# Patient Record
Sex: Male | Born: 1945 | Race: White | Hispanic: No | Marital: Single | Smoking: Former smoker
Health system: Southern US, Community
[De-identification: ages and names within clinical notes are randomized; demographics above are authoritative.]

## PROBLEM LIST (undated history)

## (undated) DIAGNOSIS — M1990 Unspecified osteoarthritis, unspecified site: Secondary | ICD-10-CM

## (undated) DIAGNOSIS — M545 Low back pain, unspecified: Secondary | ICD-10-CM

## (undated) DIAGNOSIS — R296 Repeated falls: Secondary | ICD-10-CM

## (undated) DIAGNOSIS — F329 Major depressive disorder, single episode, unspecified: Secondary | ICD-10-CM

## (undated) DIAGNOSIS — I2510 Atherosclerotic heart disease of native coronary artery without angina pectoris: Secondary | ICD-10-CM

## (undated) DIAGNOSIS — E119 Type 2 diabetes mellitus without complications: Secondary | ICD-10-CM

## (undated) DIAGNOSIS — I509 Heart failure, unspecified: Secondary | ICD-10-CM

## (undated) DIAGNOSIS — I639 Cerebral infarction, unspecified: Secondary | ICD-10-CM

## (undated) DIAGNOSIS — J189 Pneumonia, unspecified organism: Secondary | ICD-10-CM

## (undated) DIAGNOSIS — J449 Chronic obstructive pulmonary disease, unspecified: Secondary | ICD-10-CM

## (undated) DIAGNOSIS — I10 Essential (primary) hypertension: Secondary | ICD-10-CM

## (undated) DIAGNOSIS — Z87891 Personal history of nicotine dependence: Secondary | ICD-10-CM

## (undated) DIAGNOSIS — F1010 Alcohol abuse, uncomplicated: Secondary | ICD-10-CM

## (undated) DIAGNOSIS — I6529 Occlusion and stenosis of unspecified carotid artery: Secondary | ICD-10-CM

## (undated) DIAGNOSIS — K219 Gastro-esophageal reflux disease without esophagitis: Secondary | ICD-10-CM

## (undated) DIAGNOSIS — C50911 Malignant neoplasm of unspecified site of right female breast: Secondary | ICD-10-CM

## (undated) DIAGNOSIS — N289 Disorder of kidney and ureter, unspecified: Secondary | ICD-10-CM

## (undated) DIAGNOSIS — E785 Hyperlipidemia, unspecified: Secondary | ICD-10-CM

## (undated) DIAGNOSIS — E039 Hypothyroidism, unspecified: Secondary | ICD-10-CM

## (undated) DIAGNOSIS — Z9289 Personal history of other medical treatment: Secondary | ICD-10-CM

## (undated) DIAGNOSIS — IMO0001 Reserved for inherently not codable concepts without codable children: Secondary | ICD-10-CM

## (undated) DIAGNOSIS — Z9981 Dependence on supplemental oxygen: Secondary | ICD-10-CM

## (undated) DIAGNOSIS — G709 Myoneural disorder, unspecified: Secondary | ICD-10-CM

## (undated) DIAGNOSIS — K632 Fistula of intestine: Secondary | ICD-10-CM

## (undated) DIAGNOSIS — N281 Cyst of kidney, acquired: Secondary | ICD-10-CM

## (undated) DIAGNOSIS — I4819 Other persistent atrial fibrillation: Secondary | ICD-10-CM

## (undated) DIAGNOSIS — K5900 Constipation, unspecified: Secondary | ICD-10-CM

## (undated) DIAGNOSIS — I5032 Chronic diastolic (congestive) heart failure: Secondary | ICD-10-CM

## (undated) DIAGNOSIS — I272 Pulmonary hypertension, unspecified: Secondary | ICD-10-CM

## (undated) DIAGNOSIS — G4733 Obstructive sleep apnea (adult) (pediatric): Secondary | ICD-10-CM

## (undated) DIAGNOSIS — F32A Depression, unspecified: Secondary | ICD-10-CM

## (undated) DIAGNOSIS — M109 Gout, unspecified: Secondary | ICD-10-CM

## (undated) HISTORY — PX: CORONARY ARTERY BYPASS GRAFT: SHX141

## (undated) HISTORY — DX: Essential (primary) hypertension: I10

## (undated) HISTORY — DX: Low back pain: M54.5

## (undated) HISTORY — DX: Atherosclerotic heart disease of native coronary artery without angina pectoris: I25.10

## (undated) HISTORY — DX: Type 2 diabetes mellitus without complications: E11.9

## (undated) HISTORY — DX: Personal history of other medical treatment: Z92.89

## (undated) HISTORY — DX: Pulmonary hypertension, unspecified: I27.20

## (undated) HISTORY — DX: Obstructive sleep apnea (adult) (pediatric): G47.33

## (undated) HISTORY — DX: Hyperlipidemia, unspecified: E78.5

## (undated) HISTORY — DX: Occlusion and stenosis of unspecified carotid artery: I65.29

## (undated) HISTORY — PX: COLONOSCOPY: SHX174

## (undated) HISTORY — PX: EYE SURGERY: SHX253

## (undated) HISTORY — DX: Low back pain, unspecified: M54.50

## (undated) HISTORY — DX: Chronic obstructive pulmonary disease, unspecified: J44.9

## (undated) HISTORY — DX: Hypothyroidism, unspecified: E03.9

## (undated) HISTORY — PX: HERNIA REPAIR: SHX51

---

## 1999-11-14 ENCOUNTER — Inpatient Hospital Stay (HOSPITAL_COMMUNITY): Admission: EM | Admit: 1999-11-14 | Discharge: 1999-11-19 | Payer: Self-pay | Admitting: Internal Medicine

## 2000-08-17 ENCOUNTER — Encounter: Payer: Self-pay | Admitting: Emergency Medicine

## 2000-08-17 ENCOUNTER — Inpatient Hospital Stay (HOSPITAL_COMMUNITY): Admission: EM | Admit: 2000-08-17 | Discharge: 2000-08-25 | Payer: Self-pay | Admitting: Emergency Medicine

## 2000-08-18 ENCOUNTER — Encounter: Payer: Self-pay | Admitting: Surgery

## 2000-08-19 ENCOUNTER — Encounter: Payer: Self-pay | Admitting: Surgery

## 2000-08-20 ENCOUNTER — Encounter: Payer: Self-pay | Admitting: Surgery

## 2000-08-21 ENCOUNTER — Encounter: Payer: Self-pay | Admitting: Thoracic Surgery (Cardiothoracic Vascular Surgery)

## 2000-10-11 HISTORY — PX: CAROTID ENDARTERECTOMY: SUR193

## 2001-08-29 ENCOUNTER — Encounter: Payer: Self-pay | Admitting: Vascular Surgery

## 2001-08-31 ENCOUNTER — Encounter (INDEPENDENT_AMBULATORY_CARE_PROVIDER_SITE_OTHER): Payer: Self-pay | Admitting: *Deleted

## 2001-08-31 ENCOUNTER — Inpatient Hospital Stay (HOSPITAL_COMMUNITY): Admission: RE | Admit: 2001-08-31 | Discharge: 2001-09-01 | Payer: Self-pay | Admitting: Vascular Surgery

## 2003-02-07 ENCOUNTER — Emergency Department (HOSPITAL_COMMUNITY): Admission: EM | Admit: 2003-02-07 | Discharge: 2003-02-07 | Payer: Self-pay | Admitting: Emergency Medicine

## 2003-02-07 ENCOUNTER — Encounter: Payer: Self-pay | Admitting: Emergency Medicine

## 2005-06-10 ENCOUNTER — Ambulatory Visit: Payer: Self-pay | Admitting: Cardiology

## 2007-06-08 ENCOUNTER — Ambulatory Visit: Payer: Self-pay | Admitting: Cardiology

## 2008-06-04 ENCOUNTER — Ambulatory Visit: Payer: Self-pay | Admitting: Cardiology

## 2008-07-05 ENCOUNTER — Ambulatory Visit: Payer: Self-pay

## 2008-07-25 ENCOUNTER — Encounter: Admission: RE | Admit: 2008-07-25 | Discharge: 2008-07-25 | Payer: Self-pay | Admitting: Neurology

## 2008-07-25 ENCOUNTER — Ambulatory Visit: Payer: Self-pay | Admitting: Cardiology

## 2008-07-25 LAB — CONVERTED CEMR LAB
BUN: 38 mg/dL — ABNORMAL HIGH (ref 6–23)
CO2: 33 meq/L — ABNORMAL HIGH (ref 19–32)
Calcium: 9.4 mg/dL (ref 8.4–10.5)
Chloride: 95 meq/L — ABNORMAL LOW (ref 96–112)
Creatinine, Ser: 0.9 mg/dL (ref 0.4–1.5)
GFR calc Af Amer: 109 mL/min
GFR calc non Af Amer: 90 mL/min
Glucose, Bld: 180 mg/dL — ABNORMAL HIGH (ref 70–99)
Potassium: 4 meq/L (ref 3.5–5.1)
Sodium: 139 meq/L (ref 135–145)

## 2008-08-01 ENCOUNTER — Ambulatory Visit: Payer: Self-pay | Admitting: Cardiology

## 2008-11-20 ENCOUNTER — Ambulatory Visit: Payer: Self-pay | Admitting: Cardiovascular Disease

## 2008-11-20 ENCOUNTER — Ambulatory Visit: Payer: Self-pay | Admitting: Infectious Disease

## 2008-11-20 ENCOUNTER — Inpatient Hospital Stay (HOSPITAL_COMMUNITY): Admission: EM | Admit: 2008-11-20 | Discharge: 2008-11-25 | Payer: Self-pay | Admitting: Emergency Medicine

## 2008-11-20 ENCOUNTER — Ambulatory Visit: Payer: Self-pay | Admitting: Vascular Surgery

## 2008-11-21 ENCOUNTER — Encounter: Payer: Self-pay | Admitting: Infectious Disease

## 2009-06-20 DIAGNOSIS — I2510 Atherosclerotic heart disease of native coronary artery without angina pectoris: Secondary | ICD-10-CM

## 2009-06-20 DIAGNOSIS — E785 Hyperlipidemia, unspecified: Secondary | ICD-10-CM

## 2009-06-20 DIAGNOSIS — I6529 Occlusion and stenosis of unspecified carotid artery: Secondary | ICD-10-CM

## 2009-06-20 DIAGNOSIS — E1165 Type 2 diabetes mellitus with hyperglycemia: Secondary | ICD-10-CM

## 2009-06-20 DIAGNOSIS — E119 Type 2 diabetes mellitus without complications: Secondary | ICD-10-CM

## 2009-06-20 DIAGNOSIS — G4733 Obstructive sleep apnea (adult) (pediatric): Secondary | ICD-10-CM

## 2009-06-20 DIAGNOSIS — I10 Essential (primary) hypertension: Secondary | ICD-10-CM

## 2009-06-20 DIAGNOSIS — E1151 Type 2 diabetes mellitus with diabetic peripheral angiopathy without gangrene: Secondary | ICD-10-CM

## 2009-06-20 DIAGNOSIS — J4489 Other specified chronic obstructive pulmonary disease: Secondary | ICD-10-CM | POA: Insufficient documentation

## 2009-06-20 DIAGNOSIS — J449 Chronic obstructive pulmonary disease, unspecified: Secondary | ICD-10-CM

## 2009-06-20 DIAGNOSIS — I119 Hypertensive heart disease without heart failure: Secondary | ICD-10-CM

## 2009-06-20 HISTORY — DX: Type 2 diabetes mellitus without complications: E11.9

## 2009-06-20 HISTORY — DX: Other specified chronic obstructive pulmonary disease: J44.89

## 2009-06-20 HISTORY — DX: Obstructive sleep apnea (adult) (pediatric): G47.33

## 2009-06-20 HISTORY — DX: Chronic obstructive pulmonary disease, unspecified: J44.9

## 2009-06-20 HISTORY — DX: Hyperlipidemia, unspecified: E78.5

## 2009-06-20 HISTORY — DX: Atherosclerotic heart disease of native coronary artery without angina pectoris: I25.10

## 2009-06-20 HISTORY — DX: Occlusion and stenosis of unspecified carotid artery: I65.29

## 2009-06-20 HISTORY — DX: Essential (primary) hypertension: I10

## 2009-06-24 ENCOUNTER — Ambulatory Visit: Payer: Self-pay | Admitting: Cardiology

## 2009-07-01 ENCOUNTER — Telehealth (INDEPENDENT_AMBULATORY_CARE_PROVIDER_SITE_OTHER): Payer: Self-pay | Admitting: *Deleted

## 2009-07-02 ENCOUNTER — Encounter: Payer: Self-pay | Admitting: Cardiology

## 2009-07-02 ENCOUNTER — Ambulatory Visit: Payer: Self-pay

## 2009-07-08 ENCOUNTER — Ambulatory Visit: Payer: Self-pay

## 2009-07-08 ENCOUNTER — Encounter: Payer: Self-pay | Admitting: Cardiology

## 2009-07-08 ENCOUNTER — Ambulatory Visit: Payer: Self-pay | Admitting: Cardiology

## 2009-07-16 ENCOUNTER — Encounter (INDEPENDENT_AMBULATORY_CARE_PROVIDER_SITE_OTHER): Payer: Self-pay | Admitting: *Deleted

## 2009-08-26 ENCOUNTER — Ambulatory Visit: Payer: Self-pay | Admitting: Cardiology

## 2009-08-27 ENCOUNTER — Telehealth: Payer: Self-pay | Admitting: Cardiology

## 2009-09-11 ENCOUNTER — Ambulatory Visit: Payer: Self-pay | Admitting: Cardiology

## 2009-09-11 ENCOUNTER — Encounter: Payer: Self-pay | Admitting: Cardiology

## 2009-09-11 ENCOUNTER — Ambulatory Visit (HOSPITAL_COMMUNITY): Admission: RE | Admit: 2009-09-11 | Discharge: 2009-09-11 | Payer: Self-pay | Admitting: Cardiology

## 2009-09-11 ENCOUNTER — Ambulatory Visit: Payer: Self-pay

## 2009-09-19 ENCOUNTER — Encounter (INDEPENDENT_AMBULATORY_CARE_PROVIDER_SITE_OTHER): Payer: Self-pay | Admitting: *Deleted

## 2009-12-23 ENCOUNTER — Ambulatory Visit: Payer: Self-pay | Admitting: Cardiology

## 2009-12-23 DIAGNOSIS — I5032 Chronic diastolic (congestive) heart failure: Secondary | ICD-10-CM

## 2010-04-05 ENCOUNTER — Ambulatory Visit: Payer: Self-pay | Admitting: Surgery

## 2010-04-05 ENCOUNTER — Inpatient Hospital Stay (HOSPITAL_COMMUNITY): Admission: EM | Admit: 2010-04-05 | Discharge: 2010-04-09 | Payer: Self-pay | Admitting: Emergency Medicine

## 2010-04-06 ENCOUNTER — Encounter (INDEPENDENT_AMBULATORY_CARE_PROVIDER_SITE_OTHER): Payer: Self-pay | Admitting: Internal Medicine

## 2010-04-11 ENCOUNTER — Emergency Department (HOSPITAL_COMMUNITY): Admission: EM | Admit: 2010-04-11 | Discharge: 2010-04-11 | Payer: Self-pay | Admitting: Emergency Medicine

## 2010-05-26 ENCOUNTER — Ambulatory Visit: Payer: Self-pay | Admitting: Cardiology

## 2010-06-25 ENCOUNTER — Inpatient Hospital Stay (HOSPITAL_COMMUNITY): Admission: AD | Admit: 2010-06-25 | Discharge: 2010-06-28 | Payer: Self-pay | Admitting: Internal Medicine

## 2010-06-25 ENCOUNTER — Ambulatory Visit: Payer: Self-pay | Admitting: Internal Medicine

## 2010-07-07 ENCOUNTER — Encounter: Payer: Self-pay | Admitting: Cardiology

## 2010-07-07 ENCOUNTER — Ambulatory Visit: Payer: Self-pay

## 2010-11-08 LAB — CONVERTED CEMR LAB
BUN: 48 mg/dL — ABNORMAL HIGH (ref 6–23)
CO2: 34 meq/L — ABNORMAL HIGH (ref 19–32)
Creatinine, Ser: 1.1 mg/dL (ref 0.4–1.5)
GFR calc non Af Amer: 71.17 mL/min (ref >60)
Glucose, Bld: 224 mg/dL — ABNORMAL HIGH (ref 70–99)

## 2010-11-10 NOTE — Assessment & Plan Note (Signed)
Summary: 4 MONTH   Primary Provider:  dr [name]  CC:  sob.  History of Present Illness: Patient is 65 years old and return for management of CAD and diastolic heart failure. He had bypass surgery in 2001. I saw him last year with new onset atrial fibrillation. We could him with rate control without Coumadin since he had a history of alcohol consumption. He is [place] score 2 with hypertension and diabetes. He may be [place] score 3 with diastolic CHF. He had a negative Myoview in 2010. His normal LV function with an ejection fraction of 55-60%.  He's been doing fair. He's had no chest pain and no palpitations. He has had some increased edema of lower extremities and he says he gives out very easily with walking. He didn't feel he could get up to the table today to be examined.  His wife died a couple of years ago and is now living by himself.  His other medical problems include diabetes, hypertension, hyperlipidemia, and obstructive sleep apnea. He also has obesity and he is status post left carotid endarterectomy.  Current Medications (verified): 1)  Aspirin Ec 325 Mg Tbec (Aspirin) .... Take One Tablet By Mouth Daily 2)  Prilosec 20 Mg Cpdr (Omeprazole) .Marland Kitchen.. 1 Tab Once Daily 3)  Prozac 20 Mg Caps (Fluoxetine Hcl) .Marland Kitchen.. 1 Tab Once Daily 4)  Crestor 20 Mg Tabs (Rosuvastatin Calcium) .... Take One Tablet By Mouth Daily. 5)  Glipizide 10 Mg Tabs (Glipizide) .Marland Kitchen.. 1 Tab By Mouth Two Times A Day 6)  Metoprolol Tartrate 50 Mg Tabs (Metoprolol Tartrate) .Marland Kitchen.. 1 Tab Two Times A Day 7)  Synthroid 150 Mcg Tabs (Levothyroxine Sodium) .Marland Kitchen.. 1 Tab Once Daily 8)  Combivent 103-18 Mcg/act Aero (Ipratropium-Albuterol) .Marland Kitchen.. 1-2 Vpuffs Up To Four Times A Day 9)  Potassium Chloride Crys Cr 20 Meq Cr-Tabs (Potassium Chloride Crys Cr) .... Take One Tablet By Mouth Daily 10)  Zaroxolyn 5 Mg Tabs (Metolazone) .... Two Tabs By Mouth Every Morning 11)  Spironolactone 50 Mg Tabs (Spironolactone) .... One Tab By Mouth  Once Daily 12)  Allopurinol 300 Mg Tabs (Allopurinol) .... One Tab By Mouth Once Daily 13)  Gabapentin 100 Mg Caps (Gabapentin) .Marland Kitchen.. 1 Tab By Mouth Three Times A Day 14)  Magnesium Oxide 400 Mg Tabs (Magnesium Oxide) .... One Tab By Mouth Once Daily 15)  Furosemide 80 Mg Tabs (Furosemide) .... Take Two  Tablet By Mouth Daily. 16)  Aerobid 250 Mcg/act Aers (Flunisolide) .... One Puff Two Times A Day  Allergies: 1)  ! * Mycians  Past History:  Past Medical History: Reviewed history from 06/23/2009 and no changes required.  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 Coronary artery disease status post coronary artery bypass graft     during 2001, stable. 2. Good left ventricular function. 3. Excess weight, but improvement with a loss of 48 pounds. 4. Non-insulin-dependent diabetes. 5. Hypertension. 6. Hyperlipidemia with intolerance to statins. 7. Obstructive sleep apnea. 8. Hypothyroidism, treated. 9. Low back pain with radicular pain and weakness in the left lower     extremity. 10.Status post left carotid endarterectomy, 2002.   Review of Systems       ROS is negative except as outlined in HPI.   Vital Signs:  Patient profile:   65 year old male Height:      70 inches Weight:      305 pounds BMI:  43.92 Pulse rate:   71 / minute Resp:     18 per minute BP sitting:   139 / 84  (left arm)  Vitals Entered By: Kem Parkinson (December 23, 2009 1:49 PM)  Physical Exam  Additional Exam:  Gen. Well-nourished, in no distress   Neck: JVP just above the clavicle, thyroid not enlarged, no carotid bruits Lungs: No tachypnea, clear without rales, rhonchi or wheezes Cardiovascular: Rhythm irregular, PMI not displaced,  heart sounds  normal, no murmurs or gallops, 2-3+ bilateral peripheral edema with stasis changes,  pulses normal in all 4 extremities. Abdomen: BS normal, abdomen soft and non-tender without masses or organomegaly, no hepatosplenomegaly. MS: No deformities, no cyanosis or clubbing   Neuro:  No focal sns   Skin:  no lesions    Impression & Recommendations:  Problem # 1:  CAD (ICD-414.00)  Hhad previous bypass surgery in 2001 and had a negative Myoview scan in 2010 this problem appears stable. His updated medication list for this problem includes:    Aspirin Ec 325 Mg Tbec (Aspirin) .Marland Kitchen... Take one tablet by mouth daily    Metoprolol Tartrate 50 Mg Tabs (Metoprolol tartrate) .Marland Kitchen... 1 tab two times a day  Orders: EKG w/ Interpretation (93000) TLB-BMP (Basic Metabolic Panel-BMET) (80048-METABOL)  Problem # 2:  ATRIAL FIBRILLATION (ICD-427.31)  He has now chronic atrial fibrillation and is treated with rate control only. His rate control appears good. He is not on Coumadin because of concerns about compliance with a history of alcohol consumption. He is [place] score 2-3 His updated medication list for this problem includes:    Aspirin Ec 325 Mg Tbec (Aspirin) .Marland Kitchen... Take one tablet by mouth daily    Metoprolol Tartrate 50 Mg Tabs (Metoprolol tartrate) .Marland Kitchen... 1 tab two times a day  His updated medication list for this problem includes:    Aspirin Ec 325 Mg Tbec (Aspirin) .Marland Kitchen... Take one tablet by mouth daily    Metoprolol Tartrate 50 Mg Tabs (Metoprolol tartrate) .Marland Kitchen... 1 tab two times a day  Orders: EKG w/ Interpretation (93000) TLB-BMP (Basic Metabolic Panel-BMET) (80048-METABOL)  Problem # 3:  HYPERTENSION (ICD-401.9)  This is fairly well controlled on current medications. His updated medication list for this problem includes:    Aspirin Ec 325 Mg Tbec (Aspirin) .Marland Kitchen... Take one tablet by mouth daily    Metoprolol Tartrate 50 Mg Tabs (Metoprolol tartrate) .Marland Kitchen... 1 tab two times a day    Zaroxolyn 5 Mg Tabs (Metolazone) .Marland Kitchen..Marland Kitchen Two tabs by mouth every morning    Spironolactone 50 Mg Tabs  (Spironolactone) ..... One tab by mouth once daily    Furosemide 80 Mg Tabs (Furosemide) .Marland Kitchen... Take two tablets every morning and one tablet every evening.  His updated medication list for this problem includes:    Aspirin Ec 325 Mg Tbec (Aspirin) .Marland Kitchen... Take one tablet by mouth daily    Metoprolol Tartrate 50 Mg Tabs (Metoprolol tartrate) .Marland Kitchen... 1 tab two times a day    Zaroxolyn 5 Mg Tabs (Metolazone) .Marland Kitchen..Marland Kitchen Two tabs by mouth every morning    Spironolactone 50 Mg Tabs (Spironolactone) ..... One tab by mouth once daily    Furosemide 80 Mg Tabs (Furosemide) .Marland Kitchen... Take two tablets every morning and one tablet every evening.  Orders: EKG w/ Interpretation (93000) TLB-BMP (Basic Metabolic Panel-BMET) (80048-METABOL)  Problem # 4:  DIASTOLIC HEART FAILURE, CHRONIC (ICD-428.32)  He has some element of diastolic heart failure with borderline JVD and edema. I think most of his edema is  related to venous insufficiency. We will increase his dose tonight from 80 mg 2 tablets in the morning to 80 mg 2 tablets the morning and one tablet in the afternoon. We'll get a BMP today and a repeat one in a week and salicylate. His updated medication list for this problem includes:    Aspirin Ec 325 Mg Tbec (Aspirin) .Marland Kitchen... Take one tablet by mouth daily    Metoprolol Tartrate 50 Mg Tabs (Metoprolol tartrate) .Marland Kitchen... 1 tab two times a day    Zaroxolyn 5 Mg Tabs (Metolazone) .Marland Kitchen..Marland Kitchen Two tabs by mouth every morning    Spironolactone 50 Mg Tabs (Spironolactone) ..... One tab by mouth once daily    Furosemide 80 Mg Tabs (Furosemide) .Marland Kitchen... Take two tablets every morning and one tablet every evening.  Orders: EKG w/ Interpretation (93000) TLB-BMP (Basic Metabolic Panel-BMET) (80048-METABOL)  Patient Instructions: 1)  Your physician recommends that you have lab work today: bmet (427.31; 414.01;428.33;402.10) 2)  You need to have repeat labwork in one week at your primary care in [facility]- bmet  (427.31;414.01;428.33;402.10) 3)  Your physician has recommended you make the following change in your medication: 1) Increase furosemide to 80mg  two tablets every morning and one tablet every evening. 4)  Your physician wants you to follow-up in: 6 months.  You will receive a reminder letter in the mail two months in advance. If you don't receive a letter, please call our office to schedule the follow-up appointment.

## 2010-11-10 NOTE — Assessment & Plan Note (Signed)
Summary: f37m   Visit Type:  Follow-up Primary Provider:  dr [name]  CC:  The pt was admitted to [facility] about 2 months ago for chills/ headaches/ and cellulitis. He has had decreased edema..  History of Present Illness: The patient is 65 years old and returns for management of CAD. He had bypass surgery in 2001. He had a negative Myoview scan in 2010. In 2010 he developed paroxysmal fibrillation and in persistent atrial fibrillation. He was not felt to be a Coumadin candidate because of alcohol consumption. He did have a [place] score 3.  About 6 weeks ago he was hospitalized with cellulitis and what sounds like sepsis. He also had a lot of edema and shortness of breath which was thought to be related to multiple factors including COPD venous insufficiency and diastolic CHF. He improved with treatment. He was on Zaroxolyn at home but his weight has come down and he is now off Zaroxolyn and maintaining his weight just on furosemide. His creatinine in the hospital was 1.2-1.6. He said his head followup readings at Dr. Bobbye Charleston office.  Current Medications (verified): 1)  Aspirin Ec 325 Mg Tbec (Aspirin) .... Take One Tablet By Mouth Daily 2)  Prilosec 20 Mg Cpdr (Omeprazole) .Marland Kitchen.. 1 Tab Once Daily 3)  Prozac 20 Mg Caps (Fluoxetine Hcl) .Marland Kitchen.. 1 Tab Once Daily 4)  Crestor 20 Mg Tabs (Rosuvastatin Calcium) .... Take One Tablet By Mouth Daily. 5)  Glipizide 10 Mg Tabs (Glipizide) .Marland Kitchen.. 1 Tab By Mouth Two Times A Day 6)  Metoprolol Tartrate 50 Mg Tabs (Metoprolol Tartrate) .Marland Kitchen.. 1 Tab Two Times A Day 7)  Synthroid 150 Mcg Tabs (Levothyroxine Sodium) .Marland Kitchen.. 1 Tab Once Daily 8)  Albuterol Nebs .... Use One Nebulizer Treatment Once Daily 9)  Potassium Chloride Crys Cr 20 Meq Cr-Tabs (Potassium Chloride Crys Cr) .... Take One Tablet By Mouth Daily 10)  Spironolactone 50 Mg Tabs (Spironolactone) .... One Tab By Mouth Once Daily 11)  Allopurinol 300 Mg Tabs (Allopurinol) .... One Tab By Mouth Once Daily 12)   Gabapentin 100 Mg Caps (Gabapentin) .Marland Kitchen.. 1 Tab By Mouth Three Times A Day 13)  Magnesium Oxide 400 Mg Tabs (Magnesium Oxide) .... One Tab By Mouth Once Daily 14)  Furosemide 80 Mg Tabs (Furosemide) .... Take Two Tablets Every Morning 15)  Zaroxolyn 5 Mg Tabs (Metolazone) .... One Tab By Mouth Once Daily As Needed  Allergies (verified): 1)  ! * Mycians  Past History:  Past Medical History: Reviewed history from 06/23/2009 and no changes required.  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 Coronary artery disease status post coronary artery bypass graft     during 2001, stable. 2. Good left ventricular function. 3. Excess weight, but improvement with a loss of 48 pounds. 4. Non-insulin-dependent diabetes. 5. Hypertension. 6. Hyperlipidemia with intolerance to statins. 7. Obstructive sleep apnea. 8. Hypothyroidism, treated. 9. Low back pain with radicular pain and weakness in the left lower     extremity. 10.Status post left carotid endarterectomy, 2002.   Review of Systems       ROS is negative except as outlined in HPI.   Vital Signs:  Patient profile:   65 year old male Height:      70 inches Weight:      298.25 pounds Pulse rate:   66 / minute BP sitting:   101 / 54  (left arm) Cuff size:  large  Vitals Entered By: Burnett Kanaris, CNA (May 26, 2010 2:42 PM)  Physical Exam  Additional Exam:  Gen. Well-nourished, in no distress   Neck: No JVD, thyroid not enlarged, right carotid bruits Lungs: No tachypnea, clear without rales, rhonchi or wheezes Cardiovascular: Rhythm regular, PMI not displaced,  heart sounds  normal, no murmurs or gallops, trace bilateral peripheral edema with stasis changes, pulses normal in all 4 extremities. Abdomen: BS normal, abdomen soft and non-tender without masses or  organomegaly, no hepatosplenomegaly. MS: No deformities, no cyanosis or clubbing   Neuro:  No focal sns   Skin:  no lesions    Impression & Recommendations:  Problem # 1:  DIASTOLIC HEART FAILURE, CHRONIC (ICD-428.32) He appears euvolemic today we will continue his current therapy. His primary care physician has checked his renal function. The following medications were removed from the medication list:    Zaroxolyn 5 Mg Tabs (Metolazone) .Marland Kitchen..Marland Kitchen Two tabs by mouth every morning His updated medication list for this problem includes:    Aspirin Ec 325 Mg Tbec (Aspirin) .Marland Kitchen... Take one tablet by mouth daily    Metoprolol Tartrate 50 Mg Tabs (Metoprolol tartrate) .Marland Kitchen... 1 tab two times a day    Spironolactone 50 Mg Tabs (Spironolactone) ..... One tab by mouth once daily    Furosemide 80 Mg Tabs (Furosemide) .Marland Kitchen... Take two tablets every morning    Zaroxolyn 5 Mg Tabs (Metolazone) ..... One tab by mouth once daily as needed  Problem # 2:  ATRIAL FIBRILLATION (ICD-427.31)  He has chronic atrial fibrillation. He is [place] score 3 with hypertension, diabetes, and CHF. We have not put him on Coumadin because of a history of alcohol. He says he is off alcohol now. We discussed the possibility of treating him with Coumadin but he was unable to arrive at a decision today. If [name] feels he can reliably control his INR is and if he stays off alcohol, I think there would be benefit using Coumadin to reduce his stroke risk. His updated medication list for this problem includes:    Aspirin Ec 325 Mg Tbec (Aspirin) .Marland Kitchen... Take one tablet by mouth daily    Metoprolol Tartrate 50 Mg Tabs (Metoprolol tartrate) .Marland Kitchen... 1 tab two times a day  Orders: EKG w/ Interpretation (93000)  Problem # 3:  CAD (ICD-414.00)  He had remote bypass surgery and had a negative Myoview scan last year. He's had no chest pain this problem appears stable. His updated medication list for this problem includes:    Aspirin Ec 325 Mg  Tbec (Aspirin) .Marland Kitchen... Take one tablet by mouth daily    Metoprolol Tartrate 50 Mg Tabs (Metoprolol tartrate) .Marland Kitchen... 1 tab two times a day  Orders: EKG w/ Interpretation (93000)  Problem # 4:  CAROTID STENOSIS (ICD-433.10) He's had previous left carotid endarterectomy and has a right bruit. He is under surveillance with carotid duplex studies.  his last study in September of 2010 showed 46% stenosis on the right and 0-39 on the left. His updated medication list for this problem includes:    Aspirin Ec 325 Mg Tbec (Aspirin) .Marland Kitchen... Take one tablet by mouth daily  Patient Instructions: 1)  Your physician recommends that you continue on your current medications as directed. Please refer to the Current Medication list given to you today. 2)  Your physician wants you to follow-up in: 6 months with Dr. Clifton James.   You will receive a reminder letter in the mail two months in advance. If you  don't receive a letter, please call our office to schedule the follow-up appointment.

## 2010-11-10 NOTE — Miscellaneous (Signed)
Summary: Orders Update  Clinical Lists Changes  Orders: Added new Test order of Carotid Duplex (Carotid Duplex) - Signed 

## 2010-12-24 LAB — DIFFERENTIAL
Basophils Absolute: 0 10*3/uL (ref 0.0–0.1)
Eosinophils Relative: 1 % (ref 0–5)
Lymphs Abs: 1 10*3/uL (ref 0.7–4.0)
Neutrophils Relative %: 81 % — ABNORMAL HIGH (ref 43–77)

## 2010-12-24 LAB — BASIC METABOLIC PANEL
BUN: 66 mg/dL — ABNORMAL HIGH (ref 6–23)
CO2: 24 mEq/L (ref 19–32)
CO2: 31 mEq/L (ref 19–32)
CO2: 33 mEq/L — ABNORMAL HIGH (ref 19–32)
Calcium: 9.4 mg/dL (ref 8.4–10.5)
Chloride: 91 mEq/L — ABNORMAL LOW (ref 96–112)
Chloride: 92 mEq/L — ABNORMAL LOW (ref 96–112)
Chloride: 92 mEq/L — ABNORMAL LOW (ref 96–112)
Creatinine, Ser: 1.3 mg/dL (ref 0.4–1.5)
Creatinine, Ser: 1.4 mg/dL (ref 0.4–1.5)
GFR calc Af Amer: >60 mL/min (ref >60)
GFR calc Af Amer: >60 mL/min (ref >60)
Glucose, Bld: 195 mg/dL — ABNORMAL HIGH (ref 70–99)
Potassium: 4 mEq/L (ref 3.5–5.1)
Potassium: 4.8 mEq/L (ref 3.5–5.1)
Sodium: 129 mEq/L — ABNORMAL LOW (ref 135–145)
Sodium: 137 mEq/L (ref 135–145)

## 2010-12-24 LAB — GLUCOSE, CAPILLARY
Glucose-Capillary: 168 mg/dL — ABNORMAL HIGH (ref 70–99)
Glucose-Capillary: 180 mg/dL — ABNORMAL HIGH (ref 70–99)
Glucose-Capillary: 194 mg/dL — ABNORMAL HIGH (ref 70–99)
Glucose-Capillary: 202 mg/dL — ABNORMAL HIGH (ref 70–99)
Glucose-Capillary: 222 mg/dL — ABNORMAL HIGH (ref 70–99)
Glucose-Capillary: 235 mg/dL — ABNORMAL HIGH (ref 70–99)
Glucose-Capillary: 298 mg/dL — ABNORMAL HIGH (ref 70–99)

## 2010-12-24 LAB — CULTURE, BLOOD (ROUTINE X 2)
Culture  Setup Time: 201109160136
Culture: NO GROWTH

## 2010-12-24 LAB — CBC
Hemoglobin: 12.4 g/dL — ABNORMAL LOW (ref 13.0–17.0)
MCH: 31 pg (ref 26.0–34.0)
MCHC: 32 g/dL (ref 30.0–36.0)
MCHC: 32.2 g/dL (ref 30.0–36.0)
MCV: 96.8 fL (ref 78.0–100.0)
RBC: 3.75 MIL/uL — ABNORMAL LOW (ref 4.22–5.81)
RBC: 4 MIL/uL — ABNORMAL LOW (ref 4.22–5.81)
RDW: 15.5 % (ref 11.5–15.5)

## 2010-12-24 LAB — COMPREHENSIVE METABOLIC PANEL
ALT: 25 U/L (ref 0–53)
AST: 30 U/L (ref 0–37)
Alkaline Phosphatase: 63 U/L (ref 39–117)
Calcium: 9.2 mg/dL (ref 8.4–10.5)
GFR calc Af Amer: 58 mL/min — ABNORMAL LOW (ref >60)

## 2010-12-24 LAB — LIPID PANEL
HDL: 24 mg/dL — ABNORMAL LOW (ref >39)
Total CHOL/HDL Ratio: 6 RATIO
VLDL: 30 mg/dL (ref 0–40)

## 2010-12-24 LAB — HEMOGLOBIN A1C: Mean Plasma Glucose: 160 mg/dL — ABNORMAL HIGH (ref <117)

## 2010-12-24 LAB — VANCOMYCIN, TROUGH: Vancomycin Tr: 24.5 ug/mL — ABNORMAL HIGH (ref 10.0–20.0)

## 2010-12-27 LAB — GLUCOSE, CAPILLARY: Glucose-Capillary: 127 mg/dL — ABNORMAL HIGH (ref 70–99)

## 2010-12-27 LAB — DIFFERENTIAL
Basophils Absolute: 0 10*3/uL (ref 0.0–0.1)
Basophils Relative: 0 % (ref 0–1)
Neutro Abs: 6.6 10*3/uL (ref 1.7–7.7)
Neutrophils Relative %: 72 % (ref 43–77)

## 2010-12-27 LAB — BASIC METABOLIC PANEL
BUN: 29 mg/dL — ABNORMAL HIGH (ref 6–23)
Calcium: 9.5 mg/dL (ref 8.4–10.5)
GFR calc non Af Amer: 59 mL/min — ABNORMAL LOW (ref >60)
Glucose, Bld: 132 mg/dL — ABNORMAL HIGH (ref 70–99)
Sodium: 137 mEq/L (ref 135–145)

## 2010-12-27 LAB — CBC
Hemoglobin: 12.6 g/dL — ABNORMAL LOW (ref 13.0–17.0)
MCHC: 33.4 g/dL (ref 30.0–36.0)
RDW: 15.6 % — ABNORMAL HIGH (ref 11.5–15.5)
WBC: 9.2 10*3/uL (ref 4.0–10.5)

## 2010-12-28 LAB — COMPREHENSIVE METABOLIC PANEL
AST: 30 U/L (ref 0–37)
AST: 33 U/L (ref 0–37)
Albumin: 2.8 g/dL — ABNORMAL LOW (ref 3.5–5.2)
Albumin: 3.1 g/dL — ABNORMAL LOW (ref 3.5–5.2)
CO2: 27 mEq/L (ref 19–32)
Calcium: 9.1 mg/dL (ref 8.4–10.5)
Calcium: 9.7 mg/dL (ref 8.4–10.5)
Chloride: 93 mEq/L — ABNORMAL LOW (ref 96–112)
Creatinine, Ser: 1.27 mg/dL (ref 0.4–1.5)
Creatinine, Ser: 1.65 mg/dL — ABNORMAL HIGH (ref 0.4–1.5)
GFR calc Af Amer: 51 mL/min — ABNORMAL LOW (ref >60)
GFR calc Af Amer: >60 mL/min (ref >60)
GFR calc non Af Amer: 57 mL/min — ABNORMAL LOW (ref >60)
Sodium: 133 mEq/L — ABNORMAL LOW (ref 135–145)
Total Bilirubin: 0.8 mg/dL (ref 0.3–1.2)

## 2010-12-28 LAB — TROPONIN I: Troponin I: 0.02 ng/mL (ref 0.00–0.06)

## 2010-12-28 LAB — GLUCOSE, CAPILLARY
Glucose-Capillary: 107 mg/dL — ABNORMAL HIGH (ref 70–99)
Glucose-Capillary: 127 mg/dL — ABNORMAL HIGH (ref 70–99)
Glucose-Capillary: 139 mg/dL — ABNORMAL HIGH (ref 70–99)
Glucose-Capillary: 150 mg/dL — ABNORMAL HIGH (ref 70–99)
Glucose-Capillary: 163 mg/dL — ABNORMAL HIGH (ref 70–99)
Glucose-Capillary: 181 mg/dL — ABNORMAL HIGH (ref 70–99)
Glucose-Capillary: 183 mg/dL — ABNORMAL HIGH (ref 70–99)
Glucose-Capillary: 192 mg/dL — ABNORMAL HIGH (ref 70–99)
Glucose-Capillary: 222 mg/dL — ABNORMAL HIGH (ref 70–99)
Glucose-Capillary: 279 mg/dL — ABNORMAL HIGH (ref 70–99)
Glucose-Capillary: 295 mg/dL — ABNORMAL HIGH (ref 70–99)

## 2010-12-28 LAB — CK TOTAL AND CKMB (NOT AT ARMC)
CK, MB: 3 ng/mL (ref 0.3–4.0)
CK, MB: 3.3 ng/mL (ref 0.3–4.0)
Relative Index: 3.3 — ABNORMAL HIGH (ref 0.0–2.5)
Relative Index: INVALID (ref 0.0–2.5)
Total CK: 101 U/L (ref 7–232)
Total CK: 73 U/L (ref 7–232)

## 2010-12-28 LAB — BASIC METABOLIC PANEL
BUN: 29 mg/dL — ABNORMAL HIGH (ref 6–23)
BUN: 47 mg/dL — ABNORMAL HIGH (ref 6–23)
CO2: 29 mEq/L (ref 19–32)
Calcium: 8.8 mg/dL (ref 8.4–10.5)
Chloride: 100 mEq/L (ref 96–112)
Creatinine, Ser: 1.04 mg/dL (ref 0.4–1.5)
GFR calc Af Amer: >60 mL/min (ref >60)
GFR calc non Af Amer: 52 mL/min — ABNORMAL LOW (ref >60)
Glucose, Bld: 149 mg/dL — ABNORMAL HIGH (ref 70–99)
Glucose, Bld: 210 mg/dL — ABNORMAL HIGH (ref 70–99)
Potassium: 3.6 mEq/L (ref 3.5–5.1)
Potassium: 4 mEq/L (ref 3.5–5.1)
Sodium: 138 mEq/L (ref 135–145)

## 2010-12-28 LAB — CBC
HCT: 34 % — ABNORMAL LOW (ref 39.0–52.0)
HCT: 36.9 % — ABNORMAL LOW (ref 39.0–52.0)
HCT: 37.5 % — ABNORMAL LOW (ref 39.0–52.0)
Hemoglobin: 11.4 g/dL — ABNORMAL LOW (ref 13.0–17.0)
Hemoglobin: 11.5 g/dL — ABNORMAL LOW (ref 13.0–17.0)
MCH: 33 pg (ref 26.0–34.0)
MCH: 33.2 pg (ref 26.0–34.0)
MCH: 33.3 pg (ref 26.0–34.0)
MCH: 33.5 pg (ref 26.0–34.0)
MCHC: 32.6 g/dL (ref 30.0–36.0)
MCHC: 33.2 g/dL (ref 30.0–36.0)
MCHC: 33.6 g/dL (ref 30.0–36.0)
MCV: 100 fL (ref 78.0–100.0)
Platelets: 126 10*3/uL — ABNORMAL LOW (ref 150–400)
Platelets: 129 10*3/uL — ABNORMAL LOW (ref 150–400)
Platelets: 134 10*3/uL — ABNORMAL LOW (ref 150–400)
Platelets: 136 10*3/uL — ABNORMAL LOW (ref 150–400)
RBC: 3.4 MIL/uL — ABNORMAL LOW (ref 4.22–5.81)
RBC: 3.43 MIL/uL — ABNORMAL LOW (ref 4.22–5.81)
RBC: 3.5 MIL/uL — ABNORMAL LOW (ref 4.22–5.81)
RBC: 3.73 MIL/uL — ABNORMAL LOW (ref 4.22–5.81)
RBC: 4.06 MIL/uL — ABNORMAL LOW (ref 4.22–5.81)
RDW: 15.2 % (ref 11.5–15.5)
RDW: 15.4 % (ref 11.5–15.5)
RDW: 15.4 % (ref 11.5–15.5)
WBC: 25 10*3/uL — ABNORMAL HIGH (ref 4.0–10.5)
WBC: 27.6 10*3/uL — ABNORMAL HIGH (ref 4.0–10.5)
WBC: 7.2 10*3/uL (ref 4.0–10.5)
WBC: 7.7 10*3/uL (ref 4.0–10.5)

## 2010-12-28 LAB — DIFFERENTIAL
Eosinophils Relative: 0 % (ref 0–5)
Eosinophils Relative: 0 % (ref 0–5)
Lymphs Abs: 0.5 10*3/uL — ABNORMAL LOW (ref 0.7–4.0)
Lymphs Abs: 1.1 10*3/uL (ref 0.7–4.0)
Monocytes Relative: 5 % (ref 3–12)
Monocytes Relative: 6 % (ref 3–12)
Neutro Abs: 25.1 10*3/uL — ABNORMAL HIGH (ref 1.7–7.7)

## 2010-12-28 LAB — CULTURE, BLOOD (ROUTINE X 2): Culture: NO GROWTH

## 2010-12-28 LAB — LIPID PANEL
Cholesterol: 114 mg/dL (ref 0–200)
HDL: 38 mg/dL — ABNORMAL LOW (ref >39)
LDL Cholesterol: 51 mg/dL (ref 0–99)
Total CHOL/HDL Ratio: 3 RATIO

## 2010-12-28 LAB — URINALYSIS, ROUTINE W REFLEX MICROSCOPIC
Leukocytes, UA: NEGATIVE
Nitrite: NEGATIVE
Protein, ur: >300 mg/dL — AB
Specific Gravity, Urine: 1.017 (ref 1.005–1.030)
Urobilinogen, UA: 1 mg/dL (ref 0.0–1.0)

## 2010-12-28 LAB — D-DIMER, QUANTITATIVE: D-Dimer, Quant: 0.42 ug/mL-FEU (ref 0.00–0.48)

## 2010-12-28 LAB — TSH: TSH: 1.472 u[IU]/mL (ref 0.350–4.500)

## 2010-12-28 LAB — APTT: aPTT: 26 seconds (ref 24–37)

## 2010-12-28 LAB — ETHANOL: Alcohol, Ethyl (B): <5 mg/dL (ref 0–10)

## 2010-12-28 LAB — URINE MICROSCOPIC-ADD ON

## 2011-01-13 ENCOUNTER — Encounter (HOSPITAL_BASED_OUTPATIENT_CLINIC_OR_DEPARTMENT_OTHER): Payer: Self-pay

## 2011-01-15 ENCOUNTER — Encounter: Payer: Self-pay | Admitting: Cardiovascular Disease

## 2011-01-18 ENCOUNTER — Ambulatory Visit (HOSPITAL_BASED_OUTPATIENT_CLINIC_OR_DEPARTMENT_OTHER): Payer: No Typology Code available for payment source

## 2011-01-19 ENCOUNTER — Ambulatory Visit: Payer: Self-pay | Admitting: Cardiovascular Disease

## 2011-01-26 LAB — DIFFERENTIAL
Basophils Absolute: 0 10*3/uL (ref 0.0–0.1)
Basophils Absolute: 0 10*3/uL (ref 0.0–0.1)
Basophils Relative: 0 % (ref 0–1)
Basophils Relative: 0 % (ref 0–1)
Lymphocytes Relative: 15 % (ref 12–46)
Neutro Abs: 6.9 10*3/uL (ref 1.7–7.7)
Neutro Abs: 7 10*3/uL (ref 1.7–7.7)
Neutrophils Relative %: 69 % (ref 43–77)
Neutrophils Relative %: 74 % (ref 43–77)

## 2011-01-26 LAB — GLUCOSE, CAPILLARY
Glucose-Capillary: 193 mg/dL — ABNORMAL HIGH (ref 70–99)
Glucose-Capillary: 195 mg/dL — ABNORMAL HIGH (ref 70–99)
Glucose-Capillary: 206 mg/dL — ABNORMAL HIGH (ref 70–99)
Glucose-Capillary: 217 mg/dL — ABNORMAL HIGH (ref 70–99)
Glucose-Capillary: 226 mg/dL — ABNORMAL HIGH (ref 70–99)
Glucose-Capillary: 227 mg/dL — ABNORMAL HIGH (ref 70–99)
Glucose-Capillary: 232 mg/dL — ABNORMAL HIGH (ref 70–99)
Glucose-Capillary: 233 mg/dL — ABNORMAL HIGH (ref 70–99)
Glucose-Capillary: 277 mg/dL — ABNORMAL HIGH (ref 70–99)

## 2011-01-26 LAB — COMPREHENSIVE METABOLIC PANEL
ALT: 47 U/L (ref 0–53)
AST: 35 U/L (ref 0–37)
Albumin: 2.7 g/dL — ABNORMAL LOW (ref 3.5–5.2)
BUN: 44 mg/dL — ABNORMAL HIGH (ref 6–23)
Chloride: 91 mEq/L — ABNORMAL LOW (ref 96–112)
Creatinine, Ser: 1.04 mg/dL (ref 0.4–1.5)
GFR calc Af Amer: >60 mL/min (ref >60)
GFR calc non Af Amer: >60 mL/min (ref >60)
Potassium: 3.9 mEq/L (ref 3.5–5.1)
Total Protein: 7.2 g/dL (ref 6.0–8.3)

## 2011-01-26 LAB — CBC
HCT: 36.8 % — ABNORMAL LOW (ref 39.0–52.0)
HCT: 37.8 % — ABNORMAL LOW (ref 39.0–52.0)
Hemoglobin: 12.4 g/dL — ABNORMAL LOW (ref 13.0–17.0)
Hemoglobin: 12.8 g/dL — ABNORMAL LOW (ref 13.0–17.0)
MCHC: 33.4 g/dL (ref 30.0–36.0)
MCHC: 33.6 g/dL (ref 30.0–36.0)
MCHC: 33.8 g/dL (ref 30.0–36.0)
Platelets: 154 10*3/uL (ref 150–400)
RBC: 3.75 MIL/uL — ABNORMAL LOW (ref 4.22–5.81)
RBC: 3.85 MIL/uL — ABNORMAL LOW (ref 4.22–5.81)
RDW: 15 % (ref 11.5–15.5)
RDW: 15.1 % (ref 11.5–15.5)
RDW: 15.1 % (ref 11.5–15.5)
RDW: 15.2 % (ref 11.5–15.5)
RDW: 15.2 % (ref 11.5–15.5)
WBC: 6.5 10*3/uL (ref 4.0–10.5)

## 2011-01-26 LAB — SEDIMENTATION RATE: Sed Rate: 37 mm/hr — ABNORMAL HIGH (ref 0–16)

## 2011-01-26 LAB — CULTURE, BLOOD (ROUTINE X 2)

## 2011-01-26 LAB — BASIC METABOLIC PANEL
BUN: 20 mg/dL (ref 6–23)
BUN: 43 mg/dL — ABNORMAL HIGH (ref 6–23)
CO2: 28 mEq/L (ref 19–32)
CO2: 28 mEq/L (ref 19–32)
CO2: 29 mEq/L (ref 19–32)
Calcium: 9 mg/dL (ref 8.4–10.5)
Calcium: 9.3 mg/dL (ref 8.4–10.5)
Creatinine, Ser: 1 mg/dL (ref 0.4–1.5)
GFR calc Af Amer: >60 mL/min (ref >60)
GFR calc non Af Amer: >60 mL/min (ref >60)
GFR calc non Af Amer: >60 mL/min (ref >60)
GFR calc non Af Amer: >60 mL/min (ref >60)
Glucose, Bld: 170 mg/dL — ABNORMAL HIGH (ref 70–99)
Glucose, Bld: 177 mg/dL — ABNORMAL HIGH (ref 70–99)
Glucose, Bld: 186 mg/dL — ABNORMAL HIGH (ref 70–99)
Glucose, Bld: 187 mg/dL — ABNORMAL HIGH (ref 70–99)
Potassium: 3.2 mEq/L — ABNORMAL LOW (ref 3.5–5.1)
Potassium: 4.5 mEq/L (ref 3.5–5.1)
Sodium: 134 mEq/L — ABNORMAL LOW (ref 135–145)
Sodium: 135 mEq/L (ref 135–145)

## 2011-01-26 LAB — APTT: aPTT: 36 seconds (ref 24–37)

## 2011-01-26 LAB — D-DIMER, QUANTITATIVE: D-Dimer, Quant: 0.55 ug/mL-FEU — ABNORMAL HIGH (ref 0.00–0.48)

## 2011-01-26 LAB — POCT I-STAT, CHEM 8
Chloride: 98 mEq/L (ref 96–112)
HCT: 51 % (ref 39.0–52.0)
Hemoglobin: 17.3 g/dL — ABNORMAL HIGH (ref 13.0–17.0)
Potassium: 4 mEq/L (ref 3.5–5.1)

## 2011-01-26 LAB — HEMOGLOBIN A1C: Mean Plasma Glucose: 140 mg/dL

## 2011-02-04 ENCOUNTER — Encounter (HOSPITAL_BASED_OUTPATIENT_CLINIC_OR_DEPARTMENT_OTHER): Payer: Self-pay

## 2011-02-23 NOTE — Assessment & Plan Note (Signed)
[facility]                            CARDIOLOGY OFFICE NOTE   [name], [name]                        MRN:          [id number]  DATE:06/08/2007                            DOB:          1946/05/08    PRIMARY CARE PHYSICIAN:  [name], MD, [facility] in [place].   ENDOCRINOLOGIST:  Dr. Artist Pais in [place], [place].   [name] is 65 years old and returns for followup management of his  coronary heart disease.  His significant other, Augustine Radar, who usually  accompanies him, away this past June.   [name] has had bypass surgery in 2001.  He has good left ventricular  function.  He has had no chest pain or palpitations, but he does get  short of breath with minimal exertion.  He is quite heavy and attributes  this to deconditioning.   PAST MEDICAL HISTORY:  1. Noninsulin-dependent diabetes.  2. Hypertension.  3. Obstructive sleep apnea.  4. Hyperlipidemia.   CURRENT MEDICATIONS:  Prilosec, AeroBid inhalers, fluoxetine, Lipitor,  metoprolol, potassium, allopurinol, metolazone, Spironolactone, Zetia,  multivitamins, metformin, Lasix, Synthroid, and Omega-3.   EXAMINATION:  The blood pressure is 148/77, pulse 70 and regular.  There was no venous distention.  The carotid pulses were full without  bruit.  CHEST:  Clear without rales or rhonchi.  Carotid pulses were full.  There is a left carotid endarterectomy scar.  I could hear no bruits.  CARDIAC:  Rhythm was regular.  The heart sounds were normal.  I could  hear no murmurs or gallops.  ABDOMEN:  Protuberant.  There were normal bowel sounds, and the abdomen  was soft.  I could not feel any hepatosplenomegaly.  There was 2+ edema in the lower extremities.  The pedal pulses were  difficult to feel.   An electrocardiogram showed sinus rhythm with nonspecific ST-T changes.   IMPRESSION:  1. Coronary artery disease status post coronary artery bypass graft      surgery in  2001, now stable.  2. Good left ventricular function.  3. Excess weight.  4. Noninsulin-dependent diabetes.  5. Hypertension.  6. Hyperlipidemia.  7. Obstructive sleep apnea.  8. Hypothyroidism, treated.   RECOMMENDATIONS:  I think [name] is doing well from a cardiac  standpoint.  He has lost about 17 pounds and is working on that.  His  blood pressure is borderline, but it has been good on other occasions,  so we will not change his medications today, but I told him my target  would be 130/80.  He recently had lab work by [name], so I will not  repeat that today.  I will plan to see him back in followup in a year.     Bruce Elvera Lennox Juanda Chance, MD, [facility]  Electronically Signed    BRB/MedQ  DD: 06/08/2007  DT: 06/09/2007  Job #: [id number]

## 2011-02-23 NOTE — Assessment & Plan Note (Signed)
[facility]                            CARDIOLOGY OFFICE NOTE   [name], [name]                        MRN:          [id number]  DATE:06/04/2008                            DOB:          08/16/1946    PRIMARY CARE PHYSICIAN:  [name], [facility] in [place].   ENDOCRINOLOGIST:  [name] in [place].   CLINICAL HISTORY:  [name] is 65 years old and returned for followup  management of his coronary heart disease.  He had a bypass surgery in  2001 at [facility].  He has good LV function.  He done well from a  cardiac standpoint with no recent __________.  He has had occasional  left-sided chest pains, which were nonexertional.  He had occasional  palpitations.   PAST MEDICAL HISTORY:  Significant for non-insulin-dependent diabetes,  hypertension, and hyperlipidemia.   He has been intolerant to STATINS and is currently on Zetia.  He also  has excess weight, but has lost about over 40 pounds over the last  couple of years.   CURRENT MEDICATIONS:  1. Prilosec.  2. AeroBid inhaler.  3. Fluoxetine.  4. Lasix 40 mg 4 tablets daily.  5. Spironolactone 50 mg daily.  6. Metolazone 5 mg 2 tablets daily.  7. Zetia 10 mg daily.  8. Metoprolol 50 mg b.i.d.  9. Glipizide.  10.Potassium 20 mEq daily.  11.Allopurinol.  12.Aspirin.  13.Synthroid.  14.Omega 3.   PHYSICAL EXAMINATION:  VITAL SIGNS:  Blood pressure is 140/82 and pulse  77 and regular.  There is no venous distention.  NECK:  The carotid pulses were full without bruits.  CHEST:  Clear.  CARDIAC:  Rhythm was regular.  I see no murmurs or gallops.  ABDOMEN:  Soft with normal bowel sounds.  EXTREMITIES:  There was trace peripheral edema.  Pedal pulses were full.   Electrocardiogram showed sinus rhythm and minor nonspecific ST-T  changes.   IMPRESSION:  1. Coronary artery disease status post coronary artery bypass graft      during 2001, stable.  2.  Good left ventricular function.  3. Excess weight, but improvement with a loss of 48 pounds.  4. Non-insulin-dependent diabetes.  5. Hypertension.  6. Hyperlipidemia with intolerance to statins.  7. Obstructive sleep apnea.  8. Hypothyroidism, treated.  9. Low back pain with radicular pain and weakness in the left lower      extremity.  10.Status post left carotid endarterectomy, 2002.   RECOMMENDATIONS:  [name] appears to be stable from a cardiac  standpoint.  He had laboratory studies by [name] in [facility], and  we will obtain those.   He has a short bruit in the right carotid, and we will plan to repeat  his carotid Dopplers in the next couple of months.  He has not had any  evaluation of his low back pain except for plain x-rays, and he has both  radicular pain and weakness in the left lower extremity, which has  interfered with his ability to ambulate.  I will arrange for him to  be  seen in Neurology here at his request.  I will plan to see him back for  cardiac followup in a year, and we will do a Myoview scan prior to that  visit.  If he should require surgery for his back, we would want to do  the Myoview scan before clearing him from a cardiac standpoint for any  surgery that might be needed.     [name] Elvera Lennox Juanda Chance, MD, [facility]  Electronically Signed    BRB/MedQ  DD: 06/04/2008  DT: 06/05/2008  Job #: [id number]
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
## 2011-02-26 NOTE — Op Note (Signed)
[place]. [facility]  Patient:    [name], [name] Visit Number: [id number] MRN: [id number]          Service Type: Attending:  Di Kindle. Edilia Bo, M.D. Dictated by:   Di Kindle Edilia Bo, M.D. Proc. Date: 08/31/01   CC:         Everardo Beals. Juanda Chance, M.D. [facility]   Operative Report  PREOPERATIVE DIAGNOSIS:  Asymptomatic greater than 80% left carotid artery stenosis.  POSTOPERATIVE DIAGNOSIS:  Asymptomatic greater than 80% left carotid artery stenosis.  PROCEDURES: 1. Left carotid endarterectomy. 2. Resection of redundant left internal carotid artery. 3. Dacron patch angioplasty.  SURGEON:  Di Kindle. Edilia Bo, M.D.  ASSISTANTS:  Caralee Ates, M.D., and Sherrie George, P.A.  ANESTHESIA:  General.  INDICATION:  This is a 65 year old gentleman who underwent coronary revascularization in November 2001 by Dr. Laneta Simmers.  At that time he was found to have a moderate left carotid stenosis.  On a follow-up duplex scan on July 11, 2001, he had progression of the stenosis on the left, which was now greater than 80%.  Left carotid endarterectomy was recommended in order to lower his risk of future stroke.  The risks and potential complications were discussed with the patient in detail preoperatively.  DESCRIPTION OF PROCEDURE:  The patient was taken to the operating room and received a general anesthetic.  The arterial line had been placed by anesthesia.  This was not functioning, and therefore I replaced this right radial arterial line after the old catheter was removed.  This was done without difficulty.  The patient received a general anesthetic.  The left neck and upper chest were prepped and draped in the usual sterile fashion.  An incision was made along the anterior border of the sternocleidomastoid, and the dissection was carried down to the common carotid artery.  Of note, he had a very short, fat neck, and the dissection was quite deep.  The  common carotid artery was controlled proximally and had no significant plaque at this level. The facial vein was divided between 2-0 silk ties and then the internal carotid artery was dissected free.  Of note, the plaque extended very high and had to fully mobilize the hypoglossal nerve to allow exposure above the plaque.  Once this was mobilized, the artery appeared somewhat kinked.  The external carotid artery was controlled with a blue vessel loop.  Next, the internal, external, and common carotid arteries were clamped, and then a longitudinal arteriotomy was made in the common carotid artery.  Of note, the patient had been heparinized with 10,000 units of IV heparin.  Next, I attempted to place a 10 Bard shunt into the internal carotid artery; however, it would not go easily and I decided not to shunt the patient.  The patient had very brisk backbleeding from both the external and internal carotid arteries.  Therefore, the internal carotid artery was clamped again.  The vessel was irrigated with copious amounts of heparin and then the endarterectomy plane was established proximally.  Here the plaque was sharply divided.  Eversion endarterectomy was performed of the external carotid artery.  Distally there was a nice tapering of plaque and no tacking sutures were required.  However, the vessel was clearly very redundant, and it was decided that resection of the redundant internal carotid artery would be necessary to prevent kinking postoperatively.  Therefore, stay sutures were placed and then an approximately 2 cm segment of internal carotid artery was excised and then using two 6-0  Prolene sutures, the back wall was sewn back end-to-end primarily with these two 6-0 Prolene sutures.  Next, the Dacron patch was sewn with continuous 6-0 Prolene suture.  Prior to completing the patch closure, the vessels were backbled and flushed and then the anastomosis completed.  Flow was re-established  first to the external carotid artery and then to the internal carotid artery.  At the completion there was a good Doppler signal distal to the patch and a good pulse.  Hemostasis was obtained in the wound.  We partially reversed his heparin with protamine.  I did place a 15 Blake drain.  The deep layer was closed with a running 3-0 Vicryl.  The platysma was closed with running 3-0 Vicryl.  The skin was closed with a 3-0 subcuticular stitch.  A sterile dressing was applied.  The patient tolerated the procedure well, was transferred to the recovery room in satisfactory condition.  All needle and sponge counts were correct. Dictated by:   Di Kindle Edilia Bo, M.D. Attending:  Di Kindle. Edilia Bo, M.D. DD:  08/31/01 TD:  09/01/01 Job: [id number]

## 2011-02-26 NOTE — Cardiovascular Report (Signed)
[place]. [facility]  Patient:    [name], [name]                  MRN: [id number] Proc. Date: 08/18/00 Adm. Date:  [id number] Attending:  Lenoria Farrier CC:         Cardiopulmonary Laboratory   Cardiac Catheterization  PROCEDURES PERFORMED:  Right and left cardiac catheterization.  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  He has no prior history of known heart disease but he does have multiple risk factors including positive family history, hypertension, hyperlipidemia, and diabetes.  He also has sleep apnea.  He was admitted with chest pain with some typical and some atypical features of ischemia and was seen by [name] and was scheduled for catheterization.  DESCRIPTION OF PROCEDURE:  The procedure was performed via the right femoral artery using an arterial sheath and 6 French preformed coronary catheters.  A front wall arterial puncture was performed and Omnipaque contrast was used. Right heart catheterization was performed percutaneously via the right femoral vein using a Swan-Ganz thermodilution catheter.  A subclavian injection was performed to access the internal mammary artery for suitability for bypass grafting.  A distal aortogram was performed to rule out abdominal aortic aneurysm.  RESULTS:  The left main coronary artery:  The left main coronary artery was free of significant disease.  Left anterior descending:  The left anterior descending artery gave rise to a large septal perforator, a small diagonal branch, a large diagonal branch and two small septal perforators.  There was 70% stenosis in the proximal vessel after the septal perforator before the first small diagonal branch. The vessel then bifurcated into a large diagonal branch and a small caliber LAD.  The LAD had an 80% narrowing just after the diagonal branch which was segmental and the diagonal branch had an 80%  stenosis in its proximal portion. The vessel was diffusely diseased throughout this proximal to midportion.  The distal LAD was a small caliber vessel.  Circumflex artery:  The circumflex artery gave rise to an intermediate branch, an atrial branch, a large and a small posterolateral branch.  There was 70% and 80% stenosis in the proximal and midportion of the intermediate branch.  There was 70% proximal stenosis in the circumflex artery.  There was a 90% stenosis in the proximal portion of a large posterolateral branch with some segmental disease proximally estimated at 50% narrowing.  Right coronary artery:  The right coronary is a moderately large vessel that gave rise to a right ventricular branch, a posterior descending branch and a posterolateral branch.  There was 80% narrowing before the posterior descending branch.  LEFT VENTRICULOGRAPHY:  The left ventriculogram was performed in the RAO projection showed good wall motion with no areas of hypokinesis.  The estimated ejection fraction was 60%.  DISTAL AORTOGRAM:  Distal aortogram was performed which showed 50-70% stenosis in the right renal artery.  The left renal artery was patent and there was no significant aortoiliac obstruction.  HEMODYNAMIC DATA:  The right atrial pressure was 8 mean.  The right ventricular pressure was 40/9.  The pulmonary artery pressure was 40/20 with a mean of 29.  Pulmonary wedge pressure was 20 mean.  The aortic pressure was 125/72 and the left ventricular pressure was 125/23.  Cardiac output by thermodilution was 7.5/3.1 L/min. per sq m and by Fick was 6.2/2.6 L/min. sq m.  CONCLUSIONS:  Severe three-vessel coronary  artery disease with 70% stenosis in the proximal left anterior descending artery, 80% stenosis in the mid left anterior descending artery with 80% stenosis in a diagonal branch, 70% stenosis in the proximal circumflex artery with 90% stenosis in the large posterolateral branch and  80% stenosis in the distal right coronary artery with normal left ventricular function.  RECOMMENDATIONS:  The patient has significant three-vessel disease.  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  I will discuss this with him and will obtain a surgical consultation. DD:  08/18/00 TD:  08/18/00 Job: [id number]

## 2011-02-26 NOTE — Op Note (Signed)
[place]  Patient:    [name], [name]                  MRN: [id number] Proc. Date: 08/19/00 Adm. Date:  [id number] Attending:  Cleatrice Burke CC:         Everardo Beals. Juanda Chance, M.D. [facility]  Cardiac Catheterization Laboratory   Operative Report  PREOPERATIVE DIAGNOSIS:  Severe three-vessel coronary artery disease with unstable angina.  POSTOPERATIVE DIAGNOSIS:  Severe three-vessel coronary artery disease with unstable angina.  PROCEDURE:  Median sternotomy, extracorporeal circulation, coronary artery bypass graft surgery x 4 using a left internal mammary artery graft to the left anterior descending coronary artery, with a left radial artery graft to the obtuse marginal branch of the left circumflex coronary artery, and saphenous vein graft to the diagonal branch of the left anterior descending coronary artery, and the distal right coronary artery.  SURGEON:  Alleen Borne, M.D.  ASSISTANT:  Areta Haber, P.A.-C.  ANESTHESIA:  General endotracheal.  INDICATIONS:  This patient is a 65 year old white male with a history of obesity and previous smoking, as well as hyperlipidemia, and type 2 diabetes mellitus, who recently presented with recurrent episodes of chest pain, and was admitted with unstable angina.  His peak CPK was 79, with an MB fraction of 1.9.  He underwent a cardiac catheterization by Dr. Everardo Beals. Juanda Chance, and this showed severe three-vessel coronary artery disease.  The LAD was a small vessel and had 70% stenosis before the first diagonal branch, and then an 80% stenosis after the second diagonal branch.  The second diagonal branch was a large vessel with 80% stenosis.  The left circumflex had a small intermediate branch with a 70% and 80% stenosis.  The left circumflex itself had 70% proximal stenosis, and then 50%-90% stenosis of a large marginal branch.  The right coronary artery was a large dominant vessel with 80%  stenosis before the bifurcation.  The left ventricular ejection fraction was 60%.  After a review of the angiograms and examination of the patient,  it was felt that a coronary artery bypass graft surgery was the best treatment.  I felt that since the patient was relatively young, he would be best treated with a left mammary and a left radial artery graft.  A preoperative Doppler examination of his left arm showed normal palmar arch with a negative Allens test.  I discussed the operative procedure with the patient and with his wife and daughter, including alternatives, benefits, and risks, including bleeding, possible blood transfusion, infection, stroke, myocardial infarction, and death.  I also discussed the possibility of some numbness and paresthesias in the left arm, secondary to using the radial artery graft.  They understood and agreed to proceed.  DESCRIPTION OF PROCEDURE:  The patient was taken to the operating room and placed on the table in the supine position.  After the induction of general endotracheal anesthesia, the Foley catheter was placed into the bladder, using a sterile technique.  Then the chest, abdomen, and both lower extremities were prepped and draped in the usual sterile manner.  The chest was entered through a median sternotomy incision.  The pericardium was opened in the midline. Examination of the heart showed good ventricular contractility.  The ascending aorta had no palpable plaques in it.  The heart had a large amount of epicardial fat.  Then the left internal mammary artery was harvested from the chest wall as a pedicle graft.  This is  a large caliber vessel with excellent blood flow through it.  At the same time the left radial artery graft was harvested. This was a large vessel.  Prior to ligating the radial artery, it was occluded with an atraumatic vascular clamp, and good Doppler flows documented in the hand.  At the same time a segment of the  greater saphenous vein was harvested from the right lower leg, and this vein was of medium size and of good quality.  Then the patient was heparinized, and when an adequate activated clotting time was achieved, the distal ascending aorta was cannulated using a 24-French aortic cannula for arterial inflow.  The venous outflow was achieved using a large two-stage venous cannula through the right atrial appendage.  An antegrade cardioplegia and vent cannula were inserted in the aortic root.  The patient was placed on cardiopulmonary bypass and distal coronary arteries identified.  The LAD was a small to medium-sized vessel.  The diagonal branch was slightly larger than the LAD.  I had decided to place the left mammary graft to the LAD, since it was a smaller somewhat fragile artery, and I did not think that it would be suitable for grafting in the future, if a vein graft failed there.  The intermediate artery was small and deep under the epicardial fat, and I did not think this vessel was graftable.  The obtuse marginal was a large graftable vessel.  The right coronary artery was a large vessel.  It was heavily diseased in its midportion.  There was an area just at the bifurcation that was free of disease, and suitable for grafting.  Then the aorta was crossclamped and 500 cc of cold blood antegrade cardioplegia was administered in the aortic root, with a quick arrest of the heart.  Systemic hypothermia at 20 degrees centigrade and topical hypothermic iced saline was used.  A temperature probe was placed in the septum and an insulating pad in the pericardium.  The first distal anastomosis was performed to the obtuse marginal branch.  The internal diameter was about 3.0 mm.  The conduit used was a left radial artery graft, and this was anastomosed in an end-to-side manner using continuous #8-0 Prolene suture.  The pedicle was tacked to the epicardium with #6-0 Prolene sutures.  The  second distal anastomosis was performed to the distal right coronary  artery.  The internal diameter was about 3.0 mm.  The conduit used was a segment of greater saphenous vein, and the anastomosis performed in an end-to-side manner using continuous #7-0 Prolene suture.  Flow was measured through the graft and was excellent.  Then a dose of cardioplegia was given down the grafts, and in the aortic root.  The third distal anastomosis was performed to the diagonal branch.  The internal diameter was 1.75 mm.  The conduit used was a second segment of greater saphenous vein, and the anastomosis performed in an end-to-side manner using continuous #7-0 Prolene suture.  Flow was measured through the graft and was excellent.  The fourth distal anastomosis was performed to the midportion of the left anterior descending coronary artery.  The internal diameter was about 1.5 mm. The conduit used was the left internal mammary artery, and this was brought through an opening in the left pericardium, anterior to the phrenic nerve.  It was anastomosed to the LAD in an end-to-side manner using continuous #8-0 Prolene suture.  The pedicle was tacked to the epicardium with #6-0 Prolene sutures.  The patient was rewarmed  to 37 degrees centigrade, and the clamp was removed from the mammary artery pedicle.  There was rapid rewarming of the ventricular septum, and a return of spontaneous ventricular fibrillation.  The crossclamp was removed with a time of 68 minutes, and the patient was defibrillated into a sinus rhythm.  A partial occlusion clamp was placed in the aortic root and the three proximal anastomoses were performed in an end-to-side manner using continuous #6-0 Prolene suture.  The clamps were removed.  The vein grafts were deaired, and the clamps were removed from them.  The proximal and distal anastomoses appeared hemostatic, and the lie of the grafts satisfactory.  The graft markers were placed  around the proximal anastomoses.  Two temporary right ventricular and right atrial pacing wires were placed and brought out through the skin.  When the patient had rewarmed to 37 degrees centigrade, he was weaned from cardiopulmonary bypass, on no inotropic agents.  The total bypass time was 119 minutes.  The cardiac functioning appeared excellent, with a cardiac output of 5 L per minute.  Protamine was given and the venous and aortic cannulas were removed without difficulty.  Hemostasis was achieved.  Four chest tubes were placed, with bilateral pleural tubes of two in the posterior pericardium, and one in the anterior mediastinum.  The pericardium was reapproximated over the heart.  The sternum was closed with #6 stainless steel wires.  The fascia was then closed with a continuous #1 Vicryl suture.  The subcutaneous tissue was closed using continuous #2-0 Vicryl.  The skin was closed with #3-0 Vicryl subcuticular closure.  The lower extremity vein harvest site and the left radial artery harvest site were closed in layers in a similar manner.  The sponge, needle, and instrument counts were correct according to the scrub nurse.  Dry sterile dressings were applied over the incisions, around the chest tubes, which were hooked to Pleur-evac suction.  The patient remained hemodynamically stable and was transported to the surgical intensive care unit in guarded but stable condition. DD:  08/19/00 TD:  08/20/00 Job: [id number]
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
## 2011-02-26 NOTE — Discharge Summary (Signed)
[place]. [facility]  Patient:    [name], [name]                  MRN: [id number] Adm. Date:  [id number] Disc. Date: 08/25/00 Attending:  Cleatrice Burke Dictator:   Carlye Grippe. CCEverardo Beals. Juanda Chance, M.D. [facility]  Charlaine Dalton. Sherene Sires, M.D. [facility]  Adrian Saran., M.D., [place]   Discharge Summary  HISTORY OF PRESENT ILLNESS:  This is a 65 year old white male with no known history of coronary artery disease who had onset of left chest pain at approximately 1 p.m. on the day of admission.  The pain radiated across the chest and through to the back and was cramping in nature, approximately 2 to 3/10, with no shortness of breath, no nausea or vomiting, no diaphoresis.  The pain lasted approximately 30 to 45 minutes.  He took an aspirin only.  The patient had never had a similar episode up until approximately two weeks ago but did have approximately 5 to 6 prior to the date of admission with the date of admissions episode the most severe.  The patient has had no exertional pain; however, he almost never exerts himself.  He denies fatigue, dyspnea on exertion, palpitations, change with position, cough or deep inspiration.  He denies fevers, chills, cough, or other constitutional symptoms.  He was felt to require admission for further evaluation including rule out myocardial infarction as well as possible further diagnostic workup.  PAST MEDICAL HISTORY:  History of respiratory failure secondary to hypoventilation and obesity.  He has a history of obstructive sleep apnea with previous CPAP use, none on admission.  History of COPD.  Cardiac risk factors included non-insulin-dependent diabetes mellitus, hypertension, hyperlipidemia, history of alcohol use, history of obesity, history of congestive heart failure.  PAST SURGICAL HISTORY:  Left leg surgery.  There is no more specific notation in regard to this.  FAMILY  HISTORY, SOCIAL HISTORY, REVIEW OF SYSTEMS, AND PHYSICAL EXAMINATION: Please see History & Physical done on admission.  ALLERGIES:  No known drug allergies.  HOSPITAL COURSE:  The patient was admitted.  EKG showed no ischemic changes. Chest x-ray showed cardiomegaly with no infiltrates but poor inspiratory effort.  CK-MB enzymes were nondiagnostic.  The patient was subsequently started on Lovenox, beta blocker, and nitroglycerin with plans for Aggrastat if the pain recurred.  An echocardiogram as well as cardiac catheterization was scheduled, and these were performed.  On August 18, 2000, the patient underwent cardiac catheterization where he was found to have severe multivessel coronary artery disease including 70% proximal LAD, 80% mid LAD, 80% diagonal, 70% proximal circumflex, 90% posterolateral, 80% distal right coronary artery.  Left ventricular function was normal.  Ejection fraction was 60%.  There was a 50 to 70% right renal artery stenosis noted.  The patient was not felt to be a candidate for percutaneous intervention, and surgical consultation was obtained with Alleen Borne, M.D., who evaluated the patient and his studies and agreed with recommendations for proceeding with coronary revascularization.  Procedure: On August 19, 2000, the patient underwent coronary artery bypass graft x 4.  The following grafts were placed: 1) Left anterior mammary artery to the LAD, 2) left radial artery to the obtuse marginal, 3) saphenous vein graft to the distal right coronary artery, 4) saphenous vein graft to the diagonal.  Intraoperative findings included a very large, fatty heart.  The intermediate  coronary artery was noted to be small, fragile, and deep in fat and, therefore, not graftable.  The patient was taken in stable condition to the surgical intensive care unit.  Postoperative hospital course: The patient was extubated without difficulty. He was initially drowsy but  neurologically intact.  He was stable in regard to oxygen saturations on 4 liters nasal cannula.  Hemodynamically, he remained stable throughout.  He has required a fairly aggressive diuresis but has responded well to this.  Routine line, monitors, and tubes have all been discontinued.  His pulmonary status has remained stable throughout.  His oxygen has been weaned, and he maintains good saturations on room air. laboratory values have remained stable.  His most recent hemoglobin and hematocrit dated August 21, 2000, were 27.3 and 9.7, respectively.  His incisions are all healing well without signs of infection.  His left radial artery harvest site is neurovascularly intact.  His capillary blood glucoses have been monitored aggressively, and he maintains good control on his current regimen.  It is noted the patient had a slightly elevated TSH preoperatively of 5.83, and this may need further addressing as an outpatient.  Currently, the patient is quite stable.  He is tolerating routine cardiac rehabilitation phase 1 modalities fairly well.  The diabetes team has evaluated the patient, and arrangements have been made for outpatient education.  His overall status is felt to be quite stable for tentative discharge on the morning of August 25, 2000, pending morning round reevaluation.  DISCHARGE MEDICATIONS:  1. Avandia 8 mg q.d.  2. Ecotrin 325 mg q.d.  3. Prozac 20 mg daily.  4. Imdur 30 mg q.d. x 1 month for radial artery antispasm agent.  5. Glucotrol 5 mg b.i.d.  6. Lasix 40 mg b.i.d. daily x 1 week and then once daily.  7. Potassium chloride 20 mEq b.i.d. x 1 week, then daily.  8. Multivitamin 1 q.d.  9. Lopressor 25 mg b.i.d. 10. Protonix 40 mg q.d. 11. Lipitor 20 mg q.d. 12. Allopurinol 300 mg daily. 13. Tylox 1 to 2 q.4-6h. p.r.n. as needed.  FOLLOWUP:  Dr. Laneta Simmers in three weeks, Dr. Delia Chimes P.A. in two weeks.  FINAL DIAGNOSES:   1. Severe multivessel coronary artery  disease as described above.  2. Diabetes mellitus.  3. Hypertension.  4. History of sleep apnea.  5. History of chronic obstructive pulmonary disease.  6. History of hyperlipidemia.  7. History of alcohol use.  8. History of respiratory failure secondary to obesity hypoventilation.  9. History of obesity. 10. History of congestive heart failure. 11. Mild postoperative anemia. 12. ? Hypothyroidism with elevated thyroid-stimulating hormone.  DISCHARGE INSTRUCTIONS:  The patient will receive written instructions in regard to medications, activity, diet, wound care, and followup. DD:  08/24/00 TD:  08/24/00 Job: [id number]

## 2011-02-26 NOTE — Discharge Summary (Signed)
[place]. [facility]  Patient:    [name], [name] Visit Number: [id number] MRN: [id number]          Service Type: SUR Location: 3300 3304 [id number] Attending Physician:  Bennye Alm Dictated by:   Maxwell Marion, RNFA Admit Date:  08/31/2001 Discharge Date: 09/01/2001   CC:         Di Kindle. Edilia Bo, M.D. (CVTS OFFICE)  Everardo Beals. Juanda Chance, M.D. [facility]   Discharge Summary  This is a brief discharge summary on [name].  ADMITTING DIAGNOSIS:  Left internal coronary artery stenosis.  PAST MEDICAL HISTORY: 1.  Coronary artery disease status post coronary artery bypass grafting     November 2002. 2.  COPD with history of tobacco use. 3.  Obesity. 4.  Gastroesophageal reflux disease. 5.  History of sleep apnea. 6.  Diabetes mellitus Type II. 7.  Hypertension. 8.  Hypercholesterolemia. 9.  History of alcohol abuse. 10. Depression.  ALLERGIES:  OROMYCIN.  DISCHARGE DIAGNOSIS:  Left internal coronary artery stenosis status post left left carotidendarterectomy.  BRIEF HISTORY:  [name] is a 65 year old Caucasian man who underwent coronary revascularization last year by Dr. Milinda Antis.  At the time of that surgery he was noted to have moderate left carotid stenosis.  He underwent follow up duplex scan in October 2002.  This showed evidence of progression of stenosis of his left carotid artery now 80%.  He was evaluated by Dr. Waverly Ferrari in his office on August 09, 2001.  Dr. Edilia Bo recommended carotidendarterectomy to lower his risk of future stroke.  PROCEDURE:  Risks and benefits discussed with the patient.  [name] agreed to proceed.  HOSPITAL COURSE AND PROCEDURE:  On August 31, 2001, [name] was electively admitted to [facility] under the care of Dr. Edilia Bo and underwent an uncomplicated left carotidendarterectomy with Dacron patch angioplasty and resection of redundant internal carotid artery with primary  anastomosis. At the conclusion of the procedure, he was transferred in stable condition to the SICU.  He was neurologically intact.  His postoperative course has been uneventful.  He is progressing well and recovering from his surgery. Anticipate he will be ready for discharge home this afternoon on September 01, 2001.  CONDITION ON DISCHARGE: Improved.  INSTRUCTIONS ON DISCHARGE:  For medications, activity, diet, wound care and follow up appointments please see discharge instruction sheet for details.  HOME DISCHARGE MEDICATIONS:  Tylox one to two p.o. q.4.h. p.r.n. pain.  He has been instructed to resume his home medications of metoprolol 25 mg p.o. b.i.d., glipizide 5 mg p.o. q.d., Lipitor 20 mg q.h.s., enteric-coated aspirin, spironolactone 50 mg p.o. q.d., Zaroxolyn 5 mg p.o. q.d., Avandia 8 mg p.o. q.d., allopurinol q.d., furosemide 40 mg p.o. q.d., Protonix 40 mg p.o. q.d. and fluoxetine q.d.   FOLLOW UP:  [name] has an appointment to see Dr. Edilia Bo at the CVTS office on Wednesday, December 4 at 12:00 pm. Dictated by:   Maxwell Marion, RNFA Attending Physician:  Bennye Alm DD:  09/01/01 TD:  09/02/01 Job: [id number]

## 2011-03-04 ENCOUNTER — Ambulatory Visit (INDEPENDENT_AMBULATORY_CARE_PROVIDER_SITE_OTHER): Payer: Medicare Other | Admitting: Cardiovascular Disease

## 2011-03-04 ENCOUNTER — Encounter: Payer: Self-pay | Admitting: Cardiovascular Disease

## 2011-03-04 VITALS — BP 143/76 | HR 82 | Resp 16 | Ht 69.0 in | Wt 280.1 lb

## 2011-03-04 DIAGNOSIS — I2510 Atherosclerotic heart disease of native coronary artery without angina pectoris: Secondary | ICD-10-CM

## 2011-03-04 NOTE — Assessment & Plan Note (Signed)
Stable. No changes. He will resume his meds.

## 2011-03-04 NOTE — Patient Instructions (Signed)
Your physician recommends that you schedule a follow-up appointment in: 6 months with Dr. McAlhany.  

## 2011-03-04 NOTE — Progress Notes (Signed)
History of Present Illness:65 yo WM with history of CAD s/p 4V CABG 2001, atrial fibrillation, COPD, venous insufficiency and diastolic CHF here for cardiac follow up. He has been followed by Dr. Juanda Chance.  He had bypass surgery in 2001. He had a negative Myoview scan in 2010. In 2010 he developed paroxysmal fibrillation and in persistent atrial fibrillation. He was not felt to be a Coumadin candidate because of alcohol consumption. He did have a [place] score 3. He was admitted last year with cellulitis and has had issues with CHF in the past.   He is here today to meet me. He recently fell and hurt his left leg. NO chest pain or SOB. No palpitations, near syncope or syncope. He has stopped drinking etoh.   His primary care doctor is [name] in [place]. He did not take his meds yesterday or today.     Past Medical History  Diagnosis Date  . CHRONIC OBSTRUCTIVE PULMONARY DISEASE 06/20/2009  . OBSTRUCTIVE SLEEP APNEA 06/20/2009  . CAROTID STENOSIS 06/20/2009  . DM 06/20/2009  . CAD 06/20/2009  . HYPERLIPIDEMIA 06/20/2009  . HYPERTENSION 06/20/2009  . Overweight   . Hypothyroidism   . Low back pain     Past Surgical History  Procedure Date  . Carotid endarterectomy 2002  . Coronary artery bypass graft     Current Outpatient Prescriptions  Medication Sig Dispense Refill  . albuterol (ACCUNEB) 0.63 MG/3ML nebulizer solution Take 1 ampule by nebulization daily as needed.        Marland Kitchen allopurinol (ZYLOPRIM) 300 MG tablet Take 300 mg by mouth daily.        Marland Kitchen aspirin 325 MG tablet Take 325 mg by mouth daily.        Marland Kitchen FLUoxetine (PROZAC) 20 MG capsule Take 20 mg by mouth daily.        . furosemide (LASIX) 80 MG tablet Take 160 mg by mouth daily.        Marland Kitchen glipiZIDE (GLUCOTROL) 10 MG tablet Take 10 mg by mouth 2 (two) times daily before a meal.        . levothyroxine (SYNTHROID, LEVOTHROID) 150 MCG tablet Take 150 mcg by mouth daily.        . magnesium oxide (MAG-OX) 400 MG tablet Take 400 mg by  mouth daily.        . metoprolol (LOPRESSOR) 50 MG tablet Take 50 mg by mouth 2 (two) times daily.        Marland Kitchen omeprazole (PRILOSEC) 20 MG capsule Take 20 mg by mouth daily.        . potassium chloride SA (K-DUR,KLOR-CON) 20 MEQ tablet Take 20 mEq by mouth daily.        . rosuvastatin (CRESTOR) 20 MG tablet Take 20 mg by mouth daily.        Marland Kitchen spironolactone (ALDACTONE) 50 MG tablet Take 50 mg by mouth daily.        Marland Kitchen DISCONTD: gabapentin (NEURONTIN) 100 MG capsule Take 100 mg by mouth 3 (three) times daily.        Marland Kitchen DISCONTD: metolazone (ZAROXOLYN) 5 MG tablet Take 5 mg by mouth daily as needed.          No Known Allergies  History   Social History  . Marital Status: Widowed    Spouse Name: N/A    Number of Children: N/A  . Years of Education: N/A   Occupational History  . Not on file.   Social History Main Topics  .  Smoking status: Former Smoker    Quit date: 10/11/1968  . Smokeless tobacco: Not on file  . Alcohol Use: Not on file  . Drug Use: Not on file  . Sexually Active: Not on file   Other Topics Concern  . Not on file   Social History Narrative  . No narrative on file    No family history on file.  Review of Systems:  As stated in the HPI and otherwise negative.   BP 143/76  Pulse 82  Resp 16  Ht 5\' 9"  (1.753 m)  Wt 280 lb 1.9 oz (127.062 kg)  BMI 41.37 kg/m2  Physical Examination: General: Well developed, well nourished, NAD HEENT: OP clear, mucus membranes moist SKIN: warm, dry. No rashes. Neuro: No focal deficits Musculoskeletal: Muscle strength 5/5 all ext Psychiatric: Mood and affect normal Neck: No JVD, no carotid bruits, no thyromegaly, no lymphadenopathy. Lungs:Clear bilaterally, no wheezes, rhonci, crackles Cardiovascular: Regular rate and rhythm. No murmurs, gallops or rubs. Abdomen:Soft. Bowel sounds present. Non-tender.  Extremities: Bilateral 1+ edema, chronic venous stasis changes.

## 2011-03-08 ENCOUNTER — Emergency Department (HOSPITAL_COMMUNITY): Payer: Medicare Other

## 2011-03-08 ENCOUNTER — Emergency Department (HOSPITAL_COMMUNITY)
Admission: EM | Admit: 2011-03-08 | Discharge: 2011-03-08 | Disposition: A | Discharge status: Home / Self Care | Payer: Medicare Other | Attending: Emergency Medicine | Admitting: Emergency Medicine

## 2011-03-08 DIAGNOSIS — I4891 Unspecified atrial fibrillation: Secondary | ICD-10-CM | POA: Insufficient documentation

## 2011-03-08 DIAGNOSIS — L989 Disorder of the skin and subcutaneous tissue, unspecified: Secondary | ICD-10-CM | POA: Insufficient documentation

## 2011-03-08 DIAGNOSIS — M545 Low back pain, unspecified: Secondary | ICD-10-CM | POA: Insufficient documentation

## 2011-03-08 DIAGNOSIS — I10 Essential (primary) hypertension: Secondary | ICD-10-CM | POA: Insufficient documentation

## 2011-03-08 DIAGNOSIS — Z862 Personal history of diseases of the blood and blood-forming organs and certain disorders involving the immune mechanism: Secondary | ICD-10-CM | POA: Insufficient documentation

## 2011-03-08 DIAGNOSIS — Z79899 Other long term (current) drug therapy: Secondary | ICD-10-CM | POA: Insufficient documentation

## 2011-03-08 DIAGNOSIS — E119 Type 2 diabetes mellitus without complications: Secondary | ICD-10-CM | POA: Insufficient documentation

## 2011-03-08 DIAGNOSIS — G8929 Other chronic pain: Secondary | ICD-10-CM | POA: Insufficient documentation

## 2011-03-08 DIAGNOSIS — R0602 Shortness of breath: Secondary | ICD-10-CM | POA: Insufficient documentation

## 2011-03-08 DIAGNOSIS — I2510 Atherosclerotic heart disease of native coronary artery without angina pectoris: Secondary | ICD-10-CM | POA: Insufficient documentation

## 2011-03-08 DIAGNOSIS — Z8639 Personal history of other endocrine, nutritional and metabolic disease: Secondary | ICD-10-CM | POA: Insufficient documentation

## 2011-03-08 DIAGNOSIS — E039 Hypothyroidism, unspecified: Secondary | ICD-10-CM | POA: Insufficient documentation

## 2011-03-12 ENCOUNTER — Telehealth: Payer: Self-pay | Admitting: Cardiovascular Disease

## 2011-03-12 NOTE — Telephone Encounter (Signed)
Echo faxed to [facility] @ [phone]  03/12/11/km

## 2011-09-13 ENCOUNTER — Other Ambulatory Visit: Payer: Self-pay | Admitting: Cardiovascular Disease

## 2011-09-13 DIAGNOSIS — I6529 Occlusion and stenosis of unspecified carotid artery: Secondary | ICD-10-CM

## 2011-09-14 ENCOUNTER — Encounter: Payer: Self-pay | Admitting: Cardiovascular Disease

## 2011-09-14 ENCOUNTER — Encounter (INDEPENDENT_AMBULATORY_CARE_PROVIDER_SITE_OTHER): Payer: Medicare Other | Admitting: *Deleted

## 2011-09-14 ENCOUNTER — Ambulatory Visit (INDEPENDENT_AMBULATORY_CARE_PROVIDER_SITE_OTHER): Payer: Medicare Other | Admitting: Cardiovascular Disease

## 2011-09-14 VITALS — BP 124/63 | HR 76 | Ht 69.0 in | Wt 285.0 lb

## 2011-09-14 DIAGNOSIS — I10 Essential (primary) hypertension: Secondary | ICD-10-CM

## 2011-09-14 DIAGNOSIS — R0989 Other specified symptoms and signs involving the circulatory and respiratory systems: Secondary | ICD-10-CM

## 2011-09-14 DIAGNOSIS — G4733 Obstructive sleep apnea (adult) (pediatric): Secondary | ICD-10-CM

## 2011-09-14 DIAGNOSIS — I4891 Unspecified atrial fibrillation: Secondary | ICD-10-CM

## 2011-09-14 DIAGNOSIS — I6529 Occlusion and stenosis of unspecified carotid artery: Secondary | ICD-10-CM

## 2011-09-14 DIAGNOSIS — R0600 Dyspnea, unspecified: Secondary | ICD-10-CM

## 2011-09-14 DIAGNOSIS — I2510 Atherosclerotic heart disease of native coronary artery without angina pectoris: Secondary | ICD-10-CM

## 2011-09-14 NOTE — Assessment & Plan Note (Signed)
Stable moderate bilateral disease. F/U one year.

## 2011-09-14 NOTE — Assessment & Plan Note (Signed)
He is supposed to be using CPAP but has been non-compliant. I have stressed the importance of compliance.

## 2011-09-14 NOTE — Progress Notes (Signed)
History of Present Illness: 65 yo WM with history of CAD s/p 4V CABG 2001, atrial fibrillation, COPD, venous insufficiency and diastolic CHF here for cardiac follow up. He has been followed by Dr. Juanda Chance. I saw him for the first time in May 2012. He had bypass surgery in 2001. He had a negative Myoview scan in 2010. In 2010 he developed paroxysmal fibrillation and has been in persistent atrial fibrillation. He was not felt to be a Coumadin candidate because of alcohol consumption. He did have a [place] score 3. He was admitted in 2011 with cellulitis and has had issues with CHF in the past.   He is here today for f/u. No chest pain. He does note SOB over the last few days. He is known to have COPD,OSA and obesity.  He is not aware of any irregularities of his heart rhythm. No palpitations, near syncope or syncope. He has stopped drinking etoh. Carotid artery dopplers today with stable bilateral disease (40-59% bilateral stenosis, left CEA patch angioplasty is patent).  His primary care doctor is [name] in [facility].  His lipids are followed by [name]. He has OSA but has not been wearing CPAP. He has his OSA followed in [place].    Past Medical History  Diagnosis Date  . CHRONIC OBSTRUCTIVE PULMONARY DISEASE 06/20/2009  . OBSTRUCTIVE SLEEP APNEA 06/20/2009  . CAROTID STENOSIS 06/20/2009  . DM 06/20/2009  . CAD 06/20/2009  . HYPERLIPIDEMIA 06/20/2009  . HYPERTENSION 06/20/2009  . Overweight   . Hypothyroidism   . Low back pain     Past Surgical History  Procedure Date  . Carotid endarterectomy 2002  . Coronary artery bypass graft     Current Outpatient Prescriptions  Medication Sig Dispense Refill  . albuterol (ACCUNEB) 0.63 MG/3ML nebulizer solution Take 1 ampule by nebulization daily as needed.        Marland Kitchen allopurinol (ZYLOPRIM) 300 MG tablet Take 300 mg by mouth daily.        Marland Kitchen aspirin 325 MG tablet Take 325 mg by mouth daily.        Marland Kitchen FLUoxetine (PROZAC) 20 MG capsule Take 20  mg by mouth daily.        . furosemide (LASIX) 80 MG tablet Take 160 mg by mouth daily.        Marland Kitchen glipiZIDE (GLUCOTROL) 10 MG tablet Take 10 mg by mouth 2 (two) times daily before a meal.        . levothyroxine (SYNTHROID, LEVOTHROID) 150 MCG tablet Take 150 mcg by mouth daily.        . magnesium oxide (MAG-OX) 400 MG tablet Take 400 mg by mouth daily.        . metoprolol (LOPRESSOR) 50 MG tablet Take 50 mg by mouth 2 (two) times daily.        Marland Kitchen omeprazole (PRILOSEC) 20 MG capsule Take 20 mg by mouth daily.        . potassium chloride SA (K-DUR,KLOR-CON) 20 MEQ tablet Take 20 mEq by mouth daily.        . rosuvastatin (CRESTOR) 20 MG tablet Take 20 mg by mouth daily.        Marland Kitchen spironolactone (ALDACTONE) 50 MG tablet Take 50 mg by mouth daily.          No Known Allergies  History   Social History  . Marital Status: Widowed    Spouse Name: N/A    Number of Children: N/A  . Years of Education:  N/A   Occupational History  . Not on file.   Social History Main Topics  . Smoking status: Former Smoker    Quit date: 10/11/1968  . Smokeless tobacco: Not on file  . Alcohol Use: Not on file  . Drug Use: Not on file  . Sexually Active: Not on file   Other Topics Concern  . Not on file   Social History Narrative  . No narrative on file    No family history on file.  Review of Systems:  As stated in the HPI and otherwise negative.   BP 124/63  Pulse 76  Ht 5\' 9"  (1.753 m)  Wt 285 lb (129.275 kg)  BMI 42.09 kg/m2  Physical Examination: General: Well developed, well nourished, NAD HEENT: OP clear, mucus membranes moist SKIN: warm, dry. No rashes. Neuro: No focal deficits Musculoskeletal: Muscle strength 5/5 all ext Psychiatric: Mood and affect normal Neck: No JVD, bilateral carotid bruits, no thyromegaly, no lymphadenopathy. Lungs:Clear bilaterally, no wheezes, rhonci, crackles Cardiovascular: Irregular.  No murmurs, gallops or rubs. Abdomen:Soft. Bowel sounds present.  Non-tender.  Extremities: 1-2+ bilateral  lower extremity edema with chronic venous stasis changes. Pulses are hard to palpate given the extent of his edema.   Carotid artery dopplers: 12/0/412:  40-59% bilateral stenosis, left CEA patch angioplasty is patent

## 2011-09-14 NOTE — Assessment & Plan Note (Signed)
Stable No changes 

## 2011-09-14 NOTE — Assessment & Plan Note (Signed)
Likely multifactorial given his obesity, sedentary lifestyle, OSA, COPD, diastolic heart failure and CAD. Will get echo to assess LV size and function and exclude valvular heart disease.

## 2011-09-14 NOTE — Assessment & Plan Note (Signed)
Good rate control. He is not a coumadin candidate because of his alcohol use and frequent falls. I had a long discussion today with the pt and his daughter in regards to CVA risk. He does not wish to stop using alcohol. We will continue with ASA alone.

## 2011-09-14 NOTE — Assessment & Plan Note (Signed)
BP well controlled.

## 2011-09-14 NOTE — Patient Instructions (Signed)

## 2011-09-28 ENCOUNTER — Ambulatory Visit (HOSPITAL_COMMUNITY): Payer: Medicare Other | Attending: Cardiovascular Disease | Admitting: Radiology

## 2011-09-28 DIAGNOSIS — I2510 Atherosclerotic heart disease of native coronary artery without angina pectoris: Secondary | ICD-10-CM

## 2011-09-28 DIAGNOSIS — E119 Type 2 diabetes mellitus without complications: Secondary | ICD-10-CM | POA: Insufficient documentation

## 2011-09-28 DIAGNOSIS — R0989 Other specified symptoms and signs involving the circulatory and respiratory systems: Secondary | ICD-10-CM | POA: Insufficient documentation

## 2011-09-28 DIAGNOSIS — R0609 Other forms of dyspnea: Secondary | ICD-10-CM | POA: Insufficient documentation

## 2011-09-28 DIAGNOSIS — I10 Essential (primary) hypertension: Secondary | ICD-10-CM | POA: Insufficient documentation

## 2011-09-28 DIAGNOSIS — I059 Rheumatic mitral valve disease, unspecified: Secondary | ICD-10-CM | POA: Insufficient documentation

## 2011-09-28 DIAGNOSIS — E785 Hyperlipidemia, unspecified: Secondary | ICD-10-CM | POA: Insufficient documentation

## 2011-09-28 DIAGNOSIS — I379 Nonrheumatic pulmonary valve disorder, unspecified: Secondary | ICD-10-CM | POA: Insufficient documentation

## 2011-09-28 DIAGNOSIS — R0600 Dyspnea, unspecified: Secondary | ICD-10-CM

## 2011-09-28 MED ORDER — PERFLUTREN PROTEIN A MICROSPH IV SUSP
1.5000 mL | Freq: Once | INTRAVENOUS | Status: AC
  Administered 2011-09-28: 1.5 mL via INTRAVENOUS

## 2011-09-28 MED ORDER — PERFLUTREN PROTEIN A MICROSPH IV SUSP: 1.5000 mL | Freq: Once | INTRAVENOUS | Status: DC

## 2011-10-05 ENCOUNTER — Inpatient Hospital Stay (HOSPITAL_COMMUNITY)
Admission: AD | Admit: 2011-10-05 | Discharge: 2011-10-16 | DRG: 177 | Disposition: A | Discharge status: Skilled Nursing Facility | Payer: Medicare Other | Source: Other Acute Inpatient Hospital | Attending: Family Medicine | Admitting: Family Medicine

## 2011-10-05 DIAGNOSIS — R609 Edema, unspecified: Secondary | ICD-10-CM

## 2011-10-05 DIAGNOSIS — D649 Anemia, unspecified: Secondary | ICD-10-CM | POA: Diagnosis not present

## 2011-10-05 DIAGNOSIS — I872 Venous insufficiency (chronic) (peripheral): Secondary | ICD-10-CM | POA: Diagnosis present

## 2011-10-05 DIAGNOSIS — I119 Hypertensive heart disease without heart failure: Secondary | ICD-10-CM | POA: Insufficient documentation

## 2011-10-05 DIAGNOSIS — R0600 Dyspnea, unspecified: Secondary | ICD-10-CM

## 2011-10-05 DIAGNOSIS — E119 Type 2 diabetes mellitus without complications: Secondary | ICD-10-CM | POA: Diagnosis present

## 2011-10-05 DIAGNOSIS — F1020 Alcohol dependence, uncomplicated: Secondary | ICD-10-CM | POA: Diagnosis present

## 2011-10-05 DIAGNOSIS — I6529 Occlusion and stenosis of unspecified carotid artery: Secondary | ICD-10-CM

## 2011-10-05 DIAGNOSIS — F1027 Alcohol dependence with alcohol-induced persisting dementia: Secondary | ICD-10-CM | POA: Diagnosis present

## 2011-10-05 DIAGNOSIS — E875 Hyperkalemia: Secondary | ICD-10-CM | POA: Diagnosis present

## 2011-10-05 DIAGNOSIS — J9620 Acute and chronic respiratory failure, unspecified whether with hypoxia or hypercapnia: Secondary | ICD-10-CM | POA: Diagnosis present

## 2011-10-05 DIAGNOSIS — E1165 Type 2 diabetes mellitus with hyperglycemia: Secondary | ICD-10-CM | POA: Insufficient documentation

## 2011-10-05 DIAGNOSIS — I5032 Chronic diastolic (congestive) heart failure: Secondary | ICD-10-CM

## 2011-10-05 DIAGNOSIS — J441 Chronic obstructive pulmonary disease with (acute) exacerbation: Secondary | ICD-10-CM | POA: Diagnosis present

## 2011-10-05 DIAGNOSIS — Z7982 Long term (current) use of aspirin: Secondary | ICD-10-CM

## 2011-10-05 DIAGNOSIS — K5900 Constipation, unspecified: Secondary | ICD-10-CM | POA: Diagnosis present

## 2011-10-05 DIAGNOSIS — G4733 Obstructive sleep apnea (adult) (pediatric): Secondary | ICD-10-CM | POA: Diagnosis present

## 2011-10-05 DIAGNOSIS — J449 Chronic obstructive pulmonary disease, unspecified: Secondary | ICD-10-CM | POA: Insufficient documentation

## 2011-10-05 DIAGNOSIS — E039 Hypothyroidism, unspecified: Secondary | ICD-10-CM | POA: Diagnosis present

## 2011-10-05 DIAGNOSIS — J9600 Acute respiratory failure, unspecified whether with hypoxia or hypercapnia: Secondary | ICD-10-CM

## 2011-10-05 DIAGNOSIS — Z79899 Other long term (current) drug therapy: Secondary | ICD-10-CM

## 2011-10-05 DIAGNOSIS — I10 Essential (primary) hypertension: Secondary | ICD-10-CM | POA: Diagnosis present

## 2011-10-05 DIAGNOSIS — I480 Paroxysmal atrial fibrillation: Secondary | ICD-10-CM | POA: Insufficient documentation

## 2011-10-05 DIAGNOSIS — I214 Non-ST elevation (NSTEMI) myocardial infarction: Secondary | ICD-10-CM | POA: Diagnosis present

## 2011-10-05 DIAGNOSIS — J158 Pneumonia due to other specified bacteria: Secondary | ICD-10-CM

## 2011-10-05 DIAGNOSIS — J189 Pneumonia, unspecified organism: Secondary | ICD-10-CM | POA: Diagnosis present

## 2011-10-05 DIAGNOSIS — Z951 Presence of aortocoronary bypass graft: Secondary | ICD-10-CM

## 2011-10-05 DIAGNOSIS — F1010 Alcohol abuse, uncomplicated: Secondary | ICD-10-CM | POA: Insufficient documentation

## 2011-10-05 DIAGNOSIS — J4489 Other specified chronic obstructive pulmonary disease: Secondary | ICD-10-CM | POA: Insufficient documentation

## 2011-10-05 DIAGNOSIS — Z87891 Personal history of nicotine dependence: Secondary | ICD-10-CM

## 2011-10-05 DIAGNOSIS — I4891 Unspecified atrial fibrillation: Secondary | ICD-10-CM | POA: Diagnosis present

## 2011-10-05 DIAGNOSIS — I5023 Acute on chronic systolic (congestive) heart failure: Secondary | ICD-10-CM | POA: Diagnosis present

## 2011-10-05 DIAGNOSIS — J690 Pneumonitis due to inhalation of food and vomit: Principal | ICD-10-CM | POA: Diagnosis present

## 2011-10-05 DIAGNOSIS — I509 Heart failure, unspecified: Secondary | ICD-10-CM | POA: Diagnosis present

## 2011-10-05 DIAGNOSIS — I2510 Atherosclerotic heart disease of native coronary artery without angina pectoris: Secondary | ICD-10-CM | POA: Diagnosis present

## 2011-10-05 DIAGNOSIS — N289 Disorder of kidney and ureter, unspecified: Secondary | ICD-10-CM | POA: Diagnosis present

## 2011-10-05 DIAGNOSIS — Z23 Encounter for immunization: Secondary | ICD-10-CM

## 2011-10-05 DIAGNOSIS — E785 Hyperlipidemia, unspecified: Secondary | ICD-10-CM | POA: Diagnosis present

## 2011-10-05 DIAGNOSIS — R5381 Other malaise: Secondary | ICD-10-CM | POA: Diagnosis not present

## 2011-10-05 DIAGNOSIS — E669 Obesity, unspecified: Secondary | ICD-10-CM | POA: Diagnosis present

## 2011-10-05 DIAGNOSIS — I501 Left ventricular failure: Secondary | ICD-10-CM

## 2011-10-05 DIAGNOSIS — G9341 Metabolic encephalopathy: Secondary | ICD-10-CM | POA: Diagnosis present

## 2011-10-05 HISTORY — DX: Pneumonia, unspecified organism: J18.9

## 2011-10-05 HISTORY — DX: Personal history of nicotine dependence: Z87.891

## 2011-10-05 HISTORY — DX: Alcohol abuse, uncomplicated: F10.10

## 2011-10-05 LAB — CBC
HCT: 35.7 % — ABNORMAL LOW (ref 39.0–52.0)
Hemoglobin: 10.4 g/dL — ABNORMAL LOW (ref 13.0–17.0)
MCV: 90.2 fL (ref 78.0–100.0)
RBC: 3.96 MIL/uL — ABNORMAL LOW (ref 4.22–5.81)
RDW: 18.5 % — ABNORMAL HIGH (ref 11.5–15.5)
WBC: 22 10*3/uL — ABNORMAL HIGH (ref 4.0–10.5)

## 2011-10-05 LAB — COMPREHENSIVE METABOLIC PANEL
ALT: 109 U/L — ABNORMAL HIGH (ref 0–53)
AST: 141 U/L — ABNORMAL HIGH (ref 0–37)
Albumin: 3 g/dL — ABNORMAL LOW (ref 3.5–5.2)
Alkaline Phosphatase: 88 U/L (ref 39–117)
BUN: 64 mg/dL — ABNORMAL HIGH (ref 6–23)
Chloride: 96 mEq/L (ref 96–112)
Potassium: 5.4 mEq/L — ABNORMAL HIGH (ref 3.5–5.1)
Sodium: 136 mEq/L (ref 135–145)
Total Protein: 7.4 g/dL (ref 6.0–8.3)

## 2011-10-05 LAB — DIFFERENTIAL
Basophils Absolute: 0 10*3/uL (ref 0.0–0.1)
Basophils Relative: 0 % (ref 0–1)
Eosinophils Absolute: 0 10*3/uL (ref 0.0–0.7)
Lymphocytes Relative: 3 % — ABNORMAL LOW (ref 12–46)
Lymphs Abs: 0.7 10*3/uL (ref 0.7–4.0)
Monocytes Relative: 6 % (ref 3–12)

## 2011-10-05 LAB — URINALYSIS, ROUTINE W REFLEX MICROSCOPIC
Glucose, UA: NEGATIVE mg/dL
Ketones, ur: NEGATIVE mg/dL
pH: 5 (ref 5.0–8.0)

## 2011-10-05 LAB — POCT I-STAT 3, ART BLOOD GAS (G3+)
O2 Saturation: 97 %
TCO2: 32 mmol/L (ref 0–100)
pCO2 arterial: 64.3 mmHg (ref 35.0–45.0)
pO2, Arterial: 106 mmHg — ABNORMAL HIGH (ref 80.0–100.0)

## 2011-10-05 LAB — URINE MICROSCOPIC-ADD ON

## 2011-10-05 MED ORDER — FUROSEMIDE 10 MG/ML IJ SOLN
40.0000 mg | Freq: Two times a day (BID) | INTRAMUSCULAR | Status: AC
  Administered 2011-10-05 – 2011-10-07 (×4): 40 mg via INTRAVENOUS
  Filled 2011-10-05 (×3): qty 4

## 2011-10-05 MED ORDER — FLUTICASONE PROPIONATE HFA 220 MCG/ACT IN AERO
1.0000 | INHALATION_SPRAY | Freq: Two times a day (BID) | RESPIRATORY_TRACT | Status: DC
  Administered 2011-10-05 – 2011-10-16 (×21): 1 via RESPIRATORY_TRACT
  Filled 2011-10-05: qty 12

## 2011-10-05 MED ORDER — FLUOXETINE HCL 20 MG PO CAPS
20.0000 mg | ORAL_CAPSULE | Freq: Every day | ORAL | Status: DC
  Administered 2011-10-05 – 2011-10-16 (×11): 20 mg via ORAL
  Filled 2011-10-05 (×12): qty 1

## 2011-10-05 MED ORDER — ALBUTEROL SULFATE (5 MG/ML) 0.5% IN NEBU
2.5000 mg | INHALATION_SOLUTION | RESPIRATORY_TRACT | Status: DC
  Administered 2011-10-05 – 2011-10-08 (×16): 2.5 mg via RESPIRATORY_TRACT
  Filled 2011-10-05 (×16): qty 0.5

## 2011-10-05 MED ORDER — LEVOTHYROXINE SODIUM 150 MCG PO TABS
150.0000 ug | ORAL_TABLET | Freq: Every day | ORAL | Status: DC
  Administered 2011-10-06 – 2011-10-16 (×11): 150 ug via ORAL
  Filled 2011-10-05 (×13): qty 1

## 2011-10-05 MED ORDER — PANTOPRAZOLE SODIUM 40 MG PO TBEC
40.0000 mg | DELAYED_RELEASE_TABLET | Freq: Every day | ORAL | Status: DC
  Administered 2011-10-05 – 2011-10-15 (×9): 40 mg via ORAL
  Filled 2011-10-05 (×9): qty 1

## 2011-10-05 MED ORDER — ALBUTEROL SULFATE (5 MG/ML) 0.5% IN NEBU: 2.5000 mg | INHALATION_SOLUTION | RESPIRATORY_TRACT | Status: DC | PRN

## 2011-10-05 MED ORDER — SODIUM CHLORIDE 0.9 % IJ SOLN
INTRAMUSCULAR | Status: AC
  Administered 2011-10-05: 3 mL
  Filled 2011-10-05: qty 10

## 2011-10-05 MED ORDER — HEPARIN SOD (PORCINE) IN D5W 100 UNIT/ML IV SOLN
2200.0000 [IU]/h | INTRAVENOUS | Status: DC
  Administered 2011-10-05: 1400 [IU]/h via INTRAVENOUS
  Administered 2011-10-06: 1800 [IU]/h via INTRAVENOUS
  Administered 2011-10-06 (×2): 2200 [IU]/h via INTRAVENOUS
  Administered 2011-10-06: 1800 [IU]/h via INTRAVENOUS
  Administered 2011-10-07 – 2011-10-08 (×2): 2200 [IU]/h via INTRAVENOUS
  Filled 2011-10-05 (×9): qty 250

## 2011-10-05 MED ORDER — MOXIFLOXACIN HCL IN NACL 400 MG/250ML IV SOLN
400.0000 mg | INTRAVENOUS | Status: DC
  Administered 2011-10-05 – 2011-10-07 (×3): 400 mg via INTRAVENOUS
  Filled 2011-10-05 (×4): qty 250

## 2011-10-05 MED ORDER — PREDNISONE 20 MG PO TABS: 40.0000 mg | ORAL_TABLET | Freq: Every day | ORAL | Status: DC

## 2011-10-05 MED ORDER — ASPIRIN 325 MG PO TABS
325.0000 mg | ORAL_TABLET | Freq: Every day | ORAL | Status: DC
  Filled 2011-10-05: qty 1

## 2011-10-05 MED ORDER — THIAMINE HCL 100 MG/ML IJ SOLN
100.0000 mg | Freq: Every day | INTRAMUSCULAR | Status: DC
  Administered 2011-10-05: 100 mg via INTRAVENOUS
  Administered 2011-10-06: 13:00:00 via INTRAVENOUS
  Administered 2011-10-07: 100 mg via INTRAVENOUS
  Filled 2011-10-05 (×4): qty 1

## 2011-10-05 MED ORDER — IPRATROPIUM BROMIDE 0.02 % IN SOLN
0.5000 mg | RESPIRATORY_TRACT | Status: DC
  Administered 2011-10-05 – 2011-10-08 (×16): 0.5 mg via RESPIRATORY_TRACT
  Filled 2011-10-05 (×16): qty 2.5

## 2011-10-05 MED ORDER — PREDNISONE 20 MG PO TABS
40.0000 mg | ORAL_TABLET | Freq: Every day | ORAL | Status: DC
  Administered 2011-10-05 – 2011-10-07 (×2): 40 mg via ORAL
  Filled 2011-10-05 (×3): qty 2

## 2011-10-05 MED ORDER — FOLIC ACID 5 MG/ML IJ SOLN
1.0000 mg | Freq: Every day | INTRAMUSCULAR | Status: DC
  Administered 2011-10-05 – 2011-10-08 (×4): 1 mg via INTRAVENOUS
  Filled 2011-10-05 (×4): qty 0.2

## 2011-10-05 MED ORDER — SODIUM CHLORIDE 0.9 % IV SOLN: INTRAVENOUS | Status: DC

## 2011-10-05 MED ORDER — IPRATROPIUM BROMIDE 0.02 % IN SOLN: 0.5000 mg | RESPIRATORY_TRACT | Status: DC | PRN

## 2011-10-05 MED ORDER — ASPIRIN EC 325 MG PO TBEC
325.0000 mg | DELAYED_RELEASE_TABLET | Freq: Every day | ORAL | Status: DC
  Administered 2011-10-05 – 2011-10-06 (×2): 325 mg via ORAL
  Filled 2011-10-05 (×3): qty 1

## 2011-10-05 MED ORDER — SIMVASTATIN 40 MG PO TABS
40.0000 mg | ORAL_TABLET | Freq: Every day | ORAL | Status: DC
  Administered 2011-10-05 – 2011-10-15 (×10): 40 mg via ORAL
  Filled 2011-10-05 (×12): qty 1

## 2011-10-05 MED ORDER — METOPROLOL TARTRATE 50 MG PO TABS
50.0000 mg | ORAL_TABLET | Freq: Two times a day (BID) | ORAL | Status: DC
  Administered 2011-10-05 – 2011-10-06 (×2): 50 mg via ORAL
  Filled 2011-10-05 (×3): qty 1

## 2011-10-05 MED ORDER — DEXTROSE 5 % IV SOLN
2.0000 g | INTRAVENOUS | Status: DC
  Administered 2011-10-05 – 2011-10-07 (×3): 2 g via INTRAVENOUS
  Filled 2011-10-05 (×4): qty 2

## 2011-10-05 MED ORDER — ALLOPURINOL 300 MG PO TABS
300.0000 mg | ORAL_TABLET | Freq: Every day | ORAL | Status: DC
  Filled 2011-10-05: qty 1

## 2011-10-05 MED ORDER — FUROSEMIDE 10 MG/ML IJ SOLN
INTRAMUSCULAR | Status: AC
  Administered 2011-10-05: 21:00:00
  Filled 2011-10-05: qty 4

## 2011-10-05 NOTE — Progress Notes (Signed)
ANTICOAGULATION CONSULT NOTE - Initial Consult  Pharmacy Consult for heparin Indication: chest pain/ACS  No Known Allergies Questionable erythromycin allergy- RN will verify with family in AM  Patient Measurements:   Total Body WT 131kg Ht 70 in       Medical History: Past Medical History  Diagnosis Date  . CHRONIC OBSTRUCTIVE PULMONARY DISEASE 06/20/2009  . OBSTRUCTIVE SLEEP APNEA 06/20/2009  . CAROTID STENOSIS 06/20/2009  . DM 06/20/2009  . CAD 06/20/2009  . HYPERLIPIDEMIA 06/20/2009  . HYPERTENSION 06/20/2009  . Overweight   . Hypothyroidism   . Low back pain    A-Fib - no coumadin per cards outpt note d/t xs ETOH abuse  Assessment: Pt transferred from [facility] hospital.  Admitted for ACS per increased cardiac enzymes, no CP or ECG changes.  Also volume overloaded - LE/scrotal edema  - and Hx A-Fib will anticoagulate while hospitalized but not Coumadin candidate in past d/t xs ETOH abuse.  Heparin drip started at [facility] but stopped on [facility] admission b/c we have different pumps and heparin bag concentration.  Heparin has been off for 1 hr  Possile aspiration pna s/p lethargic today per family after drinking 4 bottles of wild [place] yesterday.   Emperic ABX   Goal of Therapy:  Heparin level 0.3-0.7 units/ml   Plan:   1) Heparin 1400 uts/hr 2)  Check 6hr HL and CBC and daily 3)  Rocephin 2Gm q24 and Moxifloxin 400mg  q24 for pna  Marcelino Scot 10/05/2011,8:10 PM

## 2011-10-05 NOTE — H&P (Signed)
Name: RACHIT GRIM MRN: [id number] DOB: 07-Sep-1946    LOS: 0  PCCM ADMISSION NOTE  History of Present Illness: 65 yo WM with COPD, OSA and CAD brought to [facility] on 12/25 with altered mental status and respiratory difficulties.  Reports drinking four bottles of hard liquor the night before.  ED workup revealed RLL infiltrate, elevated cardiac enzymes/BNP and hypercarbic respiratory failure.  Heparin gtt was started and patient was placed on BiPAP. Transferred to [facility] ICU for further management.   Lines / Drains: 12/25  Foley  Cultures: 12/25  UC 12/25  BC  Antibiotics: 12/25  Ceftriaxone 12/25  Moxyfloxacin  Tests / Events: 12/25  Presnted to [facility] with AMS/SOB.  Past Medical History  Diagnosis Date  . CHRONIC OBSTRUCTIVE PULMONARY DISEASE 06/20/2009  . OBSTRUCTIVE SLEEP APNEA 06/20/2009  . CAROTID STENOSIS 06/20/2009  . DM 06/20/2009  . CAD 06/20/2009  . HYPERLIPIDEMIA 06/20/2009  . HYPERTENSION 06/20/2009  . Overweight   . Hypothyroidism   . Low back pain    Past Surgical History  Procedure Date  . Carotid endarterectomy 2002  . Coronary artery bypass graft    Prior to Admission medications   Medication Sig Start Date End Date Taking? Authorizing Provider  allopurinol (ZYLOPRIM) 300 MG tablet Take 300 mg by mouth daily.      Historical Provider, MD  aspirin 325 MG tablet Take 325 mg by mouth daily.      Historical Provider, MD  FLUoxetine (PROZAC) 20 MG capsule Take 20 mg by mouth daily.      Historical Provider, MD  fluticasone (FLOVENT HFA) 220 MCG/ACT inhaler Inhale 1 puff into the lungs 2 (two) times daily.      Historical Provider, MD  levothyroxine (SYNTHROID, LEVOTHROID) 150 MCG tablet Take 150 mcg by mouth daily.      Historical Provider, MD  magnesium oxide (MAG-OX) 400 MG tablet Take 400 mg by mouth daily.      Historical Provider, MD  metoprolol (LOPRESSOR) 50 MG tablet Take 50 mg by mouth 2 (two) times daily.      Historical Provider, MD    omeprazole (PRILOSEC) 20 MG capsule Take 20 mg by mouth daily.      Historical Provider, MD  potassium chloride SA (K-DUR,KLOR-CON) 20 MEQ tablet Take 20 mEq by mouth daily.      Historical Provider, MD  simvastatin (ZOCOR) 40 MG tablet Take 40 mg by mouth at bedtime.      Historical Provider, MD  spironolactone (ALDACTONE) 50 MG tablet Take 50 mg by mouth daily.      Historical Provider, MD   Allergies No Known Allergies  Family History No family history on file.  Social History  reports that he quit smoking about 43 years ago. He does not have any smokeless tobacco history on file. His alcohol and drug histories not on file.  Review Of Systems  11 points review of systems is negative with an exception of listed in HPI.  Vital Signs:  T 97.9 P 94 R 19 BP 147/92 SpO2 94 (5L [place])     Physical Examination: General:  Agitated, but not in acute distress Neuro:  Confused, nonfocal   HEENT:  PERRL Neck:  Supple, no JVD   Cardiovascular:  Irregular rhythm, controlled rate, no murmurs Lungs:  Bilateral diminished air entry, basal rales, few exp wheezes Abdomen:  Obese, lage panus, bowel sounds present Musculoskeletal:  Chronic stasis dermatitis / bilateral pitting edema lower extremities Skin:  Multiple abrasions, anasarca  Ventilator settings:   Labs and Imaging:  Reviewed.  Please refer to the Assessment and Plan section for relevant results.  Assessment and Plan:  Metabolic / alcoholic encephalopathy.  History of alcohol abuse.  High risk of withdrawal. -->  Thiamine -->  Folate -->  CIWA q4h -->  Prozac (preadmission)  Acute exacerbation of COPD. -->  Bronchodilators -->  Flovent (preadmission) -->  Prednisone -->  Supplemental oxygen  CAP  versus spiration pneumonia.  RLL infiltrate on outside CXR. -->  Avelox / Ceftriaxone, may widen coverage based on clinical course, labs, imaging -->  Cultures / PCT  Elevated cardiac enzymes (ACS vs. demand ischemia).   ECG>>>atrial fibrillation, controlled rate, no ST-T changes. -->  Trend cardiac enzymes -->  Heparin gtt per pharmacy -->  ASA, Metoprolol, Zocor as preadmission -->  May need coronary imaging before discharge  Acute on chronic systolic congestive heart failure.  Elevated BNP in ED.  Anasarca.  -->  Lasix -->  TTE  Atrial fibrillation with controlled rate.  Not clear if new. -->  Heparin as above  Acute on chronic hypercarbic respiratory failure on the background of OSA. -->  ABG -->  BiPAP at night  Best practices / Disposition -->ICU status under PCCM -->full code -->DVT Px is not indicated as on Heparin gtt for ACS -->GI Px is not indicated as on PPI prior to admission -->NPO -->family is not available for update  Orlean Bradford, M.D. Pulmonary and Critical Care Medicine [facility] Cell: [phone] Pager: [phone]  10/05/2011, 7:40 PM

## 2011-10-06 ENCOUNTER — Inpatient Hospital Stay (HOSPITAL_COMMUNITY): Payer: Medicare Other

## 2011-10-06 ENCOUNTER — Encounter (HOSPITAL_COMMUNITY): Payer: Self-pay | Admitting: *Deleted

## 2011-10-06 DIAGNOSIS — I369 Nonrheumatic tricuspid valve disorder, unspecified: Secondary | ICD-10-CM

## 2011-10-06 DIAGNOSIS — J158 Pneumonia due to other specified bacteria: Secondary | ICD-10-CM

## 2011-10-06 DIAGNOSIS — F1010 Alcohol abuse, uncomplicated: Secondary | ICD-10-CM | POA: Insufficient documentation

## 2011-10-06 DIAGNOSIS — I214 Non-ST elevation (NSTEMI) myocardial infarction: Secondary | ICD-10-CM

## 2011-10-06 DIAGNOSIS — J9600 Acute respiratory failure, unspecified whether with hypoxia or hypercapnia: Secondary | ICD-10-CM

## 2011-10-06 DIAGNOSIS — I501 Left ventricular failure: Secondary | ICD-10-CM

## 2011-10-06 DIAGNOSIS — I480 Paroxysmal atrial fibrillation: Secondary | ICD-10-CM | POA: Insufficient documentation

## 2011-10-06 LAB — POCT I-STAT 3, ART BLOOD GAS (G3+)
Bicarbonate: 29.1 mEq/L — ABNORMAL HIGH (ref 20.0–24.0)
TCO2: 31 mmol/L (ref 0–100)
pCO2 arterial: 48.4 mmHg — ABNORMAL HIGH (ref 35.0–45.0)
pH, Arterial: 7.387 (ref 7.350–7.450)

## 2011-10-06 LAB — CARDIAC PANEL(CRET KIN+CKTOT+MB+TROPI)
CK, MB: 7.6 ng/mL (ref 0.3–4.0)
Relative Index: 3.2 — ABNORMAL HIGH (ref 0.0–2.5)
Relative Index: 3 — ABNORMAL HIGH (ref 0.0–2.5)
Total CK: 281 U/L — ABNORMAL HIGH (ref 7–232)
Total CK: 253 U/L — ABNORMAL HIGH (ref 7–232)

## 2011-10-06 LAB — CBC
MCH: 26.1 pg (ref 26.0–34.0)
MCHC: 29.5 g/dL — ABNORMAL LOW (ref 30.0–36.0)
Platelets: 168 10*3/uL (ref 150–400)

## 2011-10-06 LAB — PHOSPHORUS: Phosphorus: 4.8 mg/dL — ABNORMAL HIGH (ref 2.3–4.6)

## 2011-10-06 LAB — GLUCOSE, CAPILLARY
Glucose-Capillary: 224 mg/dL — ABNORMAL HIGH (ref 70–99)
Glucose-Capillary: 250 mg/dL — ABNORMAL HIGH (ref 70–99)
Glucose-Capillary: 233 mg/dL — ABNORMAL HIGH (ref 70–99)

## 2011-10-06 LAB — BASIC METABOLIC PANEL
Calcium: 9.2 mg/dL (ref 8.4–10.5)
GFR calc non Af Amer: 39 mL/min — ABNORMAL LOW (ref >90)
Sodium: 136 mEq/L (ref 135–145)

## 2011-10-06 LAB — HEPARIN LEVEL (UNFRACTIONATED): Heparin Unfractionated: 0.12 IU/mL — ABNORMAL LOW (ref 0.30–0.70)

## 2011-10-06 LAB — MAGNESIUM: Magnesium: 2.1 mg/dL (ref 1.5–2.5)

## 2011-10-06 MED ORDER — INSULIN ASPART 100 UNIT/ML ~~LOC~~ SOLN
0.0000 [IU] | SUBCUTANEOUS | Status: DC
  Administered 2011-10-06: 5 [IU] via SUBCUTANEOUS
  Administered 2011-10-06: 8 [IU] via SUBCUTANEOUS
  Administered 2011-10-06: 3 [IU] via SUBCUTANEOUS
  Administered 2011-10-06: 2 [IU] via SUBCUTANEOUS
  Administered 2011-10-06: 8 [IU] via SUBCUTANEOUS
  Administered 2011-10-06 – 2011-10-07 (×2): 3 [IU] via SUBCUTANEOUS
  Administered 2011-10-07 (×2): 8 [IU] via SUBCUTANEOUS
  Administered 2011-10-07: 2 [IU] via SUBCUTANEOUS
  Administered 2011-10-08: 3 [IU] via SUBCUTANEOUS
  Administered 2011-10-08: 2 [IU] via SUBCUTANEOUS
  Administered 2011-10-08: 8 [IU] via SUBCUTANEOUS
  Administered 2011-10-08: 2 [IU] via SUBCUTANEOUS
  Administered 2011-10-08 (×2): 5 [IU] via SUBCUTANEOUS
  Administered 2011-10-08 – 2011-10-09 (×2): 3 [IU] via SUBCUTANEOUS
  Administered 2011-10-09: 5 [IU] via SUBCUTANEOUS
  Administered 2011-10-09: 2 [IU] via SUBCUTANEOUS
  Administered 2011-10-09: 5 [IU] via SUBCUTANEOUS
  Administered 2011-10-09 – 2011-10-10 (×2): 2 [IU] via SUBCUTANEOUS
  Administered 2011-10-10: 11 [IU] via SUBCUTANEOUS
  Administered 2011-10-10: 2 [IU] via SUBCUTANEOUS
  Administered 2011-10-10: 8 [IU] via SUBCUTANEOUS
  Administered 2011-10-10: 3 [IU] via SUBCUTANEOUS
  Filled 2011-10-06 (×2): qty 3

## 2011-10-06 MED ORDER — SODIUM CHLORIDE 0.9 % IJ SOLN
INTRAMUSCULAR | Status: AC
  Administered 2011-10-07: 10 mL
  Filled 2011-10-06: qty 20

## 2011-10-06 MED ORDER — SODIUM CHLORIDE 0.9 % IV SOLN
INTRAVENOUS | Status: DC
  Administered 2011-10-06 (×2): via INTRAVENOUS
  Administered 2011-10-10: 20 mL via INTRAVENOUS
  Administered 2011-10-10: 250 mL via INTRAVENOUS
  Administered 2011-10-10 – 2011-10-12 (×2): via INTRAVENOUS

## 2011-10-06 MED ORDER — LORAZEPAM 2 MG/ML IJ SOLN
2.0000 mg | INTRAMUSCULAR | Status: DC | PRN
  Administered 2011-10-06 (×2): 2 mg via INTRAVENOUS
  Filled 2011-10-06: qty 1

## 2011-10-06 MED ORDER — HEPARIN BOLUS VIA INFUSION
3000.0000 [IU] | Freq: Once | INTRAVENOUS | Status: AC
  Administered 2011-10-06: 3000 [IU] via INTRAVENOUS
  Filled 2011-10-06: qty 3000

## 2011-10-06 MED ORDER — LORAZEPAM 2 MG/ML IJ SOLN
INTRAMUSCULAR | Status: AC
  Administered 2011-10-06: 2 mg
  Filled 2011-10-06: qty 1

## 2011-10-06 MED ORDER — PNEUMOCOCCAL 13-VAL CONJ VACC IM SUSP
0.5000 mL | INTRAMUSCULAR | Status: AC
  Administered 2011-10-07: 0.5 mL via INTRAMUSCULAR
  Filled 2011-10-06: qty 0.5

## 2011-10-06 MED ORDER — CHLORHEXIDINE GLUCONATE 0.12 % MT SOLN
15.0000 mL | Freq: Two times a day (BID) | OROMUCOSAL | Status: DC
Start: 2011-10-06 — End: 2011-10-10
  Administered 2011-10-06 – 2011-10-10 (×7): 15 mL via OROMUCOSAL
  Filled 2011-10-06 (×7): qty 15

## 2011-10-06 MED ORDER — METOPROLOL TARTRATE 1 MG/ML IV SOLN
5.0000 mg | Freq: Four times a day (QID) | INTRAVENOUS | Status: DC
  Administered 2011-10-06 – 2011-10-07 (×2): 5 mg via INTRAVENOUS
  Filled 2011-10-06 (×6): qty 5

## 2011-10-06 MED ORDER — BIOTENE DRY MOUTH MT LIQD
15.0000 mL | Freq: Two times a day (BID) | OROMUCOSAL | Status: DC
  Administered 2011-10-06 – 2011-10-10 (×8): 15 mL via OROMUCOSAL

## 2011-10-06 MED ORDER — LORAZEPAM 2 MG/ML IJ SOLN
1.0000 mg | INTRAMUSCULAR | Status: DC | PRN
  Administered 2011-10-06 – 2011-10-07 (×3): 1 mg via INTRAVENOUS
  Filled 2011-10-06 (×3): qty 1

## 2011-10-06 NOTE — Progress Notes (Signed)
Call placed to [facility], spk with Loraine Leriche, RN, per CIWA protocol relayed critical values of B/P142/92 and HR 102.  Message to be relayed to Dr. Darrick Penna.

## 2011-10-06 NOTE — Progress Notes (Signed)
ANTICOAGULATION CONSULT NOTE - Follow Up Consult  Pharmacy Consult for Heparin Indication: chest pain/ACS  Allergies  Allergen Reactions  . Erythromycin     Took as a child for tooth ache and unable to recall reaction   Admit Complaint: Pharmacist System-Based Medication Review: Anticoagulation: NSTEMI, heparin remains < goal 0.3-0.7 on 1800 units/hr, given total body weight of 131 kg, will increase heparin to 2200 units/hr (17 units/kg TBW/hr) Infectious Disease: CAP v asp pneumonia, emipric Ceftriaxone and moxifloxacin D#2 Cardiovascular: NSTEMI, HF, Afib, ASA, hepain, metoprolol, simva, ECHO result pending Fluid/Electrolytes / Nutrition: Hyperkalemia Endocrine: hypothyroidism, levothyroxine 150 mcg daily Neurology: Metabolic encephalopathy, alcohol abuse, CIWA, prozac, thiamine, folate Pulmonary: COPD, prednisone 40 mg daily PTA Medication Issues Allopurinol, spironolactone 20 mg daily Best Practices: IV heparin, protonix, mouth care protocol  Medications:  Prescriptions prior to admission  Medication Sig Dispense Refill  . allopurinol (ZYLOPRIM) 300 MG tablet Take 300 mg by mouth daily.        Marland Kitchen aspirin 325 MG tablet Take 325 mg by mouth daily.        Marland Kitchen FLUoxetine (PROZAC) 20 MG capsule Take 20 mg by mouth daily.        . fluticasone (FLOVENT HFA) 220 MCG/ACT inhaler Inhale 1 puff into the lungs 2 (two) times daily.        Marland Kitchen levothyroxine (SYNTHROID, LEVOTHROID) 150 MCG tablet Take 150 mcg by mouth daily.        . magnesium oxide (MAG-OX) 400 MG tablet Take 400 mg by mouth daily.        . metoprolol (LOPRESSOR) 50 MG tablet Take 50 mg by mouth 2 (two) times daily.        Marland Kitchen omeprazole (PRILOSEC) 20 MG capsule Take 20 mg by mouth daily.        . potassium chloride SA (K-DUR,KLOR-CON) 20 MEQ tablet Take 20 mEq by mouth daily.        . simvastatin (ZOCOR) 40 MG tablet Take 40 mg by mouth at bedtime.        Marland Kitchen spironolactone (ALDACTONE) 50 MG tablet Take 50 mg by mouth daily.          Marland Kitchen DISCONTD: albuterol (ACCUNEB) 0.63 MG/3ML nebulizer solution Take 1 ampule by nebulization daily as needed.        Marland Kitchen DISCONTD: Fish Oil-Cholecalciferol (FISH OIL + D3) 1200-1000 MG-UNIT CAPS Take by mouth. 2 tabs daily       . DISCONTD: furosemide (LASIX) 80 MG tablet 4 tabs in the am      . DISCONTD: glipiZIDE (GLUCOTROL) 10 MG tablet Take 10 mg by mouth 2 (two) times daily before a meal.        . DISCONTD: Ipratropium-Albuterol (COMBIVENT IN) Inhale into the lungs. As directed       . DISCONTD: Multiple Vitamin (MULTIVITAMIN) capsule Take 1 capsule by mouth daily.         Assessment: 65 yo M on heparin for NSTEMI, level reported less than goal of 0.3-0.7, will give 3000 units IV x 1 then increase infusion to 2200 units/hr  Goal of Therapy:  Heparin level 0.3-0.7 units/ml   Plan:  1. Heparin 3000 units IV x 1 then 2. Heparin 2200 units/hr 3. Check a heparin level 6h after rate change   Patient Measurements: Height: 5\' 10"  (177.8 cm) Weight: 289 lb 3.9 oz (131.2 kg) IBW/kg (Calculated) : 73  Heparin dosing Weight: 103 kg  Vital Signs: Temp: 98.1 F (36.7 C) (12/26 1225) Temp src: Oral (12/26  1225) BP: 124/72 mmHg (12/26 1500) Pulse Rate: 76  (12/26 1500)  Labs:  Basename 10/06/11 1349 10/06/11 0530 10/05/11 2013 10/05/11 0001  HGB -- 9.9* 10.4* --  HCT -- 33.6* 35.7* --  PLT -- 168 178 --  APTT -- -- -- --  LABPROT -- -- -- --  INR -- -- -- --  HEPARINUNFRC 0.22* 0.12* -- --  CREATININE -- 1.73* 1.85* --  CKTOTAL 224 253* -- 281*  CKMB 5.1* 7.6* -- 9.1*  TROPONINI 0.53* 1.18* -- 1.32*   Estimated Creatinine Clearance: 56.4 ml/min (by C-G formula based on Cr of 1.73).  [name] Christine Virginia Crews 10/06/2011,3:27 PM

## 2011-10-06 NOTE — Progress Notes (Signed)
RT Note: Placed pt on Auto CPAP with full face mask and 4L 02 bleed in. Pt tolerating well, RT will monitor.

## 2011-10-06 NOTE — Progress Notes (Signed)
Summary:  Pt received from EMS [place] into room 2108 at 1930.  Pt had a 16 Fr foley in place.  Pt c/o sever pain with foley despite irrigation of 20 mL NS.  Consulted with MD.  It was decided to change foley.  16 Fr foley was replaced with 14 Fr foley using sterile technique and 1 RN assist.  Pt continued to c/o foley pain until about 2200 when he went to sleep.  Pt also arrived with 2 PIV.  One in his RUE post wrist, the other RUE just below his ac.  Both IV's flushed without difficulty but when Avelox started to infuse into RAC it infiltrated.  2 attempts were made to start another IV unsuccessfully.  IV team consulted and new IV obtained.  Heparin drip started.

## 2011-10-06 NOTE — Consult Note (Signed)
CARDIOLOGY CONSULT NOTE  Patient ID: [name] MRN: [id number], DOB/AGE: [birth year]   Admit date: 10/05/2011 Date of Consult: 10/06/2011   Primary Physician: [name], MD Primary Cardiologist: Earney Hamburg  Pt. Profile:   65 y/o male w/ h/o CAD admitted 12/25 with AMS and dyspnea found to have RLL infiltrate and elevated CE.  Problem List: Past Medical History  Diagnosis Date  . CHRONIC OBSTRUCTIVE PULMONARY DISEASE 06/20/2009  . OBSTRUCTIVE SLEEP APNEA 06/20/2009  . CAROTID STENOSIS 06/20/2009    A. 08/2001 s/p L CEA;  B.   09/14/11 - Carotid U/S - 40-59% bilateral stenosis, left CEA patch angioplasty is patent  . DM 06/20/2009  . CAD 06/20/2009    A.  08/2000 - s/p CABG x 4 - LIMA-LAD, Left Radial-OM, VG-DIAG, VG-RCA;  B. Neg. MV  2010  . HYPERLIPIDEMIA 06/20/2009  . HYPERTENSION 06/20/2009  . Overweight   . Hypothyroidism   . Low back pain   . Asthma     as child  . Pneumonia   . Atrial fibrillation     Not felt to be coumadin candidate 2/2 ETOH use.  Marland Kitchen Hypothyroidism   . ETOH abuse   . History of tobacco abuse     remote - quit 1970    Past Surgical History  Procedure Date  . Carotid endarterectomy 2002    left  . Coronary artery bypass graft     x 4 - 2001     Allergies:  Allergies  Allergen Reactions  . Erythromycin     Took as a child for tooth ache and unable to recall reaction    HPI:   65 y/o male with the above problem list.  Difficult historian - doesn't really recall what events lead to his admission, other than that he drank too much.  His dtr states that he rarely drinks but on Christmas Eve, drank quite a bit and went to bed drunk (pt says he regularly drinks a pint of vodka).  His dtr got up early on Christmas day and went in to check on him and noted shallow breathing.  She checked his pulse-ox and noted it to initially be in the 80's but it later fell lower (60's).  At that point, she called EMS and pt was taken to [facility].   There, he was found to have a RLL infiltrate, elevated bnp (12,237), PCO2 of 67 by abg, and elevated CE.  He was placed on BiPap and decision was made to tx to [facility] for further eval and mgmt.  Here, he is w/o chest pain or sob.  He is somewhat agitated.   Inpatient Medications:     . ipratropium  0.5 mg Nebulization Q4H   And  . albuterol  2.5 mg Nebulization Q4H  . antiseptic oral rinse  15 mL Mouth Rinse q12n4p  . aspirin EC  325 mg Oral Daily  . cefTRIAXone (ROCEPHIN)  IV  2 g Intravenous Q24H  . chlorhexidine  15 mL Mouth/Throat BID  . FLUoxetine  20 mg Oral Daily  . fluticasone  1 puff Inhalation BID  . folic acid  1 mg Intravenous Daily  . furosemide      . furosemide  40 mg Intravenous BID  . heparin  3,000 Units Intravenous Once  . insulin aspart  0-15 Units Subcutaneous Q4H  . levothyroxine  150 mcg Oral Q breakfast  . metoprolol  50 mg Oral BID  . moxifloxacin  400 mg Intravenous Q24H  .  pantoprazole  40 mg Oral Q1200  . pneumococcal 13-valent conjugate vaccine  0.5 mL Intramuscular Tomorrow-1000  . predniSONE  40 mg Oral Q breakfast  . simvastatin  40 mg Oral QHS  . sodium chloride      . thiamine  100 mg Intravenous Daily  . DISCONTD: allopurinol  300 mg Oral Daily  . DISCONTD: aspirin  325 mg Oral Daily  . DISCONTD: predniSONE  40 mg Oral Q breakfast    Family History  Problem Relation Age of Onset  . Stroke Mother     ?  . Stroke Father     ?     History   Social History  . Marital Status: Widowed    Spouse Name: N/A    Number of Children: N/A  . Years of Education: N/A   Occupational History  . retired    Social History Main Topics  . Smoking status: Former Smoker    Quit date: 10/11/1968  . Smokeless tobacco: Not on file  . Alcohol Use: Yes     wild [place] daily;  also reports regularly drinking a pint of vodka.  . Drug Use: Yes    Special: Marijuana     last 1 year ago  . Sexually Active: Not on file   Other Topics Concern  . Not on  file   Social History Narrative   Lives in [place] with dtr, son-in-law     Review of Systems: General: negative for chills, fever, night sweats or weight changes.  Cardiovascular: negative for chest pain, dyspnea on exertion, edema, orthopnea, palpitations, paroxysmal nocturnal dyspnea. Dermatological: negative for rash Respiratory:+++ cough and wheezing Urologic: negative for hematuria Abdominal: negative for nausea, vomiting, diarrhea, bright red blood per rectum, melena, or hematemesis Neurologic: negative for visual changes, syncope, or dizziness All other systems reviewed and are otherwise negative except as noted above.  Physical Exam: Blood pressure 110/65, pulse 84, temperature 98.1 F (36.7 C), temperature source Oral, resp. rate 22, height 5\' 10"  (1.778 m), weight 289 lb 3.9 oz (131.2 kg), SpO2 97.00%.  General: Well developed, well nourished, in no acute distress. Head: Normocephalic, atraumatic, sclera non-icteric, no xanthomas, nares are without discharge.  Neck: Supple without bruits.  JVP approx 12cm. Lungs:  Resp regular and unlabored, bibasilar crackles. Heart: irreg, irreg, no s3, s4, or murmurs. Abdomen: Soft, non-tender, non-distended, BS + x 4.  Msk:  Strength and tone appears normal for age. Extremities: No clubbing, cyanosis.  1+ bilat edema with chronic venous stasis changes.  DP/PT/Radials 1+ and equal bilaterally. Neuro: Alert and oriented to person and place. Moves all extremities spontaneously. Psych: Normal affect.   Labs:   Results for orders placed during the hospital encounter of 10/05/11 (from the past 72 hour(s))  LACTIC ACID, PLASMA     Status: Normal   Collection Time   10/05/11 12:00 AM      Component Value Range Comment   Lactic Acid, Venous 2.0  0.5 - 2.2 (mmol/L)   CARDIAC PANEL(CRET KIN+CKTOT+MB+TROPI)     Status: Abnormal   Collection Time   10/05/11 12:01 AM      Component Value Range Comment   Total CK 281 (*) 7 - 232 (U/L)      CK, MB 9.1 (*) 0.3 - 4.0 (ng/mL)    Troponin I 1.32 (*) <0.30 (ng/mL)    Relative Index 3.2 (*) 0.0 - 2.5    GLUCOSE, CAPILLARY     Status: Abnormal   Collection Time  10/05/11  7:32 PM      Component Value Range Comment   Glucose-Capillary 250 (*) 70 - 99 (mg/dL)   MRSA PCR SCREENING     Status: Normal   Collection Time   10/05/11  7:36 PM      Component Value Range Comment   MRSA by PCR NEGATIVE  NEGATIVE    CBC     Status: Abnormal   Collection Time   10/05/11  8:13 PM      Component Value Range Comment   WBC 22.0 (*) 4.0 - 10.5 (K/uL)    RBC 3.96 (*) 4.22 - 5.81 (MIL/uL)    Hemoglobin 10.4 (*) 13.0 - 17.0 (g/dL)    HCT 96.0 (*) 45.4 - 52.0 (%)    MCV 90.2  78.0 - 100.0 (fL)    MCH 26.3  26.0 - 34.0 (pg)    MCHC 29.1 (*) 30.0 - 36.0 (g/dL)    RDW 09.8 (*) 11.9 - 15.5 (%)    Platelets 178  150 - 400 (K/uL)   DIFFERENTIAL     Status: Abnormal   Collection Time   10/05/11  8:13 PM      Component Value Range Comment   Neutrophils Relative 91 (*) 43 - 77 (%)    Neutro Abs 20.0 (*) 1.7 - 7.7 (K/uL)    Lymphocytes Relative 3 (*) 12 - 46 (%)    Lymphs Abs 0.7  0.7 - 4.0 (K/uL)    Monocytes Relative 6  3 - 12 (%)    Monocytes Absolute 1.3 (*) 0.1 - 1.0 (K/uL)    Eosinophils Relative 0  0 - 5 (%)    Eosinophils Absolute 0.0  0.0 - 0.7 (K/uL)    Basophils Relative 0  0 - 1 (%)    Basophils Absolute 0.0  0.0 - 0.1 (K/uL)   COMPREHENSIVE METABOLIC PANEL     Status: Abnormal   Collection Time   10/05/11  8:13 PM      Component Value Range Comment   Sodium 136  135 - 145 (mEq/L)    Potassium 5.4 (*) 3.5 - 5.1 (mEq/L)    Chloride 96  96 - 112 (mEq/L)    CO2 27  19 - 32 (mEq/L)    Glucose, Bld 254 (*) 70 - 99 (mg/dL)    BUN 64 (*) 6 - 23 (mg/dL)    Creatinine, Ser 1.47 (*) 0.50 - 1.35 (mg/dL)    Calcium 9.5  8.4 - 10.5 (mg/dL)    Total Protein 7.4  6.0 - 8.3 (g/dL)    Albumin 3.0 (*) 3.5 - 5.2 (g/dL)    AST 829 (*) 0 - 37 (U/L)    ALT 109 (*) 0 - 53 (U/L)    Alkaline  Phosphatase 88  39 - 117 (U/L)    Total Bilirubin 0.2 (*) 0.3 - 1.2 (mg/dL)    GFR calc non Af Amer 36 (*) >90 (mL/min)    GFR calc Af Amer 42 (*) >90 (mL/min)   PROCALCITONIN     Status: Normal   Collection Time   10/05/11  8:13 PM      Component Value Range Comment   Procalcitonin 0.61     MAGNESIUM     Status: Normal   Collection Time   10/05/11  8:13 PM      Component Value Range Comment   Magnesium 2.4  1.5 - 2.5 (mg/dL)   PHOSPHORUS     Status: Abnormal   Collection Time   10/05/11  8:13 PM      Component Value Range Comment   Phosphorus 6.2 (*) 2.3 - 4.6 (mg/dL)   URINALYSIS, ROUTINE W REFLEX MICROSCOPIC     Status: Abnormal   Collection Time   10/05/11  8:44 PM      Component Value Range Comment   Color, Urine YELLOW  YELLOW     APPearance CLOUDY (*) CLEAR     Specific Gravity, Urine 1.016  1.005 - 1.030     pH 5.0  5.0 - 8.0     Glucose, UA NEGATIVE  NEGATIVE (mg/dL)    Hgb urine dipstick LARGE (*) NEGATIVE     Bilirubin Urine NEGATIVE  NEGATIVE     Ketones, ur NEGATIVE  NEGATIVE (mg/dL)    Protein, ur 30 (*) NEGATIVE (mg/dL)    Urobilinogen, UA 1.0  0.0 - 1.0 (mg/dL)    Nitrite NEGATIVE  NEGATIVE     Leukocytes, UA SMALL (*) NEGATIVE    URINE MICROSCOPIC-ADD ON     Status: Abnormal   Collection Time   10/05/11  8:44 PM      Component Value Range Comment   WBC, UA 7-10  <3 (WBC/hpf)    RBC / HPF TOO NUMEROUS TO COUNT  <3 (RBC/hpf)    Bacteria, UA FEW (*) RARE     Casts HYALINE CASTS (*) NEGATIVE    POCT I-STAT 3, BLOOD GAS (G3+)     Status: Abnormal   Collection Time   10/05/11  9:31 PM      Component Value Range Comment   pH, Arterial 7.282 (*) 7.350 - 7.450     pCO2 arterial 64.3 (*) 35.0 - 45.0 (mmHg)    pO2, Arterial 106.0 (*) 80.0 - 100.0 (mmHg)    Bicarbonate 30.3 (*) 20.0 - 24.0 (mEq/L)    TCO2 32  0 - 100 (mmol/L)    O2 Saturation 97.0      Acid-Base Excess 2.0  0.0 - 2.0 (mmol/L)    Collection site RADIAL, ALLEN'S TEST ACCEPTABLE      Drawn by  RT      Sample type ARTERIAL      Comment NOTIFIED PHYSICIAN     GLUCOSE, CAPILLARY     Status: Abnormal   Collection Time   10/06/11 12:12 AM      Component Value Range Comment   Glucose-Capillary 211 (*) 70 - 99 (mg/dL)   GLUCOSE, CAPILLARY     Status: Abnormal   Collection Time   10/06/11  4:00 AM      Component Value Range Comment   Glucose-Capillary 188 (*) 70 - 99 (mg/dL)   CARDIAC PANEL(CRET KIN+CKTOT+MB+TROPI)     Status: Abnormal   Collection Time   10/06/11  5:30 AM      Component Value Range Comment   Total CK 253 (*) 7 - 232 (U/L)    CK, MB 7.6 (*) 0.3 - 4.0 (ng/mL) CRITICAL VALUE NOTED.  VALUE IS CONSISTENT WITH PREVIOUSLY REPORTED AND CALLED VALUE.   Troponin I 1.18 (*) <0.30 (ng/mL)    Relative Index 3.0 (*) 0.0 - 2.5    LACTIC ACID, PLASMA     Status: Normal   Collection Time   10/06/11  5:30 AM      Component Value Range Comment   Lactic Acid, Venous 1.8  0.5 - 2.2 (mmol/L)   CBC     Status: Abnormal   Collection Time   10/06/11  5:30 AM      Component Value Range Comment  WBC 12.7 (*) 4.0 - 10.5 (K/uL)    RBC 3.80 (*) 4.22 - 5.81 (MIL/uL)    Hemoglobin 9.9 (*) 13.0 - 17.0 (g/dL)    HCT 16.1 (*) 09.6 - 52.0 (%)    MCV 88.4  78.0 - 100.0 (fL)    MCH 26.1  26.0 - 34.0 (pg)    MCHC 29.5 (*) 30.0 - 36.0 (g/dL)    RDW 04.5 (*) 40.9 - 15.5 (%)    Platelets 168  150 - 400 (K/uL)   BASIC METABOLIC PANEL     Status: Abnormal   Collection Time   10/06/11  5:30 AM      Component Value Range Comment   Sodium 136  135 - 145 (mEq/L)    Potassium 5.5 (*) 3.5 - 5.1 (mEq/L)    Chloride 97  96 - 112 (mEq/L)    CO2 29  19 - 32 (mEq/L)    Glucose, Bld 207 (*) 70 - 99 (mg/dL)    BUN 68 (*) 6 - 23 (mg/dL)    Creatinine, Ser 8.11 (*) 0.50 - 1.35 (mg/dL)    Calcium 9.2  8.4 - 10.5 (mg/dL)    GFR calc non Af Amer 39 (*) >90 (mL/min)    GFR calc Af Amer 45 (*) >90 (mL/min)   MAGNESIUM     Status: Normal   Collection Time   10/06/11  5:30 AM      Component Value Range  Comment   Magnesium 2.1  1.5 - 2.5 (mg/dL)   PHOSPHORUS     Status: Abnormal   Collection Time   10/06/11  5:30 AM      Component Value Range Comment   Phosphorus 4.8 (*) 2.3 - 4.6 (mg/dL)   HEPARIN LEVEL (UNFRACTIONATED)     Status: Abnormal   Collection Time   10/06/11  5:30 AM      Component Value Range Comment   Heparin Unfractionated 0.12 (*) 0.30 - 0.70 (IU/mL)   POCT I-STAT 3, BLOOD GAS (G3+)     Status: Abnormal   Collection Time   10/06/11  6:13 AM      Component Value Range Comment   pH, Arterial 7.387  7.350 - 7.450     pCO2 arterial 48.4 (*) 35.0 - 45.0 (mmHg)    pO2, Arterial 83.0  80.0 - 100.0 (mmHg)    Bicarbonate 29.1 (*) 20.0 - 24.0 (mEq/L)    TCO2 31  0 - 100 (mmol/L)    O2 Saturation 96.0      Acid-Base Excess 3.0 (*) 0.0 - 2.0 (mmol/L)    Collection site RADIAL, ALLEN'S TEST ACCEPTABLE      Drawn by RT      Sample type ARTERIAL     GLUCOSE, CAPILLARY     Status: Abnormal   Collection Time   10/06/11  7:59 AM      Component Value Range Comment   Glucose-Capillary 224 (*) 70 - 99 (mg/dL)   GLUCOSE, CAPILLARY     Status: Abnormal   Collection Time   10/06/11 11:54 AM      Component Value Range Comment   Glucose-Capillary 250 (*) 70 - 99 (mg/dL)     Radiology/Studies: Dg Chest [employer]  .09/28/2011 - Echo Study Conclusions  - Left ventricle: The cavity size was normal. Wall thickness   was increased in a pattern of mild LVH. Systolic function   was normal. The estimated ejection fraction was 55%. Wall   motion was normal; there were no  regional wall motion   abnormalities. - Aortic valve: There was no stenosis. - Mitral valve: Mildly to moderately calcified annulus. Mild   regurgitation. - Left atrium: The atrium was moderately dilated. - Right ventricle: The cavity size was mildly dilated.   Systolic function was mildly reduced. - Right atrium: The atrium was mildly dilated. - Tricuspid valve: Peak RV-RA gradient:16mm Hg (S). - Pulmonary  arteries: PA systolic pressure 54-58 mmHg. - Systemic veins: IVC measured 2.3 cm with < 50%   respirophasic variation, suggesting RA pressure 11-15   mmHg.   10/06/2011  *RADIOLOGY REPORT*  Clinical Data: Intubated patient.  Short of breath.  PORTABLE CHEST - 1 VIEW  Comparison: 10/05/2011.  Findings: Improved aeration of the right upper lobe with persistent airspace disease respecting the major fissure.  Cardiomegaly. 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 Monitoring leads are projected over the chest.  The left costophrenic angle is excluded from view.  IMPRESSION: Improving aeration with persistent right lung airspace disease, more focal in the right upper lobe.  Original Report Authenticated By: Andreas Newport, M.D.    EKG:  A.fib, no acute st/t changes.  ASSESSMENT AND PLAN:   1.  RLL PNA:  Abx per pulm.  2.  NSTEMI:  Likely type II in setting of above.  Pt denies c/p, sob.  ECG is non-acute.  Troponin is mildly elevated with relatively flat trend.  Pt has had a repeat echo today and we will eval to reassess LV function.  Provided that LV function is stable, and pt remains asymptomatic, would rec cont med rx with asa, bb, statin, and we can arrange for an outpt myoview at a later date (last myoview was in 2010).  3.  Acute Diast CHF:  Pt with evidence of volume overload and elevated bnp.  Agree with IV diuresis.  Re-assess LV function as above.  4.  Hyperkalemia:  Per Pulm.  5.  ETOH Abuse/DT's:  Ciwa protocol per pulm.  6.  Renal Insufficiency:  Follow.   Signed, Nicolasa Ducking, NP 10/06/2011, 2:06 PM   Patient seen and examined with Ward Givens, NP on 10/06/11. We discussed all aspects of the encounter. I agree with the assessment and plan as stated above.  Suspect small NSTEMI related to demand ischemia from PNA and respiratory failure. Tret with ASA. Not candidate for b-blocker. Agree with diuresis for volume overload. Will recheck echo to assure stability  of LV function. Can consider outpatient ischemic evaluation. Stressed need for ETOH cessation.  Dannell Gortney,MD 8:52 AM

## 2011-10-06 NOTE — Significant Event (Signed)
Pt with elevated BP and tachycardic.  He is scheduled for po metoprolol, but unable to take oral medications due to mental status.  Will change metoprolol to IV.  Lenin Kuhnle, MD 10/06/2011, 10:34 PM [phone]

## 2011-10-06 NOTE — Progress Notes (Signed)
Called placed to RT for initiation of BiPAP for the evening.

## 2011-10-06 NOTE — Progress Notes (Signed)
CRITICAL VALUE ALERT  Critical value received:  ckmb 9.1, trop 1.32  Date of notification:  10-06-11  Time of notification: 0110  Critical value read back:yes  Nurse who received alert:  Jamesetta So RN  MD notified (1st page):  Pola Corn

## 2011-10-06 NOTE — Progress Notes (Signed)
Name: [name] MRN: [id number] DOB: 1946/05/27    LOS: 1 PCCM ADMISSION NOTE  History of Present Illness: 65 yo WM with COPD, OSA and CAD brought to [facility] on 12/25 with altered mental status and respiratory difficulties.  Reports drinking four bottles of hard liquor the night before.  ED workup revealed RLL infiltrate, elevated cardiac enzymes/BNP and hypercarbic respiratory failure.  Heparin gtt was started and patient was placed on BiPAP. Transferred to [name] ICU for further management.   Lines / Drains: 12/25  Foley  Cultures: 12/25  UC 12/25  BC  Antibiotics: 12/25  Ceftriaxone 12/25  Moxifloxacin  Tests / Events: 12/25  Presnted to [facility] with AMS/SOB.  Subjective: Off BiPAP, O2 weaned to 3L/min Some mild agitation   Vital Signs:  T 97.9 P 94 R 19 BP 147/92 SpO2 94 (5L ) Temp:  [97.4 F (36.3 C)-98.1 F (36.7 C)] 98.1 F (36.7 C) (12/26 1225) Pulse Rate:  [72-113] 76  (12/26 1200) Resp:  [15-38] 19  (12/26 1200) BP: (113-147)/(55-102) 142/74 mmHg (12/26 1200) SpO2:  [86 %-98 %] 95 % (12/26 1200) Weight:  [131 kg (288 lb 12.8 oz)-131.2 kg (289 lb 3.9 oz)] 289 lb 3.9 oz (131.2 kg) (12/26 0500) I/O last 3 completed shifts: In: 750.8 [I.V.:246.8; Other:200; IV Piggyback:304] Out: 1190 [Urine:1190] Physical Examination: General:  Agitated, but not in acute distress Neuro:  Confused, nonfocal   HEENT:  PERRL Neck:  Supple, no JVD   Cardiovascular:  Irregular rhythm, controlled rate, no murmurs Lungs:  Bilateral diminished air entry, basal rales, few exp wheezes Abdomen:  Obese, lage panus, bowel sounds present Musculoskeletal:  Chronic stasis dermatitis / bilateral pitting edema lower extremities Skin:  Multiple abrasions, anasarca  Ventilator settings:   Labs and Imaging:  Reviewed.  Please refer to the Assessment and Plan section for relevant results.  Assessment and Plan:  Metabolic / alcoholic encephalopathy.  History of alcohol  abuse.  High risk of withdrawal. -->  Thiamine -->  Folate -->  CIWA q4h, add prn ativan 12/26 -->  Prozac (preadmission)  Acute exacerbation of COPD. -->  Bronchodilators -->  Flovent (preadmission) -->  Prednisone -->  Supplemental oxygen  CAP  versus aspiration pneumonia.  RLL infiltrate on outside CXR. -->  Avelox / Ceftriaxone, may widen coverage based on clinical course, labs, imaging -->  Cultures pending  Elevated cardiac enzymes (ACS vs. demand ischemia).  ECG>>>atrial fibrillation, controlled rate, no ST-T changes. Suspect stress NSTEMI due to PNA and resp status -->  Trend cardiac enzymes -->  Heparin gtt per pharmacy -->  ASA, Metoprolol, Zocor as preadmission -->  Will notify Dr Shawn Route that he is admitted  Acute on chronic systolic congestive heart failure.  Elevated BNP in ED.  Anasarca.   Lab 10/06/11 0530 10/05/11 2013  CREATININE 1.73* 1.85*  -->  Lasix as BP and renal fxn will permit -->  TTE performed 12/26, result pending  Atrial fibrillation with controlled rate.   -->  Heparin as above --> ? Start coumadin  Hyperkalemia  Lab 10/06/11 0530 10/05/11 2013  K 5.5* 5.4*  - recheck K this pm and in am  Acute on chronic hypercarbic respiratory failure on the background of OSA. ABG    Component Value Date/Time   PHART 7.387 10/06/2011 0613   PCO2ART 48.4* 10/06/2011 0613   PO2ART 83.0 10/06/2011 0613   HCO3 29.1* 10/06/2011 0613   TCO2 31 10/06/2011 0613   O2SAT 96.0 10/06/2011 0613  -->  ABG -->  BiPAP at night, needs outpt CPAP titration study  Best practices / Disposition -->ICU status under PCCM -->full code -->DVT Px is not indicated as on Heparin gtt for ACS -->GI Px is PPI (prior to admission) -->diet - start heart healthy   Levy Pupa, MD, PhD 10/06/2011, 12:49 PM [facility] [phone] or if no answer [phone]

## 2011-10-06 NOTE — Progress Notes (Signed)
*  PRELIMINARY RESULTS* Echocardiogram 2D Echocardiogram has been performed.  [name] [facility] 10/06/2011, 9:30 AM

## 2011-10-06 NOTE — Progress Notes (Signed)
ANTICOAGULATION CONSULT NOTE - Follow Up Consult  Pharmacy Consult for heparin Indication: chest pain/ACS  Not on File  Patient Measurements: Height: 5\' 10"  (177.8 cm) Weight: 289 lb 3.9 oz (131.2 kg) IBW/kg (Calculated) : 73  Adjusted Body Weight: 103.2  Vital Signs: Temp: 97.4 F (36.3 C) (12/26 0400) Temp src: Oral (12/26 0400) BP: 126/96 mmHg (12/26 0613) Pulse Rate: 85  (12/26 0613)  Labs:  Basename 10/06/11 0530 10/05/11 2013 10/05/11 0001  HGB 9.9* 10.4* --  HCT 33.6* 35.7* --  PLT 168 178 --  APTT -- -- --  LABPROT -- -- --  INR -- -- --  HEPARINUNFRC 0.12* -- --  CREATININE -- 1.85* --  CKTOTAL -- -- 281*  CKMB -- -- 9.1*  TROPONINI -- -- 1.32*   Estimated Creatinine Clearance: 52.8 ml/min (by C-G formula based on Cr of 1.85).   Medications:  Scheduled:    . ipratropium  0.5 mg Nebulization Q4H   And  . albuterol  2.5 mg Nebulization Q4H  . antiseptic oral rinse  15 mL Mouth Rinse q12n4p  . aspirin EC  325 mg Oral Daily  . cefTRIAXone (ROCEPHIN)  IV  2 g Intravenous Q24H  . chlorhexidine  15 mL Mouth/Throat BID  . FLUoxetine  20 mg Oral Daily  . fluticasone  1 puff Inhalation BID  . folic acid  1 mg Intravenous Daily  . furosemide      . furosemide  40 mg Intravenous BID  . insulin aspart  0-15 Units Subcutaneous Q4H  . levothyroxine  150 mcg Oral Q breakfast  . metoprolol  50 mg Oral BID  . moxifloxacin  400 mg Intravenous Q24H  . pantoprazole  40 mg Oral Q1200  . pneumococcal 13-valent conjugate vaccine  0.5 mL Intramuscular Tomorrow-1000  . predniSONE  40 mg Oral Q breakfast  . simvastatin  40 mg Oral QHS  . sodium chloride      . thiamine  100 mg Intravenous Daily  . DISCONTD: allopurinol  300 mg Oral Daily  . DISCONTD: aspirin  325 mg Oral Daily  . DISCONTD: predniSONE  40 mg Oral Q breakfast   Infusions:    . sodium chloride 10 mL/hr at 10/06/11 0001  . heparin 1,400 Units/hr (10/05/11 2145)  . DISCONTD: sodium chloride       Assessment: 65yo male subtherapeutic on heparin with initial dosing for ACS.  Goal of Therapy:  Heparin level 0.3-0.7 units/ml   Plan:  Will give heparin bolus of 3000 units and increase gtt by 4 units/kg/hr to 1800 units/hr, check level in 6hr.  Colleen Can PharmD BCPS 10/06/2011,6:25 AM

## 2011-10-06 NOTE — Progress Notes (Signed)
ANTICOAGULATION CONSULT NOTE - Follow Up Consult  Pharmacy Consult for Heparin Indication: chest pain/ACS  Allergies  Allergen Reactions  . Erythromycin     Took as a child for tooth ache and unable to recall reaction    Patient Measurements: Height: 5\' 10"  (177.8 cm) Weight: 289 lb 3.9 oz (131.2 kg) IBW/kg (Calculated) : 73    Vital Signs: Temp: 97.4 F (36.3 C) (12/26 2000) Temp src: Oral (12/26 2000) BP: 130/111 mmHg (12/26 2200) Pulse Rate: 99  (12/26 2200)  Labs:  Basename 10/06/11 2143 10/06/11 1349 10/06/11 0530 10/05/11 2013 10/05/11 0001  HGB -- -- 9.9* 10.4* --  HCT -- -- 33.6* 35.7* --  PLT -- -- 168 178 --  APTT -- -- -- -- --  LABPROT -- -- -- -- --  INR -- -- -- -- --  HEPARINUNFRC 0.45 0.22* 0.12* -- --  CREATININE -- -- 1.73* 1.85* --  CKTOTAL -- 224 253* -- 281*  CKMB -- 5.1* 7.6* -- 9.1*  TROPONINI -- 0.53* 1.18* -- 1.32*   Estimated Creatinine Clearance: 56.4 ml/min (by C-G formula based on Cr of 1.73).   Medications:  Infusions:    . sodium chloride 10 mL/hr at 10/06/11 1701  . heparin 2,200 Units/hr (10/06/11 1553)    Assessment: 65 yo M on heparin for NSTEMI.  Heparin level 0.45 on 2200 units/hr.  Goal of Therapy:  Heparin level 0.3-0.7 units/ml   Plan:  Continue Heparin at same rate. Follow up daily labs.  Hezikiah Retzloff, Elisha Headland, Pharm.D. 10/06/2011 10:41 PM

## 2011-10-06 NOTE — Progress Notes (Signed)
Inpatient Diabetes Program Recommendations  AACE/ADA: New Consensus Statement on Inpatient Glycemic Control (2009)  Target Ranges:  Prepandial:   less than 140 mg/dL      Peak postprandial:   less than 180 mg/dL (1-2 hours)      Critically ill patients:  140 - 180 mg/dL   CBGs today: 161/ 096/ 224/ 250 mg/dl  Inpatient Diabetes Program Recommendations Insulin - Basal: Please start Lantus 25 units QHS (0.2 units/kg dosing) Correction (SSI): Please change SSI coverage to tid ac + HS (currently ordered as Q4 hour coverage). HgbA1C: Please check A1C to assess pre-hospital glycemic control.  Note: While pt on PO steroids.

## 2011-10-07 ENCOUNTER — Inpatient Hospital Stay (HOSPITAL_COMMUNITY): Payer: Medicare Other

## 2011-10-07 DIAGNOSIS — I5032 Chronic diastolic (congestive) heart failure: Secondary | ICD-10-CM

## 2011-10-07 DIAGNOSIS — F10239 Alcohol dependence with withdrawal, unspecified: Secondary | ICD-10-CM

## 2011-10-07 DIAGNOSIS — I509 Heart failure, unspecified: Secondary | ICD-10-CM

## 2011-10-07 DIAGNOSIS — I4891 Unspecified atrial fibrillation: Secondary | ICD-10-CM

## 2011-10-07 LAB — BASIC METABOLIC PANEL
CO2: 31 mEq/L (ref 19–32)
Calcium: 9.4 mg/dL (ref 8.4–10.5)
Creatinine, Ser: 1.35 mg/dL (ref 0.50–1.35)
GFR calc Af Amer: 61 mL/min — ABNORMAL LOW (ref >90)

## 2011-10-07 LAB — URINE CULTURE
Colony Count: NO GROWTH
Culture  Setup Time: 201212252301

## 2011-10-07 LAB — GLUCOSE, CAPILLARY
Glucose-Capillary: 119 mg/dL — ABNORMAL HIGH (ref 70–99)
Glucose-Capillary: 128 mg/dL — ABNORMAL HIGH (ref 70–99)
Glucose-Capillary: 146 mg/dL — ABNORMAL HIGH (ref 70–99)
Glucose-Capillary: 159 mg/dL — ABNORMAL HIGH (ref 70–99)

## 2011-10-07 LAB — CBC
Hemoglobin: 9.7 g/dL — ABNORMAL LOW (ref 13.0–17.0)
MCH: 26.1 pg (ref 26.0–34.0)
MCHC: 29.5 g/dL — ABNORMAL LOW (ref 30.0–36.0)
MCV: 88.7 fL (ref 78.0–100.0)

## 2011-10-07 LAB — HEPARIN LEVEL (UNFRACTIONATED): Heparin Unfractionated: 0.41 IU/mL (ref 0.30–0.70)

## 2011-10-07 LAB — PRO B NATRIURETIC PEPTIDE: Pro B Natriuretic peptide (BNP): 14242 pg/mL — ABNORMAL HIGH (ref 0–125)

## 2011-10-07 LAB — MAGNESIUM: Magnesium: 2.3 mg/dL (ref 1.5–2.5)

## 2011-10-07 MED ORDER — MORPHINE SULFATE 2 MG/ML IJ SOLN
2.0000 mg | INTRAMUSCULAR | Status: DC | PRN
  Administered 2011-10-07 – 2011-10-10 (×5): 2 mg via INTRAVENOUS
  Filled 2011-10-07 (×5): qty 1

## 2011-10-07 MED ORDER — PREDNISONE 20 MG PO TABS
20.0000 mg | ORAL_TABLET | Freq: Every day | ORAL | Status: DC
  Administered 2011-10-08 – 2011-10-16 (×9): 20 mg via ORAL
  Filled 2011-10-07 (×11): qty 1

## 2011-10-07 MED ORDER — METOPROLOL TARTRATE 50 MG PO TABS
50.0000 mg | ORAL_TABLET | Freq: Two times a day (BID) | ORAL | Status: DC
  Administered 2011-10-07 – 2011-10-09 (×5): 50 mg via ORAL
  Filled 2011-10-07 (×6): qty 1

## 2011-10-07 MED ORDER — LORAZEPAM 2 MG/ML IJ SOLN
0.5000 mg | INTRAMUSCULAR | Status: DC | PRN
  Administered 2011-10-08: 1 mg via INTRAVENOUS
  Administered 2011-10-08: 0.5 mg via INTRAVENOUS
  Administered 2011-10-08 – 2011-10-09 (×2): 1 mg via INTRAVENOUS
  Filled 2011-10-07 (×5): qty 1

## 2011-10-07 MED ORDER — LORAZEPAM 2 MG/ML IJ SOLN: 0.5000 mg | Freq: Four times a day (QID) | INTRAMUSCULAR | Status: DC

## 2011-10-07 MED ORDER — ASPIRIN 325 MG PO TABS
325.0000 mg | ORAL_TABLET | Freq: Every day | ORAL | Status: DC
  Administered 2011-10-08 – 2011-10-16 (×9): 325 mg via ORAL
  Filled 2011-10-07 (×10): qty 1

## 2011-10-07 MED ORDER — SODIUM CHLORIDE 0.9 % IJ SOLN
INTRAMUSCULAR | Status: AC
  Administered 2011-10-07: 10 mL
  Filled 2011-10-07: qty 10

## 2011-10-07 MED ORDER — SPIRONOLACTONE 50 MG PO TABS
50.0000 mg | ORAL_TABLET | Freq: Every day | ORAL | Status: DC
  Administered 2011-10-08 – 2011-10-16 (×9): 50 mg via ORAL
  Filled 2011-10-07 (×10): qty 1

## 2011-10-07 NOTE — Progress Notes (Signed)
Name: [name] MRN: [id number] DOB: [birth year]    LOS: 2 PCCM FOLLOW UP NOTE  History of Present Illness: 65 yo WM with COPD, OSA and CAD brought to [facility] on 12/25 with altered mental status and respiratory difficulties.  Reports drinking four bottles of hard liquor the night before.  ED workup revealed RLL infiltrate, elevated cardiac enzymes/BNP and hypercarbic respiratory failure.  Heparin gtt was started and patient was placed on BiPAP. Transferred to [facility] ICU for further management.   Lines / Drains: 12/25  Foley  Cultures: 12/25  UC 12/25  BC  Antibiotics: 12/25  Ceftriaxone 12/25  Moxifloxacin  Tests / Events: 12/25  Presnted to [facility] with AMS/SOB.  Subjective: Has not required any more BiPAP for resp insufficiency Wore CPAP last night, but he can't remember it More lethargic today, received CIWA based ativan x 3 last 24 hours   Vital Signs:  T 97.9 P 94 R 19 BP 147/92 SpO2 94 (5L [place]) Temp:  [97.4 F (36.3 C)-98.3 F (36.8 C)] 97.8 F (36.6 C) (12/27 0850) Pulse Rate:  [76-108] 100  (12/27 0900) Resp:  [19-30] 22  (12/27 1100) BP: (81-163)/(60-111) 152/98 mmHg (12/27 1100) SpO2:  [92 %-99 %] 93 % (12/27 0900) Weight:  [130.9 kg (288 lb 9.3 oz)] 288 lb 9.3 oz (130.9 kg) (12/27 0500) I/O last 3 completed shifts: In: 1941.8 [P.O.:180; I.V.:949.8; Other:200; IV Piggyback:612] Out: 4565 [Urine:4565] Physical Examination: General:  Agitated, but not in acute distress Neuro:  Confused, nonfocal   HEENT:  PERRL Neck:  Supple, no JVD   Cardiovascular:  Irregular rhythm, controlled rate, no murmurs Lungs:  Bilateral diminished air entry, basal rales, few exp wheezes Abdomen:  Obese, lage panus, bowel sounds present Musculoskeletal:  Chronic stasis dermatitis / bilateral pitting edema lower extremities Skin:  Multiple abrasions, anasarca  Ventilator settings:   Labs and Imaging:  Reviewed.  Please refer to the Assessment and Plan section  for relevant results.  Assessment and Plan:  Metabolic / alcoholic encephalopathy.  History of alcohol abuse.  High risk of withdrawal. -->  Thiamine -->  Folate -->  Change CIWA q6h, decrease prn ativan 12/27 as he is more sedated today -->  Prozac (preadmission)  Acute exacerbation of COPD. -->  Bronchodilators -->  Flovent (preadmission) -->  Prednisone -->  Supplemental oxygen  CAP  versus aspiration pneumonia.  RLL infiltrate on outside CXR. -->  Avelox / Ceftriaxone -->  Cultures pending -->  follow CXR  Elevated cardiac enzymes (ACS vs. demand ischemia).  ECG>>>atrial fibrillation, controlled rate, no ST-T changes. Stress NSTEMI due to PNA and resp status -->  Heparin gtt until back on coumadin -->  ASA, Metoprolol, Zocor as preadmission -->  Appreciate Dr Sinclair Grooms help - possible perfusion stress testing as an outpt  Acute on chronic systolic congestive heart failure.  Elevated BNP in ED.  Anasarca.   Lab 10/07/11 0407 10/06/11 0530 10/05/11 2013  CREATININE 1.35 1.73* 1.85*  -->  Lasix as BP and renal fxn will permit -->  TTE repeated 12/26, stable  Atrial fibrillation with controlled rate.   -->  Heparin as above -->  Start coumadin  Hyperkalemia  Lab 10/07/11 0407 10/06/11 0530 10/05/11 2013  K 4.3 5.5* 5.4*  - recheck K am  Acute on chronic hypercarbic respiratory failure on the background of OSA. ABG    Component Value Date/Time   PHART 7.387 10/06/2011 0613   PCO2ART 48.4* 10/06/2011 0613   PO2ART 83.0 10/06/2011 7829  HCO3 29.1* 10/06/2011 0613   TCO2 31 10/06/2011 0613   O2SAT 96.0 10/06/2011 0613  -->  BiPAP at night, needs outpt CPAP titration study  Best practices / Disposition -->ICU status under PCCM, consider tx to SDU if looks good this pm -->full code -->DVT Px is not indicated as on Heparin gtt -->GI Px is PPI (prior to admission) -->diet - start heart healthy   Levy Pupa, MD, PhD 10/07/2011, 11:22 AM [facility] [phone] or if no answer [phone]

## 2011-10-07 NOTE — Significant Event (Signed)
Pt c/o leg pains at stasis ulcer sites.  Will give prn morphine.  Charlee Whitebread, MD 10/07/2011, 8:35 PM [phone]

## 2011-10-07 NOTE — Progress Notes (Addendum)
ANTICOAGULATION CONSULT NOTE - Follow Up Consult  Pharmacy Consult for Heparin Indication: chest pain/ACS  Allergies  Allergen Reactions  . Erythromycin     Took as a child for tooth ache and unable to recall reaction   Admit Complaint: Pharmacist System-Based Medication Review: Anticoagulation: NSTEMI, Heparin level = 0.41 this AM. Plan to medrx for MI. No complications with anticoag. Infectious Disease: CAP v asp pneumonia, emipric Ceftriaxone and moxifloxacin D#3. All cultures are neg to date. Pt remains afebrile and WBC is back down to normal.  Cardiovascular: NSTEMI, HF, Afib, ASA, hepain, metoprolol, simva, EF 45-50%. MI likely as the result of PNA Fluid/Electrolytes / Nutrition: N/a Nephrology: Scr down to 1.35. BUN still elevated. Dehydration? Endocrine: hypothyroidism, levothyroxine 150 mcg daily Neurology: Metabolic encephalopathy, alcohol abuse, CIWA, prozac, thiamine, folate Pulmonary: COPD, prednisone 40 mg daily PTA Medication Issues Allopurinol, spironolactone 20 mg daily Best Practices: IV heparin, protonix, mouth care protocol  Assessment: 65 yo who was admitted with NSTEMI due to PNA. He was started on IV heparin and has remained on it since admission. Heparin level has been therapeutic. Cards plan to manage medically only.  Plan: 1. Cont IV heparin at 2200 units/hr 2. F/u with level in AM  Medications:  Prescriptions prior to admission  Medication Sig Dispense Refill  . allopurinol (ZYLOPRIM) 300 MG tablet Take 300 mg by mouth daily.        Marland Kitchen aspirin 325 MG tablet Take 325 mg by mouth daily.        Marland Kitchen FLUoxetine (PROZAC) 20 MG capsule Take 20 mg by mouth daily.        . fluticasone (FLOVENT HFA) 220 MCG/ACT inhaler Inhale 1 puff into the lungs 2 (two) times daily.        Marland Kitchen levothyroxine (SYNTHROID, LEVOTHROID) 150 MCG tablet Take 150 mcg by mouth daily.        . magnesium oxide (MAG-OX) 400 MG tablet Take 400 mg by mouth daily.        . metoprolol (LOPRESSOR)  50 MG tablet Take 50 mg by mouth 2 (two) times daily.        Marland Kitchen omeprazole (PRILOSEC) 20 MG capsule Take 20 mg by mouth daily.        . potassium chloride SA (K-DUR,KLOR-CON) 20 MEQ tablet Take 20 mEq by mouth daily.        . simvastatin (ZOCOR) 40 MG tablet Take 40 mg by mouth at bedtime.        Marland Kitchen spironolactone (ALDACTONE) 50 MG tablet Take 50 mg by mouth daily.        Marland Kitchen DISCONTD: albuterol (ACCUNEB) 0.63 MG/3ML nebulizer solution Take 1 ampule by nebulization daily as needed.        Marland Kitchen DISCONTD: Fish Oil-Cholecalciferol (FISH OIL + D3) 1200-1000 MG-UNIT CAPS Take by mouth. 2 tabs daily       . DISCONTD: furosemide (LASIX) 80 MG tablet 4 tabs in the am      . DISCONTD: glipiZIDE (GLUCOTROL) 10 MG tablet Take 10 mg by mouth 2 (two) times daily before a meal.        . DISCONTD: Ipratropium-Albuterol (COMBIVENT IN) Inhale into the lungs. As directed       . DISCONTD: Multiple Vitamin (MULTIVITAMIN) capsule Take 1 capsule by mouth daily.         Assessment: 65 yo M on heparin for NSTEMI, level reported less than goal of 0.3-0.7, will give 3000 units IV x 1 then increase infusion to 2200 units/hr  Goal of Therapy:  Heparin level 0.3-0.7 units/ml   Plan:  1. Heparin 3000 units IV x 1 then 2. Heparin 2200 units/hr 3. Check a heparin level 6h after rate change   Patient Measurements: Height: 5\' 10"  (177.8 cm) Weight: 288 lb 9.3 oz (130.9 kg) IBW/kg (Calculated) : 73  Heparin dosing Weight: 103 kg  Vital Signs: Temp: 97.8 F (36.6 C) (12/27 0850) Temp src: Oral (12/27 0850) BP: 157/75 mmHg (12/27 0900) Pulse Rate: 100  (12/27 0900)  Labs:  Basename 10/07/11 0407 10/06/11 2143 10/06/11 1349 10/06/11 0530 10/05/11 2013 10/05/11 0001  HGB 9.7* -- -- 9.9* -- --  HCT 32.9* -- -- 33.6* 35.7* --  PLT 165 -- -- 168 178 --  APTT -- -- -- -- -- --  LABPROT -- -- -- -- -- --  INR -- -- -- -- -- --  HEPARINUNFRC 0.41 0.45 0.22* -- -- --  CREATININE 1.35 -- -- 1.73* 1.85* --  CKTOTAL -- --  224 253* -- 281*  CKMB -- -- 5.1* 7.6* -- 9.1*  TROPONINI -- -- 0.53* 1.18* -- 1.32*   Estimated Creatinine Clearance: 72.2 ml/min (by C-G formula based on Cr of 1.35).  Ulyses Southward [place] 10/07/2011,9:15 AM   Coumadin ordered but pt is not a coumadin candidate per Cards. D/w Dr. Delton Coombes. Will dc coumadin.

## 2011-10-07 NOTE — Progress Notes (Signed)
SUBJECTIVE: Pt is sleeping. No events overnight.   BP 141/87  Pulse 100  Temp(Src) 97.8 F (36.6 C) (Oral)  Resp 20  Ht 5\' 10"  (1.778 m)  Wt 288 lb 9.3 oz (130.9 kg)  BMI 41.41 kg/m2  SpO2 93%  Intake/Output Summary (Last 24 hours) at 10/07/11 1039 Last data filed at 10/07/11 1000  Gross per 24 hour  Intake   1139 ml  Output   4235 ml  Net  -3096 ml    PHYSICAL EXAM General: Well developed, well nourished, in no acute distress. Pt is sleeping this am.  Neck: Unable to assess JVD  Lungs: Clear anterior lung fields bilaterally with slight rhonci in bilateral bases. No wheezes.  Heart: Irregular  with no murmurs noted. Abdomen: Bowel sounds are present. Soft, non-tender.  Extremities: 2+ bilateral lower ext edema. Chronic venous stasis changes.   LABS: Basic Metabolic Panel:  Basename 10/07/11 0407 10/06/11 0530  NA 139 136  K 4.3 5.5*  CL 99 97  CO2 31 29  GLUCOSE 121* 207*  BUN 71* 68*  CREATININE 1.35 1.73*  CALCIUM 9.4 9.2  MG 2.3 2.1  PHOS 3.9 4.8*   CBC:  Basename 10/07/11 0407 10/06/11 0530 10/05/11 2013  WBC 10.2 12.7* --  NEUTROABS -- -- 20.0*  HGB 9.7* 9.9* --  HCT 32.9* 33.6* --  MCV 88.7 88.4 --  PLT 165 168 --   Cardiac Enzymes:  Basename 10/06/11 1349 10/06/11 0530 10/05/11 0001  CKTOTAL 224 253* 281*  CKMB 5.1* 7.6* 9.1*  CKMBINDEX -- -- --  TROPONINI 0.53* 1.18* 1.32*    Current Meds:    . ipratropium  0.5 mg Nebulization Q4H   And  . albuterol  2.5 mg Nebulization Q4H  . antiseptic oral rinse  15 mL Mouth Rinse q12n4p  . aspirin EC  325 mg Oral Daily  . cefTRIAXone (ROCEPHIN)  IV  2 g Intravenous Q24H  . chlorhexidine  15 mL Mouth/Throat BID  . FLUoxetine  20 mg Oral Daily  . fluticasone  1 puff Inhalation BID  . folic acid  1 mg Intravenous Daily  . furosemide  40 mg Intravenous BID  . heparin  3,000 Units Intravenous Once  . insulin aspart  0-15 Units Subcutaneous Q4H  . levothyroxine  150 mcg Oral Q breakfast  . LORazepam       . metoprolol  5 mg Intravenous Q6H  . moxifloxacin  400 mg Intravenous Q24H  . pantoprazole  40 mg Oral Q1200  . pneumococcal 13-valent conjugate vaccine  0.5 mL Intramuscular Tomorrow-1000  . predniSONE  40 mg Oral Q breakfast  . simvastatin  40 mg Oral QHS  . sodium chloride      . sodium chloride      . sodium chloride      . thiamine  100 mg Intravenous Daily  . DISCONTD: metoprolol  50 mg Oral BID     ASSESSMENT AND PLAN:  1. RLL PNA: Antibiotics per pulmonary.   2. NSTEMI: Likely type II in setting of above. Pt denies chest pain.  ECG without ischemic changes. Troponin is mildly elevated and trending down, likely related to demand ischemia in pt with known CAD with previous bypass surgery. Would recommend continued medical management with ASA,  beta blocker, statin. We will arrange an outpatient stress myoview at a later date (last myoview was in 2010).   3. Acute Diast CHF: Pt with evidence of volume overload and elevated bnp. Agree with continued IV diuresis.  4. ETOH Abuse/DT's: Ciwa protocol per pulm.   5. Renal Insufficiency:  Renal function is improving.   6. Atrial fibrillation: Chronic. Rate is controlled. Titrate beta blocker as needed for rate control. He is not a coumadin candidate given his history of etoh abuse in the past as documented in the outpatient medical record.      [name],[name]  12/27/201210:39 AM

## 2011-10-08 ENCOUNTER — Inpatient Hospital Stay (HOSPITAL_COMMUNITY): Payer: Medicare Other

## 2011-10-08 LAB — BASIC METABOLIC PANEL
BUN: 56 mg/dL — ABNORMAL HIGH (ref 6–23)
Calcium: 9.7 mg/dL (ref 8.4–10.5)
Chloride: 98 mEq/L (ref 96–112)
Creatinine, Ser: 1.01 mg/dL (ref 0.50–1.35)
GFR calc Af Amer: 87 mL/min — ABNORMAL LOW (ref >90)
GFR calc non Af Amer: 75 mL/min — ABNORMAL LOW (ref >90)

## 2011-10-08 LAB — HEPARIN LEVEL (UNFRACTIONATED): Heparin Unfractionated: 0.49 IU/mL (ref 0.30–0.70)

## 2011-10-08 LAB — CBC
MCH: 26 pg (ref 26.0–34.0)
MCHC: 29.8 g/dL — ABNORMAL LOW (ref 30.0–36.0)
MCV: 87.1 fL (ref 78.0–100.0)
Platelets: 178 10*3/uL (ref 150–400)
RBC: 3.89 MIL/uL — ABNORMAL LOW (ref 4.22–5.81)

## 2011-10-08 LAB — GLUCOSE, CAPILLARY
Glucose-Capillary: 138 mg/dL — ABNORMAL HIGH (ref 70–99)
Glucose-Capillary: 203 mg/dL — ABNORMAL HIGH (ref 70–99)

## 2011-10-08 MED ORDER — HEPARIN SODIUM (PORCINE) 5000 UNIT/ML IJ SOLN
5000.0000 [IU] | Freq: Three times a day (TID) | INTRAMUSCULAR | Status: DC
  Administered 2011-10-08 – 2011-10-16 (×23): 5000 [IU] via SUBCUTANEOUS
  Filled 2011-10-08 (×27): qty 1

## 2011-10-08 MED ORDER — ALBUTEROL SULFATE (5 MG/ML) 0.5% IN NEBU
2.5000 mg | INHALATION_SOLUTION | RESPIRATORY_TRACT | Status: DC | PRN
  Filled 2011-10-08: qty 0.5

## 2011-10-08 MED ORDER — ALBUTEROL SULFATE (5 MG/ML) 0.5% IN NEBU
2.5000 mg | INHALATION_SOLUTION | Freq: Four times a day (QID) | RESPIRATORY_TRACT | Status: DC
  Administered 2011-10-08 – 2011-10-16 (×32): 2.5 mg via RESPIRATORY_TRACT
  Filled 2011-10-08 (×33): qty 0.5

## 2011-10-08 MED ORDER — SODIUM CHLORIDE 0.9 % IJ SOLN
INTRAMUSCULAR | Status: AC
  Administered 2011-10-08: 01:00:00
  Filled 2011-10-08: qty 10

## 2011-10-08 MED ORDER — IPRATROPIUM BROMIDE 0.02 % IN SOLN
0.5000 mg | Freq: Four times a day (QID) | RESPIRATORY_TRACT | Status: DC
  Administered 2011-10-08 – 2011-10-16 (×32): 0.5 mg via RESPIRATORY_TRACT
  Filled 2011-10-08 (×34): qty 2.5

## 2011-10-08 MED ORDER — MOXIFLOXACIN HCL 400 MG PO TABS
400.0000 mg | ORAL_TABLET | Freq: Every day | ORAL | Status: AC
  Administered 2011-10-08 – 2011-10-14 (×7): 400 mg via ORAL
  Filled 2011-10-08 (×7): qty 1

## 2011-10-08 MED ORDER — FOLIC ACID 1 MG PO TABS
1.0000 mg | ORAL_TABLET | Freq: Every day | ORAL | Status: DC
  Administered 2011-10-09 – 2011-10-16 (×8): 1 mg via ORAL
  Filled 2011-10-08 (×8): qty 1

## 2011-10-08 MED ORDER — POTASSIUM CHLORIDE CRYS ER 20 MEQ PO TBCR
EXTENDED_RELEASE_TABLET | ORAL | Status: AC
  Filled 2011-10-08: qty 2

## 2011-10-08 NOTE — Progress Notes (Signed)
Patient Name: [name] Date of Encounter: 10/08/2011  Active Problems:  DM  HYPERLIPIDEMIA  HYPERTENSION  CHRONIC OBSTRUCTIVE PULMONARY DISEASE  CAD  ETOH abuse  Atrial fibrillation  Renal insufficiency  NSTEMI (non-ST elevated myocardial infarction)    SUBJECTIVE: Pt is confused and tries to get out of bed, wants to get up, leave, etc. After about 10 min, realized he was in the hospital. Denies CP/SOB/dizziness. Sitter in room.   OBJECTIVE  Filed Vitals:   10/08/11 0355 10/08/11 0400 10/08/11 0500 10/08/11 0600  BP: 143/85 159/95 136/74 117/81  Pulse: 95 97 79 80  Temp:  97.8 F (36.6 C)    TempSrc:  Oral    Resp: 25 22 19 24   Height:      Weight:  284 lb 6.3 oz (129 kg) 284 lb 6.3 oz (129 kg)   SpO2: 99% 98% 95% 98%    Intake/Output Summary (Last 24 hours) at 10/08/11 1610 Last data filed at 10/08/11 0600  Gross per 24 hour  Intake   1032 ml  Output   4220 ml  Net  -3188 ml   Weight change: -4 lb 3 oz (-1.9 kg)  PHYSICAL EXAM  General: Well developed, obese white male, in no acute distress. Head: Normocephalic, atraumatic, nares are without discharge.  Neck: Supple without bruits, approx 8cm JVD. Lungs:  Resp regular and unlabored, rales bilateral bases but good air exchange, no crackles or wheeze noted. Heart: irreg rate and rhythm, no s3, s4, 2/6 murmur Abdomen: Soft, non-tender, non-distended, BS decreased but present.  Msk:  Strength and tone appears normal for age. Extremities: No clubbing, cyanosis, + edema and erythema Neuro: Alert and oriented X 1. Moves all extremities spontaneously, responds to commands.  LABS:  CBC: Basename 10/08/11 0430 10/07/11 0407 10/05/11 2013  WBC 9.9 10.2 --  NEUTROABS -- -- 20.0*  HGB 10.1* 9.7* --  HCT 33.9* 32.9* --  MCV 87.1 88.7 --  PLT 178 165 --   Basic Metabolic Panel: Basename 10/08/11 0430 10/07/11 0407  NA 138 139  K 4.2 4.3  CL 98 99  CO2 30 31  GLUCOSE 128* 121*  BUN 56* 71*    CREATININE 1.01 1.35  CALCIUM 9.7 9.4  MG 2.0 2.3  PHOS 2.6 3.9   Liver Function Tests: [facility] 10/05/11 2013  AST 141*  ALT 109*  ALKPHOS 88  BILITOT 0.2*  PROT 7.4  ALBUMIN 3.0*   Cardiac Enzymes Basename 10/06/11 1349 10/06/11 0530  CKTOTAL 224 253*  CKMB 5.1* 7.6*  CKMBINDEX -- --  TROPONINI 0.53* 1.18*    TELE: atrial fib, rate is controlled, occ. PVCs.  Echo: Study Conclusions  - Left ventricle: The cavity size was normal. Wall thickness was normal. Systolic function was mildly reduced. The estimated ejection fraction was in the range of 45% to 50%. - Left atrium: The atrium was moderately dilated. - Right ventricle: The cavity size was moderately dilated. - Right atrium: The atrium was moderately dilated. - Tricuspid valve: Moderate regurgitation. - Pulmonary arteries: Systolic pressure was moderately to severely increased. PA peak pressure: 66mm Hg (S).     Radiology/Studies:   Dg Chest Port 1 View 10/07/2011  *RADIOLOGY REPORT*  Clinical Data: Endotracheal tube, shortness of breath.  PORTABLE CHEST - 1 VIEW  Comparison: 10/06/2011.  Findings: Trachea is midline.  Heart size stable.  Lungs are somewhat low in volume with diffuse bilateral air space disease. Focality in the right perihilar region has dissipated somewhat.  No definite  pleural fluid.  IMPRESSION: Diffuse bilateral air space disease with slight improvement in the right perihilar consolidation.  Original Report Authenticated By: Reyes Ivan, M.D.    Current Medications:     . ipratropium  0.5 mg Nebulization Q4H   And  . albuterol  2.5 mg Nebulization Q4H  . antiseptic oral rinse  15 mL Mouth Rinse q12n4p  . aspirin  325 mg Oral Daily  . cefTRIAXone (ROCEPHIN)  IV  2 g Intravenous Q24H  . chlorhexidine  15 mL Mouth/Throat BID  . FLUoxetine  20 mg Oral Daily  . fluticasone  1 puff Inhalation BID  . folic acid  1 mg Intravenous Daily  . furosemide  40 mg Intravenous BID  . insulin  aspart  0-15 Units Subcutaneous Q4H  . levothyroxine  150 mcg Oral Q breakfast  . metoprolol  50 mg Oral BID  . moxifloxacin  400 mg Intravenous Q24H  . pantoprazole  40 mg Oral Q1200  . pneumococcal 13-valent conjugate vaccine  0.5 mL Intramuscular Tomorrow-1000  . predniSONE  20 mg Oral Q breakfast  . simvastatin  40 mg Oral QHS  . sodium chloride      . spironolactone  50 mg Oral Daily    ASSESSMENT AND PLAN:  1. Atrial fib: rate controlled on current Rx  2. Acute Diast CHF: continue IV lasix 24-48 hours more, cont to follow BMET  3. ARI : improving  3. Anticoag: Currently on heparin, but ASA 325 only as an outpatient is appropriate as he has Hx ETOH abuse.   4. NSTEMI: Hx CABG, He is on ASA/BB/statin, felt Type 2 secondary to acute illness, Myoview as OP after he recovers  5. LVD : EF 45-50% on echo, he is on diuretics and BB, but no ACE/ARB 2nd ARI at this time.  6.  PNA, DM and other issues, per CCM/pulm.     Signed, Theodore Demark , PA-C  I have personally seen and examined this patient with Theodore Demark, PA-C.  I agree with the assessment and plan as outlined above. His atrial fibrillation is rate controlled. I agree with the plan above for current meds. He is not a good long term coumadin candidate given history of etoh abuse. Continue diuresis for volume overload. Follow creatinine with diuresis. No cath indicated at this time. Elevated troponin likely secondary to demand ischemia.   MCALHANY,CHRISTOPHER 10:49 AM 10/08/2011

## 2011-10-08 NOTE — Progress Notes (Signed)
Inpatient Diabetes Program Recommendations  AACE/ADA: New Consensus Statement on Inpatient Glycemic Control (2009)  Target Ranges:  Prepandial:   less than 140 mg/dL      Peak postprandial:   less than 180 mg/dL (1-2 hours)      Critically ill patients:  140 - 180 mg/dL   CBGs 41/32: 440/ 102/ 128/ 159/ 270/ 283 mg/dl  Inpatient Diabetes Program Recommendations Insulin - Basal: x Correction (SSI): Please change SSI coverage to tid ac + HS (currently ordered as Q4 hour coverage). Insulin - Meal Coverage: Please add Novolog 3 units tid with meals- Postprandial CBGs elevated likely due to PO Prednisone. HgbA1C: Please check A1C to assess pre-hospital glycemic control.  Note:

## 2011-10-08 NOTE — Progress Notes (Signed)
ANTICOAGULATION CONSULT NOTE - Follow Up Consult  Pharmacy Consult for heparin Indication: afib  Allergies  Allergen Reactions  . Erythromycin     Took as a child for tooth ache and unable to recall reaction   Vital Signs: Temp: 98.2 F (36.8 C) (12/28 0843) Temp src: Oral (12/28 0843) BP: 155/83 mmHg (12/28 0900) Pulse Rate: 90  (12/28 0900)  Labs:  Basename 10/08/11 0430 10/07/11 0407 10/06/11 2143 10/06/11 1349 10/06/11 0530  HGB 10.1* 9.7* -- -- --  HCT 33.9* 32.9* -- -- 33.6*  PLT 178 165 -- -- 168  APTT -- -- -- -- --  LABPROT -- -- -- -- --  INR -- -- -- -- --  HEPARINUNFRC 0.49 0.41 0.45 -- --  CREATININE 1.01 1.35 -- -- 1.73*  CKTOTAL -- -- -- 224 253*  CKMB -- -- -- 5.1* 7.6*  TROPONINI -- -- -- 0.53* 1.18*   Estimated Creatinine Clearance: 95.8 ml/min (by C-G formula based on Cr of 1.01).  Assessment: Pharmacist System-Based Medication Review: Anticoagulation: NSTEMI, Heparin level = 0.49 this AM. Plan to medrx for MI. No complications with anticoag. Pt is not a coumadin candidate due to his ETOH abuse and falls. Use ASA only Infectious Disease: CAP v asp pneumonia, emipric Ceftriaxone and moxifloxacin D#4. All cultures are neg to date. Pt remains afebrile and WBC is back down to normal.  Cardiovascular: NSTEMI, HF, Afib, ASA, hepain, metoprolol, simva, EF 45-50%. MI likely as the result of PNA. Plan to cont IV lasix for next 23-48 hr.  Fluid/Electrolytes / Nutrition: N/a Nephrology: Scr down to 1.01. BUN now also trending down  Endocrine: hypothyroidism, levothyroxine 150 mcg daily Neurology: Metabolic encephalopathy, alcohol abuse, CIWA, prozac, thiamine, folate. Confused this AM. Sitter in room Pulmonary: COPD, tapering to prednisone 20 mg daily PTA Medication Issues Allopurinol, Best Practices: IV heparin, protonix, mouth care protocol  Assessment: 65 yo who was admitted with NSTEMI due to PNA. He was started on IV heparin and has remained on it since  admission. Heparin level has been therapeutic. Cards plan to manage medically only.  Plan: 1. Cont IV heparin at 2200 units/hr 2. F/u with level in AM  [name] [place], [name] [place] 10/08/2011,9:56 AM

## 2011-10-08 NOTE — Progress Notes (Signed)
Name: JAKORI BURKETT MRN: [id number] DOB: 10/31/1945    LOS: 3 PCCM FOLLOW UP NOTE  History of Present Illness: 65 yo WM with COPD, OSA and CAD brought to [facility] on 12/25 with altered mental status and respiratory difficulties.  Reports drinking four bottles of hard liquor the night before.  ED workup revealed RLL infiltrate, elevated cardiac enzymes/BNP and hypercarbic respiratory failure.  Heparin gtt was started and patient was placed on BiPAP. Transferred to [facility] ICU for further management.   Lines / Drains: 12/25  Foley  Cultures: 12/25  UC >> negative 12/25  BC  Antibiotics:  12/25  Ceftriaxone >> 12/28 12/25  Moxifloxacin  Tests / Events: 12/25  Presented to [facility] with AMS/SOB.  Subjective: Has not required any more BiPAP for resp insufficiency Wore CPAP part of the night Received ativan x 1 last night and again this am for some agitation and trying to get out of bed.    Vital Signs:  T 97.9 P 94 R 19 BP 147/92 SpO2 94 (5L [place]) Temp:  [97.8 F (36.6 C)-98.5 F (36.9 C)] 98.2 F (36.8 C) (12/28 0843) Pulse Rate:  [58-145] 90  (12/28 1001) Resp:  [19-30] 30  (12/28 1000) BP: (117-173)/(65-102) 137/68 mmHg (12/28 1001) SpO2:  [85 %-100 %] 97 % (12/28 1000) Weight:  [129 kg (284 lb 6.3 oz)] 284 lb 6.3 oz (129 kg) (12/28 0500) I/O last 3 completed shifts: In: 1802 [P.O.:70; I.V.:1132; IV Piggyback:600] Out: 5570 [Urine:5570] Physical Examination: General:  Agitated, but not in acute distress Neuro:  Confused, nonfocal   HEENT:  PERRL Neck:  Supple, no JVD   Cardiovascular:  Irregular rhythm, controlled rate, no murmurs Lungs:  Bilateral diminished air entry, basal rales, few exp wheezes Abdomen:  Obese, lage panus, bowel sounds present Musculoskeletal:  Chronic stasis dermatitis / bilateral pitting edema lower extremities Skin:  Multiple abrasions, anasarca   Labs and Imaging:  Reviewed.  Please refer to the Assessment and Plan section for  relevant results. Dg Chest Port 1 View  10/08/2011  *RADIOLOGY REPORT*  Clinical Data: Pneumonia  PORTABLE CHEST - 1 VIEW  Comparison: Yesterday  Findings: Low volumes.  Diffuse edema.  No pneumothorax.  Mild cardiomegaly.  IMPRESSION: Stable edema.  Original Report Authenticated By: Donavan Burnet, M.D.   Dg Chest Port 1 View  10/07/2011  *RADIOLOGY REPORT*  Clinical Data: Endotracheal tube, shortness of breath.  PORTABLE CHEST - 1 VIEW  Comparison: 10/06/2011.  Findings: Trachea is midline.  Heart size stable.  Lungs are somewhat low in volume with diffuse bilateral air space disease. Focality in the right perihilar region has dissipated somewhat.  No definite pleural fluid.  IMPRESSION: Diffuse bilateral air space disease with slight improvement in the right perihilar consolidation.  Original Report Authenticated By: Reyes Ivan, M.D.     Assessment and Plan:  Metabolic / alcoholic encephalopathy.  History of alcohol abuse.  High risk of withdrawal. -->  Thiamine -->  Folate -->  CIWA q6h, prn ativan  -->  Prozac (preadmission)  Acute exacerbation of COPD. -->  Bronchodilators -->  Flovent (preadmission) -->  Prednisone, start tapering -->  Supplemental oxygen  CAP  versus aspiration pneumonia.  RLL infiltrate initial CXR, now with more edema pattern 12/27 -28 -->  Avelox / Ceftriaxone on admission, narrow to PO avelox alone on 12/28 -->  Cultures pending -->  follow CXR and restart lasix now that renal fxn stable 12/28  Elevated cardiac enzymes (ACS vs. demand ischemia).  ECG>>>atrial fibrillation, controlled rate, no ST-T changes. Stress NSTEMI due to PNA and resp status -->  D/c Heparin gtt -->  ASA, Metoprolol, Zocor as preadmission -->  Appreciate Dr Sinclair Grooms help - possible perfusion stress testing as an outpt  Acute on chronic systolic congestive heart failure.  Elevated BNP in ED.  Anasarca.   Lab 10/08/11 0430 10/07/11 0407 10/06/11 0530 10/05/11 2013    CREATININE 1.01 1.35 1.73* 1.85*  -->  Lasix as BP and renal fxn will permit -->  TTE repeated 12/26, stable  Atrial fibrillation with controlled rate.   -->  D/c Heparin as above -->  Not a candidate for coumadin per cardiology past notes (fall risk)  Hyperkalemia  Lab 10/08/11 0430 10/07/11 0407 10/06/11 0530 10/05/11 2013  K 4.2 4.3 5.5* 5.4*  - recheck K am  Acute on chronic hypercarbic respiratory failure on the background of OSA. ABG    Component Value Date/Time   PHART 7.387 10/06/2011 0613   PCO2ART 48.4* 10/06/2011 0613   PO2ART 83.0 10/06/2011 0613   HCO3 29.1* 10/06/2011 0613   TCO2 31 10/06/2011 0613   O2SAT 96.0 10/06/2011 0613  -->  BiPAP at night, needs outpt CPAP titration study  Best practices / Disposition -->status SDU -->full code -->DVT Px heparin subcut -->GI Px is PPI (prior to admission) -->diet - heart healthy   Levy Pupa, MD, PhD 10/08/2011, 10:57 AM [place] [phone] or if no answer [phone]

## 2011-10-09 DIAGNOSIS — I501 Left ventricular failure: Secondary | ICD-10-CM

## 2011-10-09 DIAGNOSIS — J9600 Acute respiratory failure, unspecified whether with hypoxia or hypercapnia: Secondary | ICD-10-CM

## 2011-10-09 DIAGNOSIS — I214 Non-ST elevation (NSTEMI) myocardial infarction: Secondary | ICD-10-CM

## 2011-10-09 DIAGNOSIS — J158 Pneumonia due to other specified bacteria: Secondary | ICD-10-CM

## 2011-10-09 LAB — GLUCOSE, CAPILLARY
Glucose-Capillary: 138 mg/dL — ABNORMAL HIGH (ref 70–99)
Glucose-Capillary: 139 mg/dL — ABNORMAL HIGH (ref 70–99)
Glucose-Capillary: 178 mg/dL — ABNORMAL HIGH (ref 70–99)

## 2011-10-09 MED ORDER — METOPROLOL TARTRATE 50 MG PO TABS
75.0000 mg | ORAL_TABLET | Freq: Two times a day (BID) | ORAL | Status: DC
  Administered 2011-10-10 – 2011-10-16 (×14): 75 mg via ORAL
  Filled 2011-10-09 (×15): qty 1

## 2011-10-09 NOTE — Progress Notes (Signed)
eLink Physician-Brief Progress Note Patient Name: [name] DOB: 1946/09/20 MRN: [id number]  Date of Service  10/09/2011   HPI/Events of Note   Desatn to 60% during sleep  eICU Interventions  CPAP during sleep    Intervention Category Intermediate Interventions: Other:  [name] Bailon V. 10/09/2011, 8:38 PM

## 2011-10-09 NOTE — Progress Notes (Signed)
Name: [name] MRN: [id number] DOB: February 03, 1946    LOS: 4 PCCM FOLLOW UP NOTE  History of Present Illness: 65 yo WM with COPD, OSA and CAD brought to [facility] on 12/25 with altered mental status and respiratory difficulties.  Reports drinking four bottles of hard liquor the night before.  ED workup revealed RLL infiltrate, elevated cardiac enzymes/BNP and hypercarbic respiratory failure.  Heparin gtt was started and patient was placed on BiPAP. Transferred to [facility] ICU for further management.   Lines / Drains: 12/25  Foley  Cultures: 12/25  UC >> negative 12/25  BC>>>  Antibiotics:  12/25  Ceftriaxone >> 12/28 12/25  Moxifloxacin>>>  Tests / Events: 12/25  Presented to [facility] with AMS/SOB.  Subjective: Still confused.  Vital Sign Temp:  [97.5 F (36.4 C)-98.9 F (37.2 C)] 98.9 F (37.2 C) (12/29 1100) Pulse Rate:  [58-98] 92  (12/29 1000) Resp:  [16-28] 27  (12/29 1000) BP: (119-172)/(69-112) 172/104 mmHg (12/29 1000) SpO2:  [82 %-100 %] 98 % (12/29 1214) Weight:  [119.3 kg (263 lb 0.1 oz)] 263 lb 0.1 oz (119.3 kg) (12/29 0446)      . sodium chloride 10 mL/hr at 10/06/11 1701    Intake/Output Summary (Last 24 hours) at 10/09/11 1420 Last data filed at 10/09/11 1000  Gross per 24 hour  Intake    570 ml  Output   3100 ml  Net  -2530 ml     Physical Examination: General:  confused, but not in acute distress Neuro:  Confused, nonfocal   HEENT:  PERRL Neck:  Supple, no JVD   Cardiovascular:  Irregular rhythm, controlled rate, no murmurs Lungs:  Bilateral diminished air entry, occ rhonchi Abdomen:  Obese, lage panus, bowel sounds present Musculoskeletal:  Chronic stasis dermatitis / bilateral pitting edema lower extremities Skin:  Multiple abrasions, anasarca   Labs and Imaging:  Pcxr: 12/28 bilateral airspace disease.   Lab 10/08/11 0430 10/07/11 0407 10/06/11 0530  NA 138 139 136  K 4.2 4.3 5.5*  CL 98 99 97  CO2 30 31 29   BUN 56*  71* 68*  CREATININE 1.01 1.35 1.73*  GLUCOSE 128* 121* 207*    Lab 10/08/11 0430 10/07/11 0407 10/06/11 0530  HGB 10.1* 9.7* 9.9*  HCT 33.9* 32.9* 33.6*  WBC 9.9 10.2 12.7*  PLT 178 165 168      Assessment and Plan:  Metabolic / alcoholic encephalopathy.  History of alcohol abuse.  High risk of withdrawal. Remains confused.  Plan: -->  Thiamine -->  Folate -->  CIWA q6h, prn ativan  -->  Prozac (preadmission)  Acute exacerbation of COPD. Underlying OHS Plan: -->  Bronchodilators -->  Flovent (preadmission) -->  Prednisone, started tapering -->  Supplemental oxygen -->  eval for CPAP in out-pt setting  CAP  versus aspiration pneumonia.  RLL infiltrate initial CXR, now with more edema pattern 12/27-28 Plan: -->  Avelox / Ceftriaxone on admission, narrow to PO avelox alone on 12/28 -->  follow CXR  -->diurese as CXR tolerates/ on spironolactone   Elevated cardiac enzymes (ACS vs. demand ischemia).  ECG>>>atrial fibrillation, controlled rate, no ST-T changes. Stress NSTEMI due to PNA and resp status -->  ASA, Metoprolol, Zocor as preadmission -->  Appreciate Dr Sinclair Grooms help - possible perfusion stress testing as an outpt  Acute on chronic systolic congestive heart failure.  Elevated BNP in ED.  Anasarca. Good negative fluid balance on spironolactone.   Lab 10/08/11 0430 10/07/11 0407 10/06/11 0530 10/05/11 2013  CREATININE 1.01 1.35 1.73* 1.85*  -->  Lasix as BP and renal fxn will permit -->  TTE repeated 12/26, stable -->  Follow up creatine  Atrial fibrillation with controlled rate.   Plan: -->no change in rx -->  Not a candidate for coumadin per cardiology past notes (fall risk)  Deconditioning Plan: -PT/Ot and CM eval. Looks like he will require SNF  Best practices / Disposition -->status SDU -->full code -->DVT Px heparin subcut -->GI Px is PPI (prior to admission) -->diet - heart healthy  Patient seen and examined, agree with above note.  I  dictated the care and orders written for this patient under my direction.  Koren Bound, M.D. [phone]

## 2011-10-09 NOTE — Progress Notes (Signed)
SUBJECTIVE:  Confused  OBJECTIVE:   Vitals:   Filed Vitals:   10/09/11 0759 10/09/11 0800 10/09/11 0900 10/09/11 1000  BP: 162/85 141/89 119/69 172/104  Pulse: 80 81 78 92  Temp: 98.3 F (36.8 C)     TempSrc: Oral     Resp: 26  22 27   Height:      Weight:      SpO2: 96%  82% 97%   I&O's:   Intake/Output Summary (Last 24 hours) at 10/09/11 1124 Last data filed at 10/09/11 1000  Gross per 24 hour  Intake    840 ml  Output   3600 ml  Net  -2760 ml   TELEMETRY: Reviewed telemetry pt in AFib     PHYSICAL EXAM General: Well developed, well nourished, in no acute distress Head: Eyes PERRLA, No xanthomas.   Normal cephalic and atramatic  Lungs:   Clear bilaterally to auscultation anteriorly Heart:  Irregularly irregular Abdomen:Obese  Extremities:  Purple discoloration of the toes bilaterally with some ulceration on the toes.  2+ DP pulses bilaterally Neuro: Confused    LABS: Basic Metabolic Panel:  Basename 10/08/11 0430 10/07/11 0407  NA 138 139  K 4.2 4.3  CL 98 99  CO2 30 31  GLUCOSE 128* 121*  BUN 56* 71*  CREATININE 1.01 1.35  CALCIUM 9.7 9.4  MG 2.0 2.3  PHOS 2.6 3.9   Liver Function Tests: No results found for this basename: AST:2,ALT:2,ALKPHOS:2,BILITOT:2,PROT:2,ALBUMIN:2 in the last 72 hours No results found for this basename: LIPASE:2,AMYLASE:2 in the last 72 hours CBC:  Basename 10/08/11 0430 10/07/11 0407  WBC 9.9 10.2  NEUTROABS -- --  HGB 10.1* 9.7*  HCT 33.9* 32.9*  MCV 87.1 88.7  PLT 178 165   Cardiac Enzymes:  Basename 10/06/11 1349  CKTOTAL 224  CKMB 5.1*  CKMBINDEX --  TROPONINI 0.53*   BNP: No components found with this basename: POCBNP:3 D-Dimer: No results found for this basename: DDIMER:2 in the last 72 hours Hemoglobin A1C: No results found for this basename: HGBA1C in the last 72 hours Fasting Lipid Panel: No results found for this basename: CHOL,HDL,LDLCALC,TRIG,CHOLHDL,LDLDIRECT in the last 72 hours Thyroid  Function Tests: No results found for this basename: TSH,T4TOTAL,FREET3,T3FREE,THYROIDAB in the last 72 hours Anemia Panel: No results found for this basename: VITAMINB12,FOLATE,FERRITIN,TIBC,IRON,RETICCTPCT in the last 72 hours Coag Panel:   Lab Results  Component Value Date   INR 1.12 04/05/2010   INR 1.1 11/20/2008    RADIOLOGY: Dg Chest Port 1 View  10/08/2011  *RADIOLOGY REPORT*  Clinical Data: Pneumonia  PORTABLE CHEST - 1 VIEW  Comparison: Yesterday  Findings: Low volumes.  Diffuse edema.  No pneumothorax.  Mild cardiomegaly.  IMPRESSION: Stable edema.  Original Report Authenticated By: Donavan Burnet, M.D.   Dg Chest Port 1 View  10/07/2011  *RADIOLOGY REPORT*  Clinical Data: Endotracheal tube, shortness of breath.  PORTABLE CHEST - 1 VIEW  Comparison: 10/06/2011.  Findings: Trachea is midline.  Heart size stable.  Lungs are somewhat low in volume with diffuse bilateral air space disease. Focality in the right perihilar region has dissipated somewhat.  No definite pleural fluid.  IMPRESSION: Diffuse bilateral air space disease with slight improvement in the right perihilar consolidation.  Original Report Authenticated By: Reyes Ivan, M.D.   Dg Chest Port 1 View  10/06/2011  *RADIOLOGY REPORT*  Clinical Data: Intubated patient.  Short of breath.  PORTABLE CHEST - 1 VIEW  Comparison: 10/05/2011.  Findings: Improved aeration of the right upper  lobe with persistent airspace disease respecting the major fissure.  Cardiomegaly. 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 Monitoring leads are projected over the chest.  The left costophrenic angle is excluded from view.  IMPRESSION: Improving aeration with persistent right lung airspace disease, more focal in the right upper lobe.  Original Report Authenticated By: Andreas Newport, M.D.      ASSESSMENT: AFib, HTN, CAD, mild LV dysfunction  PLAN:  1) Metoprolol for rate control.  Will increase to 75 mg PO BID as this may help  with BP as well.  2) Not a long term coumadin candidate due to alcohol abuse.  Could consider IV heparin while in house. 3) Appears to have LE arterial insufficiency by exam but has strong DP pulses bilaterally.   4) May need ACE-I in the future if renal function will tolerate. Corky Crafts., MD  10/09/2011  11:24 AM

## 2011-10-10 ENCOUNTER — Inpatient Hospital Stay (HOSPITAL_COMMUNITY): Payer: Medicare Other

## 2011-10-10 LAB — CBC
MCH: 26 pg (ref 26.0–34.0)
MCHC: 28.9 g/dL — ABNORMAL LOW (ref 30.0–36.0)
MCV: 90.2 fL (ref 78.0–100.0)
Platelets: 163 10*3/uL (ref 150–400)
RBC: 4.3 MIL/uL (ref 4.22–5.81)
RDW: 17.9 % — ABNORMAL HIGH (ref 11.5–15.5)

## 2011-10-10 LAB — GLUCOSE, CAPILLARY
Glucose-Capillary: 144 mg/dL — ABNORMAL HIGH (ref 70–99)
Glucose-Capillary: 190 mg/dL — ABNORMAL HIGH (ref 70–99)
Glucose-Capillary: 301 mg/dL — ABNORMAL HIGH (ref 70–99)

## 2011-10-10 LAB — BASIC METABOLIC PANEL
CO2: 31 mEq/L (ref 19–32)
Calcium: 9.6 mg/dL (ref 8.4–10.5)
Creatinine, Ser: 0.75 mg/dL (ref 0.50–1.35)
GFR calc non Af Amer: >90 mL/min (ref >90)
Sodium: 139 mEq/L (ref 135–145)

## 2011-10-10 MED ORDER — INSULIN ASPART 100 UNIT/ML ~~LOC~~ SOLN
0.0000 [IU] | Freq: Every day | SUBCUTANEOUS | Status: DC
  Administered 2011-10-10: 2 [IU] via SUBCUTANEOUS
  Administered 2011-10-12 – 2011-10-13 (×2): 3 [IU] via SUBCUTANEOUS
  Administered 2011-10-14 – 2011-10-15 (×2): 2 [IU] via SUBCUTANEOUS

## 2011-10-10 MED ORDER — HYDROCODONE-ACETAMINOPHEN 10-325 MG PO TABS
1.0000 | ORAL_TABLET | Freq: Four times a day (QID) | ORAL | Status: DC | PRN
  Administered 2011-10-10: 1 via ORAL
  Filled 2011-10-10: qty 1

## 2011-10-10 MED ORDER — INSULIN GLARGINE 100 UNIT/ML ~~LOC~~ SOLN
5.0000 [IU] | Freq: Every day | SUBCUTANEOUS | Status: DC
  Administered 2011-10-10 – 2011-10-15 (×6): 5 [IU] via SUBCUTANEOUS
  Filled 2011-10-10: qty 3

## 2011-10-10 MED ORDER — INSULIN ASPART 100 UNIT/ML ~~LOC~~ SOLN
0.0000 [IU] | Freq: Three times a day (TID) | SUBCUTANEOUS | Status: DC
  Administered 2011-10-11: 3 [IU] via SUBCUTANEOUS
  Administered 2011-10-11 (×2): 5 [IU] via SUBCUTANEOUS
  Administered 2011-10-12 (×2): 3 [IU] via SUBCUTANEOUS
  Administered 2011-10-12: 5 [IU] via SUBCUTANEOUS
  Administered 2011-10-13: 8 [IU] via SUBCUTANEOUS
  Administered 2011-10-13 – 2011-10-14 (×3): 3 [IU] via SUBCUTANEOUS
  Administered 2011-10-14: 8 [IU] via SUBCUTANEOUS
  Administered 2011-10-14: 2 [IU] via SUBCUTANEOUS
  Administered 2011-10-15: 5 [IU] via SUBCUTANEOUS
  Administered 2011-10-15: 3 [IU] via SUBCUTANEOUS
  Administered 2011-10-15: 2 [IU] via SUBCUTANEOUS
  Filled 2011-10-10 (×2): qty 3

## 2011-10-10 MED ORDER — INSULIN ASPART 100 UNIT/ML ~~LOC~~ SOLN
3.0000 [IU] | Freq: Three times a day (TID) | SUBCUTANEOUS | Status: DC
  Administered 2011-10-11 – 2011-10-15 (×15): 3 [IU] via SUBCUTANEOUS

## 2011-10-10 MED ORDER — ALLOPURINOL 300 MG PO TABS
300.0000 mg | ORAL_TABLET | Freq: Every day | ORAL | Status: DC
  Administered 2011-10-10 – 2011-10-16 (×7): 300 mg via ORAL
  Filled 2011-10-10 (×7): qty 1

## 2011-10-10 NOTE — Progress Notes (Signed)
1700  Pt transferred from 2100, alert, slight confusion with forgetfulness, gauze bandage to bil knees covering abrasions from fall at home prior to admission, also 2-3 very small scabbed areas of bil lower legs, uses walker at home, lower legs have edema and are red in color, c/o of foot pain, 02 3.5lpm with good sats, lungs diminished in bases with few crackles, no cough, foley cath in place with clear light yellow urine, pt used bi-pap on 2100, is to be tested, has home equipment but very old daughter states, bed alarm turned on, pt very thirsty.  Plan to continue treatment of PNA, maintain 02 sats & chronic bk& foot pain, keep pt safe, monitor VS, lab results & CBG.  Daughter wishes for pt to return home with them.  Obtain PT/OT order, involve SW, CM with discharge.  Bonney Leitz RN

## 2011-10-10 NOTE — Progress Notes (Signed)
Name: [name] MRN: [id number] DOB: 01/15/1946    LOS: 5 PCCM FOLLOW UP NOTE  History of Present Illness: 65 yo WM with COPD, OSA and CAD brought to [facility] on 12/25 with altered mental status and respiratory difficulties.  Reports drinking four bottles of hard liquor the night before.  ED workup revealed RLL infiltrate, elevated cardiac enzymes/BNP and hypercarbic respiratory failure.  Heparin gtt was started and patient was placed on BiPAP. Transferred to [facility] ICU for further management.   Lines / Drains: 12/25  Foley  Cultures: 12/25  UC >> negative 12/25  BC>>>neg  Antibiotics:  12/25  Ceftriaxone >> 12/28 12/25  Moxifloxacin>>>scheduled to stop at 10d.  Tests / Events: 12/25  Presented to [facility] with AMS/SOB.  Subjective: Still confused.  Vital Sign Temp:  [97.8 F (36.6 C)-98.5 F (36.9 C)] 98.3 F (36.8 C) (12/30 1143) Pulse Rate:  [66-93] 74  (12/30 1300) Resp:  [18-26] 21  (12/30 1300) BP: (115-170)/(60-100) 134/72 mmHg (12/30 1300) SpO2:  [92 %-100 %] 98 % (12/30 1300) FiO2 (%):  [70 %] 70 % (12/29 2300) Weight:  [124.8 kg (275 lb 2.2 oz)] 275 lb 2.2 oz (124.8 kg) (12/30 0500)      . sodium chloride 250 mL (10/10/11 0600)    Intake/Output Summary (Last 24 hours) at 10/10/11 1410 Last data filed at 10/10/11 1300  Gross per 24 hour  Intake   2450 ml  Output   1960 ml  Net    490 ml     Physical Examination: General:  confused, but not in acute distress Neuro:  Confused, nonfocal, + short term memory HEENT:  PERRL Neck:  Supple, no JVD   Cardiovascular:  Irregular rhythm, controlled rate, no murmurs Lungs:  Bilateral diminished air entry,no accessory muscle use.  Abdomen:  Obese, lage panus, bowel sounds present Musculoskeletal:  Chronic stasis dermatitis / bilateral pitting edema lower extremities Skin:  Multiple abrasions, anasarca  Labs and Imaging:  Pcxr: 12/30 bilateral airspace disease. Improved aeration  Lab  10/10/11 0500 10/08/11 0430 10/07/11 0407  NA 139 138 139  K 4.3 4.2 4.3  CL 102 98 99  CO2 31 30 31   BUN 32* 56* 71*  CREATININE 0.75 1.01 1.35  GLUCOSE 157* 128* 121*    Lab 10/10/11 0500 10/08/11 0430 10/07/11 0407  HGB 11.2* 10.1* 9.7*  HCT 38.8* 33.9* 32.9*  WBC 8.0 9.9 10.2  PLT 163 178 165    Assessment and Plan:  Metabolic / alcoholic encephalopathy.  History of alcohol abuse.  High risk of withdrawal. Remains confused.  Plan: -->  Thiamine and  Folate -->  Prozac (preadmission) -->PRN benzo (very low dose) Acute exacerbation of COPD. Presumed underlying OHS Plan: -->  Bronchodilators -->  Flovent (preadmission) -->  Prednisone, started tapering -->  Supplemental oxygen -->  eval for CPAP in out-pt setting, will place him on auto-titration nasal device for now. Will need cont-pulse ox at HS  CAP  versus aspiration pneumonia.  RLL infiltrate initial CXR, now with more edema pattern 12/27-28 Plan: -->  Avelox / Ceftriaxone on admission, narrow to PO avelox alone on 12/28, complete 10 d total rx -->  follow CXR  -->diurese as CXR tolerates/ on spironolactone   Elevated cardiac enzymes (ACS vs. demand ischemia).  ECG>>>atrial fibrillation, controlled rate, no ST-T changes. Stress NSTEMI due to PNA and resp status -->  ASA, Metoprolol, Zocor as preadmission -->  Appreciate Dr Sinclair Grooms help - possible perfusion stress testing as an  outpt  Acute on chronic systolic congestive heart failure.  Elevated BNP in ED.  Anasarca. Good negative fluid balance on spironolactone.   Lab 10/10/11 0500 10/08/11 0430 10/07/11 0407 10/06/11 0530 10/05/11 2013  CREATININE 0.75 1.01 1.35 1.73* 1.85*  -->  spironolactone as BP and renal fxn will permit -->  Follow up creatine  Atrial fibrillation with controlled rate.   Plan: -->no change in rx, rate control focus -->  Not a candidate for coumadin per cardiology past notes (fall risk)  Deconditioning Plan: -PT/Ot and CM eval.  Looks like he will require SNF  Best practices / Disposition -->status SDU -->full code -->DVT Px heparin subcut -->GI Px is PPI (prior to admission) -->diet - heart healthy  Patient seen and examined, agree with above note.  I dictated the care and orders written for this patient under my direction.  Koren Bound, M.D. [phone]

## 2011-10-10 NOTE — Progress Notes (Signed)
SUBJECTIVE:  Feels better.  Was able to feed himself today and has been more alert per the nurse, compared to yesterday.  OBJECTIVE:   Vitals:   Filed Vitals:   10/10/11 0900 10/10/11 1000 10/10/11 1139 10/10/11 1143  BP: 119/70 135/73    Pulse: 78 79    Temp:    98.3 F (36.8 C)  TempSrc:    Oral  Resp: 21 22    Height:      Weight:      SpO2: 99% 100% 100%    I&O's:    Intake/Output Summary (Last 24 hours) at 10/10/11 1200 Last data filed at 10/10/11 1100  Gross per 24 hour  Intake   2730 ml  Output   2185 ml  Net    545 ml   TELEMETRY: Reviewed telemetry pt in AFib     PHYSICAL EXAM General: Well developed, well nourished, in no acute distress Head: Eyes PERRLA, No xanthomas.   Normal cephalic and atramatic  Lungs:   Clear bilaterally to auscultation anteriorly Heart:  Irregularly irregular Abdomen:Obese  Extremities:  Purple discoloration of the toes bilaterally with some ulceration on the toes.  2+ DP pulses bilaterally Neuro: Confused    LABS: Basic Metabolic Panel:  Basename 10/10/11 0500 10/08/11 0430  NA 139 138  K 4.3 4.2  CL 102 98  CO2 31 30  GLUCOSE 157* 128*  BUN 32* 56*  CREATININE 0.75 1.01  CALCIUM 9.6 9.7  MG -- 2.0  PHOS -- 2.6   Liver Function Tests: No results found for this basename: AST:2,ALT:2,ALKPHOS:2,BILITOT:2,PROT:2,ALBUMIN:2 in the last 72 hours No results found for this basename: LIPASE:2,AMYLASE:2 in the last 72 hours CBC:  Basename 10/10/11 0500 10/08/11 0430  WBC 8.0 9.9  NEUTROABS -- --  HGB 11.2* 10.1*  HCT 38.8* 33.9*  MCV 90.2 87.1  PLT 163 178   Cardiac Enzymes: No results found for this basename: CKTOTAL:3,CKMB:3,CKMBINDEX:3,TROPONINI:3 in the last 72 hours BNP: No components found with this basename: POCBNP:3 D-Dimer: No results found for this basename: DDIMER:2 in the last 72 hours Hemoglobin A1C: No results found for this basename: HGBA1C in the last 72 hours Fasting Lipid Panel: No results found  for this basename: CHOL,HDL,LDLCALC,TRIG,CHOLHDL,LDLDIRECT in the last 72 hours Thyroid Function Tests: No results found for this basename: TSH,T4TOTAL,FREET3,T3FREE,THYROIDAB in the last 72 hours Anemia Panel: No results found for this basename: VITAMINB12,FOLATE,FERRITIN,TIBC,IRON,RETICCTPCT in the last 72 hours Coag Panel:   Lab Results  Component Value Date   INR 1.12 04/05/2010   INR 1.1 11/20/2008    RADIOLOGY: Dg Chest Port 1 View  10/08/2011  *RADIOLOGY REPORT*  Clinical Data: Pneumonia  PORTABLE CHEST - 1 VIEW  Comparison: Yesterday  Findings: Low volumes.  Diffuse edema.  No pneumothorax.  Mild cardiomegaly.  IMPRESSION: Stable edema.  Original Report Authenticated By: Donavan Burnet, M.D.       ASSESSMENT: AFib, HTN, CAD, mild LV dysfunction  PLAN:  1) Continue Metoprolol  75 mg PO BID.  BP is better. 2) Not a long term coumadin candidate due to alcohol abuse.  Could consider IV heparin while in house. 3) Appears to have LE arterial insufficiency by the appearance of his toes, but has strong DP pulses bilaterally.   4) May need ACE-I in the future if renal function will tolerate. Corky Crafts., MD  10/10/2011  12:00 PM

## 2011-10-10 NOTE — Progress Notes (Signed)
Physical Therapy Evaluation Patient Details Name: [name] MRN: [id number] DOB: [birth year] Today's Date: 10/10/2011  Problem List:  Patient Active Problem List  Diagnoses  . DM  . HYPERLIPIDEMIA  . OBSTRUCTIVE SLEEP APNEA  . HYPERTENSION  . DIASTOLIC HEART FAILURE, CHRONIC  . CAROTID STENOSIS  . CHRONIC OBSTRUCTIVE PULMONARY DISEASE  . EDEMA  . Dyspnea  . CAD  . ETOH abuse  . Atrial fibrillation  . Renal insufficiency  . NSTEMI (non-ST elevated myocardial infarction)    Past Medical History:  Past Medical History  Diagnosis Date  . CHRONIC OBSTRUCTIVE PULMONARY DISEASE 06/20/2009  . OBSTRUCTIVE SLEEP APNEA 06/20/2009  . CAROTID STENOSIS 06/20/2009    A. 08/2001 s/p L CEA;  B.   09/14/11 - Carotid U/S - 40-59% bilateral stenosis, left CEA patch angioplasty is patent  . DM 06/20/2009  . CAD 06/20/2009    A.  08/2000 - s/p CABG x 4 - LIMA-LAD, Left Radial-OM, VG-DIAG, VG-RCA;  B. Neg. MV  2010  . HYPERLIPIDEMIA 06/20/2009  . HYPERTENSION 06/20/2009  . Overweight   . Hypothyroidism   . Low back pain   . Asthma     as child  . Pneumonia   . Atrial fibrillation     Not felt to be coumadin candidate 2/2 ETOH use.  Marland Kitchen Hypothyroidism   . ETOH abuse   . History of tobacco abuse     remote - quit 1970   Past Surgical History:  Past Surgical History  Procedure Date  . Carotid endarterectomy 2002    left  . Coronary artery bypass graft     x 4 - 2001    PT Assessment/Plan/Recommendation PT Assessment Clinical Impression Statement: 65 yo male admitted with resp distress, ETOH use presents with decr functional mobility; will benefit from acute PT to maximize independence and safety with mobility/transfers/amb/steps and to facilitate dc planning; if good progress, may be able to dc home with prn family (daughter) assist; if slow progress, must consider SNF for rehab PT Recommendation/Assessment: Patient will need skilled PT in the acute care venue Barriers to Discharge  Comments: Will need more info re: exactly how much assistance is available to pt at home PT Therapy Diagnosis : Difficulty walking;Abnormality of gait;Generalized weakness;Altered mental status PT Plan PT Frequency: Min 3X/week PT Treatment/Interventions: DME instruction;Gait training;Stair training;Functional mobility training;Therapeutic activities;Therapeutic exercise;Balance training;Cognitive remediation;Patient/family education PT Recommendation Recommendations for Other Services: OT consult Follow Up Recommendations: Home health PT;24 hour supervision/assistance;Skilled nursing facility (depends on progress) Equipment Recommended: 3 in 1 bedside comode PT Goals  Acute Rehab PT Goals PT Goal Formulation: Patient unable to participate in goal setting Time For Goal Achievement: 2 weeks Pt will go Supine/Side to Sit: with supervision;with HOB 0 degrees Pt will go Sit to Supine/Side: with supervision;with HOB 0 degrees Pt will go Sit to Stand: with supervision Pt will go Stand to Sit: with supervision Pt will Transfer Bed to Chair/Chair to Bed: with supervision Pt will Ambulate: >150 feet;with supervision;with rolling walker Pt will Go Up / Down Stairs: 3-5 stairs;with rail(s);with supervision  PT Evaluation Precautions/Restrictions  Precautions Precautions: Fall Restrictions Weight Bearing Restrictions: No Prior Functioning  Home Living Lives With: Daughter Receives Help From: Family Type of Home: House Home Layout: One level Home Access: Stairs to enter Entrance Stairs-Rails: Doctor, general practice of Steps: 3 Home Adaptive Equipment: Walker - rolling (need more info re: home equipmqnt and setup) Additional Comments: Pt states he lives in [place] -- we question whether this is  correct Prior Function Level of Independence: Requires assistive device for independence Able to Take Stairs?: Yes Cognition Cognition Arousal/Alertness: Awake/alert Overall  Cognitive Status: Impaired Attention: Impaired Current Attention Level: Sustained (extremely distractable) Orientation Level: Oriented to person Safety/Judgement: Decreased awareness of safety precautions;Decreased safety judgement for tasks assessed Decreased Safety/Judgement: Impulsive;Decreased awareness of need for assistance Safety/Judgement - Other Comments: Pt consistently asking to walk around the room, however difficulty managing pivot steps to recliner Awareness of Deficits: Decreased awareness of deficits Cognition - Other Comments: per RN, pt's cognition is improved compared to yesterday; pt required cues like "there are doctors and nurses in this building" to identify that he's in the hospital Sensation/Coordination Sensation Light Touch: Appears Intact Coordination Gross Motor Movements are Fluid and Coordinated: No Coordination and Movement Description: Innefficient movement with bed mobility and scooting Extremity Assessment RUE Assessment RUE Assessment: Within Functional Limits LUE Assessment LUE Assessment: Within Functional Limits RLE Assessment RLE Assessment: Exceptions to [facility] RLE Strength RLE Overall Strength Comments: grossly decreased strength, requiring physical assist for sit to stand LLE Assessment LLE Assessment: Exceptions to [facility] LLE Strength LLE Overall Strength Comments: grossly decreased strength, requiring physical assist for sit to stand Mobility (including Balance) Bed Mobility Bed Mobility: Yes Supine to Sit: 1: +2 Total assist;Patient percentage (comment);HOB elevated (Comment degrees) (HOB elevated to near-sitting; pt = 65%) Supine to Sit Details (indicate cue type and reason): verbal and tactile cues for technique, and to initiate and attend to task Transfers Transfers: Yes Sit to Stand: 1: +2 Total assist;Patient percentage (comment);From bed;From chair/3-in-1;With upper extremity assist (2 reps; pt = 65-75%) Sit to Stand Details (indicate  cue type and reason): pt performed sit to stand relatively well with UE assist, especially with armrests; cues for proper foot positioning (flat on floor, close to pt) Stand to Sit: 1: +2 Total assist;To chair/3-in-1;With upper extremity assist;With armrests (pt = 70%) Stand to Sit Details: cues for safe hand positioning and to control descent Ambulation/Gait Ambulation/Gait: Yes Ambulation/Gait Assistance: 1: +2 Total assist;Patient percentage (comment) (pt=65%) Ambulation/Gait Assistance Details (indicate cue type and reason): cues to continue steps; pivot steps from bed to chair, approx 6 steps total with RW; short, shuffling sidesteps; cues for optimal positioning in front of chair Ambulation Distance (Feet): 2 Feet Assistive device: Rolling walker Gait Pattern: Shuffle       End of Session PT - End of Session Equipment Utilized During Treatment: Gait belt Activity Tolerance: Patient tolerated treatment well Patient left: in chair;with call bell in reach (reinforced to pt to call for assist to get up) Nurse Communication: Mobility status for transfers General Behavior During Session: [facility] for tasks performed Cognition: Impaired Cognitive Impairment: disoriented to place, time, and situation; Follows commands consisitently, which is an improvement per RN  Van Clines [facility] [place], [place] [phone]  10/10/2011, 11:34 AM

## 2011-10-11 LAB — GLUCOSE, CAPILLARY
Glucose-Capillary: 224 mg/dL — ABNORMAL HIGH (ref 70–99)
Glucose-Capillary: 298 mg/dL — ABNORMAL HIGH (ref 70–99)

## 2011-10-11 LAB — BASIC METABOLIC PANEL
BUN: 29 mg/dL — ABNORMAL HIGH (ref 6–23)
Calcium: 9.2 mg/dL (ref 8.4–10.5)
Creatinine, Ser: 0.83 mg/dL (ref 0.50–1.35)
GFR calc Af Amer: >90 mL/min (ref >90)
GFR calc non Af Amer: 89 mL/min — ABNORMAL LOW (ref >90)

## 2011-10-11 LAB — CBC
HCT: 36.5 % — ABNORMAL LOW (ref 39.0–52.0)
MCHC: 29 g/dL — ABNORMAL LOW (ref 30.0–36.0)
MCV: 89 fL (ref 78.0–100.0)
RDW: 17.8 % — ABNORMAL HIGH (ref 11.5–15.5)

## 2011-10-11 MED ORDER — HYDROCODONE-ACETAMINOPHEN 10-325 MG PO TABS
2.0000 | ORAL_TABLET | Freq: Four times a day (QID) | ORAL | Status: DC | PRN
  Administered 2011-10-11 – 2011-10-13 (×5): 2 via ORAL
  Filled 2011-10-11 (×5): qty 2

## 2011-10-11 NOTE — Progress Notes (Signed)
Inpatient Diabetes Program Recommendations  AACE/ADA: New Consensus Statement on Inpatient Glycemic Control (2009)  Target Ranges:  Prepandial:   less than 140 mg/dL      Peak postprandial:   less than 180 mg/dL (1-2 hours)      Critically ill patients:  140 - 180 mg/dL   Reason for Visit: Hyperglycemia continued  Inpatient Diabetes Program Recommendations Insulin - Basal: Please increase Lantus to 15 units (Pt wt is 129 kg (initial dosing based on 0..2 units/kg =26 units) Correction (SSI): 2 Insulin - Meal Coverage: xxx HgbA1C: Please check A1C to assess pre-hospital glycemic control.  Note: AACE/ADA recommends A1C documentation in the hospital within the past 3 months

## 2011-10-11 NOTE — Progress Notes (Signed)
Occupational Therapy Evaluation Patient Details Name: [name] MRN: [id number] DOB: 17-Jun-1946 Today's Date: 10/11/2011  Problem List:  Patient Active Problem List  Diagnoses  . DM  . HYPERLIPIDEMIA  . OBSTRUCTIVE SLEEP APNEA  . HYPERTENSION  . DIASTOLIC HEART FAILURE, CHRONIC  . CAROTID STENOSIS  . CHRONIC OBSTRUCTIVE PULMONARY DISEASE  . EDEMA  . Dyspnea  . CAD  . ETOH abuse  . Campath-induced atrial fibrillation  . Renal insufficiency  . NSTEMI (non-ST elevated myocardial infarction)    Past Medical History:  Past Medical History  Diagnosis Date  . CHRONIC OBSTRUCTIVE PULMONARY DISEASE 06/20/2009  . OBSTRUCTIVE SLEEP APNEA 06/20/2009  . CAROTID STENOSIS 06/20/2009    A. 08/2001 s/p L CEA;  B.   09/14/11 - Carotid U/S - 40-59% bilateral stenosis, left CEA patch angioplasty is patent  . DM 06/20/2009  . CAD 06/20/2009    A.  08/2000 - s/p CABG x 4 - LIMA-LAD, Left Radial-OM, VG-DIAG, VG-RCA;  B. Neg. MV  2010  . HYPERLIPIDEMIA 06/20/2009  . HYPERTENSION 06/20/2009  . Overweight   . Hypothyroidism   . Low back pain   . Asthma     as child  . Pneumonia   . Campath-induced atrial fibrillation     Not felt to be coumadin candidate 2/2 ETOH use.  Marland Kitchen Hypothyroidism   . ETOH abuse   . History of tobacco abuse     remote - quit 1970   Past Surgical History:  Past Surgical History  Procedure Date  . Carotid endarterectomy 2002    left  . Coronary artery bypass graft     x 4 - 2001    OT Assessment/Plan/Recommendation OT Assessment Clinical Impression Statement: Pt was difficult to assess due to lethargy.  He requires max assist for bathing and dressing and mod for other areas of ADL primarily because of lethargy.  Will follow acutely.  Will need SNF if pt does not progress quickly. OT Recommendation/Assessment: Patient will need skilled OT in the acute care venue OT Problem List: Decreased strength;Decreased activity tolerance;Impaired balance (sitting and/or  standing);Decreased coordination;Decreased cognition;Decreased safety awareness;Decreased knowledge of use of DME or AE;Obesity;Impaired UE functional use OT Therapy Diagnosis : Generalized weakness;Cognitive deficits OT Plan OT Frequency: Min 1X/week OT Treatment/Interventions: Self-care/ADL training;Therapeutic activities;Patient/family education;Cognitive remediation/compensation OT Recommendation Follow Up Recommendations: Skilled nursing facility Equipment Recommended: Defer to next venue Individuals Consulted Consulted and Agree with Results and Recommendations: Patient OT Goals Acute Rehab OT Goals OT Goal Formulation: Patient unable to participate in goal setting Time For Goal Achievement: 2 weeks ADL Goals Pt Will Perform Eating: with set-up;Sitting, chair ADL Goal: Eating - Progress: Not met Pt Will Perform Grooming: with set-up;Sitting, edge of bed ADL Goal: Grooming - Progress: Not met Pt Will Perform Upper Body Bathing: with min assist;Sitting, edge of bed ADL Goal: Upper Body Bathing - Progress: Not met Pt Will Perform Upper Body Dressing: with min assist;Sitting, bed ADL Goal: Upper Body Dressing - Progress: Not met Pt Will Transfer to Toilet: with min assist;3-in-1 ADL Goal: Toilet Transfer - Progress: Not met Miscellaneous OT Goals Miscellaneous OT Goal #1: Pt will remain alert for ADL x 10 minutes for ADL. OT Goal: Miscellaneous Goal #1 - Progress: Not met  OT Evaluation Precautions/Restrictions  Precautions Precautions: Fall Precaution Comments: Significant weakness. Restrictions Weight Bearing Restrictions: No Prior Functioning Home Living Lives With: Daughter Receives Help From: Family Type of Home: House Home Layout: One level Home Access: Stairs to enter Entrance Stairs-Rails: Right;Left Entrance  Stairs-Number of Steps: 3 Bathroom Shower/Tub: Health visitor: Standard Home Adaptive Equipment: Walker - rolling;Straight  cane Additional Comments: Unclear whether pt is an accurate historian. Prior Function Level of Independence: Requires assistive device for independence;Needs assistance with homemaking Able to Take Stairs?: Yes Driving: Yes ADL ADL Eating/Feeding: Simulated;Maximal assistance Eating/Feeding Details (indicate cue type and reason): pt limited by lethargy, repeated fell asleep Where Assessed - Eating/Feeding: Chair Grooming: Performed;Wash/dry face;Moderate assistance Grooming Details (indicate cue type and reason): limited by lethargy Where Assessed - Grooming: Sitting, chair Upper Body Bathing: Simulated;Maximal assistance Where Assessed - Upper Body Bathing: Sitting, chair Lower Body Bathing: Maximal assistance;Simulated Where Assessed - Lower Body Bathing: Sitting, chair;Sit to stand from chair Upper Body Dressing: Performed;Moderate assistance Where Assessed - Upper Body Dressing: Sitting, chair Lower Body Dressing: +1 Total assistance;Performed Where Assessed - Lower Body Dressing: Sitting, chair;Sit to stand from chair Toilet Transfer: Other (comment) (deferred due to LE buckling per PT note in absence of second) Equipment Used: Rolling walker ADL Comments: Pt required nearly constant cues and stimulation to remain alert. Vision/Perception  Vision - History Baseline Vision: No visual deficits Patient Visual Report: No change from baseline Vision - Assessment Additional Comments: Pt states he does not wear glasses. Cognition Cognition Arousal/Alertness: Lethargic Overall Cognitive Status: Impaired Attention: Impaired Current Attention Level: Focused Orientation Level: Oriented to person;Oriented to place Safety/Judgement: Decreased awareness of safety precautions;Decreased safety judgement for tasks assessed Decreased Safety/Judgement: Impulsive;Decreased awareness of need for assistance Awareness of Deficits: Decreased awareness of  deficits Sensation/Coordination Sensation Light Touch: Appears Intact Hot/Cold: Appears Intact Proprioception: Appears Intact Coordination Gross Motor Movements are Fluid and Coordinated: No Fine Motor Movements are Fluid and Coordinated: No Coordination and Movement Description: Decreased direct reach to items on tray table. Extremity Assessment RUE Assessment RUE Assessment:  (Full AROM, unable to assess strength due to lethargy) LUE Assessment LUE Assessment:  (full AROM, unable to assess strength due to lethargy) Mobility  Bed Mobility Bed Mobility: No, pt already up in chair. Transfers Sit to Stand: 3: Mod assist;From chair/3-in-1;With upper extremity assist Sit to Stand Details (indicate cue type and reason): verbal cues for hand placement Stand to Sit: 3: Mod assist;To chair/3-in-1;Other (comment) (poor control of descent) Stand to Sit Details: Verbal/tactile cues to use armrests and LEs to control descent. Pt unable first attempt and had to be lowered by PT as his legs gave way (Max assist), second attempt pt able to lower self much better (min assist).  End of Session OT - End of Session Equipment Utilized During Treatment: Gait belt Activity Tolerance: Patient limited by fatigue Patient left: in chair;with call bell in reach General Behavior During Session: Lethargic Cognition: Impaired Cognitive Impairment: Impaired orientation to situation, follows simple commands.   Evern Bio 10/11/2011, 1:59 PM

## 2011-10-11 NOTE — Progress Notes (Signed)
Patient ID: [name], male   DOB: 05-04-1946, 65 y.o.   MRN: [id number]  Name: [name] MRN: [id number] DOB: [birth year]    LOS: 6  Care transferred to [facility] 10/11/11  History of Present Illness: 65 yo WM with COPD, OSA and CAD brought to [facility] on 12/25 with altered mental status and respiratory difficulties.  Reports drinking four bottles of hard liquor the night before.  ED workup revealed RLL infiltrate, elevated cardiac enzymes/BNP and hypercarbic respiratory failure.  Heparin gtt was started and patient was placed on BiPAP. Transferred to [facility] ICU for further management.   Lines / Drains: 12/25  Foley  Cultures: 12/25  UC >> negative 12/25  BC>>>neg  Antibiotics:  12/25  Ceftriaxone >> 12/28 12/25  Moxifloxacin>>>scheduled to stop at 10d.  Tests / Events: 12/25  Presented to [facility] with AMS/SOB.  Subjective: Still confused.  Vital Sign Temp:  [97.4 F (36.3 C)-98.4 F (36.9 C)] 97.4 F (36.3 C) (12/31 5638) Pulse Rate:  [73-84] 81  (12/31 0613) Resp:  [20-21] 21  (12/31 0613) BP: (146-159)/(78-89) 146/78 mmHg (12/31 0613) SpO2:  [95 %-100 %] 96 % (12/31 1030) Weight:  [128.4 kg (283 lb 1.1 oz)] 283 lb 1.1 oz (128.4 kg) (12/31 7564)      . sodium chloride 20 mL/hr at 10/10/11 2326    Intake/Output Summary (Last 24 hours) at 10/11/11 1602 Last data filed at 10/11/11 0900  Gross per 24 hour  Intake 1120.33 ml  Output   1325 ml  Net -204.67 ml     Physical Examination: General:  Lethargic, sleepy, but answers questions appropriately.  Neuro: nonfocal, + short term memory HEENT:  PERRL Neck:  Supple, no JVD   Cardiovascular:  Irregular rhythm, controlled rate, no murmurs Lungs:  Bilateral diminished air entry,no accessory muscle use.  Abdomen:  Obese, lage panus, bowel sounds present Musculoskeletal:  Chronic stasis dermatitis / bilateral pitting edema lower extremities.  Pretibial areas dark pink bilaterally Skin:   Multiple abrasions, anasarca  Labs and Imaging:  Pcxr: 12/30 bilateral airspace disease. Improved aeration  Lab 10/11/11 0620 10/10/11 0500 10/08/11 0430  NA 135 139 138  K 4.2 4.3 4.2  CL 99 102 98  CO2 30 31 30   BUN 29* 32* 56*  CREATININE 0.83 0.75 1.01  GLUCOSE 246* 157* 128*    Lab 10/11/11 0620 10/10/11 0500 10/08/11 0430  HGB 10.6* 11.2* 10.1*  HCT 36.5* 38.8* 33.9*  WBC 9.9 8.0 9.9  PLT 169 163 178    Assessment and Plan:  Metabolic / alcoholic encephalopathy.  History of alcohol abuse.  Plan: -->  Thiamine and  Folate -->  Prozac (preadmission) -->PRN benzo (very low dose)  12.31 - uncertain why lethargy persists.  Need to discuss with attending.  Acute exacerbation of COPD. Presumed underlying OHS Plan: -->  Bronchodilators -->  Flovent (preadmission) -->  Prednisone, started tapering -->  Supplemental oxygen -->  eval for CPAP in out-pt setting, will place him on auto-titration nasal device for now. Will need cont-pulse ox at HS  Breathing comfortably, continue current plan of care.   CAP  versus aspiration pneumonia.  RLL infiltrate initial CXR, now with more edema pattern 12/27-28 Plan: -->  Avelox / Ceftriaxone on admission, narrow to PO avelox alone on 12/28, complete 10 d total rx -->  follow CXR  -->diurese as CXR tolerates/ on spironolactone.  Chest xray much improved.  Elevated cardiac enzymes (ACS vs. demand ischemia).  ECG>>>atrial fibrillation, controlled  rate, no ST-T changes. Stress NSTEMI due to PNA and resp status -->  ASA, Metoprolol, Zocor as preadmission -->  Appreciate Dr Sinclair Grooms help - possible perfusion stress testing as an outpt  Acute on chronic systolic congestive heart failure.  Elevated BNP in ED.  Anasarca. Good negative fluid balance on spironolactone.   Lab 10/11/11 0620 10/10/11 0500 10/08/11 0430 10/07/11 0407 10/06/11 0530  CREATININE 0.83 0.75 1.01 1.35 1.73*  -->  spironolactone as BP and renal fxn will  permit -->  Follow up creatine.  Atrial fibrillation with controlled rate.   Plan: -->no change in rx, rate control focus -->  Not a candidate for coumadin per cardiology past notes (fall risk)  Deconditioning Plan: -PT/Ot and CM eval. Looks like he will require SNF. SW consult ordered for placement.  Best practices / Disposition -->status SDU -->full code -->DVT Px heparin subcut -->GI Px is PPI (prior to admission) -->diet - heart healthy   York, Tora Kindred, PA -C [facility], [facility]   Patient seen by me. Fully evaluated  and examined. I agree with assessment and plan

## 2011-10-11 NOTE — Progress Notes (Signed)
Physical Therapy Treatment Patient Details Name: [name] MRN: [id number] DOB: 1946/03/23 Today's Date: 10/11/2011  PT Assessment/Plan  PT - Assessment/Plan Comments on Treatment Session: Pt still with significant weakness however making gains. Pt still requires 2 person assist to stand. With sitting pt attempted to reach for armrest and his legs were unable to hold him, PT had to safely lower pt to chair as his legs buckled indicating significant weakness and risk for falls.  PT Plan: Discharge plan remains appropriate PT Frequency: Min 3X/week Follow Up Recommendations: 24 hour supervision/assistance;Skilled nursing facility (depends on progress) Equipment Recommended: 3 in 1 bedside comode PT Goals  Acute Rehab PT Goals PT Goal Formulation: Patient unable to participate in goal setting Time For Goal Achievement: 2 weeks Pt will go Supine/Side to Sit: with supervision;with HOB 0 degrees PT Goal: Supine/Side to Sit - Progress: Progressing toward goal Pt will go Sit to Supine/Side: with supervision;with HOB 0 degrees PT Goal: Sit to Supine/Side - Progress: Progressing toward goal Pt will go Sit to Stand: with supervision PT Goal: Sit to Stand - Progress: Progressing toward goal Pt will go Stand to Sit: with supervision PT Goal: Stand to Sit - Progress: Progressing toward goal Pt will Transfer Bed to Chair/Chair to Bed: with supervision PT Transfer Goal: Bed to Chair/Chair to Bed - Progress: Progressing toward goal Pt will Ambulate: >150 feet;with supervision;with rolling walker Pt will Go Up / Down Stairs: 3-5 stairs;with rail(s);with supervision  PT Treatment Precautions/Restrictions  Precautions Precautions: Fall Precaution Comments: Significant weakness. Restrictions Weight Bearing Restrictions: No Mobility (including Balance) Bed Mobility Bed Mobility: Yes Rolling Left: 5: Supervision;With rail Rolling Left Details (indicate cue type and reason): Repeated verbal  cues for sequencing and efficiency. Visual cues for use of rail.  Left Sidelying to Sit: 4: Min assist Left Sidelying to Sit Details (indicate cue type and reason): Assist for Bil. LEs secondary to confusion. Min assist through trunk to bring to full upright sitting.  Transfers Transfers: Yes Sit to Stand: 1: +2 Total assist;With armrests;From bed;From chair/3-in-1;With upper extremity assist (pt= 80%) Sit to Stand Details (indicate cue type and reason): Verbal cues for UE placement, cues for body positioning for most efficient stand. Pt performed 2x.  Stand to Sit: To chair/3-in-1;2: Max assist Stand to Sit Details: Verbal/tactile cues to use armrests and LEs to control descent. Pt unable first attempt and had to be lowered by PT as his legs gave way (Max assist), second attempt pt able to lower self much better (min assist).  Ambulation/Gait Ambulation/Gait: Yes Ambulation/Gait Assistance: 3: Mod assist Ambulation/Gait Assistance Details (indicate cue type and reason): Moderate assistance secondary to weakness. Pt followed with chair. Verbal cues for safe distance of RW.  Ambulation Distance (Feet): 25 Feet (total with one seated rest break in the middle. ) Assistive device: Rolling walker Gait Pattern: Step-to pattern;Shuffle;Trunk flexed;Decreased step length - right;Decreased step length - left  Posture/Postural Control Posture/Postural Control: No significant limitations Balance Balance Assessed: Yes Static Sitting Balance Static Sitting - Balance Support: No upper extremity supported;Feet supported Static Sitting - Level of Assistance: 5: Stand by assistance Static Standing Balance Static Standing - Balance Support: Bilateral upper extremity supported Static Standing - Level of Assistance: 3: Mod assist Static Standing - Comment/# of Minutes: Min assist progressing to moderate assistance with fatigue. ~3 min.  Exercise  General Exercises - Lower Extremity Ankle Circles/Pumps:  AROM;Both;15 reps;Seated Long Arc Quad: AROM;Both;20 reps;Seated Heel Slides: AROM;Both;20 reps;Seated Hip Flexion/Marching: AROM;10 reps;Both;Seated End of Session PT -  End of Session Equipment Utilized During Treatment: Gait belt Activity Tolerance: Patient tolerated treatment well;Patient limited by fatigue Patient left: in chair;with call bell in reach (reinforced to pt to call for assist to get up) Nurse Communication: Mobility status for transfers;Mobility status for ambulation General Behavior During Session: [facility] for tasks performed Cognition: Impaired Cognitive Impairment: Pt following commands consistently however has limited short term memory. Pt not able to remember why he is in the hospital.   Sherie Don 10/11/2011, 10:53 AM  Sherie Don) Carleene Mains PT, DPT Acute Rehabilitation [phone]

## 2011-10-11 NOTE — Progress Notes (Signed)
Checked with pt about CPAP and he said he has not been wearing one and does not want to wear one while in the hospital. There was not a CPAP machine in the room. Pt will notify RT if he changes his mind.

## 2011-10-12 LAB — BASIC METABOLIC PANEL
CO2: 33 mEq/L — ABNORMAL HIGH (ref 19–32)
Calcium: 9 mg/dL (ref 8.4–10.5)
GFR calc non Af Amer: 85 mL/min — ABNORMAL LOW (ref >90)
Glucose, Bld: 178 mg/dL — ABNORMAL HIGH (ref 70–99)
Potassium: 4.4 mEq/L (ref 3.5–5.1)
Sodium: 137 mEq/L (ref 135–145)

## 2011-10-12 LAB — CBC
Hemoglobin: 9.9 g/dL — ABNORMAL LOW (ref 13.0–17.0)
MCH: 25.7 pg — ABNORMAL LOW (ref 26.0–34.0)
MCHC: 28.9 g/dL — ABNORMAL LOW (ref 30.0–36.0)
Platelets: 163 10*3/uL (ref 150–400)
RBC: 3.85 MIL/uL — ABNORMAL LOW (ref 4.22–5.81)

## 2011-10-12 LAB — GLUCOSE, CAPILLARY
Glucose-Capillary: 236 mg/dL — ABNORMAL HIGH (ref 70–99)
Glucose-Capillary: 254 mg/dL — ABNORMAL HIGH (ref 70–99)

## 2011-10-12 LAB — CULTURE, BLOOD (ROUTINE X 2)
Culture  Setup Time: 201212260043
Culture: NO GROWTH

## 2011-10-12 NOTE — Progress Notes (Signed)
Patient ID: [name], male   DOB: [birth year], 66 y.o.   MRN: [id number] Patient ID: [name], male   DOB: November 26, 1945, 66 y.o.   MRN: [id number]  Name: [name] MRN: [id number] DOB: [birth year]    LOS: 7  Care transferred to [facility] on 10/11/2011  Subjective: Patient seen. Awake. Mildly confused. Complained about not feeling too well. Vital Sign Temp:  [97.8 F (36.6 C)-98.1 F (36.7 C)] 97.8 F (36.6 C) (01/01 0559) Pulse Rate:  [63-79] 64  (01/01 0933) Resp:  [17-20] 18  (01/01 0559) BP: (95-154)/(43-91) 95/43 mmHg (01/01 0933) SpO2:  [94 %-99 %] 94 % (01/01 1204) Weight:  [129.1 kg (284 lb 9.8 oz)] 284 lb 9.8 oz (129.1 kg) (01/01 0234)      . sodium chloride 20 mL/hr at 10/12/11 0657    Intake/Output Summary (Last 24 hours) at 10/12/11 1259 Last data filed at 10/12/11 0951  Gross per 24 hour  Intake 852.67 ml  Output   2100 ml  Net -1247.33 ml     Physical Examination: General:  Lethargic, answers questions appropriately. No acute distress  HEENT:  PERRL Neck:  Supple, no JVD   Cardiovascular:  Irregular rhythm, controlled rate, no murmurs Lungs: Decreased breath sound bibasilar secondary to body habitus Abdomen:  Obese, large non tender, organs difficult to palpate, bowel sounds present Musculoskeletal:  Chronic stasis dermatitis / bilateral pitting edema lower extremities. Skin:  Multiple abrasions, anasarca Neurology-nonfocal  Labs and Imaging:  Pcxr: 12/30 bilateral airspace disease. Improved aeration  Lab 10/12/11 0548 10/11/11 0620 10/10/11 0500  NA 137 135 139  K 4.4 4.2 4.3  CL 99 99 102  CO2 33* 30 31  BUN 32* 29* 32*  CREATININE 0.94 0.83 0.75  GLUCOSE 178* 246* 157*    Lab 10/12/11 0548 10/11/11 0620 10/10/11 0500  HGB 9.9* 10.6* 11.2*  HCT 34.3* 36.5* 38.8*  WBC 11.4* 9.9 8.0  PLT 163 169 163    Assessment and Plan:  #1 Metabolic / alcoholic encephalopathy-continue thiamine and folic acid.  #2 Acute  exacerbation of COPD-we'll continue breathing treatments.   #3 CAP  versus aspiration pneumonia-we'll repeat chest x-ray in a.m. but in the meantime we'll continue IV moxifloxacin.  #4 Non-ST Elevated MI-he continue aspirin, Zocor, and metoprolol.  #5 Acute on chronic systolic congestive heart failure- we'll continue spironolactone   #6 Atrial fibrillation with controlled rate.    #7 Hypothyroidism-continue synthroid   #8 Deconditioning-physical therapy consult    [name]

## 2011-10-12 NOTE — Progress Notes (Signed)
Pt had a 2.25 second pause on telemetry. Pt is asymptomatic.  Dr. Ludger Nutting notified, no orders given.  Will continue to monitor pt.

## 2011-10-13 ENCOUNTER — Inpatient Hospital Stay (HOSPITAL_COMMUNITY): Payer: Medicare Other

## 2011-10-13 LAB — COMPREHENSIVE METABOLIC PANEL
AST: 14 U/L (ref 0–37)
Albumin: 2.9 g/dL — ABNORMAL LOW (ref 3.5–5.2)
BUN: 32 mg/dL — ABNORMAL HIGH (ref 6–23)
Calcium: 9.1 mg/dL (ref 8.4–10.5)
Chloride: 98 mEq/L (ref 96–112)
Creatinine, Ser: 0.88 mg/dL (ref 0.50–1.35)
Total Bilirubin: 0.5 mg/dL (ref 0.3–1.2)
Total Protein: 6.8 g/dL (ref 6.0–8.3)

## 2011-10-13 LAB — CBC
HCT: 36.3 % — ABNORMAL LOW (ref 39.0–52.0)
MCH: 25.9 pg — ABNORMAL LOW (ref 26.0–34.0)
MCHC: 29.2 g/dL — ABNORMAL LOW (ref 30.0–36.0)
MCV: 88.5 fL (ref 78.0–100.0)
Platelets: 155 10*3/uL (ref 150–400)
RDW: 17.9 % — ABNORMAL HIGH (ref 11.5–15.5)
WBC: 9.7 10*3/uL (ref 4.0–10.5)

## 2011-10-13 LAB — GLUCOSE, CAPILLARY
Glucose-Capillary: 168 mg/dL — ABNORMAL HIGH (ref 70–99)
Glucose-Capillary: 177 mg/dL — ABNORMAL HIGH (ref 70–99)
Glucose-Capillary: 257 mg/dL — ABNORMAL HIGH (ref 70–99)

## 2011-10-13 LAB — PRO B NATRIURETIC PEPTIDE: Pro B Natriuretic peptide (BNP): 5170 pg/mL — ABNORMAL HIGH (ref 0–125)

## 2011-10-13 MED ORDER — ACETAMINOPHEN 325 MG PO TABS: 650.0000 mg | ORAL_TABLET | Freq: Four times a day (QID) | ORAL | Status: DC | PRN

## 2011-10-13 MED ORDER — HYDROCODONE-ACETAMINOPHEN 5-325 MG PO TABS
1.0000 | ORAL_TABLET | ORAL | Status: DC | PRN
  Administered 2011-10-13 – 2011-10-16 (×8): 1 via ORAL
  Filled 2011-10-13 (×10): qty 1

## 2011-10-13 NOTE — Progress Notes (Signed)
Physical Therapy Note: chart reviewed but pt currently off floor for xray. Will attempt as time allows or next date. Thanks Toney Sang, PT [phone]

## 2011-10-13 NOTE — Progress Notes (Signed)
Inpatient Diabetes Program Recommendations  AACE/ADA: New Consensus Statement on Inpatient Glycemic Control (2009)  Target Ranges:  Prepandial:   less than 140 mg/dL      Peak postprandial:   less than 180 mg/dL (1-2 hours)      Critically ill patients:  140 - 180 mg/dL   Reason: post-prandial hyperglycemia and fasting CBG=168  Inpatient Diabetes Program Recommendations Insulin - Basal: Please increase Lantus to 15 units (Pt wt is 129 kg (initial dosing based on 0..2 units/kg =26 units) Correction (SSI): . Insulin - Meal Coverage: Increase meal coverage to 4 HgbA1C: Please check A1C to assess pre-hospital glycemic control.

## 2011-10-13 NOTE — Progress Notes (Signed)
Patient ID: [name], male   DOB: [birth year], 66 y.o.   MRN: [id number] Patient ID: [name], male   DOB: 09-Jun-1946, 65 y.o.   MRN: [id number] Patient ID: [name], male   DOB: 10/05/1946, 66 y.o.   MRN: [id number]  Name: [name] MRN: [id number] DOB: 11-Mar-1946    LOS: 8  Care transferred to [facility] on 10/11/2011  Subjective: Patient seen. Awake. Mildly confused. Much more alert.  No complaints.  Is confused about going to rehab.  Sitter ordered for safety this morning as there was concern the patient may get up without assistance and fall.  Vital Sign Temp:  [97.4 F (36.3 C)-97.7 F (36.5 C)] 97.4 F (36.3 C) (01/02 0611) Pulse Rate:  [68] 68  (01/02 0611) Resp:  [21-22] 21  (01/02 0611) BP: (138-154)/(72-90) 154/90 mmHg (01/02 0611) SpO2:  [95 %-99 %] 99 % (01/02 1220) Weight:  [130.3 kg (287 lb 4.2 oz)] 287 lb 4.2 oz (130.3 kg) (01/02 6295)    Intake/Output Summary (Last 24 hours) at 10/13/11 1556 Last data filed at 10/13/11 1030  Gross per 24 hour  Intake    881 ml  Output    626 ml  Net    255 ml     Physical Examination: General:  Alert, no lethargy.  Confused about SNF Rehab (thinks he would go for an hour and go home).  Seems to become confused easily. Cardiovascular:  Irregular rhythm, controlled rate, no murmurs (Tele shows rate controlled afib with no alarms) Lungs: Good breath sounds, no wheeze or crackles Abdomen:  Obese, large non tender, organs difficult to palpate, bowel sounds present Musculoskeletal:  Chronic stasis dermatitis / bilateral pitting edema lower extremities. Skin:  Multiple abrasions, anasarca Neurology-nonfocal  Labs and Imaging:   CXR 10/13/11:  Mild vascular congestion is stable.   Lab 10/13/11 0552 10/12/11 0548 10/11/11 0620  NA 134* 137 135  K 4.6 4.4 4.2  CL 98 99 99  CO2 29 33* 30  BUN 32* 32* 29*  CREATININE 0.88 0.94 0.83  GLUCOSE 170* 178* 246*    Lab 10/13/11 0552 10/12/11  0548 10/11/11 0620  HGB 10.6* 9.9* 10.6*  HCT 36.3* 34.3* 36.5*  WBC 9.7 11.4* 9.9  PLT 155 163 169    Assessment and Plan:  #1 Metabolic / alcoholic encephalopathy-  Now without lethargy, but easily confused.  Question:  Is this his new baseline? continue thiamine and folic acid.  #2 Acute exacerbation of COPD-we'll continue breathing treatments.   #3 CAP  versus aspiration pneumonia- resolving nicely.  On PO Avelox.  #4 Non-ST Elevated MI - continue aspirin, Zocor, and metoprolol.  #5 Acute on chronic systolic congestive heart failure- we'll continue spironolactone  #6 Atrial fibrillation with controlled rate.    #7 Hypothyroidism-continue synthroid  #8 Disposition - Recommended SNF.  Social work involved determining placement with family.  Will discontinue sitter.  Patient confused but ready to go when appropriate bed is available.  Patient seen and evaluated. Fully reexamine. I agree with assessment and plan. [address]     [address] [place] [phone]

## 2011-10-13 NOTE — Progress Notes (Signed)
Physical Therapy Treatment Patient Details Name: [name] MRN: [id number] DOB: [birth year] Today's Date: 10/13/2011  PT Assessment/Plan  PT - Assessment/Plan Comments on Treatment Session: Pt with greatly improved mobility today. Still with impaired cognition and gait but improving. Pt encouraged to continue bil LE exercises and be OOB to chair daily.  PT Plan: Discharge plan remains appropriate;Frequency remains appropriate PT Goals  Acute Rehab PT Goals PT Goal: Sit to Supine/Side - Progress: Progressing toward goal PT Goal: Sit to Stand - Progress: Progressing toward goal PT Goal: Stand to Sit - Progress: Progressing toward goal PT Transfer Goal: Bed to Chair/Chair to Bed - Progress: Progressing toward goal PT Goal: Ambulate - Progress: Progressing toward goal PT Goal: Up/Down Stairs - Progress: Progressing toward goal  PT Treatment Precautions/Restrictions  Precautions Precautions: Fall Precaution Comments: o2 Restrictions Weight Bearing Restrictions: No Mobility (including Balance) Bed Mobility Bed Mobility: No Transfers Sit to Stand: 4: Min assist;From chair/3-in-1 Sit to Stand Details (indicate cue type and reason): Pt stood 2x from recliner and WC with cues for hand placement, anterior weight shift and cues to scoot to edge first Stand to Sit: 4: Min assist Stand to Sit Details: cueing for hand placement and control of descent to chair x2, 2nd trial much more controlled then first Ambulation/Gait Ambulation/Gait: Yes Ambulation/Gait Assistance: 4: Min assist Ambulation/Gait Assistance Details (indicate cue type and reason): cues for posture, position in RW, direction, and safety Ambulation Distance (Feet): 120 Feet, pt also performed side stepping at EOB to reposition furniture  Assistive device: Rolling walker Gait Pattern: Trunk flexed;Decreased stride length Stairs: No  Posture/Postural Control Posture/Postural Control: No significant limitations Static  Standing Balance Static Standing - Balance Support: Bilateral upper extremity supported Static Standing - Level of Assistance: 4: Min assist Exercise  General Exercises - Lower Extremity Long Arc Quad: AROM;15 reps;Both;Seated Hip Flexion/Marching: AROM;10 reps;Both;Seated End of Session PT - End of Session Equipment Utilized During Treatment: Gait belt Activity Tolerance: Patient tolerated treatment well Patient left: in chair;with call bell in reach Psychiatrist present) Nurse Communication: Mobility status for ambulation;Mobility status for transfers General Behavior During Session: [facility] for tasks performed Cognition: Impaired Cognitive Impairment: Pt oriented to self and place, kept saying he was ready to eat- had already eaten and no tray present. Pt with impaired problem solving and requires step by step instruction to complete all tasks  Delorse Lek 10/13/2011, 3:03 PM Toney Sang, PT [phone]

## 2011-10-13 NOTE — Progress Notes (Signed)
Clinical Social Worker completed psychosocial assessment, please see shadow chart for details.    Louie Flenner, MSW, LCSWA [phone] 

## 2011-10-14 MED ORDER — LORAZEPAM 0.5 MG PO TABS
0.5000 mg | ORAL_TABLET | Freq: Three times a day (TID) | ORAL | Status: DC | PRN
  Administered 2011-10-14 – 2011-10-15 (×4): 1 mg via ORAL
  Filled 2011-10-14 (×3): qty 2
  Filled 2011-10-14: qty 1

## 2011-10-14 NOTE — Progress Notes (Signed)
Clinical Social Worker met at [employer] with pt, pt daughter, and pt sister-in-law at bedside. CM present at this time. Clinical Child psychotherapist and CM discussed pt discharge plans and Visual merchandiser provided pt and pt family bed offers for [facility]. Pt and pt daughter interested in pt being evaluated for CIR and agreeable to a SNF search in [facility]. Clinical Social Worker provided emotional support to pt daughter as she discussed the difficulty in discussing discharge planning because she wants to ensure that the pt knows that the rehabilitation is for a short term. Pt and pt daughter prefer pt to go to CIR, but agreeable to discussing SNF option as another option. Clinical Social Worker initiated SNF search in [place] and will follow up with bed offers. Clinical Social Worker will continue to follow to provide support and assist with discharge planning. Clinical Social Worker will follow up after CIR consult and facilitate pt discharge needs when pt medically ready for discharge.   Jacklynn Lewis, MSW, LCSWA  Clinical Social Work [phone]

## 2011-10-14 NOTE — Progress Notes (Addendum)
Subjective: Chart reviewed. Patient denies complaints. He says he feels anxious and wishes to go home. According to nurses patient has been confused and according to his daughter patient has been talking "off the wall" at times. This mental status apparently is new for him during this hospitalization.  Objective: Blood pressure 122/75, pulse 63, temperature 97.7 F (36.5 C), temperature source Oral, resp. rate 22, height 5\' 10"  (1.778 m), weight 130.2 kg (287 lb 0.6 oz), SpO2 97.00%.  Intake/Output Summary (Last 24 hours) at 10/14/11 1808 Last data filed at 10/14/11 1426  Gross per 24 hour  Intake    960 ml  Output    800 ml  Net    160 ml    General Exam: Comfortable. Sitting up in chair and is in no distress. Respiratory System: Clear. No increased work of breathing. Cardiovascular System: First and second heart sounds heard. Regular rate and rhythm. No JVD/murmurs. Gastrointestinal System: Abdomen is non distended, soft and normal bowel sounds heard. Central Nervous System: Alert and oriented self, place but not to time,. No focal neurological deficits.  Lab Results: Basic Metabolic Panel:  Basename 10/13/11 0552 10/12/11 0548  NA 134* 137  K 4.6 4.4  CL 98 99  CO2 29 33*  GLUCOSE 170* 178*  BUN 32* 32*  CREATININE 0.88 0.94  CALCIUM 9.1 9.0  MG -- --  PHOS -- --   Liver Function Tests:  [facility] 10/13/11 0552  AST 14  ALT 19  ALKPHOS 53  BILITOT 0.5  PROT 6.8  ALBUMIN 2.9*   No results found for this basename: LIPASE:2,AMYLASE:2 in the last 72 hours No results found for this basename: AMMONIA:2 in the last 72 hours CBC:  Basename 10/13/11 0552 10/12/11 0548  WBC 9.7 11.4*  NEUTROABS -- --  HGB 10.6* 9.9*  HCT 36.3* 34.3*  MCV 88.5 89.1  PLT 155 163   BNP:  Basename 10/13/11 0552  PROBNP 5170.0*   CBG:  Basename 10/14/11 1145 10/14/11 0145 10/13/11 2149 10/13/11 1732 10/13/11 1218 10/13/11 0738  GLUCAP 175* 239* 277* 257* 177* 168*      Micro  Results: Recent Results (from the past 240 hour(s))  MRSA PCR SCREENING     Status: Normal   Collection Time   10/05/11  7:36 PM      Component Value Range Status Comment   MRSA by PCR NEGATIVE  NEGATIVE  Final   CULTURE, BLOOD (ROUTINE X 2)     Status: Normal   Collection Time   10/05/11  8:14 PM      Component Value Range Status Comment   Specimen Description BLOOD RIGHT HAND   Final    Special Requests BOTTLES DRAWN AEROBIC AND ANAEROBIC 10CC   Final    Setup Time [id number]   Final    Culture NO GROWTH 5 DAYS   Final    Report Status 10/12/2011 FINAL   Final   CULTURE, BLOOD (ROUTINE X 2)     Status: Normal   Collection Time   10/05/11  8:15 PM      Component Value Range Status Comment   Specimen Description BLOOD LEFT HAND   Final    Special Requests BOTTLES DRAWN AEROBIC AND ANAEROBIC 10CC   Final    Setup Time [id number]   Final    Culture NO GROWTH 5 DAYS   Final    Report Status 10/12/2011 FINAL   Final   URINE CULTURE     Status: Normal   Collection Time  10/05/11  8:44 PM      Component Value Range Status Comment   Specimen Description URINE, CATHETERIZED   Final    Special Requests NONE   Final    Setup Time [id number]   Final    Colony Count NO GROWTH   Final    Culture NO GROWTH   Final    Report Status 10/07/2011 FINAL   Final     Studies/Results: Dg Chest 1 View  10/13/2011  *RADIOLOGY REPORT*  Clinical Data: Pneumonia  CHEST - 1 VIEW  Comparison: 10/10/2011  Findings: Small left pleural effusion.  Stable vascular congestion. Low volumes.  Left basilar atelectasis.  Mild cardiomegaly.  IMPRESSION: Mild vascular congestion is stable.  Small left pleural effusion and basilar atelectasis.  Original Report Authenticated By: Donavan Burnet, M.D.   Dg Chest Port 1 View  10/10/2011  *RADIOLOGY REPORT*  Clinical Data: Pulmonary edema.  Shortness of breath.  PORTABLE CHEST - 1 VIEW  Comparison: 10/08/2011  Findings: The pulmonary edema seen on the prior study  has almost completely resolved.  Heart size and vascularity are normal.  No effusions.  IMPRESSION: Almost complete resolution of the bilateral pulmonary edema.  Original Report Authenticated By: Gwynn Burly, M.D.   Dg Chest Port 1 View  10/08/2011  *RADIOLOGY REPORT*  Clinical Data: Pneumonia  PORTABLE CHEST - 1 VIEW  Comparison: Yesterday  Findings: Low volumes.  Diffuse edema.  No pneumothorax.  Mild cardiomegaly.  IMPRESSION: Stable edema.  Original Report Authenticated By: Donavan Burnet, M.D.   Dg Chest Port 1 View  10/07/2011  *RADIOLOGY REPORT*  Clinical Data: Endotracheal tube, shortness of breath.  PORTABLE CHEST - 1 VIEW  Comparison: 10/06/2011.  Findings: Trachea is midline.  Heart size stable.  Lungs are somewhat low in volume with diffuse bilateral air space disease. Focality in the right perihilar region has dissipated somewhat.  No definite pleural fluid.  IMPRESSION: Diffuse bilateral air space disease with slight improvement in the right perihilar consolidation.  Original Report Authenticated By: Reyes Ivan, M.D.   Dg Chest Port 1 View  10/06/2011  *RADIOLOGY REPORT*  Clinical Data: Intubated patient.  Short of breath.  PORTABLE CHEST - 1 VIEW  Comparison: 10/05/2011.  Findings: Improved aeration of the right upper lobe with persistent airspace disease respecting the major fissure.  Cardiomegaly. 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 Monitoring leads are projected over the chest.  The left costophrenic angle is excluded from view.  IMPRESSION: Improving aeration with persistent right lung airspace disease, more focal in the right upper lobe.  Original Report Authenticated By: Andreas Newport, M.D.    Medications: Scheduled Meds:   . ipratropium  0.5 mg Nebulization QID   And  . albuterol  2.5 mg Nebulization QID  . allopurinol  300 mg Oral Daily  . aspirin  325 mg Oral Daily  . FLUoxetine  20 mg Oral Daily  . fluticasone  1 puff Inhalation BID  .  folic acid  1 mg Oral Daily  . heparin subcutaneous  5,000 Units Subcutaneous Q8H  . insulin aspart  0-15 Units Subcutaneous TID WC  . insulin aspart  0-5 Units Subcutaneous QHS  . insulin aspart  3 Units Subcutaneous TID WC  . insulin glargine  5 Units Subcutaneous QHS  . levothyroxine  150 mcg Oral Q breakfast  . metoprolol  75 mg Oral BID  . moxifloxacin  400 mg Oral q1800  . pantoprazole  40 mg Oral Q1200  .  predniSONE  20 mg Oral Q breakfast  . simvastatin  40 mg Oral QHS  . spironolactone  50 mg Oral Daily   Continuous Infusions:  PRN Meds:.acetaminophen, albuterol, HYDROcodone-acetaminophen  Assessment/Plan: 1. Altered mental status, secondary to metabolic/alcoholic encephalopathy: Although mental status may be better compared to that on admission, patient is still confused and does not seem to have the capacity to make a decision regarding discharge environment. Discussed at length with patient's daughter Ms. Merlyn Lot who says that she will try and talk to patient to see if he will continue inpatient care. ? Element of dementia. 2. COPD: Stable need to rule out obstructive sleep apnea by outpatient sleep study. 3. Community acquired pneumonia versus aspiration pneumonia. Continue Avelox 4. Atrial fibrillation: Controlled ventricular rate. Not a long-term Coumadin candidate secondary to history of alcohol abuse 5. Acute on chronic diastolic congestive heart failure: Compensated 6. Deconditioning: Requested CIR input. 7. Anemia: Stable  Discussed patient's care at length with his daughter Ms. Merlyn Lot.   HONGALGI,ANAND 10/14/2011, 6:08 PM

## 2011-10-14 NOTE — Progress Notes (Signed)
Pt is confused and is not oriented to time and place.

## 2011-10-14 NOTE — Consult Note (Signed)
Physical Medicine and Rehabilitation Consult Reason for Consult: Encephalopathy/deconditioning Referring Phsyician: [facility] hospitalist [name] is an 66 y.o. male.   HPI: 66 year old right-handed white male with chronic obstructive pulmonary disease as well as coronary artery disease brought from [facility] December 25 with altered mental status and respiratory difficulties. Reports drinking 4 bottles of hard liquor the night before. Chest x-ray revealed right lower lobe infiltrate and placed on Rocephin. Altered mental status felt to be alcohol-induced placed on thiamine and folic acid. Noted mild elevation in troponin and EKG without ischemic changes. Cardiology was consulted suspect NSTEMI and advised medical management with aspirin and beta blocker. Echocardiogram with ejection fraction of 50% and normal wall thickness. Not felt to be a Coumadin candidate due to history of alcohol abuse. Mental status continued to improve for close monitoring for alcohol withdrawal. Latest physical therapy evaluation January 3 for deconditioning as patient was minimal assist to ambulate 140 feet with a rolling walker with verbal cues and minimal assist for sit to stand as well as stand to sit. Physical medicine and rehabilitation was consulted at request of physical therapy to consider inpatient rehabilitation services  Review of Systems  Constitutional: Positive for malaise/fatigue.  Eyes: Negative for double vision.  Respiratory: Positive for shortness of breath. Negative for cough.   Cardiovascular: Negative for chest pain.  Gastrointestinal: Positive for constipation. Negative for nausea.  Genitourinary: Negative for dysuria.  Musculoskeletal: Positive for myalgias.  Skin: Negative.   Neurological: Negative for dizziness and headaches.  Psychiatric/Behavioral: Negative.    Past Medical History  Diagnosis Date  . CHRONIC OBSTRUCTIVE PULMONARY DISEASE 06/20/2009  . OBSTRUCTIVE SLEEP APNEA  06/20/2009  . CAROTID STENOSIS 06/20/2009    A. 08/2001 s/p L CEA;  B.   09/14/11 - Carotid U/S - 40-59% bilateral stenosis, left CEA patch angioplasty is patent  . DM 06/20/2009  . CAD 06/20/2009    A.  08/2000 - s/p CABG x 4 - LIMA-LAD, Left Radial-OM, VG-DIAG, VG-RCA;  B. Neg. MV  2010  . HYPERLIPIDEMIA 06/20/2009  . HYPERTENSION 06/20/2009  . Overweight   . Hypothyroidism   . Low back pain   . Asthma     as child  . Pneumonia   . Campath-induced atrial fibrillation     Not felt to be coumadin candidate 2/2 ETOH use.  Marland Kitchen Hypothyroidism   . ETOH abuse   . History of tobacco abuse     remote - quit 1970   Past Surgical History  Procedure Date  . Carotid endarterectomy 2002    left  . Coronary artery bypass graft     x 4 - 2001   Family History  Problem Relation Age of Onset  . Stroke Mother     ?  . Stroke Father     ?   Social History:  reports that he quit smoking about 43 years ago. He does not have any smokeless tobacco history on file. He reports that he drinks alcohol. He reports that he uses illicit drugs (Marijuana). Allergies:  Allergies  Allergen Reactions  . Erythromycin     Took as a child for tooth ache and unable to recall reaction   Medications Prior to Admission  Medication Dose Route Frequency Provider Last Rate Last Dose  . acetaminophen (TYLENOL) tablet 650 mg  650 mg Oral Q6H PRN Stephani Police, PA      . ipratropium (ATROVENT) nebulizer solution 0.5 mg  0.5 mg Nebulization QID Leslye Peer, MD  0.5 mg at 10/14/11 1212   And  . albuterol (PROVENTIL) (5 MG/ML) 0.5% nebulizer solution 2.5 mg  2.5 mg Nebulization QID Leslye Peer, MD   2.5 mg at 10/14/11 1212  . albuterol (PROVENTIL) (5 MG/ML) 0.5% nebulizer solution 2.5 mg  2.5 mg Nebulization Q4H PRN Leslye Peer, MD      . allopurinol (ZYLOPRIM) tablet 300 mg  300 mg Oral Daily Anders Simmonds, NP   300 mg at 10/14/11 1025  . aspirin tablet 325 mg  325 mg Oral Daily Leslye Peer, MD   325 mg  at 10/14/11 1026  . FLUoxetine (PROZAC) capsule 20 mg  20 mg Oral Daily Lonia Farber, MD   20 mg at 10/14/11 1026  . fluticasone (FLOVENT HFA) 220 MCG/ACT inhaler 1 puff  1 puff Inhalation BID Lonia Farber, MD   1 puff at 10/14/11 0912  . folic acid (FOLVITE) tablet 1 mg  1 mg Oral Daily Kendra P Hiatt, PHARMD   1 mg at 10/14/11 1026  . furosemide (LASIX) 10 MG/ML injection           . furosemide (LASIX) injection 40 mg  40 mg Intravenous BID Lonia Farber, MD   40 mg at 10/07/11 0749  . heparin bolus via infusion 3,000 Units  3,000 Units Intravenous Once Colleen Can, PHARMD   3,000 Units at 10/06/11 4098  . heparin bolus via infusion 3,000 Units  3,000 Units Intravenous Once [employer], PHARMD   3,000 Units at 10/06/11 1600  . heparin injection 5,000 Units  5,000 Units Subcutaneous Q8H Leslye Peer, MD   5,000 Units at 10/14/11 0551  . HYDROcodone-acetaminophen (NORCO) 5-325 MG per tablet 1 tablet  1 tablet Oral Q4H PRN Stephani Police, PA   1 tablet at 10/14/11 0618  . insulin aspart (novoLOG) injection 0-15 Units  0-15 Units Subcutaneous TID WC Anders Simmonds, NP   3 Units at 10/14/11 1211  . insulin aspart (novoLOG) injection 0-5 Units  0-5 Units Subcutaneous QHS Anders Simmonds, NP   3 Units at 10/13/11 2257  . insulin aspart (novoLOG) injection 3 Units  3 Units Subcutaneous TID WC Anders Simmonds, NP   3 Units at 10/14/11 1212  . insulin glargine (LANTUS) injection 5 Units  5 Units Subcutaneous QHS Anders Simmonds, NP   5 Units at 10/13/11 2256  . levothyroxine (SYNTHROID, LEVOTHROID) tablet 150 mcg  150 mcg Oral Q breakfast Lonia Farber, MD   150 mcg at 10/14/11 0849  . LORazepam (ATIVAN) 2 MG/ML injection        2 mg at 10/06/11 1700  . metoprolol tartrate (LOPRESSOR) tablet 75 mg  75 mg Oral BID Corky Crafts, MD   75 mg at 10/14/11 1025  . moxifloxacin (AVELOX) tablet 400 mg  400 mg Oral q1800 Mickeal Skinner,  PHARMD   400 mg at 10/13/11 1744  . pantoprazole (PROTONIX) EC tablet 40 mg  40 mg Oral Q1200 Lonia Farber, MD   40 mg at 10/14/11 1211  . pneumococcal 13-valent conjugate vaccine (PREVNAR 13) injection 0.5 mL  0.5 mL Intramuscular Tomorrow-1000 Konstantin Zubelevitskiy, MD   0.5 mL at 10/07/11 1006  . predniSONE (DELTASONE) tablet 20 mg  20 mg Oral Q breakfast Leslye Peer, MD   20 mg at 10/14/11 0849  . simvastatin (ZOCOR) tablet 40 mg  40 mg Oral QHS Lonia Farber, MD   40 mg at 10/13/11 2256  . sodium chloride 0.9 % injection  3 mL at 10/05/11 2022  . sodium chloride 0.9 % injection        10 mL at 10/07/11 0050  . sodium chloride 0.9 % injection        10 mL at 10/07/11 0050  . sodium chloride 0.9 % injection        10 mL at 10/07/11 0444  . sodium chloride 0.9 % injection           . spironolactone (ALDACTONE) tablet 50 mg  50 mg Oral Daily Leslye Peer, MD   50 mg at 10/14/11 1026  . DISCONTD: 0.9 %  sodium chloride infusion   Intravenous Continuous Lonia Farber, MD      . DISCONTD: 0.9 %  sodium chloride infusion   Intravenous Continuous Lonia Farber, MD 20 mL/hr at 10/13/11 0600    . DISCONTD: albuterol (PROVENTIL) (5 MG/ML) 0.5% nebulizer solution 2.5 mg  2.5 mg Nebulization Q4H Lonia Farber, MD   2.5 mg at 10/08/11 0745  . DISCONTD: albuterol (PROVENTIL) (5 MG/ML) 0.5% nebulizer solution 2.5 mg  2.5 mg Nebulization Q2H PRN Lonia Farber, MD      . DISCONTD: allopurinol (ZYLOPRIM) tablet 300 mg  300 mg Oral Daily Lonia Farber, MD      . DISCONTD: antiseptic oral rinse (BIOTENE) solution 15 mL  15 mL Mouth Rinse q12n4p Konstantin Zubelevitskiy, MD   15 mL at 10/10/11 1154  . DISCONTD: aspirin EC tablet 325 mg  325 mg Oral Daily Marcelino Scot, PHARMD   325 mg at 10/06/11 0944  . DISCONTD: aspirin tablet 325 mg  325 mg Oral Daily Lonia Farber, MD      . DISCONTD: cefTRIAXone  (ROCEPHIN) 2 g in dextrose 5 % 50 mL IVPB  2 g Intravenous Q24H Lonia Farber, MD   2 g at 10/07/11 2014  . DISCONTD: chlorhexidine (PERIDEX) 0.12 % solution 15 mL  15 mL Mouth/Throat BID Lonia Farber, MD   15 mL at 10/10/11 0754  . DISCONTD: folic acid injection 1 mg  1 mg Intravenous Daily Lonia Farber, MD   1 mg at 10/08/11 1001  . DISCONTD: heparin ADULT infusion 100 units/ml (25000 units/250 ml)  2,200 Units/hr Intravenous Continuous [address], [place] 22 mL/hr at 10/08/11 0220 2,200 Units/hr at 10/08/11 0220  . DISCONTD: HYDROcodone-acetaminophen (NORCO) 10-325 MG per tablet 1 tablet  1 tablet Oral Q6H PRN Anders Simmonds, NP   1 tablet at 10/10/11 2057  . DISCONTD: HYDROcodone-acetaminophen (NORCO) 10-325 MG per tablet 2 tablet  2 tablet Oral Q6H PRN Shan Levans, MD   2 tablet at 10/13/11 0901  . DISCONTD: insulin aspart (novoLOG) injection 0-15 Units  0-15 Units Subcutaneous Q4H Lonia Farber, MD   11 Units at 10/10/11 1602  . DISCONTD: ipratropium (ATROVENT) nebulizer solution 0.5 mg  0.5 mg Nebulization Q4H Lonia Farber, MD   0.5 mg at 10/08/11 0745  . DISCONTD: ipratropium (ATROVENT) nebulizer solution 0.5 mg  0.5 mg Nebulization Q2H PRN Lonia Farber, MD      . DISCONTD: LORazepam (ATIVAN) injection 0.5 mg  0.5 mg Intravenous Q6H Leslye Peer, MD      . DISCONTD: LORazepam (ATIVAN) injection 0.5-1 mg  0.5-1 mg Intravenous Q4H PRN Lonia Farber, MD   1 mg at 10/09/11 0235  . DISCONTD: LORazepam (ATIVAN) injection 1-4 mg  1-4 mg Intravenous Q4H PRN Lonia Farber, MD   1 mg at 10/07/11 0445  . DISCONTD: LORazepam (ATIVAN) injection 2 mg  2 mg Intravenous Q30  min PRN Coralyn Helling, MD   2 mg at 10/06/11 1800  . DISCONTD: metoprolol (LOPRESSOR) injection 5 mg  5 mg Intravenous Q6H Coralyn Helling, MD   5 mg at 10/07/11 0606  . DISCONTD: metoprolol (LOPRESSOR) tablet 50 mg  50 mg Oral BID  Lonia Farber, MD   50 mg at 10/06/11 0944  . DISCONTD: metoprolol (LOPRESSOR) tablet 50 mg  50 mg Oral BID Leslye Peer, MD   50 mg at 10/09/11 1040  . DISCONTD: morphine 2 MG/ML injection 2 mg  2 mg Intravenous Q4H PRN Coralyn Helling, MD   2 mg at 10/10/11 1109  . DISCONTD: moxifloxacin (AVELOX) IVPB 400 mg  400 mg Intravenous Q24H Lonia Farber, MD   400 mg at 10/07/11 2109  . DISCONTD: potassium chloride SA (K-DUR,KLOR-CON) 20 MEQ CR tablet           . DISCONTD: predniSONE (DELTASONE) tablet 40 mg  40 mg Oral Q breakfast Lonia Farber, MD      . DISCONTD: predniSONE (DELTASONE) tablet 40 mg  40 mg Oral Q breakfast Lonia Farber, MD   40 mg at 10/07/11 0746  . DISCONTD: thiamine (B-1) injection 100 mg  100 mg Intravenous Daily Lonia Farber, MD   100 mg at 10/07/11 1006   Medications Prior to Admission  Medication Sig Dispense Refill  . allopurinol (ZYLOPRIM) 300 MG tablet Take 300 mg by mouth daily.        Marland Kitchen aspirin 325 MG tablet Take 325 mg by mouth daily.        Marland Kitchen FLUoxetine (PROZAC) 20 MG capsule Take 20 mg by mouth daily.        . fluticasone (FLOVENT HFA) 220 MCG/ACT inhaler Inhale 1 puff into the lungs 2 (two) times daily.        Marland Kitchen levothyroxine (SYNTHROID, LEVOTHROID) 150 MCG tablet Take 150 mcg by mouth daily.        . magnesium oxide (MAG-OX) 400 MG tablet Take 400 mg by mouth daily.        . metoprolol (LOPRESSOR) 50 MG tablet Take 50 mg by mouth 2 (two) times daily.        Marland Kitchen omeprazole (PRILOSEC) 20 MG capsule Take 20 mg by mouth daily.        . potassium chloride SA (K-DUR,KLOR-CON) 20 MEQ tablet Take 20 mEq by mouth daily.        . simvastatin (ZOCOR) 40 MG tablet Take 40 mg by mouth at bedtime.        Marland Kitchen spironolactone (ALDACTONE) 50 MG tablet Take 50 mg by mouth daily.          Home: Home Living Lives With: Daughter Receives Help From: Family Type of Home: House Home Layout: One level Home Access: Stairs to  enter Entrance Stairs-Rails: Doctor, general practice of Steps: 3 Bathroom Shower/Tub: Health visitor: Standard Home Adaptive Equipment: Environmental consultant - rolling;Straight cane Additional Comments: Unclear whether pt is an accurate historian.  Functional History: Prior Function Level of Independence: Requires assistive device for independence;Needs assistance with homemaking Able to Take Stairs?: Yes Driving: Yes Functional Status:  Mobility: Bed Mobility Bed Mobility: Yes Rolling Left: 5: Supervision Rolling Left Details (indicate cue type and reason): Max verbal and visual cues for sequence secondary to confusion. Pt distracted by trying to find then use cell phone.  Left Sidelying to Sit: 5: Supervision Left Sidelying to Sit Details (indicate cue type and reason): Verbal cues secondary to confusion, no assistance required today.  Supine to Sit: 1: +2 Total assist;Patient percentage (comment);HOB elevated (Comment degrees) (HOB elevated to near-sitting; pt = 65%) Supine to Sit Details (indicate cue type and reason): verbal and tactile cues for technique, and to initiate and attend to task Transfers Transfers: Yes Sit to Stand: 4: Min assist;From bed;With upper extremity assist Sit to Stand Details (indicate cue type and reason): Multiple attempts required. Verbal cues for anterior weight shift.  Stand to Sit: 4: Min assist;To chair/3-in-1;With armrests Stand to Sit Details: Verbal/tactile cues to place UEs on armrests, control of descent. Pt tends to "plop" into chair indicating LE weakness.  Ambulation/Gait Ambulation/Gait: Yes Ambulation/Gait Assistance: 4: Min assist Ambulation/Gait Assistance Details (indicate cue type and reason): Verbal cues for negotiation of obstacles with RW, safety, and posture.  Ambulation Distance (Feet): 140 Feet Assistive device: Rolling walker Gait Pattern: Trunk flexed;Decreased stride length;Decreased dorsiflexion - left;Decreased  dorsiflexion - right Stairs: No    ADL: ADL Eating/Feeding: Performed;Set up Eating/Feeding Details (indicate cue type and reason): pt limited by lethargy, repeated fell asleep Where Assessed - Eating/Feeding: Edge of bed Grooming: Performed;Wash/dry face;Set up (declined oral care) Grooming Details (indicate cue type and reason): limited by lethargy Where Assessed - Grooming: Sitting, bed Upper Body Bathing: Simulated;Maximal assistance Where Assessed - Upper Body Bathing: Sitting, chair Lower Body Bathing: Maximal assistance;Simulated Where Assessed - Lower Body Bathing: Sitting, chair;Sit to stand from chair Upper Body Dressing: Performed;Moderate assistance Where Assessed - Upper Body Dressing: Sitting, chair Lower Body Dressing: +1 Total assistance;Performed Where Assessed - Lower Body Dressing: Sitting, chair;Sit to stand from chair Toilet Transfer: Other (comment) (deferred due to LE buckling per PT note in absence of second) Equipment Used: Rolling walker ADL Comments: Pt experiencing SOB limiting activity.  RT notified.  Cognition: Cognition Arousal/Alertness: Lethargic Orientation Level: Oriented to person;Disoriented to place;Disoriented to time;Disoriented to situation Cognition Arousal/Alertness: Lethargic Overall Cognitive Status: Impaired Attention: Impaired Current Attention Level: Focused Orientation Level: Oriented to person;Disoriented to place;Disoriented to time;Disoriented to situation Safety/Judgement: Decreased awareness of safety precautions;Decreased safety judgement for tasks assessed Decreased Safety/Judgement: Impulsive;Decreased awareness of need for assistance Safety/Judgement - Other Comments: Pt consistently asking to walk around the room, however difficulty managing pivot steps to recliner Awareness of Deficits: Decreased awareness of deficits Cognition - Other Comments: per RN, pt's cognition is improved compared to yesterday; pt required cues  like "there are doctors and nurses in this building" to identify that he's in the hospital  Blood pressure 147/89, pulse 71, temperature 98.2 F (36.8 C), temperature source Oral, resp. rate 22, height 5\' 10"  (1.778 m), weight 130.2 kg (287 lb 0.6 oz), SpO2 100.00%. Physical Exam  Constitutional: He appears well-developed.  Neck: Neck supple. No thyromegaly present.  Cardiovascular:       Irregular regular  Pulmonary/Chest: Breath sounds normal. He has no wheezes.  Abdominal: He exhibits no distension. There is no tenderness.  Musculoskeletal: He exhibits no edema.  Neurological: He is alert. He has normal reflexes.       Limited awareness needs some cues to situation.  Difficult to arouse.  Poor insight. Generalize weakness proximal greater than distal  Skin: Skin is warm and dry.  Psychiatric:       Patient was anxious to make good eye contact with examiner    Results for orders placed during the hospital encounter of 10/05/11 (from the past 24 hour(s))  GLUCOSE, CAPILLARY     Status: Abnormal   Collection Time   10/13/11  5:32 PM  Component Value Range   Glucose-Capillary 257 (*) 70 - 99 (mg/dL)  GLUCOSE, CAPILLARY     Status: Abnormal   Collection Time   10/13/11  9:49 PM      Component Value Range   Glucose-Capillary 277 (*) 70 - 99 (mg/dL)   Comment 1 Notify RN     Comment 2 Documented in Chart    GLUCOSE, CAPILLARY     Status: Abnormal   Collection Time   10/14/11  1:45 AM      Component Value Range   Glucose-Capillary 239 (*) 70 - 99 (mg/dL)  GLUCOSE, CAPILLARY     Status: Abnormal   Collection Time   10/14/11 11:45 AM      Component Value Range   Glucose-Capillary 175 (*) 70 - 99 (mg/dL)   Dg Chest 1 View  10/16/1094  *RADIOLOGY REPORT*  Clinical Data: Pneumonia  CHEST - 1 VIEW  Comparison: 10/10/2011  Findings: Small left pleural effusion.  Stable vascular congestion. Low volumes.  Left basilar atelectasis.  Mild cardiomegaly.  IMPRESSION: Mild vascular congestion is  stable.  Small left pleural effusion and basilar atelectasis.  Original Report Authenticated By: Donavan Burnet, M.D.    Assessment/Plan: Diagnosis: ETOH encephalopathy 1. Does the need for close, 24 hr/day medical supervision in concert with the patient's rehab needs make it unreasonable for this patient to be served in a less intensive setting? No 2. Co-Morbidities requiring supervision/potential complications: DM, HTN, CAD 3. Due to bladder management and bowel management, does the patient require 24 hr/day rehab nursing? No 4. Does the patient require coordinated care of a physician, rehab nurse, PT,OT,SLP to address physical and functional deficits in the context of the above medical diagnosis(es)? No Addressing deficits in the following areas: not applicable 5. Can the patient actively participate in an intensive therapy program of at least 3 hrs of therapy per day at least 5 days per week? Potentially 6. The potential for patient to make measurable gains while on inpatient rehab is fair and poor 7. Anticipated functional outcomes upon discharge from inpatients are not applicable 8. Estimated rehab length of stay to reach the above functional goals is: not applicable 9. Does the patient have adequate social supports to accommodate these discharge functional goals? Potentially 10. Anticipated D/C setting: not applicable 11. Anticipated post D/C treatments: HH therapy 12. Overall Rehab/Functional Prognosis: fair  RECOMMENDATIONS: This patient's condition is appropriate for continued rehabilitative care in the following setting: SNF Patient has agreed to participate in recommended program. Potentially Note that insurance prior authorization may be required for reimbursement for recommended care.  Comment: Given clinical exam, social hx, and functional hx, I cannot justify a CIR admit.  He will not reach an independent level from CIR, and he's already functioning at a minimal assistance  level with mobility as it stands.   ZTS 10/14/2011

## 2011-10-14 NOTE — Progress Notes (Signed)
Physical Therapy Treatment Patient Details Name: [name] MRN: [id number] DOB: Oct 09, 1946 Today's Date: 10/14/2011  PT Assessment/Plan  PT - Assessment/Plan Comments on Treatment Session: Pt continuing to make improvements however cognition is still limiting factor. PT discussed need for SNF again with pt however retention is not expected.  PT Plan: Discharge plan remains appropriate;Frequency remains appropriate PT Frequency: Min 3X/week Follow Up Recommendations: SNF; 24 hour supervision/assistance Equipment Recommended: Defer to next venue PT Goals  Acute Rehab PT Goals Pt will go Supine/Side to Sit: with supervision PT Goal: Supine/Side to Sit - Progress: Met (progress next session) Pt will go Sit to Supine/Side: with supervision PT Goal: Sit to Supine/Side - Progress: Progressing toward goal Pt will go Sit to Stand: with supervision PT Goal: Sit to Stand - Progress: Progressing toward goal Pt will go Stand to Sit: with supervision PT Goal: Stand to Sit - Progress: Progressing toward goal Pt will Transfer Bed to Chair/Chair to Bed: with supervision PT Transfer Goal: Bed to Chair/Chair to Bed - Progress: Progressing toward goal Pt will Ambulate: >150 feet;with supervision;with rolling walker PT Goal: Ambulate - Progress: Progressing toward goal Pt will Go Up / Down Stairs: 3-5 stairs;with supervision PT Goal: Up/Down Stairs - Progress: Progressing toward goal  PT Treatment Precautions/Restrictions  Precautions Precautions: Fall Precaution Comments: Secondary to weakness.  Restrictions Weight Bearing Restrictions: No Mobility (including Balance) Bed Mobility Bed Mobility: Yes Rolling Left: 5: Supervision Rolling Left Details (indicate cue type and reason): Max verbal and visual cues for sequence secondary to confusion. Pt distracted by trying to find then use cell phone.  Left Sidelying to Sit: 5: Supervision Left Sidelying to Sit Details (indicate cue type and  reason): Verbal cues secondary to confusion, no assistance required today.  Transfers Transfers: Yes Sit to Stand: 4: Min assist;From bed;With upper extremity assist Sit to Stand Details (indicate cue type and reason): Multiple attempts required. Verbal cues for anterior weight shift.  Stand to Sit: 4: Min assist;To chair/3-in-1;With armrests Stand to Sit Details: Verbal/tactile cues to place UEs on armrests, control of descent. Pt tends to "plop" into chair indicating LE weakness.  Ambulation/Gait Ambulation/Gait: Yes Ambulation/Gait Assistance: 4: Min assist Ambulation/Gait Assistance Details (indicate cue type and reason): Verbal cues for negotiation of obstacles with RW, safety, and posture.  Ambulation Distance (Feet): 140 Feet Assistive device: Rolling walker Gait Pattern: Trunk flexed;Decreased stride length;Decreased dorsiflexion - left;Decreased dorsiflexion - right    Exercise  General Exercises - Lower Extremity Ankle Circles/Pumps: AROM;Both;15 reps;Seated Long Arc Quad: AROM;Both;15 reps;Seated (manually resisted. ) Heel Slides: AROM;Both;15 reps;Seated (manually resisted) Hip Flexion/Marching: AROM;Both;10 reps;Seated Heel Raises: AROM;Both;10 reps;Seated End of Session General Behavior During Session: Other (comment) (pt appearing anxious, confused) Cognition: Impaired Cognitive Impairment: Pt oriented to self, not place, time, or situation.  No memory of events of past few days.  Sherie Don 10/14/2011, 12:22 PM  Sherie Don) Carleene Mains PT, DPT Acute Rehabilitation [phone]

## 2011-10-14 NOTE — Progress Notes (Signed)
Occupational Therapy Evaluation Patient Details Name: [name] MRN: [id number] DOB: [birth year] Today's Date: 10/14/2011  OT Assessment/Plan OT Assessment/Plan Comments on Treatment Session: Pt is progressing in ADL and mobility.  Remains confused with perceptual issues and decreased problem solving, but much more alert. OT Plan: Discharge plan remains appropriate OT Frequency: Min 1X/week Follow Up Recommendations: Skilled nursing facility Equipment Recommended: Defer to next venue OT Goals Acute Rehab OT Goals OT Goal Formulation: Patient unable to participate in goal setting Time For Goal Achievement: 2 weeks ADL Goals Pt Will Perform Eating: with set-up;Sitting, chair ADL Goal: Eating - Progress: Met Pt Will Perform Grooming: with set-up;Sitting, edge of bed ADL Goal: Grooming - Progress: Progressing toward goals Miscellaneous OT Goals Miscellaneous OT Goal #1: Pt will remain alert for ADL x 10 minutes for ADL. OT Goal: Miscellaneous Goal #1 - Progress: Progressing toward goals  OT Treatment Precautions/Restrictions  Precautions Precautions: Fall Precaution Comments: 4L 02 Restrictions Weight Bearing Restrictions: No   ADL ADL Eating/Feeding: Performed;Set up Where Assessed - Eating/Feeding: Edge of bed Grooming: Performed;Wash/dry face;Set up (declined oral care) Where Assessed - Grooming: Sitting, bed Equipment Used: Rolling walker ADL Comments: Pt experiencing SOB limiting activity.  RT notified. Mobility  Bed Mobility Bed Mobility: Yes (sit to supine with min assist for LEs, scoot up in bed with ) Transfers Transfers: No (stood, took 2 steps to [facility], declined up in chair) Sit to Stand: 4: Min assist;From bed;With upper extremity assist Stand to Sit: 4: Min assist;To bed;Without upper extremity assist Stand to Sit Details: cues for hand placement  End of Session OT - End of Session Equipment Utilized During Treatment: Gait belt Activity Tolerance:  Patient limited by fatigue Patient left: in bed;with call bell in reach;Other (comment) (RT present) General Behavior During Session: Other (comment) (pt appearing anxious, confused) Cognition: Impaired Cognitive Impairment: Pt oriented to self, not place, time, or situation.  No memory of events of past few days.  Evern Bio  10/14/2011, 9:30 AM [phone]

## 2011-10-15 DIAGNOSIS — G92 Toxic encephalopathy: Secondary | ICD-10-CM

## 2011-10-15 DIAGNOSIS — I219 Acute myocardial infarction, unspecified: Secondary | ICD-10-CM

## 2011-10-15 LAB — GLUCOSE, CAPILLARY
Glucose-Capillary: 139 mg/dL — ABNORMAL HIGH (ref 70–99)
Glucose-Capillary: 245 mg/dL — ABNORMAL HIGH (ref 70–99)
Glucose-Capillary: 213 mg/dL — ABNORMAL HIGH (ref 70–99)

## 2011-10-15 NOTE — Progress Notes (Signed)
Clinical Social Worker met with pt, pt daughter and son-in-law at bedside at length to discuss pt discharge plans. Discussed with pt and pt family that inpatient rehabilitation is unable to accept pt. Clinical Social Worker provided emotional support to pt daughter as we continued to discuss pt discharge plan and pt bed offers. Pt daughter made the decision for pt to go to [facility] for short term rehabilitation and pt agreeable as well. Clinical Social Worker spoke with [facility] who stated that pt can admit to facility on Saturday, 10/16/11. Clinical Social Worker notified MD. Weekend Clinical Social Worker to facilitate pt discharge needs to [facility].  Jacklynn Lewis, MSW, LCSWA  Clinical Social Work [phone]

## 2011-10-15 NOTE — Progress Notes (Signed)
Subjective: Patient denies complaints but is pleasantly confused. Not insisting on going home today but asking when he can go to the "rest home"  Objective: Blood pressure 123/70, pulse 66, temperature 97.4 F (36.3 C), temperature source Oral, resp. rate 18, height 5\' 10"  (1.778 m), weight 126.9 kg (279 lb 12.2 oz), SpO2 98.00%.  Intake/Output Summary (Last 24 hours) at 10/15/11 1732 Last data filed at 10/15/11 1300  Gross per 24 hour  Intake    840 ml  Output    670 ml  Net    170 ml    General Exam: Comfortable.  Respiratory System: Clear. No increased work of breathing. Cardiovascular System: First and second heart sounds heard. Regular rate and rhythm. No JVD/murmurs. Gastrointestinal System: Abdomen is non distended, soft and normal bowel sounds heard. Central Nervous System: Alert and oriented self, place but not to time,. No focal neurological deficits.  Lab Results: Basic Metabolic Panel:  Basename 10/13/11 0552  NA 134*  K 4.6  CL 98  CO2 29  GLUCOSE 170*  BUN 32*  CREATININE 0.88  CALCIUM 9.1  MG --  PHOS --   Liver Function Tests:  Basename 10/13/11 0552  AST 14  ALT 19  ALKPHOS 53  BILITOT 0.5  PROT 6.8  ALBUMIN 2.9*   No results found for this basename: LIPASE:2,AMYLASE:2 in the last 72 hours No results found for this basename: AMMONIA:2 in the last 72 hours CBC:  Basename 10/13/11 0552  WBC 9.7  NEUTROABS --  HGB 10.6*  HCT 36.3*  MCV 88.5  PLT 155   BNP:  Basename 10/13/11 0552  PROBNP 5170.0*   CBG:  Basename 10/15/11 1709 10/15/11 0748 10/14/11 2230 10/14/11 1634 10/14/11 1145 10/14/11 0753  GLUCAP 245* 139* 202* 274* 175* 141*      Micro Results: Recent Results (from the past 240 hour(s))  MRSA PCR SCREENING     Status: Normal   Collection Time   10/05/11  7:36 PM      Component Value Range Status Comment   MRSA by PCR NEGATIVE  NEGATIVE  Final   CULTURE, BLOOD (ROUTINE X 2)     Status: Normal   Collection Time   10/05/11  8:14 PM      Component Value Range Status Comment   Specimen Description BLOOD RIGHT HAND   Final    Special Requests BOTTLES DRAWN AEROBIC AND ANAEROBIC 10CC   Final    Setup Time [id number]   Final    Culture NO GROWTH 5 DAYS   Final    Report Status 10/12/2011 FINAL   Final   CULTURE, BLOOD (ROUTINE X 2)     Status: Normal   Collection Time   10/05/11  8:15 PM      Component Value Range Status Comment   Specimen Description BLOOD LEFT HAND   Final    Special Requests BOTTLES DRAWN AEROBIC AND ANAEROBIC 10CC   Final    Setup Time [id number]   Final    Culture NO GROWTH 5 DAYS   Final    Report Status 10/12/2011 FINAL   Final   URINE CULTURE     Status: Normal   Collection Time   10/05/11  8:44 PM      Component Value Range Status Comment   Specimen Description URINE, CATHETERIZED   Final    Special Requests NONE   Final    Setup Time [id number]   Final    Colony Count NO GROWTH   Final  Culture NO GROWTH   Final    Report Status 10/07/2011 FINAL   Final     Studies/Results: Dg Chest 1 View  10/13/2011  *RADIOLOGY REPORT*  Clinical Data: Pneumonia  CHEST - 1 VIEW  Comparison: 10/10/2011  Findings: Small left pleural effusion.  Stable vascular congestion. Low volumes.  Left basilar atelectasis.  Mild cardiomegaly.  IMPRESSION: Mild vascular congestion is stable.  Small left pleural effusion and basilar atelectasis.  Original Report Authenticated By: Donavan Burnet, M.D.   Dg Chest Port 1 View  10/10/2011  *RADIOLOGY REPORT*  Clinical Data: Pulmonary edema.  Shortness of breath.  PORTABLE CHEST - 1 VIEW  Comparison: 10/08/2011  Findings: The pulmonary edema seen on the prior study has almost completely resolved.  Heart size and vascularity are normal.  No effusions.  IMPRESSION: Almost complete resolution of the bilateral pulmonary edema.  Original Report Authenticated By: Gwynn Burly, M.D.   Dg Chest Port 1 View  10/08/2011  *RADIOLOGY REPORT*  Clinical  Data: Pneumonia  PORTABLE CHEST - 1 VIEW  Comparison: Yesterday  Findings: Low volumes.  Diffuse edema.  No pneumothorax.  Mild cardiomegaly.  IMPRESSION: Stable edema.  Original Report Authenticated By: Donavan Burnet, M.D.   Dg Chest Port 1 View  10/07/2011  *RADIOLOGY REPORT*  Clinical Data: Endotracheal tube, shortness of breath.  PORTABLE CHEST - 1 VIEW  Comparison: 10/06/2011.  Findings: Trachea is midline.  Heart size stable.  Lungs are somewhat low in volume with diffuse bilateral air space disease. Focality in the right perihilar region has dissipated somewhat.  No definite pleural fluid.  IMPRESSION: Diffuse bilateral air space disease with slight improvement in the right perihilar consolidation.  Original Report Authenticated By: Reyes Ivan, M.D.   Dg Chest Port 1 View  10/06/2011  *RADIOLOGY REPORT*  Clinical Data: Intubated patient.  Short of breath.  PORTABLE CHEST - 1 VIEW  Comparison: 10/05/2011.  Findings: Improved aeration of the right upper lobe with persistent airspace disease respecting the major fissure.  Cardiomegaly. 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 Monitoring leads are projected over the chest.  The left costophrenic angle is excluded from view.  IMPRESSION: Improving aeration with persistent right lung airspace disease, more focal in the right upper lobe.  Original Report Authenticated By: Andreas Newport, M.D.    Medications: Scheduled Meds:    . ipratropium  0.5 mg Nebulization QID   And  . albuterol  2.5 mg Nebulization QID  . allopurinol  300 mg Oral Daily  . aspirin  325 mg Oral Daily  . FLUoxetine  20 mg Oral Daily  . fluticasone  1 puff Inhalation BID  . folic acid  1 mg Oral Daily  . heparin subcutaneous  5,000 Units Subcutaneous Q8H  . insulin aspart  0-15 Units Subcutaneous TID WC  . insulin aspart  0-5 Units Subcutaneous QHS  . insulin aspart  3 Units Subcutaneous TID WC  . insulin glargine  5 Units Subcutaneous QHS  .  levothyroxine  150 mcg Oral Q breakfast  . metoprolol  75 mg Oral BID  . moxifloxacin  400 mg Oral q1800  . pantoprazole  40 mg Oral Q1200  . predniSONE  20 mg Oral Q breakfast  . simvastatin  40 mg Oral QHS  . spironolactone  50 mg Oral Daily   Continuous Infusions:  PRN Meds:.acetaminophen, albuterol, HYDROcodone-acetaminophen, LORazepam  Assessment/Plan: 1. Altered mental status, secondary to metabolic/alcoholic encephalopathy: Although mental status may be better compared to that  on admission, patient is still confused and does not seem to have the capacity to make a decision regarding discharge environment. Patient and health care power of attorney apparently have agreed on a skilled nursing facility placement. Patient may be discharged to the same over the weekend. ? Element of dementia. 2. COPD: Stable. need to rule out obstructive sleep apnea by outpatient sleep study. 3. Community acquired pneumonia versus aspiration pneumonia. Continue Avelox 4. Atrial fibrillation: Controlled ventricular rate. Not a long-term Coumadin candidate secondary to history of alcohol abuse 5. Acute on chronic diastolic congestive heart failure: Compensated 6. Deconditioning: Requested CIR input. 7. Anemia: Stable  Disposition: Possible discharge to skilled nursing facility tomorrow.   HONGALGI,ANAND 10/15/2011, 5:32 PM

## 2011-10-16 MED ORDER — FUROSEMIDE 20 MG PO TABS: 20.0000 mg | ORAL_TABLET | Freq: Every day | ORAL | Status: DC

## 2011-10-16 MED ORDER — POTASSIUM CHLORIDE ER 10 MEQ PO TBCR: 10.0000 meq | EXTENDED_RELEASE_TABLET | Freq: Every day | ORAL | Status: DC

## 2011-10-16 MED ORDER — PREDNISONE 20 MG PO TABS: 10.0000 mg | ORAL_TABLET | Freq: Every day | ORAL | Status: DC

## 2011-10-16 NOTE — Progress Notes (Signed)
Subjective: Pt mentions that he feels much better today.  He reports no new complaints or any acute issues overnight.  Objective: Filed Vitals:   10/15/11 1523 10/15/11 2105 10/16/11 0527 10/16/11 0837  BP:  160/89 152/75   Pulse:  71 60   Temp:  98.2 F (36.8 C) 98.4 F (36.9 C)   TempSrc:  Oral Oral   Resp:  19 20   Height:      Weight:   127.1 kg (280 lb 3.3 oz)   SpO2: 98% 98% 95% 97%   Weight change: 0.2 kg (7.1 oz)  Intake/Output Summary (Last 24 hours) at 10/16/11 0852 Last data filed at 10/16/11 0835  Gross per 24 hour  Intake    840 ml  Output      7 ml  Net    833 ml    General: Alert, awake, oriented x3, in no acute distress.  HEENT: No bruits, no goiter.  Heart: Regular rate and rhythm, without murmurs, rubs, gallops.  Lungs: CTA BL.  Abdomen: Soft, nontender, nondistended, positive bowel sounds.  Neuro: Grossly intact, nonfocal.   Lab Results: No results found for this basename: NA:2,K:2,CL:2,CO2:2,GLUCOSE:2,BUN:2,CREATININE:2,CALCIUM:2,MG:2,PHOS:2 in the last 72 hours No results found for this basename: AST:2,ALT:2,ALKPHOS:2,BILITOT:2,PROT:2,ALBUMIN:2 in the last 72 hours No results found for this basename: LIPASE:2,AMYLASE:2 in the last 72 hours No results found for this basename: WBC:2,NEUTROABS:2,HGB:2,HCT:2,MCV:2,PLT:2 in the last 72 hours No results found for this basename: CKTOTAL:3,CKMB:3,CKMBINDEX:3,TROPONINI:3 in the last 72 hours No components found with this basename: POCBNP:3 No results found for this basename: DDIMER:2 in the last 72 hours No results found for this basename: HGBA1C:2 in the last 72 hours No results found for this basename: CHOL:2,HDL:2,LDLCALC:2,TRIG:2,CHOLHDL:2,LDLDIRECT:2 in the last 72 hours No results found for this basename: TSH,T4TOTAL,FREET3,T3FREE,THYROIDAB in the last 72 hours No results found for this basename: VITAMINB12:2,FOLATE:2,FERRITIN:2,TIBC:2,IRON:2,RETICCTPCT:2 in the last 72 hours  Micro Results: No  results found for this or any previous visit (from the past 240 hour(s)).  Studies/Results: No results found.  Medications: I have reviewed the patient's current medications.  Assessment/Plan:  1. Altered mental status, secondary to metabolic/alcoholic encephalopathy: Although mental status may be better compared to that on admission, patient is still confused and does not seem to have the capacity to make a decision regarding discharge environment despite the fact that the patient states otherwise. Discussed with patient's daughter Ms. Merlyn Lot and they are agreeable to discharge of patient to SNF today. 2. COPD: Stable need to rule out obstructive sleep apnea by outpatient sleep study. 3. Community acquired pneumonia versus aspiration pneumonia. Resolved patient has completed 7 day course of avelox and is currently asymptomatic.  Currently satting 97 % on RA 4. Atrial fibrillation: Controlled ventricular rate. Not a long-term Coumadin candidate secondary to history of alcohol abuse 5. Acute on chronic diastolic congestive heart failure: Compensated 6. Deconditioning: Requested CIR input. 7. Anemia: Stable  Discussed patient's care at length with his daughter Ms. Merlyn Lot and her husband.       LOS: 11 days   Penny Pia M.D.  [facility] 10/16/2011, 8:52 AM

## 2011-10-16 NOTE — Progress Notes (Signed)
1200  Pt iv d/c rt f/a without problem, pt continues with 2 abrasions, 1 on each knee, dsg on right & left open to air. Otherwise skin with scattered bruises that are resolving.  C/o of sore throat, assessment revealed no redness, otherwise no pain..  C/o he needs to go to bathroom to have bowel movement, keeps having small soft stools.  No clothing provided for pt, large disposable shirt provided, slit in back to fit pt, disposable pants to not fit pt at all, sent with ambulance with sheet covering lower part of body.  Pt is voiding without problem.  Continues confused with short term memory problem/forgetfulness.  Pt daughter is to meet at nsg facility being transferred to with clothing etc.  All necessary papers sent with pt and brief report given to ambulance transporters.  Bonney Leitz RN

## 2011-10-16 NOTE — Discharge Summary (Signed)
Admit date: 10/05/2011 Discharge date: 10/16/2011  Primary Care Physician:  [name], MD   Discharge Diagnoses:   No resolved problems to display.  Active Hospital Problems  Diagnoses Date Noted   . Renal insufficiency 10/06/2011   . NSTEMI (non-ST elevated myocardial infarction) 10/06/2011   . Campath-induced atrial fibrillation    . ETOH abuse    . CAD 06/20/2009   . CHRONIC OBSTRUCTIVE PULMONARY DISEASE 06/20/2009   . HYPERTENSION 06/20/2009   . HYPERLIPIDEMIA 06/20/2009   . DM 06/20/2009     Resolved Hospital Problems  Diagnoses Date Noted Date Resolved     DISCHARGE MEDICATION: Current Discharge Medication List    START taking these medications   Details  potassium chloride (K-DUR) 10 MEQ tablet Take 1 tablet (10 mEq total) by mouth daily. Qty: 15 tablet, Refills: 0      CONTINUE these medications which have CHANGED   Details  furosemide (LASIX) 20 MG tablet Take 1 tablet (20 mg total) by mouth daily. Qty: 30 tablet, Refills: 0      CONTINUE these medications which have NOT CHANGED   Details  allopurinol (ZYLOPRIM) 300 MG tablet Take 300 mg by mouth daily.      aspirin 325 MG tablet Take 325 mg by mouth daily.      FLUoxetine (PROZAC) 20 MG capsule Take 20 mg by mouth daily.      fluticasone (FLOVENT HFA) 220 MCG/ACT inhaler Inhale 1 puff into the lungs 2 (two) times daily.      levothyroxine (SYNTHROID, LEVOTHROID) 150 MCG tablet Take 150 mcg by mouth daily.      magnesium oxide (MAG-OX) 400 MG tablet Take 400 mg by mouth daily.      metoprolol (LOPRESSOR) 50 MG tablet Take 50 mg by mouth 2 (two) times daily.      omeprazole (PRILOSEC) 20 MG capsule Take 20 mg by mouth daily.      simvastatin (ZOCOR) 40 MG tablet Take 40 mg by mouth at bedtime.      spironolactone (ALDACTONE) 50 MG tablet Take 50 mg by mouth daily.        STOP taking these medications     potassium chloride SA (K-DUR,KLOR-CON) 20 MEQ tablet      albuterol (ACCUNEB) 0.63  MG/3ML nebulizer solution      Fish Oil-Cholecalciferol (FISH OIL + D3) 1200-1000 MG-UNIT CAPS      glipiZIDE (GLUCOTROL) 10 MG tablet      Ipratropium-Albuterol (COMBIVENT IN)      Multiple Vitamin (MULTIVITAMIN) capsule            Consults:     SIGNIFICANT DIAGNOSTIC STUDIES:  Dg Chest 1 View  10/13/2011  *RADIOLOGY REPORT*  Clinical Data: Pneumonia  CHEST - 1 VIEW  Comparison: 10/10/2011  Findings: Small left pleural effusion.  Stable vascular congestion. Low volumes.  Left basilar atelectasis.  Mild cardiomegaly.  IMPRESSION: Mild vascular congestion is stable.  Small left pleural effusion and basilar atelectasis.  Original Report Authenticated By: Donavan Burnet, M.D.   Dg Chest Port 1 View  10/10/2011  *RADIOLOGY REPORT*  Clinical Data: Pulmonary edema.  Shortness of breath.  PORTABLE CHEST - 1 VIEW  Comparison: 10/08/2011  Findings: The pulmonary edema seen on the prior study has almost completely resolved.  Heart size and vascularity are normal.  No effusions.  IMPRESSION: Almost complete resolution of the bilateral pulmonary edema.  Original Report Authenticated By: Gwynn Burly, M.D.   Dg Chest Port 1 View  10/08/2011  *RADIOLOGY  REPORT*  Clinical Data: Pneumonia  PORTABLE CHEST - 1 VIEW  Comparison: Yesterday  Findings: Low volumes.  Diffuse edema.  No pneumothorax.  Mild cardiomegaly.  IMPRESSION: Stable edema.  Original Report Authenticated By: Donavan Burnet, M.D.   Dg Chest Port 1 View  10/07/2011  *RADIOLOGY REPORT*  Clinical Data: Endotracheal tube, shortness of breath.  PORTABLE CHEST - 1 VIEW  Comparison: 10/06/2011.  Findings: Trachea is midline.  Heart size stable.  Lungs are somewhat low in volume with diffuse bilateral air space disease. Focality in the right perihilar region has dissipated somewhat.  No definite pleural fluid.  IMPRESSION: Diffuse bilateral air space disease with slight improvement in the right perihilar consolidation.  Original Report  Authenticated By: Reyes Ivan, M.D.   Dg Chest Port 1 View  10/06/2011  *RADIOLOGY REPORT*  Clinical Data: Intubated patient.  Short of breath.  PORTABLE CHEST - 1 VIEW  Comparison: 10/05/2011.  Findings: Improved aeration of the right upper lobe with persistent airspace disease respecting the major fissure.  Cardiomegaly. 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 Monitoring leads are projected over the chest.  The left costophrenic angle is excluded from view.  IMPRESSION: Improving aeration with persistent right lung airspace disease, more focal in the right upper lobe.  Original Report Authenticated By: Andreas Newport, M.D.     ECHO:Full report in chart 10/06/11 EF of 45-50%    CARDIAC CATH & OTHER PROCEDURES: Refer to chart no cath in chart.  No results found for this or any previous visit (from the past 240 hour(s)).  BRIEF ADMITTING H & P: Pt is a 66 y/o with a h/o COPD, OSA, CAD that was brought in secondary to AMS and SOB.  Reportedly had drank heavily prior to admission.  Initially had elevated CE's and was started on heparin drip.  It is documented that patient had NON-ST elevated MI and zocor, aspirin, and metoprolol were initiated and continued while at the hospital.  Presently the patient is not SOB and while admitted there was suspicion that the patient had a CAP.  He completed a 7 day course of avelox.  Currently he is Asymptomatic no wheeze or difficulty breathing and is satting 97% on room air.  Will be d/c'd to snf as discussed with family.     No resolved problems to display.  Active Hospital Problems  Diagnoses Date Noted   . Renal insufficiency 10/06/2011   . NSTEMI (non-ST elevated myocardial infarction) 10/06/2011   . Campath-induced atrial fibrillation    . ETOH abuse    . CAD 06/20/2009   . CHRONIC OBSTRUCTIVE PULMONARY DISEASE 06/20/2009   . HYPERTENSION 06/20/2009   . HYPERLIPIDEMIA 06/20/2009   . DM 06/20/2009     Resolved Hospital  Problems  Diagnoses Date Noted Date Resolved     Disposition and Follow-up: He is to f/u with his PCP and or cardiologist for management of his lasix and close monitoring of his potassium levels.  He will also need to have his PCP work him up for dementia. Discharge Orders    Future Orders Please Complete By Expires   Diet - low sodium heart healthy      Increase activity slowly      Discharge instructions      Comments:   Pt is to f/u with PCP in 1-2 weeks.  Also is to check BMP to f/u with patient's K levels.  Pt has had history of some confusion while hospitalized.  Will need f/u  for dementia workup.        DISCHARGE EXAM:  General: Alert, awake, oriented x3, in no acute distress. HEENT: atraumatic, no goiter. Heart: irregularly irregular rate controlled, without murmurs, rubs, gallops. Lungs: Clear to auscultation bilaterally no wheezes Abdomen: Soft, nontender, nondistended, positive bowel sounds. Extremities: No clubbing cyanosis or edema with positive pedal pulses. Neuro: Grossly intact, nonfocal.    Blood pressure 152/75, pulse 60, temperature 98.4 F (36.9 C), temperature source Oral, resp. rate 20, height 5\' 10"  (1.778 m), weight 127.1 kg (280 lb 3.3 oz), SpO2 97.00%.  No results found for this basename: NA:2,K:2,CL:2,CO2:2,GLUCOSE:2,BUN:2,CREATININE:2,CALCIUM:2,MG:2,PHOS:2 in the last 72 hours No results found for this basename: AST:2,ALT:2,ALKPHOS:2,BILITOT:2,PROT:2,ALBUMIN:2 in the last 72 hours No results found for this basename: LIPASE:2,AMYLASE:2 in the last 72 hours No results found for this basename: WBC:2,NEUTROABS:2,HGB:2,HCT:2,MCV:2,PLT:2 in the last 72 hours  Signed: Penny Pia M.D. 10/16/2011, 9:31 AM

## 2011-10-16 NOTE — Progress Notes (Signed)
Clinical Social Work was notified by MD patient is ready for DC to [facility]. CSW spoke with patient and daughter regarding DC today, pt and family agreeable for transport. CSW contacted SNF and faxed over necessary paperwork. CSW called non-emergency ambulance to safely transport pt to SNF. No other CSW needs. CSW signing off. Kathrin Penner, LCSWA Weekend coverage [phone]

## 2011-10-18 LAB — GLUCOSE, CAPILLARY: Glucose-Capillary: 166 mg/dL — ABNORMAL HIGH (ref 70–99)

## 2011-11-02 ENCOUNTER — Encounter: Payer: Self-pay | Admitting: Cardiovascular Disease

## 2011-11-03 ENCOUNTER — Encounter: Payer: Self-pay | Admitting: Cardiovascular Disease

## 2011-12-02 ENCOUNTER — Ambulatory Visit (INDEPENDENT_AMBULATORY_CARE_PROVIDER_SITE_OTHER): Payer: Medicare Other | Admitting: Cardiovascular Disease

## 2011-12-02 ENCOUNTER — Encounter: Payer: Self-pay | Admitting: Cardiovascular Disease

## 2011-12-02 VITALS — BP 125/80 | HR 66 | Ht 69.0 in | Wt 252.0 lb

## 2011-12-02 DIAGNOSIS — I5032 Chronic diastolic (congestive) heart failure: Secondary | ICD-10-CM

## 2011-12-02 DIAGNOSIS — I2510 Atherosclerotic heart disease of native coronary artery without angina pectoris: Secondary | ICD-10-CM

## 2011-12-02 DIAGNOSIS — I509 Heart failure, unspecified: Secondary | ICD-10-CM

## 2011-12-02 DIAGNOSIS — I4891 Unspecified atrial fibrillation: Secondary | ICD-10-CM

## 2011-12-02 NOTE — Progress Notes (Signed)
History of Present Illness: 66 yo WM with history of CAD s/p 4V CABG 2001, atrial fibrillation, COPD, OSA, obesity, venous insufficiency and diastolic CHF here for cardiac follow up. He has been followed by Dr. Juanda Chance. I saw him for the first time in May 2012. He had bypass surgery in 2001. He had a negative Myoview scan in 2010. In 2010 he developed paroxysmal fibrillation and has been in persistent atrial fibrillation. He was not felt to be a Coumadin candidate because of alcohol consumption. He did have a [place] score 3. He was admitted in 2011 with cellulitis and has had issues with CHF in the past. Carotid artery dopplers December 2012 with stable bilateral disease (40-59% bilateral stenosis, left CEA patch angioplasty is patent). He was admitted to [facility] December 2012 with pneumonia, volume overload, AMS, had mild bump in troponin. This was felt to be demand ischemia. No chest pain or EKG changes. He was discharged to rehab on 10/16/11 and is still in SNF.   He is here today for f/u. No chest pain.  He admits to constant SOB but this is chronic and unchanged.  He is not aware of any irregularities of his heart rhythm. No palpitations, near syncope or syncope. He has stopped drinking etoh. He tells me that he will not restart after he leaves SNF. He has his OSA followed in [place].   Primary Care Physician:  [name] in [facility]  Last Lipid Profile: Followed in primary care.   Past Medical History  Diagnosis Date  . CHRONIC OBSTRUCTIVE PULMONARY DISEASE 06/20/2009  . OBSTRUCTIVE SLEEP APNEA 06/20/2009  . CAROTID STENOSIS 06/20/2009    A. 08/2001 s/p L CEA;  B.   09/14/11 - Carotid U/S - 40-59% bilateral stenosis, left CEA patch angioplasty is patent  . DM 06/20/2009  . CAD 06/20/2009    A.  08/2000 - s/p CABG x 4 - LIMA-LAD, Left Radial-OM, VG-DIAG, VG-RCA;  B. Neg. MV  2010  . HYPERLIPIDEMIA 06/20/2009  . HYPERTENSION 06/20/2009  . Overweight   . Hypothyroidism   . Low  back pain   . Asthma     as child  . Pneumonia   . Atrial fibrillation     Not felt to be coumadin candidate 2/2 ETOH use.  Marland Kitchen Hypothyroidism   . ETOH abuse   . History of tobacco abuse     remote - quit 1970    Past Surgical History  Procedure Date  . Carotid endarterectomy 2002    left  . Coronary artery bypass graft     x 4 - 2001    Current Outpatient Prescriptions  Medication Sig Dispense Refill  . acetaminophen (TYLENOL) 650 MG suppository Place 650 mg rectally every 4 (four) hours as needed.      Marland Kitchen allopurinol (ZYLOPRIM) 300 MG tablet Take 300 mg by mouth daily.        Marland Kitchen aspirin 325 MG tablet Take 325 mg by mouth daily.        . Calcium Carbonate Antacid (TUMS PO) Take by mouth every 6 (six) hours as needed.      . cephALEXin (KEFLEX) 500 MG capsule Take 500 mg by mouth 2 (two) times daily.      Marland Kitchen FLUoxetine (PROZAC) 20 MG capsule Take 20 mg by mouth daily.        . fluticasone (FLOVENT HFA) 220 MCG/ACT inhaler Inhale 1 puff into the lungs 2 (two) times daily.        Marland Kitchen  furosemide (LASIX) 20 MG tablet Take 1 tablet (20 mg total) by mouth daily.  30 tablet  0  . glipiZIDE (GLUCOTROL) 10 MG tablet Take 10 mg by mouth 2 (two) times daily before a meal.      . HYDROmorphone (DILAUDID) 2 MG tablet Take 2 mg by mouth every 8 (eight) hours as needed.      . insulin aspart (NOVOLOG) 100 UNIT/ML injection Inject into the skin 3 (three) times daily before meals.      . insulin glargine (LANTUS) 100 UNIT/ML injection Inject into the skin at bedtime.      Marland Kitchen levothyroxine (SYNTHROID, LEVOTHROID) 150 MCG tablet Take 150 mcg by mouth daily.        . magnesium oxide (MAG-OX) 400 MG tablet Take 400 mg by mouth daily.        . metolazone (ZAROXOLYN) 5 MG tablet Take 5 mg by mouth daily.      . metoprolol (LOPRESSOR) 50 MG tablet Take 50 mg by mouth 2 (two) times daily.        . Multiple Vitamin (MULTIVITAMIN) capsule Take 1 capsule by mouth daily.      Marland Kitchen omeprazole (PRILOSEC) 20 MG capsule  Take 20 mg by mouth daily.        . potassium chloride (K-DUR) 10 MEQ tablet Take 1 tablet (10 mEq total) by mouth daily.  15 tablet  0  . SIMETHICONE PO Take 80 mg by mouth as needed.      . simvastatin (ZOCOR) 40 MG tablet Take 40 mg by mouth at bedtime.        Marland Kitchen spironolactone (ALDACTONE) 50 MG tablet Take 50 mg by mouth daily.        . traZODone (DESYREL) 50 MG tablet Take 50 mg by mouth at bedtime.        Allergies  Allergen Reactions  . Erythromycin     Took as a child for tooth ache and unable to recall reaction    History   Social History  . Marital Status: Widowed    Spouse Name: N/A    Number of Children: N/A  . Years of Education: N/A   Occupational History  . retired    Social History Main Topics  . Smoking status: Former Smoker    Quit date: 10/11/1968  . Smokeless tobacco: Not on file  . Alcohol Use: Yes     wild [place] daily;  also reports regularly drinking a pint of vodka.  . Drug Use: Yes    Special: Marijuana     last 1 year ago  . Sexually Active: Not on file   Other Topics Concern  . Not on file   Social History Narrative   Lives in [place] with dtr, son-in-law    Family History  Problem Relation Age of Onset  . Stroke Mother     ?  . Stroke Father     ?    Review of Systems:  As stated in the HPI and otherwise negative.   BP 125/80  Pulse 66  Ht 5\' 9"  (1.753 m)  Wt 252 lb (114.306 kg)  BMI 37.21 kg/m2  Physical Examination: General: Well developed, well nourished, NAD HEENT: OP clear, mucus membranes moist SKIN: warm, dry. No rashes. Neuro: No focal deficits Musculoskeletal: Muscle strength 5/5 all ext Psychiatric: Mood and affect normal Neck: No JVD, no carotid bruits, no thyromegaly, no lymphadenopathy. Lungs:Clear bilaterally, no wheezes, rhonci, crackles Cardiovascular: Irregular.  No murmurs, gallops or rubs.  Abdomen:Soft. Bowel sounds present. Non-tender.  Extremities: Trace bilateral lower extremity edema with  chronic venous stasis changes.   Echo: 09/28/11;  Left ventricle: The cavity size was normal. Wall thickness was increased in a pattern of mild LVH. Systolic function was normal. The estimated ejection fraction was 55%. Wall motion was normal; there were no regional wall motion abnormalities. - Aortic valve: There was no stenosis. - Mitral valve: Mildly to moderately calcified annulus. Mild regurgitation. - Left atrium: The atrium was moderately dilated. - Right ventricle: The cavity size was mildly dilated. Systolic function was mildly reduced. - Right atrium: The atrium was mildly dilated. - Tricuspid valve: Peak RV-RA gradient:2mm Hg (S). - Pulmonary arteries: PA systolic pressure 54-58 mmHg. - Systemic veins: IVC measured 2.3 cm with < 50% respirophasic variation, suggesting RA pressure 11-15 mmHg. Impressions:  - The patient was in atrial fibrillation. Normal LV size with mild LV hypertrophy. EF 55%. Mildly dilated RV with mildly decreased RV systolic function. Moderate pulmonary hypertension. Biatrial enlargement.

## 2011-12-02 NOTE — Assessment & Plan Note (Signed)
Rate is controlled. He has not been felt to be a coumadin candidate in the past secondary to his etoh abuse.

## 2011-12-02 NOTE — Assessment & Plan Note (Signed)
Volume status is much better. Continue metolazone and Lasix at present doses.

## 2011-12-02 NOTE — Patient Instructions (Signed)
Your physician recommends that you schedule a follow-up appointment in: 3 months. Your physician recommends that you continue on your current medications as directed. Please refer to the Current Medication list given to you today. 

## 2011-12-02 NOTE — Assessment & Plan Note (Signed)
Stable. Mild troponin bump in the setting of pneumonia. Likely demand ischemia. NO changes today.

## 2012-02-09 ENCOUNTER — Ambulatory Visit (HOSPITAL_BASED_OUTPATIENT_CLINIC_OR_DEPARTMENT_OTHER): Payer: Medicare Other | Attending: Family Medicine | Admitting: Radiology

## 2012-02-09 VITALS — Ht 68.0 in | Wt 235.0 lb

## 2012-02-09 DIAGNOSIS — R259 Unspecified abnormal involuntary movements: Secondary | ICD-10-CM | POA: Insufficient documentation

## 2012-02-09 DIAGNOSIS — G4733 Obstructive sleep apnea (adult) (pediatric): Secondary | ICD-10-CM | POA: Insufficient documentation

## 2012-02-09 DIAGNOSIS — Z79899 Other long term (current) drug therapy: Secondary | ICD-10-CM | POA: Insufficient documentation

## 2012-02-10 NOTE — Progress Notes (Deleted)
Subjective:     Patient ID: [name], male   DOB: 1946-07-31, 66 y.o.   MRN: [id number]  HPI   Review of Systems     Objective:   Physical Exam     Assessment:     ***    Plan:     ***

## 2012-02-10 NOTE — Progress Notes (Unsigned)
PT WAS ORDER A NPSG STUDY.  PT WAS ON CPAP FOR 10  TO 12 YRS BEFOR  02/09/12.  PT CPAP  WERE  OLD AND TORE UP. SPOKE WITH DAVIE JENKINS AND HE CALLED DR. YOUNG AND GOT  ODERS OVER THE PHONE TO DO SPLIT NIGHT STUDY.

## 2012-02-12 NOTE — Procedures (Signed)
NAME:  [name], [name]               ACCOUNT NO.:  [id number]  MEDICAL RECORD NO.:  [id number]          PATIENT TYPE:  OUT  LOCATION:  SLEEP CENTER                 FACILITY:  [facility]  PHYSICIAN:  Mako Pelfrey D. Maple Hudson, MD, FCCP, FACPDATE OF BIRTH:  September 28, 1946  DATE OF STUDY:  02/09/2012                           NOCTURNAL POLYSOMNOGRAM  REFERRING PHYSICIAN:  DAVID GIBSON  REFERRING PHYSICIAN:  [name], MD  INDICATION FOR STUDY:  Hypersomnia with sleep apnea.  EPWORTH SLEEPINESS SCORE:  14/24.  BMI 35.7, weight 235 pounds, height 68 inches, neck 17 inches.  MEDICATIONS:  Home medications are charted and reviewed.  SLEEP ARCHITECTURE:  Split study protocol.  During the diagnostic phase, total sleep time 122 minutes with sleep efficiency 69.9%.  Stage I was 10.2%, stage II 89.8%, stages III and REM were absent.  Sleep latency 25.5 minutes, awake after sleep onset 27 minutes, and arousal index 20.7.  Bedtime medications:  Simvastatin, lorazepam, and hydromorphone.  RESPIRATORY DATA:  Split study protocol.  Apnea-hypopnea index (AHI) 37.9 per hour.  A total of 77 events was scored including 6 obstructive apneas and 71 hypopneas.  Events were associated with non-supine sleep. CPAP was then titrated to 15 CWP, AHI 0 per hour.  He wore a large [employer] FX full face mask with heated humidifier.  OXYGEN DATA:  Before CPAP, snoring was moderate with oxygen desaturation to a nadir of 72% on room air.  With CPAP titration, snoring was prevented.  Mean oxygen saturation 88.9% on room air.  Final saturations at final pressure were around 90-91% on room air.  CARDIAC DATA:  Sinus rhythm with occasional PAC and PVC.  MOVEMENT-PARASOMNIA:  Frequent limb jerks.  During the diagnostic phase, a total of 168 limb jerks was recorded of which 13 were associated with arousals or awakenings for periodic limb movement with arousal index of 6.4 per hour.  During the titration phase, a total of 198  limb jerks was counted of which 12 were associated with arousals or awakenings for periodic limb movement with arousal index of 4.2 per hour.  Bathroom x1.  IMPRESSIONS-RECOMMENDATION: 1. Severe obstructive sleep apnea/hypopnea syndrome, apnea-hypopnea     index 37.9 per hour with non-supine events.  Moderate snoring with     oxygen desaturation to a nadir of 72% on room air. 2. Successful continuous positive airway pressure titration to 15 cm     of water pressure, apnea-hypopnea index 0 per hour.  He wore a     large [employer] FX full face mask with heated humidifier.     Snoring was prevented.  Mean oxygen saturation across the titration     phase was 88.9% on room air.  Final saturations at final pressures     had reached 90-91% saturation on room air.  He wore a large [employer] FX full face mask with heated humidifier.     Adjoa Althouse D. Maple Hudson, MD, [facility], FACP Diplomate, American Board of Sleep Medicine    CDY/MEDQ  D:  02/12/2012 12:07:48  T:  02/12/2012 16:14:38  Job:  [id number]

## 2012-02-24 ENCOUNTER — Encounter: Payer: Self-pay | Admitting: Cardiovascular Disease

## 2012-03-07 ENCOUNTER — Encounter: Payer: Self-pay | Admitting: Cardiovascular Disease

## 2012-03-07 ENCOUNTER — Ambulatory Visit (INDEPENDENT_AMBULATORY_CARE_PROVIDER_SITE_OTHER): Payer: Medicare Other | Admitting: Cardiovascular Disease

## 2012-03-07 VITALS — BP 123/64 | HR 60 | Ht 70.0 in | Wt 244.0 lb

## 2012-03-07 DIAGNOSIS — I2510 Atherosclerotic heart disease of native coronary artery without angina pectoris: Secondary | ICD-10-CM

## 2012-03-07 DIAGNOSIS — I4891 Unspecified atrial fibrillation: Secondary | ICD-10-CM

## 2012-03-07 DIAGNOSIS — I509 Heart failure, unspecified: Secondary | ICD-10-CM

## 2012-03-07 DIAGNOSIS — I5032 Chronic diastolic (congestive) heart failure: Secondary | ICD-10-CM

## 2012-03-07 MED ORDER — HYDROMORPHONE HCL 2 MG PO TABS: 2.0000 mg | ORAL_TABLET | Freq: Three times a day (TID) | ORAL | Status: DC | PRN

## 2012-03-07 MED ORDER — FUROSEMIDE 80 MG PO TABS: 80.0000 mg | ORAL_TABLET | Freq: Every day | ORAL | Status: DC

## 2012-03-07 NOTE — Patient Instructions (Signed)
Your physician wants you to follow-up in: 6 months. You will receive a reminder letter in the mail two months in advance. If you don't receive a letter, please call our office to schedule the follow-up appointment.  Your physician has recommended you make the following change in your medication: Change furosemide to 80 mg by mouth daily

## 2012-03-07 NOTE — Progress Notes (Signed)
History of Present Illness: 66 yo WM with history of CAD s/p 4V CABG 2001, atrial fibrillation, COPD, OSA, obesity, venous insufficiency and diastolic CHF here for cardiac follow up. He has been followed by Dr. Juanda [name]. I saw him for the first time in May 2012. He had bypass surgery in 2001. He had a negative Myoview scan in 2010. In 2010 he developed paroxysmal fibrillation and has been in persistent atrial fibrillation. He was not felt to be a Coumadin candidate because of alcohol consumption. He did have a [place] score 3. He was admitted in 2011 with cellulitis and has had issues with CHF in the past. Carotid artery dopplers December 2012 with stable bilateral disease (40-59% bilateral stenosis, left CEA patch angioplasty is patent). He was admitted to [facility] December 2012 with pneumonia, volume overload, AMS, had mild bump in troponin. This was felt to be demand ischemia. No chest pain at that time. I saw him 12/02/11 and he was feeling well except for constant SOB. He was not aware of any irregularities of his heart rhythm. He has his OSA followed in [place].   He is here today for follow up. His metolazone and spironolactone were stopped secondary to hypotension. His Lasix was cut in half. He has been taking Lopressor 50 mg po once daily. He is doing well. Weight is stable at home. No chest pain or SOB. He gets around but slowly  Primary Care Physician: [name]   Past Medical History  Diagnosis Date  . CHRONIC OBSTRUCTIVE PULMONARY DISEASE 06/20/2009  . OBSTRUCTIVE SLEEP APNEA 06/20/2009  . CAROTID STENOSIS 06/20/2009    A. 08/2001 s/p L CEA;  B.   09/14/11 - Carotid U/S - 40-59% bilateral stenosis, left CEA patch angioplasty is patent  . DM 06/20/2009  . CAD 06/20/2009    A.  08/2000 - s/p CABG x 4 - LIMA-LAD, Left Radial-OM, VG-DIAG, VG-RCA;  B. Neg. MV  2010  . HYPERLIPIDEMIA 06/20/2009  . HYPERTENSION 06/20/2009  . Overweight   . Hypothyroidism   . Low back pain   .  Asthma     as child  . Pneumonia   . Atrial fibrillation     Not felt to be coumadin candidate 2/2 ETOH use.  Marland Kitchen Hypothyroidism   . ETOH abuse   . History of tobacco abuse     remote - quit 1970    Past Surgical History  Procedure Date  . Carotid endarterectomy 2002    left  . Coronary artery bypass graft     x 4 - 2001    Current Outpatient Prescriptions  Medication Sig Dispense Refill  . allopurinol (ZYLOPRIM) 300 MG tablet Take 300 mg by mouth daily.        Marland Kitchen aspirin 325 MG tablet Take 325 mg by mouth daily.        Marland Kitchen FLUoxetine (PROZAC) 20 MG capsule Take 20 mg by mouth daily.        . fluticasone (FLOVENT HFA) 220 MCG/ACT inhaler Inhale 1 puff into the lungs 2 (two) times daily.        . furosemide (LASIX) 80 MG tablet Take 1/2 tab twice a ady      . glipiZIDE (GLUCOTROL) 10 MG tablet Take 10 mg by mouth 2 (two) times daily before a meal.      . HYDROmorphone (DILAUDID) 2 MG tablet Take 2 mg by mouth every 8 (eight) hours as needed.      . lactulose (CHRONULAC)  10 GM/15ML solution Take 20 g by mouth 3 (three) times daily. As needed      . levothyroxine (SYNTHROID, LEVOTHROID) 150 MCG tablet Take 150 mcg by mouth daily.        Marland Kitchen LORazepam (ATIVAN) 1 MG tablet Take 1 mg by mouth every 8 (eight) hours.      . magnesium oxide (MAG-OX) 400 MG tablet Take 2 pills daily      . metoprolol (LOPRESSOR) 50 MG tablet As directed  1 tab daily at this present time      . Multiple Vitamin (MULTIVITAMIN) capsule Take 1 capsule by mouth daily.      Marland Kitchen omeprazole (PRILOSEC) 20 MG capsule Take 20 mg by mouth daily.        . potassium chloride (KLOR-CON) 20 MEQ packet 1 tab daily      . senna (SENOKOT) 8.6 MG tablet Take 1 tablet by mouth daily. As needed      . simvastatin (ZOCOR) 40 MG tablet Take 40 mg by mouth at bedtime.        . traZODone (DESYREL) 50 MG tablet Take 50 mg by mouth at bedtime.      Marland Kitchen spironolactone (ALDACTONE) 50 MG tablet 50 mg. ON HOLD 5 /28 /2013        Allergies    Allergen Reactions  . Erythromycin     Took as a child for tooth ache and unable to recall reaction    History   Social History  . Marital Status: Widowed    Spouse Name: N/A    Number of Children: N/A  . Years of Education: N/A   Occupational History  . retired    Social History Main Topics  . Smoking status: Former Smoker    Quit date: 10/11/1968  . Smokeless tobacco: Not on file  . Alcohol Use: Yes     wild [place] daily;  also reports regularly drinking a pint of vodka.  . Drug Use: Yes    Special: Marijuana     last 1 year ago  . Sexually Active: Not on file   Other Topics Concern  . Not on file   Social History Narrative   Lives in [place] with dtr, son-in-law    Family History  Problem Relation Age of Onset  . Stroke Mother     ?  . Stroke Father     ?    Review of Systems:  As stated in the HPI and otherwise negative.   BP 123/64  Pulse 60  Ht 5\' 10"  (1.778 m)  Wt 244 lb (110.678 kg)  BMI 35.01 kg/m2  Physical Examination: General: Well developed, well nourished, NAD HEENT: OP clear, mucus membranes moist SKIN: warm, dry. No rashes. Neuro: No focal deficits Musculoskeletal: Muscle strength 5/5 all ext Psychiatric: Mood and affect normal Neck: No JVD,  no thyromegaly, no lymphadenopathy. Lungs:Clear bilaterally, no wheezes, rhonci, crackles Cardiovascular: Irregular rate and rhythm. Soft systolic murmur. No gallops or rubs. Abdomen:Soft. Bowel sounds present. Non-tender.  Extremities: Trace to 1+ bilateral lower extremity edema.

## 2012-03-07 NOTE — Assessment & Plan Note (Signed)
Stable. No changes today. 

## 2012-03-07 NOTE — Assessment & Plan Note (Signed)
Rate controlled. Not a good coumadin candidate. Continue current therapy.

## 2012-03-07 NOTE — Assessment & Plan Note (Addendum)
Volume status ok today. Weights are stable. Will continue Lasix 80 mg po Qdaily. He will take an extra Lasix if his weight increases by 3 pounds over 2-3 days.

## 2012-08-25 ENCOUNTER — Encounter (INDEPENDENT_AMBULATORY_CARE_PROVIDER_SITE_OTHER): Payer: Self-pay

## 2012-08-28 ENCOUNTER — Encounter (INDEPENDENT_AMBULATORY_CARE_PROVIDER_SITE_OTHER): Payer: Self-pay

## 2012-08-28 ENCOUNTER — Ambulatory Visit (INDEPENDENT_AMBULATORY_CARE_PROVIDER_SITE_OTHER): Payer: Medicare Other | Admitting: General Surgery

## 2012-08-28 ENCOUNTER — Encounter (INDEPENDENT_AMBULATORY_CARE_PROVIDER_SITE_OTHER): Payer: Self-pay | Admitting: General Surgery

## 2012-08-28 VITALS — BP 110/60 | HR 100 | Temp 96.6°F | Resp 16 | Wt 280.0 lb

## 2012-08-28 DIAGNOSIS — T148XXA Other injury of unspecified body region, initial encounter: Secondary | ICD-10-CM

## 2012-08-28 NOTE — Patient Instructions (Signed)
I believe you have multiple hematoma resulting from your falls.

## 2012-08-28 NOTE — Progress Notes (Signed)
Patient ID: [name], male   DOB: 1946-01-23, 66 y.o.   MRN: [id number]  No chief complaint on file.   HPI [name] is a 66 y.o. male.   HPI  He is referred by [name] for further evaluation of a right back mass.  He fell about 2 months ago. [name] was seen him and examined him and felt a mass in his right back. A CT scan of the chest, abdomen, and pelvis was ordered. 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 There is a concern as to what this soft tissue masses as well as a soft tissue mass found in his right chest wall area and right sacral area. Since the CT scan, he has fallen again. He has a large bruise on his left back per his daughter.  Past Medical History  Diagnosis Date  . CHRONIC OBSTRUCTIVE PULMONARY DISEASE 06/20/2009  . OBSTRUCTIVE SLEEP APNEA 06/20/2009  . CAROTID STENOSIS 06/20/2009    A. 08/2001 s/p L CEA;  B.   09/14/11 - Carotid U/S - 40-59% bilateral stenosis, left CEA patch angioplasty is patent  . DM 06/20/2009  . CAD 06/20/2009    A.  08/2000 - s/p CABG x 4 - LIMA-LAD, Left Radial-OM, VG-DIAG, VG-RCA;  B. Neg. MV  2010  . HYPERLIPIDEMIA 06/20/2009  . HYPERTENSION 06/20/2009  . Overweight   . Hypothyroidism   . Low back pain   . Asthma     as child  . Pneumonia   . Atrial fibrillation     Not felt to be coumadin candidate 2/2 ETOH use.  Marland Kitchen Hypothyroidism   . ETOH abuse   . History of tobacco abuse     remote - quit 1970    Past Surgical History  Procedure Date  . Carotid endarterectomy 2002    left  . Coronary artery bypass graft     x 4 - 2001    Family History  Problem Relation Age of Onset  . Stroke Mother     ?  . Stroke Father     ?    Social History History  Substance Use Topics  . Smoking status: Former Smoker    Quit date: 10/11/1968  . Smokeless tobacco: Not on file  . Alcohol Use: Yes     Comment: wild [place] daily;  also reports regularly  drinking a pint of vodka.    Allergies  Allergen Reactions  . Erythromycin     Took as a child for tooth ache and unable to recall reaction    Current Outpatient Prescriptions  Medication Sig Dispense Refill  . allopurinol (ZYLOPRIM) 300 MG tablet Take 300 mg by mouth daily.        Marland Kitchen aspirin 325 MG tablet Take 325 mg by mouth daily.        Marland Kitchen FLUoxetine (PROZAC) 20 MG capsule Take 20 mg by mouth daily.        . fluticasone (FLOVENT HFA) 220 MCG/ACT inhaler Inhale 1 puff into the lungs 2 (two) times daily.        . furosemide (LASIX) 80 MG tablet Take 1 tablet (80 mg total) by mouth daily.  90 tablet  3  . glipiZIDE (GLUCOTROL) 10 MG tablet Take 10 mg by mouth 2 (two) times daily before a meal.      . HYDROmorphone (DILAUDID) 2 MG tablet Take 1 tablet (2 mg  total) by mouth every 8 (eight) hours as needed.  20 tablet  0  . lactulose (CHRONULAC) 10 GM/15ML solution Take 20 g by mouth 3 (three) times daily. As needed      . levothyroxine (SYNTHROID, LEVOTHROID) 150 MCG tablet Take 150 mcg by mouth daily.        Marland Kitchen LORazepam (ATIVAN) 1 MG tablet Take 1 mg by mouth every 8 (eight) hours.      . magnesium oxide (MAG-OX) 400 MG tablet Take 2 pills daily      . metoprolol (LOPRESSOR) 50 MG tablet As directed  1 tab daily at this present time      . Multiple Vitamin (MULTIVITAMIN) capsule Take 1 capsule by mouth daily.      Marland Kitchen omeprazole (PRILOSEC) 20 MG capsule Take 20 mg by mouth daily.        . potassium chloride (KLOR-CON) 20 MEQ packet 1 tab daily      . senna (SENOKOT) 8.6 MG tablet Take 1 tablet by mouth daily. As needed      . simvastatin (ZOCOR) 40 MG tablet Take 40 mg by mouth at bedtime.        Marland Kitchen spironolactone (ALDACTONE) 50 MG tablet 50 mg. ON HOLD 5 /28 /2013      . traZODone (DESYREL) 50 MG tablet Take 50 mg by mouth at bedtime.        Review of Systems Review of Systems  Constitutional: Positive for fatigue. Negative for fever.  Cardiovascular: Positive for leg swelling.    Musculoskeletal: Positive for arthralgias.  Neurological: Positive for weakness.    Blood pressure 110/60, pulse 100, temperature 96.6 F (35.9 C), temperature source Temporal, resp. rate 16, weight 280 lb (127.007 kg).  Physical Exam Physical Exam  Constitutional:       Overweight elderly male who appears to be chronically ill and older than his stated age.  Pulmonary/Chest:       No palpable chest wall masses.  Musculoskeletal:       On the left back is a large hematoma with overlying ecchymosis. In the right back there is a smooth soft tissue mass present in the subcutaneous tissue.  In the right sacral area there is a smooth 3-4 cm soft tissue mass in the deep subcutaneous tissue.    Data Reviewed CT scan films and CT scan report. I've also discussed this with a different radiologist.  Assessment    Multiple subcutaneous hematomas following a fall. I doubt any of these are sarcomas. Even if they were, I do not believe he could tolerate the treatment needed for these.    Plan    He is a definite fall risk and this needs to be discussed. I do not think any further workup is warranted at this time.       Lashica Hannay J 08/28/2012, 12:02 PM

## 2012-08-30 ENCOUNTER — Inpatient Hospital Stay (HOSPITAL_COMMUNITY): Payer: Medicare Other

## 2012-08-30 ENCOUNTER — Inpatient Hospital Stay (HOSPITAL_COMMUNITY)
Admission: AD | Admit: 2012-08-30 | Discharge: 2012-09-08 | DRG: 280 | Disposition: A | Discharge status: Rehabilitation Facility | Payer: Medicare Other | Source: Other Acute Inpatient Hospital | Attending: Internal Medicine | Admitting: Internal Medicine

## 2012-08-30 DIAGNOSIS — F1010 Alcohol abuse, uncomplicated: Secondary | ICD-10-CM | POA: Diagnosis present

## 2012-08-30 DIAGNOSIS — I509 Heart failure, unspecified: Secondary | ICD-10-CM

## 2012-08-30 DIAGNOSIS — IMO0002 Reserved for concepts with insufficient information to code with codable children: Secondary | ICD-10-CM | POA: Diagnosis present

## 2012-08-30 DIAGNOSIS — F10239 Alcohol dependence with withdrawal, unspecified: Secondary | ICD-10-CM | POA: Diagnosis present

## 2012-08-30 DIAGNOSIS — E1165 Type 2 diabetes mellitus with hyperglycemia: Secondary | ICD-10-CM | POA: Diagnosis present

## 2012-08-30 DIAGNOSIS — Z951 Presence of aortocoronary bypass graft: Secondary | ICD-10-CM

## 2012-08-30 DIAGNOSIS — S32019A Unspecified fracture of first lumbar vertebra, initial encounter for closed fracture: Secondary | ICD-10-CM | POA: Diagnosis present

## 2012-08-30 DIAGNOSIS — E039 Hypothyroidism, unspecified: Secondary | ICD-10-CM | POA: Diagnosis present

## 2012-08-30 DIAGNOSIS — L02419 Cutaneous abscess of limb, unspecified: Secondary | ICD-10-CM | POA: Diagnosis present

## 2012-08-30 DIAGNOSIS — J449 Chronic obstructive pulmonary disease, unspecified: Secondary | ICD-10-CM | POA: Diagnosis present

## 2012-08-30 DIAGNOSIS — I5043 Acute on chronic combined systolic (congestive) and diastolic (congestive) heart failure: Secondary | ICD-10-CM

## 2012-08-30 DIAGNOSIS — Z87891 Personal history of nicotine dependence: Secondary | ICD-10-CM

## 2012-08-30 DIAGNOSIS — I5033 Acute on chronic diastolic (congestive) heart failure: Secondary | ICD-10-CM

## 2012-08-30 DIAGNOSIS — F10939 Alcohol use, unspecified with withdrawal, unspecified: Secondary | ICD-10-CM | POA: Diagnosis present

## 2012-08-30 DIAGNOSIS — D696 Thrombocytopenia, unspecified: Secondary | ICD-10-CM | POA: Diagnosis present

## 2012-08-30 DIAGNOSIS — E876 Hypokalemia: Secondary | ICD-10-CM | POA: Diagnosis present

## 2012-08-30 DIAGNOSIS — N179 Acute kidney failure, unspecified: Secondary | ICD-10-CM | POA: Diagnosis present

## 2012-08-30 DIAGNOSIS — E785 Hyperlipidemia, unspecified: Secondary | ICD-10-CM | POA: Diagnosis present

## 2012-08-30 DIAGNOSIS — I369 Nonrheumatic tricuspid valve disorder, unspecified: Secondary | ICD-10-CM

## 2012-08-30 DIAGNOSIS — W19XXXA Unspecified fall, initial encounter: Secondary | ICD-10-CM | POA: Diagnosis present

## 2012-08-30 DIAGNOSIS — J4489 Other specified chronic obstructive pulmonary disease: Secondary | ICD-10-CM | POA: Diagnosis present

## 2012-08-30 DIAGNOSIS — I130 Hypertensive heart and chronic kidney disease with heart failure and stage 1 through stage 4 chronic kidney disease, or unspecified chronic kidney disease: Principal | ICD-10-CM | POA: Diagnosis present

## 2012-08-30 DIAGNOSIS — I2510 Atherosclerotic heart disease of native coronary artery without angina pectoris: Secondary | ICD-10-CM | POA: Diagnosis present

## 2012-08-30 DIAGNOSIS — B965 Pseudomonas (aeruginosa) (mallei) (pseudomallei) as the cause of diseases classified elsewhere: Secondary | ICD-10-CM

## 2012-08-30 DIAGNOSIS — R5381 Other malaise: Secondary | ICD-10-CM | POA: Diagnosis present

## 2012-08-30 DIAGNOSIS — S32009A Unspecified fracture of unspecified lumbar vertebra, initial encounter for closed fracture: Secondary | ICD-10-CM | POA: Diagnosis present

## 2012-08-30 DIAGNOSIS — Z79899 Other long term (current) drug therapy: Secondary | ICD-10-CM

## 2012-08-30 DIAGNOSIS — I119 Hypertensive heart disease without heart failure: Secondary | ICD-10-CM | POA: Diagnosis present

## 2012-08-30 DIAGNOSIS — I4891 Unspecified atrial fibrillation: Secondary | ICD-10-CM

## 2012-08-30 DIAGNOSIS — Z91199 Patient's noncompliance with other medical treatment and regimen due to unspecified reason: Secondary | ICD-10-CM

## 2012-08-30 DIAGNOSIS — R0600 Dyspnea, unspecified: Secondary | ICD-10-CM

## 2012-08-30 DIAGNOSIS — T148XXA Other injury of unspecified body region, initial encounter: Secondary | ICD-10-CM

## 2012-08-30 DIAGNOSIS — K5900 Constipation, unspecified: Secondary | ICD-10-CM | POA: Diagnosis present

## 2012-08-30 DIAGNOSIS — E1151 Type 2 diabetes mellitus with diabetic peripheral angiopathy without gangrene: Secondary | ICD-10-CM | POA: Diagnosis present

## 2012-08-30 DIAGNOSIS — I10 Essential (primary) hypertension: Secondary | ICD-10-CM

## 2012-08-30 DIAGNOSIS — I5032 Chronic diastolic (congestive) heart failure: Secondary | ICD-10-CM

## 2012-08-30 DIAGNOSIS — G4733 Obstructive sleep apnea (adult) (pediatric): Secondary | ICD-10-CM

## 2012-08-30 DIAGNOSIS — N189 Chronic kidney disease, unspecified: Secondary | ICD-10-CM | POA: Diagnosis present

## 2012-08-30 DIAGNOSIS — N289 Disorder of kidney and ureter, unspecified: Secondary | ICD-10-CM

## 2012-08-30 DIAGNOSIS — I480 Paroxysmal atrial fibrillation: Secondary | ICD-10-CM | POA: Diagnosis present

## 2012-08-30 DIAGNOSIS — N390 Urinary tract infection, site not specified: Secondary | ICD-10-CM | POA: Diagnosis present

## 2012-08-30 DIAGNOSIS — R609 Edema, unspecified: Secondary | ICD-10-CM

## 2012-08-30 DIAGNOSIS — D1779 Benign lipomatous neoplasm of other sites: Secondary | ICD-10-CM | POA: Diagnosis present

## 2012-08-30 DIAGNOSIS — J9600 Acute respiratory failure, unspecified whether with hypoxia or hypercapnia: Secondary | ICD-10-CM

## 2012-08-30 DIAGNOSIS — R531 Weakness: Secondary | ICD-10-CM | POA: Diagnosis present

## 2012-08-30 DIAGNOSIS — Z9119 Patient's noncompliance with other medical treatment and regimen: Secondary | ICD-10-CM

## 2012-08-30 DIAGNOSIS — I6529 Occlusion and stenosis of unspecified carotid artery: Secondary | ICD-10-CM

## 2012-08-30 DIAGNOSIS — F1020 Alcohol dependence, uncomplicated: Secondary | ICD-10-CM | POA: Diagnosis present

## 2012-08-30 DIAGNOSIS — S301XXA Contusion of abdominal wall, initial encounter: Secondary | ICD-10-CM | POA: Diagnosis present

## 2012-08-30 DIAGNOSIS — D649 Anemia, unspecified: Secondary | ICD-10-CM | POA: Diagnosis present

## 2012-08-30 DIAGNOSIS — E119 Type 2 diabetes mellitus without complications: Secondary | ICD-10-CM

## 2012-08-30 DIAGNOSIS — J9601 Acute respiratory failure with hypoxia: Secondary | ICD-10-CM | POA: Diagnosis present

## 2012-08-30 DIAGNOSIS — Z7982 Long term (current) use of aspirin: Secondary | ICD-10-CM

## 2012-08-30 DIAGNOSIS — R7989 Other specified abnormal findings of blood chemistry: Secondary | ICD-10-CM | POA: Diagnosis present

## 2012-08-30 DIAGNOSIS — I2789 Other specified pulmonary heart diseases: Secondary | ICD-10-CM | POA: Diagnosis present

## 2012-08-30 DIAGNOSIS — I214 Non-ST elevation (NSTEMI) myocardial infarction: Secondary | ICD-10-CM

## 2012-08-30 HISTORY — DX: Heart failure, unspecified: I50.9

## 2012-08-30 HISTORY — DX: Cyst of kidney, acquired: N28.1

## 2012-08-30 HISTORY — DX: Repeated falls: R29.6

## 2012-08-30 HISTORY — DX: Morbid (severe) obesity due to excess calories: E66.01

## 2012-08-30 LAB — GLUCOSE, CAPILLARY
Glucose-Capillary: 78 mg/dL (ref 70–99)
Glucose-Capillary: 104 mg/dL — ABNORMAL HIGH (ref 70–99)
Glucose-Capillary: 105 mg/dL — ABNORMAL HIGH (ref 70–99)

## 2012-08-30 LAB — CBC WITH DIFFERENTIAL/PLATELET
Basophils Absolute: 0 10*3/uL (ref 0.0–0.1)
Basophils Relative: 0 % (ref 0–1)
Eosinophils Absolute: 0.2 10*3/uL (ref 0.0–0.7)
Eosinophils Relative: 2 % (ref 0–5)
MCH: 25.5 pg — ABNORMAL LOW (ref 26.0–34.0)
MCV: 89.2 fL (ref 78.0–100.0)
Neutrophils Relative %: 65 % (ref 43–77)
Platelets: 207 10*3/uL (ref 150–400)
RDW: 19.1 % — ABNORMAL HIGH (ref 11.5–15.5)
WBC: 8.1 10*3/uL (ref 4.0–10.5)

## 2012-08-30 LAB — COMPREHENSIVE METABOLIC PANEL
Alkaline Phosphatase: 99 U/L (ref 39–117)
BUN: 76 mg/dL — ABNORMAL HIGH (ref 6–23)
Creatinine, Ser: 1.93 mg/dL — ABNORMAL HIGH (ref 0.50–1.35)
GFR calc Af Amer: 39 mL/min — ABNORMAL LOW (ref >90)
Glucose, Bld: 66 mg/dL — ABNORMAL LOW (ref 70–99)
Potassium: 4.1 mEq/L (ref 3.5–5.1)
Total Bilirubin: 0.7 mg/dL (ref 0.3–1.2)
Total Protein: 7.1 g/dL (ref 6.0–8.3)

## 2012-08-30 LAB — PROTIME-INR
INR: 1.42 (ref 0.00–1.49)
Prothrombin Time: 17 seconds — ABNORMAL HIGH (ref 11.6–15.2)

## 2012-08-30 LAB — HEMOGLOBIN A1C
Hgb A1c MFr Bld: 5.8 % — ABNORMAL HIGH (ref <5.7)
Mean Plasma Glucose: 120 mg/dL — ABNORMAL HIGH (ref <117)

## 2012-08-30 LAB — PRO B NATRIURETIC PEPTIDE: Pro B Natriuretic peptide (BNP): 18070 pg/mL — ABNORMAL HIGH (ref 0–125)

## 2012-08-30 LAB — TSH: TSH: 10.089 u[IU]/mL — ABNORMAL HIGH (ref 0.350–4.500)

## 2012-08-30 MED ORDER — VITAMIN B-1 100 MG PO TABS
100.0000 mg | ORAL_TABLET | Freq: Every day | ORAL | Status: DC
  Administered 2012-08-31 – 2012-09-05 (×6): 100 mg via ORAL
  Filled 2012-08-30 (×7): qty 1

## 2012-08-30 MED ORDER — SODIUM CHLORIDE 0.9 % IJ SOLN
3.0000 mL | Freq: Two times a day (BID) | INTRAMUSCULAR | Status: DC
  Administered 2012-08-30 – 2012-09-08 (×9): 3 mL via INTRAVENOUS

## 2012-08-30 MED ORDER — FOLIC ACID 1 MG PO TABS
1.0000 mg | ORAL_TABLET | Freq: Every day | ORAL | Status: DC
  Administered 2012-08-31 – 2012-09-08 (×9): 1 mg via ORAL
  Filled 2012-08-30 (×10): qty 1

## 2012-08-30 MED ORDER — SIMVASTATIN 40 MG PO TABS
40.0000 mg | ORAL_TABLET | Freq: Every day | ORAL | Status: DC
  Administered 2012-08-30 – 2012-09-07 (×9): 40 mg via ORAL
  Filled 2012-08-30 (×11): qty 1

## 2012-08-30 MED ORDER — INSULIN ASPART 100 UNIT/ML ~~LOC~~ SOLN
0.0000 [IU] | Freq: Three times a day (TID) | SUBCUTANEOUS | Status: DC
  Administered 2012-08-31 (×2): 1 [IU] via SUBCUTANEOUS
  Administered 2012-09-01 (×2): 2 [IU] via SUBCUTANEOUS
  Administered 2012-09-01: 1 [IU] via SUBCUTANEOUS
  Administered 2012-09-02 – 2012-09-03 (×4): 2 [IU] via SUBCUTANEOUS
  Administered 2012-09-03: 1 [IU] via SUBCUTANEOUS
  Administered 2012-09-03 – 2012-09-04 (×3): 2 [IU] via SUBCUTANEOUS
  Administered 2012-09-04: 1 [IU] via SUBCUTANEOUS
  Administered 2012-09-05 – 2012-09-06 (×5): 2 [IU] via SUBCUTANEOUS
  Administered 2012-09-06: 1 [IU] via SUBCUTANEOUS
  Administered 2012-09-07: 5 [IU] via SUBCUTANEOUS
  Administered 2012-09-07 (×2): 2 [IU] via SUBCUTANEOUS
  Administered 2012-09-08: 3 [IU] via SUBCUTANEOUS
  Administered 2012-09-08: 2 [IU] via SUBCUTANEOUS

## 2012-08-30 MED ORDER — GABAPENTIN 600 MG PO TABS
300.0000 mg | ORAL_TABLET | Freq: Every day | ORAL | Status: DC
  Filled 2012-08-30: qty 0.5

## 2012-08-30 MED ORDER — HEPARIN SODIUM (PORCINE) 5000 UNIT/ML IJ SOLN
5000.0000 [IU] | Freq: Three times a day (TID) | INTRAMUSCULAR | Status: DC
  Administered 2012-08-30 – 2012-09-03 (×11): 5000 [IU] via SUBCUTANEOUS
  Filled 2012-08-30 (×16): qty 1

## 2012-08-30 MED ORDER — LEVOTHYROXINE SODIUM 150 MCG PO TABS
150.0000 ug | ORAL_TABLET | Freq: Every day | ORAL | Status: DC
  Administered 2012-08-30 – 2012-09-08 (×9): 150 ug via ORAL
  Filled 2012-08-30 (×12): qty 1

## 2012-08-30 MED ORDER — MORPHINE SULFATE 2 MG/ML IJ SOLN: 1.0000 mg | INTRAMUSCULAR | Status: DC | PRN

## 2012-08-30 MED ORDER — GABAPENTIN 300 MG PO CAPS
300.0000 mg | ORAL_CAPSULE | Freq: Every day | ORAL | Status: DC
  Administered 2012-08-30 – 2012-09-08 (×10): 300 mg via ORAL
  Filled 2012-08-30 (×10): qty 1

## 2012-08-30 MED ORDER — LORAZEPAM 1 MG PO TABS
1.0000 mg | ORAL_TABLET | Freq: Four times a day (QID) | ORAL | Status: AC | PRN
  Administered 2012-08-31: 1 mg via ORAL
  Filled 2012-08-30 (×2): qty 1

## 2012-08-30 MED ORDER — OXYCODONE-ACETAMINOPHEN 5-325 MG PO TABS
1.0000 | ORAL_TABLET | Freq: Four times a day (QID) | ORAL | Status: DC | PRN
  Administered 2012-08-30 – 2012-08-31 (×2): 2 via ORAL
  Administered 2012-09-01: 1 via ORAL
  Administered 2012-09-01 – 2012-09-07 (×15): 2 via ORAL
  Administered 2012-09-07: 1 via ORAL
  Administered 2012-09-07: 2 via ORAL
  Administered 2012-09-07: 1 via ORAL
  Administered 2012-09-08: 2 via ORAL
  Filled 2012-08-30 (×3): qty 2
  Filled 2012-08-30: qty 1
  Filled 2012-08-30 (×10): qty 2
  Filled 2012-08-30: qty 1
  Filled 2012-08-30 (×3): qty 2
  Filled 2012-08-30: qty 1
  Filled 2012-08-30 (×5): qty 2

## 2012-08-30 MED ORDER — FLUOXETINE HCL 20 MG PO CAPS
20.0000 mg | ORAL_CAPSULE | Freq: Every day | ORAL | Status: DC
  Administered 2012-08-30 – 2012-09-08 (×10): 20 mg via ORAL
  Filled 2012-08-30 (×10): qty 1

## 2012-08-30 MED ORDER — THIAMINE HCL 100 MG/ML IJ SOLN
100.0000 mg | Freq: Every day | INTRAMUSCULAR | Status: DC
  Filled 2012-08-30: qty 1

## 2012-08-30 MED ORDER — SODIUM CHLORIDE 0.9 % IJ SOLN
3.0000 mL | INTRAMUSCULAR | Status: DC | PRN
  Administered 2012-09-02: 3 mL via INTRAVENOUS

## 2012-08-30 MED ORDER — ADULT MULTIVITAMIN W/MINERALS CH
1.0000 | ORAL_TABLET | Freq: Every day | ORAL | Status: DC
  Administered 2012-08-31 – 2012-09-08 (×9): 1 via ORAL
  Filled 2012-08-30 (×10): qty 1

## 2012-08-30 MED ORDER — FUROSEMIDE 10 MG/ML IJ SOLN
40.0000 mg | Freq: Two times a day (BID) | INTRAMUSCULAR | Status: DC
  Administered 2012-08-30 – 2012-08-31 (×4): 40 mg via INTRAVENOUS
  Filled 2012-08-30 (×5): qty 4

## 2012-08-30 MED ORDER — ONDANSETRON HCL 4 MG/2ML IJ SOLN: 4.0000 mg | Freq: Four times a day (QID) | INTRAMUSCULAR | Status: DC | PRN

## 2012-08-30 MED ORDER — LACTULOSE 10 GM/15ML PO SOLN
20.0000 g | Freq: Three times a day (TID) | ORAL | Status: DC
  Administered 2012-08-30 (×2): 20 g via ORAL
  Filled 2012-08-30 (×6): qty 30

## 2012-08-30 MED ORDER — LORAZEPAM 2 MG/ML IJ SOLN
1.0000 mg | Freq: Four times a day (QID) | INTRAMUSCULAR | Status: AC | PRN
  Administered 2012-08-30: 1 mg via INTRAVENOUS
  Filled 2012-08-30: qty 1

## 2012-08-30 MED ORDER — ASPIRIN 325 MG PO TABS
325.0000 mg | ORAL_TABLET | Freq: Every day | ORAL | Status: DC
  Administered 2012-08-30 – 2012-09-08 (×10): 325 mg via ORAL
  Filled 2012-08-30 (×10): qty 1

## 2012-08-30 MED ORDER — SODIUM CHLORIDE 0.9 % IV SOLN: 250.0000 mL | INTRAVENOUS | Status: DC | PRN

## 2012-08-30 MED ORDER — ACETAMINOPHEN 325 MG PO TABS: 650.0000 mg | ORAL_TABLET | ORAL | Status: DC | PRN

## 2012-08-30 MED ORDER — FUROSEMIDE 10 MG/ML IJ SOLN
40.0000 mg | Freq: Every day | INTRAMUSCULAR | Status: DC
  Filled 2012-08-30: qty 4

## 2012-08-30 NOTE — H&P (Signed)
PCP:   [name], MD   Chief Complaint:  confusion  HPI: 66 yo male presented to [facility] with ams with dtr h/o chf, copd, etoh abuse, ckd, cafib, freq falls with recent large hematoma to flank area rt found to be in chf exac there and worsening renal failure with a trop of 0.78.  ekg afib cont rate noacute changes.  Pt has no complaints but is somnulent.  Hypoxic in there ed with oxygen sats initially 85% on RA.  Pt denies any cp sob.  Has been having le edema.  dtr reported that he has not been taking his meds as he is suppose too.  O/w ros and history is unreliable from pt at this point  Review of Systems:  O/w unobtainable  Past Medical History: Past Medical History  Diagnosis Date  . CHRONIC OBSTRUCTIVE PULMONARY DISEASE 06/20/2009  . OBSTRUCTIVE SLEEP APNEA 06/20/2009  . CAROTID STENOSIS 06/20/2009    A. 08/2001 s/p L CEA;  B.   09/14/11 - Carotid U/S - 40-59% bilateral stenosis, left CEA patch angioplasty is patent  . DM 06/20/2009  . CAD 06/20/2009    A.  08/2000 - s/p CABG x 4 - LIMA-LAD, Left Radial-OM, VG-DIAG, VG-RCA;  B. Neg. MV  2010  . HYPERLIPIDEMIA 06/20/2009  . HYPERTENSION 06/20/2009  . Overweight   . Hypothyroidism   . Low back pain   . Asthma     as child  . Pneumonia   . Atrial fibrillation     Not felt to be coumadin candidate 2/2 ETOH use.  Marland Kitchen Hypothyroidism   . ETOH abuse   . History of tobacco abuse     remote - quit 1970   Past Surgical History  Procedure Date  . Carotid endarterectomy 2002    left  . Coronary artery bypass graft     x 4 - 2001    Medications: Prior to Admission medications   Medication Sig Start Date End Date Taking? Authorizing Provider  allopurinol (ZYLOPRIM) 300 MG tablet Take 300 mg by mouth daily.      Historical Provider, MD  aspirin 325 MG tablet Take 325 mg by mouth daily.      Historical Provider, MD  FLUoxetine (PROZAC) 20 MG capsule Take 20 mg by mouth daily.      Historical Provider, MD  fluticasone (FLOVENT  HFA) 220 MCG/ACT inhaler Inhale 1 puff into the lungs 2 (two) times daily.      Historical Provider, MD  furosemide (LASIX) 80 MG tablet Take 1 tablet (80 mg total) by mouth daily. 03/07/12 03/07/13  Kathleene Hazel, MD  glipiZIDE (GLUCOTROL) 10 MG tablet Take 10 mg by mouth 2 (two) times daily before a meal.    Historical Provider, MD  HYDROmorphone (DILAUDID) 2 MG tablet Take 1 tablet (2 mg total) by mouth every 8 (eight) hours as needed. 03/07/12   Kathleene Hazel, MD  lactulose (CHRONULAC) 10 GM/15ML solution Take 20 g by mouth 3 (three) times daily. As needed    Historical Provider, MD  levothyroxine (SYNTHROID, LEVOTHROID) 150 MCG tablet Take 150 mcg by mouth daily.      Historical Provider, MD  LORazepam (ATIVAN) 1 MG tablet Take 1 mg by mouth every 8 (eight) hours.    Historical Provider, MD  magnesium oxide (MAG-OX) 400 MG tablet Take 2 pills daily    Historical Provider, MD  metoprolol (LOPRESSOR) 50 MG tablet As directed  1 tab daily at this present time  Historical Provider, MD  Multiple Vitamin (MULTIVITAMIN) capsule Take 1 capsule by mouth daily.    Historical Provider, MD  omeprazole (PRILOSEC) 20 MG capsule Take 20 mg by mouth daily.      Historical Provider, MD  potassium chloride (KLOR-CON) 20 MEQ packet 1 tab daily    Historical Provider, MD  senna (SENOKOT) 8.6 MG tablet Take 1 tablet by mouth daily. As needed    Historical Provider, MD  simvastatin (ZOCOR) 40 MG tablet Take 40 mg by mouth at bedtime.      Historical Provider, MD  spironolactone (ALDACTONE) 50 MG tablet 50 mg. ON HOLD 5 /28 /2013    Historical Provider, MD  traZODone (DESYREL) 50 MG tablet Take 50 mg by mouth at bedtime.    Historical Provider, MD    Allergies:   Allergies  Allergen Reactions  . Erythromycin     Took as a child for tooth ache and unable to recall reaction    Social History:  reports that he quit smoking about 43 years ago. He does not have any smokeless tobacco history on  file. He reports that he drinks alcohol. He reports that he uses illicit drugs (Marijuana).  Family History: Family History  Problem Relation Age of Onset  . Stroke Mother     ?  . Stroke Father     ?    Physical Exam: Filed Vitals:   08/30/12 0308  BP: 124/65  Pulse: 71  Temp: 97.4 F (36.3 C)  TempSrc: Oral  Resp: 18  Height: 5\' 9"  (1.753 m)  Weight: 126.1 kg (278 lb)  SpO2: 100%   General appearance: alert, cooperative and no distress disheveled and chronically ill appearing Neck: no JVD and supple, symmetrical, trachea midline Lungs: crackles at bases with good air movement no w/r/r Heart: irregularly irregular rhythm Abdomen: soft, non-tender; bowel sounds normal; no masses,  no organomegaly Extremities: edema 2+ble Pulses: 2+ and symmetric Skin: Skin color, texture, turgor normal. No rashes or lesions Neurologic: Grossly normal  Slowed mentation no focal neuro deficitis    Labs on Admission:  All reviewed from outside facility.  lfts elevated cr from 1.7 to 2.5 bun from 61 to 78 in the last 2 wks.  hgb around 8-9 pbnp 29000 trop 0.78, tsh 12.1  inr 1.59  cth neg acute issues.  No ammonia level cked uds neg.  etoh <3  cxr vascular congestion poor quality.  Wbc normal.  Radiological Exams on Admission: No results found.  Assessment/Plan 66 yo male with confusion likely due to combination of worsening chf with worsening renal failure, uncontrolled hypothyroidism, and unclear nstemi and medical noncompliance with meds  Principal Problem:  *CHF exacerbation Active Problems:  HYPERTENSION  CHRONIC OBSTRUCTIVE PULMONARY DISEASE  CAD  Atrial fibrillation  Hematoma-on back, sacral area, right chest wall post fall  Acute renal failure  Weakness generalized  Hypothyroidism  Acute respiratory failure with hypoxia  Elevated troponin  Elevated LFTs  Serial his enzymes if trop elevates any further place on full dose lovenox.  ??ck ammonia level ?underlying  cirrhosis.  chf pathway 2 d echo in am.  Ck etoh level.  Lasix iv.  Repeat labs in am including lfts.  Place back on synthroid.  Neuro cks.  Would consult cardiology if no improvement.  Monitor renal function closely.  Theodora Lalanne A 08/30/2012, 4:12 AM

## 2012-08-30 NOTE — Consult Note (Signed)
Cardiology Consult Note   Patient ID: [name] MRN: [id number], DOB/AGE: 16-Sep-1946   Admit date: 08/30/2012 Date of Consult: 08/30/2012  Primary Physician: [name], MD Primary Cardiologist: Earney Hamburg, MD  Reason for consult: evaluation/management of CHF  HPI: [name] is a 66yo male with PMX s/f CAD (s/p CABG x 4 in 2001, normal Myoview 2010), chronic diastolic CHF, persistent a-fib, DM, HTN, HL, carotid artery disease, COPD, hypothyroidism, EtOH abuse, chronic venous insufficiency and morbid obesity who was admitted by the medicine service yesterday for acute on chronic diastolic CHF.   He has been followed by Dr. Juanda Chance, then Dr. Clifton James on 02/2011 for CHF, CAD, and a-fib. Not felt to be a Coumadin candidate d/t EtOH abuse and fall risk. Has had intermittent issues with CHF. Hospitalized 09/2011 for CHF, type 2 NSTEMI and AMS in setting of PNA. Ald antagonist and metolazone stopped, Lasix reduced 2/2 hypotension. Stable last visit on 03/07/12.   Echo 09/2011: LVEF 45-50%, mod biatral enlargement, moderate RV dilatation, moderate TR, PASP 66 mmHg.   History limited due to altered mental status. Reports increased bilateral leg swelling. He is limited in activity and uses a walker to ambulate. No chest pain, palpitations, lightheadedness, syncope, PND or orthopnea. Has not been taking daily weights. Consumes "a couple drinks or a pint" of acohol several days/week. Does report medication compliance. Does eat out/consume processed foods on occasion. He reports experiencing a mechanical fall the date of admission for which he presented to [facility].  There, EKG course a-fib without ischemic changes. UDS -. Etoh elevated. BUN/Cr 78/2.5 (baseline Cr 0.9). Trop-I 0.078. He was started on IV Lasix and transferred. pBNP on arrival 18070.0. Trop-I WNL. BUN/Cr 76/1.93. Do not see CXR. Echo pending. I/O - 800 cc. Baseline weight 244 lbs. Currently 278 lbs.   Problem  List: Past Medical History  Diagnosis Date  . CHRONIC OBSTRUCTIVE PULMONARY DISEASE 06/20/2009  . OBSTRUCTIVE SLEEP APNEA 06/20/2009  . CAROTID STENOSIS 06/20/2009    A. 08/2001 s/p L CEA;  B.   09/14/11 - Carotid U/S - 40-59% bilateral stenosis, left CEA patch angioplasty is patent  . DM 06/20/2009  . CAD 06/20/2009    A.  08/2000 - s/p CABG x 4 - LIMA-LAD, Left Radial-OM, VG-DIAG, VG-RCA;  B. Neg. MV  2010  . HYPERLIPIDEMIA 06/20/2009  . HYPERTENSION 06/20/2009  . Overweight   . Hypothyroidism   . Low back pain   . Asthma     as child  . Pneumonia   . Atrial fibrillation     Not felt to be coumadin candidate 2/2 ETOH use.  Marland Kitchen Hypothyroidism   . ETOH abuse   . History of tobacco abuse     remote - quit 1970    Past Surgical History  Procedure Date  . Carotid endarterectomy 2002    left  . Coronary artery bypass graft     x 4 - 2001     Allergies:  Allergies  Allergen Reactions  . Erythromycin Hives    Home Medications: Prior to Admission medications   Medication Sig Start Date End Date Taking? Authorizing Provider  aspirin 325 MG tablet Take 325 mg by mouth daily.     Yes Historical Provider, MD  cephALEXin (KEFLEX) 500 MG capsule Take 500 mg by mouth 3 (three) times daily.  08/22/12  Yes Historical Provider, MD  FLUoxetine (PROZAC) 20 MG capsule Take 20 mg by mouth daily.     Yes Historical Provider, MD  fluticasone (  FLOVENT HFA) 220 MCG/ACT inhaler Inhale 1 puff into the lungs 2 (two) times daily.     Yes Historical Provider, MD  furosemide (LASIX) 80 MG tablet Take 1 tablet (80 mg total) by mouth daily. 03/07/12 03/07/13 Yes Kathleene Hazel, MD  gabapentin (NEURONTIN) 100 MG capsule Take 100-200 mg by mouth 2 (two) times daily. Takes 1 tab in the morning and 2 at bedtime 07/31/12  Yes Historical Provider, MD  glipiZIDE (GLUCOTROL) 10 MG tablet Take 10 mg by mouth 2 (two) times daily before a meal.   Yes Historical Provider, MD  HYDROmorphone (DILAUDID) 2 MG tablet  Take 2-4 mg by mouth every 4 (four) hours as needed. pain   Yes Historical Provider, MD  KLOR-CON M20 20 MEQ tablet 40 mEq daily.  08/17/12  Yes Historical Provider, MD  levothyroxine (SYNTHROID, LEVOTHROID) 150 MCG tablet Take 150 mcg by mouth daily.     Yes Historical Provider, MD  LORazepam (ATIVAN) 1 MG tablet Take 1 mg by mouth every 8 (eight) hours as needed. anxiety   Yes Historical Provider, MD  magnesium oxide (MAG-OX) 400 MG tablet Take 400 mg by mouth 2 (two) times daily.   Yes Historical Provider, MD  metoprolol (LOPRESSOR) 50 MG tablet Take 50 mg by mouth daily.   Yes Historical Provider, MD  Multiple Vitamin (MULTIVITAMIN) capsule Take 1 capsule by mouth daily.   Yes Historical Provider, MD  omeprazole (PRILOSEC) 20 MG capsule Take 20 mg by mouth daily.     Yes Historical Provider, MD  senna (SENOKOT) 8.6 MG tablet Take 1 tablet by mouth daily. As needed   Yes Historical Provider, MD  simvastatin (ZOCOR) 40 MG tablet Take 40 mg by mouth at bedtime.     Yes Historical Provider, MD  traZODone (DESYREL) 50 MG tablet Take 50-100 mg by mouth at bedtime as needed. sleep   Yes Historical Provider, MD    Inpatient Medications:     . aspirin  325 mg Oral Daily  . FLUoxetine  20 mg Oral Daily  . furosemide  40 mg Intravenous BID  . gabapentin  300 mg Oral Daily  . heparin subcutaneous  5,000 Units Subcutaneous Q8H  . insulin aspart  0-9 Units Subcutaneous TID WC  . lactulose  20 g Oral TID  . levothyroxine  150 mcg Oral QAC breakfast  . simvastatin  40 mg Oral QHS  . sodium chloride  3 mL Intravenous Q12H  . [DISCONTINUED] furosemide  40 mg Intravenous Daily  . [DISCONTINUED] gabapentin  300 mg Oral Daily   Prescriptions prior to admission  Medication Sig Dispense Refill  . aspirin 325 MG tablet Take 325 mg by mouth daily.        . cephALEXin (KEFLEX) 500 MG capsule Take 500 mg by mouth 3 (three) times daily.       Marland Kitchen FLUoxetine (PROZAC) 20 MG capsule Take 20 mg by mouth daily.         . fluticasone (FLOVENT HFA) 220 MCG/ACT inhaler Inhale 1 puff into the lungs 2 (two) times daily.        . furosemide (LASIX) 80 MG tablet Take 1 tablet (80 mg total) by mouth daily.  90 tablet  3  . gabapentin (NEURONTIN) 100 MG capsule Take 100-200 mg by mouth 2 (two) times daily. Takes 1 tab in the morning and 2 at bedtime      . glipiZIDE (GLUCOTROL) 10 MG tablet Take 10 mg by mouth 2 (two) times daily before a  meal.      . HYDROmorphone (DILAUDID) 2 MG tablet Take 2-4 mg by mouth every 4 (four) hours as needed. pain      . KLOR-CON M20 20 MEQ tablet 40 mEq daily.       Marland Kitchen levothyroxine (SYNTHROID, LEVOTHROID) 150 MCG tablet Take 150 mcg by mouth daily.        Marland Kitchen LORazepam (ATIVAN) 1 MG tablet Take 1 mg by mouth every 8 (eight) hours as needed. anxiety      . magnesium oxide (MAG-OX) 400 MG tablet Take 400 mg by mouth 2 (two) times daily.      . metoprolol (LOPRESSOR) 50 MG tablet Take 50 mg by mouth daily.      . Multiple Vitamin (MULTIVITAMIN) capsule Take 1 capsule by mouth daily.      Marland Kitchen omeprazole (PRILOSEC) 20 MG capsule Take 20 mg by mouth daily.        Marland Kitchen senna (SENOKOT) 8.6 MG tablet Take 1 tablet by mouth daily. As needed      . simvastatin (ZOCOR) 40 MG tablet Take 40 mg by mouth at bedtime.        . traZODone (DESYREL) 50 MG tablet Take 50-100 mg by mouth at bedtime as needed. sleep        Family History  Problem Relation Age of Onset  . Stroke Mother     ?  . Stroke Father     ?     History   Social History  . Marital Status: Widowed    Spouse Name: N/A    Number of Children: N/A  . Years of Education: N/A   Occupational History  . retired    Social History Main Topics  . Smoking status: Former Smoker    Quit date: 10/11/1968  . Smokeless tobacco: Not on file  . Alcohol Use: Yes     Comment: wild [place] daily;  also reports regularly drinking a pint of vodka.  . Drug Use: Yes    Special: Marijuana     Comment: last 1 year ago  . Sexually Active: Not on  file   Other Topics Concern  . Not on file   Social History Narrative   Lives in [place] with dtr, son-in-law     Review of Systems: General: negative for chills, fever, night sweats or weight changes.  Cardiovascular:  Positive for edemea, negative for chest pain, dyspnea on exertion, orthopnea, palpitations, paroxysmal nocturnal dyspnea or shortness of breath Dermatological: negative for rash Respiratory: negative for cough or wheezing Urologic: negative for hematuria Abdominal:  negative for nausea, vomiting, diarrhea, bright red blood per rectum, melena, or hematemesis Neurologic: negative for visual changes, syncope, or dizziness All other systems reviewed and are otherwise negative except as noted above.  Physical Exam: Blood pressure 124/65, pulse 71, temperature 97.4 F (36.3 C), temperature source Oral, resp. rate 18, height 5\' 9"  (1.753 m), weight 126.1 kg (278 lb), SpO2 100.00%.    General: Obese, somnolent, appears older than stated age, well developed, in no acute distress. Head: Normocephalic, atraumatic, sclera non-icteric, no xanthomas, nares are without discharge.  Neck: Negative for carotid bruits. JVP to earlobes.  Lungs: Anterior lung zones with bibasilar rales. No rhonchi or wheezing. No increased respiratory effort.  Heart: Irregularly irregular, normal rate, with S1 S2. No murmurs, rubs, or gallops appreciated. Abdomen: Soft, non-tender, non-distended with normoactive bowel sounds. No hepatomegaly. No rebound/guarding. No obvious abdominal masses. Msk:  Strength and tone appears normal for age. Extremities:  2+ pitting edema to knee/inferior thighs bilaterally. Induration and healing ulcerations noted.  No clubbing, cyanosis.  Distal pedal pulses are 1+ and equal bilaterally. Neuro: Alert and oriented X 2 to person and place. Moves all extremitie spontaneously. Psych: Delayed response to questions with a flat affect.  Labs: Recent Labs  Basename 08/30/12  0430   WBC 8.1   HGB 9.0*   HCT 31.5*   MCV 89.2   PLT 207    Lab 08/30/12 0430  NA 142  K 4.1  CL 99  CO2 33*  BUN 76*  CREATININE 1.93*  CALCIUM 9.2  PROT 7.1  BILITOT 0.7  ALKPHOS 99  ALT 112*  AST 191*  AMYLASE --  LIPASE --  GLUCOSE 66*   Recent Labs  Basename 08/30/12 0416   CKTOTAL --   CKMB --   CKMBINDEX --   TROPONINI <0.30   Radiology/Studies: US Renal  08/30/2012  *RADIOLOGY REPORT*  Clinical Data:  Acute renal insufficiency.  Diabetes and hypertension.  RENAL/URINARY TRACT ULTRASOUND COMPLETE  Comparison:  07/27/2012  Findings:  Right Kidney:  Measures 13.6 cm.  Normal in size and parenchymal echogenicity.  No evidence of mass or hydronephrosis. Cyst within the upper pole of the right kidney measures 3.2 x 2.7 x 3.6 cm.  Left Kidney:  Measures 14.5 cm.  Normal in size and parenchymal echogenicity.  No evidence of mass or hydronephrosis. There is a 2.6 x 2.0 x 2.2 cm cyst in the upper pole. A 3.3 x 2.75-0.4 cm cyst is noted in the inferior pole.  Bladder:  Not visualize secondary to postvoid state.  IMPRESSION:  1.  No obstructive uropathy. 2.  Bilateral renal cysts.   Original Report Authenticated By: Signa Kell, M.D.    EKG: course atrial fibrillation, 79 bpm, low voltage QRS, no ST/T changes  ASSESSMENT AND PLAN:   1. Acute on chronic combined CHF- likely multifactorial against a background of morbid obesity. He reports ongoing EtOH abuse and dietary indiscretion. No endorsement of chest pain or palpitations. Has been compliant with medications. Takes Lasix daily. Continue IV diuresis- bump up to Lasix 60mg  IV BID. Monitor I/Os, daily weights. Will need to consider increasing Lasix back to 160 mg daily +/- restarting metolazone once euvolemic to maintain this as an outpatient. He will need heart failure education. EtOH cessation is essential. Echo & CXR pending. Will need to determine if this is more R-heart related given underlying obesity, COPD and likely  OHS with resultant pHTN and structural R heart disease on 2012 echo. Continue ASA, BB. Reinitiate aldosterone antagonist once renal function improves and as BP allows.   2. Persistent atrial fibrillation- noted on telemetry and [facility] from [facility]. Rate-controlled. No evidence of RVR for which to attribute CHF exacerbation. Continue to monitor. Agree with adjusting BB should rate-control be needed. CHADS2 = 3 however deemed poor Coumadin candidate 2/2 EtOH abuse and frequent falls.   3. Elevated troponin-I- at [facility]. WNL on transfer. Continue to trend and formally rule out. Mild elevation at OSH may be representative of demand ischemia, or as a result of impaired excretion given ARF. Will continue to monitor.   4. CAD- stable. No endorsement of chest pain. EKG does not indicate ischemia. Troponin-I on transfer, as above, was WNL. Consider repeat Myoview given underlying CAD and to r/o silent ischemia once stable, especially if reduced EF from prior echo and/or WMA is apparent. Continue ASA, BB, statin.   5. EtOH abuse- transaminitis noted on CMET  on transfer- may be result of EtOH abuse and/or hepatic congestion from CHF. EtOH + on labwork at OSH. Cessation will be vital going forward. Place CIWA protocol.  6. Acute renal insufficiency- likely secondary volume overload as it has improved with diuresis. Will continue to monitor as he diureses further. Renal u/s noted bilateral renal cysts, no evidence of obstruction.   7. Normocytic anemia- likely d/t longstanding EtOH abuse.   8. Hypothyroidism- continue Synthroid per primary team.   9. Hematoma- recently diagnosed secondary to falls. Continue to monitor H/H.   10. Altered mental status- per primary team. No focal neurological deficits. Primary team considering underlying alcoholic cirrhosis/hepatic encephalopathy with elevated LFTs and EtOH abuse. Lactulose being administered. Does endorse visual hallucinations. Glucose a little  low this AM as well. Continue to watch. No evidence of fever or leukocytosis. Check CXR (presented similarly last year in setting of PNA) and u/a to rule out underlying infection attributing to this. CIWA protocol placed as above.   Patient states he lives at home with his daughter. With frequent falls and alcohol use, will need to discuss consideration of home health assistance or ALF with family if he is unable to be cared for.    Signed, R. Hurman Horn, PA-C 08/30/2012, 12:54 PM  The patient was seen on 2000 with Hurman Horn PA-C.  The patient himself is a poor historian.  He states that he lives with his daughter and her husband.  He is vague about the type of diet that he consumes.  He does admit to drinking moderate amount of whiskey on a fairly regular basis.  He presents now with evidence of severe volume overload with marked bilater lower extremity peripheral edema.  He is having visual hallucinations and will be started on the CIWA protocol.  His initial troponin at [facility] was of borderline significance and subsequent troponins here have been normal x2.  We'll continue to monitor but we do not anticipate the need for any invasive workup during this admission.  On physical examination he is disheveled and mildly confused.  The heart reveals no S3 gallop.  The rhythm is irregular in atrial fibrillation with a controlled ventricular response.  The abdomen is nontender.   The extremities show marked bilateral 4+ pitting edema with multiple small ulcerations.  Pedal pulses are present. Agree with assessment and plan as noted by Mr. Arguello above.  Chest x-ray not yet available for review.

## 2012-08-30 NOTE — Progress Notes (Signed)
Inpatient Diabetes Program Recommendations  AACE/ADA: New Consensus Statement on Inpatient Glycemic Control (2013)  Target Ranges:  Prepandial:   less than 140 mg/dL      Peak postprandial:   less than 180 mg/dL (1-2 hours)      Critically ill patients:  140 - 180 mg/dL   Reason for Visit: Note history of diabetes.  According to medication reconciliation patient was taking Glucotrol 10 mg bid.  Please check CBG's tid with meals and HS.  Note lab glucose this morning was 66 mg/dL. Also consider adding "Diabetes" to problem list.

## 2012-08-30 NOTE — Progress Notes (Signed)
UR Completed.  [name] [phone] 08/30/2012  

## 2012-08-30 NOTE — Progress Notes (Signed)
  Echocardiogram 2D Echocardiogram has been performed.  [name] 08/30/2012, 3:22 PM

## 2012-08-30 NOTE — Progress Notes (Signed)
[facility] PROGRESS NOTE  [name] OZH:[id number] DOB: [birth year] DOA: 08/30/2012 PCP: [name], MD  Assessment/Plan:  Diastolic heart failure, acute on chronic exacerbation:  Dry weight is around 244 lbs and currently 278lbs.  Initial troponin elevation likely due to heart strain as was the case in 09/2011.   -  F/u ECHO -  Continue diuresis, but increase to 40mg  IV BID -  Frequent electrolytes -  Consider restarting beta blocker - defer to cardiology  CAD s/p 4V CABG 2001, neg myoview 2010.  Mild elevation in initial troponin, now normalized.  Possible NSTEMI.   -  Continue asa, statin,  -  Cardiology consultation  Atrial fibrillation, HR rising gradually this morning and currently around 100bpm on telemetry.  2 pauses of ~1.8sec each -  CHADS2 of 3, but not on warfarin due to alcohol use -  Continue ASA -  If rate increases, consider restarting metoprolol  AKI, baseline creatinine 0.9, currently 1.93 -  Renal US -  FEurea -  Increase diuresis to 40mg  IV BID -  Trend electrolytes  Transaminitis:  AST: ALT almost 2:1.  May be due to alcohol use yesterday or heart failure -  Trend and if not resolving with abstinence from alcohol and diuresis, consider viral hep panel  Anemia, normocytic, chronic, but hemoglobin slightly lower than baseline - patient had recent fall with palpable hematoma on left flank with extensive bruising.   -  Trend hgb -  Tx for hemoglobin <7 or sooner if sx  Hypothyroidism:   -  TSH -  Continue synthroid  Pain, primarily along left flank hematoma -  Percocet as needed -  Morphine for breakthrough pain  Multiple abrasions with small amount of erythema -  Monitor for cellulitis but hold ABx at this time.    DIET:  Healthy heart ACCESS:  PIV IVF:  None PROPH:  heparin   Code Status: Full code Family Communication: spoke with patient Disposition Plan: pending diuresis, kidney and liver function  improving   Consultants:  Cardiology  Procedures:  None  Antibiotics:  None  HPI/Subjective:  Denies chest pain, but has SOB with ambulation [name] distances and increased LEE recently.  Denies abdominal pain, nausea, diarrhea, constipation.  Eating okay.  Pain in left flank and some that radiates along left leg.    Objective: Filed Vitals:   08/30/12 0308  BP: 124/65  Pulse: 71  Temp: 97.4 F (36.3 C)  TempSrc: Oral  Resp: 18  Height: 5\' 9"  (1.753 m)  Weight: 126.1 kg (278 lb)  SpO2: 100%    Intake/Output Summary (Last 24 hours) at 08/30/12 0934 Last data filed at 08/30/12 0606  Gross per 24 hour  Intake      0 ml  Output    800 ml  Net   -800 ml   Filed Weights   08/30/12 0308  Weight: 126.1 kg (278 lb)    Exam:   General:  CM, no acute distress  HEENT:  MMM  Neck:  JVP to preauricular area  Cardiovascular:  Irregularly irregular, HR around 90s.  No m/r/g  Respiratory: Rales bilateral bases without rhonchi or wheeze  Abdomen: NABS, soft, nondistended, nontender  MSK:  2+ pitting edema bilateral lower extremities  SKin:  10x15cm hematoma left flank with extensive bruising.  Multiple cuts and abrasions on lower extremities with mild surrounding erythema and evidence fo venous stasis, but no overt cellulitis.    Data Reviewed: Basic Metabolic Panel:  Lab 08/30/12 6295  NA  142  K 4.1  CL 99  CO2 33*  GLUCOSE 66*  BUN 76*  CREATININE 1.93*  CALCIUM 9.2  MG --  PHOS --   Liver Function Tests:  Lab 08/30/12 0430  AST 191*  ALT 112*  ALKPHOS 99  BILITOT 0.7  PROT 7.1  ALBUMIN 3.1*   No results found for this basename: LIPASE:5,AMYLASE:5 in the last 168 hours  Lab 08/30/12 0430  AMMONIA 27   CBC:  Lab 08/30/12 0430  WBC 8.1  NEUTROABS 5.3  HGB 9.0*  HCT 31.5*  MCV 89.2  PLT 207   Cardiac Enzymes:  Lab 08/30/12 0416  CKTOTAL --  CKMB --  CKMBINDEX --  TROPONINI <0.30   BNP (last 3 results)  Basename 08/30/12 0416  10/13/11 0552 10/07/11 0407  PROBNP 18070.0* 5170.0* 14242.0*   CBG: No results found for this basename: GLUCAP:5 in the last 168 hours  No results found for this or any previous visit (from the past 240 hour(s)).   Studies: No results found.  Scheduled Meds:   . aspirin  325 mg Oral Daily  . FLUoxetine  20 mg Oral Daily  . furosemide  40 mg Intravenous Daily  . lactulose  20 g Oral TID  . levothyroxine  150 mcg Oral QAC breakfast  . simvastatin  40 mg Oral QHS  . sodium chloride  3 mL Intravenous Q12H   Continuous Infusions:   Principal Problem:  *CHF exacerbation Active Problems:  HYPERTENSION  CHRONIC OBSTRUCTIVE PULMONARY DISEASE  CAD  Atrial fibrillation  Hematoma-on back, sacral area, right chest wall post fall  Acute renal failure  Weakness generalized  Hypothyroidism  Acute respiratory failure with hypoxia  Elevated troponin  Elevated LFTs    Time spent: 30    [name], [facility]  [facility] Pager [phone]. If 8PM-8AM, please contact night-coverage at [email or web address], password [facility] 08/30/2012, 9:34 AM  LOS: 0 days

## 2012-08-31 ENCOUNTER — Inpatient Hospital Stay (HOSPITAL_COMMUNITY): Payer: Medicare Other

## 2012-08-31 ENCOUNTER — Encounter (HOSPITAL_COMMUNITY): Payer: Self-pay | Admitting: *Deleted

## 2012-08-31 DIAGNOSIS — I5033 Acute on chronic diastolic (congestive) heart failure: Secondary | ICD-10-CM

## 2012-08-31 DIAGNOSIS — R0989 Other specified symptoms and signs involving the circulatory and respiratory systems: Secondary | ICD-10-CM

## 2012-08-31 DIAGNOSIS — R609 Edema, unspecified: Secondary | ICD-10-CM

## 2012-08-31 LAB — CBC
Hemoglobin: 8.7 g/dL — ABNORMAL LOW (ref 13.0–17.0)
MCHC: 28.2 g/dL — ABNORMAL LOW (ref 30.0–36.0)
Platelets: 177 10*3/uL (ref 150–400)
RBC: 3.51 MIL/uL — ABNORMAL LOW (ref 4.22–5.81)

## 2012-08-31 LAB — COMPREHENSIVE METABOLIC PANEL
ALT: 96 U/L — ABNORMAL HIGH (ref 0–53)
AST: 128 U/L — ABNORMAL HIGH (ref 0–37)
Alkaline Phosphatase: 87 U/L (ref 39–117)
CO2: 33 mEq/L — ABNORMAL HIGH (ref 19–32)
Calcium: 9.3 mg/dL (ref 8.4–10.5)
GFR calc Af Amer: 60 mL/min — ABNORMAL LOW (ref >90)
Glucose, Bld: 113 mg/dL — ABNORMAL HIGH (ref 70–99)
Potassium: 3.2 mEq/L — ABNORMAL LOW (ref 3.5–5.1)
Sodium: 142 mEq/L (ref 135–145)
Total Protein: 6.7 g/dL (ref 6.0–8.3)

## 2012-08-31 LAB — GLUCOSE, CAPILLARY
Glucose-Capillary: 139 mg/dL — ABNORMAL HIGH (ref 70–99)
Glucose-Capillary: 138 mg/dL — ABNORMAL HIGH (ref 70–99)

## 2012-08-31 LAB — CREATININE, URINE, RANDOM: Creatinine, Urine: 27.26 mg/dL

## 2012-08-31 LAB — UREA NITROGEN, URINE: Urea Nitrogen, Ur: 288 mg/dL

## 2012-08-31 MED ORDER — POTASSIUM CHLORIDE CRYS ER 20 MEQ PO TBCR
60.0000 meq | EXTENDED_RELEASE_TABLET | Freq: Once | ORAL | Status: AC
  Administered 2012-08-31: 60 meq via ORAL
  Filled 2012-08-31: qty 3

## 2012-08-31 MED ORDER — POLYETHYLENE GLYCOL 3350 17 G PO PACK
17.0000 g | PACK | Freq: Every day | ORAL | Status: DC | PRN
  Filled 2012-08-31: qty 1

## 2012-08-31 MED ORDER — POTASSIUM CHLORIDE CRYS ER 20 MEQ PO TBCR
20.0000 meq | EXTENDED_RELEASE_TABLET | Freq: Two times a day (BID) | ORAL | Status: DC
  Administered 2012-08-31 – 2012-09-04 (×9): 20 meq via ORAL
  Filled 2012-08-31 (×15): qty 1

## 2012-08-31 MED ORDER — CARVEDILOL 6.25 MG PO TABS
6.2500 mg | ORAL_TABLET | Freq: Two times a day (BID) | ORAL | Status: DC
  Administered 2012-08-31 – 2012-09-08 (×16): 6.25 mg via ORAL
  Filled 2012-08-31 (×19): qty 1

## 2012-08-31 NOTE — Progress Notes (Signed)
[facility] PROGRESS NOTE  [name]:[id number] DOB: 05-09-1946 DOA: 08/30/2012 PCP: [name], MD  Assessment/Plan:  Diastolic heart failure, acute on chronic exacerbation:  Dry weight is around 244 lbs and currently 278lbs.  Initial troponin elevation likely due to heart strain as was the case in 09/2011.   -  -1.3L yesterday, weight apparently stable -  ECHO report pending -  Continue diuresis, increased to 60 mg IV BID -  Frequent electrolytes -  Consider restarting beta blocker - defer to cardiology -  CXR demonstrates persistent fluid overload  Hypokalemia: -  Potassium chloride PO now and start BID while diuresing  CAD s/p 4V CABG 2001, neg myoview 2010.  Mild elevation in initial troponin, now normalized.  Possible NSTEMI.   -  Continue asa, statin,  -  Appreciate cardiology recommendations  Atrial fibrillation, HR rising gradually this morning and currently around 100bpm on telemetry.  2 pauses of ~1.8sec each -  CHADS2 of 3, but not on warfarin due to alcohol use -  Continue ASA -  If rate increases, consider restarting metoprolol  AKI, baseline creatinine 0.9, initially 1.9 and trending down with diuresis.  Likely cardiorenal syndrome.   -  Renal US:  Bilateral renal cysts -  FEurea:  pending -  Increase diuresis to 40mg  IV BID -  Trend electrolytes  Transaminitis:  AST: ALT almost 2:1.  May be due to alcohol use yesterday or heart failure:  Improving with diuresis.   -  Trend and if not resolving with abstinence from alcohol and diuresis, consider viral hep panel  Alcohol withdrawal symptoms -  Continue CIWA and ativan PRN, received 1 dose for CIWA of 8 yesterday  Anemia, normocytic, chronic, but hemoglobin slightly lower than baseline - patient had recent fall with palpable hematoma on left flank with extensive bruising.   -  Trend hgb -  Tx for hemoglobin <7 or sooner if sx  Hypothyroidism:   -  TSH:  10.1 -  Continue synthroid  at current dose - question compliance with medications  Pain, primarily along left flank hematoma -  Percocet as needed -  Morphine for breakthrough pain  Multiple abrasions with small amount of erythema -  Monitor for cellulitis but hold ABx at this time.   -  TED hose  DIET:  Healthy heart ACCESS:  PIV IVF:  None PROPH:  heparin   Code Status: Full code Family Communication: spoke with patient Disposition Plan: pending diuresis, kidney and liver function improving.  PT consult pending.     Consultants:  Cardiology  Procedures:  None  Antibiotics:  None  HPI/Subjective:  States he slept well last night.  Denies pain, difficulty breathing, nausea, vomiting, diarrhea, constipation.      Objective: Filed Vitals:   08/30/12 1400 08/30/12 1403 08/30/12 2115 08/31/12 0532  BP: 126/70 126/70 139/69 130/69  Pulse: 94 94 107 103  Temp:  97.7 F (36.5 C)  97.4 F (36.3 C)  TempSrc:  Oral Oral Oral  Resp:  20 22 20   Height:      Weight:    126.2 kg (278 lb 3.5 oz)  SpO2:  96% 94% 90%    Intake/Output Summary (Last 24 hours) at 08/31/12 0759 Last data filed at 08/31/12 0643  Gross per 24 hour  Intake    720 ml  Output   2025 ml  Net  -1305 ml   Filed Weights   08/30/12 0308 08/31/12 0532  Weight: 126.1 kg (278  lb) 126.2 kg (278 lb 3.5 oz)    Exam:   General:  CM, no acute distress  HEENT:  MMM  Neck:  JVP to preauricular area  Cardiovascular:  Irregularly irregular, HR around 90s.  No m/r/g  Respiratory: Rales bilateral bases to mid back without rhonchi or wheeze  Abdomen: NABS, soft, nondistended, nontender  MSK:  1+ pitting edema bilateral lower extremities  SKin:  10x15cm hematoma left flank with extensive bruising.  Multiple cuts and abrasions on lower extremities with less surrounding erythema and with evidence fo venous stasis, but no overt cellulitis.    Data Reviewed: Basic Metabolic Panel:  Lab 08/31/12 1610 08/30/12 0430  NA 142  142  K 3.2* 4.1  CL 98 99  CO2 33* 33*  GLUCOSE 113* 66*  BUN 72* 76*  CREATININE 1.37* 1.93*  CALCIUM 9.3 9.2  MG -- --  PHOS -- --   Liver Function Tests:  Lab 08/31/12 0515 08/30/12 0430  AST 128* 191*  ALT 96* 112*  ALKPHOS 87 99  BILITOT 0.8 0.7  PROT 6.7 7.1  ALBUMIN 2.9* 3.1*   No results found for this basename: LIPASE:5,AMYLASE:5 in the last 168 hours  Lab 08/30/12 0430  AMMONIA 27   CBC:  Lab 08/31/12 0515 08/30/12 0430  WBC 6.9 8.1  NEUTROABS -- 5.3  HGB 8.7* 9.0*  HCT 30.8* 31.5*  MCV 87.7 89.2  PLT 177 207   Cardiac Enzymes:  Lab 08/30/12 1804 08/30/12 1212 08/30/12 0416  CKTOTAL -- -- --  CKMB -- -- --  CKMBINDEX -- -- --  TROPONINI <0.30 <0.30 <0.30   BNP (last 3 results)  Basename 08/30/12 0416 10/13/11 0552 10/07/11 0407  PROBNP 18070.0* 5170.0* 14242.0*   CBG:  Lab 08/31/12 0625 08/30/12 2122 08/30/12 1557 08/30/12 1205  GLUCAP 119* 105* 104* 78    No results found for this or any previous visit (from the past 240 hour(s)).   Studies: US Renal  08/30/2012  *RADIOLOGY REPORT*  Clinical Data:  Acute renal insufficiency.  Diabetes and hypertension.  RENAL/URINARY TRACT ULTRASOUND COMPLETE  Comparison:  07/27/2012  Findings:  Right Kidney:  Measures 13.6 cm.  Normal in size and parenchymal echogenicity.  No evidence of mass or hydronephrosis. Cyst within the upper pole of the right kidney measures 3.2 x 2.7 x 3.6 cm.  Left Kidney:  Measures 14.5 cm.  Normal in size and parenchymal echogenicity.  No evidence of mass or hydronephrosis. There is a 2.6 x 2.0 x 2.2 cm cyst in the upper pole. A 3.3 x 2.75-0.4 cm cyst is noted in the inferior pole.  Bladder:  Not visualize secondary to postvoid state.  IMPRESSION:  1.  No obstructive uropathy. 2.  Bilateral renal cysts.   Original Report Authenticated By: Signa Kell, M.D.     Scheduled Meds:    . aspirin  325 mg Oral Daily  . FLUoxetine  20 mg Oral Daily  . folic acid  1 mg Oral Daily  .  furosemide  40 mg Intravenous BID  . gabapentin  300 mg Oral Daily  . heparin subcutaneous  5,000 Units Subcutaneous Q8H  . insulin aspart  0-9 Units Subcutaneous TID WC  . lactulose  20 g Oral TID  . levothyroxine  150 mcg Oral QAC breakfast  . multivitamin with minerals  1 tablet Oral Daily  . potassium chloride  60 mEq Oral Once  . simvastatin  40 mg Oral QHS  . sodium chloride  3 mL Intravenous Q12H  .  thiamine  100 mg Oral Daily  . [DISCONTINUED] furosemide  40 mg Intravenous Daily  . [DISCONTINUED] gabapentin  300 mg Oral Daily  . [DISCONTINUED] thiamine  100 mg Intravenous Daily   Continuous Infusions:   Principal Problem:  *Acute on chronic diastolic CHF (congestive heart failure) Active Problems:  DM  HYPERLIPIDEMIA  HYPERTENSION  CHRONIC OBSTRUCTIVE PULMONARY DISEASE  CAD  ETOH abuse  Atrial fibrillation  Hematoma-on back, sacral area, right chest wall post fall  Acute renal failure  Weakness generalized  Hypothyroidism  Acute respiratory failure with hypoxia  Elevated troponin  Elevated LFTs    Time spent: 30    Janellie Tennison, [facility]  [facility] Pager [phone]. If 8PM-8AM, please contact night-coverage at [email or web address], password [facility] 08/31/2012, 7:59 AM  LOS: 1 day

## 2012-08-31 NOTE — Progress Notes (Signed)
SUBJECTIVE: Breathing better. No chest pain. Only c/o back pain from hematoma after fall.   BP 130/69  Pulse 103  Temp 97.4 F (36.3 C) (Oral)  Resp 20  Ht 5\' 9"  (1.753 m)  Wt 278 lb 3.5 oz (126.2 kg)  BMI 41.09 kg/m2  SpO2 90%  Intake/Output Summary (Last 24 hours) at 08/31/12 1325 Last data filed at 08/31/12 1231  Gross per 24 hour  Intake    600 ml  Output   2025 ml  Net  -1425 ml    PHYSICAL EXAM General: Well developed, well nourished, in no acute distress. Alert and oriented x 3.  Psych:  Good affect, responds appropriately Neck: + JVD. No masses noted.  Lungs: Rhonci bilaterally Heart: Irregular, no loud murmrus Abdomen: Bowel sounds are present. Soft, non-tender.  Extremities: 2+ bilateral lower extremity edema.   LABS: Basic Metabolic Panel:  Basename 08/31/12 0515 08/30/12 0430  NA 142 142  K 3.2* 4.1  CL 98 99  CO2 33* 33*  GLUCOSE 113* 66*  BUN 72* 76*  CREATININE 1.37* 1.93*  CALCIUM 9.3 9.2  MG -- --  PHOS -- --   CBC:  Basename 08/31/12 0515 08/30/12 0430  WBC 6.9 8.1  NEUTROABS -- 5.3  HGB 8.7* 9.0*  HCT 30.8* 31.5*  MCV 87.7 89.2  PLT 177 207   Cardiac Enzymes:  Basename 08/30/12 1804 08/30/12 1212 08/30/12 0416  CKTOTAL -- -- --  CKMB -- -- --  CKMBINDEX -- -- --  TROPONINI <0.30 <0.30 <0.30   Current Meds:    . aspirin  325 mg Oral Daily  . FLUoxetine  20 mg Oral Daily  . folic acid  1 mg Oral Daily  . furosemide  40 mg Intravenous BID  . gabapentin  300 mg Oral Daily  . heparin subcutaneous  5,000 Units Subcutaneous Q8H  . insulin aspart  0-9 Units Subcutaneous TID WC  . lactulose  20 g Oral TID  . levothyroxine  150 mcg Oral QAC breakfast  . multivitamin with minerals  1 tablet Oral Daily  . potassium chloride  20 mEq Oral BID  . [COMPLETED] potassium chloride  60 mEq Oral Once  . simvastatin  40 mg Oral QHS  . sodium chloride  3 mL Intravenous Q12H  . thiamine  100 mg Oral Daily  . [DISCONTINUED] thiamine  100  mg Intravenous Daily   Echo 08/30/12:  Left ventricle: The cavity size was normal. Wall thickness was increased in a pattern of mild LVH. Systolic function was normal. The estimated ejection fraction was in the range of 55% to 60%. - Mitral valve: Calcified annulus. - Left atrium: The atrium was moderately dilated. - Right ventricle: The cavity size was moderately dilated. - Right atrium: The atrium was moderately dilated. - Tricuspid valve: Moderate regurgitation. - Pulmonary arteries: Systolic pressure was severely increased. Impressions:  - Subcostal views not obtained as patient became uncooperative.    ASSESSMENT AND PLAN:  1. Acute on chronic diastolic CHF: Agree with continued diuresis with IV Lasix. He also has severe Pulmonary HTN. He will need heart failure education. EtOH cessation is essential. Will ask CHF team to evaluate since he is high risk for readmission. He will likely need a right heart cath before discharge.    2. Persistent atrial fibrillation- Rate-controlled. He is not a coumadin candidate secondary to etoh abuse and non-compliance as well as frequent falls. This is well outlined in clinic notes. He may benefit from being in  sinus rhythm but his atrial fibrillation is long term and persistent.   3. Elevated troponin at outside hospital: No elevation of troponin in our hospital.    4. CAD- stable. No endorsement of chest pain. EKG does not indicate ischemia.  Continue ASA, statin. Restart beta blocker.  5. EtOH abuse- transaminitis noted on CMET on transfer- may be result of EtOH abuse and/or hepatic congestion from CHF. EtOH + on labwork at OSH. Cessation will be vital going forward. On CIWA protocol.   6. Acute renal insufficiency- Improving.  Will continue to monitor as he diureses further. Renal u/s noted bilateral renal cysts, no evidence of obstruction.   7. Normocytic anemia- likely d/t longstanding EtOH abuse.   8. Hypothyroidism- continue  Synthroid per primary team.   9. Hematoma- recently diagnosed secondary to falls. Continue to monitor H/H.   10. Altered mental status- per primary team.     [name]  11/21/20131:25 PM

## 2012-09-01 LAB — GLUCOSE, CAPILLARY
Glucose-Capillary: 151 mg/dL — ABNORMAL HIGH (ref 70–99)
Glucose-Capillary: 141 mg/dL — ABNORMAL HIGH (ref 70–99)

## 2012-09-01 LAB — COMPREHENSIVE METABOLIC PANEL WITH GFR
ALT: 88 U/L — ABNORMAL HIGH (ref 0–53)
AST: 97 U/L — ABNORMAL HIGH (ref 0–37)
Albumin: 2.8 g/dL — ABNORMAL LOW (ref 3.5–5.2)
Alkaline Phosphatase: 82 U/L (ref 39–117)
BUN: 70 mg/dL — ABNORMAL HIGH (ref 6–23)
CO2: 34 meq/L — ABNORMAL HIGH (ref 19–32)
Calcium: 9.2 mg/dL (ref 8.4–10.5)
Chloride: 96 meq/L (ref 96–112)
Creatinine, Ser: 1.31 mg/dL (ref 0.50–1.35)
GFR calc Af Amer: 63 mL/min — ABNORMAL LOW
GFR calc non Af Amer: 54 mL/min — ABNORMAL LOW
Glucose, Bld: 157 mg/dL — ABNORMAL HIGH (ref 70–99)
Potassium: 3.8 meq/L (ref 3.5–5.1)
Sodium: 139 meq/L (ref 135–145)
Total Bilirubin: 0.9 mg/dL (ref 0.3–1.2)
Total Protein: 6.4 g/dL (ref 6.0–8.3)

## 2012-09-01 LAB — URINALYSIS, ROUTINE W REFLEX MICROSCOPIC
Bilirubin Urine: NEGATIVE
Glucose, UA: NEGATIVE mg/dL
Hgb urine dipstick: NEGATIVE
Ketones, ur: NEGATIVE mg/dL
Leukocytes, UA: NEGATIVE
Protein, ur: NEGATIVE mg/dL
pH: 5.5 (ref 5.0–8.0)

## 2012-09-01 LAB — VITAMIN B12: Vitamin B-12: 606 pg/mL (ref 211–911)

## 2012-09-01 LAB — CBC
Hemoglobin: 8.5 g/dL — ABNORMAL LOW (ref 13.0–17.0)
MCH: 24.4 pg — ABNORMAL LOW (ref 26.0–34.0)
MCHC: 27.5 g/dL — ABNORMAL LOW (ref 30.0–36.0)
Platelets: 179 10*3/uL (ref 150–400)
RDW: 19.1 % — ABNORMAL HIGH (ref 11.5–15.5)

## 2012-09-01 MED ORDER — METOLAZONE 2.5 MG PO TABS
2.5000 mg | ORAL_TABLET | Freq: Every day | ORAL | Status: DC
  Administered 2012-09-01 – 2012-09-05 (×5): 2.5 mg via ORAL
  Filled 2012-09-01 (×6): qty 1

## 2012-09-01 MED ORDER — FUROSEMIDE 10 MG/ML IJ SOLN
120.0000 mg | Freq: Once | INTRAVENOUS | Status: AC
  Administered 2012-09-01: 120 mg via INTRAVENOUS
  Filled 2012-09-01 (×2): qty 12

## 2012-09-01 MED ORDER — LACTULOSE 10 GM/15ML PO SOLN
30.0000 g | Freq: Two times a day (BID) | ORAL | Status: DC
  Administered 2012-09-01 – 2012-09-02 (×3): 30 g via ORAL
  Filled 2012-09-01 (×4): qty 45

## 2012-09-01 MED ORDER — METOLAZONE 2.5 MG PO TABS
2.5000 mg | ORAL_TABLET | ORAL | Status: DC
  Filled 2012-09-01: qty 1

## 2012-09-01 MED ORDER — FUROSEMIDE 10 MG/ML IJ SOLN
120.0000 mg | Freq: Two times a day (BID) | INTRAVENOUS | Status: DC
  Administered 2012-09-01 – 2012-09-03 (×4): 120 mg via INTRAVENOUS
  Filled 2012-09-01 (×6): qty 12

## 2012-09-01 MED ORDER — METOLAZONE 2.5 MG PO TABS
2.5000 mg | ORAL_TABLET | ORAL | Status: AC
  Administered 2012-09-01: 2.5 mg via ORAL
  Filled 2012-09-01: qty 1

## 2012-09-01 NOTE — Progress Notes (Signed)
Rehab Admissions Coordinator Note:  Patient was screened by Brock Ra for appropriateness for an Inpatient Acute Rehab Consult.  At this time, we are recommending Inpatient Rehab consult.  Brock Ra 09/01/2012, 2:43 PM  I can be reached at [phone].

## 2012-09-01 NOTE — Evaluation (Signed)
[name],PT Acute Rehabilitation [phone] (pager)  

## 2012-09-01 NOTE — Care Management Note (Addendum)
    Page 1 of 1   09/01/2012     2:49:38 PM   CARE MANAGEMENT NOTE 09/01/2012  Patient:  TAIMUR, FIER   Account Number:  [id number]  Date Initiated:  09/01/2012  Documentation initiated by:  Junius Creamer  Subjective/Objective Assessment:   adm w chf     Action/Plan:   lives w fam, pcp dr [name]   Anticipated DC Date:     Anticipated DC Plan:    In-house referral  Clinical Social Worker      DC Planning Services  CM consult      Choice offered to / List presented to:             Status of service:   Medicare Important Message given?   (If response is "NO", the following Medicare IM given date fields will be blank) Date Medicare IM given:   Date Additional Medicare IM given:    Discharge Disposition:    Per UR Regulation:  Reviewed for med. necessity/level of care/duration of stay  If discussed at Long Length of Stay Meetings, dates discussed:    Comments:  11/22 10:16a debbie Jasn Xia rn,bsn [phone] for phy ther eval. may benefit from hhc for rn and poss phy ther. will cont to follow.phy ther rec snf vs cir dep on home support. sw ref made.

## 2012-09-01 NOTE — Progress Notes (Signed)
Subjective:  [name] is a 66yo male with PMX s/f CAD (s/p CABG x 4 in 2001, normal Myoview 2010), chronic diastolic CHF, persistent a-fib, DM, HTN, HL, carotid artery disease, COPD, hypothyroidism, EtOH abuse, chronic venous insufficiency and morbid obesity.  He is not anticoagulants due to ETOH abuse and fall risk.   Echo 09/2011: LVEF 45-50%, mod biatral enlargement, moderate RV dilatation, moderate TR, PASP 66 mmHg.  ECHO 08/2012 EF 55-60% moderate bilateral RA/LA enlargement  Admitted 08/30/12 for acute on chronic diastolic CHF. He has been diuresed with IV lasix 40 mg bid.   Admit weight 278 but trending up to 282 pounds. After reviewing office note weights from the last 6 months his weight was 240-250 pounds.   Sleepy but does deny dyspnea.       Intake/Output Summary (Last 24 hours) at 09/01/12 1309 Last data filed at 09/01/12 0730  Gross per 24 hour  Intake    600 ml  Output    625 ml  Net    -25 ml    Current meds:    . aspirin  325 mg Oral Daily  . carvedilol  6.25 mg Oral BID WC  . FLUoxetine  20 mg Oral Daily  . folic acid  1 mg Oral Daily  . furosemide  120 mg Intravenous Once  . gabapentin  300 mg Oral Daily  . heparin subcutaneous  5,000 Units Subcutaneous Q8H  . insulin aspart  0-9 Units Subcutaneous TID WC  . lactulose  30 g Oral BID  . levothyroxine  150 mcg Oral QAC breakfast  . multivitamin with minerals  1 tablet Oral Daily  . potassium chloride  20 mEq Oral BID  . simvastatin  40 mg Oral QHS  . sodium chloride  3 mL Intravenous Q12H  . thiamine  100 mg Oral Daily  . [DISCONTINUED] furosemide  40 mg Intravenous BID  . [DISCONTINUED] lactulose  20 g Oral TID   Infusions:     Objective:  Blood pressure 102/66, pulse 79, temperature 97.5 F (36.4 C), temperature source Oral, resp. rate 20, height 5\' 9"  (1.753 m), weight 128.3 kg (282 lb 13.6 oz), SpO2 92.00%. Weight change: 2.1 kg (4 lb 10.1 oz)  Physical Exam: General:  Sleepy Chronically  ill. No resp difficulty HEENT: normal Neck: supple. JVP difficult to assess due to body habitus appears up past jaw . Carotids 2+ bilat; no bruits. No lymphadenopathy or thryomegaly appreciated. Cor: PMI nondisplaced. Regular rate & rhythm. No rubs, gallops or murmurs. Lungs: decreased in the bases Abdomen: obese soft, nontender, nondistended. No hepatosplenomegaly. No bruits or masses. Good bowel sounds. Extremities: no cyanosis, clubbing, rash, R and LLE woody 2-3+ edema Neuro: alert & orientedx3, cranial nerves grossly intact. moves all 4 extremities w/o difficulty. Affect pleasant   Basic Metabolic Panel:  Lab 09/01/12 1610 08/31/12 0515 08/30/12 0430  NA 139 142 142  K 3.8 3.2* --  CL 96 98 99  CO2 34* 33* 33*  GLUCOSE 157* 113* 66*  BUN 70* 72* 76*  CREATININE 1.31 1.37* 1.93*  CALCIUM 9.2 9.3 9.2  MG -- -- --  PHOS -- -- --   Liver Function Tests:  Lab 09/01/12 0515 08/31/12 0515 08/30/12 0430  AST 97* 128* 191*  ALT 88* 96* 112*  ALKPHOS 82 87 99  BILITOT 0.9 0.8 0.7  PROT 6.4 6.7 7.1  ALBUMIN 2.8* 2.9* 3.1*   No results found for this basename: LIPASE:5,AMYLASE:5 in the last 168 hours  Lab  08/30/12 0430  AMMONIA 27   CBC:  Lab 09/01/12 0515 08/31/12 0515 08/30/12 0430  WBC 6.0 6.9 8.1  NEUTROABS -- -- 5.3  HGB 8.5* 8.7* 9.0*  HCT 30.9* 30.8* 31.5*  MCV 88.8 87.7 89.2  PLT 179 177 207   Cardiac Enzymes:  Lab 08/30/12 1804 08/30/12 1212 08/30/12 0416  CKTOTAL -- -- --  CKMB -- -- --  CKMBINDEX -- -- --  TROPONINI <0.30 <0.30 <0.30   BNP: No components found with this basename: POCBNP:5 CBG:  Lab 09/01/12 1143 09/01/12 0618 08/31/12 2057 08/31/12 1620 08/31/12 1126  GLUCAP 165* 151* 184* 138* 139*   Microbiology: Lab Results  Component Value Date   CULT NO GROWTH 10/05/2011   CULT NO GROWTH 5 DAYS 10/05/2011   CULT NO GROWTH 5 DAYS 10/05/2011   CULT NO GROWTH 5 DAYS 06/25/2010   CULT NO GROWTH 5 DAYS 06/25/2010   No results found for this  basename: CULT:2,SDES:2 in the last 168 hours  Imaging: Dg Chest 2 View  08/31/2012  *RADIOLOGY REPORT*  Clinical Data: Shortness of breath, recent fall with bruise over left posterior chest  CHEST - 2 VIEW  Comparison: Portable chest x-ray of 08/29/2012  Findings: There is cardiomegaly present with probable moderate pulmonary vascular congestion noted.  No effusion is seen.  Median sternotomy sutures are noted.  IMPRESSION: Cardiomegaly and probable moderate pulmonary vascular congestion.   Original Report Authenticated By: Dwyane Dee, M.D.      ASSESSMENT:  1. A/C diastolic heart failure EF 55-60% RV moderately dilated/HK 2. Right heart failure 3. A Fib-  4. Hypothyroidism 5. Obesity 6. CAD 7. Hematoma 7. Acute renal failure (baseline creatinine 0.8-1.1)  PLAN/DISCUSSION:  He is markedly volume overloaded with R >> L HF. Suspect he has 30 pounds of fluid on board. Will increase lasix and add metolazone. If not responding can place PICC and monitor CVPs and co-ox   LOS: 2 days    Arvilla Meres, MD 09/01/2012, 1:09 PM

## 2012-09-01 NOTE — Evaluation (Signed)
Physical Therapy Evaluation Patient Details Name: [name] MRN: [id number] DOB: 07/26/1946 Today's Date: 09/01/2012 Time: 4696-2952 PT Time Calculation (min): 30 min  PT Assessment / Plan / Recommendation Clinical Impression  Pt s/p acute exacerbation of CHF with recent fall.  Presents today with LE weakness, decreased mobility, easily fatigues, and confused throughout session.  Pt unable to give clear report of PLOF.  Pt will benefit from skilled physical therapy.  Discussed SNF with pt, who agrees this is a good option; reporting he has been to a SNF for prior incident and wouldn't mind going back.      PT Assessment  Patient needs continued PT services    Follow Up Recommendations  SNF;Supervision/Assistance - 24 hour (?CIR if 24hr care is available)    Does the patient have the potential to tolerate intense rehabilitation      Barriers to Discharge Decreased caregiver support ((unless we can clarify with pt's daughter that she can help))      Equipment Recommendations  None recommended by PT;Other (comment) (Need to confirm with daughter if pt needs additional equip)       Frequency Min 3X/week    Precautions / Restrictions Precautions Precautions: Fall Comments: Pt reports falling several times in past year and remains with large bruise and 'knot' on low back and left flank Restrictions Weight Bearing Restrictions: No   Pertinent Vitals/Pain O2 sats: pt was on RA at start of PT session = 85%; returned pt to 3L O2 via [name], sats returned to 93% and remained in 90s for entire session. HR stable.  Denies pain.      Mobility  Bed Mobility Bed Mobility: Supine to Sit;Sitting - Scoot to Edge of Bed Supine to Sit: 3: Mod assist;With rails Sitting - Scoot to Edge of Bed: 3: Mod assist;With rail Details for Bed Mobility Assistance: maximal effort for pt, increased wob, slow movement Transfers Transfers: Sit to Stand;Stand to Sit Sit to Stand: 1: +2 Total assist;With  upper extremity assist;From bed Sit to Stand: Patient Percentage: 50% Stand to Sit: 1: +1 Total assist;With upper extremity assist;To chair/3-in-1 (pt = 70%) Details for Transfer Assistance: pt req cues for hand placement, unable to control sit to chair Ambulation/Gait Ambulation/Gait Assistance: 2: Max assist Ambulation Distance (Feet): 10 Feet Assistive device: Rolling walker Ambulation/Gait Assistance Details: slow, difficulty picking feet up, pt states his Left leg is weaker than right and feels as though it may give out at any point.  VC for safe use of DME.  Gait Pattern: Step-to pattern;Decreased stride length;Decreased hip/knee flexion - right;Decreased hip/knee flexion - left;Decreased dorsiflexion - right;Decreased dorsiflexion - left;Shuffle;Trunk flexed Gait velocity: decr Stairs: No Wheelchair Mobility Wheelchair Mobility: No           PT Diagnosis: Difficulty walking;Generalized weakness  PT Problem List: Decreased strength;Decreased range of motion;Decreased activity tolerance;Decreased balance;Decreased mobility;Decreased coordination;Decreased cognition;Decreased knowledge of use of DME;Decreased safety awareness;Obesity;Pain;Decreased skin integrity;Impaired sensation;Decreased knowledge of precautions PT Treatment Interventions: DME instruction;Gait training;Functional mobility training;Therapeutic activities;Therapeutic exercise;Balance training;Patient/family education   PT Goals Acute Rehab PT Goals PT Goal Formulation: With patient Time For Goal Achievement: 09/15/12 Potential to Achieve Goals: Good Pt will go Supine/Side to Sit: with min assist PT Goal: Supine/Side to Sit - Progress: Goal set today Pt will go Sit to Stand: with min assist PT Goal: Sit to Stand - Progress: Goal set today Pt will go Stand to Sit: with min assist PT Goal: Stand to Sit - Progress: Goal set today Pt will Transfer  Bed to Chair/Chair to Bed: with mod assist PT Transfer Goal: Bed to  Chair/Chair to Bed - Progress: Goal set today Pt will Ambulate: 51 - 150 feet;with mod assist;with least restrictive assistive device PT Goal: Ambulate - Progress: Goal set today  Visit Information  Last PT Received On: 09/01/12 Assistance Needed: +2    Subjective Data  Subjective: Pt states he is tired Patient Stated Goal: to go home   Prior Functioning  Home Living Lives With: Family;Daughter Available Help at Discharge: Family;Available PRN/intermittently (pt thinks he will have help at home but not certain) Type of Home: House Home Access: Stairs to enter [employer] of Steps: 3 Entrance Stairs-Rails: Can reach both Home Layout: One level Bathroom Shower/Tub: Other (comment) (not established due to lack of pt attention) Home Adaptive Equipment: Walker - rolling;Straight cane Prior Function Level of Independence: Needs assistance Able to Take Stairs?: No Communication Communication:  (Difficult due to lethargy) Dominant Hand: Right    Cognition  Overall Cognitive Status: Impaired Area of Impairment: Attention;Memory;Following commands;Safety/judgement;Awareness of deficits Arousal/Alertness: Lethargic Orientation Level: Disoriented to;Time;Situation Behavior During Session: Lethargic Current Attention Level: Focused Memory:  (Decr recall of history and PLOF) Following Commands: Follows one step commands inconsistently Safety/Judgement: Decreased awareness of safety precautions;Decreased awareness of need for assistance;Decreased safety judgement for tasks assessed Cognition - Other Comments: Confused throughout session; difficulty telling time on face clock, difficulty remember date, inconsistant report of PLOF/history    Extremity/Trunk Assessment Right Upper Extremity Assessment RUE ROM/Strength/Tone: WFL for tasks assessed RUE Sensation: WFL - Light Touch Left Upper Extremity Assessment LUE ROM/Strength/Tone: WFL for tasks assessed LUE Sensation: WFL -  Light Touch Right Lower Extremity Assessment RLE ROM/Strength/Tone: Deficits RLE ROM/Strength/Tone Deficits: Grossly 2+/5  RLE Sensation: Deficits RLE Sensation Deficits: decr sensation to light touch below knee bilat. able to detect sustained pressure LL, some sensory in foot. Left Lower Extremity Assessment LLE ROM/Strength/Tone: Deficits LLE ROM/Strength/Tone Deficits: grossly 2/5 LLE Sensation: Deficits LLE Sensation Deficits: decr sensation to light touch below knee, deep pressure sensory intact in LL, NO Sensation in L FOOT Trunk Assessment Trunk Assessment: Kyphotic  Instructed pt to do ankle pumps while sitting in chair.    Balance Balance Balance Assessed: No  End of Session PT - End of Session Equipment Utilized During Treatment: Gait belt;Oxygen Activity Tolerance: Patient limited by fatigue;Patient limited by pain Patient left: in chair;with call bell/phone within reach Nurse Communication: Mobility status       Sharion Balloon 09/01/2012, 12:39 PM  Sharion Balloon, SPT Acute Rehab Services [phone]

## 2012-09-01 NOTE — Progress Notes (Signed)
[facility] PROGRESS NOTE  [name]:[id number] DOB: 1945-10-28 DOA: 08/30/2012 PCP: [name], MD  Assessment/Plan:  Diastolic heart failure, acute on chronic exacerbation, also severe pulmonary hypertension:  Dry weight is around 244 lbs and currently 278lbs.  Initial troponin elevation likely due to heart strain as was the case in 09/2011.  ECHO 11/20 demonstrated normal systolic function and EF of 55-60% with moderately dilated left atrium.  The PA pressure was severely elevated with moderate TR and moderately dilated RA.   -  - yesterday, weight increased by 2kg today -  Lasix gtt started by cardiology this morning. -  Frequent electrolytes -  Consider restarting beta blocker - defer to cardiology -  Per cards, will likely need right heart cath prior to discharge -  Appreciate cardiology recommendations  CAD s/p 4V CABG 2001, neg myoview 2010.  Mild elevation in initial troponin, now normalized - likely due to acute on chronic CHF.   -  Continue asa, statin.  BB currently held.    Atrial fibrillation, rate controlled -  CHADS2 of 3, but not on warfarin due to alcohol use -  Continue ASA -  If rate increases, consider restarting metoprolol  AKI, baseline creatinine 0.9, initially 1.9 and trending down with diuresis.  Likely cardiorenal syndrome.   -  Renal US:  Bilateral renal cysts -  FEurea:  20% consistent with prerenal versus cardiorenal syndrome but improving with diuresis so likely the latter.    Hypokalemia:  resolving -  Potassium chloride PO BID with additional supplementation prn while diuresing  Transaminitis:  AST: ALT almost 2:1.  May be due to alcohol use yesterday or heart failure:  Improving with diuresis.   -  Trend and if not resolving with abstinence from alcohol and diuresis, consider viral hep panel  Alcohol withdrawal symptoms, scores 2-7 -  Continue CIWA and ativan PRN  AMS:  TSH was mildly elevated, but question patient  compliance and continued previous dose of synthroid.  Patient has asterixis on exam despite normal ammonia level.   -  Urinalysis -  Vitamin B12 -  Vitamin B1 -  RPR -  Continue lactulose to titrate to 2-3 BMs per day   DM:  fingersticks 130-184, near goal -  Continue SSI  Anemia, normocytic, chronic, but hemoglobin slightly lower than baseline - patient had recent fall with palpable hematoma on left flank with extensive bruising.  Likely due to chronic alcohol use -  Trend hgb -  Tx for hemoglobin <7 or sooner if sx  Hypothyroidism:   -  TSH:  10.1 -  Continue synthroid at current dose - question compliance with medications.  Will need outpatient repeat  Pain, primarily along left flank hematoma -  Percocet as needed -  DC Morphine to minimize narcotics  Constipation:  -  Lactulose  Multiple abrasions with small amount of erythema, improving.   -  TED hose  DIET:  Healthy heart ACCESS:  PIV IVF:  None PROPH:  heparin   Code Status: Full code Family Communication: spoke with patient Disposition Plan: pending diuresis, kidney and liver function improving.  PT consult pending.     Consultants:  Cardiology  Procedures:  None  Antibiotics:  None  HPI/Subjective:  Denies pain, difficulty breathing, nausea, vomiting, diarrhea.  Feels constipated.    States his legs are numb, but feel better  Objective: Filed Vitals:   08/31/12 1934 08/31/12 2056 09/01/12 0130 09/01/12 0614  BP:  128/82  102/66  Pulse:  120 114 80 79  Temp:  98.2 F (36.8 C)  97.5 F (36.4 C)  TempSrc:  Oral  Oral  Resp:  20  20  Height:      Weight:    128.3 kg (282 lb 13.6 oz)  SpO2:  98%  100%    Intake/Output Summary (Last 24 hours) at 09/01/12 0807 Last data filed at 09/01/12 0600  Gross per 24 hour  Intake    480 ml  Output   1050 ml  Net   -570 ml   Filed Weights   08/30/12 0308 08/31/12 0532 09/01/12 0614  Weight: 126.1 kg (278 lb) 126.2 kg (278 lb 3.5 oz) 128.3 kg (282  lb 13.6 oz)    Exam:   General:  CM, no acute distress  HEENT:  MMM  Neck:  JVP to preauricular area  Cardiovascular:  Irregularly irregular, HR around 90s.  No m/r/g  Respiratory: Rales bilateral bases to mid back without rhonchi or wheeze  Abdomen: NABS, soft, nondistended, nontender  MSK:  1+ pitting edema bilateral lower extremities  SKin:  no overt lower extremity cellulitis.    Psych:  Patient mildly sleepy, but A&Ox3.  States he is in the hospital because he fell.    Data Reviewed: Basic Metabolic Panel:  Lab 09/01/12 1610 08/31/12 0515 08/30/12 0430  NA 139 142 142  K 3.8 3.2* 4.1  CL 96 98 99  CO2 34* 33* 33*  GLUCOSE 157* 113* 66*  BUN 70* 72* 76*  CREATININE 1.31 1.37* 1.93*  CALCIUM 9.2 9.3 9.2  MG -- -- --  PHOS -- -- --   Liver Function Tests:  Lab 09/01/12 0515 08/31/12 0515 08/30/12 0430  AST 97* 128* 191*  ALT 88* 96* 112*  ALKPHOS 82 87 99  BILITOT 0.9 0.8 0.7  PROT 6.4 6.7 7.1  ALBUMIN 2.8* 2.9* 3.1*   No results found for this basename: LIPASE:5,AMYLASE:5 in the last 168 hours  Lab 08/30/12 0430  AMMONIA 27   CBC:  Lab 09/01/12 0515 08/31/12 0515 08/30/12 0430  WBC 6.0 6.9 8.1  NEUTROABS -- -- 5.3  HGB 8.5* 8.7* 9.0*  HCT 30.9* 30.8* 31.5*  MCV 88.8 87.7 89.2  PLT 179 177 207   Cardiac Enzymes:  Lab 08/30/12 1804 08/30/12 1212 08/30/12 0416  CKTOTAL -- -- --  CKMB -- -- --  CKMBINDEX -- -- --  TROPONINI <0.30 <0.30 <0.30   BNP (last 3 results)  Basename 08/30/12 0416 10/13/11 0552 10/07/11 0407  PROBNP 18070.0* 5170.0* 14242.0*   CBG:  Lab 09/01/12 0618 08/31/12 2057 08/31/12 1620 08/31/12 1126 08/31/12 0625  GLUCAP 151* 184* 138* 139* 119*    No results found for this or any previous visit (from the past 240 hour(s)).   Studies: Dg Chest 2 View  08/31/2012  *RADIOLOGY REPORT*  Clinical Data: Shortness of breath, recent fall with bruise over left posterior chest  CHEST - 2 VIEW  Comparison: Portable chest  x-ray of 08/29/2012  Findings: There is cardiomegaly present with probable moderate pulmonary vascular congestion noted.  No effusion is seen.  Median sternotomy sutures are noted.  IMPRESSION: Cardiomegaly and probable moderate pulmonary vascular congestion.   Original Report Authenticated By: Dwyane Dee, M.D.    US Renal  08/30/2012  *RADIOLOGY REPORT*  Clinical Data:  Acute renal insufficiency.  Diabetes and hypertension.  RENAL/URINARY TRACT ULTRASOUND COMPLETE  Comparison:  07/27/2012  Findings:  Right Kidney:  Measures 13.6 cm.  Normal in size and parenchymal echogenicity.  No evidence of mass or hydronephrosis. Cyst within the upper pole of the right kidney measures 3.2 x 2.7 x 3.6 cm.  Left Kidney:  Measures 14.5 cm.  Normal in size and parenchymal echogenicity.  No evidence of mass or hydronephrosis. There is a 2.6 x 2.0 x 2.2 cm cyst in the upper pole. A 3.3 x 2.75-0.4 cm cyst is noted in the inferior pole.  Bladder:  Not visualize secondary to postvoid state.  IMPRESSION:  1.  No obstructive uropathy. 2.  Bilateral renal cysts.   Original Report Authenticated By: Signa Kell, M.D.     Scheduled Meds:    . aspirin  325 mg Oral Daily  . carvedilol  6.25 mg Oral BID WC  . FLUoxetine  20 mg Oral Daily  . folic acid  1 mg Oral Daily  . furosemide  120 mg Intravenous Once  . gabapentin  300 mg Oral Daily  . heparin subcutaneous  5,000 Units Subcutaneous Q8H  . insulin aspart  0-9 Units Subcutaneous TID WC  . levothyroxine  150 mcg Oral QAC breakfast  . multivitamin with minerals  1 tablet Oral Daily  . potassium chloride  20 mEq Oral BID  . [COMPLETED] potassium chloride  60 mEq Oral Once  . simvastatin  40 mg Oral QHS  . sodium chloride  3 mL Intravenous Q12H  . thiamine  100 mg Oral Daily  . [DISCONTINUED] furosemide  40 mg Intravenous BID  . [DISCONTINUED] lactulose  20 g Oral TID   Continuous Infusions:   Principal Problem:  *Acute on chronic diastolic CHF (congestive heart  failure) Active Problems:  DM  HYPERLIPIDEMIA  HYPERTENSION  CHRONIC OBSTRUCTIVE PULMONARY DISEASE  CAD  ETOH abuse  Atrial fibrillation  Hematoma-on back, sacral area, right chest wall post fall  Acute renal failure  Weakness generalized  Hypothyroidism  Acute respiratory failure with hypoxia  Elevated troponin  Elevated LFTs    Time spent: 30    Garo Heidelberg, [facility]  [facility] Pager [phone]. If 8PM-8AM, please contact night-coverage at [email or web address], password [facility] 09/01/2012, 8:07 AM  LOS: 2 days

## 2012-09-01 NOTE — Progress Notes (Signed)
Pt ambulated to door way and back (approximately 26 ft)  with walker and gait belt for minimal assistance/support.

## 2012-09-02 ENCOUNTER — Encounter (HOSPITAL_COMMUNITY): Payer: Self-pay | Admitting: Internal Medicine

## 2012-09-02 LAB — CBC
MCV: 91.3 fL (ref 78.0–100.0)
Platelets: 148 10*3/uL — ABNORMAL LOW (ref 150–400)
RBC: 3.45 MIL/uL — ABNORMAL LOW (ref 4.22–5.81)
RDW: 19.1 % — ABNORMAL HIGH (ref 11.5–15.5)
WBC: 5.2 10*3/uL (ref 4.0–10.5)

## 2012-09-02 LAB — COMPREHENSIVE METABOLIC PANEL
ALT: 76 U/L — ABNORMAL HIGH (ref 0–53)
AST: 68 U/L — ABNORMAL HIGH (ref 0–37)
Albumin: 2.8 g/dL — ABNORMAL LOW (ref 3.5–5.2)
Calcium: 9.2 mg/dL (ref 8.4–10.5)
Chloride: 97 mEq/L (ref 96–112)
Creatinine, Ser: 1.27 mg/dL (ref 0.50–1.35)
Sodium: 140 mEq/L (ref 135–145)
Total Bilirubin: 0.8 mg/dL (ref 0.3–1.2)

## 2012-09-02 LAB — GLUCOSE, CAPILLARY
Glucose-Capillary: 156 mg/dL — ABNORMAL HIGH (ref 70–99)
Glucose-Capillary: 166 mg/dL — ABNORMAL HIGH (ref 70–99)

## 2012-09-02 MED ORDER — POTASSIUM CHLORIDE CRYS ER 20 MEQ PO TBCR
20.0000 meq | EXTENDED_RELEASE_TABLET | Freq: Once | ORAL | Status: AC
  Administered 2012-09-02: 20 meq via ORAL

## 2012-09-02 MED ORDER — LACTULOSE 10 GM/15ML PO SOLN
30.0000 g | Freq: Three times a day (TID) | ORAL | Status: DC
  Administered 2012-09-03: 30 g via ORAL
  Filled 2012-09-02 (×6): qty 45

## 2012-09-02 MED ORDER — FLEET ENEMA 7-19 GM/118ML RE ENEM
1.0000 | ENEMA | Freq: Every day | RECTAL | Status: DC | PRN
  Filled 2012-09-02: qty 1

## 2012-09-02 MED ORDER — POTASSIUM CHLORIDE 20 MEQ PO PACK
20.0000 meq | PACK | Freq: Once | ORAL | Status: DC
  Filled 2012-09-02: qty 1

## 2012-09-02 MED ORDER — MAGNESIUM CITRATE PO SOLN: 1.0000 | Freq: Once | ORAL | Status: DC

## 2012-09-02 NOTE — Progress Notes (Signed)
Patient ID: [name], male   DOB: 1945-11-12, 66 y.o.   MRN: [id number]    Subjective:  [name] is a 66yo male with PMX s/f CAD (s/p CABG x 4 in 2001, normal Myoview 2010), chronic diastolic CHF, persistent a-fib, DM, HTN, HL, carotid artery disease, COPD, hypothyroidism, EtOH abuse, chronic venous insufficiency and morbid obesity.  He is not anticoagulants due to ETOH abuse and fall risk.   Echo 09/2011: LVEF 45-50%, mod biatral enlargement, moderate RV dilatation, moderate TR, PASP 66 mmHg.  ECHO 08/2012 EF 55-60% moderate bilateral RA/LA enlargement  Admitted 08/30/12 for acute on chronic diastolic CHF. He has been diuresed with IV lasix 40 mg bid.   Admit weight 278 but trending up to 282 pounds. After reviewing office note weights from the last 6 months his weight was 240-250 pounds.   Lasix was increased yesterday and metolazone was added.  He has had increased urine output and weight is down over 5 lbs.  Walked around room with assistance.      Intake/Output Summary (Last 24 hours) at 09/02/12 1243 Last data filed at 09/02/12 1147  Gross per 24 hour  Intake    484 ml  Output   2025 ml  Net  -1541 ml    Current meds:    . aspirin  325 mg Oral Daily  . carvedilol  6.25 mg Oral BID WC  . FLUoxetine  20 mg Oral Daily  . folic acid  1 mg Oral Daily  . [COMPLETED] furosemide  120 mg Intravenous Once  . furosemide  120 mg Intravenous BID  . gabapentin  300 mg Oral Daily  . heparin subcutaneous  5,000 Units Subcutaneous Q8H  . insulin aspart  0-9 Units Subcutaneous TID WC  . lactulose  30 g Oral BID  . levothyroxine  150 mcg Oral QAC breakfast  . [COMPLETED] metolazone  2.5 mg Oral NOW  . metolazone  2.5 mg Oral Daily  . multivitamin with minerals  1 tablet Oral Daily  . potassium chloride  20 mEq Oral BID  . simvastatin  40 mg Oral QHS  . sodium chloride  3 mL Intravenous Q12H  . thiamine  100 mg Oral Daily  . [DISCONTINUED] metolazone  2.5 mg Oral Q24H    Infusions:     Objective:  Blood pressure 131/68, pulse 63, temperature 97.2 F (36.2 C), temperature source Oral, resp. rate 16, height 5\' 9"  (1.753 m), weight 275 lb 9.2 oz (125 kg), SpO2 99.00%. Weight change: -7 lb 4.4 oz (-3.3 kg)  Physical Exam: General:  Sleepy Chronically ill. No resp difficulty HEENT: normal Neck: supple. JVP 14-16 cm. Carotids 2+ bilat; no bruits. No lymphadenopathy or thryomegaly appreciated. Cor: PMI nondisplaced. Regular rate & rhythm. No rubs, gallops. 1/6 SEM RUSB. Lungs: Crackles at bases bilaterally Abdomen: obese soft, nontender, nondistended. No hepatosplenomegaly. No bruits or masses. Good bowel sounds. Extremities: no cyanosis, clubbing, rash, R and LLE woody 2+ edema about 3/4 to knee.  Neuro: alert & orientedx3, cranial nerves grossly intact. moves all 4 extremities w/o difficulty. Affect pleasant   Basic Metabolic Panel:  Lab 09/02/12 4098 09/01/12 0515 08/31/12 0515 08/30/12 0430  NA 140 139 142 142  K 3.8 3.8 -- --  CL 97 96 98 99  CO2 35* 34* 33* 33*  GLUCOSE 153* 157* 113* 66*  BUN 67* 70* 72* 76*  CREATININE 1.27 1.31 1.37* 1.93*  CALCIUM 9.2 9.2 9.3 9.2  MG -- -- -- --  PHOS -- -- -- --  Liver Function Tests:  Lab 09/02/12 0658 09/01/12 0515 08/31/12 0515 08/30/12 0430  AST 68* 97* 128* 191*  ALT 76* 88* 96* 112*  ALKPHOS 79 82 87 99  BILITOT 0.8 0.9 0.8 0.7  PROT 6.7 6.4 6.7 7.1  ALBUMIN 2.8* 2.8* 2.9* 3.1*   No results found for this basename: LIPASE:5,AMYLASE:5 in the last 168 hours  Lab 08/30/12 0430  AMMONIA 27   CBC:  Lab 09/02/12 0658 09/01/12 0515 08/31/12 0515 08/30/12 0430  WBC 5.2 6.0 6.9 8.1  NEUTROABS -- -- -- 5.3  HGB 8.8* 8.5* 8.7* 9.0*  HCT 31.5* 30.9* 30.8* 31.5*  MCV 91.3 88.8 87.7 89.2  PLT 148* 179 177 207   Cardiac Enzymes:  Lab 08/30/12 1804 08/30/12 1212 08/30/12 0416  CKTOTAL -- -- --  CKMB -- -- --  CKMBINDEX -- -- --  TROPONINI <0.30 <0.30 <0.30   BNP: No components found  with this basename: POCBNP:5 CBG:  Lab 09/02/12 1125 09/02/12 0633 09/01/12 2230 09/01/12 1629 09/01/12 1143  GLUCAP 166* 156* 229* 141* 165*   Microbiology: Lab Results  Component Value Date   CULT NO GROWTH 10/05/2011   CULT NO GROWTH 5 DAYS 10/05/2011   CULT NO GROWTH 5 DAYS 10/05/2011   CULT NO GROWTH 5 DAYS 06/25/2010   CULT NO GROWTH 5 DAYS 06/25/2010   No results found for this basename: CULT:2,SDES:2 in the last 168 hours  Imaging: No results found.   ASSESSMENT:  1. A/C diastolic heart failure EF 55-60% RV moderately dilated/HK with RV-RA gradient 56 mmHg.  2. Right heart failure 3. A Fib 4. Hypothyroidism 5. Obesity 6. CAD 7. Hematoma 7. Acute renal failure (baseline creatinine 0.8-1.1): suspect cardiorenal  PLAN/DISCUSSION:  Still volume overloaded but diuresing better on current regimen and weight is down.  Continue current regimen.  Renal function stable to improved.  Possibly will see some improvement with diuresis as renal venous pressure is lowered and RV size is decreased.    LOS: 3 days    [name], MD 09/02/2012, 12:43 PM

## 2012-09-02 NOTE — Progress Notes (Signed)
[facility] PROGRESS NOTE  [name] ZOX:[id number] DOB: [birth year] DOA: 08/30/2012 PCP: [name], MD  Assessment/Plan:  Diastolic heart failure, acute on chronic exacerbation, also severe pulmonary hypertension:  Dry weight is around 244 lbs and currently 278lbs.  Initial troponin elevation likely due to heart strain as was the case in 09/2011.  ECHO 11/20 demonstrated normal systolic function and EF of 55-60% with moderately dilated left atrium.  The PA pressure was severely elevated with moderate TR and moderately dilated RA.   -  Lasix gtt and metolazone started 11/22  - Neg 1.6L yesterday, weight decreased by 3kg today -  Frequent electrolytes -  Per cards, will likely need right heart cath prior to discharge -  Appreciate cardiology recommendations  AMS:  TSH was mildly elevated, but question patient compliance and continued previous dose of synthroid.  Patient has asterixis on exam despite normal ammonia level.   -  Urinalysis neg -  Vitamin B12 606 -  Vitamin B1 pending -  RPR NR -  Continue lactulose to titrate to 2-3 BMs per day   CAD s/p 4V CABG 2001, neg myoview 2010.  Mild elevation in initial troponin, now normalized - likely due to acute on chronic CHF.   -  Continue asa, statin.  BB currently held.    Atrial fibrillation, rate controlled -  CHADS2 of 3, but not on warfarin due to alcohol use -  Continue ASA and Carvedilol  AKI, baseline creatinine 0.9, initially 1.9 and trending down with diuresis.  FEurea 20%.  Likely cardiorenal syndrome.    Hypokalemia:  Resolved. - cont. Potassium chloride PO BID with additional supplementation prn while diuresing  Transaminitis:  AST: ALT almost 2:1.  May be due to alcohol use yesterday or heart failure:  Improving with diuresis.  Will need repeat as outpatient.    Alcohol withdrawal symptoms, scores 1, 2 yesterday.  DC ativan  DM:  fingersticks 141-229, near goal -  Continue SSI  Anemia, normocytic,  chronic, but hemoglobin slightly lower than baseline - patient had recent fall with palpable hematoma on left flank with extensive bruising.  Likely due to chronic alcohol use.  Hgb stable around 8.8.    Thrombocytopenia, mild.  Likely due to acute illness.   -  Trend  Hypothyroidism:   -  TSH:  10.1 -  Continue synthroid at current dose - question compliance with medications.  Will need outpatient repeat  Pain, primarily along left flank hematoma -  Percocet as needed -  DC Morphine to minimize narcotics  Constipation:  -  Lactulose increase to TID -  fleets enemas as needed  Multiple abrasions with small amount of erythema, improving.  Likely PVD with poor distal pulses -  TED hose -  Consider ABIs as outpatient  DIET:  Healthy heart ACCESS:  PIV IVF:  None PROPH:  heparin   Code Status: Full code Family Communication: spoke with patient Disposition Plan: pending diuresis, kidney and liver function improving.  PT consult pending.     Consultants:  Cardiology  Procedures:  None  Antibiotics:  None  HPI/Subjective:  Denies chest pain, nausea, vomiting, diarrhea.  Feels constipated.  States his breathing feels better today.    Objective: Filed Vitals:   09/01/12 1436 09/01/12 1918 09/02/12 0554 09/02/12 1504  BP: 110/69 101/43 131/68 115/70  Pulse: 78 72 63 79  Temp: 97.4 F (36.3 C) 97.6 F (36.4 C) 97.2 F (36.2 C) 98 F (36.7 C)  TempSrc: Oral Oral Oral  Oral  Resp: 20 18 16 18   Height:      Weight:   125 kg (275 lb 9.2 oz)   SpO2: 93% 97% 99% 98%    Intake/Output Summary (Last 24 hours) at 09/02/12 1834 Last data filed at 09/02/12 1833  Gross per 24 hour  Intake    720 ml  Output   2351 ml  Net  -1631 ml   Filed Weights   08/31/12 0532 09/01/12 0614 09/02/12 0554  Weight: 126.2 kg (278 lb 3.5 oz) 128.3 kg (282 lb 13.6 oz) 125 kg (275 lb 9.2 oz)    Exam:   General:  CM, no acute distress  HEENT:  MMM  Neck:  JVP to preauricular  area  Cardiovascular:  Irregularly irregular, HR around 90s.  No m/r/g  Respiratory: Rales bilateral bases without rhonchi or wheeze, improved from yesterday  Abdomen: NABS, soft, nondistended, nontender  MSK:  1+ pitting edema bilateral lower extremities, 1+ DP, cool extremities  SKin:  no overt lower extremity cellulitis.    Psych:  Patient mildly sleepy, but A&Ox3.  States he is in the hospital because he fell.    Data Reviewed: Basic Metabolic Panel:  Lab 09/02/12 2952 09/01/12 0515 08/31/12 0515 08/30/12 0430  NA 140 139 142 142  K 3.8 3.8 3.2* 4.1  CL 97 96 98 99  CO2 35* 34* 33* 33*  GLUCOSE 153* 157* 113* 66*  BUN 67* 70* 72* 76*  CREATININE 1.27 1.31 1.37* 1.93*  CALCIUM 9.2 9.2 9.3 9.2  MG -- -- -- --  PHOS -- -- -- --   Liver Function Tests:  Lab 09/02/12 0658 09/01/12 0515 08/31/12 0515 08/30/12 0430  AST 68* 97* 128* 191*  ALT 76* 88* 96* 112*  ALKPHOS 79 82 87 99  BILITOT 0.8 0.9 0.8 0.7  PROT 6.7 6.4 6.7 7.1  ALBUMIN 2.8* 2.8* 2.9* 3.1*   No results found for this basename: LIPASE:5,AMYLASE:5 in the last 168 hours  Lab 08/30/12 0430  AMMONIA 27   CBC:  Lab 09/02/12 0658 09/01/12 0515 08/31/12 0515 08/30/12 0430  WBC 5.2 6.0 6.9 8.1  NEUTROABS -- -- -- 5.3  HGB 8.8* 8.5* 8.7* 9.0*  HCT 31.5* 30.9* 30.8* 31.5*  MCV 91.3 88.8 87.7 89.2  PLT 148* 179 177 207   Cardiac Enzymes:  Lab 08/30/12 1804 08/30/12 1212 08/30/12 0416  CKTOTAL -- -- --  CKMB -- -- --  CKMBINDEX -- -- --  TROPONINI <0.30 <0.30 <0.30   BNP (last 3 results)  Basename 08/30/12 0416 10/13/11 0552 10/07/11 0407  PROBNP 18070.0* 5170.0* 14242.0*   CBG:  Lab 09/02/12 1619 09/02/12 1125 09/02/12 0633 09/01/12 2230 09/01/12 1629  GLUCAP 184* 166* 156* 229* 141*    No results found for this or any previous visit (from the past 240 hour(s)).   Studies: No results found.  Scheduled Meds:    . aspirin  325 mg Oral Daily  . carvedilol  6.25 mg Oral BID WC  .  FLUoxetine  20 mg Oral Daily  . folic acid  1 mg Oral Daily  . furosemide  120 mg Intravenous BID  . gabapentin  300 mg Oral Daily  . heparin subcutaneous  5,000 Units Subcutaneous Q8H  . insulin aspart  0-9 Units Subcutaneous TID WC  . lactulose  30 g Oral BID  . levothyroxine  150 mcg Oral QAC breakfast  . metolazone  2.5 mg Oral Daily  . multivitamin with minerals  1 tablet Oral  Daily  . potassium chloride  20 mEq Oral BID  . [COMPLETED] potassium chloride  20 mEq Oral Once  . simvastatin  40 mg Oral QHS  . sodium chloride  3 mL Intravenous Q12H  . thiamine  100 mg Oral Daily  . [DISCONTINUED] potassium chloride  20 mEq Oral Once   Continuous Infusions:   Principal Problem:  *Acute on chronic diastolic CHF (congestive heart failure) Active Problems:  DM  HYPERLIPIDEMIA  HYPERTENSION  CHRONIC OBSTRUCTIVE PULMONARY DISEASE  CAD  ETOH abuse  Atrial fibrillation  Hematoma-on back, sacral area, right chest wall post fall  Acute renal failure  Weakness generalized  Hypothyroidism  Acute respiratory failure with hypoxia  Elevated troponin  Elevated LFTs    Time spent: 30    Zamariya Neal, [facility]  [facility] Pager [phone]. If 8PM-8AM, please contact night-coverage at [email or web address], password [facility] 09/02/2012, 6:34 PM  LOS: 3 days

## 2012-09-03 ENCOUNTER — Inpatient Hospital Stay (HOSPITAL_COMMUNITY): Payer: Medicare Other

## 2012-09-03 DIAGNOSIS — I5032 Chronic diastolic (congestive) heart failure: Secondary | ICD-10-CM

## 2012-09-03 LAB — BASIC METABOLIC PANEL
Chloride: 93 mEq/L — ABNORMAL LOW (ref 96–112)
GFR calc Af Amer: 69 mL/min — ABNORMAL LOW (ref >90)
GFR calc non Af Amer: 60 mL/min — ABNORMAL LOW (ref >90)
Potassium: 3.5 mEq/L (ref 3.5–5.1)
Sodium: 138 mEq/L (ref 135–145)

## 2012-09-03 LAB — URINALYSIS, ROUTINE W REFLEX MICROSCOPIC
Nitrite: NEGATIVE
Protein, ur: 100 mg/dL — AB
Specific Gravity, Urine: 1.009 (ref 1.005–1.030)
Urobilinogen, UA: 1 mg/dL (ref 0.0–1.0)

## 2012-09-03 LAB — CBC
Platelets: 120 10*3/uL — ABNORMAL LOW (ref 150–400)
RBC: 3.59 MIL/uL — ABNORMAL LOW (ref 4.22–5.81)
RDW: 19.1 % — ABNORMAL HIGH (ref 11.5–15.5)
WBC: 6.8 10*3/uL (ref 4.0–10.5)

## 2012-09-03 LAB — URINE MICROSCOPIC-ADD ON

## 2012-09-03 LAB — GLUCOSE, CAPILLARY
Glucose-Capillary: 144 mg/dL — ABNORMAL HIGH (ref 70–99)
Glucose-Capillary: 180 mg/dL — ABNORMAL HIGH (ref 70–99)

## 2012-09-03 MED ORDER — SODIUM CHLORIDE 0.9 % IJ SOLN
10.0000 mL | INTRAMUSCULAR | Status: DC | PRN
  Administered 2012-09-04: 20 mL
  Administered 2012-09-05: 10 mL

## 2012-09-03 MED ORDER — FUROSEMIDE 10 MG/ML IJ SOLN
15.0000 mg/h | INTRAVENOUS | Status: DC
  Administered 2012-09-03 – 2012-09-07 (×6): 15 mg/h via INTRAVENOUS
  Filled 2012-09-03 (×15): qty 25

## 2012-09-03 MED ORDER — SODIUM CHLORIDE 0.9 % IJ SOLN
10.0000 mL | Freq: Two times a day (BID) | INTRAMUSCULAR | Status: DC
  Administered 2012-09-03: 10 mL
  Administered 2012-09-05: 13 mL
  Administered 2012-09-06 – 2012-09-07 (×4): 10 mL

## 2012-09-03 MED ORDER — POTASSIUM CHLORIDE CRYS ER 20 MEQ PO TBCR
40.0000 meq | EXTENDED_RELEASE_TABLET | Freq: Once | ORAL | Status: AC
  Administered 2012-09-03: 40 meq via ORAL

## 2012-09-03 MED ORDER — HEPARIN (PORCINE) IN NACL 100-0.45 UNIT/ML-% IJ SOLN
1650.0000 [IU]/h | INTRAMUSCULAR | Status: DC
  Administered 2012-09-03 (×2): 1300 [IU]/h via INTRAVENOUS
  Filled 2012-09-03 (×6): qty 250

## 2012-09-03 MED ORDER — DEXTROSE 5 % IV SOLN
1.0000 g | INTRAVENOUS | Status: DC
  Administered 2012-09-03 – 2012-09-04 (×2): 1 g via INTRAVENOUS
  Filled 2012-09-03 (×4): qty 10

## 2012-09-03 MED ORDER — PHENAZOPYRIDINE HCL 200 MG PO TABS
200.0000 mg | ORAL_TABLET | Freq: Once | ORAL | Status: AC
  Administered 2012-09-03: 200 mg via ORAL
  Filled 2012-09-03: qty 1

## 2012-09-03 NOTE — Progress Notes (Signed)
ANTICOAGULATION CONSULT NOTE - Initial Consult  Pharmacy Consult for heparin Indication: preparation for UF  Allergies  Allergen Reactions  . Erythromycin Hives    Patient Measurements: Height: 5\' 9"  (175.3 cm) Weight: 278 lb (126.1 kg) (standing scale) IBW/kg (Calculated) : 70.7  Heparin Dosing Weight: 89 kg  Vital Signs: Temp: 97.3 F (36.3 C) (11/24 0521) Temp src: Oral (11/24 0521) BP: 133/75 mmHg (11/24 0521) Pulse Rate: 74  (11/24 0521)  Labs:  Basename 09/03/12 0605 09/02/12 0658 09/01/12 0515  HGB 9.2* 8.8* --  HCT 32.2* 31.5* 30.9*  PLT 120* 148* 179  APTT -- -- --  LABPROT -- -- --  INR -- -- --  HEPARINUNFRC -- -- --  CREATININE 1.21 1.27 1.31  CKTOTAL -- -- --  CKMB -- -- --  TROPONINI -- -- --    Estimated Creatinine Clearance: 76.8 ml/min (by C-G formula based on Cr of 1.21).   Medical History: Past Medical History  Diagnosis Date  . CHRONIC OBSTRUCTIVE PULMONARY DISEASE 06/20/2009  . OBSTRUCTIVE SLEEP APNEA 06/20/2009  . CAROTID STENOSIS 06/20/2009    A. 08/2001 s/p L CEA;  B.   09/14/11 - Carotid U/S - 40-59% bilateral stenosis, left CEA patch angioplasty is patent  . DM 06/20/2009  . CAD 06/20/2009    A.  08/2000 - s/p CABG x 4 - LIMA-LAD, Left Radial-OM, VG-DIAG, VG-RCA;  B. Neg. MV  2010  . HYPERLIPIDEMIA 06/20/2009  . HYPERTENSION 06/20/2009  . Overweight   . Hypothyroidism   . Low back pain   . Asthma     as child  . Pneumonia   . Atrial fibrillation     Not felt to be coumadin candidate 2/2 ETOH use.  Marland Kitchen Hypothyroidism   . ETOH abuse   . History of tobacco abuse     remote - quit 1970  . Bilateral renal cysts     Medications:  Prescriptions prior to admission  Medication Sig Dispense Refill  . aspirin 325 MG tablet Take 325 mg by mouth daily.        . cephALEXin (KEFLEX) 500 MG capsule Take 500 mg by mouth 3 (three) times daily.       Marland Kitchen FLUoxetine (PROZAC) 20 MG capsule Take 20 mg by mouth daily.        . fluticasone (FLOVENT  HFA) 220 MCG/ACT inhaler Inhale 1 puff into the lungs 2 (two) times daily.        . furosemide (LASIX) 80 MG tablet Take 1 tablet (80 mg total) by mouth daily.  90 tablet  3  . gabapentin (NEURONTIN) 100 MG capsule Take 100-200 mg by mouth 2 (two) times daily. Takes 1 tab in the morning and 2 at bedtime      . glipiZIDE (GLUCOTROL) 10 MG tablet Take 10 mg by mouth 2 (two) times daily before a meal.      . HYDROmorphone (DILAUDID) 2 MG tablet Take 2-4 mg by mouth every 4 (four) hours as needed. pain      . KLOR-CON M20 20 MEQ tablet 40 mEq daily.       Marland Kitchen levothyroxine (SYNTHROID, LEVOTHROID) 150 MCG tablet Take 150 mcg by mouth daily.        Marland Kitchen LORazepam (ATIVAN) 1 MG tablet Take 1 mg by mouth every 8 (eight) hours as needed. anxiety      . magnesium oxide (MAG-OX) 400 MG tablet Take 400 mg by mouth 2 (two) times daily.      . metoprolol (  LOPRESSOR) 50 MG tablet Take 50 mg by mouth daily.      . Multiple Vitamin (MULTIVITAMIN) capsule Take 1 capsule by mouth daily.      Marland Kitchen omeprazole (PRILOSEC) 20 MG capsule Take 20 mg by mouth daily.        Marland Kitchen senna (SENOKOT) 8.6 MG tablet Take 1 tablet by mouth daily. As needed      . simvastatin (ZOCOR) 40 MG tablet Take 40 mg by mouth at bedtime.        . traZODone (DESYREL) 50 MG tablet Take 50-100 mg by mouth at bedtime as needed. sleep        Assessment: 66 yo man to start heparin for potential UF.  His baseline INR is 1.42. Hg 9.2, PTLC 120 Goal of Therapy:  Heparin level 0.3-0.6 units/ml Monitor platelets by anticoagulation protocol: Yes   Plan:  Start heparin drip at 1300 units/hr. Check heparin level and CBC 8 hours after start.  Vernal Rutan Poteet 09/03/2012,9:39 AM

## 2012-09-03 NOTE — Progress Notes (Addendum)
[facility] PROGRESS NOTE  [name] ZOX:[id number] DOB: 06-16-1946 DOA: 08/30/2012 PCP: [name], MD  Assessment/Plan:  Diastolic heart failure, acute on chronic exacerbation, also severe pulmonary hypertension:  Dry weight is around 244 lbs and currently 278lbs.  Initial troponin elevation likely due to heart strain as was the case in 09/2011.  ECHO 11/20 demonstrated normal systolic function and EF of 55-60% with moderately dilated left atrium.  The PA pressure was severely elevated with moderate TR and moderately dilated RA.   -  Lasix gtt and metolazone started 11/22  - -2L, weight increased by 2kg today? -  Frequent electrolytes -  Per cards, will likely need right heart cath prior to discharge -  Appreciate cardiology recommendations  UTI:   -  F/u culture -  Ceftriaxone -  Exchange foley  -  Pyridium 200mg  x 1.  AMS, resolving:  TSH was mildly elevated, but question patient compliance and continued previous dose of synthroid.  Patient has asterixis on exam despite normal ammonia level.   -  Urinalysis neg -  Vitamin B12 606 -  Vitamin B1 pending -  RPR NR -  Continue lactulose to titrate to 2-3 BMs per day   CAD s/p 4V CABG 2001, neg myoview 2010.  Mild elevation in initial troponin, now normalized - likely due to acute on chronic CHF.   -  Continue asa, statin and BB  Atrial fibrillation, rate controlled -  CHADS2 of 3, but not on warfarin due to alcohol use -  Continue ASA and Carvedilol  AKI, baseline creatinine 0.9, initially 1.9 and trending down with diuresis.  FEurea 20%.  Likely cardiorenal syndrome.    Hypokalemia:  Resolved, but trending down slightly today - cont. Potassium chloride PO BID with additional supplementation prn while diuresing - Will give additional dose of potassium this morning because trending down and diuresing  Transaminitis:  AST: ALT almost 2:1.  May be due to alcohol use yesterday or heart failure:  Improving with  diuresis.  Will need repeat as outpatient.    DM:  fingersticks 141-229, near goal -  Continue SSI  Anemia, normocytic, chronic, but hemoglobin slightly lower than baseline - patient had recent fall with palpable hematoma on left flank with extensive bruising.  Likely due to chronic alcohol use.  Hgb stable around 8.8.    Thrombocytopenia, mild.  Likely due to acute illness.   -  Trend  Hypothyroidism:   -  TSH:  10.1 -  Continue synthroid at current dose - question compliance with medications.  Will need outpatient repeat  Pain, primarily along left flank hematoma -  Percocet as needed -  DC Morphine to minimize narcotics  Constipation:  -  Lactulose increase to TID -  fleets enemas as needed  Multiple abrasions with small amount of erythema, improving.  Likely PVD with poor distal pulses -  TED hose -  Consider ABIs as outpatient  DIET:  Healthy heart ACCESS:  PIV IVF:  None PROPH:  Heparin    Code Status: Full code Family Communication: spoke with patient Disposition Plan: pending diuresis, kidney and liver function improving.  PT consult pending.     Consultants:  Cardiology  Procedures:  None  Antibiotics:  None  HPI/Subjective:  Denies chest pain, nausea, vomiting, diarrhea, and had large loose BM yesterday.  C/o dysuria  Objective: Filed Vitals:   09/02/12 1504 09/02/12 2003 09/03/12 0521 09/03/12 0708  BP: 115/70 127/67 133/75   Pulse: 79 72 74  Temp: 98 F (36.7 C) 97.4 F (36.3 C) 97.3 F (36.3 C)   TempSrc: Oral Oral Oral   Resp: 18 18 20    Height:      Weight:    127.189 kg (280 lb 6.4 oz)  SpO2: 98% 96% 99%     Intake/Output Summary (Last 24 hours) at 09/03/12 0913 Last data filed at 09/03/12 0521  Gross per 24 hour  Intake    720 ml  Output   2801 ml  Net  -2081 ml   Filed Weights   09/01/12 0614 09/02/12 0554 09/03/12 0708  Weight: 128.3 kg (282 lb 13.6 oz) 125 kg (275 lb 9.2 oz) 127.189 kg (280 lb 6.4 oz)     Exam:   General:  CM, no acute distress  Neck:  JVP to preauricular area  Cardiovascular:  Irregularly irregular, HR around 90s.  No m/r/g  Respiratory:  Diminished bilateral bases with rales to the mid-back.    Abdomen: NABS, soft, nondistended, nontender  MSK:  1+ pitting edema bilateral lower extremities, 1+ DP, cool extremities  SKin:  no overt lower extremity cellulitis.    Psych:  A&O to person, place, November, and unsure of year.  Data Reviewed: Basic Metabolic Panel:  Lab 09/03/12 1610 09/02/12 0658 09/01/12 0515 08/31/12 0515 08/30/12 0430  NA 138 140 139 142 142  K 3.5 3.8 3.8 3.2* 4.1  CL 93* 97 96 98 99  CO2 36* 35* 34* 33* 33*  GLUCOSE 146* 153* 157* 113* 66*  BUN 65* 67* 70* 72* 76*  CREATININE 1.21 1.27 1.31 1.37* 1.93*  CALCIUM 9.3 9.2 9.2 9.3 9.2  MG -- -- -- -- --  PHOS -- -- -- -- --   Liver Function Tests:  Lab 09/02/12 0658 09/01/12 0515 08/31/12 0515 08/30/12 0430  AST 68* 97* 128* 191*  ALT 76* 88* 96* 112*  ALKPHOS 79 82 87 99  BILITOT 0.8 0.9 0.8 0.7  PROT 6.7 6.4 6.7 7.1  ALBUMIN 2.8* 2.8* 2.9* 3.1*   No results found for this basename: LIPASE:5,AMYLASE:5 in the last 168 hours  Lab 08/30/12 0430  AMMONIA 27   CBC:  Lab 09/03/12 0605 09/02/12 0658 09/01/12 0515 08/31/12 0515 08/30/12 0430  WBC 6.8 5.2 6.0 6.9 8.1  NEUTROABS -- -- -- -- 5.3  HGB 9.2* 8.8* 8.5* 8.7* 9.0*  HCT 32.2* 31.5* 30.9* 30.8* 31.5*  MCV 89.7 91.3 88.8 87.7 89.2  PLT 120* 148* 179 177 207   Cardiac Enzymes:  Lab 08/30/12 1804 08/30/12 1212 08/30/12 0416  CKTOTAL -- -- --  CKMB -- -- --  CKMBINDEX -- -- --  TROPONINI <0.30 <0.30 <0.30   BNP (last 3 results)  Basename 08/30/12 0416 10/13/11 0552 10/07/11 0407  PROBNP 18070.0* 5170.0* 14242.0*   CBG:  Lab 09/03/12 0612 09/02/12 1619 09/02/12 1125 09/02/12 0633 09/01/12 2230  GLUCAP 144* 184* 166* 156* 229*    No results found for this or any previous visit (from the past 240 hour(s)).    Studies: No results found.  Scheduled Meds:    . aspirin  325 mg Oral Daily  . carvedilol  6.25 mg Oral BID WC  . FLUoxetine  20 mg Oral Daily  . folic acid  1 mg Oral Daily  . furosemide  120 mg Intravenous BID  . gabapentin  300 mg Oral Daily  . heparin subcutaneous  5,000 Units Subcutaneous Q8H  . insulin aspart  0-9 Units Subcutaneous TID WC  . lactulose  30 g  Oral TID  . levothyroxine  150 mcg Oral QAC breakfast  . metolazone  2.5 mg Oral Daily  . multivitamin with minerals  1 tablet Oral Daily  . potassium chloride  20 mEq Oral BID  . [COMPLETED] potassium chloride  20 mEq Oral Once  . simvastatin  40 mg Oral QHS  . sodium chloride  3 mL Intravenous Q12H  . thiamine  100 mg Oral Daily  . [DISCONTINUED] lactulose  30 g Oral BID  . [DISCONTINUED] magnesium citrate  1 Bottle Oral Once  . [DISCONTINUED] potassium chloride  20 mEq Oral Once   Continuous Infusions:   Principal Problem:  *Acute on chronic diastolic CHF (congestive heart failure) Active Problems:  DM  HYPERLIPIDEMIA  HYPERTENSION  CHRONIC OBSTRUCTIVE PULMONARY DISEASE  CAD  ETOH abuse  Atrial fibrillation  Hematoma-on back, sacral area, right chest wall post fall  Acute renal failure  Weakness generalized  Hypothyroidism  Acute respiratory failure with hypoxia  Elevated troponin  Elevated LFTs    Time spent: 30    Hamlet Lasecki, [facility]  [facility] Pager [phone]. If 8PM-8AM, please contact night-coverage at [email or web address], password [facility] 09/03/2012, 9:13 AM  LOS: 4 days

## 2012-09-03 NOTE — Progress Notes (Signed)
Patient ID: MYRAN ARCIA, male   DOB: 1946/02/18, 66 y.o.   MRN: [id number]    Subjective:  Mr. Mcglasson is a 66yo male with PMX s/f CAD (s/p CABG x 4 in 2001, normal Myoview 2010), chronic diastolic CHF, persistent a-fib, DM, HTN, HL, carotid artery disease, COPD, hypothyroidism, EtOH abuse, chronic venous insufficiency and morbid obesity.  He is not anticoagulants due to ETOH abuse and fall risk.   Echo 09/2011: LVEF 45-50%, mod biatral enlargement, moderate RV dilatation, moderate TR, PASP 66 mmHg.  ECHO 08/2012 EF 55-60% moderate bilateral RA/LA enlargement  Admitted 08/30/12 for acute on chronic diastolic CHF. He has been diuresed with IV lasix 40 mg bid.   Admit weight 278 but trending up to 282 pounds. After reviewing office note weights from the last 6 months his weight was 240-250 pounds.   I/Os negative 2L but weight up (done on bed scale). C/o dysuria with Foley.  Denies dyspnea.    Intake/Output Summary (Last 24 hours) at 09/03/12 0918 Last data filed at 09/03/12 0521  Gross per 24 hour  Intake    720 ml  Output   2801 ml  Net  -2081 ml    Current meds:    . aspirin  325 mg Oral Daily  . carvedilol  6.25 mg Oral BID WC  . FLUoxetine  20 mg Oral Daily  . folic acid  1 mg Oral Daily  . furosemide  120 mg Intravenous BID  . gabapentin  300 mg Oral Daily  . heparin subcutaneous  5,000 Units Subcutaneous Q8H  . insulin aspart  0-9 Units Subcutaneous TID WC  . lactulose  30 g Oral TID  . levothyroxine  150 mcg Oral QAC breakfast  . metolazone  2.5 mg Oral Daily  . multivitamin with minerals  1 tablet Oral Daily  . potassium chloride  20 mEq Oral BID  . [COMPLETED] potassium chloride  20 mEq Oral Once  . simvastatin  40 mg Oral QHS  . sodium chloride  3 mL Intravenous Q12H  . thiamine  100 mg Oral Daily  . [DISCONTINUED] lactulose  30 g Oral BID  . [DISCONTINUED] magnesium citrate  1 Bottle Oral Once  . [DISCONTINUED] potassium chloride  20 mEq Oral Once    Infusions:     Objective:  Blood pressure 133/75, pulse 74, temperature 97.3 F (36.3 C), temperature source Oral, resp. rate 20, height 5\' 9"  (1.753 m), weight 127.189 kg (280 lb 6.4 oz), SpO2 99.00%. Weight change:   Physical Exam: General:  Chronically ill. No resp difficulty HEENT: normal Neck: supple. JVP jaw. Carotids 2+ bilat; no bruits. No lymphadenopathy or thryomegaly appreciated. Cor: PMI nondisplaced. Regular rate & rhythm. No rubs, gallops. 1/6 SEM RUSB. Lungs: Crackles at bases bilaterally Abdomen: obese soft, nontender, nondistended. No hepatosplenomegaly. No bruits or masses. Good bowel sounds. Extremities: no cyanosis, clubbing, rash, R and LLE 2-3+ edema   Neuro: alert & orientedx3, cranial nerves grossly intact. moves all 4 extremities w/o difficulty. Affect pleasant   Basic Metabolic Panel:  Lab 09/03/12 0865 09/02/12 0658 09/01/12 0515 08/31/12 0515 08/30/12 0430  NA 138 140 139 142 142  K 3.5 3.8 -- -- --  CL 93* 97 96 98 99  CO2 36* 35* 34* 33* 33*  GLUCOSE 146* 153* 157* 113* 66*  BUN 65* 67* 70* 72* 76*  CREATININE 1.21 1.27 1.31 1.37* 1.93*  CALCIUM 9.3 9.2 9.2 9.3 9.2  MG -- -- -- -- --  PHOS -- -- -- -- --  Liver Function Tests:  Lab 09/02/12 0658 09/01/12 0515 08/31/12 0515 08/30/12 0430  AST 68* 97* 128* 191*  ALT 76* 88* 96* 112*  ALKPHOS 79 82 87 99  BILITOT 0.8 0.9 0.8 0.7  PROT 6.7 6.4 6.7 7.1  ALBUMIN 2.8* 2.8* 2.9* 3.1*   No results found for this basename: LIPASE:5,AMYLASE:5 in the last 168 hours  Lab 08/30/12 0430  AMMONIA 27   CBC:  Lab 09/03/12 0605 09/02/12 0658 09/01/12 0515 08/31/12 0515 08/30/12 0430  WBC 6.8 5.2 6.0 6.9 8.1  NEUTROABS -- -- -- -- 5.3  HGB 9.2* 8.8* 8.5* 8.7* 9.0*  HCT 32.2* 31.5* 30.9* 30.8* 31.5*  MCV 89.7 91.3 88.8 87.7 89.2  PLT 120* 148* 179 177 207   Cardiac Enzymes:  Lab 08/30/12 1804 08/30/12 1212 08/30/12 0416  CKTOTAL -- -- --  CKMB -- -- --  CKMBINDEX -- -- --  TROPONINI <0.30  <0.30 <0.30   BNP: No components found with this basename: POCBNP:5 CBG:  Lab 09/03/12 0612 09/02/12 1619 09/02/12 1125 09/02/12 0633 09/01/12 2230  GLUCAP 144* 184* 166* 156* 229*   Microbiology: Lab Results  Component Value Date   CULT NO GROWTH 10/05/2011   CULT NO GROWTH 5 DAYS 10/05/2011   CULT NO GROWTH 5 DAYS 10/05/2011   CULT NO GROWTH 5 DAYS 06/25/2010   CULT NO GROWTH 5 DAYS 06/25/2010   No results found for this basename: CULT:2,SDES:2 in the last 168 hours  Imaging: No results found.   ASSESSMENT:  1. A/C diastolic heart failure EF 55-60% RV moderately dilated/HK with RV-RA gradient 56 mmHg.  2. Right heart failure 3. A Fib 4. Hypothyroidism 5. Obesity 6. CAD 7. Hematoma 7. Acute renal failure (baseline creatinine 0.8-1.1): suspect cardiorenal  PLAN/DISCUSSION:  Remains markedly volume overloaded. I/Os negative but not as much as I would like. Renal function stable. Will place PICC and switch to IV lasix gtt. Continue metolazone. Also start heparin just in case we need to start UF tomorrow. Check UA and place TED hose. Suspect he will need SNF.  D/W Dr. Malachi Bonds.    LOS: 4 days    Arvilla Meres, MD 09/03/2012, 9:18 AM

## 2012-09-03 NOTE — Progress Notes (Signed)
ANTICOAGULATION CONSULT NOTE - Follow Up Consult  Pharmacy Consult for heparin Indication: preparation for UF  Allergies  Allergen Reactions  . Erythromycin Hives    Patient Measurements: Height: 5\' 9"  (175.3 cm) Weight: 278 lb (126.1 kg) (standing scale) IBW/kg (Calculated) : 70.7  Heparin Dosing Weight: 89 kg  Vital Signs: Temp: 97.3 F (36.3 C) (11/24 2027) Temp src: Oral (11/24 2027) BP: 113/64 mmHg (11/24 2027) Pulse Rate: 83  (11/24 2027)  Labs:  Basename 09/03/12 1938 09/03/12 0605 09/02/12 0658 09/01/12 0515  HGB -- 9.2* 8.8* --  HCT -- 32.2* 31.5* 30.9*  PLT -- 120* 148* 179  APTT -- -- -- --  LABPROT -- -- -- --  INR -- -- -- --  HEPARINUNFRC 0.20* -- -- --  CREATININE -- 1.21 1.27 1.31  CKTOTAL -- -- -- --  CKMB -- -- -- --  TROPONINI -- -- -- --    Estimated Creatinine Clearance: 76.8 ml/min (by C-G formula based on Cr of 1.21).   Medications:  Infusions:    . furosemide (LASIX) infusion 15 mg/hr (09/03/12 1530)  . heparin 1,300 Units/hr (09/03/12 1649)    Assessment: 66 y/o male on heparin in preparation for possible UF tomorrow. Heparin level is subtherapeutic on 1300 units/hr.  Goal of Therapy:  Heparin level 0.3-0.6 units/ml Monitor platelets by anticoagulation protocol: Yes   Plan:  -Increase heparin drip to 1650 units/hr -Heparin level 6 hours after rate change -Heparin level q12h while on UF, CBC daily   [facility], [place].D., BCPS Clinical Pharmacist Pager: [phone] 09/03/2012 10:16 PM

## 2012-09-04 ENCOUNTER — Ambulatory Visit: Payer: Medicare Other | Admitting: Cardiovascular Disease

## 2012-09-04 DIAGNOSIS — I509 Heart failure, unspecified: Secondary | ICD-10-CM

## 2012-09-04 DIAGNOSIS — R5381 Other malaise: Secondary | ICD-10-CM

## 2012-09-04 LAB — BASIC METABOLIC PANEL
BUN: 62 mg/dL — ABNORMAL HIGH (ref 6–23)
Chloride: 91 mEq/L — ABNORMAL LOW (ref 96–112)
GFR calc Af Amer: 71 mL/min — ABNORMAL LOW (ref >90)
GFR calc non Af Amer: 62 mL/min — ABNORMAL LOW (ref >90)
Potassium: 3.1 mEq/L — ABNORMAL LOW (ref 3.5–5.1)
Sodium: 139 mEq/L (ref 135–145)

## 2012-09-04 LAB — CBC
HCT: 30.6 % — ABNORMAL LOW (ref 39.0–52.0)
Hemoglobin: 8.7 g/dL — ABNORMAL LOW (ref 13.0–17.0)
RDW: 19.3 % — ABNORMAL HIGH (ref 11.5–15.5)
WBC: 6.7 10*3/uL (ref 4.0–10.5)

## 2012-09-04 LAB — GLUCOSE, CAPILLARY: Glucose-Capillary: 198 mg/dL — ABNORMAL HIGH (ref 70–99)

## 2012-09-04 MED ORDER — POTASSIUM CHLORIDE CRYS ER 20 MEQ PO TBCR: 20.0000 meq | EXTENDED_RELEASE_TABLET | Freq: Once | ORAL | Status: DC

## 2012-09-04 MED ORDER — SPIRONOLACTONE 12.5 MG HALF TABLET
12.5000 mg | ORAL_TABLET | Freq: Every day | ORAL | Status: DC
  Administered 2012-09-04: 12.5 mg via ORAL
  Filled 2012-09-04 (×2): qty 1

## 2012-09-04 MED ORDER — HEPARIN SODIUM (PORCINE) 5000 UNIT/ML IJ SOLN
5000.0000 [IU] | Freq: Three times a day (TID) | INTRAMUSCULAR | Status: DC
  Administered 2012-09-04 – 2012-09-08 (×13): 5000 [IU] via SUBCUTANEOUS
  Filled 2012-09-04 (×15): qty 1

## 2012-09-04 MED ORDER — LACTULOSE 10 GM/15ML PO SOLN
30.0000 g | Freq: Every day | ORAL | Status: DC
  Administered 2012-09-05 – 2012-09-07 (×3): 30 g via ORAL
  Filled 2012-09-04 (×4): qty 45

## 2012-09-04 MED ORDER — ACETAZOLAMIDE 250 MG PO TABS
250.0000 mg | ORAL_TABLET | Freq: Two times a day (BID) | ORAL | Status: AC
  Administered 2012-09-04 – 2012-09-06 (×6): 250 mg via ORAL
  Filled 2012-09-04 (×7): qty 1

## 2012-09-04 MED ORDER — POTASSIUM CHLORIDE CRYS ER 20 MEQ PO TBCR
40.0000 meq | EXTENDED_RELEASE_TABLET | Freq: Once | ORAL | Status: AC
  Administered 2012-09-04: 40 meq via ORAL

## 2012-09-04 NOTE — Progress Notes (Signed)
Met with patient at bedside and discussed potential CIR admission when pt is medically stable. Pt would benefit from inpatient rehab prior to d/c to home. Patient agrees that he would like to come to CIR. Called pt's daughter and she is in agreement with plan. Pt's daughter lives with him and is available 24/7 as his caregiver. Noted pt continues with medical issues. Will contact Dr Malachi Bonds and request notification when pt is medically ready for inpatient rehab. For questions call [phone].

## 2012-09-04 NOTE — Progress Notes (Signed)
[name],PT Acute Rehabilitation [phone] (pager)  

## 2012-09-04 NOTE — Progress Notes (Signed)
[facility] PROGRESS NOTE  LEVERETT CAMPLIN ZOX:[id number] DOB: 29-Dec-1945 DOA: 08/30/2012 PCP: [name], MD  Assessment/Plan:  Diastolic heart failure, acute on chronic exacerbation, also severe pulmonary hypertension:  Dry weight is around 244 lbs and currently 278lbs.  Initial troponin elevation likely due to heart strain as was the case in 09/2011.  ECHO 11/20 demonstrated normal systolic function and EF of 55-60% with moderately dilated left atrium.  The PA pressure was severely elevated with moderate TR and moderately dilated RA.   -  Lasix gtt and metolazone started 11/22  - -1.77L, weight down 9kg today?  Nursing staff to make sure using standing scale daily -  Frequent electrolytes -  Per cards, will likely need right heart cath prior to discharge -  Appreciate cardiology recommendations  UTI:   -  F/u culture - reflex culture was not sent.  Will send culture today -  Ceftriaxone, day 2 -  Exchange foley catheter.    AMS, resolving:  TSH was mildly elevated, but question patient compliance and continued previous dose of synthroid.  Patient's asterixis is improving.   -  Urinalysis neg -  Vitamin B12 606 -  Vitamin B1 pending -  RPR NR -  Decrease lactulose to once daily due to multiple episodes of diarrhea overnight.     CAD s/p 4V CABG 2001, neg myoview 2010.  Mild elevation in initial troponin, now normalized - likely due to acute on chronic CHF.   -  Continue asa, statin and BB  Atrial fibrillation, rate controlled -  CHADS2 of 3, but not on warfarin due to alcohol use -  Continue ASA and Carvedilol  AKI, baseline creatinine 0.9, initially 1.9 and trending down with diuresis.  FEurea 20%.  Likely cardiorenal syndrome.    Hypokalemia:  Resolved, but trending down slightly today - cont. Potassium chloride PO BID with additional supplementation prn while diuresing - Will give additional dose of potassium this morning because trending down and  diuresing  Transaminitis:  AST: ALT almost 2:1.  May be due to alcohol use yesterday or heart failure:  Improving with diuresis.  Will need repeat as outpatient.    DM:  fingersticks 141-229, near goal -  Continue SSI  Anemia, normocytic, chronic, but hemoglobin slightly lower than baseline - patient had recent fall with palpable hematoma on left flank with extensive bruising.  Likely due to chronic alcohol use.  Hgb stable around 8.8.    Thrombocytopenia, mild.  Likely due to acute illness.   -  Trend  Hypothyroidism:   -  TSH:  10.1 -  Continue synthroid at current dose - question compliance with medications.  Will need outpatient repeat  Pain, primarily along left flank hematoma -  Percocet as needed -  DC Morphine to minimize narcotics  Constipation:  -  Lactulose increase to TID -  fleets enemas as needed  Multiple abrasions with small amount of erythema, improving.  Likely PVD with poor distal pulses -  TED hose -  Consider ABIs as outpatient  DIET:  Healthy heart ACCESS:  PIV IVF:  None PROPH:  Heparin    Code Status: Full code Family Communication: spoke with patient Disposition Plan: pending diuresis, kidney and liver function improving.  CIR pending completion of diuresis     Consultants:  Cardiology  Procedures:  None  Antibiotics:  None  HPI/Subjective:  Denies chest pain, nausea, vomiting, shortness of breath.  Does not remember me today.  States that he had diarrhea overnight.  C/o dysuria that persists.    Objective: Filed Vitals:   09/03/12 1334 09/03/12 2027 09/04/12 0205 09/04/12 0435  BP: 111/62 113/64  117/78  Pulse: 75 83  62  Temp: 97.6 F (36.4 C) 97.3 F (36.3 C)  97.6 F (36.4 C)  TempSrc: Oral Oral  Oral  Resp: 20 16  16   Height:      Weight:   117.346 kg (258 lb 11.2 oz)   SpO2: 90% 97%  99%    Intake/Output Summary (Last 24 hours) at 09/04/12 0941 Last data filed at 09/04/12 0800  Gross per 24 hour  Intake    480 ml   Output   3051 ml  Net  -2571 ml   Filed Weights   09/03/12 0708 09/03/12 0923 09/04/12 0205  Weight: 127.189 kg (280 lb 6.4 oz) 126.1 kg (278 lb) 117.346 kg (258 lb 11.2 oz)    Exam:   General:  CM, no acute distress  Neck:  JVP still to preauricular area  Cardiovascular:  Irregularly irregular, HR around 90s.  No m/r/g  Respiratory:  Diminished bilateral bases with rales at deep bases    Abdomen: NABS, soft, nondistended, nontender  MSK:  TED hose in place. 1+ pitting edema bilateral lower extremities  SKin:  no overt lower extremity cellulitis.    Psych:  A&O to person, place, November, and unsure of year.  Neuro:  No asterixis today  Data Reviewed: Basic Metabolic Panel:  Lab 09/04/12 4098 09/03/12 0605 09/02/12 0658 09/01/12 0515 08/31/12 0515  NA 139 138 140 139 142  K 3.1* 3.5 3.8 3.8 3.2*  CL 91* 93* 97 96 98  CO2 39* 36* 35* 34* 33*  GLUCOSE 162* 146* 153* 157* 113*  BUN 62* 65* 67* 70* 72*  CREATININE 1.18 1.21 1.27 1.31 1.37*  CALCIUM 9.5 9.3 9.2 9.2 9.3  MG -- -- -- -- --  PHOS -- -- -- -- --   Liver Function Tests:  Lab 09/02/12 0658 09/01/12 0515 08/31/12 0515 08/30/12 0430  AST 68* 97* 128* 191*  ALT 76* 88* 96* 112*  ALKPHOS 79 82 87 99  BILITOT 0.8 0.9 0.8 0.7  PROT 6.7 6.4 6.7 7.1  ALBUMIN 2.8* 2.8* 2.9* 3.1*   No results found for this basename: LIPASE:5,AMYLASE:5 in the last 168 hours  Lab 08/30/12 0430  AMMONIA 27   CBC:  Lab 09/04/12 0430 09/03/12 0605 09/02/12 0658 09/01/12 0515 08/31/12 0515 08/30/12 0430  WBC 6.7 6.8 5.2 6.0 6.9 --  NEUTROABS -- -- -- -- -- 5.3  HGB 8.7* 9.2* 8.8* 8.5* 8.7* --  HCT 30.6* 32.2* 31.5* 30.9* 30.8* --  MCV 87.7 89.7 91.3 88.8 87.7 --  PLT 150 120* 148* 179 177 --   Cardiac Enzymes:  Lab 08/30/12 1804 08/30/12 1212 08/30/12 0416  CKTOTAL -- -- --  CKMB -- -- --  CKMBINDEX -- -- --  TROPONINI <0.30 <0.30 <0.30   BNP (last 3 results)  Basename 08/30/12 0416 10/13/11 0552 10/07/11 0407   PROBNP 18070.0* 5170.0* 14242.0*   CBG:  Lab 09/04/12 0552 09/03/12 2226 09/03/12 1627 09/03/12 1123 09/03/12 0612  GLUCAP 177* 180* 173* 187* 144*    No results found for this or any previous visit (from the past 240 hour(s)).   Studies: Dg Chest Port 1 View  09/03/2012  *RADIOLOGY REPORT*  Clinical Data: PICC line placement.  PORTABLE CHEST - 1 VIEW  Comparison: 08/31/2012  Findings: Right upper extremity PICC line tip is in the lower  SVC. Lungs show improved aeration since the prior chest x-ray with suggestion of potentially mild residual interstitial edema.  The heart size is stable.  IMPRESSION: PICC line tip is in the lower SVC.  Improved pulmonary edema.   Original Report Authenticated By: Irish Lack, M.D.     Scheduled Meds:    . acetaZOLAMIDE  250 mg Oral BID  . aspirin  325 mg Oral Daily  . carvedilol  6.25 mg Oral BID WC  . cefTRIAXone (ROCEPHIN)  IV  1 g Intravenous Q24H  . FLUoxetine  20 mg Oral Daily  . folic acid  1 mg Oral Daily  . gabapentin  300 mg Oral Daily  . heparin subcutaneous  5,000 Units Subcutaneous Q8H  . insulin aspart  0-9 Units Subcutaneous TID WC  . lactulose  30 g Oral TID  . levothyroxine  150 mcg Oral QAC breakfast  . metolazone  2.5 mg Oral Daily  . multivitamin with minerals  1 tablet Oral Daily  . [COMPLETED] phenazopyridine  200 mg Oral Once  . potassium chloride  20 mEq Oral BID  . potassium chloride  20 mEq Oral Once  . [COMPLETED] potassium chloride  40 mEq Oral Once  . simvastatin  40 mg Oral QHS  . sodium chloride  10-40 mL Intracatheter Q12H  . sodium chloride  3 mL Intravenous Q12H  . spironolactone  12.5 mg Oral Daily  . thiamine  100 mg Oral Daily   Continuous Infusions:    . furosemide (LASIX) infusion 15 mg/hr (09/04/12 0914)  . [DISCONTINUED] heparin 1,650 Units/hr (09/03/12 2229)    Principal Problem:  *Acute on chronic diastolic CHF (congestive heart failure) Active Problems:  DM  HYPERLIPIDEMIA   HYPERTENSION  CHRONIC OBSTRUCTIVE PULMONARY DISEASE  CAD  ETOH abuse  Atrial fibrillation  Hematoma-on back, sacral area, right chest wall post fall  Acute renal failure  Weakness generalized  Hypothyroidism  Acute respiratory failure with hypoxia  Elevated troponin  Elevated LFTs    Time spent: 30    Esias Mory, [facility]  [facility] Pager [phone]. If 8PM-8AM, please contact night-coverage at [email or web address], password [facility] 09/04/2012, 9:41 AM  LOS: 5 days

## 2012-09-04 NOTE — Consult Note (Signed)
Physical Medicine and Rehabilitation Consult Reason for Consult: Deconditioning/CHF exacerbation Referring Physician: [facility]   HPI: [name] is a 66 y.o. right-handed male with history of COPD, atrial fibrillation but not a candidate for Coumadin secondary to history alcohol use and chronic diastolic CHF. Admitted 08/30/2012 with altered mental status and oxygen saturations 85% on room air. Findings of elevated creatinine 1.93. Cardiac enzymes were unremarkable. Chest x-ray completed showing moderate pulmonary vascular congestion suspect CHF. Echocardiogram with ejection fraction of 60% and moderate RV dilatation, moderate TR. Patient placed on intravenous Lasix with followup cardiology services monitored closely for volume overload. Physical therapy evaluation completed 09/01/2012 and await further ongoing input. Noted patient fatigues easily about some confusion to his initial PT session. Occupational therapy evaluation yet to be ordered. M.D. is requested physical medicine rehabilitation consult to consider inpatient rehabilitation services   Review of Systems  Respiratory: Positive for cough and shortness of breath.   Musculoskeletal: Positive for myalgias and falls.  Neurological: Positive for weakness.  Psychiatric/Behavioral: Positive for depression.  All other systems reviewed and are negative.   Past Medical History  Diagnosis Date  . CHRONIC OBSTRUCTIVE PULMONARY DISEASE 06/20/2009  . OBSTRUCTIVE SLEEP APNEA 06/20/2009  . CAROTID STENOSIS 06/20/2009    A. 08/2001 s/p L CEA;  B.   09/14/11 - Carotid U/S - 40-59% bilateral stenosis, left CEA patch angioplasty is patent  . DM 06/20/2009  . CAD 06/20/2009    A.  08/2000 - s/p CABG x 4 - LIMA-LAD, Left Radial-OM, VG-DIAG, VG-RCA;  B. Neg. MV  2010  . HYPERLIPIDEMIA 06/20/2009  . HYPERTENSION 06/20/2009  . Overweight   . Hypothyroidism   . Low back pain   . Asthma     as child  . Pneumonia   . Atrial fibrillation     Not felt to  be coumadin candidate 2/2 ETOH use.  Marland Kitchen Hypothyroidism   . ETOH abuse   . History of tobacco abuse     remote - quit 1970  . Bilateral renal cysts    Past Surgical History  Procedure Date  . Carotid endarterectomy 2002    left  . Coronary artery bypass graft     x 4 - 2001   Family History  Problem Relation Age of Onset  . Stroke Mother     ?  . Stroke Father     ?   Social History:  reports that he quit smoking about 43 years ago. He does not have any smokeless tobacco history on file. He reports that he drinks alcohol. He reports that he uses illicit drugs (Marijuana). Allergies:  Allergies  Allergen Reactions  . Erythromycin Hives   Medications Prior to Admission  Medication Sig Dispense Refill  . aspirin 325 MG tablet Take 325 mg by mouth daily.        . cephALEXin (KEFLEX) 500 MG capsule Take 500 mg by mouth 3 (three) times daily.       Marland Kitchen FLUoxetine (PROZAC) 20 MG capsule Take 20 mg by mouth daily.        . fluticasone (FLOVENT HFA) 220 MCG/ACT inhaler Inhale 1 puff into the lungs 2 (two) times daily.        . furosemide (LASIX) 80 MG tablet Take 1 tablet (80 mg total) by mouth daily.  90 tablet  3  . gabapentin (NEURONTIN) 100 MG capsule Take 100-200 mg by mouth 2 (two) times daily. Takes 1 tab in the morning and 2 at bedtime      .  glipiZIDE (GLUCOTROL) 10 MG tablet Take 10 mg by mouth 2 (two) times daily before a meal.      . HYDROmorphone (DILAUDID) 2 MG tablet Take 2-4 mg by mouth every 4 (four) hours as needed. pain      . KLOR-CON M20 20 MEQ tablet 40 mEq daily.       Marland Kitchen levothyroxine (SYNTHROID, LEVOTHROID) 150 MCG tablet Take 150 mcg by mouth daily.        Marland Kitchen LORazepam (ATIVAN) 1 MG tablet Take 1 mg by mouth every 8 (eight) hours as needed. anxiety      . magnesium oxide (MAG-OX) 400 MG tablet Take 400 mg by mouth 2 (two) times daily.      . metoprolol (LOPRESSOR) 50 MG tablet Take 50 mg by mouth daily.      . Multiple Vitamin (MULTIVITAMIN) capsule Take 1 capsule  by mouth daily.      Marland Kitchen omeprazole (PRILOSEC) 20 MG capsule Take 20 mg by mouth daily.        Marland Kitchen senna (SENOKOT) 8.6 MG tablet Take 1 tablet by mouth daily. As needed      . simvastatin (ZOCOR) 40 MG tablet Take 40 mg by mouth at bedtime.        . traZODone (DESYREL) 50 MG tablet Take 50-100 mg by mouth at bedtime as needed. sleep        Home: Home Living Lives With: Family;Daughter Available Help at Discharge: Family;Available PRN/intermittently (pt thinks he will have help at home but not certain) Type of Home: House Home Access: Stairs to enter [employer] of Steps: 3 Entrance Stairs-Rails: Can reach both Home Layout: One level Bathroom Shower/Tub: Other (comment) (not established due to lack of pt attention) Home Adaptive Equipment: Walker - rolling;Straight cane  Functional History: Prior Function Able to Take Stairs?: No Functional Status:  Mobility: Bed Mobility Bed Mobility: Supine to Sit;Sitting - Scoot to Edge of Bed Supine to Sit: 3: Mod assist;With rails Sitting - Scoot to Edge of Bed: 3: Mod assist;With rail Transfers Transfers: Sit to Stand;Stand to Sit Sit to Stand: 1: +2 Total assist;With upper extremity assist;From bed Sit to Stand: Patient Percentage: 50% Stand to Sit: 1: +1 Total assist;With upper extremity assist;To chair/3-in-1 (pt = 70%) Ambulation/Gait Ambulation/Gait Assistance: 2: Max assist Ambulation Distance (Feet): 10 Feet Assistive device: Rolling walker Ambulation/Gait Assistance Details: slow, difficulty picking feet up, pt states his Left leg is weaker than right and feels as though it may give out at any point.  VC for safe use of DME.  Gait Pattern: Step-to pattern;Decreased stride length;Decreased hip/knee flexion - right;Decreased hip/knee flexion - left;Decreased dorsiflexion - right;Decreased dorsiflexion - left;Shuffle;Trunk flexed Gait velocity: decr Stairs: No Wheelchair Mobility Wheelchair Mobility: No  ADL:     Cognition: Cognition Arousal/Alertness: Lethargic Orientation Level: Oriented to person;Oriented to place Cognition Overall Cognitive Status: Impaired Area of Impairment: Attention;Memory;Following commands;Safety/judgement;Awareness of deficits Arousal/Alertness: Lethargic Orientation Level: Disoriented to;Time;Situation Behavior During Session: Lethargic Current Attention Level: Focused Memory:  (Decr recall of history and PLOF) Following Commands: Follows one step commands inconsistently Safety/Judgement: Decreased awareness of safety precautions;Decreased awareness of need for assistance;Decreased safety judgement for tasks assessed Cognition - Other Comments: Confused throughout session; difficulty telling time on face clock, difficulty remember date, inconsistant report of PLOF/history  Blood pressure 117/78, pulse 62, temperature 97.6 F (36.4 C), temperature source Oral, resp. rate 16, height 5\' 9"  (1.753 m), weight 117.346 kg (258 lb 11.2 oz), SpO2 99.00%. Physical Exam  Vitals reviewed. Constitutional:  65 year old white male. Patient is a poor historian.  HENT:  Head: Normocephalic.  Eyes:       Pupils round and reactive to light  Neck: Neck supple. No thyromegaly present.  Cardiovascular:       Cardiac rate controlled  Pulmonary/Chest:       Decreased breath sounds at the bases  Abdominal: Soft. Bowel sounds are normal. He exhibits no distension.       Obese  Musculoskeletal:       +1 edema lower extremities  Neurological: He is alert.       Patient was able to give his name and age. He did be subtle cues for date of birth. He followed three-step commands. He was a poor medical historian. Able to lift legs against gravity, generally 3/5 in lower ext. 4/5 in ue's.   Psychiatric:       Flat affect    Results for orders placed during the hospital encounter of 08/30/12 (from the past 24 hour(s))  BASIC METABOLIC PANEL     Status: Abnormal   Collection Time    09/03/12  6:05 AM      Component Value Range   Sodium 138  135 - 145 mEq/L   Potassium 3.5  3.5 - 5.1 mEq/L   Chloride 93 (*) 96 - 112 mEq/L   CO2 36 (*) 19 - 32 mEq/L   Glucose, Bld 146 (*) 70 - 99 mg/dL   BUN 65 (*) 6 - 23 mg/dL   Creatinine, Ser 0.34  0.50 - 1.35 mg/dL   Calcium 9.3  8.4 - 74.2 mg/dL   GFR calc non Af Amer 60 (*) >90 mL/min   GFR calc Af Amer 69 (*) >90 mL/min  CBC     Status: Abnormal   Collection Time   09/03/12  6:05 AM      Component Value Range   WBC 6.8  4.0 - 10.5 K/uL   RBC 3.59 (*) 4.22 - 5.81 MIL/uL   Hemoglobin 9.2 (*) 13.0 - 17.0 g/dL   HCT 59.5 (*) 63.8 - 75.6 %   MCV 89.7  78.0 - 100.0 fL   MCH 25.6 (*) 26.0 - 34.0 pg   MCHC 28.6 (*) 30.0 - 36.0 g/dL   RDW 43.3 (*) 29.5 - 18.8 %   Platelets 120 (*) 150 - 400 K/uL  GLUCOSE, CAPILLARY     Status: Abnormal   Collection Time   09/03/12  6:12 AM      Component Value Range   Glucose-Capillary 144 (*) 70 - 99 mg/dL  URINALYSIS, ROUTINE W REFLEX MICROSCOPIC     Status: Abnormal   Collection Time   09/03/12  9:40 AM      Component Value Range   Color, Urine YELLOW  YELLOW   APPearance CLOUDY (*) CLEAR   Specific Gravity, Urine 1.009  1.005 - 1.030   pH 6.5  5.0 - 8.0   Glucose, UA NEGATIVE  NEGATIVE mg/dL   Hgb urine dipstick LARGE (*) NEGATIVE   Bilirubin Urine NEGATIVE  NEGATIVE   Ketones, ur NEGATIVE  NEGATIVE mg/dL   Protein, ur 416 (*) NEGATIVE mg/dL   Urobilinogen, UA 1.0  0.0 - 1.0 mg/dL   Nitrite NEGATIVE  NEGATIVE   Leukocytes, UA MODERATE (*) NEGATIVE  URINE MICROSCOPIC-ADD ON     Status: Normal   Collection Time   09/03/12  9:40 AM      Component Value Range   WBC, UA 21-50  <3 WBC/hpf   RBC /  HPF 21-50  <3 RBC/hpf   Bacteria, UA RARE  RARE  GLUCOSE, CAPILLARY     Status: Abnormal   Collection Time   09/03/12 11:23 AM      Component Value Range   Glucose-Capillary 187 (*) 70 - 99 mg/dL   Comment 1 Documented in Chart     Comment 2 Notify RN    GLUCOSE, CAPILLARY      Status: Abnormal   Collection Time   09/03/12  4:27 PM      Component Value Range   Glucose-Capillary 173 (*) 70 - 99 mg/dL   Comment 1 Documented in Chart     Comment 2 Notify RN    HEPARIN LEVEL (UNFRACTIONATED)     Status: Abnormal   Collection Time   09/03/12  7:38 PM      Component Value Range   Heparin Unfractionated 0.20 (*) 0.30 - 0.70 IU/mL  GLUCOSE, CAPILLARY     Status: Abnormal   Collection Time   09/03/12 10:26 PM      Component Value Range   Glucose-Capillary 180 (*) 70 - 99 mg/dL   Dg Chest Port 1 View  09/03/2012  *RADIOLOGY REPORT*  Clinical Data: PICC line placement.  PORTABLE CHEST - 1 VIEW  Comparison: 08/31/2012  Findings: Right upper extremity PICC line tip is in the lower SVC. Lungs show improved aeration since the prior chest x-ray with suggestion of potentially mild residual interstitial edema.  The heart size is stable.  IMPRESSION: PICC line tip is in the lower SVC.  Improved pulmonary edema.   Original Report Authenticated By: Irish Lack, M.D.     Assessment/Plan: Diagnosis: deconditioning related to CHF and multiple medical  1. Does the need for close, 24 hr/day medical supervision in concert with the patient's rehab needs make it unreasonable for this patient to be served in a less intensive setting? Yes 2. Co-Morbidities requiring supervision/potential complications: cad, dm, htn, copd, etoh abuse, afib 3. Due to bladder management, bowel management, safety, skin/wound care, disease management, medication administration, pain management and patient education, does the patient require 24 hr/day rehab nursing? Yes and Potentially 4. Does the patient require coordinated care of a physician, rehab nurse, PT (1-2 hrs/day, 5 days/week) and OT (1-2 hrs/day, 5 days/week) to address physical and functional deficits in the context of the above medical diagnosis(es)? Yes Addressing deficits in the following areas: balance, endurance, locomotion, strength,  transferring, bowel/bladder control, bathing, dressing, feeding, grooming, toileting, cognition and psychosocial support 5. Can the patient actively participate in an intensive therapy program of at least 3 hrs of therapy per day at least 5 days per week? Yes 6. The potential for patient to make measurable gains while on inpatient rehab is excellent 7. Anticipated functional outcomes upon discharge from inpatient rehab are mod I to supervision with PT, mod I to min assist with OT, n/a with SLP. 8. Estimated rehab length of stay to reach the above functional goals is: 7-10 days 9. Does the patient have adequate social supports to accommodate these discharge functional goals? Yes 10. Anticipated D/C setting: Home 11. Anticipated post D/C treatments: HH therapy 12. Overall Rehab/Functional Prognosis: excellent  RECOMMENDATIONS: This patient's condition is appropriate for continued rehabilitative care in the following setting: CIR Patient has agreed to participate in recommended program. Potentially--need to make sure he is willing to come. He would like to think it over. Note that insurance prior authorization may be required for reimbursement for recommended care.  Comment: Rehab RN to follow  up for patient decision and medical issues.    Ivory Broad, MD    09/04/2012

## 2012-09-04 NOTE — Progress Notes (Addendum)
Advanced Heart Failure Rounding Note  Subjective:  [name] is a 66yo male with PMX s/f CAD (s/p CABG x 4 in 2001, normal Myoview 2010), chronic diastolic CHF, persistent a-fib, DM, HTN, HL, carotid artery disease, COPD, hypothyroidism, EtOH abuse, chronic venous insufficiency and morbid obesity.  He is not anticoagulants due to ETOH abuse and fall risk.   Echo 09/2011: LVEF 45-50%, mod biatral enlargement, moderate RV dilatation, moderate TR, PASP 66 mmHg.  ECHO 08/2012 EF 55-60% moderate bilateral RA/LA enlargement  Admitted 08/30/12 for acute on chronic diastolic CHF. He has been diuresed with IV lasix 40 mg bid.   Admit weight 278 but trending up to 282 pounds. After reviewing office note weights from the last 6 months his weight was 240-250 pounds.   I/Os negative 1.7L.  Weight down 258 lbs today (standing scale).   Feels pretty good except for burning around foley.  Denies dyspnea, orthopnea, PND.  No chest pain.    Intake/Output Summary (Last 24 hours) at 09/04/12 0852 Last data filed at 09/04/12 8469  Gross per 24 hour  Intake    480 ml  Output   2251 ml  Net  -1771 ml    Current meds:    . aspirin  325 mg Oral Daily  . carvedilol  6.25 mg Oral BID WC  . cefTRIAXone (ROCEPHIN)  IV  1 g Intravenous Q24H  . FLUoxetine  20 mg Oral Daily  . folic acid  1 mg Oral Daily  . gabapentin  300 mg Oral Daily  . insulin aspart  0-9 Units Subcutaneous TID WC  . lactulose  30 g Oral TID  . levothyroxine  150 mcg Oral QAC breakfast  . metolazone  2.5 mg Oral Daily  . multivitamin with minerals  1 tablet Oral Daily  . [COMPLETED] phenazopyridine  200 mg Oral Once  . potassium chloride  20 mEq Oral BID  . [COMPLETED] potassium chloride  40 mEq Oral Once  . simvastatin  40 mg Oral QHS  . sodium chloride  10-40 mL Intracatheter Q12H  . sodium chloride  3 mL Intravenous Q12H  . thiamine  100 mg Oral Daily  . [DISCONTINUED] furosemide  120 mg Intravenous BID  . [DISCONTINUED] heparin  subcutaneous  5,000 Units Subcutaneous Q8H   Infusions:    . furosemide (LASIX) infusion 15 mg/hr (09/03/12 1530)  . heparin 1,650 Units/hr (09/03/12 2229)     Objective:  Blood pressure 117/78, pulse 62, temperature 97.6 F (36.4 C), temperature source Oral, resp. rate 16, height 5\' 9"  (1.753 m), weight 117.346 kg (258 lb 11.2 oz), SpO2 99.00%. Weight change:   Physical Exam: General:  Chronically ill. No resp difficulty HEENT: normal Neck: supple. JVP difficult to see. Carotids 2+ bilat; no bruits. No lymphadenopathy or thryomegaly appreciated. Cor: PMI nondisplaced. Regular rate & rhythm. No rubs, gallops. 1/6 SEM RUSB. Lungs: Crackles at bases bilaterally Abdomen: obese soft, nontender, nondistended. No hepatosplenomegaly. No bruits or masses. Good bowel sounds. Extremities: no cyanosis, clubbing, rash, R and LLE 1-2+ edema   Neuro: alert & orientedx3, cranial nerves grossly intact. moves all 4 extremities w/o difficulty. Affect pleasant   Basic Metabolic Panel:  Lab 09/04/12 6295 09/03/12 0605 09/02/12 0658 09/01/12 0515 08/31/12 0515  NA 139 138 140 139 142  K 3.1* 3.5 -- -- --  CL 91* 93* 97 96 98  CO2 39* 36* 35* 34* 33*  GLUCOSE 162* 146* 153* 157* 113*  BUN 62* 65* 67* 70* 72*  CREATININE 1.18  1.21 1.27 1.31 1.37*  CALCIUM 9.5 9.3 9.2 9.2 9.3  MG -- -- -- -- --  PHOS -- -- -- -- --   Liver Function Tests:  Lab 09/02/12 0658 09/01/12 0515 08/31/12 0515 08/30/12 0430  AST 68* 97* 128* 191*  ALT 76* 88* 96* 112*  ALKPHOS 79 82 87 99  BILITOT 0.8 0.9 0.8 0.7  PROT 6.7 6.4 6.7 7.1  ALBUMIN 2.8* 2.8* 2.9* 3.1*   No results found for this basename: LIPASE:5,AMYLASE:5 in the last 168 hours  Lab 08/30/12 0430  AMMONIA 27   CBC:  Lab 09/04/12 0430 09/03/12 0605 09/02/12 0658 09/01/12 0515 08/31/12 0515 08/30/12 0430  WBC 6.7 6.8 5.2 6.0 6.9 --  NEUTROABS -- -- -- -- -- 5.3  HGB 8.7* 9.2* 8.8* 8.5* 8.7* --  HCT 30.6* 32.2* 31.5* 30.9* 30.8* --  MCV 87.7  89.7 91.3 88.8 87.7 --  PLT 150 120* 148* 179 177 --   Cardiac Enzymes:  Lab 08/30/12 1804 08/30/12 1212 08/30/12 0416  CKTOTAL -- -- --  CKMB -- -- --  CKMBINDEX -- -- --  TROPONINI <0.30 <0.30 <0.30   BNP: No components found with this basename: POCBNP:5 CBG:  Lab 09/04/12 0552 09/03/12 2226 09/03/12 1627 09/03/12 1123 09/03/12 0612  GLUCAP 177* 180* 173* 187* 144*   Microbiology: Lab Results  Component Value Date   CULT NO GROWTH 10/05/2011   CULT NO GROWTH 5 DAYS 10/05/2011   CULT NO GROWTH 5 DAYS 10/05/2011   CULT NO GROWTH 5 DAYS 06/25/2010   CULT NO GROWTH 5 DAYS 06/25/2010   No results found for this basename: CULT:2,SDES:2 in the last 168 hours  Imaging: Dg Chest Port 1 View  09/03/2012  *RADIOLOGY REPORT*  Clinical Data: PICC line placement.  PORTABLE CHEST - 1 VIEW  Comparison: 08/31/2012  Findings: Right upper extremity PICC line tip is in the lower SVC. Lungs show improved aeration since the prior chest x-ray with suggestion of potentially mild residual interstitial edema.  The heart size is stable.  IMPRESSION: PICC line tip is in the lower SVC.  Improved pulmonary edema.   Original Report Authenticated By: Irish Lack, M.D.      ASSESSMENT:  1. A/C diastolic heart failure EF 55-60% RV moderately dilated/HK with RV-RA gradient 56 mmHg.  2. Right heart failure 3. A Fib 4. Hypothyroidism 5. Obesity 6. CAD 7. Hematoma 8. Acute renal failure (baseline creatinine 0.8-1.1): suspect cardiorenal - improving 9. Hypokalemia  PLAN/DISCUSSION:  Volume status improving on IV lasix 15 mg/hr. Renal function improving. Mildly alkalotic. Continue lasix drip as he likely has another 10-15 pounds on board. Add diamox. Supp K+  Will hold off on UF at this time. Agree with CIR.   Consider adding spiro soon.   LOS: 5 days

## 2012-09-04 NOTE — Progress Notes (Signed)
Physical Therapy Treatment Patient Details Name: [name] MRN: [id number] DOB: November 13, 1945 Today's Date: 09/04/2012 Time: 4098-1191 PT Time Calculation (min): 26 min  PT Assessment / Plan / Recommendation Comments on Treatment Session  Pt with recent exacerbation of CHF.  Pt waiting to transfer to CIR with improved cognition and gait today.  Pt eager to learn exercises.  He will continue to benefit from skilled therapy to improve strength, balance, gait, and safety.     Follow Up Recommendations  CIR     Does the patient have the potential to tolerate intense rehabilitation   yes, recommend CIR     Equipment Recommendations  None recommended by PT       Frequency Min 3X/week   Plan Discharge plan needs to be updated;Frequency remains appropriate    Precautions / Restrictions Precautions Precautions: Fall Precaution Comments: Pt states he is very fearful that he will fall Restrictions Weight Bearing Restrictions: No   Pertinent Vitals/Pain HR stable O2 sats: 90% on RA with ambulation, 92% with 2LO2 via [place] Denies pain.    Mobility  Bed Mobility Bed Mobility: Rolling Left;Left Sidelying to Sit;Sitting - Scoot to [place] of Bed Rolling Left: 3: Mod assist;With rail Left Sidelying to Sit: 3: Mod assist;With rails;HOB elevated Supine to Sit: 3: Mod assist;With rails;HOB elevated Sitting - Scoot to Edge of Bed: 3: Mod assist;With rail Details for Bed Mobility Assistance: Improved movement, still slow Transfers Transfers: Sit to Stand;Stand to Sit Sit to Stand: 1: +2 Total assist;With upper extremity assist;From bed Sit to Stand: Patient Percentage: 60% Stand to Sit: 1: +2 Total assist;With upper extremity assist;To chair/3-in-1;With armrests Stand to Sit: Patient Percentage: 50% Details for Transfer Assistance: cues for hand placement, body positioning, and encouragement.  pt with limited control in stand to sit; sits too quickly. Ambulation/Gait Ambulation/Gait  Assistance: 2: Max assist Ambulation Distance (Feet): 25 Feet Assistive device: Rolling walker Ambulation/Gait Assistance Details: slow, shuffled, but with improved posture compared to 09/02/12.  vc for posture, standing close to RW and manipulation of RW/placement Gait Pattern: Step-to pattern;Decreased stride length;Decreased hip/knee flexion - right;Decreased hip/knee flexion - left;Decreased dorsiflexion - right;Decreased dorsiflexion - left;Shuffle;Trunk flexed Gait velocity: decr Stairs: No Wheelchair Mobility Wheelchair Mobility: No    Exercises General Exercises - Lower Extremity Ankle Circles/Pumps: AROM;20 reps;Both;Seated Long Arc Quad: AROM;Seated;Both;10 reps Heel Slides: AROM;Both;10 reps;Seated Hip ABduction/ADduction: AROM;Both;10 reps;Seated Hip Flexion/Marching: AROM;Both;10 reps;Seated Toe Raises: AROM;Both;20 reps;Seated     PT Goals Acute Rehab PT Goals PT Goal: Supine/Side to Sit - Progress: Progressing toward goal PT Goal: Sit to Stand - Progress: Progressing toward goal PT Goal: Stand to Sit - Progress: Progressing toward goal PT Transfer Goal: Bed to Chair/Chair to Bed - Progress: Progressing toward goal PT Goal: Ambulate - Progress: Progressing toward goal  Visit Information  Last PT Received On: 09/04/12 Assistance Needed: +2    Subjective Data  Subjective: "I haven't been up yet" Patient Stated Goal: To go home   Cognition  Overall Cognitive Status: Appears within functional limits for tasks assessed/performed Area of Impairment:  (Much improved from 09/01/12) Arousal/Alertness: Awake/alert Orientation Level: Oriented X4 / Intact Behavior During Session: [facility] for tasks performed    Balance   Static sitting and standing balance improved.   Balance assistance: stand by (sitting), Max (standing)  Comments: pt able to maintain sitting balance without LOB, standing with bilat. UE support during walking, req max assist for few balance checks.   End  of Session PT - End of Session  Equipment Utilized During Treatment: Gait belt Activity Tolerance: Patient limited by fatigue Patient left: in chair;with call bell/phone within reach Nurse Communication: Mobility status       Sharion Balloon 09/04/2012, 4:17 PM  Sharion Balloon, SPT Acute Rehab Services [phone]

## 2012-09-05 DIAGNOSIS — B965 Pseudomonas (aeruginosa) (mallei) (pseudomallei) as the cause of diseases classified elsewhere: Secondary | ICD-10-CM | POA: Diagnosis present

## 2012-09-05 LAB — CBC
HCT: 29.4 % — ABNORMAL LOW (ref 39.0–52.0)
MCV: 88.6 fL (ref 78.0–100.0)
RDW: 19.4 % — ABNORMAL HIGH (ref 11.5–15.5)
WBC: 5.6 10*3/uL (ref 4.0–10.5)

## 2012-09-05 LAB — BASIC METABOLIC PANEL
BUN: 59 mg/dL — ABNORMAL HIGH (ref 6–23)
CO2: 39 mEq/L — ABNORMAL HIGH (ref 19–32)
Chloride: 90 mEq/L — ABNORMAL LOW (ref 96–112)
Creatinine, Ser: 1.13 mg/dL (ref 0.50–1.35)
GFR calc Af Amer: 75 mL/min — ABNORMAL LOW (ref >90)

## 2012-09-05 LAB — GLUCOSE, CAPILLARY
Glucose-Capillary: 189 mg/dL — ABNORMAL HIGH (ref 70–99)
Glucose-Capillary: 200 mg/dL — ABNORMAL HIGH (ref 70–99)
Glucose-Capillary: 183 mg/dL — ABNORMAL HIGH (ref 70–99)
Glucose-Capillary: 173 mg/dL — ABNORMAL HIGH (ref 70–99)

## 2012-09-05 MED ORDER — SPIRONOLACTONE 25 MG PO TABS
25.0000 mg | ORAL_TABLET | Freq: Every day | ORAL | Status: DC
  Administered 2012-09-05 – 2012-09-08 (×4): 25 mg via ORAL
  Filled 2012-09-05 (×4): qty 1

## 2012-09-05 MED ORDER — INSULIN GLARGINE 100 UNIT/ML ~~LOC~~ SOLN
10.0000 [IU] | Freq: Every day | SUBCUTANEOUS | Status: DC
  Administered 2012-09-05 – 2012-09-07 (×3): 10 [IU] via SUBCUTANEOUS

## 2012-09-05 MED ORDER — PIPERACILLIN-TAZOBACTAM 3.375 G IVPB
3.3750 g | Freq: Three times a day (TID) | INTRAVENOUS | Status: DC
  Administered 2012-09-05 – 2012-09-07 (×5): 3.375 g via INTRAVENOUS
  Filled 2012-09-05 (×7): qty 50

## 2012-09-05 MED ORDER — POTASSIUM CHLORIDE CRYS ER 20 MEQ PO TBCR
40.0000 meq | EXTENDED_RELEASE_TABLET | Freq: Two times a day (BID) | ORAL | Status: DC
  Administered 2012-09-05 (×2): 40 meq via ORAL
  Filled 2012-09-05: qty 1
  Filled 2012-09-05: qty 2
  Filled 2012-09-05: qty 1
  Filled 2012-09-05: qty 2

## 2012-09-05 NOTE — Progress Notes (Signed)
Transferred in from 2000 awake and alert. Made comfortable on bed.

## 2012-09-05 NOTE — Progress Notes (Addendum)
[facility] PROGRESS NOTE  [name] ZOX:[id number] DOB: 09/01/1946 DOA: 08/30/2012 PCP: [name], MD  66 yo male with history of chf, copd, etoh abuse, ckd, cafib, freq falls with recent large hematoma to flank area presented to [facility] with acute on chronic heart failure with cardiac hepatitis and cardiorenal syndrome and trop of 0.78.  He was transferred for further management.  He has been diuresed with lasix gtt and metolazone but has been transferred to stepdown for UF by Dr. [name].  His transaminitis and AKI are resolving.  Patient's AMS was likely medication-related in setting of AKI and sedating medications were minimized.  He had asterixis on exam which improved with lactulose.  Mentation markedly improved.  Found to have pseudomonas UTI today.  Hematomas on back and left leg pain are chronic.   Assessment/Plan:  Diastolic heart failure, acute on chronic exacerbation, also severe pulmonary hypertension:  Dry weight is around 244 lbs and currently 278lbs.  Initial troponin elevation likely due to heart strain as was the case in 09/2011.  ECHO 11/20 demonstrated normal systolic function and EF of 55-60% with moderately dilated left atrium.  The PA pressure was severely elevated with moderate TR and moderately dilated RA.   -  Lasix gtt and metolazone started 11/22  - Neg 4L yesterday, weight clearly inaccurate.  -  Frequent electrolytes -  Per cards, will likely need right heart cath prior to discharge -  Starting UF today  Pseudomonas UTI:  Dysuria not improved with ceftriaxone and pyridium and culture growing Pseudomonas.  BP low normal. -  Escalate to zosyn and narrow when sensitivities return -  Foley catheter was exchanged  AMS, resolving:  Likely medication side effect, but patient also had asterixis despite normal ammonia level.  TSH was mildly elevated, but question patient compliance and continued previous dose of synthroid.  Patient's asterixis has  resolved with lactulose.   -  Vitamin B12 606 -  Vitamin B1 24, normal - DC thiamine -  RPR NR -  Continue lactulose  CAD s/p 4V CABG 2001, neg myoview 2010.  Mild elevation in initial troponin, now normalized - likely due to acute on chronic CHF.   -  Continue asa, statin and BB  Atrial fibrillation, rate controlled -  CHADS2 of 3, but not on warfarin due to alcohol use -  Continue ASA and Carvedilol  Cardiorenal syndrome:  baseline creatinine 0.9, initially 1.9 and trending down with diuresis.  FEurea 20%.    Hypokalemia:   - KCL PO BID with additional supplementation prn   Transaminitis:  AST: ALT almost 2:1.  May have been due to alcohol use yesterday or heart failure:  Improved with diuresis.  Will need repeat as outpatient.    DM:  fingersticks mildly elevated -  Start lantus 10 units -  Continue SSI  Anemia, normocytic, chronic, but hemoglobin slightly lower than baseline - patient had recent fall with palpable hematoma on left flank with extensive bruising.  Likely due to chronic alcohol use.  Hgb stable around 8.8.    Thrombocytopenia, mild.  Likely due to acute illness and will likely fall more with UF. -  Trend  Hypothyroidism:   -  TSH:  10.1 -  Continue synthroid at current dose - question compliance with medications.  Will need outpatient repeat  Pain, primarily along left flank hematoma and left leg (chronic):  Percocet as needed  DIET:  Healthy heart ACCESS:  PICC IVF:  None PROPH:  Heparin  Code Status: Full code Family Communication: spoke with patient Disposition Plan: pending diuresis, kidney and liver function improving.  CIR pending completion of diuresis     Consultants:  Cardiology  Procedures:  PICC 11/24  Antibiotics:  Ceftriaxone 11/24 >> 11/26  Zosyn 11/26 >>  HPI/Subjective:  Denies chest pain, nausea, vomiting, shortness of breath, diarrhea, constipation.  C/o dysuria that persists.    Objective: Filed Vitals:    09/05/12 1400 09/05/12 1522 09/05/12 1600 09/05/12 1900  BP: 101/62 119/58 109/57 100/57  Pulse: 84 87 62 62  Temp: 97.1 F (36.2 C) 97 F (36.1 C)    TempSrc: Oral Oral    Resp: 17 17 15 16   Height:      Weight:      SpO2: 95% 98% 99% 100%    Intake/Output Summary (Last 24 hours) at 09/05/12 1916 Last data filed at 09/05/12 1800  Gross per 24 hour  Intake   1030 ml  Output   4725 ml  Net  -3695 ml   Filed Weights   09/05/12 0500 09/05/12 0605 09/05/12 0904  Weight: 123.016 kg (271 lb 3.2 oz) 123.016 kg (271 lb 3.2 oz) 124.1 kg (273 lb 9.5 oz)    Exam:   General:  CM, no acute distress  Neck:  JVP still to preauricular area  Cardiovascular:  Irregularly irregular, HR around 90s.  No m/r/g  Respiratory:  Diminished bilateral bases with rales at bases    Abdomen: NABS, soft, nondistended, nontender  MSK:  TED hose in place. 1+ pitting edema bilateral lower extremities   Psych:  A&O to person, place, November, 13 thousand, and unsure of date.  Neuro:  No asterixis today  Data Reviewed: Basic Metabolic Panel:  Lab 09/05/12 1610 09/04/12 0430 09/03/12 0605 09/02/12 0658 09/01/12 0515  NA 136 139 138 140 139  K 3.2* 3.1* 3.5 3.8 3.8  CL 90* 91* 93* 97 96  CO2 39* 39* 36* 35* 34*  GLUCOSE 182* 162* 146* 153* 157*  BUN 59* 62* 65* 67* 70*  CREATININE 1.13 1.18 1.21 1.27 1.31  CALCIUM 9.4 9.5 9.3 9.2 9.2  MG -- -- -- -- --  PHOS -- -- -- -- --   Liver Function Tests:  Lab 09/02/12 0658 09/01/12 0515 08/31/12 0515 08/30/12 0430  AST 68* 97* 128* 191*  ALT 76* 88* 96* 112*  ALKPHOS 79 82 87 99  BILITOT 0.8 0.9 0.8 0.7  PROT 6.7 6.4 6.7 7.1  ALBUMIN 2.8* 2.8* 2.9* 3.1*   No results found for this basename: LIPASE:5,AMYLASE:5 in the last 168 hours  Lab 08/30/12 0430  AMMONIA 27   CBC:  Lab 09/05/12 0500 09/04/12 0430 09/03/12 0605 09/02/12 0658 09/01/12 0515 08/30/12 0430  WBC 5.6 6.7 6.8 5.2 6.0 --  NEUTROABS -- -- -- -- -- 5.3  HGB 8.4* 8.7* 9.2*  8.8* 8.5* --  HCT 29.4* 30.6* 32.2* 31.5* 30.9* --  MCV 88.6 87.7 89.7 91.3 88.8 --  PLT 132* 150 120* 148* 179 --   Cardiac Enzymes:  Lab 08/30/12 1804 08/30/12 1212 08/30/12 0416  CKTOTAL -- -- --  CKMB -- -- --  CKMBINDEX -- -- --  TROPONINI <0.30 <0.30 <0.30   BNP (last 3 results)  Basename 08/30/12 0416 10/13/11 0552 10/07/11 0407  PROBNP 18070.0* 5170.0* 14242.0*   CBG:  Lab 09/05/12 1620 09/05/12 1117 09/05/12 0602 09/04/12 2049 09/04/12 1639  GLUCAP 183* 200* 189* 198* 141*    Recent Results (from the past 240 hour(s))  URINE CULTURE     Status: Normal (Preliminary result)   Collection Time   09/04/12  1:15 PM      Component Value Range Status Comment   Specimen Description URINE, CLEAN CATCH   Final    Special Requests NONE   Final    Culture  Setup Time 09/04/2012 13:37   Final    Colony Count >=100,000 COLONIES/ML   Final    Culture PSEUDOMONAS AERUGINOSA   Final    Report Status PENDING   Incomplete   MRSA PCR SCREENING     Status: Normal   Collection Time   09/05/12  3:29 PM      Component Value Range Status Comment   MRSA by PCR NEGATIVE  NEGATIVE Final      Studies: No results found.  Scheduled Meds:    . acetaZOLAMIDE  250 mg Oral BID  . aspirin  325 mg Oral Daily  . carvedilol  6.25 mg Oral BID WC  . cefTRIAXone (ROCEPHIN)  IV  1 g Intravenous Q24H  . FLUoxetine  20 mg Oral Daily  . folic acid  1 mg Oral Daily  . gabapentin  300 mg Oral Daily  . heparin subcutaneous  5,000 Units Subcutaneous Q8H  . insulin aspart  0-9 Units Subcutaneous TID WC  . lactulose  30 g Oral Daily  . levothyroxine  150 mcg Oral QAC breakfast  . metolazone  2.5 mg Oral Daily  . multivitamin with minerals  1 tablet Oral Daily  . potassium chloride  40 mEq Oral BID  . simvastatin  40 mg Oral QHS  . sodium chloride  10-40 mL Intracatheter Q12H  . sodium chloride  3 mL Intravenous Q12H  . spironolactone  25 mg Oral Daily  . thiamine  100 mg Oral Daily  .  [DISCONTINUED] potassium chloride  20 mEq Oral BID  . [DISCONTINUED] spironolactone  12.5 mg Oral Daily   Continuous Infusions:    . furosemide (LASIX) infusion 15 mg/hr (09/05/12 0304)    Principal Problem:  *Acute on chronic diastolic CHF (congestive heart failure) Active Problems:  DM  HYPERLIPIDEMIA  HYPERTENSION  CHRONIC OBSTRUCTIVE PULMONARY DISEASE  CAD  ETOH abuse  Atrial fibrillation  Hematoma-on back, sacral area, right chest wall post fall  Acute renal failure  Weakness generalized  Hypothyroidism  Acute respiratory failure with hypoxia  Elevated troponin  Elevated LFTs    Time spent: 30    Columbia Pandey, [facility]  [facility] Pager [phone]. If 8PM-8AM, please contact night-coverage at [email or web address], password [email or web address] 09/05/2012, 7:16 PM  LOS: 6 days

## 2012-09-05 NOTE — Progress Notes (Signed)
Voided thru urinal in standing  position with assistance.

## 2012-09-05 NOTE — Progress Notes (Signed)
Advanced Heart Failure Rounding Note  Subjective:  [name] is a 66yo male with PMX s/f CAD (s/p CABG x 4 in 2001, normal Myoview 2010), chronic diastolic CHF, persistent a-fib, DM, HTN, HL, carotid artery disease, COPD, hypothyroidism, EtOH abuse, chronic venous insufficiency and morbid obesity.  He is not anticoagulants due to ETOH abuse and fall risk.   Echo 09/2011: LVEF 45-50%, mod biatral enlargement, moderate RV dilatation, moderate TR, PASP 66 mmHg.  ECHO 08/2012 EF 55-60% moderate bilateral RA/LA enlargement  Admitted 08/30/12 for acute on chronic diastolic CHF. He has been diuresed with IV lasix 40 mg bid.   Admit weight 278 but trending up to 282 pounds. After reviewing office note weights from the last 6 months his weight was 240-250 pounds.   Unsure I/Os are accurate.  Weights also very variable.  Continues to have burning around catheter .  Denies dyspnea, orthopnea, PND.  No chest pain.  Diamox started yesterday   Intake/Output Summary (Last 24 hours) at 09/05/12 0909 Last data filed at 09/05/12 0606  Gross per 24 hour  Intake   3870 ml  Output   1200 ml  Net   2670 ml    Current meds:    . acetaZOLAMIDE  250 mg Oral BID  . aspirin  325 mg Oral Daily  . carvedilol  6.25 mg Oral BID WC  . cefTRIAXone (ROCEPHIN)  IV  1 g Intravenous Q24H  . FLUoxetine  20 mg Oral Daily  . folic acid  1 mg Oral Daily  . gabapentin  300 mg Oral Daily  . heparin subcutaneous  5,000 Units Subcutaneous Q8H  . insulin aspart  0-9 Units Subcutaneous TID WC  . lactulose  30 g Oral Daily  . levothyroxine  150 mcg Oral QAC breakfast  . metolazone  2.5 mg Oral Daily  . multivitamin with minerals  1 tablet Oral Daily  . [COMPLETED] potassium chloride  40 mEq Oral Once  . potassium chloride  40 mEq Oral BID  . simvastatin  40 mg Oral QHS  . sodium chloride  10-40 mL Intracatheter Q12H  . sodium chloride  3 mL Intravenous Q12H  . spironolactone  25 mg Oral Daily  . thiamine  100 mg Oral  Daily  . [DISCONTINUED] lactulose  30 g Oral TID  . [DISCONTINUED] potassium chloride  20 mEq Oral BID  . [DISCONTINUED] potassium chloride  20 mEq Oral Once  . [DISCONTINUED] spironolactone  12.5 mg Oral Daily   Infusions:    . furosemide (LASIX) infusion 15 mg/hr (09/05/12 0304)     Objective:  Blood pressure 135/72, pulse 85, temperature 97.4 F (36.3 C), temperature source Oral, resp. rate 18, height 5\' 9"  (1.753 m), weight 124.1 kg (273 lb 9.5 oz), SpO2 100.00%. Weight change: -4.173 kg (-9 lb 3.2 oz)  Physical Exam: General:  Chronically ill. No resp difficulty HEENT: normal Neck: supple. JVP difficult to see but appears elevated. Carotids 2+ bilat; no bruits. No lymphadenopathy or thryomegaly appreciated. Cor: PMI nondisplaced. Regular rate & rhythm. No rubs, gallops. 1/6 SEM RUSB. Lungs: Crackles at bases bilaterally Abdomen: obese soft, nontender, nondistended. No hepatosplenomegaly. No bruits or masses. Good bowel sounds. Extremities: no cyanosis, clubbing, rash, R and LLE 1-2+ edema   Neuro: alert & orientedx3, cranial nerves grossly intact. moves all 4 extremities w/o difficulty. Affect pleasant   Basic Metabolic Panel:  Lab 09/05/12 1610 09/04/12 0430 09/03/12 0605 09/02/12 0658 09/01/12 0515  NA 136 139 138 140 139  K 3.2*  3.1* -- -- --  CL 90* 91* 93* 97 96  CO2 39* 39* 36* 35* 34*  GLUCOSE 182* 162* 146* 153* 157*  BUN 59* 62* 65* 67* 70*  CREATININE 1.13 1.18 1.21 1.27 1.31  CALCIUM 9.4 9.5 9.3 9.2 9.2  MG -- -- -- -- --  PHOS -- -- -- -- --   Liver Function Tests:  Lab 09/02/12 0658 09/01/12 0515 08/31/12 0515 08/30/12 0430  AST 68* 97* 128* 191*  ALT 76* 88* 96* 112*  ALKPHOS 79 82 87 99  BILITOT 0.8 0.9 0.8 0.7  PROT 6.7 6.4 6.7 7.1  ALBUMIN 2.8* 2.8* 2.9* 3.1*   No results found for this basename: LIPASE:5,AMYLASE:5 in the last 168 hours  Lab 08/30/12 0430  AMMONIA 27   CBC:  Lab 09/05/12 0500 09/04/12 0430 09/03/12 0605 09/02/12 0658  09/01/12 0515 08/30/12 0430  WBC 5.6 6.7 6.8 5.2 6.0 --  NEUTROABS -- -- -- -- -- 5.3  HGB 8.4* 8.7* 9.2* 8.8* 8.5* --  HCT 29.4* 30.6* 32.2* 31.5* 30.9* --  MCV 88.6 87.7 89.7 91.3 88.8 --  PLT 132* 150 120* 148* 179 --   Cardiac Enzymes:  Lab 08/30/12 1804 08/30/12 1212 08/30/12 0416  CKTOTAL -- -- --  CKMB -- -- --  CKMBINDEX -- -- --  TROPONINI <0.30 <0.30 <0.30   BNP: No components found with this basename: POCBNP:5 CBG:  Lab 09/05/12 0602 09/04/12 2049 09/04/12 1639 09/04/12 1134 09/04/12 0552  GLUCAP 189* 198* 141* 156* 177*   Microbiology: Lab Results  Component Value Date   CULT NO GROWTH 10/05/2011   CULT NO GROWTH 5 DAYS 10/05/2011   CULT NO GROWTH 5 DAYS 10/05/2011   CULT NO GROWTH 5 DAYS 06/25/2010   CULT NO GROWTH 5 DAYS 06/25/2010   No results found for this basename: CULT:2,SDES:2 in the last 168 hours  Imaging: Dg Chest Port 1 View  09/03/2012  *RADIOLOGY REPORT*  Clinical Data: PICC line placement.  PORTABLE CHEST - 1 VIEW  Comparison: 08/31/2012  Findings: Right upper extremity PICC line tip is in the lower SVC. Lungs show improved aeration since the prior chest x-ray with suggestion of potentially mild residual interstitial edema.  The heart size is stable.  IMPRESSION: PICC line tip is in the lower SVC.  Improved pulmonary edema.   Original Report Authenticated By: Irish Lack, M.D.      ASSESSMENT:  1. A/C diastolic heart failure EF 55-60% RV moderately dilated/HK with RV-RA gradient 56 mmHg.  2. Right heart failure 3. A Fib 4. Hypothyroidism 5. Obesity 6. CAD 7. Hematoma 8. Acute renal failure (baseline creatinine 0.8-1.1): suspect cardiorenal - improving 9. Hypokalemia  PLAN/DISCUSSION:  On exam, volume status seems to be improving but weights and I/Os are inaccurate making volume management very difficult. Will move to stepdown to follow CVPs through PICC. Continue current management for now until we get a better understanding of his  I/Os and CVPs. Hopefully will be ready for CIR in a couple days.  Appreciate [facility] care and CIR's input.     LOS: 6 days   Truman Hayward 9:11 AM

## 2012-09-05 NOTE — PMR Pre-admission (Signed)
PMR Admission Coordinator Pre-Admission Assessment  Patient: [name] is an 66 y.o., male MRN: [id number] DOB: 09/04/1946 Height: 5\' 9"  (175.3 cm) Weight: 119.75 kg (264 lb)              Insurance Information PRIMARY: Medicare      Policy#: [id number]      Subscriber: self Employer: Retired Benefits:  Name: Armed forces training and education officer. Date: 11/11/08     Deduct: $1184      Out of Pocket Max: none      Life Max: unlimited CIR: 100%      SNF: 100 Days Outpatient: 80%     Co-Pay: 20% Home Health: 100%      Co-Pay: 0 DME: 80%     Co-Pay: 20% Providers: patient's choice  Emergency Contact Information Contact Information    Name Relation Home Work Mobile   Hardy,[name] Daughter   [phone]     Current Medical History  Patient Admitting Diagnosis: Deconditioning related to CHF and multiple medical   History of Present Illness: 66 y.o. right-handed male with history of COPD, atrial fibrillation but not a candidate for Coumadin secondary to history alcohol use and chronic diastolic CHF. Admitted 08/30/2012 with altered mental status and oxygen saturations 85% on room air. Findings of elevated creatinine 1.93. Cardiac enzymes were unremarkable. Chest x-ray completed showing moderate pulmonary vascular congestion suspect CHF. Echocardiogram with ejection fraction of 60% and moderate RV dilatation, moderate TR. Patient placed on intravenous Lasix with followup cardiology services monitored closely for volume overload.   11/29: Cellulitis bilateral lower extremities:  Patient is started on vancomycin IV per pharmacy today, monitor renal function, continue vancomycin at least for 48-72hrs depending on improvement in patient can be transitioned to oral doxycycline, and continue ciprofloxacin Pseudomonas UTI  Dysuria not improved with ceftriaxone and pyridium initially and was placed on zosyn. Culture growing Pseudomonas which is pansensitive. Transition from zosyn to ciprofloxacin on 09/07/2012. Antibiotics  since 09/03/2012, define 7-10 day course of antibiotics.  Multiple soft tissue masses back bilaterally / primarily along left flank hematoma and left leg (chronic) 11/29; New acute/subacute L1 transverse process fracture of CT of abd and pelvis  Discussed with Dr. Phoebe Perch, neurosurgery, on 09/07/2012 who reviewed the images and recommended conservative management. Patient does not need outpatient followup with neurosurgery.    Past Medical History  Past Medical History  Diagnosis Date  . CHRONIC OBSTRUCTIVE PULMONARY DISEASE 06/20/2009  . OBSTRUCTIVE SLEEP APNEA 06/20/2009  . CAROTID STENOSIS 06/20/2009    A. 08/2001 s/p L CEA;  B.   09/14/11 - Carotid U/S - 40-59% bilateral stenosis, left CEA patch angioplasty is patent  . DM 06/20/2009  . CAD 06/20/2009    A.  08/2000 - s/p CABG x 4 - LIMA-LAD, Left Radial-OM, VG-DIAG, VG-RCA;  B. Neg. MV  2010  . HYPERLIPIDEMIA 06/20/2009  . HYPERTENSION 06/20/2009  . Hypothyroidism   . Low back pain   . Asthma     as child  . Pneumonia   . Atrial fibrillation     Not felt to be coumadin candidate 2/2 ETOH use.  Marland Kitchen Hypothyroidism   . ETOH abuse   . History of tobacco abuse     remote - quit 1970  . Bilateral renal cysts   . Marijuana abuse   . Morbidly obese   . CAD (coronary artery disease)   . CHF (congestive heart failure)   . Falls frequently    Family History  family history includes Stroke in his father and  mother.  Prior Rehab/Hospitalizations: SNF Jan 2013-March 2003   Current Medications  Current facility-administered medications:0.9 %  sodium chloride infusion, 250 mL, Intravenous, PRN, Tarry Kos, MD;  acetaminophen (TYLENOL) tablet 650 mg, 650 mg, Oral, Q4H PRN, Tarry Kos, MD;  aspirin tablet 325 mg, 325 mg, Oral, Daily, Tarry Kos, MD, 325 mg at 09/08/12 0856;  carvedilol (COREG) tablet 6.25 mg, 6.25 mg, Oral, BID WC, Kathleene Hazel, MD, 6.25 mg at 09/08/12 0856 ciprofloxacin (CIPRO) tablet 500 mg, 500 mg, Oral, BID,  Cristal Ford, MD, 500 mg at 09/08/12 0856;  FLUoxetine (PROZAC) capsule 20 mg, 20 mg, Oral, Daily, Tarry Kos, MD, 20 mg at 09/08/12 1610;  folic acid (FOLVITE) tablet 1 mg, 1 mg, Oral, Daily, Roger A Arguello, PA-C, 1 mg at 09/08/12 0857;  gabapentin (NEURONTIN) capsule 300 mg, 300 mg, Oral, Daily, Renae Fickle, MD, 300 mg at 09/08/12 0857 heparin injection 5,000 Units, 5,000 Units, Subcutaneous, Q8H, Dolores Patty, MD, 5,000 Units at 09/08/12 0645;  insulin aspart (novoLOG) injection 0-9 Units, 0-9 Units, Subcutaneous, TID WC, Roger A Arguello, PA-C, 3 Units at 09/08/12 0858;  insulin glargine (LANTUS) injection 10 Units, 10 Units, Subcutaneous, QHS, Renae Fickle, MD, 10 Units at 09/07/12 2211 lactulose (CHRONULAC) 10 GM/15ML solution 30 g, 30 g, Oral, Daily, Renae Fickle, MD, 30 g at 09/08/12 0857;  levothyroxine (SYNTHROID, LEVOTHROID) tablet 150 mcg, 150 mcg, Oral, QAC breakfast, Tarry Kos, MD, 150 mcg at 09/08/12 0645;  multivitamin with minerals tablet 1 tablet, 1 tablet, Oral, Daily, Gery Pray, PA-C, 1 tablet at 09/08/12 0857;  ondansetron (ZOFRAN) injection 4 mg, 4 mg, Intravenous, Q6H PRN, Tarry Kos, MD oxyCODONE-acetaminophen (PERCOCET/ROXICET) 5-325 MG per tablet 1-2 tablet, 1-2 tablet, Oral, Q6H PRN, Renae Fickle, MD, 2 tablet at 09/08/12 0647;  polyethylene glycol (MIRALAX / GLYCOLAX) packet 17 g, 17 g, Oral, Daily PRN, Renae Fickle, MD;  potassium chloride SA (K-DUR,KLOR-CON) CR tablet 40 mEq, 40 mEq, Oral, BID, Hadassah Pais, PA, 40 mEq at 09/08/12 0856 simvastatin (ZOCOR) tablet 40 mg, 40 mg, Oral, QHS, Tarry Kos, MD, 40 mg at 09/07/12 2210;  sodium chloride 0.9 % injection 10-40 mL, 10-40 mL, Intracatheter, Q12H, Renae Fickle, MD, 10 mL at 09/07/12 2212;  sodium chloride 0.9 % injection 10-40 mL, 10-40 mL, Intracatheter, PRN, Renae Fickle, MD, 10 mL at 09/05/12 0523;  sodium chloride 0.9 % injection 3 mL, 3 mL, Intravenous, Q12H, Tarry Kos, MD,  3 mL at 09/07/12 1042 sodium chloride 0.9 % injection 3 mL, 3 mL, Intravenous, PRN, Tarry Kos, MD, 3 mL at 09/02/12 2212;  sodium phosphate (FLEET) 7-19 GM/118ML enema 1 enema, 1 enema, Rectal, Daily PRN, Renae Fickle, MD;  spironolactone (ALDACTONE) tablet 25 mg, 25 mg, Oral, Daily, Hadassah Pais, PA, 25 mg at 09/08/12 0857;  torsemide (DEMADEX) tablet 40 mg, 40 mg, Oral, Daily, Hadassah Pais, PA, 40 mg at 09/08/12 0857 vancomycin (VANCOCIN) 1,500 mg in sodium chloride 0.9 % 500 mL IVPB, 1,500 mg, Intravenous, Q24H, Cristal Ford, MD;  [address] vancomycin (VANCOCIN) 2,500 mg in sodium chloride 0.9 % 500 mL IVPB, 2,500 mg, Intravenous, Once, Cristal Ford, MD, 2,500 mg at 09/08/12 0855;  [DISCONTINUED] DOPamine (INTROPIN) 800 mg in dextrose 5 % 250 mL infusion, 3 mcg/kg/min, Intravenous, Titrated, Hadassah Pais, PA [DISCONTINUED] furosemide (LASIX) 250 mg in dextrose 5 % 250 mL infusion, 15 mg/hr, Intravenous, Continuous, Dolores Patty, MD, Last Rate: 15 mL/hr at 09/07/12 1037, 15 mg/hr at 09/07/12 1037;  [DISCONTINUED] potassium chloride  SA (K-DUR,KLOR-CON) CR tablet 40 mEq, 40 mEq, Oral, TID, Hadassah Pais, PA, 40 mEq at 09/07/12 1042  Patients Current Diet: Cardiac  Precautions / Restrictions Precautions Precautions: Fall Precaution Comments: oxygen Restrictions Weight Bearing Restrictions: No   Prior Activity Level Limited Community (1-2x/wk): 2 times per week Home Assistive Devices / Equipment Home Assistive Devices/Equipment: None Home Adaptive Equipment: Walker - rolling;Straight cane  Prior Functional Level Prior Function Level of Independence: Needs assistance Able to Take Stairs?: No  Current Functional Level Cognition  Arousal/Alertness: Awake/alert Overall Cognitive Status: Appears within functional limits for tasks assessed/performed Current Attention Level: Focused Memory:  (Decr recall of history and PLOF) Orientation Level: Oriented X4 Following  Commands: Follows one step commands inconsistently Safety/Judgement: Decreased awareness of safety precautions;Decreased awareness of need for assistance;Decreased safety judgement for tasks assessed Cognition - Other Comments: Confused throughout session; difficulty telling time on face clock, difficulty remember date, inconsistant report of PLOF/history    Extremity Assessment (includes Sensation/Coordination)  RUE ROM/Strength/Tone: WFL for tasks assessed RUE Sensation: WFL - Light Touch  RLE ROM/Strength/Tone: Deficits RLE ROM/Strength/Tone Deficits: Grossly 2+/5  RLE Sensation: Deficits RLE Sensation Deficits: decr sensation to light touch below knee bilat. able to detect sustained pressure LL, some sensory in foot.    ADLs       Mobility  Bed Mobility: Not assessed Rolling Left: 3: Mod assist;With rail Left Sidelying to Sit: 3: Mod assist;With rails;HOB elevated Supine to Sit: 3: Mod assist;With rails;HOB elevated Sitting - Scoot to Edge of Bed: 3: Mod assist;With rail Sit to Supine: 1: +2 Total assist Sit to Supine: Patient Percentage: 50%    Transfers  Transfers: Sit to Stand;Stand to Sit Sit to Stand: From chair/3-in-1;With armrests;1: +2 Total assist Sit to Stand: Patient Percentage: 60% Stand to Sit: To chair/3-in-1;With armrests;3: Mod assist Stand to Sit: Patient Percentage: 70% Stand Pivot Transfers: 1: +2 Total assist Stand Pivot Transfers: Patient Percentage: 60%    Ambulation / Gait / Stairs / Wheelchair Mobility  Ambulation/Gait Ambulation/Gait Assistance: 4: Min Environmental consultant (Feet): 30 Feet Assistive device: Rolling walker Ambulation/Gait Assistance Details: slow shuffle gait with cueing for sequence, RW use, posture and safety with turning Gait Pattern: Trunk flexed;Shuffle Gait velocity: decreased Stairs: No Wheelchair Mobility Wheelchair Mobility: No    Posture / Holiday representative Standing - Balance Support:  Bilateral upper extremity supported Static Standing - Level of Assistance: 5: Stand by assistance Static Standing - Comment/# of Minutes: 2    Special needs/care consideration BiPAP/CPAP -no CPM -no Hep Lock  for IV Vanc Dialysis -no         Life Vest- no Oxygen- yes Special Bed- no Trach Size- no Wound Vac  -no      Location n/a Skin  Cellulitis bilateral lower extremities, DM ulcer scabs on BLEs, (B)knee abrasions Bowel mgmt:Continent, LBM: Bladder mgmt: Condom cath/Pseudomonas UTI Diabetic mgmt: SSI    Previous Home Environment Living Arrangements: Children Lives With: Family;Daughter Available Help at Discharge: Family Type of Home: House Home Layout: One level Home Access: Stairs to enter Entrance Stairs-Rails: Can reach both Entrance Stairs-Number of Steps: 3 Bathroom Shower/Tub:  (not established due to lack of pt attention) Home Care Services: No  Discharge Living Setting Plans for Discharge Living Setting: Patient's home Type of Home at Discharge: House Discharge Home Layout: One level Discharge Home Access: Stairs to enter Entrance Stairs-Rails: Can reach both Entrance Stairs-Number of Steps: 4 Discharge Bathroom Shower/Tub: Tub/shower unit Discharge Bathroom Toilet: Standard Discharge  Bathroom Accessibility: Yes How Accessible: Accessible via walker Do you have any problems obtaining your medications?: No  Social/Family/Support Systems Patient Roles: Parent Contact Information: [phone] Anticipated Caregiver: Daughter: Merlyn Lot Anticipated Caregiver's Contact Information: [phone] Ability/Limitations of Caregiver: none Caregiver Availability: 24/7 Discharge Plan Discussed with Primary Caregiver: Yes Is Caregiver In Agreement with Plan?: Yes Does Caregiver/Family have Issues with Lodging/Transportation while Pt is in Rehab?: No  Goals/Additional Needs Patient/Family Goal for Rehab: PT: mod I-S, OT: mod I-minA Expected length of stay: 7-10  days Cultural Considerations: none Dietary Needs: Heart Helathy Equipment Needs: TBD Pt/Family Agrees to Admission and willing to participate: Yes Program Orientation Provided & Reviewed with Pt/Caregiver Including Roles  & Responsibilities: Yes   Decrease burden of Care through IP rehab admission: n/a  Possible need for SNF placement upon discharge: Not planned  Patient Condition: Please see physician update to information in consult dated 09/04/12.  Preadmission Screen Completed By:  Meryl Dare, 09/08/2012 11:28 AM ______________________________________________________________________   Discussed status with Dr. Caren Hazy on 09/08/12 at  11:25AM and received telephone approval for admission today.  Admission Coordinator:  Meryl Dare, time 11:25 AM Dorna Bloom 09/08/12

## 2012-09-05 NOTE — Progress Notes (Signed)
Follow-up visit with patient with his niece present. Pt transferred to 2922 today due to CVPs. Assured pt that we will continue to follow him to assess for readiness for CIR. Pt still in agreement with plan for CIR when medically stable. Please call for questions: [phone]

## 2012-09-05 NOTE — Progress Notes (Signed)
Gave report to nurse on 2900Transferred to pt. stable

## 2012-09-05 NOTE — Progress Notes (Signed)
Physical Therapy Treatment Patient Details Name: [name] MRN: [id number] DOB: [birth year] Today's Date: 09/05/2012 Time: 4098-1191 PT Time Calculation (min): 41 min  PT Assessment / Plan / Recommendation Comments on Treatment Session  [name] is making good progress with therapies. Very motivated.     Follow Up Recommendations  CIR     Does the patient have the potential to tolerate intense rehabilitation     Barriers to Discharge        Equipment Recommendations  None recommended by PT    Recommendations for Other Services    Frequency Min 3X/week   Plan Discharge plan remains appropriate;Frequency remains appropriate    Precautions / Restrictions         Mobility  Bed Mobility Bed Mobility: Sit to Supine Sit to Supine: 1: +2 Total assist Sit to Supine: Patient Percentage: 50% Details for Bed Mobility Assistance: assist to elevated legs to bed and slow descent of trunk; cues for breathing Transfers Transfers: Sit to Stand;Stand to Sit;Stand Pivot Transfers Sit to Stand: 1: +2 Total assist;From chair/3-in-1 Sit to Stand: Patient Percentage: 60% Stand to Sit: 1: +2 Total assist;To bed Stand to Sit: Patient Percentage: 70% Stand Pivot Transfers: 1: +2 Total assist Stand Pivot Transfers: Patient Percentage: 60% Details for Transfer Assistance: facilitation bilaterally to bring trunk anteriorly over BOS and power up; cues for breathing (pt tends to hold breath with activity); sequencing cues for SPT Ambulation/Gait Ambulation/Gait Assistance: Not tested (comment)    Exercises General Exercises - Upper Extremity Chair Push Up: AROM;Both;10 reps;Seated General Exercises - Lower Extremity Ankle Circles/Pumps: AROM;Both;Other reps (comment) (30 reps) Long Arc Quad: AROM;Both;Other reps (comment) (30 reps) Hip ABduction/ADduction: AROM;Both;20 reps;Seated Hip Flexion/Marching: AROM;Both;20 reps;Seated Heel Raises: AROM;Both;20 reps;Seated    PT Goals Acute  Rehab PT Goals PT Goal: Sit to Stand - Progress: Progressing toward goal PT Goal: Stand to Sit - Progress: Progressing toward goal PT Transfer Goal: Bed to Chair/Chair to Bed - Progress: Progressing toward goal  Visit Information  Last PT Received On: 09/05/12 Assistance Needed: +2    Subjective Data  Subjective: You won't be able to get me up by yourself.    Cognition  Overall Cognitive Status: Appears within functional limits for tasks assessed/performed Arousal/Alertness: Awake/alert Orientation Level: Appears intact for tasks assessed Behavior During Session: [facility] for tasks performed    Balance     End of Session PT - End of Session Equipment Utilized During Treatment: Gait belt Activity Tolerance: Patient tolerated treatment well Patient left: in bed;with nursing in room Nurse Communication: Mobility status   GP     [facility] HELEN 09/05/2012, 2:48 PM

## 2012-09-05 NOTE — Progress Notes (Signed)
Patient transferred to 2900 SDU in order to follow CVPs.  CVP measured personally at bedside 14-15.  Will continue lasix 15 mg/hr.  Follow CVPs and strict I/Os.    Robbi Garter, [facility] 3:37 PM

## 2012-09-06 DIAGNOSIS — R7989 Other specified abnormal findings of blood chemistry: Secondary | ICD-10-CM

## 2012-09-06 DIAGNOSIS — E876 Hypokalemia: Secondary | ICD-10-CM | POA: Diagnosis present

## 2012-09-06 LAB — URINE CULTURE: Colony Count: >=100000

## 2012-09-06 LAB — CARBOXYHEMOGLOBIN
O2 Saturation: 58.6 %
Total hemoglobin: 8.3 g/dL — ABNORMAL LOW (ref 13.5–18.0)

## 2012-09-06 LAB — BASIC METABOLIC PANEL
Calcium: 9.5 mg/dL (ref 8.4–10.5)
Creatinine, Ser: 1.17 mg/dL (ref 0.50–1.35)
GFR calc Af Amer: 72 mL/min — ABNORMAL LOW (ref >90)

## 2012-09-06 LAB — CBC
MCH: 26 pg (ref 26.0–34.0)
MCV: 89.2 fL (ref 78.0–100.0)
Platelets: 135 10*3/uL — ABNORMAL LOW (ref 150–400)
RDW: 19.5 % — ABNORMAL HIGH (ref 11.5–15.5)

## 2012-09-06 LAB — GLUCOSE, CAPILLARY: Glucose-Capillary: 201 mg/dL — ABNORMAL HIGH (ref 70–99)

## 2012-09-06 MED ORDER — POTASSIUM CHLORIDE CRYS ER 20 MEQ PO TBCR
40.0000 meq | EXTENDED_RELEASE_TABLET | Freq: Once | ORAL | Status: AC
  Administered 2012-09-06: 40 meq via ORAL

## 2012-09-06 MED ORDER — METOLAZONE 5 MG PO TABS
5.0000 mg | ORAL_TABLET | Freq: Two times a day (BID) | ORAL | Status: DC
  Administered 2012-09-06 – 2012-09-07 (×3): 5 mg via ORAL
  Filled 2012-09-06 (×4): qty 1

## 2012-09-06 MED ORDER — POTASSIUM CHLORIDE CRYS ER 20 MEQ PO TBCR
40.0000 meq | EXTENDED_RELEASE_TABLET | Freq: Three times a day (TID) | ORAL | Status: DC
  Administered 2012-09-06 – 2012-09-07 (×4): 40 meq via ORAL
  Filled 2012-09-06 (×5): qty 2

## 2012-09-06 NOTE — Progress Notes (Addendum)
[facility] PROGRESS NOTE  [name] WJX:[id number] DOB: 1945-11-24 DOA: 08/30/2012 PCP: [name], MD  66 yo male with history of chf, copd, etoh abuse, ckd, cafib, freq falls with recent large hematoma to flank area presented to [facility] with acute on chronic heart failure with cardiac hepatitis and cardiorenal syndrome and trop of 0.78.  He was transferred for further management.  He has been diuresed with lasix gtt and metolazone but has been transferred to stepdown for UF by Dr. [name].  His transaminitis and AKI are resolving.  Patient's AMS was likely medication-related in setting of AKI and sedating medications were minimized.  He had asterixis on exam which improved with lactulose.  Mentation markedly improved.  Found to have pseudomonas UTI on 09/05/2012.  Hematomas on back and left leg pain are chronic.   Assessment/Plan: Diastolic heart failure, acute on chronic exacerbation, also severe pulmonary hypertension Initial weight on admission was 126.1 kg (278 lbs), weight today was 121.56 kg (268 lbs). I/O negative -14.2 L. Initial troponin elevation likely due to heart strain as was the case in 09/2011.  ECHO 11/20 demonstrated normal systolic function and EF of 55-60% with moderately dilated left atrium.  The PA pressure was severely elevated with moderate TR and moderately dilated RA.  Continue Lasix gtt and metolazone started 11/22.  Neg 3.2 L yesterday. Continue to monitor. Appreciate cardiology input. Not started on UF yet.  Pseudomonas UTI Dysuria not improved with ceftriaxone and pyridium and culture growing Pseudomonas.  BP low normal. Continue zosyn. Antibiotics since 09/03/2012. Culture sensitives pending, narrow when sensitivities return. Foley catheter discontinued.  AMS/Acute delirium, resolving Likely medication side effect, but patient also had asterixis despite normal ammonia level.  TSH was mildly elevated, check Free T4 tomorrow, but question patient  compliance and continued previous dose of synthroid.  Patient's asterixis has resolved with lactulose.  Vitamin B12 606.  Vitamin B1 24, normal.  RPR NR. Continue lactulose  CAD s/p 4V CABG 2001, neg myoview 2010 Mild elevation in initial troponin, now normalized, likely due to acute on chronic CHF.  Continue asa, statin and BB  Atrial fibrillation, rate controlled CHADS2 of 3, but not on warfarin due to alcohol use. Continue ASA and Carvedilol  Cardiorenal syndrome Baseline creatinine 0.9, initially 1.9 and trending down with diuresis.  FEurea 20%.   Hypokalemia  KCL PO TID.   Transaminitis AST: ALT almost 2:1.  May have been due to alcohol use yesterday or heart failure:  Improved with diuresis.  Will need repeat as outpatient.    DM Start lantus 10 units. Hemoglobin A1C 5.8 on 08/30/2012. Resume glipizide at discharge. Continue SSI  Anemia, normocytic, chronic, but hemoglobin slightly lower than baseline. Patient had recent fall with palpable hematoma on left flank with extensive bruising.  Likely due to chronic alcohol use.  Hgb stable around 8.8.  Send anemia panel in the morning.  Thrombocytopenia, mild. Likely due to acute illness and alcohol use.   Hypothyroidism TSH:  10.1. Check Free T4 tomorrow. Continue synthroid at current dose - question compliance with medications.  Pain, primarily along left flank hematoma and left leg (chronic) Percocet as needed  Likely lipoma on back Defer to PCP for further workup.  DIET:  Healthy heart ACCESS:  PICC IVF:  None Prophylaxis:  SQ Heparin   Code Status: Full code Family Communication: spoke with patient Disposition Plan: Pending diuresis, kidney and liver function improving.  CIR pending. Discussed with Dr. [name], plan on possible discharge on 09/08/2012 (  Friday) to CIR.    Consultants:  Cardiology  Procedures:  PICC 11/24  Antibiotics:  Ceftriaxone 11/24 >> 11/26  Zosyn 11/26  >>  HPI/Subjective: No specific concerns. Complained of chronic knot in the back.    Objective: Filed Vitals:   09/06/12 0327 09/06/12 0328 09/06/12 0600 09/06/12 0800  BP: 123/58   115/54  Pulse:    82  Temp:  98.1 F (36.7 C)  97.9 F (36.6 C)  TempSrc:  Oral  Oral  Resp:    19  Height:      Weight:   121.564 kg (268 lb)   SpO2:  97%  95%    Intake/Output Summary (Last 24 hours) at 09/06/12 0912 Last data filed at 09/06/12 0844  Gross per 24 hour  Intake   1210 ml  Output   4325 ml  Net  -3115 ml   Filed Weights   09/05/12 0605 09/05/12 0904 09/06/12 0600  Weight: 123.016 kg (271 lb 3.2 oz) 124.1 kg (273 lb 9.5 oz) 121.564 kg (268 lb)    Exam: Physical Exam: General: Awake, Oriented, No acute distress. HEENT: EOMI, JVD elevated. Neck: Irregularly irregular. CV: S1 and S2 Lungs: Clear to ascultation bilaterally Abdomen: Soft, Nontender, Nondistended, +bowel sounds. Ext: Good pulses. 1+ LE edema. Back: Fist sized free moving lesion on mid back (likely lipoma).  Data Reviewed: Basic Metabolic Panel:  Lab 09/06/12 1610 09/05/12 0500 09/04/12 0430 09/03/12 0605 09/02/12 0658  NA 136 136 139 138 140  K 3.1* 3.2* 3.1* 3.5 3.8  CL 88* 90* 91* 93* 97  CO2 39* 39* 39* 36* 35*  GLUCOSE 189* 182* 162* 146* 153*  BUN 57* 59* 62* 65* 67*  CREATININE 1.17 1.13 1.18 1.21 1.27  CALCIUM 9.5 9.4 9.5 9.3 9.2  MG -- -- -- -- --  PHOS -- -- -- -- --   Liver Function Tests:  Lab 09/02/12 0658 09/01/12 0515 08/31/12 0515  AST 68* 97* 128*  ALT 76* 88* 96*  ALKPHOS 79 82 87  BILITOT 0.8 0.9 0.8  PROT 6.7 6.4 6.7  ALBUMIN 2.8* 2.8* 2.9*   No results found for this basename: LIPASE:5,AMYLASE:5 in the last 168 hours No results found for this basename: AMMONIA:5 in the last 168 hours CBC:  Lab 09/06/12 0500 09/05/12 0500 09/04/12 0430 09/03/12 0605 09/02/12 0658  WBC 6.4 5.6 6.7 6.8 5.2  NEUTROABS -- -- -- -- --  HGB 8.7* 8.4* 8.7* 9.2* 8.8*  HCT 29.8* 29.4* 30.6*  32.2* 31.5*  MCV 89.2 88.6 87.7 89.7 91.3  PLT 135* 132* 150 120* 148*   Cardiac Enzymes:  Lab 08/30/12 1804 08/30/12 1212  CKTOTAL -- --  CKMB -- --  CKMBINDEX -- --  TROPONINI <0.30 <0.30   BNP (last 3 results)  Basename 08/30/12 0416 10/13/11 0552 10/07/11 0407  PROBNP 18070.0* 5170.0* 14242.0*   CBG:  Lab 09/06/12 0805 09/05/12 2127 09/05/12 1620 09/05/12 1117 09/05/12 0602  GLUCAP 145* 173* 183* 200* 189*    Recent Results (from the past 240 hour(s))  URINE CULTURE     Status: Normal (Preliminary result)   Collection Time   09/04/12  1:15 PM      Component Value Range Status Comment   Specimen Description URINE, CLEAN CATCH   Final    Special Requests NONE   Final    Culture  Setup Time 09/04/2012 13:37   Final    Colony Count >=100,000 COLONIES/ML   Final    Culture PSEUDOMONAS AERUGINOSA  Final    Report Status PENDING   Incomplete   MRSA PCR SCREENING     Status: Normal   Collection Time   09/05/12  3:29 PM      Component Value Range Status Comment   MRSA by PCR NEGATIVE  NEGATIVE Final      Studies: No results found.  Scheduled Meds:    . acetaZOLAMIDE  250 mg Oral BID  . aspirin  325 mg Oral Daily  . carvedilol  6.25 mg Oral BID WC  . FLUoxetine  20 mg Oral Daily  . folic acid  1 mg Oral Daily  . gabapentin  300 mg Oral Daily  . heparin subcutaneous  5,000 Units Subcutaneous Q8H  . insulin aspart  0-9 Units Subcutaneous TID WC  . insulin glargine  10 Units Subcutaneous QHS  . lactulose  30 g Oral Daily  . levothyroxine  150 mcg Oral QAC breakfast  . metolazone  2.5 mg Oral Daily  . multivitamin with minerals  1 tablet Oral Daily  . piperacillin-tazobactam (ZOSYN)  IV  3.375 g Intravenous Q8H  . potassium chloride  40 mEq Oral TID  . [COMPLETED] potassium chloride  40 mEq Oral Once  . simvastatin  40 mg Oral QHS  . sodium chloride  10-40 mL Intracatheter Q12H  . sodium chloride  3 mL Intravenous Q12H  . spironolactone  25 mg Oral Daily  .  [DISCONTINUED] cefTRIAXone (ROCEPHIN)  IV  1 g Intravenous Q24H  . [DISCONTINUED] potassium chloride  40 mEq Oral BID  . [DISCONTINUED] thiamine  100 mg Oral Daily   Continuous Infusions:    . furosemide (LASIX) infusion 15 mg/hr (09/06/12 0700)    Principal Problem:  *Acute on chronic diastolic CHF (congestive heart failure) Active Problems:  DM  HYPERLIPIDEMIA  HYPERTENSION  CHRONIC OBSTRUCTIVE PULMONARY DISEASE  CAD  ETOH abuse  Atrial fibrillation  Hematoma-on back, sacral area, right chest wall post fall  Acute renal failure  Weakness generalized  Hypothyroidism  Acute respiratory failure with hypoxia  Elevated troponin  Elevated LFTs  Pseudomonas urinary tract infection   Kojo Liby A  [facility] Pager [phone]. If 8PM-8AM, please contact night-coverage at [email or web address], password [facility] 09/06/2012, 9:12 AM  LOS: 7 days

## 2012-09-06 NOTE — Progress Notes (Signed)
Advanced Heart Failure Rounding Note  Subjective:  [name] is a 66 yo male with PMX s/f CAD (s/p CABG x 4 in 2001, normal Myoview 2010), chronic diastolic CHF, persistent a-fib, DM, HTN, HL, carotid artery disease, COPD, hypothyroidism, EtOH abuse, chronic venous insufficiency and morbid obesity.  He is not anticoagulants due to ETOH abuse and fall risk.   Echo 09/2011: LVEF 45-50%, mod biatral enlargement, moderate RV dilatation, moderate TR, PASP 66 mmHg.  ECHO 08/2012 EF 55-60% moderate bilateral RA/LA enlargement  Admitted 08/30/12 for acute on chronic diastolic CHF. He has been diuresed with IV lasix 40 mg bid.   Admit weight 278 but trending up to 282 pounds. After reviewing office note weights from the last 6 months his weight was 240-250 pounds.   Transferred to SDU to measure CVPs yesterday.  CVP 13-14.  Foley removed due to burning, no further burning.  Cr. Stable.  Denies dyspnea orthopnea, PND.  No chest pain.  Diamox started 11/25 CO2 39.     Intake/Output Summary (Last 24 hours) at 09/06/12 0916 Last data filed at 09/06/12 0844  Gross per 24 hour  Intake   1210 ml  Output   4325 ml  Net  -3115 ml    Current meds:    . acetaZOLAMIDE  250 mg Oral BID  . aspirin  325 mg Oral Daily  . carvedilol  6.25 mg Oral BID WC  . FLUoxetine  20 mg Oral Daily  . folic acid  1 mg Oral Daily  . gabapentin  300 mg Oral Daily  . heparin subcutaneous  5,000 Units Subcutaneous Q8H  . insulin aspart  0-9 Units Subcutaneous TID WC  . insulin glargine  10 Units Subcutaneous QHS  . lactulose  30 g Oral Daily  . levothyroxine  150 mcg Oral QAC breakfast  . metolazone  2.5 mg Oral Daily  . multivitamin with minerals  1 tablet Oral Daily  . piperacillin-tazobactam (ZOSYN)  IV  3.375 g Intravenous Q8H  . potassium chloride  40 mEq Oral TID  . [COMPLETED] potassium chloride  40 mEq Oral Once  . simvastatin  40 mg Oral QHS  . sodium chloride  10-40 mL Intracatheter Q12H  . sodium chloride   3 mL Intravenous Q12H  . spironolactone  25 mg Oral Daily  . [DISCONTINUED] cefTRIAXone (ROCEPHIN)  IV  1 g Intravenous Q24H  . [DISCONTINUED] potassium chloride  40 mEq Oral BID  . [DISCONTINUED] thiamine  100 mg Oral Daily   Infusions:    . furosemide (LASIX) infusion 15 mg/hr (09/06/12 0700)     Objective:  Blood pressure 115/54, pulse 82, temperature 97.9 F (36.6 C), temperature source Oral, resp. rate 19, height 5\' 9"  (1.753 m), weight 121.564 kg (268 lb), SpO2 95.00%. Weight change: 1.084 kg (2 lb 6.3 oz)  Physical Exam: General:  Chronically ill. No resp difficulty HEENT: normal Neck: supple. JVP difficult to see but appears elevated. Carotids 2+ bilat; no bruits. No lymphadenopathy or thryomegaly appreciated. Cor: PMI nondisplaced. Regular rate & rhythm. No rubs, gallops. 1/6 SEM RUSB. Lungs: Crackles at bases bilaterally Abdomen: obese soft, nontender, nondistended. No hepatosplenomegaly. No bruits or masses. Good bowel sounds. Extremities: no cyanosis, clubbing, rash, R and LLE 1-2+ edema   Neuro: alert & orientedx3, cranial nerves grossly intact. moves all 4 extremities w/o difficulty. Affect pleasant   Basic Metabolic Panel:  Lab 09/06/12 8657 09/05/12 0500 09/04/12 0430 09/03/12 0605 09/02/12 0658  NA 136 136 139 138 140  K  3.1* 3.2* -- -- --  CL 88* 90* 91* 93* 97  CO2 39* 39* 39* 36* 35*  GLUCOSE 189* 182* 162* 146* 153*  BUN 57* 59* 62* 65* 67*  CREATININE 1.17 1.13 1.18 1.21 1.27  CALCIUM 9.5 9.4 9.5 9.3 9.2  MG -- -- -- -- --  PHOS -- -- -- -- --   Liver Function Tests:  Lab 09/02/12 0658 09/01/12 0515 08/31/12 0515  AST 68* 97* 128*  ALT 76* 88* 96*  ALKPHOS 79 82 87  BILITOT 0.8 0.9 0.8  PROT 6.7 6.4 6.7  ALBUMIN 2.8* 2.8* 2.9*   No results found for this basename: LIPASE:5,AMYLASE:5 in the last 168 hours No results found for this basename: AMMONIA:5 in the last 168 hours CBC:  Lab 09/06/12 0500 09/05/12 0500 09/04/12 0430 09/03/12 0605  09/02/12 0658  WBC 6.4 5.6 6.7 6.8 5.2  NEUTROABS -- -- -- -- --  HGB 8.7* 8.4* 8.7* 9.2* 8.8*  HCT 29.8* 29.4* 30.6* 32.2* 31.5*  MCV 89.2 88.6 87.7 89.7 91.3  PLT 135* 132* 150 120* 148*   Cardiac Enzymes:  Lab 08/30/12 1804 08/30/12 1212  CKTOTAL -- --  CKMB -- --  CKMBINDEX -- --  TROPONINI <0.30 <0.30   BNP: No components found with this basename: POCBNP:5 CBG:  Lab 09/06/12 0805 09/05/12 2127 09/05/12 1620 09/05/12 1117 09/05/12 0602  GLUCAP 145* 173* 183* 200* 189*   Microbiology: Lab Results  Component Value Date   CULT PSEUDOMONAS AERUGINOSA 09/04/2012   CULT NO GROWTH 10/05/2011   CULT NO GROWTH 5 DAYS 10/05/2011   CULT NO GROWTH 5 DAYS 10/05/2011   CULT NO GROWTH 5 DAYS 06/25/2010    Lab 09/04/12 1315  CULT PSEUDOMONAS AERUGINOSA  SDES URINE, CLEAN CATCH    Imaging: No results found.   ASSESSMENT:   1. A/C diastolic heart failure EF 55-60% RV moderately dilated/HK with RV-RA gradient 56 mmHg.  2. Right heart failure 3. A Fib 4. Hypothyroidism 5. Obesity 6. CAD 7. Hematoma 8. Acute renal failure (baseline creatinine 0.8-1.1): suspect cardiorenal - improving 9. Hypokalemia  PLAN/DISCUSSION:  Volume status improving with lasix gtt.  Weight remains elevated at 268 pounds and CVP 12-13.  Will continue with current therapies.  Supp K+.  Can increase metolazone 5 mg bid.    Rx of pseudomonas UTI per primary team.  Hopefully we can get to CIR on Friday if ready.   Appreciate [facility] and CIR's input.     LOS: 7 days

## 2012-09-07 ENCOUNTER — Inpatient Hospital Stay (HOSPITAL_COMMUNITY): Payer: Medicare Other

## 2012-09-07 ENCOUNTER — Encounter (HOSPITAL_COMMUNITY): Payer: Self-pay | Admitting: *Deleted

## 2012-09-07 DIAGNOSIS — S32019A Unspecified fracture of first lumbar vertebra, initial encounter for closed fracture: Secondary | ICD-10-CM | POA: Diagnosis present

## 2012-09-07 LAB — BASIC METABOLIC PANEL
BUN: 57 mg/dL — ABNORMAL HIGH (ref 6–23)
CO2: 39 mEq/L — ABNORMAL HIGH (ref 19–32)
Calcium: 9.4 mg/dL (ref 8.4–10.5)
Creatinine, Ser: 1.4 mg/dL — ABNORMAL HIGH (ref 0.50–1.35)
Glucose, Bld: 154 mg/dL — ABNORMAL HIGH (ref 70–99)
Sodium: 137 mEq/L (ref 135–145)

## 2012-09-07 LAB — T4, FREE: Free T4: 0.91 ng/dL (ref 0.80–1.80)

## 2012-09-07 LAB — CBC
HCT: 28 % — ABNORMAL LOW (ref 39.0–52.0)
Hemoglobin: 8.1 g/dL — ABNORMAL LOW (ref 13.0–17.0)
MCH: 25.3 pg — ABNORMAL LOW (ref 26.0–34.0)
MCV: 87.5 fL (ref 78.0–100.0)
RBC: 3.2 MIL/uL — ABNORMAL LOW (ref 4.22–5.81)

## 2012-09-07 LAB — RETICULOCYTES: Retic Ct Pct: 3.7 % — ABNORMAL HIGH (ref 0.4–3.1)

## 2012-09-07 LAB — VITAMIN B12: Vitamin B-12: 548 pg/mL (ref 211–911)

## 2012-09-07 LAB — GLUCOSE, CAPILLARY
Glucose-Capillary: 264 mg/dL — ABNORMAL HIGH (ref 70–99)
Glucose-Capillary: 217 mg/dL — ABNORMAL HIGH (ref 70–99)

## 2012-09-07 LAB — MAGNESIUM: Magnesium: 1.5 mg/dL (ref 1.5–2.5)

## 2012-09-07 LAB — CARBOXYHEMOGLOBIN: O2 Saturation: 79.8 %

## 2012-09-07 LAB — IRON AND TIBC: TIBC: 353 ug/dL (ref 215–435)

## 2012-09-07 MED ORDER — DOPAMINE-DEXTROSE 3.2-5 MG/ML-% IV SOLN: 3.0000 ug/kg/min | INTRAVENOUS | Status: DC

## 2012-09-07 MED ORDER — FOLIC ACID 1 MG PO TABS: 1.0000 mg | ORAL_TABLET | Freq: Every day | ORAL | Status: DC

## 2012-09-07 MED ORDER — LACTULOSE 10 GM/15ML PO SOLN: 30.0000 g | Freq: Every day | ORAL | Status: DC

## 2012-09-07 MED ORDER — POTASSIUM CHLORIDE CRYS ER 20 MEQ PO TBCR: 40.0000 meq | EXTENDED_RELEASE_TABLET | Freq: Two times a day (BID) | ORAL | Status: DC

## 2012-09-07 MED ORDER — POLYETHYLENE GLYCOL 3350 17 G PO PACK: 17.0000 g | PACK | Freq: Every day | ORAL | Status: DC | PRN

## 2012-09-07 MED ORDER — OXYCODONE-ACETAMINOPHEN 5-325 MG PO TABS: 1.0000 | ORAL_TABLET | Freq: Four times a day (QID) | ORAL | Status: DC | PRN

## 2012-09-07 MED ORDER — CIPROFLOXACIN HCL 500 MG PO TABS
500.0000 mg | ORAL_TABLET | Freq: Two times a day (BID) | ORAL | Status: DC
  Administered 2012-09-07 – 2012-09-08 (×3): 500 mg via ORAL
  Filled 2012-09-07 (×5): qty 1

## 2012-09-07 MED ORDER — TORSEMIDE 20 MG PO TABS
40.0000 mg | ORAL_TABLET | Freq: Every day | ORAL | Status: DC
  Administered 2012-09-08: 40 mg via ORAL
  Filled 2012-09-07: qty 2

## 2012-09-07 MED ORDER — POTASSIUM CHLORIDE CRYS ER 20 MEQ PO TBCR
40.0000 meq | EXTENDED_RELEASE_TABLET | Freq: Two times a day (BID) | ORAL | Status: DC
  Administered 2012-09-07 – 2012-09-08 (×2): 40 meq via ORAL
  Filled 2012-09-07 (×2): qty 2

## 2012-09-07 MED ORDER — CIPROFLOXACIN HCL 500 MG PO TABS: 500.0000 mg | ORAL_TABLET | Freq: Two times a day (BID) | ORAL | Status: AC

## 2012-09-07 MED ORDER — SPIRONOLACTONE 25 MG PO TABS: 25.0000 mg | ORAL_TABLET | Freq: Every day | ORAL | Status: DC

## 2012-09-07 MED ORDER — ACETAMINOPHEN 325 MG PO TABS: 650.0000 mg | ORAL_TABLET | ORAL | Status: DC | PRN

## 2012-09-07 MED ORDER — TORSEMIDE 20 MG PO TABS: 40.0000 mg | ORAL_TABLET | Freq: Every day | ORAL | Status: DC

## 2012-09-07 MED ORDER — CARVEDILOL 6.25 MG PO TABS: 6.2500 mg | ORAL_TABLET | Freq: Two times a day (BID) | ORAL | Status: DC

## 2012-09-07 MED ORDER — GABAPENTIN 300 MG PO CAPS: 300.0000 mg | ORAL_CAPSULE | Freq: Every day | ORAL | Status: DC

## 2012-09-07 NOTE — Progress Notes (Signed)
Advanced Heart Failure Rounding Note  Subjective:  [name] is a 66 yo male with PMX s/f CAD (s/p CABG x 4 in 2001, normal Myoview 2010), chronic diastolic CHF, persistent a-fib, DM, HTN, HL, carotid artery disease, COPD, hypothyroidism, EtOH abuse, chronic venous insufficiency and morbid obesity.  He is not anticoagulants due to ETOH abuse and fall risk.   Echo 09/2011: LVEF 45-50%, mod biatral enlargement, moderate RV dilatation, moderate TR, PASP 66 mmHg.  ECHO 08/2012 EF 55-60% moderate bilateral RA/LA enlargement  Admitted 08/30/12 for acute on chronic diastolic CHF. He has been diuresed with IV lasix 40 mg bid.   Admit weight 278 but trending up to 282 pounds. After reviewing office note weights from the last 6 months his weight was 240-250 pounds.   CVP 8. Cr up 1.17->1.4.  Feels pretty good.  Denies dyspnea orthopnea, PND.  No chest pain.       Intake/Output Summary (Last 24 hours) at 09/07/12 1101 Last data filed at 09/07/12 0700  Gross per 24 hour  Intake   1410 ml  Output   2500 ml  Net  -1090 ml    Current meds:    . [COMPLETED] acetaZOLAMIDE  250 mg Oral BID  . aspirin  325 mg Oral Daily  . carvedilol  6.25 mg Oral BID WC  . ciprofloxacin  500 mg Oral BID  . FLUoxetine  20 mg Oral Daily  . folic acid  1 mg Oral Daily  . gabapentin  300 mg Oral Daily  . heparin subcutaneous  5,000 Units Subcutaneous Q8H  . insulin aspart  0-9 Units Subcutaneous TID WC  . insulin glargine  10 Units Subcutaneous QHS  . lactulose  30 g Oral Daily  . levothyroxine  150 mcg Oral QAC breakfast  . metolazone  5 mg Oral BID  . multivitamin with minerals  1 tablet Oral Daily  . potassium chloride  40 mEq Oral TID  . simvastatin  40 mg Oral QHS  . sodium chloride  10-40 mL Intracatheter Q12H  . sodium chloride  3 mL Intravenous Q12H  . spironolactone  25 mg Oral Daily  . [DISCONTINUED] piperacillin-tazobactam (ZOSYN)  IV  3.375 g Intravenous Q8H   Infusions:    . furosemide (LASIX)  infusion 15 mg/hr (09/07/12 1037)     Objective:  Blood pressure 109/59, pulse 75, temperature 97.4 F (36.3 C), temperature source Oral, resp. rate 18, height 5\' 9"  (1.753 m), weight 119.75 kg (264 lb), SpO2 89.00%. Weight change: -4.35 kg (-9 lb 9.5 oz)  Physical Exam: General:  Chronically ill. No resp difficulty HEENT: normal Neck: supple. JVP difficult to see Carotids 2+ bilat; no bruits. No lymphadenopathy or thryomegaly appreciated. Cor: PMI nondisplaced. Regular rate & rhythm. No rubs, gallops. 1/6 SEM RUSB. Lungs: Crackles at bases bilaterally Abdomen: obese soft, nontender, nondistended. No hepatosplenomegaly. No bruits or masses. Good bowel sounds. Large irregular subQ masses on back Extremities: no cyanosis, clubbing, rash, R and LLE tr edema   Neuro: alert & orientedx3, cranial nerves grossly intact. moves all 4 extremities w/o difficulty. Affect pleasant   Basic Metabolic Panel:  Lab 09/07/12 1610 09/06/12 0500 09/05/12 0500 09/04/12 0430 09/03/12 0605  NA 137 136 136 139 138  K 3.5 3.1* -- -- --  CL 91* 88* 90* 91* 93*  CO2 39* 39* 39* 39* 36*  GLUCOSE 154* 189* 182* 162* 146*  BUN 57* 57* 59* 62* 65*  CREATININE 1.40* 1.17 1.13 1.18 1.21  CALCIUM 9.4 9.5 9.4  9.5 9.3  MG 1.5 -- -- -- --  PHOS -- -- -- -- --   Liver Function Tests:  Lab 09/02/12 0658 09/01/12 0515  AST 68* 97*  ALT 76* 88*  ALKPHOS 79 82  BILITOT 0.8 0.9  PROT 6.7 6.4  ALBUMIN 2.8* 2.8*   No results found for this basename: LIPASE:5,AMYLASE:5 in the last 168 hours No results found for this basename: AMMONIA:5 in the last 168 hours CBC:  Lab 09/07/12 0500 09/06/12 0500 09/05/12 0500 09/04/12 0430 09/03/12 0605  WBC 6.3 6.4 5.6 6.7 6.8  NEUTROABS -- -- -- -- --  HGB 8.1* 8.7* 8.4* 8.7* 9.2*  HCT 28.0* 29.8* 29.4* 30.6* 32.2*  MCV 87.5 89.2 88.6 87.7 89.7  PLT 128* 135* 132* 150 120*   Cardiac Enzymes: No results found for this basename: CKTOTAL:5,CKMB:5,CKMBINDEX:5,TROPONINI:5 in  the last 168 hours BNP: No components found with this basename: POCBNP:5 CBG:  Lab 09/07/12 0743 09/06/12 2201 09/06/12 1641 09/06/12 1142 09/06/12 0805  GLUCAP 160* 201* 156* 167* 145*   Microbiology: Lab Results  Component Value Date   CULT PSEUDOMONAS AERUGINOSA 09/04/2012   CULT NO GROWTH 10/05/2011   CULT NO GROWTH 5 DAYS 10/05/2011   CULT NO GROWTH 5 DAYS 10/05/2011   CULT NO GROWTH 5 DAYS 06/25/2010    Lab 09/04/12 1315  CULT PSEUDOMONAS AERUGINOSA  SDES URINE, CLEAN CATCH    Imaging: No results found.   ASSESSMENT:   1. A/C diastolic heart failure EF 55-60% RV moderately dilated/HK with RV-RA gradient 56 mmHg.  2. Right heart failure 3. A Fib 4. Hypothyroidism 5. Obesity 6. CAD 7. Hematoma 8. Acute renal failure (baseline creatinine 0.8-1.1): suspect cardiorenal  9. Hypokalemia 10. SubQ masses on lower back  PLAN/DISCUSSION:  Patient seen and examined with Ulyess Blossom, PA-C. We discussed all aspects of the encounter. I agree with the assessment and plan as stated above.   Volume status looks much better. CVP now 8. Cr slightly worse. Will stop IV lasix and metolazone. Switch to po demadex. Likely can go to CIR tomorrow if he is a candidate.  SubQ masses on back do not appear to be simple lipomas. We will get CT ab and pelvis to further evaluate.   D/w Dr. Betti Cruz of [facility]. Appreciate their care.  Maddux First,MD 11:50 AM

## 2012-09-07 NOTE — Progress Notes (Addendum)
[facility] PROGRESS NOTE  [name]:[id number] DOB: [birth year] DOA: 08/30/2012 PCP: [name], MD  66 yo male with history of chf, copd, etoh abuse, ckd, cafib, freq falls with recent large hematoma to flank area presented to [facility] with acute on chronic heart failure with cardiac hepatitis and cardiorenal syndrome and trop of 0.78.  He was transferred for further management.  He has been diuresed with lasix gtt and metolazone but has been transferred to stepdown for UF by Dr. [name], so far the patient has not required ultrafiltration and has been diuresis to with Lasix drip.  His transaminitis and AKI are resolving.  Patient's AMS was likely medication-related in setting of AKI and sedating medications were minimized.  He had asterixis on exam which improved with lactulose.  Mentation markedly improved.  Found to have pseudomonas UTI on 09/05/2012.  Hematomas on back and left leg pain are chronic.   Assessment/Plan: Diastolic heart failure, acute on chronic exacerbation, also severe pulmonary hypertension Initial weight on admission was 126.1 kg (278 lbs), weight today was 119.75 kg (264 lbs). I/O negative -15.3 L to date. Initial troponin elevation likely due to heart strain as was the case in 09/2011.  ECHO 11/20 demonstrated normal systolic function and EF of 55-60% with moderately dilated left atrium.  The PA pressure was severely elevated with moderate TR and moderately dilated RA.  Given mild bump in creatinine, Lasix gtt and metolazone discontinued (11/22-11/28), has not been started or needed UF yet. Cardiology transitioned the patient to oral torsemide. Current regimen includes torsemide 40 mg daily, spironolactone 25 mg daily, carvedilol 6.25 mg bid.  Pseudomonas UTI Dysuria not improved with ceftriaxone and pyridium initially and was placed on zosyn. Culture growing Pseudomonas which is pansensitive. Transition from zosyn to ciprofloxacin on 09/07/2012.  Antibiotics since 09/03/2012, define 7-10 day course of antibiotics. Discontinue foley catheter tomorrow.  AMS/Acute delirium, resolving Likely medication side effect, but patient also had asterixis despite normal ammonia level.  TSH was mildly elevated, check Free T4 normal at 0.91, some question about patient compliance, continued previous dose of synthroid.  Patient's asterixis has resolved with lactulose.  Vitamin B12 606.  Vitamin B1 24, normal.  RPR normal. Continue lactulose  CAD s/p 4V CABG 2001, neg myoview 2010 Mild elevation in initial troponin, now normalized, likely due to acute on chronic CHF.  Continue asa, statin and BB  Atrial fibrillation, rate controlled CHADS2 of 3, but not on warfarin due to alcohol use. Continue ASA and Carvedilol. Defer to cardiology for consideration of starting anticoagulation as outpatient.  Cardiorenal syndrome Baseline creatinine 0.9, initially 1.9 and trending down with diuresis.  Lasix and metolazone discontinued due to mild bump in creatinine.  Hypokalemia  KCL PO BID.  Resolved with replacement.  Transaminitis AST: ALT almost 2:1.  May have been due to alcohol use yesterday or heart failure, improved with diuresis.  Will need repeat as outpatient.    DM Continue lantus 10 units for now. Hemoglobin A1C 5.8 on 08/30/2012. Resume glipizide at discharge. Continue SSI  Anemia, normocytic, chronic, but hemoglobin slightly lower than baseline. Patient had recent fall with palpable hematoma on left flank with extensive bruising.  Likely due to chronic alcohol use.  Hgb stable around 8.8.  Anemia panel not suggestive of iron deficiency anemia given serum iron is 51 on 09/07/2012.  Thrombocytopenia, mild. Likely due to acute illness and alcohol use.   Hypothyroidism TSH:  10.1.  Free T4 0.91 on 09/07/2012. Continue synthroid at current  dose - question compliance with medications.  Defer to primary care physician for further  management.  Pain, primarily along left flank hematoma and left leg (chronic) Percocet as needed  Multiple soft tissue masses back Noncontrast CT of abdomen and pelvis shows interval decrease in size of previously described right sided presumed subcutaneous hematomas, tablet in of new presumed hyperdense hematoma over the left mid back with subcutaneous tissue associated with acute/subacute L1 was transverse fracture.  Likely will need repeat imaging with primary care physician at discharge.  New acute/subacute L1 transverse process fracture of CT of abd and pelvis Discussed with Dr. Phoebe Perch, neurosurgery, who reviewed the images and recommended conservative management.  Patient does not need outpatient followup with neurosurgery.  DIET:  Healthy heart ACCESS:  PICC IVF:  None Prophylaxis:  SQ Heparin   Code Status: Full code Family Communication: Spoke with patient Disposition Plan:  Discussed with Dr. [name] today, cardiology, plan on having the patient go to CIR tomorrow (09/08/2012) pending.   Consultants:  Cardiology  Procedures:  PICC 11/24  2-D echocardiogram on 08/30/2012 Study Conclusions - Left ventricle: The cavity size was normal. Wall thickness was increased in a pattern of mild LVH. Systolic function was normal. The estimated ejection fraction was in the range of 55% to 60%. - Mitral valve: Calcified annulus. - Left atrium: The atrium was moderately dilated. - Right ventricle: The cavity size was moderately dilated. - Right atrium: The atrium was moderately dilated. - Tricuspid valve: Moderate regurgitation. - Pulmonary arteries: Systolic pressure was severely increased.  Antibiotics:  Ceftriaxone 11/24 >> 11/26  Zosyn 11/26 >>11/28  Cipro 11/28  HPI/Subjective: No specific concerns.  Wondering if he will have to continue to take lactulose.    Objective: Filed Vitals:   09/07/12 0323 09/07/12 0352 09/07/12 0730 09/07/12 0800  BP: 131/64  109/59    Pulse:   75   Temp: 98 F (36.7 C)  97.4 F (36.3 C)   TempSrc: Oral  Oral   Resp:   18   Height:      Weight:  119.75 kg (264 lb)    SpO2:   96% 85%    Intake/Output Summary (Last 24 hours) at 09/07/12 0926 Last data filed at 09/07/12 0700  Gross per 24 hour  Intake   1580 ml  Output   3100 ml  Net  -1520 ml   Filed Weights   09/05/12 0904 09/06/12 0600 09/07/12 0352  Weight: 124.1 kg (273 lb 9.5 oz) 121.564 kg (268 lb) 119.75 kg (264 lb)    Exam: Physical Exam: General: Awake, Oriented, No acute distress. HEENT: EOMI, JVD elevated. Neck: Irregularly irregular. CV: S1 and S2 Lungs: Clear to ascultation bilaterally Abdomen: Soft, Nontender, Nondistended, +bowel sounds. Ext: Good pulses. 1+ LE edema. Back: Fist sized free moving lesion on mid back (likely lipoma).  Data Reviewed: Basic Metabolic Panel:  Lab 09/07/12 1478 09/06/12 0500 09/05/12 0500 09/04/12 0430 09/03/12 0605  NA 137 136 136 139 138  K 3.5 3.1* 3.2* 3.1* 3.5  CL 91* 88* 90* 91* 93*  CO2 39* 39* 39* 39* 36*  GLUCOSE 154* 189* 182* 162* 146*  BUN 57* 57* 59* 62* 65*  CREATININE 1.40* 1.17 1.13 1.18 1.21  CALCIUM 9.4 9.5 9.4 9.5 9.3  MG 1.5 -- -- -- --  PHOS -- -- -- -- --   Liver Function Tests:  Lab 09/02/12 0658 09/01/12 0515  AST 68* 97*  ALT 76* 88*  ALKPHOS 79 82  BILITOT 0.8  0.9  PROT 6.7 6.4  ALBUMIN 2.8* 2.8*   No results found for this basename: LIPASE:5,AMYLASE:5 in the last 168 hours No results found for this basename: AMMONIA:5 in the last 168 hours CBC:  Lab 09/07/12 0500 09/06/12 0500 09/05/12 0500 09/04/12 0430 09/03/12 0605  WBC 6.3 6.4 5.6 6.7 6.8  NEUTROABS -- -- -- -- --  HGB 8.1* 8.7* 8.4* 8.7* 9.2*  HCT 28.0* 29.8* 29.4* 30.6* 32.2*  MCV 87.5 89.2 88.6 87.7 89.7  PLT 128* 135* 132* 150 120*   Cardiac Enzymes: No results found for this basename: CKTOTAL:5,CKMB:5,CKMBINDEX:5,TROPONINI:5 in the last 168 hours BNP (last 3 results)  Basename 08/30/12 0416  10/13/11 0552 10/07/11 0407  PROBNP 18070.0* 5170.0* 14242.0*   CBG:  Lab 09/07/12 0743 09/06/12 2201 09/06/12 1641 09/06/12 1142 09/06/12 0805  GLUCAP 160* 201* 156* 167* 145*    Recent Results (from the past 240 hour(s))  URINE CULTURE     Status: Normal   Collection Time   09/04/12  1:15 PM      Component Value Range Status Comment   Specimen Description URINE, CLEAN CATCH   Final    Special Requests NONE   Final    Culture  Setup Time 09/04/2012 13:37   Final    Colony Count >=100,000 COLONIES/ML   Final    Culture PSEUDOMONAS AERUGINOSA   Final    Report Status 09/06/2012 FINAL   Final    Organism ID, Bacteria PSEUDOMONAS AERUGINOSA   Final   MRSA PCR SCREENING     Status: Normal   Collection Time   09/05/12  3:29 PM      Component Value Range Status Comment   MRSA by PCR NEGATIVE  NEGATIVE Final      Studies: No results found.  Scheduled Meds:    . [COMPLETED] acetaZOLAMIDE  250 mg Oral BID  . aspirin  325 mg Oral Daily  . carvedilol  6.25 mg Oral BID WC  . FLUoxetine  20 mg Oral Daily  . folic acid  1 mg Oral Daily  . gabapentin  300 mg Oral Daily  . heparin subcutaneous  5,000 Units Subcutaneous Q8H  . insulin aspart  0-9 Units Subcutaneous TID WC  . insulin glargine  10 Units Subcutaneous QHS  . lactulose  30 g Oral Daily  . levothyroxine  150 mcg Oral QAC breakfast  . metolazone  5 mg Oral BID  . multivitamin with minerals  1 tablet Oral Daily  . piperacillin-tazobactam (ZOSYN)  IV  3.375 g Intravenous Q8H  . potassium chloride  40 mEq Oral TID  . simvastatin  40 mg Oral QHS  . sodium chloride  10-40 mL Intracatheter Q12H  . sodium chloride  3 mL Intravenous Q12H  . spironolactone  25 mg Oral Daily   Continuous Infusions:    . furosemide (LASIX) infusion 15 mg/hr (09/06/12 1619)    Principal Problem:  *Acute on chronic diastolic CHF (congestive heart failure) Active Problems:  DM  HYPERLIPIDEMIA  HYPERTENSION  CHRONIC OBSTRUCTIVE PULMONARY  DISEASE  CAD  ETOH abuse  Atrial fibrillation  Hematoma-on back, sacral area, right chest wall post fall  Acute renal failure  Weakness generalized  Hypothyroidism  Acute respiratory failure with hypoxia  Elevated troponin  Elevated LFTs  Pseudomonas urinary tract infection  Hypokalemia   Dally Oshel A  [facility] Pager [phone]. If 8PM-8AM, please contact night-coverage at [email or web address], password [facility] 09/07/2012, 9:26 AM  LOS: 8 days

## 2012-09-07 NOTE — Discharge Summary (Signed)
Physician Discharge Summary  [name] XBJ:[id number] DOB: Feb 27, 1946 DOA: 08/30/2012  PCP: [name], MD  Admit date: 08/30/2012 Discharge date: 09/07/2012  Recommendations for Outpatient Follow-up:  Followup with cardiology in 1-2 weeks after discharge. Discuss with cardiology about need for anticoagulation given atrial fibrillation as outpatient, once hematomas on the back are resolved.  Followup with GIBSON,DAVID, MD (PCP) in 1 week after discharge for eletrolytes, liver function test and CBC. Please discuss with your PCP about repeating imaging for soft tissue masses (from hematomas) to make sure the hematomas are resolving.  Discharge Diagnoses:  Principal Problem:  *Acute on chronic diastolic CHF (congestive heart failure) Active Problems:  DM  HYPERLIPIDEMIA  HYPERTENSION  CHRONIC OBSTRUCTIVE PULMONARY DISEASE  CAD  ETOH abuse  Atrial fibrillation  Hematoma-on back, sacral area, right chest wall post fall  Acute renal failure  Weakness generalized  Hypothyroidism  Acute respiratory failure with hypoxia  Elevated troponin  Elevated LFTs  Pseudomonas urinary tract infection  Hypokalemia  L1 vertebral fracture  Discharge Condition: Stable  Diet recommendation: Heart healthy diet with 1.5 L of fluid restriction with 2 gm sodium diet.  Filed Weights   09/05/12 0904 09/06/12 0600 09/07/12 0352  Weight: 124.1 kg (273 lb 9.5 oz) 121.564 kg (268 lb) 119.75 kg (264 lb)    History of present illness:  66 yo male with history of chf, copd, etoh abuse, ckd, cafib, freq falls with recent large hematoma to flank area presented to [facility] with acute on chronic heart failure with cardiac hepatitis and cardiorenal syndrome and trop of 0.78. He was transferred for further management. He has been diuresed with lasix gtt and metolazone but has been transferred to stepdown for UF by Dr. [name], so far the patient has not required ultrafiltration and has been diuresis to  with Lasix drip. His transaminitis and AKI are resolving. Patient's AMS was likely medication-related in setting of AKI and sedating medications were minimized. He had asterixis on exam which improved with lactulose. Mentation markedly improved. Found to have pseudomonas UTI on 09/05/2012. Hematomas on back and left leg pain are chronic.   Hospital Course:  Diastolic heart failure, acute on chronic exacerbation, also severe pulmonary hypertension  Initial weight on admission was 126.1 kg (278 lbs), weight today was 119.75 kg (264 lbs). I/O negative -15.3 L to date. Initial troponin elevation likely due to heart strain as was the case in 09/2011. ECHO 11/20 demonstrated normal systolic function and EF of 55-60% with moderately dilated left atrium. The PA pressure was severely elevated with moderate TR and moderately dilated RA. Given mild bump in creatinine, Lasix gtt and metolazone discontinued (11/22-11/28), has not been started or needed UF yet. Cardiology transitioned the patient to oral torsemide. Current regimen includes torsemide 40 mg daily, spironolactone 25 mg daily, carvedilol 6.25 mg bid.   Pseudomonas UTI  Dysuria not improved with ceftriaxone and pyridium initially and was placed on zosyn. Culture growing Pseudomonas which is pansensitive. Transition from zosyn to ciprofloxacin on 09/07/2012. Antibiotics since 09/03/2012, define 7-10 day course of antibiotics. Discontinue foley catheter tomorrow.   AMS/Acute delirium, resolving  Likely medication side effect, but patient also had asterixis despite normal ammonia level. TSH was mildly elevated, check Free T4 normal at 0.91, some question about patient compliance, continued previous dose of synthroid. Patient's asterixis has resolved with lactulose. Vitamin B12 606. Vitamin B1 24, normal. RPR normal. Continue lactulose.  CAD s/p 4V CABG 2001, neg myoview 2010  Mild elevation in initial troponin, now normalized,  likely due to acute on chronic  CHF. Continue asa, statin and BB.  Atrial fibrillation, rate controlled  CHADS2 of 3, but not on warfarin/other anticoagulation due to alcohol use. Continue ASA and Carvedilol. Defer to cardiology for consideration of starting anticoagulation as outpatient. Also has traumatic hematomas on back, as a result would not be a candidate at this time.  Cardiorenal syndrome  Baseline creatinine 0.9, initially 1.9 and trending down with diuresis. Lasix and metolazone discontinued due to mild bump in creatinine.   Hypokalemia  KCL PO BID. Resolved with replacement. Magnesium normal.  Transaminitis  AST: ALT almost 2:1. May have been due to alcohol use yesterday or heart failure, improved with diuresis. Will need repeat as outpatient.   DM  Lantus 10 units and SSI during the hospital stay, hemoglobin A1C 5.8 on 08/30/2012, resume glipizide at discharge.  Anemia, normocytic, chronic, but hemoglobin slightly lower than baseline.  Patient had recent fall with palpable hematoma on left flank with extensive bruising. Likely due to chronic alcohol use. Hgb stable around 8.8. Anemia panel not suggestive of iron deficiency anemia given serum iron is 51 on 09/07/2012.   Thrombocytopenia, mild.  Likely due to acute illness and alcohol use.   Hypothyroidism  TSH: 10.1. Free T4 0.91 on 09/07/2012. Continue synthroid at current dose - question compliance with medications. Defer to primary care physician for further management.   Multiple soft tissue masses back bilaterally / primarily along left flank hematoma and left leg (chronic) Percocet as needed. Noncontrast CT of abdomen and pelvis shows interval decrease in size of previously described right sided presumed subcutaneous hematomas, development of new presumed hyperdense hematoma over the left mid back with subcutaneous tissue associated with acute/subacute L1 was transverse fracture. Likely will need repeat imaging with primary care physician at  discharge.   New acute/subacute L1 transverse process fracture of CT of abd and pelvis  Discussed with Dr. Phoebe Perch, neurosurgery, on 09/07/2012 who reviewed the images and recommended conservative management. Patient does not need outpatient followup with neurosurgery.   Consultants:  Cardiology  Procedures:  PICC 11/24  2-D echocardiogram on 08/30/2012 Study Conclusions - Left ventricle: The cavity size was normal. Wall thickness was increased in a pattern of mild LVH. Systolic function was normal. The estimated ejection fraction was in the range of 55% to 60%. - Mitral valve: Calcified annulus. - Left atrium: The atrium was moderately dilated. - Right ventricle: The cavity size was moderately dilated. - Right atrium: The atrium was moderately dilated. - Tricuspid valve: Moderate regurgitation. - Pulmonary arteries: Systolic pressure was severely increased.  Antibiotics:  Ceftriaxone 11/24 >> 11/26  Zosyn 11/26 >>11/28  Cipro 11/28 Till 12/3 (to complete 10 day course).  Discharge Exam: Filed Vitals:   09/07/12 0352 09/07/12 0730 09/07/12 0800 09/07/12 0950  BP:  109/59    Pulse:  75    Temp:  97.4 F (36.3 C)    TempSrc:  Oral    Resp:  18    Height:      Weight: 119.75 kg (264 lb)     SpO2:  96% 85% 89%   Discharge Instructions  Discharge Orders    Future Orders Please Complete By Expires   Diet - low sodium heart healthy      Comments:   1.5 L with 2 gm sodium diet.   Increase activity slowly      (HEART FAILURE PATIENTS) Call MD:  Anytime you have any of the following symptoms: 1) 3 pound weight  gain in 24 hours or 5 pounds in 1 week 2) shortness of breath, with or without a dry hacking cough 3) swelling in the hands, feet or stomach 4) if you have to sleep on extra pillows at night in order to breathe.      Heart Failure patients record your daily weight using the same scale at the same time of day      Discharge instructions      Comments:   Followup with  cardiology in 1-2 weeks after discharge. Discuss with cardiology about need for anticoagulation given atrial fibrillation as outpatient.  Followup with GIBSON,DAVID, MD (PCP) in 1 week after discharge for eletrolytes, liver function test and CBC. Please discuss with your PCP about repeating imaging for soft tissue masses (from hematomas) to make sure the hematomas are resolving.   Contraindication to ACEI at discharge      Comments:   EF greater than 40%, does not need ACE/ARB.       Medication List     As of 09/07/2012  5:25 PM    STOP taking these medications         cephALEXin 500 MG capsule   Commonly known as: KEFLEX      furosemide 80 MG tablet   Commonly known as: LASIX      HYDROmorphone 2 MG tablet   Commonly known as: DILAUDID      LORazepam 1 MG tablet   Commonly known as: ATIVAN      metoprolol 50 MG tablet   Commonly known as: LOPRESSOR      TAKE these medications         acetaminophen 325 MG tablet   Commonly known as: TYLENOL   Take 2 tablets (650 mg total) by mouth every 4 (four) hours as needed.      aspirin 325 MG tablet   Take 325 mg by mouth daily.      carvedilol 6.25 MG tablet   Commonly known as: COREG   Take 1 tablet (6.25 mg total) by mouth 2 (two) times daily with a meal.      ciprofloxacin 500 MG tablet   Commonly known as: CIPRO   Take 1 tablet (500 mg total) by mouth 2 (two) times daily. Discontinue after 09/12/2012.      FLUoxetine 20 MG capsule   Commonly known as: PROZAC   Take 20 mg by mouth daily.      fluticasone 220 MCG/ACT inhaler   Commonly known as: FLOVENT HFA   Inhale 1 puff into the lungs 2 (two) times daily.      folic acid 1 MG tablet   Commonly known as: FOLVITE   Take 1 tablet (1 mg total) by mouth daily.      gabapentin 300 MG capsule   Commonly known as: NEURONTIN   Take 1 capsule (300 mg total) by mouth daily.      glipiZIDE 10 MG tablet   Commonly known as: GLUCOTROL   Take 10 mg by mouth 2 (two) times  daily before a meal.      lactulose 10 GM/15ML solution   Commonly known as: CHRONULAC   Take 45 mLs (30 g total) by mouth daily.      levothyroxine 150 MCG tablet   Commonly known as: SYNTHROID, LEVOTHROID   Take 150 mcg by mouth daily.      magnesium oxide 400 MG tablet   Commonly known as: MAG-OX   Take 400 mg by mouth 2 (two) times  daily.      multivitamin capsule   Take 1 capsule by mouth daily.      omeprazole 20 MG capsule   Commonly known as: PRILOSEC   Take 20 mg by mouth daily.      oxyCODONE-acetaminophen 5-325 MG per tablet   Commonly known as: PERCOCET/ROXICET   Take 1-2 tablets by mouth every 6 (six) hours as needed.      polyethylene glycol packet   Commonly known as: MIRALAX / GLYCOLAX   Take 17 g by mouth daily as needed.      potassium chloride SA 20 MEQ tablet   Commonly known as: K-DUR,KLOR-CON   Take 2 tablets (40 mEq total) by mouth 2 (two) times daily.      senna 8.6 MG tablet   Commonly known as: SENOKOT   Take 1 tablet by mouth daily. As needed      simvastatin 40 MG tablet   Commonly known as: ZOCOR   Take 40 mg by mouth at bedtime.      spironolactone 25 MG tablet   Commonly known as: ALDACTONE   Take 1 tablet (25 mg total) by mouth daily.      torsemide 20 MG tablet   Commonly known as: DEMADEX   Take 2 tablets (40 mg total) by mouth daily.      traZODone 50 MG tablet   Commonly known as: DESYREL   Take 50-100 mg by mouth at bedtime as needed. sleep           Follow-up Information    Follow up with GIBSON,DAVID, MD. Schedule an appointment as soon as possible for a visit in 1 week. (For labs and repeat imaging of your back to ensure hematomas are resolving.)    Contact information:   [address] [place] [address] [phone]       Follow up with [facility]). Schedule an appointment as soon as possible for a visit in 2 weeks.   Contact information:   [address] [place] [address] [phone]          The results of significant diagnostics from this hospitalization (including imaging, microbiology, ancillary and laboratory) are listed below for reference.    Significant Diagnostic Studies: Ct Abdomen Pelvis Wo Contrast  09/07/2012  *RADIOLOGY REPORT*  Clinical Data: Subcutaneous masses on the patient's back  CT ABDOMEN AND PELVIS WITHOUT CONTRAST  Technique:  Multidetector CT imaging of the abdomen and pelvis was performed following the standard protocol without intravenous contrast.  Comparison: [facility]2013[facility]  Findings: Again noted are multiple subcutaneous masses in the soft tissues of the back, of varying sizes and shapes. Dominant/ previously measured lesions are as follows:  Right mid posterior back, previously 5.3 cm, now 3.5 cm image 33.  Inferior aspect, right back/pelvis, previously 3.5 cm maximally now 2.5 cm in similar dimension image 69.  New area of inhomogeneous subcutaneous hyperdensity will lobulated appearance is noted over the left mid posterior back within the subcutaneous tissues and is incompletely included in the field of view due to streak artifact.  This measures at least 15.0 x 3.9 cm image 40.  Stranding in the anterior abdominal wall is again noted.  Right lower quadrant fat fluid containing ventral hernia again noted.  Bilateral lower lobe presumed compressive atelectasis.  Mild cardiomegaly again noted.  Extensive coronary arterial calcifications.  Unenhanced liver, gallbladder, spleen, pancreas, and adrenal glands are normal.  Bilateral  hypodense renal cortical lesions, some of which are large enough to characterize as cysts for example right mid kidney 4.1 cm cyst, are unchanged.  No new hydronephrosis or perinephric hematoma.  Extensive atherosclerotic aortic calcification without aneurysm.  No bowel wall thickening or focal segmental dilatation. Scattered colonic diverticuli noted without  evidence for diverticulitis. Lumbar spine degenerative change is noted.  Left L1 transverse process fracture is new since the prior study.  IMPRESSION: Interval decrease in size of previously described right sided presumed subcutaneous hematomas given that previous history of trauma.  In the interval between the prior examination at [facility] 08/24/2012, there has been development of a new presumed hyperdense hematoma over the left mid back within the subcutaneous soft tissues with associated acute / subacute L1 transverse process fracture.   Original Report Authenticated By: Christiana Pellant, M.D.    Dg Chest 2 View  08/31/2012  *RADIOLOGY REPORT*  Clinical Data: Shortness of breath, recent fall with bruise over left posterior chest  CHEST - 2 VIEW  Comparison: Portable chest x-ray of 08/29/2012  Findings: There is cardiomegaly present with probable moderate pulmonary vascular congestion noted.  No effusion is seen.  Median sternotomy sutures are noted.  IMPRESSION: Cardiomegaly and probable moderate pulmonary vascular congestion.   Original Report Authenticated By: Dwyane Dee, M.D.    US Renal  08/30/2012  *RADIOLOGY REPORT*  Clinical Data:  Acute renal insufficiency.  Diabetes and hypertension.  RENAL/URINARY TRACT ULTRASOUND COMPLETE  Comparison:  07/27/2012  Findings:  Right Kidney:  Measures 13.6 cm.  Normal in size and parenchymal echogenicity.  No evidence of mass or hydronephrosis. Cyst within the upper pole of the right kidney measures 3.2 x 2.7 x 3.6 cm.  Left Kidney:  Measures 14.5 cm.  Normal in size and parenchymal echogenicity.  No evidence of mass or hydronephrosis. There is a 2.6 x 2.0 x 2.2 cm cyst in the upper pole. A 3.3 x 2.75-0.4 cm cyst is noted in the inferior pole.  Bladder:  Not visualize secondary to postvoid state.  IMPRESSION:  1.  No obstructive uropathy. 2.  Bilateral renal cysts.   Original Report Authenticated By: Signa Kell, M.D.    Dg Chest Port 1  View  09/03/2012  *RADIOLOGY REPORT*  Clinical Data: PICC line placement.  PORTABLE CHEST - 1 VIEW  Comparison: 08/31/2012  Findings: Right upper extremity PICC line tip is in the lower SVC. Lungs show improved aeration since the prior chest x-ray with suggestion of potentially mild residual interstitial edema.  The heart size is stable.  IMPRESSION: PICC line tip is in the lower SVC.  Improved pulmonary edema.   Original Report Authenticated By: Irish Lack, M.D.     Microbiology: Recent Results (from the past 240 hour(s))  URINE CULTURE     Status: Normal   Collection Time   09/04/12  1:15 PM      Component Value Range Status Comment   Specimen Description URINE, CLEAN CATCH   Final    Special Requests NONE   Final    Culture  Setup Time 09/04/2012 13:37   Final    Colony Count >=100,000 COLONIES/ML   Final    Culture PSEUDOMONAS AERUGINOSA   Final    Report Status 09/06/2012 FINAL   Final    Organism ID, Bacteria PSEUDOMONAS AERUGINOSA   Final   MRSA PCR SCREENING     Status: Normal   Collection Time   09/05/12  3:29 PM      Component Value Range  Status Comment   MRSA by PCR NEGATIVE  NEGATIVE Final      Labs: Basic Metabolic Panel:  Lab 09/07/12 1610 09/06/12 0500 09/05/12 0500 09/04/12 0430 09/03/12 0605  NA 137 136 136 139 138  K 3.5 3.1* 3.2* 3.1* 3.5  CL 91* 88* 90* 91* 93*  CO2 39* 39* 39* 39* 36*  GLUCOSE 154* 189* 182* 162* 146*  BUN 57* 57* 59* 62* 65*  CREATININE 1.40* 1.17 1.13 1.18 1.21  CALCIUM 9.4 9.5 9.4 9.5 9.3  MG 1.5 -- -- -- --  PHOS -- -- -- -- --   Liver Function Tests:  Lab 09/02/12 0658 09/01/12 0515  AST 68* 97*  ALT 76* 88*  ALKPHOS 79 82  BILITOT 0.8 0.9  PROT 6.7 6.4  ALBUMIN 2.8* 2.8*   No results found for this basename: LIPASE:5,AMYLASE:5 in the last 168 hours  Lab 09/07/12 1215  AMMONIA 32   CBC:  Lab 09/07/12 0500 09/06/12 0500 09/05/12 0500 09/04/12 0430 09/03/12 0605  WBC 6.3 6.4 5.6 6.7 6.8  NEUTROABS -- -- -- -- --   HGB 8.1* 8.7* 8.4* 8.7* 9.2*  HCT 28.0* 29.8* 29.4* 30.6* 32.2*  MCV 87.5 89.2 88.6 87.7 89.7  PLT 128* 135* 132* 150 120*   Cardiac Enzymes: No results found for this basename: CKTOTAL:5,CKMB:5,CKMBINDEX:5,TROPONINI:5 in the last 168 hours BNP: BNP (last 3 results)  Basename 08/30/12 0416 10/13/11 0552 10/07/11 0407  PROBNP 18070.0* 5170.0* 14242.0*   CBG:  Lab 09/07/12 1714 09/07/12 1232 09/07/12 0743 09/06/12 2201 09/06/12 1641  GLUCAP 153* 264* 160* 201* 156*    Time spent: 40 minutes   Signed:  Ellieana Dolecki A  [facility] 09/07/2012, 5:25 PM

## 2012-09-08 ENCOUNTER — Inpatient Hospital Stay (HOSPITAL_COMMUNITY)
Admission: RE | Admit: 2012-09-08 | Discharge: 2012-09-21 | DRG: 945 | Disposition: A | Discharge status: Home Health | Payer: Medicare Other | Source: Ambulatory Visit | Attending: Physical Medicine & Rehabilitation | Admitting: Physical Medicine & Rehabilitation

## 2012-09-08 ENCOUNTER — Encounter (HOSPITAL_COMMUNITY): Payer: Self-pay | Admitting: *Deleted

## 2012-09-08 DIAGNOSIS — R5381 Other malaise: Secondary | ICD-10-CM

## 2012-09-08 DIAGNOSIS — G92 Toxic encephalopathy: Secondary | ICD-10-CM | POA: Diagnosis present

## 2012-09-08 DIAGNOSIS — S32009A Unspecified fracture of unspecified lumbar vertebra, initial encounter for closed fracture: Secondary | ICD-10-CM

## 2012-09-08 DIAGNOSIS — J449 Chronic obstructive pulmonary disease, unspecified: Secondary | ICD-10-CM | POA: Diagnosis present

## 2012-09-08 DIAGNOSIS — I4891 Unspecified atrial fibrillation: Secondary | ICD-10-CM

## 2012-09-08 DIAGNOSIS — J9601 Acute respiratory failure with hypoxia: Secondary | ICD-10-CM

## 2012-09-08 DIAGNOSIS — G4733 Obstructive sleep apnea (adult) (pediatric): Secondary | ICD-10-CM | POA: Diagnosis present

## 2012-09-08 DIAGNOSIS — B965 Pseudomonas (aeruginosa) (mallei) (pseudomallei) as the cause of diseases classified elsewhere: Secondary | ICD-10-CM | POA: Diagnosis present

## 2012-09-08 DIAGNOSIS — E119 Type 2 diabetes mellitus without complications: Secondary | ICD-10-CM | POA: Diagnosis present

## 2012-09-08 DIAGNOSIS — I5033 Acute on chronic diastolic (congestive) heart failure: Secondary | ICD-10-CM

## 2012-09-08 DIAGNOSIS — I872 Venous insufficiency (chronic) (peripheral): Secondary | ICD-10-CM | POA: Diagnosis present

## 2012-09-08 DIAGNOSIS — E785 Hyperlipidemia, unspecified: Secondary | ICD-10-CM | POA: Diagnosis present

## 2012-09-08 DIAGNOSIS — F1010 Alcohol abuse, uncomplicated: Secondary | ICD-10-CM | POA: Diagnosis present

## 2012-09-08 DIAGNOSIS — J4489 Other specified chronic obstructive pulmonary disease: Secondary | ICD-10-CM | POA: Diagnosis present

## 2012-09-08 DIAGNOSIS — N390 Urinary tract infection, site not specified: Secondary | ICD-10-CM | POA: Diagnosis present

## 2012-09-08 DIAGNOSIS — Z5189 Encounter for other specified aftercare: Principal | ICD-10-CM

## 2012-09-08 DIAGNOSIS — I69919 Unspecified symptoms and signs involving cognitive functions following unspecified cerebrovascular disease: Secondary | ICD-10-CM

## 2012-09-08 DIAGNOSIS — I509 Heart failure, unspecified: Secondary | ICD-10-CM | POA: Diagnosis present

## 2012-09-08 DIAGNOSIS — E876 Hypokalemia: Secondary | ICD-10-CM | POA: Diagnosis present

## 2012-09-08 DIAGNOSIS — L02419 Cutaneous abscess of limb, unspecified: Secondary | ICD-10-CM | POA: Diagnosis present

## 2012-09-08 DIAGNOSIS — N179 Acute kidney failure, unspecified: Secondary | ICD-10-CM | POA: Diagnosis present

## 2012-09-08 DIAGNOSIS — Z87891 Personal history of nicotine dependence: Secondary | ICD-10-CM

## 2012-09-08 DIAGNOSIS — G929 Unspecified toxic encephalopathy: Secondary | ICD-10-CM | POA: Diagnosis present

## 2012-09-08 DIAGNOSIS — F1210 Cannabis abuse, uncomplicated: Secondary | ICD-10-CM | POA: Diagnosis present

## 2012-09-08 DIAGNOSIS — E039 Hypothyroidism, unspecified: Secondary | ICD-10-CM | POA: Diagnosis present

## 2012-09-08 DIAGNOSIS — I2510 Atherosclerotic heart disease of native coronary artery without angina pectoris: Secondary | ICD-10-CM | POA: Diagnosis present

## 2012-09-08 DIAGNOSIS — N189 Chronic kidney disease, unspecified: Secondary | ICD-10-CM | POA: Diagnosis present

## 2012-09-08 DIAGNOSIS — Z951 Presence of aortocoronary bypass graft: Secondary | ICD-10-CM

## 2012-09-08 DIAGNOSIS — I129 Hypertensive chronic kidney disease with stage 1 through stage 4 chronic kidney disease, or unspecified chronic kidney disease: Secondary | ICD-10-CM | POA: Diagnosis present

## 2012-09-08 LAB — BASIC METABOLIC PANEL
BUN: 59 mg/dL — ABNORMAL HIGH (ref 6–23)
CO2: 39 mEq/L — ABNORMAL HIGH (ref 19–32)
GFR calc non Af Amer: 43 mL/min — ABNORMAL LOW (ref >90)
Glucose, Bld: 151 mg/dL — ABNORMAL HIGH (ref 70–99)
Potassium: 3.8 mEq/L (ref 3.5–5.1)
Sodium: 138 mEq/L (ref 135–145)

## 2012-09-08 LAB — CBC
HCT: 29.6 % — ABNORMAL LOW (ref 39.0–52.0)
Hemoglobin: 8.6 g/dL — ABNORMAL LOW (ref 13.0–17.0)
MCHC: 29.1 g/dL — ABNORMAL LOW (ref 30.0–36.0)
RBC: 3.36 MIL/uL — ABNORMAL LOW (ref 4.22–5.81)

## 2012-09-08 LAB — GLUCOSE, CAPILLARY
Glucose-Capillary: 213 mg/dL — ABNORMAL HIGH (ref 70–99)
Glucose-Capillary: 162 mg/dL — ABNORMAL HIGH (ref 70–99)
Glucose-Capillary: 211 mg/dL — ABNORMAL HIGH (ref 70–99)

## 2012-09-08 MED ORDER — OXYCODONE-ACETAMINOPHEN 5-325 MG PO TABS
1.0000 | ORAL_TABLET | Freq: Four times a day (QID) | ORAL | Status: DC | PRN
  Administered 2012-09-08 – 2012-09-09 (×4): 2 via ORAL
  Administered 2012-09-10: 1 via ORAL
  Administered 2012-09-10 – 2012-09-11 (×2): 2 via ORAL
  Administered 2012-09-11 (×2): 1 via ORAL
  Administered 2012-09-12 – 2012-09-20 (×24): 2 via ORAL
  Administered 2012-09-20: 1 via ORAL
  Administered 2012-09-20 – 2012-09-21 (×3): 2 via ORAL
  Filled 2012-09-08 (×9): qty 2
  Filled 2012-09-08: qty 1
  Filled 2012-09-08 (×5): qty 2
  Filled 2012-09-08: qty 1
  Filled 2012-09-08 (×10): qty 2
  Filled 2012-09-08: qty 1
  Filled 2012-09-08 (×11): qty 2
  Filled 2012-09-08: qty 1
  Filled 2012-09-08: qty 2

## 2012-09-08 MED ORDER — ONDANSETRON HCL 4 MG PO TABS: 4.0000 mg | ORAL_TABLET | Freq: Four times a day (QID) | ORAL | Status: DC | PRN

## 2012-09-08 MED ORDER — ADULT MULTIVITAMIN W/MINERALS CH
1.0000 | ORAL_TABLET | Freq: Every day | ORAL | Status: DC
  Administered 2012-09-09 – 2012-09-21 (×13): 1 via ORAL
  Filled 2012-09-08 (×14): qty 1

## 2012-09-08 MED ORDER — POTASSIUM CHLORIDE CRYS ER 20 MEQ PO TBCR
40.0000 meq | EXTENDED_RELEASE_TABLET | Freq: Two times a day (BID) | ORAL | Status: DC
  Administered 2012-09-08 – 2012-09-21 (×26): 40 meq via ORAL
  Filled 2012-09-08 (×27): qty 2

## 2012-09-08 MED ORDER — VANCOMYCIN HCL 1000 MG IV SOLR
1500.0000 mg | INTRAVENOUS | Status: DC
  Filled 2012-09-08: qty 1500

## 2012-09-08 MED ORDER — POLYETHYLENE GLYCOL 3350 17 G PO PACK
17.0000 g | PACK | Freq: Every day | ORAL | Status: DC | PRN
  Filled 2012-09-08: qty 1

## 2012-09-08 MED ORDER — SIMVASTATIN 40 MG PO TABS
40.0000 mg | ORAL_TABLET | Freq: Every day | ORAL | Status: DC
  Administered 2012-09-08 – 2012-09-20 (×13): 40 mg via ORAL
  Filled 2012-09-08 (×14): qty 1

## 2012-09-08 MED ORDER — FLUOXETINE HCL 20 MG PO CAPS
20.0000 mg | ORAL_CAPSULE | Freq: Every day | ORAL | Status: DC
  Administered 2012-09-09 – 2012-09-21 (×13): 20 mg via ORAL
  Filled 2012-09-08 (×14): qty 1

## 2012-09-08 MED ORDER — INSULIN ASPART 100 UNIT/ML ~~LOC~~ SOLN
0.0000 [IU] | Freq: Three times a day (TID) | SUBCUTANEOUS | Status: DC
  Administered 2012-09-08: 2 [IU] via SUBCUTANEOUS
  Administered 2012-09-09 (×3): 1 [IU] via SUBCUTANEOUS
  Administered 2012-09-10: 2 [IU] via SUBCUTANEOUS
  Administered 2012-09-10 (×2): 1 [IU] via SUBCUTANEOUS
  Administered 2012-09-11: 2 [IU] via SUBCUTANEOUS
  Administered 2012-09-11 – 2012-09-18 (×12): 1 [IU] via SUBCUTANEOUS
  Administered 2012-09-18: 17:00:00 via SUBCUTANEOUS
  Administered 2012-09-19 – 2012-09-21 (×2): 1 [IU] via SUBCUTANEOUS

## 2012-09-08 MED ORDER — GABAPENTIN 300 MG PO CAPS
300.0000 mg | ORAL_CAPSULE | Freq: Every day | ORAL | Status: DC
  Administered 2012-09-09 – 2012-09-21 (×13): 300 mg via ORAL
  Filled 2012-09-08 (×14): qty 1

## 2012-09-08 MED ORDER — TORSEMIDE 20 MG PO TABS
40.0000 mg | ORAL_TABLET | Freq: Every day | ORAL | Status: DC
  Administered 2012-09-09 – 2012-09-10 (×2): 40 mg via ORAL
  Filled 2012-09-08 (×5): qty 2

## 2012-09-08 MED ORDER — INSULIN GLARGINE 100 UNIT/ML ~~LOC~~ SOLN
10.0000 [IU] | Freq: Every day | SUBCUTANEOUS | Status: DC
  Administered 2012-09-08 – 2012-09-20 (×13): 10 [IU] via SUBCUTANEOUS

## 2012-09-08 MED ORDER — ASPIRIN 325 MG PO TABS
325.0000 mg | ORAL_TABLET | Freq: Every day | ORAL | Status: DC
  Administered 2012-09-09 – 2012-09-21 (×13): 325 mg via ORAL
  Filled 2012-09-08 (×15): qty 1

## 2012-09-08 MED ORDER — VANCOMYCIN HCL 1000 MG IV SOLR
1500.0000 mg | INTRAVENOUS | Status: DC
  Administered 2012-09-09 – 2012-09-12 (×4): 1500 mg via INTRAVENOUS
  Filled 2012-09-08 (×4): qty 1500

## 2012-09-08 MED ORDER — LACTULOSE 10 GM/15ML PO SOLN
30.0000 g | Freq: Every day | ORAL | Status: DC
  Administered 2012-09-10 – 2012-09-20 (×10): 30 g via ORAL
  Filled 2012-09-08 (×15): qty 45

## 2012-09-08 MED ORDER — CARVEDILOL 6.25 MG PO TABS
6.2500 mg | ORAL_TABLET | Freq: Two times a day (BID) | ORAL | Status: DC
  Administered 2012-09-08 – 2012-09-21 (×26): 6.25 mg via ORAL
  Filled 2012-09-08 (×28): qty 1

## 2012-09-08 MED ORDER — ACETAMINOPHEN 325 MG PO TABS
325.0000 mg | ORAL_TABLET | ORAL | Status: DC | PRN
  Administered 2012-09-15 – 2012-09-19 (×3): 650 mg via ORAL
  Filled 2012-09-08 (×3): qty 2

## 2012-09-08 MED ORDER — SPIRONOLACTONE 25 MG PO TABS
25.0000 mg | ORAL_TABLET | Freq: Every day | ORAL | Status: DC
  Administered 2012-09-09 – 2012-09-21 (×13): 25 mg via ORAL
  Filled 2012-09-08 (×14): qty 1

## 2012-09-08 MED ORDER — CIPROFLOXACIN HCL 500 MG PO TABS
500.0000 mg | ORAL_TABLET | Freq: Two times a day (BID) | ORAL | Status: AC
  Administered 2012-09-08 – 2012-09-14 (×12): 500 mg via ORAL
  Filled 2012-09-08 (×12): qty 1

## 2012-09-08 MED ORDER — FOLIC ACID 1 MG PO TABS
1.0000 mg | ORAL_TABLET | Freq: Every day | ORAL | Status: DC
  Administered 2012-09-09 – 2012-09-21 (×13): 1 mg via ORAL
  Filled 2012-09-08 (×14): qty 1

## 2012-09-08 MED ORDER — SORBITOL 70 % SOLN: 30.0000 mL | Freq: Every day | Status: DC | PRN

## 2012-09-08 MED ORDER — LEVOTHYROXINE SODIUM 150 MCG PO TABS
150.0000 ug | ORAL_TABLET | Freq: Every day | ORAL | Status: DC
Start: 2012-09-09 — End: 2012-09-21
  Administered 2012-09-09 – 2012-09-21 (×13): 150 ug via ORAL
  Filled 2012-09-08 (×14): qty 1

## 2012-09-08 MED ORDER — HEPARIN SODIUM (PORCINE) 5000 UNIT/ML IJ SOLN
5000.0000 [IU] | Freq: Three times a day (TID) | INTRAMUSCULAR | Status: DC
  Administered 2012-09-08 – 2012-09-21 (×38): 5000 [IU] via SUBCUTANEOUS
  Filled 2012-09-08 (×41): qty 1

## 2012-09-08 MED ORDER — ONDANSETRON HCL 4 MG/2ML IJ SOLN: 4.0000 mg | Freq: Four times a day (QID) | INTRAMUSCULAR | Status: DC | PRN

## 2012-09-08 MED ORDER — VANCOMYCIN HCL 1000 MG IV SOLR
2500.0000 mg | Freq: Once | INTRAVENOUS | Status: AC
  Administered 2012-09-08: 2500 mg via INTRAVENOUS
  Filled 2012-09-08: qty 2500

## 2012-09-08 NOTE — Progress Notes (Addendum)
Advanced Heart Failure Rounding Note  Subjective:  [name] is a 66 yo male with PMX s/f CAD (s/p CABG x 4 in 2001, normal Myoview 2010), chronic diastolic CHF, persistent a-fib, DM, HTN, HL, carotid artery disease, COPD, hypothyroidism, EtOH abuse, chronic venous insufficiency and morbid obesity.  He is not anticoagulants due to ETOH abuse and fall risk.   Echo 09/2011: LVEF 45-50%, mod biatral enlargement, moderate RV dilatation, moderate TR, PASP 66 mmHg.  ECHO 08/2012 EF 55-60% moderate bilateral RA/LA enlargement  Admitted 08/30/12 for acute on chronic diastolic CHF. He has been diuresed with IV lasix 40 mg bid.   Admit weight 278 but trending up to 282 pounds. After reviewing office note weights from the last 6 months his weight was 240-250 pounds.   CVP 7. Cr up slightly again 1.17->1.4->1.5.  Feels pretty good.  Denies dyspnea orthopnea, PND.  No chest pain.   CT reviewed multiple resolving SQ hematomas   Intake/Output Summary (Last 24 hours) at 09/08/12 0742 Last data filed at 09/07/12 2212  Gross per 24 hour  Intake   1125 ml  Output   1200 ml  Net    -75 ml    Current meds:    . aspirin  325 mg Oral Daily  . carvedilol  6.25 mg Oral BID WC  . ciprofloxacin  500 mg Oral BID  . FLUoxetine  20 mg Oral Daily  . folic acid  1 mg Oral Daily  . gabapentin  300 mg Oral Daily  . heparin subcutaneous  5,000 Units Subcutaneous Q8H  . insulin aspart  0-9 Units Subcutaneous TID WC  . insulin glargine  10 Units Subcutaneous QHS  . lactulose  30 g Oral Daily  . levothyroxine  150 mcg Oral QAC breakfast  . multivitamin with minerals  1 tablet Oral Daily  . potassium chloride  40 mEq Oral BID  . simvastatin  40 mg Oral QHS  . sodium chloride  10-40 mL Intracatheter Q12H  . sodium chloride  3 mL Intravenous Q12H  . spironolactone  25 mg Oral Daily  . torsemide  40 mg Oral Daily  . [DISCONTINUED] metolazone  5 mg Oral BID  . [DISCONTINUED] piperacillin-tazobactam (ZOSYN)  IV   3.375 g Intravenous Q8H  . [DISCONTINUED] potassium chloride  40 mEq Oral TID   Infusions:    . [DISCONTINUED] DOPamine    . [DISCONTINUED] furosemide (LASIX) infusion 15 mg/hr (09/07/12 1037)     Objective:  Blood pressure 128/62, pulse 61, temperature 97.9 F (36.6 C), temperature source Oral, resp. rate 18, height 5\' 9"  (1.753 m), weight 119.75 kg (264 lb), SpO2 100.00%. Weight change:   Physical Exam: General:  Chronically ill. No resp difficulty HEENT: normal Neck: supple. JVP difficult to see Carotids 2+ bilat; no bruits. No lymphadenopathy or thryomegaly appreciated. Cor: PMI nondisplaced. Regular rate & rhythm. No rubs, gallops. 1/6 SEM RUSB. Lungs: Crackles at bases bilaterally Abdomen: obese soft, nontender, nondistended. No hepatosplenomegaly. No bruits or masses. Good bowel sounds. Large irregular subQ masses on back Extremities: no cyanosis, clubbing, rash, R and LLE 1+ edema  . Diffuse erythema concerning for cellulitis Neuro: alert & orientedx3, cranial nerves grossly intact. moves all 4 extremities w/o difficulty. Affect pleasant   Basic Metabolic Panel:  Lab 09/08/12 1610 09/07/12 0500 09/06/12 0500 09/05/12 0500 09/04/12 0430  NA 138 137 136 136 139  K 3.8 3.5 -- -- --  CL 91* 91* 88* 90* 91*  CO2 39* 39* 39* 39* 39*  GLUCOSE 151* 154* 189* 182* 162*  BUN 59* 57* 57* 59* 62*  CREATININE 1.58* 1.40* 1.17 1.13 1.18  CALCIUM 9.6 9.4 9.5 9.4 9.5  MG -- 1.5 -- -- --  PHOS -- -- -- -- --   Liver Function Tests:  Lab 09/02/12 0658  AST 68*  ALT 76*  ALKPHOS 79  BILITOT 0.8  PROT 6.7  ALBUMIN 2.8*   No results found for this basename: LIPASE:5,AMYLASE:5 in the last 168 hours  Lab 09/07/12 1215  AMMONIA 32   CBC:  Lab 09/08/12 0430 09/07/12 0500 09/06/12 0500 09/05/12 0500 09/04/12 0430  WBC 6.5 6.3 6.4 5.6 6.7  NEUTROABS -- -- -- -- --  HGB 8.6* 8.1* 8.7* 8.4* 8.7*  HCT 29.6* 28.0* 29.8* 29.4* 30.6*  MCV 88.1 87.5 89.2 88.6 87.7  PLT 145*  128* 135* 132* 150   Cardiac Enzymes: No results found for this basename: CKTOTAL:5,CKMB:5,CKMBINDEX:5,TROPONINI:5 in the last 168 hours BNP: No components found with this basename: POCBNP:5 CBG:  Lab 09/07/12 2201 09/07/12 1714 09/07/12 1232 09/07/12 0743 09/06/12 2201  GLUCAP 217* 153* 264* 160* 201*   Microbiology: Lab Results  Component Value Date   CULT PSEUDOMONAS AERUGINOSA 09/04/2012   CULT NO GROWTH 10/05/2011   CULT NO GROWTH 5 DAYS 10/05/2011   CULT NO GROWTH 5 DAYS 10/05/2011   CULT NO GROWTH 5 DAYS 06/25/2010    Lab 09/04/12 1315  CULT PSEUDOMONAS AERUGINOSA  SDES URINE, CLEAN CATCH    Imaging: Ct Abdomen Pelvis Wo Contrast  09/07/2012  *RADIOLOGY REPORT*  Clinical Data: Subcutaneous masses on the patient's back  CT ABDOMEN AND PELVIS WITHOUT CONTRAST  Technique:  Multidetector CT imaging of the abdomen and pelvis was performed following the standard protocol without intravenous contrast.  Comparison: 11/14/2013[facility]  Findings: Again noted are multiple subcutaneous masses in the soft tissues of the back, of varying sizes and shapes. Dominant/ previously measured lesions are as follows:  Right mid posterior back, previously 5.3 cm, now 3.5 cm image 33.  Inferior aspect, right back/pelvis, previously 3.5 cm maximally now 2.5 cm in similar dimension image 69.  New area of inhomogeneous subcutaneous hyperdensity will lobulated appearance is noted over the left mid posterior back within the subcutaneous tissues and is incompletely included in the field of view due to streak artifact.  This measures at least 15.0 x 3.9 cm image 40.  Stranding in the anterior abdominal wall is again noted.  Right lower quadrant fat fluid containing ventral hernia again noted.  Bilateral lower lobe presumed compressive atelectasis.  Mild cardiomegaly again noted.  Extensive coronary arterial calcifications.  Unenhanced liver, gallbladder, spleen, pancreas, and adrenal glands are  normal.  Bilateral hypodense renal cortical lesions, some of which are large enough to characterize as cysts for example right mid kidney 4.1 cm cyst, are unchanged.  No new hydronephrosis or perinephric hematoma.  Extensive atherosclerotic aortic calcification without aneurysm.  No bowel wall thickening or focal segmental dilatation. Scattered colonic diverticuli noted without evidence for diverticulitis. Lumbar spine degenerative change is noted.  Left L1 transverse process fracture is new since the prior study.  IMPRESSION: Interval decrease in size of previously described right sided presumed subcutaneous hematomas given that previous history of trauma.  In the interval between the prior examination at [facility] 08/24/2012, there has been development of a new presumed hyperdense hematoma over the left mid back within the subcutaneous soft tissues with associated acute / subacute L1 transverse process fracture.   Original Report  Authenticated By: Christiana Pellant, M.D.      ASSESSMENT:   1. A/C diastolic heart failure EF 55-60% RV moderately dilated/HK with RV-RA gradient 56 mmHg.  2. Right heart failure 3. A Fib 4. Hypothyroidism 5. Obesity 6. CAD 7. Hematoma 8. Acute renal failure (baseline creatinine 0.8-1.1): suspect cardiorenal  9. Hypokalemia 10. SubQ masses on lower back = hematomas (resolving) 11. LE cellulitis  PLAN/DISCUSSION:   Volume status looks much better. CVP now 7-8. Switched to oral diuretics yesterday Cr slightly worse. Would hold the course with this. Can go to CIR and we will follow. Not candidate for coumadin with falls, hematomas, and ETOH liver disease.   Appears to have developing cellulitis on LEs. Will start vanc per pharmacy.   Shaun Runyon,MD 7:42 AM

## 2012-09-08 NOTE — Plan of Care (Signed)
Overall Plan of Care [facility]) Patient Details Name: [name] MRN: [id number] DOB: 11/12/1945  Diagnosis:  Rehabilitation for deconditioning  Primary Diagnosis:    Acute exacerbation of CHF Co-morbidities: Diabetes with neuropathy, L hip pain, afib, L1 transverse process fracture  Functional Problem List  Patient demonstrates impairments in the following areas: Balance, Bladder, Bowel, Edema, Endurance, Medication Management, Motor, Pain, Safety, Sensory  and Skin Integrity  Basic ADL's: grooming, bathing, dressing and toileting Advanced ADL's: none  Transfers:  bed mobility, bed to chair, toilet, tub/shower and car Locomotion:  ambulation, wheelchair mobility and stairs  Additional Impairments:  Other  Anticipated Outcomes Item Anticipated Outcome  Eating/Swallowing  Independent  Basic self-care  Minimal assistance for lower body dressing  Tolieting  Minimal asisstance  Bowel/Bladder  Continent of bowel and bladder with minimal assist  Transfers  Supervision toilet; supervision bed; min assist car   Locomotion  Gait x 25' with min assist in home and controlled env W/c x 150'  With supervision controlled env, x 50' home env  Communication    Cognition  Supervision-Mod I  Pain  3 or less  Safety/Judgment  Supervision-Mod I  Other  Pt will remain free of infection and free of any new skin breakdown with minimal assist   Therapy Plan: PT frequency: 1-2x day, 60-90 min, 5 out of 7 days   OT Frequency: 1-2 X/day, 60-90 minutes SLP Frequency: 1-2 X/day, 30-60 minutes   Team Interventions: Item RN PT OT SLP SW TR Other  Self Care/Advanced ADL Retraining   x      Neuromuscular Re-Education         Therapeutic Activities  x x x     UE/LE Strength Training/ROM  x x      UE/LE Coordination Activities  x       Visual/Perceptual Remediation/Compensation         DME/Adaptive Equipment Instruction  x x      Therapeutic Exercise  x x      Balance/Vestibular Training   x x      Patient/Family Education x x x x     Cognitive Remediation/Compensation   x x     Functional Mobility Training  x x      Ambulation/Gait Training  x       Museum/gallery curator  x       Wheelchair Propulsion/Positioning  x       Energy manager         Bladder Management x        Bowel Management x        Disease Management/Prevention x        Pain Management x  x      Medication Management x        Skin Care/Wound Management x  x      Splinting/Orthotics  x       Discharge Planning  x x x     Psychosocial Support    x                        Team Discharge Planning: Destination:  Home Projected Follow-up:  PT, OT and Home Health Projected Equipment Needs:  Bedside Commode, Tub Bench, Walker and Wheelchair Patient/family involved in discharge planning:  Yes  MD ELOS: 2 weeks Medical Rehab Prognosis:  Good Assessment: 66 yo male admitted for CHF exacerbation now requiring 24/7 rehab RN and MD, CIR level, PT and OT

## 2012-09-08 NOTE — Progress Notes (Signed)
Physical Therapy Treatment Patient Details Name: [name] MRN: [id number] DOB: 01-19-1946 Today's Date: 09/08/2012 Time: 6644-0347 PT Time Calculation (min): 28 min  PT Assessment / Plan / Recommendation Comments on Treatment Session  Pt admitted with CHF, AKI and cellulitis progressing well with mobility and highly motivated to ambulate. Pt complains of pain at hematoma sight limiting further ambulation after seated rest. HR 74-100 with activity and sats 94-97% on 2L although periods of dropping but inaccurate waveform. Pt encouraged to continue mobility and will continue to follow.     Follow Up Recommendations        Does the patient have the potential to tolerate intense rehabilitation     Barriers to Discharge        Equipment Recommendations       Recommendations for Other Services    Frequency     Plan Discharge plan remains appropriate;Frequency remains appropriate    Precautions / Restrictions Precautions Precautions: Fall Precaution Comments: oxygen Restrictions Weight Bearing Restrictions: No   Pertinent Vitals/Pain Pt reports soreness at hematoma    Mobility  Bed Mobility Bed Mobility: Not assessed Details for Bed Mobility Assistance: pt in recliner before and after tx Transfers Transfers: Sit to Stand;Stand to Sit Sit to Stand: From chair/3-in-1;With armrests;1: +2 Total assist Sit to Stand: Patient Percentage: 60% Stand to Sit: To chair/3-in-1;With armrests;3: Mod assist Details for Transfer Assistance: pt able to scoot forward and back in chair on his own and sequence transfer but required assist for anterior translation and pushing into standing with cueing and assist to control descent to surface Ambulation/Gait Ambulation/Gait Assistance: 4: Min assist Ambulation Distance (Feet): 30 Feet Assistive device: Rolling walker Ambulation/Gait Assistance Details: slow shuffle gait with cueing for sequence, RW use, posture and safety with turning Gait  Pattern: Trunk flexed;Shuffle Gait velocity: decreased    Exercises General Exercises - Lower Extremity Long Arc Quad: AROM;Both;Seated;20 reps Hip ABduction/ADduction: AROM;Both;20 reps;Seated Hip Flexion/Marching: AROM;Both;20 reps;Seated   PT Diagnosis:    PT Problem List:   PT Treatment Interventions:     PT Goals Acute Rehab PT Goals PT Goal: Sit to Stand - Progress: Progressing toward goal PT Goal: Stand to Sit - Progress: Progressing toward goal PT Goal: Ambulate - Progress: Progressing toward goal  Visit Information  Last PT Received On: 09/08/12 Assistance Needed: +2 (+2 for transfers only)    Subjective Data  Subjective: I'll be able to let it go when I stand up   Cognition  Overall Cognitive Status: Appears within functional limits for tasks assessed/performed Arousal/Alertness: Awake/alert Orientation Level: Appears intact for tasks assessed Behavior During Session: [facility] for tasks performed    Balance  Static Standing Balance Static Standing - Balance Support: Bilateral upper extremity supported Static Standing - Level of Assistance: 5: Stand by assistance Static Standing - Comment/# of Minutes: 2  End of Session PT - End of Session Equipment Utilized During Treatment: Gait belt Activity Tolerance: Patient tolerated treatment well Patient left: in chair;with call bell/phone within reach Nurse Communication: Mobility status   GP     Delorse Lek 09/08/2012, 10:15 AM Delaney Meigs, PT [phone]

## 2012-09-08 NOTE — Progress Notes (Signed)
ANTIBIOTIC CONSULT NOTE - INITIAL  Pharmacy Consult for vancomycin Indication: Lower extremity cellullitis  Allergies  Allergen Reactions  . Erythromycin Hives    Patient Measurements: Height: 5\' 9"  (175.3 cm) Weight: 264 lb (119.75 kg) IBW/kg (Calculated) : 70.7    Vital Signs: Temp: 97.9 F (36.6 C) (11/28 2345) Temp src: Oral (11/28 2345) BP: 128/62 mmHg (11/28 2110) Pulse Rate: 61  (11/28 2200) Intake/Output from previous day: 11/28 0701 - 11/29 0700 In: 1125 [P.O.:960; I.V.:165] Out: 1200 [Urine:1200] Intake/Output from this shift:    Labs:  Basename 09/08/12 0430 09/07/12 0500 09/06/12 0500  WBC 6.5 6.3 6.4  HGB 8.6* 8.1* 8.7*  PLT 145* 128* 135*  LABCREA -- -- --  CREATININE 1.58* 1.40* 1.17   Estimated Creatinine Clearance: 57.2 ml/min (by C-G formula based on Cr of 1.58). No results found for this basename: VANCOTROUGH:2,VANCOPEAK:2,VANCORANDOM:2,GENTTROUGH:2,GENTPEAK:2,GENTRANDOM:2,TOBRATROUGH:2,TOBRAPEAK:2,TOBRARND:2,AMIKACINPEAK:2,AMIKACINTROU:2,AMIKACIN:2, in the last 72 hours   Microbiology: Recent Results (from the past 720 hour(s))  URINE CULTURE     Status: Normal   Collection Time   09/04/12  1:15 PM      Component Value Range Status Comment   Specimen Description URINE, CLEAN CATCH   Final    Special Requests NONE   Final    Culture  Setup Time 09/04/2012 13:37   Final    Colony Count >=100,000 COLONIES/ML   Final    Culture PSEUDOMONAS AERUGINOSA   Final    Report Status 09/06/2012 FINAL   Final    Organism ID, Bacteria PSEUDOMONAS AERUGINOSA   Final   MRSA PCR SCREENING     Status: Normal   Collection Time   09/05/12  3:29 PM      Component Value Range Status Comment   MRSA by PCR NEGATIVE  NEGATIVE Final     Medical History: Past Medical History  Diagnosis Date  . CHRONIC OBSTRUCTIVE PULMONARY DISEASE 06/20/2009  . OBSTRUCTIVE SLEEP APNEA 06/20/2009  . CAROTID STENOSIS 06/20/2009    A. 08/2001 s/p L CEA;  B.   09/14/11 - Carotid U/S  - 40-59% bilateral stenosis, left CEA patch angioplasty is patent  . DM 06/20/2009  . CAD 06/20/2009    A.  08/2000 - s/p CABG x 4 - LIMA-LAD, Left Radial-OM, VG-DIAG, VG-RCA;  B. Neg. MV  2010  . HYPERLIPIDEMIA 06/20/2009  . HYPERTENSION 06/20/2009  . Hypothyroidism   . Low back pain   . Asthma     as child  . Pneumonia   . Atrial fibrillation     Not felt to be coumadin candidate 2/2 ETOH use.  Marland Kitchen Hypothyroidism   . ETOH abuse   . History of tobacco abuse     remote - quit 1970  . Bilateral renal cysts   . Marijuana abuse   . Morbidly obese   . CAD (coronary artery disease)   . CHF (congestive heart failure)   . Falls frequently     Medications:  Prescriptions prior to admission  Medication Sig Dispense Refill  . aspirin 325 MG tablet Take 325 mg by mouth daily.        Marland Kitchen FLUoxetine (PROZAC) 20 MG capsule Take 20 mg by mouth daily.        . fluticasone (FLOVENT HFA) 220 MCG/ACT inhaler Inhale 1 puff into the lungs 2 (two) times daily.        Marland Kitchen glipiZIDE (GLUCOTROL) 10 MG tablet Take 10 mg by mouth 2 (two) times daily before a meal.      . levothyroxine (SYNTHROID,  LEVOTHROID) 150 MCG tablet Take 150 mcg by mouth daily.        . magnesium oxide (MAG-OX) 400 MG tablet Take 400 mg by mouth 2 (two) times daily.      . Multiple Vitamin (MULTIVITAMIN) capsule Take 1 capsule by mouth daily.      Marland Kitchen omeprazole (PRILOSEC) 20 MG capsule Take 20 mg by mouth daily.        Marland Kitchen senna (SENOKOT) 8.6 MG tablet Take 1 tablet by mouth daily. As needed      . simvastatin (ZOCOR) 40 MG tablet Take 40 mg by mouth at bedtime.        . traZODone (DESYREL) 50 MG tablet Take 50-100 mg by mouth at bedtime as needed. sleep      . [DISCONTINUED] cephALEXin (KEFLEX) 500 MG capsule Take 500 mg by mouth 3 (three) times daily.       . [DISCONTINUED] furosemide (LASIX) 80 MG tablet Take 1 tablet (80 mg total) by mouth daily.  90 tablet  3  . [DISCONTINUED] gabapentin (NEURONTIN) 100 MG capsule Take 100-200 mg by  mouth 2 (two) times daily. Takes 1 tab in the morning and 2 at bedtime      . [DISCONTINUED] HYDROmorphone (DILAUDID) 2 MG tablet Take 2-4 mg by mouth every 4 (four) hours as needed. pain      . [DISCONTINUED] KLOR-CON M20 20 MEQ tablet 40 mEq daily.       . [DISCONTINUED] LORazepam (ATIVAN) 1 MG tablet Take 1 mg by mouth every 8 (eight) hours as needed. anxiety      . [DISCONTINUED] metoprolol (LOPRESSOR) 50 MG tablet Take 50 mg by mouth daily.       Assessment: 66 year old man hospitalized since 08/30/12 for CHF exacerbation to start on vancomycin IV for lower extremity cellulitis.  He is also receiving ciprofloxacin for UTI.  Goal of Therapy:  Vancomycin trough level 10-15 mcg/ml  Plan:  Expected duration 7 days with resolution of temperature and/or normalization of WBC Follow up culture results Will start with 2.5g IV x 1 dose, then 1.5g IV q24. Continue to follow renal function.  Mickeal Skinner 09/08/2012,8:18 AM

## 2012-09-08 NOTE — Progress Notes (Signed)
Admitting patient to CIR today. Visited patient and observed him ambulating in room with therapists. Patient is eager to come to inpatient rehab. Dr Isidoro Donning reports pt is medically stable. Called pt's daughter to inform her of plan. Pt's RN is aware of above. For questions call [phone].

## 2012-09-08 NOTE — Discharge Summary (Signed)
Physician Discharge Summary  [name] ZOX:[id number] DOB: December 15, 1945 DOA: 08/30/2012  PCP: [name], MD  Admit date: 08/30/2012 Discharge date: 09/08/2012  Recommendations for Outpatient Follow-up:  Followup with cardiology in 1-2 weeks after discharge. Discuss with cardiology about need for anticoagulation given atrial fibrillation as outpatient, once hematomas on the back are resolved.  Followup with GIBSON,DAVID, MD (PCP) in 1 week after discharge for eletrolytes, liver function test and CBC. Please discuss with your PCP about repeating imaging for soft tissue masses (from hematomas) to make sure the hematomas are resolving.  Discharge Diagnoses:  Principal Problem:  *Acute on chronic diastolic CHF (congestive heart failure) Active Problems: Cellulitis B/L extremities with peripheral edema  DM  HYPERLIPIDEMIA  HYPERTENSION  CHRONIC OBSTRUCTIVE PULMONARY DISEASE  CAD  ETOH abuse  Atrial fibrillation  Hematoma-on back, sacral area, right chest wall post fall  Acute renal failure  Weakness generalized  Hypothyroidism  Acute respiratory failure with hypoxia  Elevated troponin  Elevated LFTs  Pseudomonas urinary tract infection  Hypokalemia- resolved  L1 vertebral fracture  Discharge Condition: Stable  Diet recommendation: Heart healthy diet with 1.5 L of fluid restriction with 2 gm sodium diet.  Filed Weights   09/05/12 0904 09/06/12 0600 09/07/12 0352  Weight: 124.1 kg (273 lb 9.5 oz) 121.564 kg (268 lb) 119.75 kg (264 lb)    History of present illness:  66 yo male with history of chf, copd, etoh abuse, ckd, cafib, freq falls with recent large hematoma to flank area presented to [facility] with acute on chronic heart failure with cardiac hepatitis and cardiorenal syndrome and trop of 0.78. He was transferred for further management. He has been diuresed with lasix gtt and metolazone but has been transferred to stepdown for UF by Dr. [name], so far the patient  has not required ultrafiltration and has been diuresis to with Lasix drip. His transaminitis and AKI are resolving. Patient's AMS was likely medication-related in setting of AKI and sedating medications were minimized. He had asterixis on exam which improved with lactulose. Mentation markedly improved. Found to have pseudomonas UTI on 09/05/2012. Hematomas on back and left leg pain are chronic.   Hospital Course:  Diastolic heart failure, acute on chronic exacerbation, also severe pulmonary hypertension  Initial weight on admission was 126.1 kg (278 lbs), weight today was 119.75 kg (264 lbs). I/O negative -15.3 L to date. Initial troponin elevation likely due to heart strain as was the case in 09/2011. ECHO 11/20 demonstrated normal systolic function and EF of 55-60% with moderately dilated left atrium. The PA pressure was severely elevated with moderate TR and moderately dilated RA. Given mild bump in creatinine, Lasix gtt and metolazone discontinued (11/22-11/28), has not been started or needed UF yet. Cardiology transitioned the patient to oral torsemide. Current regimen includes torsemide 40 mg daily, spironolactone 25 mg daily, carvedilol 6.25 mg bid. Cardiology is following the patient and will continue to follow in CIR.   Cellulitis bilateral lower extremities: Patient is started on vancomycin IV per pharmacy today, monitor renal function, continue vancomycin at least for 48-72hrs depending on improvement in patient can be transitioned to oral doxycycline, and continue ciprofloxacin.    Pseudomonas UTI  Dysuria not improved with ceftriaxone and pyridium initially and was placed on zosyn. Culture growing Pseudomonas which is pansensitive. Transition from zosyn to ciprofloxacin on 09/07/2012. Antibiotics since 09/03/2012, define 7-10 day course of antibiotics.   AMS/Acute delirium, resolving  Likely medication side effect, but patient also had asterixis despite normal ammonia level.  TSH was mildly  elevated, check Free T4 normal at 0.91, some question about patient compliance, continued previous dose of synthroid. Patient's asterixis has resolved with lactulose. Vitamin B12 606. Vitamin B1 24, normal. RPR normal. Continue lactulose.  CAD s/p 4V CABG 2001, neg myoview 2010  Mild elevation in initial troponin, now normalized, likely due to acute on chronic CHF. Continue asa, statin and BB.  Atrial fibrillation, rate controlled  CHADS2 of 3, but not on warfarin/other anticoagulation due to alcohol use. Continue ASA and Carvedilol. Defer to cardiology for consideration of starting anticoagulation as outpatient. Also has traumatic hematomas on back, as a result would not be a candidate at this time.  Cardiorenal syndrome  Baseline creatinine 0.9, initially 1.9 and trending down with diuresis. Lasix and metolazone discontinued due to mild bump in creatinine.   Hypokalemia  KCL PO BID. Resolved with replacement. Magnesium normal.  Transaminitis  AST: ALT almost 2:1. May have been due to alcohol use yesterday or heart failure, improved with diuresis. Will need repeat as outpatient.   DM  Lantus 10 units and SSI during the hospital stay, hemoglobin A1C 5.8 on 08/30/2012, resume glipizide at discharge home.  Anemia, normocytic, chronic, but hemoglobin slightly lower than baseline.  Patient had recent fall with palpable hematoma on left flank with extensive bruising. Likely due to chronic alcohol use. Hgb stable around 8.8. Anemia panel not suggestive of iron deficiency anemia given serum iron is 51 on 09/07/2012.   Thrombocytopenia, mild.  Likely due to acute illness and alcohol use.   Hypothyroidism  TSH: 10.1. Free T4 0.91 on 09/07/2012. Continue synthroid at current dose - question compliance with medications. Defer to primary care physician for further management.   Multiple soft tissue masses back bilaterally / primarily along left flank hematoma and left leg (chronic) Percocet  as needed. Noncontrast CT of abdomen and pelvis shows interval decrease in size of previously described right sided presumed subcutaneous hematomas, development of new presumed hyperdense hematoma over the left mid back with subcutaneous tissue associated with acute/subacute L1 was transverse fracture. Likely will need repeat imaging with primary care physician at discharge.   New acute/subacute L1 transverse process fracture of CT of abd and pelvis  Discussed with Dr. Phoebe Perch, neurosurgery, on 09/07/2012 who reviewed the images and recommended conservative management. Patient does not need outpatient followup with neurosurgery.   Consultants:  Cardiology  Procedures:  PICC 11/24  2-D echocardiogram on 08/30/2012 Study Conclusions - Left ventricle: The cavity size was normal. Wall thickness was increased in a pattern of mild LVH. Systolic function was normal. The estimated ejection fraction was in the range of 55% to 60%. - Mitral valve: Calcified annulus. - Left atrium: The atrium was moderately dilated. - Right ventricle: The cavity size was moderately dilated. - Right atrium: The atrium was moderately dilated. - Tricuspid valve: Moderate regurgitation. - Pulmonary arteries: Systolic pressure was severely increased.  Antibiotics:  Ceftriaxone 11/24 >> 11/26  Zosyn 11/26 >>11/28  Cipro 11/28 Till 12/3 (to complete 10 day course). Vancomycin per pharmacy 11/29 >>  Discharge Exam: Filed Vitals:   09/07/12 2345 09/08/12 0545 09/08/12 0818 09/08/12 1007  BP:  118/58 113/48   Pulse:  67 75 89  Temp: 97.9 F (36.6 C) 98.4 F (36.9 C) 97.6 F (36.4 C)   TempSrc: Oral Oral Oral   Resp:      Height:      Weight:      SpO2:  100% 97% 94%   Discharge Instructions  Discharge Orders    Future Orders Please Complete By Expires   Diet - low sodium heart healthy      Comments:   1.5 L with 2 gm sodium diet.   Increase activity slowly      (HEART FAILURE PATIENTS) Call MD:   Anytime you have any of the following symptoms: 1) 3 pound weight gain in 24 hours or 5 pounds in 1 week 2) shortness of breath, with or without a dry hacking cough 3) swelling in the hands, feet or stomach 4) if you have to sleep on extra pillows at night in order to breathe.      Heart Failure patients record your daily weight using the same scale at the same time of day      Discharge instructions      Comments:   Followup with cardiology in 1-2 weeks after discharge. Discuss with cardiology about need for anticoagulation given atrial fibrillation as outpatient.  Followup with GIBSON,DAVID, MD (PCP) in 1 week after discharge for eletrolytes, liver function test and CBC. Please discuss with your PCP about repeating imaging for soft tissue masses (from hematomas) to make sure the hematomas are resolving.   Contraindication to ACEI at discharge      Comments:   EF greater than 40%, does not need ACE/ARB.       Medication List     As of 09/08/2012 10:51 AM    STOP taking these medications         cephALEXin 500 MG capsule   Commonly known as: KEFLEX      furosemide 80 MG tablet   Commonly known as: LASIX      HYDROmorphone 2 MG tablet   Commonly known as: DILAUDID      LORazepam 1 MG tablet   Commonly known as: ATIVAN      metoprolol 50 MG tablet   Commonly known as: LOPRESSOR      TAKE these medications         acetaminophen 325 MG tablet   Commonly known as: TYLENOL   Take 2 tablets (650 mg total) by mouth every 4 (four) hours as needed.      aspirin 325 MG tablet   Take 325 mg by mouth daily.      carvedilol 6.25 MG tablet   Commonly known as: COREG   Take 1 tablet (6.25 mg total) by mouth 2 (two) times daily with a meal.      ciprofloxacin 500 MG tablet   Commonly known as: CIPRO   Take 1 tablet (500 mg total) by mouth 2 (two) times daily. Discontinue after 09/12/2012.      FLUoxetine 20 MG capsule   Commonly known as: PROZAC   Take 20 mg by mouth daily.       fluticasone 220 MCG/ACT inhaler   Commonly known as: FLOVENT HFA   Inhale 1 puff into the lungs 2 (two) times daily.      folic acid 1 MG tablet   Commonly known as: FOLVITE   Take 1 tablet (1 mg total) by mouth daily.      gabapentin 300 MG capsule   Commonly known as: NEURONTIN   Take 1 capsule (300 mg total) by mouth daily.      glipiZIDE 10 MG tablet   Commonly known as: GLUCOTROL   Take 10 mg by mouth 2 (two) times daily before a meal.      lactulose 10 GM/15ML solution   Commonly known as: CHRONULAC  Take 45 mLs (30 g total) by mouth daily.      levothyroxine 150 MCG tablet   Commonly known as: SYNTHROID, LEVOTHROID   Take 150 mcg by mouth daily.      magnesium oxide 400 MG tablet   Commonly known as: MAG-OX   Take 400 mg by mouth 2 (two) times daily.      multivitamin capsule   Take 1 capsule by mouth daily.      omeprazole 20 MG capsule   Commonly known as: PRILOSEC   Take 20 mg by mouth daily.      oxyCODONE-acetaminophen 5-325 MG per tablet   Commonly known as: PERCOCET/ROXICET   Take 1-2 tablets by mouth every 6 (six) hours as needed.      polyethylene glycol packet   Commonly known as: MIRALAX / GLYCOLAX   Take 17 g by mouth daily as needed.      potassium chloride SA 20 MEQ tablet   Commonly known as: K-DUR,KLOR-CON   Take 2 tablets (40 mEq total) by mouth 2 (two) times daily.      senna 8.6 MG tablet   Commonly known as: SENOKOT   Take 1 tablet by mouth daily. As needed      simvastatin 40 MG tablet   Commonly known as: ZOCOR   Take 40 mg by mouth at bedtime.      spironolactone 25 MG tablet   Commonly known as: ALDACTONE   Take 1 tablet (25 mg total) by mouth daily.      torsemide 20 MG tablet   Commonly known as: DEMADEX   Take 2 tablets (40 mg total) by mouth daily.      traZODone 50 MG tablet   Commonly known as: DESYREL   Take 50-100 mg by mouth at bedtime as needed. sleep         Follow-up Information    Follow up with  GIBSON,DAVID, MD. Schedule an appointment as soon as possible for a visit in 1 week. (For labs and repeat imaging of your back to ensure hematomas are resolving.)    Contact information:   [address] [place] [address] [phone]       Follow up with [facility]). Schedule an appointment as soon as possible for a visit in 2 weeks.   Contact information:   [address], [address] [place] [address] [phone]          The results of significant diagnostics from this hospitalization (including imaging, microbiology, ancillary and laboratory) are listed below for reference.    Significant Diagnostic Studies: Ct Abdomen Pelvis Wo Contrast  09/07/2012  *RADIOLOGY REPORT*  Clinical Data: Subcutaneous masses on the patient's back  CT ABDOMEN AND PELVIS WITHOUT CONTRAST  Technique:  Multidetector CT imaging of the abdomen and pelvis was performed following the standard protocol without intravenous contrast.  Comparison: [facility]/2013[facility]  Findings: Again noted are multiple subcutaneous masses in the soft tissues of the back, of varying sizes and shapes. Dominant/ previously measured lesions are as follows:  Right mid posterior back, previously 5.3 cm, now 3.5 cm image 33.  Inferior aspect, right back/pelvis, previously 3.5 cm maximally now 2.5 cm in similar dimension image 69.  New area of inhomogeneous subcutaneous hyperdensity will lobulated appearance is noted over the left mid posterior back within the subcutaneous tissues and is incompletely included in the field of view due to streak artifact.  This measures at least  15.0 x 3.9 cm image 40.  Stranding in the anterior abdominal wall is again noted.  Right lower quadrant fat fluid containing ventral hernia again noted.  Bilateral lower lobe presumed compressive atelectasis.  Mild cardiomegaly again noted.  Extensive coronary arterial calcifications.  Unenhanced liver,  gallbladder, spleen, pancreas, and adrenal glands are normal.  Bilateral hypodense renal cortical lesions, some of which are large enough to characterize as cysts for example right mid kidney 4.1 cm cyst, are unchanged.  No new hydronephrosis or perinephric hematoma.  Extensive atherosclerotic aortic calcification without aneurysm.  No bowel wall thickening or focal segmental dilatation. Scattered colonic diverticuli noted without evidence for diverticulitis. Lumbar spine degenerative change is noted.  Left L1 transverse process fracture is new since the prior study.  IMPRESSION: Interval decrease in size of previously described right sided presumed subcutaneous hematomas given that previous history of trauma.  In the interval between the prior examination at [facility] 08/24/2012, there has been development of a new presumed hyperdense hematoma over the left mid back within the subcutaneous soft tissues with associated acute / subacute L1 transverse process fracture.   Original Report Authenticated By: Christiana Pellant, M.D.    Dg Chest 2 View  08/31/2012  *RADIOLOGY REPORT*  Clinical Data: Shortness of breath, recent fall with bruise over left posterior chest  CHEST - 2 VIEW  Comparison: Portable chest x-ray of 08/29/2012  Findings: There is cardiomegaly present with probable moderate pulmonary vascular congestion noted.  No effusion is seen.  Median sternotomy sutures are noted.  IMPRESSION: Cardiomegaly and probable moderate pulmonary vascular congestion.   Original Report Authenticated By: Dwyane Dee, M.D.    US Renal  08/30/2012  *RADIOLOGY REPORT*  Clinical Data:  Acute renal insufficiency.  Diabetes and hypertension.  RENAL/URINARY TRACT ULTRASOUND COMPLETE  Comparison:  07/27/2012  Findings:  Right Kidney:  Measures 13.6 cm.  Normal in size and parenchymal echogenicity.  No evidence of mass or hydronephrosis. Cyst within the upper pole of the right kidney measures 3.2 x 2.7 x 3.6 cm.  Left  Kidney:  Measures 14.5 cm.  Normal in size and parenchymal echogenicity.  No evidence of mass or hydronephrosis. There is a 2.6 x 2.0 x 2.2 cm cyst in the upper pole. A 3.3 x 2.75-0.4 cm cyst is noted in the inferior pole.  Bladder:  Not visualize secondary to postvoid state.  IMPRESSION:  1.  No obstructive uropathy. 2.  Bilateral renal cysts.   Original Report Authenticated By: Signa Kell, M.D.    Dg Chest Port 1 View  09/03/2012  *RADIOLOGY REPORT*  Clinical Data: PICC line placement.  PORTABLE CHEST - 1 VIEW  Comparison: 08/31/2012  Findings: Right upper extremity PICC line tip is in the lower SVC. Lungs show improved aeration since the prior chest x-ray with suggestion of potentially mild residual interstitial edema.  The heart size is stable.  IMPRESSION: PICC line tip is in the lower SVC.  Improved pulmonary edema.   Original Report Authenticated By: Irish Lack, M.D.     Microbiology: Recent Results (from the past 240 hour(s))  URINE CULTURE     Status: Normal   Collection Time   09/04/12  1:15 PM      Component Value Range Status Comment   Specimen Description URINE, CLEAN CATCH   Final    Special Requests NONE   Final    Culture  Setup Time 09/04/2012 13:37   Final    Colony Count >=100,000 COLONIES/ML   Final  Culture PSEUDOMONAS AERUGINOSA   Final    Report Status 09/06/2012 FINAL   Final    Organism ID, Bacteria PSEUDOMONAS AERUGINOSA   Final   MRSA PCR SCREENING     Status: Normal   Collection Time   09/05/12  3:29 PM      Component Value Range Status Comment   MRSA by PCR NEGATIVE  NEGATIVE Final      Labs: Basic Metabolic Panel:  Lab 09/08/12 4098 09/07/12 0500 09/06/12 0500 09/05/12 0500 09/04/12 0430  NA 138 137 136 136 139  K 3.8 3.5 3.1* 3.2* 3.1*  CL 91* 91* 88* 90* 91*  CO2 39* 39* 39* 39* 39*  GLUCOSE 151* 154* 189* 182* 162*  BUN 59* 57* 57* 59* 62*  CREATININE 1.58* 1.40* 1.17 1.13 1.18  CALCIUM 9.6 9.4 9.5 9.4 9.5  MG -- 1.5 -- -- --  PHOS --  -- -- -- --   Liver Function Tests:  Lab 09/02/12 0658  AST 68*  ALT 76*  ALKPHOS 79  BILITOT 0.8  PROT 6.7  ALBUMIN 2.8*   No results found for this basename: LIPASE:5,AMYLASE:5 in the last 168 hours  Lab 09/07/12 1215  AMMONIA 32   CBC:  Lab 09/08/12 0430 09/07/12 0500 09/06/12 0500 09/05/12 0500 09/04/12 0430  WBC 6.5 6.3 6.4 5.6 6.7  NEUTROABS -- -- -- -- --  HGB 8.6* 8.1* 8.7* 8.4* 8.7*  HCT 29.6* 28.0* 29.8* 29.4* 30.6*  MCV 88.1 87.5 89.2 88.6 87.7  PLT 145* 128* 135* 132* 150   Cardiac Enzymes: No results found for this basename: CKTOTAL:5,CKMB:5,CKMBINDEX:5,TROPONINI:5 in the last 168 hours BNP: BNP (last 3 results)  Basename 08/30/12 0416 10/13/11 0552 10/07/11 0407  PROBNP 18070.0* 5170.0* 14242.0*   CBG:  Lab 09/08/12 0820 09/07/12 2201 09/07/12 1714 09/07/12 1232 09/07/12 0743  GLUCAP 213* 217* 153* 264* 160*    Time spent:   Signed:  Kipper Buch  [facility] 09/08/2012, 10:51 AM

## 2012-09-08 NOTE — Progress Notes (Signed)
Transferred to 4144 by wheelchair, stable, report given to RN , belongings with pt.

## 2012-09-08 NOTE — Progress Notes (Signed)
Pt arrived to unit at 1520 via wheelchair. Pt given rehab notebook and reviewed process as well as safety plan. Pt initialed safety plan with verbal understanding. Belongings with pt. Answered all pt questions at this time. Call bell within reach and bed alarm on.

## 2012-09-08 NOTE — H&P (Signed)
Physical Medicine and Rehabilitation Admission H&P    No chief complaint on file. : HPI: [name] is a 66 y.o. right-handed male with history of COPD, atrial fibrillation but not a candidate for Coumadin secondary to history alcohol use, CAD with coronary artery bypass grafting 2001, left lower extremity chronic hematoma and chronic diastolic CHF. Admitted 08/30/2012 with altered mental status and oxygen saturations 85% on room air. Findings of elevated creatinine 1.93. Cardiac enzymes were unremarkable. Chest x-ray completed showing moderate pulmonary vascular congestion suspect CHF. Echocardiogram with ejection fraction of 60% and moderate RV dilatation, moderate TR. Patient placed on intravenous Lasix with followup cardiology services monitored closely for volume overload. His volume status greatly improved and Lasix was changed to Demadex. Subcutaneous heparin added for DVT prophylaxis. Hospital course Pseudomonas UTI maintained on Cipro. Patient with multiple soft tissue masses on his back bilaterally primarily along the left flank hematoma and left leg that was chronic in nature. Noncontrast CT of abdomen and pelvis showed interval decrease in size of previously described right sided presumed subcutaneous hematomas. Noted increasing cellulitic changes lower tremor to his questionable cellulitis intravenous vancomycin was initiated 09/08/2012 x7 days. There was incidental finding of new acute/subacute L1 transverse process fracture which was discussed with neurosurgery Dr. Phoebe Perch and advise conservative care and did not need outpatient followup with neurosurgery.  Noted patient fatigues easily with some confusion to his initial PT session. His strength and endurance continue to improve.  M.D. is requested physical medicine rehabilitation consult to consider inpatient rehabilitation services. Patient was felt to be a candidate for inpatient rehabilitation services and was admitted for comprehensive  rehabilitation program  Review of Systems  Respiratory: Positive for cough and shortness of breath.  Musculoskeletal: Positive for myalgias and falls.  Neurological: Positive for weakness.  Psychiatric/Behavioral: Positive for depression.  All other systems reviewed and are negative   Past Medical History  Diagnosis Date  . CHRONIC OBSTRUCTIVE PULMONARY DISEASE 06/20/2009  . OBSTRUCTIVE SLEEP APNEA 06/20/2009  . CAROTID STENOSIS 06/20/2009    A. 08/2001 s/p L CEA;  B.   09/14/11 - Carotid U/S - 40-59% bilateral stenosis, left CEA patch angioplasty is patent  . DM 06/20/2009  . CAD 06/20/2009    A.  08/2000 - s/p CABG x 4 - LIMA-LAD, Left Radial-OM, VG-DIAG, VG-RCA;  B. Neg. MV  2010  . HYPERLIPIDEMIA 06/20/2009  . HYPERTENSION 06/20/2009  . Hypothyroidism   . Low back pain   . Asthma     as child  . Pneumonia   . Atrial fibrillation     Not felt to be coumadin candidate 2/2 ETOH use.  Marland Kitchen Hypothyroidism   . ETOH abuse   . History of tobacco abuse     remote - quit 1970  . Bilateral renal cysts   . Marijuana abuse   . Morbidly obese   . CAD (coronary artery disease)   . CHF (congestive heart failure)   . Falls frequently    Past Surgical History  Procedure Date  . Carotid endarterectomy 2002    left  . Coronary artery bypass graft     x 4 - 2001   Family History  Problem Relation Age of Onset  . Stroke Mother     ?  . Stroke Father     ?   Social History:  reports that he quit smoking about 43 years ago. He does not have any smokeless tobacco history on file. He reports that he drinks alcohol. He reports  that he uses illicit drugs (Marijuana). Allergies:  Allergies  Allergen Reactions  . Erythromycin Hives   Medications Prior to Admission  Medication Sig Dispense Refill  . aspirin 325 MG tablet Take 325 mg by mouth daily.        Marland Kitchen FLUoxetine (PROZAC) 20 MG capsule Take 20 mg by mouth daily.        . fluticasone (FLOVENT HFA) 220 MCG/ACT inhaler Inhale 1 puff into  the lungs 2 (two) times daily.        Marland Kitchen glipiZIDE (GLUCOTROL) 10 MG tablet Take 10 mg by mouth 2 (two) times daily before a meal.      . levothyroxine (SYNTHROID, LEVOTHROID) 150 MCG tablet Take 150 mcg by mouth daily.        . magnesium oxide (MAG-OX) 400 MG tablet Take 400 mg by mouth 2 (two) times daily.      . Multiple Vitamin (MULTIVITAMIN) capsule Take 1 capsule by mouth daily.      Marland Kitchen omeprazole (PRILOSEC) 20 MG capsule Take 20 mg by mouth daily.        Marland Kitchen senna (SENOKOT) 8.6 MG tablet Take 1 tablet by mouth daily. As needed      . simvastatin (ZOCOR) 40 MG tablet Take 40 mg by mouth at bedtime.        . traZODone (DESYREL) 50 MG tablet Take 50-100 mg by mouth at bedtime as needed. sleep      . [DISCONTINUED] cephALEXin (KEFLEX) 500 MG capsule Take 500 mg by mouth 3 (three) times daily.       . [DISCONTINUED] furosemide (LASIX) 80 MG tablet Take 1 tablet (80 mg total) by mouth daily.  90 tablet  3  . [DISCONTINUED] gabapentin (NEURONTIN) 100 MG capsule Take 100-200 mg by mouth 2 (two) times daily. Takes 1 tab in the morning and 2 at bedtime      . [DISCONTINUED] HYDROmorphone (DILAUDID) 2 MG tablet Take 2-4 mg by mouth every 4 (four) hours as needed. pain      . [DISCONTINUED] KLOR-CON M20 20 MEQ tablet 40 mEq daily.       . [DISCONTINUED] LORazepam (ATIVAN) 1 MG tablet Take 1 mg by mouth every 8 (eight) hours as needed. anxiety      . [DISCONTINUED] metoprolol (LOPRESSOR) 50 MG tablet Take 50 mg by mouth daily.        Home: Home Living Lives With: Family;Daughter Available Help at Discharge: Family;Available PRN/intermittently (pt thinks he will have help at home but not certain) Type of Home: House Home Access: Stairs to enter [employer] of Steps: 3 Entrance Stairs-Rails: Can reach both Home Layout: One level Bathroom Shower/Tub: Other (comment) (not established due to lack of pt attention) Home Adaptive Equipment: Walker - rolling;Straight cane   Functional  History: Prior Function Able to Take Stairs?: No  Functional Status:  Mobility: Bed Mobility Bed Mobility: Sit to Supine Rolling Left: 3: Mod assist;With rail Left Sidelying to Sit: 3: Mod assist;With rails;HOB elevated Supine to Sit: 3: Mod assist;With rails;HOB elevated Sitting - Scoot to Edge of Bed: 3: Mod assist;With rail Sit to Supine: 1: +2 Total assist Sit to Supine: Patient Percentage: 50% Transfers Transfers: Sit to Stand;Stand to Sit;Stand Pivot Transfers Sit to Stand: 1: +2 Total assist;From chair/3-in-1 Sit to Stand: Patient Percentage: 60% Stand to Sit: 1: +2 Total assist;To bed Stand to Sit: Patient Percentage: 70% Stand Pivot Transfers: 1: +2 Total assist Stand Pivot Transfers: Patient Percentage: 60% Ambulation/Gait Ambulation/Gait Assistance: Not tested (comment)  Ambulation Distance (Feet): 25 Feet Assistive device: Rolling walker Ambulation/Gait Assistance Details: slow, shuffled, but with improved posture compared to 09/02/12.  vc for posture, standing close to RW and manipulation of RW/placement Gait Pattern: Step-to pattern;Decreased stride length;Decreased hip/knee flexion - right;Decreased hip/knee flexion - left;Decreased dorsiflexion - right;Decreased dorsiflexion - left;Shuffle;Trunk flexed Gait velocity: decr Stairs: No Wheelchair Mobility Wheelchair Mobility: No  ADL:    Cognition: Cognition Arousal/Alertness: Awake/alert Orientation Level: Oriented to person;Oriented to place Cognition Overall Cognitive Status: Appears within functional limits for tasks assessed/performed Area of Impairment:  (Much improved from 09/01/12) Arousal/Alertness: Awake/alert Orientation Level: Appears intact for tasks assessed Behavior During Session: [facility] for tasks performed Current Attention Level: Focused Memory:  (Decr recall of history and PLOF) Following Commands: Follows one step commands inconsistently Safety/Judgement: Decreased awareness of safety  precautions;Decreased awareness of need for assistance;Decreased safety judgement for tasks assessed Cognition - Other Comments: Confused throughout session; difficulty telling time on face clock, difficulty remember date, inconsistant report of PLOF/history   Blood pressure 128/62, pulse 61, temperature 97.9 F (36.6 C), temperature source Oral, resp. rate 18, height 5\' 9"  (1.753 m), weight 119.75 kg (264 lb), SpO2 100.00%. Physical Exam  Vitals reviewed.  Constitutional:  66 year old white male. NAD HENT:  Head: Normocephalic. Poor dentition Eyes:  Pupils round and reactive to light  Neck: Neck supple. No thyromegaly present.  Cardiovascular:  Cardiac rate controlled, irregularly irregular. No murmur.  Pulmonary/Chest:  Decreased breath sounds at the bases. Suboptimal air movement Abdominal: Soft. Bowel sounds are normal. He exhibits no distension.  Obese  Musculoskeletal: Could not visualize back hematoma secondary to patient sitting in chair with limited mobility  +2 edema lower extremities .venous changes lower extremities, limbs slightly sensitive to touch. mulltipe lesions and scales over both legs. Neurological: He is alert.  Patient was able to give his name and age. He did need subtle cues for date of birth. He followed three-step commands. He was a poor medical historian. UE 4/5. RLE is 3 to 3+. LLE is 1+ to 2 at HF and KE, 1 at ADF and APF. Decreased sensation in a stocking glove distribution in both legs to above knees. He also has sensory loss in either hands. Oriented to place and reason here, but quite distracted, difficult to keep on task,. Inconsistent with following directions. CN exam non-focal Psychiatric:  Flat affect    Results for orders placed during the hospital encounter of 08/30/12 (from the past 48 hour(s))  GLUCOSE, CAPILLARY     Status: Abnormal   Collection Time   09/06/12  8:05 AM      Component Value Range Comment   Glucose-Capillary 145 (*) 70 - 99  mg/dL   CARBOXYHEMOGLOBIN     Status: Abnormal   Collection Time   09/06/12  9:20 AM      Component Value Range Comment   Total hemoglobin 8.3 (*) 13.5 - 18.0 g/dL    O2 Saturation 16.1      Carboxyhemoglobin 2.4 (*) 0.5 - 1.5 %    Methemoglobin 0.9  0.0 - 1.5 %   GLUCOSE, CAPILLARY     Status: Abnormal   Collection Time   09/06/12 11:42 AM      Component Value Range Comment   Glucose-Capillary 167 (*) 70 - 99 mg/dL   GLUCOSE, CAPILLARY     Status: Abnormal   Collection Time   09/06/12  4:41 PM      Component Value Range Comment   Glucose-Capillary 156 (*) 70 -  99 mg/dL   GLUCOSE, CAPILLARY     Status: Abnormal   Collection Time   09/06/12 10:01 PM      Component Value Range Comment   Glucose-Capillary 201 (*) 70 - 99 mg/dL   CBC     Status: Abnormal   Collection Time   09/07/12  5:00 AM      Component Value Range Comment   WBC 6.3  4.0 - 10.5 K/uL    RBC 3.20 (*) 4.22 - 5.81 MIL/uL    Hemoglobin 8.1 (*) 13.0 - 17.0 g/dL    HCT 16.1 (*) 09.6 - 52.0 %    MCV 87.5  78.0 - 100.0 fL    MCH 25.3 (*) 26.0 - 34.0 pg    MCHC 28.9 (*) 30.0 - 36.0 g/dL    RDW 04.5 (*) 40.9 - 15.5 %    Platelets 128 (*) 150 - 400 K/uL   BASIC METABOLIC PANEL     Status: Abnormal   Collection Time   09/07/12  5:00 AM      Component Value Range Comment   Sodium 137  135 - 145 mEq/L    Potassium 3.5  3.5 - 5.1 mEq/L    Chloride 91 (*) 96 - 112 mEq/L    CO2 39 (*) 19 - 32 mEq/L    Glucose, Bld 154 (*) 70 - 99 mg/dL    BUN 57 (*) 6 - 23 mg/dL    Creatinine, Ser 8.11 (*) 0.50 - 1.35 mg/dL    Calcium 9.4  8.4 - 91.4 mg/dL    GFR calc non Af Amer 50 (*) >90 mL/min    GFR calc Af Amer 58 (*) >90 mL/min   T4, FREE     Status: Normal   Collection Time   09/07/12  5:00 AM      Component Value Range Comment   Free T4 0.91  0.80 - 1.80 ng/dL   VITAMIN N82     Status: Normal   Collection Time   09/07/12  5:00 AM      Component Value Range Comment   Vitamin B-12 548  211 - 911 pg/mL   IRON AND TIBC      Status: Abnormal   Collection Time   09/07/12  5:00 AM      Component Value Range Comment   Iron 51  42 - 135 ug/dL    TIBC 956  213 - 086 ug/dL    Saturation Ratios 14 (*) 20 - 55 %    UIBC 302  125 - 400 ug/dL   FERRITIN     Status: Normal   Collection Time   09/07/12  5:00 AM      Component Value Range Comment   Ferritin 44  22 - 322 ng/mL   RETICULOCYTES     Status: Abnormal   Collection Time   09/07/12  5:00 AM      Component Value Range Comment   Retic Ct Pct 3.7 (*) 0.4 - 3.1 %    RBC. 3.20 (*) 4.22 - 5.81 MIL/uL    Retic Count, Manual 118.4  19.0 - 186.0 K/uL   MAGNESIUM     Status: Normal   Collection Time   09/07/12  5:00 AM      Component Value Range Comment   Magnesium 1.5  1.5 - 2.5 mg/dL   CARBOXYHEMOGLOBIN     Status: Abnormal   Collection Time   09/07/12  5:29 AM      Component Value  Range Comment   Total hemoglobin 7.9 (*) 13.5 - 18.0 g/dL    O2 Saturation 40.9      Carboxyhemoglobin 2.6 (*) 0.5 - 1.5 %    Methemoglobin 1.0  0.0 - 1.5 %   GLUCOSE, CAPILLARY     Status: Abnormal   Collection Time   09/07/12  7:43 AM      Component Value Range Comment   Glucose-Capillary 160 (*) 70 - 99 mg/dL   AMMONIA     Status: Normal   Collection Time   09/07/12 12:15 PM      Component Value Range Comment   Ammonia 32  11 - 60 umol/L   GLUCOSE, CAPILLARY     Status: Abnormal   Collection Time   09/07/12 12:32 PM      Component Value Range Comment   Glucose-Capillary 264 (*) 70 - 99 mg/dL   GLUCOSE, CAPILLARY     Status: Abnormal   Collection Time   09/07/12  5:14 PM      Component Value Range Comment   Glucose-Capillary 153 (*) 70 - 99 mg/dL   GLUCOSE, CAPILLARY     Status: Abnormal   Collection Time   09/07/12 10:01 PM      Component Value Range Comment   Glucose-Capillary 217 (*) 70 - 99 mg/dL   CBC     Status: Abnormal   Collection Time   09/08/12  4:30 AM      Component Value Range Comment   WBC 6.5  4.0 - 10.5 K/uL    RBC 3.36 (*) 4.22 - 5.81  MIL/uL    Hemoglobin 8.6 (*) 13.0 - 17.0 g/dL    HCT 81.1 (*) 91.4 - 52.0 %    MCV 88.1  78.0 - 100.0 fL    MCH 25.6 (*) 26.0 - 34.0 pg    MCHC 29.1 (*) 30.0 - 36.0 g/dL    RDW 78.2 (*) 95.6 - 15.5 %    Platelets 145 (*) 150 - 400 K/uL   BASIC METABOLIC PANEL     Status: Abnormal   Collection Time   09/08/12  4:30 AM      Component Value Range Comment   Sodium 138  135 - 145 mEq/L    Potassium 3.8  3.5 - 5.1 mEq/L    Chloride 91 (*) 96 - 112 mEq/L    CO2 39 (*) 19 - 32 mEq/L    Glucose, Bld 151 (*) 70 - 99 mg/dL    BUN 59 (*) 6 - 23 mg/dL    Creatinine, Ser 2.13 (*) 0.50 - 1.35 mg/dL    Calcium 9.6  8.4 - 08.6 mg/dL    GFR calc non Af Amer 43 (*) >90 mL/min    GFR calc Af Amer 50 (*) >90 mL/min    Ct Abdomen Pelvis Wo Contrast  09/07/2012  *RADIOLOGY REPORT*  Clinical Data: Subcutaneous masses on the patient's back  CT ABDOMEN AND PELVIS WITHOUT CONTRAST  Technique:  Multidetector CT imaging of the abdomen and pelvis was performed following the standard protocol without intravenous contrast.  Comparison: [facility]  Findings: Again noted are multiple subcutaneous masses in the soft tissues of the back, of varying sizes and shapes. Dominant/ previously measured lesions are as follows:  Right mid posterior back, previously 5.3 cm, now 3.5 cm image 33.  Inferior aspect, right back/pelvis, previously 3.5 cm maximally now 2.5 cm in similar dimension image 69.  New area of inhomogeneous subcutaneous hyperdensity will lobulated appearance  is noted over the left mid posterior back within the subcutaneous tissues and is incompletely included in the field of view due to streak artifact.  This measures at least 15.0 x 3.9 cm image 40.  Stranding in the anterior abdominal wall is again noted.  Right lower quadrant fat fluid containing ventral hernia again noted.  Bilateral lower lobe presumed compressive atelectasis.  Mild cardiomegaly again noted.  Extensive coronary arterial  calcifications.  Unenhanced liver, gallbladder, spleen, pancreas, and adrenal glands are normal.  Bilateral hypodense renal cortical lesions, some of which are large enough to characterize as cysts for example right mid kidney 4.1 cm cyst, are unchanged.  No new hydronephrosis or perinephric hematoma.  Extensive atherosclerotic aortic calcification without aneurysm.  No bowel wall thickening or focal segmental dilatation. Scattered colonic diverticuli noted without evidence for diverticulitis. Lumbar spine degenerative change is noted.  Left L1 transverse process fracture is new since the prior study.  IMPRESSION: Interval decrease in size of previously described right sided presumed subcutaneous hematomas given that previous history of trauma.  In the interval between the prior examination at [facility] 08/24/2012, there has been development of a new presumed hyperdense hematoma over the left mid back within the subcutaneous soft tissues with associated acute / subacute L1 transverse process fracture.   Original Report Authenticated By: Christiana Pellant, M.D.     Post Admission Physician Evaluation: 1. Functional deficits secondary  to Deconditioning related to CHF and multi-medical issues. 2. Patient is admitted to receive collaborative, interdisciplinary care between the physiatrist, rehab nursing staff, and therapy team. 3. Patient's level of medical complexity and substantial therapy needs in context of that medical necessity cannot be provided at a lesser intensity of care such as a SNF. 4. Patient has experienced substantial functional loss from his/her baseline which was documented above under the "Functional History" and "Functional Status" headings.  Judging by the patient's diagnosis, physical exam, and functional history, the patient has potential for functional progress which will result in measurable gains while on inpatient rehab.  These gains will be of substantial and practical use upon  discharge  in facilitating mobility and self-care at the household level. 5. Physiatrist will provide 24 hour management of medical needs as well as oversight of the therapy plan/treatment and provide guidance as appropriate regarding the interaction of the two. 6. 24 hour rehab nursing will assist with bladder management, bowel management, safety, skin/wound care, disease management, medication administration, pain management and patient education  and help integrate therapy concepts, techniques,education, etc. 7. PT will assess and treat for:  Lower extremity strength, range of motion, stamina, balance, functional mobility, safety, adaptive techniques and equipment, education.  Goals are: Mod I to supervision. 8. OT will assess and treat for: ADL's, functional mobility, safety, upper extremity strength, adaptive techniques and equipment, .   Goals are: Mod I to supervision. 9. SLP will assess and treat for: cognition.  Goals are: supervision to mod I. 10. Case Management and Social Worker will assess and treat for psychological issues and discharge planning. 11. Team conference will be held weekly to assess progress toward goals and to determine barriers to discharge. 12. Patient will receive at least 3 hours of therapy per day at least 5 days per week. 13. ELOS: 10-12 days      Prognosis:  excellent   Medical Problem List and Plan: 1. Deconditioning related to CHF and multi-medical, toxic encephalopathy 2. DVT Prophylaxis/Anticoagulation: Subcutaneous heparin. Monitor platelet counts any signs of bleeding 3. Pain  Management: Neurontin 300 mg daily, Percocet as needed. Monitor mental status 4. Mood/delirium/depression. Prozac 20 mg daily. Provide emotional support and positive reinforcement 5. Neuropsych: This patient is capable of making decisions on his/her own behalf. 6. Hypertension/atrial fibrillation. Coreg 6.25 mg twice a day, Aldactone 25 mg daily, Demadex 40 mg daily. Monitor with  increased mobility. No plans for chronic anti-coagulation secondary to fall risk and history of alcohol use 7. Pseudomonas UTI. Continue Cipro x7 days 8. Subcutaneous masses on lower back and lower tremor the felt to be chronic in nature. We'll continue to provide conservative care 9. Question cellulitis lower extremities. Vancomycin initiate 09/08/2012 x7 days. Monitor skin closely 10. Diabetes mellitus. Hemoglobin A1c of 5.8. Lantus insulin 10 units daily. Check blood sugars a.c. and at bedtime 11. Incidental finding of L1 transverse process fracture acute versus subacute. Conservative care advised as per neurosurgery Dr. Phoebe Perch 12. Hypothyroidism. Synthroid 13. Hyperlipidemia. Zocor 14. Chronic renal insufficiency. Creatinine baseline 1.27-1.58. Followup chemistries 15. COPD. Check oxygen saturations every shift  Ivory Broad, MD 09/08/2012

## 2012-09-08 NOTE — Interval H&P Note (Signed)
[name] was admitted today to Inpatient Rehabilitation with the diagnosis of deconditioning after CHF, encephalopathy.  The patient's history has been reviewed, patient examined, and there is no change in status.  Patient continues to be appropriate for intensive inpatient rehabilitation.  I have reviewed the patient's chart and labs.  Questions were answered to the patient's satisfaction.  [name] T 09/08/2012, 5:33 PM

## 2012-09-08 NOTE — H&P (View-Only) (Signed)
Physical Medicine and Rehabilitation Admission H&P    No chief complaint on file. : HPI: [name] is a 66 y.o. right-handed male with history of COPD, atrial fibrillation but not a candidate for Coumadin secondary to history alcohol use, CAD with coronary artery bypass grafting 2001, left lower extremity chronic hematoma and chronic diastolic CHF. Admitted 08/30/2012 with altered mental status and oxygen saturations 85% on room air. Findings of elevated creatinine 1.93. Cardiac enzymes were unremarkable. Chest x-ray completed showing moderate pulmonary vascular congestion suspect CHF. Echocardiogram with ejection fraction of 60% and moderate RV dilatation, moderate TR. Patient placed on intravenous Lasix with followup cardiology services monitored closely for volume overload. His volume status greatly improved and Lasix was changed to Demadex. Subcutaneous heparin added for DVT prophylaxis. Hospital course Pseudomonas UTI maintained on Cipro. Patient with multiple soft tissue masses on his back bilaterally primarily along the left flank hematoma and left leg that was chronic in nature. Noncontrast CT of abdomen and pelvis showed interval decrease in size of previously described right sided presumed subcutaneous hematomas. Noted increasing cellulitic changes lower tremor to his questionable cellulitis intravenous vancomycin was initiated 09/08/2012 x7 days. There was incidental finding of new acute/subacute L1 transverse process fracture which was discussed with neurosurgery Dr. Hirsch and advise conservative care and did not need outpatient followup with neurosurgery.  Noted patient fatigues easily with some confusion to his initial PT session. His strength and endurance continue to improve.  M.D. is requested physical medicine rehabilitation consult to consider inpatient rehabilitation services. Patient was felt to be a candidate for inpatient rehabilitation services and was admitted for comprehensive  rehabilitation program  Review of Systems  Respiratory: Positive for cough and shortness of breath.  Musculoskeletal: Positive for myalgias and falls.  Neurological: Positive for weakness.  Psychiatric/Behavioral: Positive for depression.  All other systems reviewed and are negative   Past Medical History  Diagnosis Date  . CHRONIC OBSTRUCTIVE PULMONARY DISEASE 06/20/2009  . OBSTRUCTIVE SLEEP APNEA 06/20/2009  . CAROTID STENOSIS 06/20/2009    A. 08/2001 s/p L CEA;  B.   09/14/11 - Carotid U/S - 40-59% bilateral stenosis, left CEA patch angioplasty is patent  . DM 06/20/2009  . CAD 06/20/2009    A.  08/2000 - s/p CABG x 4 - LIMA-LAD, Left Radial-OM, VG-DIAG, VG-RCA;  B. Neg. MV  2010  . HYPERLIPIDEMIA 06/20/2009  . HYPERTENSION 06/20/2009  . Hypothyroidism   . Low back pain   . Asthma     as child  . Pneumonia   . Atrial fibrillation     Not felt to be coumadin candidate 2/2 ETOH use.  . Hypothyroidism   . ETOH abuse   . History of tobacco abuse     remote - quit 1970  . Bilateral renal cysts   . Marijuana abuse   . Morbidly obese   . CAD (coronary artery disease)   . CHF (congestive heart failure)   . Falls frequently    Past Surgical History  Procedure Date  . Carotid endarterectomy 2002    left  . Coronary artery bypass graft     x 4 - 2001   Family History  Problem Relation Age of Onset  . Stroke Mother     ?  . Stroke Father     ?   Social History:  reports that he quit smoking about 43 years ago. He does not have any smokeless tobacco history on file. He reports that he drinks alcohol. He reports   that he uses illicit drugs (Marijuana). Allergies:  Allergies  Allergen Reactions  . Erythromycin Hives   Medications Prior to Admission  Medication Sig Dispense Refill  . aspirin 325 MG tablet Take 325 mg by mouth daily.        . FLUoxetine (PROZAC) 20 MG capsule Take 20 mg by mouth daily.        . fluticasone (FLOVENT HFA) 220 MCG/ACT inhaler Inhale 1 puff into  the lungs 2 (two) times daily.        . glipiZIDE (GLUCOTROL) 10 MG tablet Take 10 mg by mouth 2 (two) times daily before a meal.      . levothyroxine (SYNTHROID, LEVOTHROID) 150 MCG tablet Take 150 mcg by mouth daily.        . magnesium oxide (MAG-OX) 400 MG tablet Take 400 mg by mouth 2 (two) times daily.      . Multiple Vitamin (MULTIVITAMIN) capsule Take 1 capsule by mouth daily.      . omeprazole (PRILOSEC) 20 MG capsule Take 20 mg by mouth daily.        . senna (SENOKOT) 8.6 MG tablet Take 1 tablet by mouth daily. As needed      . simvastatin (ZOCOR) 40 MG tablet Take 40 mg by mouth at bedtime.        . traZODone (DESYREL) 50 MG tablet Take 50-100 mg by mouth at bedtime as needed. sleep      . [DISCONTINUED] cephALEXin (KEFLEX) 500 MG capsule Take 500 mg by mouth 3 (three) times daily.       . [DISCONTINUED] furosemide (LASIX) 80 MG tablet Take 1 tablet (80 mg total) by mouth daily.  90 tablet  3  . [DISCONTINUED] gabapentin (NEURONTIN) 100 MG capsule Take 100-200 mg by mouth 2 (two) times daily. Takes 1 tab in the morning and 2 at bedtime      . [DISCONTINUED] HYDROmorphone (DILAUDID) 2 MG tablet Take 2-4 mg by mouth every 4 (four) hours as needed. pain      . [DISCONTINUED] KLOR-CON M20 20 MEQ tablet 40 mEq daily.       . [DISCONTINUED] LORazepam (ATIVAN) 1 MG tablet Take 1 mg by mouth every 8 (eight) hours as needed. anxiety      . [DISCONTINUED] metoprolol (LOPRESSOR) 50 MG tablet Take 50 mg by mouth daily.        Home: Home Living Lives With: Family;Daughter Available Help at Discharge: Family;Available PRN/intermittently (pt thinks he will have help at home but not certain) Type of Home: House Home Access: Stairs to enter Entrance Stairs-Number of Steps: 3 Entrance Stairs-Rails: Can reach both Home Layout: One level Bathroom Shower/Tub: Other (comment) (not established due to lack of pt attention) Home Adaptive Equipment: Walker - rolling;Straight cane   Functional  History: Prior Function Able to Take Stairs?: No  Functional Status:  Mobility: Bed Mobility Bed Mobility: Sit to Supine Rolling Left: 3: Mod assist;With rail Left Sidelying to Sit: 3: Mod assist;With rails;HOB elevated Supine to Sit: 3: Mod assist;With rails;HOB elevated Sitting - Scoot to Edge of Bed: 3: Mod assist;With rail Sit to Supine: 1: +2 Total assist Sit to Supine: Patient Percentage: 50% Transfers Transfers: Sit to Stand;Stand to Sit;Stand Pivot Transfers Sit to Stand: 1: +2 Total assist;From chair/3-in-1 Sit to Stand: Patient Percentage: 60% Stand to Sit: 1: +2 Total assist;To bed Stand to Sit: Patient Percentage: 70% Stand Pivot Transfers: 1: +2 Total assist Stand Pivot Transfers: Patient Percentage: 60% Ambulation/Gait Ambulation/Gait Assistance: Not tested (comment)   Ambulation Distance (Feet): 25 Feet Assistive device: Rolling walker Ambulation/Gait Assistance Details: slow, shuffled, but with improved posture compared to 09/02/12.  vc for posture, standing close to RW and manipulation of RW/placement Gait Pattern: Step-to pattern;Decreased stride length;Decreased hip/knee flexion - right;Decreased hip/knee flexion - left;Decreased dorsiflexion - right;Decreased dorsiflexion - left;Shuffle;Trunk flexed Gait velocity: decr Stairs: No Wheelchair Mobility Wheelchair Mobility: No  ADL:    Cognition: Cognition Arousal/Alertness: Awake/alert Orientation Level: Oriented to person;Oriented to place Cognition Overall Cognitive Status: Appears within functional limits for tasks assessed/performed Area of Impairment:  (Much improved from 09/01/12) Arousal/Alertness: Awake/alert Orientation Level: Appears intact for tasks assessed Behavior During Session: WFL for tasks performed Current Attention Level: Focused Memory:  (Decr recall of history and PLOF) Following Commands: Follows one step commands inconsistently Safety/Judgement: Decreased awareness of safety  precautions;Decreased awareness of need for assistance;Decreased safety judgement for tasks assessed Cognition - Other Comments: Confused throughout session; difficulty telling time on face clock, difficulty remember date, inconsistant report of PLOF/history   Blood pressure 128/62, pulse 61, temperature 97.9 F (36.6 C), temperature source Oral, resp. rate 18, height 5' 9" (1.753 m), weight 119.75 kg (264 lb), SpO2 100.00%. Physical Exam  Vitals reviewed.  Constitutional:  66-year-old white male. NAD HENT:  Head: Normocephalic. Poor dentition Eyes:  Pupils round and reactive to light  Neck: Neck supple. No thyromegaly present.  Cardiovascular:  Cardiac rate controlled, irregularly irregular. No murmur.  Pulmonary/Chest:  Decreased breath sounds at the bases. Suboptimal air movement Abdominal: Soft. Bowel sounds are normal. He exhibits no distension.  Obese  Musculoskeletal: Could not visualize back hematoma secondary to patient sitting in chair with limited mobility  +2 edema lower extremities .venous changes lower extremities, limbs slightly sensitive to touch. mulltipe lesions and scales over both legs. Neurological: He is alert.  Patient was able to give his name and age. He did need subtle cues for date of birth. He followed three-step commands. He was a poor medical historian. UE 4/5. RLE is 3 to 3+. LLE is 1+ to 2 at HF and KE, 1 at ADF and APF. Decreased sensation in a stocking glove distribution in both legs to above knees. He also has sensory loss in either hands. Oriented to place and reason here, but quite distracted, difficult to keep on task,. Inconsistent with following directions. CN exam non-focal Psychiatric:  Flat affect    Results for orders placed during the hospital encounter of 08/30/12 (from the past 48 hour(s))  GLUCOSE, CAPILLARY     Status: Abnormal   Collection Time   09/06/12  8:05 AM      Component Value Range Comment   Glucose-Capillary 145 (*) 70 - 99  mg/dL   CARBOXYHEMOGLOBIN     Status: Abnormal   Collection Time   09/06/12  9:20 AM      Component Value Range Comment   Total hemoglobin 8.3 (*) 13.5 - 18.0 g/dL    O2 Saturation 58.6      Carboxyhemoglobin 2.4 (*) 0.5 - 1.5 %    Methemoglobin 0.9  0.0 - 1.5 %   GLUCOSE, CAPILLARY     Status: Abnormal   Collection Time   09/06/12 11:42 AM      Component Value Range Comment   Glucose-Capillary 167 (*) 70 - 99 mg/dL   GLUCOSE, CAPILLARY     Status: Abnormal   Collection Time   09/06/12  4:41 PM      Component Value Range Comment   Glucose-Capillary 156 (*) 70 -   99 mg/dL   GLUCOSE, CAPILLARY     Status: Abnormal   Collection Time   09/06/12 10:01 PM      Component Value Range Comment   Glucose-Capillary 201 (*) 70 - 99 mg/dL   CBC     Status: Abnormal   Collection Time   09/07/12  5:00 AM      Component Value Range Comment   WBC 6.3  4.0 - 10.5 K/uL    RBC 3.20 (*) 4.22 - 5.81 MIL/uL    Hemoglobin 8.1 (*) 13.0 - 17.0 g/dL    HCT 28.0 (*) 39.0 - 52.0 %    MCV 87.5  78.0 - 100.0 fL    MCH 25.3 (*) 26.0 - 34.0 pg    MCHC 28.9 (*) 30.0 - 36.0 g/dL    RDW 19.6 (*) 11.5 - 15.5 %    Platelets 128 (*) 150 - 400 K/uL   BASIC METABOLIC PANEL     Status: Abnormal   Collection Time   09/07/12  5:00 AM      Component Value Range Comment   Sodium 137  135 - 145 mEq/L    Potassium 3.5  3.5 - 5.1 mEq/L    Chloride 91 (*) 96 - 112 mEq/L    CO2 39 (*) 19 - 32 mEq/L    Glucose, Bld 154 (*) 70 - 99 mg/dL    BUN 57 (*) 6 - 23 mg/dL    Creatinine, Ser 1.40 (*) 0.50 - 1.35 mg/dL    Calcium 9.4  8.4 - 10.5 mg/dL    GFR calc non Af Amer 50 (*) >90 mL/min    GFR calc Af Amer 58 (*) >90 mL/min   T4, FREE     Status: Normal   Collection Time   09/07/12  5:00 AM      Component Value Range Comment   Free T4 0.91  0.80 - 1.80 ng/dL   VITAMIN B12     Status: Normal   Collection Time   09/07/12  5:00 AM      Component Value Range Comment   Vitamin B-12 548  211 - 911 pg/mL   IRON AND TIBC      Status: Abnormal   Collection Time   09/07/12  5:00 AM      Component Value Range Comment   Iron 51  42 - 135 ug/dL    TIBC 353  215 - 435 ug/dL    Saturation Ratios 14 (*) 20 - 55 %    UIBC 302  125 - 400 ug/dL   FERRITIN     Status: Normal   Collection Time   09/07/12  5:00 AM      Component Value Range Comment   Ferritin 44  22 - 322 ng/mL   RETICULOCYTES     Status: Abnormal   Collection Time   09/07/12  5:00 AM      Component Value Range Comment   Retic Ct Pct 3.7 (*) 0.4 - 3.1 %    RBC. 3.20 (*) 4.22 - 5.81 MIL/uL    Retic Count, Manual 118.4  19.0 - 186.0 K/uL   MAGNESIUM     Status: Normal   Collection Time   09/07/12  5:00 AM      Component Value Range Comment   Magnesium 1.5  1.5 - 2.5 mg/dL   CARBOXYHEMOGLOBIN     Status: Abnormal   Collection Time   09/07/12  5:29 AM      Component Value   Range Comment   Total hemoglobin 7.9 (*) 13.5 - 18.0 g/dL    O2 Saturation 79.8      Carboxyhemoglobin 2.6 (*) 0.5 - 1.5 %    Methemoglobin 1.0  0.0 - 1.5 %   GLUCOSE, CAPILLARY     Status: Abnormal   Collection Time   09/07/12  7:43 AM      Component Value Range Comment   Glucose-Capillary 160 (*) 70 - 99 mg/dL   AMMONIA     Status: Normal   Collection Time   09/07/12 12:15 PM      Component Value Range Comment   Ammonia 32  11 - 60 umol/L   GLUCOSE, CAPILLARY     Status: Abnormal   Collection Time   09/07/12 12:32 PM      Component Value Range Comment   Glucose-Capillary 264 (*) 70 - 99 mg/dL   GLUCOSE, CAPILLARY     Status: Abnormal   Collection Time   09/07/12  5:14 PM      Component Value Range Comment   Glucose-Capillary 153 (*) 70 - 99 mg/dL   GLUCOSE, CAPILLARY     Status: Abnormal   Collection Time   09/07/12 10:01 PM      Component Value Range Comment   Glucose-Capillary 217 (*) 70 - 99 mg/dL   CBC     Status: Abnormal   Collection Time   09/08/12  4:30 AM      Component Value Range Comment   WBC 6.5  4.0 - 10.5 K/uL    RBC 3.36 (*) 4.22 - 5.81  MIL/uL    Hemoglobin 8.6 (*) 13.0 - 17.0 g/dL    HCT 29.6 (*) 39.0 - 52.0 %    MCV 88.1  78.0 - 100.0 fL    MCH 25.6 (*) 26.0 - 34.0 pg    MCHC 29.1 (*) 30.0 - 36.0 g/dL    RDW 19.7 (*) 11.5 - 15.5 %    Platelets 145 (*) 150 - 400 K/uL   BASIC METABOLIC PANEL     Status: Abnormal   Collection Time   09/08/12  4:30 AM      Component Value Range Comment   Sodium 138  135 - 145 mEq/L    Potassium 3.8  3.5 - 5.1 mEq/L    Chloride 91 (*) 96 - 112 mEq/L    CO2 39 (*) 19 - 32 mEq/L    Glucose, Bld 151 (*) 70 - 99 mg/dL    BUN 59 (*) 6 - 23 mg/dL    Creatinine, Ser 1.58 (*) 0.50 - 1.35 mg/dL    Calcium 9.6  8.4 - 10.5 mg/dL    GFR calc non Af Amer 43 (*) >90 mL/min    GFR calc Af Amer 50 (*) >90 mL/min    Ct Abdomen Pelvis Wo Contrast  09/07/2012  *RADIOLOGY REPORT*  Clinical Data: Subcutaneous masses on the patient's back  CT ABDOMEN AND PELVIS WITHOUT CONTRAST  Technique:  Multidetector CT imaging of the abdomen and pelvis was performed following the standard protocol without intravenous contrast.  Comparison: 11/14/2013CT at [facility]  Findings: Again noted are multiple subcutaneous masses in the soft tissues of the back, of varying sizes and shapes. Dominant/ previously measured lesions are as follows:  Right mid posterior back, previously 5.3 cm, now 3.5 cm image 33.  Inferior aspect, right back/pelvis, previously 3.5 cm maximally now 2.5 cm in similar dimension image 69.  New area of inhomogeneous subcutaneous hyperdensity will lobulated appearance   is noted over the left mid posterior back within the subcutaneous tissues and is incompletely included in the field of view due to streak artifact.  This measures at least 15.0 x 3.9 cm image 40.  Stranding in the anterior abdominal wall is again noted.  Right lower quadrant fat fluid containing ventral hernia again noted.  Bilateral lower lobe presumed compressive atelectasis.  Mild cardiomegaly again noted.  Extensive coronary arterial  calcifications.  Unenhanced liver, gallbladder, spleen, pancreas, and adrenal glands are normal.  Bilateral hypodense renal cortical lesions, some of which are large enough to characterize as cysts for example right mid kidney 4.1 cm cyst, are unchanged.  No new hydronephrosis or perinephric hematoma.  Extensive atherosclerotic aortic calcification without aneurysm.  No bowel wall thickening or focal segmental dilatation. Scattered colonic diverticuli noted without evidence for diverticulitis. Lumbar spine degenerative change is noted.  Left L1 transverse process fracture is new since the prior study.  IMPRESSION: Interval decrease in size of previously described right sided presumed subcutaneous hematomas given that previous history of trauma.  In the interval between the prior examination at [facility] 08/24/2012, there has been development of a new presumed hyperdense hematoma over the left mid back within the subcutaneous soft tissues with associated acute / subacute L1 transverse process fracture.   Original Report Authenticated By: Gretchen Green, M.D.     Post Admission Physician Evaluation: 1. Functional deficits secondary  to Deconditioning related to CHF and multi-medical issues. 2. Patient is admitted to receive collaborative, interdisciplinary care between the physiatrist, rehab nursing staff, and therapy team. 3. Patient's level of medical complexity and substantial therapy needs in context of that medical necessity cannot be provided at a lesser intensity of care such as a SNF. 4. Patient has experienced substantial functional loss from his/her baseline which was documented above under the "Functional History" and "Functional Status" headings.  Judging by the patient's diagnosis, physical exam, and functional history, the patient has potential for functional progress which will result in measurable gains while on inpatient rehab.  These gains will be of substantial and practical use upon  discharge  in facilitating mobility and self-care at the household level. 5. Physiatrist will provide 24 hour management of medical needs as well as oversight of the therapy plan/treatment and provide guidance as appropriate regarding the interaction of the two. 6. 24 hour rehab nursing will assist with bladder management, bowel management, safety, skin/wound care, disease management, medication administration, pain management and patient education  and help integrate therapy concepts, techniques,education, etc. 7. PT will assess and treat for:  Lower extremity strength, range of motion, stamina, balance, functional mobility, safety, adaptive techniques and equipment, education.  Goals are: Mod I to supervision. 8. OT will assess and treat for: ADL's, functional mobility, safety, upper extremity strength, adaptive techniques and equipment, .   Goals are: Mod I to supervision. 9. SLP will assess and treat for: cognition.  Goals are: supervision to mod I. 10. Case Management and Social Worker will assess and treat for psychological issues and discharge planning. 11. Team conference will be held weekly to assess progress toward goals and to determine barriers to discharge. 12. Patient will receive at least 3 hours of therapy per day at least 5 days per week. 13. ELOS: 10-12 days      Prognosis:  excellent   Medical Problem List and Plan: 1. Deconditioning related to CHF and multi-medical, toxic encephalopathy 2. DVT Prophylaxis/Anticoagulation: Subcutaneous heparin. Monitor platelet counts any signs of bleeding 3. Pain   Management: Neurontin 300 mg daily, Percocet as needed. Monitor mental status 4. Mood/delirium/depression. Prozac 20 mg daily. Provide emotional support and positive reinforcement 5. Neuropsych: This patient is capable of making decisions on his/her own behalf. 6. Hypertension/atrial fibrillation. Coreg 6.25 mg twice a day, Aldactone 25 mg daily, Demadex 40 mg daily. Monitor with  increased mobility. No plans for chronic anti-coagulation secondary to fall risk and history of alcohol use 7. Pseudomonas UTI. Continue Cipro x7 days 8. Subcutaneous masses on lower back and lower tremor the felt to be chronic in nature. We'll continue to provide conservative care 9. Question cellulitis lower extremities. Vancomycin initiate 09/08/2012 x7 days. Monitor skin closely 10. Diabetes mellitus. Hemoglobin A1c of 5.8. Lantus insulin 10 units daily. Check blood sugars a.c. and at bedtime 11. Incidental finding of L1 transverse process fracture acute versus subacute. Conservative care advised as per neurosurgery Dr. Hirsch 12. Hypothyroidism. Synthroid 13. Hyperlipidemia. Zocor 14. Chronic renal insufficiency. Creatinine baseline 1.27-1.58. Followup chemistries 15. COPD. Check oxygen saturations every shift  Zach Woodson Macha, MD 09/08/2012  

## 2012-09-09 ENCOUNTER — Inpatient Hospital Stay (HOSPITAL_COMMUNITY): Payer: Medicare Other | Admitting: *Deleted

## 2012-09-09 ENCOUNTER — Inpatient Hospital Stay (HOSPITAL_COMMUNITY): Payer: Medicare Other

## 2012-09-09 ENCOUNTER — Inpatient Hospital Stay (HOSPITAL_COMMUNITY): Payer: Medicare Other | Admitting: Occupational Therapy

## 2012-09-09 LAB — CBC WITH DIFFERENTIAL/PLATELET
Basophils Absolute: 0 10*3/uL (ref 0.0–0.1)
Basophils Relative: 0 % (ref 0–1)
Eosinophils Absolute: 0.3 10*3/uL (ref 0.0–0.7)
Eosinophils Relative: 5 % (ref 0–5)
MCH: 25.6 pg — ABNORMAL LOW (ref 26.0–34.0)
MCV: 88.5 fL (ref 78.0–100.0)
Platelets: 132 10*3/uL — ABNORMAL LOW (ref 150–400)
RDW: 20.1 % — ABNORMAL HIGH (ref 11.5–15.5)

## 2012-09-09 LAB — COMPREHENSIVE METABOLIC PANEL
ALT: 13 U/L (ref 0–53)
AST: 16 U/L (ref 0–37)
Calcium: 9.3 mg/dL (ref 8.4–10.5)
GFR calc Af Amer: 51 mL/min — ABNORMAL LOW (ref >90)
Sodium: 136 mEq/L (ref 135–145)
Total Protein: 6.8 g/dL (ref 6.0–8.3)

## 2012-09-09 LAB — GLUCOSE, CAPILLARY: Glucose-Capillary: 134 mg/dL — ABNORMAL HIGH (ref 70–99)

## 2012-09-09 MED ORDER — SODIUM CHLORIDE 0.9 % IJ SOLN
10.0000 mL | INTRAMUSCULAR | Status: DC | PRN
  Administered 2012-09-09: 10 mL
  Administered 2012-09-09: 30 mL
  Administered 2012-09-09 (×2): 10 mL
  Administered 2012-09-10: 40 mL
  Administered 2012-09-10 – 2012-09-11 (×2): 30 mL
  Administered 2012-09-11: 10 mL
  Administered 2012-09-12 (×3): 30 mL
  Administered 2012-09-14 (×2): 10 mL
  Administered 2012-09-14 (×2): 20 mL
  Administered 2012-09-15: 30 mL
  Administered 2012-09-15 – 2012-09-18 (×16): 10 mL
  Administered 2012-09-18: 30 mL
  Administered 2012-09-19 – 2012-09-20 (×3): 10 mL
  Administered 2012-09-20 – 2012-09-21 (×2): 30 mL

## 2012-09-09 NOTE — Evaluation (Signed)
Speech Language Pathology Assessment and Plan  Patient Details  Name: [name] MRN: [id number] Date of Birth: Jul 17, 1946  SLP Diagnosis: Cognitive Impairments  Rehab Potential: Good ELOS: 7 days   Today's Date: 09/09/2012 Time: 1230-1300 Time Calculation (min): 30 min  Skilled Therapeutic Intervention: Administered cognitive-linguistic evaluation. Please see below for details.   Problem List:  Patient Active Problem List  Diagnosis  . DM  . HYPERLIPIDEMIA  . OBSTRUCTIVE SLEEP APNEA  . HYPERTENSION  . DIASTOLIC HEART FAILURE, CHRONIC  . CAROTID STENOSIS  . CHRONIC OBSTRUCTIVE PULMONARY DISEASE  . EDEMA  . Dyspnea  . CAD  . ETOH abuse  . Atrial fibrillation  . Renal insufficiency  . NSTEMI (non-ST elevated myocardial infarction)  . Hematoma-on back, sacral area, right chest wall post fall  . Acute renal failure  . Weakness generalized  . Hypothyroidism  . Acute respiratory failure with hypoxia  . Elevated troponin  . Elevated LFTs  . Acute on chronic diastolic CHF (congestive heart failure)  . Pseudomonas urinary tract infection  . Hypokalemia  . L1 vertebral fracture   Past Medical History:  Past Medical History  Diagnosis Date  . CHRONIC OBSTRUCTIVE PULMONARY DISEASE 06/20/2009  . OBSTRUCTIVE SLEEP APNEA 06/20/2009  . CAROTID STENOSIS 06/20/2009    A. 08/2001 s/p L CEA;  B.   09/14/11 - Carotid U/S - 40-59% bilateral stenosis, left CEA patch angioplasty is patent  . DM 06/20/2009  . CAD 06/20/2009    A.  08/2000 - s/p CABG x 4 - LIMA-LAD, Left Radial-OM, VG-DIAG, VG-RCA;  B. Neg. MV  2010  . HYPERLIPIDEMIA 06/20/2009  . HYPERTENSION 06/20/2009  . Hypothyroidism   . Low back pain   . Asthma     as child  . Pneumonia   . Atrial fibrillation     Not felt to be coumadin candidate 2/2 ETOH use.  Marland Kitchen Hypothyroidism   . ETOH abuse   . History of tobacco abuse     remote - quit 1970  . Bilateral renal cysts   . Marijuana abuse   . Morbidly obese   . CAD  (coronary artery disease)   . CHF (congestive heart failure)   . Falls frequently    Past Surgical History:  Past Surgical History  Procedure Date  . Carotid endarterectomy 2002    left  . Coronary artery bypass graft     x 4 - 2001    Assessment / Plan / Recommendation Clinical Impression  Patient is a 66 y.o. year old male with recent admission to the hospital on 08/30/12 with altered mental status and oxygen saturations 85% on room air. Findings of elevated creatinine 1.93. Cardiac enzymes were unremarkable. Chest x-ray completed showing moderate pulmonary vascular congestion suspect CHF. Echocardiogram with ejection fraction of 60% and moderate RV dilatation, moderate TR. Patient placed on intravenous Lasix with followup cardiology services monitored closely for volume overload. Patient transferred to CIR on 09/08/2012 and presents with mild cognitive impairments characterized by decreased mental flexibility and problem solving.  Unable to determine baseline cognition at this time due to no family present. Clinician to f/u X 1 session for continued assessment of cognitive function and to determine if skilled SLP intervention is needed at this time.    SLP Assessment  Patient will need skilled Speech Lanaguage Pathology Services during CIR admission    Recommendations  Follow up Recommendations:  (TBD) Equipment Recommended: None recommended by SLP    SLP Frequency 1-2 X/day, 30-60 minutes  SLP Treatment/Interventions Cognitive remediation/compensation;Cueing hierarchy;Environmental controls;Functional tasks;Internal/external aids;Patient/family education;Therapeutic Activities    Pain Pain Assessment Pain Assessment: 0-10 Pain Score:   3 Pain Type: Chronic pain Pain Location: Leg Pain Orientation: Left Pain Descriptors: Aching;Numbness Pain Onset: On-going Pain Intervention(s): Medication (See eMAR) Prior Functioning Type of Home: House Lives With:  Family;Daughter Available Help at Discharge: Family  Short Term Goals: Week 1: SLP Short Term Goal 1 (Week 1): Pt will participate in continued diagnostic treatment of cognitive function   See FIM for current functional status Refer to Care Plan for Long Term Goals  Recommendations for other services: None  Discharge Criteria: Patient will be discharged from SLP if patient refuses treatment 3 consecutive times without medical reason, if treatment goals not met, if there is a change in medical status, if patient makes no progress towards goals or if patient is discharged from hospital.  The above assessment, treatment plan, treatment alternatives and goals were discussed and mutually agreed upon: by patient  Eland Lamantia 09/09/2012, 3:36 PM

## 2012-09-09 NOTE — Progress Notes (Signed)
Subjective/Complaints: Feels fatigued but no specific pain No specific complaints in ROS  Objective: Vital Signs: Blood pressure 117/69, pulse 82, temperature 99.3 F (37.4 C), temperature source Oral, resp. rate 18, weight 274 lb 14.6 oz (124.7 kg), SpO2 95.00%.  Exam:  NAD Morbidly obese Chest-cta Cv_irreg rate abd- obese, soft, nontender extr- 2+ edema in bilateral LE- hyperpigmentation and thickening of skin (consistent with venous insuff) A/P Medical Problem List and Plan: 1. Deconditioning related to CHF and multi-medical, toxic encephalopathy 2. DVT Prophylaxis/Anticoagulation: Subcutaneous heparin. Monitor platelet counts any signs of bleeding 3. Pain Management: Neurontin 300 mg daily, Percocet as needed. Monitor mental status 4. Mood/delirium/depression. Prozac 20 mg daily. Provide emotional support and positive reinforcement 5. Neuropsych: This patient is capable of making decisions on his/her own behalf. 6. Hypertension/atrial fibrillation. Reviewed meds Not a warfarin candidate 7. Pseudomonas UTI. Continue Cipro for total of 7 days 8. Subcutaneous masses on lower back and lower tremor the felt to be chronic in nature.  9. Question cellulitis lower extremities. Vancomycin initiate 09/08/2012 x7 days. Monitor skin closely (no new break down) 10. Diabetes mellitus. Hemoglobin A1c of 5.8. Lantus insulin 10 units daily. Check blood sugars a.c. and at bedtime 11. Incidental finding of L1 transverse process fracture acute versus subacute. Conservative care advised as per neurosurgery Dr. Phoebe Perch 12. Hypothyroidism. Synthroid 13. Hyperlipidemia. Zocor 14. Chronic renal insufficiency.  Basic Metabolic Panel:    Component Value Date/Time   NA 136 09/09/2012 0620   K 3.6 09/09/2012 0620   CL 91* 09/09/2012 0620   CO2 35* 09/09/2012 0620   BUN 58* 09/09/2012 0620   CREATININE 1.57* 09/09/2012 0620   GLUCOSE 127* 09/09/2012 0620   CALCIUM 9.3 09/09/2012 0620    15. COPD.  -O2 sats are stable SpO2 Readings from Last 3 Encounters:  09/09/12 95%  09/08/12 100%  10/16/11 97%         LOS (Days) 1 A FACE TO FACE EVALUATION WAS PERFORMED  Roger Kettles HENRY 09/09/2012 5:48 AM

## 2012-09-09 NOTE — Evaluation (Signed)
Occupational Therapy Assessment and Plan  Patient Details  Name: [name] MRN: [id number] Date of Birth: Mar 18, 1946  OT Diagnosis: acute pain, cognitive deficits, muscle weakness (generalized) and swelling of limb Rehab Potential: Rehab Potential: Fair ELOS: 7 days   Today's Date: 09/09/2012 Time: 0705-0810 Time Calculation (min): 65 min  Problem List:  Patient Active Problem List  Diagnosis  . DM  . HYPERLIPIDEMIA  . OBSTRUCTIVE SLEEP APNEA  . HYPERTENSION  . DIASTOLIC HEART FAILURE, CHRONIC  . CAROTID STENOSIS  . CHRONIC OBSTRUCTIVE PULMONARY DISEASE  . EDEMA  . Dyspnea  . CAD  . ETOH abuse  . Atrial fibrillation  . Renal insufficiency  . NSTEMI (non-ST elevated myocardial infarction)  . Hematoma-on back, sacral area, right chest wall post fall  . Acute renal failure  . Weakness generalized  . Hypothyroidism  . Acute respiratory failure with hypoxia  . Elevated troponin  . Elevated LFTs  . Acute on chronic diastolic CHF (congestive heart failure)  . Pseudomonas urinary tract infection  . Hypokalemia  . L1 vertebral fracture    Past Medical History:  Past Medical History  Diagnosis Date  . CHRONIC OBSTRUCTIVE PULMONARY DISEASE 06/20/2009  . OBSTRUCTIVE SLEEP APNEA 06/20/2009  . CAROTID STENOSIS 06/20/2009    A. 08/2001 s/p L CEA;  B.   09/14/11 - Carotid U/S - 40-59% bilateral stenosis, left CEA patch angioplasty is patent  . DM 06/20/2009  . CAD 06/20/2009    A.  08/2000 - s/p CABG x 4 - LIMA-LAD, Left Radial-OM, VG-DIAG, VG-RCA;  B. Neg. MV  2010  . HYPERLIPIDEMIA 06/20/2009  . HYPERTENSION 06/20/2009  . Hypothyroidism   . Low back pain   . Asthma     as child  . Pneumonia   . Atrial fibrillation     Not felt to be coumadin candidate 2/2 ETOH use.  Marland Kitchen Hypothyroidism   . ETOH abuse   . History of tobacco abuse     remote - quit 1970  . Bilateral renal cysts   . Marijuana abuse   . Morbidly obese   . CAD (coronary artery disease)   . CHF  (congestive heart failure)   . Falls frequently    Past Surgical History:  Past Surgical History  Procedure Date  . Carotid endarterectomy 2002    left  . Coronary artery bypass graft     x 4 - 2001    Assessment & Plan Clinical Impression: Patient is a 66 y.o. year old male with recent admission to the hospital on 08/30/12 with altered mental status and oxygen saturations 85% on room air. Findings of elevated creatinine 1.93. Cardiac enzymes were unremarkable. Chest x-ray completed showing moderate pulmonary vascular congestion suspect CHF. Echocardiogram with ejection fraction of 60% and moderate RV dilatation, moderate TR. Patient placed on intravenous Lasix with followup cardiology services monitored closely for volume overload.  Patient transferred to CIR on 09/08/2012 .    Patient currently requires max with basic self-care skills secondary to muscle weakness and decreased cardiorespiratoy endurance.  Prior to hospitalization, patient could complete basic self care skills with min.  Patient will benefit from skilled intervention to decrease level of assist with basic self-care skills prior to discharge home with care partner.  Anticipate patient will require minimal physical assistance and follow up home health.  OT - End of Session Activity Tolerance: Tolerates 10 - 20 min activity with multiple rests Endurance Deficit: Yes Endurance Deficit Description: Patient reports feeling fatigued with minimal activity OT  Assessment Rehab Potential: Fair Barriers to Discharge: None OT Plan OT Frequency: 1-2 X/day, 60-90 minutes Estimated Length of Stay: 7 days OT Treatment/Interventions: Balance/vestibular training;Discharge planning;DME/adaptive equipment instruction;Functional mobility training;Pain management;Patient/family education;Self Care/advanced ADL retraining;Therapeutic Activities;UE/LE Strength taining/ROM OT Recommendation Follow Up Recommendations: Home health OT Equipment  Recommended: 3 in 1 bedside comode;Tub/shower seat  OT Evaluation Precautions/Restrictions  Precautions Precautions: Fall Restrictions Weight Bearing Restrictions: No General Chart Reviewed: Yes Vital Signs Therapy Vitals Temp: 94.8 F (34.9 C) Temp src: Axillary Pulse Rate: 64  Resp: 18  BP: 109/69 mmHg Patient Position, if appropriate: Sitting Oxygen Therapy SpO2: 98 % O2 Device: Nasal cannula O2 Flow Rate (L/min): 2 L/min Pain Pain Assessment Pain Assessment: 0-10 Pain Score:   3 Pain Type: Chronic pain Pain Location: Leg Pain Orientation: Left Pain Descriptors: Aching;Numbness Pain Onset: On-going Pain Intervention(s): Medication (See eMAR) Home Living/Prior Functioning Home Living Lives With: Family;Daughter Available Help at Discharge: Family Type of Home: House Home Adaptive Equipment: Walker - rolling IADL History Occupation: Retired Prior Function Level of Independence: Needs assistance with ADLs;Needs assistance with homemaking;Needs assistance with gait;Needs assistance with tranfers ADL   Vision/Perception  Vision - History Baseline Vision: Wears glasses all the time Patient Visual Report: No change from baseline Vision - Assessment Eye Alignment: Within Functional Limits  Cognition Overall Cognitive Status: Impaired at baseline (limited mental flexibility, limited insight) Arousal/Alertness: Lethargic (difficult to arouse initially) Orientation Level: Oriented X4 Attention: Selective Selective Attention: Appears intact Awareness: Appears intact (aware of deficits) Problem Solving: Appears intact (directs others to solve problems,  little attempt on his own) Safety/Judgment: Appears intact Sensation Sensation Light Touch: Impaired by gross assessment Stereognosis: Not tested Additional Comments: Neuropathy.  Difficulty maintaining grasp on items Coordination Gross Motor Movements are Fluid and Coordinated: No Fine Motor Movements are  Fluid and Coordinated: No Motor  Motor Motor - Skilled Clinical Observations: limited functional use of left upper extremity during ADL Mobility     Trunk/Postural Assessment  Cervical Assessment Cervical Assessment: Within Functional Limits Thoracic Assessment Thoracic Assessment: Within Functional Limits Lumbar Assessment Lumbar Assessment: Within Functional Limits (posterior orientation of pelvis) Postural Control Postural Control: Within Functional Limits  Balance Balance Balance Assessed: Yes Static Standing Balance Static Standing - Balance Support: Bilateral upper extremity supported Static Standing - Level of Assistance: 4: Min assist Static Standing - Comment/# of Minutes: max assist to transition to standing.  Anxious without UE support.   Extremity/Trunk Assessment RUE Assessment RUE Assessment: Within Functional Limits LUE Assessment LUE Assessment: Exceptions to [facility] (report of neuropathy, does not spontaneously use functionall)  See FIM for current functional status Refer to Care Plan for Long Term Goals  Recommendations for other services: None  Discharge Criteria: Patient will be discharged from OT if patient refuses treatment 3 consecutive times without medical reason, if treatment goals not met, if there is a change in medical status, if patient makes no progress towards goals or if patient is discharged from hospital.  The above assessment, treatment plan, treatment alternatives and goals were discussed and mutually agreed upon: by patient  Collier Salina 09/09/2012, 3:11 PM

## 2012-09-09 NOTE — Progress Notes (Signed)
Occupational Therapy Note  Patient Details  Name: [name] MRN: [id number] Date of Birth: 1946-05-02 Today's Date: 09/09/2012  Pain:  5/10 left leg.  See MAR 1400-1430  (30 min) Individual therapy On 2 liters of Oxygen.  Ox= 98% at rest.  Addressed walker mobility around room to bathroom, regular chair, out in hall way and recliner.  Pt. Stood with slight posterior bias but provided multiple cues for pt to hold through abdomen and buttocks to maintain balance.  Pt maintained oxygen in nineties throughout session.    Oxygen= 98% at end.  Humberto Seals 09/09/2012, 2:04 PM

## 2012-09-09 NOTE — Evaluation (Signed)
Physical Therapy Assessment and Plan  Patient Details  Name: [name] MRN: [id number] Date of Birth: Dec 24, 1945  PT Diagnosis: Difficulty walking, Edema, Impaired sensation and Muscle weakness Rehab Potential: good   ELOS: 12-14 days     Today's Date: 09/09/2012 Time: 0910-1005 and  1400-1430 Time Calculation (min): 55 min and 30 min  Problem List:  Patient Active Problem List  Diagnosis  . DM  . HYPERLIPIDEMIA  . OBSTRUCTIVE SLEEP APNEA  . HYPERTENSION  . DIASTOLIC HEART FAILURE, CHRONIC  . CAROTID STENOSIS  . CHRONIC OBSTRUCTIVE PULMONARY DISEASE  . EDEMA  . Dyspnea  . CAD  . ETOH abuse  . Atrial fibrillation  . Renal insufficiency  . NSTEMI (non-ST elevated myocardial infarction)  . Hematoma-on back, sacral area, right chest wall post fall  . Acute renal failure  . Weakness generalized  . Hypothyroidism  . Acute respiratory failure with hypoxia  . Elevated troponin  . Elevated LFTs  . Acute on chronic diastolic CHF (congestive heart failure)  . Pseudomonas urinary tract infection  . Hypokalemia  . L1 vertebral fracture    Past Medical History:  Past Medical History  Diagnosis Date  . CHRONIC OBSTRUCTIVE PULMONARY DISEASE 06/20/2009  . OBSTRUCTIVE SLEEP APNEA 06/20/2009  . CAROTID STENOSIS 06/20/2009    A. 08/2001 s/p L CEA;  B.   09/14/11 - Carotid U/S - 40-59% bilateral stenosis, left CEA patch angioplasty is patent  . DM 06/20/2009  . CAD 06/20/2009    A.  08/2000 - s/p CABG x 4 - LIMA-LAD, Left Radial-OM, VG-DIAG, VG-RCA;  B. Neg. MV  2010  . HYPERLIPIDEMIA 06/20/2009  . HYPERTENSION 06/20/2009  . Hypothyroidism   . Low back pain   . Asthma     as child  . Pneumonia   . Atrial fibrillation     Not felt to be coumadin candidate 2/2 ETOH use.  Marland Kitchen Hypothyroidism   . ETOH abuse   . History of tobacco abuse     remote - quit 1970  . Bilateral renal cysts   . Marijuana abuse   . Morbidly obese   . CAD (coronary artery disease)   . CHF (congestive  heart failure)   . Falls frequently    Past Surgical History:  Past Surgical History  Procedure Date  . Carotid endarterectomy 2002    left  . Coronary artery bypass graft     x 4 - 2001    Assessment & Plan Clinical Impression:[name] is a 66 y.o. right-handed male with history of COPD, atrial fibrillation but not a candidate for Coumadin secondary to history alcohol use, CAD with coronary artery bypass grafting 2001, left lower extremity chronic hematoma and chronic diastolic CHF. Admitted 08/30/2012 with altered mental status and oxygen saturations 85% on room air. Findings of elevated creatinine 1.93. Cardiac enzymes were unremarkable. Chest x-ray completed showing moderate pulmonary vascular congestion suspect CHF. Echocardiogram with ejection fraction of 60% and moderate RV dilatation, moderate TR.  Subcutaneous heparin added for DVT prophylaxis. Hospital course Pseudomonas UTI maintained on Cipro. Patient with multiple soft tissue masses on his back bilaterally primarily along the left flank hematoma and left leg that was chronic in nature. Noncontrast CT of abdomen and pelvis showed interval decrease in size of previously described right sided presumed subcutaneous hematomas. Noted increasing cellulitic changes lower tremor to his questionable cellulitis intravenous vancomycin was initiated 09/08/2012 x7 days. There was incidental finding of new acute/subacute L1 transverse process fracture which was discussed with  neurosurgery Dr. Phoebe Perch and advise conservative care and did not need outpatient followup with neurosurgery.  Patient transferred to CIR on 09/08/2012 .   Patient currently requires total assistance of 2 people with mobility secondary to muscle weakness and muscle joint tightness and decreased cardiorespiratoy endurance and decreased oxygen support.  Prior to hospitalization, patient  required help from family for with mobility and lived with Daughter and son-in-law in a  House home.  Home access is 3 STE, 2 rails 3Stairs to enter.  Patient will benefit from skilled PT intervention to maximize safe functional mobility, minimize fall risk and decrease caregiver burden for planned discharge home with 24 hour assist.  Anticipate patient will benefit from follow up [facility] at discharge.  PT - End of Session Endurance Deficit: Yes Endurance Deficit Description: Patient reports feeling fatigued with minimal activity PT Assessment Barriers to Discharge: Decreased caregiver support PT Recommendation Equipment Recommended: 3 in 1 bedside comode;Tub/shower seat  PT Evaluation Precautions/Restrictions Precautions Precautions: Fall Restrictions Weight Bearing Restrictions: No Vital Signs Therapy Vitals  Pulse Rate: 89  Patient Position, if appropriate: Sitting Oxygen Therapy SpO2: 94 % O2 Device: Nasal cannula O2 Flow Rate (L/min): 2 L/min Pain Pain Assessment Pain Assessment: 0-10 Pain Score:   3 Pain Type: Chronic pain Pain Location: Leg Pain Orientation: Left Pain Descriptors: Aching;Numbness Pain Onset: On-going Pain Intervention(s): Medication (See eMAR) Home Living/Prior Functioning Home Living Lives With: Family;Daughter Available Help at Discharge: Family;Available PRN/intermittently (pt thinks he will have help at home but not certain) Type of Home: House Home Access: Stairs to enter [employer] of Steps: 3 Entrance Stairs-Rails: Can reach both Home Layout: One level  Home Adaptive Equipment: Walker - rolling;Straight cane Prior Function Level of Independence: Needs assistance with ADLs;Needs assistance with homemaking;Needs assistance with gait;Needs assistance with tranfers Able to Take Stairs?: No Vision/Perception  Vision - History Baseline Vision: Wears glasses all the time Patient Visual Report: No change from baseline Vision - Assessment Eye Alignment: Within Functional Limits  Cognition Overall Cognitive Status:  Impaired at baseline (limited mental flexibility, limited insight) Arousal/Alertness: Lethargic (difficult to arouse initially) Orientation Level: Oriented X4 Attention: Selective Selective Attention: Appears intact Memory: Appears intact Awareness: Appears intact (aware of deficits) Problem Solving: Appears intact (directs others to solve problems,  little attempt on his own) Safety/Judgment: Appears intact Sensation Sensation- absent lt touch bil LEs in stocking distribution; absent ankle proprioception bil LEs  Motor  Motor Motor: Within Functional Limits Motor - Skilled Clinical Observations: limited functional use of left upper extremity during ADL  Mobility Bed Mobility Supine to Sit: 4: Min assist Sit to Supine: 4: Min assist Locomotion  Ambulation Ambulation: Yes Ambulation/Gait Assistance: 1: +2 Total assist Assistive device: Parallel bars Ambulation/Gait Assistance Details: Verbal cues for technique Gait Gait: Yes Gait Pattern: Decreased hip/knee flexion - left;Decreased dorsiflexion - right;Decreased dorsiflexion - left;Decreased hip/knee flexion - right;Shuffle;Narrow base of support;Step-through pattern Gait velocity: decreased Wheelchair Mobility Wheelchair Mobility: Yes Wheelchair Assistance: 5: Investment banker, operational: Both upper extremities Wheelchair Parts Management: Needs assistance  Trunk/Postural Assessment  Cervical Assessment Cervical Assessment: Within Functional Limits Thoracic Assessment Thoracic Assessment: Within Functional Limits Lumbar Assessment Lumbar Assessment: Within Functional Limits (posterior orientation of pelvis) Postural Control Postural Control: Within Functional Limits  Balance Balance Balance Assessed: Yes Static Standing Balance Static Standing - Balance Support: Bilateral upper extremity supported Static Standing - Level of Assistance: 4: Min assist Static Standing - Comment/# of Minutes: max assist to  transition to standing.  Anxious without UE support.  Extremity Assessment  RUE Assessment RUE Assessment: Within Functional Limits LUE Assessment LUE Assessment: Exceptions to [facility] (report of neuropathy, does not spontaneously use functionall) RLE Assessment RLE Assessment: Exceptions to [facility] RLE Strength RLE Overall Strength Comments: hip flexion 4-/5; knee extension 4+/5; ankle DF 3-/5 LLE Strength LLE Overall Strength Comments: hip flexion 3-/5; knee extension 4+/5; ankle DF 1+/5 Treatment today:  AM- therapeutic activity- scooting forward/backward in w/c for transfer training, sit>< stand in parallel bars, focusing on transferring wt forward, pushing up with bil hands; wt shifting L><R  in standing, focusing on decreasing dependence on hands for support.  Pt performed 10 x 1 bil ankle pumps in sitting; discussed benefits for LE edema.  PAfter therapist returned pt to room, call bell and phone placed within reach, and fall precautions reiterated.    PM- Pt in recliner; transported to gym in recliner.  Reviewed ankle pumps for edema reduction; pt unable to remember them from AM tx.  Sit> stand with pt pulling up on parallel bars.  Standing therapeutic exercise with bil UE support, performed with bil LEs to increase strength for functional mobility: marching in place, mini-squats, sidestepping L and R with neutral hip rotation, attempted calf raises with difficulty.  Therapist pushed pt in recliner back to room; pt able to alternately flex and extend R and L knees in stepping pattern to assist with propulsion. Pt returned to room, SCDs reapplied with pt in recliner with LEs elevated.  Reminded pt that bil ankle pumps are important for him to do as often as possible. See FIM for current functional status Refer to Care Plan for Long Term Goals  Recommendations for other services: None  Discharge Criteria: Patient will be discharged from PT if patient refuses treatment 3 consecutive times  without medical reason, if treatment goals not met, if there is a change in medical status, if patient makes no progress towards goals or if patient is discharged from hospital.  The above assessment, treatment plan, treatment alternatives and goals were discussed and mutually agreed upon: by patient  Amita Atayde 09/09/2012, 4:41 PM

## 2012-09-10 LAB — GLUCOSE, CAPILLARY
Glucose-Capillary: 135 mg/dL — ABNORMAL HIGH (ref 70–99)
Glucose-Capillary: 154 mg/dL — ABNORMAL HIGH (ref 70–99)
Glucose-Capillary: 132 mg/dL — ABNORMAL HIGH (ref 70–99)

## 2012-09-10 NOTE — Progress Notes (Signed)
LBM 09/08/12, needs order for senna s. Stands to void into rectal pouch instead of using condom cath. Condom caths would not stay on. Informed patient that we will remove the pouch and use urinal, BSC or go into bathroom, starting Sunday after breakfast. Patient reluctant but willing to try. Calling for pain med this am, but falling asleep during conversation. Insists on taking pain med, only gave 1 percocet. Complaining of back pain. Alfredo Martinez A

## 2012-09-10 NOTE — Progress Notes (Signed)
Subjective/Complaints: No specific complaints Has a BM when he takes lactulose  Objective: Vital Signs: Blood pressure 108/64, pulse 61, temperature 97.4 F (36.3 C), temperature source Oral, resp. rate 20, weight 269 lb 10 oz (122.3 kg), SpO2 98.00%.  Exam:   Chest-cta Cv_irreg rate abd- obese, soft, nontender extr- 2+ edema in bilateral LE- hyperpigmentation and thickening of skin (consistent with venous insuff) A/P  Medical Problem List and Plan: 1. Deconditioning related to CHF and multi-medical, toxic encephalopathy 2. DVT Prophylaxis/Anticoagulation: Subcutaneous heparin. Monitor platelet counts any signs of bleeding 3. Pain Management: Neurontin 300 mg daily, Percocet as needed. Monitor mental status 4. Mood/delirium/depression. Prozac 20 mg daily. 5. Neuropsych: This patient is capable of making decisions on his/her own behalf. 6. Hypertension/atrial fibrillation. Reviewed meds Not a warfarin candidate 7. Pseudomonas UTI. Continue Cipro for total of 7 days 8. Subcutaneous masses on lower back and lower tremor the felt to be chronic in nature.  9. Question cellulitis lower extremities. Vancomycin initiate 09/08/2012 x7 days. Monitor skin closely (no new break down) 10. Diabetes mellitus. Hemoglobin A1c of 5.8. Lantus insulin 10 units daily. Check blood sugars a.c. and at bedtime 11. Incidental finding of L1 transverse process fracture acute versus subacute. Conservative care advised as per neurosurgery Dr. Phoebe Perch 12. Hypothyroidism. Synthroid 13. Hyperlipidemia. Zocor 14. Chronic renal insufficiency.  Basic Metabolic Panel:    Component Value Date/Time   NA 136 09/09/2012 0620   K 3.6 09/09/2012 0620   CL 91* 09/09/2012 0620   CO2 35* 09/09/2012 0620   BUN 58* 09/09/2012 0620   CREATININE 1.57* 09/09/2012 0620   GLUCOSE 127* 09/09/2012 0620   CALCIUM 9.3 09/09/2012 0620    15. COPD. -O2 sats are stable SpO2 Readings from Last 3 Encounters:  09/10/12 98%    09/08/12 100%  10/16/11 97%         LOS (Days) 2 A FACE TO FACE EVALUATION WAS PERFORMED  SWORDS,BRUCE HENRY 09/10/2012 8:48 AM

## 2012-09-11 ENCOUNTER — Inpatient Hospital Stay (HOSPITAL_COMMUNITY): Payer: Medicare Other

## 2012-09-11 ENCOUNTER — Inpatient Hospital Stay (HOSPITAL_COMMUNITY): Payer: Medicare Other | Admitting: Speech Pathology

## 2012-09-11 ENCOUNTER — Inpatient Hospital Stay (HOSPITAL_COMMUNITY): Payer: Medicare Other | Admitting: Occupational Therapy

## 2012-09-11 DIAGNOSIS — I5033 Acute on chronic diastolic (congestive) heart failure: Secondary | ICD-10-CM

## 2012-09-11 DIAGNOSIS — R5381 Other malaise: Secondary | ICD-10-CM

## 2012-09-11 DIAGNOSIS — I509 Heart failure, unspecified: Secondary | ICD-10-CM

## 2012-09-11 LAB — BASIC METABOLIC PANEL
BUN: 59 mg/dL — ABNORMAL HIGH (ref 6–23)
Chloride: 91 mEq/L — ABNORMAL LOW (ref 96–112)
GFR calc Af Amer: 56 mL/min — ABNORMAL LOW (ref >90)
Glucose, Bld: 148 mg/dL — ABNORMAL HIGH (ref 70–99)
Potassium: 4.1 mEq/L (ref 3.5–5.1)

## 2012-09-11 LAB — GLUCOSE, CAPILLARY
Glucose-Capillary: 118 mg/dL — ABNORMAL HIGH (ref 70–99)
Glucose-Capillary: 152 mg/dL — ABNORMAL HIGH (ref 70–99)

## 2012-09-11 MED ORDER — SALINE SPRAY 0.65 % NA SOLN
1.0000 | NASAL | Status: DC | PRN
  Administered 2012-09-11 – 2012-09-14 (×6): 1 via NASAL
  Filled 2012-09-11: qty 44

## 2012-09-11 MED ORDER — TORSEMIDE 20 MG PO TABS
40.0000 mg | ORAL_TABLET | Freq: Two times a day (BID) | ORAL | Status: DC
  Administered 2012-09-11 – 2012-09-12 (×2): 40 mg via ORAL
  Filled 2012-09-11 (×4): qty 2

## 2012-09-11 MED ORDER — ALTEPLASE 2 MG IJ SOLR
2.0000 mg | Freq: Once | INTRAMUSCULAR | Status: AC
  Administered 2012-09-11: 2 mg
  Filled 2012-09-11: qty 2

## 2012-09-11 NOTE — Care Management Note (Signed)
[facility] Individual Statement of Services  Patient Name:  [name]  Date:  09/11/2012  Welcome to the [facility]patient Rehabilitation [facility].  Our goal is to provide you with an individualized program based on your diagnosis and situation, designed to meet your specific needs.  With this comprehensive rehabilitation program, you will be expected to participate in at least 3 hours of rehabilitation therapies Monday-Friday, with modified therapy programming on the weekends.  Your rehabilitation program will include the following services:  Physical Therapy (PT), Occupational Therapy (OT), Speech Therapy (ST), 24 hour per day rehabilitation nursing, Therapeutic Recreaction (TR), Case Management (RN and Child psychotherapist), Rehabilitation Medicine, Nutrition Services and Pharmacy Services  Weekly team conferences will be held on Wednesday to discuss your progress.  Your RN Case Designer, television/film set will talk with you frequently to get your input and to update you on team discussions.  Team conferences with you and your family in attendance may also be held.  Expected length of stay: 10-12 days Overall anticipated outcome: supervision/min level  Depending on your progress and recovery, your program may change.  Your RN Case Estate agent will coordinate services and will keep you informed of any changes.  Your RN Sports coach and SW names and contact numbers are listed  below.  The following services may also be recommended but are not provided by the Inpatient Rehabilitation Center:   Driving Evaluations  Home Health Rehabiltiation Services  Outpatient Rehabilitatation [facility]  Vocational Rehabilitation   Arrangements will be made to provide these services after discharge if needed.  Arrangements include referral to agencies that provide these services.  Your insurance has been verified to be:  Medicare Your primary doctor is:  Dr [name]  Pertinent  information will be shared with your doctor and your insurance company.    Social Worker:  Dossie Der, [place] [phone]  Information discussed with and copy given to patient by: Lucy Chris, 09/11/2012, 8:50 AM

## 2012-09-11 NOTE — Progress Notes (Signed)
Advanced Heart Failure Rounding Note  Subjective:  [name] is a 66 yo male with PMX s/f CAD (s/p CABG x 4 in 2001, normal Myoview 2010), chronic diastolic CHF, persistent a-fib, DM, HTN, HL, carotid artery disease, COPD, hypothyroidism, EtOH abuse, chronic venous insufficiency and morbid obesity.  He is not anticoagulants due to ETOH abuse and fall risk.   Echo 09/2011: LVEF 45-50%, mod biatral enlargement, moderate RV dilatation, moderate TR, PASP 66 mmHg.  ECHO 08/2012 EF 55-60% moderate bilateral RA/LA enlargement  Admitted 08/30/12 for acute on chronic diastolic CHF. He has been diuresed with IV lasix 40 mg bid.   Admit weight 278 but trending up to 282 pounds. After reviewing office note weights from the last 6 months his weight was 240-250 pounds.   Lasix drip stopped 08/2812. 09/07/12 CT showed multiple resolving SQ hematomas. D/C from acute care 09/08/12 to acute rehab weight at that time was 264 pounds. Switched to Toresemide 40 mg daily.    Cr 1.17->1.4->1.5> 1.57 pending.    Denies SOB/PND/Orthopnea   Intake/Output Summary (Last 24 hours) at 09/11/12 0753 Last data filed at 09/11/12 0600  Gross per 24 hour  Intake   1860 ml  Output   1600 ml  Net    260 ml    Current meds:    . aspirin  325 mg Oral Daily  . carvedilol  6.25 mg Oral BID WC  . ciprofloxacin  500 mg Oral BID  . FLUoxetine  20 mg Oral Daily  . folic acid  1 mg Oral Daily  . gabapentin  300 mg Oral Daily  . heparin subcutaneous  5,000 Units Subcutaneous Q8H  . insulin aspart  0-9 Units Subcutaneous TID WC  . insulin glargine  10 Units Subcutaneous QHS  . lactulose  30 g Oral Daily  . levothyroxine  150 mcg Oral QAC breakfast  . multivitamin with minerals  1 tablet Oral Daily  . potassium chloride  40 mEq Oral BID  . simvastatin  40 mg Oral QHS  . spironolactone  25 mg Oral Daily  . torsemide  40 mg Oral BID  . vancomycin  1,500 mg Intravenous Q24H  . [DISCONTINUED] torsemide  40 mg Oral Daily    Infusions:     Objective:  Blood pressure 126/69, pulse 69, temperature 98.3 F (36.8 C), temperature source Oral, resp. rate 20, weight 268 lb 8.3 oz (121.8 kg), SpO2 97.00%. Weight change: -1 lb 1.6 oz (-0.5 kg)  Physical Exam: General:  Chronically ill. No resp difficulty HEENT: normal Neck: supple. JVP 10-11 see Carotids 2+ bilat; no bruits. No lymphadenopathy or thryomegaly appreciated. Cor: PMI nondisplaced. Regular rate & rhythm. No rubs, gallops. 1/6 SEM RUSB. Lungs: Crackles at bases bilaterally Abdomen: obese soft, nontender, nondistended. No hepatosplenomegaly. No bruits or masses. Good bowel sounds. Large irregular subQ masses on back Extremities: no cyanosis, clubbing, rash, R and LLE 1+ edema  . Diffuse erythema concerning for cellulitis Neuro: alert & orientedx3, cranial nerves grossly intact. moves all 4 extremities w/o difficulty. Affect pleasant   Basic Metabolic Panel:  Lab 09/09/12 4132 09/08/12 0430 09/07/12 0500 09/06/12 0500 09/05/12 0500  NA 136 138 137 136 136  K 3.6 3.8 -- -- --  CL 91* 91* 91* 88* 90*  CO2 35* 39* 39* 39* 39*  GLUCOSE 127* 151* 154* 189* 182*  BUN 58* 59* 57* 57* 59*  CREATININE 1.57* 1.58* 1.40* 1.17 1.13  CALCIUM 9.3 9.6 9.4 9.5 9.4  MG -- -- 1.5 -- --  PHOS -- -- -- -- --   Liver Function Tests:  Lab 09/09/12 0620  AST 16  ALT 13  ALKPHOS 64  BILITOT 0.7  PROT 6.8  ALBUMIN 2.9*   No results found for this basename: LIPASE:5,AMYLASE:5 in the last 168 hours  Lab 09/07/12 1215  AMMONIA 32   CBC:  Lab 09/09/12 0620 09/08/12 0430 09/07/12 0500 09/06/12 0500 09/05/12 0500  WBC 6.1 6.5 6.3 6.4 5.6  NEUTROABS 3.6 -- -- -- --  HGB 8.0* 8.6* 8.1* 8.7* 8.4*  HCT 27.7* 29.6* 28.0* 29.8* 29.4*  MCV 88.5 88.1 87.5 89.2 88.6  PLT 132* 145* 128* 135* 132*   Cardiac Enzymes: No results found for this basename: CKTOTAL:5,CKMB:5,CKMBINDEX:5,TROPONINI:5 in the last 168 hours BNP: No components found with this basename:  POCBNP:5 CBG:  Lab 09/11/12 0727 09/10/12 2019 09/10/12 1611 09/10/12 1133 09/10/12 0723  GLUCAP 118* 209* 132* 154* 135*   Microbiology: Lab Results  Component Value Date   CULT PSEUDOMONAS AERUGINOSA 09/04/2012   CULT NO GROWTH 10/05/2011   CULT NO GROWTH 5 DAYS 10/05/2011   CULT NO GROWTH 5 DAYS 10/05/2011   CULT NO GROWTH 5 DAYS 06/25/2010    Lab 09/04/12 1315  CULT PSEUDOMONAS AERUGINOSA  SDES URINE, CLEAN CATCH    Imaging: No results found.   ASSESSMENT:   1. A/C diastolic heart failure EF 55-60% RV moderately dilated/HK with RV-RA gradient 56 mmHg.  2. Right heart failure 3. A Fib 4. Hypothyroidism 5. Obesity 6. CAD 7. Hematoma 8. Acute renal failure (baseline creatinine 0.8-1.1): suspect cardiorenal  9. Hypokalemia 10. SubQ masses on lower back = hematomas (resolving) 11. LE cellulitis  PLAN/DISCUSSION  He appears volume overloaded on exam.  He is comfortable symptomatically.  For now, will increase Torsemide to 40 mg bid following I/Os and weights. Needs BMET today.  If creatinine continues to trend up, may need to reassess.   Appreciate rehab services.   [name] 09/11/2012 7:55 AM

## 2012-09-11 NOTE — Progress Notes (Signed)
Patient information reviewed and entered into eRehab system by Valeria Boza, RN, CRRN, PPS Coordinator.  Information including medical coding and functional independence measure will be reviewed and updated through discharge.     Per nursing patient was given "Data Collection Information Summary for Patients in Inpatient Rehabilitation Facilities with attached "Privacy Act Statement-Health Care Records" upon admission.  

## 2012-09-11 NOTE — Progress Notes (Signed)
Patient ID: [name], male   DOB: [birth year], 66 y.o.   MRN: [id number]  Subjective/Complaints: male with history of COPD, atrial fibrillation but not a candidate for Coumadin secondary to history alcohol use, CAD with coronary artery bypass grafting 2001, left lower extremity chronic hematoma and chronic diastolic CHF. Admitted 08/30/2012 with altered mental status and oxygen saturations 85% on room air. Findings of elevated creatinine 1.93. Cardiac enzymes were unremarkable. Chest x-ray completed showing moderate pulmonary vascular congestion suspect CHF. Echocardiogram with ejection fraction of 60% and moderate RV dilatation, moderate TR. Patient placed on intravenous Lasix with followup cardiology services monitored closely for volume overload. His volume status greatly improved and Lasix was changed to Demadex. Subcutaneous heparin added for DVT prophylaxis. Hospital course Pseudomonas UTI maintained on Cipro. Patient with multiple soft tissue masses on his back bilaterally primarily along the left flank hematoma and left leg that was chronic in nature. Noncontrast CT of abdomen and pelvis showed interval decrease in size of previously described right sided presumed subcutaneous hematomas. Noted increasing cellulitic changes lower tremor to his questionable cellulitis intravenous vancomycin was initiated 09/08/2012 x7 days. There was incidental finding of new acute/subacute L1 transverse process fracture which was discussed with neurosurgery Dr. Phoebe Perch and advise conservative care and did not need outpatient followup with neurosurgery.    No breathing problems, uses O2 at home  Objective: Vital Signs: Blood pressure 126/69, pulse 69, temperature 98.3 F (36.8 C), temperature source Oral, resp. rate 20, weight 121.8 kg (268 lb 8.3 oz), SpO2 97.00%. No results found. Results for orders placed during the hospital encounter of 09/08/12 (from the past 72 hour(s))  GLUCOSE, CAPILLARY     Status:  Abnormal   Collection Time   09/08/12  4:48 PM      Component Value Range Comment   Glucose-Capillary 180 (*) 70 - 99 mg/dL   GLUCOSE, CAPILLARY     Status: Abnormal   Collection Time   09/08/12  8:22 PM      Component Value Range Comment   Glucose-Capillary 211 (*) 70 - 99 mg/dL    Comment 1 Notify RN     CBC WITH DIFFERENTIAL     Status: Abnormal   Collection Time   09/09/12  6:20 AM      Component Value Range Comment   WBC 6.1  4.0 - 10.5 K/uL    RBC 3.13 (*) 4.22 - 5.81 MIL/uL    Hemoglobin 8.0 (*) 13.0 - 17.0 g/dL    HCT 40.9 (*) 81.1 - 52.0 %    MCV 88.5  78.0 - 100.0 fL    MCH 25.6 (*) 26.0 - 34.0 pg    MCHC 28.9 (*) 30.0 - 36.0 g/dL    RDW 91.4 (*) 78.2 - 15.5 %    Platelets 132 (*) 150 - 400 K/uL    Neutrophils Relative 59  43 - 77 %    Neutro Abs 3.6  1.7 - 7.7 K/uL    Lymphocytes Relative 22  12 - 46 %    Lymphs Abs 1.4  0.7 - 4.0 K/uL    Monocytes Relative 13 (*) 3 - 12 %    Monocytes Absolute 0.8  0.1 - 1.0 K/uL    Eosinophils Relative 5  0 - 5 %    Eosinophils Absolute 0.3  0.0 - 0.7 K/uL    Basophils Relative 0  0 - 1 %    Basophils Absolute 0.0  0.0 - 0.1 K/uL   COMPREHENSIVE METABOLIC PANEL  Status: Abnormal   Collection Time   09/09/12  6:20 AM      Component Value Range Comment   Sodium 136  135 - 145 mEq/L    Potassium 3.6  3.5 - 5.1 mEq/L    Chloride 91 (*) 96 - 112 mEq/L    CO2 35 (*) 19 - 32 mEq/L    Glucose, Bld 127 (*) 70 - 99 mg/dL    BUN 58 (*) 6 - 23 mg/dL    Creatinine, Ser 1.61 (*) 0.50 - 1.35 mg/dL    Calcium 9.3  8.4 - 09.6 mg/dL    Total Protein 6.8  6.0 - 8.3 g/dL    Albumin 2.9 (*) 3.5 - 5.2 g/dL    AST 16  0 - 37 U/L    ALT 13  0 - 53 U/L    Alkaline Phosphatase 64  39 - 117 U/L    Total Bilirubin 0.7  0.3 - 1.2 mg/dL    GFR calc non Af Amer 44 (*) >90 mL/min    GFR calc Af Amer 51 (*) >90 mL/min   GLUCOSE, CAPILLARY     Status: Abnormal   Collection Time   09/09/12  7:24 AM      Component Value Range Comment    Glucose-Capillary 134 (*) 70 - 99 mg/dL    Comment 1 Notify RN     GLUCOSE, CAPILLARY     Status: Abnormal   Collection Time   09/09/12 11:44 AM      Component Value Range Comment   Glucose-Capillary 147 (*) 70 - 99 mg/dL    Comment 1 Notify RN     GLUCOSE, CAPILLARY     Status: Abnormal   Collection Time   09/09/12  4:42 PM      Component Value Range Comment   Glucose-Capillary 126 (*) 70 - 99 mg/dL    Comment 1 Notify RN     GLUCOSE, CAPILLARY     Status: Abnormal   Collection Time   09/09/12  8:22 PM      Component Value Range Comment   Glucose-Capillary 168 (*) 70 - 99 mg/dL    Comment 1 Notify RN     GLUCOSE, CAPILLARY     Status: Abnormal   Collection Time   09/10/12  7:23 AM      Component Value Range Comment   Glucose-Capillary 135 (*) 70 - 99 mg/dL    Comment 1 Notify RN     GLUCOSE, CAPILLARY     Status: Abnormal   Collection Time   09/10/12 11:33 AM      Component Value Range Comment   Glucose-Capillary 154 (*) 70 - 99 mg/dL    Comment 1 Notify RN     GLUCOSE, CAPILLARY     Status: Abnormal   Collection Time   09/10/12  4:11 PM      Component Value Range Comment   Glucose-Capillary 132 (*) 70 - 99 mg/dL    Comment 1 Notify RN     GLUCOSE, CAPILLARY     Status: Abnormal   Collection Time   09/10/12  8:19 PM      Component Value Range Comment   Glucose-Capillary 209 (*) 70 - 99 mg/dL    Comment 1 Notify RN     GLUCOSE, CAPILLARY     Status: Abnormal   Collection Time   09/11/12  7:27 AM      Component Value Range Comment   Glucose-Capillary 118 (*) 70 - 99  mg/dL    Comment 1 Notify RN        HEENT: normal Cardio: irregular Resp: CTA B/L GI: BS positive Extremity:  Edema bilateral pretibial 2+ Skin:   Other stasis dermatitis bilateral pretib Neuro: Alert/Oriented, Abnormal Sensory diminished LT in feet, Abnormal Motor 4/5 in BUEs and RLE, 3-/5 in LLE and Other Hearing impaired Musc/Skel:  LB tender and Other Left lateral hip tenderness, also mild pain  with L hip ROM Gen NAD   Assessment/Plan: 1. Functional deficits secondary to Decondtioning acute on chronic diastolic CHFwhich require 3+ hours per day of interdisciplinary therapy in a comprehensive inpatient rehab setting. Physiatrist is providing close team supervision and 24 hour management of active medical problems listed below. Physiatrist and rehab team continue to assess barriers to discharge/monitor patient progress toward functional and medical goals. FIM: FIM - Bathing Bathing Steps Patient Completed: Chest;Right Arm;Left Arm;Abdomen;Right upper leg;Left upper leg Bathing: 3: Mod-Patient completes 5-7 19f 10 parts or 50-74%  FIM - Upper Body Dressing/Undressing Upper body dressing/undressing steps patient completed: Thread/unthread right sleeve of pullover shirt/dresss;Thread/unthread left sleeve of pullover shirt/dress;Put head through opening of pull over shirt/dress Upper body dressing/undressing: 4: Min-Patient completed 75 plus % of tasks FIM - Lower Body Dressing/Undressing Lower body dressing/undressing: 1: Total-Patient completed less than 25% of tasks  FIM - Toileting Toileting: 1: Total-Patient completed zero steps, helper did all 3  FIM - Toilet Transfers Toilet Transfers: 2-To toilet/BSC: Max A (lift and lower assist);2-From toilet/BSC: Max A (lift and lower assist)  FIM - Bed/Chair Transfer Bed/Chair Transfer: 3: Sit > Supine: Mod A (lifting assist/Pt. 50-74%/lift 2 legs);2: Bed > Chair or W/C: Max A (lift and lower assist)  FIM - Locomotion: Wheelchair Distance: 25 FIM - Locomotion: Ambulation Ambulation/Gait Assistance: 1: +2 Total assist  Comprehension Comprehension Mode: Auditory Comprehension: 5-Follows basic conversation/direction: With no assist  Expression Expression Mode: Verbal Expression: 5-Expresses basic needs/ideas: With extra time/assistive device  Social Interaction Social Interaction: 6-Interacts appropriately with others with  medication or extra time (anti-anxiety, antidepressant).  Problem Solving Problem Solving: 5-Solves basic problems: With no assist  Memory Memory Assistive Devices: Memory book Memory: 5-Recognizes or recalls 90% of the time/requires cueing < 10% of the time  Medical Problem List and Plan:  1. Deconditioning related to CHF and multi-medical, toxic encephalopathy  2. DVT Prophylaxis/Anticoagulation: Subcutaneous heparin. Monitor platelet counts any signs of bleeding  3. Pain Management: Neurontin 300 mg daily, Percocet as needed. Monitor mental status  4. Mood/delirium/depression. Prozac 20 mg daily. Provide emotional support and positive reinforcement  5. Neuropsych: This patient is capable of making decisions on his/her own behalf.  6. Hypertension/atrial fibrillation. Coreg 6.25 mg twice a day, Aldactone 25 mg daily, Demadex 40 mg daily. Monitor with increased mobility. No plans for chronic anti-coagulation secondary to fall risk and history of alcohol use  7. Pseudomonas UTI. Continue Cipro x7 days  8. Subcutaneous masses on lower back and lower tremor the felt to be chronic in nature. We'll continue to provide conservative care  9. Question cellulitis lower extremities. Vancomycin initiate 09/08/2012 x7 days. Monitor skin closely  10. Diabetes mellitus. Hemoglobin A1c of 5.8. Lantus insulin 10 units daily. Check blood sugars a.c. and at bedtime  11. Incidental finding of L1 transverse process fracture acute versus subacute. Conservative care advised as per neurosurgery Dr. Phoebe Perch  12. Hypothyroidism. Synthroid  13. Hyperlipidemia. Zocor  14. Chronic renal insufficiency. Creatinine baseline 1.27-1.58. Followup chemistries  15. COPD. Check oxygen saturations every shift  LOS (Days) 3 A FACE TO FACE EVALUATION WAS PERFORMED  Pearlee Arvizu E 09/11/2012, 8:36 AM

## 2012-09-11 NOTE — Progress Notes (Signed)
SLP Cancellation Note  Patient Details Name: [name] MRN: [id number] DOB: Jun 16, 1946   Cancelled treatment:        SLP attempted to see patient for diagnostic treatment session; however, due to toileting SLP was unable to see patient and therefore spent time during scheduled session talking with patient's daughter via phone.  Daughter stated that at times after her father first wakes up it is difficult to understand him.  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  As a result, SLP will defer further diagnostic treatment until 09/12/12.    Fae Pippin, M.A., CCC-SLP [phone]  Ein Rijo 09/11/2012, 4:09 PM

## 2012-09-11 NOTE — Progress Notes (Signed)
Occupational Therapy Session Note  Patient Details  Name: [name] MRN: [id number] Date of Birth: [birth year]  Today's Date: 09/11/2012 Time: 0903-1000 Time Calculation (min): 57 min   Skilled Therapeutic Interventions/Progress Updates:    Pt seen for B/D at sink level with a focus on sit to stand. Pt was able to stand to sink with mod assist and maintain static hold, but was unable to bathe below his waist. He stated that his daughter has been helping him with this for a long time.  Pt declined to don any clothing.  He wanted to rest in bed and wanted to wear top gown only. Pt was able to stand to walker with mod assist and take a few steps to bed.  Pt needed mod assist to lift legs and get adjusted into bed.  Therapy Documentation Precautions:  Precautions Precautions: Fall Precaution Comments: on nasal canula Restrictions Weight Bearing Restrictions: No   Vital Signs: Therapy Vitals Pulse Rate: 63  Oxygen Therapy SpO2: 80 % (after ambulating 14' in PT;  94%  when O2 replaced) O2 Device: None (Room air) Pain: Pain Assessment Pain Assessment: No/denies pain  ADL:  See FIM for current functional status  Therapy/Group: Individual Therapy  Lenor Provencher 09/11/2012, 11:57 AM

## 2012-09-11 NOTE — Progress Notes (Signed)
Rested quietly last night. O2 at 2l/m via [place]. Calls for assistance with urinal. Staff has to place, hold, and empty urinal. Continent of bowel and bladder. No pain med needed, last percocet given at 2030. Complained of itching, no rash observed. Reports not being on insulin at home. Needs encouragement to do for himself. [name] A

## 2012-09-11 NOTE — Progress Notes (Signed)
Physical Therapy Session Note  Patient Details  Name: [name] MRN: [id number] Date of Birth: April 02, 1946  Today's Date: 09/11/2012 Time: 0805-0905, 1115-1205,  4098-1191 Time Calculation (min): 60 min, 45 min, and and 40  Short Term Goals: Week 1:  PT Short Term Goal 1 (Week 1): Pt will transfer bed>< w/c or recliner with assistance of 1 person. PT Short Term Goal 2 (Week 1): Pt will propel w/c x 50' with supervision, for general activity tolerance. PT Short Term Goal 3 (Week 1): Pt will perform gait x 20' with LRAD and assistance of 1 person. PT Short Term Goal 4 (Week 1): Pt will stand x 5 minutes during functional activity.  Skilled Therapeutic Interventions/Progress Updates:   1st tx:  Pt supine with head elevated; stated he needed to urinate standing up.  Supine > sit with supervision, HOB elevated; scooting forward with min assist.  Sit> stand from elevated bed to RW, close supervision +2.  Used urinal in standing with assistance of second person.  Bed> w/c to L stand step with RW with min assist.  Gait training in gym x 14' with +2 assist for safety, with VCs for technique to keep feet within RW.  Therapeutic exercise performed with LE to increase strength for functional mobility in standing: 10 x 1 mini squats with RW.  In siting, 10 x 1 long arc quad extensions, bil hip adduction and IR (pt sits with hip abducted and ER'd); 10 x 1 ankle pumps with knees extended.  Sit> stand from 24" high mat, step pivot transfer with RW, min assist.  2nd treatment:  Pt supine in bed; rolling R and sitting up with min assist , max VCs  Pt stood from elevated bed with min assist, balance in standing to urinated with assistance of RN, close supervision using RW.  Sit> stand from w/c at 19.5" height after 3 attempts, requiring max assist of 1, max VCs for technique, and mod assist to bring each hip forward in w/c.  Gait training with RW x 5' x 2 with min assist of 1 person.      Therapeutic exercise performed with LE to increase strength for functional mobility in sitting: bil hip adduction to improve alignment, with isometric contraction at end range, 10 x 1 .  3rd tx:  W/c mobility using bil LEs for strengthening, x 30' before c/o increased L hip pain, 8/10.  RN notified, meds dispensed.  Therapeutic exercise performed with bil LEs to increase strength for functional mobility: -Pelvic strengthening for scooting forward/backward on high mat, one side at a time, x 3. -seated bil hip adduction with isometric contraction, 20 x 1 -seated bil alternating knee extension, 10 x 1 -standing, sidestepping L and R for abductor strengthening -standing, mini-squats 10 x 1 for hip and knee extensor strengthening -standing, 10 x 1 bil shoulder adduction to promote back extension   W/c>< mat, +2  assist, to RW.  Different cushion placed in w/c to raise seat height.    Sit to stand from 24" high mat, VCs for scooting forward, wt shifting, with close supervision.  Gait x 20' with RW, min assist, focusing on upright posture, forward gaze.  Pt c/o LLE weakness, but knee did not buckle.  Returned to room; pt requested staying in w/c.   Footstool provided for foot elevation, pillow under legrests at knees.  Cued pt for ankle pumping and bil hip internal rotation when sitting in w/c.    Therapy Documentation Precautions:  Precautions Precautions:  Fall Precaution Comments: on nasal canula Restrictions Weight Bearing Restrictions: No   Vital Signs: Therapy Vitals Pulse Rate: 63  Oxygen Therapy SpO2: 80 % (after ambulating 14' in PT;  94%  when O2 replaced) O2 Device: None (Room air) Pain: Pain Assessment Pain Assessment: No/denies pain Pain Score:   8 Pain Type: Chronic pain Pain Location: Hip Pain Orientation: Left Pain Descriptors: Aching;Discomfort Pain Intervention(s): RN made aware;Medication (See eMAR)   Other Treatments:   See FIM for current functional  status  Therapy/Group: Individual Therapy  Jamil Armwood 09/11/2012, 12:11 PM

## 2012-09-11 NOTE — Progress Notes (Signed)
Social Work Assessment and Plan Social Work Assessment and Plan  Patient Details  Name: [name] MRN: [id number] Date of Birth: 07/23/1946  Today's Date: 09/11/2012  Problem List:  Patient Active Problem List  Diagnosis  . DM  . HYPERLIPIDEMIA  . OBSTRUCTIVE SLEEP APNEA  . HYPERTENSION  . DIASTOLIC HEART FAILURE, CHRONIC  . CAROTID STENOSIS  . CHRONIC OBSTRUCTIVE PULMONARY DISEASE  . EDEMA  . Dyspnea  . CAD  . ETOH abuse  . Atrial fibrillation  . Renal insufficiency  . NSTEMI (non-ST elevated myocardial infarction)  . Hematoma-on back, sacral area, right chest wall post fall  . Acute renal failure  . Weakness generalized  . Hypothyroidism  . Acute respiratory failure with hypoxia  . Elevated troponin  . Elevated LFTs  . Acute on chronic diastolic CHF (congestive heart failure)  . Pseudomonas urinary tract infection  . Hypokalemia  . L1 vertebral fracture  . Physical deconditioning   Past Medical History:  Past Medical History  Diagnosis Date  . CHRONIC OBSTRUCTIVE PULMONARY DISEASE 06/20/2009  . OBSTRUCTIVE SLEEP APNEA 06/20/2009  . CAROTID STENOSIS 06/20/2009    A. 08/2001 s/p L CEA;  B.   09/14/11 - Carotid U/S - 40-59% bilateral stenosis, left CEA patch angioplasty is patent  . DM 06/20/2009  . CAD 06/20/2009    A.  08/2000 - s/p CABG x 4 - LIMA-LAD, Left Radial-OM, VG-DIAG, VG-RCA;  B. Neg. MV  2010  . HYPERLIPIDEMIA 06/20/2009  . HYPERTENSION 06/20/2009  . Hypothyroidism   . Low back pain   . Asthma     as child  . Pneumonia   . Atrial fibrillation     Not felt to be coumadin candidate 2/2 ETOH use.  Marland Kitchen Hypothyroidism   . ETOH abuse   . History of tobacco abuse     remote - quit 1970  . Bilateral renal cysts   . Marijuana abuse   . Morbidly obese   . CAD (coronary artery disease)   . CHF (congestive heart failure)   . Falls frequently    Past Surgical History:  Past Surgical History  Procedure Date  . Carotid endarterectomy 2002    left  .  Coronary artery bypass graft     x 4 - 2001   Social History:  reports that he quit smoking about 43 years ago. He does not have any smokeless tobacco history on file. He reports that he drinks alcohol. He reports that he uses illicit drugs (Marijuana).  Family / Support Systems Marital Status: Widow/Widower Patient Roles: Parent Children: Merlyn [phone] Anticipated Caregiver: daughter and son in-law Ability/Limitations of Caregiver: Daughter is not working and can assist Caregiver Availability: 24/7 Family Dynamics: Close knit small family who are supportive of one another.  Daughter has been assisting pt but he does do what he wants.  He is stubborn and does what he wants.  Social History Preferred language: English Religion: Quaker Cultural Background: No issues Education: [employer] Read: Yes Write: Yes Employment Status: Retired Fish farm manager Issues: No issues Guardian/Conservator: None-according to MD pt is capabel of makinghis own decisions   Abuse/Neglect Physical Abuse: Denies Verbal Abuse: Denies Sexual Abuse: Denies Exploitation of patient/patient's resources: Denies Self-Neglect: Denies  Emotional Status Pt's affect, behavior adn adjustment status: Pt is motivated but wants to go home soon, he has been told if he does well he can.  Pt likes others to do for him instead of him doing it himself.  Will try  to encourgae him to do for himself as much as he can. Recent Psychosocial Issues: Multiple medical issues Pyschiatric History: History of depression takes prozac and feels this helps him.  He is too tired to do depression screen and feels his meds help him.  Will continue to monitor while here. Substance Abuse History: History of tobacco, but stills drinks and is aware of the health risks involved.  Patient / Family Perceptions, Expectations & Goals Pt/Family understanding of illness & functional limitations: Pt is able to expalin his  heart and fluid issues and feels it will get better as soon as the fluid is gone.  He wants to get stronger and go home.  His daughter is available to assist him. Premorbid pt/family roles/activities: Father, grandfather, retiree, Home owner, etc Anticipated changes in roles/activities/participation: Resume Pt/family expectations/goals: Pt states: " I want to go home soon, but I guess I need to get stronger."  He feels he will get there he has in the past when this happens.  Daughter reports; " I hope he can be walk by himself."  [employer]: Other (Comment) (Had in the past) Premorbid Home Care/DME Agencies: Other (Comment) (Had in the past) Transportation available at discharge: Daughter Resource referrals recommended: Support group (specify) (Cardiac Support group)  Discharge Planning Living Arrangements: Children;Other relatives Support Systems: Children;Other relatives;Friends/neighbors Type of Residence: Private residence Insurance Resources: [employer] Financial Resources: Restaurant manager, fast food Screen Referred: No Living Expenses: Lives with family Money Management: Patient;Family Do you have any problems obtaining your medications?: No Home Management: Daughter Patient/Family Preliminary Plans: Return home with daughter and son in-law who can assist him.  She would like for him to be wlaking on his own, but has had several falls at home PTA.  Pt is stubborn and eos what he wants even if it is not safe for him. Social Work Anticipated Follow Up Needs: HH/OP;Support Group  Clinical Impression Pleasant gentleman who is motivated to improve to get back home with his daughter's help.  Daughter is supportive and willing to assist him, she has in the past. Need to get him stronger and fluid controlled.  Will need encouragement to do for himself.  Lucy Chris 09/11/2012, 10:36 AM

## 2012-09-12 ENCOUNTER — Inpatient Hospital Stay (HOSPITAL_COMMUNITY): Payer: Medicare Other | Admitting: Occupational Therapy

## 2012-09-12 ENCOUNTER — Inpatient Hospital Stay (HOSPITAL_COMMUNITY): Payer: Medicare Other | Admitting: Speech Pathology

## 2012-09-12 ENCOUNTER — Inpatient Hospital Stay (HOSPITAL_COMMUNITY): Payer: Medicare Other

## 2012-09-12 ENCOUNTER — Inpatient Hospital Stay (HOSPITAL_COMMUNITY): Payer: Medicare Other | Admitting: *Deleted

## 2012-09-12 DIAGNOSIS — R5381 Other malaise: Secondary | ICD-10-CM

## 2012-09-12 DIAGNOSIS — I509 Heart failure, unspecified: Secondary | ICD-10-CM

## 2012-09-12 LAB — VANCOMYCIN, TROUGH: Vancomycin Tr: 29.7 ug/mL (ref 10.0–20.0)

## 2012-09-12 LAB — BASIC METABOLIC PANEL
Chloride: 95 mEq/L — ABNORMAL LOW (ref 96–112)
Creatinine, Ser: 1.62 mg/dL — ABNORMAL HIGH (ref 0.50–1.35)
GFR calc Af Amer: 49 mL/min — ABNORMAL LOW (ref >90)
GFR calc non Af Amer: 42 mL/min — ABNORMAL LOW (ref >90)
Potassium: 3.9 mEq/L (ref 3.5–5.1)

## 2012-09-12 LAB — GLUCOSE, CAPILLARY
Glucose-Capillary: 118 mg/dL — ABNORMAL HIGH (ref 70–99)
Glucose-Capillary: 183 mg/dL — ABNORMAL HIGH (ref 70–99)

## 2012-09-12 MED ORDER — TORSEMIDE 20 MG PO TABS
40.0000 mg | ORAL_TABLET | Freq: Every day | ORAL | Status: DC
  Filled 2012-09-12 (×2): qty 2

## 2012-09-12 MED ORDER — ALTEPLASE 2 MG IJ SOLR
2.0000 mg | Freq: Once | INTRAMUSCULAR | Status: AC
  Administered 2012-09-12: 2 mg
  Filled 2012-09-12: qty 2

## 2012-09-12 MED ORDER — VANCOMYCIN HCL 1000 MG IV SOLR
750.0000 mg | INTRAVENOUS | Status: AC
  Administered 2012-09-13 – 2012-09-14 (×2): 750 mg via INTRAVENOUS
  Filled 2012-09-12 (×3): qty 750

## 2012-09-12 NOTE — Progress Notes (Signed)
Occupational Therapy Session Note  Patient Details  Name: [name] MRN: [id number] Date of Birth: 08/21/1946  Today's Date: 09/12/2012 Time: 0900-1000 Time Calculation (min): 60 min  Skilled Therapeutic Interventions/Progress Updates:      Pt seen for BADL retraining of toileting with a focus on sit to stand and standing balance with RW. Pt prefers to bathe every other day and only wanted to wear gowns.  Pt did very well coming into stand with cues to lean forward with mod assist and then he was able to stand pivot from wheelchair to [facility] and back.  Pt then requested to work on ambulating in and out of bathroom which he did with min assist.  Getting into small bathroom is difficult at this time with his O2 tubing and IV pole. His O2 levels were 85% on 2L after the short walk, but increased quickly with deep breathing.  Pt completed grooming at the sink.  Therapy Documentation Precautions:  Precautions Precautions: Fall Precaution Comments: on nasal canula Restrictions Weight Bearing Restrictions: No    Pain: denies pain  ADL:  See FIM for current functional status  Therapy/Group: Individual Therapy  Bhargav Barbaro 09/12/2012, 10:09 AM

## 2012-09-12 NOTE — Progress Notes (Signed)
Physical Therapy Note  Patient Details  Name: [name] MRN: [id number] Date of Birth: [birth year] Today's Date: 09/12/2012  8:00 - 9:00 60 minutes Individual session Patient denies pain.  Patient sitting up in bed eating breakfast upon entering room. Patient had removed oxygen to eat. O2 sats taken at 88. O2 reapplied on 2 liters. Sats immediately went up to 95%. Patient performed supine to sit with head of bed raised and bed rails with supervision. Patient sit to stand from raised bed with min assist and transferred to wheelchair with min assist. After 3 attempts, patient required +2 assist for sit to stand from wheelchair. Patient performed LE exercises in sitting x 20 reps including hip flexion, knee extension, and ankle dorsiflexion. Patient with limited active dorsiflexion on left. Patient sit to stand from elevated mat and ambulated 20 feet with min assist primarily for oxygen and IV equipment.   [name] 09/12/2012, 12:07 PM

## 2012-09-12 NOTE — Progress Notes (Signed)
Speech Language Pathology Daily Session Note & Discharge Summary  Patient Details  Name: [name] MRN: [id number] Date of Birth: [birth year]  Today's Date: 09/12/2012 Time: 1105-1130 Time Calculation (min): 25 min  Short Term Goals: Week 1: SLP Short Term Goal 1 (Week 1): Pt will participate in continued diagnostic treatment of cognitive function  SLP Short Term Goal 1 - Progress (Week 1): Met  Skilled Therapeutic Interventions: Skilled treatment session focused on addressing goal for diagnostic treatment of cognitive-linguistic abilities.  SLP facilitated session by reducing environmental distractions and patient was able to follow multi-step directions with self-cuing of repetition.  Patient was also able to verbalize information from doctor regarding diet and lifestyle modifications and was able to verbalize that his daughter was the one caring for him at home and needed to be educated.  Patient confirms that he does have some memory changes but has had them for a while and SLP educated on use of external aids which patient reported was difficult for him due to his limited reading and writing abilities (reports he completed the 6th grade).  As a result daughter will need to be the one keeping track of important daily information and no further skilled SLP services are warranted at this time.  SLP recommends continued integrated functional based cognitive therapy which can be addressed with OT/PT with use of repetition to facilitate carryover of new information.       FIM:  Comprehension Comprehension Mode: Auditory Comprehension: 6-Follows complex conversation/direction: With extra time/assistive device Expression Expression Mode: Verbal Expression: 6-Expresses complex ideas: With extra time/assistive device Social Interaction Social Interaction: 6-Interacts appropriately with others with medication or extra time (anti-anxiety, antidepressant). Problem Solving Problem Solving:  5-Solves basic problems: With no assist Memory Memory: 5-Recognizes or recalls 90% of the time/requires cueing < 10% of the time  Pain Pain Assessment Pain Assessment: No/denies pain  Therapy/Group: Individual Therapy   Speech Language Pathology Discharge Summary  Patient Details  Name: [name] MRN: [id number] Date of Birth: 05/10/1946  Today's Date: 09/12/2012  Patient has met 1 of 1 long term goals.  Patient to discharge at overall Modified Independent;Supervision level.  Reasons goals not met: n/a   Clinical Impression/Discharge Summary: SLP suspects patient to be at baseline function with cognitive-linguistic tasks (see above skilled theaputic intervention above for details) and therefore, no further skilled SLP services are warranted at this time.    Care Partner:  Caregiver Able to Provide Assistance: Yes  Type of Caregiver Assistance: Cognitive;Physical  Recommendation:  None     Equipment: none   Reasons for discharge: Treatment goals met;Discharged from hospital   Patient/Family Agrees with Progress Made and Goals Achieved: Yes   See FIM for current functional status  Charlane Ferretti., CCC-SLP [phone]  [name] 09/12/2012, 4:53 PM

## 2012-09-12 NOTE — Progress Notes (Signed)
Lumen #2 (white port) without blood return and difficult to flush.  Lumen #3 (grey port) has slow blood return and difficult to flush.  TPA ordered per protocol for white port.

## 2012-09-12 NOTE — Progress Notes (Signed)
Patient ID: [name], male   DOB: [birth year], 66 y.o.   MRN: [id number]  Subjective/Complaints: male with history of COPD, atrial fibrillation but not a candidate for Coumadin secondary to history alcohol use, CAD with coronary artery bypass grafting 2001, left lower extremity chronic hematoma and chronic diastolic CHF. Admitted 08/30/2012 with altered mental status and oxygen saturations 85% on room air. Findings of elevated creatinine 1.93. Cardiac enzymes were unremarkable. Chest x-ray completed showing moderate pulmonary vascular congestion suspect CHF. Echocardiogram with ejection fraction of 60% and moderate RV dilatation, moderate TR. Patient placed on intravenous Lasix with followup cardiology services monitored closely for volume overload. His volume status greatly improved and Lasix was changed to Demadex. Subcutaneous heparin added for DVT prophylaxis. Hospital course Pseudomonas UTI maintained on Cipro. Patient with multiple soft tissue masses on his back bilaterally primarily along the left flank hematoma and left leg that was chronic in nature. Noncontrast CT of abdomen and pelvis showed interval decrease in size of previously described right sided presumed subcutaneous hematomas. Noted increasing cellulitic changes lower tremor to his questionable cellulitis intravenous vancomycin was initiated 09/08/2012 x7 days. There was incidental finding of new acute/subacute L1 transverse process fracture which was discussed with neurosurgery Dr. Phoebe Perch and advise conservative care and did not need outpatient followup with neurosurgery.    No breathing problems, uses O2 at home  Objective: Vital Signs: Blood pressure 109/70, pulse 68, temperature 97.7 F (36.5 C), temperature source Oral, resp. rate 18, weight 126.1 kg (278 lb), SpO2 97.00%. No results found. Results for orders placed during the hospital encounter of 09/08/12 (from the past 72 hour(s))  GLUCOSE, CAPILLARY     Status: Abnormal    Collection Time   09/09/12 11:44 AM      Component Value Range Comment   Glucose-Capillary 147 (*) 70 - 99 mg/dL    Comment 1 Notify RN     GLUCOSE, CAPILLARY     Status: Abnormal   Collection Time   09/09/12  4:42 PM      Component Value Range Comment   Glucose-Capillary 126 (*) 70 - 99 mg/dL    Comment 1 Notify RN     GLUCOSE, CAPILLARY     Status: Abnormal   Collection Time   09/09/12  8:22 PM      Component Value Range Comment   Glucose-Capillary 168 (*) 70 - 99 mg/dL    Comment 1 Notify RN     GLUCOSE, CAPILLARY     Status: Abnormal   Collection Time   09/10/12  7:23 AM      Component Value Range Comment   Glucose-Capillary 135 (*) 70 - 99 mg/dL    Comment 1 Notify RN     GLUCOSE, CAPILLARY     Status: Abnormal   Collection Time   09/10/12 11:33 AM      Component Value Range Comment   Glucose-Capillary 154 (*) 70 - 99 mg/dL    Comment 1 Notify RN     GLUCOSE, CAPILLARY     Status: Abnormal   Collection Time   09/10/12  4:11 PM      Component Value Range Comment   Glucose-Capillary 132 (*) 70 - 99 mg/dL    Comment 1 Notify RN     GLUCOSE, CAPILLARY     Status: Abnormal   Collection Time   09/10/12  8:19 PM      Component Value Range Comment   Glucose-Capillary 209 (*) 70 - 99 mg/dL  Comment 1 Notify RN     GLUCOSE, CAPILLARY     Status: Abnormal   Collection Time   09/11/12  7:27 AM      Component Value Range Comment   Glucose-Capillary 118 (*) 70 - 99 mg/dL    Comment 1 Notify RN     BASIC METABOLIC PANEL     Status: Abnormal   Collection Time   09/11/12  8:30 AM      Component Value Range Comment   Sodium 133 (*) 135 - 145 mEq/L    Potassium 4.1  3.5 - 5.1 mEq/L    Chloride 91 (*) 96 - 112 mEq/L    CO2 32  19 - 32 mEq/L    Glucose, Bld 148 (*) 70 - 99 mg/dL    BUN 59 (*) 6 - 23 mg/dL    Creatinine, Ser 2.95 (*) 0.50 - 1.35 mg/dL    Calcium 9.2  8.4 - 62.1 mg/dL    GFR calc non Af Amer 48 (*) >90 mL/min    GFR calc Af Amer 56 (*) >90 mL/min   GLUCOSE,  CAPILLARY     Status: Abnormal   Collection Time   09/11/12 11:21 AM      Component Value Range Comment   Glucose-Capillary 146 (*) 70 - 99 mg/dL    Comment 1 Notify RN     GLUCOSE, CAPILLARY     Status: Abnormal   Collection Time   09/11/12  4:03 PM      Component Value Range Comment   Glucose-Capillary 152 (*) 70 - 99 mg/dL    Comment 1 Notify RN     GLUCOSE, CAPILLARY     Status: Abnormal   Collection Time   09/11/12  9:37 PM      Component Value Range Comment   Glucose-Capillary 160 (*) 70 - 99 mg/dL   BASIC METABOLIC PANEL     Status: Abnormal   Collection Time   09/12/12  5:00 AM      Component Value Range Comment   Sodium 134 (*) 135 - 145 mEq/L    Potassium 3.9  3.5 - 5.1 mEq/L    Chloride 95 (*) 96 - 112 mEq/L    CO2 32  19 - 32 mEq/L    Glucose, Bld 119 (*) 70 - 99 mg/dL    BUN 63 (*) 6 - 23 mg/dL    Creatinine, Ser 3.08 (*) 0.50 - 1.35 mg/dL    Calcium 9.3  8.4 - 65.7 mg/dL    GFR calc non Af Amer 42 (*) >90 mL/min    GFR calc Af Amer 49 (*) >90 mL/min   VANCOMYCIN, TROUGH     Status: Abnormal   Collection Time   09/12/12  5:30 AM      Component Value Range Comment   Vancomycin Tr 29.7 (*) 10.0 - 20.0 ug/mL   GLUCOSE, CAPILLARY     Status: Abnormal   Collection Time   09/12/12  7:11 AM      Component Value Range Comment   Glucose-Capillary 118 (*) 70 - 99 mg/dL    Comment 1 Notify RN        HEENT: normal Cardio: irregular Resp: CTA B/L GI: BS positive Extremity:  Edema bilateral pretibial 2+ Skin:   Other stasis dermatitis bilateral pretib Neuro: Alert/Oriented, Abnormal Sensory diminished LT in feet, Abnormal Motor 4/5 in BUEs and RLE, 3-/5 in LLE and Other Hearing impaired Musc/Skel:  LB tender and Other Left lateral hip tenderness,  also mild pain with L hip ROM Gen NAD   Assessment/Plan: 1. Functional deficits secondary to Decondtioning acute on chronic diastolic CHF which require 3+ hours per day of interdisciplinary therapy in a comprehensive  inpatient rehab setting. Physiatrist is providing close team supervision and 24 hour management of active medical problems listed below. Physiatrist and rehab team continue to assess barriers to discharge/monitor patient progress toward functional and medical goals. FIM: FIM - Bathing Bathing Steps Patient Completed: Chest;Right Arm;Left Arm;Abdomen;Right upper leg;Left upper leg Bathing: 3: Mod-Patient completes 5-7 21f 10 parts or 50-74%  FIM - Upper Body Dressing/Undressing Upper body dressing/undressing steps patient completed: Thread/unthread right sleeve of pullover shirt/dresss;Thread/unthread left sleeve of pullover shirt/dress;Put head through opening of pull over shirt/dress Upper body dressing/undressing: 0: Wears gown/pajamas-no public clothing FIM - Lower Body Dressing/Undressing Lower body dressing/undressing: 0: Wears Oceanographer  FIM - Toileting Toileting: 1: Total-Patient completed zero steps, helper did all 3  FIM - Archivist Transfers: 2-To toilet/BSC: Max A (lift and lower assist);2-From toilet/BSC: Max A (lift and lower assist)  FIM - Bed/Chair Transfer Bed/Chair Transfer: 1: Two helpers  FIM - Locomotion: Wheelchair Distance: 25 Locomotion: Wheelchair: 1: Travels less than 50 ft with supervision, cueing or coaxing FIM - Locomotion: Ambulation Locomotion: Ambulation Assistive Devices: Designer, industrial/product Ambulation/Gait Assistance: 4: Min assist Locomotion: Ambulation: 1: Travels less than 50 ft with minimal assistance (Pt.>75%)  Comprehension Comprehension Mode: Auditory Comprehension: 5-Understands complex 90% of the time/Cues < 10% of the time  Expression Expression Mode: Verbal Expression: 5-Expresses complex 90% of the time/cues < 10% of the time  Social Interaction Social Interaction: 6-Interacts appropriately with others with medication or extra time (anti-anxiety, antidepressant).  Problem Solving Problem Solving:  5-Solves basic 90% of the time/requires cueing < 10% of the time  Memory Memory Assistive Devices: Memory book Memory: 5-Recognizes or recalls 90% of the time/requires cueing < 10% of the time  Medical Problem List and Plan:  1. Deconditioning related to CHF and multi-medical, toxic encephalopathy  2. DVT Prophylaxis/Anticoagulation: Subcutaneous heparin. Monitor platelet counts any signs of bleeding  3. Pain Management: Neurontin 300 mg daily, Percocet as needed. Monitor mental status  4. Mood/delirium/depression. Prozac 20 mg daily. Provide emotional support and positive reinforcement  5. Neuropsych: This patient is capable of making decisions on his/her own behalf.  6. Hypertension/atrial fibrillation. Coreg 6.25 mg twice a day, Aldactone 25 mg daily, Demadex 40 mg daily. Monitor with increased mobility. No plans for chronic anti-coagulation secondary to fall risk and history of alcohol use  7. Pseudomonas UTI. Continue Cipro x7 days  8. Subcutaneous masses on lower back and lower tremor the felt to be chronic in nature. We'll continue to provide conservative care  9. Question cellulitis lower extremities. Vancomycin initiate 09/08/2012 x7 days. Monitor skin closely  10. Diabetes mellitus. Hemoglobin A1c of 5.8. Lantus insulin 10 units daily. Check blood sugars a.c. and at bedtime  11. Incidental finding of L1 transverse process fracture acute versus subacute. Conservative care advised as per neurosurgery Dr. Phoebe Perch  12. Hypothyroidism. Synthroid  13. Hyperlipidemia. Zocor  14. Chronic renal insufficiency. Creatinine baseline 1.27-1.58. Followup chemistries  15. COPD. Check oxygen saturations every shift    LOS (Days) 4 A FACE TO FACE EVALUATION WAS PERFORMED  Elwyn Klosinski E 09/12/2012, 8:31 AM

## 2012-09-12 NOTE — Progress Notes (Addendum)
Physical Therapy Session Note  Patient Details  Name: [name] MRN: [id number] Date of Birth: 02/12/1946  Today's Date: 09/12/2012 Time:10:15-11:00 ( )  Short Term Goals: Week 1:  PT Short Term Goal 1 (Week 1): Pt will transfer bed>< w/c or recliner with assistance of 1 person. PT Short Term Goal 2 (Week 1): Pt will propel w/c x 50' with supervision, for general activity tolerance. PT Short Term Goal 3 (Week 1): Pt will perform gait x 20' with LRAD and assistance of 1 person. PT Short Term Goal 4 (Week 1): Pt will stand x 5 minutes during functional activity.  Skilled Therapeutic Interventions/Progress Updates:    Tx focused on increased activity tolerance, LE strengthening, and gait training with RW. VSS throughout tx on 2L 02.   Pt with difficulty getting grip on WC rim, requesting therapist to push, but provided encouragement to reach target, needing Min A for pushing to travel 20'.   Min A for lifting to stand from [facility] and to step up and down on scale.   Gait training 1x40' and 1x35' with Min A for steadying and encouragement to continue. Cues needed for posture, pt able to achieve upright posture with visual feedback in mirror, but unable to maintain.   Seated therex for LE strengthening, bil x20 for each of following:  Ankle pumps: decreased ROM on L LAQ: 5 sec holds Marching:  Cues for decreased ER, but difficult due to soft tissue approximation  Serial sit<>stand x5 from 21" and x5 from 20" with close S, increased effort requied and cues for technique. Rest break needed and pt using back of LEs to assist lift.   Static standing x33min  For increased activity tolerance with UE assist of RW with close S. Pt attempted no UE assist, but had posterior LOB, needing Min A to recover.    Therapy Documentation Precautions:  Precautions Precautions: Fall Precaution Comments: on nasal canula Restrictions Weight Bearing Restrictions: No   Pain: None    See FIM for  current functional status  Therapy/Group: Individual Therapy   Clydene Laming, PT, DPT  Clydene Laming M 09/12/2012, 10:53 AM

## 2012-09-12 NOTE — Significant Event (Signed)
CRITICAL VALUE ALERT  Critical value received:  Vanc/Troph 29.7  Date of notification:  09/12/12  Time of notification:  0700  Critical value read back:yes  Nurse who received alert:  De Blanch  MD notified (1st page):  Gerhard Perches  Time of first page:  0702  MD notified (2nd page):  Time of second page:  Responding MD:  Gerhard Perches  Time MD responded:  [phone]

## 2012-09-12 NOTE — Progress Notes (Signed)
Advanced Heart Failure Rounding Note  Subjective:  [name] is a 66 yo male with PMX s/f CAD (s/p CABG x 4 in 2001, normal Myoview 2010), chronic diastolic CHF, persistent a-fib, DM, HTN, HL, carotid artery disease, COPD, hypothyroidism, EtOH abuse, chronic venous insufficiency and morbid obesity.  He is not anticoagulants due to ETOH abuse and fall risk.   Echo 09/2011: LVEF 45-50%, mod biatral enlargement, moderate RV dilatation, moderate TR, PASP 66 mmHg.  ECHO 08/2012 EF 55-60% moderate bilateral RA/LA enlargement  Admitted 08/30/12 for acute on chronic diastolic CHF. He has been diuresed with IV lasix 40 mg bid.   Admit weight 278 but trending up to 282 pounds. After reviewing office note weights from the last 6 months his weight was 240-250 pounds.   Lasix drip stopped 08/2812. 09/07/12 CT showed multiple resolving SQ hematomas. D/C from acute care 09/08/12 to acute rehab weight at that time was 264 pounds. Switched to Toresemide 40 mg daily.    Cr 1.17->1.4->1.5> 1.57>1.44>1.62    Denies SOB/PND/Orthopnea   Intake/Output Summary (Last 24 hours) at 09/12/12 0752 Last data filed at 09/12/12 0400  Gross per 24 hour  Intake   1080 ml  Output   2225 ml  Net  -1145 ml    Current meds:    . [COMPLETED] alteplase  2 mg Intracatheter Once  . aspirin  325 mg Oral Daily  . carvedilol  6.25 mg Oral BID WC  . ciprofloxacin  500 mg Oral BID  . FLUoxetine  20 mg Oral Daily  . folic acid  1 mg Oral Daily  . gabapentin  300 mg Oral Daily  . heparin subcutaneous  5,000 Units Subcutaneous Q8H  . insulin aspart  0-9 Units Subcutaneous TID WC  . insulin glargine  10 Units Subcutaneous QHS  . lactulose  30 g Oral Daily  . levothyroxine  150 mcg Oral QAC breakfast  . multivitamin with minerals  1 tablet Oral Daily  . potassium chloride  40 mEq Oral BID  . simvastatin  40 mg Oral QHS  . spironolactone  25 mg Oral Daily  . torsemide  40 mg Oral Daily  . vancomycin  750 mg Intravenous Q24H   . [DISCONTINUED] torsemide  40 mg Oral Daily  . [DISCONTINUED] torsemide  40 mg Oral BID  . [DISCONTINUED] vancomycin  1,500 mg Intravenous Q24H   Infusions:     Objective:  Blood pressure 109/70, pulse 68, temperature 97.7 F (36.5 C), temperature source Oral, resp. rate 18, weight 278 lb (126.1 kg), SpO2 97.00%. Weight change: 9 lb 7.7 oz (4.3 kg)  Physical Exam: General:  Chronically ill. No resp difficulty HEENT: normal Neck: supple. JVP 10-11 Carotids 2+ bilat; no bruits. No lymphadenopathy or thryomegaly appreciated. Cor: PMI nondisplaced. Regular rate & rhythm. No rubs, gallops. 1/6 SEM RUSB. Lungs: Crackles at bases bilaterally Abdomen: obese soft, nontender, nondistended. No hepatosplenomegaly. No bruits or masses. Good bowel sounds. Large irregular subQ masses on back Extremities: no cyanosis, clubbing, rash, R and LLE 1+ edema  . Diffuse erythema concerning for cellulitis Neuro: alert & orientedx3, cranial nerves grossly intact. moves all 4 extremities w/o difficulty. Affect pleasant   Basic Metabolic Panel:  Lab 09/12/12 1610 09/11/12 0830 09/09/12 0620 09/08/12 0430 09/07/12 0500  NA 134* 133* 136 138 137  K 3.9 4.1 -- -- --  CL 95* 91* 91* 91* 91*  CO2 32 32 35* 39* 39*  GLUCOSE 119* 148* 127* 151* 154*  BUN 63* 59* 58* 59*  57*  CREATININE 1.62* 1.44* 1.57* 1.58* 1.40*  CALCIUM 9.3 9.2 9.3 9.6 9.4  MG -- -- -- -- 1.5  PHOS -- -- -- -- --   Liver Function Tests:  Lab 09/09/12 0620  AST 16  ALT 13  ALKPHOS 64  BILITOT 0.7  PROT 6.8  ALBUMIN 2.9*   No results found for this basename: LIPASE:5,AMYLASE:5 in the last 168 hours  Lab 09/07/12 1215  AMMONIA 32   CBC:  Lab 09/09/12 0620 09/08/12 0430 09/07/12 0500 09/06/12 0500  WBC 6.1 6.5 6.3 6.4  NEUTROABS 3.6 -- -- --  HGB 8.0* 8.6* 8.1* 8.7*  HCT 27.7* 29.6* 28.0* 29.8*  MCV 88.5 88.1 87.5 89.2  PLT 132* 145* 128* 135*   Cardiac Enzymes: No results found for this basename:  CKTOTAL:5,CKMB:5,CKMBINDEX:5,TROPONINI:5 in the last 168 hours BNP: No components found with this basename: POCBNP:5 CBG:  Lab 09/12/12 0711 09/11/12 2137 09/11/12 1603 09/11/12 1121 09/11/12 0727  GLUCAP 118* 160* 152* 146* 118*   Microbiology: Lab Results  Component Value Date   CULT PSEUDOMONAS AERUGINOSA 09/04/2012   CULT NO GROWTH 10/05/2011   CULT NO GROWTH 5 DAYS 10/05/2011   CULT NO GROWTH 5 DAYS 10/05/2011   CULT NO GROWTH 5 DAYS 06/25/2010   No results found for this basename: CULT:2,SDES:2 in the last 168 hours  Imaging: No results found.   ASSESSMENT:   1. A/C diastolic heart failure EF 55-60% RV moderately dilated/HK with RV-RA gradient 56 mmHg.  2. Right heart failure 3. A Fib 4. Hypothyroidism 5. Obesity 6. CAD 7. Hematoma 8. Acute renal failure (baseline creatinine 0.8-1.1): suspect cardiorenal  9. Hypokalemia 10. SubQ masses on lower back = hematomas (resolving) 11. LE cellulitis  PLAN/DISCUSSION  Still volume overloaded on exam.  Got only 1 dose of torsemide yesterday but net negative > 1000 cc.  Weight was done in bed today (others standing) so doubt accurate. Reweigh standing.  Creatinine/BUN rising incrementally.  Continue torsemide 40 mg bid for now with aggressive PT.   Appreciate rehab services.   [name] 09/12/2012 7:52 AM

## 2012-09-12 NOTE — Progress Notes (Signed)
ANTIBIOTIC CONSULT NOTE - FOLLOW UP  Pharmacy Consult for Vancomycin Indication: Lower extremity cellulitis  Allergies  Allergen Reactions  . Erythromycin Hives    Patient Measurements: Weight: 278 lb (126.1 kg)  Vital Signs: Temp: 97.7 F (36.5 C) (12/03 0525) Temp src: Oral (12/03 0525) BP: 109/70 mmHg (12/03 0525) Pulse Rate: 68  (12/03 0525) Intake/Output from previous day: 12/02 0701 - 12/03 0700 In: 1080 [P.O.:1080] Out: 2225 [Urine:2225] Intake/Output from this shift:    Labs:  Basename 09/12/12 0500 09/11/12 0830  WBC -- --  HGB -- --  PLT -- --  LABCREA -- --  CREATININE 1.62* 1.44*   The CrCl is unknown because both a height and weight (above a minimum accepted value) are required for this calculation.  Basename 09/12/12 0530  VANCOTROUGH 29.7*  VANCOPEAK --  Drue Dun --  GENTTROUGH --  GENTPEAK --  GENTRANDOM --  TOBRATROUGH --  TOBRAPEAK --  TOBRARND --  AMIKACINPEAK --  AMIKACINTROU --  AMIKACIN --     Microbiology: Recent Results (from the past 720 hour(s))  URINE CULTURE     Status: Normal   Collection Time   09/04/12  1:15 PM      Component Value Range Status Comment   Specimen Description URINE, CLEAN CATCH   Final    Special Requests NONE   Final    Culture  Setup Time 09/04/2012 13:37   Final    Colony Count >=100,000 COLONIES/ML   Final    Culture PSEUDOMONAS AERUGINOSA   Final    Report Status 09/06/2012 FINAL   Final    Organism ID, Bacteria PSEUDOMONAS AERUGINOSA   Final   MRSA PCR SCREENING     Status: Normal   Collection Time   09/05/12  3:29 PM      Component Value Range Status Comment   MRSA by PCR NEGATIVE  NEGATIVE Final     Anti-infectives     Start     Dose/Rate Route Frequency Ordered Stop   09/09/12 0600   vancomycin (VANCOCIN) 1,500 mg in sodium chloride 0.9 % 500 mL IVPB        1,500 mg 250 mL/hr over 120 Minutes Intravenous Every 24 hours 09/08/12 1558     09/08/12 2000   ciprofloxacin (CIPRO) tablet  500 mg        500 mg Oral 2 times daily 09/08/12 1558            Assessment: Mr. Mcgaha continues on Vancomycin for LLE cellulitis, which is chronic in nature. Noted plans are to continue Vancomycin through 12/5. Vancomycin trough is supratherapeutic and AM dose was given prior to level being reported back. SCr slowly rising, noted hx of CKD with baseline Scr 1.3-1.6 Also noted pseudomonas UTI, for which he has been receiving Cipro since 11/29. Noted he remains afebrile and WBC was nml on last check, 11/30.  11/25 urine cx>> >100k pseudomonas (Sens to Cipro) 11/26 MRSA PCR negative  Goal of Therapy:  Vancomycin trough level 10-15 mcg/ml  Plan:  - D/c current Vancomycin dosing, and start Vancomycin 750mg  IV q 24h tomorrow evening at 1800. - Please consider d/c Vancomycin after dose on 12/5 - Will monitor cx/spec/sens, renal fn and clinical status daily.  Thanks, Inez Rosato K. Allena Katz, PharmD, BCPS.  Clinical Pharmacist Pager [phone]. 09/12/2012 7:20 AM

## 2012-09-13 ENCOUNTER — Inpatient Hospital Stay (HOSPITAL_COMMUNITY): Payer: Medicare Other

## 2012-09-13 ENCOUNTER — Inpatient Hospital Stay (HOSPITAL_COMMUNITY): Payer: Medicare Other | Admitting: Occupational Therapy

## 2012-09-13 ENCOUNTER — Inpatient Hospital Stay (HOSPITAL_COMMUNITY): Payer: Medicare Other | Admitting: Physical Therapy

## 2012-09-13 DIAGNOSIS — I4891 Unspecified atrial fibrillation: Secondary | ICD-10-CM

## 2012-09-13 DIAGNOSIS — I5033 Acute on chronic diastolic (congestive) heart failure: Secondary | ICD-10-CM

## 2012-09-13 LAB — BASIC METABOLIC PANEL
CO2: 31 mEq/L (ref 19–32)
Chloride: 93 mEq/L — ABNORMAL LOW (ref 96–112)
Creatinine, Ser: 1.63 mg/dL — ABNORMAL HIGH (ref 0.50–1.35)
Potassium: 4.3 mEq/L (ref 3.5–5.1)

## 2012-09-13 LAB — GLUCOSE, CAPILLARY: Glucose-Capillary: 130 mg/dL — ABNORMAL HIGH (ref 70–99)

## 2012-09-13 MED ORDER — TORSEMIDE 20 MG PO TABS
40.0000 mg | ORAL_TABLET | Freq: Two times a day (BID) | ORAL | Status: DC
  Administered 2012-09-13 – 2012-09-17 (×10): 40 mg via ORAL
  Filled 2012-09-13 (×13): qty 2

## 2012-09-13 NOTE — Progress Notes (Signed)
Occupational Therapy Session Note  Patient Details  Name: [name] MRN: [id number] Date of Birth: 03-18-1946  Today's Date: 09/13/2012 Time: 4098-1191 Time Calculation (min): 44 min   Skilled Therapeutic Interventions/Progress Updates:    Worked on UE strengthening and endurance during session using UE ergonometer.  Performed 4 intervals of 3 mins alternating forward and backward during session.  Initial resistance set at level 8 with pt maintaining RPMs at 24-27 per minute.  Oxygen sats and HR checked after each set and maintained around 97-99% on 2 Ls nasal cannula.  Performed 2 sets at level 8 resistance and 2 sets at level 10 resistance.  Needed 2-3 minute rest breaks between each set.  Noted pt with clawing of the left hand, possibly related to ulnar nerve injury.  Pt with grip strength at 25 lbs on the left and 45 on the right. Returned back to room at end of the session.  Therapy Documentation Precautions:  Precautions Precautions: Fall Precaution Comments: on nasal canula Restrictions Weight Bearing Restrictions: No  Pain: Pain Assessment Pain Assessment: No/denies pain Pain Score:   8 Pain Type: Acute pain Pain Location: Leg Pain Orientation: Right;Left Pain Descriptors: Aching Pain Onset: Gradual Patients Stated Pain Goal: 3 Pain Intervention(s): Medication (See eMAR) ADL: See FIM for current functional status  Therapy/Group: Individual Therapy  Lamone Ferrelli OTR/L 09/13/2012, 3:48 PM

## 2012-09-13 NOTE — Progress Notes (Signed)
Patient ID: [name], male   DOB: 1946-02-12, 66 y.o.   MRN: [id number]  Subjective/Complaints: male with history of COPD, atrial fibrillation but not a candidate for Coumadin secondary to history alcohol use, CAD with coronary artery bypass grafting 2001, left lower extremity chronic hematoma and chronic diastolic CHF. Admitted 08/30/2012 with altered mental status and oxygen saturations 85% on room air. Findings of elevated creatinine 1.93. Cardiac enzymes were unremarkable. Chest x-ray completed showing moderate pulmonary vascular congestion suspect CHF. Echocardiogram with ejection fraction of 60% and moderate RV dilatation, moderate TR. Patient placed on intravenous Lasix with followup cardiology services monitored closely for volume overload. His volume status greatly improved and Lasix was changed to Demadex. Subcutaneous heparin added for DVT prophylaxis. Hospital course Pseudomonas UTI maintained on Cipro. Patient with multiple soft tissue masses on his back bilaterally primarily along the left flank hematoma and left leg that was chronic in nature. Noncontrast CT of abdomen and pelvis showed interval decrease in size of previously described right sided presumed subcutaneous hematomas. Noted increasing cellulitic changes lower tremor to his questionable cellulitis intravenous vancomycin was initiated 09/08/2012 x7 days. There was incidental finding of new acute/subacute L1 transverse process fracture which was discussed with neurosurgery Dr. Phoebe Perch and advise conservative care and did not need outpatient followup with neurosurgery.    No breathing problems, uses O2 at home  Objective: Vital Signs: Blood pressure 126/68, pulse 68, temperature 97.5 F (36.4 C), temperature source Oral, resp. rate 18, weight 124.7 kg (274 lb 14.6 oz), SpO2 98.00%. No results found. Results for orders placed during the hospital encounter of 09/08/12 (from the past 72 hour(s))  GLUCOSE, CAPILLARY     Status:  Abnormal   Collection Time   09/10/12 11:33 AM      Component Value Range Comment   Glucose-Capillary 154 (*) 70 - 99 mg/dL    Comment 1 Notify RN     GLUCOSE, CAPILLARY     Status: Abnormal   Collection Time   09/10/12  4:11 PM      Component Value Range Comment   Glucose-Capillary 132 (*) 70 - 99 mg/dL    Comment 1 Notify RN     GLUCOSE, CAPILLARY     Status: Abnormal   Collection Time   09/10/12  8:19 PM      Component Value Range Comment   Glucose-Capillary 209 (*) 70 - 99 mg/dL    Comment 1 Notify RN     GLUCOSE, CAPILLARY     Status: Abnormal   Collection Time   09/11/12  7:27 AM      Component Value Range Comment   Glucose-Capillary 118 (*) 70 - 99 mg/dL    Comment 1 Notify RN     BASIC METABOLIC PANEL     Status: Abnormal   Collection Time   09/11/12  8:30 AM      Component Value Range Comment   Sodium 133 (*) 135 - 145 mEq/L    Potassium 4.1  3.5 - 5.1 mEq/L    Chloride 91 (*) 96 - 112 mEq/L    CO2 32  19 - 32 mEq/L    Glucose, Bld 148 (*) 70 - 99 mg/dL    BUN 59 (*) 6 - 23 mg/dL    Creatinine, Ser 4.09 (*) 0.50 - 1.35 mg/dL    Calcium 9.2  8.4 - 81.1 mg/dL    GFR calc non Af Amer 48 (*) >90 mL/min    GFR calc Af Amer 56 (*) >  90 mL/min   GLUCOSE, CAPILLARY     Status: Abnormal   Collection Time   09/11/12 11:21 AM      Component Value Range Comment   Glucose-Capillary 146 (*) 70 - 99 mg/dL    Comment 1 Notify RN     GLUCOSE, CAPILLARY     Status: Abnormal   Collection Time   09/11/12  4:03 PM      Component Value Range Comment   Glucose-Capillary 152 (*) 70 - 99 mg/dL    Comment 1 Notify RN     GLUCOSE, CAPILLARY     Status: Abnormal   Collection Time   09/11/12  9:37 PM      Component Value Range Comment   Glucose-Capillary 160 (*) 70 - 99 mg/dL   BASIC METABOLIC PANEL     Status: Abnormal   Collection Time   09/12/12  5:00 AM      Component Value Range Comment   Sodium 134 (*) 135 - 145 mEq/L    Potassium 3.9  3.5 - 5.1 mEq/L    Chloride 95 (*) 96 - 112  mEq/L    CO2 32  19 - 32 mEq/L    Glucose, Bld 119 (*) 70 - 99 mg/dL    BUN 63 (*) 6 - 23 mg/dL    Creatinine, Ser 9.56 (*) 0.50 - 1.35 mg/dL    Calcium 9.3  8.4 - 21.3 mg/dL    GFR calc non Af Amer 42 (*) >90 mL/min    GFR calc Af Amer 49 (*) >90 mL/min   VANCOMYCIN, TROUGH     Status: Abnormal   Collection Time   09/12/12  5:30 AM      Component Value Range Comment   Vancomycin Tr 29.7 (*) 10.0 - 20.0 ug/mL   GLUCOSE, CAPILLARY     Status: Abnormal   Collection Time   09/12/12  7:11 AM      Component Value Range Comment   Glucose-Capillary 118 (*) 70 - 99 mg/dL    Comment 1 Notify RN     GLUCOSE, CAPILLARY     Status: Abnormal   Collection Time   09/12/12 11:29 AM      Component Value Range Comment   Glucose-Capillary 131 (*) 70 - 99 mg/dL    Comment 1 Notify RN     GLUCOSE, CAPILLARY     Status: Abnormal   Collection Time   09/12/12  4:40 PM      Component Value Range Comment   Glucose-Capillary 115 (*) 70 - 99 mg/dL    Comment 1 Notify RN     GLUCOSE, CAPILLARY     Status: Abnormal   Collection Time   09/12/12  9:07 PM      Component Value Range Comment   Glucose-Capillary 183 (*) 70 - 99 mg/dL   BASIC METABOLIC PANEL     Status: Abnormal   Collection Time   09/13/12  6:05 AM      Component Value Range Comment   Sodium 132 (*) 135 - 145 mEq/L    Potassium 4.3  3.5 - 5.1 mEq/L    Chloride 93 (*) 96 - 112 mEq/L    CO2 31  19 - 32 mEq/L    Glucose, Bld 119 (*) 70 - 99 mg/dL    BUN 66 (*) 6 - 23 mg/dL    Creatinine, Ser 0.86 (*) 0.50 - 1.35 mg/dL    Calcium 9.4  8.4 - 57.8 mg/dL    GFR  calc non Af Amer 42 (*) >90 mL/min    GFR calc Af Amer 48 (*) >90 mL/min   GLUCOSE, CAPILLARY     Status: Abnormal   Collection Time   09/13/12  7:07 AM      Component Value Range Comment   Glucose-Capillary 119 (*) 70 - 99 mg/dL    Comment 1 Notify RN        HEENT: normal Cardio: irregular Resp: CTA B/L GI: BS positive Extremity:  Edema bilateral pretibial 2+ Skin:   Other  stasis dermatitis bilateral pretib, no erythema Neuro: Alert/Oriented, Abnormal Sensory diminished LT in feet, Abnormal Motor 4/5 in BUEs and RLE, 3-/5 in LLE and Other Hearing impaired Musc/Skel:  LB tender and Other Left lateral hip tenderness, also mild pain with L hip ROM Gen NAD   Assessment/Plan: 1. Functional deficits secondary to Decondtioning acute on chronic diastolic CHF which require 3+ hours per day of interdisciplinary therapy in a comprehensive inpatient rehab setting. Physiatrist is providing close team supervision and 24 hour management of active medical problems listed below. Physiatrist and rehab team continue to assess barriers to discharge/monitor patient progress toward functional and medical goals. FIM: FIM - Bathing Bathing Steps Patient Completed: Chest;Right Arm;Left Arm;Abdomen;Right upper leg;Left upper leg;Right lower leg (including foot);Left lower leg (including foot) (with long sponge) Bathing: 4: Min-Patient completes 8-9 68f 10 parts or 75+ percent  FIM - Upper Body Dressing/Undressing Upper body dressing/undressing steps patient completed: Thread/unthread right sleeve of pullover shirt/dresss;Thread/unthread left sleeve of pullover shirt/dress;Put head through opening of pull over shirt/dress Upper body dressing/undressing: 0: Wears gown/pajamas-no public clothing FIM - Lower Body Dressing/Undressing Lower body dressing/undressing: 0: Wears Oceanographer  FIM - Toileting Toileting: 1: Total-Patient completed zero steps, helper did all 3  FIM - Diplomatic Services operational officer Devices: Building control surveyor Transfers: 3-To toilet/BSC: Mod A (lift or lower assist);4-From toilet/BSC: Min A (steadying Pt. > 75%)  FIM - Bed/Chair Transfer Bed/Chair Transfer Assistive Devices: Therapist, occupational: 4: Supine > Sit: Min A (steadying Pt. > 75%/lift 1 leg);4: Bed > Chair or W/C: Min A (steadying Pt. > 75%)  FIM -  Locomotion: Wheelchair Distance: 25 Locomotion: Wheelchair: 1: Total Assistance/staff pushes wheelchair (Pt<25%) FIM - Locomotion: Ambulation Locomotion: Ambulation Assistive Devices: Designer, industrial/product Ambulation/Gait Assistance: 4: Min assist Locomotion: Ambulation: 1: Travels less than 50 ft with minimal assistance (Pt.>75%)  Comprehension Comprehension Mode: Auditory Comprehension: 6-Follows complex conversation/direction: With extra time/assistive device  Expression Expression Mode: Verbal Expression: 5-Expresses basic needs/ideas: With extra time/assistive device  Social Interaction Social Interaction: 5-Interacts appropriately 90% of the time - Needs monitoring or encouragement for participation or interaction.  Problem Solving Problem Solving: 5-Solves basic 90% of the time/requires cueing < 10% of the time  Memory Memory Assistive Devices: Memory book Memory: 4-Recognizes or recalls 75 - 89% of the time/requires cueing 10 - 24% of the time  Medical Problem List and Plan:  1. Deconditioning related to CHF weight reduce from 127kg- 124kg and multi-medical, toxic encephalopathy  2. DVT Prophylaxis/Anticoagulation: Subcutaneous heparin. Monitor platelet counts any signs of bleeding  3. Pain Management: Neurontin 300 mg daily, Percocet as needed. Monitor mental status  4. Mood/delirium/depression. Prozac 20 mg daily. Provide emotional support and positive reinforcement  5. Neuropsych: This patient is capable of making decisions on his/her own behalf.  6. Hypertension/atrial fibrillation. Coreg 6.25 mg twice a day, Aldactone 25 mg daily, Demadex 40 mg daily. Monitor with increased mobility. No plans for chronic anti-coagulation secondary  to fall risk and history of alcohol use  7. Pseudomonas UTI. Continue Cipro x7 days  8. Subcutaneous masses on lower back and lower tremor the felt to be chronic in nature. We'll continue to provide conservative care  9. Question cellulitis lower  extremities. Vancomycin initiate 09/08/2012 x7 days. Monitor skin closely  10. Diabetes mellitus. Hemoglobin A1c of 5.8. Lantus insulin 10 units daily. Check blood sugars a.c. and at bedtime  11. Incidental finding of L1 transverse process fracture acute versus subacute. Conservative care advised as per neurosurgery Dr. Phoebe Perch  12. Hypothyroidism. Synthroid  13. Hyperlipidemia. Zocor  14. Chronic renal insufficiency. Creatinine baseline 1.27-1.58. Followup chemistries  15. COPD. Check oxygen saturations every shift    LOS (Days) 5 A FACE TO FACE EVALUATION WAS PERFORMED  KIRSTEINS,ANDREW E 09/13/2012, 9:35 AM

## 2012-09-13 NOTE — Progress Notes (Signed)
Advanced Heart Failure Rounding Note  Subjective:  [name] is a 66 yo male with PMX s/f CAD (s/p CABG x 4 in 2001, normal Myoview 2010), chronic diastolic CHF, persistent a-fib, DM, HTN, HL, carotid artery disease, COPD, hypothyroidism, EtOH abuse, chronic venous insufficiency and morbid obesity.  He is not anticoagulants due to ETOH abuse and fall risk.   Echo 09/2011: LVEF 45-50%, mod biatral enlargement, moderate RV dilatation, moderate TR, PASP 66 mmHg.  ECHO 08/2012 EF 55-60% moderate bilateral RA/LA enlargement  Admitted 08/30/12 for acute on chronic diastolic CHF. He has been diuresed with IV lasix 40 mg bid.   Admit weight 278 but trending up to 282 pounds. After reviewing office note weights from the last 6 months his weight was 240-250 pounds.   Lasix drip stopped 08/2812. 09/07/12 CT showed multiple resolving SQ hematomas. D/C from acute care 09/08/12 to acute rehab weight at that time was 264 pounds. Switched to Toresemide 40 mg daily.    Cr 1.17->1.4->1.5> 1.57>1.44>1.62 >1.63  Denies SOB/PND/Orthopnea   Intake/Output Summary (Last 24 hours) at 09/13/12 0832 Last data filed at 09/13/12 0602  Gross per 24 hour  Intake    870 ml  Output   1550 ml  Net   -680 ml    Current meds:    . [COMPLETED] alteplase  2 mg Intracatheter Once  . aspirin  325 mg Oral Daily  . carvedilol  6.25 mg Oral BID WC  . ciprofloxacin  500 mg Oral BID  . FLUoxetine  20 mg Oral Daily  . folic acid  1 mg Oral Daily  . gabapentin  300 mg Oral Daily  . heparin subcutaneous  5,000 Units Subcutaneous Q8H  . insulin aspart  0-9 Units Subcutaneous TID WC  . insulin glargine  10 Units Subcutaneous QHS  . lactulose  30 g Oral Daily  . levothyroxine  150 mcg Oral QAC breakfast  . multivitamin with minerals  1 tablet Oral Daily  . potassium chloride  40 mEq Oral BID  . simvastatin  40 mg Oral QHS  . spironolactone  25 mg Oral Daily  . torsemide  40 mg Oral BID  . vancomycin  750 mg Intravenous  Q24H  . [DISCONTINUED] torsemide  40 mg Oral Daily   Infusions:     Objective:  Blood pressure 121/54, pulse 68, temperature 97.5 F (36.4 C), temperature source Oral, resp. rate 18, weight 278 lb (126.1 kg), SpO2 98.00%. Weight change: -30 lb 6.4 oz (-13.789 kg)  Physical Exam: General:  Chronically ill. No resp difficulty HEENT: normal Neck: supple. JVP 10-11 Carotids 2+ bilat; no bruits. No lymphadenopathy or thryomegaly appreciated. Cor: PMI nondisplaced. Regular rate & rhythm. No rubs, gallops. 1/6 SEM RUSB. Lungs: Crackles at bases bilaterally Abdomen: obese soft, nontender, nondistended. No hepatosplenomegaly. No bruits or masses. Good bowel sounds. Large irregular subQ masses on back Extremities: no cyanosis, clubbing, rash, R and LLE  pale pink 1+ edema  . Diffuse erythema concerning for cellulitis Neuro: alert & orientedx3, cranial nerves grossly intact. moves all 4 extremities w/o difficulty. Affect pleasant   Basic Metabolic Panel:  Lab 09/13/12 1610 09/12/12 0500 09/11/12 0830 09/09/12 0620 09/08/12 0430 09/07/12 0500  NA 132* 134* 133* 136 138 --  K 4.3 3.9 -- -- -- --  CL 93* 95* 91* 91* 91* --  CO2 31 32 32 35* 39* --  GLUCOSE 119* 119* 148* 127* 151* --  BUN 66* 63* 59* 58* 59* --  CREATININE 1.63* 1.62* 1.44*  1.57* 1.58* --  CALCIUM 9.4 9.3 9.2 9.3 9.6 --  MG -- -- -- -- -- 1.5  PHOS -- -- -- -- -- --   Liver Function Tests:  Lab 09/09/12 0620  AST 16  ALT 13  ALKPHOS 64  BILITOT 0.7  PROT 6.8  ALBUMIN 2.9*   No results found for this basename: LIPASE:5,AMYLASE:5 in the last 168 hours  Lab 09/07/12 1215  AMMONIA 32   CBC:  Lab 09/09/12 0620 09/08/12 0430 09/07/12 0500  WBC 6.1 6.5 6.3  NEUTROABS 3.6 -- --  HGB 8.0* 8.6* 8.1*  HCT 27.7* 29.6* 28.0*  MCV 88.5 88.1 87.5  PLT 132* 145* 128*   Cardiac Enzymes: No results found for this basename: CKTOTAL:5,CKMB:5,CKMBINDEX:5,TROPONINI:5 in the last 168 hours BNP: No components found with  this basename: POCBNP:5 CBG:  Lab 09/13/12 0707 09/12/12 2107 09/12/12 1640 09/12/12 1129 09/12/12 0711  GLUCAP 119* 183* 115* 131* 118*   Microbiology: Lab Results  Component Value Date   CULT PSEUDOMONAS AERUGINOSA 09/04/2012   CULT NO GROWTH 10/05/2011   CULT NO GROWTH 5 DAYS 10/05/2011   CULT NO GROWTH 5 DAYS 10/05/2011   CULT NO GROWTH 5 DAYS 06/25/2010   No results found for this basename: CULT:2,SDES:2 in the last 168 hours  Imaging: No results found.   ASSESSMENT:   1. A/C diastolic heart failure EF 55-60% RV moderately dilated/HK with RV-RA gradient 56 mmHg.  2. Right heart failure 3. A Fib 4. Hypothyroidism 5. Obesity 6. CAD 7. Hematoma 8. Acute renal failure (baseline creatinine 0.8-1.1): suspect cardiorenal  9. Hypokalemia 10. SubQ masses on lower back = hematomas (resolving) 11. LE cellulitis  PLAN/DISCUSSION  Volume status elevated. Did not get torsemide twice yesterday.  Will give torsemide to 40 mg bid today and reassess volume in am.  I/Os not accurate. Weight not accurate.   Renal Creatinine/BUN stable.     Appreciate rehab services.   [name] 09/13/2012 8:32 AM

## 2012-09-13 NOTE — Patient Care Conference (Signed)
Inpatient RehabilitationTeam Conference Note Date: 09/13/2012   Time: 11:15 AM    Patient Name: [name]      Medical Record Number: [id number]  Date of Birth: 1945-11-08 Sex: Male         Room/Bed: 4144/4144-01 Payor Info: Payor: MEDICARE  Plan: MEDICARE PART A AND B  Product Type: *No Product type*     Admitting Diagnosis: CHF,DECOND  Admit Date/Time:  09/08/2012  3:43 PM Admission Comments: No comment available   Primary Diagnosis:  Physical deconditioning Principal Problem: Physical deconditioning  Patient Active Problem List   Diagnosis Date Noted  . Physical deconditioning 09/11/2012  . L1 vertebral fracture 09/07/2012  . Hypokalemia 09/06/2012  . Pseudomonas urinary tract infection 09/05/2012  . Acute renal failure 08/30/2012  . Weakness generalized 08/30/2012  . Hypothyroidism 08/30/2012  . Acute respiratory failure with hypoxia 08/30/2012  . Elevated troponin 08/30/2012  . Elevated LFTs 08/30/2012  . Acute on chronic diastolic CHF (congestive heart failure) 08/30/2012  . Hematoma-on back, sacral area, right chest wall post fall 08/28/2012  . Renal insufficiency 10/06/2011  . NSTEMI (non-ST elevated myocardial infarction) 10/06/2011  . ETOH abuse   . Atrial fibrillation   . Dyspnea 09/14/2011  . DIASTOLIC HEART FAILURE, CHRONIC 12/23/2009  . EDEMA 06/24/2009  . DM 06/20/2009  . HYPERLIPIDEMIA 06/20/2009  . OBSTRUCTIVE SLEEP APNEA 06/20/2009  . HYPERTENSION 06/20/2009  . CAROTID STENOSIS 06/20/2009  . CHRONIC OBSTRUCTIVE PULMONARY DISEASE 06/20/2009  . CAD 06/20/2009    Expected Discharge Date: Expected Discharge Date: 09/22/12  Team Members Present: Physician: Dr. Claudette Laws Social Worker Present: Dossie Der, LCSW Nurse Present: Other (comment) Milly Jakob Rcom-RN) PT Present: Wanda Plump, PT;Other (comment) Clarisse Gouge Ripa-PT) OT Present: Leonette Monarch, OT SLP Present: Fae Pippin, SLP Other (Discipline and Name): Charolette Child  Coordinator     Current Status/Progress Goal Weekly Team Focus  Medical   poor endurance glut weakness  improve hip extensor strength and endurance  see above   Bowel/Bladder   Continent of bowel and bladder. Staff assist holding the urinals each time the patient void.LBM 12-1.  Remain continent of bowel and bladder and to be independent of holding urinals each time he void.To have a regular BM on a daily basis.  Time toileting q 2 hrs.   Swallow/Nutrition/ Hydration             ADL's   total assist with toileting, LB dressing; mod assist with bathing; mod assist with transfers with RW to BSC  min assist with bathing, LB dressing, toileting, tub bench transfers; supervision with toilet transfers  functional transfers, standing balance, AE training, pt/ family education   Mobility   +2 assist for sit to stand; minimal assistance short distances once on feet  supervision to minimal assistance with mobility  sit to stand; hip strengthening   Communication             Safety/Cognition/ Behavioral Observations            Pain   Pain to lower back and bilateral lower extremities relieved by 2 tablets of Percocet 5/325 mg  Pain level less than 3 or less on a scale of 0-10.  Assess effectiveness of intervention and modify as needed.   Skin   Scabs noted to bilateral knees and lower extremities with allevyn on the right knee. Marland Kitchen BLE discolored with celulitis and edema present  .Hematoma at the lower back. Small cut to left ear,  patient states " I scratch  it"  bandaid applied.   .  No new skin breakdown or infection.  Assess and monitor any skin breakdown  q shift.      *See Interdisciplinary Assessment and Plan and progress notes for long and short-term goals  Barriers to Discharge: ? wife can assist at home    Possible Resolutions to Barriers:  Establish care giver abilities    Discharge Planning/Teaching Needs:  Home with daughter and son in-law, someone is always there      Team  Discussion:  Making progress, measuring doorways at home for PT.  Sit to stand the most difficult for him.  Deconditioned and poor endurance.  Revisions to Treatment Plan:  NOne   Continued Need for Acute Rehabilitation Level of Care: The patient requires daily medical management by a physician with specialized training in physical medicine and rehabilitation for the following conditions: Daily direction of a multidisciplinary physical rehabilitation program to ensure safe treatment while eliciting the highest outcome that is of practical value to the patient.: Yes Daily medical management of patient stability for increased activity during participation in an intensive rehabilitation regime.: Yes Daily analysis of laboratory values and/or radiology reports with any subsequent need for medication adjustment of medical intervention for : Other  Lucy Chris 09/13/2012, 2:26 PM

## 2012-09-13 NOTE — Progress Notes (Addendum)
Occupational Therapy Session Note  Patient Details  Name: [name] MRN: [id number] Date of Birth: [birth year]  Today's Date: 09/13/2012 Time: 0805-0905 and 1100-1130 Time Calculation (min): 60 min and 30 min  Short Term Goals: No short term goals set  Skilled Therapeutic Interventions/Progress Updates:    Visit 1: Pt seen for bathing and functional mobility skills. He continues to decline to don a shirt and pants and wants to wear the hospital gowns due to urinary frequency.  Therapy focused on standing balance and tolerance. Pt did extremely well moving from sit to stand numerous times with only min assist.  He walked with close supervision to the sink and stood for several minutes for bathing.  Pt used long sponge and was able to reach all areas except perineal and buttock area.  Improving activity tolerance.   Visit 2: Pt seen for endurance training activities with UE AROM, trunk strengthening activities, LE AROM, balance, mini-squats, ambulation to restroom. Pt completed toilet transfers with min assist.  Assist for clean up.  Therapy Documentation Precautions:  Precautions Precautions: Fall Precaution Comments: on nasal canula Restrictions Weight Bearing Restrictions: No    Vital Signs: Therapy Vitals Pulse Rate: 68  BP: 126/68 mmHg Pain: Pain Assessment Pain Assessment: No/denies pain ADL:   See FIM for current functional status  Therapy/Group: Individual Therapy  Cydni Reddoch 09/13/2012, 9:23 AM

## 2012-09-13 NOTE — Progress Notes (Signed)
Social Work Patient ID: [name], male   DOB: 02/04/1946, 66 y.o.   MRN: [id number] Met with pt and spoke with daughter via telephone to inform of team conference goals-supervision/min level and discharge 12/13. Daughter reports she was helping him PTA and will continue.  Scheduled family education for Friday 12/6 at 11:00.  Pt reports he has seen some progress But needs to make more.  He is very blunt and straight forward, daughter reports he has always been like this.

## 2012-09-13 NOTE — Progress Notes (Signed)
Physical Therapy Note  Patient Details  Name: MASSIMILIANO ROHLEDER MRN: [id number] Date of Birth: 04/25/1946 Today's Date: 09/13/2012  1000-1055 (55 minutes) individual Pain: 5/10 Left LE/premedicated  Oxygen sats 96% 2 L [place] pulse 60 Focus of treatment: therapeutic exercises for bilateral LE strengthening/activity tolerance; gait training Treatment: wc mobility- 50 feet mod assist (pt has difficulty steering); transfers min assist for sit to stand to RW; stand to sit min assist with vcs to use arms for assist; Nustep Level 3   X 3 minutes (Oxygen sats 86-88 % pulse 74 with vcs for deep breaths ); X 2 additional minutes; Gait 60 feet X 1 RW min assist; sit to stand 2 X 5 from raised mat min assist (oxygen sats 88% recovering to > 92% with deep breathing); standing - pt requires assist to prevent loss of balance posteriorly in static standing with wide BOS.     Ramanda Paules,JIM 09/13/2012, 7:37 AM

## 2012-09-13 NOTE — Progress Notes (Signed)
Physical Therapy Session Note  Patient Details  Name: [name] MRN: [id number] Date of Birth: May 05, 1946  Today's Date: 09/13/2012 Time: : 1135-1205 Time Calculation (min): 30 min  Short Term Goals: Week 1:  PT Short Term Goal 1 (Week 1): Pt will transfer bed>< w/c or recliner with assistance of 1 person. PT Short Term Goal 2 (Week 1): Pt will propel w/c x 50' with supervision, for general activity tolerance. PT Short Term Goal 3 (Week 1): Pt will perform gait x 20' with LRAD and assistance of 1 person. PT Short Term Goal 4 (Week 1): Pt will stand x 5 minutes during functional activity.     Skilled Therapeutic Interventions/Progress Updates:     Sit> stand from w/c at 20.5" height, mod assist to RW, x 2.  Therapeutic exercise performed in standing with RW, with bil LEs to increase strength for functional mobility:  10 x 1 each- - bil shoulder adduction to promote back extension -calf raises (,minimal excursion) -mini-squats with R or L foot ahead in modified tandem position   Gait training with RW x 80' including 2 turns, min assist, VCs for upright posture.  Pt c/o L hip lateral pain after ambulating.  Pt called son-in-law to measure doorways at home, because he is concerned about w/c accessibility. Therapy Documentation Precautions:  Precautions Precautions: Fall Precaution Comments: on nasal canula Restrictions Weight Bearing Restrictions: No   Vital Signs: HR 67 before and after ambulating; O2 sats 96% before , 85% after ambulating, in sitting, on 2 L O2 via [place] Pain: Pain Assessment Pain Assessment:5/10 bil hips, L>R; premedicated      See FIM for current functional status  Therapy/Group: Individual Therapy  Obediah Welles 09/13/2012, 12:18 PM

## 2012-09-14 ENCOUNTER — Inpatient Hospital Stay (HOSPITAL_COMMUNITY): Payer: Medicare Other | Admitting: Physical Therapy

## 2012-09-14 ENCOUNTER — Inpatient Hospital Stay (HOSPITAL_COMMUNITY): Payer: Medicare Other | Admitting: Occupational Therapy

## 2012-09-14 ENCOUNTER — Inpatient Hospital Stay (HOSPITAL_COMMUNITY): Payer: Medicare Other

## 2012-09-14 LAB — BASIC METABOLIC PANEL
BUN: 66 mg/dL — ABNORMAL HIGH (ref 6–23)
BUN: 63 mg/dL — ABNORMAL HIGH (ref 6–23)
CO2: 31 mEq/L (ref 19–32)
CO2: 32 mEq/L (ref 19–32)
Calcium: 9.5 mg/dL (ref 8.4–10.5)
Chloride: 95 mEq/L — ABNORMAL LOW (ref 96–112)
Creatinine, Ser: 1.44 mg/dL — ABNORMAL HIGH (ref 0.50–1.35)
GFR calc non Af Amer: 48 mL/min — ABNORMAL LOW (ref >90)
Glucose, Bld: 117 mg/dL — ABNORMAL HIGH (ref 70–99)
Glucose, Bld: 119 mg/dL — ABNORMAL HIGH (ref 70–99)
Potassium: 4.1 mEq/L (ref 3.5–5.1)
Sodium: 136 mEq/L (ref 135–145)
Sodium: 137 mEq/L (ref 135–145)

## 2012-09-14 LAB — GLUCOSE, CAPILLARY: Glucose-Capillary: 132 mg/dL — ABNORMAL HIGH (ref 70–99)

## 2012-09-14 NOTE — Progress Notes (Signed)
Patient ID: [name], male   DOB: 05-25-1946, 66 y.o.   MRN: [id number]  Subjective/Complaints: male with history of COPD, atrial fibrillation but not a candidate for Coumadin secondary to history alcohol use, CAD with coronary artery bypass grafting 2001, left lower extremity chronic hematoma and chronic diastolic CHF. Admitted 08/30/2012 with altered mental status and oxygen saturations 85% on room air. Findings of elevated creatinine 1.93. Cardiac enzymes were unremarkable. Chest x-ray completed showing moderate pulmonary vascular congestion suspect CHF. Echocardiogram with ejection fraction of 60% and moderate RV dilatation, moderate TR. Patient placed on intravenous Lasix with followup cardiology services monitored closely for volume overload. His volume status greatly improved and Lasix was changed to Demadex. Subcutaneous heparin added for DVT prophylaxis. Hospital course Pseudomonas UTI maintained on Cipro. Patient with multiple soft tissue masses on his back bilaterally primarily along the left flank hematoma and left leg that was chronic in nature. Noncontrast CT of abdomen and pelvis showed interval decrease in size of previously described right sided presumed subcutaneous hematomas. Noted increasing cellulitic changes lower tremor to his questionable cellulitis intravenous vancomycin was initiated 09/08/2012 x7 days. There was incidental finding of new acute/subacute L1 transverse process fracture which was discussed with neurosurgery Dr. Phoebe Perch and advise conservative care and did not need outpatient followup with neurosurgery.    "put out a lot of fluid yesterday" L hip pain  Objective: Vital Signs: Blood pressure 115/67, pulse 69, temperature 97.5 F (36.4 C), temperature source Oral, resp. rate 19, weight 123 kg (271 lb 2.7 oz), SpO2 95.00%. No results found. Results for orders placed during the hospital encounter of 09/08/12 (from the past 72 hour(s))  GLUCOSE, CAPILLARY      Status: Abnormal   Collection Time   09/11/12 11:21 AM      Component Value Range Comment   Glucose-Capillary 146 (*) 70 - 99 mg/dL    Comment 1 Notify RN     GLUCOSE, CAPILLARY     Status: Abnormal   Collection Time   09/11/12  4:03 PM      Component Value Range Comment   Glucose-Capillary 152 (*) 70 - 99 mg/dL    Comment 1 Notify RN     GLUCOSE, CAPILLARY     Status: Abnormal   Collection Time   09/11/12  9:37 PM      Component Value Range Comment   Glucose-Capillary 160 (*) 70 - 99 mg/dL   BASIC METABOLIC PANEL     Status: Abnormal   Collection Time   09/12/12  5:00 AM      Component Value Range Comment   Sodium 134 (*) 135 - 145 mEq/L    Potassium 3.9  3.5 - 5.1 mEq/L    Chloride 95 (*) 96 - 112 mEq/L    CO2 32  19 - 32 mEq/L    Glucose, Bld 119 (*) 70 - 99 mg/dL    BUN 63 (*) 6 - 23 mg/dL    Creatinine, Ser 4.09 (*) 0.50 - 1.35 mg/dL    Calcium 9.3  8.4 - 81.1 mg/dL    GFR calc non Af Amer 42 (*) >90 mL/min    GFR calc Af Amer 49 (*) >90 mL/min   VANCOMYCIN, TROUGH     Status: Abnormal   Collection Time   09/12/12  5:30 AM      Component Value Range Comment   Vancomycin Tr 29.7 (*) 10.0 - 20.0 ug/mL   GLUCOSE, CAPILLARY     Status: Abnormal  Collection Time   09/12/12  7:11 AM      Component Value Range Comment   Glucose-Capillary 118 (*) 70 - 99 mg/dL    Comment 1 Notify RN     GLUCOSE, CAPILLARY     Status: Abnormal   Collection Time   09/12/12 11:29 AM      Component Value Range Comment   Glucose-Capillary 131 (*) 70 - 99 mg/dL    Comment 1 Notify RN     GLUCOSE, CAPILLARY     Status: Abnormal   Collection Time   09/12/12  4:40 PM      Component Value Range Comment   Glucose-Capillary 115 (*) 70 - 99 mg/dL    Comment 1 Notify RN     GLUCOSE, CAPILLARY     Status: Abnormal   Collection Time   09/12/12  9:07 PM      Component Value Range Comment   Glucose-Capillary 183 (*) 70 - 99 mg/dL   BASIC METABOLIC PANEL     Status: Abnormal   Collection Time   09/13/12   6:05 AM      Component Value Range Comment   Sodium 132 (*) 135 - 145 mEq/L    Potassium 4.3  3.5 - 5.1 mEq/L    Chloride 93 (*) 96 - 112 mEq/L    CO2 31  19 - 32 mEq/L    Glucose, Bld 119 (*) 70 - 99 mg/dL    BUN 66 (*) 6 - 23 mg/dL    Creatinine, Ser 1.61 (*) 0.50 - 1.35 mg/dL    Calcium 9.4  8.4 - 09.6 mg/dL    GFR calc non Af Amer 42 (*) >90 mL/min    GFR calc Af Amer 48 (*) >90 mL/min   GLUCOSE, CAPILLARY     Status: Abnormal   Collection Time   09/13/12  7:07 AM      Component Value Range Comment   Glucose-Capillary 119 (*) 70 - 99 mg/dL    Comment 1 Notify RN     GLUCOSE, CAPILLARY     Status: Abnormal   Collection Time   09/13/12 11:35 AM      Component Value Range Comment   Glucose-Capillary 130 (*) 70 - 99 mg/dL    Comment 1 Notify RN     GLUCOSE, CAPILLARY     Status: Abnormal   Collection Time   09/13/12  4:31 PM      Component Value Range Comment   Glucose-Capillary 124 (*) 70 - 99 mg/dL    Comment 1 Notify RN     GLUCOSE, CAPILLARY     Status: Abnormal   Collection Time   09/13/12  8:40 PM      Component Value Range Comment   Glucose-Capillary 191 (*) 70 - 99 mg/dL    Comment 1 Notify RN     BASIC METABOLIC PANEL     Status: Abnormal   Collection Time   09/14/12  5:17 AM      Component Value Range Comment   Sodium 136  135 - 145 mEq/L    Potassium 4.3  3.5 - 5.1 mEq/L    Chloride 95 (*) 96 - 112 mEq/L    CO2 31  19 - 32 mEq/L    Glucose, Bld 117 (*) 70 - 99 mg/dL    BUN 66 (*) 6 - 23 mg/dL    Creatinine, Ser 0.45 (*) 0.50 - 1.35 mg/dL    Calcium 9.5  8.4 - 40.9 mg/dL  GFR calc non Af Amer 48 (*) >90 mL/min    GFR calc Af Amer 56 (*) >90 mL/min   GLUCOSE, CAPILLARY     Status: Abnormal   Collection Time   09/14/12  7:13 AM      Component Value Range Comment   Glucose-Capillary 114 (*) 70 - 99 mg/dL    Comment 1 Notify RN        HEENT: normal Cardio: irregular Resp: CTA B/L GI: BS positive Extremity:  Edema bilateral pretibial 2+ Skin:   Other  stasis dermatitis bilateral pretib, no erythema Neuro: Alert/Oriented, Abnormal Sensory diminished LT in feet, Abnormal Motor 4/5 in BUEs and RLE, 3-/5 in LLE and Other Hearing impaired Musc/Skel:  LB tender and Other Left lateral hip tenderness, also mild pain with L hip ROM Gen NAD   Assessment/Plan: 1. Functional deficits secondary to Decondtioning acute on chronic diastolic CHF which require 3+ hours per day of interdisciplinary therapy in a comprehensive inpatient rehab setting. Physiatrist is providing close team supervision and 24 hour management of active medical problems listed below. Physiatrist and rehab team continue to assess barriers to discharge/monitor patient progress toward functional and medical goals. FIM: FIM - Bathing Bathing Steps Patient Completed: Chest;Right Arm;Left Arm;Abdomen;Right upper leg;Left upper leg;Right lower leg (including foot);Left lower leg (including foot) (with long sponge) Bathing: 4: Min-Patient completes 8-9 19f 10 parts or 75+ percent  FIM - Upper Body Dressing/Undressing Upper body dressing/undressing steps patient completed: Thread/unthread right sleeve of pullover shirt/dresss;Thread/unthread left sleeve of pullover shirt/dress;Put head through opening of pull over shirt/dress Upper body dressing/undressing: 0: Wears gown/pajamas-no public clothing FIM - Lower Body Dressing/Undressing Lower body dressing/undressing: 0: Wears Oceanographer  FIM - Toileting Toileting: 1: Total-Patient completed zero steps, helper did all 3  FIM - Diplomatic Services operational officer Devices: Building control surveyor Transfers: 3-To toilet/BSC: Mod A (lift or lower assist);4-From toilet/BSC: Min A (steadying Pt. > 75%)  FIM - Bed/Chair Transfer Bed/Chair Transfer Assistive Devices: Therapist, occupational: 0: Activity did not occur  FIM - Locomotion: Wheelchair Distance: 25 Locomotion: Wheelchair: 1: Total  Assistance/staff pushes wheelchair (Pt<25%) FIM - Locomotion: Ambulation Locomotion: Ambulation Assistive Devices: Designer, industrial/product Ambulation/Gait Assistance: 3: Mod assist;4: Min assist Locomotion: Ambulation:  (mod assist sit> stand; min assist amb)  Comprehension Comprehension Mode: Auditory Comprehension: 6-Follows complex conversation/direction: With extra time/assistive device  Expression Expression Mode: Verbal Expression: 5-Expresses basic needs/ideas: With extra time/assistive device  Social Interaction Social Interaction: 5-Interacts appropriately 90% of the time - Needs monitoring or encouragement for participation or interaction.  Problem Solving Problem Solving: 5-Solves complex 90% of the time/cues < 10% of the time  Memory Memory Assistive Devices: Memory book Memory: 4-Recognizes or recalls 75 - 89% of the time/requires cueing 10 - 24% of the time  Medical Problem List and Plan:  1. Deconditioning related to CHF weight reduce from 127kg- 124kg and multi-medical, toxic encephalopathy improving  2. DVT Prophylaxis/Anticoagulation: Subcutaneous heparin. Monitor platelet counts any signs of bleeding  3. Pain Management: Neurontin 300 mg daily, Percocet as needed. Monitor mental status  4. Mood/delirium/depression. Prozac 20 mg daily. Provide emotional support and positive reinforcement  5. Neuropsych: This patient is capable of making decisions on his/her own behalf.  6. Hypertension/atrial fibrillation. Coreg 6.25 mg twice a day, Aldactone 25 mg daily, Demadex 40 mg daily. Monitor with increased mobility. No plans for chronic anti-coagulation secondary to fall risk and history of alcohol use  7. Pseudomonas UTI. Completed Cipro 8. Subcutaneous masses  on lower back and lower tremor the felt to be chronic in nature. We'll continue to provide conservative care  9. Question cellulitis lower extremities. Vancomycin initiate 09/08/2012 x7 days. Monitor skin closely  10.  Diabetes mellitus. Hemoglobin A1c of 5.8. Lantus insulin 10 units daily. Check blood sugars a.c. and at bedtime  11. Incidental finding of L1 transverse process fracture acute versus subacute. Conservative care advised as per neurosurgery Dr. Phoebe Perch  12. Hypothyroidism. Synthroid  13. Hyperlipidemia. Zocor  14. Chronic renal insufficiency. Creatinine baseline 1.27-1.58. Followup chemistries  15. COPD. Check oxygen saturations every shift    LOS (Days) 6 A FACE TO FACE EVALUATION WAS PERFORMED  KIRSTEINS,ANDREW E 09/14/2012, 9:09 AM

## 2012-09-14 NOTE — Progress Notes (Signed)
Occupational Therapy Session Note  Patient Details  Name: [name] MRN: [id number] Date of Birth: 1946/03/30  Today's Date: 09/14/2012 Time: 0805-0900 Time Calculation (min): 55 min  Short Term Goals: Week 1:  OT Short Term Goal 1 (Week 1): Pt will be able to ambulate to the toilet with supervision. OT Short Term Goal 2 (Week 1): Pt will be able to sit to stand to pull up pants with supervision. OT Short Term Goal 3 (Week 1): Pt will be able to use a long sponge to bathe with min assist. OT Short Term Goal 4 (Week 1): Pt will be able to don pants with mod assist.  Skilled Therapeutic Interventions/Progress Updates:    Pt seen for ADL retraining of dressing LB.  Pt declined a bath and declined donning clothing today, but agreed to practicing putting on underwear and pants.  He used a Sports administrator with mod assist to don underwear over feet and then was able to pull pants up.  From EOB he could don regular dress pants over feet without AE, but needed assist to remove them from w/c level. Pt stood numerous times with supervision and ambulated from bed to sink with RW.  He became too fatigued to stand and brush teeth, so he opted to sit in wheelchair.  He has some difficulty with holding objects in left hand.  Therapy Documentation Precautions:  Precautions Precautions: Fall Precaution Comments: on nasal canula Restrictions Weight Bearing Restrictions: No    Pain: Pain Assessment Pain Assessment: 0-10 Pain Score:   5 Pain Type: Acute pain Pain Location: Back Pain Descriptors: Aching Pain Intervention(s): Rest;Repositioned  ADL: See FIM for current functional status  Therapy/Group: Individual Therapy  Reene Harlacher 09/14/2012, 10:17 AM

## 2012-09-14 NOTE — Progress Notes (Signed)
Physical Therapy Session Note  Patient Details  Name: [name] MRN: [id number] Date of Birth: [birth year]  Short Term Goals: Week 1:  PT Short Term Goal 1 (Week 1): Pt will transfer bed>< w/c or recliner with assistance of 1 person. PT Short Term Goal 1 - Progress (Week 1): Met PT Short Term Goal 2 (Week 1): Pt will propel w/c x 50' with supervision, for general activity tolerance. PT Short Term Goal 2 - Progress (Week 1): Partly met PT Short Term Goal 3 (Week 1): Pt will perform gait x 20' with LRAD and assistance of 1 person. PT Short Term Goal 3 - Progress (Week 1): Met PT Short Term Goal 4 (Week 1): Pt will stand x 5 minutes during functional activity. PT Short Term Goal 4 - Progress (Week 1): Not met  Today's Date: 09/14/2012   Session 1 Time: 1030-1100 Time Calculation (min): 30 min  Skilled Therapeutic Interventions/Progress Updates:   Sit/stand transfer from WC/RW x3 with min A and extended time and effort.  Patient ambulated in controlled environment with RW and contact assist 50' 3 with one standing rest break during first bout, no rest breaks during subsequent bouts.  Patient performed mini-squats at [facility] in front of WC x5 and sit/stand transfers x3 for LE strengthening.  Extended rest breaks required due to c/o fatigue.     Pain: Pain Assessment Pain Assessment: 0-10 Pain Score:   6  Pain Type: Acute pain Pain Location: Hip Pain Orientation: Left Pain Descriptors: Aching Pain Onset: On-going Pain Intervention(s): RN made aware Multiple Pain Sites: No    Session 2 Time: 1330-1416 Time Calculation (min): 46 min Amount of Missed PT Time (min): 10 Minutes Missed Time Reason: Other (comment) (patient trying to have BM)  Pain: Pain Assessment Pain Assessment: 0-10 Pain Score:   5 Pain Type: Acute pain Pain Location: Hip Pain Orientation: Left Pain Descriptors: Aching Pain Onset: On-going Pain Intervention(s): RN made aware Multiple Pain Sites:  No  Skilled Therapeutic Interventions/Progress Updates:   Patient performed sit/stand transfer from [facility] to commode using grab bar with min A.  Patient performed standing activity at sink with min A for steadying while washing hands for 1 minute.  Wheelchair propulsion in controlled environment with mod A 20' to increase endurance before patient reported fatigue.  Remainder of trip to gym patient transported in [facility] by staff.  Standing activity at activity table with one UE support for one bout of 5 minutes.  Standing activity at activity table without UE support for two bouts of 2 minutes each.  All standing activities separated by seated rest breaks.  Patient performed total of 5 sit/stand transfers from [facility] with mod A and extended time for increased bilateral LE strengthening.  See FIM for current functional status  Therapy/Group: Individual Therapy  Rexene Agent 09/14/2012, 2:41 PM

## 2012-09-14 NOTE — Progress Notes (Addendum)
Physical Therapy Weekly Progress Note  Patient Details  Name: [name] MRN: [id number] Date of Birth: [birth year]  Today's Date: 09/14/2012 Time: 1105-1200 Time Calculation (min): 55 min  Short Term Goals: Week 1:  PT Short Term Goal 1 (Week 1): Pt will transfer bed>< w/c or recliner with assistance of 1 person. PT Short Term Goal 1 - Progress (Week 1): Met PT Short Term Goal 2 (Week 1): Pt will propel w/c x 50' with supervision, for general activity tolerance. PT Short Term Goal 2 - Progress (Week 1): Partly met PT Short Term Goal 3 (Week 1): Pt will perform gait x 20' with LRAD and assistance of 1 person. PT Short Term Goal 3 - Progress (Week 1): Met PT Short Term Goal 4 (Week 1): Pt will stand x 5 minutes during functional activity. PT Short Term Goal 4 - Progress (Week 1): Not met     Pt met 2/4 goals; frequent B and B activity during therapies has limited ability to attempt standing during functional activity.  L hand weakness limits w/c mobility.  Skilled Therapeutic Interventions/Progress Updates:   W/c mobility using bil UEs, tactile cues for L hand, x 50'.  Pt appears to have intrinsic muscle weakness, and mild deformity of L hand, plus reduced sensation, making propulsion difficult.  Seated Therapeutic exercise performed with LE to increase strength for functional mobility: 10 x 1 each- bil hip adduction, alernating knee extension with ankle DF at end range, marching in place  Sit> stand with mod assist, to bariatric RW.  Gait training with min assist of 1x 87', with assistance of another person for w/c following (pt afraid he would be incontinent of bowel) and O2 tank.  VCs and tactile cues for upright trunk, forward gaze.  Pt reported he needed to use toilet.  W/c>< commode chair over toilet, stand pivot using wall bars, min assist.  Pt urinated, but did not have BM.  Standing therapeutic exercise performed with LE to increase strength for functional mobility:  using bil hands on hall railing: Mini squats with R and L modified tandem position Calf raises- pt with little active PF to lift heels bil shoulder adduction, without UE support Sidestepping L and R, aligned to promote hip abduction rather than ER.  Pt's doorways at home = 29.5" wide.  Bariatric RW = 28" wide.  Pt is more functional with sit>< stand using bariatric RW, but clearance may be a problem at home if there are tight turns at doorways.      Therapy Documentation Precautions:  Precautions Precautions: Fall Precaution Comments: 2L O2 Restrictions Weight Bearing Restrictions: No   Pain: Pain Assessment Pain Assessment: 0-10 Pain Score:   6 Pain Type: Acute pain Pain Location: Hip Pain Orientation: Left Pain Descriptors: Aching;Discomfort Pain Onset: On-going Patients Stated Pain Goal: 3 Pain Intervention(s): Medication (See eMAR) Multiple Pain Sites: No   Locomotion : Ambulation Ambulation/Gait Assistance: 1: +2 Total assist      See FIM for current functional status  Therapy/Group: Individual Therapy  [name] 09/14/2012, 12:31 PM

## 2012-09-15 ENCOUNTER — Inpatient Hospital Stay (HOSPITAL_COMMUNITY): Payer: Medicare Other

## 2012-09-15 ENCOUNTER — Inpatient Hospital Stay (HOSPITAL_COMMUNITY): Payer: Medicare Other | Admitting: Physical Therapy

## 2012-09-15 ENCOUNTER — Inpatient Hospital Stay (HOSPITAL_COMMUNITY): Payer: Medicare Other | Admitting: Occupational Therapy

## 2012-09-15 LAB — GLUCOSE, CAPILLARY
Glucose-Capillary: 100 mg/dL — ABNORMAL HIGH (ref 70–99)
Glucose-Capillary: 121 mg/dL — ABNORMAL HIGH (ref 70–99)
Glucose-Capillary: 175 mg/dL — ABNORMAL HIGH (ref 70–99)

## 2012-09-15 LAB — BASIC METABOLIC PANEL
BUN: 65 mg/dL — ABNORMAL HIGH (ref 6–23)
Calcium: 9.7 mg/dL (ref 8.4–10.5)
GFR calc Af Amer: 56 mL/min — ABNORMAL LOW (ref >90)
GFR calc non Af Amer: 48 mL/min — ABNORMAL LOW (ref >90)
Glucose, Bld: 116 mg/dL — ABNORMAL HIGH (ref 70–99)
Potassium: 4.2 mEq/L (ref 3.5–5.1)
Sodium: 136 mEq/L (ref 135–145)

## 2012-09-15 MED ORDER — WHITE PETROLATUM GEL
Status: AC
  Administered 2012-09-15: 0.2
  Filled 2012-09-15: qty 5

## 2012-09-15 NOTE — Progress Notes (Signed)
Physical Therapy Session Note  Patient Details  Name: [name] MRN: [id number] Date of Birth: 1946/01/26  Today's Date: 09/15/2012 Time: 4098-1191 Time Calculation (min): 45 min  Short Term Goals: Week 1:  PT Short Term Goal 1 (Week 1): Pt will transfer bed>< w/c or recliner with assistance of 1 person. PT Short Term Goal 1 - Progress (Week 1): Met PT Short Term Goal 2 (Week 1): Pt will propel w/c x 50' with supervision, for general activity tolerance. PT Short Term Goal 2 - Progress (Week 1): Partly met PT Short Term Goal 3 (Week 1): Pt will perform gait x 20' with LRAD and assistance of 1 person. PT Short Term Goal 3 - Progress (Week 1): Met PT Short Term Goal 4 (Week 1): Pt will stand x 5 minutes during functional activity. PT Short Term Goal 4 - Progress (Week 1): Not met  Skilled Therapeutic Interventions/Progress Updates:    This session focused on WC mobility 60' for upper extremity strength and endurance training (supervision and lots of encouragement to keep going).  Pt with difficulty gripping with left hand on smooth WC rim. Gait with bari RW, 2L O2 C and WC to follow: 150' x 1, 175' x 1 min guard assist as pt fatigued for safety due to self reports of left leg feeling weak.  O2 sats dropped down to 85% on 2L O2 [name].  Practiced pursed lip breathing during seated rest breaks (x2).  Pt's ultimate goal is to walk without the RW, but is aware that this may not happen before he goes home.      Therapy Documentation Precautions:  Precautions Precautions: Fall Precaution Comments: 2L O2 Restrictions Weight Bearing Restrictions: No General:   Vital Signs: Therapy Vitals Temp: 97.6 F (36.4 C) Temp src: Oral Pulse Rate: 72  Resp: 18  BP: 130/73 mmHg Patient Position, if appropriate: Sitting Oxygen Therapy SpO2: 100 % O2 Device: None (Room air) O2 Flow Rate (L/min): 2 L/min Pain: Pain Assessment Pain Assessment: No/denies pain Pain Score:   5 Pain Type: Acute  pain Pain Location: Leg Pain Orientation: Left Pain Descriptors: Aching Pain Intervention(s): Medication (See eMAR) Mobility:   Locomotion : Ambulation Ambulation/Gait Assistance: 4: Min guard Wheelchair Mobility Distance: 60   See FIM for current functional status  Therapy/Group: Individual Therapy  Lurena Joiner B. Davionte Lusby, PT, DPT [phone]   09/15/2012, 4:18 PM

## 2012-09-15 NOTE — Progress Notes (Signed)
Patient ID: [name], male   DOB: January 14, 1946, 66 y.o.   MRN: [id number]  Subjective/Complaints: male with history of COPD, atrial fibrillation but not a candidate for Coumadin secondary to history alcohol use, CAD with coronary artery bypass grafting 2001, left lower extremity chronic hematoma and chronic diastolic CHF. Admitted 08/30/2012 with altered mental status and oxygen saturations 85% on room air. Findings of elevated creatinine 1.93. Cardiac enzymes were unremarkable. Chest x-ray completed showing moderate pulmonary vascular congestion suspect CHF. Echocardiogram with ejection fraction of 60% and moderate RV dilatation, moderate TR. Patient placed on intravenous Lasix with followup cardiology services monitored closely for volume overload. His volume status greatly improved and Lasix was changed to Demadex. Subcutaneous heparin added for DVT prophylaxis. Hospital course Pseudomonas UTI maintained on Cipro. Patient with multiple soft tissue masses on his back bilaterally primarily along the left flank hematoma and left leg that was chronic in nature. Noncontrast CT of abdomen and pelvis showed interval decrease in size of previously described right sided presumed subcutaneous hematomas. Noted increasing cellulitic changes lower tremor to his questionable cellulitis intravenous vancomycin was initiated 09/08/2012 x7 days. There was incidental finding of new acute/subacute L1 transverse process fracture which was discussed with neurosurgery Dr. Phoebe Perch and advise conservative care and did not need outpatient followup with neurosurgery.    Showering No c/o today  Objective: Vital Signs: Blood pressure 118/70, pulse 70, temperature 97.2 F (36.2 C), temperature source Oral, resp. rate 22, weight 125.788 kg (277 lb 5 oz), SpO2 99.00%. No results found. Results for orders placed during the hospital encounter of 09/08/12 (from the past 72 hour(s))  GLUCOSE, CAPILLARY     Status: Abnormal   Collection Time   09/12/12 11:29 AM      Component Value Range Comment   Glucose-Capillary 131 (*) 70 - 99 mg/dL    Comment 1 Notify RN     GLUCOSE, CAPILLARY     Status: Abnormal   Collection Time   09/12/12  4:40 PM      Component Value Range Comment   Glucose-Capillary 115 (*) 70 - 99 mg/dL    Comment 1 Notify RN     GLUCOSE, CAPILLARY     Status: Abnormal   Collection Time   09/12/12  9:07 PM      Component Value Range Comment   Glucose-Capillary 183 (*) 70 - 99 mg/dL   BASIC METABOLIC PANEL     Status: Abnormal   Collection Time   09/13/12  6:05 AM      Component Value Range Comment   Sodium 132 (*) 135 - 145 mEq/L    Potassium 4.3  3.5 - 5.1 mEq/L    Chloride 93 (*) 96 - 112 mEq/L    CO2 31  19 - 32 mEq/L    Glucose, Bld 119 (*) 70 - 99 mg/dL    BUN 66 (*) 6 - 23 mg/dL    Creatinine, Ser 1.19 (*) 0.50 - 1.35 mg/dL    Calcium 9.4  8.4 - 14.7 mg/dL    GFR calc non Af Amer 42 (*) >90 mL/min    GFR calc Af Amer 48 (*) >90 mL/min   GLUCOSE, CAPILLARY     Status: Abnormal   Collection Time   09/13/12  7:07 AM      Component Value Range Comment   Glucose-Capillary 119 (*) 70 - 99 mg/dL    Comment 1 Notify RN     GLUCOSE, CAPILLARY     Status: Abnormal  Collection Time   09/13/12 11:35 AM      Component Value Range Comment   Glucose-Capillary 130 (*) 70 - 99 mg/dL    Comment 1 Notify RN     GLUCOSE, CAPILLARY     Status: Abnormal   Collection Time   09/13/12  4:31 PM      Component Value Range Comment   Glucose-Capillary 124 (*) 70 - 99 mg/dL    Comment 1 Notify RN     GLUCOSE, CAPILLARY     Status: Abnormal   Collection Time   09/13/12  8:40 PM      Component Value Range Comment   Glucose-Capillary 191 (*) 70 - 99 mg/dL    Comment 1 Notify RN     BASIC METABOLIC PANEL     Status: Abnormal   Collection Time   09/14/12  5:17 AM      Component Value Range Comment   Sodium 136  135 - 145 mEq/L    Potassium 4.3  3.5 - 5.1 mEq/L    Chloride 95 (*) 96 - 112 mEq/L    CO2  31  19 - 32 mEq/L    Glucose, Bld 117 (*) 70 - 99 mg/dL    BUN 66 (*) 6 - 23 mg/dL    Creatinine, Ser 1.61 (*) 0.50 - 1.35 mg/dL    Calcium 9.5  8.4 - 09.6 mg/dL    GFR calc non Af Amer 48 (*) >90 mL/min    GFR calc Af Amer 56 (*) >90 mL/min   GLUCOSE, CAPILLARY     Status: Abnormal   Collection Time   09/14/12  7:13 AM      Component Value Range Comment   Glucose-Capillary 114 (*) 70 - 99 mg/dL    Comment 1 Notify RN     BASIC METABOLIC PANEL     Status: Abnormal   Collection Time   09/14/12  9:30 AM      Component Value Range Comment   Sodium 137  135 - 145 mEq/L    Potassium 4.1  3.5 - 5.1 mEq/L    Chloride 95 (*) 96 - 112 mEq/L    CO2 32  19 - 32 mEq/L    Glucose, Bld 119 (*) 70 - 99 mg/dL    BUN 63 (*) 6 - 23 mg/dL    Creatinine, Ser 0.45 (*) 0.50 - 1.35 mg/dL    Calcium 9.6  8.4 - 40.9 mg/dL    GFR calc non Af Amer 50 (*) >90 mL/min    GFR calc Af Amer 58 (*) >90 mL/min   GLUCOSE, CAPILLARY     Status: Abnormal   Collection Time   09/14/12 11:49 AM      Component Value Range Comment   Glucose-Capillary 130 (*) 70 - 99 mg/dL    Comment 1 Notify RN     GLUCOSE, CAPILLARY     Status: Abnormal   Collection Time   09/14/12  4:34 PM      Component Value Range Comment   Glucose-Capillary 132 (*) 70 - 99 mg/dL    Comment 1 Notify RN     GLUCOSE, CAPILLARY     Status: Abnormal   Collection Time   09/14/12  9:07 PM      Component Value Range Comment   Glucose-Capillary 140 (*) 70 - 99 mg/dL   BASIC METABOLIC PANEL     Status: Abnormal   Collection Time   09/15/12  5:37 AM  Component Value Range Comment   Sodium 136  135 - 145 mEq/L    Potassium 4.2  3.5 - 5.1 mEq/L    Chloride 96  96 - 112 mEq/L    CO2 32  19 - 32 mEq/L    Glucose, Bld 116 (*) 70 - 99 mg/dL    BUN 65 (*) 6 - 23 mg/dL    Creatinine, Ser 1.61 (*) 0.50 - 1.35 mg/dL    Calcium 9.7  8.4 - 09.6 mg/dL    GFR calc non Af Amer 48 (*) >90 mL/min    GFR calc Af Amer 56 (*) >90 mL/min   GLUCOSE, CAPILLARY      Status: Abnormal   Collection Time   09/15/12  7:13 AM      Component Value Range Comment   Glucose-Capillary 100 (*) 70 - 99 mg/dL    Comment 1 Notify RN        HEENT: normal Cardio: irregular Resp: CTA B/L GI: BS positive Extremity:  Edema bilateral pretibial 2+ Skin:   Other stasis dermatitis bilateral pretib, no erythema Neuro: Alert/Oriented, Abnormal Sensory diminished LT in feet, Abnormal Motor 4/5 in BUEs and RLE, 3-/5 in LLE and Other Hearing impaired Musc/Skel:  No pain with AROM of all 4 ext Gen NAD   Assessment/Plan: 1. Functional deficits secondary to Decondtioning acute on chronic diastolic CHF which require 3+ hours per day of interdisciplinary therapy in a comprehensive inpatient rehab setting. Physiatrist is providing close team supervision and 24 hour management of active medical problems listed below. Physiatrist and rehab team continue to assess barriers to discharge/monitor patient progress toward functional and medical goals. FIM: FIM - Bathing Bathing Steps Patient Completed: Chest;Right Arm;Left Arm;Abdomen;Right upper leg;Left upper leg;Right lower leg (including foot);Left lower leg (including foot) (with long sponge) Bathing: 4: Min-Patient completes 8-9 47f 10 parts or 75+ percent  FIM - Upper Body Dressing/Undressing Upper body dressing/undressing steps patient completed: Thread/unthread right sleeve of pullover shirt/dresss;Thread/unthread left sleeve of pullover shirt/dress;Put head through opening of pull over shirt/dress Upper body dressing/undressing: 0: Wears gown/pajamas-no public clothing FIM - Lower Body Dressing/Undressing Lower body dressing/undressing steps patient completed: Pull underwear up/down;Thread/unthread right pants leg;Thread/unthread left pants leg;Pull pants up/down Lower body dressing/undressing: 2: Max-Patient completed 25-49% of tasks  FIM - Hotel manager Devices: Grab bar or rail for support Toileting: 1:  Total-Patient completed zero steps, helper did all 3  FIM - Diplomatic Services operational officer Devices: Grab bars Toilet Transfers: 4-To toilet/BSC: Min A (steadying Pt. > 75%);4-From toilet/BSC: Min A (steadying Pt. > 75%)  FIM - Bed/Chair Transfer Bed/Chair Transfer Assistive Devices: Therapist, occupational: 0: Activity did not occur  FIM - Locomotion: Wheelchair Distance: 25 Locomotion: Wheelchair: 0: Activity did not occur FIM - Locomotion: Ambulation Locomotion: Ambulation Assistive Devices: Designer, industrial/product Ambulation/Gait Assistance: 3: Mod assist Locomotion: Ambulation: 1: Travels less than 50 ft with moderate assistance (Pt: 50 - 74%)  Comprehension Comprehension Mode: Auditory Comprehension: 5-Understands complex 90% of the time/Cues < 10% of the time  Expression Expression Mode: Verbal Expression: 5-Expresses complex 90% of the time/cues < 10% of the time  Social Interaction Social Interaction: 5-Interacts appropriately 90% of the time - Needs monitoring or encouragement for participation or interaction.  Problem Solving Problem Solving: 5-Solves complex 90% of the time/cues < 10% of the time  Memory Memory Assistive Devices: Memory book Memory: 4-Recognizes or recalls 75 - 89% of the time/requires cueing 10 - 24% of the time  Medical Problem  List and Plan:  1. Deconditioning related to CHF weight reduce from 127kg- 125.8kg and multi-medical, toxic encephalopathy improving  2. DVT Prophylaxis/Anticoagulation: Subcutaneous heparin. Monitor platelet counts any signs of bleeding  3. Pain Management: Neurontin 300 mg daily, Percocet as needed. Monitor mental status  4. Mood/delirium/depression. Prozac 20 mg daily. Provide emotional support and positive reinforcement  5. Neuropsych: This patient is capable of making decisions on his/her own behalf.  6. Hypertension/atrial fibrillation. Coreg 6.25 mg twice a day, Aldactone 25 mg daily, Demadex 40 mg daily.  Monitor with increased mobility. No plans for chronic anti-coagulation secondary to fall risk and history of alcohol use  7. Pseudomonas UTI. Completed Cipro 8. Subcutaneous masses on lower back and lower tremor the felt to be chronic in nature. We'll continue to provide conservative care  9. Question cellulitis lower extremities.Off  Vancomycin 12/6 Monitor skin closely  10. Diabetes mellitus. Hemoglobin A1c of 5.8. Lantus insulin 10 units daily. Check blood sugars a.c. and at bedtime  11. Incidental finding of L1 transverse process fracture acute versus subacute. Conservative care advised as per neurosurgery Dr. Phoebe Perch  12. Hypothyroidism. Synthroid  13. Hyperlipidemia. Zocor  14. Chronic renal insufficiency. Creatinine baseline 1.27-1.58. Followup chemistries  15. COPD. Check oxygen saturations every shift    LOS (Days) 7 A FACE TO FACE EVALUATION WAS PERFORMED  Yordin Rhoda E 09/15/2012, 9:59 AM

## 2012-09-15 NOTE — Progress Notes (Signed)
Occupational Therapy Weekly Progress Note  Patient Details  Name: RYLEN SWINDLER MRN: [id number] Date of Birth: 1946/06/29  Today's Date: 09/15/2012 Time: 0805-0900 and 1110-1140 Time Calculation (min): 55 min and 30 min  Visit 1:  No complaints of pain. Pt seen for BADL retraining of toileting, bathing, and dressing with a focus on sit to stand and standing balance. Pt continues to decline to wear clothing, but was eager to work on bathing. He is able to use long handled sponge well, but needs assist for perineal area and buttocks.  Visit 2: No complaints of pain.  Pt seen for toilet transfer training and toileting with RW.  Pt continues to be supervision with sit to stand and ambulation to bathroom.  He needs a great deal of encouragement at times as he often feels that he will not be able to do the tasks. Pt worked on [employer] of UE arom exercises. Provided with a handout.  Patient has met 4 of 4 short term goals.  Pt is progressing well and is improving well with his endurance and functional mobility skills.  Patient continues to demonstrate the following deficits: min assist with standing balance and need to learn how to use AE in self care and therefore will continue to benefit from skilled OT intervention to enhance overall performance with BADL.  Patient progressing toward long term goals.  Continue plan of care.  OT Short Term Goals Week 1:  OT Short Term Goal 1 (Week 1): Pt will be able to ambulate to the toilet with supervision. OT Short Term Goal 1 - Progress (Week 1): Met OT Short Term Goal 2 (Week 1): Pt will be able to sit to stand to pull up pants with supervision. OT Short Term Goal 2 - Progress (Week 1): Met OT Short Term Goal 3 (Week 1): Pt will be able to use a long sponge to bathe with min assist. OT Short Term Goal 3 - Progress (Week 1): Met OT Short Term Goal 4 (Week 1): Pt will be able to don pants with mod assist. OT Short Term Goal 4 - Progress (Week 1): Met Week 2:   OT Short Term Goal 1 (Week 2): Pt will complete a tub bench or a shower stall transfer with min assist. OT Short Term Goal 1 - Progress (Week 2): Progressing toward goal OT Short Term Goal 2 (Week 2): Pt will don pants with min assist. OT Short Term Goal 2 - Progress (Week 2): Progressing toward goal OT Short Term Goal 3 (Week 2): Pt will toilet with min assist (needing assist for cleanup only).  Skilled Therapeutic Interventions/Progress Updates: Continue OT plan of care 5-7 days a week, 60 -90 min with Balance/vestibular training;Discharge planning;DME/adaptive equipment instruction;Patient/family education;Functional mobility training;Self Care/advanced ADL retraining;UE/LE Strength taining/ROM;Therapeutic Exercise;Therapeutic Activities to maximize his level of independence with self care.  Therapy Documentation Precautions:  Precautions Precautions: Fall Precaution Comments: 2L O2 Restrictions Weight Bearing Restrictions: No  ADL:  See FIM for current functional status  Therapy/Group: Individual Therapy  SAGUIER,JULIA 09/15/2012, 10:41 AM

## 2012-09-15 NOTE — Progress Notes (Signed)
Physical Therapy Session Note  Patient Details  Name: [name] MRN: [id number] Date of Birth: December 28, 1945  Today's Date: 09/15/2012 Time: 1335-1430 Time Calculation (min): 55 min  Short Term Goals: Week 1:  PT Short Term Goal 1 (Week 1): Pt will transfer bed>< w/c or recliner with assistance of 1 person. PT Short Term Goal 1 - Progress (Week 1): Met PT Short Term Goal 2 (Week 1): Pt will propel w/c x 50' with supervision, for general activity tolerance. PT Short Term Goal 2 - Progress (Week 1): Partly met PT Short Term Goal 3 (Week 1): Pt will perform gait x 20' with LRAD and assistance of 1 person. PT Short Term Goal 3 - Progress (Week 1): Met PT Short Term Goal 4 (Week 1): Pt will stand x 5 minutes during functional activity. PT Short Term Goal 4 - Progress (Week 1): Not met  Skilled Therapeutic Interventions/Progress Updates:  Treatment focused on gait and LE strengthening exercises. Patient sit to stand with min assist and ambulated with rolling walker 110 feet x 1 and 140 feet x 1. Patient supine to sit on mat with minimal assist with left LE. Patient performed LE strengthening exercises x 10 reps including heel slides, hip adduction, hip abduction pillow squeezes, glut sets, and ankle dorsiflexion. Patient transferred to toilet with min assist. Nursing notified that patient was left on toilet.  Therapy Documentation Precautions:  Precautions Precautions: Fall Precaution Comments: 2L O2 Restrictions Weight Bearing Restrictions: No  Pain: Patient denies pain  Locomotion : Ambulation Ambulation/Gait Assistance: 4: Min assist   See FIM for current functional status  Therapy/Group: Individual Therapy  Arelia Longest M 09/15/2012, 3:28 PM

## 2012-09-15 NOTE — Progress Notes (Signed)
Advanced Heart Failure Rounding Note  Subjective:  [name] is a 66 yo male with PMX s/f CAD (s/p CABG x 4 in 2001, normal Myoview 2010), chronic diastolic CHF, persistent a-fib, DM, HTN, HL, carotid artery disease, COPD, hypothyroidism, EtOH abuse, chronic venous insufficiency and morbid obesity.  He is not anticoagulants due to ETOH abuse and fall risk.   Echo 09/2011: LVEF 45-50%, mod biatral enlargement, moderate RV dilatation, moderate TR, PASP 66 mmHg.  ECHO 08/2012 EF 55-60% moderate bilateral RA/LA enlargement  Admitted 08/30/12 for acute on chronic diastolic CHF. He has been diuresed with IV lasix 40 mg bid.   Admit weight 278 but trending up to 282 pounds. After reviewing office note weights from the last 6 months his weight was 240-250 pounds.   Lasix drip stopped 08/2812. 09/07/12 CT showed multiple resolving SQ hematomas. D/C from acute care 09/08/12 to acute rehab weight at that time was 264 pounds. Switched to Toresemide 40 mg daily then titrated to 40 bid.  Diuresing well. Weight inaccurate.  Cr stable at 1.4  Denies SOB/PND/Orthopnea. Getting bathed   Intake/Output Summary (Last 24 hours) at 09/15/12 0841 Last data filed at 09/15/12 0401  Gross per 24 hour  Intake   1350 ml  Output   1900 ml  Net   -550 ml    Current meds:    . aspirin  325 mg Oral Daily  . carvedilol  6.25 mg Oral BID WC  . FLUoxetine  20 mg Oral Daily  . folic acid  1 mg Oral Daily  . gabapentin  300 mg Oral Daily  . heparin subcutaneous  5,000 Units Subcutaneous Q8H  . insulin aspart  0-9 Units Subcutaneous TID WC  . insulin glargine  10 Units Subcutaneous QHS  . lactulose  30 g Oral Daily  . levothyroxine  150 mcg Oral QAC breakfast  . multivitamin with minerals  1 tablet Oral Daily  . potassium chloride  40 mEq Oral BID  . simvastatin  40 mg Oral QHS  . spironolactone  25 mg Oral Daily  . torsemide  40 mg Oral BID  . [COMPLETED] vancomycin  750 mg Intravenous Q24H   Infusions:      Objective:  Blood pressure 118/70, pulse 70, temperature 97.2 F (36.2 C), temperature source Oral, resp. rate 22, weight 125.788 kg (277 lb 5 oz), SpO2 99.00%. Weight change: 1.088 kg (2 lb 6.4 oz)  Physical Exam: General:  Chronically ill. No resp difficulty HEENT: normal Neck: supple. JVP  Hard to see Carotids 2+ bilat; no bruits. No lymphadenopathy or thryomegaly appreciated. Cor: PMI nondisplaced. Regular rate & rhythm. No rubs, gallops. 1/6 SEM RUSB. Lungs: Crackles at bases bilaterally on 2 liters [place] Abdomen: obese soft, nontender, nondistended. No hepatosplenomegaly. No bruits or masses. Good bowel sounds. Large irregular subQ masses on back Extremities: no cyanosis, clubbing, rash, R and LLE  pale pink trace edema . Neuro: alert & orientedx3, cranial nerves grossly intact. moves all 4 extremities w/o difficulty. Affect pleasant   Basic Metabolic Panel:  Lab 09/15/12 4098 09/14/12 0930 09/14/12 0517 09/13/12 0605 09/12/12 0500  NA 136 137 136 132* 134*  K 4.2 4.1 -- -- --  CL 96 95* 95* 93* 95*  CO2 32 32 31 31 32  GLUCOSE 116* 119* 117* 119* 119*  BUN 65* 63* 66* 66* 63*  CREATININE 1.44* 1.40* 1.44* 1.63* 1.62*  CALCIUM 9.7 9.6 9.5 9.4 9.3  MG -- -- -- -- --  PHOS -- -- -- -- --  Liver Function Tests:  Lab 09/09/12 0620  AST 16  ALT 13  ALKPHOS 64  BILITOT 0.7  PROT 6.8  ALBUMIN 2.9*   No results found for this basename: LIPASE:5,AMYLASE:5 in the last 168 hours No results found for this basename: AMMONIA:5 in the last 168 hours CBC:  Lab 09/09/12 0620  WBC 6.1  NEUTROABS 3.6  HGB 8.0*  HCT 27.7*  MCV 88.5  PLT 132*   Cardiac Enzymes: No results found for this basename: CKTOTAL:5,CKMB:5,CKMBINDEX:5,TROPONINI:5 in the last 168 hours BNP: No components found with this basename: POCBNP:5 CBG:  Lab 09/15/12 0713 09/14/12 2107 09/14/12 1634 09/14/12 1149 09/14/12 0713  GLUCAP 100* 140* 132* 130* 114*   Microbiology: Lab Results  Component  Value Date   CULT PSEUDOMONAS AERUGINOSA 09/04/2012   CULT NO GROWTH 10/05/2011   CULT NO GROWTH 5 DAYS 10/05/2011   CULT NO GROWTH 5 DAYS 10/05/2011   CULT NO GROWTH 5 DAYS 06/25/2010   No results found for this basename: CULT:2,SDES:2 in the last 168 hours  Imaging: No results found.   ASSESSMENT:   1. A/C diastolic heart failure EF 55-60% RV moderately dilated/HK with RV-RA gradient 56 mmHg.  2. Right heart failure 3. A Fib 4. Hypothyroidism 5. Obesity 6. CAD 7. Hematoma 8. Acute renal failure (baseline creatinine 0.8-1.1): suspect cardiorenal  9. Hypokalemia 10. SubQ masses on lower back = hematomas (resolving) 11. LE cellulitis  PLAN/DISCUSSION  Volume status continues to improve. Renal function stable would continue current regimen.  Cellulitis improving. Off abx now.  Discharge from rehab planned for 09/22/12 Once discharged he will be followed in HF clinic.  Appreciate rehab services.   Truman Hayward

## 2012-09-15 NOTE — Progress Notes (Signed)
Advanced Heart Failure Rounding Note  Subjective:  [name] is a 66 yo male with PMX s/f CAD (s/p CABG x 4 in 2001, normal Myoview 2010), chronic diastolic CHF, persistent a-fib, DM, HTN, HL, carotid artery disease, COPD, hypothyroidism, EtOH abuse, chronic venous insufficiency and morbid obesity.  He is not anticoagulants due to ETOH abuse and fall risk.   Echo 09/2011: LVEF 45-50%, mod biatral enlargement, moderate RV dilatation, moderate TR, PASP 66 mmHg.  ECHO 08/2012 EF 55-60% moderate bilateral RA/LA enlargement  Admitted 08/30/12 for acute on chronic diastolic CHF. He has been diuresed with IV lasix 40 mg bid.   Admit weight 278 but trending up to 282 pounds. After reviewing office note weights from the last 6 months his weight was 240-250 pounds.   Lasix drip stopped 08/2812. 09/07/12 CT showed multiple resolving SQ hematomas. D/C from acute care 09/08/12 to acute rehab weight at that time was 264 pounds. Switched to Toresemide 40 mg daily. Yesterday Torsemide increased to 40 mg bid. I/O -2.3 liters.  Weight down 3 pounds over night.    Cr 1.17->1.4->1.5> 1.57>1.44>1.62 >1.63>1.44  Denies SOB/PND/Orthopnea   Intake/Output Summary (Last 24 hours) at 09/15/12 0832 Last data filed at 09/15/12 0401  Gross per 24 hour  Intake   1350 ml  Output   1900 ml  Net   -550 ml    Current meds:    . aspirin  325 mg Oral Daily  . carvedilol  6.25 mg Oral BID WC  . FLUoxetine  20 mg Oral Daily  . folic acid  1 mg Oral Daily  . gabapentin  300 mg Oral Daily  . heparin subcutaneous  5,000 Units Subcutaneous Q8H  . insulin aspart  0-9 Units Subcutaneous TID WC  . insulin glargine  10 Units Subcutaneous QHS  . lactulose  30 g Oral Daily  . levothyroxine  150 mcg Oral QAC breakfast  . multivitamin with minerals  1 tablet Oral Daily  . potassium chloride  40 mEq Oral BID  . simvastatin  40 mg Oral QHS  . spironolactone  25 mg Oral Daily  . torsemide  40 mg Oral BID  . [COMPLETED]  vancomycin  750 mg Intravenous Q24H   Infusions:     Objective:  Blood pressure 118/70, pulse 70, temperature 97.2 F (36.2 C), temperature source Oral, resp. rate 22, weight 125.788 kg (277 lb 5 oz), SpO2 99.00%. Weight change: 1.088 kg (2 lb 6.4 oz)  Physical Exam: General:  Chronically ill. No resp difficulty HEENT: normal Neck: supple. JVP 10-11 Carotids 2+ bilat; no bruits. No lymphadenopathy or thryomegaly appreciated. Cor: PMI nondisplaced. Regular rate & rhythm. No rubs, gallops. 1/6 SEM RUSB. Lungs: Crackles at bases bilaterally on 2 liters [place] Abdomen: obese soft, nontender, nondistended. No hepatosplenomegaly. No bruits or masses. Good bowel sounds. Large irregular subQ masses on back Extremities: no cyanosis, clubbing, rash, R and LLE  pale pink trace edema . Bilateral ted hose in place.  Neuro: alert & orientedx3, cranial nerves grossly intact. moves all 4 extremities w/o difficulty. Affect pleasant   Basic Metabolic Panel:  Lab 09/15/12 1610 09/14/12 0930 09/14/12 0517 09/13/12 0605 09/12/12 0500  NA 136 137 136 132* 134*  K 4.2 4.1 -- -- --  CL 96 95* 95* 93* 95*  CO2 32 32 31 31 32  GLUCOSE 116* 119* 117* 119* 119*  BUN 65* 63* 66* 66* 63*  CREATININE 1.44* 1.40* 1.44* 1.63* 1.62*  CALCIUM 9.7 9.6 9.5 9.4 9.3  MG -- -- -- -- --  PHOS -- -- -- -- --   Liver Function Tests:  Lab 09/09/12 0620  AST 16  ALT 13  ALKPHOS 64  BILITOT 0.7  PROT 6.8  ALBUMIN 2.9*   No results found for this basename: LIPASE:5,AMYLASE:5 in the last 168 hours No results found for this basename: AMMONIA:5 in the last 168 hours CBC:  Lab 09/09/12 0620  WBC 6.1  NEUTROABS 3.6  HGB 8.0*  HCT 27.7*  MCV 88.5  PLT 132*   Cardiac Enzymes: No results found for this basename: CKTOTAL:5,CKMB:5,CKMBINDEX:5,TROPONINI:5 in the last 168 hours BNP: No components found with this basename: POCBNP:5 CBG:  Lab 09/15/12 0713 09/14/12 2107 09/14/12 1634 09/14/12 1149 09/14/12 0713   GLUCAP 100* 140* 132* 130* 114*   Microbiology: Lab Results  Component Value Date   CULT PSEUDOMONAS AERUGINOSA 09/04/2012   CULT NO GROWTH 10/05/2011   CULT NO GROWTH 5 DAYS 10/05/2011   CULT NO GROWTH 5 DAYS 10/05/2011   CULT NO GROWTH 5 DAYS 06/25/2010   No results found for this basename: CULT:2,SDES:2 in the last 168 hours  Imaging: No results found.   ASSESSMENT:   1. A/C diastolic heart failure EF 55-60% RV moderately dilated/HK with RV-RA gradient 56 mmHg.  2. Right heart failure 3. A Fib 4. Hypothyroidism 5. Obesity 6. CAD 7. Hematoma 8. Acute renal failure (baseline creatinine 0.8-1.1): suspect cardiorenal  9. Hypokalemia 10. SubQ masses on lower back = hematomas (resolving) 11. LE cellulitis  PLAN/DISCUSSION  Volume status improving but still elevated. Weight down 3 pounds over night. Continue to diurese with torsemide 40 mg bid. Renal function stable.     Cellulitis improving. Off abx now.  Discharge from rehab planned for 09/22/12 Once discharged he will be followed in HF clinic.   Appreciate rehab services.   Truman Hayward

## 2012-09-15 NOTE — Progress Notes (Signed)
Social Work Patient ID: [name], male   DOB: [birth year], 66 y.o.   MRN: [id number] Re-scheduled family education for Monday at 10:30-1:45.  Daughter reports this will work better for her. Pt making good progress in therapies.

## 2012-09-15 NOTE — Progress Notes (Signed)
Brief Nutrition Note  RD reviewed chart.  BMI=41.4.   Pt meets criteria for extreme obesity based on current BMI.  Current diet order is hearth healthy, patient is consuming approximately 80-100% of meals at this time. Pt denies wt loss or concerns with appetite at this time. Labs and medications reviewed. No skin breakdown noted.   (Renal labs stable but elevated.)  No nutrition interventions warranted at this time.  If additional nutrition issues arise, please consult RD.   Oran Rein, RD, LDN Clinical Inpatient Dietitian Pager:  [phone] Weekend and after hours pager:  [phone]

## 2012-09-16 ENCOUNTER — Inpatient Hospital Stay (HOSPITAL_COMMUNITY): Payer: Medicare Other | Admitting: Physical Therapy

## 2012-09-16 LAB — BASIC METABOLIC PANEL
CO2: 29 mEq/L (ref 19–32)
Chloride: 95 mEq/L — ABNORMAL LOW (ref 96–112)
Glucose, Bld: 128 mg/dL — ABNORMAL HIGH (ref 70–99)
Sodium: 135 mEq/L (ref 135–145)

## 2012-09-16 LAB — GLUCOSE, CAPILLARY: Glucose-Capillary: 135 mg/dL — ABNORMAL HIGH (ref 70–99)

## 2012-09-16 MED ORDER — ALTEPLASE 2 MG IJ SOLR
2.0000 mg | Freq: Once | INTRAMUSCULAR | Status: AC
  Administered 2012-09-16: 2 mg
  Filled 2012-09-16: qty 2

## 2012-09-16 MED ORDER — ALTEPLASE 2 MG IJ SOLR
2.0000 mg | Freq: Once | INTRAMUSCULAR | Status: DC
  Administered 2012-09-16: 2 mg

## 2012-09-16 MED ORDER — ALTEPLASE 2 MG IJ SOLR
4.0000 mg | Freq: Once | INTRAMUSCULAR | Status: AC
  Administered 2012-09-16: 4 mg
  Filled 2012-09-16: qty 4

## 2012-09-16 NOTE — Progress Notes (Signed)
Patient ID: [name], male   DOB: 06-Jul-1946, 66 y.o.   MRN: [id number]  Subjective/Complaints: male with history of COPD, atrial fibrillation but not a candidate for Coumadin secondary to history alcohol use, CAD with coronary artery bypass grafting 2001, left lower extremity chronic hematoma and chronic diastolic CHF. Admitted 08/30/2012 with altered mental status and oxygen saturations 85% on room air. Findings of elevated creatinine 1.93. Cardiac enzymes were unremarkable. Chest x-ray completed showing moderate pulmonary vascular congestion suspect CHF. Echocardiogram with ejection fraction of 60% and moderate RV dilatation, moderate TR. Patient placed on intravenous Lasix with followup cardiology services monitored closely for volume overload. His volume status greatly improved and Lasix was changed to Demadex. Subcutaneous heparin added for DVT prophylaxis. Hospital course Pseudomonas UTI maintained on Cipro. Patient with multiple soft tissue masses on his back bilaterally primarily along the left flank hematoma and left leg that was chronic in nature. Noncontrast CT of abdomen and pelvis showed interval decrease in size of previously described right sided presumed subcutaneous hematomas. Noted increasing cellulitic changes lower tremor to his questionable cellulitis intravenous vancomycin was initiated 09/08/2012 x7 days. There was incidental finding of new acute/subacute L1 transverse process fracture which was discussed with neurosurgery [name] and advise conservative care and did not need outpatient followup with neurosurgery.    Showering No c/o today  Objective: Vital Signs: Blood pressure 141/78, pulse 84, temperature 97.6 F (36.4 C), temperature source Oral, resp. rate 18, weight 277 lb 9 oz (125.9 kg), SpO2 98.00%.   well-developed well-nourished male in no acute distress. HEENT exam atraumatic, normocephalic, neck supple without jugular venous distention. Chest without wheeze  but has prolonged expiratory time cardiac exam S1-S2 are regular. Abdominal exam overweight with bowel sounds, soft and nontender.   Assessment/Plan: 1. Functional deficits secondary to Decondtioning acute on chronic diastolic CHF  Medical Problem List and Plan:  1. Deconditioning related to CHF weight reduce from 127kg- 125.8kg and multi-medical, toxic encephalopathy improving  2. DVT Prophylaxis/Anticoagulation: Subcutaneous heparin. Monitor platelet counts any signs of bleeding  3. Pain Management: Neurontin 300 mg daily, Percocet as needed. Monitor mental status  4. Mood/delirium/depression. Prozac 20 mg daily. Provide emotional support and positive reinforcement  5. Neuropsych: This patient is capable of making decisions on his/her own behalf.  6. Hypertension/atrial fibrillation. Coreg 6.25 mg twice a day, Aldactone 25 mg daily, Demadex 40 mg daily. Monitor with increased mobility. No plans for chronic anti-coagulation secondary to fall risk and history of alcohol use  7. Pseudomonas UTI. Completed Cipro 8. Subcutaneous masses on lower back and lower tremor the felt to be chronic in nature. We'll continue to provide conservative care  9. Question cellulitis lower extremities.Off  Vancomycin 12/6 Monitor skin closely  10. Diabetes mellitus. Hemoglobin A1c of 5.8. Lantus insulin 10 units daily. Check blood sugars a.c. and at bedtime  11. Incidental finding of L1 transverse process fracture acute versus subacute. Conservative care advised as per neurosurgery [name]  12. Hypothyroidism. Synthroid  13. Hyperlipidemia. Zocor  14. Chronic renal insufficiency. Creatinine baseline 1.27-1.58. Followup chemistries  15. COPD. Check oxygen saturations every shift    LOS (Days) 8 A FACE TO FACE EVALUATION WAS PERFORMED  [name] 09/16/2012, 9:37 AM

## 2012-09-16 NOTE — Progress Notes (Signed)
Physical Therapy Session Note  Patient Details  Name: [name] MRN: [id number] Date of Birth: 1945/11/11  Today's Date: 09/16/2012 Time: 8295-6213 Time Calculation (min): 46 min  Short Term Goals: Week 1:  PT Short Term Goal 1 (Week 1): Pt will transfer bed>< w/c or recliner with assistance of 1 person. PT Short Term Goal 1 - Progress (Week 1): Met PT Short Term Goal 2 (Week 1): Pt will propel w/c x 50' with supervision, for general activity tolerance. PT Short Term Goal 2 - Progress (Week 1): Partly met PT Short Term Goal 3 (Week 1): Pt will perform gait x 20' with LRAD and assistance of 1 person. PT Short Term Goal 3 - Progress (Week 1): Met PT Short Term Goal 4 (Week 1): Pt will stand x 5 minutes during functional activity. PT Short Term Goal 4 - Progress (Week 1): Not met  Skilled Therapeutic Interventions/Progress Updates:    This session focused on short distance gait in room to bathroom with RW min guard assist 15' x2.  Dynamic balance during functional activity (washing hands at sink with one hand on sink and then no hands) min assist to prevent posterior LOB.  Gait with RW 150' down hallway min guard assist as he fatigued left leg started to shake and buckle.  Seated rest break to check O2 sats and verbal cues for pursed lip breathing.  VSS DOE 2/4.  Gait another 17' with RW min guard assist due to left leg fatigue.  Up and down stairs with bil rails reciprocally up min assist, and step to down min assist.  I tried to cue pt for step to leading with right leg up, but he was already up the stairs reciprocally with no increase in assistance stepping up with his left leg.  Returned to room via [facility] total assist-staff pushing.    Therapy Documentation Precautions:  Precautions Precautions: Fall Precaution Comments: 2L O2 Restrictions Weight Bearing Restrictions: No General:   Vital Signs: Therapy Vitals Temp: 97.3 F (36.3 C) Temp src: Oral Pulse Rate: 61  Resp: 18  BP:  114/68 mmHg Patient Position, if appropriate: Sitting Oxygen Therapy SpO2: 93 % O2 Device: Nasal cannula O2 Flow Rate (L/min): 2 L/min Pain: Pain Assessment Pain Assessment: 0-10 Pain Score: 0-No pain Pain Type: Chronic pain Pain Location: Leg Pain Orientation: Left Pain Intervention(s): Medication (See eMAR) Mobility:   Locomotion : Ambulation Ambulation/Gait Assistance: 4: Min guard Wheelchair Mobility Distance: 150   See FIM for current functional status  Therapy/Group: Individual Therapy  Lurena Joiner B. Gola Bribiesca, PT, DPT [phone]   09/16/2012, 5:24 PM

## 2012-09-17 ENCOUNTER — Inpatient Hospital Stay (HOSPITAL_COMMUNITY): Payer: Medicare Other | Admitting: Physical Therapy

## 2012-09-17 DIAGNOSIS — I4891 Unspecified atrial fibrillation: Secondary | ICD-10-CM

## 2012-09-17 DIAGNOSIS — R5381 Other malaise: Secondary | ICD-10-CM

## 2012-09-17 DIAGNOSIS — J9600 Acute respiratory failure, unspecified whether with hypoxia or hypercapnia: Secondary | ICD-10-CM

## 2012-09-17 LAB — GLUCOSE, CAPILLARY
Glucose-Capillary: 123 mg/dL — ABNORMAL HIGH (ref 70–99)
Glucose-Capillary: 136 mg/dL — ABNORMAL HIGH (ref 70–99)
Glucose-Capillary: 157 mg/dL — ABNORMAL HIGH (ref 70–99)

## 2012-09-17 LAB — BASIC METABOLIC PANEL
BUN: 61 mg/dL — ABNORMAL HIGH (ref 6–23)
CO2: 33 mEq/L — ABNORMAL HIGH (ref 19–32)
Calcium: 9.7 mg/dL (ref 8.4–10.5)
Creatinine, Ser: 1.36 mg/dL — ABNORMAL HIGH (ref 0.50–1.35)
GFR calc non Af Amer: 52 mL/min — ABNORMAL LOW (ref >90)
Glucose, Bld: 113 mg/dL — ABNORMAL HIGH (ref 70–99)

## 2012-09-17 NOTE — Progress Notes (Signed)
Physical Therapy Note  Patient Details  Name: [name] MRN: [id number] Date of Birth: [birth year] Today's Date: 09/17/2012  Time: 1100-1200 60 minutes  No c/o pain.  Treatment session focused on increasing activity tolerance through gait, exercise and functional activities.  Gait 120', 130' with close supervision with RW, no LOB.  Standing balance task with 1 UE support x 2 minutes for functional task, standing without UE support 3 x 30 seconds for functional tasks with supervision.  Standing therex 2 x 10 B LEs.  Sit to stand training initially supervision, becomes min A when more fatigued.  Pt O2 sats 89% after prolonged exercise on 2 L O2, returned to >90% quickly with seated rest.  Individual therapy   Reegan Bouffard 09/17/2012, 12:46 PM

## 2012-09-17 NOTE — Progress Notes (Signed)
Advanced Heart Failure Rounding Note  Subjective:  [name] is a 67 yo male with PMX s/f CAD (s/p CABG x 4 in 2001, normal Myoview 2010), chronic diastolic CHF, persistent a-fib, DM, HTN, HL, carotid artery disease, COPD, hypothyroidism, EtOH abuse, chronic venous insufficiency and morbid obesity.  He is not anticoagulants due to ETOH abuse and fall risk.   Echo 09/2011: LVEF 45-50%, mod biatral enlargement, moderate RV dilatation, moderate TR, PASP 66 mmHg.  ECHO 08/2012 EF 55-60% moderate bilateral RA/LA enlargement  Admitted 08/30/12 for acute on chronic diastolic CHF. He has been diuresed with IV lasix 40 mg bid.   Admit weight 278 but trending up to 282 pounds. After reviewing office note weights from the last 6 months his weight was 240-250 pounds.   Lasix drip stopped 08/2812. 09/07/12 CT showed multiple resolving SQ hematomas. D/C from acute care 09/08/12 to acute rehab weight at that time was 264 pounds. Switched to Toresemide 40 mg daily then titrated to 40 bid.  Diuresing well. Weight inaccurate.  Cr stable at 1.36  Denies SOB/PND/Orthopnea. Slow progress with Rehab.    Intake/Output Summary (Last 24 hours) at 09/17/12 1024 Last data filed at 09/17/12 0857  Gross per 24 hour  Intake   1080 ml  Output   1600 ml  Net   -520 ml    Current meds:    . aspirin  325 mg Oral Daily  . carvedilol  6.25 mg Oral BID WC  . FLUoxetine  20 mg Oral Daily  . folic acid  1 mg Oral Daily  . gabapentin  300 mg Oral Daily  . heparin subcutaneous  5,000 Units Subcutaneous Q8H  . insulin aspart  0-9 Units Subcutaneous TID WC  . insulin glargine  10 Units Subcutaneous QHS  . lactulose  30 g Oral Daily  . levothyroxine  150 mcg Oral QAC breakfast  . multivitamin with minerals  1 tablet Oral Daily  . potassium chloride  40 mEq Oral BID  . simvastatin  40 mg Oral QHS  . spironolactone  25 mg Oral Daily  . torsemide  40 mg Oral BID   Infusions:     Objective:  Blood pressure 127/75,  pulse 65, temperature 97.5 F (36.4 C), temperature source Oral, resp. rate 18, weight 125 kg (275 lb 9.2 oz), SpO2 94.00%. Weight change: -4.654 kg (-10 lb 4.2 oz)  Physical Exam: General:  Chronically ill. No resp difficulty HEENT: normal Neck: supple. JVP difficult to assess. Carotids 2+ bilat; no bruits. No lymphadenopathy or thryomegaly appreciated. Cor: PMI nondisplaced. Regular rate & rhythm. No rubs, gallops. 1/6 SEM RUSB. Lungs: Crackles at bases bilaterally on 2 liters [place] Abdomen: obese soft, nontender, nondistended. No hepatosplenomegaly. No bruits or masses. Good bowel sounds. Large irregular subQ masses on back Extremities: no cyanosis, clubbing, rash,  trace edema . Neuro: alert & orientedx3, cranial nerves grossly intact. moves all 4 extremities w/o difficulty. Affect pleasant   Basic Metabolic Panel:  Lab 09/17/12 1610 09/16/12 0500 09/15/12 0537 09/14/12 0930 09/14/12 0517  NA 137 135 136 137 136  K 4.7 4.6 -- -- --  CL 97 95* 96 95* 95*  CO2 33* 29 32 32 31  GLUCOSE 113* 128* 116* 119* 117*  BUN 61* 61* 65* 63* 66*  CREATININE 1.36* 1.19 1.44* 1.40* 1.44*  CALCIUM 9.7 9.4 9.7 9.6 9.5  MG -- -- -- -- --  PHOS -- -- -- -- --   Liver Function Tests: No results found for this  basename: AST:5,ALT:5,ALKPHOS:5,BILITOT:5,PROT:5,ALBUMIN:5 in the last 168 hours No results found for this basename: LIPASE:5,AMYLASE:5 in the last 168 hours No results found for this basename: AMMONIA:5 in the last 168 hours CBC: No results found for this basename: WBC:5,NEUTROABS:5,HGB:5,HCT:5,MCV:5,PLT:5 in the last 168 hours Cardiac Enzymes: No results found for this basename: CKTOTAL:5,CKMB:5,CKMBINDEX:5,TROPONINI:5 in the last 168 hours BNP: No components found with this basename: POCBNP:5 CBG:  Lab 09/17/12 0733 09/16/12 2038 09/16/12 1649 09/16/12 1159 09/16/12 0752  GLUCAP 112* 161* 106* 135* 111*   Microbiology: Lab Results  Component Value Date   CULT PSEUDOMONAS  AERUGINOSA 09/04/2012   CULT NO GROWTH 10/05/2011   CULT NO GROWTH 5 DAYS 10/05/2011   CULT NO GROWTH 5 DAYS 10/05/2011   CULT NO GROWTH 5 DAYS 06/25/2010   No results found for this basename: CULT:2,SDES:2 in the last 168 hours  Imaging: No results found.   ASSESSMENT:   1. A/C diastolic heart failure EF 55-60% RV moderately dilated/HK with RV-RA gradient 56 mmHg.  2. Right heart failure 3. A Fib 4. Hypothyroidism 5. Obesity 6. CAD 7. Hematoma 8. Acute renal failure (baseline creatinine 0.8-1.1): suspect cardiorenal  9. Hypokalemia 10. SubQ masses on lower back = hematomas (resolving) 11. LE cellulitis  PLAN/DISCUSSION  Volume status stable if not improved. Weights up and down. Renal function stable would continue current regimen.  Cellulitis improving. Off abx now.  Discharge from rehab planned for 09/22/12 Once discharged he will be followed in HF clinic.  Appreciate rehab services.   Theron Arista [facility] 09/17/2012 10:27 AM

## 2012-09-17 NOTE — Progress Notes (Signed)
Patient ID: [name], male   DOB: December 12, 1945, 66 y.o.   MRN: [id number]  Subjective/Complaints: male with history of COPD, atrial fibrillation but not a candidate for Coumadin secondary to history alcohol use, CAD with coronary artery bypass grafting 2001, left lower extremity chronic hematoma and chronic diastolic CHF. Admitted 08/30/2012 with altered mental status and oxygen saturations 85% on room air. Findings of elevated creatinine 1.93. Cardiac enzymes were unremarkable. Chest x-ray completed showing moderate pulmonary vascular congestion suspect CHF. Echocardiogram with ejection fraction of 60% and moderate RV dilatation, moderate TR. Patient placed on intravenous Lasix with followup cardiology services monitored closely for volume overload. His volume status greatly improved and Lasix was changed to Demadex. Subcutaneous heparin added for DVT prophylaxis. Hospital course Pseudomonas UTI maintained on Cipro. Patient with multiple soft tissue masses on his back bilaterally primarily along the left flank hematoma and left leg that was chronic in nature. Noncontrast CT of abdomen and pelvis showed interval decrease in size of previously described right sided presumed subcutaneous hematomas. Noted increasing cellulitic changes lower tremor to his questionable cellulitis intravenous vancomycin was initiated 09/08/2012 x7 days. There was incidental finding of new acute/subacute L1 transverse process fracture which was discussed with neurosurgery Dr. Phoebe Perch and advise conservative care and did not need outpatient followup with neurosurgery.    No complaints- feels well  Objective: Vital Signs: Blood pressure 127/75, pulse 65, temperature 97.5 F (36.4 C), temperature source Oral, resp. rate 18, weight 275 lb 9.2 oz (125 kg), SpO2 94.00%.   well-developed well-nourished male in no acute distress. HEENT exam atraumatic, normocephalic, neck supple without jugular venous distention. Chest without  wheeze but has prolonged expiratory time cardiac exam S1-S2 are regular. Abdominal exam overweight with bowel sounds, soft and nontender.   Assessment/Plan: 1. Functional deficits secondary to Decondtioning acute on chronic diastolic CHF  Medical Problem List and Plan:  1. Deconditioning related to CHF weight reduce from 127kg- 125.8kg and multi-medical, toxic encephalopathy improving  He is encouraged by progress 2. DVT Prophylaxis/Anticoagulation: Subcutaneous heparin. Monitor platelet counts any signs of bleeding  3. Pain Management: Neurontin 300 mg daily, Percocet as needed. Monitor mental status  4. Mood/delirium/depression. Prozac 20 mg daily.  5. Neuropsych: This patient is capable of making decisions on his/her own behalf.  6. Hypertension/atrial fibrillation. Coreg 6.25 mg twice a day, Aldactone 25 mg daily, Demadex 40 mg daily. Monitor with increased mobility. No plans for chronic anti-coagulation secondary to fall risk and history of alcohol use  7. Pseudomonas UTI. Completed Cipro 8. Subcutaneous masses on lower back and lower tremor the felt to be chronic in nature. We'll continue to provide conservative care  9. Question cellulitis lower extremities.Off  Vancomycin 12/6 Monitor skin closely - no concern for infection. 09/17/2012  10. Diabetes mellitus. Hemoglobin A1c of 5.8. Lantus insulin 10 units daily. Check blood sugars a.c. and at bedtime  11. Incidental finding of L1 transverse process fracture acute versus subacute. Conservative care advised as per neurosurgery Dr. Phoebe Perch  12. Hypothyroidism. Synthroid  13. Hyperlipidemia. Zocor  14. Chronic renal insufficiency. Creatinine baseline 1.27-1.58. Followup chemistries  15. COPD. Check oxygen saturations every shift    LOS (Days) 9 A FACE TO FACE EVALUATION WAS PERFORMED  SWORDS,BRUCE HENRY 09/17/2012, 9:08 AM

## 2012-09-18 ENCOUNTER — Inpatient Hospital Stay (HOSPITAL_COMMUNITY): Payer: Medicare Other

## 2012-09-18 ENCOUNTER — Inpatient Hospital Stay (HOSPITAL_COMMUNITY): Payer: Medicare Other | Admitting: Physical Therapy

## 2012-09-18 ENCOUNTER — Inpatient Hospital Stay (HOSPITAL_COMMUNITY): Payer: Medicare Other | Admitting: Occupational Therapy

## 2012-09-18 DIAGNOSIS — I509 Heart failure, unspecified: Secondary | ICD-10-CM

## 2012-09-18 DIAGNOSIS — R5381 Other malaise: Secondary | ICD-10-CM

## 2012-09-18 LAB — BASIC METABOLIC PANEL
Chloride: 94 mEq/L — ABNORMAL LOW (ref 96–112)
Creatinine, Ser: 1.53 mg/dL — ABNORMAL HIGH (ref 0.50–1.35)
GFR calc Af Amer: 52 mL/min — ABNORMAL LOW (ref >90)
Potassium: 4.8 mEq/L (ref 3.5–5.1)
Sodium: 134 mEq/L — ABNORMAL LOW (ref 135–145)

## 2012-09-18 LAB — GLUCOSE, CAPILLARY: Glucose-Capillary: 125 mg/dL — ABNORMAL HIGH (ref 70–99)

## 2012-09-18 MED ORDER — TORSEMIDE 20 MG PO TABS
80.0000 mg | ORAL_TABLET | Freq: Every day | ORAL | Status: DC
  Administered 2012-09-18 – 2012-09-21 (×4): 80 mg via ORAL
  Filled 2012-09-18 (×5): qty 4

## 2012-09-18 MED ORDER — BACITRACIN-NEOMYCIN-POLYMYXIN OINTMENT TUBE
TOPICAL_OINTMENT | Freq: Two times a day (BID) | CUTANEOUS | Status: DC
  Administered 2012-09-18 – 2012-09-20 (×6): via TOPICAL
  Filled 2012-09-18: qty 15

## 2012-09-18 NOTE — Progress Notes (Signed)
Complaining of raw area to right nare. Humidified O2 at 2L/M via [place]. Taking 2 percocet for complaint of pain in LLE. BLE's with 2 plus pitting edema. Spoke with patient about importance of weighing daily and recording. Patient agreeable. Requires assist with urinal. [name]

## 2012-09-18 NOTE — Progress Notes (Signed)
Physical Therapy Session Note  Patient Details  Name: [name] MRN: [id number] Date of Birth: [birth year]  Today's Date: 09/18/2012 Time: 1140-1210 and 1305-1400 Time Calculation (min): 30 min and 55 min  Short Term Goals: Week 2:= LTGs      Skilled Therapeutic Interventions/Progress Updates:  AM tx- Pain:- 8/10 L hip during ambulation; premedicated  Supine> sit in flat bed, without rail, supervision.  Sit> stand from raised bed, with supervision, stand step transfer with bariatric RW to w/c, supervision.  Simulated car transfer at sedan ht seat, with VCs, min assist to bring LLE onto high floor board, otherwise supervision.    Sit> stand from 20.5" high w/c, required 2 tries, VCs to scoot forward and transfer wt forward.  Gait x 30' with supervision, x 160' with supervision.  Pt c/o L hip pain by end of distance.  Pm tx-   Daughter did not come for family ed as planned.  SW stated daughter plans to come tomorrow.  Pain L hip 6/10; premedicated  Sit> stand from w/c this session: VCs, supervision 2/3 instances, min assist 1 instance as pt fatigued  In ADL apt, gait on tile and carpet in home situation, RW, supervision.  Standing endurance x 5 minutes during bil hand activity, folding clothes and placing clothes on hangers, with intermittent body support on kitchen counter, close supervision; seated rest. Gait x 25' on carpet to transport clothes to hang on hangers, supervision.  Upon sitting on EOB, pt c/o swimmy head, and requested to lie down. O2 sats 73%. Rising to 91% after 1-2 minutes, with cueing for deep breathing. RN informed  Bed mobility- sit> supine with min assist for LLE.  Rolling modified independent, and supine > sit with supervision for safety on high bed.  Therapeutic exercise performed with LE to increase strength for functional mobility: bil L and R sidestepping, cueing for neutral hip alignment.Marland Kitchen  Up/down 5 steps with 2 rails, min assist to fully  transfer wt and boost up onto RLE.  VCs for technique/sequencing.    Therapy Documentation Precautions:  Precautions Precautions: Fall Precaution Comments: attempting to wean off 2L O2 Restrictions Weight Bearing Restrictions: No   Vital Signs: Therapy Vitals Pulse Rate: 80  Patient Position, if appropriate: Sitting Oxygen Therapy SpO2: 88 % (after gait training in PT, AM) O2 Device: None (Room air) (O2 sats quickly rose to 91% after pt cued to breathe deeply)     See FIM for current functional status  Therapy/Group: Individual Therapy  Bentley Haralson 09/18/2012, 12:23 PM

## 2012-09-18 NOTE — Progress Notes (Signed)
Occupational Therapy Session Note  Patient Details  Name: [name] MRN: [id number] Date of Birth: 1946-09-20  Today's Date: 09/18/2012 Time: 1040-1140 Time Calculation (min): 60 min  Short Term Goals: Week 1:  OT Short Term Goal 1 (Week 1): Pt will be able to ambulate to the toilet with supervision. OT Short Term Goal 1 - Progress (Week 1): Met OT Short Term Goal 2 (Week 1): Pt will be able to sit to stand to pull up pants with supervision. OT Short Term Goal 2 - Progress (Week 1): Met OT Short Term Goal 3 (Week 1): Pt will be able to use a long sponge to bathe with min assist. OT Short Term Goal 3 - Progress (Week 1): Met OT Short Term Goal 4 (Week 1): Pt will be able to don pants with mod assist. OT Short Term Goal 4 - Progress (Week 1): Met Week 2:  OT Short Term Goal 1 (Week 2): Pt will complete a tub bench or a shower stall transfer with min assist. OT Short Term Goal 1 - Progress (Week 2): Progressing toward goal OT Short Term Goal 2 (Week 2): Pt will don pants with min assist. OT Short Term Goal 2 - Progress (Week 2): Progressing toward goal OT Short Term Goal 3 (Week 2): Pt will toilet with min assist (needing assist for cleanup only).  Skilled Therapeutic Interventions/Progress Updates:    Pt seen for bathing skills using AE while sitting on BSC in bathroom to work on his activity tolerance.  Pt was off of the oxygen during the session and maintained sats at 94%.  Supervision with all mobility, except for min assist to lift left leg onto bed.  Bathing min assist for front perineal area and buttocks.  Therapy Documentation Precautions:  Precautions Precautions: Fall Precaution Comments: 2L O2 Restrictions Weight Bearing Restrictions: No Oxygen Therapy SpO2: 97 % O2 Device: None (Room air)  Pain: No complaints of pain   ADL:  See FIM for current functional status  Therapy/Group: Individual Therapy  Brogan England 09/18/2012, 11:55 AM

## 2012-09-18 NOTE — Progress Notes (Signed)
Advanced Heart Failure Rounding Note  Subjective:  [name] is a 66 yo male with PMX s/f CAD (s/p CABG x 4 in 2001, normal Myoview 2010), chronic diastolic CHF, persistent a-fib, DM, HTN, HL, carotid artery disease, COPD, hypothyroidism, EtOH abuse, chronic venous insufficiency and morbid obesity.  He is not anticoagulants due to ETOH abuse and fall risk.   Echo 09/2011: LVEF 45-50%, mod biatral enlargement, moderate RV dilatation, moderate TR, PASP 66 mmHg.  ECHO 08/2012 EF 55-60% moderate bilateral RA/LA enlargement  Admitted 08/30/12 for acute on chronic diastolic CHF. He has been diuresed with IV lasix 40 mg bid.   Admit weight 278 but trending up to 282 pounds. After reviewing office note weights from the last 6 months his weight was 240-250 pounds.   Lasix drip stopped 08/2812. 09/07/12 CT showed multiple resolving SQ hematomas. D/C from acute care 09/08/12 to acute rehab weight at that time was 264 pounds. Switched to Toresemide 40 mg daily then titrated to 40 bid.  Weight stable at 276-277 (different scale from admit)  Denies SOB/PND/Orthopnea. Progressing slowly with CIR.   Intake/Output Summary (Last 24 hours) at 09/18/12 0659 Last data filed at 09/18/12 0535  Gross per 24 hour  Intake    960 ml  Output    850 ml  Net    110 ml    Current meds:    . aspirin  325 mg Oral Daily  . carvedilol  6.25 mg Oral BID WC  . FLUoxetine  20 mg Oral Daily  . folic acid  1 mg Oral Daily  . gabapentin  300 mg Oral Daily  . heparin subcutaneous  5,000 Units Subcutaneous Q8H  . insulin aspart  0-9 Units Subcutaneous TID WC  . insulin glargine  10 Units Subcutaneous QHS  . lactulose  30 g Oral Daily  . levothyroxine  150 mcg Oral QAC breakfast  . multivitamin with minerals  1 tablet Oral Daily  . potassium chloride  40 mEq Oral BID  . simvastatin  40 mg Oral QHS  . spironolactone  25 mg Oral Daily  . torsemide  40 mg Oral BID   Infusions:     Objective:  Blood pressure  107/55, pulse 67, temperature 97.7 F (36.5 C), temperature source Oral, resp. rate 17, weight 125.9 kg (277 lb 9 oz), SpO2 97.00%. Weight change: 4.654 kg (10 lb 4.2 oz)  Physical Exam: General:  Chronically ill. No resp difficulty HEENT: normal Neck: supple. JVP difficult to assess. Carotids 2+ bilat; no bruits. No lymphadenopathy or thryomegaly appreciated. Cor: PMI nondisplaced. Regular rate & rhythm. No rubs, gallops. 1/6 SEM RUSB. Lungs: Crackles at bases bilaterally on 2 liters [place] Abdomen: obese soft, nontender, nondistended. No hepatosplenomegaly. No bruits or masses. Good bowel sounds. Large irregular subQ masses on back Extremities: no cyanosis, clubbing, rash,  trace edema . Neuro: alert & orientedx3, cranial nerves grossly intact. moves all 4 extremities w/o difficulty. Affect pleasant   Basic Metabolic Panel:  Lab 09/17/12 7829 09/16/12 0500 09/15/12 0537 09/14/12 0930 09/14/12 0517  NA 137 135 136 137 136  K 4.7 4.6 -- -- --  CL 97 95* 96 95* 95*  CO2 33* 29 32 32 31  GLUCOSE 113* 128* 116* 119* 117*  BUN 61* 61* 65* 63* 66*  CREATININE 1.36* 1.19 1.44* 1.40* 1.44*  CALCIUM 9.7 9.4 9.7 9.6 9.5  MG -- -- -- -- --  PHOS -- -- -- -- --   Liver Function Tests: No results found for  this basename: AST:5,ALT:5,ALKPHOS:5,BILITOT:5,PROT:5,ALBUMIN:5 in the last 168 hours No results found for this basename: LIPASE:5,AMYLASE:5 in the last 168 hours No results found for this basename: AMMONIA:5 in the last 168 hours CBC: No results found for this basename: WBC:5,NEUTROABS:5,HGB:5,HCT:5,MCV:5,PLT:5 in the last 168 hours Cardiac Enzymes: No results found for this basename: CKTOTAL:5,CKMB:5,CKMBINDEX:5,TROPONINI:5 in the last 168 hours BNP: No components found with this basename: POCBNP:5 CBG:  Lab 09/17/12 2013 09/17/12 1643 09/17/12 1202 09/17/12 0733 09/16/12 2038  GLUCAP 157* 136* 123* 112* 161*   Microbiology: Lab Results  Component Value Date   CULT PSEUDOMONAS  AERUGINOSA 09/04/2012   CULT NO GROWTH 10/05/2011   CULT NO GROWTH 5 DAYS 10/05/2011   CULT NO GROWTH 5 DAYS 10/05/2011   CULT NO GROWTH 5 DAYS 06/25/2010   No results found for this basename: CULT:2,SDES:2 in the last 168 hours  Imaging: No results found.   ASSESSMENT:   1. A/C diastolic heart failure EF 55-60% RV moderately dilated/HK with RV-RA gradient 56 mmHg.  2. Right heart failure 3. A Fib 4. Hypothyroidism 5. Obesity 6. CAD 7. Hematoma 8. Acute renal failure (baseline creatinine 0.8-1.1): suspect cardiorenal  9. Hypokalemia 10. SubQ masses on lower back = hematomas (resolving) 11. LE cellulitis  PLAN/DISCUSSION  Weight back to about baseline but edema looks much better. Will switch demadex to once daily and see if that keeps weight stable/imporved.  Cellulitis improving. Off abx now.  Discharge from rehab planned for 09/22/12 Once discharged he will be followed in HF clinic.  Appreciate rehab services.   Truman Hayward, 09/18/2012 6:59 AM

## 2012-09-18 NOTE — Plan of Care (Signed)
Problem: RH BLADDER ELIMINATION Goal: RH STG MANAGE BLADDER WITH ASSISTANCE STG Manage Bladder With minimal Assistance  Outcome: Completed/Met Date Met:  09/18/12 Pt is mod I with bladder elimination using toilet  Problem: RH PAIN MANAGEMENT Goal: RH STG PAIN MANAGED AT OR BELOW PT'S PAIN GOAL 3 or less  Outcome: Not Progressing Pain scores are 7-10; goal updated to reflect pt's goal

## 2012-09-18 NOTE — Progress Notes (Signed)
Patient ID: [name], male   DOB: 08-24-1946, 66 y.o.   MRN: [id number]  Subjective/Complaints: male with history of COPD, atrial fibrillation but not a candidate for Coumadin secondary to history alcohol use, CAD with coronary artery bypass grafting 2001, left lower extremity chronic hematoma and chronic diastolic CHF. Admitted 08/30/2012 with altered mental status and oxygen saturations 85% on room air. Findings of elevated creatinine 1.93. Cardiac enzymes were unremarkable. Chest x-ray completed showing moderate pulmonary vascular congestion suspect CHF. Echocardiogram with ejection fraction of 60% and moderate RV dilatation, moderate TR. Patient placed on intravenous Lasix with followup cardiology services monitored closely for volume overload. His volume status greatly improved and Lasix was changed to Demadex. Subcutaneous heparin added for DVT prophylaxis. Hospital course Pseudomonas UTI maintained on Cipro. Patient with multiple soft tissue masses on his back bilaterally primarily along the left flank hematoma and left leg that was chronic in nature. Noncontrast CT of abdomen and pelvis showed interval decrease in size of previously described right sided presumed subcutaneous hematomas. Noted increasing cellulitic changes lower tremor to his questionable cellulitis intravenous vancomycin was initiated 09/08/2012 x7 days. There was incidental finding of new acute/subacute L1 transverse process fracture which was discussed with neurosurgery Dr. Phoebe Perch and advise conservative care and did not need outpatient followup with neurosurgery.    I discussed diuretics with cardiology Soreness R nares from O2 wears only at noc when at home  Objective: Vital Signs: Blood pressure 107/55, pulse 67, temperature 97.7 F (36.5 C), temperature source Oral, resp. rate 17, weight 125.9 kg (277 lb 9 oz), SpO2 97.00%. No results found. Results for orders placed during the hospital encounter of 09/08/12 (from  the past 72 hour(s))  GLUCOSE, CAPILLARY     Status: Abnormal   Collection Time   09/15/12 11:34 AM      Component Value Range Comment   Glucose-Capillary 125 (*) 70 - 99 mg/dL    Comment 1 Notify RN     GLUCOSE, CAPILLARY     Status: Abnormal   Collection Time   09/15/12  4:27 PM      Component Value Range Comment   Glucose-Capillary 121 (*) 70 - 99 mg/dL   GLUCOSE, CAPILLARY     Status: Abnormal   Collection Time   09/15/12  9:14 PM      Component Value Range Comment   Glucose-Capillary 175 (*) 70 - 99 mg/dL    Comment 1 Notify RN     BASIC METABOLIC PANEL     Status: Abnormal   Collection Time   09/16/12  5:00 AM      Component Value Range Comment   Sodium 135  135 - 145 mEq/L    Potassium 4.6  3.5 - 5.1 mEq/L    Chloride 95 (*) 96 - 112 mEq/L    CO2 29  19 - 32 mEq/L    Glucose, Bld 128 (*) 70 - 99 mg/dL    BUN 61 (*) 6 - 23 mg/dL    Creatinine, Ser 4.09  0.50 - 1.35 mg/dL    Calcium 9.4  8.4 - 81.1 mg/dL    GFR calc non Af Amer 61 (*) >90 mL/min    GFR calc Af Amer 71 (*) >90 mL/min   GLUCOSE, CAPILLARY     Status: Abnormal   Collection Time   09/16/12  7:52 AM      Component Value Range Comment   Glucose-Capillary 111 (*) 70 - 99 mg/dL    Comment 1 Notify  RN     GLUCOSE, CAPILLARY     Status: Abnormal   Collection Time   09/16/12 11:59 AM      Component Value Range Comment   Glucose-Capillary 135 (*) 70 - 99 mg/dL    Comment 1 Notify RN     GLUCOSE, CAPILLARY     Status: Abnormal   Collection Time   09/16/12  4:49 PM      Component Value Range Comment   Glucose-Capillary 106 (*) 70 - 99 mg/dL    Comment 1 Notify RN     GLUCOSE, CAPILLARY     Status: Abnormal   Collection Time   09/16/12  8:38 PM      Component Value Range Comment   Glucose-Capillary 161 (*) 70 - 99 mg/dL    Comment 1 Notify RN     BASIC METABOLIC PANEL     Status: Abnormal   Collection Time   09/17/12  5:05 AM      Component Value Range Comment   Sodium 137  135 - 145 mEq/L    Potassium 4.7   3.5 - 5.1 mEq/L    Chloride 97  96 - 112 mEq/L    CO2 33 (*) 19 - 32 mEq/L    Glucose, Bld 113 (*) 70 - 99 mg/dL    BUN 61 (*) 6 - 23 mg/dL    Creatinine, Ser 1.61 (*) 0.50 - 1.35 mg/dL    Calcium 9.7  8.4 - 09.6 mg/dL    GFR calc non Af Amer 52 (*) >90 mL/min    GFR calc Af Amer 60 (*) >90 mL/min   GLUCOSE, CAPILLARY     Status: Abnormal   Collection Time   09/17/12  7:33 AM      Component Value Range Comment   Glucose-Capillary 112 (*) 70 - 99 mg/dL    Comment 1 Notify RN     GLUCOSE, CAPILLARY     Status: Abnormal   Collection Time   09/17/12 12:02 PM      Component Value Range Comment   Glucose-Capillary 123 (*) 70 - 99 mg/dL    Comment 1 Notify RN     GLUCOSE, CAPILLARY     Status: Abnormal   Collection Time   09/17/12  4:43 PM      Component Value Range Comment   Glucose-Capillary 136 (*) 70 - 99 mg/dL    Comment 1 Notify RN     GLUCOSE, CAPILLARY     Status: Abnormal   Collection Time   09/17/12  8:13 PM      Component Value Range Comment   Glucose-Capillary 157 (*) 70 - 99 mg/dL    Comment 1 Notify RN     BASIC METABOLIC PANEL     Status: Abnormal   Collection Time   09/18/12  5:35 AM      Component Value Range Comment   Sodium 134 (*) 135 - 145 mEq/L    Potassium 4.8  3.5 - 5.1 mEq/L    Chloride 94 (*) 96 - 112 mEq/L    CO2 31  19 - 32 mEq/L    Glucose, Bld 96  70 - 99 mg/dL    BUN 67 (*) 6 - 23 mg/dL    Creatinine, Ser 0.45 (*) 0.50 - 1.35 mg/dL    Calcium 9.5  8.4 - 40.9 mg/dL    GFR calc non Af Amer 45 (*) >90 mL/min    GFR calc Af Amer 52 (*) >90 mL/min  GLUCOSE, CAPILLARY     Status: Abnormal   Collection Time   09/18/12  7:27 AM      Component Value Range Comment   Glucose-Capillary 105 (*) 70 - 99 mg/dL    Comment 1 Notify RN        HEENT: normal, nares without breakdown Cardio: irregular Resp: CTA B/L GI: BS positive Extremity:  Edema bilateral pretibial 2+ Skin:   Other stasis dermatitis bilateral pretib, no erythema Neuro: Alert/Oriented,  Abnormal Sensory diminished LT in feet, Abnormal Motor 4/5 in BUEs and RLE, 3-/5 in LLE and Other Hearing impaired Musc/Skel:  No pain with AROM of all 4 ext Gen NAD   Assessment/Plan: 1. Functional deficits secondary to Decondtioning acute on chronic diastolic CHF which require 3+ hours per day of interdisciplinary therapy in a comprehensive inpatient rehab setting. Physiatrist is providing close team supervision and 24 hour management of active medical problems listed below. Physiatrist and rehab team continue to assess barriers to discharge/monitor patient progress toward functional and medical goals. FIM: FIM - Bathing Bathing Steps Patient Completed: Chest;Right Arm;Left Arm;Abdomen;Right upper leg;Left upper leg;Right lower leg (including foot);Left lower leg (including foot) (with long sponge) Bathing:  (refused bath)  FIM - Upper Body Dressing/Undressing Upper body dressing/undressing steps patient completed: Thread/unthread right sleeve of pullover shirt/dresss;Thread/unthread left sleeve of pullover shirt/dress;Put head through opening of pull over shirt/dress Upper body dressing/undressing: 0: Wears gown/pajamas-no public clothing FIM - Lower Body Dressing/Undressing Lower body dressing/undressing steps patient completed: Pull underwear up/down;Thread/unthread right pants leg;Thread/unthread left pants leg;Pull pants up/down Lower body dressing/undressing: 0: Wears Oceanographer  FIM - Hotel manager Devices: Grab bar or rail for support Toileting: 1: Total-Patient completed zero steps, helper did all 3  FIM - Diplomatic Services operational officer Devices: Best boy Transfers: 3-To toilet/BSC: Mod A (lift or lower assist);4-From toilet/BSC: Min A (steadying Pt. > 75%)  FIM - Bed/Chair Transfer Bed/Chair Transfer Assistive Devices: Manufacturing systems engineer Transfer: 3: Supine > Sit: Mod A (lifting assist/Pt. 50-74%/lift 2  legs  FIM - Locomotion: Wheelchair Distance: 150 Locomotion: Wheelchair: 1: Total Assistance/staff pushes wheelchair (Pt<25%) FIM - Locomotion: Ambulation Locomotion: Ambulation Assistive Devices: Designer, industrial/product Ambulation/Gait Assistance: 4: Min guard Locomotion: Ambulation: 4: Travels 150 ft or more with minimal assistance (Pt.>75%)  Comprehension Comprehension Mode: Auditory Comprehension: 5-Understands complex 90% of the time/Cues < 10% of the time  Expression Expression Mode: Verbal Expression: 5-Expresses basic needs/ideas: With extra time/assistive device  Social Interaction Social Interaction Mode: Asleep Social Interaction: 5-Interacts appropriately 90% of the time - Needs monitoring or encouragement for participation or interaction.  Problem Solving Problem Solving Mode: Asleep Problem Solving: 5-Solves complex 90% of the time/cues < 10% of the time  Memory Memory Mode: Asleep Memory Assistive Devices: Memory book Memory: 4-Recognizes or recalls 75 - 89% of the time/requires cueing 10 - 24% of the time  Medical Problem List and Plan:  1. Deconditioning related to CHF weight reduce from 127kg- 125.8kg and multi-medical, toxic encephalopathy improving  2. DVT Prophylaxis/Anticoagulation: Subcutaneous heparin. Monitor platelet counts any signs of bleeding  3. Pain Management: Neurontin 300 mg daily, Percocet as needed. Monitor mental status  4. Mood/delirium/depression. Prozac 20 mg daily. Provide emotional support and positive reinforcement  5. Neuropsych: This patient is capable of making decisions on his/her own behalf.  6. Hypertension/atrial fibrillation. Coreg 6.25 mg twice a day, Aldactone 25 mg daily, Demadex 40 mg daily. Monitor with increased mobility. No plans for chronic anti-coagulation secondary to fall risk  and history of alcohol use  7. Pseudomonas UTI. Completed Cipro 8. Subcutaneous masses on lower back and lower tremor the felt to be chronic in  nature. We'll continue to provide conservative care  9. Question cellulitis lower extremities.Off  Vancomycin 12/6 Monitor skin closely  10. Diabetes mellitus. Hemoglobin A1c of 5.8. Lantus insulin 10 units daily. Check blood sugars a.c. and at bedtime  11. Incidental finding of L1 transverse process fracture acute versus subacute. Conservative care advised as per neurosurgery Dr. Phoebe Perch  12. Hypothyroidism. Synthroid  13. Hyperlipidemia. Zocor  14. Chronic renal insufficiency. Creatinine baseline 1.27-1.58. Followup chemistries  15. COPD. Check oxygen saturations every shift    LOS (Days) 10 A FACE TO FACE EVALUATION WAS PERFORMED  KIRSTEINS,ANDREW E 09/18/2012, 8:53 AM

## 2012-09-18 NOTE — Progress Notes (Signed)
Physical Therapy Note  Patient Details  Name: [name] MRN: [id number] Date of Birth: 02/27/1946 Today's Date: 09/18/2012  1445- 1525 (40 minutes) individual Pain: no complaint of pain Focus of treatment: sit to stand from varying heights (quad strengthening); standing balance Treatment: sit to stand from 22 inches X 5 with tactile cues for forward lean min assist; standing with minimal reciprocal challenges (pt easily displaced posteriorly); standing from 20 inches X 3 mod assist (Oxygen sats > 92% during session on RA).   Daysi Boggan,JIM 09/18/2012, 7:24 AM

## 2012-09-19 ENCOUNTER — Inpatient Hospital Stay (HOSPITAL_COMMUNITY): Payer: Medicare Other | Admitting: Occupational Therapy

## 2012-09-19 ENCOUNTER — Inpatient Hospital Stay (HOSPITAL_COMMUNITY): Payer: Medicare Other

## 2012-09-19 LAB — BASIC METABOLIC PANEL
BUN: 73 mg/dL — ABNORMAL HIGH (ref 6–23)
CO2: 29 mEq/L (ref 19–32)
Chloride: 94 mEq/L — ABNORMAL LOW (ref 96–112)
GFR calc non Af Amer: 42 mL/min — ABNORMAL LOW (ref >90)
Glucose, Bld: 120 mg/dL — ABNORMAL HIGH (ref 70–99)
Potassium: 4.9 mEq/L (ref 3.5–5.1)
Sodium: 132 mEq/L — ABNORMAL LOW (ref 135–145)

## 2012-09-19 LAB — URINALYSIS, ROUTINE W REFLEX MICROSCOPIC
Bilirubin Urine: NEGATIVE
Hgb urine dipstick: NEGATIVE
Ketones, ur: NEGATIVE mg/dL
Nitrite: NEGATIVE
Protein, ur: NEGATIVE mg/dL
Specific Gravity, Urine: 1.009 (ref 1.005–1.030)
Urobilinogen, UA: 1 mg/dL (ref 0.0–1.0)

## 2012-09-19 LAB — GLUCOSE, CAPILLARY
Glucose-Capillary: 121 mg/dL — ABNORMAL HIGH (ref 70–99)
Glucose-Capillary: 119 mg/dL — ABNORMAL HIGH (ref 70–99)
Glucose-Capillary: 114 mg/dL — ABNORMAL HIGH (ref 70–99)

## 2012-09-19 MED ORDER — DIPHENHYDRAMINE-ZINC ACETATE 2-0.1 % EX CREA
TOPICAL_CREAM | Freq: Two times a day (BID) | CUTANEOUS | Status: DC | PRN
  Administered 2012-09-20 (×3): via TOPICAL
  Filled 2012-09-19 (×2): qty 28

## 2012-09-19 NOTE — Progress Notes (Signed)
Patient sat 93% on RA

## 2012-09-19 NOTE — Progress Notes (Signed)
Occupational Therapy Session Note  Patient Details  Name: [name] MRN: [id number] Date of Birth: 07-15-1946  Today's Date: 09/19/2012 Time: 0347-4259 Time Calculation (min): 60 min  Short Term Goals: Week 1:  OT Short Term Goal 1 (Week 1): Pt will be able to ambulate to the toilet with supervision. OT Short Term Goal 1 - Progress (Week 1): Met OT Short Term Goal 2 (Week 1): Pt will be able to sit to stand to pull up pants with supervision. OT Short Term Goal 2 - Progress (Week 1): Met OT Short Term Goal 3 (Week 1): Pt will be able to use a long sponge to bathe with min assist. OT Short Term Goal 3 - Progress (Week 1): Met OT Short Term Goal 4 (Week 1): Pt will be able to don pants with mod assist. OT Short Term Goal 4 - Progress (Week 1): Met Week 2:  OT Short Term Goal 1 (Week 2): Pt will complete a tub bench or a shower stall transfer with min assist. OT Short Term Goal 1 - Progress (Week 2): Progressing toward goal OT Short Term Goal 2 (Week 2): Pt will don pants with min assist. OT Short Term Goal 2 - Progress (Week 2): Progressing toward goal OT Short Term Goal 3 (Week 2): Pt will toilet with min assist (needing assist for cleanup only).  Skilled Therapeutic Interventions/Progress Updates:      Pt seen for BADL retraining of toileting, bathing, and dressing with a focus on standing balance and tolerance with family education with patients daughter.  Pt was agreeable to donning clothing today and was able to don underwear and pants without assist. Assist with shoes. Discussed and demonstrated what patient is capable of and how the daughter should assist him at home.  She was very pleased with his progress. They do not need any OT equipment.  Therapy Documentation Precautions:  Precautions Precautions: Fall Precaution Comments: 2L O2 Restrictions Weight Bearing Restrictions: No   Pain: No c/o pain  ADL:  See FIM for current functional status  Therapy/Group:  Individual Therapy  SAGUIER,JULIA 09/19/2012, 11:51 AM

## 2012-09-19 NOTE — Progress Notes (Signed)
Patient ID: [name], male   DOB: 1946/01/01, 66 y.o.   MRN: [id number]  Subjective/Complaints: male with history of COPD, atrial fibrillation but not a candidate for Coumadin secondary to history alcohol use, CAD with coronary artery bypass grafting 2001, left lower extremity chronic hematoma and chronic diastolic CHF. Admitted 08/30/2012 with altered mental status and oxygen saturations 85% on room air. Findings of elevated creatinine 1.93. Cardiac enzymes were unremarkable. Chest x-ray completed showing moderate pulmonary vascular congestion suspect CHF. Echocardiogram with ejection fraction of 60% and moderate RV dilatation, moderate TR. Patient placed on intravenous Lasix with followup cardiology services monitored closely for volume overload. His volume status greatly improved and Lasix was changed to Demadex. Subcutaneous heparin added for DVT prophylaxis. Hospital course Pseudomonas UTI maintained on Cipro. Patient with multiple soft tissue masses on his back bilaterally primarily along the left flank hematoma and left leg that was chronic in nature. Noncontrast CT of abdomen and pelvis showed interval decrease in size of previously described right sided presumed subcutaneous hematomas. Noted increasing cellulitic changes lower tremor to his questionable cellulitis intravenous vancomycin was initiated 09/08/2012 x7 days. There was incidental finding of new acute/subacute L1 transverse process fracture which was discussed with neurosurgery Dr. Phoebe Perch and advise conservative care and did not need outpatient followup with neurosurgery.    No longer wearing O2. Oxygen saturations are good. Occasionally desaturates with activity but this response to deep breathing  Objective: Vital Signs: Blood pressure 107/64, pulse 61, temperature 97.7 F (36.5 C), temperature source Oral, resp. rate 18, weight 126.3 kg (278 lb 7.1 oz), SpO2 95.00%. No results found. Results for orders placed during the  hospital encounter of 09/08/12 (from the past 72 hour(s))  GLUCOSE, CAPILLARY     Status: Abnormal   Collection Time   09/16/12  4:49 PM      Component Value Range Comment   Glucose-Capillary 106 (*) 70 - 99 mg/dL    Comment 1 Notify RN     GLUCOSE, CAPILLARY     Status: Abnormal   Collection Time   09/16/12  8:38 PM      Component Value Range Comment   Glucose-Capillary 161 (*) 70 - 99 mg/dL    Comment 1 Notify RN     BASIC METABOLIC PANEL     Status: Abnormal   Collection Time   09/17/12  5:05 AM      Component Value Range Comment   Sodium 137  135 - 145 mEq/L    Potassium 4.7  3.5 - 5.1 mEq/L    Chloride 97  96 - 112 mEq/L    CO2 33 (*) 19 - 32 mEq/L    Glucose, Bld 113 (*) 70 - 99 mg/dL    BUN 61 (*) 6 - 23 mg/dL    Creatinine, Ser 1.19 (*) 0.50 - 1.35 mg/dL    Calcium 9.7  8.4 - 14.7 mg/dL    GFR calc non Af Amer 52 (*) >90 mL/min    GFR calc Af Amer 60 (*) >90 mL/min   GLUCOSE, CAPILLARY     Status: Abnormal   Collection Time   09/17/12  7:33 AM      Component Value Range Comment   Glucose-Capillary 112 (*) 70 - 99 mg/dL    Comment 1 Notify RN     GLUCOSE, CAPILLARY     Status: Abnormal   Collection Time   09/17/12 12:02 PM      Component Value Range Comment   Glucose-Capillary 123 (*)  70 - 99 mg/dL    Comment 1 Notify RN     GLUCOSE, CAPILLARY     Status: Abnormal   Collection Time   09/17/12  4:43 PM      Component Value Range Comment   Glucose-Capillary 136 (*) 70 - 99 mg/dL    Comment 1 Notify RN     GLUCOSE, CAPILLARY     Status: Abnormal   Collection Time   09/17/12  8:13 PM      Component Value Range Comment   Glucose-Capillary 157 (*) 70 - 99 mg/dL    Comment 1 Notify RN     BASIC METABOLIC PANEL     Status: Abnormal   Collection Time   09/18/12  5:35 AM      Component Value Range Comment   Sodium 134 (*) 135 - 145 mEq/L    Potassium 4.8  3.5 - 5.1 mEq/L    Chloride 94 (*) 96 - 112 mEq/L    CO2 31  19 - 32 mEq/L    Glucose, Bld 96  70 - 99 mg/dL     BUN 67 (*) 6 - 23 mg/dL    Creatinine, Ser 1.61 (*) 0.50 - 1.35 mg/dL    Calcium 9.5  8.4 - 09.6 mg/dL    GFR calc non Af Amer 45 (*) >90 mL/min    GFR calc Af Amer 52 (*) >90 mL/min   GLUCOSE, CAPILLARY     Status: Abnormal   Collection Time   09/18/12  7:27 AM      Component Value Range Comment   Glucose-Capillary 105 (*) 70 - 99 mg/dL    Comment 1 Notify RN     GLUCOSE, CAPILLARY     Status: Abnormal   Collection Time   09/18/12 11:29 AM      Component Value Range Comment   Glucose-Capillary 125 (*) 70 - 99 mg/dL    Comment 1 Notify RN     GLUCOSE, CAPILLARY     Status: Abnormal   Collection Time   09/18/12  4:03 PM      Component Value Range Comment   Glucose-Capillary 126 (*) 70 - 99 mg/dL   GLUCOSE, CAPILLARY     Status: Abnormal   Collection Time   09/18/12  8:23 PM      Component Value Range Comment   Glucose-Capillary 154 (*) 70 - 99 mg/dL    Comment 1 Notify RN     BASIC METABOLIC PANEL     Status: Abnormal   Collection Time   09/19/12  5:00 AM      Component Value Range Comment   Sodium 132 (*) 135 - 145 mEq/L    Potassium 4.9  3.5 - 5.1 mEq/L    Chloride 94 (*) 96 - 112 mEq/L    CO2 29  19 - 32 mEq/L    Glucose, Bld 120 (*) 70 - 99 mg/dL    BUN 73 (*) 6 - 23 mg/dL    Creatinine, Ser 0.45 (*) 0.50 - 1.35 mg/dL    Calcium 9.5  8.4 - 40.9 mg/dL    GFR calc non Af Amer 42 (*) >90 mL/min    GFR calc Af Amer 49 (*) >90 mL/min   GLUCOSE, CAPILLARY     Status: Abnormal   Collection Time   09/19/12  7:37 AM      Component Value Range Comment   Glucose-Capillary 121 (*) 70 - 99 mg/dL    Comment 1 Notify RN  GLUCOSE, CAPILLARY     Status: Abnormal   Collection Time   09/19/12 11:22 AM      Component Value Range Comment   Glucose-Capillary 119 (*) 70 - 99 mg/dL    Comment 1 Notify RN     GLUCOSE, CAPILLARY     Status: Abnormal   Collection Time   09/19/12  4:25 PM      Component Value Range Comment   Glucose-Capillary 114 (*) 70 - 99 mg/dL      HEENT:  normal, nares without breakdown Cardio: irregular Resp: CTA B/L GI: BS positive Extremity:  Edema bilateral pretibial 2+ Skin:   Other stasis dermatitis bilateral pretib, no erythema Neuro: Alert/Oriented, Abnormal Sensory diminished LT in feet, Abnormal Motor 4/5 in BUEs and RLE, 3-/5 in LLE and Other Hearing impaired Musc/Skel:  No pain with AROM of all 4 ext Gen NAD   Assessment/Plan: 1. Functional deficits secondary to Decondtioning acute on chronic diastolic CHF which require 3+ hours per day of interdisciplinary therapy in a comprehensive inpatient rehab setting. Physiatrist is providing close team supervision and 24 hour management of active medical problems listed below. Physiatrist and rehab team continue to assess barriers to discharge/monitor patient progress toward functional and medical goals. FIM: FIM - Bathing Bathing Steps Patient Completed: Chest;Right Arm;Left Arm;Abdomen;Right upper leg;Left upper leg;Right lower leg (including foot);Left lower leg (including foot) (with long sponge) Bathing:  (refused bath)  FIM - Upper Body Dressing/Undressing Upper body dressing/undressing steps patient completed: Thread/unthread right sleeve of pullover shirt/dresss;Thread/unthread left sleeve of pullover shirt/dress;Put head through opening of pull over shirt/dress;Pull shirt over trunk Upper body dressing/undressing: 5: Set-up assist to: Obtain clothing/put away FIM - Lower Body Dressing/Undressing Lower body dressing/undressing steps patient completed: Thread/unthread right underwear leg;Thread/unthread left underwear leg;Pull underwear up/down;Thread/unthread right pants leg;Thread/unthread left pants leg;Pull pants up/down Lower body dressing/undressing: 4: Min-Patient completed 75 plus % of tasks  FIM - Toileting Toileting steps completed by patient: Adjust clothing prior to toileting;Adjust clothing after toileting Toileting Assistive Devices: Grab bar or rail for  support Toileting: 3: Mod-Patient completed 2 of 3 steps  FIM - Diplomatic Services operational officer Devices: Elevated toilet seat;Grab bars;Walker Toilet Transfers: 5-To toilet/BSC: Supervision (verbal cues/safety issues);5-From toilet/BSC: Supervision (verbal cues/safety issues)  FIM - Banker Devices: Therapist, occupational: 3: Chair or W/C > Bed: Mod A (lift or lower assist) (requires staff to lift lft leg into bed)  FIM - Locomotion: Wheelchair Distance: 150 Locomotion: Wheelchair: 1: Total Assistance/staff pushes wheelchair (Pt<25%) FIM - Locomotion: Ambulation Locomotion: Ambulation Assistive Devices: Designer, industrial/product Ambulation/Gait Assistance: 5: Supervision Locomotion: Ambulation: 5: Travels 150 ft or more with supervision/safety issues  Comprehension Comprehension Mode: Auditory Comprehension: 5-Follows basic conversation/direction: With no assist  Expression Expression Mode: Verbal Expression: 5-Expresses basic needs/ideas: With extra time/assistive device  Social Interaction Social Interaction Mode: Asleep Social Interaction: 6-Interacts appropriately with others with medication or extra time (anti-anxiety, antidepressant).  Problem Solving Problem Solving Mode: Asleep Problem Solving: 5-Solves basic problems: With no assist  Memory Memory Mode: Asleep Memory Assistive Devices: Memory book Memory: 4-Recognizes or recalls 75 - 89% of the time/requires cueing 10 - 24% of the time  Medical Problem List and Plan:  1. Deconditioning related to CHF weight reduce from 127kg- 125.8kg and multi-medical, toxic encephalopathy improving  2. DVT Prophylaxis/Anticoagulation: Subcutaneous heparin. Monitor platelet counts any signs of bleeding  3. Pain Management: Neurontin 300 mg daily, Percocet as needed. Monitor mental status  4. Mood/delirium/depression. Prozac 20  mg daily. Provide emotional support and positive  reinforcement  5. Neuropsych: This patient is capable of making decisions on his/her own behalf.  6. Hypertension/atrial fibrillation. Coreg 6.25 mg twice a day, Aldactone 25 mg daily, Demadex 40 mg daily. Monitor with increased mobility. No plans for chronic anti-coagulation secondary to fall risk and history of alcohol use  7. Pseudomonas UTI. Completed Cipro 8. Subcutaneous masses on lower back and lower tremor the felt to be chronic in nature. We'll continue to provide conservative care  9. Question cellulitis lower extremities.Off  Vancomycin 12/6 Monitor skin closely  10. Diabetes mellitus. Hemoglobin A1c of 5.8. Lantus insulin 10 units daily. Check blood sugars a.c. and at bedtime  11. Incidental finding of L1 transverse process fracture acute versus subacute. Conservative care advised as per neurosurgery Dr. Phoebe Perch  12. Hypothyroidism. Synthroid  13. Hyperlipidemia. Zocor  14. Chronic renal insufficiency. Creatinine baseline 1.27-1.58. Followup chemistries ,BUN and creatinine are trending up likely due to diuretics. Defer to cardiology on this 15. COPD. Check oxygen saturations every shift    LOS (Days) 11 A FACE TO FACE EVALUATION WAS PERFORMED  Dabney Dever E 09/19/2012, 4:45 PM

## 2012-09-19 NOTE — Progress Notes (Signed)
Physical Therapy Note  Patient Details  Name: [name] MRN: [id number] Date of Birth: Dec 14, 1945 Today's Date: 09/19/2012 8:00 - 8:55 55 minutes Individual session Patient reports pain in left leg/hip area at an 8. Nursing notified and medication given.  Treatment focused on gaining independence with ambulation without oxygen. Patient ambulated 95 feet, 100 feet, and 75 feet with rolling walker on level tile with close supervision. Patient requires minimal assistance for sit to stand. Patient worked on Careers information officer in hallway 15 feet to left and right x 2 with minimal contact assist. Oxygen sat rates dropped as low as 88 but came back to the 90's with a couple of deep breaths. Most of the time, the O2 sat was in the low 90's following ambulation.   Arelia Longest M 09/19/2012, 10:05 AM

## 2012-09-19 NOTE — Progress Notes (Signed)
Physical Therapy Note  Patient Details  Name: [name] MRN: [id number] Date of Birth: [birth year] Today's Date: 09/19/2012  Treatment focused on family education with daughter, [name]. Patient transferred into and out of simulated car with minimal assistance for left LE. Patient ambulated up and down 5 steps using 1 rail with minimal assistance. Patient ambulated 180 feet with rolling walker and supervision. Patient was able to move from sitting to standing from wheelchair x 3 with supervision during the session.  Daughter said her father was doing much better than when he came into the hospital and she felt comfortable being able to assist him at discharge. She had no further questions.   Arelia Longest M 09/19/2012, 12:10 PM

## 2012-09-19 NOTE — Progress Notes (Signed)
Occupational Therapy Session Note  Patient Details  Name: [name] MRN: [id number] Date of Birth: [birth year]  Today's Date: 09/19/2012 Time: 4098-1191 Time Calculation (min): 42 min  Short Term Goals: Week 2:  OT Short Term Goal 1 (Week 2): Pt will complete a tub bench or a shower stall transfer with min assist. OT Short Term Goal 1 - Progress (Week 2): Progressing toward goal OT Short Term Goal 2 (Week 2): Pt will don pants with min assist. OT Short Term Goal 2 - Progress (Week 2): Progressing toward goal OT Short Term Goal 3 (Week 2): Pt will toilet with min assist (needing assist for cleanup only).  Skilled Therapeutic Interventions/Progress Updates:  Patient sleeping in bed upon arrival.  Engaged in bed mobility, UE exercises EOB and sitting edge of the recliner, ambulate with RW to bathroom, toilet transfers, ambulate to recliner.  Requesting bed rail, raise HOB and raise bed higher off floor then patient agreed to attempt OOB with only the use of the rail and was supervision.  Once patient arrived into the bathroom, he placed RW aside and used the door facing, walls and grab bars for stability with supervision to simulate the context of his home bathroom because the walker does not fit in the bathroom.  Required assistance for cleanup.  Focus session on UB and core strengthening, activity tolerance and safe transfers.  Therapy Documentation Precautions:  Precautions Precautions: Fall Pain: Pain Assessment Pain Assessment: No/denies pain ADL: See FIM for current functional status  Therapy/Group: Individual Therapy  Michaeljohn Biss 09/19/2012, 4:02 PM

## 2012-09-19 NOTE — Progress Notes (Signed)
Social Work Patient ID: [name], male   DOB: 06-25-1946, 66 y.o.   MRN: [id number] Met with pt and daughter who was here for family education.  Both report it went well and pt is much better than on admit. He is ready to go home Thursday.  Discussed DME and follow up therapies.  Pt wants to use home health agency he had before, he thinks it was [facility]. Will contact and see if followed in March.  Continue to work on discharge for Thursday.

## 2012-09-20 ENCOUNTER — Inpatient Hospital Stay (HOSPITAL_COMMUNITY): Payer: Medicare Other | Admitting: Occupational Therapy

## 2012-09-20 ENCOUNTER — Inpatient Hospital Stay (HOSPITAL_COMMUNITY): Payer: Medicare Other

## 2012-09-20 LAB — GLUCOSE, CAPILLARY
Glucose-Capillary: 106 mg/dL — ABNORMAL HIGH (ref 70–99)
Glucose-Capillary: 116 mg/dL — ABNORMAL HIGH (ref 70–99)

## 2012-09-20 LAB — BASIC METABOLIC PANEL
BUN: 72 mg/dL — ABNORMAL HIGH (ref 6–23)
Creatinine, Ser: 1.72 mg/dL — ABNORMAL HIGH (ref 0.50–1.35)
GFR calc Af Amer: 45 mL/min — ABNORMAL LOW (ref >90)
GFR calc non Af Amer: 39 mL/min — ABNORMAL LOW (ref >90)
Glucose, Bld: 112 mg/dL — ABNORMAL HIGH (ref 70–99)
Potassium: 4.8 mEq/L (ref 3.5–5.1)

## 2012-09-20 MED ORDER — FUROSEMIDE 10 MG/ML IJ SOLN
80.0000 mg | Freq: Once | INTRAMUSCULAR | Status: AC
  Administered 2012-09-20: 80 mg via INTRAVENOUS
  Filled 2012-09-20 (×2): qty 8

## 2012-09-20 NOTE — Patient Care Conference (Signed)
Inpatient RehabilitationTeam Conference Note Date: 09/20/2012   Time: 11:25 AM    Patient Name: [name]      Medical Record Number: [id number]  Date of Birth: 1946/08/05 Sex: Male         Room/Bed: 4144/4144-01 Payor Info: Payor: MEDICARE  Plan: MEDICARE PART A AND B  Product Type: *No Product type*     Admitting Diagnosis: CHF,DECOND  Admit Date/Time:  09/08/2012  3:43 PM Admission Comments: No comment available   Primary Diagnosis:  Physical deconditioning Principal Problem: Physical deconditioning  Patient Active Problem List   Diagnosis Date Noted  . Physical deconditioning 09/11/2012  . L1 vertebral fracture 09/07/2012  . Hypokalemia 09/06/2012  . Pseudomonas urinary tract infection 09/05/2012  . Acute renal failure 08/30/2012  . Weakness generalized 08/30/2012  . Hypothyroidism 08/30/2012  . Acute respiratory failure with hypoxia 08/30/2012  . Elevated troponin 08/30/2012  . Elevated LFTs 08/30/2012  . Acute on chronic diastolic CHF (congestive heart failure) 08/30/2012  . Hematoma-on back, sacral area, right chest wall post fall 08/28/2012  . Renal insufficiency 10/06/2011  . NSTEMI (non-ST elevated myocardial infarction) 10/06/2011  . ETOH abuse   . Atrial fibrillation   . Dyspnea 09/14/2011  . DIASTOLIC HEART FAILURE, CHRONIC 12/23/2009  . EDEMA 06/24/2009  . DM 06/20/2009  . HYPERLIPIDEMIA 06/20/2009  . OBSTRUCTIVE SLEEP APNEA 06/20/2009  . HYPERTENSION 06/20/2009  . CAROTID STENOSIS 06/20/2009  . CHRONIC OBSTRUCTIVE PULMONARY DISEASE 06/20/2009  . CAD 06/20/2009    Expected Discharge Date: Expected Discharge Date: 09/21/12  Team Members Present: Physician: Dr. Claudette Laws Social Worker Present: Dossie Der, LCSW Nurse Present: Chana Bode, RN PT Present: Edman Circle, PT;Other (comment);Wanda Plump, PT (Bridget Ripa-PT) OT Present: Bretta Bang, Verlene Mayer, OT SLP Present: Fae Pippin, SLP Other (Discipline and Name): Charolette Child Coordinator     Current Status/Progress Goal Weekly Team Focus  Medical   poor endurance, oxygenation improves         Bowel/Bladder   pt continent of bowel and bladder needs assistance to place and hold urinal.  LBM-09/19/12  will remain continent of bowel and bladder. Will require min assist with urinal staff will just need to empty.   will continue to be continent of bowel and bladder. time toileting q 2hrs.   Swallow/Nutrition/ Hydration             ADL's   mod assist toileting, min assist LB dressing and bathing, supervision with toilet transfers with RW. Excellent progress this week. Family education completed with daughter.  Mod assist toileting, min assist bathing, LB dressing, shower stall transfers, supervision toilet  OT LTGs have just been met, but will focus on ADL retraining to ensure consistency, pt education with HEP   Mobility   min assist sit to stand; supervision ambulation up to 100 to 150 feet  supervision to min assist  complete education for discharge this week   Communication             Safety/Cognition/ Behavioral Observations            Pain   pt c/o back pain. Has prn percocet one to two tabs of 5-325mg  po q 6hrs and tylenol 625mg  po q 4 hours prn both given on night shift. scheduled neurontin 300mg  po qd  pain will remain at a 3 or less   Continue to assess and monitor pain medicate as needed.   Skin   Scabbed areas noted on Bilateral knees and lower extremities. Allevyn to  right knee CDI no drainage noted. BLE discolored with +2 edema and cellulitis present. Hematoma to right lower back.   pt will have no new skin breakdown or infection noted.  will continue to assess and monitor pt skin for breakdown or s/sx of infection q shift.       *See Interdisciplinary Assessment and Plan and progress notes for long and short-term goals  Barriers to Discharge:    None  Possible Resolutions to Barriers:    NOne   Discharge Planning/Teaching Needs:   Family education completed yesterday and daughter can provide 24 hr supervision at discharge      Team Discussion:  Reached goals and family education completed ready for discharge.  Off O2 now.  [place] every 2hr for cont  Revisions to Treatment Plan:  Discharge date changed to 12/12      DupreeLemar Livings 09/21/2012, 8:38 AM

## 2012-09-20 NOTE — Progress Notes (Signed)
Physical Therapy Discharge Summary  Patient Details  Name: [name] MRN: [id number] Date of Birth: 09/09/1946  Today's Date: 09/21/2012 Time: 1030-1100 Time Calculation (min): 30 min  Patient has met 7 of 8 long term goals due to improved activity tolerance, improved balance, increased strength, ability to compensate for deficits and functional use of  left lower extremity.  Patient to discharge at an ambulatory level Supervision; min assist on stairs for safety.   Patient's care partner is independent to provide the necessary physical assistance at discharge.  Reasons goals not met: morbid obesity and generalized weakness limited progress with bed mobility  Recommendation:  Patient will benefit from ongoing skilled PT services in home health setting to continue to advance safe functional mobility, address ongoing impairments in strength, activity tolerance, balance, and minimize fall risk.  Equipment: wide rolling walker; basic w/c with cushion  Reasons for discharge: treatment goals met and discharge from hospital  Patient/family agrees with progress made and goals achieved: Yes  PT Discharge   Pain Pain Assessment Pain Score:   5     Cognition-A and O x 4   Sensation Sensation Light Touch Impaired Details: Absent RLE;Absent LLE Stereognosis: Impaired by gross assessment Hot/Cold: Appears Intact Proprioception Impaired Details: Absent RLE;Absent LLE Coordination Gross Motor Movements are Fluid and Coordinated: No Fine Motor Movements are Fluid and Coordinated: No Motor  Motor Motor: Within Functional Limits Motor - Discharge Observations: generalized weakness and limited use of LUE due to hand contracture bil hip extensors weak due to sciatica? Mobility Bed Mobility Supine to Sit: 4: Min assist (depending upon ht of surface) Sit to Supine: 4: Min assist Transfers Sit to Stand: 4: Min assist Sit to Stand Details: Manual facilitation for weight  shifting Stand to Sit: 5: Supervision Stand to Sit Details (indicate cue type and reason): Verbal cues for technique Stand Pivot Transfers: 5: Supervision Stand Pivot Transfer Details (indicate cue type and reason): RW Locomotion  Ambulation Ambulation: Yes Ambulation/Gait Assistance: 5: Supervision Ambulation Distance (Feet): 70 Feet Assistive device: Rolling walker (wide RW) Ambulation/Gait Assistance Details: Verbal cues for technique Gait Gait: Yes Gait Pattern: Impaired Gait Pattern: Decreased hip/knee flexion - right;Decreased hip/knee flexion - left;Decreased dorsiflexion - left;Narrow base of support;Trunk flexed;Left steppage;Step-through pattern (bil hips ER) Gait velocity: decreased High Level Ambulation High Level Ambulation: Side stepping Stairs / Additional Locomotion Stairs: Yes Stairs Assistance: 4: Min assist Stairs Assistance Details: Verbal cues for technique Stair Management Technique: Sideways Number of Stairs: 5  Height of Stairs: 7  Wheelchair Mobility Wheelchair Mobility: Yes Wheelchair Assistance: 5: Financial planner Details: Verbal cues for Diplomatic Services operational officer: Both upper extremities Wheelchair Parts Management: Needs assistance Distance: 155  Trunk/Postural Assessment  Cervical Assessment Cervical Assessment: Within Functional Limits Thoracic Assessment Thoracic Assessment: Within Functional Limits Lumbar Assessment Lumbar Assessment: Within Functional Limits Postural Control Postural Control: Within Functional Limits  Balance Static Standing Balance Static Standing - Balance Support: Bilateral upper extremity supported Static Standing - Level of Assistance: 5: Stand by assistance Extremity Assessment   RLE grossly 4+/5 hip flexion and knee extension, 3+/5 ankle DF LLE grossly 3-/5 hip flexion, 4+/5 knee extension, 1/5 ankle DF         See FIM for current functional status  Treatment today:  Daughter not  present for education about [employer]; therapist discussed them with her by phone, and issued a hand-out to pt. Daughter previously attended PT for mobility training, which was completed.  Pt stated he needed to use toilet  for BM.  Supine > sit using rail, HOB elevated, supervision.  Sit> stand to RW with supervision from elevated bed.  Gait in home setting x 30' with supervision.  Toilet transfer with supervision, using rails.  Pt urinated, but no BM.  Fitted rental w/c to pt.  Pt propelled 50' in home setting, around furniture, then 155' using bil UEs.  Pt stated this w/c is easier to propel.    Instructed pt in more efficient stroke/glide technique for propulsion.    Raliegh Scobie 09/21/2012, 3:20 PM

## 2012-09-20 NOTE — Progress Notes (Signed)
Occupational Therapy Session Note  Patient Details  Name: [name] MRN: [id number] Date of Birth: 1946-03-26  Today's Date: 09/20/2012 Time: 0900-1000 and 1030-1110 Time Calculation (min): 60 min and 40 min  Short Term Goals: Week 1:  OT Short Term Goal 1 (Week 1): Pt will be able to ambulate to the toilet with supervision. OT Short Term Goal 1 - Progress (Week 1): Met OT Short Term Goal 2 (Week 1): Pt will be able to sit to stand to pull up pants with supervision. OT Short Term Goal 2 - Progress (Week 1): Met OT Short Term Goal 3 (Week 1): Pt will be able to use a long sponge to bathe with min assist. OT Short Term Goal 3 - Progress (Week 1): Met OT Short Term Goal 4 (Week 1): Pt will be able to don pants with mod assist. OT Short Term Goal 4 - Progress (Week 1): Met Week 2:  OT Short Term Goal 1 (Week 2): Pt will complete a tub bench or a shower stall transfer with min assist. OT Short Term Goal 1 - Progress (Week 2): Progressing toward goal OT Short Term Goal 2 (Week 2): Pt will don pants with min assist. OT Short Term Goal 2 - Progress (Week 2): Progressing toward goal OT Short Term Goal 3 (Week 2): Pt will toilet with min assist (needing assist for cleanup only).  Skilled Therapeutic Interventions/Progress Updates:    Visit 1:   Pt seen for BADL retraining of toileting, bathing, and dressing with a focus on activity tolerance and standing balance. Pt used walk in shower and long handled sponge.  Pt was able to bathe with min assist, but needs to complete perineal bathing from supine. Pt declined donning clothing. Used hospital gowns.  Visit 2:  Pt worked on toileting skills and tub bench transfers into tub. He was able to get in and out of tub with min assist and pt would like to use one at home.  At patients home he has a small walk in shower in which a shower chair will not fit and a tub with glass doors. Pt previously stood in shower.  HHOT recommended to assess the most  appropriate bathing situation.  Therapy Documentation Precautions:  Precautions Precautions: Fall Precaution Comments: weaning off O2 Restrictions Weight Bearing Restrictions: No  Pain: No complaints of pain in either session.  ADL:  See FIM for current functional status  Therapy/Group: Individual Therapy  SAGUIER,JULIA 09/20/2012, 11:38 AM

## 2012-09-20 NOTE — Progress Notes (Signed)
09/20/12- Nursing note- pt c/o itching all over medicated with benadryl topical cream will reassess.

## 2012-09-20 NOTE — Progress Notes (Signed)
Advanced Heart Failure Rounding Note  Subjective:  Mr. Kinser is a 66 yo male with PMX s/f CAD (s/p CABG x 4 in 2001, normal Myoview 2010), chronic diastolic CHF, persistent a-fib, DM, HTN, HL, carotid artery disease, COPD, hypothyroidism, EtOH abuse, chronic venous insufficiency and morbid obesity.  He is not anticoagulants due to ETOH abuse and fall risk.   Echo 09/2011: LVEF 45-50%, mod biatral enlargement, moderate RV dilatation, moderate TR, PASP 66 mmHg.  ECHO 08/2012 EF 55-60% moderate bilateral RA/LA enlargement  Admitted 08/30/12 for acute on chronic diastolic CHF. He has been diuresed with IV lasix 40 mg bid.   Admit weight 278 but trending up to 282 pounds. After reviewing office note weights from the last 6 months his weight was 240-250 pounds.   Lasix drip stopped 08/2812. 09/07/12 CT showed multiple resolving SQ hematomas. D/C from acute care 09/08/12 to acute rehab weight at that time was 264 pounds. Switched to Toresemide 40 mg daily then titrated to 40 bid.  Weight down 267 pounds.  Denies SOB/PND/Orthopnea.  Walking with walker.   Intake/Output Summary (Last 24 hours) at 09/20/12 1722 Last data filed at 09/20/12 1400  Gross per 24 hour  Intake    960 ml  Output   1000 ml  Net    -40 ml    Current meds:    . aspirin  325 mg Oral Daily  . carvedilol  6.25 mg Oral BID WC  . FLUoxetine  20 mg Oral Daily  . folic acid  1 mg Oral Daily  . gabapentin  300 mg Oral Daily  . heparin subcutaneous  5,000 Units Subcutaneous Q8H  . insulin aspart  0-9 Units Subcutaneous TID WC  . insulin glargine  10 Units Subcutaneous QHS  . lactulose  30 g Oral Daily  . levothyroxine  150 mcg Oral QAC breakfast  . multivitamin with minerals  1 tablet Oral Daily  . neomycin-bacitracin-polymyxin   Topical BID  . potassium chloride  40 mEq Oral BID  . simvastatin  40 mg Oral QHS  . spironolactone  25 mg Oral Daily  . torsemide  80 mg Oral Daily   Infusions:     Objective:  Blood  pressure 123/78, pulse 55, temperature 97.6 F (36.4 C), temperature source Oral, resp. rate 18, weight 121.5 kg (267 lb 13.7 oz), SpO2 100.00%. Weight change: -4.8 kg (-10 lb 9.3 oz)  Physical Exam: General:  Chronically ill. No resp difficulty HEENT: normal Neck: supple. JVP difficult to assess. Carotids 2+ bilat; no bruits. No lymphadenopathy or thryomegaly appreciated. Cor: PMI nondisplaced. Regular rate & rhythm. No rubs, gallops. 1/6 SEM RUSB. Lungs: Crackles at bases bilaterally on 2 liters [place] Abdomen: obese soft, nontender, nondistended. No hepatosplenomegaly. No bruits or masses. Good bowel sounds. Large irregular subQ masses on back Extremities: no cyanosis, clubbing, rash,  1+ edema . Neuro: alert & orientedx3, cranial nerves grossly intact. moves all 4 extremities w/o difficulty. Affect pleasant   Basic Metabolic Panel:  Lab 09/20/12 1610 09/19/12 0500 09/18/12 0535 09/17/12 0505 09/16/12 0500  NA 134* 132* 134* 137 135  K 4.8 4.9 -- -- --  CL 95* 94* 94* 97 95*  CO2 28 29 31  33* 29  GLUCOSE 112* 120* 96 113* 128*  BUN 72* 73* 67* 61* 61*  CREATININE 1.72* 1.61* 1.53* 1.36* 1.19  CALCIUM 9.8 9.5 9.5 9.7 9.4  MG -- -- -- -- --  PHOS -- -- -- -- --   Liver Function Tests: No results  found for this basename: AST:5,ALT:5,ALKPHOS:5,BILITOT:5,PROT:5,ALBUMIN:5 in the last 168 hours No results found for this basename: LIPASE:5,AMYLASE:5 in the last 168 hours No results found for this basename: AMMONIA:5 in the last 168 hours CBC: No results found for this basename: WBC:5,NEUTROABS:5,HGB:5,HCT:5,MCV:5,PLT:5 in the last 168 hours Cardiac Enzymes: No results found for this basename: CKTOTAL:5,CKMB:5,CKMBINDEX:5,TROPONINI:5 in the last 168 hours BNP: No components found with this basename: POCBNP:5 CBG:  Lab 09/20/12 1644 09/20/12 1132 09/20/12 0713 09/19/12 2043 09/19/12 1625  GLUCAP 116* 120* 106* 169* 114*   Microbiology: Lab Results  Component Value Date   CULT  PSEUDOMONAS AERUGINOSA 09/04/2012   CULT NO GROWTH 10/05/2011   CULT NO GROWTH 5 DAYS 10/05/2011   CULT NO GROWTH 5 DAYS 10/05/2011   CULT NO GROWTH 5 DAYS 06/25/2010   No results found for this basename: CULT:2,SDES:2 in the last 168 hours  Imaging: No results found.   ASSESSMENT:   1. A/C diastolic heart failure EF 55-60% RV moderately dilated/HK with RV-RA gradient 56 mmHg.  2. Right heart failure 3. A Fib 4. Hypothyroidism 5. Obesity 6. CAD 7. Hematoma 8. Acute renal failure (baseline creatinine 0.8-1.1): suspect cardiorenal  9. Hypokalemia 10. SubQ masses on lower back = hematomas (resolving) 11. LE cellulitis  PLAN/DISCUSSION  Weight appears down today. Hard to assess volume on exam. Seems ok but may still have some fluid on board. Will continue po demadex for now. Give one dose of IV lasix now. Check BNP on discharge. Will follow closely in HF clinic  Discharge from rehab planned for 09/21/12.  F/u HF clinic 12/19 at 845.  Keven Soucy,MD 5:25 PM

## 2012-09-20 NOTE — Progress Notes (Signed)
Social Work Patient ID: [name], male   DOB: 09-18-1946, 66 y.o.   MRN: [id number] Met with pt and spoke with daughter-[name] via telephone to inform team conference progression toward goals and change discharge date to tomorrow. He is happy and daughter reports she can be here at 73 tomorrow.  Family education completed and will get DME delivered for tomorrow.  Inform team.

## 2012-09-20 NOTE — Progress Notes (Signed)
Patient ID: [name], male   DOB: 11/15/1945, 66 y.o.   MRN: [id number]  Subjective/Complaints: male with history of COPD, atrial fibrillation but not a candidate for Coumadin secondary to history alcohol use, CAD with coronary artery bypass grafting 2001, left lower extremity chronic hematoma and chronic diastolic CHF. Admitted 08/30/2012 with altered mental status and oxygen saturations 85% on room air. Findings of elevated creatinine 1.93. Cardiac enzymes were unremarkable. Chest x-ray completed showing moderate pulmonary vascular congestion suspect CHF. Echocardiogram with ejection fraction of 60% and moderate RV dilatation, moderate TR. Patient placed on intravenous Lasix with followup cardiology services monitored closely for volume overload. His volume status greatly improved and Lasix was changed to Demadex. Subcutaneous heparin added for DVT prophylaxis. Hospital course Pseudomonas UTI maintained on Cipro. Patient with multiple soft tissue masses on his back bilaterally primarily along the left flank hematoma and left leg that was chronic in nature. Noncontrast CT of abdomen and pelvis showed interval decrease in size of previously described right sided presumed subcutaneous hematomas. Noted increasing cellulitic changes lower tremor to his questionable cellulitis intravenous vancomycin was initiated 09/08/2012 x7 days. There was incidental finding of new acute/subacute L1 transverse process fracture which was discussed with neurosurgery Dr. Phoebe Perch and advise conservative care and did not need outpatient followup with neurosurgery.    No longer wearing O2. Oxygen saturations are good. Occasionally desaturates with activity but this response to deep breathing  Objective: Vital Signs: Blood pressure 122/73, pulse 67, temperature 97.7 F (36.5 C), temperature source Oral, resp. rate 17, weight 121.5 kg (267 lb 13.7 oz), SpO2 96.00%. No results found. Results for orders placed during the  hospital encounter of 09/08/12 (from the past 72 hour(s))  GLUCOSE, CAPILLARY     Status: Abnormal   Collection Time   09/17/12 12:02 PM      Component Value Range Comment   Glucose-Capillary 123 (*) 70 - 99 mg/dL    Comment 1 Notify RN     GLUCOSE, CAPILLARY     Status: Abnormal   Collection Time   09/17/12  4:43 PM      Component Value Range Comment   Glucose-Capillary 136 (*) 70 - 99 mg/dL    Comment 1 Notify RN     GLUCOSE, CAPILLARY     Status: Abnormal   Collection Time   09/17/12  8:13 PM      Component Value Range Comment   Glucose-Capillary 157 (*) 70 - 99 mg/dL    Comment 1 Notify RN     BASIC METABOLIC PANEL     Status: Abnormal   Collection Time   09/18/12  5:35 AM      Component Value Range Comment   Sodium 134 (*) 135 - 145 mEq/L    Potassium 4.8  3.5 - 5.1 mEq/L    Chloride 94 (*) 96 - 112 mEq/L    CO2 31  19 - 32 mEq/L    Glucose, Bld 96  70 - 99 mg/dL    BUN 67 (*) 6 - 23 mg/dL    Creatinine, Ser 4.09 (*) 0.50 - 1.35 mg/dL    Calcium 9.5  8.4 - 81.1 mg/dL    GFR calc non Af Amer 45 (*) >90 mL/min    GFR calc Af Amer 52 (*) >90 mL/min   GLUCOSE, CAPILLARY     Status: Abnormal   Collection Time   09/18/12  7:27 AM      Component Value Range Comment   Glucose-Capillary 105 (*)  70 - 99 mg/dL    Comment 1 Notify RN     GLUCOSE, CAPILLARY     Status: Abnormal   Collection Time   09/18/12 11:29 AM      Component Value Range Comment   Glucose-Capillary 125 (*) 70 - 99 mg/dL    Comment 1 Notify RN     GLUCOSE, CAPILLARY     Status: Abnormal   Collection Time   09/18/12  4:03 PM      Component Value Range Comment   Glucose-Capillary 126 (*) 70 - 99 mg/dL   GLUCOSE, CAPILLARY     Status: Abnormal   Collection Time   09/18/12  8:23 PM      Component Value Range Comment   Glucose-Capillary 154 (*) 70 - 99 mg/dL    Comment 1 Notify RN     BASIC METABOLIC PANEL     Status: Abnormal   Collection Time   09/19/12  5:00 AM      Component Value Range Comment   Sodium  132 (*) 135 - 145 mEq/L    Potassium 4.9  3.5 - 5.1 mEq/L    Chloride 94 (*) 96 - 112 mEq/L    CO2 29  19 - 32 mEq/L    Glucose, Bld 120 (*) 70 - 99 mg/dL    BUN 73 (*) 6 - 23 mg/dL    Creatinine, Ser 1.61 (*) 0.50 - 1.35 mg/dL    Calcium 9.5  8.4 - 09.6 mg/dL    GFR calc non Af Amer 42 (*) >90 mL/min    GFR calc Af Amer 49 (*) >90 mL/min   GLUCOSE, CAPILLARY     Status: Abnormal   Collection Time   09/19/12  7:37 AM      Component Value Range Comment   Glucose-Capillary 121 (*) 70 - 99 mg/dL    Comment 1 Notify RN     GLUCOSE, CAPILLARY     Status: Abnormal   Collection Time   09/19/12 11:22 AM      Component Value Range Comment   Glucose-Capillary 119 (*) 70 - 99 mg/dL    Comment 1 Notify RN     GLUCOSE, CAPILLARY     Status: Abnormal   Collection Time   09/19/12  4:25 PM      Component Value Range Comment   Glucose-Capillary 114 (*) 70 - 99 mg/dL   URINALYSIS, ROUTINE W REFLEX MICROSCOPIC     Status: Normal   Collection Time   09/19/12  6:45 PM      Component Value Range Comment   Color, Urine YELLOW  YELLOW    APPearance CLEAR  CLEAR    Specific Gravity, Urine 1.009  1.005 - 1.030    pH 7.5  5.0 - 8.0    Glucose, UA NEGATIVE  NEGATIVE mg/dL    Hgb urine dipstick NEGATIVE  NEGATIVE    Bilirubin Urine NEGATIVE  NEGATIVE    Ketones, ur NEGATIVE  NEGATIVE mg/dL    Protein, ur NEGATIVE  NEGATIVE mg/dL    Urobilinogen, UA 1.0  0.0 - 1.0 mg/dL    Nitrite NEGATIVE  NEGATIVE    Leukocytes, UA NEGATIVE  NEGATIVE MICROSCOPIC NOT DONE ON URINES WITH NEGATIVE PROTEIN, BLOOD, LEUKOCYTES, NITRITE, OR GLUCOSE <1000 mg/dL.  GLUCOSE, CAPILLARY     Status: Abnormal   Collection Time   09/19/12  8:43 PM      Component Value Range Comment   Glucose-Capillary 169 (*) 70 - 99 mg/dL  Comment 1 Notify RN     BASIC METABOLIC PANEL     Status: Abnormal   Collection Time   09/20/12  5:00 AM      Component Value Range Comment   Sodium 134 (*) 135 - 145 mEq/L    Potassium 4.8  3.5 - 5.1  mEq/L    Chloride 95 (*) 96 - 112 mEq/L    CO2 28  19 - 32 mEq/L    Glucose, Bld 112 (*) 70 - 99 mg/dL    BUN 72 (*) 6 - 23 mg/dL    Creatinine, Ser 9.60 (*) 0.50 - 1.35 mg/dL    Calcium 9.8  8.4 - 45.4 mg/dL    GFR calc non Af Amer 39 (*) >90 mL/min    GFR calc Af Amer 45 (*) >90 mL/min   GLUCOSE, CAPILLARY     Status: Abnormal   Collection Time   09/20/12  7:13 AM      Component Value Range Comment   Glucose-Capillary 106 (*) 70 - 99 mg/dL    Comment 1 Notify RN        HEENT: normal, nares without breakdown Cardio: irregular Resp: CTA B/L GI: BS positive Extremity:  Edema bilateral pretibial 2+ Skin:   Other stasis dermatitis bilateral pretib, no erythema Neuro: Alert/Oriented, Abnormal Sensory diminished LT in feet, Abnormal Motor 4/5 in BUEs and RLE, 3-/5 in LLE and Other Hearing impaired Musc/Skel:  No pain with AROM of all 4 ext Gen NAD   Assessment/Plan: 1. Functional deficits secondary to Decondtioning acute on chronic diastolic CHF which require 3+ hours per day of interdisciplinary therapy in a comprehensive inpatient rehab setting. Physiatrist is providing close team supervision and 24 hour management of active medical problems listed below. Physiatrist and rehab team continue to assess barriers to discharge/monitor patient progress toward functional and medical goals. FIM: FIM - Bathing Bathing Steps Patient Completed: Chest;Right Arm;Left Arm;Abdomen;Right upper leg;Left upper leg;Right lower leg (including foot);Left lower leg (including foot) (with long sponge) Bathing:  (refused bath)  FIM - Upper Body Dressing/Undressing Upper body dressing/undressing steps patient completed: Thread/unthread right sleeve of pullover shirt/dresss;Thread/unthread left sleeve of pullover shirt/dress;Put head through opening of pull over shirt/dress;Pull shirt over trunk Upper body dressing/undressing: 5: Set-up assist to: Obtain clothing/put away FIM - Lower Body  Dressing/Undressing Lower body dressing/undressing steps patient completed: Thread/unthread right underwear leg;Thread/unthread left underwear leg;Pull underwear up/down;Thread/unthread right pants leg;Thread/unthread left pants leg;Pull pants up/down Lower body dressing/undressing: 4: Min-Patient completed 75 plus % of tasks  FIM - Toileting Toileting steps completed by patient: Adjust clothing prior to toileting;Adjust clothing after toileting Toileting Assistive Devices: Grab bar or rail for support Toileting: 3: Mod-Patient completed 2 of 3 steps  FIM - Diplomatic Services operational officer Devices: Elevated toilet seat;Grab bars;Walker Toilet Transfers: 5-To toilet/BSC: Supervision (verbal cues/safety issues);5-From toilet/BSC: Supervision (verbal cues/safety issues)  FIM - Banker Devices: Therapist, occupational: 3: Chair or W/C > Bed: Mod A (lift or lower assist) (requires staff to lift lft leg into bed)  FIM - Locomotion: Wheelchair Distance: 150 Locomotion: Wheelchair: 1: Total Assistance/staff pushes wheelchair (Pt<25%) FIM - Locomotion: Ambulation Locomotion: Ambulation Assistive Devices: Designer, industrial/product Ambulation/Gait Assistance: 5: Supervision Locomotion: Ambulation: 5: Travels 150 ft or more with supervision/safety issues  Comprehension Comprehension Mode: Auditory Comprehension: 5-Follows basic conversation/direction: With no assist  Expression Expression Mode: Verbal Expression: 5-Expresses basic needs/ideas: With extra time/assistive device  Social Interaction Social Interaction Mode: Asleep Social Interaction: 6-Interacts appropriately  with others with medication or extra time (anti-anxiety, antidepressant).  Problem Solving Problem Solving Mode: Asleep Problem Solving: 5-Solves basic problems: With no assist  Memory Memory Mode: Asleep Memory Assistive Devices: Memory book Memory: 4-Recognizes or recalls  75 - 89% of the time/requires cueing 10 - 24% of the time  Medical Problem List and Plan:  1. Deconditioning related to CHF weight reduce from 127kg- 125.8kg and multi-medical, toxic encephalopathy improving  2. DVT Prophylaxis/Anticoagulation: Subcutaneous heparin. Monitor platelet counts any signs of bleeding  3. Pain Management: Neurontin 300 mg daily, Percocet as needed. Monitor mental status  4. Mood/delirium/depression. Prozac 20 mg daily. Provide emotional support and positive reinforcement  5. Neuropsych: This patient is capable of making decisions on his/her own behalf.  6. Hypertension/atrial fibrillation. Coreg 6.25 mg twice a day, Aldactone 25 mg daily, Demadex 40 mg daily. Monitor with increased mobility. No plans for chronic anti-coagulation secondary to fall risk and history of alcohol use  7. Pseudomonas UTI. Completed Cipro 8. Subcutaneous masses on lower back and lower tremor the felt to be chronic in nature. We'll continue to provide conservative care  9. Question cellulitis lower extremities.Off  Vancomycin 12/6 Monitor skin closely  10. Diabetes mellitus. Hemoglobin A1c of 5.8. Lantus insulin 10 units daily. Check blood sugars a.c. and at bedtime  11. Incidental finding of L1 transverse process fracture acute versus subacute. Conservative care advised as per neurosurgery Dr. Phoebe Perch  12. Hypothyroidism. Synthroid  13. Hyperlipidemia. Zocor  14. Chronic renal insufficiency. Creatinine baseline 1.27-1.58. Followup chemistries ,BUN and creatinine are trending up likely due to diuretics. Defer to cardiology on this 15. COPD. Check oxygen saturations every shift, staying in 90s off O2    LOS (Days) 12 A FACE TO FACE EVALUATION WAS PERFORMED  Jashawn Floyd E 09/20/2012, 9:35 AM

## 2012-09-20 NOTE — Progress Notes (Signed)
Physical Therapy Session Note  Patient Details  Name: [name] MRN: [id number] Date of Birth: [birth year]  Today's Date: 09/20/2012 Time: 0804-0900 and 1230-1415 Time Calculation (min): 56 min and 45 min  Short Term Goals: Week 2: LTGs     Skilled Therapeutic Interventions/Progress Updates:  AM-  Pt stated he needed to use toilet to have BM. Toilet transfer with supervision, wall bars. ; no results.   Gait in home setting with RW x 15'.  Gait x 98' with RW, supervision.  Pt c/o abdominal pain; consulted with RN; he had laxatives this AM.  Therapeutic exercise performed with LE to increase strength for functional mobility: in standing with bil UE support on wall rail- sidestepping L<> R focusing on neutral hip ; mini squats. Kinetron in sitting in w/c x 5 minutes, 20 cm/sec resistance.  Abdominal pain limited pt's endurance today.  PM-  Pt reported abdominal pain resolved after BM.  L hip pain 8/10, premedicated.  Light weight, wide RW had been delivered; it was adjusted to correct height.    Recliner > stand with RW with supervision, VCs to shift wt well forward.  Gait with RW x 60' with supervision.  Pt c/o weakness LLE requiring seated rest.    Sit> stand from w/c at 20.5" height required min assist 3/4 tries.  Pt concerned that he will not be able to stand easily from w/c at home.  Therapist advised him not to use w/c at home, only in community when a helper would push him.  An extra cushion could be added PRN.    Pt performed [place] with 2# on each ankle: 10 x 1 each in standing: mini-squats, L and R open chain hip abduction, hamstring curls, calf raises; 10 x 1 each in sitting: L and R knee extension with isometric contraction at end range.  Gait up/down 5 steps with 2 rails, supervision> min assist, VCs for technique.  Pt noted to have blood seeping from recent abdominal injection site; RN notified.    Therapy Documentation Precautions:   Precautions Precautions: Fall Precaution Comments: weaning off O2 Restrictions Weight Bearing Restrictions: No   Vital Signs: Therapy Vitals Pulse Rate: 67  Oxygen Therapy SpO2: 96 % (after 3 min exercise) O2 Device: None (Room air) Pain: Pain Assessment Pain Score:   8 Pain Location: Hip Pain Orientation: Left Pain Intervention(s): Medication (See eMAR)     See FIM for current functional status  Therapy/Group: Individual Therapy  Sonita Michiels 09/20/2012, 10:42 AM

## 2012-09-20 NOTE — Progress Notes (Signed)
Social Work Patient ID: [name], male   DOB: 01/26/1946, 66 y.o.   MRN: [id number] Pt requires a lightweight wheelchair to assist him with his self care and ability to self propel.  He is unable to self propel a standard weight wheelchair. He has CHF,COPD and A-Fib which impedes his ability to propel himself.

## 2012-09-21 ENCOUNTER — Ambulatory Visit (HOSPITAL_COMMUNITY): Payer: Medicare Other

## 2012-09-21 DIAGNOSIS — R5381 Other malaise: Secondary | ICD-10-CM

## 2012-09-21 DIAGNOSIS — I509 Heart failure, unspecified: Secondary | ICD-10-CM

## 2012-09-21 LAB — BASIC METABOLIC PANEL
BUN: 75 mg/dL — ABNORMAL HIGH (ref 6–23)
Chloride: 96 mEq/L (ref 96–112)
Creatinine, Ser: 1.65 mg/dL — ABNORMAL HIGH (ref 0.50–1.35)
Glucose, Bld: 115 mg/dL — ABNORMAL HIGH (ref 70–99)
Potassium: 4.6 mEq/L (ref 3.5–5.1)

## 2012-09-21 LAB — PRO B NATRIURETIC PEPTIDE: Pro B Natriuretic peptide (BNP): 4036 pg/mL — ABNORMAL HIGH (ref 0–125)

## 2012-09-21 LAB — GLUCOSE, CAPILLARY: Glucose-Capillary: 111 mg/dL — ABNORMAL HIGH (ref 70–99)

## 2012-09-21 MED ORDER — LACTULOSE 10 GM/15ML PO SOLN: 30.0000 g | Freq: Every day | ORAL | Status: DC

## 2012-09-21 MED ORDER — SALINE SPRAY 0.65 % NA SOLN: 1.0000 | NASAL | Status: DC | PRN

## 2012-09-21 MED ORDER — INSULIN GLARGINE 100 UNIT/ML ~~LOC~~ SOLN: 10.0000 [IU] | Freq: Every day | SUBCUTANEOUS | Status: DC

## 2012-09-21 MED ORDER — TORSEMIDE 20 MG PO TABS: 80.0000 mg | ORAL_TABLET | Freq: Every day | ORAL | Status: DC

## 2012-09-21 NOTE — Progress Notes (Signed)
PICC Line removed per order. Line intact at 40 cm. Manual pressure held x 10 minutes. No active bleeding noted. Vaseline gauze pressure dressing applied and secured. Pt. Teaching- Keep dressing dry and intact x 24 hours. Pt. Verbalized understanding. Pt. Instructed to apply pressure and call RN if bleeding starts.

## 2012-09-21 NOTE — Progress Notes (Signed)
Social Work Discharge Note Discharge Note  The overall goal for the admission was met for:   Discharge location: Yes-HOME WITH DAUGHTER AND SON [place]  Length of Stay: Yes-13 DAYS  Discharge activity level: Yes-SUPERVISION/MIN LEVEL  Home/community participation: Yes  Services provided included: MD, RD, PT, OT, RN, TR, Pharmacy and SW  Financial Services: Medicare  Follow-up services arranged: Home Health: [facility]-PT,OT,RN, DME: [facility]-WHEELCHAIR,WIDE ROLLING WALKER and Patient/Family request agency HH: [facility] BEFORE, DME: [facility]  Comments (or additional information):FAMILY EDUCATION COMPLETED, BOTH COMFORTABLE WITH CARE. PT DECLINES SUBSTANCE ABUSE SERVICES, FEELS NOT AN ISSUE.  Patient/Family verbalized understanding of follow-up arrangements: Yes  Individual responsible for coordination of the follow-up plan: [name]-DAUGHTER  Confirmed correct DME delivered: Lucy Chris 09/21/2012    Lucy Chris

## 2012-09-21 NOTE — Progress Notes (Signed)
Occupational Therapy Discharge Summary  Patient Details  Name: [name] MRN: [id number] Date of Birth: [birth year]  Today's Date: 09/21/2012  Patient has met 9 of 9 long term goals due to improved activity tolerance and improved balance.  Patient to discharge at overall Mod Assist level with LB adls, supervision with ADL transfers.  Patient's care partner is independent to provide the necessary physical assistance at discharge.    Reasons goals not met: n/a  Recommendation:  Patient will benefit from ongoing skilled OT services in home health setting to continue to advance functional skills in the area of BADL.  Equipment: No equipment provided. Pt may need a shower chair or tub bench at home. He can physically use both with min assist but need HHOT to evaluate lay out of house.  Reasons for discharge: treatment goals met  Patient/family agrees with progress made and goals achieved: Yes  OT Discharge ADL -  Refer to FIM   Vision/Perception  Vision - History Baseline Vision: Wears glasses all the time Patient Visual Report: No change from baseline Vision - Assessment Eye Alignment: Within Functional Limits Perception Perception: Within Functional Limits Praxis Praxis: Intact  Cognition Overall Cognitive Status: Impaired at baseline Orientation Level: Oriented X4 (flucuates ) Sensation Sensation Light Touch Impaired Details: Absent RLE;Absent LLE Stereognosis: Impaired by gross assessment Hot/Cold: Appears Intact Proprioception Impaired Details: Absent RLE;Absent LLE Coordination Gross Motor Movements are Fluid and Coordinated: No Fine Motor Movements are Fluid and Coordinated: No Motor  Motor Motor - Skilled Clinical Observations: generalized weakness and limited use of LUE due to hand contracture Mobility  Transfers Stand to Sit: 5: Supervision  Trunk/Postural Assessment  Cervical Assessment Cervical Assessment: Within Functional Limits Thoracic  Assessment Thoracic Assessment: Within Functional Limits Lumbar Assessment Lumbar Assessment: Within Functional Limits Postural Control Postural Control: Within Functional Limits  Balance Static Standing Balance Static Standing - Level of Assistance: 5: Stand by assistance Extremity/Trunk Assessment RUE Assessment RUE Assessment: Within Functional Limits LUE Assessment LUE Assessment: Exceptions to [facility] (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

## 2012-09-21 NOTE — Progress Notes (Signed)
Pt discharged at 1400 To home with daughter. Pt and daughter were given discharge instructions by Marissa Nestle, PA with verbal understanding. Educated and reviewed with pt and daughter insulin(lantus), diet and medication regimen. Belongings with pt and no further questions at this times.

## 2012-09-25 NOTE — Discharge Summary (Signed)
  Discharge summary job # [id number]

## 2012-09-25 NOTE — Discharge Summary (Signed)
[name], DUELL NO.:  [id number]  MEDICAL RECORD NO.:  [id number]  LOCATION:  4144                         FACILITY:  [facility]  PHYSICIAN:  Erick Colace, M.D.DATE OF BIRTH:  02-22-1946  DATE OF ADMISSION:  09/08/2012 DATE OF DISCHARGE:  09/21/2012                              DISCHARGE SUMMARY   DISCHARGE DIAGNOSES:  Deconditioning related to congestive heart failure with toxic encephalopathy, subcutaneous heparin for deep venous thrombosis prophylaxis, pain management, hypertension with atrial fibrillation, pseudomonas urinary tract infection resolved, questionable cellulitis lower extremities, vancomycin initiated September 08, 2012 x7 days, diabetes mellitus, incidental findings of lumbar L1 transverse process fracture, acute versus subacute hypothyroidism, hyperlipidemia, chronic renal insufficiency, chronic obstructive pulmonary disease.  A 66 year old right handed male with COPD, atrial fibrillation, not a candidate secondary to alcohol use as well as chronic diastolic congestive heart failure who is admitted August 30, 2012 with altered mental status, oxygen saturations 85% on room air, findings of elevated creatinine 1.93, cardiac enzymes unremarkable.  Chest x-ray completed showing moderate pulmonary vascular congestion with suspect congestive heart failure.  Echocardiogram with ejection fraction of 60% and moderate RV dilatation.  The patient placed on intravenous Lasix, followed per Cardiology Services for volume overload.  His Lasix was adjusted to Demadex, subcutaneous heparin for DVT prophylaxis.  HOSPITAL COURSE:  Urinary tract infection, maintained on Cipro for pseudomonas UTI.  The patient with multiple soft tissue masses on his back bilaterally, primarily along the left flank hematoma and left leg that was chronic in nature.  Noncontrast CT of abdomen and pelvis showed interval decrease in size of previously described right-sided  presumed subcutaneous hematoma.  Noted increased cellulitic changes, lower extremity, questionable cellulitis, placed on vancomycin x7 days. Incidental findings of acute, subacute L1 transverse process fracture which was discussed with Neurosurgery, advised conservative care.  The patient was admitted for a comprehensive rehab program.  PAST MEDICAL HISTORY:  See discharge diagnoses.  SOCIAL HISTORY:  Lives with daughter, 1 level home.  Functional history prior to admission was sedentary.  He did not navigate stairs. Functional status upon admission to rehab services was ambulating 25 feet with a rolling walker, +2 total assist for stand pivot transfers.  PHYSICAL EXAMINATION:  VITAL SIGNS:  Blood pressure 128/62, pulse 61, temperature 97.9, respirations 18. GENERAL:  This was an alert male in no acute distress. HEENT:  Poor dentition.  Pupils round and reactive to light. CARDIAC:  Rate controlled. LUNGS:  Decreased breath sounds, clear to auscultation. ABDOMEN:  Obese, soft, nontender. EXTREMITIES:  Edema 2+, lower extremities. NEUROLOGIC:  He was able to name his age, needed some subtle cues for date of birth, followed 3-step commands.  He was a poor medical historian.  REHABILITATION HOSPITAL COURSE:  The patient was admitted to inpatient rehab services with therapies initiated on a 3-hour daily basis consisting of physical therapy, occupational therapy, speech therapy and rehabilitation nursing.  The following issues were addressed during the patient's rehabilitation stay.  Pertaining to Mr. Gayheart's deconditioning related congestive heart failure, followed per Cardiology Services, oxygen saturations greater than 90% on room air.  His mental status continued to improve.  He was maintained on subcutaneous heparin for DVT  prophylaxis.  Pain management with the use of Neurontin 300 mg daily as well as Percocet as needed.  He remained on Prozac for mood, depression with emotional  support provided.  Blood pressures monitored with adjustments as per Cardiology Services.  He completed a course of Cipro for pseudomonas urinary tract infections denying any hematuria.  He completed 7-day course of vancomycin for question of cellulitis, lower extremities.  He did have a history of diabetes mellitus, a hemoglobin A1c of 5.8.  He remained on low dose insulin therapy.  Chronic renal insufficiency 1.58, monitored closely while on diuretics per Cardiology Services.  The patient received weekly collaborative interdisciplinary team conferences to discuss estimated length of stay, family teaching, and any barriers to discharge.  He was continent of bowel and bladder needing some assistance to use the urinal.  Moderate assist for toileting, minimal assist lower body dressing and bathing.  Supervision with toilet transfers with a rolling walker.  Excellent overall progress with endurance.  Minimal assist for sit to stand, ambulating 100-150 feet with assistive device.  Full family teaching was completed with his daughter and plan was to be discharged to home.  DISCHARGE MEDICATIONS: 1. Aspirin 325 mg daily. 2. Coreg 6.25 mg twice daily. 3. Prozac 20 mg daily. 4. Flovent inhaler twice daily. 5. Folic acid 1 mg daily. 6. Neurontin 300 mg daily. 7. Lantus insulin 10 units at bedtime. 8. Synthroid 150 mcg daily. 9. Multivitamin daily. 10.Prilosec 20 mg daily. 11.Percocet 1-2 tablets every 6 hours as needed for pain. 12.Potassium chloride 40 mEq daily. 13.Zocor 40 mg at bedtime. 14.Aldactone 25 mg daily. 15.Demadex 80 mg daily.  DIET:  Diabetic diet.  SPECIAL INSTRUCTIONS:  The patient would follow up with Dr. [name], Cardiology Services, call for appointment, [name] September 29, 2012, Dr. Claudette Laws at the outpatient services as needed.     Mariam Dollar, P.A.   ______________________________ Erick Colace, M.D.    DA/MEDQ  D:   09/25/2012  T:  09/25/2012  Job:  [id number]  cc:   Erick Colace, M.D. [name], MD Bevelyn Buckles. Bensimhon, MD

## 2012-09-27 ENCOUNTER — Telehealth: Payer: Self-pay

## 2012-09-27 NOTE — Telephone Encounter (Signed)
Antonio a physical therapist called from [facility] to get verbal orders for physical therapy.

## 2012-09-27 NOTE — Telephone Encounter (Signed)
Called and gave verbal orders to [facility] home care that it was ok to extend PT orders.

## 2012-09-28 ENCOUNTER — Ambulatory Visit (HOSPITAL_COMMUNITY)
Admit: 2012-09-28 | Discharge: 2012-09-28 | Disposition: A | Discharge status: Home / Self Care | Payer: Medicare Other | Attending: Internal Medicine | Admitting: Internal Medicine

## 2012-09-28 ENCOUNTER — Telehealth: Payer: Self-pay | Admitting: *Deleted

## 2012-09-28 ENCOUNTER — Encounter (HOSPITAL_COMMUNITY): Payer: Self-pay

## 2012-09-28 VITALS — BP 108/60 | HR 64 | Wt 268.4 lb

## 2012-09-28 DIAGNOSIS — I5032 Chronic diastolic (congestive) heart failure: Secondary | ICD-10-CM

## 2012-09-28 DIAGNOSIS — G4733 Obstructive sleep apnea (adult) (pediatric): Secondary | ICD-10-CM

## 2012-09-28 LAB — BASIC METABOLIC PANEL
CO2: 29 mEq/L (ref 19–32)
Calcium: 10.3 mg/dL (ref 8.4–10.5)
Glucose, Bld: 154 mg/dL — ABNORMAL HIGH (ref 70–99)
Potassium: 5.6 mEq/L — ABNORMAL HIGH (ref 3.5–5.1)
Sodium: 135 mEq/L (ref 135–145)

## 2012-09-28 NOTE — Telephone Encounter (Signed)
Left message on identifiable voicemail that patient does not have any upcoming appointments in our office.

## 2012-09-28 NOTE — Patient Instructions (Addendum)
May take extra torsemide for increased weight.    Will have legs wrapped through home care.  Labs today.    Follow up 2 weeks.

## 2012-09-28 NOTE — Progress Notes (Signed)
[name] - PCP  Weight Range   270 pounds  Baseline proBNP   4000 09/21/12    HPI: [name] is a 66 yo male with PMX s/f CAD (s/p CABG x 4 in 2001, normal Myoview 2010), chronic diastolic CHF, persistent a-fib, DM, HTN, HL, carotid artery disease, COPD, hypothyroidism, EtOH abuse, chronic venous insufficiency and morbid obesity.  He is not anticoagulants due to ETOH abuse and fall risk.   Echo 09/2011: LVEF 45-50%, mod biatral enlargement, moderate RV dilatation, moderate TR, PASP 66 mmHg.  ECHO 08/2012 EF 55-60% moderate bilateral RA/LA enlargement  Admitted 08/30/12 for acute on chronic diastolic CHF. He has been diuresed with IV lasix 40 mg bid.   Diuresed with discharge weight of 276 pounds.  He went to CIR and diuretics changed to torsemide 40 mg daily.  This was titrated to 80 mg daily in rehab.    He returns for post hospital follow up today.  He feels well.  He is living at home with his daughter and friend.  His weight was 268 pounds on day of discharge and fell to 261 pounds until today where it went back up to 263 pounds.  He states his breathing is good.  He uses walker around the house.  His daughter is helping with food.  He has a CPAP but can not use it because he can't use the snaps on his mask.      [facility] ->Susan [phone]   ROS: All systems negative except as listed in HPI, PMH and Problem List.  Past Medical History  Diagnosis Date  . CHRONIC OBSTRUCTIVE PULMONARY DISEASE 06/20/2009  . OBSTRUCTIVE SLEEP APNEA 06/20/2009  . CAROTID STENOSIS 06/20/2009    A. 08/2001 s/p L CEA;  B.   09/14/11 - Carotid U/S - 40-59% bilateral stenosis, left CEA patch angioplasty is patent  . DM 06/20/2009  . CAD 06/20/2009    A.  08/2000 - s/p CABG x 4 - LIMA-LAD, Left Radial-OM, VG-DIAG, VG-RCA;  B. Neg. MV  2010  . HYPERLIPIDEMIA 06/20/2009  . HYPERTENSION 06/20/2009  . Hypothyroidism   . Low back pain   . Asthma     as child  . Pneumonia   . Atrial fibrillation     Not felt  to be coumadin candidate 2/2 ETOH use.  Marland Kitchen Hypothyroidism   . ETOH abuse   . History of tobacco abuse     remote - quit 1970  . Bilateral renal cysts   . Marijuana abuse   . Morbidly obese   . CAD (coronary artery disease)   . CHF (congestive heart failure)   . Falls frequently     Current Outpatient Prescriptions  Medication Sig Dispense Refill  . aspirin 325 MG tablet Take 325 mg by mouth daily.        . carvedilol (COREG) 6.25 MG tablet Take 1 tablet (6.25 mg total) by mouth 2 (two) times daily with a meal.      . FLUoxetine (PROZAC) 20 MG capsule Take 20 mg by mouth daily.        . fluticasone (FLOVENT HFA) 220 MCG/ACT inhaler Inhale 1 puff into the lungs 2 (two) times daily.        . folic acid (FOLVITE) 1 MG tablet Take 1 tablet (1 mg total) by mouth daily.      Marland Kitchen gabapentin (NEURONTIN) 300 MG capsule Take 1 capsule (300 mg total) by mouth daily.      . insulin glargine (  LANTUS) 100 UNIT/ML injection Inject 10 Units into the skin at bedtime.  10 mL  1  . lactulose (CHRONULAC) 10 GM/15ML solution Take 45 mLs (30 g total) by mouth daily.  240 mL    . levothyroxine (SYNTHROID, LEVOTHROID) 150 MCG tablet Take 150 mcg by mouth daily.        Marland Kitchen omeprazole (PRILOSEC) 20 MG capsule Take 20 mg by mouth daily.        Marland Kitchen oxyCODONE-acetaminophen (PERCOCET/ROXICET) 5-325 MG per tablet Take 1-2 tablets by mouth every 6 (six) hours as needed.  30 tablet    . potassium chloride SA (K-DUR,KLOR-CON) 20 MEQ tablet Take 2 tablets (40 mEq total) by mouth 2 (two) times daily.      Marland Kitchen senna (SENOKOT) 8.6 MG tablet Take 1 tablet by mouth daily. As needed      . simvastatin (ZOCOR) 40 MG tablet Take 40 mg by mouth at bedtime.        . sodium chloride (OCEAN) 0.65 % SOLN nasal spray Place 1 spray into the nose as needed for congestion.      Marland Kitchen spironolactone (ALDACTONE) 25 MG tablet Take 1 tablet (25 mg total) by mouth daily.      Marland Kitchen torsemide (DEMADEX) 20 MG tablet Take 4 tablets (80 mg total) by mouth daily.   120 tablet  1  . Multiple Vitamin (MULTIVITAMIN) capsule Take 1 capsule by mouth daily.      . polyethylene glycol (MIRALAX / GLYCOLAX) packet Take 17 g by mouth daily as needed.  14 each       PHYSICAL EXAM: Filed Vitals:   09/28/12 0857  BP: 108/60  Pulse: 64  Weight: 268 lb 6.4 oz (121.745 kg)  SpO2: 91%    General:  Chronically ill appearing. No resp difficulty HEENT: normal Neck: supple. JVP difficult to assess due to body habitus.  Carotids 2+ bilaterally; no bruits. No lymphadenopathy or thryomegaly appreciated. Cor: PMI normal. Regular rate & rhythm. No rubs, gallops. 1/6 SEM RUSB Lungs: distant but clear Abdomen: obese, soft, nontender, nondistended. No hepatosplenomegaly. No bruits or masses. Good bowel sounds. Extremities: no cyanosis, clubbing, rash, 2+ tight edema with erythema no open wounds Neuro: alert & orientedx3, cranial nerves grossly intact. Moves all 4 extremities w/o difficulty. Affect pleasant.    ASSESSMENT & PLAN:

## 2012-09-28 NOTE — Telephone Encounter (Signed)
Lease call regarding labs orders. Patient has appointment tomorrow at 8:30. They are not allowed to go out and see patient same day as appointments?

## 2012-09-30 NOTE — Assessment & Plan Note (Signed)
Have asked patient to discuss with [name] if there are other options for CPAP mask as this is important to wear nightly.  Continue O2 at night until that time.

## 2012-09-30 NOTE — Assessment & Plan Note (Addendum)
Volume status stable since discharge but lower extremity edema persist.  Will have [facility] wrap legs weekly with Profor.  Continue current therapy.  Have had an extensive discussion with the patient and daughter about low sodium diet and fluid restrictions, reviewed the living with HF booklet.  Have encouraged them to take advantage of the nutrition class.  Will continue to follow closely in the heart failure clinic.

## 2012-10-03 ENCOUNTER — Telehealth: Payer: Self-pay | Admitting: Physician Assistant

## 2012-10-03 NOTE — Telephone Encounter (Signed)
[name] with [facility] called in to the answering service (xmas eve) regarding orders for [name]. She received orders today faxed in to wrap legs with Profor and do BMET on Thursday. However, she already had these orders (wrap legs, BMET) sent in last week to perform for today. D/w Dr. [name] to clarify.  Will disregard duplicate order faxed in today and continue on as originally ordered. Darl Pikes made aware who verbalized understanding. Ronie Spies

## 2012-10-03 NOTE — Telephone Encounter (Signed)
Received call from [facility] regarding BUN (67) today. Cr 1.20. This is in line with prior lab work, including at last assessment when no significant med changes were made (except discontinuation of potassium). Continue current course. Will fwrd to ordering provider for further review. Dayna Dunn PA-C

## 2012-10-05 ENCOUNTER — Telehealth: Payer: Self-pay

## 2012-10-05 NOTE — Telephone Encounter (Signed)
Patient called earlier today saying his legs hurt from wearing the hose.  His occupational therapist also called to inform us that there has been a scheduling issue and she will not be visiting the patient today.

## 2012-10-05 NOTE — Telephone Encounter (Signed)
Spoke with patients daughter.  Advised her she could loosen bandages as needed.  He will be evaluated by his home health nurse and they will call if any other problems.

## 2012-10-13 ENCOUNTER — Encounter (HOSPITAL_COMMUNITY): Payer: Self-pay

## 2012-10-13 ENCOUNTER — Ambulatory Visit (HOSPITAL_COMMUNITY)
Admission: RE | Admit: 2012-10-13 | Discharge: 2012-10-13 | Disposition: A | Discharge status: Home / Self Care | Payer: Medicare Other | Source: Ambulatory Visit | Attending: Internal Medicine | Admitting: Internal Medicine

## 2012-10-13 VITALS — BP 132/74 | HR 70 | Wt 266.8 lb

## 2012-10-13 DIAGNOSIS — I5032 Chronic diastolic (congestive) heart failure: Secondary | ICD-10-CM

## 2012-10-13 DIAGNOSIS — I10 Essential (primary) hypertension: Secondary | ICD-10-CM

## 2012-10-13 DIAGNOSIS — G4733 Obstructive sleep apnea (adult) (pediatric): Secondary | ICD-10-CM

## 2012-10-13 MED ORDER — CARVEDILOL 3.125 MG PO TABS: 3.1250 mg | ORAL_TABLET | Freq: Two times a day (BID) | ORAL | Status: DC

## 2012-10-13 NOTE — Patient Instructions (Addendum)
Add carvedilol 3.125 mg to 6.25 mg twice daily  Follow up 1 month.

## 2012-10-13 NOTE — Progress Notes (Signed)
[name] - PCP  Weight Range   270 pounds  Baseline proBNP   4000 09/21/12    HPI: Mr. Febles is a 67 yo male with PMX s/f CAD (s/p CABG x 4 in 2001, normal Myoview 2010), chronic diastolic CHF, persistent a-fib, DM, HTN, HL, carotid artery disease, COPD, hypothyroidism, EtOH abuse, chronic venous insufficiency and morbid obesity.  He is not anticoagulants due to ETOH abuse and fall risk.   Echo 09/2011: LVEF 45-50%, mod biatral enlargement, moderate RV dilatation, moderate TR, PASP 66 mmHg.  ECHO 08/2012 EF 55-60% moderate bilateral RA/LA enlargement  Admitted 08/30/12 for acute on chronic diastolic CHF. He has been diuresed with IV lasix 40 mg bid.   Diuresed with discharge weight of 276 pounds.  He went to CIR and diuretics changed to torsemide 40 mg daily.  This was titrated to 80 mg daily in rehab.    He returns for post hospital follow up today.  He feels well.  He is living at home with his daughter and friend.  His weight was 268 pounds on day of discharge and fell to 261 pounds until today where it went back up to 263 pounds.  He states his breathing is good.  He uses walker around the house.  His daughter is helping with food.  He has a CPAP but can not use it because he can't use the snaps on his mask.     He returns for follow up today with his daughter and family friend.  He feels well.  He was having his legs wrapped by [facility] but wraps fell down and he cut them off today.  He is working on low sodium diet.  May still be drinking of 2 liters a day.  Labs drawn by [facility]: 09/30/12 Cr 1.3, BUN 64, K 4.8.   He denies orthopnea/PND.  Denies dyspnea.     ROS: All systems negative except as listed in HPI, PMH and Problem List.  Past Medical History  Diagnosis Date  . CHRONIC OBSTRUCTIVE PULMONARY DISEASE 06/20/2009  . OBSTRUCTIVE SLEEP APNEA 06/20/2009  . CAROTID STENOSIS 06/20/2009    A. 08/2001 s/p L CEA;  B.   09/14/11 - Carotid U/S - 40-59% bilateral stenosis, left CEA patch  angioplasty is patent  . DM 06/20/2009  . CAD 06/20/2009    A.  08/2000 - s/p CABG x 4 - LIMA-LAD, Left Radial-OM, VG-DIAG, VG-RCA;  B. Neg. MV  2010  . HYPERLIPIDEMIA 06/20/2009  . HYPERTENSION 06/20/2009  . Hypothyroidism   . Low back pain   . Asthma     as child  . Pneumonia   . Atrial fibrillation     Not felt to be coumadin candidate 2/2 ETOH use.  Marland Kitchen Hypothyroidism   . ETOH abuse   . History of tobacco abuse     remote - quit 1970  . Bilateral renal cysts   . Marijuana abuse   . Morbidly obese   . CAD (coronary artery disease)   . CHF (congestive heart failure)   . Falls frequently     Current Outpatient Prescriptions  Medication Sig Dispense Refill  . aspirin 325 MG tablet Take 325 mg by mouth daily.        . carvedilol (COREG) 6.25 MG tablet Take 1 tablet (6.25 mg total) by mouth 2 (two) times daily with a meal.      . FLUoxetine (PROZAC) 20 MG capsule Take 20 mg by mouth daily.        Marland Kitchen  fluticasone (FLOVENT HFA) 220 MCG/ACT inhaler Inhale 1 puff into the lungs 2 (two) times daily.        . folic acid (FOLVITE) 1 MG tablet Take 1 tablet (1 mg total) by mouth daily.      Marland Kitchen gabapentin (NEURONTIN) 300 MG capsule Take 1 capsule (300 mg total) by mouth daily.      . insulin glargine (LANTUS) 100 UNIT/ML injection Inject 10 Units into the skin at bedtime.  10 mL  1  . lactulose (CHRONULAC) 10 GM/15ML solution Take 45 mLs (30 g total) by mouth daily.  240 mL    . levothyroxine (SYNTHROID, LEVOTHROID) 150 MCG tablet Take 150 mcg by mouth daily.        . Multiple Vitamin (MULTIVITAMIN) capsule Take 1 capsule by mouth daily.      Marland Kitchen omeprazole (PRILOSEC) 20 MG capsule Take 20 mg by mouth daily.        Marland Kitchen oxyCODONE-acetaminophen (PERCOCET/ROXICET) 5-325 MG per tablet Take 1-2 tablets by mouth every 6 (six) hours as needed.  30 tablet    . polyethylene glycol (MIRALAX / GLYCOLAX) packet Take 17 g by mouth daily as needed.  14 each    . senna (SENOKOT) 8.6 MG tablet Take 1 tablet by mouth  daily. As needed      . simvastatin (ZOCOR) 40 MG tablet Take 40 mg by mouth at bedtime.        . sodium chloride (OCEAN) 0.65 % SOLN nasal spray Place 1 spray into the nose as needed for congestion.      Marland Kitchen spironolactone (ALDACTONE) 25 MG tablet Take 1 tablet (25 mg total) by mouth daily.      Marland Kitchen torsemide (DEMADEX) 20 MG tablet Take 100 mg by mouth daily.         PHYSICAL EXAM: Filed Vitals:   10/13/12 1100  BP: 132/74  Pulse: 70  Weight: 266 lb 12.8 oz (121.02 kg)  SpO2: 91%    General:  Chronically ill appearing. No resp difficulty HEENT: normal Neck: supple. JVP difficult to assess due to body habitus.  Carotids 2+ bilaterally; no bruits. No lymphadenopathy or thryomegaly appreciated. Cor: PMI normal. Regular rate & rhythm. No rubs, gallops. 1/6 SEM RUSB Lungs: distant but clear Abdomen: obese, soft, nontender, nondistended. No hepatosplenomegaly. No bruits or masses. Good bowel sounds. Extremities: no cyanosis, clubbing, rash, 2+ tight edema with erythema and dimpling no open wounds Neuro: alert & orientedx3, cranial nerves grossly intact. Moves all 4 extremities w/o difficulty. Affect pleasant.    ASSESSMENT & PLAN:

## 2012-10-15 NOTE — Assessment & Plan Note (Signed)
Volume status stable but still with lower extremity edema.  Mildly improved after Profor wraps.  Will have HH continue to place weekly.  Continue current diuretics and sliding scale.  Will titrate carvedilol 9.375 mg BID.

## 2012-10-15 NOTE — Assessment & Plan Note (Signed)
Will follow up with [name] for other options to CPAP mask.

## 2012-10-15 NOTE — Assessment & Plan Note (Signed)
As above, increase carvedilol 9.375 mg BID.

## 2012-10-31 ENCOUNTER — Telehealth: Payer: Self-pay | Admitting: *Deleted

## 2012-10-31 NOTE — Telephone Encounter (Signed)
Verbal given to continue care.

## 2012-10-31 NOTE — Telephone Encounter (Signed)
Request extension of visit 2xwk1 and 1xwk1. [facility] will fax order over.

## 2012-11-08 ENCOUNTER — Telehealth: Payer: Self-pay

## 2012-11-08 NOTE — Telephone Encounter (Signed)
[name] with [facility] would like to recert patient.  He is doing well with the leg wraps when he actually wears them.  He is taking them off before the nurse is able to come back out to re-wrap.  Verbal orders given to continue care.

## 2012-11-09 ENCOUNTER — Other Ambulatory Visit: Payer: Self-pay | Admitting: *Deleted

## 2012-11-10 ENCOUNTER — Other Ambulatory Visit: Payer: Self-pay | Admitting: *Deleted

## 2012-11-13 ENCOUNTER — Encounter (HOSPITAL_COMMUNITY): Payer: Self-pay

## 2012-11-13 ENCOUNTER — Ambulatory Visit (HOSPITAL_COMMUNITY)
Admission: RE | Admit: 2012-11-13 | Discharge: 2012-11-13 | Disposition: A | Discharge status: Home / Self Care | Payer: Medicare Other | Source: Ambulatory Visit | Attending: Internal Medicine | Admitting: Internal Medicine

## 2012-11-13 VITALS — BP 108/64 | HR 57 | Wt 255.8 lb

## 2012-11-13 DIAGNOSIS — I5032 Chronic diastolic (congestive) heart failure: Secondary | ICD-10-CM | POA: Insufficient documentation

## 2012-11-13 LAB — BASIC METABOLIC PANEL
BUN: 64 mg/dL — ABNORMAL HIGH (ref 6–23)
Chloride: 97 mEq/L (ref 96–112)
GFR calc Af Amer: 64 mL/min — ABNORMAL LOW (ref >90)
GFR calc non Af Amer: 55 mL/min — ABNORMAL LOW (ref >90)
Glucose, Bld: 162 mg/dL — ABNORMAL HIGH (ref 70–99)
Potassium: 4 mEq/L (ref 3.5–5.1)
Sodium: 137 mEq/L (ref 135–145)

## 2012-11-13 MED ORDER — TORSEMIDE 20 MG PO TABS: 100.0000 mg | ORAL_TABLET | Freq: Every day | ORAL | Status: DC

## 2012-11-13 NOTE — Patient Instructions (Addendum)
Follow up in 1 month   Do the following things EVERYDAY: 1) Weigh yourself in the morning before breakfast. Write it down and keep it in a log. 2) Take your medicines as prescribed 3) Eat low salt foods-Limit salt (sodium) to 2000 mg per day.  4) Stay as active as you can everyday 5) Limit all fluids for the day to less than 2 liters 

## 2012-11-13 NOTE — Progress Notes (Signed)
Patient ID: [name], male   DOB: 1945/11/30, 67 y.o.   MRN: [id number] [name] - PCP  Weight Range   270 pounds  Baseline proBNP   4000 09/21/12    HPI: [name] is a 67 yo male with PMX s/f CAD (s/p CABG x 4 in 2001, normal Myoview 2010), chronic diastolic CHF, persistent a-fib, DM, HTN, HL, carotid artery disease, COPD, hypothyroidism, EtOH abuse, chronic venous insufficiency and morbid obesity.  He is not anticoagulants due to ETOH abuse and fall risk.   Echo 09/2011: LVEF 45-50%, mod biatral enlargement, moderate RV dilatation, moderate TR, PASP 66 mmHg.  ECHO 08/2012 EF 55-60% moderate bilateral RA/LA enlargement  Admitted 08/30/12 for acute on chronic diastolic CHF. He has been diuresed with IV lasix 40 mg bid.   Diuresed with discharge weight of 276 pounds.    He returns for follow up today with his daughter. Last visit carvedilol increased to 9.375 mg twice a day. Denies SOB/PND/Orthopnea. Weight at home 249-250 pounds. He increased Torsemide to 100 mg daily with a better response.  [facility] follows at home for HHRN/HHPT/HHOT. Does not use CPAP. Uses oxygen at night. He has not had alcohol in in 2-3 months.  Follows low salt diet. Limiting fluid intake to < 2 liters per day. Ambulates with walker.       ROS: All systems negative except as listed in HPI, PMH and Problem List.  Past Medical History  Diagnosis Date  . CHRONIC OBSTRUCTIVE PULMONARY DISEASE 06/20/2009  . OBSTRUCTIVE SLEEP APNEA 06/20/2009  . CAROTID STENOSIS 06/20/2009    A. 08/2001 s/p L CEA;  B.   09/14/11 - Carotid U/S - 40-59% bilateral stenosis, left CEA patch angioplasty is patent  . DM 06/20/2009  . CAD 06/20/2009    A.  08/2000 - s/p CABG x 4 - LIMA-LAD, Left Radial-OM, VG-DIAG, VG-RCA;  B. Neg. MV  2010  . HYPERLIPIDEMIA 06/20/2009  . HYPERTENSION 06/20/2009  . Hypothyroidism   . Low back pain   . Asthma     as child  . Pneumonia   . Atrial fibrillation     Not felt to be coumadin candidate 2/2  ETOH use.  Marland Kitchen Hypothyroidism   . ETOH abuse   . History of tobacco abuse     remote - quit 1970  . Bilateral renal cysts   . Marijuana abuse   . Morbidly obese   . CAD (coronary artery disease)   . CHF (congestive heart failure)   . Falls frequently     Current Outpatient Prescriptions  Medication Sig Dispense Refill  . aspirin 325 MG tablet Take 325 mg by mouth daily.        . carvedilol (COREG) 3.125 MG tablet Take 1 tablet (3.125 mg total) by mouth 2 (two) times daily with a meal. Add to carvedilol 6.25 mg to equal 9.375 mg total  60 tablet  2  . carvedilol (COREG) 6.25 MG tablet Take 1 tablet (6.25 mg total) by mouth 2 (two) times daily with a meal.      . FLUoxetine (PROZAC) 20 MG capsule Take 20 mg by mouth daily.        . fluticasone (FLOVENT HFA) 220 MCG/ACT inhaler Inhale 1 puff into the lungs 2 (two) times daily.        . folic acid (FOLVITE) 1 MG tablet Take 1 tablet (1 mg total) by mouth daily.      Marland Kitchen gabapentin (NEURONTIN) 300 MG capsule Take 1  capsule (300 mg total) by mouth daily.      . insulin glargine (LANTUS) 100 UNIT/ML injection Inject 10 Units into the skin at bedtime.  10 mL  1  . lactulose (CHRONULAC) 10 GM/15ML solution Take 45 mLs (30 g total) by mouth daily.  240 mL    . levothyroxine (SYNTHROID, LEVOTHROID) 150 MCG tablet Take 150 mcg by mouth daily.        . Multiple Vitamin (MULTIVITAMIN) capsule Take 1 capsule by mouth daily.      Marland Kitchen omeprazole (PRILOSEC) 20 MG capsule Take 20 mg by mouth daily.        Marland Kitchen oxyCODONE-acetaminophen (PERCOCET/ROXICET) 5-325 MG per tablet Take 1-2 tablets by mouth every 6 (six) hours as needed.  30 tablet    . polyethylene glycol (MIRALAX / GLYCOLAX) packet Take 17 g by mouth daily as needed.  14 each    . senna (SENOKOT) 8.6 MG tablet Take 1 tablet by mouth daily. As needed      . simvastatin (ZOCOR) 40 MG tablet Take 40 mg by mouth at bedtime.        . sodium chloride (OCEAN) 0.65 % SOLN nasal spray Place 1 spray into the nose  as needed for congestion.      Marland Kitchen spironolactone (ALDACTONE) 25 MG tablet Take 1 tablet (25 mg total) by mouth daily.      Marland Kitchen torsemide (DEMADEX) 20 MG tablet Take 100 mg by mouth daily.         PHYSICAL EXAM: Filed Vitals:   11/13/12 1149  BP: 108/64  Pulse: 57  Weight: 255 lb 12.8 oz (116.03 kg)  SpO2: 94%    General:  Chronically ill appearing. No resp difficulty daughter present HEENT: normal Neck: supple. JVP 7-8  Carotids 2+ bilaterally; no bruits. No lymphadenopathy or thryomegaly appreciated. Cor: PMI normal. Regular rate & rhythm. No rubs, gallops. 1/6 SEM RUSB Lungs: RLL LLL crackles.  Abdomen: obese, soft, nontender, nondistended. No hepatosplenomegaly. No bruits or masses. Good bowel sounds. Extremities: no cyanosis, clubbing, rash, LLE and RLE 1+ edema with erythema and dimpling no open wounds Neuro: alert & orientedx3, cranial nerves grossly intact. Moves all 4 extremities w/o difficulty. Affect pleasant.    ASSESSMENT & PLAN:

## 2012-11-13 NOTE — Assessment & Plan Note (Signed)
Volume status stable. Continue current diuretic regimen. Check BMET Today. Follow up in 1 month.

## 2012-11-22 ENCOUNTER — Other Ambulatory Visit: Payer: Self-pay

## 2012-11-22 ENCOUNTER — Other Ambulatory Visit (HOSPITAL_COMMUNITY): Payer: Self-pay | Admitting: *Deleted

## 2012-11-22 MED ORDER — SPIRONOLACTONE 25 MG PO TABS: 25.0000 mg | ORAL_TABLET | Freq: Every day | ORAL | Status: DC

## 2012-11-22 MED ORDER — CARVEDILOL 6.25 MG PO TABS: 6.2500 mg | ORAL_TABLET | Freq: Two times a day (BID) | ORAL | Status: DC

## 2012-12-06 ENCOUNTER — Telehealth: Payer: Self-pay

## 2012-12-06 NOTE — Telephone Encounter (Signed)
[name] with [facility] called to follow up on leg dressings.  Patients edema has gone down and they are requesting ted hose.  Please advise

## 2012-12-06 NOTE — Telephone Encounter (Signed)
I no longer follow pt , please get HH orders from Tonye Becket NP at [facility] cardiology or PCP

## 2012-12-07 ENCOUNTER — Other Ambulatory Visit (HOSPITAL_COMMUNITY): Payer: Self-pay | Admitting: Physician Assistant

## 2012-12-08 NOTE — Telephone Encounter (Signed)
Left message advising patient is not longer being followed by our office and they would need to contact pcp or cardiologist for further orders.

## 2012-12-11 ENCOUNTER — Ambulatory Visit (HOSPITAL_COMMUNITY): Payer: Medicare Other

## 2012-12-13 ENCOUNTER — Telehealth: Payer: Self-pay

## 2012-12-13 NOTE — Telephone Encounter (Signed)
Darl Pikes with [facility] called to get orders for ted hose for patient.  Left message again for Darl Pikes informing her to contact patients PCP or cardiologist for orders because patient is no longer under our care.

## 2012-12-19 ENCOUNTER — Ambulatory Visit (HOSPITAL_COMMUNITY)
Admission: RE | Admit: 2012-12-19 | Discharge: 2012-12-19 | Disposition: A | Discharge status: Home / Self Care | Payer: Medicare Other | Source: Ambulatory Visit | Attending: Internal Medicine | Admitting: Internal Medicine

## 2012-12-19 VITALS — BP 108/54 | HR 60 | Wt 223.5 lb

## 2012-12-19 DIAGNOSIS — R5381 Other malaise: Secondary | ICD-10-CM

## 2012-12-19 DIAGNOSIS — J309 Allergic rhinitis, unspecified: Secondary | ICD-10-CM

## 2012-12-19 DIAGNOSIS — J302 Other seasonal allergic rhinitis: Secondary | ICD-10-CM

## 2012-12-19 DIAGNOSIS — I5032 Chronic diastolic (congestive) heart failure: Secondary | ICD-10-CM | POA: Insufficient documentation

## 2012-12-19 LAB — BASIC METABOLIC PANEL
BUN: 65 mg/dL — ABNORMAL HIGH (ref 6–23)
CO2: 33 mEq/L — ABNORMAL HIGH (ref 19–32)
Chloride: 96 mEq/L (ref 96–112)
Creatinine, Ser: 0.97 mg/dL (ref 0.50–1.35)
GFR calc Af Amer: >90 mL/min (ref >90)
Potassium: 4.1 mEq/L (ref 3.5–5.1)

## 2012-12-19 NOTE — Progress Notes (Signed)
[name] - PCP at [facility]  Weight Range   270 pounds  Baseline proBNP   4000 09/21/12    HPI: Mr. Currier is a 67 yo male with PMX s/f CAD (s/p CABG x 4 in 2001, normal Myoview 2010), chronic diastolic CHF, persistent a-fib, DM, HTN, HL, carotid artery disease, COPD, hypothyroidism, EtOH abuse, chronic venous insufficiency and morbid obesity.  He is not anticoagulants due to ETOH abuse and fall risk.   Echo 09/2011: LVEF 45-50%, mod biatral enlargement, moderate RV dilatation, moderate TR, PASP 66 mmHg.  ECHO 08/2012 EF 55-60% moderate bilateral RA/LA enlargement  Admitted 08/30/12 for acute on chronic diastolic CHF. He has been diuresed with IV lasix 40 mg bid.   Diuresed with discharge weight of 276 pounds.    He returns for follow up with his daughter today.  He has been doing very well!!  His diet has changed dramatically and tries to only eat fruits and vegetables.  He has lost 30 pounds in the last 1.5 months.  He is walking with his walker and has be discharged from PT/OT.  He continues to have [facility] through [facility] following.  He denies orthopnea, PND or edema.  Some dizziness but appears related to head cold with post nasal drainage.        ROS: All systems negative except as listed in HPI, PMH and Problem List.  Past Medical History  Diagnosis Date  . CHRONIC OBSTRUCTIVE PULMONARY DISEASE 06/20/2009  . OBSTRUCTIVE SLEEP APNEA 06/20/2009  . CAROTID STENOSIS 06/20/2009    A. 08/2001 s/p L CEA;  B.   09/14/11 - Carotid U/S - 40-59% bilateral stenosis, left CEA patch angioplasty is patent  . DM 06/20/2009  . CAD 06/20/2009    A.  08/2000 - s/p CABG x 4 - LIMA-LAD, Left Radial-OM, VG-DIAG, VG-RCA;  B. Neg. MV  2010  . HYPERLIPIDEMIA 06/20/2009  . HYPERTENSION 06/20/2009  . Hypothyroidism   . Low back pain   . Asthma     as child  . Pneumonia   . Atrial fibrillation     Not felt to be coumadin candidate 2/2 ETOH use.  Marland Kitchen Hypothyroidism   . ETOH abuse   . History of  tobacco abuse     remote - quit 1970  . Bilateral renal cysts   . Marijuana abuse   . Morbidly obese   . CAD (coronary artery disease)   . CHF (congestive heart failure)   . Falls frequently     Current Outpatient Prescriptions  Medication Sig Dispense Refill  . aspirin 325 MG tablet Take 325 mg by mouth daily.        . carvedilol (COREG) 3.125 MG tablet TAKE 1 TABLET TWICE A DAY WITH MEAL *ADD TO CARVEDILOL 6.25 TO EQUAL DOSE OF 9.375  60 tablet  1  . carvedilol (COREG) 6.25 MG tablet Take 1 tablet (6.25 mg total) by mouth 2 (two) times daily with a meal.  60 tablet  6  . FLUoxetine (PROZAC) 20 MG capsule Take 20 mg by mouth daily.        . fluticasone (FLOVENT HFA) 220 MCG/ACT inhaler Inhale 1 puff into the lungs 2 (two) times daily.        . folic acid (FOLVITE) 1 MG tablet Take 1 tablet (1 mg total) by mouth daily.      Marland Kitchen gabapentin (NEURONTIN) 300 MG capsule Take 1 capsule (300 mg total) by mouth daily.      Marland Kitchen  insulin glargine (LANTUS) 100 UNIT/ML injection Inject 10 Units into the skin at bedtime.  10 mL  1  . lactulose (CHRONULAC) 10 GM/15ML solution Take 45 mLs (30 g total) by mouth daily.  240 mL    . levothyroxine (SYNTHROID, LEVOTHROID) 150 MCG tablet Take 150 mcg by mouth daily.        . Multiple Vitamin (MULTIVITAMIN) capsule Take 1 capsule by mouth daily.      Marland Kitchen omeprazole (PRILOSEC) 20 MG capsule Take 20 mg by mouth daily.        Marland Kitchen oxyCODONE-acetaminophen (PERCOCET/ROXICET) 5-325 MG per tablet Take 1-2 tablets by mouth every 6 (six) hours as needed.  30 tablet    . polyethylene glycol (MIRALAX / GLYCOLAX) packet Take 17 g by mouth daily as needed.  14 each    . senna (SENOKOT) 8.6 MG tablet Take 1 tablet by mouth daily. As needed      . simvastatin (ZOCOR) 40 MG tablet Take 40 mg by mouth at bedtime.        . sodium chloride (OCEAN) 0.65 % SOLN nasal spray Place 1 spray into the nose as needed for congestion.      Marland Kitchen spironolactone (ALDACTONE) 25 MG tablet Take 1 tablet (25  mg total) by mouth daily.  30 tablet  6  . torsemide (DEMADEX) 20 MG tablet Take 5 tablets (100 mg total) by mouth daily.  150 tablet  3   No current facility-administered medications for this encounter.     PHYSICAL EXAM: Filed Vitals:   12/19/12 1348  BP: 108/54  Pulse: 60  Weight: 223 lb 8 oz (101.379 kg)  SpO2: 92%    General:  Chronically ill appearing. No resp difficulty HEENT: normal Neck: supple. JVP appears flat  Carotids 2+ bilaterally; no bruits. No lymphadenopathy or thryomegaly appreciated. Cor: PMI normal. Regular rate & rhythm. No rubs, gallops. 1/6 SEM RUSB Lungs: clear  Abdomen: obese, soft, nontender, nondistended. No hepatosplenomegaly. No bruits or masses. Good bowel sounds. Extremities: no cyanosis, clubbing, rash, tr edema with erythema no open wounds (not wrapped today) Neuro: alert & orientedx3, cranial nerves grossly intact. Moves all 4 extremities w/o difficulty. Affect pleasant.    ASSESSMENT & PLAN:

## 2012-12-19 NOTE — Patient Instructions (Addendum)
Saline nasal spray and allergy medicine over the counter.  Labs today  Follow up 2 months.

## 2012-12-20 ENCOUNTER — Telehealth (HOSPITAL_COMMUNITY): Payer: Self-pay | Admitting: Cardiology

## 2012-12-20 ENCOUNTER — Telehealth (HOSPITAL_COMMUNITY): Payer: Self-pay | Admitting: *Deleted

## 2012-12-20 DIAGNOSIS — J302 Other seasonal allergic rhinitis: Secondary | ICD-10-CM | POA: Insufficient documentation

## 2012-12-20 MED ORDER — TORSEMIDE 20 MG PO TABS: 80.0000 mg | ORAL_TABLET | Freq: Every day | ORAL | Status: DC

## 2012-12-20 NOTE — Assessment & Plan Note (Signed)
Volume status well controlled with new diet and weight loss.  Will check labs today.  Will plan to cut back torsemide 80 mg daily.  Have encouraged him to keep up the good work.

## 2012-12-20 NOTE — Telephone Encounter (Signed)
Pt will need Rx for ted hose sent to [facility](ph # [phone] fax # [phone]) as they can order them for the pt. Please be sure to include the correct size and length(knee,thigh,full leg). Darl Pikes will size the pt if someone can call her with instructions on how this should be done.

## 2012-12-20 NOTE — Telephone Encounter (Signed)
Message copied by Noralee Space on Wed Dec 20, 2012  5:07 PM ------      Message from: Hadassah Pais      Created: Tue Dec 19, 2012  4:11 PM       Cr looks great.  BUN elevated will need to cut back torsemide to 4 tabs daily (80 mg). ------

## 2012-12-20 NOTE — Telephone Encounter (Signed)
Compression hose order form faxed to pharmacy

## 2012-12-20 NOTE — Assessment & Plan Note (Signed)
He has recently been discharged from PT/OT and is getting around well with his walker.  He is going to continue to complete exercises at home and start walking daily.

## 2012-12-20 NOTE — Assessment & Plan Note (Signed)
Head congestion and post nasal drip most likely related to seasonal allergies.  He will pick up OTC allergy medication and saline nasal spray to see if this helps his symptoms. If he still has problems he will contact his PCP.

## 2012-12-25 ENCOUNTER — Inpatient Hospital Stay (HOSPITAL_COMMUNITY)
Admission: EM | Admit: 2012-12-25 | Discharge: 2013-01-01 | DRG: 389 | Disposition: A | Discharge status: Home / Self Care | Payer: Medicare Other | Attending: General Surgery | Admitting: General Surgery

## 2012-12-25 ENCOUNTER — Encounter (HOSPITAL_COMMUNITY): Payer: Self-pay | Admitting: Emergency Medicine

## 2012-12-25 ENCOUNTER — Emergency Department (HOSPITAL_COMMUNITY): Payer: Medicare Other

## 2012-12-25 DIAGNOSIS — K56609 Unspecified intestinal obstruction, unspecified as to partial versus complete obstruction: Principal | ICD-10-CM | POA: Diagnosis present

## 2012-12-25 DIAGNOSIS — Y92009 Unspecified place in unspecified non-institutional (private) residence as the place of occurrence of the external cause: Secondary | ICD-10-CM | POA: Diagnosis present

## 2012-12-25 DIAGNOSIS — G8929 Other chronic pain: Secondary | ICD-10-CM | POA: Diagnosis present

## 2012-12-25 DIAGNOSIS — Z6833 Body mass index (BMI) 33.0-33.9, adult: Secondary | ICD-10-CM

## 2012-12-25 DIAGNOSIS — I10 Essential (primary) hypertension: Secondary | ICD-10-CM | POA: Diagnosis present

## 2012-12-25 DIAGNOSIS — I509 Heart failure, unspecified: Secondary | ICD-10-CM | POA: Diagnosis present

## 2012-12-25 DIAGNOSIS — E039 Hypothyroidism, unspecified: Secondary | ICD-10-CM | POA: Diagnosis present

## 2012-12-25 DIAGNOSIS — B965 Pseudomonas (aeruginosa) (mallei) (pseudomallei) as the cause of diseases classified elsewhere: Secondary | ICD-10-CM | POA: Diagnosis present

## 2012-12-25 DIAGNOSIS — J9610 Chronic respiratory failure, unspecified whether with hypoxia or hypercapnia: Secondary | ICD-10-CM | POA: Diagnosis present

## 2012-12-25 DIAGNOSIS — S300XXA Contusion of lower back and pelvis, initial encounter: Secondary | ICD-10-CM | POA: Diagnosis present

## 2012-12-25 DIAGNOSIS — K5909 Other constipation: Secondary | ICD-10-CM | POA: Diagnosis present

## 2012-12-25 DIAGNOSIS — G4733 Obstructive sleep apnea (adult) (pediatric): Secondary | ICD-10-CM | POA: Diagnosis present

## 2012-12-25 DIAGNOSIS — R634 Abnormal weight loss: Secondary | ICD-10-CM | POA: Diagnosis present

## 2012-12-25 DIAGNOSIS — F1210 Cannabis abuse, uncomplicated: Secondary | ICD-10-CM | POA: Diagnosis present

## 2012-12-25 DIAGNOSIS — E785 Hyperlipidemia, unspecified: Secondary | ICD-10-CM | POA: Diagnosis present

## 2012-12-25 DIAGNOSIS — Z794 Long term (current) use of insulin: Secondary | ICD-10-CM

## 2012-12-25 DIAGNOSIS — I5033 Acute on chronic diastolic (congestive) heart failure: Secondary | ICD-10-CM

## 2012-12-25 DIAGNOSIS — Z9981 Dependence on supplemental oxygen: Secondary | ICD-10-CM

## 2012-12-25 DIAGNOSIS — Z9181 History of falling: Secondary | ICD-10-CM

## 2012-12-25 DIAGNOSIS — J9601 Acute respiratory failure with hypoxia: Secondary | ICD-10-CM

## 2012-12-25 DIAGNOSIS — J449 Chronic obstructive pulmonary disease, unspecified: Secondary | ICD-10-CM

## 2012-12-25 DIAGNOSIS — J4489 Other specified chronic obstructive pulmonary disease: Secondary | ICD-10-CM | POA: Diagnosis present

## 2012-12-25 DIAGNOSIS — R5381 Other malaise: Secondary | ICD-10-CM | POA: Diagnosis present

## 2012-12-25 DIAGNOSIS — Z7982 Long term (current) use of aspirin: Secondary | ICD-10-CM

## 2012-12-25 DIAGNOSIS — I5032 Chronic diastolic (congestive) heart failure: Secondary | ICD-10-CM | POA: Diagnosis present

## 2012-12-25 DIAGNOSIS — W010XXA Fall on same level from slipping, tripping and stumbling without subsequent striking against object, initial encounter: Secondary | ICD-10-CM | POA: Diagnosis present

## 2012-12-25 DIAGNOSIS — F1010 Alcohol abuse, uncomplicated: Secondary | ICD-10-CM | POA: Diagnosis present

## 2012-12-25 DIAGNOSIS — N390 Urinary tract infection, site not specified: Secondary | ICD-10-CM | POA: Diagnosis present

## 2012-12-25 DIAGNOSIS — N179 Acute kidney failure, unspecified: Secondary | ICD-10-CM | POA: Diagnosis present

## 2012-12-25 DIAGNOSIS — Z87891 Personal history of nicotine dependence: Secondary | ICD-10-CM

## 2012-12-25 DIAGNOSIS — I4891 Unspecified atrial fibrillation: Secondary | ICD-10-CM

## 2012-12-25 DIAGNOSIS — Z823 Family history of stroke: Secondary | ICD-10-CM

## 2012-12-25 DIAGNOSIS — I2510 Atherosclerotic heart disease of native coronary artery without angina pectoris: Secondary | ICD-10-CM | POA: Diagnosis present

## 2012-12-25 DIAGNOSIS — Z951 Presence of aortocoronary bypass graft: Secondary | ICD-10-CM

## 2012-12-25 DIAGNOSIS — T4275XA Adverse effect of unspecified antiepileptic and sedative-hypnotic drugs, initial encounter: Secondary | ICD-10-CM | POA: Diagnosis present

## 2012-12-25 DIAGNOSIS — E876 Hypokalemia: Secondary | ICD-10-CM | POA: Diagnosis present

## 2012-12-25 DIAGNOSIS — E119 Type 2 diabetes mellitus without complications: Secondary | ICD-10-CM | POA: Diagnosis present

## 2012-12-25 LAB — CBC WITH DIFFERENTIAL/PLATELET
Eosinophils Relative: 0 % (ref 0–5)
HCT: 40 % (ref 39.0–52.0)
Hemoglobin: 12.6 g/dL — ABNORMAL LOW (ref 13.0–17.0)
Lymphocytes Relative: 10 % — ABNORMAL LOW (ref 12–46)
MCV: 81.8 fL (ref 78.0–100.0)
Monocytes Absolute: 0.6 10*3/uL (ref 0.1–1.0)
Monocytes Relative: 6 % (ref 3–12)
Neutro Abs: 7.1 10*3/uL (ref 1.7–7.7)
WBC: 8.5 10*3/uL (ref 4.0–10.5)

## 2012-12-25 LAB — URINALYSIS, ROUTINE W REFLEX MICROSCOPIC
Nitrite: NEGATIVE
Protein, ur: >300 mg/dL — AB
Urobilinogen, UA: 0.2 mg/dL (ref 0.0–1.0)

## 2012-12-25 LAB — COMPREHENSIVE METABOLIC PANEL
BUN: 57 mg/dL — ABNORMAL HIGH (ref 6–23)
CO2: 32 mEq/L (ref 19–32)
Calcium: 10.5 mg/dL (ref 8.4–10.5)
Chloride: 96 mEq/L (ref 96–112)
Creatinine, Ser: 1 mg/dL (ref 0.50–1.35)
GFR calc Af Amer: 87 mL/min — ABNORMAL LOW (ref >90)
GFR calc non Af Amer: 75 mL/min — ABNORMAL LOW (ref >90)
Glucose, Bld: 204 mg/dL — ABNORMAL HIGH (ref 70–99)
Total Bilirubin: 1 mg/dL (ref 0.3–1.2)

## 2012-12-25 LAB — URINE MICROSCOPIC-ADD ON

## 2012-12-25 LAB — LIPASE, BLOOD: Lipase: 41 U/L (ref 11–59)

## 2012-12-25 MED ORDER — MORPHINE SULFATE 4 MG/ML IJ SOLN
4.0000 mg | Freq: Once | INTRAMUSCULAR | Status: AC
  Administered 2012-12-25: 4 mg via INTRAVENOUS
  Filled 2012-12-25: qty 1

## 2012-12-25 MED ORDER — SODIUM CHLORIDE 0.9 % IV SOLN
INTRAVENOUS | Status: DC
  Administered 2012-12-25 (×2): via INTRAVENOUS

## 2012-12-25 MED ORDER — ONDANSETRON HCL 4 MG/2ML IJ SOLN
4.0000 mg | Freq: Once | INTRAMUSCULAR | Status: AC
  Administered 2012-12-25: 4 mg via INTRAVENOUS
  Filled 2012-12-25: qty 2

## 2012-12-25 MED ORDER — IOHEXOL 300 MG/ML  SOLN
25.0000 mL | INTRAMUSCULAR | Status: AC
  Administered 2012-12-25 (×2): 25 mL via ORAL

## 2012-12-25 MED ORDER — IOHEXOL 300 MG/ML  SOLN
100.0000 mL | Freq: Once | INTRAMUSCULAR | Status: AC | PRN
  Administered 2012-12-25: 80 mL via INTRAVENOUS

## 2012-12-25 NOTE — ED Provider Notes (Signed)
Medical screening examination/treatment/procedure(s) were performed by non-physician practitioner and as supervising physician I was immediately available for consultation/collaboration.   Loren Racer, MD 12/25/12 2303

## 2012-12-25 NOTE — ED Notes (Signed)
Pt c/o lower abd pain x 2 days with vomiting x 1; pt sts hx of constipation and unsure if could be from that

## 2012-12-25 NOTE — ED Notes (Signed)
Daughter is Leaving to go home. Daughter wold like to be updated on status. Pt requested.

## 2012-12-25 NOTE — ED Provider Notes (Signed)
History     CSN: [id number]  Arrival date & time 12/25/12  1201   First MD Initiated Contact with Patient 12/25/12 1402      Chief Complaint  Patient presents with  . Abdominal Pain    (Consider location/radiation/quality/duration/timing/severity/associated sxs/prior treatment) HPI Comments: Pt with a PMH significant for HTN, HLD, CAD, CHF, DM presents to the ED with cramping periumbilical abdominal pain x 2 days.  Daughter states he has been complaining of this pain recently but seems to have increased dramatically over the past few days.  Now has new onset vomiting.  Denies any chest pain, SOB, nausea, fever, chills, or dysuria.  Hx of constipation due to opiates.  Uses daily lactulose but has recently been switching to Pepto Bismol.  Has not taken any medications today.  Small BM upon arrival to hospital.  Patient is a 67 y.o. male presenting with abdominal pain. The history is provided by the patient and a relative.  Abdominal Pain Associated symptoms: vomiting     Past Medical History  Diagnosis Date  . CHRONIC OBSTRUCTIVE PULMONARY DISEASE 06/20/2009  . OBSTRUCTIVE SLEEP APNEA 06/20/2009  . CAROTID STENOSIS 06/20/2009    A. 08/2001 s/p L CEA;  B.   09/14/11 - Carotid U/S - 40-59% bilateral stenosis, left CEA patch angioplasty is patent  . DM 06/20/2009  . CAD 06/20/2009    A.  08/2000 - s/p CABG x 4 - LIMA-LAD, Left Radial-OM, VG-DIAG, VG-RCA;  B. Neg. MV  2010  . HYPERLIPIDEMIA 06/20/2009  . HYPERTENSION 06/20/2009  . Hypothyroidism   . Low back pain   . Asthma     as child  . Pneumonia   . Atrial fibrillation     Not felt to be coumadin candidate 2/2 ETOH use.  Marland Kitchen Hypothyroidism   . ETOH abuse   . History of tobacco abuse     remote - quit 1970  . Bilateral renal cysts   . Marijuana abuse   . Morbidly obese   . CAD (coronary artery disease)   . CHF (congestive heart failure)   . Falls frequently     Past Surgical History  Procedure Laterality Date  . Carotid  endarterectomy  2002    left  . Coronary artery bypass graft      x 4 - 2001    Family History  Problem Relation Age of Onset  . Stroke Mother     ?  . Stroke Father     ?    History  Substance Use Topics  . Smoking status: Former Smoker    Quit date: 10/11/1968  . Smokeless tobacco: Not on file  . Alcohol Use: Yes     Comment: wild [place] daily;  also reports regularly drinking a pint of vodka.      Review of Systems  Gastrointestinal: Positive for vomiting and abdominal pain.  All other systems reviewed and are negative.    Allergies  Erythromycin  Home Medications   Current Outpatient Rx  Name  Route  Sig  Dispense  Refill  . aspirin 325 MG tablet   Oral   Take 325 mg by mouth daily.           . carvedilol (COREG) 3.125 MG tablet      TAKE 1 TABLET TWICE A DAY WITH MEAL *ADD TO CARVEDILOL 6.25 TO EQUAL DOSE OF 9.375   60 tablet   1   . carvedilol (COREG) 6.25 MG tablet   Oral   Take  1 tablet (6.25 mg total) by mouth 2 (two) times daily with a meal.   60 tablet   6   . FLUoxetine (PROZAC) 20 MG capsule   Oral   Take 20 mg by mouth daily.           . fluticasone (FLOVENT HFA) 220 MCG/ACT inhaler   Inhalation   Inhale 1 puff into the lungs 2 (two) times daily.           . folic acid (FOLVITE) 1 MG tablet   Oral   Take 1 tablet (1 mg total) by mouth daily.         Marland Kitchen gabapentin (NEURONTIN) 300 MG capsule   Oral   Take 1 capsule (300 mg total) by mouth daily.         . insulin glargine (LANTUS) 100 UNIT/ML injection   Subcutaneous   Inject 10 Units into the skin at bedtime.   10 mL   1   . lactulose (CHRONULAC) 10 GM/15ML solution   Oral   Take 45 mLs (30 g total) by mouth daily.   240 mL      . levothyroxine (SYNTHROID, LEVOTHROID) 150 MCG tablet   Oral   Take 150 mcg by mouth daily.           . Multiple Vitamin (MULTIVITAMIN) capsule   Oral   Take 1 capsule by mouth daily.         Marland Kitchen omeprazole (PRILOSEC) 20 MG  capsule   Oral   Take 20 mg by mouth daily.           Marland Kitchen oxyCODONE-acetaminophen (PERCOCET/ROXICET) 5-325 MG per tablet   Oral   Take 1-2 tablets by mouth every 6 (six) hours as needed.   30 tablet      . polyethylene glycol (MIRALAX / GLYCOLAX) packet   Oral   Take 17 g by mouth daily as needed.   14 each      . senna (SENOKOT) 8.6 MG tablet   Oral   Take 1 tablet by mouth daily. As needed         . simvastatin (ZOCOR) 40 MG tablet   Oral   Take 40 mg by mouth at bedtime.           . sodium chloride (OCEAN) 0.65 % SOLN nasal spray   Nasal   Place 1 spray into the nose as needed for congestion.         Marland Kitchen spironolactone (ALDACTONE) 25 MG tablet   Oral   Take 1 tablet (25 mg total) by mouth daily.   30 tablet   6   . torsemide (DEMADEX) 20 MG tablet   Oral   Take 4 tablets (80 mg total) by mouth daily.   150 tablet   3     BP 169/87  Pulse 82  Temp(Src) 97.8 F (36.6 C) (Oral)  Resp 20  SpO2 96%  Physical Exam  Nursing note and vitals reviewed. Constitutional: He is oriented to person, place, and time. He appears well-developed and well-nourished.  HENT:  Head: Normocephalic and atraumatic.  Mouth/Throat: Oropharynx is clear and moist.  Eyes: Conjunctivae and EOM are normal. Pupils are equal, round, and reactive to light.  Neck: Normal range of motion.  Cardiovascular: Normal rate, regular rhythm and normal heart sounds.   Pulmonary/Chest: Effort normal and breath sounds normal.  Abdominal: Soft. He exhibits distension. Bowel sounds are decreased. There is tenderness in the periumbilical area. There is rigidity.  There is no CVA tenderness.  Musculoskeletal: Normal range of motion. He exhibits edema (2+ BLE).  Neurological: He is alert and oriented to person, place, and time. He has normal strength.  CN grossly intact, normal sensation in BLE  Skin: Skin is warm and dry.  Psychiatric: He has a normal mood and affect. His speech is normal.    ED  Course  Procedures (including critical care time)  Labs Reviewed  CBC WITH DIFFERENTIAL - Abnormal; Notable for the following:    Hemoglobin 12.6 (*)    MCH 25.8 (*)    RDW 19.5 (*)    Platelets 125 (*)    Neutrophils Relative 83 (*)    Lymphocytes Relative 10 (*)    All other components within normal limits  COMPREHENSIVE METABOLIC PANEL - Abnormal; Notable for the following:    Glucose, Bld 204 (*)    BUN 57 (*)    Total Protein 9.1 (*)    GFR calc non Af Amer 75 (*)    GFR calc Af Amer 87 (*)    All other components within normal limits  URINALYSIS, ROUTINE W REFLEX MICROSCOPIC - Abnormal; Notable for the following:    Hgb urine dipstick TRACE (*)    Protein, ur >300 (*)    All other components within normal limits  LIPASE, BLOOD  TROPONIN I  LACTIC ACID, PLASMA  URINE MICROSCOPIC-ADD ON   Ct Abdomen Pelvis W Contrast  12/25/2012  *RADIOLOGY REPORT*  Clinical Data: Lower abdominal pain for 2 days with vomiting. Hypertension with hyperlipidemia.  CT ABDOMEN AND PELVIS WITH CONTRAST  Technique:  Multidetector CT imaging of the abdomen and pelvis was performed following the standard protocol during bolus administration of intravenous contrast.  Contrast: 80mL OMNIPAQUE IOHEXOL 300 MG/ML  SOLN  Comparison: 09/07/2012.  Findings: Fluid filled dilated loops of small bowel to the level of the proximal ileum.  At this level there is a loop of small bowel which has contents which appear fecal like (can be seen with malabsorption syndrome) at which point there is an abrupt change caliber of small bowel.  Cause is indeterminate as a discrete hernia is not identified.  It is possible this represents small bowel obstruction from adhesions.  Fluid in the abdomen partially surrounds portions of bowel limiting evaluation for possibility of colitis and / or diverticulitis. There are scattered diverticula throughout the left colon and sigmoid colon.  My suspicion is that the fluid is related to the  small bowel abnormality or cirrhosis.  Bilateral renal lesions most of which appear to be cysts.  Some smaller lesions are too small to adequately characterize.  No hydronephrosis.  No worrisome focal splenic, adrenal or pancreatic lesion.  Kyphosis throughout the lumbar junction with degenerative changes without worrisome bony destructive lesion.  Subcutaneous lesions have improved or remain the same and may be related to patient's prior trauma as per prior CT report.  Noncontrast filled views of the urinary bladder unremarkable.  IMPRESSION: Findings raise possibility of small bowel obstruction.  Cause is indeterminate.  Colonic diverticula.  Given the presence of ascites, it is difficult to exclude or evaluate for the possibility of diverticulitis/colitis however, my suspicion is that fluid is related to small bowel abnormality or cirrhosis rather than primary colonic abnormality.  Please see above.   Original Report Authenticated By: Lacy Duverney, M.D.    Dg Abd Acute W/chest  12/25/2012  *RADIOLOGY REPORT*  Clinical Data: Abdominal pain.  Nausea and vomiting.  ACUTE ABDOMEN SERIES (ABDOMEN  2 VIEW & CHEST 1 VIEW)  Comparison: Chest radiograph on 09/03/2012  Findings: A few mildly dilated small bowel loops are seen in the central abdomen which have air fluid levels.  Some gas and stool is seen throughout majority the colon.  A partial small bowel obstruction cannot be excluded.  There is no evidence of free air. Vascular calcification is noted, but no radiopaque calculi identified.  Mild cardiomegaly is stable.  Low lung volumes again noted.  No evidence of acute infiltrate or edema.  No evidence of pleural effusion.  Prior CABG again noted.  IMPRESSION:  1.  Mildly dilated small bowel loops.  Partial small bowel obstruction cannot be excluded. 2.  Cardiomegaly low lung volumes.  No active lung disease.   Original Report Authenticated By: Myles Rosenthal, M.D.      No diagnosis found.    MDM   Pt with a  PMH significant for HTN, HLD, CAD, CHF, DM presents to the ED with cramping periumbilical abdominal pain x 2 days.  Hx of constipation secondary to chronic narcotic pain medication.  Takes daily lactulose but recently has been taking pepto bismol instead.  Acute abd series could not r/o SBO.  CT abd/pelvis pending at this time.  Possible surgical admit.  Signed out to Marlon Pel PA-C for disposition.         Garlon Hatchet, PA-C 12/25/12 2120

## 2012-12-25 NOTE — ED Notes (Signed)
Pt did not answer x 1 

## 2012-12-25 NOTE — ED Provider Notes (Signed)
Pt hand off to me by Dr. Manus Gunning and Sharilyn Sites, PA-C. Awaiting abdominal CT scan to ro SBO.  Results for orders placed during the hospital encounter of 12/25/12  CBC WITH DIFFERENTIAL      Result Value Range   WBC 8.5  4.0 - 10.5 K/uL   RBC 4.89  4.22 - 5.81 MIL/uL   Hemoglobin 12.6 (*) 13.0 - 17.0 g/dL   HCT 16.1  09.6 - 04.5 %   MCV 81.8  78.0 - 100.0 fL   MCH 25.8 (*) 26.0 - 34.0 pg   MCHC 31.5  30.0 - 36.0 g/dL   RDW 40.9 (*) 81.1 - 91.4 %   Platelets 125 (*) 150 - 400 K/uL   Neutrophils Relative 83 (*) 43 - 77 %   Neutro Abs 7.1  1.7 - 7.7 K/uL   Lymphocytes Relative 10 (*) 12 - 46 %   Lymphs Abs 0.8  0.7 - 4.0 K/uL   Monocytes Relative 6  3 - 12 %   Monocytes Absolute 0.6  0.1 - 1.0 K/uL   Eosinophils Relative 0  0 - 5 %   Eosinophils Absolute 0.0  0.0 - 0.7 K/uL   Basophils Relative 0  0 - 1 %   Basophils Absolute 0.0  0.0 - 0.1 K/uL  COMPREHENSIVE METABOLIC PANEL      Result Value Range   Sodium 139  135 - 145 mEq/L   Potassium 4.1  3.5 - 5.1 mEq/L   Chloride 96  96 - 112 mEq/L   CO2 32  19 - 32 mEq/L   Glucose, Bld 204 (*) 70 - 99 mg/dL   BUN 57 (*) 6 - 23 mg/dL   Creatinine, Ser 7.82  0.50 - 1.35 mg/dL   Calcium 95.6  8.4 - 21.3 mg/dL   Total Protein 9.1 (*) 6.0 - 8.3 g/dL   Albumin 4.2  3.5 - 5.2 g/dL   AST 20  0 - 37 U/L   ALT 11  0 - 53 U/L   Alkaline Phosphatase 88  39 - 117 U/L   Total Bilirubin 1.0  0.3 - 1.2 mg/dL   GFR calc non Af Amer 75 (*) >90 mL/min   GFR calc Af Amer 87 (*) >90 mL/min  URINALYSIS, ROUTINE W REFLEX MICROSCOPIC      Result Value Range   Color, Urine YELLOW  YELLOW   APPearance CLEAR  CLEAR   Specific Gravity, Urine 1.012  1.005 - 1.030   pH 7.0  5.0 - 8.0   Glucose, UA NEGATIVE  NEGATIVE mg/dL   Hgb urine dipstick TRACE (*) NEGATIVE   Bilirubin Urine NEGATIVE  NEGATIVE   Ketones, ur NEGATIVE  NEGATIVE mg/dL   Protein, ur >086 (*) NEGATIVE mg/dL   Urobilinogen, UA 0.2  0.0 - 1.0 mg/dL   Nitrite NEGATIVE  NEGATIVE    Leukocytes, UA NEGATIVE  NEGATIVE  LIPASE, BLOOD      Result Value Range   Lipase 41  11 - 59 U/L  TROPONIN I      Result Value Range   Troponin I <0.30  <0.30 ng/mL  LACTIC ACID, PLASMA      Result Value Range   Lactic Acid, Venous 0.9  0.5 - 2.2 mmol/L  URINE MICROSCOPIC-ADD ON      Result Value Range   Squamous Epithelial / LPF RARE  RARE   WBC, UA 0-2  <3 WBC/hpf   RBC / HPF 3-6  <3 RBC/hpf   Bacteria, UA  RARE  RARE   Ct Abdomen Pelvis W Contrast  12/25/2012  *RADIOLOGY REPORT*  Clinical Data: Lower abdominal pain for 2 days with vomiting. Hypertension with hyperlipidemia.  CT ABDOMEN AND PELVIS WITH CONTRAST  Technique:  Multidetector CT imaging of the abdomen and pelvis was performed following the standard protocol during bolus administration of intravenous contrast.  Contrast: 80mL OMNIPAQUE IOHEXOL 300 MG/ML  SOLN  Comparison: 09/07/2012.  Findings: Fluid filled dilated loops of small bowel to the level of the proximal ileum.  At this level there is a loop of small bowel which has contents which appear fecal like (can be seen with malabsorption syndrome) at which point there is an abrupt change caliber of small bowel.  Cause is indeterminate as a discrete hernia is not identified.  It is possible this represents small bowel obstruction from adhesions.  Fluid in the abdomen partially surrounds portions of bowel limiting evaluation for possibility of colitis and / or diverticulitis. There are scattered diverticula throughout the left colon and sigmoid colon.  My suspicion is that the fluid is related to the small bowel abnormality or cirrhosis.  Bilateral renal lesions most of which appear to be cysts.  Some smaller lesions are too small to adequately characterize.  No hydronephrosis.  No worrisome focal splenic, adrenal or pancreatic lesion.  Kyphosis throughout the lumbar junction with degenerative changes without worrisome bony destructive lesion.  Subcutaneous lesions have improved or  remain the same and may be related to patient's prior trauma as per prior CT report.  Noncontrast filled views of the urinary bladder unremarkable.  IMPRESSION: Findings raise possibility of small bowel obstruction.  Cause is indeterminate.  Colonic diverticula.  Given the presence of ascites, it is difficult to exclude or evaluate for the possibility of diverticulitis/colitis however, my suspicion is that fluid is related to small bowel abnormality or cirrhosis rather than primary colonic abnormality.  Please see above.   Original Report Authenticated By: Lacy Duverney, M.D.    Dg Abd Acute W/chest  12/25/2012  *RADIOLOGY REPORT*  Clinical Data: Abdominal pain.  Nausea and vomiting.  ACUTE ABDOMEN SERIES (ABDOMEN 2 VIEW & CHEST 1 VIEW)  Comparison: Chest radiograph on 09/03/2012  Findings: A few mildly dilated small bowel loops are seen in the central abdomen which have air fluid levels.  Some gas and stool is seen throughout majority the colon.  A partial small bowel obstruction cannot be excluded.  There is no evidence of free air. Vascular calcification is noted, but no radiopaque calculi identified.  Mild cardiomegaly is stable.  Low lung volumes again noted.  No evidence of acute infiltrate or edema.  No evidence of pleural effusion.  Prior CABG again noted.  IMPRESSION:  1.  Mildly dilated small bowel loops.  Partial small bowel obstruction cannot be excluded. 2.  Cardiomegaly low lung volumes.  No active lung disease.   Original Report Authenticated By: Myles Rosenthal, M.D.    CT scan is inconclusive of SBO. Pt continues to have pain. Admits to passing a small amount of stool. Unsure of whether he has had any "pootin" or not. His pain is periumbilical he  Has risk factors  Of narcotic usage for which he takes lactulose.  Dr. Ranae Palms has discussed case with GenSurg who is coming to see patient for further evaluation.  I have discussed findings and plan with family who voice their  understanding.   Dorthula Matas, PA-C 12/25/12 1904

## 2012-12-26 ENCOUNTER — Encounter (HOSPITAL_COMMUNITY): Payer: Self-pay | Admitting: *Deleted

## 2012-12-26 ENCOUNTER — Inpatient Hospital Stay (HOSPITAL_COMMUNITY): Payer: Medicare Other

## 2012-12-26 DIAGNOSIS — R112 Nausea with vomiting, unspecified: Secondary | ICD-10-CM

## 2012-12-26 DIAGNOSIS — R109 Unspecified abdominal pain: Secondary | ICD-10-CM

## 2012-12-26 LAB — GLUCOSE, CAPILLARY
Glucose-Capillary: 143 mg/dL — ABNORMAL HIGH (ref 70–99)
Glucose-Capillary: 166 mg/dL — ABNORMAL HIGH (ref 70–99)
Glucose-Capillary: 158 mg/dL — ABNORMAL HIGH (ref 70–99)

## 2012-12-26 LAB — BASIC METABOLIC PANEL
Calcium: 10.3 mg/dL (ref 8.4–10.5)
Creatinine, Ser: 1.01 mg/dL (ref 0.50–1.35)
GFR calc non Af Amer: 74 mL/min — ABNORMAL LOW (ref >90)
Sodium: 142 mEq/L (ref 135–145)

## 2012-12-26 MED ORDER — POTASSIUM CHLORIDE IN NACL 20-0.9 MEQ/L-% IV SOLN
INTRAVENOUS | Status: DC
  Administered 2012-12-26: 04:00:00 via INTRAVENOUS
  Administered 2012-12-26: 1000 mL via INTRAVENOUS
  Administered 2012-12-27: 01:00:00 via INTRAVENOUS
  Administered 2012-12-28 (×2): 100 mL/h via INTRAVENOUS
  Administered 2012-12-28: 09:00:00 via INTRAVENOUS
  Filled 2012-12-26 (×13): qty 1000

## 2012-12-26 MED ORDER — FLUTICASONE PROPIONATE HFA 220 MCG/ACT IN AERO
1.0000 | INHALATION_SPRAY | Freq: Two times a day (BID) | RESPIRATORY_TRACT | Status: DC | PRN
Start: 2012-12-26 — End: 2013-01-01
  Filled 2012-12-26: qty 12

## 2012-12-26 MED ORDER — LEVOTHYROXINE SODIUM 100 MCG IV SOLR
75.0000 ug | Freq: Every day | INTRAVENOUS | Status: DC
  Administered 2012-12-26 – 2012-12-29 (×4): 75 ug via INTRAVENOUS
  Filled 2012-12-26 (×5): qty 5

## 2012-12-26 MED ORDER — MORPHINE SULFATE 4 MG/ML IJ SOLN
4.0000 mg | Freq: Once | INTRAMUSCULAR | Status: AC
  Administered 2012-12-26: 4 mg via INTRAVENOUS
  Filled 2012-12-26: qty 1

## 2012-12-26 MED ORDER — BIOTENE DRY MOUTH MT LIQD
15.0000 mL | Freq: Two times a day (BID) | OROMUCOSAL | Status: DC
  Administered 2012-12-27 – 2012-12-31 (×9): 15 mL via OROMUCOSAL

## 2012-12-26 MED ORDER — MORPHINE SULFATE 2 MG/ML IJ SOLN
2.0000 mg | INTRAMUSCULAR | Status: DC | PRN
  Administered 2012-12-26 (×3): 4 mg via INTRAVENOUS
  Administered 2012-12-26: 6 mg via INTRAVENOUS
  Administered 2012-12-26 (×4): 4 mg via INTRAVENOUS
  Administered 2012-12-26: 6 mg via INTRAVENOUS
  Administered 2012-12-26 – 2012-12-27 (×3): 4 mg via INTRAVENOUS
  Administered 2012-12-27: 2 mg via INTRAVENOUS
  Administered 2012-12-27 – 2012-12-30 (×5): 4 mg via INTRAVENOUS
  Administered 2012-12-30 – 2013-01-01 (×6): 2 mg via INTRAVENOUS
  Filled 2012-12-26: qty 2
  Filled 2012-12-26 (×2): qty 1
  Filled 2012-12-26: qty 3
  Filled 2012-12-26 (×2): qty 2
  Filled 2012-12-26 (×2): qty 1
  Filled 2012-12-26: qty 2
  Filled 2012-12-26: qty 1
  Filled 2012-12-26 (×2): qty 2
  Filled 2012-12-26 (×4): qty 1
  Filled 2012-12-26 (×2): qty 2
  Filled 2012-12-26 (×2): qty 1
  Filled 2012-12-26 (×3): qty 2
  Filled 2012-12-26: qty 1
  Filled 2012-12-26: qty 2
  Filled 2012-12-26: qty 1
  Filled 2012-12-26: qty 2
  Filled 2012-12-26: qty 1
  Filled 2012-12-26: qty 2

## 2012-12-26 MED ORDER — INSULIN ASPART 100 UNIT/ML ~~LOC~~ SOLN
0.0000 [IU] | SUBCUTANEOUS | Status: DC
  Administered 2012-12-26 (×2): 3 [IU] via SUBCUTANEOUS
  Administered 2012-12-27: 2 [IU] via SUBCUTANEOUS
  Administered 2012-12-29 – 2012-12-30 (×3): 3 [IU] via SUBCUTANEOUS
  Administered 2012-12-31: 20:00:00 via SUBCUTANEOUS
  Administered 2013-01-01 (×2): 2 [IU] via SUBCUTANEOUS

## 2012-12-26 MED ORDER — PHENOL 1.4 % MT LIQD
1.0000 | OROMUCOSAL | Status: DC | PRN
  Filled 2012-12-26: qty 177

## 2012-12-26 MED ORDER — PANTOPRAZOLE SODIUM 40 MG IV SOLR
40.0000 mg | Freq: Every day | INTRAVENOUS | Status: DC
  Administered 2012-12-26 – 2012-12-30 (×6): 40 mg via INTRAVENOUS
  Filled 2012-12-26 (×8): qty 40

## 2012-12-26 MED ORDER — METOPROLOL TARTRATE 1 MG/ML IV SOLN
5.0000 mg | Freq: Four times a day (QID) | INTRAVENOUS | Status: DC
  Administered 2012-12-26 – 2013-01-01 (×21): 5 mg via INTRAVENOUS
  Filled 2012-12-26 (×30): qty 5

## 2012-12-26 MED ORDER — HEPARIN SODIUM (PORCINE) 5000 UNIT/ML IJ SOLN
5000.0000 [IU] | Freq: Three times a day (TID) | INTRAMUSCULAR | Status: DC
  Administered 2012-12-26 – 2013-01-01 (×18): 5000 [IU] via SUBCUTANEOUS
  Filled 2012-12-26 (×22): qty 1

## 2012-12-26 MED ORDER — ONDANSETRON HCL 4 MG/2ML IJ SOLN
4.0000 mg | Freq: Four times a day (QID) | INTRAMUSCULAR | Status: DC | PRN
  Administered 2012-12-26 (×3): 4 mg via INTRAVENOUS
  Filled 2012-12-26 (×3): qty 2

## 2012-12-26 MED ORDER — CHLORHEXIDINE GLUCONATE 0.12 % MT SOLN
15.0000 mL | Freq: Two times a day (BID) | OROMUCOSAL | Status: DC
  Administered 2012-12-26 – 2012-12-31 (×8): 15 mL via OROMUCOSAL
  Filled 2012-12-26 (×6): qty 15

## 2012-12-26 NOTE — ED Notes (Signed)
Talked with Daughter and gave update on patient status

## 2012-12-26 NOTE — Progress Notes (Signed)
Subjective: Still having nausea and abdominal pain.  Vomited while I was with him  Objective: Vital signs in last 24 hours: Temp:  [97.3 F (36.3 C)-97.8 F (36.6 C)] 97.4 F (36.3 C) (03/18 0546) Pulse Rate:  [74-88] 77 (03/18 0546) Resp:  [18-20] 20 (03/18 0546) BP: (115-169)/(69-90) 141/82 mmHg (03/18 0546) SpO2:  [91 %-100 %] 95 % (03/18 0546) FiO2 (%):  [28 %] 28 % (03/18 0249) Weight:  [232 lb (105.235 kg)] 232 lb (105.235 kg) (03/18 0245) Last BM Date: 12/22/12  Intake/Output from previous day: 03/17 0701 - 03/18 0700 In: 175 [I.V.:175] Out: -  Intake/Output this shift:    General appearance: alert, cooperative and no distress Resp: nonlabored Cardio: normal rate GI: soft, moderate tenderness in mid abdomen, some distension and tympany as well, no peritoneal signs  Lab Results:   Recent Labs  12/25/12 1403  WBC 8.5  HGB 12.6*  HCT 40.0  PLT 125*   BMET  Recent Labs  12/25/12 1403 12/26/12 0445  NA 139 142  K 4.1 4.9  CL 96 99  CO2 32 29  GLUCOSE 204* 171*  BUN 57* 51*  CREATININE 1.00 1.01  CALCIUM 10.5 10.3   PT/INR No results found for this basename: LABPROT, INR,  in the last 72 hours ABG No results found for this basename: PHART, PCO2, PO2, HCO3,  in the last 72 hours  Studies/Results: Ct Abdomen Pelvis W Contrast  12/25/2012  *RADIOLOGY REPORT*  Clinical Data: Lower abdominal pain for 2 days with vomiting. Hypertension with hyperlipidemia.  CT ABDOMEN AND PELVIS WITH CONTRAST  Technique:  Multidetector CT imaging of the abdomen and pelvis was performed following the standard protocol during bolus administration of intravenous contrast.  Contrast: 80mL OMNIPAQUE IOHEXOL 300 MG/ML  SOLN  Comparison: 09/07/2012.  Findings: Fluid filled dilated loops of small bowel to the level of the proximal ileum.  At this level there is a loop of small bowel which has contents which appear fecal like (can be seen with malabsorption syndrome) at which point  there is an abrupt change caliber of small bowel.  Cause is indeterminate as a discrete hernia is not identified.  It is possible this represents small bowel obstruction from adhesions.  Fluid in the abdomen partially surrounds portions of bowel limiting evaluation for possibility of colitis and / or diverticulitis. There are scattered diverticula throughout the left colon and sigmoid colon.  My suspicion is that the fluid is related to the small bowel abnormality or cirrhosis.  Bilateral renal lesions most of which appear to be cysts.  Some smaller lesions are too small to adequately characterize.  No hydronephrosis.  No worrisome focal splenic, adrenal or pancreatic lesion.  Kyphosis throughout the lumbar junction with degenerative changes without worrisome bony destructive lesion.  Subcutaneous lesions have improved or remain the same and may be related to patient's prior trauma as per prior CT report.  Noncontrast filled views of the urinary bladder unremarkable.  IMPRESSION: Findings raise possibility of small bowel obstruction.  Cause is indeterminate.  Colonic diverticula.  Given the presence of ascites, it is difficult to exclude or evaluate for the possibility of diverticulitis/colitis however, my suspicion is that fluid is related to small bowel abnormality or cirrhosis rather than primary colonic abnormality.  Please see above.   Original Report Authenticated By: Lacy Duverney, M.D.    Dg Abd 2 Views  12/26/2012  *RADIOLOGY REPORT*  Clinical Data: SBO  ABDOMEN - 2 VIEW  Comparison: Yesterday  Findings: Dilated  small bowel loops with air-fluid levels and increased in caliber.  Prominent stool burden in the proximal colon.  There is no free intraperitoneal gas on the left decubitus image.  IMPRESSION: Worsening partial small bowel obstruction pattern.  No free intraperitoneal gas.   Original Report Authenticated By: Jolaine Click, M.D.    Dg Abd Acute W/chest  12/25/2012  *RADIOLOGY REPORT*  Clinical  Data: Abdominal pain.  Nausea and vomiting.  ACUTE ABDOMEN SERIES (ABDOMEN 2 VIEW & CHEST 1 VIEW)  Comparison: Chest radiograph on 09/03/2012  Findings: A few mildly dilated small bowel loops are seen in the central abdomen which have air fluid levels.  Some gas and stool is seen throughout majority the colon.  A partial small bowel obstruction cannot be excluded.  There is no evidence of free air. Vascular calcification is noted, but no radiopaque calculi identified.  Mild cardiomegaly is stable.  Low lung volumes again noted.  No evidence of acute infiltrate or edema.  No evidence of pleural effusion.  Prior CABG again noted.  IMPRESSION:  1.  Mildly dilated small bowel loops.  Partial small bowel obstruction cannot be excluded. 2.  Cardiomegaly low lung volumes.  No active lung disease.   Original Report Authenticated By: Myles Rosenthal, M.D.     Anti-infectives: Anti-infectives   None      Assessment/Plan: s/p * No surgery found * NG tube and IV fluids.   I am concerned that he may need surgery for relief of this suspected bowel obstruction especially since he denies any prior abdominal surgery.  I think that at the minimum we trial NG tube decompression and if no improvement, then we will plan for surgical exploration. right now he has no peritonitis and lactate normal.  No evidence of need for emergent exploration.  LOS: 1 day    Lodema Pilot DAVID 12/26/2012

## 2012-12-26 NOTE — ED Provider Notes (Signed)
Medical screening examination/treatment/procedure(s) were conducted as a shared visit with non-physician practitioner(s) and myself.  I personally evaluated the patient during the encounter  crampy periumbilical abdominal pain x 2 days. Some vomiting. Diffuse tenderness to palpation, distended. No peritoneal signs.  Contrary to PA note, no rigidity.  Glynn Octave, MD 12/26/12 1430

## 2012-12-26 NOTE — ED Notes (Signed)
MD at bedside. Consult to general surgery

## 2012-12-26 NOTE — H&P (Signed)
[name] is an 67 y.o. male.   Chief Complaint: abdominal pain, nausea and vomiting HPI: patient is a 67 year old male who presents to the emergency room due to abdominal pain. He states the pain started gradually about 48 hours ago. It came on after he was straining at bowel movement.  The patient has chronic constipation likely secondary to chronic narcotic use for pain and takes MiraLAX daily. After having to strain after a bowel movement he developed the gradual onset of pain in his mid abdomen. He states the pain has gotten steadily worse over the last 2 days. He describes an aching or cramping pain that is constant but there are exacerbations when he gets more severe. He has been nauseated and had one episode of vomiting just before getting to the emergency room today. He describes that as bilious. No melena or hematochezia or hematemesis. The pain is in his midabdomen and does not radiate. He denies any history of any similar symptoms or any chronic or recurring abdominal pain. No fever or chills. No exacerbating or alleviating factors.  Past Medical History  Diagnosis Date  . CHRONIC OBSTRUCTIVE PULMONARY DISEASE 06/20/2009  . OBSTRUCTIVE SLEEP APNEA 06/20/2009  . CAROTID STENOSIS 06/20/2009    A. 08/2001 s/p L CEA;  B.   09/14/11 - Carotid U/S - 40-59% bilateral stenosis, left CEA patch angioplasty is patent  . DM 06/20/2009  . CAD 06/20/2009    A.  08/2000 - s/p CABG x 4 - LIMA-LAD, Left Radial-OM, VG-DIAG, VG-RCA;  B. Neg. MV  2010  . HYPERLIPIDEMIA 06/20/2009  . HYPERTENSION 06/20/2009  . Hypothyroidism   . Low back pain   . Asthma     as child  . Pneumonia   . Atrial fibrillation     Not felt to be coumadin candidate 2/2 ETOH use.  Marland Kitchen Hypothyroidism   . ETOH abuse   . History of tobacco abuse     remote - quit 1970  . Bilateral renal cysts   . Marijuana abuse   . Morbidly obese   . CAD (coronary artery disease)   . CHF (congestive heart failure)   . Falls frequently      Past Surgical History  Procedure Laterality Date  . Carotid endarterectomy  2002    left  . Coronary artery bypass graft      x 4 - 2001   Current Facility-Administered Medications  Medication Dose Route Frequency Provider Last Rate Last Dose  . 0.9 %  sodium chloride infusion   Intravenous Continuous Garlon Hatchet, PA-C       Current Outpatient Prescriptions  Medication Sig Dispense Refill  . aspirin EC 325 MG tablet Take 325 mg by mouth daily.      . carvedilol (COREG) 3.125 MG tablet Take 3.125 mg by mouth 2 (two) times daily with a meal. Take with carvedilol 6.25mg  for a total of 9.375mg       . carvedilol (COREG) 6.25 MG tablet Take 6.25 mg by mouth 2 (two) times daily with a meal. Take with 3.125mg  for a total of 9.375mg       . FLUoxetine (PROZAC) 20 MG capsule Take 20 mg by mouth daily.        . fluticasone (FLOVENT HFA) 220 MCG/ACT inhaler Inhale 1 puff into the lungs 2 (two) times daily as needed (for shortness of breath).       . folic acid (FOLVITE) 1 MG tablet Take 1 tablet (1 mg total) by mouth daily.      Marland Kitchen  gabapentin (NEURONTIN) 300 MG capsule Take 1 capsule (300 mg total) by mouth daily.      . insulin glargine (LANTUS) 100 UNIT/ML injection Inject 10 Units into the skin at bedtime.  10 mL  1  . lactulose (CHRONULAC) 10 GM/15ML solution Take 45 mLs (30 g total) by mouth daily.  240 mL    . levothyroxine (SYNTHROID, LEVOTHROID) 150 MCG tablet Take 150 mcg by mouth daily.       . Multiple Vitamin (MULTIVITAMIN) capsule Take 1 capsule by mouth daily.      Marland Kitchen omeprazole (PRILOSEC) 20 MG capsule Take 20 mg by mouth daily.        Marland Kitchen oxyCODONE (OXY IR/ROXICODONE) 5 MG immediate release tablet Take 5 mg by mouth every 6 (six) hours as needed for pain.      Marland Kitchen senna (SENOKOT) 8.6 MG tablet Take 2 tablets by mouth daily as needed for constipation.       . simvastatin (ZOCOR) 40 MG tablet Take 40 mg by mouth at bedtime.        . sodium chloride (OCEAN) 0.65 % SOLN nasal spray  Place 1 spray into the nose as needed for congestion.      Marland Kitchen spironolactone (ALDACTONE) 25 MG tablet Take 1 tablet (25 mg total) by mouth daily.  30 tablet  6  . torsemide (DEMADEX) 20 MG tablet Take 4 tablets (80 mg total) by mouth daily.  150 tablet  3     Family History  Problem Relation Age of Onset  . Stroke Mother     ?  . Stroke Father     ?   Social History:  reports that he quit smoking about 44 years ago. He does not have any smokeless tobacco history on file. He reports that  drinks alcohol. He reports that he uses illicit drugs (Marijuana).  Allergies:  Allergies  Allergen Reactions  . Erythromycin Hives     (Not in a hospital admission)  Results for orders placed during the hospital encounter of 12/25/12 (from the past 48 hour(s))  CBC WITH DIFFERENTIAL     Status: Abnormal   Collection Time    12/25/12  2:03 PM      Result Value Range   WBC 8.5  4.0 - 10.5 K/uL   RBC 4.89  4.22 - 5.81 MIL/uL   Hemoglobin 12.6 (*) 13.0 - 17.0 g/dL   HCT 30.8  65.7 - 84.6 %   MCV 81.8  78.0 - 100.0 fL   MCH 25.8 (*) 26.0 - 34.0 pg   MCHC 31.5  30.0 - 36.0 g/dL   RDW 96.2 (*) 95.2 - 84.1 %   Platelets 125 (*) 150 - 400 K/uL   Neutrophils Relative 83 (*) 43 - 77 %   Neutro Abs 7.1  1.7 - 7.7 K/uL   Lymphocytes Relative 10 (*) 12 - 46 %   Lymphs Abs 0.8  0.7 - 4.0 K/uL   Monocytes Relative 6  3 - 12 %   Monocytes Absolute 0.6  0.1 - 1.0 K/uL   Eosinophils Relative 0  0 - 5 %   Eosinophils Absolute 0.0  0.0 - 0.7 K/uL   Basophils Relative 0  0 - 1 %   Basophils Absolute 0.0  0.0 - 0.1 K/uL  COMPREHENSIVE METABOLIC PANEL     Status: Abnormal   Collection Time    12/25/12  2:03 PM      Result Value Range   Sodium 139  135 -  145 mEq/L   Potassium 4.1  3.5 - 5.1 mEq/L   Chloride 96  96 - 112 mEq/L   CO2 32  19 - 32 mEq/L   Glucose, Bld 204 (*) 70 - 99 mg/dL   BUN 57 (*) 6 - 23 mg/dL   Creatinine, Ser 2.95  0.50 - 1.35 mg/dL   Calcium 62.1  8.4 - 30.8 mg/dL   Total  Protein 9.1 (*) 6.0 - 8.3 g/dL   Albumin 4.2  3.5 - 5.2 g/dL   AST 20  0 - 37 U/L   ALT 11  0 - 53 U/L   Alkaline Phosphatase 88  39 - 117 U/L   Total Bilirubin 1.0  0.3 - 1.2 mg/dL   GFR calc non Af Amer 75 (*) >90 mL/min   GFR calc Af Amer 87 (*) >90 mL/min   Comment:            The eGFR has been calculated     using the CKD EPI equation.     This calculation has not been     validated in all clinical     situations.     eGFR's persistently     <90 mL/min signify     possible Chronic Kidney Disease.  TROPONIN I     Status: None   Collection Time    12/25/12  2:22 PM      Result Value Range   Troponin I <0.30  <0.30 ng/mL   Comment:            Due to the release kinetics of cTnI,     a negative result within the first hours     of the onset of symptoms does not rule out     myocardial infarction with certainty.     If myocardial infarction is still suspected,     repeat the test at appropriate intervals.  LIPASE, BLOOD     Status: None   Collection Time    12/25/12  2:52 PM      Result Value Range   Lipase 41  11 - 59 U/L  LACTIC ACID, PLASMA     Status: None   Collection Time    12/25/12  2:53 PM      Result Value Range   Lactic Acid, Venous 0.9  0.5 - 2.2 mmol/L  URINALYSIS, ROUTINE W REFLEX MICROSCOPIC     Status: Abnormal   Collection Time    12/25/12  2:54 PM      Result Value Range   Color, Urine YELLOW  YELLOW   APPearance CLEAR  CLEAR   Specific Gravity, Urine 1.012  1.005 - 1.030   pH 7.0  5.0 - 8.0   Glucose, UA NEGATIVE  NEGATIVE mg/dL   Hgb urine dipstick TRACE (*) NEGATIVE   Bilirubin Urine NEGATIVE  NEGATIVE   Ketones, ur NEGATIVE  NEGATIVE mg/dL   Protein, ur >657 (*) NEGATIVE mg/dL   Urobilinogen, UA 0.2  0.0 - 1.0 mg/dL   Nitrite NEGATIVE  NEGATIVE   Leukocytes, UA NEGATIVE  NEGATIVE  URINE MICROSCOPIC-ADD ON     Status: None   Collection Time    12/25/12  2:54 PM      Result Value Range   Squamous Epithelial / LPF RARE  RARE   WBC, UA 0-2   <3 WBC/hpf   RBC / HPF 3-6  <3 RBC/hpf   Bacteria, UA RARE  RARE   Ct Abdomen Pelvis W Contrast  12/25/2012  *  RADIOLOGY REPORT*  Clinical Data: Lower abdominal pain for 2 days with vomiting. Hypertension with hyperlipidemia.  CT ABDOMEN AND PELVIS WITH CONTRAST  Technique:  Multidetector CT imaging of the abdomen and pelvis was performed following the standard protocol during bolus administration of intravenous contrast.  Contrast: 80mL OMNIPAQUE IOHEXOL 300 MG/ML  SOLN  Comparison: 09/07/2012.  Findings: Fluid filled dilated loops of small bowel to the level of the proximal ileum.  At this level there is a loop of small bowel which has contents which appear fecal like (can be seen with malabsorption syndrome) at which point there is an abrupt change caliber of small bowel.  Cause is indeterminate as a discrete hernia is not identified.  It is possible this represents small bowel obstruction from adhesions.  Fluid in the abdomen partially surrounds portions of bowel limiting evaluation for possibility of colitis and / or diverticulitis. There are scattered diverticula throughout the left colon and sigmoid colon.  My suspicion is that the fluid is related to the small bowel abnormality or cirrhosis.  Bilateral renal lesions most of which appear to be cysts.  Some smaller lesions are too small to adequately characterize.  No hydronephrosis.  No worrisome focal splenic, adrenal or pancreatic lesion.  Kyphosis throughout the lumbar junction with degenerative changes without worrisome bony destructive lesion.  Subcutaneous lesions have improved or remain the same and may be related to patient's prior trauma as per prior CT report.  Noncontrast filled views of the urinary bladder unremarkable.  IMPRESSION: Findings raise possibility of small bowel obstruction.  Cause is indeterminate.  Colonic diverticula.  Given the presence of ascites, it is difficult to exclude or evaluate for the possibility of  diverticulitis/colitis however, my suspicion is that fluid is related to small bowel abnormality or cirrhosis rather than primary colonic abnormality.  Please see above.   Original Report Authenticated By: Lacy Duverney, M.D.    Dg Abd Acute W/chest  12/25/2012  *RADIOLOGY REPORT*  Clinical Data: Abdominal pain.  Nausea and vomiting.  ACUTE ABDOMEN SERIES (ABDOMEN 2 VIEW & CHEST 1 VIEW)  Comparison: Chest radiograph on 09/03/2012  Findings: A few mildly dilated small bowel loops are seen in the central abdomen which have air fluid levels.  Some gas and stool is seen throughout majority the colon.  A partial small bowel obstruction cannot be excluded.  There is no evidence of free air. Vascular calcification is noted, but no radiopaque calculi identified.  Mild cardiomegaly is stable.  Low lung volumes again noted.  No evidence of acute infiltrate or edema.  No evidence of pleural effusion.  Prior CABG again noted.  IMPRESSION:  1.  Mildly dilated small bowel loops.  Partial small bowel obstruction cannot be excluded. 2.  Cardiomegaly low lung volumes.  No active lung disease.   Original Report Authenticated By: Myles Rosenthal, M.D.     Review of Systems  Constitutional: Positive for weight loss. Negative for fever and chills.  Respiratory: Negative for shortness of breath and wheezing.   Cardiovascular: Positive for leg swelling. Negative for chest pain and palpitations.  Gastrointestinal: Positive for nausea, vomiting, abdominal pain and constipation. Negative for heartburn, diarrhea and blood in stool.  Musculoskeletal: Positive for back pain and joint pain.  Neurological: Positive for sensory change. Negative for focal weakness and loss of consciousness.    Blood pressure 115/74, pulse 82, temperature 97.8 F (36.6 C), temperature source Oral, resp. rate 18, SpO2 95.00%. Physical Exam  Gen.: Somewhat chronically ill-appearing Caucasian male who does  not appear in any severe distress Skin: No rash or  infection.somewhat poor turgor. HEENT: Healed incision left neck. Mucous membranes are very dry. Sclera nonicteric. Pupils equal and reactive. No masses or thyromegaly. Lymph nodes: No cervical, supraclavicular or inguinal nodes palpable Lungs: Clear equal breath sounds anteriorly without increased work of breathing or wheezing Cardiac: Regular rate and rhythm. One to 2+ lower extremity edema. No JVD. No palpable pulses in the feet which are adequately perfused but somewhat cool Abdomen: Appears mildly distended. Tympanitic in the upper abdomen. Large abdominal pannus secondary to weight loss. Bowel sounds are present. There is a small nontender reducible umbilical hernia. There is mild-to-moderate tenderness in the midabdomen above the umbilicus. Mild diastases. Minimal guarding and no peritoneal signs. No discernible masses or organomegaly. Extremities: 1-2+ edema of lower extremities with some chronic venous stasis changes. Neurologic: He is alert and fully oriented. Decreased sensation in a stocking distribution of the lower extremities. No gross motor deficits. Normal affect.  Assessment/Plan Acute abdominal pain and nausea and vomiting. A CT scan shows evidence of a partial small bowel obstruction with a transition point and fecalization of the small intestine at this point. He has no previous history of abdominal surgery. He has chronic pain and takes chronic narcotics. This may represent a partial obstruction secondary to slow transit and fecalization of the small bowel. Cannot rule out inflammatory adhesion or even neoplasm at this point. He does not appear to have an acute surgical abdomen. The patient will be admitted and treated with IV hydration, pain and nausea medication. Will observe closely and repeat abdominal x-rays in the a.m.  Ayub Kirsh T 12/26/2012, 1:00 AM

## 2012-12-27 ENCOUNTER — Inpatient Hospital Stay (HOSPITAL_COMMUNITY): Payer: Medicare Other

## 2012-12-27 LAB — GLUCOSE, CAPILLARY
Glucose-Capillary: 100 mg/dL — ABNORMAL HIGH (ref 70–99)
Glucose-Capillary: 108 mg/dL — ABNORMAL HIGH (ref 70–99)
Glucose-Capillary: 123 mg/dL — ABNORMAL HIGH (ref 70–99)
Glucose-Capillary: 96 mg/dL (ref 70–99)

## 2012-12-27 MED ORDER — MENTHOL 3 MG MT LOZG
1.0000 | LOZENGE | OROMUCOSAL | Status: DC | PRN
  Administered 2012-12-31: 3 mg via ORAL
  Filled 2012-12-27: qty 9

## 2012-12-27 NOTE — Progress Notes (Signed)
Patient ID: [name], male   DOB: 01-15-1946, 67 y.o.   MRN: [id number]    Subjective: Pt feels ok today.  No abdominal pain.  No flatus or BM.  Objective: Vital signs in last 24 hours: Temp:  [97.5 F (36.4 C)-97.9 F (36.6 C)] 97.9 F (36.6 C) (03/19 0433) Pulse Rate:  [63-76] 68 (03/19 0433) Resp:  [18-20] 20 (03/19 0433) BP: (86-131)/(47-88) 99/53 mmHg (03/19 0433) SpO2:  [90 %-94 %] 94 % (03/19 0433) Last BM Date: 12/22/12  Intake/Output from previous day: 03/18 0701 - 03/19 0700 In: 1742 [P.O.:80; I.V.:1662] Out: 1700 [Emesis/NG output:1700] Intake/Output this shift:    PE: Abd: soft, NT, ND, few BS, NGT with watered down bilious output  Lab Results:   Recent Labs  12/25/12 1403  WBC 8.5  HGB 12.6*  HCT 40.0  PLT 125*   BMET  Recent Labs  12/25/12 1403 12/26/12 0445  NA 139 142  K 4.1 4.9  CL 96 99  CO2 32 29  GLUCOSE 204* 171*  BUN 57* 51*  CREATININE 1.00 1.01  CALCIUM 10.5 10.3   PT/INR No results found for this basename: LABPROT, INR,  in the last 72 hours CMP     Component Value Date/Time   NA 142 12/26/2012 0445   K 4.9 12/26/2012 0445   CL 99 12/26/2012 0445   CO2 29 12/26/2012 0445   GLUCOSE 171* 12/26/2012 0445   BUN 51* 12/26/2012 0445   CREATININE 1.01 12/26/2012 0445   CALCIUM 10.3 12/26/2012 0445   PROT 9.1* 12/25/2012 1403   ALBUMIN 4.2 12/25/2012 1403   AST 20 12/25/2012 1403   ALT 11 12/25/2012 1403   ALKPHOS 88 12/25/2012 1403   BILITOT 1.0 12/25/2012 1403   GFRNONAA 74* 12/26/2012 0445   GFRAA 86* 12/26/2012 0445   Lipase     Component Value Date/Time   LIPASE 41 12/25/2012 1452       Studies/Results: Ct Abdomen Pelvis W Contrast  12/25/2012  *RADIOLOGY REPORT*  Clinical Data: Lower abdominal pain for 2 days with vomiting. Hypertension with hyperlipidemia.  CT ABDOMEN AND PELVIS WITH CONTRAST  Technique:  Multidetector CT imaging of the abdomen and pelvis was performed following the standard protocol during bolus  administration of intravenous contrast.  Contrast: 80mL OMNIPAQUE IOHEXOL 300 MG/ML  SOLN  Comparison: 09/07/2012.  Findings: Fluid filled dilated loops of small bowel to the level of the proximal ileum.  At this level there is a loop of small bowel which has contents which appear fecal like (can be seen with malabsorption syndrome) at which point there is an abrupt change caliber of small bowel.  Cause is indeterminate as a discrete hernia is not identified.  It is possible this represents small bowel obstruction from adhesions.  Fluid in the abdomen partially surrounds portions of bowel limiting evaluation for possibility of colitis and / or diverticulitis. There are scattered diverticula throughout the left colon and sigmoid colon.  My suspicion is that the fluid is related to the small bowel abnormality or cirrhosis.  Bilateral renal lesions most of which appear to be cysts.  Some smaller lesions are too small to adequately characterize.  No hydronephrosis.  No worrisome focal splenic, adrenal or pancreatic lesion.  Kyphosis throughout the lumbar junction with degenerative changes without worrisome bony destructive lesion.  Subcutaneous lesions have improved or remain the same and may be related to patient's prior trauma as per prior CT report.  Noncontrast filled views of the urinary bladder unremarkable.  IMPRESSION: Findings raise possibility of small bowel obstruction.  Cause is indeterminate.  Colonic diverticula.  Given the presence of ascites, it is difficult to exclude or evaluate for the possibility of diverticulitis/colitis however, my suspicion is that fluid is related to small bowel abnormality or cirrhosis rather than primary colonic abnormality.  Please see above.   Original Report Authenticated By: Lacy Duverney, M.D.    Dg Abd 2 Views  12/26/2012  *RADIOLOGY REPORT*  Clinical Data: SBO  ABDOMEN - 2 VIEW  Comparison: Yesterday  Findings: Dilated small bowel loops with air-fluid levels and  increased in caliber.  Prominent stool burden in the proximal colon.  There is no free intraperitoneal gas on the left decubitus image.  IMPRESSION: Worsening partial small bowel obstruction pattern.  No free intraperitoneal gas.   Original Report Authenticated By: Jolaine Click, M.D.    Dg Abd Acute W/chest  12/25/2012  *RADIOLOGY REPORT*  Clinical Data: Abdominal pain.  Nausea and vomiting.  ACUTE ABDOMEN SERIES (ABDOMEN 2 VIEW & CHEST 1 VIEW)  Comparison: Chest radiograph on 09/03/2012  Findings: A few mildly dilated small bowel loops are seen in the central abdomen which have air fluid levels.  Some gas and stool is seen throughout majority the colon.  A partial small bowel obstruction cannot be excluded.  There is no evidence of free air. Vascular calcification is noted, but no radiopaque calculi identified.  Mild cardiomegaly is stable.  Low lung volumes again noted.  No evidence of acute infiltrate or edema.  No evidence of pleural effusion.  Prior CABG again noted.  IMPRESSION:  1.  Mildly dilated small bowel loops.  Partial small bowel obstruction cannot be excluded. 2.  Cardiomegaly low lung volumes.  No active lung disease.   Original Report Authenticated By: Myles Rosenthal, M.D.     Anti-infectives: Anti-infectives   None       Assessment/Plan  1. PSBO, no h/o abdominal surgery  Patient Active Problem List  Diagnosis  . DM  . HYPERLIPIDEMIA  . OBSTRUCTIVE SLEEP APNEA  . HYPERTENSION  . DIASTOLIC HEART FAILURE, CHRONIC  . CAROTID STENOSIS  . CHRONIC OBSTRUCTIVE PULMONARY DISEASE  . EDEMA  . Dyspnea  . CAD  . ETOH abuse  . Atrial fibrillation  . Renal insufficiency  . NSTEMI (non-ST elevated myocardial infarction)  . Hematoma-on back, sacral area, right chest wall post fall  . Acute renal failure  . Weakness generalized  . Hypothyroidism  . Acute respiratory failure with hypoxia  . Elevated troponin  . Elevated LFTs  . Acute on chronic diastolic CHF (congestive heart  failure)  . Pseudomonas urinary tract infection  . Hypokalemia  . L1 vertebral fracture  . Physical deconditioning  . Seasonal allergies   Plan: 1. Repeat abdominal films today.  The patient has never had abdominal surgery, so unclear source of PSBO.  There was no evidence for mass, hernia, or internal cause on CT scan.  2. Cont NPO and NGT for now.  LOS: 2 days    OSBORNE,KELLY E 12/27/2012, 11:20 AM Pager: [phone]  Feels better.  NG tube clearing.  Abdomen benign. NG tube for another day and will consider clamping and removal tomorrow.

## 2012-12-28 LAB — GLUCOSE, CAPILLARY
Glucose-Capillary: 89 mg/dL (ref 70–99)
Glucose-Capillary: 92 mg/dL (ref 70–99)
Glucose-Capillary: 95 mg/dL (ref 70–99)
Glucose-Capillary: 97 mg/dL (ref 70–99)

## 2012-12-28 NOTE — Progress Notes (Signed)
Patient ID: [name], male   DOB: 02/10/1946, 67 y.o.   MRN: [id number]    Subjective: Has been having flatus, denies BM, denies n/v, wants to get NGT out..  Objective: Vital signs in last 24 hours: Temp:  [97.5 F (36.4 C)-97.8 F (36.6 C)] 97.5 F (36.4 C) (03/20 0620) Pulse Rate:  [79-89] 89 (03/20 0620) Resp:  [18] 18 (03/20 0620) BP: (116-150)/(49-84) 145/84 mmHg (03/20 0620) SpO2:  [96 %-99 %] 99 % (03/20 0620) Last BM Date: 12/22/12  Intake/Output from previous day: 12/30/2022 0701 - 03/20 0700 In: 2700 [P.O.:300; I.V.:2400] Out: 1800 [Emesis/NG output:1800] Intake/Output this shift:    PE: Abd: soft, NT, +BS, NGT with watered down bilious output Heart: rrr Lungs: CTA bil General: awake, alert, NAD  Lab Results:   Recent Labs  12/25/12 1403  WBC 8.5  HGB 12.6*  HCT 40.0  PLT 125*   BMET  Recent Labs  12/25/12 1403 12/26/12 0445  NA 139 142  K 4.1 4.9  CL 96 99  CO2 32 29  GLUCOSE 204* 171*  BUN 57* 51*  CREATININE 1.00 1.01  CALCIUM 10.5 10.3   PT/INR No results found for this basename: LABPROT, INR,  in the last 72 hours CMP     Component Value Date/Time   NA 142 12/26/2012 0445   K 4.9 12/26/2012 0445   CL 99 12/26/2012 0445   CO2 29 12/26/2012 0445   GLUCOSE 171* 12/26/2012 0445   BUN 51* 12/26/2012 0445   CREATININE 1.01 12/26/2012 0445   CALCIUM 10.3 12/26/2012 0445   PROT 9.1* 12/25/2012 1403   ALBUMIN 4.2 12/25/2012 1403   AST 20 12/25/2012 1403   ALT 11 12/25/2012 1403   ALKPHOS 88 12/25/2012 1403   BILITOT 1.0 12/25/2012 1403   GFRNONAA 74* 12/26/2012 0445   GFRAA 86* 12/26/2012 0445   Lipase     Component Value Date/Time   LIPASE 41 12/25/2012 1452       Studies/Results: Dg Abd 2 Views  Dec 29, 2012  *RADIOLOGY REPORT*  Clinical Data: Abdominal pain.  Partial small bowel obstruction.  ABDOMEN - 2 VIEW  Comparison: 03/18 and 12/25/2012  Findings: There is persistent dilatation of multiple small bowel loops in the left side of the  abdomen.  Some of the CT contrast has passed into the nondistended ascending colon.  The findings are consistent with persistent partial small bowel obstruction.  The stomach is not distended.  Contrast is present in the normal appearing bladder.  IMPRESSION: Persistent partial small bowel obstruction.   Original Report Authenticated By: Francene Boyers, M.D.     Anti-infectives: Anti-infectives   None       Assessment/Plan  1. PSBO, no h/o abdominal surgery:  Having flatus now  --clamp NGT, if doing well after 4-5 hours can d/c  --keep on ice chips only for now  --repeat films if symptoms worsen Patient Active Problem List  Diagnosis  . DM  . HYPERLIPIDEMIA  . OBSTRUCTIVE SLEEP APNEA  . HYPERTENSION  . DIASTOLIC HEART FAILURE, CHRONIC  . CAROTID STENOSIS  . CHRONIC OBSTRUCTIVE PULMONARY DISEASE  . EDEMA  . Dyspnea  . CAD  . ETOH abuse  . Atrial fibrillation  . Renal insufficiency  . NSTEMI (non-ST elevated myocardial infarction)  . Hematoma-on back, sacral area, right chest wall post fall  . Acute renal failure  . Weakness generalized  . Hypothyroidism  . Acute respiratory failure with hypoxia  . Elevated troponin  . Elevated LFTs  .  Acute on chronic diastolic CHF (congestive heart failure)  . Pseudomonas urinary tract infection  . Hypokalemia  . L1 vertebral fracture  . Physical deconditioning  . Seasonal allergies     LOS: 3 days    WHITE, [name] 12/28/2012, 10:01 AM   . His NG is out and he is pain free.  Says that he is passing flatus.  Trial clears tomorrow.

## 2012-12-29 ENCOUNTER — Inpatient Hospital Stay (HOSPITAL_COMMUNITY): Payer: Medicare Other

## 2012-12-29 DIAGNOSIS — N179 Acute kidney failure, unspecified: Secondary | ICD-10-CM

## 2012-12-29 DIAGNOSIS — I5032 Chronic diastolic (congestive) heart failure: Secondary | ICD-10-CM

## 2012-12-29 DIAGNOSIS — I4891 Unspecified atrial fibrillation: Secondary | ICD-10-CM

## 2012-12-29 DIAGNOSIS — J449 Chronic obstructive pulmonary disease, unspecified: Secondary | ICD-10-CM

## 2012-12-29 LAB — GLUCOSE, CAPILLARY
Glucose-Capillary: 90 mg/dL (ref 70–99)
Glucose-Capillary: 87 mg/dL (ref 70–99)
Glucose-Capillary: 66 mg/dL — ABNORMAL LOW (ref 70–99)
Glucose-Capillary: 127 mg/dL — ABNORMAL HIGH (ref 70–99)
Glucose-Capillary: 181 mg/dL — ABNORMAL HIGH (ref 70–99)

## 2012-12-29 LAB — BASIC METABOLIC PANEL
Chloride: 101 mEq/L (ref 96–112)
GFR calc Af Amer: 40 mL/min — ABNORMAL LOW (ref >90)
GFR calc non Af Amer: 34 mL/min — ABNORMAL LOW (ref >90)
Potassium: 4.2 mEq/L (ref 3.5–5.1)
Sodium: 138 mEq/L (ref 135–145)

## 2012-12-29 MED ORDER — DEXTROSE 50 % IV SOLN
25.0000 mL | Freq: Once | INTRAVENOUS | Status: AC | PRN
  Administered 2012-12-29: 25 mL via INTRAVENOUS

## 2012-12-29 MED ORDER — DEXTROSE 50 % IV SOLN
INTRAVENOUS | Status: AC
  Filled 2012-12-29: qty 50

## 2012-12-29 MED ORDER — FUROSEMIDE 10 MG/ML IJ SOLN
40.0000 mg | Freq: Two times a day (BID) | INTRAMUSCULAR | Status: AC
  Administered 2012-12-29 – 2012-12-31 (×4): 40 mg via INTRAVENOUS
  Filled 2012-12-29 (×5): qty 4

## 2012-12-29 MED ORDER — POTASSIUM CHLORIDE 2 MEQ/ML IV SOLN
INTRAVENOUS | Status: DC
  Administered 2012-12-29 – 2012-12-31 (×3): via INTRAVENOUS
  Filled 2012-12-29 (×3): qty 1000

## 2012-12-29 NOTE — Progress Notes (Signed)
With elevated creatinine and lower extremity, I have asked the hospitalist to come by and help manage some of his medical issues.

## 2012-12-29 NOTE — Progress Notes (Signed)
Hypoglycemic Event  CBG: 66  Treatment: 25 ml D50  Symptoms: None  Follow-up CBG: Time:1255 CBG Result:127  Possible Reasons for Event: NPO  Comments/MD notified:Kelly Earl Gala PA    Laurita Quint, Allisa Einspahr L  Remember to initiate Hypoglycemia Order Set & complete

## 2012-12-29 NOTE — Progress Notes (Signed)
  Subjective: No nausea or vomiting.  Still passing flatus  Objective: Vital signs in last 24 hours: Temp:  [97.5 F (36.4 C)-98.1 F (36.7 C)] 97.5 F (36.4 C) (03/21 0610) Pulse Rate:  [75-88] 88 (03/21 0610) Resp:  [18-20] 18 (03/21 0610) BP: (129-147)/(75-87) 131/75 mmHg (03/21 0610) SpO2:  [96 %-100 %] 96 % (03/21 0610) FiO2 (%):  [2 %] 2 % (03/20 2100) Last BM Date: 12/22/12  Intake/Output from previous day: 03/20 0701 - 03/21 0700 In: 2380 [I.V.:2380] Out: 300 [Urine:300] Intake/Output this shift:    General appearance: alert, cooperative and no distress Resp: nonlabored Cardio: normal rate, regular GI: soft, NT, seems slightly more distended this am, no peritoneal signs He does have some bilat LE pitting edema as well  Lab Results:  No results found for this basename: WBC, HGB, HCT, PLT,  in the last 72 hours BMET No results found for this basename: NA, K, CL, CO2, GLUCOSE, BUN, CREATININE, CALCIUM,  in the last 72 hours PT/INR No results found for this basename: LABPROT, INR,  in the last 72 hours ABG No results found for this basename: PHART, PCO2, PO2, HCO3,  in the last 72 hours  Studies/Results: Dg Abd 2 Views  Jan 02, 2013  *RADIOLOGY REPORT*  Clinical Data: Abdominal pain.  Partial small bowel obstruction.  ABDOMEN - 2 VIEW  Comparison: 03/18 and 12/25/2012  Findings: There is persistent dilatation of multiple small bowel loops in the left side of the abdomen.  Some of the CT contrast has passed into the nondistended ascending colon.  The findings are consistent with persistent partial small bowel obstruction.  The stomach is not distended.  Contrast is present in the normal appearing bladder.  IMPRESSION: Persistent partial small bowel obstruction.   Original Report Authenticated By: Francene Boyers, M.D.     Anti-infectives: Anti-infectives   None      Assessment/Plan: s/p * No surgery found * he is passing flatus but he seems slightly more distended  today on exam, will check abdominal xrays prior to advancing diet.  if they look okay then will start some clears  LOS: 4 days    Lodema Pilot DAVID 12/29/2012

## 2012-12-29 NOTE — Consult Note (Signed)
[facility] Medical Consultation  [name] GLO:[id number] DOB: 08-10-1946 DOA: 12/25/2012 PCP: [name] Alvine, MD   Requesting physician: Dr. Hulan [name] Date of consultation: 12/29/2012 Reason for consultation: acute renal failure and management of medical issues  Impression/Recommendations Active Problems:   * No active hospital problems. *  1. Acute renal failure. Unclear etiology, but possibly prerenal due to decreased cardiac output in the setting of excess volume. Patient does have evidence of pedal edema. He is receiving IV fluids since admission. He normally takes Demadex and spironolactone as an outpatient. Other etiologies include contrast-induced nephropathy, since patient did receive IV contrast for CT scan on admission. He likely has some element of chronic kidney disease. Baseline creatinines over the past year have ranged from 1.3-1.7. We'll check urinalysis, urine sodium, renal ultrasound. We'll hold IV fluids for now and start Lasix. Monitor urine output. Avoid nephrotoxic agents. 2. Chronic diastolic congestive heart failure. Patient had an echocardiogram done with normal ejection fraction within the past 6 months. This does not need to be repeated. Restart Lasix and continue beta blocker. 3. COPD. Patient wears oxygen at home during the night. He does not have any evidence of wheezing or significant shortness of breath at this time. We'll continue outpatient regimen. 4. Diabetes. Agree with sliding scale insulin. He did have an episode of hypoglycemia, but this was likely secondary to decreased by mouth intake. He does appear to be tolerating clear liquids at this time. Once his by mouth intake is more consistent, would restart Lantus. 5. Hypothyroidism. Continue levothyroxine. Would change medications to by mouth form once he is reliably taking by mouth. 6. Hypertension. Appears to be controlled on current regimen. 7. History of atrial fibrillation. Heart rate is  currently controlled. He's not felt to be Coumadin candidate due to history of alcohol abuse. 8. History of alcohol abuse. Patient reports he's not had any alcohol for the past few months. No signs of withdrawal. 9. Partial small bowel obstruction. Per surgery. Clinically he appears to be improving. He is having bowel movements and tolerating clear liquids. NG tube has been discontinued.  10.  history of coronary artery disease status post CABG. No chest pains or signs of ischemia. Restart aspirin when patient can take by mouth.  I will followup again tomorrow. Please contact me if I can be of assistance in the meanwhile. Thank you for this consultation.  Chief Complaint: abdominal pain   HPI:   this is a 67 year old gentleman who presents as an emergency room with abdominal pain. He takes narcotics for chronic pain and has chronic constipation using MiraLAX daily. He was found to have a partial small bowel obstruction on imaging and was admitted to the surgical service for further management. Since his admission, clinically he appears to be improving. His creatinine on admission was normal at 0.9. Since then, it has elevated to 1.9. We have been requested to see the patient regarding his multiple medical issues as well as possible acute renal failure  Review of Systems:   positive for abdominal pain, nausea, vomiting. Remainder of systems are found to be negative.   Past Medical History  Diagnosis Date  . CHRONIC OBSTRUCTIVE PULMONARY DISEASE 06/20/2009  . OBSTRUCTIVE SLEEP APNEA 06/20/2009  . CAROTID STENOSIS 06/20/2009    A. 08/2001 s/p L CEA;  B.   09/14/11 - Carotid U/S - 40-59% bilateral stenosis, left CEA patch angioplasty is patent  . DM 06/20/2009  . CAD 06/20/2009    A.  08/2000 - s/p CABG  x 4 - LIMA-LAD, Left Radial-OM, VG-DIAG, VG-RCA;  B. Neg. MV  2010  . HYPERLIPIDEMIA 06/20/2009  . HYPERTENSION 06/20/2009  . Hypothyroidism   . Low back pain   . Asthma     as child  . Pneumonia    . Atrial fibrillation     Not felt to be coumadin candidate 2/2 ETOH use.  Marland Kitchen Hypothyroidism   . ETOH abuse   . History of tobacco abuse     remote - quit 1970  . Bilateral renal cysts   . Marijuana abuse   . Morbidly obese   . CAD (coronary artery disease)   . CHF (congestive heart failure)   . Falls frequently    Past Surgical History  Procedure Laterality Date  . Carotid endarterectomy  2002    left  . Coronary artery bypass graft      x 4 - 2001   Social History:  reports that he quit smoking about 44 years ago. He does not have any smokeless tobacco history on file. He reports that  drinks alcohol. He reports that he uses illicit drugs (Marijuana).  Allergies  Allergen Reactions  . Erythromycin Hives   Family History  Problem Relation Age of Onset  . Stroke Mother     ?  . Stroke Father     ?    Prior to Admission medications   Medication Sig Start Date End Date Taking? Authorizing Provider  aspirin EC 325 MG tablet Take 325 mg by mouth daily.   Yes Historical Provider, MD  carvedilol (COREG) 3.125 MG tablet Take 3.125 mg by mouth 2 (two) times daily with a meal. Take with carvedilol 6.25mg  for a total of 9.375mg    Yes Historical Provider, MD  carvedilol (COREG) 6.25 MG tablet Take 6.25 mg by mouth 2 (two) times daily with a meal. Take with 3.125mg  for a total of 9.375mg    Yes Historical Provider, MD  FLUoxetine (PROZAC) 20 MG capsule Take 20 mg by mouth daily.     Yes Historical Provider, MD  fluticasone (FLOVENT HFA) 220 MCG/ACT inhaler Inhale 1 puff into the lungs 2 (two) times daily as needed (for shortness of breath).    Yes Historical Provider, MD  folic acid (FOLVITE) 1 MG tablet Take 1 tablet (1 mg total) by mouth daily. 09/07/12  Yes [name] Cherlynn Kaiser, MD  gabapentin (NEURONTIN) 300 MG capsule Take 1 capsule (300 mg total) by mouth daily. 09/07/12  Yes [name] Cherlynn Kaiser, MD  insulin glargine (LANTUS) 100 UNIT/ML injection Inject 10 Units into the skin at bedtime.  09/21/12  Yes [name] Kanner Love, PA-C  lactulose (CHRONULAC) 10 GM/15ML solution Take 45 mLs (30 g total) by mouth daily. 09/21/12  Yes [name] Kanner Love, PA-C  levothyroxine (SYNTHROID, LEVOTHROID) 150 MCG tablet Take 150 mcg by mouth daily.    Yes Historical Provider, MD  Multiple Vitamin (MULTIVITAMIN) capsule Take 1 capsule by mouth daily.   Yes Historical Provider, MD  omeprazole (PRILOSEC) 20 MG capsule Take 20 mg by mouth daily.     Yes Historical Provider, MD  oxyCODONE (OXY IR/ROXICODONE) 5 MG immediate release tablet Take 5 mg by mouth every 6 (six) hours as needed for pain.   Yes Historical Provider, MD  senna (SENOKOT) 8.6 MG tablet Take 2 tablets by mouth daily as needed for constipation.    Yes Historical Provider, MD  simvastatin (ZOCOR) 40 MG tablet Take 40 mg by mouth at bedtime.     Yes Historical Provider, MD  sodium chloride (OCEAN) 0.65 % SOLN nasal spray Place 1 spray into the nose as needed for congestion. 09/21/12  Yes [name] Kanner Love, PA-C  spironolactone (ALDACTONE) 25 MG tablet Take 1 tablet (25 mg total) by mouth daily. 11/22/12  Yes Dolores Patty, MD  torsemide (DEMADEX) 20 MG tablet Take 4 tablets (80 mg total) by mouth daily. 12/20/12  Yes Hadassah Pais, PA-C   Physical Exam: Blood pressure 144/76, pulse 78, temperature 98.2 F (36.8 C), temperature source Oral, resp. rate 18, height 5\' 10"  (1.778 m), weight 105.235 kg (232 lb), SpO2 100.00%. Filed Vitals:   12/29/12 0610 12/29/12 1136 12/29/12 1300 12/29/12 1640  BP: 131/75 150/80 124/65 144/76  Pulse: 88 85 76 78  Temp: 97.5 F (36.4 C)  97.6 F (36.4 C) 98.2 F (36.8 C)  TempSrc: Oral  Oral   Resp: 18  18   Height:      Weight:      SpO2: 96%  93% 100%     General:  lying in bed, no acute distress   Eyes:  Pupils are equal round react to light  ENT:  mucous membranes are moist   Neck: Supple   Cardiovascular:  S1, S2, regular rate and rhythm   Respiratory:  clear to auscultation bilaterally with  diminished breath sounds at the bases   Abdomen:  soft, nontender, bowel sounds are active   Skin: No rashes   Musculoskeletal: 1+ pitting edema bilaterally in lower extremities   Psychiatric: normal affect, cooperative with exam   Neurologic:  grossly intact, nonfocal   Labs on Admission:  Basic Metabolic Panel:  Recent Labs Lab 12/25/12 1403 12/26/12 0445 12/29/12 0905  NA 139 142 138  K 4.1 4.9 4.2  CL 96 99 101  CO2 32 29 25  GLUCOSE 204* 171* 101*  BUN 57* 51* 61*  CREATININE 1.00 1.01 1.91*  CALCIUM 10.5 10.3 9.4   Liver Function Tests:  Recent Labs Lab 12/25/12 1403  AST 20  ALT 11  ALKPHOS 88  BILITOT 1.0  PROT 9.1*  ALBUMIN 4.2    Recent Labs Lab 12/25/12 1452  LIPASE 41   No results found for this basename: AMMONIA,  in the last 168 hours CBC:  Recent Labs Lab 12/25/12 1403  WBC 8.5  NEUTROABS 7.1  HGB 12.6*  HCT 40.0  MCV 81.8  PLT 125*   Cardiac Enzymes:  Recent Labs Lab 12/25/12 1422  TROPONINI <0.30   BNP: No components found with this basename: POCBNP,  CBG:  Recent Labs Lab 12/29/12 0410 12/29/12 0755 12/29/12 1201 12/29/12 1253 12/29/12 1616  GLUCAP 90 87 66* 127* 181*    Radiological Exams on Admission: Dg Abd 2 Views  12/29/2012  *RADIOLOGY REPORT*  Clinical Data: 67 year old male with recent bowel obstruction. Abdominal pain.  ABDOMEN - 2 VIEW  Comparison: 12/27/2012 and earlier.  Findings: Oral contrast in the transverse and left colon.  Sigmoid diverticulosis.  Decreased size of gas filled small bowel loops. Overall nonobstructed bowel gas pattern.  No pneumoperitoneum. Stable visualized osseous structures.  Multilevel disc and endplate degeneration in the lumbar spine.  IMPRESSION: Improved, now nonobstructed bowel gas pattern.  No free air. Sigmoid diverticulosis.   Original Report Authenticated By: Erskine Speed, M.D.    Time spent:  Adin Laker [facility] Pager [phone]  If 7PM-7AM,  please contact night-coverage [email or web address] Password [facility] 12/29/2012, 5:34 PM

## 2012-12-30 ENCOUNTER — Inpatient Hospital Stay (HOSPITAL_COMMUNITY): Payer: Medicare Other

## 2012-12-30 DIAGNOSIS — J9600 Acute respiratory failure, unspecified whether with hypoxia or hypercapnia: Secondary | ICD-10-CM

## 2012-12-30 LAB — CBC WITH DIFFERENTIAL/PLATELET
Basophils Absolute: 0 10*3/uL (ref 0.0–0.1)
Basophils Relative: 0 % (ref 0–1)
Eosinophils Absolute: 0.3 10*3/uL (ref 0.0–0.7)
Eosinophils Relative: 6 % — ABNORMAL HIGH (ref 0–5)
HCT: 34.2 % — ABNORMAL LOW (ref 39.0–52.0)
Lymphocytes Relative: 25 % (ref 12–46)
MCHC: 32.7 g/dL (ref 30.0–36.0)
MCV: 81.4 fL (ref 78.0–100.0)
Monocytes Absolute: 0.8 10*3/uL (ref 0.1–1.0)
Platelets: 125 10*3/uL — ABNORMAL LOW (ref 150–400)
RDW: 19.5 % — ABNORMAL HIGH (ref 11.5–15.5)
WBC: 5.4 10*3/uL (ref 4.0–10.5)

## 2012-12-30 LAB — URINALYSIS, ROUTINE W REFLEX MICROSCOPIC
Bilirubin Urine: NEGATIVE
Ketones, ur: NEGATIVE mg/dL
Nitrite: NEGATIVE
Protein, ur: NEGATIVE mg/dL
Specific Gravity, Urine: 1.01 (ref 1.005–1.030)
Urobilinogen, UA: 1 mg/dL (ref 0.0–1.0)

## 2012-12-30 LAB — BASIC METABOLIC PANEL
BUN: 48 mg/dL — ABNORMAL HIGH (ref 6–23)
Calcium: 9.2 mg/dL (ref 8.4–10.5)
GFR calc Af Amer: 57 mL/min — ABNORMAL LOW (ref >90)
GFR calc non Af Amer: 49 mL/min — ABNORMAL LOW (ref >90)
Glucose, Bld: 98 mg/dL (ref 70–99)
Potassium: 4 mEq/L (ref 3.5–5.1)
Sodium: 139 mEq/L (ref 135–145)

## 2012-12-30 LAB — URINE MICROSCOPIC-ADD ON

## 2012-12-30 LAB — GLUCOSE, CAPILLARY
Glucose-Capillary: 103 mg/dL — ABNORMAL HIGH (ref 70–99)
Glucose-Capillary: 159 mg/dL — ABNORMAL HIGH (ref 70–99)

## 2012-12-30 LAB — CREATININE, URINE, RANDOM: Creatinine, Urine: 16.01 mg/dL

## 2012-12-30 MED ORDER — LEVOTHYROXINE SODIUM 150 MCG PO TABS
150.0000 ug | ORAL_TABLET | Freq: Every day | ORAL | Status: DC
  Administered 2012-12-30 – 2013-01-01 (×3): 150 ug via ORAL
  Filled 2012-12-30 (×5): qty 1

## 2012-12-30 MED ORDER — ASPIRIN EC 325 MG PO TBEC
325.0000 mg | DELAYED_RELEASE_TABLET | Freq: Every day | ORAL | Status: DC
  Administered 2012-12-30 – 2013-01-01 (×3): 325 mg via ORAL
  Filled 2012-12-30 (×4): qty 1

## 2012-12-30 NOTE — Progress Notes (Signed)
[facility]                                                                                Patient Demographics  [name], is a 67 y.o. male, DOB - 24-May-1946, ZOX:[id number], WJX:[id number]  Admit date - 12/25/2012  Admitting Physician Mariella Saa, MD  Outpatient Primary MD for the patient is [name], MD  LOS - 5   Chief Complaint  Patient presents with  . Abdominal Pain        Assessment & Plan    1. Acute renal failure. Unclear etiology, but possibly prerenal due to decreased cardiac output in the setting of excess volume. Patient does have evidence of 2+ pedal edema. He was receiving IV fluids since admission. He normally takes Demadex and spironolactone as an outpatient. Other etiologies include contrast-induced nephropathy, since patient did receive IV contrast for CT scan on admission. He likely has some element of chronic kidney disease. Baseline creatinines over the past year have ranged from 1.3-1.7. We'll check urinalysis, urine sodium, stable renal ultrasound. After starting IV Lasix and hold IVF creat much improved, continue IV Lasix till here.   2. Chronic diastolic congestive heart failure. Patient had an echocardiogram done with normal ejection fraction within the past 6 months. This does not need to be repeated. Continue IV Lasix and continue beta blocker.   3. COPD. Patient wears oxygen at home during the night. He does not have any evidence of wheezing or significant shortness of breath at this time. We'll continue outpatient regimen.   4. Diabetes. Agree with sliding scale insulin. He did have an episode of hypoglycemia, but this was likely secondary to decreased by mouth intake. He does appear to be tolerating clear liquids at this time. Once his by mouth intake is more consistent, would restart Lantus.  CBG (last 3)   Recent Labs  12/30/12 0013 12/30/12 0411 12/30/12 0758  GLUCAP 86 100* 105*        5. Hypothyroidism. Continue levothyroxine. Changed to PO.   6. Hypertension. Appears to be controlled on current regimen.   7. History of atrial fibrillation. Heart rate is currently controlled. He's not felt to be Coumadin candidate due to history of alcohol abuse.   8. History of alcohol abuse. Patient reports he's not had any alcohol for the past few months. No signs of withdrawal.   9. Partial small bowel obstruction. Per surgery. Clinically he appears to be improving. He is having bowel movements and tolerating clear liquids. NG tube has been discontinued.    10. History of CAD status post CABG. No chest pains or signs of ischemia. Restart aspirin.    DVT Prophylaxis    Heparin   Lab Results  Component Value Date   PLT 125* 12/25/2012    Medications  Scheduled Meds: . antiseptic oral rinse  15 mL Mouth Rinse q12n4p  . chlorhexidine  15 mL Mouth Rinse BID  . furosemide  40 mg Intravenous BID  . heparin  5,000 Units Subcutaneous Q8H  . insulin aspart  0-15 Units Subcutaneous Q4H  . levothyroxine  75 mcg Intravenous Daily  . metoprolol  5 mg Intravenous Q6H  . pantoprazole (PROTONIX)  IV  40 mg Intravenous QHS   Continuous Infusions: . dextrose 5 % and 0.45% NaCl 1,000 mL with potassium chloride 20 mEq infusion 20 mL/hr at 12/29/12 2053   PRN Meds:.fluticasone, menthol-cetylpyridinium, morphine injection, ondansetron, phenol  Antibiotics     Anti-infectives   None       Time Spent in minutes   35   Susa Raring K M.D on 12/30/2012 at 9:11 AM  Between 7am to 7pm - Pager - [phone]  After 7pm go to [email or web address] - password [email or web address]  And look for the night coverage person covering for me after hours  [facility] Office  [phone]    Subjective:   Deliah Boston today has, No headache, No chest pain, mild generalized abdominal pain - No Nausea, No new weakness tingling or numbness, No Cough - SOB.    Objective:   Filed  Vitals:   12/29/12 1640 12/29/12 2115 12/30/12 0029 12/30/12 0543  BP: 144/76 146/74 147/87 145/74  Pulse: 78  75 76  Temp: 98.2 F (36.8 C) 98.6 F (37 C)  98.9 F (37.2 C)  TempSrc:  Oral  Oral  Resp:  18  18  Height:      Weight:      SpO2: 100% 100%  100%    Wt Readings from Last 3 Encounters:  12/26/12 105.235 kg (232 lb)  12/19/12 101.379 kg (223 lb 8 oz)  11/13/12 116.03 kg (255 lb 12.8 oz)     Intake/Output Summary (Last 24 hours) at 12/30/12 0911 Last data filed at 12/30/12 6578  Gross per 24 hour  Intake 1964.51 ml  Output   1550 ml  Net 414.51 ml    Exam Awake Alert, Oriented X 3, No new F.N deficits, Normal affect [place].AT,PERRAL Supple Neck,No JVD, No cervical lymphadenopathy appriciated.  Symmetrical Chest wall movement, Good air movement bilaterally, few rales RRR,No Gallops,Rubs or new Murmurs, No Parasternal Heave +ve B.Sounds, Abd Soft, Non tender, No organomegaly appriciated, No rebound - guarding or rigidity. No Cyanosis, Clubbing 2+ edema, No new Rash or bruise    Data Review   Micro Results No results found for this or any previous visit (from the past 240 hour(s)).  Radiology Reports   US Renal  12/29/2012  *RADIOLOGY REPORT*  Clinical Data: Acute renal failure  RENAL/URINARY TRACT ULTRASOUND COMPLETE  Comparison: CT abdomen 12/25/2012  Findings:  Right Kidney = 13.4 cm.  There is an anechoic 4.3 cm cyst in the upper pole.  No hydronephrosis.  Left kidney = 13.2 of cm.  There two anechoic cyst measuring 2 and 3 cm.  No hydronephrosis appear  Bladder:  Bladder partially distended.  No irregularity identified.  IMPRESSION:  1.  No evidence of hydronephrosis or renal obstruction.  2.  Bilateral simple appearing renal cysts are unchanged from comparison CT.   Original Report Authenticated By: Genevive Bi, M.D.    Dg Abd 2 Views  12/29/2012  *RADIOLOGY REPORT*  Clinical Data: 67 year old male with recent bowel obstruction. Abdominal pain.   ABDOMEN - 2 VIEW  Comparison: 12/27/2012 and earlier.  Findings: Oral contrast in the transverse and left colon.  Sigmoid diverticulosis.  Decreased size of gas filled small bowel loops. Overall nonobstructed bowel gas pattern.  No pneumoperitoneum. Stable visualized osseous structures.  Multilevel disc and endplate degeneration in the lumbar spine.  IMPRESSION: Improved, now nonobstructed bowel gas pattern.  No free air. Sigmoid diverticulosis.   Original Report Authenticated By: Erskine Speed, M.D.  CBC  Recent Labs Lab 12/25/12 1403  WBC 8.5  HGB 12.6*  HCT 40.0  PLT 125*  MCV 81.8  MCH 25.8*  MCHC 31.5  RDW 19.5*  LYMPHSABS 0.8  MONOABS 0.6  EOSABS 0.0  BASOSABS 0.0    Chemistries   Recent Labs Lab 12/25/12 1403 12/26/12 0445 12/29/12 0905 12/30/12 0700  NA 139 142 138 139  K 4.1 4.9 4.2 4.0  CL 96 99 101 102  CO2 32 29 25 26   GLUCOSE 204* 171* 101* 98  BUN 57* 51* 61* 48*  CREATININE 1.00 1.01 1.91* 1.42*  CALCIUM 10.5 10.3 9.4 9.2  AST 20  --   --   --   ALT 11  --   --   --   ALKPHOS 88  --   --   --   BILITOT 1.0  --   --   --    ------------------------------------------------------------------------------------------------------------------ estimated creatinine clearance is 59.7 ml/min (by C-G formula based on Cr of 1.42). ------------------------------------------------------------------------------------------------------------------ No results found for this basename: HGBA1C,  in the last 72 hours ------------------------------------------------------------------------------------------------------------------ No results found for this basename: CHOL, HDL, LDLCALC, TRIG, CHOLHDL, LDLDIRECT,  in the last 72 hours ------------------------------------------------------------------------------------------------------------------ No results found for this basename: TSH, T4TOTAL, FREET3, T3FREE, THYROIDAB,  in the last 72  hours ------------------------------------------------------------------------------------------------------------------ No results found for this basename: VITAMINB12, FOLATE, FERRITIN, TIBC, IRON, RETICCTPCT,  in the last 72 hours  Coagulation profile No results found for this basename: INR, PROTIME,  in the last 168 hours  No results found for this basename: DDIMER,  in the last 72 hours  Cardiac Enzymes  Recent Labs Lab 12/25/12 1422  TROPONINI <0.30   ------------------------------------------------------------------------------------------------------------------ No components found with this basename: POCBNP,

## 2012-12-30 NOTE — Progress Notes (Signed)
  Subjective: Has been feeling better until this am when he had increased abdominal pain.  Still passing flatus and has been moving his bowels  Objective: Vital signs in last 24 hours: Temp:  [97.6 F (36.4 C)-98.9 F (37.2 C)] 98.9 F (37.2 C) (03/22 0543) Pulse Rate:  [75-85] 76 (03/22 0543) Resp:  [18] 18 (03/22 0543) BP: (124-150)/(65-87) 145/74 mmHg (03/22 0543) SpO2:  [93 %-100 %] 100 % (03/22 0543) Last BM Date: 12/29/12  Intake/Output from previous day: 03/21 0701 - 03/22 0700 In: 1964.5 [P.O.:480; I.V.:1484.5] Out: 1550 [Urine:1550] Intake/Output this shift:    General appearance: alert, cooperative and no distress Resp: clear to auscultation bilaterally Cardio: normal rate, regular GI: soft, not much tenderness now, also not much tenderness, exam is pretty benign  Lab Results:  No results found for this basename: WBC, HGB, HCT, PLT,  in the last 72 hours BMET  Recent Labs  12/29/12 0905 12/30/12 0700  NA 138 139  K 4.2 4.0  CL 101 102  CO2 25 26  GLUCOSE 101* 98  BUN 61* 48*  CREATININE 1.91* 1.42*  CALCIUM 9.4 9.2   PT/INR No results found for this basename: LABPROT, INR,  in the last 72 hours ABG No results found for this basename: PHART, PCO2, PO2, HCO3,  in the last 72 hours  Studies/Results: US Renal  12/29/2012  *RADIOLOGY REPORT*  Clinical Data: Acute renal failure  RENAL/URINARY TRACT ULTRASOUND COMPLETE  Comparison: CT abdomen 12/25/2012  Findings:  Right Kidney = 13.4 cm.  There is an anechoic 4.3 cm cyst in the upper pole.  No hydronephrosis.  Left kidney = 13.2 of cm.  There two anechoic cyst measuring 2 and 3 cm.  No hydronephrosis appear  Bladder:  Bladder partially distended.  No irregularity identified.  IMPRESSION:  1.  No evidence of hydronephrosis or renal obstruction.  2.  Bilateral simple appearing renal cysts are unchanged from comparison CT.   Original Report Authenticated By: Genevive Bi, M.D.    Dg Abd 2 Views  12/29/2012   *RADIOLOGY REPORT*  Clinical Data: 67 year old male with recent bowel obstruction. Abdominal pain.  ABDOMEN - 2 VIEW  Comparison: 12/27/2012 and earlier.  Findings: Oral contrast in the transverse and left colon.  Sigmoid diverticulosis.  Decreased size of gas filled small bowel loops. Overall nonobstructed bowel gas pattern.  No pneumoperitoneum. Stable visualized osseous structures.  Multilevel disc and endplate degeneration in the lumbar spine.  IMPRESSION: Improved, now nonobstructed bowel gas pattern.  No free air. Sigmoid diverticulosis.   Original Report Authenticated By: Erskine Speed, M.D.     Anti-infectives: Anti-infectives   None      Assessment/Plan: s/p * No surgery found * his creatinine is improved and his abdomen was improving as well until this morning.  he had onset of pain again but without nausea or vomiting.  I am not sure what is causing his pain.  he seemed to be improving but has had relapse of his pain.  will check xray and if he has the obstructive pattern again, we will need to consider dx laparoscopy, especially in the setting of obstruction without prior surgery  LOS: 5 days    Lodema Pilot DAVID 12/30/2012

## 2012-12-31 DIAGNOSIS — I509 Heart failure, unspecified: Secondary | ICD-10-CM

## 2012-12-31 DIAGNOSIS — I5033 Acute on chronic diastolic (congestive) heart failure: Secondary | ICD-10-CM

## 2012-12-31 LAB — GLUCOSE, CAPILLARY
Glucose-Capillary: 102 mg/dL — ABNORMAL HIGH (ref 70–99)
Glucose-Capillary: 116 mg/dL — ABNORMAL HIGH (ref 70–99)
Glucose-Capillary: 125 mg/dL — ABNORMAL HIGH (ref 70–99)
Glucose-Capillary: 141 mg/dL — ABNORMAL HIGH (ref 70–99)

## 2012-12-31 LAB — BASIC METABOLIC PANEL
BUN: 36 mg/dL — ABNORMAL HIGH (ref 6–23)
Calcium: 9 mg/dL (ref 8.4–10.5)
Creatinine, Ser: 1.19 mg/dL (ref 0.50–1.35)
GFR calc Af Amer: 70 mL/min — ABNORMAL LOW (ref >90)
GFR calc non Af Amer: 61 mL/min — ABNORMAL LOW (ref >90)
Glucose, Bld: 102 mg/dL — ABNORMAL HIGH (ref 70–99)

## 2012-12-31 MED ORDER — POTASSIUM CHLORIDE CRYS ER 20 MEQ PO TBCR
40.0000 meq | EXTENDED_RELEASE_TABLET | Freq: Every day | ORAL | Status: DC
  Administered 2012-12-31 – 2013-01-01 (×2): 40 meq via ORAL
  Filled 2012-12-31 (×3): qty 2

## 2012-12-31 MED ORDER — PANTOPRAZOLE SODIUM 40 MG PO TBEC
40.0000 mg | DELAYED_RELEASE_TABLET | Freq: Every day | ORAL | Status: DC
  Administered 2012-12-31 – 2013-01-01 (×2): 40 mg via ORAL
  Filled 2012-12-31 (×2): qty 1

## 2012-12-31 MED ORDER — TORSEMIDE 20 MG PO TABS
80.0000 mg | ORAL_TABLET | Freq: Every day | ORAL | Status: DC
  Administered 2013-01-01: 80 mg via ORAL
  Filled 2012-12-31: qty 4

## 2012-12-31 MED ORDER — SPIRONOLACTONE 25 MG PO TABS
25.0000 mg | ORAL_TABLET | Freq: Every day | ORAL | Status: DC
  Administered 2013-01-01: 25 mg via ORAL
  Filled 2012-12-31: qty 1

## 2012-12-31 NOTE — Progress Notes (Signed)
[facility]                                                                                Patient Demographics  [name], is a 67 y.o. male, DOB - 16-Jan-1946, WUJ:[id number], [id number]:[id number]  Admit date - 12/25/2012  Admitting Physician Mariella Saa, MD  Outpatient Primary MD for the patient is [name], MD  LOS - 6   Chief Complaint  Patient presents with  . Abdominal Pain        Assessment & Plan   Hospitalist was consulted for acute renal failure which has resolved. At this time hospitalist team will sign off. Discussed with Dr. Biagio Quint     1. Acute renal failure. Unclear etiology, but possibly prerenal due to decreased cardiac output in the setting of excess volume. Patient does have evidence of 2+ pedal edema. He was receiving IV fluids since admission. He normally takes Demadex and spironolactone as an outpatient. Other etiologies include contrast-induced nephropathy, since patient did receive IV contrast for CT scan on admission. He likely has some element of chronic kidney disease. Baseline creatinines over the past year have ranged from 1.3-1.7. We'll check urinalysis, urine sodium, stable renal ultrasound. After starting IV Lasix and hold IVF creat much improved, continue IV Lasix today thereafter we'll switch him on his oral home regimen from 01/01/2013. Check BMP every 48 hours.   2. Chronic diastolic congestive heart failure. Patient had an echocardiogram done with normal ejection fraction within the past 6 months. This does not need to be repeated. Continue diuresis and continue beta blocker.   3. COPD. Patient wears oxygen at home during the night. He does not have any evidence of wheezing or significant shortness of breath at this time. We'll continue outpatient regimen.   4. Diabetes. Agree with sliding scale insulin. He did have an episode of hypoglycemia, but this was likely secondary to decreased by mouth intake. He does appear to  be tolerating clear liquids at this time. Once his by mouth intake is more consistent, would restart Lantus.  CBG (last 3)   Recent Labs  12/31/12 0016 12/31/12 0413 12/31/12 0744  GLUCAP 116* 102* 114*       5. Hypothyroidism. Continue levothyroxine. Changed to PO.   6. Hypertension. Appears to be controlled on current regimen.   7. History of atrial fibrillation. Heart rate is currently controlled. He's not felt to be Coumadin candidate due to history of alcohol abuse.   8. History of alcohol abuse. Patient reports he's not had any alcohol for the past few months. No signs of withdrawal.   9. Partial small bowel obstruction. Per surgery. Clinically he appears to be improving. He is having bowel movements and tolerating clear liquids. NG tube has been discontinued.    10. History of CAD status post CABG. No chest pains or signs of ischemia. Restart aspirin.    DVT Prophylaxis    Heparin   Lab Results  Component Value Date   PLT 125* 12/30/2012    Medications  Scheduled Meds: . antiseptic oral rinse  15 mL Mouth Rinse q12n4p  . aspirin EC  325 mg Oral Daily  . chlorhexidine  15 mL  Mouth Rinse BID  . furosemide  40 mg Intravenous BID  . heparin  5,000 Units Subcutaneous Q8H  . insulin aspart  0-15 Units Subcutaneous Q4H  . levothyroxine  150 mcg Oral QAC breakfast  . metoprolol  5 mg Intravenous Q6H  . pantoprazole (PROTONIX) IV  40 mg Intravenous QHS  . potassium chloride  40 mEq Oral Daily   Continuous Infusions: . dextrose 5 % and 0.45% NaCl 1,000 mL with potassium chloride 20 mEq infusion 20 mL/hr at 12/31/12 0026   PRN Meds:.fluticasone, menthol-cetylpyridinium, morphine injection, ondansetron, phenol  Antibiotics     Anti-infectives   None       Time Spent in minutes   35   Susa Raring K M.D on 12/31/2012 at 8:28 AM  Between 7am to 7pm - Pager - [phone]  After 7pm go to [email or web address] - password [email or web address]  And look for the night  coverage person covering for me after hours  [facility] Office  [phone]    Subjective:   [name] today has, No headache, No chest pain, mild generalized abdominal pain - No Nausea, No new weakness tingling or numbness, No Cough - SOB.    Objective:   Filed Vitals:   12/30/12 0543 12/30/12 1423 12/30/12 2100 12/31/12 0530  BP: 145/74 139/72 105/52 136/81  Pulse: 76 78 62 71  Temp: 98.9 F (37.2 C) 98.7 F (37.1 C) 98.2 F (36.8 C) 97.7 F (36.5 C)  TempSrc: Oral Oral Oral   Resp: 18 18 18 18   Height:      Weight:      SpO2: 100% 98% 98% 94%    Wt Readings from Last 3 Encounters:  12/26/12 105.235 kg (232 lb)  12/19/12 101.379 kg (223 lb 8 oz)  11/13/12 116.03 kg (255 lb 12.8 oz)    No intake or output data in the 24 hours ending 12/31/12 0828  Exam Awake Alert, Oriented X 3, No new F.N deficits, Normal affect [place].AT,PERRAL Supple Neck,No JVD, No cervical lymphadenopathy appriciated.  Symmetrical Chest wall movement, Good air movement bilaterally, few rales RRR,No Gallops,Rubs or new Murmurs, No Parasternal Heave +ve B.Sounds, Abd Soft, Non tender, No organomegaly appriciated, No rebound - guarding or rigidity. No Cyanosis, Clubbing 2+ edema, No new Rash or bruise    Data Review   Micro Results No results found for this or any previous visit (from the past 240 hour(s)).  Radiology Reports   US Renal  12/29/2012  *RADIOLOGY REPORT*  Clinical Data: Acute renal failure  RENAL/URINARY TRACT ULTRASOUND COMPLETE  Comparison: CT abdomen 12/25/2012  Findings:  Right Kidney = 13.4 cm.  There is an anechoic 4.3 cm cyst in the upper pole.  No hydronephrosis.  Left kidney = 13.2 of cm.  There two anechoic cyst measuring 2 and 3 cm.  No hydronephrosis appear  Bladder:  Bladder partially distended.  No irregularity identified.  IMPRESSION:  1.  No evidence of hydronephrosis or renal obstruction.  2.  Bilateral simple appearing renal cysts are unchanged  from comparison CT.   Original Report Authenticated By: Genevive Bi, M.D.    Dg Abd 2 Views  12/29/2012  *RADIOLOGY REPORT*  Clinical Data: 67 year old male with recent bowel obstruction. Abdominal pain.  ABDOMEN - 2 VIEW  Comparison: 12/27/2012 and earlier.  Findings: Oral contrast in the transverse and left colon.  Sigmoid diverticulosis.  Decreased size of gas filled small bowel loops. Overall nonobstructed bowel gas pattern.  No pneumoperitoneum. Stable visualized osseous structures.  Multilevel disc  and endplate degeneration in the lumbar spine.  IMPRESSION: Improved, now nonobstructed bowel gas pattern.  No free air. Sigmoid diverticulosis.   Original Report Authenticated By: Erskine Speed, M.D.       CBC  Recent Labs Lab 12/25/12 1403 12/30/12 1202  WBC 8.5 5.4  HGB 12.6* 11.2*  HCT 40.0 34.2*  PLT 125* 125*  MCV 81.8 81.4  MCH 25.8* 26.7  MCHC 31.5 32.7  RDW 19.5* 19.5*  LYMPHSABS 0.8 1.3  MONOABS 0.6 0.8  EOSABS 0.0 0.3  BASOSABS 0.0 0.0    Chemistries   Recent Labs Lab 12/25/12 1403 12/26/12 0445 12/29/12 0905 12/30/12 0700 12/31/12 0605  NA 139 142 138 139 142  K 4.1 4.9 4.2 4.0 3.6  CL 96 99 101 102 104  CO2 32 29 25 26 26   GLUCOSE 204* 171* 101* 98 102*  BUN 57* 51* 61* 48* 36*  CREATININE 1.00 1.01 1.91* 1.42* 1.19  CALCIUM 10.5 10.3 9.4 9.2 9.0  AST 20  --   --   --   --   ALT 11  --   --   --   --   ALKPHOS 88  --   --   --   --   BILITOT 1.0  --   --   --   --    ------------------------------------------------------------------------------------------------------------------ estimated creatinine clearance is 71.2 ml/min (by C-G formula based on Cr of 1.19). ------------------------------------------------------------------------------------------------------------------ No results found for this basename: HGBA1C,  in the last 72  hours ------------------------------------------------------------------------------------------------------------------ No results found for this basename: CHOL, HDL, LDLCALC, TRIG, CHOLHDL, LDLDIRECT,  in the last 72 hours ------------------------------------------------------------------------------------------------------------------ No results found for this basename: TSH, T4TOTAL, FREET3, T3FREE, THYROIDAB,  in the last 72 hours ------------------------------------------------------------------------------------------------------------------ No results found for this basename: VITAMINB12, FOLATE, FERRITIN, TIBC, IRON, RETICCTPCT,  in the last 72 hours  Coagulation profile No results found for this basename: INR, PROTIME,  in the last 168 hours  No results found for this basename: DDIMER,  in the last 72 hours  Cardiac Enzymes  Recent Labs Lab 12/25/12 1422  TROPONINI <0.30   ------------------------------------------------------------------------------------------------------------------ No components found with this basename: POCBNP,

## 2012-12-31 NOTE — Progress Notes (Addendum)
Subjective: He continues to feel better.  Tolerating clear liquids  Objective: Vital signs in last 24 hours: Temp:  [97.7 F (36.5 C)-98.7 F (37.1 C)] 97.7 F (36.5 C) (03/23 0530) Pulse Rate:  [62-78] 71 (03/23 0530) Resp:  [18] 18 (03/23 0530) BP: (105-139)/(52-81) 136/81 mmHg (03/23 0530) SpO2:  [94 %-98 %] 94 % (03/23 0530) Last BM Date: 12/29/12  Intake/Output from previous day:   Intake/Output this shift:    General appearance: alert, cooperative and no distress Resp: nonlabored Cardio: normal rate GI: soft, nontender to my exam, ND, no peritoneal signs  Lab Results:   Recent Labs  12/30/12 1202  WBC 5.4  HGB 11.2*  HCT 34.2*  PLT 125*   BMET  Recent Labs  12/30/12 0700 12/31/12 0605  NA 139 142  K 4.0 3.6  CL 102 104  CO2 26 26  GLUCOSE 98 102*  BUN 48* 36*  CREATININE 1.42* 1.19  CALCIUM 9.2 9.0   PT/INR No results found for this basename: LABPROT, INR,  in the last 72 hours ABG No results found for this basename: PHART, PCO2, PO2, HCO3,  in the last 72 hours  Studies/Results: US Renal  12/29/2012  *RADIOLOGY REPORT*  Clinical Data: Acute renal failure  RENAL/URINARY TRACT ULTRASOUND COMPLETE  Comparison: CT abdomen 12/25/2012  Findings:  Right Kidney = 13.4 cm.  There is an anechoic 4.3 cm cyst in the upper pole.  No hydronephrosis.  Left kidney = 13.2 of cm.  There two anechoic cyst measuring 2 and 3 cm.  No hydronephrosis appear  Bladder:  Bladder partially distended.  No irregularity identified.  IMPRESSION:  1.  No evidence of hydronephrosis or renal obstruction.  2.  Bilateral simple appearing renal cysts are unchanged from comparison CT.   Original Report Authenticated By: Genevive Bi, M.D.    Dg Abd 2 Views  12/30/2012  *RADIOLOGY REPORT*  Clinical Data: Abdominal pain  ABDOMEN - 2 VIEW  Comparison: 12/17/2019 14  Findings: Mildly distended small bowel loops continue to improve. There are air-fluid levels on the decubitus view of  the small bowel.  No free air.  Lumbar scoliosis and spondylosis.  IMPRESSION: Improving small bowel dilatation.  No free air.   Original Report Authenticated By: Janeece Riggers, M.D.     Anti-infectives: Anti-infectives   None      Assessment/Plan: s/p * No surgery found * He continues to improve. xrays yesterday continue to show improvement and he is clinically better as well.  I explained that if he continues to do well and is discharged soon, then he will need further workup and evaluation likey by his primary doctor for possible cause of his bowel obstructions including possible endoscopy and follow up imaging.  He expressed understanding of this and said that he his regular doctor is [name]. We will try to advance diet and see how he does.   LOS: 6 days    [name] DAVID 12/31/2012  I just spoke with his daughter [name] at the patient request.  I explained that he seems to be improving but even so, I do not know for sure what has caused his symptoms.  I explained that if this truly was an obstruction, that malignancy could be a cause, especially in the absence of prior surgery or hernias.  This could have been other cause such as motility, enteritis, or other as well but I have recommended to the patient and the daughter that he have follow up with Dr.  Hollice Espy for further workup of possible cause.  I suggested possible endoscopy and short term repeat CT to evaluate.  She expressed understanding of this.

## 2013-01-01 DIAGNOSIS — I2510 Atherosclerotic heart disease of native coronary artery without angina pectoris: Secondary | ICD-10-CM

## 2013-01-01 LAB — GLUCOSE, CAPILLARY
Glucose-Capillary: 89 mg/dL (ref 70–99)
Glucose-Capillary: 128 mg/dL — ABNORMAL HIGH (ref 70–99)

## 2013-01-01 LAB — BASIC METABOLIC PANEL
BUN: 25 mg/dL — ABNORMAL HIGH (ref 6–23)
CO2: 25 mEq/L (ref 19–32)
Chloride: 104 mEq/L (ref 96–112)
GFR calc Af Amer: 84 mL/min — ABNORMAL LOW (ref >90)
Potassium: 4.3 mEq/L (ref 3.5–5.1)

## 2013-01-01 NOTE — Progress Notes (Signed)
Patient ID: [name]  male  ZOX:[id number]    DOB: 1945/11/07    DOA: 12/25/2012  PCP: [name], MD  Assessment/Plan: Acute renal failure: Resolved, no evidence of any hydronephrosis or obstruction. Continue Demadex and spironolactone as outpatient suction  Chronic diastolic CHF: Compensated, 2-D echo 11/13: EF 55-60%, he has pulmonary hypertension.  COPD with chronic respiratory failure: Continue O2 qhs and outpatient regimen, currently stable.  Diabetes: Continue sliding scale insulin, DC on home regimen  PSBO: Resolved  DVT Prophylaxis:  Code Status:  Disposition: ok to DC home per primary service    Subjective: No complaints, awaiting DC home  Objective: Weight change:   Intake/Output Summary (Last 24 hours) at 01/01/13 1340 Last data filed at 01/01/13 0926  Gross per 24 hour  Intake    300 ml  Output      0 ml  Net    300 ml   Blood pressure 127/55, pulse 76, temperature 97.8 F (36.6 C), temperature source Oral, resp. rate 17, height 5\' 10"  (1.778 m), weight 105.235 kg (232 lb), SpO2 99.00%.  Physical Exam: General: Alert and awake, oriented x3, not in any acute distress. CVS: S1-S2 clear, no murmur rubs or gallops Chest: clear to auscultation bilaterally, no wheezing, rales or rhonchi Abdomen: soft nontender, nondistended, normal bowel sounds Extremities: no cyanosis, clubbing or edema noted bilaterally  Lab Results: Basic Metabolic Panel:  Recent Labs Lab 12/31/12 0605 01/01/13 0516  NA 142 139  K 3.6 4.3  CL 104 104  CO2 26 25  GLUCOSE 102* 113*  BUN 36* 25*  CREATININE 1.19 1.03  CALCIUM 9.0 9.1   Liver Function Tests:  Recent Labs Lab 12/25/12 1403  AST 20  ALT 11  ALKPHOS 88  BILITOT 1.0  PROT 9.1*  ALBUMIN 4.2    Recent Labs Lab 12/25/12 1452  LIPASE 41   No results found for this basename: AMMONIA,  in the last 168 hours CBC:  Recent Labs Lab 12/25/12 1403 12/30/12 1202  WBC 8.5 5.4  NEUTROABS 7.1 3.0  HGB  12.6* 11.2*  HCT 40.0 34.2*  MCV 81.8 81.4  PLT 125* 125*   Cardiac Enzymes:  Recent Labs Lab 12/25/12 1422  TROPONINI <0.30   BNP: No components found with this basename: POCBNP,  CBG:  Recent Labs Lab 12/31/12 2015 01/01/13 0012 01/01/13 0428 01/01/13 0749 01/01/13 1200  GLUCAP 141* 132* 128* 89 128*     Micro Results: No results found for this or any previous visit (from the past 240 hour(s)).  Studies/Results: Ct Abdomen Pelvis W Contrast  12/25/2012  *RADIOLOGY REPORT*  Clinical Data: Lower abdominal pain for 2 days with vomiting. Hypertension with hyperlipidemia.  CT ABDOMEN AND PELVIS WITH CONTRAST  Technique:  Multidetector CT imaging of the abdomen and pelvis was performed following the standard protocol during bolus administration of intravenous contrast.  Contrast: 80mL OMNIPAQUE IOHEXOL 300 MG/ML  SOLN  Comparison: 09/07/2012.  Findings: Fluid filled dilated loops of small bowel to the level of the proximal ileum.  At this level there is a loop of small bowel which has contents which appear fecal like (can be seen with malabsorption syndrome) at which point there is an abrupt change caliber of small bowel.  Cause is indeterminate as a discrete hernia is not identified.  It is possible this represents small bowel obstruction from adhesions.  Fluid in the abdomen partially surrounds portions of bowel limiting evaluation for possibility of colitis and / or diverticulitis. There are scattered diverticula  throughout the left colon and sigmoid colon.  My suspicion is that the fluid is related to the small bowel abnormality or cirrhosis.  Bilateral renal lesions most of which appear to be cysts.  Some smaller lesions are too small to adequately characterize.  No hydronephrosis.  No worrisome focal splenic, adrenal or pancreatic lesion.  Kyphosis throughout the lumbar junction with degenerative changes without worrisome bony destructive lesion.  Subcutaneous lesions have improved  or remain the same and may be related to patient's prior trauma as per prior CT report.  Noncontrast filled views of the urinary bladder unremarkable.  IMPRESSION: Findings raise possibility of small bowel obstruction.  Cause is indeterminate.  Colonic diverticula.  Given the presence of ascites, it is difficult to exclude or evaluate for the possibility of diverticulitis/colitis however, my suspicion is that fluid is related to small bowel abnormality or cirrhosis rather than primary colonic abnormality.  Please see above.   Original Report Authenticated By: Lacy Duverney, M.D.    US Renal  12/29/2012  *RADIOLOGY REPORT*  Clinical Data: Acute renal failure  RENAL/URINARY TRACT ULTRASOUND COMPLETE  Comparison: CT abdomen 12/25/2012  Findings:  Right Kidney = 13.4 cm.  There is an anechoic 4.3 cm cyst in the upper pole.  No hydronephrosis.  Left kidney = 13.2 of cm.  There two anechoic cyst measuring 2 and 3 cm.  No hydronephrosis appear  Bladder:  Bladder partially distended.  No irregularity identified.  IMPRESSION:  1.  No evidence of hydronephrosis or renal obstruction.  2.  Bilateral simple appearing renal cysts are unchanged from comparison CT.   Original Report Authenticated By: Genevive Bi, M.D.    Dg Abd 2 Views  12/30/2012  *RADIOLOGY REPORT*  Clinical Data: Abdominal pain  ABDOMEN - 2 VIEW  Comparison: 12/17/2019 14  Findings: Mildly distended small bowel loops continue to improve. There are air-fluid levels on the decubitus view of the small bowel.  No free air.  Lumbar scoliosis and spondylosis.  IMPRESSION: Improving small bowel dilatation.  No free air.   Original Report Authenticated By: Janeece Riggers, M.D.    Dg Abd 2 Views  12/29/2012  *RADIOLOGY REPORT*  Clinical Data: 67 year old male with recent bowel obstruction. Abdominal pain.  ABDOMEN - 2 VIEW  Comparison: 12/27/2012 and earlier.  Findings: Oral contrast in the transverse and left colon.  Sigmoid diverticulosis.  Decreased size of  gas filled small bowel loops. Overall nonobstructed bowel gas pattern.  No pneumoperitoneum. Stable visualized osseous structures.  Multilevel disc and endplate degeneration in the lumbar spine.  IMPRESSION: Improved, now nonobstructed bowel gas pattern.  No free air. Sigmoid diverticulosis.   Original Report Authenticated By: Erskine Speed, M.D.    Dg Abd 2 Views  12/27/2012  *RADIOLOGY REPORT*  Clinical Data: Abdominal pain.  Partial small bowel obstruction.  ABDOMEN - 2 VIEW  Comparison: 03/18 and 12/25/2012  Findings: There is persistent dilatation of multiple small bowel loops in the left side of the abdomen.  Some of the CT contrast has passed into the nondistended ascending colon.  The findings are consistent with persistent partial small bowel obstruction.  The stomach is not distended.  Contrast is present in the normal appearing bladder.  IMPRESSION: Persistent partial small bowel obstruction.   Original Report Authenticated By: Francene Boyers, M.D.    Dg Abd 2 Views  12/26/2012  *RADIOLOGY REPORT*  Clinical Data: SBO  ABDOMEN - 2 VIEW  Comparison: Yesterday  Findings: Dilated small bowel loops with air-fluid levels and increased  in caliber.  Prominent stool burden in the proximal colon.  There is no free intraperitoneal gas on the left decubitus image.  IMPRESSION: Worsening partial small bowel obstruction pattern.  No free intraperitoneal gas.   Original Report Authenticated By: Jolaine Click, M.D.    Dg Abd Acute W/chest  12/25/2012  *RADIOLOGY REPORT*  Clinical Data: Abdominal pain.  Nausea and vomiting.  ACUTE ABDOMEN SERIES (ABDOMEN 2 VIEW & CHEST 1 VIEW)  Comparison: Chest radiograph on 09/03/2012  Findings: A few mildly dilated small bowel loops are seen in the central abdomen which have air fluid levels.  Some gas and stool is seen throughout majority the colon.  A partial small bowel obstruction cannot be excluded.  There is no evidence of free air. Vascular calcification is noted, but no  radiopaque calculi identified.  Mild cardiomegaly is stable.  Low lung volumes again noted.  No evidence of acute infiltrate or edema.  No evidence of pleural effusion.  Prior CABG again noted.  IMPRESSION:  1.  Mildly dilated small bowel loops.  Partial small bowel obstruction cannot be excluded. 2.  Cardiomegaly low lung volumes.  No active lung disease.   Original Report Authenticated By: Myles Rosenthal, M.D.     Medications: Scheduled Meds: . antiseptic oral rinse  15 mL Mouth Rinse q12n4p  . aspirin EC  325 mg Oral Daily  . chlorhexidine  15 mL Mouth Rinse BID  . heparin  5,000 Units Subcutaneous Q8H  . insulin aspart  0-15 Units Subcutaneous Q4H  . levothyroxine  150 mcg Oral QAC breakfast  . metoprolol  5 mg Intravenous Q6H  . pantoprazole  40 mg Oral Daily  . potassium chloride  40 mEq Oral Daily  . spironolactone  25 mg Oral Daily  . torsemide  80 mg Oral Daily      LOS: 7 days   Ory Elting M.D. [facility] 01/01/2013, 1:40 PM Pager: [phone]  If 7PM-7AM, please contact night-coverage [email or web address] Password [email or web address]

## 2013-01-01 NOTE — Discharge Summary (Signed)
Patient ID: TAHJI Asher MRN: [id number] DOB/AGE: 69-Jun-1947 69 y.o.  Admit date: 12/25/2012 Discharge date: 01/01/2013  Procedures: none  Consults: internal medicine  Reason for Admission: patient is a 67 year old male who presents to the emergency room due to abdominal pain. He states the pain started gradually about 48 hours ago. It came on after he was straining at bowel movement. The patient has chronic constipation likely secondary to chronic narcotic use for pain and takes MiraLAX daily. After having to strain after a bowel movement he developed the gradual onset of pain in his mid abdomen. He states the pain has gotten steadily worse over the last 2 days. He describes an aching or cramping pain that is constant but there are exacerbations when he gets more severe. He has been nauseated and had one episode of vomiting just before getting to the emergency room today. He describes that as bilious. No melena or hematochezia or hematemesis. The pain is in his midabdomen and does not radiate. He denies any history of any similar symptoms or any chronic or recurring abdominal pain. No fever or chills. No exacerbating or alleviating factors.  Admission Diagnoses:  partial small bowel obstruction  Patient Active Problem List  Diagnosis  . DM  . HYPERLIPIDEMIA  . OBSTRUCTIVE SLEEP APNEA  . HYPERTENSION  . DIASTOLIC HEART FAILURE, CHRONIC  . CAROTID STENOSIS  . CHRONIC OBSTRUCTIVE PULMONARY DISEASE  . EDEMA  . Dyspnea  . CAD  . ETOH abuse  . Atrial fibrillation  . Renal insufficiency  . NSTEMI (non-ST elevated myocardial infarction)  . Hematoma-on back, sacral area, right chest wall post fall  . Acute renal failure  . Weakness generalized  . Hypothyroidism  . Acute respiratory failure with hypoxia  . Elevated troponin  . Elevated LFTs  . Acute on chronic diastolic CHF (congestive heart failure)  . Pseudomonas urinary tract infection  . Hypokalemia  . L1 vertebral fracture  .  Physical deconditioning  . Seasonal allergies   Hospital Course: The patient was admitted and placed on bowel rest.  The following day he had some emesis and an NGT was placed.  This was kept in for 3 days.  He began passing flatus and it was able to be clamped and then removed.  His diet was able to be advanced as tolerated.    He did have some acute renal failure.  The hospitalist were consulted and assisted with his medical issues.  The ARF resolved by time of discharge.  PE: Abd: soft, NT, ND, obese, +BS  Discharge Diagnoses:  PSBO, resolved ARF, resolved Patient Active Problem List  Diagnosis  . DM  . HYPERLIPIDEMIA  . OBSTRUCTIVE SLEEP APNEA  . HYPERTENSION  . DIASTOLIC HEART FAILURE, CHRONIC  . CAROTID STENOSIS  . CHRONIC OBSTRUCTIVE PULMONARY DISEASE  . EDEMA  . Dyspnea  . CAD  . ETOH abuse  . Atrial fibrillation  . Renal insufficiency  . NSTEMI (non-ST elevated myocardial infarction)  . Hematoma-on back, sacral area, right chest wall post fall  . Acute renal failure  . Weakness generalized  . Hypothyroidism  . Acute respiratory failure with hypoxia  . Elevated troponin  . Elevated LFTs  . Acute on chronic diastolic CHF (congestive heart failure)  . Pseudomonas urinary tract infection  . Hypokalemia  . L1 vertebral fracture  . Physical deconditioning  . Seasonal allergies     Discharge Medications:   Medication List    TAKE these medications       aspirin  EC 325 MG tablet  Take 325 mg by mouth daily.     carvedilol 3.125 MG tablet  Commonly known as:  COREG  Take 3.125 mg by mouth 2 (two) times daily with a meal. Take with carvedilol 6.25mg  for a total of 9.375mg      carvedilol 6.25 MG tablet  Commonly known as:  COREG  Take 6.25 mg by mouth 2 (two) times daily with a meal. Take with 3.125mg  for a total of 9.375mg      FLUoxetine 20 MG capsule  Commonly known as:  PROZAC  Take 20 mg by mouth daily.     fluticasone 220 MCG/ACT inhaler  Commonly  known as:  FLOVENT HFA  Inhale 1 puff into the lungs 2 (two) times daily as needed (for shortness of breath).     folic acid 1 MG tablet  Commonly known as:  FOLVITE  Take 1 tablet (1 mg total) by mouth daily.     gabapentin 300 MG capsule  Commonly known as:  NEURONTIN  Take 1 capsule (300 mg total) by mouth daily.     insulin glargine 100 UNIT/ML injection  Commonly known as:  LANTUS  Inject 10 Units into the skin at bedtime.     lactulose 10 GM/15ML solution  Commonly known as:  CHRONULAC  Take 45 mLs (30 g total) by mouth daily.     levothyroxine 150 MCG tablet  Commonly known as:  SYNTHROID, LEVOTHROID  Take 150 mcg by mouth daily.     multivitamin capsule  Take 1 capsule by mouth daily.     omeprazole 20 MG capsule  Commonly known as:  PRILOSEC  Take 20 mg by mouth daily.     oxyCODONE 5 MG immediate release tablet  Commonly known as:  Oxy IR/ROXICODONE  Take 5 mg by mouth every 6 (six) hours as needed for pain.     senna 8.6 MG tablet  Commonly known as:  SENOKOT  Take 2 tablets by mouth daily as needed for constipation.     simvastatin 40 MG tablet  Commonly known as:  ZOCOR  Take 40 mg by mouth at bedtime.     sodium chloride 0.65 % Soln nasal spray  Commonly known as:  OCEAN  Place 1 spray into the nose as needed for congestion.     spironolactone 25 MG tablet  Commonly known as:  ALDACTONE  Take 1 tablet (25 mg total) by mouth daily.     torsemide 20 MG tablet  Commonly known as:  DEMADEX  Take 4 tablets (80 mg total) by mouth daily.        Discharge Instructions:     Follow-up Information   Follow up with [name], MD. (As needed)    Contact information:   [address] [place] [address] [phone]       Signed: Letha Cape 01/01/2013, 9:48 AM

## 2013-01-02 ENCOUNTER — Telehealth (HOSPITAL_COMMUNITY): Payer: Self-pay | Admitting: Cardiology

## 2013-01-02 NOTE — Telephone Encounter (Signed)
Spoke w/Brittany gave ok to resume care

## 2013-01-02 NOTE — Discharge Summary (Signed)
Vander Kueker, MD, MPH, FACS Pager: [phone]  

## 2013-01-02 NOTE — Telephone Encounter (Signed)
Please call with verbal to restart services for this pt. He was receiving nursing, PT, and OT. Services were stopped because pt was in the hospital from 3/17-3/24.   thanks

## 2013-01-10 ENCOUNTER — Encounter (INDEPENDENT_AMBULATORY_CARE_PROVIDER_SITE_OTHER): Payer: Self-pay

## 2013-02-01 ENCOUNTER — Telehealth: Payer: Self-pay

## 2013-02-01 NOTE — Telephone Encounter (Signed)
Victorino Dike an occupational therapist with home health called to get orders for home health evaluation and treatment.  Advised her that was fine this time but non other since patient has never been seen.  She will let family the no.

## 2013-02-13 ENCOUNTER — Other Ambulatory Visit (HOSPITAL_COMMUNITY): Payer: Self-pay | Admitting: Cardiovascular Disease

## 2013-02-13 ENCOUNTER — Other Ambulatory Visit: Payer: Self-pay

## 2013-02-13 MED ORDER — CARVEDILOL 3.125 MG PO TABS: 3.1250 mg | ORAL_TABLET | Freq: Two times a day (BID) | ORAL | Status: DC

## 2013-02-13 NOTE — Telephone Encounter (Signed)
Pt's daughter called, she states pt picked up his med (carvedilol 3.125 mg) yesterday and has lost it, she isn't sure if it just fell out of his car or what happened but they have searched all over and can not find it, she is asking for a refill be sent in and they will pay for it out of pocket, rx sent to [facility] in [facility]

## 2013-02-19 ENCOUNTER — Ambulatory Visit (HOSPITAL_COMMUNITY)
Admission: RE | Admit: 2013-02-19 | Discharge: 2013-02-19 | Disposition: A | Discharge status: Home / Self Care | Payer: Medicare Other | Source: Ambulatory Visit | Attending: Internal Medicine | Admitting: Internal Medicine

## 2013-02-19 ENCOUNTER — Encounter (HOSPITAL_COMMUNITY): Payer: Self-pay

## 2013-02-19 VITALS — BP 128/70 | HR 63 | Wt 249.8 lb

## 2013-02-19 DIAGNOSIS — I5032 Chronic diastolic (congestive) heart failure: Secondary | ICD-10-CM | POA: Insufficient documentation

## 2013-02-19 DIAGNOSIS — I509 Heart failure, unspecified: Secondary | ICD-10-CM | POA: Insufficient documentation

## 2013-02-19 DIAGNOSIS — G4733 Obstructive sleep apnea (adult) (pediatric): Secondary | ICD-10-CM | POA: Insufficient documentation

## 2013-02-19 DIAGNOSIS — I6529 Occlusion and stenosis of unspecified carotid artery: Secondary | ICD-10-CM | POA: Insufficient documentation

## 2013-02-19 DIAGNOSIS — E785 Hyperlipidemia, unspecified: Secondary | ICD-10-CM | POA: Insufficient documentation

## 2013-02-19 DIAGNOSIS — Z951 Presence of aortocoronary bypass graft: Secondary | ICD-10-CM | POA: Insufficient documentation

## 2013-02-19 DIAGNOSIS — J449 Chronic obstructive pulmonary disease, unspecified: Secondary | ICD-10-CM | POA: Insufficient documentation

## 2013-02-19 DIAGNOSIS — I2510 Atherosclerotic heart disease of native coronary artery without angina pectoris: Secondary | ICD-10-CM | POA: Insufficient documentation

## 2013-02-19 DIAGNOSIS — J4489 Other specified chronic obstructive pulmonary disease: Secondary | ICD-10-CM | POA: Insufficient documentation

## 2013-02-19 DIAGNOSIS — Z7982 Long term (current) use of aspirin: Secondary | ICD-10-CM | POA: Insufficient documentation

## 2013-02-19 DIAGNOSIS — I4891 Unspecified atrial fibrillation: Secondary | ICD-10-CM | POA: Insufficient documentation

## 2013-02-19 DIAGNOSIS — Z79899 Other long term (current) drug therapy: Secondary | ICD-10-CM | POA: Insufficient documentation

## 2013-02-19 DIAGNOSIS — F1010 Alcohol abuse, uncomplicated: Secondary | ICD-10-CM | POA: Insufficient documentation

## 2013-02-19 DIAGNOSIS — Z9181 History of falling: Secondary | ICD-10-CM | POA: Insufficient documentation

## 2013-02-19 DIAGNOSIS — E039 Hypothyroidism, unspecified: Secondary | ICD-10-CM | POA: Insufficient documentation

## 2013-02-19 DIAGNOSIS — E119 Type 2 diabetes mellitus without complications: Secondary | ICD-10-CM | POA: Insufficient documentation

## 2013-02-19 DIAGNOSIS — I10 Essential (primary) hypertension: Secondary | ICD-10-CM | POA: Insufficient documentation

## 2013-02-19 NOTE — Patient Instructions (Addendum)
Take metolazone 2.5 mg for two days along with torsemide.  Follow up in 2 months.  Call if weight starts going up [phone].

## 2013-02-19 NOTE — Assessment & Plan Note (Signed)
He has mild to moderate fluid overload not responding very well to extra torsemide. Will give metolazone 2.5mg  x 2 days. Reinforced for dietary restriction and avoid ETOH. Reinforced need for daily weights and reviewed use of sliding scale diuretics. Will check blood work through [employer]. Will see if we can extend Home health for another month due to fluid overload.

## 2013-02-19 NOTE — Progress Notes (Signed)
Patient ID: [name], male   DOB: [birth year], 67 y.o.   MRN: [id number] [name] - PCP at [facility]  Weight Range   270 pounds  Baseline proBNP   4000 09/21/12    HPI: Mr. Sweetman is a 67 yo male with PMX s/f CAD (s/p CABG x 4 in 2001, normal Myoview 2010), chronic diastolic CHF, persistent a-fib, DM, HTN, HL, carotid artery disease, COPD, hypothyroidism, EtOH abuse, chronic venous insufficiency and morbid obesity.  He is not anticoagulants due to ETOH abuse and fall risk.   Echo 09/2011: LVEF 45-50%, mod biatral enlargement, moderate RV dilatation, moderate TR, PASP 66 mmHg.  ECHO 08/2012 EF 55-60% moderate bilateral RA/LA enlargement  Admitted 08/30/12 for acute on chronic diastolic CHF.  Diuresed with discharge weight of 276 pounds.    Follow up: Daughter present. Brother died last week and has started increased drinking 3 beers a day with salt in it. Since last visit was admitted for bowel obstruction and had NGT placed. Abdominal pain gone. Weight at home has been gradually increasing, 244-246 lbs. Not eating as well as he was at previous visit. Trying to stick to low salt diet. Walking with walker at home. Denies SOB, PND, or CP. Wears O2 at night. Occasional dizziness with turning his head. Wearing ACE wraps bilateral legs. Has taken extra demadex.      ROS: All systems negative except as listed in HPI, PMH and Problem List.  Past Medical History  Diagnosis Date  . CHRONIC OBSTRUCTIVE PULMONARY DISEASE 06/20/2009  . OBSTRUCTIVE SLEEP APNEA 06/20/2009  . CAROTID STENOSIS 06/20/2009    A. 08/2001 s/p L CEA;  B.   09/14/11 - Carotid U/S - 40-59% bilateral stenosis, left CEA patch angioplasty is patent  . DM 06/20/2009  . CAD 06/20/2009    A.  08/2000 - s/p CABG x 4 - LIMA-LAD, Left Radial-OM, VG-DIAG, VG-RCA;  B. Neg. MV  2010  . HYPERLIPIDEMIA 06/20/2009  . HYPERTENSION 06/20/2009  . Hypothyroidism   . Low back pain   . Asthma     as child  . Pneumonia   . Atrial  fibrillation     Not felt to be coumadin candidate 2/2 ETOH use.  Marland Kitchen Hypothyroidism   . ETOH abuse   . History of tobacco abuse     remote - quit 1970  . Bilateral renal cysts   . Marijuana abuse   . Morbidly obese   . CAD (coronary artery disease)   . CHF (congestive heart failure)   . Falls frequently     Current Outpatient Prescriptions  Medication Sig Dispense Refill  . aspirin EC 325 MG tablet Take 325 mg by mouth daily.      . carvedilol (COREG) 3.125 MG tablet Take 1 tablet (3.125 mg total) by mouth 2 (two) times daily with a meal. Take with carvedilol 6.25mg  for a total of 9.375mg   60 tablet  1  . FLUoxetine (PROZAC) 20 MG capsule Take 20 mg by mouth daily.        . fluticasone (FLOVENT HFA) 220 MCG/ACT inhaler Inhale 1 puff into the lungs 2 (two) times daily as needed (for shortness of breath).       . folic acid (FOLVITE) 1 MG tablet Take 1 tablet (1 mg total) by mouth daily.      Marland Kitchen gabapentin (NEURONTIN) 300 MG capsule Take 1 capsule (300 mg total) by mouth daily.      . insulin glargine (LANTUS) 100 UNIT/ML injection  Inject 10 Units into the skin at bedtime.  10 mL  1  . lactulose (CHRONULAC) 10 GM/15ML solution Take 45 mLs (30 g total) by mouth daily.  240 mL    . levothyroxine (SYNTHROID, LEVOTHROID) 150 MCG tablet Take 150 mcg by mouth daily.       . Multiple Vitamin (MULTIVITAMIN) capsule Take 1 capsule by mouth daily.      Marland Kitchen omeprazole (PRILOSEC) 20 MG capsule Take 20 mg by mouth daily.        Marland Kitchen oxyCODONE (OXY IR/ROXICODONE) 5 MG immediate release tablet Take 5 mg by mouth every 6 (six) hours as needed for pain.      Marland Kitchen senna (SENOKOT) 8.6 MG tablet Take 2 tablets by mouth daily as needed for constipation.       . simvastatin (ZOCOR) 40 MG tablet Take 40 mg by mouth at bedtime.        . sodium chloride (OCEAN) 0.65 % SOLN nasal spray Place 1 spray into the nose as needed for congestion.      Marland Kitchen spironolactone (ALDACTONE) 25 MG tablet Take 1 tablet (25 mg total) by mouth  daily.  30 tablet  6  . torsemide (DEMADEX) 20 MG tablet Take 4 tablets (80 mg total) by mouth daily.  150 tablet  3   No current facility-administered medications for this encounter.     PHYSICAL EXAM: Filed Vitals:   02/19/13 1359  BP: 128/70  Pulse: 63  Weight: 249 lb 12 oz (113.286 kg)  SpO2: 93%    General:  Chronically ill appearing. No resp difficulty HEENT: normal Neck: supple. JVP 7-8 appears flat  Carotids 2+ bilaterally; no bruits. No lymphadenopathy or thryomegaly appreciated. Cor: PMI normal. Regular rate & rhythm. No rubs, gallops. 1/6 SEM RUSB Lungs: CTA  Abdomen: obese, soft, nontender, nondistended. No hepatosplenomegaly. No bruits or masses. Good bowel sounds. Extremities: no cyanosis, clubbing, rash, tr edema with erythema no open wounds (not wrapped today) Neuro: alert & orientedx3, cranial nerves grossly intact. Moves all 4 extremities w/o difficulty. Affect pleasant.    ASSESSMENT & PLAN:

## 2013-02-19 NOTE — Addendum Note (Signed)
Encounter addended by: Aundria Rud, NP on: 02/19/2013  3:02 PM<BR>     Documentation filed: Patient Instructions Section

## 2013-02-19 NOTE — Assessment & Plan Note (Signed)
No evidence of ischemia. Follows with Dr. Clifton James. If renal function stable may benefit from initiating an ACE-I.

## 2013-02-26 ENCOUNTER — Telehealth (HOSPITAL_COMMUNITY): Payer: Self-pay | Admitting: *Deleted

## 2013-02-26 NOTE — Telephone Encounter (Signed)
Received labs bun 69 cr 1.3, per Ulyess Blossom, PA if wt is stable hold Torsemide for 2 days, spoke w/pt's daughter she states pt's wt is stable she will hold Torsemide on 5/20 if wt stable on 5/21 will hold then if wt increasing will restart

## 2013-03-03 ENCOUNTER — Other Ambulatory Visit (HOSPITAL_COMMUNITY): Payer: Self-pay | Admitting: Adult Health

## 2013-03-08 ENCOUNTER — Encounter: Payer: Self-pay | Admitting: Internal Medicine

## 2013-03-24 ENCOUNTER — Other Ambulatory Visit (HOSPITAL_COMMUNITY): Payer: Self-pay | Admitting: Cardiovascular Disease

## 2013-04-11 ENCOUNTER — Telehealth: Payer: Self-pay

## 2013-04-11 NOTE — Telephone Encounter (Signed)
[name] with care center called to get orders to have home health do bed baths for patient.  Informed [name] we have not see patient since November and she would need to get orders from PCP Dr Hollice Espy.  She understood.

## 2013-04-14 ENCOUNTER — Inpatient Hospital Stay (HOSPITAL_COMMUNITY)
Admission: EM | Admit: 2013-04-14 | Discharge: 2013-04-27 | DRG: 292 | Disposition: A | Discharge status: Skilled Nursing Facility | Payer: Medicare Other | Attending: Internal Medicine | Admitting: Internal Medicine

## 2013-04-14 ENCOUNTER — Emergency Department (HOSPITAL_COMMUNITY): Payer: Medicare Other

## 2013-04-14 ENCOUNTER — Encounter (HOSPITAL_COMMUNITY): Payer: Self-pay | Admitting: Emergency Medicine

## 2013-04-14 DIAGNOSIS — E785 Hyperlipidemia, unspecified: Secondary | ICD-10-CM | POA: Diagnosis present

## 2013-04-14 DIAGNOSIS — Z91199 Patient's noncompliance with other medical treatment and regimen due to unspecified reason: Secondary | ICD-10-CM

## 2013-04-14 DIAGNOSIS — Z9119 Patient's noncompliance with other medical treatment and regimen: Secondary | ICD-10-CM

## 2013-04-14 DIAGNOSIS — F1010 Alcohol abuse, uncomplicated: Secondary | ICD-10-CM

## 2013-04-14 DIAGNOSIS — R601 Generalized edema: Secondary | ICD-10-CM

## 2013-04-14 DIAGNOSIS — J449 Chronic obstructive pulmonary disease, unspecified: Secondary | ICD-10-CM | POA: Diagnosis present

## 2013-04-14 DIAGNOSIS — Z9181 History of falling: Secondary | ICD-10-CM

## 2013-04-14 DIAGNOSIS — E870 Hyperosmolality and hypernatremia: Secondary | ICD-10-CM | POA: Diagnosis present

## 2013-04-14 DIAGNOSIS — I872 Venous insufficiency (chronic) (peripheral): Secondary | ICD-10-CM | POA: Diagnosis present

## 2013-04-14 DIAGNOSIS — E119 Type 2 diabetes mellitus without complications: Secondary | ICD-10-CM | POA: Diagnosis present

## 2013-04-14 DIAGNOSIS — E871 Hypo-osmolality and hyponatremia: Secondary | ICD-10-CM | POA: Diagnosis present

## 2013-04-14 DIAGNOSIS — J4489 Other specified chronic obstructive pulmonary disease: Secondary | ICD-10-CM | POA: Diagnosis present

## 2013-04-14 DIAGNOSIS — I5033 Acute on chronic diastolic (congestive) heart failure: Principal | ICD-10-CM

## 2013-04-14 DIAGNOSIS — E875 Hyperkalemia: Secondary | ICD-10-CM | POA: Diagnosis present

## 2013-04-14 DIAGNOSIS — I480 Paroxysmal atrial fibrillation: Secondary | ICD-10-CM | POA: Diagnosis present

## 2013-04-14 DIAGNOSIS — E039 Hypothyroidism, unspecified: Secondary | ICD-10-CM | POA: Diagnosis present

## 2013-04-14 DIAGNOSIS — M793 Panniculitis, unspecified: Secondary | ICD-10-CM

## 2013-04-14 DIAGNOSIS — G4733 Obstructive sleep apnea (adult) (pediatric): Secondary | ICD-10-CM | POA: Diagnosis present

## 2013-04-14 DIAGNOSIS — N179 Acute kidney failure, unspecified: Secondary | ICD-10-CM

## 2013-04-14 DIAGNOSIS — Z87891 Personal history of nicotine dependence: Secondary | ICD-10-CM

## 2013-04-14 DIAGNOSIS — I4891 Unspecified atrial fibrillation: Secondary | ICD-10-CM

## 2013-04-14 DIAGNOSIS — I509 Heart failure, unspecified: Secondary | ICD-10-CM | POA: Diagnosis present

## 2013-04-14 DIAGNOSIS — I2510 Atherosclerotic heart disease of native coronary artery without angina pectoris: Secondary | ICD-10-CM | POA: Diagnosis present

## 2013-04-14 DIAGNOSIS — Z951 Presence of aortocoronary bypass graft: Secondary | ICD-10-CM

## 2013-04-14 DIAGNOSIS — L259 Unspecified contact dermatitis, unspecified cause: Secondary | ICD-10-CM | POA: Diagnosis present

## 2013-04-14 DIAGNOSIS — Z794 Long term (current) use of insulin: Secondary | ICD-10-CM

## 2013-04-14 DIAGNOSIS — Z6841 Body Mass Index (BMI) 40.0 and over, adult: Secondary | ICD-10-CM

## 2013-04-14 DIAGNOSIS — E876 Hypokalemia: Secondary | ICD-10-CM | POA: Diagnosis present

## 2013-04-14 LAB — MRSA PCR SCREENING: MRSA by PCR: NEGATIVE

## 2013-04-14 LAB — COMPREHENSIVE METABOLIC PANEL
ALT: 16 U/L (ref 0–53)
Alkaline Phosphatase: 92 U/L (ref 39–117)
BUN: 76 mg/dL — ABNORMAL HIGH (ref 6–23)
Chloride: 92 mEq/L — ABNORMAL LOW (ref 96–112)
GFR calc Af Amer: 54 mL/min — ABNORMAL LOW (ref >90)
Glucose, Bld: 121 mg/dL — ABNORMAL HIGH (ref 70–99)
Potassium: 5.8 mEq/L — ABNORMAL HIGH (ref 3.5–5.1)
Sodium: 131 mEq/L — ABNORMAL LOW (ref 135–145)
Total Bilirubin: 0.7 mg/dL (ref 0.3–1.2)
Total Protein: 7.4 g/dL (ref 6.0–8.3)

## 2013-04-14 LAB — CBC
MCH: 26.6 pg (ref 26.0–34.0)
MCHC: 31.1 g/dL (ref 30.0–36.0)
RDW: 15.7 % — ABNORMAL HIGH (ref 11.5–15.5)

## 2013-04-14 LAB — PROTIME-INR
INR: 1.28 (ref 0.00–1.49)
Prothrombin Time: 15.7 seconds — ABNORMAL HIGH (ref 11.6–15.2)

## 2013-04-14 LAB — CBC WITH DIFFERENTIAL/PLATELET
Eosinophils Absolute: 0.2 10*3/uL (ref 0.0–0.7)
Hemoglobin: 11.3 g/dL — ABNORMAL LOW (ref 13.0–17.0)
Lymphocytes Relative: 21 % (ref 12–46)
Lymphs Abs: 1.5 10*3/uL (ref 0.7–4.0)
MCH: 26.8 pg (ref 26.0–34.0)
Monocytes Relative: 13 % — ABNORMAL HIGH (ref 3–12)
Neutro Abs: 4.4 10*3/uL (ref 1.7–7.7)
Neutrophils Relative %: 63 % (ref 43–77)
Platelets: 126 10*3/uL — ABNORMAL LOW (ref 150–400)
RBC: 4.21 MIL/uL — ABNORMAL LOW (ref 4.22–5.81)
WBC: 7 10*3/uL (ref 4.0–10.5)

## 2013-04-14 LAB — PRO B NATRIURETIC PEPTIDE: Pro B Natriuretic peptide (BNP): 6847 pg/mL — ABNORMAL HIGH (ref 0–125)

## 2013-04-14 LAB — CREATININE, SERUM
Creatinine, Ser: 1.35 mg/dL (ref 0.50–1.35)
GFR calc non Af Amer: 52 mL/min — ABNORMAL LOW (ref >90)

## 2013-04-14 LAB — APTT: aPTT: 33 seconds (ref 24–37)

## 2013-04-14 MED ORDER — LEVOTHYROXINE SODIUM 150 MCG PO TABS
150.0000 ug | ORAL_TABLET | Freq: Every day | ORAL | Status: DC
  Administered 2013-04-15 – 2013-04-20 (×6): 150 ug via ORAL
  Administered 2013-04-21: 10:00:00 via ORAL
  Administered 2013-04-22 – 2013-04-27 (×6): 150 ug via ORAL
  Filled 2013-04-14 (×14): qty 1

## 2013-04-14 MED ORDER — INSULIN GLARGINE 100 UNIT/ML ~~LOC~~ SOLN
10.0000 [IU] | Freq: Every day | SUBCUTANEOUS | Status: DC
  Administered 2013-04-14 – 2013-04-26 (×13): 10 [IU] via SUBCUTANEOUS
  Filled 2013-04-14 (×14): qty 0.1

## 2013-04-14 MED ORDER — ENOXAPARIN SODIUM 40 MG/0.4ML ~~LOC~~ SOLN: 40.0000 mg | SUBCUTANEOUS | Status: DC

## 2013-04-14 MED ORDER — MULTIVITAMINS PO CAPS: 1.0000 | ORAL_CAPSULE | Freq: Every day | ORAL | Status: DC

## 2013-04-14 MED ORDER — ADULT MULTIVITAMIN W/MINERALS CH
1.0000 | ORAL_TABLET | Freq: Every day | ORAL | Status: DC
  Administered 2013-04-15 – 2013-04-27 (×14): 1 via ORAL
  Filled 2013-04-14 (×14): qty 1

## 2013-04-14 MED ORDER — FUROSEMIDE 10 MG/ML IJ SOLN
30.0000 mg/h | INTRAMUSCULAR | Status: DC
  Administered 2013-04-14: 15 mg/h via INTRAVENOUS
  Administered 2013-04-15: 20 mg/h via INTRAVENOUS
  Administered 2013-04-15: 15 mg/h via INTRAVENOUS
  Administered 2013-04-16 – 2013-04-18 (×5): 20 mg/h via INTRAVENOUS
  Administered 2013-04-19 – 2013-04-25 (×13): 30 mg/h via INTRAVENOUS
  Filled 2013-04-14 (×50): qty 25

## 2013-04-14 MED ORDER — SENNOSIDES 8.6 MG PO TABS: 2.0000 | ORAL_TABLET | Freq: Every day | ORAL | Status: DC | PRN

## 2013-04-14 MED ORDER — PANTOPRAZOLE SODIUM 40 MG PO TBEC
40.0000 mg | DELAYED_RELEASE_TABLET | Freq: Every day | ORAL | Status: DC
  Administered 2013-04-15 – 2013-04-27 (×13): 40 mg via ORAL
  Filled 2013-04-14 (×13): qty 1

## 2013-04-14 MED ORDER — ACETAMINOPHEN 325 MG PO TABS
650.0000 mg | ORAL_TABLET | ORAL | Status: DC | PRN
  Administered 2013-04-23 – 2013-04-27 (×5): 650 mg via ORAL
  Filled 2013-04-14 (×5): qty 2

## 2013-04-14 MED ORDER — SIMVASTATIN 40 MG PO TABS
40.0000 mg | ORAL_TABLET | Freq: Every day | ORAL | Status: DC
  Administered 2013-04-14 – 2013-04-26 (×13): 40 mg via ORAL
  Filled 2013-04-14 (×14): qty 1

## 2013-04-14 MED ORDER — GABAPENTIN 300 MG PO CAPS
300.0000 mg | ORAL_CAPSULE | Freq: Every day | ORAL | Status: DC
  Administered 2013-04-15 – 2013-04-27 (×13): 300 mg via ORAL
  Filled 2013-04-14 (×13): qty 1

## 2013-04-14 MED ORDER — CARVEDILOL 6.25 MG PO TABS
9.3750 mg | ORAL_TABLET | Freq: Two times a day (BID) | ORAL | Status: DC
  Administered 2013-04-15 – 2013-04-27 (×24): 9.375 mg via ORAL
  Filled 2013-04-14 (×30): qty 1

## 2013-04-14 MED ORDER — SODIUM CHLORIDE 0.9 % IJ SOLN: 3.0000 mL | INTRAMUSCULAR | Status: DC | PRN

## 2013-04-14 MED ORDER — LEVOTHYROXINE SODIUM 150 MCG PO TABS: 150.0000 ug | ORAL_TABLET | Freq: Every day | ORAL | Status: DC

## 2013-04-14 MED ORDER — SODIUM CHLORIDE 0.9 % IJ SOLN
3.0000 mL | Freq: Two times a day (BID) | INTRAMUSCULAR | Status: DC
  Administered 2013-04-15 – 2013-04-21 (×4): 3 mL via INTRAVENOUS

## 2013-04-14 MED ORDER — ONDANSETRON HCL 4 MG/2ML IJ SOLN
4.0000 mg | Freq: Four times a day (QID) | INTRAMUSCULAR | Status: DC | PRN
  Administered 2013-04-19: 4 mg via INTRAVENOUS
  Filled 2013-04-14: qty 2

## 2013-04-14 MED ORDER — FUROSEMIDE 10 MG/ML IJ SOLN
80.0000 mg | Freq: Once | INTRAMUSCULAR | Status: AC
  Administered 2013-04-14: 80 mg via INTRAVENOUS
  Filled 2013-04-14: qty 8

## 2013-04-14 MED ORDER — SENNA 8.6 MG PO TABS
2.0000 | ORAL_TABLET | Freq: Every day | ORAL | Status: DC
  Administered 2013-04-15 – 2013-04-27 (×13): 17.2 mg via ORAL
  Filled 2013-04-14 (×13): qty 2

## 2013-04-14 MED ORDER — OXYCODONE HCL 5 MG PO TABS
5.0000 mg | ORAL_TABLET | Freq: Four times a day (QID) | ORAL | Status: DC | PRN
  Administered 2013-04-14: 5 mg via ORAL
  Filled 2013-04-14: qty 1

## 2013-04-14 MED ORDER — FLUOXETINE HCL 20 MG PO CAPS
20.0000 mg | ORAL_CAPSULE | Freq: Every day | ORAL | Status: DC
  Administered 2013-04-15 – 2013-04-27 (×13): 20 mg via ORAL
  Filled 2013-04-14 (×13): qty 1

## 2013-04-14 MED ORDER — LACTULOSE 10 GM/15ML PO SOLN
30.0000 g | Freq: Every day | ORAL | Status: DC
  Administered 2013-04-15 – 2013-04-27 (×13): 30 g via ORAL
  Filled 2013-04-14 (×13): qty 45

## 2013-04-14 MED ORDER — FLUCONAZOLE 200 MG PO TABS
200.0000 mg | ORAL_TABLET | Freq: Every day | ORAL | Status: AC
  Administered 2013-04-15 – 2013-04-19 (×5): 200 mg via ORAL
  Filled 2013-04-14 (×6): qty 1

## 2013-04-14 MED ORDER — ENOXAPARIN SODIUM 60 MG/0.6ML ~~LOC~~ SOLN
50.0000 mg | SUBCUTANEOUS | Status: DC
  Administered 2013-04-14 – 2013-04-17 (×4): 50 mg via SUBCUTANEOUS
  Administered 2013-04-18: 22:00:00 via SUBCUTANEOUS
  Administered 2013-04-19 – 2013-04-26 (×8): 50 mg via SUBCUTANEOUS
  Filled 2013-04-14 (×14): qty 0.6

## 2013-04-14 MED ORDER — FLUTICASONE PROPIONATE HFA 220 MCG/ACT IN AERO
1.0000 | INHALATION_SPRAY | Freq: Two times a day (BID) | RESPIRATORY_TRACT | Status: DC | PRN
  Administered 2013-04-19: 1 via RESPIRATORY_TRACT
  Filled 2013-04-14: qty 12

## 2013-04-14 MED ORDER — ASPIRIN EC 325 MG PO TBEC
325.0000 mg | DELAYED_RELEASE_TABLET | Freq: Every day | ORAL | Status: DC
  Administered 2013-04-15 – 2013-04-27 (×13): 325 mg via ORAL
  Filled 2013-04-14 (×16): qty 1

## 2013-04-14 MED ORDER — CARVEDILOL 3.125 MG PO TABS: 3.1250 mg | ORAL_TABLET | Freq: Two times a day (BID) | ORAL | Status: DC

## 2013-04-14 MED ORDER — NYSTATIN 100000 UNIT/GM EX POWD
Freq: Three times a day (TID) | CUTANEOUS | Status: DC
  Administered 2013-04-14 – 2013-04-27 (×35): via TOPICAL
  Filled 2013-04-14 (×2): qty 15

## 2013-04-14 MED ORDER — SODIUM CHLORIDE 0.9 % IV SOLN
250.0000 mL | INTRAVENOUS | Status: DC | PRN
  Administered 2013-04-17: 250 mL via INTRAVENOUS
  Administered 2013-04-23: 10 mL via INTRAVENOUS
  Administered 2013-04-25: 250 mL via INTRAVENOUS

## 2013-04-14 MED ORDER — CARVEDILOL 6.25 MG PO TABS
6.2500 mg | ORAL_TABLET | Freq: Two times a day (BID) | ORAL | Status: DC
  Filled 2013-04-14: qty 1

## 2013-04-14 MED ORDER — FOLIC ACID 1 MG PO TABS
1.0000 mg | ORAL_TABLET | Freq: Every day | ORAL | Status: DC
  Administered 2013-04-15 – 2013-04-27 (×13): 1 mg via ORAL
  Filled 2013-04-14 (×13): qty 1

## 2013-04-14 MED ORDER — OXYCODONE HCL 5 MG PO TABS
10.0000 mg | ORAL_TABLET | Freq: Four times a day (QID) | ORAL | Status: DC | PRN
  Administered 2013-04-14 – 2013-04-27 (×35): 10 mg via ORAL
  Filled 2013-04-14 (×28): qty 2
  Filled 2013-04-14: qty 1
  Filled 2013-04-14 (×9): qty 2

## 2013-04-14 MED ORDER — CEFAZOLIN SODIUM-DEXTROSE 2-3 GM-% IV SOLR
2.0000 g | Freq: Three times a day (TID) | INTRAVENOUS | Status: AC
  Administered 2013-04-14 – 2013-04-20 (×19): 2 g via INTRAVENOUS
  Filled 2013-04-14 (×20): qty 50

## 2013-04-14 NOTE — ED Notes (Signed)
Admitting at bedside 

## 2013-04-14 NOTE — ED Notes (Signed)
Patient transported to X-ray 

## 2013-04-14 NOTE — H&P (Signed)
Advanced Heart Failure Team History and Physical Note   Primary Physician: Primary Cardiologist:   Dr. Cristal Deer McAlhany/Dr. Arvilla Meres   Reason for Admission: A/C diastolic HF with massive volume overload   HPI:    [name] is a 67 yo male with PMX s/f CAD (s/p CABG x 4 in 2001, normal Myoview 2010), chronic diastolic CHF/right HF, persistent a-fib, DM, HTN, HL, carotid artery disease, COPD, hypothyroidism, EtOH abuse, chronic venous insufficiency, morbid obesity and medical non-compliance. He is not on anticoagulants due to ETOH abuse and fall risk   ECHO 08/2012 EF 55-60% moderate bilateral RA/LA enlargement. RV moderately dilated. RVSP 65-18mmHG  Admitted 08/30/12 for acute on chronic diastolic CHF. Diuresed with discharge weight of 276 pounds. He subsequently stabilized at home with weight ~245. Admitted in 3/14 with SBO treated medically. Weight at d/c 225-235.   Last seen in HF Clinic in 2023/03/12. Brother had died and he started increased drinking 3 beers a day with salt in it. Weight had gradually increased to, 244-246 lbs. Treated with metolazone for several days.   Returns to the ED today with progressive volume overload over the past few weeks.  He is here with his daughter who is his primary caregiver.  He has been weak and lethargic over the past several days.  He is difficult to arouse at times today.  He notes increased dyspnea, LE edema and abdominal girth as well as orthopnea.  No chest pain or syncope.  He has not weighed himself for more than a week.  He thinks he last weighed at 265-270 lbs.  Last weight in clinic was 249 lbs.      Review of Systems: [y] = yes, [ ]  = no   General: Weight gain Cove.Etienne ]; Weight loss [ ] ; Anorexia [ ] ; Fatigue Cove.Etienne ]; Fever [ ] ; Chills [ ] ; Weakness Cove.Etienne ]  Cardiac: Chest pain/pressure [ ] ; Resting SOB [ y]; Exertional SOB Cove.Etienne ]; Orthopnea [ y]; Pedal Edema [ y]; Palpitations [ ] ; Syncope [ ] ; Presyncope [ ] ; Paroxysmal nocturnal dyspnea[ ]    Pulmonary: Cough [ ] ; Wheezing[ ] ; Hemoptysis[ ] ; Sputum [ ] ; Snoring [ y]  GI: Vomiting[ ] ; Dysphagia[ ] ; Melena[ ] ; Hematochezia [ ] ; Heartburn[ ] ; Abdominal pain [ ] ; Constipation [ ] ; Diarrhea [ ] ; BRBPR [ ]   GU: Hematuria[ ] ; Dysuria [ ] ; Nocturia[ ]   Vascular: Pain in legs with walking [ ] ; Pain in feet with lying flat [ ] ; Non-healing sores [ ] ; Stroke [ ] ; TIA [ ] ; Slurred speech [ ] ;  Neuro: Headaches[ ] ; Vertigo[ ] ; Seizures[ ] ; Paresthesias[ ] ;Blurred vision [ ] ; Diplopia [ ] ; Vision changes [ ]   Ortho/Skin: Arthritis [ ] ; Joint pain Cove.Etienne ]; Muscle pain [ ] ; Joint swelling [ ] ; Back Pain [ ] ; Rash Cove.Etienne ]  Psych: Depression[ ] ; Anxiety[ ]   Heme: Bleeding problems [ ] ; Clotting disorders [ ] ; Anemia [ ]   Endocrine: Diabetes Cove.Etienne ]; Thyroid dysfunction[ ]   Home Medications Prior to Admission medications   Medication Sig Start Date End Date Taking? Authorizing Provider  aspirin EC 325 MG tablet Take 325 mg by mouth daily.    Historical Provider, MD  carvedilol (COREG) 3.125 MG tablet Take 1 tablet (3.125 mg total) by mouth 2 (two) times daily with a meal. Take with carvedilol 6.25mg  for a total of 9.375mg  02/13/13   Dolores Patty, MD  carvedilol (COREG) 3.125 MG tablet TAKE 1 TABLET BY MOUTH TWICE DAILY WITH MEAL. (ADD CARVEDILOL 6.25MG  TO  EQUAL DOSE OF 9.375MG ) 03/24/13   Kathleene Hazel, MD  FLUoxetine (PROZAC) 20 MG capsule Take 20 mg by mouth daily.      Historical Provider, MD  fluticasone (FLOVENT HFA) 220 MCG/ACT inhaler Inhale 1 puff into the lungs 2 (two) times daily as needed (for shortness of breath).     Historical Provider, MD  folic acid (FOLVITE) 1 MG tablet Take 1 tablet (1 mg total) by mouth daily. 09/07/12   Srikar Cherlynn Kaiser, MD  gabapentin (NEURONTIN) 300 MG capsule Take 1 capsule (300 mg total) by mouth daily. 09/07/12   Srikar Cherlynn Kaiser, MD  insulin glargine (LANTUS) 100 UNIT/ML injection Inject 10 Units into the skin at bedtime. 09/21/12   Jacquelynn Cree, PA-C   lactulose (CHRONULAC) 10 GM/15ML solution Take 45 mLs (30 g total) by mouth daily. 09/21/12   Jacquelynn Cree, PA-C  levothyroxine (SYNTHROID, LEVOTHROID) 150 MCG tablet Take 150 mcg by mouth daily.     Historical Provider, MD  Multiple Vitamin (MULTIVITAMIN) capsule Take 1 capsule by mouth daily.    Historical Provider, MD  omeprazole (PRILOSEC) 20 MG capsule Take 20 mg by mouth daily.      Historical Provider, MD  oxyCODONE (OXY IR/ROXICODONE) 5 MG immediate release tablet Take 5 mg by mouth every 6 (six) hours as needed for pain.    Historical Provider, MD  senna (SENOKOT) 8.6 MG tablet Take 2 tablets by mouth daily as needed for constipation.     Historical Provider, MD  simvastatin (ZOCOR) 40 MG tablet Take 40 mg by mouth at bedtime.      Historical Provider, MD  sodium chloride (OCEAN) 0.65 % SOLN nasal spray Place 1 spray into the nose as needed for congestion. 09/21/12   Jacquelynn Cree, PA-C  spironolactone (ALDACTONE) 25 MG tablet Take 1 tablet (25 mg total) by mouth daily. 11/22/12   Dolores Patty, MD  torsemide (DEMADEX) 20 MG tablet TAKE 5 TABLETS BY MOUTH EVERY DAY 03/03/13   Sherald Hess, NP    Past Medical History: Past Medical History  Diagnosis Date  . CHRONIC OBSTRUCTIVE PULMONARY DISEASE 06/20/2009  . OBSTRUCTIVE SLEEP APNEA 06/20/2009  . CAROTID STENOSIS 06/20/2009    A. 08/2001 s/p L CEA;  B.   09/14/11 - Carotid U/S - 40-59% bilateral stenosis, left CEA patch angioplasty is patent  . DM 06/20/2009  . CAD 06/20/2009    A.  08/2000 - s/p CABG x 4 - LIMA-LAD, Left Radial-OM, VG-DIAG, VG-RCA;  B. Neg. MV  2010  . HYPERLIPIDEMIA 06/20/2009  . HYPERTENSION 06/20/2009  . Hypothyroidism   . Low back pain   . Asthma     as child  . Pneumonia   . Atrial fibrillation     Not felt to be coumadin candidate 2/2 ETOH use.  Marland Kitchen Hypothyroidism   . ETOH abuse   . History of tobacco abuse     remote - quit 1970  . Bilateral renal cysts   . Marijuana abuse   . Morbidly obese   . CAD  (coronary artery disease)   . CHF (congestive heart failure)   . Falls frequently     Past Surgical History: Past Surgical History  Procedure Laterality Date  . Carotid endarterectomy  2002    left  . Coronary artery bypass graft      x 4 - 2001    Family History: Family History  Problem Relation Age of Onset  . Stroke Mother     ?  Marland Kitchen  Stroke Father     ?    Social History: History   Social History  . Marital Status: Widowed    Spouse Name: N/A    Number of Children: N/A  . Years of Education: N/A   Occupational History  . retired    Social History Main Topics  . Smoking status: Former Smoker    Quit date: 10/11/1968  . Smokeless tobacco: None  . Alcohol Use: Yes     Comment: wild [place] daily;  also reports regularly drinking a pint of vodka.  . Drug Use: Yes    Special: Marijuana     Comment: last 1 year ago  . Sexually Active: None   Other Topics Concern  . None   Social History Narrative   Lives in [place] with dtr, son-in-law    Allergies:  Allergies  Allergen Reactions  . Erythromycin Hives    Objective:    Vital Signs:   Temp:  [97.8 F (36.6 C)] 97.8 F (36.6 C) (07/05 1300) Pulse Rate:  [67-68] 68 (07/05 1315) Resp:  [18-19] 19 (07/05 1315) BP: (118-123)/(63) 123/63 mmHg (07/05 1315) SpO2:  [95 %-99 %] 99 % (07/05 1315)   There were no vitals filed for this visit. General: Chronically ill appearing. No resp difficulty HEENT: normal  Neck: supple. JVP to ear Carotids 2+ bilaterally; no bruits. No lymphadenopathy or thryomegaly appreciated.  Cor: PMI normal. Regular rate & rhythm. No rubs, gallops. 2/6 SEM RUSB  Lungs: decreased wt bases Abdomen: morbidly obese, soft, nontender, ++ distended. No bruits or masses. Good bowel sounds. Diffuse macular rash with desquamation around pannus  Extremities: no cyanosis, clubbing, rash, LLE and RLE 4+ edema up through thighs and into abdomen Neuro: alert & orientedx3, cranial nerves grossly  intact. Moves all 4 extremities w/o difficulty. Affect pleasant.    Labs: Basic Metabolic Panel:  Recent Labs Lab 04/14/13 1320  NA 131*  K 5.8*  CL 92*  CO2 29  GLUCOSE 121*  BUN 76*  CREATININE 1.47*  CALCIUM 9.1    Liver Function Tests:  Recent Labs Lab 04/14/13 1320  AST 42*  ALT 16  ALKPHOS 92  BILITOT 0.7  PROT 7.4  ALBUMIN 3.4*   No results found for this basename: LIPASE, AMYLASE,  in the last 168 hours No results found for this basename: AMMONIA,  in the last 168 hours  CBC:  Recent Labs Lab 04/14/13 1320  WBC 7.0  NEUTROABS 4.4  HGB 11.3*  HCT 36.1*  MCV 85.7  PLT 126*    Cardiac Enzymes:  Recent Labs Lab 04/14/13 1301  TROPONINI <0.30    BNP: BNP (last 3 results)  Recent Labs  08/30/12 0416 09/21/12 0555 04/14/13 1301  PROBNP 18070.0* 4036.0* 6847.0*    CBG: No results found for this basename: GLUCAP,  in the last 168 hours  Coagulation Studies: No results found for this basename: LABPROT, INR,  in the last 72 hours  Other results: EKG: AF 89 with R-axis and mild RVH. No ST-T wave abnormalities.   Imaging: Dg Chest 2 View  04/14/2013   *RADIOLOGY REPORT*  Clinical Data: Shortness of breath  CHEST - 2 VIEW  Comparison: 12/25/2012  Findings: Previous median sternotomy and CABG procedure. Heart size is enlarged.  The lung volumes appear lobe.  There is no pleural effusion.  Pulmonary vascular congestion noted.  No airspace consolidation.  Spondylosis noted within the thoracic spine.  IMPRESSION:  1. Cardiac enlargement and pulmonary vascular congestion.  Original Report Authenticated By: Signa Kell, M.D.        Assessment:   1. A/C diastolic heart failure with anasarca      - EF 55-60% RV moderately dilated/HK with RV-RA gradient 56 mmHg  2. Right heart failure  3. A Fib, chronic - no anticoagulation due to ETOH and falls 4. Hypothyroidism  5. Morbid obesity  6. CAD s/p CABG 7. ETOH abuse 8. DM2 9. Medical  noncompliance 10. Panniculitis 11. Acute Kidney Injury - probably cardiorenal syndrome 12. Hypernatremia/Hypokalemia  Tereso Newcomer, PA-C   04/14/2013 3:39 PM   Plan/Discussion:     Attending: Patient seen and examined with Tereso Newcomer PA-C. We discussed all aspects of the encounter. I agree with the assessment and plan as stated above.   [name] is markedly volume overloaded in setting of non-compliance and ongoing ETOH abuse. Will admit for IV diuresis with lasix gtt. Hold spiro due to hyperkalemia. Watch for DTs.   If somnolence gets worse will need ABG to look for hypercarbia.  Start vanc and fluconazole for panniculitis. Wound care consult.    Length of Stay: 0 Advanced Heart Failure Team Pager [phone] (M-F; 7a - 4p)  Please contact [facility] for night-coverage after hours (4p -7a ) and weekends on [email or web address]

## 2013-04-14 NOTE — ED Notes (Signed)
Pt resting quietly at the time. Assisted with use of bedpan. Vital signs stable. Pt remains alert and oriented x4. Admitting at bedside.

## 2013-04-14 NOTE — ED Notes (Signed)
Pt here from home with c/o sob that has been ongoing for about a week , pt is not c/o chest pain , pt  States that the swelling to the legs and feet are worse than usual

## 2013-04-14 NOTE — Progress Notes (Signed)
ANTIBIOTIC CONSULT NOTE - INITIAL  Pharmacy Consult for Cefazolin and Fluconazole Indication: Panniculitis  Allergies  Allergen Reactions  . Erythromycin Hives    Patient Measurements:  106 kg  Vital Signs: Temp: 97.8 F (36.6 C) (07/05 1300) Temp src: Oral (07/05 1300) BP: 129/71 mmHg (07/05 1603) Pulse Rate: 73 (07/05 1603) Intake/Output from previous day:   Intake/Output from this shift: Total I/O In: -  Out: 375 [Urine:375]  Labs:  Recent Labs  04/14/13 1320  WBC 7.0  HGB 11.3*  PLT 126*  CREATININE 1.47*   The CrCl is unknown because both a height and weight (above a minimum accepted value) are required for this calculation. No results found for this basename: VANCOTROUGH, VANCOPEAK, VANCORANDOM, GENTTROUGH, GENTPEAK, GENTRANDOM, TOBRATROUGH, TOBRAPEAK, TOBRARND, AMIKACINPEAK, AMIKACINTROU, AMIKACIN,  in the last 72 hours   Microbiology: No results found for this or any previous visit (from the past 720 hour(s)).  Medical History: Past Medical History  Diagnosis Date  . CHRONIC OBSTRUCTIVE PULMONARY DISEASE 06/20/2009  . OBSTRUCTIVE SLEEP APNEA 06/20/2009  . CAROTID STENOSIS 06/20/2009    A. 08/2001 s/p L CEA;  B.   09/14/11 - Carotid U/S - 40-59% bilateral stenosis, left CEA patch angioplasty is patent  . DM 06/20/2009  . CAD 06/20/2009    A.  08/2000 - s/p CABG x 4 - LIMA-LAD, Left Radial-OM, VG-DIAG, VG-RCA;  B. Neg. MV  2010  . HYPERLIPIDEMIA 06/20/2009  . HYPERTENSION 06/20/2009  . Hypothyroidism   . Low back pain   . Asthma     as child  . Pneumonia   . Atrial fibrillation     Not felt to be coumadin candidate 2/2 ETOH use.  Marland Kitchen Hypothyroidism   . ETOH abuse   . History of tobacco abuse     remote - quit 1970  . Bilateral renal cysts   . Marijuana abuse   . Morbidly obese   . CAD (coronary artery disease)   . CHF (congestive heart failure)   . Falls frequently     Medications:   (Not in a hospital admission) Assessment: 67 yo M admitted  04/14/2013  With progressive volume overload and panniculitis.  Pharmacy consulted to dose cefazolin, and fluconazole  Goal of Therapy:  Renal adjustment of antibiotics.  Plan:  1. Cefazolin 2g IV q8h, plan for 7 day course, evaluate transition to PO 2. Fluconazole 200 mg PO daily x 5 daily 3. Follow up SCr, UOP, cultures, clinical course and adjust as clinically indicated.   Thank you for allowing pharmacy to be a part of this patients care team.  Lovenia Kim Pharm.D., BCPS Clinical Pharmacist 04/14/2013 4:13 PM Pager: [phone] Phone: [phone]

## 2013-04-14 NOTE — ED Notes (Signed)
Pt assisted up to use bedpan. Vital signs stable. Pt resting quietly at the time.

## 2013-04-14 NOTE — ED Provider Notes (Signed)
History    CSN: [id number] Arrival date & time 04/14/13  1235  First MD Initiated Contact with Patient 04/14/13 1254     Chief Complaint  Patient presents with  . Shortness of Breath   HPI  Pt here from home with c/o sob that has been ongoing for about a week , pt is not c/o chest pain , pt States that the swelling to the legs and feet are worse than usual .  Patient has appointment with heart failure clinic on Monday.  Patient's had rapid worsening of edema and weight gain over last few days. Past Medical History  Diagnosis Date  . CHRONIC OBSTRUCTIVE PULMONARY DISEASE 06/20/2009  . OBSTRUCTIVE SLEEP APNEA 06/20/2009  . CAROTID STENOSIS 06/20/2009    A. 08/2001 s/p L CEA;  B.   09/14/11 - Carotid U/S - 40-59% bilateral stenosis, left CEA patch angioplasty is patent  . DM 06/20/2009  . CAD 06/20/2009    A.  08/2000 - s/p CABG x 4 - LIMA-LAD, Left Radial-OM, VG-DIAG, VG-RCA;  B. Neg. MV  2010  . HYPERLIPIDEMIA 06/20/2009  . HYPERTENSION 06/20/2009  . Hypothyroidism   . Low back pain   . Asthma     as child  . Pneumonia   . Atrial fibrillation     Not felt to be coumadin candidate 2/2 ETOH use.  Marland Kitchen Hypothyroidism   . ETOH abuse   . History of tobacco abuse     remote - quit 1970  . Bilateral renal cysts   . Marijuana abuse   . Morbidly obese   . CAD (coronary artery disease)   . CHF (congestive heart failure)   . Falls frequently    Past Surgical History  Procedure Laterality Date  . Carotid endarterectomy  2002    left  . Coronary artery bypass graft      x 4 - 2001   Family History  Problem Relation Age of Onset  . Stroke Mother     ?  . Stroke Father     ?   History  Substance Use Topics  . Smoking status: Former Smoker    Quit date: 10/11/1968  . Smokeless tobacco: Not on file  . Alcohol Use: Yes     Comment: wild [place] daily;  also reports regularly drinking a pint of vodka.    Review of Systems All other systems reviewed and are negative Allergies   Erythromycin  Home Medications   No current outpatient prescriptions on file. BP 150/39  Pulse 73  Temp(Src) 97.5 F (36.4 C) (Oral)  Resp 19  Ht 5\' 9"  (1.753 m)  Wt 279 lb 1.6 oz (126.6 kg)  BMI 41.2 kg/m2  SpO2 99% Physical Exam  Nursing note and vitals reviewed. Constitutional: He is oriented to person, place, and time. He appears well-developed and well-nourished. No distress.  HENT:  Head: Normocephalic and atraumatic.  Eyes: Pupils are equal, round, and reactive to light.  Neck: Normal range of motion.  Cardiovascular: Normal rate and intact distal pulses.   Pulmonary/Chest: No respiratory distress.  Abdominal: Normal appearance. He exhibits no distension.  Abdominal wall enlarged with fluid retention.  Musculoskeletal: Normal range of motion. He exhibits edema.  Marked edema and anasarca noted on physical exam both extremities.  Neurological: He is alert and oriented to person, place, and time. No cranial nerve deficit.  Skin: Skin is warm and dry. No rash noted.  Psychiatric: He has a normal mood and affect. His behavior is normal.  ED Course  Procedures (including critical care time) Labs Reviewed  CBC WITH DIFFERENTIAL - Abnormal; Notable for the following:    RBC 4.21 (*)    Hemoglobin 11.3 (*)    HCT 36.1 (*)    RDW 15.7 (*)    Platelets 126 (*)    Monocytes Relative 13 (*)    All other components within normal limits  COMPREHENSIVE METABOLIC PANEL - Abnormal; Notable for the following:    Sodium 131 (*)    Potassium 5.8 (*)    Chloride 92 (*)    Glucose, Bld 121 (*)    BUN 76 (*)    Creatinine, Ser 1.47 (*)    Albumin 3.4 (*)    AST 42 (*)    GFR calc non Af Amer 47 (*)    GFR calc Af Amer 54 (*)    All other components within normal limits  PRO B NATRIURETIC PEPTIDE - Abnormal; Notable for the following:    Pro B Natriuretic peptide (BNP) 6847.0 (*)    All other components within normal limits  PROTIME-INR - Abnormal; Notable for the  following:    Prothrombin Time 15.7 (*)    All other components within normal limits  CBC - Abnormal; Notable for the following:    RBC 4.21 (*)    Hemoglobin 11.2 (*)    HCT 36.0 (*)    RDW 15.7 (*)    Platelets 124 (*)    All other components within normal limits  CREATININE, SERUM - Abnormal; Notable for the following:    GFR calc non Af Amer 52 (*)    GFR calc Af Amer 60 (*)    All other components within normal limits  MRSA PCR SCREENING  TROPONIN I  TROPONIN I  APTT  TROPONIN I  BASIC METABOLIC PANEL   Date: 04/14/2013  Rate: 72  Rhythm: Atrial fibrillation  QRS Axis: Right axis deviation  Intervals: normal  ST/T Wave abnormalities: Nonspecific T wave changes  Conduction Disutrbances: none  Narrative Interpretation: Abnormal EKG     Dg Chest 2 View  04/14/2013   *RADIOLOGY REPORT*  Clinical Data: Shortness of breath  CHEST - 2 VIEW  Comparison: 12/25/2012  Findings: Previous median sternotomy and CABG procedure. Heart size is enlarged.  The lung volumes appear lobe.  There is no pleural effusion.  Pulmonary vascular congestion noted.  No airspace consolidation.  Spondylosis noted within the thoracic spine.  IMPRESSION:  1. Cardiac enlargement and pulmonary vascular congestion.   Original Report Authenticated By: Signa Kell, M.D.   1. Panniculitis   2. Acute on chronic diastolic CHF (congestive heart failure)   3. Acute renal failure   4. Atrial fibrillation   5. ETOH abuse     MDM  Case discussed with Dr. Garth Schlatter who will come and evaluate the patient.  Nelia Shi, MD 04/14/13 2100

## 2013-04-15 DIAGNOSIS — R609 Edema, unspecified: Secondary | ICD-10-CM

## 2013-04-15 LAB — BASIC METABOLIC PANEL
Calcium: 9.3 mg/dL (ref 8.4–10.5)
GFR calc Af Amer: 64 mL/min — ABNORMAL LOW (ref >90)
GFR calc non Af Amer: 55 mL/min — ABNORMAL LOW (ref >90)
Glucose, Bld: 170 mg/dL — ABNORMAL HIGH (ref 70–99)
Potassium: 4.2 mEq/L (ref 3.5–5.1)
Sodium: 135 mEq/L (ref 135–145)

## 2013-04-15 LAB — GLUCOSE, CAPILLARY: Glucose-Capillary: 156 mg/dL — ABNORMAL HIGH (ref 70–99)

## 2013-04-15 MED ORDER — METOLAZONE 5 MG PO TABS
5.0000 mg | ORAL_TABLET | Freq: Two times a day (BID) | ORAL | Status: DC
  Administered 2013-04-15 – 2013-04-24 (×20): 5 mg via ORAL
  Filled 2013-04-15 (×24): qty 1

## 2013-04-15 NOTE — Progress Notes (Addendum)
Advanced Heart Failure Rounding Note   Subjective:    Diuresed well on initial IV lasix bolus but now trailing off. Foley in place due to immobility and wounds. Weight down 6 pounds.   Large amount of scrotal bleeding last night.   Breathing better.     Objective:   Weight Range:  Vital Signs:   Temp:  [97.5 F (36.4 C)-98.4 F (36.9 C)] 97.9 F (36.6 C) (07/06 0755) Pulse Rate:  [67-73] 73 (07/05 1603) Resp:  [16-19] 16 (07/06 0804) BP: (103-153)/(39-84) 103/77 mmHg (07/06 0755) SpO2:  [91 %-99 %] 99 % (07/06 0804) Weight:  [124 kg (273 lb 5.9 oz)-126.6 kg (279 lb 1.6 oz)] 124 kg (273 lb 5.9 oz) (07/06 0431) Last BM Date: 04/13/13  Weight change: Filed Weights   04/14/13 1938 04/15/13 0311 04/15/13 0431  Weight: 126.6 kg (279 lb 1.6 oz) 124 kg (273 lb 5.9 oz) 124 kg (273 lb 5.9 oz)    Intake/Output:   Intake/Output Summary (Last 24 hours) at 04/15/13 1036 Last data filed at 04/15/13 0900  Gross per 24 hour  Intake    548 ml  Output   4176 ml  Net  -3628 ml     Physical Exam: General: Chronically ill appearing. No resp difficulty  HEENT: normal  Neck: supple. JVP to ear Carotids 2+ bilaterally; no bruits. No lymphadenopathy or thryomegaly appreciated.  Cor: PMI normal. Irregular rate & rhythm. No rubs, gallops. 2/6 SEM RUSB  Lungs: decreased wt bases  Abdomen: morbidly obese, soft, nontender, ++ distended. No bruits or masses. Good bowel sounds. Diffuse macular rash with desquamation around pannus  Extremities: no cyanosis, clubbing, rash, LLE and RLE 4+ edema up through thighs and into abdomen  Neuro: alert & orientedx3, cranial nerves grossly intact. Moves all 4 extremities w/o difficulty. Affect pleasant.    Telemetry: AF 60s  Labs: Basic Metabolic Panel:  Recent Labs Lab 04/14/13 1320 04/14/13 1854 04/15/13 0105  NA 131*  --  135  K 5.8*  --  4.2  CL 92*  --  95*  CO2 29  --  33*  GLUCOSE 121*  --  170*  BUN 76*  --  77*  CREATININE 1.47*  1.35 1.29  CALCIUM 9.1  --  9.3    Liver Function Tests:  Recent Labs Lab 04/14/13 1320  AST 42*  ALT 16  ALKPHOS 92  BILITOT 0.7  PROT 7.4  ALBUMIN 3.4*   No results found for this basename: LIPASE, AMYLASE,  in the last 168 hours No results found for this basename: AMMONIA,  in the last 168 hours  CBC:  Recent Labs Lab 04/14/13 1320 04/14/13 1854  WBC 7.0 7.2  NEUTROABS 4.4  --   HGB 11.3* 11.2*  HCT 36.1* 36.0*  MCV 85.7 85.5  PLT 126* 124*    Cardiac Enzymes:  Recent Labs Lab 04/14/13 1301 04/14/13 1854 04/15/13 0105  TROPONINI <0.30 <0.30 <0.30    BNP: BNP (last 3 results)  Recent Labs  08/30/12 0416 09/21/12 0555 04/14/13 1301  PROBNP 18070.0* 4036.0* 6847.0*     Other results:  Imaging: Dg Chest 2 View  04/14/2013   *RADIOLOGY REPORT*  Clinical Data: Shortness of breath  CHEST - 2 VIEW  Comparison: 12/25/2012  Findings: Previous median sternotomy and CABG procedure. Heart size is enlarged.  The lung volumes appear lobe.  There is no pleural effusion.  Pulmonary vascular congestion noted.  No airspace consolidation.  Spondylosis noted within the thoracic spine.  IMPRESSION:  1. Cardiac enlargement and pulmonary vascular congestion.   Original Report Authenticated By: Signa Kell, M.D.      Medications:     Scheduled Medications: . aspirin EC  325 mg Oral Daily  . carvedilol  9.375 mg Oral BID WC  .  ceFAZolin (ANCEF) IV  2 g Intravenous Q8H  . enoxaparin (LOVENOX) injection  50 mg Subcutaneous Q24H  . fluconazole  200 mg Oral Daily  . FLUoxetine  20 mg Oral Daily  . folic acid  1 mg Oral Daily  . gabapentin  300 mg Oral Daily  . insulin glargine  10 Units Subcutaneous QHS  . lactulose  30 g Oral Daily  . levothyroxine  150 mcg Oral QAC breakfast  . multivitamin with minerals  1 tablet Oral Daily  . nystatin   Topical TID  . pantoprazole  40 mg Oral Daily  . senna  2 tablet Oral Daily  . simvastatin  40 mg Oral QHS  . sodium  chloride  3 mL Intravenous Q12H     Infusions: . furosemide (LASIX) infusion 15 mg/hr (04/15/13 0600)     PRN Medications:  sodium chloride, acetaminophen, fluticasone, ondansetron (ZOFRAN) IV, oxyCODONE, sodium chloride   Assessment:   1. A/C diastolic heart failure with anasarca  - EF 55-60% RV moderately dilated/HK with RV-RA gradient 56 mmHg  2. Right heart failure  3. A Fib, chronic - no anticoagulation due to ETOH and falls  4. Hypothyroidism  5. Morbid obesity  6. CAD s/p CABG  7. ETOH abuse  8. DM2  9. Medical noncompliance  10. Panniculitis  11. Acute Kidney Injury - probably cardiorenal syndrome  12. Hypernatremia/Hypokalemia   Plan/Discussion:     Diuresing well. Weight down 6 pounds. Renal function improved. Still with 30+ pounds of fluid to go. Given that u/o is slowing down will increase lasix gtt to 20 and add metolazone. Continue Ancef and fluconazole for panniculitis. Wound consult pending.  Be careful with narcotics as i suspect he is CO2 retainer. No evidence ETOH withdrawal at this point.   I suspect he will would benefit from SNF on d/c.  Length of Stay: 1 Arvilla Meres 04/15/2013, 10:37 AM  Advanced Heart Failure Team Pager [phone] (M-F; 7a - 4p)  Please contact [facility] for night-coverage after hours (4p -7a ) and weekends on [email or web address]

## 2013-04-15 NOTE — Consult Note (Signed)
WOC consult Note Reason for Consult: Moisture associated skin damage (MASD), specifically ITD (intertriginous dermatitis) at pannus and bilateral groin areas Wound type:MASD and associated bacteria; poor hygiene Pressure Ulcer POA: No Measurement:  0.2cm x 30cm x .2cm at pannus; groin areas are 0.2cm x 20cm x .2cm bilaterally. Wound RUE:AVWU, moist, friable tissue Drainage (amount, consistency, odor) small amount serous Periwound:erythematous with satellite lesions in groin Dressing procedure/placement/frequency: I will implement treatment for the intertriginous dermatitis with an antimicrobial textile product, [employer] [place] # [phone]). Instructions for nursing staff are included in the orders; noted that an antifungal (difulcan) has been started as a system intervention. I expect that with the hygiene provided by the staff in conjunction with the antimicrobial textile ([employer] while in house, these areas will improve considerable.  Of course, concern remains for patient's ability to care for himself at home after discharge. WOC Nursing team will not follow, but will remain available to this patient, his nursing and medical teams.  Please re-consult if needed. Thanks, Ladona Mow, MSN, RN, [facility], CWOCN [phone])

## 2013-04-15 NOTE — Care Management Note (Signed)
Cm spoke with patient concerning discharge planning. With pt's permission Cm contacted adult daughter [name] on the phone concerning discharge planning at [phone]. Per daughter pt is currently active with [facility] services. Pt would like to resume HHPT and add services of [facility] upon discharge. Pt has home C-pap machine provided by [facility]. No other dme requested. [facility] notified of pt's admission. Awaiting MD orders for RN/PT/Aide   Leonie Green [phone]

## 2013-04-16 ENCOUNTER — Ambulatory Visit (HOSPITAL_COMMUNITY): Payer: Medicare Other

## 2013-04-16 LAB — CBC
Hemoglobin: 9.8 g/dL — ABNORMAL LOW (ref 13.0–17.0)
Hemoglobin: 10.8 g/dL — ABNORMAL LOW (ref 13.0–17.0)
MCH: 25.7 pg — ABNORMAL LOW (ref 26.0–34.0)
MCH: 26.4 pg (ref 26.0–34.0)
MCHC: 29.4 g/dL — ABNORMAL LOW (ref 30.0–36.0)
MCHC: 30.3 g/dL (ref 30.0–36.0)
MCV: 87.4 fL (ref 78.0–100.0)
MCV: 87 fL (ref 78.0–100.0)
Platelets: 129 10*3/uL — ABNORMAL LOW (ref 150–400)
RBC: 3.81 MIL/uL — ABNORMAL LOW (ref 4.22–5.81)
RBC: 4.09 MIL/uL — ABNORMAL LOW (ref 4.22–5.81)

## 2013-04-16 LAB — BASIC METABOLIC PANEL
BUN: 73 mg/dL — ABNORMAL HIGH (ref 6–23)
CO2: 34 mEq/L — ABNORMAL HIGH (ref 19–32)
Calcium: 8.8 mg/dL (ref 8.4–10.5)
GFR calc non Af Amer: 48 mL/min — ABNORMAL LOW (ref >90)
Glucose, Bld: 127 mg/dL — ABNORMAL HIGH (ref 70–99)

## 2013-04-16 LAB — GLUCOSE, CAPILLARY
Glucose-Capillary: 185 mg/dL — ABNORMAL HIGH (ref 70–99)
Glucose-Capillary: 197 mg/dL — ABNORMAL HIGH (ref 70–99)

## 2013-04-16 MED ORDER — HYDRALAZINE HCL 25 MG PO TABS
25.0000 mg | ORAL_TABLET | Freq: Three times a day (TID) | ORAL | Status: DC
  Administered 2013-04-16 – 2013-04-27 (×35): 25 mg via ORAL
  Filled 2013-04-16 (×37): qty 1

## 2013-04-16 NOTE — Progress Notes (Signed)
CARDIAC REHAB PHASE I   PRE:  Rate/Rhythm: 66 afib  BP:  Supine:   Sitting:   Standing: 123/63   SaO2: 95%2.5L  MODE:  Ambulation: 150 ft   POST:  Rate/Rhythm: 74afib  BP:  Supine:   Sitting:   Standing: 122/49   SaO2: 94%3L 1410-1510 Pt walked 180 ft on 3L with rolling walker and asst x 3.  One asst to follow with recliner. Pt had to sit to rest once. Pt c/o feeling scalded under folds of abdomen and c/o burning when he voids. Encouraged pt to stay close to rolling walker. Pt would benefit from PT consult to help with discharge plans. To bed in lying position after walk. Put back on 2.5 L of oxygen.[name], RN BSN  04/16/2013 3:04 PM

## 2013-04-16 NOTE — Plan of Care (Signed)
Problem: Food- and Nutrition-Related Knowledge Deficit (NB-1.1) Goal: Nutrition education Formal process to instruct or train a patient/client in a skill or to impart knowledge to help patients/clients voluntarily manage or modify food choices and eating behavior to maintain or improve health. Outcome: Completed/Met Date Met:  04/16/13 Nutrition Education Note  RD consulted for nutrition education regarding CHF.  Pt reports he has been educated in the past and is able to recall general principles of a Heart Healthy diet, however states "I've forgotten most of it."  Pt requests handouts.  RD provided "Low Sodium Nutrition Therapy" handout from the Academy of [employer]. Discussed home diet. Provided examples on ways to decrease sodium intake in diet. Discouraged intake of processed foods and use of salt shaker. Encouraged fresh fruits and vegetables as well as whole grain sources of carbohydrates to maximize fiber intake.   RD discussed why it is important for patient to adhere to diet recommendations, and emphasized the role of fluids, foods to avoid, and importance of weighing self daily. Teach back method used.  Expect good compliance.  Body mass index is 40.12 kg/(m^2). Pt meets criteria for morbid obesity based on current BMI.  Current diet order is Heart Healthy, patient is consuming approximately 50% of meals at this time. Labs and medications reviewed. No further nutrition interventions warranted at this time. RD contact information provided. If additional nutrition issues arise, please re-consult RD.   Loyce Dys, MS RD LDN Clinical Inpatient Dietitian Pager: [phone] Weekend/After hours pager: [phone]

## 2013-04-16 NOTE — Progress Notes (Signed)
Advanced Heart Failure Rounding Note   Subjective:   [name] is a 67 yo male with PMX s/f CAD (s/p CABG x 4 in 2001, normal Myoview 2010), chronic diastolic CHF/right HF, persistent a-fib, DM, HTN, HL, carotid artery disease, COPD, hypothyroidism, EtOH abuse, chronic venous insufficiency, morbid obesity and medical non-compliance. He is not on anticoagulants due to ETOH abuse and fall risk   EF 55-60% moderate RV dysfunction  Weight down 2 lbs, 24hr I/O -1.9L on 20 mg lasix gtt.  Cr increase from 1.29 to 1.45. SBP 115-130s. Wound consult yesterday and initiated diflucan.  Denies SOB or CP.   Objective:   Weight Range:  Vital Signs:   Temp:  [97.4 F (36.3 C)-97.9 F (36.6 C)] 97.6 F (36.4 C) (07/07 1203) Pulse Rate:  [62-73] 63 (07/07 1203) BP: (81-135)/(43-66) 123/54 mmHg (07/07 1203) SpO2:  [92 %-100 %] 98 % (07/07 1203) Weight:  [123.3 kg (271 lb 13.2 oz)] 123.3 kg (271 lb 13.2 oz) (07/07 0800) Last BM Date: 04/15/13  Weight change: Filed Weights   04/15/13 0431 04/16/13 0147 04/16/13 0800  Weight: 124 kg (273 lb 5.9 oz) 123.3 kg (271 lb 13.2 oz) 123.3 kg (271 lb 13.2 oz)    Intake/Output:   Intake/Output Summary (Last 24 hours) at 04/16/13 1319 Last data filed at 04/16/13 1200  Gross per 24 hour  Intake 1323.92 ml  Output   3875 ml  Net -2551.08 ml     Physical Exam: General: Chronically ill appearing. No resp difficulty; sitting on side of bed HEENT: normal  Neck: supple. JVP 10-11 Carotids 2+ bilaterally; no bruits. No lymphadenopathy or thryomegaly appreciated.  Cor: PMI normal. Irregular rate & rhythm. No rubs, gallops. 2/6 SEM RUSB  Lungs: decreased at bases  Abdomen: morbidly obese, soft, nontender, + distended. No bruits or masses. Good bowel sounds. Diffuse macular rash with desquamation around pannus  Extremities: no cyanosis, clubbing, rash, LLE and RLE 3+ edema up through thighs and into abdomen; legs wrapped Neuro: alert & orientedx3, cranial  nerves grossly intact. Moves all 4 extremities w/o difficulty. Affect pleasant.    Telemetry: AF 60s  Labs: Basic Metabolic Panel:  Recent Labs Lab 04/14/13 1320 04/14/13 1854 04/15/13 0105 04/16/13 0345  NA 131*  --  135 135  K 5.8*  --  4.2 4.0  CL 92*  --  95* 94*  CO2 29  --  33* 34*  GLUCOSE 121*  --  170* 127*  BUN 76*  --  77* 73*  CREATININE 1.47* 1.35 1.29 1.45*  CALCIUM 9.1  --  9.3 8.8    Liver Function Tests:  Recent Labs Lab 04/14/13 1320  AST 42*  ALT 16  ALKPHOS 92  BILITOT 0.7  PROT 7.4  ALBUMIN 3.4*   No results found for this basename: LIPASE, AMYLASE,  in the last 168 hours No results found for this basename: AMMONIA,  in the last 168 hours  CBC:  Recent Labs Lab 04/14/13 1320 04/14/13 1854 04/16/13 0345 04/16/13 0930  WBC 7.0 7.2 6.8 6.4  NEUTROABS 4.4  --   --   --   HGB 11.3* 11.2* 9.8* 10.8*  HCT 36.1* 36.0* 33.3* 35.6*  MCV 85.7 85.5 87.4 87.0  PLT 126* 124* 129* 134*    Cardiac Enzymes:  Recent Labs Lab 04/14/13 1301 04/14/13 1854 04/15/13 0105  TROPONINI <0.30 <0.30 <0.30    BNP: BNP (last 3 results)  Recent Labs  08/30/12 0416 09/21/12 0555 04/14/13 1301  PROBNP 18070.0*  4036.0* 6847.0*     Other results:  Imaging: Dg Chest 2 View  04/14/2013   *RADIOLOGY REPORT*  Clinical Data: Shortness of breath  CHEST - 2 VIEW  Comparison: 12/25/2012  Findings: Previous median sternotomy and CABG procedure. Heart size is enlarged.  The lung volumes appear lobe.  There is no pleural effusion.  Pulmonary vascular congestion noted.  No airspace consolidation.  Spondylosis noted within the thoracic spine.  IMPRESSION:  1. Cardiac enlargement and pulmonary vascular congestion.   Original Report Authenticated By: Signa Kell, M.D.     Medications:     Scheduled Medications: . aspirin EC  325 mg Oral Daily  . carvedilol  9.375 mg Oral BID WC  .  ceFAZolin (ANCEF) IV  2 g Intravenous Q8H  . enoxaparin (LOVENOX)  injection  50 mg Subcutaneous Q24H  . fluconazole  200 mg Oral Daily  . FLUoxetine  20 mg Oral Daily  . folic acid  1 mg Oral Daily  . gabapentin  300 mg Oral Daily  . hydrALAZINE  25 mg Oral TID  . insulin glargine  10 Units Subcutaneous QHS  . lactulose  30 g Oral Daily  . levothyroxine  150 mcg Oral QAC breakfast  . metolazone  5 mg Oral BID  . multivitamin with minerals  1 tablet Oral Daily  . nystatin   Topical TID  . pantoprazole  40 mg Oral Daily  . senna  2 tablet Oral Daily  . simvastatin  40 mg Oral QHS  . sodium chloride  3 mL Intravenous Q12H    Infusions: . furosemide (LASIX) infusion 20 mg/hr (04/16/13 0600)    PRN Medications: sodium chloride, acetaminophen, fluticasone, ondansetron (ZOFRAN) IV, oxyCODONE, sodium chloride   Assessment:   1. A/C diastolic heart failure with anasarca  - EF 55-60% RV moderately dilated/HK with RV-RA gradient 56 mmHg  2. Right heart failure  3. A Fib, chronic - no anticoagulation due to ETOH and falls  4. Hypothyroidism  5. Morbid obesity  6. CAD s/p CABG  7. ETOH abuse  8. DM2  9. Medical noncompliance  10. Panniculitis  11. Acute Kidney Injury - probably cardiorenal syndrome  12. Hypernatremia/Hypokalemia   Plan/Discussion:    Continues to diurese. Weight down another 2 lbs, however rate of diuresis is slower than i would expect given degree of volume overloaded. Slight bump in Cr to 1.45 - but not far from baseline. Will continue IV lasix and metolazone. Follow renal function closely. If diuresis not picking up or renal function getting worse would start milrinone.   SBP 115-130s and will start hydralazine for afterload reduction.   Will consult CR and dietician.   Patient is followed by [facility] and wants to go home with home health. Will need to discuss with daughter whether this is the best option for patient, I believe the best option is SNF.   Length of Stay: 2 Arvilla Meres 04/16/2013, 1:19 PM  Advanced  Heart Failure Team Pager [phone] (M-F; 7a - 4p)  Please contact [facility] for night-coverage after hours (4p -7a ) and weekends on [email or web address]

## 2013-04-17 LAB — BASIC METABOLIC PANEL
BUN: 75 mg/dL — ABNORMAL HIGH (ref 6–23)
CO2: 35 mEq/L — ABNORMAL HIGH (ref 19–32)
Calcium: 9.1 mg/dL (ref 8.4–10.5)
Chloride: 90 mEq/L — ABNORMAL LOW (ref 96–112)
Creatinine, Ser: 1.34 mg/dL (ref 0.50–1.35)

## 2013-04-17 LAB — GLUCOSE, CAPILLARY
Glucose-Capillary: 139 mg/dL — ABNORMAL HIGH (ref 70–99)
Glucose-Capillary: 146 mg/dL — ABNORMAL HIGH (ref 70–99)

## 2013-04-17 MED ORDER — MILRINONE IN DEXTROSE 20 MG/100ML IV SOLN
0.2500 ug/kg/min | INTRAVENOUS | Status: DC
  Administered 2013-04-17 – 2013-04-18 (×4): 0.25 ug/kg/min via INTRAVENOUS
  Filled 2013-04-17 (×4): qty 100

## 2013-04-17 NOTE — Progress Notes (Signed)
Advanced Heart Failure Rounding Note   Subjective:   Mr. Arizmendi is a 67 yo male with PMX s/f CAD (s/p CABG x 4 in 2001, normal Myoview 2010), chronic diastolic CHF/right HF, persistent a-fib, DM, HTN, HL, carotid artery disease, COPD, hypothyroidism, EtOH abuse, chronic venous insufficiency, morbid obesity and medical non-compliance. He is not on anticoagulants due to ETOH abuse and fall risk   EF 55-60% moderate RV dysfunction  Weight down 3 lbs (total 11 lbs) 24hr I/O -3.0L on 20 mg lasix gtt and metolazone 5 mg BID. Lowest dry weight on record was in 3/14 and was 232 lbs. Cr improving 1.34. SBP 90-115s, hydralazine started yesterday. Denies SOB or CP.   Objective:   Weight Range:  Vital Signs:   Temp:  [97.4 F (36.3 C)-98.4 F (36.9 C)] 98.4 F (36.9 C) (07/08 1249) Pulse Rate:  [65] 65 (07/07 1600) Resp:  [16-19] 19 (07/07 2011) BP: (97-161)/(34-67) 113/46 mmHg (07/08 1249) SpO2:  [56 %-100 %] 100 % (07/08 1249) Weight:  [121.9 kg (268 lb 11.9 oz)] 121.9 kg (268 lb 11.9 oz) (07/08 0500) Last BM Date: 04/16/13  Weight change: Filed Weights   04/16/13 0147 04/16/13 0800 04/17/13 0500  Weight: 123.3 kg (271 lb 13.2 oz) 123.3 kg (271 lb 13.2 oz) 121.9 kg (268 lb 11.9 oz)    Intake/Output:   Intake/Output Summary (Last 24 hours) at 04/17/13 1440 Last data filed at 04/17/13 1200  Gross per 24 hour  Intake   1610 ml  Output   4750 ml  Net  -3140 ml     Physical Exam: General: Chronically ill appearing. No resp difficulty; eating breakfast HEENT: normal  Neck: supple. JVP 10-11 Carotids 2+ bilaterally; no bruits. No lymphadenopathy or thryomegaly appreciated.  Cor: PMI normal. Irregular rate & rhythm. No rubs, gallops. 2/6 SEM RUSB  Lungs: decreased at bases  Abdomen: morbidly obese, soft, nontender, + distended. No bruits or masses. Good bowel sounds. Diffuse macular rash with desquamation around pannus  Extremities: no cyanosis, clubbing, rash, LLE and RLE 3+ edema up  through thighs and into abdomen; legs wrapped Neuro: alert & orientedx3, cranial nerves grossly intact. Moves all 4 extremities w/o difficulty. Affect pleasant.    Telemetry: AF 60s  Labs: Basic Metabolic Panel:  Recent Labs Lab 04/14/13 1320 04/14/13 1854 04/15/13 0105 04/16/13 0345 04/17/13 0458  NA 131*  --  135 135 133*  K 5.8*  --  4.2 4.0 5.2*  CL 92*  --  95* 94* 90*  CO2 29  --  33* 34* 35*  GLUCOSE 121*  --  170* 127* 127*  BUN 76*  --  77* 73* 75*  CREATININE 1.47* 1.35 1.29 1.45* 1.34  CALCIUM 9.1  --  9.3 8.8 9.1    Liver Function Tests:  Recent Labs Lab 04/14/13 1320  AST 42*  ALT 16  ALKPHOS 92  BILITOT 0.7  PROT 7.4  ALBUMIN 3.4*   No results found for this basename: LIPASE, AMYLASE,  in the last 168 hours No results found for this basename: AMMONIA,  in the last 168 hours  CBC:  Recent Labs Lab 04/14/13 1320 04/14/13 1854 04/16/13 0345 04/16/13 0930  WBC 7.0 7.2 6.8 6.4  NEUTROABS 4.4  --   --   --   HGB 11.3* 11.2* 9.8* 10.8*  HCT 36.1* 36.0* 33.3* 35.6*  MCV 85.7 85.5 87.4 87.0  PLT 126* 124* 129* 134*    Cardiac Enzymes:  Recent Labs Lab 04/14/13 1301 04/14/13 1854  04/15/13 0105  TROPONINI <0.30 <0.30 <0.30    BNP: BNP (last 3 results)  Recent Labs  08/30/12 0416 09/21/12 0555 04/14/13 1301  PROBNP 18070.0* 4036.0* 6847.0*     Other results:  Imaging: No results found.   Medications:     Scheduled Medications: . aspirin EC  325 mg Oral Daily  . carvedilol  9.375 mg Oral BID WC  .  ceFAZolin (ANCEF) IV  2 g Intravenous Q8H  . enoxaparin (LOVENOX) injection  50 mg Subcutaneous Q24H  . fluconazole  200 mg Oral Daily  . FLUoxetine  20 mg Oral Daily  . folic acid  1 mg Oral Daily  . gabapentin  300 mg Oral Daily  . hydrALAZINE  25 mg Oral TID  . insulin glargine  10 Units Subcutaneous QHS  . lactulose  30 g Oral Daily  . levothyroxine  150 mcg Oral QAC breakfast  . metolazone  5 mg Oral BID  .  multivitamin with minerals  1 tablet Oral Daily  . nystatin   Topical TID  . pantoprazole  40 mg Oral Daily  . senna  2 tablet Oral Daily  . simvastatin  40 mg Oral QHS  . sodium chloride  3 mL Intravenous Q12H    Infusions: . furosemide (LASIX) infusion 20 mg/hr (04/17/13 0454)  . milrinone      PRN Medications: sodium chloride, acetaminophen, fluticasone, ondansetron (ZOFRAN) IV, oxyCODONE, sodium chloride   Assessment:   1. A/C diastolic heart failure with anasarca  - EF 55-60% RV moderately dilated/HK with RV-RA gradient 56 mmHg  2. Right heart failure  3. A Fib, chronic - no anticoagulation due to ETOH and falls  4. Hypothyroidism  5. Morbid obesity  6. CAD s/p CABG  7. ETOH abuse  8. DM2  9. Medical noncompliance  10. Panniculitis  11. Acute Kidney Injury - probably cardiorenal syndrome  12. Hypernatremia/Hypokalemia   Plan/Discussion:    Weight down another 3 lbs, however remains still about 35 lbs above baseline. Given RV failure and the fact that diuresis is not as brisk as expected will add trial of milrinone to see if it will facilitate diuresis. Follow Cr and SBP closely. Could consider ultrafiltration as well. Will await response to milrinone.   Continue wound care.  Will consult PT  Patient is followed by [facility] and wants to go home with home health. Will need to discuss with daughter whether this is the best option for patient, I believe the best option is SNF.   Length of Stay: 3 Arvilla Meres MD 04/17/2013, 2:40 PM  Advanced Heart Failure Team Pager [phone] (M-F; 7a - 4p)  Please contact [facility] for night-coverage after hours (4p -7a ) and weekends on [email or web address]

## 2013-04-17 NOTE — Evaluation (Addendum)
Physical Therapy Evaluation Patient Details Name: [name] MRN: [id number] DOB: [birth year] Today's Date: 04/17/2013 Time: 4098-1191 PT Time Calculation (min): 28 min  PT Assessment / Plan / Recommendation History of Present Illness  Pt admitted with fluid overload and AC diastolic HF.  Clinical Impression  Pt with decreased activity tolerance/endurance and most difficulty with sit to stand however is ambulating well at min guard level despite bilat LE edema. Pt requires 24/7 assist for safe d/c home. If 24/7 assist is not available pt to require ST-SNF to achieve safe mod I function for safe d/c home.    PT Assessment  Patient needs continued PT services    Follow Up Recommendations  Home health PT;Supervision/Assistance - 24 hour    Does the patient have the potential to tolerate intense rehabilitation      Barriers to Discharge        Equipment Recommendations  Rolling walker with 5" wheels    Recommendations for Other Services     Frequency Min 3X/week    Precautions / Restrictions Precautions Precautions: Fall Restrictions Weight Bearing Restrictions: No   Pertinent Vitals/Pain Pt preports soreness in groin area.      Mobility  Bed Mobility Bed Mobility: Not assessed Transfers Transfers: Sit to Stand;Stand to Sit Sit to Stand: 2: Max assist;With upper extremity assist;With armrests;From chair/3-in-1 Stand to Sit: 4: Min assist;With upper extremity assist;To chair/3-in-1 Details for Transfer Assistance: requires use of rocking/momentum, max verbal cues for safe hand placement Ambulation/Gait Ambulation/Gait Assistance: 4: Min guard Ambulation Distance (Feet): 150 Feet Assistive device: Rolling walker Ambulation/Gait Assistance Details: v/c's to stay in walker, SPO2 > 92% on 2 Lo2 via [place], no rest breaks, no episodes of LOB Gait Pattern: Step-to pattern;Trunk flexed;Wide base of support Gait velocity: slow Stairs: No    Exercises     PT Diagnosis:  Difficulty walking;Generalized weakness  PT Problem List: Decreased strength;Decreased activity tolerance;Decreased balance;Decreased mobility;Cardiopulmonary status limiting activity PT Treatment Interventions: Gait training;Stair training;Functional mobility training;Therapeutic activities;Therapeutic exercise     PT Goals(Current goals can be found in the care plan section) Acute Rehab PT Goals Patient Stated Goal: home PT Goal Formulation: With patient Time For Goal Achievement: 05/01/13 Potential to Achieve Goals: Good  Visit Information  Last PT Received On: 04/17/13 Assistance Needed: +1 History of Present Illness: Pt admitted with fluid overload and AC diastolic HF.       Prior Functioning  Home Living Family/patient expects to be discharged to:: Private residence Living Arrangements: Children Available Help at Discharge: Family;Available PRN/intermittently Type of Home: House Home Access: Stairs to enter [employer] of Steps: 3 Entrance Stairs-Rails: Can reach both Home Layout: One level Home Equipment: Walker - 2 wheels;Cane - single point;Shower seat;Bedside commode Prior Function Level of Independence: Needs assistance Gait / Transfers Assistance Needed: pt amb with RW ADL's / Homemaking Assistance Needed: daughter assist with dressing bathing and cooking Communication Communication: No difficulties Dominant Hand: Right    Cognition  Cognition Arousal/Alertness: Awake/alert Behavior During Therapy: WFL for tasks assessed/performed Overall Cognitive Status: Within Functional Limits for tasks assessed    Extremity/Trunk Assessment Upper Extremity Assessment Upper Extremity Assessment: Generalized weakness (bilat finger neuropathy) Lower Extremity Assessment Lower Extremity Assessment: Generalized weakness (bilat LE cellulitis L >R, L edeam > R) Cervical / Trunk Assessment Cervical / Trunk Assessment: Kyphotic   Balance    End of Session PT - End  of Session Equipment Utilized During Treatment: Gait belt;Oxygen Activity Tolerance: Patient limited by fatigue Patient left: in chair;with  call bell/phone within reach;with family/visitor present Nurse Communication: Mobility status  GP     Marcene Brawn 04/17/2013, 2:29 PM  Lewis Shock, PT, DPT Pager #: [phone] Office #: [phone]

## 2013-04-17 NOTE — Progress Notes (Signed)
CARDIAC REHAB PHASE I   PRE:  Rate/Rhythm: 66 afib    BP: sitting 113/64    SaO2: 96 RA  MODE:  Ambulation: 250 ft   POST:  Rate/Rhythm: 90 afib    BP: sitting 132/48     SaO2: 100 2L  Pt stronger today. Used RW, 2L, assist x1 with another assist to follow with recliner. Did not have to sit. C/o back and leg fatigue with distance. Encouraged pt to elevate legs so therefore to recliner after walk with legs elevated. Pt comfortable but sts he might need to put them down after a while due to sciatic pain. Will continue to follow. [id number]   Harriet Masson CES, ACSM 04/17/2013 9:28 AM

## 2013-04-18 ENCOUNTER — Inpatient Hospital Stay (HOSPITAL_COMMUNITY): Payer: Medicare Other

## 2013-04-18 LAB — CARBOXYHEMOGLOBIN
Carboxyhemoglobin: 2.2 % — ABNORMAL HIGH (ref 0.5–1.5)
Methemoglobin: 1.2 % (ref 0.0–1.5)
O2 Saturation: 57.9 %
Total hemoglobin: 9.9 g/dL — ABNORMAL LOW (ref 13.5–18.0)

## 2013-04-18 LAB — BASIC METABOLIC PANEL
BUN: 76 mg/dL — ABNORMAL HIGH (ref 6–23)
Chloride: 88 mEq/L — ABNORMAL LOW (ref 96–112)
Creatinine, Ser: 1.46 mg/dL — ABNORMAL HIGH (ref 0.50–1.35)
GFR calc Af Amer: 55 mL/min — ABNORMAL LOW (ref >90)
Glucose, Bld: 157 mg/dL — ABNORMAL HIGH (ref 70–99)
Potassium: 3.8 mEq/L (ref 3.5–5.1)

## 2013-04-18 LAB — GLUCOSE, CAPILLARY: Glucose-Capillary: 148 mg/dL — ABNORMAL HIGH (ref 70–99)

## 2013-04-18 MED ORDER — INSULIN ASPART 100 UNIT/ML ~~LOC~~ SOLN
0.0000 [IU] | Freq: Three times a day (TID) | SUBCUTANEOUS | Status: DC
  Administered 2013-04-18: 7 [IU] via SUBCUTANEOUS
  Administered 2013-04-19 (×3): 4 [IU] via SUBCUTANEOUS
  Administered 2013-04-20: 7 [IU] via SUBCUTANEOUS
  Administered 2013-04-20: 4 [IU] via SUBCUTANEOUS
  Administered 2013-04-20: 3 [IU] via SUBCUTANEOUS
  Administered 2013-04-21 (×2): 4 [IU] via SUBCUTANEOUS
  Administered 2013-04-21: 2 [IU] via SUBCUTANEOUS
  Administered 2013-04-22: 3 [IU] via SUBCUTANEOUS
  Administered 2013-04-22 – 2013-04-23 (×2): 4 [IU] via SUBCUTANEOUS
  Administered 2013-04-24: 3 [IU] via SUBCUTANEOUS
  Administered 2013-04-24: 4 [IU] via SUBCUTANEOUS
  Administered 2013-04-24: 3 [IU] via SUBCUTANEOUS
  Administered 2013-04-25 – 2013-04-27 (×7): 4 [IU] via SUBCUTANEOUS

## 2013-04-18 MED ORDER — SODIUM CHLORIDE 0.9 % IJ SOLN
10.0000 mL | Freq: Two times a day (BID) | INTRAMUSCULAR | Status: DC
  Administered 2013-04-18 – 2013-04-26 (×7): 10 mL

## 2013-04-18 MED ORDER — FENTANYL CITRATE 0.05 MG/ML IJ SOLN
INTRAMUSCULAR | Status: AC
  Filled 2013-04-18: qty 2

## 2013-04-18 MED ORDER — FENTANYL CITRATE 0.05 MG/ML IJ SOLN
50.0000 ug | Freq: Once | INTRAMUSCULAR | Status: AC
  Administered 2013-04-18: 50 ug via INTRAVENOUS

## 2013-04-18 MED ORDER — SODIUM CHLORIDE 0.9 % IJ SOLN
10.0000 mL | INTRAMUSCULAR | Status: DC | PRN
  Administered 2013-04-27: 20 mL

## 2013-04-18 MED ORDER — DOPAMINE-DEXTROSE 3.2-5 MG/ML-% IV SOLN
3.0000 ug/kg/min | INTRAVENOUS | Status: DC
  Administered 2013-04-19 – 2013-04-25 (×5): 3 ug/kg/min via INTRAVENOUS
  Filled 2013-04-18 (×5): qty 250

## 2013-04-18 MED ORDER — PHENAZOPYRIDINE HCL 200 MG PO TABS
200.0000 mg | ORAL_TABLET | Freq: Every day | ORAL | Status: AC
  Administered 2013-04-18 – 2013-04-19 (×2): 200 mg via ORAL
  Filled 2013-04-18 (×2): qty 1

## 2013-04-18 NOTE — Progress Notes (Addendum)
Advanced Heart Failure Rounding Note   Subjective:   [name] is a 67 yo male with PMX s/f CAD (s/p CABG x 4 in 2001, normal Myoview 2010), chronic diastolic CHF/right HF, persistent a-fib, DM, HTN, HL, carotid artery disease, COPD, hypothyroidism, EtOH abuse, chronic venous insufficiency, morbid obesity and medical non-compliance. He is not on anticoagulants due to ETOH abuse and fall risk   EF 55-60% moderate RV dysfunction  Weight down 1 pound overnight (still up 30-35 lbs from baseline) despite 24hr I/O -3.3L on 20 mg lasix gtt, metolazone 5 mg BID and milrinone 0.25. Lowest dry weight on record was in 3/14 and was 232 lbs. Pending BMET. Ambulated 250 ft yesterday in the hall. Denies SOB, orthopnea or CP.   Objective:   Weight Range:  Vital Signs:   Temp:  [97.5 F (36.4 C)-98.5 F (36.9 C)] 97.7 F (36.5 C) (07/09 0732) Resp:  [20] 20 (07/09 0732) BP: (96-132)/(43-64) 96/43 mmHg (07/09 0330) SpO2:  [56 %-100 %] 95 % (07/09 0330) Weight:  [268 lb 1.3 oz (121.6 kg)] 268 lb 1.3 oz (121.6 kg) (07/09 0447) Last BM Date: 04/16/13  Weight change: Filed Weights   04/16/13 0800 04/17/13 0500 04/18/13 0447  Weight: 271 lb 13.2 oz (123.3 kg) 268 lb 11.9 oz (121.9 kg) 268 lb 1.3 oz (121.6 kg)    Intake/Output:   Intake/Output Summary (Last 24 hours) at 04/18/13 1610 Last data filed at 04/18/13 0600  Gross per 24 hour  Intake 1327.38 ml  Output   4805 ml  Net -3477.62 ml     Physical Exam: General: Chronically ill appearing. No resp difficulty; sitting on the side of bed eating breakfast HEENT: normal  Neck: supple. JVP difficult to assess d/t body habitus; carotids 2+ bilaterally; no bruits. No lymphadenopathy or thryomegaly appreciated.  Cor: PMI normal. Irregular rate & rhythm. No rubs, gallops. 2/6 SEM RUSB  Lungs: decreased at bases  Abdomen: morbidly obese, soft, nontender, + distended. No bruits or masses. Good bowel sounds. Diffuse macular rash with desquamation around  pannus  Extremities: no cyanosis, clubbing, rash, LLE and RLE 3+ edema;  Neuro: alert & orientedx3, cranial nerves grossly intact. Moves all 4 extremities w/o difficulty. Affect pleasant.    Telemetry: AF 80s  Labs: Basic Metabolic Panel:  Recent Labs Lab 04/14/13 1320 04/14/13 1854 04/15/13 0105 04/16/13 0345 04/17/13 0458  NA 131*  --  135 135 133*  K 5.8*  --  4.2 4.0 5.2*  CL 92*  --  95* 94* 90*  CO2 29  --  33* 34* 35*  GLUCOSE 121*  --  170* 127* 127*  BUN 76*  --  77* 73* 75*  CREATININE 1.47* 1.35 1.29 1.45* 1.34  CALCIUM 9.1  --  9.3 8.8 9.1    Liver Function Tests:  Recent Labs Lab 04/14/13 1320  AST 42*  ALT 16  ALKPHOS 92  BILITOT 0.7  PROT 7.4  ALBUMIN 3.4*   No results found for this basename: LIPASE, AMYLASE,  in the last 168 hours No results found for this basename: AMMONIA,  in the last 168 hours  CBC:  Recent Labs Lab 04/14/13 1320 04/14/13 1854 04/16/13 0345 04/16/13 0930  WBC 7.0 7.2 6.8 6.4  NEUTROABS 4.4  --   --   --   HGB 11.3* 11.2* 9.8* 10.8*  HCT 36.1* 36.0* 33.3* 35.6*  MCV 85.7 85.5 87.4 87.0  PLT 126* 124* 129* 134*    Cardiac Enzymes:  Recent Labs Lab 04/14/13  1301 04/14/13 1854 04/15/13 0105  TROPONINI <0.30 <0.30 <0.30    BNP: BNP (last 3 results)  Recent Labs  08/30/12 0416 09/21/12 0555 04/14/13 1301  PROBNP 18070.0* 4036.0* 6847.0*     Other results:  Imaging: No results found.   Medications:     Scheduled Medications: . aspirin EC  325 mg Oral Daily  . carvedilol  9.375 mg Oral BID WC  .  ceFAZolin (ANCEF) IV  2 g Intravenous Q8H  . enoxaparin (LOVENOX) injection  50 mg Subcutaneous Q24H  . fluconazole  200 mg Oral Daily  . FLUoxetine  20 mg Oral Daily  . folic acid  1 mg Oral Daily  . gabapentin  300 mg Oral Daily  . hydrALAZINE  25 mg Oral TID  . insulin glargine  10 Units Subcutaneous QHS  . lactulose  30 g Oral Daily  . levothyroxine  150 mcg Oral QAC breakfast  . metolazone   5 mg Oral BID  . multivitamin with minerals  1 tablet Oral Daily  . nystatin   Topical TID  . pantoprazole  40 mg Oral Daily  . senna  2 tablet Oral Daily  . simvastatin  40 mg Oral QHS  . sodium chloride  3 mL Intravenous Q12H    Infusions: . furosemide (LASIX) infusion 20 mg/hr (04/17/13 1839)  . milrinone 0.25 mcg/kg/min (04/18/13 0249)    PRN Medications: sodium chloride, acetaminophen, fluticasone, ondansetron (ZOFRAN) IV, oxyCODONE, sodium chloride   Assessment:   1. A/C diastolic heart failure with anasarca  - EF 55-60% RV moderately dilated/HK with RV-RA gradient 56 mmHg  2. Right heart failure  3. A Fib, chronic - no anticoagulation due to ETOH and falls  4. Hypothyroidism  5. Morbid obesity  6. CAD s/p CABG  7. ETOH abuse  8. DM2  9. Medical noncompliance  10. Panniculitis  11. Acute Kidney Injury - probably cardiorenal syndrome  12. Hypernatremia/Hypokalemia   Plan/Discussion:    Weight loss sluggish despite aggressive regimen and massive volume overload. BMET pending. Will watch intake closely. Will place PICC and check co-ox and CVPs. May benefit from switching milrinone to low-dose dopamine. UF also an option. Need to get volume down to baseline prior to d/c or he will be right back.   Continue to work with PT and CR. Continue wound care to pannus.   Patient is followed by [facility] and wants to go home with home health. Will need to discuss with daughter whether this is the best option for patient, I believe the best option is SNF.   Length of Stay: 4 Samarah Hogle,MD 8:54 AM    Advanced Heart Failure Team Pager [phone] (M-F; 7a - 4p)  Please contact [facility] for night-coverage after hours (4p -7a ) and weekends on [email or web address]

## 2013-04-18 NOTE — Progress Notes (Signed)
Peripherally Inserted Central Catheter/Midline Placement  The IV Nurse has discussed with the patient and/or persons authorized to consent for the patient, the purpose of this procedure and the potential benefits and risks involved with this procedure.  The benefits include less needle sticks, lab draws from the catheter and patient may be discharged home with the catheter.  Risks include, but not limited to, infection, bleeding, blood clot (thrombus formation), and puncture of an artery; nerve damage and irregular heat beat.  Alternatives to this procedure were also discussed.  PICC/Midline Placement Documentation        [name], Lajean Manes 04/18/2013, 5:10 PM

## 2013-04-18 NOTE — Progress Notes (Signed)
Inpatient Diabetes Program Recommendations  AACE/ADA: New Consensus Statement on Inpatient Glycemic Control  Target Ranges:  Prepandial:   less than 140 mg/dL      Peak postprandial:   less than 180 mg/dL (1-2 hours)      Critically ill patients:  140 - 180 mg/dL  Pager:  [phone] Hours:  8 am-10pm   Reason for Visit: History of Diabetes  Inpatient Diabetes Program Recommendations Correction (SSI): Add Novolog correction ACHS HgbA1C: Check HgbA1C  Alfredia Client PhD, RN, BC-ADM Diabetes Coordinator  Office:  [phone] Team Pager:  [phone]

## 2013-04-18 NOTE — Progress Notes (Signed)
CARDIAC REHAB PHASE I   PRE:  Rate/Rhythm: 72 Afib  BP:  Supine: 103/47  Sitting:   Standing:    SaO2: 100 4L  MODE:  Ambulation: 250 ft   POST:  Rate/Rhythm: 83 Afib  BP:  Supine:   Sitting: To BSC  Standing:    SaO2:  0942-1020 Assisted X 3 to ambulate used walker and O2 2L. Gait steady with walking pt c/o of left leg weakness. When pt got in hallway right foot last toe was bleeding. Pt states that his toenail is loose and is coming off. Sat pt in chair in hall and cleaned foot and applied clean dressing. Pt was able to walk 250 feet. Placed pt on [facility] after walk with call light in reach to call for assistance when finished.  Melina Copa RN 04/18/2013 10:18 AM

## 2013-04-18 NOTE — Clinical Social Work Psychosocial (Signed)
     Clinical Social Work Department BRIEF PSYCHOSOCIAL ASSESSMENT 04/18/2013  Patient:  [name], [name]     Account Number:  [id number]     Admit date:  04/14/2013  Clinical Social Worker:  Hulan Fray  Date/Time:  04/18/2013 03:31 PM  Referred by:  Care Management  Date Referred:  04/18/2013 Referred for  SNF Placement   Other Referral:   Interview type:  Patient Other interview type:    PSYCHOSOCIAL DATA Living Status:  FAMILY Admitted from facility:   Level of care:   Primary support name:  Merlyn Lot Primary support relationship to patient:  CHILD, ADULT Degree of support available:   supportive    CURRENT CONCERNS Current Concerns  Post-Acute Placement   Other Concerns:    SOCIAL WORK ASSESSMENT / PLAN Clinical Social Worker received referral from [facility] regarding patient potentially needing SNF placement at discharge. CSW introduced self and explained reason for visit. CSW provided SNF packet and explained SNF process. Patient reported that he has previously been to [facility] for about "3 months." Patient declined CSW initiated SNF process, as he wants to return home with home health services. Patient reported that he lives with his daughter and her husband. CSW will notify CM of request for returning home with home health. CSW will sign off, as social work intervention is no longer needed.   Assessment/plan status:  No Further Intervention Required Other assessment/ plan:   Information/referral to community resources:   SNF packet was given    PATIENTS/FAMILYS RESPONSE TO PLAN OF CARE: Patient declined SNF as disposition for discharge and wants to return home with home health. Patient was appreciative of CSW's visit.

## 2013-04-19 LAB — CARBOXYHEMOGLOBIN
Carboxyhemoglobin: 2.4 % — ABNORMAL HIGH (ref 0.5–1.5)
Methemoglobin: 1.3 % (ref 0.0–1.5)
O2 Saturation: 70.5 %
Total hemoglobin: 9.6 g/dL — ABNORMAL LOW (ref 13.5–18.0)

## 2013-04-19 LAB — HEMOGLOBIN A1C
Hgb A1c MFr Bld: 6.7 % — ABNORMAL HIGH (ref <5.7)
Mean Plasma Glucose: 146 mg/dL — ABNORMAL HIGH (ref <117)

## 2013-04-19 LAB — BASIC METABOLIC PANEL
CO2: 36 mEq/L — ABNORMAL HIGH (ref 19–32)
Calcium: 9.3 mg/dL (ref 8.4–10.5)
Calcium: 9.6 mg/dL (ref 8.4–10.5)
GFR calc Af Amer: >90 mL/min (ref >90)
GFR calc Af Amer: 61 mL/min — ABNORMAL LOW (ref >90)
GFR calc non Af Amer: >90 mL/min (ref >90)
GFR calc non Af Amer: 52 mL/min — ABNORMAL LOW (ref >90)
Glucose, Bld: 209 mg/dL — ABNORMAL HIGH (ref 70–99)
Potassium: 3.3 mEq/L — ABNORMAL LOW (ref 3.5–5.1)
Sodium: 132 mEq/L — ABNORMAL LOW (ref 135–145)
Sodium: 131 mEq/L — ABNORMAL LOW (ref 135–145)

## 2013-04-19 LAB — GLUCOSE, CAPILLARY
Glucose-Capillary: 178 mg/dL — ABNORMAL HIGH (ref 70–99)
Glucose-Capillary: 170 mg/dL — ABNORMAL HIGH (ref 70–99)

## 2013-04-19 MED ORDER — POTASSIUM CHLORIDE CRYS ER 20 MEQ PO TBCR
40.0000 meq | EXTENDED_RELEASE_TABLET | Freq: Two times a day (BID) | ORAL | Status: DC
  Administered 2013-04-19 – 2013-04-23 (×10): 40 meq via ORAL
  Filled 2013-04-19 (×12): qty 2

## 2013-04-19 NOTE — Progress Notes (Signed)
Physical Therapy Treatment Patient Details Name: [name] MRN: [id number] DOB: 1946-08-12 Today's Date: 04/19/2013 Time: 3086-5784 PT Time Calculation (min): 27 min  PT Assessment / Plan / Recommendation  PT Comments   Pt able to increase ambulation distance but needed 2 standing rest breaks during ambulation.    Follow Up Recommendations  Home health PT;Supervision/Assistance - 24 hour     Equipment Recommendations  Rolling walker with 5" wheels    Frequency Min 3X/week   Progress towards PT Goals Progress towards PT goals: Progressing toward goals  Plan Current plan remains appropriate    Precautions / Restrictions Precautions Precautions: Fall Restrictions Weight Bearing Restrictions: No   Pertinent Vitals/Pain No c/o pain; vitals maintained on 2L O2     Mobility  Bed Mobility Bed Mobility: Supine to Sit;Sit to Supine Supine to Sit: 4: Min guard;With rails Sit to Supine: 4: Min guard;With rail Details for Bed Mobility Assistance: Minguard for safety with cues for hand placement Transfers Transfers: Sit to Stand;Stand to Sit Sit to Stand: 4: Min assist;From elevated surface;With armrests;From bed Stand to Sit: 4: Min assist;To bed Details for Transfer Assistance: (A) to initaite transfer with cues for hand placement Ambulation/Gait Ambulation/Gait Assistance: 4: Min assist Ambulation Distance (Feet): 200 Feet Assistive device: Rolling walker Ambulation/Gait Assistance Details: (A) to manage RW with cues for RW placement and body position within RW. Pt needed 2 standing rest breaks. Gait Pattern: Step-to pattern;Trunk flexed;Wide base of support Gait velocity: slow    Exercises     PT Diagnosis:    PT Problem List:   PT Treatment Interventions:     PT Goals (current goals can now be found in the care plan section) Acute Rehab PT Goals Patient Stated Goal: home PT Goal Formulation: With patient Time For Goal Achievement: 05/01/13 Potential to Achieve  Goals: Good  Visit Information  Last PT Received On: 04/19/13 Assistance Needed: +1 (+2 helpful with line) History of Present Illness: Pt admitted with fluid overload and AC diastolic HF.    Subjective Data  Subjective: I guess I'll get up again.  I really would like to sleep. Patient Stated Goal: home   Cognition  Cognition Arousal/Alertness: Awake/alert Behavior During Therapy: WFL for tasks assessed/performed Overall Cognitive Status: Within Functional Limits for tasks assessed    Balance     End of Session PT - End of Session Equipment Utilized During Treatment: Gait belt;Oxygen Activity Tolerance: Patient limited by fatigue Patient left: in bed;with call bell/phone within reach Nurse Communication: Mobility status   GP     Rhandi Despain 04/19/2013, 4:27 PM  Jake Shark, PT DPT [phone]

## 2013-04-19 NOTE — Progress Notes (Addendum)
CARDIAC REHAB PHASE I   PRE:  Rate/Rhythm: 78 afib    BP: sitting 131/61    SaO2: 89 2L, 90 4L  MODE:  Ambulation: 186 ft   POST:  Rate/Rhythm: 90 afib    BP: sitting 157/90     SaO2: 94 6L  Pt needing increased O2 this am. 88-89 2L on EOB. Began with 4L for walking and pulse ox showed 80s after 80 ft so increased to 6L. However in retrospect question if pulse ox was accurate. Pt maintained mid 90s on 6L walking. Pt c/o increased fatigue today with weak legs. To recliner. SaO2 89-93 on 4L in recliner. BP up after walk. Will f/u, PT to see today. [id number]   Harriet Masson CES, ACSM 04/19/2013 9:24 AM

## 2013-04-19 NOTE — Progress Notes (Signed)
Orthopedic Tech Progress Note Patient Details:  [name] [birth year] [id number] Bilateral unna boots applied to LE's Ortho Devices Type of Ortho Device: Radio broadcast assistant Ortho Device/Splint Location: Bilateral Ortho Device/Splint Interventions: Application   [name] 04/19/2013, 10:23 AM

## 2013-04-19 NOTE — Progress Notes (Signed)
Advanced Heart Failure Rounding Note   Subjective:   [name] is a 67 yo male with PMX s/f CAD (s/p CABG x 4 in 2001, normal Myoview 2010), chronic diastolic CHF/right HF, persistent a-fib, DM, HTN, HL, carotid artery disease, COPD, hypothyroidism, EtOH abuse, chronic venous insufficiency, morbid obesity and medical non-compliance. He is not on anticoagulants due to ETOH abuse and fall risk  EF 55-60% moderate RV dysfunction  PICC placed last night and milrinone switched to dopamine. Weight down half a pound. Co-ox 71%. CVP 16 (checked personally)  Objective:   Weight Range:  Vital Signs:   Temp:  [97.4 F (36.3 C)-98 F (36.7 C)] 98 F (36.7 C) (07/10 0030) Resp:  [18] 18 (07/09 1522) BP: (103-137)/(39-64) 137/57 mmHg (07/10 0435) SpO2:  [89 %-99 %] 93 % (07/10 0435) Weight:  [121.5 kg (267 lb 13.7 oz)] 121.5 kg (267 lb 13.7 oz) (07/10 0500) Last BM Date: 04/16/13  Weight change: Filed Weights   04/17/13 0500 04/18/13 0447 04/19/13 0500  Weight: 121.9 kg (268 lb 11.9 oz) 121.6 kg (268 lb 1.3 oz) 121.5 kg (267 lb 13.7 oz)    Intake/Output:   Intake/Output Summary (Last 24 hours) at 04/19/13 0756 Last data filed at 04/19/13 0700  Gross per 24 hour  Intake 1941.75 ml  Output   3200 ml  Net -1258.25 ml     Physical Exam: General: Chronically ill appearing. No resp difficulty; sleeping soundly HEENT: normal  Neck: supple. JVP difficult to assess d/t body habitus; carotids 2+ bilaterally; no bruits. No lymphadenopathy or thryomegaly appreciated.  Cor: PMI normal. Irregular rate & rhythm. No rubs, gallops. 2/6 SEM RUSB  Lungs: decreased at bases  Abdomen: morbidly obese, soft, nontender, + distended. No bruits or masses. Good bowel sounds. Diffuse macular rash with desquamation around pannus  Extremities: no cyanosis, clubbing, rash, LLE and RLE 3+ edema;  Neuro: alert & orientedx3, cranial nerves grossly intact. Moves all 4 extremities w/o difficulty. Affect pleasant.     Telemetry: AF 80s  Labs: Basic Metabolic Panel:  Recent Labs Lab 04/15/13 0105 04/16/13 0345 04/17/13 0458 04/18/13 1030 04/19/13 0440  NA 135 135 133* 133* 132*  K 4.2 4.0 5.2* 3.8 3.3*  CL 95* 94* 90* 88* 86*  CO2 33* 34* 35* 36* 34*  GLUCOSE 170* 127* 127* 157* 209*  BUN 77* 73* 75* 76* 73*  CREATININE 1.29 1.45* 1.34 1.46* 0.36*  CALCIUM 9.3 8.8 9.1 9.9 9.3    Liver Function Tests:  Recent Labs Lab 04/14/13 1320  AST 42*  ALT 16  ALKPHOS 92  BILITOT 0.7  PROT 7.4  ALBUMIN 3.4*   No results found for this basename: LIPASE, AMYLASE,  in the last 168 hours No results found for this basename: AMMONIA,  in the last 168 hours  CBC:  Recent Labs Lab 04/14/13 1320 04/14/13 1854 04/16/13 0345 04/16/13 0930  WBC 7.0 7.2 6.8 6.4  NEUTROABS 4.4  --   --   --   HGB 11.3* 11.2* 9.8* 10.8*  HCT 36.1* 36.0* 33.3* 35.6*  MCV 85.7 85.5 87.4 87.0  PLT 126* 124* 129* 134*    Cardiac Enzymes:  Recent Labs Lab 04/14/13 1301 04/14/13 1854 04/15/13 0105  TROPONINI <0.30 <0.30 <0.30    BNP: BNP (last 3 results)  Recent Labs  08/30/12 0416 09/21/12 0555 04/14/13 1301  PROBNP 18070.0* 4036.0* 6847.0*     Other results:  Imaging: Dg Chest Port 1 View  04/18/2013   *RADIOLOGY REPORT*  Clinical Data:  PICC line placement.  PORTABLE CHEST - 1 VIEW  Comparison: 04/14/2013  Findings: Left PICC line is in place with the tip in the lower SVC. Prior CABG.  Diffuse interstitial prominence of the lungs, likely interstitial edema.  Mild cardiomegaly.  Small effusions.  IMPRESSION: Left PICC line tip in the lower SVC.  Mild CHF.   Original Report Authenticated By: Charlett Nose, M.D.     Medications:     Scheduled Medications: . aspirin EC  325 mg Oral Daily  . carvedilol  9.375 mg Oral BID WC  .  ceFAZolin (ANCEF) IV  2 g Intravenous Q8H  . enoxaparin (LOVENOX) injection  50 mg Subcutaneous Q24H  . fluconazole  200 mg Oral Daily  . FLUoxetine  20 mg Oral  Daily  . folic acid  1 mg Oral Daily  . gabapentin  300 mg Oral Daily  . hydrALAZINE  25 mg Oral TID  . insulin aspart  0-20 Units Subcutaneous TID WC  . insulin glargine  10 Units Subcutaneous QHS  . lactulose  30 g Oral Daily  . levothyroxine  150 mcg Oral QAC breakfast  . metolazone  5 mg Oral BID  . multivitamin with minerals  1 tablet Oral Daily  . nystatin   Topical TID  . pantoprazole  40 mg Oral Daily  . phenazopyridine  200 mg Oral Daily  . senna  2 tablet Oral Daily  . simvastatin  40 mg Oral QHS  . sodium chloride  10-40 mL Intracatheter Q12H  . sodium chloride  3 mL Intravenous Q12H    Infusions: . DOPamine 2.982 mcg/kg/min (04/19/13 0700)  . furosemide (LASIX) infusion 20 mg/hr (04/19/13 0700)    PRN Medications: sodium chloride, acetaminophen, fluticasone, ondansetron (ZOFRAN) IV, oxyCODONE, sodium chloride, sodium chloride   Assessment:   1. A/C diastolic and right heart failure with anasarca  - EF 55-60% RV moderately dilated/HK with RV-RA gradient 56 mmHg  2. Right heart failure  3. A Fib, chronic - no anticoagulation due to ETOH and falls  4. Hypothyroidism  5. Morbid obesity  6. CAD s/p CABG  7. ETOH abuse  8. DM2  9. Medical noncompliance  10. Panniculitis  11. Acute Kidney Injury - probably cardiorenal syndrome  12. Hypernatremia/Hypokalemia   Plan/Discussion:    He has severe diastolic and right heart failure. Weight loss sluggish despite aggressive regimen and massive volume overload. He refuses UF.INcrease lasix to 30/hr and place UNNA boots.  Continue to work with PT and CR. Continue wound care to pannus.   Patient is followed by [facility] and wants to go home with home health. Will need to discuss with daughter whether this is the best option for patient, I still believe the best option is SNF but he refuses.   Length of Stay: 5 Perel Hauschild,MD 7:56 AM Advanced Heart Failure Team Pager [phone] (M-F; 7a - 4p)  Please contact  [facility] for night-coverage after hours (4p -7a ) and weekends on [email or web address]

## 2013-04-20 LAB — BASIC METABOLIC PANEL
BUN: 73 mg/dL — ABNORMAL HIGH (ref 6–23)
Calcium: 9.4 mg/dL (ref 8.4–10.5)
Creatinine, Ser: 0.39 mg/dL — ABNORMAL LOW (ref 0.50–1.35)
GFR calc Af Amer: >90 mL/min (ref >90)

## 2013-04-20 LAB — GLUCOSE, CAPILLARY
Glucose-Capillary: 135 mg/dL — ABNORMAL HIGH (ref 70–99)
Glucose-Capillary: 174 mg/dL — ABNORMAL HIGH (ref 70–99)
Glucose-Capillary: 201 mg/dL — ABNORMAL HIGH (ref 70–99)

## 2013-04-20 LAB — CARBOXYHEMOGLOBIN: Total hemoglobin: 10.3 g/dL — ABNORMAL LOW (ref 13.5–18.0)

## 2013-04-20 MED ORDER — SPIRONOLACTONE 25 MG PO TABS
25.0000 mg | ORAL_TABLET | Freq: Every day | ORAL | Status: DC
  Administered 2013-04-20 – 2013-04-27 (×8): 25 mg via ORAL
  Filled 2013-04-20 (×8): qty 1

## 2013-04-20 NOTE — Clinical Social Work Placement (Addendum)
     Clinical Social Work Department CLINICAL SOCIAL WORK PLACEMENT NOTE 04/27/2013  Patient:  [name], [name]  Account Number:  [id number] Admit date:  04/14/2013  Clinical Social Worker:  Rozetta Nunnery, Theresia Majors  Date/time:  04/20/2013 02:40 PM  Clinical Social Work is seeking post-discharge placement for this patient at the following level of care:   SKILLED NURSING   (*CSW will update this form in Epic as items are completed)   04/20/2013  Patient/family provided with [facility] Department of Clinical Social Works list of facilities offering this level of care within the geographic area requested by the patient (or if unable, by the patients family).  04/20/2013  Patient/family informed of their freedom to choose among providers that offer the needed level of care, that participate in Medicare, Medicaid or managed care program needed by the patient, have an available bed and are willing to accept the patient.  04/20/2013  Patient/family informed of [facility] ownership interest in [facility], as well as of the fact that they are under no obligation to receive care at this facility.  PASARR submitted to EDS on 04/08/2010 PASARR number received from EDS on 04/08/2010  FL2 transmitted to all facilities in geographic area requested by pt/family on  04/20/2013 FL2 transmitted to all facilities within larger geographic area on   Patient informed that his/her managed care company has contracts with or will negotiate with  certain facilities, including the following:     Patient/family informed of bed offers received:  04/27/2013 Patient chooses bed at [facility] [place] Physician recommends and patient chooses bed at    Patient to be transferred to [facility] on  04/27/2013 Patient to be transferred to facility by Ambulance Sharin Mons)  The following physician request were entered in Epic:   Additional Comments: 04/27/13  OK per MD for  d/c today to SNF. Ok per facility, patinet, family and nursing who will call report.  No futher CSW needs identified. CSW signing off.  Lorri Frederick. Yusuf Yu, LCSWA  [phone]

## 2013-04-20 NOTE — Progress Notes (Signed)
Assumed care for this 7a-7p shift. Pt resting in bed, easily awakes to stimulus. No s/s of distress noted. Vitals and assessment as documented.

## 2013-04-20 NOTE — Progress Notes (Signed)
CARDIAC REHAB PHASE I   PRE:  Rate/Rhythm: 73afib  BP:  Supine:   Sitting: 137/75,  132/53  Standing:    SaO2: 98%4L  MODE:  Ambulation: 186 ft   POST:  Rate/Rhythm: 74afib  BP:  Supine:   Sitting: 139/78  Standing:    SaO2: 92%4L 0914-1000 Pt walked 186 ft on 4L with rolling walker and asst x 2. Stopped several times to rest. C/o right hip pain. To recliner after walk. Family in room. Encouraged pt to work with PT later.   [name], RN BSN  04/20/2013 9:53 AM

## 2013-04-20 NOTE — Clinical Social Work Note (Signed)
Clinical Social Worker was reconsulted for need for patient to received short term SNF placement at discharge. Per chart review patient is now agreeable for SNF placement with preference for [facility] if available. CSW spoke with daughter and she confirmed preference for [facility] SN[facility]. Per daughter, patient was previously at [facility] from Jan. 5, 2013- December 24, 2011. CSW will initiate referral to facility and update patient and family when response is made.   Rozetta Nunnery MSW, [employer] [phone]

## 2013-04-20 NOTE — Progress Notes (Addendum)
Advanced Heart Failure Rounding Note   Subjective:   Mr. Pilley is a 67 yo male with PMX s/f CAD (s/p CABG x 4 in 2001, normal Myoview 2010), chronic diastolic CHF/right HF, persistent a-fib, DM, HTN, HL, carotid artery disease, COPD, hypothyroidism, EtOH abuse, chronic venous insufficiency, morbid obesity and medical non-compliance. He is not on anticoagulants due to ETOH abuse and fall risk  EF 55-60% moderate RV dysfunction. Admitted with marked fluid overload.   Weight down 1 lb, 24 hr I/O -2.3 liters. Patient refuses UF. Lasix gtt increased to 30 mg/hr and remains on dopamine at 3 mcg.   Co-68% CVP 15  Objective:   Weight Range:  Vital Signs:   Temp:  [97.8 F (36.6 C)-98.1 F (36.7 C)] 97.8 F (36.6 C) (07/11 0800) Pulse Rate:  [69-81] 81 (07/11 0800) Resp:  [18] 18 (07/10 1259) BP: (108-147)/(53-86) 137/75 mmHg (07/11 0800) SpO2:  [90 %-99 %] 91 % (07/11 0800) Weight:  [121.1 kg (266 lb 15.6 oz)] 121.1 kg (266 lb 15.6 oz) (07/11 0308) Last BM Date: 04/16/13  Weight change: Filed Weights   04/18/13 0447 04/19/13 0500 04/20/13 0308  Weight: 121.6 kg (268 lb 1.3 oz) 121.5 kg (267 lb 13.7 oz) 121.1 kg (266 lb 15.6 oz)    Intake/Output:   Intake/Output Summary (Last 24 hours) at 04/20/13 0905 Last data filed at 04/20/13 0745  Gross per 24 hour  Intake 1159.6 ml  Output   4100 ml  Net -2940.4 ml     Physical Exam: General: Chronically ill appearing. No resp difficulty; eating breakfast on the side of bed HEENT: normal  Neck: supple. JVP difficult to assess d/t body habitus; carotids 2+ bilaterally; no bruits. No lymphadenopathy or thryomegaly appreciated.  Cor: PMI normal. Irregular rate & rhythm. No rubs, gallops. 2/6 SEM RUSB  Lungs: CTA Abdomen: morbidly obese, soft, nontender, + distended. No bruits or masses. Good bowel sounds. Diffuse macular rash with desquamation around pannus  Extremities: no cyanosis, clubbing, rash, LLE and RLE 2+ edema; UNNA boots  intact Neuro: alert & orientedx3, cranial nerves grossly intact. Moves all 4 extremities w/o difficulty. Affect pleasant.    Telemetry: AF 70-80s  Labs: Basic Metabolic Panel:  Recent Labs Lab 04/17/13 0458 04/18/13 1030 04/19/13 0440 04/19/13 0807 04/20/13 0435  NA 133* 133* 132* 131* 130*  K 5.2* 3.8 3.3* 3.8 3.5  CL 90* 88* 86* 85* 85*  CO2 35* 36* 34* 36* 37*  GLUCOSE 127* 157* 209* 206* 169*  BUN 75* 76* 73* 75* 73*  CREATININE 1.34 1.46* 0.36* 1.34 0.39*  CALCIUM 9.1 9.9 9.3 9.6 9.4    Liver Function Tests:  Recent Labs Lab 04/14/13 1320  AST 42*  ALT 16  ALKPHOS 92  BILITOT 0.7  PROT 7.4  ALBUMIN 3.4*   No results found for this basename: LIPASE, AMYLASE,  in the last 168 hours No results found for this basename: AMMONIA,  in the last 168 hours  CBC:  Recent Labs Lab 04/14/13 1320 04/14/13 1854 04/16/13 0345 04/16/13 0930  WBC 7.0 7.2 6.8 6.4  NEUTROABS 4.4  --   --   --   HGB 11.3* 11.2* 9.8* 10.8*  HCT 36.1* 36.0* 33.3* 35.6*  MCV 85.7 85.5 87.4 87.0  PLT 126* 124* 129* 134*    Cardiac Enzymes:  Recent Labs Lab 04/14/13 1301 04/14/13 1854 04/15/13 0105  TROPONINI <0.30 <0.30 <0.30    BNP: BNP (last 3 results)  Recent Labs  08/30/12 0416 09/21/12 0555 04/14/13 1301  PROBNP 18070.0* 4036.0* 6847.0*     Other results:  Imaging: Dg Chest Port 1 View  04/18/2013   *RADIOLOGY REPORT*  Clinical Data: PICC line placement.  PORTABLE CHEST - 1 VIEW  Comparison: 04/14/2013  Findings: Left PICC line is in place with the tip in the lower SVC. Prior CABG.  Diffuse interstitial prominence of the lungs, likely interstitial edema.  Mild cardiomegaly.  Small effusions.  IMPRESSION: Left PICC line tip in the lower SVC.  Mild CHF.   Original Report Authenticated By: Charlett Nose, M.D.     Medications:     Scheduled Medications: . aspirin EC  325 mg Oral Daily  . carvedilol  9.375 mg Oral BID WC  .  ceFAZolin (ANCEF) IV  2 g Intravenous  Q8H  . enoxaparin (LOVENOX) injection  50 mg Subcutaneous Q24H  . FLUoxetine  20 mg Oral Daily  . folic acid  1 mg Oral Daily  . gabapentin  300 mg Oral Daily  . hydrALAZINE  25 mg Oral TID  . insulin aspart  0-20 Units Subcutaneous TID WC  . insulin glargine  10 Units Subcutaneous QHS  . lactulose  30 g Oral Daily  . levothyroxine  150 mcg Oral QAC breakfast  . metolazone  5 mg Oral BID  . multivitamin with minerals  1 tablet Oral Daily  . nystatin   Topical TID  . pantoprazole  40 mg Oral Daily  . potassium chloride  40 mEq Oral BID  . senna  2 tablet Oral Daily  . simvastatin  40 mg Oral QHS  . sodium chloride  10-40 mL Intracatheter Q12H  . sodium chloride  3 mL Intravenous Q12H    Infusions: . DOPamine 2.982 mcg/kg/min (04/20/13 0600)  . furosemide (LASIX) infusion 30 mg/hr (04/20/13 0600)    PRN Medications: sodium chloride, acetaminophen, fluticasone, ondansetron (ZOFRAN) IV, oxyCODONE, sodium chloride, sodium chloride   Assessment:   1. A/C diastolic and right heart failure with anasarca  - EF 55-60% RV moderately dilated/HK with RV-RA gradient 56 mmHg  2. Right heart failure  3. A Fib, chronic - no anticoagulation due to ETOH and falls  4. Hypothyroidism  5. Morbid obesity  6. CAD s/p CABG  7. ETOH abuse  8. DM2  9. Medical noncompliance  10. Panniculitis  11. Acute Kidney Injury - probably cardiorenal syndrome  12. Hypernatremia/Hypokalemia   Plan/Discussion:    Continues to diurese very sluggishly. Down 1 lb. Will continue lasix gtt at 30 mg/hr along with dopamine 3 mcg. Continue UNNA boots.   Refuses SNF. Continue ambulation.    Length of Stay: 6 Morocco Gipe,MD 9:05 AM Advanced Heart Failure Team Pager [phone] (M-F; 7a - 4p)  Please contact [facility] for night-coverage after hours (4p -7a ) and weekends on [email or web address]  Patient seen and examined with Ulla Potash, NP. We discussed all aspects of the encounter. I agree with the  assessment and plan as stated above. '  I am not sure why he is diuresing so sluggishly. He is now on high dose lasix. Renal function appears stable. CVP is up but this is confounded by his R heart failure and may not adequately represent his left sided filling pressures.   Finishing abx for panniculitis.   Over weekend would push diuresis as much as possible and not stop lasix until renal function worsens. May need diamox 500 bid if CO2 > 40.   I feel strongly he needs SNF. Long talk about it today and they have  now agreed to consider. Would not send until fluid is off. Would send on high dose demadex with close f/u using SNF HF order sets.  Shunte Senseney,MD 9:10 AM

## 2013-04-20 NOTE — Progress Notes (Signed)
Physical Therapy Treatment Patient Details Name: [name] MRN: [id number] DOB: Feb 26, 1946 Today's Date: 04/20/2013 Time: 4098-1191 PT Time Calculation (min): 24 min  PT Assessment / Plan / Recommendation  PT Comments   Pt able to increase ambulation distance however need two standing rest breaks due to overall fatigue.  Pt with very slow cadence and easily distracted during ambulation and needed cues to increase gait speed.  Pt will benefit from continue acute PT and recommend wide RW next session.    Follow Up Recommendations  Home health PT;Supervision/Assistance - 24 hour     Barriers to Discharge        Equipment Recommendations  Rolling walker with 5" wheels (wide)   Recommendations for Other Services    Frequency Min 3X/week   Progress towards PT Goals Progress towards PT goals: Progressing toward goals  Plan Current plan remains appropriate    Precautions / Restrictions Precautions Precautions: Fall   Pertinent Vitals/Pain C/o stiffness but does not rate; unable to get appropriate Sa02 read with pulse oximeter; will continue to assess     Mobility  Bed Mobility Bed Mobility: Supine to Sit;Sit to Supine Supine to Sit: 4: Min guard;With rails Details for Bed Mobility Assistance: Minguard for safety with cues for hand placement. Pt in deep sleep when entering had to give pt a few minutes to wake up prior to [employer] Transfers Transfers: Sit to Stand;Stand to Sit Sit to Stand: 4: Min assist;From elevated surface;With armrests;From bed Stand to Sit: 4: Min assist;To bed Details for Transfer Assistance: (A) to initaite transfer with cues for hand placement Ambulation/Gait Ambulation/Gait Assistance: 4: Min assist Ambulation Distance (Feet): 250 Feet Assistive device: Rolling walker Ambulation/Gait Assistance Details: (A) to manage RW.  Pt may benefit form wide RW next session due to wide BOS.  Pt with two standing rest breaks during ambulation.  Pt with very slow  cadence and cues to remain on task. Gait Pattern: Step-to pattern;Trunk flexed;Wide base of support Gait velocity: slow Stairs: No    Exercises     PT Diagnosis:    PT Problem List:   PT Treatment Interventions:     PT Goals (current goals can now be found in the care plan section) Acute Rehab PT Goals Patient Stated Goal: home PT Goal Formulation: With patient Time For Goal Achievement: 05/01/13 Potential to Achieve Goals: Good  Visit Information  Last PT Received On: 04/20/13 Assistance Needed: +1 History of Present Illness: Pt admitted with fluid overload and AC diastolic HF.    Subjective Data  Subjective: "I'm not doing so hot this afternoon. Patient Stated Goal: home   Cognition  Cognition Arousal/Alertness: Awake/alert Behavior During Therapy: WFL for tasks assessed/performed Overall Cognitive Status: Within Functional Limits for tasks assessed    Balance     End of Session PT - End of Session Equipment Utilized During Treatment: Gait belt;Oxygen Activity Tolerance: Patient limited by fatigue Patient left: in bed;with call bell/phone within reach (sitting EOB) Nurse Communication: Mobility status   GP     Vena Bassinger 04/20/2013, 4:15 PM Jake Shark, PT DPT [phone]

## 2013-04-20 NOTE — Care Management Note (Signed)
    Page 1 of 2   04/20/2013     10:53:28 AM   CARE MANAGEMENT NOTE 04/20/2013  Patient:  [name], [name]   Account Number:  [id number]  Date Initiated:  04/15/2013  Documentation initiated by:  DAVIS,TYMEEKA  Subjective/Objective Assessment:   67 yo male admitted with CHF. PTA pt from home with [facility] services. Pt resides with adult daughter and son-n-law.     Action/Plan:   Home when stable   Anticipated DC Date:     Anticipated DC Plan:  HOME W HOME HEALTH SERVICES  In-house referral  Clinical Social Worker      DC Planning Services  CM consult      [facility] Choice  Resumption Of Svcs/PTA Provider   Choice offered to / List presented to:  C-1 Patient   DME arranged  NA      DME agency  NA     HH arranged  HH-1 RN  HH-2 PT  HH-4 NURSE'S AIDE      HH agency  [facility]   Status of service:  Completed, signed off Medicare Important Message given?   (If response is "NO", the following Medicare IM given date fields will be blank) Date Medicare IM given:   Date Additional Medicare IM given:    Discharge Disposition:  SKILLED NURSING FACILITY  Per UR Regulation:    If discussed at Long Length of Stay Meetings, dates discussed:    Comments:  7/11  1051 debbie Birdie Beveridge rn,bsn spoke w pt and da dana hardy. they would like snf and [facility]. sw given dana's phone # and bed search started. may be ready first of week per family.  Sharmon Leyden, RN Registered Nurse Signed CASE MANAGEMENT Care Management Note Service date: 04/15/2013 1:44 PM Cm spoke with patient concerning discharge planning. With pt's permission Cm contacted adult daughter [name] on the phone concerning discharge planning at [phone]. Per daughter pt is currently active with [facility]. Pt would like to resume HHPT and add services of [facility] upon discharge. Pt has home C-pap machine provided by [facility]. No other dme requested. [facility] notified  of pt's admission. Awaiting MD orders for RN/PT/Aide     Leonie Green [phone]

## 2013-04-21 LAB — GLUCOSE, CAPILLARY
Glucose-Capillary: 156 mg/dL — ABNORMAL HIGH (ref 70–99)
Glucose-Capillary: 148 mg/dL — ABNORMAL HIGH (ref 70–99)

## 2013-04-21 LAB — CARBOXYHEMOGLOBIN
Methemoglobin: 0.7 % (ref 0.0–1.5)
O2 Saturation: 79 %
Total hemoglobin: 10.9 g/dL — ABNORMAL LOW (ref 13.5–18.0)

## 2013-04-21 LAB — BASIC METABOLIC PANEL
CO2: 39 mEq/L — ABNORMAL HIGH (ref 19–32)
Calcium: 9.1 mg/dL (ref 8.4–10.5)
Chloride: 82 mEq/L — ABNORMAL LOW (ref 96–112)
GFR calc Af Amer: >90 mL/min (ref >90)
Sodium: 126 mEq/L — ABNORMAL LOW (ref 135–145)

## 2013-04-21 MED ORDER — HYDROCORTISONE 1 % EX CREA
TOPICAL_CREAM | Freq: Three times a day (TID) | CUTANEOUS | Status: DC | PRN
  Filled 2013-04-21: qty 28

## 2013-04-21 MED ORDER — ACETAZOLAMIDE ER 500 MG PO CP12
500.0000 mg | ORAL_CAPSULE | Freq: Two times a day (BID) | ORAL | Status: AC
  Administered 2013-04-21 – 2013-04-24 (×8): 500 mg via ORAL
  Filled 2013-04-21 (×9): qty 1

## 2013-04-21 MED ORDER — TRAMADOL HCL 50 MG PO TABS
100.0000 mg | ORAL_TABLET | Freq: Three times a day (TID) | ORAL | Status: DC | PRN
  Administered 2013-04-21 – 2013-04-25 (×7): 100 mg via ORAL
  Filled 2013-04-21 (×9): qty 2

## 2013-04-21 NOTE — Progress Notes (Signed)
Report from Night RN. Chart reviewed together. Handoff complete.Introductions complete. Will continue to monitor and advise attending as needed.   

## 2013-04-21 NOTE — Progress Notes (Addendum)
Advanced Heart Failure Rounding Note   Subjective:   [name] is a 67 yo male with PMX s/f CAD (s/p CABG x 4 in 2001, normal Myoview 2010), chronic diastolic CHF/right HF, persistent a-fib, DM, HTN, HL, carotid artery disease, COPD, hypothyroidism, EtOH abuse, chronic venous insufficiency, morbid obesity and medical non-compliance. He is not on anticoagulants due to ETOH abuse and fall risk  EF 55-60% moderate RV dysfunction. Admitted with marked fluid overload. Baseline weight about 249  Weight down 2 lb, 24 hr I/O -2.1 liters. Now on lasix 30 mg/hr and remains on dopamine at 3 mcg. Sodium falling. Cr stable.   Co-ox 79% CVP 15  Objective:   Weight Range:  Vital Signs:   Temp:  [97.5 F (36.4 C)-99.2 F (37.3 C)] 99.2 F (37.3 C) (07/12 0403) Pulse Rate:  [65-81] 65 (07/11 1700) BP: (119-140)/(49-75) 120/70 mmHg (07/12 0403) SpO2:  [91 %-98 %] 97 % (07/12 0403) Weight:  [120.1 kg (264 lb 12.4 oz)] 120.1 kg (264 lb 12.4 oz) (07/12 0403) Last BM Date: 04/16/13  Weight change: Filed Weights   04/19/13 0500 04/20/13 0308 04/21/13 0403  Weight: 121.5 kg (267 lb 13.7 oz) 121.1 kg (266 lb 15.6 oz) 120.1 kg (264 lb 12.4 oz)    Intake/Output:   Intake/Output Summary (Last 24 hours) at 04/21/13 0636 Last data filed at 04/21/13 0600  Gross per 24 hour  Intake 1613.2 ml  Output   3725 ml  Net -2111.8 ml    CVP 17 Physical Exam: General: Chronically ill appearing. No resp difficulty; eating breakfast on the side of bed HEENT: normal  Neck: supple. JVP difficult to assess d/t body habitus; carotids 2+ bilaterally; no bruits. No lymphadenopathy or thryomegaly appreciated.  Cor: PMI normal. Irregular rate & rhythm. No rubs, gallops. 2/6 SEM RUSB  Lungs: CTA Abdomen: morbidly obese, soft, nontender, + distended. No bruits or masses. Good bowel sounds. Diffuse macular rash with desquamation around pannus  Extremities: no cyanosis, clubbing, rash, LLE and RLE + edema; UNNA boots  intact Neuro: alert & orientedx3, cranial nerves grossly intact. Moves all 4 extremities w/o difficulty. Affect pleasant.    Telemetry: AF 70-80s  Labs: Basic Metabolic Panel:  Recent Labs Lab 04/18/13 1030 04/19/13 0440 04/19/13 0807 04/20/13 0435 04/21/13 0400  NA 133* 132* 131* 130* 126*  K 3.8 3.3* 3.8 3.5 4.1  CL 88* 86* 85* 85* 82*  CO2 36* 34* 36* 37* 39*  GLUCOSE 157* 209* 206* 169* 273*  BUN 76* 73* 75* 73* 74*  CREATININE 1.46* 0.36* 1.34 0.39* 0.45*  CALCIUM 9.9 9.3 9.6 9.4 9.1    Liver Function Tests:  Recent Labs Lab 04/14/13 1320  AST 42*  ALT 16  ALKPHOS 92  BILITOT 0.7  PROT 7.4  ALBUMIN 3.4*   No results found for this basename: LIPASE, AMYLASE,  in the last 168 hours No results found for this basename: AMMONIA,  in the last 168 hours  CBC:  Recent Labs Lab 04/14/13 1320 04/14/13 1854 04/16/13 0345 04/16/13 0930  WBC 7.0 7.2 6.8 6.4  NEUTROABS 4.4  --   --   --   HGB 11.3* 11.2* 9.8* 10.8*  HCT 36.1* 36.0* 33.3* 35.6*  MCV 85.7 85.5 87.4 87.0  PLT 126* 124* 129* 134*    Cardiac Enzymes:  Recent Labs Lab 04/14/13 1301 04/14/13 1854 04/15/13 0105  TROPONINI <0.30 <0.30 <0.30    BNP: BNP (last 3 results)  Recent Labs  08/30/12 0416 09/21/12 0555 04/14/13 1301  PROBNP 18070.0* 4036.0* 6847.0*     Other results:  Imaging: No results found.   Medications:     Scheduled Medications: . aspirin EC  325 mg Oral Daily  . carvedilol  9.375 mg Oral BID WC  . enoxaparin (LOVENOX) injection  50 mg Subcutaneous Q24H  . FLUoxetine  20 mg Oral Daily  . folic acid  1 mg Oral Daily  . gabapentin  300 mg Oral Daily  . hydrALAZINE  25 mg Oral TID  . insulin aspart  0-20 Units Subcutaneous TID WC  . insulin glargine  10 Units Subcutaneous QHS  . lactulose  30 g Oral Daily  . levothyroxine  150 mcg Oral QAC breakfast  . metolazone  5 mg Oral BID  . multivitamin with minerals  1 tablet Oral Daily  . nystatin   Topical TID   . pantoprazole  40 mg Oral Daily  . potassium chloride  40 mEq Oral BID  . senna  2 tablet Oral Daily  . simvastatin  40 mg Oral QHS  . sodium chloride  10-40 mL Intracatheter Q12H  . sodium chloride  3 mL Intravenous Q12H  . spironolactone  25 mg Oral Daily    Infusions: . DOPamine 2.982 mcg/kg/min (04/21/13 0600)  . furosemide (LASIX) infusion 30 mg/hr (04/21/13 0600)    PRN Medications: sodium chloride, acetaminophen, fluticasone, ondansetron (ZOFRAN) IV, oxyCODONE, sodium chloride, sodium chloride   Assessment:   1. A/C diastolic and right heart failure with anasarca  - EF 55-60% RV moderately dilated/HK with RV-RA gradient 56 mmHg  2. Right heart failure  3. A Fib, chronic - no anticoagulation due to ETOH and falls  4. Hypothyroidism  5. Morbid obesity  6. CAD s/p CABG  7. ETOH abuse  8. DM2  9. Medical noncompliance  10. Panniculitis  11. Acute Kidney Injury - probably cardiorenal syndrome  12. Hyponatremia/Hypokalemia   Plan/Discussion:    Continues to diurese sluggishly. He is now on high dose lasix. Renal function appears stable. CVP is up but this is confounded by his R heart failure and may not adequately represent his left sided filling pressures. Refuses UF.  Finished abx for panniculitis.   Over weekend would push diuresis as much as possible and not stop lasix until renal function worsens. Will start diamox 500 bid.  He continue to request pain meds. I told him i would not give him narcotics (has ongoing ETOH abuse). Will start ultram prn.   He agrees to SNF. I have filled out FL-2. We have discussed [facility] so we can monitor more closely in HF program.  [name] 6:36 AM

## 2013-04-21 NOTE — Progress Notes (Signed)
CARDIAC REHAB PHASE I   PRE:  Rate/Rhythm: Atrial Fib 65  BP:  Supine:   Sitting: 120/56  Standing:   SaO2: 95 4L/min  MODE:  Ambulation: 186 ft   POST:  Rate/Rhythem: Atrial Fib 74  BP:  Supine:   Sitting: 135/68  Standing:    SaO2: 93 4L/min  [id number]  Patient ambulated with assistance times 2 using rolling walker.  Patient stopped times 2 to rest. Patient refused to sit in the chair.  Assisted back to bed with call light within reach.  Harlon Flor, Arta Bruce

## 2013-04-22 LAB — BASIC METABOLIC PANEL
Calcium: 9.8 mg/dL (ref 8.4–10.5)
GFR calc Af Amer: 57 mL/min — ABNORMAL LOW (ref >90)
GFR calc non Af Amer: 49 mL/min — ABNORMAL LOW (ref >90)
Glucose, Bld: 147 mg/dL — ABNORMAL HIGH (ref 70–99)
Potassium: 4.3 mEq/L (ref 3.5–5.1)
Sodium: 130 mEq/L — ABNORMAL LOW (ref 135–145)

## 2013-04-22 LAB — GLUCOSE, CAPILLARY
Glucose-Capillary: 130 mg/dL — ABNORMAL HIGH (ref 70–99)
Glucose-Capillary: 173 mg/dL — ABNORMAL HIGH (ref 70–99)
Glucose-Capillary: 142 mg/dL — ABNORMAL HIGH (ref 70–99)

## 2013-04-22 LAB — CARBOXYHEMOGLOBIN
Carboxyhemoglobin: 2.3 % — ABNORMAL HIGH (ref 0.5–1.5)
O2 Saturation: 69.6 %
Total hemoglobin: 10 g/dL — ABNORMAL LOW (ref 13.5–18.0)

## 2013-04-22 MED ORDER — WHITE PETROLATUM GEL
Status: DC | PRN
  Filled 2013-04-22: qty 5

## 2013-04-22 MED ORDER — FLUTICASONE PROPIONATE HFA 220 MCG/ACT IN AERO
1.0000 | INHALATION_SPRAY | Freq: Two times a day (BID) | RESPIRATORY_TRACT | Status: DC
  Administered 2013-04-23 – 2013-04-27 (×9): 1 via RESPIRATORY_TRACT
  Filled 2013-04-22: qty 12

## 2013-04-22 MED ORDER — TRYPSIN-CASTOR OIL-PERU BALSAM EX OINT: TOPICAL_OINTMENT | Freq: Three times a day (TID) | CUTANEOUS | Status: DC | PRN

## 2013-04-22 NOTE — Progress Notes (Signed)
SUBJECTIVE:  He has no acute chest pain or SOB.     PHYSICAL EXAM Filed Vitals:   04/22/13 0400 04/22/13 0405 04/22/13 0800 04/22/13 0932  BP:  116/54 126/59   Pulse:  72  64  Temp:  97.6 F (36.4 C) 98 F (36.7 C)   TempSrc:  Oral Oral   Resp: 20 18    Height:      Weight:  259 lb 14.8 oz (117.9 kg)    SpO2: 97% 98% 100%    General:  No distress Lungs:  Clear Heart:  RRR Abdomen:  Positive bowel sounds, no rebound no guarding Extremities:  Moderately severe edema.   LABS:  Results for orders placed during the hospital encounter of 04/14/13 (from the past 24 hour(s))  GLUCOSE, CAPILLARY     Status: Abnormal   Collection Time    04/21/13 12:05 PM      Result Value Range   Glucose-Capillary 148 (*) 70 - 99 mg/dL   Comment 1 Notify RN     Comment 2 Documented in Chart    GLUCOSE, CAPILLARY     Status: Abnormal   Collection Time    04/21/13  9:04 PM      Result Value Range   Glucose-Capillary 168 (*) 70 - 99 mg/dL  GLUCOSE, CAPILLARY     Status: Abnormal   Collection Time    04/21/13 11:11 PM      Result Value Range   Glucose-Capillary 173 (*) 70 - 99 mg/dL  CARBOXYHEMOGLOBIN     Status: Abnormal   Collection Time    04/22/13  4:05 AM      Result Value Range   Total hemoglobin 10.0 (*) 13.5 - 18.0 g/dL   O2 Saturation 28.4     Carboxyhemoglobin 2.3 (*) 0.5 - 1.5 %   Methemoglobin 1.1  0.0 - 1.5 %  GLUCOSE, CAPILLARY     Status: Abnormal   Collection Time    04/22/13  4:15 AM      Result Value Range   Glucose-Capillary 135 (*) 70 - 99 mg/dL  BASIC METABOLIC PANEL     Status: Abnormal   Collection Time    04/22/13  4:20 AM      Result Value Range   Sodium 130 (*) 135 - 145 mEq/L   Potassium 4.3  3.5 - 5.1 mEq/L   Chloride 85 (*) 96 - 112 mEq/L   CO2 40 (*) 19 - 32 mEq/L   Glucose, Bld 147 (*) 70 - 99 mg/dL   BUN 80 (*) 6 - 23 mg/dL   Creatinine, Ser 1.32 (*) 0.50 - 1.35 mg/dL   Calcium 9.8  8.4 - 44.0 mg/dL   GFR calc non Af Amer 49 (*) >90 mL/min   GFR calc Af Amer 57 (*) >90 mL/min    Intake/Output Summary (Last 24 hours) at 04/22/13 1037 Last data filed at 04/22/13 0800  Gross per 24 hour  Intake 1329.6 ml  Output   5350 ml  Net -4020.4 ml    ASSESSMENT AND PLAN:  ANASARCA:  Good urine output.  Weight down 5 lbs.   Continue current level of diuretic.    DIASTOLIC HF:  See above.   CKD:  Creat is confusing as it has been up and down.  I think the current reading is baseline.  Continue current therapy.   HYPONATREMIA/HPOKALEMIA:  Na improved.  Continue current therapy.   PANNICULITIS:  Completed antibiotics.   DISPOSITION:  SNF   Fayrene Fearing  Monia Timmers 04/22/2013 10:37 AM

## 2013-04-22 NOTE — Progress Notes (Signed)
Lab called critical Co2 40.  Md already aware of Co2 39 yesterday.  Vs stable. Will continue to monitor. [name]

## 2013-04-23 LAB — CARBOXYHEMOGLOBIN
Carboxyhemoglobin: 2.4 % — ABNORMAL HIGH (ref 0.5–1.5)
Methemoglobin: 1.3 % (ref 0.0–1.5)

## 2013-04-23 LAB — BASIC METABOLIC PANEL
BUN: 80 mg/dL — ABNORMAL HIGH (ref 6–23)
Creatinine, Ser: 1.36 mg/dL — ABNORMAL HIGH (ref 0.50–1.35)
GFR calc non Af Amer: 52 mL/min — ABNORMAL LOW (ref >90)
Glucose, Bld: 114 mg/dL — ABNORMAL HIGH (ref 70–99)
Potassium: 3.7 mEq/L (ref 3.5–5.1)

## 2013-04-23 LAB — GLUCOSE, CAPILLARY: Glucose-Capillary: 109 mg/dL — ABNORMAL HIGH (ref 70–99)

## 2013-04-23 NOTE — Progress Notes (Signed)
Advanced Heart Failure Rounding Note   Subjective:   [name] is a 67 yo male with PMX s/f CAD (s/p CABG x 4 in 2001, normal Myoview 2010), chronic diastolic CHF/right HF, persistent a-fib, DM, HTN, HL, carotid artery disease, COPD, hypothyroidism, EtOH abuse, chronic venous insufficiency, morbid obesity and medical non-compliance. He is not on anticoagulants due to ETOH abuse and fall risk  EF 55-60% moderate RV dysfunction. Admitted with marked fluid overload. Baseline weight about 249  Diamox started over the weekend for CO2, continues to increase 43. Weight down another 10 lbs, 24hr I/O -5.4 liters. Remains on lasix 30 mg/hr and dopamine at 3 mcg. Denies SOB, orthopnea or CP.   Co-ox 73% CVP 13  Objective:   Weight Range:  Vital Signs:   Temp:  [97.5 F (36.4 C)-98.6 F (37 C)] 97.5 F (36.4 C) (07/14 0741) Pulse Rate:  [62-66] 66 (07/14 0424) Resp:  [16-18] 18 (07/14 0741) BP: (116-158)/(46-83) 131/73 mmHg (07/14 0319) SpO2:  [96 %-100 %] 99 % (07/14 0440) Weight:  [249 lb 9 oz (113.2 kg)] 249 lb 9 oz (113.2 kg) (07/14 0424) Last BM Date: 04/20/13  Weight change: Filed Weights   04/21/13 0403 04/22/13 0405 04/23/13 0424  Weight: 264 lb 12.4 oz (120.1 kg) 259 lb 14.8 oz (117.9 kg) 249 lb 9 oz (113.2 kg)    Intake/Output:   Intake/Output Summary (Last 24 hours) at 04/23/13 0811 Last data filed at 04/23/13 0700  Gross per 24 hour  Intake 1315.12 ml  Output   6275 ml  Net -4959.88 ml    CVP 17 Physical Exam: General: Chronically ill appearing. No resp difficulty; lying in bed flat HEENT: normal  Neck: supple. JVP 10 cm; carotids 2+ bilaterally; no bruits. No lymphadenopathy or thryomegaly appreciated.  Cor: PMI normal. Irregular rate & rhythm. No rubs, gallops. 2/6 SEM RUSB  Lungs: CTA Abdomen: morbidly obese, soft, nontender, mild distention. No bruits or masses. Good bowel sounds. Diffuse macular rash with desquamation around pannus  Extremities: no cyanosis,  clubbing, rash, LLE and RLE + edema; UNNA boots intact Neuro: alert & orientedx3, cranial nerves grossly intact. Moves all 4 extremities w/o difficulty. Affect pleasant.    Telemetry: AF 70-80s  Labs: Basic Metabolic Panel:  Recent Labs Lab 04/19/13 0807 04/20/13 0435 04/21/13 0400 04/22/13 0420 04/23/13 0350  NA 131* 130* 126* 130* 133*  K 3.8 3.5 4.1 4.3 3.7  CL 85* 85* 82* 85* 83*  CO2 36* 37* 39* 40* 43*  GLUCOSE 206* 169* 273* 147* 114*  BUN 75* 73* 74* 80* 80*  CREATININE 1.34 0.39* 0.45* 1.41* 1.36*  CALCIUM 9.6 9.4 9.1 9.8 9.7    Liver Function Tests: No results found for this basename: AST, ALT, ALKPHOS, BILITOT, PROT, ALBUMIN,  in the last 168 hours No results found for this basename: LIPASE, AMYLASE,  in the last 168 hours No results found for this basename: AMMONIA,  in the last 168 hours  CBC:  Recent Labs Lab 04/16/13 0930  WBC 6.4  HGB 10.8*  HCT 35.6*  MCV 87.0  PLT 134*    Cardiac Enzymes: No results found for this basename: CKTOTAL, CKMB, CKMBINDEX, TROPONINI,  in the last 168 hours  BNP: BNP (last 3 results)  Recent Labs  08/30/12 0416 09/21/12 0555 04/14/13 1301  PROBNP 18070.0* 4036.0* 6847.0*     Other results:  Imaging: No results found.   Medications:     Scheduled Medications: . acetaZOLAMIDE  500 mg Oral BID  .  aspirin EC  325 mg Oral Daily  . carvedilol  9.375 mg Oral BID WC  . enoxaparin (LOVENOX) injection  50 mg Subcutaneous Q24H  . FLUoxetine  20 mg Oral Daily  . fluticasone  1 puff Inhalation BID  . folic acid  1 mg Oral Daily  . gabapentin  300 mg Oral Daily  . hydrALAZINE  25 mg Oral TID  . insulin aspart  0-20 Units Subcutaneous TID WC  . insulin glargine  10 Units Subcutaneous QHS  . lactulose  30 g Oral Daily  . levothyroxine  150 mcg Oral QAC breakfast  . metolazone  5 mg Oral BID  . multivitamin with minerals  1 tablet Oral Daily  . nystatin   Topical TID  . pantoprazole  40 mg Oral Daily  .  potassium chloride  40 mEq Oral BID  . senna  2 tablet Oral Daily  . simvastatin  40 mg Oral QHS  . sodium chloride  10-40 mL Intracatheter Q12H  . sodium chloride  3 mL Intravenous Q12H  . spironolactone  25 mg Oral Daily    Infusions: . DOPamine 3 mcg/kg/min (04/22/13 0357)  . furosemide (LASIX) infusion 30 mg/hr (04/23/13 0319)    PRN Medications: sodium chloride, acetaminophen, hydrocortisone cream, ondansetron (ZOFRAN) IV, oxyCODONE, sodium chloride, sodium chloride, traMADol, white petrolatum   Assessment:   1. A/C diastolic and right heart failure with anasarca  - EF 55-60% RV moderately dilated/HK with RV-RA gradient 56 mmHg  2. Right heart failure  3. A Fib, chronic - no anticoagulation due to ETOH and falls  4. Hypothyroidism  5. Morbid obesity  6. CAD s/p CABG  7. ETOH abuse  8. DM2  9. Medical noncompliance  10. Panniculitis  11. Acute Kidney Injury - probably cardiorenal syndrome  12. Hyponatremia/Hypokalemia   Plan/Discussion:    Diuresis much improved with diamox, will continue today as volume is still up (CVP 13) and continue to monitor CO2. Cr stable, will continue lasix 30 mg/hr and dopamine 3 mcg/kg/min. Will try to transition to PO demadex in the next day or two.  He was on Demadex 80 daily at home, will likely increase to 80 qam/40 qpm  He agrees to SNF. I have filled out FL-2. We have discussed [facility] so we can monitor more closely in HF program.  [name] 8:11 AM

## 2013-04-23 NOTE — Progress Notes (Signed)
Lab called critical Co2 43.  Md already aware of yesterday Co2 40.  Pt sitting up on side of bed. VS stable. Will continue to monitor. [name]

## 2013-04-23 NOTE — Progress Notes (Signed)
CARDIAC REHAB PHASE I   PRE:  Rate/Rhythm: 70 afib  BP:  Supine: 152/65  Sitting:   Standing:    SaO2: 95%3L  MODE:  Ambulation: 170 ft   POST:  Rate/Rhythm: 82 afib  BP:  Supine:   Sitting: 100/41, 102/52  Standing:    SaO2: 97%3L 1015-1100 Pt walked 170 ft on 3L with gait belt and rolling walker and asst x 1. Stopped frequently to rest. C/o right hip pain prior to walk and had received pain med. During walk he c/o left hip pain to foot. Staggering gait at times. To bed sitting after walk. BP lower after walk. He does not know if he was dizzy during walk or not. PT to see this pm.   [name] Nutting, RN BSN  04/23/2013 10:53 AM

## 2013-04-23 NOTE — Clinical Social Work Note (Signed)
Clinical Social Worker updated patient and left a voice message with daughter regarding [facility] making a bed offer. CSW will continue to follow.   Rozetta Nunnery MSW, [employer] [phone]

## 2013-04-23 NOTE — Progress Notes (Signed)
Physical Therapy Treatment Patient Details Name: [name] MRN: [id number] DOB: December 24, 1945 Today's Date: 04/23/2013 Time: 1191-4782 PT Time Calculation (min): 38 min  PT Assessment / Plan / Recommendation  PT Comments   Patient with decreased distance with ambulation due to c/o LE weakness and initially "woozy headed."  Feel pt can benefit from continued PT in SNF level care which was recommended by MD for medical issues.  PT will follow acutely.  Follow Up Recommendations  Supervision/Assistance - 24 hour;SNF     Does the patient have the potential to tolerate intense rehabilitation   N/A  Barriers to Discharge  None      Equipment Recommendations  Rolling walker with 5" wheels    Recommendations for Other Services  None  Frequency Min 3X/week   Progress towards PT Goals Progress towards PT goals: Progressing toward goals  Plan Discharge plan needs to be updated    Precautions / Restrictions Precautions Precautions: Fall   Pertinent Vitals/Pain 8/10 back, hips and knees with mobility (pt feels due to stiffness from bedrest)    Mobility  Bed Mobility Sit to Supine: 4: Min assist;HOB flat;With rail Details for Bed Mobility Assistance: pt pulled on PT and rail to lift trunk upright Transfers Sit to Stand: From bed;With upper extremity assist;4: Min assist Stand to Sit: 4: Min assist;To bed Details for Transfer Assistance: assist for lifting from bed (c/o LE weakness) Ambulation/Gait Ambulation/Gait Assistance: 4: Min assist;4: Min guard Ambulation Distance (Feet): 150 Feet Assistive device: Rolling walker Ambulation/Gait Assistance Details: increased distance from walker with flexed posture.  Standing rests x 2 Gait Pattern: Trunk flexed;Wide base of support;Shuffle;Decreased stride length      PT Goals (current goals can now be found in the care plan section)    Visit Information  Last PT Received On: 04/23/13    Subjective Data   I think I need to move to  help with this soreness   Cognition  Cognition Arousal/Alertness: Awake/alert Behavior During Therapy: WFL for tasks assessed/performed Overall Cognitive Status: Within Functional Limits for tasks assessed       End of Session PT - End of Session Equipment Utilized During Treatment: Gait belt;Oxygen Activity Tolerance: Patient limited by fatigue Patient left: in bed;with call bell/phone within reach Nurse Communication: Patient requests pain meds   GP     [facility] 04/23/2013, 4:58 PM Sheran Lawless, PT [phone] 04/23/2013

## 2013-04-24 LAB — CBC
HCT: 32.2 % — ABNORMAL LOW (ref 39.0–52.0)
Hemoglobin: 10 g/dL — ABNORMAL LOW (ref 13.0–17.0)
MCH: 26.6 pg (ref 26.0–34.0)
MCV: 85.6 fL (ref 78.0–100.0)
RBC: 3.76 MIL/uL — ABNORMAL LOW (ref 4.22–5.81)

## 2013-04-24 LAB — GLUCOSE, CAPILLARY
Glucose-Capillary: 135 mg/dL — ABNORMAL HIGH (ref 70–99)
Glucose-Capillary: 152 mg/dL — ABNORMAL HIGH (ref 70–99)

## 2013-04-24 LAB — BASIC METABOLIC PANEL
BUN: 87 mg/dL — ABNORMAL HIGH (ref 6–23)
Calcium: 9.9 mg/dL (ref 8.4–10.5)
GFR calc Af Amer: 54 mL/min — ABNORMAL LOW (ref >90)
GFR calc non Af Amer: 47 mL/min — ABNORMAL LOW (ref >90)
Glucose, Bld: 168 mg/dL — ABNORMAL HIGH (ref 70–99)

## 2013-04-24 LAB — MAGNESIUM: Magnesium: 1.8 mg/dL (ref 1.5–2.5)

## 2013-04-24 MED ORDER — POTASSIUM CHLORIDE CRYS ER 20 MEQ PO TBCR
40.0000 meq | EXTENDED_RELEASE_TABLET | Freq: Two times a day (BID) | ORAL | Status: DC
  Administered 2013-04-25 – 2013-04-27 (×5): 40 meq via ORAL
  Filled 2013-04-24 (×6): qty 2

## 2013-04-24 MED ORDER — POTASSIUM CHLORIDE CRYS ER 20 MEQ PO TBCR: 60.0000 meq | EXTENDED_RELEASE_TABLET | Freq: Two times a day (BID) | ORAL | Status: DC

## 2013-04-24 MED ORDER — POTASSIUM CHLORIDE CRYS ER 20 MEQ PO TBCR
40.0000 meq | EXTENDED_RELEASE_TABLET | Freq: Three times a day (TID) | ORAL | Status: AC
  Administered 2013-04-24 (×4): 40 meq via ORAL
  Filled 2013-04-24: qty 1
  Filled 2013-04-24: qty 2

## 2013-04-24 NOTE — Progress Notes (Signed)
Advanced Heart Failure Rounding Note   Subjective:   [name] is a 67 yo male with PMX s/f CAD (s/p CABG x 4 in 2001, normal Myoview 2010), chronic diastolic CHF/right HF, persistent a-fib, DM, HTN, HL, carotid artery disease, COPD, hypothyroidism, EtOH abuse, chronic venous insufficiency, morbid obesity and medical non-compliance. He is not on anticoagulants due to ETOH abuse and fall risk  EF 55-60% moderate RV dysfunction. Admitted with marked fluid overload. Baseline weight about 249  Weight down another 4 lbs (totaal 24 lbs), 24 hr I/O -3 liters. CO2 improved with Diamox now 39. Still on dopamine 3 mcg and lasix at 30 mg/hr. Bed offered at [facility] for SNF/rehab. Had some dizziness with ambulation yesterday. Denies SOB, orthopnea or CP. K+ 3.4  Co-ox 77% CVP 10  Objective:   Weight Range:  Vital Signs:   Temp:  [97.4 F (36.3 C)-97.9 F (36.6 C)] 97.6 F (36.4 C) (07/15 0747) Pulse Rate:  [59-67] 67 (07/15 0747) Resp:  [17-18] 17 (07/15 0401) BP: (86-119)/(45-72) 119/72 mmHg (07/15 0747) SpO2:  [91 %-100 %] 97 % (07/15 0747) Weight:  [111.449 kg (245 lb 11.2 oz)] 111.449 kg (245 lb 11.2 oz) (07/15 0500) Last BM Date: 04/22/13  Weight change: Filed Weights   04/22/13 0405 04/23/13 0424 04/24/13 0500  Weight: 117.9 kg (259 lb 14.8 oz) 113.2 kg (249 lb 9 oz) 111.449 kg (245 lb 11.2 oz)    Intake/Output:   Intake/Output Summary (Last 24 hours) at 04/24/13 0837 Last data filed at 04/24/13 0600  Gross per 24 hour  Intake 1869.6 ml  Output   4900 ml  Net -3030.4 ml    CVP 17 Physical Exam: General: Chronically ill appearing. No resp difficulty; lying in bed flat HEENT: normal  Neck: supple. JVP 10 cm; carotids 2+ bilaterally; no bruits. No lymphadenopathy or thryomegaly appreciated.  Cor: PMI normal. Irregular rate & rhythm. No rubs, gallops. 2/6 SEM RUSB  Lungs: CTA Abdomen: morbidly obese, soft, nontender, mild distention. No bruits or masses. Good bowel  sounds. Extremities: no cyanosis, clubbing, rash, LLE and RLE + edema; UNNA boots intact Neuro: alert & orientedx3, cranial nerves grossly intact. Moves all 4 extremities w/o difficulty. Affect pleasant.    Telemetry: AF 60-70s  Labs: Basic Metabolic Panel:  Recent Labs Lab 04/20/13 0435 04/21/13 0400 04/22/13 0420 04/23/13 0350 04/24/13 0400  NA 130* 126* 130* 133* 132*  K 3.5 4.1 4.3 3.7 3.4*  CL 85* 82* 85* 83* 82*  CO2 37* 39* 40* 43* 39*  GLUCOSE 169* 273* 147* 114* 168*  BUN 73* 74* 80* 80* 87*  CREATININE 0.39* 0.45* 1.41* 1.36* 1.48*  CALCIUM 9.4 9.1 9.8 9.7 9.9    Liver Function Tests: No results found for this basename: AST, ALT, ALKPHOS, BILITOT, PROT, ALBUMIN,  in the last 168 hours No results found for this basename: LIPASE, AMYLASE,  in the last 168 hours No results found for this basename: AMMONIA,  in the last 168 hours  CBC:  Recent Labs Lab 04/24/13 0400  WBC 6.6  HGB 10.0*  HCT 32.2*  MCV 85.6  PLT 151    Cardiac Enzymes: No results found for this basename: CKTOTAL, CKMB, CKMBINDEX, TROPONINI,  in the last 168 hours  BNP: BNP (last 3 results)  Recent Labs  08/30/12 0416 09/21/12 0555 04/14/13 1301  PROBNP 18070.0* 4036.0* 6847.0*     Other results:  Imaging: No results found.   Medications:     Scheduled Medications: . acetaZOLAMIDE  500  mg Oral BID  . aspirin EC  325 mg Oral Daily  . carvedilol  9.375 mg Oral BID WC  . enoxaparin (LOVENOX) injection  50 mg Subcutaneous Q24H  . FLUoxetine  20 mg Oral Daily  . fluticasone  1 puff Inhalation BID  . folic acid  1 mg Oral Daily  . gabapentin  300 mg Oral Daily  . hydrALAZINE  25 mg Oral TID  . insulin aspart  0-20 Units Subcutaneous TID WC  . insulin glargine  10 Units Subcutaneous QHS  . lactulose  30 g Oral Daily  . levothyroxine  150 mcg Oral QAC breakfast  . metolazone  5 mg Oral BID  . multivitamin with minerals  1 tablet Oral Daily  . nystatin   Topical TID  .  pantoprazole  40 mg Oral Daily  . potassium chloride  40 mEq Oral TID PC & HS  . [START ON 04/25/2013] potassium chloride  40 mEq Oral BID  . senna  2 tablet Oral Daily  . simvastatin  40 mg Oral QHS  . sodium chloride  10-40 mL Intracatheter Q12H  . sodium chloride  3 mL Intravenous Q12H  . spironolactone  25 mg Oral Daily    Infusions: . DOPamine 3 mcg/kg/min (04/23/13 2027)  . furosemide (LASIX) infusion 30 mg/hr (04/24/13 0428)    PRN Medications: sodium chloride, acetaminophen, hydrocortisone cream, ondansetron (ZOFRAN) IV, oxyCODONE, sodium chloride, sodium chloride, traMADol, white petrolatum   Assessment:   1. A/C diastolic and right heart failure with anasarca  - EF 55-60% RV moderately dilated/HK with RV-RA gradient 56 mmHg  2. Right heart failure  3. A Fib, chronic - no anticoagulation due to ETOH and falls  4. Hypothyroidism  5. Morbid obesity  6. CAD s/p CABG  7. ETOH abuse  8. DM2  9. Medical noncompliance  10. Panniculitis  11. Acute Kidney Injury - probably cardiorenal syndrome  12. Hyponatremia/Hypokalemia   Plan/Discussion:    Diuresis continues to improve with diamox, will continue for one more day as volume is still up (CVP 10) and urine still clear. Cr stable, will continue lasix 30 mg/hr and dopamine 3 mcg/kg/min. Will likely transition to PO demadex tomorrow. He was on Demadex 80 daily at home, will increase to 80 qam/40 qpm  K+ 3.4 will supplement.   He agrees to SNF. Bed offered at [facility] and FL-2 filled out.   [name] 8:37 AM

## 2013-04-24 NOTE — Progress Notes (Signed)
CARDIAC REHAB PHASE I   PRE:  Rate/Rhythm: 64 afib  BP:  Supine: 121/58  Sitting:   Standing:    SaO2: 93% 1.5L  MODE:  Ambulation: 170 ft   POST:  Rate/Rhythm: 75 afib  BP:  Supine: 142/60  Sitting:   Standing:    SaO2: 94%2L 1120-1145 Pt walked 170 ft on 2L with rolling walker and asst x2 with slow pace. Could not get pt to increase distance. C/o hip hurting during walk but felt better after returning to bed. Could not get pt to go to recliner. Did well on 2L.   [name], RN BSN  04/24/2013 11:43 AM

## 2013-04-24 NOTE — Progress Notes (Signed)
Chart review complete.  Patient is not eligible for [facility] Care Management services because his/her PCP is not a [facility] primary care provider or is not [facility] affiliated.  For any additional questions or new referrals please contact Tim Henderson BSN RN [facility] Liaison at [phone] °

## 2013-04-25 LAB — CARBOXYHEMOGLOBIN
Carboxyhemoglobin: 2.5 % — ABNORMAL HIGH (ref 0.5–1.5)
O2 Saturation: 68.4 %

## 2013-04-25 LAB — BASIC METABOLIC PANEL
Calcium: 9.9 mg/dL (ref 8.4–10.5)
GFR calc Af Amer: >90 mL/min (ref >90)
GFR calc non Af Amer: >90 mL/min (ref >90)
Potassium: 3.6 mEq/L (ref 3.5–5.1)
Sodium: 131 mEq/L — ABNORMAL LOW (ref 135–145)

## 2013-04-25 LAB — GLUCOSE, CAPILLARY: Glucose-Capillary: 167 mg/dL — ABNORMAL HIGH (ref 70–99)

## 2013-04-25 MED ORDER — TORSEMIDE 20 MG PO TABS
80.0000 mg | ORAL_TABLET | Freq: Every day | ORAL | Status: DC
  Administered 2013-04-25: 80 mg via ORAL
  Filled 2013-04-25 (×2): qty 4

## 2013-04-25 MED ORDER — TORSEMIDE 20 MG PO TABS
40.0000 mg | ORAL_TABLET | Freq: Every day | ORAL | Status: DC
  Administered 2013-04-25: 40 mg via ORAL
  Filled 2013-04-25 (×2): qty 2

## 2013-04-25 NOTE — Progress Notes (Signed)
Physical Therapy Treatment Patient Details Name: [name] MRN: [id number] DOB: May 25, 1946 Today's Date: 04/25/2013 Time:  -     PT Assessment / Plan / Recommendation  PT Comments   Pt with increased lethargy this date. Pt able to amb 100' however required increased effort this date. Due to medical condition and required attn in addition to increased assist for mobility pt remains appropriate for SNF upon d/c   Follow Up Recommendations  SNF     Does the patient have the potential to tolerate intense rehabilitation     Barriers to Discharge        Equipment Recommendations  Rolling walker with 5" wheels    Recommendations for Other Services    Frequency Min 3X/week   Progress towards PT Goals Progress towards PT goals: Progressing toward goals  Plan Discharge plan needs to be updated    Precautions / Restrictions Precautions Precautions: Fall Restrictions Weight Bearing Restrictions: No   Pertinent Vitals/Pain Denies pain    Mobility  Bed Mobility Bed Mobility: Not assessed Transfers Transfers: Sit to Stand;Stand to Sit Sit to Stand: 2: Max assist;From elevated surface;With armrests Stand to Sit: 3: Mod assist;With upper extremity assist;To chair/3-in-1 Details for Transfer Assistance: pt unable to control descent when sitting in chair. Pt required maxA and max directional v/c's to achieve full upright position Ambulation/Gait Ambulation/Gait Assistance: 4: Min assist;4: Min guard Ambulation Distance (Feet): 100 Feet Assistive device: Rolling walker Ambulation/Gait Assistance Details: freq standing rest breaks, increased effort compared to last week. short shuffled steps, + SOB Gait Pattern: Trunk flexed;Wide base of support;Shuffle;Decreased stride length Gait velocity: slow Stairs: No    Exercises     PT Diagnosis:    PT Problem List:   PT Treatment Interventions:     PT Goals (current goals can now be found in the care plan section)    Visit  Information  Last PT Received On: 04/25/13 Assistance Needed: +1 History of Present Illness: Pt admitted with fluid overload and AC diastolic HF.    Subjective Data      Cognition  Cognition Arousal/Alertness: Lethargic (had difficulty waking pt up upon arrival) Behavior During Therapy: WFL for tasks assessed/performed Overall Cognitive Status: Within Functional Limits for tasks assessed    Balance     End of Session PT - End of Session Equipment Utilized During Treatment: Gait belt;Oxygen Activity Tolerance: Patient limited by fatigue Patient left: in chair;with call bell/phone within reach Nurse Communication: Mobility status   GP     Marcene Brawn 04/25/2013, 5:00 PM  Lewis Shock, PT, DPT Pager #: [phone] Office #: [phone]

## 2013-04-25 NOTE — Progress Notes (Signed)
Advanced Heart Failure Rounding Note   Subjective:   [name] is a 67 yo male with PMX s/f CAD (s/p CABG x 4 in 2001, normal Myoview 2010), chronic diastolic CHF/right HF, persistent a-fib, DM, HTN, HL, carotid artery disease, COPD, hypothyroidism, EtOH abuse, chronic venous insufficiency, morbid obesity and medical non-compliance. He is not on anticoagulants due to ETOH abuse and fall risk  EF 55-60% moderate RV dysfunction. Admitted with marked fluid overload. Baseline weight about 249  Weight down 9 lbs to 245. 24 hour I/O -4.5 liters. Do not believe Cr is accurate. Working with PT and bed offered at [facility].  CVP 4 today.   Denies SOB, orthopnea or CP.    Co-ox 68%  Objective:   Weight Range:  Vital Signs:   Temp:  [97.5 F (36.4 C)-98 F (36.7 C)] 97.5 F (36.4 C) (07/15 2300) Pulse Rate:  [65] 65 (07/15 2015) Resp:  [17] 17 (07/15 2015) BP: (117-142)/(55-60) 127/57 mmHg (07/15 2015) SpO2:  [91 %-100 %] 91 % (07/16 0700) Last BM Date: 04/24/13  Weight change: Filed Weights   04/22/13 0405 04/23/13 0424 04/24/13 0500  Weight: 117.9 kg (259 lb 14.8 oz) 113.2 kg (249 lb 9 oz) 111.449 kg (245 lb 11.2 oz)    Intake/Output:   Intake/Output Summary (Last 24 hours) at 04/25/13 0804 Last data filed at 04/25/13 0700  Gross per 24 hour  Intake   1170 ml  Output   5750 ml  Net  -4580 ml     Physical Exam: General: Chronically ill appearing. No resp difficulty; sitting on side of bed HEENT: normal  Neck: supple. JVP not elevated; carotids 2+ bilaterally; no bruits. No lymphadenopathy or thryomegaly appreciated.  Cor: PMI normal. Irregular rate & rhythm. No rubs, gallops. 2/6 SEM RUSB  Lungs: CTA Abdomen: morbidly obese, soft, nontender, mild distention. No bruits or masses. Good bowel sounds. Extremities: no cyanosis, clubbing, rash, LLE and RLE + edema; UNNA boots intact Neuro: alert & orientedx3, cranial nerves grossly intact. Moves all 4 extremities w/o  difficulty. Affect pleasant.    Telemetry: AF 60-70s  Labs: Basic Metabolic Panel:  Recent Labs Lab 04/21/13 0400 04/22/13 0420 04/23/13 0350 04/24/13 0400 04/25/13 0500  NA 126* 130* 133* 132* 131*  K 4.1 4.3 3.7 3.4* 3.6  CL 82* 85* 83* 82* 83*  CO2 39* 40* 43* 39* 39*  GLUCOSE 273* 147* 114* 168* 193*  BUN 74* 80* 80* 87* 91*  CREATININE 0.45* 1.41* 1.36* 1.48* 0.61  CALCIUM 9.1 9.8 9.7 9.9 9.9  MG  --   --   --  1.8  --     Liver Function Tests: No results found for this basename: AST, ALT, ALKPHOS, BILITOT, PROT, ALBUMIN,  in the last 168 hours No results found for this basename: LIPASE, AMYLASE,  in the last 168 hours No results found for this basename: AMMONIA,  in the last 168 hours  CBC:  Recent Labs Lab 04/24/13 0400  WBC 6.6  HGB 10.0*  HCT 32.2*  MCV 85.6  PLT 151    Cardiac Enzymes: No results found for this basename: CKTOTAL, CKMB, CKMBINDEX, TROPONINI,  in the last 168 hours  BNP: BNP (last 3 results)  Recent Labs  08/30/12 0416 09/21/12 0555 04/14/13 1301  PROBNP 18070.0* 4036.0* 6847.0*     Other results:  Imaging: No results found.   Medications:     Scheduled Medications: . aspirin EC  325 mg Oral Daily  . carvedilol  9.375 mg  Oral BID WC  . enoxaparin (LOVENOX) injection  50 mg Subcutaneous Q24H  . FLUoxetine  20 mg Oral Daily  . fluticasone  1 puff Inhalation BID  . folic acid  1 mg Oral Daily  . gabapentin  300 mg Oral Daily  . hydrALAZINE  25 mg Oral TID  . insulin aspart  0-20 Units Subcutaneous TID WC  . insulin glargine  10 Units Subcutaneous QHS  . lactulose  30 g Oral Daily  . levothyroxine  150 mcg Oral QAC breakfast  . multivitamin with minerals  1 tablet Oral Daily  . nystatin   Topical TID  . pantoprazole  40 mg Oral Daily  . potassium chloride  40 mEq Oral BID  . senna  2 tablet Oral Daily  . simvastatin  40 mg Oral QHS  . sodium chloride  10-40 mL Intracatheter Q12H  . sodium chloride  3 mL  Intravenous Q12H  . spironolactone  25 mg Oral Daily  . torsemide  40 mg Oral q1800  . torsemide  80 mg Oral Daily    Infusions: . DOPamine 2.982 mcg/kg/min (04/25/13 0700)  . furosemide (LASIX) infusion 30 mg/hr (04/25/13 0700)    PRN Medications: sodium chloride, acetaminophen, hydrocortisone cream, ondansetron (ZOFRAN) IV, oxyCODONE, sodium chloride, sodium chloride, traMADol, white petrolatum   Assessment:   1. A/C diastolic and right heart failure with anasarca  - EF 55-60% RV moderately dilated/HK with RV-RA gradient 56 mmHg  2. Right heart failure  3. A Fib, chronic - no anticoagulation due to ETOH and falls  4. Hypothyroidism  5. Morbid obesity  6. CAD s/p CABG  7. ETOH abuse  8. DM2  9. Medical noncompliance  10. Panniculitis  11. Acute Kidney Injury - probably cardiorenal syndrome  12. Hyponatremia/Hypokalemia   Plan/Discussion:    Weight down 9 lbs (total of 45 kbs). CVP 4.  BUN rising.  Will stop diamox, lasix gtt, and metolazone. Will continue dopamine at 3 mcg today and wean off tomorrow. Will start oral demadex 80/40 today.   He agrees to SNF. Bed offered at [facility] and FL-2 filled out.   [name] 8:04 AM

## 2013-04-26 ENCOUNTER — Encounter (HOSPITAL_COMMUNITY): Payer: Self-pay | Admitting: *Deleted

## 2013-04-26 LAB — BASIC METABOLIC PANEL
BUN: 99 mg/dL — ABNORMAL HIGH (ref 6–23)
Chloride: 85 mEq/L — ABNORMAL LOW (ref 96–112)
GFR calc Af Amer: >90 mL/min (ref >90)
Glucose, Bld: 175 mg/dL — ABNORMAL HIGH (ref 70–99)
Potassium: 3.6 mEq/L (ref 3.5–5.1)
Sodium: 132 mEq/L — ABNORMAL LOW (ref 135–145)

## 2013-04-26 LAB — GLUCOSE, CAPILLARY
Glucose-Capillary: 156 mg/dL — ABNORMAL HIGH (ref 70–99)
Glucose-Capillary: 163 mg/dL — ABNORMAL HIGH (ref 70–99)
Glucose-Capillary: 147 mg/dL — ABNORMAL HIGH (ref 70–99)

## 2013-04-26 LAB — CARBOXYHEMOGLOBIN
Carboxyhemoglobin: 2.3 % — ABNORMAL HIGH (ref 0.5–1.5)
Methemoglobin: 1.5 % (ref 0.0–1.5)
O2 Saturation: 63.9 %
Total hemoglobin: 10.6 g/dL — ABNORMAL LOW (ref 13.5–18.0)

## 2013-04-26 NOTE — Clinical Social Work Note (Signed)
Clinical Social Worker is continuing to follow to facilitate discharge to [facility] when medically stable.   Rozetta Nunnery MSW, [employer] [phone]

## 2013-04-26 NOTE — Progress Notes (Signed)
Pt arrived from 2900 A&x3.  Denies pain/discomfort.  Introduced self and environment.  Call bell at reach.  Verbalized understanding.  Will continue to monitor  Amanda Pea, RN.

## 2013-04-26 NOTE — Progress Notes (Signed)
Advanced Heart Failure Rounding Note   Subjective:   [name] is a 67 yo male with PMX s/f CAD (s/p CABG x 4 in 2001, normal Myoview 2010), chronic diastolic CHF/right HF, persistent a-fib, DM, HTN, HL, carotid artery disease, COPD, hypothyroidism, EtOH abuse, chronic venous insufficiency, morbid obesity and medical non-compliance. He is not on anticoagulants due to ETOH abuse and fall risk  EF 55-60% moderate RV dysfunction. Admitted with marked fluid overload. Baseline weight about 249  Off lasix, diamox and metolazone yesterday. Still on dopamine 3 mcg. Weight down 1 lbs (Total 46 lbs) 24 hour I/O -3 liters. Cr stable, but BUN increasing today 99. Working with PT and CR.   Denies SOB, orthopnea or CP.    Co-ox 68 < 63  Objective:   Weight Range:  Vital Signs:   Temp:  [97.7 F (36.5 C)-98.4 F (36.9 C)] 98 F (36.7 C) (07/17 0727) Pulse Rate:  [58] 58 (07/17 0727) Resp:  [18-20] 20 (07/17 0727) BP: (106-124)/(55-76) 106/76 mmHg (07/17 0727) SpO2:  [93 %-99 %] 97 % (07/17 0749) Weight:  [105.8 kg (233 lb 4 oz)] 105.8 kg (233 lb 4 oz) (07/17 0500) Last BM Date: 04/24/13  Weight change: Filed Weights   04/24/13 0500 04/25/13 0800 04/26/13 0500  Weight: 111.449 kg (245 lb 11.2 oz) 106.3 kg (234 lb 5.6 oz) 105.8 kg (233 lb 4 oz)    Intake/Output:   Intake/Output Summary (Last 24 hours) at 04/26/13 0807 Last data filed at 04/26/13 0700  Gross per 24 hour  Intake  353.2 ml  Output   3325 ml  Net -2971.8 ml     Physical Exam: General: Chronically ill appearing. No resp difficulty; lying in bed HEENT: normal  Neck: supple. JVP not elevated; carotids 2+ bilaterally; no bruits. No lymphadenopathy or thryomegaly appreciated.  Cor: PMI normal. Irregular rate & rhythm. No rubs, gallops. 2/6 SEM RUSB  Lungs: CTA Abdomen: morbidly obese, soft, nontender, mild distention. No bruits or masses. Good bowel sounds. Extremities: no cyanosis, clubbing, rash, LLE and RLE 1+ edema;  UNNA boots intact Neuro: alert & orientedx3, cranial nerves grossly intact. Moves all 4 extremities w/o difficulty. Affect pleasant.    Telemetry: AF 60-70s  Labs: Basic Metabolic Panel:  Recent Labs Lab 04/22/13 0420 04/23/13 0350 04/24/13 0400 04/25/13 0500 04/26/13 0500  NA 130* 133* 132* 131* 132*  K 4.3 3.7 3.4* 3.6 3.6  CL 85* 83* 82* 83* 85*  CO2 40* 43* 39* 39* 39*  GLUCOSE 147* 114* 168* 193* 175*  BUN 80* 80* 87* 91* 99*  CREATININE 1.41* 1.36* 1.48* 0.61 0.85  CALCIUM 9.8 9.7 9.9 9.9 9.8  MG  --   --  1.8  --   --     Liver Function Tests: No results found for this basename: AST, ALT, ALKPHOS, BILITOT, PROT, ALBUMIN,  in the last 168 hours No results found for this basename: LIPASE, AMYLASE,  in the last 168 hours No results found for this basename: AMMONIA,  in the last 168 hours  CBC:  Recent Labs Lab 04/24/13 0400  WBC 6.6  HGB 10.0*  HCT 32.2*  MCV 85.6  PLT 151    Cardiac Enzymes: No results found for this basename: CKTOTAL, CKMB, CKMBINDEX, TROPONINI,  in the last 168 hours  BNP: BNP (last 3 results)  Recent Labs  08/30/12 0416 09/21/12 0555 04/14/13 1301  PROBNP 18070.0* 4036.0* 6847.0*     Other results:  Imaging: No results found.   Medications:  Scheduled Medications: . aspirin EC  325 mg Oral Daily  . carvedilol  9.375 mg Oral BID WC  . enoxaparin (LOVENOX) injection  50 mg Subcutaneous Q24H  . FLUoxetine  20 mg Oral Daily  . fluticasone  1 puff Inhalation BID  . folic acid  1 mg Oral Daily  . gabapentin  300 mg Oral Daily  . hydrALAZINE  25 mg Oral TID  . insulin aspart  0-20 Units Subcutaneous TID WC  . insulin glargine  10 Units Subcutaneous QHS  . lactulose  30 g Oral Daily  . levothyroxine  150 mcg Oral QAC breakfast  . multivitamin with minerals  1 tablet Oral Daily  . nystatin   Topical TID  . pantoprazole  40 mg Oral Daily  . potassium chloride  40 mEq Oral BID  . senna  2 tablet Oral Daily  .  simvastatin  40 mg Oral QHS  . sodium chloride  10-40 mL Intracatheter Q12H  . sodium chloride  3 mL Intravenous Q12H  . spironolactone  25 mg Oral Daily  . torsemide  40 mg Oral q1800  . torsemide  80 mg Oral Daily    Infusions: . DOPamine 3 mcg/kg/min (04/25/13 2000)  . furosemide (LASIX) infusion Stopped (04/25/13 0900)    PRN Medications: sodium chloride, acetaminophen, hydrocortisone cream, ondansetron (ZOFRAN) IV, oxyCODONE, sodium chloride, sodium chloride, traMADol, white petrolatum   Assessment:   1. A/C diastolic and right heart failure with anasarca  - EF 55-60% RV moderately dilated/HK with RV-RA gradient 56 mmHg  2. Right heart failure  3. A Fib, chronic - no anticoagulation due to ETOH and falls  4. Hypothyroidism  5. Morbid obesity  6. CAD s/p CABG  7. ETOH abuse  8. DM2  9. Medical noncompliance  10. Panniculitis  11. Acute Kidney Injury - probably cardiorenal syndrome  12. Hyponatremia/Hypokalemia   Plan/Discussion:    Weight down 1 lb (total of 46 lbs) and appears to be euvolemic. Will stop dopamine today. BUN up to 99, however creatinine not elevated.  BUN has been high throughout stay. Will hold diuretics today and restart demadex 80/40 tomorrow.   He agrees to SNF. Bed offered at [facility] and FL-2 filled out.   [name] 8:07 AM

## 2013-04-27 LAB — BASIC METABOLIC PANEL
BUN: 99 mg/dL — ABNORMAL HIGH (ref 6–23)
Calcium: 9.8 mg/dL (ref 8.4–10.5)
Creatinine, Ser: 1.71 mg/dL — ABNORMAL HIGH (ref 0.50–1.35)
GFR calc non Af Amer: 39 mL/min — ABNORMAL LOW (ref >90)
Glucose, Bld: 105 mg/dL — ABNORMAL HIGH (ref 70–99)
Potassium: 3.6 mEq/L (ref 3.5–5.1)

## 2013-04-27 MED ORDER — TORSEMIDE 20 MG PO TABS: ORAL_TABLET | ORAL | Status: DC

## 2013-04-27 MED ORDER — OXYCODONE HCL 5 MG PO TABS: 5.0000 mg | ORAL_TABLET | Freq: Four times a day (QID) | ORAL | Status: DC | PRN

## 2013-04-27 MED ORDER — POTASSIUM CHLORIDE CRYS ER 20 MEQ PO TBCR: 40.0000 meq | EXTENDED_RELEASE_TABLET | Freq: Two times a day (BID) | ORAL | Status: DC

## 2013-04-27 MED ORDER — TRAMADOL HCL 50 MG PO TABS: 100.0000 mg | ORAL_TABLET | Freq: Three times a day (TID) | ORAL | Status: DC | PRN

## 2013-04-27 MED ORDER — ACETAMINOPHEN 325 MG PO TABS: 650.0000 mg | ORAL_TABLET | ORAL | Status: DC | PRN

## 2013-04-27 MED ORDER — HYDRALAZINE HCL 25 MG PO TABS: 25.0000 mg | ORAL_TABLET | Freq: Three times a day (TID) | ORAL | Status: DC

## 2013-04-27 MED ORDER — OXYCODONE HCL 10 MG PO TABS: 10.0000 mg | ORAL_TABLET | Freq: Four times a day (QID) | ORAL | Status: DC | PRN

## 2013-04-27 NOTE — Progress Notes (Signed)
Foley d/c as instructed as pt for [facility] facility today.  Tolerated well.  Will continue to monitor.  Amanda Pea, Charity fundraiser.

## 2013-04-27 NOTE — Progress Notes (Signed)
[id number] Cardiac Rehab Completed CHF education with pt. Discussed signs and symptoms of CHF, daily weights, CHF zones, when to call MD and 911. Pt voices understanding. Melina Copa RN

## 2013-04-27 NOTE — Discharge Summary (Signed)
Advanced Heart Failure Team  Discharge Summary   Patient ID: [name] MRN: [id number], DOB/AGE: 11-30-1945 67 y.o. Admit date: 04/14/2013 D/C date:     04/27/2013   Primary Discharge Diagnoses: 1.  A/C diastolic and right heart failure with anasarca       - EF 55-60% RV moderately dilated/HK with RV-RA gradient 56 mmHg  2. Right Heart failure  Secondary Discharge Diagnoses:  1. A Fib, chronic - no anticoagulation due to ETOH and falls  2. Hypothyroidism  3. Morbid obesity  4. CAD s/p CABG  5. ETOH abuse  6. DM2  7. Medical noncompliance  8. Panniculitis  9. Acute Kidney Injury - probably cardiorenal syndrome  10. Hyponatremia/Hypokalemia   Hospital Course:  [name] is a 67 yo male with PMX s/f CAD (s/p CABG x 4 in 2001, normal Myoview 2010), chronic diastolic CHF/right HF, persistent a-fib, DM, HTN, HL, carotid artery disease, COPD, hypothyroidism, EtOH abuse, chronic venous insufficiency, morbid obesity and medical non-compliance. He is not on anticoagulants due to ETOH abuse and fall risk.  ECHO 08/2012 EF 55-60% moderate bilateral RA/LA enlargement. RV moderately dilated. RVSP 65-59mmHG. His dry weight last on record was 225 lbs.  He is followed closely in the HF clinic and presented to the ED on 7/5 with progressive volume overload over the past few weeks along with SOB, orthopnea, CP and syncope. His weight on admission was 279 lbs along with pertinent labs K+5.8, BUN/Cr 76, 1.47, and pro-BNP 6847. He was admitted for diuresis.  He was initially started on IV lasix at 20 mg/hr, along with metolazone 5 mg BID, however his UOP remained sluggish. His weight was decreasing slowly so milrinone was added at 0.25 to help with diuresis. His weight continued to trend down slowly so a PICC was placed to help guide therapy with co-oxs and CVP monitoring and he was switched from milrinone to dopamine at 3 mcg/hr. Hydralazine 25 mg TID was also added for afterload reduction.   UF was  discussed with the patient however he was not interested in this therapy and his diuresis remained sluggish with 30 mg/hr lasix, metolazone 5 mg BID, and dopamine at 3 mcg. His CO2 was trending up so a trial of Diamox was added. His UOP picked up and his weight began to trend down. His Cr remained stable, however his BUN began to rise so his diuretics were held with continued diuresis. Today his weight is 230 lbs down a total of 49 lbs. His Cr jumped to 1.7 and BUN 99 so we will restart torsemide 80/40 on Sunday.  While he was here he also had a wound consult for panniculitis and was treated with Ancef and fluconazole. He worked with PT and CR and it was felt that he could benefit from SNF with rehab and the patient and family agreed to [facility]. He will continue to be followed closely in the HF clinic.   Today he deneis SOB, orthopnea or CP.   Discharge Weight Range: 230-235 Discharge Vitals: Blood pressure 104/56, pulse 62, temperature 97.2 F (36.2 C), temperature source Oral, resp. rate 18, height 5' 9.5" (1.765 m), weight 230 lb (104.327 kg), SpO2 97.00%.  Labs: Lab Results  Component Value Date   WBC 6.6 04/24/2013   HGB 10.0* 04/24/2013   HCT 32.2* 04/24/2013   MCV 85.6 04/24/2013   PLT 151 04/24/2013     Recent Labs Lab 04/27/13 0505  NA 134*  K 3.6  CL 88*  CO2  39*  BUN 99*  CREATININE 1.71*  CALCIUM 9.8  GLUCOSE 105*   Lab Results  Component Value Date   CHOL  Value: 144        ATP III CLASSIFICATION:  <200     mg/dL   Desirable  981-191  mg/dL   Borderline High  >=478    mg/dL   High        2/95/6213   HDL 24* 06/26/2010   LDLCALC  Value: 90        Total Cholesterol/HDL:CHD Risk Coronary Heart Disease Risk Table                     Men   Women  1/2 Average Risk   3.4   3.3  Average Risk       5.0   4.4  2 X Average Risk   9.6   7.1  3 X Average Risk  23.4   11.0        Use the calculated Patient Ratio above and the CHD Risk Table to determine the patient's CHD Risk.         ATP III CLASSIFICATION (LDL):  <100     mg/dL   Optimal  086-578  mg/dL   Near or Above                    Optimal  130-159  mg/dL   Borderline  469-629  mg/dL   High  >528     mg/dL   Very High 01/22/2439   TRIG 150* 06/26/2010   BNP (last 3 results)  Recent Labs  08/30/12 0416 09/21/12 0555 04/14/13 1301  PROBNP 18070.0* 4036.0* 6847.0*    Diagnostic Studies/Procedures   No results found.  Discharge Medications     Medication List         acetaminophen 325 MG tablet  Commonly known as:  TYLENOL  Take 2 tablets (650 mg total) by mouth every 4 (four) hours as needed.     aspirin EC 325 MG tablet  Take 325 mg by mouth daily.     carvedilol 3.125 MG tablet  Commonly known as:  COREG  Take 1 tablet (3.125 mg total) by mouth 2 (two) times daily with a meal. Take with carvedilol 6.25mg  for a total of 9.375mg      carvedilol 6.25 MG tablet  Commonly known as:  COREG  Take 6.25 mg by mouth 2 (two) times daily with a meal.     FLUoxetine 20 MG capsule  Commonly known as:  PROZAC  Take 20 mg by mouth daily.     fluticasone 220 MCG/ACT inhaler  Commonly known as:  FLOVENT HFA  Inhale 1 puff into the lungs 2 (two) times daily as needed (for shortness of breath).     folic acid 1 MG tablet  Commonly known as:  FOLVITE  Take 1 tablet (1 mg total) by mouth daily.     gabapentin 300 MG capsule  Commonly known as:  NEURONTIN  Take 1 capsule (300 mg total) by mouth daily.     hydrALAZINE 25 MG tablet  Commonly known as:  APRESOLINE  Take 1 tablet (25 mg total) by mouth 3 (three) times daily.     insulin glargine 100 UNIT/ML injection  Commonly known as:  LANTUS  Inject 10 Units into the skin at bedtime.     lactulose 10 GM/15ML solution  Commonly known as:  CHRONULAC  Take 45 mLs (30 g  total) by mouth daily.     levothyroxine 150 MCG tablet  Commonly known as:  SYNTHROID, LEVOTHROID  Take 150 mcg by mouth daily.     multivitamin capsule  Take 1 capsule by mouth  daily.     omeprazole 20 MG capsule  Commonly known as:  PRILOSEC  Take 20 mg by mouth daily.     Oxycodone HCl 10 MG Tabs  Take 1 tablet (10 mg total) by mouth every 6 (six) hours as needed.     potassium chloride SA 20 MEQ tablet  Commonly known as:  K-DUR,KLOR-CON  Take 2 tablets (40 mEq total) by mouth 2 (two) times daily.     senna 8.6 MG tablet  Commonly known as:  SENOKOT  Take 2 tablets by mouth daily as needed for constipation.     simvastatin 40 MG tablet  Commonly known as:  ZOCOR  Take 40 mg by mouth at bedtime.     spironolactone 25 MG tablet  Commonly known as:  ALDACTONE  Take 1 tablet (25 mg total) by mouth daily.     torsemide 20 MG tablet  Commonly known as:  DEMADEX  Take 80 mg (4 tablets in the am) and 40 mg (2 tablets in the pm). Please start on Sunday 7/20.     traMADol 50 MG tablet  Commonly known as:  ULTRAM  Take 2 tablets (100 mg total) by mouth every 8 (eight) hours as needed.        Disposition   The patient will be discharged in stable condition to home. Discharge Orders   Future Appointments Provider Department Dept Phone   05/02/2013 2:00 PM [facility] [phone]   Future Orders Complete By Expires     ACE Inhibitor / ARB already ordered  As directed     Diet - low sodium heart healthy  As directed     Heart Failure patients record your daily weight using the same scale at the same time of day  As directed     Heart failure skilled nursing facility orders  As directed     Comments:      Heart Failure Follow-up Care: Verify follow-up appointments per Patient Discharge Instructions. Arrange transportation. Reconcile home medications with discharge medication list.  Assessments:  Vital signs and oxygen saturation daily and may decrease frequency as patient condition stabilizes. If being transitioned from SNF facility to home, assess home environment for safety concerns,  caregiver support and availability of low-sodium foods. Consult Estate manager/land agent based on assessments. Assess for increased dyspnea, orthopnea, chest discomfort or chest pain, presence of rales/crackles, perripheral edema, abdominal distention, and/or weight gain of 3 pounds in one day or 5 pounds in one week. Daily Weights and Symptom Monitoring: Weigh daily before breakfast and after voiding using same scale and record weights on HF Zone Tool/Weight Chart to track weights with symptoms. Standing scale preferred. Call Physician/AHF Clinic for weight gain of 3 pounds or more in 24 hours or 5 pounds in one week, OR worsening symptoms as noted above. Activity:  Develop individualized activity plan with patient/caregiver. Patient Education: Teach patient/caregiver to weigh daily and record on weight chart. Reinforce the 5 Key Messages in the "Living with Heart Failure" book. Reinforce use of HF Zone Tool to support early symptom recognition and appropriate action. Reinforce low salt diet. Reinforce fluid restriction as ordered.  Please call the clinic if weights going up. Need PT to work  with on ambulation.    Questions:      Heart Failure Follow-up Care:  Advanced Heart Failure (AHF) Clinic at [phone]    Obtain the following labs:  Basic Metabolic Panel    Lab frequency:  Weekly    Fax lab results to:  AHF Clinic at [phone]    Daily Weights and Symptom Monitoring:  Fax weight chart weekly X 2 to AHF Clinic at [phone]    Diet:  No Added Salt    PT/OT Consult:  Yes    Activity:  As tolerated    Fluid restrictions:  2000 mL Fluid    Increase activity slowly  As directed     STOP any activity that causes chest pain, shortness of breath, dizziness, sweating, or exessive weakness  As directed       Follow-up Information   Follow up with Arvilla Meres, MD On 05/02/2013. (@ 2:00 pm; Gate code 1000)    Contact information:   [address] [place]  [address] [phone]         Duration of Discharge Encounter: Greater than 35 minutes   Signed, Aundria Rud  04/27/2013, 10:44 AM

## 2013-04-27 NOTE — Progress Notes (Signed)
Noted pt wrap BLE with una boots,  Dunn, RN wound nurse notified that pt has order to be seen.  Instructed she will be in to see pt.  Pt made aware.  Amanda Pea, Charity fundraiser.

## 2013-04-27 NOTE — Progress Notes (Signed)
Lupita Leash SW notified that pt's picc line need to be d/c'd before he is d/c to [facility].  Amanda Pea, Charity fundraiser.

## 2013-04-27 NOTE — Progress Notes (Signed)
Pt with picc line and to be d/c'd today.  Kennon Rounds, RN working at [name] informed &  instructed she will put in the order.  Amanda Pea, Charity fundraiser.

## 2013-04-27 NOTE — Consult Note (Signed)
WOC consult Note Reason for Consult: Requested to remove Una boots and assess BLE.  Pt states he has been wearing Una boots for a week. No open wounds or drainage when wraps removed.  Previous edema appears to have improved.  Ortho tech paged to apply BLE Una boots and coban and change Q Friday. Please re-consult if further assistance is needed.  Thank-you,  Cammie Mcgee MSN, RN, CWOCN, [place], CNS [phone]

## 2013-04-27 NOTE — Progress Notes (Signed)
Attempted to call report to [facility] and spoke with Alona Bene, pt's nurse instructed  had gotten information from family,  Amanda Pea, Charity fundraiser.

## 2013-04-27 NOTE — Progress Notes (Signed)
Orthopedic Tech Progress Note Patient Details:  [name] 04/10/1946 [id number] Bilateral unna boots applied to LE's. Application tolerated well.  Ortho Devices Type of Ortho Device: Radio broadcast assistant Ortho Device/Splint Location: Bilateral LE Ortho Device/Splint Interventions: Application   [name] 04/27/2013, 1:11 PM

## 2013-04-27 NOTE — Progress Notes (Signed)
Advanced Heart Failure Rounding Note   Subjective:   [name] is a 67 yo male with PMX s/f CAD (s/p CABG x 4 in 2001, normal Myoview 2010), chronic diastolic CHF/right HF, persistent a-fib, DM, HTN, HL, carotid artery disease, COPD, hypothyroidism, EtOH abuse, chronic venous insufficiency, morbid obesity and medical non-compliance. He is not on anticoagulants due to ETOH abuse and fall risk  EF 55-60% moderate RV dysfunction. Admitted with marked fluid overload. Baseline weight about 249  Off dopamine yesterday and transferred to telemetry. Diuretics held yesterday. Weight down another 3 lbs 24 hr I/O -2.3 liters (total 49 lbs). Cr up from .85 to 1.71. Denies SOB, orthopnea or CP.      Objective:   Weight Range:  Vital Signs:   Temp:  [97.1 F (36.2 C)-98 F (36.7 C)] 97.2 F (36.2 C) (07/18 0530) Pulse Rate:  [50-63] 62 (07/18 0859) Resp:  [18-20] 18 (07/18 0530) BP: (92-129)/(45-64) 104/56 mmHg (07/18 0859) SpO2:  [94 %-100 %] 97 % (07/18 0756) Weight:  [104.327 kg (230 lb)-106 kg (233 lb 11 oz)] 104.327 kg (230 lb) (07/18 0530) Last BM Date: 04/26/13  Weight change: Filed Weights   04/26/13 0500 04/26/13 1118 04/27/13 0530  Weight: 105.8 kg (233 lb 4 oz) 106 kg (233 lb 11 oz) 104.327 kg (230 lb)    Intake/Output:   Intake/Output Summary (Last 24 hours) at 04/27/13 1015 Last data filed at 04/27/13 0844  Gross per 24 hour  Intake    460 ml  Output   3075 ml  Net  -2615 ml     Physical Exam: General: Chronically ill appearing. No resp difficulty; sitting on side of bed HEENT: normal  Neck: supple. JVP not elevated; carotids 2+ bilaterally; no bruits. No lymphadenopathy or thryomegaly appreciated.  Cor: PMI normal. Irregular rate & rhythm. No rubs, gallops. 2/6 SEM RUSB  Lungs: CTA Abdomen: morbidly obese, soft, nontender, mild distention. No bruits or masses. Good bowel sounds. Extremities: no cyanosis, clubbing, rash, no edema; UNNA boots intact Neuro: alert &  orientedx3, cranial nerves grossly intact. Moves all 4 extremities w/o difficulty. Affect pleasant.    Telemetry: AF 60-70s  Labs: Basic Metabolic Panel:  Recent Labs Lab 04/23/13 0350 04/24/13 0400 04/25/13 0500 04/26/13 0500 04/27/13 0505  NA 133* 132* 131* 132* 134*  K 3.7 3.4* 3.6 3.6 3.6  CL 83* 82* 83* 85* 88*  CO2 43* 39* 39* 39* 39*  GLUCOSE 114* 168* 193* 175* 105*  BUN 80* 87* 91* 99* 99*  CREATININE 1.36* 1.48* 0.61 0.85 1.71*  CALCIUM 9.7 9.9 9.9 9.8 9.8  MG  --  1.8  --   --   --     Liver Function Tests: No results found for this basename: AST, ALT, ALKPHOS, BILITOT, PROT, ALBUMIN,  in the last 168 hours No results found for this basename: LIPASE, AMYLASE,  in the last 168 hours No results found for this basename: AMMONIA,  in the last 168 hours  CBC:  Recent Labs Lab 04/24/13 0400  WBC 6.6  HGB 10.0*  HCT 32.2*  MCV 85.6  PLT 151    Cardiac Enzymes: No results found for this basename: CKTOTAL, CKMB, CKMBINDEX, TROPONINI,  in the last 168 hours  BNP: BNP (last 3 results)  Recent Labs  08/30/12 0416 09/21/12 0555 04/14/13 1301  PROBNP 18070.0* 4036.0* 6847.0*     Other results:  Imaging: No results found.   Medications:     Scheduled Medications: . aspirin EC  325  mg Oral Daily  . carvedilol  9.375 mg Oral BID WC  . enoxaparin (LOVENOX) injection  50 mg Subcutaneous Q24H  . FLUoxetine  20 mg Oral Daily  . fluticasone  1 puff Inhalation BID  . folic acid  1 mg Oral Daily  . gabapentin  300 mg Oral Daily  . hydrALAZINE  25 mg Oral TID  . insulin aspart  0-20 Units Subcutaneous TID WC  . insulin glargine  10 Units Subcutaneous QHS  . lactulose  30 g Oral Daily  . levothyroxine  150 mcg Oral QAC breakfast  . multivitamin with minerals  1 tablet Oral Daily  . nystatin   Topical TID  . pantoprazole  40 mg Oral Daily  . potassium chloride  40 mEq Oral BID  . senna  2 tablet Oral Daily  . simvastatin  40 mg Oral QHS  . sodium  chloride  10-40 mL Intracatheter Q12H  . sodium chloride  3 mL Intravenous Q12H  . spironolactone  25 mg Oral Daily    Infusions:    PRN Medications: sodium chloride, acetaminophen, hydrocortisone cream, ondansetron (ZOFRAN) IV, oxyCODONE, sodium chloride, sodium chloride, traMADol, white petrolatum   Assessment:   1. A/C diastolic and right heart failure with anasarca  - EF 55-60% RV moderately dilated/HK with RV-RA gradient 56 mmHg  2. Right heart failure  3. A Fib, chronic - no anticoagulation due to ETOH and falls  4. Hypothyroidism  5. Morbid obesity  6. CAD s/p CABG  7. ETOH abuse  8. DM2  9. Medical noncompliance  10. Panniculitis  11. Acute Kidney Injury - probably cardiorenal syndrome  12. Hyponatremia/Hypokalemia   Plan/Discussion:    Weight down 3 lb (total of 49 lbs) on no diuretics. Cr up to 1.7 from 0.8, will hold diuretics again today. Restart torsemide Sunday 80/40.  Stable to go to [facility] will work on discharge summary and orders.  He agrees to SNF. Bed offered at [facility] and FL-2 filled out.   Whalen Trompeter 10:15 AM

## 2013-04-27 NOTE — Progress Notes (Signed)
PT Cancellation Note  Patient Details Name: [name] MRN: [id number] DOB: Mar 08, 1946   Cancelled Treatment:    Reason Eval/Treat Not Completed: Other (comment) (Pt sound a sleep and ambulance called to transport pt to SNF).  Pt ready to d/c and will follow up with SNF for PT.    Ji Fairburn 04/27/2013, 4:21 PM  Jake Shark, PT DPT [phone]

## 2013-04-30 LAB — GLUCOSE, CAPILLARY: Glucose-Capillary: 204 mg/dL — ABNORMAL HIGH (ref 70–99)

## 2013-05-01 ENCOUNTER — Other Ambulatory Visit: Payer: Self-pay | Admitting: Geriatric Medicine

## 2013-05-01 MED ORDER — TRAMADOL HCL 50 MG PO TABS: 100.0000 mg | ORAL_TABLET | Freq: Three times a day (TID) | ORAL | Status: DC | PRN

## 2013-05-02 ENCOUNTER — Ambulatory Visit (HOSPITAL_COMMUNITY)
Admission: RE | Admit: 2013-05-02 | Discharge: 2013-05-02 | Disposition: A | Discharge status: Home / Self Care | Payer: Medicare Other | Source: Ambulatory Visit | Attending: Cardiology | Admitting: Cardiology

## 2013-05-02 ENCOUNTER — Encounter (HOSPITAL_COMMUNITY): Payer: Self-pay

## 2013-05-02 ENCOUNTER — Non-Acute Institutional Stay (SKILLED_NURSING_FACILITY): Payer: Medicare Other | Admitting: Internal Medicine

## 2013-05-02 VITALS — BP 110/60 | HR 58 | Wt 229.4 lb

## 2013-05-02 DIAGNOSIS — IMO0002 Reserved for concepts with insufficient information to code with codable children: Secondary | ICD-10-CM

## 2013-05-02 DIAGNOSIS — R5381 Other malaise: Secondary | ICD-10-CM

## 2013-05-02 DIAGNOSIS — I798 Other disorders of arteries, arterioles and capillaries in diseases classified elsewhere: Secondary | ICD-10-CM

## 2013-05-02 DIAGNOSIS — E1159 Type 2 diabetes mellitus with other circulatory complications: Secondary | ICD-10-CM

## 2013-05-02 DIAGNOSIS — I4891 Unspecified atrial fibrillation: Secondary | ICD-10-CM

## 2013-05-02 DIAGNOSIS — E039 Hypothyroidism, unspecified: Secondary | ICD-10-CM

## 2013-05-02 DIAGNOSIS — E119 Type 2 diabetes mellitus without complications: Secondary | ICD-10-CM | POA: Insufficient documentation

## 2013-05-02 DIAGNOSIS — M793 Panniculitis, unspecified: Secondary | ICD-10-CM

## 2013-05-02 DIAGNOSIS — R5383 Other fatigue: Secondary | ICD-10-CM

## 2013-05-02 DIAGNOSIS — I10 Essential (primary) hypertension: Secondary | ICD-10-CM | POA: Insufficient documentation

## 2013-05-02 DIAGNOSIS — I5033 Acute on chronic diastolic (congestive) heart failure: Secondary | ICD-10-CM

## 2013-05-02 DIAGNOSIS — I6529 Occlusion and stenosis of unspecified carotid artery: Secondary | ICD-10-CM | POA: Insufficient documentation

## 2013-05-02 DIAGNOSIS — N179 Acute kidney failure, unspecified: Secondary | ICD-10-CM

## 2013-05-02 DIAGNOSIS — E1165 Type 2 diabetes mellitus with hyperglycemia: Secondary | ICD-10-CM

## 2013-05-02 DIAGNOSIS — I5032 Chronic diastolic (congestive) heart failure: Secondary | ICD-10-CM | POA: Insufficient documentation

## 2013-05-02 DIAGNOSIS — R609 Edema, unspecified: Secondary | ICD-10-CM

## 2013-05-02 DIAGNOSIS — E1151 Type 2 diabetes mellitus with diabetic peripheral angiopathy without gangrene: Secondary | ICD-10-CM

## 2013-05-02 DIAGNOSIS — F1010 Alcohol abuse, uncomplicated: Secondary | ICD-10-CM | POA: Insufficient documentation

## 2013-05-02 DIAGNOSIS — R531 Weakness: Secondary | ICD-10-CM

## 2013-05-02 DIAGNOSIS — I2510 Atherosclerotic heart disease of native coronary artery without angina pectoris: Secondary | ICD-10-CM

## 2013-05-02 DIAGNOSIS — R601 Generalized edema: Secondary | ICD-10-CM

## 2013-05-02 NOTE — Progress Notes (Signed)
Patient ID: [name], male   DOB: October 21, 1945, 67 y.o.   MRN: [id number] Provider:  Gwenith Spitz. Renato Gails, D.O., C.M.D. Location:  [facility]  PCP: [name], MD  Code Status: full code   Allergies  Allergen Reactions  . Erythromycin Hives    Chief Complaint: new admission for short term rehab s/p hospitalization for CHF, panniculitis HPI: 67 y.o. male with h/o COPD, aspiration pneumonia, alcohol abuse, CAD s/p CABG, DMII, was admitted here for short term rehab s/p hospitalization with acute on chronic CHF and panniculitis.  He is completing abx with ancef and fluconazole.  His dry weight is 225#, was up to 279# at hospital admission.  Now 233.4 here at admission.  He required lasix drip, milrinone, and dopamine.  His urine output there was diminished and diamox was added.  D/c weight at hospital was 230#.  He was restarted on torsemide 80mg  in am and 40mg  in pm.  His glucose levels here have been 191, 255, 247, 246.     Review of Systems:  Review of Systems  Constitutional: Positive for malaise/fatigue. Negative for fever.  Respiratory: Positive for shortness of breath.   Cardiovascular: Positive for leg swelling. Negative for chest pain and palpitations.  Gastrointestinal: Negative for constipation.  Genitourinary: Positive for frequency. Negative for dysuria.  Musculoskeletal: Negative for myalgias.  Skin: Positive for rash.  Neurological: Positive for sensory change. Negative for headaches.  Endo/Heme/Allergies: Bruises/bleeds easily.  Psychiatric/Behavioral: Negative for memory loss.     Past Medical History  Diagnosis Date  . CHRONIC OBSTRUCTIVE PULMONARY DISEASE 06/20/2009  . OBSTRUCTIVE SLEEP APNEA 06/20/2009  . CAROTID STENOSIS 06/20/2009    A. 08/2001 s/p L CEA;  B.   09/14/11 - Carotid U/S - 40-59% bilateral stenosis, left CEA patch angioplasty is patent  . DM 06/20/2009  . CAD 06/20/2009    A.  08/2000 - s/p CABG x 4 - LIMA-LAD, Left Radial-OM, VG-DIAG,  VG-RCA;  B. Neg. MV  2010  . HYPERLIPIDEMIA 06/20/2009  . HYPERTENSION 06/20/2009  . Hypothyroidism   . Low back pain   . Asthma     as child  . Pneumonia   . Atrial fibrillation     Not felt to be coumadin candidate 2/2 ETOH use.  Marland Kitchen ETOH abuse   . History of tobacco abuse     remote - quit 1970  . Bilateral renal cysts   . Marijuana abuse   . Morbidly obese   . CHF (congestive heart failure)   . Falls frequently    Past Surgical History  Procedure Laterality Date  . Carotid endarterectomy  2002    left  . Coronary artery bypass graft      x 4 - 2001   Social History:   reports that he quit smoking about 45 years ago. He does not have any smokeless tobacco history on file. He reports that he drinks alcohol. He reports that he uses illicit drugs (Marijuana).  Family History  Problem Relation Age of Onset  . Stroke Mother     ?  . Stroke Father     ?    Medications: Patient's Medications  New Prescriptions   METOLAZONE (ZAROXOLYN) 2.5 MG TABLET    Take 1 tablet (2.5 mg total) by mouth once a week.  Previous Medications   ASPIRIN EC 325 MG TABLET    Take 325 mg by mouth daily.   CARVEDILOL (COREG) 6.25 MG TABLET    Take 6.25 mg by mouth 2 (  two) times daily with a meal. Take along with 3.125 mg tabs to equal 9.375 mg   COLCHICINE 0.6 MG TABLET    Take 0.6 mg by mouth daily.   FLUOXETINE (PROZAC) 20 MG CAPSULE    Take 20 mg by mouth daily.     FLUTICASONE (FLOVENT HFA) 220 MCG/ACT INHALER    Inhale 1 puff into the lungs 2 (two) times daily as needed (for shortness of breath).    FOLIC ACID (FOLVITE) 1 MG TABLET    Take 1 tablet (1 mg total) by mouth daily.   GABAPENTIN (NEURONTIN) 300 MG CAPSULE    Take 1 capsule (300 mg total) by mouth daily.   HYDRALAZINE (APRESOLINE) 25 MG TABLET    Take 1 tablet (25 mg total) by mouth 3 (three) times daily.   INSULIN GLARGINE (LANTUS) 100 UNIT/ML INJECTION    Inject 10 Units into the skin at bedtime.   LACTULOSE (CHRONULAC) 10 GM/15ML  SOLUTION    Take 45 mLs (30 g total) by mouth daily.   LEVOTHYROXINE (SYNTHROID, LEVOTHROID) 150 MCG TABLET    Take 150 mcg by mouth daily.    MULTIPLE VITAMIN (MULTIVITAMIN) CAPSULE    Take 1 capsule by mouth daily.   OMEPRAZOLE (PRILOSEC) 20 MG CAPSULE    Take 20 mg by mouth daily.     SENNA (SENOKOT) 8.6 MG TABLET    Take 2 tablets by mouth daily as needed for constipation.    SIMVASTATIN (ZOCOR) 40 MG TABLET    Take 40 mg by mouth at bedtime.    Modified Medications   Modified Medication Previous Medication   CARVEDILOL (COREG) 3.125 MG TABLET carvedilol (COREG) 3.125 MG tablet      Take 1 tablet (3.125 mg total) by mouth 2 (two) times daily with a meal. To be taken with 6.25mg  bid TO EQUAL 9.375MG     TAKE 1 TABLET BY MOUTH TWICE DAILY WITH MEAL. TAKE WITH 6.25MG  TO TOTAL 9.375MG .   OXYCODONE HCL 10 MG TABS oxyCODONE 10 MG TABS      Take 1 tablet (10 mg total) by mouth every 6 (six) hours as needed.    Take 1 tablet (10 mg total) by mouth every 6 (six) hours as needed.   SPIRONOLACTONE (ALDACTONE) 25 MG TABLET spironolactone (ALDACTONE) 25 MG tablet      Take 1 tablet (25 mg total) by mouth daily.    Take 1 tablet (25 mg total) by mouth daily.   TORSEMIDE (DEMADEX) 20 MG TABLET torsemide (DEMADEX) 20 MG tablet      Take 80 mg (4 tablets) in the am and 80 mg (4 tabs) in PM    Take 80 mg (4 tablets) in the am and 40 mg (2 tabs) in PM  Discontinued Medications   ACETAMINOPHEN (TYLENOL) 325 MG TABLET    Take 2 tablets (650 mg total) by mouth every 4 (four) hours as needed.   CARVEDILOL (COREG) 3.125 MG TABLET    Take 1 tablet (3.125 mg total) by mouth 2 (two) times daily with a meal. Take with carvedilol 6.25mg  for a total of 9.375mg    CARVEDILOL (COREG) 6.25 MG TABLET    Take 6.25 mg by mouth 2 (two) times daily with a meal.   POTASSIUM CHLORIDE SA (K-DUR,KLOR-CON) 20 MEQ TABLET    Take 2 tablets (40 mEq total) by mouth 2 (two) times daily.   TORSEMIDE (DEMADEX) 20 MG TABLET    Take 80 mg (4  tablets in the am) and 40 mg (  2 tablets in the pm). Please start on Sunday 7/20.   TRAMADOL (ULTRAM) 50 MG TABLET    Take 2 tablets (100 mg total) by mouth every 8 (eight) hours as needed.     Physical Exam: Filed Vitals:   05/02/13 1803  BP: 140/68  Pulse: 80  Temp: 98.2 F (36.8 C)  Resp: 19  Weight: 227 lb (102.967 kg)  SpO2: 95%   Physical Exam  Constitutional: He is oriented to person, place, and time. He appears well-developed and well-nourished. No distress.  Obese male  HENT:  Head: Normocephalic and atraumatic.  Right Ear: External ear normal.  Left Ear: External ear normal.  Nose: Nose normal.  Mouth/Throat: Oropharynx is clear and moist.  Eyes: Conjunctivae and EOM are normal. Pupils are equal, round, and reactive to light.  Neck: Normal range of motion. No JVD present.  Cardiovascular: Normal heart sounds and intact distal pulses.   irreg irreg  Pulmonary/Chest: Effort normal and breath sounds normal. No respiratory distress. He has no rales.  Abdominal: Soft. Bowel sounds are normal. He exhibits no distension and no mass. There is no tenderness.  Pannus area with decreased erythema  Musculoskeletal: Normal range of motion.  Lymphadenopathy:    He has no cervical adenopathy.  Neurological: He is alert and oriented to person, place, and time.  Skin:  See abdomen  Psychiatric: He has a normal mood and affect.    Labs reviewed: Basic Metabolic Panel:  Recent Labs  16/10/96 0400  05/28/13 1352 08/16/13 1351 09/25/13 1215  NA 132*  < > 137 134* 136  K 3.4*  < > 4.3 3.9 4.3  CL 82*  < > 97 94* 91*  CO2 39*  < > 31 26 35*  GLUCOSE 168*  < > 168* 96 112*  BUN 87*  < > 57* 64* 83*  CREATININE 1.48*  < > 1.19 1.40* 1.23  CALCIUM 9.9  < > 9.9 9.2 10.0  MG 1.8  --   --   --   --   < > = values in this interval not displayed. Liver Function Tests:  Recent Labs  12/25/12 1403 04/14/13 1320  AST 20 42*  ALT 11 16  ALKPHOS 88 92  BILITOT 1.0 0.7  PROT  9.1* 7.4  ALBUMIN 4.2 3.4*    Recent Labs  12/25/12 1452  LIPASE 41   No results found for this basename: AMMONIA,  in the last 8760 hours CBC:  Recent Labs  12/25/12 1403 12/30/12 1202 04/14/13 1320  04/16/13 0345 04/16/13 0930 04/24/13 0400  WBC 8.5 5.4 7.0  < > 6.8 6.4 6.6  NEUTROABS 7.1 3.0 4.4  --   --   --   --   HGB 12.6* 11.2* 11.3*  < > 9.8* 10.8* 10.0*  HCT 40.0 34.2* 36.1*  < > 33.3* 35.6* 32.2*  MCV 81.8 81.4 85.7  < > 87.4 87.0 85.6  PLT 125* 125* 126*  < > 129* 134* 151  < > = values in this interval not displayed. Cardiac Enzymes:  Recent Labs  04/14/13 1301 04/14/13 1854 04/15/13 0105  TROPONINI <0.30 <0.30 <0.30  CBG:  Recent Labs  04/27/13 0653 04/27/13 1124 04/27/13 1647  GLUCAP 99 168* 204*   Lab Results  Component Value Date   HGBA1C 6.7* 04/19/2013    Imaging and Procedures: None during this past admission  Assessment/Plan 1. Acute on chronic diastolic heart failure -check daily weights, dry weight is 225# -cont fluid and sodium  restriction and current torsemide -labs were drawn and pending--to be sent along to appt at [facility] clinic tomorrow  2. Atrial fibrillation -rate controlled with current therapy, cont asa 325mg   3. CAD -cont secondary prevention with bp, lipid, glucose control  4. DM (diabetes mellitus) type II uncontrolled, periph vascular disorder -last hba1c fairly good, cont to monitor cbgs and cont lantus and meal coverage if needed  5. Hypothyroidism -cont current synthroid  6. Panniculitis -complete abx with ancef and fluconazole, treatment nurse to monitor progress  7. Acute renal failure -resolved, has f/u bmp pending  8. Anasarca -due to #1, may also have a role of cirrhosis with alcohol abuse history?  9. Weakness generalized -here for short term rehab with pt ot with goal to return home  Labs/tests ordered:  Cbc, Bmp already drawn, daily weights, keep appt with Dr. Teressa Lower tomorrow and [facility]

## 2013-05-02 NOTE — Assessment & Plan Note (Signed)
He returns for post hospital follow up. Reviewed discharge summary. Reviewed BMET from Monday, renal function ok. Volume status stable. Continue current diuretic regimen. Would like to keep his weight 229-232 pounds. Continue rehab at SNF. If discharged from SNF will need HHRN. Have provided the recommendation to [facility].

## 2013-05-02 NOTE — Progress Notes (Signed)
Patient ID: [name], male   DOB: 24-Sep-1946, 67 y.o.   MRN: [id number] [name] - PCP at [facility]  Weight Range   270 pounds  Baseline proBNP   4000 09/21/12    HPI: [name] is a 67 yo male with PMX s/f CAD (s/p CABG x 4 in 2001, normal Myoview 2010), chronic diastolic CHF, persistent a-fib, DM, HTN, HL, carotid artery disease, COPD, hypothyroidism, EtOH abuse, chronic venous insufficiency and morbid obesity.  He is not anticoagulants due to ETOH abuse and fall risk.   Echo 09/2011: LVEF 45-50%, mod biatral enlargement, moderate RV dilatation, moderate TR, PASP 66 mmHg.  ECHO 08/2012 EF 55-60% moderate bilateral RA/LA enlargement  Admitted to [facility] 7/5 through 04/27/13 with massive volume overload. Sluggish diuresis with intermittent lasix and he was transitioned to Lasix drip and dopamine drip at 3 mcg per hour. Later switched to torsemide 80 mg in am and 40 mg in pm.  Diuresed over 40 pounds. Discharge weight was 230 pounds. Discharged to [facility]   04/30/13 Creatinine 1.46 Potassium 4.0  He returns for post hospital follow up. Overall his breathing is much better. Denies SOB/PND/Orthopnea. On chronic 2 liters [place]. Weight at [facility] 229-233 pounds. He continues to work with PT/OT. He says he is abe to ambulate with PT in the SNF. Following low salt diet and limiting his fluid intake to < 2 liters per day.           ROS: All systems negative except as listed in HPI, PMH and Problem List.  Past Medical History  Diagnosis Date  . CHRONIC OBSTRUCTIVE PULMONARY DISEASE 06/20/2009  . OBSTRUCTIVE SLEEP APNEA 06/20/2009  . CAROTID STENOSIS 06/20/2009    A. 08/2001 s/p L CEA;  B.   09/14/11 - Carotid U/S - 40-59% bilateral stenosis, left CEA patch angioplasty is patent  . DM 06/20/2009  . CAD 06/20/2009    A.  08/2000 - s/p CABG x 4 - LIMA-LAD, Left Radial-OM, VG-DIAG, VG-RCA;  B. Neg. MV  2010  . HYPERLIPIDEMIA 06/20/2009  . HYPERTENSION 06/20/2009  .  Hypothyroidism   . Low back pain   . Asthma     as child  . Pneumonia   . Atrial fibrillation     Not felt to be coumadin candidate 2/2 ETOH use.  Marland Kitchen ETOH abuse   . History of tobacco abuse     remote - quit 1970  . Bilateral renal cysts   . Marijuana abuse   . Morbidly obese   . CHF (congestive heart failure)   . Falls frequently     Current Outpatient Prescriptions  Medication Sig Dispense Refill  . acetaminophen (TYLENOL) 325 MG tablet Take 2 tablets (650 mg total) by mouth every 4 (four) hours as needed.  60 tablet  3  . aspirin EC 325 MG tablet Take 325 mg by mouth daily.      . carvedilol (COREG) 3.125 MG tablet Take 1 tablet (3.125 mg total) by mouth 2 (two) times daily with a meal. Take with carvedilol 6.25mg  for a total of 9.375mg   60 tablet  1  . carvedilol (COREG) 6.25 MG tablet Take 6.25 mg by mouth 2 (two) times daily with a meal.      . FLUoxetine (PROZAC) 20 MG capsule Take 20 mg by mouth daily.        . fluticasone (FLOVENT HFA) 220 MCG/ACT inhaler Inhale 1 puff into the lungs 2 (two) times daily as  needed (for shortness of breath).       . folic acid (FOLVITE) 1 MG tablet Take 1 tablet (1 mg total) by mouth daily.      Marland Kitchen gabapentin (NEURONTIN) 300 MG capsule Take 1 capsule (300 mg total) by mouth daily.      . hydrALAZINE (APRESOLINE) 25 MG tablet Take 1 tablet (25 mg total) by mouth 3 (three) times daily.  90 tablet  3  . insulin glargine (LANTUS) 100 UNIT/ML injection Inject 10 Units into the skin at bedtime.  10 mL  1  . lactulose (CHRONULAC) 10 GM/15ML solution Take 45 mLs (30 g total) by mouth daily.  240 mL    . levothyroxine (SYNTHROID, LEVOTHROID) 150 MCG tablet Take 150 mcg by mouth daily.       . Multiple Vitamin (MULTIVITAMIN) capsule Take 1 capsule by mouth daily.      Marland Kitchen omeprazole (PRILOSEC) 20 MG capsule Take 20 mg by mouth daily.        Marland Kitchen oxyCODONE 10 MG TABS Take 1 tablet (10 mg total) by mouth every 6 (six) hours as needed.  30 tablet  0  . potassium  chloride SA (K-DUR,KLOR-CON) 20 MEQ tablet Take 2 tablets (40 mEq total) by mouth 2 (two) times daily.  65 tablet  3  . senna (SENOKOT) 8.6 MG tablet Take 2 tablets by mouth daily as needed for constipation.       . simvastatin (ZOCOR) 40 MG tablet Take 40 mg by mouth at bedtime.        Marland Kitchen spironolactone (ALDACTONE) 25 MG tablet Take 1 tablet (25 mg total) by mouth daily.  30 tablet  6  . torsemide (DEMADEX) 20 MG tablet Take 80 mg (4 tablets in the am) and 40 mg (2 tablets in the pm). Please start on Sunday 7/20.  180 tablet  3  . traMADol (ULTRAM) 50 MG tablet Take 2 tablets (100 mg total) by mouth every 8 (eight) hours as needed.  180 tablet  5   No current facility-administered medications for this encounter.     PHYSICAL EXAM: Filed Vitals:   05/02/13 1407  BP: 110/60  Pulse: 58  Weight: 229 lb 6.4 oz (104.055 kg)  SpO2: 99%    General:  Chronically ill appearing. No resp difficulty in wheelchair HEENT: normal Neck: supple. JVP appears flat  Carotids 2+ bilaterally; no bruits. No lymphadenopathy or thryomegaly appreciated. Cor: PMI normal. Regular rate & rhythm. No rubs, gallops. 2/6 SEM RUSB  Lungs: clear on 2 liters [place].  Abdomen: obese, soft, nontender, nondistended. No hepatosplenomegaly. No bruits or masses. Good bowel sounds. Extremities: no cyanosis, clubbing, rash, R and LLE unna boots. Neuro: alert & orientedx3, cranial nerves grossly intact. Moves all 4 extremities w/o difficulty. Affect pleasant.    ASSESSMENT & PLAN:

## 2013-05-10 ENCOUNTER — Other Ambulatory Visit: Payer: Self-pay | Admitting: Geriatric Medicine

## 2013-05-10 MED ORDER — OXYCODONE HCL 10 MG PO TABS: 10.0000 mg | ORAL_TABLET | Freq: Four times a day (QID) | ORAL | Status: DC | PRN

## 2013-05-11 ENCOUNTER — Non-Acute Institutional Stay (SKILLED_NURSING_FACILITY): Payer: Medicare Other | Admitting: Internal Medicine

## 2013-05-11 DIAGNOSIS — E039 Hypothyroidism, unspecified: Secondary | ICD-10-CM

## 2013-05-11 DIAGNOSIS — I5032 Chronic diastolic (congestive) heart failure: Secondary | ICD-10-CM

## 2013-05-11 DIAGNOSIS — E119 Type 2 diabetes mellitus without complications: Secondary | ICD-10-CM

## 2013-05-11 NOTE — Progress Notes (Signed)
Patient ID: [name], male   DOB: 01-Oct-1946, 67 y.o.   MRN: [id number]  [name] living [employer]  Chief Complaint  Patient presents with  . Discharge Note   HPI- 67 y/o male patient is here for STR post hospital admission with chf exacerbation. He has undergone therapt with PT/OT team and made improvement and is stable to be discharged home with home health services. Weight has remained stable in facility.  He was seen in his room today. He is stable and denies any complaints  Current Outpatient Prescriptions on File Prior to Visit  Medication Sig Dispense Refill  . acetaminophen (TYLENOL) 325 MG tablet Take 2 tablets (650 mg total) by mouth every 4 (four) hours as needed.  60 tablet  3  . aspirin EC 325 MG tablet Take 325 mg by mouth daily.      . carvedilol (COREG) 3.125 MG tablet Take 1 tablet (3.125 mg total) by mouth 2 (two) times daily with a meal. Take with carvedilol 6.25mg  for a total of 9.375mg   60 tablet  1  . FLUoxetine (PROZAC) 20 MG capsule Take 20 mg by mouth daily.        . fluticasone (FLOVENT HFA) 220 MCG/ACT inhaler Inhale 1 puff into the lungs 2 (two) times daily as needed (for shortness of breath).       . folic acid (FOLVITE) 1 MG tablet Take 1 tablet (1 mg total) by mouth daily.      Marland Kitchen gabapentin (NEURONTIN) 300 MG capsule Take 1 capsule (300 mg total) by mouth daily.      . hydrALAZINE (APRESOLINE) 25 MG tablet Take 1 tablet (25 mg total) by mouth 3 (three) times daily.  90 tablet  3  . insulin glargine (LANTUS) 100 UNIT/ML injection Inject 10 Units into the skin at bedtime.  10 mL  1  . lactulose (CHRONULAC) 10 GM/15ML solution Take 45 mLs (30 g total) by mouth daily.  240 mL    . levothyroxine (SYNTHROID, LEVOTHROID) 150 MCG tablet Take 150 mcg by mouth daily.       . Multiple Vitamin (MULTIVITAMIN) capsule Take 1 capsule by mouth daily.      Marland Kitchen omeprazole (PRILOSEC) 20 MG capsule Take 20 mg by mouth daily.        . Oxycodone HCl 10 MG TABS Take 1 tablet (10  mg total) by mouth every 6 (six) hours as needed.  120 tablet  0  . potassium chloride SA (K-DUR,KLOR-CON) 20 MEQ tablet Take 2 tablets (40 mEq total) by mouth 2 (two) times daily.  65 tablet  3  . senna (SENOKOT) 8.6 MG tablet Take 2 tablets by mouth daily as needed for constipation.       . simvastatin (ZOCOR) 40 MG tablet Take 40 mg by mouth at bedtime.        Marland Kitchen spironolactone (ALDACTONE) 25 MG tablet Take 1 tablet (25 mg total) by mouth daily.  30 tablet  6  . torsemide (DEMADEX) 20 MG tablet Take 80 mg (4 tablets in the am) and 40 mg (2 tablets in the pm). Please start on Sunday 7/20.  180 tablet  3  . traMADol (ULTRAM) 50 MG tablet Take 2 tablets (100 mg total) by mouth every 8 (eight) hours as needed.  180 tablet  5   No current facility-administered medications on file prior to visit.   Exam- Vss, afebrile  General: Chronically ill appearing but in no acute distress HEENT: PERRLA, MMM, no JVD, No lymphadenopathy  or thryomegaly appreciated.  CVS: Irregular rate and rhythm. No rubs or gallops and has systolic murmur Lungs: CTAB, no wheeze or crackles Abdomen: morbidly obese, soft, nontender, mild distention Extremities: no cyanosis, trace edema bilaterally, able to move all 4 Neuro: alert & orientedx3, cranial nerves grossly intact  Assessment/plan  Patient will be discharged home with home services for PT/OT. DME script for tub bench provided. Scripts for his medications provided. Patient to check blood sugar and monitor weight. To abstain from alcohol  chf- continue b blocker, torsemide, spironolactone, hydralazine, asa and statin  Dm- continue lantus and check blood sugar  Hypothyroidism- continue his thyroid meds  PT/OT and tub bench

## 2013-05-28 ENCOUNTER — Ambulatory Visit (HOSPITAL_COMMUNITY)
Admission: RE | Admit: 2013-05-28 | Discharge: 2013-05-28 | Disposition: A | Discharge status: Home / Self Care | Payer: Medicare Other | Source: Ambulatory Visit | Attending: Internal Medicine | Admitting: Internal Medicine

## 2013-05-28 VITALS — BP 122/58 | HR 63 | Wt 227.8 lb

## 2013-05-28 DIAGNOSIS — Z9181 History of falling: Secondary | ICD-10-CM | POA: Insufficient documentation

## 2013-05-28 DIAGNOSIS — I509 Heart failure, unspecified: Secondary | ICD-10-CM | POA: Insufficient documentation

## 2013-05-28 DIAGNOSIS — I5032 Chronic diastolic (congestive) heart failure: Secondary | ICD-10-CM | POA: Insufficient documentation

## 2013-05-28 DIAGNOSIS — E039 Hypothyroidism, unspecified: Secondary | ICD-10-CM | POA: Insufficient documentation

## 2013-05-28 DIAGNOSIS — I2510 Atherosclerotic heart disease of native coronary artery without angina pectoris: Secondary | ICD-10-CM | POA: Insufficient documentation

## 2013-05-28 DIAGNOSIS — J449 Chronic obstructive pulmonary disease, unspecified: Secondary | ICD-10-CM | POA: Insufficient documentation

## 2013-05-28 DIAGNOSIS — I6529 Occlusion and stenosis of unspecified carotid artery: Secondary | ICD-10-CM | POA: Insufficient documentation

## 2013-05-28 DIAGNOSIS — J4489 Other specified chronic obstructive pulmonary disease: Secondary | ICD-10-CM | POA: Insufficient documentation

## 2013-05-28 DIAGNOSIS — E785 Hyperlipidemia, unspecified: Secondary | ICD-10-CM | POA: Insufficient documentation

## 2013-05-28 DIAGNOSIS — I4891 Unspecified atrial fibrillation: Secondary | ICD-10-CM | POA: Insufficient documentation

## 2013-05-28 DIAGNOSIS — I10 Essential (primary) hypertension: Secondary | ICD-10-CM | POA: Insufficient documentation

## 2013-05-28 LAB — BASIC METABOLIC PANEL
BUN: 57 mg/dL — ABNORMAL HIGH (ref 6–23)
CO2: 31 mEq/L (ref 19–32)
Chloride: 97 mEq/L (ref 96–112)
GFR calc Af Amer: 70 mL/min — ABNORMAL LOW (ref >90)
Potassium: 4.3 mEq/L (ref 3.5–5.1)

## 2013-05-28 NOTE — Progress Notes (Signed)
Patient ID: [name], male   DOB: 01-Oct-1946, 67 y.o.   MRN: [id number] [name] - PCP at [facility]  Weight Range   270 pounds  Baseline proBNP   4000 09/21/12    HPI: [name] is a 67 yo male with PMX s/f CAD (s/p CABG x 4 in 2001, normal Myoview 2010), chronic diastolic CHF, persistent a-fib, DM, HTN, HL, carotid artery disease, COPD, hypothyroidism, EtOH abuse, chronic venous insufficiency and morbid obesity.  He is not anticoagulants due to ETOH abuse and fall risk.   Echo 09/2011: LVEF 45-50%, mod biatral enlargement, moderate RV dilatation, moderate TR, PASP 66 mmHg.  ECHO 08/2012 EF 55-60% moderate bilateral RA/LA enlargement  Admitted to [facility] 7/5 through 04/27/13 with massive volume overload. Sluggish diuresis with intermittent lasix and he was transitioned to Lasix drip and dopamine drip at 3 mcg per hour. Diuresis was sluggish until diamox added with marked improvement. Later switched to torsemide 80 mg in am and 40 mg in pm.  Diuresed over 40 pounds. Discharge weight was 230 pounds. Discharged to [facility]   04/30/13 Creatinine 1.46 Potassium 4.0  Follow up: Since last visit he has been discharged from [facility] 8/1 and is followed by [facility]. Doing well. Denies SOB, orthopnea, edema, or PND. Wearing 2 liters O2 at night. Weight at home 221-225 lbs. Working with PT with HH. Taking medications as prescribed. Not drinking any alcohol. Following low salt diet and limiting fluid to less than 2 liters day.   ROS: All systems negative except as listed in HPI, PMH and Problem List.  Past Medical History  Diagnosis Date  . CHRONIC OBSTRUCTIVE PULMONARY DISEASE 06/20/2009  . OBSTRUCTIVE SLEEP APNEA 06/20/2009  . CAROTID STENOSIS 06/20/2009    A. 08/2001 s/p L CEA;  B.   09/14/11 - Carotid U/S - 40-59% bilateral stenosis, left CEA patch angioplasty is patent  . DM 06/20/2009  . CAD 06/20/2009    A.  08/2000 - s/p CABG x 4 - LIMA-LAD, Left Radial-OM, VG-DIAG,  VG-RCA;  B. Neg. MV  2010  . HYPERLIPIDEMIA 06/20/2009  . HYPERTENSION 06/20/2009  . Hypothyroidism   . Low back pain   . Asthma     as child  . Pneumonia   . Atrial fibrillation     Not felt to be coumadin candidate 2/2 ETOH use.  Marland Kitchen ETOH abuse   . History of tobacco abuse     remote - quit 1970  . Bilateral renal cysts   . Marijuana abuse   . Morbidly obese   . CHF (congestive heart failure)   . Falls frequently     Current Outpatient Prescriptions  Medication Sig Dispense Refill  . acetaminophen (TYLENOL) 325 MG tablet Take 2 tablets (650 mg total) by mouth every 4 (four) hours as needed.  60 tablet  3  . aspirin EC 325 MG tablet Take 325 mg by mouth daily.      . carvedilol (COREG) 3.125 MG tablet Take 1 tablet (3.125 mg total) by mouth 2 (two) times daily with a meal. Take with carvedilol 6.25mg  for a total of 9.375mg   60 tablet  1  . carvedilol (COREG) 6.25 MG tablet Take 6.25 mg by mouth 2 (two) times daily with a meal. Take along with 3.125 mg tabs to equal 9.375 mg      . colchicine 0.6 MG tablet Take 0.6 mg by mouth daily.      Marland Kitchen FLUoxetine (PROZAC) 20 MG capsule Take  20 mg by mouth daily.        . fluticasone (FLOVENT HFA) 220 MCG/ACT inhaler Inhale 1 puff into the lungs 2 (two) times daily as needed (for shortness of breath).       . folic acid (FOLVITE) 1 MG tablet Take 1 tablet (1 mg total) by mouth daily.      Marland Kitchen gabapentin (NEURONTIN) 300 MG capsule Take 1 capsule (300 mg total) by mouth daily.      . hydrALAZINE (APRESOLINE) 25 MG tablet Take 1 tablet (25 mg total) by mouth 3 (three) times daily.  90 tablet  3  . insulin glargine (LANTUS) 100 UNIT/ML injection Inject 10 Units into the skin at bedtime.  10 mL  1  . lactulose (CHRONULAC) 10 GM/15ML solution Take 45 mLs (30 g total) by mouth daily.  240 mL    . levothyroxine (SYNTHROID, LEVOTHROID) 150 MCG tablet Take 150 mcg by mouth daily.       . Multiple Vitamin (MULTIVITAMIN) capsule Take 1 capsule by mouth daily.       Marland Kitchen omeprazole (PRILOSEC) 20 MG capsule Take 20 mg by mouth daily.        . Oxycodone HCl 10 MG TABS Take 1 tablet (10 mg total) by mouth every 6 (six) hours as needed.  120 tablet  0  . potassium chloride SA (K-DUR,KLOR-CON) 20 MEQ tablet Take 40 mEq by mouth daily.      Marland Kitchen senna (SENOKOT) 8.6 MG tablet Take 2 tablets by mouth daily as needed for constipation.       . simvastatin (ZOCOR) 40 MG tablet Take 40 mg by mouth at bedtime.        Marland Kitchen spironolactone (ALDACTONE) 25 MG tablet Take 1 tablet (25 mg total) by mouth daily.  30 tablet  6  . torsemide (DEMADEX) 20 MG tablet Take 80 mg (4 tablets in the am) and 40 mg (2 tablets in the pm). Please start on Sunday 7/20.  180 tablet  3  . traMADol (ULTRAM) 50 MG tablet Take 2 tablets (100 mg total) by mouth every 8 (eight) hours as needed.  180 tablet  5   No current facility-administered medications for this encounter.   PHYSICAL EXAM: Filed Vitals:   05/28/13 1324  BP: 122/58  Pulse: 63  Weight: 227 lb 12 oz (103.307 kg)  SpO2: 94%  Last weight: 229 lbs  General:  Chronically ill appearing. No resp difficulty in wheelchair; daughter present HEENT: normal Neck: supple. JVP appears flat  Carotids 2+ bilaterally; no bruits. No lymphadenopathy or thryomegaly appreciated. Cor: PMI normal. Regular rate & rhythm. No rubs, gallops. 2/6 SEM RUSB  Lungs: CTA Abdomen: obese, soft, nontender, nondistended. No hepatosplenomegaly. No bruits or masses. Good bowel sounds. Extremities: no cyanosis, clubbing, rash, R/LLE trace edema Neuro: alert & orientedx3, cranial nerves grossly intact. Moves all 4 extremities w/o difficulty. Affect pleasant.  ASSESSMENT & PLAN:  1) Chronic diastolic HF, EF 55-60% - Doing great since being home. Weight is stable 221-225 lbs. Denies any SOB,orthopnea or edema.  - Volume status good, will continue current diuretics.Reinforced need for daily weights and reviewed use of sliding scale diuretics. - Check BMET today.  2)  A-Fib - Stable, rate controlled. continue ASA  - Abstaining from ETOH can consider trying NOAC next visit, but concerned with possible varices  3) HTN -SBP 120's, controlled - Continue hydralazine, coreg, and spiro  4) CAD - no s/s of ischemia will continue ASA, BB and statin  Reuel Boom  Beth Spackman,MD 8:04 PM

## 2013-05-28 NOTE — Patient Instructions (Addendum)
Doing great, keep up the good work.  Call if you notice your weight starts going up.  Follow up 4-6 weeks.

## 2013-06-14 ENCOUNTER — Encounter: Payer: Self-pay | Admitting: Cardiovascular Disease

## 2013-06-14 ENCOUNTER — Ambulatory Visit (INDEPENDENT_AMBULATORY_CARE_PROVIDER_SITE_OTHER): Payer: Medicare Other | Admitting: Cardiovascular Disease

## 2013-06-14 VITALS — BP 127/67 | HR 65 | Ht 69.0 in | Wt 228.0 lb

## 2013-06-14 DIAGNOSIS — I2510 Atherosclerotic heart disease of native coronary artery without angina pectoris: Secondary | ICD-10-CM

## 2013-06-14 DIAGNOSIS — I4891 Unspecified atrial fibrillation: Secondary | ICD-10-CM

## 2013-06-14 DIAGNOSIS — I5032 Chronic diastolic (congestive) heart failure: Secondary | ICD-10-CM

## 2013-06-14 DIAGNOSIS — I509 Heart failure, unspecified: Secondary | ICD-10-CM

## 2013-06-14 NOTE — Progress Notes (Signed)
History of Present Illness: 67 yo WM with history of CAD s/p 4V CABG 2001, atrial fibrillation, COPD, OSA, obesity, venous insufficiency and diastolic CHF here for cardiac follow up. He has been followed by Dr. Juanda Chance. I saw him for the first time in May 2012. He had bypass surgery in 2001. He had a negative Myoview scan in 2010. In 2010 he developed paroxysmal fibrillation and has been in persistent atrial fibrillation. He was not felt to be a Coumadin candidate because of alcohol consumption. He did have a [place] score 3. Carotid artery dopplers December 2012 with stable bilateral disease (40-59% bilateral stenosis, left CEA patch angioplasty is patent). He was admitted to [facility] December 2012 with pneumonia, volume overload, AMS, had mild bump in troponin. This was felt to be demand ischemia.  He has been followed in the CHF clinic. Last ECHO 08/2012 EF 55-60% moderate bilateral RA/LA enlargement. Admitted to [facility] 04/14/13 through 04/27/13 with massive volume overload. Sluggish diuresis with intermittent lasix and he was transitioned to Lasix drip and dopamine drip at 3 mcg per hour. Diuresis was sluggish until diamox added with marked improvement. Later switched to torsemide 80 mg in am and 40 mg in pm. Diuresed over 40 pounds. Discharge weight was 230 pounds. He was seen in the CHF clinic 05/28/13. He wears O2 at night.   He is here today for follow up. He is doing well. Weight is stable. He describes pain in the center of his chest with rest and with exertion. No change in breathing. He gets around but slowly. He is drinking alcohol again. Admits to several drinks this week.   Primary Care Physician: [name]  Past Medical History  Diagnosis Date  . CHRONIC OBSTRUCTIVE PULMONARY DISEASE 06/20/2009  . OBSTRUCTIVE SLEEP APNEA 06/20/2009  . CAROTID STENOSIS 06/20/2009    A. 08/2001 s/p L CEA;  B.   09/14/11 - Carotid U/S - 40-59% bilateral stenosis, left CEA patch angioplasty is patent  .  DM 06/20/2009  . CAD 06/20/2009    A.  08/2000 - s/p CABG x 4 - LIMA-LAD, Left Radial-OM, VG-DIAG, VG-RCA;  B. Neg. MV  2010  . HYPERLIPIDEMIA 06/20/2009  . HYPERTENSION 06/20/2009  . Hypothyroidism   . Low back pain   . Asthma     as child  . Pneumonia   . Atrial fibrillation     Not felt to be coumadin candidate 2/2 ETOH use.  Marland Kitchen ETOH abuse   . History of tobacco abuse     remote - quit 1970  . Bilateral renal cysts   . Marijuana abuse   . Morbidly obese   . CHF (congestive heart failure)   . Falls frequently     Past Surgical History  Procedure Laterality Date  . Carotid endarterectomy  2002    left  . Coronary artery bypass graft      x 4 - 2001    Current Outpatient Prescriptions  Medication Sig Dispense Refill  . aspirin EC 325 MG tablet Take 325 mg by mouth daily.      . carvedilol (COREG) 3.125 MG tablet Take 1 tablet (3.125 mg total) by mouth 2 (two) times daily with a meal. Take with carvedilol 6.25mg  for a total of 9.375mg   60 tablet  1  . carvedilol (COREG) 6.25 MG tablet Take 6.25 mg by mouth 2 (two) times daily with a meal. Take along with 3.125 mg tabs to equal 9.375 mg      .  colchicine 0.6 MG tablet Take 0.6 mg by mouth daily.      Marland Kitchen FLUoxetine (PROZAC) 20 MG capsule Take 20 mg by mouth daily.        . fluticasone (FLOVENT HFA) 220 MCG/ACT inhaler Inhale 1 puff into the lungs 2 (two) times daily as needed (for shortness of breath).       . folic acid (FOLVITE) 1 MG tablet Take 1 tablet (1 mg total) by mouth daily.      Marland Kitchen gabapentin (NEURONTIN) 300 MG capsule Take 1 capsule (300 mg total) by mouth daily.      . hydrALAZINE (APRESOLINE) 25 MG tablet Take 1 tablet (25 mg total) by mouth 3 (three) times daily.  90 tablet  3  . insulin glargine (LANTUS) 100 UNIT/ML injection Inject 10 Units into the skin at bedtime.  10 mL  1  . lactulose (CHRONULAC) 10 GM/15ML solution Take 45 mLs (30 g total) by mouth daily.  240 mL    . levothyroxine (SYNTHROID, LEVOTHROID) 150  MCG tablet Take 150 mcg by mouth daily.       . Multiple Vitamin (MULTIVITAMIN) capsule Take 1 capsule by mouth daily.      Marland Kitchen omeprazole (PRILOSEC) 20 MG capsule Take 20 mg by mouth daily.        . Oxycodone HCl 10 MG TABS Take 1 tablet (10 mg total) by mouth every 6 (six) hours as needed.  120 tablet  0  . potassium chloride SA (K-DUR,KLOR-CON) 20 MEQ tablet Take 40 mEq by mouth daily.      Marland Kitchen senna (SENOKOT) 8.6 MG tablet Take 2 tablets by mouth daily as needed for constipation.       . simvastatin (ZOCOR) 40 MG tablet Take 40 mg by mouth at bedtime.        Marland Kitchen spironolactone (ALDACTONE) 25 MG tablet Take 1 tablet (25 mg total) by mouth daily.  30 tablet  6  . torsemide (DEMADEX) 20 MG tablet Take 80 mg (4 tablets in the am) and 40 mg (2 tablets in the pm). Please start on Sunday 7/20.  180 tablet  3   No current facility-administered medications for this visit.    Allergies  Allergen Reactions  . Erythromycin Hives    History   Social History  . Marital Status: Widowed    Spouse Name: N/A    Number of Children: N/A  . Years of Education: N/A   Occupational History  . retired    Social History Main Topics  . Smoking status: Former Smoker    Quit date: 10/11/1968  . Smokeless tobacco: Not on file  . Alcohol Use: Yes     Comment: wild [place] daily;  also reports regularly drinking a pint of vodka.  . Drug Use: Yes    Special: Marijuana     Comment: last 1 year ago  . Sexual Activity: Not on file   Other Topics Concern  . Not on file   Social History Narrative   Lives in [place] with dtr, son-in-law    Family History  Problem Relation Age of Onset  . Stroke Mother     ?  . Stroke Father     ?    Review of Systems:  As stated in the HPI and otherwise negative.   BP 127/67  Pulse 65  Ht 5\' 9"  (1.753 m)  Wt 228 lb (103.42 kg)  BMI 33.65 kg/m2  Physical Examination: General: Well developed, well nourished, NAD HEENT: OP  clear, mucus membranes moist SKIN:  warm, dry. No rashes. Neuro: No focal deficits Musculoskeletal: Muscle strength 5/5 all ext Psychiatric: Mood and affect normal Neck: No JVD, no carotid bruits, no thyromegaly, no lymphadenopathy. Lungs:Clear bilaterally, no wheezes, rhonci, crackles Cardiovascular: Regular rate and rhythm. No murmurs, gallops or rubs. Abdomen:Soft. Bowel sounds present. Non-tender.  Extremities: No lower extremity edema. Pulses are 2 + in the bilateral DP/PT.  EKG: Atrial fib, rate 65 bpm.   Assessment and Plan:   1. CAD: Recent chest pain concerning for angina. Will arrange Lexiscan stress myoview to exclude ischemia. Continue current meds.   2. Atrial fibrillation: Rate controlled. Not a good coumadin candidate. We have discussed the newer agents. He is drinking again. Would hold off fo rnow. Continue current therapy.  3. DIASTOLIC HEART FAILURE:  Volume status ok today. Weights are stable. Will continue Lasix 80 mg Demadex 80 mg po QAM, 40 mg QPM. Follow up as planned in CHF clinic.

## 2013-06-14 NOTE — Patient Instructions (Addendum)
Your physician wants you to follow-up in: 6 months.   You will receive a reminder letter in the mail two months in advance. If you don't receive a letter, please call our office to schedule the follow-up appointment.  Your physician has requested that you have a lexiscan myoview. For further information please visit [email or web address]. Please follow instruction sheet, as given.   

## 2013-06-18 ENCOUNTER — Other Ambulatory Visit: Payer: Self-pay | Admitting: Internal Medicine

## 2013-06-27 ENCOUNTER — Ambulatory Visit (HOSPITAL_COMMUNITY): Payer: Medicare Other | Attending: Cardiology | Admitting: Radiology

## 2013-06-27 VITALS — BP 107/67 | HR 61 | Ht 69.0 in | Wt 228.0 lb

## 2013-06-27 DIAGNOSIS — I739 Peripheral vascular disease, unspecified: Secondary | ICD-10-CM | POA: Insufficient documentation

## 2013-06-27 DIAGNOSIS — E669 Obesity, unspecified: Secondary | ICD-10-CM | POA: Insufficient documentation

## 2013-06-27 DIAGNOSIS — R0602 Shortness of breath: Secondary | ICD-10-CM | POA: Insufficient documentation

## 2013-06-27 DIAGNOSIS — I10 Essential (primary) hypertension: Secondary | ICD-10-CM | POA: Insufficient documentation

## 2013-06-27 DIAGNOSIS — R42 Dizziness and giddiness: Secondary | ICD-10-CM | POA: Insufficient documentation

## 2013-06-27 DIAGNOSIS — I4891 Unspecified atrial fibrillation: Secondary | ICD-10-CM | POA: Insufficient documentation

## 2013-06-27 DIAGNOSIS — I6529 Occlusion and stenosis of unspecified carotid artery: Secondary | ICD-10-CM | POA: Insufficient documentation

## 2013-06-27 DIAGNOSIS — Z87891 Personal history of nicotine dependence: Secondary | ICD-10-CM | POA: Insufficient documentation

## 2013-06-27 DIAGNOSIS — E119 Type 2 diabetes mellitus without complications: Secondary | ICD-10-CM | POA: Insufficient documentation

## 2013-06-27 DIAGNOSIS — R079 Chest pain, unspecified: Secondary | ICD-10-CM | POA: Insufficient documentation

## 2013-06-27 DIAGNOSIS — I2510 Atherosclerotic heart disease of native coronary artery without angina pectoris: Secondary | ICD-10-CM

## 2013-06-27 DIAGNOSIS — Z951 Presence of aortocoronary bypass graft: Secondary | ICD-10-CM | POA: Insufficient documentation

## 2013-06-27 MED ORDER — REGADENOSON 0.4 MG/5ML IV SOLN
0.4000 mg | Freq: Once | INTRAVENOUS | Status: AC
  Administered 2013-06-27: 0.4 mg via INTRAVENOUS

## 2013-06-27 MED ORDER — TECHNETIUM TC 99M SESTAMIBI GENERIC - CARDIOLITE
30.0000 | Freq: Once | INTRAVENOUS | Status: AC | PRN
  Administered 2013-06-27: 30 via INTRAVENOUS

## 2013-06-27 MED ORDER — TECHNETIUM TC 99M SESTAMIBI GENERIC - CARDIOLITE
10.0000 | Freq: Once | INTRAVENOUS | Status: AC | PRN
  Administered 2013-06-27: 10 via INTRAVENOUS

## 2013-06-27 NOTE — Progress Notes (Signed)
[facility] SITE 3 NUCLEAR MED [address] [place], [place] [address] [phone]    Cardiology Nuclear Med Study  [name] is a 67 y.o. male     MRN : [id number]     DOB: 1946/02/05  Procedure Date: 06/27/2013  Nuclear Med Background Indication for Stress Test:  Evaluation for Ischemia and Graft Patency History:  '01 CABG, EF=60%; '10 WNU:[id number] mild anterior ischemia; '13 Echo:EF=60%; h/o atrial fib Cardiac Risk Factors: Carotid Disease, History of Smoking, Hypertension, IDDM Type 2, Lipids, Obesity and PVD  Symptoms:  Chest Pain with and without Exertion (last episode of chest discomfort was about 3-4 weeks ago), Dizziness and SOB   Nuclear Pre-Procedure Caffeine/Decaff Intake:  None > 12 hrs NPO After: 7:00pm   Lungs:  Clear. O2 Sat: 92% on room air. IV 0.9% NS with Angio Cath:  20g  IV Site: R Antecubital x 1, tolerated well IV Started by:  Irean Hong, RN  Chest Size (in):  46 Cup Size: n/a  Height: 5\' 9"  (1.753 m)  Weight:  228 lb (103.42 kg)  BMI:  Body mass index is 33.65 kg/(m^2). Tech Comments:  Took Coreg this am. 1/2 dose Lantus Insulin last night; no insulin today. Fasting CBG was 146 at 0830. Irean Hong, RN.    Nuclear Med Study 1 or 2 day study: 1 day  Stress Test Type:  Lexiscan  Reading MD: Willa Rough, MD  Order Authorizing Provider:  Verne Carrow, MD  Resting Radionuclide: Technetium 36m Sestamibi  Resting Radionuclide Dose: 11.0 mCi   Stress Radionuclide:  Technetium 105m Sestamibi  Stress Radionuclide Dose: 33.0 mCi           Stress Protocol Rest HR: 61 Stress HR: 68  Rest BP: 107/67 Stress BP: 103/62  Exercise Time (min): n/a METS: n/a   Predicted Max HR: 151 bpm % Max HR: 45.03 bpm Rate Pressure Product: 7004   Dose of Adenosine (mg):  n/a Dose of Lexiscan: 0.4 mg  Dose of Atropine (mg): n/a Dose of Dobutamine: n/a mcg/kg/min (at max HR)  Stress Test Technologist: Smiley Houseman, CMA-N  Nuclear Technologist:   Domenic Polite, CNMT     Rest Procedure:  Myocardial perfusion imaging was performed at rest 45 minutes following the intravenous administration of Technetium 35m Sestamibi.  Rest ECG: Atrial fibrillation  Stress Procedure:  The patient received IV Lexiscan 0.4 mg over 15-seconds.  Technetium 3m Sestamibi injected at 30-seconds.  He c/o feeling lightheaded with Lexiscan.  Quantitative spect images were obtained after a 45 minute delay.  Stress ECG: No significant change from baseline ECG  QPS Raw Data Images:  Normal; no motion artifact; normal heart/lung ratio. Stress Images:  Small area of moderate decreased activity at the base/mid inferolateral wall. This appears fixed Rest Images:  Small area of moderate decreased activity at the base/mid inferolateral wall Subtraction (SDS):  No definite ischemia Transient Ischemic Dilatation (Normal <1.22):  n/a Lung/Heart Ratio (Normal <0.45):  0.39  Quantitative Gated Spect Images QGS EDV:  n/a ml QGS ESV:  n/a ml  Impression Exercise Capacity:  Lexiscan with no exercise. BP Response:  Normal blood pressure response. Clinical Symptoms:  Head feels funny ECG Impression:  No significant ST segment change suggestive of ischemia. Comparison with Prior Nuclear Study: No images to compare  Overall Impression:  Small area of mild decreased uptake in the inferolateral wall it is fixed. This is consistent with a small area of scar. The study is not gated. Therefore wall  motion data is not available. This is most probably a low risk scan, but this cannot be stated definitively without knowing wall motion.  LV Ejection Fraction: Study not gated.  LV Wall Motion:     Wall motion data not available as the study was not gated because of atrial fibrillation   Willa Rough, MD

## 2013-07-02 ENCOUNTER — Encounter (HOSPITAL_COMMUNITY): Payer: Medicare Other

## 2013-07-03 ENCOUNTER — Other Ambulatory Visit (HOSPITAL_COMMUNITY): Payer: Self-pay | Admitting: Cardiovascular Disease

## 2013-07-03 ENCOUNTER — Other Ambulatory Visit: Payer: Self-pay

## 2013-07-10 ENCOUNTER — Other Ambulatory Visit: Payer: Self-pay | Admitting: Internal Medicine

## 2013-07-11 ENCOUNTER — Ambulatory Visit (HOSPITAL_COMMUNITY)
Admission: RE | Admit: 2013-07-11 | Discharge: 2013-07-11 | Disposition: A | Discharge status: Home / Self Care | Payer: Medicare Other | Source: Ambulatory Visit | Attending: Internal Medicine | Admitting: Internal Medicine

## 2013-07-11 VITALS — BP 112/50 | HR 69 | Wt 236.1 lb

## 2013-07-11 DIAGNOSIS — I5032 Chronic diastolic (congestive) heart failure: Secondary | ICD-10-CM

## 2013-07-11 DIAGNOSIS — I4891 Unspecified atrial fibrillation: Secondary | ICD-10-CM

## 2013-07-11 MED ORDER — TORSEMIDE 20 MG PO TABS: ORAL_TABLET | ORAL | Status: DC

## 2013-07-11 NOTE — Progress Notes (Signed)
Patient ID: DELMORE SEAR, male   DOB: 1945-11-17, 67 y.o.   MRN: [id number] Patient ID: QUASHAUN LAZALDE, male   DOB: 1945-10-24, 67 y.o.   MRN: [id number] [name] - PCP at [facility]  Weight Range   270 pounds  Baseline proBNP   4000 09/21/12    HPI: Mr. Blough is a 67 yo male with PMX s/f CAD (s/p CABG x 4 in 2001, normal Myoview 2010), chronic diastolic CHF, persistent a-fib, DM, HTN, HL, carotid artery disease, COPD, hypothyroidism, EtOH abuse, chronic venous insufficiency and morbid obesity.  He is not anticoagulants due to ETOH abuse and fall risk.   Echo 09/2011: LVEF 45-50%, mod biatral enlargement, moderate RV dilatation, moderate TR, PASP 66 mmHg.  ECHO 08/2012 EF 55-60% moderate bilateral RA/LA enlargement  Admitted to [facility] 7/5 through 04/27/13 with massive volume overload. Sluggish diuresis with intermittent lasix and he was transitioned to Lasix drip and dopamine drip at 3 mcg per hour. Diuresis was sluggish until diamox added with marked improvement. Later switched to torsemide 80 mg in am and 40 mg in pm.  Diuresed over 40 pounds. Discharge weight was 230 pounds. Discharged to [facility]   04/30/13 Creatinine 1.46 Potassium 4.0 05/28/13 Creatinine 1.19 Potassium 4.3  He returns for follow up. He stopped taking afternoon torsemide and his weight has been trending up.  Weight at home trending up 222-231 pounds. Ambulates with a walker. Denies SOB/PND/Orthopnea. Followed by [facility]. Had fried squash and fired chicken last night.    ROS: All systems negative except as listed in HPI, PMH and Problem List.  Past Medical History  Diagnosis Date  . CHRONIC OBSTRUCTIVE PULMONARY DISEASE 06/20/2009  . OBSTRUCTIVE SLEEP APNEA 06/20/2009  . CAROTID STENOSIS 06/20/2009    A. 08/2001 s/p L CEA;  B.   09/14/11 - Carotid U/S - 40-59% bilateral stenosis, left CEA patch angioplasty is patent  . DM 06/20/2009  . CAD 06/20/2009    A.  08/2000 - s/p CABG x 4 - LIMA-LAD, Left  Radial-OM, VG-DIAG, VG-RCA;  B. Neg. MV  2010  . HYPERLIPIDEMIA 06/20/2009  . HYPERTENSION 06/20/2009  . Hypothyroidism   . Low back pain   . Asthma     as child  . Pneumonia   . Atrial fibrillation     Not felt to be coumadin candidate 2/2 ETOH use.  Marland Kitchen ETOH abuse   . History of tobacco abuse     remote - quit 1970  . Bilateral renal cysts   . Marijuana abuse   . Morbidly obese   . CHF (congestive heart failure)   . Falls frequently     Current Outpatient Prescriptions  Medication Sig Dispense Refill  . aspirin EC 325 MG tablet Take 325 mg by mouth daily.      . carvedilol (COREG) 3.125 MG tablet TAKE 1 TABLET BY MOUTH TWICE DAILY WITH MEAL. TAKE WITH 6.25MG  TO TOTAL 9.375MG .  60 tablet  1  . carvedilol (COREG) 6.25 MG tablet Take 6.25 mg by mouth 2 (two) times daily with a meal. Take along with 3.125 mg tabs to equal 9.375 mg      . colchicine 0.6 MG tablet Take 0.6 mg by mouth daily.      Marland Kitchen FLUoxetine (PROZAC) 20 MG capsule Take 20 mg by mouth daily.        . fluticasone (FLOVENT HFA) 220 MCG/ACT inhaler Inhale 1 puff into the lungs 2 (two) times daily as needed (for shortness  of breath).       . folic acid (FOLVITE) 1 MG tablet Take 1 tablet (1 mg total) by mouth daily.      Marland Kitchen gabapentin (NEURONTIN) 300 MG capsule Take 1 capsule (300 mg total) by mouth daily.      . hydrALAZINE (APRESOLINE) 25 MG tablet Take 1 tablet (25 mg total) by mouth 3 (three) times daily.  90 tablet  3  . insulin glargine (LANTUS) 100 UNIT/ML injection Inject 10 Units into the skin at bedtime.  10 mL  1  . lactulose (CHRONULAC) 10 GM/15ML solution Take 45 mLs (30 g total) by mouth daily.  240 mL    . levothyroxine (SYNTHROID, LEVOTHROID) 150 MCG tablet Take 150 mcg by mouth daily.       . Multiple Vitamin (MULTIVITAMIN) capsule Take 1 capsule by mouth daily.      Marland Kitchen omeprazole (PRILOSEC) 20 MG capsule Take 20 mg by mouth daily.        . Oxycodone HCl 10 MG TABS Take 1 tablet (10 mg total) by mouth every 6  (six) hours as needed.  120 tablet  0  . potassium chloride SA (K-DUR,KLOR-CON) 20 MEQ tablet Take 40 mEq by mouth daily.      Marland Kitchen senna (SENOKOT) 8.6 MG tablet Take 2 tablets by mouth daily as needed for constipation.       . simvastatin (ZOCOR) 40 MG tablet Take 40 mg by mouth at bedtime.        Marland Kitchen spironolactone (ALDACTONE) 25 MG tablet Take 1 tablet (25 mg total) by mouth daily.  30 tablet  6  . torsemide (DEMADEX) 20 MG tablet Take 80 mg (4 tablets in the am)       No current facility-administered medications for this encounter.   PHYSICAL EXAM: Filed Vitals:   07/11/13 1151  BP: 112/50  Pulse: 69  Weight: 236 lb 1.9 oz (107.103 kg)  SpO2: 89%  Last weight: 229> 236  lbs  General:  Chronically ill appearing. No resp difficulty in wheelchair; daughter present HEENT: normal Neck: supple. JVP appears ~10  Carotids 2+ bilaterally; no bruits. No lymphadenopathy or thryomegaly appreciated. Cor: PMI normal. Regular rate & rhythm. No rubs, gallops. 2/6 SEM RUSB  Lungs: CTA Abdomen: obese, soft, nontender, nondistended. No hepatosplenomegaly. No bruits or masses. Good bowel sounds. Extremities: no cyanosis, clubbing, rash, R/LLE 1+ edema Neuro: alert & orientedx3, cranial nerves grossly intact. Moves all 4 extremities w/o difficulty. Affect pleasant.  ASSESSMENT & PLAN:  1) Chronic diastolic HF, EF 55-60% - - Volume status elevated but this is likely due to medication noncompliance because he stopped afternoon torsemide. Would like his weight less than 225 pounds. -  Instructed to take 80 mg torsemide in am and restart 40 mg every pm.  -Reinforced need for daily weights and reviewed use of sliding scale diuretics, low salt food choices, and limiting fluid intake < 2 liters per day. [facility] to check BMET next week and continue services   2) A-Fib - Stable, rate controlled. continue ASA  - Abstaining from ETOH can consider trying NOAC next visit, but concerned with possible  varices  3) HTN -SBP 120's, controlled - Continue hydralazine, coreg, and spiro  4) CAD - no s/s of ischemia will continue ASA, BB and statin   Follow up in 4-6 weeks to reassess volume status.  Fern Canova 12:10 PM

## 2013-07-11 NOTE — Patient Instructions (Addendum)
Follow up in 4-6 weeks  Take torsemide 80 mg in the morning and 40 mg in the afternoon  Do the following things EVERYDAY: 1) Weigh yourself in the morning before breakfast. Write it down and keep it in a log. 2) Take your medicines as prescribed 3) Eat low salt foods-Limit salt (sodium) to 2000 mg per day.  4) Stay as active as you can everyday 5) Limit all fluids for the day to less than 2 liters

## 2013-07-17 ENCOUNTER — Telehealth: Payer: Self-pay | Admitting: Cardiovascular Disease

## 2013-07-17 ENCOUNTER — Encounter: Payer: Self-pay | Admitting: Internal Medicine

## 2013-07-17 NOTE — Telephone Encounter (Signed)
I placed call to [facility] and left message to call back tomorrow. Previous BUN 57. Pt is seen in [facility]

## 2013-07-17 NOTE — Telephone Encounter (Signed)
New problem:  [place] from [employer] is calling to report a BUN level...  BUN: 46

## 2013-07-19 NOTE — Telephone Encounter (Signed)
Labs reviewed by Ulla Potash, NP bun 46 cr 1.20 which is stable for pt

## 2013-07-31 ENCOUNTER — Telehealth (HOSPITAL_COMMUNITY): Payer: Self-pay | Admitting: *Deleted

## 2013-07-31 NOTE — Telephone Encounter (Signed)
Received labs from [facility], K 5.5, bun 51 cr 1.4 per Ulla Potash, NP stop KCL recheck at OV, pt's daughter [name] is aware and agreeable, she states pt is doing well, will stop KCL and recheck at OV 11/6

## 2013-08-02 ENCOUNTER — Telehealth (HOSPITAL_COMMUNITY): Payer: Self-pay | Admitting: Cardiology

## 2013-08-02 MED ORDER — ACETAZOLAMIDE 250 MG PO TABS: 250.0000 mg | ORAL_TABLET | Freq: Two times a day (BID) | ORAL | Status: DC

## 2013-08-02 NOTE — Telephone Encounter (Signed)
DAUGHTER CALLED WHILE NURSE WAS DOING HOME VISIT TODAY FAMILY/ NURSE IS CONCERNED ABOUT WEIGHT GAIN +15LBS  NORMALLY PT WEIGHS 225 RANGE TODAY HE WEIGHS 240 ** PT DOES NOT WEIGH EVERYDAY** C/O EDEMA AND SOB DENIES COUGH AND LACK OF ENERGY   PLEASE ADVISE

## 2013-08-02 NOTE — Telephone Encounter (Signed)
Per Dr [name] give Diamox 250 mg bid for 3 days, called [name] and Left message to call back

## 2013-08-03 NOTE — Telephone Encounter (Signed)
Spoke w/[name], she states she received the VM last night and her husband is gone now to pick up the medication, if pt not doing better by Monday she will let us know

## 2013-08-06 ENCOUNTER — Other Ambulatory Visit (HOSPITAL_COMMUNITY): Payer: Self-pay | Admitting: *Deleted

## 2013-08-06 MED ORDER — SPIRONOLACTONE 25 MG PO TABS: 25.0000 mg | ORAL_TABLET | Freq: Every day | ORAL | Status: DC

## 2013-08-16 ENCOUNTER — Ambulatory Visit (HOSPITAL_COMMUNITY)
Admission: RE | Admit: 2013-08-16 | Discharge: 2013-08-16 | Disposition: A | Discharge status: Home / Self Care | Payer: Medicare Other | Source: Ambulatory Visit | Attending: Internal Medicine | Admitting: Internal Medicine

## 2013-08-16 VITALS — BP 122/64 | HR 72 | Wt 246.0 lb

## 2013-08-16 DIAGNOSIS — I5033 Acute on chronic diastolic (congestive) heart failure: Secondary | ICD-10-CM

## 2013-08-16 DIAGNOSIS — I2510 Atherosclerotic heart disease of native coronary artery without angina pectoris: Secondary | ICD-10-CM

## 2013-08-16 DIAGNOSIS — I10 Essential (primary) hypertension: Secondary | ICD-10-CM | POA: Insufficient documentation

## 2013-08-16 DIAGNOSIS — I5032 Chronic diastolic (congestive) heart failure: Secondary | ICD-10-CM | POA: Insufficient documentation

## 2013-08-16 DIAGNOSIS — I4891 Unspecified atrial fibrillation: Secondary | ICD-10-CM | POA: Insufficient documentation

## 2013-08-16 LAB — PRO B NATRIURETIC PEPTIDE: Pro B Natriuretic peptide (BNP): 6560 pg/mL — ABNORMAL HIGH (ref 0–125)

## 2013-08-16 LAB — BASIC METABOLIC PANEL
CO2: 26 mEq/L (ref 19–32)
Chloride: 94 mEq/L — ABNORMAL LOW (ref 96–112)
Creatinine, Ser: 1.4 mg/dL — ABNORMAL HIGH (ref 0.50–1.35)
Glucose, Bld: 96 mg/dL (ref 70–99)

## 2013-08-16 MED ORDER — PREDNISONE 20 MG PO TABS: 40.0000 mg | ORAL_TABLET | Freq: Every day | ORAL | Status: DC

## 2013-08-16 MED ORDER — TORSEMIDE 20 MG PO TABS: ORAL_TABLET | ORAL | Status: DC

## 2013-08-16 NOTE — Patient Instructions (Signed)
Will increase torsemide to 80 mg twice a day.  Take a 2.5 mg metolazone today.   [facility] next [facility].  Take prednisone 40 mg for 3 days.  F/U 3 weeks.  Do the following things EVERYDAY: 1) Weigh yourself in the morning before breakfast. Write it down and keep it in a log. 2) Take your medicines as prescribed 3) Eat low salt foods-Limit salt (sodium) to 2000 mg per day.  4) Stay as active as you can everyday 5) Limit all fluids for the day to less than 2 liters

## 2013-08-16 NOTE — Progress Notes (Signed)
Patient ID: [name], male   DOB: 09/17/1946, 67 y.o.   MRN: [id number] [name] - PCP at [facility]  Weight Range   270 pounds  Baseline proBNP   4000 09/21/12    HPI: [name] is a 67 yo male with PMX s/f CAD (s/p CABG x 4 in 2001, normal Myoview 2010), chronic diastolic CHF, persistent a-fib, DM, HTN, HL, carotid artery disease, COPD, hypothyroidism, EtOH abuse, chronic venous insufficiency and morbid obesity.  He is not anticoagulants due to ETOH abuse and fall risk.   Echo 09/2011: LVEF 45-50%, mod biatral enlargement, moderate RV dilatation, moderate TR, PASP 66 mmHg.  ECHO 08/2012 EF 55-60% moderate bilateral RA/LA enlargement  Admitted to [facility] 7/5 through 04/27/13 with massive volume overload. Sluggish diuresis with intermittent lasix and he was transitioned to Lasix drip and dopamine drip at 3 mcg per hour. Diuresis was sluggish until diamox added with marked improvement. Later switched to torsemide 80 mg in am and 40 mg in pm.  Diuresed over 40 pounds. Discharge weight was 230 pounds. Discharged to [facility]   04/30/13 Creatinine 1.46 Potassium 4.0 05/28/13 Creatinine 1.19 Potassium 4.3 07/17/13 Creatinine 1.2, K+ 4.7  Follow up: Last visit patient restarted back his 40 mg qpm of torsemide. Took 3 days of diamox 250 mg 10/23 and he reports his weight came down, but now going back up. Getting over a cold and coughing up brown sputum. Denies SOB, PND, orthopnea or CP. Weight at home 237-239 lbs. No longer drinking alcohol. Eating fast food with lots of salt. Drinking less than 2L a day. Still followed by [facility].   ROS: All systems negative except as listed in HPI, PMH and Problem List.  Past Medical History  Diagnosis Date  . CHRONIC OBSTRUCTIVE PULMONARY DISEASE 06/20/2009  . OBSTRUCTIVE SLEEP APNEA 06/20/2009  . CAROTID STENOSIS 06/20/2009    A. 08/2001 s/p L CEA;  B.   09/14/11 - Carotid U/S - 40-59% bilateral stenosis, left CEA patch angioplasty is patent   . DM 06/20/2009  . CAD 06/20/2009    A.  08/2000 - s/p CABG x 4 - LIMA-LAD, Left Radial-OM, VG-DIAG, VG-RCA;  B. Neg. MV  2010  . HYPERLIPIDEMIA 06/20/2009  . HYPERTENSION 06/20/2009  . Hypothyroidism   . Low back pain   . Asthma     as child  . Pneumonia   . Atrial fibrillation     Not felt to be coumadin candidate 2/2 ETOH use.  Marland Kitchen ETOH abuse   . History of tobacco abuse     remote - quit 1970  . Bilateral renal cysts   . Marijuana abuse   . Morbidly obese   . CHF (congestive heart failure)   . Falls frequently     Current Outpatient Prescriptions  Medication Sig Dispense Refill  . aspirin EC 325 MG tablet Take 325 mg by mouth daily.      . carvedilol (COREG) 3.125 MG tablet TAKE 1 TABLET BY MOUTH TWICE DAILY WITH MEAL. TAKE WITH 6.25MG  TO TOTAL 9.375MG .  60 tablet  1  . carvedilol (COREG) 6.25 MG tablet Take 6.25 mg by mouth 2 (two) times daily with a meal. Take along with 3.125 mg tabs to equal 9.375 mg      . colchicine 0.6 MG tablet Take 0.6 mg by mouth daily.      Marland Kitchen FLUoxetine (PROZAC) 20 MG capsule Take 20 mg by mouth daily.        . fluticasone (  FLOVENT HFA) 220 MCG/ACT inhaler Inhale 1 puff into the lungs 2 (two) times daily as needed (for shortness of breath).       . folic acid (FOLVITE) 1 MG tablet Take 1 tablet (1 mg total) by mouth daily.      Marland Kitchen gabapentin (NEURONTIN) 300 MG capsule Take 1 capsule (300 mg total) by mouth daily.      . hydrALAZINE (APRESOLINE) 25 MG tablet Take 1 tablet (25 mg total) by mouth 3 (three) times daily.  90 tablet  3  . insulin glargine (LANTUS) 100 UNIT/ML injection Inject 10 Units into the skin at bedtime.  10 mL  1  . lactulose (CHRONULAC) 10 GM/15ML solution Take 45 mLs (30 g total) by mouth daily.  240 mL    . levothyroxine (SYNTHROID, LEVOTHROID) 150 MCG tablet Take 150 mcg by mouth daily.       . Multiple Vitamin (MULTIVITAMIN) capsule Take 1 capsule by mouth daily.      Marland Kitchen omeprazole (PRILOSEC) 20 MG capsule Take 20 mg by mouth  daily.        . Oxycodone HCl 10 MG TABS Take 1 tablet (10 mg total) by mouth every 6 (six) hours as needed.  120 tablet  0  . senna (SENOKOT) 8.6 MG tablet Take 2 tablets by mouth daily as needed for constipation.       . simvastatin (ZOCOR) 40 MG tablet Take 40 mg by mouth at bedtime.        Marland Kitchen spironolactone (ALDACTONE) 25 MG tablet Take 1 tablet (25 mg total) by mouth daily.  30 tablet  6  . torsemide (DEMADEX) 20 MG tablet Take 80 mg (4 tablets) in the am and 40 mg (2 tabs) in PM       No current facility-administered medications for this encounter.   Filed Vitals:   08/16/13 1318  BP: 122/64  Pulse: 72  Weight: 246 lb (111.585 kg)  SpO2: 91%    PHYSICAL EXAM: General:  Chronically ill appearing. No resp difficulty in wheelchair; daughter present HEENT: normal Neck: supple. JVP appears ~10  Carotids 2+ bilaterally; no bruits. No lymphadenopathy or thryomegaly appreciated. Cor: PMI normal. Regular rate & rhythm. No rubs, gallops. 2/6 SEM RUSB  Lungs: CTA Abdomen: obese, soft, nontender, nondistended. No hepatosplenomegaly. No bruits or masses. Good bowel sounds. Extremities: no cyanosis, clubbing, rash, R/LLE 2-3+ woody edema Neuro: alert & orientedx3, cranial nerves grossly intact. Moves all 4 extremities w/o difficulty. Affect pleasant.  ASSESSMENT & PLAN:  1) Chronic diastolic HF, EF 55-60% (08/2012) - NYHA II-III symptoms and volume status remains elevated. He is about 15 pounds above his baseline weight of 230 lbs. JVP 10 with bilateral woody edema. Have asked the patient to increase his torsemide to 80 mg BID along with a dose of 2.5 mg of metolazone today. Instructed to call if his weight is not coming down or he starts gaining more.  - Will check BMET today to assess whether he will need K+ supplementation and then check again next week with [facility].  -Reinforced need for daily weights and reviewed use of sliding scale diuretics, low salt food choices, and limiting fluid  intake < 2 liters per day. [facility] to check BMET next week and continue services  2) A-Fib - Stable, rate controlled. continue ASA and BB - Abstaining from ETOH can consider trying NOAC next visit, but concerned with possible varices 3) HTN -SBP 120's, controlled - Continue hydralazine, coreg, and spiro 4) CAD -  no s/s of ischemia will continue ASA, BB and statin 5) Gout -Pt is complaining of multiple joints with pain. Describes pain as the same as past gout flare. Will give 40 mg x3days of prednisone.   Follow up 3 weeks to reassess volume status.  Ulla Potash B NP-C 1:26 PM

## 2013-08-23 ENCOUNTER — Encounter: Payer: Self-pay | Admitting: Internal Medicine

## 2013-09-05 ENCOUNTER — Telehealth (HOSPITAL_COMMUNITY): Payer: Self-pay | Admitting: Cardiology

## 2013-09-05 ENCOUNTER — Other Ambulatory Visit (HOSPITAL_COMMUNITY): Payer: Self-pay | Admitting: Cardiology

## 2013-09-05 DIAGNOSIS — I5032 Chronic diastolic (congestive) heart failure: Secondary | ICD-10-CM

## 2013-09-05 MED ORDER — ACETAZOLAMIDE 125 MG PO TABS: 250.0000 mg | ORAL_TABLET | Freq: Two times a day (BID) | ORAL | Status: DC

## 2013-09-05 NOTE — Telephone Encounter (Signed)
PTS DAUGHTER CALLED WITH CONCERNS OF INCREASED WEIGHT (UP ABOUT 7 LBS) PT HAS INCREASED SOB LAYING ON TWO PILLOWS, INCREASED FATIGUE SEVERE LE EDEMA ?CELLULITIS   PER HEATHER SCHUB, RN PT CAN TAKE DIAMOX 250 MG BID X 3 DAYS AND IF PT DOES NOT FEEL BETTER OR HE FEELS THE FLUID IS NOT MOVING HE CAN REPORT TO THE ER FOR FURTHER EVALUATION

## 2013-09-12 ENCOUNTER — Encounter (HOSPITAL_COMMUNITY): Payer: Self-pay

## 2013-09-12 ENCOUNTER — Ambulatory Visit (HOSPITAL_COMMUNITY)
Admission: RE | Admit: 2013-09-12 | Discharge: 2013-09-12 | Disposition: A | Discharge status: Home / Self Care | Payer: Medicare Other | Source: Ambulatory Visit | Attending: Internal Medicine | Admitting: Internal Medicine

## 2013-09-12 VITALS — BP 126/74 | HR 66 | Wt 250.0 lb

## 2013-09-12 DIAGNOSIS — I4891 Unspecified atrial fibrillation: Secondary | ICD-10-CM | POA: Insufficient documentation

## 2013-09-12 DIAGNOSIS — R609 Edema, unspecified: Secondary | ICD-10-CM

## 2013-09-12 DIAGNOSIS — I2510 Atherosclerotic heart disease of native coronary artery without angina pectoris: Secondary | ICD-10-CM | POA: Insufficient documentation

## 2013-09-12 DIAGNOSIS — I5032 Chronic diastolic (congestive) heart failure: Secondary | ICD-10-CM

## 2013-09-12 DIAGNOSIS — J449 Chronic obstructive pulmonary disease, unspecified: Secondary | ICD-10-CM | POA: Insufficient documentation

## 2013-09-12 DIAGNOSIS — I10 Essential (primary) hypertension: Secondary | ICD-10-CM | POA: Insufficient documentation

## 2013-09-12 DIAGNOSIS — J4489 Other specified chronic obstructive pulmonary disease: Secondary | ICD-10-CM | POA: Insufficient documentation

## 2013-09-12 DIAGNOSIS — I6529 Occlusion and stenosis of unspecified carotid artery: Secondary | ICD-10-CM | POA: Insufficient documentation

## 2013-09-12 DIAGNOSIS — I5033 Acute on chronic diastolic (congestive) heart failure: Secondary | ICD-10-CM

## 2013-09-12 DIAGNOSIS — E039 Hypothyroidism, unspecified: Secondary | ICD-10-CM | POA: Insufficient documentation

## 2013-09-12 DIAGNOSIS — F1010 Alcohol abuse, uncomplicated: Secondary | ICD-10-CM | POA: Insufficient documentation

## 2013-09-12 DIAGNOSIS — G4733 Obstructive sleep apnea (adult) (pediatric): Secondary | ICD-10-CM

## 2013-09-12 DIAGNOSIS — E119 Type 2 diabetes mellitus without complications: Secondary | ICD-10-CM | POA: Insufficient documentation

## 2013-09-12 NOTE — Progress Notes (Signed)
Patient ID: [name], male   DOB: 1946/04/02, 67 y.o.   MRN: [id number] [name] - PCP at [facility]  Weight Range   270 pounds  Baseline proBNP   4000 09/21/12    HPI: [name] is a 67 yo male with PMX s/f CAD (s/p CABG x 4 in 2001, normal Myoview 2010), chronic diastolic CHF, persistent a-fib, DM, HTN, HL, carotid artery disease, COPD, hypothyroidism, EtOH abuse, chronic venous insufficiency and morbid obesity.  He is not anticoagulants due to ETOH abuse and fall risk.   Echo 09/2011: LVEF 45-50%, mod biatral enlargement, moderate RV dilatation, moderate TR, PASP 66 mmHg.  ECHO 08/2012 EF 55-60% moderate bilateral RA/LA enlargement  Admitted to [facility] 7/5 through 04/27/13 with massive volume overload. Sluggish diuresis with intermittent lasix and he was transitioned to Lasix drip and dopamine drip at 3 mcg per hour. Diuresis was sluggish until diamox added with marked improvement. Later switched to torsemide 80 mg in am and 40 mg in pm.  Diuresed over 40 pounds. Discharge weight was 230 pounds. Discharged to [facility]   04/30/13 Creatinine 1.46 Potassium 4.0 05/28/13 Creatinine 1.19 Potassium 4.3 07/17/13 Creatinine 1.2, K+ 4.7  Follow up: Last visit increased torsemide to 80 mg BID and received a dose of 2.5 mg metolazone. Called last week with 7lb weight gain and prescription for diamox 250 mg BID x 3 days. Weight is now down 3 lbs, however still about 15-20 lbs from discharge. Weight at home 245-248 lbs. Feeling ok. Denies SOB or CP. + orthopnea. Can walk from garage to clinic with no SOB. + LLE edema. Hasn't drank ETOH in 2 weeks. Eating lots of food with salt. Drinking less than 2L a day. Still followed by [facility].   ROS: All systems negative except as listed in HPI, PMH and Problem List.  Past Medical History  Diagnosis Date  . CHRONIC OBSTRUCTIVE PULMONARY DISEASE 06/20/2009  . OBSTRUCTIVE SLEEP APNEA 06/20/2009  . CAROTID STENOSIS 06/20/2009    A. 08/2001 s/p L  CEA;  B.   09/14/11 - Carotid U/S - 40-59% bilateral stenosis, left CEA patch angioplasty is patent  . DM 06/20/2009  . CAD 06/20/2009    A.  08/2000 - s/p CABG x 4 - LIMA-LAD, Left Radial-OM, VG-DIAG, VG-RCA;  B. Neg. MV  2010  . HYPERLIPIDEMIA 06/20/2009  . HYPERTENSION 06/20/2009  . Hypothyroidism   . Low back pain   . Asthma     as child  . Pneumonia   . Atrial fibrillation     Not felt to be coumadin candidate 2/2 ETOH use.  Marland Kitchen ETOH abuse   . History of tobacco abuse     remote - quit 1970  . Bilateral renal cysts   . Marijuana abuse   . Morbidly obese   . CHF (congestive heart failure)   . Falls frequently     Current Outpatient Prescriptions  Medication Sig Dispense Refill  . aspirin EC 325 MG tablet Take 325 mg by mouth daily.      . carvedilol (COREG) 3.125 MG tablet TAKE 1 TABLET BY MOUTH TWICE DAILY WITH MEAL. TAKE WITH 6.25MG  TO TOTAL 9.375MG .  60 tablet  1  . carvedilol (COREG) 6.25 MG tablet Take 6.25 mg by mouth 2 (two) times daily with a meal. Take along with 3.125 mg tabs to equal 9.375 mg      . colchicine 0.6 MG tablet Take 0.6 mg by mouth daily.      Marland Kitchen  FLUoxetine (PROZAC) 20 MG capsule Take 20 mg by mouth daily.        . fluticasone (FLOVENT HFA) 220 MCG/ACT inhaler Inhale 1 puff into the lungs 2 (two) times daily as needed (for shortness of breath).       . folic acid (FOLVITE) 1 MG tablet Take 1 tablet (1 mg total) by mouth daily.      Marland Kitchen gabapentin (NEURONTIN) 300 MG capsule Take 1 capsule (300 mg total) by mouth daily.      . hydrALAZINE (APRESOLINE) 25 MG tablet Take 1 tablet (25 mg total) by mouth 3 (three) times daily.  90 tablet  3  . insulin glargine (LANTUS) 100 UNIT/ML injection Inject 10 Units into the skin at bedtime.  10 mL  1  . lactulose (CHRONULAC) 10 GM/15ML solution Take 45 mLs (30 g total) by mouth daily.  240 mL    . levothyroxine (SYNTHROID, LEVOTHROID) 150 MCG tablet Take 150 mcg by mouth daily.       . Multiple Vitamin (MULTIVITAMIN) capsule  Take 1 capsule by mouth daily.      Marland Kitchen omeprazole (PRILOSEC) 20 MG capsule Take 20 mg by mouth daily.        . Oxycodone HCl 10 MG TABS Take 1 tablet (10 mg total) by mouth every 6 (six) hours as needed.  120 tablet  0  . senna (SENOKOT) 8.6 MG tablet Take 2 tablets by mouth daily as needed for constipation.       . simvastatin (ZOCOR) 40 MG tablet Take 40 mg by mouth at bedtime.        Marland Kitchen spironolactone (ALDACTONE) 25 MG tablet Take 1 tablet (25 mg total) by mouth daily.  30 tablet  6  . torsemide (DEMADEX) 20 MG tablet Take 80 mg (4 tablets) in the am and 80 mg (4 tabs) in PM  240 tablet  3   No current facility-administered medications for this encounter.   Filed Vitals:   09/12/13 1408  BP: 126/74  Pulse: 66  Weight: 250 lb (113.399 kg)  SpO2: 99%    PHYSICAL EXAM: General:  Chronically ill appearing. No resp difficulty in wheelchair; daughter present HEENT: normal Neck: supple. JVP appears ~10-12  Carotids 2+ bilaterally; no bruits. No lymphadenopathy or thryomegaly appreciated. Cor: PMI normal. Regular rate & irregular rhythm. No rubs, gallops. 2/6 SEM RUSB  Lungs: CTA Abdomen: obese, soft, nontender, mildly distended. No hepatosplenomegaly. No bruits or masses. Good bowel sounds. Extremities: no cyanosis, clubbing, rash, R/LLE 2-3+ woody edema to knees Neuro: alert & orientedx3, cranial nerves grossly intact. Moves all 4 extremities w/o difficulty. Affect pleasant.  ASSESSMENT & PLAN:  1) Chronic diastolic HF, EF 55-60% (08/2012) - NYHA II-III symptoms and volume status remains elevated. He had 3 days of diamox last week which did not seem to help much. His weight is about 15-20 lbs above baseline. Will give metolazone 5 mg x 3 days. Check BMET today. Lengthy discussion again with patient about abstaining from ETOH, fluid intake and salt intake. - Patient no longer has HH d/t not being home bound, will see it can be reordered.   2) A-Fib - Stable, rate controlled. continue ASA  and BB - Abstaining from ETOH can consider trying NOAC next visit, but concerned with possible varices 3) HTN -SBP 120's, controlled - Continue hydralazine, coreg, and spiro 4) CAD - no s/s of ischemia will continue ASA, BB and statin   Follow up 3 weeks to reassess volume status.  Elsie Ra,  Karie Mainland B NP-C 2:15 PM

## 2013-09-12 NOTE — Patient Instructions (Addendum)
Will see if you qualify for Home Health Services.  Take metolazone 5 mg (1 tablet) for 3 days, check labs next week.  Follow up 3 weeks.  Do the following things EVERYDAY: 1) Weigh yourself in the morning before breakfast. Write it down and keep it in a log. 2) Take your medicines as prescribed 3) Eat low salt foods-Limit salt (sodium) to 2000 mg per day.  4) Stay as active as you can everyday 5) Limit all fluids for the day to less than 2 liters 6)

## 2013-09-14 ENCOUNTER — Other Ambulatory Visit (HOSPITAL_COMMUNITY): Payer: Self-pay | Admitting: Cardiovascular Disease

## 2013-09-14 ENCOUNTER — Other Ambulatory Visit (HOSPITAL_COMMUNITY): Payer: Self-pay | Admitting: Internal Medicine

## 2013-09-14 DIAGNOSIS — I5022 Chronic systolic (congestive) heart failure: Secondary | ICD-10-CM

## 2013-09-14 MED ORDER — CARVEDILOL 3.125 MG PO TABS: 3.1250 mg | ORAL_TABLET | Freq: Two times a day (BID) | ORAL | Status: DC

## 2013-09-14 NOTE — Telephone Encounter (Signed)
Pt daughter called to request refill on meds

## 2013-09-25 ENCOUNTER — Ambulatory Visit (HOSPITAL_COMMUNITY)
Admission: RE | Admit: 2013-09-25 | Discharge: 2013-09-25 | Disposition: A | Discharge status: Home / Self Care | Payer: Medicare Other | Source: Ambulatory Visit | Attending: Internal Medicine | Admitting: Internal Medicine

## 2013-09-25 VITALS — BP 136/80 | HR 69 | Wt 242.0 lb

## 2013-09-25 DIAGNOSIS — I4891 Unspecified atrial fibrillation: Secondary | ICD-10-CM

## 2013-09-25 DIAGNOSIS — F1010 Alcohol abuse, uncomplicated: Secondary | ICD-10-CM | POA: Insufficient documentation

## 2013-09-25 DIAGNOSIS — I5032 Chronic diastolic (congestive) heart failure: Secondary | ICD-10-CM | POA: Insufficient documentation

## 2013-09-25 LAB — BASIC METABOLIC PANEL
CO2: 35 mEq/L — ABNORMAL HIGH (ref 19–32)
Chloride: 91 mEq/L — ABNORMAL LOW (ref 96–112)
Creatinine, Ser: 1.23 mg/dL (ref 0.50–1.35)
Sodium: 136 mEq/L (ref 135–145)

## 2013-09-25 MED ORDER — METOLAZONE 2.5 MG PO TABS: 2.5000 mg | ORAL_TABLET | ORAL | Status: DC

## 2013-09-25 NOTE — Patient Instructions (Signed)
Follow up in 2 months  Take Metolazone 2.5 mg every Monday  Do the following things EVERYDAY: 1) Weigh yourself in the morning before breakfast. Write it down and keep it in a log. 2) Take your medicines as prescribed 3) Eat low salt foods-Limit salt (sodium) to 2000 mg per day.  4) Stay as active as you can everyday 5) Limit all fluids for the day to less than 2 liters

## 2013-09-25 NOTE — Progress Notes (Signed)
Patient ID: [name], male   DOB: 01/05/1946, 67 y.o.   MRN: [id number] [name] - PCP at [facility]  Weight Range   270 pounds  Baseline proBNP   4000 09/21/12    HPI: [name] is a 67 yo male with PMX s/f CAD (s/p CABG x 4 in 2001, normal Myoview 2010), chronic diastolic CHF, persistent a-fib, DM, HTN, HL, carotid artery disease, COPD, hypothyroidism, EtOH abuse, chronic venous insufficiency and morbid obesity.  He is not anticoagulants due to ETOH abuse and fall risk.   Echo 09/2011: LVEF 45-50%, mod biatral enlargement, moderate RV dilatation, moderate TR, PASP 66 mmHg.  ECHO 08/2012 EF 55-60% moderate bilateral RA/LA enlargement  Admitted to [facility] 7/5 through 04/27/13 with massive volume overload. Sluggish diuresis with intermittent lasix and he was transitioned to Lasix drip and dopamine drip at 3 mcg per hour. Diuresis was sluggish until diamox added with marked improvement. Later switched to torsemide 80 mg in am and 40 mg in pm.  Diuresed over 40 pounds. Discharge weight was 230 pounds. Discharged to [name] .  04/14/13 Pro BNP 6847 04/30/13 Creatinine 1.46 Potassium 4.0 05/28/13 Creatinine 1.19 Potassium 4.3 07/17/13 Creatinine 1.2, K+ 4.7 08/16/13 Creatinine 1.4 K 3.9 Pro BNP 6560   He returns for follow up. Last visit he was given Metolazone for 3 days and continued on torsemide 80 mg twice a day. Mild dyspnea with exertion. Weight at home trending down from 248 to 238 pounds. Taking all medications. Wears oxygen at night only. . Not drinking alcohol.   ROS: All systems negative except as listed in HPI, PMH and Problem List.  Past Medical History  Diagnosis Date  . CHRONIC OBSTRUCTIVE PULMONARY DISEASE 06/20/2009  . OBSTRUCTIVE SLEEP APNEA 06/20/2009  . CAROTID STENOSIS 06/20/2009    A. 08/2001 s/p L CEA;  B.   09/14/11 - Carotid U/S - 40-59% bilateral stenosis, left CEA patch angioplasty is patent  . DM 06/20/2009  . CAD 06/20/2009    A.  08/2000 - s/p CABG x  4 - LIMA-LAD, Left Radial-OM, VG-DIAG, VG-RCA;  B. Neg. MV  2010  . HYPERLIPIDEMIA 06/20/2009  . HYPERTENSION 06/20/2009  . Hypothyroidism   . Low back pain   . Asthma     as child  . Pneumonia   . Atrial fibrillation     Not felt to be coumadin candidate 2/2 ETOH use.  Marland Kitchen ETOH abuse   . History of tobacco abuse     remote - quit 1970  . Bilateral renal cysts   . Marijuana abuse   . Morbidly obese   . CHF (congestive heart failure)   . Falls frequently     Current Outpatient Prescriptions  Medication Sig Dispense Refill  . aspirin EC 325 MG tablet Take 325 mg by mouth daily.      . carvedilol (COREG) 3.125 MG tablet Take 1 tablet (3.125 mg total) by mouth 2 (two) times daily with a meal. To be taken with 6.25mg  bid TO EQUAL 9.375MG   60 tablet  6  . carvedilol (COREG) 6.25 MG tablet Take 6.25 mg by mouth 2 (two) times daily with a meal. Take along with 3.125 mg tabs to equal 9.375 mg      . colchicine 0.6 MG tablet Take 0.6 mg by mouth daily.      Marland Kitchen FLUoxetine (PROZAC) 20 MG capsule Take 20 mg by mouth daily.        . fluticasone (FLOVENT HFA) 220  MCG/ACT inhaler Inhale 1 puff into the lungs 2 (two) times daily as needed (for shortness of breath).       . folic acid (FOLVITE) 1 MG tablet Take 1 tablet (1 mg total) by mouth daily.      Marland Kitchen gabapentin (NEURONTIN) 300 MG capsule Take 1 capsule (300 mg total) by mouth daily.      . hydrALAZINE (APRESOLINE) 25 MG tablet Take 1 tablet (25 mg total) by mouth 3 (three) times daily.  90 tablet  3  . insulin glargine (LANTUS) 100 UNIT/ML injection Inject 10 Units into the skin at bedtime.  10 mL  1  . lactulose (CHRONULAC) 10 GM/15ML solution Take 45 mLs (30 g total) by mouth daily.  240 mL    . levothyroxine (SYNTHROID, LEVOTHROID) 150 MCG tablet Take 150 mcg by mouth daily.       . Multiple Vitamin (MULTIVITAMIN) capsule Take 1 capsule by mouth daily.      Marland Kitchen omeprazole (PRILOSEC) 20 MG capsule Take 20 mg by mouth daily.        . Oxycodone HCl  10 MG TABS Take 1 tablet (10 mg total) by mouth every 6 (six) hours as needed.  120 tablet  0  . senna (SENOKOT) 8.6 MG tablet Take 2 tablets by mouth daily as needed for constipation.       . simvastatin (ZOCOR) 40 MG tablet Take 40 mg by mouth at bedtime.        Marland Kitchen spironolactone (ALDACTONE) 25 MG tablet Take 1 tablet (25 mg total) by mouth daily.  30 tablet  6  . torsemide (DEMADEX) 20 MG tablet Take 80 mg (4 tablets) in the am and 80 mg (4 tabs) in PM  240 tablet  3   No current facility-administered medications for this encounter.   Filed Vitals:   09/25/13 1203  BP: 136/80  Pulse: 69  Weight: 242 lb (109.77 kg)  SpO2: 89%    PHYSICAL EXAM: General:  Chronically ill appearing. No resp difficulty in wheelchair; daughter present HEENT: normal Neck: supple. JVP appears ~8-9  Carotids 2+ bilaterally; no bruits. No lymphadenopathy or thryomegaly appreciated. Cor: PMI normal. Irregular rate & irregular rhythm. No rubs, gallops. 2/6 SEM RUSB  Lungs: CTA Abdomen: obese, soft, nontender, nondistended. No hepatosplenomegaly. No bruits or masses. Good bowel sounds. Extremities: no cyanosis, clubbing, rash, R/LLE 1+ edema Neuro: alert & orientedx3, cranial nerves grossly intact. Moves all 4 extremities w/o difficulty. Affect pleasant.  ASSESSMENT & PLAN:  1) Chronic diastolic HF, EF 55-60% (08/2012) - NYHA II-III symptoms. Volume status mildly elevated but much better. Weight at home down to 239 pounds. Continue torsemide 80 mg twice a day and add metolazone once a week.  Check BMET today and in 2 weeks. Will get his PCP to check BMET in 2 weeks and fax results.  Reinforced daily weights, low salt food choices,and limiting fluid intake to < 2 liters per day.  -2) A-Fib - Stable, rate controlled. continue ASA and BB - Abstaining from ETOH can consider trying NOAC next visit, but concerned with possible varices 3) HTNControlled-- Continue hydralazine, coreg, and spiro 4) CAD- no s/s of  ischemia will continue ASA, BB and statin 5) ETOH abuse- Congratulated him on abstaining from alcohol for the last few weeks.    Follow up in 2 months   CLEGG,AMY NP-C 12:07 PM   Patient seen and examined with Tonye Becket, NP. We discussed all aspects of the encounter. I agree with the assessment  and plan as stated above.   Overall, volume status much improved with metolazone and him abstaining from ETOH. Have encouraged him to stay off ETOH. Will have him take metolazone 1x/week. Check labs today and in 2 weeks. RTC in 2 months.   Daniel Bensimhon,MD 1:21 PM

## 2013-10-26 ENCOUNTER — Encounter: Payer: Self-pay | Admitting: Internal Medicine

## 2013-11-29 ENCOUNTER — Encounter (HOSPITAL_COMMUNITY): Payer: Medicare Other

## 2013-12-25 ENCOUNTER — Encounter (HOSPITAL_COMMUNITY): Payer: Medicare Other

## 2014-01-02 ENCOUNTER — Encounter (HOSPITAL_COMMUNITY): Payer: Medicare Other

## 2014-01-17 ENCOUNTER — Other Ambulatory Visit: Payer: Self-pay | Admitting: *Deleted

## 2014-01-17 ENCOUNTER — Ambulatory Visit (HOSPITAL_COMMUNITY)
Admission: RE | Admit: 2014-01-17 | Discharge: 2014-01-17 | Disposition: A | Discharge status: Home / Self Care | Payer: Medicare Other | Source: Ambulatory Visit | Attending: Internal Medicine | Admitting: Internal Medicine

## 2014-01-17 VITALS — BP 112/58 | HR 73 | Wt 239.5 lb

## 2014-01-17 DIAGNOSIS — I509 Heart failure, unspecified: Secondary | ICD-10-CM | POA: Insufficient documentation

## 2014-01-17 DIAGNOSIS — I129 Hypertensive chronic kidney disease with stage 1 through stage 4 chronic kidney disease, or unspecified chronic kidney disease: Secondary | ICD-10-CM | POA: Insufficient documentation

## 2014-01-17 DIAGNOSIS — I6529 Occlusion and stenosis of unspecified carotid artery: Secondary | ICD-10-CM

## 2014-01-17 DIAGNOSIS — I5033 Acute on chronic diastolic (congestive) heart failure: Secondary | ICD-10-CM

## 2014-01-17 DIAGNOSIS — Z951 Presence of aortocoronary bypass graft: Secondary | ICD-10-CM | POA: Insufficient documentation

## 2014-01-17 DIAGNOSIS — E785 Hyperlipidemia, unspecified: Secondary | ICD-10-CM

## 2014-01-17 DIAGNOSIS — I10 Essential (primary) hypertension: Secondary | ICD-10-CM

## 2014-01-17 DIAGNOSIS — I2510 Atherosclerotic heart disease of native coronary artery without angina pectoris: Secondary | ICD-10-CM

## 2014-01-17 DIAGNOSIS — I4891 Unspecified atrial fibrillation: Secondary | ICD-10-CM

## 2014-01-17 DIAGNOSIS — N189 Chronic kidney disease, unspecified: Secondary | ICD-10-CM | POA: Insufficient documentation

## 2014-01-17 DIAGNOSIS — I5032 Chronic diastolic (congestive) heart failure: Secondary | ICD-10-CM

## 2014-01-17 LAB — CBC
HCT: 35.9 % — ABNORMAL LOW (ref 39.0–52.0)
Hemoglobin: 11.7 g/dL — ABNORMAL LOW (ref 13.0–17.0)
MCH: 29 pg (ref 26.0–34.0)
MCHC: 32.6 g/dL (ref 30.0–36.0)
MCV: 88.9 fL (ref 78.0–100.0)
PLATELETS: 159 10*3/uL (ref 150–400)
RBC: 4.04 MIL/uL — ABNORMAL LOW (ref 4.22–5.81)
RDW: 17 % — AB (ref 11.5–15.5)
WBC: 8 10*3/uL (ref 4.0–10.5)

## 2014-01-17 LAB — BASIC METABOLIC PANEL
BUN: 78 mg/dL — AB (ref 6–23)
CALCIUM: 9.8 mg/dL (ref 8.4–10.5)
CHLORIDE: 88 meq/L — AB (ref 96–112)
CO2: 31 mEq/L (ref 19–32)
CREATININE: 1.2 mg/dL (ref 0.50–1.35)
GFR, EST AFRICAN AMERICAN: 69 mL/min — AB (ref >90)
GFR, EST NON AFRICAN AMERICAN: 60 mL/min — AB (ref >90)
Glucose, Bld: 198 mg/dL — ABNORMAL HIGH (ref 70–99)
Potassium: 3.7 mEq/L (ref 3.7–5.3)
Sodium: 137 mEq/L (ref 137–147)

## 2014-01-17 LAB — PRO B NATRIURETIC PEPTIDE: PRO B NATRI PEPTIDE: 4873 pg/mL — AB (ref 0–125)

## 2014-01-17 NOTE — Patient Instructions (Signed)
Take Metolazone tomorrow, then continue every Monday  PLEASE TAKE YOUR EVENING DOSE OF TORSEMIDE DAILY  Labs today  Your physician recommends that you schedule a follow-up appointment in: 3 weeks  You are scheduled to see Dr Angelena Form on

## 2014-01-19 NOTE — Progress Notes (Signed)
Patient ID: [name], male   DOB: 03/09/1946, 68 y.o.   MRN: [id number] Dr. Noberto Retort - PCP at [facility]  Weight Range   270 pounds  Baseline proBNP   4000 09/21/12    HPI: [name] is a 68 yo male with PMX s/f CAD (s/p CABG x 4 in 2001, normal Myoview 2010), chronic diastolic CHF, chronic a-fib, DM, HTN, HL, carotid artery disease, COPD, hypothyroidism, EtOH abuse, chronic venous insufficiency and morbid obesity.  He has not been on anticoagulants due to ETOH abuse and fall risk.   Echo 09/2011: LVEF 45-50%, mod biatral enlargement, moderate RV dilatation, moderate TR, PASP 66 mmHg.  ECHO 08/2012 EF 55-60% moderate bilateral RA/LA enlargement  Admitted to [facility] 7/5 through 04/27/13 with massive volume overload. Sluggish diuresis with intermittent lasix, and he was transitioned to Lasix drip and dopamine drip at 3 mcg/kg/hr. Diuresis was sluggish until diamox added with marked improvement. Later switched to torsemide 80 mg in am and 40 mg in pm.  Diuresed over 40 pounds. Discharge weight was 230 pounds. Discharged to [facility]   04/30/13 Creatinine 1.46 Potassium 4.0 05/28/13 Creatinine 1.19 Potassium 4.3 07/17/13 Creatinine 1.2, K+ 4.7 1/15 creatinine 1.6, K 3.5, LFTs normal  Follow up: Patient walks with a walker.  He does ok on flat ground but is short of breath going up steps.  He has been developing more lower extremity edema over the last couple of weeks.  He has been forgetting his afternoon torsemide on most days.  He takes metolazone once a week.  He drinks 1 alcoholic drink/day.  He has not had any recent falls.  He has had orthopnea for about 2 wks.  No chest pain.    ROS: All systems negative except as listed in HPI, PMH and Problem List.  Past Medical History  Diagnosis Date  . CHRONIC OBSTRUCTIVE PULMONARY DISEASE 06/20/2009  . OBSTRUCTIVE SLEEP APNEA 06/20/2009  . CAROTID STENOSIS 06/20/2009    A. 08/2001 s/p L CEA;  B.   09/14/11 - Carotid U/S - 40-59%  bilateral stenosis, left CEA patch angioplasty is patent  . DM 06/20/2009  . CAD 06/20/2009    A.  08/2000 - s/p CABG x 4 - LIMA-LAD, Left Radial-OM, VG-DIAG, VG-RCA;  B. Neg. MV  2010  . HYPERLIPIDEMIA 06/20/2009  . HYPERTENSION 06/20/2009  . Hypothyroidism   . Low back pain   . Asthma     as child  . Pneumonia   . Atrial fibrillation     Not felt to be coumadin candidate 2/2 ETOH use.  Marland Kitchen ETOH abuse   . History of tobacco abuse     remote - quit 1970  . Bilateral renal cysts   . Marijuana abuse   . Morbidly obese   . CHF (congestive heart failure)   . Falls frequently     Current Outpatient Prescriptions  Medication Sig Dispense Refill  . aspirin EC 325 MG tablet Take 325 mg by mouth daily.      . carvedilol (COREG) 3.125 MG tablet Take 1 tablet (3.125 mg total) by mouth 2 (two) times daily with a meal. To be taken with 6.25mg  bid TO EQUAL 9.375MG   60 tablet  6  . carvedilol (COREG) 6.25 MG tablet Take 6.25 mg by mouth 2 (two) times daily with a meal. Take along with 3.125 mg tabs to equal 9.375 mg      . colchicine 0.6 MG tablet Take 0.6 mg by mouth daily.      Marland Kitchen  FLUoxetine (PROZAC) 20 MG capsule Take 20 mg by mouth daily.        . fluticasone (FLOVENT HFA) 220 MCG/ACT inhaler Inhale 1 puff into the lungs 2 (two) times daily as needed (for shortness of breath).       . folic acid (FOLVITE) 1 MG tablet Take 1 tablet (1 mg total) by mouth daily.      Marland Kitchen gabapentin (NEURONTIN) 300 MG capsule Take 1 capsule (300 mg total) by mouth daily.      . hydrALAZINE (APRESOLINE) 25 MG tablet Take 1 tablet (25 mg total) by mouth 3 (three) times daily.  90 tablet  3  . insulin glargine (LANTUS) 100 UNIT/ML injection Inject 10 Units into the skin at bedtime.  10 mL  1  . lactulose (CHRONULAC) 10 GM/15ML solution Take 45 mLs (30 g total) by mouth daily.  240 mL    . levothyroxine (SYNTHROID, LEVOTHROID) 150 MCG tablet Take 150 mcg by mouth daily.       . metolazone (ZAROXOLYN) 2.5 MG tablet Take 2.5  mg by mouth once a week. Every Monday      . Multiple Vitamin (MULTIVITAMIN) capsule Take 1 capsule by mouth daily.      Marland Kitchen omeprazole (PRILOSEC) 20 MG capsule Take 20 mg by mouth daily.        . Oxycodone HCl 10 MG TABS Take 1 tablet (10 mg total) by mouth every 6 (six) hours as needed.  120 tablet  0  . senna (SENOKOT) 8.6 MG tablet Take 2 tablets by mouth daily as needed for constipation.       . simvastatin (ZOCOR) 40 MG tablet Take 40 mg by mouth at bedtime.        Marland Kitchen spironolactone (ALDACTONE) 25 MG tablet Take 1 tablet (25 mg total) by mouth daily.  30 tablet  6  . torsemide (DEMADEX) 20 MG tablet Take 80 mg (4 tablets) in the am and 80 mg (4 tabs) in PM  240 tablet  3   No current facility-administered medications for this encounter.   Filed Vitals:   01/17/14 1514  BP: 112/58  Pulse: 73  Weight: 239 lb 8 oz (108.636 kg)  SpO2: 95%    PHYSICAL EXAM: General:  Chronically ill appearing. No resp difficulty in wheelchair; daughter present HEENT: normal Neck: supple. JVP appears ~ 9-10 cm  Carotids 2+ bilaterally; no bruits. No lymphadenopathy or thryomegaly appreciated. Cor: PMI normal. Regular rate & irregular rhythm. No rubs, gallops. 2/6 SEM RUSB  Lungs: CTA Abdomen: obese, soft, nontender, mildly distended. No hepatosplenomegaly. No bruits or masses. Good bowel sounds. Extremities: no cyanosis, clubbing, rash, 1+ edema 1/2 to knees bilaterally (L>R) Neuro: alert & orientedx3, cranial nerves grossly intact. Moves all 4 extremities w/o difficulty. Affect pleasant.  ASSESSMENT & PLAN:  1) Chronic diastolic HF: EF 03-50% (06/3817).  NYHA II-III symptoms with volume overload.  He has not been taking his pm torsemide dose regularly.   - Remember to take pm torsemide dose daily => he is going to put it in a pillbox.  - BMET/BNP today.  - He will take a dose of metolazone tomorrow.  2) A-Fib: Stable, rate controlled on Coreg. He has only 1 alcoholic drink/night and has had no recent  falls. I will check CBC and BMET today.  If these are stable, will plan to start Xarelto 15 mg daily at followup in 3 wks.  3) HTN: BP controlled.  4) CAD: No ischemic symptoms.  Continue ASA  and statin.  Will stop ASA if Xarelto started at next visit.  5) Carotid stenosis: Patient is due to carotid dopplers, will arrange.  6) CKD: BMET today.   Follow up 3 weeks to reassess volume status.  Larey Dresser 01/19/2014

## 2014-01-22 ENCOUNTER — Encounter: Payer: Self-pay | Admitting: Cardiovascular Disease

## 2014-01-28 ENCOUNTER — Encounter (HOSPITAL_COMMUNITY)
Admission: EM | Disposition: A | Discharge status: Skilled Nursing Facility | Payer: Self-pay | Source: Other Acute Inpatient Hospital | Attending: Internal Medicine

## 2014-01-28 ENCOUNTER — Encounter (HOSPITAL_COMMUNITY): Payer: Medicare Other | Admitting: Anesthesiology

## 2014-01-28 ENCOUNTER — Encounter (HOSPITAL_COMMUNITY): Payer: Self-pay | Admitting: Anesthesiology

## 2014-01-28 ENCOUNTER — Inpatient Hospital Stay (HOSPITAL_COMMUNITY)
Admission: EM | Admit: 2014-01-28 | Discharge: 2014-02-05 | DRG: 329 | Disposition: A | Discharge status: Skilled Nursing Facility | Payer: Medicare Other | Source: Other Acute Inpatient Hospital | Attending: Internal Medicine | Admitting: Internal Medicine

## 2014-01-28 ENCOUNTER — Inpatient Hospital Stay (HOSPITAL_COMMUNITY): Payer: Medicare Other | Admitting: Anesthesiology

## 2014-01-28 DIAGNOSIS — Z87891 Personal history of nicotine dependence: Secondary | ICD-10-CM

## 2014-01-28 DIAGNOSIS — E1165 Type 2 diabetes mellitus with hyperglycemia: Secondary | ICD-10-CM

## 2014-01-28 DIAGNOSIS — I4891 Unspecified atrial fibrillation: Secondary | ICD-10-CM | POA: Diagnosis present

## 2014-01-28 DIAGNOSIS — E039 Hypothyroidism, unspecified: Secondary | ICD-10-CM

## 2014-01-28 DIAGNOSIS — R531 Weakness: Secondary | ICD-10-CM

## 2014-01-28 DIAGNOSIS — R112 Nausea with vomiting, unspecified: Secondary | ICD-10-CM

## 2014-01-28 DIAGNOSIS — E1151 Type 2 diabetes mellitus with diabetic peripheral angiopathy without gangrene: Secondary | ICD-10-CM

## 2014-01-28 DIAGNOSIS — Z794 Long term (current) use of insulin: Secondary | ICD-10-CM

## 2014-01-28 DIAGNOSIS — E119 Type 2 diabetes mellitus without complications: Secondary | ICD-10-CM | POA: Diagnosis present

## 2014-01-28 DIAGNOSIS — F329 Major depressive disorder, single episode, unspecified: Secondary | ICD-10-CM | POA: Diagnosis present

## 2014-01-28 DIAGNOSIS — IMO0002 Reserved for concepts with insufficient information to code with codable children: Secondary | ICD-10-CM

## 2014-01-28 DIAGNOSIS — K420 Umbilical hernia with obstruction, without gangrene: Secondary | ICD-10-CM

## 2014-01-28 DIAGNOSIS — R5381 Other malaise: Secondary | ICD-10-CM

## 2014-01-28 DIAGNOSIS — I5032 Chronic diastolic (congestive) heart failure: Secondary | ICD-10-CM | POA: Diagnosis present

## 2014-01-28 DIAGNOSIS — I119 Hypertensive heart disease without heart failure: Secondary | ICD-10-CM | POA: Diagnosis present

## 2014-01-28 DIAGNOSIS — I10 Essential (primary) hypertension: Secondary | ICD-10-CM | POA: Diagnosis present

## 2014-01-28 DIAGNOSIS — I480 Paroxysmal atrial fibrillation: Secondary | ICD-10-CM | POA: Diagnosis present

## 2014-01-28 DIAGNOSIS — I509 Heart failure, unspecified: Secondary | ICD-10-CM | POA: Diagnosis present

## 2014-01-28 DIAGNOSIS — K56609 Unspecified intestinal obstruction, unspecified as to partial versus complete obstruction: Secondary | ICD-10-CM | POA: Diagnosis present

## 2014-01-28 DIAGNOSIS — R109 Unspecified abdominal pain: Secondary | ICD-10-CM

## 2014-01-28 DIAGNOSIS — J4489 Other specified chronic obstructive pulmonary disease: Secondary | ICD-10-CM | POA: Diagnosis present

## 2014-01-28 DIAGNOSIS — I6529 Occlusion and stenosis of unspecified carotid artery: Secondary | ICD-10-CM | POA: Diagnosis present

## 2014-01-28 DIAGNOSIS — Z951 Presence of aortocoronary bypass graft: Secondary | ICD-10-CM

## 2014-01-28 DIAGNOSIS — Z823 Family history of stroke: Secondary | ICD-10-CM

## 2014-01-28 DIAGNOSIS — N179 Acute kidney failure, unspecified: Secondary | ICD-10-CM

## 2014-01-28 DIAGNOSIS — K55059 Acute (reversible) ischemia of intestine, part and extent unspecified: Secondary | ICD-10-CM | POA: Diagnosis present

## 2014-01-28 DIAGNOSIS — I2510 Atherosclerotic heart disease of native coronary artery without angina pectoris: Secondary | ICD-10-CM | POA: Diagnosis present

## 2014-01-28 DIAGNOSIS — Z7982 Long term (current) use of aspirin: Secondary | ICD-10-CM

## 2014-01-28 DIAGNOSIS — E876 Hypokalemia: Secondary | ICD-10-CM | POA: Diagnosis present

## 2014-01-28 DIAGNOSIS — E1159 Type 2 diabetes mellitus with other circulatory complications: Secondary | ICD-10-CM

## 2014-01-28 DIAGNOSIS — J449 Chronic obstructive pulmonary disease, unspecified: Secondary | ICD-10-CM | POA: Diagnosis present

## 2014-01-28 DIAGNOSIS — Z6833 Body mass index (BMI) 33.0-33.9, adult: Secondary | ICD-10-CM

## 2014-01-28 DIAGNOSIS — K5669 Other intestinal obstruction: Principal | ICD-10-CM | POA: Diagnosis present

## 2014-01-28 DIAGNOSIS — F3289 Other specified depressive episodes: Secondary | ICD-10-CM | POA: Diagnosis present

## 2014-01-28 DIAGNOSIS — F1210 Cannabis abuse, uncomplicated: Secondary | ICD-10-CM | POA: Diagnosis present

## 2014-01-28 HISTORY — PX: LAPAROTOMY: SHX154

## 2014-01-28 HISTORY — PX: UMBILICAL HERNIA REPAIR: SHX196

## 2014-01-28 HISTORY — PX: BOWEL RESECTION: SHX1257

## 2014-01-28 LAB — COMPREHENSIVE METABOLIC PANEL
ALBUMIN: 3.4 g/dL — AB (ref 3.5–5.2)
ALK PHOS: 81 U/L (ref 39–117)
ALT: 10 U/L (ref 0–53)
AST: 17 U/L (ref 0–37)
BUN: 90 mg/dL — AB (ref 6–23)
CHLORIDE: 92 meq/L — AB (ref 96–112)
CO2: 33 mEq/L — ABNORMAL HIGH (ref 19–32)
Calcium: 10.3 mg/dL (ref 8.4–10.5)
Creatinine, Ser: 1.06 mg/dL (ref 0.50–1.35)
GFR calc Af Amer: 80 mL/min — ABNORMAL LOW (ref >90)
GFR calc non Af Amer: 69 mL/min — ABNORMAL LOW (ref >90)
Glucose, Bld: 211 mg/dL — ABNORMAL HIGH (ref 70–99)
Potassium: 3.5 mEq/L — ABNORMAL LOW (ref 3.7–5.3)
Sodium: 141 mEq/L (ref 137–147)
TOTAL PROTEIN: 8 g/dL (ref 6.0–8.3)
Total Bilirubin: 0.5 mg/dL (ref 0.3–1.2)

## 2014-01-28 LAB — MAGNESIUM: MAGNESIUM: 1.7 mg/dL (ref 1.5–2.5)

## 2014-01-28 LAB — SURGICAL PCR SCREEN
MRSA, PCR: NEGATIVE
Staphylococcus aureus: NEGATIVE

## 2014-01-28 LAB — CBC WITH DIFFERENTIAL/PLATELET
Basophils Absolute: 0 10*3/uL (ref 0.0–0.1)
Basophils Relative: 0 % (ref 0–1)
EOS PCT: 1 % (ref 0–5)
Eosinophils Absolute: 0.1 10*3/uL (ref 0.0–0.7)
HEMATOCRIT: 41 % (ref 39.0–52.0)
Hemoglobin: 13.2 g/dL (ref 13.0–17.0)
LYMPHS ABS: 0.8 10*3/uL (ref 0.7–4.0)
LYMPHS PCT: 9 % — AB (ref 12–46)
MCH: 28.6 pg (ref 26.0–34.0)
MCHC: 32.2 g/dL (ref 30.0–36.0)
MCV: 88.9 fL (ref 78.0–100.0)
Monocytes Absolute: 1 10*3/uL (ref 0.1–1.0)
Monocytes Relative: 11 % (ref 3–12)
NEUTROS ABS: 7.4 10*3/uL (ref 1.7–7.7)
Neutrophils Relative %: 79 % — ABNORMAL HIGH (ref 43–77)
PLATELETS: 164 10*3/uL (ref 150–400)
RBC: 4.61 MIL/uL (ref 4.22–5.81)
RDW: 16.5 % — ABNORMAL HIGH (ref 11.5–15.5)
WBC: 9.3 10*3/uL (ref 4.0–10.5)

## 2014-01-28 LAB — GLUCOSE, CAPILLARY
GLUCOSE-CAPILLARY: 199 mg/dL — AB (ref 70–99)
GLUCOSE-CAPILLARY: 177 mg/dL — AB (ref 70–99)
Glucose-Capillary: 182 mg/dL — ABNORMAL HIGH (ref 70–99)
Glucose-Capillary: 153 mg/dL — ABNORMAL HIGH (ref 70–99)

## 2014-01-28 LAB — PROTIME-INR
INR: 1.07 (ref 0.00–1.49)
Prothrombin Time: 13.7 seconds (ref 11.6–15.2)

## 2014-01-28 SURGERY — REPAIR, HERNIA, UMBILICAL, ADULT
Anesthesia: General | Site: Abdomen

## 2014-01-28 MED ORDER — PROPOFOL 10 MG/ML IV BOLUS
INTRAVENOUS | Status: DC | PRN
  Administered 2014-01-28: 100 mg via INTRAVENOUS

## 2014-01-28 MED ORDER — ROCURONIUM BROMIDE 100 MG/10ML IV SOLN
INTRAVENOUS | Status: DC | PRN
  Administered 2014-01-28: 25 mg via INTRAVENOUS

## 2014-01-28 MED ORDER — NEOSTIGMINE METHYLSULFATE 1 MG/ML IJ SOLN
INTRAMUSCULAR | Status: DC | PRN
Start: 2014-01-28 — End: 2014-01-28
  Administered 2014-01-28: 3 mg via INTRAVENOUS

## 2014-01-28 MED ORDER — FLUTICASONE PROPIONATE HFA 220 MCG/ACT IN AERO
1.0000 | INHALATION_SPRAY | Freq: Two times a day (BID) | RESPIRATORY_TRACT | Status: DC | PRN
  Filled 2014-01-28: qty 12

## 2014-01-28 MED ORDER — HYDROMORPHONE HCL PF 1 MG/ML IJ SOLN
0.5000 mg | INTRAMUSCULAR | Status: DC | PRN
  Administered 2014-01-29 – 2014-01-31 (×7): 0.5 mg via INTRAVENOUS
  Filled 2014-01-28 (×10): qty 1

## 2014-01-28 MED ORDER — FENTANYL CITRATE 0.05 MG/ML IJ SOLN
INTRAMUSCULAR | Status: DC | PRN
  Administered 2014-01-28: 100 ug via INTRAVENOUS
  Administered 2014-01-28: 50 ug via INTRAVENOUS
  Administered 2014-01-28: 100 ug via INTRAVENOUS

## 2014-01-28 MED ORDER — SUCCINYLCHOLINE CHLORIDE 20 MG/ML IJ SOLN
INTRAMUSCULAR | Status: DC | PRN
  Administered 2014-01-28: 80 mg via INTRAVENOUS

## 2014-01-28 MED ORDER — ONDANSETRON 8 MG/NS 50 ML IVPB
8.0000 mg | Freq: Four times a day (QID) | INTRAVENOUS | Status: DC | PRN
  Administered 2014-01-29: 8 mg via INTRAVENOUS
  Filled 2014-01-28: qty 8

## 2014-01-28 MED ORDER — HEPARIN SODIUM (PORCINE) 5000 UNIT/ML IJ SOLN
5000.0000 [IU] | Freq: Three times a day (TID) | INTRAMUSCULAR | Status: DC
  Administered 2014-01-28 – 2014-02-05 (×23): 5000 [IU] via SUBCUTANEOUS
  Filled 2014-01-28 (×29): qty 1

## 2014-01-28 MED ORDER — FLUTICASONE PROPIONATE HFA 220 MCG/ACT IN AERO
1.0000 | INHALATION_SPRAY | Freq: Two times a day (BID) | RESPIRATORY_TRACT | Status: DC
  Administered 2014-01-28 – 2014-02-05 (×14): 1 via RESPIRATORY_TRACT
  Filled 2014-01-28: qty 12

## 2014-01-28 MED ORDER — LACTATED RINGERS IV SOLN
INTRAVENOUS | Status: DC
  Administered 2014-01-28: 09:00:00 via INTRAVENOUS

## 2014-01-28 MED ORDER — LIDOCAINE HCL (CARDIAC) 20 MG/ML IV SOLN
INTRAVENOUS | Status: DC | PRN
  Administered 2014-01-28: 60 mg via INTRAVENOUS

## 2014-01-28 MED ORDER — HYDROMORPHONE HCL PF 1 MG/ML IJ SOLN
INTRAMUSCULAR | Status: AC
  Filled 2014-01-28: qty 1

## 2014-01-28 MED ORDER — HYDRALAZINE HCL 20 MG/ML IJ SOLN: 5.0000 mg | Freq: Four times a day (QID) | INTRAMUSCULAR | Status: DC | PRN

## 2014-01-28 MED ORDER — BUPIVACAINE HCL (PF) 0.25 % IJ SOLN
INTRAMUSCULAR | Status: DC | PRN
  Administered 2014-01-28: 30 mL

## 2014-01-28 MED ORDER — FENTANYL CITRATE 0.05 MG/ML IJ SOLN
INTRAMUSCULAR | Status: AC
  Filled 2014-01-28: qty 5

## 2014-01-28 MED ORDER — SODIUM CHLORIDE 0.9 % IJ SOLN
3.0000 mL | Freq: Two times a day (BID) | INTRAMUSCULAR | Status: DC
  Administered 2014-01-28 – 2014-02-05 (×13): 3 mL via INTRAVENOUS

## 2014-01-28 MED ORDER — LEVOTHYROXINE SODIUM 150 MCG PO TABS
150.0000 ug | ORAL_TABLET | Freq: Every day | ORAL | Status: DC
  Administered 2014-01-28 – 2014-02-05 (×8): 150 ug via NASOGASTRIC
  Filled 2014-01-28 (×10): qty 1

## 2014-01-28 MED ORDER — INSULIN ASPART 100 UNIT/ML ~~LOC~~ SOLN
0.0000 [IU] | Freq: Four times a day (QID) | SUBCUTANEOUS | Status: DC
  Administered 2014-01-28 – 2014-01-29 (×5): 2 [IU] via SUBCUTANEOUS

## 2014-01-28 MED ORDER — ONDANSETRON HCL 4 MG/2ML IJ SOLN: 4.0000 mg | Freq: Once | INTRAMUSCULAR | Status: DC | PRN

## 2014-01-28 MED ORDER — CARVEDILOL 3.125 MG PO TABS
3.1250 mg | ORAL_TABLET | Freq: Two times a day (BID) | ORAL | Status: DC
  Filled 2014-01-28 (×5): qty 1

## 2014-01-28 MED ORDER — PHENYLEPHRINE HCL 10 MG/ML IJ SOLN
INTRAMUSCULAR | Status: DC | PRN
  Administered 2014-01-28: 80 ug via INTRAVENOUS

## 2014-01-28 MED ORDER — BUPIVACAINE HCL (PF) 0.25 % IJ SOLN
INTRAMUSCULAR | Status: AC
  Filled 2014-01-28: qty 30

## 2014-01-28 MED ORDER — 0.9 % SODIUM CHLORIDE (POUR BTL) OPTIME
TOPICAL | Status: DC | PRN
  Administered 2014-01-28: 1000 mL

## 2014-01-28 MED ORDER — CEFAZOLIN SODIUM-DEXTROSE 2-3 GM-% IV SOLR
INTRAVENOUS | Status: DC | PRN
  Administered 2014-01-28: 2 g via INTRAVENOUS

## 2014-01-28 MED ORDER — ASPIRIN 81 MG PO CHEW
324.0000 mg | CHEWABLE_TABLET | Freq: Every day | ORAL | Status: DC
  Administered 2014-01-29: 324 mg via JEJUNOSTOMY
  Filled 2014-01-28: qty 4

## 2014-01-28 MED ORDER — INSULIN ASPART 100 UNIT/ML ~~LOC~~ SOLN: 0.0000 [IU] | Freq: Every day | SUBCUTANEOUS | Status: DC

## 2014-01-28 MED ORDER — MORPHINE SULFATE 2 MG/ML IJ SOLN
1.0000 mg | INTRAMUSCULAR | Status: DC | PRN
  Administered 2014-01-28 (×2): 4 mg via INTRAVENOUS
  Administered 2014-01-28: 2 mg via INTRAVENOUS
  Filled 2014-01-28: qty 2
  Filled 2014-01-28: qty 1
  Filled 2014-01-28: qty 2

## 2014-01-28 MED ORDER — HYDROMORPHONE HCL PF 1 MG/ML IJ SOLN
0.2500 mg | INTRAMUSCULAR | Status: DC | PRN
  Administered 2014-01-28 (×4): 0.5 mg via INTRAVENOUS

## 2014-01-28 MED ORDER — GLYCOPYRROLATE 0.2 MG/ML IJ SOLN
INTRAMUSCULAR | Status: DC | PRN
  Administered 2014-01-28: 0.4 mg via INTRAVENOUS

## 2014-01-28 MED ORDER — ONDANSETRON HCL 4 MG PO TABS
4.0000 mg | ORAL_TABLET | Freq: Four times a day (QID) | ORAL | Status: DC | PRN
  Administered 2014-01-28 – 2014-02-04 (×2): 4 mg via ORAL
  Filled 2014-01-28 (×2): qty 1

## 2014-01-28 MED ORDER — FLUOXETINE HCL 20 MG PO CAPS
20.0000 mg | ORAL_CAPSULE | Freq: Every day | ORAL | Status: DC
  Administered 2014-01-29 – 2014-02-04 (×7): 20 mg
  Filled 2014-01-28 (×9): qty 1

## 2014-01-28 MED ORDER — LACTATED RINGERS IV SOLN
INTRAVENOUS | Status: DC
  Administered 2014-01-28: 75 mL/h via INTRAVENOUS
  Administered 2014-01-29: 08:00:00 via INTRAVENOUS

## 2014-01-28 MED ORDER — CEFAZOLIN SODIUM-DEXTROSE 2-3 GM-% IV SOLR
2.0000 g | Freq: Three times a day (TID) | INTRAVENOUS | Status: AC
  Administered 2014-01-28 – 2014-01-29 (×3): 2 g via INTRAVENOUS
  Filled 2014-01-28 (×4): qty 50

## 2014-01-28 MED ORDER — MORPHINE SULFATE 2 MG/ML IJ SOLN
1.0000 mg | INTRAMUSCULAR | Status: DC | PRN
  Administered 2014-01-28 (×2): 2 mg via INTRAVENOUS
  Filled 2014-01-28 (×2): qty 1

## 2014-01-28 MED ORDER — ONDANSETRON HCL 4 MG/2ML IJ SOLN
INTRAMUSCULAR | Status: DC | PRN
  Administered 2014-01-28: 4 mg via INTRAVENOUS

## 2014-01-28 MED ORDER — PROPOFOL 10 MG/ML IV BOLUS
INTRAVENOUS | Status: AC
  Filled 2014-01-28: qty 20

## 2014-01-28 SURGICAL SUPPLY — 54 items
BENZOIN TINCTURE PRP APPL 2/3 (GAUZE/BANDAGES/DRESSINGS) ×3 IMPLANT
BLADE SURG ROTATE 9660 (MISCELLANEOUS) ×3 IMPLANT
CANISTER SUCTION 2500CC (MISCELLANEOUS) IMPLANT
CHLORAPREP W/TINT 26ML (MISCELLANEOUS) ×3 IMPLANT
CLOSURE WOUND 1/2 X4 (GAUZE/BANDAGES/DRESSINGS) ×1
COVER SURGICAL LIGHT HANDLE (MISCELLANEOUS) ×3 IMPLANT
DECANTER SPIKE VIAL GLASS SM (MISCELLANEOUS) ×3 IMPLANT
DRAPE PED LAPAROTOMY (DRAPES) ×3 IMPLANT
DRAPE UTILITY 15X26 W/TAPE STR (DRAPE) ×6 IMPLANT
DRSG COVADERM 4X8 (GAUZE/BANDAGES/DRESSINGS) ×3 IMPLANT
DRSG TEGADERM 4X4.75 (GAUZE/BANDAGES/DRESSINGS) ×6 IMPLANT
ELECT CAUTERY BLADE 6.4 (BLADE) ×3 IMPLANT
ELECT REM PT RETURN 9FT ADLT (ELECTROSURGICAL) ×3
ELECTRODE REM PT RTRN 9FT ADLT (ELECTROSURGICAL) ×1 IMPLANT
GAUZE SPONGE 2X2 8PLY STRL LF (GAUZE/BANDAGES/DRESSINGS) ×1 IMPLANT
GLOVE BIOGEL PI IND STRL 6 (GLOVE) ×1 IMPLANT
GLOVE BIOGEL PI IND STRL 7.0 (GLOVE) ×1 IMPLANT
GLOVE BIOGEL PI INDICATOR 6 (GLOVE) ×2
GLOVE BIOGEL PI INDICATOR 7.0 (GLOVE) ×2
GLOVE SURG SIGNA 7.5 PF LTX (GLOVE) ×6 IMPLANT
GLOVE SURG SS PI 7.0 STRL IVOR (GLOVE) ×3 IMPLANT
GOWN STRL REUS W/ TWL LRG LVL3 (GOWN DISPOSABLE) ×1 IMPLANT
GOWN STRL REUS W/ TWL XL LVL3 (GOWN DISPOSABLE) ×1 IMPLANT
GOWN STRL REUS W/TWL LRG LVL3 (GOWN DISPOSABLE) ×2
GOWN STRL REUS W/TWL XL LVL3 (GOWN DISPOSABLE) ×3
KIT BASIN OR (CUSTOM PROCEDURE TRAY) ×3 IMPLANT
KIT ROOM TURNOVER OR (KITS) ×3 IMPLANT
LIGASURE IMPACT 36 18CM CVD LR (INSTRUMENTS) ×3 IMPLANT
NEEDLE HYPO 25GX1X1/2 BEV (NEEDLE) ×3 IMPLANT
NS IRRIG 1000ML POUR BTL (IV SOLUTION) ×3 IMPLANT
PACK SURGICAL SETUP 50X90 (CUSTOM PROCEDURE TRAY) ×3 IMPLANT
PAD ARMBOARD 7.5X6 YLW CONV (MISCELLANEOUS) ×3 IMPLANT
PENCIL BUTTON HOLSTER BLD 10FT (ELECTRODE) ×3 IMPLANT
RELOAD PROXIMATE 75MM BLUE (ENDOMECHANICALS) ×6 IMPLANT
SPONGE GAUZE 2X2 STER 10/PKG (GAUZE/BANDAGES/DRESSINGS) ×2
SPONGE LAP 18X18 X RAY DECT (DISPOSABLE) ×3 IMPLANT
STAPLER GUN LINEAR PROX 60 (STAPLE) ×3 IMPLANT
STAPLER PROXIMATE 75MM BLUE (STAPLE) ×3 IMPLANT
STAPLER VISISTAT 35W (STAPLE) ×3 IMPLANT
STRIP CLOSURE SKIN 1/2X4 (GAUZE/BANDAGES/DRESSINGS) ×2 IMPLANT
SUCTION POOLE TIP (SUCTIONS) ×3 IMPLANT
SUT MNCRL AB 4-0 PS2 18 (SUTURE) ×3 IMPLANT
SUT NOVA NAB DX-16 0-1 5-0 T12 (SUTURE) ×3 IMPLANT
SUT SILK 2 0 (SUTURE) ×3
SUT SILK 2-0 18XBRD TIE 12 (SUTURE) ×1 IMPLANT
SUT SILK 3 0 SH CR/8 (SUTURE) ×6 IMPLANT
SUT VIC AB 3-0 SH 27 (SUTURE) ×2
SUT VIC AB 3-0 SH 27X BRD (SUTURE) ×1 IMPLANT
SYR CONTROL 10ML LL (SYRINGE) ×3 IMPLANT
TOWEL OR 17X24 6PK STRL BLUE (TOWEL DISPOSABLE) ×3 IMPLANT
TOWEL OR 17X26 10 PK STRL BLUE (TOWEL DISPOSABLE) ×3 IMPLANT
TUBE CONNECTING 12'X1/4 (SUCTIONS)
TUBE CONNECTING 12X1/4 (SUCTIONS) IMPLANT
YANKAUER SUCT BULB TIP NO VENT (SUCTIONS) ×3 IMPLANT

## 2014-01-28 NOTE — Care Management Note (Signed)
    Page 1 of 1   02/01/2014     4:17:28 PM CARE MANAGEMENT NOTE 02/01/2014  Patient:  [name], [name]   Account Number:  [id number]  Date Initiated:  01/28/2014  Documentation initiated by:  Mairead Schwarzkopf  Subjective/Objective Assessment:   Pt adm on 4/20 with SBO due to umbilical hernia.  To OR today for emergent surgery.     Action/Plan:   Case management to follow post-op for discharge needs as pt progresses.   Anticipated DC Date:  02/02/2014   Anticipated DC Plan:  SKILLED NURSING FACILITY  In-house referral  Clinical Social Worker      DC Planning Services  CM consult      Choice offered to / List presented to:             Status of service:  Completed, signed off Medicare Important Message given?   (If response is "NO", the following Medicare IM given date fields will be blank) Date Medicare IM given:   Date Additional Medicare IM given:    Discharge Disposition:  [facility] [place]  Per UR Regulation:  Reviewed for med. necessity/level of care/duration of stay  If discussed at [place] of Stay Meetings, dates discussed:    Comments:  02/01/14 Ashley Murrain, BSN [phone] Plan dc to SNF on 4/25, if remains medically stable.  01/31/14 Ashley Murrain, BSN [phone] P.T. recommending ST-SNF at dc, and pt agreeable.  CSW following to facilitate this when medically stable for dc.

## 2014-01-28 NOTE — Progress Notes (Addendum)
Pt admitted to unit. Oriented to room. Stating he had a large bag of belongings with cell phone, not brought by PACU. RN called PACU- not located there. Attempted 2W x2- no answer. Will continue to try to locate belongings.   Pt states he drinks 2-3 wild turkeys per day. Last drink was Sunday 4/19, had one whiskey drink.   1735: Belongings brought to patient. Phone given to patient. Belongings placed in closet.

## 2014-01-28 NOTE — H&P (Signed)
[facility] History and Physical  Patient: [name]  CWC:[id number]  DOB: [birth year]  DOS: the patient was seen and examined on 01/28/2014 PCP: Jene Every, MD  Chief Complaint: Abdominal pain nausea and vomiting  HPI: MAXXWELL EDGETT is a 68 y.o. male with Past medical history of COPD, sleep apnea, peripheral vascular disease, coronary artery disease, diabetes mellitus, hypertension, hypothyroidism, atrial fibrillation, chronic diastolic heart failure. The patient presented with complaints of abdominal pain that has been ongoing since last Friday. This is associated with persistent nausea and multiple episodes of vomiting without any blood. She also complains of persistent heartburn and acid reflux. The patient mentions that today his pain was significantly worse and this was located in the lower abdomen area. He described his pain as burning. Patient denies any fever, chills, nausea at present, diarrhea, burning urination, CVA pain. He denies any recent change in his medication. He has a history of SBO in the past but at that time he did not have any pain. Patient has not been on Xarelto yet.  The patient is coming from home. And at her baseline Independent for most of his  ADL.  Review of Systems: as mentioned in the history of present illness.  A Comprehensive review of the other systems is negative.  Past Medical History  Diagnosis Date  . CHRONIC OBSTRUCTIVE PULMONARY DISEASE 06/20/2009  . OBSTRUCTIVE SLEEP APNEA 06/20/2009  . CAROTID STENOSIS 06/20/2009    A. 08/2001 s/p L CEA;  B.   09/14/11 - Carotid U/S - 40-59% bilateral stenosis, left CEA patch angioplasty is patent  . DM 06/20/2009  . CAD 06/20/2009    A.  08/2000 - s/p CABG x 4 - LIMA-LAD, Left Radial-OM, VG-DIAG, VG-RCA;  B. Neg. MV  2010  . HYPERLIPIDEMIA 06/20/2009  . HYPERTENSION 06/20/2009  . Hypothyroidism   . Low back pain   . Asthma     as child  . Pneumonia   . Atrial fibrillation     Not felt to be  coumadin candidate 2/2 ETOH use.  Marland Kitchen ETOH abuse   . History of tobacco abuse     remote - quit 1970  . Bilateral renal cysts   . Marijuana abuse   . Morbidly obese   . CHF (congestive heart failure)   . Falls frequently    Past Surgical History  Procedure Laterality Date  . Carotid endarterectomy  2002    left  . Coronary artery bypass graft      x 4 - 2001   Social History:  reports that he quit smoking about 45 years ago. He does not have any smokeless tobacco history on file. He reports that he drinks alcohol. He reports that he uses illicit drugs (Marijuana).  Allergies  Allergen Reactions  . Erythromycin Hives    Family History  Problem Relation Age of Onset  . Stroke Mother     ?  . Stroke Father     ?    Prior to Admission medications   Medication Sig Start Date End Date Taking? Authorizing Provider  aspirin EC 325 MG tablet Take 325 mg by mouth daily.    Historical Provider, MD  carvedilol (COREG) 3.125 MG tablet Take 1 tablet (3.125 mg total) by mouth 2 (two) times daily with a meal. To be taken with 6.25mg  bid TO EQUAL 9.375MG  09/14/13   Jolaine Artist, MD  carvedilol (COREG) 6.25 MG tablet Take 6.25 mg by mouth 2 (two) times daily with a meal. Take  along with 3.125 mg tabs to equal 9.375 mg    Historical Provider, MD  colchicine 0.6 MG tablet Take 0.6 mg by mouth daily.    Historical Provider, MD  FLUoxetine (PROZAC) 20 MG capsule Take 20 mg by mouth daily.      Historical Provider, MD  fluticasone (FLOVENT HFA) 220 MCG/ACT inhaler Inhale 1 puff into the lungs 2 (two) times daily as needed (for shortness of breath).     Historical Provider, MD  folic acid (FOLVITE) 1 MG tablet Take 1 tablet (1 mg total) by mouth daily. 09/07/12   Srikar Janna Arch, MD  gabapentin (NEURONTIN) 300 MG capsule Take 1 capsule (300 mg total) by mouth daily. 09/07/12   Srikar Janna Arch, MD  hydrALAZINE (APRESOLINE) 25 MG tablet Take 1 tablet (25 mg total) by mouth 3 (three) times daily.  04/27/13   Rande Brunt, NP  insulin glargine (LANTUS) 100 UNIT/ML injection Inject 10 Units into the skin at bedtime. 09/21/12   Bary Leriche, PA-C  lactulose (CHRONULAC) 10 GM/15ML solution Take 45 mLs (30 g total) by mouth daily. 09/21/12   Bary Leriche, PA-C  levothyroxine (SYNTHROID, LEVOTHROID) 150 MCG tablet Take 150 mcg by mouth daily.     Historical Provider, MD  metolazone (ZAROXOLYN) 2.5 MG tablet Take 2.5 mg by mouth once a week. Every Monday 09/25/13   Amy D Ninfa Meeker, NP  Multiple Vitamin (MULTIVITAMIN) capsule Take 1 capsule by mouth daily.    Historical Provider, MD  omeprazole (PRILOSEC) 20 MG capsule Take 20 mg by mouth daily.      Historical Provider, MD  Oxycodone HCl 10 MG TABS Take 1 tablet (10 mg total) by mouth every 6 (six) hours as needed. 05/10/13   Pricilla Larsson, NP  senna (SENOKOT) 8.6 MG tablet Take 2 tablets by mouth daily as needed for constipation.     Historical Provider, MD  simvastatin (ZOCOR) 40 MG tablet Take 40 mg by mouth at bedtime.      Historical Provider, MD  spironolactone (ALDACTONE) 25 MG tablet Take 1 tablet (25 mg total) by mouth daily. 08/06/13   Jolaine Artist, MD  torsemide (DEMADEX) 20 MG tablet Take 80 mg (4 tablets) in the am and 80 mg (4 tabs) in PM 08/16/13   Rande Brunt, NP    Physical Exam: Filed Vitals:   01/28/14 0346  BP: 164/88  Pulse: 88  Temp: 97.5 F (36.4 C)  TempSrc: Oral  Resp: 20  Height: 5' 9.5" (1.765 m)  Weight: 105.733 kg (233 lb 1.6 oz)  SpO2: 97%    General: Alert, Awake and Oriented to Time, Place and Person. Appear in moderate distress Eyes: PERRL ENT: Oral Mucosa clear moist. Neck: No JVD Cardiovascular: S1 and S2 Present, aortic systolic Murmur, Peripheral Pulses Present Respiratory: Bilateral Air entry equal and Decreased, Clear to Auscultation,  No Crackles, no wheezes Abdomen: Bowel Sound Present, Soft and Non tender Skin: No Rash Extremities: Trace Pedal edema, no calf  tenderness Neurologic: Grossly Unremarkable.  Labs on Admission:   CBC: No results found for this basename: WBC, NEUTROABS, HGB, HCT, MCV, PLT,  in the last 168 hours  CMP     Component Value Date/Time   NA 137 01/17/2014 1541   K 3.7 01/17/2014 1541   CL 88* 01/17/2014 1541   CO2 31 01/17/2014 1541   GLUCOSE 198* 01/17/2014 1541   BUN 78* 01/17/2014 1541   CREATININE 1.20 01/17/2014 1541   CALCIUM 9.8 01/17/2014  1541   PROT 7.4 04/14/2013 1320   ALBUMIN 3.4* 04/14/2013 1320   AST 42* 04/14/2013 1320   ALT 16 04/14/2013 1320   ALKPHOS 92 04/14/2013 1320   BILITOT 0.7 04/14/2013 1320   GFRNONAA 60* 01/17/2014 1541   GFRAA 69* 01/17/2014 1541    No results found for this basename: LIPASE, AMYLASE,  in the last 168 hours No results found for this basename: AMMONIA,  in the last 168 hours  No results found for this basename: CKTOTAL, CKMB, CKMBINDEX, TROPONINI,  in the last 168 hours BNP (last 3 results)  Recent Labs  04/14/13 1301 08/16/13 1351 01/17/14 1541  PROBNP 6847.0* 6560.0* 4873.0*    Radiological Exams on Admission: No results found.   Assessment/Plan Principal Problem:   SBO (small bowel obstruction) Active Problems:   HYPERTENSION   DIASTOLIC HEART FAILURE, CHRONIC   CAROTID STENOSIS   CHRONIC OBSTRUCTIVE PULMONARY DISEASE   Atrial fibrillation   Hypothyroidism   Diabetes mellitus   1. SBO (small bowel obstruction) Patient is presenting with complaints of abdominal pain nausea and vomiting. He mentions his last bowel movement was last Saturday. He has not been passing gas today. He is abdominal pain is progressively worsening and located in the lower abdomen. A CT scan done at other hospital is positive for small bowel obstruction due to umbilical hernia. At present other than mild hypokalemia his electrolyte and vitals are stable patient will be admitted to the hospital under telemetry, I would continue him on IV Zofran and IV Protonix as scheduled.  I would hold off on fluid  as patient has history of significant chronic diastolic dysfunction, but I would hold his diuretics. General surgery has been consulted and will follow the patient. NG tube inserted and will be placed on low intermittent suction. IV Zofran and IV Protonix  2. diabetes mellitus Placing the patient on sliding scale hold Lantus  3. A. Fib Diastolic dysfunction Continue antihypertensive medications Holding on anticoagulation Continue aspirin  Consults: General surgery   DVT Prophylaxis: subcutaneous Heparin Nutrition: N.p.o.   Code Status: Full   Disposition: Admitted to inpatient in telemetry unit.  Author: Berle Mull, MD [facility] Pager: [phone] 01/28/2014, 4:41 AM    If 7PM-7AM, please contact night-coverage [email or web address] Password [email or web address]

## 2014-01-28 NOTE — Anesthesia Preprocedure Evaluation (Addendum)
Anesthesia Evaluation  Patient identified by MRN, date of birth, ID band Patient awake    Reviewed: Allergy & Precautions, H&P , NPO status , Patient's Chart, lab work & pertinent test results  Airway       Dental   Pulmonary asthma , sleep apnea , COPDformer smoker,          Cardiovascular hypertension, + CAD, + Past MI, + CABG, + Peripheral Vascular Disease and +CHF     Neuro/Psych    GI/Hepatic   Endo/Other  diabetes, Type 2Hypothyroidism   Renal/GU Renal InsufficiencyRenal disease     Musculoskeletal   Abdominal   Peds  Hematology   Anesthesia Other Findings   Reproductive/Obstetrics                          Anesthesia Physical Anesthesia Plan  ASA: IV  Anesthesia Plan: General   Post-op Pain Management:    Induction: Intravenous  Airway Management Planned: Oral ETT  Additional Equipment:   Intra-op Plan:   Post-operative Plan: Extubation in OR  Informed Consent: I have reviewed the patients History and Physical, chart, labs and discussed the procedure including the risks, benefits and alternatives for the proposed anesthesia with the patient or authorized representative who has indicated his/her understanding and acceptance.     Plan Discussed with:   Anesthesia Plan Comments:         Anesthesia Quick Evaluation

## 2014-01-28 NOTE — Plan of Care (Signed)
Problem: Phase I Progression Outcomes Goal: Voiding-avoid urinary catheter unless indicated Outcome: Not Met (add Reason) Foley in place post-op

## 2014-01-28 NOTE — Op Note (Signed)
HERNIA REPAIR UMBILICAL ADULT/INC, EXPLORATORY LAPAROTOMY, SMALL BOWEL RESECTION  Procedure Note  HUNT ZAJICEK 01/28/2014   Pre-op Diagnosis: INCARCERATED UMBILICAL HERNIA     Post-op Diagnosis: STRANGULATED UMBILICAL HERNIA  Procedure(s): HERNIA REPAIR UMBILICAL ADULT/INC EXPLORATORY LAPAROTOMY SMALL BOWEL RESECTION  Surgeon(s): Harl Bowie, MD  Anesthesia: General  Staff:  Circulator: Montel Culver, RN; Dineen Kid, RN Scrub Person: Leslie Andrea, CST  Estimated Blood Loss: Minimal               Specimens: sent to path.  approx 2 inch area of infarcted bowel          Harl Bowie   Date: 01/28/2014  Time: 9:52 AM

## 2014-01-28 NOTE — Transfer of Care (Signed)
Immediate Anesthesia Transfer of Care Note  Patient: [name]  Procedure(s) Performed: Procedure(s): HERNIA REPAIR UMBILICAL ADULT/INC (N/A) EXPLORATORY LAPAROTOMY (N/A) SMALL BOWEL RESECTION (N/A)  Patient Location: PACU  Anesthesia Type:General  Level of Consciousness: awake, alert  and confused  Airway & Oxygen Therapy: Patient Spontanous Breathing and Patient connected to nasal cannula oxygen  Post-op Assessment: Report given to PACU RN and Post -op Vital signs reviewed and stable  Post vital signs: Reviewed and stable  Complications: No apparent anesthesia complications

## 2014-01-28 NOTE — OR Nursing (Signed)
Foreskin retracted for F/C insertion, revealed thick yellowish residue which was cleaned and foreskin put back in place.

## 2014-01-28 NOTE — Anesthesia Postprocedure Evaluation (Signed)
  Anesthesia Post-op Note  Patient: [name]  Procedure(s) Performed: Procedure(s): HERNIA REPAIR UMBILICAL ADULT/INC (N/A) EXPLORATORY LAPAROTOMY (N/A) SMALL BOWEL RESECTION (N/A)  Patient Location: PACU  Anesthesia Type:General  Level of Consciousness: awake and alert   Airway and Oxygen Therapy: Patient Spontanous Breathing  Post-op Pain: mild  Post-op Assessment: Post-op Vital signs reviewed, Patient's Cardiovascular Status Stable, Respiratory Function Stable, Patent Airway and No signs of Nausea or vomiting  Post-op Vital Signs: Reviewed and stable  Last Vitals:  Filed Vitals:   01/28/14 1448  BP: 138/78  Pulse: 90  Temp:   Resp: 17    Complications: No apparent anesthesia complications

## 2014-01-28 NOTE — Progress Notes (Addendum)
INITIAL NUTRITION ASSESSMENT  DOCUMENTATION CODES Per approved criteria  -Obesity Unspecified   INTERVENTION: Advance diet vs initiate nutrition support as medically appropriate  RD to follow for nutrition care plan  NUTRITION DIAGNOSIS: Inadequate oral intake related to inability to eat as evidenced by NPO status  Goal: Pt to meet >/= 90% of their estimated nutrition needs   Monitor:  Nutrition support initiation vs PO diet advancement, weight, labs, I/O's  Reason for Assessment: Malnutrition Screening Tool Report  68 y.o. male  Admitting Dx: SBO (small bowel obstruction)  ASSESSMENT: 68 y.o. male with PMH of COPD, sleep apnea, peripheral vascular disease, coronary artery disease, diabetes mellitus, hypertension, hypothyroidism, atrial fibrillation, chronic diastolic heart failure.   Presented with complaints of abdominal pain that has been ongoing since last Friday. This is associated with persistent nausea and multiple episodes of vomiting without any blood.   Patient s/p procedures 4/20: HERNIA REPAIR UMBILICAL ADULT/INC  EXPLORATORY LAPAROTOMY  SMALL BOWEL RESECTION  Pt currently in PACU.  RD unable to obtain nutrition hx or Nutrition Focused Physical Exam at this time.  Height: Ht Readings from Last 1 Encounters:  01/28/14 5' 9.5" (1.765 m)    Weight: Wt Readings from Last 1 Encounters:  01/28/14 233 lb 1.6 oz (105.733 kg)    Ideal Body Weight: 160 lb  % Ideal Body Weight: 145%  Wt Readings from Last 10 Encounters:  01/28/14 233 lb 1.6 oz (105.733 kg)  01/28/14 233 lb 1.6 oz (105.733 kg)  01/17/14 239 lb 8 oz (108.636 kg)  09/25/13 242 lb (109.77 kg)  09/12/13 250 lb (113.399 kg)  08/16/13 246 lb (111.585 kg)  07/11/13 236 lb 1.9 oz (107.103 kg)  06/27/13 228 lb (103.42 kg)  06/14/13 228 lb (103.42 kg)  05/28/13 227 lb 12 oz (103.307 kg)    Usual Body Weight: 239 lb  % Usual Body Weight: 97%  BMI:  Body mass index is 33.94  kg/(m^2).  Estimated Nutritional Needs: Kcal: 2100-2300 Protein: 110-120 gm Fluid: 2.1-2.3 L  Skin: Intact  Diet Order: NPO  EDUCATION NEEDS: -No education needs identified at this time   Intake/Output Summary (Last 24 hours) at 01/28/14 1236 Last data filed at 01/28/14 0950  Gross per 24 hour  Intake    900 ml  Output    450 ml  Net    450 ml    Labs:   Recent Labs Lab 01/28/14 0535  NA 141  K 3.5*  CL 92*  CO2 33*  BUN 90*  CREATININE 1.06  CALCIUM 10.3  MG 1.7  GLUCOSE 211*    CBG (last 3)   Recent Labs  01/28/14 0355 01/28/14 0626 01/28/14 1008  GLUCAP 182* 199* 177*    Scheduled Meds: . [facility] HOLD] aspirin  324 mg Per J Tube Daily  . [facility] HOLD] carvedilol  3.125 mg Per NG tube BID WC  . [facility] HOLD] FLUoxetine  20 mg Per Tube Daily  . [[facility] HOLD] fluticasone  1 puff Inhalation BID  . [[facility] HOLD] heparin  5,000 Units Subcutaneous 3 times per day  . HYDROmorphone      . HYDROmorphone      . [[facility] HOLD] insulin aspart  0-5 Units Subcutaneous QHS  . [[facility] HOLD] insulin aspart  0-9 Units Subcutaneous Q6H  . [facility] HOLD] levothyroxine  150 mcg Per NG tube QAC breakfast  . [[facility] HOLD] sodium chloride  3 mL Intravenous Q12H    Continuous Infusions: . lactated ringers 75 mL/hr (01/28/14 1140)  Past Medical History  Diagnosis Date  . CHRONIC OBSTRUCTIVE PULMONARY DISEASE 06/20/2009  . OBSTRUCTIVE SLEEP APNEA 06/20/2009  . CAROTID STENOSIS 06/20/2009    A. 08/2001 s/p L CEA;  B.   09/14/11 - Carotid U/S - 40-59% bilateral stenosis, left CEA patch angioplasty is patent  . DM 06/20/2009  . CAD 06/20/2009    A.  08/2000 - s/p CABG x 4 - LIMA-LAD, Left Radial-OM, VG-DIAG, VG-RCA;  B. Neg. MV  2010  . HYPERLIPIDEMIA 06/20/2009  . HYPERTENSION 06/20/2009  . Hypothyroidism   . Low back pain   . Asthma     as child  . Pneumonia   . Atrial fibrillation     Not felt to be coumadin candidate 2/2 ETOH use.  Marland Kitchen ETOH abuse   . History of tobacco abuse     remote -  quit 1970  . Bilateral renal cysts   . Marijuana abuse   . Morbidly obese   . CHF (congestive heart failure)   . Falls frequently     Past Surgical History  Procedure Laterality Date  . Carotid endarterectomy  2002    left  . Coronary artery bypass graft      x 4 - 2001    Arthur Holms, RD, LDN Pager #: [phone] After-Hours Pager #: [phone]

## 2014-01-28 NOTE — Progress Notes (Signed)
Dr. Ree Kida paged regarding pts PO coreg. Returned call and new orders received to hold coreg and add hydralazine 5 mg IV Q6 PRN SBP>160 or DBP>110.

## 2014-01-28 NOTE — Progress Notes (Signed)
Utilization review completed.  

## 2014-01-28 NOTE — Consult Note (Signed)
Reason for Consult:Incarcerated umbilical hernia Referring Physician: Dr. Berle Mull  [name] is an 68 y.o. male.  HPI: This gentleman presents in transfer from [facility]. He presented there with sudden abdominal pain which started Saturday. He had nausea but no vomiting. He presented yesterday to the emergency department and underwent a CAT scan of the abdomen and pelvis showing an incarcerated umbilical hernia crating a small bowel obstruction. Because of his cardiac history, he was transferred here this morning. He now has a nasogastric tube in place. After receiving pain medication, he reports that his abdominal pain is better. His last bowel movement was Saturday. He is morbidly obese and did not know he had a hernia at the umbilicus.  Past Medical History  Diagnosis Date  . CHRONIC OBSTRUCTIVE PULMONARY DISEASE 06/20/2009  . OBSTRUCTIVE SLEEP APNEA 06/20/2009  . CAROTID STENOSIS 06/20/2009    A. 08/2001 s/p L CEA;  B.   09/14/11 - Carotid U/S - 40-59% bilateral stenosis, left CEA patch angioplasty is patent  . DM 06/20/2009  . CAD 06/20/2009    A.  08/2000 - s/p CABG x 4 - LIMA-LAD, Left Radial-OM, VG-DIAG, VG-RCA;  B. Neg. MV  2010  . HYPERLIPIDEMIA 06/20/2009  . HYPERTENSION 06/20/2009  . Hypothyroidism   . Low back pain   . Asthma     as child  . Pneumonia   . Atrial fibrillation     Not felt to be coumadin candidate 2/2 ETOH use.  Marland Kitchen ETOH abuse   . History of tobacco abuse     remote - quit 1970  . Bilateral renal cysts   . Marijuana abuse   . Morbidly obese   . CHF (congestive heart failure)   . Falls frequently     Past Surgical History  Procedure Laterality Date  . Carotid endarterectomy  2002    left  . Coronary artery bypass graft      x 4 - 2001    Family History  Problem Relation Age of Onset  . Stroke Mother     ?  . Stroke Father     ?    Social History:  reports that he quit smoking about 45 years ago. He does not have any smokeless tobacco  history on file. He reports that he drinks alcohol. He reports that he uses illicit drugs (Marijuana).  Allergies:  Allergies  Allergen Reactions  . Erythromycin Hives    Medications: I have reviewed the patient's current medications.  Results for orders placed during the hospital encounter of 01/28/14 (from the past 48 hour(s))  GLUCOSE, CAPILLARY     Status: Abnormal   Collection Time    01/28/14  3:55 AM      Result Value Ref Range   Glucose-Capillary 182 (*) 70 - 99 mg/dL   Comment 1 Notify RN     Comment 2 Documented in Chart    CBC WITH DIFFERENTIAL     Status: Abnormal   Collection Time    01/28/14  5:35 AM      Result Value Ref Range   WBC 9.3  4.0 - 10.5 K/uL   RBC 4.61  4.22 - 5.81 MIL/uL   Hemoglobin 13.2  13.0 - 17.0 g/dL   HCT 41.0  39.0 - 52.0 %   MCV 88.9  78.0 - 100.0 fL   MCH 28.6  26.0 - 34.0 pg   MCHC 32.2  30.0 - 36.0 g/dL   RDW 16.5 (*) 11.5 - 15.5 %  Platelets 164  150 - 400 K/uL   Neutrophils Relative % 79 (*) 43 - 77 %   Neutro Abs 7.4  1.7 - 7.7 K/uL   Lymphocytes Relative 9 (*) 12 - 46 %   Lymphs Abs 0.8  0.7 - 4.0 K/uL   Monocytes Relative 11  3 - 12 %   Monocytes Absolute 1.0  0.1 - 1.0 K/uL   Eosinophils Relative 1  0 - 5 %   Eosinophils Absolute 0.1  0.0 - 0.7 K/uL   Basophils Relative 0  0 - 1 %   Basophils Absolute 0.0  0.0 - 0.1 K/uL  PROTIME-INR     Status: None   Collection Time    01/28/14  5:35 AM      Result Value Ref Range   Prothrombin Time 13.7  11.6 - 15.2 seconds   INR 1.07  0.00 - 1.49  COMPREHENSIVE METABOLIC PANEL     Status: Abnormal   Collection Time    01/28/14  5:35 AM      Result Value Ref Range   Sodium 141  137 - 147 mEq/L   Potassium 3.5 (*) 3.7 - 5.3 mEq/L   Chloride 92 (*) 96 - 112 mEq/L   CO2 33 (*) 19 - 32 mEq/L   Glucose, Bld 211 (*) 70 - 99 mg/dL   BUN 90 (*) 6 - 23 mg/dL   Creatinine, Ser 1.06  0.50 - 1.35 mg/dL   Calcium 10.3  8.4 - 10.5 mg/dL   Total Protein 8.0  6.0 - 8.3 g/dL   Albumin 3.4  (*) 3.5 - 5.2 g/dL   AST 17  0 - 37 U/L   ALT 10  0 - 53 U/L   Alkaline Phosphatase 81  39 - 117 U/L   Total Bilirubin 0.5  0.3 - 1.2 mg/dL   GFR calc non Af Amer 69 (*) >90 mL/min   GFR calc Af Amer 80 (*) >90 mL/min   Comment: (NOTE)     The eGFR has been calculated using the CKD EPI equation.     This calculation has not been validated in all clinical situations.     eGFR's persistently <90 mL/min signify possible Chronic Kidney     Disease.  MAGNESIUM     Status: None   Collection Time    01/28/14  5:35 AM      Result Value Ref Range   Magnesium 1.7  1.5 - 2.5 mg/dL  GLUCOSE, CAPILLARY     Status: Abnormal   Collection Time    01/28/14  6:26 AM      Result Value Ref Range   Glucose-Capillary 199 (*) 70 - 99 mg/dL   Comment 1 Documented in Chart     Comment 2 Notify RN      No results found.  Review of Systems  Constitutional: Negative for fever and chills.  HENT: Positive for hearing loss.   Respiratory: Negative for shortness of breath and wheezing.   Cardiovascular: Positive for leg swelling. Negative for chest pain.  Gastrointestinal: Positive for nausea and abdominal pain. Negative for vomiting.  Neurological: Positive for weakness.  All other systems reviewed and are negative.  Blood pressure 164/88, pulse 88, temperature 97.5 F (36.4 C), temperature source Oral, resp. rate 20, height 5' 9.5" (1.765 m), weight 233 lb 1.6 oz (105.733 kg), SpO2 97.00%. Physical Exam  Constitutional: He is oriented to person, place, and time. No distress.  Morbidly obese  HENT:  Head: Normocephalic and  atraumatic.  Right Ear: External ear normal.  Left Ear: External ear normal.  Nose: Nose normal.  Mouth/Throat: Oropharynx is clear and moist. No oropharyngeal exudate.  Eyes: Conjunctivae are normal. Pupils are equal, round, and reactive to light. Right eye exhibits no discharge. Left eye exhibits no discharge. No scleral icterus.  Neck: Normal range of motion. Neck supple. No  tracheal deviation present.  Cardiovascular: Normal rate, regular rhythm and normal heart sounds.   No murmur heard. Respiratory: Effort normal and breath sounds normal. No respiratory distress. He has no wheezes. He has no rales.  GI: Soft. He exhibits no distension. There is tenderness. There is guarding.  There is an incarcerated hernia at the umbilicus with mild erythema. There is tenderness of the umbilicus but no other abdominal tenderness  Musculoskeletal: Normal range of motion. He exhibits edema and tenderness.  Lymphadenopathy:    He has no cervical adenopathy.  Neurological: He is alert and oriented to person, place, and time.  Skin: Skin is warm and dry. No rash noted. He is not diaphoretic. There is erythema.  Psychiatric: His behavior is normal.    Assessment/Plan: Incarcerated umbilical hernia with small bowel obstruction  I discussed the diagnosis with the patient. He needs emergent repair of his hernia today to evaluate for bowel ischemia and to relieve the hernia and obstruction. I discussed this with him in detail. I discussed the risks of surgery which include but not limited to bleeding, infection, need for about resection, use of mesh, cardiopulmonary issues given his significant cardiac history, etc. He understands and wishes to proceed with emergency surgery which is just been scheduled  Harl Bowie 01/28/2014, 7:48 AM

## 2014-01-28 NOTE — Anesthesia Procedure Notes (Signed)
Procedure Name: Intubation Date/Time: 01/28/2014 9:00 AM Performed by: Manuela Schwartz B Pre-anesthesia Checklist: Patient identified, Emergency Drugs available, Suction available, Patient being monitored and Timeout performed Patient Re-evaluated:Patient Re-evaluated prior to inductionOxygen Delivery Method: Circle system utilized Preoxygenation: Pre-oxygenation with 100% oxygen Intubation Type: IV induction and Rapid sequence Laryngoscope Size: Mac and 3 Grade View: Grade I Tube type: Oral Tube size: 7.5 mm Number of attempts: 1 Airway Equipment and Method: Stylet Placement Confirmation: ETT inserted through vocal cords under direct vision,  positive ETCO2 and breath sounds checked- equal and bilateral Secured at: 22 cm Tube secured with: Tape Dental Injury: Teeth and Oropharynx as per pre-operative assessment

## 2014-01-28 NOTE — Progress Notes (Signed)
   [facility]                                                                              Patient Demographics  [name], is a 68 y.o. male, DOB - 08/06/1946, VOZ:[id number]  Admit date - 01/28/2014   Admitting Physician Rise Patience, MD  Outpatient Primary MD for the patient is GIBSON,DAVID, MD  LOS - 0   No chief complaint on file.     HPI: [name] is a 68 y.o. male with Past medical history of COPD, sleep apnea, peripheral vascular disease, coronary artery disease, diabetes mellitus, hypertension, hypothyroidism, atrial fibrillation, chronic diastolic heart failure. The patient presented with complaints of abdominal pain that has been ongoing since last Friday. This is associated with persistent nausea and multiple episodes of vomiting without any blood. He also complains of persistent heartburn and acid reflux. The patient mentions that today his pain was significantly worse and this was located in the lower abdomen area. He described his pain as burning. Patient denies any fever, chills, nausea at present, diarrhea, burning urination, CVA pain.  He denied any recent change in his medication. He has a history of SBO in the past but at that time he did not have any pain. Patient has not been on Xarelto yet.  Patient was transferred from [facility] to [place]  Patient admitted this morning. Agree with current assessment and plan done by Dr. Posey Pronto.  Abdominal pain with nausea and vomiting secondary to small bowel obstruction -CT of the abdomen shows a small bowel obstruction due to umbilical hernia (done in [place]) -General surgery consulted, patient will have surgery today -Continue IV Zofran and Protonix -Currently has NG tube in place -Continue n.p.o. and pain control  Diabetes mellitus -Continue insulin sliding scale with CBG monitoring -Lantus currently held  Atrial fibrillation -Currently rate controlled, continue Coreg -Patient has  not yet started Xarelto -Anticoagulation held due to surgery  Chronic diastolic heart failure -Appears to be compensated -Lasix currently held, as patient is not receiving any fluids for his small bowel obstruction -Will monitor strict intake and output and daily weights  Depression -Continue fluoxetine  Hypothyroidism -Continue Synthroid  Code Status: Full  Family Communication: Family at bedside  Disposition Plan: Admitted  Time Spent in minutes   30 minutes  Procedures none  Consults  General surgery  DVT Prophylaxis  Heparin  Cherylynn Liszewski D.O. on 01/28/2014 at 8:29 AM  Between 7am to 7pm - Pager - [phone]  After 7pm go to [email or web address] - password [email or web address]  And look for the night coverage person covering for me after hours  [facility] Office  [phone]

## 2014-01-29 DIAGNOSIS — N179 Acute kidney failure, unspecified: Secondary | ICD-10-CM

## 2014-01-29 LAB — CBC
HCT: 38.9 % — ABNORMAL LOW (ref 39.0–52.0)
Hemoglobin: 12.2 g/dL — ABNORMAL LOW (ref 13.0–17.0)
MCH: 28.8 pg (ref 26.0–34.0)
MCHC: 31.4 g/dL (ref 30.0–36.0)
MCV: 91.7 fL (ref 78.0–100.0)
Platelets: 152 10*3/uL (ref 150–400)
RBC: 4.24 MIL/uL (ref 4.22–5.81)
RDW: 16.9 % — AB (ref 11.5–15.5)
WBC: 8.5 10*3/uL (ref 4.0–10.5)

## 2014-01-29 LAB — BASIC METABOLIC PANEL
BUN: 87 mg/dL — ABNORMAL HIGH (ref 6–23)
CHLORIDE: 96 meq/L (ref 96–112)
CO2: 33 meq/L — AB (ref 19–32)
Calcium: 10.1 mg/dL (ref 8.4–10.5)
Creatinine, Ser: 1.36 mg/dL — ABNORMAL HIGH (ref 0.50–1.35)
GFR calc Af Amer: 59 mL/min — ABNORMAL LOW (ref >90)
GFR calc non Af Amer: 51 mL/min — ABNORMAL LOW (ref >90)
GLUCOSE: 161 mg/dL — AB (ref 70–99)
POTASSIUM: 3.8 meq/L (ref 3.7–5.3)
Sodium: 144 mEq/L (ref 137–147)

## 2014-01-29 LAB — GLUCOSE, CAPILLARY
GLUCOSE-CAPILLARY: 110 mg/dL — AB (ref 70–99)
GLUCOSE-CAPILLARY: 124 mg/dL — AB (ref 70–99)
Glucose-Capillary: 151 mg/dL — ABNORMAL HIGH (ref 70–99)
Glucose-Capillary: 176 mg/dL — ABNORMAL HIGH (ref 70–99)
Glucose-Capillary: 168 mg/dL — ABNORMAL HIGH (ref 70–99)

## 2014-01-29 MED ORDER — ASPIRIN 81 MG PO CHEW
81.0000 mg | CHEWABLE_TABLET | Freq: Every day | ORAL | Status: DC
  Administered 2014-01-30 – 2014-02-04 (×6): 81 mg via JEJUNOSTOMY
  Filled 2014-01-29 (×6): qty 1

## 2014-01-29 MED ORDER — LACTATED RINGERS IV SOLN
INTRAVENOUS | Status: AC
  Administered 2014-01-29: 21:00:00 via INTRAVENOUS

## 2014-01-29 MED ORDER — INSULIN ASPART 100 UNIT/ML ~~LOC~~ SOLN
0.0000 [IU] | SUBCUTANEOUS | Status: DC
  Administered 2014-01-29 – 2014-01-30 (×3): 1 [IU] via SUBCUTANEOUS

## 2014-01-29 MED ORDER — CARVEDILOL 3.125 MG PO TABS
3.1250 mg | ORAL_TABLET | Freq: Two times a day (BID) | ORAL | Status: DC
  Administered 2014-01-29 – 2014-02-04 (×13): 3.125 mg
  Filled 2014-01-29 (×15): qty 1

## 2014-01-29 NOTE — Progress Notes (Addendum)
PROGRESS NOTE    [name]:[id number] DOB: July 26, 1946 DOA: 01/28/2014 PCP: Jene Every, MD  HPI/Brief narrative 68 y.o. male with Past medical history of COPD, sleep apnea, peripheral vascular disease, coronary artery disease, diabetes mellitus, hypertension, hypothyroidism, atrial fibrillation, chronic diastolic heart failure presented with abdominal pain, nausea and vomiting. He was diagnosed with strangulated umbilical hernia and underwent surgery for same on 01/28/14.   Assessment/Plan:  1. Strangulated umbilical hernia: Status post hernia repair by surgery on 4/20. Management per surgery. As per surgery, continue NG tube for 24-48 hours and continue Foley catheter for today. Currently n.p.o. except meds through the NG tube and ice chips. 2. Mild AKI: Likely prerenal. Continue gentle IV fluid hydration and follow BMP 3. Type II DM: Reasonable inpatient control. Continue SSI. 4. Atrial fibrillation: Controlled ventricular rate. Patient had not been getting carvedilol-start 3.125 twice a day via NG tube (low dose secondary to soft blood pressures). Not on anticoagulation PTA. Currently on aspirin. 5. Chronic diastolic CHF: Compensated. Monitor closely while on IV fluids. 6. Hypothyroidism: Continue Synthroid   Code Status: Full Family Communication: None at bedside Disposition Plan: Transfer to telemetry   Consultants:  General surgery  Procedures:  Exploratory laparotomy, small bowel resection and umbilical hernia repair  NG tube  Foley catheter  Antibiotics:  Ancef  Subjective: Denies complaints. Mild abdominal expected soreness. Denies dyspnea.  Objective: Filed Vitals:   01/29/14 0841 01/29/14 1124 01/29/14 1138 01/29/14 1500  BP:  110/61    Pulse:  78    Temp:   97.5 F (36.4 C) 97.8 F (36.6 C)  TempSrc:   Oral Oral  Resp:  18    Height:      Weight:      SpO2: 93% 93%      Intake/Output Summary (Last 24 hours) at 01/29/14 1731 Last data  filed at 01/29/14 1500  Gross per 24 hour  Intake    720 ml  Output   2125 ml  Net  -1405 ml   Filed Weights   01/28/14 0346 01/28/14 1448  Weight: 105.733 kg (233 lb 1.6 oz) 108.4 kg (238 lb 15.7 oz)     Exam:  General exam: Pleasant elderly male lying comfortably in bed. Respiratory system: Clear. No increased work of breathing. Cardiovascular system: S1 & S2 heard, irregularly irregular. No JVD, murmurs, gallops, clicks or pedal edema. Telemetry: Atrial fibrillation with controlled ventricular rate. Gastrointestinal system: Abdomen is nondistended, soft and nontender. Laparotomy site clean and dry. Normal bowel sounds heard. Central nervous system: Alert and oriented. No focal neurological deficits. Extremities: Symmetric 5 x 5 power.   Data Reviewed: Basic Metabolic Panel:  Recent Labs Lab 01/28/14 0535 01/29/14 0240  NA 141 144  K 3.5* 3.8  CL 92* 96  CO2 33* 33*  GLUCOSE 211* 161*  BUN 90* 87*  CREATININE 1.06 1.36*  CALCIUM 10.3 10.1  MG 1.7  --    Liver Function Tests:  Recent Labs Lab 01/28/14 0535  AST 17  ALT 10  ALKPHOS 81  BILITOT 0.5  PROT 8.0  ALBUMIN 3.4*   No results found for this basename: LIPASE, AMYLASE,  in the last 168 hours No results found for this basename: AMMONIA,  in the last 168 hours CBC:  Recent Labs Lab 01/28/14 0535 01/29/14 0240  WBC 9.3 8.5  NEUTROABS 7.4  --   HGB 13.2 12.2*  HCT 41.0 38.9*  MCV 88.9 91.7  PLT 164 152   Cardiac Enzymes: No results  found for this basename: CKTOTAL, CKMB, CKMBINDEX, TROPONINI,  in the last 168 hours BNP (last 3 results)  Recent Labs  04/14/13 1301 08/16/13 1351 01/17/14 1541  PROBNP 6847.0* 6560.0* 4873.0*   CBG:  Recent Labs Lab 01/28/14 1008 01/28/14 1613 01/29/14 0014 01/29/14 0534 01/29/14 1144  GLUCAP 177* 153* 151* 176* 168*    Recent Results (from the past 240 hour(s))  SURGICAL PCR SCREEN     Status: None   Collection Time    01/28/14  8:13 AM       Result Value Ref Range Status   MRSA, PCR NEGATIVE  NEGATIVE Final   Staphylococcus aureus NEGATIVE  NEGATIVE Final   Comment:            The Xpert SA Assay (FDA     approved for NASAL specimens     in patients over 66 years of age),     is one component of     a comprehensive surveillance     program.  Test performance has     been validated by [employer] for patients greater     than or equal to 36 year old.     It is not intended     to diagnose infection nor to     guide or monitor treatment.       Studies: No results found.      Scheduled Meds: . aspirin  324 mg Per J Tube Daily  . carvedilol  3.125 mg Per NG tube BID WC  . FLUoxetine  20 mg Per Tube Daily  . fluticasone  1 puff Inhalation BID  . heparin  5,000 Units Subcutaneous 3 times per day  . insulin aspart  0-5 Units Subcutaneous QHS  . insulin aspart  0-9 Units Subcutaneous Q6H  . levothyroxine  150 mcg Per NG tube QAC breakfast  . sodium chloride  3 mL Intravenous Q12H   Continuous Infusions: . lactated ringers 75 mL/hr at 01/29/14 0751    Principal Problem:   SBO (small bowel obstruction) Active Problems:   HYPERTENSION   DIASTOLIC HEART FAILURE, CHRONIC   CAROTID STENOSIS   CHRONIC OBSTRUCTIVE PULMONARY DISEASE   Atrial fibrillation   Hypothyroidism   Diabetes mellitus    Time spent: 40 minutes.    Modena Jansky, MD, FACP, [facility]. [facility] Pager [phone]  If 7PM-7AM, please contact night-coverage [email or web address] Password [email or web address] 01/29/2014, 5:31 PM    LOS: 1 day

## 2014-01-29 NOTE — Op Note (Signed)
NAMEESHAN, TRUPIANO NO.:  [id number]  MEDICAL RECORD NO.:  [id number]  LOCATION:  3S11C                        FACILITY:  [facility]  PHYSICIAN:  Coralie Keens, M.D. DATE OF BIRTH:  1946/07/19  DATE OF PROCEDURE:  01/28/2014 DATE OF DISCHARGE:                              OPERATIVE REPORT   PREOPERATIVE DIAGNOSIS:  Incarcerated umbilical hernia.  POSTOPERATIVE DIAGNOSIS:  Strangulated umbilical hernia.  PROCEDURES: 1. Exploratory laparotomy. 2. Small bowel resection. 3. Primary repair of strangulated umbilical hernia.  SURGEON:  Coralie Keens, M.D.  ANESTHESIA:  General and 0.25% Marcaine.  ESTIMATED BLOOD LOSS:  Minimal.  INDICATIONS:  This is a 68 year old gentleman who presents in transfer from [facility] with findings on CAT scan consistent with an incarcerated umbilical hernia, draining a small bowel obstruction. Decision was made to proceed emergently to the operating room.  FINDINGS:  The patient was found to have approximately 2 inches of infarcted small bowel contained in the incarcerated hernia.  The small bowel had to be resected.  PROCEDURE IN DETAIL:  The patient was brought to the operating room, identified as [name].  He was placed supine on the operating table and general anesthesia was induced.  His abdomen was then prepped and draped in usual sterile fashion.  I created a small midline incision around the umbilicus with a scalpel.  I took this down to the hernia sac, which was then opened and found to contain fluid and incarcerated small bowel.  Approximately 2 inches of small bowel was completely infarcted and ischemic.  At this point, I had to increase the size of the fascial defect with the cautery in order eviscerate enough small bowel for further resection.  Once this was done, I identified small bowel proximal-distal area of infarction and transected the GIA 75 stapler.  I took down the mesentery with the  LigaSure cautery device.  I then reapproximated the small bowel in a side-to-side fashion with interrupted silk sutures.  I created 2 enterotomies and then performed a side-to-side anastomosis with a single firing of the GIA 75 stapler. The open end was then closed with a TA 60 stapler.  I then inverted the staple line at the TA 60 staple line with interrupted 3-0 silk sutures. Another 3-0 silk suture was placed at the cross stitch.  The mesenteric defect was then closed with interrupted 3-0 silk sutures as well.  I then placed the bowel back into the abdominal cavity and thoroughly irrigated the abdomen with a L of saline.  I then closed the patient's midline fascia with figure-of-eight #1 Novafil sutures.  I irrigated the skin after excising the hernia sac with saline.  A small defect at the umbilical skin was repaired with Monocryl sutures.  I then anesthetized the skin and fascia with Marcaine and closed the skin with skin staples.  The patient tolerated the procedure well.  All the counts were correct at the end of procedure.  The patient was then extubated in operating room and taken in stable condition to recovery room.     Coralie Keens, M.D.     DB/MEDQ  D:  01/28/2014  T:  01/29/2014  Job:  [id number]

## 2014-01-29 NOTE — Progress Notes (Signed)
1 Day Post-Op  Subjective: comfortable  Objective: Vital signs in last 24 hours: Temp:  [97.4 F (36.3 C)-98.6 F (37 C)] 97.4 F (36.3 C) (04/21 0753) Pulse Rate:  [70-99] 88 (04/21 0530) Resp:  [11-27] 27 (04/21 0530) BP: (122-173)/(45-86) 132/77 mmHg (04/21 0530) SpO2:  [89 %-99 %] 94 % (04/21 0530) Weight:  [238 lb 15.7 oz (108.4 kg)] 238 lb 15.7 oz (108.4 kg) (04/20 1448) Last BM Date: 01/26/14  Intake/Output from previous day: 04/20 0701 - 04/21 0700 In: 1200 [I.V.:1200] Out: 1615 [Urine:1450; Emesis/NG output:165] Intake/Output this shift: Total I/O In: -  Out: 75 [Urine:75]  Lungs clear Abdomen soft, dressing dry  Lab Results:   Recent Labs  01/28/14 0535 01/29/14 0240  WBC 9.3 8.5  HGB 13.2 12.2*  HCT 41.0 38.9*  PLT 164 152   BMET  Recent Labs  01/28/14 0535 01/29/14 0240  NA 141 144  K 3.5* 3.8  CL 92* 96  CO2 33* 33*  GLUCOSE 211* 161*  BUN 90* 87*  CREATININE 1.06 1.36*  CALCIUM 10.3 10.1   PT/INR  Recent Labs  01/28/14 0535  LABPROT 13.7  INR 1.07   ABG No results found for this basename: PHART, PCO2, PO2, HCO3,  in the last 72 hours  Studies/Results: No results found.  Anti-infectives: Anti-infectives   Start     Dose/Rate Route Frequency Ordered Stop   01/28/14 1700  ceFAZolin (ANCEF) IVPB 2 g/50 mL premix     2 g 100 mL/hr over 30 Minutes Intravenous 3 times per day 01/28/14 1455 01/29/14 1359      Assessment/Plan: s/p Procedure(s): HERNIA REPAIR UMBILICAL ADULT/INC (N/A) EXPLORATORY LAPAROTOMY (N/A) SMALL BOWEL RESECTION (N/A)  His Cr is up a little.  Will keep fluids at current rate given his history of heart failure Will continue his NG for at least 24 to 48 more hours May get out of bed OK from surgery standpoint to go to floor  LOS: 1 day    Harl Bowie 01/29/2014

## 2014-01-30 DIAGNOSIS — E119 Type 2 diabetes mellitus without complications: Secondary | ICD-10-CM

## 2014-01-30 LAB — GLUCOSE, CAPILLARY
GLUCOSE-CAPILLARY: 117 mg/dL — AB (ref 70–99)
GLUCOSE-CAPILLARY: 117 mg/dL — AB (ref 70–99)
GLUCOSE-CAPILLARY: 125 mg/dL — AB (ref 70–99)
GLUCOSE-CAPILLARY: 141 mg/dL — AB (ref 70–99)
Glucose-Capillary: 116 mg/dL — ABNORMAL HIGH (ref 70–99)
Glucose-Capillary: 120 mg/dL — ABNORMAL HIGH (ref 70–99)
Glucose-Capillary: 117 mg/dL — ABNORMAL HIGH (ref 70–99)
Glucose-Capillary: 112 mg/dL — ABNORMAL HIGH (ref 70–99)

## 2014-01-30 LAB — BASIC METABOLIC PANEL
BUN: 96 mg/dL — ABNORMAL HIGH (ref 6–23)
CALCIUM: 9.8 mg/dL (ref 8.4–10.5)
CHLORIDE: 99 meq/L (ref 96–112)
CO2: 32 mEq/L (ref 19–32)
CREATININE: 1.92 mg/dL — AB (ref 0.50–1.35)
GFR calc Af Amer: 39 mL/min — ABNORMAL LOW (ref >90)
GFR calc non Af Amer: 34 mL/min — ABNORMAL LOW (ref >90)
Glucose, Bld: 120 mg/dL — ABNORMAL HIGH (ref 70–99)
Potassium: 3.8 mEq/L (ref 3.7–5.3)
Sodium: 147 mEq/L (ref 137–147)

## 2014-01-30 LAB — CBC
HCT: 36.4 % — ABNORMAL LOW (ref 39.0–52.0)
Hemoglobin: 11.1 g/dL — ABNORMAL LOW (ref 13.0–17.0)
MCH: 28.5 pg (ref 26.0–34.0)
MCHC: 30.5 g/dL (ref 30.0–36.0)
MCV: 93.6 fL (ref 78.0–100.0)
PLATELETS: 148 10*3/uL — AB (ref 150–400)
RBC: 3.89 MIL/uL — AB (ref 4.22–5.81)
RDW: 17 % — AB (ref 11.5–15.5)
WBC: 7.4 10*3/uL (ref 4.0–10.5)

## 2014-01-30 MED ORDER — OXYCODONE HCL 5 MG PO TABS
5.0000 mg | ORAL_TABLET | ORAL | Status: DC | PRN
  Administered 2014-01-30 – 2014-02-04 (×14): 5 mg via ORAL
  Filled 2014-01-30 (×15): qty 1

## 2014-01-30 MED ORDER — SODIUM CHLORIDE 0.9 % IV BOLUS (SEPSIS)
500.0000 mL | Freq: Once | INTRAVENOUS | Status: AC
  Administered 2014-01-30: 500 mL via INTRAVENOUS

## 2014-01-30 MED ORDER — SODIUM CHLORIDE 0.9 % IV SOLN
INTRAVENOUS | Status: AC
  Administered 2014-01-30: 500 mL via INTRAVENOUS

## 2014-01-30 NOTE — Progress Notes (Signed)
2 Days Post-Op  Subjective: No complaints No flatus yet  Objective: Vital signs in last 24 hours: Temp:  [97.4 F (36.3 C)-98.2 F (36.8 C)] 98.2 F (36.8 C) (04/22 0418) Pulse Rate:  [72-98] 91 (04/22 0418) Resp:  [12-18] 18 (04/22 0418) BP: (103-152)/(59-95) 114/59 mmHg (04/22 0418) SpO2:  [91 %-96 %] 96 % (04/22 0418) Weight:  [236 lb 12.4 oz (107.4 kg)] 236 lb 12.4 oz (107.4 kg) (04/22 0418) Last BM Date: 01/26/14  Intake/Output from previous day: 04/21 0701 - 04/22 0700 In: 1248 [P.O.:75; I.V.:1053; NG/GT:120] Out: 1550 [Urine:925; Emesis/NG output:625] Intake/Output this shift:    Lungs clear with no crackles Abdomen soft, incision stable, +BS  Lab Results:   Recent Labs  01/29/14 0240 01/30/14 0500  WBC 8.5 7.4  HGB 12.2* 11.1*  HCT 38.9* 36.4*  PLT 152 148*   BMET  Recent Labs  01/29/14 0240 01/30/14 0500  NA 144 147  K 3.8 3.8  CL 96 99  CO2 33* 32  GLUCOSE 161* 120*  BUN 87* 96*  CREATININE 1.36* 1.92*  CALCIUM 10.1 9.8   PT/INR  Recent Labs  01/28/14 0535  LABPROT 13.7  INR 1.07   ABG No results found for this basename: PHART, PCO2, PO2, HCO3,  in the last 72 hours  Studies/Results: No results found.  Anti-infectives: Anti-infectives   Start     Dose/Rate Route Frequency Ordered Stop   01/28/14 1700  ceFAZolin (ANCEF) IVPB 2 g/50 mL premix     2 g 100 mL/hr over 30 Minutes Intravenous 3 times per day 01/28/14 1455 01/29/14 0820      Assessment/Plan: s/p Procedure(s): HERNIA REPAIR UMBILICAL ADULT/INC (N/A) EXPLORATORY LAPAROTOMY (N/A) SMALL BOWEL RESECTION (N/A)  Will D/C NG but leave on ice chips Cr. Continues to increase.  I think he is still dry.  Will bolus IVF gently and leave foley for 24 more hours to monitor UOP  LOS: 2 days    Harl Bowie 01/30/2014

## 2014-01-30 NOTE — Evaluation (Signed)
Physical Therapy Evaluation Patient Details Name: [name] MRN: [id number] DOB: 1946/07/31 Today's Date: 01/30/2014   History of Present Illness  pt s/p  Exploratory laparotomy, repair of umbilical hernia  Clinical Impression  Pt tolerated ambulation well this date however remains deconditioned and requires assist for safe transfers. Recommend ST-SNF to achieve safe mod I function for safe transition home with family.     Follow Up Recommendations SNF    Equipment Recommendations  None recommended by PT    Recommendations for Other Services       Precautions / Restrictions Precautions Precautions: Fall Restrictions Weight Bearing Restrictions: No      Mobility  Bed Mobility Overal bed mobility: Needs Assistance Bed Mobility: Supine to Sit     Supine to sit: Max assist;HOB elevated     General bed mobility comments: used log roll technique, definite use of bed rail, assist for trunk elevation and to bring hips to EOB  Transfers Overall transfer level: Needs assistance Equipment used: Rolling walker (2 wheeled) Transfers: Sit to/from Stand Sit to Stand: Min assist         General transfer comment: v/c's for safe hand placement  Ambulation/Gait Ambulation/Gait assistance: Min assist Ambulation Distance (Feet): 75 Feet Assistive device: Rolling walker (2 wheeled) Gait Pattern/deviations: Decreased stride length;Step-through pattern;Wide base of support Gait velocity: slow but improved over time   General Gait Details: pt unsteady initially but then improved by end. Pt dependent on RW. distance limited by onset of fatigue  Stairs            Wheelchair Mobility    Modified Rankin (Stroke Patients Only)       Balance Overall balance assessment: Needs assistance Sitting-balance support: Feet supported Sitting balance-Leahy Scale: Fair     Standing balance support: Bilateral upper extremity supported Standing balance-Leahy Scale:  Poor Standing balance comment: requires use of RW                             Pertinent Vitals/Pain Reports abdominal discomfort but didn't rate.    Home Living Family/patient expects to be discharged to:: Private residence Living Arrangements: Children Available Help at Discharge: Family;Available PRN/intermittently Type of Home: House Home Access: Stairs to enter Entrance Stairs-Rails: Can reach both Entrance Stairs-Number of Steps: 3 Home Layout: One level Home Equipment: Walker - 2 wheels;Grab bars - toilet (built up commode)      Prior Function Level of Independence: Needs assistance   Gait / Transfers Assistance Needed: pt amb with RW  ADL's / Homemaking Assistance Needed: daughter assist with dressing bathing and cooking        Hand Dominance   Dominant Hand: Right    Extremity/Trunk Assessment   Upper Extremity Assessment: Generalized weakness           Lower Extremity Assessment: Generalized weakness         Communication   Communication: No difficulties  Cognition Arousal/Alertness: Awake/alert Behavior During Therapy: WFL for tasks assessed/performed Overall Cognitive Status: Impaired/Different from baseline Area of Impairment: Following commands;Safety/judgement;Problem solving       Following Commands: Follows multi-step commands with increased time Safety/Judgement: Decreased awareness of safety   Problem Solving: Slow processing;Requires verbal cues;Requires tactile cues      General Comments      Exercises        Assessment/Plan    PT Assessment Patient needs continued PT services  PT Diagnosis Difficulty walking;Generalized weakness   PT Problem  List Decreased strength;Decreased activity tolerance;Decreased balance  PT Treatment Interventions DME instruction;Gait training;Stair training;Functional mobility training;Therapeutic activities;Therapeutic exercise   PT Goals (Current goals can be found in the Care Plan  section) Acute Rehab PT Goals Patient Stated Goal: rehab PT Goal Formulation: With patient Time For Goal Achievement: 02/06/14 Potential to Achieve Goals: Good    Frequency Min 3X/week   Barriers to discharge        Co-evaluation               End of Session Equipment Utilized During Treatment: Gait belt Activity Tolerance: Patient tolerated treatment well Patient left: in chair;with call bell/phone within reach Nurse Communication: Mobility status         Time: 2633-3545 PT Time Calculation (min): 21 min   Charges:   PT Evaluation $Initial PT Evaluation Tier I: 1 Procedure PT Treatments $Gait Training: 8-22 mins   PT G CodesBerline Lopes 01/30/2014, 3:47 PM  [name], PT, DPT Pager #: [phone] Office #: [phone]

## 2014-01-30 NOTE — Progress Notes (Signed)
PROGRESS NOTE    [name] IRC:[id number] DOB: 1945/10/20 DOA: 01/28/2014 PCP: Jene Every, MD  HPI/Brief narrative 68 y.o. male with Past medical history of COPD, sleep apnea, peripheral vascular disease, coronary artery disease, diabetes mellitus, hypertension, hypothyroidism, atrial fibrillation, chronic diastolic heart failure presented with abdominal pain, nausea and vomiting. He was diagnosed with strangulated umbilical hernia and underwent surgery for same on 01/28/14.   Assessment/Plan:  1. Strangulated umbilical hernia:  -Status post hernia repair by surgery on 4/20. -NG tube discontinued on 01/30/14 -Remains NPO except for ice chips.   2. Acute kidney injury -Likely secondary to prerenal etiology, as he has been n.p.o. for small bowel obstruction. -Creatinine increasing to 1.92 from 1.36 on 01/29/2014. -He was given a bolus of 1 L of normal saline, maintenance fluids restarted at 75 mL per hour -Repeat BMP in a.m.  3. Type II DM:  -Continue Accu-Cheks q. a.c. and each bedtime with sliding scale coverage -Blood sugars controlled  4. Atrial fibrillation:  -Remains rate controlled, heart rates in the 70s to 90s - Continue Coreg  5. Chronic diastolic CHF -Does not have clinical evidence of acute CHF, we'll continue close monitoring as he is receiving IV fluids  6. Hypothyroidism: Continue Synthroid   Code Status: Full Family Communication: None at bedside Disposition Plan: Transfer to telemetry   Consultants:  General surgery  Procedures:  Exploratory laparotomy, small bowel resection and umbilical hernia repair  NG tube  Foley catheter  Antibiotics:    Subjective: Patient states he'll do better today, NG tube was discontinued this morning. Reports mild generalized abdominal pain  Objective: Filed Vitals:   01/29/14 2229 01/30/14 0418 01/30/14 0900 01/30/14 1342  BP: 119/62 114/59 162/94 118/89  Pulse: 81 91 98 83  Temp: 97.8 F (36.6 C)  98.2 F (36.8 C) 97.4 F (36.3 C) 97.8 F (36.6 C)  TempSrc: Oral Oral Oral Oral  Resp: 18 18 18 18   Height:      Weight:  107.4 kg (236 lb 12.4 oz)    SpO2: 96% 96% 96% 98%    Intake/Output Summary (Last 24 hours) at 01/30/14 1559 Last data filed at 01/30/14 1300  Gross per 24 hour  Intake    528 ml  Output   1550 ml  Net  -1022 ml   Filed Weights   01/28/14 0346 01/28/14 1448 01/30/14 0418  Weight: 105.733 kg (233 lb 1.6 oz) 108.4 kg (238 lb 15.7 oz) 107.4 kg (236 lb 12.4 oz)     Exam:  General exam: Pleasant elderly male lying comfortably in bed. Respiratory system: Clear. No increased work of breathing. Cardiovascular system: S1 & S2 heard, irregularly irregular. No JVD, murmurs, gallops, clicks or pedal edema. Telemetry: Atrial fibrillation with controlled ventricular rate. Gastrointestinal system: Abdomen is nondistended, soft and nontender. Laparotomy site clean and dry. Normal bowel sounds heard. Central nervous system: Alert and oriented. No focal neurological deficits. Extremities: Symmetric 5 x 5 power.   Data Reviewed: Basic Metabolic Panel:  Recent Labs Lab 01/28/14 0535 01/29/14 0240 01/30/14 0500  NA 141 144 147  K 3.5* 3.8 3.8  CL 92* 96 99  CO2 33* 33* 32  GLUCOSE 211* 161* 120*  BUN 90* 87* 96*  CREATININE 1.06 1.36* 1.92*  CALCIUM 10.3 10.1 9.8  MG 1.7  --   --    Liver Function Tests:  Recent Labs Lab 01/28/14 0535  AST 17  ALT 10  ALKPHOS 81  BILITOT 0.5  PROT 8.0  ALBUMIN  3.4*   No results found for this basename: LIPASE, AMYLASE,  in the last 168 hours No results found for this basename: AMMONIA,  in the last 168 hours CBC:  Recent Labs Lab 01/28/14 0535 01/29/14 0240 01/30/14 0500  WBC 9.3 8.5 7.4  NEUTROABS 7.4  --   --   HGB 13.2 12.2* 11.1*  HCT 41.0 38.9* 36.4*  MCV 88.9 91.7 93.6  PLT 164 152 148*   Cardiac Enzymes: No results found for this basename: CKTOTAL, CKMB, CKMBINDEX, TROPONINI,  in the last 168  hours BNP (last 3 results)  Recent Labs  04/14/13 1301 08/16/13 1351 01/17/14 1541  PROBNP 6847.0* 6560.0* 4873.0*   CBG:  Recent Labs Lab 01/30/14 0012 01/30/14 0413 01/30/14 0727 01/30/14 0823 01/30/14 1123  GLUCAP 125* 116* 120* 117* 117*    Recent Results (from the past 240 hour(s))  SURGICAL PCR SCREEN     Status: None   Collection Time    01/28/14  8:13 AM      Result Value Ref Range Status   MRSA, PCR NEGATIVE  NEGATIVE Final   Staphylococcus aureus NEGATIVE  NEGATIVE Final   Comment:            The Xpert SA Assay (FDA     approved for NASAL specimens     in patients over 12 years of age),     is one component of     a comprehensive surveillance     program.  Test performance has     been validated by [employer] for patients greater     than or equal to 35 year old.     It is not intended     to diagnose infection nor to     guide or monitor treatment.       Studies: No results found.      Scheduled Meds: . aspirin  81 mg Per J Tube Daily  . carvedilol  3.125 mg Per Tube BID  . FLUoxetine  20 mg Per Tube Daily  . fluticasone  1 puff Inhalation BID  . heparin  5,000 Units Subcutaneous 3 times per day  . insulin aspart  0-9 Units Subcutaneous 6 times per day  . levothyroxine  150 mcg Per NG tube QAC breakfast  . sodium chloride  3 mL Intravenous Q12H   Continuous Infusions: . sodium chloride 500 mL (01/30/14 0950)    Principal Problem:   SBO (small bowel obstruction) Active Problems:   HYPERTENSION   DIASTOLIC HEART FAILURE, CHRONIC   CAROTID STENOSIS   CHRONIC OBSTRUCTIVE PULMONARY DISEASE   Atrial fibrillation   Hypothyroidism   Diabetes mellitus   Acute kidney injury    Time spent: 40 minutes.    Kelvin Cellar, MD, FACP, [facility]. [facility] Pager [phone]  If 7PM-7AM, please contact night-coverage [email or web address] Password [email or web address] 01/30/2014, 3:59 PM    LOS: 2 days

## 2014-01-31 ENCOUNTER — Encounter (HOSPITAL_COMMUNITY): Payer: Self-pay | Admitting: Surgery

## 2014-01-31 DIAGNOSIS — R5383 Other fatigue: Secondary | ICD-10-CM

## 2014-01-31 DIAGNOSIS — R5381 Other malaise: Secondary | ICD-10-CM

## 2014-01-31 LAB — CBC
HCT: 37.5 % — ABNORMAL LOW (ref 39.0–52.0)
Hemoglobin: 11.5 g/dL — ABNORMAL LOW (ref 13.0–17.0)
MCH: 28.5 pg (ref 26.0–34.0)
MCHC: 30.7 g/dL (ref 30.0–36.0)
MCV: 93.1 fL (ref 78.0–100.0)
Platelets: 152 10*3/uL (ref 150–400)
RBC: 4.03 MIL/uL — ABNORMAL LOW (ref 4.22–5.81)
RDW: 16.7 % — ABNORMAL HIGH (ref 11.5–15.5)
WBC: 6.9 10*3/uL (ref 4.0–10.5)

## 2014-01-31 LAB — GLUCOSE, CAPILLARY
GLUCOSE-CAPILLARY: 98 mg/dL (ref 70–99)
GLUCOSE-CAPILLARY: 182 mg/dL — AB (ref 70–99)
GLUCOSE-CAPILLARY: 164 mg/dL — AB (ref 70–99)
Glucose-Capillary: 94 mg/dL (ref 70–99)
Glucose-Capillary: 149 mg/dL — ABNORMAL HIGH (ref 70–99)

## 2014-01-31 LAB — BASIC METABOLIC PANEL
BUN: 88 mg/dL — ABNORMAL HIGH (ref 6–23)
CO2: 32 mEq/L (ref 19–32)
Calcium: 9.9 mg/dL (ref 8.4–10.5)
Chloride: 98 mEq/L (ref 96–112)
Creatinine, Ser: 1.6 mg/dL — ABNORMAL HIGH (ref 0.50–1.35)
GFR calc Af Amer: 49 mL/min — ABNORMAL LOW (ref >90)
GFR, EST NON AFRICAN AMERICAN: 42 mL/min — AB (ref >90)
Glucose, Bld: 100 mg/dL — ABNORMAL HIGH (ref 70–99)
Potassium: 4.4 mEq/L (ref 3.7–5.3)
SODIUM: 146 meq/L (ref 137–147)

## 2014-01-31 MED ORDER — GABAPENTIN 300 MG PO CAPS
300.0000 mg | ORAL_CAPSULE | Freq: Two times a day (BID) | ORAL | Status: DC
  Administered 2014-01-31 – 2014-02-05 (×10): 300 mg via ORAL
  Filled 2014-01-31 (×12): qty 1

## 2014-01-31 MED ORDER — INSULIN ASPART 100 UNIT/ML ~~LOC~~ SOLN
0.0000 [IU] | Freq: Three times a day (TID) | SUBCUTANEOUS | Status: DC
  Administered 2014-01-31: 2 [IU] via SUBCUTANEOUS
  Administered 2014-01-31: 1 [IU] via SUBCUTANEOUS
  Administered 2014-02-01 (×2): 2 [IU] via SUBCUTANEOUS
  Administered 2014-02-02: 1 [IU] via SUBCUTANEOUS
  Administered 2014-02-02 – 2014-02-04 (×3): 2 [IU] via SUBCUTANEOUS
  Administered 2014-02-04: 1 [IU] via SUBCUTANEOUS
  Administered 2014-02-04: 2 [IU] via SUBCUTANEOUS
  Administered 2014-02-05 (×2): 1 [IU] via SUBCUTANEOUS

## 2014-01-31 MED ORDER — INSULIN ASPART 100 UNIT/ML ~~LOC~~ SOLN
0.0000 [IU] | Freq: Every day | SUBCUTANEOUS | Status: DC
  Administered 2014-02-04: 2 [IU] via SUBCUTANEOUS

## 2014-01-31 NOTE — Progress Notes (Signed)
PROGRESS NOTE    [name] TFT:[id number] DOB: 1946/06/29 DOA: 01/28/2014 PCP: Jene Every, MD  HPI/Brief narrative 68 y.o. male with Past medical history of COPD, sleep apnea, peripheral vascular disease, coronary artery disease, diabetes mellitus, hypertension, hypothyroidism, atrial fibrillation, chronic diastolic heart failure presented with abdominal pain, nausea and vomiting. He was diagnosed with strangulated umbilical hernia and underwent surgery for same on 01/28/14.   Assessment/Plan:  1. Strangulated umbilical hernia:  -Status post hernia repair by surgery on 4/20. -NG tube discontinued on 01/30/14 -Appears better today, diet advanced, tolerating PO  2. Acute kidney injury -Likely secondary to prerenal etiology, as he has been n.p.o. for small bowel obstruction. -Creatinine increasing to 1.92 from 1.36 on 01/29/2014. -He was given a bolus of 1 L of normal saline, maintenance fluids restarted at 75 mL per hour -Repeat BMP on 01/31/14 showing improvement with downward trend in Cr to 1.6  3. Type II DM:  -Continue Accu-Cheks q. a.c. and each bedtime with sliding scale coverage -Blood sugars controlled  4. Atrial fibrillation:  -Remains rate controlled, heart rates in the 70s to 90s - Continue Coreg  5. Chronic diastolic CHF -Does not have clinical evidence of acute CHF  6. Hypothyroidism: Continue Synthroid   Code Status: Full Family Communication: None at bedside Disposition Plan: Transfer to telemetry   Consultants:  General surgery  Procedures:  Exploratory laparotomy, small bowel resection and umbilical hernia repair  NG tube  Foley catheter  Antibiotics:    Subjective: Patient seems to be doing better today, tolerating clears, denies N/V, abdominal pain improved.   Objective: Filed Vitals:   01/31/14 0500 01/31/14 0504 01/31/14 0916 01/31/14 1407  BP:  148/75  135/77  Pulse:  68  80  Temp:  97.7 F (36.5 C)  98.3 F (36.8 C)    TempSrc:  Oral  Oral  Resp:  17  18  Height:      Weight: 105.9 kg (233 lb 7.5 oz)     SpO2:  97% 97% 96%    Intake/Output Summary (Last 24 hours) at 01/31/14 1411 Last data filed at 01/31/14 1407  Gross per 24 hour  Intake      0 ml  Output   2625 ml  Net  -2625 ml   Filed Weights   01/28/14 1448 01/30/14 0418 01/31/14 0500  Weight: 108.4 kg (238 lb 15.7 oz) 107.4 kg (236 lb 12.4 oz) 105.9 kg (233 lb 7.5 oz)     Exam:  General exam: No acute distress Respiratory system: Clear. No increased work of breathing. Cardiovascular system: S1 & S2 heard, irregularly irregular. No JVD, murmurs, gallops, clicks or pedal edema. Telemetry: Atrial fibrillation with controlled ventricular rate. Gastrointestinal system: Abdomen is nondistended, soft and nontender. Laparotomy site clean and dry. Normal bowel sounds heard. Central nervous system: Alert and oriented. No focal neurological deficits. Extremities: Symmetric 5 x 5 power.   Data Reviewed: Basic Metabolic Panel:  Recent Labs Lab 01/28/14 0535 01/29/14 0240 01/30/14 0500 01/31/14 0410  NA 141 144 147 146  K 3.5* 3.8 3.8 4.4  CL 92* 96 99 98  CO2 33* 33* 32 32  GLUCOSE 211* 161* 120* 100*  BUN 90* 87* 96* 88*  CREATININE 1.06 1.36* 1.92* 1.60*  CALCIUM 10.3 10.1 9.8 9.9  MG 1.7  --   --   --    Liver Function Tests:  Recent Labs Lab 01/28/14 0535  AST 17  ALT 10  ALKPHOS 81  BILITOT 0.5  PROT  8.0  ALBUMIN 3.4*   No results found for this basename: LIPASE, AMYLASE,  in the last 168 hours No results found for this basename: AMMONIA,  in the last 168 hours CBC:  Recent Labs Lab 01/28/14 0535 01/29/14 0240 01/30/14 0500 01/31/14 0410  WBC 9.3 8.5 7.4 6.9  NEUTROABS 7.4  --   --   --   HGB 13.2 12.2* 11.1* 11.5*  HCT 41.0 38.9* 36.4* 37.5*  MCV 88.9 91.7 93.6 93.1  PLT 164 152 148* 152   Cardiac Enzymes: No results found for this basename: CKTOTAL, CKMB, CKMBINDEX, TROPONINI,  in the last 168  hours BNP (last 3 results)  Recent Labs  04/14/13 1301 08/16/13 1351 01/17/14 1541  PROBNP 6847.0* 6560.0* 4873.0*   CBG:  Recent Labs Lab 01/30/14 1551 01/30/14 1951 01/30/14 2320 01/31/14 0505 01/31/14 0812  GLUCAP 117* 141* 112* 98 94    Recent Results (from the past 240 hour(s))  SURGICAL PCR SCREEN     Status: None   Collection Time    01/28/14  8:13 AM      Result Value Ref Range Status   MRSA, PCR NEGATIVE  NEGATIVE Final   Staphylococcus aureus NEGATIVE  NEGATIVE Final   Comment:            The Xpert SA Assay (FDA     approved for NASAL specimens     in patients over 35 years of age),     is one component of     a comprehensive surveillance     program.  Test performance has     been validated by [employer] for patients greater     than or equal to 42 year old.     It is not intended     to diagnose infection nor to     guide or monitor treatment.       Studies: No results found.      Scheduled Meds: . aspirin  81 mg Per J Tube Daily  . carvedilol  3.125 mg Per Tube BID  . FLUoxetine  20 mg Per Tube Daily  . fluticasone  1 puff Inhalation BID  . gabapentin  300 mg Oral BID  . heparin  5,000 Units Subcutaneous 3 times per day  . insulin aspart  0-5 Units Subcutaneous QHS  . insulin aspart  0-9 Units Subcutaneous TID WC  . levothyroxine  150 mcg Per NG tube QAC breakfast  . sodium chloride  3 mL Intravenous Q12H   Continuous Infusions:    Principal Problem:   SBO (small bowel obstruction) Active Problems:   HYPERTENSION   DIASTOLIC HEART FAILURE, CHRONIC   CAROTID STENOSIS   CHRONIC OBSTRUCTIVE PULMONARY DISEASE   Atrial fibrillation   Hypothyroidism   Diabetes mellitus   Acute kidney injury    Time spent: 30 minutes.    Kelvin Cellar, MD, FACP, [facility]. [facility] Pager [phone]  If 7PM-7AM, please contact night-coverage [email or web address] Password [email or web address] 01/31/2014, 2:11 PM    LOS: 3 days

## 2014-01-31 NOTE — Progress Notes (Signed)
CSW informed patients daughter over the phone about PT's recommendation. Patient's daughter explained that patient has been to [facility] before and that would be her preference if he had to go to one. CSW will follow up with patient's daughter tomorrow morning.  Jeanette Caprice, MSW, [place]

## 2014-01-31 NOTE — Progress Notes (Signed)
Clinical Social Work Department BRIEF PSYCHOSOCIAL ASSESSMENT 01/31/2014  Patient:  [name], [name]     Account Number:  [id number]     Admit date:  01/28/2014  Clinical Social Worker:  Megan Salon  Date/Time:  01/31/2014 10:55 AM  Referred by:  Care Management  Date Referred:  01/31/2014 Referred for  SNF Placement   Other Referral:   Interview type:  Patient Other interview type:    PSYCHOSOCIAL DATA Living Status:  ALONE Admitted from facility:   Level of care:   Primary support name:  Darrold Junker Primary support relationship to patient:  CHILD, ADULT Degree of support available:   Good    CURRENT CONCERNS Current Concerns  Post-Acute Placement   Other Concerns:    SOCIAL WORK ASSESSMENT / PLAN Clinical Social Worker received referral for SNF placement at d/c. CSW introduced self and explained reason for visit. CSW explained SNF process and provided SNF packet to patient.  Patient reported he is agreeable for SNF placement and has been to [facility] before. Patient gave permission to call patient's daughter. Patient was in good spirits during visit and states he feels like he will only need a little time at rehab, but is still considering going home because he doesn't know if SNF will "be too much for his hernia". . CSW will complete FL2 for MD's signature and will update patient and family when bed offers are received.   Assessment/plan status:  Psychosocial Support/Ongoing Assessment of Needs Other assessment/ plan:   Information/referral to community resources:   SNF list    PATIENT'S/FAMILY'S RESPONSE TO PLAN OF CARE: Patient was agreeable to be faxed out to SNF and would like social worker to follow up with patient's daughter.        Jeanette Caprice, MSW, [place]

## 2014-01-31 NOTE — Progress Notes (Signed)
3 Days Post-Op  Subjective: Pt reports passing a lot of flatus  Objective: Vital signs in last 24 hours: Temp:  [97.4 F (36.3 C)-97.8 F (36.6 C)] 97.7 F (36.5 C) (04/23 0504) Pulse Rate:  [68-98] 68 (04/23 0504) Resp:  [17-18] 17 (04/23 0504) BP: (118-162)/(60-94) 148/75 mmHg (04/23 0504) SpO2:  [96 %-98 %] 97 % (04/23 0504) Weight:  [233 lb 7.5 oz (105.9 kg)] 233 lb 7.5 oz (105.9 kg) (04/23 0500) Last BM Date: 01/26/14  Intake/Output from previous day: 04/22 0701 - 04/23 0700 In: 0  Out: 2400 [Urine:2325; Emesis/NG output:75] Intake/Output this shift:    Lungs still clear Abdomen soft, incision clean, good BS  Lab Results:   Recent Labs  01/30/14 0500 01/31/14 0410  WBC 7.4 6.9  HGB 11.1* 11.5*  HCT 36.4* 37.5*  PLT 148* 152   BMET  Recent Labs  01/30/14 0500 01/31/14 0410  NA 147 146  K 3.8 4.4  CL 99 98  CO2 32 32  GLUCOSE 120* 100*  BUN 96* 88*  CREATININE 1.92* 1.60*  CALCIUM 9.8 9.9   PT/INR No results found for this basename: LABPROT, INR,  in the last 72 hours ABG No results found for this basename: PHART, PCO2, PO2, HCO3,  in the last 72 hours  Studies/Results: No results found.  Anti-infectives: Anti-infectives   Start     Dose/Rate Route Frequency Ordered Stop   01/28/14 1700  ceFAZolin (ANCEF) IVPB 2 g/50 mL premix     2 g 100 mL/hr over 30 Minutes Intravenous 3 times per day 01/28/14 1455 01/29/14 0820      Assessment/Plan: s/p Procedure(s): HERNIA REPAIR UMBILICAL ADULT/INC (N/A) EXPLORATORY LAPAROTOMY (N/A) SMALL BOWEL RESECTION (N/A)  Start clear liquids D/c foley  LOS: 3 days    Harl Bowie 01/31/2014

## 2014-01-31 NOTE — Progress Notes (Signed)
Clinical Social Work Department CLINICAL SOCIAL WORK PLACEMENT NOTE 01/31/2014  Patient:  [name], [name]  Account Number:  [id number] Admit date:  01/28/2014  Clinical Social Worker:  Megan Salon  Date/time:  01/31/2014 10:56 AM  Clinical Social Work is seeking post-discharge placement for this patient at the following level of care:   [place]   (*CSW will update this form in Epic as items are completed)   01/31/2014  Patient/family provided with [place] Department of Clinical Social Work's list of facilities offering this level of care within the geographic area requested by the patient (or if unable, by the patient's family).  01/31/2014  Patient/family informed of their freedom to choose among providers that offer the needed level of care, that participate in Medicare, Medicaid or managed care program needed by the patient, have an available bed and are willing to accept the patient.  01/31/2014  Patient/family informed of [facility]' ownership interest in [facility], as well as of the fact that they are under no obligation to receive care at this facility.  PASARR submitted to EDS on  PASARR number received from [place] on   FL2 transmitted to all facilities in geographic area requested by pt/family on  01/31/2014 FL2 transmitted to all facilities within larger geographic area on   Patient informed that his/her managed care company has contracts with or will negotiate with  certain facilities, including the following:     Patient/family informed of bed offers received:   Patient chooses bed at  Physician recommends and patient chooses bed at    Patient to be transferred to  on   Patient to be transferred to facility by   The following physician request were entered in Epic:   Additional Comments: Patient has previous [place], MSW, [place]

## 2014-02-01 DIAGNOSIS — I4891 Unspecified atrial fibrillation: Secondary | ICD-10-CM

## 2014-02-01 DIAGNOSIS — E119 Type 2 diabetes mellitus without complications: Secondary | ICD-10-CM

## 2014-02-01 DIAGNOSIS — K56609 Unspecified intestinal obstruction, unspecified as to partial versus complete obstruction: Secondary | ICD-10-CM

## 2014-02-01 DIAGNOSIS — N179 Acute kidney failure, unspecified: Secondary | ICD-10-CM

## 2014-02-01 LAB — BASIC METABOLIC PANEL
BUN: 63 mg/dL — ABNORMAL HIGH (ref 6–23)
CO2: 34 meq/L — AB (ref 19–32)
Calcium: 9.6 mg/dL (ref 8.4–10.5)
Chloride: 96 mEq/L (ref 96–112)
Creatinine, Ser: 1.14 mg/dL (ref 0.50–1.35)
GFR calc Af Amer: 73 mL/min — ABNORMAL LOW (ref >90)
GFR, EST NON AFRICAN AMERICAN: 63 mL/min — AB (ref >90)
Glucose, Bld: 128 mg/dL — ABNORMAL HIGH (ref 70–99)
POTASSIUM: 3.9 meq/L (ref 3.7–5.3)
SODIUM: 142 meq/L (ref 137–147)

## 2014-02-01 LAB — GLUCOSE, CAPILLARY
GLUCOSE-CAPILLARY: 119 mg/dL — AB (ref 70–99)
Glucose-Capillary: 165 mg/dL — ABNORMAL HIGH (ref 70–99)
Glucose-Capillary: 159 mg/dL — ABNORMAL HIGH (ref 70–99)
Glucose-Capillary: 194 mg/dL — ABNORMAL HIGH (ref 70–99)

## 2014-02-01 LAB — CBC
HCT: 36.2 % — ABNORMAL LOW (ref 39.0–52.0)
HEMOGLOBIN: 11.4 g/dL — AB (ref 13.0–17.0)
MCH: 28.7 pg (ref 26.0–34.0)
MCHC: 31.5 g/dL (ref 30.0–36.0)
MCV: 91.2 fL (ref 78.0–100.0)
Platelets: 143 10*3/uL — ABNORMAL LOW (ref 150–400)
RBC: 3.97 MIL/uL — AB (ref 4.22–5.81)
RDW: 16.3 % — ABNORMAL HIGH (ref 11.5–15.5)
WBC: 6 10*3/uL (ref 4.0–10.5)

## 2014-02-01 MED ORDER — ENSURE COMPLETE PO LIQD
237.0000 mL | Freq: Two times a day (BID) | ORAL | Status: DC
  Administered 2014-02-01 – 2014-02-05 (×6): 237 mL via ORAL

## 2014-02-01 MED ORDER — BISACODYL 5 MG PO TBEC
5.0000 mg | DELAYED_RELEASE_TABLET | Freq: Every day | ORAL | Status: DC | PRN
  Administered 2014-02-02: 5 mg via ORAL
  Filled 2014-02-01: qty 1

## 2014-02-01 NOTE — Progress Notes (Signed)
4 Days Post-Op  Subjective: Tolerated clear liquids Passing flatus  Objective: Vital signs in last 24 hours: Temp:  [97.8 F (36.6 C)-98.9 F (37.2 C)] 97.8 F (36.6 C) (04/24 0446) Pulse Rate:  [72-81] 81 (04/24 0446) Resp:  [18] 18 (04/24 0446) BP: (135-150)/(77-84) 150/84 mmHg (04/24 0446) SpO2:  [94 %-97 %] 95 % (04/24 0446) Weight:  [235 lb 3.7 oz (106.7 kg)] 235 lb 3.7 oz (106.7 kg) (04/24 0446) Last BM Date: 01/26/14  Intake/Output from previous day: 04/23 0701 - 04/24 0700 In: 240 [P.O.:240] Out: 2000 [Urine:2000] Intake/Output this shift:    Abdomen soft, incision clean  Lab Results:   Recent Labs  01/31/14 0410 02/01/14 0432  WBC 6.9 6.0  HGB 11.5* 11.4*  HCT 37.5* 36.2*  PLT 152 143*   BMET  Recent Labs  01/31/14 0410 02/01/14 0432  NA 146 142  K 4.4 3.9  CL 98 96  CO2 32 34*  GLUCOSE 100* 128*  BUN 88* 63*  CREATININE 1.60* 1.14  CALCIUM 9.9 9.6   PT/INR No results found for this basename: LABPROT, INR,  in the last 72 hours ABG No results found for this basename: PHART, PCO2, PO2, HCO3,  in the last 72 hours  Studies/Results: No results found.  Anti-infectives: Anti-infectives   Start     Dose/Rate Route Frequency Ordered Stop   01/28/14 1700  ceFAZolin (ANCEF) IVPB 2 g/50 mL premix     2 g 100 mL/hr over 30 Minutes Intravenous 3 times per day 01/28/14 1455 01/29/14 0820      Assessment/Plan: s/p Procedure(s): HERNIA REPAIR UMBILICAL ADULT/INC (N/A) EXPLORATORY LAPAROTOMY (N/A) SMALL BOWEL RESECTION (N/A)  Will advance to full liquid diet  LOS: 4 days    Harl Bowie 02/01/2014

## 2014-02-01 NOTE — Progress Notes (Signed)
Physical Therapy Treatment Patient Details Name: [name] MRN: [id number] DOB: Jun 08, 1946 Today's Date: Feb 25, 2014    History of Present Illness pt s/p  Exploratory laparotomy, repair of umbilical hernia    PT Comments    Pt making steady progress.  Follow Up Recommendations  SNF     Equipment Recommendations  None recommended by PT    Recommendations for Other Services       Precautions / Restrictions Precautions Precautions: Fall    Mobility  Bed Mobility Overal bed mobility: Needs Assistance Bed Mobility: Sit to Supine       Sit to supine: +2 for physical assistance;Mod assist   General bed mobility comments: Assist to lower trunk and to bring legs up.  Transfers Overall transfer level: Needs assistance Equipment used: Rolling walker (2 wheeled) Transfers: Sit to/from Stand Sit to Stand: Supervision         General transfer comment: for safety  Ambulation/Gait Ambulation/Gait assistance: Min guard Ambulation Distance (Feet): 150 Feet Assistive device: Rolling walker (2 wheeled) Gait Pattern/deviations: Step-through pattern;Decreased step length - right;Decreased step length - left;Trunk flexed Gait velocity: decr Gait velocity interpretation: Below normal speed for age/gender General Gait Details: Verbal cues to stand more erect.   Stairs            Wheelchair Mobility    Modified Rankin (Stroke Patients Only)       Balance   Sitting-balance support: No upper extremity supported;Feet supported Sitting balance-Leahy Scale: Fair     Standing balance support: Single extremity supported Standing balance-Leahy Scale: Poor Standing balance comment: at sink to wash hands required 1 arm support                    Cognition Arousal/Alertness: Awake/alert Behavior During Therapy: WFL for tasks assessed/performed Overall Cognitive Status: Within Functional Limits for tasks assessed                      Exercises       General Comments        Pertinent Vitals/Pain Incisional soreness    Home Living                      Prior Function            PT Goals (current goals can now be found in the care plan section) Progress towards PT goals: Progressing toward goals    Frequency  Min 3X/week    PT Plan Current plan remains appropriate    Co-evaluation             End of Session   Activity Tolerance: Patient tolerated treatment well Patient left: in bed;with call bell/phone within reach;with bed alarm set     Time: 1345-1400 PT Time Calculation (min): 15 min  Charges:                       G CodesShary Decamp Oletta Buehring 25-Feb-2014, 2:47 PM  [employer] PT [phone]

## 2014-02-01 NOTE — Progress Notes (Signed)
NUTRITION FOLLOW UP  Intervention: Ensure Complete po BID, each supplement provides 350 kcal and 13 grams of protein RD to follow for nutrition care plan  New Nutrition Dx: Increased nutrient needs related to post-op healing as evidenced by estimated nutrition needs, ongoing  Goal: Pt to meet >/= 90% of their estimated nutrition needs, progressing  Monitor:  PO & supplemental intake, weight, labs, I/O's  ASSESSMENT: 68 y.o. male with PMH of COPD, sleep apnea, peripheral vascular disease, coronary artery disease, diabetes mellitus, hypertension, hypothyroidism, atrial fibrillation, chronic diastolic heart failure.   Presented with complaints of abdominal pain that has been ongoing since last Friday. This is associated with persistent nausea and multiple episodes of vomiting without any blood.   Patient s/p procedures 4/20: HERNIA REPAIR UMBILICAL ADULT/INC  EXPLORATORY LAPAROTOMY  SMALL BOWEL RESECTION  Patient advanced to Clear Liquids 4/23, Full Liquids this AM.   States his appetite is pretty good.  NGT discontinued 4/22.  Passing flatus.  PO intake 100% per flowsheet records.  Would benefit from addition of nutrition supplements.  RD to order.  Height: Ht Readings from Last 1 Encounters:  01/28/14 5\' 10"  (1.778 m)    Weight: Wt Readings from Last 1 Encounters:  02/01/14 235 lb 3.7 oz (106.7 kg)    BMI:  Body mass index is 33.75 kg/(m^2).  Estimated Nutritional Needs: Kcal: 2100-2300 Protein: 110-120 gm Fluid: 2.1-2.3 L  Skin: Intact  Diet Order: Full Liquid   Intake/Output Summary (Last 24 hours) at 02/01/14 1301 Last data filed at 02/01/14 0834  Gross per 24 hour  Intake    600 ml  Output   1400 ml  Net   -800 ml    Labs:   Recent Labs Lab 01/28/14 0535  01/30/14 0500 01/31/14 0410 02/01/14 0432  NA 141  < > 147 146 142  K 3.5*  < > 3.8 4.4 3.9  CL 92*  < > 99 98 96  CO2 33*  < > 32 32 34*  BUN 90*  < > 96* 88* 63*  CREATININE 1.06  < > 1.92*  1.60* 1.14  CALCIUM 10.3  < > 9.8 9.9 9.6  MG 1.7  --   --   --   --   GLUCOSE 211*  < > 120* 100* 128*  < > = values in this interval not displayed.  CBG (last 3)   Recent Labs  01/31/14 2013 02/01/14 0531 02/01/14 1131  GLUCAP 164* 119* 165*    Scheduled Meds: . aspirin  81 mg Per J Tube Daily  . carvedilol  3.125 mg Per Tube BID  . FLUoxetine  20 mg Per Tube Daily  . fluticasone  1 puff Inhalation BID  . gabapentin  300 mg Oral BID  . heparin  5,000 Units Subcutaneous 3 times per day  . insulin aspart  0-5 Units Subcutaneous QHS  . insulin aspart  0-9 Units Subcutaneous TID WC  . levothyroxine  150 mcg Per NG tube QAC breakfast  . sodium chloride  3 mL Intravenous Q12H    Continuous Infusions:    Past Medical History  Diagnosis Date  . CHRONIC OBSTRUCTIVE PULMONARY DISEASE 06/20/2009  . OBSTRUCTIVE SLEEP APNEA 06/20/2009  . CAROTID STENOSIS 06/20/2009    A. 08/2001 s/p L CEA;  B.   09/14/11 - Carotid U/S - 40-59% bilateral stenosis, left CEA patch angioplasty is patent  . DM 06/20/2009  . CAD 06/20/2009    A.  08/2000 - s/p CABG x 4 -  LIMA-LAD, Left Radial-OM, VG-DIAG, VG-RCA;  B. Neg. MV  2010  . HYPERLIPIDEMIA 06/20/2009  . HYPERTENSION 06/20/2009  . Hypothyroidism   . Low back pain   . Asthma     as child  . Pneumonia   . Atrial fibrillation     Not felt to be coumadin candidate 2/2 ETOH use.  Marland Kitchen ETOH abuse   . History of tobacco abuse     remote - quit 1970  . Bilateral renal cysts   . Marijuana abuse   . Morbidly obese   . CHF (congestive heart failure)   . Falls frequently     Past Surgical History  Procedure Laterality Date  . Carotid endarterectomy  2002    left  . Coronary artery bypass graft      x 4 - 2001  . Umbilical hernia repair N/A 01/28/2014    Procedure: HERNIA REPAIR UMBILICAL ADULT/INC;  Surgeon: Harl Bowie, MD;  Location: [place];  Service: General;  Laterality: N/A;  . Laparotomy N/A 01/28/2014    Procedure: EXPLORATORY  LAPAROTOMY;  Surgeon: Harl Bowie, MD;  Location: [place];  Service: General;  Laterality: N/A;  . Bowel resection N/A 01/28/2014    Procedure: SMALL BOWEL RESECTION;  Surgeon: Harl Bowie, MD;  Location: [place];  Service: General;  Laterality: N/A;    Arthur Holms, RD, LDN Pager #: [phone] After-Hours Pager #: [phone]

## 2014-02-01 NOTE — Progress Notes (Signed)
PROGRESS NOTE    [name] IRC:[id number] DOB: May 02, 1946 DOA: 01/28/2014 PCP: Jene Every, MD  HPI/Brief narrative 68 y.o. male with Past medical history of COPD, sleep apnea, peripheral vascular disease, coronary artery disease, diabetes mellitus, hypertension, hypothyroidism, atrial fibrillation, chronic diastolic heart failure presented with abdominal pain, nausea and vomiting. He was diagnosed with strangulated umbilical hernia and underwent surgery for same on 01/28/14.   Assessment/Plan:  1. Strangulated umbilical hernia:  -Status post hernia repair by surgery on 4/20. -NG tube discontinued on 01/30/14 -He reports passing flatus -Diet advanced to Full liquids  2. Acute kidney injury -Likely secondary to prerenal etiology, as he has been n.p.o. for small bowel obstruction. -Creatinine increasing to 1.92 from 1.36 on 01/29/2014. -He was given a bolus of 1 L of normal saline, maintenance fluids restarted at 75 mL per hour -Repeat BMP on 02/01/14 showing improvement to kidney function, with Creatinine coming down to 1.14  3. Type II DM:  -Continue Accu-Cheks q. a.c. and each bedtime with sliding scale coverage -Blood sugars controlled  4. Atrial fibrillation:  -Remains rate controlled, heart rates in the 70s to 90s - Continue Coreg  5. Chronic diastolic CHF -Does not have clinical evidence of acute CHF  6. Hypothyroidism: Continue Synthroid   Code Status: Full Family Communication: Spoke with patient Disposition Plan: Transfer to telemetry   Consultants:  General surgery  Procedures:  Exploratory laparotomy, small bowel resection and umbilical hernia repair  NG tube  Foley catheter  Antibiotics:    Subjective: Patient states passing flatus, tolerating clears, advanced to full liquid diet.   Objective: Filed Vitals:   01/31/14 2019 02/01/14 0446 02/01/14 0937 02/01/14 1402  BP: 142/80 150/84  135/72  Pulse: 72 81  80  Temp: 98.9 F (37.2 C)  97.8 F (36.6 C)  97.3 F (36.3 C)  TempSrc: Oral Oral  Oral  Resp: 18 18  18   Height:      Weight:  106.7 kg (235 lb 3.7 oz)    SpO2: 94% 95% 91% 93%    Intake/Output Summary (Last 24 hours) at 02/01/14 1620 Last data filed at 02/01/14 1300  Gross per 24 hour  Intake    840 ml  Output   1000 ml  Net   -160 ml   Filed Weights   01/30/14 0418 01/31/14 0500 02/01/14 0446  Weight: 107.4 kg (236 lb 12.4 oz) 105.9 kg (233 lb 7.5 oz) 106.7 kg (235 lb 3.7 oz)     Exam:  General exam: No acute distress Respiratory system: Clear. No increased work of breathing. Cardiovascular system: S1 & S2 heard, irregularly irregular. No JVD, murmurs, gallops, clicks or pedal edema. Telemetry: Atrial fibrillation with controlled ventricular rate. Gastrointestinal system: Abdomen is nondistended, soft and nontender. Laparotomy site clean and dry. Normal bowel sounds heard. Central nervous system: Alert and oriented. No focal neurological deficits. Extremities: Symmetric 5 x 5 power.   Data Reviewed: Basic Metabolic Panel:  Recent Labs Lab 01/28/14 0535 01/29/14 0240 01/30/14 0500 01/31/14 0410 02/01/14 0432  NA 141 144 147 146 142  K 3.5* 3.8 3.8 4.4 3.9  CL 92* 96 99 98 96  CO2 33* 33* 32 32 34*  GLUCOSE 211* 161* 120* 100* 128*  BUN 90* 87* 96* 88* 63*  CREATININE 1.06 1.36* 1.92* 1.60* 1.14  CALCIUM 10.3 10.1 9.8 9.9 9.6  MG 1.7  --   --   --   --    Liver Function Tests:  Recent Labs Lab 01/28/14  0535  AST 17  ALT 10  ALKPHOS 81  BILITOT 0.5  PROT 8.0  ALBUMIN 3.4*   No results found for this basename: LIPASE, AMYLASE,  in the last 168 hours No results found for this basename: AMMONIA,  in the last 168 hours CBC:  Recent Labs Lab 01/28/14 0535 01/29/14 0240 01/30/14 0500 01/31/14 0410 02/01/14 0432  WBC 9.3 8.5 7.4 6.9 6.0  NEUTROABS 7.4  --   --   --   --   HGB 13.2 12.2* 11.1* 11.5* 11.4*  HCT 41.0 38.9* 36.4* 37.5* 36.2*  MCV 88.9 91.7 93.6 93.1 91.2    PLT 164 152 148* 152 143*   Cardiac Enzymes: No results found for this basename: CKTOTAL, CKMB, CKMBINDEX, TROPONINI,  in the last 168 hours BNP (last 3 results)  Recent Labs  04/14/13 1301 08/16/13 1351 01/17/14 1541  PROBNP 6847.0* 6560.0* 4873.0*   CBG:  Recent Labs Lab 01/31/14 1116 01/31/14 1617 01/31/14 2013 02/01/14 0531 02/01/14 1131  GLUCAP 149* 182* 164* 119* 165*    Recent Results (from the past 240 hour(s))  SURGICAL PCR SCREEN     Status: None   Collection Time    01/28/14  8:13 AM      Result Value Ref Range Status   MRSA, PCR NEGATIVE  NEGATIVE Final   Staphylococcus aureus NEGATIVE  NEGATIVE Final   Comment:            The Xpert SA Assay (FDA     approved for NASAL specimens     in patients over 65 years of age),     is one component of     a comprehensive surveillance     program.  Test performance has     been validated by [employer] for patients greater     than or equal to 72 year old.     It is not intended     to diagnose infection nor to     guide or monitor treatment.       Studies: No results found.      Scheduled Meds: . aspirin  81 mg Per J Tube Daily  . carvedilol  3.125 mg Per Tube BID  . feeding supplement (ENSURE COMPLETE)  237 mL Oral BID BM  . FLUoxetine  20 mg Per Tube Daily  . fluticasone  1 puff Inhalation BID  . gabapentin  300 mg Oral BID  . heparin  5,000 Units Subcutaneous 3 times per day  . insulin aspart  0-5 Units Subcutaneous QHS  . insulin aspart  0-9 Units Subcutaneous TID WC  . levothyroxine  150 mcg Per NG tube QAC breakfast  . sodium chloride  3 mL Intravenous Q12H   Continuous Infusions:    Principal Problem:   SBO (small bowel obstruction) Active Problems:   HYPERTENSION   DIASTOLIC HEART FAILURE, CHRONIC   CAROTID STENOSIS   CHRONIC OBSTRUCTIVE PULMONARY DISEASE   Atrial fibrillation   Hypothyroidism   Diabetes mellitus   Acute kidney injury    Time spent: 30  minutes.    Kelvin Cellar, MD, FACP, [facility]. [facility] Pager [phone]  If 7PM-7AM, please contact night-coverage [email or web address] Password [email or web address] 02/01/2014, 4:20 PM    LOS: 4 days

## 2014-02-01 NOTE — Progress Notes (Signed)
CSW spoke to patient about going to [facility] (patient's preference). Patient states he wants to go there for about a week to get stronger. MD made social worker aware of possible dc over the weekend. CSW notified Roderic Ovens at [facility] who states they can take patient over the weekend.  Jeanette Caprice, MSW, [place]

## 2014-02-02 DIAGNOSIS — R5381 Other malaise: Secondary | ICD-10-CM

## 2014-02-02 LAB — CBC
HCT: 36.8 % — ABNORMAL LOW (ref 39.0–52.0)
HEMOGLOBIN: 11.3 g/dL — AB (ref 13.0–17.0)
MCH: 28.3 pg (ref 26.0–34.0)
MCHC: 30.7 g/dL (ref 30.0–36.0)
MCV: 92.2 fL (ref 78.0–100.0)
Platelets: 148 10*3/uL — ABNORMAL LOW (ref 150–400)
RBC: 3.99 MIL/uL — ABNORMAL LOW (ref 4.22–5.81)
RDW: 16.3 % — AB (ref 11.5–15.5)
WBC: 6.8 10*3/uL (ref 4.0–10.5)

## 2014-02-02 LAB — BASIC METABOLIC PANEL
BUN: 47 mg/dL — AB (ref 6–23)
CHLORIDE: 99 meq/L (ref 96–112)
CO2: 28 mEq/L (ref 19–32)
CREATININE: 0.87 mg/dL (ref 0.50–1.35)
Calcium: 9.4 mg/dL (ref 8.4–10.5)
GFR calc Af Amer: >90 mL/min (ref >90)
GFR calc non Af Amer: 85 mL/min — ABNORMAL LOW (ref >90)
GLUCOSE: 118 mg/dL — AB (ref 70–99)
Potassium: 4.2 mEq/L (ref 3.7–5.3)
Sodium: 141 mEq/L (ref 137–147)

## 2014-02-02 LAB — GLUCOSE, CAPILLARY
Glucose-Capillary: 120 mg/dL — ABNORMAL HIGH (ref 70–99)
Glucose-Capillary: 182 mg/dL — ABNORMAL HIGH (ref 70–99)
Glucose-Capillary: 169 mg/dL — ABNORMAL HIGH (ref 70–99)
Glucose-Capillary: 133 mg/dL — ABNORMAL HIGH (ref 70–99)

## 2014-02-02 MED ORDER — LACTULOSE 10 GM/15ML PO SOLN
15.0000 g | Freq: Every day | ORAL | Status: DC | PRN
  Administered 2014-02-02: 15 g via ORAL
  Filled 2014-02-02: qty 30

## 2014-02-02 MED ORDER — BISACODYL 10 MG RE SUPP
10.0000 mg | Freq: Once | RECTAL | Status: DC
  Filled 2014-02-02: qty 1

## 2014-02-02 NOTE — Progress Notes (Signed)
PROGRESS NOTE    [name] HQI:[id number] DOB: [birth year] DOA: 01/28/2014 PCP: Jene Every, MD  HPI/Brief narrative 68 y.o. male with Past medical history of COPD, sleep apnea, peripheral vascular disease, coronary artery disease, diabetes mellitus, hypertension, hypothyroidism, atrial fibrillation, chronic diastolic heart failure presented with abdominal pain, nausea and vomiting. He was diagnosed with strangulated umbilical hernia and underwent surgery for same on 01/28/14.   Assessment/Plan:  1. Strangulated umbilical hernia:  -Status post hernia repair by surgery on 4/20. -NG tube discontinued on 01/30/14 -He reports passing flatus -Diet advanced to Full liquids -Still no bowel movement  2. Acute kidney injury -Likely secondary to prerenal etiology, as he has been n.p.o. for small bowel obstruction. -Creatinine increasing to 1.92 from 1.36 on 01/29/2014. -He was given a bolus of 1 L of normal saline, maintenance fluids restarted at 75 mL per hour -Repeat BMP on 02/01/14 showing improvement to kidney function, with Creatinine coming down to 1.14  3. Type II DM:  -Continue Accu-Cheks q. a.c. and each bedtime with sliding scale coverage -Blood sugars controlled  4. Atrial fibrillation:  -Remains rate controlled, heart rates in the 70s to 90s - Continue Coreg  5. Chronic diastolic CHF -Does not have clinical evidence of acute CHF  6. Hypothyroidism: Continue Synthroid   Code Status: Full Family Communication: Spoke with patient Disposition Plan: Transfer to telemetry   Consultants:  General surgery  Procedures:  Exploratory laparotomy, small bowel resection and umbilical hernia repair  NG tube  Foley catheter  Antibiotics:    Subjective: Patient states passing flatus, tolerating full liquids but still no bowel movements. Surgery following  Objective: Filed Vitals:   02/01/14 1402 02/01/14 1934 02/01/14 2005 02/02/14 0443  BP: 135/72 132/80   117/71  Pulse: 80 71  73  Temp: 97.3 F (36.3 C) 98.1 F (36.7 C)  98.1 F (36.7 C)  TempSrc: Oral Oral  Oral  Resp: 18 19  18   Height:      Weight:    108.2 kg (238 lb 8.6 oz)  SpO2: 93% 96% 95% 92%    Intake/Output Summary (Last 24 hours) at 02/02/14 1244 Last data filed at 02/01/14 2300  Gross per 24 hour  Intake    600 ml  Output    450 ml  Net    150 ml   Filed Weights   01/31/14 0500 02/01/14 0446 02/02/14 0443  Weight: 105.9 kg (233 lb 7.5 oz) 106.7 kg (235 lb 3.7 oz) 108.2 kg (238 lb 8.6 oz)     Exam:  General exam: No acute distress Respiratory system: Clear. No increased work of breathing. Cardiovascular system: S1 & S2 heard, irregularly irregular. No JVD, murmurs, gallops, clicks or pedal edema. Telemetry: Atrial fibrillation with controlled ventricular rate. Gastrointestinal system: Abdomen is nondistended, soft and nontender. Laparotomy site clean and dry. Normal bowel sounds heard. Central nervous system: Alert and oriented. No focal neurological deficits. Extremities: Symmetric 5 x 5 power.   Data Reviewed: Basic Metabolic Panel:  Recent Labs Lab 01/28/14 0535 01/29/14 0240 01/30/14 0500 01/31/14 0410 02/01/14 0432 02/02/14 0520  NA 141 144 147 146 142 141  K 3.5* 3.8 3.8 4.4 3.9 4.2  CL 92* 96 99 98 96 99  CO2 33* 33* 32 32 34* 28  GLUCOSE 211* 161* 120* 100* 128* 118*  BUN 90* 87* 96* 88* 63* 47*  CREATININE 1.06 1.36* 1.92* 1.60* 1.14 0.87  CALCIUM 10.3 10.1 9.8 9.9 9.6 9.4  MG 1.7  --   --   --   --   --  Liver Function Tests:  Recent Labs Lab 01/28/14 0535  AST 17  ALT 10  ALKPHOS 81  BILITOT 0.5  PROT 8.0  ALBUMIN 3.4*   No results found for this basename: LIPASE, AMYLASE,  in the last 168 hours No results found for this basename: AMMONIA,  in the last 168 hours CBC:  Recent Labs Lab 01/28/14 0535 01/29/14 0240 01/30/14 0500 01/31/14 0410 02/01/14 0432 02/02/14 0520  WBC 9.3 8.5 7.4 6.9 6.0 6.8  NEUTROABS 7.4   --   --   --   --   --   HGB 13.2 12.2* 11.1* 11.5* 11.4* 11.3*  HCT 41.0 38.9* 36.4* 37.5* 36.2* 36.8*  MCV 88.9 91.7 93.6 93.1 91.2 92.2  PLT 164 152 148* 152 143* 148*   Cardiac Enzymes: No results found for this basename: CKTOTAL, CKMB, CKMBINDEX, TROPONINI,  in the last 168 hours BNP (last 3 results)  Recent Labs  04/14/13 1301 08/16/13 1351 01/17/14 1541  PROBNP 6847.0* 6560.0* 4873.0*   CBG:  Recent Labs Lab 02/01/14 1131 02/01/14 1613 02/01/14 2140 02/02/14 0641 02/02/14 1118  GLUCAP 165* 159* 194* 120* 182*    Recent Results (from the past 240 hour(s))  SURGICAL PCR SCREEN     Status: None   Collection Time    01/28/14  8:13 AM      Result Value Ref Range Status   MRSA, PCR NEGATIVE  NEGATIVE Final   Staphylococcus aureus NEGATIVE  NEGATIVE Final   Comment:            The Xpert SA Assay (FDA     approved for NASAL specimens     in patients over 71 years of age),     is one component of     a comprehensive surveillance     program.  Test performance has     been validated by [employer] for patients greater     than or equal to 54 year old.     It is not intended     to diagnose infection nor to     guide or monitor treatment.       Studies: No results found.      Scheduled Meds: . aspirin  81 mg Per J Tube Daily  . bisacodyl  10 mg Rectal Once  . carvedilol  3.125 mg Per Tube BID  . feeding supplement (ENSURE COMPLETE)  237 mL Oral BID BM  . FLUoxetine  20 mg Per Tube Daily  . fluticasone  1 puff Inhalation BID  . gabapentin  300 mg Oral BID  . heparin  5,000 Units Subcutaneous 3 times per day  . insulin aspart  0-5 Units Subcutaneous QHS  . insulin aspart  0-9 Units Subcutaneous TID WC  . levothyroxine  150 mcg Per NG tube QAC breakfast  . sodium chloride  3 mL Intravenous Q12H   Continuous Infusions:    Principal Problem:   SBO (small bowel obstruction) Active Problems:   HYPERTENSION   DIASTOLIC HEART FAILURE, CHRONIC    CAROTID STENOSIS   CHRONIC OBSTRUCTIVE PULMONARY DISEASE   Atrial fibrillation   Hypothyroidism   Diabetes mellitus   Acute kidney injury    Time spent: 25 minutes.    Kelvin Cellar, MD, FACP, [facility]. [facility] Pager [phone]  If 7PM-7AM, please contact night-coverage [email or web address] Password [facility] 02/02/2014, 12:44 PM    LOS: 5 days

## 2014-02-02 NOTE — Progress Notes (Signed)
5 Days Post-Op  Subjective: On full liquids Passing flatus but no BM   Objective: Vital signs in last 24 hours: Temp:  [97.3 F (36.3 C)-98.1 F (36.7 C)] 98.1 F (36.7 C) (04/25 0443) Pulse Rate:  [71-80] 73 (04/25 0443) Resp:  [18-19] 18 (04/25 0443) BP: (117-135)/(71-80) 117/71 mmHg (04/25 0443) SpO2:  [91 %-96 %] 92 % (04/25 0443) Weight:  [238 lb 8.6 oz (108.2 kg)] 238 lb 8.6 oz (108.2 kg) (04/25 0443) Last BM Date: 01/26/14  Intake/Output from previous day: 04/24 0701 - 04/25 0700 In: 960 [P.O.:960] Out: 450 [Urine:450] Intake/Output this shift:    Abdomen soft with slight bloating Incision clean  Lab Results:   Recent Labs  02/01/14 0432 02/02/14 0520  WBC 6.0 6.8  HGB 11.4* 11.3*  HCT 36.2* 36.8*  PLT 143* 148*   BMET  Recent Labs  02/01/14 0432 02/02/14 0520  NA 142 141  K 3.9 4.2  CL 96 99  CO2 34* 28  GLUCOSE 128* 118*  BUN 63* 47*  CREATININE 1.14 0.87  CALCIUM 9.6 9.4   PT/INR No results found for this basename: LABPROT, INR,  in the last 72 hours ABG No results found for this basename: PHART, PCO2, PO2, HCO3,  in the last 72 hours  Studies/Results: No results found.  Anti-infectives: Anti-infectives   Start     Dose/Rate Route Frequency Ordered Stop   01/28/14 1700  ceFAZolin (ANCEF) IVPB 2 g/50 mL premix     2 g 100 mL/hr over 30 Minutes Intravenous 3 times per day 01/28/14 1455 01/29/14 0820      Assessment/Plan: s/p Procedure(s): HERNIA REPAIR UMBILICAL ADULT/INC (N/A) EXPLORATORY LAPAROTOMY (N/A) SMALL BOWEL RESECTION (N/A)  Keep on full liquids as he is still bloated and has not had a bm  LOS: 5 days    Harl Bowie 02/02/2014

## 2014-02-03 LAB — GLUCOSE, CAPILLARY
GLUCOSE-CAPILLARY: 181 mg/dL — AB (ref 70–99)
GLUCOSE-CAPILLARY: 152 mg/dL — AB (ref 70–99)
Glucose-Capillary: 118 mg/dL — ABNORMAL HIGH (ref 70–99)
Glucose-Capillary: 153 mg/dL — ABNORMAL HIGH (ref 70–99)

## 2014-02-03 MED ORDER — HYDROCORTISONE 2.5 % RE CREA
TOPICAL_CREAM | Freq: Three times a day (TID) | RECTAL | Status: DC | PRN
  Administered 2014-02-03: 1 via RECTAL
  Administered 2014-02-04: 12:00:00 via RECTAL
  Administered 2014-02-04: 1 via RECTAL
  Filled 2014-02-03: qty 28.35

## 2014-02-03 NOTE — Progress Notes (Signed)
PROGRESS NOTE    [name]:[id number] DOB: 14-Nov-1945 DOA: 01/28/2014 PCP: Jene Every, MD  HPI/Brief narrative 68 y.o. male with Past medical history of COPD, sleep apnea, peripheral vascular disease, coronary artery disease, diabetes mellitus, hypertension, hypothyroidism, atrial fibrillation, chronic diastolic heart failure presented with abdominal pain, nausea and vomiting. He was diagnosed with strangulated umbilical hernia and underwent surgery for same on 01/28/14.   Assessment/Plan:  1. Strangulated umbilical hernia:  -Status post hernia repair by surgery on 4/20. -NG tube discontinued on 01/30/14 -Patient having several small bowel movements -His diet advanced today which he seems to be tolerating  2. Acute kidney injury -Likely secondary to prerenal etiology, as he has been n.p.o. for small bowel obstruction. -Creatinine increasing to 1.92 from 1.36 on 01/29/2014. -He was given a bolus of 1 L of normal saline, maintenance fluids restarted at 75 mL per hour -Repeat BMP on 02/01/14 showing improvement to kidney function, with Creatinine coming down to 1.14  3. Type II DM:  -Continue Accu-Cheks q. a.c. and each bedtime with sliding scale coverage -Blood sugars controlled  4. Atrial fibrillation:  -Remains rate controlled, heart rates in the 70s to 90s - Continue Coreg  5. Chronic diastolic CHF -Does not have clinical evidence of acute CHF  6. Hypothyroidism: Continue Synthroid   Code Status: Full Family Communication: Spoke with patient Disposition Plan: Anticipate discharge to CIR on Monday if he continues to improve.    Consultants:  General surgery  Procedures:  Exploratory laparotomy, small bowel resection and umbilical hernia repair  NG tube  Foley catheter  Antibiotics:    Subjective: Patient having several small bowel movements, is tolerating PO intake. Remains afebrile  Objective: Filed Vitals:   02/03/14 0251 02/03/14 0300  02/03/14 0950 02/03/14 1000  BP: 121/56     Pulse: 73     Temp: 97.8 F (36.6 C)   97.7 F (36.5 C)  TempSrc: Oral   Axillary  Resp: 18     Height:      Weight:  109.3 kg (240 lb 15.4 oz)    SpO2: 94%  95%     Intake/Output Summary (Last 24 hours) at 02/03/14 1432 Last data filed at 02/03/14 0856  Gross per 24 hour  Intake    820 ml  Output    300 ml  Net    520 ml   Filed Weights   02/01/14 0446 02/02/14 0443 02/03/14 0300  Weight: 106.7 kg (235 lb 3.7 oz) 108.2 kg (238 lb 8.6 oz) 109.3 kg (240 lb 15.4 oz)     Exam:  General exam: No acute distress Respiratory system: Clear. No increased work of breathing. Cardiovascular system: S1 & S2 heard, irregularly irregular. No JVD, murmurs, gallops, clicks or pedal edema. Telemetry: Atrial fibrillation with controlled ventricular rate. Gastrointestinal system: Abdomen is nondistended, soft and nontender. Laparotomy site clean and dry. Normal bowel sounds heard. Central nervous system: Alert and oriented. No focal neurological deficits. Extremities: Symmetric 5 x 5 power.   Data Reviewed: Basic Metabolic Panel:  Recent Labs Lab 01/28/14 0535 01/29/14 0240 01/30/14 0500 01/31/14 0410 02/01/14 0432 02/02/14 0520  NA 141 144 147 146 142 141  K 3.5* 3.8 3.8 4.4 3.9 4.2  CL 92* 96 99 98 96 99  CO2 33* 33* 32 32 34* 28  GLUCOSE 211* 161* 120* 100* 128* 118*  BUN 90* 87* 96* 88* 63* 47*  CREATININE 1.06 1.36* 1.92* 1.60* 1.14 0.87  CALCIUM 10.3 10.1 9.8 9.9 9.6 9.4  MG 1.7  --   --   --   --   --    Liver Function Tests:  Recent Labs Lab 01/28/14 0535  AST 17  ALT 10  ALKPHOS 81  BILITOT 0.5  PROT 8.0  ALBUMIN 3.4*   No results found for this basename: LIPASE, AMYLASE,  in the last 168 hours No results found for this basename: AMMONIA,  in the last 168 hours CBC:  Recent Labs Lab 01/28/14 0535 01/29/14 0240 01/30/14 0500 01/31/14 0410 02/01/14 0432 02/02/14 0520  WBC 9.3 8.5 7.4 6.9 6.0 6.8    NEUTROABS 7.4  --   --   --   --   --   HGB 13.2 12.2* 11.1* 11.5* 11.4* 11.3*  HCT 41.0 38.9* 36.4* 37.5* 36.2* 36.8*  MCV 88.9 91.7 93.6 93.1 91.2 92.2  PLT 164 152 148* 152 143* 148*   Cardiac Enzymes: No results found for this basename: CKTOTAL, CKMB, CKMBINDEX, TROPONINI,  in the last 168 hours BNP (last 3 results)  Recent Labs  04/14/13 1301 08/16/13 1351 01/17/14 1541  PROBNP 6847.0* 6560.0* 4873.0*   CBG:  Recent Labs Lab 02/02/14 1118 02/02/14 1626 02/02/14 2144 02/03/14 0641 02/03/14 1128  GLUCAP 182* 169* 133* 118* 153*    Recent Results (from the past 240 hour(s))  SURGICAL PCR SCREEN     Status: None   Collection Time    01/28/14  8:13 AM      Result Value Ref Range Status   MRSA, PCR NEGATIVE  NEGATIVE Final   Staphylococcus aureus NEGATIVE  NEGATIVE Final   Comment:            The Xpert SA Assay (FDA     approved for NASAL specimens     in patients over 67 years of age),     is one component of     a comprehensive surveillance     program.  Test performance has     been validated by [employer] for patients greater     than or equal to 40 year old.     It is not intended     to diagnose infection nor to     guide or monitor treatment.       Studies: No results found.      Scheduled Meds: . aspirin  81 mg Per J Tube Daily  . bisacodyl  10 mg Rectal Once  . carvedilol  3.125 mg Per Tube BID  . feeding supplement (ENSURE COMPLETE)  237 mL Oral BID BM  . FLUoxetine  20 mg Per Tube Daily  . fluticasone  1 puff Inhalation BID  . gabapentin  300 mg Oral BID  . heparin  5,000 Units Subcutaneous 3 times per day  . insulin aspart  0-5 Units Subcutaneous QHS  . insulin aspart  0-9 Units Subcutaneous TID WC  . levothyroxine  150 mcg Per NG tube QAC breakfast  . sodium chloride  3 mL Intravenous Q12H   Continuous Infusions:    Principal Problem:   SBO (small bowel obstruction) Active Problems:   HYPERTENSION   DIASTOLIC HEART  FAILURE, CHRONIC   CAROTID STENOSIS   CHRONIC OBSTRUCTIVE PULMONARY DISEASE   Atrial fibrillation   Hypothyroidism   Diabetes mellitus   Acute kidney injury    Time spent: 25 minutes.    Kelvin Cellar, MD, FACP, [facility]. [facility] Pager [phone]  If 7PM-7AM, please contact night-coverage [email or web address] Password [facility] 02/03/2014, 2:32 PM  LOS: 6 days

## 2014-02-03 NOTE — Progress Notes (Addendum)
6 Days Post-Op  Subjective: Liquid BM this AM, has been walking  Objective: Vital signs in last 24 hours: Temp:  [97.8 F (36.6 C)-98.3 F (36.8 C)] 97.8 F (36.6 C) (04/26 0251) Pulse Rate:  [73-83] 73 (04/26 0251) Resp:  [18-20] 18 (04/26 0251) BP: (115-148)/(56-84) 121/56 mmHg (04/26 0251) SpO2:  [92 %-94 %] 94 % (04/26 0251) Weight:  [240 lb 15.4 oz (109.3 kg)] 240 lb 15.4 oz (109.3 kg) (04/26 0300) Last BM Date: 01/26/14 (given Lactulose per order see MAR)  Intake/Output from previous day: 04/25 0701 - 04/26 0700 In: 62 [P.O.:820] Out: 300 [Urine:300] Intake/Output this shift:    General appearance: alert and cooperative Resp: clear to auscultation bilaterally Cardio: regular rate and rhythm GI: soft, incision CDI, +BS, NT  Lab Results:   Recent Labs  02/01/14 0432 02/02/14 0520  WBC 6.0 6.8  HGB 11.4* 11.3*  HCT 36.2* 36.8*  PLT 143* 148*   BMET  Recent Labs  02/01/14 0432 02/02/14 0520  NA 142 141  K 3.9 4.2  CL 96 99  CO2 34* 28  GLUCOSE 128* 118*  BUN 63* 47*  CREATININE 1.14 0.87  CALCIUM 9.6 9.4   PT/INR No results found for this basename: LABPROT, INR,  in the last 72 hours ABG No results found for this basename: PHART, PCO2, PO2, HCO3,  in the last 72 hours  Studies/Results: No results found.  Anti-infectives: Anti-infectives   Start     Dose/Rate Route Frequency Ordered Stop   01/28/14 1700  ceFAZolin (ANCEF) IVPB 2 g/50 mL premix     2 g 100 mL/hr over 30 Minutes Intravenous 3 times per day 01/28/14 1455 01/29/14 0820      Assessment/Plan: s/p Procedure(s): HERNIA REPAIR UMBILICAL ADULT/INC (N/A) EXPLORATORY LAPAROTOMY (N/A) SMALL BOWEL RESECTION (N/A) Advance diet Likely ready for CIR tomorrow I spoke with Dr. Coralyn Pear  LOS: 6 days    Zenovia Jarred 02/03/2014

## 2014-02-04 ENCOUNTER — Inpatient Hospital Stay (HOSPITAL_COMMUNITY): Payer: Medicare Other

## 2014-02-04 DIAGNOSIS — I10 Essential (primary) hypertension: Secondary | ICD-10-CM

## 2014-02-04 LAB — GLUCOSE, CAPILLARY
GLUCOSE-CAPILLARY: 175 mg/dL — AB (ref 70–99)
Glucose-Capillary: 121 mg/dL — ABNORMAL HIGH (ref 70–99)
Glucose-Capillary: 186 mg/dL — ABNORMAL HIGH (ref 70–99)
Glucose-Capillary: 178 mg/dL — ABNORMAL HIGH (ref 70–99)

## 2014-02-04 MED ORDER — OXYCODONE HCL 5 MG PO TABS
10.0000 mg | ORAL_TABLET | ORAL | Status: DC | PRN
Start: 2014-02-04 — End: 2014-02-05
  Administered 2014-02-04: 5 mg via ORAL
  Administered 2014-02-05: 10 mg via ORAL
  Filled 2014-02-04 (×3): qty 2

## 2014-02-04 MED ORDER — MORPHINE SULFATE 2 MG/ML IJ SOLN
2.0000 mg | INTRAMUSCULAR | Status: DC | PRN
  Filled 2014-02-04: qty 1

## 2014-02-04 NOTE — Progress Notes (Signed)
02/04/2014 1330 Nursing note After administration of PRN pain and nausea medication, pt. Noted to be significantly confused. Pt. Alert to self but disoriented to location and time. Pt. Stated he "didn't feel right" and "swimmy headed". Vital signs collected BP 132/64, HR 88  Oxygen saturation 92% on 2L o2 [place]. CBG 186. Bladder scan performed showing 23 cc of residual urine. Neruo check performed with no acute change from am assessment noted at this time.  Pt. Assisted back to bed and bed alarm utilized. Pt. Instructed not to ambulated to bathroom while feeling dizzy. Dr. Coralyn Pear on floor and made aware of patient confusion. No new orders at this time. Will continue to monitor patient.  [place]

## 2014-02-04 NOTE — Progress Notes (Signed)
02/04/2014 8:55 AM Nursing note Pt. Ambulated 80 ft in hallway with rolling walker and with RN. Pt. C/o weakness during ambulation and assisted back to room. Pt. Oxygen saturations checked on room air upon return to room noted at 86%. 2L o2 applied and oxygen saturations raised to 97%. Encouraged further ambulation today and incentive spirometry use. Will continue to monitor patient.  [place]

## 2014-02-04 NOTE — Progress Notes (Signed)
PROGRESS NOTE    [name] XHB:[id number] DOB: 01-19-1946 DOA: 01/28/2014 PCP: Jene Every, MD  HPI/Brief narrative 68 y.o. male with Past medical history of COPD, sleep apnea, peripheral vascular disease, coronary artery disease, diabetes mellitus, hypertension, hypothyroidism, atrial fibrillation, chronic diastolic heart failure presented with abdominal pain, nausea and vomiting. He was diagnosed with strangulated umbilical hernia and underwent surgery for same on 01/28/14.   Assessment/Plan:  1. Strangulated umbilical hernia:  -Status post hernia repair by surgery on 4/20. -NG tube discontinued on 01/30/14 -Patient having several small bowel movements -He complains of worsening abd pain, had several episodes of dry heaves. Will check a KUB, provide antiemetic therapy.   2. Acute kidney injury -Likely secondary to prerenal etiology, as he has been n.p.o. for small bowel obstruction. -Creatinine increasing to 1.92 from 1.36 on 01/29/2014. -He was given a bolus of 1 L of normal saline, maintenance fluids restarted at 75 mL per hour -Repeat BMP on 02/01/14 showing improvement to kidney function, with Creatinine coming down to 1.14  3. Type II DM:  -Continue Accu-Cheks q. a.c. and each bedtime with sliding scale coverage -Blood sugars controlled  4. Atrial fibrillation:  -Remains rate controlled, heart rates in the 70s to 90s - Continue Coreg  5. Chronic diastolic CHF -Does not have clinical evidence of acute CHF  6. Hypothyroidism: Continue Synthroid   Code Status: Full Family Communication: Spoke with patient Disposition Plan: Patient feeling well today, reporting nausea and worsening abd pain. Checking a KUB, hold d/c to SNF for now.    Consultants:  General surgery  Procedures:  Exploratory laparotomy, small bowel resection and umbilical hernia repair  NG tube  Foley catheter  Antibiotics:    Subjective: Patient states not feeling well. He reports  severe lower abdominal pain associated with nausea no emesis. Had small bowel movement this am.   Objective: Filed Vitals:   02/03/14 1510 02/03/14 2016 02/04/14 0400 02/04/14 0857  BP: 139/64 144/69 125/64 151/74  Pulse: 74 63 71 88  Temp: 98.3 F (36.8 C) 98 F (36.7 C) 98.2 F (36.8 C)   TempSrc: Oral Oral Oral   Resp: 17 18 18    Height:      Weight:   109.3 kg (240 lb 15.4 oz)   SpO2: 96% 95% 96%     Intake/Output Summary (Last 24 hours) at 02/04/14 1331 Last data filed at 02/04/14 1140  Gross per 24 hour  Intake    680 ml  Output    951 ml  Net   -271 ml   Filed Weights   02/02/14 0443 02/03/14 0300 02/04/14 0400  Weight: 108.2 kg (238 lb 8.6 oz) 109.3 kg (240 lb 15.4 oz) 109.3 kg (240 lb 15.4 oz)     Exam:  General exam: Appears uncomfortable, no acute distress Respiratory system: Clear. No increased work of breathing. Cardiovascular system: S1 & S2 heard, irregularly irregular. No JVD, murmurs, gallops, clicks or pedal edema. Telemetry: Atrial fibrillation with controlled ventricular rate. Gastrointestinal system: Abdomen is nondistended, soft and nontender. Laparotomy site clean and dry. Normal bowel sounds heard. Central nervous system: Alert and oriented. No focal neurological deficits. Extremities: Symmetric 5 x 5 power.   Data Reviewed: Basic Metabolic Panel:  Recent Labs Lab 01/29/14 0240 01/30/14 0500 01/31/14 0410 02/01/14 0432 02/02/14 0520  NA 144 147 146 142 141  K 3.8 3.8 4.4 3.9 4.2  CL 96 99 98 96 99  CO2 33* 32 32 34* 28  GLUCOSE 161* 120* 100*  128* 118*  BUN 87* 96* 88* 63* 47*  CREATININE 1.36* 1.92* 1.60* 1.14 0.87  CALCIUM 10.1 9.8 9.9 9.6 9.4   Liver Function Tests: No results found for this basename: AST, ALT, ALKPHOS, BILITOT, PROT, ALBUMIN,  in the last 168 hours No results found for this basename: LIPASE, AMYLASE,  in the last 168 hours No results found for this basename: AMMONIA,  in the last 168 hours CBC:  Recent  Labs Lab 01/29/14 0240 01/30/14 0500 01/31/14 0410 02/01/14 0432 02/02/14 0520  WBC 8.5 7.4 6.9 6.0 6.8  HGB 12.2* 11.1* 11.5* 11.4* 11.3*  HCT 38.9* 36.4* 37.5* 36.2* 36.8*  MCV 91.7 93.6 93.1 91.2 92.2  PLT 152 148* 152 143* 148*   Cardiac Enzymes: No results found for this basename: CKTOTAL, CKMB, CKMBINDEX, TROPONINI,  in the last 168 hours BNP (last 3 results)  Recent Labs  04/14/13 1301 08/16/13 1351 01/17/14 1541  PROBNP 6847.0* 6560.0* 4873.0*   CBG:  Recent Labs Lab 02/03/14 1128 02/03/14 1634 02/03/14 2145 02/04/14 0603 02/04/14 1122  GLUCAP 153* 181* 152* 121* 175*    Recent Results (from the past 240 hour(s))  SURGICAL PCR SCREEN     Status: None   Collection Time    01/28/14  8:13 AM      Result Value Ref Range Status   MRSA, PCR NEGATIVE  NEGATIVE Final   Staphylococcus aureus NEGATIVE  NEGATIVE Final   Comment:            The Xpert SA Assay (FDA     approved for NASAL specimens     in patients over 30 years of age),     is one component of     a comprehensive surveillance     program.  Test performance has     been validated by [employer] for patients greater     than or equal to 51 year old.     It is not intended     to diagnose infection nor to     guide or monitor treatment.       Studies: No results found.      Scheduled Meds: . aspirin  81 mg Per J Tube Daily  . bisacodyl  10 mg Rectal Once  . carvedilol  3.125 mg Per Tube BID  . feeding supplement (ENSURE COMPLETE)  237 mL Oral BID BM  . FLUoxetine  20 mg Per Tube Daily  . fluticasone  1 puff Inhalation BID  . gabapentin  300 mg Oral BID  . heparin  5,000 Units Subcutaneous 3 times per day  . insulin aspart  0-5 Units Subcutaneous QHS  . insulin aspart  0-9 Units Subcutaneous TID WC  . levothyroxine  150 mcg Per NG tube QAC breakfast  . sodium chloride  3 mL Intravenous Q12H   Continuous Infusions:    Principal Problem:   SBO (small bowel  obstruction) Active Problems:   HYPERTENSION   DIASTOLIC HEART FAILURE, CHRONIC   CAROTID STENOSIS   CHRONIC OBSTRUCTIVE PULMONARY DISEASE   Atrial fibrillation   Hypothyroidism   Diabetes mellitus   Acute kidney injury    Time spent: 25 minutes.    Kelvin Cellar, MD, FACP, [facility]. [facility] Pager [phone]  If 7PM-7AM, please contact night-coverage [email or web address] Password [email or web address] 02/04/2014, 1:31 PM    LOS: 7 days

## 2014-02-04 NOTE — Progress Notes (Signed)
7 Days Post-Op  Subjective: PT tol PO well +BM  Objective: Vital signs in last 24 hours: Temp:  [97.7 F (36.5 C)-98.3 F (36.8 C)] 98.2 F (36.8 C) (04/27 0400) Pulse Rate:  [63-74] 71 (04/27 0400) Resp:  [17-18] 18 (04/27 0400) BP: (125-144)/(64-69) 125/64 mmHg (04/27 0400) SpO2:  [95 %-96 %] 96 % (04/27 0400) Weight:  [240 lb 15.4 oz (109.3 kg)] 240 lb 15.4 oz (109.3 kg) (04/27 0400) Last BM Date: 02/03/14  Intake/Output from previous day: 04/26 0701 - 04/27 0700 In: 920 [P.O.:920] Out: 350 [Urine:350] Intake/Output this shift:    General appearance: alert and cooperative GI: soft, approp ttp, ND, active BS, wound c/d/i  Lab Results:   Recent Labs  02/02/14 0520  WBC 6.8  HGB 11.3*  HCT 36.8*  PLT 148*   BMET  Recent Labs  02/02/14 0520  NA 141  K 4.2  CL 99  CO2 28  GLUCOSE 118*  BUN 47*  CREATININE 0.87  CALCIUM 9.4   PT/INR No results found for this basename: LABPROT, INR,  in the last 72 hours ABG No results found for this basename: PHART, PCO2, PO2, HCO3,  in the last 72 hours  Studies/Results: No results found.  Anti-infectives: Anti-infectives   Start     Dose/Rate Route Frequency Ordered Stop   01/28/14 1700  ceFAZolin (ANCEF) IVPB 2 g/50 mL premix     2 g 100 mL/hr over 30 Minutes Intravenous 3 times per day 01/28/14 1455 01/29/14 0820      Assessment/Plan: s/p Procedure(s): HERNIA REPAIR UMBILICAL ADULT/INC (N/A) EXPLORATORY LAPAROTOMY (N/A) SMALL BOWEL RESECTION (N/A) Cont' Po Encourage amb OK for DC from surgical standpoint F/u x 2 weeks   LOS: 7 days    Ralene Ok 02/04/2014

## 2014-02-04 NOTE — Progress Notes (Signed)
CSW updated [facility] and continues to follow patient for dc to SNF when ready.  Jeanette Caprice, MSW, [place]

## 2014-02-04 NOTE — Discharge Instructions (Signed)
[facility]      [facility] [phone]  OPEN ABDOMINAL SURGERY: POST OP INSTRUCTIONS  Always review your discharge instruction sheet given to you by the facility where your surgery was performed.  IF YOU HAVE DISABILITY OR FAMILY LEAVE FORMS, YOU MUST BRING THEM TO THE OFFICE FOR PROCESSING.  PLEASE DO NOT GIVE THEM TO YOUR DOCTOR.  1. A prescription for pain medication may be given to you upon discharge.  Take your pain medication as prescribed, if needed.  If narcotic pain medicine is not needed, then you may take acetaminophen (Tylenol) or ibuprofen (Advil) as needed. 2. Take your usually prescribed medications unless otherwise directed. 3. If you need a refill on your pain medication, please contact your pharmacy. They will contact our office to request authorization.  Prescriptions will not be filled after 5pm or on week-ends. 4. You should follow a light diet the first few days after arrival home, such as soup and crackers, pudding, etc.unless your doctor has advised otherwise. A high-fiber, low fat diet can be resumed as tolerated.   Be sure to include lots of fluids daily. Most patients will experience some swelling and bruising on the chest and neck area.  Ice packs will help.  Swelling and bruising can take several days to resolve 5. Most patients will experience some swelling and bruising in the area of the incision. Ice pack will help. Swelling and bruising can take several days to resolve..  6. It is common to experience some constipation if taking pain medication after surgery.  Increasing fluid intake and taking a stool softener will usually help or prevent this problem from occurring.  A mild laxative (Milk of Magnesia or Miralax) should be taken according to package directions if there are no bowel movements after 48 hours. 7.  You may have steri-strips (small skin tapes) in place directly over the incision.  These strips should be left on the skin for 7-10 days.  If your  surgeon used skin glue on the incision, you may shower in 24 hours.  The glue will flake off over the next 2-3 weeks.  Any sutures or staples will be removed at the office during your follow-up visit. You may find that a light gauze bandage over your incision may keep your staples from being rubbed or pulled. You may shower and replace the bandage daily. 8. ACTIVITIES:  You may resume regular (light) daily activities beginning the next day--such as daily self-care, walking, climbing stairs--gradually increasing activities as tolerated.  You may have sexual intercourse when it is comfortable.  Refrain from any heavy lifting or straining until approved by your doctor. a. You may drive when you no longer are taking prescription pain medication, you can comfortably wear a seatbelt, and you can safely maneuver your car and apply brakes b. Return to Work: ___________________________________ 9. You should see your doctor in the office for a follow-up appointment approximately two weeks after your surgery.  Make sure that you call for this appointment within a day or two after you arrive home to insure a convenient appointment time. OTHER INSTRUCTIONS:  _____________________________________________________________ _____________________________________________________________  WHEN TO CALL YOUR DOCTOR: 1. Fever over 101.0 2. Inability to urinate 3. Nausea and/or vomiting 4. Extreme swelling or bruising 5. Continued bleeding from incision. 6. Increased pain, redness, or drainage from the incision. 7. Difficulty swallowing or breathing 8. Muscle cramping or spasms. 9. Numbness or tingling in hands or feet or around lips.  The clinic staff is available to   answer your questions during regular business hours.  Please don't hesitate to call and ask to speak to one of the nurses if you have concerns.  For further questions, please visit [email or web address]   

## 2014-02-05 LAB — GLUCOSE, CAPILLARY
GLUCOSE-CAPILLARY: 125 mg/dL — AB (ref 70–99)
Glucose-Capillary: 215 mg/dL — ABNORMAL HIGH (ref 70–99)
Glucose-Capillary: 137 mg/dL — ABNORMAL HIGH (ref 70–99)

## 2014-02-05 MED ORDER — CARVEDILOL 3.125 MG PO TABS
3.1250 mg | ORAL_TABLET | Freq: Two times a day (BID) | ORAL | Status: DC
  Filled 2014-02-05 (×2): qty 1

## 2014-02-05 MED ORDER — LACTULOSE 10 GM/15ML PO SOLN: 15.0000 g | Freq: Every day | ORAL | Status: DC | PRN

## 2014-02-05 MED ORDER — OXYCODONE HCL 5 MG PO TABS: 5.0000 mg | ORAL_TABLET | ORAL | Status: DC | PRN

## 2014-02-05 MED ORDER — BISACODYL 5 MG PO TBEC: 5.0000 mg | DELAYED_RELEASE_TABLET | Freq: Every day | ORAL | Status: DC | PRN

## 2014-02-05 MED ORDER — HYDROCORTISONE 2.5 % RE CREA: TOPICAL_CREAM | Freq: Three times a day (TID) | RECTAL | Status: DC | PRN

## 2014-02-05 MED ORDER — ASPIRIN 81 MG PO CHEW: 81.0000 mg | CHEWABLE_TABLET | Freq: Every day | ORAL | Status: DC

## 2014-02-05 MED ORDER — LEVOTHYROXINE SODIUM 150 MCG PO TABS
150.0000 ug | ORAL_TABLET | Freq: Every day | ORAL | Status: DC
  Filled 2014-02-05: qty 1

## 2014-02-05 MED ORDER — ASPIRIN 81 MG PO CHEW
81.0000 mg | CHEWABLE_TABLET | Freq: Every day | ORAL | Status: DC
  Administered 2014-02-05: 81 mg via ORAL
  Filled 2014-02-05: qty 1

## 2014-02-05 MED ORDER — SIMETHICONE 80 MG PO CHEW
80.0000 mg | CHEWABLE_TABLET | Freq: Four times a day (QID) | ORAL | Status: DC | PRN
  Administered 2014-02-05: 80 mg via ORAL
  Filled 2014-02-05: qty 1

## 2014-02-05 MED ORDER — OXYCODONE HCL 5 MG PO TABS
5.0000 mg | ORAL_TABLET | ORAL | Status: DC | PRN
  Administered 2014-02-05: 5 mg via ORAL
  Filled 2014-02-05: qty 1

## 2014-02-05 MED ORDER — ENSURE COMPLETE PO LIQD: 237.0000 mL | Freq: Two times a day (BID) | ORAL | Status: DC

## 2014-02-05 MED ORDER — FLUOXETINE HCL 20 MG PO CAPS
20.0000 mg | ORAL_CAPSULE | Freq: Every day | ORAL | Status: DC
  Administered 2014-02-05: 20 mg via ORAL
  Filled 2014-02-05: qty 1

## 2014-02-05 NOTE — Discharge Summary (Signed)
Physician Discharge Summary  [name] [id number] DOB: Jun 04, 1946 DOA: 01/28/2014  PCP: Jene Every, MD  Admit date: 01/28/2014 Discharge date: 02/05/2014  Time spent: 35 minutes  Recommendations for Outpatient Follow-up:  1. Please follow up on BMP and CBC in 1 week 2. Follow up on volume status, has h/o CHF, diuretics held during this hospitalization due to dehydration and renal failure. Was restarted on Spironolactone on discharge. Also takes Demadex which may need to be restarted in the outpatient setting.  3. Will need to follow up with surgery in 2 weeks 4. Patient to be discharged to [facility]  Discharge Diagnoses:  Principal Problem:   SBO (small bowel obstruction) Active Problems:   HYPERTENSION   DIASTOLIC HEART FAILURE, CHRONIC   CAROTID STENOSIS   CHRONIC OBSTRUCTIVE PULMONARY DISEASE   Atrial fibrillation   Hypothyroidism   Diabetes mellitus   Acute kidney injury   Discharge Condition: Stable  Diet recommendation: Heart Healthy/Carb Consistent  Filed Weights   02/03/14 0300 02/04/14 0400 02/05/14 0438  Weight: 109.3 kg (240 lb 15.4 oz) 109.3 kg (240 lb 15.4 oz) 109.1 kg (240 lb 8.4 oz)    History of present illness:  [name] is a 68 y.o. male with Past medical history of COPD, sleep apnea, peripheral vascular disease, coronary artery disease, diabetes mellitus, hypertension, hypothyroidism, atrial fibrillation, chronic diastolic heart failure.  The patient presented with complaints of abdominal pain that has been ongoing since last Friday. This is associated with persistent nausea and multiple episodes of vomiting without any blood. She also complains of persistent heartburn and acid reflux. The patient mentions that today his pain was significantly worse and this was located in the lower abdomen area. He described his pain as burning. Patient denies any fever, chills, nausea at present, diarrhea, burning urination, CVA pain.  He denies any  recent change in his medication. He has a history of SBO in the past but at that time he did not have any pain.  Patient has not been on Xarelto yet.  The patient is coming from home. And at her baseline Independent for most of his ADL.   Hospital Course:  Patient is a pleasant 68 year old gentleman with a past medical history of chronic obstructive pulmonary disease, type 2 diabetes mellitus, hypertension, chronic diastolic congestive heart failure who was admitted to the medicine service on 01/28/2014, presenting with abdominal pain and vomiting. CT scan of abdomen and pelvis performed at outside hospital revealed small bowel obstruction likely secondary to umbilical hernia. Patient was made n.p.o., NG tube placed, admitted to telemetry, with surgery consultation. He was taken to the OR on 01/28/2014 undergoing exploratory laparotomy with small bowel resection and repair of hernia. He was found to have approximately 2 inches of infarcted bowel. Hospitalization was complicated by the development of acute renal failure likely secondary to prerenal azotemia. On 01/30/2014 his creatinine trended up to 1.92 with BUN of 96 from creatinine 1.36 and BUN of 87 on 01/29/2014. That morning he was bolused with normal saline as maintenance fluids were continued overnight. His kidney function improved over the following days and by 02/02/2014 had a creatinine of 0.87 with BUN of 47. His diuretic therapy have been held during this time. Patient complaining of abdominal pain for which a KUB was done on 02/04/2014. Study revealed nonobstructive gas bowel pattern, no pneumoperitoneum. He was seen and evaluated by general surgery on 01/28/2014, felt stable for discharge. Patient was discharged to skilled nursing facility on this date. Patient had  been having small bowel movements over the past several days.   Procedures: 1. Exploratory laparotomy.Small bowel resection. Primary repair of strangulated umbilical  hernia.   Consultations:  General surgery  Discharge Exam: Filed Vitals:   02/05/14 1052  BP: 112/56  Pulse:   Temp:   Resp:     General: On day of discharge he appears to be more comfortable, in no acute distress, awake and alert Cardiovascular: Regular rate rhythm normal S1-S2 Respiratory: Clear to auscultation bilaterally Abdomen: Patient having mild generalized pain to palpation Extremities: Patient has 1+ bilateral extremity pitting edema  Discharge Instructions You were cared for by a hospitalist during your hospital stay. If you have any questions about your discharge medications or the care you received while you were in the hospital after you are discharged, you can call the unit and asked to speak with the hospitalist on call if the hospitalist that took care of you is not available. Once you are discharged, your primary care physician will handle any further medical issues. Please note that NO REFILLS for any discharge medications will be authorized once you are discharged, as it is imperative that you return to your primary care physician (or establish a relationship with a primary care physician if you do not have one) for your aftercare needs so that they can reassess your need for medications and monitor your lab values.  Discharge Orders   Future Appointments Provider Department Dept Phone   02/18/2014 9:40 AM Harl Bowie, [facility] [place]   02/19/2014 2:30 PM [facility] [address] [phone]   02/19/2014 3:30 PM Burnell Blanks, MD [place] Office [phone]   Future Orders Complete By Expires   (HEART FAILURE PATIENTS) Call MD:  Anytime you have any of the following symptoms: 1) 3 pound weight gain in 24 hours or 5 pounds in 1 week 2) shortness of breath, with or without a dry hacking cough 3) swelling in the hands, feet or stomach 4) if you have to sleep on extra pillows  at night in order to breathe.  As directed    Call MD for:  difficulty breathing, headache or visual disturbances  As directed    Call MD for:  extreme fatigue  As directed    Call MD for:  persistant dizziness or light-headedness  As directed    Call MD for:  persistant nausea and vomiting  As directed    Call MD for:  severe uncontrolled pain  As directed    Call MD for:  temperature >100.4  As directed    Diet - low sodium heart healthy  As directed    Increase activity slowly  As directed        Medication List    STOP taking these medications       aspirin EC 81 MG tablet  Replaced by:  aspirin 81 MG chewable tablet     colchicine 0.6 MG tablet     folic acid 1 MG tablet  Commonly known as:  FOLVITE     hydrALAZINE 25 MG tablet  Commonly known as:  APRESOLINE     metolazone 2.5 MG tablet  Commonly known as:  ZAROXOLYN     multivitamin with minerals Tabs tablet     senna 8.6 MG tablet  Commonly known as:  SENOKOT     torsemide 20 MG tablet  Commonly known as:  DEMADEX  TAKE these medications       aspirin 81 MG chewable tablet  Chew 1 tablet (81 mg total) by mouth daily.     bisacodyl 5 MG EC tablet  Commonly known as:  DULCOLAX  Take 1 tablet (5 mg total) by mouth daily as needed for moderate constipation.     carvedilol 3.125 MG tablet  Commonly known as:  COREG  Take 3.125 mg by mouth 2 (two) times daily with a meal. Take with the carvedilol 6.25mg  twice daily     feeding supplement (ENSURE COMPLETE) Liqd  Take 237 mLs by mouth 2 (two) times daily between meals.     FLUoxetine 20 MG capsule  Commonly known as:  PROZAC  Take 20 mg by mouth daily.     fluticasone 110 MCG/ACT inhaler  Commonly known as:  FLOVENT HFA  Inhale 1 puff into the lungs 2 (two) times daily.     gabapentin 300 MG capsule  Commonly known as:  NEURONTIN  Take 1 capsule (300 mg total) by mouth daily.     hydrocortisone 2.5 % rectal cream  Commonly known as:  ANUSOL-HC   Place rectally 3 (three) times daily as needed for hemorrhoids or itching.     lactulose 10 GM/15ML solution  Commonly known as:  CHRONULAC  Take 22.5 mLs (15 g total) by mouth daily as needed for mild constipation.     LANTUS SOLOSTAR 100 UNIT/ML Solostar Pen  Generic drug:  Insulin Glargine  Inject 14 Units into the skin at bedtime.     levothyroxine 150 MCG tablet  Commonly known as:  SYNTHROID, LEVOTHROID  Take 150 mcg by mouth at bedtime.     omeprazole 20 MG capsule  Commonly known as:  PRILOSEC  Take 20 mg by mouth daily.     oxyCODONE 5 MG immediate release tablet  Commonly known as:  Oxy IR/ROXICODONE  Take 1 tablet (5 mg total) by mouth every 4 (four) hours as needed for severe pain.     simvastatin 40 MG tablet  Commonly known as:  ZOCOR  Take 40 mg by mouth at bedtime.     spironolactone 25 MG tablet  Commonly known as:  ALDACTONE  Take 1 tablet (25 mg total) by mouth daily.       Allergies  Allergen Reactions  . Erythromycin Hives       Follow-up Information   Follow up with Harl Bowie, MD On 02/18/2014. (9:30am, arrive at 9:15am for paperwork)    Specialty:  General Surgery   Contact information:   [place] [address] [phone]        The results of significant diagnostics from this hospitalization (including imaging, microbiology, ancillary and laboratory) are listed below for reference.    Significant Diagnostic Studies: Dg Abd 1 View  02/04/2014   CLINICAL DATA:  Abdominal pain.  EXAM: ABDOMEN - 1 VIEW  COMPARISON:  Abdominal radiograph 01/17/2014.  FINDINGS: Gas and stool are noted throughout the colon. No pathologic distention of small bowel. There are a few nondilated loops of gas-filled small bowel noted throughout the abdomen. No definite pneumoperitoneum on this single supine view of the abdomen. Several surgical clips project over the right hip.  IMPRESSION: 1. Nonspecific, nonobstructive bowel gas  pattern, as above. 2. No pneumoperitoneum.   Electronically Signed   By: Vinnie Langton M.D.   On: 02/04/2014 16:18    Microbiology: Recent Results (from the past 240 hour(s))  SURGICAL PCR SCREEN  Status: None   Collection Time    01/28/14  8:13 AM      Result Value Ref Range Status   MRSA, PCR NEGATIVE  NEGATIVE Final   Staphylococcus aureus NEGATIVE  NEGATIVE Final   Comment:            The Xpert SA Assay (FDA     approved for NASAL specimens     in patients over 37 years of age),     is one component of     a comprehensive surveillance     program.  Test performance has     been validated by [employer] for patients greater     than or equal to 30 year old.     It is not intended     to diagnose infection nor to     guide or monitor treatment.     Labs: Basic Metabolic Panel:  Recent Labs Lab 01/30/14 0500 01/31/14 0410 02/01/14 0432 02/02/14 0520  NA 147 146 142 141  K 3.8 4.4 3.9 4.2  CL 99 98 96 99  CO2 32 32 34* 28  GLUCOSE 120* 100* 128* 118*  BUN 96* 88* 63* 47*  CREATININE 1.92* 1.60* 1.14 0.87  CALCIUM 9.8 9.9 9.6 9.4   Liver Function Tests: No results found for this basename: AST, ALT, ALKPHOS, BILITOT, PROT, ALBUMIN,  in the last 168 hours No results found for this basename: LIPASE, AMYLASE,  in the last 168 hours No results found for this basename: AMMONIA,  in the last 168 hours CBC:  Recent Labs Lab 01/30/14 0500 01/31/14 0410 02/01/14 0432 02/02/14 0520  WBC 7.4 6.9 6.0 6.8  HGB 11.1* 11.5* 11.4* 11.3*  HCT 36.4* 37.5* 36.2* 36.8*  MCV 93.6 93.1 91.2 92.2  PLT 148* 152 143* 148*   Cardiac Enzymes: No results found for this basename: CKTOTAL, CKMB, CKMBINDEX, TROPONINI,  in the last 168 hours BNP: BNP (last 3 results)  Recent Labs  04/14/13 1301 08/16/13 1351 01/17/14 1541  PROBNP 6847.0* 6560.0* 4873.0*   CBG:  Recent Labs Lab 02/04/14 1122 02/04/14 1344 02/04/14 1636 02/04/14 2202 02/05/14 0636  GLUCAP 175*  186* 178* 215* 125*       Signed:  [facility] 02/05/2014, 11:24 AM

## 2014-02-05 NOTE — Progress Notes (Signed)
Clinical Social Work Department CLINICAL SOCIAL WORK PLACEMENT NOTE 02/05/2014  Patient:  [name], [name]  Account Number:  [id number] Admit date:  01/28/2014  Clinical Social Worker:  Megan Salon  Date/time:  01/31/2014 10:56 AM  Clinical Social Work is seeking post-discharge placement for this patient at the following level of care:   [place]   (*CSW will update this form in Epic as items are completed)   01/31/2014  Patient/family provided with [place] Department of Clinical Social Work's list of facilities offering this level of care within the geographic area requested by the patient (or if unable, by the patient's family).  01/31/2014  Patient/family informed of their freedom to choose among providers that offer the needed level of care, that participate in Medicare, Medicaid or managed care program needed by the patient, have an available bed and are willing to accept the patient.  01/31/2014  Patient/family informed of [facility]' ownership interest in [facility], as well as of the fact that they are under no obligation to receive care at this facility.  PASARR submitted to EDS on  PASARR number received from [place] on   FL2 transmitted to all facilities in geographic area requested by pt/family on  01/31/2014 FL2 transmitted to all facilities within larger geographic area on   Patient informed that his/her managed care company has contracts with or will negotiate with  certain facilities, including the following:     Patient/family informed of bed offers received:  02/05/2014 Patient chooses bed at [facility] [place] Physician recommends and patient chooses bed at    Patient to be transferred to [place] on  02/05/2014 Patient to be transferred to facility by EMS  The following physician request were entered in Epic:   Additional Comments: Patient has previous [place], MSW,  [place]

## 2014-02-05 NOTE — Progress Notes (Signed)
Physical Therapy Treatment Patient Details Name: [name] MRN: [id number] DOB: 1945/11/27 Today's Date: 23-Feb-2014    History of Present Illness pt s/p  Exploratory laparotomy, repair of umbilical hernia    PT Comments    Pt making steady progress.  Follow Up Recommendations  SNF     Equipment Recommendations  None recommended by PT    Recommendations for Other Services       Precautions / Restrictions Precautions Precautions: Fall    Mobility  Bed Mobility                  Transfers Overall transfer level: Needs assistance Equipment used: Rolling walker (2 wheeled) Transfers: Sit to/from Stand Sit to Stand: Min assist         General transfer comment: Assist to bring hips up from low surface.  Ambulation/Gait Ambulation/Gait assistance: Min guard Ambulation Distance (Feet): 160 Feet Assistive device: Rolling walker (2 wheeled) Gait Pattern/deviations: Step-through pattern;Decreased step length - right;Decreased step length - left;Wide base of support;Trunk flexed Gait velocity: decr Gait velocity interpretation: Below normal speed for age/gender General Gait Details: Verbal cues to stand more erect.   Stairs            Wheelchair Mobility    Modified Rankin (Stroke Patients Only)       Balance           Standing balance support: Single extremity supported;During functional activity Standing balance-Leahy Scale: Poor                      Cognition Arousal/Alertness: Awake/alert Behavior During Therapy: WFL for tasks assessed/performed Overall Cognitive Status: Within Functional Limits for tasks assessed                      Exercises      General Comments        Pertinent Vitals/Pain See flow sheet.    Home Living                      Prior Function            PT Goals (current goals can now be found in the care plan section) Progress towards PT goals: Progressing toward goals     Frequency  Min 3X/week    PT Plan Current plan remains appropriate    Co-evaluation             End of Session Equipment Utilized During Treatment: Gait belt Activity Tolerance: Patient tolerated treatment well Patient left: in chair;with call bell/phone within reach     Time: 3299-2426 PT Time Calculation (min): 35 min  Charges:  $Gait Training: 23-37 mins                    G Codes:      [place] 02-23-2014, 1:37 PM  [place]

## 2014-02-05 NOTE — Progress Notes (Signed)
Clinical Social Worker facilitated patient discharge by contacting the patient, family and facility, [facility]. Patient agreeable to this plan and arranging transport via EMS . CSW will sign off, as social work intervention is no longer needed.  Jeanette Caprice, MSW, [place]

## 2014-02-05 NOTE — Progress Notes (Signed)
Patient ID: [name], male   DOB: [birth year], 68 y.o.   MRN: [id number] 8 Days Post-Op  Subjective: Patient seems quite forgetful this morning.  He is hungry.  He is passing flatus. C/o some pain  Objective: Vital signs in last 24 hours: Temp:  [97.4 F (36.3 C)-99.3 F (37.4 C)] 97.4 F (36.3 C) (04/28 0438) Pulse Rate:  [84-98] 87 (04/28 0438) Resp:  [20] 20 (04/28 0438) BP: (114-151)/(52-74) 121/71 mmHg (04/28 0438) SpO2:  [92 %-97 %] 97 % (04/28 0438) Weight:  [240 lb 8.4 oz (109.1 kg)] 240 lb 8.4 oz (109.1 kg) (04/28 0438) Last BM Date: 02/04/14  Intake/Output from previous day: 04/27 0701 - 04/28 0700 In: 480 [P.O.:480] Out: 1751 [Urine:1750; Stool:1] Intake/Output this shift:    PE: Abd: soft, minimally tender, +BS, ND, incision c/d/i with staples  Lab Results:  No results found for this basename: WBC, HGB, HCT, PLT,  in the last 72 hours BMET No results found for this basename: NA, K, CL, CO2, GLUCOSE, BUN, CREATININE, CALCIUM,  in the last 72 hours PT/INR No results found for this basename: LABPROT, INR,  in the last 72 hours CMP     Component Value Date/Time   NA 141 02/02/2014 0520   K 4.2 02/02/2014 0520   CL 99 02/02/2014 0520   CO2 28 02/02/2014 0520   GLUCOSE 118* 02/02/2014 0520   BUN 47* 02/02/2014 0520   CREATININE 0.87 02/02/2014 0520   CALCIUM 9.4 02/02/2014 0520   PROT 8.0 01/28/2014 0535   ALBUMIN 3.4* 01/28/2014 0535   AST 17 01/28/2014 0535   ALT 10 01/28/2014 0535   ALKPHOS 81 01/28/2014 0535   BILITOT 0.5 01/28/2014 0535   GFRNONAA 85* 02/02/2014 0520   GFRAA >90 02/02/2014 0520   Lipase     Component Value Date/Time   LIPASE 41 12/25/2012 1452       Studies/Results: Dg Abd 1 View  02/04/2014   CLINICAL DATA:  Abdominal pain.  EXAM: ABDOMEN - 1 VIEW  COMPARISON:  Abdominal radiograph 01/17/2014.  FINDINGS: Gas and stool are noted throughout the colon. No pathologic distention of small bowel. There are a few nondilated loops of gas-filled  small bowel noted throughout the abdomen. No definite pneumoperitoneum on this single supine view of the abdomen. Several surgical clips project over the right hip.  IMPRESSION: 1. Nonspecific, nonobstructive bowel gas pattern, as above. 2. No pneumoperitoneum.   Electronically Signed   By: Vinnie Langton M.D.   On: 02/04/2014 16:18    Anti-infectives: Anti-infectives   Start     Dose/Rate Route Frequency Ordered Stop   01/28/14 1700  ceFAZolin (ANCEF) IVPB 2 g/50 mL premix     2 g 100 mL/hr over 30 Minutes Intravenous 3 times per day 01/28/14 1455 01/29/14 0820       Assessment/Plan  1.  POD 8, HERNIA REPAIR UMBILICAL ADULT/INC (N/A)  EXPLORATORY LAPAROTOMY (N/A)  SMALL BOWEL RESECTION (N/A)  Plan: 1. Patient's film from yesterday is normal.  He c/o pain, but this is to be expected for several weeks due to the type of operation he had. His pain is well controlled.  He is tolerating a diet and passing flatus and stool from below.   2. DC staples today 3. [place] for dc to SNF today from our standpoint  LOS: 8 days    Henreitta Cea 02/05/2014, 8:00 AM Pager: [phone]

## 2014-02-05 NOTE — Progress Notes (Signed)
I have seen and examined the pt and agree with PA-Osborne's progress note.  

## 2014-02-05 NOTE — Progress Notes (Signed)
Pt called requesting pain medication.  RN to get medication from pyxis, when returning to administer pt is sleeping.  Will hold medication at time.  Will continue to monitor pt closely.  Call bell within reach.

## 2014-02-06 ENCOUNTER — Encounter (HOSPITAL_COMMUNITY): Payer: Medicare Other

## 2014-02-08 ENCOUNTER — Encounter: Payer: Self-pay | Admitting: Internal Medicine

## 2014-02-08 ENCOUNTER — Non-Acute Institutional Stay (SKILLED_NURSING_FACILITY): Payer: Medicare Other | Admitting: Internal Medicine

## 2014-02-08 ENCOUNTER — Other Ambulatory Visit: Payer: Self-pay | Admitting: *Deleted

## 2014-02-08 DIAGNOSIS — E1149 Type 2 diabetes mellitus with other diabetic neurological complication: Secondary | ICD-10-CM

## 2014-02-08 DIAGNOSIS — K5900 Constipation, unspecified: Secondary | ICD-10-CM

## 2014-02-08 DIAGNOSIS — I5032 Chronic diastolic (congestive) heart failure: Secondary | ICD-10-CM

## 2014-02-08 DIAGNOSIS — E1142 Type 2 diabetes mellitus with diabetic polyneuropathy: Secondary | ICD-10-CM

## 2014-02-08 DIAGNOSIS — R5383 Other fatigue: Secondary | ICD-10-CM

## 2014-02-08 DIAGNOSIS — K56609 Unspecified intestinal obstruction, unspecified as to partial versus complete obstruction: Secondary | ICD-10-CM

## 2014-02-08 DIAGNOSIS — F3289 Other specified depressive episodes: Secondary | ICD-10-CM

## 2014-02-08 DIAGNOSIS — E039 Hypothyroidism, unspecified: Secondary | ICD-10-CM

## 2014-02-08 DIAGNOSIS — R531 Weakness: Secondary | ICD-10-CM

## 2014-02-08 DIAGNOSIS — F329 Major depressive disorder, single episode, unspecified: Secondary | ICD-10-CM

## 2014-02-08 DIAGNOSIS — F32A Depression, unspecified: Secondary | ICD-10-CM

## 2014-02-08 DIAGNOSIS — R5381 Other malaise: Secondary | ICD-10-CM

## 2014-02-08 MED ORDER — OXYCODONE HCL 5 MG PO TABS: 5.0000 mg | ORAL_TABLET | ORAL | Status: DC | PRN

## 2014-02-08 NOTE — Progress Notes (Signed)
Patient ID: [name], male   DOB: 1946-03-11, 68 y.o.   MRN: [id number]     [name] living [name]   PCP: Jene Every, MD  Code Status: full code  Allergies  Allergen Reactions  . Erythromycin Hives    Chief Complaint: new admit  HPI:  68 y/o male patient is here for STR after hospital admission from 01/28/14- 02/05/14 with small bowel obstruction and umbilical hernia strangulation. He underwent exploratory laprotomy with small bowel resection and repair of the hernia. He is seen in his room. He complaints of generalized weakness. He has been working with therapy and is able to go from restroom to his bed with the help of the walker. He has been passing gas but has not had bowel movement for 2 days. Denies abdominal pain, nausea or vomiting. He has hx of copd, type 2 dm, chf  Review of Systems:  Constitutional: Negative for fever, chills, weight loss, diaphoresis.  HENT: Negative for congestion, hearing loss and sore throat.   Eyes: Negative for eye pain, blurred vision, double vision and discharge.  Respiratory: Negative for cough, sputum production, shortness of breath and wheezing.   Cardiovascular: Negative for chest pain, palpitations, orthopnea   Gastrointestinal: Negative for heartburn, nausea, vomiting, abdominal pain, diarrhea  Genitourinary: Negative for dysuria, urgency, frequency, hematuria and flank pain.  Musculoskeletal: Negative for back pain, falls, joint pain and myalgias.  Skin: Negative for itching and rash.  Neurological: Negative for tingling, focal weakness and headaches. has some dizziness with sudden change of position and generalized weakness Psychiatric/Behavioral: Negative for depression and memory loss. The patient is not nervous/anxious.     Past Medical History  Diagnosis Date  . CHRONIC OBSTRUCTIVE PULMONARY DISEASE 06/20/2009  . OBSTRUCTIVE SLEEP APNEA 06/20/2009  . CAROTID STENOSIS 06/20/2009    A. 08/2001 s/p L CEA;  B.   09/14/11 -  Carotid U/S - 40-59% bilateral stenosis, left CEA patch angioplasty is patent  . DM 06/20/2009  . CAD 06/20/2009    A.  08/2000 - s/p CABG x 4 - LIMA-LAD, Left Radial-OM, VG-DIAG, VG-RCA;  B. Neg. MV  2010  . HYPERLIPIDEMIA 06/20/2009  . HYPERTENSION 06/20/2009  . Hypothyroidism   . Low back pain   . Asthma     as child  . Pneumonia   . Atrial fibrillation     Not felt to be coumadin candidate 2/2 ETOH use.  Marland Kitchen ETOH abuse   . History of tobacco abuse     remote - quit 1970  . Bilateral renal cysts   . Marijuana abuse   . Morbidly obese   . CHF (congestive heart failure)   . Falls frequently    Past Surgical History  Procedure Laterality Date  . Carotid endarterectomy  2002    left  . Coronary artery bypass graft      x 4 - 2001  . Umbilical hernia repair N/A 01/28/2014    Procedure: HERNIA REPAIR UMBILICAL ADULT/INC;  Surgeon: Harl Bowie, MD;  Location: [place];  Service: General;  Laterality: N/A;  . Laparotomy N/A 01/28/2014    Procedure: EXPLORATORY LAPAROTOMY;  Surgeon: Harl Bowie, MD;  Location: [place];  Service: General;  Laterality: N/A;  . Bowel resection N/A 01/28/2014    Procedure: SMALL BOWEL RESECTION;  Surgeon: Harl Bowie, MD;  Location: [place];  Service: General;  Laterality: N/A;   Social History:   reports that he quit smoking about 45 years ago. He does not have any  smokeless tobacco history on file. He reports that he drinks alcohol. He reports that he uses illicit drugs (Marijuana).  Family History  Problem Relation Age of Onset  . Stroke Mother     ?  . Stroke Father     ?    Medications: Patient's Medications  New Prescriptions   No medications on file  Previous Medications   ASPIRIN 81 MG CHEWABLE TABLET    Chew 1 tablet (81 mg total) by mouth daily.   BISACODYL (DULCOLAX) 5 MG EC TABLET    Take 1 tablet (5 mg total) by mouth daily as needed for moderate constipation.   CARVEDILOL (COREG) 3.125 MG TABLET    Take 3.125 mg by mouth  2 (two) times daily with a meal. Take with the carvedilol 6.25mg  twice daily   FEEDING SUPPLEMENT, ENSURE COMPLETE, (ENSURE COMPLETE) LIQD    Take 237 mLs by mouth 2 (two) times daily between meals.   FLUOXETINE (PROZAC) 20 MG CAPSULE    Take 20 mg by mouth daily.     FLUTICASONE (FLOVENT HFA) 110 MCG/ACT INHALER    Inhale 1 puff into the lungs 2 (two) times daily.   GABAPENTIN (NEURONTIN) 300 MG CAPSULE    Take 1 capsule (300 mg total) by mouth daily.   HYDROCORTISONE (ANUSOL-HC) 2.5 % RECTAL CREAM    Place rectally 3 (three) times daily as needed for hemorrhoids or itching.   INSULIN GLARGINE (LANTUS SOLOSTAR) 100 UNIT/ML SOLOSTAR PEN    Inject 14 Units into the skin at bedtime.   LACTULOSE (CHRONULAC) 10 GM/15ML SOLUTION    Take 22.5 mLs (15 g total) by mouth daily as needed for mild constipation.   LEVOTHYROXINE (SYNTHROID, LEVOTHROID) 150 MCG TABLET    Take 150 mcg by mouth at bedtime.    OMEPRAZOLE (PRILOSEC) 20 MG CAPSULE    Take 20 mg by mouth daily.     SIMVASTATIN (ZOCOR) 40 MG TABLET    Take 40 mg by mouth at bedtime.     SPIRONOLACTONE (ALDACTONE) 25 MG TABLET    Take 1 tablet (25 mg total) by mouth daily.  Modified Medications   Modified Medication Previous Medication   OXYCODONE (OXY IR/ROXICODONE) 5 MG IMMEDIATE RELEASE TABLET oxyCODONE (OXY IR/ROXICODONE) 5 MG immediate release tablet      Take 1 tablet (5 mg total) by mouth every 4 (four) hours as needed for severe pain.    Take 1 tablet (5 mg total) by mouth every 4 (four) hours as needed for severe pain.  Discontinued Medications   No medications on file     Physical Exam: Filed Vitals:   02/08/14 1212  BP: 140/80  Pulse: 89  Temp: 98.4 F (36.9 C)  Resp: 18  Height: 5\' 10"  (1.778 m)  Weight: 241 lb (109.317 kg)  SpO2: 91%    General- elderly male in no acute distress, obese Head- atraumatic, normocephalic Eyes- PERRLA, EOMI, no pallor, no icterus, no discharge Neck- no lymphadenopathy, no  thyromegaly Cardiovascular- normal s1,s2, no murmurs/ rubs/ gallops Respiratory- bilateral clear to auscultation, no wheeze, no rhonchi, no crackles, no use of accessory muscles, on o2 2l/min Abdomen- bowel sounds present, soft, non tender, steri strips in place on abdomen, no guarding or rigidity, no CVA tenderness Musculoskeletal- able to move all 4 extremities, using walker, trace leg edema with chronic statsis changes of skin on legs  Neurological- no focal deficit Skin- warm and dry Psychiatry- alert and oriented to person, place and time, normal mood and affect  Labs reviewed: Basic Metabolic Panel:  Recent Labs  04/24/13 0400  01/28/14 0535  01/31/14 0410 02/01/14 0432 02/02/14 0520  NA 132*  < > 141  < > 146 142 141  K 3.4*  < > 3.5*  < > 4.4 3.9 4.2  CL 82*  < > 92*  < > 98 96 99  CO2 39*  < > 33*  < > 32 34* 28  GLUCOSE 168*  < > 211*  < > 100* 128* 118*  BUN 87*  < > 90*  < > 88* 63* 47*  CREATININE 1.48*  < > 1.06  < > 1.60* 1.14 0.87  CALCIUM 9.9  < > 10.3  < > 9.9 9.6 9.4  MG 1.8  --  1.7  --   --   --   --   < > = values in this interval not displayed. Liver Function Tests:  Recent Labs  04/14/13 1320 01/28/14 0535  AST 42* 17  ALT 16 10  ALKPHOS 92 81  BILITOT 0.7 0.5  PROT 7.4 8.0  ALBUMIN 3.4* 3.4*   No results found for this basename: LIPASE, AMYLASE,  in the last 8760 hours No results found for this basename: AMMONIA,  in the last 8760 hours CBC:  Recent Labs  04/14/13 1320  01/28/14 0535  01/31/14 0410 02/01/14 0432 02/02/14 0520  WBC 7.0  < > 9.3  < > 6.9 6.0 6.8  NEUTROABS 4.4  --  7.4  --   --   --   --   HGB 11.3*  < > 13.2  < > 11.5* 11.4* 11.3*  HCT 36.1*  < > 41.0  < > 37.5* 36.2* 36.8*  MCV 85.7  < > 88.9  < > 93.1 91.2 92.2  PLT 126*  < > 164  < > 152 143* 148*  < > = values in this interval not displayed. Cardiac Enzymes:  Recent Labs  04/14/13 1301 04/14/13 1854 04/15/13 0105  TROPONINI <0.30 <0.30 <0.30    BNP: No components found with this basename: POCBNP,  CBG:  Recent Labs  02/04/14 2202 02/05/14 0636 02/05/14 1144  GLUCAP 215* 125* 137*   Lab Results  Component Value Date   HGBA1C 6.7* 04/19/2013    Radiological Exams: Dg Abd 1 View  02/04/2014   CLINICAL DATA:  Abdominal pain.  EXAM: ABDOMEN - 1 VIEW  COMPARISON:  Abdominal radiograph 01/17/2014.  FINDINGS: Gas and stool are noted throughout the colon. No pathologic distention of small bowel. There are a few nondilated loops of gas-filled small bowel noted throughout the abdomen. No definite pneumoperitoneum on this single supine view of the abdomen. Several surgical clips project over the right hip.  IMPRESSION: 1. Nonspecific, nonobstructive bowel gas pattern, as above. 2. No pneumoperitoneum.   Electronically Signed   By: Vinnie Langton M.D.   On: 02/04/2014 16:18    Assessment/Plan  Generalized weakness In setting of recent surgery causing the deconditioning. Will have him work with PT and OT to strengthen muscle groups and steady the gait. Continue feeding supplement. Continue o2 by nasal canula  Small bowel obstruction S/p ex lap and hernia repair. Monitor bowel movement. Will avoid straining. Adjustment made to bowel regimen, see below. Continue prn oxycodone for severe pain only and continue skin care. Has follow up with surgery  Constipation Continue dulcolax but will change it to daily basis from prn. Continue anusol and lactulose  chf euvolemic at present. Monitor daily weight and to  have fluid restriction of 2l/min. Continue aldactone with carvedilol and aspirin. Has upcoming f/u with cardiology  Depression Stable. Continue rpozac for now  Hypothyroidism No chnages made to home regimen of levothyroxine for now  gerd Continue omeprazole for now  Dm type 2 with neuropathy Continue lantu 14 u sq daily, monitor cbg. Will have him take his gabapentin for pain   Family/ staff Communication: reviewed care  plan with patient and nursing supervisor   Goals of care: short term rehabilitation   Labs/tests ordered: cbc, bmp    Blanchie Serve, MD  [facility] [phone] (Monday-Friday 8 am - 5 pm) [phone] (afterhours)

## 2014-02-08 NOTE — Telephone Encounter (Signed)
Medication refill to [facility].

## 2014-02-10 DIAGNOSIS — R531 Weakness: Secondary | ICD-10-CM | POA: Insufficient documentation

## 2014-02-14 ENCOUNTER — Non-Acute Institutional Stay (SKILLED_NURSING_FACILITY): Payer: Medicare Other | Admitting: Internal Medicine

## 2014-02-14 ENCOUNTER — Encounter: Payer: Self-pay | Admitting: Internal Medicine

## 2014-02-14 DIAGNOSIS — I5032 Chronic diastolic (congestive) heart failure: Secondary | ICD-10-CM

## 2014-02-14 DIAGNOSIS — E1165 Type 2 diabetes mellitus with hyperglycemia: Secondary | ICD-10-CM

## 2014-02-14 DIAGNOSIS — E1159 Type 2 diabetes mellitus with other circulatory complications: Secondary | ICD-10-CM

## 2014-02-14 DIAGNOSIS — D649 Anemia, unspecified: Secondary | ICD-10-CM

## 2014-02-14 DIAGNOSIS — F32A Depression, unspecified: Secondary | ICD-10-CM

## 2014-02-14 DIAGNOSIS — E039 Hypothyroidism, unspecified: Secondary | ICD-10-CM

## 2014-02-14 DIAGNOSIS — F3289 Other specified depressive episodes: Secondary | ICD-10-CM

## 2014-02-14 DIAGNOSIS — R5383 Other fatigue: Secondary | ICD-10-CM

## 2014-02-14 DIAGNOSIS — R5381 Other malaise: Secondary | ICD-10-CM

## 2014-02-14 DIAGNOSIS — IMO0002 Reserved for concepts with insufficient information to code with codable children: Secondary | ICD-10-CM

## 2014-02-14 DIAGNOSIS — K56609 Unspecified intestinal obstruction, unspecified as to partial versus complete obstruction: Secondary | ICD-10-CM

## 2014-02-14 DIAGNOSIS — R531 Weakness: Secondary | ICD-10-CM

## 2014-02-14 DIAGNOSIS — F329 Major depressive disorder, single episode, unspecified: Secondary | ICD-10-CM

## 2014-02-14 DIAGNOSIS — E1151 Type 2 diabetes mellitus with diabetic peripheral angiopathy without gangrene: Secondary | ICD-10-CM

## 2014-02-14 DIAGNOSIS — K5900 Constipation, unspecified: Secondary | ICD-10-CM

## 2014-02-14 NOTE — Progress Notes (Signed)
Patient ID: [name], male   DOB: May 12, 1946, 68 y.o.   MRN: [id number]    [facility] living [facility]  Chief Complaint  Patient presents with  . Discharge Note    discharge visit   Allergies  Allergen Reactions  . Erythromycin Hives   HPI:   68 y/o male patient is here for STR after hospital admission with small bowel obstruction and umbilical hernia strangulation. He is s/p exploratory laprotomy with small bowel resection and repair of the hernia. He has been working with therapy team here. The swelling in his leg has increased. His breathing has been stable. He denies any complaints today. Evidently patient was not receiving his coreg that he was on at discharge in the facility for unclear reason. He has regular bowel movement  Review of Systems:  Constitutional: Negative for fever, chills, weight loss, diaphoresis. Weight remains stable. E was 245 on arrival to the facility and 246 lb today HENT: Negative for congestion, hearing loss and sore throat.   Eyes: Negative for eye pain, blurred vision, double vision and discharge.  Respiratory: Negative for cough, sputum production, shortness of breath and wheezing. On prn 02 by nasal canula Cardiovascular: Negative for chest pain, palpitations, orthopnea   Gastrointestinal: Negative for heartburn, nausea, vomiting, abdominal pain, diarrhea   Genitourinary: Negative for dysuria, urgency, frequency, hematuria and flank pain.  Musculoskeletal: Negative for back pain, falls, joint pain and myalgias.  Skin: Negative for itching and rash.  Neurological: Negative for tingling, focal weakness and headaches. has some dizziness with sudden change of position and generalized weakness Psychiatric/Behavioral: Negative for depression and memory loss. The patient is not nervous/anxious.    Past Medical History  Diagnosis Date  . CHRONIC OBSTRUCTIVE PULMONARY DISEASE 06/20/2009  . OBSTRUCTIVE SLEEP APNEA 06/20/2009  . CAROTID STENOSIS 06/20/2009   A. 08/2001 s/p L CEA;  B.   09/14/11 - Carotid U/S - 40-59% bilateral stenosis, left CEA patch angioplasty is patent  . DM 06/20/2009  . CAD 06/20/2009    A.  08/2000 - s/p CABG x 4 - LIMA-LAD, Left Radial-OM, VG-DIAG, VG-RCA;  B. Neg. MV  2010  . HYPERLIPIDEMIA 06/20/2009  . HYPERTENSION 06/20/2009  . Hypothyroidism   . Low back pain   . Asthma     as child  . Pneumonia   . Atrial fibrillation     Not felt to be coumadin candidate 2/2 ETOH use.  Marland Kitchen ETOH abuse   . History of tobacco abuse     remote - quit 1970  . Bilateral renal cysts   . Marijuana abuse   . Morbidly obese   . CHF (congestive heart failure)   . Falls frequently    Past Surgical History  Procedure Laterality Date  . Carotid endarterectomy  2002    left  . Coronary artery bypass graft      x 4 - 2001  . Umbilical hernia repair N/A 01/28/2014    Procedure: HERNIA REPAIR UMBILICAL ADULT/INC;  Surgeon: Harl Bowie, MD;  Location: [place];  Service: General;  Laterality: N/A;  . Laparotomy N/A 01/28/2014    Procedure: EXPLORATORY LAPAROTOMY;  Surgeon: Harl Bowie, MD;  Location: [place];  Service: General;  Laterality: N/A;  . Bowel resection N/A 01/28/2014    Procedure: SMALL BOWEL RESECTION;  Surgeon: Harl Bowie, MD;  Location: [place];  Service: General;  Laterality: N/A;   Outpatient Encounter Prescriptions as of 02/14/2014  Medication Sig  . aspirin 81 MG chewable tablet  Chew 1 tablet (81 mg total) by mouth daily.  Marland Kitchen atorvastatin (LIPITOR) 20 MG tablet Take 20 mg by mouth daily.  . bisacodyl (DULCOLAX) 5 MG EC tablet Take 1 tablet (5 mg total) by mouth daily as needed for moderate constipation.  . carvedilol (COREG) 3.125 MG tablet Take 3.125 mg by mouth 2 (two) times daily with a meal. With 6.5 mg bid  . carvedilol (COREG) 6.25 MG tablet Take 6.25 mg by mouth 2 (two) times daily with a meal.  . FLUoxetine (PROZAC) 20 MG capsule Take 20 mg by mouth daily.    . fluticasone (FLOVENT HFA) 110 MCG/ACT  inhaler Inhale 1 puff into the lungs 2 (two) times daily.  Marland Kitchen gabapentin (NEURONTIN) 300 MG capsule Take 1 capsule (300 mg total) by mouth daily.  . hydrocortisone (ANUSOL-HC) 2.5 % rectal cream Place rectally 3 (three) times daily as needed for hemorrhoids or itching.  . Insulin Glargine (LANTUS SOLOSTAR) 100 UNIT/ML Solostar Pen Inject 14 Units into the skin at bedtime.  Marland Kitchen lactulose (CHRONULAC) 10 GM/15ML solution Take 22.5 mLs (15 g total) by mouth daily as needed for mild constipation.  Marland Kitchen levothyroxine (SYNTHROID, LEVOTHROID) 150 MCG tablet Take 150 mcg by mouth at bedtime.   Marland Kitchen omeprazole (PRILOSEC) 20 MG capsule Take 20 mg by mouth daily.    Marland Kitchen oxyCODONE (OXY IR/ROXICODONE) 5 MG immediate release tablet Take 1 tablet (5 mg total) by mouth every 4 (four) hours as needed for severe pain.  Marland Kitchen spironolactone (ALDACTONE) 25 MG tablet Take 1 tablet (25 mg total) by mouth daily.  Marland Kitchen torsemide (DEMADEX) 20 MG tablet Take 20 mg by mouth daily.  . [DISCONTINUED] carvedilol (COREG) 3.125 MG tablet Take 3.125 mg by mouth 2 (two) times daily with a meal. Take with the carvedilol 6.25mg  twice daily  . [DISCONTINUED] feeding supplement, ENSURE COMPLETE, (ENSURE COMPLETE) LIQD Take 237 mLs by mouth 2 (two) times daily between meals.  . [DISCONTINUED] simvastatin (ZOCOR) 40 MG tablet Take 40 mg by mouth at bedtime.     Physical exam BP 140/80  Pulse 74  Temp(Src) 97.5 F (36.4 C)  Resp 18  Ht 5\' 10"  (1.778 m)  Wt 246 lb (111.585 kg)  BMI 35.30 kg/m2  SpO2 96%  General- elderly male in no acute distress, obese Head- atraumatic, normocephalic Eyes- PERRLA, EOMI, no pallor, no icterus, no discharge Neck- no lymphadenopathy, no thyromegaly Cardiovascular- normal s1,s2, no murmurs/ rubs/ gallops Respiratory- bilateral clear to auscultation, no wheeze, no rhonchi, no crackles, no use of accessory muscles, on o2 2l/min Abdomen- bowel sounds present, soft, non tender, steri strips in place on abdomen, no  guarding or rigidity, no CVA tenderness Musculoskeletal- able to move all 4 extremities, using walker, 1+ leg edema with chronic statsis changes of skin on legs   Neurological- no focal deficit Skin- warm and dry, incision site healing well Psychiatry- alert and oriented to person, place and time, normal mood and affect  Labs-  Lab Results  Component Value Date   CREATININE 0.87 02/02/2014   Lab Results  Component Value Date   WBC 6.8 02/02/2014   HGB 11.3* 02/02/2014   HCT 36.8* 02/02/2014   MCV 92.2 02/02/2014   PLT 148* 02/02/2014   02/11/14 Wbc 8.2, hb 9.5, hct 29.9, plt 219, na 138, k 4.2, bun 17, cr 0.85, glu 93, ca 8.0  Assessment/Plan  Generalized weakness In setting of recent surgery causing the deconditioning. Has made improvement with therapy team. Continue PT and OT services at home.  Will provide script on his medications. Fluid restriction and weight monitoring. Continue o2 by nasal canula  chf Increased leg edema noted. Weight is stable. Will restart his home demadex at 20 mg daily. Script provided. Also continue aldactone with coreg and aspirin. Monitor daily weight and to have fluid restriction of 2l/min. Has home RN services  Small bowel obstruction S/p ex lap and hernia repair. Monitor bowel movement. Will avoid straining. Continue prn oxycodone for severe pain only and continue skin care.   Constipation Continue dulcolax with anusol and lactulose  Dm type 2 with neuropathy Continue lantus 14 u sq daily, monitor cbg. Will have him take his gabapentin for pain  Depression Stable. Continue proozac for now  Hypothyroidism Continue levothyroxine for now  gerd Continue omeprazole for now  Anemia Monitor h/h, likely post surgical procedure

## 2014-02-18 ENCOUNTER — Encounter (INDEPENDENT_AMBULATORY_CARE_PROVIDER_SITE_OTHER): Payer: Self-pay | Admitting: Surgery

## 2014-02-18 ENCOUNTER — Ambulatory Visit (INDEPENDENT_AMBULATORY_CARE_PROVIDER_SITE_OTHER): Payer: Medicare Other | Admitting: Surgery

## 2014-02-18 VITALS — BP 133/89 | HR 76 | Temp 98.6°F | Resp 18 | Ht 69.0 in | Wt 242.2 lb

## 2014-02-18 DIAGNOSIS — Z5189 Encounter for other specified aftercare: Secondary | ICD-10-CM

## 2014-02-18 DIAGNOSIS — R269 Unspecified abnormalities of gait and mobility: Secondary | ICD-10-CM

## 2014-02-18 DIAGNOSIS — I5032 Chronic diastolic (congestive) heart failure: Secondary | ICD-10-CM

## 2014-02-18 DIAGNOSIS — Z09 Encounter for follow-up examination after completed treatment for conditions other than malignant neoplasm: Secondary | ICD-10-CM

## 2014-02-18 DIAGNOSIS — J4489 Other specified chronic obstructive pulmonary disease: Secondary | ICD-10-CM

## 2014-02-18 DIAGNOSIS — J449 Chronic obstructive pulmonary disease, unspecified: Secondary | ICD-10-CM

## 2014-02-18 DIAGNOSIS — I10 Essential (primary) hypertension: Secondary | ICD-10-CM

## 2014-02-18 DIAGNOSIS — E119 Type 2 diabetes mellitus without complications: Secondary | ICD-10-CM

## 2014-02-18 NOTE — Progress Notes (Signed)
Subjective:     Patient ID: [name], male   DOB: 05/18/1946, 68 y.o.   MRN: [id number]  HPI He is here for his first postoperative visit status post emergent repair of an incarcerated and strangulated umbilical hernia necessitating a bowel resection. He is now doing well. He is eating well moving his bowels well. He is home from rehabilitation  Review of Systems     Objective:   Physical Exam On exam, he is in a wheelchair. He is awake and alert. His abdomen is soft and his incision is well healed    Assessment:     Patient stable postop     Plan:     He will refrain from any vigorous activity or lifting for 4 more weeks. I will see him back as needed

## 2014-02-19 ENCOUNTER — Ambulatory Visit (HOSPITAL_COMMUNITY): Payer: Medicare Other | Attending: Cardiovascular Disease | Admitting: Cardiology

## 2014-02-19 ENCOUNTER — Encounter: Payer: Self-pay | Admitting: Cardiovascular Disease

## 2014-02-19 ENCOUNTER — Ambulatory Visit (INDEPENDENT_AMBULATORY_CARE_PROVIDER_SITE_OTHER): Payer: Medicare Other | Admitting: Cardiovascular Disease

## 2014-02-19 VITALS — BP 98/50 | HR 60 | Ht 69.0 in | Wt 241.0 lb

## 2014-02-19 DIAGNOSIS — I5032 Chronic diastolic (congestive) heart failure: Secondary | ICD-10-CM

## 2014-02-19 DIAGNOSIS — I4891 Unspecified atrial fibrillation: Secondary | ICD-10-CM

## 2014-02-19 DIAGNOSIS — I6529 Occlusion and stenosis of unspecified carotid artery: Secondary | ICD-10-CM

## 2014-02-19 DIAGNOSIS — I2510 Atherosclerotic heart disease of native coronary artery without angina pectoris: Secondary | ICD-10-CM

## 2014-02-19 LAB — BASIC METABOLIC PANEL
BUN: 32 mg/dL — ABNORMAL HIGH (ref 6–23)
CHLORIDE: 99 meq/L (ref 96–112)
CO2: 30 mEq/L (ref 19–32)
Calcium: 8.8 mg/dL (ref 8.4–10.5)
Creatinine, Ser: 1.4 mg/dL (ref 0.4–1.5)
GFR: 53.22 mL/min — ABNORMAL LOW (ref >60.00)
Glucose, Bld: 146 mg/dL — ABNORMAL HIGH (ref 70–99)
POTASSIUM: 5 meq/L (ref 3.5–5.1)
Sodium: 139 mEq/L (ref 135–145)

## 2014-02-19 MED ORDER — TORSEMIDE 20 MG PO TABS: 80.0000 mg | ORAL_TABLET | Freq: Two times a day (BID) | ORAL | Status: DC

## 2014-02-19 MED ORDER — METOLAZONE 2.5 MG PO TABS: ORAL_TABLET | ORAL | Status: DC

## 2014-02-19 NOTE — Progress Notes (Signed)
History of Present Illness: 68 yo WM with history of CAD s/p 4V CABG 2001, atrial fibrillation, COPD, OSA, obesity, venous insufficiency and diastolic CHF here for cardiac follow up. He has been followed by Dr. Olevia Perches. I saw him for the first time in May 2012. He had bypass surgery in 2001. He had a negative Myoview scan in 2010. In 2010 he developed paroxysmal fibrillation and has been in persistent atrial fibrillation. He was not felt to be a Coumadin candidate because of alcohol consumption. He did have a [place] score 3. Carotid artery dopplers December 2012 with stable bilateral disease (40-59% bilateral stenosis, left CEA patch angioplasty is patent). He was admitted to [facility] December 2012 with pneumonia, volume overload, AMS, had mild bump in troponin. This was felt to be demand ischemia.  He has been followed in the CHF clinic. Last ECHO 08/2012 EF 55-60% moderate bilateral RA/LA enlargement. Admitted to [facility] 04/14/13 through 04/27/13 with massive volume overload. Sluggish diuresis with intermittent lasix and he was transitioned to Lasix drip and dopamine drip at 3 mcg per hour. Diuresis was sluggish until diamox added with marked improvement. Later switched to torsemide 80 mg in am and 40 mg in pm. Diuresed over 40 pounds. Discharge weight was 230 pounds. Stress myoview 06/27/13 without ischemia. He was seen in the CHF clinic 01/17/14. Weight was up and diuretics were adjusted. Admitted to [facility] April 2015 with small bowel obstruction. His diuretics were held in [facility] by the surgery team due to renal failure and he has only been taking 20 mg of torsemide per day in the nursing home. He came home Friday of last week. He is now taking torsemide 40 mg per day. He thinks his abdomen is swollen. Also notes LE edema. Moderate bilateral ICA stenosis, 60-79% by dopplers today  He is here today for follow up.  Increased LE edema and increased abdominal girth. No change in breathing. No chest pain.     Primary Care Physician: Jene Every  Past Medical History  Diagnosis Date  . CHRONIC OBSTRUCTIVE PULMONARY DISEASE 06/20/2009  . OBSTRUCTIVE SLEEP APNEA 06/20/2009  . CAROTID STENOSIS 06/20/2009    A. 08/2001 s/p L CEA;  B.   09/14/11 - Carotid U/S - 40-59% bilateral stenosis, left CEA patch angioplasty is patent  . DM 06/20/2009  . CAD 06/20/2009    A.  08/2000 - s/p CABG x 4 - LIMA-LAD, Left Radial-OM, VG-DIAG, VG-RCA;  B. Neg. MV  2010  . HYPERLIPIDEMIA 06/20/2009  . HYPERTENSION 06/20/2009  . Hypothyroidism   . Low back pain   . Asthma     as child  . Pneumonia   . Atrial fibrillation     Not felt to be coumadin candidate 2/2 ETOH use.  Marland Kitchen ETOH abuse   . History of tobacco abuse     remote - quit 1970  . Bilateral renal cysts   . Marijuana abuse   . Morbidly obese   . CHF (congestive heart failure)   . Falls frequently     Past Surgical History  Procedure Laterality Date  . Carotid endarterectomy  2002    left  . Coronary artery bypass graft      x 4 - 2001  . Umbilical hernia repair N/A 01/28/2014    Procedure: HERNIA REPAIR UMBILICAL ADULT/INC;  Surgeon: Harl Bowie, MD;  Location: [place];  Service: General;  Laterality: N/A;  . Laparotomy N/A 01/28/2014    Procedure: EXPLORATORY LAPAROTOMY;  Surgeon:  Harl Bowie, MD;  Location: [place];  Service: General;  Laterality: N/A;  . Bowel resection N/A 01/28/2014    Procedure: SMALL BOWEL RESECTION;  Surgeon: Harl Bowie, MD;  Location: [place];  Service: General;  Laterality: N/A;  . Hernia repair      Current Outpatient Prescriptions  Medication Sig Dispense Refill  . aspirin 81 MG chewable tablet Chew 1 tablet (81 mg total) by mouth daily.  30 tablet  1  . atorvastatin (LIPITOR) 20 MG tablet Take 20 mg by mouth daily.      . bisacodyl (DULCOLAX) 5 MG EC tablet Take 1 tablet (5 mg total) by mouth daily as needed for moderate constipation.  30 tablet  0  . carvedilol (COREG) 3.125 MG tablet Take 3.125 mg  by mouth 2 (two) times daily with a meal. With 6.5 mg bid      . carvedilol (COREG) 6.25 MG tablet Take 6.25 mg by mouth 2 (two) times daily with a meal.      . FLUoxetine (PROZAC) 20 MG capsule Take 20 mg by mouth daily.        . fluticasone (FLOVENT HFA) 110 MCG/ACT inhaler Inhale 1 puff into the lungs 2 (two) times daily.      Marland Kitchen gabapentin (NEURONTIN) 300 MG capsule Take 1 capsule (300 mg total) by mouth daily.      Marland Kitchen glucose blood (BAYER CONTOUR TEST) test strip USE TO TEST BLOOD SUGAR TWICE DAILY      . hydrocortisone (ANUSOL-HC) 2.5 % rectal cream Place rectally 3 (three) times daily as needed for hemorrhoids or itching.  30 g  0  . hydrocortisone 2.5 % cream       . Insulin Glargine (LANTUS SOLOSTAR) 100 UNIT/ML Solostar Pen Inject 14 Units into the skin at bedtime.      Marland Kitchen lactulose (CHRONULAC) 10 GM/15ML solution Take 22.5 mLs (15 g total) by mouth daily as needed for mild constipation.  240 mL  0  . levothyroxine (SYNTHROID, LEVOTHROID) 150 MCG tablet Take 150 mcg by mouth at bedtime.       Marland Kitchen omeprazole (PRILOSEC) 20 MG capsule Take 20 mg by mouth daily.        Marland Kitchen oxyCODONE (OXY IR/ROXICODONE) 5 MG immediate release tablet Take 1 tablet (5 mg total) by mouth every 4 (four) hours as needed for severe pain.  20 tablet  0  . spironolactone (ALDACTONE) 25 MG tablet Take 1 tablet (25 mg total) by mouth daily.  30 tablet  6  . torsemide (DEMADEX) 20 MG tablet Take 20 mg by mouth daily.       No current facility-administered medications for this visit.    Allergies  Allergen Reactions  . Erythromycin Hives  . Erythromycin Base     Other reaction(s): HIVES    History   Social History  . Marital Status: Single    Spouse Name: N/A    Number of Children: N/A  . Years of Education: N/A   Occupational History  . retired    Social History Main Topics  . Smoking status: Former Smoker    Quit date: 10/11/1968  . Smokeless tobacco: Not on file  . Alcohol Use: Yes     Comment: wild  [place] daily;  also reports regularly drinking a pint of vodka.  . Drug Use: Yes    Special: Marijuana     Comment: last 1 year ago  . Sexual Activity: Not on file   Other Topics Concern  .  Not on file   Social History Narrative   Lives in [place] with dtr, son-in-law    Family History  Problem Relation Age of Onset  . Stroke Mother     ?  . Stroke Father     ?    Review of Systems:  As stated in the HPI and otherwise negative.   BP 98/50  Pulse 60  Ht 5\' 9"  (1.753 m)  Wt 241 lb (109.317 kg)  BMI 35.57 kg/m2  Physical Examination: General: Well developed, well nourished, NAD HEENT: OP clear, mucus membranes moist SKIN: warm, dry. No rashes. Neuro: No focal deficits Musculoskeletal: Muscle strength 5/5 all ext Psychiatric: Mood and affect normal Neck: No JVD, no carotid bruits, no thyromegaly, no lymphadenopathy. Lungs:Clear bilaterally, no wheezes, rhonci, crackles Cardiovascular: Regular rate and rhythm. No murmurs, gallops or rubs. Abdomen:Soft. Bowel sounds present. Non-tender.  Extremities: No lower extremity edema. Pulses are 2 + in the bilateral DP/PT.  EKG: Atrial fib, rate 65 bpm.   Assessment and Plan:   1. CAD: Stable. Stress myoview 06/27/13 without ischemia. Continue current meds.   2. Atrial fibrillation: Rate controlled. Not a good coumadin candidate. We have discussed the newer agents but felt to be poor candidate with history of alcohol abuse. Continue current therapy.  3. Chronic DIASTOLIC HEART FAILURE:  Clinically volume overloaded today with increased LE edema. Will resume his prior diuretic regimen which is torsemide 80 mg po BID and metolazone 2.5 mg po once weekly. Check BMET today. Keep f/u as planned in CHF clinic next week.   4. Carotid artery disease: Moderate bilateral ICA stenosis, 60-79% by dopplers today. Repeat in 6 months.

## 2014-02-19 NOTE — Patient Instructions (Signed)
Your physician wants you to follow-up in:  6 months.  You will receive a reminder letter in the mail two months in advance. If you don't receive a letter, please call our office to schedule the follow-up appointment.  Your physician has recommended you make the following change in your medication:   Resume Torsemide 80 mg by mouth twice daily. Resume metolazone 2.5 mg by mouth once a week (on Mondays)

## 2014-02-20 NOTE — Progress Notes (Signed)
Carotid duplex

## 2014-02-22 ENCOUNTER — Other Ambulatory Visit: Payer: Self-pay | Admitting: *Deleted

## 2014-02-22 DIAGNOSIS — I6529 Occlusion and stenosis of unspecified carotid artery: Secondary | ICD-10-CM

## 2014-02-25 ENCOUNTER — Encounter (HOSPITAL_COMMUNITY): Payer: Medicare Other

## 2014-04-20 ENCOUNTER — Emergency Department (HOSPITAL_COMMUNITY): Payer: Medicare Other

## 2014-04-20 ENCOUNTER — Inpatient Hospital Stay (HOSPITAL_COMMUNITY)
Admission: EM | Admit: 2014-04-20 | Discharge: 2014-04-29 | DRG: 292 | Disposition: A | Discharge status: Home Health | Payer: Medicare Other | Attending: Cardiovascular Disease | Admitting: Cardiovascular Disease

## 2014-04-20 ENCOUNTER — Encounter (HOSPITAL_COMMUNITY): Payer: Self-pay | Admitting: Emergency Medicine

## 2014-04-20 DIAGNOSIS — J449 Chronic obstructive pulmonary disease, unspecified: Secondary | ICD-10-CM | POA: Diagnosis present

## 2014-04-20 DIAGNOSIS — I6529 Occlusion and stenosis of unspecified carotid artery: Secondary | ICD-10-CM | POA: Diagnosis present

## 2014-04-20 DIAGNOSIS — R042 Hemoptysis: Secondary | ICD-10-CM | POA: Diagnosis present

## 2014-04-20 DIAGNOSIS — Z9181 History of falling: Secondary | ICD-10-CM

## 2014-04-20 DIAGNOSIS — I872 Venous insufficiency (chronic) (peripheral): Secondary | ICD-10-CM | POA: Diagnosis present

## 2014-04-20 DIAGNOSIS — Z7982 Long term (current) use of aspirin: Secondary | ICD-10-CM | POA: Diagnosis not present

## 2014-04-20 DIAGNOSIS — I2510 Atherosclerotic heart disease of native coronary artery without angina pectoris: Secondary | ICD-10-CM | POA: Diagnosis present

## 2014-04-20 DIAGNOSIS — I509 Heart failure, unspecified: Secondary | ICD-10-CM

## 2014-04-20 DIAGNOSIS — Z888 Allergy status to other drugs, medicaments and biological substances status: Secondary | ICD-10-CM | POA: Diagnosis not present

## 2014-04-20 DIAGNOSIS — Z6835 Body mass index (BMI) 35.0-35.9, adult: Secondary | ICD-10-CM

## 2014-04-20 DIAGNOSIS — I4891 Unspecified atrial fibrillation: Secondary | ICD-10-CM | POA: Diagnosis present

## 2014-04-20 DIAGNOSIS — D649 Anemia, unspecified: Secondary | ICD-10-CM | POA: Diagnosis present

## 2014-04-20 DIAGNOSIS — E785 Hyperlipidemia, unspecified: Secondary | ICD-10-CM | POA: Diagnosis present

## 2014-04-20 DIAGNOSIS — Z6833 Body mass index (BMI) 33.0-33.9, adult: Secondary | ICD-10-CM

## 2014-04-20 DIAGNOSIS — G609 Hereditary and idiopathic neuropathy, unspecified: Secondary | ICD-10-CM | POA: Diagnosis present

## 2014-04-20 DIAGNOSIS — E039 Hypothyroidism, unspecified: Secondary | ICD-10-CM | POA: Diagnosis present

## 2014-04-20 DIAGNOSIS — I959 Hypotension, unspecified: Secondary | ICD-10-CM | POA: Diagnosis not present

## 2014-04-20 DIAGNOSIS — R609 Edema, unspecified: Secondary | ICD-10-CM

## 2014-04-20 DIAGNOSIS — N19 Unspecified kidney failure: Secondary | ICD-10-CM

## 2014-04-20 DIAGNOSIS — Z79899 Other long term (current) drug therapy: Secondary | ICD-10-CM | POA: Diagnosis not present

## 2014-04-20 DIAGNOSIS — L989 Disorder of the skin and subcutaneous tissue, unspecified: Secondary | ICD-10-CM | POA: Diagnosis present

## 2014-04-20 DIAGNOSIS — E1022 Type 1 diabetes mellitus with diabetic chronic kidney disease: Secondary | ICD-10-CM

## 2014-04-20 DIAGNOSIS — N185 Chronic kidney disease, stage 5: Secondary | ICD-10-CM | POA: Diagnosis present

## 2014-04-20 DIAGNOSIS — I5043 Acute on chronic combined systolic (congestive) and diastolic (congestive) heart failure: Secondary | ICD-10-CM | POA: Diagnosis present

## 2014-04-20 DIAGNOSIS — R0600 Dyspnea, unspecified: Secondary | ICD-10-CM

## 2014-04-20 DIAGNOSIS — N189 Chronic kidney disease, unspecified: Secondary | ICD-10-CM

## 2014-04-20 DIAGNOSIS — IMO0001 Reserved for inherently not codable concepts without codable children: Secondary | ICD-10-CM | POA: Diagnosis present

## 2014-04-20 DIAGNOSIS — R17 Unspecified jaundice: Secondary | ICD-10-CM | POA: Diagnosis present

## 2014-04-20 DIAGNOSIS — K5900 Constipation, unspecified: Secondary | ICD-10-CM | POA: Diagnosis present

## 2014-04-20 DIAGNOSIS — Z87891 Personal history of nicotine dependence: Secondary | ICD-10-CM

## 2014-04-20 DIAGNOSIS — IMO0002 Reserved for concepts with insufficient information to code with codable children: Secondary | ICD-10-CM

## 2014-04-20 DIAGNOSIS — Z951 Presence of aortocoronary bypass graft: Secondary | ICD-10-CM | POA: Diagnosis not present

## 2014-04-20 DIAGNOSIS — Z794 Long term (current) use of insulin: Secondary | ICD-10-CM | POA: Diagnosis not present

## 2014-04-20 DIAGNOSIS — J4489 Other specified chronic obstructive pulmonary disease: Secondary | ICD-10-CM | POA: Diagnosis present

## 2014-04-20 DIAGNOSIS — E119 Type 2 diabetes mellitus without complications: Secondary | ICD-10-CM | POA: Diagnosis present

## 2014-04-20 DIAGNOSIS — E1165 Type 2 diabetes mellitus with hyperglycemia: Secondary | ICD-10-CM

## 2014-04-20 DIAGNOSIS — I5033 Acute on chronic diastolic (congestive) heart failure: Secondary | ICD-10-CM

## 2014-04-20 DIAGNOSIS — G4733 Obstructive sleep apnea (adult) (pediatric): Secondary | ICD-10-CM | POA: Diagnosis present

## 2014-04-20 DIAGNOSIS — I5032 Chronic diastolic (congestive) heart failure: Secondary | ICD-10-CM

## 2014-04-20 DIAGNOSIS — I110 Hypertensive heart disease with heart failure: Secondary | ICD-10-CM

## 2014-04-20 DIAGNOSIS — E871 Hypo-osmolality and hyponatremia: Secondary | ICD-10-CM | POA: Diagnosis present

## 2014-04-20 DIAGNOSIS — N179 Acute kidney failure, unspecified: Secondary | ICD-10-CM | POA: Diagnosis present

## 2014-04-20 DIAGNOSIS — F1010 Alcohol abuse, uncomplicated: Secondary | ICD-10-CM | POA: Diagnosis present

## 2014-04-20 DIAGNOSIS — I482 Chronic atrial fibrillation, unspecified: Secondary | ICD-10-CM

## 2014-04-20 DIAGNOSIS — R34 Anuria and oliguria: Secondary | ICD-10-CM | POA: Diagnosis present

## 2014-04-20 DIAGNOSIS — E1151 Type 2 diabetes mellitus with diabetic peripheral angiopathy without gangrene: Secondary | ICD-10-CM

## 2014-04-20 LAB — HEPATIC FUNCTION PANEL
ALT: <5 U/L (ref 0–53)
Albumin: 0.2 g/dL — ABNORMAL LOW (ref 3.5–5.2)
Alkaline Phosphatase: 5 U/L — ABNORMAL LOW (ref 39–117)
Total Protein: 0.2 g/dL — ABNORMAL LOW (ref 6.0–8.3)

## 2014-04-20 LAB — URINALYSIS, ROUTINE W REFLEX MICROSCOPIC
BILIRUBIN URINE: NEGATIVE
Glucose, UA: NEGATIVE mg/dL
Ketones, ur: 15 mg/dL — AB
Leukocytes, UA: NEGATIVE
NITRITE: NEGATIVE
Protein, ur: 30 mg/dL — AB
SPECIFIC GRAVITY, URINE: 1.02 (ref 1.005–1.030)
Urobilinogen, UA: 1 mg/dL (ref 0.0–1.0)
pH: 5 (ref 5.0–8.0)

## 2014-04-20 LAB — BASIC METABOLIC PANEL
Anion gap: 20 — ABNORMAL HIGH (ref 5–15)
BUN: 105 mg/dL — AB (ref 6–23)
CO2: 28 mEq/L (ref 19–32)
CREATININE: 3.74 mg/dL — AB (ref 0.50–1.35)
Calcium: 8.8 mg/dL (ref 8.4–10.5)
Chloride: 83 mEq/L — ABNORMAL LOW (ref 96–112)
GFR, EST AFRICAN AMERICAN: 17 mL/min — AB (ref >90)
GFR, EST NON AFRICAN AMERICAN: 15 mL/min — AB (ref >90)
Glucose, Bld: 144 mg/dL — ABNORMAL HIGH (ref 70–99)
Potassium: 3.9 mEq/L (ref 3.7–5.3)
Sodium: 131 mEq/L — ABNORMAL LOW (ref 137–147)

## 2014-04-20 LAB — CBC
HEMATOCRIT: 34.6 % — AB (ref 39.0–52.0)
Hemoglobin: 10.8 g/dL — ABNORMAL LOW (ref 13.0–17.0)
MCH: 27.1 pg (ref 26.0–34.0)
MCHC: 31.2 g/dL (ref 30.0–36.0)
MCV: 86.9 fL (ref 78.0–100.0)
Platelets: 132 10*3/uL — ABNORMAL LOW (ref 150–400)
RBC: 3.98 MIL/uL — ABNORMAL LOW (ref 4.22–5.81)
RDW: 16 % — AB (ref 11.5–15.5)
WBC: 5.9 10*3/uL (ref 4.0–10.5)

## 2014-04-20 LAB — PRO B NATRIURETIC PEPTIDE: Pro B Natriuretic peptide (BNP): 17870 pg/mL — ABNORMAL HIGH (ref 0–125)

## 2014-04-20 LAB — URINE MICROSCOPIC-ADD ON

## 2014-04-20 LAB — CREATININE, URINE, RANDOM: Creatinine, Urine: 70.68 mg/dL

## 2014-04-20 LAB — SODIUM, URINE, RANDOM: Sodium, Ur: 22 mEq/L

## 2014-04-20 LAB — I-STAT TROPONIN, ED: Troponin i, poc: 0.02 ng/mL (ref 0.00–0.08)

## 2014-04-20 MED ORDER — FUROSEMIDE 10 MG/ML IJ SOLN
120.0000 mg | Freq: Two times a day (BID) | INTRAVENOUS | Status: DC
  Filled 2014-04-20: qty 12

## 2014-04-20 MED ORDER — FUROSEMIDE 10 MG/ML IJ SOLN
40.0000 mg | Freq: Once | INTRAMUSCULAR | Status: AC
  Administered 2014-04-20: 40 mg via INTRAVENOUS
  Filled 2014-04-20: qty 4

## 2014-04-20 MED ORDER — FUROSEMIDE 10 MG/ML IJ SOLN
80.0000 mg | Freq: Once | INTRAMUSCULAR | Status: AC
  Administered 2014-04-20: 80 mg via INTRAVENOUS
  Filled 2014-04-20: qty 8

## 2014-04-20 NOTE — ED Notes (Signed)
Reports having sob and swelling to legs for several days, reports his fluid pills havent been working and he hasnt been urinating. Has ace bandages applied to bilateral legs pta by home health. Has discoloration to bilateral toes.

## 2014-04-20 NOTE — ED Provider Notes (Signed)
CSN: [id number]     Arrival date & time 04/20/14  1604 History   First MD Initiated Contact with Patient 04/20/14 1712     Chief Complaint  Patient presents with  . Shortness of Breath  . Leg Swelling     (Consider location/radiation/quality/duration/timing/severity/associated sxs/prior Treatment) The history is provided by the patient and medical records.    [name] this 68 year old male with a past medical history of COPD, OSA,, diabetes, hypertension peripheral arterial disease, coronary artery disease, atrial fibrillation, alcohol abuse, and chronic diastolic heart failure. He is seen by Dr. Amanda Pea for cardiology. The patient complains of worsening leg swelling over the past week. He states that he takes 40 mg of Lasix twice a day and was also recently started on 2.5 mg of metolazone which she takes once a week on Mondays. The patient has had a 7 pound weight gain in the past 4 days. He complains of shortness of breath at rest. He also has noticed decreased urinary output for the past 4 days. He states this morning he made only a small amount of urine despite taking his Lasix as prescribed. The patient has also noticed cough productive of white frothy sputum as well as intermittent hemoptysis. He states he was seen in [place] for his hemoptysis but does not know the outcome. Patient denies fevers, chills, soaking night sweats, or weight loss.  Past Medical History  Diagnosis Date  . CHRONIC OBSTRUCTIVE PULMONARY DISEASE 06/20/2009  . OBSTRUCTIVE SLEEP APNEA 06/20/2009  . CAROTID STENOSIS 06/20/2009    A. 08/2001 s/p L CEA;  B.   09/14/11 - Carotid U/S - 40-59% bilateral stenosis, left CEA patch angioplasty is patent  . DM 06/20/2009  . CAD 06/20/2009    A.  08/2000 - s/p CABG x 4 - LIMA-LAD, Left Radial-OM, VG-DIAG, VG-RCA;  B. Neg. MV  2010  . HYPERLIPIDEMIA 06/20/2009  . HYPERTENSION 06/20/2009  . Hypothyroidism   . Low back pain   . Asthma     as child  . Pneumonia   .  Atrial fibrillation     Not felt to be coumadin candidate 2/2 ETOH use.  Marland Kitchen ETOH abuse   . History of tobacco abuse     remote - quit 1970  . Bilateral renal cysts   . Marijuana abuse   . Morbidly obese   . CHF (congestive heart failure)   . Falls frequently    Past Surgical History  Procedure Laterality Date  . Carotid endarterectomy  2002    left  . Coronary artery bypass graft      x 4 - 2001  . Umbilical hernia repair N/A 01/28/2014    Procedure: HERNIA REPAIR UMBILICAL ADULT/INC;  Surgeon: Harl Bowie, MD;  Location: [place];  Service: General;  Laterality: N/A;  . Laparotomy N/A 01/28/2014    Procedure: EXPLORATORY LAPAROTOMY;  Surgeon: Harl Bowie, MD;  Location: [place];  Service: General;  Laterality: N/A;  . Bowel resection N/A 01/28/2014    Procedure: SMALL BOWEL RESECTION;  Surgeon: Harl Bowie, MD;  Location: [place];  Service: General;  Laterality: N/A;  . Hernia repair     Family History  Problem Relation Age of Onset  . Stroke Mother     ?  . Stroke Father     ?   History  Substance Use Topics  . Smoking status: Former Smoker    Quit date: 10/11/1968  . Smokeless tobacco: Not on file  .  Alcohol Use: Yes     Comment: wild [place] daily;  also reports regularly drinking a pint of vodka.    Review of Systems  Ten systems reviewed and are negative for acute change, except as noted in the HPI.    Allergies  Erythromycin  Home Medications   Prior to Admission medications   Medication Sig Start Date End Date Taking? Authorizing Provider  aspirin 81 MG chewable tablet Chew 1 tablet (81 mg total) by mouth daily. 02/05/14  Yes Kelvin Cellar, MD  atorvastatin (LIPITOR) 20 MG tablet Take 20 mg by mouth daily.   Yes Historical Provider, MD  carvedilol (COREG) 3.125 MG tablet Take 3.125 mg by mouth 2 (two) times daily with a meal. With 6.5 mg bid   Yes Historical Provider, MD  carvedilol (COREG) 6.25 MG tablet Take 6.25 mg by mouth 2 (two) times  daily with a meal.   Yes Historical Provider, MD  colchicine 0.6 MG tablet Take 0.6 mg by mouth daily as needed (for gout).   Yes Historical Provider, MD  FLUoxetine (PROZAC) 20 MG capsule Take 20 mg by mouth daily.     Yes Historical Provider, MD  fluticasone (FLOVENT HFA) 110 MCG/ACT inhaler Inhale 1 puff into the lungs 2 (two) times daily.   Yes Historical Provider, MD  gabapentin (NEURONTIN) 300 MG capsule Take 1 capsule (300 mg total) by mouth daily. 09/07/12  Yes Srikar Janna Arch, MD  glucose blood (BAYER CONTOUR TEST) test strip USE TO TEST BLOOD SUGAR TWICE DAILY 01/23/14  Yes Historical Provider, MD  hydrocortisone (ANUSOL-HC) 2.5 % rectal cream Place rectally 3 (three) times daily as needed for hemorrhoids or itching. 02/05/14  Yes Kelvin Cellar, MD  Insulin Glargine (LANTUS SOLOSTAR) 100 UNIT/ML Solostar Pen Inject 16 Units into the skin at bedtime.    Yes Historical Provider, MD  lactulose (CHRONULAC) 10 GM/15ML solution Take 20 g by mouth 2 (two) times daily.   Yes Historical Provider, MD  levothyroxine (SYNTHROID, LEVOTHROID) 150 MCG tablet Take 150 mcg by mouth at bedtime.    Yes Historical Provider, MD  metolazone (ZAROXOLYN) 2.5 MG tablet Take one tablet by mouth once a week (on Mondays) 02/19/14  Yes Burnell Blanks, MD  Multiple Vitamin (MULTIVITAMIN WITH MINERALS) TABS tablet Take 1 tablet by mouth daily.   Yes Historical Provider, MD  omeprazole (PRILOSEC) 20 MG capsule Take 20 mg by mouth daily.     Yes Historical Provider, MD  Oxycodone HCl 10 MG TABS Take 10 mg by mouth every 8 (eight) hours as needed (for pain).   Yes Historical Provider, MD  senna (SENOKOT) 8.6 MG TABS tablet Take 1 tablet by mouth 2 (two) times daily as needed for mild constipation.   Yes Historical Provider, MD  spironolactone (ALDACTONE) 25 MG tablet Take 1 tablet (25 mg total) by mouth daily. 08/06/13  Yes Jolaine Artist, MD  torsemide (DEMADEX) 20 MG tablet Take 4 tablets (80 mg total) by mouth  2 (two) times daily. 02/19/14  Yes Burnell Blanks, MD   BP 133/80  Pulse 61  Temp(Src) 97.4 F (36.3 C) (Oral)  Resp 23  SpO2 93% Physical Exam  Nursing note and vitals reviewed. Constitutional: No distress.  appears chronically ill. Sallow skin.  HENT:  Head: Normocephalic and atraumatic.  Eyes: EOM are normal. Pupils are equal, round, and reactive to light. Scleral icterus is present.  Neck: Normal range of motion. JVD present.  Cardiovascular:  3+ pitting edema. Skin tear to R  2nd toe. Blood blister R great toe.  afib   Pulmonary/Chest: Effort normal. Rales: BL course crackles.  Bilateral coarse and fine crackles heard from the middle section of the lungs down. Patient found out that by the end of his sentences.  Abdominal: Soft. He exhibits no distension. There is no tenderness.  Skin: He is not diaphoretic.  Refill in bilateral lower extremities greater than 2 seconds. Bandage was removed and this is documented with Dopplers     ED Course  Procedures (including critical care time) Labs Review Labs Reviewed  CBC - Abnormal; Notable for the following:    RBC 3.98 (*)    Hemoglobin 10.8 (*)    HCT 34.6 (*)    RDW 16.0 (*)    Platelets 132 (*)    All other components within normal limits  BASIC METABOLIC PANEL - Abnormal; Notable for the following:    Sodium 131 (*)    Chloride 83 (*)    Glucose, Bld 144 (*)    Creatinine, Ser 3.74 (*)    GFR calc non Af Amer 15 (*)    GFR calc Af Amer 17 (*)    Anion gap 20 (*)    All other components within normal limits  PRO B NATRIURETIC PEPTIDE - Abnormal; Notable for the following:    Pro B Natriuretic peptide (BNP) 17870.0 (*)    All other components within normal limits  I-STAT TROPOININ, ED    Imaging Review Dg Chest 2 View  04/20/2014   CLINICAL DATA:  Bilateral lower extremity swelling for 4 days.  EXAM: CHEST  2 VIEW  COMPARISON:  CT chest 04/18/2014.  PA and lateral chest 04/15/2014.  FINDINGS: There is  cardiomegaly without edema. No pneumothorax or pleural fluid. The patient is status post CABG.  IMPRESSION: Cardiomegaly without acute disease.   Electronically Signed   By: Inge Rise M.D.   On: 04/20/2014 17:24     EKG Interpretation   Date/Time:  Saturday April 20 2014 16:28:25 EDT Ventricular Rate:  61 PR Interval:    QRS Duration: 92 QT Interval:  448 QTC Calculation: 450 R Axis:   147 Text Interpretation:  Atrial fibrillation Incomplete right bundle branch  block Right ventricular hypertrophy Nonspecific ST and T wave abnormality  , probably digitalis effect Abnormal ECG No significant change was found  Confirmed by Wyvonnia Dusky  MD, STEPHEN [phone]) on 04/20/2014 5:39:35 PM      MDM   Final diagnoses:  CHF exacerbation  Kidney failure  Acute uremia   6:43 PM BP 136/56  Pulse 51  Temp(Src) 97.4 F (36.3 C) (Oral)  Resp 17  SpO2 97% Patient here with acute CHF exacerbation. He has an iodine Metabolic acidosis which is likely secondary to his acute uremia. Patient appears to be in acute kidney failure. GFR is markedly decreased. Potassium appears within normal range. His pro BNP is nearly 18,000. Although his chest x-ray shows no pulmonary edema patient has bilateral crackles in lungs and this appears to have pulmonary edema. Patient is in atrial fibrillation which is not new. No interventions have been done here in the emergency department as of yet. I placed a call to cardiology as well as nephrology to consult on the patient's condition. EKG is unchanged from previous.  Marland Kitchen6:50 PM BP 136/56  Pulse 51  Temp(Src) 97.4 F (36.3 C) (Oral)  Resp 17  SpO2 97% I spoke with Dr. Posey Pronto of nephrology who asks that we begin lasix 80 mg IV. He will  consult on the patient.  7:14 PM BP 136/56  Pulse 51  Temp(Src) 97.4 F (36.3 C) (Oral)  Resp 17  SpO2 97% I have with the cardiology fellow Dr. Tommi Rumps. He will come to the hospital and consult on the page. He has not confirmed  that he will admit the patient yet as he may ask for medicine at night. Asked to add on a hepatic function panel panel as the patient does appear to have some scleral icterus. Patient remained stable in the ED.  10:32 PM BP 154/81  Pulse 77  Temp(Src) 97.4 F (36.3 C) (Oral)  Resp 18  SpO2 94% Patient still awaiting admission as the cardiology fellow wanted to make sure that this was not an intrarenal issue like ATN. FENa 0.9% and appears to be a prerenal cause. Still awaiting UA.  Patient's UA returned without casts or other signs of intrarenal abnormality. The patient will be admitted by cardiology fellow for his CHF exacerbation which is likely the cause of his AKI. The patient appears reasonably stabilized for admission considering the current resources, flow, and capabilities available in the ED at this time, and I doubt any other [facility] requiring further screening and/or treatment in the ED prior to admission.   I personally reviewed the imaging tests through PACS system. I have reviewed and interpreted Lab values. I reviewed available ER/hospitalization records through the EMR   The patient appears reasonably stabilized for admission considering the current resources, flow, and capabilities available in the ED at this time, and I doubt any other [facility] requiring further screening and/or treatment in the ED prior to admission.   Margarita Mail, PA-C 04/23/14 1702

## 2014-04-20 NOTE — ED Notes (Signed)
Patient transported to X-ray 

## 2014-04-20 NOTE — Consult Note (Addendum)
Reason for Consult: Acute on chronic renal failure in a patient with decompensated heart failure/volume overload Referring Physician: Dr. Wyvonnia Dusky- ED physician  HPI:  68 year old Caucasian man with past medical history significant for chronic kidney disease stage III at baseline (creatinine 1.4-1.6), coronary artery disease status post quadruple vessel CABG, atrial fibrillation (not candidate for anticoagulation), COPD, OSA, PAD and diastolic heart failure. Reports that a few weeks ago, after a brief hospitalization during which diuretic therapy was interrupted-he was asked to restart torsemide 80 mg twice a day, spironolactone 25 mg daily and metolazone 2.5 mg once a week. He was doing well up until about a week or so ago when he started experiencing worsening pedal edema and exertional dyspnea. He also had cough with pink frothy sputum for which he was evaluated by his PCP in [place]. He denies any fever or chills and does not have any night sweats. He reports a weight gain of 7-8 pounds over the last 4-5 days. He does report occasional indiscretion with sodium but denies any nonsteroidal anti-inflammatory drugs. Has been monitoring his fluid intake and stays less than 2 L per day.   He reports decreased urinary output. Denies any nausea, vomiting or diarrhea and does not have any dysgeusia or changes to his mental status. Denies any itchiness or morning sickness type symptoms. He denies any chest pain and has shortness of breath when he lays flat on his back. Labs scanned to his chart from 04/01/14 show sodium 137, potassium 3.6, bicarbonate 38, BUN 76 and a creatinine of 1.4  Past Medical History  Diagnosis Date  . CHRONIC OBSTRUCTIVE PULMONARY DISEASE 06/20/2009  . OBSTRUCTIVE SLEEP APNEA 06/20/2009  . CAROTID STENOSIS 06/20/2009    A. 08/2001 s/p L CEA;  B.   09/14/11 - Carotid U/S - 40-59% bilateral stenosis, left CEA patch angioplasty is patent  . DM 06/20/2009  . CAD 06/20/2009    A.  08/2000 -  s/p CABG x 4 - LIMA-LAD, Left Radial-OM, VG-DIAG, VG-RCA;  B. Neg. MV  2010  . HYPERLIPIDEMIA 06/20/2009  . HYPERTENSION 06/20/2009  . Hypothyroidism   . Low back pain   . Asthma     as child  . Pneumonia   . Atrial fibrillation     Not felt to be coumadin candidate 2/2 ETOH use.  Marland Kitchen ETOH abuse   . History of tobacco abuse     remote - quit 1970  . Bilateral renal cysts   . Marijuana abuse   . Morbidly obese   . CHF (congestive heart failure)   . Falls frequently     Past Surgical History  Procedure Laterality Date  . Carotid endarterectomy  2002    left  . Coronary artery bypass graft      x 4 - 2001  . Umbilical hernia repair N/A 01/28/2014    Procedure: HERNIA REPAIR UMBILICAL ADULT/INC;  Surgeon: Harl Bowie, MD;  Location: [place];  Service: General;  Laterality: N/A;  . Laparotomy N/A 01/28/2014    Procedure: EXPLORATORY LAPAROTOMY;  Surgeon: Harl Bowie, MD;  Location: [place];  Service: General;  Laterality: N/A;  . Bowel resection N/A 01/28/2014    Procedure: SMALL BOWEL RESECTION;  Surgeon: Harl Bowie, MD;  Location: [place];  Service: General;  Laterality: N/A;  . Hernia repair      Family History  Problem Relation Age of Onset  . Stroke Mother     ?  . Stroke Father     ?  Social History:  reports that he quit smoking about 45 years ago. He does not have any smokeless tobacco history on file. He reports that he drinks alcohol. He reports that he uses illicit drugs (Marijuana).  Allergies:  Allergies  Allergen Reactions  . Erythromycin Hives    Medications: Scheduled:   Results for orders placed during the hospital encounter of 04/20/14 (from the past 48 hour(s))  CBC     Status: Abnormal   Collection Time    04/20/14  4:55 PM      Result Value Ref Range   WBC 5.9  4.0 - 10.5 K/uL   RBC 3.98 (*) 4.22 - 5.81 MIL/uL   Hemoglobin 10.8 (*) 13.0 - 17.0 g/dL   HCT 34.6 (*) 39.0 - 52.0 %   MCV 86.9  78.0 - 100.0 fL   MCH 27.1  26.0 - 34.0  pg   MCHC 31.2  30.0 - 36.0 g/dL   RDW 16.0 (*) 11.5 - 15.5 %   Platelets 132 (*) 150 - 400 K/uL  BASIC METABOLIC PANEL     Status: Abnormal   Collection Time    04/20/14  4:55 PM      Result Value Ref Range   Sodium 131 (*) 137 - 147 mEq/L   Potassium 3.9  3.7 - 5.3 mEq/L   Chloride 83 (*) 96 - 112 mEq/L   CO2 28  19 - 32 mEq/L   Glucose, Bld 144 (*) 70 - 99 mg/dL   BUN 105 (*) 6 - 23 mg/dL   Creatinine, Ser 3.74 (*) 0.50 - 1.35 mg/dL   Calcium 8.8  8.4 - 10.5 mg/dL   GFR calc non Af Amer 15 (*) >90 mL/min   GFR calc Af Amer 17 (*) >90 mL/min   Comment: (NOTE)     The eGFR has been calculated using the CKD EPI equation.     This calculation has not been validated in all clinical situations.     eGFR's persistently <90 mL/min signify possible Chronic Kidney     Disease.   Anion gap 20 (*) 5 - 15  PRO B NATRIURETIC PEPTIDE     Status: Abnormal   Collection Time    04/20/14  4:55 PM      Result Value Ref Range   Pro B Natriuretic peptide (BNP) 17870.0 (*) 0 - 125 pg/mL  HEPATIC FUNCTION PANEL     Status: Abnormal   Collection Time    04/20/14  4:55 PM      Result Value Ref Range   Total Protein 0.2 (*) 6.0 - 8.3 g/dL   Albumin 0.2 (*) 3.5 - 5.2 g/dL   AST <5  0 - 37 U/L   ALT <5  0 - 53 U/L   Alkaline Phosphatase 5 (*) 39 - 117 U/L   Total Bilirubin <0.2 (*) 0.3 - 1.2 mg/dL   Bilirubin, Direct <0.2  0.0 - 0.3 mg/dL   Indirect Bilirubin NOT CALCULATED  0.3 - 0.9 mg/dL  Randolm Idol, ED     Status: None   Collection Time    04/20/14  5:00 PM      Result Value Ref Range   Troponin i, poc 0.02  0.00 - 0.08 ng/mL   Comment 3            Comment: Due to the release kinetics of cTnI,     a negative result within the first hours     of the onset of symptoms does not  rule out     myocardial infarction with certainty.     If myocardial infarction is still suspected,     repeat the test at appropriate intervals.    Dg Chest 2 View  04/20/2014   CLINICAL DATA:  Bilateral  lower extremity swelling for 4 days.  EXAM: CHEST  2 VIEW  COMPARISON:  CT chest 04/18/2014.  PA and lateral chest 04/15/2014.  FINDINGS: There is cardiomegaly without edema. No pneumothorax or pleural fluid. The patient is status post CABG.  IMPRESSION: Cardiomegaly without acute disease.   Electronically Signed   By: Inge Rise M.D.   On: 04/20/2014 17:24    Review of Systems  Constitutional: Positive for malaise/fatigue. Negative for fever and chills.  HENT: Negative.   Eyes: Negative.   Respiratory: Positive for cough, hemoptysis and sputum production. Negative for shortness of breath and wheezing.        Pink frothy sputum  Cardiovascular: Positive for orthopnea and leg swelling. Negative for chest pain, palpitations and claudication.  Gastrointestinal: Negative.   Genitourinary: Negative.   Musculoskeletal: Negative.   Skin: Negative.   Neurological: Positive for weakness.       Chronic imbalance- uses rolling walker   Blood pressure 161/82, pulse 62, temperature 97.4 F (36.3 C), temperature source Oral, resp. rate 16, SpO2 97.00%. Physical Exam  Nursing note and vitals reviewed. Constitutional: He is oriented to person, place, and time. He appears well-developed and well-nourished. No distress.  HENT:  Head: Normocephalic and atraumatic.  Mouth/Throat: No oropharyngeal exudate.  Eyes: Conjunctivae and EOM are normal. Pupils are equal, round, and reactive to light.  Neck: Normal range of motion. Neck supple. JVD present. No thyromegaly present.  JVD to mastoid  Cardiovascular: Normal rate, regular rhythm and normal heart sounds.   No murmur heard. Respiratory: Effort normal. He has rales.  Bilateral lower 1/2 of lung fields  GI: Soft. Bowel sounds are normal. He exhibits no distension. There is no tenderness. There is no rebound.  Musculoskeletal: Normal range of motion. He exhibits edema.  2+ LE edema- left slightly >right  Neurological: He is alert and oriented to  person, place, and time. No cranial nerve deficit.  No asterixis  Skin: Skin is warm and dry. No rash noted. No erythema.  Psychiatric: He has a normal mood and affect.    Assessment/Plan: 1. Acute renal failure on chronic kidney disease stage III: Appears to be from decompensated congestive heart failure and the difficult situation of interstitial volume overload with decreased intravascular volume in patients with diastolic heart failure. I will check a urinalysis, quantify urine electrolytes and order renal ultrasound. Attempt management with furosemide 120 mg IV twice a day which is about 1-1/2 times his outpatient oral diuretic therapy dose. Avoid metolazone at this time due to hyponatremia and will also not use spironolactone at this point. He is at high risk for needing hemodialysis for volume management if diuretics ineffective/uremic symptoms should they arise and I have discussed this with him. Will defer the use of milrinone (to help improve diastolic relaxation) to cardiology should they perceive the utility of this medication. 2. Acute exacerbation of congestive heart failure: Attempt diuretic therapy, resume beta blockers due to prior evidence of diastolic heart failure. He does not appear to be in RVR as a cause of his heart failure. It is possible that minor sodium indiscretions may have contributed to this situation but likely diuretic resistance. Will possibly need repeat echo. 3. Hyponatremia: Appears to be multifactorial from  CHF exacerbation, acute renal failure and metolazone. Anticipate to improve with diuresis/CHF control and renal recovery. Poor prognostic indicator. Unclear if Tolvaptan would be efficacious at this GFR. 4. Anemia: Mild and likely associated with his multiple chronic diseases, check iron studies and consider ESA. No role for transfusion at this point.   Alamin Mccuiston K. 04/20/2014, 8:42 PM

## 2014-04-21 DIAGNOSIS — I509 Heart failure, unspecified: Secondary | ICD-10-CM

## 2014-04-21 DIAGNOSIS — R0609 Other forms of dyspnea: Secondary | ICD-10-CM

## 2014-04-21 DIAGNOSIS — N179 Acute kidney failure, unspecified: Secondary | ICD-10-CM

## 2014-04-21 DIAGNOSIS — I379 Nonrheumatic pulmonary valve disorder, unspecified: Secondary | ICD-10-CM

## 2014-04-21 DIAGNOSIS — N185 Chronic kidney disease, stage 5: Secondary | ICD-10-CM

## 2014-04-21 DIAGNOSIS — R0989 Other specified symptoms and signs involving the circulatory and respiratory systems: Secondary | ICD-10-CM

## 2014-04-21 DIAGNOSIS — E1022 Type 1 diabetes mellitus with diabetic chronic kidney disease: Secondary | ICD-10-CM | POA: Diagnosis present

## 2014-04-21 DIAGNOSIS — I5043 Acute on chronic combined systolic (congestive) and diastolic (congestive) heart failure: Principal | ICD-10-CM

## 2014-04-21 DIAGNOSIS — I5033 Acute on chronic diastolic (congestive) heart failure: Secondary | ICD-10-CM

## 2014-04-21 DIAGNOSIS — R609 Edema, unspecified: Secondary | ICD-10-CM

## 2014-04-21 DIAGNOSIS — I5032 Chronic diastolic (congestive) heart failure: Secondary | ICD-10-CM

## 2014-04-21 LAB — BASIC METABOLIC PANEL
Anion gap: 21 — ABNORMAL HIGH (ref 5–15)
BUN: 110 mg/dL — ABNORMAL HIGH (ref 6–23)
CALCIUM: 8.4 mg/dL (ref 8.4–10.5)
CHLORIDE: 84 meq/L — AB (ref 96–112)
CO2: 27 meq/L (ref 19–32)
Creatinine, Ser: 4.32 mg/dL — ABNORMAL HIGH (ref 0.50–1.35)
GFR calc Af Amer: 15 mL/min — ABNORMAL LOW (ref >90)
GFR calc non Af Amer: 13 mL/min — ABNORMAL LOW (ref >90)
GLUCOSE: 128 mg/dL — AB (ref 70–99)
Potassium: 3.8 mEq/L (ref 3.7–5.3)
SODIUM: 132 meq/L — AB (ref 137–147)

## 2014-04-21 LAB — GLUCOSE, CAPILLARY
GLUCOSE-CAPILLARY: 116 mg/dL — AB (ref 70–99)
GLUCOSE-CAPILLARY: 143 mg/dL — AB (ref 70–99)
GLUCOSE-CAPILLARY: 169 mg/dL — AB (ref 70–99)
Glucose-Capillary: 172 mg/dL — ABNORMAL HIGH (ref 70–99)
Glucose-Capillary: 142 mg/dL — ABNORMAL HIGH (ref 70–99)

## 2014-04-21 LAB — MAGNESIUM: MAGNESIUM: 1.7 mg/dL (ref 1.5–2.5)

## 2014-04-21 LAB — HEPATITIS B SURFACE ANTIGEN: Hepatitis B Surface Ag: NEGATIVE

## 2014-04-21 LAB — IRON AND TIBC
Iron: 29 ug/dL — ABNORMAL LOW (ref 42–135)
Saturation Ratios: 8 % — ABNORMAL LOW (ref 20–55)
TIBC: 352 ug/dL (ref 215–435)
UIBC: 323 ug/dL (ref 125–400)

## 2014-04-21 LAB — UREA NITROGEN, URINE: Urea Nitrogen, Ur: 242 mg/dL

## 2014-04-21 LAB — HEPATITIS B SURFACE ANTIBODY,QUALITATIVE: HEP B S AB: NEGATIVE

## 2014-04-21 LAB — FERRITIN: FERRITIN: 17 ng/mL — AB (ref 22–322)

## 2014-04-21 MED ORDER — OXYCODONE HCL 5 MG PO TABS
10.0000 mg | ORAL_TABLET | Freq: Three times a day (TID) | ORAL | Status: DC | PRN
  Administered 2014-04-21 – 2014-04-29 (×16): 10 mg via ORAL
  Filled 2014-04-21 (×17): qty 2

## 2014-04-21 MED ORDER — ACETAZOLAMIDE SODIUM 500 MG IJ SOLR
250.0000 mg | Freq: Once | INTRAMUSCULAR | Status: AC
  Administered 2014-04-21: 250 mg via INTRAVENOUS
  Filled 2014-04-21: qty 500

## 2014-04-21 MED ORDER — FLUTICASONE PROPIONATE HFA 110 MCG/ACT IN AERO
1.0000 | INHALATION_SPRAY | Freq: Two times a day (BID) | RESPIRATORY_TRACT | Status: DC
  Administered 2014-04-21 – 2014-04-29 (×14): 1 via RESPIRATORY_TRACT
  Filled 2014-04-21 (×2): qty 12

## 2014-04-21 MED ORDER — FLUOXETINE HCL 20 MG PO CAPS
20.0000 mg | ORAL_CAPSULE | Freq: Every day | ORAL | Status: DC
  Administered 2014-04-21 – 2014-04-29 (×9): 20 mg via ORAL
  Filled 2014-04-21 (×9): qty 1

## 2014-04-21 MED ORDER — INSULIN ASPART 100 UNIT/ML ~~LOC~~ SOLN
0.0000 [IU] | Freq: Three times a day (TID) | SUBCUTANEOUS | Status: DC
  Administered 2014-04-21: 3 [IU] via SUBCUTANEOUS
  Administered 2014-04-22: 2 [IU] via SUBCUTANEOUS
  Administered 2014-04-22: 0 [IU] via SUBCUTANEOUS
  Administered 2014-04-23 – 2014-04-26 (×4): 2 [IU] via SUBCUTANEOUS
  Administered 2014-04-26: 3 [IU] via SUBCUTANEOUS
  Administered 2014-04-27 – 2014-04-29 (×7): 2 [IU] via SUBCUTANEOUS

## 2014-04-21 MED ORDER — DEXTROSE 5 % IV SOLN
120.0000 mg | Freq: Two times a day (BID) | INTRAVENOUS | Status: DC
  Administered 2014-04-21: 120 mg via INTRAVENOUS
  Filled 2014-04-21 (×2): qty 12

## 2014-04-21 MED ORDER — SODIUM CHLORIDE 0.9 % IJ SOLN
3.0000 mL | Freq: Two times a day (BID) | INTRAMUSCULAR | Status: DC
  Administered 2014-04-21 – 2014-04-29 (×17): 3 mL via INTRAVENOUS

## 2014-04-21 MED ORDER — SODIUM CHLORIDE 0.9 % IV SOLN
1020.0000 mg | Freq: Once | INTRAVENOUS | Status: AC
  Administered 2014-04-21: 1020 mg via INTRAVENOUS
  Filled 2014-04-21: qty 34

## 2014-04-21 MED ORDER — SODIUM CHLORIDE 0.9 % IJ SOLN: 3.0000 mL | INTRAMUSCULAR | Status: DC | PRN

## 2014-04-21 MED ORDER — CARVEDILOL 6.25 MG PO TABS
6.2500 mg | ORAL_TABLET | Freq: Two times a day (BID) | ORAL | Status: DC
  Administered 2014-04-21 – 2014-04-29 (×14): 6.25 mg via ORAL
  Filled 2014-04-21 (×19): qty 1

## 2014-04-21 MED ORDER — ASPIRIN 81 MG PO CHEW
81.0000 mg | CHEWABLE_TABLET | Freq: Every day | ORAL | Status: DC
  Administered 2014-04-21 – 2014-04-29 (×9): 81 mg via ORAL
  Filled 2014-04-21 (×9): qty 1

## 2014-04-21 MED ORDER — HEPARIN SODIUM (PORCINE) 5000 UNIT/ML IJ SOLN
5000.0000 [IU] | Freq: Three times a day (TID) | INTRAMUSCULAR | Status: DC
  Administered 2014-04-21: 5000 [IU] via SUBCUTANEOUS
  Filled 2014-04-21 (×3): qty 1

## 2014-04-21 MED ORDER — ATORVASTATIN CALCIUM 20 MG PO TABS
20.0000 mg | ORAL_TABLET | Freq: Every day | ORAL | Status: DC
  Administered 2014-04-21 – 2014-04-29 (×9): 20 mg via ORAL
  Filled 2014-04-21 (×9): qty 1

## 2014-04-21 MED ORDER — ACETAMINOPHEN 325 MG PO TABS
650.0000 mg | ORAL_TABLET | ORAL | Status: DC | PRN
  Administered 2014-04-23 – 2014-04-28 (×9): 650 mg via ORAL
  Filled 2014-04-21 (×7): qty 2

## 2014-04-21 MED ORDER — LEVOTHYROXINE SODIUM 150 MCG PO TABS
150.0000 ug | ORAL_TABLET | Freq: Every day | ORAL | Status: DC
  Administered 2014-04-21 – 2014-04-28 (×9): 150 ug via ORAL
  Filled 2014-04-21 (×10): qty 1

## 2014-04-21 MED ORDER — LACTULOSE 10 GM/15ML PO SOLN
20.0000 g | Freq: Two times a day (BID) | ORAL | Status: DC
  Administered 2014-04-21 – 2014-04-29 (×10): 20 g via ORAL
  Filled 2014-04-21 (×19): qty 30

## 2014-04-21 MED ORDER — ONDANSETRON HCL 4 MG/2ML IJ SOLN
4.0000 mg | Freq: Four times a day (QID) | INTRAMUSCULAR | Status: DC | PRN
  Administered 2014-04-21 – 2014-04-22 (×3): 4 mg via INTRAVENOUS
  Filled 2014-04-21 (×3): qty 2

## 2014-04-21 MED ORDER — INSULIN GLARGINE 100 UNIT/ML ~~LOC~~ SOLN
16.0000 [IU] | Freq: Every day | SUBCUTANEOUS | Status: DC
  Administered 2014-04-22 – 2014-04-28 (×7): 16 [IU] via SUBCUTANEOUS
  Filled 2014-04-21 (×10): qty 0.16

## 2014-04-21 MED ORDER — PANTOPRAZOLE SODIUM 40 MG PO TBEC
80.0000 mg | DELAYED_RELEASE_TABLET | Freq: Every day | ORAL | Status: DC
  Administered 2014-04-21 – 2014-04-29 (×9): 80 mg via ORAL
  Filled 2014-04-21 (×14): qty 2

## 2014-04-21 MED ORDER — FUROSEMIDE 10 MG/ML IJ SOLN
120.0000 mg | Freq: Three times a day (TID) | INTRAVENOUS | Status: DC
  Administered 2014-04-21 – 2014-04-23 (×6): 120 mg via INTRAVENOUS
  Filled 2014-04-21 (×8): qty 12

## 2014-04-21 MED ORDER — SODIUM CHLORIDE 0.9 % IV SOLN: 250.0000 mL | INTRAVENOUS | Status: DC | PRN

## 2014-04-21 MED ORDER — ADULT MULTIVITAMIN W/MINERALS CH
1.0000 | ORAL_TABLET | Freq: Every day | ORAL | Status: DC
  Administered 2014-04-21 – 2014-04-29 (×9): 1 via ORAL
  Filled 2014-04-21 (×9): qty 1

## 2014-04-21 MED ORDER — HYDROCORTISONE 2.5 % RE CREA: TOPICAL_CREAM | Freq: Three times a day (TID) | RECTAL | Status: DC | PRN

## 2014-04-21 MED ORDER — SENNA 8.6 MG PO TABS
1.0000 | ORAL_TABLET | Freq: Two times a day (BID) | ORAL | Status: DC | PRN
  Filled 2014-04-21: qty 1

## 2014-04-21 NOTE — Progress Notes (Signed)
Subjective:  Still SOB.  Minimal urine output  Objective:  Vital Signs in the last 24 hours: BP 130/79  Pulse 74  Temp(Src) 97.6 F (36.4 C) (Oral)  Resp 18  Ht 5\' 10"  (1.778 m)  Wt 112.038 kg (247 lb)  BMI 35.44 kg/m2  SpO2 98%  Physical Exam: Obese WM in NAD Lungs:  Clear  Cardiac:  Regular rhythm, normal S1 and S2, no S3 Abdomen:  Soft, nontender, no masses Extremities:  2+ edema  Intake/Output from previous day: 07/11 0701 - 07/12 0700 In: 120 [P.O.:120] Out: 230 [Urine:230] Weight Filed Weights   04/21/14 0055  Weight: 112.038 kg (247 lb)    Lab Results: Basic Metabolic Panel:  Recent Labs  04/20/14 1655 04/21/14 0725  NA 131* 132*  K 3.9 3.8  CL 83* 84*  CO2 28 27  GLUCOSE 144* 128*  BUN 105* 110*  CREATININE 3.74* 4.32*    CBC:  Recent Labs  04/20/14 1655  WBC 5.9  HGB 10.8*  HCT 34.6*  MCV 86.9  PLT 132*    BNP    Component Value Date/Time   PROBNP 17870.0* 04/20/2014 1655   Telemetry: Sinus rhythm  Assessment/Plan:  1. Acute on chronic diastolic heart failure 2. Stage V chronic kidney disease may be contributing to fluid retention 3. Chronic atrial fibrillation 4. Coronary artery disease with previous bypass grafting  Recommendations:  He is looking like he might need to have dialysis. He has very poor urinary output  present time.   Kerry Hough  MD [facility]  04/21/2014, 12:01 PM

## 2014-04-21 NOTE — Progress Notes (Signed)
Patient ID: [name], male   DOB: 25-Oct-1945, 68 y.o.   MRN: [id number]  [facility] Progress Note    Assessment/ Plan:   1. Acute renal failure on chronic kidney disease stage III: Hemodynamically mediated from CHF decompensation-cardiorenal syndrome. Currently with worsening renal function/diuretic resistance. Discussed with the patient regarding initiation of hemodialysis starting tomorrow after placement of a dialysis catheter-hopefully this will be a temporary measure rather than chronic hemodialysis. Given limited management options, will try acetazolamide and redose furosemide-I doubt there will be any improvement of urine output.  2. Acute exacerbation of congestive heart failure: Appears to be failing diuretic therapy, plan to do hemodialysis tomorrow for ultrafiltration/volume management as well as correction of multiple metabolic abnormalities.  3. Hyponatremia: Appears to be multifactorial from CHF exacerbation, acute renal failure and metolazone. Anticipate to improve with hemodialysis 4. Anemia: Low iron saturation and ferritin, we'll give intravenous iron today.   Subjective:   Reports continued shortness of breath especially when lays flat and having some discomfort from Foley catheter.    Objective:   BP 130/79  Pulse 74  Temp(Src) 97.6 F (36.4 C) (Oral)  Resp 18  Ht 5\' 10"  (1.778 m)  Wt 112.038 kg (247 lb)  BMI 35.44 kg/m2  SpO2 98%  Intake/Output Summary (Last 24 hours) at 04/21/14 1326 Last data filed at 04/21/14 1029  Gross per 24 hour  Intake    360 ml  Output    230 ml  Net    130 ml   Weight change:   Physical Exam: Gen: Appears to be comfortable sitting on the side of his bed, appears fatigued CVS: Pulse regular in rate and rhythm, S1 and S2 normal, no gallop Resp: Fine rales bibasally, no rhonchi/wheeze Abd: Soft, obese, nontender, bowel sounds normal Ext: 1-2+ edema bipedally  Imaging: Dg Chest 2 View  04/20/2014   CLINICAL  DATA:  Bilateral lower extremity swelling for 4 days.  EXAM: CHEST  2 VIEW  COMPARISON:  CT chest 04/18/2014.  PA and lateral chest 04/15/2014.  FINDINGS: There is cardiomegaly without edema. No pneumothorax or pleural fluid. The patient is status post CABG.  IMPRESSION: Cardiomegaly without acute disease.   Electronically Signed   By: Inge Rise M.D.   On: 04/20/2014 17:24   US Renal  04/20/2014   CLINICAL DATA:  Acute renal insufficiency. CKD 3. History of renal cysts.  EXAM: RENAL/URINARY TRACT ULTRASOUND COMPLETE  COMPARISON:  CT abdomen and pelvis 01/27/2014  FINDINGS: Right Kidney:  Length: 12.8 cm. Normal parenchymal echotexture and thickness. No hydronephrosis. Multiple cysts are demonstrated in the midpole of the right kidney, largest measuring about 4.2 cm maximal diameter. Appearance is similar to previous CT scan.  Left Kidney:  Length: 14.2 cm. Normal parenchymal echotexture and thickness. No hydronephrosis. Cysts are demonstrated in the upper and midpole of the left kidney, largest measuring about 3.3 cm maximal diameter. Appearance is similar to previous CT scan.  Bladder:  Bladder is decompressed and cannot be evaluated.  IMPRESSION: Bilateral renal cysts. No hydronephrosis. Bladder is not visualized due to decompression.   Electronically Signed   By: Lucienne Capers M.D.   On: 04/20/2014 22:30    Labs: BMET  Recent Labs Lab 04/20/14 1655 04/21/14 0725  NA 131* 132*  K 3.9 3.8  CL 83* 84*  CO2 28 27  GLUCOSE 144* 128*  BUN 105* 110*  CREATININE 3.74* 4.32*  CALCIUM 8.8 8.4   CBC  Recent Labs Lab 04/20/14 1655  WBC 5.9  HGB 10.8*  HCT 34.6*  MCV 86.9  PLT 132*   Medications:    . aspirin  81 mg Oral Daily  . atorvastatin  20 mg Oral Daily  . carvedilol  6.25 mg Oral BID WC  . FLUoxetine  20 mg Oral Daily  . fluticasone  1 puff Inhalation BID  . furosemide  120 mg Intravenous BID  . heparin  5,000 Units Subcutaneous 3 times per day  . insulin glargine   16 Units Subcutaneous QHS  . lactulose  20 g Oral BID  . levothyroxine  150 mcg Oral QHS  . multivitamin with minerals  1 tablet Oral Daily  . pantoprazole  80 mg Oral Daily  . sodium chloride  3 mL Intravenous Q12H   Elmarie Shiley, MD 04/21/2014, 1:26 PM

## 2014-04-21 NOTE — Progress Notes (Signed)
Report given to receiving RN. Patient in bed sleeping. No signs or symptoms of distress or discomfort.

## 2014-04-21 NOTE — Care Management Note (Addendum)
    Page 1 of 2   04/29/2014     11:28:54 AM CARE MANAGEMENT NOTE 04/29/2014  Patient:  [name]   Account Number:  [id number]  Date Initiated:  04/21/2014  Documentation initiated by:  [facility]  Subjective/Objective Assessment:   adm:  progressive SOB     Action/Plan:   discharge planning   Anticipated DC Date:  04/23/2014   Anticipated DC Plan:  [place]  CM consult      [facility] Choice  HOME HEALTH   Choice offered to / List presented to:  NA        HH arranged  HH-1 RN  [place] OT      [facility] agency  [place] [facility]   Status of service:  In process, will continue to follow Medicare Important Message given?  YES (If response is "NO", the following Medicare IM given date fields will be blank) Date Medicare IM given:  04/23/2014 Medicare IM given by:  HUTCHINSON,CRYSTAL Date Additional Medicare IM given:  04/29/2014 Additional Medicare IM given by:  Drystan Reader  Discharge Disposition:  [place]  Per UR Regulation:  Reviewed for med. necessity/level of care/duration of stay  If discussed at Long Length of Stay Meetings, dates discussed:    Comments:  Crystal Hutchinson RN, BSN, MSHL, CCM  Nurse - Case Manager,  (Unit [place])  [phone] 04/26/2014 CHF and daily HD initiated 7/13 to diurese.  Completed HD qd x 4.  Plan is to start IV Lasix today and through weekend and evaluate for possible need for chronic HD on Monday. Dispostion Plan:  Resume HHS; RN, PT, OT [place] Go / aware of admission)     Crystal Hutchinson RN, BSN, MSHL, CCM  Nurse - Case Manager,  (Unit [place])  [phone] 04/25/2014 PT RECS:  [facility] PT Dispostion Plan:  Resume HHS; RN, PT, OT [place] Go / aware of admission)   04/23/2014  Crystal Hutchinson RN, BSN, MSHL, CCM  Nurse - Case Manager,  (Unit [place])  [phone] PT RECS:  Pending to review LOC needs Dispostion Plan:  Resume HHS; RN, PT, OT  [place] Go / aware of admission)    04/21/14 12:55 CM met with pt and daughter, [name] in room.  RN, Lonzo Cloud,  has done an excellent job screening pt for home health needs: pt and daughter confirm [name]'s findings pt is active with [place] for HHPT/OT/RN and would like to continue with [place] after this hospitalization. CM has requested [place] and has called [facility] to notify of pt admission. Will continue to follow for disposition needs.  [name], BSN, CM [phone].

## 2014-04-21 NOTE — Progress Notes (Signed)
Echo Lab  2D Echocardiogram completed.  Will, [place] 04/21/2014 2:09 PM

## 2014-04-21 NOTE — H&P (Signed)
Patient ID: JOAKIM HUESMAN MRN: [id number], DOB/AGE: [birth year]   Admit date: 04/20/2014   Primary Physician: Jene Every, MD Primary Cardiologist: Dr. Angelena Form  Pt. Profile:  68 yo WM with history of CAD s/p 4V CABG 2001, chronic atrial fibrillation, COPD, OSA, obesity, venous insufficiency and diastolic CHF who presents with volume overload and oliguric renal failure.  Problem List  Past Medical History  Diagnosis Date  . CHRONIC OBSTRUCTIVE PULMONARY DISEASE 06/20/2009  . OBSTRUCTIVE SLEEP APNEA 06/20/2009  . CAROTID STENOSIS 06/20/2009    A. 08/2001 s/p L CEA;  B.   09/14/11 - Carotid U/S - 40-59% bilateral stenosis, left CEA patch angioplasty is patent  . DM 06/20/2009  . CAD 06/20/2009    A.  08/2000 - s/p CABG x 4 - LIMA-LAD, Left Radial-OM, VG-DIAG, VG-RCA;  B. Neg. MV  2010  . HYPERLIPIDEMIA 06/20/2009  . HYPERTENSION 06/20/2009  . Hypothyroidism   . Low back pain   . Asthma     as child  . Pneumonia   . Atrial fibrillation     Not felt to be coumadin candidate 2/2 ETOH use.  Marland Kitchen ETOH abuse   . History of tobacco abuse     remote - quit 1970  . Bilateral renal cysts   . Marijuana abuse   . Morbidly obese   . CHF (congestive heart failure)   . Falls frequently     Past Surgical History  Procedure Laterality Date  . Carotid endarterectomy  2002    left  . Coronary artery bypass graft      x 4 - 2001  . Umbilical hernia repair N/A 01/28/2014    Procedure: HERNIA REPAIR UMBILICAL ADULT/INC;  Surgeon: Harl Bowie, MD;  Location: [place];  Service: General;  Laterality: N/A;  . Laparotomy N/A 01/28/2014    Procedure: EXPLORATORY LAPAROTOMY;  Surgeon: Harl Bowie, MD;  Location: [place];  Service: General;  Laterality: N/A;  . Bowel resection N/A 01/28/2014    Procedure: SMALL BOWEL RESECTION;  Surgeon: Harl Bowie, MD;  Location: [place];  Service: General;  Laterality: N/A;  . Hernia repair       Allergies  Allergies  Allergen Reactions  .  Erythromycin Hives    HPI 68 yo WM with history of CAD s/p 4V CABG 2001, chronic atrial fibrillation, COPD, OSA, obesity, venous insufficiency and diastolic CHF who presents with volume overload and oliguria  Mr. Crist states that "the fluid pills are letting me down." He states that about 4 days ago he noticed decreased UOP (once daily) and fluid accumulation (7lbs since then). He developed progressive SOB that eventually was present even at rest. Denies chest pain, chest pressure, palpitations. Denies dietary indiscretion or missing pills. He also does not that he has had a cough productive of whitish sputum with occasional blood over the past 2 weeks. He was prescribed doxycycline for this. Denies NSAID use.   In the ED, labs were notable for Cr 3.74.BUN 105. K 3.9. BNP 17870 up from 4873 on 01/17/14. CXR clear. Renal was consulted for renal failure. Cardiology was consulted for admission. Foley placed.  Of note, he has previously required intotrope assisted diuresis. He has been considered a poor candidate for systemic anticoagulation due to a history of alcohol abuse.   Home Medications  Prior to Admission medications   Medication Sig Start Date End Date Taking? Authorizing Provider  aspirin 81 MG chewable tablet Chew 1 tablet (81 mg total) by mouth daily.  02/05/14  Yes Kelvin Cellar, MD  atorvastatin (LIPITOR) 20 MG tablet Take 20 mg by mouth daily.   Yes Historical Provider, MD  carvedilol (COREG) 3.125 MG tablet Take 3.125 mg by mouth 2 (two) times daily with a meal. With 6.5 mg bid   Yes Historical Provider, MD  carvedilol (COREG) 6.25 MG tablet Take 6.25 mg by mouth 2 (two) times daily with a meal.   Yes Historical Provider, MD  colchicine 0.6 MG tablet Take 0.6 mg by mouth daily as needed (for gout).   Yes Historical Provider, MD  FLUoxetine (PROZAC) 20 MG capsule Take 20 mg by mouth daily.     Yes Historical Provider, MD  fluticasone (FLOVENT HFA) 110 MCG/ACT inhaler Inhale 1 puff  into the lungs 2 (two) times daily.   Yes Historical Provider, MD  gabapentin (NEURONTIN) 300 MG capsule Take 1 capsule (300 mg total) by mouth daily. 09/07/12  Yes Srikar Janna Arch, MD  glucose blood (BAYER CONTOUR TEST) test strip USE TO TEST BLOOD SUGAR TWICE DAILY 01/23/14  Yes Historical Provider, MD  hydrocortisone (ANUSOL-HC) 2.5 % rectal cream Place rectally 3 (three) times daily as needed for hemorrhoids or itching. 02/05/14  Yes Kelvin Cellar, MD  Insulin Glargine (LANTUS SOLOSTAR) 100 UNIT/ML Solostar Pen Inject 16 Units into the skin at bedtime.    Yes Historical Provider, MD  lactulose (CHRONULAC) 10 GM/15ML solution Take 20 g by mouth 2 (two) times daily.   Yes Historical Provider, MD  levothyroxine (SYNTHROID, LEVOTHROID) 150 MCG tablet Take 150 mcg by mouth at bedtime.    Yes Historical Provider, MD  metolazone (ZAROXOLYN) 2.5 MG tablet Take one tablet by mouth once a week (on Mondays) 02/19/14  Yes Burnell Blanks, MD  Multiple Vitamin (MULTIVITAMIN WITH MINERALS) TABS tablet Take 1 tablet by mouth daily.   Yes Historical Provider, MD  omeprazole (PRILOSEC) 20 MG capsule Take 20 mg by mouth daily.     Yes Historical Provider, MD  Oxycodone HCl 10 MG TABS Take 10 mg by mouth every 8 (eight) hours as needed (for pain).   Yes Historical Provider, MD  senna (SENOKOT) 8.6 MG TABS tablet Take 1 tablet by mouth 2 (two) times daily as needed for mild constipation.   Yes Historical Provider, MD  spironolactone (ALDACTONE) 25 MG tablet Take 1 tablet (25 mg total) by mouth daily. 08/06/13  Yes Jolaine Artist, MD  torsemide (DEMADEX) 20 MG tablet Take 4 tablets (80 mg total) by mouth 2 (two) times daily. 02/19/14  Yes Burnell Blanks, MD    Family History  Family History  Problem Relation Age of Onset  . Stroke Mother     ?  . Stroke Father     ?    Social History  History   Social History  . Marital Status: Single    Spouse Name: N/A    Number of Children: N/A  .  Years of Education: N/A   Occupational History  . retired    Social History Main Topics  . Smoking status: Former Smoker    Quit date: 10/11/1968  . Smokeless tobacco: Not on file  . Alcohol Use: Yes     Comment: wild [place] daily;  also reports regularly drinking a pint of vodka.  . Drug Use: Yes    Special: Marijuana     Comment: last 1 year ago  . Sexual Activity: Not on file   Other Topics Concern  . Not on file   Social History  Narrative   Lives in [place] with dtr, son-in-law     Review of Systems General:  No chills, fever, night sweats or weight changes.  Cardiovascular:  No chest pain,  orthopnea, palpitations, paroxysmal nocturnal dyspnea. Dermatological: No rash, lesions/masses Respiratory: + cough, + dyspnea Urologic: No hematuria, dysuria Abdominal:   No nausea, vomiting, diarrhea, bright red blood per rectum, melena, or hematemesis. + constipation. Neurologic:  No visual changes, wkns, changes in mental status. All other systems reviewed and are otherwise negative except as noted above.  Physical Exam  Blood pressure 138/66, pulse 73, temperature 97.6 F (36.4 C), temperature source Oral, resp. rate 18, height 5\' 10"  (1.778 m), weight 112.038 kg (247 lb), SpO2 98.00%.  General: Pleasant, NAD Psych: Normal affect. Neuro: Alert and oriented X 3. Moves all extremities spontaneously. HEENT: Normal  Neck: Supple without bruits. JVP to ear lobe. Lungs:  Resp regular and unlabored, CTA. Heart: RRR no s3, s4, or murmurs. Abdomen: Soft, non-tender, non-distended, BS + x 4.  Extremities: No clubbing, cyanosis. 1-2+ LE edema. DP/PT/Radials 2+ and equal bilaterally. LLE with some anterior erythema and warmth  Labs  Troponin (Point of Care Test)  Recent Labs  04/20/14 1700  TROPIPOC 0.02   No results found for this basename: CKTOTAL, CKMB, TROPONINI,  in the last 72 hours Lab Results  Component Value Date   WBC 5.9 04/20/2014   HGB 10.8* 04/20/2014   HCT  34.6* 04/20/2014   MCV 86.9 04/20/2014   PLT 132* 04/20/2014    Recent Labs Lab 04/20/14 1655  NA 131*  K 3.9  CL 83*  CO2 28  BUN 105*  CREATININE 3.74*  CALCIUM 8.8  PROT 0.2*  BILITOT <0.2*  ALKPHOS 5*  ALT <5  AST <5  GLUCOSE 144*   Lab Results  Component Value Date   CHOL  Value: 144        ATP III CLASSIFICATION:  <200     mg/dL   Desirable  200-239  mg/dL   Borderline High  >=240    mg/dL   High        06/26/2010   HDL 24* 06/26/2010   LDLCALC  Value: 90        Total Cholesterol/HDL:CHD Risk Coronary Heart Disease Risk Table                     Men   Women  1/2 Average Risk   3.4   3.3  Average Risk       5.0   4.4  2 X Average Risk   9.6   7.1  3 X Average Risk  23.4   11.0        Use the calculated Patient Ratio above and the CHD Risk Table to determine the patient's CHD Risk.        ATP III CLASSIFICATION (LDL):  <100     mg/dL   Optimal  100-129  mg/dL   Near or Above                    Optimal  130-159  mg/dL   Borderline  160-189  mg/dL   High  >190     mg/dL   Very High 06/26/2010   TRIG 150* 06/26/2010   Lab Results  Component Value Date   DDIMER  Value: 0.42        AT THE INHOUSE ESTABLISHED CUTOFF VALUE OF 0.48 ug/mL FEU, THIS ASSAY HAS BEEN DOCUMENTED  IN THE LITERATURE TO HAVE A SENSITIVITY AND NEGATIVE PREDICTIVE VALUE OF AT LEAST 98 TO 99%.  THE TEST RESULT SHOULD BE CORRELATED WITH AN ASSESSMENT OF THE CLINICAL PROBABILITY OF DVT / VTE. 04/05/2010     Radiology/Studies  Dg Chest 2 View  04/20/2014   CLINICAL DATA:  Bilateral lower extremity swelling for 4 days.  EXAM: CHEST  2 VIEW  COMPARISON:  CT chest 04/18/2014.  PA and lateral chest 04/15/2014.  FINDINGS: There is cardiomegaly without edema. No pneumothorax or pleural fluid. The patient is status post CABG.  IMPRESSION: Cardiomegaly without acute disease.   Electronically Signed   By: Inge Rise M.D.   On: 04/20/2014 17:24   US Renal  04/20/2014   CLINICAL DATA:  Acute renal insufficiency. CKD 3. History  of renal cysts.  EXAM: RENAL/URINARY TRACT ULTRASOUND COMPLETE  COMPARISON:  CT abdomen and pelvis 01/27/2014  FINDINGS: Right Kidney:  Length: 12.8 cm. Normal parenchymal echotexture and thickness. No hydronephrosis. Multiple cysts are demonstrated in the midpole of the right kidney, largest measuring about 4.2 cm maximal diameter. Appearance is similar to previous CT scan.  Left Kidney:  Length: 14.2 cm. Normal parenchymal echotexture and thickness. No hydronephrosis. Cysts are demonstrated in the upper and midpole of the left kidney, largest measuring about 3.3 cm maximal diameter. Appearance is similar to previous CT scan.  Bladder:  Bladder is decompressed and cannot be evaluated.  IMPRESSION: Bilateral renal cysts. No hydronephrosis. Bladder is not visualized due to decompression.   Electronically Signed   By: Lucienne Capers M.D.   On: 04/20/2014 22:30    Renal US: Bilateral renal cysts. No hydronephrosis. Bladder is not visualized due to decompression.   ECG AR, iRBBB, NSSTTWC  ASSESSMENT AND PLAN  69 yo WM with history of CAD s/p 4V CABG 2001, chronic atrial fibrillation, COPD, OSA, obesity, venous insufficiency and diastolic CHF who presents with volume overload and oliguric renal failure.  On exam and labs Mr. Yawn appears volume overloaded making a cardiorenal syndrome the most likely cause of his renal failure. However, the story with abrupt onset of oliguria followed by weight gain is more suggestive of an intrinsic renal issue or obstruction, however sediment is bland and renal US is without hydronephrosis. A pulmonary renal syndrome is an interesting consideration recent hemoptysis but would expect a more active urine sediment in that situation.  - 120mg  IV lasix BID - TTE (has been since 2013) - consider renal c/s for hemoptysis - keep an eye on LLE reddness - suspect venous stasis - hold spiro - check iron studies - appreciate renal recs  Signed, Lamar Sprinkles, MD    04/21/2014, 5:26 AM

## 2014-04-21 NOTE — Progress Notes (Signed)
After 0700 patient only had 75cc of urine output. Receiving RN made aware. Cardiology PA paged and made aware. Renal MD paged.

## 2014-04-22 ENCOUNTER — Inpatient Hospital Stay (HOSPITAL_COMMUNITY): Payer: Medicare Other

## 2014-04-22 LAB — BASIC METABOLIC PANEL
ANION GAP: 19 — AB (ref 5–15)
BUN: 115 mg/dL — ABNORMAL HIGH (ref 6–23)
CHLORIDE: 83 meq/L — AB (ref 96–112)
CO2: 28 mEq/L (ref 19–32)
CREATININE: 5.1 mg/dL — AB (ref 0.50–1.35)
Calcium: 8.3 mg/dL — ABNORMAL LOW (ref 8.4–10.5)
GFR calc Af Amer: 12 mL/min — ABNORMAL LOW (ref >90)
GFR calc non Af Amer: 10 mL/min — ABNORMAL LOW (ref >90)
Glucose, Bld: 132 mg/dL — ABNORMAL HIGH (ref 70–99)
POTASSIUM: 3.9 meq/L (ref 3.7–5.3)
Sodium: 130 mEq/L — ABNORMAL LOW (ref 137–147)

## 2014-04-22 LAB — GLUCOSE, CAPILLARY
GLUCOSE-CAPILLARY: 120 mg/dL — AB (ref 70–99)
GLUCOSE-CAPILLARY: 133 mg/dL — AB (ref 70–99)
Glucose-Capillary: 111 mg/dL — ABNORMAL HIGH (ref 70–99)
Glucose-Capillary: 157 mg/dL — ABNORMAL HIGH (ref 70–99)

## 2014-04-22 LAB — HEPATITIS B SURFACE ANTIGEN: Hepatitis B Surface Ag: NEGATIVE

## 2014-04-22 LAB — PROTIME-INR
INR: 1.32 (ref 0.00–1.49)
Prothrombin Time: 16.4 seconds — ABNORMAL HIGH (ref 11.6–15.2)

## 2014-04-22 LAB — HEPATITIS B SURFACE ANTIBODY,QUALITATIVE: Hep B S Ab: NEGATIVE

## 2014-04-22 LAB — HEPATITIS B CORE ANTIBODY, TOTAL: Hep B Core Total Ab: NONREACTIVE

## 2014-04-22 MED ORDER — SODIUM CHLORIDE 0.9 % IV SOLN: 100.0000 mL | INTRAVENOUS | Status: DC | PRN

## 2014-04-22 MED ORDER — HEPARIN SODIUM (PORCINE) 1000 UNIT/ML DIALYSIS: 40.0000 [IU]/kg | INTRAMUSCULAR | Status: DC | PRN

## 2014-04-22 MED ORDER — HEPARIN SODIUM (PORCINE) 1000 UNIT/ML DIALYSIS: 1000.0000 [IU] | INTRAMUSCULAR | Status: DC | PRN

## 2014-04-22 MED ORDER — PENTAFLUOROPROP-TETRAFLUOROETH EX AERO: 1.0000 "application " | INHALATION_SPRAY | CUTANEOUS | Status: DC | PRN

## 2014-04-22 MED ORDER — HEPARIN SODIUM (PORCINE) 1000 UNIT/ML IJ SOLN
INTRAMUSCULAR | Status: AC
  Filled 2014-04-22: qty 1

## 2014-04-22 MED ORDER — LIDOCAINE-PRILOCAINE 2.5-2.5 % EX CREA: 1.0000 "application " | TOPICAL_CREAM | CUTANEOUS | Status: DC | PRN

## 2014-04-22 MED ORDER — NEPRO/CARBSTEADY PO LIQD: 237.0000 mL | ORAL | Status: DC | PRN

## 2014-04-22 MED ORDER — LIDOCAINE HCL (PF) 1 % IJ SOLN: 5.0000 mL | INTRAMUSCULAR | Status: DC | PRN

## 2014-04-22 MED ORDER — ALTEPLASE 2 MG IJ SOLR: 2.0000 mg | Freq: Once | INTRAMUSCULAR | Status: DC | PRN

## 2014-04-22 NOTE — Progress Notes (Signed)
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  Having shortness of breath on 2L O2, abdominal discomfort "from the fluid".  Denies chest pain.  CURRENT MEDS . aspirin  81 mg Oral Daily  . atorvastatin  20 mg Oral Daily  . carvedilol  6.25 mg Oral BID WC  . FLUoxetine  20 mg Oral Daily  . fluticasone  1 puff Inhalation BID  . furosemide  120 mg Intravenous TID  . heparin      . insulin aspart  0-15 Units Subcutaneous TID WC  . insulin glargine  16 Units Subcutaneous QHS  . lactulose  20 g Oral BID  . levothyroxine  150 mcg Oral QHS  . multivitamin with minerals  1 tablet Oral Daily  . pantoprazole  80 mg Oral Daily  . sodium chloride  3 mL Intravenous Q12H    OBJECTIVE  Filed Vitals:   04/21/14 1515 04/21/14 2054 04/22/14 0008 04/22/14 0500  BP: 134/65 105/55 115/55 126/67  Pulse: 62 67 60 67  Temp: 97.4 F (36.3 C) 97.3 F (36.3 C) 97.7 F (36.5 C) 97.6 F (36.4 C)  TempSrc: Oral Oral Oral Oral  Resp: 18 18 16 18   Height:      Weight:    250 lb 12.8 oz (113.762 kg)  SpO2: 97% 95% 98% 99%    Intake/Output Summary (Last 24 hours) at 04/22/14 1027 Last data filed at 04/22/14 0700  Gross per 24 hour  Intake    786 ml  Output    300 ml  Net    486 ml   Filed Weights   04/21/14 0055 04/22/14 0500  Weight: 247 lb (112.038 kg) 250 lb 12.8 oz (113.762 kg)    PHYSICAL EXAM  General: Pt is lethargic, falling asleep during exam.  Obese. Neuro: Alert and oriented X 3. Moves upper extremities spontaneously. Psych: depressed affect HEENT:  Normal  Neck: Supple without bruits. JVD elevated to the earlobe.  Lungs:  Resp regular but with difficulty, no acute respiratory distress. crackles auscultated in the bases. Heart: RRR no s3, s4, or  murmurs. Abdomen: BS + x 4, tympanic, distended and tender diffusely.  Extremities: No clubbing, cyanosis. DP/PT/Radials 1+ and equal bilaterally. 2+ edema in lower extremities bilaterally.  Calves are warm and erythematous w/out a defined boarder.  Accessory Clinical Findings  CBC  Recent Labs  04/20/14 1655  WBC 5.9  HGB 10.8*  HCT 34.6*  MCV 86.9  PLT 850*   Basic Metabolic Panel  Recent Labs  04/21/14 0725 04/22/14 0423  NA 132* 130*  K 3.8 3.9  CL 84* 83*  CO2 27 28  GLUCOSE 128* 132*  BUN 110* 115*  CREATININE 4.32* 5.10*  CALCIUM 8.4 8.3*  MG 1.7  --    Liver Function Tests  Recent Labs  04/20/14 1655  AST <5  ALT <5  ALKPHOS 5*  BILITOT <0.2*  PROT 0.2*  ALBUMIN 0.2*    TELE  Chronic a-fib with CVR  Radiology/Studies  Dg Chest 2 View  04/20/2014   CLINICAL DATA:  Bilateral lower extremity swelling for 4 days.  EXAM: CHEST  2 VIEW  COMPARISON:  CT chest 04/18/2014.  PA and lateral chest 04/15/2014.  FINDINGS: There is cardiomegaly  without edema. No pneumothorax or pleural fluid. The patient is status post CABG.  IMPRESSION: Cardiomegaly without acute disease.   Electronically Signed   By: Inge Rise M.D.   On: 04/20/2014 17:24   US Renal  04/20/2014   CLINICAL DATA:  Acute renal insufficiency. CKD 3. History of renal cysts.  EXAM: RENAL/URINARY TRACT ULTRASOUND COMPLETE  COMPARISON:  CT abdomen and pelvis 01/27/2014  FINDINGS: Right Kidney:  Length: 12.8 cm. Normal parenchymal echotexture and thickness. No hydronephrosis. Multiple cysts are demonstrated in the midpole of the right kidney, largest measuring about 4.2 cm maximal diameter. Appearance is similar to previous CT scan.  Left Kidney:  Length: 14.2 cm. Normal parenchymal echotexture and thickness. No hydronephrosis. Cysts are demonstrated in the upper and midpole of the left kidney, largest measuring about 3.3 cm maximal diameter. Appearance is similar to previous CT scan.  Bladder:   Bladder is decompressed and cannot be evaluated.  IMPRESSION: Bilateral renal cysts. No hydronephrosis. Bladder is not visualized due to decompression.   Electronically Signed   By: Lucienne Capers M.D.   On: 04/20/2014 22:30    ASSESSMENT AND PLAN 68 yo WM with history of CAD s/p 4V CABG 2001, chronic atrial fibrillation, COPD, OSA, obesity, venous insufficiency and diastolic CHF who presents with volume overload and oliguric renal failure.   1. Acute exacerbation of CHF:  failured diuretic therapy.  Hemodialysis today for ultrafiltration/volume management.  Will also help with metabolic abnormalities. BNP 17870.0 (7/111/15).  2. Acute renal failure on chronic kidney disease stage III: nephrology recommending hemodialysis as a temporary measure.  Furosemide 120mg  in dextrose 5% 50 mL TID. Nephrology doubts improvement of urine function with lasix. Cr continues to increase 4.32 yesterday --> 5.10 today.   3. Possible cellulitis:  Patients lower extremities are very edematous with and ill-defined erythema and warmth.  . Afebrile and no increase in WBC, repeat CBC. --Consider treatment with vancomycin or clindamycin  3. Hyponatremia:  130 today. Should improve with hemodialysis.  4. Anemia: Consider IV iron.   Signed, Anson Crofts PA-S  Signed, Perry Mount PA-C  Pager [phone]    Patient seen and examined. Agree with assessment and plan. No chest pain, but shortness of breath.  For hemodialysis/ultrafiltration today.   Troy Sine, MD, [facility] 04/22/2014 12:32 PM

## 2014-04-22 NOTE — Plan of Care (Signed)
Problem: Phase I Progression Outcomes Goal: EF % per last Echo/documented,Core Reminder form on chart Outcome: Completed/Met Date Met:  04/22/14 EF 50-55%(04-21-14)

## 2014-04-22 NOTE — Progress Notes (Signed)
Patient ID: [name], male   DOB: 06-19-1946, 68 y.o.   MRN: [id number]  [facility] Progress Note    Assessment/ Plan:   1. AoCKD3: Hemodynamically mediated from CHF decompensation-cardiorenal syndrome.  Pt s/p nontunneled HD Cath this AM, then for HD today.  Clear vol excess.  Failed diuretics.  Hopefully will improve GFR after ultrafiltration.  2. Acute exacerbation of congestive heart failure: Failed diuresis. For HD as above. 3. Hyponatremia: Ominous in CHF.   4. Anemia: s/p IV Fe.    Subjective:   Ongoing orthopnea and PND.  S/p HD cath this AM.    Objective:   BP 145/74  Pulse 77  Temp(Src) 97.6 F (36.4 C) (Oral)  Resp 18  Ht 5\' 10"  (1.778 m)  Wt 113.762 kg (250 lb 12.8 oz)  BMI 35.99 kg/m2  SpO2 99%  Intake/Output Summary (Last 24 hours) at 04/22/14 1357 Last data filed at 04/22/14 0700  Gross per 24 hour  Intake    546 ml  Output    300 ml  Net    246 ml   Weight change: 1.724 kg (3 lb 12.8 oz)  Physical Exam: Gen: Appears to be comfortable sitting on the side of his bed, appears fatigued CVS: Pulse regular in rate and rhythm, S1 and S2 normal, no gallop Resp: Crackles to the mid lung field. Nl WOB Abd: Soft, obese, nontender, bowel sounds normal Ext: 1-2+ edema bipedally  Imaging: Dg Chest 2 View  04/20/2014   CLINICAL DATA:  Bilateral lower extremity swelling for 4 days.  EXAM: CHEST  2 VIEW  COMPARISON:  CT chest 04/18/2014.  PA and lateral chest 04/15/2014.  FINDINGS: There is cardiomegaly without edema. No pneumothorax or pleural fluid. The patient is status post CABG.  IMPRESSION: Cardiomegaly without acute disease.   Electronically Signed   By: Inge Rise M.D.   On: 04/20/2014 17:24   US Renal  04/20/2014   CLINICAL DATA:  Acute renal insufficiency. CKD 3. History of renal cysts.  EXAM: RENAL/URINARY TRACT ULTRASOUND COMPLETE  COMPARISON:  CT abdomen and pelvis 01/27/2014  FINDINGS: Right Kidney:  Length: 12.8 cm. Normal  parenchymal echotexture and thickness. No hydronephrosis. Multiple cysts are demonstrated in the midpole of the right kidney, largest measuring about 4.2 cm maximal diameter. Appearance is similar to previous CT scan.  Left Kidney:  Length: 14.2 cm. Normal parenchymal echotexture and thickness. No hydronephrosis. Cysts are demonstrated in the upper and midpole of the left kidney, largest measuring about 3.3 cm maximal diameter. Appearance is similar to previous CT scan.  Bladder:  Bladder is decompressed and cannot be evaluated.  IMPRESSION: Bilateral renal cysts. No hydronephrosis. Bladder is not visualized due to decompression.   Electronically Signed   By: Lucienne Capers M.D.   On: 04/20/2014 22:30    Labs: BMET  Recent Labs Lab 04/20/14 1655 04/21/14 0725 04/22/14 0423  NA 131* 132* 130*  K 3.9 3.8 3.9  CL 83* 84* 83*  CO2 28 27 28   GLUCOSE 144* 128* 132*  BUN 105* 110* 115*  CREATININE 3.74* 4.32* 5.10*  CALCIUM 8.8 8.4 8.3*   CBC  Recent Labs Lab 04/20/14 1655  WBC 5.9  HGB 10.8*  HCT 34.6*  MCV 86.9  PLT 132*   Medications:    . aspirin  81 mg Oral Daily  . atorvastatin  20 mg Oral Daily  . carvedilol  6.25 mg Oral BID WC  . FLUoxetine  20 mg Oral Daily  .  fluticasone  1 puff Inhalation BID  . furosemide  120 mg Intravenous TID  . heparin      . insulin aspart  0-15 Units Subcutaneous TID WC  . insulin glargine  16 Units Subcutaneous QHS  . lactulose  20 g Oral BID  . levothyroxine  150 mcg Oral QHS  . multivitamin with minerals  1 tablet Oral Daily  . pantoprazole  80 mg Oral Daily  . sodium chloride  3 mL Intravenous Q12H   Pearson Grippe, MD 04/22/2014, 1:57 PM

## 2014-04-22 NOTE — Progress Notes (Signed)
Report given to receiving RN. Patient in bed resting. No verbal complaints and no signs or symptoms of distress or discomfort noted.  

## 2014-04-22 NOTE — Progress Notes (Signed)
Pt has been having pink tinged urine, notified MD, MD d/c Heparin, pt has SCD for VTE, will continue to monitor, Thanks Arvella Nigh RN

## 2014-04-22 NOTE — Procedures (Signed)
R IJ Trialysis HD cath  placement with [place] and fluoroscopy No complication No blood loss. See complete dictation in [facility]

## 2014-04-22 NOTE — H&P (Signed)
[name] is an 68 y.o. male.   Chief Complaint: Pt with known CHF Cardiorenal syndrome- diuretic resistant Worsening renal function DM Request made for temporary dialysis catheter placement per [facility] for recovery  HPI: COPD; OSA; CAD; DM; HLD; HTN; CHF; Afib; etoh abuse; obese  Past Medical History  Diagnosis Date  . CHRONIC OBSTRUCTIVE PULMONARY DISEASE 06/20/2009  . OBSTRUCTIVE SLEEP APNEA 06/20/2009  . CAROTID STENOSIS 06/20/2009    A. 08/2001 s/p L CEA;  B.   09/14/11 - Carotid U/S - 40-59% bilateral stenosis, left CEA patch angioplasty is patent  . DM 06/20/2009  . CAD 06/20/2009    A.  08/2000 - s/p CABG x 4 - LIMA-LAD, Left Radial-OM, VG-DIAG, VG-RCA;  B. Neg. MV  2010  . HYPERLIPIDEMIA 06/20/2009  . HYPERTENSION 06/20/2009  . Hypothyroidism   . Low back pain   . Asthma     as child  . Pneumonia   . Atrial fibrillation     Not felt to be coumadin candidate 2/2 ETOH use.  Marland Kitchen ETOH abuse   . History of tobacco abuse     remote - quit 1970  . Bilateral renal cysts   . Marijuana abuse   . Morbidly obese   . CHF (congestive heart failure)   . Falls frequently     Past Surgical History  Procedure Laterality Date  . Carotid endarterectomy  2002    left  . Coronary artery bypass graft      x 4 - 2001  . Umbilical hernia repair N/A 01/28/2014    Procedure: HERNIA REPAIR UMBILICAL ADULT/INC;  Surgeon: Harl Bowie, MD;  Location: [place];  Service: General;  Laterality: N/A;  . Laparotomy N/A 01/28/2014    Procedure: EXPLORATORY LAPAROTOMY;  Surgeon: Harl Bowie, MD;  Location: [place];  Service: General;  Laterality: N/A;  . Bowel resection N/A 01/28/2014    Procedure: SMALL BOWEL RESECTION;  Surgeon: Harl Bowie, MD;  Location: [place];  Service: General;  Laterality: N/A;  . Hernia repair      Family History  Problem Relation Age of Onset  . Stroke Mother     ?  . Stroke Father     ?   Social History:  reports that he quit smoking about 45 years  ago. He does not have any smokeless tobacco history on file. He reports that he drinks alcohol. He reports that he uses illicit drugs (Marijuana).  Allergies:  Allergies  Allergen Reactions  . Erythromycin Hives    Medications Prior to Admission  Medication Sig Dispense Refill  . aspirin 81 MG chewable tablet Chew 1 tablet (81 mg total) by mouth daily.  30 tablet  1  . atorvastatin (LIPITOR) 20 MG tablet Take 20 mg by mouth daily.      . carvedilol (COREG) 3.125 MG tablet Take 3.125 mg by mouth 2 (two) times daily with a meal. With 6.5 mg bid      . carvedilol (COREG) 6.25 MG tablet Take 6.25 mg by mouth 2 (two) times daily with a meal.      . colchicine 0.6 MG tablet Take 0.6 mg by mouth daily as needed (for gout).      Marland Kitchen FLUoxetine (PROZAC) 20 MG capsule Take 20 mg by mouth daily.        . fluticasone (FLOVENT HFA) 110 MCG/ACT inhaler Inhale 1 puff into the lungs 2 (two) times daily.      Marland Kitchen gabapentin (NEURONTIN) 300 MG  capsule Take 1 capsule (300 mg total) by mouth daily.      . glucose blood (BAYER CONTOUR TEST) test strip USE TO TEST BLOOD SUGAR TWICE DAILY      . hydrocortisone (ANUSOL-HC) 2.5 % rectal cream Place rectally 3 (three) times daily as needed for hemorrhoids or itching.  30 g  0  . Insulin Glargine (LANTUS SOLOSTAR) 100 UNIT/ML Solostar Pen Inject 16 Units into the skin at bedtime.       . lactulose (CHRONULAC) 10 GM/15ML solution Take 20 g by mouth 2 (two) times daily.      . levothyroxine (SYNTHROID, LEVOTHROID) 150 MCG tablet Take 150 mcg by mouth at bedtime.       . metolazone (ZAROXOLYN) 2.5 MG tablet Take one tablet by mouth once a week (on Mondays)  4 tablet  3  . Multiple Vitamin (MULTIVITAMIN WITH MINERALS) TABS tablet Take 1 tablet by mouth daily.      . omeprazole (PRILOSEC) 20 MG capsule Take 20 mg by mouth daily.        . Oxycodone HCl 10 MG TABS Take 10 mg by mouth every 8 (eight) hours as needed (for pain).      . senna (SENOKOT) 8.6 MG TABS tablet Take 1  tablet by mouth 2 (two) times daily as needed for mild constipation.      . spironolactone (ALDACTONE) 25 MG tablet Take 1 tablet (25 mg total) by mouth daily.  30 tablet  6  . torsemide (DEMADEX) 20 MG tablet Take 4 tablets (80 mg total) by mouth 2 (two) times daily.  240 tablet  3    Results for orders placed during the hospital encounter of 04/20/14 (from the past 48 hour(s))  CBC     Status: Abnormal   Collection Time    04/20/14  4:55 PM      Result Value Ref Range   WBC 5.9  4.0 - 10.5 K/uL   RBC 3.98 (*) 4.22 - 5.81 MIL/uL   Hemoglobin 10.8 (*) 13.0 - 17.0 g/dL   HCT 34.6 (*) 39.0 - 52.0 %   MCV 86.9  78.0 - 100.0 fL   MCH 27.1  26.0 - 34.0 pg   MCHC 31.2  30.0 - 36.0 g/dL   RDW 16.0 (*) 11.5 - 15.5 %   Platelets 132 (*) 150 - 400 K/uL  BASIC METABOLIC PANEL     Status: Abnormal   Collection Time    04/20/14  4:55 PM      Result Value Ref Range   Sodium 131 (*) 137 - 147 mEq/L   Potassium 3.9  3.7 - 5.3 mEq/L   Chloride 83 (*) 96 - 112 mEq/L   CO2 28  19 - 32 mEq/L   Glucose, Bld 144 (*) 70 - 99 mg/dL   BUN 105 (*) 6 - 23 mg/dL   Creatinine, Ser 3.74 (*) 0.50 - 1.35 mg/dL   Calcium 8.8  8.4 - 10.5 mg/dL   GFR calc non Af Amer 15 (*) >90 mL/min   GFR calc Af Amer 17 (*) >90 mL/min   Comment: (NOTE)     The eGFR has been calculated using the CKD EPI equation.     This calculation has not been validated in all clinical situations.     eGFR's persistently <90 mL/min signify possible Chronic Kidney     Disease.   Anion gap 20 (*) 5 - 15  PRO B NATRIURETIC PEPTIDE     Status: Abnormal     Collection Time    04/20/14  4:55 PM      Result Value Ref Range   Pro B Natriuretic peptide (BNP) 17870.0 (*) 0 - 125 pg/mL  HEPATIC FUNCTION PANEL     Status: Abnormal   Collection Time    04/20/14  4:55 PM      Result Value Ref Range   Total Protein 0.2 (*) 6.0 - 8.3 g/dL   Albumin 0.2 (*) 3.5 - 5.2 g/dL   AST <5  0 - 37 U/L   ALT <5  0 - 53 U/L   Alkaline Phosphatase 5 (*) 39 -  117 U/L   Total Bilirubin <0.2 (*) 0.3 - 1.2 mg/dL   Bilirubin, Direct <0.2  0.0 - 0.3 mg/dL   Indirect Bilirubin NOT CALCULATED  0.3 - 0.9 mg/dL  I-STAT TROPOININ, ED     Status: None   Collection Time    04/20/14  5:00 PM      Result Value Ref Range   Troponin i, poc 0.02  0.00 - 0.08 ng/mL   Comment 3            Comment: Due to the release kinetics of cTnI,     a negative result within the first hours     of the onset of symptoms does not rule out     myocardial infarction with certainty.     If myocardial infarction is still suspected,     repeat the test at appropriate intervals.  FERRITIN     Status: Abnormal   Collection Time    04/20/14  9:37 PM      Result Value Ref Range   Ferritin 17 (*) 22 - 322 ng/mL   Comment: Performed at [facility]  IRON AND TIBC     Status: Abnormal   Collection Time    04/20/14  9:37 PM      Result Value Ref Range   Iron 29 (*) 42 - 135 ug/dL   TIBC 352  215 - 435 ug/dL   Saturation Ratios 8 (*) 20 - 55 %   UIBC 323  125 - 400 ug/dL   Comment: Performed at [facility]  CREATININE, URINE, RANDOM     Status: None   Collection Time    04/20/14  9:40 PM      Result Value Ref Range   Creatinine, Urine 70.68    UREA NITROGEN, URINE     Status: None   Collection Time    04/20/14  9:40 PM      Result Value Ref Range   Urea Nitrogen, Ur 242     Comment: (NOTE)       No established reference range for random urine.     Performed at [facility]  SODIUM, URINE, RANDOM     Status: None   Collection Time    04/20/14  9:40 PM      Result Value Ref Range   Sodium, Ur 22    URINALYSIS, ROUTINE W REFLEX MICROSCOPIC     Status: Abnormal   Collection Time    04/20/14  9:40 PM      Result Value Ref Range   Color, Urine AMBER (*) YELLOW   Comment: BIOCHEMICALS MAY BE AFFECTED BY COLOR   APPearance CLOUDY (*) CLEAR   Specific Gravity, Urine 1.020  1.005 - 1.030   pH 5.0  5.0 - 8.0   Glucose, UA NEGATIVE  NEGATIVE mg/dL    Hgb urine dipstick   MODERATE (*) NEGATIVE   Bilirubin Urine NEGATIVE  NEGATIVE   Ketones, ur 15 (*) NEGATIVE mg/dL   Protein, ur 30 (*) NEGATIVE mg/dL   Urobilinogen, UA 1.0  0.0 - 1.0 mg/dL   Nitrite NEGATIVE  NEGATIVE   Leukocytes, UA NEGATIVE  NEGATIVE  URINE MICROSCOPIC-ADD ON     Status: Abnormal   Collection Time    04/20/14  9:40 PM      Result Value Ref Range   Squamous Epithelial / LPF RARE  RARE   RBC / HPF 3-6  <3 RBC/hpf   Bacteria, UA FEW (*) RARE   Urine-Other AMORPHOUS URATES/PHOSPHATES     Comment: MUCOUS PRESENT  GLUCOSE, CAPILLARY     Status: Abnormal   Collection Time    04/21/14  1:07 AM      Result Value Ref Range   Glucose-Capillary 116 (*) 70 - 99 mg/dL  GLUCOSE, CAPILLARY     Status: Abnormal   Collection Time    04/21/14  6:17 AM      Result Value Ref Range   Glucose-Capillary 143 (*) 70 - 99 mg/dL   Comment 1 Notify RN     Comment 2 Documented in Chart    BASIC METABOLIC PANEL     Status: Abnormal   Collection Time    04/21/14  7:25 AM      Result Value Ref Range   Sodium 132 (*) 137 - 147 mEq/L   Potassium 3.8  3.7 - 5.3 mEq/L   Chloride 84 (*) 96 - 112 mEq/L   CO2 27  19 - 32 mEq/L   Glucose, Bld 128 (*) 70 - 99 mg/dL   BUN 110 (*) 6 - 23 mg/dL   Creatinine, Ser 4.32 (*) 0.50 - 1.35 mg/dL   Calcium 8.4  8.4 - 10.5 mg/dL   GFR calc non Af Amer 13 (*) >90 mL/min   GFR calc Af Amer 15 (*) >90 mL/min   Comment: (NOTE)     The eGFR has been calculated using the CKD EPI equation.     This calculation has not been validated in all clinical situations.     eGFR's persistently <90 mL/min signify possible Chronic Kidney     Disease.   Anion gap 21 (*) 5 - 15  MAGNESIUM     Status: None   Collection Time    04/21/14  7:25 AM      Result Value Ref Range   Magnesium 1.7  1.5 - 2.5 mg/dL  GLUCOSE, CAPILLARY     Status: Abnormal   Collection Time    04/21/14 11:10 AM      Result Value Ref Range   Glucose-Capillary 172 (*) 70 - 99 mg/dL    Comment 1 Notify RN    HEPATITIS B SURFACE ANTIBODY     Status: None   Collection Time    04/21/14  2:43 PM      Result Value Ref Range   Hep B S Ab NEGATIVE  NEGATIVE   Comment: Performed at [facility]  HEPATITIS B SURFACE ANTIGEN     Status: None   Collection Time    04/21/14  2:43 PM      Result Value Ref Range   Hepatitis B Surface Ag NEGATIVE  NEGATIVE   Comment: Performed at [facility]  GLUCOSE, CAPILLARY     Status: Abnormal   Collection Time    04/21/14  4:11 PM      Result Value Ref Range     Glucose-Capillary 169 (*) 70 - 99 mg/dL   Comment 1 Notify RN    GLUCOSE, CAPILLARY     Status: Abnormal   Collection Time    04/21/14  9:02 PM      Result Value Ref Range   Glucose-Capillary 142 (*) 70 - 99 mg/dL  BASIC METABOLIC PANEL     Status: Abnormal   Collection Time    04/22/14  4:23 AM      Result Value Ref Range   Sodium 130 (*) 137 - 147 mEq/L   Potassium 3.9  3.7 - 5.3 mEq/L   Chloride 83 (*) 96 - 112 mEq/L   CO2 28  19 - 32 mEq/L   Glucose, Bld 132 (*) 70 - 99 mg/dL   BUN 115 (*) 6 - 23 mg/dL   Creatinine, Ser 5.10 (*) 0.50 - 1.35 mg/dL   Calcium 8.3 (*) 8.4 - 10.5 mg/dL   GFR calc non Af Amer 10 (*) >90 mL/min   GFR calc Af Amer 12 (*) >90 mL/min   Comment: (NOTE)     The eGFR has been calculated using the CKD EPI equation.     This calculation has not been validated in all clinical situations.     eGFR's persistently <90 mL/min signify possible Chronic Kidney     Disease.   Anion gap 19 (*) 5 - 15  GLUCOSE, CAPILLARY     Status: Abnormal   Collection Time    04/22/14  5:52 AM      Result Value Ref Range   Glucose-Capillary 120 (*) 70 - 99 mg/dL   Comment 1 Notify RN     Comment 2 Documented in Chart     Dg Chest 2 View  04/20/2014   CLINICAL DATA:  Bilateral lower extremity swelling for 4 days.  EXAM: CHEST  2 VIEW  COMPARISON:  CT chest 04/18/2014.  PA and lateral chest 04/15/2014.  FINDINGS: There is cardiomegaly without edema. No  pneumothorax or pleural fluid. The patient is status post CABG.  IMPRESSION: Cardiomegaly without acute disease.   Electronically Signed   By: Thomas  Dalessio M.D.   On: 04/20/2014 17:24   Us Renal  04/20/2014   CLINICAL DATA:  Acute renal insufficiency. CKD 3. History of renal cysts.  EXAM: RENAL/URINARY TRACT ULTRASOUND COMPLETE  COMPARISON:  CT abdomen and pelvis 01/27/2014  FINDINGS: Right Kidney:  Length: 12.8 cm. Normal parenchymal echotexture and thickness. No hydronephrosis. Multiple cysts are demonstrated in the midpole of the right kidney, largest measuring about 4.2 cm maximal diameter. Appearance is similar to previous CT scan.  Left Kidney:  Length: 14.2 cm. Normal parenchymal echotexture and thickness. No hydronephrosis. Cysts are demonstrated in the upper and midpole of the left kidney, largest measuring about 3.3 cm maximal diameter. Appearance is similar to previous CT scan.  Bladder:  Bladder is decompressed and cannot be evaluated.  IMPRESSION: Bilateral renal cysts. No hydronephrosis. Bladder is not visualized due to decompression.   Electronically Signed   By: William  Stevens M.D.   On: 04/20/2014 22:30    Review of Systems  Constitutional: Negative for fever and weight loss.  Respiratory: Negative for shortness of breath.   Cardiovascular: Negative for chest pain.  Gastrointestinal: Negative for nausea, vomiting and abdominal pain.  Neurological: Positive for weakness.    Blood pressure 126/67, pulse 67, temperature 97.6 F (36.4 C), temperature source Oral, resp. rate 18, height 5' 10" (1.778 m), weight 113.762 kg (250 lb 12.8 oz), SpO2 99.00%. Physical   Exam  Constitutional: He is oriented to person, place, and time. He appears well-developed.  Cardiovascular: Normal rate.   No murmur heard. Respiratory: Effort normal. He has wheezes.  GI: Soft. Bowel sounds are normal. There is no tenderness.  Musculoskeletal: Normal range of motion.  Neurological: He is alert and  oriented to person, place, and time.  Skin: Skin is warm.  Psychiatric: He has a normal mood and affect. His behavior is normal. Judgment and thought content normal.     Assessment/Plan CHF Worsening renal fxn Cardiorenal syndrome Need for temp cath - to start dialysis asap Pt aware of procedure beenfits and risks and agreeable to proceed Consent signed and in chart   , A 04/22/2014, 7:48 AM    

## 2014-04-23 LAB — BASIC METABOLIC PANEL
Anion gap: 17 — ABNORMAL HIGH (ref 5–15)
BUN: 84 mg/dL — ABNORMAL HIGH (ref 6–23)
CO2: 27 meq/L (ref 19–32)
Calcium: 8.5 mg/dL (ref 8.4–10.5)
Chloride: 88 mEq/L — ABNORMAL LOW (ref 96–112)
Creatinine, Ser: 4.56 mg/dL — ABNORMAL HIGH (ref 0.50–1.35)
GFR calc Af Amer: 14 mL/min — ABNORMAL LOW (ref >90)
GFR, EST NON AFRICAN AMERICAN: 12 mL/min — AB (ref >90)
Glucose, Bld: 132 mg/dL — ABNORMAL HIGH (ref 70–99)
Potassium: 3.5 mEq/L — ABNORMAL LOW (ref 3.7–5.3)
SODIUM: 132 meq/L — AB (ref 137–147)

## 2014-04-23 LAB — URINE MICROSCOPIC-ADD ON

## 2014-04-23 LAB — CBC
HEMATOCRIT: 32.4 % — AB (ref 39.0–52.0)
Hemoglobin: 10.3 g/dL — ABNORMAL LOW (ref 13.0–17.0)
MCH: 27.7 pg (ref 26.0–34.0)
MCHC: 31.8 g/dL (ref 30.0–36.0)
MCV: 87.1 fL (ref 78.0–100.0)
Platelets: 116 10*3/uL — ABNORMAL LOW (ref 150–400)
RBC: 3.72 MIL/uL — ABNORMAL LOW (ref 4.22–5.81)
RDW: 15.7 % — ABNORMAL HIGH (ref 11.5–15.5)
WBC: 5.6 10*3/uL (ref 4.0–10.5)

## 2014-04-23 LAB — URINALYSIS, ROUTINE W REFLEX MICROSCOPIC
BILIRUBIN URINE: NEGATIVE
Glucose, UA: NEGATIVE mg/dL
Ketones, ur: NEGATIVE mg/dL
NITRITE: NEGATIVE
PH: 5 (ref 5.0–8.0)
Protein, ur: 100 mg/dL — AB
Specific Gravity, Urine: 1.015 (ref 1.005–1.030)
Urobilinogen, UA: 1 mg/dL (ref 0.0–1.0)

## 2014-04-23 LAB — GLUCOSE, CAPILLARY
GLUCOSE-CAPILLARY: 116 mg/dL — AB (ref 70–99)
GLUCOSE-CAPILLARY: 122 mg/dL — AB (ref 70–99)
GLUCOSE-CAPILLARY: 127 mg/dL — AB (ref 70–99)
GLUCOSE-CAPILLARY: 131 mg/dL — AB (ref 70–99)

## 2014-04-23 MED ORDER — HEPARIN SODIUM (PORCINE) 1000 UNIT/ML DIALYSIS
20.0000 [IU]/kg | INTRAMUSCULAR | Status: DC | PRN
Start: 2014-04-23 — End: 2014-04-24
  Administered 2014-04-24: 2300 [IU] via INTRAVENOUS_CENTRAL
  Filled 2014-04-23: qty 3

## 2014-04-23 MED ORDER — ACETAMINOPHEN 325 MG PO TABS
ORAL_TABLET | ORAL | Status: AC
  Administered 2014-04-23: 650 mg via ORAL
  Filled 2014-04-23: qty 2

## 2014-04-23 NOTE — ED Provider Notes (Signed)
Medical screening examination/treatment/procedure(s) were conducted as a shared visit with non-physician practitioner(s) and myself.  I personally evaluated the patient during the encounter.  Worsening SOB, leg swelling, weight gain x 1 week.  NO chest pain. Bibasilar crackles with JVD to ears and +3 pitting edema. CHF exacerbation with apparent cardiorenal syndrome, ARF, poor urine output. Consult renal and cards.   EKG Interpretation   Date/Time:  Saturday April 20 2014 16:28:25 EDT Ventricular Rate:  61 PR Interval:    QRS Duration: 92 QT Interval:  448 QTC Calculation: 450 R Axis:   147 Text Interpretation:  Atrial fibrillation Incomplete right bundle branch  block Right ventricular hypertrophy Nonspecific ST and T wave abnormality  , probably digitalis effect Abnormal ECG No significant change was found  Confirmed by Wyvonnia Dusky  MD, Jaimie Redditt [phone]) on 04/20/2014 5:39:35 PM       Ezequiel Essex, MD 04/23/14 1755

## 2014-04-23 NOTE — Progress Notes (Signed)
Report given to receiving RN. Patient not in room-at dialysis.

## 2014-04-23 NOTE — Progress Notes (Signed)
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  He is sitting up in bed and asking to walk.  His shortness of breath is much improved although he is still requiring oxygen and has chronic orthopnea.  He feels that the swelling in his abdomen and extremities is much better.  Pt is complaining of a right leg cramp in his buttocks.  He states that this started yesterday during dialysis and that is feels better with walking.  He also notes burning when he urinates.  Denies chest pain, abdominal pain, headache, lightheadedness, dizziness.  CURRENT MEDS . aspirin  81 mg Oral Daily  . atorvastatin  20 mg Oral Daily  . carvedilol  6.25 mg Oral BID WC  . FLUoxetine  20 mg Oral Daily  . fluticasone  1 puff Inhalation BID  . furosemide  120 mg Intravenous TID  . insulin aspart  0-15 Units Subcutaneous TID WC  . insulin glargine  16 Units Subcutaneous QHS  . lactulose  20 g Oral BID  . levothyroxine  150 mcg Oral QHS  . multivitamin with minerals  1 tablet Oral Daily  . pantoprazole  80 mg Oral Daily  . sodium chloride  3 mL Intravenous Q12H    OBJECTIVE  Filed Vitals:   04/22/14 1917 04/22/14 1938 04/22/14 2107 04/23/14 0603  BP: 115/53 112/56  116/52  Pulse: 76 76  68  Temp: 97.5 F (36.4 C) 97.8 F (36.6 C)  97.3 F (36.3 C)  TempSrc: Oral Oral  Oral  Resp: 20 18  18   Height:      Weight: 253 lb 15.5 oz (115.2 kg)   250 lb 14.1 oz (113.8 kg)  SpO2: 97% 97% 98% 98%    Intake/Output Summary (Last 24 hours) at 04/23/14 0808 Last data filed at 04/23/14 0500  Gross per 24 hour  Intake    240 ml  Output   2675 ml  Net  -2435 ml   Filed Weights   04/22/14 1643 04/22/14 1917 04/23/14 0603  Weight: 258 lb 9.6 oz (117.3 kg) 253 lb 15.5 oz (115.2 kg) 250 lb 14.1 oz  (113.8 kg)    PHYSICAL EXAM  General: Much improved from yesterday.  Sitting at the edge of the bed, talkative and very pleasant. Neuro: Alert and oriented X 3. Moves all extremities spontaneously. Psych: Normal affect. HEENT:  Normal  Neck: Supple without bruits or JVD elevated Lungs:  Resp regular and unlabored, Quiet breat sounds with diffused crackles. Heart: RRR no s3, s4, or murmurs. Abdomen: normal active bowel sounds, tymapnic, distended, mild diffuse tenderness Extremities: No clubbing, cyanosis.  2+ edema bilaterally in LE's. Radials 2+ and equal bilaterally. Unable to palpated pedal pulses due to edema.  Pts right hallux has a 0.5cm blackened lesion, the bottom of the toe is also speckled with black lesions.  Left foot with no lesions but hallux and pad of foot are purple with 5 second capillary refill.  Feet are warm.  Erythema is decreased in calves from yesterday.   Accessory Clinical Findings  CBC  Recent Labs  04/20/14 1655  WBC 5.9  HGB 10.8*  HCT 34.6*  MCV 86.9  PLT 536*   Basic Metabolic Panel  Recent Labs  04/21/14 0725 04/22/14  0423 04/23/14 0422  NA 132* 130* 132*  K 3.8 3.9 3.5*  CL 84* 83* 88*  CO2 27 28 27   GLUCOSE 128* 132* 132*  BUN 110* 115* 84*  CREATININE 4.32* 5.10* 4.56*  CALCIUM 8.4 8.3* 8.5  MG 1.7  --   --    Liver Function Tests  Recent Labs  04/20/14 1655  AST <5  ALT <5  ALKPHOS 5*  BILITOT <0.2*  PROT 0.2*  ALBUMIN 0.2*    TELE  Chronic a-fib with CVR yesterday--- tele not working when saw patient   Radiology/Studies  Dg Chest 2 View  04/20/2014   CLINICAL DATA:  Bilateral lower extremity swelling for 4 days.  EXAM: CHEST  2 VIEW  COMPARISON:  CT chest 04/18/2014.  PA and lateral chest 04/15/2014.  FINDINGS: There is cardiomegaly without edema. No pneumothorax or pleural fluid. The patient is status post CABG.  IMPRESSION: Cardiomegaly without acute disease.   Electronically Signed   By: Inge Rise M.D.    On: 04/20/2014 17:24   US Renal  04/20/2014   CLINICAL DATA:  Acute renal insufficiency. CKD 3. History of renal cysts.  EXAM: RENAL/URINARY TRACT ULTRASOUND COMPLETE  COMPARISON:  CT abdomen and pelvis 01/27/2014  FINDINGS: Right Kidney:  Length: 12.8 cm. Normal parenchymal echotexture and thickness. No hydronephrosis. Multiple cysts are demonstrated in the midpole of the right kidney, largest measuring about 4.2 cm maximal diameter. Appearance is similar to previous CT scan.  Left Kidney:  Length: 14.2 cm. Normal parenchymal echotexture and thickness. No hydronephrosis. Cysts are demonstrated in the upper and midpole of the left kidney, largest measuring about 3.3 cm maximal diameter. Appearance is similar to previous CT scan.  Bladder:  Bladder is decompressed and cannot be evaluated.  IMPRESSION: Bilateral renal cysts. No hydronephrosis. Bladder is not visualized due to decompression.   Electronically Signed   By: Lucienne Capers M.D.   On: 04/20/2014 22:30   Ir Fluoro Guide Cv Line Right  04/22/2014   CLINICAL DATA:  Renal insufficiency, needs access for hemodialysis  TECHNIQUE: RIGHT IJ CATHETER PLACEMENT UNDER ULTRASOUND AND FLUOROSCOPIC GUIDANCE  The procedure, risks (including but not limited to bleeding, infection, organ damage, pneumothorax), benefits, and alternatives were explained to the patient. Questions regarding the procedure were encouraged and answered. The patient understands and consents to the procedure. Patency of the right IJ vein was confirmed with ultrasound with image documentation. An appropriate skin site was determined. Skin site was marked. Region was prepped using maximum barrier technique including cap and mask, sterile gown, sterile gloves, large sterile sheet, and Chlorhexidine as cutaneous antisepsis. The region was infiltrated locally with 1% lidocaine. Under real-time ultrasound guidance, the right IJ vein was accessed with a 19 gauge needle; the needle tip within the  vein was confirmed with ultrasound image documentation. The needle exchanged over a guidewire for vascular dilator which allowed advancement of a 20 cm Trialysis catheter. This was positioned with the tip at the cavoatrial junction. Spot chest radiograph shows good positioning and no pneumothorax. Catheter was flushed and sutured externally with 0-Prolene sutures. Patient tolerated the procedure well, with no immediate complication.  IMPRESSION: 1. Technically successful right IJ Trialysis catheter placement.   Electronically Signed   By: Arne Cleveland M.D.   On: 04/22/2014 14:08    ASSESSMENT AND PLAN  68 yo WM with history of CAD s/p 4V CABG 2001, chronic atrial fibrillation, COPD, OSA, obesity, venous insufficiency and diastolic CHF who presents with volume overload and  oliguric renal failure.   Pt looks significantly improved from yesterday and is making clear, yellow urine.  Dysuria: pt has foley catheter. Ordered UA for possible UTI.   Hypotension: pt was hypotensive at 90/45 last night.  BPs remain soft but stable  Acute exacerbation of CHF: failured diuretic therapy. Hemodialysis yesterday for ultrafiltration/volume management.  Metabolic abnormalities slowly improving. Potassium was low at 3.5. BUN/Cr improving (115/5.10 --> 84/4.56).  BNP 17870.0 (04/20/14). -- Given one dose of 120mg  IV Lasix this AM then discontinued per nephrology. Patient states he is getting HD again today. I do not see note from nephrology.   Acute renal failure on chronic kidney disease stage III: hemodialysis yesterday per nephrology. Urine is now yellow and clear in the foley bag.  Net output -2449mL. Cr improved from yesterday 5.10 --> 4.56.   Constipation: pt has not had a bowel movement in 2 days. Lactulose 20g BID ordered.  Pt has been refusing this medication.    Hyponatremia: 132 today. Should improve with continued hemodialysis.   Anemia: s/p IV iron  Right toe wounds (1st and 2nd MT)- He stubbed  his toes prior to admission. Looks like a scabbed over hematoma with poor healing due to DM. Does not look infected to me. Patient cannot feel due to neuropathy. One open sore with no drainage. Does not appear infected to me. Covered in bandage. Continue to monitor.   Signed, Anson Crofts PA-S  Signed, Perry Mount PA-C  Pager [phone]    Patient seen and examined. Agree with assessment and plan. Breathing better today. For additional dialysis later today. No chest pain.Cr improved from 5.1 yesterday to 4.56 today; 3.5 kg weight loss.    Troy Sine, MD, [facility] 04/23/2014 11:19 AM

## 2014-04-23 NOTE — Progress Notes (Signed)
PT Cancellation Note  Patient Details Name: [name] MRN: [id number] DOB: 1945/11/28   Cancelled Treatment:    Reason Eval/Treat Not Completed: Patient at procedure--Hemodialysis. 04/23/2014  Donnella Sham, [place] [phone]  (pager)   [name], Tessie Fass 04/23/2014, 5:19 PM

## 2014-04-23 NOTE — Plan of Care (Signed)
Problem: Phase I Progression Outcomes Goal: EF % per last Echo/documented,Core Reminder form on chart Outcome: Completed/Met Date Met:  04/23/14 EF=50-55%

## 2014-04-23 NOTE — Progress Notes (Signed)
Admit: 04/20/2014 LOS: 3  36M admith A/C CHF, failed diuresis, AoCKD (SCr BL 1.4-1.6) now req RRT (first 7/13 via nontunneled HD Cath)  Subjective:  HD yesterday, 2L removed.     07/13 0701 - 07/14 0700 In: 240 [P.O.:240] Out: 2675 [Urine:675]  Filed Weights   04/22/14 1643 04/22/14 1917 04/23/14 0603  Weight: 117.3 kg (258 lb 9.6 oz) 115.2 kg (253 lb 15.5 oz) 113.8 kg (250 lb 14.1 oz)    Current meds: reviewed  Current Labs: reviewed   Physical Exam:  Blood pressure 106/49, pulse 69, temperature 97.3 F (36.3 C), temperature source Oral, resp. rate 18, height 5\' 10"  (1.778 m), weight 113.8 kg (250 lb 14.1 oz), SpO2 96.00%. Chronically ill appearing, obese RRR Crackles to mid lung fields 3-4+ LEE b/l, pitting, chronic venous stasis changes EOMI Nonfocal  Assessment 1. AoCKD (BL SCr 1.4-1.6): diuretic refractory. HD #1 7/13, for #2 today with 3L UF goal.  Perhaps once decompressed he can be liberated from RRT.  2. A/C CHF w/ Vol O/L: as per #1 and Cardiology.  Hold lasix today and will follow for daily HD needs for vol removal.  3. Hyponatremia: ominous, should correct with further HD; limit free water intake 4. Anemia: Hb >10, s/p IV Feraheme 1020 on 7/12 5. CAD s/p CABG, PAD 6. OSA 7. Hx/o AFib 8. HTN  Pearson Grippe MD 04/23/2014, 1:20 PM   Recent Labs Lab 04/21/14 0725 04/22/14 0423 04/23/14 0422  NA 132* 130* 132*  K 3.8 3.9 3.5*  CL 84* 83* 88*  CO2 27 28 27   GLUCOSE 128* 132* 132*  BUN 110* 115* 84*  CREATININE 4.32* 5.10* 4.56*  CALCIUM 8.4 8.3* 8.5    Recent Labs Lab 04/20/14 1655 04/23/14 1058  WBC 5.9 5.6  HGB 10.8* 10.3*  HCT 34.6* 32.4*  MCV 86.9 87.1  PLT 132* 116*

## 2014-04-24 DIAGNOSIS — E871 Hypo-osmolality and hyponatremia: Secondary | ICD-10-CM

## 2014-04-24 LAB — GLUCOSE, CAPILLARY
GLUCOSE-CAPILLARY: 115 mg/dL — AB (ref 70–99)
GLUCOSE-CAPILLARY: 85 mg/dL (ref 70–99)
Glucose-Capillary: 129 mg/dL — ABNORMAL HIGH (ref 70–99)

## 2014-04-24 LAB — CBC
HCT: 31.7 % — ABNORMAL LOW (ref 39.0–52.0)
Hemoglobin: 9.7 g/dL — ABNORMAL LOW (ref 13.0–17.0)
MCH: 26.9 pg (ref 26.0–34.0)
MCHC: 30.6 g/dL (ref 30.0–36.0)
MCV: 87.8 fL (ref 78.0–100.0)
PLATELETS: 121 10*3/uL — AB (ref 150–400)
RBC: 3.61 MIL/uL — AB (ref 4.22–5.81)
RDW: 15.8 % — ABNORMAL HIGH (ref 11.5–15.5)
WBC: 6.4 10*3/uL (ref 4.0–10.5)

## 2014-04-24 NOTE — Progress Notes (Signed)
Patient Profile: 68 yo WM with history of CAD s/p 4V CABG 2001, chronic atrial fibrillation, COPD, OSA, obesity, venous insufficiency and diastolic CHF who presented with acute CHF and oliguric renal failure.    Subjective: Breathing has improved. Denies chest pain.   Objective: Vital signs in last 24 hours: Temp:  [97.1 F (36.2 C)-97.8 F (36.6 C)] 97.8 F (36.6 C) (07/15 0539) Pulse Rate:  [53-78] 62 (07/15 0539) Resp:  [13-19] 18 (07/15 0539) BP: (98-156)/(49-82) 100/58 mmHg (07/15 0539) SpO2:  [96 %-100 %] 100 % (07/15 0539) Weight:  [248 lb 12.3 oz (112.84 kg)-255 lb 15.3 oz (116.1 kg)] 248 lb 12.3 oz (112.84 kg) (07/15 0539) Last BM Date: 04/21/14  Intake/Output from previous day: 07/14 0701 - 07/15 0700 In: 760 [P.O.:760] Out: 3761 [Urine:805; Stool:1] Intake/Output this shift:    Medications Current Facility-Administered Medications  Medication Dose Route Frequency Provider Last Rate Last Dose  . 0.9 %  sodium chloride infusion  250 mL Intravenous PRN Lamar Sprinkles, MD      . acetaminophen (TYLENOL) tablet 650 mg  650 mg Oral Q4H PRN Lamar Sprinkles, MD   650 mg at 04/24/14 0452  . aspirin chewable tablet 81 mg  81 mg Oral Daily Lamar Sprinkles, MD   81 mg at 04/23/14 0488  . atorvastatin (LIPITOR) tablet 20 mg  20 mg Oral Daily Lamar Sprinkles, MD   20 mg at 04/23/14 0936  . carvedilol (COREG) tablet 6.25 mg  6.25 mg Oral BID WC Lamar Sprinkles, MD   6.25 mg at 04/23/14 2010  . FLUoxetine (PROZAC) capsule 20 mg  20 mg Oral Daily Lamar Sprinkles, MD   20 mg at 04/23/14 0936  . fluticasone (FLOVENT HFA) 110 MCG/ACT inhaler 1 puff  1 puff Inhalation BID Lamar Sprinkles, MD   1 puff at 04/23/14 0810  . heparin injection 2,300 Units  20 Units/kg Dialysis PRN Rexene Agent, MD      . hydrocortisone (ANUSOL-HC) 2.5 % rectal cream   Rectal TID PRN Lamar Sprinkles, MD      . insulin aspart (novoLOG) injection 0-15 Units  0-15 Units Subcutaneous TID [facility] Jacolyn Reedy, MD    2 Units at 04/24/14 [id number]  . insulin glargine (LANTUS) injection 16 Units  16 Units Subcutaneous QHS Lamar Sprinkles, MD   16 Units at 04/23/14 2153  . lactulose (CHRONULAC) 10 GM/15ML solution 20 g  20 g Oral BID Lamar Sprinkles, MD   20 g at 04/23/14 0939  . levothyroxine (SYNTHROID, LEVOTHROID) tablet 150 mcg  150 mcg Oral QHS Lamar Sprinkles, MD   150 mcg at 04/23/14 2153  . multivitamin with minerals tablet 1 tablet  1 tablet Oral Daily Lamar Sprinkles, MD   1 tablet at 04/23/14 0936  . ondansetron (ZOFRAN) injection 4 mg  4 mg Intravenous Q6H PRN Lamar Sprinkles, MD   4 mg at 04/22/14 2323  . oxyCODONE (Oxy IR/ROXICODONE) immediate release tablet 10 mg  10 mg Oral Q8H PRN Lamar Sprinkles, MD   10 mg at 04/22/14 2236  . pantoprazole (PROTONIX) EC tablet 80 mg  80 mg Oral Daily Lamar Sprinkles, MD   80 mg at 04/23/14 9450  . senna (SENOKOT) tablet 8.6 mg  1 tablet Oral BID PRN Lamar Sprinkles, MD      . sodium chloride 0.9 % injection 3 mL  3 mL Intravenous Q12H Lamar Sprinkles, MD   3 mL at 04/23/14 2153  . sodium chloride 0.9 % injection 3 mL  3 mL  Intravenous PRN Lamar Sprinkles, MD        PE: General appearance: alert, cooperative and no distress Neck: no JVD Lungs: faint bibasilar crackles Heart: regular rate and rhythm Extremities: 1+ tense bilateral LEE Pulses: 2+ and symmetric Skin: warm and dry Neurologic: Grossly normal  Lab Results:   Recent Labs  04/23/14 1058 04/24/14 0533  WBC 5.6 6.4  HGB 10.3* 9.7*  HCT 32.4* 31.7*  PLT 116* 121*   BMET  Recent Labs  04/22/14 0423 04/23/14 0422  NA 130* 132*  K 3.9 3.5*  CL 83* 88*  CO2 28 27  GLUCOSE 132* 132*  BUN 115* 84*  CREATININE 5.10* 4.56*  CALCIUM 8.3* 8.5   PT/INR  Recent Labs  04/22/14 0731  LABPROT 16.4*  INR 1.32    Assessment/Plan  Active Problems:   Acute on chronic systolic and diastolic heart failure, NYHA class 4   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  Acute on Chronic CHF: breathing has improved with fluid removal from HD. - 3L in past 24 hrs and -5L total since admission.  Continue with volume removal per Nephrology. Continue BB. No ACE/ARB given CKD.  2. A/C CKD: Nephrology following. Continue to hold lasix and will continue with daily HD, per nephrology, for vol removal.   3. Anemia: slight Hgb decrease from 10.3-->9.7. Continue to monitor. ? IV Fe per nephrology.     LOS: 4 days    Brittainy M. Ladoris Gene 04/24/2014 8:54 AM   Patient seen and examined. Agree with assessment and plan. Breathing better. For repeat dialysis treatment 3 today with goal for 4 L UF per renal. Will free water restrict with continued hyponatremia.   Troy Sine, MD, [facility] 04/24/2014 10:41 AM

## 2014-04-24 NOTE — Progress Notes (Signed)
Report given to receiving RN. Patient sitting on the side of the bed. No complaints and no signs or symptoms of distress or discomfort noted.

## 2014-04-24 NOTE — Procedures (Signed)
I was present at this dialysis session. I have reviewed the session itself and made appropriate changes.   Goal UF 4L. Tolerating so far.   Pearson Grippe  MD 04/24/2014, 3:02 PM

## 2014-04-24 NOTE — Progress Notes (Signed)
Admit: 04/20/2014 LOS: 4  72M admith A/C CHF, failed diuresis, AoCKD (SCr BL 1.4-1.6) now req RRT (first 7/13 via nontunneled HD Cath)  Subjective:  HD yesterday, 3L removed Pt w/ good PO, states breathing is better but can't give more info Still with profound LEE Foley removed overnight Weight trending down  07/14 0701 - 07/15 0700 In: 760 [P.O.:760] Out: 3761 [Urine:805; Stool:1]  Filed Weights   04/23/14 1645 04/23/14 1950 04/24/14 0539  Weight: 116.1 kg (255 lb 15.3 oz) 113.2 kg (249 lb 9 oz) 112.84 kg (248 lb 12.3 oz)    Current meds: reviewed  Current Labs: reviewed   Physical Exam:  Blood pressure 100/58, pulse 62, temperature 97.8 F (36.6 C), temperature source Oral, resp. rate 18, height 5\' 10"  (1.778 m), weight 112.84 kg (248 lb 12.3 oz), SpO2 100.00%. Chronically ill appearing, obese RRR Crackles to basilar lung fields; lessened 3-4+ LEE b/l, pitting, chronic venous stasis changes EOMI Nonfocal  Assessment 1. AoCKD (BL SCr 1.4-1.6): diuretic refractory. HD started 04/22/14.  For Tx #3 today with goal 4L UF.  Will cont daily HD for vol removal. Follow UOP for inc and if need Foley for accurate I/O will replace.   2. A/C CHF w/ Vol O/L: as per #1 and Cardiology.  Hold lasix and will follow for daily HD needs for vol removal. Will need to reintroduce lasix in future to see if can maintain volume status and GFR.  3. Hyponatremia: ominous, should correct with further HD; limit free water intake 4. Anemia: Stable, s/p IV Feraheme 1020 on 7/12 5. CAD s/p CABG, PAD 6. OSA 7. Hx/o AFib 8. HTN  Pearson Grippe MD 04/24/2014, 9:30 AM   Recent Labs Lab 04/21/14 0725 04/22/14 0423 04/23/14 0422  NA 132* 130* 132*  K 3.8 3.9 3.5*  CL 84* 83* 88*  CO2 27 28 27   GLUCOSE 128* 132* 132*  BUN 110* 115* 84*  CREATININE 4.32* 5.10* 4.56*  CALCIUM 8.4 8.3* 8.5    Recent Labs Lab 04/20/14 1655 04/23/14 1058 04/24/14 0533  WBC 5.9 5.6 6.4  HGB 10.8* 10.3* 9.7*  HCT  34.6* 32.4* 31.7*  MCV 86.9 87.1 87.8  PLT 132* 116* 121*

## 2014-04-24 NOTE — Progress Notes (Addendum)
Patient arrived back from dialysis last night.  Ate half of is supper tray and complained of a headache.  Patient had trouble sleeping at first due to stated "neuropathy pain in legs" and feeling of burning with urine.  Will remove urinary catheter as patient is not being aggressively diuresed.  Patient is stable and currently sleeping.  Per protocol of removing urinary catheters, I removed the catheter as [name] is no longer being aggressively diuresed.  Patient had also been complaining of the catheter burning as well as needing to stand up to pee.  Patients UA was developing more ominously.  Patient is able to stand up and use the walker to walk, so patient should be able to stand up and urinate as needed.  Nurse will continue to monitor.

## 2014-04-24 NOTE — Evaluation (Addendum)
Physical Therapy Evaluation Patient Details Name: [name] MRN: [id number] DOB: 1945/12/26 Today's Date: 04/24/2014   History of Present Illness  Pt is a 68 y/o male admitted s/p acute on chronic systolic and diastolic heart failure, NYHA class 4.   Clinical Impression  Pt admitted with the above. Pt currently with functional limitations due to the deficits listed below (see PT Problem List). At the time of PT eval pt demonstrated the ability to perform transfers and ambulation with supervision and occasional min guard. Feel he is safe to return home at d/c however unclear how much assist is available for ADL's. May benefit from an OT evaluation. Pt will benefit from skilled PT to increase their independence and safety with mobility to allow discharge to the venue listed below.       Follow Up Recommendations Home health PT;Supervision for mobility/OOB    Equipment Recommendations  None recommended by PT    Recommendations for Other Services       Precautions / Restrictions Precautions Precautions: Fall Restrictions Weight Bearing Restrictions: No      Mobility  Bed Mobility Overal bed mobility: Needs Assistance Bed Mobility: Supine to Sit     Supine to sit: Supervision;HOB elevated     General bed mobility comments: Pt used bed rails and required increased time to achieve full sitting position on EOB. Supervision for safety.   Transfers Overall transfer level: Needs assistance Equipment used: Rolling walker (2 wheeled) Transfers: Sit to/from Stand Sit to Stand: Supervision         General transfer comment: VC's for hand placement on seated surface for safety, as pt trying to pull up on walker. No physical assist required.   Ambulation/Gait Ambulation/Gait assistance: Supervision;Min guard Ambulation Distance (Feet): 25 Feet Assistive device: Rolling walker (2 wheeled) Gait Pattern/deviations: Decreased stride length;Shuffle;Trunk flexed Gait velocity:  Decreased Gait velocity interpretation: Below normal speed for age/gender General Gait Details: Pt wanting to ambulate in room only. Supervision required, however min guard provided during turns for safety. Pt states "I'm not going to fall, don't worry."  Stairs            Wheelchair Mobility    Modified Rankin (Stroke Patients Only)       Balance Overall balance assessment: Needs assistance Sitting-balance support: Feet supported;No upper extremity supported Sitting balance-Leahy Scale: Good     Standing balance support: Single extremity supported Standing balance-Leahy Scale: Fair                               Pertinent Vitals/Pain Vitals stable throughout session.     Home Living Family/patient expects to be discharged to:: Private residence Living Arrangements: Children Available Help at Discharge: Family Type of Home: House Home Access: Stairs to enter Entrance Stairs-Rails: Can reach both Entrance Stairs-Number of Steps: 3 Home Layout: One level Home Equipment: Walker - 2 wheels;Grab bars - toilet;Grab bars - tub/shower      Prior Function Level of Independence: Independent with assistive device(s)         Comments: Used the walker all the time. States he was bathing and dressing independently. Daughter was assisting to get in/out of the shower until he got grab bars for the bathroom.      Hand Dominance   Dominant Hand: Right    Extremity/Trunk Assessment   Upper Extremity Assessment: Defer to OT evaluation           Lower Extremity Assessment:  Generalized weakness      Cervical / Trunk Assessment: Kyphotic  Communication   Communication: No difficulties  Cognition Arousal/Alertness: Lethargic Behavior During Therapy: WFL for tasks assessed/performed Overall Cognitive Status: Within Functional Limits for tasks assessed                      General Comments General comments (skin integrity, edema, etc.): Pt  unable to hold urinal while standing. Asking for assist for placement due to abdominal girth.     Exercises        Assessment/Plan    PT Assessment Patient needs continued PT services  PT Diagnosis Difficulty walking;Generalized weakness   PT Problem List Decreased strength;Decreased range of motion;Decreased activity tolerance;Decreased balance;Decreased mobility;Decreased knowledge of use of DME;Decreased safety awareness;Decreased knowledge of precautions;Obesity  PT Treatment Interventions DME instruction;Gait training;Stair training;Functional mobility training;Therapeutic activities;Therapeutic exercise;Neuromuscular re-education;Patient/family education   PT Goals (Current goals can be found in the Care Plan section) Acute Rehab PT Goals Patient Stated Goal: None stated PT Goal Formulation: With patient Time For Goal Achievement: 05/01/14 Potential to Achieve Goals: Good    Frequency Min 3X/week   Barriers to discharge Decreased caregiver support Unclear how much assist pt will have at home for ADL's. Pt had difficulty using the urinal during session due to size.     Co-evaluation               End of Session Equipment Utilized During Treatment: Gait belt;Oxygen Activity Tolerance: Patient tolerated treatment well Patient left: in chair;with chair alarm set;with call bell/phone within reach Nurse Communication: Mobility status         Time: 0141-0301 PT Time Calculation (min): 31 min   Charges:   PT Evaluation $Initial PT Evaluation Tier I: 1 Procedure PT Treatments $Therapeutic Activity: 23-37 mins   PT G Codes:          Jolyn Lent 04/24/2014, 11:07 AM  Jolyn Lent, PT, DPT Acute Rehabilitation Services Pager: [phone]

## 2014-04-25 DIAGNOSIS — E1029 Type 1 diabetes mellitus with other diabetic kidney complication: Secondary | ICD-10-CM

## 2014-04-25 DIAGNOSIS — I798 Other disorders of arteries, arterioles and capillaries in diseases classified elsewhere: Secondary | ICD-10-CM

## 2014-04-25 DIAGNOSIS — E1159 Type 2 diabetes mellitus with other circulatory complications: Secondary | ICD-10-CM

## 2014-04-25 DIAGNOSIS — N185 Chronic kidney disease, stage 5: Secondary | ICD-10-CM

## 2014-04-25 LAB — GLUCOSE, CAPILLARY
GLUCOSE-CAPILLARY: 112 mg/dL — AB (ref 70–99)
Glucose-Capillary: 182 mg/dL — ABNORMAL HIGH (ref 70–99)
Glucose-Capillary: 122 mg/dL — ABNORMAL HIGH (ref 70–99)
Glucose-Capillary: 103 mg/dL — ABNORMAL HIGH (ref 70–99)
Glucose-Capillary: 130 mg/dL — ABNORMAL HIGH (ref 70–99)

## 2014-04-25 LAB — CBC
HCT: 35.1 % — ABNORMAL LOW (ref 39.0–52.0)
Hemoglobin: 10.7 g/dL — ABNORMAL LOW (ref 13.0–17.0)
MCH: 27.4 pg (ref 26.0–34.0)
MCHC: 30.5 g/dL (ref 30.0–36.0)
MCV: 89.8 fL (ref 78.0–100.0)
Platelets: 119 10*3/uL — ABNORMAL LOW (ref 150–400)
RBC: 3.91 MIL/uL — ABNORMAL LOW (ref 4.22–5.81)
RDW: 15.8 % — ABNORMAL HIGH (ref 11.5–15.5)
WBC: 5.8 10*3/uL (ref 4.0–10.5)

## 2014-04-25 LAB — BASIC METABOLIC PANEL
Anion gap: 12 (ref 5–15)
BUN: 31 mg/dL — AB (ref 6–23)
CHLORIDE: 97 meq/L (ref 96–112)
CO2: 30 mEq/L (ref 19–32)
Calcium: 9 mg/dL (ref 8.4–10.5)
Creatinine, Ser: 2.42 mg/dL — ABNORMAL HIGH (ref 0.50–1.35)
GFR calc non Af Amer: 25 mL/min — ABNORMAL LOW (ref >90)
GFR, EST AFRICAN AMERICAN: 30 mL/min — AB (ref >90)
Glucose, Bld: 128 mg/dL — ABNORMAL HIGH (ref 70–99)
POTASSIUM: 4.3 meq/L (ref 3.7–5.3)
Sodium: 139 mEq/L (ref 137–147)

## 2014-04-25 MED ORDER — HEPARIN SODIUM (PORCINE) 1000 UNIT/ML DIALYSIS
20.0000 [IU]/kg | INTRAMUSCULAR | Status: DC | PRN
  Administered 2014-04-25: 2200 [IU] via INTRAVENOUS_CENTRAL
  Filled 2014-04-25: qty 3

## 2014-04-25 MED ORDER — ACETAMINOPHEN 325 MG PO TABS
ORAL_TABLET | ORAL | Status: AC
  Administered 2014-04-25: 650 mg via ORAL
  Filled 2014-04-25: qty 2

## 2014-04-25 MED ORDER — OXYCODONE HCL 5 MG PO TABS
ORAL_TABLET | ORAL | Status: AC
  Filled 2014-04-25: qty 2

## 2014-04-25 MED ORDER — HEPARIN SODIUM (PORCINE) 1000 UNIT/ML DIALYSIS
20.0000 [IU]/kg | INTRAMUSCULAR | Status: DC | PRN
  Filled 2014-04-25: qty 3

## 2014-04-25 NOTE — Procedures (Signed)
I was present at this dialysis session. I have reviewed the session itself and made appropriate changes.   Goal UF 4L, tolerating.  After today will reintroduce diuretics tomorrow into weekend to see if can stabilize or needs long term treatment.  Pearson Grippe  MD 04/25/2014, 1:05 PM

## 2014-04-25 NOTE — Progress Notes (Signed)
UR completed Reshard Guillet K. Kynzli Rease, RN, BSN, [place], CCM  04/25/2014 3:22 PM

## 2014-04-25 NOTE — Progress Notes (Addendum)
Patient Profile: 68 yo WM with history of CAD s/p 4V CABG 2001, chronic atrial fibrillation, COPD, OSA, obesity, venous insufficiency and diastolic CHF who presented with acute CHF and oliguric renal failure.    Subjective: Breathing has improved. Denies chest pain. Dysuria better. He has burning in LEs from peripheral neuropathy. He requests that he be able to walk with nurses as this helps him the most.   Objective: Vital signs in last 24 hours: Temp:  [97.6 F (36.4 C)-98.2 F (36.8 C)] 97.8 F (36.6 C) (07/16 0537) Pulse Rate:  [57-80] 65 (07/16 0537) Resp:  [16-20] 17 (07/16 0537) BP: (100-154)/(46-82) 121/59 mmHg (07/16 0537) SpO2:  [97 %-100 %] 97 % (07/16 0537) Weight:  [236 lb 15.9 oz (107.5 kg)-255 lb 11.7 oz (116 kg)] 236 lb 15.9 oz (107.5 kg) (07/16 0537) Last BM Date: 04/24/14  Intake/Output from previous day: 07/15 0701 - 07/16 0700 In: 480 [P.O.:480] Out: 3942 [Urine:575] Intake/Output this shift:    Medications Current Facility-Administered Medications  Medication Dose Route Frequency Provider Last Rate Last Dose  . 0.9 %  sodium chloride infusion  250 mL Intravenous PRN Lamar Sprinkles, MD      . acetaminophen (TYLENOL) tablet 650 mg  650 mg Oral Q4H PRN Lamar Sprinkles, MD   650 mg at 04/24/14 1752  . aspirin chewable tablet 81 mg  81 mg Oral Daily Lamar Sprinkles, MD   81 mg at 04/24/14 1048  . atorvastatin (LIPITOR) tablet 20 mg  20 mg Oral Daily Lamar Sprinkles, MD   20 mg at 04/24/14 1048  . carvedilol (COREG) tablet 6.25 mg  6.25 mg Oral BID WC Lamar Sprinkles, MD   6.25 mg at 04/25/14 7096  . FLUoxetine (PROZAC) capsule 20 mg  20 mg Oral Daily Lamar Sprinkles, MD   20 mg at 04/24/14 1048  . fluticasone (FLOVENT HFA) 110 MCG/ACT inhaler 1 puff  1 puff Inhalation BID Lamar Sprinkles, MD   1 puff at 04/24/14 2029  . hydrocortisone (ANUSOL-HC) 2.5 % rectal cream   Rectal TID PRN Lamar Sprinkles, MD      . insulin aspart (novoLOG) injection 0-15 Units  0-15  Units Subcutaneous TID WC Jacolyn Reedy, MD   2 Units at 04/24/14 [phone]  . insulin glargine (LANTUS) injection 16 Units  16 Units Subcutaneous QHS Lamar Sprinkles, MD   16 Units at 04/24/14 2156  . lactulose (CHRONULAC) 10 GM/15ML solution 20 g  20 g Oral BID Lamar Sprinkles, MD   20 g at 04/23/14 0939  . levothyroxine (SYNTHROID, LEVOTHROID) tablet 150 mcg  150 mcg Oral QHS Lamar Sprinkles, MD   150 mcg at 04/24/14 2154  . multivitamin with minerals tablet 1 tablet  1 tablet Oral Daily Lamar Sprinkles, MD   1 tablet at 04/24/14 1048  . ondansetron (ZOFRAN) injection 4 mg  4 mg Intravenous Q6H PRN Lamar Sprinkles, MD   4 mg at 04/22/14 2323  . oxyCODONE (Oxy IR/ROXICODONE) immediate release tablet 10 mg  10 mg Oral Q8H PRN Lamar Sprinkles, MD   10 mg at 04/25/14 6294  . pantoprazole (PROTONIX) EC tablet 80 mg  80 mg Oral Daily Lamar Sprinkles, MD   80 mg at 04/24/14 1048  . senna (SENOKOT) tablet 8.6 mg  1 tablet Oral BID PRN Lamar Sprinkles, MD      . sodium chloride 0.9 % injection 3 mL  3 mL Intravenous Q12H Lamar Sprinkles, MD   3 mL at 04/24/14 2200  . sodium chloride 0.9 %  injection 3 mL  3 mL Intravenous PRN Lamar Sprinkles, MD        PE: General appearance: alert, cooperative and no distress Neck: no JVD Lungs: faint bibasilar crackles Heart: regular rate and rhythm Extremities: 1+ tense bilateral LEE. 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    Acute on Chronic CHF: breathing has improved with fluid removal from HD. -8.2L total since admission.  -- Continue with volume removal per Nephrology. Continue BB. No ACE/ARB given CKD.  A/C CKD: (BL SCr 1.4-1.6): diuretic refractory.  -- HD started 04/22/14. For Tx #4 today. Peak creat 5.1--> 2.42 today.  -- Follow UOP for inc and if need Foley for accurate I/O will replace.  -- Nephrology following. Continue to hold lasix and will continue with daily HD.  Hyponatremia- resolved with free water restriction. Continue to monitor   Anemia: Hgb 10.7. Continue to monitor.  Peripheral neuropathy- patient complains of LE pain. States that walking is most helpful. I have asked his nurse to walk with him in the halls   LOS: 5 days   Perry Mount , PA-C 04/25/2014 8:31 AM   Patient seen and examined. Agree with assessment and plan. Breathing better. I/O -[id number]. For dialysis #4 today. Cr improved today to 2.42. Improving LE edema but still present. Hyponatremia improving; Na today 139.   Troy Sine, MD, [facility] 04/25/2014 9:57 AM

## 2014-04-26 LAB — BASIC METABOLIC PANEL
Anion gap: 13 (ref 5–15)
BUN: 21 mg/dL (ref 6–23)
CO2: 29 meq/L (ref 19–32)
CREATININE: 1.97 mg/dL — AB (ref 0.50–1.35)
Calcium: 9.6 mg/dL (ref 8.4–10.5)
Chloride: 93 mEq/L — ABNORMAL LOW (ref 96–112)
GFR calc Af Amer: 38 mL/min — ABNORMAL LOW (ref >90)
GFR calc non Af Amer: 33 mL/min — ABNORMAL LOW (ref >90)
GLUCOSE: 93 mg/dL (ref 70–99)
Potassium: 3.6 mEq/L — ABNORMAL LOW (ref 3.7–5.3)
Sodium: 135 mEq/L — ABNORMAL LOW (ref 137–147)

## 2014-04-26 LAB — CBC
HEMATOCRIT: 38.8 % — AB (ref 39.0–52.0)
HEMOGLOBIN: 11.8 g/dL — AB (ref 13.0–17.0)
MCH: 27.3 pg (ref 26.0–34.0)
MCHC: 30.4 g/dL (ref 30.0–36.0)
MCV: 89.8 fL (ref 78.0–100.0)
Platelets: 111 10*3/uL — ABNORMAL LOW (ref 150–400)
RBC: 4.32 MIL/uL (ref 4.22–5.81)
RDW: 15.8 % — ABNORMAL HIGH (ref 11.5–15.5)
WBC: 6.3 10*3/uL (ref 4.0–10.5)

## 2014-04-26 LAB — GLUCOSE, CAPILLARY
GLUCOSE-CAPILLARY: 200 mg/dL — AB (ref 70–99)
GLUCOSE-CAPILLARY: 128 mg/dL — AB (ref 70–99)
Glucose-Capillary: 92 mg/dL (ref 70–99)
Glucose-Capillary: 142 mg/dL — ABNORMAL HIGH (ref 70–99)

## 2014-04-26 MED ORDER — FUROSEMIDE 10 MG/ML IJ SOLN
160.0000 mg | Freq: Three times a day (TID) | INTRAMUSCULAR | Status: DC
  Administered 2014-04-26 – 2014-04-28 (×7): 160 mg via INTRAVENOUS
  Filled 2014-04-26 (×10): qty 16

## 2014-04-26 NOTE — Progress Notes (Signed)
Physical Therapy Treatment Patient Details Name: [name] MRN: [id number] DOB: 05/04/1946 Today's Date: 04/26/2014    History of Present Illness Pt is a 68 y/o male admitted s/p acute on chronic systolic and diastolic heart failure, NYHA class 4.     PT Comments    Pt progressing towards physical therapy goals. Was able to improve gait distance, however very distracted when family member was present at end of session. Unable to focus on therapeutic exercise and session was ended. Will continue to follow and progress as able per POC.   Follow Up Recommendations  Home health PT;Supervision for mobility/OOB     Equipment Recommendations  None recommended by PT    Recommendations for Other Services       Precautions / Restrictions Precautions Precautions: Fall Restrictions Weight Bearing Restrictions: No    Mobility  Bed Mobility               General bed mobility comments: Pt received standing EOB, stating he was going for a walk.  Transfers Overall transfer level: Needs assistance Equipment used: Rolling walker (2 wheeled) Transfers: Sit to/from Stand Sit to Stand: Supervision         General transfer comment: VC's for hand placement on seated surface for safety, as pt trying to pull up on walker. No physical assist required.   Ambulation/Gait Ambulation/Gait assistance: Supervision Ambulation Distance (Feet): 175 Feet Assistive device: Rolling walker (2 wheeled) Gait Pattern/deviations: Step-through pattern;Decreased stride length;Trunk flexed Gait velocity: Decreased Gait velocity interpretation: Below normal speed for age/gender General Gait Details: Pt motivated for distance this session. Was able to ambulate without assist, however close supervision required and VC's for improved posture with walker.    Stairs            Wheelchair Mobility    Modified Rankin (Stroke Patients Only)       Balance Overall balance assessment: Needs  assistance Sitting-balance support: Feet supported;No upper extremity supported Sitting balance-Leahy Scale: Good     Standing balance support: Bilateral upper extremity supported Standing balance-Leahy Scale: Fair                      Cognition Arousal/Alertness: Awake/alert Behavior During Therapy: WFL for tasks assessed/performed Overall Cognitive Status: Impaired/Different from baseline Area of Impairment: Safety/judgement         Safety/Judgement: Decreased awareness of safety;Decreased awareness of deficits     General Comments: Multiple reminders throughout session to have a staff member assist him for mobility, as he was planning to walk alone later this afternoon.     Exercises      General Comments        Pertinent Vitals/Pain Vitals stable throughout session.     Home Living                      Prior Function            PT Goals (current goals can now be found in the care plan section) Acute Rehab PT Goals Patient Stated Goal: None stated PT Goal Formulation: With patient Time For Goal Achievement: 05/01/14 Potential to Achieve Goals: Good Progress towards PT goals: Progressing toward goals    Frequency  Min 3X/week    PT Plan Current plan remains appropriate    Co-evaluation             End of Session Equipment Utilized During Treatment: Gait belt Activity Tolerance: Patient tolerated treatment well Patient left: in  chair;with chair alarm set;with call bell/phone within reach;with family/visitor present     Time: 7106-2694 PT Time Calculation (min): 20 min  Charges:  $Gait Training: 8-22 mins                    G Codes:      Jolyn Lent 2014-04-28, 12:30 PM  Jolyn Lent, PT, DPT Acute Rehabilitation Services Pager: [phone]

## 2014-04-26 NOTE — Progress Notes (Signed)
Patient slept for about 4 hours.  Complained of leg pain throughout the whole night.  Gave PRN tylenol and oxycodone.  Patient also ambulated up and down the hallway about 100 feet.  His oxygen saturation was 96% during ambulation without oxygen.  Still likes to have the 2L at night "just in case" he says.

## 2014-04-26 NOTE — Progress Notes (Signed)
Patient Profile: 68 yo WM with history of CAD s/p 4V CABG 2001, chronic atrial fibrillation, COPD, OSA, obesity, venous insufficiency and diastolic CHF who presented with acute CHF and oliguric renal failure.    Subjective:  Breathing has improved. Denies chest pain. He is agitated he has to stay over the weekend. Wants to go home.    Objective: Vital signs in last 24 hours: Temp:  [97.4 F (36.3 C)-97.7 F (36.5 C)] 97.7 F (36.5 C) (07/17 0517) Pulse Rate:  [50-80] 64 (07/17 0517) Resp:  [13-19] 18 (07/17 0517) BP: (94-135)/(53-79) 111/54 mmHg (07/17 0517) SpO2:  [95 %-98 %] 95 % (07/17 0517) Weight:  [229 lb 3.2 oz (103.964 kg)-246 lb 4.1 oz (111.7 kg)] 229 lb 3.2 oz (103.964 kg) (07/17 0517) Last BM Date: 04/23/14  Intake/Output from previous day: 07/16 0701 - 07/17 0700 In: 480 [P.O.:480] Out: 4021 [Urine:175] Intake/Output this shift:    Medications Current Facility-Administered Medications  Medication Dose Route Frequency Provider Last Rate Last Dose  . 0.9 %  sodium chloride infusion  250 mL Intravenous PRN Lamar Sprinkles, MD      . acetaminophen (TYLENOL) tablet 650 mg  650 mg Oral Q4H PRN Lamar Sprinkles, MD   650 mg at 04/26/14 0458  . aspirin chewable tablet 81 mg  81 mg Oral Daily Lamar Sprinkles, MD   81 mg at 04/25/14 1609  . atorvastatin (LIPITOR) tablet 20 mg  20 mg Oral Daily Lamar Sprinkles, MD   20 mg at 04/25/14 1609  . carvedilol (COREG) tablet 6.25 mg  6.25 mg Oral BID WC Lamar Sprinkles, MD   6.25 mg at 04/25/14 1611  . FLUoxetine (PROZAC) capsule 20 mg  20 mg Oral Daily Lamar Sprinkles, MD   20 mg at 04/25/14 1609  . fluticasone (FLOVENT HFA) 110 MCG/ACT inhaler 1 puff  1 puff Inhalation BID Lamar Sprinkles, MD   1 puff at 04/25/14 2010  . furosemide (LASIX) 160 mg in dextrose 5 % 50 mL IVPB  160 mg Intravenous 3 times per day Rexene Agent, MD      . heparin injection 2,200 Units  20 Units/kg Dialysis PRN Rexene Agent, MD   2,200 Units at  04/25/14 1200  . heparin injection 2,300 Units  20 Units/kg Dialysis PRN Rexene Agent, MD      . hydrocortisone (ANUSOL-HC) 2.5 % rectal cream   Rectal TID PRN Lamar Sprinkles, MD      . insulin aspart (novoLOG) injection 0-15 Units  0-15 Units Subcutaneous TID WC Jacolyn Reedy, MD   2 Units at 04/24/14 [phone]  . insulin glargine (LANTUS) injection 16 Units  16 Units Subcutaneous QHS Lamar Sprinkles, MD   16 Units at 04/25/14 2210  . lactulose (CHRONULAC) 10 GM/15ML solution 20 g  20 g Oral BID Lamar Sprinkles, MD   20 g at 04/25/14 1609  . levothyroxine (SYNTHROID, LEVOTHROID) tablet 150 mcg  150 mcg Oral QHS Lamar Sprinkles, MD   150 mcg at 04/25/14 2210  . multivitamin with minerals tablet 1 tablet  1 tablet Oral Daily Lamar Sprinkles, MD   1 tablet at 04/25/14 1609  . ondansetron (ZOFRAN) injection 4 mg  4 mg Intravenous Q6H PRN Lamar Sprinkles, MD   4 mg at 04/22/14 2323  . oxyCODONE (Oxy IR/ROXICODONE) immediate release tablet 10 mg  10 mg Oral Q8H PRN Lamar Sprinkles, MD   10 mg at 04/25/14 2359  . pantoprazole (PROTONIX) EC tablet 80 mg  80 mg  Oral Daily Lamar Sprinkles, MD   80 mg at 04/25/14 1609  . senna (SENOKOT) tablet 8.6 mg  1 tablet Oral BID PRN Lamar Sprinkles, MD      . sodium chloride 0.9 % injection 3 mL  3 mL Intravenous Q12H Lamar Sprinkles, MD   3 mL at 04/25/14 2210  . sodium chloride 0.9 % injection 3 mL  3 mL Intravenous PRN Lamar Sprinkles, MD        PE: General appearance: alert, cooperative and no distress Neck: no JVD Lungs: CTAB Heart: regular rate and rhythm Extremities: Trace bilateral LEE. L>R which is chronic. 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   Acute on Chronic CHF: breathing has improved with fluid removal from HD. -12.6 total since admission.  -- Continue with volume removal per Nephrology. Continue BB. No ACE/ARB given CKD.  A/C CKD: (BL SCr 1.4-1.6): diuretic refractory.  -- HD started 04/22/14. Underwent 4 runs. Feeling much better and volume status significantly improved.  Peak creat 5.1--> 1.97 today. Plan per nephrology is to trial IV Lasix over the weekend and see how he does.  -- Nephrology following.   Hyponatremia- continue free water restriction. This had resolved yesterday and now slightly low at 135. Continue to monitor.   Anemia: Hgb 11.8. Continue to monitor.     LOS: 6 days   Perry Mount , PA-C 04/26/2014 8:30 AM   Patient seen and examined. Agree with assessment and plan. I/O -[id number]. No dialysis toduy. Lasix 160 mg given to see if he will diuresis without dialysis. Cr now 1.97 down from > 5. Much more comfortable. Leg edema is markedly improved. Continue free water restriction for hyponatremia.   Troy Sine, MD, [facility] 04/26/2014 9:41 AM

## 2014-04-26 NOTE — Progress Notes (Signed)
Admit: 04/20/2014 LOS: 6  50M admith A/C CHF, failed diuresis, AoCKD (SCr BL 1.4-1.6) now req RRT (first 7/13 via nontunneled HD Cath)  Subjective:  HD yesterday, 4L UF Has had 4 Tx together Weight down to 103.9, about 25lb down Feels well, eating, no SOB, no crackles, edema improved Started lasix 160 TID this AM  07/16 0701 - 07/17 0700 In: 480 [P.O.:480] Out: 4021 [Urine:175]  Filed Weights   04/25/14 1139 04/25/14 1551 04/26/14 0517  Weight: 111.7 kg (246 lb 4.1 oz) 107.9 kg (237 lb 14 oz) 103.964 kg (229 lb 3.2 oz)    Current meds: reviewed  Current Labs: reviewed   Physical Exam:  Blood pressure 94/60, pulse 70, temperature 98 F (36.7 C), temperature source Oral, resp. rate 18, height 5\' 10"  (1.778 m), weight 103.964 kg (229 lb 3.2 oz), SpO2 95.00%. Chronically ill appearing, obese RRR Crackles to basilar lung fields; lessened 3-4+ LEE b/l, pitting, chronic venous stasis changes EOMI Nonfocal  Assessment 1. AoCKD (BL SCr 1.4-1.6): diuretic refractory. HD started 04/22/14.  Now on pause after significant to see if w/ diuretics and maintain volume status + stabilize GFR  2. A/C CHF w/ Vol O/L: as per #1 and Cardiology.  Strict I/Os over weekend  Cardiology following.   3. Hyponatremia at admission: ominous, resolved with HD 4. Anemia: Stable, s/p IV Feraheme 1020 on 7/12; not on ESA 5. CAD s/p CABG, PAD 6. OSA 7. Hx/o AFib 8. HTN  Pearson Grippe MD 04/26/2014, 2:55 PM   Recent Labs Lab 04/23/14 0422 04/25/14 0450 04/26/14 0500  NA 132* 139 135*  K 3.5* 4.3 3.6*  CL 88* 97 93*  CO2 27 30 29   GLUCOSE 132* 128* 93  BUN 84* 31* 21  CREATININE 4.56* 2.42* 1.97*  CALCIUM 8.5 9.0 9.6    Recent Labs Lab 04/24/14 0533 04/25/14 0450 04/26/14 0500  WBC 6.4 5.8 6.3  HGB 9.7* 10.7* 11.8*  HCT 31.7* 35.1* 38.8*  MCV 87.8 89.8 89.8  PLT 121* 119* 111*

## 2014-04-27 LAB — BASIC METABOLIC PANEL
Anion gap: 14 (ref 5–15)
BUN: 34 mg/dL — ABNORMAL HIGH (ref 6–23)
CO2: 27 meq/L (ref 19–32)
Calcium: 9.6 mg/dL (ref 8.4–10.5)
Chloride: 92 mEq/L — ABNORMAL LOW (ref 96–112)
Creatinine, Ser: 1.98 mg/dL — ABNORMAL HIGH (ref 0.50–1.35)
GFR calc Af Amer: 38 mL/min — ABNORMAL LOW (ref >90)
GFR, EST NON AFRICAN AMERICAN: 32 mL/min — AB (ref >90)
Glucose, Bld: 154 mg/dL — ABNORMAL HIGH (ref 70–99)
POTASSIUM: 3.5 meq/L — AB (ref 3.7–5.3)
SODIUM: 133 meq/L — AB (ref 137–147)

## 2014-04-27 LAB — GLUCOSE, CAPILLARY
GLUCOSE-CAPILLARY: 130 mg/dL — AB (ref 70–99)
GLUCOSE-CAPILLARY: 132 mg/dL — AB (ref 70–99)
Glucose-Capillary: 134 mg/dL — ABNORMAL HIGH (ref 70–99)
Glucose-Capillary: 166 mg/dL — ABNORMAL HIGH (ref 70–99)

## 2014-04-27 LAB — CBC
HEMATOCRIT: 38 % — AB (ref 39.0–52.0)
Hemoglobin: 11.8 g/dL — ABNORMAL LOW (ref 13.0–17.0)
MCH: 26.9 pg (ref 26.0–34.0)
MCHC: 31.1 g/dL (ref 30.0–36.0)
MCV: 86.8 fL (ref 78.0–100.0)
Platelets: 122 10*3/uL — ABNORMAL LOW (ref 150–400)
RBC: 4.38 MIL/uL (ref 4.22–5.81)
RDW: 16 % — ABNORMAL HIGH (ref 11.5–15.5)
WBC: 6.8 10*3/uL (ref 4.0–10.5)

## 2014-04-27 MED ORDER — HYDROCORTISONE 1 % EX CREA
TOPICAL_CREAM | Freq: Two times a day (BID) | CUTANEOUS | Status: DC
  Administered 2014-04-27 – 2014-04-29 (×5): via TOPICAL
  Filled 2014-04-27: qty 28

## 2014-04-27 NOTE — Plan of Care (Signed)
Problem: Limited Adherence to Nutrition-Related Recommendations (NB-1.6) Goal: Nutrition education Formal process to instruct or train a patient/client in a skill or to impart knowledge to help patients/clients voluntarily manage or modify food choices and eating behavior to maintain or improve health. Outcome: Completed/Met Date Met:  04/27/14  RD consulted for nutrition education regarding low sodium diet.  RD provided "Low Sodium Nutrition Therapy" handout from the [employer]. Reviewed patient's dietary recall. Provided examples on ways to decrease sodium intake in diet. Discouraged intake of processed foods and use of salt shaker. Encouraged fresh fruits and vegetables (he is currently consuming canned) as well as whole grain sources of carbohydrates to maximize fiber intake.   RD discussed why it is important for patient to adhere to diet recommendations, and emphasized the role of fluids, foods to avoid, and importance of weighing self daily. Teach back method used.  Expect fair compliance.  Body mass index is 33.22 kg/(m^2). Pt meets criteria for Obesity Class I based on current BMI.  Current diet order is Renal/Carbohydrate Modified, patient is consuming approximately 65-100% of meals at this time. Labs and medications reviewed. No further nutrition interventions warranted at this time. If additional nutrition issues arise, please re-consult RD.   Arthur Holms, RD, LDN Pager #: [phone] After-Hours Pager #: [phone]

## 2014-04-27 NOTE — Progress Notes (Signed)
Patient Profile: 68 yo WM with history of CAD s/p 4V CABG 2001, chronic atrial fibrillation, COPD, OSA, obesity, venous insufficiency and diastolic CHF who presented with acute CHF and oliguric renal failure.    Subjective:  Breathing has improved. Denies chest pain. He is agitated he has to stay over the weekend. Wants to go home.    Objective: Vital signs in last 24 hours: Temp:  [97.3 F (36.3 C)-98 F (36.7 C)] 97.3 F (36.3 C) (07/18 0436) Pulse Rate:  [57-70] 61 (07/18 0436) Resp:  [18] 18 (07/18 0436) BP: (94-125)/(60-70) 125/70 mmHg (07/18 0436) SpO2:  [95 %-99 %] 98 % (07/18 0804) Weight:  [231 lb 8 oz (105.008 kg)] 231 lb 8 oz (105.008 kg) (07/18 0436) Last BM Date: 04/27/14  Intake/Output from previous day: 07/17 0701 - 07/18 0700 In: 864 [P.O.:600; IV Piggyback:264] Out: 700 [Urine:700] Intake/Output this shift: Total I/O In: 480 [P.O.:480] Out: 125 [Urine:125]  Medications Current Facility-Administered Medications  Medication Dose Route Frequency Provider Last Rate Last Dose  . 0.9 %  sodium chloride infusion  250 mL Intravenous PRN Lamar Sprinkles, MD      . acetaminophen (TYLENOL) tablet 650 mg  650 mg Oral Q4H PRN Lamar Sprinkles, MD   650 mg at 04/27/14 0237  . aspirin chewable tablet 81 mg  81 mg Oral Daily Lamar Sprinkles, MD   81 mg at 04/26/14 0932  . atorvastatin (LIPITOR) tablet 20 mg  20 mg Oral Daily Lamar Sprinkles, MD   20 mg at 04/26/14 0932  . carvedilol (COREG) tablet 6.25 mg  6.25 mg Oral BID WC Lamar Sprinkles, MD   6.25 mg at 04/27/14 0820  . FLUoxetine (PROZAC) capsule 20 mg  20 mg Oral Daily Lamar Sprinkles, MD   20 mg at 04/26/14 0932  . fluticasone (FLOVENT HFA) 110 MCG/ACT inhaler 1 puff  1 puff Inhalation BID Lamar Sprinkles, MD   1 puff at 04/27/14 0803  . furosemide (LASIX) 160 mg in dextrose 5 % 50 mL IVPB  160 mg Intravenous 3 times per day Rexene Agent, MD   160 mg at 04/27/14 0503  . heparin injection 2,200 Units  20  Units/kg Dialysis PRN Rexene Agent, MD   2,200 Units at 04/25/14 1200  . heparin injection 2,300 Units  20 Units/kg Dialysis PRN Rexene Agent, MD      . hydrocortisone (ANUSOL-HC) 2.5 % rectal cream   Rectal TID PRN Lamar Sprinkles, MD      . hydrocortisone cream 1 %   Topical BID Rexene Agent, MD      . insulin aspart (novoLOG) injection 0-15 Units  0-15 Units Subcutaneous TID WC Jacolyn Reedy, MD   2 Units at 04/27/14 [phone]  . insulin glargine (LANTUS) injection 16 Units  16 Units Subcutaneous QHS Lamar Sprinkles, MD   16 Units at 04/26/14 2124  . lactulose (CHRONULAC) 10 GM/15ML solution 20 g  20 g Oral BID Lamar Sprinkles, MD   20 g at 04/26/14 2124  . levothyroxine (SYNTHROID, LEVOTHROID) tablet 150 mcg  150 mcg Oral QHS Lamar Sprinkles, MD   150 mcg at 04/26/14 2124  . multivitamin with minerals tablet 1 tablet  1 tablet Oral Daily Lamar Sprinkles, MD   1 tablet at 04/26/14 0932  . ondansetron (ZOFRAN) injection 4 mg  4 mg Intravenous Q6H PRN Lamar Sprinkles, MD   4 mg at 04/22/14 2323  . oxyCODONE (Oxy IR/ROXICODONE) immediate release tablet 10 mg  10 mg  Oral Q8H PRN Lamar Sprinkles, MD   10 mg at 04/27/14 0820  . pantoprazole (PROTONIX) EC tablet 80 mg  80 mg Oral Daily Lamar Sprinkles, MD   80 mg at 04/26/14 0933  . senna (SENOKOT) tablet 8.6 mg  1 tablet Oral BID PRN Lamar Sprinkles, MD      . sodium chloride 0.9 % injection 3 mL  3 mL Intravenous Q12H Lamar Sprinkles, MD   3 mL at 04/26/14 2125  . sodium chloride 0.9 % injection 3 mL  3 mL Intravenous PRN Lamar Sprinkles, MD        PE: General appearance: alert, cooperative and no distress Neck: no JVD Lungs: CTAB Heart: regular rate and rhythm Extremities: Trace bilateral LEE. L>R which is chronic. Much improved Pulses: 2+ and symmetric Skin: he has several necrotic lesion on his toes.   Will get wound consult.  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   Acute on Chronic CHF: breathing has improved with fluid removal from HD. -12.6 total since admission.  -- Continue with volume removal per Nephrology. Continue BB. No ACE/ARB given CKD.  Continue trial of lasix.     A/C CKD: (BL SCr 1.4-1.6): diuretic refractory.  -- HD started 04/22/14. Underwent 4 runs. Feeling much better and volume status significantly improved.  Peak creat 5.1--> 1.97 today. Plan per nephrology is to trial IV Lasix over the weekend and see how he does.  -- Nephrology following.   Hyponatremia- continue free water restriction. This had resolved yesterday and now slightly low at 135. Continue to monitor.   Anemia: Hgb 11.8. Continue to monitor.    Thayer Headings, Brooke Bonito., MD, [facility] 04/27/2014, 10:25 AM [address],  [place] Pager [phone]

## 2014-04-27 NOTE — Progress Notes (Signed)
Patient alert and oriented x4, vital signs stable.  First two toes on right foot noted to have black areas on tips.  MD at bedside and aware, WOC consult ordered.  Dietician at bedside for diet education.  Patient denies any questions or concerns at this time.  Will continue to monitor.

## 2014-04-27 NOTE — Progress Notes (Signed)
Admit: 04/20/2014 LOS: 7  84M admith A/C CHF, failed diuresis, AoCKD (SCr BL 1.4-1.6) now req RRT (first 7/13 via nontunneled HD Cath)  Subjective:  0.7L UOP yesterday SCr didn't increase very much Weight up two pounds Has itchy spot on his chest  07/17 0701 - 07/18 0700 In: 732 [P.O.:600; IV Piggyback:132] Out: 700 [Urine:700]  Filed Weights   04/25/14 1551 04/26/14 0517 04/27/14 0436  Weight: 107.9 kg (237 lb 14 oz) 103.964 kg (229 lb 3.2 oz) 105.008 kg (231 lb 8 oz)    Current meds: reviewed inc lasix 160 IV TID Current Labs: reviewed   Physical Exam:  Blood pressure 125/70, pulse 61, temperature 97.3 F (36.3 C), temperature source Oral, resp. rate 18, height 5\' 10"  (1.778 m), weight 105.008 kg (231 lb 8 oz), SpO2 98.00%. Chronically ill appearing, obese RRR Crackles to basilar lung fields; lessened 3-4+ LEE b/l, pitting, chronic venous stasis changes EOMI Nonfocal  Assessment 1. AoCKD (BL SCr 1.4-1.6): diuretic refractory. HD started 04/22/14 via Temp HD Cath.  Now on pause after significant diuresis to see if w/ diuretics and maintain volume status + stabilize GFR.  Will have dietary education on sodium.  2. A/C CHF w/ Vol O/L: as per #1 and Cardiology.  Strict I/Os over weekend  Cardiology following.   3. Hyponatremia at admission: ominous, resolved with HD 4. Anemia: Stable, s/p IV Feraheme 1020 on 7/12; not on ESA 5. CAD s/p CABG, PAD 6. OSA 7. Hx/o AFib 8. HTN 9. Itch: hydrocortisone 1% cream  Pearson Grippe MD 04/27/2014, 8:13 AM   Recent Labs Lab 04/25/14 0450 04/26/14 0500 04/27/14 0414  NA 139 135* 133*  K 4.3 3.6* 3.5*  CL 97 93* 92*  CO2 30 29 27   GLUCOSE 128* 93 154*  BUN 31* 21 34*  CREATININE 2.42* 1.97* 1.98*  CALCIUM 9.0 9.6 9.6    Recent Labs Lab 04/25/14 0450 04/26/14 0500 04/27/14 0414  WBC 5.8 6.3 6.8  HGB 10.7* 11.8* 11.8*  HCT 35.1* 38.8* 38.0*  MCV 89.8 89.8 86.8  PLT 119* 111* 122*

## 2014-04-28 LAB — BASIC METABOLIC PANEL
Anion gap: 16 — ABNORMAL HIGH (ref 5–15)
BUN: 41 mg/dL — ABNORMAL HIGH (ref 6–23)
CALCIUM: 9.4 mg/dL (ref 8.4–10.5)
CO2: 27 mEq/L (ref 19–32)
Chloride: 92 mEq/L — ABNORMAL LOW (ref 96–112)
Creatinine, Ser: 1.67 mg/dL — ABNORMAL HIGH (ref 0.50–1.35)
GFR, EST AFRICAN AMERICAN: 46 mL/min — AB (ref >90)
GFR, EST NON AFRICAN AMERICAN: 40 mL/min — AB (ref >90)
GLUCOSE: 107 mg/dL — AB (ref 70–99)
Potassium: 3.4 mEq/L — ABNORMAL LOW (ref 3.7–5.3)
SODIUM: 135 meq/L — AB (ref 137–147)

## 2014-04-28 LAB — GLUCOSE, CAPILLARY
GLUCOSE-CAPILLARY: 125 mg/dL — AB (ref 70–99)
Glucose-Capillary: 102 mg/dL — ABNORMAL HIGH (ref 70–99)
Glucose-Capillary: 122 mg/dL — ABNORMAL HIGH (ref 70–99)
Glucose-Capillary: 143 mg/dL — ABNORMAL HIGH (ref 70–99)

## 2014-04-28 LAB — CBC
HCT: 37.2 % — ABNORMAL LOW (ref 39.0–52.0)
HEMOGLOBIN: 11.5 g/dL — AB (ref 13.0–17.0)
MCH: 27.1 pg (ref 26.0–34.0)
MCHC: 30.9 g/dL (ref 30.0–36.0)
MCV: 87.7 fL (ref 78.0–100.0)
Platelets: 142 10*3/uL — ABNORMAL LOW (ref 150–400)
RBC: 4.24 MIL/uL (ref 4.22–5.81)
RDW: 16.2 % — AB (ref 11.5–15.5)
WBC: 7.9 10*3/uL (ref 4.0–10.5)

## 2014-04-28 MED ORDER — FUROSEMIDE 80 MG PO TABS
160.0000 mg | ORAL_TABLET | Freq: Two times a day (BID) | ORAL | Status: DC
  Administered 2014-04-28 – 2014-04-29 (×2): 160 mg via ORAL
  Filled 2014-04-28 (×4): qty 2

## 2014-04-28 NOTE — Consult Note (Signed)
WOC wound consult note Reason for Consult:Complex medical patient with LE edema and necrotic areas on the distal tips of two toes on the right foot. Patient states that he "scraped them on the concrete" at his home. When counseled regarding wearing protective shoe wear, he states that the wraps that the [facility] had applied were so thick he could no longer wear his shoes. Wound type: Likely arterial insufficiency or mixed (arterial and venous) insufficiency PLUS trauma Pressure Ulcer POA: No Measurement: Right great toe: 0.5cm x 1.5cm.  Right foot, second digit:  0.4cm round Wound FBP:ZWCHEN to visualize wound bed due to the presence of stable, dry, soft eschar Drainage (amount, consistency, odor) None Periwound: intact, clear, dry Dressing procedure/placement/frequency: Patient taught about the importance of wearing protective shoe wear.  Patient does not want LEs wrapped at this time.  I will care for the two areas of eschar by drying them out and facilitating an infection free environment via the application of a betadine swabstick and allowing this to air dry prior to dressing with a dry sterile dressing and securing with tape. I have additionally provided bilateral pressure redistribution boots to both protect from traumatic injuries while in bed and prevent from pressure ulcerations.Finally, it is noted that the patient is sitting on bed pillows because his "tailbone hurts".  No erythema or open areas noted at this time.  A pressure redistribution chair pad is provided for his use while here in the hospital and should be taken home with him at time of discharge. [place] nursing team will not follow, but will remain available to this patient, the nursing and medical team.  Please re-consult if needed. Thanks, Maudie Flakes, MSN, RN, [place], [place], [place] [phone])

## 2014-04-28 NOTE — Progress Notes (Signed)
Patient Profile: 68 yo WM with history of CAD s/p 4V CABG 2001, chronic atrial fibrillation, COPD, OSA, obesity, venous insufficiency and diastolic CHF who presented with acute CHF and oliguric renal failure.    Subjective:  Breathing has improved. Denies chest pain. He is agitated he has to stay over the weekend. Wants to go home.    Objective: Vital signs in last 24 hours: Temp:  [97.3 F (36.3 C)-97.4 F (36.3 C)] 97.3 F (36.3 C) (07/19 0600) Pulse Rate:  [51-77] 69 (07/19 0600) Resp:  [18-20] 18 (07/19 0816) BP: (103-118)/(46-60) 108/57 mmHg (07/19 0816) SpO2:  [93 %-100 %] 94 % (07/19 0816) Weight:  [230 lb 6.4 oz (104.509 kg)] 230 lb 6.4 oz (104.509 kg) (07/19 0600) Last BM Date: 04/27/14  Intake/Output from previous day: 07/18 0701 - 07/19 0700 In: 1029 [P.O.:960; I.V.:3; IV Piggyback:66] Out: 2125 [Urine:2125] Intake/Output this shift: Total I/O In: 240 [P.O.:240] Out: 120 [Urine:120]  Medications Current Facility-Administered Medications  Medication Dose Route Frequency Provider Last Rate Last Dose  . 0.9 %  sodium chloride infusion  250 mL Intravenous PRN Lamar Sprinkles, MD      . acetaminophen (TYLENOL) tablet 650 mg  650 mg Oral Q4H PRN Lamar Sprinkles, MD   650 mg at 04/28/14 1443  . aspirin chewable tablet 81 mg  81 mg Oral Daily Lamar Sprinkles, MD   81 mg at 04/27/14 1030  . atorvastatin (LIPITOR) tablet 20 mg  20 mg Oral Daily Lamar Sprinkles, MD   20 mg at 04/27/14 1029  . carvedilol (COREG) tablet 6.25 mg  6.25 mg Oral BID WC Lamar Sprinkles, MD   6.25 mg at 04/28/14 1540  . FLUoxetine (PROZAC) capsule 20 mg  20 mg Oral Daily Lamar Sprinkles, MD   20 mg at 04/27/14 1029  . fluticasone (FLOVENT HFA) 110 MCG/ACT inhaler 1 puff  1 puff Inhalation BID Lamar Sprinkles, MD   1 puff at 04/27/14 2028  . furosemide (LASIX) tablet 160 mg  160 mg Oral BID Rexene Agent, MD      . heparin injection 2,200 Units  20 Units/kg Dialysis PRN Rexene Agent, MD    2,200 Units at 04/25/14 1200  . heparin injection 2,300 Units  20 Units/kg Dialysis PRN Rexene Agent, MD      . hydrocortisone (ANUSOL-HC) 2.5 % rectal cream   Rectal TID PRN Lamar Sprinkles, MD      . hydrocortisone cream 1 %   Topical BID Rexene Agent, MD      . insulin aspart (novoLOG) injection 0-15 Units  0-15 Units Subcutaneous TID WC Jacolyn Reedy, MD   2 Units at 04/27/14 1651  . insulin glargine (LANTUS) injection 16 Units  16 Units Subcutaneous QHS Lamar Sprinkles, MD   16 Units at 04/27/14 2131  . lactulose (CHRONULAC) 10 GM/15ML solution 20 g  20 g Oral BID Lamar Sprinkles, MD   20 g at 04/27/14 2113  . levothyroxine (SYNTHROID, LEVOTHROID) tablet 150 mcg  150 mcg Oral QHS Lamar Sprinkles, MD   150 mcg at 04/27/14 2113  . multivitamin with minerals tablet 1 tablet  1 tablet Oral Daily Lamar Sprinkles, MD   1 tablet at 04/27/14 1029  . ondansetron (ZOFRAN) injection 4 mg  4 mg Intravenous Q6H PRN Lamar Sprinkles, MD   4 mg at 04/22/14 2323  . oxyCODONE (Oxy IR/ROXICODONE) immediate release tablet 10 mg  10 mg Oral Q8H PRN Lamar Sprinkles, MD   10 mg at  04/28/14 0106  . pantoprazole (PROTONIX) EC tablet 80 mg  80 mg Oral Daily Lamar Sprinkles, MD   80 mg at 04/27/14 1030  . senna (SENOKOT) tablet 8.6 mg  1 tablet Oral BID PRN Lamar Sprinkles, MD      . sodium chloride 0.9 % injection 3 mL  3 mL Intravenous Q12H Lamar Sprinkles, MD   3 mL at 04/27/14 2110  . sodium chloride 0.9 % injection 3 mL  3 mL Intravenous PRN Lamar Sprinkles, MD        PE: General appearance: alert, cooperative and no distress Neck: no JVD Lungs: CTAB Heart: regular rate and rhythm Extremities: Trace bilateral LEE. L>R which is chronic. Much improved Pulses: 2+ and symmetric Skin: he has several necrotic lesion on his toes.   Will get wound consult.  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   Acute on Chronic CHF: breathing has improved with fluid removal from HD. -12.8 total since admission.  -- Continue with volume removal per Nephrology. Continue BB. No ACE/ARB given CKD.  He has done well on PO Lasix. Anticipate DC tomorrow if he continues to do well.    A/C CKD: (BL SCr 1.4-1.6): diuretic refractory.  -- HD started 04/22/14. Underwent 4 runs. Feeling much better and volume status significantly improved.  Peak creat 5.1--> 1.67 today. -- Nephrology following.   Hyponatremia- continue free water restriction. This had resolved yesterday and now slightly low at 135. Continue to monitor.   Anemia: Hgb 11.5. Continue to monitor.   Thayer Headings, Brooke Bonito., MD, [facility] 04/28/2014, 8:30 AM [address],  [place] Pager [phone]

## 2014-04-28 NOTE — Progress Notes (Signed)
Admit: 04/20/2014 LOS: 8  53M admith A/C CHF, failed diuresis, AoCKD (SCr BL 1.4-1.6) now req RRT (first 7/13 via nontunneled HD Cath)  Subjective:  2.1L UOP yesterday on 160 IV TID lasix  Weight Stable SCr further improved, lytes stable Eating well Saw dietician about Na restriction  07/18 0701 - 07/19 0700 In: 1029 [P.O.:960; I.V.:3; IV Piggyback:66] Out: 2125 [Urine:2125]  Filed Weights   04/26/14 0517 04/27/14 0436 04/28/14 0600  Weight: 103.964 kg (229 lb 3.2 oz) 105.008 kg (231 lb 8 oz) 104.509 kg (230 lb 6.4 oz)    Current meds: reviewed inc lasix 160 IV TID Current Labs: reviewed   Physical Exam:  Blood pressure 118/55, pulse 69, temperature 97.3 F (36.3 C), temperature source Oral, resp. rate 20, height 5\' 10"  (1.778 m), weight 104.509 kg (230 lb 6.4 oz), SpO2 93.00%. Chronically ill appearing, obese RRR Crackles to basilar lung fields; lessened 3-4+ LEE b/l, pitting, chronic venous stasis changes EOMI Nonfocal  Assessment 1. AoCKD (BL SCr 1.4-1.6): diuretic refractory. HD started 04/22/14 via Temp HD Cath.  Now on pause after significant diuresis and is responsive to diuretics with stable weight, good UOP, and improving GFR.  Does not need further HD.  Pull nontunneld HD cath today. Change to 160 PO BID lasix. If stable over next 24h w/ good UOP anticipate discharge.  2. A/C CHF w/ Vol O/L: as per #1 and Cardiology.  Strict I/Os over weekend  Cardiology following.   3. Hyponatremia at admission: ominous, resolved with HD 4. Anemia: Stable, s/p IV Feraheme 1020 on 7/12; not on ESA 5. CAD s/p CABG, PAD 6. OSA 7. Hx/o AFib 8. HTN 9. Itch: hydrocortisone 1% cream  Pearson Grippe MD 04/28/2014, 8:10 AM   Recent Labs Lab 04/26/14 0500 04/27/14 0414 04/28/14 0318  NA 135* 133* 135*  K 3.6* 3.5* 3.4*  CL 93* 92* 92*  CO2 29 27 27   GLUCOSE 93 154* 107*  BUN 21 34* 41*  CREATININE 1.97* 1.98* 1.67*  CALCIUM 9.6 9.6 9.4    Recent Labs Lab 04/26/14 0500  04/27/14 0414 04/28/14 0318  WBC 6.3 6.8 7.9  HGB 11.8* 11.8* 11.5*  HCT 38.8* 38.0* 37.2*  MCV 89.8 86.8 87.7  PLT 111* 122* 142*

## 2014-04-29 DIAGNOSIS — I110 Hypertensive heart disease with heart failure: Secondary | ICD-10-CM

## 2014-04-29 DIAGNOSIS — N179 Acute kidney failure, unspecified: Secondary | ICD-10-CM

## 2014-04-29 DIAGNOSIS — I509 Heart failure, unspecified: Secondary | ICD-10-CM

## 2014-04-29 DIAGNOSIS — N189 Chronic kidney disease, unspecified: Secondary | ICD-10-CM

## 2014-04-29 DIAGNOSIS — I4891 Unspecified atrial fibrillation: Secondary | ICD-10-CM

## 2014-04-29 LAB — BASIC METABOLIC PANEL
Anion gap: 17 — ABNORMAL HIGH (ref 5–15)
BUN: 44 mg/dL — AB (ref 6–23)
CHLORIDE: 93 meq/L — AB (ref 96–112)
CO2: 24 meq/L (ref 19–32)
Calcium: 9.8 mg/dL (ref 8.4–10.5)
Creatinine, Ser: 1.62 mg/dL — ABNORMAL HIGH (ref 0.50–1.35)
GFR calc Af Amer: 48 mL/min — ABNORMAL LOW (ref >90)
GFR calc non Af Amer: 41 mL/min — ABNORMAL LOW (ref >90)
GLUCOSE: 107 mg/dL — AB (ref 70–99)
POTASSIUM: 3.7 meq/L (ref 3.7–5.3)
Sodium: 134 mEq/L — ABNORMAL LOW (ref 137–147)

## 2014-04-29 LAB — CBC
HEMATOCRIT: 38.3 % — AB (ref 39.0–52.0)
HEMOGLOBIN: 12.2 g/dL — AB (ref 13.0–17.0)
MCH: 27.6 pg (ref 26.0–34.0)
MCHC: 31.9 g/dL (ref 30.0–36.0)
MCV: 86.7 fL (ref 78.0–100.0)
Platelets: 140 10*3/uL — ABNORMAL LOW (ref 150–400)
RBC: 4.42 MIL/uL (ref 4.22–5.81)
RDW: 16.6 % — ABNORMAL HIGH (ref 11.5–15.5)
WBC: 7.1 10*3/uL (ref 4.0–10.5)

## 2014-04-29 LAB — GLUCOSE, CAPILLARY
Glucose-Capillary: 125 mg/dL — ABNORMAL HIGH (ref 70–99)
Glucose-Capillary: 144 mg/dL — ABNORMAL HIGH (ref 70–99)

## 2014-04-29 MED ORDER — CARVEDILOL 3.125 MG PO TABS: 3.1250 mg | ORAL_TABLET | Freq: Two times a day (BID) | ORAL | Status: DC

## 2014-04-29 MED ORDER — FUROSEMIDE 80 MG PO TABS: 160.0000 mg | ORAL_TABLET | Freq: Two times a day (BID) | ORAL | Status: DC

## 2014-04-29 MED ORDER — POTASSIUM CHLORIDE ER 20 MEQ PO TBCR: 40.0000 meq | EXTENDED_RELEASE_TABLET | Freq: Every day | ORAL | Status: DC

## 2014-04-29 MED ORDER — CARVEDILOL 3.125 MG PO TABS
3.1250 mg | ORAL_TABLET | Freq: Two times a day (BID) | ORAL | Status: DC
Start: 2014-04-29 — End: 2014-04-29
  Filled 2014-04-29 (×2): qty 1

## 2014-04-29 NOTE — Progress Notes (Signed)
Co-signed for Irfat Habib RN/BSN for medication administration, assessments, Is&Os, Vital Signs, Progress Notes, Care Plans, and Patient education. Aman Batley M RN/BSN 

## 2014-04-29 NOTE — Discharge Summary (Signed)
Patient seen and examined and history reviewed. Agree with above findings and plan. See earlier rounding note.  [name], [place] 04/29/2014 1:16 PM

## 2014-04-29 NOTE — Progress Notes (Signed)
Patient Profile: 68 yo WM with history of CAD s/p 4V CABG 2001, chronic atrial fibrillation, COPD, OSA, obesity, venous insufficiency and diastolic CHF who presented with acute CHF and oliguric renal failure.    Subjective: Denies SOB. No complaints. Ready to go home.   Objective: Vital signs in last 24 hours: Temp:  [97.8 F (36.6 C)] 97.8 F (36.6 C) (07/19 2046) Pulse Rate:  [54-60] 54 (07/19 2046) Resp:  [18-20] 18 (07/19 2046) BP: (92-108)/(49-57) 104/49 mmHg (07/19 2046) SpO2:  [94 %-98 %] 96 % (07/19 2046) Last BM Date: 04/27/14  Intake/Output from previous day: 07/19 0701 - 07/20 0700 In: 943 [P.O.:940; I.V.:3] Out: 545 [Urine:545] Intake/Output this shift: Total I/O In: -  Out: 225 [Urine:225]  Medications Current Facility-Administered Medications  Medication Dose Route Frequency Provider Last Rate Last Dose  . 0.9 %  sodium chloride infusion  250 mL Intravenous PRN Lamar Sprinkles, MD      . acetaminophen (TYLENOL) tablet 650 mg  650 mg Oral Q4H PRN Lamar Sprinkles, MD   650 mg at 04/28/14 1017  . aspirin chewable tablet 81 mg  81 mg Oral Daily Lamar Sprinkles, MD   81 mg at 04/28/14 1036  . atorvastatin (LIPITOR) tablet 20 mg  20 mg Oral Daily Lamar Sprinkles, MD   20 mg at 04/28/14 1036  . carvedilol (COREG) tablet 6.25 mg  6.25 mg Oral BID WC Lamar Sprinkles, MD   6.25 mg at 04/28/14 1724  . FLUoxetine (PROZAC) capsule 20 mg  20 mg Oral Daily Lamar Sprinkles, MD   20 mg at 04/28/14 1036  . fluticasone (FLOVENT HFA) 110 MCG/ACT inhaler 1 puff  1 puff Inhalation BID Lamar Sprinkles, MD   1 puff at 04/28/14 2018  . furosemide (LASIX) tablet 160 mg  160 mg Oral BID Rexene Agent, MD   160 mg at 04/28/14 1724  . heparin injection 2,200 Units  20 Units/kg Dialysis PRN Rexene Agent, MD   2,200 Units at 04/25/14 1200  . heparin injection 2,300 Units  20 Units/kg Dialysis PRN Rexene Agent, MD      . hydrocortisone (ANUSOL-HC) 2.5 % rectal cream   Rectal TID PRN  Lamar Sprinkles, MD      . hydrocortisone cream 1 %   Topical BID Rexene Agent, MD      . insulin aspart (novoLOG) injection 0-15 Units  0-15 Units Subcutaneous TID WC Jacolyn Reedy, MD   2 Units at 04/29/14 [id number]  . insulin glargine (LANTUS) injection 16 Units  16 Units Subcutaneous QHS Lamar Sprinkles, MD   16 Units at 04/28/14 2234  . lactulose (CHRONULAC) 10 GM/15ML solution 20 g  20 g Oral BID Lamar Sprinkles, MD   20 g at 04/28/14 2102  . levothyroxine (SYNTHROID, LEVOTHROID) tablet 150 mcg  150 mcg Oral QHS Lamar Sprinkles, MD   150 mcg at 04/28/14 2103  . multivitamin with minerals tablet 1 tablet  1 tablet Oral Daily Lamar Sprinkles, MD   1 tablet at 04/28/14 1036  . ondansetron (ZOFRAN) injection 4 mg  4 mg Intravenous Q6H PRN Lamar Sprinkles, MD   4 mg at 04/22/14 2323  . oxyCODONE (Oxy IR/ROXICODONE) immediate release tablet 10 mg  10 mg Oral Q8H PRN Lamar Sprinkles, MD   10 mg at 04/29/14 0252  . pantoprazole (PROTONIX) EC tablet 80 mg  80 mg Oral Daily Lamar Sprinkles, MD   80 mg at 04/28/14 1038  . senna (SENOKOT) tablet 8.6 mg  1  tablet Oral BID PRN Lamar Sprinkles, MD      . sodium chloride 0.9 % injection 3 mL  3 mL Intravenous Q12H Lamar Sprinkles, MD   3 mL at 04/28/14 2106  . sodium chloride 0.9 % injection 3 mL  3 mL Intravenous PRN Lamar Sprinkles, MD        PE: General appearance: alert, cooperative and no distress Lungs: clear to auscultation bilaterally Heart: regular rate and rhythm Extremities: trace bilateral LEE Pulses: 2+ and symmetric Skin: warm and dry Neurologic: Grossly normal  Lab Results:   Recent Labs  04/27/14 0414 04/28/14 0318 04/29/14 0400  WBC 6.8 7.9 7.1  HGB 11.8* 11.5* 12.2*  HCT 38.0* 37.2* 38.3*  PLT 122* 142* 140*   BMET  Recent Labs  04/27/14 0414 04/28/14 0318 04/29/14 0400  NA 133* 135* 134*  K 3.5* 3.4* 3.7  CL 92* 92* 93*  CO2 27 27 24   GLUCOSE 154* 107* 107*  BUN 34* 41* 44*  CREATININE 1.98* 1.67* 1.62*    CALCIUM 9.6 9.4 9.8     Assessment/Plan  Active Problems:   Acute on chronic systolic and diastolic heart failure, NYHA class 4   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 Acute on Chronic CHF: Stable. Improved after volume removal with temporary HD. Now back on diuretic thearpy. Continue 160 mg  Lasix, PO BID.   2. A/C CKD: Scr continues to improve at 1.62 today down from 1.67 yesterday. Peak was 5.1. HD on hold per nephrology. He is now back on PO Lasix at 160 mg BID. UOP -795 mL in past 24 hours.   3. Hyponatremia: Na today is 134. Continue free water restriction.  4. Anemia: Hgb improved to 12.2 (11.5 yesterday)    LOS: 9 days    Brittainy M. Ladoris Gene 04/29/2014 6:19 AM  Patient seen and examined and history reviewed. Agree with above findings and plan. Patient is doing well today. Appears to be at dry weight and is back on oral lasix with negative fluid balance. He is in Afib with slow ventricular response. Will reduce Coreg to 3.125 mg bid. He is stable for DC today. Needs follow up with cardiology and Renal.   Laurin Paulo [place], [place] 04/29/2014 10:17 AM

## 2014-04-29 NOTE — Progress Notes (Signed)
Co-signed for Irfat Habib RN/BSN for medication administration, assessments, Is&Os, Vital Signs, Progress Notes, Care Plans, and Patient education. Dedria Endres M RN/BSN 

## 2014-04-29 NOTE — Discharge Summary (Signed)
Physician Discharge Summary  Patient ID: [name] MRN: [id number] DOB/AGE: [birth year] 70 y.o.  Admit date: 04/20/2014 Discharge date: 04/29/2014  Primary Cardiologist: Dr. 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 He presented to [facility] on 04/20/14 with complaints of weight gain, LEE and dyspnea, despite medication compliance and adherence to a low sodium diet. He also noted decreased urine output. He denied chest pain. In the ER, his CXR was unremarkable. EKG demonstrated Afib, however rate was controlled. BMP was elevated at 1770. He was also noted to have acute olgiuric renal failure with a SCr of 3.74 and BUN of 105. His condition was felt due to cardiorenal syndrome. He was admitted. Details regarding treatment are outlined below.   1. Acute Renal Failure: Nephrology was consulted. On arrival, review of his home meds revealed that he was on Torsemide, Spirolactone and once weakly Metolazone.  All three agents were discontinued. Initially, he was placed on high dose IV Lasix, 120 mg BID. His renal function worsened, there was no increase in urine output and he became diuretic resistant. Scr. Peaked at 5.1. Subsequently, he underwent insertion of a temporary dialysis catheter. He underwent HD for volume removal. Renal function improved. Acute HF improved. He  was placed back on diuretics and was noted to have good UOP and his Scr stablized and returned back to baseline ~1.6. Nephrology felt that no further HD was needed and felt that he was stable from a renal standpoint for discharge. It was recommended that he be discharged home on 160 mg PO Lasix, BID. Spironolactone and Metolazone were note resumed at time of discharge.   2. Acute on Chronic HF: 2D echo revealed low normal systolic function, with an EF of 50-55%. His symptoms improved with volume removal through HD. A total of 12.762L was removed during hospitalization. Euvolemic state was achieved. He was discharged home on 160 mg of PO Lasix BID, as well as 3.125 mg of Coreg BID.  3. Chronic Atrial Fibrillation: Ventricular rate remained stable, but was noted to have slight bradycardia on telemetry with HR in the 50s. His Coreg was reduced from 6.25 to 3.125 mg BID. He is not a coumadin candidate.   4. CAD: H/o CABG x 4. H/o of nonischemic NST 06/29/13. He denied presence of chest pain. Cardiac enzymes were negative. 2 Decho with normal systolic function and no evidence of WMA. No ischemic w/u was indicated.  On hospital day 9, he was last seen and examined by Dr. Martinique, who determined he was stable for discharge home. Cardiology f/u has been arranged with Richardson Dopp, PA-C. He will continue to be followed, long term, by Dr. Angelena Form. F/u with Nephrology was arranged with Dr. Joelyn Oms.   Hospital Course:   Consults: nephrology  Significant Diagnostic Studies:   2D echo 04/21/14 Study Conclusions  - Left ventricle: The cavity size was normal. Wall thickness was normal. Systolic function was normal.  The estimated ejection fraction was in the range of 50% to 55%. Doppler parameters are consistent with both elevated ventricular end-diastolic filling pressure and elevated left atrial filling pressure. - Mitral valve: Calcified annulus. Mildly thickened leaflets . - Left atrium: Moderately to  severely dilated. - Right ventricle: The cavity size was moderately dilated. - Right atrium: The atrium was moderately dilated. - Atrial septum: No defect or patent foramen ovale was identified.    Treatments: See Hospital Course  Discharge Exam: Blood pressure 130/70, pulse 74, temperature 98 F (36.7 C), temperature source Oral, resp. rate 19, height 5\' 10"  (1.778 m), weight 230 lb 6.1 oz (104.5 kg), SpO2 96.00%.   Disposition: 03-Skilled Nursing Facility      Discharge Instructions   Diet - low sodium heart healthy    Complete by:  As directed      Increase activity slowly    Complete by:  As directed             Medication List    STOP taking these medications       metolazone 2.5 MG tablet  Commonly known as:  ZAROXOLYN     spironolactone 25 MG tablet  Commonly known as:  ALDACTONE     torsemide 20 MG tablet  Commonly known as:  DEMADEX      TAKE these medications       aspirin 81 MG chewable tablet  Chew 1 tablet (81 mg total) by mouth daily.     atorvastatin 20 MG tablet  Commonly known as:  LIPITOR  Take 20 mg by mouth daily.     BAYER CONTOUR TEST test strip  Generic drug:  glucose blood  USE TO TEST BLOOD SUGAR TWICE DAILY     carvedilol 3.125 MG tablet  Commonly known as:  COREG  Take 1 tablet (3.125 mg total) by mouth 2 (two) times daily with a meal.     colchicine 0.6 MG tablet  Take 0.6 mg by mouth daily as needed (for gout).     FLUoxetine 20 MG capsule  Commonly known as:  PROZAC  Take 20 mg by mouth daily.     fluticasone 110 MCG/ACT inhaler  Commonly known as:  FLOVENT HFA  Inhale 1 puff into the lungs 2 (two) times daily.     furosemide 80 MG tablet  Commonly known as:  LASIX  Take 2 tablets (160 mg total) by mouth 2 (two) times daily.     gabapentin 300 MG capsule  Commonly known as:  NEURONTIN  Take 1 capsule (300 mg total) by mouth daily.     hydrocortisone 2.5 % rectal cream  Commonly known as:  ANUSOL-HC  Place  rectally 3 (three) times daily as needed for hemorrhoids or itching.     lactulose 10 GM/15ML solution  Commonly known as:  CHRONULAC  Take 20 g by mouth 2 (two) times daily.     LANTUS SOLOSTAR 100 UNIT/ML Solostar Pen  Generic drug:  Insulin Glargine  Inject 16 Units into the skin at bedtime.     levothyroxine 150 MCG tablet  Commonly known as:  SYNTHROID, LEVOTHROID  Take 150 mcg by mouth at bedtime.     multivitamin with minerals Tabs tablet  Take 1 tablet by mouth daily.     omeprazole 20 MG capsule  Commonly known as:  PRILOSEC  Take 20 mg by mouth daily.     Oxycodone HCl 10 MG Tabs  Take 10 mg by mouth every 8 (eight)  hours as needed (for pain).     Potassium Chloride ER 20 MEQ Tbcr  Take 40 mEq by mouth daily.     senna 8.6 MG Tabs tablet  Commonly known as:  SENOKOT  Take 1 tablet by mouth 2 (two) times daily as needed for mild constipation.       Follow-up Information   Follow up with [facility]. (Center; Registered Nurse, Physical Therapy, Occupational Therapy Services to start within 24-48 hours of discharge)    Contact information:   [address] [place] [address] [phone]       Follow up with Rexene Agent, MD On 05/16/2014. (10:30 AM  Nephroogy follow-up (kidney specialist))    Specialty:  Nephrology   Contact information:   [place] [place] [id number] [phone]       Follow up with Richardson Dopp, PA-C On 05/14/2014. (2:20 pm (cardiology follow-up with Dr. Camillia Herter PA))    Specialty:  Physician Assistant   Contact information:   [address] [address] [phone]      TIME SPENT ON DISCHARGE, INCLUDING PHYSICIAN TIME: >30 MINUTES  Signed: Lyda Jester 04/29/2014, 12:29 PM

## 2014-04-29 NOTE — Discharge Instructions (Signed)
Heart Failure  Heart failure is a condition in which the heart has trouble pumping blood. This means your heart does not pump blood efficiently for your body to work well. In some cases of heart failure, fluid may back up into your lungs or you may have swelling (edema) in your lower legs. Heart failure is usually a long-term (chronic) condition. It is important for you to take good care of yourself and follow your caregiver's treatment plan.  CAUSES   Some health conditions can cause heart failure. Those health conditions include:  · High blood pressure (hypertension) causes the heart muscle to work harder than normal. When pressure in the blood vessels is high, the heart needs to pump (contract) with more force in order to circulate blood throughout the body. High blood pressure eventually causes the heart to become stiff and weak.  · Coronary artery disease (CAD) is the buildup of cholesterol and fat (plaque) in the arteries of the heart. The blockage in the arteries deprives the heart muscle of oxygen and blood. This can cause chest pain and may lead to a heart attack. High blood pressure can also contribute to CAD.  · Heart attack (myocardial infarction) occurs when 1 or more arteries in the heart become blocked. The loss of oxygen damages the muscle tissue of the heart. When this happens, part of the heart muscle dies. The injured tissue does not contract as well and weakens the heart's ability to pump blood.  · Abnormal heart valves can cause heart failure when the heart valves do not open and close properly. This makes the heart muscle pump harder to keep the blood flowing.  · Heart muscle disease (cardiomyopathy or myocarditis) is damage to the heart muscle from a variety of causes. These can include drug or alcohol abuse, infections, or unknown reasons. These can increase the risk of heart failure.  · Lung disease makes the heart work harder because the lungs do not work properly. This can cause a strain  on the heart, leading it to fail.  · Diabetes increases the risk of heart failure. High blood sugar contributes to high fat (lipid) levels in the blood. Diabetes can also cause slow damage to tiny blood vessels that carry important nutrients to the heart muscle. When the heart does not get enough oxygen and food, it can cause the heart to become weak and stiff. This leads to a heart that does not contract efficiently.  · Other conditions can contribute to heart failure. These include abnormal heart rhythms, thyroid problems, and low blood counts (anemia).  Certain unhealthy behaviors can increase the risk of heart failure. Those unhealthy behaviors include:  · Being overweight.  · Smoking or chewing tobacco.  · Eating foods high in fat and cholesterol.  · Abusing illicit drugs or alcohol.  · Lacking physical activity.  SYMPTOMS   Heart failure symptoms may vary and can be hard to detect. Symptoms may include:  · Shortness of breath with activity, such as climbing stairs.  · Persistent cough.  · Swelling of the feet, ankles, legs, or abdomen.  · Unexplained weight gain.  · Difficulty breathing when lying flat (orthopnea).  · Waking from sleep because of the need to sit up and get more air.  · Rapid heartbeat.  · Fatigue and loss of energy.  · Feeling lightheaded, dizzy, or close to fainting.  · Loss of appetite.  · Nausea.  · Increased urination during the night (nocturia).  DIAGNOSIS   A diagnosis   of heart failure is based on your history, symptoms, physical examination, and diagnostic tests.  Diagnostic tests for heart failure may include:  · Echocardiography.  · Electrocardiography.  · Chest X-ray.  · Blood tests.  · Exercise stress test.  · Cardiac angiography.  · Radionuclide scans.  TREATMENT   Treatment is aimed at managing the symptoms of heart failure. Medicines, behavioral changes, or surgical intervention may be necessary to treat heart failure.  · Medicines to help treat heart failure may  include:  ¨ Angiotensin-converting enzyme (ACE) inhibitors. This type of medicine blocks the effects of a blood protein called angiotensin-converting enzyme. ACE inhibitors relax (dilate) the blood vessels and help lower blood pressure.  ¨ Angiotensin receptor blockers. This type of medicine blocks the actions of a blood protein called angiotensin. Angiotensin receptor blockers dilate the blood vessels and help lower blood pressure.  ¨ Water pills (diuretics). Diuretics cause the kidneys to remove salt and water from the blood. The extra fluid is removed through urination. This loss of extra fluid lowers the volume of blood the heart pumps.  ¨ Beta blockers. These prevent the heart from beating too fast and improve heart muscle strength.  ¨ Digitalis. This increases the force of the heartbeat.  · Healthy behavior changes include:  ¨ Obtaining and maintaining a healthy weight.  ¨ Stopping smoking or chewing tobacco.  ¨ Eating heart healthy foods.  ¨ Limiting or avoiding alcohol.  ¨ Stopping illicit drug use.  ¨ Physical activity as directed by your caregiver.  · Surgical treatment for heart failure may include:  ¨ A procedure to open blocked arteries, repair damaged heart valves, or remove damaged heart muscle tissue.  ¨ A pacemaker to improve heart muscle function and control certain abnormal heart rhythms.  ¨ An internal cardioverter defibrillator to treat certain serious abnormal heart rhythms.  ¨ A left ventricular assist device to assist the pumping ability of the heart.  HOME CARE INSTRUCTIONS   · Take your medicine as directed by your caregiver. Medicines are important in reducing the workload of your heart, slowing the progression of heart failure, and improving your symptoms.  ¨ Do not stop taking your medicine unless directed by your caregiver.  ¨ Do not skip any dose of medicine.  ¨ Refill your prescriptions before you run out of medicine. Your medicines are needed every day.  ¨ Take over-the-counter  medicine only as directed by your caregiver or pharmacist.  · Engage in moderate physical activity if directed by your caregiver. Moderate physical activity can benefit some people. The elderly and people with severe heart failure should consult with a caregiver for physical activity recommendations.  · Eat heart healthy foods. Food choices should be free of trans fat and low in saturated fat, cholesterol, and salt (sodium). Healthy choices include fresh or frozen fruits and vegetables, fish, lean meats, legumes, fat-free or low-fat dairy products, and whole grain or high fiber foods. Talk to a dietitian to learn more about heart healthy foods.  · Limit sodium if directed by your caregiver. Sodium restriction may reduce symptoms of heart failure in some people. Talk to a dietitian to learn more about heart healthy seasonings.  · Use healthy cooking methods. Healthy cooking methods include roasting, grilling, broiling, baking, poaching, steaming, or stir-frying. Talk to a dietitian to learn more about healthy cooking methods.  · Limit fluids if directed by your caregiver. Fluid restriction may reduce symptoms of heart failure in some people.  · Weigh yourself every day.   Daily weights are important in the early recognition of excess fluid. You should weigh yourself every morning after you urinate and before you eat breakfast. Wear the same amount of clothing each time you weigh yourself. Record your daily weight. Provide your caregiver with your weight record.  · Monitor and record your blood pressure if directed by your caregiver.  · Check your pulse if directed by your caregiver.  · Lose weight if directed by your caregiver. Weight loss may reduce symptoms of heart failure in some people.  · Stop smoking or chewing tobacco. Nicotine makes your heart work harder by causing your blood vessels to constrict. Do not use nicotine gum or patches before talking to your caregiver.  · Schedule and attend follow-up visits as  directed by your caregiver. It is important to keep all your appointments.  · Limit alcohol intake to no more than 1 drink per day for nonpregnant women and 2 drinks per day for men. Drinking more than that is harmful to your heart. Tell your caregiver if you drink alcohol several times a week. Talk with your caregiver about whether alcohol is safe for you. If your heart has already been damaged by alcohol or you have severe heart failure, drinking alcohol should be stopped completely.  · Stop illicit drug use.  · Stay up-to-date with immunizations. It is especially important to prevent respiratory infections through current pneumococcal and influenza immunizations.  · Manage other health conditions such as hypertension, diabetes, thyroid disease, or abnormal heart rhythms as directed by your caregiver.  · Learn to manage stress.  · Plan rest periods when fatigued.  · Learn strategies to manage high temperatures. If the weather is extremely hot:  ¨ Avoid vigorous physical activity.  ¨ Use air conditioning or fans or seek a cooler location.  ¨ Avoid caffeine and alcohol.  ¨ Wear loose-fitting, lightweight, and light-colored clothing.  · Learn strategies to manage cold temperatures. If the weather is extremely cold:  ¨ Avoid vigorous physical activity.  ¨ Layer clothes.  ¨ Wear mittens or gloves, a hat, and a scarf when going outside.  ¨ Avoid alcohol.  · Obtain ongoing education and support as needed.  · Participate or seek rehabilitation as needed to maintain or improve independence and quality of life.  SEEK MEDICAL CARE IF:   · Your weight increases by 03 lb/1.4 kg in 1 day or 05 lb/2.3 kg in a week.  · You have increasing shortness of breath that is unusual for you.  · You are unable to participate in your usual physical activities.  · You tire easily.  · You cough more than normal, especially with physical activity.  · You have any or more swelling in areas such as your hands, feet, ankles, or abdomen.  · You  are unable to sleep because it is hard to breathe.  · You feel like your heart is beating fast (palpitations).  · You become dizzy or lightheaded upon standing up.  SEEK IMMEDIATE MEDICAL CARE IF:   · You have difficulty breathing.  · There is a change in mental status such as decreased alertness or difficulty with concentration.  · You have a pain or discomfort in your chest.  · You have an episode of fainting (syncope).  MAKE SURE YOU:   · Understand these instructions.  · Will watch your condition.  · Will get help right away if you are not doing well or get worse.  Document Released: 09/27/2005 Document Revised: 01/22/2013   Document Reviewed: 10/19/2012  Exit[employer]® Patient Information ©2015 [employer]. This information is not intended to replace advice given to you by your health care provider. Make sure you discuss any questions you have with your health care provider.

## 2014-04-29 NOTE — Progress Notes (Signed)
Physical Therapy Treatment Patient Details Name: [name] MRN: [id number] DOB: 1946/06/21 Today's Date: 04/29/2014    History of Present Illness Pt is a 68 y/o male admitted s/p acute on chronic systolic and diastolic heart failure, NYHA class 4.     PT Comments    Pt progressing towards physical therapy goals. Was able to negotiate 5 steps with bilateral railings to simulate home environment. Pt anticipates home today, and also discussed general safety awareness with mobility into and around the home.   Follow Up Recommendations  Home health PT;Supervision for mobility/OOB     Equipment Recommendations  None recommended by PT    Recommendations for Other Services       Precautions / Restrictions Precautions Precautions: Fall Restrictions Weight Bearing Restrictions: No    Mobility  Bed Mobility               General bed mobility comments: Pt received sitting EOB conversing with family member present in room.   Transfers Overall transfer level: Needs assistance Equipment used: Rolling walker (2 wheeled) Transfers: Sit to/from Stand Sit to Stand: Supervision         General transfer comment: Pt demonstrated proper hand placement and safety awareness.   Ambulation/Gait Ambulation/Gait assistance: Min guard Ambulation Distance (Feet): 150 Feet Assistive device: Rolling walker (2 wheeled) Gait Pattern/deviations: Step-through pattern;Decreased stride length;Trunk flexed Gait velocity: Decreased Gait velocity interpretation: Below normal speed for age/gender General Gait Details: VC's for directing walker and avoiding obstacles in the hallway. Pt distracted by family member who was walking with [place].    Stairs Stairs: Yes Stairs assistance: Min guard Stair Management: Two rails;Step to pattern;Forwards Number of Stairs: 5 General stair comments: VC's for sequencing and safety awareness. Therapist with close guard and pt stating "turn me loose, I'm not  going to fall" - feel pt has a slightly decreased awareness of deficits and safety, as he required hands-on guarding and VC's for safety.   Wheelchair Mobility    Modified Rankin (Stroke Patients Only)       Balance Overall balance assessment: Needs assistance Sitting-balance support: Feet supported;No upper extremity supported Sitting balance-Leahy Scale: Fair     Standing balance support: Bilateral upper extremity supported;During functional activity Standing balance-Leahy Scale: Fair                      Cognition Arousal/Alertness: Awake/alert Behavior During Therapy: WFL for tasks assessed/performed Overall Cognitive Status: Within Functional Limits for tasks assessed                      Exercises      General Comments        Pertinent Vitals/Pain Vitals stable throughout session.     Home Living                      Prior Function            PT Goals (current goals can now be found in the care plan section) Acute Rehab PT Goals Patient Stated Goal: Home today PT Goal Formulation: With patient Time For Goal Achievement: 05/01/14 Potential to Achieve Goals: Good Progress towards PT goals: Progressing toward goals    Frequency  Min 3X/week    PT Plan Current plan remains appropriate    Co-evaluation             End of Session Equipment Utilized During Treatment: Gait belt Activity Tolerance: Patient limited by fatigue  Patient left: in bed;with family/visitor present;with call bell/phone within reach (Sitting EOB)     Time: 7841-2820 PT Time Calculation (min): 14 min  Charges:  $Gait Training: 8-22 mins                    G Codes:      Jolyn Lent May 14, 2014, 4:15 PM  Jolyn Lent, PT, DPT Acute Rehabilitation Services Pager: [phone]

## 2014-04-29 NOTE — Progress Notes (Signed)
DC IV, DC Tele, DC Home. Discharge instructions and home medications discussed with patient and patient's daughter. Patient and patient's daughter denied and questions or concerns at this time. Patient leaving unit via wheelchair and appears in no acute distress.

## 2014-05-08 ENCOUNTER — Other Ambulatory Visit: Payer: Self-pay | Admitting: Family Medicine

## 2014-05-08 DIAGNOSIS — N6310 Unspecified lump in the right breast, unspecified quadrant: Secondary | ICD-10-CM

## 2014-05-11 DIAGNOSIS — Z9289 Personal history of other medical treatment: Secondary | ICD-10-CM

## 2014-05-11 HISTORY — DX: Personal history of other medical treatment: Z92.89

## 2014-05-14 ENCOUNTER — Encounter: Payer: Self-pay | Admitting: Physician Assistant

## 2014-05-14 ENCOUNTER — Ambulatory Visit (INDEPENDENT_AMBULATORY_CARE_PROVIDER_SITE_OTHER): Payer: Medicare Other | Admitting: Physician Assistant

## 2014-05-14 VITALS — BP 110/60 | HR 67 | Ht 70.0 in | Wt 243.0 lb

## 2014-05-14 DIAGNOSIS — I6529 Occlusion and stenosis of unspecified carotid artery: Secondary | ICD-10-CM

## 2014-05-14 DIAGNOSIS — N183 Chronic kidney disease, stage 3 unspecified: Secondary | ICD-10-CM

## 2014-05-14 DIAGNOSIS — E785 Hyperlipidemia, unspecified: Secondary | ICD-10-CM

## 2014-05-14 DIAGNOSIS — I482 Chronic atrial fibrillation, unspecified: Secondary | ICD-10-CM

## 2014-05-14 DIAGNOSIS — I2510 Atherosclerotic heart disease of native coronary artery without angina pectoris: Secondary | ICD-10-CM

## 2014-05-14 DIAGNOSIS — I5033 Acute on chronic diastolic (congestive) heart failure: Secondary | ICD-10-CM

## 2014-05-14 DIAGNOSIS — I4891 Unspecified atrial fibrillation: Secondary | ICD-10-CM

## 2014-05-14 MED ORDER — POTASSIUM CHLORIDE ER 20 MEQ PO TBCR: 20.0000 meq | EXTENDED_RELEASE_TABLET | Freq: Every day | ORAL | Status: DC

## 2014-05-14 MED ORDER — METOLAZONE 5 MG PO TABS: ORAL_TABLET | ORAL | Status: DC

## 2014-05-14 NOTE — Patient Instructions (Signed)
START METOLAZONE 5 MG TAKE 30 MINUTES BEFORE YOUR MORNING DOSE OF LASIX ON WED, AND Saturday'S; YOU WILL ALSO ON THESE 2 DAYS YOU WILL NEED TO TAKE EXTRA POTASSIUM  LAB WORK TODAY; BMET  REPEAT BMET IN 1 WEEK  YOU NEED A FOLLOW UP WITH THE CHF CLINIC IN THE NEXT 3-4 WEEKS  Your physician recommends that you schedule a follow-up appointment in: [place], PAC SAME DAY DR. Angelena Form IS IN THE OFFICE

## 2014-05-14 NOTE — Progress Notes (Signed)
Cardiology Office Note    Date:  05/14/2014   ID:  [name], DOB 18-Aug-1946, MRN [id number]  PCP:  Jene Every, MD  Cardiologist:  Dr. Lauree Chandler   CHF:  Dr. Loralie Champagne    History of Present Illness: [name] is a 68 y.o. male with a history of CAD, status post CABG in 2001, atrial fibrillation, COPD, sleep apnea, CKD stage III, diastolic CHF, carotid stenosis s/p L CEA. Patient is not a Coumadin candidate secondary to history of alcohol abuse.  He was admitted 7/11-7/20 with acute on chronic diastolic CHF in the setting of acute on chronic oliguric renal failure. The patient was placed on IV Lasix with worsening creatinine and no increase in urine output. He was felt to have cardiorenal syndrome. He was seen by nephrology. Temporary dialysis catheter was placed. He underwent hemodialysis. Creatinine improved back to baseline. He diuresed a total of 12.762 L. He was placed back on oral Lasix dialysis catheter was removed.  He returns for follow up.  He sees nephrology for the first time later this week.  He does note increased weight since discharge. He continues to note normal urine output. LE edema has worsened. He has chronic dyspnea. There has been no significant change. He is NYHA 2b-3.  He denies chest pain. He denies syncope. He does note fatigue. Unfortunately, he is drinking alcohol again. He had 4 airplane bottles of liquor last night.   Studies:  - LHC (11/01):  3 vessel CAD  >>> CABG  - Echo (7/15):  EF 50-55%, MAC, moderate to severe LAE, moderate RVH, moderate RAE  - Nuclear (9/14):  Inferolateral, no ischemia, not gated, probable low risk scan >>> med rx  - Carotid US (5/15):  Bilateral ICA 60-79%, >50% RECA- f/u 14-months   Recent Labs/Images: 04/20/2014: ALT <5; Pro B Natriuretic peptide (BNP) 17870.0*  04/29/2014: Creatinine 1.62*; Hemoglobin 12.2*; Potassium 3.7   Dg Chest 2 View  04/20/2014     IMPRESSION: Cardiomegaly without acute disease.    Electronically Signed   By: Inge Rise M.D.   On: 04/20/2014 17:24     Wt Readings from Last 3 Encounters:  05/14/14 243 lb (110.224 kg)  04/29/14 230 lb 6.1 oz (104.5 kg)  02/19/14 241 lb (109.317 kg)     Past Medical History  Diagnosis Date  . CHRONIC OBSTRUCTIVE PULMONARY DISEASE 06/20/2009  . OBSTRUCTIVE SLEEP APNEA 06/20/2009  . CAROTID STENOSIS 06/20/2009    A. 08/2001 s/p L CEA;  B.   09/14/11 - Carotid U/S - 40-59% bilateral stenosis, left CEA patch angioplasty is patent  . DM 06/20/2009  . CAD 06/20/2009    A.  08/2000 - s/p CABG x 4 - LIMA-LAD, Left Radial-OM, VG-DIAG, VG-RCA;  B. Neg. MV  2010  . HYPERLIPIDEMIA 06/20/2009  . HYPERTENSION 06/20/2009  . Hypothyroidism   . Low back pain   . Asthma     as child  . Pneumonia   . Atrial fibrillation     Not felt to be coumadin candidate 2/2 ETOH use.  Marland Kitchen ETOH abuse   . History of tobacco abuse     remote - quit 1970  . Bilateral renal cysts   . Marijuana abuse   . Morbidly obese   . CHF (congestive heart failure)   . Falls frequently     Current Outpatient Prescriptions  Medication Sig Dispense Refill  . aspirin 81 MG chewable tablet Chew 1 tablet (81 mg total) by mouth daily.  30 tablet  1  . atorvastatin (LIPITOR) 20 MG tablet Take 20 mg by mouth daily.      . carvedilol (COREG) 3.125 MG tablet Take 1 tablet (3.125 mg total) by mouth 2 (two) times daily with a meal.  60 tablet  5  . colchicine 0.6 MG tablet Take 0.6 mg by mouth daily as needed (for gout).      Marland Kitchen FLUoxetine (PROZAC) 20 MG capsule Take 20 mg by mouth daily.        . fluticasone (FLOVENT HFA) 110 MCG/ACT inhaler Inhale 1 puff into the lungs 2 (two) times daily.      . furosemide (LASIX) 80 MG tablet Take 2 tablets (160 mg total) by mouth 2 (two) times daily.  120 tablet  5  . gabapentin (NEURONTIN) 300 MG capsule Take 1 capsule (300 mg total) by mouth daily.      Marland Kitchen glucose blood (BAYER CONTOUR TEST) test strip USE TO TEST BLOOD SUGAR TWICE DAILY        . hydrocortisone (ANUSOL-HC) 2.5 % rectal cream Place rectally 3 (three) times daily as needed for hemorrhoids or itching.  30 g  0  . Insulin Glargine (LANTUS SOLOSTAR) 100 UNIT/ML Solostar Pen Inject 16 Units into the skin at bedtime.       Marland Kitchen lactulose (CHRONULAC) 10 GM/15ML solution Take 20 g by mouth 2 (two) times daily.      Marland Kitchen levothyroxine (SYNTHROID, LEVOTHROID) 150 MCG tablet Take 150 mcg by mouth at bedtime.       . Multiple Vitamin (MULTIVITAMIN WITH MINERALS) TABS tablet Take 1 tablet by mouth daily.      Marland Kitchen omeprazole (PRILOSEC) 20 MG capsule Take 20 mg by mouth daily.        . Oxycodone HCl 10 MG TABS Take 10 mg by mouth every 8 (eight) hours as needed (for pain).      . potassium chloride 20 MEQ TBCR Take 40 mEq by mouth daily.  30 tablet  5  . senna (SENOKOT) 8.6 MG TABS tablet Take 1 tablet by mouth 2 (two) times daily as needed for mild constipation.       No current facility-administered medications for this visit.     Allergies:   Erythromycin   Social History:  The patient  reports that he quit smoking about 45 years ago. He does not have any smokeless tobacco history on file. He reports that he drinks alcohol. He reports that he uses illicit drugs (Marijuana).   Family History:  The patient's family history includes Stroke in his father and mother.   ROS:  Please see the history of present illness.   + cough with white sputum - no hemoptysis   All other systems reviewed and negative.   PHYSICAL EXAM: VS:  BP 110/60  Pulse 67  Ht 5\' 10"  (1.778 m)  Wt 243 lb (110.224 kg)  BMI 34.87 kg/m2 Well nourished, well developed, in no acute distress HEENT: normal Neck: no JVD90 degrees Cardiac:  normal S1, S2; irreg irreg rhythm; no murmur Lungs:  Decreased breath sounds bilaterally, no wheezing, rhonchi or rales Abd: soft, nontender, no hepatomegaly Ext: tight brawny 1-2+ bilateral LE edemaL>R Skin: warm and dry Neuro:  CNs 2-12 intact, no focal abnormalities  noted  EKG:  AFib, HR 67, RAD, NSSTTW changes     ASSESSMENT AND PLAN:  Acute on chronic diastolic heart failure:  He is developing volume excess again. Continue current dose of Lasix. Add metolazone 5 mg twice  a week. Check a basic metabolic panel today and again in 1 week. He will take an extra potassium on the days that he takes his metolazone. I will arrange followup in the congestive heart failure clinic.  CKD (chronic kidney disease), stage 3 (moderate):  Follow up with nephrology later this week as noted.  CAD:  No angina. Continue aspirin, statin, beta blocker.  Chronic atrial fibrillation:  Rate is controlled. He is not a candidate for anticoagulation.  CAROTID STENOSIS:  Follow up carotid US 08/2014.  Hypderlipidemia:  Continue statin.   Disposition:  Follow up with me or Dr. Lauree Chandler 1 week.   Signed, Versie Starks, MHS 05/14/2014 2:45 PM    [facility] [address], [place], [place]  [address] Phone: [phone]; Fax: [phone]

## 2014-05-15 LAB — BASIC METABOLIC PANEL
BUN: 40 mg/dL — ABNORMAL HIGH (ref 6–23)
CALCIUM: 9.1 mg/dL (ref 8.4–10.5)
CHLORIDE: 96 meq/L (ref 96–112)
CO2: 33 meq/L — AB (ref 19–32)
CREATININE: 1.1 mg/dL (ref 0.4–1.5)
GFR: 70.25 mL/min (ref >60.00)
Glucose, Bld: 102 mg/dL — ABNORMAL HIGH (ref 70–99)
Potassium: 4.1 mEq/L (ref 3.5–5.1)
SODIUM: 137 meq/L (ref 135–145)

## 2014-05-16 ENCOUNTER — Other Ambulatory Visit: Payer: Self-pay | Admitting: Family Medicine

## 2014-05-16 ENCOUNTER — Ambulatory Visit
Admission: RE | Admit: 2014-05-16 | Discharge: 2014-05-16 | Disposition: A | Discharge status: Home / Self Care | Payer: Medicare Other | Source: Ambulatory Visit | Attending: Family Medicine | Admitting: Family Medicine

## 2014-05-16 DIAGNOSIS — N6310 Unspecified lump in the right breast, unspecified quadrant: Secondary | ICD-10-CM

## 2014-05-22 ENCOUNTER — Other Ambulatory Visit: Payer: Medicare Other

## 2014-05-22 ENCOUNTER — Telehealth: Payer: Self-pay | Admitting: *Deleted

## 2014-05-22 ENCOUNTER — Ambulatory Visit (INDEPENDENT_AMBULATORY_CARE_PROVIDER_SITE_OTHER): Payer: Medicare Other | Admitting: Physician Assistant

## 2014-05-22 ENCOUNTER — Encounter: Payer: Self-pay | Admitting: Physician Assistant

## 2014-05-22 VITALS — BP 106/68 | HR 63 | Ht 70.0 in | Wt 234.0 lb

## 2014-05-22 DIAGNOSIS — I5032 Chronic diastolic (congestive) heart failure: Secondary | ICD-10-CM

## 2014-05-22 DIAGNOSIS — I5033 Acute on chronic diastolic (congestive) heart failure: Secondary | ICD-10-CM

## 2014-05-22 DIAGNOSIS — E785 Hyperlipidemia, unspecified: Secondary | ICD-10-CM

## 2014-05-22 DIAGNOSIS — I482 Chronic atrial fibrillation, unspecified: Secondary | ICD-10-CM

## 2014-05-22 DIAGNOSIS — N183 Chronic kidney disease, stage 3 unspecified: Secondary | ICD-10-CM

## 2014-05-22 DIAGNOSIS — I4891 Unspecified atrial fibrillation: Secondary | ICD-10-CM

## 2014-05-22 DIAGNOSIS — I2510 Atherosclerotic heart disease of native coronary artery without angina pectoris: Secondary | ICD-10-CM

## 2014-05-22 DIAGNOSIS — Z01810 Encounter for preprocedural cardiovascular examination: Secondary | ICD-10-CM

## 2014-05-22 LAB — BASIC METABOLIC PANEL
BUN: 63 mg/dL — AB (ref 6–23)
CO2: 34 mEq/L — ABNORMAL HIGH (ref 19–32)
Calcium: 9.4 mg/dL (ref 8.4–10.5)
Chloride: 90 mEq/L — ABNORMAL LOW (ref 96–112)
Creatinine, Ser: 1.5 mg/dL (ref 0.4–1.5)
GFR: 48.74 mL/min — AB (ref >60.00)
Glucose, Bld: 115 mg/dL — ABNORMAL HIGH (ref 70–99)
Potassium: 3.5 mEq/L (ref 3.5–5.1)
SODIUM: 135 meq/L (ref 135–145)

## 2014-05-22 NOTE — Telephone Encounter (Signed)
s/w pt's daughter [name] who has been made aware of results and instructions to have pt hold 2 doses of metolazone then take 1 x weekly, bmet 10 days. [name] asked if lab work could be same day pt see's surgeon 8/19. said ok will let SW. know of lab date.

## 2014-05-22 NOTE — Telephone Encounter (Signed)
Message copied by Michae Kava on Wed May 22, 2014  5:23 PM ------      Message from: [place], [place] T      Created: Wed May 22, 2014  5:21 PM       BUN and creatinine increasing       Hold metolazone x 2 doses      Then resume at 1 x per week      Repeat BMET in 10 days      Richardson Dopp, PA-C        05/22/2014 5:21 PM ------

## 2014-05-22 NOTE — Patient Instructions (Signed)
Your physician recommends that you schedule a follow-up appointment ON 08/29/14 2:15 WITH DR. Angelena Form  NO CHANGES WERE MADE TODAY

## 2014-05-22 NOTE — Progress Notes (Signed)
Cardiology Office Note    Date:  05/22/2014   ID:  [name], DOB 24-Apr-1946, MRN [id number]  PCP:  Jene Every, MD  Cardiologist:  Dr. Lauree Chandler   CHF:  Dr. Loralie Champagne    History of Present Illness: [name] is a 67 y.o. male with a history of CAD, status post CABG in 2001, atrial fibrillation, COPD, sleep apnea, CKD stage III, diastolic CHF, carotid stenosis s/p L CEA. Patient is not a Coumadin candidate secondary to history of alcohol abuse.  He was admitted 7/11-7/20 with acute on chronic diastolic CHF in the setting of acute on chronic oliguric renal failure. The patient was placed on IV Lasix with worsening creatinine and no increase in urine output. He was felt to have cardiorenal syndrome. He was seen by nephrology. Temporary dialysis catheter was placed. He underwent hemodialysis. Creatinine improved back to baseline. He diuresed a total of 12.762 L. He was placed back on oral Lasix dialysis catheter was removed.  I saw him 8/4. He was volume overloaded again. I added metolazone 5 mg twice a week. He returns for followup. He is feeling much better. His breathing is improved. His weight is down. He is NYHA 2b.  He denies orthopnea or PND. LE edema is improved. He denies syncope. He ambulates with a walker. Unfortunately, he has a breast mass on the right that is positive for malignancy based on recent biopsy results. He also has a lung nodule. He sees general surgery next week.  Studies:  - LHC (11/01):  3 vessel CAD  >>> CABG  - Echo (7/15):  EF 50-55%, MAC, moderate to severe LAE, moderate RVH, moderate RAE  - Nuclear (9/14):  Inferolateral, no ischemia, not gated, probable low risk scan >>> med rx  - Carotid US (5/15):  Bilateral ICA 60-79%, >50% RECA- f/u 72-months   Recent Labs/Images: 04/20/2014: ALT <5; Pro B Natriuretic peptide (BNP) 17870.0*  04/29/2014: Hemoglobin 12.2*  05/14/2014: Creatinine 1.1; Potassium 4.1    Wt Readings from Last 3  Encounters:  05/22/14 234 lb (106.142 kg)  05/14/14 243 lb (110.224 kg)  04/29/14 230 lb 6.1 oz (104.5 kg)     Past Medical History  Diagnosis Date  . CHRONIC OBSTRUCTIVE PULMONARY DISEASE 06/20/2009  . OBSTRUCTIVE SLEEP APNEA 06/20/2009  . CAROTID STENOSIS 06/20/2009    A. 08/2001 s/p L CEA;  B.   09/14/11 - Carotid U/S - 40-59% bilateral stenosis, left CEA patch angioplasty is patent  . DM 06/20/2009  . CAD 06/20/2009    A.  08/2000 - s/p CABG x 4 - LIMA-LAD, Left Radial-OM, VG-DIAG, VG-RCA;  B. Neg. MV  2010  . HYPERLIPIDEMIA 06/20/2009  . HYPERTENSION 06/20/2009  . Hypothyroidism   . Low back pain   . Asthma     as child  . Pneumonia   . Atrial fibrillation     Not felt to be coumadin candidate 2/2 ETOH use.  Marland Kitchen ETOH abuse   . History of tobacco abuse     remote - quit 1970  . Bilateral renal cysts   . Marijuana abuse   . Morbidly obese   . CHF (congestive heart failure)   . Falls frequently     Current Outpatient Prescriptions  Medication Sig Dispense Refill  . aspirin 81 MG chewable tablet Chew 1 tablet (81 mg total) by mouth daily.  30 tablet  1  . atorvastatin (LIPITOR) 20 MG tablet Take 20 mg by mouth daily.      Marland Kitchen  carvedilol (COREG) 3.125 MG tablet Take 1 tablet (3.125 mg total) by mouth 2 (two) times daily with a meal.  60 tablet  5  . colchicine 0.6 MG tablet Take 0.6 mg by mouth daily as needed (for gout).      Marland Kitchen FLUoxetine (PROZAC) 20 MG capsule Take 20 mg by mouth daily.        . fluticasone (FLOVENT HFA) 110 MCG/ACT inhaler Inhale 1 puff into the lungs 2 (two) times daily.      . furosemide (LASIX) 80 MG tablet Take 2 tablets (160 mg total) by mouth 2 (two) times daily.  120 tablet  5  . gabapentin (NEURONTIN) 300 MG capsule Take 1 capsule (300 mg total) by mouth daily.      Marland Kitchen glucose blood (BAYER CONTOUR TEST) test strip 1 each by Other route as needed. USE TO TEST BLOOD SUGAR TWICE DAILY      . hydrocortisone (ANUSOL-HC) 2.5 % rectal cream Place rectally 3  (three) times daily as needed for hemorrhoids or itching.  30 g  0  . Insulin Glargine (LANTUS SOLOSTAR) 100 UNIT/ML Solostar Pen Inject 16 Units into the skin at bedtime.       Marland Kitchen lactulose (CHRONULAC) 10 GM/15ML solution Take 20 g by mouth 2 (two) times daily.      Marland Kitchen levothyroxine (SYNTHROID, LEVOTHROID) 150 MCG tablet Take 150 mcg by mouth at bedtime.       . metolazone (ZAROXOLYN) 5 MG tablet TAKE 5 MG 30 MINUTES BEFORE MORNING LASIX ON WED AND Saturday'S ; MAKE SURE TO TAKE EXTRA POTASSIUM ON THOSE 2 DAYS  30 tablet  3  . Multiple Vitamin (MULTIVITAMIN WITH MINERALS) TABS tablet Take 1 tablet by mouth daily.      Marland Kitchen omeprazole (PRILOSEC) 20 MG capsule Take 20 mg by mouth daily.        . Oxycodone HCl 10 MG TABS Take 10 mg by mouth every 8 (eight) hours as needed (for pain).      . Potassium Chloride ER 20 MEQ TBCR Take 20 mEq by mouth daily. 1 TABLET DAILY, EXCEPT ON WED AND Saturday's TAKE EXTRA TAB ON METOLAZONE DAYS  45 tablet  11  . senna (SENOKOT) 8.6 MG TABS tablet Take 1 tablet by mouth 2 (two) times daily as needed for mild constipation.       No current facility-administered medications for this visit.     Allergies:   Erythromycin   Social History:  The patient  reports that he quit smoking about 45 years ago. He does not have any smokeless tobacco history on file. He reports that he drinks alcohol. He reports that he uses illicit drugs (Marijuana).   Family History:  The patient's family history includes Stroke in his father and mother. There is no history of Heart attack.   ROS:  Please see the history of present illness.  All other systems reviewed and negative.   PHYSICAL EXAM: VS:  BP 106/68  Pulse 63  Ht 5\' 10"  (1.778 m)  Wt 234 lb (106.142 kg)  BMI 33.58 kg/m2 Well nourished, well developed, in no acute distress HEENT: normal Neck: no JVD90 degrees Cardiac:  normal S1, S2; irreg irreg rhythm; no murmur Lungs:  Decreased breath sounds bilaterally, no wheezing,  rhonchi or rales Abd: soft, nontender, no hepatomegaly Ext: 1+ L and trace R LE edema Skin: warm and dry Neuro:  CNs 2-12 intact, no focal abnormalities noted  EKG:  AFib, HR 63, RAD, NSSTTW changes  ASSESSMENT AND PLAN:  Chronic diastolic heart failure:  Volume is improved. Check followup basic metabolic panel today. Adjust diuretics further if renal function deteriorating. Followup with CHF clinic as planned.  CKD (chronic kidney disease), stage 3 (moderate):  Follow up with nephrology As directed.  Recent creatinine was normal.  CAD:  No angina. Continue aspirin, statin, beta blocker.  If his breast mass requires surgery utilizing conscious sedation and local anesthetics, he will not require any further cardiac workup. If he requires general anesthesia or chest surgery for his lung nodule, he would need a nuclear stress test prior to surgery for risk stratification.  Chronic atrial fibrillation:  Rate is controlled. He is not a candidate for anticoagulation.  CAROTID STENOSIS:  Follow up carotid US 08/2014.  Hypderlipidemia:  Continue statin.   Disposition:  Follow up with Dr. Lauree Chandler  3 mos.   Signed, Versie Starks, MHS 05/22/2014 11:22 AM    [facility] [address], [place], [place]  [address] Phone: [phone]; Fax: [phone]

## 2014-05-29 ENCOUNTER — Other Ambulatory Visit: Payer: Self-pay | Admitting: *Deleted

## 2014-05-29 ENCOUNTER — Ambulatory Visit (INDEPENDENT_AMBULATORY_CARE_PROVIDER_SITE_OTHER): Payer: Medicare Other | Admitting: General Surgery

## 2014-05-29 ENCOUNTER — Other Ambulatory Visit (INDEPENDENT_AMBULATORY_CARE_PROVIDER_SITE_OTHER): Payer: Medicare Other

## 2014-05-29 ENCOUNTER — Encounter (INDEPENDENT_AMBULATORY_CARE_PROVIDER_SITE_OTHER): Payer: Self-pay

## 2014-05-29 VITALS — BP 128/72 | HR 76 | Temp 97.5°F | Ht 67.0 in | Wt 239.0 lb

## 2014-05-29 DIAGNOSIS — C50929 Malignant neoplasm of unspecified site of unspecified male breast: Secondary | ICD-10-CM

## 2014-05-29 DIAGNOSIS — I5033 Acute on chronic diastolic (congestive) heart failure: Secondary | ICD-10-CM

## 2014-05-29 DIAGNOSIS — Z01810 Encounter for preprocedural cardiovascular examination: Secondary | ICD-10-CM

## 2014-05-29 DIAGNOSIS — C50921 Malignant neoplasm of unspecified site of right male breast: Secondary | ICD-10-CM | POA: Insufficient documentation

## 2014-05-29 LAB — BASIC METABOLIC PANEL
BUN: 73 mg/dL — ABNORMAL HIGH (ref 6–23)
CHLORIDE: 85 meq/L — AB (ref 96–112)
CO2: 37 meq/L — AB (ref 19–32)
Calcium: 9.7 mg/dL (ref 8.4–10.5)
Creatinine, Ser: 1.4 mg/dL (ref 0.4–1.5)
GFR: 55.46 mL/min — ABNORMAL LOW (ref >60.00)
GLUCOSE: 159 mg/dL — AB (ref 70–99)
Potassium: 3.6 mEq/L (ref 3.5–5.1)
SODIUM: 135 meq/L (ref 135–145)

## 2014-05-29 NOTE — Patient Instructions (Signed)
We have recommended that you have a right mastectomy with axillary lymph node biopsy.  We will get clearance from your cardiologist before scheduling surgery.

## 2014-05-29 NOTE — Progress Notes (Signed)
Subjective:   New diagnosis cancer right breast  Patient ID: [name], male   DOB: 1946/03/08, 68 y.o.   MRN: [id number]  HPI Patient is a 68 year old male Multiple chronic medical problems Referred by Dr. Lovey Newcomer at the breast center for a new diagnosis of right breast cancer. The patient underwent a CT scan of the chest on July 9 due to an episode of hemoptysis and shortness of breath. As far as his lungs this showed an indeterminate small 8 x 5 mm nodular opacity in the posterior right lower lobe. CT followup in approximately 6 months was recommended for this indeterminate lesion. However also seen was a nodular density in the right breast and further workup with mammogram and ultrasound was recommended. This was performed at the breast center. Mammogram and ultrasound revealed a 1.2 cm irregular mass in the right breast. Ultrasound-guided core biopsy was performed. 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  He now presents with his daughter today to discuss further treatment. He has not noted any changes in his breast.  Past Medical History  Diagnosis Date  . CHRONIC OBSTRUCTIVE PULMONARY DISEASE 06/20/2009  . OBSTRUCTIVE SLEEP APNEA 06/20/2009  . CAROTID STENOSIS 06/20/2009    A. 08/2001 s/p L CEA;  B.   09/14/11 - Carotid U/S - 40-59% bilateral stenosis, left CEA patch angioplasty is patent  . DM 06/20/2009  . CAD 06/20/2009    A.  08/2000 - s/p CABG x 4 - LIMA-LAD, Left Radial-OM, VG-DIAG, VG-RCA;  B. Neg. MV  2010  . HYPERLIPIDEMIA 06/20/2009  . HYPERTENSION 06/20/2009  . Hypothyroidism   . Low back pain   . Asthma     as child  . Pneumonia   . Atrial fibrillation     Not felt to be coumadin candidate 2/2 ETOH use.  Marland Kitchen ETOH abuse   . History of tobacco abuse     remote - quit 1970  . Bilateral renal cysts   . Marijuana abuse   . Morbidly obese   . CHF (congestive heart failure)   . Falls frequently    Past Surgical History   Procedure Laterality Date  . Carotid endarterectomy  2002    left  . Coronary artery bypass graft      x 4 - 2001  . Umbilical hernia repair N/A 01/28/2014    Procedure: HERNIA REPAIR UMBILICAL ADULT/INC;  Surgeon: Harl Bowie, MD;  Location: [facility];  Service: General;  Laterality: N/A;  . Laparotomy N/A 01/28/2014    Procedure: EXPLORATORY LAPAROTOMY;  Surgeon: Harl Bowie, MD;  Location: [facility];  Service: General;  Laterality: N/A;  . Bowel resection N/A 01/28/2014    Procedure: SMALL BOWEL RESECTION;  Surgeon: Harl Bowie, MD;  Location: [facility];  Service: General;  Laterality: N/A;  . Hernia repair     Current Outpatient Prescriptions  Medication Sig Dispense Refill  . aspirin 81 MG chewable tablet Chew 1 tablet (81 mg total) by mouth daily.  30 tablet  1  . atorvastatin (LIPITOR) 20 MG tablet Take 20 mg by mouth daily.      . carvedilol (COREG) 3.125 MG tablet Take 1 tablet (3.125 mg total) by mouth 2 (two) times daily with a meal.  60 tablet  5  . colchicine 0.6 MG tablet Take 0.6 mg by mouth daily as needed (for gout).      Marland Kitchen FLUoxetine (PROZAC) 20 MG capsule Take 20 mg  by mouth daily.        . fluticasone (FLOVENT HFA) 110 MCG/ACT inhaler Inhale 1 puff into the lungs 2 (two) times daily.      . furosemide (LASIX) 80 MG tablet Take 2 tablets (160 mg total) by mouth 2 (two) times daily.  120 tablet  5  . gabapentin (NEURONTIN) 300 MG capsule Take 1 capsule (300 mg total) by mouth daily.      Marland Kitchen glucose blood (BAYER CONTOUR TEST) test strip 1 each by Other route as needed. USE TO TEST BLOOD SUGAR TWICE DAILY      . hydrocortisone (ANUSOL-HC) 2.5 % rectal cream Place rectally 3 (three) times daily as needed for hemorrhoids or itching.  30 g  0  . Insulin Glargine (LANTUS SOLOSTAR) 100 UNIT/ML Solostar Pen Inject 16 Units into the skin at bedtime.       Marland Kitchen lactulose (CHRONULAC) 10 GM/15ML solution Take 20 g by mouth 2 (two) times daily.      Marland Kitchen levothyroxine (SYNTHROID,  LEVOTHROID) 150 MCG tablet Take 150 mcg by mouth at bedtime.       . metolazone (ZAROXOLYN) 5 MG tablet TAKE 5 MG 30 MINUTES BEFORE MORNING LASIX ON WED AND Saturday'S ; MAKE SURE TO TAKE EXTRA POTASSIUM ON THOSE 2 DAYS  30 tablet  3  . Multiple Vitamin (MULTIVITAMIN WITH MINERALS) TABS tablet Take 1 tablet by mouth daily.      Marland Kitchen omeprazole (PRILOSEC) 20 MG capsule Take 20 mg by mouth daily.        . Oxycodone HCl 10 MG TABS Take 10 mg by mouth every 8 (eight) hours as needed (for pain).      . Potassium Chloride ER 20 MEQ TBCR Take 20 mEq by mouth daily. 1 TABLET DAILY, EXCEPT ON WED AND Saturday's TAKE EXTRA TAB ON METOLAZONE DAYS  45 tablet  11  . senna (SENOKOT) 8.6 MG TABS tablet Take 1 tablet by mouth 2 (two) times daily as needed for mild constipation.       No current facility-administered medications for this visit.   Allergies  Allergen Reactions  . Erythromycin Hives     Review of Systems General: Positive for some fatigue Respiratory: Denies current shortness of breath or cough or hemoptysis Cardiac: Denies current chest pain or palpitations. He has chronic lower extremity edema Abdomen: Occasional constipation related to pain medication Extremities: Chronic joint pain and arthritis and difficulty with mobility. Chronic lower extremity swelling. Neurologic: He has dizziness and history of falls and uses a walker.  Numbness of hands and feet secondary to neuropathy    Objective:   Physical Exam BP 128/72  Pulse 76  Temp(Src) 97.5 F (36.4 C)  Ht 5' 7"  (1.702 m)  Wt 239 lb (108.41 kg)  BMI 37.42 kg/m2 General: Alert, Chronically ill-appearing, obese elderly Caucasian male, in no distress Skin: Warm and dry without rash or infection. HEENT: No palpable masses or thyromegaly. Sclera nonicteric. Pupils equal round and reactive. Oropharynx clear. Lymph nodes: No cervical, supraclavicular, or inguinal nodes palpable. Breasts: There is an approximately 2 cm mass in the  central right breast. Breasts are ptotic. No palpable axillary adenopathy. Lungs: Breath sounds clear, Decreased at bases, and equal without increased work of breathing Cardiovascular: Healed sternotomy. Regular rate and rhythm without murmur. There is a 2+ lower extremity edema bilaterally with some chronic venous stasis changes. Abdomen: Nondistended. Obese colon Soft and nontender. No masses palpable. No organomegaly. No palpable hernias. Extremities: 2-3+ lower extremity edema and  chronic venous stasis changes. Neurologic: Alert and fully oriented. 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 We reviewed the diagnosis and treatment options today with the patient and his daughter. He has a small indeterminant right pulmonary nodule and a CT scan that is highly likely to be unrelated to his breast cancer and I think would be reasonable to followup in 6 months with repeat CT is recommended by guidelines. Standard treatment would be right total mastectomy with sentinel lymph node biopsy and likely followup hormonal therapy. He would be at increased risk for surgery due to her multiple medical problems but hopefully this would be acceptable. I recommend genetic testing due to his implications for his daughter. We will make this referral to the cancer center.We will need to obtain cardiac clearance and his daughter stated that his cardiologist had mentioned a stress test if surgery General anesthesia was planned. We discussed the nature of the surgery and recovery and risks of cardiorespiratory complications, bleeding, infection and small risk of lymphedema. There were given literature regarding breast cancer treatment and surgery. All her questions were answered..     Plan:     Genetic testing. Preoperative cardiac clearance. Likely then proceed with right total mastectomy with sentinel lymph node biopsy under general  anesthesia as an outpatient.

## 2014-05-29 NOTE — Addendum Note (Signed)
Addended by: Ivor Costa on: 05/29/2014 11:07 AM   Modules accepted: Orders

## 2014-05-30 ENCOUNTER — Ambulatory Visit: Payer: Medicare Other | Admitting: Cardiovascular Disease

## 2014-05-30 ENCOUNTER — Ambulatory Visit (HOSPITAL_COMMUNITY): Payer: Medicare Other | Attending: Cardiovascular Disease | Admitting: Radiology

## 2014-05-30 VITALS — BP 143/69 | HR 67 | Ht 70.0 in | Wt 240.0 lb

## 2014-05-30 DIAGNOSIS — R079 Chest pain, unspecified: Secondary | ICD-10-CM

## 2014-05-30 DIAGNOSIS — I4891 Unspecified atrial fibrillation: Secondary | ICD-10-CM | POA: Insufficient documentation

## 2014-05-30 DIAGNOSIS — J449 Chronic obstructive pulmonary disease, unspecified: Secondary | ICD-10-CM | POA: Insufficient documentation

## 2014-05-30 DIAGNOSIS — I2510 Atherosclerotic heart disease of native coronary artery without angina pectoris: Secondary | ICD-10-CM

## 2014-05-30 DIAGNOSIS — I10 Essential (primary) hypertension: Secondary | ICD-10-CM | POA: Diagnosis not present

## 2014-05-30 DIAGNOSIS — E119 Type 2 diabetes mellitus without complications: Secondary | ICD-10-CM | POA: Diagnosis not present

## 2014-05-30 DIAGNOSIS — J4489 Other specified chronic obstructive pulmonary disease: Secondary | ICD-10-CM | POA: Insufficient documentation

## 2014-05-30 DIAGNOSIS — Z01810 Encounter for preprocedural cardiovascular examination: Secondary | ICD-10-CM

## 2014-05-30 DIAGNOSIS — I509 Heart failure, unspecified: Secondary | ICD-10-CM | POA: Insufficient documentation

## 2014-05-30 DIAGNOSIS — R0602 Shortness of breath: Secondary | ICD-10-CM | POA: Insufficient documentation

## 2014-05-30 MED ORDER — REGADENOSON 0.4 MG/5ML IV SOLN
0.4000 mg | Freq: Once | INTRAVENOUS | Status: AC
  Administered 2014-05-30: 0.4 mg via INTRAVENOUS

## 2014-05-30 MED ORDER — TECHNETIUM TC 99M SESTAMIBI GENERIC - CARDIOLITE
11.0000 | Freq: Once | INTRAVENOUS | Status: AC | PRN
  Administered 2014-05-30: 11 via INTRAVENOUS

## 2014-05-30 MED ORDER — TECHNETIUM TC 99M SESTAMIBI GENERIC - CARDIOLITE
33.0000 | Freq: Once | INTRAVENOUS | Status: AC | PRN
  Administered 2014-05-30: 33 via INTRAVENOUS

## 2014-05-30 NOTE — Progress Notes (Signed)
[place] SITE 3 NUCLEAR MED [place], [place] [address] [phone]    Cardiology Nuclear Med Study  RAMIN ZOLL is a 68 y.o. male     MRN : [id number]     DOB: [birth year]  Procedure Date: 05/30/2014  Nuclear Med Background Indication for Stress Test:  Evaluation for Ischemia, Surgical Clearance-(R Breast Cancer-Dr. Excell Seltzer, MD) and Graft Patency History:  CAD, Afib, CHF, MPI 2014 (scar) not gated, Asthma, COPD Cardiac Risk Factors: Carotid Disease, Hypertension and IDDM Type 2  Symptoms:  SOB   Nuclear Pre-Procedure Caffeine/Decaff Intake:  None NPO After: 7:00pm   Lungs:  clear O2 Sat: 86% on room air. IV 0.9% NS with Angio Cath:  24g  IV Site: R Hand  IV Started by:  Crissie Figures, RN  Chest Size (in):  50 Cup Size: n/a  Height: 5\' 10"  (1.778 m)  Weight:  240 lb (108.863 kg)  BMI:  Body mass index is 34.44 kg/(m^2). Tech Comments:  N/A    Nuclear Med Study 1 or 2 day study: 1 day  Stress Test Type:  Lexiscan  Reading MD: N/A  Order Authorizing Provider:  Lauree Chandler, MD  Resting Radionuclide: Technetium 42m Sestamibi  Resting Radionuclide Dose: 11.0 mCi   Stress Radionuclide:  Technetium 39m Sestamibi  Stress Radionuclide Dose: 33.0 mCi           Stress Protocol Rest HR: 67 Stress HR: 72  Rest BP: 143/69 Stress BP: 120/50  Exercise Time (min): n/a METS: n/a           Dose of Adenosine (mg):  n/a Dose of Lexiscan: 0.4 mg  Dose of Atropine (mg): n/a Dose of Dobutamine: n/a mcg/kg/min (at max HR)  Stress Test Technologist: Glade Lloyd, BS-ES  Nuclear Technologist:  Vedia Pereyra, CNMT     Rest Procedure:  Myocardial perfusion imaging was performed at rest 45 minutes following the intravenous administration of Technetium 50m Sestamibi. Rest ECG: NSR - Normal EKG  Stress Procedure:  The patient received IV Lexiscan 0.4 mg over 15-seconds.  Technetium 53m Sestamibi injected at 30-seconds.  Quantitative spect  images were obtained after a 45 minute delay. Stress ECG: No significant change from baseline ECG  QPS Raw Data Images:  Soft tissue (diaphragm) underlies heart.   Stress Images:  Normal homogeneous uptake in all areas of the myocardium. Rest Images:  Normal homogeneous uptake in all areas of the myocardium. Subtraction (SDS):  No evidence of ischemia. Transient Ischemic Dilatation (Normal <1.22):  1.34 Lung/Heart Ratio (Normal <0.45):  0.35  Quantitative Gated Spect Images QGS EDV:  107 ml QGS ESV:  40 ml  Impression Exercise Capacity:  Lexiscan with no exercise. BP Response:  Normal blood pressure response. Clinical Symptoms:  No chest pain. ECG Impression:  No significant ST segment change suggestive of ischemia. Comparison with Prior Nuclear Study: No change from report of Sept 2014.   Overall Impression:  Normal stress nuclear study.  LV Ejection Fraction: 63%.  LV Wall Motion:  NL LV Function; NL Wall Motion  [name]

## 2014-05-31 ENCOUNTER — Telehealth: Payer: Self-pay | Admitting: Cardiovascular Disease

## 2014-05-31 ENCOUNTER — Telehealth: Payer: Self-pay | Admitting: *Deleted

## 2014-05-31 MED ORDER — METOLAZONE 5 MG PO TABS: ORAL_TABLET | ORAL | Status: DC

## 2014-05-31 NOTE — Telephone Encounter (Signed)
Called pt and he put his daughter [name] on the phone and I confirmed 06/07/14 genetic appt w/ her.  Mailed welcoming packet.  Emailed [employer] [place] at [employer] to make them aware.

## 2014-05-31 NOTE — Telephone Encounter (Signed)
New message      Pt has gained 9 lbs in 4 days.  Pt has no symptoms.  Pt has been taken off of his metolazone 2 days ago.  Feet/ankle swelling has not changed.

## 2014-05-31 NOTE — Telephone Encounter (Signed)
Reviewed with Dr. Aundra Dubin and pt should take Metolazone 5 mg tomorrow and Monday and then every Monday. Lab work to be checked at CHF appt on June 11, 2014. I spoke with pt's daughter and gave her these instructions.

## 2014-05-31 NOTE — Telephone Encounter (Signed)
Pt in cardiopulmonary program where he monitors weight and vital signs at home and followed by home health nurse. First home health visit today. Nurse reports pt's weight today is 241 lbs. Last recorded weight was 232 lbs on 8/17.  Per pt's daughter no increase in swelling. No shortness of breath. No increased sodium intake. Metolazone stopped recently due to renal function. Taking furosemide as listed on medication list. Will review with Dr. Aundra Dubin

## 2014-06-02 ENCOUNTER — Encounter: Payer: Self-pay | Admitting: Physician Assistant

## 2014-06-07 ENCOUNTER — Other Ambulatory Visit: Payer: Medicare Other

## 2014-06-07 ENCOUNTER — Telehealth: Payer: Self-pay | Admitting: *Deleted

## 2014-06-07 NOTE — Telephone Encounter (Signed)
Pt called stating that he has been up all night and is not feeling well at all.  Rescheduled and confirmed genetic appt for 06/12/14 w/ him.

## 2014-06-11 ENCOUNTER — Ambulatory Visit (HOSPITAL_COMMUNITY)
Admission: RE | Admit: 2014-06-11 | Discharge: 2014-06-11 | Disposition: A | Discharge status: Home / Self Care | Payer: Medicare Other | Source: Ambulatory Visit | Attending: Cardiology | Admitting: Cardiology

## 2014-06-11 ENCOUNTER — Encounter (HOSPITAL_COMMUNITY): Payer: Self-pay

## 2014-06-11 VITALS — BP 122/68 | HR 73 | Wt 239.5 lb

## 2014-06-11 DIAGNOSIS — C50921 Malignant neoplasm of unspecified site of right male breast: Secondary | ICD-10-CM

## 2014-06-11 DIAGNOSIS — F1210 Cannabis abuse, uncomplicated: Secondary | ICD-10-CM | POA: Insufficient documentation

## 2014-06-11 DIAGNOSIS — N189 Chronic kidney disease, unspecified: Secondary | ICD-10-CM | POA: Insufficient documentation

## 2014-06-11 DIAGNOSIS — Z794 Long term (current) use of insulin: Secondary | ICD-10-CM | POA: Insufficient documentation

## 2014-06-11 DIAGNOSIS — N179 Acute kidney failure, unspecified: Secondary | ICD-10-CM | POA: Insufficient documentation

## 2014-06-11 DIAGNOSIS — I129 Hypertensive chronic kidney disease with stage 1 through stage 4 chronic kidney disease, or unspecified chronic kidney disease: Secondary | ICD-10-CM | POA: Diagnosis present

## 2014-06-11 DIAGNOSIS — G4733 Obstructive sleep apnea (adult) (pediatric): Secondary | ICD-10-CM | POA: Insufficient documentation

## 2014-06-11 DIAGNOSIS — C50929 Malignant neoplasm of unspecified site of unspecified male breast: Secondary | ICD-10-CM | POA: Diagnosis not present

## 2014-06-11 DIAGNOSIS — I509 Heart failure, unspecified: Secondary | ICD-10-CM | POA: Diagnosis not present

## 2014-06-11 DIAGNOSIS — Q619 Cystic kidney disease, unspecified: Secondary | ICD-10-CM | POA: Insufficient documentation

## 2014-06-11 DIAGNOSIS — M545 Low back pain, unspecified: Secondary | ICD-10-CM | POA: Diagnosis not present

## 2014-06-11 DIAGNOSIS — J449 Chronic obstructive pulmonary disease, unspecified: Secondary | ICD-10-CM | POA: Insufficient documentation

## 2014-06-11 DIAGNOSIS — I2510 Atherosclerotic heart disease of native coronary artery without angina pectoris: Secondary | ICD-10-CM | POA: Insufficient documentation

## 2014-06-11 DIAGNOSIS — Z87891 Personal history of nicotine dependence: Secondary | ICD-10-CM | POA: Diagnosis not present

## 2014-06-11 DIAGNOSIS — Z7982 Long term (current) use of aspirin: Secondary | ICD-10-CM | POA: Insufficient documentation

## 2014-06-11 DIAGNOSIS — E039 Hypothyroidism, unspecified: Secondary | ICD-10-CM | POA: Insufficient documentation

## 2014-06-11 DIAGNOSIS — I6529 Occlusion and stenosis of unspecified carotid artery: Secondary | ICD-10-CM | POA: Diagnosis not present

## 2014-06-11 DIAGNOSIS — Z79899 Other long term (current) drug therapy: Secondary | ICD-10-CM | POA: Insufficient documentation

## 2014-06-11 DIAGNOSIS — J4489 Other specified chronic obstructive pulmonary disease: Secondary | ICD-10-CM | POA: Insufficient documentation

## 2014-06-11 DIAGNOSIS — E785 Hyperlipidemia, unspecified: Secondary | ICD-10-CM | POA: Insufficient documentation

## 2014-06-11 DIAGNOSIS — E119 Type 2 diabetes mellitus without complications: Secondary | ICD-10-CM | POA: Insufficient documentation

## 2014-06-11 DIAGNOSIS — I5032 Chronic diastolic (congestive) heart failure: Secondary | ICD-10-CM | POA: Diagnosis not present

## 2014-06-11 DIAGNOSIS — Z951 Presence of aortocoronary bypass graft: Secondary | ICD-10-CM | POA: Insufficient documentation

## 2014-06-11 DIAGNOSIS — I4891 Unspecified atrial fibrillation: Secondary | ICD-10-CM | POA: Insufficient documentation

## 2014-06-11 DIAGNOSIS — I872 Venous insufficiency (chronic) (peripheral): Secondary | ICD-10-CM | POA: Insufficient documentation

## 2014-06-11 DIAGNOSIS — F1010 Alcohol abuse, uncomplicated: Secondary | ICD-10-CM | POA: Insufficient documentation

## 2014-06-11 DIAGNOSIS — I480 Paroxysmal atrial fibrillation: Secondary | ICD-10-CM

## 2014-06-11 HISTORY — DX: Malignant neoplasm of unspecified site of right female breast: C50.911

## 2014-06-11 LAB — BASIC METABOLIC PANEL
Anion gap: 14 (ref 5–15)
BUN: 57 mg/dL — AB (ref 6–23)
CALCIUM: 9.9 mg/dL (ref 8.4–10.5)
CHLORIDE: 88 meq/L — AB (ref 96–112)
CO2: 36 mEq/L — ABNORMAL HIGH (ref 19–32)
CREATININE: 1.1 mg/dL (ref 0.50–1.35)
GFR calc Af Amer: 77 mL/min — ABNORMAL LOW (ref >90)
GFR calc non Af Amer: 66 mL/min — ABNORMAL LOW (ref >90)
GLUCOSE: 142 mg/dL — AB (ref 70–99)
Potassium: 4.7 mEq/L (ref 3.7–5.3)
Sodium: 138 mEq/L (ref 137–147)

## 2014-06-11 LAB — PRO B NATRIURETIC PEPTIDE: Pro B Natriuretic peptide (BNP): 4440 pg/mL — ABNORMAL HIGH (ref 0–125)

## 2014-06-11 NOTE — Patient Instructions (Signed)
Continue current medications.  Labs today  Your physician recommends that you schedule a follow-up appointment in: 1 month with Dr.McLean  Do the following things EVERYDAY: 1) Weigh yourself in the morning before breakfast. Write it down and keep it in a log. 2) Take your medicines as prescribed 3) Eat low salt foods-Limit salt (sodium) to 2000 mg per day.  4) Stay as active as you can everyday 5) Limit all fluids for the day to less than 2 liters 6)

## 2014-06-11 NOTE — Progress Notes (Signed)
Patient ID: [name], male   DOB: 05/16/1946, 68 y.o.   MRN: [id number] Dr. Noberto Retort - PCP at [facility]  Weight Range   270 pounds  Baseline proBNP   4000 09/21/12    HPI: [name] is a 68 yo male with PMX s/f CAD (s/p CABG x 4 in 2001, normal Myoview 2010), chronic diastolic CHF, chronic a-fib, DM, HTN, HL, carotid artery disease, COPD, hypothyroidism, EtOH abuse, chronic venous insufficiency, CKD, and morbid obesity.  He has not been on anticoagulants due to ETOH abuse and fall risk.   ECHO 09/2011: LVEF 45-50%, mod biatral enlargement, moderate RV dilatation, moderate TR, PASP 66 mmHg.  ECHO 08/2012 EF 55-60% moderate bilateral RA/LA enlargement ECHO 7/15 EF 50-55%, moderately dilated RV.   Admitted to [facility] 7/5 through 04/27/13 with massive volume overload. Sluggish diuresis with intermittent lasix, and he was transitioned to Lasix drip and dopamine drip at 3 mcg/kg/hr. Diuresis was sluggish until diamox added with marked improvement. Later switched to torsemide 80 mg in am and 40 mg in pm.  Diuresed over 40 pounds. Discharge weight was 230 pounds.   He was admitted in 7/15 with acute on chronic diastolic CHF and responded poorly to IV Lasix.  He required hemodialysis transiently but was able to stop it.  He was sent home on Lasix 160 mg bid. He is taking metolazone once a week.  Recently, he was diagnosed with right breast cancer and will need right total mastectomy.  He was set up with a pre-operative Lexiscan Cardiolite that showed EF 63%, no ischemia or infarction.  Weight is stable compared to prior appt in this office.  He chronic atrial fibrillation but is not anticoagulated due to ETOH abuse.  He currently is drinking 2-3 glasses of whiskey/day.  Symptomatically, he is stable.  No dyspnea with slow walking.  He sleeps on his right side.  No chest pain or PND.    Labs:  04/30/13 Creatinine 1.46 Potassium 4.0 05/28/13 Creatinine 1.19 Potassium 4.3 07/17/13 Creatinine 1.2, K+  4.7 1/15 creatinine 1.6, K 3.5, LFTs normal 8/15 creatinine 1.4, K 3.6  ROS: All systems negative except as listed in HPI, PMH and Problem List.  Past Medical History  Diagnosis Date  . CHRONIC OBSTRUCTIVE PULMONARY DISEASE 06/20/2009  . OBSTRUCTIVE SLEEP APNEA 06/20/2009  . CAROTID STENOSIS 06/20/2009    A. 08/2001 s/p L CEA;  B.   09/14/11 - Carotid U/S - 40-59% bilateral stenosis, left CEA patch angioplasty is patent  . DM 06/20/2009  . CAD 06/20/2009    A.  08/2000 - s/p CABG x 4 - LIMA-LAD, Left Radial-OM, VG-DIAG, VG-RCA;  B. Neg. MV  2010  . HYPERLIPIDEMIA 06/20/2009  . HYPERTENSION 06/20/2009  . Hypothyroidism   . Low back pain   . Asthma     as child  . Pneumonia   . Atrial fibrillation     Not felt to be coumadin candidate 2/2 ETOH use.  [name] ETOH abuse   . History of tobacco abuse     remote - quit 1970  . Bilateral renal cysts   . Marijuana abuse   . Morbidly obese   . CHF (congestive heart failure)   . Falls frequently   . Hx of cardiovascular stress test 05/2014    Lexiscan Myoview (8/15):  No ischemia; EF 63% - Normal Study  . Breast cancer, right breast     Current Outpatient Prescriptions  Medication Sig Dispense Refill  . aspirin 81 MG chewable  tablet Chew 1 tablet (81 mg total) by mouth daily.  30 tablet  1  . atorvastatin (LIPITOR) 20 MG tablet Take 20 mg by mouth daily.      . carvedilol (COREG) 3.125 MG tablet Take 1 tablet (3.125 mg total) by mouth 2 (two) times daily with a meal.  60 tablet  5  . colchicine 0.6 MG tablet Take 0.6 mg by mouth daily as needed (for gout).      [name] FLUoxetine (PROZAC) 20 MG capsule Take 20 mg by mouth daily.        . fluticasone (FLOVENT HFA) 110 MCG/ACT inhaler Inhale 1 puff into the lungs 2 (two) times daily.      . furosemide (LASIX) 80 MG tablet Take 2 tablets (160 mg total) by mouth 2 (two) times daily.  120 tablet  5  . gabapentin (NEURONTIN) 300 MG capsule Take 1 capsule (300 mg total) by mouth daily.      [name] glucose blood  (BAYER CONTOUR TEST) test strip 1 each by Other route as needed. USE TO TEST BLOOD SUGAR TWICE DAILY      . hydrocortisone (ANUSOL-HC) 2.5 % rectal cream Place rectally 3 (three) times daily as needed for hemorrhoids or itching.  30 g  0  . Insulin Glargine (LANTUS SOLOSTAR) 100 UNIT/ML Solostar Pen Inject 16 Units into the skin at bedtime.       [name] lactulose (CHRONULAC) 10 GM/15ML solution Take 20 g by mouth 2 (two) times daily.      [name] levothyroxine (SYNTHROID, LEVOTHROID) 150 MCG tablet Take 150 mcg by mouth at bedtime.       . metolazone (ZAROXOLYN) 5 MG tablet Take one tablet by mouth 30 minutes before AM Lasix on Monday. Take extra potassium tab on metolazone day.  15 tablet  3  . Multiple Vitamin (MULTIVITAMIN WITH MINERALS) TABS tablet Take 1 tablet by mouth daily.      [name] omeprazole (PRILOSEC) 20 MG capsule Take 20 mg by mouth daily.        . Oxycodone HCl 10 MG TABS Take 10 mg by mouth every 8 (eight) hours as needed (for pain).      . Potassium Chloride ER 20 MEQ TBCR Take 20 mEq by mouth daily. 1 TABLET DAILY, EXCEPT ON MONDAYS TAKE 2      . senna (SENOKOT) 8.6 MG TABS tablet Take 1 tablet by mouth 2 (two) times daily as needed for mild constipation.       No current facility-administered medications for this encounter.   Filed Vitals:   06/11/14 1545  BP: 122/68  Pulse: 73  Weight: 239 lb 8 oz (108.636 kg)  SpO2: 93%    PHYSICAL EXAM: General:  Chronically ill appearing. No resp difficulty in wheelchair; daughter present HEENT: normal Neck: supple. JVP appears ~ 8 cm  Carotids 2+ bilaterally; no bruits. No lymphadenopathy or thryomegaly appreciated. Cor: PMI normal. Regular rate & irregular rhythm. No rubs, gallops. 1/6 SEM RUSB  Lungs: CTA Abdomen: obese, soft, nontender, mildly distended. No hepatosplenomegaly. No bruits or masses. Good bowel sounds. Extremities: no cyanosis, clubbing, rash, 1+ edema 1/3 to knees bilaterally (L>R, chronic) Neuro: alert & orientedx3, cranial  nerves grossly intact. Moves all 4 extremities w/o difficulty. Affect pleasant.  ASSESSMENT & PLAN:  1) Chronic diastolic HF: EF 01-09% with moderately dilated RV on 7/15 echo.  NYHA II-III symptoms with mild volume overload.   - Continue Lasix 160 mg bid with metolazone every Monday 30 minutes prior to  Lasix in am. - BMET/BNP today.  2) A-Fib: Stable, rate controlled on Coreg. He continues to drink, probably heavily at times.  Will hold off on anticoagulation for now.  3) HTN: BP controlled.  4) CAD: No ischemic symptoms.  Normal Cardiolite in 8/15. Continue ASA and statin.   5) Carotid stenosis: 60-79% bilateral ICA stenosis, repeat dopplers in 11/15.  6) CKD: BMET today.  7) Breast cancer: Needs right mastectomy.  Lexiscan Cardiolite was normal.  He will be higher risk than average given his h/o CAD and diastolic CHF, but there is no contraindication to surgery.   Follow up 1 month to reassess volume status.  Loralie Champagne 06/11/2014

## 2014-06-12 ENCOUNTER — Other Ambulatory Visit: Payer: Medicare Other

## 2014-06-12 ENCOUNTER — Ambulatory Visit (HOSPITAL_BASED_OUTPATIENT_CLINIC_OR_DEPARTMENT_OTHER): Payer: Medicare Other | Admitting: Genetic Counselor

## 2014-06-12 DIAGNOSIS — C50929 Malignant neoplasm of unspecified site of unspecified male breast: Secondary | ICD-10-CM

## 2014-06-12 DIAGNOSIS — C50921 Malignant neoplasm of unspecified site of right male breast: Secondary | ICD-10-CM

## 2014-06-12 DIAGNOSIS — Z800 Family history of malignant neoplasm of digestive organs: Secondary | ICD-10-CM

## 2014-06-12 NOTE — Progress Notes (Signed)
HISTORY OF PRESENT ILLNESS: Dr. Noberto Retort requested a cancer genetics consultation for [name], a 68 y.o. male, due to a personal and family history of cancer.  [name] presents to clinic today to discuss the possibility of a hereditary predisposition to cancer, genetic testing, and to further clarify his future cancer risks, as well as potential cancer risk for family members. [name] was diagnosed with right invasive ductal breast cancer at the age of 78, which is ER/PR+ and Her2NEU-. He is planning to undergo surgery in the near future. He was accompanied to clinic by his daughter, [name].   Past Medical History  Diagnosis Date   CHRONIC OBSTRUCTIVE PULMONARY DISEASE 06/20/2009   OBSTRUCTIVE SLEEP APNEA 06/20/2009   CAROTID STENOSIS 06/20/2009    A. 08/2001 s/p L CEA;  B.   09/14/11 - Carotid U/S - 40-59% bilateral stenosis, left CEA patch angioplasty is patent   DM 06/20/2009   CAD 06/20/2009    A.  08/2000 - s/p CABG x 4 - LIMA-LAD, Left Radial-OM, VG-DIAG, VG-RCA;  B. Neg. MV  2010   HYPERLIPIDEMIA 06/20/2009   HYPERTENSION 06/20/2009   Hypothyroidism    Low back pain    Asthma     as child   Pneumonia    Atrial fibrillation     Not felt to be coumadin candidate 2/2 ETOH use.   ETOH abuse    History of tobacco abuse     remote - quit 1970   Bilateral renal cysts    Marijuana abuse    Morbidly obese    CHF (congestive heart failure)    Falls frequently    Hx of cardiovascular stress test 05/2014    Lexiscan Myoview (8/15):  No ischemia; EF 63% - Normal Study   Breast cancer, right breast     Past Surgical History  Procedure Laterality Date   Carotid endarterectomy  2002    left   Coronary artery bypass graft      x 4 - 3220   Umbilical hernia repair N/A 01/28/2014    Procedure: HERNIA REPAIR UMBILICAL ADULT/INC;  Surgeon: Harl Bowie, MD;  Location: [facility];  Service: General;  Laterality: N/A;   Laparotomy N/A 01/28/2014    Procedure:  EXPLORATORY LAPAROTOMY;  Surgeon: Harl Bowie, MD;  Location: [place];  Service: General;  Laterality: N/A;   Bowel resection N/A 01/28/2014    Procedure: SMALL BOWEL RESECTION;  Surgeon: Harl Bowie, MD;  Location: [facility];  Service: General;  Laterality: N/A;   Hernia repair     History   Social History   Marital Status: Single    Spouse Name: N/A    Number of Children: N/A   Years of Education: N/A   Occupational History   retired    Social History Main Topics   Smoking status: Former Smoker    Quit date: 10/11/1968   Smokeless tobacco: Not on file   Alcohol Use: Yes     Comment: wild [place] daily;  also reports regularly drinking a pint of vodka.   Drug Use: Yes    Special: Marijuana     Comment: last 1 year ago   Sexual Activity: Not on file   Other Topics Concern   Not on file   Social History Narrative   Lives in [place] with dtr, son-in-law     FAMILY HISTORY:  During the visit, a 4-generation pedigree was obtained. Significant diagnoses include the following:  Family History  Problem  Relation Age of Onset   Stroke Mother     ?   Cancer Mother     unknown type of cancer   Stroke Father     ?   Heart attack Neg Hx    Cancer Brother 6    stomach cancer    [name] ancestry is of Caucasian descent. There is no known Jewish ancestry or consanguinity.  GENETIC COUNSELING ASSESSMENT: [name] is a 68 y.o. male with a personal and family history of cancer suggestive of a hereditary predisposition to cancer. We, therefore, discussed and recommended the following at today's visit.   DISCUSSION: We reviewed the characteristics, features and inheritance patterns of hereditary cancer syndromes. We also discussed genetic testing, including the appropriate family members to test, the process of testing, insurance coverage and turn-around-time for results. We discussed the implications of a negative, positive and/or variant of uncertain  significant result. We recommended [name] pursue genetic testing for the [employer] gene panel.   PLAN: Based on our above recommendation, [name] wished to pursue genetic testing and the blood sample was drawn and will be sent to [facility] for analysis. Results should be available within approximately 5 weeks time, at which point they will be disclosed by telephone to [name], as will any additional recommendations warranted by these results. We also encouraged [name] to remain in contact with cancer genetics annually so that we can continuously update the family history and inform him of any changes in cancer genetics and testing that may be of benefit for this family. Mr.  [name] questions were answered to his satisfaction today. Our contact information was provided should additional questions or concerns arise.   Thank you for the referral and allowing [place] to share in the care of your patient.   The patient was seen for a total of 40 minutes in face-to-face genetic counseling.  This patient was discussed with Dr. Jana Hakim who agrees with the above.    _______________________________________________________________________ For Office Staff:  Number of people involved in session: 3 Was an Intern/ student involved with case: not applicable

## 2014-07-09 ENCOUNTER — Telehealth: Payer: Self-pay | Admitting: Cardiovascular Disease

## 2014-07-09 NOTE — Telephone Encounter (Signed)
Spoke with pt's daughter and told her note clearing pt for surgery had been sent to Dr. Lear Ng office on June 03, 2014 and that I had given stress test results with clearance note to medical records to fax again today.

## 2014-07-09 NOTE — Telephone Encounter (Signed)
New message    Daughter calling waiting on surgical clearance to send to [facility] Dr. Excell Seltzer . Phone # [phone]. fax  [phone]. Would like a call back today

## 2014-07-09 NOTE — Telephone Encounter (Signed)
Stress Test faxed to Dr.Hoxworth/[facility] Attn:Tina @ [phone]   9.29.15/km

## 2014-07-15 ENCOUNTER — Encounter (HOSPITAL_COMMUNITY): Payer: Self-pay

## 2014-07-15 ENCOUNTER — Ambulatory Visit (HOSPITAL_COMMUNITY)
Admission: RE | Admit: 2014-07-15 | Discharge: 2014-07-15 | Disposition: A | Discharge status: Home / Self Care | Payer: Medicare Other | Source: Ambulatory Visit | Attending: Internal Medicine | Admitting: Internal Medicine

## 2014-07-15 VITALS — BP 120/62 | HR 74 | Wt 242.0 lb

## 2014-07-15 DIAGNOSIS — Z9181 History of falling: Secondary | ICD-10-CM | POA: Insufficient documentation

## 2014-07-15 DIAGNOSIS — Z794 Long term (current) use of insulin: Secondary | ICD-10-CM | POA: Diagnosis not present

## 2014-07-15 DIAGNOSIS — N281 Cyst of kidney, acquired: Secondary | ICD-10-CM | POA: Insufficient documentation

## 2014-07-15 DIAGNOSIS — I4891 Unspecified atrial fibrillation: Secondary | ICD-10-CM | POA: Diagnosis not present

## 2014-07-15 DIAGNOSIS — Z7982 Long term (current) use of aspirin: Secondary | ICD-10-CM | POA: Diagnosis not present

## 2014-07-15 DIAGNOSIS — I5032 Chronic diastolic (congestive) heart failure: Secondary | ICD-10-CM | POA: Diagnosis present

## 2014-07-15 DIAGNOSIS — I2510 Atherosclerotic heart disease of native coronary artery without angina pectoris: Secondary | ICD-10-CM | POA: Insufficient documentation

## 2014-07-15 DIAGNOSIS — Z79899 Other long term (current) drug therapy: Secondary | ICD-10-CM | POA: Insufficient documentation

## 2014-07-15 DIAGNOSIS — F1010 Alcohol abuse, uncomplicated: Secondary | ICD-10-CM | POA: Diagnosis not present

## 2014-07-15 DIAGNOSIS — J449 Chronic obstructive pulmonary disease, unspecified: Secondary | ICD-10-CM | POA: Insufficient documentation

## 2014-07-15 DIAGNOSIS — C50921 Malignant neoplasm of unspecified site of right male breast: Secondary | ICD-10-CM | POA: Insufficient documentation

## 2014-07-15 DIAGNOSIS — I6523 Occlusion and stenosis of bilateral carotid arteries: Secondary | ICD-10-CM | POA: Insufficient documentation

## 2014-07-15 DIAGNOSIS — F1210 Cannabis abuse, uncomplicated: Secondary | ICD-10-CM | POA: Diagnosis not present

## 2014-07-15 DIAGNOSIS — Z01810 Encounter for preprocedural cardiovascular examination: Secondary | ICD-10-CM

## 2014-07-15 DIAGNOSIS — E119 Type 2 diabetes mellitus without complications: Secondary | ICD-10-CM | POA: Diagnosis not present

## 2014-07-15 DIAGNOSIS — G4733 Obstructive sleep apnea (adult) (pediatric): Secondary | ICD-10-CM | POA: Insufficient documentation

## 2014-07-15 DIAGNOSIS — I482 Chronic atrial fibrillation, unspecified: Secondary | ICD-10-CM

## 2014-07-15 DIAGNOSIS — M545 Low back pain: Secondary | ICD-10-CM | POA: Diagnosis not present

## 2014-07-15 DIAGNOSIS — I129 Hypertensive chronic kidney disease with stage 1 through stage 4 chronic kidney disease, or unspecified chronic kidney disease: Secondary | ICD-10-CM | POA: Diagnosis not present

## 2014-07-15 DIAGNOSIS — E785 Hyperlipidemia, unspecified: Secondary | ICD-10-CM | POA: Diagnosis not present

## 2014-07-15 DIAGNOSIS — E039 Hypothyroidism, unspecified: Secondary | ICD-10-CM | POA: Diagnosis not present

## 2014-07-15 DIAGNOSIS — N189 Chronic kidney disease, unspecified: Secondary | ICD-10-CM | POA: Diagnosis not present

## 2014-07-15 NOTE — Progress Notes (Signed)
Patient ID: [name], male   DOB: [birth year], 68 y.o.   MRN: [id number]  Dr. Noberto Retort - PCP at [facility] Surgeon: Dr Excell Seltzer Oncologist: Pending.   Weight Range   270 pounds  Baseline proBNP   4000 09/21/12    HPI: [name] is a 68 yo male with PMX s/f CAD (s/p CABG x 4 in 2001, normal Myoview 2010), chronic diastolic CHF, chronic a-fib, DM, HTN, HL, carotid artery disease, COPD, hypothyroidism, EtOH abuse, chronic venous insufficiency, CKD, and morbid obesity.  [name] has not been on anticoagulants due to ETOH abuse and fall risk.   Admitted to [facility] 7/5 through 04/27/13 with massive volume overload. Sluggish diuresis with intermittent lasix, and [name] was transitioned to Lasix drip and dopamine drip at 3 mcg/kg/hr. Diuresis was sluggish until diamox added with marked improvement. Later switched to torsemide 80 mg in am and 40 mg in pm.  Diuresed over 40 pounds. Discharge weight was 230 pounds.   [name] was admitted in 7/15 with acute on chronic diastolic CHF and responded poorly to IV Lasix.  [name] required hemodialysis transiently but was able to stop it.  [name] was sent home on Lasix 160 mg bid.   [name] returns for follow up. Last [name] was cleared for R mastectomy. Overall feeling ok. + Orthopnea. Denies dyspnea. Ambulates with a walker. Weight at home 239-240 pounds. [name] chronic atrial fibrillation but is not anticoagulated due to ETOH abuse.  [name] is drinking 2-3 shots of whiskey 2-3 days a week. Daughter lives with him.    ECHO 09/2011: LVEF 45-50%, mod biatral enlargement, moderate RV dilatation, moderate TR, PASP 66 mmHg.  ECHO 08/2012 EF 55-60% moderate bilateral RA/LA enlargement ECHO 7/15 EF 50-55%, moderately dilated RV.   05/2014- Pre-operative Lexiscan Cardiolite that showed EF 63%, no ischemia or infarction.   Labs:  04/30/13 Creatinine 1.46 Potassium 4.0 05/28/13 Creatinine 1.19 Potassium 4.3 07/17/13 Creatinine 1.2, K+ 4.7 1/15 creatinine 1.6, K 3.5, LFTs normal 8/15 creatinine  1.4, K 3.6 06/11/14 Creatinine 1.1 K 4.7   ROS: All systems negative except as listed in HPI, PMH and Problem List.  Past Medical History  Diagnosis Date  . CHRONIC OBSTRUCTIVE PULMONARY DISEASE 06/20/2009  . OBSTRUCTIVE SLEEP APNEA 06/20/2009  . CAROTID STENOSIS 06/20/2009    A. 08/2001 s/p L CEA;  B.   09/14/11 - Carotid U/S - 40-59% bilateral stenosis, left CEA patch angioplasty is patent  . DM 06/20/2009  . CAD 06/20/2009    A.  08/2000 - s/p CABG x 4 - LIMA-LAD, Left Radial-OM, VG-DIAG, VG-RCA;  B. Neg. MV  2010  . HYPERLIPIDEMIA 06/20/2009  . HYPERTENSION 06/20/2009  . Hypothyroidism   . Low back pain   . Asthma     as child  . Pneumonia   . Atrial fibrillation     Not felt to be coumadin candidate 2/2 ETOH use.  Marland Kitchen ETOH abuse   . History of tobacco abuse     remote - quit 1970  . Bilateral renal cysts   . Marijuana abuse   . Morbidly obese   . CHF (congestive heart failure)   . Falls frequently   . Hx of cardiovascular stress test 05/2014    Lexiscan Myoview (8/15):  No ischemia; EF 63% - Normal Study  . Breast cancer, right breast     Current Outpatient Prescriptions  Medication Sig Dispense Refill  . aspirin 81 MG chewable tablet Chew 1 tablet (81 mg total) by mouth daily.  30 tablet  1  . atorvastatin (LIPITOR) 20 MG tablet Take 20 mg by mouth daily.      . carvedilol (COREG) 3.125 MG tablet Take 1 tablet (3.125 mg total) by mouth 2 (two) times daily with a meal.  60 tablet  5  . colchicine 0.6 MG tablet Take 0.6 mg by mouth daily as needed (for gout).      Marland Kitchen FLUoxetine (PROZAC) 20 MG capsule Take 20 mg by mouth daily.        . fluticasone (FLOVENT HFA) 110 MCG/ACT inhaler Inhale 1 puff into the lungs 2 (two) times daily.      . furosemide (LASIX) 80 MG tablet Take 2 tablets (160 mg total) by mouth 2 (two) times daily.  120 tablet  5  . gabapentin (NEURONTIN) 300 MG capsule Take 1 capsule (300 mg total) by mouth daily.      Marland Kitchen glucose blood (BAYER CONTOUR TEST) test strip 1  each by Other route as needed. USE TO TEST BLOOD SUGAR TWICE DAILY      . hydrocortisone (ANUSOL-HC) 2.5 % rectal cream Place rectally 3 (three) times daily as needed for hemorrhoids or itching.  30 g  0  . Insulin Glargine (LANTUS SOLOSTAR) 100 UNIT/ML Solostar Pen Inject 16 Units into the skin at bedtime.       Marland Kitchen lactulose (CHRONULAC) 10 GM/15ML solution Take 20 g by mouth 2 (two) times daily.      Marland Kitchen levothyroxine (SYNTHROID, LEVOTHROID) 150 MCG tablet Take 150 mcg by mouth at bedtime.       . metolazone (ZAROXOLYN) 5 MG tablet Take one tablet by mouth 30 minutes before AM Lasix on Monday. Take extra potassium tab on metolazone day.  15 tablet  3  . Multiple Vitamin (MULTIVITAMIN WITH MINERALS) TABS tablet Take 1 tablet by mouth daily.      Marland Kitchen omeprazole (PRILOSEC) 20 MG capsule Take 20 mg by mouth daily.        . Oxycodone HCl 10 MG TABS Take 10 mg by mouth every 8 (eight) hours as needed (for pain).      . Potassium Chloride ER 20 MEQ TBCR Take 20 mEq by mouth daily. 1 TABLET DAILY, EXCEPT ON MONDAYS TAKE 2      . senna (SENOKOT) 8.6 MG TABS tablet Take 1 tablet by mouth 2 (two) times daily as needed for mild constipation.       No current facility-administered medications for this encounter.   Filed Vitals:   07/15/14 1420  BP: 120/62  Pulse: 74  Weight: 242 lb (109.77 kg)  SpO2: 89%    PHYSICAL EXAM: General:  Chronically ill appearing. No resp difficulty. Ambulated in the clinic with a rolling walker. daughter present HEENT: normal Neck: supple. JVP appears ~7-8 cm  Carotids 2+ bilaterally; no bruits. No lymphadenopathy or thryomegaly appreciated. Cor: PMI normal. Irregular rate & irregular rhythm. No rubs, gallops. 1/6 SEM RUSB  Lungs: CTA Abdomen: obese, soft, nontender, mildly distended. No hepatosplenomegaly. No bruits or masses. Good bowel sounds. Extremities: no cyanosis, clubbing, rash, trace edema  (L>R, chronic) Neuro: alert & orientedx3, cranial nerves grossly intact.  Moves all 4 extremities w/o difficulty. Affect pleasant.  ASSESSMENT & PLAN:  1) Chronic diastolic HF: EF 29-51% with moderately dilated RV on 7/15 echo.  NYHA II-III symptoms. Volume status stable.   - Continue Lasix 160 mg bid with metolazone every Monday 30 minutes prior to Lasix in am. 2) A-Fib: Stable, rate controlled on Coreg. [name] continues  to drink alcohol.  Will hold off on anticoagulation due to ongoing alcohol intake.   3) HTN: BP controlled.  4) CAD: No ischemic symptoms.  Normal Cardiolite in 8/15. Continue ASA and statin.   5) Carotid stenosis: 60-79% bilateral ICA stenosis,  Most recent doppler 11/14. Repeat carotid u/s 6) CKD: Reviewed lab work from 9/1. Renal function stable.   7) Breast cancer: Needs right mastectomy.  Lexiscan Cardiolite was normal.  Given [name] h/o CAD and diastolic CHF [name] is still at moderate, but acceptable, risk for peri-op CV complications. Can proceed without further cardiac testing. Ideally should be done at [facility] (versus [facility]), if possible so we can follow as needed. Will forward the note from today to Dr Excell Seltzer.   Follow up 2-3 months.   CLEGG,AMY NP-C  07/15/2014  Patient seen and examined with Darrick Grinder, NP. We discussed all aspects of the encounter. I agree with the assessment and plan as stated above. Volume status looks good. Reinforced need for daily weights and reviewed use of sliding scale diuretics. I have reviewed stress test and feel [name] can proceed with surgery without further testing (moderate risk). Due for repeat carotid u/s.  Samreen Seltzer,MD 4:04 PM

## 2014-07-15 NOTE — Patient Instructions (Signed)
Follow up in 2-3 months  Check carotid dopplers in November   Do the following things EVERYDAY: 1) Weigh yourself in the morning before breakfast. Write it down and keep it in a log. 2) Take your medicines as prescribed 3) Eat low salt foods-Limit salt (sodium) to 2000 mg per day.  4) Stay as active as you can everyday 5) Limit all fluids for the day to less than 2 liters

## 2014-07-16 ENCOUNTER — Encounter: Payer: Self-pay | Admitting: Genetic Counselor

## 2014-07-16 DIAGNOSIS — Z800 Family history of malignant neoplasm of digestive organs: Secondary | ICD-10-CM

## 2014-07-16 DIAGNOSIS — C50921 Malignant neoplasm of unspecified site of right male breast: Secondary | ICD-10-CM

## 2014-07-16 NOTE — Progress Notes (Signed)
HPI:  [name] was previously seen in the [place] clinic due to a personal and family history of cancer and concerns regarding a hereditary predisposition to cancer. Please refer to our prior cancer genetics clinic note for more information regarding [name] medical, social and family histories, and our assessment and recommendations, at the time. [name] recent genetic test results were disclosed to his daughter, [name], as were recommendations warranted by these results. These results and recommendations are discussed in more detail below.  GENETIC TEST RESULTS: At the time of [name]'s visit, we recommended he pursue genetic testing of the [employer] gene panel. This test, which included sequencing and deletion/duplication analysis of the genes, was performed at [facility]. Genetic testing was normal, and did not reveal a deleterious mutation in these genes. A complete list of all genes tested is located on the test report scanned into [employer].    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 At this time, it is unknown if this variant is associated with an increased risk for cancer or if this is a normal finding. With time, we suspect the lab will reclassify this variant and when they do, we will try to re-contact [name] to discuss the reclassification further.   We discussed with [name] that since the current genetic testing is not perfect, it is possible there may be a gene mutation in one of these genes that current testing cannot detect, but that chance is small.  We also discussed, that it is possible that another gene that has not yet been discovered, or that we have not yet tested, is responsible for the cancer diagnoses in the family, and it is, therefore, important to remain in touch with cancer genetics in the future so that we can continue to offer [name] the most up to date genetic testing.     CANCER SCREENING RECOMMENDATIONS: This result is reassuring and suggests that [name] cancer was most likely not due to an inherited predisposition associated with one of these genes.  Most cancers happen by chance and this negative test suggests that his cancer falls into this category.  We, therefore, recommended he continue to follow the cancer management and screening guidelines provided by his oncology and primary providers.   RECOMMENDATIONS FOR FAMILY MEMBERS:  Individuals in this family might be at some increased risk of developing cancer, over the general population risk, simply due to the family history of cancer.  We recommended women in this family have a yearly mammogram beginning at age 70, an an annual clinical breast exam, and perform monthly breast self-exams. Women in this family should also have a gynecological exam as recommended by their primary provider. All family members should have a colonoscopy by age 70.  FOLLOW-UP: Lastly, we discussed with [name], [name], that cancer genetics is a rapidly advancing field and it is possible that new genetic tests will be appropriate for him and/or her family members in the future. We encouraged him to remain in contact with cancer genetics on an annual basis so we can update his personal and family histories and let him know of advances in cancer genetics that may benefit this family.   Our contact number was provided. [name] questions were answered to his satisfaction, and he knows he is welcome to call us at anytime with additional questions or concerns.   Catherine A. Fine, MS, CGC Certified Genetic Counseor [email or web address]

## 2014-07-18 ENCOUNTER — Other Ambulatory Visit (INDEPENDENT_AMBULATORY_CARE_PROVIDER_SITE_OTHER): Payer: Self-pay | Admitting: General Surgery

## 2014-07-18 DIAGNOSIS — C50911 Malignant neoplasm of unspecified site of right female breast: Secondary | ICD-10-CM

## 2014-07-19 ENCOUNTER — Other Ambulatory Visit (INDEPENDENT_AMBULATORY_CARE_PROVIDER_SITE_OTHER): Payer: Self-pay | Admitting: General Surgery

## 2014-07-19 DIAGNOSIS — C50921 Malignant neoplasm of unspecified site of right male breast: Secondary | ICD-10-CM

## 2014-07-21 ENCOUNTER — Inpatient Hospital Stay (HOSPITAL_COMMUNITY)
Admission: EM | Admit: 2014-07-21 | Discharge: 2014-07-24 | DRG: 292 | Payer: Medicare Other | Attending: Family Medicine | Admitting: Family Medicine

## 2014-07-21 ENCOUNTER — Emergency Department (HOSPITAL_COMMUNITY): Payer: Medicare Other

## 2014-07-21 ENCOUNTER — Encounter (HOSPITAL_COMMUNITY): Payer: Self-pay | Admitting: Emergency Medicine

## 2014-07-21 DIAGNOSIS — I6529 Occlusion and stenosis of unspecified carotid artery: Secondary | ICD-10-CM | POA: Diagnosis present

## 2014-07-21 DIAGNOSIS — Z9119 Patient's noncompliance with other medical treatment and regimen: Secondary | ICD-10-CM | POA: Diagnosis present

## 2014-07-21 DIAGNOSIS — M109 Gout, unspecified: Secondary | ICD-10-CM | POA: Diagnosis present

## 2014-07-21 DIAGNOSIS — E1151 Type 2 diabetes mellitus with diabetic peripheral angiopathy without gangrene: Secondary | ICD-10-CM

## 2014-07-21 DIAGNOSIS — M10021 Idiopathic gout, right elbow: Secondary | ICD-10-CM

## 2014-07-21 DIAGNOSIS — Z853 Personal history of malignant neoplasm of breast: Secondary | ICD-10-CM | POA: Diagnosis not present

## 2014-07-21 DIAGNOSIS — J449 Chronic obstructive pulmonary disease, unspecified: Secondary | ICD-10-CM | POA: Diagnosis present

## 2014-07-21 DIAGNOSIS — C50929 Malignant neoplasm of unspecified site of unspecified male breast: Secondary | ICD-10-CM | POA: Diagnosis present

## 2014-07-21 DIAGNOSIS — G4733 Obstructive sleep apnea (adult) (pediatric): Secondary | ICD-10-CM | POA: Diagnosis present

## 2014-07-21 DIAGNOSIS — F1010 Alcohol abuse, uncomplicated: Secondary | ICD-10-CM | POA: Diagnosis present

## 2014-07-21 DIAGNOSIS — I5032 Chronic diastolic (congestive) heart failure: Secondary | ICD-10-CM

## 2014-07-21 DIAGNOSIS — E1022 Type 1 diabetes mellitus with diabetic chronic kidney disease: Secondary | ICD-10-CM | POA: Diagnosis present

## 2014-07-21 DIAGNOSIS — C50921 Malignant neoplasm of unspecified site of right male breast: Secondary | ICD-10-CM

## 2014-07-21 DIAGNOSIS — Z951 Presence of aortocoronary bypass graft: Secondary | ICD-10-CM | POA: Diagnosis not present

## 2014-07-21 DIAGNOSIS — M1990 Unspecified osteoarthritis, unspecified site: Secondary | ICD-10-CM | POA: Diagnosis present

## 2014-07-21 DIAGNOSIS — E039 Hypothyroidism, unspecified: Secondary | ICD-10-CM | POA: Diagnosis present

## 2014-07-21 DIAGNOSIS — Z794 Long term (current) use of insulin: Secondary | ICD-10-CM | POA: Diagnosis not present

## 2014-07-21 DIAGNOSIS — Z87891 Personal history of nicotine dependence: Secondary | ICD-10-CM

## 2014-07-21 DIAGNOSIS — Z7982 Long term (current) use of aspirin: Secondary | ICD-10-CM | POA: Diagnosis not present

## 2014-07-21 DIAGNOSIS — G8929 Other chronic pain: Secondary | ICD-10-CM | POA: Diagnosis present

## 2014-07-21 DIAGNOSIS — Z6837 Body mass index (BMI) 37.0-37.9, adult: Secondary | ICD-10-CM

## 2014-07-21 DIAGNOSIS — R609 Edema, unspecified: Secondary | ICD-10-CM

## 2014-07-21 DIAGNOSIS — N189 Chronic kidney disease, unspecified: Secondary | ICD-10-CM

## 2014-07-21 DIAGNOSIS — N185 Chronic kidney disease, stage 5: Secondary | ICD-10-CM | POA: Diagnosis present

## 2014-07-21 DIAGNOSIS — E785 Hyperlipidemia, unspecified: Secondary | ICD-10-CM | POA: Diagnosis present

## 2014-07-21 DIAGNOSIS — M419 Scoliosis, unspecified: Secondary | ICD-10-CM | POA: Diagnosis present

## 2014-07-21 DIAGNOSIS — E119 Type 2 diabetes mellitus without complications: Secondary | ICD-10-CM | POA: Diagnosis present

## 2014-07-21 DIAGNOSIS — R2681 Unsteadiness on feet: Secondary | ICD-10-CM | POA: Diagnosis not present

## 2014-07-21 DIAGNOSIS — N179 Acute kidney failure, unspecified: Secondary | ICD-10-CM

## 2014-07-21 DIAGNOSIS — J45909 Unspecified asthma, uncomplicated: Secondary | ICD-10-CM | POA: Diagnosis present

## 2014-07-21 DIAGNOSIS — E1165 Type 2 diabetes mellitus with hyperglycemia: Secondary | ICD-10-CM

## 2014-07-21 DIAGNOSIS — I872 Venous insufficiency (chronic) (peripheral): Secondary | ICD-10-CM | POA: Diagnosis present

## 2014-07-21 DIAGNOSIS — IMO0002 Reserved for concepts with insufficient information to code with codable children: Secondary | ICD-10-CM

## 2014-07-21 DIAGNOSIS — M479 Spondylosis, unspecified: Secondary | ICD-10-CM | POA: Diagnosis present

## 2014-07-21 DIAGNOSIS — M1000 Idiopathic gout, unspecified site: Secondary | ICD-10-CM

## 2014-07-21 DIAGNOSIS — I4891 Unspecified atrial fibrillation: Secondary | ICD-10-CM | POA: Diagnosis present

## 2014-07-21 DIAGNOSIS — I482 Chronic atrial fibrillation, unspecified: Secondary | ICD-10-CM

## 2014-07-21 DIAGNOSIS — I509 Heart failure, unspecified: Secondary | ICD-10-CM | POA: Diagnosis not present

## 2014-07-21 DIAGNOSIS — I2510 Atherosclerotic heart disease of native coronary artery without angina pectoris: Secondary | ICD-10-CM | POA: Diagnosis present

## 2014-07-21 DIAGNOSIS — M1029 Drug-induced gout, multiple sites: Secondary | ICD-10-CM

## 2014-07-21 DIAGNOSIS — I5021 Acute systolic (congestive) heart failure: Secondary | ICD-10-CM

## 2014-07-21 DIAGNOSIS — I5033 Acute on chronic diastolic (congestive) heart failure: Secondary | ICD-10-CM

## 2014-07-21 HISTORY — DX: Gout, unspecified: M10.9

## 2014-07-21 LAB — CBC
HCT: 38.4 % — ABNORMAL LOW (ref 39.0–52.0)
Hemoglobin: 12.8 g/dL — ABNORMAL LOW (ref 13.0–17.0)
MCH: 31.4 pg (ref 26.0–34.0)
MCHC: 33.3 g/dL (ref 30.0–36.0)
MCV: 94.1 fL (ref 78.0–100.0)
Platelets: 129 K/uL — ABNORMAL LOW (ref 150–400)
RBC: 4.08 MIL/uL — ABNORMAL LOW (ref 4.22–5.81)
RDW: 16.4 % — ABNORMAL HIGH (ref 11.5–15.5)
WBC: 5.4 K/uL (ref 4.0–10.5)

## 2014-07-21 LAB — URINALYSIS, ROUTINE W REFLEX MICROSCOPIC
Bilirubin Urine: NEGATIVE
Glucose, UA: NEGATIVE mg/dL
Hgb urine dipstick: NEGATIVE
Ketones, ur: NEGATIVE mg/dL
Leukocytes, UA: NEGATIVE
Nitrite: NEGATIVE
Protein, ur: 30 mg/dL — AB
Specific Gravity, Urine: 1.01 (ref 1.005–1.030)
Urobilinogen, UA: 0.2 mg/dL (ref 0.0–1.0)
pH: 7 (ref 5.0–8.0)

## 2014-07-21 LAB — COMPREHENSIVE METABOLIC PANEL WITH GFR
ALT: 10 U/L (ref 0–53)
AST: 17 U/L (ref 0–37)
Albumin: 3.1 g/dL — ABNORMAL LOW (ref 3.5–5.2)
Alkaline Phosphatase: 78 U/L (ref 39–117)
Anion gap: 11 (ref 5–15)
BUN: 74 mg/dL — ABNORMAL HIGH (ref 6–23)
CO2: 34 meq/L — ABNORMAL HIGH (ref 19–32)
Calcium: 9.5 mg/dL (ref 8.4–10.5)
Chloride: 94 meq/L — ABNORMAL LOW (ref 96–112)
Creatinine, Ser: 1.09 mg/dL (ref 0.50–1.35)
GFR calc Af Amer: 77 mL/min — ABNORMAL LOW
GFR calc non Af Amer: 67 mL/min — ABNORMAL LOW
Glucose, Bld: 210 mg/dL — ABNORMAL HIGH (ref 70–99)
Potassium: 3.7 meq/L (ref 3.7–5.3)
Sodium: 139 meq/L (ref 137–147)
Total Bilirubin: 0.4 mg/dL (ref 0.3–1.2)
Total Protein: 7.5 g/dL (ref 6.0–8.3)

## 2014-07-21 LAB — RAPID URINE DRUG SCREEN, HOSP PERFORMED
Amphetamines: NOT DETECTED
BARBITURATES: NOT DETECTED
BENZODIAZEPINES: NOT DETECTED
COCAINE: NOT DETECTED
Opiates: POSITIVE — AB
TETRAHYDROCANNABINOL: NOT DETECTED

## 2014-07-21 LAB — I-STAT TROPONIN, ED: Troponin i, poc: 0 ng/mL (ref 0.00–0.08)

## 2014-07-21 LAB — URINE MICROSCOPIC-ADD ON

## 2014-07-21 LAB — GLUCOSE, CAPILLARY: Glucose-Capillary: 150 mg/dL — ABNORMAL HIGH (ref 70–99)

## 2014-07-21 LAB — PRO B NATRIURETIC PEPTIDE: Pro B Natriuretic peptide (BNP): 3542 pg/mL — ABNORMAL HIGH (ref 0–125)

## 2014-07-21 LAB — ETHANOL: Alcohol, Ethyl (B): <11 mg/dL (ref 0–11)

## 2014-07-21 MED ORDER — LEVOTHYROXINE SODIUM 150 MCG PO TABS
150.0000 ug | ORAL_TABLET | Freq: Every day | ORAL | Status: DC
  Filled 2014-07-21 (×2): qty 1

## 2014-07-21 MED ORDER — THIAMINE HCL 100 MG/ML IJ SOLN
100.0000 mg | Freq: Every day | INTRAMUSCULAR | Status: DC
  Filled 2014-07-21 (×4): qty 1

## 2014-07-21 MED ORDER — LORAZEPAM 1 MG PO TABS
0.0000 mg | ORAL_TABLET | Freq: Four times a day (QID) | ORAL | Status: AC
  Administered 2014-07-23: 0.5 mg via ORAL
  Filled 2014-07-21: qty 1

## 2014-07-21 MED ORDER — ADULT MULTIVITAMIN W/MINERALS CH
1.0000 | ORAL_TABLET | Freq: Every day | ORAL | Status: DC
  Administered 2014-07-22 – 2014-07-24 (×3): 1 via ORAL
  Filled 2014-07-21 (×4): qty 1

## 2014-07-21 MED ORDER — CARVEDILOL 3.125 MG PO TABS
3.1250 mg | ORAL_TABLET | Freq: Two times a day (BID) | ORAL | Status: DC
  Administered 2014-07-21 – 2014-07-24 (×7): 3.125 mg via ORAL
  Filled 2014-07-21 (×8): qty 1

## 2014-07-21 MED ORDER — INSULIN ASPART 100 UNIT/ML ~~LOC~~ SOLN
0.0000 [IU] | Freq: Three times a day (TID) | SUBCUTANEOUS | Status: DC
  Administered 2014-07-22: 2 [IU] via SUBCUTANEOUS
  Administered 2014-07-22: 3 [IU] via SUBCUTANEOUS
  Administered 2014-07-22: 1 [IU] via SUBCUTANEOUS
  Administered 2014-07-23 (×3): 2 [IU] via SUBCUTANEOUS
  Administered 2014-07-24 (×2): 1 [IU] via SUBCUTANEOUS
  Administered 2014-07-24: 3 [IU] via SUBCUTANEOUS

## 2014-07-21 MED ORDER — SODIUM CHLORIDE 0.9 % IJ SOLN
3.0000 mL | Freq: Two times a day (BID) | INTRAMUSCULAR | Status: DC
  Administered 2014-07-23 – 2014-07-24 (×4): 3 mL via INTRAVENOUS

## 2014-07-21 MED ORDER — LORAZEPAM 1 MG PO TABS
1.0000 mg | ORAL_TABLET | Freq: Four times a day (QID) | ORAL | Status: DC | PRN
  Administered 2014-07-22: 1 mg via ORAL
  Filled 2014-07-21: qty 1

## 2014-07-21 MED ORDER — FLUTICASONE PROPIONATE HFA 110 MCG/ACT IN AERO
1.0000 | INHALATION_SPRAY | Freq: Two times a day (BID) | RESPIRATORY_TRACT | Status: DC
  Administered 2014-07-21 – 2014-07-24 (×6): 1 via RESPIRATORY_TRACT
  Filled 2014-07-21: qty 12

## 2014-07-21 MED ORDER — ATORVASTATIN CALCIUM 20 MG PO TABS
20.0000 mg | ORAL_TABLET | Freq: Every day | ORAL | Status: DC
  Administered 2014-07-22 – 2014-07-24 (×3): 20 mg via ORAL
  Filled 2014-07-21 (×3): qty 1

## 2014-07-21 MED ORDER — FLUTICASONE FUROATE 200 MCG/ACT IN AEPB: 1.0000 | INHALATION_SPRAY | Freq: Two times a day (BID) | RESPIRATORY_TRACT | Status: DC

## 2014-07-21 MED ORDER — SENNA 8.6 MG PO TABS
1.0000 | ORAL_TABLET | Freq: Two times a day (BID) | ORAL | Status: DC | PRN
  Administered 2014-07-23: 8.6 mg via ORAL
  Filled 2014-07-21: qty 1

## 2014-07-21 MED ORDER — LORAZEPAM 2 MG/ML IJ SOLN: 1.0000 mg | Freq: Four times a day (QID) | INTRAMUSCULAR | Status: DC | PRN

## 2014-07-21 MED ORDER — GABAPENTIN 300 MG PO CAPS
300.0000 mg | ORAL_CAPSULE | Freq: Every day | ORAL | Status: DC
  Administered 2014-07-22 – 2014-07-24 (×3): 300 mg via ORAL
  Filled 2014-07-21 (×3): qty 1

## 2014-07-21 MED ORDER — ACETAMINOPHEN 650 MG RE SUPP: 650.0000 mg | Freq: Four times a day (QID) | RECTAL | Status: DC | PRN

## 2014-07-21 MED ORDER — SODIUM CHLORIDE 0.9 % IJ SOLN
3.0000 mL | Freq: Two times a day (BID) | INTRAMUSCULAR | Status: DC
  Administered 2014-07-24: 3 mL via INTRAVENOUS

## 2014-07-21 MED ORDER — OXYCODONE HCL 5 MG PO TABS
10.0000 mg | ORAL_TABLET | Freq: Three times a day (TID) | ORAL | Status: DC | PRN
  Administered 2014-07-21 – 2014-07-23 (×7): 10 mg via ORAL
  Filled 2014-07-21 (×8): qty 2

## 2014-07-21 MED ORDER — SODIUM CHLORIDE 0.9 % IJ SOLN
3.0000 mL | INTRAMUSCULAR | Status: DC | PRN
  Administered 2014-07-22: 3 mL via INTRAVENOUS

## 2014-07-21 MED ORDER — SODIUM CHLORIDE 0.9 % IV SOLN: 250.0000 mL | INTRAVENOUS | Status: DC | PRN

## 2014-07-21 MED ORDER — LORAZEPAM 1 MG PO TABS: 0.0000 mg | ORAL_TABLET | Freq: Two times a day (BID) | ORAL | Status: DC

## 2014-07-21 MED ORDER — ASPIRIN 81 MG PO CHEW
81.0000 mg | CHEWABLE_TABLET | Freq: Every day | ORAL | Status: DC
  Administered 2014-07-22 – 2014-07-24 (×3): 81 mg via ORAL
  Filled 2014-07-21 (×3): qty 1

## 2014-07-21 MED ORDER — POTASSIUM CHLORIDE CRYS ER 20 MEQ PO TBCR
20.0000 meq | EXTENDED_RELEASE_TABLET | Freq: Every day | ORAL | Status: DC
  Administered 2014-07-22 – 2014-07-24 (×3): 20 meq via ORAL
  Filled 2014-07-21 (×3): qty 1

## 2014-07-21 MED ORDER — MORPHINE SULFATE 4 MG/ML IJ SOLN
4.0000 mg | Freq: Once | INTRAMUSCULAR | Status: AC
  Administered 2014-07-21: 4 mg via INTRAVENOUS
  Filled 2014-07-21: qty 1

## 2014-07-21 MED ORDER — FUROSEMIDE 10 MG/ML IJ SOLN
40.0000 mg | Freq: Once | INTRAMUSCULAR | Status: AC
  Administered 2014-07-21: 40 mg via INTRAVENOUS
  Filled 2014-07-21: qty 4

## 2014-07-21 MED ORDER — FOLIC ACID 1 MG PO TABS
1.0000 mg | ORAL_TABLET | Freq: Every day | ORAL | Status: DC
  Administered 2014-07-21 – 2014-07-24 (×4): 1 mg via ORAL
  Filled 2014-07-21 (×4): qty 1

## 2014-07-21 MED ORDER — VITAMIN B-1 100 MG PO TABS
100.0000 mg | ORAL_TABLET | Freq: Every day | ORAL | Status: DC
  Administered 2014-07-21 – 2014-07-24 (×4): 100 mg via ORAL
  Filled 2014-07-21 (×4): qty 1

## 2014-07-21 MED ORDER — OXYCODONE HCL 10 MG PO TABS: 10.0000 mg | ORAL_TABLET | Freq: Three times a day (TID) | ORAL | Status: DC | PRN

## 2014-07-21 MED ORDER — HEPARIN SODIUM (PORCINE) 5000 UNIT/ML IJ SOLN
5000.0000 [IU] | Freq: Three times a day (TID) | INTRAMUSCULAR | Status: DC
  Administered 2014-07-21 – 2014-07-24 (×8): 5000 [IU] via SUBCUTANEOUS
  Filled 2014-07-21 (×9): qty 1

## 2014-07-21 MED ORDER — INSULIN GLARGINE 100 UNIT/ML ~~LOC~~ SOLN
8.0000 [IU] | Freq: Every day | SUBCUTANEOUS | Status: DC
  Administered 2014-07-21 – 2014-07-23 (×3): 8 [IU] via SUBCUTANEOUS
  Filled 2014-07-21 (×4): qty 0.08

## 2014-07-21 MED ORDER — PANTOPRAZOLE SODIUM 40 MG PO TBEC
40.0000 mg | DELAYED_RELEASE_TABLET | Freq: Every day | ORAL | Status: DC
  Administered 2014-07-22 – 2014-07-24 (×3): 40 mg via ORAL
  Filled 2014-07-21 (×3): qty 1

## 2014-07-21 MED ORDER — ACETAMINOPHEN 325 MG PO TABS
650.0000 mg | ORAL_TABLET | Freq: Four times a day (QID) | ORAL | Status: DC | PRN
  Administered 2014-07-22 – 2014-07-23 (×4): 650 mg via ORAL
  Filled 2014-07-21 (×4): qty 2

## 2014-07-21 MED ORDER — FLUOXETINE HCL 20 MG PO CAPS
20.0000 mg | ORAL_CAPSULE | Freq: Every day | ORAL | Status: DC
  Administered 2014-07-22 – 2014-07-24 (×3): 20 mg via ORAL
  Filled 2014-07-21 (×3): qty 1

## 2014-07-21 NOTE — ED Notes (Signed)
EKG completed given to EDP.  

## 2014-07-21 NOTE — H&P (Signed)
[facility] Admission History and Physical Service Pager: [phone]  Patient name: [name] Medical record number: [id number] Date of birth: 10/20/1945 Age: 68 y.o. Gender: male  Primary Care Provider: Jene Every, MD Consultants: Cardiology Code Status: Full code  Chief Complaint: Dyspnea  Assessment and Plan: AUTREY HUMAN is a 68 y.o. male presenting with dyspnea . PMH is significant for heart failure with preserved EF, alcohol abuse, DM2, atrial fibrillation, CAD s/p CABG x4 and left CEA  # Dyspnea: Acute exacerbation. Differential includes CHF exacerbation, MI, pneumothorax, PE, COPD, metastasis of breast carcinoma. PE Wells score of 0, EKG unremarkable with initial negative troponin. Crackles on exam with clear chest x-ray. BNP elevated but patient seems to be consistently elevated (baseline appears to be 4000 per recent cardiology note). No wheezing on exam and per notes, no evidence of metastatic cancer. Patient fluid overloaded from exam and alcohol use probably a significant cause of exacerbation. Patient is s/p last 40mg  IV and is on 3L O2. Unsure of dry weight but patient is 2lbs less from when he was last seen 6 days ago (and asymptomatic). Weight on admission is 240lbs. - Admit to telemetry, admitting physician, Dr. Erin Hearing - Cardiology consulted in ED, will follow recommendations regarding diuresis. Appreciate input - daily weights - strict in/out - continue oxygen therapy to keep O2 sats >92%, wean to room air as needed during the day - continue home 2L of oxygen qhs - repeat Bmet in AM  # Unsteady gait: episode today only. Appears to not have lasted long. Patient someone unsteady when stood up in ED. No significant strength deficit on exam. Patient has a left foot drop on exam which appears to be chronic and patient has been walking with this disability prior. No bladder/bowel incontinence. - PT/OT consult - consider MRI of thoracic  spine if not improving  # HFpEF: Last EF 63% on Lexiscan stress test in August 2015 with normal study. Patient takes Lasix 160mg  BID and metolazone 5mg  once weekly on Mondays. Per Cardiology note on 9/1, patient has a dry weight of 270lbs (however patient has not been this weight since 2013) and baseline proBNP of 4000. - continue home Coreg 3.125mg  BID  # Alcohol abuse: heavy drinker. Drinks at least 4-8oz of liquor most days out of the week. Knows he needs to quit drinking. Drinking seems to have worsened since receiving diagnosis of breast cancer. - CIWA protocol - EtOH level - UDS - Social work consult  # Diabetes mellitus, type 2: last A1C on file of 6.7 on 04/2013. On Lantus 8 units qhs - Lantus 8u QHS - SSI sensitive - Hemoglobin A1c  # Atrial fibrillation: currently rate controlled. Per cardiology notes, not a candidate for anticoagulation because of alcohol abuse and high fall risk - continue coreg 3.125mg  - telemetery  # Coronary artery disease: history of CABG x4 in 2011, L CEA in 2011 - continue coreg 3.125mg  BID - continue aspirin 81mg  daily - continue atorvastatin 20mg  daily  # Chronic back pain - continue home oxycodone 10mg  q8hrs PRN  FEN/GI: SLIV, cardiac/Carb modified diet Prophylaxis: heparin subq  Disposition: Admit to telemetry  History of Present Illness: [name] is a 68 y.o. male presenting with increased dyspnea at rest. Symptoms started 2 days ago. Worsened last night. This morning he was not able to walk without feeling "wobbly" and off balance. He did not feel like falling in any particular direction. He has had sweats at night for the  past few days with no fever, nausea or vomiting. He has used flovent for his dyspnea and recently increased from 2L O2 at night 3L O2 at night 3 days ago. He has no runny nose or sneezing. He coughs only in the morning, producing white sputum. He has 2 pillow orthopnea and chronic leg swelling (left greater than right)  that has slightly worsened. He uses compressions stockings which have helped. He states he has not been as compliant with his diet and has had increased sodium intake. He has also been drinking heavier since his recent diagnosis of breast cancer.  In the ED, chest x-ray remarkable for mild cardiomegaly. CT head significant for progressive atrophy. Lumbar x-ray significant for degenerative changes. Cardiology consulted in ED and per discussion with ED physician, want to diurese and will follow while inpatient. He received morphine for back pain.  Review Of Systems: Per HPI with the following additions: Otherwise 12 point review of systems was performed and was unremarkable.  Patient Active Problem List   Diagnosis Date Noted  . Pre-operative cardiovascular examination 07/15/2014  . Family history of malignant neoplasm of gastrointestinal tract 06/12/2014  . Male breast cancer 06/12/2014  . Cancer of male breast, right 05/29/2014  . Acute on chronic renal failure 04/29/2014  . 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 Acute on chronic systolic and diastolic heart failure, NYHA class 4 04/20/2014  . Diabetes mellitus 01/28/2014  . Panniculitis 04/14/2013  . Acute on chronic diastolic heart failure 24/06/7352  . Seasonal allergies 12/20/2012  . L1 vertebral fracture 09/07/2012  . Acute renal failure 08/30/2012  . Hypothyroidism 08/30/2012  . ETOH abuse   . Atrial fibrillation   . Chronic diastolic heart failure   . DM (diabetes mellitus) type II uncontrolled, periph vascular disorder 06/20/2009  . Obstructive sleep apnea 06/20/2009  . CAROTID STENOSIS 06/20/2009  . CHRONIC OBSTRUCTIVE PULMONARY DISEASE 06/20/2009  . CAD 06/20/2009  . Hypderlipidemia   . Hypertensive heart disease    Past Medical History: Past Medical History  Diagnosis Date  . CHRONIC OBSTRUCTIVE PULMONARY DISEASE 06/20/2009  . OBSTRUCTIVE SLEEP APNEA 06/20/2009  . CAROTID STENOSIS  06/20/2009    A. 08/2001 s/p L CEA;  B.   09/14/11 - Carotid U/S - 40-59% bilateral stenosis, left CEA patch angioplasty is patent  . DM 06/20/2009  . CAD 06/20/2009    A.  08/2000 - s/p CABG x 4 - LIMA-LAD, Left Radial-OM, VG-DIAG, VG-RCA;  B. Neg. MV  2010  . HYPERLIPIDEMIA 06/20/2009  . HYPERTENSION 06/20/2009  . Hypothyroidism   . Low back pain   . Asthma     as child  . Pneumonia   . Atrial fibrillation     Not felt to be coumadin candidate 2/2 ETOH use.  Marland Kitchen ETOH abuse   . History of tobacco abuse     remote - quit 1970  . Bilateral renal cysts   . Marijuana abuse   . Morbidly obese   . CHF (congestive heart failure)   . Falls frequently   . Hx of cardiovascular stress test 05/2014    Lexiscan Myoview (8/15):  No ischemia; EF 63% - Normal Study  . Breast cancer, right breast    Past Surgical History: Past Surgical History  Procedure Laterality Date  . Carotid endarterectomy  2002    left  . Coronary artery bypass graft      x 4 - 2001  . Umbilical hernia repair N/A  01/28/2014    Procedure: HERNIA REPAIR UMBILICAL ADULT/INC;  Surgeon: Harl Bowie, MD;  Location: [place];  Service: General;  Laterality: N/A;  . Laparotomy N/A 01/28/2014    Procedure: EXPLORATORY LAPAROTOMY;  Surgeon: Harl Bowie, MD;  Location: [place];  Service: General;  Laterality: N/A;  . Bowel resection N/A 01/28/2014    Procedure: SMALL BOWEL RESECTION;  Surgeon: Harl Bowie, MD;  Location: [place];  Service: General;  Laterality: N/A;  . Hernia repair     Social History: History  Substance Use Topics  . Smoking status: Former Smoker    Quit date: 10/11/1968  . Smokeless tobacco: Not on file  . Alcohol Use: Yes     Comment: wild [place] daily;  also reports regularly drinking a pint of vodka.   Additional social history: Drinks Curator. Last drink yesterday Please also refer to relevant sections of EMR.  Family History: Family History  Problem Relation Age of Onset  . Stroke Mother      ?  Marland Kitchen Cancer Mother     unknown type of cancer  . Stroke Father     ?  Marland Kitchen Heart attack Neg Hx   . Cancer Brother 72    stomach cancer   Allergies and Medications: Allergies  Allergen Reactions  . Erythromycin Hives   No current facility-administered medications on file prior to encounter.   Current Outpatient Prescriptions on File Prior to Encounter  Medication Sig Dispense Refill  . aspirin 81 MG chewable tablet Chew 1 tablet (81 mg total) by mouth daily.  30 tablet  1  . atorvastatin (LIPITOR) 20 MG tablet Take 20 mg by mouth daily.      . carvedilol (COREG) 3.125 MG tablet Take 1 tablet (3.125 mg total) by mouth 2 (two) times daily with a meal.  60 tablet  5  . colchicine 0.6 MG tablet Take 0.6 mg by mouth daily as needed (for gout).      Marland Kitchen FLUoxetine (PROZAC) 20 MG capsule Take 20 mg by mouth daily.        . furosemide (LASIX) 80 MG tablet Take 2 tablets (160 mg total) by mouth 2 (two) times daily.  120 tablet  5  . gabapentin (NEURONTIN) 300 MG capsule Take 1 capsule (300 mg total) by mouth daily.      . Insulin Glargine (LANTUS SOLOSTAR) 100 UNIT/ML Solostar Pen Inject 16 Units into the skin at bedtime.       Marland Kitchen lactulose (CHRONULAC) 10 GM/15ML solution Take 20 g by mouth 2 (two) times daily.      Marland Kitchen levothyroxine (SYNTHROID, LEVOTHROID) 150 MCG tablet Take 150 mcg by mouth at bedtime.       . metolazone (ZAROXOLYN) 5 MG tablet Take one tablet by mouth 30 minutes before AM Lasix on Monday. Take extra potassium tab on metolazone day.  15 tablet  3  . Multiple Vitamin (MULTIVITAMIN WITH MINERALS) TABS tablet Take 1 tablet by mouth daily.      Marland Kitchen omeprazole (PRILOSEC) 20 MG capsule Take 20 mg by mouth daily.        . Oxycodone HCl 10 MG TABS Take 10 mg by mouth every 8 (eight) hours as needed (for pain).      . Potassium Chloride ER 20 MEQ TBCR Take 20-40 mEq by mouth daily. 1 TABLET DAILY, EXCEPT ON MONDAYS TAKE 2      . senna (SENOKOT) 8.6 MG TABS tablet Take 1 tablet by mouth 2  (two) times  daily as needed for mild constipation.      Marland Kitchen glucose blood (BAYER CONTOUR TEST) test strip 1 each by Other route as needed. USE TO TEST BLOOD SUGAR TWICE DAILY        Objective: BP 174/89  Pulse 72  Temp(Src) 97.2 F (36.2 C) (Oral)  Resp 20  Ht 5\' 7"  (1.702 m)  Wt 240 lb (108.863 kg)  BMI 37.58 kg/m2  SpO2 100%  Exam: General: Well appearing, in no distress HEENT: PERRLA, EOMI, no jaundice, dry mucous membranes, no cervical adenopathy Cardiovascular: Irregular rhythm, regular rate, no murmurs Respiratory: bibasilar crackles on exam with decreased breath sounds at bases. No wheezing Abdomen: Soft, non-tender, non-distended, obese Extremities: Significant edema of lower extremities with left greater than right. Legs are erythematous and Skin: Erythema of bilateral legs, warm and well perfused Neuro: Alert, oriented x3, CN intact, 4/5 grip and upper extremity strength, 4/5 bilateral lower extremity strength. Normal plantar flexion with good strength. Absent dorsiflexion of left foot. Patient stood up, supporting his weight on his walker with adequate stability and did not not attempt to walk.  Labs and Imaging: CBC BMET   Recent Labs Lab 07/21/14 1300  WBC 5.4  HGB 12.8*  HCT 38.4*  PLT 129*    Recent Labs Lab 07/21/14 1300  NA 139  K 3.7  CL 94*  CO2 34*  BUN 74*  CREATININE 1.09  GLUCOSE 210*  CALCIUM 9.5     Dg Chest 2 View (if Patient Has Fever And/or Copd)  07/21/2014   CLINICAL DATA:  Two day history of shortness of breath  EXAM: CHEST  2 VIEW  COMPARISON:  April 20, 2014  FINDINGS: There is no appreciable edema or consolidation. Heart is mildly enlarged with pulmonary vascularity within normal limits. Patient is status post coronary artery bypass grafting. No adenopathy. No bone lesions.  IMPRESSION: Mild cardiac enlargement.  No edema or consolidation.   Electronically Signed   By: Lowella Grip M.D.   On: 07/21/2014 14:10   Dg Lumbar Spine  Complete  07/21/2014   CLINICAL DATA:  Pain and generalized weakness  EXAM: LUMBAR SPINE - COMPLETE 4+ VIEW  COMPARISON:  None.  FINDINGS: Frontal, lateral, spot lumbosacral lateral, and bilateral oblique views were obtained. There are 5 non-rib-bearing lumbar type vertebral bodies. There is mild lower thoracic and lumbar levoscoliosis. There is no fracture. There is slight retrolisthesis of L4 on L5, felt to be due to underlying spondylosis. No other spondylolisthesis is present. There is marked disc space narrowing at L4-5 and L5-S1. Vacuum phenomenon is noted at L3-4, L4-5, and L5-S1. There is moderate disc space narrowing at L1-2 and L2-3 is well. There is mild narrowing at L3-4. There are bridging anterior osteophytes consistent with diffuse idiopathic skeletal hyperostosis. There is facet osteoarthritic change at all levels bilaterally.  IMPRESSION: Extensive spondylosis and osteoarthritic change. 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 Slight spondylolisthesis at L4-5 is felt to be due to underlying spondylosis. No acute fracture. Mild scoliosis.   Electronically Signed   By: Lowella Grip M.D.   On: 07/21/2014 14:13   Ct Head Wo Contrast  07/21/2014   CLINICAL DATA:  Dizziness. Recent frequent falls. Generalized weakness. Bilateral lower extremity edema. Current history of diabetes and coronary artery disease. Prior carotid endarterectomy in 2002.  EXAM: CT HEAD WITHOUT CONTRAST  TECHNIQUE: Contiguous axial images were obtained from the base of the skull through the vertex without intravenous contrast.  COMPARISON:  Multiple prior head CTs dating  back to 04/05/2010, most recently 03/05/2014.  FINDINGS: Moderate cortical and deep atrophy, progressive since 2011. Mild changes of small vessel disease of the white matter, unchanged. Physiologic calcifications in the basal ganglia, unchanged. No mass lesion. No midline shift. No acute hemorrhage or hematoma. No extra-axial fluid collections.  No evidence of acute infarction.  No skull fracture or other focal osseous abnormality involving the skull. Small mucous retention cyst or polyp in the right maxillary sinus. Remaining visualized paranasal sinuses, bilateral mastoid air cells and bilateral middle ear cavities well-aerated. Extensive bilateral carotid siphon and left vertebral artery atherosclerosis.  IMPRESSION: 1. No acute intracranial abnormality. 2. Moderate generalized atrophy, progressive since 2011. Stable mild chronic microvascular ischemic changes of the white matter.   Electronically Signed   By: Evangeline Dakin M.D.   On: 07/21/2014 14:23    Cordelia Poche, MD 07/21/2014, 3:18 PM PGY-2, [place] [facility] pager: [phone], text pages welcome

## 2014-07-21 NOTE — ED Notes (Signed)
Admit Doctor at bedside.  

## 2014-07-21 NOTE — ED Provider Notes (Signed)
CSN: [id number]     Arrival date & time 07/21/14  1231 History   First MD Initiated Contact with Patient 07/21/14 1244     Chief Complaint  Patient presents with  . Shortness of Breath  . Leg Swelling  . Fatigue     (Consider location/radiation/quality/duration/timing/severity/associated sxs/prior Treatment) HPI Comments: Patient from home with 2 day history of shortness of breath, leg swelling and generalized weakness. Reports acute and chronic back pain that has worsened over the past 2 days to the point where he can't sleep.  Slept in recliner last night. Out of his pain medications and took a friend's medicines last night. States he's been noncompliant in the past 2 days. History of CHF, COPD, sleep apnea, atrial fibrillation. He is not anticoagulated. He endorses dry cough and dyspnea with any movement. No chest pain.  The history is provided by the patient and the EMS personnel.    Past Medical History  Diagnosis Date  . CHRONIC OBSTRUCTIVE PULMONARY DISEASE 06/20/2009  . OBSTRUCTIVE SLEEP APNEA 06/20/2009  . CAROTID STENOSIS 06/20/2009    A. 08/2001 s/p L CEA;  B.   09/14/11 - Carotid U/S - 40-59% bilateral stenosis, left CEA patch angioplasty is patent  . DM 06/20/2009  . CAD 06/20/2009    A.  08/2000 - s/p CABG x 4 - LIMA-LAD, Left Radial-OM, VG-DIAG, VG-RCA;  B. Neg. MV  2010  . HYPERLIPIDEMIA 06/20/2009  . HYPERTENSION 06/20/2009  . Hypothyroidism   . Low back pain   . Asthma     as child  . Pneumonia   . Atrial fibrillation     Not felt to be coumadin candidate 2/2 ETOH use.  Marland Kitchen ETOH abuse   . History of tobacco abuse     remote - quit 1970  . Bilateral renal cysts   . Marijuana abuse   . Morbidly obese   . CHF (congestive heart failure)   . Falls frequently   . Hx of cardiovascular stress test 05/2014    Lexiscan Myoview (8/15):  No ischemia; EF 63% - Normal Study  . Breast cancer, right breast    Past Surgical History  Procedure Laterality Date  . Carotid  endarterectomy  2002    left  . Coronary artery bypass graft      x 4 - 2001  . Umbilical hernia repair N/A 01/28/2014    Procedure: HERNIA REPAIR UMBILICAL ADULT/INC;  Surgeon: Harl Bowie, MD;  Location: [place];  Service: General;  Laterality: N/A;  . Laparotomy N/A 01/28/2014    Procedure: EXPLORATORY LAPAROTOMY;  Surgeon: Harl Bowie, MD;  Location: [place];  Service: General;  Laterality: N/A;  . Bowel resection N/A 01/28/2014    Procedure: SMALL BOWEL RESECTION;  Surgeon: Harl Bowie, MD;  Location: [place];  Service: General;  Laterality: N/A;  . Hernia repair     Family History  Problem Relation Age of Onset  . Stroke Mother     ?  Marland Kitchen Cancer Mother     unknown type of cancer  . Stroke Father     ?  Marland Kitchen Heart attack Neg Hx   . Cancer Brother 50    stomach cancer   History  Substance Use Topics  . Smoking status: Former Smoker    Quit date: 10/11/1968  . Smokeless tobacco: Not on file  . Alcohol Use: Yes     Comment: wild [place] daily;  also reports regularly drinking a pint of vodka.  Review of Systems  Constitutional: Positive for fever, activity change, appetite change and fatigue.  HENT: Negative for congestion and rhinorrhea.   Eyes: Negative for visual disturbance.  Respiratory: Positive for shortness of breath. Negative for chest tightness.   Cardiovascular: Negative for chest pain.  Gastrointestinal: Negative for nausea, vomiting and abdominal pain.  Genitourinary: Negative for dysuria, hematuria and testicular pain.  Musculoskeletal: Positive for arthralgias, back pain, joint swelling and myalgias.  Skin: Negative for rash.  Neurological: Positive for weakness. Negative for dizziness and headaches.   A complete 10 system review of systems was obtained and all systems are negative except as noted in the HPI and PMH.     Allergies  Erythromycin  Home Medications   Prior to Admission medications   Medication Sig Start Date End Date  Taking? Authorizing Provider  aspirin 81 MG chewable tablet Chew 1 tablet (81 mg total) by mouth daily. 02/05/14  Yes Kelvin Cellar, MD  atorvastatin (LIPITOR) 20 MG tablet Take 20 mg by mouth daily.   Yes Historical Provider, MD  carvedilol (COREG) 3.125 MG tablet Take 1 tablet (3.125 mg total) by mouth 2 (two) times daily with a meal. 04/29/14  Yes Brittainy Erie Noe, PA-C  colchicine 0.6 MG tablet Take 0.6 mg by mouth daily as needed (for gout).   Yes Historical Provider, MD  Dimethicone (MOISTURE BARRIER EX) Apply 1 application topically daily as needed (Gout under stomach). Apply after showers   Yes Historical Provider, MD  FLUoxetine (PROZAC) 20 MG capsule Take 20 mg by mouth daily.     Yes Historical Provider, MD  Fluticasone Furoate 200 MCG/ACT AEPB Inhale 1 puff into the lungs 2 (two) times daily.   Yes Historical Provider, MD  furosemide (LASIX) 80 MG tablet Take 2 tablets (160 mg total) by mouth 2 (two) times daily. 04/29/14  Yes Brittainy Erie Noe, PA-C  gabapentin (NEURONTIN) 300 MG capsule Take 1 capsule (300 mg total) by mouth daily. 09/07/12  Yes Srikar Janna Arch, MD  Insulin Glargine (LANTUS SOLOSTAR) 100 UNIT/ML Solostar Pen Inject 16 Units into the skin at bedtime.    Yes Historical Provider, MD  lactulose (CHRONULAC) 10 GM/15ML solution Take 20 g by mouth 2 (two) times daily.   Yes Historical Provider, MD  levothyroxine (SYNTHROID, LEVOTHROID) 150 MCG tablet Take 150 mcg by mouth at bedtime.    Yes Historical Provider, MD  metolazone (ZAROXOLYN) 5 MG tablet Take one tablet by mouth 30 minutes before AM Lasix on Monday. Take extra potassium tab on metolazone day. 05/31/14  Yes Larey Dresser, MD  Multiple Vitamin (MULTIVITAMIN WITH MINERALS) TABS tablet Take 1 tablet by mouth daily.   Yes Historical Provider, MD  omeprazole (PRILOSEC) 20 MG capsule Take 20 mg by mouth daily.     Yes Historical Provider, MD  Oxycodone HCl 10 MG TABS Take 10 mg by mouth every 8 (eight) hours as needed  (for pain).   Yes Historical Provider, MD  Potassium Chloride ER 20 MEQ TBCR Take 20-40 mEq by mouth daily. 1 TABLET DAILY, EXCEPT ON MONDAYS TAKE 2 05/14/14  Yes Scott T Weaver, PA-C  senna (SENOKOT) 8.6 MG TABS tablet Take 1 tablet by mouth 2 (two) times daily as needed for mild constipation.   Yes Historical Provider, MD  glucose blood (BAYER CONTOUR TEST) test strip 1 each by Other route as needed. USE TO TEST BLOOD SUGAR TWICE DAILY 01/23/14   Historical Provider, MD   BP 135/69  Pulse 68  Temp(Src) 97.8  F (36.6 C) (Oral)  Resp 20  Ht 5\' 7"  (1.702 m)  Wt 239 lb 14.4 oz (108.818 kg)  BMI 37.56 kg/m2  SpO2 97% Physical Exam  Nursing note and vitals reviewed. Constitutional: He is oriented to person, place, and time. He appears well-developed and well-nourished. No distress.  HENT:  Head: Normocephalic and atraumatic.  Mouth/Throat: Oropharynx is clear and moist. No oropharyngeal exudate.  Eyes: Conjunctivae and EOM are normal. Pupils are equal, round, and reactive to light.  Neck: Normal range of motion. Neck supple. JVD present.  No meningismus.  Cardiovascular: Normal rate, regular rhythm, normal heart sounds and intact distal pulses.   No murmur heard. Pulmonary/Chest: Effort normal and breath sounds normal. No respiratory distress.  Rales at bases  Abdominal: Soft. There is no tenderness. There is no rebound and no guarding.  Musculoskeletal: Normal range of motion. He exhibits edema and tenderness.  Pitting edema to thighs bilaterally TTP lumbar spine  Neurological: He is alert and oriented to person, place, and time. No cranial nerve deficit. He exhibits normal muscle tone. Coordination normal.  No ataxia on finger to nose bilaterally. No pronator drift. 5/5 strength throughout except for stable L foot drop. CN 2-12 intact. Equal grip strength. Sensation intact.  Skin: Skin is warm.  Psychiatric: He has a normal mood and affect. His behavior is normal.    ED Course   Procedures (including critical care time) Labs Review Labs Reviewed  CBC - Abnormal; Notable for the following:    RBC 4.08 (*)    Hemoglobin 12.8 (*)    HCT 38.4 (*)    RDW 16.4 (*)    Platelets 129 (*)    All other components within normal limits  PRO B NATRIURETIC PEPTIDE - Abnormal; Notable for the following:    Pro B Natriuretic peptide (BNP) 3542.0 (*)    All other components within normal limits  COMPREHENSIVE METABOLIC PANEL - Abnormal; Notable for the following:    Chloride 94 (*)    CO2 34 (*)    Glucose, Bld 210 (*)    BUN 74 (*)    Albumin 3.1 (*)    GFR calc non Af Amer 67 (*)    GFR calc Af Amer 77 (*)    All other components within normal limits  URINALYSIS, ROUTINE W REFLEX MICROSCOPIC - Abnormal; Notable for the following:    Protein, ur 30 (*)    All other components within normal limits  URINE MICROSCOPIC-ADD ON  I-STAT TROPOININ, ED    Imaging Review Dg Chest 2 View (if Patient Has Fever And/or Copd)  07/21/2014   CLINICAL DATA:  Two day history of shortness of breath  EXAM: CHEST  2 VIEW  COMPARISON:  April 20, 2014  FINDINGS: There is no appreciable edema or consolidation. Heart is mildly enlarged with pulmonary vascularity within normal limits. Patient is status post coronary artery bypass grafting. No adenopathy. No bone lesions.  IMPRESSION: Mild cardiac enlargement.  No edema or consolidation.   Electronically Signed   By: Lowella Grip M.D.   On: 07/21/2014 14:10   Dg Lumbar Spine Complete  07/21/2014   CLINICAL DATA:  Pain and generalized weakness  EXAM: LUMBAR SPINE - COMPLETE 4+ VIEW  COMPARISON:  None.  FINDINGS: Frontal, lateral, spot lumbosacral lateral, and bilateral oblique views were obtained. There are 5 non-rib-bearing lumbar type vertebral bodies. There is mild lower thoracic and lumbar levoscoliosis. There is no fracture. There is slight retrolisthesis of L4 on L5, felt  to be due to underlying spondylosis. No other spondylolisthesis is  present. There is marked disc space narrowing at L4-5 and L5-S1. Vacuum phenomenon is noted at L3-4, L4-5, and L5-S1. There is moderate disc space narrowing at L1-2 and L2-3 is well. There is mild narrowing at L3-4. There are bridging anterior osteophytes consistent with diffuse idiopathic skeletal hyperostosis. There is facet osteoarthritic change at all levels bilaterally.  IMPRESSION: Extensive spondylosis and osteoarthritic change. 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 Slight spondylolisthesis at L4-5 is felt to be due to underlying spondylosis. No acute fracture. Mild scoliosis.   Electronically Signed   By: Lowella Grip M.D.   On: 07/21/2014 14:13   Ct Head Wo Contrast  07/21/2014   CLINICAL DATA:  Dizziness. Recent frequent falls. Generalized weakness. Bilateral lower extremity edema. Current history of diabetes and coronary artery disease. Prior carotid endarterectomy in 2002.  EXAM: CT HEAD WITHOUT CONTRAST  TECHNIQUE: Contiguous axial images were obtained from the base of the skull through the vertex without intravenous contrast.  COMPARISON:  Multiple prior head CTs dating back to 04/05/2010, most recently 03/05/2014.  FINDINGS: Moderate cortical and deep atrophy, progressive since 2011. Mild changes of small vessel disease of the white matter, unchanged. Physiologic calcifications in the basal ganglia, unchanged. No mass lesion. No midline shift. No acute hemorrhage or hematoma. No extra-axial fluid collections. No evidence of acute infarction.  No skull fracture or other focal osseous abnormality involving the skull. Small mucous retention cyst or polyp in the right maxillary sinus. Remaining visualized paranasal sinuses, bilateral mastoid air cells and bilateral middle ear cavities well-aerated. Extensive bilateral carotid siphon and left vertebral artery atherosclerosis.  IMPRESSION: 1. No acute intracranial abnormality. 2. Moderate generalized atrophy, progressive since 2011.  Stable mild chronic microvascular ischemic changes of the white matter.   Electronically Signed   By: Evangeline Dakin M.D.   On: 07/21/2014 14:23     EKG Interpretation   Date/Time:  Sunday July 21 2014 12:37:26 EDT Ventricular Rate:  71 PR Interval:    QRS Duration: 97 QT Interval:  458 QTC Calculation: 498 R Axis:   90 Text Interpretation:  Atrial fibrillation Ventricular tachycardia,  unsustained Borderline right axis deviation Borderline prolonged QT  interval Artifact Confirmed by Wyvonnia Dusky  MD, Zeph Riebel [phone]) on 07/21/2014  12:58:53 PM      MDM   Final diagnoses:  Peripheral edema  Acute systolic congestive heart failure   SOB with worsening peripheral edema, unable to ambulate.  Admits noncompliance and alcohol use.  No Chest pain.  Atrial fib on EKG.    Some rales at bases, no frank edema on xray.  BNP 3500. Creatinine stable.  Dose IV lasix. CT head negative. Neuro exam nonfocal.   Dyspnea likely from CHF exacerbation. PE considered given recent breast CA diagnosis but no tachycardia or hypoxia.  D/w Dr. Harrington Challenger.  She will consult but requests medical admission given ongoing issues. D/w dr. Lonny Prude.     Ezequiel Essex, MD 07/21/14 1945

## 2014-07-21 NOTE — ED Notes (Signed)
Assisted patient with another nurse and walker to standing patient. Attempted to use urinal in bed could not and stated the need to stand.  During patient stated forgot to mention past two days intermittent "swimmy headed"  Patient stood with assistance sat down briefly short of breath along with swimmy headed. Lasting one minute.  EDP notified.

## 2014-07-21 NOTE — ED Notes (Signed)
Onset two days ago shortness of breath intermittently and today increased general weakness and increased bilateral lower extremity edema. Ran out of pain medication beginning of this week and took another persons oxycodone one day ago for chronic back and bilateral leg pain.

## 2014-07-21 NOTE — ED Notes (Signed)
Pt returned from Radiology, in NAD.

## 2014-07-22 DIAGNOSIS — I5032 Chronic diastolic (congestive) heart failure: Secondary | ICD-10-CM

## 2014-07-22 DIAGNOSIS — R2681 Unsteadiness on feet: Secondary | ICD-10-CM

## 2014-07-22 DIAGNOSIS — M109 Gout, unspecified: Secondary | ICD-10-CM

## 2014-07-22 LAB — GLUCOSE, CAPILLARY
GLUCOSE-CAPILLARY: 159 mg/dL — AB (ref 70–99)
Glucose-Capillary: 186 mg/dL — ABNORMAL HIGH (ref 70–99)
Glucose-Capillary: 141 mg/dL — ABNORMAL HIGH (ref 70–99)
Glucose-Capillary: 213 mg/dL — ABNORMAL HIGH (ref 70–99)
Glucose-Capillary: 203 mg/dL — ABNORMAL HIGH (ref 70–99)

## 2014-07-22 LAB — SEDIMENTATION RATE: Sed Rate: 48 mm/hr — ABNORMAL HIGH (ref 0–16)

## 2014-07-22 LAB — BASIC METABOLIC PANEL
Anion gap: 12 (ref 5–15)
BUN: 64 mg/dL — AB (ref 6–23)
CO2: 33 mEq/L — ABNORMAL HIGH (ref 19–32)
Calcium: 9.3 mg/dL (ref 8.4–10.5)
Chloride: 99 mEq/L (ref 96–112)
Creatinine, Ser: 1 mg/dL (ref 0.50–1.35)
GFR calc Af Amer: 86 mL/min — ABNORMAL LOW (ref >90)
GFR, EST NON AFRICAN AMERICAN: 74 mL/min — AB (ref >90)
Glucose, Bld: 174 mg/dL — ABNORMAL HIGH (ref 70–99)
Potassium: 3.7 mEq/L (ref 3.7–5.3)
SODIUM: 144 meq/L (ref 137–147)

## 2014-07-22 LAB — URIC ACID: URIC ACID, SERUM: 11.1 mg/dL — AB (ref 4.0–7.8)

## 2014-07-22 LAB — HEMOGLOBIN A1C
Hgb A1c MFr Bld: 6.8 % — ABNORMAL HIGH (ref <5.7)
Mean Plasma Glucose: 148 mg/dL — ABNORMAL HIGH (ref <117)

## 2014-07-22 LAB — CBC
HCT: 37.7 % — ABNORMAL LOW (ref 39.0–52.0)
Hemoglobin: 11.9 g/dL — ABNORMAL LOW (ref 13.0–17.0)
MCH: 30.3 pg (ref 26.0–34.0)
MCHC: 31.6 g/dL (ref 30.0–36.0)
MCV: 95.9 fL (ref 78.0–100.0)
PLATELETS: 145 10*3/uL — AB (ref 150–400)
RBC: 3.93 MIL/uL — ABNORMAL LOW (ref 4.22–5.81)
RDW: 16.4 % — AB (ref 11.5–15.5)
WBC: 7.3 10*3/uL (ref 4.0–10.5)

## 2014-07-22 LAB — TSH: TSH: 5.74 u[IU]/mL — ABNORMAL HIGH (ref 0.350–4.500)

## 2014-07-22 LAB — RHEUMATOID FACTOR

## 2014-07-22 MED ORDER — COLCHICINE 0.6 MG PO TABS
0.6000 mg | ORAL_TABLET | Freq: Two times a day (BID) | ORAL | Status: DC
  Administered 2014-07-22 – 2014-07-24 (×5): 0.6 mg via ORAL
  Filled 2014-07-22 (×6): qty 1

## 2014-07-22 MED ORDER — PREDNISONE 20 MG PO TABS
40.0000 mg | ORAL_TABLET | Freq: Every day | ORAL | Status: AC
  Administered 2014-07-22 – 2014-07-24 (×3): 40 mg via ORAL
  Filled 2014-07-22 (×3): qty 2

## 2014-07-22 MED ORDER — FUROSEMIDE 10 MG/ML IJ SOLN
80.0000 mg | Freq: Two times a day (BID) | INTRAMUSCULAR | Status: DC
  Administered 2014-07-22: 80 mg via INTRAVENOUS
  Filled 2014-07-22: qty 8

## 2014-07-22 MED ORDER — FUROSEMIDE 80 MG PO TABS
160.0000 mg | ORAL_TABLET | Freq: Two times a day (BID) | ORAL | Status: DC
  Administered 2014-07-22 – 2014-07-24 (×5): 160 mg via ORAL
  Filled 2014-07-22 (×6): qty 2

## 2014-07-22 MED ORDER — LEVOTHYROXINE SODIUM 150 MCG PO TABS
150.0000 ug | ORAL_TABLET | Freq: Every day | ORAL | Status: DC
  Administered 2014-07-22 – 2014-07-23 (×2): 150 ug via ORAL
  Filled 2014-07-22 (×3): qty 1

## 2014-07-22 NOTE — Evaluation (Signed)
Physical Therapy Evaluation Patient Details Name: [name] MRN: [id number] DOB: 1945-10-20 Today's Date: 07/22/2014   History of Present Illness  Pt is a 68 y/o male admitted s/p acute on chronic systolic and diastolic heart failure, NYHA class 4.   Clinical Impression  Pt admitted with/for CHF.  Pt currently limited functionally due to the problems listed. ( See problems list.)   Pt will benefit from PT to maximize function and safety in order to get ready for next venue listed below.     Follow Up Recommendations CIR    Equipment Recommendations  None recommended by PT;Other (comment) (TBA)    Recommendations for Other Services Rehab consult     Precautions / Restrictions Precautions Precautions: None Restrictions Weight Bearing Restrictions: No      Mobility  Bed Mobility               General bed mobility comments: did not do bed mobility, pt stayed in the chair  Transfers Overall transfer level: Needs assistance Equipment used: Rolling walker (2 wheeled) Transfers: Sit to/from Stand Sit to Stand: Mod assist         General transfer comment: cues for hand placement and to get pt positioned for symmetrical stand.  Ambulation/Gait             General Gait Details: see pre-gait comments  Stairs            Wheelchair Mobility    Modified Rankin (Stroke Patients Only)       Balance Overall balance assessment: Needs assistance Sitting-balance support: No upper extremity supported;Single extremity supported Sitting balance-Leahy Scale: Fair     Standing balance support: Bilateral upper extremity supported Standing balance-Leahy Scale: Poor Standing balance comment: stood edge of chair x2 for ~4 minutes each trial working on finding and holding midline (pt's sense of midline squewed to the Left about 10*),  working on w/shift and stepping forward and back each foot.  Noted ataxia L >R and as stated before heavy list to the left.                              Pertinent Vitals/Pain Pain Assessment: No/denies pain Pain Score: 10-Worst pain ever Pain Location: back    Home Living Family/patient expects to be discharged to:: Private residence Living Arrangements: Children (daughter and son-in-law) Available Help at Discharge: Family Type of Home: House Home Access: Stairs to enter Entrance Stairs-Rails: Can reach both Entrance Stairs-Number of Steps: 3 Home Layout: One level Home Equipment: Walker - 2 wheels;Grab bars - toilet;Grab bars - tub/shower      Prior Function Level of Independence: Independent with assistive device(s)               Hand Dominance   Dominant Hand: Right    Extremity/Trunk Assessment   Upper Extremity Assessment: Defer to OT evaluation           Lower Extremity Assessment: RLE deficits/detail;LLE deficits/detail RLE Deficits / Details: grossly >4/5 except trace df LLE Deficits / Details: grossly >4/5  Cervical / Trunk Assessment: Normal  Communication   Communication: No difficulties  Cognition Arousal/Alertness: Awake/alert Behavior During Therapy: Anxious Overall Cognitive Status: Within Functional Limits for tasks assessed       Memory: Decreased short-term memory              General Comments General comments (skin integrity, edema, etc.): Had TEDs on and quite edematous underneath.  Exercises        Assessment/Plan    PT Assessment Patient needs continued PT services  PT Diagnosis Difficulty walking   PT Problem List Decreased strength;Decreased activity tolerance;Decreased balance;Decreased mobility;Decreased coordination;Cardiopulmonary status limiting activity;Pain  PT Treatment Interventions DME instruction;Gait training;Functional mobility training;Therapeutic activities;Balance training;Patient/family education;Neuromuscular re-education   PT Goals (Current goals can be found in the Care Plan section) Acute Rehab PT  Goals Patient Stated Goal: to go home PT Goal Formulation: With patient Time For Goal Achievement: 08/05/14 Potential to Achieve Goals: Good    Frequency Min 4X/week   Barriers to discharge        Co-evaluation               End of Session Equipment Utilized During Treatment: Gait belt;Oxygen Activity Tolerance: Patient tolerated treatment well;Other (comment) (fearful of falling) Patient left: in chair;with call bell/phone within reach Nurse Communication: Mobility status         Time: 5868-2574 PT Time Calculation (min): 23 min   Charges:   PT Evaluation $Initial PT Evaluation Tier I: 1 Procedure PT Treatments $Therapeutic Activity: 8-22 mins   PT G Codes:          Glenisha Gundry, Tessie Fass 07/22/2014, 5:26 PM  07/22/2014  Donnella Sham, [place] [phone]  (pager)

## 2014-07-22 NOTE — Progress Notes (Signed)
Rehab Admissions Coordinator Note:  Patient was screened by Ltanya Bayley L for appropriateness for an Inpatient Acute Rehab Consult.  At this time, we are recommending Inpatient Rehab consult.  Mung Rinker L 07/22/2014, 2:55 PM  I can be reached at [phone].

## 2014-07-22 NOTE — Progress Notes (Signed)
FMTS Attending Daily Note:  [name] Sabal MD  [phone] pager  Family Practice pager:  [phone] I have discussed this patient with the resident Dr. Gerarda Fraction and attending physician Dr. Erin Hearing.  Sounds like combination of both heart failure (dyspnea) and Right leg pain/weakness.  HF Team ordered gout/rheum labs for pain.  Near his baseline weight as far as heart failure.  Concern is for hypothyroidism, has not had TSH in >1 year.  Has not been taking meds at home, unclear for how long.  Would not want to restart Synthroid at 150 mcg if he's been off this for >3 months.  Need to clarify.  Agree with TED/leg elevation.

## 2014-07-22 NOTE — Progress Notes (Signed)
UR Completed Tayten Heber Graves-Bigelow, RN,BSN [phone]  

## 2014-07-22 NOTE — H&P (Signed)
Family Medicine Teaching Service Attending Note  I interviewed and examined patient [name] and reviewed their tests and x-rays.  I discussed with Dr. Teryl Lucy and reviewed their note for today.  I agree with their assessment and plan.     Additionally  Alert no acute distress Oriented x 3 He feels his breathing is about baseline.  Legs are a little more swollen than usual.  Main concern is trouble moving with his left leg for last 3 days or so  "Feels weak" Lungs - crackles mid way up Left leg - foot drop he relates is old.  I did not try to walk Admits he should stop drinking   Recommend Cardiology to give suggestions about fluid Physical Therapy evalution for gait Offer alcohol rehab  Watch for DTs

## 2014-07-22 NOTE — Care Management Note (Signed)
    Page 1 of 1   07/24/2014     11:21:31 AM CARE MANAGEMENT NOTE 07/24/2014  Patient:  KEMAURI, MUSA   Account Number:  [id number]  Date Initiated:  07/22/2014  Documentation initiated by:  GRAVES-BIGELOW,Michiel Sivley  Subjective/Objective Assessment:   Pt admitted for CHF exacerbation.     Action/Plan:   CM to continue to monitor for disposition needs.   Anticipated DC Date:  07/24/2014   Anticipated DC Plan:  [place]  CM consult      Choice offered to / List presented to:             Status of service:  Completed, signed off Medicare Important Message given?  YES (If response is "NO", the following Medicare IM given date fields will be blank) Date Medicare IM given:  07/24/2014 Medicare IM given by:  GRAVES-BIGELOW,Tjay Velazquez Date Additional Medicare IM given:   Additional Medicare IM given by:    Discharge Disposition:    Per UR Regulation:  Reviewed for med. necessity/level of care/duration of stay  If discussed at [place] of Stay Meetings, dates discussed:    Comments:  Plan for admittsion to CIR today. No further needs from CM at this time. Jacqlyn Krauss, RN,BSN [phone]

## 2014-07-22 NOTE — Evaluation (Addendum)
Occupational Therapy Evaluation Patient Details Name: [name] MRN: [id number] DOB: 1946/02/25 Today's Date: 07/22/2014    History of Present Illness Pt is a 68 y/o male admitted s/p acute on chronic systolic and diastolic heart failure, NYHA class 4.    Clinical Impression   Patient admitted with above. Patient modified independent PTA. Patient currently functioning at an overall mod assist > total +2 for functional mobility and self-care tasks. Please see OT problem list below. Feel patient will benefit from acute OT to increase overall independence in the areas of ADLs & functional mobility and in order to safely discharge to venue listed below.     Follow Up Recommendations  CIR;Supervision/Assistance - 24 hour    Equipment Recommendations   (defer to next venue of care)    Recommendations for Other Services Rehab consult     Precautions / Restrictions Precautions Precautions: None Restrictions Weight Bearing Restrictions: No      Mobility Bed Mobility Overal bed mobility: Needs Assistance Bed Mobility: Rolling;Supine to Sit Rolling: Min assist   Supine to sit: Min assist     General bed mobility comments: Patient with increased anxiety during transitional movements "you better catch me"  Transfers Overall transfer level: Needs assistance Equipment used: Rolling walker (2 wheeled) Transfers: Sit to/from [facility] Sit to Stand: Mod assist Stand pivot transfers: Mod assist    Balance  Defer to PT evaluation    ADL Overall ADL's : Needs assistance/impaired     Grooming: Minimal assistance;Sitting   Upper Body Bathing: Moderate assistance;Sitting   Lower Body Bathing: Total assistance;+2 for safety/equipment;Sit to/from stand   Upper Body Dressing : Minimal assistance;Sitting   Lower Body Dressing: Total assistance;+2 for safety/equipment;Sit to/from stand   Toilet Transfer: Moderate assistance;Stand-pivot;RW   Toileting-  Clothing Manipulation and Hygiene: Total assistance;Sit to/from stand;+2 for safety/equipment         General ADL Comments: Patient limited by poor pulmonary/cardiovascular support and increased anxiety during session, Patient anxious with therapist assisting with bed mobility and for stand pivot transfer EOB>recliner. Patient would say " you can't do this by yourself" and "make sure you put that belt around me". Patient's daughter present half-way thru eval & treat. Daughter with multiple questions that this therapist deferred to patient's RN. Patient performed stand pivot transfer with mod assist and max verbal cues for safety; patient with an increased posterior lean during transfer. Patient also with complaints of being dizzy and nauseous; RN aware of this. Patient's 02 sats ranged from 89% (addendum, entered wrong number accidentally)-95% while on 2 liters via [place], this therapist encouraged rest breaks and pursed lip breathing prn.      Vision  patient wears glasses all the time. During OT eval, patient with complaints of something in his eye.          Pertinent Vitals/Pain Pain Assessment: 0-10 Pain Score: 10-Worst pain ever Pain Location: "back, shoulders, fingers, elbows"     Hand Dominance Right   Extremity/Trunk Assessment Upper Extremity Assessment Upper Extremity Assessment: Overall WFL for tasks assessed   Lower Extremity Assessment Lower Extremity Assessment: Defer to PT evaluation   Cervical / Trunk Assessment Cervical / Trunk Assessment: Normal   Communication Communication Communication: No difficulties   Cognition Arousal/Alertness: Awake/alert Behavior During Therapy: Anxious Overall Cognitive Status: Within Functional Limits for tasks assessed       Memory: Decreased short-term memory  Home Living Family/patient expects to be discharged to:: Private residence Living Arrangements: Children (daughter and son-in-law) Available  Help at Discharge: Family Type of Home: House Home Access: Stairs to enter Technical brewer of Steps: 3 Entrance Stairs-Rails: Can reach both Home Layout: One level     Bathroom Shower/Tub: Door;Walk-in Psychologist, prison and probation services: Handicapped height Bathroom Accessibility: Yes How Accessible: Accessible via walker Home Equipment: [place] - 2 wheels;Grab bars - toilet;Grab bars - tub/shower          Prior Functioning/Environment Level of Independence: Independent with assistive device(s)             OT Diagnosis: Generalized weakness;Acute pain;Cognitive deficits   OT Problem List: Decreased strength;Decreased activity tolerance;Impaired balance (sitting and/or standing);Decreased coordination;Decreased cognition;Decreased safety awareness;Decreased knowledge of use of DME or AE;Pain;Increased edema   OT Treatment/Interventions: Self-care/ADL training;Therapeutic exercise;Energy conservation;DME and/or AE instruction;Therapeutic activities;Cognitive remediation/compensation;Balance training;Patient/family education    OT Goals(Current goals can be found in the care plan section) Acute Rehab OT Goals Patient Stated Goal: to go home OT Goal Formulation: With patient/family Time For Goal Achievement: 08/05/14 Potential to Achieve Goals: Good ADL Goals Pt Will Perform Grooming: with supervision;standing Pt Will Perform Upper Body Bathing: with supervision;sitting (sitting for energy conservation) Pt Will Perform Lower Body Bathing: with supervision;sit to/from stand;with adaptive equipment Pt Will Transfer to Toilet: with supervision;ambulating Pt Will Perform Toileting - Clothing Manipulation and hygiene: with supervision;sit to/from stand Additional ADL Goal #1: Patient will be educated on energy conservation techniques to increase overall independence with self-care tasks  OT Frequency: Min 2X/week   Barriers to D/C:  (none known at this time)             End of  Session Equipment Utilized During Treatment: Gait belt;Rolling walker;Oxygen Nurse Communication: Mobility status (OT recommendation and patient's increased anxiety)  Activity Tolerance: Patient limited by fatigue (and anxiety) Patient left: with family/visitor present;in chair;with call bell/phone within reach   Time: 1310-1345 OT Time Calculation (min): 35 min Charges:  OT General Charges $OT Visit: 1 Procedure OT Evaluation $Initial OT Evaluation Tier I: 1 Procedure OT Treatments $Self Care/Home Management : 8-22 mins G-Codes:    Lindwood Mogel , MS, OTR/L, CLT 07/22/2014, 2:01 PM

## 2014-07-22 NOTE — Progress Notes (Signed)
Patient ID: [name], male   DOB: 14-Sep-1946, 68 y.o.   MRN: [id number] Family Medicine Teaching Service Daily Progress Note Intern Pager: [phone]  Patient name: [name] Medical record number: [id number] Date of birth: 03/12/1946 Age: 68 y.o. Gender: male  Primary Care Provider: Jene Every, MD Consultants: Cardiology Code Status: Full  Pt Overview and Major Events to Date:  10/11: Admitted for dyspnea.  Assessment and Plan: [name] is a 68 y.o. male presenting with dyspnea . PMH is significant for heart failure with preserved EF, alcohol abuse, DM2, atrial fibrillation, CAD s/p CABG x4 and left CEA   # Dyspnea: Differential includes CHF exacerbation, MI, pneumothorax, PE, COPD, metastasis of breast carcinoma. PE Wells score of 0, EKG unremarkable with initial negative troponin. BNP elevated but patient seems to be consistently elevated (baseline appears to be 4000 per recent cardiology note). No wheezing on exam and per notes, no evidence of metastatic cancer. Patient fluid overloaded from exam with edema and alcohol use probably a significant cause of exacerbation. Patient is s/p last 51m IV and is on 3L O2. Unsure of dry weight but patient is 2lbs less from when he was last seen 6 days ago (and asymptomatic). Weight on admission is 240lbs. Patient states he has not been taking his HF medications consistently.  - Monitor on telemetry  - Cardiology consulted; Appreciate input  -Currently on Lasix 845mIV BID -> equivalent to home dose. May consider increasing but patient diuresing well.  -TED hose and elevation ordered. - daily weights  - strict in/out  - continue oxygen therapy to keep O2 sats >92%, wean to room air as needed during the day  - continue home 2L of oxygen qhs   # Unsteady gait: new onset. Patient does walk with walker at home but able to ambulate fine. No significant strength deficit on exam. Patient has a left foot drop on exam which appears to be  chronic and patient has been walking with this disability prior. No bladder/bowel incontinence.  - PT/OT consult --> may need rehab - consider MRI of thoracic spine if not improving   # HFpEF: Last EF 63% on Lexiscan stress test in August 2015 with normal study. Patient takes Lasix 16051mID and metolazone 5mg44mce weekly on Mondays. Per Cardiology note on 9/1, patient has a dry weight of 270lbs (however patient has not been this weight since 2013) and baseline proBNP of 4000.  - continue home Coreg 3.125mg2m   # Alcohol abuse: heavy drinker. Drinks at least 4-8oz of liquor most days out of the week. Knows he needs to quit drinking. Drinking seems to have worsened since receiving diagnosis of breast cancer.  - CIWA protocol  - EtOH level  - UDS +opiates  - Social work consult   #Arthralgias - Patient has history of gout. Allopurinol contraindicated with diuretics. May be an acute polyarticular gout flare caused by alcohol use.  - ESR, ANA, uric acid, RF ordered -systemic steroids started. -will add colchicine and NSIADs  # Diabetes mellitus, type 2: last A1C on file of 6.7 on 04/2013. On Lantus 8 units qhs  - Lantus 8u QHS  - SSI sensitive  - Hemoglobin A1c pending  # Atrial fibrillation: currently rate controlled. Per cardiology notes, not a candidate for anticoagulation because of alcohol abuse and high fall risk. - continue coreg 3.125mg 25melemetery   #Hypothyroidim - Last TSH checked in 2013 and was 10. -recheck TSH -holding synthroid until TSH.  May need to readjust medications and titrate up slowly.   # Coronary artery disease: history of CABG x4 in 2011, L CEA in 2011  - continue coreg 3.162m BID  - continue aspirin 815mdaily  - continue atorvastatin 2059maily   # Chronic back pain  - continue home oxycodone 67m69mhrs PRN   FEN/GI: SLIV, cardiac/Carb modified diet  Prophylaxis: heparin subq  Disposition: Continue current management as above; pending further  evaluation.  Subjective:  Patient says that he is still short of breath. He even states that when he turns his head he gets dizzy and winded. Patient also having some weakness and joint pain.   Objective: Temp:  [97.2 F (36.2 C)-98.2 F (36.8 C)] 97.8 F (36.6 C) (10/12 0519) Pulse Rate:  [50-86] 80 (10/12 0822) Resp:  [14-26] 18 (10/12 0519) BP: (109-174)/(63-95) 129/72 mmHg (10/12 0519) SpO2:  [94 %-100 %] 97 % (10/12 0519) Weight:  [239 lb 14.4 oz (108.818 kg)-240 lb (108.863 kg)] 239 lb 14.4 oz (108.818 kg) (10/11 1854)  Intake/Output Summary (Last 24 hours) at 07/22/14 0859 Last data filed at 07/22/14 0711  Gross per 24 hour  Intake      0 ml  Output   1000 ml  Net  -1000 ml   Physical Exam: General: NAD, elderly chronically ill appearing male, sitting up in bed HEENT: EOMI, no jaundice, MMM Cardiovascular: Irregular rhythm, regular rate, no murmurs  Respiratory: CTAB, no wheezes or crackles Abdomen: Soft, non-tender, non-distended, +BS Extremities: Edema of lower extremities with left greater than right. Legs are erythematous. No cyanosis or clubbing. No rash.   MSK: Limited ROM in extremities. Mild joint tenderness. Skin: Erythema of bilateral legs, warm and well perfused.  Neuro: Alert, oriented x3, CN grossly intact. Strength 4/5. Sensation grossly intact.  Laboratory:  Recent Labs Lab 07/21/14 1300 07/22/14 0410  WBC 5.4 7.3  HGB 12.8* 11.9*  HCT 38.4* 37.7*  PLT 129* 145*    Recent Labs Lab 07/21/14 1300 07/22/14 0410  NA 139 144  K 3.7 3.7  CL 94* 99  CO2 34* 33*  BUN 74* 64*  CREATININE 1.09 1.00  CALCIUM 9.5 9.3  PROT 7.5  --   BILITOT 0.4  --   ALKPHOS 78  --   ALT 10  --   AST 17  --   GLUCOSE 210* 174*   UDS +Opiates BNP 3542  Imaging/Diagnostic Tests: Dg Chest 2 View (if Patient Has Fever And/or Copd) 07/21/2014  IMPRESSION: Mild cardiac enlargement.  No edema or consolidation.     Dg Lumbar Spine Complete 07/21/2014   IMPRESSION: Extensive spondylosis and osteoarthritic change. 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 Slight spondylolisthesis at L4-5 is felt to be due to underlying spondylosis. No acute fracture. Mild scoliosis.     Ct Head Wo Contrast 07/21/2014  IMPRESSION: 1. No acute intracranial abnormality. 2. Moderate generalized atrophy, progressive since 2011. Stable mild chronic microvascular ischemic changes of the white matter.     JazmKatheren Shams 07/22/2014, 8:54 AM PGY-1, [facility] pager: [phone] pages welcome

## 2014-07-22 NOTE — Consult Note (Addendum)
Advanced Heart Failure Team Consult Note  Reason for Consul: HF   HPI:    Mr. Currier is a 68 yo male with PMX s/f CAD (s/p CABG x 4 in 2001, normal Myoview 2010), chronic diastolic CHF, chronic a-fib (not AC due to ETOH abuse), DM, HTN, HL, carotid artery disease, COPD, hypothyroidism, EtOH abuse, chronic venous insufficiency, CKD, and morbid obesity. He has not been on anticoagulants due to ETOH abuse and fall risk.   Admitted to [facility] 7/5 through 04/27/13 with massive volume overload. Sluggish diuresis with intermittent lasix, and he was transitioned to Lasix drip and dopamine drip at 3 mcg/kg/hr. Diuresis was sluggish until diamox added with marked improvement. Later switched to torsemide 80 mg in am and 40 mg in pm. Diuresed over 40 pounds. Discharge weight was 230 pounds.   He was admitted in 7/15 with acute on chronic diastolic CHF and responded poorly to IV Lasix. He required hemodialysis transiently but was able to stop it. He was sent home on Lasix 160 mg bid.   Seen in HF clinic last week. Very stable. Weight at home 239-240 pounds. Says he began to develop dyspnea on Friday night. Felt achy all over. Denies edema, orthopnea or PND. + chills/nausea. No fevers or diarrhea. Drank 1/2 pint of liquor on Thursday. Has pain in R elbow and both shoulders as well as knee. Can't lift his arms.   In ER. WBC 7.3. Cr 1.0. proBNP down from baseline 4440->3542. Weight 239 (down from baseline). CXR clear.  ECHO 09/2011: LVEF 45-50%, mod biatral enlargement, moderate RV dilatation, moderate TR, PASP 66 mmHg.  ECHO 08/2012 EF 55-60% moderate bilateral RA/LA enlargement  ECHO 7/15 EF 50-55%, moderately dilated RV.  05/2014- Pre-operative Lexiscan Cardiolite that showed EF 63%, no ischemia or infarction.    Review of Systems: [y] = yes, [ ]  = no   General: Weight gain [ ] ; Weight loss [ ] ; Anorexia Blue.Reese ]; Fatigue Blue.Reese ]; Fever [ ] ; Chills [ y]; Weakness Blue.Reese ]  Cardiac: Chest pain/pressure [ ] ; Resting SOB [  ]; Exertional SOB [ ] ; Orthopnea [ ] ; Pedal Edema [ ] ; Palpitations [ ] ; Syncope [ ] ; Presyncope [ ] ; Paroxysmal nocturnal dyspnea[ ]   Pulmonary: Cough [ ] ; Wheezing[ ] ; Hemoptysis[ ] ; Sputum [ ] ; Snoring [ ]   GI: Vomiting[ ] ; Dysphagia[ ] ; Melena[ ] ; Hematochezia [ ] ; Heartburn[ ] ; Abdominal pain [ ] ; Constipation [ ] ; Diarrhea [ ] ; BRBPR [ ]   GU: Hematuria[ ] ; Dysuria [ ] ; Nocturia[ ]   Vascular: Pain in legs with walking [ ] ; Pain in feet with lying flat [ ] ; Non-healing sores [ ] ; Stroke [ ] ; TIA [ ] ; Slurred speech [ ] ;  Neuro: Headaches[ ] ; Vertigo[ ] ; Seizures[ ] ; Paresthesias[ ] ;Blurred vision [ ] ; Diplopia [ ] ; Vision changes [ ]   Ortho/Skin: Arthritis Blue.Reese ]; Joint pain Blue.Reese ]; Muscle pain Blue.Reese ]; Joint swelling [ ] ; Back Pain [ ] ; Rash [ ]   Psych: Depression[y ]; Anxiety[ ]   Heme: Bleeding problems [ ] ; Clotting disorders [ ] ; Anemia [ ]   Endocrine: Diabetes [ ] ; Thyroid dysfunction[ ]   Home Medications Prior to Admission medications   Medication Sig Start Date End Date Taking? Authorizing Provider  aspirin 81 MG chewable tablet Chew 1 tablet (81 mg total) by mouth daily. 02/05/14  Yes Kelvin Cellar, MD  atorvastatin (LIPITOR) 20 MG tablet Take 20 mg by mouth daily.   Yes Historical Provider, MD  carvedilol (COREG) 3.125 MG tablet Take 1 tablet (3.125 mg total)  by mouth 2 (two) times daily with a meal. 04/29/14  Yes Brittainy Erie Noe, PA-C  colchicine 0.6 MG tablet Take 0.6 mg by mouth daily as needed (for gout).   Yes Historical Provider, MD  Dimethicone (MOISTURE BARRIER EX) Apply 1 application topically daily as needed (Gout under stomach). Apply after showers   Yes Historical Provider, MD  FLUoxetine (PROZAC) 20 MG capsule Take 20 mg by mouth daily.     Yes Historical Provider, MD  Fluticasone Furoate 200 MCG/ACT AEPB Inhale 1 puff into the lungs 2 (two) times daily.   Yes Historical Provider, MD  furosemide (LASIX) 80 MG tablet Take 2 tablets (160 mg total) by mouth 2 (two) times  daily. 04/29/14  Yes Brittainy Erie Noe, PA-C  gabapentin (NEURONTIN) 300 MG capsule Take 1 capsule (300 mg total) by mouth daily. 09/07/12  Yes Srikar Janna Arch, MD  Insulin Glargine (LANTUS SOLOSTAR) 100 UNIT/ML Solostar Pen Inject 16 Units into the skin at bedtime.    Yes Historical Provider, MD  lactulose (CHRONULAC) 10 GM/15ML solution Take 20 g by mouth 2 (two) times daily.   Yes Historical Provider, MD  levothyroxine (SYNTHROID, LEVOTHROID) 150 MCG tablet Take 150 mcg by mouth at bedtime.    Yes Historical Provider, MD  metolazone (ZAROXOLYN) 5 MG tablet Take one tablet by mouth 30 minutes before AM Lasix on Monday. Take extra potassium tab on metolazone day. 05/31/14  Yes Larey Dresser, MD  Multiple Vitamin (MULTIVITAMIN WITH MINERALS) TABS tablet Take 1 tablet by mouth daily.   Yes Historical Provider, MD  omeprazole (PRILOSEC) 20 MG capsule Take 20 mg by mouth daily.     Yes Historical Provider, MD  Oxycodone HCl 10 MG TABS Take 10 mg by mouth every 8 (eight) hours as needed (for pain).   Yes Historical Provider, MD  Potassium Chloride ER 20 MEQ TBCR Take 20-40 mEq by mouth daily. 1 TABLET DAILY, EXCEPT ON MONDAYS TAKE 2 05/14/14  Yes Scott T Weaver, PA-C  senna (SENOKOT) 8.6 MG TABS tablet Take 1 tablet by mouth 2 (two) times daily as needed for mild constipation.   Yes Historical Provider, MD  glucose blood (BAYER CONTOUR TEST) test strip 1 each by Other route as needed. USE TO TEST BLOOD SUGAR TWICE DAILY 01/23/14   Historical Provider, MD    Past Medical History: Past Medical History  Diagnosis Date  . CHRONIC OBSTRUCTIVE PULMONARY DISEASE 06/20/2009  . OBSTRUCTIVE SLEEP APNEA 06/20/2009  . CAROTID STENOSIS 06/20/2009    A. 08/2001 s/p L CEA;  B.   09/14/11 - Carotid U/S - 40-59% bilateral stenosis, left CEA patch angioplasty is patent  . DM 06/20/2009  . CAD 06/20/2009    A.  08/2000 - s/p CABG x 4 - LIMA-LAD, Left Radial-OM, VG-DIAG, VG-RCA;  B. Neg. MV  2010  . HYPERLIPIDEMIA 06/20/2009   . HYPERTENSION 06/20/2009  . Hypothyroidism   . Low back pain   . Asthma     as child  . Pneumonia   . Atrial fibrillation     Not felt to be coumadin candidate 2/2 ETOH use.  Marland Kitchen ETOH abuse   . History of tobacco abuse     remote - quit 1970  . Bilateral renal cysts   . Marijuana abuse   . Morbidly obese   . CHF (congestive heart failure)   . Falls frequently   . Hx of cardiovascular stress test 05/2014    Lexiscan Myoview (8/15):  No ischemia; EF 63% -  Normal Study  . Breast cancer, right breast     Past Surgical History: Past Surgical History  Procedure Laterality Date  . Carotid endarterectomy  2002    left  . Coronary artery bypass graft      x 4 - 2001  . Umbilical hernia repair N/A 01/28/2014    Procedure: HERNIA REPAIR UMBILICAL ADULT/INC;  Surgeon: Harl Bowie, MD;  Location: [place];  Service: General;  Laterality: N/A;  . Laparotomy N/A 01/28/2014    Procedure: EXPLORATORY LAPAROTOMY;  Surgeon: Harl Bowie, MD;  Location: [place];  Service: General;  Laterality: N/A;  . Bowel resection N/A 01/28/2014    Procedure: SMALL BOWEL RESECTION;  Surgeon: Harl Bowie, MD;  Location: [place];  Service: General;  Laterality: N/A;  . Hernia repair      Family History: Family History  Problem Relation Age of Onset  . Stroke Mother     ?  Marland Kitchen Cancer Mother     unknown type of cancer  . Stroke Father     ?  Marland Kitchen Heart attack Neg Hx   . Cancer Brother 46    stomach cancer    Social History: History   Social History  . Marital Status: Single    Spouse Name: N/A    Number of Children: N/A  . Years of Education: N/A   Occupational History  . retired    Social History Main Topics  . Smoking status: Former Smoker    Quit date: 10/11/1968  . Smokeless tobacco: None  . Alcohol Use: Yes     Comment: wild [place] daily;  also reports regularly drinking a pint of vodka.  . Drug Use: Yes    Special: Marijuana     Comment: last 1 year ago  . Sexual Activity:  None   Other Topics Concern  . None   Social History Narrative   Lives in [place] with dtr, son-in-law    Allergies:  Allergies  Allergen Reactions  . Erythromycin Hives    Objective:    Vital Signs:   Temp:  [97.2 F (36.2 C)-98.2 F (36.8 C)] 97.8 F (36.6 C) (10/12 0519) Pulse Rate:  [50-86] 80 (10/12 0822) Resp:  [14-26] 18 (10/12 0519) BP: (109-174)/(63-95) 129/72 mmHg (10/12 0519) SpO2:  [94 %-100 %] 97 % (10/12 0519) Weight:  [108.818 kg (239 lb 14.4 oz)-108.863 kg (240 lb)] 108.818 kg (239 lb 14.4 oz) (10/11 1854)   Filed Weights   07/21/14 1239 07/21/14 1854  Weight: 108.863 kg (240 lb) 108.818 kg (239 lb 14.4 oz)    Physical Exam: General: Chronically ill appearing. No resp difficulty.  HEENT: normal  Neck: supple. JVP appears 7-8 cm Carotids 2+ bilaterally; no bruits. No lymphadenopathy or thryomegaly appreciated.  Cor: PMI normal. Irregular rate & irregular rhythm. No rubs, gallops. 1/6 SEM RUSB  Lungs: CTA  Abdomen: obese, soft, nontender, mildly distended. No hepatosplenomegaly. No bruits or masses. Good bowel sounds.  Extremities: no cyanosis, clubbing, rash, trace edema (L>R, chronic)  Unable to bend R elbow fully due to pain. Unable to raise arms due to shoulder pain Neuro: alert & orientedx3, cranial nerves grossly intact. Moves all 4 extremities w/o difficulty. Affect pleasant.   Telemetry: AF  Labs: Basic Metabolic Panel:  Recent Labs Lab 07/21/14 1300 07/22/14 0410  NA 139 144  K 3.7 3.7  CL 94* 99  CO2 34* 33*  GLUCOSE 210* 174*  BUN 74* 64*  CREATININE 1.09 1.00  CALCIUM 9.5  9.3    Liver Function Tests:  Recent Labs Lab 07/21/14 1300  AST 17  ALT 10  ALKPHOS 78  BILITOT 0.4  PROT 7.5  ALBUMIN 3.1*   No results found for this basename: LIPASE, AMYLASE,  in the last 168 hours No results found for this basename: AMMONIA,  in the last 168 hours  CBC:  Recent Labs Lab 07/21/14 1300 07/22/14 0410  WBC 5.4 7.3   HGB 12.8* 11.9*  HCT 38.4* 37.7*  MCV 94.1 95.9  PLT 129* 145*    Cardiac Enzymes: No results found for this basename: CKTOTAL, CKMB, CKMBINDEX, TROPONINI,  in the last 168 hours  BNP: BNP (last 3 results)  Recent Labs  04/20/14 1655 06/11/14 1624 07/21/14 1300  PROBNP 17870.0* 4440.0* 3542.0*    CBG:  Recent Labs Lab 07/21/14 2025 07/22/14 0421 07/22/14 0804  GLUCAP 150* 186* 141*    Coagulation Studies: No results found for this basename: LABPROT, INR,  in the last 72 hours  Other results:  Imaging: Dg Chest 2 View (if Patient Has Fever And/or Copd)  07/21/2014   CLINICAL DATA:  Two day history of shortness of breath  EXAM: CHEST  2 VIEW  COMPARISON:  April 20, 2014  FINDINGS: There is no appreciable edema or consolidation. Heart is mildly enlarged with pulmonary vascularity within normal limits. Patient is status post coronary artery bypass grafting. No adenopathy. No bone lesions.  IMPRESSION: Mild cardiac enlargement.  No edema or consolidation.   Electronically Signed   By: Lowella Grip M.D.   On: 07/21/2014 14:10   Dg Lumbar Spine Complete  07/21/2014   CLINICAL DATA:  Pain and generalized weakness  EXAM: LUMBAR SPINE - COMPLETE 4+ VIEW  COMPARISON:  None.  FINDINGS: Frontal, lateral, spot lumbosacral lateral, and bilateral oblique views were obtained. There are 5 non-rib-bearing lumbar type vertebral bodies. There is mild lower thoracic and lumbar levoscoliosis. There is no fracture. There is slight retrolisthesis of L4 on L5, felt to be due to underlying spondylosis. No other spondylolisthesis is present. There is marked disc space narrowing at L4-5 and L5-S1. Vacuum phenomenon is noted at L3-4, L4-5, and L5-S1. There is moderate disc space narrowing at L1-2 and L2-3 is well. There is mild narrowing at L3-4. There are bridging anterior osteophytes consistent with diffuse idiopathic skeletal hyperostosis. There is facet osteoarthritic change at all levels  bilaterally.  IMPRESSION: Extensive spondylosis and osteoarthritic change. 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 Slight spondylolisthesis at L4-5 is felt to be due to underlying spondylosis. No acute fracture. Mild scoliosis.   Electronically Signed   By: Lowella Grip M.D.   On: 07/21/2014 14:13   Ct Head Wo Contrast  07/21/2014   CLINICAL DATA:  Dizziness. Recent frequent falls. Generalized weakness. Bilateral lower extremity edema. Current history of diabetes and coronary artery disease. Prior carotid endarterectomy in 2002.  EXAM: CT HEAD WITHOUT CONTRAST  TECHNIQUE: Contiguous axial images were obtained from the base of the skull through the vertex without intravenous contrast.  COMPARISON:  Multiple prior head CTs dating back to 04/05/2010, most recently 03/05/2014.  FINDINGS: Moderate cortical and deep atrophy, progressive since 2011. Mild changes of small vessel disease of the white matter, unchanged. Physiologic calcifications in the basal ganglia, unchanged. No mass lesion. No midline shift. No acute hemorrhage or hematoma. No extra-axial fluid collections. No evidence of acute infarction.  No skull fracture or other focal osseous abnormality involving the skull. Small mucous retention cyst or polyp in the right  maxillary sinus. Remaining visualized paranasal sinuses, bilateral mastoid air cells and bilateral middle ear cavities well-aerated. Extensive bilateral carotid siphon and left vertebral artery atherosclerosis.  IMPRESSION: 1. No acute intracranial abnormality. 2. Moderate generalized atrophy, progressive since 2011. Stable mild chronic microvascular ischemic changes of the white matter.   Electronically Signed   By: Evangeline Dakin M.D.   On: 07/21/2014 14:23         Assessment:   1. 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 Chronic diastolic HF 3. ETOH abuse 4. CKD, Stage III (baseline ~1.4) 5. Chronic AF 6. Breast CA pending  mastectomy   Plan/Discussion:    I suspect he has acute polyarticular gout versus another rheumatologic process in setting of ongoing ETOH abuse. Less likely metastatic breast CA. HF and volume status looks stable - no evidence of acute decompensation. Will check ESR, uric acid, rheumatoid factor and ANA. Start systemic steroids. Watch volume status with steroids and colchicine.  Length of Stay: 1 Glori Bickers  MD  07/22/2014, 11:01 AM  Advanced Heart Failure Team Pager [phone] (M-F; [place])  Please contact [facility] for night-coverage after hours (4p -7a ) and weekends on [email or web address]

## 2014-07-23 ENCOUNTER — Encounter (HOSPITAL_COMMUNITY): Payer: Self-pay | Admitting: General Practice

## 2014-07-23 DIAGNOSIS — E1159 Type 2 diabetes mellitus with other circulatory complications: Secondary | ICD-10-CM

## 2014-07-23 DIAGNOSIS — I5033 Acute on chronic diastolic (congestive) heart failure: Secondary | ICD-10-CM

## 2014-07-23 DIAGNOSIS — M1000 Idiopathic gout, unspecified site: Secondary | ICD-10-CM

## 2014-07-23 DIAGNOSIS — C50921 Malignant neoplasm of unspecified site of right male breast: Secondary | ICD-10-CM

## 2014-07-23 DIAGNOSIS — R609 Edema, unspecified: Secondary | ICD-10-CM

## 2014-07-23 LAB — GLUCOSE, CAPILLARY
GLUCOSE-CAPILLARY: 159 mg/dL — AB (ref 70–99)
GLUCOSE-CAPILLARY: 167 mg/dL — AB (ref 70–99)
Glucose-Capillary: 179 mg/dL — ABNORMAL HIGH (ref 70–99)
Glucose-Capillary: 210 mg/dL — ABNORMAL HIGH (ref 70–99)

## 2014-07-23 LAB — ANA: Anti Nuclear Antibody(ANA): NEGATIVE

## 2014-07-23 MED ORDER — LIVING BETTER WITH HEART FAILURE BOOK
Freq: Once | Status: AC
  Administered 2014-07-23: 08:00:00

## 2014-07-23 MED ORDER — ALLOPURINOL 300 MG PO TABS
300.0000 mg | ORAL_TABLET | Freq: Every day | ORAL | Status: DC
  Administered 2014-07-23 – 2014-07-24 (×2): 300 mg via ORAL
  Filled 2014-07-23 (×2): qty 1

## 2014-07-23 MED ORDER — INSULIN ASPART 100 UNIT/ML ~~LOC~~ SOLN
3.0000 [IU] | Freq: Three times a day (TID) | SUBCUTANEOUS | Status: DC
  Administered 2014-07-23 – 2014-07-24 (×4): 3 [IU] via SUBCUTANEOUS

## 2014-07-23 MED ORDER — LEVOTHYROXINE SODIUM 50 MCG PO TABS
187.0000 ug | ORAL_TABLET | Freq: Every day | ORAL | Status: DC
  Administered 2014-07-24: 187 ug via ORAL
  Filled 2014-07-23 (×2): qty 1

## 2014-07-23 NOTE — Progress Notes (Signed)
Inpatient Diabetes Program Recommendations  AACE/ADA: New Consensus Statement on Inpatient Glycemic Control (2013)  Target Ranges:  Prepandial:   less than 140 mg/dL      Peak postprandial:   less than 180 mg/dL (1-2 hours)      Critically ill patients:  140 - 180 mg/dL     Results for MOROCCO, GIPE (MRN [id number]) as of 07/23/2014 09:31  Ref. Range 07/22/2014 08:04 07/22/2014 11:44 07/22/2014 17:06 07/22/2014 21:00  Glucose-Capillary Latest Range: 70-99 mg/dL 141 (H) 159 (H) 213 (H) 203 (H)     Admitted with Dyspnea.  History of DM, ETOH abuse, CAD, CABG, CHF.  Home DM Meds:  Lantus 16 units QHS  Current Insulin Orders: Lantus 8 units QHS       Novolog Sensitive SSI    **Patient currently getting Prednisone 40 mg daily.  Having some slight glucose elevations in the afternoon.  **Eating 100% of meals    MD- Please consider adding low dose Novolog Meal Coverage to patient's current insulin regimen- Novolog 3 units tid with meals     Will follow Wyn Quaker RN, MSN, CDE Diabetes Coordinator Inpatient Diabetes Program Team Pager: [phone] (8a-10p)

## 2014-07-23 NOTE — Discharge Summary (Signed)
[facility] Discharge Summary  Patient name: [name] Medical record number: [id number] Date of birth: January 06, 1946 Age: 68 y.o. Gender: male Date of Admission: 07/21/2014  Date of Discharge: 07/24/2014 Admitting Physician: Lind Covert, MD  Primary Care Provider: Jene Every, MD Consultants: Cardiology  Indication for Hospitalization: Dyspnea  Discharge Diagnoses/Problem List:  Patient Active Problem List   Diagnosis Date Noted  . Acute gout 07/22/2014  . CHF exacerbation 07/21/2014  . Pre-operative cardiovascular examination 07/15/2014  . Family history of malignant neoplasm of gastrointestinal tract 06/12/2014  . Male breast cancer 06/12/2014  . Cancer of male breast, right 05/29/2014  . Acute on chronic renal failure 04/29/2014  . 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 Acute on chronic systolic and diastolic heart failure, NYHA class 4 04/20/2014  . Diabetes mellitus 01/28/2014  . Panniculitis 04/14/2013  . Acute on chronic diastolic heart failure 82/42/3536  . Seasonal allergies 12/20/2012  . L1 vertebral fracture 09/07/2012  . Acute renal failure 08/30/2012  . Hypothyroidism 08/30/2012  . ETOH abuse   . Atrial fibrillation   . Chronic diastolic heart failure   . DM (diabetes mellitus) type II uncontrolled, periph vascular disorder 06/20/2009  . Obstructive sleep apnea 06/20/2009  . CAROTID STENOSIS 06/20/2009  . CHRONIC OBSTRUCTIVE PULMONARY DISEASE 06/20/2009  . CAD 06/20/2009  . Hypderlipidemia   . Hypertensive heart disease    Disposition: Discharge to CIR  Discharge Condition: Stable  Discharge Exam:  General: NAD, elderly male, sitting up in bed eating breakfast, cooperative and pleasant Cardiovascular: Irregular rhythm, regular rate, no murmurs  Respiratory: CTAB, no wheezes or crackles  Abdomen: Soft, non-tender, non-distended, +BS  Extremities: Trace edema of lower  extremities with left greater than right. Legs are erythematous. No cyanosis or clubbing. No rash. Warm. Unable to palpate pulses.  Brief Hospital Course:  [name] is a 68 y.o. male who presented with dyspnea . PMH is significant for heart failure with preserved EF, alcohol abuse, DM2, atrial fibrillation, CAD s/p CABG x4 and left CEA.  His hospital course by problem is below:  Dyspnea: Differential included CHF exacerbation, MI, pneumothorax, PE, COPD, metastasis of breast carcinoma. PE Wells score of 0, EKG unremarkable with initial negative troponin. BNP elevated but patient seems to be consistently elevated (baseline appears to be 4000 per recent cardiology note). No wheezing on exam and per notes, no evidence of metastatic cancer. Patient fluid overloaded from exam with edema. CXR showed no edema or consolidation. Patient received 32m IV Lasix and then was switched to his home regimen. Patient is at baseline dry weight and no worsening of edema or dyspnea per HF team. Patient stated he had not been taking his HF medications consistently. His dyspnea improved and he was diuresing well. TED hose and elevation were also ordered and helped improve edema. We continued his home oxygen of 2L qhs.   Unsteady gait: Patient has known chronic left leg drop. Patient does walk with walker at home but able to ambulate fine. No significant strength deficit on exam.  PT/OT recommended CIR. The worsening of gait may be due to a vestibulocochlear abnormalities or cerebellum. Will need further outpatient workup.   HFpEF: Last EF 63% on Lexiscan stress test in August 2015 with normal study. Patient takes Lasix 1687mBID and metolazone 34m29mnce weekly on Mondays. Baseline proBNP of 4000. Continued home Coreg.  Alcohol abuse: patient has history of heavy drinking. Drinks at least [place]  of liquor most days out of the week. Knows he needs to quit drinking. Drinking seems to have worsened since receiving diagnosis  of breast cancer. CIWA protocol. Patient did not have any significant withdrawal symptoms.  Arthralgias - Patient has history of gout. May be an acute polyarticular gout flare caused by alcohol use. Improved with steroids, colchicine, and allopurinol.   Patient had elevated uric acid 11 and ESR 48. ANA and RF negative.  Diabetes mellitus, type 2:  A1C 6.8. Lantus 8 units qhs and sensitive SSI. Atrial fibrillation: Currently rate controlled. Per cardiology notes, not a candidate for anticoagulation because of alcohol abuse and high fall risk. Continued coreg. Hypothyroidim - TSH recheck was 5.74. Increased home synthroid to 187 from 158mg.  Recheck in 6-8wks. Elevated BPs - highest being 174/94.  Consider adding low dose ACE for better BP control and for renal protection.  Coronary artery disease: history of CABG x4 in 2011, L CEA in 2011. Continued his Coreg, ASA, and atorvastatin.  Chronic back pain - continued home oxycodone 157mq8hrs PRN  Issues for Follow Up:  1. Recheck TSH in 6-8wks and adjust dose appropriately. 2. Check ears. Complaining of dizziness and imbalance could be cochlear.  3. May need further rheumatology workup for arthralgias.  4. Monitor sugars given recent prednisone use.   Significant Procedures: None  Significant Labs and Imaging:   Recent Labs Lab 07/21/14 1300 07/22/14 0410  WBC 5.4 7.3  HGB 12.8* 11.9*  HCT 38.4* 37.7*  PLT 129* 145*    Recent Labs Lab 07/21/14 1300 07/22/14 0410  NA 139 144  K 3.7 3.7  CL 94* 99  CO2 34* 33*  GLUCOSE 210* 174*  BUN 74* 64*  CREATININE 1.09 1.00  CALCIUM 9.5 9.3  ALKPHOS 78  --   AST 17  --   ALT 10  --   ALBUMIN 3.1*  --    BNP    Component Value Date/Time   PROBNP 3542.0* 07/21/2014 1300   TSH 5.74 ANA and RF neg ESR 48 Uric acid 11.1  Imaging/Diagnostic Tests:  Dg Chest 2 View (if Patient Has Fever And/or Copd)  07/21/2014 IMPRESSION: Mild cardiac enlargement. No edema or consolidation.    Dg Lumbar Spine Complete  07/21/2014 IMPRESSION: Extensive spondylosis and osteoarthritic change. 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 Slight spondylolisthesis at L4-5 is felt to be due to underlying spondylosis. No acute fracture. Mild scoliosis.   Ct Head Wo Contrast  07/21/2014 IMPRESSION: 1. No acute intracranial abnormality. 2. Moderate generalized atrophy, progressive since 2011. Stable mild chronic microvascular ischemic changes of the white matter.   Results/Tests Pending at Time of Discharge: None  Discharge Medications:    Medication List         allopurinol 300 MG tablet  Commonly known as:  ZYLOPRIM  Take 1 tablet (300 mg total) by mouth daily.     aspirin 81 MG chewable tablet  Chew 1 tablet (81 mg total) by mouth daily.     atorvastatin 20 MG tablet  Commonly known as:  LIPITOR  Take 20 mg by mouth daily.     BAYER CONTOUR TEST test strip  Generic drug:  glucose blood  1 each by Other route as needed. USE TO TEST BLOOD SUGAR TWICE DAILY     carvedilol 3.125 MG tablet  Commonly known as:  COREG  Take 1 tablet (3.125 mg total) by mouth 2 (two) times daily with a meal.     colchicine 0.6 MG tablet  Take 1 tablet (0.6 mg total) by mouth daily.     FLUoxetine 20 MG capsule  Commonly known as:  PROZAC  Take 20 mg by mouth daily.     Fluticasone Furoate 200 MCG/ACT Aepb  Inhale 1 puff into the lungs 2 (two) times daily.     furosemide 80 MG tablet  Commonly known as:  LASIX  Take 2 tablets (160 mg total) by mouth 2 (two) times daily.     gabapentin 300 MG capsule  Commonly known as:  NEURONTIN  Take 1 capsule (300 mg total) by mouth daily.     lactulose 10 GM/15ML solution  Commonly known as:  CHRONULAC  Take 20 g by mouth 2 (two) times daily.     LANTUS SOLOSTAR 100 UNIT/ML Solostar Pen  Generic drug:  Insulin Glargine  Inject 16 Units into the skin at bedtime.     levothyroxine 125 MCG tablet  Commonly known as:  SYNTHROID,  LEVOTHROID  Take 1.5 tablets (187 mcg total) by mouth daily before breakfast.     metolazone 5 MG tablet  Commonly known as:  ZAROXOLYN  Take one tablet by mouth 30 minutes before AM Lasix on Monday. Take extra potassium tab on metolazone day.     MOISTURE BARRIER EX  Apply 1 application topically daily as needed (Gout under stomach). Apply after showers     multivitamin with minerals Tabs tablet  Take 1 tablet by mouth daily.     omeprazole 20 MG capsule  Commonly known as:  PRILOSEC  Take 20 mg by mouth daily.     Oxycodone HCl 10 MG Tabs  Take 10 mg by mouth every 8 (eight) hours as needed (for pain).     Potassium Chloride ER 20 MEQ Tbcr  Take 20-40 mEq by mouth daily. 1 TABLET DAILY, EXCEPT ON MONDAYS TAKE 2     senna 8.6 MG Tabs tablet  Commonly known as:  SENOKOT  Take 1 tablet by mouth 2 (two) times daily as needed for mild constipation.        Discharge Instructions: Please refer to Patient Instructions section of EMR for full details.  Patient was counseled important signs and symptoms that should prompt return to medical care, changes in medications, dietary instructions, activity restrictions, and follow up appointments.   Follow-Up Appointments: -Will need PCP follow-up  Katheren Shams, DO 07/24/2014, 11:37 AM PGY-1, [facility]

## 2014-07-23 NOTE — Progress Notes (Addendum)
FMTS Attending Daily Note:  Jeff Walden MD  [phone] pager  Family Practice pager:  [phone] I have seen and examined this patient and have reviewed their chart. I have discussed this patient with the resident. I agree with the resident's findings, assessment and care plan.  Additionally:  - Main issue on admit seemed to be dyspnea -- however at dry weight on home dose diuretics.  No worsening edema.  Some subjective SOB. - Seen by HF Team.  Dyspnea and heart failure at baseline.   - Main issue actually athralgias throughout with some upper extremity weakness, secondary to pain.  Started on colchicine/prednisone for presumed gout. - Much better today from pain standpoint.  Uric acid elevated, ESR not elevated for his age.  Likely rheumatologic issue but doesn't look like clear cut gout without hot, swollen joints and symmetric pain.   - Also with Left leg drop -- he states this is present for at least 6 years s/p fall at that time.  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   - Placement now the main issue.   Jeffrey H Walden, MD 07/23/2014 4:48 PM    

## 2014-07-23 NOTE — Progress Notes (Signed)
Physical Therapy Treatment Patient Details Name: [name] MRN: [id number] DOB: 06/21/1946 Today's Date: 07/23/2014    History of Present Illness Pt is a 68 y/o male admitted s/p acute on chronic systolic and diastolic heart failure, NYHA class 4.     PT Comments    Standing balance and gait significantly limited by scewed sense of midline ( easily off by 20*)  Emphasized pre-gait to work on midline orientation.  Follow Up Recommendations  CIR     Equipment Recommendations  None recommended by PT;Other (comment)    Recommendations for Other Services Rehab consult     Precautions / Restrictions Precautions Precautions: None Restrictions Weight Bearing Restrictions: No    Mobility  Bed Mobility                  Transfers Overall transfer level: Needs assistance Equipment used: Rolling walker (2 wheeled) Transfers: Sit to/from Stand Sit to Stand: Mod assist;+2 safety/equipment         General transfer comment: cues for hand placement and to get pt positioned for symmetrical stand.  Ambulation/Gait Ambulation/Gait assistance: Max assist Ambulation Distance (Feet): 7 Feet (then additional 7 feet with RW and extra assist) Assistive device: Rolling walker (2 wheeled) Gait Pattern/deviations: Step-through pattern;Ataxic;Narrow base of support (with heavy lean L)     General Gait Details: With significant w/shift assist, pt able to amb about 7 feet before getting unsafe for pt and therapist.  pt needs supporting in order to advance L foot then has trouble placing appropriately   Stairs            Wheelchair Mobility    Modified Rankin (Stroke Patients Only)       Balance Overall balance assessment: Needs assistance Sitting-balance support: No upper extremity supported Sitting balance-Leahy Scale: Fair Sitting balance - Comments: effortful scooting, but sits without need for UE assist Postural control: Left lateral lean Standing balance  support: Bilateral upper extremity supported Standing balance-Leahy Scale: Poor Standing balance comment: Stood before each gait trial and worked on balance and midline orientation.  Even with full truncal stability, pt having trouble with ataxia and placing L LE appropriately.                    Cognition Arousal/Alertness: Awake/alert Behavior During Therapy: Anxious Overall Cognitive Status: Within Functional Limits for tasks assessed                      Exercises General Exercises - Lower Extremity Long Arc Quad: AROM;Strengthening;Both;10 reps;Supine Hip Flexion/Marching: AROM;Strengthening;Both;10 reps;Supine    General Comments        Pertinent Vitals/Pain Pain Assessment: 0-10 Pain Score:  (not rated) Pain Location: back Pain Intervention(s): Patient requesting pain meds-RN notified    Home Living                      Prior Function            PT Goals (current goals can now be found in the care plan section) Acute Rehab PT Goals Patient Stated Goal: to go home PT Goal Formulation: With patient Time For Goal Achievement: 08/05/14 Potential to Achieve Goals: Good Progress towards PT goals: Progressing toward goals    Frequency  Min 4X/week    PT Plan Current plan remains appropriate    Co-evaluation             End of Session Equipment Utilized During Treatment: Gait belt;Oxygen Activity Tolerance:  Patient limited by fatigue;Patient tolerated treatment well Patient left: in chair;with call bell/phone within reach     Time: 3007-6226 PT Time Calculation (min): 31 min  Charges:  $Gait Training: 8-22 mins $Therapeutic Activity: 8-22 mins                    G Codes:      Belal Scallon, Tessie Fass 07/23/2014, 11:33 AM 07/23/2014  Donnella Sham, PT [phone]  (pager)

## 2014-07-23 NOTE — Progress Notes (Signed)
Pt A/O x4 and vital signs stable, pt complained of new onset of double vision that went away. Neuro exam was normal. CIWA was zero. Notified MD and told to page again if double vision occurs again. MD will follow up otherwise in AM.

## 2014-07-23 NOTE — Progress Notes (Addendum)
Advanced Heart Failure Rounding Note   Subjective:     Started on prednisone for presumed polyarticular gout yesterday. Feels much better. Able to lift arms over head. Denies dyspnea. Uric acid 11.1 ESR 48. RF < 10. Weight below baseline.    Objective:   Weight Range:  Vital Signs:   Temp:  [97.8 F (36.6 C)-98.4 F (36.9 C)] 97.8 F (36.6 C) (10/13 0508) Pulse Rate:  [70-78] 70 (10/13 0508) Resp:  [18-19] 18 (10/13 0508) BP: (147-164)/(86-91) 147/88 mmHg (10/13 0508) SpO2:  [96 %-98 %] 98 % (10/13 0508) Weight:  [105.87 kg (233 lb 6.4 oz)] 105.87 kg (233 lb 6.4 oz) (10/13 0508)    Weight change: Filed Weights   07/21/14 1239 07/21/14 1854 07/23/14 0508  Weight: 108.863 kg (240 lb) 108.818 kg (239 lb 14.4 oz) 105.87 kg (233 lb 6.4 oz)    Intake/Output:   Intake/Output Summary (Last 24 hours) at 07/23/14 0956 Last data filed at 07/23/14 0730  Gross per 24 hour  Intake    520 ml  Output   1850 ml  Net  -1330 ml     Physical Exam:  General: Chronically ill appearing. No resp difficulty.  HEENT: normal  Neck: supple. JVP appears 7-8 cm Carotids 2+ bilaterally; no bruits. No lymphadenopathy or thryomegaly appreciated.  Cor: PMI normal. Irregular rate & irregular rhythm. No rubs, gallops. 1/6 SEM RUSB  Lungs: CTA  Abdomen: obese, soft, nontender, mildly distended. No hepatosplenomegaly. No bruits or masses. Good bowel sounds.  Extremities: no cyanosis, clubbing, rash, trace edema (L>R, chronic) Shoulder and elbow ROM much improved. Neuro: alert & orientedx3, cranial nerves grossly intact. Moves all 4 extremities w/o difficulty. Affect pleasant.   Telemetry: AF   Labs: Basic Metabolic Panel:  Recent Labs Lab 07/21/14 1300 07/22/14 0410  NA 139 144  K 3.7 3.7  CL 94* 99  CO2 34* 33*  GLUCOSE 210* 174*  BUN 74* 64*  CREATININE 1.09 1.00  CALCIUM 9.5 9.3    Liver Function Tests:  Recent Labs Lab 07/21/14 1300  AST 17  ALT 10  ALKPHOS 78  BILITOT 0.4   PROT 7.5  ALBUMIN 3.1*   No results found for this basename: LIPASE, AMYLASE,  in the last 168 hours No results found for this basename: AMMONIA,  in the last 168 hours  CBC:  Recent Labs Lab 07/21/14 1300 07/22/14 0410  WBC 5.4 7.3  HGB 12.8* 11.9*  HCT 38.4* 37.7*  MCV 94.1 95.9  PLT 129* 145*    Cardiac Enzymes: No results found for this basename: CKTOTAL, CKMB, CKMBINDEX, TROPONINI,  in the last 168 hours  BNP: BNP (last 3 results)  Recent Labs  04/20/14 1655 06/11/14 1624 07/21/14 1300  PROBNP 17870.0* 4440.0* 3542.0*     Other results:  EKG:   Imaging: Dg Chest 2 View (if Patient Has Fever And/or Copd)  07/21/2014   CLINICAL DATA:  Two day history of shortness of breath  EXAM: CHEST  2 VIEW  COMPARISON:  April 20, 2014  FINDINGS: There is no appreciable edema or consolidation. Heart is mildly enlarged with pulmonary vascularity within normal limits. Patient is status post coronary artery bypass grafting. No adenopathy. No bone lesions.  IMPRESSION: Mild cardiac enlargement.  No edema or consolidation.   Electronically Signed   By: Lowella Grip M.D.   On: 07/21/2014 14:10   Dg Lumbar Spine Complete  07/21/2014   CLINICAL DATA:  Pain and generalized weakness  EXAM: LUMBAR SPINE - COMPLETE  4+ VIEW  COMPARISON:  None.  FINDINGS: Frontal, lateral, spot lumbosacral lateral, and bilateral oblique views were obtained. There are 5 non-rib-bearing lumbar type vertebral bodies. There is mild lower thoracic and lumbar levoscoliosis. There is no fracture. There is slight retrolisthesis of L4 on L5, felt to be due to underlying spondylosis. No other spondylolisthesis is present. There is marked disc space narrowing at L4-5 and L5-S1. Vacuum phenomenon is noted at L3-4, L4-5, and L5-S1. There is moderate disc space narrowing at L1-2 and L2-3 is well. There is mild narrowing at L3-4. There are bridging anterior osteophytes consistent with diffuse idiopathic skeletal  hyperostosis. There is facet osteoarthritic change at all levels bilaterally.  IMPRESSION: Extensive spondylosis and osteoarthritic change. 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 Slight spondylolisthesis at L4-5 is felt to be due to underlying spondylosis. No acute fracture. Mild scoliosis.   Electronically Signed   By: Lowella Grip M.D.   On: 07/21/2014 14:13   Ct Head Wo Contrast  07/21/2014   CLINICAL DATA:  Dizziness. Recent frequent falls. Generalized weakness. Bilateral lower extremity edema. Current history of diabetes and coronary artery disease. Prior carotid endarterectomy in 2002.  EXAM: CT HEAD WITHOUT CONTRAST  TECHNIQUE: Contiguous axial images were obtained from the base of the skull through the vertex without intravenous contrast.  COMPARISON:  Multiple prior head CTs dating back to 04/05/2010, most recently 03/05/2014.  FINDINGS: Moderate cortical and deep atrophy, progressive since 2011. Mild changes of small vessel disease of the white matter, unchanged. Physiologic calcifications in the basal ganglia, unchanged. No mass lesion. No midline shift. No acute hemorrhage or hematoma. No extra-axial fluid collections. No evidence of acute infarction.  No skull fracture or other focal osseous abnormality involving the skull. Small mucous retention cyst or polyp in the right maxillary sinus. Remaining visualized paranasal sinuses, bilateral mastoid air cells and bilateral middle ear cavities well-aerated. Extensive bilateral carotid siphon and left vertebral artery atherosclerosis.  IMPRESSION: 1. No acute intracranial abnormality. 2. Moderate generalized atrophy, progressive since 2011. Stable mild chronic microvascular ischemic changes of the white matter.   Electronically Signed   By: Evangeline Dakin M.D.   On: 07/21/2014 14:23      Medications:     Scheduled Medications: . aspirin  81 mg Oral Daily  . atorvastatin  20 mg Oral Daily  . carvedilol  3.125 mg Oral BID WC   . colchicine  0.6 mg Oral BID  . FLUoxetine  20 mg Oral Daily  . fluticasone  1 puff Inhalation BID  . folic acid  1 mg Oral Daily  . furosemide  160 mg Oral BID  . gabapentin  300 mg Oral Daily  . heparin  5,000 Units Subcutaneous 3 times per day  . insulin aspart  0-9 Units Subcutaneous TID WC  . insulin glargine  8 Units Subcutaneous QHS  . [START ON 07/24/2014] levothyroxine  187 mcg Oral QAC breakfast  . Living Better with Heart Failure Book   Does not apply Once  . LORazepam  0-4 mg Oral Q6H   Followed by  . LORazepam  0-4 mg Oral Q12H  . multivitamin with minerals  1 tablet Oral Daily  . pantoprazole  40 mg Oral Daily  . potassium chloride  20 mEq Oral Daily  . predniSONE  40 mg Oral Q breakfast  . sodium chloride  3 mL Intravenous Q12H  . sodium chloride  3 mL Intravenous Q12H  . thiamine  100 mg Oral Daily   Or  .  thiamine  100 mg Intravenous Daily     Infusions:     PRN Medications:  sodium chloride, acetaminophen, acetaminophen, LORazepam, LORazepam, oxyCODONE, senna, sodium chloride   Assessment:   1. Acute polyarticular gout  2. Chronic diastolic HF  3. ETOH abuse  4. CKD, Stage III (baseline ~1.4)  5. Chronic AF  6. Breast CA pending mastectomy 7. Debilitation   Plan/Discussion:     He has responded very well to prednisone and colchicine. Looks much better. Volume status looks ok. Continue po diuretics. Will start allopurinol.   Agree with CIR. Stable from our perspective for transfer there. No evidence DTs.   Not coumadin candidate.    Length of Stay: 2   Glori Bickers MD  07/23/2014, 9:56 AM  Advanced Heart Failure Team Pager [phone] (M-F; [place])  Please contact [facility] for night-coverage after hours (4p -7a ) and weekends on [email or web address]

## 2014-07-23 NOTE — Consult Note (Signed)
Physical Medicine and Rehabilitation Consult  Reason for Consult: Gout flare with weakness and pain Referring Physician: Dr. Erin Hearing   HPI: [name] is a 68 y.o. male with history of OSA, HTN, CAF, ongoing alcohol abuse, chronic pain; who was admitted on 07/21/14 with fatigue, increased weakness and BLE edema. He was treated with IV lasix due to complaints of SOB and Dr. Haroldine Laws consulted for input.  He felt that volume status was stable but patient with polyarticular gout flare in setting of ETOH abuse and added steroids for treatment. Uric acid 11.1 with TSH 5.74. X rays lumbar spine with extensive spondylosis and osteoarthritic change with diffuse idiopathic skeletal hyperostosis. PT/OT evaluations done yesterday and CIR recommended by rehab team and MD.   Review of Systems  HENT: Negative for tinnitus.   Eyes: Negative for blurred vision and double vision.  Respiratory: Negative for cough and shortness of breath.        Breathing "not right yet"  Cardiovascular: Positive for leg swelling. Negative for chest pain and palpitations.  Musculoskeletal: Positive for back pain, joint pain, myalgias and neck pain.  Neurological: Positive for dizziness and weakness. Negative for headaches.    Past Medical History  Diagnosis Date  . CHRONIC OBSTRUCTIVE PULMONARY DISEASE 06/20/2009  . OBSTRUCTIVE SLEEP APNEA 06/20/2009  . CAROTID STENOSIS 06/20/2009    A. 08/2001 s/p L CEA;  B.   09/14/11 - Carotid U/S - 40-59% bilateral stenosis, left CEA patch angioplasty is patent  . DM 06/20/2009  . CAD 06/20/2009    A.  08/2000 - s/p CABG x 4 - LIMA-LAD, Left Radial-OM, VG-DIAG, VG-RCA;  B. Neg. MV  2010  . HYPERLIPIDEMIA 06/20/2009  . HYPERTENSION 06/20/2009  . Hypothyroidism   . Low back pain   . Asthma     as child  . Pneumonia   . Atrial fibrillation     Not felt to be coumadin candidate 2/2 ETOH use.  Marland Kitchen ETOH abuse   . History of tobacco abuse     remote - quit 1970  . Bilateral  renal cysts   . Marijuana abuse   . Morbidly obese   . CHF (congestive heart failure)   . Falls frequently   . Hx of cardiovascular stress test 05/2014    Lexiscan Myoview (8/15):  No ischemia; EF 63% - Normal Study  . Breast cancer, right breast     Past Surgical History  Procedure Laterality Date  . Carotid endarterectomy  2002    left  . Coronary artery bypass graft      x 4 - 2001  . Umbilical hernia repair N/A 01/28/2014    Procedure: HERNIA REPAIR UMBILICAL ADULT/INC;  Surgeon: Harl Bowie, MD;  Location: [facility];  Service: General;  Laterality: N/A;  . Laparotomy N/A 01/28/2014    Procedure: EXPLORATORY LAPAROTOMY;  Surgeon: Harl Bowie, MD;  Location: [facility];  Service: General;  Laterality: N/A;  . Bowel resection N/A 01/28/2014    Procedure: SMALL BOWEL RESECTION;  Surgeon: Harl Bowie, MD;  Location: [facility];  Service: General;  Laterality: N/A;  . Hernia repair      Family History  Problem Relation Age of Onset  . Stroke Mother     ?  Marland Kitchen Cancer Mother     unknown type of cancer  . Stroke Father     ?  Marland Kitchen Heart attack Neg Hx   . Cancer Brother 68    stomach cancer  Social History:  Lives with family--daughter at home.  Independent with walker PTA. He reports that he quit smoking about 45 years ago. He does not have any smokeless tobacco history on file. He reports that he drinks a 1/5 of liquor daily. He reports that he uses illicit drugs (Marijuana).   Allergies  Allergen Reactions  . Erythromycin Hives    Medications Prior to Admission  Medication Sig Dispense Refill  . aspirin 81 MG chewable tablet Chew 1 tablet (81 mg total) by mouth daily.  30 tablet  1  . atorvastatin (LIPITOR) 20 MG tablet Take 20 mg by mouth daily.      . carvedilol (COREG) 3.125 MG tablet Take 1 tablet (3.125 mg total) by mouth 2 (two) times daily with a meal.  60 tablet  5  . colchicine 0.6 MG tablet Take 0.6 mg by mouth daily as needed (for gout).      . Dimethicone  (MOISTURE BARRIER EX) Apply 1 application topically daily as needed (Gout under stomach). Apply after showers      . FLUoxetine (PROZAC) 20 MG capsule Take 20 mg by mouth daily.        . Fluticasone Furoate 200 MCG/ACT AEPB Inhale 1 puff into the lungs 2 (two) times daily.      . furosemide (LASIX) 80 MG tablet Take 2 tablets (160 mg total) by mouth 2 (two) times daily.  120 tablet  5  . gabapentin (NEURONTIN) 300 MG capsule Take 1 capsule (300 mg total) by mouth daily.      . Insulin Glargine (LANTUS SOLOSTAR) 100 UNIT/ML Solostar Pen Inject 16 Units into the skin at bedtime.       Marland Kitchen lactulose (CHRONULAC) 10 GM/15ML solution Take 20 g by mouth 2 (two) times daily.      Marland Kitchen levothyroxine (SYNTHROID, LEVOTHROID) 150 MCG tablet Take 150 mcg by mouth at bedtime.       . metolazone (ZAROXOLYN) 5 MG tablet Take one tablet by mouth 30 minutes before AM Lasix on Monday. Take extra potassium tab on metolazone day.  15 tablet  3  . Multiple Vitamin (MULTIVITAMIN WITH MINERALS) TABS tablet Take 1 tablet by mouth daily.      Marland Kitchen omeprazole (PRILOSEC) 20 MG capsule Take 20 mg by mouth daily.        . Oxycodone HCl 10 MG TABS Take 10 mg by mouth every 8 (eight) hours as needed (for pain).      . Potassium Chloride ER 20 MEQ TBCR Take 20-40 mEq by mouth daily. 1 TABLET DAILY, EXCEPT ON MONDAYS TAKE 2      . senna (SENOKOT) 8.6 MG TABS tablet Take 1 tablet by mouth 2 (two) times daily as needed for mild constipation.      Marland Kitchen glucose blood (BAYER CONTOUR TEST) test strip 1 each by Other route as needed. USE TO TEST BLOOD SUGAR TWICE DAILY        Home: Home Living Family/patient expects to be discharged to:: Private residence Living Arrangements: Children (daughter and son-in-law) Available Help at Discharge: Family Type of Home: House Home Access: Stairs to enter Technical brewer of Steps: 3 Entrance Stairs-Rails: Can reach both Home Layout: One level [place]: Walker - 2 wheels;Grab bars -  toilet;Grab bars - tub/shower  Functional History: Prior Function Level of Independence: Independent with assistive device(s) Functional Status:  Mobility: Bed Mobility Overal bed mobility: Needs Assistance Bed Mobility: Rolling;Supine to Sit Rolling: Min assist Supine to sit: Min assist General bed  mobility comments: did not do bed mobility, pt stayed in the chair Transfers Overall transfer level: Needs assistance Equipment used: Rolling walker (2 wheeled) Transfers: Sit to/from Stand Sit to Stand: Mod assist Stand pivot transfers: Mod assist General transfer comment: cues for hand placement and to get pt positioned for symmetrical stand. Ambulation/Gait General Gait Details: see pre-gait comments    ADL: ADL Overall ADL's : Needs assistance/impaired Grooming: Minimal assistance;Sitting Upper Body Bathing: Moderate assistance;Sitting Lower Body Bathing: Total assistance;+2 for safety/equipment;Sit to/from stand Upper Body Dressing : Minimal assistance;Sitting Lower Body Dressing: Total assistance;+2 for safety/equipment;Sit to/from stand Toilet Transfer: Moderate assistance;Stand-pivot;RW Toileting- Clothing Manipulation and Hygiene: Total assistance;Sit to/from stand;+2 for safety/equipment General ADL Comments: Patient limited by poor pulmonary/cardiovascular support and increased anxiety during session, Patient anxious with therapist assisting with bed mobility and for stand pivot transfer EOB>recliner. Patient would say " you can't do this by yourself" and "make sure you put that belt around me". Patient's daughter present half-way thru eval & treat. Daughter with multiple questions that this therapist deferred to patient's RN. Patient performed stand pivot transfer with mod assist and max verbal cues for safety; patient with an increased posterior lean during transfer. Patient also with complaints of being dizzy and nauseous; RN aware of this.   Cognition: Cognition Overall  Cognitive Status: Within Functional Limits for tasks assessed Orientation Level: Oriented X4 Cognition Arousal/Alertness: Awake/alert Behavior During Therapy: Anxious Overall Cognitive Status: Within Functional Limits for tasks assessed Memory: Decreased short-term memory  Blood pressure 147/88, pulse 70, temperature 97.8 F (36.6 C), temperature source Oral, resp. rate 18, height 5\' 7"  (1.702 m), weight 105.87 kg (233 lb 6.4 oz), SpO2 98.00%. Physical Exam  Nursing note and vitals reviewed. Constitutional: He is oriented to person, place, and time. He appears well-developed and well-nourished.  Morbidly obese  HENT:  Head: Normocephalic and atraumatic.  Eyes: Conjunctivae are normal. Pupils are equal, round, and reactive to light.  Neck: Normal range of motion. Neck supple.  Cardiovascular: Normal rate.  An irregularly irregular rhythm present.  Respiratory: He has wheezes in the right middle field, the right lower field, the left middle field and the left lower field.  GI: Soft. Bowel sounds are normal. He exhibits no distension.  Musculoskeletal: He exhibits edema and tenderness.  2+ edema RLE>LLE. No pain with ROM bilateral elbows. No edema or erythema bilateral elbows.  Right shoulder with RTC signs and substantial pain in ROM. Grip weak on right due to pain and deformities.    Neurological: He is alert and oriented to person, place, and time.  Skin: Skin is warm and dry.  Psychiatric: He has a normal mood and affect. His behavior is normal. Judgment and thought content normal.    Results for orders placed during the hospital encounter of 07/21/14 (from the past 24 hour(s))  TSH     Status: Abnormal   Collection Time    07/22/14 11:16 AM      Result Value Ref Range   TSH 5.740 (*) 0.350 - 4.500 uIU/mL  SEDIMENTATION RATE     Status: Abnormal   Collection Time    07/22/14 11:30 AM      Result Value Ref Range   Sed Rate 48 (*) 0 - 16 mm/hr  URIC ACID     Status: Abnormal    Collection Time    07/22/14 11:30 AM      Result Value Ref Range   Uric Acid, Serum 11.1 (*) 4.0 - 7.8 mg/dL  RHEUMATOID  FACTOR     Status: None   Collection Time    07/22/14 11:30 AM      Result Value Ref Range   Rheumatoid Factor <10  <=14 IU/mL  GLUCOSE, CAPILLARY     Status: Abnormal   Collection Time    07/22/14 11:44 AM      Result Value Ref Range   Glucose-Capillary 159 (*) 70 - 99 mg/dL   Comment 1 Documented in Chart     Comment 2 Notify RN    GLUCOSE, CAPILLARY     Status: Abnormal   Collection Time    07/22/14  5:06 PM      Result Value Ref Range   Glucose-Capillary 213 (*) 70 - 99 mg/dL   Comment 1 Documented in Chart     Comment 2 Notify RN    GLUCOSE, CAPILLARY     Status: Abnormal   Collection Time    07/22/14  9:00 PM      Result Value Ref Range   Glucose-Capillary 203 (*) 70 - 99 mg/dL   Comment 1 Notify RN    GLUCOSE, CAPILLARY     Status: Abnormal   Collection Time    07/23/14  7:52 AM      Result Value Ref Range   Glucose-Capillary 159 (*) 70 - 99 mg/dL   Comment 1 Documented in Chart     Comment 2 Notify RN     Dg Chest 2 View (if Patient Has Fever And/or Copd)  07/21/2014   CLINICAL DATA:  Two day history of shortness of breath  EXAM: CHEST  2 VIEW  COMPARISON:  April 20, 2014  FINDINGS: There is no appreciable edema or consolidation. Heart is mildly enlarged with pulmonary vascularity within normal limits. Patient is status post coronary artery bypass grafting. No adenopathy. No bone lesions.  IMPRESSION: Mild cardiac enlargement.  No edema or consolidation.   Electronically Signed   By: Lowella Grip M.D.   On: 07/21/2014 14:10   Dg Lumbar Spine Complete  07/21/2014   CLINICAL DATA:  Pain and generalized weakness  EXAM: LUMBAR SPINE - COMPLETE 4+ VIEW  COMPARISON:  None.  FINDINGS: Frontal, lateral, spot lumbosacral lateral, and bilateral oblique views were obtained. There are 5 non-rib-bearing lumbar type vertebral bodies. There is mild lower  thoracic and lumbar levoscoliosis. There is no fracture. There is slight retrolisthesis of L4 on L5, felt to be due to underlying spondylosis. No other spondylolisthesis is present. There is marked disc space narrowing at L4-5 and L5-S1. Vacuum phenomenon is noted at L3-4, L4-5, and L5-S1. There is moderate disc space narrowing at L1-2 and L2-3 is well. There is mild narrowing at L3-4. There are bridging anterior osteophytes consistent with diffuse idiopathic skeletal hyperostosis. There is facet osteoarthritic change at all levels bilaterally.  IMPRESSION: Extensive spondylosis and osteoarthritic change. 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 Slight spondylolisthesis at L4-5 is felt to be due to underlying spondylosis. No acute fracture. Mild scoliosis.   Electronically Signed   By: Lowella Grip M.D.   On: 07/21/2014 14:13   Ct Head Wo Contrast  07/21/2014   CLINICAL DATA:  Dizziness. Recent frequent falls. Generalized weakness. Bilateral lower extremity edema. Current history of diabetes and coronary artery disease. Prior carotid endarterectomy in 2002.  EXAM: CT HEAD WITHOUT CONTRAST  TECHNIQUE: Contiguous axial images were obtained from the base of the skull through the vertex without intravenous contrast.  COMPARISON:  Multiple prior head CTs dating back to 04/05/2010, most recently  03/05/2014.  FINDINGS: Moderate cortical and deep atrophy, progressive since 2011. Mild changes of small vessel disease of the white matter, unchanged. Physiologic calcifications in the basal ganglia, unchanged. No mass lesion. No midline shift. No acute hemorrhage or hematoma. No extra-axial fluid collections. No evidence of acute infarction.  No skull fracture or other focal osseous abnormality involving the skull. Small mucous retention cyst or polyp in the right maxillary sinus. Remaining visualized paranasal sinuses, bilateral mastoid air cells and bilateral middle ear cavities well-aerated. Extensive  bilateral carotid siphon and left vertebral artery atherosclerosis.  IMPRESSION: 1. No acute intracranial abnormality. 2. Moderate generalized atrophy, progressive since 2011. Stable mild chronic microvascular ischemic changes of the white matter.   Electronically Signed   By: Evangeline Dakin M.D.   On: 07/21/2014 14:23    Assessment/Plan: Diagnosis: functional deficits due to polyarticular gout flare, in the setting of etoh abuse, poor health hygiene 1. Does the need for close, 24 hr/day medical supervision in concert with the patient's rehab needs make it unreasonable for this patient to be served in a less intensive setting? Yes 2. Co-Morbidities requiring supervision/potential complications: dm, chf, cad, morbid obesity 3. Due to bladder management, bowel management, safety, skin/wound care, disease management, medication administration, pain management and patient education, does the patient require 24 hr/day rehab nursing? Yes 4. Does the patient require coordinated care of a physician, rehab nurse, PT (1-2 hrs/day, 5 days/week) and OT (1-2 hrs/day, 5 days/week) to address physical and functional deficits in the context of the above medical diagnosis(es)? Yes Addressing deficits in the following areas: balance, endurance, locomotion, strength, transferring, bowel/bladder control, bathing, dressing, feeding, grooming and toileting 5. Can the patient actively participate in an intensive therapy program of at least 3 hrs of therapy per day at least 5 days per week? Potentially 6. The potential for patient to make measurable gains while on inpatient rehab is good and fair 7. Anticipated functional outcomes upon discharge from inpatient rehab are supervision  with PT, supervision with OT, n/a with SLP. 8. Estimated rehab length of stay to reach the above functional goals is: 8-10 days 9. Does the patient have adequate social supports to accommodate these discharge functional goals?  Potentially 10. Anticipated D/C setting: Home 11. Anticipated post D/C treatments: [place] therapy 12. Overall Rehab/Functional Prognosis: good and fair  RECOMMENDATIONS: This patient's condition is appropriate for continued rehabilitative care in the following setting: ?CIR Patient has agreed to participate in recommended program. Yes Note that insurance prior authorization may be required for reimbursement for recommended care.  Comment: Pt is unsure if he can participate in the intensity of our program. He was very sedentary PTA. I am open to bringing him to inpatient rehab and encouraged him that I thought he could participate in our program. He will speak with family. Rehab Admissions Coordinator to follow up.  Thanks,  [name], MD, [facility]     07/23/2014

## 2014-07-23 NOTE — Progress Notes (Signed)
Patient ID: [name], male   DOB: [birth year], 68 y.o.   MRN: [id number] Family Medicine Teaching Service Daily Progress Note Intern Pager: [phone]  Patient name: [name] Medical record number: [id number] Date of birth: [birth year] Age: 68 y.o. Gender: male  Primary Care Provider: Jene Every, MD Consultants: Cardiology Code Status: Full  Pt Overview and Major Events to Date:  10/11: Admitted for dyspnea.  Assessment and Plan: [name] is a 68 y.o. male presenting with dyspnea . PMH is significant for heart failure with preserved EF, alcohol abuse, DM2, atrial fibrillation, CAD s/p CABG x4 and left CEA   # Dyspnea: Differential includes CHF exacerbation, MI, pneumothorax, PE, COPD, metastasis of breast carcinoma. PE Wells score of 0, EKG unremarkable with initial negative troponin. BNP elevated but patient seems to be consistently elevated (baseline appears to be 4000 per recent cardiology note). No wheezing on exam and per notes, no evidence of metastatic cancer. Patient fluid overloaded from exam with edema and alcohol use probably a significant cause of exacerbation. Patient is s/p last 43m IV. Patient is at baseline weight. Patient states he has not been taking his HF medications consistently. Improved. - Monitor on telemetry  - Cardiology consulted; Appreciate input  -Currently on Lasix 1645mBID(home dose). Diuresing well.  -TED hose and elevation ordered. - daily weights  - strict in/out  - continue oxygen therapy to keep O2 sats >92%, wean to room air as needed during the day  - continue home 2L of oxygen qhs   # Unsteady gait: new onset. Patient does walk with walker at home but able to ambulate fine. No significant strength deficit on exam. Patient has a left foot drop on exam which appears to be chronic and patient has been walking with this disability prior. No bladder/bowel incontinence.  - PT/OT recommends CIR; consult placed -may be due to some  vestibulocochlear abnormalities. Will try and perform a Dix-Hallpike.  # HFpEF: Last EF 63% on Lexiscan stress test in August 2015 with normal study. Patient takes Lasix 1601mID and metolazone 5mg80mce weekly on Mondays. Per Cardiology note on 9/1, patient has a dry weight of 270lbs (however patient has not been this weight since 2013) and baseline proBNP of 4000.  - continue home Coreg 3.125mg56m   # Alcohol abuse: heavy drinker. Drinks at least 4-8oz of liquor most days out of the week. Knows he needs to quit drinking. Drinking seems to have worsened since receiving diagnosis of breast cancer.  - CIWA protocol  - EtOH level  - UDS +opiates  - Social work consult   #Arthralgias - Patient has history of gout. May be an acute polyarticular gout flare caused by alcohol use. Improved. -elevated uric acid 11 and ESR 48 -ANA and RF negative -systemic steroid, colchicine and allopurinol started -will continue to monitor  # Diabetes mellitus, type 2: last A1C on file of 6.7 on 04/2013. On Lantus 8 units qhs  - Lantus 8u QHS  -will add Novolog 3 units tid with meals for better coverage since patient on steroids as well. -> per diabetes coordinator recs - SSI sensitive  - Hemoglobin A1c 6.8 (07/21/14)  # Atrial fibrillation: currently rate controlled. Per cardiology notes, not a candidate for anticoagulation because of alcohol abuse and high fall risk. - continue coreg 3.125mg 73melemetery   #Hypothyroidim - Last TSH checked in 2013 and was 10. -recheck TSH was 5.74 -increased home synthroid to 187 from 150  -will need  a recheck in 6-8wks  #Elevated BPs - highest in the last 24hrs 164/86 -Consider adding low dose ACE for better BP control and for renal protection.   # Coronary artery disease: history of CABG x4 in 2011, L CEA in 2011  - continue coreg 3.165m BID  - continue aspirin 855mdaily  - continue atorvastatin 2063maily   # Chronic back pain  - continue home oxycodone 58m43m8hrs PRN   FEN/GI: SLIV, cardiac/Carb modified diet  Prophylaxis: heparin subq  Disposition: Continue current management as above; pending placement.  Subjective:  Patient still with multiple complaints this morning. Still having some dizziness and weakness. However says his joints feel much better.  He also endorses that his breathing is improved. He had no overnight events.  Objective: Temp:  [97.8 F (36.6 C)-98.4 F (36.9 C)] 97.8 F (36.6 C) (10/13 0508) Pulse Rate:  [70-78] 70 (10/13 0508) Resp:  [18-19] 18 (10/13 0508) BP: (147-164)/(86-91) 147/88 mmHg (10/13 0508) SpO2:  [96 %-98 %] 98 % (10/13 0508) Weight:  [233 lb 6.4 oz (105.87 kg)] 233 lb 6.4 oz (105.87 kg) (10/13 0508)  Intake/Output Summary (Last 24 hours) at 07/23/14 0915 Last data filed at 07/23/14 0730  Gross per 24 hour  Intake    520 ml  Output   1850 ml  Net  -1330 ml   Physical Exam: General: NAD, elderly chronically ill appearing male, sitting up in bed eating breakfast Cardiovascular: Irregular rhythm, regular rate, no murmurs  Respiratory: CTAB, no wheezes or crackles Abdomen: Soft, non-tender, non-distended, +BS Extremities: Trace edema of lower extremities with left greater than right. Legs are erythematous. No cyanosis or clubbing. No rash.  Warm. Unable to palpate pulses.  Laboratory:  Recent Labs Lab 07/21/14 1300 07/22/14 0410  WBC 5.4 7.3  HGB 12.8* 11.9*  HCT 38.4* 37.7*  PLT 129* 145*    Recent Labs Lab 07/21/14 1300 07/22/14 0410  NA 139 144  K 3.7 3.7  CL 94* 99  CO2 34* 33*  BUN 74* 64*  CREATININE 1.09 1.00  CALCIUM 9.5 9.3  PROT 7.5  --   BILITOT 0.4  --   ALKPHOS 78  --   ALT 10  --   AST 17  --   GLUCOSE 210* 174*   UDS +Opiates BNP 3542  Imaging/Diagnostic Tests: Dg Chest 2 View (if Patient Has Fever And/or Copd) 07/21/2014  IMPRESSION: Mild cardiac enlargement.  No edema or consolidation.     Dg Lumbar Spine Complete 07/21/2014  IMPRESSION: Extensive  spondylosis and osteoarthritic change. 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 Slight spondylolisthesis at L4-5 is felt to be due to underlying spondylosis. No acute fracture. Mild scoliosis.     Ct Head Wo Contrast 07/21/2014  IMPRESSION: 1. No acute intracranial abnormality. 2. Moderate generalized atrophy, progressive since 2011. Stable mild chronic microvascular ischemic changes of the white matter.     JazmKatheren Shams 07/23/2014, 9:15 AM PGY-1, [facility] pager: [phone] pages welcome

## 2014-07-24 ENCOUNTER — Inpatient Hospital Stay (HOSPITAL_COMMUNITY)
Admission: RE | Admit: 2014-07-24 | Discharge: 2014-08-02 | DRG: 093 | Disposition: A | Discharge status: Home Health | Payer: Medicare Other | Source: Intra-hospital | Attending: Physical Medicine & Rehabilitation | Admitting: Physical Medicine & Rehabilitation

## 2014-07-24 DIAGNOSIS — C50929 Malignant neoplasm of unspecified site of unspecified male breast: Secondary | ICD-10-CM | POA: Diagnosis not present

## 2014-07-24 DIAGNOSIS — I482 Chronic atrial fibrillation, unspecified: Secondary | ICD-10-CM

## 2014-07-24 DIAGNOSIS — E1142 Type 2 diabetes mellitus with diabetic polyneuropathy: Secondary | ICD-10-CM | POA: Diagnosis not present

## 2014-07-24 DIAGNOSIS — M1000 Idiopathic gout, unspecified site: Secondary | ICD-10-CM

## 2014-07-24 DIAGNOSIS — Z23 Encounter for immunization: Secondary | ICD-10-CM

## 2014-07-24 DIAGNOSIS — R42 Dizziness and giddiness: Secondary | ICD-10-CM

## 2014-07-24 DIAGNOSIS — Z87891 Personal history of nicotine dependence: Secondary | ICD-10-CM | POA: Diagnosis not present

## 2014-07-24 DIAGNOSIS — M21372 Foot drop, left foot: Secondary | ICD-10-CM | POA: Diagnosis present

## 2014-07-24 DIAGNOSIS — I5033 Acute on chronic diastolic (congestive) heart failure: Secondary | ICD-10-CM

## 2014-07-24 DIAGNOSIS — I610 Nontraumatic intracerebral hemorrhage in hemisphere, subcortical: Secondary | ICD-10-CM

## 2014-07-24 DIAGNOSIS — IMO0002 Reserved for concepts with insufficient information to code with codable children: Secondary | ICD-10-CM

## 2014-07-24 DIAGNOSIS — M1029 Drug-induced gout, multiple sites: Secondary | ICD-10-CM

## 2014-07-24 DIAGNOSIS — R270 Ataxia, unspecified: Principal | ICD-10-CM | POA: Diagnosis present

## 2014-07-24 DIAGNOSIS — N179 Acute kidney failure, unspecified: Secondary | ICD-10-CM

## 2014-07-24 DIAGNOSIS — N185 Chronic kidney disease, stage 5: Secondary | ICD-10-CM | POA: Diagnosis not present

## 2014-07-24 DIAGNOSIS — J302 Other seasonal allergic rhinitis: Secondary | ICD-10-CM

## 2014-07-24 DIAGNOSIS — M479 Spondylosis, unspecified: Secondary | ICD-10-CM | POA: Diagnosis not present

## 2014-07-24 DIAGNOSIS — E1165 Type 2 diabetes mellitus with hyperglycemia: Secondary | ICD-10-CM

## 2014-07-24 DIAGNOSIS — E1022 Type 1 diabetes mellitus with diabetic chronic kidney disease: Secondary | ICD-10-CM | POA: Diagnosis not present

## 2014-07-24 DIAGNOSIS — M10021 Idiopathic gout, right elbow: Secondary | ICD-10-CM

## 2014-07-24 DIAGNOSIS — Z7982 Long term (current) use of aspirin: Secondary | ICD-10-CM

## 2014-07-24 DIAGNOSIS — I614 Nontraumatic intracerebral hemorrhage in cerebellum: Secondary | ICD-10-CM

## 2014-07-24 DIAGNOSIS — M21371 Foot drop, right foot: Secondary | ICD-10-CM | POA: Diagnosis not present

## 2014-07-24 DIAGNOSIS — E1140 Type 2 diabetes mellitus with diabetic neuropathy, unspecified: Secondary | ICD-10-CM | POA: Diagnosis present

## 2014-07-24 DIAGNOSIS — G4733 Obstructive sleep apnea (adult) (pediatric): Secondary | ICD-10-CM | POA: Diagnosis present

## 2014-07-24 DIAGNOSIS — M19042 Primary osteoarthritis, left hand: Secondary | ICD-10-CM

## 2014-07-24 DIAGNOSIS — E785 Hyperlipidemia, unspecified: Secondary | ICD-10-CM | POA: Diagnosis present

## 2014-07-24 DIAGNOSIS — I5032 Chronic diastolic (congestive) heart failure: Secondary | ICD-10-CM | POA: Diagnosis present

## 2014-07-24 DIAGNOSIS — F1010 Alcohol abuse, uncomplicated: Secondary | ICD-10-CM | POA: Diagnosis present

## 2014-07-24 DIAGNOSIS — I129 Hypertensive chronic kidney disease with stage 1 through stage 4 chronic kidney disease, or unspecified chronic kidney disease: Secondary | ICD-10-CM | POA: Diagnosis not present

## 2014-07-24 DIAGNOSIS — J449 Chronic obstructive pulmonary disease, unspecified: Secondary | ICD-10-CM | POA: Diagnosis not present

## 2014-07-24 DIAGNOSIS — Z79891 Long term (current) use of opiate analgesic: Secondary | ICD-10-CM | POA: Diagnosis not present

## 2014-07-24 DIAGNOSIS — Z951 Presence of aortocoronary bypass graft: Secondary | ICD-10-CM | POA: Diagnosis not present

## 2014-07-24 DIAGNOSIS — I119 Hypertensive heart disease without heart failure: Secondary | ICD-10-CM

## 2014-07-24 DIAGNOSIS — Z794 Long term (current) use of insulin: Secondary | ICD-10-CM

## 2014-07-24 DIAGNOSIS — M1990 Unspecified osteoarthritis, unspecified site: Secondary | ICD-10-CM | POA: Diagnosis present

## 2014-07-24 DIAGNOSIS — E039 Hypothyroidism, unspecified: Secondary | ICD-10-CM | POA: Diagnosis present

## 2014-07-24 DIAGNOSIS — M109 Gout, unspecified: Secondary | ICD-10-CM | POA: Diagnosis not present

## 2014-07-24 DIAGNOSIS — E1151 Type 2 diabetes mellitus with diabetic peripheral angiopathy without gangrene: Secondary | ICD-10-CM

## 2014-07-24 DIAGNOSIS — Z79899 Other long term (current) drug therapy: Secondary | ICD-10-CM | POA: Diagnosis not present

## 2014-07-24 DIAGNOSIS — R5381 Other malaise: Secondary | ICD-10-CM | POA: Diagnosis present

## 2014-07-24 DIAGNOSIS — I639 Cerebral infarction, unspecified: Secondary | ICD-10-CM

## 2014-07-24 DIAGNOSIS — B370 Candidal stomatitis: Secondary | ICD-10-CM | POA: Diagnosis not present

## 2014-07-24 DIAGNOSIS — I619 Nontraumatic intracerebral hemorrhage, unspecified: Secondary | ICD-10-CM | POA: Clinically undetermined

## 2014-07-24 DIAGNOSIS — N189 Chronic kidney disease, unspecified: Secondary | ICD-10-CM

## 2014-07-24 DIAGNOSIS — M19041 Primary osteoarthritis, right hand: Secondary | ICD-10-CM | POA: Diagnosis present

## 2014-07-24 LAB — GLUCOSE, CAPILLARY
GLUCOSE-CAPILLARY: 141 mg/dL — AB (ref 70–99)
GLUCOSE-CAPILLARY: 226 mg/dL — AB (ref 70–99)
Glucose-Capillary: 127 mg/dL — ABNORMAL HIGH (ref 70–99)
Glucose-Capillary: 198 mg/dL — ABNORMAL HIGH (ref 70–99)

## 2014-07-24 MED ORDER — INSULIN GLARGINE 100 UNIT/ML ~~LOC~~ SOLN
8.0000 [IU] | Freq: Every day | SUBCUTANEOUS | Status: DC
Start: 2014-07-24 — End: 2014-08-02
  Administered 2014-07-24 – 2014-08-01 (×9): 8 [IU] via SUBCUTANEOUS
  Filled 2014-07-24 (×10): qty 0.08

## 2014-07-24 MED ORDER — OXYCODONE HCL 5 MG PO TABS
10.0000 mg | ORAL_TABLET | Freq: Four times a day (QID) | ORAL | Status: DC | PRN
  Administered 2014-07-24 – 2014-08-02 (×26): 10 mg via ORAL
  Filled 2014-07-24 (×27): qty 2

## 2014-07-24 MED ORDER — LEVOTHYROXINE SODIUM 125 MCG PO TABS: 187.0000 ug | ORAL_TABLET | Freq: Every day | ORAL | Status: DC

## 2014-07-24 MED ORDER — PREDNISONE 20 MG PO TABS
30.0000 mg | ORAL_TABLET | Freq: Every day | ORAL | Status: AC
  Administered 2014-07-25: 30 mg via ORAL
  Filled 2014-07-24: qty 1

## 2014-07-24 MED ORDER — ATORVASTATIN CALCIUM 20 MG PO TABS
20.0000 mg | ORAL_TABLET | Freq: Every day | ORAL | Status: DC
  Administered 2014-07-25 – 2014-08-02 (×9): 20 mg via ORAL
  Filled 2014-07-24 (×10): qty 1

## 2014-07-24 MED ORDER — COLCHICINE 0.6 MG PO TABS: 0.6000 mg | ORAL_TABLET | Freq: Every day | ORAL | Status: DC

## 2014-07-24 MED ORDER — ASPIRIN 81 MG PO CHEW
81.0000 mg | CHEWABLE_TABLET | Freq: Every day | ORAL | Status: DC
  Administered 2014-07-25 – 2014-07-31 (×7): 81 mg via ORAL
  Filled 2014-07-24 (×7): qty 1

## 2014-07-24 MED ORDER — LORAZEPAM 1 MG PO TABS: 0.0000 mg | ORAL_TABLET | Freq: Two times a day (BID) | ORAL | Status: AC

## 2014-07-24 MED ORDER — PROCHLORPERAZINE EDISYLATE 5 MG/ML IJ SOLN
5.0000 mg | Freq: Four times a day (QID) | INTRAMUSCULAR | Status: DC | PRN
  Filled 2014-07-24: qty 2

## 2014-07-24 MED ORDER — ACETAMINOPHEN 325 MG PO TABS
325.0000 mg | ORAL_TABLET | ORAL | Status: DC | PRN
  Administered 2014-07-25 – 2014-07-30 (×3): 650 mg via ORAL
  Administered 2014-07-30: 325 mg via ORAL
  Filled 2014-07-24 (×5): qty 2

## 2014-07-24 MED ORDER — INSULIN ASPART 100 UNIT/ML ~~LOC~~ SOLN
3.0000 [IU] | Freq: Three times a day (TID) | SUBCUTANEOUS | Status: DC
  Administered 2014-07-25 – 2014-08-02 (×24): 3 [IU] via SUBCUTANEOUS

## 2014-07-24 MED ORDER — TRAZODONE HCL 50 MG PO TABS
25.0000 mg | ORAL_TABLET | Freq: Every evening | ORAL | Status: DC | PRN
  Administered 2014-07-27: 50 mg via ORAL
  Filled 2014-07-24: qty 1

## 2014-07-24 MED ORDER — PREDNISONE 10 MG PO TABS
10.0000 mg | ORAL_TABLET | Freq: Every day | ORAL | Status: AC
  Administered 2014-07-27: 10 mg via ORAL
  Filled 2014-07-24: qty 1

## 2014-07-24 MED ORDER — PANTOPRAZOLE SODIUM 40 MG PO TBEC
40.0000 mg | DELAYED_RELEASE_TABLET | Freq: Every day | ORAL | Status: DC
  Administered 2014-07-25 – 2014-08-02 (×9): 40 mg via ORAL
  Filled 2014-07-24 (×9): qty 1

## 2014-07-24 MED ORDER — CARVEDILOL 3.125 MG PO TABS
3.1250 mg | ORAL_TABLET | Freq: Two times a day (BID) | ORAL | Status: DC
  Administered 2014-07-25 – 2014-08-02 (×17): 3.125 mg via ORAL
  Filled 2014-07-24 (×19): qty 1

## 2014-07-24 MED ORDER — PREDNISONE 20 MG PO TABS
20.0000 mg | ORAL_TABLET | Freq: Every day | ORAL | Status: AC
  Administered 2014-07-26: 20 mg via ORAL
  Filled 2014-07-24: qty 1

## 2014-07-24 MED ORDER — FUROSEMIDE 80 MG PO TABS
160.0000 mg | ORAL_TABLET | Freq: Two times a day (BID) | ORAL | Status: DC
  Administered 2014-07-25 – 2014-07-27 (×5): 160 mg via ORAL
  Filled 2014-07-24 (×7): qty 2

## 2014-07-24 MED ORDER — ALLOPURINOL 300 MG PO TABS: 300.0000 mg | ORAL_TABLET | Freq: Every day | ORAL | Status: DC

## 2014-07-24 MED ORDER — GABAPENTIN 300 MG PO CAPS
300.0000 mg | ORAL_CAPSULE | Freq: Every day | ORAL | Status: DC
  Administered 2014-07-25: 300 mg via ORAL
  Filled 2014-07-24 (×2): qty 1

## 2014-07-24 MED ORDER — ALUM & MAG HYDROXIDE-SIMETH 200-200-20 MG/5ML PO SUSP: 30.0000 mL | ORAL | Status: DC | PRN

## 2014-07-24 MED ORDER — PROCHLORPERAZINE 25 MG RE SUPP
12.5000 mg | Freq: Four times a day (QID) | RECTAL | Status: DC | PRN
  Filled 2014-07-24: qty 1

## 2014-07-24 MED ORDER — COLCHICINE 0.6 MG PO TABS
0.6000 mg | ORAL_TABLET | Freq: Two times a day (BID) | ORAL | Status: DC
  Administered 2014-07-24 – 2014-08-02 (×18): 0.6 mg via ORAL
  Filled 2014-07-24 (×20): qty 1

## 2014-07-24 MED ORDER — FLUOXETINE HCL 20 MG PO CAPS
20.0000 mg | ORAL_CAPSULE | Freq: Every day | ORAL | Status: DC
  Administered 2014-07-25 – 2014-08-02 (×9): 20 mg via ORAL
  Filled 2014-07-24 (×10): qty 1

## 2014-07-24 MED ORDER — PROCHLORPERAZINE MALEATE 5 MG PO TABS
5.0000 mg | ORAL_TABLET | Freq: Four times a day (QID) | ORAL | Status: DC | PRN
  Filled 2014-07-24: qty 2

## 2014-07-24 MED ORDER — ADULT MULTIVITAMIN W/MINERALS CH
1.0000 | ORAL_TABLET | Freq: Every day | ORAL | Status: DC
  Administered 2014-07-25 – 2014-08-02 (×9): 1 via ORAL
  Filled 2014-07-24 (×10): qty 1

## 2014-07-24 MED ORDER — FLEET ENEMA 7-19 GM/118ML RE ENEM: 1.0000 | ENEMA | Freq: Once | RECTAL | Status: AC | PRN

## 2014-07-24 MED ORDER — INSULIN ASPART 100 UNIT/ML ~~LOC~~ SOLN
0.0000 [IU] | Freq: Three times a day (TID) | SUBCUTANEOUS | Status: DC
  Administered 2014-07-25 – 2014-07-26 (×2): 2 [IU] via SUBCUTANEOUS
  Administered 2014-07-26: 1 [IU] via SUBCUTANEOUS
  Administered 2014-07-26 – 2014-07-27 (×2): 2 [IU] via SUBCUTANEOUS
  Administered 2014-07-27: 1 [IU] via SUBCUTANEOUS
  Administered 2014-07-27: 3 [IU] via SUBCUTANEOUS
  Administered 2014-07-28 – 2014-07-29 (×5): 1 [IU] via SUBCUTANEOUS
  Administered 2014-07-30 (×2): 2 [IU] via SUBCUTANEOUS
  Administered 2014-07-31: 1 [IU] via SUBCUTANEOUS
  Administered 2014-07-31: 2 [IU] via SUBCUTANEOUS
  Administered 2014-07-31 – 2014-08-01 (×2): 1 [IU] via SUBCUTANEOUS
  Administered 2014-08-01: 3 [IU] via SUBCUTANEOUS
  Administered 2014-08-01 (×2): 1 [IU] via SUBCUTANEOUS
  Administered 2014-08-02: 2 [IU] via SUBCUTANEOUS

## 2014-07-24 MED ORDER — ALLOPURINOL 300 MG PO TABS
300.0000 mg | ORAL_TABLET | Freq: Every day | ORAL | Status: DC
  Administered 2014-07-25 – 2014-08-02 (×9): 300 mg via ORAL
  Filled 2014-07-24 (×10): qty 1

## 2014-07-24 MED ORDER — LACTULOSE 10 GM/15ML PO SOLN
20.0000 g | Freq: Two times a day (BID) | ORAL | Status: DC
  Administered 2014-07-24 – 2014-08-01 (×13): 20 g via ORAL
  Filled 2014-07-24 (×20): qty 30

## 2014-07-24 MED ORDER — FLUTICASONE PROPIONATE HFA 110 MCG/ACT IN AERO
1.0000 | INHALATION_SPRAY | Freq: Two times a day (BID) | RESPIRATORY_TRACT | Status: DC
  Administered 2014-07-24 – 2014-08-02 (×18): 1 via RESPIRATORY_TRACT
  Filled 2014-07-24: qty 12

## 2014-07-24 MED ORDER — POTASSIUM CHLORIDE CRYS ER 20 MEQ PO TBCR
20.0000 meq | EXTENDED_RELEASE_TABLET | Freq: Every day | ORAL | Status: DC
  Administered 2014-07-25 – 2014-08-02 (×9): 20 meq via ORAL
  Filled 2014-07-24 (×10): qty 1

## 2014-07-24 MED ORDER — BISACODYL 10 MG RE SUPP: 10.0000 mg | Freq: Every day | RECTAL | Status: DC | PRN

## 2014-07-24 MED ORDER — LEVOTHYROXINE SODIUM 50 MCG PO TABS
187.0000 ug | ORAL_TABLET | Freq: Every day | ORAL | Status: DC
  Administered 2014-07-25 – 2014-08-02 (×9): 187 ug via ORAL
  Filled 2014-07-24 (×10): qty 1

## 2014-07-24 MED ORDER — OXYCODONE HCL 5 MG PO TABS
15.0000 mg | ORAL_TABLET | Freq: Three times a day (TID) | ORAL | Status: DC | PRN
  Administered 2014-07-24: 15 mg via ORAL
  Filled 2014-07-24: qty 3

## 2014-07-24 MED ORDER — PREDNISONE 5 MG PO TABS
5.0000 mg | ORAL_TABLET | Freq: Every day | ORAL | Status: AC
  Administered 2014-07-28: 5 mg via ORAL
  Filled 2014-07-24: qty 1

## 2014-07-24 MED ORDER — GUAIFENESIN-DM 100-10 MG/5ML PO SYRP: 5.0000 mL | ORAL_SOLUTION | Freq: Four times a day (QID) | ORAL | Status: DC | PRN

## 2014-07-24 MED ORDER — ENOXAPARIN SODIUM 40 MG/0.4ML ~~LOC~~ SOLN
40.0000 mg | SUBCUTANEOUS | Status: DC
  Administered 2014-07-24 – 2014-07-30 (×7): 40 mg via SUBCUTANEOUS
  Filled 2014-07-24 (×8): qty 0.4

## 2014-07-24 MED ORDER — INFLUENZA VAC SPLIT QUAD 0.5 ML IM SUSY
0.5000 mL | PREFILLED_SYRINGE | INTRAMUSCULAR | Status: AC
  Administered 2014-07-25: 0.5 mL via INTRAMUSCULAR
  Filled 2014-07-24: qty 0.5

## 2014-07-24 MED ORDER — FOLIC ACID 1 MG PO TABS
1.0000 mg | ORAL_TABLET | Freq: Every day | ORAL | Status: DC
  Administered 2014-07-25 – 2014-08-02 (×9): 1 mg via ORAL
  Filled 2014-07-24 (×10): qty 1

## 2014-07-24 NOTE — Progress Notes (Signed)
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 Allopurinol added yesterday. Feeling better. Denies dyspnea. Weight below baseline on po diuretics. Approved for CIR.   Had transient diplopia yesterday. No amaurosis or associated sx. Balance is poor.   Objective:   Weight Range:  Vital Signs:   Temp:  [97.8 F (36.6 C)-98.3 F (36.8 C)] 98.3 F (36.8 C) (10/13 2108) Pulse Rate:  [71-77] 74 (10/13 2108) BP: (145-157)/(74-89) 151/79 mmHg (10/13 2108) SpO2:  [97 %-98 %] 97 % (10/13 2108)    Weight change: Filed Weights   07/21/14 1239 07/21/14 1854 07/23/14 0508  Weight: 108.863 kg (240 lb) 108.818 kg (239 lb 14.4 oz) 105.87 kg (233 lb 6.4 oz)    Intake/Output:   Intake/Output Summary (Last 24 hours) at 07/24/14 0802 Last data filed at 07/24/14 0337  Gross per 24 hour  Intake    240 ml  Output   1200 ml  Net   -960 ml     Physical Exam:  General: Chronically ill appearing. No resp difficulty.  HEENT: normal  Neck: supple. JVP appears 6 cm Carotids 2+ bilaterally; no bruits. No lymphadenopathy or thryomegaly appreciated.  Cor: PMI normal. Irregular rate & irregular rhythm. No rubs, gallops. 1/6 SEM RUSB  Lungs: CTA  Abdomen: obese, soft, nontender, mildly distended. No hepatosplenomegaly. No bruits or masses. Good bowel sounds.  Extremities: no cyanosis, clubbing, rash, trace edema (L>R, chronic) Shoulder and elbow ROM much improved. Neuro: alert & orientedx3, cranial nerves grossly intact. Moves all 4 extremities w/o difficulty. Affect pleasant.   Telemetry: AF 60s   Labs: Basic Metabolic Panel:  Recent Labs Lab 07/21/14 1300 07/22/14 0410  NA 139 144  K 3.7 3.7  CL 94* 99  CO2 34* 33*  GLUCOSE 210* 174*  BUN 74* 64*  CREATININE 1.09 1.00  CALCIUM 9.5 9.3    Liver Function Tests:  Recent Labs Lab 07/21/14 1300  AST 17  ALT 10  ALKPHOS 78  BILITOT 0.4  PROT 7.5  ALBUMIN 3.1*   No  results found for this basename: LIPASE, AMYLASE,  in the last 168 hours No results found for this basename: AMMONIA,  in the last 168 hours  CBC:  Recent Labs Lab 07/21/14 1300 07/22/14 0410  WBC 5.4 7.3  HGB 12.8* 11.9*  HCT 38.4* 37.7*  MCV 94.1 95.9  PLT 129* 145*    Cardiac Enzymes: No results found for this basename: CKTOTAL, CKMB, CKMBINDEX, TROPONINI,  in the last 168 hours  BNP: BNP (last 3 results)  Recent Labs  04/20/14 1655 06/11/14 1624 07/21/14 1300  PROBNP 17870.0* 4440.0* 3542.0*     Other results:    Imaging: No results found.   Medications:     Scheduled Medications: . allopurinol  300 mg Oral Daily  . aspirin  81 mg Oral Daily  . atorvastatin  20 mg Oral Daily  . carvedilol  3.125 mg Oral BID WC  . colchicine  0.6 mg Oral BID  . FLUoxetine  20 mg Oral Daily  . fluticasone  1 puff Inhalation BID  . folic acid  1 mg Oral Daily  . furosemide  160 mg Oral BID  . gabapentin  300 mg Oral Daily  . heparin  5,000 Units Subcutaneous 3 times per day  . insulin aspart  0-9 Units Subcutaneous TID WC  . insulin aspart  3 Units Subcutaneous TID WC  . insulin glargine  8 Units Subcutaneous  QHS  . levothyroxine  187 mcg Oral QAC breakfast  . LORazepam  0-4 mg Oral Q12H  . multivitamin with minerals  1 tablet Oral Daily  . pantoprazole  40 mg Oral Daily  . potassium chloride  20 mEq Oral Daily  . predniSONE  40 mg Oral Q breakfast  . sodium chloride  3 mL Intravenous Q12H  . sodium chloride  3 mL Intravenous Q12H  . thiamine  100 mg Oral Daily   Or  . thiamine  100 mg Intravenous Daily    Infusions:    PRN Medications: sodium chloride, acetaminophen, acetaminophen, LORazepam, LORazepam, oxyCODONE, senna, sodium chloride   Assessment:   1. Acute polyarticular gout  2. Chronic diastolic HF  3. ETOH abuse  4. CKD, Stage III (baseline ~1.4)  5. Chronic AF  6. Breast CA pending mastectomy 7. Debilitation/imbalance   Plan/Discussion:      He has responded very well to prednisone and colchicine. Looks much better. Volume status looks ok. Continue po diuretics. Continue allopurinol. Will need fast prednisone taper (we often do 40mg  x 3 days and stop). Will leave to [facility]PS discretion  Agree with CIR. Stable from our perspective for transfer there. No evidence DTs.   Not coumadin candidate.    Length of Stay: 3   Glori Bickers MD  07/24/2014, 8:02 AM  Advanced Heart Failure Team Pager [phone] (M-F; [place])  Please contact [facility] for night-coverage after hours (4p -7a ) and weekends on [email or web address]

## 2014-07-24 NOTE — Progress Notes (Signed)
Rehab admissions - Evaluated for possible admission.  I met with patient.  His daughter wants him to come to inpatient rehab.  He is now agreeable.  Bed available and will admit to acute inpatient rehab today.  Call me for questions.  #[phone]

## 2014-07-24 NOTE — Progress Notes (Signed)
Report called to [facility]Inpatient rehab. Erline Levine, RN received report. All belongings with pt. VSS. Lonia Skinner, Student-RN

## 2014-07-24 NOTE — PMR Pre-admission (Signed)
PMR Admission Coordinator Pre-Admission Assessment  Patient: [name] is an 68 y.o., male MRN: [id number] DOB: [birth year] Height: 5\' 7"  (170.2 cm) Weight: 105.87 kg (233 lb 6.4 oz)              Insurance Information HMO: No    PPO:       PCP:       IPA:       80/20:       OTHER:   PRIMARY: Medicare A/B      Policy#:  [id number]      Subscriber: [name] CM Name:        Phone#:       Fax#:   Pre-Cert#:        Employer: Retired Benefits:  Phone #:       Name: Checked in [place]. Date: 11/11/08     Deduct:  $1260      Out of Pocket Max: none      Life Max: unlimited CIR: 100%      SNF: 100 days Outpatient: 80%     Co-Pay: 20% Home Health: 100%      Co-Pay: none DME: 80%     Co-Pay: 20% Providers: patient's choice  SECONDARY: [employer]      Policy#: [id number]      Subscriber: Plan F CM Name:        Phone#:       Fax#:   Pre-Cert#:        Employer: Retired Benefits:  Phone #: [phone]     Name:   Eff. Date:       Deduct:        Out of Pocket Max:        Life Max:   CIR:        SNF:   Outpatient:       Co-Pay:   Home Health:        Co-Pay:   DME:       Co-Pay:     Emergency Contact Information Contact Information   Name Relation Home Work Mobile   Hardy,[name] Daughter   [phone]     Current Medical History  Patient Admitting Diagnosis:  Polyarticular gout, ETOH abuse  History of Present Illness: A 68 y.o. male with history of OSA, HTN, CAF, ongoing alcohol abuse, chronic pain; who was admitted on 07/21/14 with fatigue, increased weakness and BLE edema. He was treated with IV lasix due to complaints of SOB and Dr. Haroldine Laws consulted for input. CT head with moderate generalized atrophy with progression since 2011. He felt that volume status was stable but patient with polyarticular gout flare in setting of ETOH abuse and added steroids for treatment. Uric acid 11.1 with TSH 5.74. X rays lumbar spine with extensive spondylosis and osteoarthritic change with  diffuse idiopathic skeletal hyperostosis. Therapy evaluation done and patient with significant balance deficits with ataxia as well as difficulty with mobility due to gout flare. CIR recommended by MD and therapy team.    Past Medical History  Past Medical History  Diagnosis Date  . CHRONIC OBSTRUCTIVE PULMONARY DISEASE 06/20/2009  . OBSTRUCTIVE SLEEP APNEA 06/20/2009  . CAROTID STENOSIS 06/20/2009    A. 08/2001 s/p L CEA;  B.   09/14/11 - Carotid U/S - 40-59% bilateral stenosis, left CEA patch angioplasty is patent  . DM 06/20/2009  . CAD 06/20/2009    A.  08/2000 - s/p CABG x 4 - LIMA-LAD, Left Radial-OM, VG-DIAG, VG-RCA;  B. Neg.  MV  2010  . HYPERLIPIDEMIA 06/20/2009  . HYPERTENSION 06/20/2009  . Hypothyroidism   . Low back pain   . Asthma     as child  . Pneumonia   . Atrial fibrillation     Not felt to be coumadin candidate 2/2 ETOH use.  Marland Kitchen ETOH abuse   . History of tobacco abuse     remote - quit 1970  . Bilateral renal cysts   . Marijuana abuse   . Morbidly obese   . CHF (congestive heart failure)   . Falls frequently   . Hx of cardiovascular stress test 05/2014    Lexiscan Myoview (8/15):  No ischemia; EF 63% - Normal Study  . Breast cancer, right breast   . Gout     Family History  family history includes Cancer in his mother; Cancer (age of onset: 70) in his brother; Stroke in his father and mother. There is no history of Heart attack.  Prior Rehab/Hospitalizations:  Has been to [facility] X [facility] previously and was there recently.  Had [facility] therapies after discharge from SNF.   Current Medications  Current facility-administered medications:0.9 %  sodium chloride infusion, 250 mL, Intravenous, PRN, Cordelia Poche, MD;  acetaminophen (TYLENOL) suppository 650 mg, 650 mg, Rectal, Q6H PRN, Cordelia Poche, MD;  acetaminophen (TYLENOL) tablet 650 mg, 650 mg, Oral, Q6H PRN, Cordelia Poche, MD, 650 mg at 07/23/14 1718;  allopurinol (ZYLOPRIM) tablet 300 mg, 300 mg, Oral, Daily, Jolaine Artist, MD, 300 mg at 07/24/14 1109 aspirin chewable tablet 81 mg, 81 mg, Oral, Daily, Cordelia Poche, MD, 81 mg at 07/24/14 1109;  atorvastatin (LIPITOR) tablet 20 mg, 20 mg, Oral, Daily, Cordelia Poche, MD, 20 mg at 07/24/14 1108;  carvedilol (COREG) tablet 3.125 mg, 3.125 mg, Oral, BID WC, Cordelia Poche, MD, 3.125 mg at 07/24/14 0830;  colchicine tablet 0.6 mg, 0.6 mg, Oral, BID, Jolaine Artist, MD, 0.6 mg at 07/24/14 1109 FLUoxetine (PROZAC) capsule 20 mg, 20 mg, Oral, Daily, Cordelia Poche, MD, 20 mg at 07/24/14 1109;  fluticasone (FLOVENT HFA) 110 MCG/ACT inhaler 1 puff, 1 puff, Inhalation, BID, Lind Covert, MD, 1 puff at 03/00/92 3300;  folic acid (FOLVITE) tablet 1 mg, 1 mg, Oral, Daily, Cordelia Poche, MD, 1 mg at 07/24/14 1109;  furosemide (LASIX) tablet 160 mg, 160 mg, Oral, BID, Rande Brunt, NP, 160 mg at 07/24/14 0830 gabapentin (NEURONTIN) capsule 300 mg, 300 mg, Oral, Daily, Cordelia Poche, MD, 300 mg at 07/24/14 1109;  heparin injection 5,000 Units, 5,000 Units, Subcutaneous, 3 times per day, Cordelia Poche, MD, 5,000 Units at 07/24/14 1351;  insulin aspart (novoLOG) injection 0-9 Units, 0-9 Units, Subcutaneous, TID WC, Cordelia Poche, MD, 1 Units at 07/24/14 1241 insulin aspart (novoLOG) injection 3 Units, 3 Units, Subcutaneous, TID WC, Katheren Shams, DO, 3 Units at 07/24/14 1241;  insulin glargine (LANTUS) injection 8 Units, 8 Units, Subcutaneous, QHS, Cordelia Poche, MD, 8 Units at 07/23/14 2144;  levothyroxine (SYNTHROID, LEVOTHROID) tablet 187 mcg, 187 mcg, Oral, QAC breakfast, Katheren Shams, DO, 187 mcg at 07/24/14 1108;  LORazepam (ATIVAN) injection 1 mg, 1 mg, Intravenous, Q6H PRN, Cordelia Poche, MD LORazepam (ATIVAN) tablet 0-4 mg, 0-4 mg, Oral, Q12H, Cordelia Poche, MD;  LORazepam (ATIVAN) tablet 1 mg, 1 mg, Oral, Q6H PRN, Cordelia Poche, MD, 1 mg at 07/22/14 2314;  multivitamin with minerals tablet 1 tablet, 1 tablet, Oral, Daily, Cordelia Poche, MD, 1 tablet at 07/24/14 1109;  oxyCODONE  (Oxy IR/ROXICODONE) immediate release tablet  15 mg, 15 mg, Oral, Q8H PRN, Alveda Reasons, MD, 15 mg at 07/24/14 1240 pantoprazole (PROTONIX) EC tablet 40 mg, 40 mg, Oral, Daily, Cordelia Poche, MD, 40 mg at 07/24/14 1109;  potassium chloride SA (K-DUR,KLOR-CON) CR tablet 20 mEq, 20 mEq, Oral, Daily, Cordelia Poche, MD, 20 mEq at 07/24/14 1109;  senna (SENOKOT) tablet 8.6 mg, 1 tablet, Oral, BID PRN, Cordelia Poche, MD, 8.6 mg at 07/23/14 1014;  sodium chloride 0.9 % injection 3 mL, 3 mL, Intravenous, Q12H, Cordelia Poche, MD, 3 mL at 07/24/14 1108 sodium chloride 0.9 % injection 3 mL, 3 mL, Intravenous, Q12H, Cordelia Poche, MD, 3 mL at 07/24/14 0831;  sodium chloride 0.9 % injection 3 mL, 3 mL, Intravenous, PRN, Cordelia Poche, MD, 3 mL at 07/22/14 1100;  thiamine (VITAMIN B-1) tablet 100 mg, 100 mg, Oral, Daily, Cordelia Poche, MD, 100 mg at 07/24/14 1109  Patients Current Diet: Heart Healthy, carb mod, thin liquids  Precautions / Restrictions Precautions Precautions: None Restrictions Weight Bearing Restrictions: No   Prior Activity Level Household: Homebound for the past 3 weeks.  Went out 3 X a week up until 3 weeks ago and was driving himself.  Home Assistive Devices / Equipment Home Assistive Devices/Equipment: Environmental consultant (specify type) Home Equipment: Walker - 2 wheels;Grab bars - toilet;Grab bars - tub/shower  Prior Functional Level Prior Function Level of Independence: Independent with assistive device(s)  Current Functional Level Cognition  Overall Cognitive Status: Within Functional Limits for tasks assessed Orientation Level: Oriented X4    Extremity Assessment (includes Sensation/Coordination)  Upper Extremity Assessment: Defer to OT evaluation  Lower Extremity Assessment: RLE deficits/detail;LLE deficits/detail  RLE Deficits / Details: grossly >4/5 except trace df  LLE Deficits / Details: grossly >4/5  Cervical / Trunk Assessment: Normal    ADLs  Overall ADL's : Needs  assistance/impaired Grooming: Minimal assistance;Sitting Upper Body Bathing: Moderate assistance;Sitting Lower Body Bathing: Total assistance;+2 for safety/equipment;Sit to/from stand Upper Body Dressing : Minimal assistance;Sitting Lower Body Dressing: Total assistance;+2 for safety/equipment;Sit to/from stand Toilet Transfer: Moderate assistance;Stand-pivot;RW Toileting- Clothing Manipulation and Hygiene: Total assistance;Sit to/from stand;+2 for safety/equipment General ADL Comments: Patient limited by poor pulmonary/cardiovascular support and increased anxiety during session, Patient anxious with therapist assisting with bed mobility and for stand pivot transfer EOB>recliner. Patient would say " you can't do this by yourself" and "make sure you put that belt around me". Patient's daughter present half-way thru eval & treat. Daughter with multiple questions that this therapist deferred to patient's RN. Patient performed stand pivot transfer with mod assist and max verbal cues for safety; patient with an increased posterior lean during transfer. Patient also with complaints of being dizzy and nauseous; RN aware of this.     Mobility  Overal bed mobility: Needs Assistance Bed Mobility: Supine to Sit Rolling: Min assist Supine to sit: Min assist General bed mobility comments: assist due to back pain.    Transfers  Overall transfer level: Needs assistance Equipment used: Rolling walker (2 wheeled) Transfers: Sit to/from Stand Sit to Stand: Mod assist;+2 safety/equipment Stand pivot transfers: Mod assist General transfer comment: pt scooted to EOB and positioned legs appropriately without assist today.  Still mod assist to stand due to need to come forward, but generally much less assist.    Ambulation / Gait / Stairs / Wheelchair Mobility  Ambulation/Gait Ambulation/Gait assistance: Mod assist;Min assist;+2 safety/equipment Ambulation Distance (Feet): 20 Feet (excluding turns ) Assistive  device: Rolling walker (2 wheeled) Gait Pattern/deviations: Step-to pattern;Decreased step length - right;Decreased step  length - left;Decreased stride length;Ataxic Gait velocity: very slow and deliberate. General Gait Details: Pt able to follow v/t cues to laboriously execute w/shifts to unweight his feet.  Though uncoordinated, pt able to keep feet advancing forward into an appropriate placement within BOS.  Pt continued to list moderately heavily Left unless cued to correct to midline Right.  Worked on coordination of w/shifts and forward or backward stepping during turns.    Posture / Balance Dynamic Sitting Balance Sitting balance - Comments: improved scooting and balancing within BOS at EOB    Special needs/care consideration BiPAP/CPAP No CPM No Continuous Drip IV No Dialysis No       Life VestbNo Oxygen Yes, uses O2 at home 2L [place] Special Bed No Trach Size No Wound Vac (area) No     Skin Has bilateral edema and swelling both lower extremities. Also has an abrasion on his left knee.                             Bowel mgmt: No BM since admission, takes lactulose at home Bladder mgmt: Difficulty starting stream Diabetic mgmt Yes, on insulin at home    Previous Home Environment Living Arrangements: Children;Other relatives Available Help at Discharge: Family Type of Home: House Home Layout: One level Home Access: Stairs to enter Entrance Stairs-Rails: Can reach both Entrance Stairs-Number of Steps: 3 Bathroom Shower/Tub: Door;Walk-in Radio producer: Handicapped height Bathroom Accessibility: Yes How Accessible: Accessible via walker [place]: No  Discharge Living Setting Plans for Discharge Living Setting: Patient's home;House;Lives with (comment) (Dtr and son-in-law live with patient.) Type of Home at Discharge: House Discharge Home Layout: One level Discharge Home Access: Stairs to enter Entrance Stairs-Number of Steps: 3 Does the patient have any  problems obtaining your medications?: No  Social/Family/Support Systems Patient Roles: Parent (Has a daughter and son-in-law.) Contact Information: Darrold Junker - daughter Anticipated Caregiver: daughter and son-in-law Anticipated Caregiver's Contact Information: [name] [phone] Ability/Limitations of Caregiver: Dtr provided assistance with some ADLs PTA Caregiver Availability: Intermittent Discharge Plan Discussed with Primary Caregiver: Yes Is Caregiver In Agreement with Plan?: Yes Does Caregiver/Family have Issues with Lodging/Transportation while Pt is in Rehab?: No  Goals/Additional Needs Patient/Family Goal for Rehab: PT/OT supervision goals Expected length of stay: 8-10 days Cultural Considerations: None Dietary Needs: Heart healthy, carb mod, thin liquids Equipment Needs: TBD Pt/Family Agrees to Admission and willing to participate: Yes Program Orientation Provided & Reviewed with Pt/Caregiver Including Roles  & Responsibilities: Yes  Decrease burden of Care through IP rehab admission: N/A  Possible need for SNF placement upon discharge: Not planned  Patient Condition: This patient's condition remains as documented in the consult dated 07/23/14, in which the Rehabilitation Physician determined and documented that the patient's condition is appropriate for intensive rehabilitative care in an inpatient rehabilitation facility. Will admit to inpatient rehab today.  Preadmission Screen Completed By:  Retta Diones, 07/24/2014 2:30 PM ______________________________________________________________________   Discussed status with Dr. Letta Pate on 07/24/14 at 1430 and received telephone approval for admission today.  Admission Coordinator:  Retta Diones, time1430/Date10/14/13

## 2014-07-24 NOTE — Progress Notes (Signed)
CSW (Clinical Education officer, museum) aware of consult for SNF. Per notes, plan is for pt to dc to CIR today. Pt has no hospital social work needs.  [place], [place]

## 2014-07-24 NOTE — H&P (Signed)
Physical Medicine and Rehabilitation Admission H&P  Chief Complaint   Patient presents with   .  Gout flare   HPI: [name] is a 68 y.o. male with history of OSA, HTN, CAF, ongoing alcohol abuse, chronic pain; who was admitted on 07/21/14 with fatigue, increased weakness and BLE edema. He was treated with IV lasix due to complaints of SOB and Dr. Bensimhon consulted for input. CT head with moderate generalized atrophy with progression since 2011. He felt that volume status was stable but patient with polyarticular gout flare in setting of ETOH abuse and added steroids for treatment. Uric acid 11.1 with TSH 5.74. X rays lumbar spine with extensive spondylosis and osteoarthritic change with diffuse idiopathic skeletal hyperostosis. Therapy evaluation done and patient with significant balance deficits with ataxia as well as difficulty with mobility due to gout flare. CIR recommended by MD and therapy team.  Shoulders feel better, R elbow still tender  Review of Systems  Eyes: Positive for double vision (on and off. ).  Musculoskeletal: Positive for back pain, falls and myalgias.  Neurological: Positive for dizziness (with activity. ).   Past Medical History   Diagnosis  Date   .  CHRONIC OBSTRUCTIVE PULMONARY DISEASE  06/20/2009   .  OBSTRUCTIVE SLEEP APNEA  06/20/2009   .  CAROTID STENOSIS  06/20/2009     A. 08/2001 s/p L CEA; B. 09/14/11 - Carotid U/S - 40-59% bilateral stenosis, left CEA patch angioplasty is patent   .  DM  06/20/2009   .  CAD  06/20/2009     A. 08/2000 - s/p CABG x 4 - LIMA-LAD, Left Radial-OM, VG-DIAG, VG-RCA; B. Neg. MV 2010   .  HYPERLIPIDEMIA  06/20/2009   .  HYPERTENSION  06/20/2009   .  Hypothyroidism    .  Low back pain    .  Asthma      as child   .  Pneumonia    .  Atrial fibrillation      Not felt to be coumadin candidate 2/2 ETOH use.   .  ETOH abuse    .  History of tobacco abuse      remote - quit 1970   .  Bilateral renal cysts    .  Marijuana abuse     .  Morbidly obese    .  CHF (congestive heart failure)    .  Falls frequently    .  Hx of cardiovascular stress test  05/2014     Lexiscan Myoview (8/15): No ischemia; EF 63% - Normal Study   .  Breast cancer, right breast    .  Gout     Past Surgical History   Procedure  Laterality  Date   .  Carotid endarterectomy   2002     left   .  Coronary artery bypass graft       x 4 - 2001   .  Umbilical hernia repair  N/A  01/28/2014     Procedure: HERNIA REPAIR UMBILICAL ADULT/INC; Surgeon: Douglas A Blackman, MD; Location: [facility] OR; Service: General; Laterality: N/A;   .  Laparotomy  N/A  01/28/2014     Procedure: EXPLORATORY LAPAROTOMY; Surgeon: Douglas A Blackman, MD; Location: [facility] OR; Service: General; Laterality: N/A;   .  Bowel resection  N/A  01/28/2014     Procedure: SMALL BOWEL RESECTION; Surgeon: Douglas A Blackman, MD; Location: [facility] OR; Service: General; Laterality: N/A;   .  Hernia repair        Family History   Problem  Relation  Age of Onset   .  Stroke  Mother      ?   .  Cancer  Mother      unknown type of cancer   .  Stroke  Father      ?   .  Heart attack  Neg Hx    .  Cancer  Brother  72     stomach cancer    Social History: Lives with family--daughter at home. Independent with walker PTA. He reports that he quit smoking about 45 years ago. He does not have any smokeless tobacco history on file. He reports that he drinks a 1/5 of liquor daily. He reports that he uses illicit drugs (Marijuana).    Allergies   Allergen  Reactions   .  Erythromycin  Hives    Medications Prior to Admission   Medication  Sig  Dispense  Refill   .  aspirin 81 MG chewable tablet  Chew 1 tablet (81 mg total) by mouth daily.  30 tablet  1   .  atorvastatin (LIPITOR) 20 MG tablet  Take 20 mg by mouth daily.     .  carvedilol (COREG) 3.125 MG tablet  Take 1 tablet (3.125 mg total) by mouth 2 (two) times daily with a meal.  60 tablet  5   .  colchicine 0.6 MG tablet  Take 0.6 mg by mouth daily  as needed (for gout).     .  Dimethicone (MOISTURE BARRIER EX)  Apply 1 application topically daily as needed (Gout under stomach). Apply after showers     .  FLUoxetine (PROZAC) 20 MG capsule  Take 20 mg by mouth daily.     .  Fluticasone Furoate 200 MCG/ACT AEPB  Inhale 1 puff into the lungs 2 (two) times daily.     .  furosemide (LASIX) 80 MG tablet  Take 2 tablets (160 mg total) by mouth 2 (two) times daily.  120 tablet  5   .  gabapentin (NEURONTIN) 300 MG capsule  Take 1 capsule (300 mg total) by mouth daily.     .  Insulin Glargine (LANTUS SOLOSTAR) 100 UNIT/ML Solostar Pen  Inject 16 Units into the skin at bedtime.     .  lactulose (CHRONULAC) 10 GM/15ML solution  Take 20 g by mouth 2 (two) times daily.     .  levothyroxine (SYNTHROID, LEVOTHROID) 150 MCG tablet  Take 150 mcg by mouth at bedtime.     .  metolazone (ZAROXOLYN) 5 MG tablet  Take one tablet by mouth 30 minutes before AM Lasix on Monday. Take extra potassium tab on metolazone day.  15 tablet  3   .  Multiple Vitamin (MULTIVITAMIN WITH MINERALS) TABS tablet  Take 1 tablet by mouth daily.     .  omeprazole (PRILOSEC) 20 MG capsule  Take 20 mg by mouth daily.     .  Oxycodone HCl 10 MG TABS  Take 10 mg by mouth every 8 (eight) hours as needed (for pain).     .  Potassium Chloride ER 20 MEQ TBCR  Take 20-40 mEq by mouth daily. 1 TABLET DAILY, EXCEPT ON MONDAYS TAKE 2     .  senna (SENOKOT) 8.6 MG TABS tablet  Take 1 tablet by mouth 2 (two) times daily as needed for mild constipation.     .  glucose blood (BAYER CONTOUR TEST) test strip  1 each by Other route   as needed. USE TO TEST BLOOD SUGAR TWICE DAILY      Home:  Home Living  Family/patient expects to be discharged to:: Private residence  Living Arrangements: Children;Other relatives  Available Help at Discharge: Family  Type of Home: House  Home Access: Stairs to enter  Entrance Stairs-Number of Steps: 3  Entrance Stairs-Rails: Can reach both  Home Layout: One level   Home Equipment: Walker - 2 wheels;Grab bars - toilet;Grab bars - tub/shower  Functional History:  Prior Function  Level of Independence: Independent with assistive device(s)  Functional Status:  Mobility:  Bed Mobility  Overal bed mobility: Needs Assistance  Bed Mobility: Rolling;Supine to Sit  Rolling: Min assist  Supine to sit: Min assist  General bed mobility comments: did not do bed mobility, pt stayed in the chair  Transfers  Overall transfer level: Needs assistance  Equipment used: Rolling walker (2 wheeled)  Transfers: Sit to/from Stand  Sit to Stand: Mod assist;+2 safety/equipment  Stand pivot transfers: Mod assist  General transfer comment: cues for hand placement and to get pt positioned for symmetrical stand.  Ambulation/Gait  Ambulation/Gait assistance: Max assist  Ambulation Distance (Feet): 7 Feet (then additional 7 feet with RW and extra assist)  Assistive device: Rolling walker (2 wheeled)  Gait Pattern/deviations: Step-through pattern;Ataxic;Narrow base of support (with heavy lean L)  General Gait Details: With significant w/shift assist, pt able to amb about 7 feet before getting unsafe for pt and therapist. pt needs supporting in order to advance L foot then has trouble placing appropriately   ADL:  ADL  Overall ADL's : Needs assistance/impaired  Grooming: Minimal assistance;Sitting  Upper Body Bathing: Moderate assistance;Sitting  Lower Body Bathing: Total assistance;+2 for safety/equipment;Sit to/from stand  Upper Body Dressing : Minimal assistance;Sitting  Lower Body Dressing: Total assistance;+2 for safety/equipment;Sit to/from stand  Toilet Transfer: Moderate assistance;Stand-pivot;RW  Toileting- Clothing Manipulation and Hygiene: Total assistance;Sit to/from stand;+2 for safety/equipment  General ADL Comments: Patient limited by poor pulmonary/cardiovascular support and increased anxiety during session, Patient anxious with therapist assisting with bed  mobility and for stand pivot transfer EOB>recliner. Patient would say " you can't do this by yourself" and "make sure you put that belt around me". Patient's daughter present half-way thru eval & treat. Daughter with multiple questions that this therapist deferred to patient's RN. Patient performed stand pivot transfer with mod assist and max verbal cues for safety; patient with an increased posterior lean during transfer. Patient also with complaints of being dizzy and nauseous; RN aware of this.  Cognition:  Cognition  Overall Cognitive Status: Within Functional Limits for tasks assessed  Orientation Level: Oriented X4  Cognition  Arousal/Alertness: Awake/alert  Behavior During Therapy: Anxious  Overall Cognitive Status: Within Functional Limits for tasks assessed  Memory: Decreased short-term memory  Physical Exam:  Blood pressure 145/76, pulse 68, temperature 97.3 F (36.3 C), temperature source Oral, resp. rate 20, height 5' 7" (1.702 m), weight 105.87 kg (233 lb 6.4 oz), SpO2 95.00%.  Physical Exam  Nursing note and vitals reviewed.  Constitutional: He is oriented to person, place, and time. He appears well-developed and well-nourished.  Morbidly obese  HENT:  Head: Normocephalic and atraumatic.  Eyes: Conjunctivae are normal. Pupils are equal, round, and reactive to light.  Neck: Normal range of motion. Neck supple.  Cardiovascular: Normal rate. An irregularly irregular rhythm present.  Respiratory: He has wheezes in the right middle field, the right lower field, the left middle field and the left lower field.  GI:   Soft. Bowel sounds are normal. He exhibits no distension.  Musculoskeletal: He exhibits edema and tenderness. 2+ pedal edema bilaterally. 2+ edema RLE>LLE. No pain with ROM bilateral elbows. No edema or erythema bilateral elbows. Right shoulder with RTC signs and substantial pain in ROM. Grip weak on right due to pain and deformities. Pain with R elbow AROM Neurological:  He is alert and oriented to person, place, and time.  Skin: Skin is warm and dry.  Psychiatric: He has a normal mood and affect. His behavior is anxious especially with mobility. Judgment and thought content normal.  Results for orders placed during the hospital encounter of 07/21/14 (from the past 48 hour(s))   TSH Status: Abnormal    Collection Time    07/22/14 11:16 AM   Result  Value  Ref Range    TSH  5.740 (*)  0.350 - 4.500 uIU/mL   SEDIMENTATION RATE Status: Abnormal    Collection Time    07/22/14 11:30 AM   Result  Value  Ref Range    Sed Rate  48 (*)  0 - 16 mm/hr   URIC ACID Status: Abnormal    Collection Time    07/22/14 11:30 AM   Result  Value  Ref Range    Uric Acid, Serum  11.1 (*)  4.0 - 7.8 mg/dL   RHEUMATOID FACTOR Status: None    Collection Time    07/22/14 11:30 AM   Result  Value  Ref Range    Rheumatoid Factor  <10  <=14 IU/mL    Comment:  (NOTE)     Interpretive Table     Low Positive: 15 - 41 IU/mL     High Positive: >= 42 IU/mL     In addition to the RF result, and clinical symptoms including joint     involvement, the 2010 ACR Classification Criteria for     scoring/diagnosing Rheumatoid Arthritis include the results of the     following tests: CRP (23860), ESR (15010), and CCP (APCA) (82255).     www.rheumatology.org/practice/clinical/classification/ra/ra_2010.asp     Performed at [facility]   ANA Status: None    Collection Time    07/22/14 11:30 AM   Result  Value  Ref Range    ANA  NEGATIVE  NEGATIVE    Comment:  Performed at [facility]   GLUCOSE, CAPILLARY Status: Abnormal    Collection Time    07/22/14 11:44 AM   Result  Value  Ref Range    Glucose-Capillary  159 (*)  70 - 99 mg/dL    Comment 1  Documented in Chart     Comment 2  Notify RN    GLUCOSE, CAPILLARY Status: Abnormal    Collection Time    07/22/14 5:06 PM   Result  Value  Ref Range    Glucose-Capillary  213 (*)  70 - 99 mg/dL    Comment 1  Documented in  Chart     Comment 2  Notify RN    GLUCOSE, CAPILLARY Status: Abnormal    Collection Time    07/22/14 9:00 PM   Result  Value  Ref Range    Glucose-Capillary  203 (*)  70 - 99 mg/dL    Comment 1  Notify RN    GLUCOSE, CAPILLARY Status: Abnormal    Collection Time    07/23/14 7:52 AM   Result  Value  Ref Range    Glucose-Capillary  159 (*)  70 - 99 mg/dL      Comment 1  Documented in Chart     Comment 2  Notify RN    GLUCOSE, CAPILLARY Status: Abnormal    Collection Time    07/23/14 11:55 AM   Result  Value  Ref Range    Glucose-Capillary  167 (*)  70 - 99 mg/dL    Comment 1  Documented in Chart     Comment 2  Notify RN    GLUCOSE, CAPILLARY Status: Abnormal    Collection Time    07/23/14 4:54 PM   Result  Value  Ref Range    Glucose-Capillary  179 (*)  70 - 99 mg/dL    Comment 1  Documented in Chart     Comment 2  Notify RN    GLUCOSE, CAPILLARY Status: Abnormal    Collection Time    07/23/14 9:06 PM   Result  Value  Ref Range    Glucose-Capillary  210 (*)  70 - 99 mg/dL   GLUCOSE, CAPILLARY Status: Abnormal    Collection Time    07/24/14 8:06 AM   Result  Value  Ref Range    Glucose-Capillary  127 (*)  70 - 99 mg/dL    Comment 1  Documented in Chart     Comment 2  Notify RN     No results found.  Medical Problem List and Plan:  1. Functional deficits secondary to Gout flare and severe diabetic peripheral neuropathy.  2. DVT Prophylaxis/Anticoagulation: Pharmaceutical: Lovenox--monitor platelets with history of thrombocytopenia.  3. Pain Management: continue colchicine and prednisone.  4. Mood: NO signs of ETOH withdrawal noted. CIWA protocol. Continue prozac. LCSW to follow for evaluation and support.  5. Neuropsych: This patient is capable of making decisions on his own behalf.  6. Skin/Wound Care: Routine pressure relief measures.  7. Fluids/Electrolytes/Nutrition: Monitor strict I/O. Offer nutritional supplements if po intake poor.  8. Chronic diastolic HF: Check  daily weights. Low salt diet. Continue lasix, coreg and lipitor. Oxygen at bedtime.  9. DM type 2 with polyneuropathy: Monitor BS with ac/hs checks. Continue lantus insulin with SS coverage as anticipate BS variation due to prednisone.  10. Alcohol abuse with gait disorder: Resume lactulose.  11. Metastatic breast cancer: Awaiting surgery.  12. Gout flare: Will taper prednisone as symptoms improving. On allopurinol in addition to colchicine.  13. Dizziness with diplopia: Has history of inner ear problems in the past. Will order vestibular rehab. Reports diplopia with near vision--question accomodation issues v/s neurologic changes. Question repeat CT head as also showing left lean with ambulation.  Post Admission Physician Evaluation:  1. Functional deficits secondary to Gout flare and severe diabetic peripheral neuropathy 2. Patient is admitted to receive collaborative, interdisciplinary care between the physiatrist, rehab nursing staff, and therapy team. 3. Patient's level of medical complexity and substantial therapy needs in context of that medical necessity cannot be provided at a lesser intensity of care such as a SNF. 4. Patient has experienced substantial functional loss from his/her baseline which was documented above under the "Functional History" and "Functional Status" headings. Judging by the patient's diagnosis, physical exam, and functional history, the patient has potential for functional progress which will result in measurable gains while on inpatient rehab. These gains will be of substantial and practical use upon discharge in facilitating mobility and self-care at the household level. 5. Physiatrist will provide 24 hour management of medical needs as well as oversight of the therapy plan/treatment and provide guidance as appropriate regarding the interaction of the two. 6.   24 hour rehab nursing will assist with bladder management, bowel management, safety, skin/wound care, disease  management, medication administration, pain management and patient education and help integrate therapy concepts, techniques,education, etc. 7. PT will assess and treat for/with: pre gait, gait training, endurance , safety, equipment, neuromuscular re education. Goals are: Sup/Mod I. 8. OT will assess and treat for/with: ADLs, Cognitive perceptual skills, Neuromuscular re education, safety, endurance, equipment. Goals are: Sup/Mod I. Therapy may proceed with showering this patient. 9. SLP will assess and treat for/with: NA. Goals are: NA. 10. Case Management and Social Worker will assess and treat for psychological issues and discharge planning. 11. Team conference will be held weekly to assess progress toward goals and to determine barriers to discharge. 12. Patient will receive at least 3 hours of therapy per day at least 5 days per week. 13. ELOS: 12-18days  14. Prognosis: good Alexianna Nachreiner E. Kristin Lamagna M.D. [facility] FAAPM&R (Sports Med, Neuromuscular Med) Diplomate Am Board of Electrodiagnostic Med   07/24/2014  

## 2014-07-24 NOTE — Progress Notes (Signed)
Physical Therapy Treatment Patient Details Name: [name] MRN: [id number] DOB: 1946-09-11 Today's Date: 07/24/2014    History of Present Illness Pt is a 68 y/o male admitted s/p acute on chronic systolic and diastolic heart failure, NYHA class 4.     PT Comments    Improved ability to w/shift and attain midline for improvements in gait stability. Pt continued to need consistent cuing for midline orientation and w/shift assist in order to advance with a more symmetrical BOS.  Follow Up Recommendations  CIR     Equipment Recommendations  None recommended by PT;Other (comment)    Recommendations for Other Services Rehab consult     Precautions / Restrictions      Mobility  Bed Mobility Overal bed mobility: Needs Assistance Bed Mobility: Supine to Sit     Supine to sit: Min assist     General bed mobility comments: assist due to back pain.  Transfers Overall transfer level: Needs assistance Equipment used: Rolling walker (2 wheeled) Transfers: Sit to/from Stand Sit to Stand: Mod assist;+2 safety/equipment         General transfer comment: pt scooted to EOB and positioned legs appropriately without assist today.  Still mod assist to stand due to need to come forward, but generally much less assist.  Ambulation/Gait Ambulation/Gait assistance: Mod assist;Min assist;+2 safety/equipment Ambulation Distance (Feet): 20 Feet (excluding turns ) Assistive device: Rolling walker (2 wheeled) Gait Pattern/deviations: Step-to pattern;Decreased step length - right;Decreased step length - left;Decreased stride length;Ataxic Gait velocity: very slow and deliberate.   General Gait Details: Pt able to follow v/t cues to laboriously execute w/shifts to unweight his feet.  Though uncoordinated, pt able to keep feet advancing forward into an appropriate placement within BOS.  Pt continued to list moderately heavily Left unless cued to correct to midline Right.  Worked on  coordination of w/shifts and forward or backward stepping during turns.   Stairs            Wheelchair Mobility    Modified Rankin (Stroke Patients Only)       Balance Overall balance assessment: Needs assistance Sitting-balance support: No upper extremity supported Sitting balance-Leahy Scale: Fair Sitting balance - Comments: improved scooting and balancing within BOS at EOB   Standing balance support: Bilateral upper extremity supported Standing balance-Leahy Scale: Poor Standing balance comment: Improved ability to find midline with cues and hold for short periods of time.  Pt reports still feels like midline is off.                    Cognition Arousal/Alertness: Awake/alert Behavior During Therapy: WFL for tasks assessed/performed Overall Cognitive Status: Within Functional Limits for tasks assessed                      Exercises      General Comments        Pertinent Vitals/Pain Pain Assessment: Faces Faces Pain Scale: Hurts even more Pain Location: back Pain Descriptors / Indicators: Aching;Constant Pain Intervention(s): Repositioned;Limited activity within patient's tolerance    Home Living                      Prior Function            PT Goals (current goals can now be found in the care plan section) Acute Rehab PT Goals Patient Stated Goal: to go home PT Goal Formulation: With patient Time For Goal Achievement: 08/05/14 Potential to Achieve Goals:  Good Progress towards PT goals: Progressing toward goals    Frequency  Min 4X/week    PT Plan Current plan remains appropriate    Co-evaluation             End of Session Equipment Utilized During Treatment: Oxygen Activity Tolerance: Patient limited by fatigue;Patient tolerated treatment well Patient left: in chair;with call bell/phone within reach     Time: 1236-1259 PT Time Calculation (min): 23 min  Charges:  $Gait Training: 8-22 mins $Therapeutic  Activity: 8-22 mins                    G Codes:      Aaliyah Cancro, Tessie Fass 07/24/2014, 1:18 PM 07/24/2014  Donnella Sham, PT [phone]  (pager)

## 2014-07-24 NOTE — Discharge Instructions (Signed)
Discharge to CIR. ° ° °

## 2014-07-24 NOTE — Progress Notes (Signed)
Patient arrived from [facility] with RN and patient belongings. Patient oriented to patient room, rehab safety plan, fall prevention plan, rehab process, rehab schedule, and call bell system. Patient resting comfortably in recliner with no complaints of pain at this time.

## 2014-07-24 NOTE — Discharge Summary (Signed)
Family Medicine Teaching Service  Discharge Note : Attending Jeff Nydia Ytuarte MD Pager [phone] Inpatient Team Pager:  [phone]  I have reviewed this patient and the patient's chart and have discussed discharge planning with the resident at the time of discharge. I agree with the discharge plan as above.    

## 2014-07-25 ENCOUNTER — Inpatient Hospital Stay (HOSPITAL_COMMUNITY): Payer: Medicare Other | Admitting: Physical Therapy

## 2014-07-25 ENCOUNTER — Inpatient Hospital Stay (HOSPITAL_COMMUNITY): Payer: Medicare Other | Admitting: Occupational Therapy

## 2014-07-25 DIAGNOSIS — R270 Ataxia, unspecified: Secondary | ICD-10-CM | POA: Diagnosis not present

## 2014-07-25 DIAGNOSIS — E1022 Type 1 diabetes mellitus with diabetic chronic kidney disease: Secondary | ICD-10-CM

## 2014-07-25 DIAGNOSIS — R5381 Other malaise: Secondary | ICD-10-CM

## 2014-07-25 DIAGNOSIS — N185 Chronic kidney disease, stage 5: Secondary | ICD-10-CM

## 2014-07-25 DIAGNOSIS — E1142 Type 2 diabetes mellitus with diabetic polyneuropathy: Secondary | ICD-10-CM | POA: Diagnosis present

## 2014-07-25 LAB — COMPREHENSIVE METABOLIC PANEL
ALBUMIN: 3.1 g/dL — AB (ref 3.5–5.2)
ALT: 10 U/L (ref 0–53)
ANION GAP: 14 (ref 5–15)
AST: 17 U/L (ref 0–37)
Alkaline Phosphatase: 59 U/L (ref 39–117)
BILIRUBIN TOTAL: 0.6 mg/dL (ref 0.3–1.2)
BUN: 50 mg/dL — ABNORMAL HIGH (ref 6–23)
CALCIUM: 9.2 mg/dL (ref 8.4–10.5)
CO2: 32 meq/L (ref 19–32)
CREATININE: 0.94 mg/dL (ref 0.50–1.35)
Chloride: 96 mEq/L (ref 96–112)
GFR calc Af Amer: >90 mL/min (ref >90)
GFR calc non Af Amer: 83 mL/min — ABNORMAL LOW (ref >90)
Glucose, Bld: 111 mg/dL — ABNORMAL HIGH (ref 70–99)
Potassium: 3.6 mEq/L — ABNORMAL LOW (ref 3.7–5.3)
Sodium: 142 mEq/L (ref 137–147)
Total Protein: 7.3 g/dL (ref 6.0–8.3)

## 2014-07-25 LAB — CBC WITH DIFFERENTIAL/PLATELET
BASOS PCT: 0 % (ref 0–1)
Basophils Absolute: 0 10*3/uL (ref 0.0–0.1)
EOS PCT: 1 % (ref 0–5)
Eosinophils Absolute: 0.1 10*3/uL (ref 0.0–0.7)
HEMATOCRIT: 39.7 % (ref 39.0–52.0)
HEMOGLOBIN: 12.9 g/dL — AB (ref 13.0–17.0)
Lymphocytes Relative: 30 % (ref 12–46)
Lymphs Abs: 2.1 10*3/uL (ref 0.7–4.0)
MCH: 31 pg (ref 26.0–34.0)
MCHC: 32.5 g/dL (ref 30.0–36.0)
MCV: 95.4 fL (ref 78.0–100.0)
MONO ABS: 0.7 10*3/uL (ref 0.1–1.0)
Monocytes Relative: 10 % (ref 3–12)
Neutro Abs: 4.1 10*3/uL (ref 1.7–7.7)
Neutrophils Relative %: 59 % (ref 43–77)
Platelets: 148 10*3/uL — ABNORMAL LOW (ref 150–400)
RBC: 4.16 MIL/uL — ABNORMAL LOW (ref 4.22–5.81)
RDW: 15.7 % — AB (ref 11.5–15.5)
WBC: 7 10*3/uL (ref 4.0–10.5)

## 2014-07-25 LAB — GLUCOSE, CAPILLARY
GLUCOSE-CAPILLARY: 108 mg/dL — AB (ref 70–99)
GLUCOSE-CAPILLARY: 200 mg/dL — AB (ref 70–99)
GLUCOSE-CAPILLARY: 254 mg/dL — AB (ref 70–99)
Glucose-Capillary: 99 mg/dL (ref 70–99)

## 2014-07-25 LAB — AMMONIA: AMMONIA: 48 umol/L (ref 11–60)

## 2014-07-25 MED ORDER — GABAPENTIN 300 MG PO CAPS
300.0000 mg | ORAL_CAPSULE | Freq: Two times a day (BID) | ORAL | Status: DC
  Administered 2014-07-25 – 2014-08-02 (×16): 300 mg via ORAL
  Filled 2014-07-25 (×18): qty 1

## 2014-07-25 NOTE — Progress Notes (Signed)
PMR Admission Coordinator Pre-Admission Assessment  Patient: [name] is an 68 y.o., male  MRN: [id number]  DOB: 08/20/1946  Height: 5\' 7"  (170.2 cm)  Weight: 105.87 kg (233 lb 6.4 oz)  Insurance Information  HMO: No PPO: PCP: IPA: 80/20: OTHER:  PRIMARY: Medicare A/B Policy#: [id number] Subscriber: [name]  CM Name: Phone#: Fax#:  Pre-Cert#: Employer: Retired  Benefits: Phone #: Name: Checked in [place]. Date: 11/11/08 Deduct: $1260 Out of Pocket Max: none Life Max: unlimited  CIR: 100% SNF: 100 days  Outpatient: 80% Co-Pay: 20%  Home Health: 100% Co-Pay: none  DME: 80% Co-Pay: 20%  Providers: patient's choice   SECONDARY: [employer] Policy#: [id number] Subscriber: Plan F  CM Name: Phone#: Fax#:  Pre-Cert#: Employer: Retired  Benefits: Phone #: [phone] Name:  Eff. Date: Deduct: Out of Pocket Max: Life Max:  CIR: SNF:  Outpatient: Co-Pay:  Home Health: Co-Pay:  DME: Co-Pay:  Emergency Contact Information  Contact Information    Name  Relation  Home  Work  Mobile    Hardy,[name]  Daughter    [phone]      Current Medical History  Patient Admitting Diagnosis: Polyarticular gout, ETOH abuse  History of Present Illness: A 68 y.o. male with history of OSA, HTN, CAF, ongoing alcohol abuse, chronic pain; who was admitted on 07/21/14 with fatigue, increased weakness and BLE edema. He was treated with IV lasix due to complaints of SOB and Dr. Haroldine Laws consulted for input. CT head with moderate generalized atrophy with progression since 2011. He felt that volume status was stable but patient with polyarticular gout flare in setting of ETOH abuse and added steroids for treatment. Uric acid 11.1 with TSH 5.74. X rays lumbar spine with extensive spondylosis and osteoarthritic change with diffuse idiopathic skeletal hyperostosis. Therapy evaluation done and patient with significant balance deficits with ataxia as well as difficulty with mobility due to gout flare.  CIR recommended by MD and therapy team.  Past Medical History  Past Medical History   Diagnosis  Date   .  CHRONIC OBSTRUCTIVE PULMONARY DISEASE  06/20/2009   .  OBSTRUCTIVE SLEEP APNEA  06/20/2009   .  CAROTID STENOSIS  06/20/2009     A. 08/2001 s/p L CEA; B. 09/14/11 - Carotid U/S - 40-59% bilateral stenosis, left CEA patch angioplasty is patent   .  DM  06/20/2009   .  CAD  06/20/2009     A. 08/2000 - s/p CABG x 4 - LIMA-LAD, Left Radial-OM, VG-DIAG, VG-RCA; B. Neg. MV 2010   .  HYPERLIPIDEMIA  06/20/2009   .  HYPERTENSION  06/20/2009   .  Hypothyroidism    .  Low back pain    .  Asthma      as child   .  Pneumonia    .  Atrial fibrillation      Not felt to be coumadin candidate 2/2 ETOH use.   Marland Kitchen  ETOH abuse    .  History of tobacco abuse      remote - quit 1970   .  Bilateral renal cysts    .  Marijuana abuse    .  Morbidly obese    .  CHF (congestive heart failure)    .  Falls frequently    .  Hx of cardiovascular stress test  05/2014     Lexiscan Myoview (8/15): No ischemia; EF 63% - Normal Study   .  Breast cancer, right breast    .  Gout     Family History  family history includes Cancer in his mother; Cancer (age of onset: 33) in his brother; Stroke in his father and mother. There is no history of Heart attack.  Prior Rehab/Hospitalizations: Has been to [facility] X [facility] previously and was there recently. Had [facility] therapies after discharge from SNF.  Current Medications  Current facility-administered medications:0.9 % sodium chloride infusion, 250 mL, Intravenous, PRN, Cordelia Poche, MD; acetaminophen (TYLENOL) suppository 650 mg, 650 mg, Rectal, Q6H PRN, Cordelia Poche, MD; acetaminophen (TYLENOL) tablet 650 mg, 650 mg, Oral, Q6H PRN, Cordelia Poche, MD, 650 mg at 07/23/14 1718; allopurinol (ZYLOPRIM) tablet 300 mg, 300 mg, Oral, Daily, Jolaine Artist, MD, 300 mg at 07/24/14 1109  aspirin chewable tablet 81 mg, 81 mg, Oral, Daily, Cordelia Poche, MD, 81 mg at 07/24/14 1109;  atorvastatin (LIPITOR) tablet 20 mg, 20 mg, Oral, Daily, Cordelia Poche, MD, 20 mg at 07/24/14 1108; carvedilol (COREG) tablet 3.125 mg, 3.125 mg, Oral, BID WC, Cordelia Poche, MD, 3.125 mg at 07/24/14 0830; colchicine tablet 0.6 mg, 0.6 mg, Oral, BID, Jolaine Artist, MD, 0.6 mg at 07/24/14 1109  FLUoxetine (PROZAC) capsule 20 mg, 20 mg, Oral, Daily, Cordelia Poche, MD, 20 mg at 07/24/14 1109; fluticasone (FLOVENT HFA) 110 MCG/ACT inhaler 1 puff, 1 puff, Inhalation, BID, Lind Covert, MD, 1 puff at 78/93/81 0175; folic acid (FOLVITE) tablet 1 mg, 1 mg, Oral, Daily, Cordelia Poche, MD, 1 mg at 07/24/14 1109; furosemide (LASIX) tablet 160 mg, 160 mg, Oral, BID, Rande Brunt, NP, 160 mg at 07/24/14 0830  gabapentin (NEURONTIN) capsule 300 mg, 300 mg, Oral, Daily, Cordelia Poche, MD, 300 mg at 07/24/14 1109; heparin injection 5,000 Units, 5,000 Units, Subcutaneous, 3 times per day, Cordelia Poche, MD, 5,000 Units at 07/24/14 1351; insulin aspart (novoLOG) injection 0-9 Units, 0-9 Units, Subcutaneous, TID WC, Cordelia Poche, MD, 1 Units at 07/24/14 1241  insulin aspart (novoLOG) injection 3 Units, 3 Units, Subcutaneous, TID WC, Katheren Shams, DO, 3 Units at 07/24/14 1241; insulin glargine (LANTUS) injection 8 Units, 8 Units, Subcutaneous, QHS, Cordelia Poche, MD, 8 Units at 07/23/14 2144; levothyroxine (SYNTHROID, LEVOTHROID) tablet 187 mcg, 187 mcg, Oral, QAC breakfast, Katheren Shams, DO, 187 mcg at 07/24/14 1108; LORazepam (ATIVAN) injection 1 mg, 1 mg, Intravenous, Q6H PRN, Cordelia Poche, MD  LORazepam (ATIVAN) tablet 0-4 mg, 0-4 mg, Oral, Q12H, Cordelia Poche, MD; LORazepam (ATIVAN) tablet 1 mg, 1 mg, Oral, Q6H PRN, Cordelia Poche, MD, 1 mg at 07/22/14 2314; multivitamin with minerals tablet 1 tablet, 1 tablet, Oral, Daily, Cordelia Poche, MD, 1 tablet at 07/24/14 1109; oxyCODONE (Oxy IR/ROXICODONE) immediate release tablet 15 mg, 15 mg, Oral, Q8H PRN, Alveda Reasons, MD, 15 mg at 07/24/14 1240  pantoprazole  (PROTONIX) EC tablet 40 mg, 40 mg, Oral, Daily, Cordelia Poche, MD, 40 mg at 07/24/14 1109; potassium chloride SA (K-DUR,KLOR-CON) CR tablet 20 mEq, 20 mEq, Oral, Daily, Cordelia Poche, MD, 20 mEq at 07/24/14 1109; senna (SENOKOT) tablet 8.6 mg, 1 tablet, Oral, BID PRN, Cordelia Poche, MD, 8.6 mg at 07/23/14 1014; sodium chloride 0.9 % injection 3 mL, 3 mL, Intravenous, Q12H, Cordelia Poche, MD, 3 mL at 07/24/14 1108  sodium chloride 0.9 % injection 3 mL, 3 mL, Intravenous, Q12H, Cordelia Poche, MD, 3 mL at 07/24/14 0831; sodium chloride 0.9 % injection 3 mL, 3 mL, Intravenous, PRN, Cordelia Poche, MD, 3 mL at 07/22/14 1100; thiamine (VITAMIN B-1) tablet 100 mg, 100 mg, Oral, Daily, Cordelia Poche, MD, 100  mg at 07/24/14 1109  Patients Current Diet: Heart Healthy, carb mod, thin liquids  Precautions / Restrictions  Precautions  Precautions: None  Restrictions  Weight Bearing Restrictions: No  Prior Activity Level  Household: Homebound for the past 3 weeks. Went out 3 X a week up until 3 weeks ago and was driving himself.  Home Assistive Devices / Equipment  Home Assistive Devices/Equipment: Environmental consultant (specify type)  Home Equipment: Walker - 2 wheels;Grab bars - toilet;Grab bars - tub/shower  Prior Functional Level  Prior Function  Level of Independence: Independent with assistive device(s)  Current Functional Level  Cognition  Overall Cognitive Status: Within Functional Limits for tasks assessed  Orientation Level: Oriented X4   Extremity Assessment  (includes Sensation/Coordination)  Upper Extremity Assessment: Defer to OT evaluation  Lower Extremity Assessment: RLE deficits/detail;LLE deficits/detail  RLE Deficits / Details: grossly >4/5 except trace df  LLE Deficits / Details: grossly >4/5  Cervical / Trunk Assessment: Normal   ADLs  Overall ADL's : Needs assistance/impaired  Grooming: Minimal assistance;Sitting  Upper Body Bathing: Moderate assistance;Sitting  Lower Body Bathing: Total assistance;+2  for safety/equipment;Sit to/from stand  Upper Body Dressing : Minimal assistance;Sitting  Lower Body Dressing: Total assistance;+2 for safety/equipment;Sit to/from stand  Toilet Transfer: Moderate assistance;Stand-pivot;RW  Toileting- Clothing Manipulation and Hygiene: Total assistance;Sit to/from stand;+2 for safety/equipment  General ADL Comments: Patient limited by poor pulmonary/cardiovascular support and increased anxiety during session, Patient anxious with therapist assisting with bed mobility and for stand pivot transfer EOB>recliner. Patient would say " you can't do this by yourself" and "make sure you put that belt around me". Patient's daughter present half-way thru eval & treat. Daughter with multiple questions that this therapist deferred to patient's RN. Patient performed stand pivot transfer with mod assist and max verbal cues for safety; patient with an increased posterior lean during transfer. Patient also with complaints of being dizzy and nauseous; RN aware of this.   Mobility  Overal bed mobility: Needs Assistance  Bed Mobility: Supine to Sit  Rolling: Min assist  Supine to sit: Min assist  General bed mobility comments: assist due to back pain.   Transfers  Overall transfer level: Needs assistance  Equipment used: Rolling walker (2 wheeled)  Transfers: Sit to/from Stand  Sit to Stand: Mod assist;+2 safety/equipment  Stand pivot transfers: Mod assist  General transfer comment: pt scooted to EOB and positioned legs appropriately without assist today. Still mod assist to stand due to need to come forward, but generally much less assist.   Ambulation / Gait / Stairs / Wheelchair Mobility  Ambulation/Gait  Ambulation/Gait assistance: Mod assist;Min assist;+2 safety/equipment  Ambulation Distance (Feet): 20 Feet (excluding turns )  Assistive device: Rolling walker (2 wheeled)  Gait Pattern/deviations: Step-to pattern;Decreased step length - right;Decreased step length -  left;Decreased stride length;Ataxic  Gait velocity: very slow and deliberate.  General Gait Details: Pt able to follow v/t cues to laboriously execute w/shifts to unweight his feet. Though uncoordinated, pt able to keep feet advancing forward into an appropriate placement within BOS. Pt continued to list moderately heavily Left unless cued to correct to midline Right. Worked on coordination of w/shifts and forward or backward stepping during turns.   Posture / Balance  Dynamic Sitting Balance  Sitting balance - Comments: improved scooting and balancing within BOS at EOB   Special needs/care consideration  BiPAP/CPAP No  CPM No  Continuous Drip IV No  Dialysis No  Life VestbNo  Oxygen Yes, uses O2 at home 2L [place]  Special Bed No  Trach Size No  Wound Vac (area) No  Skin Has bilateral edema and swelling both lower extremities. Also has an abrasion on his left knee.  Bowel mgmt: No BM since admission, takes lactulose at home  Bladder mgmt: Difficulty starting stream  Diabetic mgmt Yes, on insulin at home   Previous Home Environment  Living Arrangements: Children;Other relatives  Available Help at Discharge: Family  Type of Home: House  Home Layout: One level  Home Access: Stairs to enter  Entrance Stairs-Rails: Can reach both  Entrance Stairs-Number of Steps: 3  Bathroom Shower/Tub: Door;Walk-in Designer, television/film set: Handicapped height  Bathroom Accessibility: Yes  How Accessible: Accessible via walker  [place]: No  Discharge Living Setting  Plans for Discharge Living Setting: Patient's home;House;Lives with (comment) (Dtr and son-in-law live with patient.)  Type of Home at Discharge: House  Discharge Home Layout: One level  Discharge Home Access: Stairs to enter  Entrance Stairs-Number of Steps: 3  Does the patient have any problems obtaining your medications?: No  Social/Family/Support Systems  Patient Roles: Parent (Has a daughter and son-in-law.)  Contact  Information: Darrold Junker - daughter  Anticipated Caregiver: daughter and son-in-law  Anticipated Caregiver's Contact Information: [name] [phone]  Ability/Limitations of Caregiver: Dtr provided assistance with some ADLs PTA  Caregiver Availability: Intermittent  Discharge Plan Discussed with Primary Caregiver: Yes  Is Caregiver In Agreement with Plan?: Yes  Does Caregiver/Family have Issues with Lodging/Transportation while Pt is in Rehab?: No  Goals/Additional Needs  Patient/Family Goal for Rehab: PT/OT supervision goals  Expected length of stay: 8-10 days  Cultural Considerations: None  Dietary Needs: Heart healthy, carb mod, thin liquids  Equipment Needs: TBD  Pt/Family Agrees to Admission and willing to participate: Yes  Program Orientation Provided & Reviewed with Pt/Caregiver Including Roles & Responsibilities: Yes  Decrease burden of Care through IP rehab admission: N/A  Possible need for SNF placement upon discharge: Not planned  Patient Condition: This patient's condition remains as documented in the consult dated 07/23/14, in which the Rehabilitation Physician determined and documented that the patient's condition is appropriate for intensive rehabilitative care in an inpatient rehabilitation facility. Will admit to inpatient rehab today.  Preadmission Screen Completed By: Retta Diones, 07/24/2014 2:30 PM  ______________________________________________________________________  Discussed status with Dr. Letta Pate on 07/24/14 at 1430 and received telephone approval for admission today.  Admission Coordinator: Retta Diones, time1430/Date10/14/13  Cosigned by: Charlett Blake, MD [07/24/2014 2:36 PM]   Revision History.Marland KitchenMarland Kitchen

## 2014-07-25 NOTE — Progress Notes (Signed)
Physical Medicine and Rehabilitation Consult  Reason for Consult: Gout flare with weakness and pain  Referring Physician: Dr. Erin Hearing  HPI: [name] is a 68 y.o. male with history of OSA, HTN, CAF, ongoing alcohol abuse, chronic pain; who was admitted on 07/21/14 with fatigue, increased weakness and BLE edema. He was treated with IV lasix due to complaints of SOB and Dr. Haroldine Laws consulted for input. He felt that volume status was stable but patient with polyarticular gout flare in setting of ETOH abuse and added steroids for treatment. Uric acid 11.1 with TSH 5.74. X rays lumbar spine with extensive spondylosis and osteoarthritic change with diffuse idiopathic skeletal hyperostosis. PT/OT evaluations done yesterday and CIR recommended by rehab team and MD.  Review of Systems  HENT: Negative for tinnitus.  Eyes: Negative for blurred vision and double vision.  Respiratory: Negative for cough and shortness of breath.  Breathing "not right yet"  Cardiovascular: Positive for leg swelling. Negative for chest pain and palpitations.  Musculoskeletal: Positive for back pain, joint pain, myalgias and neck pain.  Neurological: Positive for dizziness and weakness. Negative for headaches.   Past Medical History   Diagnosis  Date   .  CHRONIC OBSTRUCTIVE PULMONARY DISEASE  06/20/2009   .  OBSTRUCTIVE SLEEP APNEA  06/20/2009   .  CAROTID STENOSIS  06/20/2009     A. 08/2001 s/p L CEA; B. 09/14/11 - Carotid U/S - 40-59% bilateral stenosis, left CEA patch angioplasty is patent   .  DM  06/20/2009   .  CAD  06/20/2009     A. 08/2000 - s/p CABG x 4 - LIMA-LAD, Left Radial-OM, VG-DIAG, VG-RCA; B. Neg. MV 2010   .  HYPERLIPIDEMIA  06/20/2009   .  HYPERTENSION  06/20/2009   .  Hypothyroidism    .  Low back pain    .  Asthma      as child   .  Pneumonia    .  Atrial fibrillation      Not felt to be coumadin candidate 2/2 ETOH use.   Marland Kitchen  ETOH abuse    .  History of tobacco abuse      remote - quit 1970    .  Bilateral renal cysts    .  Marijuana abuse    .  Morbidly obese    .  CHF (congestive heart failure)    .  Falls frequently    .  Hx of cardiovascular stress test  05/2014     Lexiscan Myoview (8/15): No ischemia; EF 63% - Normal Study   .  Breast cancer, right breast     Past Surgical History   Procedure  Laterality  Date   .  Carotid endarterectomy   2002     left   .  Coronary artery bypass graft       x 4 - 2001   .  Umbilical hernia repair  N/A  01/28/2014     Procedure: HERNIA REPAIR UMBILICAL ADULT/INC; Surgeon: Harl Bowie, MD; Location: [place]; Service: General; Laterality: N/A;   .  Laparotomy  N/A  01/28/2014     Procedure: EXPLORATORY LAPAROTOMY; Surgeon: Harl Bowie, MD; Location: [place]; Service: General; Laterality: N/A;   .  Bowel resection  N/A  01/28/2014     Procedure: SMALL BOWEL RESECTION; Surgeon: Harl Bowie, MD; Location: [place]; Service: General; Laterality: N/A;   .  Hernia repair      Family History  Problem  Relation  Age of Onset   .  Stroke  Mother      ?   Marland Kitchen  Cancer  Mother      unknown type of cancer   .  Stroke  Father      ?   Marland Kitchen  Heart attack  Neg Hx    .  Cancer  Brother  42     stomach cancer    Social History: Lives with family--daughter at home. Independent with walker PTA. He reports that he quit smoking about 45 years ago. He does not have any smokeless tobacco history on file. He reports that he drinks a 1/5 of liquor daily. He reports that he uses illicit drugs (Marijuana).  Allergies   Allergen  Reactions   .  Erythromycin  Hives    Medications Prior to Admission   Medication  Sig  Dispense  Refill   .  aspirin 81 MG chewable tablet  Chew 1 tablet (81 mg total) by mouth daily.  30 tablet  1   .  atorvastatin (LIPITOR) 20 MG tablet  Take 20 mg by mouth daily.     .  carvedilol (COREG) 3.125 MG tablet  Take 1 tablet (3.125 mg total) by mouth 2 (two) times daily with a meal.  60 tablet  5   .  colchicine 0.6 MG  tablet  Take 0.6 mg by mouth daily as needed (for gout).     .  Dimethicone (MOISTURE BARRIER EX)  Apply 1 application topically daily as needed (Gout under stomach). Apply after showers     .  FLUoxetine (PROZAC) 20 MG capsule  Take 20 mg by mouth daily.     .  Fluticasone Furoate 200 MCG/ACT AEPB  Inhale 1 puff into the lungs 2 (two) times daily.     .  furosemide (LASIX) 80 MG tablet  Take 2 tablets (160 mg total) by mouth 2 (two) times daily.  120 tablet  5   .  gabapentin (NEURONTIN) 300 MG capsule  Take 1 capsule (300 mg total) by mouth daily.     .  Insulin Glargine (LANTUS SOLOSTAR) 100 UNIT/ML Solostar Pen  Inject 16 Units into the skin at bedtime.     Marland Kitchen  lactulose (CHRONULAC) 10 GM/15ML solution  Take 20 g by mouth 2 (two) times daily.     Marland Kitchen  levothyroxine (SYNTHROID, LEVOTHROID) 150 MCG tablet  Take 150 mcg by mouth at bedtime.     .  metolazone (ZAROXOLYN) 5 MG tablet  Take one tablet by mouth 30 minutes before AM Lasix on Monday. Take extra potassium tab on metolazone day.  15 tablet  3   .  Multiple Vitamin (MULTIVITAMIN WITH MINERALS) TABS tablet  Take 1 tablet by mouth daily.     Marland Kitchen  omeprazole (PRILOSEC) 20 MG capsule  Take 20 mg by mouth daily.     .  Oxycodone HCl 10 MG TABS  Take 10 mg by mouth every 8 (eight) hours as needed (for pain).     .  Potassium Chloride ER 20 MEQ TBCR  Take 20-40 mEq by mouth daily. 1 TABLET DAILY, EXCEPT ON MONDAYS TAKE 2     .  senna (SENOKOT) 8.6 MG TABS tablet  Take 1 tablet by mouth 2 (two) times daily as needed for mild constipation.     Marland Kitchen  glucose blood (BAYER CONTOUR TEST) test strip  1 each by Other route as needed. USE TO TEST BLOOD  SUGAR TWICE DAILY      Home:  Home Living  Family/patient expects to be discharged to:: Private residence  Living Arrangements: Children (daughter and son-in-law)  Available Help at Discharge: Family  Type of Home: House  Home Access: Stairs to enter  Technical brewer of Steps: 3  Entrance  Stairs-Rails: Can reach both  Home Layout: One level  [place]: Walker - 2 wheels;Grab bars - toilet;Grab bars - tub/shower  Functional History:  Prior Function  Level of Independence: Independent with assistive device(s)  Functional Status:  Mobility:  Bed Mobility  Overal bed mobility: Needs Assistance  Bed Mobility: Rolling;Supine to Sit  Rolling: Min assist  Supine to sit: Min assist  General bed mobility comments: did not do bed mobility, pt stayed in the chair  Transfers  Overall transfer level: Needs assistance  Equipment used: Rolling walker (2 wheeled)  Transfers: Sit to/from Stand  Sit to Stand: Mod assist  Stand pivot transfers: Mod assist  General transfer comment: cues for hand placement and to get pt positioned for symmetrical stand.  Ambulation/Gait  General Gait Details: see pre-gait comments   ADL:  ADL  Overall ADL's : Needs assistance/impaired  Grooming: Minimal assistance;Sitting  Upper Body Bathing: Moderate assistance;Sitting  Lower Body Bathing: Total assistance;+2 for safety/equipment;Sit to/from stand  Upper Body Dressing : Minimal assistance;Sitting  Lower Body Dressing: Total assistance;+2 for safety/equipment;Sit to/from stand  Toilet Transfer: Moderate assistance;Stand-pivot;RW  Toileting- Clothing Manipulation and Hygiene: Total assistance;Sit to/from stand;+2 for safety/equipment  General ADL Comments: Patient limited by poor pulmonary/cardiovascular support and increased anxiety during session, Patient anxious with therapist assisting with bed mobility and for stand pivot transfer EOB>recliner. Patient would say " you can't do this by yourself" and "make sure you put that belt around me". Patient's daughter present half-way thru eval & treat. Daughter with multiple questions that this therapist deferred to patient's RN. Patient performed stand pivot transfer with mod assist and max verbal cues for safety; patient with an increased posterior lean  during transfer. Patient also with complaints of being dizzy and nauseous; RN aware of this.  Cognition:  Cognition  Overall Cognitive Status: Within Functional Limits for tasks assessed  Orientation Level: Oriented X4  Cognition  Arousal/Alertness: Awake/alert  Behavior During Therapy: Anxious  Overall Cognitive Status: Within Functional Limits for tasks assessed  Memory: Decreased short-term memory  Blood pressure 147/88, pulse 70, temperature 97.8 F (36.6 C), temperature source Oral, resp. rate 18, height 5\' 7"  (1.702 m), weight 105.87 kg (233 lb 6.4 oz), SpO2 98.00%.  Physical Exam  Nursing note and vitals reviewed.  Constitutional: He is oriented to person, place, and time. He appears well-developed and well-nourished.  Morbidly obese  HENT:  Head: Normocephalic and atraumatic.  Eyes: Conjunctivae are normal. Pupils are equal, round, and reactive to light.  Neck: Normal range of motion. Neck supple.  Cardiovascular: Normal rate. An irregularly irregular rhythm present.  Respiratory: He has wheezes in the right middle field, the right lower field, the left middle field and the left lower field.  GI: Soft. Bowel sounds are normal. He exhibits no distension.  Musculoskeletal: He exhibits edema and tenderness.  2+ edema RLE>LLE. No pain with ROM bilateral elbows. No edema or erythema bilateral elbows. Right shoulder with RTC signs and substantial pain in ROM. Grip weak on right due to pain and deformities.  Neurological: He is alert and oriented to person, place, and time.  Skin: Skin is warm and dry.  Psychiatric: He has a normal  mood and affect. His behavior is normal. Judgment and thought content normal.   Results for orders placed during the hospital encounter of 07/21/14 (from the past 24 hour(s))   TSH Status: Abnormal    Collection Time    07/22/14 11:16 AM   Result  Value  Ref Range    TSH  5.740 (*)  0.350 - 4.500 uIU/mL   SEDIMENTATION RATE Status: Abnormal     Collection Time    07/22/14 11:30 AM   Result  Value  Ref Range    Sed Rate  48 (*)  0 - 16 mm/hr   URIC ACID Status: Abnormal    Collection Time    07/22/14 11:30 AM   Result  Value  Ref Range    Uric Acid, Serum  11.1 (*)  4.0 - 7.8 mg/dL   RHEUMATOID FACTOR Status: None    Collection Time    07/22/14 11:30 AM   Result  Value  Ref Range    Rheumatoid Factor  <10  <=14 IU/mL   GLUCOSE, CAPILLARY Status: Abnormal    Collection Time    07/22/14 11:44 AM   Result  Value  Ref Range    Glucose-Capillary  159 (*)  70 - 99 mg/dL    Comment 1  Documented in Chart     Comment 2  Notify RN    GLUCOSE, CAPILLARY Status: Abnormal    Collection Time    07/22/14 5:06 PM   Result  Value  Ref Range    Glucose-Capillary  213 (*)  70 - 99 mg/dL    Comment 1  Documented in Chart     Comment 2  Notify RN    GLUCOSE, CAPILLARY Status: Abnormal    Collection Time    07/22/14 9:00 PM   Result  Value  Ref Range    Glucose-Capillary  203 (*)  70 - 99 mg/dL    Comment 1  Notify RN    GLUCOSE, CAPILLARY Status: Abnormal    Collection Time    07/23/14 7:52 AM   Result  Value  Ref Range    Glucose-Capillary  159 (*)  70 - 99 mg/dL    Comment 1  Documented in Chart     Comment 2  Notify RN     Dg Chest 2 View (if Patient Has Fever And/or Copd)  07/21/2014 CLINICAL DATA: Two day history of shortness of breath EXAM: CHEST 2 VIEW COMPARISON: April 20, 2014 FINDINGS: There is no appreciable edema or consolidation. Heart is mildly enlarged with pulmonary vascularity within normal limits. Patient is status post coronary artery bypass grafting. No adenopathy. No bone lesions. IMPRESSION: Mild cardiac enlargement. No edema or consolidation. Electronically Signed By: Lowella Grip M.D. On: 07/21/2014 14:10  Dg Lumbar Spine Complete  07/21/2014 CLINICAL DATA: Pain and generalized weakness EXAM: LUMBAR SPINE - COMPLETE 4+ VIEW COMPARISON: None. FINDINGS: Frontal, lateral, spot lumbosacral lateral, and  bilateral oblique views were obtained. There are 5 non-rib-bearing lumbar type vertebral bodies. There is mild lower thoracic and lumbar levoscoliosis. There is no fracture. There is slight retrolisthesis of L4 on L5, felt to be due to underlying spondylosis. No other spondylolisthesis is present. There is marked disc space narrowing at L4-5 and L5-S1. Vacuum phenomenon is noted at L3-4, L4-5, and L5-S1. There is moderate disc space narrowing at L1-2 and L2-3 is well. There is mild narrowing at L3-4. There are bridging anterior osteophytes consistent with diffuse idiopathic skeletal hyperostosis. There is facet osteoarthritic change  at all levels bilaterally. IMPRESSION: Extensive spondylosis and osteoarthritic change. 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 Slight spondylolisthesis at L4-5 is felt to be due to underlying spondylosis. No acute fracture. Mild scoliosis. Electronically Signed By: Lowella Grip M.D. On: 07/21/2014 14:13  Ct Head Wo Contrast  07/21/2014 CLINICAL DATA: Dizziness. Recent frequent falls. Generalized weakness. Bilateral lower extremity edema. Current history of diabetes and coronary artery disease. Prior carotid endarterectomy in 2002. EXAM: CT HEAD WITHOUT CONTRAST TECHNIQUE: Contiguous axial images were obtained from the base of the skull through the vertex without intravenous contrast. COMPARISON: Multiple prior head CTs dating back to 04/05/2010, most recently 03/05/2014. FINDINGS: Moderate cortical and deep atrophy, progressive since 2011. Mild changes of small vessel disease of the white matter, unchanged. Physiologic calcifications in the basal ganglia, unchanged. No mass lesion. No midline shift. No acute hemorrhage or hematoma. No extra-axial fluid collections. No evidence of acute infarction. No skull fracture or other focal osseous abnormality involving the skull. Small mucous retention cyst or polyp in the right maxillary sinus. Remaining visualized paranasal  sinuses, bilateral mastoid air cells and bilateral middle ear cavities well-aerated. Extensive bilateral carotid siphon and left vertebral artery atherosclerosis. IMPRESSION: 1. No acute intracranial abnormality. 2. Moderate generalized atrophy, progressive since 2011. Stable mild chronic microvascular ischemic changes of the white matter. Electronically Signed By: Evangeline Dakin M.D. On: 07/21/2014 14:23   Assessment/Plan:  Diagnosis: functional deficits due to polyarticular gout flare, in the setting of etoh abuse, poor health hygiene  1. Does the need for close, 24 hr/day medical supervision in concert with the patient's rehab needs make it unreasonable for this patient to be served in a less intensive setting? Yes 2. Co-Morbidities requiring supervision/potential complications: dm, chf, cad, morbid obesity 3. Due to bladder management, bowel management, safety, skin/wound care, disease management, medication administration, pain management and patient education, does the patient require 24 hr/day rehab nursing? Yes 4. Does the patient require coordinated care of a physician, rehab nurse, PT (1-2 hrs/day, 5 days/week) and OT (1-2 hrs/day, 5 days/week) to address physical and functional deficits in the context of the above medical diagnosis(es)? Yes Addressing deficits in the following areas: balance, endurance, locomotion, strength, transferring, bowel/bladder control, bathing, dressing, feeding, grooming and toileting 5. Can the patient actively participate in an intensive therapy program of at least 3 hrs of therapy per day at least 5 days per week? Potentially 6. The potential for patient to make measurable gains while on inpatient rehab is good and fair 7. Anticipated functional outcomes upon discharge from inpatient rehab are supervision with PT, supervision with OT, n/a with SLP. 8. Estimated rehab length of stay to reach the above functional goals is: 8-10 days 9. Does the patient have  adequate social supports to accommodate these discharge functional goals? Potentially 10. Anticipated D/C setting: Home 11. Anticipated post D/C treatments: [place] therapy 12. Overall Rehab/Functional Prognosis: good and fair RECOMMENDATIONS:  This patient's condition is appropriate for continued rehabilitative care in the following setting: ?CIR  Patient has agreed to participate in recommended program. Yes  Note that insurance prior authorization may be required for reimbursement for recommended care.  Comment: Pt is unsure if he can participate in the intensity of our program. He was very sedentary PTA. I am open to bringing him to inpatient rehab and encouraged him that I thought he could participate in our program. He will speak with family. Rehab Admissions Coordinator to follow up.  Thanks,  [name], MD, Mellody Drown  07/23/2014  Revision  History...      Date/Time User Action    07/23/2014 9:27 AM [name], MD Sign    07/23/2014 8:54 AM Bary Leriche, PA-C Share   View Details Report    Routing History.Marland KitchenMarland Kitchen

## 2014-07-25 NOTE — Progress Notes (Signed)
Social Work Assessment and Plan Social Work Assessment and Plan  Patient Details  Name: [name] MRN: [id number] Date of Birth: November 11, 1945  Today's Date: 07/25/2014  Problem List:  Patient Active Problem List   Diagnosis Date Noted  . Debility 07/25/2014  . Diabetic polyneuropathy associated with type 2 diabetes mellitus 07/25/2014  . Gout flare 07/24/2014  . Acute gout 07/22/2014  . CHF exacerbation 07/21/2014  . Pre-operative cardiovascular examination 07/15/2014  . Family history of malignant neoplasm of gastrointestinal tract 06/12/2014  . Male breast cancer 06/12/2014  . Cancer of male breast, right 05/29/2014  . Acute on chronic renal failure 04/29/2014  . 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 Acute on chronic systolic and diastolic heart failure, NYHA class 4 04/20/2014  . Diabetes mellitus 01/28/2014  . Panniculitis 04/14/2013  . Acute on chronic diastolic heart failure 32/95/1884  . Seasonal allergies 12/20/2012  . L1 vertebral fracture 09/07/2012  . Acute renal failure 08/30/2012  . Hypothyroidism 08/30/2012  . ETOH abuse   . Atrial fibrillation   . Chronic diastolic heart failure   . DM (diabetes mellitus) type II uncontrolled, periph vascular disorder 06/20/2009  . Obstructive sleep apnea 06/20/2009  . CAROTID STENOSIS 06/20/2009  . CHRONIC OBSTRUCTIVE PULMONARY DISEASE 06/20/2009  . CAD 06/20/2009  . Hypderlipidemia   . Hypertensive heart disease    Past Medical History:  Past Medical History  Diagnosis Date  . CHRONIC OBSTRUCTIVE PULMONARY DISEASE 06/20/2009  . OBSTRUCTIVE SLEEP APNEA 06/20/2009  . CAROTID STENOSIS 06/20/2009    A. 08/2001 s/p L CEA;  B.   09/14/11 - Carotid U/S - 40-59% bilateral stenosis, left CEA patch angioplasty is patent  . DM 06/20/2009  . CAD 06/20/2009    A.  08/2000 - s/p CABG x 4 - LIMA-LAD, Left Radial-OM, VG-DIAG, VG-RCA;  B. Neg. MV  2010  . HYPERLIPIDEMIA 06/20/2009  . HYPERTENSION  06/20/2009  . Hypothyroidism   . Low back pain   . Asthma     as child  . Pneumonia   . Atrial fibrillation     Not felt to be coumadin candidate 2/2 ETOH use.  Marland Kitchen ETOH abuse   . History of tobacco abuse     remote - quit 1970  . Bilateral renal cysts   . Marijuana abuse   . Morbidly obese   . CHF (congestive heart failure)   . Falls frequently   . Hx of cardiovascular stress test 05/2014    Lexiscan Myoview (8/15):  No ischemia; EF 63% - Normal Study  . Breast cancer, right breast   . Gout    Past Surgical History:  Past Surgical History  Procedure Laterality Date  . Carotid endarterectomy  2002    left  . Coronary artery bypass graft      x 4 - 2001  . Umbilical hernia repair N/A 01/28/2014    Procedure: HERNIA REPAIR UMBILICAL ADULT/INC;  Surgeon: Harl Bowie, MD;  Location: [place];  Service: General;  Laterality: N/A;  . Laparotomy N/A 01/28/2014    Procedure: EXPLORATORY LAPAROTOMY;  Surgeon: Harl Bowie, MD;  Location: [place];  Service: General;  Laterality: N/A;  . Bowel resection N/A 01/28/2014    Procedure: SMALL BOWEL RESECTION;  Surgeon: Harl Bowie, MD;  Location: [place];  Service: General;  Laterality: N/A;  . Hernia repair     Social History:  reports that he quit smoking about 45 years ago. He  has never used smokeless tobacco. He reports that he drinks alcohol. He reports that he uses illicit drugs (Marijuana).  Family / Support Systems Marital Status: Widow/Widower Patient Roles: Parent Children: Racheal Patches  [phone] Other Supports: Son in-law Anticipated Caregiver: Daughter, pt plans to take care of himself Ability/Limitations of Caregiver: Daughter is not employed and can Veterinary surgeon Availability: 24/7 Family Dynamics: Close knit family, only has one daughter but she is involved and supportive.  He has many friends/buddies.  He feels for his age he has good supports.  Social History Preferred language:  English Religion: Quaker Cultural Background: No issues Education: [employer] Educated Read: Yes Write: Yes Employment Status: Retired Freight forwarder Issues: No issues Guardian/Conservator: None-according to MD pt is capable of mkaing his own decisions while here.   Abuse/Neglect Physical Abuse: Denies Verbal Abuse: Denies Sexual Abuse: Denies Exploitation of patient/patient's resources: Denies Self-Neglect: Denies  Emotional Status Pt's affect, behavior adn adjustment status: Pt is motivated and feels better since he first got here with his gout.  He reports he is learning a lot about gout and the causes of it.  He had no idea his drinking could cause this.  He plans to limit his intake when he goes home.  He has always been independent and plans to be when he leaves here. Recent Psychosocial Issues: Other health issues-surgery for breast cancer scheduled 11/2. Pyschiatric History: History of depression takes medicines for and feels they help.  He doesn't feel the need for depression screening since he already takes medicines for.  He will let me know if he needs someone to talk too. Substance Abuse History: ETOH and Marijuana he realizes this needs to stop.  He plans to limit his ETOH intake.  He is not interested in community resources  Patient / Family Perceptions, Expectations & Goals Pt/Family understanding of illness & functional limitations: Pt can explain his condition and is well aware of his need to quit certain habits.  He will do waht he feels he needs to do.  He is one to always take car e of himself and will again once he leaves here.  He doesn't feel he will be here long once he is moving better, he will be reayd to go home. Premorbid pt/family roles/activities: Father, Retiree, Home owner. etc Anticipated changes in roles/activities/participation: resume Pt/family expectations/goals: Pt states: " I will be independent when I leave here I'm not worried about  that."  Daughter states; " If he says he going to do it he will, he is just that stubborn."  [employer]: Other (Comment) (Been to NH in the past) Premorbid Home Care/DME Agencies: Other (Comment) (had HH in the past) Transportation available at discharge: Family Resource referrals recommended: Support group (specify) (Substance Abuse Resources)  Discharge Planning Living Arrangements: Children Support Systems: Children;Friends/neighbors Type of Residence: Private residence Insurance Resources: Education officer, museum (specify) ([employer]) Museum/gallery curator Resources: Social Security Financial Screen Referred: No Living Expenses: Own Money Management: Patient Does the patient have any problems obtaining your medications?: No Home Management: daughter Patient/Family Preliminary Plans: Return home with daughter and son in-law whom live with him.  Daughter is not working and is there if needed.  She will be in later and will come in if needed.  Pt used O2 at night at home, here he is keeping it on all of the time. Social Work Anticipated Follow Up Needs: HH/OP;Support Group  Clinical Impression Pleasant hard headed gentleman who is used to doing what  he wants to do.  He is feeling better and feels he will not be here long, once he is moving better.  His daughter is available to assist and Is willing.  Pt aware of his lifestyle habits needing to change and he is willing to do this or adjust them some.  He wants to get stronger for his surgery coming up.  Will provide support and work on a safe discharge plan.  Elease Hashimoto 07/25/2014, 1:57 PM

## 2014-07-25 NOTE — Progress Notes (Signed)
Occupational Therapy Assessment and Plan  Patient Details  Name: [name] MRN: [id number] Date of Birth: 1945/10/15  OT Diagnosis: abnormal posture and muscle weakness (generalized) Rehab Potential: Rehab Potential: Fair (for stated goals) ELOS: 14- 17 days   Today's Date: 07/25/2014 OT Individual Time:  1000- 1100       Problem List:  Patient Active Problem List   Diagnosis Date Noted  . Debility 07/25/2014  . Diabetic polyneuropathy associated with type 2 diabetes mellitus 07/25/2014  . Gout flare 07/24/2014  . Acute gout 07/22/2014  . CHF exacerbation 07/21/2014  . Pre-operative cardiovascular examination 07/15/2014  . Family history of malignant neoplasm of gastrointestinal tract 06/12/2014  . Male breast cancer 06/12/2014  . Cancer of male breast, right 05/29/2014  . Acute on chronic renal failure 04/29/2014  . 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 Acute on chronic systolic and diastolic heart failure, NYHA class 4 04/20/2014  . Diabetes mellitus 01/28/2014  . Panniculitis 04/14/2013  . Acute on chronic diastolic heart failure 54/49/2010  . Seasonal allergies 12/20/2012  . L1 vertebral fracture 09/07/2012  . Acute renal failure 08/30/2012  . Hypothyroidism 08/30/2012  . ETOH abuse   . Atrial fibrillation   . Chronic diastolic heart failure   . DM (diabetes mellitus) type II uncontrolled, periph vascular disorder 06/20/2009  . Obstructive sleep apnea 06/20/2009  . CAROTID STENOSIS 06/20/2009  . CHRONIC OBSTRUCTIVE PULMONARY DISEASE 06/20/2009  . CAD 06/20/2009  . Hypderlipidemia   . Hypertensive heart disease     Past Medical History:  Past Medical History  Diagnosis Date  . CHRONIC OBSTRUCTIVE PULMONARY DISEASE 06/20/2009  . OBSTRUCTIVE SLEEP APNEA 06/20/2009  . CAROTID STENOSIS 06/20/2009    A. 08/2001 s/p L CEA;  B.   09/14/11 - Carotid U/S - 40-59% bilateral stenosis, left CEA patch angioplasty is patent  . DM  06/20/2009  . CAD 06/20/2009    A.  08/2000 - s/p CABG x 4 - LIMA-LAD, Left Radial-OM, VG-DIAG, VG-RCA;  B. Neg. MV  2010  . HYPERLIPIDEMIA 06/20/2009  . HYPERTENSION 06/20/2009  . Hypothyroidism   . Low back pain   . Asthma     as child  . Pneumonia   . Atrial fibrillation     Not felt to be coumadin candidate 2/2 ETOH use.  Marland Kitchen ETOH abuse   . History of tobacco abuse     remote - quit 1970  . Bilateral renal cysts   . Marijuana abuse   . Morbidly obese   . CHF (congestive heart failure)   . Falls frequently   . Hx of cardiovascular stress test 05/2014    Lexiscan Myoview (8/15):  No ischemia; EF 63% - Normal Study  . Breast cancer, right breast   . Gout    Past Surgical History:  Past Surgical History  Procedure Laterality Date  . Carotid endarterectomy  2002    left  . Coronary artery bypass graft      x 4 - 2001  . Umbilical hernia repair N/A 01/28/2014    Procedure: HERNIA REPAIR UMBILICAL ADULT/INC;  Surgeon: Harl Bowie, MD;  Location: [facility];  Service: General;  Laterality: N/A;  . Laparotomy N/A 01/28/2014    Procedure: EXPLORATORY LAPAROTOMY;  Surgeon: Harl Bowie, MD;  Location: [facility];  Service: General;  Laterality: N/A;  . Bowel resection N/A 01/28/2014    Procedure: SMALL BOWEL RESECTION;  Surgeon: Harl Bowie, MD;  Location:  [facility];  Service: General;  Laterality: N/A;  . Hernia repair      Assessment & Plan Clinical Impression: Patient is a 68 y.o. year old male with history of OSA, HTN, CAF, ongoing alcohol abuse, chronic pain; who was admitted on 07/21/14 with fatigue, increased weakness and BLE edema. He was treated with IV lasix due to complaints of SOB and Dr. Haroldine Laws consulted for input. CT head with moderate generalized atrophy with progression since 2011. He felt that volume status was stable but patient with polyarticular gout flare in setting of ETOH abuse and added steroids for treatment. Uric acid 11.1 with TSH 5.74. X rays lumbar spine  with extensive spondylosis and osteoarthritic change with diffuse idiopathic skeletal hyperostosis. Therapy evaluation done and patient with significant balance deficits with ataxia as well as difficulty with mobility due to gout flare.  Patient transferred to CIR on 07/24/2014 .    Patient currently requires max with basic self-care skills secondary to muscle weakness, decreased cardiorespiratoy endurance and decreased oxygen support and decreased sitting balance, decreased standing balance, decreased postural control and decreased balance strategies.  Prior to hospitalization, patient could complete ADLs with modified independent .  Patient will benefit from skilled intervention to increase independence with basic self-care skills prior to discharge home with care partner.  Anticipate patient will require 24 hour supervision and minimal physical assistance and follow up TBD - HHOT vs outpatient.      Skilled Therapeutic Intervention OT evaluation initiated and completed this session. Pt with no c/o pain this session but very fearful and anxious with movement. Pt educated on OT purpose, POC, and goals. Pt requiring coaxing and maximal encouragement for participation in session. OT evaluation completed and treatment started. Pt does not have clothing for this session. Pt  Supine in bed upon entering the room. Stand pivot transfer to [facility] with Mod A for safety. Therapist assisted pt to bathroom with Mod - Max A transfer WC <> toilet with pt relying heavily on grab bar. Toileting assistance to hygiene and clothing management. Pt performed bathing tasks at sink side and refused STS at sink side. Pt transferred back into bed in above stated manner with assist to was B legs, peri area, and buttock. Session time coming to a close. Pt supine in bed resting with call bell and bed alarm on upon exiting the room.   OT Evaluation Precautions/Restrictions  Precautions Precautions: None Precaution Comments:  Dizziness Restrictions Weight Bearing Restrictions: No Vital Signs Therapy Vitals Pulse Rate: 69 BP: 143/74 mmHg Patient Position (if appropriate): Standing (posterior trunk lean; pt reports "head feeling not right") Oxygen Therapy SpO2: 99 % O2 Device: Nasal cannula O2 Flow Rate (L/min): 2 L/min Home Living/Prior Functioning Home Living Available Help at Discharge: Family Type of Home: House Home Access: Stairs to enter Technical brewer of Steps: 3 Entrance Stairs-Rails: Right;Left;Can reach both Home Layout: One level Additional Comments: Pt owns personal rolling walker and w/c  Lives With: Family;Daughter;Other (Comment) (and son-in-law) Prior Function Level of Independence: Requires assistive device for independence  Able to Take Stairs?: Yes Comments: Used the rollling walker for household and community mobility. States he was bathing and dressing independently. Daughter was assisting to get in/out of the shower until he got grab bars for the bathroom.  Vision/Perception  Vision- History Baseline Vision/History: Wears glasses Wears Glasses: At all times Patient Visual Report: Diplopia;Nausea/blurring vision with head movement;Other (comment) (no reported nausea) Vision- Assessment Vision Assessment?: Yes Eye Alignment: Within Functional Limits Ocular Range of  Motion: Within Functional Limits Tracking/Visual Pursuits: Able to track stimulus in all quads without difficulty Convergence: Within functional limits Visual Fields: No apparent deficits  Cognition Overall Cognitive Status: Within Functional Limits for tasks assessed Arousal/Alertness: Awake/alert Orientation Level: Oriented X4 Attention: Focused;Sustained Focused Attention: Appears intact Sustained Attention: Impaired Sustained Attention Impairment: Functional basic Memory: Appears intact Awareness: Impaired Awareness Impairment: Anticipatory impairment;Emergent impairment Problem Solving:  Impaired Problem Solving Impairment: Functional basic Executive Function: Self Correcting Self Correcting: Impaired Behaviors: Perseveration Safety/Judgment: Appears intact Comments: anxious, fearful of falling Sensation Sensation Light Touch: Impaired Detail Light Touch Impaired Details: Impaired RLE;Impaired LLE Stereognosis: Not tested Hot/Cold: Appears Intact Proprioception: Impaired Detail Proprioception Impaired Details: Impaired LLE;Impaired RLE Additional Comments: Light touch impaired in bilat LE's (stocking distribution) due to longstanding peripheral neuropathy, per pt. Coordination Gross Motor Movements are Fluid and Coordinated: No Fine Motor Movements are Fluid and Coordinated: No Motor  Motor Motor: Abnormal postural alignment and control Motor - Skilled Clinical Observations: generalized weakness Mobility  Bed Mobility Bed Mobility: Supine to Sit Supine to Sit: HOB flat;4: Min assist Supine to Sit Details (indicate cue type and reason): Min A to lift trunk Transfers Sit to Stand: 4: Min guard;4: Min assist;5: Supervision;From bed Sit to Stand Details: Manual facilitation for weight shifting;Verbal cues for precautions/safety;Tactile cues for posture Sit to Stand Details (indicate cue type and reason): manual facilitation of full anterior weight shift Stand to Sit: 4: Min assist Stand to Sit Details (indicate cue type and reason): Manual facilitation for weight shifting;Verbal cues for precautions/safety Stand to Sit Details: manual facilitation of full anterior weight shift  Trunk/Postural Assessment  Cervical Assessment Cervical Assessment: Exceptions to [facility] (forward head) Thoracic Assessment Thoracic Assessment: Exceptions to [facility] (kyphosis) Lumbar Assessment Lumbar Assessment: Exceptions to [facility] (posterior pelvic tilt) Postural Control Postural Control: Deficits on evaluation Trunk Control: L lateral lean in standing, Mod A for balance during dynamic  standing activities  Balance Balance Balance Assessed: Yes Static Sitting Balance Static Sitting - Balance Support: Feet supported;No upper extremity supported Static Sitting - Level of Assistance: 5: Stand by assistance Static Standing Balance Static Standing - Balance Support: Right upper extremity supported Static Standing - Level of Assistance: 4: Min assist Dynamic Standing Balance Dynamic Standing - Balance Support: Right upper extremity supported Dynamic Standing - Level of Assistance: 2: Max assist Dynamic Standing - Comments: LB bathing and dressing Extremity/Trunk Assessment RUE Assessment RUE Assessment: Within Functional Limits LUE Assessment LUE Assessment: Within Functional Limits  FIM:  FIM - Grooming Grooming Steps: Wash, rinse, dry face;Wash, rinse, dry hands;Oral care, brush teeth, clean dentures;Brush, comb hair Grooming: 4: Patient completes 3 of 4 or 4 of 5 steps FIM - Bathing Bathing Steps Patient Completed: Chest;Right Arm;Left Arm;Abdomen Bathing: 2: Max-Patient completes 3-4 [id number] parts or 25-49% FIM - Upper Body Dressing/Undressing Upper body dressing/undressing: 0: Activity did not occur FIM - Lower Body Dressing/Undressing Lower body dressing/undressing: 0: Wears gown/pajamas-no public clothing FIM - TMusicianDevices: Grab bar or rail for support FIM - BControl and instrumentation engineerDevices: Arm rests Bed/Chair Transfer: 3: Bed > Chair or W/C: Mod A (lift or lower assist);3: Chair or W/C > Bed: Mod A (lift or lower assist) FIM - TRadio producerDevices: Elevated toilet seat;Grab bars Toilet Transfers: 3-To toilet/BSC: Mod A (lift or lower assist);3-From toilet/BSC: Mod A (lift or lower assist) FIM - Tub/Shower Transfers Tub/shower Transfers: 0-Activity did not occur or was simulated   Refer to Care Plan for  Long Term Goals  Recommendations for other services:  Neuropsych  Discharge Criteria: Patient will be discharged from OT if patient refuses treatment 3 consecutive times without medical reason, if treatment goals not met, if there is a change in medical status, if patient makes no progress towards goals or if patient is discharged from hospital.  The above assessment, treatment plan, treatment alternatives and goals were discussed and mutually agreed upon: by patient  Phineas Semen 07/25/2014, 12:48 PM

## 2014-07-25 NOTE — Care Management Note (Signed)
[facility] Individual Statement of Services  Patient Name:  [name]  Date:  07/25/2014  Welcome to the [facility].  Our goal is to provide you with an individualized program based on your diagnosis and situation, designed to meet your specific needs.  With this comprehensive rehabilitation program, you will be expected to participate in at least 3 hours of rehabilitation therapies Monday-Friday, with modified therapy programming on the weekends.  Your rehabilitation program will include the following services:  Physical Therapy (PT), Occupational Therapy (OT), 24 hour per day rehabilitation nursing, Case Management (Social Worker), Rehabilitation Medicine, Nutrition Services and Pharmacy Services  Weekly team conferences will be held on Wednesday to discuss your progress.  Your Social Worker will talk with you frequently to get your input and to update you on team discussions.  Team conferences with you and your family in attendance may also be held.  Expected length of stay: 14-17 days  Overall anticipated outcome: supervision/min level  Depending on your progress and recovery, your program may change. Your Social Worker will coordinate services and will keep you informed of any changes. Your Social Worker's name and contact numbers are listed  below.  The following services may also be recommended but are not provided by the [facility] will be made to provide these services after discharge if needed.  Arrangements include referral to agencies that provide these services.  Your insurance has been verified to be:  [place] primary doctor is:  Dr Jene Every  Pertinent information will be shared with your doctor and your insurance company.  Social Worker:  Ovidio Kin, [place] or ([phone]  Information discussed with and copy given to patient by: Elease Hashimoto, 07/25/2014, 2:23 PM

## 2014-07-25 NOTE — Evaluation (Addendum)
Physical Therapy Assessment and Plan  Patient Details  Name: [name] MRN: [id number] Date of Birth: 1945-12-25  PT Diagnosis: Abnormal posture, Abnormality of gait, Ataxia, Coordination disorder, Dizziness and giddiness, Edema, Impaired sensation, Muscle weakness and Pain in joint Rehab Potential: Fair ELOS: 14-17 days   Today's Date: 07/25/2014 PT Individual Time: 1110-1210 and 1330-1439 PT Individual Time Calculation (min):60 min and 69 min    Problem List:  Patient Active Problem List   Diagnosis Date Noted  . Debility 07/25/2014  . Diabetic polyneuropathy associated with type 2 diabetes mellitus 07/25/2014  . Gout flare 07/24/2014  . Acute gout 07/22/2014  . CHF exacerbation 07/21/2014  . Pre-operative cardiovascular examination 07/15/2014  . Family history of malignant neoplasm of gastrointestinal tract 06/12/2014  . Male breast cancer 06/12/2014  . Cancer of male breast, right 05/29/2014  . Acute on chronic renal failure 04/29/2014  . 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 Acute on chronic systolic and diastolic heart failure, NYHA class 4 04/20/2014  . Diabetes mellitus 01/28/2014  . Panniculitis 04/14/2013  . Acute on chronic diastolic heart failure 70/35/0093  . Seasonal allergies 12/20/2012  . L1 vertebral fracture 09/07/2012  . Acute renal failure 08/30/2012  . Hypothyroidism 08/30/2012  . ETOH abuse   . Atrial fibrillation   . Chronic diastolic heart failure   . DM (diabetes mellitus) type II uncontrolled, periph vascular disorder 06/20/2009  . Obstructive sleep apnea 06/20/2009  . CAROTID STENOSIS 06/20/2009  . CHRONIC OBSTRUCTIVE PULMONARY DISEASE 06/20/2009  . CAD 06/20/2009  . Hypderlipidemia   . Hypertensive heart disease    Past Medical History:  Past Medical History  Diagnosis Date  . CHRONIC OBSTRUCTIVE PULMONARY DISEASE 06/20/2009  . OBSTRUCTIVE SLEEP APNEA 06/20/2009  . CAROTID STENOSIS 06/20/2009     A. 08/2001 s/p L CEA;  B.   09/14/11 - Carotid U/S - 40-59% bilateral stenosis, left CEA patch angioplasty is patent  . DM 06/20/2009  . CAD 06/20/2009    A.  08/2000 - s/p CABG x 4 - LIMA-LAD, Left Radial-OM, VG-DIAG, VG-RCA;  B. Neg. MV  2010  . HYPERLIPIDEMIA 06/20/2009  . HYPERTENSION 06/20/2009  . Hypothyroidism   . Low back pain   . Asthma     as child  . Pneumonia   . Atrial fibrillation     Not felt to be coumadin candidate 2/2 ETOH use.  Marland Kitchen ETOH abuse   . History of tobacco abuse     remote - quit 1970  . Bilateral renal cysts   . Marijuana abuse   . Morbidly obese   . CHF (congestive heart failure)   . Falls frequently   . Hx of cardiovascular stress test 05/2014    Lexiscan Myoview (8/15):  No ischemia; EF 63% - Normal Study  . Breast cancer, right breast   . Gout    Past Surgical History:  Past Surgical History  Procedure Laterality Date  . Carotid endarterectomy  2002    left  . Coronary artery bypass graft      x 4 - 2001  . Umbilical hernia repair N/A 01/28/2014    Procedure: HERNIA REPAIR UMBILICAL ADULT/INC;  Surgeon: Harl Bowie, MD;  Location: [place];  Service: General;  Laterality: N/A;  . Laparotomy N/A 01/28/2014    Procedure: EXPLORATORY LAPAROTOMY;  Surgeon: Harl Bowie, MD;  Location: [facility];  Service: General;  Laterality: N/A;  . Bowel resection N/A 01/28/2014  Procedure: SMALL BOWEL RESECTION;  Surgeon: Harl Bowie, MD;  Location: [place];  Service: General;  Laterality: N/A;  . Hernia repair     Assessment & Plan Clinical Impression: ANTWAIN CALIENDO is a 68 y.o. male with history of OSA, HTN, CAF, ongoing alcohol abuse, chronic pain; who was admitted on 07/21/14 with fatigue, increased weakness and BLE edema. He was treated with IV lasix due to complaints of SOB and Dr. Haroldine Laws consulted for input. CT head with moderate generalized atrophy with progression since 2011. He felt that volume status was stable but patient with polyarticular  gout flare in setting of ETOH abuse and added steroids for treatment. Uric acid 11.1 with TSH 5.74. X rays lumbar spine with extensive spondylosis and osteoarthritic change with diffuse idiopathic skeletal hyperostosis. Therapy evaluation done and patient with significant balance deficits with ataxia as well as difficulty with mobility due to gout flare.Patient transferred to CIR on 07/24/2014 .   Patient currently requires mod to +2Total Assist with mobility secondary to muscle weakness, decreased cardiorespiratoy endurance, decreased midline orientation, decreased awareness, disequilibrium and decreased standing balance, decreased postural control and decreased balance strategies.  Prior to hospitalization, patient was modified independent  with mobility and lived with Family;Daughter;Other (Comment) (and son-in-law) in a House home.  Home access is 3Stairs to enter.  Patient will benefit from skilled PT intervention to maximize safe functional mobility and minimize fall risk for planned discharge home with 24 hour supervision.  Anticipate patient will benefit from follow up [place] at discharge.  PT - End of Session Activity Tolerance: Tolerates 30+ min activity with multiple rests Endurance Deficit: Yes PT Assessment Rehab Potential: Fair Barriers to Discharge: Other (comment) (None at this time) PT Patient demonstrates impairments in the following area(s): Balance;Edema;Behavior;Endurance;Motor;Pain;Perception;Safety;Sensory PT Transfers Functional Problem(s): Bed Mobility;Bed to Chair;Car;Furniture PT Locomotion Functional Problem(s): Ambulation;Wheelchair Mobility;Stairs PT Plan PT Intensity: Minimum of 1-2 x/day ,45 to 90 minutes PT Frequency: 5 out of 7 days PT Duration Estimated Length of Stay: 14-17 days PT Treatment/Interventions: Ambulation/gait training;Balance/vestibular training;Cognitive remediation/compensation;Discharge planning;Functional mobility training;DME/adaptive equipment  instruction;Pain management;Patient/family education;Neuromuscular re-education;Splinting/orthotics;Therapeutic Exercise;Visual/perceptual remediation/compensation;UE/LE Coordination activities;Therapeutic Activities;Stair training;UE/LE Strength taining/ROM;Wheelchair propulsion/positioning PT Transfers Anticipated Outcome(s): Supervision PT Locomotion Anticipated Outcome(s): Supervision to [employer] A PT Recommendation Recommendations for Other Services: Neuropsych consult;Other (comment) (Pt participation limited by anxiety) Follow Up Recommendations: 24 hour supervision/assistance;Home health PT Patient destination: Home Equipment Recommended: To be determined  Skilled Therapeutic Intervention Treatment Session 1: PT eval performed; see below for detailed findings. Treatment initiated. Session focused on addressing symptoms of lightheadedness, disequilibrium to increase safety with functional mobility. Vital signs taken in seated then standing (symptomatic; pt reporting "head doesn't feel right," per pt) with 9 mm Hg change in diastolic BP with positional change. In standing, pt demonstrated significant posterior trunk lean. See vital signs for detailed readings. Pt perseverated on idea that lightheadedness was due to an "inner ear problem," per pt.  Donned Ted hose. Performed sit>stand with no symptoms. In standing, pt performed mini squats x5 reps, sidestepping x2 steps to L and x5 steps to R; forward/retro steps x2 with mod A, multimodal cueing for lateral weight shift to R side to enable LLE advancement. Pt on 2 L/min supplemental O2 via Max during/after session with SpO2 >95% throughout. See below for further detail regarding assist/cueing required wiht bed mobility, functional transfers, w/c mobility, and gait without assistive device. Departed with pt seated in w/c with all needs within reach on 2 L/min of supplemental O2 via [place] with SpO2  99%.  Treatment Session 2: Pt received seated in w/c;  agreeable to therapy. Per pt c/o pain in bilat LE's (RN aware), retrieved elevating leg rests to address LE edema. Session focused on gait training and stair negotiation. See below for detailed description of assist/cueing required. Initiated vestibular evaluation; see separate evaluation for detailed findings. Educated pt and daughter on findings, goals, and plan of care. Oriented pt and daughger to rehab unit, fall precautions. Pt/daughter verbalized understanding of all education andwere n full agreement with plan of care. Departed with pt seated in w/c, daughter present, and all needs within reach. Pt on 2 L/min supplemental O2, [place] with vital signs stable.  PT Evaluation Precautions/Restrictions Precautions Precautions: None Precaution Comments: Dizziness Restrictions Weight Bearing Restrictions: No General   Vital SignsTherapy Vitals Pulse Rate: 69 BP: 143/74 mmHg Patient Position (if appropriate): Standing (posterior trunk lean; pt reports "head feeling not right") Oxygen Therapy SpO2: 99 % O2 Device: Nasal cannula O2 Flow Rate (L/min): 2 L/min Pain Pain Assessment Pain Assessment: No/denies pain Pain Score: 4  Home Living/Prior Functioning Home Living Available Help at Discharge: Family Type of Home: House Home Access: Stairs to enter Technical brewer of Steps: 3 Entrance Stairs-Rails: Right;Left;Can reach both Home Layout: One level Additional Comments: Pt owns personal rolling walker and w/c  Lives With: Family;Daughter;Other (Comment) (and son-in-law) Prior Function Level of Independence: Requires assistive device for independence  Able to Take Stairs?: Yes Comments: Used the rollling walker for household and community mobility. States he was bathing and dressing independently. Daughter was assisting to get in/out of the shower until he got grab bars for the bathroom.  Vision/Perception  Vision - Assessment Saccades: Undershoots;Other (comment) (minimal  overshooting with vertical saccades) Diplopia Assessment: Only with right gaze;Other (comment);Present in near gaze (Reports diplopia with vision in R superior field, near gaze)  Cognition Overall Cognitive Status: Within Functional Limits for tasks assessed Arousal/Alertness: Awake/alert Orientation Level: Oriented X4 Attention: Focused;Sustained Focused Attention: Appears intact Sustained Attention: Impaired Sustained Attention Impairment: Functional basic Memory: Appears intact Awareness: Impaired Awareness Impairment: Anticipatory impairment;Emergent impairment Problem Solving: Impaired Problem Solving Impairment: Functional basic Executive Function: Self Correcting Self Correcting: Impaired Behaviors: Perseveration Safety/Judgment: Appears intact Sensation Sensation Light Touch: Impaired Detail Light Touch Impaired Details: Impaired RLE;Impaired LLE Proprioception: Impaired Detail Proprioception Impaired Details: Impaired LLE;Impaired RLE Additional Comments: Light touch impaired in bilat LE's (stocking distribution) due to longstanding peripheral neuropathy, per pt. Coordination Gross Motor Movements are Fluid and Coordinated: No Fine Motor Movements are Fluid and Coordinated: No Motor  Motor Motor: Abnormal postural alignment and control Motor - Skilled Clinical Observations: generalized weakness  Mobility Bed Mobility Bed Mobility: Supine to Sit Supine to Sit: HOB flat;4: Min assist Supine to Sit Details (indicate cue type and reason): Min A to lift trunk Transfers Transfers: Yes Sit to Stand: 4: Min guard;4: Min assist;5: Supervision;From bed Sit to Stand Details: Manual facilitation for weight shifting;Verbal cues for precautions/safety;Tactile cues for posture Sit to Stand Details (indicate cue type and reason): manual facilitation of full anterior weight shift Stand to Sit: 4: Min assist Stand to Sit Details (indicate cue type and reason): Manual facilitation  for weight shifting;Verbal cues for precautions/safety Stand to Sit Details: manual facilitation of full anterior weight shift Stand Pivot Transfers: 3: Mod assist;Other (comment) (L HHA) Stand Pivot Transfer Details: Manual facilitation for weight shifting Stand Pivot Transfer Details (indicate cue type and reason): manual facilitation and verbal cueing for lateral weight shift to R side Locomotion  Ambulation Ambulation:  Yes Ambulation/Gait Assistance: 1: +2 Total assist;3: Mod assist (+2a for w/c follow, safety) Ambulation Distance (Feet): 2 Feet (2' with L HHA; 25' with rolling walker) Assistive device: Rolling walker;1 person hand held assist Ambulation/Gait Assistance Details: Tactile cues for weight shifting;Tactile cues for posture Ambulation/Gait Assistance Details: Pt ambulated forward x2 steps with mod A, L HHA; later performed ambulation x25' in controlled environment with rolling walker and +2A for w/c follow, safety, and to recover from significant LOB posterior and to L side. Gait Gait: Yes Gait Pattern: Impaired Gait Pattern: Step-through pattern;Decreased hip/knee flexion - left;Ataxic;Decreased trunk rotation;Lateral trunk lean to left;Trunk flexed;Wide base of support;Decreased weight shift to right;Left steppage;Decreased dorsiflexion - left High Level Ambulation High Level Ambulation: Side stepping;Backwards walking Side Stepping: x2 steps to L, 5 steps to R with mod A, L HHA Backwards Walking: x2 steps with mod A, L HHA Stairs / Additional Locomotion Stairs: Yes Stairs Assistance: 1: +2 Total assist Stairs Assistance Details (indicate cue type and reason): Pt negotiated 3 stairs with step-to pattern. Ascended forward-facing with mod A and cueing for technique, sequencing. Descended laterally with bilat UE support at R rail but required +2A to descend final step due to onset of pt anxiety due to pt stating, "I'm going to fall" due to feeling "woozy" and lightheaded on  final step. Stair Management Technique: Two rails;One rail Right;Forwards;Sideways;Step to pattern Number of Stairs: 3 Wheelchair Mobility Wheelchair Mobility: Yes Wheelchair Assistance: 3: Building surveyor Details: Verbal cues for technique;Verbal cues for safe use of DME/AE Wheelchair Propulsion: Both upper extremities Wheelchair Parts Management: Needs assistance Distance: 35  Trunk/Postural Assessment  Cervical Assessment Cervical Assessment: Exceptions to [facility] (lower cervical flexion, upper cervical extension) Thoracic Assessment Thoracic Assessment: Exceptions to [facility] (thoracic kyphosis) Lumbar Assessment Lumbar Assessment: Exceptions to [facility] (posterior pelvic tilt) Postural Control Postural Control: Deficits on evaluation Trunk Control: Lateral trunk lean to L side in standing with L HHA; unable to correct with verbal cueing Righting Reactions: No hip, ankle, or stepping strategy  Balance Balance Balance Assessed: Yes Static Sitting Balance Static Sitting - Balance Support: Feet supported;No upper extremity supported Static Sitting - Level of Assistance: 5: Stand by assistance Static Standing Balance Static Standing - Balance Support: Left upper extremity supported Static Standing - Level of Assistance: 4: Min assist Dynamic Standing Balance Dynamic Standing - Balance Support: Bilateral upper extremity supported (at rolling walker) Dynamic Standing - Level of Assistance: 2: Max assist;1: +2 Total assist Extremity Assessment  RLE Assessment RLE Assessment: Exceptions to [facility] RLE Strength RLE Overall Strength: Deficits RLE Overall Strength Comments: Grossly 3+/5 to 4-/5 overall. LLE Assessment LLE Assessment: Exceptions to WFL LLE AROM (degrees) Overall AROM Left Lower Extremity: Deficits;Due to pain LLE Overall AROM Comments: L ankle dorsiflexion limited to neutral due to pain LLE PROM (degrees) Overall PROM Left Lower Extremity: Deficits;Due to  pain LLE Overall PROM Comments: L ankle dorsiflexion PROM not assessed due to pain with AROM LLE Strength LLE Overall Strength: Deficits;Due to pain LLE Overall Strength Comments: Grossly 3+/5 to 4-/5 in L hip/knee and plantarflexors. 2+/5 and painful L ankle dorsiflexion  FIM:  FIM - Bed/Chair Transfer Bed/Chair Transfer Assistive Devices: Arm rests Bed/Chair Transfer: 4: Supine > Sit: Min A (steadying Pt. > 75%/lift 1 leg);5: Sit > Supine: Supervision (verbal cues/safety issues);3: Bed > Chair or W/C: Mod A (lift or lower assist) FIM - Locomotion: Wheelchair Distance: 35 Locomotion: Wheelchair: 1: Travels less than 50 ft with moderate assistance (Pt: 50 - 74%) FIM -  Locomotion: Ambulation Locomotion: Ambulation Assistive Devices: Other (comment) (L HHA) Ambulation/Gait Assistance: 3: Mod assist Locomotion: Ambulation: 1: Travels less than 50 ft with moderate assistance (Pt: 50 - 74%)   Refer to Care Plan for Long Term Goals  Recommendations for other services: Neuropsych  Discharge Criteria: Patient will be discharged from PT if patient refuses treatment 3 consecutive times without medical reason, if treatment goals not met, if there is a change in medical status, if patient makes no progress towards goals or if patient is discharged from hospital.  The above assessment, treatment plan, treatment alternatives and goals were discussed and mutually agreed upon: by patient and by family  Stefano Gaul 07/25/2014, 8:08 PM

## 2014-07-25 NOTE — Progress Notes (Signed)
Patient information reviewed and entered into eRehab system by Zaina Jenkin, RN, CRRN, PPS Coordinator.  Information including medical coding and functional independence measure will be reviewed and updated through discharge.     Per nursing patient was given "Data Collection Information Summary for Patients in Inpatient Rehabilitation Facilities with attached "Privacy Act Statement-Health Care Records" upon admission.  

## 2014-07-26 ENCOUNTER — Inpatient Hospital Stay (HOSPITAL_COMMUNITY): Payer: Medicare Other | Admitting: Occupational Therapy

## 2014-07-26 ENCOUNTER — Ambulatory Visit (HOSPITAL_COMMUNITY): Payer: Medicare Other | Admitting: *Deleted

## 2014-07-26 ENCOUNTER — Inpatient Hospital Stay (HOSPITAL_COMMUNITY): Payer: Medicare Other | Admitting: Physical Therapy

## 2014-07-26 DIAGNOSIS — R5381 Other malaise: Secondary | ICD-10-CM

## 2014-07-26 DIAGNOSIS — E1022 Type 1 diabetes mellitus with diabetic chronic kidney disease: Secondary | ICD-10-CM

## 2014-07-26 DIAGNOSIS — I5032 Chronic diastolic (congestive) heart failure: Secondary | ICD-10-CM

## 2014-07-26 DIAGNOSIS — N185 Chronic kidney disease, stage 5: Secondary | ICD-10-CM

## 2014-07-26 LAB — BASIC METABOLIC PANEL
ANION GAP: 10 (ref 5–15)
BUN: 49 mg/dL — ABNORMAL HIGH (ref 6–23)
CO2: 33 mEq/L — ABNORMAL HIGH (ref 19–32)
Calcium: 9.4 mg/dL (ref 8.4–10.5)
Chloride: 93 mEq/L — ABNORMAL LOW (ref 96–112)
Creatinine, Ser: 0.92 mg/dL (ref 0.50–1.35)
GFR, EST NON AFRICAN AMERICAN: 83 mL/min — AB (ref >90)
Glucose, Bld: 160 mg/dL — ABNORMAL HIGH (ref 70–99)
POTASSIUM: 3.8 meq/L (ref 3.7–5.3)
Sodium: 136 mEq/L — ABNORMAL LOW (ref 137–147)

## 2014-07-26 LAB — GLUCOSE, CAPILLARY
GLUCOSE-CAPILLARY: 151 mg/dL — AB (ref 70–99)
GLUCOSE-CAPILLARY: 197 mg/dL — AB (ref 70–99)
Glucose-Capillary: 122 mg/dL — ABNORMAL HIGH (ref 70–99)
Glucose-Capillary: 217 mg/dL — ABNORMAL HIGH (ref 70–99)

## 2014-07-26 NOTE — IPOC Note (Signed)
Overall Plan of Care [facility]) Patient Details Name: [name] MRN: [id number] DOB: May 21, 1946  Admitting Diagnosis: Polyarticular gout  Hospital Problems: Active Problems:   Gout flare   Debility   Diabetic polyneuropathy associated with type 2 diabetes mellitus     Functional Problem List: Nursing Behavior;Bladder;Bowel;Edema;Endurance;Medication Management;Motor;Nutrition;Pain;Perception;Safety;Sensory;Skin Integrity  PT Balance;Edema;Behavior;Endurance;Motor;Pain;Perception;Safety;Sensory  OT Balance;Edema;Endurance;Motor;Safety;Sensory  SLP    TR Edema;Endurance;Motor;Safety;Pain       Basic ADL's: OT Grooming;Bathing;Dressing;Toileting     Advanced  ADL's: OT Other (comment) (does not perform these tasks - family performs)     Transfers: PT Bed Mobility;Bed to Chair;Car;Furniture  OT Toilet;Tub/Shower (pt reports walk in shower)     Locomotion: PT Ambulation;Wheelchair Mobility;Stairs     Additional Impairments: OT    SLP        TR      Anticipated Outcomes Item Anticipated Outcome  Self Feeding n/a  Swallowing      Basic self-care  supervision  Toileting  supervision   Bathroom Transfers supervision  Bowel/Bladder  Mod assist  Transfers  Supervision  Locomotion  Supervision to Min A  Communication     Cognition     Pain  <3 on a 0-10 scale  Safety/Judgment  mod assist   Therapy Plan: PT Intensity: Minimum of 1-2 x/day ,45 to 90 minutes PT Frequency: 5 out of 7 days PT Duration Estimated Length of Stay: 14-17 days OT Intensity: Minimum of 1-2 x/day, 45 to 90 minutes OT Frequency: 5 out of 7 days OT Duration/Estimated Length of Stay: 14- 17 days         Team Interventions: Nursing Interventions Patient/Family Education;Bladder Management;Bowel Management;Disease Management/Prevention;Pain Management;Medication Management;Skin Care/Wound Management;Dysphagia/Aspiration Precaution Training;Cognitive Remediation/Compensation;Discharge  Planning;Psychosocial Support  PT interventions Ambulation/gait training;Balance/vestibular training;Cognitive remediation/compensation;Discharge planning;Functional mobility training;DME/adaptive equipment instruction;Pain management;Patient/family education;Neuromuscular re-education;Splinting/orthotics;Therapeutic Exercise;Visual/perceptual remediation/compensation;UE/LE Coordination activities;Therapeutic Activities;Stair training;UE/LE Strength taining/ROM;Wheelchair propulsion/positioning  OT Interventions Balance/vestibular training;Community reintegration;Patient/family education;Self Care/advanced ADL retraining;Therapeutic Exercise;UE/LE Coordination activities;Wheelchair propulsion/positioning;UE/LE Strength taining/ROM;Therapeutic Activities;Psychosocial support;Functional mobility training;DME/adaptive equipment instruction;Discharge planning  SLP Interventions    TR Interventions Adaptive equipment instruction;1:1 session;Balance/vestibular training;Functional mobility training;Community reintegration;Patient/family education;Therapeutic activities;Recreation/leisure participation;Therapeutic exercise;UE/LE Coordination activities;Wheelchair propulsion/positioning  SW/CM Interventions Discharge Planning;Psychosocial Support;Patient/Family Education    Team Discharge Planning: Destination: PT-Home ,OT- Home , SLP-  Projected Follow-up: PT-24 hour supervision/assistance;Home health PT, OT-  Other (comment) (HHOT vs outpatient OT services), SLP-  Projected Equipment Needs: PT-To be determined, OT- To be determined, SLP-  Equipment Details: PT- , OT-Pt reports owning RW, WC, and shower chair Patient/family involved in discharge planning: PT- Patient;Family member/caregiver,  OT-Patient, SLP-   MD ELOS: [id number]    Medical Rehab Prognosis:  Good Assessment: 68 y.o. male with history of OSA, HTN, CAF, ongoing alcohol abuse, chronic pain; who was admitted on 07/21/14 with fatigue, increased  weakness and BLE edema. He was treated with IV lasix due to complaints of SOB and Dr. Haroldine Laws consulted for input. CT head with moderate generalized atrophy with progression since 2011. He felt that volume status was stable but patient with polyarticular gout flare in setting of ETOH abuse and added steroids for treatment. Uric acid 11.1 with TSH 5.74. X rays lumbar spine with extensive spondylosis and osteoarthritic change with diffuse idiopathic skeletal hyperostosis.   Now requiring 24/7 Rehab RN,MD, as well as CIR level PT, OT and SLP.  Treatment team will focus on ADLs and mobility with goals set at Sup   See Team Conference Notes for weekly updates to the plan of care

## 2014-07-26 NOTE — Progress Notes (Signed)
Occupational Therapy Session Note  Patient Details  Name: [name] MRN: [id number] Date of Birth: 06-17-1946  Today's Date: 07/26/2014 OT Individual Time: 8101-7510 OT Individual Time Calculation (min): 60 min    Short Term Goals: Week 1:  OT Short Term Goal 1 (Week 1): Pt will engage in 10 minutes of functional task without requiring rest break in order to increase endurance for functional tasks.  OT Short Term Goal 2 (Week 1): Pt will perform LB dressing with Mod A in order to increase I in self care. OT Short Term Goal 3 (Week 1): Pt will perform toileting with Mod A in order to increase I in self care. OT Short Term Goal 4 (Week 1): Pt will perform shower transfer with Min A in order to increase I in functional transfer.  OT Short Term Goal 5 (Week 1): Pt will perform toilet transfer with Min A in order to increase I in functional transfers.  Skilled Therapeutic Interventions/Progress Updates:    Engaged in ADL retraining with focus on functional transfers, sit <> stand, standing balance, and increased independence with self-care tasks of bathing and dressing.  Pt reports having a better day today and willing to participate in bathing at shower level.  Performed stand pivot transfer w/c to tub bench with heavy use of grab bars.  Pt required max assist with bathing secondary to body habitus and decreased sensation in BUE. Pt tolerated bathing at sit > stand level in shower without rest break and remained on room air for entire session.  Pt donned shirt seated at sink with assist to pull over trunk.  Stand pivot w/c > bed for RN to apply microguard powder to skin folds.  Due to session coming to a close, pt left at bed level with RN to assist with LB dressing after meeting medical needs.  Therapy Documentation Precautions:  Precautions Precautions: None Precaution Comments: Dizziness Restrictions Weight Bearing Restrictions: No General:   Vital Signs: Oxygen Therapy SpO2: 98  % O2 Device: Nasal cannula O2 Flow Rate (L/min): 2 L/min Pain:  pt with c/o pain in Lt knee, RN premedicated  See FIM for current functional status  Therapy/Group: Individual Therapy  Simonne Come 07/26/2014, 10:51 AM

## 2014-07-26 NOTE — Interval H&P Note (Deleted)
[name] was admitted today to Inpatient Rehabilitation with the diagnosis of gout flare and severe diabetic peripheral neuropathy.  The patient's history has been reviewed, patient examined, and there is no change in status.  Patient continues to be appropriate for intensive inpatient rehabilitation.  I have reviewed the patient's chart and labs.  Questions were answered to the patient's satisfaction.  Alysia Penna E 07/26/2014, 1:24 PM

## 2014-07-26 NOTE — Plan of Care (Signed)
Problem: RH PAIN MANAGEMENT Goal: RH STG PAIN MANAGED AT OR BELOW PT'S PAIN GOAL At or below 7  Outcome: Not Progressing Pt educated on need to call for PRN meds.

## 2014-07-26 NOTE — H&P (View-Only) (Signed)
Physical Medicine and Rehabilitation Admission H&P  Chief Complaint   Patient presents with   .  Gout flare   HPI: [name] is a 68 y.o. male with history of OSA, HTN, CAF, ongoing alcohol abuse, chronic pain; who was admitted on 07/21/14 with fatigue, increased weakness and BLE edema. He was treated with IV lasix due to complaints of SOB and Dr. Haroldine Laws consulted for input. CT head with moderate generalized atrophy with progression since 2011. He felt that volume status was stable but patient with polyarticular gout flare in setting of ETOH abuse and added steroids for treatment. Uric acid 11.1 with TSH 5.74. X rays lumbar spine with extensive spondylosis and osteoarthritic change with diffuse idiopathic skeletal hyperostosis. Therapy evaluation done and patient with significant balance deficits with ataxia as well as difficulty with mobility due to gout flare. CIR recommended by MD and therapy team.  Shoulders feel better, R elbow still tender  Review of Systems  Eyes: Positive for double vision (on and off. ).  Musculoskeletal: Positive for back pain, falls and myalgias.  Neurological: Positive for dizziness (with activity. ).   Past Medical History   Diagnosis  Date   .  CHRONIC OBSTRUCTIVE PULMONARY DISEASE  06/20/2009   .  OBSTRUCTIVE SLEEP APNEA  06/20/2009   .  CAROTID STENOSIS  06/20/2009     A. 08/2001 s/p L CEA; B. 09/14/11 - Carotid U/S - 40-59% bilateral stenosis, left CEA patch angioplasty is patent   .  DM  06/20/2009   .  CAD  06/20/2009     A. 08/2000 - s/p CABG x 4 - LIMA-LAD, Left Radial-OM, VG-DIAG, VG-RCA; B. Neg. MV 2010   .  HYPERLIPIDEMIA  06/20/2009   .  HYPERTENSION  06/20/2009   .  Hypothyroidism    .  Low back pain    .  Asthma      as child   .  Pneumonia    .  Atrial fibrillation      Not felt to be coumadin candidate 2/2 ETOH use.   Marland Kitchen  ETOH abuse    .  History of tobacco abuse      remote - quit 1970   .  Bilateral renal cysts    .  Marijuana abuse     .  Morbidly obese    .  CHF (congestive heart failure)    .  Falls frequently    .  Hx of cardiovascular stress test  05/2014     Lexiscan Myoview (8/15): No ischemia; EF 63% - Normal Study   .  Breast cancer, right breast    .  Gout     Past Surgical History   Procedure  Laterality  Date   .  Carotid endarterectomy   2002     left   .  Coronary artery bypass graft       x 4 - 2001   .  Umbilical hernia repair  N/A  01/28/2014     Procedure: HERNIA REPAIR UMBILICAL ADULT/INC; Surgeon: Harl Bowie, MD; Location: [facility]; Service: General; Laterality: N/A;   .  Laparotomy  N/A  01/28/2014     Procedure: EXPLORATORY LAPAROTOMY; Surgeon: Harl Bowie, MD; Location: [facility]; Service: General; Laterality: N/A;   .  Bowel resection  N/A  01/28/2014     Procedure: SMALL BOWEL RESECTION; Surgeon: Harl Bowie, MD; Location: [facility]; Service: General; Laterality: N/A;   .  Hernia repair  Family History   Problem  Relation  Age of Onset   .  Stroke  Mother      ?   Marland Kitchen  Cancer  Mother      unknown type of cancer   .  Stroke  Father      ?   Marland Kitchen  Heart attack  Neg Hx    .  Cancer  Brother  2     stomach cancer    Social History: Lives with family--daughter at home. Independent with walker PTA. He reports that he quit smoking about 45 years ago. He does not have any smokeless tobacco history on file. He reports that he drinks a 1/5 of liquor daily. He reports that he uses illicit drugs (Marijuana).    Allergies   Allergen  Reactions   .  Erythromycin  Hives    Medications Prior to Admission   Medication  Sig  Dispense  Refill   .  aspirin 81 MG chewable tablet  Chew 1 tablet (81 mg total) by mouth daily.  30 tablet  1   .  atorvastatin (LIPITOR) 20 MG tablet  Take 20 mg by mouth daily.     .  carvedilol (COREG) 3.125 MG tablet  Take 1 tablet (3.125 mg total) by mouth 2 (two) times daily with a meal.  60 tablet  5   .  colchicine 0.6 MG tablet  Take 0.6 mg by mouth daily  as needed (for gout).     .  Dimethicone (MOISTURE BARRIER EX)  Apply 1 application topically daily as needed (Gout under stomach). Apply after showers     .  FLUoxetine (PROZAC) 20 MG capsule  Take 20 mg by mouth daily.     .  Fluticasone Furoate 200 MCG/ACT AEPB  Inhale 1 puff into the lungs 2 (two) times daily.     .  furosemide (LASIX) 80 MG tablet  Take 2 tablets (160 mg total) by mouth 2 (two) times daily.  120 tablet  5   .  gabapentin (NEURONTIN) 300 MG capsule  Take 1 capsule (300 mg total) by mouth daily.     .  Insulin Glargine (LANTUS SOLOSTAR) 100 UNIT/ML Solostar Pen  Inject 16 Units into the skin at bedtime.     Marland Kitchen  lactulose (CHRONULAC) 10 GM/15ML solution  Take 20 g by mouth 2 (two) times daily.     Marland Kitchen  levothyroxine (SYNTHROID, LEVOTHROID) 150 MCG tablet  Take 150 mcg by mouth at bedtime.     .  metolazone (ZAROXOLYN) 5 MG tablet  Take one tablet by mouth 30 minutes before AM Lasix on Monday. Take extra potassium tab on metolazone day.  15 tablet  3   .  Multiple Vitamin (MULTIVITAMIN WITH MINERALS) TABS tablet  Take 1 tablet by mouth daily.     Marland Kitchen  omeprazole (PRILOSEC) 20 MG capsule  Take 20 mg by mouth daily.     .  Oxycodone HCl 10 MG TABS  Take 10 mg by mouth every 8 (eight) hours as needed (for pain).     .  Potassium Chloride ER 20 MEQ TBCR  Take 20-40 mEq by mouth daily. 1 TABLET DAILY, EXCEPT ON MONDAYS TAKE 2     .  senna (SENOKOT) 8.6 MG TABS tablet  Take 1 tablet by mouth 2 (two) times daily as needed for mild constipation.     Marland Kitchen  glucose blood (BAYER CONTOUR TEST) test strip  1 each by Other route  as needed. USE TO TEST BLOOD SUGAR TWICE DAILY      Home:  Home Living  Family/patient expects to be discharged to:: Private residence  Living Arrangements: Children;Other relatives  Available Help at Discharge: Family  Type of Home: House  Home Access: Stairs to enter  Technical brewer of Steps: 3  Entrance Stairs-Rails: Can reach both  Home Layout: One level   Home Equipment: Walker - 2 wheels;Grab bars - toilet;Grab bars - tub/shower  Functional History:  Prior Function  Level of Independence: Independent with assistive device(s)  Functional Status:  Mobility:  Bed Mobility  Overal bed mobility: Needs Assistance  Bed Mobility: Rolling;Supine to Sit  Rolling: Min assist  Supine to sit: Min assist  General bed mobility comments: did not do bed mobility, pt stayed in the chair  Transfers  Overall transfer level: Needs assistance  Equipment used: Rolling walker (2 wheeled)  Transfers: Sit to/from Stand  Sit to Stand: Mod assist;+2 safety/equipment  Stand pivot transfers: Mod assist  General transfer comment: cues for hand placement and to get pt positioned for symmetrical stand.  Ambulation/Gait  Ambulation/Gait assistance: Max assist  Ambulation Distance (Feet): 7 Feet (then additional 7 feet with RW and extra assist)  Assistive device: Rolling walker (2 wheeled)  Gait Pattern/deviations: Step-through pattern;Ataxic;Narrow base of support (with heavy lean L)  General Gait Details: With significant w/shift assist, pt able to amb about 7 feet before getting unsafe for pt and therapist. pt needs supporting in order to advance L foot then has trouble placing appropriately   ADL:  ADL  Overall ADL's : Needs assistance/impaired  Grooming: Minimal assistance;Sitting  Upper Body Bathing: Moderate assistance;Sitting  Lower Body Bathing: Total assistance;+2 for safety/equipment;Sit to/from stand  Upper Body Dressing : Minimal assistance;Sitting  Lower Body Dressing: Total assistance;+2 for safety/equipment;Sit to/from stand  Toilet Transfer: Moderate assistance;Stand-pivot;RW  Toileting- Clothing Manipulation and Hygiene: Total assistance;Sit to/from stand;+2 for safety/equipment  General ADL Comments: Patient limited by poor pulmonary/cardiovascular support and increased anxiety during session, Patient anxious with therapist assisting with bed  mobility and for stand pivot transfer EOB>recliner. Patient would say " you can't do this by yourself" and "make sure you put that belt around me". Patient's daughter present half-way thru eval & treat. Daughter with multiple questions that this therapist deferred to patient's RN. Patient performed stand pivot transfer with mod assist and max verbal cues for safety; patient with an increased posterior lean during transfer. Patient also with complaints of being dizzy and nauseous; RN aware of this.  Cognition:  Cognition  Overall Cognitive Status: Within Functional Limits for tasks assessed  Orientation Level: Oriented X4  Cognition  Arousal/Alertness: Awake/alert  Behavior During Therapy: Anxious  Overall Cognitive Status: Within Functional Limits for tasks assessed  Memory: Decreased short-term memory  Physical Exam:  Blood pressure 145/76, pulse 68, temperature 97.3 F (36.3 C), temperature source Oral, resp. rate 20, height _0  (1.702 m), weight 105.87 kg (233 lb 6.4 oz), SpO2 95.00%.  Physical Exam  Nursing note and vitals reviewed.  Constitutional: He is oriented to person, place, and time. He appears well-developed and well-nourished.  Morbidly obese  HENT:  Head: Normocephalic and atraumatic.  Eyes: Conjunctivae are normal. Pupils are equal, round, and reactive to light.  Neck: Normal range of motion. Neck supple.  Cardiovascular: Normal rate. An irregularly irregular rhythm present.  Respiratory: He has wheezes in the right middle field, the right lower field, the left middle field and the left lower field.  GI:  Soft. Bowel sounds are normal. He exhibits no distension.  Musculoskeletal: He exhibits edema and tenderness. 2+ pedal edema bilaterally. 2+ edema RLE>LLE. No pain with ROM bilateral elbows. No edema or erythema bilateral elbows. Right shoulder with RTC signs and substantial pain in ROM. Grip weak on right due to pain and deformities. Pain with R elbow AROM Neurological:  He is alert and oriented to person, place, and time.  Skin: Skin is warm and dry.  Psychiatric: He has a normal mood and affect. His behavior is anxious especially with mobility. Judgment and thought content normal.  Results for orders placed during the hospital encounter of 07/21/14 (from the past 48 hour(s))   TSH Status: Abnormal    Collection Time    07/22/14 11:16 AM   Result  Value  Ref Range    TSH  5.740 (*)  0.350 - 4.500 uIU/mL   SEDIMENTATION RATE Status: Abnormal    Collection Time    07/22/14 11:30 AM   Result  Value  Ref Range    Sed Rate  48 (*)  0 - 16 mm/hr   URIC ACID Status: Abnormal    Collection Time    07/22/14 11:30 AM   Result  Value  Ref Range    Uric Acid, Serum  11.1 (*)  4.0 - 7.8 mg/dL   RHEUMATOID FACTOR Status: None    Collection Time    07/22/14 11:30 AM   Result  Value  Ref Range    Rheumatoid Factor  <10  <=14 IU/mL    Comment:  (NOTE)     Interpretive Table     Low Positive: 15 - 41 IU/mL     High Positive: >= 42 IU/mL     In addition to the RF result, and clinical symptoms including joint     involvement, the 2010 ACR Classification Criteria for     scoring/diagnosing Rheumatoid Arthritis include the results of the     following tests: CRP (96438), ESR (15010), and CCP (APCA) (38184).     www.rheumatology.org/practice/clinical/classification/ra/ra_2010.asp     Performed at [facility]   ANA Status: None    Collection Time    07/22/14 11:30 AM   Result  Value  Ref Range    ANA  NEGATIVE  NEGATIVE    Comment:  Performed at [place], CAPILLARY Status: Abnormal    Collection Time    07/22/14 11:44 AM   Result  Value  Ref Range    Glucose-Capillary  159 (*)  70 - 99 mg/dL    Comment 1  Documented in Chart     Comment 2  Notify RN    GLUCOSE, CAPILLARY Status: Abnormal    Collection Time    07/22/14 5:06 PM   Result  Value  Ref Range    Glucose-Capillary  213 (*)  70 - 99 mg/dL    Comment 1  Documented in  Chart     Comment 2  Notify RN    GLUCOSE, CAPILLARY Status: Abnormal    Collection Time    07/22/14 9:00 PM   Result  Value  Ref Range    Glucose-Capillary  203 (*)  70 - 99 mg/dL    Comment 1  Notify RN    GLUCOSE, CAPILLARY Status: Abnormal    Collection Time    07/23/14 7:52 AM   Result  Value  Ref Range    Glucose-Capillary  159 (*)  70 - 99 mg/dL  Comment 1  Documented in Chart     Comment 2  Notify RN    GLUCOSE, CAPILLARY Status: Abnormal    Collection Time    07/23/14 11:55 AM   Result  Value  Ref Range    Glucose-Capillary  167 (*)  70 - 99 mg/dL    Comment 1  Documented in Chart     Comment 2  Notify RN    GLUCOSE, CAPILLARY Status: Abnormal    Collection Time    07/23/14 4:54 PM   Result  Value  Ref Range    Glucose-Capillary  179 (*)  70 - 99 mg/dL    Comment 1  Documented in Chart     Comment 2  Notify RN    GLUCOSE, CAPILLARY Status: Abnormal    Collection Time    07/23/14 9:06 PM   Result  Value  Ref Range    Glucose-Capillary  210 (*)  70 - 99 mg/dL   GLUCOSE, CAPILLARY Status: Abnormal    Collection Time    07/24/14 8:06 AM   Result  Value  Ref Range    Glucose-Capillary  127 (*)  70 - 99 mg/dL    Comment 1  Documented in Chart     Comment 2  Notify RN     No results found.  Medical Problem List and Plan:  1. Functional deficits secondary to Gout flare and severe diabetic peripheral neuropathy.  2. DVT Prophylaxis/Anticoagulation: Pharmaceutical: Lovenox--monitor platelets with history of thrombocytopenia.  3. Pain Management: continue colchicine and prednisone.  4. Mood: NO signs of ETOH withdrawal noted. CIWA protocol. Continue prozac. LCSW to follow for evaluation and support.  5. Neuropsych: This patient is capable of making decisions on his own behalf.  6. Skin/Wound Care: Routine pressure relief measures.  7. Fluids/Electrolytes/Nutrition: Monitor strict I/O. Offer nutritional supplements if po intake poor.  8. Chronic diastolic HF: Check  daily weights. Low salt diet. Continue lasix, coreg and lipitor. Oxygen at bedtime.  9. DM type 2 with polyneuropathy: Monitor BS with ac/hs checks. Continue lantus insulin with SS coverage as anticipate BS variation due to prednisone.  10. Alcohol abuse with gait disorder: Resume lactulose.  11. Metastatic breast cancer: Awaiting surgery.  12. Gout flare: Will taper prednisone as symptoms improving. On allopurinol in addition to colchicine.  13. Dizziness with diplopia: Has history of inner ear problems in the past. Will order vestibular rehab. Reports diplopia with near vision--question accomodation issues v/s neurologic changes. Question repeat CT head as also showing left lean with ambulation.  Post Admission Physician Evaluation:  1. Functional deficits secondary to Gout flare and severe diabetic peripheral neuropathy 2. Patient is admitted to receive collaborative, interdisciplinary care between the physiatrist, rehab nursing staff, and therapy team. 3. Patient's level of medical complexity and substantial therapy needs in context of that medical necessity cannot be provided at a lesser intensity of care such as a SNF. 4. Patient has experienced substantial functional loss from his/her baseline which was documented above under the "Functional History" and "Functional Status" headings. Judging by the patient's diagnosis, physical exam, and functional history, the patient has potential for functional progress which will result in measurable gains while on inpatient rehab. These gains will be of substantial and practical use upon discharge in facilitating mobility and self-care at the household level. 5. Physiatrist will provide 24 hour management of medical needs as well as oversight of the therapy plan/treatment and provide guidance as appropriate regarding the interaction of the two. 6.  24 hour rehab nursing will assist with bladder management, bowel management, safety, skin/wound care, disease  management, medication administration, pain management and patient education and help integrate therapy concepts, techniques,education, etc. 7. PT will assess and treat for/with: pre gait, gait training, endurance , safety, equipment, neuromuscular re education. Goals are: Sup/Mod I. 8. OT will assess and treat for/with: ADLs, Cognitive perceptual skills, Neuromuscular re education, safety, endurance, equipment. Goals are: Sup/Mod I. Therapy may proceed with showering this patient. 9. SLP will assess and treat for/with: NA. Goals are: NA. 10. Case Management and Social Worker will assess and treat for psychological issues and discharge planning. 11. Team conference will be held weekly to assess progress toward goals and to determine barriers to discharge. 12. Patient will receive at least 3 hours of therapy per day at least 5 days per week. 13. ELOS: 12-18days  14. Prognosis: good Charlett Blake M.D. [facility] FAAPM&R (Sports Med, Neuromuscular Med) Diplomate Am Board of Electrodiagnostic Med   07/24/2014

## 2014-07-26 NOTE — Progress Notes (Signed)
Recreational Therapy Session Note  Patient Details  Name: [name] MRN: [id number] Date of Birth: October 02, 1946 Today's Date: 07/26/2014  TR order received & chart reviewed.  Met with pt to discuss TR services & pt agreeable to participate.  Full eval to follow early next week. [place] 07/26/2014, 12:18 PM

## 2014-07-26 NOTE — Progress Notes (Signed)
Physical Therapy Session Note  Patient Details  Name: [name] MRN: [id number] Date of Birth: May 17, 1946  Today's Date: 07/26/2014 PT Individual Time: [id number] (Co-tx with rec therapist for initial 30 minutes) and 1445-1545 PT Individual Time Calculation (min): 60 min and 60 min  Short Term Goals: Week 1:  PT Short Term Goal 1 (Week 1): Pt will perform supine>sit with supervision with HOB flat without rail. PT Short Term Goal 2 (Week 1): Pt will transfer from bed<>chair with min A with 25% cueing for sequencing and technique. PT Short Term Goal 3 (Week 1): Pt will perform static standing x1 minute with supervision and no LOB. PT Short Term Goal 4 (Week 1): Pt will ambulate x100' in controlled environment with LRAD and Mod A of single therapist. PT Short Term Goal 5 (Week 1): Pt will perform w/c mobility x100' consecutively without rest breaks with supervision.  Skilled Therapeutic Interventions/Progress Updates:    Treatment Session 1: Co-treatment with rec therapist focusing on dynamic standing balance and functional transfers, ambulation, and stair negotiation. Pt received semi reclined in bed, asleep and slow to awaken. Donned Teds. Notified RN that SpO2 remained at 99% during all physical activity yesterday. Per pt request, removed supplemental O2 after being on 1 L/min via [place] for initial 50% of session. SpO2 remained >89% throughout.  See below for detailed description of NMR interventions performed with rec therapist. Transitioned to negotiation of 3 stairs with 2 rails with step-to pattern and mod A, min verbal cueing for sequencing, technique. Performed w/c mobility x75' in controlled environment with bilat LE's. Noted L foot drop during stair negotiation. Therefore, donned L AFO (Reaction) to address change in gait stability/independence. Pt performed gait x150' in controlled environment with bariatric rolling walker and min-mod A (to recover from posterior/left LOB x3). Session  ended in pt room, where pt was left seated in recliner with LE's elevated and all needs within reach.  Treatment Session 2: Pt received seated in recliner; agreeable to therapy. Session focused on increasing activity tolerance and providing pt education. Pt insistent on this therapist donning shoes, stating, "[name] (daughter) does this for me when I'm not doing well." Provided education on importance of attempting self care activities to maximize independence. Pt verbalized understanding; required mod A to don L shoe due to AFO.  Performed gait 2x175' in controlled environment with bariatric rolling walker, L AFO (Reaction) and min guard; single LOB to L side during turning with effective self-recovery. Pt demonstrates the following gait abnormalities: limited bilat dorsiflexion (moreso on L), L steppage, L Trendelenburg gait pattern, excessive lateral trunk displacement to L side during RLE advancement. Provided the following education in reference to skin breakdown: risk of prolonged wearing of AFO due to LE edema and impaired sensation, poor circulation/impaired healing with DM, signs/symptoms of pressure/skin breakdown. Recommended that pt remove AFO immediately after PT and inspect skin for signs of pressure or skin breakdown. Provided pt with skin inspection mirror. Pt verbalized understanding and gave effective return demonstration of skin breakdown. Departed with pt seated in recliner with bilat LE's elevated and all needs within reach.  Therapy Documentation Precautions:  Precautions Precautions: None Precaution Comments: Make sure Teds are on prior to standing/gait Restrictions Weight Bearing Restrictions: No Vital Signs: Therapy Vitals Temp: 98.3 F (36.8 C) Temp Source: Oral Pulse Rate: 58 Resp: 18 BP: 115/60 mmHg Patient Position (if appropriate): Sitting Oxygen Therapy SpO2: 93 % O2 Device: None (Room air) Pain: Pain Assessment Pain Assessment: 0-10 Pain Score: 0-No  pain Pain Location: Leg Pain Orientation: Left;Right Pain Descriptors / Indicators: Aching;Burning Patients Stated Pain Goal: 6 Pain Intervention(s): Medication (See eMAR) Locomotion : Ambulation Ambulation/Gait Assistance: 4: Min guard;4: Min assist  NMR: Neuromuscular Facilitation: Activity to increase motor control;Activity to increase sustained activation;Activity to increase lateral weight shifting;Activity to increase anterior-posterior weight shifting Pt performed multidirectional reaching for horseshoes with RUE followed by tossing horseshoes for postural control with dynamic standing, functional weight shifting; LUE support at rolling walker, min guard-mod A for stability.  See FIM for current functional status  Therapy/Group: Individual Therapy and Co-Treatment  Hobble, Blair A 07/26/2014, 5:08 PM

## 2014-07-26 NOTE — Progress Notes (Signed)
Subjective/Complaints: 68 y.o. male with history of OSA, HTN, CAF, ongoing alcohol abuse, chronic pain; who was admitted on 07/21/14 with fatigue, increased weakness and BLE edema. He was treated with IV lasix due to complaints of SOB and Dr. Haroldine Laws consulted for input. CT head with moderate generalized atrophy with progression since 2011. He felt that volume status was stable but patient with polyarticular gout flare in setting of ETOH abuse and added steroids for treatment. Uric acid 11.1 with TSH 5.74. X rays lumbar spine with extensive spondylosis and osteoarthritic change with diffuse idiopathic skeletal hyperostosis. Therapy evaluation done and patient with significant balance deficits with ataxia as well as difficulty with mobility due to gout flare  Some dizziness noted. Per PT, no evidence of vestibular dysfunction Patient complains of some nasal congestion and nosebleed which has resolved. Patient feels like his left ear is plugged  Review of systems is negative except as above. He does have bilateral foot drop noted by PT, worse on the left side Objective: Vital Signs: Blood pressure 115/60, pulse 58, temperature 98.3 F (36.8 C), temperature source Oral, resp. rate 18, height 5' 7.5" (1.715 m), weight 49.941 kg (110 lb 1.6 oz), SpO2 93.00%. No results found. Results for orders placed during the hospital encounter of 07/24/14 (from the past 72 hour(s))  GLUCOSE, CAPILLARY     Status: Abnormal   Collection Time    07/24/14  8:38 PM      Result Value Ref Range   Glucose-Capillary 198 (*) 70 - 99 mg/dL  CBC WITH DIFFERENTIAL     Status: Abnormal   Collection Time    07/25/14  5:00 AM      Result Value Ref Range   WBC 7.0  4.0 - 10.5 K/uL   RBC 4.16 (*) 4.22 - 5.81 MIL/uL   Hemoglobin 12.9 (*) 13.0 - 17.0 g/dL   HCT 39.7  39.0 - 52.0 %   MCV 95.4  78.0 - 100.0 fL   MCH 31.0  26.0 - 34.0 pg   MCHC 32.5  30.0 - 36.0 g/dL   RDW 15.7 (*) 11.5 - 15.5 %   Platelets 148 (*) 150  - 400 K/uL   Neutrophils Relative % 59  43 - 77 %   Neutro Abs 4.1  1.7 - 7.7 K/uL   Lymphocytes Relative 30  12 - 46 %   Lymphs Abs 2.1  0.7 - 4.0 K/uL   Monocytes Relative 10  3 - 12 %   Monocytes Absolute 0.7  0.1 - 1.0 K/uL   Eosinophils Relative 1  0 - 5 %   Eosinophils Absolute 0.1  0.0 - 0.7 K/uL   Basophils Relative 0  0 - 1 %   Basophils Absolute 0.0  0.0 - 0.1 K/uL  COMPREHENSIVE METABOLIC PANEL     Status: Abnormal   Collection Time    07/25/14  5:00 AM      Result Value Ref Range   Sodium 142  137 - 147 mEq/L   Potassium 3.6 (*) 3.7 - 5.3 mEq/L   Chloride 96  96 - 112 mEq/L   CO2 32  19 - 32 mEq/L   Glucose, Bld 111 (*) 70 - 99 mg/dL   BUN 50 (*) 6 - 23 mg/dL   Creatinine, Ser 0.94  0.50 - 1.35 mg/dL   Calcium 9.2  8.4 - 10.5 mg/dL   Total Protein 7.3  6.0 - 8.3 g/dL   Albumin 3.1 (*) 3.5 - 5.2 g/dL  AST 17  0 - 37 U/L   ALT 10  0 - 53 U/L   Alkaline Phosphatase 59  39 - 117 U/L   Total Bilirubin 0.6  0.3 - 1.2 mg/dL   GFR calc non Af Amer 83 (*) >90 mL/min   GFR calc Af Amer >90  >90 mL/min   Comment: (NOTE)     The eGFR has been calculated using the CKD EPI equation.     This calculation has not been validated in all clinical situations.     eGFR's persistently <90 mL/min signify possible Chronic Kidney     Disease.   Anion gap 14  5 - 15  AMMONIA     Status: None   Collection Time    07/25/14  5:00 AM      Result Value Ref Range   Ammonia 48  11 - 60 umol/L  GLUCOSE, CAPILLARY     Status: None   Collection Time    07/25/14  6:46 AM      Result Value Ref Range   Glucose-Capillary 99  70 - 99 mg/dL  GLUCOSE, CAPILLARY     Status: Abnormal   Collection Time    07/25/14 11:19 AM      Result Value Ref Range   Glucose-Capillary 108 (*) 70 - 99 mg/dL  GLUCOSE, CAPILLARY     Status: Abnormal   Collection Time    07/25/14  4:26 PM      Result Value Ref Range   Glucose-Capillary 200 (*) 70 - 99 mg/dL  GLUCOSE, CAPILLARY     Status: Abnormal   Collection  Time    07/25/14  9:24 PM      Result Value Ref Range   Glucose-Capillary 254 (*) 70 - 99 mg/dL  BASIC METABOLIC PANEL     Status: Abnormal   Collection Time    07/26/14  5:25 AM      Result Value Ref Range   Sodium 136 (*) 137 - 147 mEq/L   Potassium 3.8  3.7 - 5.3 mEq/L   Chloride 93 (*) 96 - 112 mEq/L   CO2 33 (*) 19 - 32 mEq/L   Glucose, Bld 160 (*) 70 - 99 mg/dL   BUN 49 (*) 6 - 23 mg/dL   Creatinine, Ser 0.92  0.50 - 1.35 mg/dL   Calcium 9.4  8.4 - 10.5 mg/dL   GFR calc non Af Amer 83 (*) >90 mL/min   GFR calc Af Amer >90  >90 mL/min   Comment: (NOTE)     The eGFR has been calculated using the CKD EPI equation.     This calculation has not been validated in all clinical situations.     eGFR's persistently <90 mL/min signify possible Chronic Kidney     Disease.   Anion gap 10  5 - 15  GLUCOSE, CAPILLARY     Status: Abnormal   Collection Time    07/26/14  7:12 AM      Result Value Ref Range   Glucose-Capillary 151 (*) 70 - 99 mg/dL  GLUCOSE, CAPILLARY     Status: Abnormal   Collection Time    07/26/14 11:16 AM      Result Value Ref Range   Glucose-Capillary 122 (*) 70 - 99 mg/dL     HEENT: No evidence of blood around the nares. External ears clear Cardio: RRR and No murmur Resp: CTA B/L and Unlabored GI: BS positive and Nontender nondistended Extremity:  No Edema  Neuro: Alert/Oriented,  Abnormal Sensory Reduced below the knee bilateral, reduced fingertips and Abnormal Motor Upper extremity strength 4/5 deltoid, biceps, triceps and gripLower extremity strength 4/5 hip flexor knee extensor 2 minus left ankle dorsiflexor 3 minus right ankle  Musc/Skel:  Other No pain with upper extremity or lower shimmery range of motion, no evidence of joint swelling Gen. no acute distress   Assessment/Plan: 1. Functional deficits secondary to Gout flare and severe diabetic neuropathy which require 3+ hours per day of interdisciplinary therapy in a comprehensive inpatient rehab  setting. Physiatrist is providing close team supervision and 24 hour management of active medical problems listed below. Physiatrist and rehab team continue to assess barriers to discharge/monitor patient progress toward functional and medical goals. FIM: FIM - Bathing Bathing Steps Patient Completed: Chest;Right Arm;Left Arm;Abdomen Bathing: 2: Max-Patient completes 3-4 [id number] parts or 25-49%  FIM - Upper Body Dressing/Undressing Upper body dressing/undressing steps patient completed: Thread/unthread right sleeve of pullover shirt/dresss;Thread/unthread left sleeve of pullover shirt/dress;Put head through opening of pull over shirt/dress Upper body dressing/undressing: 4: Min-Patient completed 75 plus % of tasks FIM - Lower Body Dressing/Undressing Lower body dressing/undressing: 0: Wears gown/pajamas-no public clothing  FIM - TMusicianDevices: Grab bar or rail for support  FIM - TRadio producerDevices: Elevated toilet seat;Grab bars Toilet Transfers: 3-To toilet/BSC: Mod A (lift or lower assist);3-From toilet/BSC: Mod A (lift or lower assist)  FIM - Bed/Chair Transfer Bed/Chair Transfer Assistive Devices: Arm rests;Walker Bed/Chair Transfer: 4: Chair or W/C > Bed: Min A (steadying Pt. > 75%);4: Sit > Supine: Min A (steadying pt. > 75%/lift 1 leg)  FIM - Locomotion: Wheelchair Distance: 35 Locomotion: Wheelchair: 1: Total Assistance/staff pushes wheelchair (Pt<25%) FIM - Locomotion: Ambulation Locomotion: Ambulation Assistive Devices: Orthosis;Other (comment);Walker - Rolling (L AFO (Reaction)) Ambulation/Gait Assistance: 1: +2 Total assist;4: Min assist;Other (comment) (+2A for w/c follow) Locomotion: Ambulation: 1: Two helpers  Comprehension Comprehension Mode: Auditory Comprehension: 5-Understands basic 90% of the time/requires cueing < 10% of the time  Expression Expression Mode: Verbal Expression: 5-Expresses basic 90% of  the time/requires cueing < 10% of the time.  Social Interaction Social Interaction: 2-Interacts appropriately 25 - 49% of time - Needs frequent redirection.  Problem Solving Problem Solving: 4-Solves basic 75 - 89% of the time/requires cueing 10 - 24% of the time  Memory Memory: 5-Recognizes or recalls 90% of the time/requires cueing < 10% of the time  Medical Problem List and Plan:  1. Functional deficits secondary to Gout flare and severe diabetic peripheral neuropathy.  2. DVT Prophylaxis/Anticoagulation: Pharmaceutical: Lovenox--monitor platelets with history of thrombocytopenia.  3. Pain Management: continue colchicine and prednisone.  4. Mood: NO signs of ETOH withdrawal noted. CIWA protocol. Continue prozac. LCSW to follow for evaluation and support.  5. Neuropsych: This patient is capable of making decisions on his own behalf.  6. Skin/Wound Care: Routine pressure relief measures.  7. Fluids/Electrolytes/Nutrition: Monitor strict I/O. Offer nutritional supplements if po intake poor.  8. Chronic diastolic HF: Check daily weights. Low salt diet. Continue lasix, coreg and lipitor. Oxygen at bedtime.  9. DM type 2 with polyneuropathy: Monitor BS with ac/hs checks. Continue lantus insulin with SS coverage as anticipate BS variation due to prednisone.  10. Alcohol abuse with gait disorder: Resume lactulose.  11. Metastatic breast cancer: Awaiting surgery.  12. Gout flare: Will taper prednisone as symptoms improving. On allopurinol in addition to colchicine.  13. Dizziness with diplopia: Has history of inner ear problems in the past. Will order  vestibular rehab. Reports diplopia with near vision--question accomodation issues v/s neurologic changes. Question repeat CT head as also showing left lean with ambulation.    LOS (Days) 2 A FACE TO FACE EVALUATION WAS PERFORMED  KIRSTEINS,ANDREW E 07/26/2014, 2:26 PM

## 2014-07-27 ENCOUNTER — Inpatient Hospital Stay (HOSPITAL_COMMUNITY): Payer: Medicare Other | Admitting: Physical Therapy

## 2014-07-27 ENCOUNTER — Inpatient Hospital Stay (HOSPITAL_COMMUNITY): Payer: Medicare Other | Admitting: *Deleted

## 2014-07-27 DIAGNOSIS — I5032 Chronic diastolic (congestive) heart failure: Secondary | ICD-10-CM

## 2014-07-27 DIAGNOSIS — E1142 Type 2 diabetes mellitus with diabetic polyneuropathy: Secondary | ICD-10-CM

## 2014-07-27 LAB — CBC WITH DIFFERENTIAL/PLATELET
Basophils Absolute: 0 10*3/uL (ref 0.0–0.1)
Basophils Relative: 0 % (ref 0–1)
Eosinophils Absolute: 0.1 10*3/uL (ref 0.0–0.7)
Eosinophils Relative: 2 % (ref 0–5)
HEMATOCRIT: 37.5 % — AB (ref 39.0–52.0)
HEMOGLOBIN: 12.2 g/dL — AB (ref 13.0–17.0)
LYMPHS PCT: 21 % (ref 12–46)
Lymphs Abs: 1.4 10*3/uL (ref 0.7–4.0)
MCH: 30.9 pg (ref 26.0–34.0)
MCHC: 32.5 g/dL (ref 30.0–36.0)
MCV: 94.9 fL (ref 78.0–100.0)
MONO ABS: 0.8 10*3/uL (ref 0.1–1.0)
MONOS PCT: 11 % (ref 3–12)
NEUTROS ABS: 4.4 10*3/uL (ref 1.7–7.7)
Neutrophils Relative %: 66 % (ref 43–77)
Platelets: 137 10*3/uL — ABNORMAL LOW (ref 150–400)
RBC: 3.95 MIL/uL — ABNORMAL LOW (ref 4.22–5.81)
RDW: 15.3 % (ref 11.5–15.5)
WBC: 6.7 10*3/uL (ref 4.0–10.5)

## 2014-07-27 LAB — GLUCOSE, CAPILLARY
GLUCOSE-CAPILLARY: 205 mg/dL — AB (ref 70–99)
Glucose-Capillary: 147 mg/dL — ABNORMAL HIGH (ref 70–99)
Glucose-Capillary: 179 mg/dL — ABNORMAL HIGH (ref 70–99)
Glucose-Capillary: 149 mg/dL — ABNORMAL HIGH (ref 70–99)

## 2014-07-27 LAB — BASIC METABOLIC PANEL
Anion gap: 11 (ref 5–15)
BUN: 45 mg/dL — ABNORMAL HIGH (ref 6–23)
CALCIUM: 9.2 mg/dL (ref 8.4–10.5)
CO2: 33 meq/L — AB (ref 19–32)
Chloride: 93 mEq/L — ABNORMAL LOW (ref 96–112)
Creatinine, Ser: 0.92 mg/dL (ref 0.50–1.35)
GFR calc Af Amer: >90 mL/min (ref >90)
GFR calc non Af Amer: 83 mL/min — ABNORMAL LOW (ref >90)
GLUCOSE: 156 mg/dL — AB (ref 70–99)
Potassium: 3.7 mEq/L (ref 3.7–5.3)
Sodium: 137 mEq/L (ref 137–147)

## 2014-07-27 MED ORDER — FUROSEMIDE 80 MG PO TABS
160.0000 mg | ORAL_TABLET | ORAL | Status: DC
  Administered 2014-07-27 – 2014-08-02 (×12): 160 mg via ORAL
  Filled 2014-07-27 (×14): qty 2

## 2014-07-27 NOTE — Progress Notes (Signed)
Subjective/Complaints: 68 y.o. male with history of OSA, HTN, CAF, ongoing alcohol abuse, chronic pain; who was admitted on 07/21/14 with fatigue, increased weakness and BLE edema. He was treated with IV lasix due to complaints of SOB and Dr. Haroldine Laws consulted for input. CT head with moderate generalized atrophy with progression since 2011. He felt that volume status was stable but patient with polyarticular gout flare in setting of ETOH abuse and added steroids for treatment. Uric acid 11.1 with TSH 5.74. X rays lumbar spine with extensive spondylosis and osteoarthritic change with diffuse idiopathic skeletal hyperostosis. Therapy evaluation done and patient with significant balance deficits with ataxia as well as difficulty with mobility due to gout flare  Somnolent this morning for physical therapy. He does awaken to voice briefly. He was able to take his medicines for his nurse this morning by mouth   Review of systems is negative except as above. He does have bilateral foot drop noted worse on the left side Objective: Vital Signs: Blood pressure 156/80, pulse 86, temperature 97.7 F (36.5 C), temperature source Oral, resp. rate 17, height 5' 7.5" (1.715 m), weight 109.6 kg (241 lb 10 oz), SpO2 96.00%. No results found. Results for orders placed during the hospital encounter of 07/24/14 (from the past 72 hour(s))  GLUCOSE, CAPILLARY     Status: Abnormal   Collection Time    07/24/14  8:38 PM      Result Value Ref Range   Glucose-Capillary 198 (*) 70 - 99 mg/dL  CBC WITH DIFFERENTIAL     Status: Abnormal   Collection Time    07/25/14  5:00 AM      Result Value Ref Range   WBC 7.0  4.0 - 10.5 K/uL   RBC 4.16 (*) 4.22 - 5.81 MIL/uL   Hemoglobin 12.9 (*) 13.0 - 17.0 g/dL   HCT 39.7  39.0 - 52.0 %   MCV 95.4  78.0 - 100.0 fL   MCH 31.0  26.0 - 34.0 pg   MCHC 32.5  30.0 - 36.0 g/dL   RDW 15.7 (*) 11.5 - 15.5 %   Platelets 148 (*) 150 - 400 K/uL   Neutrophils Relative % 59  43 - 77  %   Neutro Abs 4.1  1.7 - 7.7 K/uL   Lymphocytes Relative 30  12 - 46 %   Lymphs Abs 2.1  0.7 - 4.0 K/uL   Monocytes Relative 10  3 - 12 %   Monocytes Absolute 0.7  0.1 - 1.0 K/uL   Eosinophils Relative 1  0 - 5 %   Eosinophils Absolute 0.1  0.0 - 0.7 K/uL   Basophils Relative 0  0 - 1 %   Basophils Absolute 0.0  0.0 - 0.1 K/uL  COMPREHENSIVE METABOLIC PANEL     Status: Abnormal   Collection Time    07/25/14  5:00 AM      Result Value Ref Range   Sodium 142  137 - 147 mEq/L   Potassium 3.6 (*) 3.7 - 5.3 mEq/L   Chloride 96  96 - 112 mEq/L   CO2 32  19 - 32 mEq/L   Glucose, Bld 111 (*) 70 - 99 mg/dL   BUN 50 (*) 6 - 23 mg/dL   Creatinine, Ser 0.94  0.50 - 1.35 mg/dL   Calcium 9.2  8.4 - 10.5 mg/dL   Total Protein 7.3  6.0 - 8.3 g/dL   Albumin 3.1 (*) 3.5 - 5.2 g/dL   AST 17  0 -  37 U/L   ALT 10  0 - 53 U/L   Alkaline Phosphatase 59  39 - 117 U/L   Total Bilirubin 0.6  0.3 - 1.2 mg/dL   GFR calc non Af Amer 83 (*) >90 mL/min   GFR calc Af Amer >90  >90 mL/min   Comment: (NOTE)     The eGFR has been calculated using the CKD EPI equation.     This calculation has not been validated in all clinical situations.     eGFR's persistently <90 mL/min signify possible Chronic Kidney     Disease.   Anion gap 14  5 - 15  AMMONIA     Status: None   Collection Time    07/25/14  5:00 AM      Result Value Ref Range   Ammonia 48  11 - 60 umol/L  GLUCOSE, CAPILLARY     Status: None   Collection Time    07/25/14  6:46 AM      Result Value Ref Range   Glucose-Capillary 99  70 - 99 mg/dL  GLUCOSE, CAPILLARY     Status: Abnormal   Collection Time    07/25/14 11:19 AM      Result Value Ref Range   Glucose-Capillary 108 (*) 70 - 99 mg/dL  GLUCOSE, CAPILLARY     Status: Abnormal   Collection Time    07/25/14  4:26 PM      Result Value Ref Range   Glucose-Capillary 200 (*) 70 - 99 mg/dL  GLUCOSE, CAPILLARY     Status: Abnormal   Collection Time    07/25/14  9:24 PM      Result Value  Ref Range   Glucose-Capillary 254 (*) 70 - 99 mg/dL  BASIC METABOLIC PANEL     Status: Abnormal   Collection Time    07/26/14  5:25 AM      Result Value Ref Range   Sodium 136 (*) 137 - 147 mEq/L   Potassium 3.8  3.7 - 5.3 mEq/L   Chloride 93 (*) 96 - 112 mEq/L   CO2 33 (*) 19 - 32 mEq/L   Glucose, Bld 160 (*) 70 - 99 mg/dL   BUN 49 (*) 6 - 23 mg/dL   Creatinine, Ser 0.92  0.50 - 1.35 mg/dL   Calcium 9.4  8.4 - 10.5 mg/dL   GFR calc non Af Amer 83 (*) >90 mL/min   GFR calc Af Amer >90  >90 mL/min   Comment: (NOTE)     The eGFR has been calculated using the CKD EPI equation.     This calculation has not been validated in all clinical situations.     eGFR's persistently <90 mL/min signify possible Chronic Kidney     Disease.   Anion gap 10  5 - 15  GLUCOSE, CAPILLARY     Status: Abnormal   Collection Time    07/26/14  7:12 AM      Result Value Ref Range   Glucose-Capillary 151 (*) 70 - 99 mg/dL  GLUCOSE, CAPILLARY     Status: Abnormal   Collection Time    07/26/14 11:16 AM      Result Value Ref Range   Glucose-Capillary 122 (*) 70 - 99 mg/dL  GLUCOSE, CAPILLARY     Status: Abnormal   Collection Time    07/26/14  4:50 PM      Result Value Ref Range   Glucose-Capillary 197 (*) 70 - 99 mg/dL  GLUCOSE, CAPILLARY  Status: Abnormal   Collection Time    07/26/14  8:50 PM      Result Value Ref Range   Glucose-Capillary 217 (*) 70 - 99 mg/dL   Comment 1 Notify RN    BASIC METABOLIC PANEL     Status: Abnormal   Collection Time    07/27/14  5:05 AM      Result Value Ref Range   Sodium 137  137 - 147 mEq/L   Potassium 3.7  3.7 - 5.3 mEq/L   Chloride 93 (*) 96 - 112 mEq/L   CO2 33 (*) 19 - 32 mEq/L   Glucose, Bld 156 (*) 70 - 99 mg/dL   BUN 45 (*) 6 - 23 mg/dL   Creatinine, Ser 0.92  0.50 - 1.35 mg/dL   Calcium 9.2  8.4 - 10.5 mg/dL   GFR calc non Af Amer 83 (*) >90 mL/min   GFR calc Af Amer >90  >90 mL/min   Comment: (NOTE)     The eGFR has been calculated using the CKD  EPI equation.     This calculation has not been validated in all clinical situations.     eGFR's persistently <90 mL/min signify possible Chronic Kidney     Disease.   Anion gap 11  5 - 15  GLUCOSE, CAPILLARY     Status: Abnormal   Collection Time    07/27/14  7:02 AM      Result Value Ref Range   Glucose-Capillary 147 (*) 70 - 99 mg/dL   Comment 1 Notify RN       HEENT: No evidence of blood around the nares. External ears clear Cardio: RRR and No murmur Resp: CTA B/L and Unlabored GI: BS positive and Nontender nondistended Extremity:  No Edema  Neuro: Lethargic but awakens to voice. Musc/Skel:  Other No pain with upper extremity or lower extremity range of motion, no evidence of joint swelling Gen. no acute distress   Assessment/Plan: 1. Functional deficits secondary to Gout flare and severe diabetic neuropathy which require 3+ hours per day of interdisciplinary therapy in a comprehensive inpatient rehab setting. Physiatrist is providing close team supervision and 24 hour management of active medical problems listed below. Physiatrist and rehab team continue to assess barriers to discharge/monitor patient progress toward functional and medical goals. FIM: FIM - Bathing Bathing Steps Patient Completed: Chest;Right Arm;Left Arm;Abdomen Bathing: 2: Max-Patient completes 3-4 72f10 parts or 25-49%  FIM - Upper Body Dressing/Undressing Upper body dressing/undressing steps patient completed: Thread/unthread right sleeve of pullover shirt/dresss;Thread/unthread left sleeve of pullover shirt/dress;Put head through opening of pull over shirt/dress Upper body dressing/undressing: 4: Min-Patient completed 75 plus % of tasks FIM - Lower Body Dressing/Undressing Lower body dressing/undressing steps patient completed: Pull pants up/down Lower body dressing/undressing: 1: Total-Patient completed less than 25% of tasks  FIM - Toileting Toileting steps completed by patient: Adjust clothing  after toileting Toileting Assistive Devices: Grab bar or rail for support Toileting: 2: Max-Patient completed 1 of 3 steps  FIM - TRadio producerDevices: Elevated toilet seat;Grab bars Toilet Transfers: 3-To toilet/BSC: Mod A (lift or lower assist);3-From toilet/BSC: Mod A (lift or lower assist)  FIM - Bed/Chair Transfer Bed/Chair Transfer Assistive Devices: Arm rests;Walker Bed/Chair Transfer: 4: Chair or W/C > Bed: Min A (steadying Pt. > 75%);4: Bed > Chair or W/C: Min A (steadying Pt. > 75%)  FIM - Locomotion: Wheelchair Distance: 75 Locomotion: Wheelchair: 2: Travels 50 - 149 ft with supervision, cueing or coaxing  FIM - Locomotion: Ambulation Locomotion: Ambulation Assistive Devices: Orthosis;Other (comment);Walker - Rolling Ambulation/Gait Assistance: 4: Min guard;4: Min assist Locomotion: Ambulation: 4: Travels 150 ft or more with minimal assistance (Pt.>75%)  Comprehension Comprehension Mode: Auditory Comprehension: 5-Understands basic 90% of the time/requires cueing < 10% of the time  Expression Expression Mode: Verbal Expression: 5-Expresses basic 90% of the time/requires cueing < 10% of the time.  Social Interaction Social Interaction: 2-Interacts appropriately 25 - 49% of time - Needs frequent redirection.  Problem Solving Problem Solving: 4-Solves basic 75 - 89% of the time/requires cueing 10 - 24% of the time  Memory Memory: 5-Recognizes or recalls 90% of the time/requires cueing < 10% of the time  Medical Problem List and Plan:  1. Functional deficits secondary to Gout flare and severe diabetic peripheral neuropathy.  2. DVT Prophylaxis/Anticoagulation: Pharmaceutical: Lovenox--monitor platelets with history of thrombocytopenia.  3. Pain Management: continue colchicine and prednisone.  4. Mood: NO signs of ETOH withdrawal noted. CIWA protocol. Continue prozac. LCSW to follow for evaluation and support.  5. Neuropsych: This patient  is capable of making decisions on his own behalf.  6. Skin/Wound Care: Routine pressure relief measures.  7. Fluids/Electrolytes/Nutrition: Monitor strict I/O. Offer nutritional supplements if po intake poor.  8. Chronic diastolic HF: Check daily weights. Low salt diet. Continue lasix, coreg and lipitor. Oxygen at bedtime.  9. DM type 2 with polyneuropathy: Monitor BS with ac/hs checks. Continue lantus insulin with SS coverage as anticipate BS variation due to prednisone.  10. Alcohol abuse with gait disorder: Resume lactulose.  11. Metastatic breast cancer: Awaiting surgery.  12. Gout flare: Will taper prednisone as symptoms improving. On allopurinol in addition to colchicine.  13. Dizziness with diplopia: Has history of inner ear problems in the past. Will order vestibular rehab. Reports diplopia with near vision--question accomodation issues v/s neurologic changes. Question repeat CT head as also showing left lean with ambulation.  14. Lethargy likely medication related. Receive trazodone for the first time last night, maybe in combination with pain medication, continue to monitor her, DC trazodone LOS (Days) 3 A FACE TO FACE EVALUATION WAS PERFORMED  KIRSTEINS,ANDREW E 07/27/2014, 10:38 AM

## 2014-07-27 NOTE — Progress Notes (Addendum)
Physical Therapy Session Note  Patient Details  Name: [name] MRN: [id number] Date of Birth: 28-Jan-1946  Today's Date: 07/27/2014 PT Individual Time: 1415-1500, 1100-1200 PT Individual Time Calculation (min): 45 min , 60 min  Short Term Goals: Week 1:  PT Short Term Goal 1 (Week 1): Pt will perform supine>sit with supervision with HOB flat without rail. PT Short Term Goal 2 (Week 1): Pt will transfer from bed<>chair with min A with 25% cueing for sequencing and technique. PT Short Term Goal 3 (Week 1): Pt will perform static standing x1 minute with supervision and no LOB. PT Short Term Goal 4 (Week 1): Pt will ambulate x100' in controlled environment with LRAD and Mod A of single therapist. PT Short Term Goal 5 (Week 1): Pt will perform w/c mobility x100' consecutively without rest breaks with supervision.  Skilled Therapeutic Interventions/Progress Updates:    Pt initially limited by fatigue in am session, but more amenable after education for OOB mobility. Pt with skin WFL after prolonged brace use today with pt able to initiate skin check. Pt also benefits from decreased fatigue noted in pm session increasing gait tolerance. Pt would continue to benefit from skilled PT services to increase functional mobility.   Therapy Documentation Precautions:  Precautions Precautions: None Precaution Comments: Make sure Teds are on prior to standing/gait, check skin after orthosis use tolerated 4 hours today Restrictions Weight Bearing Restrictions: No Pain: Pain Assessment Pain Assessment: No/denies pain Pain Score: 0-No pain Mobility:  Pt Min A for bed mobility and transfers with cues for weight shift and LE placement Locomotion : Ambulation Ambulation/Gait Assistance: 4: Min guard;4: Min assist (200'x2 in pm, 200'x1 in am) with cues for pacing, posture, and AD Balance: Static Sitting Balance Static Sitting - Balance Support: Feet supported;No upper extremity supported Static  Sitting - Level of Assistance: 5: Stand by assistance Dynamic Sitting Balance Dynamic Sitting - Balance Support: No upper extremity supported;Feet supported Dynamic Sitting - Level of Assistance: 5: Stand by assistance Static Standing Balance Static Standing - Balance Support: Right upper extremity supported Static Standing - Level of Assistance: 4: Min assist Dynamic Standing Balance Dynamic Standing - Balance Support: Bilateral upper extremity supported Dynamic Standing - Level of Assistance: 4: Min assist Dynamic Standing - Balance Activities: Reaching for objects Other Treatments:  TX 1: Pt performs transfer training x5 in session. LAQs, marching, hip abd/add isometrics, HS isometrics, and weight shifts performed 2x10. Pt educated extensively on importance of OOB activities and rehab plan. Pt in [place] test position 2x3'. Pt performs sitting balance as above.  TX 2: Pt performs manually resisted hip ext, ankle pumps, and anterior weight shifts 2x10. Pt educated on rehab plan and safety awareness. Pt requires increased rest between gait activities. Skin check following orthotic use performed with pt educated on skin checks.  See FIM for current functional status  Therapy/Group: Individual Therapy  Monia Pouch 07/27/2014, 3:27 PM

## 2014-07-27 NOTE — Progress Notes (Signed)
Occupational Therapy Session Note  Patient Details  Name: [name] MRN: [id number] Date of Birth: 10/15/1945  Today's Date: 07/27/2014 OT Individual Time: 0930-1015  1st session:   OT Individual Time Calculation (min): 45 min    Short Term Goals: Week 1:  OT Short Term Goal 1 (Week 1): Pt will engage in 10 minutes of functional task without requiring rest break in order to increase endurance for functional tasks.  OT Short Term Goal 2 (Week 1): Pt will perform LB dressing with Mod A in order to increase I in self care. OT Short Term Goal 3 (Week 1): Pt will perform toileting with Mod A in order to increase I in self care. OT Short Term Goal 4 (Week 1): Pt will perform shower transfer with Min A in order to increase I in functional transfer.  OT Short Term Goal 5 (Week 1): Pt will perform toilet transfer with Min A in order to increase I in functional transfers. :     Skilled Therapeutic Interventions/Progress Updates:      1st session:  Pt. Lying in bed upon OT arrival.  He was easily aroused.  He reported his pain was 10/10 in his legs and he did not want to get up or get ready.   Provided AAROM, AROM to BLE for pain relief and applied TEDs.  Engaged in [place] AROM exercises as well.  Pt agreed to go to wc.  Pt transferred to wc and to toilet with minimal assist.  Pt. Propelled wc to bathroom with assistance over thresholds.  Pt completed toileting and transferred back to wc and to bed per his request.  Left with all needs in reach.    Therapy Documentation Precautions:  Precautions Precautions: None Precaution Comments: Make sure Teds are on prior to standing/gait, check skin after orthosis use tolerated 4 hours today Restrictions Weight Bearing Restrictions: No    Vital Signs: Therapy Vitals Temp: 97.7 F (36.5 C) Temp Source: Oral Pulse Rate: 61 Resp: 18 BP: 137/60 mmHg Patient Position (if appropriate): Sitting Oxygen Therapy SpO2: 99 % O2 Device: None (Room air) O2  Flow Rate (L/min): 2 L/min Pain: Pain Assessment Pain Assessment:10/10  LE 1st session   Other Treatments:    2nd session:  Time:1600-1630  (30 min) Pain:  4/10  LE Individual session:  Pt. Sitting up in wc. He agreed to therapy.  Assisted pt with donning left shoe and brace.  Checked for pressure areas on leg.  There was no obvious pressure being caused by the brace.  Pt. Ambulated to nursing station and back to room.  Assist pt with shoes and wc set up.  Left pt in wc with safety belt on and call bell,phone within reach.      See FIM for current functional status  Therapy/Group: Individual Therapy  Lisa Roca 07/27/2014, 6:12 PM

## 2014-07-28 ENCOUNTER — Inpatient Hospital Stay (HOSPITAL_COMMUNITY): Payer: Medicare Other

## 2014-07-28 DIAGNOSIS — J302 Other seasonal allergic rhinitis: Secondary | ICD-10-CM

## 2014-07-28 DIAGNOSIS — B370 Candidal stomatitis: Secondary | ICD-10-CM

## 2014-07-28 LAB — GLUCOSE, CAPILLARY
GLUCOSE-CAPILLARY: 124 mg/dL — AB (ref 70–99)
GLUCOSE-CAPILLARY: 141 mg/dL — AB (ref 70–99)
GLUCOSE-CAPILLARY: 203 mg/dL — AB (ref 70–99)
Glucose-Capillary: 145 mg/dL — ABNORMAL HIGH (ref 70–99)

## 2014-07-28 LAB — BASIC METABOLIC PANEL
Anion gap: 11 (ref 5–15)
BUN: 46 mg/dL — ABNORMAL HIGH (ref 6–23)
CO2: 33 meq/L — AB (ref 19–32)
Calcium: 9.4 mg/dL (ref 8.4–10.5)
Chloride: 94 mEq/L — ABNORMAL LOW (ref 96–112)
Creatinine, Ser: 0.93 mg/dL (ref 0.50–1.35)
GFR calc Af Amer: >90 mL/min (ref >90)
GFR calc non Af Amer: 83 mL/min — ABNORMAL LOW (ref >90)
GLUCOSE: 125 mg/dL — AB (ref 70–99)
Potassium: 3.4 mEq/L — ABNORMAL LOW (ref 3.7–5.3)
SODIUM: 138 meq/L (ref 137–147)

## 2014-07-28 MED ORDER — CARBAMIDE PEROXIDE 6.5 % OT SOLN
5.0000 [drp] | Freq: Two times a day (BID) | OTIC | Status: DC
  Administered 2014-07-28 – 2014-08-02 (×11): 5 [drp] via OTIC
  Filled 2014-07-28: qty 15

## 2014-07-28 MED ORDER — NAPHAZOLINE HCL 0.1 % OP SOLN
1.0000 [drp] | Freq: Four times a day (QID) | OPHTHALMIC | Status: DC | PRN
  Administered 2014-07-28 – 2014-07-31 (×5): 1 [drp] via OPHTHALMIC
  Filled 2014-07-28: qty 15

## 2014-07-28 MED ORDER — FLUCONAZOLE 100 MG PO TABS
100.0000 mg | ORAL_TABLET | Freq: Every day | ORAL | Status: DC
  Administered 2014-07-28 – 2014-08-02 (×6): 100 mg via ORAL
  Filled 2014-07-28 (×7): qty 1

## 2014-07-28 NOTE — Progress Notes (Addendum)
Physical Therapy Session Note  Patient Details  Name: [name] MRN: [id number] Date of Birth: 12-Apr-1946  Today's Date: 07/28/2014 PT Individual Time: 1500-1600 PT Individual Time Calculation (min): 60 min   Short Term Goals: Week 1:  PT Short Term Goal 1 (Week 1): Pt will perform supine>sit with supervision with HOB flat without rail. PT Short Term Goal 2 (Week 1): Pt will transfer from bed<>chair with min A with 25% cueing for sequencing and technique. PT Short Term Goal 3 (Week 1): Pt will perform static standing x1 minute with supervision and no LOB. PT Short Term Goal 4 (Week 1): Pt will ambulate x100' in controlled environment with LRAD and Mod A of single therapist. PT Short Term Goal 5 (Week 1): Pt will perform w/c mobility x100' consecutively without rest breaks with supervision.  Skilled Therapeutic Interventions/Progress Updates:    Pt demonstrates improvement in session with increased ability to negotiate stairs. Pt most limited by decreased BLE strength, benefiting most from progressive increase of weight bearing activities. Pt reports subjective improvement post pain medicine and  manual techniques, particularly with R iliosacral upslip correction. Pt educated on conservative pain management options on outpatient basis if pain continues to limit him later in rehabilitation continuum of care.  Therapy Documentation Precautions:  Precautions Precautions: None Precaution Comments: Make sure Teds are on prior to standing/gait, check skin after orthosis use tolerated 4 hours today Restrictions Weight Bearing Restrictions: No Vital Signs: Therapy Vitals Temp: 97.8 F (36.6 C) Temp Source: Oral Pulse Rate: 74 Resp: 22 BP: 133/63 mmHg Patient Position (if appropriate): Sitting Oxygen Therapy SpO2: 95 % O2 Device: None (Room air) Pulse 101 SpO2 91% s/p stairs and ambulation Pain: Pain Assessment Pain Assessment: 0-10 Pain Score: 4  Pain Type: Acute pain Pain  Location: Knee Pain Orientation: Left;Right Pain Descriptors / Indicators: Aching Pain Intervention(s):  (manual and graded activity, RN gave meds) Mobility:  Pt cued for weight shift and ventilation with transfers Locomotion : Ambulation Ambulation/Gait Assistance: 4: Min guard;4: Min assist (200'x2 in pm)  Balance: Static Sitting Balance Static Sitting - Balance Support: Feet supported;No upper extremity supported Static Sitting - Level of Assistance: 5: Stand by assistance Dynamic Sitting Balance Dynamic Sitting - Balance Support: No upper extremity supported;Feet supported Dynamic Sitting - Level of Assistance: 5: Stand by assistance Static Standing Balance Static Standing - Balance Support: Right upper extremity supported Static Standing - Level of Assistance: 5: Stand by assistance Dynamic Standing Balance Dynamic Standing - Balance Support: Bilateral upper extremity supported Dynamic Standing - Level of Assistance: 4: Min assist Dynamic Standing - Balance Activities: Reaching for objects Other Treatments:   Pt performs transfer training x10 in session. Skin WFL after orthotic use. Pt performs standing balance static and dynamic x6'. Pt performs sitting balance with LE manipulation x5'. Pt educated on rehab plan, pain science, and pain management options. L/S myofascial release and R iliosacral upslip correction performed. Pt performs [employer] position x3'.   See FIM for current functional status  Therapy/Group: Individual Therapy  [name] 07/28/2014, 5:21 PM

## 2014-07-28 NOTE — Progress Notes (Signed)
Patient ID: [name], male   DOB: [birth year], 68 y.o.   MRN: [id number]   Subjective/Complaints: 68 y.o. male with history of OSA, HTN, CAF, ongoing alcohol abuse, chronic pain; who was admitted on 07/21/14 with fatigue, increased weakness and BLE edema. He was treated with IV lasix due to complaints of SOB and Dr. Haroldine Laws consulted for input. CT head with moderate generalized atrophy with progression since 2011. He felt that volume status was stable but patient with polyarticular gout flare in setting of ETOH abuse and added steroids for treatment. Uric acid 11.1 with TSH 5.74. X rays lumbar spine with extensive spondylosis and osteoarthritic change with diffuse idiopathic skeletal hyperostosis. Therapy evaluation done and patient with significant balance deficits with ataxia as well as difficulty with mobility due to gout flare  Feels congested today. Feels dizzy when he gets up. His left side bothers him. No swallowing issues   Review of systems is negative except as above. He does have bilateral foot drop noted worse on the left side Objective: Vital Signs: Blood pressure 161/88, pulse 79, temperature 97.8 F (36.6 C), temperature source Oral, resp. rate 17, height 5' 7.5" (1.715 m), weight 89.4 kg (197 lb 1.5 oz), SpO2 95.00%. No results found. Results for orders placed during the hospital encounter of 07/24/14 (from the past 72 hour(s))  GLUCOSE, CAPILLARY     Status: Abnormal   Collection Time    07/25/14 11:19 AM      Result Value Ref Range   Glucose-Capillary 108 (*) 70 - 99 mg/dL  GLUCOSE, CAPILLARY     Status: Abnormal   Collection Time    07/25/14  4:26 PM      Result Value Ref Range   Glucose-Capillary 200 (*) 70 - 99 mg/dL  GLUCOSE, CAPILLARY     Status: Abnormal   Collection Time    07/25/14  9:24 PM      Result Value Ref Range   Glucose-Capillary 254 (*) 70 - 99 mg/dL  BASIC METABOLIC PANEL     Status: Abnormal   Collection Time    07/26/14  5:25 AM   Result Value Ref Range   Sodium 136 (*) 137 - 147 mEq/L   Potassium 3.8  3.7 - 5.3 mEq/L   Chloride 93 (*) 96 - 112 mEq/L   CO2 33 (*) 19 - 32 mEq/L   Glucose, Bld 160 (*) 70 - 99 mg/dL   BUN 49 (*) 6 - 23 mg/dL   Creatinine, Ser 0.92  0.50 - 1.35 mg/dL   Calcium 9.4  8.4 - 10.5 mg/dL   GFR calc non Af Amer 83 (*) >90 mL/min   GFR calc Af Amer >90  >90 mL/min   Comment: (NOTE)     The eGFR has been calculated using the CKD EPI equation.     This calculation has not been validated in all clinical situations.     eGFR's persistently <90 mL/min signify possible Chronic Kidney     Disease.   Anion gap 10  5 - 15  GLUCOSE, CAPILLARY     Status: Abnormal   Collection Time    07/26/14  7:12 AM      Result Value Ref Range   Glucose-Capillary 151 (*) 70 - 99 mg/dL  GLUCOSE, CAPILLARY     Status: Abnormal   Collection Time    07/26/14 11:16 AM      Result Value Ref Range   Glucose-Capillary 122 (*) 70 - 99 mg/dL  GLUCOSE, CAPILLARY  Status: Abnormal   Collection Time    07/26/14  4:50 PM      Result Value Ref Range   Glucose-Capillary 197 (*) 70 - 99 mg/dL  GLUCOSE, CAPILLARY     Status: Abnormal   Collection Time    07/26/14  8:50 PM      Result Value Ref Range   Glucose-Capillary 217 (*) 70 - 99 mg/dL   Comment 1 Notify RN    BASIC METABOLIC PANEL     Status: Abnormal   Collection Time    07/27/14  5:05 AM      Result Value Ref Range   Sodium 137  137 - 147 mEq/L   Potassium 3.7  3.7 - 5.3 mEq/L   Chloride 93 (*) 96 - 112 mEq/L   CO2 33 (*) 19 - 32 mEq/L   Glucose, Bld 156 (*) 70 - 99 mg/dL   BUN 45 (*) 6 - 23 mg/dL   Creatinine, Ser 0.92  0.50 - 1.35 mg/dL   Calcium 9.2  8.4 - 10.5 mg/dL   GFR calc non Af Amer 83 (*) >90 mL/min   GFR calc Af Amer >90  >90 mL/min   Comment: (NOTE)     The eGFR has been calculated using the CKD EPI equation.     This calculation has not been validated in all clinical situations.     eGFR's persistently <90 mL/min signify possible  Chronic Kidney     Disease.   Anion gap 11  5 - 15  GLUCOSE, CAPILLARY     Status: Abnormal   Collection Time    07/27/14  7:02 AM      Result Value Ref Range   Glucose-Capillary 147 (*) 70 - 99 mg/dL   Comment 1 Notify RN    CBC WITH DIFFERENTIAL     Status: Abnormal   Collection Time    07/27/14 11:35 AM      Result Value Ref Range   WBC 6.7  4.0 - 10.5 K/uL   RBC 3.95 (*) 4.22 - 5.81 MIL/uL   Hemoglobin 12.2 (*) 13.0 - 17.0 g/dL   HCT 37.5 (*) 39.0 - 52.0 %   MCV 94.9  78.0 - 100.0 fL   MCH 30.9  26.0 - 34.0 pg   MCHC 32.5  30.0 - 36.0 g/dL   RDW 15.3  11.5 - 15.5 %   Platelets 137 (*) 150 - 400 K/uL   Neutrophils Relative % 66  43 - 77 %   Neutro Abs 4.4  1.7 - 7.7 K/uL   Lymphocytes Relative 21  12 - 46 %   Lymphs Abs 1.4  0.7 - 4.0 K/uL   Monocytes Relative 11  3 - 12 %   Monocytes Absolute 0.8  0.1 - 1.0 K/uL   Eosinophils Relative 2  0 - 5 %   Eosinophils Absolute 0.1  0.0 - 0.7 K/uL   Basophils Relative 0  0 - 1 %   Basophils Absolute 0.0  0.0 - 0.1 K/uL  GLUCOSE, CAPILLARY     Status: Abnormal   Collection Time    07/27/14 11:48 AM      Result Value Ref Range   Glucose-Capillary 179 (*) 70 - 99 mg/dL   Comment 1 Notify RN    GLUCOSE, CAPILLARY     Status: Abnormal   Collection Time    07/27/14  5:03 PM      Result Value Ref Range   Glucose-Capillary 205 (*) 70 - 99  mg/dL   Comment 1 Notify RN    GLUCOSE, CAPILLARY     Status: Abnormal   Collection Time    07/27/14  8:33 PM      Result Value Ref Range   Glucose-Capillary 149 (*) 70 - 99 mg/dL   Comment 1 Notify RN    BASIC METABOLIC PANEL     Status: Abnormal   Collection Time    07/28/14  6:36 AM      Result Value Ref Range   Sodium 138  137 - 147 mEq/L   Potassium 3.4 (*) 3.7 - 5.3 mEq/L   Chloride 94 (*) 96 - 112 mEq/L   CO2 33 (*) 19 - 32 mEq/L   Glucose, Bld 125 (*) 70 - 99 mg/dL   BUN 46 (*) 6 - 23 mg/dL   Creatinine, Ser 0.93  0.50 - 1.35 mg/dL   Calcium 9.4  8.4 - 10.5 mg/dL   GFR calc  non Af Amer 83 (*) >90 mL/min   GFR calc Af Amer >90  >90 mL/min   Comment: (NOTE)     The eGFR has been calculated using the CKD EPI equation.     This calculation has not been validated in all clinical situations.     eGFR's persistently <90 mL/min signify possible Chronic Kidney     Disease.   Anion gap 11  5 - 15  GLUCOSE, CAPILLARY     Status: Abnormal   Collection Time    07/28/14  6:42 AM      Result Value Ref Range   Glucose-Capillary 124 (*) 70 - 99 mg/dL   Comment 1 Notify RN       HEENT: White plaques on posterior pharynx, cerumen nose is bilateral external auditory canals, left eye with some dried drainage at the medial canthus Cardio: RRR and No murmur Resp: CTA B/L and Unlabored GI: BS positive and Nontender nondistended Extremity:  No Edema  Neuro: Lethargic but awakens to voice. Musc/Skel:  Other No pain with upper extremity or lower extremity range of motion, no evidence of joint swelling Gen. no acute distress   Assessment/Plan: 1. Functional deficits secondary to Gout flare and severe diabetic neuropathy which require 3+ hours per day of interdisciplinary therapy in a comprehensive inpatient rehab setting. Physiatrist is providing close team supervision and 24 hour management of active medical problems listed below. Physiatrist and rehab team continue to assess barriers to discharge/monitor patient progress toward functional and medical goals. FIM: FIM - Bathing Bathing Steps Patient Completed: Chest;Right Arm;Left Arm;Abdomen Bathing: 2: Max-Patient completes 3-4 [id number] parts or 25-49%  FIM - Upper Body Dressing/Undressing Upper body dressing/undressing steps patient completed: Thread/unthread right sleeve of pullover shirt/dresss;Thread/unthread left sleeve of pullover shirt/dress;Put head through opening of pull over shirt/dress Upper body dressing/undressing: 4: Min-Patient completed 75 plus % of tasks FIM - Lower Body Dressing/Undressing Lower body  dressing/undressing steps patient completed: Pull pants up/down Lower body dressing/undressing: 1: Total-Patient completed less than 25% of tasks  FIM - Toileting Toileting steps completed by patient: Adjust clothing prior to toileting Toileting Assistive Devices: Grab bar or rail for support Toileting: 2: Max-Patient completed 1 of 3 steps  FIM - TRadio producerDevices: Grab bars Toilet Transfers: 3-To toilet/BSC: Mod A (lift or lower assist);3-From toilet/BSC: Mod A (lift or lower assist)  FIM - BControl and instrumentation engineerDevices: Walker;Arm rests Bed/Chair Transfer: 4: Bed > Chair or W/C: Min A (steadying Pt. > 75%);4: Chair or W/C >  Bed: Min A (steadying Pt. > 75%)  FIM - Locomotion: Wheelchair Distance: 75 Locomotion: Wheelchair: 0: Activity did not occur FIM - Locomotion: Ambulation Locomotion: Ambulation Assistive Devices: Orthosis;Other (comment);Walker - Rolling Ambulation/Gait Assistance: 4: Min guard;4: Min assist (200'x2 in pm, 200'x1 in am) Locomotion: Ambulation: 4: Travels 150 ft or more with minimal assistance (Pt.>75%)  Comprehension Comprehension Mode: Auditory Comprehension: 5-Follows basic conversation/direction: With no assist  Expression Expression Mode: Verbal Expression: 5-Expresses basic 90% of the time/requires cueing < 10% of the time.  Social Interaction Social Interaction: 2-Interacts appropriately 25 - 49% of time - Needs frequent redirection.  Problem Solving Problem Solving: 4-Solves basic 75 - 89% of the time/requires cueing 10 - 24% of the time  Memory Memory: 5-Recognizes or recalls 90% of the time/requires cueing < 10% of the time  Medical Problem List and Plan:  1. Functional deficits secondary to Gout flare and severe diabetic peripheral neuropathy.  2. DVT Prophylaxis/Anticoagulation: Pharmaceutical: Lovenox--monitor platelets with history of thrombocytopenia.  3. Pain Management:  continue colchicine and prednisone.  4. Mood: NO signs of ETOH withdrawal noted. CIWA protocol. Continue prozac. LCSW to follow for evaluation and support.  5. Neuropsych: This patient is capable of making decisions on his own behalf.  6. Skin/Wound Care: Routine pressure relief measures.  7. Fluids/Electrolytes/Nutrition: Monitor strict I/O. Offer nutritional supplements if po intake poor.  8. Chronic diastolic HF: Check daily weights. Low salt diet. Continue lasix, coreg and lipitor. Oxygen at bedtime.  9. DM type 2 with polyneuropathy: Monitor BS with ac/hs checks. Continue lantus insulin with SS coverage as anticipate BS variation due to prednisone.  10. Alcohol abuse with gait disorder: Resume lactulose.  11. Metastatic breast cancer: Awaiting surgery.  12. Gout flare: Will taper prednisone as symptoms improving. On allopurinol in addition to colchicine.  13. Dizziness with diplopia: Has history of inner ear problems in the past. Will order vestibular rehab. Reports diplopia with near vision--question accomodation issues v/s neurologic changes. Question repeat CT head as also showing left lean with ambulation.  14. Evidence of thrush, start Diflucan 15 mild allergic conjunctivitis start eyedrops 16 cerumenosis to start drops LOS (Days) 4 A FACE TO FACE EVALUATION WAS PERFORMED  Edgar Corrigan E 07/28/2014, 10:12 AM

## 2014-07-29 ENCOUNTER — Inpatient Hospital Stay (HOSPITAL_COMMUNITY): Payer: Medicare Other | Admitting: Physical Therapy

## 2014-07-29 ENCOUNTER — Inpatient Hospital Stay (HOSPITAL_COMMUNITY): Payer: Medicare Other | Admitting: Occupational Therapy

## 2014-07-29 DIAGNOSIS — I482 Chronic atrial fibrillation: Secondary | ICD-10-CM

## 2014-07-29 DIAGNOSIS — E1159 Type 2 diabetes mellitus with other circulatory complications: Secondary | ICD-10-CM

## 2014-07-29 DIAGNOSIS — F1010 Alcohol abuse, uncomplicated: Secondary | ICD-10-CM

## 2014-07-29 LAB — GLUCOSE, CAPILLARY
GLUCOSE-CAPILLARY: 130 mg/dL — AB (ref 70–99)
GLUCOSE-CAPILLARY: 116 mg/dL — AB (ref 70–99)
Glucose-Capillary: 131 mg/dL — ABNORMAL HIGH (ref 70–99)
Glucose-Capillary: 99 mg/dL (ref 70–99)

## 2014-07-29 LAB — BASIC METABOLIC PANEL
Anion gap: 12 (ref 5–15)
BUN: 49 mg/dL — ABNORMAL HIGH (ref 6–23)
CALCIUM: 9.3 mg/dL (ref 8.4–10.5)
CHLORIDE: 94 meq/L — AB (ref 96–112)
CO2: 33 meq/L — AB (ref 19–32)
Creatinine, Ser: 1.05 mg/dL (ref 0.50–1.35)
GFR calc Af Amer: 81 mL/min — ABNORMAL LOW (ref >90)
GFR calc non Af Amer: 70 mL/min — ABNORMAL LOW (ref >90)
GLUCOSE: 117 mg/dL — AB (ref 70–99)
Potassium: 3.6 mEq/L — ABNORMAL LOW (ref 3.7–5.3)
Sodium: 139 mEq/L (ref 137–147)

## 2014-07-29 MED ORDER — POTASSIUM CHLORIDE CRYS ER 20 MEQ PO TBCR
40.0000 meq | EXTENDED_RELEASE_TABLET | Freq: Once | ORAL | Status: AC
  Administered 2014-07-29: 40 meq via ORAL
  Filled 2014-07-29: qty 2

## 2014-07-29 NOTE — Plan of Care (Signed)
Problem: RH Wheelchair Mobility Goal: LTG Patient will propel w/c in home environment (PT) LTG: Patient will propel wheelchair in home environment, # of feet with assistance (PT).  Outcome: Not Applicable Date Met:  21/19/41 N/A; suspect pt will be ambulatory in household environment at discharge.

## 2014-07-29 NOTE — Evaluation (Addendum)
Physical Therapy Vestibular Assessment and Plan  Patient Details  Name: [name] MRN: [id number] Date of Birth: [birth year]  PT Diagnosis: Dizziness and giddiness  Today's Date: 07/29/2014 PT Individual Time:  5009-3818  PT Individual Time Calculation (min): 60 min   Problem List:  Patient Active Problem List   Diagnosis Date Noted  . Thrush, oral 07/28/2014  . Debility 07/25/2014  . Diabetic polyneuropathy associated with type 2 diabetes mellitus 07/25/2014  . Gout flare 07/24/2014  . Acute gout 07/22/2014  . CHF exacerbation 07/21/2014  . Pre-operative cardiovascular examination 07/15/2014  . Family history of malignant neoplasm of gastrointestinal tract 06/12/2014  . Male breast cancer 06/12/2014  . Cancer of male breast, right 05/29/2014  . Acute on chronic renal failure 04/29/2014  . 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 Acute on chronic systolic and diastolic heart failure, NYHA class 4 04/20/2014  . Diabetes mellitus 01/28/2014  . Panniculitis 04/14/2013  . Acute on chronic diastolic heart failure 29/93/7169  . Seasonal allergies 12/20/2012  . L1 vertebral fracture 09/07/2012  . Acute renal failure 08/30/2012  . Hypothyroidism 08/30/2012  . ETOH abuse   . Atrial fibrillation   . Chronic diastolic heart failure   . DM (diabetes mellitus) type II uncontrolled, periph vascular disorder 06/20/2009  . Obstructive sleep apnea 06/20/2009  . CAROTID STENOSIS 06/20/2009  . CHRONIC OBSTRUCTIVE PULMONARY DISEASE 06/20/2009  . CAD 06/20/2009  . Hypderlipidemia   . Hypertensive heart disease    Past Medical History:  Past Medical History  Diagnosis Date  . CHRONIC OBSTRUCTIVE PULMONARY DISEASE 06/20/2009  . OBSTRUCTIVE SLEEP APNEA 06/20/2009  . CAROTID STENOSIS 06/20/2009    A. 08/2001 s/p L CEA;  B.   09/14/11 - Carotid U/S - 40-59% bilateral stenosis, left CEA patch angioplasty is patent  . DM 06/20/2009  . CAD 06/20/2009    A.   08/2000 - s/p CABG x 4 - LIMA-LAD, Left Radial-OM, VG-DIAG, VG-RCA;  B. Neg. MV  2010  . HYPERLIPIDEMIA 06/20/2009  . HYPERTENSION 06/20/2009  . Hypothyroidism   . Low back pain   . Asthma     as child  . Pneumonia   . Atrial fibrillation     Not felt to be coumadin candidate 2/2 ETOH use.  Marland Kitchen ETOH abuse   . History of tobacco abuse     remote - quit 1970  . Bilateral renal cysts   . Marijuana abuse   . Morbidly obese   . CHF (congestive heart failure)   . Falls frequently   . Hx of cardiovascular stress test 05/2014    Lexiscan Myoview (8/15):  No ischemia; EF 63% - Normal Study  . Breast cancer, right breast   . Gout    Past Surgical History:  Past Surgical History  Procedure Laterality Date  . Carotid endarterectomy  2002    left  . Coronary artery bypass graft      x 4 - 2001  . Umbilical hernia repair N/A 01/28/2014    Procedure: HERNIA REPAIR UMBILICAL ADULT/INC;  Surgeon: Harl Bowie, MD;  Location: [facility];  Service: General;  Laterality: N/A;  . Laparotomy N/A 01/28/2014    Procedure: EXPLORATORY LAPAROTOMY;  Surgeon: Harl Bowie, MD;  Location: [facility];  Service: General;  Laterality: N/A;  . Bowel resection N/A 01/28/2014    Procedure: SMALL BOWEL RESECTION;  Surgeon: Harl Bowie, MD;  Location: [place];  Service: General;  Laterality: N/A;  .  Hernia repair     Subjective: Pt reports feeling "woozy" and perception of head "feeling funny" with sit>stand transfer, dynamic standing, and walking. Per daughter, [name], pt began reporting lightheadedness approximately 2-3 weeks prior to admission. Pt denies diplopia at this time but has reported diplopia during past sessions with near vision in R superior visual field. With active cervical spine rotation, pt denies signs/symptoms of VBI/CAD. No significant hearing loss.  Evaluation: Orthostatic hypotension On PT evaluation (10/15), noted 9 mm Hg decrease in diastolic BP with sit>stand transfer; symptomatic   Vertebral Artery Test (-) bilaterally  Eye Alignment Grossly WNL  Spontaneous  Nystagmus Not observed  Gaze holding nystagmus Not observed  Smooth pursuit Grossly WNL  Oculomotor Convergence appears WNL  Saccades Minimal undershooting with vertical saccades, but grossly WNL  VOR slow Grossly WNL with horizontal and vertical head turns  Head Thrust Test (-) and asymptomatic bilaterally  Head Shaking Nystagmus (+) with use of Frenzel lenses (to remove visual fixation)  L beating nystagmus x6 beats following vertical head shaking  L beating nystagmus x4 beats following horizontal head shaking  Rt. Marye Round Not assessed due to no vertiginous (spinning) symptoms, no onset/exacerbation with positional change, and no recent history of fall or head trauma.  Henderson Newcomer Not assessed; see above.  Rt. Roll Test Not assessed; see above.  Lt Roll Test Not assessed; see above.  VOR Cancellation Abnormal   Findings:  Unable to rule out central vestibular dysfunction due to abnormal VOR cancellation; however, all other tests for central dysfunction are negative. Positive Head Shaking Nystagmus Test is also suggestive of vestibular imbalance (central or peripheral). The following comorbidities may also cause pt-perceived disequilibrium: longstanding peripheral neuropathy, diabetes mellitis, hypertension, coronary artery disease, CHF, COPD, ETOH abuse.  Plan:  1. Adaptation: Static and dynamic balance exercises, gait training, and gaze stabilization exercises to promote nervous system adaptation to loss of vestibular input. 2.  Compensation: to facilitate safety with functional mobility and safe discharge home, may utilize compensatory strategies (use of assistive device).  Precautions/Restrictions Precautions Precautions: None Precaution Comments: Make sure Teds are on prior to standing/gait; check skin after orthosis use Restrictions Weight Bearing Restrictions: No General   Vital  SignsTherapy Vitals Temp: 98 F (36.7 C) Temp Source: Oral Pulse Rate: 71 Resp: 18 BP: 115/40 mmHg (error) Patient Position (if appropriate): Sitting Oxygen Therapy SpO2: 96 % Pain Pt with no c/o pain during AM physical therapy session. Vision/Perception  Vision - Assessment Eye Alignment: Within Functional Limits Ocular Range of Motion: Within Functional Limits Tracking/Visual Pursuits: Able to track stimulus in all quads without difficulty Saccades: Undershoots;Other (comment) (with vertical saccades) Convergence: Within functional limits Diplopia Assessment: Only with right gaze;Other (comment);Present in near gaze (inconsistent report)  FIM:  FIM - Control and instrumentation engineer Devices: Arm rests;Walker;Orthosis (L AFO (Reaction)) Bed/Chair Transfer: 5: Bed > Chair or W/C: Supervision (verbal cues/safety issues);5: Chair or W/C > Bed: Supervision (verbal cues/safety issues) FIM - Locomotion: Wheelchair Distance: 100 Locomotion: Wheelchair: 2: Travels 50 - 149 ft with supervision, cueing or coaxing FIM - Locomotion: Ambulation Locomotion: Ambulation Assistive Devices: Orthosis;Walker - Rolling Ambulation/Gait Assistance: 5: Supervision Locomotion: Ambulation: 5: Travels 150 ft or more with supervision/safety issues  Stefano Gaul 07/29/2014, 6:26 PM

## 2014-07-29 NOTE — Progress Notes (Signed)
  Stopped by for social visit. He is feeling better. Volume looks good. No new recs. Appreciate CIR's care. Consider getting daily weights.   Cassity Christian,MD 1:08 PM

## 2014-07-29 NOTE — Progress Notes (Addendum)
Physical Therapy Session Note  Patient Details  Name: [name] MRN: [id number] Date of Birth: 09-29-1946  Today's Date: 07/29/2014 PT Individual [id number] PT Individual Time Calculation (min): 30 min  Short Term Goals: Week 1:  PT Short Term Goal 1 (Week 1): Pt will perform supine>sit with supervision with HOB flat without rail. PT Short Term Goal 2 (Week 1): Pt will transfer from bed<>chair with min A with 25% cueing for sequencing and technique. PT Short Term Goal 3 (Week 1): Pt will perform static standing x1 minute with supervision and no LOB. PT Short Term Goal 4 (Week 1): Pt will ambulate x100' in controlled environment with LRAD and Mod A of single therapist. PT Short Term Goal 5 (Week 1): Pt will perform w/c mobility x100' consecutively without rest breaks with supervision.  Skilled Therapeutic Interventions/Progress Updates:    Treatment Session 1: See separate note for vestibular evaluation findings.   Treatment Session 2: Pt received seated in w/c; agreeable to therapy. Session focused on activity tolerance and car transfers. Pt performed gait x150' in controlled environment with rolling walker and supervision; gait trial ended, per pt request, due to fatigue. Performed w/c mobility x100' in controlled environment with bilat LE's and supervision. In ortho gym, this therapist explained, demonstrated car transfer using rolling walker. Pt gave effective return demonstration of simulated car transfer with rolling walker and supervision, increased time, and cueing for safe hand placement. Multiple rest breaks (<3 minutes duration) required between each activity due to pt fatigue.Transported pt back to room with total A due to pt fatigue. Session ended in pt room, where pt was left seated in w/c with bilat LE's elevated and all needs within reach.  Upgraded long term goals addressing gait distance in controlled environment; discharged goals addressing w/c mobility in home  environment.  Therapy Documentation Precautions:  Precautions Precautions: None Precaution Comments: Make sure Teds are on prior to standing/gait, check skin after orthosis use tolerated 4 hours today Restrictions Weight Bearing Restrictions: No Vital Signs: Therapy Vitals Temp: 98 F (36.7 C) Temp Source: Oral Pulse Rate: 71 Resp: 18 BP: 147/67 mmHg Patient Position (if appropriate): Sitting Oxygen Therapy SpO2: 96 % Pain: Pain Assessment Pain Assessment: No/denies pain Locomotion : Ambulation Ambulation/Gait Assistance: 4: Min guard;5: Supervision   See FIM for current functional status  Therapy/Group: Individual Therapy  Stefano Gaul 07/29/2014, 4:23 PM

## 2014-07-29 NOTE — Progress Notes (Signed)
Occupational Therapy Session Note  Patient Details  Name: [name] MRN: [id number] Date of Birth: [birth year]  Today's Date: 07/29/2014 OT Individual Time: 1100-1200 and [id number] OT Individual Time Calculation (min): 60 min and 30 min   Short Term Goals: Week 1:  OT Short Term Goal 1 (Week 1): Pt will engage in 10 minutes of functional task without requiring rest break in order to increase endurance for functional tasks.  OT Short Term Goal 2 (Week 1): Pt will perform LB dressing with Mod A in order to increase I in self care. OT Short Term Goal 3 (Week 1): Pt will perform toileting with Mod A in order to increase I in self care. OT Short Term Goal 4 (Week 1): Pt will perform shower transfer with Min A in order to increase I in functional transfer.  OT Short Term Goal 5 (Week 1): Pt will perform toilet transfer with Min A in order to increase I in functional transfers.  Skilled Therapeutic Interventions/Progress Updates:    1) Engaged in therapeutic activity with focus on functional mobility with RW, transfers, sit <> stand, and standing tolerance during functional tasks.  Pt reports not interested in bathing and dressing this session.  Ambulated with RW to toilet with min assist, mod verbal cues and encouragement to attempt steps of toileting tasks as pt would ask for assistance with each step prior to attempting.  In therapy gym engaged in standing activity at table with focus on weight shifting and standing tolerance to build up endurance as well as functional use of BUE.  Pt reports mild pain in LLE after standing activity, however willing to ambulate back to room after short seated rest break.  Pt ambulated > 150 feet with min assist.  2) Engaged in ADL retraining with focus on functional mobility and tranfers in home environment.  Upon arrival, pt reports needing to toilet and requested therapist push his w/c into bathroom.  Encouraged pt to ambulate with RW to continue to focus on  endurance as pt most likely will d/c at ambulatory level.  Pt able to don Rt shoe and fasten this session, however continues to require assist with Lt shoe with AFO.  Min assist toilet transfers and mod verbal cues to attempt toileting tasks.  Pt required assistance to wipe buttocks.  Reports "heavy headed" post toileting and pulling pants over hips, however vitals WNL. RN present at end of session to provide pain meds.  Therapy Documentation Precautions:  Precautions Precautions: None Precaution Comments: Make sure Teds are on prior to standing/gait, check skin after orthosis use tolerated 4 hours today Restrictions Weight Bearing Restrictions: No Pain: Pain Assessment Pain Assessment: No/denies pain Pain Score: 7   See FIM for current functional status  Therapy/Group: Individual Therapy  Simonne Come 07/29/2014, 12:57 PM

## 2014-07-29 NOTE — Progress Notes (Signed)
Patient ID: [name], male   DOB: December 02, 1945, 68 y.o.   MRN: [id number]   Subjective/Complaints: 68 y.o. male with history of OSA, HTN, CAF, ongoing alcohol abuse, chronic pain; who was admitted on 07/21/14 with fatigue, increased weakness and BLE edema. He was treated with IV lasix due to complaints of SOB and Dr. Haroldine Laws consulted for input. CT head with moderate generalized atrophy with progression since 2011. He felt that volume status was stable but patient with polyarticular gout flare in setting of ETOH abuse and added steroids for treatment. Uric acid 11.1 with TSH 5.74. X rays lumbar spine with extensive spondylosis and osteoarthritic change with diffuse idiopathic skeletal hyperostosis. Therapy evaluation done and patient with significant balance deficits with ataxia as well as difficulty with mobility due to gout flare  Eye is ok Ear is better   Review of systems is negative except as above. He does have bilateral foot drop noted worse on the left side Objective: Vital Signs: Blood pressure 136/72, pulse 62, temperature 98 F (36.7 C), temperature source Oral, resp. rate 18, height 5' 7.5" (1.715 m), weight 89.4 kg (197 lb 1.5 oz), SpO2 97.00%. No results found. Results for orders placed during the hospital encounter of 07/24/14 (from the past 72 hour(s))  GLUCOSE, CAPILLARY     Status: Abnormal   Collection Time    07/26/14 11:16 AM      Result Value Ref Range   Glucose-Capillary 122 (*) 70 - 99 mg/dL  GLUCOSE, CAPILLARY     Status: Abnormal   Collection Time    07/26/14  4:50 PM      Result Value Ref Range   Glucose-Capillary 197 (*) 70 - 99 mg/dL  GLUCOSE, CAPILLARY     Status: Abnormal   Collection Time    07/26/14  8:50 PM      Result Value Ref Range   Glucose-Capillary 217 (*) 70 - 99 mg/dL   Comment 1 Notify RN    BASIC METABOLIC PANEL     Status: Abnormal   Collection Time    07/27/14  5:05 AM      Result Value Ref Range   Sodium 137  137 - 147 mEq/L    Potassium 3.7  3.7 - 5.3 mEq/L   Chloride 93 (*) 96 - 112 mEq/L   CO2 33 (*) 19 - 32 mEq/L   Glucose, Bld 156 (*) 70 - 99 mg/dL   BUN 45 (*) 6 - 23 mg/dL   Creatinine, Ser 0.92  0.50 - 1.35 mg/dL   Calcium 9.2  8.4 - 10.5 mg/dL   GFR calc non Af Amer 83 (*) >90 mL/min   GFR calc Af Amer >90  >90 mL/min   Comment: (NOTE)     The eGFR has been calculated using the CKD EPI equation.     This calculation has not been validated in all clinical situations.     eGFR's persistently <90 mL/min signify possible Chronic Kidney     Disease.   Anion gap 11  5 - 15  GLUCOSE, CAPILLARY     Status: Abnormal   Collection Time    07/27/14  7:02 AM      Result Value Ref Range   Glucose-Capillary 147 (*) 70 - 99 mg/dL   Comment 1 Notify RN    CBC WITH DIFFERENTIAL     Status: Abnormal   Collection Time    07/27/14 11:35 AM      Result Value Ref Range   WBC  6.7  4.0 - 10.5 K/uL   RBC 3.95 (*) 4.22 - 5.81 MIL/uL   Hemoglobin 12.2 (*) 13.0 - 17.0 g/dL   HCT 37.5 (*) 39.0 - 52.0 %   MCV 94.9  78.0 - 100.0 fL   MCH 30.9  26.0 - 34.0 pg   MCHC 32.5  30.0 - 36.0 g/dL   RDW 15.3  11.5 - 15.5 %   Platelets 137 (*) 150 - 400 K/uL   Neutrophils Relative % 66  43 - 77 %   Neutro Abs 4.4  1.7 - 7.7 K/uL   Lymphocytes Relative 21  12 - 46 %   Lymphs Abs 1.4  0.7 - 4.0 K/uL   Monocytes Relative 11  3 - 12 %   Monocytes Absolute 0.8  0.1 - 1.0 K/uL   Eosinophils Relative 2  0 - 5 %   Eosinophils Absolute 0.1  0.0 - 0.7 K/uL   Basophils Relative 0  0 - 1 %   Basophils Absolute 0.0  0.0 - 0.1 K/uL  GLUCOSE, CAPILLARY     Status: Abnormal   Collection Time    07/27/14 11:48 AM      Result Value Ref Range   Glucose-Capillary 179 (*) 70 - 99 mg/dL   Comment 1 Notify RN    GLUCOSE, CAPILLARY     Status: Abnormal   Collection Time    07/27/14  5:03 PM      Result Value Ref Range   Glucose-Capillary 205 (*) 70 - 99 mg/dL   Comment 1 Notify RN    GLUCOSE, CAPILLARY     Status: Abnormal   Collection Time     07/27/14  8:33 PM      Result Value Ref Range   Glucose-Capillary 149 (*) 70 - 99 mg/dL   Comment 1 Notify RN    BASIC METABOLIC PANEL     Status: Abnormal   Collection Time    07/28/14  6:36 AM      Result Value Ref Range   Sodium 138  137 - 147 mEq/L   Potassium 3.4 (*) 3.7 - 5.3 mEq/L   Chloride 94 (*) 96 - 112 mEq/L   CO2 33 (*) 19 - 32 mEq/L   Glucose, Bld 125 (*) 70 - 99 mg/dL   BUN 46 (*) 6 - 23 mg/dL   Creatinine, Ser 0.93  0.50 - 1.35 mg/dL   Calcium 9.4  8.4 - 10.5 mg/dL   GFR calc non Af Amer 83 (*) >90 mL/min   GFR calc Af Amer >90  >90 mL/min   Comment: (NOTE)     The eGFR has been calculated using the CKD EPI equation.     This calculation has not been validated in all clinical situations.     eGFR's persistently <90 mL/min signify possible Chronic Kidney     Disease.   Anion gap 11  5 - 15  GLUCOSE, CAPILLARY     Status: Abnormal   Collection Time    07/28/14  6:42 AM      Result Value Ref Range   Glucose-Capillary 124 (*) 70 - 99 mg/dL   Comment 1 Notify RN    GLUCOSE, CAPILLARY     Status: Abnormal   Collection Time    07/28/14 12:08 PM      Result Value Ref Range   Glucose-Capillary 145 (*) 70 - 99 mg/dL   Comment 1 Notify RN    GLUCOSE, CAPILLARY     Status: Abnormal  Collection Time    07/28/14  4:46 PM      Result Value Ref Range   Glucose-Capillary 141 (*) 70 - 99 mg/dL   Comment 1 Notify RN    GLUCOSE, CAPILLARY     Status: Abnormal   Collection Time    07/28/14  8:34 PM      Result Value Ref Range   Glucose-Capillary 203 (*) 70 - 99 mg/dL  BASIC METABOLIC PANEL     Status: Abnormal   Collection Time    07/29/14  5:45 AM      Result Value Ref Range   Sodium 139  137 - 147 mEq/L   Potassium 3.6 (*) 3.7 - 5.3 mEq/L   Chloride 94 (*) 96 - 112 mEq/L   CO2 33 (*) 19 - 32 mEq/L   Glucose, Bld 117 (*) 70 - 99 mg/dL   BUN 49 (*) 6 - 23 mg/dL   Creatinine, Ser 1.05  0.50 - 1.35 mg/dL   Calcium 9.3  8.4 - 10.5 mg/dL   GFR calc non Af Amer  70 (*) >90 mL/min   GFR calc Af Amer 81 (*) >90 mL/min   Comment: (NOTE)     The eGFR has been calculated using the CKD EPI equation.     This calculation has not been validated in all clinical situations.     eGFR's persistently <90 mL/min signify possible Chronic Kidney     Disease.   Anion gap 12  5 - 15  GLUCOSE, CAPILLARY     Status: Abnormal   Collection Time    07/29/14  7:26 AM      Result Value Ref Range   Glucose-Capillary 131 (*) 70 - 99 mg/dL     HEENT:  , left eye with some dried drainage at the medial canthus Cardio: RRR and No murmur Resp: CTA B/L and Unlabored GI: BS positive and Nontender nondistended Extremity:  No Edema  Neuro: Lethargic but awakens to voice. Musc/Skel:  Other No pain with upper extremity or lower extremity range of motion, no evidence of joint swelling Gen. no acute distress   Assessment/Plan: 1. Functional deficits secondary to Gout flare and severe diabetic neuropathy which require 3+ hours per day of interdisciplinary therapy in a comprehensive inpatient rehab setting. Physiatrist is providing close team supervision and 24 hour management of active medical problems listed below. Physiatrist and rehab team continue to assess barriers to discharge/monitor patient progress toward functional and medical goals. FIM: FIM - Bathing Bathing Steps Patient Completed: Chest;Right Arm;Left Arm;Abdomen Bathing: 2: Max-Patient completes 3-4 [id number] parts or 25-49%  FIM - Upper Body Dressing/Undressing Upper body dressing/undressing steps patient completed: Thread/unthread right sleeve of pullover shirt/dresss;Thread/unthread left sleeve of pullover shirt/dress;Put head through opening of pull over shirt/dress Upper body dressing/undressing: 4: Min-Patient completed 75 plus % of tasks FIM - Lower Body Dressing/Undressing Lower body dressing/undressing steps patient completed: Pull pants up/down Lower body dressing/undressing: 1: Total-Patient completed  less than 25% of tasks  FIM - Toileting Toileting steps completed by patient: Adjust clothing prior to toileting Toileting Assistive Devices: Grab bar or rail for support Toileting: 1: Total-Patient completed zero steps, helper did all 3  FIM - TRadio producerDevices: Grab bars Toilet Transfers: 4-To toilet/BSC: Min A (steadying Pt. > 75%);4-From toilet/BSC: Min A (steadying Pt. > 75%)  FIM - Bed/Chair Transfer Bed/Chair Transfer Assistive Devices: Arm rests;Walker Bed/Chair Transfer: 4: Chair or W/C > Bed: Min A (steadying Pt. > 75%);4: Bed >  Chair or W/C: Min A (steadying Pt. > 75%);5: Sit > Supine: Supervision (verbal cues/safety issues);5: Supine > Sit: Supervision (verbal cues/safety issues)  FIM - Locomotion: Wheelchair Distance: 75 Locomotion: Wheelchair: 0: Activity did not occur FIM - Locomotion: Ambulation Locomotion: Ambulation Assistive Devices: Orthosis;Other (comment);Walker - Rolling Ambulation/Gait Assistance: 4: Min guard;4: Min assist (200'x2 in pm) Locomotion: Ambulation: 4: Travels 150 ft or more with minimal assistance (Pt.>75%)  Comprehension Comprehension Mode: Auditory Comprehension: 5-Follows basic conversation/direction: With no assist  Expression Expression Mode: Verbal Expression: 5-Expresses basic 90% of the time/requires cueing < 10% of the time.  Social Interaction Social Interaction: 2-Interacts appropriately 25 - 49% of time - Needs frequent redirection.  Problem Solving Problem Solving: 4-Solves basic 75 - 89% of the time/requires cueing 10 - 24% of the time  Memory Memory: 5-Recognizes or recalls 90% of the time/requires cueing < 10% of the time  Medical Problem List and Plan:  1. Functional deficits secondary to Gout flare and severe diabetic peripheral neuropathy.  2. DVT Prophylaxis/Anticoagulation: Pharmaceutical: Lovenox--monitor platelets with history of thrombocytopenia.  3. Pain Management: continue  colchicine and prednisone.  4. Mood: NO signs of ETOH withdrawal noted. CIWA protocol. Continue prozac. LCSW to follow for evaluation and support.  5. Neuropsych: This patient is capable of making decisions on his own behalf.  6. Skin/Wound Care: Routine pressure relief measures.  7. Fluids/Electrolytes/Nutrition: Monitor strict I/O. Offer nutritional supplements if po intake poor.  8. Chronic diastolic HF: Check daily weights. Low salt diet. Continue lasix, coreg and lipitor. Oxygen at bedtime.  9. DM type 2 with polyneuropathy: Monitor BS with ac/hs checks. Continue lantus insulin with SS coverage as anticipate BS variation due to prednisone.  10. Alcohol abuse with gait disorder: Resume lactulose.  11. Metastatic breast cancer: Awaiting surgery.  12. Gout flare: Will taper prednisone as symptoms improving. On allopurinol in addition to colchicine.  13. Dizziness with diplopia: Has history of inner ear problems in the past. Will order vestibular rehab. Reports diplopia with near vision--question accomodation issues v/s neurologic changes. Question repeat CT head as also showing left lean with ambulation.  14. Evidence of thrush, start Diflucan 15 mild allergic conjunctivitis start eyedrops 16 cerumenosis to start drops LOS (Days) 5 A FACE TO FACE EVALUATION WAS PERFORMED  Antonios Ostrow E 07/29/2014, 8:22 AM

## 2014-07-30 ENCOUNTER — Inpatient Hospital Stay (HOSPITAL_COMMUNITY): Payer: Medicare Other | Admitting: *Deleted

## 2014-07-30 ENCOUNTER — Inpatient Hospital Stay (HOSPITAL_COMMUNITY): Payer: Medicare Other | Admitting: Occupational Therapy

## 2014-07-30 ENCOUNTER — Inpatient Hospital Stay (HOSPITAL_COMMUNITY): Payer: Medicare Other | Admitting: Physical Therapy

## 2014-07-30 DIAGNOSIS — I5033 Acute on chronic diastolic (congestive) heart failure: Secondary | ICD-10-CM

## 2014-07-30 DIAGNOSIS — M19041 Primary osteoarthritis, right hand: Secondary | ICD-10-CM | POA: Diagnosis present

## 2014-07-30 DIAGNOSIS — M19042 Primary osteoarthritis, left hand: Secondary | ICD-10-CM

## 2014-07-30 LAB — GLUCOSE, CAPILLARY
GLUCOSE-CAPILLARY: 141 mg/dL — AB (ref 70–99)
Glucose-Capillary: 155 mg/dL — ABNORMAL HIGH (ref 70–99)
Glucose-Capillary: 167 mg/dL — ABNORMAL HIGH (ref 70–99)
Glucose-Capillary: 104 mg/dL — ABNORMAL HIGH (ref 70–99)

## 2014-07-30 MED ORDER — DICLOFENAC SODIUM 1 % TD GEL
2.0000 g | Freq: Four times a day (QID) | TRANSDERMAL | Status: DC
  Administered 2014-07-30 – 2014-08-02 (×13): 2 g via TOPICAL
  Filled 2014-07-30: qty 100

## 2014-07-30 NOTE — Progress Notes (Signed)
Physical Therapy Session Note  Patient Details  Name: [name] MRN: [id number] Date of Birth: Jan 22, 1946  Today's Date: 07/30/2014 PT Individual Time: 4008-6761 and 1300-1331 PT Individual Time Calculation (min): 60 min and 31 min  Short Term Goals: Week 1:  PT Short Term Goal 1 (Week 1): Pt will perform supine>sit with supervision with HOB flat without rail. PT Short Term Goal 2 (Week 1): Pt will transfer from bed<>chair with min A with 25% cueing for sequencing and technique. PT Short Term Goal 3 (Week 1): Pt will perform static standing x1 minute with supervision and no LOB. PT Short Term Goal 4 (Week 1): Pt will ambulate x100' in controlled environment with LRAD and Mod A of single therapist. PT Short Term Goal 5 (Week 1): Pt will perform w/c mobility x100' consecutively without rest breaks with supervision.  Skilled Therapeutic Interventions/Progress Updates:    Treatment Session 1: Pt received semi reclined in bed finishing breakfast. Pt requesting to finish eating breakfast prior to beginning therapy. Therefore, returned 8 minutes later to initiate session. Session focused on stair negotiation, assessment of pt appropriateness for ankle-foot orthotic (AFO). Orthotist, Gerald Stabs, present to assess pt gait pattern assist in making recommendations for AFO. Pt performed functional ambulation 2x15' in controlled environment with rolling walker and close supervision to min guard wearing L Reaction AFO with increased L steppage gait. Pt then performed gait x30' in controlled environment with rolling walker and close supervision wearing L WalkOn AFO (posterior leaf spring) with less L steppage gait and pt report of increased comfort and improved balance confidence. Orthotist also noted less points of contact, less pressure on foot/ankle with L WalkOn AFO as compared with Reaction. Therefore, patient, orthotist, and this PT collectively decided to order [facility] AFO.Orthotist assisted this therapist  in providing extensive pt education on importance of checking skin frequently with use of L AFO due to peripheral neuropathy and LE edema. Pt verbalized understanding of all education.  Transported pt to ortho gym, where pt negotiated 3 stairs with bilat rails, forward-facing with step-to pattern requiring supervision to ascend and min guard to descend due to onset of pt feeling "swimmy headed". Session ended in pt room, where pt was left seated in w/c with all needs within reach.  Treatment Session 2: Pt received seated in w/c accompanied by daughter. Pt agreeable to therapy. Session focused on providing education to daughter in preparation for discharge home. Per daughter's questions of pt perception of "swimmy headedness" with functional mobility, explained vestibular evaluation findings and effect of peripheral neuropathy on proprioception/sensation and functional mobility. Explained rationale for AFO for safety with functional mobility and reiterated education on frequent skin checks with use of L AFO due to comorbidities. Demonstrated donning/doffing of L AFO, per daughter report that she plans to don/doff shoes and socks at discharge. Daughter gave effective return demonstration/  Daughter, patient, and this therapist engaged in interactive discussion of assist required at home prior to admission. Educated daughter on current/expected assist required with functional mobility; daughter reports feeling safe and confident providing all said assist upon D/C home. Therapist strongly recommended that daughter return for hands-on family training/education during session longer than 30 minutes. Daughter expressing inability to do so due to difficulty leaving 4 dogs at home unattended. Therefore, discussed recommendation for further training with CSW, Jacqlyn Larsen, with daughter present. Departed with pt seated in w/c with all needs within reach; daughter speaking with CSW concerning D/C plan.  Therapy  Documentation Precautions:  Precautions Precautions: None Precaution Comments:  Make sure Teds are on prior to standing/gait; check skin after orthosis use Restrictions Weight Bearing Restrictions: No Vital Signs: Therapy Vitals Temp: 97.9 F (36.6 C) Temp Source: Oral Pulse Rate: 68 Resp: 18 BP: 119/60 mmHg Patient Position (if appropriate): Lying Oxygen Therapy SpO2: 97 % O2 Device: None (Room air) Pain: No complaint of pain during AM or PM physical therapy sessions. Locomotion : Ambulation Ambulation/Gait Assistance: 4: Min guard;4: Min assist   See FIM for current functional status  Therapy/Group: Individual Therapy  Hobble, Malva Cogan 07/30/2014, 9:42 AM

## 2014-07-30 NOTE — Progress Notes (Signed)
Patient ID: [name], male   DOB: 11/14/1945, 68 y.o.   MRN: [id number]   Subjective/Complaints: 68 y.o. male with history of OSA, HTN, CAF, ongoing alcohol abuse, chronic pain; who was admitted on 07/21/14 with fatigue, increased weakness and BLE edema. He was treated with IV lasix due to complaints of SOB and Dr. Bensimhon consulted for input. CT head with moderate generalized atrophy with progression since 2011. He felt that volume status was stable but patient with polyarticular gout flare in setting of ETOH abuse and added steroids for treatment. Uric acid 11.1 with TSH 5.74. X rays lumbar spine with extensive spondylosis and osteoarthritic change with diffuse idiopathic skeletal hyperostosis. Therapy evaluation done and patient with significant balance deficits with ataxia as well as difficulty with mobility due to gout flare  Pt without new issues, eyes better wit drops, getting debrox for ears Hand arthritis bothering him more today   Review of systems is negative except as above. He does have bilateral foot drop noted worse on the left side Objective: Vital Signs: Blood pressure 119/60, pulse 68, temperature 97.9 F (36.6 C), temperature source Oral, resp. rate 18, height 5' 7.5" (1.715 m), weight 98.8 kg (217 lb 13 oz), SpO2 97.00%. No results found. Results for orders placed during the hospital encounter of 07/24/14 (from the past 72 hour(s))  CBC WITH DIFFERENTIAL     Status: Abnormal   Collection Time    07/27/14 11:35 AM      Result Value Ref Range   WBC 6.7  4.0 - 10.5 K/uL   RBC 3.95 (*) 4.22 - 5.81 MIL/uL   Hemoglobin 12.2 (*) 13.0 - 17.0 g/dL   HCT 37.5 (*) 39.0 - 52.0 %   MCV 94.9  78.0 - 100.0 fL   MCH 30.9  26.0 - 34.0 pg   MCHC 32.5  30.0 - 36.0 g/dL   RDW 15.3  11.5 - 15.5 %   Platelets 137 (*) 150 - 400 K/uL   Neutrophils Relative % 66  43 - 77 %   Neutro Abs 4.4  1.7 - 7.7 K/uL   Lymphocytes Relative 21  12 - 46 %   Lymphs Abs 1.4  0.7 - 4.0 K/uL    Monocytes Relative 11  3 - 12 %   Monocytes Absolute 0.8  0.1 - 1.0 K/uL   Eosinophils Relative 2  0 - 5 %   Eosinophils Absolute 0.1  0.0 - 0.7 K/uL   Basophils Relative 0  0 - 1 %   Basophils Absolute 0.0  0.0 - 0.1 K/uL  GLUCOSE, CAPILLARY     Status: Abnormal   Collection Time    07/27/14 11:48 AM      Result Value Ref Range   Glucose-Capillary 179 (*) 70 - 99 mg/dL   Comment 1 Notify RN    GLUCOSE, CAPILLARY     Status: Abnormal   Collection Time    07/27/14  5:03 PM      Result Value Ref Range   Glucose-Capillary 205 (*) 70 - 99 mg/dL   Comment 1 Notify RN    GLUCOSE, CAPILLARY     Status: Abnormal   Collection Time    07/27/14  8:33 PM      Result Value Ref Range   Glucose-Capillary 149 (*) 70 - 99 mg/dL   Comment 1 Notify RN    BASIC METABOLIC PANEL     Status: Abnormal   Collection Time    07/28/14  6:36 AM        Result Value Ref Range   Sodium 138  137 - 147 mEq/L   Potassium 3.4 (*) 3.7 - 5.3 mEq/L   Chloride 94 (*) 96 - 112 mEq/L   CO2 33 (*) 19 - 32 mEq/L   Glucose, Bld 125 (*) 70 - 99 mg/dL   BUN 46 (*) 6 - 23 mg/dL   Creatinine, Ser 0.93  0.50 - 1.35 mg/dL   Calcium 9.4  8.4 - 10.5 mg/dL   GFR calc non Af Amer 83 (*) >90 mL/min   GFR calc Af Amer >90  >90 mL/min   Comment: (NOTE)     The eGFR has been calculated using the CKD EPI equation.     This calculation has not been validated in all clinical situations.     eGFR's persistently <90 mL/min signify possible Chronic Kidney     Disease.   Anion gap 11  5 - 15  GLUCOSE, CAPILLARY     Status: Abnormal   Collection Time    07/28/14  6:42 AM      Result Value Ref Range   Glucose-Capillary 124 (*) 70 - 99 mg/dL   Comment 1 Notify RN    GLUCOSE, CAPILLARY     Status: Abnormal   Collection Time    07/28/14 12:08 PM      Result Value Ref Range   Glucose-Capillary 145 (*) 70 - 99 mg/dL   Comment 1 Notify RN    GLUCOSE, CAPILLARY     Status: Abnormal   Collection Time    07/28/14  4:46 PM      Result  Value Ref Range   Glucose-Capillary 141 (*) 70 - 99 mg/dL   Comment 1 Notify RN    GLUCOSE, CAPILLARY     Status: Abnormal   Collection Time    07/28/14  8:34 PM      Result Value Ref Range   Glucose-Capillary 203 (*) 70 - 99 mg/dL  BASIC METABOLIC PANEL     Status: Abnormal   Collection Time    07/29/14  5:45 AM      Result Value Ref Range   Sodium 139  137 - 147 mEq/L   Potassium 3.6 (*) 3.7 - 5.3 mEq/L   Chloride 94 (*) 96 - 112 mEq/L   CO2 33 (*) 19 - 32 mEq/L   Glucose, Bld 117 (*) 70 - 99 mg/dL   BUN 49 (*) 6 - 23 mg/dL   Creatinine, Ser 1.05  0.50 - 1.35 mg/dL   Calcium 9.3  8.4 - 10.5 mg/dL   GFR calc non Af Amer 70 (*) >90 mL/min   GFR calc Af Amer 81 (*) >90 mL/min   Comment: (NOTE)     The eGFR has been calculated using the CKD EPI equation.     This calculation has not been validated in all clinical situations.     eGFR's persistently <90 mL/min signify possible Chronic Kidney     Disease.   Anion gap 12  5 - 15  GLUCOSE, CAPILLARY     Status: Abnormal   Collection Time    07/29/14  7:26 AM      Result Value Ref Range   Glucose-Capillary 131 (*) 70 - 99 mg/dL  GLUCOSE, CAPILLARY     Status: None   Collection Time    07/29/14 12:06 PM      Result Value Ref Range   Glucose-Capillary 99  70 - 99 mg/dL  GLUCOSE, CAPILLARY     Status: Abnormal     Collection Time    07/29/14  5:03 PM      Result Value Ref Range   Glucose-Capillary 130 (*) 70 - 99 mg/dL  GLUCOSE, CAPILLARY     Status: Abnormal   Collection Time    07/29/14  9:07 PM      Result Value Ref Range   Glucose-Capillary 116 (*) 70 - 99 mg/dL  GLUCOSE, CAPILLARY     Status: Abnormal   Collection Time    07/30/14  7:00 AM      Result Value Ref Range   Glucose-Capillary 155 (*) 70 - 99 mg/dL     HEENT:  , left eye with some dried drainage at the medial canthus, throat without plaques + erythema Cardio: RRR and No murmur Resp: CTA B/L and Unlabored GI: BS positive and Nontender  nondistended Extremity:  No Edema, multiple arthritic deformities both hands Heberden's nodules  Neuro: Lethargic but awakens to voice. Musc/Skel:  Other No pain with upper extremity or lower extremity range of motion, no evidence of joint swelling Gen. no acute distress   Assessment/Plan: 1. Functional deficits secondary to Gout flare and severe diabetic neuropathy which require 3+ hours per day of interdisciplinary therapy in a comprehensive inpatient rehab setting. Physiatrist is providing close team supervision and 24 hour management of active medical problems listed below. Physiatrist and rehab team continue to assess barriers to discharge/monitor patient progress toward functional and medical goals. FIM: FIM - Bathing Bathing Steps Patient Completed: Chest;Right Arm;Left Arm;Abdomen Bathing: 2: Max-Patient completes 3-4 [id number] parts or 25-49%  FIM - Upper Body Dressing/Undressing Upper body dressing/undressing steps patient completed: Thread/unthread right sleeve of pullover shirt/dresss;Thread/unthread left sleeve of pullover shirt/dress;Put head through opening of pull over shirt/dress Upper body dressing/undressing: 4: Min-Patient completed 75 plus % of tasks FIM - Lower Body Dressing/Undressing Lower body dressing/undressing steps patient completed: Pull pants up/down Lower body dressing/undressing: 1: Total-Patient completed less than 25% of tasks  FIM - Toileting Toileting steps completed by patient: Adjust clothing prior to toileting;Adjust clothing after toileting Toileting Assistive Devices: Grab bar or rail for support Toileting: 3: Mod-Patient completed 2 of 3 steps  FIM - TRadio producerDevices: Grab bars;Walker Toilet Transfers: 4-To toilet/BSC: Min A (steadying Pt. > 75%);4-From toilet/BSC: Min A (steadying Pt. > 75%)  FIM - Bed/Chair Transfer Bed/Chair Transfer Assistive Devices: Arm rests;Walker;Orthosis (L AFO (Reaction)) Bed/Chair  Transfer: 5: Bed > Chair or W/C: Supervision (verbal cues/safety issues);5: Chair or W/C > Bed: Supervision (verbal cues/safety issues)  FIM - Locomotion: Wheelchair Distance: 100 Locomotion: Wheelchair: 2: Travels 50 - 149 ft with supervision, cueing or coaxing FIM - Locomotion: Ambulation Locomotion: Ambulation Assistive Devices: Orthosis;Walker - Rolling Ambulation/Gait Assistance: 5: Supervision Locomotion: Ambulation: 5: Travels 150 ft or more with supervision/safety issues  Comprehension Comprehension Mode: Auditory Comprehension: 5-Follows basic conversation/direction: With no assist  Expression Expression Mode: Verbal Expression: 5-Expresses basic 90% of the time/requires cueing < 10% of the time.  Social Interaction Social Interaction: 2-Interacts appropriately 25 - 49% of time - Needs frequent redirection.  Problem Solving Problem Solving: 4-Solves basic 75 - 89% of the time/requires cueing 10 - 24% of the time  Memory Memory: 5-Recognizes or recalls 90% of the time/requires cueing < 10% of the time  Medical Problem List and Plan:  1. Functional deficits secondary to Gout flare and severe diabetic peripheral neuropathy.  2. DVT Prophylaxis/Anticoagulation: Pharmaceutical: Lovenox--monitor platelets with history of thrombocytopenia.  3. Pain Management: continue colchicine and prednisone.  4. Mood: NO  signs of ETOH withdrawal noted. CIWA protocol. Continue prozac. LCSW to follow for evaluation and support.  5. Neuropsych: This patient is capable of making decisions on his own behalf.  6. Skin/Wound Care: Routine pressure relief measures.  7. Fluids/Electrolytes/Nutrition: Monitor strict I/O. Offer nutritional supplements if po intake poor.  8. Chronic diastolic HF: Check daily weights. Low salt diet. Continue lasix, coreg and lipitor. Oxygen at bedtime.  9. DM type 2 with polyneuropathy: Monitor BS with ac/hs checks. Continue lantus insulin with SS coverage as anticipate  BS variation due to prednisone.  10. Alcohol abuse with gait disorder: Resume lactulose.  11. Metastatic breast cancer: Awaiting surgery.  12. Gout flare: Will taper prednisone as symptoms improving. On allopurinol in addition to colchicine.  13. Dizziness with diplopia: Has history of inner ear problems in the past. Will order vestibular rehab. Reports diplopia with near vision--question accomodation issues v/s neurologic changes. Question repeat CT head as also showing left lean with ambulation.  14. Evidence of thrush, Diflucan, pharynx red but no more plaques 15 mild allergic conjunctivitis start eyedrops 16 cerumenosis to start drops LOS (Days) 6 A FACE TO FACE EVALUATION WAS PERFORMED  , E 07/30/2014, 8:38 AM    

## 2014-07-30 NOTE — Progress Notes (Signed)
Social Work Patient ID: [name], male   DOB: 1946/01/14, 68 y.o.   MRN: [id number] Team feels family education is need due to pt getting close to reaching his goals.  Have scheduled her today at 1;00 she can't stay long due to  Four dogs at home and can';t be left for long.  Aware pt will be discharged this week, if no medical issues.

## 2014-07-30 NOTE — Progress Notes (Signed)
Occupational Therapy Session Note  Patient Details  Name: [name] MRN: [id number] Date of Birth: Mar 02, 1946  Today's Date: 07/30/2014 OT Individual Time: 1105-1208 OT Individual Time Calculation (min): 63 min    Short Term Goals: Week 1:  OT Short Term Goal 1 (Week 1): Pt will engage in 10 minutes of functional task without requiring rest break in order to increase endurance for functional tasks.  OT Short Term Goal 2 (Week 1): Pt will perform LB dressing with Mod A in order to increase I in self care. OT Short Term Goal 3 (Week 1): Pt will perform toileting with Mod A in order to increase I in self care. OT Short Term Goal 4 (Week 1): Pt will perform shower transfer with Min A in order to increase I in functional transfer.  OT Short Term Goal 5 (Week 1): Pt will perform toilet transfer with Min A in order to increase I in functional transfers.  Skilled Therapeutic Interventions/Progress Updates:    1) Engaged in ADL retraining with focus on increased independence with self-care tasks of bathing and dressing.  Pt ambulated to toilet with RW with supervision, mod encouraging to attempt tasks prior to requesting assistance.  Further discussion regarding how much assistance pt's daughter was providing with self-care tasks with pt reporting it depended on how well he was feeling that day.  Occasionally she would assist with washing back, buttocks, and feet and would assist with donning socks and shoes.  Plan to follow up with daughter about level of assist she was providing and what level she feels comfortable providing.  Min assist with sidestepping into shower.  Pt required assist with washing buttocks and BLE, recommend pt use a long handled sponge for BLE and feet.  Pt requesting assist with donning shirt and pants, encouragement provided to attempt prior to asking for assistance with pt able to don shirt min assist and thread pants with setup assist of each leg. Therapist applied TEDS and  non-skid socks and left in w/c for lunch.  2) Engaged in family education with daughter, with discussion of pt's level of independence PTA and what areas she would assist him with.  Educated on encouraging pt to attempt tasks prior to asking for assistance and encouraging him to take ownership of his recovery.  Pt's daughter reports that it is difficult to encourage and push her father to attempt things if he doesn't want to.  She reports that she can and will assist him at any level, but that "on a good day" he could do all bathing and dressing tasks.  Ambulated approx 20 feet with RW and close supervision before pt reports feeling "swimmy headed" and requested to return to room via w/c.  Pt's daughter reports this needs to be shared with SWK and MD, as she can not take him home if he "freezes" during ambulation.  Educated on vestibular findings and education on adaptation and compensation during mobility.  Informed SWK of pt's current status.  Therapy Documentation Precautions:  Precautions Precautions: None Precaution Comments: Make sure Teds are on prior to standing/gait; check skin after orthosis use Restrictions Weight Bearing Restrictions: No Pain: Pain Assessment Pain Assessment: 0-10 Pain Score: 4  Pain Location: Leg Pain Orientation: Left;Right Pain Descriptors / Indicators: Aching Patients Stated Pain Goal: 2 Pain Intervention(s): Medication (See eMAR)  See FIM for current functional status  Therapy/Group: Individual Therapy  Simonne Come 07/30/2014, 12:28 PM

## 2014-07-30 NOTE — Progress Notes (Signed)
Recreational Therapy Session Note  Patient Details  Name: HASEEB FIALLOS MRN: [id number] Date of Birth: 1946-02-13 Today's Date: 07/30/2014  Met with pt today to discuss TR services.  Upon entry, pt & daughter stating pt had an episode with previous therapy of feeling "swimmy headed" & unable to continue walking.  Daughter concerned about same type of episode happening at home, stating she would be able to mange that.  Daughter planning to speak with SW as she is exiting the rehab center. Session focused on discharge planning specifically about interests & use of leisure time.   Per team report, pt with anticipated discharge date at end of the week, therefore formal eval & treatment plan deferred at this time.  Will continue to monitor.    [place] 07/30/2014, 3:50 PM

## 2014-07-30 NOTE — Progress Notes (Signed)
Social Work Patient ID: Sharion Balloon, male   DOB: 09-27-1946, 68 y.o.   MRN: [id number] Daughter was here for education and concerned due to pt became swimming headed and she does not want him going home like this.  She states; " I will just being Him right back here."  Discussed will make MD aware and discuss tomorrow in team conference.  She is quite anxious herself and reports the pt has her do more thanhe needs done  Or which he can do on his own.  She has difficulty standing up to him though.  Discussed while in his room he needs to quit drinking and certainly not drive if he has been drinking.  He agrees with this and reports he doesn't drink and drive. Will discuss at team conference tomorrow.

## 2014-07-31 ENCOUNTER — Inpatient Hospital Stay (HOSPITAL_COMMUNITY): Payer: Medicare Other | Admitting: Occupational Therapy

## 2014-07-31 ENCOUNTER — Inpatient Hospital Stay (HOSPITAL_COMMUNITY): Payer: Medicare Other | Admitting: Physical Therapy

## 2014-07-31 ENCOUNTER — Inpatient Hospital Stay (HOSPITAL_COMMUNITY): Payer: Medicare Other

## 2014-07-31 DIAGNOSIS — R42 Dizziness and giddiness: Secondary | ICD-10-CM

## 2014-07-31 LAB — CREATININE, SERUM
Creatinine, Ser: 1.19 mg/dL (ref 0.50–1.35)
GFR calc non Af Amer: 60 mL/min — ABNORMAL LOW (ref >90)
GFR, EST AFRICAN AMERICAN: 70 mL/min — AB (ref >90)

## 2014-07-31 LAB — GLUCOSE, CAPILLARY
GLUCOSE-CAPILLARY: 134 mg/dL — AB (ref 70–99)
Glucose-Capillary: 155 mg/dL — ABNORMAL HIGH (ref 70–99)
Glucose-Capillary: 131 mg/dL — ABNORMAL HIGH (ref 70–99)
Glucose-Capillary: 185 mg/dL — ABNORMAL HIGH (ref 70–99)

## 2014-07-31 LAB — BASIC METABOLIC PANEL
Anion gap: 14 (ref 5–15)
BUN: 54 mg/dL — ABNORMAL HIGH (ref 6–23)
CALCIUM: 9.4 mg/dL (ref 8.4–10.5)
CO2: 30 mEq/L (ref 19–32)
Chloride: 94 mEq/L — ABNORMAL LOW (ref 96–112)
Creatinine, Ser: 1.21 mg/dL (ref 0.50–1.35)
GFR calc non Af Amer: 59 mL/min — ABNORMAL LOW (ref >90)
GFR, EST AFRICAN AMERICAN: 68 mL/min — AB (ref >90)
Glucose, Bld: 143 mg/dL — ABNORMAL HIGH (ref 70–99)
POTASSIUM: 4.2 meq/L (ref 3.7–5.3)
SODIUM: 138 meq/L (ref 137–147)

## 2014-07-31 NOTE — Progress Notes (Signed)
Occupational Therapy Session Note  Patient Details  Name: [name] MRN: [id number] Date of Birth: 07-21-1946  Today's Date: 07/31/2014 OT Individual Time: 1117-1202 and 1400-1445 OT Individual Time Calculation (min): 45 min and 45 min   Short Term Goals: Week 1:  OT Short Term Goal 1 (Week 1): Pt will engage in 10 minutes of functional task without requiring rest break in order to increase endurance for functional tasks.  OT Short Term Goal 2 (Week 1): Pt will perform LB dressing with Mod A in order to increase I in self care. OT Short Term Goal 3 (Week 1): Pt will perform toileting with Mod A in order to increase I in self care. OT Short Term Goal 4 (Week 1): Pt will perform shower transfer with Min A in order to increase I in functional transfer.  OT Short Term Goal 5 (Week 1): Pt will perform toilet transfer with Min A in order to increase I in functional transfers.  Skilled Therapeutic Interventions/Progress Updates:    1) Engaged in functional tranfers and grooming task with focus on increased participation and increased independence with self-care tasks.  Upon arrival, pt reports needing to toilet.  Encouraged pt to ambulate into bathroom as home bathroom not accessible via w/c, however pt refused reporting occasional "swimmy headed" and already walking with PT.  Again educated on increased activity to improve endurance and balance with pt refusing.  Stand pivot to toilet with use of grab bars and supervision.  Encouraged pt to complete perineal hygiene post toileting with pt providing excuses, set up bathroom similar to home bathroom setup and pt began to attempt wiping then stated "I can't, you do it".  Grooming tasks completed seated in w/c at sink, pt refused to engage in standing due to fear of "swimmy headed". Pt left seated in w/c waiting MRI and/or lunch tray.  2) Engaged in self-care tasks of dressing and functional mobility with RW in home environment.  Pt in bed upon  arrival in hospital gown due to having just returned from MRI.  Pt donned shirt and pants from EOB with setup assist, requiring assist only to don Lt shoe with AFO.  Ambulated with RW into bathroom and completed toilet transfer and clothing management with supervision.  Ambulated approx 75 feet from room to RN station with min verbal cues for head placement to decrease symptoms of dizziness.  Pt with no c/o dizziness this session.  Left in therapy gym awaiting PT.  Therapy Documentation Precautions:  Precautions Precautions: None Precaution Comments: Make sure Teds are on prior to standing/gait; check skin after orthosis use Restrictions Weight Bearing Restrictions: No General:   Vital Signs: Therapy Vitals Pulse Rate: 65 BP: 132/67 mmHg Patient Position (if appropriate): Lying Pain:  Pt with no c/o pain  See FIM for current functional status  Therapy/Group: Individual Therapy  Simonne Come 07/31/2014, 12:14 PM

## 2014-07-31 NOTE — Progress Notes (Signed)
Orthopedic Tech Progress Note Patient Details:  [name] [birth year] [id number] Called in order to [facility]. Patient ID: [name], male   DOB: 07/10/1946, 68 y.o.   MRN: [id number]   Darrol Poke 07/31/2014, 9:50 AM

## 2014-07-31 NOTE — Progress Notes (Signed)
Social Work Elease Hashimoto, LCSW Social Worker Signed  Patient Care Conference Service date: 07/31/2014 2:19 PM  Inpatient RehabilitationTeam Conference and Plan of Care Update Date: 07/31/2014   Time: 10;30 AM     Patient Name: [name]       Medical Record Number: [id number]   Date of Birth: Oct 02, 1946 Sex: Male         Room/Bed: 4W05C/4W05C-01 Payor Info: Payor: MEDICARE / Plan: MEDICARE PART A AND B / Product Type: *No Product type* /   Admitting Diagnosis: Polyarticular gout   Admit Date/Time:  07/24/2014  5:30 PM Admission Comments: No comment available   Primary Diagnosis:  <principal problem not specified> Principal Problem: <principal problem not specified>    Patient Active Problem List     Diagnosis  Date Noted   .  Osteoarthritis of both hands  07/30/2014   .  Thrush, oral  07/28/2014   .  Debility  07/25/2014   .  Diabetic polyneuropathy associated with type 2 diabetes mellitus  07/25/2014   .  Gout flare  07/24/2014   .  Acute gout  07/22/2014   .  CHF exacerbation  07/21/2014   .  Pre-operative cardiovascular examination  07/15/2014   .  Family history of malignant neoplasm of gastrointestinal tract  06/12/2014   .  Male breast cancer  06/12/2014   .  Cancer of male breast, right  05/29/2014   .  Acute on chronic renal failure  04/29/2014   .  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  Acute on chronic systolic and diastolic heart failure, NYHA class 4  04/20/2014   .  Diabetes mellitus  01/28/2014   .  Panniculitis  04/14/2013   .  Acute on chronic diastolic heart failure  37/16/9678   .  Seasonal allergies  12/20/2012   .  L1 vertebral fracture  09/07/2012   .  Acute renal failure  08/30/2012   .  Hypothyroidism  08/30/2012   .  ETOH abuse     .  Atrial fibrillation     .  Chronic diastolic heart failure     .  DM (diabetes mellitus) type II uncontrolled, periph vascular disorder  06/20/2009   .  Obstructive  sleep apnea  06/20/2009   .  CAROTID STENOSIS  06/20/2009   .  CHRONIC OBSTRUCTIVE PULMONARY DISEASE  06/20/2009   .  CAD  06/20/2009   .  Hypderlipidemia     .  Hypertensive heart disease       Expected Discharge Date: Expected Discharge Date: 08/02/14  Team Members Present: Physician leading conference: Dr. Alysia Penna Social Worker Present: Ovidio Kin, LCSW Nurse Present: Heather Roberts, RN PT Present: Raylene Everts, PT;Caroline Lacinda Axon, PT;Blair Hobble, PT OT Present: Simonne Come, OT SLP Present: Windell Moulding, SLP PPS Coordinator present : Daiva Nakayama, RN, CRRN        Current Status/Progress  Goal  Weekly Team Focus   Medical     pt with dizziness acute and chronic  complete eval of gait disorder   MRI, Neuropsych eval   Bowel/Bladder     cont of bowel with lactulose BID; cont of bladder requires level 4 assist  cont of bowel with mod I assist and bladder with BSC  enc pt to use toilet when possible and do for himself; freq ask staff to hold urinal for him   Swallow/Nutrition/ Hydration  Regular diet       ADL's     supervision to min assist transfers, mod assist bathing, min assist UB dressing, total assist LB dressing  supervision overall, mod assist LB dressing  family education, functional mobility, use of AE and DME to increase independence with self-care tasks   Mobility     Supervision to Min A with transfers, gait, and stair negotiation  Supervision overall  Family education/training, obtain L AFO, stairs, dynamic standing balance, functional mobility in home environment   Communication     [facility]       Safety/Cognition/ Behavioral Observations    no unsafe behaviors       Pain     pain min controlled with oxy and neurontin. tylenol prn headaches; debrox for earaches and gtts for eye issue. voltaren gel for arthritis in hands and gout improved  Pain maintained with scheduled medications and limited prn medications; pt want meds freq and notes oxy only good for leg  cramps  monitor pain and use of prn and scheduled medications   Skin     periarea and scrotum reddened/? yeast to area; using moisture barrier cream and antifungal powder; difflucan administered as ordered  MASD areas and yeast iimproving  keep skin dry; use cream and powder after toileting and BID     *See Care Plan and progress notes for long and short-term goals.    Barriers to Discharge:  see above      Possible Resolutions to Barriers:    cont rehab, family ed      Discharge Planning/Teaching Needs:    Home with daughter to assist, some family education completed yesterday.  Her main concern is he doesn't go home before he is ready      Team Discussion:    Balance issues-dizziness yesterday checking with MRI today.  Gait disorder attributed to ETOH and neuropathy.  Wants more pain meds.  Neuro-psych to see regarding substance abuse issues.  Family education yesterday with daughter needs more   Revisions to Treatment Plan:    None    Continued Need for Acute Rehabilitation Level of Care: The patient requires daily medical management by a physician with specialized training in physical medicine and rehabilitation for the following conditions: Daily direction of a multidisciplinary physical rehabilitation program to ensure safe treatment while eliciting the highest outcome that is of practical value to the patient.: Yes Daily analysis of laboratory values and/or radiology reports with any subsequent need for medication adjustment of medical intervention for : Neurological problems;Other  Elease Hashimoto 07/31/2014, 2:20 PM          Patient ID: Sharion Balloon, male   DOB: 10/05/1946, 68 y.o.   MRN: [id number]

## 2014-07-31 NOTE — Consult Note (Signed)
Referring Physician: Kirsteins    Chief Complaint: dizziness  HPI:                                                                                                                                         [name] is an 68 y.o. male with HTN, CAF, ongoing alcohol abuse, chronic pain, who was admitted on 07/21/14 with fatigue, increased weakness and BLE edema. He was treated with IV lasix due to complaints of SOB and Dr. Haroldine Laws consulted for input. CT head with moderate generalized atrophy with progression since 2011. He felt that volume status was stable but patient with polyarticular gout flare in setting of ETOH abuse and added steroids for treatment. While in hospital HE has been complaining of feeling off balance and while on rehab noted he felt as though he was drifting to the left while he walked.  He notes he feels light headed if he looks sown while walking.  He denies any diplopia, blurred vision, dysarthria. Patient has known Afib but has not been placed on AC due to fall risk and on going ETOH abuse.  Neurology was asked to evaluate.   Date last known well: Unable to determine Time last known well: Unable to determine tPA Given: No: out of window  Past Medical History  Diagnosis Date  . CHRONIC OBSTRUCTIVE PULMONARY DISEASE 06/20/2009  . OBSTRUCTIVE SLEEP APNEA 06/20/2009  . CAROTID STENOSIS 06/20/2009    A. 08/2001 s/p L CEA;  B.   09/14/11 - Carotid U/S - 40-59% bilateral stenosis, left CEA patch angioplasty is patent  . DM 06/20/2009  . CAD 06/20/2009    A.  08/2000 - s/p CABG x 4 - LIMA-LAD, Left Radial-OM, VG-DIAG, VG-RCA;  B. Neg. MV  2010  . HYPERLIPIDEMIA 06/20/2009  . HYPERTENSION 06/20/2009  . Hypothyroidism   . Low back pain   . Asthma     as child  . Pneumonia   . Atrial fibrillation     Not felt to be coumadin candidate 2/2 ETOH use.  Marland Kitchen ETOH abuse   . History of tobacco abuse     remote - quit 1970  . Bilateral renal cysts   . Marijuana abuse   . Morbidly  obese   . CHF (congestive heart failure)   . Falls frequently   . Hx of cardiovascular stress test 05/2014    Lexiscan Myoview (8/15):  No ischemia; EF 63% - Normal Study  . Breast cancer, right breast   . Gout     Past Surgical History  Procedure Laterality Date  . Carotid endarterectomy  2002    left  . Coronary artery bypass graft      x 4 - 2001  . Umbilical hernia repair N/A 01/28/2014    Procedure: HERNIA REPAIR UMBILICAL ADULT/INC;  Surgeon: Harl Bowie, MD;  Location: [place];  Service: General;  Laterality: N/A;  .  Laparotomy N/A 01/28/2014    Procedure: EXPLORATORY LAPAROTOMY;  Surgeon: Harl Bowie, MD;  Location: [place];  Service: General;  Laterality: N/A;  . Bowel resection N/A 01/28/2014    Procedure: SMALL BOWEL RESECTION;  Surgeon: Harl Bowie, MD;  Location: [place];  Service: General;  Laterality: N/A;  . Hernia repair      Family History  Problem Relation Age of Onset  . Stroke Mother     ?  Marland Kitchen Cancer Mother     unknown type of cancer  . Stroke Father     ?  Marland Kitchen Heart attack Neg Hx   . Cancer Brother 37    stomach cancer   Social History:  reports that he quit smoking about 45 years ago. He has never used smokeless tobacco. He reports that he drinks alcohol. He reports that he uses illicit drugs (Marijuana).  Allergies:  Allergies  Allergen Reactions  . Erythromycin Hives    Medications:                                                                                                                           Current Facility-Administered Medications  Medication Dose Route Frequency Provider Last Rate Last Dose  . acetaminophen (TYLENOL) tablet 325-650 mg  325-650 mg Oral Q4H PRN Bary Leriche, PA-C   325 mg at 07/30/14 1633  . allopurinol (ZYLOPRIM) tablet 300 mg  300 mg Oral Daily Bary Leriche, PA-C   300 mg at 07/31/14 5732  . alum & mag hydroxide-simeth (MAALOX/MYLANTA) 200-200-20 MG/5ML suspension 30 mL  30 mL Oral Q4H PRN Bary Leriche, PA-C      . aspirin chewable tablet 81 mg  81 mg Oral Daily Bary Leriche, PA-C   81 mg at 07/31/14 2025  . atorvastatin (LIPITOR) tablet 20 mg  20 mg Oral Daily Bary Leriche, PA-C   20 mg at 07/31/14 4270  . bisacodyl (DULCOLAX) suppository 10 mg  10 mg Rectal Daily PRN Bary Leriche, PA-C      . carbamide peroxide (DEBROX) 6.5 % otic solution 5 drop  5 drop Both Ears BID Charlett Blake, MD   5 drop at 07/31/14 0825  . carvedilol (COREG) tablet 3.125 mg  3.125 mg Oral BID WC Ivan Anchors Love, PA-C   3.125 mg at 07/31/14 0827  . colchicine tablet 0.6 mg  0.6 mg Oral BID Bary Leriche, PA-C   0.6 mg at 07/31/14 6237  . diclofenac sodium (VOLTAREN) 1 % transdermal gel 2 g  2 g Topical QID Charlett Blake, MD   2 g at 07/31/14 1523  . enoxaparin (LOVENOX) injection 40 mg  40 mg Subcutaneous Q24H Bary Leriche, PA-C   40 mg at 07/30/14 2154  . fluconazole (DIFLUCAN) tablet 100 mg  100 mg Oral Daily Charlett Blake, MD   100 mg at 07/31/14 0824  . FLUoxetine (PROZAC) capsule 20 mg  20 mg  Oral Daily Bary Leriche, PA-C   20 mg at 07/31/14 0086  . fluticasone (FLOVENT HFA) 110 MCG/ACT inhaler 1 puff  1 puff Inhalation BID Bary Leriche, PA-C   1 puff at 07/31/14 0751  . folic acid (FOLVITE) tablet 1 mg  1 mg Oral Daily Bary Leriche, PA-C   1 mg at 07/31/14 7619  . furosemide (LASIX) tablet 160 mg  160 mg Oral 2 times per day Charlett Blake, MD   160 mg at 07/31/14 1523  . gabapentin (NEURONTIN) capsule 300 mg  300 mg Oral BID Charlett Blake, MD   300 mg at 07/31/14 5093  . guaiFENesin-dextromethorphan (ROBITUSSIN DM) 100-10 MG/5ML syrup 5-10 mL  5-10 mL Oral Q6H PRN Bary Leriche, PA-C      . insulin aspart (novoLOG) injection 0-9 Units  0-9 Units Subcutaneous TID WC Bary Leriche, PA-C   1 Units at 07/31/14 1225  . insulin aspart (novoLOG) injection 3 Units  3 Units Subcutaneous TID WC Bary Leriche, PA-C   3 Units at 07/31/14 [phone]  . insulin glargine (LANTUS) injection 8 Units  8  Units Subcutaneous QHS Bary Leriche, PA-C   8 Units at 07/30/14 2212  . lactulose (CHRONULAC) 10 GM/15ML solution 20 g  20 g Oral BID Bary Leriche, PA-C   20 g at 07/31/14 2458  . levothyroxine (SYNTHROID, LEVOTHROID) tablet 187 mcg  187 mcg Oral QAC breakfast Bary Leriche, PA-C   187 mcg at 07/31/14 0805  . multivitamin with minerals tablet 1 tablet  1 tablet Oral Daily Bary Leriche, PA-C   1 tablet at 07/31/14 0998  . naphazoline (NAPHCON) 0.1 % ophthalmic solution 1 drop  1 drop Left Eye QID PRN Charlett Blake, MD   1 drop at 07/30/14 2155  . oxyCODONE (Oxy IR/ROXICODONE) immediate release tablet 10 mg  10 mg Oral Q6H PRN Bary Leriche, PA-C   10 mg at 07/31/14 0846  . pantoprazole (PROTONIX) EC tablet 40 mg  40 mg Oral Daily Bary Leriche, PA-C   40 mg at 07/31/14 3382  . potassium chloride SA (K-DUR,KLOR-CON) CR tablet 20 mEq  20 mEq Oral Daily Bary Leriche, PA-C   20 mEq at 07/31/14 5053  . prochlorperazine (COMPAZINE) tablet 5-10 mg  5-10 mg Oral Q6H PRN Bary Leriche, PA-C       Or  . prochlorperazine (COMPAZINE) injection 5-10 mg  5-10 mg Intramuscular Q6H PRN Bary Leriche, PA-C       Or  . prochlorperazine (COMPAZINE) suppository 12.5 mg  12.5 mg Rectal Q6H PRN Ivan Anchors Love, PA-C         ROS:                                                                                                                                       History obtained  from the patient  General ROS: negative for - chills, fatigue, fever, night sweats, weight gain or weight loss Psychological ROS: negative for - behavioral disorder, hallucinations, memory difficulties, mood swings or suicidal ideation Ophthalmic ROS: negative for - blurry vision, double vision, eye pain or loss of vision ENT ROS: negative for - epistaxis, nasal discharge, oral lesions, sore throat, tinnitus or vertigo Allergy and Immunology ROS: negative for - hives or itchy/watery eyes Hematological and Lymphatic ROS: negative for -  bleeding problems, bruising or swollen lymph nodes Endocrine ROS: negative for - galactorrhea, hair pattern changes, polydipsia/polyuria or temperature intolerance Respiratory ROS: negative for - cough, hemoptysis, shortness of breath or wheezing Cardiovascular ROS: negative for - chest pain, dyspnea on exertion, edema or irregular heartbeat Gastrointestinal ROS: negative for - abdominal pain, diarrhea, hematemesis, nausea/vomiting or stool incontinence Genito-Urinary ROS: negative for - dysuria, hematuria, incontinence or urinary frequency/urgency Musculoskeletal ROS: negative for - joint swelling or muscular weakness Neurological ROS: as noted in HPI Dermatological ROS: negative for rash and skin lesion changes  Physical exam: pleasant male in no apparent distress. Blood pressure 114/64, pulse 68, temperature 97.7 F (36.5 C), temperature source Oral, resp. rate 18, height 5' 7.5" (1.715 m), weight 109.9 kg (242 lb 4.6 oz), SpO2 92.00%. Head: normocephalic. Neck: supple, no bruits, no JVD. Cardiac: no murmurs. Lungs: clear. Abdomen: soft, no tender, no mass. Extremities: no edema. Neurologic Examination:                                                                                                      Blood pressure 114/64, pulse 68, temperature 97.7 F (36.5 C), temperature source Oral, resp. rate 18, height 5' 7.5" (1.715 m), weight 109.9 kg (242 lb 4.6 oz), SpO2 92.00%.  General: NAD Mental Status: Alert, oriented, thought content appropriate.  Speech fluent without evidence of aphasia.  Able to follow 3 step commands without difficulty. Cranial Nerves: II: Discs flat bilaterally; Visual fields grossly normal, pupils equal, round, reactive to light and accommodation III,IV, VI: ptosis not present, extra-ocular motions intact bilaterally V,VII: smile symmetric, facial light touch sensation normal bilaterally VIII: hearing normal bilaterally IX,X: gag reflex present XI: bilateral  shoulder shrug XII: midline tongue extension without atrophy or fasciculations  Motor: Right : Upper extremity   5/5    Left:     Upper extremity   5/5  Lower extremity   5/5     Lower extremity   5/5 Tone and bulk:normal tone throughout; no atrophy noted Sensory: Pinprick and light touch intact throughout, bilaterally Deep Tendon Reflexes:  Right: Upper Extremity   Left: Upper extremity   biceps (C-5 to C-6) 2/4   biceps (C-5 to C-6) 2/4 tricep (C7) 2/4    triceps (C7) 2/4 Brachioradialis (C6) 2/4  Brachioradialis (C6) 2/4  Lower Extremity Lower Extremity  quadriceps (L-2 to L-4) 0/4   quadriceps (L-2 to L-4) 0/4 Achilles (S1) 0/4   Achilles (S1) 0/4  Plantars: mute Cerebellar: normal finger-to-nose,  normal heel-to-shin test Gait: wide based with sensory ataxia.  Needs to use walker for stability CV:  pulses palpable throughout    Lab Results: Basic Metabolic Panel:  Recent Labs Lab 07/26/14 0525 07/27/14 0505 07/28/14 0636 07/29/14 0545 07/31/14 0700  NA 136* 137 138 139 138  K 3.8 3.7 3.4* 3.6* 4.2  CL 93* 93* 94* 94* 94*  CO2 33* 33* 33* 33* 30  GLUCOSE 160* 156* 125* 117* 143*  BUN 49* 45* 46* 49* 54*  CREATININE 0.92 0.92 0.93 1.05 1.19  1.21  CALCIUM 9.4 9.2 9.4 9.3 9.4    Liver Function Tests:  Recent Labs Lab 07/25/14 0500  AST 17  ALT 10  ALKPHOS 59  BILITOT 0.6  PROT 7.3  ALBUMIN 3.1*   No results found for this basename: LIPASE, AMYLASE,  in the last 168 hours  Recent Labs Lab 07/25/14 0500  AMMONIA 48    CBC:  Recent Labs Lab 07/25/14 0500 07/27/14 1135  WBC 7.0 6.7  NEUTROABS 4.1 4.4  HGB 12.9* 12.2*  HCT 39.7 37.5*  MCV 95.4 94.9  PLT 148* 137*    Cardiac Enzymes: No results found for this basename: CKTOTAL, CKMB, CKMBINDEX, TROPONINI,  in the last 168 hours  Lipid Panel: No results found for this basename: CHOL, TRIG, HDL, CHOLHDL, VLDL, LDLCALC,  in the last 168 hours  CBG:  Recent Labs Lab 07/30/14 1212  07/30/14 1635 07/30/14 2130 07/31/14 0738 07/31/14 1209  GLUCAP 167* 104* 141* 155* 131*    Microbiology: Results for orders placed during the hospital encounter of 01/28/14  SURGICAL PCR SCREEN     Status: None   Collection Time    01/28/14  8:13 AM      Result Value Ref Range Status   MRSA, PCR NEGATIVE  NEGATIVE Final   Staphylococcus aureus NEGATIVE  NEGATIVE Final   Comment:            The Xpert SA Assay (FDA     approved for NASAL specimens     in patients over 59 years of age),     is one component of     a comprehensive surveillance     program.  Test performance has     been validated by [employer] for patients greater     than or equal to 77 year old.     It is not intended     to diagnose infection nor to     guide or monitor treatment.    Coagulation Studies: No results found for this basename: LABPROT, INR,  in the last 72 hours  Imaging: Mr Virgel Paling Wo Contrast  07/31/2014   CLINICAL DATA:  Dizziness, recent frequent falls, generalized weakness. Alcohol abuse. Metastatic breast cancer. Diabetes mellitus.  EXAM: MRI HEAD WITHOUT CONTRAST  MRA HEAD WITHOUT CONTRAST  TECHNIQUE: Multiplanar, multiecho pulse sequences of the brain and surrounding structures were obtained without intravenous contrast. Angiographic images of the head were obtained using MRA technique without contrast.  COMPARISON:  CT head 07/21/2014.  FINDINGS: MRI HEAD FINDINGS  There is a moderate-sized area of acute infarction affecting the LEFT inferior cerebellum. Laterally there is largely confluent but scattered focal areas of restricted diffusion lying within the LEFT posterior inferior cerebellar artery territory. Medially, also involving the tonsil, there is susceptibility artifact as seen on gradient, T2, and diffusion sequence consistent with hemorrhagic transformation. This should be monitored carefully with CT. There is no shift of the fourth ventricle or significant deformity to imply  impending hydrocephalus. No tonsillar herniation.  Generalized cerebral and cerebellar atrophy  is advanced for the patient's age. There is extensive T2 and FLAIR hyperintensity representing chronic microvascular ischemic change. There is an old LEFT frontal infarct. Tiny foci of chronic hemorrhage can be seen elsewhere in the brain, likely sequelae of hypertension.  Flow voids are maintained in the carotid, basilar, and LEFT vertebral arteries. The RIGHT vertebral appears to end in PICA. The LEFT vertebral appears moderately diseased, described below.  No midline abnormality. No sinus disease. Negative orbits. No mastoid fluid. Negative osseous structures.  MRA HEAD FINDINGS  The internal carotid arteries demonstrate mild irregularity in their cavernous segments. BILATERAL ICA termini are widely patent.  Basilar artery is widely patent with LEFT vertebral a sole contributor.  There is diminished flow related enhancement in the LEFT vertebral particularly in its distal V4 segment. There is considerable motion degradation, but it appears likely that there is focal plaque or localized dissection within the LEFT vertebral. This is also suggested on the gradient sequence (images 24-28 series 9). Other than a short stump representing its origin (image 32 series 8), there is no appreciable flow related enhancement in the LEFT PICA.  No proximal stenosis of the middle cerebral arteries. Atretic or severely diseased A1 ACA on the RIGHT. Azygos LEFT ACA supplies both anterior cerebrals. 75% stenosis of the distal P1 segment RIGHT PCA. There is prominence at the origin of the LEFT posterior communicating artery. I suspect this represents an infundibulum.  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 Recommend continued surveillance with CT scanning to exclude expanding posterior fossa hematoma.  LEFT PICA thrombosis. Suspect adjacent LEFT  vertebral dissection or focal plaque.  Generalized atrophy with small vessel disease.  Findings discussed with ordering provider.   Electronically Signed   By: Rolla Flatten M.D.   On: 07/31/2014 14:56   Echo 7.12.2015  Study Conclusions  - Left ventricle: The cavity size was normal. Wall thickness was normal. Systolic function was normal. The estimated ejection fraction was in the range of 50% to 55%. Doppler parameters are consistent with both elevated ventricular end-diastolic filling pressure and elevated left atrial filling pressure. - Mitral valve: Calcified annulus. Mildly thickened leaflets . - Left atrium: Moderately to severely dilated. - Right ventricle: The cavity size was moderately dilated. - Right atrium: The atrium was moderately dilated. - Atrial septum: No defect or patent foramen ovale was identified.   Carotid duplex 5.15.2015 Technically very difficult study secondary to high bifurcations, depth of vessels, and shadowing from extensive calcified plaque. Progression of bilateral ICA stenosis, now in the 60-79% range. >50% RECA stenosis. Dilated internal jugular veins. Patent vertebral arteries with antegrade flow. Normal subclavian arteries, bilaterally.  Assessment and plan discussed with with attending physician and they are in agreement.    Etta Quill PA-C [facility] [phone]  07/31/2014, 3:41 PM   Assessment: 68 y.o. male with acute moderate-sized infarction affecting the LEFT inferior  cerebellum, with hemorrhagic transformation medially. There is no mass effect or hydrocephalus. MRA brain: PICA thrombosis with concern of adjacent left vertebral dissection or focal plaque. Patient mental status is intact and except for ataxia has no other striking findings on neuro-exam. He obviously not a candidate for endovascular intervention at this moment.  Stroke Risk Factors - age, atrial fibrillation, carotid stenosis, diabetes mellitus,  hyperlipidemia and hypertension.  Recommend:  1) Will hold aspirin due to presence of hemorrhagic transformation. 2) Follow up CT brain in am to ensure no worsening HT, hydrocephalus, or mass effect 3) A1c  and LDL 3) Stroke team to follow in AM  Patient seen and examined together with physician assistant and I concur with the assessment and plan.  Dorian Pod, MD

## 2014-07-31 NOTE — Progress Notes (Signed)
Patient ID: [name], male   DOB: 02/23/1946, 68 y.o.   MRN: [id number]   Subjective/Complaints: 68 y.o. male with history of OSA, HTN, CAF, ongoing alcohol abuse, chronic pain; who was admitted on 07/21/14 with fatigue, increased weakness and BLE edema. He was treated with IV lasix due to complaints of SOB and Dr. Haroldine Laws consulted for input. CT head with moderate generalized atrophy with progression since 2011. He felt that volume status was stable but patient with polyarticular gout flare in setting of ETOH abuse and added steroids for treatment. Uric acid 11.1 with TSH 5.74. X rays lumbar spine with extensive spondylosis and osteoarthritic change with diffuse idiopathic skeletal hyperostosis. Therapy evaluation done and patient with significant balance deficits with ataxia as well as difficulty with mobility due to gout flare  Pt without new issues, eyes better with drops, getting debrox for ears Still with gait imbalance, vestibular eval per PT neg Gives hx of fall with head trauma prior to admit, CT showing generalized atrophy Hand arthritis bothering him more today   Review of systems is negative except as above. He does have bilateral foot drop noted worse on the left side Objective: Vital Signs: Blood pressure 109/48, pulse 65, temperature 97.5 F (36.4 C), temperature source Oral, resp. rate 18, height 5' 7.5" (1.715 m), weight 109.9 kg (242 lb 4.6 oz), SpO2 98.00%. No results found. Results for orders placed during the hospital encounter of 07/24/14 (from the past 72 hour(s))  GLUCOSE, CAPILLARY     Status: Abnormal   Collection Time    07/28/14 12:08 PM      Result Value Ref Range   Glucose-Capillary 145 (*) 70 - 99 mg/dL   Comment 1 Notify RN    GLUCOSE, CAPILLARY     Status: Abnormal   Collection Time    07/28/14  4:46 PM      Result Value Ref Range   Glucose-Capillary 141 (*) 70 - 99 mg/dL   Comment 1 Notify RN    GLUCOSE, CAPILLARY     Status: Abnormal   Collection Time    07/28/14  8:34 PM      Result Value Ref Range   Glucose-Capillary 203 (*) 70 - 99 mg/dL  BASIC METABOLIC PANEL     Status: Abnormal   Collection Time    07/29/14  5:45 AM      Result Value Ref Range   Sodium 139  137 - 147 mEq/L   Potassium 3.6 (*) 3.7 - 5.3 mEq/L   Chloride 94 (*) 96 - 112 mEq/L   CO2 33 (*) 19 - 32 mEq/L   Glucose, Bld 117 (*) 70 - 99 mg/dL   BUN 49 (*) 6 - 23 mg/dL   Creatinine, Ser 1.05  0.50 - 1.35 mg/dL   Calcium 9.3  8.4 - 10.5 mg/dL   GFR calc non Af Amer 70 (*) >90 mL/min   GFR calc Af Amer 81 (*) >90 mL/min   Comment: (NOTE)     The eGFR has been calculated using the CKD EPI equation.     This calculation has not been validated in all clinical situations.     eGFR's persistently <90 mL/min signify possible Chronic Kidney     Disease.   Anion gap 12  5 - 15  GLUCOSE, CAPILLARY     Status: Abnormal   Collection Time    07/29/14  7:26 AM      Result Value Ref Range   Glucose-Capillary 131 (*) 70 -  99 mg/dL  GLUCOSE, CAPILLARY     Status: None   Collection Time    07/29/14 12:06 PM      Result Value Ref Range   Glucose-Capillary 99  70 - 99 mg/dL  GLUCOSE, CAPILLARY     Status: Abnormal   Collection Time    07/29/14  5:03 PM      Result Value Ref Range   Glucose-Capillary 130 (*) 70 - 99 mg/dL  GLUCOSE, CAPILLARY     Status: Abnormal   Collection Time    07/29/14  9:07 PM      Result Value Ref Range   Glucose-Capillary 116 (*) 70 - 99 mg/dL  GLUCOSE, CAPILLARY     Status: Abnormal   Collection Time    07/30/14  7:00 AM      Result Value Ref Range   Glucose-Capillary 155 (*) 70 - 99 mg/dL  GLUCOSE, CAPILLARY     Status: Abnormal   Collection Time    07/30/14 12:12 PM      Result Value Ref Range   Glucose-Capillary 167 (*) 70 - 99 mg/dL  GLUCOSE, CAPILLARY     Status: Abnormal   Collection Time    07/30/14  4:35 PM      Result Value Ref Range   Glucose-Capillary 104 (*) 70 - 99 mg/dL  GLUCOSE, CAPILLARY     Status:  Abnormal   Collection Time    07/30/14  9:30 PM      Result Value Ref Range   Glucose-Capillary 141 (*) 70 - 99 mg/dL   Comment 1 Notify RN    CREATININE, SERUM     Status: Abnormal   Collection Time    07/31/14  7:00 AM      Result Value Ref Range   Creatinine, Ser 1.19  0.50 - 1.35 mg/dL   GFR calc non Af Amer 60 (*) >90 mL/min   GFR calc Af Amer 70 (*) >90 mL/min   Comment: (NOTE)     The eGFR has been calculated using the CKD EPI equation.     This calculation has not been validated in all clinical situations.     eGFR's persistently <90 mL/min signify possible Chronic Kidney     Disease.  GLUCOSE, CAPILLARY     Status: Abnormal   Collection Time    07/31/14  7:38 AM      Result Value Ref Range   Glucose-Capillary 155 (*) 70 - 99 mg/dL     HEENT:  , left eye with some dried drainage at the medial canthus, throat without plaques + erythema Cardio: RRR and No murmur Resp: CTA B/L and Unlabored GI: BS positive and Nontender nondistended Extremity:  No Edema, multiple arthritic deformities both hands Heberden's nodules  Neuro: Lethargic but awakens to voice. Musc/Skel:  Other No pain with upper extremity or lower extremity range of motion, no evidence of joint swelling Gen. no acute distress   Assessment/Plan: 1. Functional deficits secondary to Gout flare and severe diabetic neuropathy which require 3+ hours per day of interdisciplinary therapy in a comprehensive inpatient rehab setting. Physiatrist is providing close team supervision and 24 hour management of active medical problems listed below. Physiatrist and rehab team continue to assess barriers to discharge/monitor patient progress toward functional and medical goals. FIM: FIM - Bathing Bathing Steps Patient Completed: Chest;Right Arm;Left Arm;Abdomen;Right upper leg;Left upper leg;Front perineal area Bathing: 3: Mod-Patient completes 5-7 25f10 parts or 50-74%  FIM - Upper Body Dressing/Undressing Upper body  dressing/undressing  steps patient completed: Thread/unthread right sleeve of pullover shirt/dresss;Thread/unthread left sleeve of pullover shirt/dress;Put head through opening of pull over shirt/dress Upper body dressing/undressing: 4: Min-Patient completed 75 plus % of tasks FIM - Lower Body Dressing/Undressing Lower body dressing/undressing steps patient completed: Pull pants up/down Lower body dressing/undressing: 1: Total-Patient completed less than 25% of tasks  FIM - Toileting Toileting steps completed by patient: Adjust clothing prior to toileting;Adjust clothing after toileting Toileting Assistive Devices: Grab bar or rail for support Toileting: 3: Mod-Patient completed 2 of 3 steps  FIM - Radio producer Devices: Grab bars;Walker Toilet Transfers: 4-To toilet/BSC: Min A (steadying Pt. > 75%);4-From toilet/BSC: Min A (steadying Pt. > 75%)  FIM - Bed/Chair Transfer Bed/Chair Transfer Assistive Devices: Arm rests;Walker;Orthosis (L AFO (WalkOn)) Bed/Chair Transfer: 4: Bed > Chair or W/C: Min A (steadying Pt. > 75%);4: Chair or W/C > Bed: Min A (steadying Pt. > 75%)  FIM - Locomotion: Wheelchair Distance: 100 Locomotion: Wheelchair: 1: Total Assistance/staff pushes wheelchair (Pt<25%) FIM - Locomotion: Ambulation Locomotion: Ambulation Assistive Devices: Orthosis;[place];Other (comment) (L AFO Gershon Crane)) Ambulation/Gait Assistance: 4: Min guard;4: Min assist Locomotion: Ambulation: 1: Travels less than 50 ft with minimal assistance (Pt.>75%)  Comprehension Comprehension Mode: Auditory Comprehension: 5-Follows basic conversation/direction: With extra time/assistive device  Expression Expression Mode: Verbal Expression: 5-Expresses basic 90% of the time/requires cueing < 10% of the time.  Social Interaction Social Interaction: 2-Interacts appropriately 25 - 49% of time - Needs frequent redirection.  Problem Solving Problem Solving:  4-Solves basic 75 - 89% of the time/requires cueing 10 - 24% of the time  Memory Memory: 5-Recognizes or recalls 90% of the time/requires cueing < 10% of the time  Medical Problem List and Plan:  1. Functional deficits secondary to Gout flare and severe diabetic peripheral neuropathy.  2. DVT Prophylaxis/Anticoagulation: Pharmaceutical: Lovenox--monitor platelets with history of thrombocytopenia.  3. Pain Management: continue colchicine and prednisone.  4. Mood: NO signs of ETOH withdrawal noted. CIWA protocol. Continue prozac. LCSW to follow for evaluation and support.  5. Neuropsych: This patient is capable of making decisions on his own behalf.  6. Skin/Wound Care: Routine pressure relief measures.  7. Fluids/Electrolytes/Nutrition: Monitor strict I/O. Offer nutritional supplements if po intake poor.  8. Chronic diastolic HF: Check daily weights. Low salt diet. Continue lasix, coreg and lipitor. Oxygen at bedtime.  9. DM type 2 with polyneuropathy: Monitor BS with ac/hs checks. Continue lantus insulin with SS coverage as anticipate BS variation due to prednisone.  10. Alcohol abuse with gait disorder: Resume lactulose.  11. Metastatic breast cancer: Awaiting surgery.  12. Gout flare: Will taper prednisone as symptoms improving. On allopurinol in addition to colchicine.  13. Dizziness with diplopia: Has history of inner ear problems in the past. Will check MRI brain given persistent problems, hx breast CA, 14. Evidence of thrush, Diflucan, pharynx red but no more plaques 15 mild allergic conjunctivitis start eyedrops 16 cerumenosis to start drops LOS (Days) 7 A FACE TO FACE EVALUATION WAS PERFORMED  Deboraha Goar E 07/31/2014, 8:22 AM

## 2014-07-31 NOTE — Progress Notes (Signed)
Patient with complaints of dizziness/room spinning while in chair this am. He has had ongoing symptoms PTA. Vestibular eval negative. MRI brain ordered to rule out neurologic source. Orthostatic BP ordered. Renal status stable. Discussed patient's ongoing dizziness symptoms with Dr. Sung Amabile.  He does not feel symptoms cardiac related as patient without med changes but he will follow up on orthostatic readings--OK to hold lasix dose X 1 if BP drops. Will monitor for now.

## 2014-07-31 NOTE — Progress Notes (Signed)
Social Work Patient ID: [name], male   DOB: 12-22-1945, 68 y.o.   MRN: [id number] Met with pt and spoke with daughter via telephone to discuss team conference goals of supervision level and discharge 10/23.  Daughter aware needs more family education prior to discharge Friday.   She is agreeable to this.  She will be here Friday 10;00.  Will make follow up arrangements and discharge Friday.

## 2014-07-31 NOTE — Progress Notes (Signed)
Physical Therapy Session Note  Patient Details  Name: [name] MRN: [id number] Date of Birth: 1946-03-16  Today's Date: 07/31/2014 PT Individual Time: [phone] and 1450-1520 PT Individual Time Calculation (min): 60 min and 30 min  Short Term Goals: Week 1:  PT Short Term Goal 1 (Week 1): Pt will perform supine>sit with supervision with HOB flat without rail. PT Short Term Goal 2 (Week 1): Pt will transfer from bed<>chair with min A with 25% cueing for sequencing and technique. PT Short Term Goal 3 (Week 1): Pt will perform static standing x1 minute with supervision and no LOB. PT Short Term Goal 4 (Week 1): Pt will ambulate x100' in controlled environment with LRAD and Mod A of single therapist. PT Short Term Goal 5 (Week 1): Pt will perform w/c mobility x100' consecutively without rest breaks with supervision.  Skilled Therapeutic Interventions/Progress Updates:    Treatment Session 1: Pt received semi reclined in bed; agreeable to participating in therapy session after RN brings pain medication. Educated pt that RN can locate this therapist and bring pain medication. With coaxing, pt agreeable to get OOB. Donned bilat Teds, L AFO, and shoes. Performed supine>sit transfer with supervision and HOB flat, no rails with max encouragement to attempt without rails to simulate home environment; cueing focused on sequencing, technique with effective within-session carryover, as exhibited by pt performing supine>sit ~20 minutes later in session with mod I. Performed w/c mobility x160' in controlled environment with bilat LE's, min encouragement. In gym, pt negotiated 5 stairs with 2 rails facing forward with step-to pattern; supervision to ascend, min guard to descend, and min cueing for sequencing with effective within-session carryover. Remainder of treatment focused on assessing static standing balance. Attempted static standing without UE support in front of mat table with min guard due to  posterior trunk lean. Performed static standing with eyes closed x3 seconds prior to significant posterior LOB requiring mod A to recover.   Pt continues to rely heavily on external somatosensory feedback (rolling walker, LE's against bed/mat table) and visual input for postural stability due to peripheral neuropathy and absent balance recovery reactions. After seated rest break, pt returned to room via gat x155' in controlled environment with rolling walker and supervision for linear gait, min guard during turning; mod cueing for safety awareness. Pt with multiple episodes of feeling "swimmy headed" with no observed nystagmus, changes in postural alignment/stability. Session ended in pt room, where pt was left seated in w/c with all needs within reach.  Treatment Session 2: Pt received seated in w/c in gym having recently completed OT session; agreeable to therapy. Pt wearing L AFO (WalkOn). Session focused on gait training in home environment. Pt performed gait x160' in controlled environment, x75' in home environment with rolling walker with close supervision to min guard. Pt again with 2 episodes of pt requesting seated rest break, stating "my head's just not right." Per pt request, modified w/c to increase width, pt comfort. Session ended in pt room, where pt was left seated in w/c with all needs within reach, RN present.  Therapy Documentation Precautions:  Precautions Precautions: None Precaution Comments: Make sure Teds are on prior to standing/gait; check skin after orthosis use Restrictions Weight Bearing Restrictions: No Vital Signs: Therapy Vitals Pulse Rate: 65 BP: 132/67 mmHg Patient Position (if appropriate): Lying Pain: Pain Assessment Pain Assessment: No/denies pain Locomotion : Ambulation Ambulation/Gait Assistance: 5: Supervision;4: Min guard Wheelchair Mobility Distance: 160  Trunk/Postural Assessment : Postural Control Righting Reactions: No hip, ankle,  or stepping  strategies with posterior LOB  Balance: Balance Balance Assessed: Yes Static Standing Balance Static Standing - Balance Support: No upper extremity supported Static Standing - Level of Assistance: 3: Mod assist;5: Stand by assistance Static Standing - Comment/# of Minutes: SBA with static standing with single UE support; mod A for static standing without UE support with eyes closed Static Stance: Eyes closed Static Stance: Eyes Closed: x3 seconds prior to significant posterior LOB requiring mod A to recover  See FIM for current functional status  Therapy/Group: Individual Therapy  Garner Dullea, Malva Cogan 07/31/2014, 12:32 PM

## 2014-07-31 NOTE — Patient Care Conference (Signed)
Inpatient RehabilitationTeam Conference and Plan of Care Update Date: 07/31/2014   Time: 10;30 AM    Patient Name: [name]      Medical Record Number: [id number]  Date of Birth: 1946/01/01 Sex: Male         Room/Bed: 4W05C/4W05C-01 Payor Info: Payor: MEDICARE / Plan: MEDICARE PART A AND B / Product Type: *No Product type* /    Admitting Diagnosis: Polyarticular gout  Admit Date/Time:  07/24/2014  5:30 PM Admission Comments: No comment available   Primary Diagnosis:  <principal problem not specified> Principal Problem: <principal problem not specified>  Patient Active Problem List   Diagnosis Date Noted  . Osteoarthritis of both hands 07/30/2014  . Thrush, oral 07/28/2014  . Debility 07/25/2014  . Diabetic polyneuropathy associated with type 2 diabetes mellitus 07/25/2014  . Gout flare 07/24/2014  . Acute gout 07/22/2014  . CHF exacerbation 07/21/2014  . Pre-operative cardiovascular examination 07/15/2014  . Family history of malignant neoplasm of gastrointestinal tract 06/12/2014  . Male breast cancer 06/12/2014  . Cancer of male breast, right 05/29/2014  . Acute on chronic renal failure 04/29/2014  . 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 Acute on chronic systolic and diastolic heart failure, NYHA class 4 04/20/2014  . Diabetes mellitus 01/28/2014  . Panniculitis 04/14/2013  . Acute on chronic diastolic heart failure 82/42/3536  . Seasonal allergies 12/20/2012  . L1 vertebral fracture 09/07/2012  . Acute renal failure 08/30/2012  . Hypothyroidism 08/30/2012  . ETOH abuse   . Atrial fibrillation   . Chronic diastolic heart failure   . DM (diabetes mellitus) type II uncontrolled, periph vascular disorder 06/20/2009  . Obstructive sleep apnea 06/20/2009  . CAROTID STENOSIS 06/20/2009  . CHRONIC OBSTRUCTIVE PULMONARY DISEASE 06/20/2009  . CAD 06/20/2009  . Hypderlipidemia   . Hypertensive heart disease     Expected  Discharge Date: Expected Discharge Date: 08/02/14  Team Members Present: Physician leading conference: Dr. Alysia Penna Social Worker Present: Ovidio Kin, LCSW Nurse Present: Heather Roberts, RN PT Present: Raylene Everts, PT;Caroline Lacinda Axon, PT;Blair Hobble, PT OT Present: Simonne Come, OT SLP Present: Windell Moulding, SLP PPS Coordinator present : Daiva Nakayama, RN, CRRN     Current Status/Progress Goal Weekly Team Focus  Medical   pt with dizziness acute and chronic  complete eval of gait disorder   MRI, Neuropsych eval   Bowel/Bladder   cont of bowel with lactulose BID; cont of bladder requires level 4 assist  cont of bowel with mod I assist and bladder with BSC  enc pt to use toilet when possible and do for himself; freq ask staff to hold urinal for him   Swallow/Nutrition/ Hydration     Regular diet        ADL's   supervision to min assist transfers, mod assist bathing, min assist UB dressing, total assist LB dressing  supervision overall, mod assist LB dressing  family education, functional mobility, use of AE and DME to increase independence with self-care tasks   Mobility   Supervision to Min A with transfers, gait, and stair negotiation  Supervision overall  Family education/training, obtain L AFO, stairs, dynamic standing balance, functional mobility in home environment   Communication     [facility]        Safety/Cognition/ Behavioral Observations    no unsafe behaviors        Pain   pain min controlled with oxy and neurontin. tylenol prn headaches; debrox for earaches  and gtts for eye issue. voltaren gel for arthritis in hands and gout improved  Pain maintained with scheduled medications and limited prn medications; pt want meds freq and notes oxy only good for leg cramps  monitor pain and use of prn and scheduled medications   Skin   periarea and scrotum reddened/? yeast to area; using moisture barrier cream and antifungal powder; difflucan administered as ordered  MASD areas and  yeast iimproving  keep skin dry; use cream and powder after toileting and BID      *See Care Plan and progress notes for long and short-term goals.  Barriers to Discharge: see above    Possible Resolutions to Barriers:  cont rehab, family ed    Discharge Planning/Teaching Needs:  Home with daughter to assist, some family education completed yesterday.  Her main concern is he doesn't go home before he is ready      Team Discussion:  Balance issues-dizziness yesterday checking with MRI today.  Gait disorder attributed to ETOH and neuropathy.  Wants more pain meds.  Neuro-psych to see regarding substance abuse issues.  Family education yesterday with daughter needs more  Revisions to Treatment Plan:  None   Continued Need for Acute Rehabilitation Level of Care: The patient requires daily medical management by a physician with specialized training in physical medicine and rehabilitation for the following conditions: Daily direction of a multidisciplinary physical rehabilitation program to ensure safe treatment while eliciting the highest outcome that is of practical value to the patient.: Yes Daily analysis of laboratory values and/or radiology reports with any subsequent need for medication adjustment of medical intervention for : Neurological problems;Other  [name], [name] 07/31/2014, 2:20 PM

## 2014-08-01 ENCOUNTER — Inpatient Hospital Stay (HOSPITAL_COMMUNITY): Payer: Medicare Other | Admitting: Physical Therapy

## 2014-08-01 ENCOUNTER — Inpatient Hospital Stay (HOSPITAL_COMMUNITY): Payer: Medicare Other | Admitting: Occupational Therapy

## 2014-08-01 ENCOUNTER — Inpatient Hospital Stay (HOSPITAL_COMMUNITY): Payer: Medicare Other

## 2014-08-01 ENCOUNTER — Encounter (HOSPITAL_COMMUNITY): Payer: Medicare Other

## 2014-08-01 DIAGNOSIS — I610 Nontraumatic intracerebral hemorrhage in hemisphere, subcortical: Secondary | ICD-10-CM

## 2014-08-01 DIAGNOSIS — I619 Nontraumatic intracerebral hemorrhage, unspecified: Secondary | ICD-10-CM | POA: Clinically undetermined

## 2014-08-01 DIAGNOSIS — I614 Nontraumatic intracerebral hemorrhage in cerebellum: Secondary | ICD-10-CM

## 2014-08-01 LAB — GLUCOSE, CAPILLARY
GLUCOSE-CAPILLARY: 127 mg/dL — AB (ref 70–99)
GLUCOSE-CAPILLARY: 183 mg/dL — AB (ref 70–99)
Glucose-Capillary: 141 mg/dL — ABNORMAL HIGH (ref 70–99)
Glucose-Capillary: 126 mg/dL — ABNORMAL HIGH (ref 70–99)

## 2014-08-01 MED ORDER — ASPIRIN EC 325 MG PO TBEC
325.0000 mg | DELAYED_RELEASE_TABLET | Freq: Every day | ORAL | Status: DC
  Administered 2014-08-01 – 2014-08-02 (×2): 325 mg via ORAL
  Filled 2014-08-01 (×3): qty 1

## 2014-08-01 NOTE — Progress Notes (Signed)
Discussed result for MRI with patient and daughter last pm. Patient concerned that he's not getting full treatment that he needs. Today discussed again concerns regarding ETOH use, fall risk, poor safety awareness, non-compliance concerns as well as driving restrictions till cleared by MD.

## 2014-08-01 NOTE — Progress Notes (Signed)
Physical Therapy Session Note  Patient Details  Name: [name] MRN: [id number] Date of Birth: August 20, 1946  Today's Date: 08/01/2014 PT Individual Time: [id number] and [id number] (PT present from [phone] but billable services provided during documented treatment time) PT Individual Time Calculation (min): 64 min and 35 min  Short Term Goals: Week 1:  PT Short Term Goal 1 (Week 1): Pt will perform supine>sit with supervision with HOB flat without rail. PT Short Term Goal 2 (Week 1): Pt will transfer from bed<>chair with min A with 25% cueing for sequencing and technique. PT Short Term Goal 3 (Week 1): Pt will perform static standing x1 minute with supervision and no LOB. PT Short Term Goal 4 (Week 1): Pt will ambulate x100' in controlled environment with LRAD and Mod A of single therapist. PT Short Term Goal 5 (Week 1): Pt will perform w/c mobility x100' consecutively without rest breaks with supervision.  Skilled Therapeutic Interventions/Progress Updates:    Treatment Session 1: Pt received seated in w/c; agreeable to therapy. Pt very inquisitive about cerebellar CVA, including possible causes and how to prevent future CVA. Therefore, provided education on CVA risk factors, role of cerebellum in balance/equilibrium, and compensatory strategies for disequilibrium with functional mobility/activites. Pt verbalized understanding but will likely require reinforcement. Orthotist, Gerald Stabs, present to deliver pt's personal L AFO Gershon Crane). Pt then performed gait x75' in controlled environment with rolling walker and  L AFO requiring supervision to min guard; gait trial ended per pt request secondary to onset of disequilibrium (subjective pt rating: 7/10) after turning head to L side to look at person passing by in hallway. Educated pt on utilization of visual targeting during transitional movement; reinforced education during turning (focus on eyes, then head, then body). Pt verbalized understanding and  gave effective return demonstration throughout remainder of session. Pt expressing urgent need to have BM; therefore, performed w/cmobility x150' in controlled environment with bilat LE's and supervision. Toilet transfer with supervision using grab bar. Pt attempted to perform perineal hygiene without success. Pt attributes unsuccessful attempts to inability to perform full squat position without LOB. Session ended in pt room, where pt was left seated in w/c with all needs within reach.  Treatment Session 2: Pt received seated in w/c; asleep but easily awakened. Noted transient L eye ptosis, dysconjugate gaze (L eye abducted) during initial few minutes after waking up. Pt also reporting blurred vision in L eye. Vitals (seated): BP 137/66, HR 71, SpO2 94%. Notified PA Pam of pt presentation, symptoms. PA Pam present to assess pt presentation; provided pt with education on importance of reporting vision changes to eye doctor due to diabetes. Pt verbalized understanding. Remainder of session focused on stair negotiation. Pt negotiated 5 stairs with bilat rails with step-to pattern and supervision, mod cueing for sequencing and for utilization of visual target as compensatory strategy for disequilibrium. Pt required no hands-on assist with stair negotiation for the first time during this session. Session ended in pt room, where pt was left seated in w/c with all needs within reach.   Therapy Documentation Precautions:  Precautions Precautions: None Precaution Comments: Make sure Teds are on prior to standing/gait; check skin after orthosis use Restrictions Weight Bearing Restrictions: No General:   Vital Signs: Therapy Vitals Temp Source: Oral Pulse Rate: 71 Resp: 20 BP: 120/48 mmHg Patient Position (if appropriate): Lying Oxygen Therapy SpO2: 97 % O2 Device: None (Room air) Pain: Pain Assessment Pain Assessment: No/denies pain Locomotion : Ambulation Ambulation/Gait Assistance: 5:  Supervision;4: Min guard  Wheelchair Mobility Distance: 150   See FIM for current functional status  Therapy/Group: Individual Therapy  Haron Beilke, Malva Cogan 08/01/2014, 10:48 AM

## 2014-08-01 NOTE — Progress Notes (Signed)
Occupational Therapy Discharge Summary  Patient Details  Name: [name] MRN: [id number] Date of Birth: 1946/07/26   Patient has met 5 of 7 long term goals due to improved activity tolerance, improved balance, ability to compensate for deficits, improved attention and improved awareness.  Patient to discharge at overall Supervision level, min assist LB dressing.  Patient's care partner is independent to provide the necessary physical and cognitive assistance at discharge.  Patient requires close supervision with all mobility with RW, daughter provided supervision and occasional assist with bathing and dressing PTA.  Reasons goals not met: Pt unable to complete bathing or toileting at supervision due to body habitus and fear of falling, as well as refusal of additional AE to assist with washing buttocks.  Daughter reports she assisted with wiping buttocks post BM and is okay with continuing to assist as needed.   Recommendation:  Patient will benefit from ongoing skilled OT services in home health setting to continue to advance functional skills in the area of BADL and Reduce care partner burden.  Equipment: No equipment provided  Reasons for discharge: treatment goals met and discharge from hospital  Patient/family agrees with progress made and goals achieved: Yes  OT Discharge Precautions/Restrictions  Precautions Precaution Comments: Make sure Teds are on prior to standing/gait; check skin after orthosis use General   Vital Signs Therapy Vitals Temp: 98 F (36.7 C) Temp Source: Oral Pulse Rate: 73 Resp: 18 BP: 143/65 mmHg Patient Position (if appropriate): Sitting Oxygen Therapy SpO2: 97 % O2 Device: None (Room air) Pain Pain Assessment Pain Assessment: No/denies pain ADL  See FIM Vision/Perception  Vision- History Baseline Vision/History: Wears glasses Wears Glasses: At all times Patient Visual Report: Diplopia;Nausea/blurring vision with head movement;Other  (comment);Blurring of vision (reports blurring in Lt eye) Vision- Assessment Vision Assessment?: Yes Eye Alignment: Within Functional Limits Ocular Range of Motion: Within Functional Limits Tracking/Visual Pursuits: Able to track stimulus in all quads without difficulty Visual Fields: No apparent deficits  Cognition Overall Cognitive Status: Within Functional Limits for tasks assessed Arousal/Alertness: Awake/alert Orientation Level: Oriented X4 Attention: Focused;Sustained Sustained Attention: Impaired Sustained Attention Impairment: Functional basic Memory: Appears intact Awareness: Impaired Problem Solving: Impaired Safety/Judgment: Appears intact Comments: anxious, fearful of falling Sensation Sensation Light Touch: Impaired Detail Light Touch Impaired Details: Impaired RLE;Impaired LLE;Impaired RUE;Impaired LUE Proprioception: Impaired Detail Proprioception Impaired Details: Impaired LLE;Impaired RLE;Impaired RUE;Impaired LUE Coordination Gross Motor Movements are Fluid and Coordinated: No Fine Motor Movements are Fluid and Coordinated: No Extremity/Trunk Assessment RUE Assessment RUE Assessment: Within Functional Limits LUE Assessment LUE Assessment: Within Functional Limits  See FIM for current functional status  Leetta Hendriks, [facility] 08/01/2014, 3:44 PM

## 2014-08-01 NOTE — Progress Notes (Signed)
Advanced Heart Failure Rounding Note   Subjective:   Yesterday he was complaining of dizziness. Had MRA with PICA thrombosis with concern of adjacent left vertebral dissection or focal plaque.Neurology consulted.    Denies SOB/Orthopnea.    Objective:   Weight Range:  Vital Signs:   Temp:  [97.7 F (36.5 C)] 97.7 F (36.5 C) (10/21 1411) Pulse Rate:  [65-71] 71 (10/22 0741) Resp:  [18-20] 20 (10/22 0642) BP: (114-136)/(48-64) 120/48 mmHg (10/22 0741) SpO2:  [92 %-97 %] 97 % (10/22 0935) Weight:  [240 lb 4.8 oz (109 kg)] 240 lb 4.8 oz (109 kg) (10/22 0641) Last BM Date: 07/31/14  Weight change: Filed Weights   07/30/14 0451 07/31/14 0700 08/01/14 0641  Weight: 217 lb 13 oz (98.8 kg) 242 lb 4.6 oz (109.9 kg) 240 lb 4.8 oz (109 kg)    Intake/Output:   Intake/Output Summary (Last 24 hours) at 08/01/14 1229 Last data filed at 08/01/14 0900  Gross per 24 hour  Intake    480 ml  Output      0 ml  Net    480 ml     Physical Exam:  General: Chronically ill appearing. No resp difficulty. Sitting in chair.  HEENT: normal  Neck: supple. JVP appears 6 cm Carotids 2+ bilaterally; no bruits. No lymphadenopathy or thryomegaly appreciated.  Cor: PMI normal. Irregular rate & irregular rhythm. No rubs, gallops. 1/6 SEM RUSB  Lungs: CTA  Abdomen: obese, soft, nontender, mildly distended. No hepatosplenomegaly. No bruits or masses. Good bowel sounds.  Extremities: no cyanosis, clubbing, rash, trace edema (L>R, chronic) Shoulder and elbow ROM much improved. Rand LLE ted hose Neuro: alert & orientedx3, cranial nerves grossly intact. Moves all 4 extremities w/o difficulty. Affect pleasant.      Labs: Basic Metabolic Panel:  Recent Labs Lab 07/26/14 0525 07/27/14 0505 07/28/14 0636 07/29/14 0545 07/31/14 0700  NA 136* 137 138 139 138  K 3.8 3.7 3.4* 3.6* 4.2  CL 93* 93* 94* 94* 94*  CO2 33* 33* 33* 33* 30  GLUCOSE 160* 156* 125* 117* 143*  BUN 49* 45* 46* 49* 54*   CREATININE 0.92 0.92 0.93 1.05 1.19  1.21  CALCIUM 9.4 9.2 9.4 9.3 9.4    Liver Function Tests: No results found for this basename: AST, ALT, ALKPHOS, BILITOT, PROT, ALBUMIN,  in the last 168 hours No results found for this basename: LIPASE, AMYLASE,  in the last 168 hours No results found for this basename: AMMONIA,  in the last 168 hours  CBC:  Recent Labs Lab 07/27/14 1135  WBC 6.7  NEUTROABS 4.4  HGB 12.2*  HCT 37.5*  MCV 94.9  PLT 137*    Cardiac Enzymes: No results found for this basename: CKTOTAL, CKMB, CKMBINDEX, TROPONINI,  in the last 168 hours  BNP: BNP (last 3 results)  Recent Labs  04/20/14 1655 06/11/14 1624 07/21/14 1300  PROBNP 17870.0* 4440.0* 3542.0*     Other results:    Imaging: Ct Head Wo Contrast  08/01/2014   CLINICAL DATA:  Dizziness and weakness, stroke.  EXAM: CT HEAD WITHOUT CONTRAST  TECHNIQUE: Contiguous axial images were obtained from the base of the skull through the vertex without intravenous contrast.  COMPARISON:  MRI of the head July 31, 2014  FINDINGS: LEFT inferior cerebellar hypodensity with superimposed punctate densities medially corresponding to susceptibility artifact. The ventricles and sulci are overall normal for patient's age. No midline shift or mass effect. Mild white matter changes suggest chronic small vessel ischemic disease;  LEFT frontal mesial transcortical encephalomalacia is likely ischemic in etiology. RIGHT parietal convexity extra-axial presumed small subarachnoid cyst. Basal cisterns are patent. Severe calcific atherosclerosis of the carotid siphons.  No paranasal sinus air-fluid levels. Tiny RIGHT maxillary mucous retention cyst. Mastoid air cells are well-aerated. No skull fracture. Ocular globes and orbital contents are unremarkable.  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  Involutional changes. Mild white matter changes suggest chronic  small vessel ischemic disease. LEFT mesial frontal lobe encephalomalacia is likely post ischemic.   Electronically Signed   By: Elon Alas   On: 08/01/2014 06:00   [name] Head Wo Contrast  07/31/2014   CLINICAL DATA:  Dizziness, recent frequent falls, generalized weakness. Alcohol abuse. Metastatic breast cancer. Diabetes mellitus.  EXAM: MRI HEAD WITHOUT CONTRAST  MRA HEAD WITHOUT CONTRAST  TECHNIQUE: Multiplanar, multiecho pulse sequences of the brain and surrounding structures were obtained without intravenous contrast. Angiographic images of the head were obtained using MRA technique without contrast.  COMPARISON:  CT head 07/21/2014.  FINDINGS: MRI HEAD FINDINGS  There is a moderate-sized area of acute infarction affecting the LEFT inferior cerebellum. Laterally there is largely confluent but scattered focal areas of restricted diffusion lying within the LEFT posterior inferior cerebellar artery territory. Medially, also involving the tonsil, there is susceptibility artifact as seen on gradient, T2, and diffusion sequence consistent with hemorrhagic transformation. This should be monitored carefully with CT. There is no shift of the fourth ventricle or significant deformity to imply impending hydrocephalus. No tonsillar herniation.  Generalized cerebral and cerebellar atrophy is advanced for the patient's age. There is extensive T2 and FLAIR hyperintensity representing chronic microvascular ischemic change. There is an old LEFT frontal infarct. Tiny foci of chronic hemorrhage can be seen elsewhere in the brain, likely sequelae of hypertension.  Flow voids are maintained in the carotid, basilar, and LEFT vertebral arteries. The RIGHT vertebral appears to end in PICA. The LEFT vertebral appears moderately diseased, described below.  No midline abnormality. No sinus disease. Negative orbits. No mastoid fluid. Negative osseous structures.  MRA HEAD FINDINGS  The internal carotid arteries demonstrate mild  irregularity in their cavernous segments. BILATERAL ICA termini are widely patent.  Basilar artery is widely patent with LEFT vertebral a sole contributor.  There is diminished flow related enhancement in the LEFT vertebral particularly in its distal V4 segment. There is considerable motion degradation, but it appears likely that there is focal plaque or localized dissection within the LEFT vertebral. This is also suggested on the gradient sequence (images 24-28 series 9). Other than a short stump representing its origin (image 32 series 8), there is no appreciable flow related enhancement in the LEFT PICA.  No proximal stenosis of the middle cerebral arteries. Atretic or severely diseased A1 ACA on the RIGHT. Azygos LEFT ACA supplies both anterior cerebrals. 75% stenosis of the distal P1 segment RIGHT PCA. There is prominence at the origin of the LEFT posterior communicating artery. I suspect this represents an infundibulum.  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 Recommend continued surveillance with CT scanning to exclude expanding posterior fossa hematoma.  LEFT PICA thrombosis. Suspect adjacent LEFT vertebral dissection or focal plaque.  Generalized atrophy with small vessel disease.  Findings discussed with ordering provider.   Electronically Signed   By: Rolla Flatten M.D.   On: 07/31/2014 14:56   Mr Brain Wo Contrast  07/31/2014   CLINICAL DATA:  Dizziness, recent frequent  falls, generalized weakness. Alcohol abuse. Metastatic breast cancer. Diabetes mellitus.  EXAM: MRI HEAD WITHOUT CONTRAST  MRA HEAD WITHOUT CONTRAST  TECHNIQUE: Multiplanar, multiecho pulse sequences of the brain and surrounding structures were obtained without intravenous contrast. Angiographic images of the head were obtained using MRA technique without contrast.  COMPARISON:  CT head 07/21/2014.  FINDINGS: MRI HEAD FINDINGS  There is a  moderate-sized area of acute infarction affecting the LEFT inferior cerebellum. Laterally there is largely confluent but scattered focal areas of restricted diffusion lying within the LEFT posterior inferior cerebellar artery territory. Medially, also involving the tonsil, there is susceptibility artifact as seen on gradient, T2, and diffusion sequence consistent with hemorrhagic transformation. This should be monitored carefully with CT. There is no shift of the fourth ventricle or significant deformity to imply impending hydrocephalus. No tonsillar herniation.  Generalized cerebral and cerebellar atrophy is advanced for the patient's age. There is extensive T2 and FLAIR hyperintensity representing chronic microvascular ischemic change. There is an old LEFT frontal infarct. Tiny foci of chronic hemorrhage can be seen elsewhere in the brain, likely sequelae of hypertension.  Flow voids are maintained in the carotid, basilar, and LEFT vertebral arteries. The RIGHT vertebral appears to end in PICA. The LEFT vertebral appears moderately diseased, described below.  No midline abnormality. No sinus disease. Negative orbits. No mastoid fluid. Negative osseous structures.  MRA HEAD FINDINGS  The internal carotid arteries demonstrate mild irregularity in their cavernous segments. BILATERAL ICA termini are widely patent.  Basilar artery is widely patent with LEFT vertebral a sole contributor.  There is diminished flow related enhancement in the LEFT vertebral particularly in its distal V4 segment. There is considerable motion degradation, but it appears likely that there is focal plaque or localized dissection within the LEFT vertebral. This is also suggested on the gradient sequence (images 24-28 series 9). Other than a short stump representing its origin (image 32 series 8), there is no appreciable flow related enhancement in the LEFT PICA.  No proximal stenosis of the middle cerebral arteries. Atretic or severely diseased  A1 ACA on the RIGHT. Azygos LEFT ACA supplies both anterior cerebrals. 75% stenosis of the distal P1 segment RIGHT PCA. There is prominence at the origin of the LEFT posterior communicating artery. I suspect this represents an infundibulum.  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 Recommend continued surveillance with CT scanning to exclude expanding posterior fossa hematoma.  LEFT PICA thrombosis. Suspect adjacent LEFT vertebral dissection or focal plaque.  Generalized atrophy with small vessel disease.  Findings discussed with ordering provider.   Electronically Signed   By: Rolla Flatten M.D.   On: 07/31/2014 14:56     Medications:     Scheduled Medications: . allopurinol  300 mg Oral Daily  . aspirin EC  325 mg Oral Daily  . atorvastatin  20 mg Oral Daily  . carbamide peroxide  5 drop Both Ears BID  . carvedilol  3.125 mg Oral BID WC  . colchicine  0.6 mg Oral BID  . diclofenac sodium  2 g Topical QID  . fluconazole  100 mg Oral Daily  . FLUoxetine  20 mg Oral Daily  . fluticasone  1 puff Inhalation BID  . folic acid  1 mg Oral Daily  . furosemide  160 mg Oral 2 times per day  . gabapentin  300 mg Oral BID  . insulin aspart  0-9 Units Subcutaneous TID WC  . insulin aspart  3  Units Subcutaneous TID WC  . insulin glargine  8 Units Subcutaneous QHS  . lactulose  20 g Oral BID  . levothyroxine  187 mcg Oral QAC breakfast  . multivitamin with minerals  1 tablet Oral Daily  . pantoprazole  40 mg Oral Daily  . potassium chloride  20 mEq Oral Daily    Infusions:    PRN Medications: acetaminophen, alum & mag hydroxide-simeth, bisacodyl, guaiFENesin-dextromethorphan, naphazoline, oxyCODONE, prochlorperazine, prochlorperazine, prochlorperazine   Assessment:   1. Acute polyarticular gout  2. Chronic diastolic HF  3. ETOH abuse  4. CKD, Stage III (baseline ~1.4)  5. Chronic AF  6. Breast CA  pending mastectomy 7. Debilitation/imbalance 8. CVA, acute hemorrhagic cerebellar   Plan/Discussion:   Neurology following for new CVA.   Volume status stable. Weight down 2 pounds. Continue current diureic regimen.   Rate controlled. Continue current dose of carvedilol.    Length of Stay: 8  CLEGG,AMY Np-C  08/01/2014, 12:29 PM  Advanced Heart Failure Team Pager [phone] (M-F; [place])  Please contact [facility] for night-coverage after hours (4p -7a ) and weekends on [email or web address]  Patient seen and examined with Darrick Grinder, NP. We discussed all aspects of the encounter. I agree with the assessment and plan as stated above.   Results of MRI reviewed. Appreciate CIR and Neurology Care.   Volume status stable. Continue current regimen. Will follow Neurology recs with regard to ASA therapy.   Rodel Glaspy,MD 12:40 PM

## 2014-08-01 NOTE — Progress Notes (Signed)
STROKE TEAM PROGRESS NOTE   HISTORY [name] is an 68 y.o. male with HTN, CAF, ongoing alcohol abuse, chronic pain, who was admitted on 07/21/14 with fatigue, increased weakness and BLE edema. He was treated with IV lasix due to complaints of SOB and Dr. Haroldine [name] consulted for input. CT head with moderate generalized atrophy with progression since 2011. He felt that volume status was stable but patient with polyarticular gout flare in setting of ETOH abuse and added steroids for treatment. While in hospital HE has been complaining of feeling off balance and while on rehab noted he felt as though he was drifting to the left while he walked. He notes he feels light headed if he looks sown while walking. He denies any diplopia, blurred vision, dysarthria. Patient has known Afib but has not been placed on AC due to fall risk and on going ETOH abuse. Neurology was asked to evaluate. Unable to determine last known well, therefore, not a candidate for tPA.   SUBJECTIVE (INTERVAL HISTORY) His therapists are at the bedside.  Overall he feels his condition is stable.    OBJECTIVE Temp:  [97.7 F (36.5 C)] 97.7 F (36.5 C) (10/21 1411) Pulse Rate:  [65-71] 71 (10/22 0741) Cardiac Rhythm:  [-]  Resp:  [18-20] 20 (10/22 0642) BP: (114-136)/(48-67) 120/48 mmHg (10/22 0741) SpO2:  [92 %-96 %] 96 % (10/22 0642) Weight:  [109 kg (240 lb 4.8 oz)] 109 kg (240 lb 4.8 oz) (10/22 0641)   Recent Labs Lab 07/31/14 0738 07/31/14 1209 07/31/14 1653 07/31/14 2113 08/01/14 0703  GLUCAP 155* 131* 134* 185* 141*    Recent Labs Lab 07/26/14 0525 07/27/14 0505 07/28/14 0636 07/29/14 0545 07/31/14 0700  NA 136* 137 138 139 138  K 3.8 3.7 3.4* 3.6* 4.2  CL 93* 93* 94* 94* 94*  CO2 33* 33* 33* 33* 30  GLUCOSE 160* 156* 125* 117* 143*  BUN 49* 45* 46* 49* 54*  CREATININE 0.92 0.92 0.93 1.05 1.19  1.21  CALCIUM 9.4 9.2 9.4 9.3 9.4    Recent Labs Lab 07/27/14 1135  WBC 6.7  NEUTROABS 4.4   HGB 12.2*  HCT 37.5*  MCV 94.9  PLT 137*       Component Value Date/Time   CHOL  Value: 144        ATP III CLASSIFICATION:  <200     mg/dL   Desirable  200-239  mg/dL   Borderline High  >=240    mg/dL   High        06/26/2010 0420   TRIG 150* 06/26/2010 0420   HDL 24* 06/26/2010 0420   CHOLHDL 6.0 06/26/2010 0420   VLDL 30 06/26/2010 0420   LDLCALC  Value: 90        Total Cholesterol/HDL:CHD Risk Coronary Heart Disease Risk Table                     Men   Women  1/2 Average Risk   3.4   3.3  Average Risk       5.0   4.4  2 X Average Risk   9.6   7.1  3 X Average Risk  23.4   11.0        Use the calculated Patient Ratio above and the CHD Risk Table to determine the patient's CHD Risk.        ATP III CLASSIFICATION (LDL):  <100     mg/dL   Optimal  100-129  mg/dL  Near or Above                    Optimal  130-159  mg/dL   Borderline  160-189  mg/dL   High  >190     mg/dL   Very High 06/26/2010 0420   Lab Results  Component Value Date   HGBA1C 6.8* 07/21/2014      Component Value Date/Time   LABOPIA POSITIVE* 07/21/2014 2152   COCAINSCRNUR NONE DETECTED 07/21/2014 2152   LABBENZ NONE DETECTED 07/21/2014 2152   AMPHETMU NONE DETECTED 07/21/2014 2152   THCU NONE DETECTED 07/21/2014 2152   LABBARB NONE DETECTED 07/21/2014 2152     Ct Head Wo Contrast 08/01/2014    Evolving LEFT posterior inferior cerebellar artery territory infarct with petechial hemorrhage.  Involutional changes. Mild white matter changes suggest chronic small vessel ischemic disease. LEFT mesial frontal lobe encephalomalacia is likely post ischemic.    07/21/2014 1. No acute intracranial abnormality. 2. Moderate generalized atrophy, progressive since 2011. Stable mild chronic microvascular ischemic changes of the white matter.   Mr Brain Wo Contrast 07/31/2014    Moderate-sized acute infarction affecting the LEFT inferior cerebellum, with hemorrhagic transformation medially. Generalized atrophy with small vessel disease.     Mr [name] Head Wo Contrast 07/31/2014      LEFT PICA thrombosis. Suspect adjacent LEFT vertebral dissection or focal plaque.   2D Echocardiogram  04/21/2014 EF 50-55% with no source of embolus.    PHYSICAL EXAM Obese elderly Caucasian male not in distress.Awake alert. Afebrile. Head is nontraumatic. Neck is supple without bruit. Hearing is normal. Cardiac exam no murmur or gallop. Lungs are clear to auscultation. Distal pulses are well felt. Neurological Exam : Awake alert oriented x3. Normal speech and language. No aphasia, dysarthria or apraxia. Fundi were not visualized. Vision acuity and fields appear normal. Extraocular movements are full range without nystagmus. Face is symmetric. Tongue is midline. Motor system exam reveals no upper extremity drift. Symmetric and equal strength in all 4 limbs. Mild left finger-to-nose dysmetria. No need to his dysmetria. The deep tendon reflexes are depressed. Sensation is intact. Gait was not tested. ASSESSMENT/PLAN [name] is a 68 y.o. male with history of HTN, CAF, ongoing alcohol abuse, chronic pain who was diagnosed with moderate L inferior cerebellar infarct with hemorrhagic transformation after noting "leaning to the left" while on rehab following admission for dyspnea. He did not receive IV t-PA due to unknown time of onset.   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 given hx ongoing etoh use and falls, do not recommend anticoagulation. Have added aspirin 325 mg daily (hemorrhagic transformation is not significant enough to preclude).  Ongoing aggressive risk factor management  Continued rehab care  Atrial Fibrillation  CHA2DS2-VASc Score ?2, oral anticoagulation recommended  iven hx ongoing etoh use and  falls, do not recommend anticoagulation. Have added aspirin 325 mg daily (hemorrhagic transformation is not significant enough to preclude).  Hypertension  Stable  Hyperlipidemia  Home meds:  lipitor 20, resumed   LDL ordered, goal < 70  Continue statin at discharge  Diabetes  HgbA1c 6.8 goal < 7.0  Controlled  Other Stroke Risk Factors Advanced age Former Cigarette smoker, stopped 1970 ETOH use   Obesity,  Body mass index is 37.06 kg/(m^2).    Family hx stroke (mother, father)   Coronary artery disease - s/p CABG Obstructive sleep apnea, on CPAP at home Carotid stenosis s/p L CEA 2002, stable patch in 2012  Other Active Problems  Thrombocytopenia. PLT 136  Chronic back pain  Hospital day # 8  [facility] BIBY, MSN, RN, ANVP-BC, ANP-BC, [facility] Pager: [phone] 08/01/2014 3:39 PM  I have personally examined this patient, reviewed notes, independently viewed imaging studies, participated in medical decision making and plan of care. I have made any additions or clarifications directly to the above note. Agree with note above.   Antony Contras, MD Medical Director [facility] Pager: [phone] 08/01/2014 6:43 PM

## 2014-08-01 NOTE — Progress Notes (Signed)
Occupational Therapy Session Note  Patient Details  Name: [name] MRN: [id number] Date of Birth: 04-04-1946  Today's Date: 08/01/2014 OT Individual Time: [phone] and 1350-1420 OT Individual Time Calculation (min): 55 min and 30 min   Short Term Goals: Week 1:  OT Short Term Goal 1 (Week 1): Pt will engage in 10 minutes of functional task without requiring rest break in order to increase endurance for functional tasks.  OT Short Term Goal 2 (Week 1): Pt will perform LB dressing with Mod A in order to increase I in self care. OT Short Term Goal 3 (Week 1): Pt will perform toileting with Mod A in order to increase I in self care. OT Short Term Goal 4 (Week 1): Pt will perform shower transfer with Min A in order to increase I in functional transfer.  OT Short Term Goal 5 (Week 1): Pt will perform toilet transfer with Min A in order to increase I in functional transfers.  Skilled Therapeutic Interventions/Progress Updates:    1) Engaged in ADL retraining with focus on increased independence with self-care tasks of bathing and dressing.  Pt received seated EOB finishing breakfast discussing MRI results with MD.  Engaged in bathing at sit > stand level in room shower with focus on use of AE and DME to increase independence with bathing.  Educated pt on use of shower chair for energy conservation and increased safety due to decreased balance reactions and fall risk, pt reports he has a shower chair but doesn't like to use it.  Pt continues to require physical assist to wash buttocks secondary to body habitus and decreased sensation in BUE, however will long handled sponge completed bathing BLE including feet.  Stand pivot transfer to/from room shower supervision from w/c, however pt's home bathroom is not accessible via w/c.  Dressing completed with setup assist and increased time, min verbal cues for orientation of shirt and problem solving with threading RLE.  Pt required assist to don Lt shoe  due to AFO, however fastened shoe this session.  2) Engaged in education on functional transfers in ADL apartment with RW.  Utilized simulated walk-in shower with 3" ledge to simulate pt's home walk-in shower.  Educated on side stepping into shower to best utilize grab bars in his home shower and sequencing of stepping over ledge with grasping grab bars.  Pt return demonstrated with use of RW and simulated grab bars with supervision.  Ambulated into ADL bathroom to perform toilet transfer with RW, pt supervision with this transfer, reports having built up toilet seat.  Educated pt on use of toileting aid to assist with hygiene when toileting, demonstrating how toilet aid would work.  Pt reports not interested in toileting aid and "it will work out" when he returns home.  Question whether pt would be able to utilize toileting aid with arthritis in hands and peripheral neuropathy.  Therapy Documentation Precautions:  Precautions Precautions: None Precaution Comments: Make sure Teds are on prior to standing/gait; check skin after orthosis use Restrictions Weight Bearing Restrictions: No General:   Vital Signs: Therapy Vitals Pulse Rate: 71 BP: 120/48 mmHg Oxygen Therapy SpO2: 97 % O2 Device: None (Room air) Pain: Pain Assessment Pain Assessment: No/denies pain  See FIM for current functional status  Therapy/Group: Individual Therapy  Simonne Come 08/01/2014, 10:56 AM

## 2014-08-01 NOTE — Progress Notes (Signed)
Patient ID: [name], male   DOB: 15-Nov-1945, 68 y.o.   MRN: [id number]   Subjective/Complaints: 68 y.o. male with history of OSA, HTN, CAF, ongoing alcohol abuse, chronic pain; who was admitted on 07/21/14 with fatigue, increased weakness and BLE edema. He was treated with IV lasix due to complaints of SOB and Dr. Haroldine Laws consulted for input. CT head with moderate generalized atrophy with progression since 2011. He felt that volume status was stable but patient with polyarticular gout flare in setting of ETOH abuse and added steroids for treatment. Uric acid 11.1 with TSH 5.74. X rays lumbar spine with extensive spondylosis and osteoarthritic change with diffuse idiopathic skeletal hyperostosis. Therapy evaluation done and patient with significant balance deficits with ataxia as well as difficulty with mobility due to gout flare  Discussed MRI Denies facial or arm numbess no sitting balance problems No dysphagia  Review of systems is negative except as above. He does have bilateral foot drop noted worse on the left side Objective: Vital Signs: Blood pressure 120/48, pulse 71, temperature 97.7 F (36.5 C), temperature source Oral, resp. rate 20, height 5' 7.5" (1.715 m), weight 109 kg (240 lb 4.8 oz), SpO2 96.00%. Ct Head Wo Contrast  08/01/2014   CLINICAL DATA:  Dizziness and weakness, stroke.  EXAM: CT HEAD WITHOUT CONTRAST  TECHNIQUE: Contiguous axial images were obtained from the base of the skull through the vertex without intravenous contrast.  COMPARISON:  MRI of the head July 31, 2014  FINDINGS: LEFT inferior cerebellar hypodensity with superimposed punctate densities medially corresponding to susceptibility artifact. The ventricles and sulci are overall normal for patient's age. No midline shift or mass effect. Mild white matter changes suggest chronic small vessel ischemic disease; LEFT frontal mesial transcortical encephalomalacia is likely ischemic in etiology. RIGHT parietal  convexity extra-axial presumed small subarachnoid cyst. Basal cisterns are patent. Severe calcific atherosclerosis of the carotid siphons.  No paranasal sinus air-fluid levels. Tiny RIGHT maxillary mucous retention cyst. Mastoid air cells are well-aerated. No skull fracture. Ocular globes and orbital contents are unremarkable.  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  Involutional changes. Mild white matter changes suggest chronic small vessel ischemic disease. LEFT mesial frontal lobe encephalomalacia is likely post ischemic.   Electronically Signed   By: Elon Alas   On: 08/01/2014 06:00   [name] Head Wo Contrast  07/31/2014   CLINICAL DATA:  Dizziness, recent frequent falls, generalized weakness. Alcohol abuse. Metastatic breast cancer. Diabetes mellitus.  EXAM: MRI HEAD WITHOUT CONTRAST  MRA HEAD WITHOUT CONTRAST  TECHNIQUE: Multiplanar, multiecho pulse sequences of the brain and surrounding structures were obtained without intravenous contrast. Angiographic images of the head were obtained using MRA technique without contrast.  COMPARISON:  CT head 07/21/2014.  FINDINGS: MRI HEAD FINDINGS  There is a moderate-sized area of acute infarction affecting the LEFT inferior cerebellum. Laterally there is largely confluent but scattered focal areas of restricted diffusion lying within the LEFT posterior inferior cerebellar artery territory. Medially, also involving the tonsil, there is susceptibility artifact as seen on gradient, T2, and diffusion sequence consistent with hemorrhagic transformation. This should be monitored carefully with CT. There is no shift of the fourth ventricle or significant deformity to imply impending hydrocephalus. No tonsillar herniation.  Generalized cerebral and cerebellar atrophy is advanced for the patient's age. There is extensive T2 and FLAIR hyperintensity representing chronic microvascular ischemic change.  There is an old LEFT frontal infarct. Tiny foci of chronic hemorrhage can  be seen elsewhere in the brain, likely sequelae of hypertension.  Flow voids are maintained in the carotid, basilar, and LEFT vertebral arteries. The RIGHT vertebral appears to end in PICA. The LEFT vertebral appears moderately diseased, described below.  No midline abnormality. No sinus disease. Negative orbits. No mastoid fluid. Negative osseous structures.  MRA HEAD FINDINGS  The internal carotid arteries demonstrate mild irregularity in their cavernous segments. BILATERAL ICA termini are widely patent.  Basilar artery is widely patent with LEFT vertebral a sole contributor.  There is diminished flow related enhancement in the LEFT vertebral particularly in its distal V4 segment. There is considerable motion degradation, but it appears likely that there is focal plaque or localized dissection within the LEFT vertebral. This is also suggested on the gradient sequence (images 24-28 series 9). Other than a short stump representing its origin (image 32 series 8), there is no appreciable flow related enhancement in the LEFT PICA.  No proximal stenosis of the middle cerebral arteries. Atretic or severely diseased A1 ACA on the RIGHT. Azygos LEFT ACA supplies both anterior cerebrals. 75% stenosis of the distal P1 segment RIGHT PCA. There is prominence at the origin of the LEFT posterior communicating artery. I suspect this represents an infundibulum.  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 Recommend continued surveillance with CT scanning to exclude expanding posterior fossa hematoma.  LEFT PICA thrombosis. Suspect adjacent LEFT vertebral dissection or focal plaque.  Generalized atrophy with small vessel disease.  Findings discussed with ordering provider.   Electronically Signed   By: Rolla Flatten M.D.   On: 07/31/2014 14:56   Mr Brain Wo  Contrast  07/31/2014   CLINICAL DATA:  Dizziness, recent frequent falls, generalized weakness. Alcohol abuse. Metastatic breast cancer. Diabetes mellitus.  EXAM: MRI HEAD WITHOUT CONTRAST  MRA HEAD WITHOUT CONTRAST  TECHNIQUE: Multiplanar, multiecho pulse sequences of the brain and surrounding structures were obtained without intravenous contrast. Angiographic images of the head were obtained using MRA technique without contrast.  COMPARISON:  CT head 07/21/2014.  FINDINGS: MRI HEAD FINDINGS  There is a moderate-sized area of acute infarction affecting the LEFT inferior cerebellum. Laterally there is largely confluent but scattered focal areas of restricted diffusion lying within the LEFT posterior inferior cerebellar artery territory. Medially, also involving the tonsil, there is susceptibility artifact as seen on gradient, T2, and diffusion sequence consistent with hemorrhagic transformation. This should be monitored carefully with CT. There is no shift of the fourth ventricle or significant deformity to imply impending hydrocephalus. No tonsillar herniation.  Generalized cerebral and cerebellar atrophy is advanced for the patient's age. There is extensive T2 and FLAIR hyperintensity representing chronic microvascular ischemic change. There is an old LEFT frontal infarct. Tiny foci of chronic hemorrhage can be seen elsewhere in the brain, likely sequelae of hypertension.  Flow voids are maintained in the carotid, basilar, and LEFT vertebral arteries. The RIGHT vertebral appears to end in PICA. The LEFT vertebral appears moderately diseased, described below.  No midline abnormality. No sinus disease. Negative orbits. No mastoid fluid. Negative osseous structures.  MRA HEAD FINDINGS  The internal carotid arteries demonstrate mild irregularity in their cavernous segments. BILATERAL ICA termini are widely patent.  Basilar artery is widely patent with LEFT vertebral a sole contributor.  There is diminished flow  related enhancement in the LEFT vertebral particularly in its distal V4 segment. There is considerable motion degradation, but it appears likely that there is focal plaque or localized dissection within  the LEFT vertebral. This is also suggested on the gradient sequence (images 24-28 series 9). Other than a short stump representing its origin (image 32 series 8), there is no appreciable flow related enhancement in the LEFT PICA.  No proximal stenosis of the middle cerebral arteries. Atretic or severely diseased A1 ACA on the RIGHT. Azygos LEFT ACA supplies both anterior cerebrals. 75% stenosis of the distal P1 segment RIGHT PCA. There is prominence at the origin of the LEFT posterior communicating artery. I suspect this represents an infundibulum.  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 Recommend continued surveillance with CT scanning to exclude expanding posterior fossa hematoma.  LEFT PICA thrombosis. Suspect adjacent LEFT vertebral dissection or focal plaque.  Generalized atrophy with small vessel disease.  Findings discussed with ordering provider.   Electronically Signed   By: Rolla Flatten M.D.   On: 07/31/2014 14:56   Results for orders placed during the hospital encounter of 07/24/14 (from the past 72 hour(s))  GLUCOSE, CAPILLARY     Status: None   Collection Time    07/29/14 12:06 PM      Result Value Ref Range   Glucose-Capillary 99  70 - 99 mg/dL  GLUCOSE, CAPILLARY     Status: Abnormal   Collection Time    07/29/14  5:03 PM      Result Value Ref Range   Glucose-Capillary 130 (*) 70 - 99 mg/dL  GLUCOSE, CAPILLARY     Status: Abnormal   Collection Time    07/29/14  9:07 PM      Result Value Ref Range   Glucose-Capillary 116 (*) 70 - 99 mg/dL  GLUCOSE, CAPILLARY     Status: Abnormal   Collection Time    07/30/14  7:00 AM      Result Value Ref Range   Glucose-Capillary 155 (*) 70 - 99 mg/dL  GLUCOSE,  CAPILLARY     Status: Abnormal   Collection Time    07/30/14 12:12 PM      Result Value Ref Range   Glucose-Capillary 167 (*) 70 - 99 mg/dL  GLUCOSE, CAPILLARY     Status: Abnormal   Collection Time    07/30/14  4:35 PM      Result Value Ref Range   Glucose-Capillary 104 (*) 70 - 99 mg/dL  GLUCOSE, CAPILLARY     Status: Abnormal   Collection Time    07/30/14  9:30 PM      Result Value Ref Range   Glucose-Capillary 141 (*) 70 - 99 mg/dL   Comment 1 Notify RN    CREATININE, SERUM     Status: Abnormal   Collection Time    07/31/14  7:00 AM      Result Value Ref Range   Creatinine, Ser 1.19  0.50 - 1.35 mg/dL   GFR calc non Af Amer 60 (*) >90 mL/min   GFR calc Af Amer 70 (*) >90 mL/min   Comment: (NOTE)     The eGFR has been calculated using the CKD EPI equation.     This calculation has not been validated in all clinical situations.     eGFR's persistently <90 mL/min signify possible Chronic Kidney     Disease.  BASIC METABOLIC PANEL     Status: Abnormal   Collection Time    07/31/14  7:00 AM      Result Value Ref Range   Sodium 138  137 - 147 mEq/L   Potassium 4.2  3.7 - 5.3 mEq/L   Chloride 94 (*) 96 - 112 mEq/L   CO2 30  19 - 32 mEq/L   Glucose, Bld 143 (*) 70 - 99 mg/dL   BUN 54 (*) 6 - 23 mg/dL   Creatinine, Ser 1.21  0.50 - 1.35 mg/dL   Calcium 9.4  8.4 - 10.5 mg/dL   GFR calc non Af Amer 59 (*) >90 mL/min   GFR calc Af Amer 68 (*) >90 mL/min   Comment: (NOTE)     The eGFR has been calculated using the CKD EPI equation.     This calculation has not been validated in all clinical situations.     eGFR's persistently <90 mL/min signify possible Chronic Kidney     Disease.   Anion gap 14  5 - 15  GLUCOSE, CAPILLARY     Status: Abnormal   Collection Time    07/31/14  7:38 AM      Result Value Ref Range   Glucose-Capillary 155 (*) 70 - 99 mg/dL  GLUCOSE, CAPILLARY     Status: Abnormal   Collection Time    07/31/14 12:09 PM      Result Value Ref Range    Glucose-Capillary 131 (*) 70 - 99 mg/dL  GLUCOSE, CAPILLARY     Status: Abnormal   Collection Time    07/31/14  4:53 PM      Result Value Ref Range   Glucose-Capillary 134 (*) 70 - 99 mg/dL  GLUCOSE, CAPILLARY     Status: Abnormal   Collection Time    07/31/14  9:13 PM      Result Value Ref Range   Glucose-Capillary 185 (*) 70 - 99 mg/dL   Comment 1 Notify RN    GLUCOSE, CAPILLARY     Status: Abnormal   Collection Time    08/01/14  7:03 AM      Result Value Ref Range   Glucose-Capillary 141 (*) 70 - 99 mg/dL     HEENT:  , left eye with some dried drainage at the medial canthus, throat without plaques + erythema Cardio: RRR and No murmur Resp: CTA B/L and Unlabored GI: BS positive and Nontender nondistended Extremity:  No Edema, multiple arthritic deformities both hands Heberden's nodules  Neuro: Lethargic but awakens to voice. Musc/Skel:  Other No pain with upper extremity or lower extremity range of motion, no evidence of joint swelling Gen. no acute distress No limb ataxia Normal LT sensation face and hands sittin balance is good   Assessment/Plan: 1. Functional deficits secondary to Gout flare and severe diabetic neuropathy which require 3+ hours per day of interdisciplinary therapy in a comprehensive inpatient rehab setting. Physiatrist is providing close team supervision and 24 hour management of active medical problems listed below. Physiatrist and rehab team continue to assess barriers to discharge/monitor patient progress toward functional and medical goals. FIM: FIM - Bathing Bathing Steps Patient Completed: Chest;Right Arm;Left Arm;Abdomen;Right upper leg;Left upper leg;Front perineal area Bathing: 3: Mod-Patient completes 5-7 29f10 parts or 50-74%  FIM - Upper Body Dressing/Undressing Upper body dressing/undressing steps patient completed: Thread/unthread right sleeve of pullover shirt/dresss;Thread/unthread left sleeve of pullover shirt/dress;Put head through  opening of pull over shirt/dress Upper body dressing/undressing: 4: Min-Patient completed 75 plus % of tasks FIM - Lower Body Dressing/Undressing Lower body dressing/undressing steps patient completed: Pull pants up/down Lower body dressing/undressing: 1: Total-Patient completed less than 25% of tasks  FIM - Toileting Toileting steps completed by patient: Adjust clothing prior to toileting;Adjust clothing  after toileting Toileting Assistive Devices: Grab bar or rail for support Toileting: 3: Mod-Patient completed 2 of 3 steps  FIM - Radio producer Devices: Grab bars Toilet Transfers: 5-To toilet/BSC: Supervision (verbal cues/safety issues);4-From toilet/BSC: Min A (steadying Pt. > 75%)  FIM - Bed/Chair Transfer Bed/Chair Transfer Assistive Devices: Arm rests;Walker;Orthosis Bed/Chair Transfer: 5: Supine > Sit: Supervision (verbal cues/safety issues);6: Sit > Supine: No assist;5: Bed > Chair or W/C: Supervision (verbal cues/safety issues);5: Chair or W/C > Bed: Supervision (verbal cues/safety issues)  FIM - Locomotion: Wheelchair Distance: 160 Locomotion: Wheelchair: 5: Travels 150 ft or more: maneuvers on rugs and over door sills with supervision, cueing or coaxing FIM - Locomotion: Ambulation Locomotion: Ambulation Assistive Devices: Orthosis;Walker - Rolling Ambulation/Gait Assistance: 5: Supervision;4: Min guard Locomotion: Ambulation: 4: Travels 150 ft or more with minimal assistance (Pt.>75%)  Comprehension Comprehension Mode: Auditory Comprehension: 5-Follows basic conversation/direction: With extra time/assistive device  Expression Expression Mode: Verbal Expression: 5-Expresses basic 90% of the time/requires cueing < 10% of the time.  Social Interaction Social Interaction: 2-Interacts appropriately 25 - 49% of time - Needs frequent redirection.  Problem Solving Problem Solving: 4-Solves basic 75 - 89% of the time/requires cueing 10 - 24% of  the time  Memory Memory: 5-Recognizes or recalls 90% of the time/requires cueing < 10% of the time  Medical Problem List and Plan:  1. Functional deficits secondary to Gout flare and severe diabetic peripheral neuropathy.  2. DVT Prophylaxis/Anticoagulation: Pharmaceutical: Lovenox--monitor platelets with history of thrombocytopenia.  3. Pain Management: continue colchicine and prednisone.  4. Mood: NO signs of ETOH withdrawal noted. CIWA protocol. Continue prozac. LCSW to follow for evaluation and support.  5. Neuropsych: This patient is capable of making decisions on his own behalf.  6. Skin/Wound Care: Routine pressure relief measures.  7. Fluids/Electrolytes/Nutrition: Monitor strict I/O. Offer nutritional supplements if po intake poor.  8. Chronic diastolic HF: Check daily weights. Low salt diet. Continue lasix, coreg and lipitor. Oxygen at bedtime.  9. DM type 2 with polyneuropathy: Monitor BS with ac/hs checks. Continue lantus insulin with SS coverage as anticipate BS variation due to prednisone.  10. Alcohol abuse with gait disorder: Resume lactulose.  11. Metastatic breast cancer: Awaiting surgery.  12. Gout flare: Will taper prednisone as symptoms improving. On allopurinol in addition to colchicine.  13. Dizziness with diplopia: Has history of inner ear problems in the past. Will check MRI brain given persistent problems, hx breast CA, 14. Evidence of thrush, Diflucan, pharynx red but no more plaques 15 mild allergic conjunctivitis start eyedrops 16 cerumenosis to start drops LOS (Days) 8 A FACE TO FACE EVALUATION WAS PERFORMED  Aaban Griep E 08/01/2014, 8:47 AM

## 2014-08-02 ENCOUNTER — Encounter (HOSPITAL_COMMUNITY): Payer: Medicare Other | Admitting: Occupational Therapy

## 2014-08-02 ENCOUNTER — Ambulatory Visit (HOSPITAL_COMMUNITY): Payer: Medicare Other | Admitting: Physical Therapy

## 2014-08-02 DIAGNOSIS — I639 Cerebral infarction, unspecified: Secondary | ICD-10-CM

## 2014-08-02 DIAGNOSIS — M1000 Idiopathic gout, unspecified site: Secondary | ICD-10-CM

## 2014-08-02 DIAGNOSIS — I119 Hypertensive heart disease without heart failure: Secondary | ICD-10-CM

## 2014-08-02 LAB — LIPID PANEL
CHOL/HDL RATIO: 2.9 ratio
CHOLESTEROL: 100 mg/dL (ref 0–200)
HDL: 34 mg/dL — AB (ref >39)
LDL Cholesterol: 48 mg/dL (ref 0–99)
Triglycerides: 88 mg/dL (ref <150)
VLDL: 18 mg/dL (ref 0–40)

## 2014-08-02 LAB — GLUCOSE, CAPILLARY
GLUCOSE-CAPILLARY: 171 mg/dL — AB (ref 70–99)
GLUCOSE-CAPILLARY: 114 mg/dL — AB (ref 70–99)

## 2014-08-02 MED ORDER — ATORVASTATIN CALCIUM 20 MG PO TABS: 20.0000 mg | ORAL_TABLET | Freq: Every day | ORAL | Status: DC

## 2014-08-02 MED ORDER — ALLOPURINOL 300 MG PO TABS: 300.0000 mg | ORAL_TABLET | Freq: Every day | ORAL | Status: DC

## 2014-08-02 MED ORDER — CARVEDILOL 3.125 MG PO TABS: 3.1250 mg | ORAL_TABLET | Freq: Two times a day (BID) | ORAL | Status: DC

## 2014-08-02 MED ORDER — INSULIN GLARGINE 100 UNIT/ML ~~LOC~~ SOLN
8.0000 [IU] | Freq: Every day | SUBCUTANEOUS | Status: DC
Start: 2014-08-02 — End: 2015-03-06

## 2014-08-02 MED ORDER — GABAPENTIN 300 MG PO CAPS: 300.0000 mg | ORAL_CAPSULE | Freq: Two times a day (BID) | ORAL | Status: DC

## 2014-08-02 MED ORDER — COLCHICINE 0.6 MG PO TABS: 0.6000 mg | ORAL_TABLET | Freq: Two times a day (BID) | ORAL | Status: DC

## 2014-08-02 MED ORDER — LACTULOSE 10 GM/15ML PO SOLN: 20.0000 g | Freq: Two times a day (BID) | ORAL | Status: DC

## 2014-08-02 MED ORDER — ASPIRIN 325 MG PO TBEC: 325.0000 mg | DELAYED_RELEASE_TABLET | Freq: Every day | ORAL | Status: DC

## 2014-08-02 MED ORDER — DICLOFENAC SODIUM 1 % TD GEL: 2.0000 g | Freq: Four times a day (QID) | TRANSDERMAL | Status: DC

## 2014-08-02 MED ORDER — FLUTICASONE FUROATE 200 MCG/ACT IN AEPB: 1.0000 | INHALATION_SPRAY | Freq: Two times a day (BID) | RESPIRATORY_TRACT | Status: DC

## 2014-08-02 MED ORDER — LEVOTHYROXINE SODIUM 125 MCG PO TABS: 187.0000 ug | ORAL_TABLET | Freq: Every day | ORAL | Status: AC

## 2014-08-02 MED ORDER — FLUOXETINE HCL 20 MG PO CAPS: 20.0000 mg | ORAL_CAPSULE | Freq: Every day | ORAL | Status: DC

## 2014-08-02 MED ORDER — FLUCONAZOLE 100 MG PO TABS
100.0000 mg | ORAL_TABLET | Freq: Every day | ORAL | Status: DC
Start: 2014-08-02 — End: 2014-08-29

## 2014-08-02 MED ORDER — FUROSEMIDE 80 MG PO TABS: 160.0000 mg | ORAL_TABLET | Freq: Two times a day (BID) | ORAL | Status: DC

## 2014-08-02 MED ORDER — OXYCODONE HCL 10 MG PO TABS: 10.0000 mg | ORAL_TABLET | Freq: Four times a day (QID) | ORAL | Status: DC | PRN

## 2014-08-02 MED ORDER — FOLIC ACID 1 MG PO TABS: 1.0000 mg | ORAL_TABLET | Freq: Every day | ORAL | Status: DC

## 2014-08-02 MED ORDER — OMEPRAZOLE 20 MG PO CPDR: 20.0000 mg | DELAYED_RELEASE_CAPSULE | Freq: Every day | ORAL | Status: DC

## 2014-08-02 MED ORDER — POTASSIUM CHLORIDE CRYS ER 20 MEQ PO TBCR: 20.0000 meq | EXTENDED_RELEASE_TABLET | Freq: Every day | ORAL | Status: DC

## 2014-08-02 NOTE — Progress Notes (Signed)
*  PRELIMINARY RESULTS* Vascular Ultrasound Carotid Duplex (Doppler) has been completed.  Study was technically difficult due to poor patient cooperation. Unable to visualize the right internal carotid artery and left vertebral artery; this may be due to poor patient cooperation. The left internal carotid artery exhibits 1-39% stenosis. The right vertebral artery is patent with antegrade flow.  Unable to duplicate bilateral 13-64% internal carotid artery stenosis documented on prior exam 02/19/2014.  08/02/2014 11:57 AM Maudry Mayhew, RVT, RDCS, RDMS

## 2014-08-02 NOTE — Discharge Instructions (Signed)
Inpatient Rehab Discharge Instructions  SENG LARCH Discharge date and time:    Activities/Precautions/ Functional Status: Activity: activity as tolerated Diet: cardiac diet-Low salt Wound Care: none needed Functional status:  ___ No restrictions     ___ Walk up steps independently ___ 24/7 supervision/assistance   ___ Walk up steps with assistance ___ Intermittent supervision/assistance  ___ Bathe/dress independently ___ Walk with walker     ___ Bathe/dress with assistance ___ Walk Independently    ___ Shower independently ___ Walk with assistance    ___ Shower with assistance _X__ No alcohol     ___ Return to work/school ________  Special Instructions:    COMMUNITY REFERRALS UPON DISCHARGE:    Home Health:   PT, OT, RN  Agency:[facility] Phone: [phone] Date of last service:08/02/2014  Medical Equipment/Items Ordered:[facility] OTHER: DECLINED SUBSTANCE ABUSE RESOURCES  My questions have been answered and I understand these instructions. I will adhere to these goals and the provided educational materials after my discharge from the hospital.  Patient/Caregiver Signature _______________________________ Date __________  Clinician Signature _______________________________________ Date __________  Please bring this form and your medication list with you to all your follow-up doctor's appointments.

## 2014-08-02 NOTE — Progress Notes (Signed)
Patient and daughter received written and verbal discharge instructions from [place], [place]. Also reviewed by this RN. Clarified with patient/daughter and Linna Hoff the schedule for upcoming surgery, Lantus dose for home and how to increase dose if blood glucose rises above 150. Patient given diabetes book A-Z and  written literature provided about meal planning, carb counting,managing heart failure, etc. Reviewed medications given today. Patient and daughter deny questions or concerns. Personal belongings packed by patient and daughter. Patient taken down by wheelchair to private vehicle by NT. [place], [place] Selinda Eon

## 2014-08-02 NOTE — Progress Notes (Signed)
Social Work Discharge Note Discharge Note  The overall goal for the admission was met for:   Discharge location: [place] IN-LA[place]-24 HR SUPERVISION LEVEL  Length of Stay: Yes-9 DAYS  Discharge activity level: Yes-SUPERVISION/MIN LEVEL  Home/community participation: Yes  Services provided included: MD, RD, PT, OT, RN, CM, Pharmacy, Neuropsych and SW  Financial Services: Medicare and Private Insurance: [place]  Follow-up services arranged: Home Health: [facility], DME: [facility] and Patient/Family request agency HH: PREF USED BEFORE, DME: NO PREF  Comments (or additional information):FAMILY EDUCATION COMPLETED WITH DAUGHTER-SHE IS [place].  DISCUSSED ETOH AND HE PLANS TO QUIT AND HAS ALSO SPOKEN WITH NEURO-PSYCHOLOGIST AND MD.  AWARE NO DRIVING.  Patient/Family verbalized understanding of follow-up arrangements: Yes  Individual responsible for coordination of the follow-up plan: SELF & [name]-DAUGHTER  Confirmed correct DME delivered: Elease Hashimoto 08/02/2014    Elease Hashimoto

## 2014-08-02 NOTE — Plan of Care (Signed)
Problem: RH Ambulation Goal: LTG Patient will ambulate in home environment (PT) LTG: Patient will ambulate in home environment, # of feet with assistance (PT).  Outcome: Not Met (add Reason) Pt required supervision to min guard for ambulation in home environment.

## 2014-08-02 NOTE — Plan of Care (Signed)
Problem: Food- and Nutrition-Related Knowledge Deficit (NB-1.1) Goal: Nutrition education Formal process to instruct or train a patient/client in a skill or to impart knowledge to help patients/clients voluntarily manage or modify food choices and eating behavior to maintain or improve health. Outcome: Completed/Met Date Met:  08/02/14 Nutrition Education Note  RD consulted for nutrition education regarding pt's diet.   RD provided "Heart Healthy Nutrition Therapy" and "Type 2 Diabetes Nutrition Therapy" handouts from the [employer] of [employer] to pt/daughter. Reviewed patient's dietary recall. Provided examples on ways to decrease sodium and fat intake in diet. Discouraged intake of processed foods and use of salt shaker. Encouraged fresh fruits and vegetables as well as whole grain sources of carbohydrates to maximize fiber intake.   Discussed consistent carbohydrate intake. Discussed foods that contained carbohydrates.Review carbohydrate counting in meals. Emphasized the importance of protein intake. Encouraged proper hydration. Teach back method used.  Expect good compliance.  Body mass index is 37.54 kg/(m^2). Pt meets criteria for Class II obesity based on current BMI.  Current diet order is heart healthy/carbohydrate modified, patient is consuming approximately 100% of meals at this time. Labs and medications reviewed. No further nutrition interventions warranted at this time. RD contact information provided. If additional nutrition issues arise, please re-consult RD.  Kallie Locks, MS, RD, LDN Pager # [phone] After hours/ weekend pager # [phone]

## 2014-08-02 NOTE — Plan of Care (Signed)
Problem: RH Stairs Goal: LTG Patient will ambulate up and down stairs w/assist (PT) LTG: Patient will ambulate up and down # of stairs with assistance (PT)  Outcome: Completed/Met Date Met:  08/02/14 Pt surpassed this goal, as exhibited by ability to negotiate 5 stairs with 2 rails and supervision.

## 2014-08-02 NOTE — Progress Notes (Signed)
Physical Therapy Discharge Summary  Patient Details  Name: [name] MRN: [id number] Date of Birth: 1945/10/26  Today's Date: 08/02/2014 PT Individual Time: 1105-1205 PT Individual Time Calculation (min): 60 min    Patient has met 6 of 8 long term goals due to improved activity tolerance, improved balance, improved postural control, increased strength, ability to compensate for deficits, improved awareness and improved coordination.  Patient to discharge at an ambulatory level [place] for household distances; supervision at wheelchair level for community distances. Patient's care partner is independent to provide the necessary physical and cognitive assistance at discharge.  Reasons goals not met: Goal addressing gait with supervision in home environment not met due to pt requiring min guard for gait in home environment; goal for car transfer not met due to pt requiring min A for car transfer during hands-on family training on day of discharge.  Recommendation:  Patient will benefit from ongoing skilled PT services in home health setting to continue to advance safe functional mobility, address ongoing impairments in stability/independence with functional mobility, adaptation/compensation for vestibular impairments, and minimize fall risk.  Equipment: left ankle-foot orthotic ([place] WalkOn)  Reasons for discharge: treatment goals met and discharge from hospital  Patient/family agrees with progress made and goals achieved: Yes  Skilled Therapeutic Interventions/Progress Updates Session focused on hands-on family training with patient's daughter, [name]. Pt wearing L AFO (WalkOn) and utilizing wide rolling walker throughout session. This therapist explained and demonstrated the following: providing min guard-min A with gait x100' in controlled environment with rolling walker; providing min guard-min A with lateral negotiation of 6 stairs with bilat UE support at L rail (per daughter  report of pt being unable to utilize bilat rails due to storm door); stand pivot transfer from w/c<>mat table with rolling walker and min guard; sit<>stand transfer from simulated car with rolling walker. Pt's daughter, [name], gave effective return demonstration of all said aspects of assist and safe/appropriate cueing with all described aspects of functional mobility. Educated daughter on disequilibrium caused by peripheral neuropathy and cerebellar stroke as well as compensatory strategies to promote safety with functional mobility. Compensatory strategies emphasized were visual targeting during functional mobility, ambulation, and stair negotiation as well as turning eyes, head, then body toward target. Pt/daughter report feeling comfortable with all aspects of functional mobility. Session ended in pt room,where pt was left seated in w/c with daughter and registered dietician present and all needs within reach.  PT Discharge Precautions/Restrictions Precautions Precautions: None Precaution Comments: L AFO with gait Restrictions Weight Bearing Restrictions: No Vital Signs   Therapy Vitals  Pulse Rate: 75 Resp: 18  BP: 120/48 mmHg  Patient Position (if appropriate): Sitting  Pain Pain Assessment Pain Assessment: No/denies pain Vision/Perception  Vision - Assessment Eye Alignment: Within Functional Limits Ocular Range of Motion: Within Functional Limits Tracking/Visual Pursuits: Able to track stimulus in all quads without difficulty  Cognition Overall Cognitive Status: Within Functional Limits for tasks assessed Arousal/Alertness: Awake/alert Orientation Level: Oriented X4 Attention: Focused;Sustained Sustained Attention: Impaired Sustained Attention Impairment: Functional basic Memory: Appears intact Awareness: Impaired Problem Solving: Impaired Safety/Judgment: Appears intact Comments: anxious, fearful of falling Sensation Sensation Light Touch: Impaired Detail Light Touch  Impaired Details: Impaired RLE;Impaired LLE;Impaired RUE;Impaired LUE Stereognosis: Not tested Hot/Cold: Appears Intact Proprioception Impaired Details: Impaired LLE;Impaired RLE;Impaired RUE;Impaired LUE Additional Comments: Light touch impaired in bilat LE's (stocking distribution) due to longstanding peripheral neuropathy, per pt. Coordination Gross Motor Movements are Fluid and Coordinated: No Fine Motor Movements are Fluid and Coordinated: No  Motor  Motor Motor: Abnormal postural alignment and control Motor - Discharge Observations: lateral trunk lean to L side in standing more prominent without UE support, dynamic standing, and fatigue  Mobility Bed Mobility Bed Mobility: Supine to Sit;Sit to Supine Supine to Sit: HOB flat;6: Modified independent (Device/Increase time) Sit to Supine: 6: Modified independent (Device/Increase time);HOB flat Transfers Transfers: Yes Sit to Stand: 5: Supervision;From bed;From chair/3-in-1 Stand to Sit: 5: Supervision;To chair/3-in-1;To bed Stand Pivot Transfers: 4: Min guard;Other (comment);With armrests;5: Supervision (using rolling walker) Locomotion  Ambulation Ambulation: Yes Ambulation/Gait Assistance: 5: Supervision;4: Min guard;4: Min assist Ambulation Distance (Feet): 75 Feet Assistive device: Rolling walker;Other (Comment) (L AFO) Ambulation/Gait Assistance Details: Pt able to ambulate 75-200' in controlled and home environments with wide rolling walker, L AFO (WalkOn), and supervision to min A; distance and assistance required vary based on on pt-perceived disequilibrium, fear of falling Gait Gait: Yes Gait Pattern: Impaired Gait Pattern: Step-through pattern;Lateral trunk lean to left;Trunk flexed;Wide base of support;Left steppage High Level Ambulation High Level Ambulation: Side stepping;Backwards walking Side Stepping: Requires min guard using rolling walker with verbal cueing for proximity to chair/sitting surface due to pt  anxiety, fear of falling Backwards Walking: x2 steps with rolling walker and min A; cueing as described for side stepping Stairs / Additional Locomotion Stairs: Yes Stairs Assistance: 5: Supervision Stairs Assistance Details: Verbal cues for precautions/safety Stairs Assistance Details (indicate cue type and reason): Pt able to negotate 5 stairs forward-facing with bilat rails with step-to pattern and supervision, cueing to utilize visual targeting to prevent disquilibrium; pt requires min guard-min A only to negotiate stairs laterallly with bilat UE support at L rail to simulate home environment. Stair Management Technique: Two rails;Step to pattern;Forwards Number of Stairs: 5 Ramp: 4: Min assist;Other (comment) (using rolling walker) Curb: 4: Min assist;Other (comment) (using rolling walker) Wheelchair Mobility Wheelchair Mobility: Yes Wheelchair Assistance: 5: Careers information officer: Both lower extermities Wheelchair Parts Management: Needs assistance Distance: 150  Trunk/Postural Assessment  Cervical Assessment Cervical Assessment: Exceptions to [facility] (forward head) Thoracic Assessment Thoracic Assessment: Exceptions to [facility] (kyphosis) Lumbar Assessment Lumbar Assessment: Exceptions to [facility] (posterior pelvic tilt) Postural Control Postural Control: Deficits on evaluation Trunk Control: L lateral lean in standing Righting Reactions: No hip, ankle, or stepping strategies with posterior LOB  Balance Balance Balance Assessed: Yes Static Sitting Balance Static Sitting - Balance Support: Feet supported;No upper extremity supported Static Sitting - Level of Assistance: 7: Independent Dynamic Sitting Balance Dynamic Sitting - Balance Support: No upper extremity supported;Feet supported Dynamic Sitting - Level of Assistance: 6: Modified independent (Device/Increase time) Static Standing Balance Static Standing - Balance Support: Right upper extremity supported Static  Standing - Level of Assistance: 5: Stand by assistance Dynamic Standing Balance Dynamic Standing - Balance Support: Bilateral upper extremity supported Dynamic Standing - Level of Assistance: 4: Min assist Extremity Assessment  RLE Strength RLE Overall Strength: Deficits RLE Overall Strength Comments: Grossly 4-/5 overall. LLE Assessment LLE Assessment: Exceptions to WFL LLE AROM (degrees) Overall AROM Left Lower Extremity: Within functional limits for tasks assessed LLE Strength LLE Overall Strength: Deficits LLE Overall Strength Comments: Grossly 4-/5 L hip abduction, extension;  4/5 L hip/knee flexion and ankle plantarflexion; 3/5 L ankle dorsiflexion  See FIM for current functional status  Hobble, Malva Cogan 08/02/2014, 6:40 PM

## 2014-08-02 NOTE — Progress Notes (Signed)
STROKE TEAM PROGRESS NOTE   HISTORY [name] is an 68 y.o. male with HTN, CAF, ongoing alcohol abuse, chronic pain, who was admitted on 07/21/14 with fatigue, increased weakness and BLE edema. He was treated with IV lasix due to complaints of SOB and Dr. Haroldine [name] consulted for input. CT head with moderate generalized atrophy with progression since 2011. He felt that volume status was stable but patient with polyarticular gout flare in setting of ETOH abuse and added steroids for treatment. While in hospital HE has been complaining of feeling off balance and while on rehab noted he felt as though he was drifting to the left while he walked. He notes he feels light headed if he looks sown while walking. He denies any diplopia, blurred vision, dysarthria. Patient has known Afib but has not been placed on AC due to fall risk and on going ETOH abuse. Neurology was asked to evaluate. Unable to determine last known well, therefore, not a candidate for tPA.   SUBJECTIVE (INTERVAL HISTORY) No new complaints - pt lying in bed, tired from therapy.   OBJECTIVE Temp:  [97.7 F (36.5 C)-98 F (36.7 C)] 97.7 F (36.5 C) (10/23 0639) Pulse Rate:  [73-75] 75 (10/23 0639) Cardiac Rhythm:  [-]  Resp:  [18] 18 (10/23 0639) BP: (120-152)/(48-78) 120/48 mmHg (10/23 0639) SpO2:  [93 %-97 %] 95 % (10/23 0930) FiO2 (%):  [21 %] 21 % (10/22 2120) Weight:  [110.4 kg (243 lb 6.2 oz)] 110.4 kg (243 lb 6.2 oz) (10/23 0639)   Recent Labs Lab 08/01/14 0703 08/01/14 1124 08/01/14 1645 08/01/14 2116 08/02/14 0712  GLUCAP 141* 126* 127* 183* 171*    Recent Labs Lab 07/27/14 0505 07/28/14 0636 07/29/14 0545 07/31/14 0700  NA 137 138 139 138  K 3.7 3.4* 3.6* 4.2  CL 93* 94* 94* 94*  CO2 33* 33* 33* 30  GLUCOSE 156* 125* 117* 143*  BUN 45* 46* 49* 54*  CREATININE 0.92 0.93 1.05 1.19  1.21  CALCIUM 9.2 9.4 9.3 9.4    Recent Labs Lab 07/27/14 1135  WBC 6.7  NEUTROABS 4.4  HGB 12.2*  HCT  37.5*  MCV 94.9  PLT 137*       Component Value Date/Time   CHOL 100 08/02/2014 0524   TRIG 88 08/02/2014 0524   HDL 34* 08/02/2014 0524   CHOLHDL 2.9 08/02/2014 0524   VLDL 18 08/02/2014 0524   LDLCALC 48 08/02/2014 0524   Lab Results  Component Value Date   HGBA1C 6.8* 07/21/2014      Component Value Date/Time   LABOPIA POSITIVE* 07/21/2014 2152   COCAINSCRNUR NONE DETECTED 07/21/2014 2152   LABBENZ NONE DETECTED 07/21/2014 2152   AMPHETMU NONE DETECTED 07/21/2014 2152   THCU NONE DETECTED 07/21/2014 2152   LABBARB NONE DETECTED 07/21/2014 2152     Ct Head Wo Contrast 08/01/2014    Evolving LEFT posterior inferior cerebellar artery territory infarct with petechial hemorrhage.  Involutional changes. Mild white matter changes suggest chronic small vessel ischemic disease. LEFT mesial frontal lobe encephalomalacia is likely post ischemic.    07/21/2014 1. No acute intracranial abnormality. 2. Moderate generalized atrophy, progressive since 2011. Stable mild chronic microvascular ischemic changes of the white matter.   Mr Brain Wo Contrast 07/31/2014    Moderate-sized acute infarction affecting the LEFT inferior cerebellum, with hemorrhagic transformation medially. Generalized atrophy with small vessel disease.    Mr [name] Head Wo Contrast 07/31/2014      LEFT PICA thrombosis. Suspect adjacent  LEFT vertebral dissection or focal plaque.   2D Echocardiogram  04/21/2014 EF 50-55% with no source of embolus.   Carotid Doppler  Study was technically difficult due to poor patient cooperation. Unable to visualize the right internal carotid artery and left vertebral artery; this may be due to poor patient cooperation.  The left internal carotid artery exhibits 1-39% stenosis. The right vertebral artery is patent with antegrade flow.  Unable to duplicate bilateral 22-48% internal carotid artery stenosis documented on prior exam 02/19/2014.    PHYSICAL EXAM Obese elderly Caucasian male  not in distress.Awake alert. Afebrile. Head is nontraumatic. Neck is supple without bruit. Hearing is normal. Cardiac exam no murmur or gallop. Lungs are clear to auscultation. Distal pulses are well felt. Neurological Exam : Awake alert oriented x3. Normal speech and language. No aphasia, dysarthria or apraxia. Fundi were not visualized. Vision acuity and fields appear normal. Extraocular movements are full range without nystagmus. Face is symmetric. Tongue is midline. Motor system exam reveals no upper extremity drift. Symmetric and equal strength in all 4 limbs. Mild left finger-to-nose dysmetria. No need to his dysmetria. The deep tendon reflexes are depressed. Sensation is intact. Gait was not tested. ASSESSMENT/PLAN [name] is a 68 y.o. male with history of HTN, CAF, ongoing alcohol abuse, chronic pain who was diagnosed with moderate L inferior cerebellar infarct with hemorrhagic transformation after noting "leaning to the left" while on rehab following admission for dyspnea. He did not receive IV t-PA due to unknown time of onset.   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 Leonie [name] to read  2D Echo  No source of embolus   aspirin 81 mg orally every day prior to admission, now on no antithrombotics. given hx ongoing etoh use and falls, do not recommend anticoagulation. Have added aspirin 325 mg daily (hemorrhagic transformation is not significant enough to preclude).  Ongoing aggressive risk factor management  Continued rehab care  Atrial Fibrillation  CHA2DS2-VASc Score ?2, oral anticoagulation recommended  iven hx ongoing etoh use and falls, do not recommend anticoagulation. Have added aspirin 325 mg daily (hemorrhagic transformation is not significant enough to  preclude).  Hypertension  Stable  Hyperlipidemia  Home meds:  lipitor 20, resumed   LDL 48, at goal < 70  Continue statin at discharge  Diabetes  HgbA1c 6.8 goal < 7.0  Controlled  Other Stroke Risk Factors Advanced age Former Cigarette smoker, stopped 1970 ETOH use   Obesity, Body mass index is 37.54 kg/(m^2).    Family hx stroke (mother, father)   Coronary artery disease - s/p CABG Obstructive sleep apnea, on CPAP at home Carotid stenosis s/p L CEA 2002, stable patch in 2012  Other Active Problems  Thrombocytopenia. PLT 136  Chronic back pain  Hospital day # [id number] [place] [address], MSN, RN, ANVP-BC, ANP-BC, [facility] Pager: [phone] 08/02/2014 9:55 AM  I have personally examined this patient, reviewed notes, independently viewed imaging studies, participated in medical decision making and plan of care. I have made any additions or clarifications directly to the above note. Agree with note above.   Antony Contras, MD Medical Director [facility] Pager: [phone] 08/02/2014 9:55 AM

## 2014-08-02 NOTE — Progress Notes (Signed)
Occupational Therapy Session Note  Patient Details  Name: [name] MRN: [id number] Date of Birth: 1945-11-03  Today's Date: 08/02/2014 OT Individual Time: 1003-1050 OT Individual Time Calculation (min): 47 min    Short Term Goals: Week 1:  OT Short Term Goal 1 (Week 1): Pt will engage in 10 minutes of functional task without requiring rest break in order to increase endurance for functional tasks.  OT Short Term Goal 2 (Week 1): Pt will perform LB dressing with Mod A in order to increase I in self care. OT Short Term Goal 3 (Week 1): Pt will perform toileting with Mod A in order to increase I in self care. OT Short Term Goal 4 (Week 1): Pt will perform shower transfer with Min A in order to increase I in functional transfer.  OT Short Term Goal 5 (Week 1): Pt will perform toilet transfer with Min A in order to increase I in functional transfers.  Skilled Therapeutic Interventions/Progress Updates:    Engaged in hands on family education with daughter, [name], with focus on increased independence with self-care tasks.  Educated on energy conservation strategies and use of DME (shower chair) for energy conservation to which pt adamantly reported "No!" he will not use the shower chair.  Pt ambulated to bathroom with RW and supervision, requested [facility] for home - informed SWK. Pt refused to get dressed, stating he would go home in his pajamas.  Educated daughter on recommended side stepping in to shower and use of AE and DME (as pt will allow) to increase safety and energy conservation.  Pt's daughter tearful during session, reporting he says he will do things but she is afraid he will go back to before.  She is concerned about his drinking and progression of his balance issues.  Provided encouragement to daughter as well as encouraged pt to continue to attempt to get OOB and increase participation in all self-care tasks.  PA present at beginning of session as daughter concerned of pt's  disposition and change in medications.    Therapy Documentation Precautions:  Precautions Precautions: None Precaution Comments: Make sure Teds are on prior to standing/gait; check skin after orthosis use Restrictions Weight Bearing Restrictions: No General:   Vital Signs: Oxygen Therapy SpO2: 95 % O2 Device: Not Delivered Pain: Pain Assessment Pain Assessment: 0-10 Pain Score: 0-No pain Patients Stated Pain Goal: 3 Multiple Pain Sites: No  See FIM for current functional status  Therapy/Group: Individual Therapy  [name] 08/02/2014, 11:24 AM

## 2014-08-02 NOTE — Plan of Care (Signed)
Problem: RH Car Transfers Goal: LTG Patient will perform car transfers with assist (PT) LTG: Patient will perform car transfers with assistance (PT).  Outcome: Not Met (add Reason) On day of hands-on family training, pt required min A with car transfer.

## 2014-08-02 NOTE — Progress Notes (Signed)
Patient ID: [name], male   DOB: 11/05/1945, 68 y.o.   MRN: [id number]   Subjective/Complaints: 68 y.o. male with history of OSA, HTN, CAF, ongoing alcohol abuse, chronic pain; who was admitted on 07/21/14 with fatigue, increased weakness and BLE edema. He was treated with IV lasix due to complaints of SOB and Dr. Haroldine Laws consulted for input. CT head with moderate generalized atrophy with progression since 2011. He felt that volume status was stable but patient with polyarticular gout flare in setting of ETOH abuse and added steroids for treatment. Uric acid 11.1 with TSH 5.74. X rays lumbar spine with extensive spondylosis and osteoarthritic change with diffuse idiopathic skeletal hyperostosis. Therapy evaluation done and patient with significant balance deficits with ataxia as well as difficulty with mobility due to gout flare  No new issues. Ready to go home today. Denies pain, new weakness/numbness Review of systems is negative except as above. He does have bilateral foot drop noted worse on the left side Objective: Vital Signs: Blood pressure 120/48, pulse 75, temperature 97.7 F (36.5 C), temperature source Oral, resp. rate 18, height 5' 7.5" (1.715 m), weight 110.4 kg (243 lb 6.2 oz), SpO2 93.00%. Ct Head Wo Contrast  08/01/2014   CLINICAL DATA:  Dizziness and weakness, stroke.  EXAM: CT HEAD WITHOUT CONTRAST  TECHNIQUE: Contiguous axial images were obtained from the base of the skull through the vertex without intravenous contrast.  COMPARISON:  MRI of the head July 31, 2014  FINDINGS: LEFT inferior cerebellar hypodensity with superimposed punctate densities medially corresponding to susceptibility artifact. The ventricles and sulci are overall normal for patient's age. No midline shift or mass effect. Mild white matter changes suggest chronic small vessel ischemic disease; LEFT frontal mesial transcortical encephalomalacia is likely ischemic in etiology. RIGHT parietal convexity  extra-axial presumed small subarachnoid cyst. Basal cisterns are patent. Severe calcific atherosclerosis of the carotid siphons.  No paranasal sinus air-fluid levels. Tiny RIGHT maxillary mucous retention cyst. Mastoid air cells are well-aerated. No skull fracture. Ocular globes and orbital contents are unremarkable.  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  Involutional changes. Mild white matter changes suggest chronic small vessel ischemic disease. LEFT mesial frontal lobe encephalomalacia is likely post ischemic.   Electronically Signed   By: Elon Alas   On: 08/01/2014 06:00   [name] Head Wo Contrast  07/31/2014   CLINICAL DATA:  Dizziness, recent frequent falls, generalized weakness. Alcohol abuse. Metastatic breast cancer. Diabetes mellitus.  EXAM: MRI HEAD WITHOUT CONTRAST  MRA HEAD WITHOUT CONTRAST  TECHNIQUE: Multiplanar, multiecho pulse sequences of the brain and surrounding structures were obtained without intravenous contrast. Angiographic images of the head were obtained using MRA technique without contrast.  COMPARISON:  CT head 07/21/2014.  FINDINGS: MRI HEAD FINDINGS  There is a moderate-sized area of acute infarction affecting the LEFT inferior cerebellum. Laterally there is largely confluent but scattered focal areas of restricted diffusion lying within the LEFT posterior inferior cerebellar artery territory. Medially, also involving the tonsil, there is susceptibility artifact as seen on gradient, T2, and diffusion sequence consistent with hemorrhagic transformation. This should be monitored carefully with CT. There is no shift of the fourth ventricle or significant deformity to imply impending hydrocephalus. No tonsillar herniation.  Generalized cerebral and cerebellar atrophy is advanced for the patient's age. There is extensive T2 and FLAIR hyperintensity representing chronic microvascular ischemic change. There is  an old LEFT frontal infarct. Tiny foci of chronic hemorrhage can be seen  elsewhere in the brain, likely sequelae of hypertension.  Flow voids are maintained in the carotid, basilar, and LEFT vertebral arteries. The RIGHT vertebral appears to end in PICA. The LEFT vertebral appears moderately diseased, described below.  No midline abnormality. No sinus disease. Negative orbits. No mastoid fluid. Negative osseous structures.  MRA HEAD FINDINGS  The internal carotid arteries demonstrate mild irregularity in their cavernous segments. BILATERAL ICA termini are widely patent.  Basilar artery is widely patent with LEFT vertebral a sole contributor.  There is diminished flow related enhancement in the LEFT vertebral particularly in its distal V4 segment. There is considerable motion degradation, but it appears likely that there is focal plaque or localized dissection within the LEFT vertebral. This is also suggested on the gradient sequence (images 24-28 series 9). Other than a short stump representing its origin (image 32 series 8), there is no appreciable flow related enhancement in the LEFT PICA.  No proximal stenosis of the middle cerebral arteries. Atretic or severely diseased A1 ACA on the RIGHT. Azygos LEFT ACA supplies both anterior cerebrals. 75% stenosis of the distal P1 segment RIGHT PCA. There is prominence at the origin of the LEFT posterior communicating artery. I suspect this represents an infundibulum.  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 Recommend continued surveillance with CT scanning to exclude expanding posterior fossa hematoma.  LEFT PICA thrombosis. Suspect adjacent LEFT vertebral dissection or focal plaque.  Generalized atrophy with small vessel disease.  Findings discussed with ordering provider.   Electronically Signed   By: Rolla Flatten M.D.   On: 07/31/2014 14:56   Mr Brain Wo Contrast  07/31/2014    CLINICAL DATA:  Dizziness, recent frequent falls, generalized weakness. Alcohol abuse. Metastatic breast cancer. Diabetes mellitus.  EXAM: MRI HEAD WITHOUT CONTRAST  MRA HEAD WITHOUT CONTRAST  TECHNIQUE: Multiplanar, multiecho pulse sequences of the brain and surrounding structures were obtained without intravenous contrast. Angiographic images of the head were obtained using MRA technique without contrast.  COMPARISON:  CT head 07/21/2014.  FINDINGS: MRI HEAD FINDINGS  There is a moderate-sized area of acute infarction affecting the LEFT inferior cerebellum. Laterally there is largely confluent but scattered focal areas of restricted diffusion lying within the LEFT posterior inferior cerebellar artery territory. Medially, also involving the tonsil, there is susceptibility artifact as seen on gradient, T2, and diffusion sequence consistent with hemorrhagic transformation. This should be monitored carefully with CT. There is no shift of the fourth ventricle or significant deformity to imply impending hydrocephalus. No tonsillar herniation.  Generalized cerebral and cerebellar atrophy is advanced for the patient's age. There is extensive T2 and FLAIR hyperintensity representing chronic microvascular ischemic change. There is an old LEFT frontal infarct. Tiny foci of chronic hemorrhage can be seen elsewhere in the brain, likely sequelae of hypertension.  Flow voids are maintained in the carotid, basilar, and LEFT vertebral arteries. The RIGHT vertebral appears to end in PICA. The LEFT vertebral appears moderately diseased, described below.  No midline abnormality. No sinus disease. Negative orbits. No mastoid fluid. Negative osseous structures.  MRA HEAD FINDINGS  The internal carotid arteries demonstrate mild irregularity in their cavernous segments. BILATERAL ICA termini are widely patent.  Basilar artery is widely patent with LEFT vertebral a sole contributor.  There is diminished flow related enhancement in the  LEFT vertebral particularly in its distal V4 segment. There is considerable motion degradation, but it appears likely that there is focal plaque or localized dissection within the LEFT  vertebral. This is also suggested on the gradient sequence (images 24-28 series 9). Other than a short stump representing its origin (image 32 series 8), there is no appreciable flow related enhancement in the LEFT PICA.  No proximal stenosis of the middle cerebral arteries. Atretic or severely diseased A1 ACA on the RIGHT. Azygos LEFT ACA supplies both anterior cerebrals. 75% stenosis of the distal P1 segment RIGHT PCA. There is prominence at the origin of the LEFT posterior communicating artery. I suspect this represents an infundibulum.  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 Recommend continued surveillance with CT scanning to exclude expanding posterior fossa hematoma.  LEFT PICA thrombosis. Suspect adjacent LEFT vertebral dissection or focal plaque.  Generalized atrophy with small vessel disease.  Findings discussed with ordering provider.   Electronically Signed   By: Rolla Flatten M.D.   On: 07/31/2014 14:56   Results for orders placed during the hospital encounter of 07/24/14 (from the past 72 hour(s))  GLUCOSE, CAPILLARY     Status: Abnormal   Collection Time    07/30/14 12:12 PM      Result Value Ref Range   Glucose-Capillary 167 (*) 70 - 99 mg/dL  GLUCOSE, CAPILLARY     Status: Abnormal   Collection Time    07/30/14  4:35 PM      Result Value Ref Range   Glucose-Capillary 104 (*) 70 - 99 mg/dL  GLUCOSE, CAPILLARY     Status: Abnormal   Collection Time    07/30/14  9:30 PM      Result Value Ref Range   Glucose-Capillary 141 (*) 70 - 99 mg/dL   Comment 1 Notify RN    CREATININE, SERUM     Status: Abnormal   Collection Time    07/31/14  7:00 AM      Result Value Ref Range   Creatinine, Ser 1.19  0.50 - 1.35 mg/dL    GFR calc non Af Amer 60 (*) >90 mL/min   GFR calc Af Amer 70 (*) >90 mL/min   Comment: (NOTE)     The eGFR has been calculated using the CKD EPI equation.     This calculation has not been validated in all clinical situations.     eGFR's persistently <90 mL/min signify possible Chronic Kidney     Disease.  BASIC METABOLIC PANEL     Status: Abnormal   Collection Time    07/31/14  7:00 AM      Result Value Ref Range   Sodium 138  137 - 147 mEq/L   Potassium 4.2  3.7 - 5.3 mEq/L   Chloride 94 (*) 96 - 112 mEq/L   CO2 30  19 - 32 mEq/L   Glucose, Bld 143 (*) 70 - 99 mg/dL   BUN 54 (*) 6 - 23 mg/dL   Creatinine, Ser 1.21  0.50 - 1.35 mg/dL   Calcium 9.4  8.4 - 10.5 mg/dL   GFR calc non Af Amer 59 (*) >90 mL/min   GFR calc Af Amer 68 (*) >90 mL/min   Comment: (NOTE)     The eGFR has been calculated using the CKD EPI equation.     This calculation has not been validated in all clinical situations.     eGFR's persistently <90 mL/min signify possible Chronic Kidney     Disease.   Anion gap 14  5 - 15  GLUCOSE, CAPILLARY     Status: Abnormal   Collection Time  07/31/14  7:38 AM      Result Value Ref Range   Glucose-Capillary 155 (*) 70 - 99 mg/dL  GLUCOSE, CAPILLARY     Status: Abnormal   Collection Time    07/31/14 12:09 PM      Result Value Ref Range   Glucose-Capillary 131 (*) 70 - 99 mg/dL  GLUCOSE, CAPILLARY     Status: Abnormal   Collection Time    07/31/14  4:53 PM      Result Value Ref Range   Glucose-Capillary 134 (*) 70 - 99 mg/dL  GLUCOSE, CAPILLARY     Status: Abnormal   Collection Time    07/31/14  9:13 PM      Result Value Ref Range   Glucose-Capillary 185 (*) 70 - 99 mg/dL   Comment 1 Notify RN    GLUCOSE, CAPILLARY     Status: Abnormal   Collection Time    08/01/14  7:03 AM      Result Value Ref Range   Glucose-Capillary 141 (*) 70 - 99 mg/dL  GLUCOSE, CAPILLARY     Status: Abnormal   Collection Time    08/01/14 11:24 AM      Result Value Ref Range    Glucose-Capillary 126 (*) 70 - 99 mg/dL  GLUCOSE, CAPILLARY     Status: Abnormal   Collection Time    08/01/14  4:45 PM      Result Value Ref Range   Glucose-Capillary 127 (*) 70 - 99 mg/dL  GLUCOSE, CAPILLARY     Status: Abnormal   Collection Time    08/01/14  9:16 PM      Result Value Ref Range   Glucose-Capillary 183 (*) 70 - 99 mg/dL   Comment 1 Notify RN    LIPID PANEL     Status: Abnormal   Collection Time    08/02/14  5:24 AM      Result Value Ref Range   Cholesterol 100  0 - 200 mg/dL   Triglycerides 88  <150 mg/dL   HDL 34 (*) >39 mg/dL   Total CHOL/HDL Ratio 2.9     VLDL 18  0 - 40 mg/dL   LDL Cholesterol 48  0 - 99 mg/dL   Comment:            Total Cholesterol/HDL:CHD Risk     Coronary Heart Disease Risk Table                         Men   Women      1/2 Average Risk   3.4   3.3      Average Risk       5.0   4.4      2 X Average Risk   9.6   7.1      3 X Average Risk  23.4   11.0                Use the calculated Patient Ratio     above and the CHD Risk Table     to determine the patient's CHD Risk.                ATP III CLASSIFICATION (LDL):      <100     mg/dL   Optimal      100-129  mg/dL   Near or Above  Optimal      130-159  mg/dL   Borderline      160-189  mg/dL   High      >190     mg/dL   Very High  GLUCOSE, CAPILLARY     Status: Abnormal   Collection Time    08/02/14  7:12 AM      Result Value Ref Range   Glucose-Capillary 171 (*) 70 - 99 mg/dL     HEENT:  , left eye with some dried drainage at the medial canthus, throat without plaques + erythema Cardio: RRR and No murmur Resp: CTA B/L and Unlabored GI: BS positive and Nontender nondistended Extremity:  No Edema, multiple arthritic deformities both hands Heberden's nodules  Neuro: Lethargic but awakens to voice. Musc/Skel:  Other No pain with upper extremity or lower extremity range of motion, no evidence of joint swelling Gen. no acute distress No limb  ataxia Normal LT sensation face and hands sittin balance is good   Assessment/Plan: 1. Functional deficits secondary to Gout flare and severe diabetic neuropathy which require 3+ hours per day of interdisciplinary therapy in a comprehensive inpatient rehab setting. Physiatrist is providing close team supervision and 24 hour management of active medical problems listed below. Physiatrist and rehab team continue to assess barriers to discharge/monitor patient progress toward functional and medical goals.  3103m ASA recommended by neurology. Patient is aware.  Dc home today   FIM: FIM - Bathing Bathing Steps Patient Completed: Chest;Right Arm;Left Arm;Abdomen;Right upper leg;Left upper leg;Front perineal area;Right lower leg (including foot);Left lower leg (including foot) Bathing: 4: Min-Patient completes 8-9 [id number] parts or 75+ percent  FIM - Upper Body Dressing/Undressing Upper body dressing/undressing steps patient completed: Thread/unthread right sleeve of pullover shirt/dresss;Thread/unthread left sleeve of pullover shirt/dress;Put head through opening of pull over shirt/dress;Pull shirt over trunk Upper body dressing/undressing: 5: Set-up assist to: Obtain clothing/put away FIM - Lower Body Dressing/Undressing Lower body dressing/undressing steps patient completed: Thread/unthread right pants leg;Thread/unthread left pants leg;Pull pants up/down;Don/Doff right shoe;Fasten/unfasten right shoe;Fasten/unfasten left shoe Lower body dressing/undressing: 4: Min-Patient completed 75 plus % of tasks  FIM - Toileting Toileting steps completed by patient: Adjust clothing prior to toileting;Adjust clothing after toileting Toileting Assistive Devices: Grab bar or rail for support Toileting: 3: Mod-Patient completed 2 of 3 steps  FIM - ToRadio producerevices: Grab bars;Walker Toilet Transfers: 5-To toilet/BSC: Supervision (verbal cues/safety issues);5-From  toilet/BSC: Supervision (verbal cues/safety issues)  FIM - BeControl and instrumentation engineerevices: Walker;Orthosis;Arm rests Bed/Chair Transfer: 6: More than reasonable amt of time;6: Sit > Supine: No assist;6: Supine > Sit: No assist;5: Bed > Chair or W/C: Supervision (verbal cues/safety issues);5: Chair or W/C > Bed: Supervision (verbal cues/safety issues)  FIM - Locomotion: Wheelchair Distance: 150 Locomotion: Wheelchair: 5: Travels 150 ft or more: maneuvers on rugs and over door sills with supervision, cueing or coaxing FIM - Locomotion: Ambulation Locomotion: Ambulation Assistive Devices: Orthosis;Walker - Rolling Ambulation/Gait Assistance: 5: Supervision;4: Min guard Locomotion: Ambulation: 2: Travels 50 - 149 ft with minimal assistance (Pt.>75%)  Comprehension Comprehension Mode: Auditory Comprehension: 5-Follows basic conversation/direction: With extra time/assistive device  Expression Expression Mode: Verbal Expression: 5-Expresses basic 90% of the time/requires cueing < 10% of the time.  Social Interaction Social Interaction: 2-Interacts appropriately 25 - 49% of time - Needs frequent redirection.  Problem Solving Problem Solving: 4-Solves basic 75 - 89% of the time/requires cueing 10 - 24% of the time  Memory Memory: 5-Recognizes or recalls 90% of  the time/requires cueing < 10% of the time  Medical Problem List and Plan:  1. Functional deficits secondary to Gout flare and severe diabetic peripheral neuropathy.  2. DVT Prophylaxis/Anticoagulation: Pharmaceutical: Lovenox- .  3. Pain Management: continue colchicine and prednisone.  4. Mood: NO signs of ETOH withdrawal noted. CIWA protocol. Continue prozac. LCSW to follow for evaluation and support.  5. Neuropsych: This patient is capable of making decisions on his own behalf.  6. Skin/Wound Care: Routine pressure relief measures.  7. Fluids/Electrolytes/Nutrition: Monitor strict I/O. Offer nutritional  supplements if po intake poor.  8. Chronic diastolic HF: Check daily weights. Low salt diet. Continue lasix, coreg and lipitor. Oxygen at bedtime.  9. DM type 2 with polyneuropathy: Monitor BS with ac/hs checks. Continue lantus insulin  10. Alcohol abuse with gait disorder: Resume lactulose.  11. Metastatic breast cancer: Awaiting surgery.  12. Gout flare: Will taper prednisone as symptoms improving. On allopurinol in addition to colchicine.  13. Dizziness with diplopia: Has history of inner ear problems in the past.  14. Evidence of thrush, Diflucan, pharynx red but no more plaques 15 mild allergic conjunctivitis start eyedrops 16 cerumenosis LOS (Days) 9 A FACE TO FACE EVALUATION WAS PERFORMED  SWARTZ,ZACHARY T 08/02/2014, 8:54 AM

## 2014-08-05 ENCOUNTER — Telehealth (HOSPITAL_COMMUNITY): Payer: Self-pay | Admitting: *Deleted

## 2014-08-05 NOTE — Discharge Summary (Signed)
Physician Discharge Summary  Patient ID: [name] MRN: [id number] DOB/AGE: 01/01/1946 68 y.o.  Admit date: 07/24/2014 Discharge date: 08/05/2014  Discharge Diagnoses:  Principal Problem:   Debility Active Problems:   Chronic diastolic heart failure   ETOH abuse   Gout flare   Diabetic polyneuropathy associated with type 2 diabetes mellitus   Thrush, oral   Osteoarthritis of both hands   Intracerebral hemorrhage   Discharged Condition: Stable.   Significant Diagnostic Studies: Ct Head Wo Contrast  08/01/2014   CLINICAL DATA:  Dizziness and weakness, stroke.  EXAM: CT HEAD WITHOUT CONTRAST  TECHNIQUE: Contiguous axial images were obtained from the base of the skull through the vertex without intravenous contrast.  COMPARISON:  MRI of the head July 31, 2014  FINDINGS: LEFT inferior cerebellar hypodensity with superimposed punctate densities medially corresponding to susceptibility artifact. The ventricles and sulci are overall normal for patient's age. No midline shift or mass effect. Mild white matter changes suggest chronic small vessel ischemic disease; LEFT frontal mesial transcortical encephalomalacia is likely ischemic in etiology. RIGHT parietal convexity extra-axial presumed small subarachnoid cyst. Basal cisterns are patent. Severe calcific atherosclerosis of the carotid siphons.  No paranasal sinus air-fluid levels. Tiny RIGHT maxillary mucous retention cyst. Mastoid air cells are well-aerated. No skull fracture. Ocular globes and orbital contents are unremarkable.  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  Involutional changes. Mild white matter changes suggest chronic small vessel ischemic disease. LEFT mesial frontal lobe encephalomalacia is likely post ischemic.   Electronically Signed   By: Elon Alas   On: 08/01/2014 06:00    Mr Brain Wo Contrast  07/31/2014   CLINICAL DATA:  Dizziness,  recent frequent falls, generalized weakness. Alcohol abuse. Metastatic breast cancer. Diabetes mellitus.  EXAM: MRI HEAD WITHOUT CONTRAST  MRA HEAD WITHOUT CONTRAST  TECHNIQUE: Multiplanar, multiecho pulse sequences of the brain and surrounding structures were obtained without intravenous contrast. Angiographic images of the head were obtained using MRA technique without contrast.  COMPARISON:  CT head 07/21/2014.  FINDINGS: MRI HEAD FINDINGS  There is a moderate-sized area of acute infarction affecting the LEFT inferior cerebellum. Laterally there is largely confluent but scattered focal areas of restricted diffusion lying within the LEFT posterior inferior cerebellar artery territory. Medially, also involving the tonsil, there is susceptibility artifact as seen on gradient, T2, and diffusion sequence consistent with hemorrhagic transformation. This should be monitored carefully with CT. There is no shift of the fourth ventricle or significant deformity to imply impending hydrocephalus. No tonsillar herniation.  Generalized cerebral and cerebellar atrophy is advanced for the patient's age. There is extensive T2 and FLAIR hyperintensity representing chronic microvascular ischemic change. There is an old LEFT frontal infarct. Tiny foci of chronic hemorrhage can be seen elsewhere in the brain, likely sequelae of hypertension.  Flow voids are maintained in the carotid, basilar, and LEFT vertebral arteries. The RIGHT vertebral appears to end in PICA. The LEFT vertebral appears moderately diseased, described below.  No midline abnormality. No sinus disease. Negative orbits. No mastoid fluid. Negative osseous structures.  MRA HEAD FINDINGS  The internal carotid arteries demonstrate mild irregularity in their cavernous segments. BILATERAL ICA termini are widely patent.  Basilar artery is widely patent with LEFT vertebral a sole contributor.  There is diminished flow related enhancement in the LEFT vertebral particularly in  its distal V4 segment. There is considerable motion degradation, but it appears likely that there is focal plaque or localized dissection within the  LEFT vertebral. This is also suggested on the gradient sequence (images 24-28 series 9). Other than a short stump representing its origin (image 32 series 8), there is no appreciable flow related enhancement in the LEFT PICA.  No proximal stenosis of the middle cerebral arteries. Atretic or severely diseased A1 ACA on the RIGHT. Azygos LEFT ACA supplies both anterior cerebrals. 75% stenosis of the distal P1 segment RIGHT PCA. There is prominence at the origin of the LEFT posterior communicating artery. I suspect this represents an infundibulum.  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 Recommend continued surveillance with CT scanning to exclude expanding posterior fossa hematoma.  LEFT PICA thrombosis. Suspect adjacent LEFT vertebral dissection or focal plaque.  Generalized atrophy with small vessel disease.  Findings discussed with ordering provider.   Electronically Signed   By: Rolla Flatten M.D.   On: 07/31/2014 14:56    Labs:  Basic Metabolic Panel:  Recent Labs Lab 07/31/14 0700  NA 138  K 4.2  CL 94*  CO2 30  GLUCOSE 143*  BUN 54*  CREATININE 1.19  1.21  CALCIUM 9.4    CBC: CBC Latest Ref Rng 07/27/2014 07/25/2014 07/22/2014  WBC 4.0 - 10.5 K/uL 6.7 7.0 7.3  Hemoglobin 13.0 - 17.0 g/dL 12.2(L) 12.9(L) 11.9(L)  Hematocrit 39.0 - 52.0 % 37.5(L) 39.7 37.7(L)  Platelets 150 - 400 K/uL 137(L) 148(L) 145(L)     CBG:  Recent Labs Lab 08/01/14 1124 08/01/14 1645 08/01/14 2116 08/02/14 0712 08/02/14 1238  GLUCAP 126* 127* 183* 171* 114*    Brief HPI:   [name] is a 68 y.o. male with history of OSA, HTN, CAF, ongoing alcohol abuse, chronic pain; who was admitted on 07/21/14 with fatigue, increased weakness and BLE edema. He was treated with  IV lasix due to complaints of SOB and Dr. Haroldine Laws consulted for input. CT head with moderate generalized atrophy with progression since 2011. He felt that volume status was stable but patient with polyarticular gout flare in setting of ETOH abuse and added steroids for treatment. Uric acid 11.1 with TSH 5.74. X rays lumbar spine with extensive spondylosis and osteoarthritic change with diffuse idiopathic skeletal hyperostosis. Therapy evaluation done and patient with significant balance deficits with ataxia as well as difficulty with mobility due to gout flare. CIR was recommended by MD and therapy team.   Hospital Course: CAYSON KALB was admitted to rehab 07/24/2014 for inpatient therapies to consist of PT, ST and OT at least three hours five days a week. Past admission physiatrist, therapy team and rehab RN have worked together to provide customized collaborative inpatient rehab. Daily weights were monitored with I/O and CHF has been compensated. LE edema was treated with compression hose as well as elevation.  He was tapered off prednisone and started on colchicine as well as allopurinol to prevent recurrent flare. He was treated with diflucan due to oral thrush.  He continued to complain of dizziness and MRI/MRA of brain was done revealing left cerebellar infarct well as question of L-PICA thrombosis. Carotid dopplers attempted but limited due to poor patient cooperation. Neurology was consulted and recommended ASA 325 mg daily. Patient not anticoagulation candidate due to gait disorder as well as ongoing alcohol abuse.  He continues to have balance deficits requiring min assist for safety. He will continue to receive follow up [place], [place] and [place] by [facility]. Patient declined substance abuse resources.    Rehab course: During patient's stay  in rehab weekly team conferences were held to monitor patient's progress, set goals and discuss barriers to discharge. Patient has had improvement in  activity tolerance, balance, postural control, as well as ability to compensate for deficits.  He requires min assist for bathing and lowe body dressing tasks. He is able to complete upper body dressing with supervision. He requires moderate assist for toileting tasks.  He is ambulating at min assist for household distances and requires supervision for wheelchair use in community setting.   Disposition: 01-Home or Self Care  Diet: Heart Healthy   Special Instructions: 1. No alcohol. 2. Check daily weights.     Medication List    STOP taking these medications       aspirin 81 MG chewable tablet  Replaced by:  aspirin 325 MG EC tablet     BAYER CONTOUR TEST test strip  Generic drug:  glucose blood     LANTUS SOLOSTAR 100 UNIT/ML Solostar Pen  Generic drug:  Insulin Glargine  Replaced by:  insulin glargine 100 UNIT/ML injection     metolazone 5 MG tablet  Commonly known as:  ZAROXOLYN     MOISTURE BARRIER EX     Potassium Chloride ER 20 MEQ Tbcr  Replaced by:  potassium chloride SA 20 MEQ tablet     senna 8.6 MG Tabs tablet  Commonly known as:  SENOKOT      TAKE these medications       allopurinol 300 MG tablet  Commonly known as:  ZYLOPRIM  Take 1 tablet (300 mg total) by mouth daily.     aspirin 325 MG EC tablet  Take 1 tablet (325 mg total) by mouth daily.     atorvastatin 20 MG tablet  Commonly known as:  LIPITOR  Take 1 tablet (20 mg total) by mouth daily.     carvedilol 3.125 MG tablet  Commonly known as:  COREG  Take 1 tablet (3.125 mg total) by mouth 2 (two) times daily with a meal.     colchicine 0.6 MG tablet  Take 1 tablet (0.6 mg total) by mouth 2 (two) times daily.     diclofenac sodium 1 % Gel  Commonly known as:  VOLTAREN  Apply 2 g topically 4 (four) times daily.     fluconazole 100 MG tablet  Commonly known as:  DIFLUCAN  Take 1 tablet (100 mg total) by mouth daily.     FLUoxetine 20 MG capsule  Commonly known as:  PROZAC  Take 1  capsule (20 mg total) by mouth daily.     Fluticasone Furoate 200 MCG/ACT Aepb  Inhale 1 puff into the lungs 2 (two) times daily.     folic acid 1 MG tablet  Commonly known as:  FOLVITE  Take 1 tablet (1 mg total) by mouth daily.     furosemide 80 MG tablet  Commonly known as:  LASIX  Take 2 tablets (160 mg total) by mouth 2 (two) times daily.     gabapentin 300 MG capsule  Commonly known as:  NEURONTIN  Take 1 capsule (300 mg total) by mouth 2 (two) times daily.     insulin glargine 100 UNIT/ML injection  Commonly known as:  LANTUS  Inject 0.08 mLs (8 Units total) into the skin at bedtime.     lactulose 10 GM/15ML solution  Commonly known as:  CHRONULAC  Take 30 mLs (20 g total) by mouth 2 (two) times daily.     lactulose 10 GM/15ML solution  Commonly  known as:  CHRONULAC  Take 30 mLs (20 g total) by mouth 2 (two) times daily.     levothyroxine 125 MCG tablet  Commonly known as:  SYNTHROID, LEVOTHROID  Take 1.5 tablets (187 mcg total) by mouth daily before breakfast.     multivitamin with minerals Tabs tablet  Take 1 tablet by mouth daily.     omeprazole 20 MG capsule  Commonly known as:  PRILOSEC  Take 1 capsule (20 mg total) by mouth daily.     Oxycodone HCl 10 MG Tabs  Take 1 tablet (10 mg total) by mouth every 6 (six) hours as needed for severe pain.     potassium chloride SA 20 MEQ tablet  Commonly known as:  K-DUR,KLOR-CON  Take 1 tablet (20 mEq total) by mouth daily.       Follow-up Information   Call Charlett Blake, MD. (As needed)    Specialty:  Physical Medicine and Rehabilitation   Contact information:   [place] [address] [phone]       Follow up with Glori Bickers, MD.   Specialty:  Cardiology   Contact information:   [address] [place] [address] [phone]       Follow up with Jene Every, MD On 08/14/2014. (APPT @ 11;00 AM)    Specialty:  Family Medicine   Contact  information:   [address] [place] [address] [phone]       Signed: Bary Leriche 08/05/2014, 11:24 AM

## 2014-08-05 NOTE — Telephone Encounter (Signed)
Pt's daughter called stating pt is sch for right mastectomy on Mon 11/2 and is sch for pre-op tomorrow but wants to know if this ok or should be rescheduled, discussed w/Dr Bensimhon, he recommends since pt just had stroke and was hospitalized the surgery should be moved out a few weeks, [name] is aware and will call surgeons office

## 2014-08-06 ENCOUNTER — Other Ambulatory Visit (HOSPITAL_COMMUNITY): Payer: Medicare Other

## 2014-08-08 ENCOUNTER — Ambulatory Visit (HOSPITAL_COMMUNITY)
Admission: RE | Admit: 2014-08-08 | Discharge: 2014-08-08 | Disposition: A | Discharge status: Home / Self Care | Payer: Medicare Other | Source: Ambulatory Visit | Attending: Internal Medicine | Admitting: Internal Medicine

## 2014-08-08 ENCOUNTER — Encounter (HOSPITAL_COMMUNITY): Payer: Self-pay

## 2014-08-08 VITALS — BP 128/72 | HR 63 | Resp 20 | Wt 242.2 lb

## 2014-08-08 DIAGNOSIS — F1210 Cannabis abuse, uncomplicated: Secondary | ICD-10-CM | POA: Insufficient documentation

## 2014-08-08 DIAGNOSIS — Z794 Long term (current) use of insulin: Secondary | ICD-10-CM | POA: Insufficient documentation

## 2014-08-08 DIAGNOSIS — C50921 Malignant neoplasm of unspecified site of right male breast: Secondary | ICD-10-CM | POA: Insufficient documentation

## 2014-08-08 DIAGNOSIS — Z951 Presence of aortocoronary bypass graft: Secondary | ICD-10-CM | POA: Diagnosis not present

## 2014-08-08 DIAGNOSIS — Z87891 Personal history of nicotine dependence: Secondary | ICD-10-CM | POA: Diagnosis not present

## 2014-08-08 DIAGNOSIS — I482 Chronic atrial fibrillation: Secondary | ICD-10-CM | POA: Insufficient documentation

## 2014-08-08 DIAGNOSIS — I872 Venous insufficiency (chronic) (peripheral): Secondary | ICD-10-CM | POA: Insufficient documentation

## 2014-08-08 DIAGNOSIS — I480 Paroxysmal atrial fibrillation: Secondary | ICD-10-CM

## 2014-08-08 DIAGNOSIS — E039 Hypothyroidism, unspecified: Secondary | ICD-10-CM | POA: Diagnosis not present

## 2014-08-08 DIAGNOSIS — J449 Chronic obstructive pulmonary disease, unspecified: Secondary | ICD-10-CM | POA: Diagnosis not present

## 2014-08-08 DIAGNOSIS — E119 Type 2 diabetes mellitus without complications: Secondary | ICD-10-CM | POA: Insufficient documentation

## 2014-08-08 DIAGNOSIS — I6523 Occlusion and stenosis of bilateral carotid arteries: Secondary | ICD-10-CM | POA: Diagnosis not present

## 2014-08-08 DIAGNOSIS — F1010 Alcohol abuse, uncomplicated: Secondary | ICD-10-CM | POA: Insufficient documentation

## 2014-08-08 DIAGNOSIS — Z7982 Long term (current) use of aspirin: Secondary | ICD-10-CM | POA: Insufficient documentation

## 2014-08-08 DIAGNOSIS — I2510 Atherosclerotic heart disease of native coronary artery without angina pectoris: Secondary | ICD-10-CM | POA: Insufficient documentation

## 2014-08-08 DIAGNOSIS — I5032 Chronic diastolic (congestive) heart failure: Secondary | ICD-10-CM | POA: Insufficient documentation

## 2014-08-08 DIAGNOSIS — I10 Essential (primary) hypertension: Secondary | ICD-10-CM | POA: Diagnosis not present

## 2014-08-08 MED ORDER — METOLAZONE 5 MG PO TABS: 5.0000 mg | ORAL_TABLET | ORAL | Status: DC

## 2014-08-08 NOTE — Progress Notes (Addendum)
Patient ID: [name], male   DOB: 12-14-1945, 68 y.o.   MRN: [id number]  Dr. Noberto Retort - PCP at [facility] Surgeon: Dr Excell Seltzer Oncologist: Pending.    HPI: [name] is a 68 yo male with PMX s/f CAD (s/p CABG x 4 in 2001, normal Myoview 2010), chronic diastolic CHF, chronic a-fib, DM, HTN, HL, carotid artery disease, COPD, hypothyroidism, EtOH abuse, chronic venous insufficiency, CKD, and morbid obesity.  He has not been on anticoagulants due to ETOH abuse and fall risk.   Admitted to [facility] 7/5 through 04/27/13 with massive volume overload. Sluggish diuresis with intermittent lasix, and he was transitioned to Lasix drip and dopamine drip at 3 mcg/kg/hr. Diuresis was sluggish until diamox added with marked improvement. Later switched to torsemide 80 mg in am and 40 mg in pm.  Diuresed over 40 pounds. Discharge weight was 230 pounds.   He was admitted in 7/15 with acute on chronic diastolic CHF and responded poorly to IV Lasix.  He required hemodialysis transiently but was able to stop it.  He was sent home on Lasix 160 mg bid.   Admitted to [facility] 10/11 through 07/24/14 with volume overload.  Diuresed with IV lasix and transferred to CIR for ongoing rehab.  While there he had CVA. He was discharged home with Genitva. Discharge weight was 243 pounds.   He returns for follow up. Complains of fatigue. Plan for mastectomy November 13th. Dr Haroldine Laws. Unable to weigh due to balance problems. SOB with exertion and rest.  Appetite good.  He is drinking 2-3 shots of whiskey 2-3 days a week. Daughter lives with him.    ECHO 09/2011: LVEF 45-50%, mod biatral enlargement, moderate RV dilatation, moderate TR, PASP 66 mmHg.  ECHO 08/2012 EF 55-60% moderate bilateral RA/LA enlargement ECHO 7/15 EF 50-55%, moderately dilated RV.   05/2014- Pre-operative Lexiscan Cardiolite that showed EF 63%, no ischemia or infarction.   Labs:  04/30/13 Creatinine 1.46 Potassium 4.0 05/28/13 Creatinine 1.19  Potassium 4.3 07/17/13 Creatinine 1.2, K+ 4.7 1/15 creatinine 1.6, K 3.5, LFTs normal 8/15 creatinine 1.4, K 3.6 06/11/14 Creatinine 1.1 K 4.7   ROS: All systems negative except as listed in HPI, PMH and Problem List.  Past Medical History  Diagnosis Date  . CHRONIC OBSTRUCTIVE PULMONARY DISEASE 06/20/2009  . OBSTRUCTIVE SLEEP APNEA 06/20/2009  . CAROTID STENOSIS 06/20/2009    A. 08/2001 s/p L CEA;  B.   09/14/11 - Carotid U/S - 40-59% bilateral stenosis, left CEA patch angioplasty is patent  . DM 06/20/2009  . CAD 06/20/2009    A.  08/2000 - s/p CABG x 4 - LIMA-LAD, Left Radial-OM, VG-DIAG, VG-RCA;  B. Neg. MV  2010  . HYPERLIPIDEMIA 06/20/2009  . HYPERTENSION 06/20/2009  . Hypothyroidism   . Low back pain   . Asthma     as child  . Pneumonia   . Atrial fibrillation     Not felt to be coumadin candidate 2/2 ETOH use.  Marland Kitchen ETOH abuse   . History of tobacco abuse     remote - quit 1970  . Bilateral renal cysts   . Marijuana abuse   . Morbidly obese   . CHF (congestive heart failure)   . Falls frequently   . Hx of cardiovascular stress test 05/2014    Lexiscan Myoview (8/15):  No ischemia; EF 63% - Normal Study  . Breast cancer, right breast   . Gout     Current Outpatient Prescriptions  Medication Sig  Dispense Refill  . allopurinol (ZYLOPRIM) 300 MG tablet Take 1 tablet (300 mg total) by mouth daily.  30 tablet  3  . aspirin EC 325 MG EC tablet Take 1 tablet (325 mg total) by mouth daily.  30 tablet  0  . atorvastatin (LIPITOR) 20 MG tablet Take 1 tablet (20 mg total) by mouth daily.  30 tablet  1  . carvedilol (COREG) 3.125 MG tablet Take 1 tablet (3.125 mg total) by mouth 2 (two) times daily with a meal.  60 tablet  5  . colchicine 0.6 MG tablet Take 1 tablet (0.6 mg total) by mouth 2 (two) times daily.  30 tablet  0  . diclofenac sodium (VOLTAREN) 1 % GEL Apply 2 g topically 4 (four) times daily.  1 Tube  1  . fluconazole (DIFLUCAN) 100 MG tablet Take 1 tablet (100 mg total) by  mouth daily.  7 tablet  0  . FLUoxetine (PROZAC) 20 MG capsule Take 1 capsule (20 mg total) by mouth daily.  30 capsule  3  . Fluticasone Furoate 200 MCG/ACT AEPB Inhale 1 puff into the lungs 2 (two) times daily.  30 each  1  . folic acid (FOLVITE) 1 MG tablet Take 1 tablet (1 mg total) by mouth daily.  30 tablet  1  . furosemide (LASIX) 80 MG tablet Take 2 tablets (160 mg total) by mouth 2 (two) times daily.  120 tablet  5  . gabapentin (NEURONTIN) 300 MG capsule Take 1 capsule (300 mg total) by mouth 2 (two) times daily.  60 capsule  1  . insulin glargine (LANTUS) 100 UNIT/ML injection Inject 0.08 mLs (8 Units total) into the skin at bedtime.  10 mL  11  . lactulose (CHRONULAC) 10 GM/15ML solution Take 30 mLs (20 g total) by mouth 2 (two) times daily.  240 mL  0  . lactulose (CHRONULAC) 10 GM/15ML solution Take 30 mLs (20 g total) by mouth 2 (two) times daily.  240 mL  0  . levothyroxine (SYNTHROID, LEVOTHROID) 125 MCG tablet Take 1.5 tablets (187 mcg total) by mouth daily before breakfast.  30 tablet  3  . Multiple Vitamin (MULTIVITAMIN WITH MINERALS) TABS tablet Take 1 tablet by mouth daily.      Marland Kitchen omeprazole (PRILOSEC) 20 MG capsule Take 1 capsule (20 mg total) by mouth daily.  30 capsule  1  . potassium chloride SA (K-DUR,KLOR-CON) 20 MEQ tablet Take 1 tablet (20 mEq total) by mouth daily.  30 tablet  1  . oxyCODONE 10 MG TABS Take 1 tablet (10 mg total) by mouth every 6 (six) hours as needed for severe pain.  60 tablet  0   No current facility-administered medications for this encounter.   Filed Vitals:   08/08/14 1421  BP: 128/72  Pulse: 63  Resp: 20  Weight: 242 lb 4 oz (109.884 kg)  SpO2: 89%    PHYSICAL EXAM: General:  Chronically ill appearing. No resp difficulty. Arrived in clinic in a wheelchair. Daughter present HEENT: normal Neck: supple. JVP appears 10-11 cm  Carotids 2+ bilaterally; no bruits. No lymphadenopathy or thryomegaly appreciated. Cor: PMI normal. Irregular  rate & irregular rhythm. No rubs, gallops. 1/6 SEM RUSB  Lungs: CTA Abdomen: obese, soft, nontender, mildly distended. No hepatosplenomegaly. No bruits or masses. Good bowel sounds. Extremities: no cyanosis, clubbing, rash, trace edema  (L>R, chronic) R and LLE ted hose.  Neuro: alert & orientedx3, cranial nerves grossly intact. Moves all 4 extremities w/o difficulty. Affect  pleasant.  ASSESSMENT & PLAN:  1) Chronic diastolic HF: EF 90-30% with moderately dilated RV on 7/15 echo.  NYHA III symptoms. Volume status stable.   - Continue Lasix 160 mg bid and restart metolazone 5 mg every Monday 30 minutes prior to Lasix in am. Continue potassium 20 meq daily.  Check BMET next week 2) A-Fib: Stable, rate controlled on Coreg. He continues to drink alcohol.  Will hold off on anticoagulation due to ongoing alcohol intake.   3) HTN: BP controlled.  4) CAD: No ischemic symptoms.  Normal Cardiolite in 8/15. Continue ASA and statin.   5) Carotid stenosis: 60-79% bilateral ICA stenosis,  Most recent doppler 11/14. Repeat carotid u/s.  6) CKD: Reviewed BMET from 07/31/14.  Renal function stable.   7) Breast cancer: Lexiscan Cardiolite was normal.  Given his h/o CAD and diastolic CHF he is still at moderate, but acceptable, risk for peri-op CV complications. Can proceed without further cardiac testing in 2 weeks.    Follow up 3 months   Dimond Crotty NP-C  08/08/2014

## 2014-08-08 NOTE — Patient Instructions (Signed)
Follow up 3 months   Take metolazone 5 mg every Friday with an extra 20 meq of potassium   Do the following things EVERYDAY: 1) Weigh yourself in the morning before breakfast. Write it down and keep it in a log. 2) Take your medicines as prescribed 3) Eat low salt foods-Limit salt (sodium) to 2000 mg per day.  4) Stay as active as you can everyday 5) Limit all fluids for the day to less than 2 liters

## 2014-08-08 NOTE — Consult Note (Signed)
NEUROCOGNITIVE [name]   [name] is a 68 year old man, who was seen for a brief neurocognitive status examination to evaluate his emotional state and mental status in the setting of gout flare.  According to his medical record, he was admitted on 07/21/14 with fatigue, increased weakness and bilateral lower extremity edema as well as complaints of shortness of breath.  A CT of his head revealed moderate generalized atrophy with progression since 2011.   Volume was felt to be stable but with polyarticular gout flare in the setting of alcohol abuse and steroids were added for treatment.  He was observed to have significant balance deficits with ataxia as well as mobility issues due to gout flare and inpatient rehabilitation was recommended.    Since admission to the rehabilitation unit, he complained of trouble balancing and drifting to the left when walking.  Neurology consult was requested and MRI of the brain was completed on 07/31/14.  The MRI revealed a moderate sized infarction affecting the left inferior cerebellum, with hemorrhagic transformation medially.  Additionally, generalized atrophy and small vessel disease were noted.  The neurologist recommended follow-up CT of the head, which was conducted on 08/01/14.   It revealed evolving left posterior inferior cerebellar artery infarct with petechial hemorrhage as well as chronic small vessel ischemic disease and left mesial frontal lobe encephalomalacia.    Emotional Functioning:  During the clinical interview, Mr. [name] described himself as doing "pretty good," though he commented that his knees were bothering him and he continues to experience dizziness.  He reported improvement in pain level.  Mr. [name] acknowledged worry over having had a stroke as he fears he will end up "helpless."  Significant time was spent during this session addressing Mr. [name] history of alcohol  abuse.  He mentioned that all of his adult life, he consumed up to a pint of liquor daily.  However, he also stated that he is "not an alcoholic" and cited the fact that he has not experienced withdrawal symptoms during this current hospitalization as proof that he does not have alcoholism.  Still, he said that he plans to give up drinking alcohol.  He did wonder with the neuropsychologist whether he could drink "a couple ounces of liquor before dinner" if he was on vacation and he was advised that ideally, he should completely discontinue use of alcohol.  He was agreeable to having his daughter remove all alcohol from his home prior to his discharge.  Mr. [name] denied major symptoms of mood disruption, including suicidal ideation.  His responses to self-report measures of mood symptoms were not suggestive of the presence of clinically significant depression or anxiety at this time.    Mental Status:  Mr. [name] total score on a very brief overall measure of mental status was not suggestive of the presence of significant cognitive disruption at this time (MMSE-2 brief = 13/16).  He lost points for misstating the county in which he was being seen and for inability to freely recall 2 of 3 words after a brief delay.  Subjectively, he stated that his memory and other cognitive abilities were "never good," though he admitted that his memory and concentration seem worse lately.    Impressions and Recommendations:  Mr. [name] neurocognitive screening did not yield a score that was suggestive of marked cognitive impairment.  However, if he develops new or worsening cognitive concerns post-discharge, a comprehensive neuropsychological evaluation would be warranted.  From an emotional standpoint,  he did not endorse symptoms consistent with clinically significant mood disruption that would require medication adjustments or other changes in treatment plan.  Most of the session was spent in exploring his history of alcohol  use and in anticipating barriers to maintaining sobriety.  He seemed optimistic about his chances of abstaining from alcohol use post-discharge.    DIAGNOSIS: Gout flare  Marlane [name], Psy.D.  Clinical Neuropsychologist

## 2014-08-12 ENCOUNTER — Encounter (HOSPITAL_COMMUNITY): Payer: Medicare Other

## 2014-08-20 ENCOUNTER — Encounter (HOSPITAL_COMMUNITY): Payer: Self-pay

## 2014-08-20 ENCOUNTER — Emergency Department (HOSPITAL_COMMUNITY)
Admission: EM | Admit: 2014-08-20 | Discharge: 2014-08-20 | Disposition: A | Discharge status: Home / Self Care | Payer: Medicare Other | Attending: Emergency Medicine | Admitting: Emergency Medicine

## 2014-08-20 DIAGNOSIS — E119 Type 2 diabetes mellitus without complications: Secondary | ICD-10-CM | POA: Insufficient documentation

## 2014-08-20 DIAGNOSIS — Q6102 Congenital multiple renal cysts: Secondary | ICD-10-CM | POA: Insufficient documentation

## 2014-08-20 DIAGNOSIS — I509 Heart failure, unspecified: Secondary | ICD-10-CM | POA: Insufficient documentation

## 2014-08-20 DIAGNOSIS — Z9889 Other specified postprocedural states: Secondary | ICD-10-CM | POA: Diagnosis not present

## 2014-08-20 DIAGNOSIS — L03316 Cellulitis of umbilicus: Secondary | ICD-10-CM | POA: Diagnosis not present

## 2014-08-20 DIAGNOSIS — E039 Hypothyroidism, unspecified: Secondary | ICD-10-CM | POA: Insufficient documentation

## 2014-08-20 DIAGNOSIS — L0390 Cellulitis, unspecified: Secondary | ICD-10-CM

## 2014-08-20 DIAGNOSIS — Z79899 Other long term (current) drug therapy: Secondary | ICD-10-CM | POA: Insufficient documentation

## 2014-08-20 DIAGNOSIS — Z9181 History of falling: Secondary | ICD-10-CM | POA: Diagnosis not present

## 2014-08-20 DIAGNOSIS — I10 Essential (primary) hypertension: Secondary | ICD-10-CM | POA: Diagnosis not present

## 2014-08-20 DIAGNOSIS — L02216 Cutaneous abscess of umbilicus: Secondary | ICD-10-CM | POA: Insufficient documentation

## 2014-08-20 DIAGNOSIS — Z7951 Long term (current) use of inhaled steroids: Secondary | ICD-10-CM | POA: Insufficient documentation

## 2014-08-20 DIAGNOSIS — Z87891 Personal history of nicotine dependence: Secondary | ICD-10-CM | POA: Diagnosis not present

## 2014-08-20 DIAGNOSIS — Z8701 Personal history of pneumonia (recurrent): Secondary | ICD-10-CM | POA: Diagnosis not present

## 2014-08-20 DIAGNOSIS — Z791 Long term (current) use of non-steroidal anti-inflammatories (NSAID): Secondary | ICD-10-CM | POA: Diagnosis not present

## 2014-08-20 DIAGNOSIS — Z7982 Long term (current) use of aspirin: Secondary | ICD-10-CM | POA: Diagnosis not present

## 2014-08-20 DIAGNOSIS — Z853 Personal history of malignant neoplasm of breast: Secondary | ICD-10-CM | POA: Insufficient documentation

## 2014-08-20 DIAGNOSIS — I2510 Atherosclerotic heart disease of native coronary artery without angina pectoris: Secondary | ICD-10-CM | POA: Diagnosis not present

## 2014-08-20 DIAGNOSIS — J441 Chronic obstructive pulmonary disease with (acute) exacerbation: Secondary | ICD-10-CM | POA: Diagnosis not present

## 2014-08-20 DIAGNOSIS — E785 Hyperlipidemia, unspecified: Secondary | ICD-10-CM | POA: Diagnosis not present

## 2014-08-20 DIAGNOSIS — Z794 Long term (current) use of insulin: Secondary | ICD-10-CM | POA: Insufficient documentation

## 2014-08-20 DIAGNOSIS — I4891 Unspecified atrial fibrillation: Secondary | ICD-10-CM | POA: Insufficient documentation

## 2014-08-20 DIAGNOSIS — Z951 Presence of aortocoronary bypass graft: Secondary | ICD-10-CM | POA: Insufficient documentation

## 2014-08-20 DIAGNOSIS — Z8669 Personal history of other diseases of the nervous system and sense organs: Secondary | ICD-10-CM | POA: Insufficient documentation

## 2014-08-20 DIAGNOSIS — M109 Gout, unspecified: Secondary | ICD-10-CM | POA: Diagnosis not present

## 2014-08-20 DIAGNOSIS — L0291 Cutaneous abscess, unspecified: Secondary | ICD-10-CM

## 2014-08-20 LAB — CBC WITH DIFFERENTIAL/PLATELET
Basophils Absolute: 0 10*3/uL (ref 0.0–0.1)
Basophils Relative: 0 % (ref 0–1)
EOS ABS: 0.3 10*3/uL (ref 0.0–0.7)
Eosinophils Relative: 5 % (ref 0–5)
HCT: 36.5 % — ABNORMAL LOW (ref 39.0–52.0)
HEMOGLOBIN: 11.9 g/dL — AB (ref 13.0–17.0)
Lymphocytes Relative: 17 % (ref 12–46)
Lymphs Abs: 1.1 10*3/uL (ref 0.7–4.0)
MCH: 30.1 pg (ref 26.0–34.0)
MCHC: 32.6 g/dL (ref 30.0–36.0)
MCV: 92.4 fL (ref 78.0–100.0)
MONOS PCT: 15 % — AB (ref 3–12)
Monocytes Absolute: 1 10*3/uL (ref 0.1–1.0)
NEUTROS ABS: 4.1 10*3/uL (ref 1.7–7.7)
NEUTROS PCT: 63 % (ref 43–77)
PLATELETS: 140 10*3/uL — AB (ref 150–400)
RBC: 3.95 MIL/uL — AB (ref 4.22–5.81)
RDW: 16.6 % — ABNORMAL HIGH (ref 11.5–15.5)
WBC: 6.6 10*3/uL (ref 4.0–10.5)

## 2014-08-20 LAB — BASIC METABOLIC PANEL
ANION GAP: 15 (ref 5–15)
BUN: 78 mg/dL — ABNORMAL HIGH (ref 6–23)
CO2: 32 meq/L (ref 19–32)
CREATININE: 1.61 mg/dL — AB (ref 0.50–1.35)
Calcium: 8.9 mg/dL (ref 8.4–10.5)
Chloride: 89 mEq/L — ABNORMAL LOW (ref 96–112)
GFR calc non Af Amer: 42 mL/min — ABNORMAL LOW (ref >90)
GFR, EST AFRICAN AMERICAN: 48 mL/min — AB (ref >90)
Glucose, Bld: 199 mg/dL — ABNORMAL HIGH (ref 70–99)
Potassium: 3.1 mEq/L — ABNORMAL LOW (ref 3.7–5.3)
SODIUM: 136 meq/L — AB (ref 137–147)

## 2014-08-20 LAB — PROTIME-INR
INR: 1.11 (ref 0.00–1.49)
PROTHROMBIN TIME: 14.5 s (ref 11.6–15.2)

## 2014-08-20 MED ORDER — AMOXICILLIN-POT CLAVULANATE 875-125 MG PO TABS: 1.0000 | ORAL_TABLET | Freq: Two times a day (BID) | ORAL | Status: DC

## 2014-08-20 MED ORDER — AMOXICILLIN-POT CLAVULANATE 875-125 MG PO TABS
1.0000 | ORAL_TABLET | Freq: Once | ORAL | Status: AC
  Administered 2014-08-20: 1 via ORAL
  Filled 2014-08-20: qty 1

## 2014-08-20 NOTE — ED Notes (Signed)
Per EMS: pt from home with bleeding from a recent surgical wound.  Pt sts up until about 5 days he did not have any complications.  Pt had small intestine removal 4 months; sts he also had a mini stroke last month.  Pt alert and oriented x 4 on arrival.  Small amount of bloody drainage on gauze.

## 2014-08-20 NOTE — ED Notes (Signed)
Dr Otter at bedside  

## 2014-08-20 NOTE — Discharge Instructions (Signed)
Clean your belly button with peroxide and a Q-tip twice a day.  Keep the area covered in between to help soak up any drainage.  Watch for signs of worsening redness past the lines drawn today in the emergency department.  If you have increasing redness fever or pain return to the emergency department right away.  Contact the surgery office in the morning to have a follow-up appointment this week to recheck your umbilical wound.   Abscess Care After An abscess (also called a boil or furuncle) is an infected area that contains a collection of pus. Signs and symptoms of an abscess include pain, tenderness, redness, or hardness, or you may feel a moveable soft area under your skin. An abscess can occur anywhere in the body. The infection may spread to surrounding tissues causing cellulitis. The pus appears to be draining well on its own.    The boil may be painful for 5 to 7 days. Most people with a boil do not have high fevers. HOME CARE INSTRUCTIONS   Only take over-the-counter or prescription medicines for pain, discomfort, or fever as directed by your caregiver.  When you bathe, soak and then remove gauze or iodoform packs at least daily or as directed by your caregiver. You may then wash the wound gently with mild soapy water. Repack with gauze or do as your caregiver directs. SEEK IMMEDIATE MEDICAL CARE IF:   You develop increased pain, swelling, redness, drainage, or bleeding in the wound site.  You develop signs of generalized infection including muscle aches, chills, fever, or a general ill feeling.  An oral temperature above 102 F (38.9 C) develops, not controlled by medication. See your caregiver for a recheck if you develop any of the symptoms described above. If medications (antibiotics) were prescribed, take them as directed. Document Released: 04/15/2005 Document Revised: 12/20/2011 Document Reviewed: 12/11/2007 [facility] Patient Information 2015 [place], [place]. This information is  not intended to replace advice given to you by your health care provider. Make sure you discuss any questions you have with your health care provider.  Cellulitis Cellulitis is an infection of the skin and the tissue beneath it. The infected area is usually red and tender. Cellulitis occurs most often in the arms and lower legs.  CAUSES  Cellulitis is caused by bacteria that enter the skin through cracks or cuts in the skin. The most common types of bacteria that cause cellulitis are staphylococci and streptococci. SIGNS AND SYMPTOMS   Redness and warmth.  Swelling.  Tenderness or pain.  Fever. DIAGNOSIS  Your health care provider can usually determine what is wrong based on a physical exam. Blood tests may also be done. TREATMENT  Treatment usually involves taking an antibiotic medicine. HOME CARE INSTRUCTIONS   Take your antibiotic medicine as directed by your health care provider. Finish the antibiotic even if you start to feel better.  Keep the infected arm or leg elevated to reduce swelling.  Apply a warm cloth to the affected area up to 4 times per day to relieve pain.  Take medicines only as directed by your health care provider.  Keep all follow-up visits as directed by your health care provider. SEEK MEDICAL CARE IF:   You notice red streaks coming from the infected area.  Your red area gets larger or turns dark in color.  Your bone or joint underneath the infected area becomes painful after the skin has healed.  Your infection returns in the same area or another area.  You  notice a swollen bump in the infected area.  You develop new symptoms.  You have a fever. SEEK IMMEDIATE MEDICAL CARE IF:   You feel very sleepy.  You develop vomiting or diarrhea.  You have a general ill feeling (malaise) with muscle aches and pains. MAKE SURE YOU:   Understand these instructions.  Will watch your condition.  Will get help right away if you are not doing well or  get worse. Document Released: 07/07/2005 Document Revised: 02/11/2014 Document Reviewed: 12/13/2011 [facility] Patient Information 2015 [place], [place]. This information is not intended to replace advice given to you by your health care provider. Make sure you discuss any questions you have with your health care provider.

## 2014-08-20 NOTE — ED Notes (Signed)
Water given to drink per MD

## 2014-08-20 NOTE — ED Notes (Signed)
Pt given graham crackers and peanut butter with chocolate milk

## 2014-08-20 NOTE — ED Provider Notes (Signed)
CSN: [id number]     Arrival date & time 08/20/14  0055 History   First MD Initiated Contact with Patient 08/20/14 0256     Chief Complaint  Patient presents with  . Bleeding/Bruising     (Consider location/radiation/quality/duration/timing/severity/associated sxs/prior Treatment) HPI Patient and daughter present to the emergency department from home via EMS with complaint of bloody drainage from bellybutton.  Patient is status post surgical repair of incarcerated umbilical hernia with small bowel obstruction and resection in April.  He has been doing well from his surgery.  Over the weekend, they noted that the area was warm and swollen in his bellybutton.  He had a slight amount of drainage on Sunday, worsening on Monday.  He has had no fevers, vomiting nausea or change in bowel habits.  They have noticed that the area around his bellybutton across his abdomen is red and warm.  He denies any pain.  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  Patient has history of COPD, CHF A. Fib alcohol abuse.  He denies any chest pain or shortness of breath.  He has follow-up with his primary care doctor tomorrow. Past Medical History  Diagnosis Date  . CHRONIC OBSTRUCTIVE PULMONARY DISEASE 06/20/2009  . OBSTRUCTIVE SLEEP APNEA 06/20/2009  . CAROTID STENOSIS 06/20/2009    A. 08/2001 s/p L CEA;  B.   09/14/11 - Carotid U/S - 40-59% bilateral stenosis, left CEA patch angioplasty is patent  . DM 06/20/2009  . CAD 06/20/2009    A.  08/2000 - s/p CABG x 4 - LIMA-LAD, Left Radial-OM, VG-DIAG, VG-RCA;  B. Neg. MV  2010  . HYPERLIPIDEMIA 06/20/2009  . HYPERTENSION 06/20/2009  . Hypothyroidism   . Low back pain   . Asthma     as child  . Pneumonia   . Atrial fibrillation     Not felt to be coumadin candidate 2/2 ETOH use.  Marland Kitchen ETOH abuse   . History of tobacco abuse     remote - quit 1970  . Bilateral renal cysts   . Marijuana abuse   . Morbidly obese    . CHF (congestive heart failure)   . Falls frequently   . Hx of cardiovascular stress test 05/2014    Lexiscan Myoview (8/15):  No ischemia; EF 63% - Normal Study  . Breast cancer, right breast   . Gout    Past Surgical History  Procedure Laterality Date  . Carotid endarterectomy  2002    left  . Coronary artery bypass graft      x 4 - 2001  . Umbilical hernia repair N/A 01/28/2014    Procedure: HERNIA REPAIR UMBILICAL ADULT/INC;  Surgeon: Harl Bowie, MD;  Location: [facility];  Service: General;  Laterality: N/A;  . Laparotomy N/A 01/28/2014    Procedure: EXPLORATORY LAPAROTOMY;  Surgeon: Harl Bowie, MD;  Location: [facility];  Service: General;  Laterality: N/A;  . Bowel resection N/A 01/28/2014    Procedure: SMALL BOWEL RESECTION;  Surgeon: Harl Bowie, MD;  Location: [facility];  Service: General;  Laterality: N/A;  . Hernia repair     Family History  Problem Relation Age of Onset  . Stroke Mother     ?  Marland Kitchen Cancer Mother     unknown type of cancer  . Stroke Father     ?  Marland Kitchen Heart attack Neg Hx   . Cancer Brother 72    stomach  cancer   History  Substance Use Topics  . Smoking status: Former Smoker    Quit date: 10/11/1968  . Smokeless tobacco: Never Used  . Alcohol Use: Yes     Comment: wild [place] daily;  also reports regularly drinking a pint of vodka.    Review of Systems  See History of Present Illness; otherwise all other systems are reviewed and negative   Allergies  Erythromycin  Home Medications   Prior to Admission medications   Medication Sig Start Date End Date Taking? Authorizing Provider  allopurinol (ZYLOPRIM) 300 MG tablet Take 1 tablet (300 mg total) by mouth daily. 08/02/14  Yes Daniel J Angiulli, PA-C  aspirin EC 325 MG EC tablet Take 1 tablet (325 mg total) by mouth daily. 08/02/14  Yes Daniel J Angiulli, PA-C  atorvastatin (LIPITOR) 20 MG tablet Take 1 tablet (20 mg total) by mouth daily. 08/02/14  Yes Daniel J Angiulli, PA-C   carvedilol (COREG) 3.125 MG tablet Take 1 tablet (3.125 mg total) by mouth 2 (two) times daily with a meal. 08/02/14  Yes Daniel J Angiulli, PA-C  colchicine 0.6 MG tablet Take 1 tablet (0.6 mg total) by mouth 2 (two) times daily. 08/02/14  Yes Daniel J Angiulli, PA-C  diclofenac sodium (VOLTAREN) 1 % GEL Apply 2 g topically 4 (four) times daily. 08/02/14  Yes Daniel J Angiulli, PA-C  FLUoxetine (PROZAC) 20 MG capsule Take 1 capsule (20 mg total) by mouth daily. 08/02/14  Yes Daniel J Angiulli, PA-C  Fluticasone Furoate 200 MCG/ACT AEPB Inhale 1 puff into the lungs 2 (two) times daily. 08/02/14  Yes Daniel J Angiulli, PA-C  folic acid (FOLVITE) 1 MG tablet Take 1 tablet (1 mg total) by mouth daily. 08/02/14  Yes Daniel J Angiulli, PA-C  furosemide (LASIX) 80 MG tablet Take 2 tablets (160 mg total) by mouth 2 (two) times daily. 08/02/14  Yes Daniel J Angiulli, PA-C  gabapentin (NEURONTIN) 300 MG capsule Take 1 capsule (300 mg total) by mouth 2 (two) times daily. 08/02/14  Yes Daniel J Angiulli, PA-C  insulin glargine (LANTUS) 100 UNIT/ML injection Inject 0.08 mLs (8 Units total) into the skin at bedtime. Patient taking differently: Inject 12 Units into the skin at bedtime.  08/02/14  Yes Daniel J Angiulli, PA-C  lactulose (CHRONULAC) 10 GM/15ML solution Take 30 mLs (20 g total) by mouth 2 (two) times daily. 08/02/14  Yes Daniel J Angiulli, PA-C  levothyroxine (SYNTHROID, LEVOTHROID) 125 MCG tablet Take 1.5 tablets (187 mcg total) by mouth daily before breakfast. 08/02/14  Yes Daniel J Angiulli, PA-C  metolazone (ZAROXOLYN) 5 MG tablet Take 1 tablet (5 mg total) by mouth once a week. Take on Friday  and as needed. Take with an extra 20 meq of potassium Patient taking differently: Take 5 mg by mouth once a week. On Friday 08/08/14  Yes Amy D [facility], NP  Multiple Vitamin (MULTIVITAMIN WITH MINERALS) TABS tablet Take 1 tablet by mouth daily.   Yes Historical Provider, MD  omeprazole (PRILOSEC) 20 MG  capsule Take 1 capsule (20 mg total) by mouth daily. 08/02/14  Yes Daniel J Angiulli, PA-C  oxyCODONE 10 MG TABS Take 1 tablet (10 mg total) by mouth every 6 (six) hours as needed for severe pain. 08/02/14  Yes Daniel J Angiulli, PA-C  potassium chloride SA (K-DUR,KLOR-CON) 20 MEQ tablet Take 1 tablet (20 mEq total) by mouth daily. Patient taking differently: Take 20-40 mEq by mouth daily. Take 2 tablets on Friday then take 1 tablet all  other days 08/02/14  Yes Daniel J Angiulli, PA-C  amoxicillin-clavulanate (AUGMENTIN) 875-125 MG per tablet Take 1 tablet by mouth 2 (two) times daily. 08/20/14   Kalman Drape, MD  fluconazole (DIFLUCAN) 100 MG tablet Take 1 tablet (100 mg total) by mouth daily. 08/02/14   Lavon Paganini Angiulli, PA-C  lactulose (CHRONULAC) 10 GM/15ML solution Take 30 mLs (20 g total) by mouth 2 (two) times daily. 08/02/14   Daniel J Angiulli, PA-C   BP 122/63 mmHg  Pulse 66  Temp(Src) 97.8 F (36.6 C) (Oral)  Resp 18  SpO2 99% Physical Exam  Constitutional: He is oriented to person, place, and time. He appears well-developed and well-nourished.  Morbidly obese male, no acute distress  HENT:  Head: Normocephalic and atraumatic.  Nose: Nose normal.  Mouth/Throat: Oropharynx is clear and moist.  Eyes: Conjunctivae and EOM are normal. Pupils are equal, round, and reactive to light.  Neck: Normal range of motion. Neck supple. No JVD present. No tracheal deviation present. No thyromegaly present.  Cardiovascular: Normal rate, regular rhythm, normal heart sounds and intact distal pulses.  Exam reveals no gallop and no friction rub.   No murmur heard. Pulmonary/Chest: Effort normal. No stridor. No respiratory distress. He has wheezes (mild wheezing). He has no rales. He exhibits no tenderness.  Abdominal: Soft. Bowel sounds are normal. He exhibits no distension and no mass. There is no tenderness. There is no rebound and no guarding.  Patient has purulent drainage from his umbilicus  with a small defect less than 0.5 cm.  He has surrounding erythema.  Area is not tender.  There is a small amount of induration.  Musculoskeletal: Normal range of motion. He exhibits no edema or tenderness.  Lymphadenopathy:    He has no cervical adenopathy.  Neurological: He is alert and oriented to person, place, and time. He displays normal reflexes. He exhibits normal muscle tone. Coordination normal.  Skin: Skin is warm and dry. No rash noted. No erythema. No pallor.  Psychiatric: He has a normal mood and affect. His behavior is normal. Judgment and thought content normal.  Nursing note and vitals reviewed.   ED Course  Procedures (including critical care time) Labs Review Labs Reviewed  CBC WITH DIFFERENTIAL - Abnormal; Notable for the following:    RBC 3.95 (*)    Hemoglobin 11.9 (*)    HCT 36.5 (*)    RDW 16.6 (*)    Platelets 140 (*)    Monocytes Relative 15 (*)    All other components within normal limits  BASIC METABOLIC PANEL - Abnormal; Notable for the following:    Sodium 136 (*)    Potassium 3.1 (*)    Chloride 89 (*)    Glucose, Bld 199 (*)    BUN 78 (*)    Creatinine, Ser 1.61 (*)    GFR calc non Af Amer 42 (*)    GFR calc Af Amer 48 (*)    All other components within normal limits  CULTURE, ROUTINE-ABSCESS  PROTIME-INR    Imaging Review No results found.   EKG Interpretation None      MDM   Final diagnoses:  Abscess, umbilical  Cellulitis and abscess    68 year old male with umbilical abscess that has spontaneously ruptured and is freely draining.  He has some associated cellulitis.  Case discussed with Dr. Georgette Dover, who feels that since patient does not have retained mesh from his surgery, not febrile now ill-appearing local wound care with peroxide and Augmentin coverage  will suffice.  Dr. Excell Seltzer to be contacted through [facility] regarding his surgery on Friday.  Patient has close follow-up with his primary care today who will be able to inspect the  area again.  Area of cellulitis outlined with skin marker.    Kalman Drape, MD 08/20/14 [phone]

## 2014-08-21 ENCOUNTER — Encounter (HOSPITAL_COMMUNITY): Payer: Medicare Other

## 2014-08-22 ENCOUNTER — Encounter (HOSPITAL_COMMUNITY): Payer: Self-pay | Admitting: *Deleted

## 2014-08-22 ENCOUNTER — Encounter (HOSPITAL_COMMUNITY): Payer: Self-pay | Admitting: Vascular Surgery

## 2014-08-22 MED ORDER — CEFAZOLIN SODIUM-DEXTROSE 2-3 GM-% IV SOLR: 2.0000 g | INTRAVENOUS | Status: AC

## 2014-08-22 NOTE — Progress Notes (Signed)
Spoke with pt's daughter for pre-op call. She states pt is thinking about cancelling his surgery, she is trying to convince him to have the surgery. She states that he says he doesn't feel good, had her cancel an appt for today. She states he doesn't have a fever or complaining of anything except "he doesn't feel good". We went ahead and did the pre-op call. She verified medical/surgical hx, meds and allergies. Gave her pre-op instructions. Also instructed her to call Dr. Lear Ng office if pt does definitely decide to cancel the surgery. Pt is scheduled as an ambulatory surgery and daughter asked if he could be kept at least overnight. I instructed her to call Dr. Lear Ng office and ask them. She voiced understanding.

## 2014-08-22 NOTE — Progress Notes (Signed)
Anesthesia Chart Review:  Patient is a 68 year old male scheduled for right total mastectomy with axillary SN biopsy tomorrow by Dr. Excell Seltzer.  He is scheduled to be a same day work-up per Dr. Lear Ng office.  Cardiology clearance was obtained back on 06/02/14 and again on 08/08/14 after hospital follow-up.  He is felt to be "at least moderate risk." Close attention will need to be paid to his volume status given significant hx of diastolic CHF.  History includes right breast cancer, former smoker, COPD, DM2 with peripheral neuropathy, CAD s/p CABG (LIMA to LAD, left RA to OM, SVG to DIAG, SVG to RCA) '01, afib, CHF, ETOH abuse (sober since 07/2014), [place] use (none in ~ one year), PVD, left CEA '02, hypothyroidism, depression, OSA without CPAP use, CKD stage III, GERD, frequent falls, small bowel resection for strangulated umbilical hernia 8/58/85. He was admitted to [facility] from 07/21/14 - 08/05/14 with increased BLE weakness, fatigue, SOB.  Cardiology was consulted and did not feel there was any evidence of acute decompensation of his CHF. 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 Neurology was consulted.  Dr. 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 BMI is consistent with obesity.  PCP is listed as Dr. Jene Every. Cardiologist is Dr. Angelena Form.    Nuclear stress test on 05/30/14: Overall Impression: Normal stress nuclear study. LV Ejection Fraction: 63%. LV Wall Motion: NL LV Function; NL Wall Motion.  Echo on 04/21/14:  - Left ventricle: The cavity size was normal. Wall thickness was normal. Systolic function was normal. The  estimated ejectionfraction was in the range of 50% to 55%. Doppler parameters areconsistent with both elevated ventricular end-diastolic fillingpressure and elevated left atrial filling pressure. - Mitral valve: Calcified annulus. Mildly thickened leaflets . - Left atrium: Moderately to severely dilated. - Right ventricle: The cavity size was moderately dilated. - Right atrium: The atrium was moderately dilated. - Atrial septum: No defect or patent foramen ovale was identified.  EKG on 07/21/14 showed: Afib, borderline RAD, borderline prolonged QT, non-sustained ventricular ectopy (versus artifact).   Carotid duplex 08/01/14: LEFT ICA 1-39% stenosis. Right VA flow antegrade. Unable to visual the right ICA and left VA due to poor patient cooperation. Unable to duplicate bilateral 02-77% ICA stenosis documented on prior 02/19/14 exam.  MRI of the brain on 07/31/14: Moderate-sized acute infarction affecting the LEFT inferior cerebellum, with hemorrhagic transformation medially. Recommend continued surveillance with CT scanning to exclude expanding posterior fossa hematoma. LEFT PICA thrombosis. Suspect adjacent LEFT vertebral dissection or focal plaque. Generalized atrophy with small vessel disease.  Head CT 08/01/14: Evolving LEFT posterior inferior cerebellar artery territory infarct with petechial hemorrhage. Involutional changes. Mild white matter changes suggest chronic small vessel ischemic disease. LEFT mesial frontal lobe encephalomalacia is likely post ischemic.  CXR on 07/21/14: Mild cardiac enlargement. No edema or consolidation.   Labs from 08/02/14 and 08/20/14 noted. His last BUN/CR on 08/20/14 were 78/1.61 up from 54/1.21 on 07/31/14 and 49/1.05 on 07/29/14.  AST/ALT 17/10 on 07/25/14.  PLT count 140K on 08/20/14. He is scheduled for repeat CBC and CMET on arrival which will reevaluation his renal function which has increased over the past month.  I reviewed above with  anesthesiologist Dr. Therisa Doyne.  I called and spoke with Dr. Leonie Man about plans for surgery.  In general waiting 3 months after a CVA is recommended in elective cases to decrease risk of progressive infarct.  In a case involving cancer, the surgeon may feel that the risk of postponing surgery due to cancer spread is greater than the increased neurologic risk associated with recent CVA.  If Dr. Excell Seltzer and the patient both understand these risks and are willing to proceed with surgery than proceeding would be reasonable.  However, Dr. Ola Spurr spoke with Dr. Excell Seltzer.  Plan to postpone surgery until 3 months post-CVA. He wants patient to follow-up with him in one month.  I have notified patient's daughter Michail Sermon [phone]).  George Hugh [facility] Short Stay Center/Anesthesiology Phone [phone] 08/22/2014 4:07 PM

## 2014-08-23 ENCOUNTER — Ambulatory Visit (HOSPITAL_COMMUNITY): Admission: RE | Admit: 2014-08-23 | Payer: Medicare Other | Source: Ambulatory Visit | Admitting: General Surgery

## 2014-08-23 ENCOUNTER — Other Ambulatory Visit (HOSPITAL_COMMUNITY): Payer: Medicare Other

## 2014-08-23 ENCOUNTER — Encounter (HOSPITAL_COMMUNITY): Admission: RE | Payer: Self-pay | Source: Ambulatory Visit

## 2014-08-23 HISTORY — DX: Reserved for inherently not codable concepts without codable children: IMO0001

## 2014-08-23 HISTORY — DX: Major depressive disorder, single episode, unspecified: F32.9

## 2014-08-23 HISTORY — DX: Disorder of kidney and ureter, unspecified: N28.9

## 2014-08-23 HISTORY — DX: Myoneural disorder, unspecified: G70.9

## 2014-08-23 HISTORY — DX: Cerebral infarction, unspecified: I63.9

## 2014-08-23 HISTORY — DX: Constipation, unspecified: K59.00

## 2014-08-23 HISTORY — DX: Depression, unspecified: F32.A

## 2014-08-23 HISTORY — DX: Unspecified osteoarthritis, unspecified site: M19.90

## 2014-08-23 HISTORY — DX: Gastro-esophageal reflux disease without esophagitis: K21.9

## 2014-08-23 LAB — CULTURE, ROUTINE-ABSCESS

## 2014-08-23 SURGERY — SIMPLE MASTECTOMY WITH AXILLARY SENTINEL NODE BIOPSY
Anesthesia: General | Laterality: Right

## 2014-08-29 ENCOUNTER — Ambulatory Visit (INDEPENDENT_AMBULATORY_CARE_PROVIDER_SITE_OTHER): Payer: Medicare Other | Admitting: Cardiovascular Disease

## 2014-08-29 ENCOUNTER — Encounter: Payer: Self-pay | Admitting: Cardiovascular Disease

## 2014-08-29 ENCOUNTER — Telehealth: Payer: Self-pay | Admitting: *Deleted

## 2014-08-29 ENCOUNTER — Encounter (HOSPITAL_COMMUNITY): Payer: Medicare Other

## 2014-08-29 VITALS — BP 110/62 | HR 73 | Ht 67.0 in | Wt 236.0 lb

## 2014-08-29 DIAGNOSIS — I5032 Chronic diastolic (congestive) heart failure: Secondary | ICD-10-CM

## 2014-08-29 DIAGNOSIS — I2510 Atherosclerotic heart disease of native coronary artery without angina pectoris: Secondary | ICD-10-CM

## 2014-08-29 DIAGNOSIS — I482 Chronic atrial fibrillation, unspecified: Secondary | ICD-10-CM

## 2014-08-29 DIAGNOSIS — I6523 Occlusion and stenosis of bilateral carotid arteries: Secondary | ICD-10-CM

## 2014-08-29 LAB — BASIC METABOLIC PANEL
BUN: 76 mg/dL — AB (ref 6–23)
CO2: 34 mEq/L — ABNORMAL HIGH (ref 19–32)
CREATININE: 1.7 mg/dL — AB (ref 0.4–1.5)
Calcium: 9.1 mg/dL (ref 8.4–10.5)
Chloride: 87 mEq/L — ABNORMAL LOW (ref 96–112)
GFR: 43.96 mL/min — ABNORMAL LOW (ref >60.00)
Glucose, Bld: 190 mg/dL — ABNORMAL HIGH (ref 70–99)
Potassium: 3.1 mEq/L — ABNORMAL LOW (ref 3.5–5.1)
Sodium: 134 mEq/L — ABNORMAL LOW (ref 135–145)

## 2014-08-29 MED ORDER — FUROSEMIDE 80 MG PO TABS: ORAL_TABLET | ORAL | Status: DC

## 2014-08-29 NOTE — Telephone Encounter (Signed)
Spoke with pt's daughter [name]) and reviewed lab results and recommendations from Dr. Angelena Form with her.  Pt has home health nurse through [facility].  Daughter requests follow up lab work (BMP) to be done by nurse on Wednesday 11/25.  Nurse is Investment banker, operational. Phone number for [facility] is [phone].  Office currently closed.  Will forward to triage to contact [facility] tomorrow to make arrangements for lab draw.  Daughter requests call when arrangements made.  She can be reached on her cell phone-[phone].

## 2014-08-29 NOTE — Progress Notes (Signed)
History of Present Illness: 68 yo WM with history of CAD s/p 4V CABG 2001, atrial fibrillation, COPD, OSA, obesity, venous insufficiency and diastolic CHF here for cardiac follow up. He has been followed by Dr. Olevia Perches. I saw him for the first time in May 2012. He had bypass surgery in 2001. In 2010 he developed paroxysmal fibrillation and has been in persistent atrial fibrillation. He was not felt to be a Coumadin candidate because of alcohol consumption. He has been followed in the CHF clinic.  Admitted to [facility] 04/14/13 through 04/27/13 with massive volume overload. Sluggish diuresis with intermittent lasix and he was transitioned to Lasix drip and dopamine drip at 3 mcg per hour. Diuresis was sluggish until diamox added with marked improvement. Most recently admitted July 2015 and October 2015 with CHF exacerbation. Had a CVA while in rehab in October 2015. He was seen in the CHF clinic 08/08/14 and was stable. Cleared for mastectomy at this visit. Stress myoview August 2015 without ischemia.  Moderate bilateral ICA stenosis, 60-79% by dopplers 2014 and repeat October 2015 with poor quality, velocity not felt to be increased in either carotid.   He is here today for follow up. No change in breathing. No chest pain.  LE edema is improved. OVerall doing well but having issues with abdominal op site oozing. In touch with surgery office. Breast cancer diagnosis and plans for upcoming breast surgery.   Primary Care Physician: Jene Every  Last Lipid profile:   Lipid Panel     Component Value Date/Time   CHOL 100 08/02/2014 0524   TRIG 88 08/02/2014 0524   HDL 34* 08/02/2014 0524   CHOLHDL 2.9 08/02/2014 0524   VLDL 18 08/02/2014 0524   LDLCALC 48 08/02/2014 0524    Past Medical History  Diagnosis Date  . CHRONIC OBSTRUCTIVE PULMONARY DISEASE 06/20/2009  . CAROTID STENOSIS 06/20/2009    A. 08/2001 s/p L CEA;  B.   09/14/11 - Carotid U/S - 40-59% bilateral stenosis, left CEA patch angioplasty is  patent  . DM 06/20/2009  . CAD 06/20/2009    A.  08/2000 - s/p CABG x 4 - LIMA-LAD, Left Radial-OM, VG-DIAG, VG-RCA;  B. Neg. MV  2010  . HYPERLIPIDEMIA 06/20/2009  . HYPERTENSION 06/20/2009  . Hypothyroidism   . Low back pain   . Asthma     as child  . Pneumonia   . Atrial fibrillation     Not felt to be coumadin candidate 2/2 ETOH use.  Marland Kitchen ETOH abuse     no alcohol since Oct. 2015  . History of tobacco abuse     remote - quit 1970  . Bilateral renal cysts   . Marijuana abuse   . Morbidly obese   . CHF (congestive heart failure)   . Falls frequently   . Hx of cardiovascular stress test 05/2014    Lexiscan Myoview (8/15):  No ischemia; EF 63% - Normal Study  . Breast cancer, right breast   . Gout   . OBSTRUCTIVE SLEEP APNEA 06/20/2009    does not use cpap  . Stroke     found on MRI in Oct. 2015.   Marland Kitchen Shortness of breath dyspnea     occasional  . Depression     takes Prozac  . Kidney disease     Stage 3  . GERD (gastroesophageal reflux disease)     takes Prilosec  . Arthritis   . Neuromuscular disorder     neuropathy in both  legs  . Constipation     Past Surgical History  Procedure Laterality Date  . Carotid endarterectomy  2002    left  . Coronary artery bypass graft      x 4 - 2001  . Umbilical hernia repair N/A 01/28/2014    Procedure: HERNIA REPAIR UMBILICAL ADULT/INC;  Surgeon: Harl Bowie, MD;  Location: [facility];  Service: General;  Laterality: N/A;  . Laparotomy N/A 01/28/2014    Procedure: EXPLORATORY LAPAROTOMY;  Surgeon: Harl Bowie, MD;  Location: [facility];  Service: General;  Laterality: N/A;  . Bowel resection N/A 01/28/2014    Procedure: SMALL BOWEL RESECTION;  Surgeon: Harl Bowie, MD;  Location: [facility];  Service: General;  Laterality: N/A;  . Hernia repair    . Colonoscopy      Current Outpatient Prescriptions  Medication Sig Dispense Refill  . allopurinol (ZYLOPRIM) 300 MG tablet Take 1 tablet (300 mg total) by mouth daily. 30 tablet 3   . amoxicillin-clavulanate (AUGMENTIN) 875-125 MG per tablet Take 1 tablet by mouth 2 (two) times daily. 20 tablet 0  . aspirin EC 325 MG EC tablet Take 1 tablet (325 mg total) by mouth daily. 30 tablet 0  . atorvastatin (LIPITOR) 20 MG tablet Take 1 tablet (20 mg total) by mouth daily. 30 tablet 1  . carvedilol (COREG) 3.125 MG tablet Take 1 tablet (3.125 mg total) by mouth 2 (two) times daily with a meal. 60 tablet 5  . colchicine 0.6 MG tablet Take 1 tablet (0.6 mg total) by mouth 2 (two) times daily. 30 tablet 0  . FLUoxetine (PROZAC) 20 MG capsule Take 1 capsule (20 mg total) by mouth daily. 30 capsule 3  . Fluticasone Furoate 200 MCG/ACT AEPB Inhale 1 puff into the lungs 2 (two) times daily. 30 each 1  . folic acid (FOLVITE) 1 MG tablet Take 1 tablet (1 mg total) by mouth daily. 30 tablet 1  . furosemide (LASIX) 80 MG tablet Take 2 tablets (160 mg total) by mouth 2 (two) times daily. 120 tablet 5  . gabapentin (NEURONTIN) 300 MG capsule Take 1 capsule (300 mg total) by mouth 2 (two) times daily. 60 capsule 1  . insulin glargine (LANTUS) 100 UNIT/ML injection Inject 0.08 mLs (8 Units total) into the skin at bedtime. (Patient taking differently: Inject 12 Units into the skin at bedtime. ) 10 mL 11  . lactulose (CHRONULAC) 10 GM/15ML solution Take 30 mLs (20 g total) by mouth 2 (two) times daily. 240 mL 0  . lactulose (CHRONULAC) 10 GM/15ML solution Take 30 mLs (20 g total) by mouth 2 (two) times daily. (Patient taking differently: Take 30 g by mouth 2 (two) times daily. ) 240 mL 0  . levothyroxine (SYNTHROID, LEVOTHROID) 125 MCG tablet Take 1.5 tablets (187 mcg total) by mouth daily before breakfast. 30 tablet 3  . metolazone (ZAROXOLYN) 5 MG tablet Take 1 tablet (5 mg total) by mouth once a week. Take on Friday  and as needed. Take with an extra 20 meq of potassium (Patient taking differently: Take 5 mg by mouth once a week. On Friday) 12 tablet 6  . Multiple Vitamin (MULTIVITAMIN WITH MINERALS)  TABS tablet Take 1 tablet by mouth daily.    Marland Kitchen omeprazole (PRILOSEC) 20 MG capsule Take 1 capsule (20 mg total) by mouth daily. 30 capsule 1  . oxyCODONE 10 MG TABS Take 1 tablet (10 mg total) by mouth every 6 (six) hours as needed for severe pain. [place]  tablet 0  . potassium chloride SA (K-DUR,KLOR-CON) 20 MEQ tablet Take 1 tablet (20 mEq total) by mouth daily. (Patient taking differently: Take 20-40 mEq by mouth daily. Take 2 tablets on Friday then take 1 tablet all other days) 30 tablet 1   No current facility-administered medications for this visit.    Allergies  Allergen Reactions  . Erythromycin Hives    History   Social History  . Marital Status: Single    Spouse Name: N/A    Number of Children: N/A  . Years of Education: N/A   Occupational History  . retired    Social History Main Topics  . Smoking status: Former Smoker    Quit date: 10/11/1968  . Smokeless tobacco: Never Used  . Alcohol Use: Yes     Comment: wild [place] daily;  also reports regularly drinking a pint of vodka.  . Drug Use: Yes    Special: Marijuana     Comment: last 1 year ago  . Sexual Activity: Not on file   Other Topics Concern  . Not on file   Social History Narrative   Lives in [place] with dtr, son-in-law    Family History  Problem Relation Age of Onset  . Stroke Mother     ?  Marland Kitchen Cancer Mother     unknown type of cancer  . Stroke Father     ?  Marland Kitchen Heart attack Neg Hx   . Cancer Brother 69    stomach cancer    Review of Systems:  As stated in the HPI and otherwise negative.   BP 110/62 mmHg  Pulse 73  Ht 5\' 7"  (1.702 m)  Wt 236 lb (107.049 kg)  BMI 36.95 kg/m2  SpO2 93%  Physical Examination: General: Well developed, well nourished, NAD HEENT: OP clear, mucus membranes moist SKIN: warm, dry. No rashes. Neuro: No focal deficits Musculoskeletal: Muscle strength 5/5 all ext Psychiatric: Mood and affect normal Neck: No JVD, no carotid bruits, no thyromegaly, no  lymphadenopathy. Lungs:Clear bilaterally, no wheezes, rhonci, crackles Cardiovascular: Regular rate and rhythm. No murmurs, gallops or rubs. Abdomen:Soft. Bowel sounds present. Non-tender.  Extremities: Trace bilateral lower extremity edema. Chronic venous stasis changes.   Echo 04/21/14: Left ventricle: The cavity size was normal. Wall thickness was normal. Systolic function was normal. The estimated ejection fraction was in the range of 50% to 55%. Doppler parameters are consistent with both elevated ventricular end-diastolic filling pressure and elevated left atrial filling pressure. - Mitral valve: Calcified annulus. Mildly thickened leaflets . - Left atrium: Moderately to severely dilated. - Right ventricle: The cavity size was moderately dilated. - Right atrium: The atrium was moderately dilated. - Atrial septum: No defect or patent foramen ovale was identified.  Assessment and Plan:   1. CAD: Stable. Stress myoview 06/27/13 without ischemia. LDL at goal <70 October 2015. Continue statin, beta blocker, ASA.     2. Atrial fibrillation: Rate controlled. We discussed his stroke in July. He is still not felt to a good coumadin candidate due to ongoing alcohol use. He admits to drinking several times per week over the last two weeks, not every day as he had before. He understands that we could consider anti-coagulation in the future if he stopped drinking alcohol.   3. Chronic DIASTOLIC HEART FAILURE:  Followed closely in the CHF clinic.  BMET in ED on 08/20/14 with worsened renal function. Will repeat BMET today.   4. Carotid artery disease: Moderate bilateral ICA stenosis by carotid  dopplers October 2015. Repeat in 6 months.

## 2014-08-29 NOTE — Patient Instructions (Signed)
Your physician wants you to follow-up in:  6 months. You will receive a reminder letter in the mail two months in advance. If you don't receive a letter, please call our office to schedule the follow-up appointment.   

## 2014-08-30 ENCOUNTER — Encounter (HOSPITAL_COMMUNITY): Payer: Self-pay | Admitting: Emergency Medicine

## 2014-08-30 ENCOUNTER — Emergency Department (HOSPITAL_COMMUNITY): Payer: Medicare Other

## 2014-08-30 ENCOUNTER — Inpatient Hospital Stay (HOSPITAL_COMMUNITY)
Admission: EM | Admit: 2014-08-30 | Discharge: 2014-09-01 | DRG: 394 | Disposition: A | Discharge status: Home / Self Care | Payer: Medicare Other | Attending: Internal Medicine | Admitting: Internal Medicine

## 2014-08-30 DIAGNOSIS — Z79899 Other long term (current) drug therapy: Secondary | ICD-10-CM

## 2014-08-30 DIAGNOSIS — G4733 Obstructive sleep apnea (adult) (pediatric): Secondary | ICD-10-CM | POA: Diagnosis present

## 2014-08-30 DIAGNOSIS — F1010 Alcohol abuse, uncomplicated: Secondary | ICD-10-CM | POA: Diagnosis present

## 2014-08-30 DIAGNOSIS — C50921 Malignant neoplasm of unspecified site of right male breast: Secondary | ICD-10-CM | POA: Diagnosis present

## 2014-08-30 DIAGNOSIS — R296 Repeated falls: Secondary | ICD-10-CM | POA: Diagnosis present

## 2014-08-30 DIAGNOSIS — L03316 Cellulitis of umbilicus: Secondary | ICD-10-CM | POA: Diagnosis present

## 2014-08-30 DIAGNOSIS — N179 Acute kidney failure, unspecified: Secondary | ICD-10-CM

## 2014-08-30 DIAGNOSIS — Z9049 Acquired absence of other specified parts of digestive tract: Secondary | ICD-10-CM | POA: Diagnosis present

## 2014-08-30 DIAGNOSIS — K632 Fistula of intestine: Secondary | ICD-10-CM | POA: Diagnosis not present

## 2014-08-30 DIAGNOSIS — N183 Chronic kidney disease, stage 3 (moderate): Secondary | ICD-10-CM | POA: Diagnosis present

## 2014-08-30 DIAGNOSIS — J449 Chronic obstructive pulmonary disease, unspecified: Secondary | ICD-10-CM | POA: Diagnosis present

## 2014-08-30 DIAGNOSIS — M109 Gout, unspecified: Secondary | ICD-10-CM | POA: Diagnosis present

## 2014-08-30 DIAGNOSIS — E119 Type 2 diabetes mellitus without complications: Secondary | ICD-10-CM

## 2014-08-30 DIAGNOSIS — E785 Hyperlipidemia, unspecified: Secondary | ICD-10-CM | POA: Diagnosis present

## 2014-08-30 DIAGNOSIS — E1151 Type 2 diabetes mellitus with diabetic peripheral angiopathy without gangrene: Secondary | ICD-10-CM | POA: Diagnosis present

## 2014-08-30 DIAGNOSIS — Z809 Family history of malignant neoplasm, unspecified: Secondary | ICD-10-CM

## 2014-08-30 DIAGNOSIS — Z7982 Long term (current) use of aspirin: Secondary | ICD-10-CM

## 2014-08-30 DIAGNOSIS — L02211 Cutaneous abscess of abdominal wall: Secondary | ICD-10-CM

## 2014-08-30 DIAGNOSIS — Z951 Presence of aortocoronary bypass graft: Secondary | ICD-10-CM

## 2014-08-30 DIAGNOSIS — IMO0002 Reserved for concepts with insufficient information to code with codable children: Secondary | ICD-10-CM | POA: Diagnosis present

## 2014-08-30 DIAGNOSIS — I5032 Chronic diastolic (congestive) heart failure: Secondary | ICD-10-CM | POA: Diagnosis present

## 2014-08-30 DIAGNOSIS — D696 Thrombocytopenia, unspecified: Secondary | ICD-10-CM | POA: Diagnosis present

## 2014-08-30 DIAGNOSIS — I2510 Atherosclerotic heart disease of native coronary artery without angina pectoris: Secondary | ICD-10-CM | POA: Diagnosis present

## 2014-08-30 DIAGNOSIS — Z6833 Body mass index (BMI) 33.0-33.9, adult: Secondary | ICD-10-CM

## 2014-08-30 DIAGNOSIS — Z8673 Personal history of transient ischemic attack (TIA), and cerebral infarction without residual deficits: Secondary | ICD-10-CM

## 2014-08-30 DIAGNOSIS — F1210 Cannabis abuse, uncomplicated: Secondary | ICD-10-CM | POA: Diagnosis present

## 2014-08-30 DIAGNOSIS — Z881 Allergy status to other antibiotic agents status: Secondary | ICD-10-CM

## 2014-08-30 DIAGNOSIS — Z9862 Peripheral vascular angioplasty status: Secondary | ICD-10-CM

## 2014-08-30 DIAGNOSIS — Z794 Long term (current) use of insulin: Secondary | ICD-10-CM

## 2014-08-30 DIAGNOSIS — E039 Hypothyroidism, unspecified: Secondary | ICD-10-CM | POA: Diagnosis present

## 2014-08-30 DIAGNOSIS — Z823 Family history of stroke: Secondary | ICD-10-CM

## 2014-08-30 DIAGNOSIS — E1165 Type 2 diabetes mellitus with hyperglycemia: Secondary | ICD-10-CM | POA: Diagnosis present

## 2014-08-30 DIAGNOSIS — K219 Gastro-esophageal reflux disease without esophagitis: Secondary | ICD-10-CM | POA: Diagnosis present

## 2014-08-30 DIAGNOSIS — I10 Essential (primary) hypertension: Secondary | ICD-10-CM

## 2014-08-30 DIAGNOSIS — N281 Cyst of kidney, acquired: Secondary | ICD-10-CM | POA: Diagnosis present

## 2014-08-30 DIAGNOSIS — I4891 Unspecified atrial fibrillation: Secondary | ICD-10-CM | POA: Diagnosis present

## 2014-08-30 DIAGNOSIS — Z87891 Personal history of nicotine dependence: Secondary | ICD-10-CM

## 2014-08-30 DIAGNOSIS — E46 Unspecified protein-calorie malnutrition: Secondary | ICD-10-CM | POA: Diagnosis present

## 2014-08-30 DIAGNOSIS — I6523 Occlusion and stenosis of bilateral carotid arteries: Secondary | ICD-10-CM | POA: Diagnosis present

## 2014-08-30 DIAGNOSIS — I129 Hypertensive chronic kidney disease with stage 1 through stage 4 chronic kidney disease, or unspecified chronic kidney disease: Secondary | ICD-10-CM | POA: Diagnosis present

## 2014-08-30 DIAGNOSIS — M1990 Unspecified osteoarthritis, unspecified site: Secondary | ICD-10-CM | POA: Diagnosis present

## 2014-08-30 DIAGNOSIS — E1142 Type 2 diabetes mellitus with diabetic polyneuropathy: Secondary | ICD-10-CM | POA: Diagnosis present

## 2014-08-30 LAB — COMPREHENSIVE METABOLIC PANEL
ALT: 13 U/L (ref 0–53)
AST: 23 U/L (ref 0–37)
Albumin: 3.2 g/dL — ABNORMAL LOW (ref 3.5–5.2)
Alkaline Phosphatase: 80 U/L (ref 39–117)
Anion gap: 14 (ref 5–15)
BUN: 69 mg/dL — ABNORMAL HIGH (ref 6–23)
CALCIUM: 9.6 mg/dL (ref 8.4–10.5)
CO2: 31 meq/L (ref 19–32)
CREATININE: 1.56 mg/dL — AB (ref 0.50–1.35)
Chloride: 92 mEq/L — ABNORMAL LOW (ref 96–112)
GFR calc Af Amer: 50 mL/min — ABNORMAL LOW (ref >90)
GFR, EST NON AFRICAN AMERICAN: 43 mL/min — AB (ref >90)
Glucose, Bld: 174 mg/dL — ABNORMAL HIGH (ref 70–99)
Potassium: 3.4 mEq/L — ABNORMAL LOW (ref 3.7–5.3)
Sodium: 137 mEq/L (ref 137–147)
TOTAL PROTEIN: 7.3 g/dL (ref 6.0–8.3)
Total Bilirubin: 0.6 mg/dL (ref 0.3–1.2)

## 2014-08-30 LAB — CBC WITH DIFFERENTIAL/PLATELET
BASOS ABS: 0 10*3/uL (ref 0.0–0.1)
BASOS PCT: 0 % (ref 0–1)
EOS ABS: 0.7 10*3/uL (ref 0.0–0.7)
EOS PCT: 11 % — AB (ref 0–5)
HCT: 36.4 % — ABNORMAL LOW (ref 39.0–52.0)
Hemoglobin: 11.8 g/dL — ABNORMAL LOW (ref 13.0–17.0)
LYMPHS PCT: 22 % (ref 12–46)
Lymphs Abs: 1.4 10*3/uL (ref 0.7–4.0)
MCH: 30.6 pg (ref 26.0–34.0)
MCHC: 32.4 g/dL (ref 30.0–36.0)
MCV: 94.3 fL (ref 78.0–100.0)
Monocytes Absolute: 0.6 10*3/uL (ref 0.1–1.0)
Monocytes Relative: 9 % (ref 3–12)
Neutro Abs: 3.7 10*3/uL (ref 1.7–7.7)
Neutrophils Relative %: 58 % (ref 43–77)
PLATELETS: 136 10*3/uL — AB (ref 150–400)
RBC: 3.86 MIL/uL — ABNORMAL LOW (ref 4.22–5.81)
RDW: 17.9 % — AB (ref 11.5–15.5)
WBC: 6.4 10*3/uL (ref 4.0–10.5)

## 2014-08-30 MED ORDER — SODIUM CHLORIDE 0.9 % IV BOLUS (SEPSIS)
500.0000 mL | Freq: Once | INTRAVENOUS | Status: AC
  Administered 2014-08-30: 500 mL via INTRAVENOUS

## 2014-08-30 MED ORDER — IOHEXOL 300 MG/ML  SOLN
80.0000 mL | Freq: Once | INTRAMUSCULAR | Status: AC | PRN
  Administered 2014-08-30: 80 mL via INTRAVENOUS

## 2014-08-30 MED ORDER — MORPHINE SULFATE 4 MG/ML IJ SOLN
4.0000 mg | Freq: Once | INTRAMUSCULAR | Status: AC
  Administered 2014-08-30: 4 mg via INTRAVENOUS
  Filled 2014-08-30: qty 1

## 2014-08-30 MED ORDER — IOHEXOL 300 MG/ML  SOLN
25.0000 mL | Freq: Once | INTRAMUSCULAR | Status: AC | PRN
  Administered 2014-08-30: 25 mL via ORAL

## 2014-08-30 MED ORDER — MORPHINE SULFATE 2 MG/ML IJ SOLN
2.0000 mg | Freq: Once | INTRAMUSCULAR | Status: AC
  Administered 2014-08-30: 2 mg via INTRAVENOUS
  Filled 2014-08-30: qty 1

## 2014-08-30 NOTE — ED Notes (Signed)
Called ct to notify that pt was done with contrast.

## 2014-08-30 NOTE — ED Notes (Addendum)
Pt here for umbilical abscess with increased pain, redness and drainage over the past couple days.  Pt was seen here at [facility] for the abscess on 08/21/19 and was given antibiotics which finished today.  Pt has been keeping peroxide on abscess. Pt also reports he hasnt been able to pass stool.  Last bowel movement was yesterday and pt described it as "runny" after taking laxative.  Pt currently in no pain.  Yellow drainage noted to gauze dressing. Pt reports h/a on left side starting yesterday with hx of a stroke. Pt has left sided deficits from prior stroke, but denies any new deficits. Pt reports being "really gassed up."

## 2014-08-30 NOTE — ED Notes (Signed)
Pt desated on monitor to 85%.  Spoke to dr. Reather Converse to put pt on 2L.

## 2014-08-30 NOTE — ED Notes (Signed)
Surgeon at bedside.  

## 2014-08-30 NOTE — ED Notes (Signed)
Pt. reports progressing skin redness/swelling with drainage at umbilicus for several days , completed his oral antibiotic this morning with no improvement . Denies fever or chills.

## 2014-08-30 NOTE — ED Provider Notes (Signed)
CSN: [id number]     Arrival date & time 08/30/14  1905 History   First MD Initiated Contact with Patient 08/30/14 1932     Chief Complaint  Patient presents with  . Abscess     (Consider location/radiation/quality/duration/timing/severity/associated sxs/prior Treatment) HPI Comments: 68 year old male with history of heart disease, atrial fibrillation, diabetes, sleep apnea, recent partial bowel resection and hernia repair in April by Dr. Ninfa Linden presents with discharge and pain from umbilicus. Patient was seen and given antibiotics for cellulitis and possible abscess, patient spoke with general surgery on the phone but has not been evaluated yet. Patient felt that his cellulitis improved however he said worsening discharge and drainage from the umbilicus. Patient has not had incision and drainage. Tender to palpation. No fevers or chills mild nausea.  Patient is a 68 y.o. male presenting with abscess. The history is provided by the patient.  Abscess Associated symptoms: nausea   Associated symptoms: no fever, no headaches and no vomiting     Past Medical History  Diagnosis Date  . CHRONIC OBSTRUCTIVE PULMONARY DISEASE 06/20/2009  . CAROTID STENOSIS 06/20/2009    A. 08/2001 s/p L CEA;  B.   09/14/11 - Carotid U/S - 40-59% bilateral stenosis, left CEA patch angioplasty is patent  . DM 06/20/2009  . CAD 06/20/2009    A.  08/2000 - s/p CABG x 4 - LIMA-LAD, Left Radial-OM, VG-DIAG, VG-RCA;  B. Neg. MV  2010  . HYPERLIPIDEMIA 06/20/2009  . HYPERTENSION 06/20/2009  . Hypothyroidism   . Low back pain   . Asthma     as child  . Pneumonia   . Atrial fibrillation     Not felt to be coumadin candidate 2/2 ETOH use.  Marland Kitchen ETOH abuse     no alcohol since Oct. 2015  . History of tobacco abuse     remote - quit 1970  . Bilateral renal cysts   . Marijuana abuse   . Morbidly obese   . CHF (congestive heart failure)   . Falls frequently   . Hx of cardiovascular stress test 05/2014    Lexiscan  Myoview (8/15):  No ischemia; EF 63% - Normal Study  . Breast cancer, right breast   . Gout   . OBSTRUCTIVE SLEEP APNEA 06/20/2009    does not use cpap  . Stroke     found on MRI in Oct. 2015.   Marland Kitchen Shortness of breath dyspnea     occasional  . Depression     takes Prozac  . Kidney disease     Stage 3  . GERD (gastroesophageal reflux disease)     takes Prilosec  . Arthritis   . Neuromuscular disorder     neuropathy in both legs  . Constipation    Past Surgical History  Procedure Laterality Date  . Carotid endarterectomy  2002    left  . Coronary artery bypass graft      x 4 - 2001  . Umbilical hernia repair N/A 01/28/2014    Procedure: HERNIA REPAIR UMBILICAL ADULT/INC;  Surgeon: Harl Bowie, MD;  Location: [facility];  Service: General;  Laterality: N/A;  . Laparotomy N/A 01/28/2014    Procedure: EXPLORATORY LAPAROTOMY;  Surgeon: Harl Bowie, MD;  Location: [facility];  Service: General;  Laterality: N/A;  . Bowel resection N/A 01/28/2014    Procedure: SMALL BOWEL RESECTION;  Surgeon: Harl Bowie, MD;  Location: [facility];  Service: General;  Laterality: N/A;  . Hernia repair    .  Colonoscopy     Family History  Problem Relation Age of Onset  . Stroke Mother     ?  Marland Kitchen Cancer Mother     unknown type of cancer  . Stroke Father     ?  Marland Kitchen Heart attack Neg Hx   . Cancer Brother 54    stomach cancer   History  Substance Use Topics  . Smoking status: Former Smoker    Quit date: 10/11/1968  . Smokeless tobacco: Never Used  . Alcohol Use: Yes     Comment: wild [place] daily;  also reports regularly drinking a pint of vodka.    Review of Systems  Constitutional: Negative for fever and chills.  HENT: Negative for congestion.   Eyes: Negative for visual disturbance.  Respiratory: Negative for shortness of breath.   Cardiovascular: Negative for chest pain.  Gastrointestinal: Positive for nausea and abdominal pain. Negative for vomiting.  Genitourinary: Negative for  dysuria and flank pain.  Musculoskeletal: Negative for back pain, neck pain and neck stiffness.  Skin: Positive for rash.  Neurological: Negative for light-headedness and headaches.      Allergies  Erythromycin  Home Medications   Prior to Admission medications   Medication Sig Start Date End Date Taking? Authorizing Provider  allopurinol (ZYLOPRIM) 300 MG tablet Take 1 tablet (300 mg total) by mouth daily. 08/02/14  Yes Daniel J Angiulli, PA-C  aspirin EC 325 MG EC tablet Take 1 tablet (325 mg total) by mouth daily. 08/02/14  Yes Daniel J Angiulli, PA-C  atorvastatin (LIPITOR) 20 MG tablet Take 1 tablet (20 mg total) by mouth daily. 08/02/14  Yes Daniel J Angiulli, PA-C  carvedilol (COREG) 3.125 MG tablet Take 1 tablet (3.125 mg total) by mouth 2 (two) times daily with a meal. 08/02/14  Yes Daniel J Angiulli, PA-C  colchicine 0.6 MG tablet Take 1 tablet (0.6 mg total) by mouth 2 (two) times daily. 08/02/14  Yes Daniel J Angiulli, PA-C  FLUoxetine (PROZAC) 20 MG capsule Take 1 capsule (20 mg total) by mouth daily. 08/02/14  Yes Daniel J Angiulli, PA-C  Fluticasone Furoate 200 MCG/ACT AEPB Inhale 1 puff into the lungs 2 (two) times daily. 08/02/14  Yes Daniel J Angiulli, PA-C  folic acid (FOLVITE) 1 MG tablet Take 1 tablet (1 mg total) by mouth daily. 08/02/14  Yes Daniel J Angiulli, PA-C  furosemide (LASIX) 80 MG tablet Take 2 tablets in the AM and 1 in the PM Patient taking differently: Take 160 mg by mouth See admin instructions. Take to tablets in the morning and 1 tablet in the afternoon per daughter 08/29/14  Yes Burnell Blanks, MD  gabapentin (NEURONTIN) 300 MG capsule Take 1 capsule (300 mg total) by mouth 2 (two) times daily. 08/02/14  Yes Daniel J Angiulli, PA-C  insulin glargine (LANTUS) 100 UNIT/ML injection Inject 0.08 mLs (8 Units total) into the skin at bedtime. Patient taking differently: Inject 12 Units into the skin at bedtime.  08/02/14  Yes Daniel J Angiulli,  PA-C  lactulose (CHRONULAC) 10 GM/15ML solution Take 30 mLs (20 g total) by mouth 2 (two) times daily. 08/02/14  Yes Daniel J Angiulli, PA-C  levothyroxine (SYNTHROID, LEVOTHROID) 125 MCG tablet Take 1.5 tablets (187 mcg total) by mouth daily before breakfast. 08/02/14  Yes Daniel J Angiulli, PA-C  metolazone (ZAROXOLYN) 5 MG tablet Take 1 tablet (5 mg total) by mouth once a week. Take on Friday  and as needed. Take with an extra 20 meq of potassium Patient taking  differently: Take 5 mg by mouth once a week. On Friday 08/08/14  Yes Amy D Clegg, NP  Multiple Vitamin (MULTIVITAMIN WITH MINERALS) TABS tablet Take 1 tablet by mouth daily.   Yes Historical Provider, MD  omeprazole (PRILOSEC) 20 MG capsule Take 1 capsule (20 mg total) by mouth daily. 08/02/14  Yes Daniel J Angiulli, PA-C  oxyCODONE 10 MG TABS Take 1 tablet (10 mg total) by mouth every 6 (six) hours as needed for severe pain. 08/02/14  Yes Daniel J Angiulli, PA-C  potassium chloride SA (K-DUR,KLOR-CON) 20 MEQ tablet Take 1 tablet (20 mEq total) by mouth daily. Patient taking differently: Take 20-40 mEq by mouth daily. Take 2 tablets on Friday then take 1 tablet all other days 08/02/14  Yes Daniel J Angiulli, PA-C  amoxicillin-clavulanate (AUGMENTIN) 875-125 MG per tablet Take 1 tablet by mouth 2 (two) times daily. 08/20/14   Kalman Drape, MD  lactulose (CHRONULAC) 10 GM/15ML solution Take 30 mLs (20 g total) by mouth 2 (two) times daily. Patient not taking: Reported on 08/30/2014 08/02/14   Lavon Paganini Angiulli, PA-C   BP 140/69 mmHg  Pulse 57  Temp(Src) 97.3 F (36.3 C) (Oral)  Resp 23  Ht 5' 9.5" (1.765 m)  Wt 232 lb (105.235 kg)  BMI 33.78 kg/m2  SpO2 100% Physical Exam  Constitutional: He is oriented to person, place, and time. He appears well-developed and well-nourished.  HENT:  Head: Normocephalic and atraumatic.  Eyes: Conjunctivae are normal. Right eye exhibits no discharge. Left eye exhibits no discharge.  Neck: Normal  range of motion. Neck supple. No tracheal deviation present.  Cardiovascular: Normal rate and regular rhythm.   Pulmonary/Chest: Effort normal and breath sounds normal.  Abdominal: Soft. He exhibits no distension. There is tenderness. There is no guarding.  Musculoskeletal: He exhibits no edema.  Neurological: He is alert and oriented to person, place, and time.  Skin: Skin is warm. Rash noted.  Patient has mild tenderness and erythema umbilicus, yellow drainage, mild focal firmness deep to superior umbilicus palpated.  Psychiatric: He has a normal mood and affect.  Nursing note and vitals reviewed.   ED Course  Procedures (including critical care time) Labs Review Labs Reviewed  CBC WITH DIFFERENTIAL - Abnormal; Notable for the following:    RBC 3.86 (*)    Hemoglobin 11.8 (*)    HCT 36.4 (*)    RDW 17.9 (*)    Platelets 136 (*)    Eosinophils Relative 11 (*)    All other components within normal limits  COMPREHENSIVE METABOLIC PANEL - Abnormal; Notable for the following:    Potassium 3.4 (*)    Chloride 92 (*)    Glucose, Bld 174 (*)    BUN 69 (*)    Creatinine, Ser 1.56 (*)    Albumin 3.2 (*)    GFR calc non Af Amer 43 (*)    GFR calc Af Amer 50 (*)    All other components within normal limits    Imaging Review Ct Abdomen Pelvis W Contrast  08/30/2014   CLINICAL DATA:  Erythema, swelling and drainage from the umbilicus for several days. Antibiotic therapy completed today. History of diabetes and substance abuse. History of hernia repair and bowel resection in April 2015. Initial encounter.  EXAM: CT ABDOMEN AND PELVIS WITH CONTRAST  TECHNIQUE: Multidetector CT imaging of the abdomen and pelvis was performed using the standard protocol following bolus administration of intravenous contrast.  CONTRAST:  69mL OMNIPAQUE IOHEXOL 300 MG/ML  SOLN  COMPARISON:  Abdominal pelvic CT 01/27/2014.  FINDINGS: Lower chest: Patchy atelectasis or scarring in both lung bases is similar to the  prior examination. There is no significant pleural or pericardial effusion. There is extensive atherosclerosis of the coronary arteries status post median sternotomy and CABG. There is a partially calcified 1.9 cm nodule within the right breast on image number 4, not previously imaged.  Hepatobiliary: The liver is normal in density without focal abnormality. No evidence of gallstones, gallbladder wall thickening or biliary dilatation.  Pancreas: Stable atrophy without focal abnormality. No pancreatic ductal dilatation.  Spleen: Normal in size without focal abnormality.  Adrenals/Urinary Tract: Both adrenal glands appear normal.The kidneys appear stable with perinephric soft tissue stranding and multiple low density lesions bilaterally. There is no hydronephrosis or significant delay in contrast excretion. Mild bladder wall thickening is similar to the prior study.  Stomach/Bowel: Thickening of the proximal stomach is likely due to incomplete distention. There is no small or large bowel wall thickening. Right lower quadrant small bowel anastomosis appears patent. As noted below, these postsurgical changes abut the anterior abdominal wall and umbilicus. There is no evidence of bowel obstruction or extraluminal fluid collection.  Vascular/Lymphatic: Small retroperitoneal and pelvic lymph nodes are stable there is grossly stable diffuse atherosclerosis of the aorta, its branches and iliac arteries.  Reproductive: Stable.  Other: There are postsurgical changes related to interval umbilical hernia repair. Within the umbilicus, there is a tubular soft tissue structure measuring up to 4.6 x 2.1 cm. There is no air or contrast material within the anterior abdominal wall, although the contrast has not yet passed through the anastomosis. This umbilical process abuts the anastomosis clips in the right lower quadrant.  Musculoskeletal: No acute or significant osseous findings. There are extensive degenerative changes throughout  the spine.  IMPRESSION: 1. Interval umbilical hernia repair and partial small bowel resection. There is tubular soft tissue within the anterior abdominal wall which abuts the postsurgical changes within the peritoneal cavity. 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 2. No evidence of intra-abdominal abscess or bowel obstruction. 3. Stable bilateral low density renal lesions. 4. Grossly stable diffuse atherosclerosis. 5. Indeterminate partially calcified nodule in the right breast. Patient did undergo right breast biopsy on 05/16/2014.   Electronically Signed   By: Camie Patience M.D.   On: 08/30/2014 21:40     EKG Interpretation None      MDM   Final diagnoses:  Abdominal wall abscess  Enterocutaneous fistula  ARF  Well-appearing male with worsening drainage and pain to the umbilicus. Clinical concern for deep abscess. Bedside ultrasound showed fluid collection. CT ordered for further delineation. CT results reviewed concerning for enterocutaneous fistula. Patient afebrile, vitals unremarkable, minimal pain. Paged general surgery to discuss likely outpatient follow-up.  CT results reviewed concerning for fistula. Consult at surgery who recommended medicine admission and they would be on consult.  The patients results and plan were reviewed and discussed.   Any x-rays performed were personally reviewed by myself.   Differential diagnosis were considered with the presenting HPI.  Medications  morphine 2 MG/ML injection 2 mg (2 mg Intravenous Given 08/30/14 2125)  sodium chloride 0.9 % bolus 500 mL (0 mLs Intravenous Stopped 08/30/14 2224)  iohexol (OMNIPAQUE) 300 MG/ML solution 25 mL (25 mLs Oral Contrast Given 08/30/14 2011)  iohexol (OMNIPAQUE) 300 MG/ML solution 80 mL (80 mLs Intravenous Contrast Given 08/30/14 2112)  morphine 4 MG/ML injection 4 mg (4 mg Intravenous Given  08/30/14 2242)    Filed Vitals:   08/30/14 1909  08/30/14 2000 08/30/14 2300  BP: 130/58 144/79 140/69  Pulse: 63 63 57  Temp: 97.3 F (36.3 C)    TempSrc: Oral    Resp: 12 18 23   Height: 5' 9.5" (1.765 m)    Weight: 232 lb (105.235 kg)    SpO2: 97% 99% 100%    Final diagnoses:  Abdominal wall abscess  Enterocutaneous fistula    Admission/ observation were discussed with the admitting physician, patient and/or family and they are comfortable with the plan.    Mariea Clonts, MD 08/30/14 [phone]

## 2014-08-30 NOTE — Telephone Encounter (Signed)
Spoke with Nada Maclachlan at [facility]. Arrangements have been made for BMP to be done by [facility] 09/04/14. Pt's daughter, [name] advised BMP has been scheduled to be done 09/04/14 by [place].

## 2014-08-31 ENCOUNTER — Encounter (HOSPITAL_COMMUNITY): Payer: Self-pay | Admitting: General Practice

## 2014-08-31 DIAGNOSIS — E785 Hyperlipidemia, unspecified: Secondary | ICD-10-CM | POA: Diagnosis present

## 2014-08-31 DIAGNOSIS — K632 Fistula of intestine: Secondary | ICD-10-CM | POA: Diagnosis present

## 2014-08-31 DIAGNOSIS — I10 Essential (primary) hypertension: Secondary | ICD-10-CM

## 2014-08-31 DIAGNOSIS — F1210 Cannabis abuse, uncomplicated: Secondary | ICD-10-CM | POA: Diagnosis present

## 2014-08-31 DIAGNOSIS — Z79899 Other long term (current) drug therapy: Secondary | ICD-10-CM | POA: Diagnosis not present

## 2014-08-31 DIAGNOSIS — Z809 Family history of malignant neoplasm, unspecified: Secondary | ICD-10-CM | POA: Diagnosis not present

## 2014-08-31 DIAGNOSIS — I4891 Unspecified atrial fibrillation: Secondary | ICD-10-CM | POA: Diagnosis present

## 2014-08-31 DIAGNOSIS — Z951 Presence of aortocoronary bypass graft: Secondary | ICD-10-CM | POA: Diagnosis not present

## 2014-08-31 DIAGNOSIS — M1990 Unspecified osteoarthritis, unspecified site: Secondary | ICD-10-CM | POA: Diagnosis present

## 2014-08-31 DIAGNOSIS — M109 Gout, unspecified: Secondary | ICD-10-CM | POA: Diagnosis present

## 2014-08-31 DIAGNOSIS — C50921 Malignant neoplasm of unspecified site of right male breast: Secondary | ICD-10-CM | POA: Diagnosis present

## 2014-08-31 DIAGNOSIS — I6523 Occlusion and stenosis of bilateral carotid arteries: Secondary | ICD-10-CM | POA: Diagnosis present

## 2014-08-31 DIAGNOSIS — Z9862 Peripheral vascular angioplasty status: Secondary | ICD-10-CM | POA: Diagnosis not present

## 2014-08-31 DIAGNOSIS — Z823 Family history of stroke: Secondary | ICD-10-CM | POA: Diagnosis not present

## 2014-08-31 DIAGNOSIS — J449 Chronic obstructive pulmonary disease, unspecified: Secondary | ICD-10-CM | POA: Diagnosis present

## 2014-08-31 DIAGNOSIS — I5032 Chronic diastolic (congestive) heart failure: Secondary | ICD-10-CM | POA: Diagnosis present

## 2014-08-31 DIAGNOSIS — E46 Unspecified protein-calorie malnutrition: Secondary | ICD-10-CM | POA: Diagnosis present

## 2014-08-31 DIAGNOSIS — I129 Hypertensive chronic kidney disease with stage 1 through stage 4 chronic kidney disease, or unspecified chronic kidney disease: Secondary | ICD-10-CM | POA: Diagnosis present

## 2014-08-31 DIAGNOSIS — Z7982 Long term (current) use of aspirin: Secondary | ICD-10-CM | POA: Diagnosis not present

## 2014-08-31 DIAGNOSIS — E1142 Type 2 diabetes mellitus with diabetic polyneuropathy: Secondary | ICD-10-CM | POA: Diagnosis present

## 2014-08-31 DIAGNOSIS — E1151 Type 2 diabetes mellitus with diabetic peripheral angiopathy without gangrene: Secondary | ICD-10-CM | POA: Diagnosis present

## 2014-08-31 DIAGNOSIS — E1165 Type 2 diabetes mellitus with hyperglycemia: Secondary | ICD-10-CM | POA: Diagnosis present

## 2014-08-31 DIAGNOSIS — D696 Thrombocytopenia, unspecified: Secondary | ICD-10-CM | POA: Diagnosis present

## 2014-08-31 DIAGNOSIS — K219 Gastro-esophageal reflux disease without esophagitis: Secondary | ICD-10-CM | POA: Diagnosis present

## 2014-08-31 DIAGNOSIS — E039 Hypothyroidism, unspecified: Secondary | ICD-10-CM | POA: Diagnosis present

## 2014-08-31 DIAGNOSIS — N183 Chronic kidney disease, stage 3 (moderate): Secondary | ICD-10-CM | POA: Diagnosis present

## 2014-08-31 DIAGNOSIS — R296 Repeated falls: Secondary | ICD-10-CM | POA: Diagnosis present

## 2014-08-31 DIAGNOSIS — L03316 Cellulitis of umbilicus: Secondary | ICD-10-CM

## 2014-08-31 DIAGNOSIS — G4733 Obstructive sleep apnea (adult) (pediatric): Secondary | ICD-10-CM

## 2014-08-31 DIAGNOSIS — Z8673 Personal history of transient ischemic attack (TIA), and cerebral infarction without residual deficits: Secondary | ICD-10-CM | POA: Diagnosis not present

## 2014-08-31 DIAGNOSIS — I2510 Atherosclerotic heart disease of native coronary artery without angina pectoris: Secondary | ICD-10-CM | POA: Diagnosis present

## 2014-08-31 DIAGNOSIS — N281 Cyst of kidney, acquired: Secondary | ICD-10-CM | POA: Diagnosis present

## 2014-08-31 DIAGNOSIS — Z6833 Body mass index (BMI) 33.0-33.9, adult: Secondary | ICD-10-CM | POA: Diagnosis not present

## 2014-08-31 DIAGNOSIS — Z87891 Personal history of nicotine dependence: Secondary | ICD-10-CM | POA: Diagnosis not present

## 2014-08-31 DIAGNOSIS — Z9049 Acquired absence of other specified parts of digestive tract: Secondary | ICD-10-CM | POA: Diagnosis present

## 2014-08-31 DIAGNOSIS — F1010 Alcohol abuse, uncomplicated: Secondary | ICD-10-CM | POA: Diagnosis present

## 2014-08-31 DIAGNOSIS — Z881 Allergy status to other antibiotic agents status: Secondary | ICD-10-CM | POA: Diagnosis not present

## 2014-08-31 DIAGNOSIS — Z794 Long term (current) use of insulin: Secondary | ICD-10-CM | POA: Diagnosis not present

## 2014-08-31 LAB — COMPREHENSIVE METABOLIC PANEL
ALBUMIN: 3 g/dL — AB (ref 3.5–5.2)
ALK PHOS: 74 U/L (ref 39–117)
ALT: 12 U/L (ref 0–53)
AST: 20 U/L (ref 0–37)
Anion gap: 16 — ABNORMAL HIGH (ref 5–15)
BUN: 65 mg/dL — ABNORMAL HIGH (ref 6–23)
CO2: 31 mEq/L (ref 19–32)
Calcium: 9.3 mg/dL (ref 8.4–10.5)
Chloride: 93 mEq/L — ABNORMAL LOW (ref 96–112)
Creatinine, Ser: 1.31 mg/dL (ref 0.50–1.35)
GFR calc non Af Amer: 54 mL/min — ABNORMAL LOW (ref >90)
GFR, EST AFRICAN AMERICAN: 62 mL/min — AB (ref >90)
Glucose, Bld: 168 mg/dL — ABNORMAL HIGH (ref 70–99)
POTASSIUM: 3.3 meq/L — AB (ref 3.7–5.3)
Sodium: 140 mEq/L (ref 137–147)
TOTAL PROTEIN: 6.7 g/dL (ref 6.0–8.3)
Total Bilirubin: 0.7 mg/dL (ref 0.3–1.2)

## 2014-08-31 LAB — HEMOGLOBIN A1C
HEMOGLOBIN A1C: 7.1 % — AB (ref <5.7)
Mean Plasma Glucose: 157 mg/dL — ABNORMAL HIGH (ref <117)

## 2014-08-31 LAB — GLUCOSE, CAPILLARY
GLUCOSE-CAPILLARY: 189 mg/dL — AB (ref 70–99)
GLUCOSE-CAPILLARY: 152 mg/dL — AB (ref 70–99)
Glucose-Capillary: 141 mg/dL — ABNORMAL HIGH (ref 70–99)
Glucose-Capillary: 168 mg/dL — ABNORMAL HIGH (ref 70–99)
Glucose-Capillary: 177 mg/dL — ABNORMAL HIGH (ref 70–99)

## 2014-08-31 LAB — CBC
HEMATOCRIT: 35 % — AB (ref 39.0–52.0)
HEMOGLOBIN: 11.1 g/dL — AB (ref 13.0–17.0)
MCH: 30.2 pg (ref 26.0–34.0)
MCHC: 31.7 g/dL (ref 30.0–36.0)
MCV: 95.4 fL (ref 78.0–100.0)
Platelets: 127 10*3/uL — ABNORMAL LOW (ref 150–400)
RBC: 3.67 MIL/uL — ABNORMAL LOW (ref 4.22–5.81)
RDW: 18 % — ABNORMAL HIGH (ref 11.5–15.5)
WBC: 6.4 10*3/uL (ref 4.0–10.5)

## 2014-08-31 LAB — TSH: TSH: 0.986 u[IU]/mL (ref 0.350–4.500)

## 2014-08-31 MED ORDER — FLUOXETINE HCL 20 MG PO CAPS
20.0000 mg | ORAL_CAPSULE | Freq: Every day | ORAL | Status: DC
  Administered 2014-08-31 – 2014-09-01 (×2): 20 mg via ORAL
  Filled 2014-08-31 (×2): qty 1

## 2014-08-31 MED ORDER — HEPARIN SODIUM (PORCINE) 5000 UNIT/ML IJ SOLN
5000.0000 [IU] | Freq: Three times a day (TID) | INTRAMUSCULAR | Status: DC
  Administered 2014-08-31 – 2014-09-01 (×5): 5000 [IU] via SUBCUTANEOUS
  Filled 2014-08-31 (×7): qty 1

## 2014-08-31 MED ORDER — ONDANSETRON HCL 4 MG/2ML IJ SOLN: 4.0000 mg | Freq: Four times a day (QID) | INTRAMUSCULAR | Status: DC | PRN

## 2014-08-31 MED ORDER — ATORVASTATIN CALCIUM 20 MG PO TABS
20.0000 mg | ORAL_TABLET | Freq: Every day | ORAL | Status: DC
  Administered 2014-08-31 – 2014-09-01 (×2): 20 mg via ORAL
  Filled 2014-08-31 (×2): qty 1

## 2014-08-31 MED ORDER — POTASSIUM CHLORIDE CRYS ER 20 MEQ PO TBCR
20.0000 meq | EXTENDED_RELEASE_TABLET | Freq: Every day | ORAL | Status: DC
  Administered 2014-08-31 – 2014-09-01 (×2): 20 meq via ORAL
  Filled 2014-08-31 (×3): qty 1

## 2014-08-31 MED ORDER — COLCHICINE 0.6 MG PO TABS
0.6000 mg | ORAL_TABLET | Freq: Two times a day (BID) | ORAL | Status: DC
  Administered 2014-08-31 – 2014-09-01 (×3): 0.6 mg via ORAL
  Filled 2014-08-31 (×5): qty 1

## 2014-08-31 MED ORDER — DOCUSATE SODIUM 100 MG PO CAPS
100.0000 mg | ORAL_CAPSULE | Freq: Two times a day (BID) | ORAL | Status: DC
  Administered 2014-08-31 – 2014-09-01 (×3): 100 mg via ORAL
  Filled 2014-08-31 (×4): qty 1

## 2014-08-31 MED ORDER — BISACODYL 10 MG RE SUPP: 10.0000 mg | Freq: Every day | RECTAL | Status: DC | PRN

## 2014-08-31 MED ORDER — FOLIC ACID 1 MG PO TABS
1.0000 mg | ORAL_TABLET | Freq: Every day | ORAL | Status: DC
  Administered 2014-08-31 – 2014-09-01 (×2): 1 mg via ORAL
  Filled 2014-08-31 (×2): qty 1

## 2014-08-31 MED ORDER — LACTULOSE 10 GM/15ML PO SOLN
20.0000 g | Freq: Two times a day (BID) | ORAL | Status: DC
  Administered 2014-08-31 – 2014-09-01 (×3): 20 g via ORAL
  Filled 2014-08-31 (×5): qty 30

## 2014-08-31 MED ORDER — IPRATROPIUM-ALBUTEROL 0.5-2.5 (3) MG/3ML IN SOLN
3.0000 mL | Freq: Four times a day (QID) | RESPIRATORY_TRACT | Status: DC
  Administered 2014-08-31 (×2): 3 mL via RESPIRATORY_TRACT
  Filled 2014-08-31 (×2): qty 3

## 2014-08-31 MED ORDER — METOLAZONE 5 MG PO TABS
5.0000 mg | ORAL_TABLET | ORAL | Status: DC
Start: 2014-09-06 — End: 2014-09-01

## 2014-08-31 MED ORDER — PIPERACILLIN-TAZOBACTAM 3.375 G IVPB 30 MIN
3.3750 g | Freq: Once | INTRAVENOUS | Status: AC
  Administered 2014-08-31: 3.375 g via INTRAVENOUS
  Filled 2014-08-31: qty 50

## 2014-08-31 MED ORDER — FUROSEMIDE 80 MG PO TABS
80.0000 mg | ORAL_TABLET | Freq: Every day | ORAL | Status: DC
  Administered 2014-08-31: 80 mg via ORAL
  Filled 2014-08-31 (×2): qty 1

## 2014-08-31 MED ORDER — GABAPENTIN 300 MG PO CAPS
300.0000 mg | ORAL_CAPSULE | Freq: Two times a day (BID) | ORAL | Status: DC
  Administered 2014-08-31 – 2014-09-01 (×3): 300 mg via ORAL
  Filled 2014-08-31 (×5): qty 1

## 2014-08-31 MED ORDER — ACETAMINOPHEN 650 MG RE SUPP: 650.0000 mg | Freq: Four times a day (QID) | RECTAL | Status: DC | PRN

## 2014-08-31 MED ORDER — ADULT MULTIVITAMIN W/MINERALS CH
1.0000 | ORAL_TABLET | Freq: Every day | ORAL | Status: DC
  Administered 2014-08-31 – 2014-09-01 (×2): 1 via ORAL
  Filled 2014-08-31 (×2): qty 1

## 2014-08-31 MED ORDER — VANCOMYCIN HCL 10 G IV SOLR: 1500.0000 mg | INTRAVENOUS | Status: DC

## 2014-08-31 MED ORDER — POLYETHYLENE GLYCOL 3350 17 G PO PACK
17.0000 g | PACK | Freq: Every day | ORAL | Status: DC | PRN
  Filled 2014-08-31: qty 1

## 2014-08-31 MED ORDER — PANTOPRAZOLE SODIUM 40 MG PO TBEC
40.0000 mg | DELAYED_RELEASE_TABLET | Freq: Every day | ORAL | Status: DC
  Administered 2014-08-31 – 2014-09-01 (×2): 40 mg via ORAL
  Filled 2014-08-31: qty 1

## 2014-08-31 MED ORDER — OXYCODONE HCL 5 MG PO TABS
5.0000 mg | ORAL_TABLET | ORAL | Status: DC | PRN
  Administered 2014-08-31 – 2014-09-01 (×2): 5 mg via ORAL
  Filled 2014-08-31 (×2): qty 1

## 2014-08-31 MED ORDER — CARVEDILOL 3.125 MG PO TABS
3.1250 mg | ORAL_TABLET | Freq: Two times a day (BID) | ORAL | Status: DC
  Administered 2014-08-31 – 2014-09-01 (×3): 3.125 mg via ORAL
  Filled 2014-08-31 (×5): qty 1

## 2014-08-31 MED ORDER — INSULIN ASPART 100 UNIT/ML ~~LOC~~ SOLN
0.0000 [IU] | Freq: Three times a day (TID) | SUBCUTANEOUS | Status: DC
  Administered 2014-08-31 (×2): 3 [IU] via SUBCUTANEOUS
  Administered 2014-09-01 (×2): 2 [IU] via SUBCUTANEOUS

## 2014-08-31 MED ORDER — ACETAMINOPHEN 325 MG PO TABS: 650.0000 mg | ORAL_TABLET | Freq: Four times a day (QID) | ORAL | Status: DC | PRN

## 2014-08-31 MED ORDER — POTASSIUM CHLORIDE CRYS ER 20 MEQ PO TBCR
40.0000 meq | EXTENDED_RELEASE_TABLET | Freq: Once | ORAL | Status: AC
  Administered 2014-08-31: 40 meq via ORAL
  Filled 2014-08-31: qty 2

## 2014-08-31 MED ORDER — ALUM & MAG HYDROXIDE-SIMETH 200-200-20 MG/5ML PO SUSP: 30.0000 mL | Freq: Four times a day (QID) | ORAL | Status: DC | PRN

## 2014-08-31 MED ORDER — ZOLPIDEM TARTRATE 5 MG PO TABS
5.0000 mg | ORAL_TABLET | Freq: Every evening | ORAL | Status: DC | PRN
  Administered 2014-08-31: 5 mg via ORAL
  Filled 2014-08-31: qty 1

## 2014-08-31 MED ORDER — SODIUM CHLORIDE 0.9 % IV SOLN
2000.0000 mg | Freq: Once | INTRAVENOUS | Status: AC
  Administered 2014-08-31: 2000 mg via INTRAVENOUS
  Filled 2014-08-31: qty 2000

## 2014-08-31 MED ORDER — PIPERACILLIN-TAZOBACTAM 3.375 G IVPB
3.3750 g | Freq: Three times a day (TID) | INTRAVENOUS | Status: DC
  Filled 2014-08-31: qty 50

## 2014-08-31 MED ORDER — ALLOPURINOL 300 MG PO TABS
300.0000 mg | ORAL_TABLET | Freq: Every day | ORAL | Status: DC
  Administered 2014-08-31 – 2014-09-01 (×2): 300 mg via ORAL
  Filled 2014-08-31 (×2): qty 1

## 2014-08-31 MED ORDER — VITAMIN B-1 100 MG PO TABS
100.0000 mg | ORAL_TABLET | Freq: Every day | ORAL | Status: DC
  Administered 2014-08-31 – 2014-09-01 (×2): 100 mg via ORAL
  Filled 2014-08-31 (×2): qty 1

## 2014-08-31 MED ORDER — FUROSEMIDE 80 MG PO TABS
160.0000 mg | ORAL_TABLET | Freq: Every day | ORAL | Status: DC
  Administered 2014-08-31 – 2014-09-01 (×2): 160 mg via ORAL
  Filled 2014-08-31 (×3): qty 2

## 2014-08-31 MED ORDER — MAGNESIUM CITRATE PO SOLN: 1.0000 | Freq: Once | ORAL | Status: AC | PRN

## 2014-08-31 MED ORDER — MORPHINE SULFATE 2 MG/ML IJ SOLN
2.0000 mg | INTRAMUSCULAR | Status: DC | PRN
  Administered 2014-08-31 – 2014-09-01 (×5): 2 mg via INTRAVENOUS
  Filled 2014-08-31 (×5): qty 1

## 2014-08-31 MED ORDER — INSULIN GLARGINE 100 UNIT/ML ~~LOC~~ SOLN
12.0000 [IU] | Freq: Every day | SUBCUTANEOUS | Status: DC
  Administered 2014-08-31 (×2): 12 [IU] via SUBCUTANEOUS
  Filled 2014-08-31 (×3): qty 0.12

## 2014-08-31 MED ORDER — FLUTICASONE PROPIONATE HFA 220 MCG/ACT IN AERO
1.0000 | INHALATION_SPRAY | Freq: Two times a day (BID) | RESPIRATORY_TRACT | Status: DC
  Administered 2014-08-31: 1 via RESPIRATORY_TRACT
  Filled 2014-08-31: qty 12

## 2014-08-31 MED ORDER — MOMETASONE FURO-FORMOTEROL FUM 100-5 MCG/ACT IN AERO
2.0000 | INHALATION_SPRAY | Freq: Two times a day (BID) | RESPIRATORY_TRACT | Status: DC
  Administered 2014-08-31: 2 via RESPIRATORY_TRACT
  Filled 2014-08-31: qty 8.8

## 2014-08-31 MED ORDER — LEVOTHYROXINE SODIUM 50 MCG PO TABS
187.0000 ug | ORAL_TABLET | Freq: Every day | ORAL | Status: DC
  Administered 2014-08-31 – 2014-09-01 (×2): 187 ug via ORAL
  Filled 2014-08-31 (×3): qty 1

## 2014-08-31 MED ORDER — ONDANSETRON HCL 4 MG PO TABS: 4.0000 mg | ORAL_TABLET | Freq: Four times a day (QID) | ORAL | Status: DC | PRN

## 2014-08-31 NOTE — Progress Notes (Signed)
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 No Abscess, stop Abx Plan per [facility] All medical issues are stable  Domenic Polite, MD [phone]

## 2014-08-31 NOTE — Consult Note (Signed)
Reason for Consult:umbilical abscess, possible fistula Referring Physician: Dr Revonda Standard is an 68 y.o. male.  HPI: 68 yo morbidly obese WM with multiple medical problems (recent L cerebellar CVA in 10/15, COPD, dCHF, CAD, A fib, OSA, invasive right breast cancer) came to ED earlier tonight for drainage from umbilical incision. He underwent emergent exp lap in 01/2014 for strangulated umbilical hernia. He had small bowel resection with primary hernia repair. They state about 12 days ago he started to have discomfort at his umbilicus and spont started draining bloody fluid. He came to ED about 10 days ago and gave him abx. Over the past week, he hasn't felt as well from a GI standpoint. Reports more gas, abdominal discomfort. +BMs. No emesis.  He was supposed to have mastectomy and SLNbx but it was postponed due to recent CVA. He states he no longer drinks. Denies Tobacco  Past Medical History  Diagnosis Date  . CHRONIC OBSTRUCTIVE PULMONARY DISEASE 06/20/2009  . CAROTID STENOSIS 06/20/2009    A. 08/2001 s/p L CEA;  B.   09/14/11 - Carotid U/S - 40-59% bilateral stenosis, left CEA patch angioplasty is patent  . DM 06/20/2009  . CAD 06/20/2009    A.  08/2000 - s/p CABG x 4 - LIMA-LAD, Left Radial-OM, VG-DIAG, VG-RCA;  B. Neg. MV  2010  . HYPERLIPIDEMIA 06/20/2009  . HYPERTENSION 06/20/2009  . Hypothyroidism   . Low back pain   . Asthma     as child  . Pneumonia   . Atrial fibrillation     Not felt to be coumadin candidate 2/2 ETOH use.  Marland Kitchen ETOH abuse     no alcohol since Oct. 2015  . History of tobacco abuse     remote - quit 1970  . Bilateral renal cysts   . Marijuana abuse   . Morbidly obese   . CHF (congestive heart failure)   . Falls frequently   . Hx of cardiovascular stress test 05/2014    Lexiscan Myoview (8/15):  No ischemia; EF 63% - Normal Study  . Breast cancer, right breast   . Gout   . OBSTRUCTIVE SLEEP APNEA 06/20/2009    does not use cpap  . Stroke     found on MRI  in Oct. 2015.   Marland Kitchen Shortness of breath dyspnea     occasional  . Depression     takes Prozac  . Kidney disease     Stage 3  . GERD (gastroesophageal reflux disease)     takes Prilosec  . Arthritis   . Neuromuscular disorder     neuropathy in both legs  . Constipation     Past Surgical History  Procedure Laterality Date  . Carotid endarterectomy  2002    left  . Coronary artery bypass graft      x 4 - 2001  . Umbilical hernia repair N/A 01/28/2014    Procedure: HERNIA REPAIR UMBILICAL ADULT/INC;  Surgeon: Harl Bowie, MD;  Location: [place];  Service: General;  Laterality: N/A;  . Laparotomy N/A 01/28/2014    Procedure: EXPLORATORY LAPAROTOMY;  Surgeon: Harl Bowie, MD;  Location: [place];  Service: General;  Laterality: N/A;  . Bowel resection N/A 01/28/2014    Procedure: SMALL BOWEL RESECTION;  Surgeon: Harl Bowie, MD;  Location: [place];  Service: General;  Laterality: N/A;  . Hernia repair    . Colonoscopy      Family History  Problem Relation Age of Onset  .  Stroke Mother     ?  Marland Kitchen Cancer Mother     unknown type of cancer  . Stroke Father     ?  Marland Kitchen Heart attack Neg Hx   . Cancer Brother 80    stomach cancer    Social History:  reports that he quit smoking about 45 years ago. He has never used smokeless tobacco. He reports that he drinks alcohol. He reports that he uses illicit drugs (Marijuana).  Allergies:  Allergies  Allergen Reactions  . Erythromycin Hives    Medications: I have reviewed the patient's current medications.  Results for orders placed or performed during the hospital encounter of 08/30/14 (from the past 48 hour(s))  CBC with Differential     Status: Abnormal   Collection Time: 08/30/14  7:19 PM  Result Value Ref Range   WBC 6.4 4.0 - 10.5 K/uL   RBC 3.86 (L) 4.22 - 5.81 MIL/uL   Hemoglobin 11.8 (L) 13.0 - 17.0 g/dL   HCT 36.4 (L) 39.0 - 52.0 %   MCV 94.3 78.0 - 100.0 fL   MCH 30.6 26.0 - 34.0 pg   MCHC 32.4 30.0 - 36.0  g/dL   RDW 17.9 (H) 11.5 - 15.5 %   Platelets 136 (L) 150 - 400 K/uL   Neutrophils Relative % 58 43 - 77 %   Neutro Abs 3.7 1.7 - 7.7 K/uL   Lymphocytes Relative 22 12 - 46 %   Lymphs Abs 1.4 0.7 - 4.0 K/uL   Monocytes Relative 9 3 - 12 %   Monocytes Absolute 0.6 0.1 - 1.0 K/uL   Eosinophils Relative 11 (H) 0 - 5 %   Eosinophils Absolute 0.7 0.0 - 0.7 K/uL   Basophils Relative 0 0 - 1 %   Basophils Absolute 0.0 0.0 - 0.1 K/uL  Comprehensive metabolic panel     Status: Abnormal   Collection Time: 08/30/14  7:19 PM  Result Value Ref Range   Sodium 137 137 - 147 mEq/L   Potassium 3.4 (L) 3.7 - 5.3 mEq/L   Chloride 92 (L) 96 - 112 mEq/L   CO2 31 19 - 32 mEq/L   Glucose, Bld 174 (H) 70 - 99 mg/dL   BUN 69 (H) 6 - 23 mg/dL   Creatinine, Ser 1.56 (H) 0.50 - 1.35 mg/dL   Calcium 9.6 8.4 - 10.5 mg/dL   Total Protein 7.3 6.0 - 8.3 g/dL   Albumin 3.2 (L) 3.5 - 5.2 g/dL   AST 23 0 - 37 U/L   ALT 13 0 - 53 U/L   Alkaline Phosphatase 80 39 - 117 U/L   Total Bilirubin 0.6 0.3 - 1.2 mg/dL   GFR calc non Af Amer 43 (L) >90 mL/min   GFR calc Af Amer 50 (L) >90 mL/min    Comment: (NOTE) The eGFR has been calculated using the CKD EPI equation. This calculation has not been validated in all clinical situations. eGFR's persistently <90 mL/min signify possible Chronic Kidney Disease.    Anion gap 14 5 - 15    Ct Abdomen Pelvis W Contrast  08/30/2014   CLINICAL DATA:  Erythema, swelling and drainage from the umbilicus for several days. Antibiotic therapy completed today. History of diabetes and substance abuse. History of hernia repair and bowel resection in April 2015. Initial encounter.  EXAM: CT ABDOMEN AND PELVIS WITH CONTRAST  TECHNIQUE: Multidetector CT imaging of the abdomen and pelvis was performed using the standard protocol following bolus administration of intravenous contrast.  CONTRAST:  14m OMNIPAQUE IOHEXOL 300 MG/ML  SOLN  COMPARISON:  Abdominal pelvic CT 01/27/2014.  FINDINGS:  Lower chest: Patchy atelectasis or scarring in both lung bases is similar to the prior examination. There is no significant pleural or pericardial effusion. There is extensive atherosclerosis of the coronary arteries status post median sternotomy and CABG. There is a partially calcified 1.9 cm nodule within the right breast on image number 4, not previously imaged.  Hepatobiliary: The liver is normal in density without focal abnormality. No evidence of gallstones, gallbladder wall thickening or biliary dilatation.  Pancreas: Stable atrophy without focal abnormality. No pancreatic ductal dilatation.  Spleen: Normal in size without focal abnormality.  Adrenals/Urinary Tract: Both adrenal glands appear normal.The kidneys appear stable with perinephric soft tissue stranding and multiple low density lesions bilaterally. There is no hydronephrosis or significant delay in contrast excretion. Mild bladder wall thickening is similar to the prior study.  Stomach/Bowel: Thickening of the proximal stomach is likely due to incomplete distention. There is no small or large bowel wall thickening. Right lower quadrant small bowel anastomosis appears patent. As noted below, these postsurgical changes abut the anterior abdominal wall and umbilicus. There is no evidence of bowel obstruction or extraluminal fluid collection.  Vascular/Lymphatic: Small retroperitoneal and pelvic lymph nodes are stable there is grossly stable diffuse atherosclerosis of the aorta, its branches and iliac arteries.  Reproductive: Stable.  Other: There are postsurgical changes related to interval umbilical hernia repair. Within the umbilicus, there is a tubular soft tissue structure measuring up to 4.6 x 2.1 cm. There is no air or contrast material within the anterior abdominal wall, although the contrast has not yet passed through the anastomosis. This umbilical process abuts the anastomosis clips in the right lower quadrant.  Musculoskeletal: No acute or  significant osseous findings. There are extensive degenerative changes throughout the spine.  IMPRESSION: 1. Interval umbilical hernia repair and partial small bowel resection. There is tubular soft tissue within the anterior abdominal wall which abuts the postsurgical changes within the peritoneal cavity. 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 2. No evidence of intra-abdominal abscess or bowel obstruction. 3. Stable bilateral low density renal lesions. 4. Grossly stable diffuse atherosclerosis. 5. Indeterminate partially calcified nodule in the right breast. Patient did undergo right breast biopsy on 05/16/2014.   Electronically Signed   By: BCamie PatienceM.D.   On: 08/30/2014 21:40    Review of Systems  Constitutional: Negative for fever, chills and weight loss.  HENT: Negative for nosebleeds.   Eyes: Negative for blurred vision.  Respiratory: Positive for shortness of breath (with activity).        +OSA but doesn't use CPAP  Cardiovascular: Positive for leg swelling. Negative for chest pain, palpitations, orthopnea and PND.       + DOE  Gastrointestinal: Positive for nausea and abdominal pain. Negative for vomiting, diarrhea, blood in stool and melena.  Genitourinary: Negative for dysuria and hematuria.  Musculoskeletal: Positive for joint pain.  Skin: Negative for itching and rash.  Neurological: Positive for dizziness. Negative for focal weakness, seizures, loss of consciousness and headaches.       Denies TIAs, amaurosis fugax; had recent L cerebellar CVA 07/2014  Endo/Heme/Allergies: Does not bruise/bleed easily.  Psychiatric/Behavioral: The patient is not nervous/anxious.        Used to drink; no etoh for a month or two   Blood pressure 124/83, pulse 68, temperature 97.3 F (36.3 C), temperature source Oral,  resp. rate 17, height 5' 9.5" (1.765 m), weight 232 lb (105.235 kg), SpO2 98 %. Physical Exam  Vitals  reviewed. Constitutional: He is oriented to person, place, and time.  Morbidly obese WM, nontoxic; just appears chronically ill  HENT:  Head: Normocephalic and atraumatic.  Right Ear: External ear normal.  Left Ear: External ear normal.  Eyes: Conjunctivae are normal. No scleral icterus.  Neck: Normal range of motion. No tracheal deviation present.  Cardiovascular: Normal rate.   Respiratory: Effort normal and breath sounds normal. No stridor. No respiratory distress. He has no wheezes. He has no rales.  GI: Soft. He exhibits no distension. There is no rebound and no guarding.  Mini midline incision, at base of umbilicus has a small lump of pink tissue that will spont drain thin yellow fluid in press in surrounding area, 1-2 visible surgical staples on this lump of tissue. See pic. No skin breakdown or cellulitis in area.   Musculoskeletal: He exhibits no tenderness. Edema: trace edema BLE.  Neurological: He is alert and oriented to person, place, and time.  Skin: Skin is warm and dry. There is erythema (has some mild cellulitis of b/l LE along shins).      Assessment/Plan: Enterocutaneous Fistula Protein calorie malnutrition DM 2 Morbid obesity Elevated creatinine Chronic diastolic heart failure OSA HTN COPD Right Breast Cancer Recent cerebellar CVA  Based on physical exam (lump of pink tissue with surgical anastomosis linear staple) and CT scan this is most c/w an enterocutaneous fistula. Fortunately there is no sign of systemic illness. There is no skin breakdown. There is no intra-abdominal abscess  rec admit to medicine given numerous medical issues Doesn't need abx for EC Fistula Would recommend trying to pouch fistula Bowel rest for now.  VTE prophylaxis  Very complex situation given associated medical issues. Will have to discuss with rest of team  Leighton Ruff. Redmond Pulling, MD, FACS General, Bariatric, & Minimally Invasive Surgery [facility],  [place]   [facility] M 08/31/2014, 1:53 AM

## 2014-08-31 NOTE — Progress Notes (Signed)
Subjective: Up in chair, feels better  Objective: Vital signs in last 24 hours: Temp:  [97.3 F (36.3 C)-98.8 F (37.1 C)] 98.8 F (37.1 C) (11/21 0946) Pulse Rate:  [57-88] 88 (11/21 0946) Resp:  [12-23] 17 (11/21 0946) BP: (120-147)/(58-83) 141/67 mmHg (11/21 0946) SpO2:  [93 %-100 %] 100 % (11/21 0946) Weight:  [232 lb (105.235 kg)-234 lb 6.4 oz (106.323 kg)] 234 lb 6.4 oz (106.323 kg) (11/21 0159) Last BM Date: 08/29/14  Intake/Output from previous day: 11/20 0701 - 11/21 0700 In: 500 [IV Piggyback:500] Out: 1500 [Urine:1500] Intake/Output this shift: Total I/O In: 240 [P.O.:240] Out: -   General appearance: alert and cooperative Resp: clear to auscultation bilaterally Breasts: R side mass under nipple 4cm GI: soft, NT, small bud of granulation and small enteric output  Lab Results:   Recent Labs  08/30/14 1919 08/31/14 0540  WBC 6.4 6.4  HGB 11.8* 11.1*  HCT 36.4* 35.0*  PLT 136* 127*   BMET  Recent Labs  08/30/14 1919 08/31/14 0540  NA 137 140  K 3.4* 3.3*  CL 92* 93*  CO2 31 31  GLUCOSE 174* 168*  BUN 69* 65*  CREATININE 1.56* 1.31  CALCIUM 9.6 9.3   PT/INR No results for input(s): LABPROT, INR in the last 72 hours. ABG No results for input(s): PHART, HCO3 in the last 72 hours.  Invalid input(s): PCO2, PO2  Studies/Results: Ct Abdomen Pelvis W Contrast  08/30/2014   CLINICAL DATA:  Erythema, swelling and drainage from the umbilicus for several days. Antibiotic therapy completed today. History of diabetes and substance abuse. History of hernia repair and bowel resection in April 2015. Initial encounter.  EXAM: CT ABDOMEN AND PELVIS WITH CONTRAST  TECHNIQUE: Multidetector CT imaging of the abdomen and pelvis was performed using the standard protocol following bolus administration of intravenous contrast.  CONTRAST:  21mL OMNIPAQUE IOHEXOL 300 MG/ML  SOLN  COMPARISON:  Abdominal pelvic CT 01/27/2014.  FINDINGS: Lower chest: Patchy atelectasis  or scarring in both lung bases is similar to the prior examination. There is no significant pleural or pericardial effusion. There is extensive atherosclerosis of the coronary arteries status post median sternotomy and CABG. There is a partially calcified 1.9 cm nodule within the right breast on image number 4, not previously imaged.  Hepatobiliary: The liver is normal in density without focal abnormality. No evidence of gallstones, gallbladder wall thickening or biliary dilatation.  Pancreas: Stable atrophy without focal abnormality. No pancreatic ductal dilatation.  Spleen: Normal in size without focal abnormality.  Adrenals/Urinary Tract: Both adrenal glands appear normal.The kidneys appear stable with perinephric soft tissue stranding and multiple low density lesions bilaterally. There is no hydronephrosis or significant delay in contrast excretion. Mild bladder wall thickening is similar to the prior study.  Stomach/Bowel: Thickening of the proximal stomach is likely due to incomplete distention. There is no small or large bowel wall thickening. Right lower quadrant small bowel anastomosis appears patent. As noted below, these postsurgical changes abut the anterior abdominal wall and umbilicus. There is no evidence of bowel obstruction or extraluminal fluid collection.  Vascular/Lymphatic: Small retroperitoneal and pelvic lymph nodes are stable there is grossly stable diffuse atherosclerosis of the aorta, its branches and iliac arteries.  Reproductive: Stable.  Other: There are postsurgical changes related to interval umbilical hernia repair. Within the umbilicus, there is a tubular soft tissue structure measuring up to 4.6 x 2.1 cm. There is no air or contrast material within the anterior abdominal wall, although the contrast has  not yet passed through the anastomosis. This umbilical process abuts the anastomosis clips in the right lower quadrant.  Musculoskeletal: No acute or significant osseous findings.  There are extensive degenerative changes throughout the spine.  IMPRESSION: 1. Interval umbilical hernia repair and partial small bowel resection. There is tubular soft tissue within the anterior abdominal wall which abuts the postsurgical changes within the peritoneal cavity. 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 2. No evidence of intra-abdominal abscess or bowel obstruction. 3. Stable bilateral low density renal lesions. 4. Grossly stable diffuse atherosclerosis. 5. Indeterminate partially calcified nodule in the right breast. Patient did undergo right breast biopsy on 05/16/2014.   Electronically Signed   By: Camie Patience M.D.   On: 08/30/2014 21:40    Anti-infectives: Anti-infectives    Start     Dose/Rate Route Frequency Ordered Stop   09/01/14 0000  vancomycin (VANCOCIN) 1,500 mg in sodium chloride 0.9 % 500 mL IVPB  Status:  Discontinued     1,500 mg250 mL/hr over 120 Minutes Intravenous Every 24 hours 08/31/14 0108 08/31/14 0812   08/31/14 0800  piperacillin-tazobactam (ZOSYN) IVPB 3.375 g  Status:  Discontinued     3.375 g12.5 mL/hr over 240 Minutes Intravenous Every 8 hours 08/31/14 0108 08/31/14 0812   08/31/14 0115  vancomycin (VANCOCIN) 2,000 mg in sodium chloride 0.9 % 500 mL IVPB     2,000 mg250 mL/hr over 120 Minutes Intravenous  Once 08/31/14 0108 08/31/14 0548   08/31/14 0115  piperacillin-tazobactam (ZOSYN) IVPB 3.375 g     3.375 g100 mL/hr over 30 Minutes Intravenous  Once 08/31/14 0108 08/31/14 [id number]      Assessment/Plan: EC fistulaB - very low output, no abscess, no ABX needed, wound care BR breast CA - anesthesia has delayed due to recent CA, will investigate staged procedure with block to allow removal of primary for now  LOS: 1 day    Kristyn Obyrne E 08/31/2014

## 2014-08-31 NOTE — H&P (Addendum)
[facility] History and Physical  [name] KDT:[id number] DOB: 09/13/1946 DOA: 08/30/2014  Referring physician: Elnora Morrison, MD PCP: Jene Every, MD   Chief Complaint: Cellulitis of the abdomen  HPI: [name] is a 68 y.o. male presents with umbilical area cellulitis. Patient states that he had surgery in April. He had done fairly well until recently when he started noticing a discharge and drainage from his anterior abdominal wall. He states this started about a week ago. He states he has no fever noted. He has some abdominal pain noted. He has no vomiting and no diarrhea. He states that he is tender to the touch. He does not note any foul smell from the wound area. He had presented earlier this month with similar complaints was discharged with follow up with his PCP. Patient had a CT scan of the abdomen done and this shows a possible fistula. ED called surgery and they suggested a medical admission and they would follow on consult.   Review of Systems:  Constitutional:  No weight loss, night sweats, Fevers, chills HEENT:  No headaches Cardio-vascular:  No chest pain, Orthopnea, PND, ++swelling in lower extremities GI:  No heartburn, indigestion, ++abdominal pain, no nausea, vomiting, diarrhea  Resp:  ++shortness of breath with exertion or at rest. No excess mucus, no productive cough, No non-productive cough, No coughing up of blood Skin:  no rash or lesions.  GU:  no dysuria, change in color of urine, no urgency or frequency Musculoskeletal:  No joint pain or swelling. No decreased range of motion Psych:  No change in mood or affect. No depression or anxiety  Past Medical History  Diagnosis Date  . CHRONIC OBSTRUCTIVE PULMONARY DISEASE 06/20/2009  . CAROTID STENOSIS 06/20/2009    A. 08/2001 s/p L CEA;  B.   09/14/11 - Carotid U/S - 40-59% bilateral stenosis, left CEA patch angioplasty is patent  . DM 06/20/2009  . CAD 06/20/2009    A.  08/2000 - s/p CABG x  4 - LIMA-LAD, Left Radial-OM, VG-DIAG, VG-RCA;  B. Neg. MV  2010  . HYPERLIPIDEMIA 06/20/2009  . HYPERTENSION 06/20/2009  . Hypothyroidism   . Low back pain   . Asthma     as child  . Pneumonia   . Atrial fibrillation     Not felt to be coumadin candidate 2/2 ETOH use.  Marland Kitchen ETOH abuse     no alcohol since Oct. 2015  . History of tobacco abuse     remote - quit 1970  . Bilateral renal cysts   . Marijuana abuse   . Morbidly obese   . CHF (congestive heart failure)   . Falls frequently   . Hx of cardiovascular stress test 05/2014    Lexiscan Myoview (8/15):  No ischemia; EF 63% - Normal Study  . Breast cancer, right breast   . Gout   . OBSTRUCTIVE SLEEP APNEA 06/20/2009    does not use cpap  . Stroke     found on MRI in Oct. 2015.   Marland Kitchen Shortness of breath dyspnea     occasional  . Depression     takes Prozac  . Kidney disease     Stage 3  . GERD (gastroesophageal reflux disease)     takes Prilosec  . Arthritis   . Neuromuscular disorder     neuropathy in both legs  . Constipation    Past Surgical History  Procedure Laterality Date  . Carotid endarterectomy  2002    left  .  Coronary artery bypass graft      x 4 - 2001  . Umbilical hernia repair N/A 01/28/2014    Procedure: HERNIA REPAIR UMBILICAL ADULT/INC;  Surgeon: Harl Bowie, MD;  Location: [place];  Service: General;  Laterality: N/A;  . Laparotomy N/A 01/28/2014    Procedure: EXPLORATORY LAPAROTOMY;  Surgeon: Harl Bowie, MD;  Location: [place];  Service: General;  Laterality: N/A;  . Bowel resection N/A 01/28/2014    Procedure: SMALL BOWEL RESECTION;  Surgeon: Harl Bowie, MD;  Location: [place];  Service: General;  Laterality: N/A;  . Hernia repair    . Colonoscopy     Social History:  reports that he quit smoking about 45 years ago. He has never used smokeless tobacco. He reports that he drinks alcohol. He reports that he uses illicit drugs (Marijuana).  Allergies  Allergen Reactions  .  Erythromycin Hives    Family History  Problem Relation Age of Onset  . Stroke Mother     ?  Marland Kitchen Cancer Mother     unknown type of cancer  . Stroke Father     ?  Marland Kitchen Heart attack Neg Hx   . Cancer Brother 50    stomach cancer     Prior to Admission medications   Medication Sig Start Date End Date Taking? Authorizing Provider  allopurinol (ZYLOPRIM) 300 MG tablet Take 1 tablet (300 mg total) by mouth daily. 08/02/14  Yes Daniel J Angiulli, PA-C  aspirin EC 325 MG EC tablet Take 1 tablet (325 mg total) by mouth daily. 08/02/14  Yes Daniel J Angiulli, PA-C  atorvastatin (LIPITOR) 20 MG tablet Take 1 tablet (20 mg total) by mouth daily. 08/02/14  Yes Daniel J Angiulli, PA-C  carvedilol (COREG) 3.125 MG tablet Take 1 tablet (3.125 mg total) by mouth 2 (two) times daily with a meal. 08/02/14  Yes Daniel J Angiulli, PA-C  colchicine 0.6 MG tablet Take 1 tablet (0.6 mg total) by mouth 2 (two) times daily. 08/02/14  Yes Daniel J Angiulli, PA-C  FLUoxetine (PROZAC) 20 MG capsule Take 1 capsule (20 mg total) by mouth daily. 08/02/14  Yes Daniel J Angiulli, PA-C  Fluticasone Furoate 200 MCG/ACT AEPB Inhale 1 puff into the lungs 2 (two) times daily. 08/02/14  Yes Daniel J Angiulli, PA-C  folic acid (FOLVITE) 1 MG tablet Take 1 tablet (1 mg total) by mouth daily. 08/02/14  Yes Daniel J Angiulli, PA-C  furosemide (LASIX) 80 MG tablet Take 2 tablets in the AM and 1 in the PM Patient taking differently: Take 160 mg by mouth See admin instructions. Take to tablets in the morning and 1 tablet in the afternoon per daughter 08/29/14  Yes Burnell Blanks, MD  gabapentin (NEURONTIN) 300 MG capsule Take 1 capsule (300 mg total) by mouth 2 (two) times daily. 08/02/14  Yes Daniel J Angiulli, PA-C  insulin glargine (LANTUS) 100 UNIT/ML injection Inject 0.08 mLs (8 Units total) into the skin at bedtime. Patient taking differently: Inject 12 Units into the skin at bedtime.  08/02/14  Yes Daniel J Angiulli, PA-C    lactulose (CHRONULAC) 10 GM/15ML solution Take 30 mLs (20 g total) by mouth 2 (two) times daily. 08/02/14  Yes Daniel J Angiulli, PA-C  levothyroxine (SYNTHROID, LEVOTHROID) 125 MCG tablet Take 1.5 tablets (187 mcg total) by mouth daily before breakfast. 08/02/14  Yes Daniel J Angiulli, PA-C  metolazone (ZAROXOLYN) 5 MG tablet Take 1 tablet (5 mg total) by mouth once a week. Take  on Friday  and as needed. Take with an extra 20 meq of potassium Patient taking differently: Take 5 mg by mouth once a week. On Friday 08/08/14  Yes Amy D Clegg, NP  Multiple Vitamin (MULTIVITAMIN WITH MINERALS) TABS tablet Take 1 tablet by mouth daily.   Yes Historical Provider, MD  omeprazole (PRILOSEC) 20 MG capsule Take 1 capsule (20 mg total) by mouth daily. 08/02/14  Yes Daniel J Angiulli, PA-C  oxyCODONE 10 MG TABS Take 1 tablet (10 mg total) by mouth every 6 (six) hours as needed for severe pain. 08/02/14  Yes Daniel J Angiulli, PA-C  potassium chloride SA (K-DUR,KLOR-CON) 20 MEQ tablet Take 1 tablet (20 mEq total) by mouth daily. Patient taking differently: Take 20-40 mEq by mouth daily. Take 2 tablets on Friday then take 1 tablet all other days 08/02/14  Yes Daniel J Angiulli, PA-C  amoxicillin-clavulanate (AUGMENTIN) 875-125 MG per tablet Take 1 tablet by mouth 2 (two) times daily. 08/20/14   Kalman Drape, MD  lactulose (CHRONULAC) 10 GM/15ML solution Take 30 mLs (20 g total) by mouth 2 (two) times daily. Patient not taking: Reported on 08/30/2014 08/02/14   Cathlyn Parsons, PA-C   Physical Exam: Filed Vitals:   08/30/14 1909 08/30/14 2000 08/30/14 2300 08/30/14 2350  BP: 130/58 144/79 140/69 127/63  Pulse: 63 63 57 75  Temp: 97.3 F (36.3 C)     TempSrc: Oral     Resp: 12 18 23 18   Height: 5' 9.5" (1.765 m)     Weight: 105.235 kg (232 lb)     SpO2: 97% 99% 100% 99%    Wt Readings from Last 3 Encounters:  08/30/14 105.235 kg (232 lb)  08/29/14 107.049 kg (236 lb)  08/08/14 109.884 kg (242 lb 4  oz)    General:  Appears calm and comfortable Eyes: PERRL, normal lids, irises & conjunctiva ENT: grossly normal hearing, lips & tongue Neck: no LAD, masses or thyromegaly Cardiovascular: RRR, no m/r/g. ++LE edema. Respiratory: CTA bilaterally, no w/r/r. Normal respiratory effort. Abdomen: soft, has bandage over anterior wall noted some drainage and area of erythema and cellulitis around left side of umbilical area Skin: Chronic vascular changes noted on bilateral LE Musculoskeletal: grossly normal tone BUE/BLE Psychiatric: grossly normal mood and affect, speech fluent and appropriate Neurologic: grossly non-focal          Labs on Admission:  Basic Metabolic Panel:  Recent Labs Lab 08/29/14 1436 08/30/14 1919  NA 134* 137  K 3.1* 3.4*  CL 87* 92*  CO2 34* 31  GLUCOSE 190* 174*  BUN 76* 69*  CREATININE 1.7* 1.56*  CALCIUM 9.1 9.6   Liver Function Tests:  Recent Labs Lab 08/30/14 1919  AST 23  ALT 13  ALKPHOS 80  BILITOT 0.6  PROT 7.3  ALBUMIN 3.2*   No results for input(s): LIPASE, AMYLASE in the last 168 hours. No results for input(s): AMMONIA in the last 168 hours. CBC:  Recent Labs Lab 08/30/14 1919  WBC 6.4  NEUTROABS 3.7  HGB 11.8*  HCT 36.4*  MCV 94.3  PLT 136*   Cardiac Enzymes: No results for input(s): CKTOTAL, CKMB, CKMBINDEX, TROPONINI in the last 168 hours.  BNP (last 3 results)  Recent Labs  04/20/14 1655 06/11/14 1624 07/21/14 1300  PROBNP 17870.0* 4440.0* 3542.0*   CBG: No results for input(s): GLUCAP in the last 168 hours.  Radiological Exams on Admission: Ct Abdomen Pelvis W Contrast  08/30/2014   CLINICAL DATA:  Erythema, swelling  and drainage from the umbilicus for several days. Antibiotic therapy completed today. History of diabetes and substance abuse. History of hernia repair and bowel resection in April 2015. Initial encounter.  EXAM: CT ABDOMEN AND PELVIS WITH CONTRAST  TECHNIQUE: Multidetector CT imaging of the  abdomen and pelvis was performed using the standard protocol following bolus administration of intravenous contrast.  CONTRAST:  77mL OMNIPAQUE IOHEXOL 300 MG/ML  SOLN  COMPARISON:  Abdominal pelvic CT 01/27/2014.  FINDINGS: Lower chest: Patchy atelectasis or scarring in both lung bases is similar to the prior examination. There is no significant pleural or pericardial effusion. There is extensive atherosclerosis of the coronary arteries status post median sternotomy and CABG. There is a partially calcified 1.9 cm nodule within the right breast on image number 4, not previously imaged.  Hepatobiliary: The liver is normal in density without focal abnormality. No evidence of gallstones, gallbladder wall thickening or biliary dilatation.  Pancreas: Stable atrophy without focal abnormality. No pancreatic ductal dilatation.  Spleen: Normal in size without focal abnormality.  Adrenals/Urinary Tract: Both adrenal glands appear normal.The kidneys appear stable with perinephric soft tissue stranding and multiple low density lesions bilaterally. There is no hydronephrosis or significant delay in contrast excretion. Mild bladder wall thickening is similar to the prior study.  Stomach/Bowel: Thickening of the proximal stomach is likely due to incomplete distention. There is no small or large bowel wall thickening. Right lower quadrant small bowel anastomosis appears patent. As noted below, these postsurgical changes abut the anterior abdominal wall and umbilicus. There is no evidence of bowel obstruction or extraluminal fluid collection.  Vascular/Lymphatic: Small retroperitoneal and pelvic lymph nodes are stable there is grossly stable diffuse atherosclerosis of the aorta, its branches and iliac arteries.  Reproductive: Stable.  Other: There are postsurgical changes related to interval umbilical hernia repair. Within the umbilicus, there is a tubular soft tissue structure measuring up to 4.6 x 2.1 cm. There is no air or  contrast material within the anterior abdominal wall, although the contrast has not yet passed through the anastomosis. This umbilical process abuts the anastomosis clips in the right lower quadrant.  Musculoskeletal: No acute or significant osseous findings. There are extensive degenerative changes throughout the spine.  IMPRESSION: 1. Interval umbilical hernia repair and partial small bowel resection. There is tubular soft tissue within the anterior abdominal wall which abuts the postsurgical changes within the peritoneal cavity. 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 2. No evidence of intra-abdominal abscess or bowel obstruction. 3. Stable bilateral low density renal lesions. 4. Grossly stable diffuse atherosclerosis. 5. Indeterminate partially calcified nodule in the right breast. Patient did undergo right breast biopsy on 05/16/2014.   Electronically Signed   By: Camie Patience M.D.   On: 08/30/2014 21:40      Assessment/Plan Active Problems:   DM (diabetes mellitus) type II uncontrolled, periph vascular disorder   Obstructive sleep apnea   Diabetes mellitus   Hypertension   COPD (chronic obstructive pulmonary disease)   Cellulitis of umbilicus   1. 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 Cellulitis of abdominal wall -will start on antibiotics -Zosyn and Vancocin should be adequate  3. COPD -will continue with oxygen therapy -continue with inhalers  4. HTN -currnelty stable will continue with home medications -will monitor pressures  5. Diabetes Mellitus -continue with insulin -check A1C -monitor finger sticks  6. Thrombocytopenia -will monitor platelets  7. CKD -creatinine around baseline levels -will monitor labs  8. Sleep apnea -by history  -states he does not use CPAP  Code Status: Full Code (must indicate code status--if  unknown or must be presumed, indicate so) DVT Prophylaxis:Heparin Family Communication: None (indicate person spoken with, if applicable, with phone number if by telephone) Disposition Plan: Home (indicate anticipated LOS)  Time spent: 59min  KHAN,SAADAT A [facility] Pager [phone]

## 2014-08-31 NOTE — Progress Notes (Signed)
ANTIBIOTIC CONSULT NOTE - INITIAL  Pharmacy Consult for Vancocin and Zosyn Indication: wound infection  Allergies  Allergen Reactions  . Erythromycin Hives    Patient Measurements: Height: 5' 9.5" (176.5 cm) Weight: 232 lb (105.235 kg) IBW/kg (Calculated) : 71.85  Vital Signs: Temp: 97.3 F (36.3 C) (11/20 1909) Temp Source: Oral (11/20 1909) BP: 120/76 mmHg (11/21 0020) Pulse Rate: 70 (11/21 0020)  Labs:  Recent Labs  08/29/14 1436 08/30/14 1919  WBC  --  6.4  HGB  --  11.8*  PLT  --  136*  CREATININE 1.7* 1.56*   Estimated Creatinine Clearance: 53.1 mL/min (by C-G formula based on Cr of 1.56).   Microbiology: Recent Results (from the past 720 hour(s))  Culture, routine-abscess     Status: None   Collection Time: 08/20/14  3:38 AM  Result Value Ref Range Status   Specimen Description ABSCESS UMBILICUS  Final   Special Requests NONE  Final   Gram Stain   Final    MODERATE WBC PRESENT,BOTH PMN AND MONONUCLEAR NO SQUAMOUS EPITHELIAL CELLS SEEN NO ORGANISMS SEEN Performed at [facility]    Culture   Final    MULTIPLE ORGANISMS PRESENT, NONE PREDOMINANT Note: NO STAPHYLOCOCCUS AUREUS ISOLATED NO GROUP A STREP (S.PYOGENES) ISOLATED Performed at [facility]    Report Status 08/23/2014 FINAL  Final    Medical History: Past Medical History  Diagnosis Date  . CHRONIC OBSTRUCTIVE PULMONARY DISEASE 06/20/2009  . CAROTID STENOSIS 06/20/2009    A. 08/2001 s/p L CEA;  B.   09/14/11 - Carotid U/S - 40-59% bilateral stenosis, left CEA patch angioplasty is patent  . DM 06/20/2009  . CAD 06/20/2009    A.  08/2000 - s/p CABG x 4 - LIMA-LAD, Left Radial-OM, VG-DIAG, VG-RCA;  B. Neg. MV  2010  . HYPERLIPIDEMIA 06/20/2009  . HYPERTENSION 06/20/2009  . Hypothyroidism   . Low back pain   . Asthma     as child  . Pneumonia   . Atrial fibrillation     Not felt to be coumadin candidate 2/2 ETOH use.  Marland Kitchen ETOH abuse     no alcohol since Oct. 2015  . History of  tobacco abuse     remote - quit 1970  . Bilateral renal cysts   . Marijuana abuse   . Morbidly obese   . CHF (congestive heart failure)   . Falls frequently   . Hx of cardiovascular stress test 05/2014    Lexiscan Myoview (8/15):  No ischemia; EF 63% - Normal Study  . Breast cancer, right breast   . Gout   . OBSTRUCTIVE SLEEP APNEA 06/20/2009    does not use cpap  . Stroke     found on MRI in Oct. 2015.   Marland Kitchen Shortness of breath dyspnea     occasional  . Depression     takes Prozac  . Kidney disease     Stage 3  . GERD (gastroesophageal reflux disease)     takes Prilosec  . Arthritis   . Neuromuscular disorder     neuropathy in both legs  . Constipation     Assessment: 68yo male was on oral ABX (Augmentin) for umbilical wound/cellulitis, completed ABX today, w/ continued drainage and redness/swelling, pt reports no improvement since ABX, CT reveals possible fistula, to be admitted for surgical consult, to begin IV ABX,  Goal of Therapy:  Vancomycin trough level 10-15 mcg/ml  Plan:  Will give vancomycin 2000mg  IV x1 followed  by 1500mg  IV Q24H and Zosyn 3.375g IV Q8H and monitor CBC, Cx, levels prn.  Wynona Neat, PharmD, BCPS  08/31/2014,12:47 AM

## 2014-08-31 NOTE — ED Notes (Signed)
Report attempted 

## 2014-09-01 DIAGNOSIS — J449 Chronic obstructive pulmonary disease, unspecified: Secondary | ICD-10-CM

## 2014-09-01 DIAGNOSIS — E1159 Type 2 diabetes mellitus with other circulatory complications: Secondary | ICD-10-CM

## 2014-09-01 DIAGNOSIS — K632 Fistula of intestine: Secondary | ICD-10-CM | POA: Insufficient documentation

## 2014-09-01 LAB — GLUCOSE, CAPILLARY
GLUCOSE-CAPILLARY: 122 mg/dL — AB (ref 70–99)
GLUCOSE-CAPILLARY: 135 mg/dL — AB (ref 70–99)
Glucose-Capillary: 135 mg/dL — ABNORMAL HIGH (ref 70–99)

## 2014-09-01 MED ORDER — IPRATROPIUM-ALBUTEROL 0.5-2.5 (3) MG/3ML IN SOLN
3.0000 mL | Freq: Three times a day (TID) | RESPIRATORY_TRACT | Status: DC
  Administered 2014-09-01 (×2): 3 mL via RESPIRATORY_TRACT
  Filled 2014-09-01: qty 3

## 2014-09-01 MED ORDER — IPRATROPIUM-ALBUTEROL 0.5-2.5 (3) MG/3ML IN SOLN: 3.0000 mL | RESPIRATORY_TRACT | Status: DC | PRN

## 2014-09-01 NOTE — Discharge Summary (Signed)
Physician Discharge Summary  [name] QIH:[id number] DOB: [birth year] DOA: 08/30/2014  PCP: Jene Every, MD  Admit date: 08/30/2014 Discharge date: 09/01/2014  Time spent: 45 minutes  Recommendations for Outpatient Follow-up:  1. Dr.Hoxworth with [facility] in 2 weeks  Discharge Diagnoses:    Enterocutaneous fistula   CKD 3   DM (diabetes mellitus) type II uncontrolled, periph vascular disorder   Obstructive sleep apnea   Diabetes mellitus   Hypertension   COPD (chronic obstructive pulmonary disease)   H/o CVA  Discharge Condition: stable  Diet recommendation: heart healthy diabetic  Filed Weights   08/30/14 1909 08/31/14 0159 08/31/14 2104  Weight: 105.235 kg (232 lb) 106.323 kg (234 lb 6.4 oz) 107.185 kg (236 lb 4.8 oz)    History of present illness:  [name] is a 68 y.o. male presents with umbilical area discharge, he underwent emergent emergent exp lap in 01/2014 for strangulated umbilical hernia. He had done fairly well until recently when he started noticing a discharge and drainage from his anterior abdominal wall about a week ago. He had presented earlier this month with similar complaints was discharged with follow up with his PCP. Patient had a CT scan of the abdomen done and this shows a possible fistula  Hospital Course:   Enterocutaneous fistula with minimal output: -no evidence of infection, seen by Surgery, antibiotics were stopped and some dressing changes recommended to patient per surgery. -FU with Dr.Hoxworth, he has an appt in 2 weeks for breast CA  R breast CA -FU with Dr.Hoxworth  Multiple med problems: COPD, Chronic Diastolic CHF, DM, OSA, CKD 3, CVA all his chronic medical problems remained stable and no changes were made to his home regimen   Consultations:  Surgery Dr.Thompson  Discharge Exam: Filed Vitals:   09/01/14 0409  BP: 128/72  Pulse: 73  Temp: 97.3 F (36.3 C)  Resp: 19    General: AAOx3 Cardiovascular: S1S2/RRR,  ESM Respiratory: CTAB  Discharge Instructions You were cared for by a hospitalist during your hospital stay. If you have any questions about your discharge medications or the care you received while you were in the hospital after you are discharged, you can call the unit and asked to speak with the hospitalist on call if the hospitalist that took care of you is not available. Once you are discharged, your primary care physician will handle any further medical issues. Please note that NO REFILLS for any discharge medications will be authorized once you are discharged, as it is imperative that you return to your primary care physician (or establish a relationship with a primary care physician if you do not have one) for your aftercare needs so that they can reassess your need for medications and monitor your lab values.  Discharge Instructions    Diet - low sodium heart healthy    Complete by:  As directed      Diet Carb Modified    Complete by:  As directed      Increase activity slowly    Complete by:  As directed           Current Discharge Medication List    CONTINUE these medications which have NOT CHANGED   Details  allopurinol (ZYLOPRIM) 300 MG tablet Take 1 tablet (300 mg total) by mouth daily. Qty: 30 tablet, Refills: 3    aspirin EC 325 MG EC tablet Take 1 tablet (325 mg total) by mouth daily. Qty: 30 tablet, Refills: 0    atorvastatin (LIPITOR) 20  MG tablet Take 1 tablet (20 mg total) by mouth daily. Qty: 30 tablet, Refills: 1    carvedilol (COREG) 3.125 MG tablet Take 1 tablet (3.125 mg total) by mouth 2 (two) times daily with a meal. Qty: 60 tablet, Refills: 5    colchicine 0.6 MG tablet Take 1 tablet (0.6 mg total) by mouth 2 (two) times daily. Qty: 30 tablet, Refills: 0    FLUoxetine (PROZAC) 20 MG capsule Take 1 capsule (20 mg total) by mouth daily. Qty: 30 capsule, Refills: 3    Fluticasone Furoate 200 MCG/ACT AEPB Inhale 1 puff into the lungs 2 (two) times  daily. Qty: 30 each, Refills: 1    folic acid (FOLVITE) 1 MG tablet Take 1 tablet (1 mg total) by mouth daily. Qty: 30 tablet, Refills: 1    furosemide (LASIX) 80 MG tablet Take 2 tablets in the AM and 1 in the PM Qty: 90 tablet, Refills: 5    gabapentin (NEURONTIN) 300 MG capsule Take 1 capsule (300 mg total) by mouth 2 (two) times daily. Qty: 60 capsule, Refills: 1    insulin glargine (LANTUS) 100 UNIT/ML injection Inject 0.08 mLs (8 Units total) into the skin at bedtime. Qty: 10 mL, Refills: 11    lactulose (CHRONULAC) 10 GM/15ML solution Take 30 mLs (20 g total) by mouth 2 (two) times daily. Qty: 240 mL, Refills: 0    levothyroxine (SYNTHROID, LEVOTHROID) 125 MCG tablet Take 1.5 tablets (187 mcg total) by mouth daily before breakfast. Qty: 30 tablet, Refills: 3    metolazone (ZAROXOLYN) 5 MG tablet Take 1 tablet (5 mg total) by mouth once a week. Take on Friday  and as needed. Take with an extra 20 meq of potassium Qty: 12 tablet, Refills: 6    Multiple Vitamin (MULTIVITAMIN WITH MINERALS) TABS tablet Take 1 tablet by mouth daily.    omeprazole (PRILOSEC) 20 MG capsule Take 1 capsule (20 mg total) by mouth daily. Qty: 30 capsule, Refills: 1    oxyCODONE 10 MG TABS Take 1 tablet (10 mg total) by mouth every 6 (six) hours as needed for severe pain. Qty: 60 tablet, Refills: 0    potassium chloride SA (K-DUR,KLOR-CON) 20 MEQ tablet Take 1 tablet (20 mEq total) by mouth daily. Qty: 30 tablet, Refills: 1      STOP taking these medications     amoxicillin-clavulanate (AUGMENTIN) 875-125 MG per tablet        Allergies  Allergen Reactions  . Erythromycin Hives   Follow-up Information    Follow up with HOXWORTH,BENJAMIN T, MD In 2 weeks.   Specialty:  General Surgery   Why:  already scheduled appointment   Contact information:   [address] [place] [address] [phone]        The results of significant diagnostics from this hospitalization  (including imaging, microbiology, ancillary and laboratory) are listed below for reference.    Significant Diagnostic Studies: Ct Abdomen Pelvis W Contrast  08/30/2014   CLINICAL DATA:  Erythema, swelling and drainage from the umbilicus for several days. Antibiotic therapy completed today. History of diabetes and substance abuse. History of hernia repair and bowel resection in April 2015. Initial encounter.  EXAM: CT ABDOMEN AND PELVIS WITH CONTRAST  TECHNIQUE: Multidetector CT imaging of the abdomen and pelvis was performed using the standard protocol following bolus administration of intravenous contrast.  CONTRAST:  26mL OMNIPAQUE IOHEXOL 300 MG/ML  SOLN  COMPARISON:  Abdominal pelvic CT 01/27/2014.  FINDINGS: Lower chest: Patchy atelectasis  or scarring in both lung bases is similar to the prior examination. There is no significant pleural or pericardial effusion. There is extensive atherosclerosis of the coronary arteries status post median sternotomy and CABG. There is a partially calcified 1.9 cm nodule within the right breast on image number 4, not previously imaged.  Hepatobiliary: The liver is normal in density without focal abnormality. No evidence of gallstones, gallbladder wall thickening or biliary dilatation.  Pancreas: Stable atrophy without focal abnormality. No pancreatic ductal dilatation.  Spleen: Normal in size without focal abnormality.  Adrenals/Urinary Tract: Both adrenal glands appear normal.The kidneys appear stable with perinephric soft tissue stranding and multiple low density lesions bilaterally. There is no hydronephrosis or significant delay in contrast excretion. Mild bladder wall thickening is similar to the prior study.  Stomach/Bowel: Thickening of the proximal stomach is likely due to incomplete distention. There is no small or large bowel wall thickening. Right lower quadrant small bowel anastomosis appears patent. As noted below, these postsurgical changes abut the anterior  abdominal wall and umbilicus. There is no evidence of bowel obstruction or extraluminal fluid collection.  Vascular/Lymphatic: Small retroperitoneal and pelvic lymph nodes are stable there is grossly stable diffuse atherosclerosis of the aorta, its branches and iliac arteries.  Reproductive: Stable.  Other: There are postsurgical changes related to interval umbilical hernia repair. Within the umbilicus, there is a tubular soft tissue structure measuring up to 4.6 x 2.1 cm. There is no air or contrast material within the anterior abdominal wall, although the contrast has not yet passed through the anastomosis. This umbilical process abuts the anastomosis clips in the right lower quadrant.  Musculoskeletal: No acute or significant osseous findings. There are extensive degenerative changes throughout the spine.  IMPRESSION: 1. Interval umbilical hernia repair and partial small bowel resection. There is tubular soft tissue within the anterior abdominal wall which abuts the postsurgical changes within the peritoneal cavity. 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 2. No evidence of intra-abdominal abscess or bowel obstruction. 3. Stable bilateral low density renal lesions. 4. Grossly stable diffuse atherosclerosis. 5. Indeterminate partially calcified nodule in the right breast. Patient did undergo right breast biopsy on 05/16/2014.   Electronically Signed   By: Camie Patience M.D.   On: 08/30/2014 21:40    Microbiology: No results found for this or any previous visit (from the past 240 hour(s)).   Labs: Basic Metabolic Panel:  Recent Labs Lab 08/29/14 1436 08/30/14 1919 08/31/14 0540  NA 134* 137 140  K 3.1* 3.4* 3.3*  CL 87* 92* 93*  CO2 34* 31 31  GLUCOSE 190* 174* 168*  BUN 76* 69* 65*  CREATININE 1.7* 1.56* 1.31  CALCIUM 9.1 9.6 9.3   Liver Function Tests:  Recent Labs Lab 08/30/14 1919 08/31/14 0540  AST 23 20  ALT 13 12  ALKPHOS  80 74  BILITOT 0.6 0.7  PROT 7.3 6.7  ALBUMIN 3.2* 3.0*   No results for input(s): LIPASE, AMYLASE in the last 168 hours. No results for input(s): AMMONIA in the last 168 hours. CBC:  Recent Labs Lab 08/30/14 1919 08/31/14 0540  WBC 6.4 6.4  NEUTROABS 3.7  --   HGB 11.8* 11.1*  HCT 36.4* 35.0*  MCV 94.3 95.4  PLT 136* 127*   Cardiac Enzymes: No results for input(s): CKTOTAL, CKMB, CKMBINDEX, TROPONINI in the last 168 hours. BNP: BNP (last 3 results)  Recent Labs  04/20/14 1655 06/11/14 1624 07/21/14 1300  PROBNP 17870.0* 4440.0*  3542.0*   CBG:  Recent Labs Lab 08/31/14 1152 08/31/14 1647 08/31/14 2048 09/01/14 0724 09/01/14 0747  GLUCAP 168* 177* 152* 135* 122*       Signed:  Gunnard Dorrance  [facility] 09/01/2014, 9:59 AM

## 2014-09-01 NOTE — Discharge Instructions (Signed)
Dry gauze over draining fistula at abdominal wall

## 2014-09-01 NOTE — Progress Notes (Signed)
DC HOME PER ORDER.

## 2014-09-01 NOTE — Progress Notes (Signed)
DC INSTRUCTIONS GIVEN AND AND BOTH PATIENT AND DAUGHTER VERBALIZED UNDERSTANDING.

## 2014-09-01 NOTE — Progress Notes (Signed)
Patient ID: [name], male   DOB: 06-22-1946, 68 y.o.   MRN: [id number]    Subjective: Doing well.  Tolerating solid diet. No c/o  Wants to go home  Objective: Vital signs in last 24 hours: Temp:  [97.3 F (36.3 C)-97.7 F (36.5 C)] 97.3 F (36.3 C) (11/22 0409) Pulse Rate:  [67-82] 73 (11/22 0409) Resp:  [17-19] 19 (11/22 0409) BP: (128-145)/(71-78) 128/72 mmHg (11/22 0409) SpO2:  [94 %-100 %] 94 % (11/22 0843) Weight:  [236 lb 4.8 oz (107.185 kg)] 236 lb 4.8 oz (107.185 kg) (11/21 2104) Last BM Date: 08/29/14  Intake/Output from previous day: 11/21 0701 - 11/22 0700 In: 840 [P.O.:840] Out: 1601 [Urine:1600; Stool:1] Intake/Output this shift: Total I/O In: -  Out: 450 [Urine:450]  PE: Abd: soft, EC fistula present with small bud.  No current drainage Chest: right breast mass is palpable and moderate sized  Lab Results:   Recent Labs  08/30/14 1919 08/31/14 0540  WBC 6.4 6.4  HGB 11.8* 11.1*  HCT 36.4* 35.0*  PLT 136* 127*   BMET  Recent Labs  08/30/14 1919 08/31/14 0540  NA 137 140  K 3.4* 3.3*  CL 92* 93*  CO2 31 31  GLUCOSE 174* 168*  BUN 69* 65*  CREATININE 1.56* 1.31  CALCIUM 9.6 9.3   PT/INR No results for input(s): LABPROT, INR in the last 72 hours. CMP     Component Value Date/Time   NA 140 08/31/2014 0540   K 3.3* 08/31/2014 0540   CL 93* 08/31/2014 0540   CO2 31 08/31/2014 0540   GLUCOSE 168* 08/31/2014 0540   BUN 65* 08/31/2014 0540   CREATININE 1.31 08/31/2014 0540   CALCIUM 9.3 08/31/2014 0540   PROT 6.7 08/31/2014 0540   ALBUMIN 3.0* 08/31/2014 0540   AST 20 08/31/2014 0540   ALT 12 08/31/2014 0540   ALKPHOS 74 08/31/2014 0540   BILITOT 0.7 08/31/2014 0540   GFRNONAA 54* 08/31/2014 0540   GFRAA 62* 08/31/2014 0540   Lipase     Component Value Date/Time   LIPASE 41 12/25/2012 1452       Studies/Results: Ct Abdomen Pelvis W Contrast  08/30/2014   CLINICAL DATA:  Erythema, swelling and drainage from the  umbilicus for several days. Antibiotic therapy completed today. History of diabetes and substance abuse. History of hernia repair and bowel resection in April 2015. Initial encounter.  EXAM: CT ABDOMEN AND PELVIS WITH CONTRAST  TECHNIQUE: Multidetector CT imaging of the abdomen and pelvis was performed using the standard protocol following bolus administration of intravenous contrast.  CONTRAST:  79mL OMNIPAQUE IOHEXOL 300 MG/ML  SOLN  COMPARISON:  Abdominal pelvic CT 01/27/2014.  FINDINGS: Lower chest: Patchy atelectasis or scarring in both lung bases is similar to the prior examination. There is no significant pleural or pericardial effusion. There is extensive atherosclerosis of the coronary arteries status post median sternotomy and CABG. There is a partially calcified 1.9 cm nodule within the right breast on image number 4, not previously imaged.  Hepatobiliary: The liver is normal in density without focal abnormality. No evidence of gallstones, gallbladder wall thickening or biliary dilatation.  Pancreas: Stable atrophy without focal abnormality. No pancreatic ductal dilatation.  Spleen: Normal in size without focal abnormality.  Adrenals/Urinary Tract: Both adrenal glands appear normal.The kidneys appear stable with perinephric soft tissue stranding and multiple low density lesions bilaterally. There is no hydronephrosis or significant delay in contrast excretion. Mild bladder wall thickening is similar to the prior study.  Stomach/Bowel: Thickening of the proximal stomach is likely due to incomplete distention. There is no small or large bowel wall thickening. Right lower quadrant small bowel anastomosis appears patent. As noted below, these postsurgical changes abut the anterior abdominal wall and umbilicus. There is no evidence of bowel obstruction or extraluminal fluid collection.  Vascular/Lymphatic: Small retroperitoneal and pelvic lymph nodes are stable there is grossly stable diffuse atherosclerosis  of the aorta, its branches and iliac arteries.  Reproductive: Stable.  Other: There are postsurgical changes related to interval umbilical hernia repair. Within the umbilicus, there is a tubular soft tissue structure measuring up to 4.6 x 2.1 cm. There is no air or contrast material within the anterior abdominal wall, although the contrast has not yet passed through the anastomosis. This umbilical process abuts the anastomosis clips in the right lower quadrant.  Musculoskeletal: No acute or significant osseous findings. There are extensive degenerative changes throughout the spine.  IMPRESSION: 1. Interval umbilical hernia repair and partial small bowel resection. There is tubular soft tissue within the anterior abdominal wall which abuts the postsurgical changes within the peritoneal cavity. 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 2. No evidence of intra-abdominal abscess or bowel obstruction. 3. Stable bilateral low density renal lesions. 4. Grossly stable diffuse atherosclerosis. 5. Indeterminate partially calcified nodule in the right breast. Patient did undergo right breast biopsy on 05/16/2014.   Electronically Signed   By: Camie Patience M.D.   On: 08/30/2014 21:40    Anti-infectives: Anti-infectives    Start     Dose/Rate Route Frequency Ordered Stop   09/01/14 0000  vancomycin (VANCOCIN) 1,500 mg in sodium chloride 0.9 % 500 mL IVPB  Status:  Discontinued     1,500 mg250 mL/hr over 120 Minutes Intravenous Every 24 hours 08/31/14 0108 08/31/14 0812   08/31/14 0800  piperacillin-tazobactam (ZOSYN) IVPB 3.375 g  Status:  Discontinued     3.375 g12.5 mL/hr over 240 Minutes Intravenous Every 8 hours 08/31/14 0108 08/31/14 0812   08/31/14 0115  vancomycin (VANCOCIN) 2,000 mg in sodium chloride 0.9 % 500 mL IVPB     2,000 mg250 mL/hr over 120 Minutes Intravenous  Once 08/31/14 0108 08/31/14 0548   08/31/14 0115  piperacillin-tazobactam (ZOSYN)  IVPB 3.375 g     3.375 g100 mL/hr over 30 Minutes Intravenous  Once 08/31/14 0108 08/31/14 0652       Assessment/Plan  1. EC fistula with minimal output 2. Right breast cancer  Plan: 1. Ok to [employer] home with follow up in our office for breast cancer and further management of fistula.  He may continue a heart healthy diet.  No abx therapy needed.  This has been stopped already.  D/W Dr. Broadus John.  Will plan for dc home today.   LOS: 2 days    Omesha Bowerman E 09/01/2014, 9:56 AM Pager: [phone]

## 2014-09-02 ENCOUNTER — Other Ambulatory Visit (INDEPENDENT_AMBULATORY_CARE_PROVIDER_SITE_OTHER): Payer: Self-pay | Admitting: General Surgery

## 2014-09-02 ENCOUNTER — Telehealth: Payer: Self-pay | Admitting: Cardiovascular Disease

## 2014-09-02 DIAGNOSIS — C50921 Malignant neoplasm of unspecified site of right male breast: Secondary | ICD-10-CM

## 2014-09-02 NOTE — Telephone Encounter (Signed)
Voicemail not set up. Will continue to try to reach [facility]

## 2014-09-02 NOTE — Telephone Encounter (Signed)
Spoke with Darol Destine and told her per Dr. Angelena Form pt did not need previously scheduled BMP on 11/25 as lab work was checked during recent hospitalization.  Pt will need BMP checked on September 10, 2014 and Darol Destine will draw this.  She will let pt's daughter know.

## 2014-09-02 NOTE — Telephone Encounter (Signed)
Please contact Shae at [facility]. States patient has been admitted to hospital. [phone].

## 2014-09-03 ENCOUNTER — Telehealth: Payer: Self-pay | Admitting: *Deleted

## 2014-09-03 NOTE — Telephone Encounter (Signed)
Received referral from [place].  Emailed Engineer, civil (consulting) at [employer] requesting office notes since [place] was unable to pull them up in [employer]. Called pt and spoke with his daughter [name] and confirmed 09/09/14 appt w/ her.  Mailed before appt letter, welcoming packet, calendar and intake form to pt.  Emailed Engineer, civil (consulting) at [employer] to make her aware.  Added to spreadsheet.

## 2014-09-09 ENCOUNTER — Ambulatory Visit (HOSPITAL_BASED_OUTPATIENT_CLINIC_OR_DEPARTMENT_OTHER): Payer: Medicare Other | Admitting: Hematology and Oncology

## 2014-09-09 ENCOUNTER — Telehealth: Payer: Self-pay | Admitting: Hematology and Oncology

## 2014-09-09 ENCOUNTER — Encounter: Payer: Self-pay | Admitting: Hematology and Oncology

## 2014-09-09 ENCOUNTER — Ambulatory Visit: Payer: Medicare Other

## 2014-09-09 DIAGNOSIS — C50921 Malignant neoplasm of unspecified site of right male breast: Secondary | ICD-10-CM

## 2014-09-09 DIAGNOSIS — Z170 Estrogen receptor positive status [ER+]: Secondary | ICD-10-CM

## 2014-09-09 MED ORDER — TAMOXIFEN CITRATE 20 MG PO TABS: 20.0000 mg | ORAL_TABLET | Freq: Every day | ORAL | Status: DC

## 2014-09-09 NOTE — Assessment & Plan Note (Addendum)
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 T1 C. N0 M0 stage IA. This was diagnosed August 2015. Attempts for surgery were hampered by hospitalization for left cerebellar infarct with hemorrhage as well as enterocutaneous fistula around the umbilicus from prior umbilicus hernia surgery. He has been sent was for discussion regarding treatment options.  Pathology counseling: I discussed the pathology report including the nature of his cancer as well as staging and significance of estrogen, progesterone receptors and HER-2 receptors.  Recommendation: Neoadjuvant tamoxifen 20 mg daily. Once his health improves and he becomes a surgical candidate, he can undergo mastectomy at that time. Tamoxifen counseling:We discussed the risks and benefits of tamoxifen. These include but not limited to insomnia, hot flashes, mood changes, and weight gain. Although rare, serious side effects including risk of blood clots were also discussed. I'm concerned that the patient has sedentary and has chronic leg edema from CHF as well as a recent history of ischemic stroke. But if nothing is done, the breast cancer is likely to progress in size. Patient was informed by his primary care physician that the breast lump had increased from August to now.  Multiple comorbidities including CKD stage III, CHF, COPD, chronic venous stasis, recent CVA.  Return to clinic in 2 months for tamoxifen toxicity check and followup.

## 2014-09-09 NOTE — Progress Notes (Signed)
Checked in new pt with no financial concerns. °

## 2014-09-09 NOTE — Telephone Encounter (Signed)
, °

## 2014-09-09 NOTE — Progress Notes (Signed)
[facility] CONSULT NOTE  Patient Care Team: Jene Every, MD as PCP - General (Family Medicine)  CHIEF COMPLAINTS/PURPOSE OF CONSULTATION:  breast cancer  HISTORY OF PRESENTING ILLNESS:  [name] 68 y.o. male is here because of recent diagnosis of Right-sided breast cancer. He was found to have a lump in the right breast that led to further evaluation with mammograms at [facility]. This would prefer to the breast center where he underwent a biopsy on 05/16/2014 that revealed right-sided breast cancer or o'clock position ER/PR positive HER-2 negative with a Ki-67 of 51%. 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 After undergoing treatment for these conditions, he was discharged home. He was referred to [place] for discussion regarding antiestrogen therapy. He is accompanied by his daughter who is his primary caregiver.  I reviewed her records extensively and collaborated the history with the patient.  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 CHRONIC OBSTRUCTIVE PULMONARY DISEASE 06/20/2009  . CAROTID STENOSIS 06/20/2009    A. 08/2001 s/p L CEA;  B.   09/14/11 - Carotid U/S - 40-59% bilateral stenosis, left CEA patch angioplasty is patent  . DM 06/20/2009  . CAD 06/20/2009    A.  08/2000 - s/p CABG x 4 - LIMA-LAD, Left Radial-OM, VG-DIAG, VG-RCA;  B. Neg. MV  2010  . HYPERLIPIDEMIA 06/20/2009  . HYPERTENSION 06/20/2009  . Hypothyroidism   . Low back pain   . Asthma      as child  . Pneumonia   . Atrial fibrillation     Not felt to be coumadin candidate 2/2 ETOH use.  Marland Kitchen History of tobacco abuse     remote - quit 1970  . Bilateral renal cysts   . Morbidly obese   . CHF (congestive heart failure)   . Falls frequently   . Hx of cardiovascular stress test 05/2014    Lexiscan Myoview (8/15):  No ischemia; EF 63% - Normal Study  . Breast cancer, right breast   . Gout   . OBSTRUCTIVE SLEEP APNEA 06/20/2009    does not use cpap  . Stroke     found on MRI in Oct. 2015.   Marland Kitchen Shortness of breath dyspnea     occasional  . Depression     takes Prozac  . Kidney disease     Stage 3  . GERD (gastroesophageal reflux disease)     takes Prilosec  . Arthritis   . Neuromuscular disorder     neuropathy in both legs  . Constipation   . ETOH abuse     no alcohol since Oct. 2015    SURGICAL HISTORY: Past Surgical History  Procedure Laterality Date  . Carotid endarterectomy  2002    left  . Coronary artery bypass graft      x 4 - 2001  . Umbilical hernia repair N/A 01/28/2014    Procedure: HERNIA REPAIR UMBILICAL ADULT/INC;  Surgeon: Harl Bowie, MD;  Location: [facility];  Service: General;  Laterality: N/A;  .  Laparotomy N/A 01/28/2014    Procedure: EXPLORATORY LAPAROTOMY;  Surgeon: Harl Bowie, MD;  Location: [facility];  Service: General;  Laterality: N/A;  . Bowel resection N/A 01/28/2014    Procedure: SMALL BOWEL RESECTION;  Surgeon: Harl Bowie, MD;  Location: [facility];  Service: General;  Laterality: N/A;  . Hernia repair    . Colonoscopy      SOCIAL HISTORY: History   Social History  . Marital Status: Single    Spouse Name: N/A    Number of Children: N/A  . Years of Education: N/A   Occupational History  . retired    Social History Main Topics  . Smoking status: Former Smoker -- 1.00 packs/day for 16 years    Quit date: 10/11/1968  . Smokeless tobacco: Never Used  . Alcohol Use: Yes     Comment: wild [place] daily;  also  reports regularly drinking a pint of vodka.  . Drug Use: Yes    Special: Marijuana     Comment: last 1 year ago  . Sexual Activity: No   Other Topics Concern  . Not on file   Social History Narrative   Lives in [place] with dtr, son-in-law    FAMILY HISTORY: Family History  Problem Relation Age of Onset  . Stroke Mother     ?  Marland Kitchen Cancer Mother     unknown type of cancer  . Stroke Father     ?  Marland Kitchen Heart attack Neg Hx   . Cancer Brother 61    stomach cancer    ALLERGIES:  is allergic to erythromycin.  MEDICATIONS:  Current Outpatient Prescriptions  Medication Sig Dispense Refill  . allopurinol (ZYLOPRIM) 300 MG tablet Take 1 tablet (300 mg total) by mouth daily. 30 tablet 3  . aspirin EC 325 MG EC tablet Take 1 tablet (325 mg total) by mouth daily. 30 tablet 0  . atorvastatin (LIPITOR) 20 MG tablet Take 1 tablet (20 mg total) by mouth daily. 30 tablet 1  . carvedilol (COREG) 3.125 MG tablet Take 1 tablet (3.125 mg total) by mouth 2 (two) times daily with a meal. 60 tablet 5  . colchicine 0.6 MG tablet Take 1 tablet (0.6 mg total) by mouth 2 (two) times daily. 30 tablet 0  . doxycycline (VIBRA-TABS) 100 MG tablet Take 100 mg by mouth.    Marland Kitchen FLUoxetine (PROZAC) 20 MG capsule Take 1 capsule (20 mg total) by mouth daily. 30 capsule 3  . Fluticasone Furoate 200 MCG/ACT AEPB Inhale 1 puff into the lungs 2 (two) times daily. 30 each 1  . folic acid (FOLVITE) 1 MG tablet Take 1 tablet (1 mg total) by mouth daily. 30 tablet 1  . furosemide (LASIX) 80 MG tablet Take 2 tablets in the AM and 1 in the PM (Patient taking differently: Take 160 mg by mouth See admin instructions. Take to tablets in the morning and 1 tablet in the afternoon per daughter) 90 tablet 5  . gabapentin (NEURONTIN) 300 MG capsule Take 1 capsule (300 mg total) by mouth 2 (two) times daily. 60 capsule 1  . insulin glargine (LANTUS) 100 UNIT/ML injection Inject 0.08 mLs (8 Units total) into the skin at bedtime.  (Patient taking differently: Inject 12 Units into the skin at bedtime. ) 10 mL 11  . lactulose (CHRONULAC) 10 GM/15ML solution Take 30 mLs (20 g total) by mouth 2 (two) times daily. 240 mL 0  . levothyroxine (SYNTHROID, LEVOTHROID) 125 MCG tablet Take 1.5  tablets (187 mcg total) by mouth daily before breakfast. 30 tablet 3  . metolazone (ZAROXOLYN) 5 MG tablet Take 1 tablet (5 mg total) by mouth once a week. Take on Friday  and as needed. Take with an extra 20 meq of potassium (Patient taking differently: Take 5 mg by mouth once a week. On Friday) 12 tablet 6  . Multiple Vitamin (MULTIVITAMIN WITH MINERALS) TABS tablet Take 1 tablet by mouth daily.    Marland Kitchen omeprazole (PRILOSEC) 20 MG capsule Take 1 capsule (20 mg total) by mouth daily. 30 capsule 1  . oxyCODONE 10 MG TABS Take 1 tablet (10 mg total) by mouth every 6 (six) hours as needed for severe pain. 60 tablet 0  . potassium chloride SA (K-DUR,KLOR-CON) 20 MEQ tablet Take 1 tablet (20 mEq total) by mouth daily. (Patient taking differently: Take 20-40 mEq by mouth daily. Take 2 tablets on Friday then take 1 tablet all other days) 30 tablet 1  . tamoxifen (NOLVADEX) 20 MG tablet Take 1 tablet (20 mg total) by mouth daily. 90 tablet 3   No current facility-administered medications for this visit.    REVIEW OF SYSTEMS:   Constitutional: Denies fevers, chills or abnormal night sweats Eyes: Denies blurriness of vision, double vision or watery eyes Ears, nose, mouth, throat, and face: Denies mucositis or sore throat Respiratory: shortness of breath Cardiovascular: shortness of breath exertion Gastrointestinal:  Denies nausea, heartburn or change in bowel habits Skin: mild redness or edema both legs left greater than right Lymphatics: Denies new lymphadenopathy or easy bruising Neurological:Denies numbness, tingling or new weaknesses Behavioral/Psych: Mood is stable, no new changes  Breast: lump in the right breast All other systems were reviewed  with the patient and are negative.  PHYSICAL EXAMINATION: ECOG PERFORMANCE STATUS: 2 - Symptomatic, <50% confined to bed  Filed Vitals:   09/09/14 1310  BP: 107/50  Pulse: 76  Temp: 97.9 F (36.6 C)  Resp: 18   Filed Weights   09/09/14 1310  Weight: 236 lb 4.8 oz (107.185 kg)    GENERAL:alert, no distress and comfortable SKIN: skin color, texture, turgor are normal, no rashes or significant lesions EYES: normal, conjunctiva are pink and non-injected, sclera clear OROPHARYNX:no exudate, no erythema and lips, buccal mucosa, and tongue normal  NECK: supple, thyroid normal size, non-tender, without nodularity LYMPH:  no palpable lymphadenopathy in the cervical, axillary or inguinal LUNGS: clear to auscultation and percussion with normal breathing effort HEART: regular rate & rhythm and no murmurs and no lower extremity edema ABDOMEN:abdomen soft, non-tender and normal bowel sounds Musculoskeletal:no cyanosis of digits and no clubbing  PSYCH: alert & oriented x 3 with fluent speech NEURO: no focal motor/sensory deficits BREASTpalpable mass in the right breast 12:00 position  . No palpable axillary or supraclavicular lymphadenopathy  LABORATORY DATA:  I have reviewed the data as listed Lab Results  Component Value Date   WBC 6.4 08/31/2014   HGB 11.1* 08/31/2014   HCT 35.0* 08/31/2014   MCV 95.4 08/31/2014   PLT 127* 08/31/2014   Lab Results  Component Value Date   NA 140 08/31/2014   K 3.3* 08/31/2014   CL 93* 08/31/2014   CO2 31 08/31/2014    RADIOGRAPHIC STUDIES: I have personally reviewed the radiological reports and agreed with the findings in the report.  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 T1 C.  N0 M0 stage IA. This was diagnosed August 2015. 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 He has been sent was for discussion regarding treatment options.  Pathology counseling: I discussed the pathology report including the nature of his cancer as well as staging and significance of estrogen, progesterone receptors and HER-2 receptors.  Recommendation: Neoadjuvant tamoxifen 20 mg daily. Once his health improves and he becomes a surgical candidate, he can undergo mastectomy at that time. Tamoxifen counseling:We discussed the risks and benefits of tamoxifen. These include but not limited to insomnia, hot flashes, mood changes, and weight gain. Although rare, serious side effects including risk of blood clots were also discussed. I'm concerned that the patient has sedentary and has chronic leg edema from CHF as well as a recent history of ischemic stroke. But if nothing is done, the breast cancer is likely to progress in size. Patient was informed by his primary care physician that the breast lump had increased from August to now.  Multiple comorbidities including CKD stage III, CHF, COPD, chronic venous stasis, recent CVA.  Return to clinic in 2 months for tamoxifen toxicity check and followup.   All questions were answered. The patient knows to call the clinic with any problems, questions or concerns.    Rulon Eisenmenger, MD 09/09/2014 1:52 PM

## 2014-09-11 ENCOUNTER — Encounter: Payer: Self-pay | Admitting: Cardiovascular Disease

## 2014-09-13 ENCOUNTER — Other Ambulatory Visit: Payer: Self-pay | Admitting: *Deleted

## 2014-09-13 MED ORDER — POTASSIUM CHLORIDE CRYS ER 20 MEQ PO TBCR: 60.0000 meq | EXTENDED_RELEASE_TABLET | Freq: Every day | ORAL | Status: DC

## 2014-09-17 ENCOUNTER — Telehealth: Payer: Self-pay | Admitting: Cardiovascular Disease

## 2014-09-17 NOTE — Telephone Encounter (Signed)
Spoke with Ok Edwards at [facility]. She saw patient first thing this am. Left lower extremity edema 3-4+, rt le 2-3 +. He is non-compliant with compression stockings. No change in SOb. Daughter reports that he has been drinking 4-5 airplane bottles of liquor over the weekend. Just a FYI. They drew labs last week, bun was 70, creatinine 1.7

## 2014-09-17 NOTE — Telephone Encounter (Signed)
Left message with shea at [facility] to call back.

## 2014-09-17 NOTE — Telephone Encounter (Signed)
Thanks. cdm 

## 2014-09-17 NOTE — Telephone Encounter (Signed)
New Message  Ok Edwards with [facility]. She reports she has completed a home visit with the pt and wanted the inform the office of  Dr. Angelena Form that the pt's swelling has increased in his left leg and foot. The pt is non-compliant with the wearing of his compression hose and has consumed alcohol. Please call to discuss if needed.

## 2014-09-17 NOTE — Telephone Encounter (Signed)
Follow Up  Ok Edwards called back

## 2014-10-02 ENCOUNTER — Other Ambulatory Visit: Payer: Self-pay

## 2014-10-02 ENCOUNTER — Emergency Department (HOSPITAL_COMMUNITY): Payer: Medicare Other

## 2014-10-02 ENCOUNTER — Encounter (HOSPITAL_COMMUNITY): Payer: Self-pay | Admitting: Emergency Medicine

## 2014-10-02 ENCOUNTER — Inpatient Hospital Stay (HOSPITAL_COMMUNITY)
Admission: EM | Admit: 2014-10-02 | Discharge: 2014-10-04 | DRG: 291 | Disposition: A | Discharge status: Home / Self Care | Payer: Medicare Other | Attending: Internal Medicine | Admitting: Internal Medicine

## 2014-10-02 DIAGNOSIS — Z6833 Body mass index (BMI) 33.0-33.9, adult: Secondary | ICD-10-CM | POA: Diagnosis not present

## 2014-10-02 DIAGNOSIS — Z8673 Personal history of transient ischemic attack (TIA), and cerebral infarction without residual deficits: Secondary | ICD-10-CM

## 2014-10-02 DIAGNOSIS — Z823 Family history of stroke: Secondary | ICD-10-CM | POA: Diagnosis not present

## 2014-10-02 DIAGNOSIS — Z794 Long term (current) use of insulin: Secondary | ICD-10-CM

## 2014-10-02 DIAGNOSIS — C50921 Malignant neoplasm of unspecified site of right male breast: Secondary | ICD-10-CM

## 2014-10-02 DIAGNOSIS — J449 Chronic obstructive pulmonary disease, unspecified: Secondary | ICD-10-CM | POA: Diagnosis present

## 2014-10-02 DIAGNOSIS — E039 Hypothyroidism, unspecified: Secondary | ICD-10-CM | POA: Diagnosis present

## 2014-10-02 DIAGNOSIS — E119 Type 2 diabetes mellitus without complications: Secondary | ICD-10-CM | POA: Diagnosis present

## 2014-10-02 DIAGNOSIS — G4733 Obstructive sleep apnea (adult) (pediatric): Secondary | ICD-10-CM | POA: Diagnosis present

## 2014-10-02 DIAGNOSIS — Z79810 Long term (current) use of selective estrogen receptor modulators (SERMs): Secondary | ICD-10-CM

## 2014-10-02 DIAGNOSIS — D696 Thrombocytopenia, unspecified: Secondary | ICD-10-CM | POA: Diagnosis present

## 2014-10-02 DIAGNOSIS — K219 Gastro-esophageal reflux disease without esophagitis: Secondary | ICD-10-CM | POA: Diagnosis present

## 2014-10-02 DIAGNOSIS — E6609 Other obesity due to excess calories: Secondary | ICD-10-CM | POA: Diagnosis present

## 2014-10-02 DIAGNOSIS — F1010 Alcohol abuse, uncomplicated: Secondary | ICD-10-CM | POA: Diagnosis present

## 2014-10-02 DIAGNOSIS — I130 Hypertensive heart and chronic kidney disease with heart failure and stage 1 through stage 4 chronic kidney disease, or unspecified chronic kidney disease: Principal | ICD-10-CM | POA: Diagnosis present

## 2014-10-02 DIAGNOSIS — I119 Hypertensive heart disease without heart failure: Secondary | ICD-10-CM | POA: Diagnosis present

## 2014-10-02 DIAGNOSIS — Z951 Presence of aortocoronary bypass graft: Secondary | ICD-10-CM

## 2014-10-02 DIAGNOSIS — Z79899 Other long term (current) drug therapy: Secondary | ICD-10-CM | POA: Diagnosis not present

## 2014-10-02 DIAGNOSIS — N183 Chronic kidney disease, stage 3 (moderate): Secondary | ICD-10-CM | POA: Diagnosis present

## 2014-10-02 DIAGNOSIS — E876 Hypokalemia: Secondary | ICD-10-CM | POA: Diagnosis not present

## 2014-10-02 DIAGNOSIS — M109 Gout, unspecified: Secondary | ICD-10-CM | POA: Diagnosis present

## 2014-10-02 DIAGNOSIS — Z7982 Long term (current) use of aspirin: Secondary | ICD-10-CM

## 2014-10-02 DIAGNOSIS — K632 Fistula of intestine: Secondary | ICD-10-CM | POA: Diagnosis present

## 2014-10-02 DIAGNOSIS — I5043 Acute on chronic combined systolic (congestive) and diastolic (congestive) heart failure: Secondary | ICD-10-CM | POA: Diagnosis present

## 2014-10-02 DIAGNOSIS — Z9181 History of falling: Secondary | ICD-10-CM | POA: Diagnosis not present

## 2014-10-02 DIAGNOSIS — Z87891 Personal history of nicotine dependence: Secondary | ICD-10-CM

## 2014-10-02 DIAGNOSIS — I509 Heart failure, unspecified: Secondary | ICD-10-CM | POA: Insufficient documentation

## 2014-10-02 DIAGNOSIS — R531 Weakness: Secondary | ICD-10-CM

## 2014-10-02 DIAGNOSIS — I2510 Atherosclerotic heart disease of native coronary artery without angina pectoris: Secondary | ICD-10-CM | POA: Diagnosis present

## 2014-10-02 DIAGNOSIS — Z881 Allergy status to other antibiotic agents status: Secondary | ICD-10-CM

## 2014-10-02 DIAGNOSIS — R0602 Shortness of breath: Secondary | ICD-10-CM | POA: Diagnosis not present

## 2014-10-02 DIAGNOSIS — I480 Paroxysmal atrial fibrillation: Secondary | ICD-10-CM | POA: Diagnosis present

## 2014-10-02 DIAGNOSIS — E785 Hyperlipidemia, unspecified: Secondary | ICD-10-CM | POA: Diagnosis present

## 2014-10-02 DIAGNOSIS — D649 Anemia, unspecified: Secondary | ICD-10-CM | POA: Diagnosis present

## 2014-10-02 DIAGNOSIS — M79605 Pain in left leg: Secondary | ICD-10-CM | POA: Diagnosis present

## 2014-10-02 DIAGNOSIS — L03116 Cellulitis of left lower limb: Secondary | ICD-10-CM | POA: Diagnosis present

## 2014-10-02 HISTORY — DX: Chronic diastolic (congestive) heart failure: I50.32

## 2014-10-02 HISTORY — DX: Other persistent atrial fibrillation: I48.19

## 2014-10-02 LAB — CBC WITH DIFFERENTIAL/PLATELET
Basophils Absolute: 0 10*3/uL (ref 0.0–0.1)
Basophils Relative: 0 % (ref 0–1)
EOS PCT: 2 % (ref 0–5)
Eosinophils Absolute: 0.1 10*3/uL (ref 0.0–0.7)
HCT: 38.2 % — ABNORMAL LOW (ref 39.0–52.0)
Hemoglobin: 12.3 g/dL — ABNORMAL LOW (ref 13.0–17.0)
LYMPHS ABS: 0.7 10*3/uL (ref 0.7–4.0)
Lymphocytes Relative: 10 % — ABNORMAL LOW (ref 12–46)
MCH: 31.1 pg (ref 26.0–34.0)
MCHC: 32.2 g/dL (ref 30.0–36.0)
MCV: 96.7 fL (ref 78.0–100.0)
Monocytes Absolute: 0.4 10*3/uL (ref 0.1–1.0)
Monocytes Relative: 6 % (ref 3–12)
NEUTROS PCT: 82 % — AB (ref 43–77)
Neutro Abs: 5.7 10*3/uL (ref 1.7–7.7)
Platelets: 110 10*3/uL — ABNORMAL LOW (ref 150–400)
RBC: 3.95 MIL/uL — ABNORMAL LOW (ref 4.22–5.81)
RDW: 17.2 % — ABNORMAL HIGH (ref 11.5–15.5)
WBC: 6.9 10*3/uL (ref 4.0–10.5)

## 2014-10-02 LAB — COMPREHENSIVE METABOLIC PANEL
ALK PHOS: 68 U/L (ref 39–117)
ALT: 15 U/L (ref 0–53)
AST: 25 U/L (ref 0–37)
Albumin: 3.3 g/dL — ABNORMAL LOW (ref 3.5–5.2)
Anion gap: 12 (ref 5–15)
BUN: 60 mg/dL — ABNORMAL HIGH (ref 6–23)
CALCIUM: 8.2 mg/dL — AB (ref 8.4–10.5)
CO2: 30 mmol/L (ref 19–32)
Chloride: 94 mEq/L — ABNORMAL LOW (ref 96–112)
Creatinine, Ser: 1.35 mg/dL (ref 0.50–1.35)
GFR, EST AFRICAN AMERICAN: 60 mL/min — AB (ref >90)
GFR, EST NON AFRICAN AMERICAN: 52 mL/min — AB (ref >90)
Glucose, Bld: 128 mg/dL — ABNORMAL HIGH (ref 70–99)
Potassium: 3.5 mmol/L (ref 3.5–5.1)
SODIUM: 136 mmol/L (ref 135–145)
Total Bilirubin: 0.7 mg/dL (ref 0.3–1.2)
Total Protein: 7 g/dL (ref 6.0–8.3)

## 2014-10-02 LAB — URINALYSIS, ROUTINE W REFLEX MICROSCOPIC
Bilirubin Urine: NEGATIVE
Glucose, UA: NEGATIVE mg/dL
HGB URINE DIPSTICK: NEGATIVE
Ketones, ur: NEGATIVE mg/dL
Leukocytes, UA: NEGATIVE
Nitrite: NEGATIVE
Protein, ur: NEGATIVE mg/dL
Specific Gravity, Urine: 1.011 (ref 1.005–1.030)
Urobilinogen, UA: 1 mg/dL (ref 0.0–1.0)
pH: 5.5 (ref 5.0–8.0)

## 2014-10-02 LAB — GLUCOSE, CAPILLARY: GLUCOSE-CAPILLARY: 191 mg/dL — AB (ref 70–99)

## 2014-10-02 LAB — BRAIN NATRIURETIC PEPTIDE: B Natriuretic Peptide: 337.7 pg/mL — ABNORMAL HIGH (ref 0.0–100.0)

## 2014-10-02 MED ORDER — CARVEDILOL 3.125 MG PO TABS
3.1250 mg | ORAL_TABLET | Freq: Two times a day (BID) | ORAL | Status: DC
  Administered 2014-10-03 (×2): 3.125 mg via ORAL
  Filled 2014-10-02 (×5): qty 1

## 2014-10-02 MED ORDER — DEXTROSE 5 % IV SOLN
120.0000 mg | Freq: Two times a day (BID) | INTRAVENOUS | Status: DC
  Administered 2014-10-03: 120 mg via INTRAVENOUS
  Filled 2014-10-02 (×3): qty 12

## 2014-10-02 MED ORDER — FUROSEMIDE 10 MG/ML IJ SOLN
120.0000 mg | Freq: Once | INTRAVENOUS | Status: AC
  Administered 2014-10-02: 120 mg via INTRAVENOUS
  Filled 2014-10-02: qty 12

## 2014-10-02 MED ORDER — COLCHICINE 0.6 MG PO TABS
0.6000 mg | ORAL_TABLET | Freq: Two times a day (BID) | ORAL | Status: DC
  Administered 2014-10-03 (×2): 0.6 mg via ORAL
  Filled 2014-10-02 (×4): qty 1

## 2014-10-02 MED ORDER — INSULIN GLARGINE 100 UNIT/ML ~~LOC~~ SOLN
8.0000 [IU] | Freq: Every day | SUBCUTANEOUS | Status: DC
  Administered 2014-10-03 (×2): 8 [IU] via SUBCUTANEOUS
  Filled 2014-10-02 (×2): qty 0.08

## 2014-10-02 MED ORDER — METOLAZONE 5 MG PO TABS
5.0000 mg | ORAL_TABLET | ORAL | Status: DC
  Filled 2014-10-02: qty 1

## 2014-10-02 MED ORDER — POTASSIUM CHLORIDE CRYS ER 20 MEQ PO TBCR
60.0000 meq | EXTENDED_RELEASE_TABLET | Freq: Every day | ORAL | Status: DC
  Administered 2014-10-03: 60 meq via ORAL
  Filled 2014-10-02 (×2): qty 3

## 2014-10-02 MED ORDER — TAMOXIFEN CITRATE 10 MG PO TABS
20.0000 mg | ORAL_TABLET | Freq: Every day | ORAL | Status: DC
  Administered 2014-10-03: 20 mg via ORAL
  Filled 2014-10-02: qty 2

## 2014-10-02 MED ORDER — FLUTICASONE FUROATE 200 MCG/ACT IN AEPB: 1.0000 | INHALATION_SPRAY | Freq: Two times a day (BID) | RESPIRATORY_TRACT | Status: DC

## 2014-10-02 MED ORDER — LEVOTHYROXINE SODIUM 50 MCG PO TABS
187.0000 ug | ORAL_TABLET | Freq: Every day | ORAL | Status: DC
  Administered 2014-10-03: 187 ug via ORAL
  Filled 2014-10-02 (×3): qty 1

## 2014-10-02 MED ORDER — ATORVASTATIN CALCIUM 20 MG PO TABS
20.0000 mg | ORAL_TABLET | Freq: Every day | ORAL | Status: DC
  Administered 2014-10-03: 20 mg via ORAL
  Filled 2014-10-02 (×2): qty 1

## 2014-10-02 MED ORDER — FOLIC ACID 1 MG PO TABS
1.0000 mg | ORAL_TABLET | Freq: Every day | ORAL | Status: DC
  Administered 2014-10-03: 1 mg via ORAL
  Filled 2014-10-02 (×2): qty 1

## 2014-10-02 MED ORDER — OXYCODONE HCL 5 MG PO TABS
10.0000 mg | ORAL_TABLET | Freq: Four times a day (QID) | ORAL | Status: DC | PRN
  Administered 2014-10-03 (×3): 10 mg via ORAL
  Filled 2014-10-02 (×3): qty 2

## 2014-10-02 MED ORDER — ASPIRIN EC 325 MG PO TBEC
325.0000 mg | DELAYED_RELEASE_TABLET | Freq: Every day | ORAL | Status: DC
  Administered 2014-10-03: 325 mg via ORAL
  Filled 2014-10-02 (×2): qty 1

## 2014-10-02 MED ORDER — FLUOXETINE HCL 20 MG PO CAPS
20.0000 mg | ORAL_CAPSULE | Freq: Every day | ORAL | Status: DC
  Administered 2014-10-03: 20 mg via ORAL
  Filled 2014-10-02 (×2): qty 1

## 2014-10-02 MED ORDER — INSULIN ASPART 100 UNIT/ML ~~LOC~~ SOLN
0.0000 [IU] | Freq: Three times a day (TID) | SUBCUTANEOUS | Status: DC
  Administered 2014-10-03: 1 [IU] via SUBCUTANEOUS
  Administered 2014-10-03: 3 [IU] via SUBCUTANEOUS

## 2014-10-02 MED ORDER — ASPIRIN EC 325 MG PO TBEC: 325.0000 mg | DELAYED_RELEASE_TABLET | Freq: Every day | ORAL | Status: DC

## 2014-10-02 MED ORDER — ADULT MULTIVITAMIN W/MINERALS CH
1.0000 | ORAL_TABLET | Freq: Every day | ORAL | Status: DC
  Administered 2014-10-03: 1 via ORAL
  Filled 2014-10-02 (×2): qty 1

## 2014-10-02 MED ORDER — PANTOPRAZOLE SODIUM 40 MG PO TBEC
40.0000 mg | DELAYED_RELEASE_TABLET | Freq: Every day | ORAL | Status: DC
  Administered 2014-10-03: 40 mg via ORAL
  Filled 2014-10-02: qty 1

## 2014-10-02 MED ORDER — OXYCODONE HCL 5 MG PO TABS
10.0000 mg | ORAL_TABLET | Freq: Once | ORAL | Status: AC
Start: 2014-10-02 — End: 2014-10-02
  Administered 2014-10-02: 10 mg via ORAL
  Filled 2014-10-02: qty 2

## 2014-10-02 MED ORDER — ENOXAPARIN SODIUM 40 MG/0.4ML ~~LOC~~ SOLN
40.0000 mg | SUBCUTANEOUS | Status: DC
  Administered 2014-10-03: 40 mg via SUBCUTANEOUS
  Filled 2014-10-02: qty 0.4

## 2014-10-02 MED ORDER — ALLOPURINOL 300 MG PO TABS
300.0000 mg | ORAL_TABLET | Freq: Every day | ORAL | Status: DC
  Administered 2014-10-03: 300 mg via ORAL
  Filled 2014-10-02 (×2): qty 1

## 2014-10-02 MED ORDER — GABAPENTIN 300 MG PO CAPS
300.0000 mg | ORAL_CAPSULE | Freq: Two times a day (BID) | ORAL | Status: DC
Start: 2014-10-03 — End: 2014-10-04
  Administered 2014-10-03 (×3): 300 mg via ORAL
  Filled 2014-10-02 (×5): qty 1

## 2014-10-02 MED ORDER — DEXTROSE 5 % IV SOLN
100.0000 mg | Freq: Two times a day (BID) | INTRAVENOUS | Status: DC
  Administered 2014-10-03 (×2): 100 mg via INTRAVENOUS
  Filled 2014-10-02 (×4): qty 100

## 2014-10-02 MED ORDER — LACTULOSE 10 GM/15ML PO SOLN
20.0000 g | Freq: Two times a day (BID) | ORAL | Status: DC
  Filled 2014-10-02 (×2): qty 30

## 2014-10-02 NOTE — ED Provider Notes (Signed)
CSN: [id number]     Arrival date & time 10/02/14  1709 History   First MD Initiated Contact with Patient 10/02/14 1710     Chief Complaint  Patient presents with  . Shortness of Breath     (Consider location/radiation/quality/duration/timing/severity/associated sxs/prior Treatment) HPI [name] is a 68 year old male with past medical history of COPD, CAD, CHF with last echo 04/2014 with EF 50-55%, hypertension, hyperlipidemia, right-sided breast cancer who presents the ER with complaint of generalized weakness in his legs and bilateral pedal edema. Patient states he typically has some baseline weakness in his legs, walks with a walker, and has baseline edema in his legs as well. Patient states he was taking a nap with a heater on in the room. Patient states he believed to left the heater onto long, when he woke up he noticed an increase in the weakness in his legs bilaterally when he tried to stand up. Patient states he had almost no strength in his legs trying to stand up. Patient denies having any associated dizziness, lightheadedness, syncope, headache, chest pain, shortness of breath, nausea, vomiting, diarrhea, abdominal pain, dysuria. Patient denies recent illness, cough, fever, congestion.   Past Medical History  Diagnosis Date  . CHRONIC OBSTRUCTIVE PULMONARY DISEASE 06/20/2009  . CAROTID STENOSIS 06/20/2009    A. 08/2001 s/p L CEA;  B.   09/14/11 - Carotid U/S - 40-59% bilateral stenosis, left CEA patch angioplasty is patent  . DM 06/20/2009  . CAD 06/20/2009    A.  08/2000 - s/p CABG x 4 - LIMA-LAD, Left Radial-OM, VG-DIAG, VG-RCA;  B. Neg. MV  2010  . HYPERLIPIDEMIA 06/20/2009  . HYPERTENSION 06/20/2009  . Hypothyroidism   . Low back pain   . Asthma     as child  . Pneumonia   . Atrial fibrillation     Not felt to be coumadin candidate 2/2 ETOH use.  Marland Kitchen History of tobacco abuse     remote - quit 1970  . Bilateral renal cysts   . Morbidly obese   . CHF (congestive heart  failure)   . Falls frequently   . Hx of cardiovascular stress test 05/2014    Lexiscan Myoview (8/15):  No ischemia; EF 63% - Normal Study  . Breast cancer, right breast   . Gout   . OBSTRUCTIVE SLEEP APNEA 06/20/2009    does not use cpap  . Stroke     found on MRI in Oct. 2015.   Marland Kitchen Shortness of breath dyspnea     occasional  . Depression     takes Prozac  . Kidney disease     Stage 3  . GERD (gastroesophageal reflux disease)     takes Prilosec  . Arthritis   . Neuromuscular disorder     neuropathy in both legs  . Constipation   . ETOH abuse     no alcohol since Oct. 2015   Past Surgical History  Procedure Laterality Date  . Carotid endarterectomy  2002    left  . Coronary artery bypass graft      x 4 - 2001  . Umbilical hernia repair N/A 01/28/2014    Procedure: HERNIA REPAIR UMBILICAL ADULT/INC;  Surgeon: Harl Bowie, MD;  Location: [place];  Service: General;  Laterality: N/A;  . Laparotomy N/A 01/28/2014    Procedure: EXPLORATORY LAPAROTOMY;  Surgeon: Harl Bowie, MD;  Location: [place];  Service: General;  Laterality: N/A;  . Bowel resection N/A 01/28/2014  Procedure: SMALL BOWEL RESECTION;  Surgeon: Harl Bowie, MD;  Location: [place];  Service: General;  Laterality: N/A;  . Hernia repair    . Colonoscopy     Family History  Problem Relation Age of Onset  . Stroke Mother     ?  Marland Kitchen Cancer Mother     unknown type of cancer  . Stroke Father     ?  Marland Kitchen Heart attack Neg Hx   . Cancer Brother 43    stomach cancer   History  Substance Use Topics  . Smoking status: Former Smoker -- 1.00 packs/day for 16 years    Quit date: 10/11/1968  . Smokeless tobacco: Never Used  . Alcohol Use: Yes     Comment: wild [place] daily;  also reports regularly drinking a pint of vodka. On 12/23 reports just occasionally, two drinks two days ago.    Review of Systems  Constitutional: Negative for fever.  HENT: Negative for trouble swallowing.   Eyes: Negative for  visual disturbance.  Respiratory: Negative for shortness of breath.   Cardiovascular: Negative for chest pain.  Gastrointestinal: Negative for nausea, vomiting and abdominal pain.  Genitourinary: Negative for dysuria.  Musculoskeletal: Negative for neck pain.       Ankle swelling  Skin: Negative for rash.  Neurological: Positive for weakness. Negative for dizziness and numbness.  Psychiatric/Behavioral: Negative.       Allergies  Erythromycin  Home Medications   Prior to Admission medications   Medication Sig Start Date End Date Taking? Authorizing Provider  allopurinol (ZYLOPRIM) 300 MG tablet Take 1 tablet (300 mg total) by mouth daily. 08/02/14  Yes Daniel J Angiulli, PA-C  aspirin EC 325 MG EC tablet Take 1 tablet (325 mg total) by mouth daily. 08/02/14  Yes Daniel J Angiulli, PA-C  atorvastatin (LIPITOR) 20 MG tablet Take 1 tablet (20 mg total) by mouth daily. 08/02/14  Yes Daniel J Angiulli, PA-C  carvedilol (COREG) 3.125 MG tablet Take 1 tablet (3.125 mg total) by mouth 2 (two) times daily with a meal. 08/02/14  Yes Daniel J Angiulli, PA-C  colchicine 0.6 MG tablet Take 1 tablet (0.6 mg total) by mouth 2 (two) times daily. 08/02/14  Yes Daniel J Angiulli, PA-C  FLUoxetine (PROZAC) 20 MG capsule Take 1 capsule (20 mg total) by mouth daily. 08/02/14  Yes Daniel J Angiulli, PA-C  Fluticasone Furoate 200 MCG/ACT AEPB Inhale 1 puff into the lungs 2 (two) times daily. 08/02/14  Yes Daniel J Angiulli, PA-C  folic acid (FOLVITE) 1 MG tablet Take 1 tablet (1 mg total) by mouth daily. 08/02/14  Yes Daniel J Angiulli, PA-C  furosemide (LASIX) 80 MG tablet Take 2 tablets in the AM and 1 in the PM Patient taking differently: Take 160 mg by mouth See admin instructions. Take to tablets in the morning and 1 tablet in the afternoon per daughter 08/29/14  Yes Burnell Blanks, MD  gabapentin (NEURONTIN) 300 MG capsule Take 1 capsule (300 mg total) by mouth 2 (two) times daily. 08/02/14   Yes Daniel J Angiulli, PA-C  insulin glargine (LANTUS) 100 UNIT/ML injection Inject 0.08 mLs (8 Units total) into the skin at bedtime. Patient taking differently: Inject 12 Units into the skin at bedtime.  08/02/14  Yes Daniel J Angiulli, PA-C  lactulose (CHRONULAC) 10 GM/15ML solution Take 30 mLs (20 g total) by mouth 2 (two) times daily. 08/02/14  Yes Daniel J Angiulli, PA-C  levothyroxine (SYNTHROID, LEVOTHROID) 125 MCG tablet Take 1.5 tablets (187  mcg total) by mouth daily before breakfast. 08/02/14  Yes Daniel J Angiulli, PA-C  metolazone (ZAROXOLYN) 5 MG tablet Take 1 tablet (5 mg total) by mouth once a week. Take on Friday  and as needed. Take with an extra 20 meq of potassium Patient taking differently: Take 5 mg by mouth once a week. On Friday 08/08/14  Yes Amy D Clegg, NP  Multiple Vitamin (MULTIVITAMIN WITH MINERALS) TABS tablet Take 1 tablet by mouth daily.   Yes Historical Provider, MD  omeprazole (PRILOSEC) 20 MG capsule Take 1 capsule (20 mg total) by mouth daily. 08/02/14  Yes Daniel J Angiulli, PA-C  oxyCODONE 10 MG TABS Take 1 tablet (10 mg total) by mouth every 6 (six) hours as needed for severe pain. 08/02/14  Yes Daniel J Angiulli, PA-C  potassium chloride SA (K-DUR,KLOR-CON) 20 MEQ tablet Take 3 tablets (60 mEq total) by mouth daily. 09/13/14  Yes Burnell Blanks, MD  tamoxifen (NOLVADEX) 20 MG tablet Take 1 tablet (20 mg total) by mouth daily. 09/09/14  Yes Rulon Eisenmenger, MD   BP 132/75 mmHg  Pulse 66  Temp(Src) 98.4 F (36.9 C) (Oral)  Resp 18  Ht 5' 9.5" (1.765 m)  Wt 230 lb 6.1 oz (104.5 kg)  BMI 33.54 kg/m2  SpO2 100% Physical Exam  Constitutional: He is oriented to person, place, and time. He appears well-developed and well-nourished. No distress.  HENT:  Head: Normocephalic and atraumatic.  Mouth/Throat: Oropharynx is clear and moist. No oropharyngeal exudate.  Eyes: Right eye exhibits no discharge. Left eye exhibits no discharge. No scleral icterus.   Neck: Normal range of motion.  Cardiovascular: Normal rate and normal pulses.  An irregularly irregular rhythm present.  Murmur heard.  Systolic murmur is present with a grade of 2/6  Pulses:      Radial pulses are 2+ on the right side, and 2+ on the left side.       Dorsalis pedis pulses are 2+ on the right side, and 2+ on the left side.  Pulmonary/Chest: Effort normal. No accessory muscle usage. No respiratory distress. He has rales in the right lower field and the left lower field.  Abdominal: Soft. There is no tenderness.  Musculoskeletal: Normal range of motion. He exhibits no edema or tenderness.  Lymphadenopathy:  Bilateral pedal edema 2+  Neurological: He is alert and oriented to person, place, and time. No cranial nerve deficit. Coordination normal.  Skin: Skin is warm and dry. No rash noted. He is not diaphoretic.  Psychiatric: He has a normal mood and affect.  Nursing note and vitals reviewed.   ED Course  Procedures (including critical care time) Labs Review Labs Reviewed  CBC WITH DIFFERENTIAL - Abnormal; Notable for the following:    RBC 3.95 (*)    Hemoglobin 12.3 (*)    HCT 38.2 (*)    RDW 17.2 (*)    Platelets 110 (*)    Neutrophils Relative % 82 (*)    Lymphocytes Relative 10 (*)    All other components within normal limits  COMPREHENSIVE METABOLIC PANEL - Abnormal; Notable for the following:    Chloride 94 (*)    Glucose, Bld 128 (*)    BUN 60 (*)    Calcium 8.2 (*)    Albumin 3.3 (*)    GFR calc non Af Amer 52 (*)    GFR calc Af Amer 60 (*)    All other components within normal limits  BRAIN NATRIURETIC PEPTIDE - Abnormal; Notable for  the following:    B Natriuretic Peptide 337.7 (*)    All other components within normal limits  URINALYSIS, ROUTINE W REFLEX MICROSCOPIC    Imaging Review Dg Chest 2 View  10/02/2014   CLINICAL DATA:  Shortness of breath, bilateral lower extremity pain and swelling  EXAM: CHEST  2 VIEW  COMPARISON:  08/28/2014 chest  CT, 07/21/2014 chest radiograph  FINDINGS: Mild cardiomegaly noted. Lungs are hypoaerated with crowding of the bronchovascular markings. Patchy left lower lobe airspace opacity is identified. No pleural effusion. No acute osseous finding. Lower thoracic kyphosis reidentified.  IMPRESSION: Low volume exam with patchy bilateral lower lobe airspace opacities, likely atelectasis although early pneumonia could appear similar. If symptoms persist, consider PA and lateral chest radiographs obtained at full inspiration when the patient is clinically able.   Electronically Signed   By: Conchita Paris M.D.   On: 10/02/2014 20:07     EKG Interpretation None      MDM   Final diagnoses:  Generalized weakness   Patient here with generalized weakness since this afternoon. Patient states he has weakness at baseline, and typically walks with a walker in his house. Patient states today he has been unable to get up to use his walker at all. Patient states he has been unable to walk without assistance today. He reports one episode of dyspnea on exertion while trying to stand up, however denies having any dyspnea since. Patient history of CHF with last echo 04/2014 at 50-55%. Patient also had recent adjustment of his home Lasix and a decrease in the dosage due to elevated creatinine by his PCP. No focal neurologic deficit on exam. Patient well-appearing, non-tachycardic, nontachypneic, non-hypoxic and in no acute distress. Concern for possible exacerbation or worsening of CHF. Patient appears mildly fluid overloaded on exam with bilateral pedal edema which patient states is mildly worse than usual, and bibasilar crackles on lung exam. Patient came in to the ER on 2 L of O2 by nasal cannula and is in the high 90s for oxygen saturation. Patient taken off of oxygen while in the ER, and maintain saturations in the high 90s without any dyspnea or tachypnea.  Lab results with elevated BNP, BUN and creatinine appear to be  preserved based on results from last month.  Chest x-ray with impression: Low volume exam with patchy bilateral lower lobe airspace opacities, likely atelectasis although early pneumonia could appear similar. If symptoms persist, consider PA and lateral chest radiographs obtained at full inspiration when the patient is clinically able.  In correlation with clinical presentation, I believe this radiograph could be a sign of patient's fluid overload. We'll start patient on 1.5 times home dose of Lasix at 120 mg IV. Patient does have his daughter who lives at home with him for assistance, however patient states he feels extremely uncomfortable going home given his current state of weakness generally. Hospitalists consult for admission for generalized weakness and possible worsening of heart failure.  Hospitalist recommend patient admission to telemetry.The patient appears reasonably stabilized for admission considering the current resources, flow, and capabilities available in the ED at this time, and I doubt any other [facility] requiring further screening and/or treatment in the ED prior to admission.  BP 132/75 mmHg  Pulse 66  Temp(Src) 98.4 F (36.9 C) (Oral)  Resp 18  Ht 5' 9.5" (1.765 m)  Wt 230 lb 6.1 oz (104.5 kg)  BMI 33.54 kg/m2  SpO2 100%  Signed,  Dahlia Bailiff, PA-C 11:39 PM  Pt seen  and discussed with Dr. Carmin Muskrat, MD   Carrie Mew, PA-C 10/02/14 [place], MD 10/02/14 2350

## 2014-10-02 NOTE — ED Notes (Signed)
Report attempted 

## 2014-10-02 NOTE — H&P (Addendum)
[facility] History and Physical  [name]:[id number] DOB: [birth year] DOA: 10/02/2014  Referring physician: ER physician. PCP: Jene Every, MD   Chief Complaint: Shortness of breath and left leg pain.  HPI: [name] is a 68 y.o. male with history of diastolic CHF last EF measured this year was 50-55%, CAD status post CABG, diabetes mellitus, chronic atrial fibrillation, chronic kidney disease presents to the ER because of shortness of breath and left leg pain. Patient states that he felt diaphoretic around evening and also short of breath and had some increasing left lower extremity pain which made him very weak to walk. In the ER patient chest x-ray shows congestion versus infiltrates. On exam patient is a increasing left lower extremity edema with bilateral lower extremity erythema. Patient was given IV Lasix and admitted for further management of CHF. Patient denies any chest pain nausea vomiting abdominal pain and diarrhea. 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   Review of Systems: As presented in the history of presenting illness, rest negative.  Past Medical History  Diagnosis Date  . CHRONIC OBSTRUCTIVE PULMONARY DISEASE 06/20/2009  . CAROTID STENOSIS 06/20/2009    A. 08/2001 s/p L CEA;  B.   09/14/11 - Carotid U/S - 40-59% bilateral stenosis, left CEA patch angioplasty is patent  . DM 06/20/2009  . CAD 06/20/2009    A.  08/2000 - s/p CABG x 4 - LIMA-LAD, Left Radial-OM, VG-DIAG, VG-RCA;  B. Neg. MV  2010  . HYPERLIPIDEMIA 06/20/2009  . HYPERTENSION 06/20/2009  . Hypothyroidism   . Low back pain   . Asthma     as child  . Pneumonia   . Atrial fibrillation     Not felt to be coumadin candidate 2/2 ETOH use.  Marland Kitchen History of tobacco abuse     remote - quit 1970  . Bilateral renal cysts   . Morbidly obese   . CHF (congestive heart failure)   . Falls frequently   . Hx of cardiovascular stress test 05/2014    Lexiscan Myoview (8/15):  No  ischemia; EF 63% - Normal Study  . Breast cancer, right breast   . Gout   . OBSTRUCTIVE SLEEP APNEA 06/20/2009    does not use cpap  . Stroke     found on MRI in Oct. 2015.   Marland Kitchen Shortness of breath dyspnea     occasional  . Depression     takes Prozac  . Kidney disease     Stage 3  . GERD (gastroesophageal reflux disease)     takes Prilosec  . Arthritis   . Neuromuscular disorder     neuropathy in both legs  . Constipation   . ETOH abuse     no alcohol since Oct. 2015   Past Surgical History  Procedure Laterality Date  . Carotid endarterectomy  2002    left  . Coronary artery bypass graft      x 4 - 2001  . Umbilical hernia repair N/A 01/28/2014    Procedure: HERNIA REPAIR UMBILICAL ADULT/INC;  Surgeon: Harl Bowie, MD;  Location: [place];  Service: General;  Laterality: N/A;  . Laparotomy N/A 01/28/2014    Procedure: EXPLORATORY LAPAROTOMY;  Surgeon: Harl Bowie, MD;  Location: [place];  Service: General;  Laterality: N/A;  . Bowel resection N/A 01/28/2014    Procedure: SMALL BOWEL RESECTION;  Surgeon: Harl Bowie, MD;  Location: [place];  Service: General;  Laterality: N/A;  . Hernia  repair    . Colonoscopy     Social History:  reports that he quit smoking about 46 years ago. He has never used smokeless tobacco. He reports that he drinks alcohol. He reports that he uses illicit drugs (Marijuana). Where does patient live home. Can patient participate in ADLs? Yes.  Allergies  Allergen Reactions  . Erythromycin Hives    Family History:  Family History  Problem Relation Age of Onset  . Stroke Mother     ?  Marland Kitchen Cancer Mother     unknown type of cancer  . Stroke Father     ?  Marland Kitchen Heart attack Neg Hx   . Cancer Brother 70    stomach cancer      Prior to Admission medications   Medication Sig Start Date End Date Taking? Authorizing Provider  allopurinol (ZYLOPRIM) 300 MG tablet Take 1 tablet (300 mg total) by mouth daily. 08/02/14  Yes Daniel J Angiulli,  PA-C  aspirin EC 325 MG EC tablet Take 1 tablet (325 mg total) by mouth daily. 08/02/14  Yes Daniel J Angiulli, PA-C  atorvastatin (LIPITOR) 20 MG tablet Take 1 tablet (20 mg total) by mouth daily. 08/02/14  Yes Daniel J Angiulli, PA-C  carvedilol (COREG) 3.125 MG tablet Take 1 tablet (3.125 mg total) by mouth 2 (two) times daily with a meal. 08/02/14  Yes Daniel J Angiulli, PA-C  colchicine 0.6 MG tablet Take 1 tablet (0.6 mg total) by mouth 2 (two) times daily. 08/02/14  Yes Daniel J Angiulli, PA-C  FLUoxetine (PROZAC) 20 MG capsule Take 1 capsule (20 mg total) by mouth daily. 08/02/14  Yes Daniel J Angiulli, PA-C  Fluticasone Furoate 200 MCG/ACT AEPB Inhale 1 puff into the lungs 2 (two) times daily. 08/02/14  Yes Daniel J Angiulli, PA-C  folic acid (FOLVITE) 1 MG tablet Take 1 tablet (1 mg total) by mouth daily. 08/02/14  Yes Daniel J Angiulli, PA-C  furosemide (LASIX) 80 MG tablet Take 2 tablets in the AM and 1 in the PM Patient taking differently: Take 160 mg by mouth See admin instructions. Take to tablets in the morning and 1 tablet in the afternoon per daughter 08/29/14  Yes Burnell Blanks, MD  gabapentin (NEURONTIN) 300 MG capsule Take 1 capsule (300 mg total) by mouth 2 (two) times daily. 08/02/14  Yes Daniel J Angiulli, PA-C  insulin glargine (LANTUS) 100 UNIT/ML injection Inject 0.08 mLs (8 Units total) into the skin at bedtime. Patient taking differently: Inject 12 Units into the skin at bedtime.  08/02/14  Yes Daniel J Angiulli, PA-C  lactulose (CHRONULAC) 10 GM/15ML solution Take 30 mLs (20 g total) by mouth 2 (two) times daily. 08/02/14  Yes Daniel J Angiulli, PA-C  levothyroxine (SYNTHROID, LEVOTHROID) 125 MCG tablet Take 1.5 tablets (187 mcg total) by mouth daily before breakfast. 08/02/14  Yes Daniel J Angiulli, PA-C  metolazone (ZAROXOLYN) 5 MG tablet Take 1 tablet (5 mg total) by mouth once a week. Take on Friday  and as needed. Take with an extra 20 meq of  potassium Patient taking differently: Take 5 mg by mouth once a week. On Friday 08/08/14  Yes Amy D Clegg, NP  Multiple Vitamin (MULTIVITAMIN WITH MINERALS) TABS tablet Take 1 tablet by mouth daily.   Yes Historical Provider, MD  omeprazole (PRILOSEC) 20 MG capsule Take 1 capsule (20 mg total) by mouth daily. 08/02/14  Yes Daniel J Angiulli, PA-C  oxyCODONE 10 MG TABS Take 1 tablet (10 mg total) by mouth  every 6 (six) hours as needed for severe pain. 08/02/14  Yes Daniel J Angiulli, PA-C  potassium chloride SA (K-DUR,KLOR-CON) 20 MEQ tablet Take 3 tablets (60 mEq total) by mouth daily. 09/13/14  Yes Burnell Blanks, MD  tamoxifen (NOLVADEX) 20 MG tablet Take 1 tablet (20 mg total) by mouth daily. 09/09/14  Yes Rulon Eisenmenger, MD    Physical Exam: Filed Vitals:   10/02/14 2115 10/02/14 2130 10/02/14 2145 10/02/14 2320  BP: 123/68 118/63 105/74 132/75  Pulse: 76 73 75 66  Temp:    98.4 F (36.9 C)  TempSrc:    Oral  Resp: 17 16 21 18   Height:      Weight:    104.5 kg (230 lb 6.1 oz)  SpO2: 98% 98% 95% 100%     General:  Well built and nourished.  Eyes: Anicteric no pallor.  ENT: No discharge from the ears eyes nose and mouth.  Neck: No mass felt. JVD not appreciated.  Cardiovascular: S1-S2 heard.  Respiratory: No rhonchi or crepitations.  Abdomen: Intracutaneous fistula seen. No active discharge.  Skin: Fistula in the abdomen.  Musculoskeletal: Left lower extremity is more swollen than the right lower extremity with bilateral lower extremity erythema.  Psychiatric: Appears normal.  Neurologic: Alert awake oriented to time place and person. Moves all extremities.  Labs on Admission:  Basic Metabolic Panel:  Recent Labs Lab 10/02/14 1825  NA 136  K 3.5  CL 94*  CO2 30  GLUCOSE 128*  BUN 60*  CREATININE 1.35  CALCIUM 8.2*   Liver Function Tests:  Recent Labs Lab 10/02/14 1825  AST 25  ALT 15  ALKPHOS 68  BILITOT 0.7  PROT 7.0  ALBUMIN 3.3*    No results for input(s): LIPASE, AMYLASE in the last 168 hours. No results for input(s): AMMONIA in the last 168 hours. CBC:  Recent Labs Lab 10/02/14 1825  WBC 6.9  NEUTROABS 5.7  HGB 12.3*  HCT 38.2*  MCV 96.7  PLT 110*   Cardiac Enzymes: No results for input(s): CKTOTAL, CKMB, CKMBINDEX, TROPONINI in the last 168 hours.  BNP (last 3 results)  Recent Labs  04/20/14 1655 06/11/14 1624 07/21/14 1300  PROBNP 17870.0* 4440.0* 3542.0*   CBG: No results for input(s): GLUCAP in the last 168 hours.  Radiological Exams on Admission: Dg Chest 2 View  10/02/2014   CLINICAL DATA:  Shortness of breath, bilateral lower extremity pain and swelling  EXAM: CHEST  2 VIEW  COMPARISON:  08/28/2014 chest CT, 07/21/2014 chest radiograph  FINDINGS: Mild cardiomegaly noted. Lungs are hypoaerated with crowding of the bronchovascular markings. Patchy left lower lobe airspace opacity is identified. No pleural effusion. No acute osseous finding. Lower thoracic kyphosis reidentified.  IMPRESSION: Low volume exam with patchy bilateral lower lobe airspace opacities, likely atelectasis although early pneumonia could appear similar. If symptoms persist, consider PA and lateral chest radiographs obtained at full inspiration when the patient is clinically able.   Electronically Signed   By: Conchita Paris M.D.   On: 10/02/2014 20:07    EKG: Independently reviewed. A. fib with controlled rate. Nonspecific ST-T changes.  Assessment/Plan Principal Problem:   Acute on chronic systolic and diastolic heart failure, NYHA class 4 Active Problems:   Cancer of male breast, right   Lower extremity pain, left   Diabetes mellitus type 2, controlled   1. Decompensated diastolic CHF last EF measured was 50-55% this year - patient has been placed on Lasix 120 mg IV every 12  hourly. Closely follow intake output metabolic panel and daily weights. In July of this year patient did have to have dialysis briefly due to  worsening renal function at that time. Since there was concern for possible pneumonia patient is already on doxycycline for #2. Consult physical therapy. 2. Left leg pain and swelling - may be due to edema. But we will check Dopplers to rule out DVT and also since patient has erythema patient has been placed on doxycycline. 3. CAD status post CABG - denies any chest pain. Cycle cardiac markers. Continue antiplatelet agents. 4. Chronic atrial fibrillation - rate is controlled. Patient was felt not a candidate for anticoagulation but continue antiplatelet agents. 5. Chronic kidney disease stage III - closely follow intake and output metabolic panel. 6. Diabetes mellitus type 2 - continue Lantus with sliding scale coverage. 7. Recently diagnosed cancer of the right breast - per oncologist. 8. 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 9. Thrombocytopenia and anemia - follow CBC. 10. Recent stroke - continue antiplatelet agents.   DVT Prophylaxis Lovenox.  Code Status: Full code.  Family Communication: None.  Disposition Plan: Admit to inpatient.    KAKRAKANDY,ARSHAD N. [facility] Pager [phone].  If 7PM-7AM, please contact night-coverage [email or web address] Password [facility] 10/02/2014, 11:52 PM

## 2014-10-02 NOTE — ED Notes (Signed)
Reports normal level of health until today; feeling short of breath with any activity. Uses 2.5L O2 at night usually; put on [place] at 4L when started feeling like he couldn't breathe well. Denies cough or cold symptoms. Denies any increase in daily weights. Reports bilateral leg swelling, pain and heaviness.

## 2014-10-03 ENCOUNTER — Encounter (HOSPITAL_COMMUNITY): Payer: Self-pay | Admitting: Cardiology

## 2014-10-03 DIAGNOSIS — E876 Hypokalemia: Secondary | ICD-10-CM

## 2014-10-03 DIAGNOSIS — I110 Hypertensive heart disease with heart failure: Secondary | ICD-10-CM

## 2014-10-03 DIAGNOSIS — I5043 Acute on chronic combined systolic (congestive) and diastolic (congestive) heart failure: Secondary | ICD-10-CM

## 2014-10-03 DIAGNOSIS — I480 Paroxysmal atrial fibrillation: Secondary | ICD-10-CM

## 2014-10-03 DIAGNOSIS — R531 Weakness: Secondary | ICD-10-CM

## 2014-10-03 DIAGNOSIS — M79605 Pain in left leg: Secondary | ICD-10-CM

## 2014-10-03 DIAGNOSIS — I509 Heart failure, unspecified: Secondary | ICD-10-CM

## 2014-10-03 DIAGNOSIS — R0602 Shortness of breath: Secondary | ICD-10-CM

## 2014-10-03 LAB — BASIC METABOLIC PANEL
Anion gap: 12 (ref 5–15)
BUN: 55 mg/dL — AB (ref 6–23)
CO2: 30 mmol/L (ref 19–32)
Calcium: 8.3 mg/dL — ABNORMAL LOW (ref 8.4–10.5)
Chloride: 93 mEq/L — ABNORMAL LOW (ref 96–112)
Creatinine, Ser: 1.15 mg/dL (ref 0.50–1.35)
GFR calc Af Amer: 73 mL/min — ABNORMAL LOW (ref >90)
GFR, EST NON AFRICAN AMERICAN: 63 mL/min — AB (ref >90)
GLUCOSE: 158 mg/dL — AB (ref 70–99)
POTASSIUM: 3.7 mmol/L (ref 3.5–5.1)
Sodium: 135 mmol/L (ref 135–145)

## 2014-10-03 LAB — COMPREHENSIVE METABOLIC PANEL
ALBUMIN: 2.9 g/dL — AB (ref 3.5–5.2)
ALT: 14 U/L (ref 0–53)
AST: 23 U/L (ref 0–37)
Alkaline Phosphatase: 60 U/L (ref 39–117)
Anion gap: 10 (ref 5–15)
BUN: 58 mg/dL — ABNORMAL HIGH (ref 6–23)
CHLORIDE: 92 meq/L — AB (ref 96–112)
CO2: 30 mmol/L (ref 19–32)
Calcium: 7.9 mg/dL — ABNORMAL LOW (ref 8.4–10.5)
Creatinine, Ser: 1.27 mg/dL (ref 0.50–1.35)
GFR calc Af Amer: 64 mL/min — ABNORMAL LOW (ref >90)
GFR calc non Af Amer: 56 mL/min — ABNORMAL LOW (ref >90)
Glucose, Bld: 178 mg/dL — ABNORMAL HIGH (ref 70–99)
Potassium: 2.8 mmol/L — ABNORMAL LOW (ref 3.5–5.1)
Sodium: 132 mmol/L — ABNORMAL LOW (ref 135–145)
Total Bilirubin: 0.9 mg/dL (ref 0.3–1.2)
Total Protein: 6.3 g/dL (ref 6.0–8.3)

## 2014-10-03 LAB — CBC WITH DIFFERENTIAL/PLATELET
Basophils Absolute: 0 10*3/uL (ref 0.0–0.1)
Basophils Relative: 0 % (ref 0–1)
EOS PCT: 2 % (ref 0–5)
Eosinophils Absolute: 0.1 10*3/uL (ref 0.0–0.7)
HCT: 35 % — ABNORMAL LOW (ref 39.0–52.0)
HEMOGLOBIN: 11.2 g/dL — AB (ref 13.0–17.0)
LYMPHS PCT: 26 % (ref 12–46)
Lymphs Abs: 1.4 10*3/uL (ref 0.7–4.0)
MCH: 30.9 pg (ref 26.0–34.0)
MCHC: 32 g/dL (ref 30.0–36.0)
MCV: 96.7 fL (ref 78.0–100.0)
MONO ABS: 0.7 10*3/uL (ref 0.1–1.0)
Monocytes Relative: 13 % — ABNORMAL HIGH (ref 3–12)
Neutro Abs: 3.2 10*3/uL (ref 1.7–7.7)
Neutrophils Relative %: 59 % (ref 43–77)
Platelets: 112 10*3/uL — ABNORMAL LOW (ref 150–400)
RBC: 3.62 MIL/uL — ABNORMAL LOW (ref 4.22–5.81)
RDW: 17.2 % — ABNORMAL HIGH (ref 11.5–15.5)
WBC: 5.4 10*3/uL (ref 4.0–10.5)

## 2014-10-03 LAB — TROPONIN I
Troponin I: <0.03 ng/mL (ref <0.031)
Troponin I: 0.03 ng/mL (ref <0.031)
Troponin I: 0.03 ng/mL (ref <0.031)

## 2014-10-03 LAB — GLUCOSE, CAPILLARY
GLUCOSE-CAPILLARY: 125 mg/dL — AB (ref 70–99)
GLUCOSE-CAPILLARY: 113 mg/dL — AB (ref 70–99)
Glucose-Capillary: 191 mg/dL — ABNORMAL HIGH (ref 70–99)

## 2014-10-03 LAB — TSH: TSH: 1.941 u[IU]/mL (ref 0.350–4.500)

## 2014-10-03 LAB — URIC ACID: URIC ACID, SERUM: 5.8 mg/dL (ref 4.0–7.8)

## 2014-10-03 MED ORDER — DOXYCYCLINE HYCLATE 100 MG PO CAPS: 100.0000 mg | ORAL_CAPSULE | Freq: Two times a day (BID) | ORAL | Status: DC

## 2014-10-03 MED ORDER — POTASSIUM CHLORIDE 10 MEQ/100ML IV SOLN
10.0000 meq | INTRAVENOUS | Status: AC
  Administered 2014-10-03: 10 meq via INTRAVENOUS
  Filled 2014-10-03 (×3): qty 100

## 2014-10-03 MED ORDER — FLUTICASONE PROPIONATE HFA 220 MCG/ACT IN AERO
1.0000 | INHALATION_SPRAY | Freq: Two times a day (BID) | RESPIRATORY_TRACT | Status: DC
  Administered 2014-10-03 (×2): 1 via RESPIRATORY_TRACT
  Filled 2014-10-03: qty 12

## 2014-10-03 MED ORDER — POTASSIUM CHLORIDE CRYS ER 20 MEQ PO TBCR: 40.0000 meq | EXTENDED_RELEASE_TABLET | Freq: Once | ORAL | Status: DC

## 2014-10-03 MED ORDER — POTASSIUM CHLORIDE 10 MEQ/100ML IV SOLN: 10.0000 meq | INTRAVENOUS | Status: DC

## 2014-10-03 NOTE — Progress Notes (Signed)
Pt.is A/ox4 and is ambulatory with 1 person assist. Pt.'s K+ level was low this am and he received po K+ to supplement. Pt.had order for discharge today placed. Called attending physician to ask about orders for K+ runs and was told it was okay to give pt.1 run of K+. Have BMP drawn and notify of K+ level. Depending on K+ level patient he would be discharged today. Orders for discharge in place. Was told by attending MD that it was okay to hold dose evening dose of IVPB Lasix and  patient can receive dose of his po lasix tonight at home. Informed patient's daughter [name] of plan. Pt.'s daughter said she would be arriving to take patient home after discharge.

## 2014-10-03 NOTE — Progress Notes (Signed)
Utilization review completed. Oslo Huntsman, RN, BSN. 

## 2014-10-03 NOTE — Progress Notes (Signed)
Patient ID: [name]  male  UYQ:[id number]    DOB: 13-Oct-1945    DOA: 10/02/2014  PCP: [name] Every, MD  Brief history of present illness Patient is a 68 year old male with a diastolic CHF, EF 63-87%, CAD, status post CABG, diabetes, chronic A. fib, CAD presented to ED with shortness of breath and left leg pain. Patient reported that he felt diaphoretic, still dyspneic and had some increasing left lower extremity pain which made him very weak to walk. Chest x-ray in ED showed congestion versus infiltrates. On exam, patient also has increasing left lower activity edema with bilateral lower extremity erythema. Patient was given IV Lasix and admitted for further management of CHF and cellulitis.  Assessment/Plan: Principal Problem:   Acute on chronic systolic and diastolic heart failure, NYHA class 4 - Continue strict I's and O's and daily weights, placed on IV diuresis - Cardiology consulted, followed by Dr. Angelena Form in office - Also on metolazone weekly outpatient  Active Problems: Hypokalemia - place on IV replacement, daily oral replacement, recheck BMET later   Left leg pain, swelling, cellulitis Patient is placed on IV doxycycline, rule out DVT  CAD status post CABG - Denies any chest pain, troponins negative so far - Continue aspirin, statin, Coreg  Chronic kidney disease stage III - monitor closely with IV diuresis  Type 2 diabetes mellitus Continue Lantus, sliding scale insulin  enterocutaneous fistula -No active discharge  Recently diagnosed right breast cancer: Per oncology outpatient - On tamoxifen  Anemia, thrombocytopenia Follow CBC closely  Recent CVA  -Continue aspirin 325 mg daily, statin   DVT Prophylaxis: Lovenox  Code Status: Full CODE STATUS  Family Communication:  Disposition:  Consultants:Cardiology  Procedures   none  Antibiotics:  doxycycline    Subjective:Patient seen and examined, feels better  today   Objective: Weight change:   Intake/Output Summary (Last 24 hours) at 10/03/14 1210 Last data filed at 10/03/14 1135  Gross per 24 hour  Intake   1333 ml  Output   1425 ml  Net    -92 ml   Blood pressure 124/60, pulse 68, temperature 97.1 F (36.2 C), temperature source Oral, resp. rate 18, height 5' 9.5" (1.765 m), weight 104.2 kg (229 lb 11.5 oz), SpO2 95 %.  Physical Exam: General: Alert and awake, oriented x3, not in any acute distress. CIrregularly irregular, 2/6 SEM Chest: Decreased breath sounds at the bases Abdomen: soft nontender, nondistended, normal bowel sounds  Extremities: no cyanosis, clubbing.bilateral lower extremity edema with erythema, LLE>rt   Lab Results: Basic Metabolic Panel:  Recent Labs Lab 10/02/14 1825 10/03/14 0400  NA 136 132*  K 3.5 2.8*  CL 94* 92*  CO2 30 30  GLUCOSE 128* 178*  BUN 60* 58*  CREATININE 1.35 1.27  CALCIUM 8.2* 7.9*   Liver Function Tests:  Recent Labs Lab 10/02/14 1825 10/03/14 0400  AST 25 23  ALT 15 14  ALKPHOS 68 60  BILITOT 0.7 0.9  PROT 7.0 6.3  ALBUMIN 3.3* 2.9*   No results for input(s): LIPASE, AMYLASE in the last 168 hours. No results for input(s): AMMONIA in the last 168 hours. CBC:  Recent Labs Lab 10/02/14 1825 10/03/14 0400  WBC 6.9 5.4  NEUTROABS 5.7 3.2  HGB 12.3* 11.2*  HCT 38.2* 35.0*  MCV 96.7 96.7  PLT 110* 112*   Cardiac Enzymes:  Recent Labs Lab 10/03/14 0009 10/03/14 0400 10/03/14 1050  TROPONINI <0.03 0.03 0.03   BNP: Invalid input(s): POCBNP CBG:  Recent Labs  Lab 10/02/14 2327 10/03/14 0614 10/03/14 1057  GLUCAP 191* 125* 191*     Micro Results: No results found for this or any previous visit (from the past 240 hour(s)).  Studies/Results: Dg Chest 2 View  10/02/2014   CLINICAL DATA:  Shortness of breath, bilateral lower extremity pain and swelling  EXAM: CHEST  2 VIEW  COMPARISON:  08/28/2014 chest CT, 07/21/2014 chest radiograph  FINDINGS: Mild  cardiomegaly noted. Lungs are hypoaerated with crowding of the bronchovascular markings. Patchy left lower lobe airspace opacity is identified. No pleural effusion. No acute osseous finding. Lower thoracic kyphosis reidentified.  IMPRESSION: Low volume exam with patchy bilateral lower lobe airspace opacities, likely atelectasis although early pneumonia could appear similar. If symptoms persist, consider PA and lateral chest radiographs obtained at full inspiration when the patient is clinically able.   Electronically Signed   By: Conchita Paris M.D.   On: 10/02/2014 20:07    Medications: Scheduled Meds: . allopurinol  300 mg Oral Daily  . aspirin EC  325 mg Oral Daily  . atorvastatin  20 mg Oral Daily  . carvedilol  3.125 mg Oral BID WC  . colchicine  0.6 mg Oral BID  . doxycycline (VIBRAMYCIN) IV  100 mg Intravenous Q12H  . enoxaparin (LOVENOX) injection  40 mg Subcutaneous Q24H  . FLUoxetine  20 mg Oral Daily  . fluticasone  1 puff Inhalation BID  . folic acid  1 mg Oral Daily  . furosemide  120 mg Intravenous Q12H  . gabapentin  300 mg Oral BID  . insulin aspart  0-9 Units Subcutaneous TID WC  . insulin glargine  8 Units Subcutaneous QHS  . lactulose  20 g Oral BID  . levothyroxine  187 mcg Oral QAC breakfast  . [START ON 10/04/2014] metolazone  5 mg Oral Weekly  . multivitamin with minerals  1 tablet Oral Daily  . pantoprazole  40 mg Oral Daily  . potassium chloride  10 mEq Intravenous Q1 Hr x 3  . potassium chloride SA  60 mEq Oral Daily  . tamoxifen  20 mg Oral Daily      LOS: 1 day   [name] M.D. [facility] 10/03/2014, 12:10 PM Pager: [phone]  If 7PM-7AM, please contact night-coverage [email or web address] Password [email or web address]

## 2014-10-03 NOTE — Progress Notes (Signed)
Bilateral lower extremity venous duplex completed:  No evidence of DVT, superficial thrombosis, or Baker's cyst.   

## 2014-10-03 NOTE — Consult Note (Signed)
CARDIOLOGY CONSULTATION NOTE.  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          He was admitted to [facility] 04/14/13 through 04/27/13, and in Nov 2015 with volume overload. He had sluggish diuresis with IV lasix and he was transitioned to Lasix drip and dopamine drip at 3 mcg per hour. Diuresis was sluggish until diamox added with marked improvement. Had a CVA while in rehab in October 2015. He was seen in the CHF clinic 08/08/14 and was stable. Cleared for mastectomy at this visit. Stress myoview August 2015 without ischemia.Last OV wgt 236 09/09/14.  He was admitted last night b/c he felt quite weak & fatigued  -sweaty. He was concerned that he was having a recurrent stroke.  Was unable to walk - both legs felt weak & unable to support him. He feels better now.  He presented to the ER last PM feeling weak & SOB with increasing LEE edema & diaphoresis. Admitted by [facility] for CHF management.   Denies CP, N/V, abd discomfort or diarrhea. Cardiovascular ROS: positive for - dyspnea on exertion, edema and  not worse than baseline. negative for - chest pain, irregular heartbeat, loss of consciousness, murmur, orthopnea, palpitations, paroxysmal nocturnal dyspnea, rapid heart rate or Syncope/Near Syncope, TIA/Amaurosis Fugax No claudication. - not worse than baseline.  PMHx:  CARDIAC HISTORY: Echo:  04/2014: noted below Cath:  08/2000: Severe 3 V Dz: pLAD 70%, mLAD 80%, 80% Diag, 70%  pCx & 90% PLB, 80% dRCA CABG: 08/2000 LIMA-LAD, LRas-OM, SVG-Diag, SVG-dRCA,   NST:  05/2014  -  EF 63%, Nl LV Fxn & WM; NO ISCHEMIA-INFARCT  Past Medical History  Diagnosis Date  . CHRONIC OBSTRUCTIVE PULMONARY DISEASE 06/20/2009  . CAROTID STENOSIS 06/20/2009    A. 08/2001 s/p L CEA;  B.   09/14/11 - Carotid U/S - 40-59% bilateral stenosis, left CEA patch angioplasty is patent  . DM 06/20/2009  . CAD 06/20/2009    A.  08/2000 - s/p CABG x 4 - LIMA-LAD, Left Radial-OM, VG-DIAG, VG-RCA;  B. Neg. MV  2010  . HYPERLIPIDEMIA 06/20/2009  . HYPERTENSION 06/20/2009  . Hypothyroidism   . Low back pain   . Asthma     as child  . Pneumonia   . Persistent atrial fibrillation     Not felt to be coumadin candidate 2/2 ETOH use.  Marland Kitchen History of tobacco abuse     remote - quit 1970  . Bilateral renal cysts   . Morbidly obese   . Chronic diastolic heart failure, NYHA class 3     Followed by CHF Clinic --> Echo 04/2014: EF 79-39%, Diastolic DysFxn - elevated LVEDP & LAP. Mod Dil LA & RA.  . Falls frequently   . Hx of cardiovascular stress test 05/2014    Lexiscan Myoview (8/15):  No ischemia; EF 63% - Normal Study  . Breast cancer, right breast   . Gout   . OBSTRUCTIVE  SLEEP APNEA 06/20/2009    does not use cpap  . Stroke     found on MRI in Oct. 2015.   Marland Kitchen Shortness of breath dyspnea     occasional  . Depression     takes Prozac  . Kidney disease     Stage 3  . GERD (gastroesophageal reflux disease)     takes Prilosec  . Arthritis   . Neuromuscular disorder     neuropathy in both legs  . Constipation   . ETOH abuse     no alcohol since Oct. 2015   Past Surgical History  Procedure Laterality Date  . Carotid endarterectomy  2002    left  . Coronary artery bypass graft      x 4 - 2001  . Umbilical hernia repair N/A 01/28/2014    Procedure: HERNIA REPAIR UMBILICAL ADULT/INC;  Surgeon: Harl Bowie, MD;  Location: [facility];  Service: General;  Laterality: N/A;  . Laparotomy N/A 01/28/2014     Procedure: EXPLORATORY LAPAROTOMY;  Surgeon: Harl Bowie, MD;  Location: [facility];  Service: General;  Laterality: N/A;  . Bowel resection N/A 01/28/2014    Procedure: SMALL BOWEL RESECTION;  Surgeon: Harl Bowie, MD;  Location: [facility];  Service: General;  Laterality: N/A;  . Hernia repair    . Colonoscopy      FAMHx: Family History  Problem Relation Age of Onset  . Stroke Mother     ?  Marland Kitchen Cancer Mother     unknown type of cancer  . Stroke Father     ?  Marland Kitchen Heart attack Neg Hx   . Cancer Brother 64    stomach cancer   SOCHx:  reports that he quit smoking about 46 years ago. He has never used smokeless tobacco. He reports that he drinks alcohol. He reports that he uses illicit drugs (Marijuana).  ALLERGIES: Allergies  Allergen Reactions  . Erythromycin Hives   ROS: A comprehensive review of systems was PERFORMED Review of Systems  Constitutional: Positive for malaise/fatigue (Felt weak - mostly in his legs yesterday -- unable to walk.  was worried he was having a CVA).       Felt very hot & sweaty -- heat was up in house to 96 deg  Respiratory: Positive for shortness of breath (But no more than usual).   Cardiovascular: Positive for leg swelling (chronic = no worse than usual). Negative for chest pain and orthopnea.  Gastrointestinal: Positive for diarrhea (somewhat loose stools, not diarrhea). Negative for blood in stool and melena.  Genitourinary: Negative for hematuria.  Musculoskeletal:       L>R Leg pain - chronic  Psychiatric/Behavioral: Negative for depression and memory loss. The patient is nervous/anxious.   All other systems reviewed and are negative.  HOME MEDICATIONS: Prescriptions prior to admission  Medication Sig Dispense Refill Last Dose  . allopurinol (ZYLOPRIM) 300 MG tablet Take 1 tablet (300 mg total) by mouth daily. 30 tablet 3 10/02/2014 at Unknown time  . aspirin EC 325 MG EC tablet Take 1 tablet (325 mg total) by mouth daily. 30 tablet 0  10/02/2014 at Unknown time  . atorvastatin (LIPITOR) 20 MG tablet Take 1 tablet (20 mg total) by mouth daily. 30 tablet 1 10/02/2014 at Unknown time  . carvedilol (COREG) 3.125 MG tablet Take 1 tablet (3.125 mg total) by mouth 2 (two) times daily with a meal. 60 tablet 5 10/02/2014 at 1000 am  . colchicine 0.6 MG tablet Take  1 tablet (0.6 mg total) by mouth 2 (two) times daily. 30 tablet 0 10/02/2014 at Unknown time  . FLUoxetine (PROZAC) 20 MG capsule Take 1 capsule (20 mg total) by mouth daily. 30 capsule 3 10/02/2014 at Unknown time  . Fluticasone Furoate 200 MCG/ACT AEPB Inhale 1 puff into the lungs 2 (two) times daily. 30 each 1 10/01/2014 at Unknown time  . folic acid (FOLVITE) 1 MG tablet Take 1 tablet (1 mg total) by mouth daily. 30 tablet 1 10/02/2014 at Unknown time  . furosemide (LASIX) 80 MG tablet Take 2 tablets in the AM and 1 in the PM (Patient taking differently: Take 160 mg by mouth See admin instructions. Take to tablets in the morning and 1 tablet in the afternoon per daughter) 90 tablet 5 10/02/2014 at Unknown time  . gabapentin (NEURONTIN) 300 MG capsule Take 1 capsule (300 mg total) by mouth 2 (two) times daily. 60 capsule 1 10/02/2014 at Unknown time  . insulin glargine (LANTUS) 100 UNIT/ML injection Inject 0.08 mLs (8 Units total) into the skin at bedtime. (Patient taking differently: Inject 12 Units into the skin at bedtime. ) 10 mL 11 10/02/2014 at Unknown time  . lactulose (CHRONULAC) 10 GM/15ML solution Take 30 mLs (20 g total) by mouth 2 (two) times daily. 240 mL 0 10/01/2014 at Unknown time  . levothyroxine (SYNTHROID, LEVOTHROID) 125 MCG tablet Take 1.5 tablets (187 mcg total) by mouth daily before breakfast. 30 tablet 3 10/02/2014 at Unknown time  . metolazone (ZAROXOLYN) 5 MG tablet Take 1 tablet (5 mg total) by mouth once a week. Take on Friday  and as needed. Take with an extra 20 meq of potassium (Patient taking differently: Take 5 mg by mouth once a week. On Friday)  12 tablet 6 09/27/2014  . Multiple Vitamin (MULTIVITAMIN WITH MINERALS) TABS tablet Take 1 tablet by mouth daily.   10/02/2014 at Unknown time  . omeprazole (PRILOSEC) 20 MG capsule Take 1 capsule (20 mg total) by mouth daily. 30 capsule 1 10/02/2014 at Unknown time  . oxyCODONE 10 MG TABS Take 1 tablet (10 mg total) by mouth every 6 (six) hours as needed for severe pain. 60 tablet 0 10/01/2014 at Unknown time  . potassium chloride SA (K-DUR,KLOR-CON) 20 MEQ tablet Take 3 tablets (60 mEq total) by mouth daily. 90 tablet 6 10/02/2014 at Unknown time  . tamoxifen (NOLVADEX) 20 MG tablet Take 1 tablet (20 mg total) by mouth daily. 90 tablet 3 10/02/2014 at Unknown time    HOSPITAL MEDICATIONS: Scheduled Meds: . allopurinol  300 mg Oral Daily  . aspirin EC  325 mg Oral Daily  . atorvastatin  20 mg Oral Daily  . carvedilol  3.125 mg Oral BID WC  . colchicine  0.6 mg Oral BID  . doxycycline (VIBRAMYCIN) IV  100 mg Intravenous Q12H  . enoxaparin (LOVENOX) injection  40 mg Subcutaneous Q24H  . FLUoxetine  20 mg Oral Daily  . fluticasone  1 puff Inhalation BID  . folic acid  1 mg Oral Daily  . furosemide  120 mg Intravenous Q12H  . gabapentin  300 mg Oral BID  . insulin aspart  0-9 Units Subcutaneous TID WC  . insulin glargine  8 Units Subcutaneous QHS  . levothyroxine  187 mcg Oral QAC breakfast  . [START ON 10/04/2014] metolazone  5 mg Oral Weekly  . multivitamin with minerals  1 tablet Oral Daily  . pantoprazole  40 mg Oral Daily  . potassium chloride  10  mEq Intravenous Q1 Hr x 3  . potassium chloride SA  60 mEq Oral Daily  . tamoxifen  20 mg Oral Daily   Continuous Infusions:  PRN Meds:.oxyCODONE  VITALS: Blood pressure 143/73, pulse 64, temperature 97.1 F (36.2 C), temperature source Oral, resp. rate 18, height 5' 9.5" (1.765 m), weight 229 lb 11.5 oz (104.2 kg), SpO2 95 %.  PHYSICAL EXAM: Physical Exam  Constitutional: He is oriented to person, place, and time. He appears  well-developed and well-nourished. No distress.  HENT:  Head: Normocephalic and atraumatic.  Mouth/Throat: Oropharynx is clear and moist. No oropharyngeal exudate.  Eyes: Conjunctivae and EOM are normal. Pupils are equal, round, and reactive to light. Left eye exhibits no discharge.  Neck: Normal range of motion. No JVD present.  Pulmonary/Chest: Effort normal and breath sounds normal. No respiratory distress. He has no wheezes.  Scattered crackles  Abdominal: Soft. Bowel sounds are normal. He exhibits no distension and no mass. There is no tenderness. There is no rebound and no guarding.  Musculoskeletal: Normal range of motion. He exhibits edema (~1+ bilateral LE edema).  Neurological: He is alert and oriented to person, place, and time. No cranial nerve deficit.  Skin: There is erythema (mild venous stasis erythema.).  Psychiatric: He has a normal mood and affect. Judgment and thought content normal.  Nursing note and vitals reviewed.   LABS: Reviewed in EPIC.  Trop <0.03, K 2.8, Cr 1.27,   H/H 12.3/38.2  No ProBNP   IMAGING: Dg Chest 2 View  10/02/2014   CLINICAL DATA:  Shortness of breath, bilateral lower extremity pain and swelling  EXAM: CHEST  2 VIEW  COMPARISON:  08/28/2014 chest CT, 07/21/2014 chest radiograph  FINDINGS: Mild cardiomegaly noted. Lungs are hypoaerated with crowding of the bronchovascular markings. Patchy left lower lobe airspace opacity is identified. No pleural effusion. No acute osseous finding. Lower thoracic kyphosis reidentified.  IMPRESSION: Low volume exam with patchy bilateral lower lobe airspace opacities, likely atelectasis although early pneumonia could appear similar. If symptoms persist, consider PA and lateral chest radiographs obtained at full inspiration when the patient is clinically able.   Electronically Signed   By: Conchita Paris M.D.   On: 10/02/2014 20:07   IMPRESSION: Principal Problem:   Weakness generalized Active Problems:    Hypertensive heart disease   PAF (paroxysmal atrial fibrillation)   Acute on chronic systolic and diastolic heart failure, NYHA class 3   Cancer of male breast, right   Lower extremity pain, left   Diabetes mellitus type 2, controlled   Hypokalemia  He actually y feels close to his baseline.  His weight is pretty much at his dry weight & the edema in minimal.  I doubt that this is a true CHF exacerbation.  He diuresed a bit, but is relatively stable at this point.  RECOMMENDATION:  Would replete potassium - was 2.8 this AM (he takes 3 K-dur pills daily @ home)  Convert back to home PO Lasix dose  Continue current Coreg dose - cannot titrate up with HR in 60s; Is not on ACE-I or ARB for afterload reduction due to intermittent renal failure -- defer to CHF clinic for considering BiDil.  He is probably OK to d/c from a cardiac standpoint after potassium repletion.   Time Spent Directly with Patient: 45 minutes  HARDING, Leonie Green, M.D., M.S. Interventional Cardiologist   Pager # [phone]

## 2014-10-03 NOTE — Discharge Summary (Signed)
Physician Discharge Summary  Patient ID: [name] MRN: [id number] DOB/AGE: [birth year] 68 y.o.  Admit date: 10/02/2014 Discharge date: 10/03/2014  Primary Care Physician:  Jene Every, MD  Discharge Diagnoses:    . Acute on chronic systolic and diastolic heart failure, NYHA class 4 . Hypokalemia  . left lower extremities cellulitis  . 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 type 2 diabetes mellitus  . Hypertensive heart disease . PAF (paroxysmal atrial fibrillation)  Consults: Cardiology, Dr. Ellyn Hack   Recommendations for Outpatient Follow-up:  Patient needs to have follow-up appointment with repeat BMET checked  TESTS THAT NEED FOLLOW-UP BMET   DIET: Carb modified diet    Allergies:   Allergies  Allergen Reactions  . Erythromycin Hives     Discharge Medications:   Medication List    TAKE these medications        allopurinol 300 MG tablet  Commonly known as:  ZYLOPRIM  Take 1 tablet (300 mg total) by mouth daily.     aspirin 325 MG EC tablet  Take 1 tablet (325 mg total) by mouth daily.     atorvastatin 20 MG tablet  Commonly known as:  LIPITOR  Take 1 tablet (20 mg total) by mouth daily.     carvedilol 3.125 MG tablet  Commonly known as:  COREG  Take 1 tablet (3.125 mg total) by mouth 2 (two) times daily with a meal.     colchicine 0.6 MG tablet  Take 1 tablet (0.6 mg total) by mouth 2 (two) times daily.     doxycycline 100 MG capsule  Commonly known as:  VIBRAMYCIN  Take 1 capsule (100 mg total) by mouth 2 (two) times daily. X 2 weeks     FLUoxetine 20 MG capsule  Commonly known as:  PROZAC  Take 1 capsule (20 mg total) by mouth daily.     Fluticasone Furoate 200 MCG/ACT Aepb  Inhale 1 puff into the lungs 2 (two) times daily.     folic acid 1 MG tablet  Commonly known as:  FOLVITE  Take 1 tablet (1 mg total) by mouth daily.     furosemide 80 MG tablet  Commonly known as:  LASIX  Take 2 tablets in the AM and 1 in the PM     gabapentin  300 MG capsule  Commonly known as:  NEURONTIN  Take 1 capsule (300 mg total) by mouth 2 (two) times daily.     insulin glargine 100 UNIT/ML injection  Commonly known as:  LANTUS  Inject 0.08 mLs (8 Units total) into the skin at bedtime.     lactulose 10 GM/15ML solution  Commonly known as:  CHRONULAC  Take 30 mLs (20 g total) by mouth 2 (two) times daily.     levothyroxine 125 MCG tablet  Commonly known as:  SYNTHROID, LEVOTHROID  Take 1.5 tablets (187 mcg total) by mouth daily before breakfast.     metolazone 5 MG tablet  Commonly known as:  ZAROXOLYN  Take 1 tablet (5 mg total) by mouth once a week. Take on Friday  and as needed. Take with an extra 20 meq of potassium     multivitamin with minerals Tabs tablet  Take 1 tablet by mouth daily.     omeprazole 20 MG capsule  Commonly known as:  PRILOSEC  Take 1 capsule (20 mg total) by mouth daily.     Oxycodone HCl 10 MG Tabs  Take 1 tablet (10 mg total) by mouth every 6 (six) hours as  needed for severe pain.     potassium chloride SA 20 MEQ tablet  Commonly known as:  K-DUR,KLOR-CON  Take 3 tablets (60 mEq total) by mouth daily.     tamoxifen 20 MG tablet  Commonly known as:  NOLVADEX  Take 1 tablet (20 mg total) by mouth daily.         Brief H and P: For complete details please refer to admission H and P, but in brief Patient is a 68 year old male with a diastolic CHF, EF 64-40%, CAD, status post CABG, diabetes, chronic A. fib, CAD presented to ED with shortness of breath and left leg pain. Patient reported that he felt diaphoretic, still dyspneic and had some increasing left lower extremity pain which made him very weak to walk. Chest x-ray in ED showed congestion versus infiltrates. On exam, patient also has increasing left lower activity edema with bilateral lower extremity erythema. Patient was given IV Lasix and admitted for further management of CHF and cellulitis.  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 Patient felt weak,fatigued and diaphoretic.  Acute on chronic systolic and diastolic heart failure, NYHA class 4 Patient was admitted to telemetry with telemetry, placed on aggressive IV diuresis with Lasix. Cardiology was consulted. Patient had remarkable improvement with IV Lasix. Per cardiology he was pretty much at his dry weight and recommended to convert back to her home by mouth Lasix dose, continue potassium replacement. Defer to CHF clinic for considering BiDil.   Hypokalemia -potassium of 2.8 on admission, placed on IV replacement, daily oral replacement of 58meq,  rechecked prior to discharge was 3.7.  Left leg pain, swelling, cellulitis Patient was placed on doxycycline for 2 weeks. Doppler ultrasound of the lower extremities was negative for any DVT.   CAD status post CABG Patient denied any chest pain, troponins negative so far. Continue aspirin, statin, Coreg.  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 HEENT: anicteric sclera, pupils reactive to light and accommodation CVS:  irregularly irregular, 2/6 SEM Chest: Decreased breath sounds at the bases Abdomen: soft nontender, nondistended,  normal bowel sounds Extremities: no cyanosis, clubbing. Bilateral lower extremity edema with erythema, LLE> Rt    The results of significant diagnostics from this hospitalization (including imaging, microbiology, ancillary and laboratory) are listed below for reference.    LAB RESULTS: Basic Metabolic Panel:  Recent Labs Lab 10/02/14 1825 10/03/14 0400  NA 136 132*  K 3.5 2.8*  CL 94* 92*  CO2 30 30  GLUCOSE 128* 178*  BUN 60* 58*  CREATININE 1.35 1.27  CALCIUM 8.2* 7.9*   Liver Function Tests:  Recent Labs Lab 10/02/14 1825 10/03/14 0400  AST 25 23  ALT 15 14  ALKPHOS 68 60  BILITOT 0.7 0.9  PROT 7.0 6.3  ALBUMIN 3.3* 2.9*   No results for input(s): LIPASE, AMYLASE in the last 168 hours. No results for input(s): AMMONIA in the last 168 hours. CBC:  Recent Labs Lab 10/02/14 1825 10/03/14 0400  WBC 6.9 5.4  NEUTROABS 5.7 3.2  HGB 12.3* 11.2*  HCT 38.2* 35.0*  MCV 96.7 96.7  PLT 110* 112*   Cardiac Enzymes:  Recent Labs Lab 10/03/14 0400 10/03/14 1050  TROPONINI 0.03 0.03   BNP: Invalid input(s): POCBNP CBG:  Recent Labs Lab 10/03/14 0614 10/03/14 1057  GLUCAP 125* 191*    Significant Diagnostic Studies:  Dg Chest 2 View  10/02/2014   CLINICAL DATA:  Shortness of breath, bilateral lower extremity pain and swelling  EXAM: CHEST  2 VIEW  COMPARISON:  08/28/2014 chest CT, 07/21/2014 chest radiograph  FINDINGS: Mild cardiomegaly noted. Lungs are hypoaerated with crowding of the bronchovascular markings. Patchy left lower lobe airspace opacity is identified. No pleural effusion. No acute osseous finding. Lower thoracic kyphosis reidentified.  IMPRESSION: Low volume exam with patchy bilateral lower lobe airspace opacities, likely atelectasis although early pneumonia could appear similar. If symptoms persist, consider PA and lateral chest radiographs obtained at full inspiration when the patient is clinically able.   Electronically Signed   By: Conchita Paris M.D.   On: 10/02/2014 20:07     Disposition and Follow-up:     Discharge Instructions    (HEART FAILURE PATIENTS) Call MD:  Anytime you have any of the following symptoms: 1) 3 pound weight gain in 24 hours or 5 pounds in 1 week 2) shortness of breath, with or without a dry hacking cough 3) swelling in the hands, feet or stomach 4) if you have to sleep on extra pillows at night in order to breathe.    Complete by:  As directed      Diet - low sodium heart healthy    Complete by:  As directed      Increase activity slowly    Complete by:  As directed             DISPOSITION: home   DISCHARGE FOLLOW-UP Follow-up Information    Follow up with [facility].   Why:  [place] within 24-48 hours of discharge   Contact information:   [address] [place] [id number] [phone]        Time spent on Discharge: 35 mins  Signed:   RAI,RIPUDEEP M.D. [facility] 10/03/2014, 2:23 PM Pager: [phone]

## 2014-10-03 NOTE — Evaluation (Signed)
Physical Therapy Evaluation Patient Details Name: [name] MRN: [id number] DOB: Nov 14, 1945 Today's Date: 10/03/2014   History of Present Illness  Pt is a 68 y/o male admitted s/p acute on chronic systolic and diastolic heart failure, NYHA class 4. PMH - recent CVA, breast CA, preipheral neuropathy, COPD, CABG, DM.  Clinical Impression  Pt admitted with above diagnosis. Pt currently with functional limitations due to the deficits listed below (see PT Problem List).  Pt will benefit from skilled PT to increase their independence and safety with mobility to allow discharge to the venue listed below.  Pt should be able to return home with support of daughter.     Follow Up Recommendations Home health PT;Supervision - Intermittent    Equipment Recommendations  None recommended by PT    Recommendations for Other Services       Precautions / Restrictions Precautions Precautions: Fall      Mobility  Bed Mobility Overal bed mobility: Needs Assistance Bed Mobility: Supine to Sit     Supine to sit: Min assist;HOB elevated     General bed mobility comments: Assist to bring trunk up.  Transfers Overall transfer level: Needs assistance Equipment used: Rolling walker (2 wheeled) Transfers: Sit to/from Stand Sit to Stand: Min guard;Min assist         General transfer comment: Min guard for safety from bed and min A from low commode.  Ambulation/Gait Ambulation/Gait assistance: Min guard Ambulation Distance (Feet): 200 Feet Assistive device: Rolling walker (2 wheeled) Gait Pattern/deviations: Step-through pattern;Decreased step length - right;Decreased step length - left;Decreased dorsiflexion - left;Decreased dorsiflexion - right;Wide base of support;Trunk flexed   Gait velocity interpretation: Below normal speed for age/gender General Gait Details: Pt without lt AFO since it is at home. Verbal cues to stand more erect.  Stairs            Wheelchair Mobility     Modified Rankin (Stroke Patients Only)       Balance Overall balance assessment: Needs assistance Sitting-balance support: No upper extremity supported;Feet supported Sitting balance-Leahy Scale: Good     Standing balance support: Bilateral upper extremity supported Standing balance-Leahy Scale: Poor Standing balance comment: support of walker                             Pertinent Vitals/Pain Pain Assessment: No/denies pain    Home Living Family/patient expects to be discharged to:: Private residence Living Arrangements: Children (daughter and son-in-law) Available Help at Discharge: Family Type of Home: House Home Access: Stairs to enter Entrance Stairs-Rails: Psychiatric nurse of Steps: 3 Home Layout: One level Home Equipment: Walker - 2 wheels;Grab bars - toilet;Grab bars - tub/shower;Wheelchair - manual      Prior Function Level of Independence: Independent with assistive device(s)         Comments: Amb with rolling walker. Has not been wearing AFO on regular basis.     Hand Dominance        Extremity/Trunk Assessment   Upper Extremity Assessment: Overall WFL for tasks assessed           Lower Extremity Assessment: RLE deficits/detail;LLE deficits/detail;Generalized weakness RLE Deficits / Details: dorsiflex 3-/5 LLE Deficits / Details: dorsiflex 2+/5     Communication      Cognition Arousal/Alertness: Awake/alert Behavior During Therapy: WFL for tasks assessed/performed Overall Cognitive Status: Within Functional Limits for tasks assessed  General Comments      Exercises        Assessment/Plan    PT Assessment Patient needs continued PT services  PT Diagnosis Difficulty walking;Generalized weakness   PT Problem List Decreased strength;Decreased balance;Decreased mobility  PT Treatment Interventions DME instruction;Balance training;Gait training;Functional mobility  training;Therapeutic activities;Therapeutic exercise;Patient/family education   PT Goals (Current goals can be found in the Care Plan section) Acute Rehab PT Goals Patient Stated Goal: Return home PT Goal Formulation: With patient Time For Goal Achievement: 10-24-2014 Potential to Achieve Goals: Good    Frequency Min 3X/week   Barriers to discharge        Co-evaluation               End of Session Equipment Utilized During Treatment: Gait belt Activity Tolerance: Patient tolerated treatment well Patient left: in chair;with call bell/phone within reach Nurse Communication: Mobility status         Time: 1018-1040 PT Time Calculation (min) (ACUTE ONLY): 22 min   Charges:   PT Evaluation $Initial PT Evaluation Tier I: 1 Procedure PT Treatments $Gait Training: 8-22 mins   PT G Codes:        Amyr Sluder October 24, 2014, 12:18 PM  [employer] PT [phone]

## 2014-10-03 NOTE — Care Management Note (Signed)
    Page 1 of 1   10/03/2014     1:25:42 PM CARE MANAGEMENT NOTE 10/03/2014  Patient:  [name], [name]   Account Number:  [id number]  Date Initiated:  10/03/2014  Documentation initiated by:  Mariann Laster  Subjective/Objective Assessment:   Weakness, cellulitis     Action/Plan:   CM to follow for disposition needs   Anticipated DC Date:  10/04/2014   Anticipated DC Plan:  [place]  CM consult      [facility] Choice  HOME HEALTH  Resumption Of Svcs/PTA Provider   Choice offered to / List presented to:  NA           [place]   Status of service:  Completed, signed off Medicare Important Message given?  YES (If response is "NO", the following Medicare IM given date fields will be blank) Date Medicare IM given:  10/03/2014 Medicare IM given by:  Jalyiah Shelley Date Additional Medicare IM given:   Additional Medicare IM given by:    Discharge Disposition:  [place]  Per UR Regulation:  Reviewed for med. necessity/level of care/duration of stay  If discussed at [place] of Stay Meetings, dates discussed:    Comments:  Kiowa Hollar RN, BSN, MSHL, CCM  Nurse - Case Manager,  ([facility] [place])  [phone]   10/03/2014 Social:  From home Patient states he is active with [facility]. PT RECS:  Pending Dispositon Plan:  Resume HHS

## 2014-10-03 NOTE — Progress Notes (Signed)
Pt. Arrived to unit alert and oriented and in stable condition. VSS. No s/s of distress noted. Pt. Oriented to room. Call light placed within reach. Pt. Placed on telemetry. CCMD notified. MD at pts. Bedside to assess pt. RN will continue to monitor pt. For changes in condition. Joshuwa Vecchio, Katherine Roan

## 2014-10-04 LAB — GLUCOSE, CAPILLARY: Glucose-Capillary: 132 mg/dL — ABNORMAL HIGH (ref 70–99)

## 2014-10-04 NOTE — Progress Notes (Signed)
Pt. Alert and stable. No s/s of distress noted. Pt. And daughter given discharge and follow-up instructions. Pt. Verbalized understanding. Pt. Discharged to home.

## 2014-10-21 ENCOUNTER — Other Ambulatory Visit: Payer: Self-pay

## 2014-10-21 DIAGNOSIS — C50929 Malignant neoplasm of unspecified site of unspecified male breast: Secondary | ICD-10-CM

## 2014-10-22 ENCOUNTER — Telehealth: Payer: Self-pay | Admitting: Hematology and Oncology

## 2014-10-22 ENCOUNTER — Other Ambulatory Visit (HOSPITAL_BASED_OUTPATIENT_CLINIC_OR_DEPARTMENT_OTHER): Payer: Medicare Other

## 2014-10-22 ENCOUNTER — Ambulatory Visit (HOSPITAL_BASED_OUTPATIENT_CLINIC_OR_DEPARTMENT_OTHER): Payer: Medicare Other | Admitting: Hematology and Oncology

## 2014-10-22 VITALS — BP 142/69 | HR 65 | Temp 97.6°F | Resp 18 | Ht 69.5 in | Wt 236.3 lb

## 2014-10-22 DIAGNOSIS — N183 Chronic kidney disease, stage 3 (moderate): Secondary | ICD-10-CM

## 2014-10-22 DIAGNOSIS — C50921 Malignant neoplasm of unspecified site of right male breast: Secondary | ICD-10-CM

## 2014-10-22 DIAGNOSIS — C50929 Malignant neoplasm of unspecified site of unspecified male breast: Secondary | ICD-10-CM

## 2014-10-22 LAB — CBC WITH DIFFERENTIAL/PLATELET
BASO%: 1 % (ref 0.0–2.0)
Basophils Absolute: 0.1 10*3/uL (ref 0.0–0.1)
EOS%: 8.6 % — ABNORMAL HIGH (ref 0.0–7.0)
Eosinophils Absolute: 0.6 10*3/uL — ABNORMAL HIGH (ref 0.0–0.5)
HCT: 37.9 % — ABNORMAL LOW (ref 38.4–49.9)
HEMOGLOBIN: 11.7 g/dL — AB (ref 13.0–17.1)
LYMPH#: 1.9 10*3/uL (ref 0.9–3.3)
LYMPH%: 28.1 % (ref 14.0–49.0)
MCH: 30 pg (ref 27.2–33.4)
MCHC: 31 g/dL — AB (ref 32.0–36.0)
MCV: 96.8 fL (ref 79.3–98.0)
MONO#: 0.6 10*3/uL (ref 0.1–0.9)
MONO%: 9.1 % (ref 0.0–14.0)
NEUT#: 3.5 10*3/uL (ref 1.5–6.5)
NEUT%: 53.2 % (ref 39.0–75.0)
NRBC: 0 % (ref 0–0)
Platelets: 105 10*3/uL — ABNORMAL LOW (ref 140–400)
RBC: 3.91 10*6/uL — AB (ref 4.20–5.82)
RDW: 18.3 % — ABNORMAL HIGH (ref 11.0–14.6)
WBC: 6.6 10*3/uL (ref 4.0–10.3)

## 2014-10-22 LAB — COMPREHENSIVE METABOLIC PANEL (CC13)
ALBUMIN: 3.2 g/dL — AB (ref 3.5–5.0)
ALK PHOS: 68 U/L (ref 40–150)
ALT: 6 U/L (ref 0–55)
ANION GAP: 8 meq/L (ref 3–11)
AST: 16 U/L (ref 5–34)
BUN: 55.3 mg/dL — ABNORMAL HIGH (ref 7.0–26.0)
CALCIUM: 8.6 mg/dL (ref 8.4–10.4)
CHLORIDE: 100 meq/L (ref 98–109)
CO2: 30 mEq/L — ABNORMAL HIGH (ref 22–29)
Creatinine: 1.4 mg/dL — ABNORMAL HIGH (ref 0.7–1.3)
EGFR: 52 mL/min/{1.73_m2} — ABNORMAL LOW (ref >90)
Glucose: 130 mg/dl (ref 70–140)
Potassium: 3.9 mEq/L (ref 3.5–5.1)
Sodium: 138 mEq/L (ref 136–145)
Total Bilirubin: 0.46 mg/dL (ref 0.20–1.20)
Total Protein: 6.6 g/dL (ref 6.4–8.3)

## 2014-10-22 NOTE — Telephone Encounter (Signed)
per pof to sch pt appt-gave pt copy pf sch-sch mamma @ [facility]

## 2014-10-22 NOTE — Progress Notes (Signed)
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   05/16/2014 Initial Diagnosis Right breast biopsy 12:00 invasive ductal carcinoma grade 2, ER 100%, PR 77,000, Ki-67 51%, HER-2 negative ratio 1.45   08/30/2014 - 09/01/2014 Hospital Admission 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 He had left-sided cerebellar infarct that required hospitalization November 2015. He was also hospitalized in December for some lower extremity weakness issues. He is now using a wheelchair for ambulation. He has met with Dr. Excell Seltzer regarding breast cancer surgery. He is also planning to meet Dr. Rush Farmer regarding ventral incisional hernia surgery. The plan is for him to undergo both surgeries at the same time. He was started on tamoxifen neoadjuvant fashion. He appears to be tolerating it extremely well without any side effects.  REVIEW OF SYSTEMS:   Constitutional: Denies fevers, chills or abnormal weight loss Eyes: Denies blurriness of vision Ears, nose, mouth, throat, and face: Denies mucositis or sore throat Respiratory: Denies cough, dyspnea or wheezes Cardiovascular: Denies palpitation, chest discomfort or  lower extremity swelling Gastrointestinal:  Denies nausea, heartburn or change in bowel habits Skin: Denies abnormal skin rashes Lymphatics: Denies new lymphadenopathy or easy bruising Neurological:Denies numbness, tingling or new weaknesses Behavioral/Psych: Mood is stable, no new changes  Breast: Right breast lump is palpable is unclear if it is smaller or larger about the same. All other systems were reviewed with the patient and are negative.  I have reviewed the past medical history, past surgical history, social history and family history with the patient and they are unchanged from previous note.  ALLERGIES:  is allergic to erythromycin.  MEDICATIONS:  Current Outpatient Prescriptions  Medication Sig Dispense Refill  . allopurinol (ZYLOPRIM) 300 MG tablet Take 1 tablet (300 mg total) by mouth daily. 30 tablet 3  . aspirin EC 325 MG EC tablet Take 1 tablet (325 mg total) by mouth daily. 30 tablet 0  . atorvastatin (LIPITOR) 20 MG tablet Take 1 tablet (20 mg total) by mouth daily. 30 tablet 1  . carvedilol (COREG) 3.125 MG tablet Take 1 tablet (3.125 mg total) by mouth 2 (two) times daily with a meal. 60 tablet 5  . colchicine 0.6 MG tablet Take 1 tablet (0.6 mg total) by mouth 2 (two) times daily. 30 tablet 0  . doxycycline (VIBRAMYCIN) 100 MG capsule Take 1 capsule (100 mg total) by mouth 2 (two) times daily. X 2 weeks 28 capsule 0  . FLUoxetine (PROZAC) 20 MG capsule Take 1 capsule (20 mg total) by mouth daily. 30 capsule 3  . Fluticasone Furoate 200 MCG/ACT AEPB Inhale 1 puff into the lungs 2 (two) times daily. 30 each 1  .  folic acid (FOLVITE) 1 MG tablet Take 1 tablet (1 mg total) by mouth daily. 30 tablet 1  . furosemide (LASIX) 80 MG tablet Take 2 tablets in the AM and 1 in the PM (Patient taking differently: Take 160 mg by mouth See admin instructions. Take to tablets in the morning and 1 tablet in the afternoon per daughter) 90 tablet 5  . gabapentin (NEURONTIN) 300 MG  capsule Take 1 capsule (300 mg total) by mouth 2 (two) times daily. 60 capsule 1  . insulin glargine (LANTUS) 100 UNIT/ML injection Inject 0.08 mLs (8 Units total) into the skin at bedtime. (Patient taking differently: Inject 12 Units into the skin at bedtime. ) 10 mL 11  . lactulose (CHRONULAC) 10 GM/15ML solution Take 30 mLs (20 g total) by mouth 2 (two) times daily. 240 mL 0  . levothyroxine (SYNTHROID, LEVOTHROID) 125 MCG tablet Take 1.5 tablets (187 mcg total) by mouth daily before breakfast. 30 tablet 3  . metolazone (ZAROXOLYN) 5 MG tablet Take 1 tablet (5 mg total) by mouth once a week. Take on Friday  and as needed. Take with an extra 20 meq of potassium (Patient taking differently: Take 5 mg by mouth once a week. On Friday) 12 tablet 6  . Multiple Vitamin (MULTIVITAMIN WITH MINERALS) TABS tablet Take 1 tablet by mouth daily.    Marland Kitchen omeprazole (PRILOSEC) 20 MG capsule Take 1 capsule (20 mg total) by mouth daily. 30 capsule 1  . oxyCODONE 10 MG TABS Take 1 tablet (10 mg total) by mouth every 6 (six) hours as needed for severe pain. 60 tablet 0  . potassium chloride SA (K-DUR,KLOR-CON) 20 MEQ tablet Take 3 tablets (60 mEq total) by mouth daily. 90 tablet 6  . tamoxifen (NOLVADEX) 20 MG tablet Take 1 tablet (20 mg total) by mouth daily. 90 tablet 3   No current facility-administered medications for this visit.    PHYSICAL EXAMINATION: ECOG PERFORMANCE STATUS: 3 - Symptomatic, >50% confined to bed  Filed Vitals:   10/22/14 1425  BP: 142/69  Pulse: 65  Temp: 97.6 F (36.4 C)  Resp: 18   Filed Weights   10/22/14 1425  Weight: 236 lb 4.8 oz (107.185 kg)    GENERAL:alert, no distress and comfortable SKIN: skin color, texture, turgor are normal, no rashes or significant lesions EYES: normal, Conjunctiva are pink and non-injected, sclera clear OROPHARYNX:no exudate, no erythema and lips, buccal mucosa, and tongue normal  NECK: supple, thyroid normal size, non-tender, without  nodularity LYMPH:  no palpable lymphadenopathy in the cervical, axillary or inguinal LUNGS: clear to auscultation and percussion with normal breathing effort HEART: regular rate & rhythm and no murmurs and no lower extremity edema ABDOMEN:abdomen soft, non-tender and normal bowel sounds Musculoskeletal:no cyanosis of digits and no clubbing  NEURO: alert & oriented x 3 with fluent speech, no focal motor/sensory deficits BREAST: Palpable mass in the right breast lower outer quadrant  LABORATORY DATA:  I have reviewed the data as listed   Chemistry      Component Value Date/Time   NA 138 10/22/2014 1407   NA 135 10/03/2014 1805   K 3.9 10/22/2014 1407   K 3.7 10/03/2014 1805   CL 93* 10/03/2014 1805   CO2 30* 10/22/2014 1407   CO2 30 10/03/2014 1805   BUN 55.3* 10/22/2014 1407   BUN 55* 10/03/2014 1805   CREATININE 1.4* 10/22/2014 1407   CREATININE 1.15 10/03/2014 1805      Component Value Date/Time   CALCIUM 8.6  10/22/2014 1407   CALCIUM 8.3* 10/03/2014 1805   ALKPHOS 68 10/22/2014 1407   ALKPHOS 60 10/03/2014 0400   AST 16 10/22/2014 1407   AST 23 10/03/2014 0400   ALT 6 10/22/2014 1407   ALT 14 10/03/2014 0400   BILITOT 0.46 10/22/2014 1407   BILITOT 0.9 10/03/2014 0400       Lab Results  Component Value Date   WBC 6.6 10/22/2014   HGB 11.7* 10/22/2014   HCT 37.9* 10/22/2014   MCV 96.8 10/22/2014   PLT 105* 10/22/2014   NEUTROABS 3.5 10/22/2014   ASSESSMENT & PLAN:  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 T1 C. N0 M0 stage IA. This was diagnosed August 2015. 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  Tamoxifen toxicities: Patient has not had any side effects of tamoxifen Multiple comorbidities including CKD stage III, CHF, COPD, chronic venous stasis, recent CVA.  Plan: I would like to obtain a mammogram next week to assess response to treatment. The plan is for him to undergo mastectomy along with ventral hernia leakage surgery at the same time. I do not believe there is any need for lymph node evaluation because he is definitely not a candidate for chemotherapy. We can present him in 2 months for an come up with a multidisciplinary care plan. If he does undergo surgery, instructed him to stop tamoxifen 2 weeks prior to surgery.  Return to clinic after surgery for follow-up   Orders Placed This Encounter  Procedures  . MM Digital Diagnostic Unilat R    EPIC ORDER PF: 05/16/2014 [facility]  NO NEEDS  CR/CLEMESIA  MEDICARE    Standing Status: Future     Number of Occurrences:      Standing Expiration Date: 10/22/2015    Scheduling Instructions:     Patient has an appointment at [facility] at 1.00 PM. Please schedule it prior to that.    Order Specific Question:  Reason for Exam (SYMPTOM  OR DIAGNOSIS REQUIRED)    Answer:  Right breast cancer on tamoxifen. Evaluate response to therapy    Order Specific Question:  Preferred imaging location?    Answer:  [facility]   The patient has a good understanding of the overall plan. he agrees with it. She will call with any problems that may develop before her next visit here.   Rulon Eisenmenger, MD 10/22/2014 4:06 PM

## 2014-10-22 NOTE — Assessment & Plan Note (Addendum)
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 T1 C. N0 M0 stage IA. This was diagnosed August 2015. Attempts for surgery were hampered by hospitalization for left cerebellar infarct with hemorrhage as well as enterocutaneous fistula around the umbilicus from prior umbilicus hernia surgery  Current treatment: Neoadjuvant tamoxifen 20 mg daily until it becomes a surgical candidate then he can undergo mastectomy.  Tamoxifen toxicities: Patient has not had any side effects of tamoxifen Multiple comorbidities including CKD stage III, CHF, COPD, chronic venous stasis, recent CVA.  Plan: I would like to obtain a mammogram next week to assess response to treatment. The plan is for him to undergo mastectomy along with ventral hernia leakage surgery at the same time. I do not believe there is any need for lymph node evaluation because he is definitely not a candidate for chemotherapy. We can present him in 2 months for an come up with a multidisciplinary care plan.  Return to clinic after surgery for follow-uplow-up

## 2014-10-25 ENCOUNTER — Other Ambulatory Visit: Payer: Self-pay

## 2014-10-25 DIAGNOSIS — C50929 Malignant neoplasm of unspecified site of unspecified male breast: Secondary | ICD-10-CM

## 2014-10-25 DIAGNOSIS — C50921 Malignant neoplasm of unspecified site of right male breast: Secondary | ICD-10-CM

## 2014-10-29 ENCOUNTER — Other Ambulatory Visit (INDEPENDENT_AMBULATORY_CARE_PROVIDER_SITE_OTHER): Payer: Self-pay | Admitting: Surgery

## 2014-10-31 ENCOUNTER — Other Ambulatory Visit (INDEPENDENT_AMBULATORY_CARE_PROVIDER_SITE_OTHER): Payer: Self-pay | Admitting: General Surgery

## 2014-11-05 ENCOUNTER — Telehealth: Payer: Self-pay | Admitting: *Deleted

## 2014-11-05 DIAGNOSIS — T451X5A Adverse effect of antineoplastic and immunosuppressive drugs, initial encounter: Secondary | ICD-10-CM

## 2014-11-05 DIAGNOSIS — C50929 Malignant neoplasm of unspecified site of unspecified male breast: Secondary | ICD-10-CM

## 2014-11-05 DIAGNOSIS — R232 Flushing: Secondary | ICD-10-CM

## 2014-11-05 MED ORDER — VENLAFAXINE HCL ER 37.5 MG PO CP24: 37.5000 mg | ORAL_CAPSULE | Freq: Every day | ORAL | Status: DC

## 2014-11-05 NOTE — Telephone Encounter (Addendum)
Received VM from patient's daughter, [name], inquiring about why patient's mammogram was cancelled for tomorrow. She also states patient stopped taking Tamoxifen approximently a week and a half ago because of hot flashes.  Information discussed with Dr. Lindi Adie. He states patient can try Effexor XR 37.5mg  daily for hot flashes and resume taking Tamoxifen.  Also, confirmed he does want patient to have the mammogram. [place] and got mammogram scheduled again for tomorrow at 3:30pm with a 3:15pm arrival time.  Called back and spoke with [name] again - she wrote down appt time for tomorrow. She is not sure if it will work with her and her dad's schedule as they live in [place]. [facility] phone number [phone] for [facility] to call and reschedule if the appt doesn't work for them. [name] agreeable to pick up Effexor XR for her dad and to inform him to restart Tamoxifen.

## 2014-11-06 ENCOUNTER — Other Ambulatory Visit (INDEPENDENT_AMBULATORY_CARE_PROVIDER_SITE_OTHER): Payer: Self-pay | Admitting: General Surgery

## 2014-11-08 ENCOUNTER — Encounter (HOSPITAL_COMMUNITY): Payer: Self-pay | Admitting: *Deleted

## 2014-11-08 NOTE — Progress Notes (Signed)
Anesthesia Chart Review: SAME DAY WORK-UP.  Patient is a 69 year old male scheduled for right total mastectomy with axillary SN biopsy on 11/11/14.  Surgery was initially scheduled for 08/23/14, but was postponed so he would be > 3 months past embolic CVA. (See my note from 08/22/14.)  Cardiology clearance was obtained back on 06/02/14 and again on 08/08/14 after hospital follow-up. He is felt to be "at least moderate risk." Close attention will need to be paid to his volume status given significant hx of diastolic CHF. Last cardiology office visit was 08/29/14 with last evaluation on 10/03/14 during admission for CHF exacerbation responsive to diuretics.   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 Clydene Fake last note from 08/02/14 indicates the CVA was "embolic secondary to known atrial fibrillation."),  former smoker, COPD, DM2 with peripheral neuropathy, CAD s/p CABG (LIMA to LAD, left RA to OM, SVG to DIAG, SVG to RCA) '01, afib (not felt to currently be a candidate for anticoagulation due to fall risk, ETOH), chronic diastolic CHF, ETOH abuse, mariguana use (none in ~ one year), PVD, left CEA '02, hypothyroidism, depression, OSA without CPAP use, CKD stage III, GERD, frequent falls, small bowel resection for strangulated umbilical hernia 2/94/76 with admission 08/30/14 for enterocutaneous fistula without abscess. BMI is consistent with obesity. PCP is listed as Dr. Jene Every. Cardiologist is Dr. Angelena Form. HEM-ONC is Dr. Lindi Adie. Neurologist is Dr. Leonie Man.  Nuclear stress test on 05/30/14: Overall Impression: Normal stress nuclear study. LV Ejection Fraction: 63%. LV Wall Motion: NL LV Function; NL Wall Motion.  Echo on 04/21/14:  - Left ventricle: The cavity size was normal. Wall thickness was normal. Systolic function was normal. The estimated ejectionfraction was in the range of 50% to 55%. Doppler parameters areconsistent with both elevated ventricular  end-diastolic fillingpressure and elevated left atrial filling pressure. - Mitral valve: Calcified annulus. Mildly thickened leaflets . - Left atrium: Moderately to severely dilated. - Right ventricle: The cavity size was moderately dilated. - Right atrium: The atrium was moderately dilated. - Atrial septum: No defect or patent foramen ovale was identified.  EKG on 10/02/14: Afib, borderline  Low voltage limb leads, borderline repolarization abnormality.  Rather diffuse T wave abnormality, most pronounced in anterior leads. (Has since had cardiology evaluation.)  Carotid duplex 08/01/14: LEFT ICA 1-39% stenosis. Right VA flow antegrade. Unable to visual the right ICA and left VA due to poor patient cooperation. Unable to duplicate bilateral 54-65% ICA stenosis documented on prior 02/19/14 exam.  MRI of the brain on 07/31/14: Moderate-sized acute infarction affecting the LEFT inferior cerebellum, with hemorrhagic transformation medially. Recommend continued surveillance with CT scanning to exclude expanding posterior fossa hematoma. LEFT PICA thrombosis. Suspect adjacent LEFT vertebral dissection or focal plaque. Generalized atrophy with small vessel disease.  Head CT 08/01/14: Evolving LEFT posterior inferior cerebellar artery territory infarct with petechial hemorrhage. Involutional changes. Mild white matter changes suggest chronic small vessel ischemic disease. LEFT mesial frontal lobe encephalomalacia is likely post ischemic.  CXR on 10/02/14: IMPRESSION: Low volume exam with patchy bilateral lower lobe airspace opacities, likely atelectasis although early pneumonia could appear similar. If symptoms persist, consider PA and lateral chest radiographs obtained at full inspiration when the patient is clinically able. Consider repeating CXR   He is scheduled for labs on arrival.    Patient with multiple co-morbidities. A few months ago I spoke with his neurologist regarding surgery plans.   Patient is now > 3 months past his CVA.  He was felt moderate risk for surgery from a CV standpoint ~ 3 months ago, but did require an overnight stay for acute CHF last month.  Cardiology did re-evaluate him then, and he was discharged back at his dry weight.  He is a same day work-up so one of our PAT RNs will contact him today to inquire about any acute changes and let me know if any--otherwise he will be further evaluated by his anesthesiologist on the day of surgery. Will need to ensure that there are no S/S of acute CHF exacerbation and that he is maintaining rate controlled afib prior to proceeding.  George Hugh [facility]Anesthesiology Phone [phone] 11/08/2014 11:34 AM

## 2014-11-08 NOTE — Progress Notes (Signed)
SDW- pre-op assessment completed by pt daughter [name] with consent from father. [name] made aware to stop administering Aspirin, vitamins, and herbal medications. Do not take any NSAIDs ie: Ibuprofen, Advil, Naproxen or any medication containing Aspirin.

## 2014-11-10 MED ORDER — CEFAZOLIN SODIUM-DEXTROSE 2-3 GM-% IV SOLR
2.0000 g | INTRAVENOUS | Status: AC
  Administered 2014-11-11: 2 g via INTRAVENOUS
  Filled 2014-11-10: qty 50

## 2014-11-10 NOTE — Anesthesia Preprocedure Evaluation (Addendum)
Anesthesia Evaluation  Patient identified by MRN, date of birth, ID band Patient awake    Reviewed: Allergy & Precautions, NPO status , Patient's Chart, lab work & pertinent test results  Airway Mallampati: II   Neck ROM: Full    Dental  (+) Dental Advisory Given   Pulmonary asthma , sleep apnea (no CPAP) , COPDformer smoker,  breath sounds clear to auscultation        Cardiovascular hypertension, Pt. on medications + CAD, + Peripheral Vascular Disease and +CHF (admitted one month ago in CHF, responded well to diuresis) + dysrhythmias Atrial Fibrillation Rhythm:Irregular  Stress and ECHO 07/2014 EF 91%, grade 3 diastolic failure.  SP ACB 2001, not a candidate for anticoagulation 2nd multiple falls, ETOH   Neuro/Psych Depression CVA (embolic CVA 47/8295 2nd atrial fib, being followed by neurology, reported 80% stenosis R IC artery)    GI/Hepatic GERD-  Medicated,  Endo/Other  diabetes, Type 2, Insulin DependentHypothyroidism   Renal/GU Renal InsufficiencyRenal diseaseGRADE 1 RI     Musculoskeletal  (+) Arthritis -,   Abdominal (+) + obese,   Peds  Hematology   Anesthesia Other Findings   Reproductive/Obstetrics                           Anesthesia Physical Anesthesia Plan  ASA: IV  Anesthesia Plan: General   Post-op Pain Management: MAC Combined w/ Regional for Post-op pain   Induction: Intravenous  Airway Management Planned: Oral ETT  Additional Equipment:   Intra-op Plan:   Post-operative Plan: Extubation in OR  Informed Consent: I have reviewed the patients History and Physical, chart, labs and discussed the procedure including the risks, benefits and alternatives for the proposed anesthesia with the patient or authorized representative who has indicated his/her understanding and acceptance.     Plan Discussed with:   Anesthesia Plan Comments: (Volume sensitive, goes into failure  easily, documented diastolic heart dysfunction, check am labs and evaluate CHF status)       Anesthesia Quick Evaluation

## 2014-11-11 ENCOUNTER — Encounter (HOSPITAL_COMMUNITY)
Admission: RE | Disposition: A | Discharge status: Home / Self Care | Payer: Self-pay | Source: Ambulatory Visit | Attending: General Surgery

## 2014-11-11 ENCOUNTER — Ambulatory Visit (HOSPITAL_COMMUNITY): Payer: Medicare Other | Admitting: Vascular Surgery

## 2014-11-11 ENCOUNTER — Observation Stay (HOSPITAL_COMMUNITY)
Admission: RE | Admit: 2014-11-11 | Discharge: 2014-11-12 | Disposition: A | Discharge status: Home / Self Care | Payer: Medicare Other | Source: Ambulatory Visit | Attending: General Surgery | Admitting: General Surgery

## 2014-11-11 ENCOUNTER — Encounter (HOSPITAL_COMMUNITY): Payer: Self-pay | Admitting: Certified Registered"

## 2014-11-11 DIAGNOSIS — I5032 Chronic diastolic (congestive) heart failure: Secondary | ICD-10-CM | POA: Diagnosis not present

## 2014-11-11 DIAGNOSIS — I482 Chronic atrial fibrillation: Secondary | ICD-10-CM | POA: Insufficient documentation

## 2014-11-11 DIAGNOSIS — E785 Hyperlipidemia, unspecified: Secondary | ICD-10-CM | POA: Insufficient documentation

## 2014-11-11 DIAGNOSIS — F329 Major depressive disorder, single episode, unspecified: Secondary | ICD-10-CM | POA: Diagnosis not present

## 2014-11-11 DIAGNOSIS — Z794 Long term (current) use of insulin: Secondary | ICD-10-CM | POA: Diagnosis not present

## 2014-11-11 DIAGNOSIS — M1990 Unspecified osteoarthritis, unspecified site: Secondary | ICD-10-CM | POA: Insufficient documentation

## 2014-11-11 DIAGNOSIS — N183 Chronic kidney disease, stage 3 (moderate): Secondary | ICD-10-CM | POA: Diagnosis not present

## 2014-11-11 DIAGNOSIS — I2510 Atherosclerotic heart disease of native coronary artery without angina pectoris: Secondary | ICD-10-CM | POA: Insufficient documentation

## 2014-11-11 DIAGNOSIS — J449 Chronic obstructive pulmonary disease, unspecified: Secondary | ICD-10-CM | POA: Insufficient documentation

## 2014-11-11 DIAGNOSIS — Z8673 Personal history of transient ischemic attack (TIA), and cerebral infarction without residual deficits: Secondary | ICD-10-CM | POA: Diagnosis not present

## 2014-11-11 DIAGNOSIS — I129 Hypertensive chronic kidney disease with stage 1 through stage 4 chronic kidney disease, or unspecified chronic kidney disease: Secondary | ICD-10-CM | POA: Diagnosis not present

## 2014-11-11 DIAGNOSIS — Z951 Presence of aortocoronary bypass graft: Secondary | ICD-10-CM | POA: Diagnosis not present

## 2014-11-11 DIAGNOSIS — C50921 Malignant neoplasm of unspecified site of right male breast: Secondary | ICD-10-CM | POA: Diagnosis not present

## 2014-11-11 DIAGNOSIS — J45909 Unspecified asthma, uncomplicated: Secondary | ICD-10-CM | POA: Diagnosis not present

## 2014-11-11 DIAGNOSIS — E039 Hypothyroidism, unspecified: Secondary | ICD-10-CM | POA: Diagnosis not present

## 2014-11-11 DIAGNOSIS — C50929 Malignant neoplasm of unspecified site of unspecified male breast: Secondary | ICD-10-CM | POA: Diagnosis present

## 2014-11-11 DIAGNOSIS — E119 Type 2 diabetes mellitus without complications: Secondary | ICD-10-CM | POA: Diagnosis not present

## 2014-11-11 HISTORY — PX: TOTAL MASTECTOMY: SHX6129

## 2014-11-11 HISTORY — DX: Fistula of intestine: K63.2

## 2014-11-11 LAB — COMPREHENSIVE METABOLIC PANEL
ALBUMIN: 3.4 g/dL — AB (ref 3.5–5.2)
ALK PHOS: 61 U/L (ref 39–117)
ALT: 12 U/L (ref 0–53)
ANION GAP: 10 (ref 5–15)
AST: 18 U/L (ref 0–37)
BILIRUBIN TOTAL: 0.6 mg/dL (ref 0.3–1.2)
BUN: 54 mg/dL — ABNORMAL HIGH (ref 6–23)
CO2: 32 mmol/L (ref 19–32)
CREATININE: 1.22 mg/dL (ref 0.50–1.35)
Calcium: 9.4 mg/dL (ref 8.4–10.5)
Chloride: 95 mmol/L — ABNORMAL LOW (ref 96–112)
GFR calc Af Amer: 68 mL/min — ABNORMAL LOW (ref >90)
GFR, EST NON AFRICAN AMERICAN: 58 mL/min — AB (ref >90)
Glucose, Bld: 127 mg/dL — ABNORMAL HIGH (ref 70–99)
Potassium: 4.1 mmol/L (ref 3.5–5.1)
SODIUM: 137 mmol/L (ref 135–145)
Total Protein: 7.1 g/dL (ref 6.0–8.3)

## 2014-11-11 LAB — GLUCOSE, CAPILLARY
GLUCOSE-CAPILLARY: 121 mg/dL — AB (ref 70–99)
GLUCOSE-CAPILLARY: 119 mg/dL — AB (ref 70–99)
Glucose-Capillary: 120 mg/dL — ABNORMAL HIGH (ref 70–99)
Glucose-Capillary: 182 mg/dL — ABNORMAL HIGH (ref 70–99)
Glucose-Capillary: 182 mg/dL — ABNORMAL HIGH (ref 70–99)

## 2014-11-11 LAB — CBC
HEMATOCRIT: 39.7 % (ref 39.0–52.0)
Hemoglobin: 12.7 g/dL — ABNORMAL LOW (ref 13.0–17.0)
MCH: 30.8 pg (ref 26.0–34.0)
MCHC: 32 g/dL (ref 30.0–36.0)
MCV: 96.1 fL (ref 78.0–100.0)
PLATELETS: 117 10*3/uL — AB (ref 150–400)
RBC: 4.13 MIL/uL — ABNORMAL LOW (ref 4.22–5.81)
RDW: 16.5 % — AB (ref 11.5–15.5)
WBC: 7.2 10*3/uL (ref 4.0–10.5)

## 2014-11-11 SURGERY — MASTECTOMY, SIMPLE
Anesthesia: General | Site: Breast | Laterality: Right

## 2014-11-11 MED ORDER — SUCCINYLCHOLINE CHLORIDE 20 MG/ML IJ SOLN
INTRAMUSCULAR | Status: DC | PRN
  Administered 2014-11-11: 100 mg via INTRAVENOUS

## 2014-11-11 MED ORDER — GABAPENTIN 300 MG PO CAPS
300.0000 mg | ORAL_CAPSULE | Freq: Two times a day (BID) | ORAL | Status: DC
  Administered 2014-11-11 – 2014-11-12 (×2): 300 mg via ORAL
  Filled 2014-11-11 (×3): qty 1

## 2014-11-11 MED ORDER — PHENYLEPHRINE HCL 10 MG/ML IJ SOLN
INTRAMUSCULAR | Status: DC | PRN
  Administered 2014-11-11: 80 ug via INTRAVENOUS

## 2014-11-11 MED ORDER — FOLIC ACID 1 MG PO TABS
1.0000 mg | ORAL_TABLET | Freq: Every day | ORAL | Status: DC
  Administered 2014-11-11 – 2014-11-12 (×2): 1 mg via ORAL
  Filled 2014-11-11 (×2): qty 1

## 2014-11-11 MED ORDER — ONDANSETRON HCL 4 MG/2ML IJ SOLN
INTRAMUSCULAR | Status: DC | PRN
  Administered 2014-11-11: 4 mg via INTRAVENOUS

## 2014-11-11 MED ORDER — INSULIN ASPART 100 UNIT/ML ~~LOC~~ SOLN
0.0000 [IU] | Freq: Three times a day (TID) | SUBCUTANEOUS | Status: DC
  Administered 2014-11-11: 3 [IU] via SUBCUTANEOUS
  Administered 2014-11-12: 2 [IU] via SUBCUTANEOUS

## 2014-11-11 MED ORDER — FENTANYL CITRATE 0.05 MG/ML IJ SOLN
25.0000 ug | INTRAMUSCULAR | Status: DC | PRN
  Administered 2014-11-11: 25 ug via INTRAVENOUS

## 2014-11-11 MED ORDER — ONDANSETRON HCL 4 MG PO TABS: 4.0000 mg | ORAL_TABLET | Freq: Four times a day (QID) | ORAL | Status: DC | PRN

## 2014-11-11 MED ORDER — TAMOXIFEN CITRATE 20 MG PO TABS: 20.0000 mg | ORAL_TABLET | Freq: Every day | ORAL | Status: DC

## 2014-11-11 MED ORDER — INSULIN GLARGINE 100 UNIT/ML ~~LOC~~ SOLN
8.0000 [IU] | Freq: Every day | SUBCUTANEOUS | Status: DC
  Administered 2014-11-11: 8 [IU] via SUBCUTANEOUS
  Filled 2014-11-11 (×2): qty 0.08

## 2014-11-11 MED ORDER — MEPERIDINE HCL 25 MG/ML IJ SOLN: 6.2500 mg | INTRAMUSCULAR | Status: DC | PRN

## 2014-11-11 MED ORDER — OXYCODONE HCL 5 MG PO TABS
10.0000 mg | ORAL_TABLET | ORAL | Status: DC | PRN
  Administered 2014-11-11 – 2014-11-12 (×4): 10 mg via ORAL
  Filled 2014-11-11 (×4): qty 2

## 2014-11-11 MED ORDER — FUROSEMIDE 80 MG PO TABS
80.0000 mg | ORAL_TABLET | Freq: Two times a day (BID) | ORAL | Status: DC
  Administered 2014-11-12: 80 mg via ORAL
  Filled 2014-11-11 (×4): qty 1

## 2014-11-11 MED ORDER — BUPIVACAINE-EPINEPHRINE (PF) 0.5% -1:200000 IJ SOLN
INTRAMUSCULAR | Status: DC | PRN
  Administered 2014-11-11: 30 mL

## 2014-11-11 MED ORDER — FENTANYL CITRATE 0.05 MG/ML IJ SOLN
INTRAMUSCULAR | Status: AC
  Administered 2014-11-11: 50 ug
  Filled 2014-11-11: qty 2

## 2014-11-11 MED ORDER — LIDOCAINE HCL (CARDIAC) 20 MG/ML IV SOLN
INTRAVENOUS | Status: DC | PRN
  Administered 2014-11-11: 100 mg via INTRAVENOUS

## 2014-11-11 MED ORDER — FENTANYL CITRATE 0.05 MG/ML IJ SOLN
INTRAMUSCULAR | Status: AC
  Filled 2014-11-11: qty 5

## 2014-11-11 MED ORDER — FLUOXETINE HCL 20 MG PO CAPS
20.0000 mg | ORAL_CAPSULE | Freq: Every day | ORAL | Status: DC
  Administered 2014-11-12: 20 mg via ORAL
  Filled 2014-11-11: qty 1

## 2014-11-11 MED ORDER — ATORVASTATIN CALCIUM 20 MG PO TABS
20.0000 mg | ORAL_TABLET | Freq: Every day | ORAL | Status: DC
  Administered 2014-11-11: 20 mg via ORAL
  Filled 2014-11-11 (×2): qty 1

## 2014-11-11 MED ORDER — POTASSIUM CHLORIDE CRYS ER 20 MEQ PO TBCR
60.0000 meq | EXTENDED_RELEASE_TABLET | Freq: Every day | ORAL | Status: DC
  Administered 2014-11-11 – 2014-11-12 (×2): 60 meq via ORAL
  Filled 2014-11-11 (×2): qty 3

## 2014-11-11 MED ORDER — LEVOTHYROXINE SODIUM 50 MCG PO TABS
187.0000 ug | ORAL_TABLET | Freq: Every day | ORAL | Status: DC
  Administered 2014-11-12: 187 ug via ORAL
  Filled 2014-11-11 (×2): qty 1

## 2014-11-11 MED ORDER — CHLORHEXIDINE GLUCONATE 4 % EX LIQD
1.0000 "application " | Freq: Once | CUTANEOUS | Status: DC
  Filled 2014-11-11: qty 15

## 2014-11-11 MED ORDER — ALLOPURINOL 300 MG PO TABS
300.0000 mg | ORAL_TABLET | Freq: Every day | ORAL | Status: DC
  Administered 2014-11-12: 300 mg via ORAL
  Filled 2014-11-11: qty 1

## 2014-11-11 MED ORDER — TAMOXIFEN CITRATE 10 MG PO TABS
20.0000 mg | ORAL_TABLET | Freq: Every day | ORAL | Status: DC
  Administered 2014-11-11 – 2014-11-12 (×2): 20 mg via ORAL
  Filled 2014-11-11 (×2): qty 2

## 2014-11-11 MED ORDER — FLUTICASONE PROPIONATE HFA 220 MCG/ACT IN AERO
1.0000 | INHALATION_SPRAY | Freq: Two times a day (BID) | RESPIRATORY_TRACT | Status: DC
  Administered 2014-11-11 – 2014-11-12 (×2): 1 via RESPIRATORY_TRACT
  Filled 2014-11-11: qty 12

## 2014-11-11 MED ORDER — PROPOFOL 10 MG/ML IV BOLUS
INTRAVENOUS | Status: AC
  Filled 2014-11-11: qty 20

## 2014-11-11 MED ORDER — FENTANYL CITRATE 0.05 MG/ML IJ SOLN
INTRAMUSCULAR | Status: DC | PRN
  Administered 2014-11-11 (×3): 50 ug via INTRAVENOUS

## 2014-11-11 MED ORDER — PHENYLEPHRINE HCL 10 MG/ML IJ SOLN
10.0000 mg | INTRAMUSCULAR | Status: DC | PRN
  Administered 2014-11-11: 20 ug/min via INTRAVENOUS

## 2014-11-11 MED ORDER — CARVEDILOL 3.125 MG PO TABS
3.1250 mg | ORAL_TABLET | Freq: Two times a day (BID) | ORAL | Status: DC
  Administered 2014-11-11 – 2014-11-12 (×2): 3.125 mg via ORAL
  Filled 2014-11-11 (×4): qty 1

## 2014-11-11 MED ORDER — FLUTICASONE FUROATE 200 MCG/ACT IN AEPB: 1.0000 | INHALATION_SPRAY | Freq: Two times a day (BID) | RESPIRATORY_TRACT | Status: DC

## 2014-11-11 MED ORDER — COLCHICINE 0.6 MG PO TABS
0.6000 mg | ORAL_TABLET | Freq: Two times a day (BID) | ORAL | Status: DC
  Administered 2014-11-11 – 2014-11-12 (×2): 0.6 mg via ORAL
  Filled 2014-11-11 (×3): qty 1

## 2014-11-11 MED ORDER — ADULT MULTIVITAMIN W/MINERALS CH
1.0000 | ORAL_TABLET | Freq: Every day | ORAL | Status: DC
  Administered 2014-11-12: 1 via ORAL
  Filled 2014-11-11: qty 1

## 2014-11-11 MED ORDER — HEPARIN SODIUM (PORCINE) 5000 UNIT/ML IJ SOLN
5000.0000 [IU] | Freq: Three times a day (TID) | INTRAMUSCULAR | Status: DC
  Administered 2014-11-11 – 2014-11-12 (×2): 5000 [IU] via SUBCUTANEOUS
  Filled 2014-11-11 (×3): qty 1

## 2014-11-11 MED ORDER — ONDANSETRON HCL 4 MG/2ML IJ SOLN: 4.0000 mg | Freq: Four times a day (QID) | INTRAMUSCULAR | Status: DC | PRN

## 2014-11-11 MED ORDER — MORPHINE SULFATE 2 MG/ML IJ SOLN
2.0000 mg | INTRAMUSCULAR | Status: DC | PRN
  Administered 2014-11-11 (×2): 2 mg via INTRAVENOUS
  Administered 2014-11-12 (×2): 4 mg via INTRAVENOUS
  Filled 2014-11-11 (×2): qty 2
  Filled 2014-11-11 (×2): qty 1

## 2014-11-11 MED ORDER — FENTANYL CITRATE 0.05 MG/ML IJ SOLN
INTRAMUSCULAR | Status: AC
  Filled 2014-11-11: qty 2

## 2014-11-11 MED ORDER — ROCURONIUM BROMIDE 50 MG/5ML IV SOLN
INTRAVENOUS | Status: AC
  Filled 2014-11-11: qty 1

## 2014-11-11 MED ORDER — POTASSIUM CHLORIDE IN NACL 20-0.45 MEQ/L-% IV SOLN
INTRAVENOUS | Status: DC
  Administered 2014-11-11: 15:00:00 via INTRAVENOUS
  Filled 2014-11-11 (×3): qty 1000

## 2014-11-11 MED ORDER — PROPOFOL 10 MG/ML IV BOLUS
INTRAVENOUS | Status: DC | PRN
  Administered 2014-11-11: 150 mg via INTRAVENOUS

## 2014-11-11 MED ORDER — PANTOPRAZOLE SODIUM 40 MG PO TBEC
40.0000 mg | DELAYED_RELEASE_TABLET | Freq: Every day | ORAL | Status: DC
  Administered 2014-11-12: 40 mg via ORAL
  Filled 2014-11-11: qty 1

## 2014-11-11 MED ORDER — VENLAFAXINE HCL ER 37.5 MG PO CP24
37.5000 mg | ORAL_CAPSULE | Freq: Every day | ORAL | Status: DC
  Administered 2014-11-12: 37.5 mg via ORAL
  Filled 2014-11-11: qty 1

## 2014-11-11 MED ORDER — MIDAZOLAM HCL 2 MG/2ML IJ SOLN
INTRAMUSCULAR | Status: AC
  Administered 2014-11-11: 1 mg
  Filled 2014-11-11: qty 2

## 2014-11-11 MED ORDER — LACTATED RINGERS IV SOLN
INTRAVENOUS | Status: DC
  Administered 2014-11-11: 10:00:00 via INTRAVENOUS

## 2014-11-11 MED ORDER — LIDOCAINE HCL (CARDIAC) 20 MG/ML IV SOLN
INTRAVENOUS | Status: AC
  Filled 2014-11-11: qty 5

## 2014-11-11 MED ORDER — LACTULOSE 10 GM/15ML PO SOLN: 20.0000 g | Freq: Two times a day (BID) | ORAL | Status: DC | PRN

## 2014-11-11 MED ORDER — PROMETHAZINE HCL 25 MG/ML IJ SOLN: 6.2500 mg | INTRAMUSCULAR | Status: DC | PRN

## 2014-11-11 MED ORDER — 0.9 % SODIUM CHLORIDE (POUR BTL) OPTIME
TOPICAL | Status: DC | PRN
  Administered 2014-11-11: 1000 mL

## 2014-11-11 SURGICAL SUPPLY — 49 items
BINDER BREAST LRG (GAUZE/BANDAGES/DRESSINGS) IMPLANT
BINDER BREAST XLRG (GAUZE/BANDAGES/DRESSINGS) IMPLANT
BIOPATCH RED 1 DISK 7.0 (GAUZE/BANDAGES/DRESSINGS) ×2 IMPLANT
BIOPATCH RED 1IN DISK 7.0MM (GAUZE/BANDAGES/DRESSINGS) ×1
CANISTER SUCTION 2500CC (MISCELLANEOUS) ×3 IMPLANT
CHLORAPREP W/TINT 26ML (MISCELLANEOUS) ×3 IMPLANT
CLIP TI MEDIUM 6 (CLIP) ×3 IMPLANT
COVER SURGICAL LIGHT HANDLE (MISCELLANEOUS) ×3 IMPLANT
DEVICE DISSECT PLASMABLAD 3.0S (MISCELLANEOUS) ×1 IMPLANT
DRAIN CHANNEL 19F RND (DRAIN) ×3 IMPLANT
DRAPE CHEST BREAST 15X10 FENES (DRAPES) ×3 IMPLANT
DRAPE UTILITY XL STRL (DRAPES) IMPLANT
DRSG PAD ABDOMINAL 8X10 ST (GAUZE/BANDAGES/DRESSINGS) ×6 IMPLANT
DRSG TEGADERM 2-3/8X2-3/4 SM (GAUZE/BANDAGES/DRESSINGS) ×3 IMPLANT
ELECT CAUTERY BLADE 6.4 (BLADE) ×3 IMPLANT
ELECT REM PT RETURN 9FT ADLT (ELECTROSURGICAL) ×6
ELECTRODE REM PT RTRN 9FT ADLT (ELECTROSURGICAL) ×2 IMPLANT
EVACUATOR SILICONE 100CC (DRAIN) ×3 IMPLANT
GAUZE XEROFORM 5X9 LF (GAUZE/BANDAGES/DRESSINGS) ×3 IMPLANT
GLOVE BIOGEL PI IND STRL 7.0 (GLOVE) ×1 IMPLANT
GLOVE BIOGEL PI IND STRL 8 (GLOVE) ×2 IMPLANT
GLOVE BIOGEL PI INDICATOR 7.0 (GLOVE) ×2
GLOVE BIOGEL PI INDICATOR 8 (GLOVE) ×4
GLOVE SS BIOGEL STRL SZ 7.5 (GLOVE) ×1 IMPLANT
GLOVE SUPERSENSE BIOGEL SZ 7.5 (GLOVE) ×2
GLOVE SURG SS PI 7.0 STRL IVOR (GLOVE) ×3 IMPLANT
GOWN STRL REUS W/ TWL LRG LVL3 (GOWN DISPOSABLE) ×1 IMPLANT
GOWN STRL REUS W/ TWL XL LVL3 (GOWN DISPOSABLE) ×1 IMPLANT
GOWN STRL REUS W/TWL LRG LVL3 (GOWN DISPOSABLE) ×2
GOWN STRL REUS W/TWL XL LVL3 (GOWN DISPOSABLE) ×2
KIT BASIN OR (CUSTOM PROCEDURE TRAY) ×3 IMPLANT
KIT ROOM TURNOVER OR (KITS) ×3 IMPLANT
LIQUID BAND (GAUZE/BANDAGES/DRESSINGS) ×3 IMPLANT
NS IRRIG 1000ML POUR BTL (IV SOLUTION) ×6 IMPLANT
PACK GENERAL/GYN (CUSTOM PROCEDURE TRAY) ×3 IMPLANT
PAD ARMBOARD 7.5X6 YLW CONV (MISCELLANEOUS) ×3 IMPLANT
PLASMABLADE 3.0S (MISCELLANEOUS) ×3
SPECIMEN JAR LARGE (MISCELLANEOUS) ×3 IMPLANT
SPECIMEN JAR X LARGE (MISCELLANEOUS) IMPLANT
SPONGE GAUZE 4X4 12PLY STER LF (GAUZE/BANDAGES/DRESSINGS) ×3 IMPLANT
STAPLER VISISTAT 35W (STAPLE) ×3 IMPLANT
SUT ETHILON 2 0 FS 18 (SUTURE) ×3 IMPLANT
SUT MON AB 5-0 PS2 18 (SUTURE) ×3 IMPLANT
SUT VIC AB 3-0 SH 18 (SUTURE) ×3 IMPLANT
TAPE STRIPS DRAPE STRL (GAUZE/BANDAGES/DRESSINGS) IMPLANT
TOWEL OR 17X24 6PK STRL BLUE (TOWEL DISPOSABLE) IMPLANT
TOWEL OR 17X26 10 PK STRL BLUE (TOWEL DISPOSABLE) ×3 IMPLANT
TUBE CONNECTING 12'X1/4 (SUCTIONS) ×1
TUBE CONNECTING 12X1/4 (SUCTIONS) ×2 IMPLANT

## 2014-11-11 NOTE — H&P (Signed)
[name] 10/17/2014 10:43 AM Location: [facility] Patient #: [id number] DOB: 14-Jul-1946 Single / Language: [name] / Race: White Male  History of Present Illness [name]; 10/17/2014 11:58 AM) Patient words: Rt breast.  The patient is a 69 year old male who presents with breast cancer. He has a PMH of CAD (s/p CABG x 4 in 2001, normal Myoview 2010), chronic diastolic CHF, chronic a-fib, DM, HTN, HL, carotid artery disease, COPD, hypothyroidism, EtOH abuse, chronic venous insufficiency, CKD, and morbid obesity. He has not been on anticoagulants due to ETOH abuse and fall risk. The patient underwent a CT scan of the chest on July 9 due to an episode of hemoptysis and shortness of breath. As far as his lungs this showed an indeterminate small 8 x 5 mm nodular opacity in the posterior right lower lobe. CT followup in approximately 6 months was recommended for this indeterminate lesion. However also seen was a nodular density in the right breast and further workup with mammogram and ultrasound was recommended. This was performed at the breast center. Mammogram and ultrasound revealed a 1.2 cm irregular mass in the right breast. Ultrasound-guided core biopsy was performed. This is revealed a grade 1-2, ER PR positive, HER-2/neu negative invasive ductal carcinoma. I initially evaluated him in August of last year and planned right total mastectomy with sentinel lymph node biopsy. Surgery however has been delayed on a couple of occasions when he was admitted with severe heart failure and a small stroke and then subsequently he also has been hospitalized for an enterocutaneous fistula at the umbilicus with a history of emergency incarcerated umbilical hernia repair in spring of last year. He currently is out of the hospital and feeling better. Still has some intermittent drainage at his umbilicus that is being followed by [name]. He has been cleared by cardiology at  moderate increased risk of complications. CT scan of the chest was done recently to follow-up a small pulmonary nodule which was no longer seen but his breast cancer had a large slightly. Several weeks ago as surgery was being delayed he was started on tamoxifen 20 mg daily after being seen at the breast center.   Vitals [name]; 10/17/2014 10:44 AM) 10/17/2014 10:44 AM Weight: 233 lb Height: 67in Body Surface Area: 2.24 m Body Mass Index: 36.49 kg/m Temp.: 98.71F  Pulse: 74 (Regular)  BP: 120/80 (Sitting, Left Arm, Standard)    Physical Exam [name]; 10/17/2014 11:59 AM) The physical exam findings are as follows: Note:General: Moderately obese, chronically ill-appearing Caucasian male ambulatory with a walker Lymph nodes: No cervical, subclavicular or axillary nodes palpable Lungs: Clear breath sounds without increased work of breathing Cardiac: Regular rate and rhythm. 2+ lower extremity edema Breasts: There is approximately 2 cm palpable mass near the right nipple with some generalized gynecomastia. Abdomen: Diastases. Obese. Granulation tissue at the umbilicus with slight tan drainage. Probably somewhat of a recurrent hernia. Umbilicus. Extremities: Chronic venous stasis changes and 2+ edema Neurologic: Alert and fully oriented. No gross motor deficits    Assessment & Plan [name]; 10/17/2014 12:01 PM) BREAST CANCER IN MALE (175.9  C50.929) Impression: I discussed options with the patient and his daughter today of proceeding with surgical management for his breast cancer or nonsurgical management with tamoxifen alone due to his multiple comorbidities. He is at increased risk for surgery but probably not prohibitive risk. We discussed that the cancer may not ever bother him on tamoxifen  alone or could grow and become a problem. After discussion he would like to have mastectomy which I think is reasonable. I will discuss with Dr.  Sonny [name] if he feels a sentinel lymph node biopsy is indicated or would affect his treatment. He is to see [name] within a couple of weeks and discuss this he wants to have abdominal exploration for his fistula and possibly we could combine this with his mastectomy although this would be significantly increased anesthesia time and surgery compared to mastectomy alone. We will schedule following that appointment. Current Plans  Instructed to keep follow-up appointment as scheduled Instructions: We will plan to schedule your surgery after your evaluation by [name] Instructions:   Signed by [name] Jolly, [name] (10/17/2014 12:01 PM)

## 2014-11-11 NOTE — Interval H&P Note (Signed)
History and Physical Interval Note:  11/11/2014 10:32 AM  [name]  has presented today for surgery, with the diagnosis of cancer right breast  The various methods of treatment have been discussed with the patient and family. After consideration of risks, benefits and other options for treatment, the patient has consented to  Procedure(s): RIGHT TOTAL MASTECTOMY (Right) as a surgical intervention .  The patient's history has been reviewed, patient examined, no change in status, stable for surgery.  I have reviewed the patient's chart and labs.  Questions were answered to the patient's satisfaction.     Tavien Chestnut T

## 2014-11-11 NOTE — Anesthesia Procedure Notes (Addendum)
Anesthesia Regional Block:  Pectoralis block  Pre-Anesthetic Checklist: ,, timeout performed, Correct Patient, Correct Site, Correct Laterality, Correct Procedure, Correct Position, site marked, Risks and benefits discussed,  Surgical consent,  Pre-op evaluation,  At surgeon's request and post-op pain management  Laterality: Right and Upper  Prep: Maximum Sterile Barrier Precautions used and chloraprep       Needles:   Needle Type: Echogenic Stimulator Needle     Needle Length: 10cm 10 cm Needle Gauge: 21 and 21 G    Additional Needles:  Procedures: ultrasound guided (picture in chart) Pectoralis block Narrative:  Start time: 11/11/2014 9:45 AM End time: 11/11/2014 9:53 AM  Performed by: Personally  Anesthesiologist: Alexis Frock  Additional Notes: R PECS 2 block with 63ml .5% marcaine with epi, multiple asp, US guided, talked to patient throughout, no complications   Procedure Name: Intubation Date/Time: 11/11/2014 10:55 AM Performed by: Julian Reil Pre-anesthesia Checklist: Patient identified, Emergency Drugs available, Suction available and Patient being monitored Patient Re-evaluated:Patient Re-evaluated prior to inductionOxygen Delivery Method: Circle system utilized Preoxygenation: Pre-oxygenation with 100% oxygen Intubation Type: IV induction Ventilation: Mask ventilation without difficulty Laryngoscope Size: Mac and 3 Grade View: Grade II Tube type: Oral Tube size: 8.0 mm Number of attempts: 1 Airway Equipment and Method: Stylet Placement Confirmation: ETT inserted through vocal cords under direct vision,  positive ETCO2 and breath sounds checked- equal and bilateral Secured at: 22 cm Tube secured with: Tape Dental Injury: Teeth and Oropharynx as per pre-operative assessment

## 2014-11-11 NOTE — Transfer of Care (Signed)
Immediate Anesthesia Transfer of Care Note  Patient: [name]  Procedure(s) Performed: Procedure(s): RIGHT TOTAL MASTECTOMY (Right)  Patient Location: PACU  Anesthesia Type:GA combined with regional for post-op pain  Level of Consciousness: awake, alert , oriented and patient cooperative  Airway & Oxygen Therapy: Patient Spontanous Breathing and Patient connected to face mask oxygen  Post-op Assessment: Report given to RN, Post -op Vital signs reviewed and stable and Patient moving all extremities  Post vital signs: Reviewed and stable  Last Vitals:  Filed Vitals:   11/11/14 0950  BP: 147/78  Pulse: 69  Temp:   Resp: 22    Complications: No apparent anesthesia complications

## 2014-11-11 NOTE — Anesthesia Postprocedure Evaluation (Signed)
  Anesthesia Post-op Note  Patient: [name]  Procedure(s) Performed: Procedure(s): RIGHT TOTAL MASTECTOMY (Right)  Patient Location: PACU  Anesthesia Type:General  Level of Consciousness: awake, alert  and oriented  Airway and Oxygen Therapy: Patient Spontanous Breathing and Patient connected to nasal cannula oxygen  Post-op Pain: mild  Post-op Assessment: Post-op Vital signs reviewed and Patient's Cardiovascular Status Stable  Post-op Vital Signs: Reviewed and stable  Last Vitals:  Filed Vitals:   11/11/14 1157  BP:   Pulse: 95  Temp: 36.5 C  Resp: 9    Complications: No apparent anesthesia complications

## 2014-11-11 NOTE — Op Note (Signed)
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 Due to intervening medical issues he has been unable to have surgery but has been on estrogen blockers with stabilization. We have recommended proceeding with right total mastectomy. We discussed the procedure and risks in detail documented elsewhere he is now brought to the operating room for this procedure.    Procedure Detail:  Patient was brought to the operating room, placed in supine position on the operating table, and laryngeal mask general anesthesia induced. He was positioned with his right arm extended and the entire right anterior chest and upper arm and axilla were widely sterilely prepped and draped. He received preoperative IV antibiotics. PAS were in place. Patient timeout was performed and correct procedure and laterality confirmed. I made an elliptical transverse incision encompassing the nipple and dissection was carried down to the saphenous tissue. Skin and subcutaneous flaps, fairly short, were raised superiorly, medially to the edge of the sternum, inferiorly to the inframammary crease and laterally out toward the anterior border of the latissimus. The breast was then reflected up off the chest wall. All dissection with the plasma blade. Final attachments along the lateral chest wall were divided and dissection carried up toward the axilla and I came across the high axilla with a Kelly clamp and the specimen was removed and oriented and sent for permanent pathology. This pedicle was suture ligated. Palpation of the axilla revealed no evidence of  adenopathy. The wound was thoroughly irrigated and hemostasis obtained. A 19 Blake closed suction drain was left through a separate stab wound under both flaps. The subtenon's tissue was closed with interrupted 3-0 Vicryl and skin with staples. Sponge needle and instrument counts were correct.   Estimated Blood Loss:  less than 50 mL         Drains: 19 round Blake  Blood Given: none          Specimens: Right total mastectomy        Complications:  * No complications entered in OR log *         Disposition: PACU - hemodynamically stable.         Condition: stable

## 2014-11-12 ENCOUNTER — Encounter (HOSPITAL_COMMUNITY): Payer: Self-pay | Admitting: General Surgery

## 2014-11-12 DIAGNOSIS — C50921 Malignant neoplasm of unspecified site of right male breast: Secondary | ICD-10-CM | POA: Diagnosis not present

## 2014-11-12 LAB — BASIC METABOLIC PANEL
ANION GAP: 4 — AB (ref 5–15)
BUN: 45 mg/dL — ABNORMAL HIGH (ref 6–23)
CALCIUM: 8.6 mg/dL (ref 8.4–10.5)
CHLORIDE: 97 mmol/L (ref 96–112)
CO2: 34 mmol/L — AB (ref 19–32)
Creatinine, Ser: 1.04 mg/dL (ref 0.50–1.35)
GFR calc Af Amer: 82 mL/min — ABNORMAL LOW (ref >90)
GFR calc non Af Amer: 71 mL/min — ABNORMAL LOW (ref >90)
GLUCOSE: 92 mg/dL (ref 70–99)
Potassium: 4.7 mmol/L (ref 3.5–5.1)
SODIUM: 135 mmol/L (ref 135–145)

## 2014-11-12 LAB — CBC
HEMATOCRIT: 33.7 % — AB (ref 39.0–52.0)
HEMOGLOBIN: 10.5 g/dL — AB (ref 13.0–17.0)
MCH: 30.4 pg (ref 26.0–34.0)
MCHC: 31.2 g/dL (ref 30.0–36.0)
MCV: 97.7 fL (ref 78.0–100.0)
Platelets: 104 10*3/uL — ABNORMAL LOW (ref 150–400)
RBC: 3.45 MIL/uL — AB (ref 4.22–5.81)
RDW: 16.5 % — ABNORMAL HIGH (ref 11.5–15.5)
WBC: 5.1 10*3/uL (ref 4.0–10.5)

## 2014-11-12 LAB — GLUCOSE, CAPILLARY
GLUCOSE-CAPILLARY: 141 mg/dL — AB (ref 70–99)
Glucose-Capillary: 88 mg/dL (ref 70–99)

## 2014-11-12 MED ORDER — OXYCODONE HCL 10 MG PO TABS: 10.0000 mg | ORAL_TABLET | Freq: Four times a day (QID) | ORAL | Status: DC | PRN

## 2014-11-12 NOTE — Discharge Instructions (Signed)
[facility] °[phone] ° °MASTECTOMY: POST OP INSTRUCTIONS ° °Always review your discharge instruction sheet given to you by the facility where your surgery was performed. °IF YOU HAVE DISABILITY OR FAMILY LEAVE FORMS, YOU MUST BRING THEM TO THE OFFICE FOR PROCESSING.   °DO NOT GIVE THEM TO YOUR DOCTOR. °A prescription for pain medication may be given to you upon discharge.  Take your pain medication as prescribed, if needed.  If narcotic pain medicine is not needed, then you may take acetaminophen (Tylenol) or ibuprofen (Advil) as needed. °1. Take your usually prescribed medications unless otherwise directed. °2. If you need a refill on your pain medication, please contact your pharmacy.  They will contact our office to request authorization.  Prescriptions will not be filled after 5pm or on week-ends. °3. You should follow a light diet the first few days after arrival home, such as soup and crackers, etc.  Resume your normal diet the day after surgery. °4. Most patients will experience some swelling and bruising on the chest and underarm.  Ice packs will help.  Swelling and bruising can take several days to resolve.  °5. It is common to experience some constipation if taking pain medication after surgery.  Increasing fluid intake and taking a stool softener (such as Colace) will usually help or prevent this problem from occurring.  A mild laxative (Milk of Magnesia or Miralax) should be taken according to package instructions if there are no bowel movements after 48 hours. °6. Unless discharge instructions indicate otherwise, leave your bandage dry and in place until your next appointment in 3-5 days.  You may take a limited sponge bath.  No tube baths or showers until the drains are removed.  You may have steri-strips (small skin tapes) in place directly over the incision.  These strips should be left on the skin for 7-10 days.  If your surgeon used skin glue on the incision, you may  shower in 24 hours.  The glue will flake off over the next 2-3 weeks.  Any sutures or staples will be removed at the office during your follow-up visit. °7. DRAINS:  If you have drains in place, it is important to keep a list of the amount of drainage produced each day in your drains.  Before leaving the hospital, you should be instructed on drain care.  Call our office if you have any questions about your drains. °8. ACTIVITIES:  You may resume regular (light) daily activities beginning the next day--such as daily self-care, walking, climbing stairs--gradually increasing activities as tolerated.  You may have sexual intercourse when it is comfortable.  Refrain from any heavy lifting or straining until approved by your doctor. °a. You may drive when you are no longer taking prescription pain medication, you can comfortably wear a seatbelt, and you can safely maneuver your car and apply brakes. °b. RETURN TO WORK:  __________________________________________________________ °9. You should see your doctor in the office for a follow-up appointment approximately 3-5 days after your surgery.  Your doctor’s nurse will typically make your follow-up appointment when she calls you with your pathology report.  Expect your pathology report 2-3 business days after your surgery.  You may call to check if you do not hear from us after three days.   °10. OTHER INSTRUCTIONS: ______________________________________________________________________________________________ ____________________________________________________________________________________________ °WHEN TO CALL YOUR DOCTOR: °1. Fever over 101.0 °2. Nausea and/or vomiting °3. Extreme swelling or bruising °4. Continued bleeding from incision. °5. Increased pain, redness, or drainage from the incision. °  The clinic staff is available to answer your questions during regular business hours.  Please don’t hesitate to call and ask to speak to one of the nurses for clinical  concerns.  If you have a medical emergency, go to the nearest emergency room or call 911.  A surgeon from [facility] is always on call at the hospital. °[address], [address], [place], [place]  [address] ? [address] [address], [place], [place]   [address] °[phone] ? [phone] ? FAX [phone] °Web site: [email or web address] °

## 2014-11-12 NOTE — Discharge Summary (Signed)
Patient ID: [name] [id number] 69 y.o. 02/25/1946  11/11/2014  Discharge date and time: 11/12/2014   Admitting Physician: Excell Seltzer T  Discharge Physician: Excell Seltzer T  Admission Diagnoses: cancer right breast  Discharge Diagnoses: Same  Operations: Procedure(s): RIGHT TOTAL MASTECTOMY  Admission Condition: fair  Discharged Condition: fair  Indication for Admission: Patient is a 69 year old with multiple medical problems who presented with a right breast mass last fall. Workup revealed an approximately 2 cm mass in the right breast and core biopsy has revealed invasive ductal carcinoma, ER/PR positive. Due to intercurrent medical problems his surgery was delayed on the couple of occasions. He has been on tamoxifen with stable or slightly decreased mass on follow-up. He now is admitted overnight for observation following planned right total mastectomy  Hospital Course: Patient underwent an uneventful right total mastectomy. Tolerated the procedure without incident. The following morning he is comfortable. Vital signs are within normal limits. Electrolytes and CBC unremarkable with slight expected decline in hemoglobin. Wound is clean without swelling or evidence of bleeding and JP drainage is clearing. He is felt ready for discharge home.   Disposition: Home  Patient Instructions:    Medication List    STOP taking these medications        doxycycline 100 MG capsule  Commonly known as:  VIBRAMYCIN      TAKE these medications        allopurinol 300 MG tablet  Commonly known as:  ZYLOPRIM  Take 1 tablet (300 mg total) by mouth daily.     aspirin 325 MG EC tablet  Take 1 tablet (325 mg total) by mouth daily.     atorvastatin 20 MG tablet  Commonly known as:  LIPITOR  Take 1 tablet (20 mg total) by mouth daily.     carvedilol 3.125 MG tablet  Commonly known as:  COREG  Take 1 tablet (3.125 mg total) by mouth 2 (two) times daily with a meal.      colchicine 0.6 MG tablet  Take 1 tablet (0.6 mg total) by mouth 2 (two) times daily.     FLUoxetine 20 MG capsule  Commonly known as:  PROZAC  Take 1 capsule (20 mg total) by mouth daily.     Fluticasone Furoate 200 MCG/ACT Aepb  Inhale 1 puff into the lungs 2 (two) times daily.     folic acid 1 MG tablet  Commonly known as:  FOLVITE  Take 1 tablet (1 mg total) by mouth daily.     furosemide 80 MG tablet  Commonly known as:  LASIX  Take 2 tablets in the AM and 1 in the PM     gabapentin 300 MG capsule  Commonly known as:  NEURONTIN  Take 1 capsule (300 mg total) by mouth 2 (two) times daily.     insulin glargine 100 UNIT/ML injection  Commonly known as:  LANTUS  Inject 0.08 mLs (8 Units total) into the skin at bedtime.     lactulose 10 GM/15ML solution  Commonly known as:  CHRONULAC  Take 30 mLs (20 g total) by mouth 2 (two) times daily.     levothyroxine 125 MCG tablet  Commonly known as:  SYNTHROID, LEVOTHROID  Take 1.5 tablets (187 mcg total) by mouth daily before breakfast.     metolazone 5 MG tablet  Commonly known as:  ZAROXOLYN  Take 1 tablet (5 mg total) by mouth once a week. Take on Friday  and as needed. Take with an extra 20 meq  of potassium     multivitamin with minerals Tabs tablet  Take 1 tablet by mouth daily.     omeprazole 20 MG capsule  Commonly known as:  PRILOSEC  Take 1 capsule (20 mg total) by mouth daily.     Oxycodone HCl 10 MG Tabs  Take 1 tablet (10 mg total) by mouth every 6 (six) hours as needed.     potassium chloride SA 20 MEQ tablet  Commonly known as:  K-DUR,KLOR-CON  Take 3 tablets (60 mEq total) by mouth daily.     tamoxifen 20 MG tablet  Commonly known as:  NOLVADEX  Take 1 tablet (20 mg total) by mouth daily.     venlafaxine XR 37.5 MG 24 hr capsule  Commonly known as:  EFFEXOR-XR  Take 1 capsule (37.5 mg total) by mouth daily.        Activity: activity as tolerated Diet: diabetic diet Wound Care: empty JP drain   daily and record amount  Follow-up:  With Dr. Excell Seltzer in 1 week.  Signed: Edward Jolly MD, FACS  11/12/2014, 10:05 AM

## 2014-11-13 ENCOUNTER — Inpatient Hospital Stay: Admission: RE | Admit: 2014-11-13 | Payer: Medicare Other | Source: Ambulatory Visit

## 2014-11-29 ENCOUNTER — Other Ambulatory Visit: Payer: Self-pay | Admitting: *Deleted

## 2014-11-29 MED ORDER — FUROSEMIDE 80 MG PO TABS: ORAL_TABLET | ORAL | Status: DC

## 2014-12-02 ENCOUNTER — Other Ambulatory Visit: Payer: Self-pay

## 2014-12-03 ENCOUNTER — Telehealth: Payer: Self-pay | Admitting: Hematology and Oncology

## 2014-12-03 NOTE — Telephone Encounter (Signed)
, °

## 2014-12-11 ENCOUNTER — Ambulatory Visit: Payer: Medicare Other | Admitting: Hematology and Oncology

## 2014-12-11 ENCOUNTER — Telehealth: Payer: Self-pay | Admitting: Hematology and Oncology

## 2014-12-11 ENCOUNTER — Other Ambulatory Visit: Payer: Self-pay

## 2014-12-11 NOTE — Assessment & Plan Note (Signed)
Right breast mastectomy: Invasive ductal carcinoma grade 3, 2 cm, DCIS high-grade, margins negative, ER 100%, PR 77%, HER-2 negative ratio 1.45, Ki-67 51% T1 cN0 M0 stage IA, diagnosed in August 2015 and treated with neoadjuvant tamoxifen until he became surgical candidate.  Pathology review: I discussed the final pathology report in great detail with the patient after explaining the final pathologic stage. He also underwent ventral hernia repair surgery at the same time.  Recommendation: Resume adjuvant antiestrogen therapy with tamoxifen for 10 years. Patient had previously tolerated it fairly well without any major problems. Return to clinic in 3 months for follow-up

## 2014-12-11 NOTE — Telephone Encounter (Signed)
Ret daughter call to cx todays appts as he is not feeling well,he has an appt in place 4/13 and they req to keep this appt,she /he declined as sooner appt in a week or so,lm for desk nurse with this information  anne

## 2015-01-22 ENCOUNTER — Telehealth: Payer: Self-pay | Admitting: Hematology and Oncology

## 2015-01-22 ENCOUNTER — Ambulatory Visit (HOSPITAL_BASED_OUTPATIENT_CLINIC_OR_DEPARTMENT_OTHER): Payer: Medicare Other | Admitting: Hematology and Oncology

## 2015-01-22 VITALS — BP 134/58 | HR 77 | Temp 97.5°F | Resp 18 | Ht 69.5 in | Wt 243.8 lb

## 2015-01-22 DIAGNOSIS — I509 Heart failure, unspecified: Secondary | ICD-10-CM | POA: Diagnosis not present

## 2015-01-22 DIAGNOSIS — C50921 Malignant neoplasm of unspecified site of right male breast: Secondary | ICD-10-CM

## 2015-01-22 DIAGNOSIS — J449 Chronic obstructive pulmonary disease, unspecified: Secondary | ICD-10-CM

## 2015-01-22 DIAGNOSIS — N183 Chronic kidney disease, stage 3 (moderate): Secondary | ICD-10-CM | POA: Diagnosis not present

## 2015-01-22 DIAGNOSIS — I639 Cerebral infarction, unspecified: Secondary | ICD-10-CM | POA: Diagnosis not present

## 2015-01-22 NOTE — Telephone Encounter (Signed)
Appointments made and avs printed for patient °

## 2015-01-22 NOTE — Progress Notes (Signed)
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   Staging form: Breast, AJCC 7th Edition     Clinical stage from 05/29/2014: Stage IA (T1c, N0, cM0) - Unsigned       Prognostic indicators: ER/PR POS, HER 2 NEG      Pathologic stage from 11/13/2014: Stage Unknown (yT1c, NX, cM0) - Signed by Seward Grater, MD on 11/19/2014       Staging comments: Staged on final mastectomy specimen by Dr. 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 He underwent mastectomy that showed stage IA invasive ductal carcinoma that was ER/PR positive HER-2 negative. His recovered extremely well from the surgery and is here for follow-up. He complains of a cough and leg swelling. His leg edema is chronic in nature. He uses a wheelchair for ambulation.  REVIEW OF SYSTEMS:   Constitutional: Denies fevers, chills or abnormal weight loss Eyes: Denies blurriness of vision Ears, nose, mouth, throat, and face: Denies mucositis or sore  throat Respiratory: Shortness of breath and wheezing Cardiovascular: Denies palpitation, chest discomfort  Gastrointestinal:  Denies nausea, heartburn or change in bowel habits Skin: Denies abnormal skin rashes Lymphatics: Denies new lymphadenopathy or easy bruising Neurological:Denies numbness, tingling or new weaknesses Behavioral/Psych: Mood is stable, no new changes  Breast: Healed very well from the scar from surgery All other systems were reviewed with the patient and are negative.  I have reviewed the past medical history, past surgical history, social history and family history with the patient and they are unchanged from previous note.  ALLERGIES:  is allergic to erythromycin.  MEDICATIONS:  Current Outpatient Prescriptions  Medication Sig Dispense Refill  . allopurinol (ZYLOPRIM) 300 MG tablet Take 1 tablet (300 mg total) by mouth daily. 30 tablet 3  . aspirin EC 325 MG EC tablet Take 1 tablet (325 mg total) by mouth daily. 30 tablet 0  . atorvastatin (LIPITOR) 20 MG tablet Take 1 tablet (20 mg total) by mouth daily. 30 tablet 1  . carvedilol (COREG) 3.125 MG tablet Take 1 tablet (3.125 mg total) by mouth 2 (two) times daily with a meal. 60 tablet 5  . colchicine 0.6 MG tablet Take 1 tablet (0.6 mg total) by mouth 2 (two) times daily. 30 tablet 0  . FLUoxetine (PROZAC) 20 MG capsule Take 1 capsule (20 mg total) by mouth daily. 30 capsule 3  . Fluticasone Furoate 200 MCG/ACT AEPB Inhale 1 puff into the lungs 2 (two) times daily. 30 each 1  . folic acid (FOLVITE) 1 MG tablet  Take 1 tablet (1 mg total) by mouth daily. 30 tablet 1  . furosemide (LASIX) 80 MG tablet Take 2 tablets in the AM and 1 in the PM 90 tablet 3  . gabapentin (NEURONTIN) 300 MG capsule Take 1 capsule (300 mg total) by mouth 2 (two) times daily. 60 capsule 1  . insulin glargine (LANTUS) 100 UNIT/ML injection Inject 0.08 mLs (8 Units total) into the skin at bedtime. (Patient taking differently: Inject 14 Units  into the skin at bedtime. ) 10 mL 11  . lactulose (CHRONULAC) 10 GM/15ML solution Take 30 mLs (20 g total) by mouth 2 (two) times daily. (Patient taking differently: Take 20 g by mouth 2 (two) times daily as needed for mild constipation or moderate constipation. ) 240 mL 0  . levothyroxine (SYNTHROID, LEVOTHROID) 125 MCG tablet Take 1.5 tablets (187 mcg total) by mouth daily before breakfast. 30 tablet 3  . metolazone (ZAROXOLYN) 5 MG tablet Take 1 tablet (5 mg total) by mouth once a week. Take on Friday  and as needed. Take with an extra 20 meq of potassium (Patient taking differently: Take 5 mg by mouth once a week. On Friday) 12 tablet 6  . Multiple Vitamin (MULTIVITAMIN WITH MINERALS) TABS tablet Take 1 tablet by mouth daily.    Marland Kitchen omeprazole (PRILOSEC) 20 MG capsule Take 1 capsule (20 mg total) by mouth daily. 30 capsule 1  . Oxycodone HCl 10 MG TABS Take 1 tablet (10 mg total) by mouth every 6 (six) hours as needed. 40 tablet 0  . potassium chloride SA (K-DUR,KLOR-CON) 20 MEQ tablet Take 3 tablets (60 mEq total) by mouth daily. 90 tablet 6  . tamoxifen (NOLVADEX) 20 MG tablet Take 1 tablet (20 mg total) by mouth daily. 90 tablet 3  . venlafaxine XR (EFFEXOR-XR) 37.5 MG 24 hr capsule Take 1 capsule (37.5 mg total) by mouth daily. 30 capsule 3   No current facility-administered medications for this visit.    PHYSICAL EXAMINATION: ECOG PERFORMANCE STATUS: 1 - Symptomatic but completely ambulatory  Filed Vitals:   01/22/15 1414  BP: 134/58  Pulse: 77  Temp: 97.5 F (36.4 C)  Resp: 18   Filed Weights   01/22/15 1414  Weight: 243 lb 12.8 oz (110.587 kg)    GENERAL:alert, no distress and comfortable SKIN: skin color, texture, turgor are normal, no rashes or significant lesions EYES: normal, Conjunctiva are pink and non-injected, sclera clear OROPHARYNX:no exudate, no erythema and lips, buccal mucosa, and tongue normal  NECK: supple, thyroid normal size, non-tender, without  nodularity LYMPH:  no palpable lymphadenopathy in the cervical, axillary or inguinal LUNGS: Fine crackles at lung bases with normal breathing effort HEART: regular rate & rhythm and no murmurs 3+ lower extremity edema worse on the left ABDOMEN:abdomen soft, non-tender and normal bowel sounds Musculoskeletal:no cyanosis of digits and no clubbing  NEURO: alert & oriented x 3 with fluent speech, no focal motor/sensory deficits BREAST very well healed from recent surgery (exam performed in the presence of a chaperone)  LABORATORY DATA:  I have reviewed the data as listed   Chemistry      Component Value Date/Time   NA 135 11/12/2014 0511   NA 138 10/22/2014 1407   K 4.7 11/12/2014 0511   K 3.9 10/22/2014 1407   CL 97 11/12/2014 0511   CO2 34* 11/12/2014 0511   CO2 30* 10/22/2014 1407   BUN 45* 11/12/2014 0511   BUN 55.3* 10/22/2014 1407   CREATININE 1.04 11/12/2014 0511  CREATININE 1.4* 10/22/2014 1407      Component Value Date/Time   CALCIUM 8.6 11/12/2014 0511   CALCIUM 8.6 10/22/2014 1407   ALKPHOS 61 11/11/2014 0827   ALKPHOS 68 10/22/2014 1407   AST 18 11/11/2014 0827   AST 16 10/22/2014 1407   ALT 12 11/11/2014 0827   ALT 6 10/22/2014 1407   BILITOT 0.6 11/11/2014 0827   BILITOT 0.46 10/22/2014 1407       Lab Results  Component Value Date   WBC 5.1 11/12/2014   HGB 10.5* 11/12/2014   HCT 33.7* 11/12/2014   MCV 97.7 11/12/2014   PLT 104* 11/12/2014   NEUTROABS 3.5 10/22/2014    ASSESSMENT & PLAN:  Cancer of right male breast Right breast mastectomy 11/11/2014: Invasive ductal carcinoma grade 3, 2 cm, DCIS high-grade, margins negative, ER 100%, PR 77%, HER-2 negative ratio 1.45, Ki-67 51% T1 cN0 M0 stage IA (Right breast invasive ductal carcinoma ER/PR positive HER-2 negative, tumor size 1.9 cm this and the CT scan dated 08/30/2014. T1 C. N0 M0 stage IA. This was diagnosed August 2015. 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 Multiple comorbidities including CKD stage III, CHF, COPD, chronic venous stasis, recent CVA  Return to clinic in 6 months for follow-up    No orders of the defined types were placed in this encounter.   The patient has a good understanding of the overall plan. he agrees with it. She will call with any problems that may develop before her next visit here.   Rulon Eisenmenger, MD

## 2015-01-22 NOTE — Assessment & Plan Note (Signed)
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 T1 C. N0 M0 stage IA. This was diagnosed August 2015. 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 Multiple comorbidities including CKD stage III, CHF, COPD, chronic venous stasis, recent CVA  Return to clinic in 6 months for follow-up

## 2015-02-24 ENCOUNTER — Other Ambulatory Visit: Payer: Self-pay | Admitting: Hematology and Oncology

## 2015-03-05 NOTE — Progress Notes (Signed)
Chief Complaint  Patient presents with  . Fatigue    History of Present Illness: 69 yo WM with history of CAD s/p 4V CABG 2001, atrial fibrillation, COPD, OSA, obesity, venous insufficiency and diastolic CHF here for cardiac follow up. He had bypass surgery in 2001. In 2010 he developed paroxysmal fibrillation and has been in persistent atrial fibrillation since then. He was not felt to be a Coumadin candidate because of alcohol consumption. He has been followed in the CHF clinic.  Admitted to [facility] 04/14/13 through 04/27/13 with massive volume overload. Sluggish diuresis with intermittent lasix and he was transitioned to Lasix drip and dopamine drip at 3 mcg per hour. Diuresis was sluggish until diamox added with marked improvement. Most recently admitted July 2015 and October 2015 with CHF exacerbation. Had a CVA while in rehab in October 2015. He was seen in the CHF clinic 08/08/14 and was stable. Cleared for mastectomy at this visit. Stress myoview August 2015 without ischemia.  Moderate bilateral ICA stenosis, 60-79% by dopplers 2014 and repeat October 2015 with poor quality, velocity not felt to be increased in either carotid.   He is here today for follow up. No change in breathing. No chest pain.  LE edema is stable. Weight is stable. Overall doing well but continuing to have issues with abdominal op site oozing. He has undergone surgery for breast cancer since his last visit here. Tolerating all meds. Still drinking daily alcohol.   Primary Care Physician: Jene Every  Last Lipid profile:   Lipid Panel     Component Value Date/Time   CHOL 100 08/02/2014 0524   TRIG 88 08/02/2014 0524   HDL 34* 08/02/2014 0524   CHOLHDL 2.9 08/02/2014 0524   VLDL 18 08/02/2014 0524   LDLCALC 48 08/02/2014 0524    Past Medical History  Diagnosis Date  . CHRONIC OBSTRUCTIVE PULMONARY DISEASE 06/20/2009  . CAROTID STENOSIS 06/20/2009    A. 08/2001 s/p L CEA;  B.   09/14/11 - Carotid U/S - 40-59%  bilateral stenosis, left CEA patch angioplasty is patent  . DM 06/20/2009  . CAD 06/20/2009    A.  08/2000 - s/p CABG x 4 - LIMA-LAD, Left Radial-OM, VG-DIAG, VG-RCA;  B. Neg. MV  2010  . HYPERLIPIDEMIA 06/20/2009  . HYPERTENSION 06/20/2009  . Hypothyroidism   . Low back pain   . Asthma     as child  . Pneumonia   . Persistent atrial fibrillation     Not felt to be coumadin candidate 2/2 ETOH use.  [name] History of tobacco abuse     remote - quit 1970  . Bilateral renal cysts   . Morbidly obese   . Chronic diastolic heart failure, NYHA class 3     Followed by CHF Clinic --> Echo 04/2014: EF 41-74%, Diastolic DysFxn - elevated LVEDP & LAP. Mod Dil LA & RA.  . Falls frequently   . Hx of cardiovascular stress test 05/2014    Lexiscan Myoview (8/15):  No ischemia; EF 63% - Normal Study  . Breast cancer, right breast   . Gout   . OBSTRUCTIVE SLEEP APNEA 06/20/2009    does not use cpap  . Stroke     found on MRI in Oct. 2015.   [name] Shortness of breath dyspnea     occasional  . Depression     takes Prozac  . Kidney disease     Stage 3  . GERD (gastroesophageal reflux disease)     takes  Prilosec  . Arthritis   . Neuromuscular disorder     neuropathy in both legs  . Constipation   . ETOH abuse     no alcohol since Oct. 2015  . 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 Carotid endarterectomy  2002    left  . Coronary artery bypass graft      x 4 - 2001  . Umbilical hernia repair N/A 01/28/2014    Procedure: HERNIA REPAIR UMBILICAL ADULT/INC;  Surgeon: Harl Bowie, MD;  Location: [place];  Service: General;  Laterality: N/A;  . Laparotomy N/A 01/28/2014    Procedure: EXPLORATORY LAPAROTOMY;  Surgeon: Harl Bowie, MD;  Location: [facility];  Service: General;  Laterality: N/A;  . Bowel resection N/A 01/28/2014    Procedure: SMALL BOWEL RESECTION;  Surgeon: Harl Bowie, MD;   Location: [place];  Service: General;  Laterality: N/A;  . Hernia repair    . Colonoscopy    . Total mastectomy Right 11/11/2014    Procedure: RIGHT TOTAL MASTECTOMY;  Surgeon: Excell Seltzer, MD;  Location: [facility];  Service: General;  Laterality: Right;    Current Outpatient Prescriptions  Medication Sig Dispense Refill  . allopurinol (ZYLOPRIM) 300 MG tablet Take 1 tablet (300 mg total) by mouth daily. 30 tablet 3  . aspirin EC 325 MG EC tablet Take 1 tablet (325 mg total) by mouth daily. 30 tablet 0  . atorvastatin (LIPITOR) 20 MG tablet Take 1 tablet (20 mg total) by mouth daily. 30 tablet 1  . carvedilol (COREG) 3.125 MG tablet Take 1 tablet (3.125 mg total) by mouth 2 (two) times daily with a meal. 60 tablet 5  . colchicine 0.6 MG tablet Take 0.6 mg by mouth daily.    [name] FLUoxetine (PROZAC) 20 MG capsule Take 1 capsule (20 mg total) by mouth daily. 30 capsule 3  . Fluticasone Furoate 200 MCG/ACT AEPB Inhale 1 puff into the lungs 2 (two) times daily. 30 each 1  . folic acid (FOLVITE) 1 MG tablet Take 1 tablet (1 mg total) by mouth daily. 30 tablet 1  . furosemide (LASIX) 80 MG tablet Take 2 tablets in the AM and 1 in the PM 90 tablet 3  . gabapentin (NEURONTIN) 300 MG capsule Take 1 capsule (300 mg total) by mouth 2 (two) times daily. 60 capsule 1  . insulin glargine (LANTUS) 100 UNIT/ML injection Inject 14 Units into the skin at bedtime.    [name] lactulose (CHRONULAC) 10 GM/15ML solution Take 30 mLs (20 g total) by mouth 2 (two) times daily. (Patient taking differently: Take 20 g by mouth 2 (two) times daily as needed for mild constipation or moderate constipation. ) 240 mL 0  . levothyroxine (SYNTHROID, LEVOTHROID) 125 MCG tablet Take 1.5 tablets (187 mcg total) by mouth daily before breakfast. 30 tablet 3  . metolazone (ZAROXOLYN) 5 MG tablet Take 5 mg by mouth every Monday, Wednesday, and Friday.    . Multiple Vitamin (MULTIVITAMIN WITH MINERALS) TABS tablet Take 1 tablet by mouth daily.      [name] omeprazole (PRILOSEC) 20 MG capsule Take 1 capsule (20 mg total) by mouth daily. 30 capsule 1  . Oxycodone HCl 10 MG TABS Take 1 tablet (10 mg total) by mouth every 6 (six) hours as needed. 40 tablet 0  . potassium chloride SA (K-DUR,KLOR-CON) 20 MEQ tablet Take 3 tablets (  60 mEq total) by mouth daily. 90 tablet 6  . tamoxifen (NOLVADEX) 20 MG tablet Take 1 tablet (20 mg total) by mouth daily. 90 tablet 3  . venlafaxine XR (EFFEXOR-XR) 37.5 MG 24 hr capsule TAKE ONE CAPSULE BY MOUTH DAILY 30 capsule 3   No current facility-administered medications for this visit.    Allergies  Allergen Reactions  . Erythromycin Hives    History   Social History  . Marital Status: Single    Spouse Name: N/A  . Number of Children: N/A  . Years of Education: N/A   Occupational History  . retired    Social History Main Topics  . Smoking status: Former Smoker -- 1.00 packs/day for 16 years    Quit date: 10/11/1968  . Smokeless tobacco: Never Used  . Alcohol Use: Yes     Comment: last drink was 2 weeks ago.  . Drug Use: Yes    Special: Marijuana     Comment: last 1 year ago  . Sexual Activity: No   Other Topics Concern  . Not on file   Social History Narrative   Lives in [place] with dtr, son-in-law    Family History  Problem Relation Age of Onset  . Stroke Mother     ?  [name] Cancer Mother     unknown type of cancer  . Stroke Father     ?  [name] Heart attack Neg Hx   . Cancer Brother 65    stomach cancer    Review of Systems:  As stated in the HPI and otherwise negative.   BP 98/68 mmHg  Pulse 78  Ht 5' 9.5" (1.765 m)  Wt 236 lb 12.8 oz (107.412 kg)  BMI 34.48 kg/m2  Physical Examination: General: Well developed, well nourished, NAD HEENT: OP clear, mucus membranes moist SKIN: warm, dry. No rashes. Neuro: No focal deficits Musculoskeletal: Muscle strength 5/5 all ext Psychiatric: Mood and affect normal Neck: No JVD, no carotid bruits, no thyromegaly, no  lymphadenopathy. Lungs:Clear bilaterally, no wheezes, rhonci, crackles Cardiovascular: Regular rate and rhythm. No murmurs, gallops or rubs. Abdomen:Soft. Bowel sounds present. Non-tender.  Extremities: Trace bilateral lower extremity edema. Chronic venous stasis changes.   Echo 04/21/14: Left ventricle: The cavity size was normal. Wall thickness was normal. Systolic function was normal. The estimated ejection fraction was in the range of 50% to 55%. Doppler parameters are consistent with both elevated ventricular end-diastolic filling pressure and elevated left atrial filling pressure. - Mitral valve: Calcified annulus. Mildly thickened leaflets . - Left atrium: Moderately to severely dilated. - Right ventricle: The cavity size was moderately dilated. - Right atrium: The atrium was moderately dilated. - Atrial septum: No defect or patent foramen ovale was identified.  EKG:  EKG is not ordered today. The ekg ordered today demonstrates   Recent Labs: 04/21/2014: Magnesium 1.7 07/21/2014: Pro B Natriuretic peptide (BNP) 3542.0* 10/02/2014: B Natriuretic Peptide 337.7* 10/03/2014: TSH 1.941 11/11/2014: ALT 12 11/12/2014: Hemoglobin 10.5*; Platelets 104* 03/06/2015: BUN 52*; Creatinine 1.22; Potassium 3.7; Sodium 136   Lipid Panel    Component Value Date/Time   CHOL 100 08/02/2014 0524   TRIG 88 08/02/2014 0524   HDL 34* 08/02/2014 0524   CHOLHDL 2.9 08/02/2014 0524   VLDL 18 08/02/2014 0524   LDLCALC 48 08/02/2014 0524     Wt Readings from Last 3 Encounters:  03/06/15 236 lb 12.8 oz (107.412 kg)  01/22/15 243 lb 12.8 oz (110.587 kg)  11/11/14 236 lb (107.049 kg)  Other studies Reviewed: Additional studies/ records that were reviewed today include: . Review of the above records demonstrates:    Assessment and Plan:   1. CAD: Stable. Stress myoview 06/27/13 without ischemia. LDL at goal <70 October 2015. Continue statin, beta blocker, ASA.     2. Atrial  fibrillation: Rate controlled. We discussed his stroke in July. He is still not felt to a good coumadin candidate due to ongoing alcohol use. He admits to drinking several times per week over the last two weeks, not every day as he had before. He understands that we could consider anti-coagulation in the future if he stopped drinking alcohol.   3. Chronic DIASTOLIC HEART FAILURE:  Followed in the CHF clinic but he has not been there October 2015. Weight stable. Using Lasix 160/80 and metolazone as needed. Check BMET   4. Carotid artery disease: Moderate bilateral ICA stenosis by carotid dopplers October 2015. Repeat now.   Current medicines are reviewed at length with the patient today.  The patient does not have concerns regarding medicines.  The following changes have been made:  no change  Labs/ tests ordered today include:   Orders Placed This Encounter  Procedures  . Basic Metabolic Panel (BMET)    Disposition:   FU with me in 6 months  Signed, Lauree Chandler, MD 03/06/2015 5:59 PM    [facility] [place] [place], [place], [place]  [address] Phone: [phone]; Fax: [phone]

## 2015-03-06 ENCOUNTER — Ambulatory Visit (INDEPENDENT_AMBULATORY_CARE_PROVIDER_SITE_OTHER): Payer: Medicare Other | Admitting: Cardiovascular Disease

## 2015-03-06 ENCOUNTER — Encounter: Payer: Self-pay | Admitting: Cardiovascular Disease

## 2015-03-06 VITALS — BP 98/68 | HR 78 | Ht 69.5 in | Wt 236.8 lb

## 2015-03-06 DIAGNOSIS — I6523 Occlusion and stenosis of bilateral carotid arteries: Secondary | ICD-10-CM | POA: Diagnosis not present

## 2015-03-06 DIAGNOSIS — I481 Persistent atrial fibrillation: Secondary | ICD-10-CM | POA: Diagnosis not present

## 2015-03-06 DIAGNOSIS — I5032 Chronic diastolic (congestive) heart failure: Secondary | ICD-10-CM

## 2015-03-06 DIAGNOSIS — I4819 Other persistent atrial fibrillation: Secondary | ICD-10-CM

## 2015-03-06 LAB — BASIC METABOLIC PANEL
BUN: 52 mg/dL — ABNORMAL HIGH (ref 6–23)
CO2: 39 mEq/L — ABNORMAL HIGH (ref 19–32)
Calcium: 8.7 mg/dL (ref 8.4–10.5)
Chloride: 91 mEq/L — ABNORMAL LOW (ref 96–112)
Creatinine, Ser: 1.22 mg/dL (ref 0.40–1.50)
GFR: 62.19 mL/min (ref >60.00)
GLUCOSE: 157 mg/dL — AB (ref 70–99)
POTASSIUM: 3.7 meq/L (ref 3.5–5.1)
Sodium: 136 mEq/L (ref 135–145)

## 2015-03-06 NOTE — Patient Instructions (Signed)
Medication Instructions:  Your physician recommends that you continue on your current medications as directed. Please refer to the Current Medication list given to you today.   Labwork: Lab work to be done today--BMP  Testing/Procedures: Your physician has requested that you have a carotid duplex. This test is an ultrasound of the carotid arteries in your neck. It looks at blood flow through these arteries that supply the brain with blood. Allow one hour for this exam. There are no restrictions or special instructions.    Follow-Up: Your physician wants you to follow-up in: 6 months.  You will receive a reminder letter in the mail two months in advance. If you don't receive a letter, please call our office to schedule the follow-up appointment.

## 2015-03-12 ENCOUNTER — Ambulatory Visit (HOSPITAL_COMMUNITY): Payer: Medicare Other | Attending: Cardiovascular Disease

## 2015-03-12 DIAGNOSIS — I6523 Occlusion and stenosis of bilateral carotid arteries: Secondary | ICD-10-CM | POA: Insufficient documentation

## 2015-04-02 ENCOUNTER — Other Ambulatory Visit: Payer: Medicare Other

## 2015-04-04 ENCOUNTER — Encounter
Admission: RE | Admit: 2015-04-04 | Discharge: 2015-04-04 | Disposition: A | Discharge status: Home / Self Care | Payer: Medicare Other | Source: Ambulatory Visit | Attending: Ophthalmology | Admitting: Ophthalmology

## 2015-04-04 DIAGNOSIS — H2512 Age-related nuclear cataract, left eye: Secondary | ICD-10-CM | POA: Diagnosis not present

## 2015-04-04 DIAGNOSIS — Z01812 Encounter for preprocedural laboratory examination: Secondary | ICD-10-CM | POA: Insufficient documentation

## 2015-04-04 DIAGNOSIS — C50929 Malignant neoplasm of unspecified site of unspecified male breast: Secondary | ICD-10-CM | POA: Diagnosis not present

## 2015-04-04 DIAGNOSIS — J449 Chronic obstructive pulmonary disease, unspecified: Secondary | ICD-10-CM | POA: Diagnosis not present

## 2015-04-04 DIAGNOSIS — N183 Chronic kidney disease, stage 3 (moderate): Secondary | ICD-10-CM | POA: Insufficient documentation

## 2015-04-04 DIAGNOSIS — E780 Pure hypercholesterolemia: Secondary | ICD-10-CM | POA: Insufficient documentation

## 2015-04-04 DIAGNOSIS — Z9981 Dependence on supplemental oxygen: Secondary | ICD-10-CM | POA: Diagnosis not present

## 2015-04-04 DIAGNOSIS — I10 Essential (primary) hypertension: Secondary | ICD-10-CM | POA: Insufficient documentation

## 2015-04-04 DIAGNOSIS — E119 Type 2 diabetes mellitus without complications: Secondary | ICD-10-CM | POA: Diagnosis not present

## 2015-04-04 DIAGNOSIS — I4891 Unspecified atrial fibrillation: Secondary | ICD-10-CM | POA: Diagnosis not present

## 2015-04-04 DIAGNOSIS — J45909 Unspecified asthma, uncomplicated: Secondary | ICD-10-CM | POA: Diagnosis not present

## 2015-04-04 LAB — POTASSIUM: Potassium: 3.4 mmol/L — ABNORMAL LOW (ref 3.5–5.1)

## 2015-04-07 ENCOUNTER — Encounter: Payer: Self-pay | Admitting: *Deleted

## 2015-04-08 ENCOUNTER — Other Ambulatory Visit: Payer: Self-pay | Admitting: Cardiovascular Disease

## 2015-04-08 ENCOUNTER — Ambulatory Visit: Payer: Medicare Other | Admitting: Anesthesiology

## 2015-04-08 ENCOUNTER — Ambulatory Visit
Admission: RE | Admit: 2015-04-08 | Discharge: 2015-04-08 | Disposition: A | Discharge status: Home / Self Care | Payer: Medicare Other | Source: Ambulatory Visit | Attending: Ophthalmology | Admitting: Ophthalmology

## 2015-04-08 ENCOUNTER — Encounter: Payer: Self-pay | Admitting: *Deleted

## 2015-04-08 ENCOUNTER — Encounter
Admission: RE | Disposition: A | Discharge status: Home / Self Care | Payer: Self-pay | Source: Ambulatory Visit | Attending: Ophthalmology

## 2015-04-08 DIAGNOSIS — I129 Hypertensive chronic kidney disease with stage 1 through stage 4 chronic kidney disease, or unspecified chronic kidney disease: Secondary | ICD-10-CM | POA: Insufficient documentation

## 2015-04-08 DIAGNOSIS — R251 Tremor, unspecified: Secondary | ICD-10-CM | POA: Diagnosis not present

## 2015-04-08 DIAGNOSIS — I4891 Unspecified atrial fibrillation: Secondary | ICD-10-CM | POA: Diagnosis not present

## 2015-04-08 DIAGNOSIS — K219 Gastro-esophageal reflux disease without esophagitis: Secondary | ICD-10-CM | POA: Diagnosis not present

## 2015-04-08 DIAGNOSIS — Z9981 Dependence on supplemental oxygen: Secondary | ICD-10-CM | POA: Diagnosis not present

## 2015-04-08 DIAGNOSIS — R0602 Shortness of breath: Secondary | ICD-10-CM | POA: Insufficient documentation

## 2015-04-08 DIAGNOSIS — Z8673 Personal history of transient ischemic attack (TIA), and cerebral infarction without residual deficits: Secondary | ICD-10-CM | POA: Diagnosis not present

## 2015-04-08 DIAGNOSIS — Z881 Allergy status to other antibiotic agents status: Secondary | ICD-10-CM | POA: Insufficient documentation

## 2015-04-08 DIAGNOSIS — M7989 Other specified soft tissue disorders: Secondary | ICD-10-CM | POA: Diagnosis not present

## 2015-04-08 DIAGNOSIS — Z853 Personal history of malignant neoplasm of breast: Secondary | ICD-10-CM | POA: Insufficient documentation

## 2015-04-08 DIAGNOSIS — J449 Chronic obstructive pulmonary disease, unspecified: Secondary | ICD-10-CM | POA: Insufficient documentation

## 2015-04-08 DIAGNOSIS — E1122 Type 2 diabetes mellitus with diabetic chronic kidney disease: Secondary | ICD-10-CM | POA: Insufficient documentation

## 2015-04-08 DIAGNOSIS — N183 Chronic kidney disease, stage 3 (moderate): Secondary | ICD-10-CM | POA: Insufficient documentation

## 2015-04-08 DIAGNOSIS — M109 Gout, unspecified: Secondary | ICD-10-CM | POA: Diagnosis not present

## 2015-04-08 DIAGNOSIS — F329 Major depressive disorder, single episode, unspecified: Secondary | ICD-10-CM | POA: Diagnosis not present

## 2015-04-08 DIAGNOSIS — G4730 Sleep apnea, unspecified: Secondary | ICD-10-CM | POA: Insufficient documentation

## 2015-04-08 DIAGNOSIS — I509 Heart failure, unspecified: Secondary | ICD-10-CM | POA: Diagnosis not present

## 2015-04-08 DIAGNOSIS — G629 Polyneuropathy, unspecified: Secondary | ICD-10-CM | POA: Insufficient documentation

## 2015-04-08 DIAGNOSIS — Z951 Presence of aortocoronary bypass graft: Secondary | ICD-10-CM | POA: Diagnosis not present

## 2015-04-08 DIAGNOSIS — E780 Pure hypercholesterolemia: Secondary | ICD-10-CM | POA: Diagnosis not present

## 2015-04-08 DIAGNOSIS — H2512 Age-related nuclear cataract, left eye: Secondary | ICD-10-CM | POA: Insufficient documentation

## 2015-04-08 HISTORY — PX: CATARACT EXTRACTION W/PHACO: SHX586

## 2015-04-08 HISTORY — DX: Dependence on supplemental oxygen: Z99.81

## 2015-04-08 LAB — GLUCOSE, CAPILLARY: Glucose-Capillary: 144 mg/dL — ABNORMAL HIGH (ref 65–99)

## 2015-04-08 SURGERY — PHACOEMULSIFICATION, CATARACT, WITH IOL INSERTION
Anesthesia: Monitor Anesthesia Care | Site: Eye | Laterality: Left

## 2015-04-08 MED ORDER — CARBACHOL 0.01 % IO SOLN
INTRAOCULAR | Status: DC | PRN
  Administered 2015-04-08: 0.5 mL via INTRAOCULAR

## 2015-04-08 MED ORDER — NA CHONDROIT SULF-NA HYALURON 40-17 MG/ML IO SOLN
INTRAOCULAR | Status: AC
  Filled 2015-04-08: qty 1

## 2015-04-08 MED ORDER — MOXIFLOXACIN HCL 0.5 % OP SOLN: 1.0000 [drp] | Freq: Once | OPHTHALMIC | Status: DC

## 2015-04-08 MED ORDER — EPINEPHRINE HCL 1 MG/ML IJ SOLN
INTRAOCULAR | Status: DC | PRN
  Administered 2015-04-08: 200 mL

## 2015-04-08 MED ORDER — CEFUROXIME OPHTHALMIC INJECTION 1 MG/0.1 ML
INJECTION | OPHTHALMIC | Status: AC
  Filled 2015-04-08: qty 0.1

## 2015-04-08 MED ORDER — CEFUROXIME OPHTHALMIC INJECTION 1 MG/0.1 ML
INJECTION | OPHTHALMIC | Status: DC | PRN
  Administered 2015-04-08: 0.1 mL via INTRACAMERAL

## 2015-04-08 MED ORDER — ARMC OPHTHALMIC DILATING GEL
1.0000 "application " | OPHTHALMIC | Status: DC | PRN
  Administered 2015-04-08: 1 via OPHTHALMIC

## 2015-04-08 MED ORDER — TETRACAINE HCL 0.5 % OP SOLN
OPHTHALMIC | Status: AC
  Administered 2015-04-08: 1 [drp] via OPHTHALMIC
  Filled 2015-04-08: qty 2

## 2015-04-08 MED ORDER — MOXIFLOXACIN HCL 0.5 % OP SOLN
OPHTHALMIC | Status: AC
  Filled 2015-04-08: qty 3

## 2015-04-08 MED ORDER — ARMC OPHTHALMIC DILATING GEL
OPHTHALMIC | Status: AC
Start: 2015-04-08 — End: 2015-04-08
  Administered 2015-04-08: 1 via OPHTHALMIC
  Filled 2015-04-08: qty 0.25

## 2015-04-08 MED ORDER — TETRACAINE HCL 0.5 % OP SOLN
1.0000 [drp] | OPHTHALMIC | Status: AC | PRN
  Administered 2015-04-08: 1 [drp] via OPHTHALMIC

## 2015-04-08 MED ORDER — POVIDONE-IODINE 5 % OP SOLN
1.0000 "application " | OPHTHALMIC | Status: AC | PRN
  Administered 2015-04-08: 1 via OPHTHALMIC

## 2015-04-08 MED ORDER — SODIUM CHLORIDE 0.9 % IV SOLN
INTRAVENOUS | Status: DC
  Administered 2015-04-08: 50 mL/h via INTRAVENOUS

## 2015-04-08 MED ORDER — POVIDONE-IODINE 5 % OP SOLN
OPHTHALMIC | Status: AC
Start: 2015-04-08 — End: 2015-04-08
  Administered 2015-04-08: 1 via OPHTHALMIC
  Filled 2015-04-08: qty 30

## 2015-04-08 MED ORDER — EPINEPHRINE HCL 1 MG/ML IJ SOLN
INTRAMUSCULAR | Status: AC
  Filled 2015-04-08: qty 1

## 2015-04-08 MED ORDER — IPRATROPIUM-ALBUTEROL 0.5-2.5 (3) MG/3ML IN SOLN
RESPIRATORY_TRACT | Status: AC
  Administered 2015-04-08: 3 mL
  Filled 2015-04-08: qty 3

## 2015-04-08 MED ORDER — MOXIFLOXACIN HCL 0.5 % OP SOLN
OPHTHALMIC | Status: DC | PRN
  Administered 2015-04-08: 1 [drp] via OPHTHALMIC

## 2015-04-08 MED ORDER — IPRATROPIUM-ALBUTEROL 0.5-2.5 (3) MG/3ML IN SOLN: 3.0000 mL | Freq: Four times a day (QID) | RESPIRATORY_TRACT | Status: DC

## 2015-04-08 SURGICAL SUPPLY — 22 items
CANNULA ANT/CHMB 27GA (MISCELLANEOUS) ×3 IMPLANT
GLOVE BIO SURGEON STRL SZ8 (GLOVE) ×3 IMPLANT
GLOVE BIOGEL M 6.5 STRL (GLOVE) ×3 IMPLANT
GLOVE SURG LX 8.0 MICRO (GLOVE) ×2
GLOVE SURG LX STRL 8.0 MICRO (GLOVE) ×1 IMPLANT
GOWN STRL REUS W/ TWL LRG LVL3 (GOWN DISPOSABLE) ×2 IMPLANT
GOWN STRL REUS W/TWL LRG LVL3 (GOWN DISPOSABLE) ×6
LENS IOL TECNIS 19.5 (Intraocular Lens) ×3 IMPLANT
LENS IOL TECNIS MONO 1P 19.5 (Intraocular Lens) ×1 IMPLANT
PACK CATARACT (MISCELLANEOUS) ×3 IMPLANT
PACK CATARACT BRASINGTON LX (MISCELLANEOUS) ×3 IMPLANT
PACK EYE AFTER SURG (MISCELLANEOUS) ×3 IMPLANT
PACK VITRECTOMY CASSETTE 23GA (MISCELLANEOUS) IMPLANT
PACK VITRECTOMY CASSETTE 25GA (MISCELLANEOUS) IMPLANT
SOL BSS BAG (MISCELLANEOUS) ×3
SOL PREP PVP 2OZ (MISCELLANEOUS) ×3
SOLUTION BSS BAG (MISCELLANEOUS) ×1 IMPLANT
SOLUTION PREP PVP 2OZ (MISCELLANEOUS) ×1 IMPLANT
SYR 5ML LL (SYRINGE) ×3 IMPLANT
SYR TB 1ML 27GX1/2 LL (SYRINGE) ×3 IMPLANT
WATER STERILE IRR 1000ML POUR (IV SOLUTION) ×3 IMPLANT
WIPE NON LINTING 3.25X3.25 (MISCELLANEOUS) ×3 IMPLANT

## 2015-04-08 NOTE — Transfer of Care (Signed)
Immediate Anesthesia Transfer of Care Note  Patient: [name]  Procedure(s) Performed: Procedure(s) with comments: CATARACT EXTRACTION PHACO AND INTRAOCULAR LENS PLACEMENT (IOC) (Left) - [place] 00:36 AP% 23.7 CDE 8.72 fluid pack [id number] H  Patient Location: PACU  Anesthesia Type:MAC  Level of Consciousness: awake, alert  and oriented  Airway & Oxygen Therapy: Patient Spontanous Breathing and Patient connected to nasal cannula oxygen  Post-op Assessment: Report given to RN and Post -op Vital signs reviewed and stable  Post vital signs: stable  Last Vitals:  Filed Vitals:   04/08/15 0947  BP: 153/87  Pulse: 84  Temp: 35.9 C  Resp: 14    Complications: No apparent anesthesia complications

## 2015-04-08 NOTE — Op Note (Signed)
PREOPERATIVE DIAGNOSIS:  Nuclear sclerotic cataract of the left eye.   POSTOPERATIVE DIAGNOSIS:  nuclear slerotic cataract left eye   OPERATIVE PROCEDURE:  Procedure(s): CATARACT EXTRACTION PHACO AND INTRAOCULAR LENS PLACEMENT (IOC)   SURGEON:  Birder Robson, MD.   ANESTHESIA:   Anesthesiologist: Molli Barrows, MD CRNA: Aline Brochure, CRNA  1.      Managed anesthesia care. 2.      Topical tetracaine drops followed by 2% Xylocaine jelly applied in the preoperative holding area.   COMPLICATIONS:  None.   TECHNIQUE:   Stop and chop   DESCRIPTION OF PROCEDURE:  The patient was examined and consented in the preoperative holding area where the aforementioned topical anesthesia was applied to the left eye and then brought back to the Operating Room where the left eye was prepped and draped in the usual sterile ophthalmic fashion and a lid speculum was placed. A paracentesis was created with the side port blade and the anterior chamber was filled with viscoelastic. A near clear corneal incision was performed with the steel keratome. A continuous curvilinear capsulorrhexis was performed with a cystotome followed by the capsulorrhexis forceps. Hydrodissection and hydrodelineation were carried out with BSS on a blunt cannula. The lens was removed in a stop and chop  technique and the remaining cortical material was removed with the irrigation-aspiration handpiece. The capsular bag was inflated with viscoelastic and the Technis ZCB00 lens was placed in the capsular bag without complication. The remaining viscoelastic was removed from the eye with the irrigation-aspiration handpiece. The wounds were hydrated. The anterior chamber was flushed with Miostat and the eye was inflated to physiologic pressure. 0.1 mL of cefuroxime concentration 10 mg/mL was placed in the anterior chamber. The wounds were found to be water tight. The eye was dressed with Vigamox. The patient was given protective glasses to  wear throughout the day and a shield with which to sleep tonight. The patient was also given drops with which to begin a drop regimen today and will follow-up with me in one day.  Implant Name Type Inv. Item Serial No. Manufacturer Lot No. LRB No. Used  LENS IMPL INTRAOC [id number] 19.5 - [id number] Intraocular Lens LENS IMPL INTRAOC ZCB00 19.5 [id number]   Left 1   Procedure(s) with comments: CATARACT EXTRACTION PHACO AND INTRAOCULAR LENS PLACEMENT (IOC) (Left) - [place] 00:36 AP% 23.7 CDE 8.72 fluid pack [id number] H  Electronically signed: Janice Seales LOUIS 04/08/2015 9:45 AM

## 2015-04-08 NOTE — Anesthesia Preprocedure Evaluation (Signed)
Anesthesia Evaluation  Patient identified by MRN, date of birth, ID band Patient awake    Reviewed: Allergy & Precautions, H&P , NPO status , Patient's Chart, lab work & pertinent test results, reviewed documented beta blocker date and time   Airway Mallampati: III  TM Distance: >3 FB Neck ROM: full    Dental no notable dental hx. (+) Teeth Intact   Pulmonary neg pulmonary ROS, shortness of breath and at rest, asthma , sleep apnea , resolved, COPD COPD inhaler, former smoker,  Short of breath this am. Will provide duoneb.Will proceed if breathing is comfortable.  breath sounds clear to auscultation        Cardiovascular Exercise Tolerance: Good hypertension, negative cardio ROS  Rhythm:regular Rate:Normal     Neuro/Psych PSYCHIATRIC DISORDERS  Neuromuscular disease CVA negative neurological ROS  negative psych ROS   GI/Hepatic negative GI ROS, Neg liver ROS, GERD-  ,  Endo/Other  negative endocrine ROSdiabetesHypothyroidism Morbid obesity  Renal/GU Renal disease     Musculoskeletal   Abdominal   Peds  Hematology negative hematology ROS (+)   Anesthesia Other Findings   Reproductive/Obstetrics negative OB ROS                             Anesthesia Physical Anesthesia Plan  ASA: IV  Anesthesia Plan: MAC   Post-op Pain Management:    Induction:   Airway Management Planned:   Additional Equipment:   Intra-op Plan:   Post-operative Plan:   Informed Consent: I have reviewed the patients History and Physical, chart, labs and discussed the procedure including the risks, benefits and alternatives for the proposed anesthesia with the patient or authorized representative who has indicated his/her understanding and acceptance.     Plan Discussed with: CRNA  Anesthesia Plan Comments:         Anesthesia Quick Evaluation

## 2015-04-08 NOTE — Discharge Instructions (Addendum)
See cataract post op handout  Eye Surgery Discharge Instructions  Expect mild scratchy sensation or mild soreness. DO NOT RUB YOUR EYE!  The day of surgery:  Minimal physical activity, but bed rest is not required  No reading, computer work, or close hand work  No bending, lifting, or straining.  May watch TV  For 24 hours:  No driving, legal decisions, or alcoholic beverages  Safety precautions  Eat anything you prefer: It is better to start with liquids, then soup then solid foods.  _____ Eye patch should be worn until postoperative exam tomorrow.  ____ Solar shield eyeglasses should be worn for comfort in the sunlight/patch while sleeping  Resume all regular medications including aspirin or Coumadin if these were discontinued prior to surgery. You may shower, bathe, shave, or wash your hair. Tylenol may be taken for mild discomfort.  Call your doctor if you experience significant pain, nausea, or vomiting, fever > 101 or other signs of infection. [phone] or [phone] Specific instructions:  Follow-up Information    Follow up with Tim Lair, MD On 04/09/2015.   Specialty:  Ophthalmology   Why:  8:40   Contact information:   [address] [place] [address] [phone]      Eye Surgery Discharge Instructions  Expect mild scratchy sensation or mild soreness. DO NOT RUB YOUR EYE!  The day of surgery:  Minimal physical activity, but bed rest is not required  No reading, computer work, or close hand work  No bending, lifting, or straining.  May watch TV  For 24 hours:  No driving, legal decisions, or alcoholic beverages  Safety precautions  Eat anything you prefer: It is better to start with liquids, then soup then solid foods.  _____ Eye patch should be worn until postoperative exam tomorrow.  ____ Solar shield eyeglasses should be worn for comfort in the sunlight/patch while sleeping  Resume all regular medications  including aspirin or Coumadin if these were discontinued prior to surgery. You may shower, bathe, shave, or wash your hair. Tylenol may be taken for mild discomfort.  Call your doctor if you experience significant pain, nausea, or vomiting, fever > 101 or other signs of infection. [phone] or [phone] Specific instructions:  Follow-up Information    Follow up with Tim Lair, MD On 04/09/2015.   Specialty:  Ophthalmology   Why:  8:40   Contact information:   [address] [place] [address] [phone]

## 2015-04-08 NOTE — H&P (Signed)
  All labs reviewed. Abnormal studies sent to patients PCP when indicated.  Previous H&P reviewed, patient examined, there are NO CHANGES.  [name] Esperanza LOUIS6/28/20168:43 AM

## 2015-04-08 NOTE — Anesthesia Postprocedure Evaluation (Signed)
  Anesthesia Post-op Note  Patient: [name]  Procedure(s) Performed: Procedure(s) with comments: CATARACT EXTRACTION PHACO AND INTRAOCULAR LENS PLACEMENT (IOC) (Left) - [place] 00:36 AP% 23.7 CDE 8.72 fluid pack [id number] H  Anesthesia type:MAC  Patient location: PACU  Post pain: Pain level controlled  Post assessment: Post-op Vital signs reviewed, Patient's Cardiovascular Status Stable, Respiratory Function Stable, Patent Airway and No signs of Nausea or vomiting  Post vital signs: Reviewed and stable  Last Vitals:  Filed Vitals:   04/08/15 0947  BP: 153/87  Pulse: 84  Temp: 35.9 C  Resp: 14    Level of consciousness: awake, alert  and patient cooperative  Complications: No apparent anesthesia complications

## 2015-04-16 ENCOUNTER — Other Ambulatory Visit: Payer: Self-pay | Admitting: Surgery

## 2015-04-18 ENCOUNTER — Telehealth: Payer: Self-pay | Admitting: *Deleted

## 2015-04-18 NOTE — Telephone Encounter (Signed)
Received office note from Dr. Ninfa Linden requesting cardiology opinion on pt having exploratory laparotomy with bowel resection.  I placed call to pt's daughter to see how pt was doing from a cardiology standpoint.  She reports pt has fluid build up which started 3 days ago. Legs are swollen, red and warm to the touch.  Pt has not increased sodium intake but has been drinking more fluid.  Has been drinking some alcohol but none in last couple of days.  Does not weigh daily as is too unsteady to get accurate weight at home.  Was 258 lbs when he saw Dr. Ninfa Linden on 7/6. Pt is short of breath with exertion. Abdomen is swollen.  Daughter has been trying to get pt to go to ED but he refuses to go.   He is taking furosemide 160 mg in the AM and 80 in the PM and metolazone 5 mg on Monday, Wednesday and Friday.   Will forward to Dr. Angelena Form to see if any med adjustments can be made over the phone.

## 2015-04-18 NOTE — Telephone Encounter (Signed)
He can try taking 160 mg Lasix BID over the weekend with a metolazone today and see if this helps. If he does not have any improvement then he may need to consider admission. cdm

## 2015-04-18 NOTE — Telephone Encounter (Signed)
Spoke with pt's daughter and gave her instructions from Dr. Angelena Form. She reports pt is feeling a little better now but has agreed to go to ED if his symptoms worsen.

## 2015-04-23 ENCOUNTER — Other Ambulatory Visit: Payer: Self-pay | Admitting: Cardiovascular Disease

## 2015-04-29 ENCOUNTER — Ambulatory Visit: Admission: RE | Admit: 2015-04-29 | Payer: Medicare Other | Source: Ambulatory Visit | Admitting: Ophthalmology

## 2015-04-29 ENCOUNTER — Encounter: Admission: RE | Payer: Self-pay | Source: Ambulatory Visit

## 2015-04-29 ENCOUNTER — Other Ambulatory Visit: Payer: Self-pay | Admitting: Cardiovascular Disease

## 2015-04-29 SURGERY — PHACOEMULSIFICATION, CATARACT, WITH IOL INSERTION
Anesthesia: Choice | Laterality: Right

## 2015-05-07 ENCOUNTER — Telehealth: Payer: Self-pay | Admitting: Cardiovascular Disease

## 2015-05-07 NOTE — Telephone Encounter (Signed)
Needs Surgical Clearance for Dr. Ninfa Linden to preform Exploratory lap with possible bowel resection  Has fistula at umbilicus worsening with skin excoriation

## 2015-05-07 NOTE — Telephone Encounter (Signed)
New message      Request for surgical clearance:  1. What type of surgery is being performed?  Explore small bowel  2. When is this surgery scheduled? Pending clearance  3. Are there any medications that need to be held prior to surgery and how long?  Hold aspirin and need medical clearance  4. Name of physician performing surgery? Dr Ninfa Linden  5. What is your office phone and fax number?  Fax [phone]

## 2015-05-08 ENCOUNTER — Encounter: Payer: Self-pay | Admitting: Cardiovascular Disease

## 2015-05-08 NOTE — Telephone Encounter (Signed)
Can you check and see how he is doing? If he is not doing well, needs an office visit. cdm

## 2015-05-08 NOTE — Telephone Encounter (Signed)
Pt's daughter notified and clearance letter faxed to [facility].

## 2015-05-08 NOTE — Telephone Encounter (Signed)
Spoke with pt's daughter. She reports pt's swelling has improved. Not completely gone but back to baseline for pt.  Daughter has been giving him extra dose of lasix as needed every few days. She reports fistula is draining a lot and she has to change the bandage frequently.  Will forward to Dr. Angelena Form to see if office visit needed or pt can be cleared without visit.

## 2015-05-08 NOTE — Telephone Encounter (Signed)
Letter in pts chart. cdm

## 2015-05-16 ENCOUNTER — Other Ambulatory Visit: Payer: Self-pay | Admitting: Surgery

## 2015-05-26 ENCOUNTER — Encounter (HOSPITAL_COMMUNITY): Payer: Self-pay | Admitting: Family Medicine

## 2015-05-26 ENCOUNTER — Observation Stay (HOSPITAL_COMMUNITY)
Admission: EM | Admit: 2015-05-26 | Discharge: 2015-05-29 | Disposition: A | Discharge status: Home / Self Care | Payer: Medicare Other | Attending: Internal Medicine | Admitting: Internal Medicine

## 2015-05-26 ENCOUNTER — Emergency Department (HOSPITAL_COMMUNITY): Payer: Medicare Other

## 2015-05-26 DIAGNOSIS — E1140 Type 2 diabetes mellitus with diabetic neuropathy, unspecified: Secondary | ICD-10-CM | POA: Diagnosis not present

## 2015-05-26 DIAGNOSIS — K632 Fistula of intestine: Secondary | ICD-10-CM | POA: Diagnosis not present

## 2015-05-26 DIAGNOSIS — K219 Gastro-esophageal reflux disease without esophagitis: Secondary | ICD-10-CM | POA: Insufficient documentation

## 2015-05-26 DIAGNOSIS — Z853 Personal history of malignant neoplasm of breast: Secondary | ICD-10-CM | POA: Diagnosis not present

## 2015-05-26 DIAGNOSIS — Z87891 Personal history of nicotine dependence: Secondary | ICD-10-CM | POA: Diagnosis not present

## 2015-05-26 DIAGNOSIS — M109 Gout, unspecified: Secondary | ICD-10-CM | POA: Insufficient documentation

## 2015-05-26 DIAGNOSIS — I481 Persistent atrial fibrillation: Secondary | ICD-10-CM | POA: Insufficient documentation

## 2015-05-26 DIAGNOSIS — F329 Major depressive disorder, single episode, unspecified: Secondary | ICD-10-CM | POA: Diagnosis not present

## 2015-05-26 DIAGNOSIS — Z79891 Long term (current) use of opiate analgesic: Secondary | ICD-10-CM | POA: Diagnosis not present

## 2015-05-26 DIAGNOSIS — E039 Hypothyroidism, unspecified: Secondary | ICD-10-CM | POA: Insufficient documentation

## 2015-05-26 DIAGNOSIS — Z7982 Long term (current) use of aspirin: Secondary | ICD-10-CM | POA: Insufficient documentation

## 2015-05-26 DIAGNOSIS — F1010 Alcohol abuse, uncomplicated: Secondary | ICD-10-CM | POA: Diagnosis not present

## 2015-05-26 DIAGNOSIS — E785 Hyperlipidemia, unspecified: Secondary | ICD-10-CM | POA: Insufficient documentation

## 2015-05-26 DIAGNOSIS — Z79899 Other long term (current) drug therapy: Secondary | ICD-10-CM | POA: Insufficient documentation

## 2015-05-26 DIAGNOSIS — R350 Frequency of micturition: Secondary | ICD-10-CM | POA: Insufficient documentation

## 2015-05-26 DIAGNOSIS — Z8673 Personal history of transient ischemic attack (TIA), and cerebral infarction without residual deficits: Secondary | ICD-10-CM | POA: Insufficient documentation

## 2015-05-26 DIAGNOSIS — N183 Chronic kidney disease, stage 3 (moderate): Secondary | ICD-10-CM | POA: Insufficient documentation

## 2015-05-26 DIAGNOSIS — I2510 Atherosclerotic heart disease of native coronary artery without angina pectoris: Secondary | ICD-10-CM | POA: Diagnosis not present

## 2015-05-26 DIAGNOSIS — M79605 Pain in left leg: Secondary | ICD-10-CM | POA: Diagnosis present

## 2015-05-26 DIAGNOSIS — I6782 Cerebral ischemia: Secondary | ICD-10-CM | POA: Insufficient documentation

## 2015-05-26 DIAGNOSIS — M25562 Pain in left knee: Secondary | ICD-10-CM | POA: Diagnosis not present

## 2015-05-26 DIAGNOSIS — Z794 Long term (current) use of insulin: Secondary | ICD-10-CM | POA: Diagnosis not present

## 2015-05-26 DIAGNOSIS — E876 Hypokalemia: Secondary | ICD-10-CM | POA: Diagnosis not present

## 2015-05-26 DIAGNOSIS — I130 Hypertensive heart and chronic kidney disease with heart failure and stage 1 through stage 4 chronic kidney disease, or unspecified chronic kidney disease: Secondary | ICD-10-CM | POA: Insufficient documentation

## 2015-05-26 DIAGNOSIS — Z7951 Long term (current) use of inhaled steroids: Secondary | ICD-10-CM | POA: Insufficient documentation

## 2015-05-26 DIAGNOSIS — R509 Fever, unspecified: Secondary | ICD-10-CM | POA: Diagnosis not present

## 2015-05-26 DIAGNOSIS — Z9981 Dependence on supplemental oxygen: Secondary | ICD-10-CM | POA: Insufficient documentation

## 2015-05-26 DIAGNOSIS — J449 Chronic obstructive pulmonary disease, unspecified: Secondary | ICD-10-CM | POA: Diagnosis not present

## 2015-05-26 DIAGNOSIS — Z9011 Acquired absence of right breast and nipple: Secondary | ICD-10-CM | POA: Insufficient documentation

## 2015-05-26 DIAGNOSIS — Z6835 Body mass index (BMI) 35.0-35.9, adult: Secondary | ICD-10-CM | POA: Diagnosis not present

## 2015-05-26 DIAGNOSIS — E119 Type 2 diabetes mellitus without complications: Secondary | ICD-10-CM

## 2015-05-26 DIAGNOSIS — Z951 Presence of aortocoronary bypass graft: Secondary | ICD-10-CM | POA: Insufficient documentation

## 2015-05-26 DIAGNOSIS — W19XXXA Unspecified fall, initial encounter: Secondary | ICD-10-CM | POA: Insufficient documentation

## 2015-05-26 DIAGNOSIS — E1122 Type 2 diabetes mellitus with diabetic chronic kidney disease: Secondary | ICD-10-CM | POA: Insufficient documentation

## 2015-05-26 DIAGNOSIS — R531 Weakness: Secondary | ICD-10-CM | POA: Diagnosis present

## 2015-05-26 DIAGNOSIS — C50929 Malignant neoplasm of unspecified site of unspecified male breast: Secondary | ICD-10-CM | POA: Diagnosis not present

## 2015-05-26 DIAGNOSIS — I5032 Chronic diastolic (congestive) heart failure: Secondary | ICD-10-CM | POA: Insufficient documentation

## 2015-05-26 DIAGNOSIS — G894 Chronic pain syndrome: Secondary | ICD-10-CM | POA: Diagnosis not present

## 2015-05-26 DIAGNOSIS — R5381 Other malaise: Secondary | ICD-10-CM | POA: Diagnosis not present

## 2015-05-26 DIAGNOSIS — G4733 Obstructive sleep apnea (adult) (pediatric): Secondary | ICD-10-CM | POA: Diagnosis not present

## 2015-05-26 LAB — URINALYSIS, ROUTINE W REFLEX MICROSCOPIC
Bilirubin Urine: NEGATIVE
Glucose, UA: NEGATIVE mg/dL
Ketones, ur: NEGATIVE mg/dL
Leukocytes, UA: NEGATIVE
Nitrite: NEGATIVE
Protein, ur: >300 mg/dL — AB
Specific Gravity, Urine: 1.011 (ref 1.005–1.030)
Urobilinogen, UA: 0.2 mg/dL (ref 0.0–1.0)
pH: 6 (ref 5.0–8.0)

## 2015-05-26 LAB — BASIC METABOLIC PANEL WITH GFR
Anion gap: 15 (ref 5–15)
BUN: 56 mg/dL — ABNORMAL HIGH (ref 6–20)
CO2: 30 mmol/L (ref 22–32)
Calcium: 8.3 mg/dL — ABNORMAL LOW (ref 8.9–10.3)
Chloride: 89 mmol/L — ABNORMAL LOW (ref 101–111)
Creatinine, Ser: 1.26 mg/dL — ABNORMAL HIGH (ref 0.61–1.24)
GFR calc Af Amer: >60 mL/min
GFR calc non Af Amer: 56 mL/min — ABNORMAL LOW
Glucose, Bld: 176 mg/dL — ABNORMAL HIGH (ref 65–99)
Potassium: 2.7 mmol/L — CL (ref 3.5–5.1)
Sodium: 134 mmol/L — ABNORMAL LOW (ref 135–145)

## 2015-05-26 LAB — URINE MICROSCOPIC-ADD ON

## 2015-05-26 LAB — CBC
HCT: 41.2 % (ref 39.0–52.0)
Hemoglobin: 13.5 g/dL (ref 13.0–17.0)
MCH: 31.7 pg (ref 26.0–34.0)
MCHC: 32.8 g/dL (ref 30.0–36.0)
MCV: 96.7 fL (ref 78.0–100.0)
Platelets: 148 10*3/uL — ABNORMAL LOW (ref 150–400)
RBC: 4.26 MIL/uL (ref 4.22–5.81)
RDW: 15.3 % (ref 11.5–15.5)
WBC: 12.4 10*3/uL — ABNORMAL HIGH (ref 4.0–10.5)

## 2015-05-26 LAB — I-STAT TROPONIN, ED: TROPONIN I, POC: 0.03 ng/mL (ref 0.00–0.08)

## 2015-05-26 LAB — CBG MONITORING, ED: Glucose-Capillary: 183 mg/dL — ABNORMAL HIGH (ref 65–99)

## 2015-05-26 LAB — MAGNESIUM: Magnesium: 0.9 mg/dL — CL (ref 1.7–2.4)

## 2015-05-26 MED ORDER — POTASSIUM CHLORIDE 10 MEQ/100ML IV SOLN
10.0000 meq | Freq: Once | INTRAVENOUS | Status: AC
  Administered 2015-05-26: 10 meq via INTRAVENOUS
  Filled 2015-05-26: qty 100

## 2015-05-26 MED ORDER — MAGNESIUM SULFATE 2 GM/50ML IV SOLN
2.0000 g | Freq: Once | INTRAVENOUS | Status: AC
  Administered 2015-05-26: 2 g via INTRAVENOUS
  Filled 2015-05-26: qty 50

## 2015-05-26 MED ORDER — OXYCODONE-ACETAMINOPHEN 5-325 MG PO TABS
ORAL_TABLET | ORAL | Status: AC
  Filled 2015-05-26: qty 1

## 2015-05-26 MED ORDER — POTASSIUM CHLORIDE CRYS ER 20 MEQ PO TBCR
40.0000 meq | EXTENDED_RELEASE_TABLET | Freq: Once | ORAL | Status: AC
  Administered 2015-05-26: 40 meq via ORAL
  Filled 2015-05-26: qty 2

## 2015-05-26 MED ORDER — OXYCODONE-ACETAMINOPHEN 5-325 MG PO TABS
1.0000 | ORAL_TABLET | Freq: Once | ORAL | Status: AC
  Administered 2015-05-26: 1 via ORAL

## 2015-05-26 NOTE — ED Notes (Signed)
Pt given food and diet soda by EMT Kaitlin. CBG 174

## 2015-05-26 NOTE — ED Provider Notes (Signed)
MSE was initiated and I personally evaluated the patient and placed orders (if any) at  4:34 PM on May 26, 2015.  The patient appears stable so that the remainder of the MSE may be completed by another provider.  Ripley Fraise, MD 05/26/15 [phone]

## 2015-05-26 NOTE — ED Notes (Signed)
Pt states he is unable to provide a urine specimen.

## 2015-05-26 NOTE — ED Notes (Signed)
EDP at bedside  

## 2015-05-26 NOTE — ED Notes (Signed)
Critical lab potassium 2.7  Per Renelda Loma lab tech

## 2015-05-26 NOTE — ED Notes (Signed)
MR Michalik's daughter is Darrold Junker - call at [phone]

## 2015-05-26 NOTE — ED Notes (Addendum)
Pt here for weakness, fever and urinary frequency with foul odor. Sts also some left knee pain and swelling/

## 2015-05-26 NOTE — ED Notes (Signed)
Daughter called for update. Spoke with PA Barrett about pt getting admitted

## 2015-05-26 NOTE — ED Provider Notes (Signed)
Medical screening examination/treatment/procedure(s) were conducted as a shared visit with non-physician practitioner(s) and myself.  I personally evaluated the patient during the encounter.   EKG Interpretation None     69 year old male presenting with generalized weakness. Appears to be chronically ill on exam with no acute distress. Hypokalemia and hypomagnesemia noted on lab screening, no infectious findings, admit for further workup and management.  See related encounter note   Leo Grosser, MD 05/26/15 2320

## 2015-05-26 NOTE — ED Notes (Signed)
Pt to xray

## 2015-05-26 NOTE — ED Notes (Signed)
Abd dressing changed. Old dressing was saturated. Clean gauze applied, abd pad applied, tape applied.

## 2015-05-26 NOTE — H&P (Signed)
[facility] History and Physical  [name] LEANDRO GLO:[id number] DOB: November 22, 1945 DOA: 05/26/2015  Referring physician: ER MD [facility] PCP: Jene Every, MD   Chief Complaint: Generalized weakness  HPI: [name] is a 69 y.o. male with hx of DM/ CAD/ CABG, DM2, HTN, gout, HL, etoh abuse, afib.  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  In Oct '15 had gait dysfunction and cerebellar CVA. Hx etoh abuse. In Feb '16 had R mastectomy for male breast cancer. Presenting today with generalized weakness to ED.  Work up showed hypokalemia and low Mg.  Asked to see for hospital admission.    Patient denies any fever, chills, sweats, CP, SOB, cough.  No abd pain, n/v/d, no dysuria, hematuria, no joint pains or rash. Chronic LE redness, no worse than usual.  Says they are going to fix his abd wound fistula next week with surgery.   Home meds include lasix, 160 in am and 80 qpm; metolazone 5 mg MWF, coreg 3.125 bid, neurontin, lantus, PPI, others  Chart review: '01 - acute resp failure/ OSA/ COPD, also, DM2, low K '01 - CABG x 4, DM , HTN , gout, HL '02 - L CEA '10 - cellulitis LLE/ burn RLE, hx etoh/ tob abuse '11 - SOB multifactorial, afib, DM '11 - LLE cellulitis, SOB d/t CHF, afib, a/c renal failure, DM2, OSA, HL, gout, depression  '12 - AMS/ SOB, etoh abuse, NSTEMI /+trop > medical Rx, treated for CAP also, dc'd to snf '13 - acute/ chronic diast CHF, severe pulm HTN, pseudomonas UTI, AMS resolving, afib 3/14 - partial SBO, rx'd conservatively 7/14 - ac diast HF, R HF w anasarca, CAF no AC due to etoh/ falls, hx CABG/ DM/AKI / CRS 4/15 - SBO/ umb hernia > underwent ex lap with SB resection and hernia repair. AKI peak creat 1.9, 1.3 at dc.  7/15 - A/C chronic systolic/ diastolic heart failure ; acute/ chronic renal failure > had dialysis then renal fxn improved, creat down to 1.6 at dc. Dc on lasix/ aldactone/ metolazone. CAF 10/15 - SOB unclear cause, on  home O2. ETOH abuse. Recently dx'd with breast cancer. 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 Neuro rec'd ASA, not candidate for anticoag due to etoh abuse/ fall risk.  11/15 - drainage from abd wound related to 4/15 hernia repair/ SB resection. Dx was EC fistula, seen by surgery , rec'd dressing changes. Abx stopped.  12/15 - a/c diast CHF, hypoK, LLE cellulitis, R breast cancer, DM, HHD, PAF 2/16 - R mastectomy  Where does patient live at home w his daughter Can patient participate in ADLs? Walks w walker on his own, feeds self, needs help getting dressed sometimes  Past Medical History  Past Medical History  Diagnosis Date  . CHRONIC OBSTRUCTIVE PULMONARY DISEASE 06/20/2009  . CAROTID STENOSIS 06/20/2009    A. 08/2001 s/p L CEA;  B.   09/14/11 - Carotid U/S - 40-59% bilateral stenosis, left CEA patch angioplasty is patent  . DM 06/20/2009  . CAD 06/20/2009    A.  08/2000 - s/p CABG x 4 - LIMA-LAD, Left Radial-OM, VG-DIAG, VG-RCA;  B. Neg. MV  2010  . HYPERLIPIDEMIA 06/20/2009  . HYPERTENSION 06/20/2009  . Hypothyroidism   . Low back pain   . Asthma     as child  . Pneumonia   . Persistent atrial fibrillation     Not felt to be coumadin candidate 2/2 ETOH  use.  . History of tobacco abuse     remote - quit 1970  . Morbidly obese   . Chronic diastolic heart failure, NYHA class 3     Followed by [facility]F Clinic --> Echo 04/2014: EF 32-95%, Diastolic DysFxn - elevated LVEDP & LAP. Mod Dil LA & RA.  . Falls frequently   . Hx of cardiovascular stress test 05/2014    Lexiscan Myoview (8/15):  No ischemia; EF 63% - Normal Study  . Breast cancer, right breast   . Gout   . OBSTRUCTIVE SLEEP APNEA 06/20/2009    does not use cpap  . Stroke     found on MRI in Oct. 2015.   Marland Kitchen Shortness of breath dyspnea     occasional  . Depression     takes Prozac  . GERD (gastroesophageal reflux disease)     takes Prilosec  . Arthritis   . Neuromuscular disorder      neuropathy in both legs  . Constipation   . ETOH abuse     no alcohol since Oct. 2015  . 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 Bilateral renal cysts   . Kidney disease     Stage 3  . CHF (congestive heart failure)   . Supplemental oxygen dependent     hs   Past Surgical History  Past Surgical History  Procedure Laterality Date  . Carotid endarterectomy  2002    left  . Coronary artery bypass graft      x 4 - 2001  . Umbilical hernia repair N/A 01/28/2014    Procedure: HERNIA REPAIR UMBILICAL ADULT/INC;  Surgeon: Harl Bowie, MD;  Location: [place];  Service: General;  Laterality: N/A;  . Laparotomy N/A 01/28/2014    Procedure: EXPLORATORY LAPAROTOMY;  Surgeon: Harl Bowie, MD;  Location: [place];  Service: General;  Laterality: N/A;  . Bowel resection N/A 01/28/2014    Procedure: SMALL BOWEL RESECTION;  Surgeon: Harl Bowie, MD;  Location: [place];  Service: General;  Laterality: N/A;  . Hernia repair    . Colonoscopy    . Total mastectomy Right 11/11/2014    Procedure: RIGHT TOTAL MASTECTOMY;  Surgeon: Excell Seltzer, MD;  Location: [place];  Service: General;  Laterality: Right;  . Cataract extraction w/phaco Left 04/08/2015    Procedure: CATARACT EXTRACTION PHACO AND INTRAOCULAR LENS PLACEMENT ([place]);  Surgeon: Birder Robson, MD;  Location: [facility];  Service: Ophthalmology;  Laterality: Left;  [place] 00:36 AP% 23.7 CDE 8.72 fluid pack [id number] H   Family History  Family History  Problem Relation Age of Onset  . Stroke Mother     ?  Marland Kitchen Cancer Mother     unknown type of cancer  . Stroke Father     ?  Marland Kitchen Heart attack Neg Hx   . Cancer Brother 1    stomach cancer   Social History  reports that he quit smoking about 46 years ago. He has never used smokeless tobacco. He reports that he drinks alcohol. He reports that he uses illicit drugs (Marijuana). Allergies  Allergies  Allergen Reactions  .  Erythromycin Hives   Home medications Prior to Admission medications   Medication Sig Start Date End Date Taking? Authorizing Provider  allopurinol (ZYLOPRIM) 300 MG tablet Take 1 tablet (300 mg total) by mouth daily. 08/02/14   Lavon Paganini Angiulli, PA-C  aspirin EC 325 MG EC tablet Take 1 tablet (  325 mg total) by mouth daily. 08/02/14   Lavon Paganini Angiulli, PA-C  atorvastatin (LIPITOR) 20 MG tablet Take 1 tablet (20 mg total) by mouth daily. 08/02/14   Lavon Paganini Angiulli, PA-C  carvedilol (COREG) 3.125 MG tablet Take 1 tablet (3.125 mg total) by mouth 2 (two) times daily with a meal. 08/02/14   Lavon Paganini Angiulli, PA-C  colchicine 0.6 MG tablet Take 0.6 mg by mouth daily. 01/13/15   Historical Provider, MD  FLUoxetine (PROZAC) 20 MG capsule Take 1 capsule (20 mg total) by mouth daily. 08/02/14   Lavon Paganini Angiulli, PA-C  fluticasone (FLOVENT HFA) 110 MCG/ACT inhaler Inhale 2 puffs into the lungs 2 (two) times daily.    Historical Provider, MD  folic acid (FOLVITE) 1 MG tablet Take 1 tablet (1 mg total) by mouth daily. 08/02/14   Lavon Paganini Angiulli, PA-C  furosemide (LASIX) 80 MG tablet TAKE 2 TABLETS IN THE AM AND 1 IN THE PM 04/09/15   Burnell Blanks, MD  gabapentin (NEURONTIN) 300 MG capsule Take 1 capsule (300 mg total) by mouth 2 (two) times daily. 08/02/14   Lavon Paganini Angiulli, PA-C  insulin glargine (LANTUS) 100 UNIT/ML injection Inject 14 Units into the skin at bedtime.    Historical Provider, MD  KLOR-CON M20 20 MEQ tablet TAKE 3 TABLETS (60 MEQ TOTAL) BY MOUTH DAILY. 04/24/15   Burnell Blanks, MD  levothyroxine (SYNTHROID, LEVOTHROID) 125 MCG tablet Take 1.5 tablets (187 mcg total) by mouth daily before breakfast. 08/02/14   Lavon Paganini Angiulli, PA-C  metolazone (ZAROXOLYN) 5 MG tablet Take 5 mg by mouth every Monday, Wednesday, and Friday. 01/24/15   Historical Provider, MD  Multiple Vitamin (MULTIVITAMIN WITH MINERALS) TABS tablet Take 1 tablet by mouth daily.    Historical Provider,  MD  omeprazole (PRILOSEC) 20 MG capsule Take 1 capsule (20 mg total) by mouth daily. 08/02/14   Lavon Paganini Angiulli, PA-C  Oxycodone HCl 10 MG TABS Take 1 tablet (10 mg total) by mouth every 6 (six) hours as needed. 11/12/14   Excell Seltzer, MD  tamoxifen (NOLVADEX) 20 MG tablet Take 1 tablet (20 mg total) by mouth daily. 09/09/14   Nicholas Lose, MD  venlafaxine XR (EFFEXOR-XR) 37.5 MG 24 hr capsule TAKE ONE CAPSULE BY MOUTH DAILY 02/24/15   Nicholas Lose, MD   Liver Function Tests No results for input(s): AST, ALT, ALKPHOS, BILITOT, PROT, ALBUMIN in the last 168 hours. No results for input(s): LIPASE, AMYLASE in the last 168 hours. CBC  Recent Labs Lab 05/26/15 1610  WBC 12.4*  HGB 13.5  HCT 41.2  MCV 96.7  PLT 798*   Basic Metabolic Panel  Recent Labs Lab 05/26/15 1610  NA 134*  K 2.7*  CL 89*  CO2 30  GLUCOSE 176*  BUN 56*  CREATININE 1.26*  CALCIUM 8.3*       Filed Vitals:   05/26/15 2215 05/26/15 2230 05/26/15 2245 05/26/15 2300  BP: 145/69 115/59 142/84 148/67  Pulse: 85 84 87 79  Temp:      TempSrc:      Resp: 21 14 20 17   Height:      Weight:      SpO2: 100% 100% 98% 100%   Exam: Chronically-ill appearing WM, no distress No rash, cyanosis or gangrene Sclera anicteric, throat w dry mucous membranes No jvd Chest is clear bilat RRR no MRG R mastectomy, L gynecomastia Abd soft, ntnd no mass no hsm, mid-abdomen open wound w moist dressing in place (  fistula), no odor GU condom cath Ext bilat 1+ pretib edema with mild bilat pretib erythema as well, no open wounds or ulcers No joint effusion/ deformity Pedal pulses diminished Neuro is awake, alert, Ox 3    Na 134  K 2.7  CL 89  CO2 30  BUN 56  Creat 1.26  Ca 8.3 Mg 0.9 WBC 12k  Hb 13  plt 148  Glu 176 UA >300 prot, 0-2 rbc/ wbc  Head CT >  1. No acute intracranial pathology seen on CT. 2. Moderate cortical volume loss and scattered small vessel ischemic microangiopathy. 3. Small chronic infarct  at the high left frontoparietal region. Mild cerebellar atrophy, with chronic infarct at the inferior left cerebellar hemisphere  UA negative CXR: independently reviewed > no acute change from old views, "vasc congestion" appearance, but this looks to be chronic  ECHO 7/15 > LVEF 50-55%, RV mod dilated, LA mod-severely dilated  Assessment: 1. Gen weakness - probably due to hypokalemia (severe) and hypomagnesemia, which are in turn due to high-dose diuretics (lasix/ zaroxlyn). Patient looks dry.  CXR w appearance of "congestion" but these are chronic changes and my be directing the high doses of diuretics.  Plan admit, replace KCL and Mg, hold diuretics, give NS at 50 cc/ hr.  2. ETOH abuse - many many years 3. HTN cont coreg 4. CAD hx CABG - last EF 50-55% 5. Chronic pain on oxycodone 6. EC fistula - for repair next week 7. Hx cerebellar CVA 8. Chronic debility - mild/mod 9. DM on insulin 10. R breast Ca s/p mastectomy 11. CKD stage 3 12. EOL - had discussion with patient about code status , he requested DNR. "Let me go in peace", he said.  Plan - as above   DVT Prophylaxis lovenox  Code Status: DNR  Family Communication: no family present Disposition Plan: dc when better    Mayetta Castleman D [facility] Pager [phone]  If 7PM-7AM, please contact night-coverage [email or web address] Password [email or web address] 05/27/2015, 12:00 AM

## 2015-05-26 NOTE — ED Notes (Signed)
Pt back from x-ray.

## 2015-05-26 NOTE — ED Notes (Signed)
Attempted report X1

## 2015-05-26 NOTE — ED Provider Notes (Signed)
CSN: [id number]     Arrival date & time 05/26/15  1539 History   First MD Initiated Contact with Patient 05/26/15 1633     Chief Complaint  Patient presents with  . Weakness  . Urinary Frequency   History is provided by [name] and his daughter  HPI  [name] is a 69 year old man with PMHx of CHF, DM, CKD and umbilical fistula presenting today with generalized weakness and increased urinary frequency. He reports a 2-3 day history of foul smelling urine and dysuria. His daughter says that he appears to be a little bit more confused than normal and has difficulty concentrating. He has had sweats for the past few days. She also reports a fall 2 days ago with knee pain. He reports the fall was due to standing up and feeling "swimmy headed" and falling forward onto his hands. He states that the whole knee hurts and pain increases when he stands or tries to bend his knee. He has a history of gout but denies redness or warmth of the knee. He cannot remember if he hit his head or not. He has a draining umbilical fistula for which he is seeing Dr. Ninfa Linden. He was seen one week ago due to increased drainage and erythema around the wound. He was prescribed doxycycline and reports compliance. Denies headaches, lightheadedness, chest pain, shortness of breath, abdominal pain, nausea, vomiting, diarrhea or muscle aches.  Past Medical History  Diagnosis Date  . CHRONIC OBSTRUCTIVE PULMONARY DISEASE 06/20/2009  . CAROTID STENOSIS 06/20/2009    A. 08/2001 s/p L CEA;  B.   09/14/11 - Carotid U/S - 40-59% bilateral stenosis, left CEA patch angioplasty is patent  . DM 06/20/2009  . CAD 06/20/2009    A.  08/2000 - s/p CABG x 4 - LIMA-LAD, Left Radial-OM, VG-DIAG, VG-RCA;  B. Neg. MV  2010  . HYPERLIPIDEMIA 06/20/2009  . HYPERTENSION 06/20/2009  . Hypothyroidism   . Low back pain   . Asthma     as child  . Pneumonia   . Persistent atrial fibrillation     Not felt to be coumadin candidate 2/2 ETOH use.  Marland Kitchen  History of tobacco abuse     remote - quit 1970  . Morbidly obese   . Chronic diastolic heart failure, NYHA class 3     Followed by [facility] --> Echo 04/2014: EF 72-62%, Diastolic DysFxn - elevated LVEDP & LAP. Mod Dil LA & RA.  . Falls frequently   . Hx of cardiovascular stress test 05/2014    Lexiscan Myoview (8/15):  No ischemia; EF 63% - Normal Study  . Breast cancer, right breast   . Gout   . OBSTRUCTIVE SLEEP APNEA 06/20/2009    does not use cpap  . Stroke     found on MRI in Oct. 2015.   Marland Kitchen Shortness of breath dyspnea     occasional  . Depression     takes Prozac  . GERD (gastroesophageal reflux disease)     takes Prilosec  . Arthritis   . Neuromuscular disorder     neuropathy in both legs  . Constipation   . ETOH abuse     no alcohol since Oct. 2015  . 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 Bilateral renal cysts   . Kidney disease     Stage 3  . CHF (congestive heart failure)   . Supplemental oxygen dependent  hs   Past Surgical History  Procedure Laterality Date  . Carotid endarterectomy  2002    left  . Coronary artery bypass graft      x 4 - 2001  . Umbilical hernia repair N/A 01/28/2014    Procedure: HERNIA REPAIR UMBILICAL ADULT/INC;  Surgeon: Harl Bowie, MD;  Location: [place];  Service: General;  Laterality: N/A;  . Laparotomy N/A 01/28/2014    Procedure: EXPLORATORY LAPAROTOMY;  Surgeon: Harl Bowie, MD;  Location: [place];  Service: General;  Laterality: N/A;  . Bowel resection N/A 01/28/2014    Procedure: SMALL BOWEL RESECTION;  Surgeon: Harl Bowie, MD;  Location: [place];  Service: General;  Laterality: N/A;  . Hernia repair    . Colonoscopy    . Total mastectomy Right 11/11/2014    Procedure: RIGHT TOTAL MASTECTOMY;  Surgeon: Excell Seltzer, MD;  Location: [place];  Service: General;  Laterality: Right;  . Cataract extraction w/phaco Left 04/08/2015    Procedure: CATARACT  EXTRACTION PHACO AND INTRAOCULAR LENS PLACEMENT ([place]);  Surgeon: Birder Robson, MD;  Location: [facility];  Service: Ophthalmology;  Laterality: Left;  [place] 00:36 AP% 23.7 CDE 8.72 fluid pack [id number] H   Family History  Problem Relation Age of Onset  . Stroke Mother     ?  Marland Kitchen Cancer Mother     unknown type of cancer  . Stroke Father     ?  Marland Kitchen Heart attack Neg Hx   . Cancer Brother 1    stomach cancer   Social History  Substance Use Topics  . Smoking status: Former Smoker -- 1.00 packs/day for 16 years    Quit date: 10/11/1968  . Smokeless tobacco: Never Used  . Alcohol Use: Yes     Comment: last drink was 2 weeks ago.    Review of Systems  Constitutional: Positive for diaphoresis and fatigue. Negative for fever.  Respiratory: Negative for chest tightness and shortness of breath.   Cardiovascular: Negative for chest pain and leg swelling.  Gastrointestinal: Negative for nausea, vomiting, abdominal pain and diarrhea.  Genitourinary: Positive for dysuria and frequency. Negative for hematuria and discharge.  Musculoskeletal: Positive for joint swelling, arthralgias and gait problem. Negative for myalgias and back pain.  Skin: Negative for color change.  Neurological: Positive for weakness and light-headedness. Negative for dizziness, syncope and headaches.      Allergies  Erythromycin  Home Medications   Prior to Admission medications   Medication Sig Start Date End Date Taking? Authorizing Provider  allopurinol (ZYLOPRIM) 300 MG tablet Take 1 tablet (300 mg total) by mouth daily. 08/02/14   Lavon Paganini Angiulli, PA-C  aspirin EC 325 MG EC tablet Take 1 tablet (325 mg total) by mouth daily. 08/02/14   Lavon Paganini Angiulli, PA-C  atorvastatin (LIPITOR) 20 MG tablet Take 1 tablet (20 mg total) by mouth daily. 08/02/14   Lavon Paganini Angiulli, PA-C  carvedilol (COREG) 3.125 MG tablet Take 1 tablet (3.125 mg total) by mouth 2 (two) times daily with a meal. 08/02/14   Lavon Paganini  Angiulli, PA-C  colchicine 0.6 MG tablet Take 0.6 mg by mouth daily. 01/13/15   Historical Provider, MD  FLUoxetine (PROZAC) 20 MG capsule Take 1 capsule (20 mg total) by mouth daily. 08/02/14   Lavon Paganini Angiulli, PA-C  fluticasone (FLOVENT HFA) 110 MCG/ACT inhaler Inhale 2 puffs into the lungs 2 (two) times daily.    Historical Provider, MD  folic acid (FOLVITE) 1 MG tablet Take 1 tablet (  1 mg total) by mouth daily. 08/02/14   Lavon Paganini Angiulli, PA-C  furosemide (LASIX) 80 MG tablet TAKE 2 TABLETS IN THE AM AND 1 IN THE PM 04/09/15   Burnell Blanks, MD  gabapentin (NEURONTIN) 300 MG capsule Take 1 capsule (300 mg total) by mouth 2 (two) times daily. 08/02/14   Lavon Paganini Angiulli, PA-C  insulin glargine (LANTUS) 100 UNIT/ML injection Inject 14 Units into the skin at bedtime.    Historical Provider, MD  KLOR-CON M20 20 MEQ tablet TAKE 3 TABLETS (60 MEQ TOTAL) BY MOUTH DAILY. 04/24/15   Burnell Blanks, MD  levothyroxine (SYNTHROID, LEVOTHROID) 125 MCG tablet Take 1.5 tablets (187 mcg total) by mouth daily before breakfast. 08/02/14   Lavon Paganini Angiulli, PA-C  metolazone (ZAROXOLYN) 5 MG tablet Take 5 mg by mouth every Monday, Wednesday, and Friday. 01/24/15   Historical Provider, MD  Multiple Vitamin (MULTIVITAMIN WITH MINERALS) TABS tablet Take 1 tablet by mouth daily.    Historical Provider, MD  omeprazole (PRILOSEC) 20 MG capsule Take 1 capsule (20 mg total) by mouth daily. 08/02/14   Lavon Paganini Angiulli, PA-C  Oxycodone HCl 10 MG TABS Take 1 tablet (10 mg total) by mouth every 6 (six) hours as needed. 11/12/14   Excell Seltzer, MD  tamoxifen (NOLVADEX) 20 MG tablet Take 1 tablet (20 mg total) by mouth daily. 09/09/14   Nicholas Lose, MD  venlafaxine XR (EFFEXOR-XR) 37.5 MG 24 hr capsule TAKE ONE CAPSULE BY MOUTH DAILY 02/24/15   Nicholas Lose, MD   BP 142/84 mmHg  Pulse 87  Temp(Src) 97.5 F (36.4 C) (Oral)  Resp 20  Ht 5\' 7"  (1.702 m)  Wt 245 lb (111.131 kg)  BMI 38.36 kg/m2  SpO2  98% Physical Exam  Constitutional: He is oriented to person, place, and time. He appears well-developed and well-nourished. No distress.  HENT:  Head: Normocephalic and atraumatic.  Cardiovascular: Normal rate and regular rhythm.   Pulmonary/Chest: Breath sounds normal. No respiratory distress.  Abdominal: Soft. He exhibits no distension. There is no tenderness.  Umbilical fistula with moderate amount of drainage. Erythema surrounding fistula.   Musculoskeletal:  TTP over left knee. Small effusion present. Active ROM decreased in left knee 2/2 to pain. Passive ROM intact.  ROM of left hip intact.   Neurological: He is alert and oriented to person, place, and time.  Sensation intact over left leg. 4/5 strength of left knee and hip.   Skin: Skin is warm and dry. He is not diaphoretic.  No erythema over left knee.   Psychiatric: He has a normal mood and affect. His speech is normal.  Easily distracted during interview. When asked questions, would change answers throughout interview. Would answer "I don't know" to some questions about symptoms    ED Course  Procedures (including critical care time) Labs Review Labs Reviewed  URINALYSIS, [place] (NOT AT [facility]) - Abnormal; Notable for the following:    Hgb urine dipstick SMALL (*)    Protein, ur >300 (*)    All other components within normal limits  CBC - Abnormal; Notable for the following:    WBC 12.4 (*)    Platelets 148 (*)    All other components within normal limits  BASIC METABOLIC PANEL - Abnormal; Notable for the following:    Sodium 134 (*)    Potassium 2.7 (*)    Chloride 89 (*)    Glucose, Bld 176 (*)    BUN 56 (*)  Creatinine, Ser 1.26 (*)    Calcium 8.3 (*)    GFR calc non Af Amer 56 (*)    All other components within normal limits  MAGNESIUM - Abnormal; Notable for the following:    Magnesium 0.9 (*)    All other components within normal limits  URINE MICROSCOPIC-ADD ON - Abnormal; Notable  for the following:    Casts HYALINE CASTS (*)    All other components within normal limits  CBG MONITORING, ED - Abnormal; Notable for the following:    Glucose-Capillary 183 (*)    All other components within normal limits  I-STAT TROPOININ, ED    Imaging Review Dg Chest 2 View  05/26/2015   CLINICAL DATA:  Acute onset of generalized weakness, fever and increased urinary frequency. Initial encounter.  EXAM: CHEST  2 VIEW  COMPARISON:  Chest radiograph from 10/02/2014  FINDINGS: The lungs are mildly hypoexpanded. Vascular crowding and vascular congestion are seen. There is no evidence of pleural effusion or pneumothorax.  The heart is normal in size. The patient is status post median sternotomy. No acute osseous abnormalities are seen.  IMPRESSION: Lungs mildly hypoexpanded, with underlying vascular congestion. No definite focal airspace consolidation seen.   Electronically Signed   By: Garald Balding M.D.   On: 05/26/2015 19:39   Ct Head Wo Contrast  05/26/2015   CLINICAL DATA:  Acute onset of fever and weakness. Initial encounter.  EXAM: CT HEAD WITHOUT CONTRAST  TECHNIQUE: Contiguous axial images were obtained from the base of the skull through the vertex without intravenous contrast.  COMPARISON:  CT of the head performed 08/01/2014  FINDINGS: There is no evidence of acute infarction, mass lesion, or intra- or extra-axial hemorrhage on CT.  Prominence of the ventricles and sulci reflects moderate cortical volume loss. A small chronic infarct is noted at the high left frontoparietal region. Scattered periventricular and subcortical white matter change likely reflects small vessel ischemic microangiopathy. Mild cerebellar atrophy is noted, with a chronic infarct at the inferior left cerebellar hemisphere.  The brainstem and fourth ventricle are within normal limits. The basal ganglia are unremarkable in appearance. No mass effect or midline shift is seen.  There is no evidence of fracture; visualized  osseous structures are unremarkable in appearance. The orbits are within normal limits. A small mucus retention cyst or polyp is noted at the right maxillary sinus. The remaining paranasal sinuses and mastoid air cells are well-aerated. No significant soft tissue abnormalities are seen.  IMPRESSION: 1. No acute intracranial pathology seen on CT. 2. Moderate cortical volume loss and scattered small vessel ischemic microangiopathy. 3. Small chronic infarct at the high left frontoparietal region. Mild cerebellar atrophy, with chronic infarct at the inferior left cerebellar hemisphere. 4. Small mucus retention cyst or polyp at the right maxillary sinus.   Electronically Signed   By: Garald Balding M.D.   On: 05/26/2015 20:53   Dg Knee Complete 4 Views Left  05/26/2015   CLINICAL DATA:  Left knee pain/swelling  EXAM: LEFT KNEE - COMPLETE 4+ VIEW  COMPARISON:  03/05/2014  FINDINGS: No fracture or dislocation is seen.  Mild tricompartmental degenerative changes.  Moderate suprapatellar knee joint effusion.  IMPRESSION: No fracture or dislocation is seen.  Mild degenerative changes.  Moderate suprapatellar knee joint effusion.   Electronically Signed   By: Julian Hy M.D.   On: 05/26/2015 21:15   I, Danae Chen Karston Hyland, personally reviewed and evaluated these images and lab results as part of my medical decision-making.  MDM   Final diagnoses:  Fall  Generalized weakness  Hypokalemia  Hypomagnesemia    1. Hypokalemia & Hypomagnesemia - Multiple electrolyte abnormalities noted, likely contributing to his generalized weakness - Potassium and Mag repleted in ED - Will be admitted for further management of electrolyte abnormalities 3. Fall - Not requesting pain medicine at this time - Head CT negative for acute findings - Knee xray showing effusion, no acute injuries to knee   Josephina Gip, PA-C 05/26/15 2343  Leo Grosser, MD 05/27/15 [phone]

## 2015-05-26 NOTE — ED Notes (Signed)
Patient given sandwich.  

## 2015-05-26 NOTE — ED Notes (Signed)
Pt in xray

## 2015-05-27 ENCOUNTER — Other Ambulatory Visit: Payer: Self-pay

## 2015-05-27 DIAGNOSIS — K632 Fistula of intestine: Secondary | ICD-10-CM | POA: Diagnosis not present

## 2015-05-27 DIAGNOSIS — C50929 Malignant neoplasm of unspecified site of unspecified male breast: Secondary | ICD-10-CM | POA: Diagnosis not present

## 2015-05-27 DIAGNOSIS — R531 Weakness: Secondary | ICD-10-CM

## 2015-05-27 DIAGNOSIS — E876 Hypokalemia: Secondary | ICD-10-CM | POA: Diagnosis not present

## 2015-05-27 DIAGNOSIS — E119 Type 2 diabetes mellitus without complications: Secondary | ICD-10-CM | POA: Diagnosis not present

## 2015-05-27 DIAGNOSIS — F1010 Alcohol abuse, uncomplicated: Secondary | ICD-10-CM | POA: Diagnosis not present

## 2015-05-27 DIAGNOSIS — R350 Frequency of micturition: Secondary | ICD-10-CM | POA: Diagnosis not present

## 2015-05-27 LAB — BASIC METABOLIC PANEL
Anion gap: 12 (ref 5–15)
Anion gap: 8 (ref 5–15)
BUN: 52 mg/dL — AB (ref 6–20)
BUN: 47 mg/dL — AB (ref 6–20)
CHLORIDE: 95 mmol/L — AB (ref 101–111)
CHLORIDE: 97 mmol/L — AB (ref 101–111)
CO2: 30 mmol/L (ref 22–32)
CO2: 32 mmol/L (ref 22–32)
CREATININE: 0.99 mg/dL (ref 0.61–1.24)
Calcium: 8.1 mg/dL — ABNORMAL LOW (ref 8.9–10.3)
Calcium: 8.3 mg/dL — ABNORMAL LOW (ref 8.9–10.3)
Creatinine, Ser: 1.07 mg/dL (ref 0.61–1.24)
GFR calc Af Amer: >60 mL/min (ref >60)
GFR calc Af Amer: >60 mL/min (ref >60)
GFR calc non Af Amer: >60 mL/min (ref >60)
GLUCOSE: 181 mg/dL — AB (ref 65–99)
GLUCOSE: 155 mg/dL — AB (ref 65–99)
POTASSIUM: 2.8 mmol/L — AB (ref 3.5–5.1)
POTASSIUM: 3.4 mmol/L — AB (ref 3.5–5.1)
Sodium: 137 mmol/L (ref 135–145)
Sodium: 137 mmol/L (ref 135–145)

## 2015-05-27 LAB — MAGNESIUM: Magnesium: 1.3 mg/dL — ABNORMAL LOW (ref 1.7–2.4)

## 2015-05-27 LAB — GLUCOSE, CAPILLARY
GLUCOSE-CAPILLARY: 157 mg/dL — AB (ref 65–99)
GLUCOSE-CAPILLARY: 151 mg/dL — AB (ref 65–99)
Glucose-Capillary: 174 mg/dL — ABNORMAL HIGH (ref 65–99)
Glucose-Capillary: 186 mg/dL — ABNORMAL HIGH (ref 65–99)
Glucose-Capillary: 153 mg/dL — ABNORMAL HIGH (ref 65–99)
Glucose-Capillary: 136 mg/dL — ABNORMAL HIGH (ref 65–99)

## 2015-05-27 MED ORDER — OXYCODONE HCL 10 MG PO TABS: 10.0000 mg | ORAL_TABLET | Freq: Four times a day (QID) | ORAL | Status: DC | PRN

## 2015-05-27 MED ORDER — OXYCODONE HCL 5 MG PO TABS
10.0000 mg | ORAL_TABLET | Freq: Four times a day (QID) | ORAL | Status: DC | PRN
  Administered 2015-05-27 – 2015-05-29 (×6): 10 mg via ORAL
  Filled 2015-05-27 (×6): qty 2

## 2015-05-27 MED ORDER — TAMOXIFEN CITRATE 20 MG PO TABS: 20.0000 mg | ORAL_TABLET | Freq: Every day | ORAL | Status: DC

## 2015-05-27 MED ORDER — FOLIC ACID 1 MG PO TABS
1.0000 mg | ORAL_TABLET | Freq: Every day | ORAL | Status: DC
  Administered 2015-05-27 – 2015-05-29 (×3): 1 mg via ORAL
  Filled 2015-05-27 (×5): qty 1

## 2015-05-27 MED ORDER — GABAPENTIN 300 MG PO CAPS
300.0000 mg | ORAL_CAPSULE | Freq: Two times a day (BID) | ORAL | Status: DC
  Administered 2015-05-27 – 2015-05-29 (×5): 300 mg via ORAL
  Filled 2015-05-27 (×10): qty 1

## 2015-05-27 MED ORDER — FUROSEMIDE 80 MG PO TABS
80.0000 mg | ORAL_TABLET | Freq: Two times a day (BID) | ORAL | Status: DC
  Administered 2015-05-27 – 2015-05-29 (×4): 80 mg via ORAL
  Filled 2015-05-27 (×6): qty 1

## 2015-05-27 MED ORDER — THIAMINE HCL 100 MG/ML IJ SOLN
100.0000 mg | Freq: Every day | INTRAMUSCULAR | Status: DC
  Filled 2015-05-27: qty 2
  Filled 2015-05-27: qty 1

## 2015-05-27 MED ORDER — TAMOXIFEN CITRATE 10 MG PO TABS
20.0000 mg | ORAL_TABLET | Freq: Every day | ORAL | Status: DC
  Administered 2015-05-27 – 2015-05-29 (×3): 20 mg via ORAL
  Filled 2015-05-27 (×3): qty 2

## 2015-05-27 MED ORDER — LORAZEPAM 1 MG PO TABS: 1.0000 mg | ORAL_TABLET | Freq: Four times a day (QID) | ORAL | Status: DC | PRN

## 2015-05-27 MED ORDER — VENLAFAXINE HCL ER 37.5 MG PO CP24
37.5000 mg | ORAL_CAPSULE | Freq: Every day | ORAL | Status: DC
  Administered 2015-05-27 – 2015-05-29 (×3): 37.5 mg via ORAL
  Filled 2015-05-27 (×3): qty 1

## 2015-05-27 MED ORDER — SODIUM CHLORIDE 0.9 % IJ SOLN
3.0000 mL | Freq: Two times a day (BID) | INTRAMUSCULAR | Status: DC
  Administered 2015-05-27 – 2015-05-29 (×5): 3 mL via INTRAVENOUS

## 2015-05-27 MED ORDER — FOLIC ACID 1 MG PO TABS: 1.0000 mg | ORAL_TABLET | Freq: Every day | ORAL | Status: DC

## 2015-05-27 MED ORDER — ADULT MULTIVITAMIN W/MINERALS CH
1.0000 | ORAL_TABLET | Freq: Every day | ORAL | Status: DC
  Administered 2015-05-27 – 2015-05-29 (×3): 1 via ORAL
  Filled 2015-05-27 (×4): qty 1

## 2015-05-27 MED ORDER — ATORVASTATIN CALCIUM 20 MG PO TABS
20.0000 mg | ORAL_TABLET | Freq: Every day | ORAL | Status: DC
Start: 2015-05-27 — End: 2015-05-29
  Administered 2015-05-27 – 2015-05-28 (×2): 20 mg via ORAL
  Filled 2015-05-27 (×4): qty 1

## 2015-05-27 MED ORDER — COLCHICINE 0.6 MG PO TABS
0.6000 mg | ORAL_TABLET | Freq: Every day | ORAL | Status: DC
  Administered 2015-05-27 – 2015-05-29 (×3): 0.6 mg via ORAL
  Filled 2015-05-27 (×5): qty 1

## 2015-05-27 MED ORDER — INSULIN ASPART 100 UNIT/ML ~~LOC~~ SOLN
0.0000 [IU] | Freq: Three times a day (TID) | SUBCUTANEOUS | Status: DC
  Administered 2015-05-27 – 2015-05-28 (×4): 3 [IU] via SUBCUTANEOUS
  Administered 2015-05-28: 2 [IU] via SUBCUTANEOUS
  Administered 2015-05-29: 8 [IU] via SUBCUTANEOUS

## 2015-05-27 MED ORDER — CARVEDILOL 3.125 MG PO TABS
3.1250 mg | ORAL_TABLET | Freq: Two times a day (BID) | ORAL | Status: DC
  Administered 2015-05-27 – 2015-05-29 (×5): 3.125 mg via ORAL
  Filled 2015-05-27 (×7): qty 1

## 2015-05-27 MED ORDER — ACETAMINOPHEN 650 MG RE SUPP
650.0000 mg | Freq: Four times a day (QID) | RECTAL | Status: DC | PRN
  Filled 2015-05-27: qty 1

## 2015-05-27 MED ORDER — POTASSIUM CHLORIDE CRYS ER 20 MEQ PO TBCR
40.0000 meq | EXTENDED_RELEASE_TABLET | ORAL | Status: AC
  Administered 2015-05-27 (×3): 40 meq via ORAL
  Filled 2015-05-27 (×4): qty 2

## 2015-05-27 MED ORDER — FLUOXETINE HCL 20 MG PO CAPS
20.0000 mg | ORAL_CAPSULE | Freq: Every day | ORAL | Status: DC
Start: 2015-05-27 — End: 2015-05-29
  Administered 2015-05-27 – 2015-05-29 (×3): 20 mg via ORAL
  Filled 2015-05-27 (×5): qty 1

## 2015-05-27 MED ORDER — ASPIRIN EC 325 MG PO TBEC
325.0000 mg | DELAYED_RELEASE_TABLET | Freq: Every day | ORAL | Status: DC
Start: 2015-05-27 — End: 2015-05-29
  Administered 2015-05-27 – 2015-05-29 (×3): 325 mg via ORAL
  Filled 2015-05-27 (×5): qty 1

## 2015-05-27 MED ORDER — POLYETHYLENE GLYCOL 3350 17 G PO PACK
17.0000 g | PACK | Freq: Every day | ORAL | Status: DC
  Administered 2015-05-29: 17 g via ORAL
  Filled 2015-05-27 (×4): qty 1

## 2015-05-27 MED ORDER — CARVEDILOL 3.125 MG PO TABS: 3.1250 mg | ORAL_TABLET | Freq: Two times a day (BID) | ORAL | Status: DC

## 2015-05-27 MED ORDER — BUDESONIDE 0.25 MG/2ML IN SUSP
0.2500 mg | Freq: Two times a day (BID) | RESPIRATORY_TRACT | Status: DC
  Administered 2015-05-27 – 2015-05-29 (×4): 0.25 mg via RESPIRATORY_TRACT
  Filled 2015-05-27 (×7): qty 2

## 2015-05-27 MED ORDER — ONDANSETRON HCL 4 MG PO TABS
4.0000 mg | ORAL_TABLET | Freq: Four times a day (QID) | ORAL | Status: DC | PRN
Start: 2015-05-27 — End: 2015-05-29

## 2015-05-27 MED ORDER — VITAMIN B-1 100 MG PO TABS
100.0000 mg | ORAL_TABLET | Freq: Every day | ORAL | Status: DC
  Administered 2015-05-27 – 2015-05-29 (×3): 100 mg via ORAL
  Filled 2015-05-27 (×5): qty 1

## 2015-05-27 MED ORDER — INSULIN GLARGINE 100 UNIT/ML ~~LOC~~ SOLN
14.0000 [IU] | Freq: Every day | SUBCUTANEOUS | Status: DC
  Administered 2015-05-27 – 2015-05-28 (×3): 14 [IU] via SUBCUTANEOUS
  Filled 2015-05-27 (×4): qty 0.14

## 2015-05-27 MED ORDER — ENOXAPARIN SODIUM 60 MG/0.6ML ~~LOC~~ SOLN
50.0000 mg | SUBCUTANEOUS | Status: DC
  Administered 2015-05-27 – 2015-05-29 (×3): 50 mg via SUBCUTANEOUS
  Filled 2015-05-27 (×6): qty 0.6

## 2015-05-27 MED ORDER — BISACODYL 10 MG RE SUPP: 10.0000 mg | Freq: Every day | RECTAL | Status: DC | PRN

## 2015-05-27 MED ORDER — ONDANSETRON HCL 4 MG/2ML IJ SOLN: 4.0000 mg | Freq: Four times a day (QID) | INTRAMUSCULAR | Status: DC | PRN

## 2015-05-27 MED ORDER — ACETAMINOPHEN 325 MG PO TABS
650.0000 mg | ORAL_TABLET | Freq: Four times a day (QID) | ORAL | Status: DC | PRN
  Administered 2015-05-28: 650 mg via ORAL
  Filled 2015-05-27: qty 2

## 2015-05-27 MED ORDER — POTASSIUM CHLORIDE CRYS ER 20 MEQ PO TBCR
40.0000 meq | EXTENDED_RELEASE_TABLET | Freq: Two times a day (BID) | ORAL | Status: DC
  Administered 2015-05-27 – 2015-05-29 (×4): 40 meq via ORAL
  Filled 2015-05-27 (×6): qty 2

## 2015-05-27 MED ORDER — SODIUM CHLORIDE 0.9 % IV SOLN
INTRAVENOUS | Status: DC
  Administered 2015-05-27: 02:00:00 via INTRAVENOUS

## 2015-05-27 MED ORDER — INSULIN ASPART 100 UNIT/ML ~~LOC~~ SOLN: 0.0000 [IU] | Freq: Every day | SUBCUTANEOUS | Status: DC

## 2015-05-27 MED ORDER — LORAZEPAM 2 MG/ML IJ SOLN: 1.0000 mg | Freq: Four times a day (QID) | INTRAMUSCULAR | Status: DC | PRN

## 2015-05-27 MED ORDER — ALLOPURINOL 300 MG PO TABS
300.0000 mg | ORAL_TABLET | Freq: Every day | ORAL | Status: DC
  Administered 2015-05-27 – 2015-05-29 (×3): 300 mg via ORAL
  Filled 2015-05-27 (×5): qty 1

## 2015-05-27 NOTE — ED Notes (Signed)
Called daughter to inform which room pt was moved to

## 2015-05-27 NOTE — Progress Notes (Signed)
Patient ID: [name], male   DOB: February 01, 1946, 69 y.o.   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  He is scheduled for repair of this next Monday.  He denies any abdominal pain or new symptoms  Objective: Vital signs in last 24 hours: Temp:  [97.5 F (36.4 C)-98 F (36.7 C)] 97.8 F (36.6 C) (08/16 0700) Pulse Rate:  [79-101] 92 (08/16 0700) Resp:  [14-25] 20 (08/16 0700) BP: (115-162)/(59-96) 144/84 mmHg (08/16 0700) SpO2:  [81 %-100 %] 98 % (08/16 0700) Weight:  [105.915 kg (233 lb 8 oz)-111.131 kg (245 lb)] 105.915 kg (233 lb 8 oz) (08/16 0521)    Intake/Output from previous day: 08/15 0701 - 08/16 0700 In: 516.7 [P.O.:330; I.V.:186.7] Out: 975 [Urine:975] Intake/Output this shift: Total I/O In: 720 [P.O.:720] Out: 950 [Urine:950]  PE: Abd: soft, NT, Nd, fistula covered with ostomy pouch, some enteric output noted  Lab Results:   Recent Labs  05/26/15 1610  WBC 12.4*  HGB 13.5  HCT 41.2  PLT 148*   BMET  Recent Labs  05/26/15 1610 05/27/15 0401  NA 134* 137  K 2.7* 2.8*  CL 89* 95*  CO2 30 30  GLUCOSE 176* 181*  BUN 56* 52*  CREATININE 1.26* 1.07  CALCIUM 8.3* 8.1*   PT/INR No results for input(s): LABPROT, INR in the last 72 hours. CMP     Component Value Date/Time   NA 137 05/27/2015 0401   NA 138 10/22/2014 1407   K 2.8* 05/27/2015 0401   K 3.9 10/22/2014 1407   CL 95* 05/27/2015 0401   CO2 30 05/27/2015 0401   CO2 30* 10/22/2014 1407   GLUCOSE 181* 05/27/2015 0401   GLUCOSE 130 10/22/2014 1407   BUN 52* 05/27/2015 0401   BUN 55.3* 10/22/2014 1407   CREATININE 1.07 05/27/2015 0401   CREATININE 1.4* 10/22/2014 1407   CALCIUM 8.1* 05/27/2015 0401   CALCIUM 8.6 10/22/2014 1407   PROT 7.1 11/11/2014 0827   PROT 6.6 10/22/2014 1407   ALBUMIN 3.4* 11/11/2014 0827   ALBUMIN 3.2* 10/22/2014 1407   AST 18 11/11/2014 0827   AST 16 10/22/2014 1407   ALT 12 11/11/2014 0827   ALT 6 10/22/2014  1407   ALKPHOS 61 11/11/2014 0827   ALKPHOS 68 10/22/2014 1407   BILITOT 0.6 11/11/2014 0827   BILITOT 0.46 10/22/2014 1407   GFRNONAA >60 05/27/2015 0401   GFRAA >60 05/27/2015 0401   Lipase     Component Value Date/Time   LIPASE 41 12/25/2012 1452       Studies/Results: Dg Chest 2 View  05/26/2015   CLINICAL DATA:  Acute onset of generalized weakness, fever and increased urinary frequency. Initial encounter.  EXAM: CHEST  2 VIEW  COMPARISON:  Chest radiograph from 10/02/2014  FINDINGS: The lungs are mildly hypoexpanded. Vascular crowding and vascular congestion are seen. There is no evidence of pleural effusion or pneumothorax.  The heart is normal in size. The patient is status post median sternotomy. No acute osseous abnormalities are seen.  IMPRESSION: Lungs mildly hypoexpanded, with underlying vascular congestion. No definite focal airspace consolidation seen.   Electronically Signed   By: Garald Balding M.D.   On: 05/26/2015 19:39   Ct Head Wo Contrast  05/26/2015   CLINICAL DATA:  Acute onset of fever and weakness. Initial encounter.  EXAM: CT HEAD WITHOUT CONTRAST  TECHNIQUE: Contiguous axial images were obtained from the base of the skull through  the vertex without intravenous contrast.  COMPARISON:  CT of the head performed 08/01/2014  FINDINGS: There is no evidence of acute infarction, mass lesion, or intra- or extra-axial hemorrhage on CT.  Prominence of the ventricles and sulci reflects moderate cortical volume loss. A small chronic infarct is noted at the high left frontoparietal region. Scattered periventricular and subcortical white matter change likely reflects small vessel ischemic microangiopathy. Mild cerebellar atrophy is noted, with a chronic infarct at the inferior left cerebellar hemisphere.  The brainstem and fourth ventricle are within normal limits. The basal ganglia are unremarkable in appearance. No mass effect or midline shift is seen.  There is no evidence of  fracture; visualized osseous structures are unremarkable in appearance. The orbits are within normal limits. A small mucus retention cyst or polyp is noted at the right maxillary sinus. The remaining paranasal sinuses and mastoid air cells are well-aerated. No significant soft tissue abnormalities are seen.  IMPRESSION: 1. No acute intracranial pathology seen on CT. 2. Moderate cortical volume loss and scattered small vessel ischemic microangiopathy. 3. Small chronic infarct at the high left frontoparietal region. Mild cerebellar atrophy, with chronic infarct at the inferior left cerebellar hemisphere. 4. Small mucus retention cyst or polyp at the right maxillary sinus.   Electronically Signed   By: Garald Balding M.D.   On: 05/26/2015 20:53   Dg Knee Complete 4 Views Left  05/26/2015   CLINICAL DATA:  Left knee pain/swelling  EXAM: LEFT KNEE - COMPLETE 4+ VIEW  COMPARISON:  03/05/2014  FINDINGS: No fracture or dislocation is seen.  Mild tricompartmental degenerative changes.  Moderate suprapatellar knee joint effusion.  IMPRESSION: No fracture or dislocation is seen.  Mild degenerative changes.  Moderate suprapatellar knee joint effusion.   Electronically Signed   By: Julian Hy M.D.   On: 05/26/2015 21:15    Anti-infectives: Anti-infectives    None       Assessment/Plan  1. Known EC fistula -scheduled for repair next Monday.  I have let Dr. Ninfa Linden know about this and as of now, he still plans to proceed with surgery at his scheduled date. -cont ostomy pouch to control drainage. -please call if any other problems arise.      Stiven Kaspar E 05/27/2015, 11:46 AM Pager: [phone]

## 2015-05-27 NOTE — Evaluation (Signed)
Physical Therapy Evaluation Patient Details Name: [name] MRN: [id number] DOB: [birth year] Today's Date: 05/27/2015   History of Present Illness  [name] is a 69 y.o. male with hx of DM/ CAD/ CABG, DM2, HTN, gout, HL, etoh abuse, afib. 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 In Oct '15 had gait dysfunction and cerebellar CVA. Hx etoh abuse. In Feb '16 had R mastectomy for male breast cancer. Presenting 8/15 with generalized weakness to ED. Work up showed hypokalemia and low Mg  Clinical Impression  Pt pleasant but fatigued with report of decreased sleep. Pt with impaired cognition, strength and mobility with mod-max assist for bed mobility to sit today and unable to attempt standing secondary to pain and weakness. Family assist at home but unable to provide max physical assist. Pt will benefit from acute therapy to maximize mobility, gait, balance and strength to increase function and decrease burden of care. Pt was mod I with RW PTA and would benefit from CIR if able to progress with therapies. Will follow.     Follow Up Recommendations CIR;Supervision/Assistance - 24 hour    Equipment Recommendations  None recommended by PT    Recommendations for Other Services OT consult     Precautions / Restrictions Precautions Precautions: Fall Precaution Comments: sore left knee Restrictions Weight Bearing Restrictions: No      Mobility  Bed Mobility Overal bed mobility: Needs Assistance Bed Mobility: Supine to Sit;Sit to Supine     Supine to sit: Mod assist;HOB elevated Sit to supine: Mod assist   General bed mobility comments: increased assist to bring bil LE to EOB as well as elevating trunk. Assist to bring bil LE back onto surface for return to supine  Transfers                    Ambulation/Gait                Stairs            Wheelchair Mobility    Modified Rankin (Stroke Patients Only)       Balance Overall balance assessment: Needs assistance Sitting-balance support: Feet unsupported Sitting balance-Leahy Scale: Fair Sitting balance - Comments: EOB 4 min with posterior lean particularly with leg perturbation Postural control: Posterior lean                                   Pertinent Vitals/Pain Pain Assessment: Faces Pain Score: 5  Pain Location: left knee pain with all movement Pain Descriptors / Indicators: Aching Pain Intervention(s): Limited activity within patient's tolerance;Repositioned    Home Living Family/patient expects to be discharged to:: Private residence Living Arrangements: Children Available Help at Discharge: Family Type of Home: House Home Access: Stairs to enter Entrance Stairs-Rails: Psychiatric nurse of Steps: 3 Home Layout: One level Home Equipment: Walker - 2 wheels;Grab bars - toilet;Grab bars - tub/shower;Wheelchair - manual Additional Comments: Pt received lt AFO during rehab stay in October.    Prior Function           Comments: Amb with rolling walker. Has not been wearing AFO on regular basis. States he has some help with bathing. Difficulty recalling accurate PLOF with assist of history from last admission     Hand Dominance        Extremity/Trunk Assessment   Upper Extremity Assessment: Generalized weakness  Lower Extremity Assessment: Generalized weakness RLE Deficits / Details: hip flexion 3/5, knee extension 4+/5 LLE Deficits / Details: unable to fully assess secondary to knee pain, assist with all hip ROM and mobility today     Communication   Communication: No difficulties  Cognition Arousal/Alertness: Awake/alert Behavior During Therapy: WFL for tasks assessed/performed Overall Cognitive Status: Impaired/Different from baseline Area of Impairment: Orientation;Safety/judgement;Problem solving Orientation Level: Disoriented to;Time   Memory: Decreased short-term  memory   Safety/Judgement: Decreased awareness of safety;Decreased awareness of deficits     General Comments: pt aware of place and year, not day or month    General Comments      Exercises        Assessment/Plan    PT Assessment Patient needs continued PT services  PT Diagnosis Difficulty walking;Generalized weakness;Altered mental status   PT Problem List Decreased strength;Decreased cognition;Decreased range of motion;Decreased activity tolerance;Decreased balance;Pain;Decreased mobility  PT Treatment Interventions DME instruction;Balance training;Gait training;Functional mobility training;Therapeutic activities;Therapeutic exercise;Patient/family education;Cognitive remediation   PT Goals (Current goals can be found in the Care Plan section) Acute Rehab PT Goals Patient Stated Goal: return home PT Goal Formulation: With patient Time For Goal Achievement: 06/10/15 Potential to Achieve Goals: Fair    Frequency Min 3X/week   Barriers to discharge Decreased caregiver support      Co-evaluation               End of Session   Activity Tolerance: Patient limited by fatigue;Patient limited by pain Patient left: in bed;with call bell/phone within reach;with bed alarm set Nurse Communication: Mobility status    Functional Assessment Tool Used: clinical judgement Functional Limitation: Mobility: Walking and moving around Mobility: Walking and Moving Around Current Status (T3428): At least 60 percent but less than 80 percent impaired, limited or restricted Mobility: Walking and Moving Around Goal Status [phone]): At least 20 percent but less than 40 percent impaired, limited or restricted    Time: 0930-0946 PT Time Calculation (min) (ACUTE ONLY): 16 min   Charges:   PT Evaluation $Initial PT Evaluation Tier I: 1 Procedure     PT G Codes:   PT G-Codes **NOT FOR INPATIENT CLASS** Functional Assessment Tool Used: clinical judgement Functional Limitation:  Mobility: Walking and moving around Mobility: Walking and Moving Around Current Status (X7262): At least 60 percent but less than 80 percent impaired, limited or restricted Mobility: Walking and Moving Around Goal Status [phone]): At least 20 percent but less than 40 percent impaired, limited or restricted    [name] 05/27/2015, 9:54 AM Elwyn Reach, [place]

## 2015-05-27 NOTE — Progress Notes (Signed)
Received pt report from [facility].

## 2015-05-27 NOTE — Progress Notes (Signed)
TEVIS DUNAVAN [id number] Admission Data: 05/27/2015 12:28 AM Attending Provider: Roney Jaffe, MD GUR:KYHCWC,BJSEG, MD Code Status: DNR  [name] is a 69 y.o. male patient admitted from ED:  -No acute distress noted.  -No complaints of shortness of breath.  -No complaints of chest pain.   Cardiac Monitoring: Box # 16 in place. Cardiac monitor yields:atrial fibrillation, with ventricular rate of 89.  Blood pressure 148/67, pulse 79, temperature 97.5 F (36.4 C), temperature source Oral, resp. rate 17, height 5\' 7"  (1.702 m), weight 111.131 kg (245 lb), SpO2 100 %.   IV Fluids:  IV in place, occlusive dsg intact without redness, IV cath forearm left, condition patent and no redness none.   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); CAROTID STENOSIS (06/20/2009); DM (06/20/2009); CAD (06/20/2009); HYPERLIPIDEMIA (06/20/2009); HYPERTENSION (06/20/2009); Hypothyroidism; Low back pain; Asthma; Pneumonia; Persistent atrial fibrillation; History of tobacco abuse; Morbidly obese; Chronic diastolic heart failure, NYHA class 3; Falls frequently; cardiovascular stress test (05/2014); Breast cancer, right breast; Gout; OBSTRUCTIVE SLEEP APNEA (06/20/2009); Stroke; Shortness of breath dyspnea; Depression; GERD (gastroesophageal reflux disease); Arthritis; Neuromuscular disorder; Constipation; ETOH abuse; 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  Past Surgical History:   has past surgical history that includes Carotid endarterectomy (2002); Coronary artery bypass graft; Umbilical hernia repair (N/A, 01/28/2014); laparotomy (N/A, 01/28/2014); Bowel resection (N/A, 01/28/2014); Hernia repair; Colonoscopy; Total mastectomy (Right, 11/11/2014); and Cataract extraction w/PHACO (Left, 04/08/2015).  Social History:   reports  that he quit smoking about 46 years ago. He has never used smokeless tobacco. He reports that he drinks alcohol. He reports that he uses illicit drugs (Marijuana).  Skin: charted on CHL  Patient/Family orientated to room. Information packet given to patient/family. Admission inpatient armband information verified with patient/family to include name and date of birth and placed on patient arm. Side rails up x 2, fall assessment and education completed with patient/family. Patient/family able to verbalize understanding of risk associated with falls and verbalized understanding to call for assistance before getting out of bed. Call light within reach. Patient/family able to voice and demonstrate understanding of unit orientation instructions.

## 2015-05-27 NOTE — Consult Note (Addendum)
WOC wound consult note Reason for Consult: Consult requested for abd full thickness wound. Pt states he has been followed prior to admission by the surgical team and he is scheduled for a hernia repair next week.  He states the wound recently has increased drainage. Wound type: Full thickness to middle abd Measurement: .8X.8X4cm with tunneling depth  Wound bed: Unable to visualize R/T the narrow opening Drainage (amount, consistency, odor) Large amt green drainage, no odor Periwound: Red macerated skin surrounding wound; appearance consistent with moisture associated skin damage to 3 cm surrounding wound. Dressing procedure/placement/frequency: Drainage consistent with possible fistula from wound; please consult surgical team for further plan of care.  Applied ostomy pouch to assist with containing output; this may not adhere related to deep creases surrounding wound.  If pouch is not successful, then gauze and ABD pad dressings can be resumed.   Please re-consult if further assistance is needed.  Thank-you,  Julien Girt MSN, [place], [place], [place], [place]

## 2015-05-27 NOTE — Progress Notes (Signed)
Pt daughter [name]) called and updated pt status.

## 2015-05-27 NOTE — Progress Notes (Addendum)
Rehab Admissions Coordinator Note:  Patient was screened by Cleatrice Burke for appropriateness for an Inpatient Acute Rehab Consult per PT recommendation.  At this time, we are recommending await postop progress for noted plans for surgery Monday. . Contacted by Dr. Nigel Bridgeman, pt ready for d/c with plans to return for OR Monday. I would not pursue CIR admission in the interim. Rec HH vs SNF.  Cleatrice Burke 05/27/2015, 2:05 PM  I can be reached at [phone].

## 2015-05-27 NOTE — Progress Notes (Signed)
PATIENT DETAILS Name: [name] Age: 69 y.o. Sex: male Date of Birth: Sep 27, 1946 Admit Date: 05/26/2015 Admitting Physician Roney Jaffe, MD GYF:VCBSWH,QPRFF, MD  Subjective: Still feels weak-claims he has occasional subjective fever as well.  Assessment/Plan: Principal Problem: Generalized weakness: Likely from severe hypokalemia. No evidence of infection-although gives history of subjective fever-UA and chest x-ray negative. Repeat potassium, PT evaluation. Follow clinical course  Active Problems: Hypokalemia and hypomagnesemia: Suspect multifactorial from diuretics and alcohol intake. Replete and recheck. Not sure who is managing this patient's high-dose diuretics as an outpatient-poor historian.  History of chronic atrial fibrillation: Rate controlled with Coreg, reviewed outpatient cardiology note 5/25-not a candidate for anticoagulation because of alcohol consumption.  Chronic diastolic heart failure: On significant high doses of Lasix-currently on hold due to evidence of clinical dehydration and hypokalemia-wIll stop IV fluids and restart Lasix at half of outpatient dosing.  EtOH abuse: No signs of withdrawal, start CIWA protocol with Ativan  History of right breast cancer: Status post mastectomy, continue tamoxifen.Follow with Dr Lindi Adie  History of cerebellar CVA: Nonfocal exam, continue aspirin/statin. See above regarding anticoagulation  History of CAD: Without chest pain/shortness of breath. 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 Await surgery input.  CKD stage 3: creatinine close to baseline. Has proteinuria, likely secondary to diabetes. Follow  Essential hypertension: Moderate controlled, continue Coreg, restarted Lasix. Follow BP    Chronic debility:PT eval  Diabetes on insulin: CBGs stable, continue with Lantus 14 units along with SSI. Follow.  History of gout: No evidence of flare-continue allopurinol and colchicine  Chronic pain syndrome: Continue oxycodone. Place on MiraLAX  Disposition: Remain inpatient  Antimicrobial agents  See below  Anti-infectives    None      DVT Prophylaxis: Prophylactic Lovenox   Code Status: Full code   Family Communication None at bedside  Procedures: None  CONSULTS:  None  Time spent 25  minutes-Greater than 50% of this time was spent in counseling, explanation of diagnosis, planning of further management, and coordination of care.  MEDICATIONS: Scheduled Meds: . allopurinol  300 mg Oral Daily  . aspirin EC  325 mg Oral Daily  . atorvastatin  20 mg Oral q1800  . budesonide  0.25 mg Nebulization BID  . carvedilol  3.125 mg Oral BID WC  . colchicine  0.6 mg Oral Daily  . enoxaparin (LOVENOX) injection  50 mg Subcutaneous Q24H  . FLUoxetine  20 mg Oral Daily  . folic acid  1 mg Oral Daily  . gabapentin  300 mg Oral BID  . insulin aspart  0-15 Units Subcutaneous TID WC  . insulin aspart  0-5 Units Subcutaneous QHS  . insulin glargine  14 Units Subcutaneous QHS  . sodium chloride  3 mL Intravenous Q12H  . tamoxifen  20 mg Oral Daily  . venlafaxine XR  37.5 mg Oral Daily   Continuous Infusions: . sodium chloride 50 mL/hr at 05/27/15 0216   PRN Meds:.acetaminophen **OR** acetaminophen, bisacodyl, ondansetron **OR** ondansetron (ZOFRAN) IV, oxyCODONE    PHYSICAL EXAM: Vital signs in last 24 hours: Filed Vitals:   05/26/15 2300 05/27/15 0033 05/27/15 0521 05/27/15 0700  BP: 148/67 160/90 150/88 144/84  Pulse: 79 85 101 92  Temp:  98 F (36.7 C) 97.9 F (36.6 C) 97.8 F (36.6 C)  TempSrc:  Oral Oral Oral  Resp: 17 20 21 20   Height:  5\' 8"  (1.727 m)    Weight:  106.232 kg (234 lb 3.2 oz) 105.915 kg (233 lb 8 oz)   SpO2: 100% 95% 99% 98%     Weight change:  Filed Weights   05/26/15 1605 05/27/15 0033 05/27/15 0521  Weight: 111.131 kg (245 lb) 106.232 kg (234 lb 3.2 oz) 105.915 kg (233 lb 8 oz)   Body mass index is 35.51 kg/(m^2).   Gen Exam: Awake and alert with clear speech.  Neck: Supple, No JVD.   Chest: B/L Clear.   CVS: S1 S2 Regular, no murmurs.  Abdomen: soft, BS +, non tender, non distended. Open wound w moist dressing in place (fistula),  Extremities: 1+ edema, lower extremities warm to touch. Neurologic: Non Focal.   Skin: No Rash.   Wounds: N/A.   Intake/Output from previous day:  Intake/Output Summary (Last 24 hours) at 05/27/15 1112 Last data filed at 05/27/15 1007  Gross per 24 hour  Intake 1236.67 ml  Output   1925 ml  Net -688.33 ml    LAB RESULTS: CBC  Recent Labs Lab 05/26/15 1610  WBC 12.4*  HGB 13.5  HCT 41.2  PLT 148*  MCV 96.7  MCH 31.7  MCHC 32.8  RDW 15.3    Chemistries   Recent Labs Lab 05/26/15 1610 05/26/15 1852 05/27/15 0401  NA 134*  --  137  K 2.7*  --  2.8*  CL 89*  --  95*  CO2 30  --  30  GLUCOSE 176*  --  181*  BUN 56*  --  52*  CREATININE 1.26*  --  1.07  CALCIUM 8.3*  --  8.1*  MG  --  0.9*  --     CBG:  Recent Labs Lab 05/26/15 1559 05/26/15 2316 05/27/15 0230 05/27/15 0558  GLUCAP 183* 174* 186* 153*    GFR Estimated Creatinine Clearance: 74.7 mL/min (by C-G formula based on Cr of 1.07).  Coagulation profile No results for input(s): INR, PROTIME in the last 168 hours.  Cardiac Enzymes No results for input(s): CKMB, TROPONINI, MYOGLOBIN in the last 168 hours.  Invalid input(s): CK  Invalid input(s): POCBNP No results for input(s): DDIMER in the last 72 hours. No results for input(s): HGBA1C in the last 72 hours. No results for input(s): CHOL, HDL, LDLCALC, TRIG, CHOLHDL, LDLDIRECT in the last 72 hours. No results for input(s): TSH, T4TOTAL, T3FREE, THYROIDAB in the last 72 hours.  Invalid input(s): FREET3 No results for  input(s): VITAMINB12, FOLATE, FERRITIN, TIBC, IRON, RETICCTPCT in the last 72 hours. No results for input(s): LIPASE, AMYLASE in the last 72 hours.  Urine Studies No results for input(s): UHGB, CRYS in the last 72 hours.  Invalid input(s): UACOL, UAPR, USPG, UPH, UTP, UGL, UKET, UBIL, UNIT, UROB, ULEU, UEPI, UWBC, URBC, UBAC, CAST, UCOM, BILUA  MICROBIOLOGY: No results found for this or any previous visit (from the past 240 hour(s)).  RADIOLOGY STUDIES/RESULTS: Dg Chest 2 View  05/26/2015   CLINICAL DATA:  Acute onset of generalized weakness, fever and increased urinary frequency. Initial encounter.  EXAM: CHEST  2 VIEW  COMPARISON:  Chest radiograph from 10/02/2014  FINDINGS: The lungs are mildly hypoexpanded. Vascular crowding and vascular congestion are seen. There is no evidence of pleural effusion or pneumothorax.  The heart is normal in size. The patient is status post  median sternotomy. No acute osseous abnormalities are seen.  IMPRESSION: Lungs mildly hypoexpanded, with underlying vascular congestion. No definite focal airspace consolidation seen.   Electronically Signed   By: Garald Balding M.D.   On: 05/26/2015 19:39   Ct Head Wo Contrast  05/26/2015   CLINICAL DATA:  Acute onset of fever and weakness. Initial encounter.  EXAM: CT HEAD WITHOUT CONTRAST  TECHNIQUE: Contiguous axial images were obtained from the base of the skull through the vertex without intravenous contrast.  COMPARISON:  CT of the head performed 08/01/2014  FINDINGS: There is no evidence of acute infarction, mass lesion, or intra- or extra-axial hemorrhage on CT.  Prominence of the ventricles and sulci reflects moderate cortical volume loss. A small chronic infarct is noted at the high left frontoparietal region. Scattered periventricular and subcortical white matter change likely reflects small vessel ischemic microangiopathy. Mild cerebellar atrophy is noted, with a chronic infarct at the inferior left cerebellar  hemisphere.  The brainstem and fourth ventricle are within normal limits. The basal ganglia are unremarkable in appearance. No mass effect or midline shift is seen.  There is no evidence of fracture; visualized osseous structures are unremarkable in appearance. The orbits are within normal limits. A small mucus retention cyst or polyp is noted at the right maxillary sinus. The remaining paranasal sinuses and mastoid air cells are well-aerated. No significant soft tissue abnormalities are seen.  IMPRESSION: 1. No acute intracranial pathology seen on CT. 2. Moderate cortical volume loss and scattered small vessel ischemic microangiopathy. 3. Small chronic infarct at the high left frontoparietal region. Mild cerebellar atrophy, with chronic infarct at the inferior left cerebellar hemisphere. 4. Small mucus retention cyst or polyp at the right maxillary sinus.   Electronically Signed   By: Garald Balding M.D.   On: 05/26/2015 20:53   Dg Knee Complete 4 Views Left  05/26/2015   CLINICAL DATA:  Left knee pain/swelling  EXAM: LEFT KNEE - COMPLETE 4+ VIEW  COMPARISON:  03/05/2014  FINDINGS: No fracture or dislocation is seen.  Mild tricompartmental degenerative changes.  Moderate suprapatellar knee joint effusion.  IMPRESSION: No fracture or dislocation is seen.  Mild degenerative changes.  Moderate suprapatellar knee joint effusion.   Electronically Signed   By: Julian Hy M.D.   On: 05/26/2015 21:15    Oren Binet, MD  [facility] Pager:[phone]  If 7PM-7AM, please contact night-coverage [email or web address] Password [email or web address] 05/27/2015, 11:12 AM

## 2015-05-27 NOTE — Progress Notes (Signed)
CSW reviewed patient's chart due to PT's recommendation for CIR/supervision 24 hours a day..  Patient is current Observation status and would not be able to utilize Medicare coverage in SNF. Discussed with RNCM and will monitor for possible change in status to inpatient. CSW will monitor and assist with d/c planning as indicated.  Lorie [place]. Pauline Good, [place]

## 2015-05-28 ENCOUNTER — Inpatient Hospital Stay (HOSPITAL_COMMUNITY): Admission: RE | Admit: 2015-05-28 | Payer: Medicare Other | Source: Ambulatory Visit

## 2015-05-28 ENCOUNTER — Encounter (HOSPITAL_COMMUNITY): Payer: Self-pay | Admitting: General Practice

## 2015-05-28 DIAGNOSIS — I5032 Chronic diastolic (congestive) heart failure: Secondary | ICD-10-CM

## 2015-05-28 DIAGNOSIS — K632 Fistula of intestine: Secondary | ICD-10-CM | POA: Diagnosis not present

## 2015-05-28 DIAGNOSIS — E119 Type 2 diabetes mellitus without complications: Secondary | ICD-10-CM | POA: Diagnosis not present

## 2015-05-28 DIAGNOSIS — E876 Hypokalemia: Secondary | ICD-10-CM | POA: Diagnosis not present

## 2015-05-28 DIAGNOSIS — R531 Weakness: Secondary | ICD-10-CM | POA: Diagnosis not present

## 2015-05-28 DIAGNOSIS — F1010 Alcohol abuse, uncomplicated: Secondary | ICD-10-CM | POA: Diagnosis not present

## 2015-05-28 LAB — BASIC METABOLIC PANEL
Anion gap: 9 (ref 5–15)
BUN: 44 mg/dL — AB (ref 6–20)
CALCIUM: 8.9 mg/dL (ref 8.9–10.3)
CHLORIDE: 96 mmol/L — AB (ref 101–111)
CO2: 31 mmol/L (ref 22–32)
CREATININE: 0.9 mg/dL (ref 0.61–1.24)
GFR calc non Af Amer: >60 mL/min (ref >60)
Glucose, Bld: 132 mg/dL — ABNORMAL HIGH (ref 65–99)
Potassium: 3.7 mmol/L (ref 3.5–5.1)
SODIUM: 136 mmol/L (ref 135–145)

## 2015-05-28 LAB — GRAM STAIN

## 2015-05-28 LAB — GLUCOSE, CAPILLARY
GLUCOSE-CAPILLARY: 158 mg/dL — AB (ref 65–99)
Glucose-Capillary: 117 mg/dL — ABNORMAL HIGH (ref 65–99)
Glucose-Capillary: 150 mg/dL — ABNORMAL HIGH (ref 65–99)
Glucose-Capillary: 166 mg/dL — ABNORMAL HIGH (ref 65–99)

## 2015-05-28 LAB — MAGNESIUM: Magnesium: 1.4 mg/dL — ABNORMAL LOW (ref 1.7–2.4)

## 2015-05-28 LAB — SYNOVIAL CELL COUNT + DIFF, W/ CRYSTALS
EOSINOPHILS-SYNOVIAL: 0 % (ref 0–1)
Lymphocytes-Synovial Fld: 4 % (ref 0–20)
Monocyte-Macrophage-Synovial Fluid: 6 % — ABNORMAL LOW (ref 50–90)
NEUTROPHIL, SYNOVIAL: 90 % — AB (ref 0–25)
OTHER CELLS-SYN: 0
WBC, Synovial: 11500 /mm3 — ABNORMAL HIGH (ref 0–200)

## 2015-05-28 MED ORDER — PREDNISONE 20 MG PO TABS
40.0000 mg | ORAL_TABLET | Freq: Every day | ORAL | Status: DC
  Administered 2015-05-29: 40 mg via ORAL
  Filled 2015-05-28: qty 2

## 2015-05-28 MED ORDER — METHYLPREDNISOLONE SODIUM SUCC 125 MG IJ SOLR
60.0000 mg | Freq: Once | INTRAMUSCULAR | Status: AC
  Administered 2015-05-28: 60 mg via INTRAVENOUS
  Filled 2015-05-28: qty 2

## 2015-05-28 MED ORDER — PREDNISONE 20 MG PO TABS: 40.0000 mg | ORAL_TABLET | Freq: Every day | ORAL | Status: DC

## 2015-05-28 MED ORDER — MAGNESIUM SULFATE 2 GM/50ML IV SOLN
2.0000 g | Freq: Once | INTRAVENOUS | Status: AC
  Administered 2015-05-28: 2 g via INTRAVENOUS
  Filled 2015-05-28: qty 50

## 2015-05-28 NOTE — Consult Note (Signed)
Reason for Consult: Diuretic Dosing/ Electrolyte Abn  Requesting Physician: Nena Alexander [facility])  HPI: [name] is a 69 year old moderately overweight widowed Caucasian male who lives with his daughter. He is a patient of Dr. Reatha Armour . He also is followed by Dr. Haroldine Laws  in the heart failure clinic in the past. He was admitted with generalized weakness, hypokalemia and hypomagnesemia. His past medical history is notable for hypertension, hyperlipidemia, diabetes and chronic kidney disease stage III. He is chronic A. Fib rate controlled not on oral at regulation because of EtOH abuse. 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 He has a history of ischemic heart disease status post coronary artery bypass grafting X 4 in 2001 with a negative Myoview 05/30/14. His last echo 04/21/14 revealed preserved LV function with elevated filling pressures. He does have diastolic to function and is on high-dose direct palpation including Lasix 160 mg the morning, 80 in the evening as well as metolazone 5 mg on Monday Wednesday and Friday. His potassium on admission was 2.7 which has been repleted. Is currently 3.7. Magnesium is 1.4. His renal function is preserved. He has been diuresed with negative fluid balance of approximately 1 L. He admits to having run out of several of his medicines prior to admission including oral potassium repletion. He is a DO NOT RESUSCITATE.   Problem List: Patient Active Problem List   Diagnosis Date Noted  . Hypomagnesemia 05/27/2015  . History of male breast cancer, s/p R mastectomy Jan 2016 05/27/2015  . General weakness 05/27/2015  . Generalized weakness 05/26/2015  . Hypokalemia 10/03/2014  . Congestive heart failure 10/02/2014  . Lower extremity pain, left 10/02/2014  . Diabetes mellitus type 2, controlled 10/02/2014  . Enterocutaneous fistula   . Hypertension 08/30/2014  . COPD (chronic obstructive pulmonary disease)  08/30/2014  . Intracerebral hemorrhage 08/01/2014  . Osteoarthritis of both hands 07/30/2014  . Thrush, oral 07/28/2014  . Debility 07/25/2014  . Diabetic polyneuropathy associated with type 2 diabetes mellitus 07/25/2014  . Gout flare 07/24/2014  . Pre-operative cardiovascular examination 07/15/2014  . Family history of malignant neoplasm of gastrointestinal tract 06/12/2014  . Panniculitis 04/14/2013  . Seasonal allergies 12/20/2012  . L1 vertebral fracture 09/07/2012  . Hypothyroidism 08/30/2012  . ETOH abuse   . PAF (paroxysmal atrial fibrillation)   . Chronic diastolic heart failure   . DM (diabetes mellitus) type II uncontrolled, periph vascular disorder 06/20/2009  . Obstructive sleep apnea 06/20/2009  . CAROTID STENOSIS 06/20/2009  . CHRONIC OBSTRUCTIVE PULMONARY DISEASE 06/20/2009  . Coronary atherosclerosis of native coronary artery 06/20/2009  . Hypderlipidemia   . Hypertensive heart disease     PMHx:  Past Medical History  Diagnosis Date  . CHRONIC OBSTRUCTIVE PULMONARY DISEASE 06/20/2009  . CAROTID STENOSIS 06/20/2009    A. 08/2001 s/p L CEA;  B.   09/14/11 - Carotid U/S - 40-59% bilateral stenosis, left CEA patch angioplasty is patent  . DM 06/20/2009  . CAD 06/20/2009    A.  08/2000 - s/p CABG x 4 - LIMA-LAD, Left Radial-OM, VG-DIAG, VG-RCA;  B. Neg. MV  2010  . HYPERLIPIDEMIA 06/20/2009  . HYPERTENSION 06/20/2009  . Hypothyroidism   . Low back pain   . Asthma     as child  . Pneumonia   . Persistent atrial fibrillation     Not felt to be coumadin candidate 2/2 ETOH use.  Marland Kitchen History of tobacco abuse  remote - quit 1970  . Morbidly obese   . Chronic diastolic heart failure, NYHA class 3     Followed by CHF Clinic --> Echo 04/2014: EF 24-09%, Diastolic DysFxn - elevated LVEDP & LAP. Mod Dil LA & RA.  . Falls frequently   . Hx of cardiovascular stress test 05/2014    Lexiscan Myoview (8/15):  No ischemia; EF 63% - Normal Study  . Breast cancer, right breast     . Gout   . OBSTRUCTIVE SLEEP APNEA 06/20/2009    does not use cpap  . Stroke     found on MRI in Oct. 2015.   Marland Kitchen Shortness of breath dyspnea     occasional  . Depression     takes Prozac  . GERD (gastroesophageal reflux disease)     takes Prilosec  . Arthritis   . Neuromuscular disorder     neuropathy in both legs  . Constipation   . ETOH abuse     no alcohol since Oct. 2015  . 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 Bilateral renal cysts   . Kidney disease     Stage 3  . CHF (congestive heart failure)   . Supplemental oxygen dependent     hs   Past Surgical History  Procedure Laterality Date  . Carotid endarterectomy  2002    left  . Coronary artery bypass graft      x 4 - 2001  . Umbilical hernia repair N/A 01/28/2014    Procedure: HERNIA REPAIR UMBILICAL ADULT/INC;  Surgeon: Harl Bowie, MD;  Location: [place];  Service: General;  Laterality: N/A;  . Laparotomy N/A 01/28/2014    Procedure: EXPLORATORY LAPAROTOMY;  Surgeon: Harl Bowie, MD;  Location: [place];  Service: General;  Laterality: N/A;  . Bowel resection N/A 01/28/2014    Procedure: SMALL BOWEL RESECTION;  Surgeon: Harl Bowie, MD;  Location: [place];  Service: General;  Laterality: N/A;  . Hernia repair    . Colonoscopy    . Total mastectomy Right 11/11/2014    Procedure: RIGHT TOTAL MASTECTOMY;  Surgeon: Excell Seltzer, MD;  Location: [place];  Service: General;  Laterality: Right;  . Cataract extraction w/phaco Left 04/08/2015    Procedure: CATARACT EXTRACTION PHACO AND INTRAOCULAR LENS PLACEMENT ([place]);  Surgeon: Birder Robson, MD;  Location: [facility];  Service: Ophthalmology;  Laterality: Left;  [place] 00:36 AP% 23.7 CDE 8.72 fluid pack [id number] H    FAMHx: Family History  Problem Relation Age of Onset  . Stroke Mother     ?  Marland Kitchen Cancer Mother     unknown type of cancer  . Stroke Father     ?  Marland Kitchen Heart attack Neg Hx   . Cancer  Brother 26    stomach cancer    SOCHx:  reports that he quit smoking about 46 years ago. He has never used smokeless tobacco. He reports that he drinks alcohol. He reports that he uses illicit drugs (Marijuana).  ALLERGIES: Allergies  Allergen Reactions  . Erythromycin Hives    ROS: Pertinent items are noted in HPI.  HOME MEDICATIONS: Prescriptions prior to admission  Medication Sig Dispense Refill Last Dose  . allopurinol (ZYLOPRIM) 300 MG tablet Take 1 tablet (300 mg total) by mouth daily. 30 tablet 3 05/25/2015  . aspirin EC 325 MG EC tablet Take 1 tablet (325 mg total) by mouth daily. 30 tablet 0 05/25/2015  .  atorvastatin (LIPITOR) 20 MG tablet Take 1 tablet (20 mg total) by mouth daily. 30 tablet 1 05/25/2015  . carvedilol (COREG) 3.125 MG tablet Take 1 tablet (3.125 mg total) by mouth 2 (two) times daily with a meal. 60 tablet 5 05/25/2015 at 2000  . colchicine 0.6 MG tablet Take 0.6 mg by mouth daily.   05/25/2015  . doxycycline (VIBRAMYCIN) 100 MG capsule Take 100 mg by mouth 2 (two) times daily. Started 8/6 #21  0 05/25/2015  . FLUoxetine (PROZAC) 20 MG capsule Take 1 capsule (20 mg total) by mouth daily. 30 capsule 3 05/25/2015  . fluticasone (FLOVENT HFA) 110 MCG/ACT inhaler Inhale 2 puffs into the lungs 2 (two) times daily.   1/60/7371  . folic acid (FOLVITE) 1 MG tablet Take 1 tablet (1 mg total) by mouth daily. 30 tablet 1 05/25/2015  . furosemide (LASIX) 80 MG tablet TAKE 2 TABLETS IN THE AM AND 1 IN THE PM 90 tablet 5 05/25/2015  . gabapentin (NEURONTIN) 300 MG capsule Take 1 capsule (300 mg total) by mouth 2 (two) times daily. 60 capsule 1 05/25/2015  . insulin glargine (LANTUS) 100 UNIT/ML injection Inject 14 Units into the skin at bedtime.   05/25/2015  . KLOR-CON M20 20 MEQ tablet TAKE 3 TABLETS (60 MEQ TOTAL) BY MOUTH DAILY. 90 tablet 6 05/25/2015  . levothyroxine (SYNTHROID, LEVOTHROID) 125 MCG tablet Take 1.5 tablets (187 mcg total) by mouth daily before breakfast. 30  tablet 3 05/25/2015  . metolazone (ZAROXOLYN) 5 MG tablet Take 5 mg by mouth every Monday, Wednesday, and Friday.   05/23/2015  . omeprazole (PRILOSEC) 20 MG capsule Take 1 capsule (20 mg total) by mouth daily. 30 capsule 1 05/25/2015  . Oxycodone HCl 10 MG TABS Take 1 tablet (10 mg total) by mouth every 6 (six) hours as needed. 40 tablet 0 05/25/2015  . tamoxifen (NOLVADEX) 20 MG tablet Take 1 tablet (20 mg total) by mouth daily. 90 tablet 3 05/25/2015  . venlafaxine XR (EFFEXOR-XR) 37.5 MG 24 hr capsule TAKE ONE CAPSULE BY MOUTH DAILY 30 capsule 3 05/25/2015    HOSPITAL MEDICATIONS: I have reviewed the patient's current medications.  VITALS: Blood pressure 149/63, pulse 80, temperature 98.2 F (36.8 C), temperature source Oral, resp. rate 18, height 5\' 8"  (1.727 m), weight 233 lb 5.4 oz (105.841 kg), SpO2 100 %.  INPUT/OUTPUT I/O last 3 completed shifts: In: 2858.8 [P.O.:2370; I.V.:488.8] Out: 3825 [Urine:3750; Stool:75]      PHYSICAL EXAM: General appearance: alert and no distress Neck: no adenopathy, no carotid bruit, no JVD, supple, symmetrical, trachea midline and thyroid not enlarged, symmetric, no tenderness/mass/nodules Lungs: clear to auscultation bilaterally Heart: irregularly irregular rhythm Extremities: extremities normal, atraumatic, no cyanosis or edema  LABS:  BMP  Recent Labs  05/27/15 0401 05/27/15 1410 05/28/15 0340  NA 137 137 136  K 2.8* 3.4* 3.7  CL 95* 97* 96*  CO2 30 32 31  GLUCOSE 181* 155* 132*  BUN 52* 47* 44*  CREATININE 1.07 0.99 0.90  CALCIUM 8.1* 8.3* 8.9  GFRNONAA >60 >60 >60  GFRAA >60 >60 >60    CBC  Recent Labs Lab 05/26/15 1610  WBC 12.4*  RBC 4.26  HGB 13.5  HCT 41.2  PLT 148*  MCV 96.7    HEMOGLOBIN A1C Lab Results  Component Value Date   HGBA1C 7.1* 08/31/2014   MPG 157* 08/31/2014    Cardiac Panel (last 3 results)  Recent Labs  10/03/14 0009 10/03/14 0400 10/03/14 1050  TROPONINI <  0.03 0.03 0.03    BNP  (last 3 results)  Recent Labs  06/11/14 1624 07/21/14 1300  PROBNP 4440.0* 3542.0*    TSH  Recent Labs  07/22/14 1116 08/31/14 0540 10/03/14 0400  TSH 5.740* 0.986 1.941    CHOLESTEROL  Recent Labs  08/02/14 0524  CHOL 100    Hepatic Function Panel  Recent Labs  10/03/14 0400 10/22/14 1407 11/11/14 0827  PROT 6.3 6.6 7.1  ALBUMIN 2.9* 3.2* 3.4*  AST 23 16 18   ALT 14 6 12   ALKPHOS 60 68 61  BILITOT 0.9 0.46 0.6    IMAGING: Dg Chest 2 View  05/26/2015   CLINICAL DATA:  Acute onset of generalized weakness, fever and increased urinary frequency. Initial encounter.  EXAM: CHEST  2 VIEW  COMPARISON:  Chest radiograph from 10/02/2014  FINDINGS: The lungs are mildly hypoexpanded. Vascular crowding and vascular congestion are seen. There is no evidence of pleural effusion or pneumothorax.  The heart is normal in size. The patient is status post median sternotomy. No acute osseous abnormalities are seen.  IMPRESSION: Lungs mildly hypoexpanded, with underlying vascular congestion. No definite focal airspace consolidation seen.   Electronically Signed   By: Garald Balding M.D.   On: 05/26/2015 19:39   Ct Head Wo Contrast  05/26/2015   CLINICAL DATA:  Acute onset of fever and weakness. Initial encounter.  EXAM: CT HEAD WITHOUT CONTRAST  TECHNIQUE: Contiguous axial images were obtained from the base of the skull through the vertex without intravenous contrast.  COMPARISON:  CT of the head performed 08/01/2014  FINDINGS: There is no evidence of acute infarction, mass lesion, or intra- or extra-axial hemorrhage on CT.  Prominence of the ventricles and sulci reflects moderate cortical volume loss. A small chronic infarct is noted at the high left frontoparietal region. Scattered periventricular and subcortical white matter change likely reflects small vessel ischemic microangiopathy. Mild cerebellar atrophy is noted, with a chronic infarct at the inferior left cerebellar hemisphere.   The brainstem and fourth ventricle are within normal limits. The basal ganglia are unremarkable in appearance. No mass effect or midline shift is seen.  There is no evidence of fracture; visualized osseous structures are unremarkable in appearance. The orbits are within normal limits. A small mucus retention cyst or polyp is noted at the right maxillary sinus. The remaining paranasal sinuses and mastoid air cells are well-aerated. No significant soft tissue abnormalities are seen.  IMPRESSION: 1. No acute intracranial pathology seen on CT. 2. Moderate cortical volume loss and scattered small vessel ischemic microangiopathy. 3. Small chronic infarct at the high left frontoparietal region. Mild cerebellar atrophy, with chronic infarct at the inferior left cerebellar hemisphere. 4. Small mucus retention cyst or polyp at the right maxillary sinus.   Electronically Signed   By: Garald Balding M.D.   On: 05/26/2015 20:53   Dg Knee Complete 4 Views Left  05/26/2015   CLINICAL DATA:  Left knee pain/swelling  EXAM: LEFT KNEE - COMPLETE 4+ VIEW  COMPARISON:  03/05/2014  FINDINGS: No fracture or dislocation is seen.  Mild tricompartmental degenerative changes.  Moderate suprapatellar knee joint effusion.  IMPRESSION: No fracture or dislocation is seen.  Mild degenerative changes.  Moderate suprapatellar knee joint effusion.   Electronically Signed   By: Julian Hy M.D.   On: 05/26/2015 21:15   Tele: atrial fibrillation with a ventricular response of 90  EKG: Atrial fibrillation with a ventricular response of 91. I personally reviewed this EKG  IMPRESSION: 1.  Coronary artery disease: Status post coronary artery bypass grafting in 2001, negative Myoview 05/30/14 with normal LV function by echo 04/21/14. No active signs or symptoms of ischemia 2. Diastolic dysfunction: EF of 55% with elevated filling pressures by 2-D echo 04/21/14. He is on high-dose diabetics at home including Lasix 160 mg the morning, 80 in the  evening, metolazone 5 mg 3 times a week and potassium repletion. 3. Chronic A. Fib: Rate controlled and not on oral" because of ethanol abuse.   RECOMMENDATION: 1. The patient appears to be euvolemic. Chest x-ray shows no evidence of CHF. His renal function is normal. His potassium is repleted from 2.7 up to 3.7. He is currently getting IV magnesium sulfate for a mag of 1.4 which may be a result of his ethanol intake. He admits to having run out of his potassium repletion as an outpatient. He is currently on furosemide 80 mg by mouth twice a day which is less than his home dose but probably appropriate at this time since and this controlled environment he appears to be euvolemic. I believe the major issue will be home health care and access to appropriate medications and diet. He is a DO NOT RESUSCITATE. Skilled nursing facility may be a better option for him with regards to recurrent admissions. At this point, I think he is stable to be discharged with close outpatient follow-up in our clinic.  Time Spent Directly with Patient: 45 minutes  Quay Burow 05/28/2015, 10:40 AM

## 2015-05-28 NOTE — Consult Note (Addendum)
CCS now following for assessment and plan of care to abd fistula, plans for surgery next week.  Pouch intact with good seal to abd full thickness wound, mod amt thick tan drainage in pouch.  If pouch begins to leak, then dry gauze can be re-applied and changed by staff nurses. Please re-consult if further assistance is needed.  Thank-you,  Julien Girt MSN, [place], [place], [place], [place]

## 2015-05-28 NOTE — Progress Notes (Signed)
Patient's hospital status is Observation- per Medicare guidelines, patient cannot be approved for SNF unless patient qualifies for inpatient status. Talked to daughter [name] about [place] choices, [name] requested [facility] for home health care choices. CM also talked to SW Butch Penny about physical therapy's recommendation, SW to follow up.  TCT [facility] with [facility], patient is active with the patient and will resume their services at discharge. Mindi Slicker [facility] [phone]

## 2015-05-28 NOTE — Progress Notes (Signed)
Synovial fluid shows gout.  Treatment per hospitalist.  May follow up as outpatient for steroid injection if patient continues to be symptomatic.

## 2015-05-28 NOTE — Progress Notes (Signed)
Physical Therapy Treatment Patient Details Name: [name] MRN: [id number] DOB: [birth year] Today's Date: 05/28/2015    History of Present Illness [name] is a 69 y.o. male with hx of DM/ CAD/ CABG, DM2, HTN, gout, HL, etoh abuse, afib. 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 In Oct '15 had gait dysfunction and cerebellar CVA. Hx etoh abuse. In Feb '16 had R mastectomy for male breast cancer. Presenting 8/15 with generalized weakness to ED. Work up showed hypokalemia and low Mg    PT Comments    Patient required +2 min/mod assist to transfer to recliner. Patient is not safe to return home at this time however understand that patient may not qualify for SNF for ongoing therapy. Patients family was not present during session but I did speak with Hassan Rowan, [facility] after the session to update her. Will recommend SNF at this time. Please page with further questions.   Follow Up Recommendations  SNF     Equipment Recommendations  None recommended by PT    Recommendations for Other Services       Precautions / Restrictions Precautions Precautions: Fall Precaution Comments: sore left knee Restrictions Weight Bearing Restrictions: No    Mobility  Bed Mobility Overal bed mobility: Needs Assistance Bed Mobility: Supine to Sit     Supine to sit: Min assist     General bed mobility comments: Patient with heavy use of rails. min A for trunk support into sitting and to ensure balance to EOB  Transfers Overall transfer level: Needs assistance Equipment used: Rolling walker (2 wheeled) Transfers: Stand Pivot Transfers Sit to Stand: +2 physical assistance;Mod assist Stand pivot transfers: +2 physical assistance;Min assist       General transfer comment: +2 mod A to power up into standing. patient with heavy posterior lean requiring A to shift weight anteriorly into upright posture. Patient able to use RW (with max cues for  positioning) to transfer to recliner taking shuffling pivot steps  Ambulation/Gait         Gait velocity: unable       Stairs            Wheelchair Mobility    Modified Rankin (Stroke Patients Only)       Balance     Sitting balance-Leahy Scale: Fair       Standing balance-Leahy Scale: Poor                      Cognition Arousal/Alertness: Awake/alert Behavior During Therapy: WFL for tasks assessed/performed Overall Cognitive Status: Impaired/Different from baseline Area of Impairment: Orientation;Safety/judgement;Problem solving Orientation Level: Disoriented to;Time;Situation   Memory: Decreased short-term memory   Safety/Judgement: Decreased awareness of safety;Decreased awareness of deficits     General Comments: Patient stated that he had balance deficits but stated he will be "fine in a day or to don't worry about me"    Exercises      General Comments        Pertinent Vitals/Pain Faces Pain Scale: Hurts little more Pain Location: Left knee with movement Pain Descriptors / Indicators: Aching;Sore Pain Intervention(s): Monitored during session    Home Living                      Prior Function            PT Goals (current goals can now be found in the care plan section) Progress towards PT goals: Progressing  toward goals    Frequency  Min 3X/week    PT Plan Discharge plan needs to be updated    Co-evaluation             End of Session   Activity Tolerance: Patient limited by fatigue Patient left: in chair;with call bell/phone within reach;with chair alarm set     Time: 1445-1512 PT Time Calculation (min) (ACUTE ONLY): 27 min  Charges:  $Therapeutic Activity: 23-37 mins                    G Codes:      Jacqualyn Posey 05/28/2015, 3:25 PM 05/28/2015 Jacqualyn Posey PTA [phone] pager [phone] office

## 2015-05-28 NOTE — Progress Notes (Signed)
CSW notified of PT's recommendation for SNF placement.  As noted by [facility]- patient is Observation status and will not have a qualifying 3 day hospital stay for Medicare coverage. CSW spoke multiple times with patient's daughter Darrold Junker per patient's request.  Daughter and her husband live with patient and provide for his care.  Discussed possible private pay at a SNF for short term SNF but she stated he does not have money to pay for a SNF.  Daughter states he is scheduled for surgery on Monday for repair of an EC Fistula and she is very worried about what the plans going to be for surgery as she as not spoken to an MD. Patient was supposed to have pre op work up today at 12:00 but was in the hospital.  CSW spoke to [place] for clarification and she advised that daughter contact Dr. Trevor Mace office and leave message for his RN to call back for further discussion. Daughter stated that she would do so.  Daughter plans to take patient home with [facility] arranged by [facility] if/when d/c is indicated by MD.   She feels she can take patient home by car at d/c but is somewhat worried as there are steps to navigate.  CSW discussed with Dr Sloan Leiter who stated that patient's knee- which is causing a great deal of pain- will be "tapped" today and this will hopefully improve ambulation.  CSW can offer EMS transport home if he cannot manage by car.  Lorie Phenix. Pauline Good, [place]

## 2015-05-28 NOTE — Consult Note (Signed)
ORTHOPAEDIC CONSULTATION  REQUESTING PHYSICIAN: Jonetta Osgood, MD  Chief Complaint: left knee effusion  HPI: [name] is a 69 y.o. male who presents with left knee effusion and pain.  Fell 2 weeks ago.  Has had gradually worsening pain.  Denies f/c.  Patient is an alcoholic and has gout which he is on allopurinol for.  Pain is moderate in the knee, worse with weight bearing and bending.  Pain does not radiate.   Past Medical History  Diagnosis Date  . CHRONIC OBSTRUCTIVE PULMONARY DISEASE 06/20/2009  . CAROTID STENOSIS 06/20/2009    A. 08/2001 s/p L CEA;  B.   09/14/11 - Carotid U/S - 40-59% bilateral stenosis, left CEA patch angioplasty is patent  . DM 06/20/2009  . CAD 06/20/2009    A.  08/2000 - s/p CABG x 4 - LIMA-LAD, Left Radial-OM, VG-DIAG, VG-RCA;  B. Neg. MV  2010  . HYPERLIPIDEMIA 06/20/2009  . HYPERTENSION 06/20/2009  . Hypothyroidism   . Low back pain   . Asthma     as child  . Pneumonia   . Persistent atrial fibrillation     Not felt to be coumadin candidate 2/2 ETOH use.  Marland Kitchen History of tobacco abuse     remote - quit 1970  . Morbidly obese   . Chronic diastolic heart failure, NYHA class 3     Followed by [facility] --> Echo 04/2014: EF 67-59%, Diastolic DysFxn - elevated LVEDP & LAP. Mod Dil LA & RA.  . Falls frequently   . Hx of cardiovascular stress test 05/2014    Lexiscan Myoview (8/15):  No ischemia; EF 63% - Normal Study  . Breast cancer, right breast   . Gout   . OBSTRUCTIVE SLEEP APNEA 06/20/2009    does not use cpap  . Stroke     found on MRI in Oct. 2015.   Marland Kitchen Shortness of breath dyspnea     occasional  . Depression     takes Prozac  . GERD (gastroesophageal reflux disease)     takes Prilosec  . Arthritis   . Neuromuscular disorder     neuropathy in both legs  . Constipation   . ETOH abuse     no alcohol since Oct. 2015  . 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 Bilateral renal cysts     . Kidney disease     Stage 3  . CHF (congestive heart failure)   . Supplemental oxygen dependent     hs   Past Surgical History  Procedure Laterality Date  . Carotid endarterectomy  2002    left  . Coronary artery bypass graft      x 4 - 2001  . Umbilical hernia repair N/A 01/28/2014    Procedure: HERNIA REPAIR UMBILICAL ADULT/INC;  Surgeon: Harl Bowie, MD;  Location: [facility];  Service: General;  Laterality: N/A;  . Laparotomy N/A 01/28/2014    Procedure: EXPLORATORY LAPAROTOMY;  Surgeon: Harl Bowie, MD;  Location: [facility];  Service: General;  Laterality: N/A;  . Bowel resection N/A 01/28/2014    Procedure: SMALL BOWEL RESECTION;  Surgeon: Harl Bowie, MD;  Location: [facility];  Service: General;  Laterality: N/A;  . Hernia repair    . Colonoscopy    . Total mastectomy Right 11/11/2014    Procedure: RIGHT TOTAL MASTECTOMY;  Surgeon: Excell Seltzer, MD;  Location: [facility];  Service: General;  Laterality: Right;  . Cataract extraction w/phaco Left 04/08/2015    Procedure: CATARACT EXTRACTION PHACO AND INTRAOCULAR LENS PLACEMENT (IOC);  Surgeon: Birder Robson, MD;  Location: [facility];  Service: Ophthalmology;  Laterality: Left;  [place] 00:36 AP% 23.7 CDE 8.72 fluid pack DUK#[id number] H   Social History   Social History  . Marital Status: Single    Spouse Name: N/A  . Number of Children: N/A  . Years of Education: N/A   Occupational History  . retired    Social History Main Topics  . Smoking status: Former Smoker -- 1.00 packs/day for 16 years    Quit date: 10/11/1968  . Smokeless tobacco: Never Used  . Alcohol Use: Yes     Comment: last drink was 2 weeks ago.  . Drug Use: Yes    Special: Marijuana     Comment: last 1 year ago  . Sexual Activity: No   Other Topics Concern  . None   Social History Narrative   Lives in [place] with dtr, son-in-law   Family History  Problem Relation Age of Onset  . Stroke Mother     ?  Marland Kitchen Cancer Mother     unknown type  of cancer  . Stroke Father     ?  Marland Kitchen Heart attack Neg Hx   . Cancer Brother 72    stomach cancer   Allergies  Allergen Reactions  . Erythromycin Hives   Prior to Admission medications   Medication Sig Start Date End Date Taking? Authorizing Provider  allopurinol (ZYLOPRIM) 300 MG tablet Take 1 tablet (300 mg total) by mouth daily. 08/02/14  Yes Daniel J Angiulli, PA-C  aspirin EC 325 MG EC tablet Take 1 tablet (325 mg total) by mouth daily. 08/02/14  Yes Daniel J Angiulli, PA-C  atorvastatin (LIPITOR) 20 MG tablet Take 1 tablet (20 mg total) by mouth daily. 08/02/14  Yes Daniel J Angiulli, PA-C  carvedilol (COREG) 3.125 MG tablet Take 1 tablet (3.125 mg total) by mouth 2 (two) times daily with a meal. 08/02/14  Yes Daniel J Angiulli, PA-C  colchicine 0.6 MG tablet Take 0.6 mg by mouth daily. 01/13/15  Yes Historical Provider, MD  doxycycline (VIBRAMYCIN) 100 MG capsule Take 100 mg by mouth 2 (two) times daily. Started 8/6 #21 05/16/15  Yes Historical Provider, MD  FLUoxetine (PROZAC) 20 MG capsule Take 1 capsule (20 mg total) by mouth daily. 08/02/14  Yes Daniel J Angiulli, PA-C  fluticasone (FLOVENT HFA) 110 MCG/ACT inhaler Inhale 2 puffs into the lungs 2 (two) times daily.   Yes Historical Provider, MD  folic acid (FOLVITE) 1 MG tablet Take 1 tablet (1 mg total) by mouth daily. 08/02/14  Yes Daniel J Angiulli, PA-C  furosemide (LASIX) 80 MG tablet TAKE 2 TABLETS IN THE AM AND 1 IN THE PM 04/09/15  Yes Burnell Blanks, MD  gabapentin (NEURONTIN) 300 MG capsule Take 1 capsule (300 mg total) by mouth 2 (two) times daily. 08/02/14  Yes Daniel J Angiulli, PA-C  insulin glargine (LANTUS) 100 UNIT/ML injection Inject 14 Units into the skin at bedtime.   Yes Historical Provider, MD  KLOR-CON M20 20 MEQ tablet TAKE 3 TABLETS (60 MEQ TOTAL) BY MOUTH DAILY. 04/24/15  Yes Burnell Blanks, MD  levothyroxine (SYNTHROID, LEVOTHROID) 125 MCG tablet Take 1.5 tablets (187 mcg total) by mouth daily  before breakfast. 08/02/14  Yes Lavon Paganini Angiulli, PA-C  metolazone (ZAROXOLYN) 5 MG tablet Take 5 mg by mouth every Monday, Wednesday, and Friday.  01/24/15  Yes Historical Provider, MD  omeprazole (PRILOSEC) 20 MG capsule Take 1 capsule (20 mg total) by mouth daily. 08/02/14  Yes Daniel J Angiulli, PA-C  Oxycodone HCl 10 MG TABS Take 1 tablet (10 mg total) by mouth every 6 (six) hours as needed. 11/12/14  Yes Excell Seltzer, MD  tamoxifen (NOLVADEX) 20 MG tablet Take 1 tablet (20 mg total) by mouth daily. 09/09/14  Yes Nicholas Lose, MD  venlafaxine XR (EFFEXOR-XR) 37.5 MG 24 hr capsule TAKE ONE CAPSULE BY MOUTH DAILY 02/24/15  Yes Nicholas Lose, MD   Dg Chest 2 View  05/26/2015   CLINICAL DATA:  Acute onset of generalized weakness, fever and increased urinary frequency. Initial encounter.  EXAM: CHEST  2 VIEW  COMPARISON:  Chest radiograph from 10/02/2014  FINDINGS: The lungs are mildly hypoexpanded. Vascular crowding and vascular congestion are seen. There is no evidence of pleural effusion or pneumothorax.  The heart is normal in size. The patient is status post median sternotomy. No acute osseous abnormalities are seen.  IMPRESSION: Lungs mildly hypoexpanded, with underlying vascular congestion. No definite focal airspace consolidation seen.   Electronically Signed   By: Garald Balding M.D.   On: 05/26/2015 19:39   Ct Head Wo Contrast  05/26/2015   CLINICAL DATA:  Acute onset of fever and weakness. Initial encounter.  EXAM: CT HEAD WITHOUT CONTRAST  TECHNIQUE: Contiguous axial images were obtained from the base of the skull through the vertex without intravenous contrast.  COMPARISON:  CT of the head performed 08/01/2014  FINDINGS: There is no evidence of acute infarction, mass lesion, or intra- or extra-axial hemorrhage on CT.  Prominence of the ventricles and sulci reflects moderate cortical volume loss. A small chronic infarct is noted at the high left frontoparietal region. Scattered periventricular  and subcortical white matter change likely reflects small vessel ischemic microangiopathy. Mild cerebellar atrophy is noted, with a chronic infarct at the inferior left cerebellar hemisphere.  The brainstem and fourth ventricle are within normal limits. The basal ganglia are unremarkable in appearance. No mass effect or midline shift is seen.  There is no evidence of fracture; visualized osseous structures are unremarkable in appearance. The orbits are within normal limits. A small mucus retention cyst or polyp is noted at the right maxillary sinus. The remaining paranasal sinuses and mastoid air cells are well-aerated. No significant soft tissue abnormalities are seen.  IMPRESSION: 1. No acute intracranial pathology seen on CT. 2. Moderate cortical volume loss and scattered small vessel ischemic microangiopathy. 3. Small chronic infarct at the high left frontoparietal region. Mild cerebellar atrophy, with chronic infarct at the inferior left cerebellar hemisphere. 4. Small mucus retention cyst or polyp at the right maxillary sinus.   Electronically Signed   By: Garald Balding M.D.   On: 05/26/2015 20:53   Dg Knee Complete 4 Views Left  05/26/2015   CLINICAL DATA:  Left knee pain/swelling  EXAM: LEFT KNEE - COMPLETE 4+ VIEW  COMPARISON:  03/05/2014  FINDINGS: No fracture or dislocation is seen.  Mild tricompartmental degenerative changes.  Moderate suprapatellar knee joint effusion.  IMPRESSION: No fracture or dislocation is seen.  Mild degenerative changes.  Moderate suprapatellar knee joint effusion.   Electronically Signed   By: Julian Hy M.D.   On: 05/26/2015 21:15    Positive ROS: All other systems have been reviewed and were otherwise negative with the exception of those mentioned in the HPI and as above.  Physical Exam: General: Alert, no acute distress Cardiovascular:  No pedal edema Respiratory: No cyanosis, no use of accessory musculature GI: No organomegaly, abdomen is soft and  non-tender Skin: No lesions in the area of chief complaint Neurologic: Sensation intact distally Psychiatric: Patient is competent for consent with normal mood and affect Lymphatic: No axillary or cervical lymphadenopathy  MUSCULOSKELETAL:  - small knee effusion - no signs of infection, cellulitis, erythema, warmth - able to bend knee to 90 degrees without much pain  Assessment: Left knee effusion, probable gout  Plan: - bedside aspiration performed, fluid grossly appears to be gout - fluid sent to lab for cell count and cultures - could consider steroid injection as outpatient - patient does not appear to be too symptomatic  Thank you for the consult and the opportunity to see [name]. Eduard Roux, MD [place] 3:45 PM

## 2015-05-28 NOTE — Progress Notes (Addendum)
PATIENT DETAILS Name: [name] Age: 69 y.o. Sex: male Date of Birth: 1946-05-10 Admit Date: 05/26/2015 Admitting Physician Roney Jaffe, MD CBJ:SEGBTD,VVOHY, MD  Subjective: Still feels weak-less pain in his left knee area. He apparently fell approximately 2 weeks back.  Assessment/Plan: Principal Problem: Generalized weakness: Likely from severe hypokalemia. No evidence of infection-although gives history of subjective fever-UA and chest x-ray negative.  Follow clinical course. PT evaluation recommending CIR-unfortunately he is observation status-and will not be able to order CIR. Repeat PT evaluation, but per patient-he is unable to walk.  Active Problems: Hypokalemia and hypomagnesemia: Suspect multifactorial from diuretics and alcohol intake. Continue to replete.  Not sure who is managing this patient's high-dose diuretics as an outpatient-poor historian. Have arranged for home health RN to check both BMET and magnesium, and fax results to PCPs office.  Left knee pain: Apparently fell 2 weeks back. Does appear to have some swelling, x-ray of the left knee confirms effusion-would ask orthopedics to evaluate for arthrocentesis-to see if we could send off synovitis fluid analysis to rule out gout. Doubt septic arthritis.  History of chronic atrial fibrillation: Rate controlled with Coreg, reviewed outpatient cardiology note 5/25-not a candidate for anticoagulation because of alcohol consumption.  Chronic diastolic heart failure: Initially because of hypokalemia/dehydration, Lasix was held, have restarted. Appears euvolemic on exam. Have asked cardiology see one time to see if adjustments are required in his diabetic regimen given severity of hypokalemia and hypomagnesemia.   EtOH abuse: No signs of withdrawal, start CIWA protocol with Ativan  History of right breast cancer: Status post mastectomy, continue tamoxifen.Follow with Dr Lindi Adie  History of cerebellar  CVA: Nonfocal exam, continue aspirin/statin. See above regarding anticoagulation  History of CAD: Without chest pain/shortness of breath. 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 Await surgery input.  CKD stage 3: creatinine close to baseline. Has proteinuria, likely secondary to diabetes. Follow  Essential hypertension: Moderate controlled, continue Coreg, restarted Lasix. Follow BP   Chronic debility:PT eval  Diabetes on insulin: CBGs stable, continue with Lantus 14 units along with SSI. Follow.  History of gout: continue allopurinol and colchicine-await arthrocentesis results  Chronic pain syndrome: Continue oxycodone. Place on MiraLAX  Disposition: Remain inpatient-?HHPT vs SNF on discharge  Antimicrobial agents  See below  Anti-infectives    None      DVT Prophylaxis: Prophylactic Lovenox   Code Status: Full code   Family Communication None at bedside-but spoke with daughter over the phone  Procedures: None  CONSULTS:  None  Time spent 25  minutes-Greater than 50% of this time was spent in counseling, explanation of diagnosis, planning of further management, and coordination of care.  MEDICATIONS: Scheduled Meds: . allopurinol  300 mg Oral Daily  . aspirin EC  325 mg Oral Daily  . atorvastatin  20 mg Oral q1800  . budesonide  0.25 mg Nebulization BID  . carvedilol  3.125 mg Oral BID WC  . colchicine  0.6 mg Oral Daily  . enoxaparin (LOVENOX) injection  50 mg Subcutaneous Q24H  . FLUoxetine  20 mg Oral Daily  . folic acid  1 mg Oral Daily  . furosemide  80 mg Oral BID  . gabapentin  300 mg Oral BID  .  insulin aspart  0-15 Units Subcutaneous TID WC  . insulin aspart  0-5 Units Subcutaneous QHS  . insulin glargine  14 Units Subcutaneous QHS  .  multivitamin with minerals  1 tablet Oral Daily  . polyethylene glycol  17 g Oral Daily  . potassium chloride  40 mEq Oral BID  . sodium chloride  3 mL Intravenous Q12H  . tamoxifen  20 mg Oral Daily  . thiamine  100 mg Oral Daily   Or  . thiamine  100 mg Intravenous Daily  . venlafaxine XR  37.5 mg Oral Daily   Continuous Infusions:   PRN Meds:.acetaminophen **OR** acetaminophen, bisacodyl, LORazepam **OR** LORazepam, ondansetron **OR** ondansetron (ZOFRAN) IV, oxyCODONE    PHYSICAL EXAM: Vital signs in last 24 hours: Filed Vitals:   05/27/15 2127 05/28/15 0648 05/28/15 0900 05/28/15 0945  BP: 156/85 149/63 136/78   Pulse: 80 78 91 80  Temp: 98 F (36.7 C) 98.2 F (36.8 C) 97.8 F (36.6 C)   TempSrc: Oral Oral Oral   Resp: 20 18 18 18   Height:      Weight:  105.841 kg (233 lb 5.4 oz)    SpO2: 99% 100% 94%     Weight change: -5.29 kg (-11 lb 10.6 oz) Filed Weights   05/27/15 0033 05/27/15 0521 05/28/15 0648  Weight: 106.232 kg (234 lb 3.2 oz) 105.915 kg (233 lb 8 oz) 105.841 kg (233 lb 5.4 oz)   Body mass index is 35.49 kg/(m^2).   Gen Exam: Awake and alert with clear speech.  Neck: Supple, No JVD.   Chest: B/L Clear.   CVS: S1 S2 Regular, no murmurs.  Abdomen: soft, BS +, non tender, non distended. Open wound w moist dressing in place (fistula),  Extremities: 1+ edema, lower extremities warm to touch. Left knee slightly more swollen than right. Has chronic erythematous changes in bilateral lower extremities. Neurologic: Non Focal.   Skin: No Rash.   Wounds: N/A.   Intake/Output from previous day:  Intake/Output Summary (Last 24 hours) at 05/28/15 1148 Last data filed at 05/28/15 0900  Gross per 24 hour  Intake 1982.17 ml  Output   1600 ml  Net 382.17 ml    LAB RESULTS: CBC  Recent Labs Lab 05/26/15 1610  WBC 12.4*  HGB 13.5  HCT 41.2  PLT 148*  MCV 96.7  MCH 31.7  MCHC 32.8  RDW 15.3    Chemistries   Recent Labs Lab 05/26/15 1610  05/26/15 1852 05/27/15 0401 05/27/15 1410 05/28/15 0340  NA 134*  --  137 137 136  K 2.7*  --  2.8* 3.4* 3.7  CL 89*  --  95* 97* 96*  CO2 30  --  30 32 31  GLUCOSE 176*  --  181* 155* 132*  BUN 56*  --  52* 47* 44*  CREATININE 1.26*  --  1.07 0.99 0.90  CALCIUM 8.3*  --  8.1* 8.3* 8.9  MG  --  0.9*  --  1.3* 1.4*    CBG:  Recent Labs Lab 05/27/15 1136 05/27/15 1622 05/27/15 2201 05/28/15 0628 05/28/15 1120  GLUCAP 157* 151* 136* 117* 158*    GFR Estimated Creatinine Clearance: 88.8 mL/min (by C-G formula based on Cr of 0.9).  Coagulation profile No results for input(s): INR, PROTIME in the last 168 hours.  Cardiac Enzymes No results for input(s): CKMB, TROPONINI, MYOGLOBIN in the last 168 hours.  Invalid input(s): CK  Invalid input(s): POCBNP No results for input(s): DDIMER in the  last 72 hours. No results for input(s): HGBA1C in the last 72 hours. No results for input(s): CHOL, HDL, LDLCALC, TRIG, CHOLHDL, LDLDIRECT in the last 72 hours. No results for input(s): TSH, T4TOTAL, T3FREE, THYROIDAB in the last 72 hours.  Invalid input(s): FREET3 No results for input(s): VITAMINB12, FOLATE, FERRITIN, TIBC, IRON, RETICCTPCT in the last 72 hours. No results for input(s): LIPASE, AMYLASE in the last 72 hours.  Urine Studies No results for input(s): UHGB, CRYS in the last 72 hours.  Invalid input(s): UACOL, UAPR, USPG, UPH, UTP, UGL, UKET, UBIL, UNIT, UROB, ULEU, UEPI, UWBC, URBC, UBAC, CAST, UCOM, BILUA  MICROBIOLOGY: No results found for this or any previous visit (from the past 240 hour(s)).  RADIOLOGY STUDIES/RESULTS: Dg Chest 2 View  05/26/2015   CLINICAL DATA:  Acute onset of generalized weakness, fever and increased urinary frequency. Initial encounter.  EXAM: CHEST  2 VIEW  COMPARISON:  Chest radiograph from 10/02/2014  FINDINGS: The lungs are mildly hypoexpanded. Vascular crowding and vascular congestion are seen. There is no evidence of pleural effusion or  pneumothorax.  The heart is normal in size. The patient is status post median sternotomy. No acute osseous abnormalities are seen.  IMPRESSION: Lungs mildly hypoexpanded, with underlying vascular congestion. No definite focal airspace consolidation seen.   Electronically Signed   By: Garald Balding M.D.   On: 05/26/2015 19:39   Ct Head Wo Contrast  05/26/2015   CLINICAL DATA:  Acute onset of fever and weakness. Initial encounter.  EXAM: CT HEAD WITHOUT CONTRAST  TECHNIQUE: Contiguous axial images were obtained from the base of the skull through the vertex without intravenous contrast.  COMPARISON:  CT of the head performed 08/01/2014  FINDINGS: There is no evidence of acute infarction, mass lesion, or intra- or extra-axial hemorrhage on CT.  Prominence of the ventricles and sulci reflects moderate cortical volume loss. A small chronic infarct is noted at the high left frontoparietal region. Scattered periventricular and subcortical white matter change likely reflects small vessel ischemic microangiopathy. Mild cerebellar atrophy is noted, with a chronic infarct at the inferior left cerebellar hemisphere.  The brainstem and fourth ventricle are within normal limits. The basal ganglia are unremarkable in appearance. No mass effect or midline shift is seen.  There is no evidence of fracture; visualized osseous structures are unremarkable in appearance. The orbits are within normal limits. A small mucus retention cyst or polyp is noted at the right maxillary sinus. The remaining paranasal sinuses and mastoid air cells are well-aerated. No significant soft tissue abnormalities are seen.  IMPRESSION: 1. No acute intracranial pathology seen on CT. 2. Moderate cortical volume loss and scattered small vessel ischemic microangiopathy. 3. Small chronic infarct at the high left frontoparietal region. Mild cerebellar atrophy, with chronic infarct at the inferior left cerebellar hemisphere. 4. Small mucus retention cyst or  polyp at the right maxillary sinus.   Electronically Signed   By: Garald Balding M.D.   On: 05/26/2015 20:53   Dg Knee Complete 4 Views Left  05/26/2015   CLINICAL DATA:  Left knee pain/swelling  EXAM: LEFT KNEE - COMPLETE 4+ VIEW  COMPARISON:  03/05/2014  FINDINGS: No fracture or dislocation is seen.  Mild tricompartmental degenerative changes.  Moderate suprapatellar knee joint effusion.  IMPRESSION: No fracture or dislocation is seen.  Mild degenerative changes.  Moderate suprapatellar knee joint effusion.   Electronically Signed   By: Julian Hy M.D.   On: 05/26/2015 21:15    Oren Binet, MD  [facility]  Pager:[phone]  If 7PM-7AM, please contact night-coverage [email or web address] Password [facility] 05/28/2015, 11:48 AM

## 2015-05-29 DIAGNOSIS — E876 Hypokalemia: Secondary | ICD-10-CM | POA: Diagnosis not present

## 2015-05-29 DIAGNOSIS — R531 Weakness: Secondary | ICD-10-CM | POA: Diagnosis not present

## 2015-05-29 DIAGNOSIS — F1010 Alcohol abuse, uncomplicated: Secondary | ICD-10-CM | POA: Diagnosis not present

## 2015-05-29 LAB — MAGNESIUM: Magnesium: 1.6 mg/dL — ABNORMAL LOW (ref 1.7–2.4)

## 2015-05-29 LAB — BASIC METABOLIC PANEL
ANION GAP: 9 (ref 5–15)
BUN: 41 mg/dL — AB (ref 6–20)
CALCIUM: 9.3 mg/dL (ref 8.9–10.3)
CO2: 33 mmol/L — ABNORMAL HIGH (ref 22–32)
CREATININE: 0.87 mg/dL (ref 0.61–1.24)
Chloride: 96 mmol/L — ABNORMAL LOW (ref 101–111)
GFR calc Af Amer: >60 mL/min (ref >60)
GLUCOSE: 270 mg/dL — AB (ref 65–99)
Potassium: 4.3 mmol/L (ref 3.5–5.1)
Sodium: 138 mmol/L (ref 135–145)

## 2015-05-29 LAB — GLUCOSE, CAPILLARY
GLUCOSE-CAPILLARY: 276 mg/dL — AB (ref 65–99)
Glucose-Capillary: 189 mg/dL — ABNORMAL HIGH (ref 65–99)

## 2015-05-29 MED ORDER — FUROSEMIDE 80 MG PO TABS: 80.0000 mg | ORAL_TABLET | Freq: Two times a day (BID) | ORAL | Status: DC

## 2015-05-29 MED ORDER — PREDNISONE 20 MG PO TABS: 40.0000 mg | ORAL_TABLET | Freq: Every day | ORAL | Status: DC

## 2015-05-29 MED ORDER — MAGNESIUM OXIDE 400 (241.3 MG) MG PO TABS: 200.0000 mg | ORAL_TABLET | Freq: Two times a day (BID) | ORAL | Status: DC

## 2015-05-29 MED ORDER — POTASSIUM CHLORIDE CRYS ER 20 MEQ PO TBCR: 40.0000 meq | EXTENDED_RELEASE_TABLET | Freq: Two times a day (BID) | ORAL | Status: DC

## 2015-05-29 MED ORDER — MAGNESIUM SULFATE 4 GM/100ML IV SOLN
4.0000 g | Freq: Once | INTRAVENOUS | Status: AC
  Administered 2015-05-29: 4 g via INTRAVENOUS
  Filled 2015-05-29: qty 100

## 2015-05-29 NOTE — Progress Notes (Signed)
Physical Therapy Treatment Patient Details Name: [name] MRN: [id number] DOB: 23-Oct-1945 Today's Date: 05/29/2015    History of Present Illness [name] is a 69 y.o. male with hx of DM/ CAD/ CABG, DM2, HTN, gout, HL, etoh abuse, afib. 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 In Oct '15 had gait dysfunction and cerebellar CVA. Hx etoh abuse. In Feb '16 had R mastectomy for male breast cancer. Presenting 8/15 with generalized weakness to ED. Work up showed hypokalemia and low Mg    PT Comments    Pt moving exceptionally well compared to eval without report of knee pain today. Pt much more alert and able to perform all transfers and mobility with min assist or less. Pt should safely be able to D/C home with family assist, HHPT and RW. Will continue to follow to maximize function and mobility.   Follow Up Recommendations  Home health PT     Equipment Recommendations  None recommended by PT    Recommendations for Other Services       Precautions / Restrictions Precautions Precautions: Fall Precaution Comments: decreased DF LLE Restrictions Weight Bearing Restrictions: No    Mobility  Bed Mobility Overal bed mobility: Needs Assistance Bed Mobility: Supine to Sit     Supine to sit: Min guard     General bed mobility comments: heavy use of rail but able to achieve without physical assist  Transfers     Transfers: Sit to/from Stand Sit to Stand: Min assist         General transfer comment: assist for anterior translation with cues for hand placement to sit  Ambulation/Gait Ambulation/Gait assistance: Min guard Ambulation Distance (Feet): 165 Feet Assistive device: Rolling walker (2 wheeled) Gait Pattern/deviations: Step-through pattern;Decreased stride length;Trunk flexed   Gait velocity interpretation: Below normal speed for age/gender General Gait Details: cues for posture, position in RW, pt no longer uses  Left AFO. Cues to self monitor due to fatigue   Stairs            Wheelchair Mobility    Modified Rankin (Stroke Patients Only)       Balance Overall balance assessment: Needs assistance   Sitting balance-Leahy Scale: Fair       Standing balance-Leahy Scale: Poor                      Cognition Arousal/Alertness: Awake/alert Behavior During Therapy: WFL for tasks assessed/performed Overall Cognitive Status: Within Functional Limits for tasks assessed                      Exercises General Exercises - Lower Extremity Long Arc Quad: AROM;Seated;Both;15 reps Hip ABduction/ADduction: AROM;Seated;Both;15 reps Hip Flexion/Marching: AROM;Seated;Both;15 reps Toe Raises: AROM;Seated;Right;15 reps    General Comments        Pertinent Vitals/Pain Pain Assessment: No/denies pain    Home Living                      Prior Function            PT Goals (current goals can now be found in the care plan section) Progress towards PT goals: Goals met and updated - see care plan    Frequency       PT Plan Discharge plan needs to be updated    Co-evaluation             End of Session Equipment Utilized  During Treatment: Gait belt Activity Tolerance: Patient tolerated treatment well Patient left: in chair;with call bell/phone within reach;with chair alarm set;with nursing/sitter in room     Time: 0951-1009 PT Time Calculation (min) (ACUTE ONLY): 18 min  Charges:  $Gait Training: 8-22 mins                    G Codes:      [name] 06/05/2015, 10:45 AM Elwyn Reach, [place]

## 2015-05-29 NOTE — Progress Notes (Signed)
CSW spoke with patient's nurse this afternoon to confirm d/c home via car with daughter/family.  She checked with patient and daughter who confirmed they were ok to transport via car. No further CSW needs identified. CSW signing off.  Lorie Phenix. Pauline Good, [place]

## 2015-05-29 NOTE — Discharge Summary (Signed)
PATIENT DETAILS Name: [name] Age: 69 y.o. Sex: male Date of Birth: 1945-12-12 MRN: [id number]. Admitting Physician: Roney Jaffe, MD PIR:JJOACZ,YSAYT, MD  Admit Date: 05/26/2015 Discharge date: 05/29/2015  Recommendations for Outpatient Follow-up:  1. Please check BMET to weekly and adjust dosing of Lasix/potassium and magnesium supplementation   PRIMARY DISCHARGE DIAGNOSIS:  Principal Problem:   Generalized weakness Active Problems:   ETOH abuse   Debility   Enterocutaneous fistula   Lower extremity pain, left   Diabetes mellitus type 2, controlled   Hypokalemia   Hypomagnesemia   History of male breast cancer, s/p R mastectomy Jan 2016   General weakness      PAST MEDICAL HISTORY: Past Medical History  Diagnosis Date  . CHRONIC OBSTRUCTIVE PULMONARY DISEASE 06/20/2009  . CAROTID STENOSIS 06/20/2009    A. 08/2001 s/p L CEA;  B.   09/14/11 - Carotid U/S - 40-59% bilateral stenosis, left CEA patch angioplasty is patent  . DM 06/20/2009  . CAD 06/20/2009    A.  08/2000 - s/p CABG x 4 - LIMA-LAD, Left Radial-OM, VG-DIAG, VG-RCA;  B. Neg. MV  2010  . HYPERLIPIDEMIA 06/20/2009  . HYPERTENSION 06/20/2009  . Hypothyroidism   . Low back pain   . Asthma     as child  . Pneumonia   . Persistent atrial fibrillation     Not felt to be coumadin candidate 2/2 ETOH use.  Marland Kitchen History of tobacco abuse     remote - quit 1970  . Morbidly obese   . Chronic diastolic heart failure, NYHA class 3     Followed by [facility] --> Echo 04/2014: EF 01-60%, Diastolic DysFxn - elevated LVEDP & LAP. Mod Dil LA & RA.  . Falls frequently   . Hx of cardiovascular stress test 05/2014    Lexiscan Myoview (8/15):  No ischemia; EF 63% - Normal Study  . Breast cancer, right breast   . Gout   . OBSTRUCTIVE SLEEP APNEA 06/20/2009    does not use cpap  . Stroke     found on MRI in Oct. 2015.   Marland Kitchen Shortness of breath dyspnea     occasional  . Depression     takes Prozac  . GERD (gastroesophageal  reflux disease)     takes Prilosec  . Arthritis   . Neuromuscular disorder     neuropathy in both legs  . Constipation   . ETOH abuse     no alcohol since Oct. 2015  . 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 Bilateral renal cysts   . Kidney disease     Stage 3  . CHF (congestive heart failure)   . Supplemental oxygen dependent     hs    DISCHARGE MEDICATIONS: Current Discharge Medication List    START taking these medications   Details  magnesium oxide (MAG-OX) 400 (241.3 MG) MG tablet Take 0.5 tablets (200 mg total) by mouth 2 (two) times daily. Qty: 30 tablet, Refills: 0    predniSONE (DELTASONE) 20 MG tablet Take 2 tablets (40 mg total) by mouth daily with breakfast. Qty: 6 tablet, Refills: 0      CONTINUE these medications which have CHANGED   Details  furosemide (LASIX) 80 MG tablet Take 1 tablet (80 mg total) by mouth 2 (two) times daily. Qty: 90 tablet, Refills: 0    potassium chloride SA (KLOR-CON M20) 20 MEQ tablet Take 2 tablets (40  mEq total) by mouth 2 (two) times daily. Qty: 120 tablet, Refills: 0      CONTINUE these medications which have NOT CHANGED   Details  allopurinol (ZYLOPRIM) 300 MG tablet Take 1 tablet (300 mg total) by mouth daily. Qty: 30 tablet, Refills: 3    aspirin EC 325 MG EC tablet Take 1 tablet (325 mg total) by mouth daily. Qty: 30 tablet, Refills: 0    atorvastatin (LIPITOR) 20 MG tablet Take 1 tablet (20 mg total) by mouth daily. Qty: 30 tablet, Refills: 1    carvedilol (COREG) 3.125 MG tablet Take 1 tablet (3.125 mg total) by mouth 2 (two) times daily with a meal. Qty: 60 tablet, Refills: 5    colchicine 0.6 MG tablet Take 0.6 mg by mouth daily.    FLUoxetine (PROZAC) 20 MG capsule Take 1 capsule (20 mg total) by mouth daily. Qty: 30 capsule, Refills: 3    fluticasone (FLOVENT HFA) 110 MCG/ACT inhaler Inhale 2 puffs into the lungs 2 (two) times daily.    folic acid  (FOLVITE) 1 MG tablet Take 1 tablet (1 mg total) by mouth daily. Qty: 30 tablet, Refills: 1    gabapentin (NEURONTIN) 300 MG capsule Take 1 capsule (300 mg total) by mouth 2 (two) times daily. Qty: 60 capsule, Refills: 1    insulin glargine (LANTUS) 100 UNIT/ML injection Inject 14 Units into the skin at bedtime.    levothyroxine (SYNTHROID, LEVOTHROID) 125 MCG tablet Take 1.5 tablets (187 mcg total) by mouth daily before breakfast. Qty: 30 tablet, Refills: 3    metolazone (ZAROXOLYN) 5 MG tablet Take 5 mg by mouth every Monday, Wednesday, and Friday.    omeprazole (PRILOSEC) 20 MG capsule Take 1 capsule (20 mg total) by mouth daily. Qty: 30 capsule, Refills: 1    Oxycodone HCl 10 MG TABS Take 1 tablet (10 mg total) by mouth every 6 (six) hours as needed. Qty: 40 tablet, Refills: 0    tamoxifen (NOLVADEX) 20 MG tablet Take 1 tablet (20 mg total) by mouth daily. Qty: 90 tablet, Refills: 3   Associated Diagnoses: Cancer of male breast, right    venlafaxine XR (EFFEXOR-XR) 37.5 MG 24 hr capsule TAKE ONE CAPSULE BY MOUTH DAILY Qty: 30 capsule, Refills: 3      STOP taking these medications     doxycycline (VIBRAMYCIN) 100 MG capsule         ALLERGIES:   Allergies  Allergen Reactions  . Erythromycin Hives    BRIEF HPI:  See H&P, Labs, Consult and Test reports for all details in brief, patient is a 69 year old male with history of diastolic heart failure, alcohol abuse-he was brought in for evaluation of generalized weakness. Found to have severe hypokalemia and hypomagnesemia. He was subsequently admitted for further evaluation and treatment  CONSULTATIONS:   cardiology  PERTINENT RADIOLOGIC STUDIES: Dg Chest 2 View  05/26/2015   CLINICAL DATA:  Acute onset of generalized weakness, fever and increased urinary frequency. Initial encounter.  EXAM: CHEST  2 VIEW  COMPARISON:  Chest radiograph from 10/02/2014  FINDINGS: The lungs are mildly hypoexpanded. Vascular crowding and  vascular congestion are seen. There is no evidence of pleural effusion or pneumothorax.  The heart is normal in size. The patient is status post median sternotomy. No acute osseous abnormalities are seen.  IMPRESSION: Lungs mildly hypoexpanded, with underlying vascular congestion. No definite focal airspace consolidation seen.   Electronically Signed   By: Garald Balding M.D.   On: 05/26/2015 19:39  Ct Head Wo Contrast  05/26/2015   CLINICAL DATA:  Acute onset of fever and weakness. Initial encounter.  EXAM: CT HEAD WITHOUT CONTRAST  TECHNIQUE: Contiguous axial images were obtained from the base of the skull through the vertex without intravenous contrast.  COMPARISON:  CT of the head performed 08/01/2014  FINDINGS: There is no evidence of acute infarction, mass lesion, or intra- or extra-axial hemorrhage on CT.  Prominence of the ventricles and sulci reflects moderate cortical volume loss. A small chronic infarct is noted at the high left frontoparietal region. Scattered periventricular and subcortical white matter change likely reflects small vessel ischemic microangiopathy. Mild cerebellar atrophy is noted, with a chronic infarct at the inferior left cerebellar hemisphere.  The brainstem and fourth ventricle are within normal limits. The basal ganglia are unremarkable in appearance. No mass effect or midline shift is seen.  There is no evidence of fracture; visualized osseous structures are unremarkable in appearance. The orbits are within normal limits. A small mucus retention cyst or polyp is noted at the right maxillary sinus. The remaining paranasal sinuses and mastoid air cells are well-aerated. No significant soft tissue abnormalities are seen.  IMPRESSION: 1. No acute intracranial pathology seen on CT. 2. Moderate cortical volume loss and scattered small vessel ischemic microangiopathy. 3. Small chronic infarct at the high left frontoparietal region. Mild cerebellar atrophy, with chronic infarct at the  inferior left cerebellar hemisphere. 4. Small mucus retention cyst or polyp at the right maxillary sinus.   Electronically Signed   By: Garald Balding M.D.   On: 05/26/2015 20:53   Dg Knee Complete 4 Views Left  05/26/2015   CLINICAL DATA:  Left knee pain/swelling  EXAM: LEFT KNEE - COMPLETE 4+ VIEW  COMPARISON:  03/05/2014  FINDINGS: No fracture or dislocation is seen.  Mild tricompartmental degenerative changes.  Moderate suprapatellar knee joint effusion.  IMPRESSION: No fracture or dislocation is seen.  Mild degenerative changes.  Moderate suprapatellar knee joint effusion.   Electronically Signed   By: Julian Hy M.D.   On: 05/26/2015 21:15     PERTINENT LAB RESULTS: CBC:  Recent Labs  05/26/15 1610  WBC 12.4*  HGB 13.5  HCT 41.2  PLT 148*   CMET CMP     Component Value Date/Time   NA 138 05/29/2015 0514   NA 138 10/22/2014 1407   K 4.3 05/29/2015 0514   K 3.9 10/22/2014 1407   CL 96* 05/29/2015 0514   CO2 33* 05/29/2015 0514   CO2 30* 10/22/2014 1407   GLUCOSE 270* 05/29/2015 0514   GLUCOSE 130 10/22/2014 1407   BUN 41* 05/29/2015 0514   BUN 55.3* 10/22/2014 1407   CREATININE 0.87 05/29/2015 0514   CREATININE 1.4* 10/22/2014 1407   CALCIUM 9.3 05/29/2015 0514   CALCIUM 8.6 10/22/2014 1407   PROT 7.1 11/11/2014 0827   PROT 6.6 10/22/2014 1407   ALBUMIN 3.4* 11/11/2014 0827   ALBUMIN 3.2* 10/22/2014 1407   AST 18 11/11/2014 0827   AST 16 10/22/2014 1407   ALT 12 11/11/2014 0827   ALT 6 10/22/2014 1407   ALKPHOS 61 11/11/2014 0827   ALKPHOS 68 10/22/2014 1407   BILITOT 0.6 11/11/2014 0827   BILITOT 0.46 10/22/2014 1407   GFRNONAA >60 05/29/2015 0514   GFRAA >60 05/29/2015 0514    GFR Estimated Creatinine Clearance: 92.2 mL/min (by C-G formula based on Cr of 0.87). No results for input(s): LIPASE, AMYLASE in the last 72 hours. No results for input(s): CKTOTAL, CKMB, CKMBINDEX,  TROPONINI in the last 72 hours. Invalid input(s): POCBNP No results for  input(s): DDIMER in the last 72 hours. No results for input(s): HGBA1C in the last 72 hours. No results for input(s): CHOL, HDL, LDLCALC, TRIG, CHOLHDL, LDLDIRECT in the last 72 hours. No results for input(s): TSH, T4TOTAL, T3FREE, THYROIDAB in the last 72 hours.  Invalid input(s): FREET3 No results for input(s): VITAMINB12, FOLATE, FERRITIN, TIBC, IRON, RETICCTPCT in the last 72 hours. Coags: No results for input(s): INR in the last 72 hours.  Invalid input(s): PT Microbiology: Recent Results (from the past 240 hour(s))  Gram stain     Status: None   Collection Time: 05/28/15  3:47 PM  Result Value Ref Range Status   Specimen Description SYNOVIAL LEFT KNEE  Final   Special Requests NONE  Final   Gram Stain   Final    ABUNDANT WBC PRESENT,BOTH PMN AND MONONUCLEAR NO ORGANISMS SEEN    Report Status 05/28/2015 FINAL  Final     BRIEF HOSPITAL COURSE:  Generalized weakness: Likely from severe hypokalemia. No evidence of infection-although gives history of subjective fever-UA and chest x-ray negative. Follow clinical course. PT evaluation recommending CIR/SNF-unfortunately he is observation status-per case management and social work-patient will not qualify for either CIR or SNF. Family was given the option of private pay-however family does not have financial resources. Therefore, patient being sent home with maximum home health services. Patient has repair of enterocutaneous fistula next week by general surgery, he would need perhaps reevaluation by physical therapy to see if he can qualify for SNF post surgery.  Active Problems: Hypokalemia and hypomagnesemia: Suspect multifactorial from diuretics and alcohol intake. Potassium now back to normal, magnesium continues to be on the lower side-we will discharge on both potassium and magnesium supplementation.. Have arranged for home health RN to check both BMET and magnesium, and fax results to PCPs office.  Left knee pain: Apparently fell  2 weeks back. Does appear to have some swelling, x-ray of the left knee confirms effusion-underwent arthrocentesis by orthopedics-likely gout-started on steroids-pain significantly improved at the time of discharge. We'll discharge on a three-day course of prednisone.  History of chronic atrial fibrillation: Rate controlled with Coreg, reviewed outpatient cardiology note 5/25-not a candidate for anticoagulation because of alcohol consumption.  Chronic diastolic heart failure: Initially because of hypokalemia/dehydration, Lasix was held, have restarted. Appears euvolemic on exam. Seen by cardiology, recommendations are to continue 80 mg twice daily of Lasix on discharge. Will need close outpatient follow-up of electrolytes-see above  EtOH abuse: No signs of withdrawal, was on CIWA protocol with Ativan  History of right breast cancer: Status post mastectomy, continue tamoxifen.Follow with Dr Lindi Adie  History of cerebellar CVA: Nonfocal exam, continue aspirin/statin. See above regarding anticoagulation  History of CAD: Without chest pain/shortness of breath. Continue aspirin, statin and beta blocker  History of enterocutaneous fistula: Open wound in his abdomen-one care evaluation appreciated, seen by surgical team-no further recommendations-patient was asked to keep appointment with Dr. Ninfa Linden next week for repair of this fistula  CKD stage 3: creatinine close to baseline. Has proteinuria, likely secondary to diabetes. Follow  Essential hypertension: Moderate controlled, continue Coreg, restarted Lasix. Follow BP   Chronic debility:PT eval completed-PT evaluation recommending CIR/SNF-unfortunately he is observation status-per case management and social work-patient will not qualify for either CIR or SNF. Family was given the option of private pay-however family does not have financial resources. Therefore, patient being sent home with maximum home health services.   Diabetes on  insulin: CBGs  stable, continue with Lantus 14 units   History of gout: continue allopurinol and colchicine-on prednisone-see above  Chronic pain syndrome: Continue oxycodone.   TODAY-DAY OF DISCHARGE:  Subjective:   Xane Amsden today has no headache,no chest abdominal pain,no new weakness tingling or numbness, feels much better-with significantly less pain in his left knee today.  Objective:   Blood pressure 162/99, pulse 92, temperature 97.4 F (36.3 C), temperature source Oral, resp. rate 20, height 5\' 8"  (1.727 m), weight 106.641 kg (235 lb 1.6 oz), SpO2 91 %.  Intake/Output Summary (Last 24 hours) at 05/29/15 0951 Last data filed at 05/29/15 0833  Gross per 24 hour  Intake    910 ml  Output   1950 ml  Net  -1040 ml   Filed Weights   05/27/15 0521 05/28/15 0648 05/29/15 0546  Weight: 105.915 kg (233 lb 8 oz) 105.841 kg (233 lb 5.4 oz) 106.641 kg (235 lb 1.6 oz)    Exam Awake Alert, Oriented *3, No new F.N deficits, Normal affect [place].AT,PERRAL Supple Neck,No JVD, No cervical lymphadenopathy appriciated.  Symmetrical Chest wall movement, Good air movement bilaterally, CTAB RRR,No Gallops,Rubs or new Murmurs, No Parasternal Heave +ve B.Sounds, Abd Soft, Non tender, No organomegaly appriciated, No rebound -guarding or rigidity. No Cyanosis, Clubbing or edema, No new Rash or bruise  DISCHARGE CONDITION: Stable  DISPOSITION: Home with home health service  DISCHARGE INSTRUCTIONS:    Activity:  As tolerated with Full fall precautions use walker/cane & assistance as needed  Diet recommendation: Diabetic Diet Heart Healthy diet Fluid restriction 1.5 lit/day  Discharge Instructions    (HEART FAILURE PATIENTS) Call MD:  Anytime you have any of the following symptoms: 1) 3 pound weight gain in 24 hours or 5 pounds in 1 week 2) shortness of breath, with or without a dry hacking cough 3) swelling in the hands, feet or stomach 4) if you have to sleep on extra pillows at night in order to  breathe.    Complete by:  As directed      Diet - low sodium heart healthy    Complete by:  As directed      Diet Carb Modified    Complete by:  As directed      Increase activity slowly    Complete by:  As directed            Follow-up Information    Follow up with GIBSON,DAVID, MD. Schedule an appointment as soon as possible for a visit in 1 week.   Specialty:  Family Medicine   Contact information:   [address] [facility] [place] [address] [phone]       Total Time spent on discharge equals 45 minutes.  SignedOren Binet 05/29/2015 9:51 AM

## 2015-05-29 NOTE — Consult Note (Addendum)
[facility] follow-up: Pt walking in hall with PT, they state pouch is intact with good seal. Pt plans to have home health assistance after discharge.  Extra pouch left at bedside for patient to take home and use PRN if current one leaks.  Pt plans to be followed by [facility] text week for surgical repair of fistula site according to EMR. Please re-consult if further assistance is needed.  Thank-you,  Julien Girt MSN, [place], [place], [place], [place]

## 2015-05-30 ENCOUNTER — Encounter (HOSPITAL_COMMUNITY): Payer: Self-pay | Admitting: *Deleted

## 2015-05-30 NOTE — Progress Notes (Signed)
I received a call back from Dr Henderson Cloud, who reported that Dr Ninfa Linden is aware that patient was just discharged from the hospital and patient does not need another cardiac clearance.  I also asked if patient is on a bowel prep or will need Entereg ans the anwser was "no" to both questions.

## 2015-05-30 NOTE — Progress Notes (Signed)
Anesthesia Chart Review: SAME DAY WORK-UP.  Patient is a 69 year old male scheduled for exploratory laparotomy with small bowel resection for enterocutaneous fistula on 06/02/15 by Dr. Evlyn Courier. He was recently admitted to [facility] from 05/26/15-05/29/15 for generalized weakness likely from severe hypokalemia (2.7) and hypomagnesemia (0.9) felt likely related to diuretic therapy and ETOH intake. Troponin was negative. He was also seen by cardiology (Dr. Gwenlyn Found) during his admission to weigh in on diuretic dosing. He was euvolemic at that time afib was overall rate controlled. Dr. Ninfa Linden is aware of recent admission and pending labs are okay day of surgery and no new changes, he would plan to proceed.  On 05/08/15, his primary cardiologist Dr. Angelena Form wrote a letter stating he felt patient's cardiac issues were stable and could proceed with planned surgery (see Letters tab). He will need close attention to his volume status given his diastolic CHF history.   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 BMI is consistent with obesity. PCP is listed as Dr. Jene Every. Cardiologist is Dr. Angelena Form. HEM-ONC is Dr. Lindi Adie. Neurologist is Dr. Leonie Man.  Meds include Mag oxide, prednisone, Lasix, KCl, allopurinol, ASA,  Lipitor, Coreg, colchicine, Prozac, Flovent, Folvite, Neurontin, Lantus, Synthroid, Zaroxolyn, Prilosec, tamoxifen, Effexor-XR, oxycodone   05/30/14 Nuclear stress test: Overall Impression: Normal stress nuclear study. LV Ejection Fraction: 63%. LV Wall Motion: NL LV Function; NL Wall Motion.  04/21/14 Echo:  - Left ventricle: The cavity size was normal. Wall thickness was normal. Systolic function was normal. The estimated ejectionfraction was in the range of 50% to 55%. Doppler parameters areconsistent with both elevated ventricular end-diastolic fillingpressure and elevated left atrial filling pressure. - Mitral valve: Calcified annulus. Mildly thickened leaflets . - Left atrium: Moderately to severely dilated. - Right ventricle: The cavity size was moderately dilated. - Right atrium: The atrium was moderately dilated. - Atrial septum: No defect or patent foramen ovale was identified.  05/27/15 EKG: Afib at 91 bpm, occasional PVC.  08/01/14 Carotid duplex: LEFT ICA 1-39% stenosis. Right VA flow antegrade. Unable to visual the right ICA and left VA due to poor patient cooperation. Unable to duplicate bilateral 81-19% ICA stenosis documented on prior 02/19/14 exam.  05/26/15 JYN:[id number]: Lungs mildly hypoexpanded, with underlying vascular congestion. No definite focal airspace consolidation seen.  He is scheduled for labs on arrival.His Mg was up to 1.6 on 05/29/15 and K 4.3. He is already scheduled for a T&S, CBC, and BMET. With his ETOH and recent hypomagnesemia history will add Mg and HFP to lab draw.  Patient with multiple co-morbidities. Last month his cardiologist felt he was okay for surgery. He was recently admitted for electrolyte abnormalities--not CHF or afib exacerbations. He was seen by Dr. Gwenlyn Found for diuretic recommendations. He will get new labs on the day of surgery. These will have to be reviewed by his surgeon and anesthesiologist  to ensure they are acceptable for OR. Will  also need to make sure no new CV or CHF symptoms and that afib is rate controlled.  If these issues appear stable then I would anticipate that he could proceed as planned.    George Hugh [facility]/Anesthesiology Phone [phone] 05/30/2015 1:01 PM

## 2015-05-30 NOTE — Progress Notes (Signed)
I spoke with [name]'s daughter, with [name]'s permission. I instructed her to have him take 10 units Lantus the evening prior to surgery.  Medications the morning, I explained to [name] and she explained to him, while we were taking, that patient needs to take as little water as possible to take medications. I instructed patient to take Carvedilol, Synthroid, Omeprazole.  I also instructed patient to take Potassium if he could take with little water.  I explained to [name] that 1/2 water is too much.  [name] instructed paint and both verbalized understanding.

## 2015-05-30 NOTE — Progress Notes (Signed)
I called Dr Trevor Mace office to see if patient has been cleared by cardiologist , since he was d/ced from hospital on 05/29/15 if patient has a prep prior to this surgery.  Dr Trevor Mace nurse will call me back.

## 2015-05-31 NOTE — H&P (Signed)
[name] is an 69 y.o. male.   Chief Complaint: Enterocutaneous fistula HPI: This is an unfortunate gentleman with multiple comorbidities.  Over one year ago, he had an emergent exploratory laparotomy for incarcerated umbilical hernia which resulted in the need for a bowel resection.  He has developed a small enterocutaneous fistula which has not resolved with conservative management. He is starting to have increased drainage at the umbilicus. The plan is to proceed with repair of the fistula. He has also recently had a mastectomy for breast cancer. He has minimal abdominal discomfort.  Past Medical History  Diagnosis Date  . CHRONIC OBSTRUCTIVE PULMONARY DISEASE 06/20/2009  . CAROTID STENOSIS 06/20/2009    A. 08/2001 s/p L CEA;  B.   09/14/11 - Carotid U/S - 40-59% bilateral stenosis, left CEA patch angioplasty is patent  . DM 06/20/2009  . CAD 06/20/2009    A.  08/2000 - s/p CABG x 4 - LIMA-LAD, Left Radial-OM, VG-DIAG, VG-RCA;  B. Neg. MV  2010  . HYPERLIPIDEMIA 06/20/2009  . HYPERTENSION 06/20/2009  . Hypothyroidism   . Low back pain   . Asthma     as child  . Pneumonia   . Persistent atrial fibrillation     Not felt to be coumadin candidate 2/2 ETOH use.  Marland Kitchen History of tobacco abuse     remote - quit 1970  . Morbidly obese   . Chronic diastolic heart failure, NYHA class 3     Followed by [facility]F Clinic --> Echo 04/2014: EF 55-73%, Diastolic DysFxn - elevated LVEDP & LAP. Mod Dil LA & RA.  . Falls frequently   . Hx of cardiovascular stress test 05/2014    Lexiscan Myoview (8/15):  No ischemia; EF 63% - Normal Study  . Breast cancer, right breast   . Gout   . OBSTRUCTIVE SLEEP APNEA 06/20/2009    does not use cpap  . Stroke     found on MRI in Oct. 2015.   Marland Kitchen Shortness of breath dyspnea     occasional  . Depression     takes Prozac  . GERD (gastroesophageal reflux disease)     takes Prilosec  . Arthritis   . Neuromuscular disorder     neuropathy in both legs  . Constipation   .  ETOH abuse     no alcohol since Oct. 2015  . 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 Bilateral renal cysts   . Kidney disease     Stage 3  . CHF (congestive heart failure)   . Supplemental oxygen dependent     hs    Past Surgical History  Procedure Laterality Date  . Carotid endarterectomy  2002    left  . Coronary artery bypass graft      x 4 - 2001  . Umbilical hernia repair N/A 01/28/2014    Procedure: HERNIA REPAIR UMBILICAL ADULT/INC;  Surgeon: Harl Bowie, MD;  Location: [place];  Service: General;  Laterality: N/A;  . Laparotomy N/A 01/28/2014    Procedure: EXPLORATORY LAPAROTOMY;  Surgeon: Harl Bowie, MD;  Location: [place];  Service: General;  Laterality: N/A;  . Bowel resection N/A 01/28/2014    Procedure: SMALL BOWEL RESECTION;  Surgeon: Harl Bowie, MD;  Location: [place];  Service: General;  Laterality: N/A;  . Hernia repair    . Colonoscopy    . Total mastectomy Right 11/11/2014    Procedure:  RIGHT TOTAL MASTECTOMY;  Surgeon: Excell Seltzer, MD;  Location: [place];  Service: General;  Laterality: Right;  . Cataract extraction w/phaco Left 04/08/2015    Procedure: CATARACT EXTRACTION PHACO AND INTRAOCULAR LENS PLACEMENT ([place]);  Surgeon: Birder Robson, MD;  Location: [facility];  Service: Ophthalmology;  Laterality: Left;  [place] 00:36 AP% 23.7 CDE 8.72 fluid pack WPV#[id number] H  . Eye surgery Left     Catarct    Family History  Problem Relation Age of Onset  . Stroke Mother     ?  Marland Kitchen Cancer Mother     unknown type of cancer  . Stroke Father     ?  Marland Kitchen Heart attack Neg Hx   . Cancer Brother 24    stomach cancer   Social History:  reports that he quit smoking about 46 years ago. He has never used smokeless tobacco. He reports that he drinks alcohol. He reports that he uses illicit drugs (Marijuana).  Allergies:  Allergies  Allergen Reactions  . Erythromycin Hives    Oramycin    No prescriptions  prior to admission    No results found for this or any previous visit (from the past 48 hour(s)). No results found.  ROS  There were no vitals taken for this visit. Physical Exam  Constitutional: He is oriented to person, place, and time.  Elderly-appearing gentleman who has difficulty ambulating  HENT:  Head: Normocephalic and atraumatic.  Right Ear: External ear normal.  Left Ear: External ear normal.  Mouth/Throat: No oropharyngeal exudate.  Eyes: Conjunctivae are normal. Pupils are equal, round, and reactive to light. No scleral icterus.  Neck: Normal range of motion. No tracheal deviation present.  Cardiovascular: Normal rate and regular rhythm.   Murmur heard. Respiratory: Effort normal and breath sounds normal. No respiratory distress.  GI: Soft. There is no tenderness.  Small opening at the umbilicus with some bilious drainage and skin irritation  Musculoskeletal: Normal range of motion. He exhibits edema.  Lymphadenopathy:    He has no cervical adenopathy.  Neurological: He is alert and oriented to person, place, and time.  Skin: Skin is warm. There is erythema.  Psychiatric: His behavior is normal. Judgment normal.     Assessment/Plan Enterocutaneous fistula  As this has not resolved with conservative management, the only chance to get this to resolve would be with an exploratory laparotomy and bowel resection. He has had clearance from cardiologist. I discussed the risks which include but are not limited to bleeding, infection, anastomotic leak, need for further surgery, injury to surrounding structures, cardiopulmonary issues, DVT, etc. He understands and wishes to proceed with surgery which will be scheduled  [name] A 05/31/2015, 6:42 PM

## 2015-06-02 ENCOUNTER — Inpatient Hospital Stay (HOSPITAL_COMMUNITY): Payer: Medicare Other | Admitting: Vascular Surgery

## 2015-06-02 ENCOUNTER — Encounter (HOSPITAL_COMMUNITY): Payer: Self-pay | Admitting: General Practice

## 2015-06-02 ENCOUNTER — Inpatient Hospital Stay (HOSPITAL_COMMUNITY)
Admission: RE | Admit: 2015-06-02 | Discharge: 2015-06-09 | DRG: 330 | Disposition: A | Discharge status: Rehabilitation Facility | Payer: Medicare Other | Source: Ambulatory Visit | Attending: Surgery | Admitting: Surgery

## 2015-06-02 ENCOUNTER — Encounter (HOSPITAL_COMMUNITY)
Admission: RE | Disposition: A | Discharge status: Rehabilitation Facility | Payer: Self-pay | Source: Ambulatory Visit | Attending: Surgery

## 2015-06-02 DIAGNOSIS — I129 Hypertensive chronic kidney disease with stage 1 through stage 4 chronic kidney disease, or unspecified chronic kidney disease: Secondary | ICD-10-CM | POA: Diagnosis present

## 2015-06-02 DIAGNOSIS — Z8673 Personal history of transient ischemic attack (TIA), and cerebral infarction without residual deficits: Secondary | ICD-10-CM

## 2015-06-02 DIAGNOSIS — I10 Essential (primary) hypertension: Secondary | ICD-10-CM | POA: Diagnosis present

## 2015-06-02 DIAGNOSIS — E039 Hypothyroidism, unspecified: Secondary | ICD-10-CM | POA: Diagnosis present

## 2015-06-02 DIAGNOSIS — E1140 Type 2 diabetes mellitus with diabetic neuropathy, unspecified: Secondary | ICD-10-CM | POA: Diagnosis present

## 2015-06-02 DIAGNOSIS — K432 Incisional hernia without obstruction or gangrene: Secondary | ICD-10-CM | POA: Diagnosis present

## 2015-06-02 DIAGNOSIS — K632 Fistula of intestine: Principal | ICD-10-CM | POA: Diagnosis present

## 2015-06-02 DIAGNOSIS — G4733 Obstructive sleep apnea (adult) (pediatric): Secondary | ICD-10-CM | POA: Diagnosis present

## 2015-06-02 DIAGNOSIS — Z9010 Acquired absence of unspecified breast and nipple: Secondary | ICD-10-CM | POA: Diagnosis present

## 2015-06-02 DIAGNOSIS — G9341 Metabolic encephalopathy: Secondary | ICD-10-CM | POA: Diagnosis not present

## 2015-06-02 DIAGNOSIS — E785 Hyperlipidemia, unspecified: Secondary | ICD-10-CM | POA: Diagnosis present

## 2015-06-02 DIAGNOSIS — Z853 Personal history of malignant neoplasm of breast: Secondary | ICD-10-CM

## 2015-06-02 DIAGNOSIS — Z951 Presence of aortocoronary bypass graft: Secondary | ICD-10-CM

## 2015-06-02 DIAGNOSIS — J449 Chronic obstructive pulmonary disease, unspecified: Secondary | ICD-10-CM | POA: Diagnosis present

## 2015-06-02 DIAGNOSIS — I480 Paroxysmal atrial fibrillation: Secondary | ICD-10-CM | POA: Diagnosis not present

## 2015-06-02 DIAGNOSIS — Z87891 Personal history of nicotine dependence: Secondary | ICD-10-CM | POA: Diagnosis not present

## 2015-06-02 DIAGNOSIS — R0602 Shortness of breath: Secondary | ICD-10-CM | POA: Diagnosis not present

## 2015-06-02 DIAGNOSIS — Z6835 Body mass index (BMI) 35.0-35.9, adult: Secondary | ICD-10-CM

## 2015-06-02 DIAGNOSIS — J438 Other emphysema: Secondary | ICD-10-CM | POA: Diagnosis not present

## 2015-06-02 DIAGNOSIS — Z9981 Dependence on supplemental oxygen: Secondary | ICD-10-CM | POA: Diagnosis not present

## 2015-06-02 DIAGNOSIS — F329 Major depressive disorder, single episode, unspecified: Secondary | ICD-10-CM | POA: Diagnosis present

## 2015-06-02 DIAGNOSIS — I5033 Acute on chronic diastolic (congestive) heart failure: Secondary | ICD-10-CM | POA: Diagnosis not present

## 2015-06-02 DIAGNOSIS — I481 Persistent atrial fibrillation: Secondary | ICD-10-CM | POA: Diagnosis present

## 2015-06-02 DIAGNOSIS — K219 Gastro-esophageal reflux disease without esophagitis: Secondary | ICD-10-CM | POA: Diagnosis present

## 2015-06-02 DIAGNOSIS — N183 Chronic kidney disease, stage 3 (moderate): Secondary | ICD-10-CM | POA: Diagnosis present

## 2015-06-02 DIAGNOSIS — E1142 Type 2 diabetes mellitus with diabetic polyneuropathy: Secondary | ICD-10-CM | POA: Diagnosis not present

## 2015-06-02 DIAGNOSIS — I5032 Chronic diastolic (congestive) heart failure: Secondary | ICD-10-CM | POA: Diagnosis present

## 2015-06-02 DIAGNOSIS — E1122 Type 2 diabetes mellitus with diabetic chronic kidney disease: Secondary | ICD-10-CM | POA: Diagnosis present

## 2015-06-02 DIAGNOSIS — M545 Low back pain: Secondary | ICD-10-CM | POA: Diagnosis present

## 2015-06-02 DIAGNOSIS — I2510 Atherosclerotic heart disease of native coronary artery without angina pectoris: Secondary | ICD-10-CM | POA: Diagnosis present

## 2015-06-02 DIAGNOSIS — R5381 Other malaise: Secondary | ICD-10-CM | POA: Diagnosis not present

## 2015-06-02 HISTORY — PX: LAPAROTOMY: SHX154

## 2015-06-02 HISTORY — PX: BOWEL RESECTION: SHX1257

## 2015-06-02 LAB — HEPATIC FUNCTION PANEL
ALBUMIN: 2.8 g/dL — AB (ref 3.5–5.0)
ALT: 16 U/L — AB (ref 17–63)
AST: 22 U/L (ref 15–41)
Alkaline Phosphatase: 55 U/L (ref 38–126)
Bilirubin, Direct: 0.2 mg/dL (ref 0.1–0.5)
Indirect Bilirubin: 0.6 mg/dL (ref 0.3–0.9)
TOTAL PROTEIN: 6.5 g/dL (ref 6.5–8.1)
Total Bilirubin: 0.8 mg/dL (ref 0.3–1.2)

## 2015-06-02 LAB — BASIC METABOLIC PANEL
Anion gap: 9 (ref 5–15)
BUN: 37 mg/dL — AB (ref 6–20)
CHLORIDE: 94 mmol/L — AB (ref 101–111)
CO2: 32 mmol/L (ref 22–32)
Calcium: 9.1 mg/dL (ref 8.9–10.3)
Creatinine, Ser: 0.83 mg/dL (ref 0.61–1.24)
GFR calc non Af Amer: >60 mL/min (ref >60)
Glucose, Bld: 195 mg/dL — ABNORMAL HIGH (ref 65–99)
POTASSIUM: 5.4 mmol/L — AB (ref 3.5–5.1)
SODIUM: 135 mmol/L (ref 135–145)

## 2015-06-02 LAB — CULTURE, BODY FLUID W GRAM STAIN -BOTTLE

## 2015-06-02 LAB — TYPE AND SCREEN
ABO/RH(D): A POS
Antibody Screen: NEGATIVE

## 2015-06-02 LAB — CBC
HEMATOCRIT: 42.9 % (ref 39.0–52.0)
HEMOGLOBIN: 13.4 g/dL (ref 13.0–17.0)
MCH: 31.2 pg (ref 26.0–34.0)
MCHC: 31.2 g/dL (ref 30.0–36.0)
MCV: 99.8 fL (ref 78.0–100.0)
Platelets: 167 10*3/uL (ref 150–400)
RBC: 4.3 MIL/uL (ref 4.22–5.81)
RDW: 14.9 % (ref 11.5–15.5)
WBC: 11.6 10*3/uL — AB (ref 4.0–10.5)

## 2015-06-02 LAB — MAGNESIUM: Magnesium: 1.7 mg/dL (ref 1.7–2.4)

## 2015-06-02 LAB — GLUCOSE, CAPILLARY
GLUCOSE-CAPILLARY: 198 mg/dL — AB (ref 65–99)
Glucose-Capillary: 194 mg/dL — ABNORMAL HIGH (ref 65–99)
Glucose-Capillary: 130 mg/dL — ABNORMAL HIGH (ref 65–99)
Glucose-Capillary: 172 mg/dL — ABNORMAL HIGH (ref 65–99)

## 2015-06-02 LAB — ABO/RH: ABO/RH(D): A POS

## 2015-06-02 LAB — MRSA PCR SCREENING: MRSA BY PCR: NEGATIVE

## 2015-06-02 LAB — CULTURE, BODY FLUID-BOTTLE: CULTURE: NO GROWTH

## 2015-06-02 SURGERY — LAPAROTOMY, EXPLORATORY
Anesthesia: General | Site: Abdomen

## 2015-06-02 MED ORDER — MORPHINE SULFATE (PF) 4 MG/ML IV SOLN
INTRAVENOUS | Status: AC
  Administered 2015-06-02: 4 mg
  Filled 2015-06-02: qty 1

## 2015-06-02 MED ORDER — INSULIN ASPART 100 UNIT/ML ~~LOC~~ SOLN
0.0000 [IU] | SUBCUTANEOUS | Status: DC
  Administered 2015-06-02 – 2015-06-03 (×4): 4 [IU] via SUBCUTANEOUS
  Administered 2015-06-04: 7 [IU] via SUBCUTANEOUS
  Administered 2015-06-04: 4 [IU] via SUBCUTANEOUS
  Administered 2015-06-05: 3 [IU] via SUBCUTANEOUS

## 2015-06-02 MED ORDER — HYDROMORPHONE HCL 1 MG/ML IJ SOLN
INTRAMUSCULAR | Status: AC
  Filled 2015-06-02: qty 1

## 2015-06-02 MED ORDER — EPHEDRINE SULFATE 50 MG/ML IJ SOLN
INTRAMUSCULAR | Status: AC
  Filled 2015-06-02: qty 1

## 2015-06-02 MED ORDER — FOLIC ACID 1 MG PO TABS
1.0000 mg | ORAL_TABLET | Freq: Every day | ORAL | Status: DC
  Administered 2015-06-02 – 2015-06-09 (×8): 1 mg via ORAL
  Filled 2015-06-02 (×8): qty 1

## 2015-06-02 MED ORDER — ONDANSETRON HCL 4 MG/2ML IJ SOLN
INTRAMUSCULAR | Status: DC | PRN
  Administered 2015-06-02: 4 mg via INTRAVENOUS

## 2015-06-02 MED ORDER — LEVOTHYROXINE SODIUM 50 MCG PO TABS
187.0000 ug | ORAL_TABLET | Freq: Every day | ORAL | Status: DC
  Administered 2015-06-03 – 2015-06-09 (×7): 187 ug via ORAL
  Filled 2015-06-02 (×18): qty 1

## 2015-06-02 MED ORDER — POTASSIUM CHLORIDE IN NACL 20-0.9 MEQ/L-% IV SOLN
INTRAVENOUS | Status: DC
  Administered 2015-06-02 – 2015-06-05 (×5): via INTRAVENOUS
  Filled 2015-06-02 (×6): qty 1000

## 2015-06-02 MED ORDER — PHENYLEPHRINE HCL 10 MG/ML IJ SOLN
INTRAMUSCULAR | Status: DC | PRN
  Administered 2015-06-02: 80 ug via INTRAVENOUS

## 2015-06-02 MED ORDER — DIPHENHYDRAMINE HCL 50 MG/ML IJ SOLN: 12.5000 mg | Freq: Four times a day (QID) | INTRAMUSCULAR | Status: DC | PRN

## 2015-06-02 MED ORDER — CEFAZOLIN SODIUM 1-5 GM-% IV SOLN: INTRAVENOUS | Status: DC | PRN

## 2015-06-02 MED ORDER — SODIUM CHLORIDE 0.9 % IV SOLN
INTRAVENOUS | Status: DC
  Administered 2015-06-02: 10:00:00 via INTRAVENOUS
  Administered 2015-06-02: 10 mL/h via INTRAVENOUS
  Administered 2015-06-02: 08:00:00 via INTRAVENOUS

## 2015-06-02 MED ORDER — PROPOFOL 10 MG/ML IV BOLUS
INTRAVENOUS | Status: AC
  Filled 2015-06-02: qty 20

## 2015-06-02 MED ORDER — FUROSEMIDE 80 MG PO TABS
80.0000 mg | ORAL_TABLET | Freq: Two times a day (BID) | ORAL | Status: DC
  Administered 2015-06-02 – 2015-06-09 (×14): 80 mg via ORAL
  Filled 2015-06-02 (×15): qty 1

## 2015-06-02 MED ORDER — 0.9 % SODIUM CHLORIDE (POUR BTL) OPTIME
TOPICAL | Status: DC | PRN
  Administered 2015-06-02: 1000 mL

## 2015-06-02 MED ORDER — CEFAZOLIN SODIUM-DEXTROSE 2-3 GM-% IV SOLR
2.0000 g | INTRAVENOUS | Status: AC
  Administered 2015-06-02: 2 g via INTRAVENOUS

## 2015-06-02 MED ORDER — LIDOCAINE HCL (CARDIAC) 20 MG/ML IV SOLN
INTRAVENOUS | Status: AC
  Filled 2015-06-02: qty 5

## 2015-06-02 MED ORDER — SODIUM CHLORIDE 0.9 % IV SOLN
10.0000 mg | INTRAVENOUS | Status: DC | PRN
  Administered 2015-06-02: 20 ug/min via INTRAVENOUS

## 2015-06-02 MED ORDER — PROPOFOL 10 MG/ML IV BOLUS
INTRAVENOUS | Status: DC | PRN
  Administered 2015-06-02: 150 mg via INTRAVENOUS

## 2015-06-02 MED ORDER — OXYCODONE HCL 5 MG PO TABS
10.0000 mg | ORAL_TABLET | Freq: Once | ORAL | Status: AC
  Administered 2015-06-02: 10 mg via ORAL

## 2015-06-02 MED ORDER — NEOSTIGMINE METHYLSULFATE 10 MG/10ML IV SOLN
INTRAVENOUS | Status: AC
  Filled 2015-06-02: qty 1

## 2015-06-02 MED ORDER — VENLAFAXINE HCL ER 37.5 MG PO CP24
37.5000 mg | ORAL_CAPSULE | Freq: Every day | ORAL | Status: DC
  Administered 2015-06-02 – 2015-06-09 (×8): 37.5 mg via ORAL
  Filled 2015-06-02 (×9): qty 1

## 2015-06-02 MED ORDER — SODIUM CHLORIDE 0.9 % IJ SOLN
INTRAMUSCULAR | Status: AC
  Filled 2015-06-02: qty 10

## 2015-06-02 MED ORDER — OXYCODONE HCL 5 MG PO TABS
ORAL_TABLET | ORAL | Status: AC
  Administered 2015-06-02: 10 mg via ORAL
  Filled 2015-06-02: qty 2

## 2015-06-02 MED ORDER — FENTANYL CITRATE (PF) 100 MCG/2ML IJ SOLN
INTRAMUSCULAR | Status: DC | PRN
  Administered 2015-06-02 (×2): 50 ug via INTRAVENOUS
  Administered 2015-06-02: 100 ug via INTRAVENOUS
  Administered 2015-06-02: 50 ug via INTRAVENOUS

## 2015-06-02 MED ORDER — PHENYLEPHRINE 40 MCG/ML (10ML) SYRINGE FOR IV PUSH (FOR BLOOD PRESSURE SUPPORT)
PREFILLED_SYRINGE | INTRAVENOUS | Status: AC
  Filled 2015-06-02: qty 10

## 2015-06-02 MED ORDER — MORPHINE SULFATE (PF) 2 MG/ML IV SOLN
1.0000 mg | INTRAVENOUS | Status: DC | PRN
  Administered 2015-06-02 – 2015-06-03 (×3): 2 mg via INTRAVENOUS
  Administered 2015-06-03: 4 mg via INTRAVENOUS
  Administered 2015-06-03 – 2015-06-04 (×5): 2 mg via INTRAVENOUS
  Administered 2015-06-05: 1 mg via INTRAVENOUS
  Administered 2015-06-05 – 2015-06-06 (×2): 2 mg via INTRAVENOUS
  Administered 2015-06-06 – 2015-06-07 (×2): 4 mg via INTRAVENOUS
  Administered 2015-06-07: 1 mg via INTRAVENOUS
  Administered 2015-06-07 – 2015-06-08 (×2): 2 mg via INTRAVENOUS
  Filled 2015-06-02 (×8): qty 1
  Filled 2015-06-02: qty 2
  Filled 2015-06-02 (×2): qty 1
  Filled 2015-06-02 (×2): qty 2
  Filled 2015-06-02 (×6): qty 1

## 2015-06-02 MED ORDER — ENOXAPARIN SODIUM 40 MG/0.4ML ~~LOC~~ SOLN
40.0000 mg | SUBCUTANEOUS | Status: DC
  Administered 2015-06-03 – 2015-06-09 (×7): 40 mg via SUBCUTANEOUS
  Filled 2015-06-02 (×8): qty 0.4

## 2015-06-02 MED ORDER — LABETALOL HCL 5 MG/ML IV SOLN
INTRAVENOUS | Status: AC
  Filled 2015-06-02: qty 4

## 2015-06-02 MED ORDER — PROMETHAZINE HCL 25 MG/ML IJ SOLN
INTRAMUSCULAR | Status: AC
  Administered 2015-06-02: 6.25 mg via INTRAVENOUS
  Filled 2015-06-02: qty 1

## 2015-06-02 MED ORDER — EPHEDRINE SULFATE 50 MG/ML IJ SOLN
INTRAMUSCULAR | Status: DC | PRN
  Administered 2015-06-02: 15 mg via INTRAVENOUS
  Administered 2015-06-02: 10 mg via INTRAVENOUS

## 2015-06-02 MED ORDER — FENTANYL CITRATE (PF) 250 MCG/5ML IJ SOLN
INTRAMUSCULAR | Status: AC
  Filled 2015-06-02: qty 5

## 2015-06-02 MED ORDER — COLCHICINE 0.6 MG PO TABS
0.6000 mg | ORAL_TABLET | Freq: Every day | ORAL | Status: DC
  Administered 2015-06-02 – 2015-06-09 (×8): 0.6 mg via ORAL
  Filled 2015-06-02 (×8): qty 1

## 2015-06-02 MED ORDER — LABETALOL HCL 5 MG/ML IV SOLN
5.0000 mg | INTRAVENOUS | Status: DC | PRN
Start: 2015-06-02 — End: 2015-06-02
  Administered 2015-06-02 (×2): 5 mg via INTRAVENOUS

## 2015-06-02 MED ORDER — ONDANSETRON HCL 4 MG/2ML IJ SOLN: 4.0000 mg | Freq: Four times a day (QID) | INTRAMUSCULAR | Status: DC | PRN

## 2015-06-02 MED ORDER — TAMOXIFEN CITRATE 10 MG PO TABS
20.0000 mg | ORAL_TABLET | Freq: Every day | ORAL | Status: DC
  Administered 2015-06-02 – 2015-06-09 (×8): 20 mg via ORAL
  Filled 2015-06-02 (×9): qty 2

## 2015-06-02 MED ORDER — HYDROMORPHONE HCL 1 MG/ML IJ SOLN
0.2500 mg | INTRAMUSCULAR | Status: DC | PRN
  Administered 2015-06-02: 0.5 mg via INTRAVENOUS
  Administered 2015-06-02: 0.25 mg via INTRAVENOUS
  Administered 2015-06-02: 0.5 mg via INTRAVENOUS
  Administered 2015-06-02: 0.25 mg via INTRAVENOUS
  Administered 2015-06-02: 0.5 mg via INTRAVENOUS

## 2015-06-02 MED ORDER — SUCCINYLCHOLINE CHLORIDE 20 MG/ML IJ SOLN
INTRAMUSCULAR | Status: AC
  Filled 2015-06-02: qty 1

## 2015-06-02 MED ORDER — OXYCODONE HCL 5 MG/5ML PO SOLN: 5.0000 mg | Freq: Once | ORAL | Status: AC | PRN

## 2015-06-02 MED ORDER — HYDRALAZINE HCL 20 MG/ML IJ SOLN
20.0000 mg | INTRAMUSCULAR | Status: DC | PRN
  Administered 2015-06-02 – 2015-06-03 (×2): 20 mg via INTRAVENOUS
  Filled 2015-06-02 (×2): qty 1

## 2015-06-02 MED ORDER — OXYCODONE HCL 5 MG PO TABS
5.0000 mg | ORAL_TABLET | Freq: Once | ORAL | Status: AC | PRN
  Administered 2015-06-02: 5 mg via ORAL

## 2015-06-02 MED ORDER — DIPHENHYDRAMINE HCL 12.5 MG/5ML PO ELIX
12.5000 mg | ORAL_SOLUTION | Freq: Four times a day (QID) | ORAL | Status: DC | PRN
  Filled 2015-06-02: qty 5

## 2015-06-02 MED ORDER — BUDESONIDE 0.25 MG/2ML IN SUSP
0.2500 mg | Freq: Two times a day (BID) | RESPIRATORY_TRACT | Status: DC
  Administered 2015-06-02 – 2015-06-09 (×13): 0.25 mg via RESPIRATORY_TRACT
  Filled 2015-06-02 (×17): qty 2

## 2015-06-02 MED ORDER — OXYCODONE HCL 5 MG PO TABS
ORAL_TABLET | ORAL | Status: AC
  Administered 2015-06-02: 5 mg
  Filled 2015-06-02: qty 1

## 2015-06-02 MED ORDER — GLYCOPYRROLATE 0.2 MG/ML IJ SOLN
INTRAMUSCULAR | Status: DC | PRN
  Administered 2015-06-02: .8 mg via INTRAVENOUS

## 2015-06-02 MED ORDER — ROCURONIUM BROMIDE 50 MG/5ML IV SOLN
INTRAVENOUS | Status: AC
  Filled 2015-06-02: qty 1

## 2015-06-02 MED ORDER — LIDOCAINE HCL (CARDIAC) 20 MG/ML IV SOLN
INTRAVENOUS | Status: DC | PRN
  Administered 2015-06-02: 50 mg via INTRAVENOUS

## 2015-06-02 MED ORDER — ONDANSETRON 4 MG PO TBDP
4.0000 mg | ORAL_TABLET | Freq: Four times a day (QID) | ORAL | Status: DC | PRN
  Administered 2015-06-02: 4 mg via ORAL
  Filled 2015-06-02 (×2): qty 1

## 2015-06-02 MED ORDER — FLUOXETINE HCL 20 MG PO CAPS
20.0000 mg | ORAL_CAPSULE | Freq: Every day | ORAL | Status: DC
  Administered 2015-06-02 – 2015-06-09 (×8): 20 mg via ORAL
  Filled 2015-06-02 (×8): qty 1

## 2015-06-02 MED ORDER — OXYCODONE HCL 5 MG PO TABS
5.0000 mg | ORAL_TABLET | ORAL | Status: DC | PRN
  Administered 2015-06-02 – 2015-06-09 (×14): 10 mg via ORAL
  Filled 2015-06-02 (×13): qty 2

## 2015-06-02 MED ORDER — PANTOPRAZOLE SODIUM 40 MG IV SOLR
40.0000 mg | Freq: Every day | INTRAVENOUS | Status: DC
  Administered 2015-06-02 – 2015-06-03 (×2): 40 mg via INTRAVENOUS
  Filled 2015-06-02 (×3): qty 40

## 2015-06-02 MED ORDER — GABAPENTIN 300 MG PO CAPS
300.0000 mg | ORAL_CAPSULE | Freq: Two times a day (BID) | ORAL | Status: DC
  Administered 2015-06-02 – 2015-06-09 (×13): 300 mg via ORAL
  Filled 2015-06-02 (×14): qty 1

## 2015-06-02 MED ORDER — ROCURONIUM BROMIDE 100 MG/10ML IV SOLN
INTRAVENOUS | Status: DC | PRN
  Administered 2015-06-02: 50 mg via INTRAVENOUS

## 2015-06-02 MED ORDER — ONDANSETRON HCL 4 MG/2ML IJ SOLN
INTRAMUSCULAR | Status: AC
  Filled 2015-06-02: qty 4

## 2015-06-02 MED ORDER — NEOSTIGMINE METHYLSULFATE 10 MG/10ML IV SOLN
INTRAVENOUS | Status: DC | PRN
  Administered 2015-06-02: 4 mg via INTRAVENOUS

## 2015-06-02 MED ORDER — LABETALOL HCL 5 MG/ML IV SOLN
INTRAVENOUS | Status: DC | PRN
  Administered 2015-06-02: 5 mg via INTRAVENOUS

## 2015-06-02 MED ORDER — GLYCOPYRROLATE 0.2 MG/ML IJ SOLN
INTRAMUSCULAR | Status: AC
  Filled 2015-06-02: qty 4

## 2015-06-02 MED ORDER — CARVEDILOL 3.125 MG PO TABS
3.1250 mg | ORAL_TABLET | Freq: Two times a day (BID) | ORAL | Status: DC
  Administered 2015-06-03 – 2015-06-09 (×13): 3.125 mg via ORAL
  Filled 2015-06-02 (×15): qty 1

## 2015-06-02 MED ORDER — MAGNESIUM OXIDE 400 (241.3 MG) MG PO TABS
200.0000 mg | ORAL_TABLET | Freq: Two times a day (BID) | ORAL | Status: DC
  Administered 2015-06-02 – 2015-06-09 (×13): 200 mg via ORAL
  Filled 2015-06-02 (×4): qty 0.5
  Filled 2015-06-02: qty 1
  Filled 2015-06-02 (×14): qty 0.5

## 2015-06-02 MED ORDER — OXYCODONE HCL 5 MG PO TABS
ORAL_TABLET | ORAL | Status: AC
  Filled 2015-06-02: qty 1

## 2015-06-02 MED ORDER — ALLOPURINOL 300 MG PO TABS
300.0000 mg | ORAL_TABLET | Freq: Every day | ORAL | Status: DC
  Administered 2015-06-02 – 2015-06-09 (×8): 300 mg via ORAL
  Filled 2015-06-02 (×8): qty 1

## 2015-06-02 MED ORDER — PROMETHAZINE HCL 25 MG/ML IJ SOLN
6.2500 mg | INTRAMUSCULAR | Status: DC | PRN
  Administered 2015-06-02: 6.25 mg via INTRAVENOUS

## 2015-06-02 MED ORDER — OXYCODONE HCL 5 MG PO TABS
ORAL_TABLET | ORAL | Status: AC
  Filled 2015-06-02: qty 2

## 2015-06-02 SURGICAL SUPPLY — 61 items
BLADE SURG ROTATE 9660 (MISCELLANEOUS) IMPLANT
BNDG GAUZE ELAST 4 BULKY (GAUZE/BANDAGES/DRESSINGS) ×2 IMPLANT
CANISTER SUCTION 2500CC (MISCELLANEOUS) ×2 IMPLANT
CHLORAPREP W/TINT 26ML (MISCELLANEOUS) ×2 IMPLANT
COVER MAYO STAND STRL (DRAPES) IMPLANT
COVER SURGICAL LIGHT HANDLE (MISCELLANEOUS) ×2 IMPLANT
DRAPE LAPAROSCOPIC ABDOMINAL (DRAPES) ×2 IMPLANT
DRAPE PROXIMA HALF (DRAPES) IMPLANT
DRAPE UTILITY XL STRL (DRAPES) ×4 IMPLANT
DRAPE WARM FLUID 44X44 (DRAPE) ×2 IMPLANT
DRSG MEPILEX BORDER 4X12 (GAUZE/BANDAGES/DRESSINGS) IMPLANT
DRSG MEPILEX BORDER 4X8 (GAUZE/BANDAGES/DRESSINGS) IMPLANT
DRSG OPSITE POSTOP 4X10 (GAUZE/BANDAGES/DRESSINGS) IMPLANT
DRSG OPSITE POSTOP 4X8 (GAUZE/BANDAGES/DRESSINGS) IMPLANT
DRSG TEGADERM 4X4.75 (GAUZE/BANDAGES/DRESSINGS) ×2 IMPLANT
ELECT BLADE 6.5 EXT (BLADE) IMPLANT
ELECT CAUTERY BLADE 6.4 (BLADE) ×4 IMPLANT
ELECT REM PT RETURN 9FT ADLT (ELECTROSURGICAL) ×2
ELECTRODE REM PT RTRN 9FT ADLT (ELECTROSURGICAL) ×1 IMPLANT
GAUZE SPONGE 4X4 12PLY STRL (GAUZE/BANDAGES/DRESSINGS) ×2 IMPLANT
GAUZE XEROFORM 5X9 LF (GAUZE/BANDAGES/DRESSINGS) ×2 IMPLANT
GLOVE BIO SURGEON STRL SZ7 (GLOVE) ×2 IMPLANT
GLOVE BIO SURGEON STRL SZ7.5 (GLOVE) ×2 IMPLANT
GLOVE BIOGEL PI IND STRL 6.5 (GLOVE) ×2 IMPLANT
GLOVE BIOGEL PI IND STRL 7.5 (GLOVE) ×1 IMPLANT
GLOVE BIOGEL PI INDICATOR 6.5 (GLOVE) ×2
GLOVE BIOGEL PI INDICATOR 7.5 (GLOVE) ×1
GLOVE SURG SIGNA 7.5 PF LTX (GLOVE) ×4 IMPLANT
GOWN STRL REUS W/ TWL LRG LVL3 (GOWN DISPOSABLE) ×2 IMPLANT
GOWN STRL REUS W/ TWL XL LVL3 (GOWN DISPOSABLE) ×1 IMPLANT
GOWN STRL REUS W/TWL LRG LVL3 (GOWN DISPOSABLE) ×2
GOWN STRL REUS W/TWL XL LVL3 (GOWN DISPOSABLE) ×1
KIT BASIN OR (CUSTOM PROCEDURE TRAY) ×2 IMPLANT
KIT ROOM TURNOVER OR (KITS) ×2 IMPLANT
LIGASURE IMPACT 36 18CM CVD LR (INSTRUMENTS) ×4 IMPLANT
NS IRRIG 1000ML POUR BTL (IV SOLUTION) ×4 IMPLANT
PACK GENERAL/GYN (CUSTOM PROCEDURE TRAY) ×2 IMPLANT
PAD ARMBOARD 7.5X6 YLW CONV (MISCELLANEOUS) ×2 IMPLANT
PENCIL BUTTON HOLSTER BLD 10FT (ELECTRODE) IMPLANT
RELOAD PROXIMATE TA60MM BLUE (ENDOMECHANICALS) ×2 IMPLANT
SPECIMEN JAR LARGE (MISCELLANEOUS) ×2 IMPLANT
SPONGE GAUZE 4X4 12PLY STER LF (GAUZE/BANDAGES/DRESSINGS) ×2 IMPLANT
SPONGE LAP 18X18 X RAY DECT (DISPOSABLE) IMPLANT
STAPLER GUN LINEAR PROX 60 (STAPLE) ×2 IMPLANT
STAPLER VISISTAT 35W (STAPLE) ×2 IMPLANT
SUCTION POOLE TIP (SUCTIONS) ×2 IMPLANT
SUT NOVA 1 T20/GS 25DT (SUTURE) ×2 IMPLANT
SUT PDS AB 1 TP1 96 (SUTURE) ×4 IMPLANT
SUT SILK 2 0 SH CR/8 (SUTURE) ×2 IMPLANT
SUT SILK 2 0 TIES 10X30 (SUTURE) ×2 IMPLANT
SUT SILK 3 0 SH CR/8 (SUTURE) ×2 IMPLANT
SUT SILK 3 0 TIES 10X30 (SUTURE) ×2 IMPLANT
SUT VIC AB 3-0 SH 18 (SUTURE) IMPLANT
TAPE CLOTH SURG 6X10 WHT LF (GAUZE/BANDAGES/DRESSINGS) ×2 IMPLANT
TOWEL OR 17X24 6PK STRL BLUE (TOWEL DISPOSABLE) ×2 IMPLANT
TOWEL OR 17X26 10 PK STRL BLUE (TOWEL DISPOSABLE) ×2 IMPLANT
TRAY FOLEY CATH 14FRSI W/METER (CATHETERS) IMPLANT
TRAY FOLEY CATH 16FRSI W/METER (SET/KITS/TRAYS/PACK) IMPLANT
TUBE CONNECTING 12X1/4 (SUCTIONS) IMPLANT
WATER STERILE IRR 1000ML POUR (IV SOLUTION) IMPLANT
YANKAUER SUCT BULB TIP NO VENT (SUCTIONS) ×2 IMPLANT

## 2015-06-02 NOTE — Op Note (Signed)
EXPLORATORY LAPAROTOMY, SMALL BOWEL RESECTION  Procedure Note  [name] 06/02/2015   Pre-op Diagnosis: Enterocutaneous Fistula     Post-op Diagnosis: same  Procedure(s): EXPLORATORY LAPAROTOMY SMALL BOWEL RESECTION  Surgeon(s): Coralie Keens, MD  Anesthesia: General  Staff:  Circulator: Harrel Lemon, RN; Christen Bame, RN Scrub Person: Jon Gills Kretschmaier RN First Assistant: Quincy Carnes, RN  Estimated Blood Loss: Minimal               Specimens: sent to path          [facility] A   Date: 06/02/2015  Time: 10:28 AM

## 2015-06-02 NOTE — Transfer of Care (Signed)
Immediate Anesthesia Transfer of Care Note  Patient: [name]  Procedure(s) Performed: Procedure(s): EXPLORATORY LAPAROTOMY (N/A) SMALL BOWEL RESECTION (N/A)  Patient Location: PACU  Anesthesia Type:General  Level of Consciousness: awake, alert  and oriented  Airway & Oxygen Therapy: Patient Spontanous Breathing and Patient connected to face mask oxygen  Post-op Assessment: Report given to RN, Post -op Vital signs reviewed and stable and Patient moving all extremities X 4  Post vital signs: Reviewed and stable  Last Vitals:  Filed Vitals:   06/02/15 0710  BP: 164/83  Temp: 36.7 C  Resp: 20    Complications: No apparent anesthesia complications

## 2015-06-02 NOTE — Progress Notes (Signed)
Instructed patient on Incentive Spirometer use and practiced with patient. Best out of three attempts was 250. RT will continue to monitor.

## 2015-06-02 NOTE — Progress Notes (Signed)
Dr.Hollis at bedside --made aware of HTN--ordering Labetalol PRN. Will cont to monitor.

## 2015-06-02 NOTE — Interval H&P Note (Signed)
History and Physical Interval Note: no change in H and P  06/02/2015 7:14 AM  Sharion Balloon  has presented today for surgery, with the diagnosis of Enterocutaneous Fistula  The various methods of treatment have been discussed with the patient and family. After consideration of risks, benefits and other options for treatment, the patient has consented to  Procedure(s): EXPLORATORY LAPAROTOMY (N/A) SMALL BOWEL RESECTION (N/A) as a surgical intervention .  The patient's history has been reviewed, patient examined, no change in status, stable for surgery.  I have reviewed the patient's chart and labs.  Questions were answered to the patient's satisfaction.     Arlayne Liggins A

## 2015-06-02 NOTE — Op Note (Signed)
NAMENAVARRE, DIANA NO.:  [id number]  MEDICAL RECORD NO.:  [id number]  LOCATION:  2C12C                        FACILITY:  [place]  PHYSICIAN:  Coralie Keens, M.D. DATE OF BIRTH:  [birth year]  DATE OF PROCEDURE:  06/02/2015 DATE OF DISCHARGE:                              OPERATIVE REPORT   POSTOPERATIVE DIAGNOSIS:  Enterocutaneous fistula.  POSTOPERATIVE DIAGNOSIS:  Enterocutaneous fistula.  PROCEDURES: 1. Exploratory laparotomy. 2. Small bowel resection.  SURGEON:  Coralie Keens, M.D.  ASSISTANT:  Algis Greenhouse, RNFA.  ANESTHESIA:  General endotracheal anesthesia.  ESTIMATED BLOOD LOSS:  Minimal.  INDICATIONS:  This is a 69 year old gentleman with multiple medical problems.  He has had a previous exploratory laparotomy for an incarcerated umbilical hernia, which contained infarcted bowel.  At that time, he had undergone a small bowel resection.  More than 6 months out from surgery, he developed a small enterocutaneous fistula at the umbilicus.  There was minimal drainage from this on a daily basis.  He decided at that time to forego surgery because of his multiple medical issues.  He then subsequently developed breast cancer and underwent a mastectomy, and is undergoing treatment for breast cancer.  He has now developed increased drainage from the enterocutaneous fistula and started to develop excoriated skin as well.  Decision was made to proceed with exploration.  FINDINGS:  The fistula was from the very edge of the TA 60 staple line from his previous bowel anastomosis.  This had actually herniated up to a hernia at the previous incision and was adjacent to the skin.  The previous anastomosis was quite large, so I was just able to resect the previous TA 60 staple line and recreate another one with a single firing of the TA 60 staple line.  PROCEDURE IN DETAIL:  The patient was brought to the operating room, identified as [name].  He  was placed supine on the operating room table where general anesthesia was induced.  His abdomen was then prepped and draped in usual sterile fashion.  I performed an elliptical incision around the umbilicus incorporating the patient's previous scar. This was through a lot of excoriated skin.  I then took this down to the fascia circumferentially with electrocautery.  The patient had a hernia defect and I entered the sac and found the previous small bowel anastomosis, was tested to the hernia sac and one small corner of the TA 60 staple line.  The rest of the bowel was quite healthy in appearance. I was able to separate this from the overlying hernia sac and excised the previous skin and umbilicus.  I then took down the mesentery toward the TA 60 staple line with Kelly clamps and 2-0 silk ties and 2-0 silk suture ligatures.  I then used the TA 60 stapler to take down the fistula site.  The actual previous side-to-side anastomosis was quite long and widely patent.  The first stapler did not completely close the bowel side-to-side to go several more centimeters toward the previous anastomosis and again transected this area of the TA 60 staple line as well.  Again, I evaluated the previous anastomosis and then was again widely patent.  I sent  this fistula to Pathology for evaluation.  I then closed the mesenteric defect with interrupted silk sutures.  I then reinforced the TA 60 staple line with interrupted silk sutures as well. I again re-evaluated the anastomosis and again, it was widely patent. At this point, I placed everything back into the abdominal cavity.  I then closed the patient's fascia and previous hernia defect with both running #1 looped PDS suture and interrupted #1 Novafil sutures as well. Good closure of the fascia appeared to be achieved.  At this point, I elected to leave the skin open and packed it with a wet-to-dry saline gauze.  Dry gauze and ABD were then placed over  this.  The patient tolerated the procedure well.  All the counts were correct at the end of procedure.  The patient was then extubated in the operating room and taken in a stable condition to recovery room.     Coralie Keens, M.D.     DB/MEDQ  D:  06/02/2015  T:  06/02/2015  Job:  [id number]

## 2015-06-02 NOTE — Anesthesia Procedure Notes (Signed)
Procedure Name: Intubation Date/Time: 06/02/2015 9:26 AM Performed by: Neldon Newport Pre-anesthesia Checklist: Patient being monitored, Suction available, Emergency Drugs available, Patient identified and Timeout performed Patient Re-evaluated:Patient Re-evaluated prior to inductionOxygen Delivery Method: Circle system utilized Preoxygenation: Pre-oxygenation with 100% oxygen Intubation Type: IV induction Ventilation: Mask ventilation without difficulty Laryngoscope Size: Mac and 3 Grade View: Grade I Tube type: Oral Tube size: 7.5 mm Number of attempts: 1 Placement Confirmation: positive ETCO2,  ETT inserted through vocal cords under direct vision and breath sounds checked- equal and bilateral Secured at: 23 cm Tube secured with: Tape Dental Injury: Teeth and Oropharynx as per pre-operative assessment

## 2015-06-02 NOTE — Anesthesia Preprocedure Evaluation (Addendum)
Anesthesia Evaluation  Patient identified by MRN, date of birth, ID band Patient awake    Reviewed: Allergy & Precautions, NPO status , Patient's Chart, lab work & pertinent test results, reviewed documented beta blocker date and time   Airway Mallampati: II  TM Distance: >3 FB Neck ROM: full    Dental  (+) Dental Advidsory Given, Teeth Intact   Pulmonary shortness of breath and at rest, asthma , sleep apnea , COPDformer smoker,  breath sounds clear to auscultation        Cardiovascular hypertension, Pt. on medications and Pt. on home beta blockers + CAD, + CABG, + Peripheral Vascular Disease and +CHF Rhythm:regular Rate:Normal     Neuro/Psych PSYCHIATRIC DISORDERS Depression  Neuromuscular disease CVA    GI/Hepatic Neg liver ROS, GERD-  ,  Endo/Other  diabetes, Type 2, Insulin DependentHypothyroidism Morbid obesity  Renal/GU Renal diseasenegative Renal ROS     Musculoskeletal  (+) Arthritis -,   Abdominal   Peds  Hematology negative hematology ROS (+)   Anesthesia Other Findings   Reproductive/Obstetrics                           Anesthesia Physical Anesthesia Plan  ASA: III  Anesthesia Plan: General   Post-op Pain Management:    Induction: Intravenous  Airway Management Planned: Oral ETT  Additional Equipment:   Intra-op Plan:   Post-operative Plan: Extubation in OR  Informed Consent: I have reviewed the patients History and Physical, chart, labs and discussed the procedure including the risks, benefits and alternatives for the proposed anesthesia with the patient or authorized representative who has indicated his/her understanding and acceptance.   Dental advisory given and Dental Advisory Given  Plan Discussed with: CRNA and Anesthesiologist  Anesthesia Plan Comments:        Anesthesia Quick Evaluation

## 2015-06-03 ENCOUNTER — Encounter (HOSPITAL_COMMUNITY): Payer: Self-pay | Admitting: Surgery

## 2015-06-03 LAB — GLUCOSE, CAPILLARY
GLUCOSE-CAPILLARY: 120 mg/dL — AB (ref 65–99)
GLUCOSE-CAPILLARY: 162 mg/dL — AB (ref 65–99)
GLUCOSE-CAPILLARY: 166 mg/dL — AB (ref 65–99)
Glucose-Capillary: 119 mg/dL — ABNORMAL HIGH (ref 65–99)
Glucose-Capillary: 177 mg/dL — ABNORMAL HIGH (ref 65–99)

## 2015-06-03 LAB — BASIC METABOLIC PANEL
Anion gap: 9 (ref 5–15)
BUN: 24 mg/dL — AB (ref 6–20)
CALCIUM: 9 mg/dL (ref 8.9–10.3)
CO2: 32 mmol/L (ref 22–32)
CREATININE: 0.83 mg/dL (ref 0.61–1.24)
Chloride: 103 mmol/L (ref 101–111)
GFR calc non Af Amer: >60 mL/min (ref >60)
Glucose, Bld: 110 mg/dL — ABNORMAL HIGH (ref 65–99)
Potassium: 4.6 mmol/L (ref 3.5–5.1)
SODIUM: 144 mmol/L (ref 135–145)

## 2015-06-03 LAB — CBC
HCT: 43.9 % (ref 39.0–52.0)
HEMOGLOBIN: 12.3 g/dL — AB (ref 13.0–17.0)
MCH: 28.7 pg (ref 26.0–34.0)
MCHC: 28 g/dL — AB (ref 30.0–36.0)
MCV: 102.6 fL — ABNORMAL HIGH (ref 78.0–100.0)
PLATELETS: 138 10*3/uL — AB (ref 150–400)
RBC: 4.28 MIL/uL (ref 4.22–5.81)
RDW: 15.3 % (ref 11.5–15.5)
WBC: 12.7 10*3/uL — ABNORMAL HIGH (ref 4.0–10.5)

## 2015-06-03 MED ORDER — CETYLPYRIDINIUM CHLORIDE 0.05 % MT LIQD
7.0000 mL | Freq: Two times a day (BID) | OROMUCOSAL | Status: DC
  Administered 2015-06-03 – 2015-06-08 (×11): 7 mL via OROMUCOSAL

## 2015-06-03 NOTE — Progress Notes (Signed)
Utilization Review Completed.  

## 2015-06-03 NOTE — Progress Notes (Signed)
1 Day Post-Op  Subjective: POD#1 Awake and alert with minimal confusion Complains of incisional pain Denies nausea  Objective: Vital signs in last 24 hours: Temp:  [97.1 F (36.2 C)-98.5 F (36.9 C)] 97.9 F (36.6 C) (08/23 0400) Pulse Rate:  [73-104] 101 (08/23 0400) Resp:  [15-27] 19 (08/23 0400) BP: (130-198)/(72-119) 133/80 mmHg (08/23 0400) SpO2:  [85 %-100 %] 100 % (08/23 0400) FiO2 (%):  [45 %] 45 % (08/22 2000) Weight:  [109 kg (240 lb 4.8 oz)] 109 kg (240 lb 4.8 oz) (08/22 1825) Last BM Date:  (pt stated it was a week ago. )  Intake/Output from previous day: 08/22 0701 - 08/23 0700 In: 1148.8 [I.V.:1148.8] Out: 2475 [Urine:2375; Blood:100] Intake/Output this shift:    Lungs clear CV IRR Abdomen soft, dressing intact  Lab Results:   Recent Labs  06/02/15 0707 06/03/15 0409  WBC 11.6* 12.7*  HGB 13.4 12.3*  HCT 42.9 43.9  PLT 167 138*   BMET  Recent Labs  06/02/15 0707  NA 135  K 5.4*  CL 94*  CO2 32  GLUCOSE 195*  BUN 37*  CREATININE 0.83  CALCIUM 9.1   PT/INR No results for input(s): LABPROT, INR in the last 72 hours. ABG No results for input(s): PHART, HCO3 in the last 72 hours.  Invalid input(s): PCO2, PO2  Studies/Results: No results found.  Anti-infectives: Anti-infectives    Start     Dose/Rate Route Frequency Ordered Stop   06/02/15 0650  ceFAZolin (ANCEF) IVPB 2 g/50 mL premix     2 g 100 mL/hr over 30 Minutes Intravenous On call to O.R. 06/02/15 1638 06/02/15 0916      Assessment/Plan: s/p Procedure(s): EXPLORATORY LAPAROTOMY (N/A) SMALL BOWEL RESECTION (N/A)  Start clear liquid diet Start dressing changes PT consult Continue step down monitoring Leave foley in until tomorrow  LOS: 1 day    Denham Mose A 06/03/2015

## 2015-06-04 LAB — CBC
HEMATOCRIT: 41 % (ref 39.0–52.0)
HEMOGLOBIN: 12.5 g/dL — AB (ref 13.0–17.0)
MCH: 31.2 pg (ref 26.0–34.0)
MCHC: 30.5 g/dL (ref 30.0–36.0)
MCV: 102.2 fL — ABNORMAL HIGH (ref 78.0–100.0)
Platelets: 139 10*3/uL — ABNORMAL LOW (ref 150–400)
RBC: 4.01 MIL/uL — AB (ref 4.22–5.81)
RDW: 15.6 % — AB (ref 11.5–15.5)
WBC: 12.1 10*3/uL — AB (ref 4.0–10.5)

## 2015-06-04 LAB — BASIC METABOLIC PANEL
ANION GAP: 7 (ref 5–15)
BUN: 23 mg/dL — ABNORMAL HIGH (ref 6–20)
CALCIUM: 8.5 mg/dL — AB (ref 8.9–10.3)
CO2: 31 mmol/L (ref 22–32)
Chloride: 99 mmol/L — ABNORMAL LOW (ref 101–111)
Creatinine, Ser: 0.84 mg/dL (ref 0.61–1.24)
GFR calc non Af Amer: >60 mL/min (ref >60)
Glucose, Bld: 111 mg/dL — ABNORMAL HIGH (ref 65–99)
POTASSIUM: 4.5 mmol/L (ref 3.5–5.1)
Sodium: 137 mmol/L (ref 135–145)

## 2015-06-04 LAB — GLUCOSE, CAPILLARY
GLUCOSE-CAPILLARY: 115 mg/dL — AB (ref 65–99)
GLUCOSE-CAPILLARY: 223 mg/dL — AB (ref 65–99)
Glucose-Capillary: 168 mg/dL — ABNORMAL HIGH (ref 65–99)
Glucose-Capillary: 211 mg/dL — ABNORMAL HIGH (ref 65–99)

## 2015-06-04 NOTE — Progress Notes (Signed)
Report called to [id number] for patient. Will transfer patient. Called daughter to inform her of patient's transfer.

## 2015-06-04 NOTE — Progress Notes (Signed)
2 Days Post-Op  Subjective: No complaints Passing flatus  Objective: Vital signs in last 24 hours: Temp:  [97.5 F (36.4 C)-98.6 F (37 C)] 98.3 F (36.8 C) (08/24 0300) Pulse Rate:  [76-126] 93 (08/24 0300) Resp:  [17-27] 24 (08/24 0300) BP: (130-171)/(79-106) 153/80 mmHg (08/24 0300) SpO2:  [94 %-100 %] 99 % (08/24 0300) Last BM Date:  (pt states he is unsure of last BM)  Intake/Output from previous day: 08/23 0701 - 08/24 0700 In: 945 [I.V.:945] Out: 2950 [Urine:2950] Intake/Output this shift:    Abdomen soft, non distended, skin looks better  Lab Results:   Recent Labs  06/03/15 0409 06/04/15 0242  WBC 12.7* 12.1*  HGB 12.3* 12.5*  HCT 43.9 41.0  PLT 138* 139*   BMET  Recent Labs  06/03/15 0745 06/04/15 0242  NA 144 137  K 4.6 4.5  CL 103 99*  CO2 32 31  GLUCOSE 110* 111*  BUN 24* 23*  CREATININE 0.83 0.84  CALCIUM 9.0 8.5*   PT/INR No results for input(s): LABPROT, INR in the last 72 hours. ABG No results for input(s): PHART, HCO3 in the last 72 hours.  Invalid input(s): PCO2, PO2  Studies/Results: No results found.  Anti-infectives: Anti-infectives    Start     Dose/Rate Route Frequency Ordered Stop   06/02/15 0650  ceFAZolin (ANCEF) IVPB 2 g/50 mL premix     2 g 100 mL/hr over 30 Minutes Intravenous On call to O.R. 06/02/15 [id number] 06/02/15 0916      Assessment/Plan: s/p Procedure(s): EXPLORATORY LAPAROTOMY (N/A) SMALL BOWEL RESECTION (N/A)  Transfer to floor Change dressing. Nursing has not done it despite the order to change the dressing D/c foley Advance diet  LOS: 2 days    Angelik Walls A 06/04/2015

## 2015-06-05 LAB — GLUCOSE, CAPILLARY
GLUCOSE-CAPILLARY: 112 mg/dL — AB (ref 65–99)
GLUCOSE-CAPILLARY: 141 mg/dL — AB (ref 65–99)
GLUCOSE-CAPILLARY: 178 mg/dL — AB (ref 65–99)
GLUCOSE-CAPILLARY: 179 mg/dL — AB (ref 65–99)
GLUCOSE-CAPILLARY: 89 mg/dL (ref 65–99)
Glucose-Capillary: 185 mg/dL — ABNORMAL HIGH (ref 65–99)
Glucose-Capillary: 114 mg/dL — ABNORMAL HIGH (ref 65–99)
Glucose-Capillary: 182 mg/dL — ABNORMAL HIGH (ref 65–99)
Glucose-Capillary: 189 mg/dL — ABNORMAL HIGH (ref 65–99)

## 2015-06-05 MED ORDER — INSULIN ASPART 100 UNIT/ML ~~LOC~~ SOLN
0.0000 [IU] | Freq: Three times a day (TID) | SUBCUTANEOUS | Status: DC
  Administered 2015-06-05 – 2015-06-06 (×4): 4 [IU] via SUBCUTANEOUS
  Administered 2015-06-07: 3 [IU] via SUBCUTANEOUS
  Administered 2015-06-07 (×2): 4 [IU] via SUBCUTANEOUS
  Administered 2015-06-08 (×2): 7 [IU] via SUBCUTANEOUS
  Administered 2015-06-09: 4 [IU] via SUBCUTANEOUS
  Administered 2015-06-09: 3 [IU] via SUBCUTANEOUS

## 2015-06-05 MED ORDER — INSULIN ASPART 100 UNIT/ML ~~LOC~~ SOLN
0.0000 [IU] | Freq: Every day | SUBCUTANEOUS | Status: DC
  Administered 2015-06-06: 2 [IU] via SUBCUTANEOUS

## 2015-06-05 MED ORDER — PANTOPRAZOLE SODIUM 40 MG PO TBEC
40.0000 mg | DELAYED_RELEASE_TABLET | Freq: Every day | ORAL | Status: DC
  Administered 2015-06-05 – 2015-06-08 (×4): 40 mg via ORAL
  Filled 2015-06-05 (×5): qty 1

## 2015-06-05 NOTE — Progress Notes (Signed)
3 Days Post-Op  Subjective: Comfortable Tolerating full liquids but does not want to try regular food yet  Objective: Vital signs in last 24 hours: Temp:  [97.5 F (36.4 C)-98.7 F (37.1 C)] 97.8 F (36.6 C) (08/25 0230) Pulse Rate:  [79-102] 86 (08/25 0230) Resp:  [18-22] 20 (08/25 0230) BP: (105-150)/(53-83) 144/72 mmHg (08/25 0230) SpO2:  [92 %-100 %] 92 % (08/25 0230) Weight:  [118.9 kg (262 lb 2 oz)] 118.9 kg (262 lb 2 oz) (08/24 2051) Last BM Date:  (pt unsure)  Intake/Output from previous day: 08/24 0701 - 08/25 0700 In: 4042.9 [I.V.:4042.9] Out: 1100 [Urine:1100] Intake/Output this shift: Total I/O In: 3335 [I.V.:3335] Out: 300 [Urine:300]  Abdomen soft, wound clean, skin looking better Lungs clear  Lab Results:   Recent Labs  06/03/15 0409 06/04/15 0242  WBC 12.7* 12.1*  HGB 12.3* 12.5*  HCT 43.9 41.0  PLT 138* 139*   BMET  Recent Labs  06/03/15 0745 06/04/15 0242  NA 144 137  K 4.6 4.5  CL 103 99*  CO2 32 31  GLUCOSE 110* 111*  BUN 24* 23*  CREATININE 0.83 0.84  CALCIUM 9.0 8.5*   PT/INR No results for input(s): LABPROT, INR in the last 72 hours. ABG No results for input(s): PHART, HCO3 in the last 72 hours.  Invalid input(s): PCO2, PO2  Studies/Results: No results found.  Anti-infectives: Anti-infectives    Start     Dose/Rate Route Frequency Ordered Stop   06/02/15 0650  ceFAZolin (ANCEF) IVPB 2 g/50 mL premix     2 g 100 mL/hr over 30 Minutes Intravenous On call to O.R. 06/02/15 7948 06/02/15 0916      Assessment/Plan: s/p Procedure(s): EXPLORATORY LAPAROTOMY (N/A) SMALL BOWEL RESECTION (N/A)  Out of bed PT Keep of fulls Continue wound care  LOS: 3 days    Yarima Penman A 06/05/2015

## 2015-06-05 NOTE — Anesthesia Postprocedure Evaluation (Signed)
  Anesthesia Post-op Note  Patient: [name]  Procedure(s) Performed: Procedure(s): EXPLORATORY LAPAROTOMY (N/A) SMALL BOWEL RESECTION (N/A)  Patient Location: PACU  Anesthesia Type:General  Level of Consciousness: awake and alert   Airway and Oxygen Therapy: Patient Spontanous Breathing  Post-op Pain: moderate  Post-op Assessment: Post-op Vital signs reviewed              Post-op Vital Signs: Reviewed  Last Vitals:  Filed Vitals:   06/05/15 0948  BP: 138/76  Pulse: 86  Temp: 36.9 C  Resp: 18    Complications: No apparent anesthesia complications

## 2015-06-05 NOTE — Care Management Important Message (Signed)
Important Message  Patient Details  Name: [name] MRN: [id number] Date of Birth: 1945/10/26   Medicare Important Message Given:  Yes-second notification given    Delorse Lek 06/05/2015, 12:06 PM

## 2015-06-05 NOTE — Evaluation (Signed)
Physical Therapy Evaluation Patient Details Name: [name] MRN: [id number] DOB: 1945/11/26 Today's Date: 06/05/2015   History of Present Illness  small bowel resection  Clinical Impression  Patient requiring moderate assistance with bed mobility including both in/out of bed. Current gait utilizing min assistance with rw. Patient reports feeling fatigued after ambulation in room. Patient noted to be confused during evaluation and level of assistance at home is unclear at this time. Patient does report that he wants to be able to go home. At current level of mobility patient is needing assistance with getting in/out of bed and with ambulation. This may change as the patient's mobility improves.      Follow Up Recommendations Other (comment) (to be determined)    Equipment Recommendations  None recommended by PT (patient reports having rw at home.)    Recommendations for Other Services       Precautions / Restrictions Precautions Precautions: Fall Required Braces or Orthoses: Other Brace/Splint Other Brace/Splint: abdominal binder Restrictions Weight Bearing Restrictions: No      Mobility  Bed Mobility Overal bed mobility: Needs Assistance Bed Mobility: Supine to Sit;Sit to Supine;Rolling Rolling: Min assist   Supine to sit: Mod assist (assist at trunk) Sit to supine: Mod assist (assist with bilateral LEs)   General bed mobility comments: provided abdominal binder not correct size, notified nursing. cleared with nursing to get up without binder.   Transfers Overall transfer level: Needs assistance Equipment used: Rolling walker (2 wheeled) Transfers: Sit to/from Stand Sit to Stand: Min assist         General transfer comment: cues for hand placement location and balance with initial stand  Ambulation/Gait Ambulation/Gait assistance: Min assist Ambulation Distance (Feet): 10 Feet Assistive device: Rolling walker (2 wheeled) Gait Pattern/deviations:  Step-through pattern;Decreased step length - right;Decreased step length - left Gait velocity: decreased   General Gait Details: occasional assist with rw positioning. SpO2 94% sitting and 88% after ambulation, 92% before leaving room.   Stairs            Wheelchair Mobility    Modified Rankin (Stroke Patients Only)       Balance Overall balance assessment: Needs assistance Sitting-balance support: Single extremity supported Sitting balance-Leahy Scale: Poor     Standing balance support: Bilateral upper extremity supported Standing balance-Leahy Scale: Poor Standing balance comment: using rw                             Pertinent Vitals/Pain Pain Assessment: 0-10 Pain Score: 3  Pain Location: abdomin Pain Descriptors / Indicators: Sore Pain Intervention(s): Monitored during session    Home Living Family/patient expects to be discharged to:: Private residence Living Arrangements: Children;Other relatives Available Help at Discharge: Family Type of Home: House Home Access: Stairs to enter Entrance Stairs-Rails: Psychiatric nurse of Steps: 3 Home Layout: One level Home Equipment: Environmental consultant - 2 wheels Additional Comments: Patient with noted confusion, reliability of subjective reports unknown    Prior Function Level of Independence: Independent with assistive device(s)         Comments: using rolling walker     Hand Dominance        Extremity/Trunk Assessment               Lower Extremity Assessment: Generalized weakness         Communication   Communication: No difficulties  Cognition Arousal/Alertness: Awake/alert Behavior During Therapy: WFL for tasks assessed/performed Overall Cognitive Status: No  family/caregiver present to determine baseline cognitive functioning (confusion noted during session) Area of Impairment: Orientation;Awareness Orientation Level: Disoriented to;Place             General  Comments: Patient unable to recall where he is and why he is here.     General Comments      Exercises        Assessment/Plan    PT Assessment Patient needs continued PT services  PT Diagnosis Difficulty walking;Generalized weakness;Acute pain   PT Problem List Decreased strength;Decreased activity tolerance;Decreased balance;Decreased mobility;Decreased cognition;Decreased knowledge of use of DME;Decreased safety awareness;Pain  PT Treatment Interventions DME instruction;Gait training;Stair training;Functional mobility training;Therapeutic activities;Therapeutic exercise;Balance training;Patient/family education   PT Goals (Current goals can be found in the Care Plan section) Acute Rehab PT Goals Patient Stated Goal: return home PT Goal Formulation: With patient Time For Goal Achievement: 06/19/15 Potential to Achieve Goals: Fair    Frequency Min 3X/week   Barriers to discharge        Co-evaluation               End of Session Equipment Utilized During Treatment: Gait belt Activity Tolerance: Patient limited by fatigue Patient left: in bed;with call bell/phone within reach (per nursing request for dressing change) Nurse Communication: Mobility status;Other (comment) (need for larger abdominal binder)         Time: 1245-8099 PT Time Calculation (min) (ACUTE ONLY): 25 min   Charges:     PT Treatments $Therapeutic Activity: 8-22 mins   PT G Codes:        Cassell Clement, PT, CSCS Pager [phone] Office [phone]  06/05/2015, 2:38 PM

## 2015-06-06 LAB — GLUCOSE, CAPILLARY
Glucose-Capillary: 103 mg/dL — ABNORMAL HIGH (ref 65–99)
Glucose-Capillary: 194 mg/dL — ABNORMAL HIGH (ref 65–99)
Glucose-Capillary: 152 mg/dL — ABNORMAL HIGH (ref 65–99)
Glucose-Capillary: 201 mg/dL — ABNORMAL HIGH (ref 65–99)

## 2015-06-06 MED ORDER — SIMETHICONE 80 MG PO CHEW
80.0000 mg | CHEWABLE_TABLET | Freq: Four times a day (QID) | ORAL | Status: DC | PRN
  Administered 2015-06-06: 80 mg via ORAL
  Filled 2015-06-06: qty 1

## 2015-06-06 NOTE — Consult Note (Signed)
Physical Medicine and Rehabilitation Consult  Reason for Consult: Deconditioning Referring Physician: Dr. Ninfa Linden.    HPI: [name] is a 69 y.o. male with history of COPD, CAD, PAF- no coumadin due to ETOH use, cerebellar CVA, incarcerated hernia with SB resection 01/1286 complicated by [facility] fistula which has not resolved after conservative management. he was admitted on 06/02/15 for Exp Lap with small bowel resection by Dr. Ninfa Linden.  He has been advanced to soft diet and continues to have confusion post op.  Therapy initiated and patient noted to be deconditioned. CIR recommended for follow up therapy.    Review of Systems  HENT: Negative for hearing loss.   Eyes: Positive for blurred vision (left eye ).  Respiratory: Positive for shortness of breath (occasionally). Negative for cough and wheezing.        Uses oxygen at nights  Cardiovascular: Negative for chest pain and palpitations.  Gastrointestinal: Positive for abdominal pain and constipation (chronic). Negative for nausea and vomiting.  Musculoskeletal: Positive for back pain and joint pain. Negative for myalgias.  Neurological: Positive for dizziness and sensory change. Negative for headaches.  Psychiatric/Behavioral: The patient has insomnia.       Past Medical History  Diagnosis Date  . CHRONIC OBSTRUCTIVE PULMONARY DISEASE 06/20/2009  . CAROTID STENOSIS 06/20/2009    A. 08/2001 s/p L CEA;  B.   09/14/11 - Carotid U/S - 40-59% bilateral stenosis, left CEA patch angioplasty is patent  . DM 06/20/2009  . CAD 06/20/2009    A.  08/2000 - s/p CABG x 4 - LIMA-LAD, Left Radial-OM, VG-DIAG, VG-RCA;  B. Neg. MV  2010  . HYPERLIPIDEMIA 06/20/2009  . HYPERTENSION 06/20/2009  . Hypothyroidism   . Low back pain   . Asthma     as child  . Pneumonia   . Persistent atrial fibrillation     Not felt to be coumadin candidate 2/2 ETOH use.  Marland Kitchen History of tobacco abuse     remote - quit 1970  . Morbidly obese   . Chronic  diastolic heart failure, NYHA class 3     Followed by [facility] --> Echo 04/2014: EF 86-76%, Diastolic DysFxn - elevated LVEDP & LAP. Mod Dil LA & RA.  . Falls frequently   . Hx of cardiovascular stress test 05/2014    Lexiscan Myoview (8/15):  No ischemia; EF 63% - Normal Study  . Breast cancer, right breast   . Gout   . OBSTRUCTIVE SLEEP APNEA 06/20/2009    does not use cpap  . Stroke     found on MRI in Oct. 2015.   Marland Kitchen Shortness of breath dyspnea     occasional  . Depression     takes Prozac  . GERD (gastroesophageal reflux disease)     takes Prilosec  . Arthritis   . Neuromuscular disorder     neuropathy in both legs  . Constipation   . ETOH abuse     no alcohol since Oct. 2015  . 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 CHF (congestive heart failure)   . Supplemental oxygen dependent     hs  . Bilateral renal cysts   . Kidney disease     Stage 3    Past Surgical History  Procedure Laterality Date  . Carotid endarterectomy  2002    left  . Coronary artery bypass graft      x  4 - 2001  . Umbilical hernia repair N/A 01/28/2014    Procedure: HERNIA REPAIR UMBILICAL ADULT/INC;  Surgeon: Harl Bowie, MD;  Location: [place];  Service: General;  Laterality: N/A;  . Laparotomy N/A 01/28/2014    Procedure: EXPLORATORY LAPAROTOMY;  Surgeon: Harl Bowie, MD;  Location: [place];  Service: General;  Laterality: N/A;  . Bowel resection N/A 01/28/2014    Procedure: SMALL BOWEL RESECTION;  Surgeon: Harl Bowie, MD;  Location: [place];  Service: General;  Laterality: N/A;  . Hernia repair    . Colonoscopy    . Total mastectomy Right 11/11/2014    Procedure: RIGHT TOTAL MASTECTOMY;  Surgeon: Excell Seltzer, MD;  Location: [place];  Service: General;  Laterality: Right;  . Cataract extraction w/phaco Left 04/08/2015    Procedure: CATARACT EXTRACTION PHACO AND INTRAOCULAR LENS PLACEMENT ([place]);  Surgeon: Birder Robson, MD;   Location: [facility];  Service: Ophthalmology;  Laterality: Left;  [place] 00:36 AP% 23.7 CDE 8.72 fluid pack VOH#[id number] H  . Eye surgery Left     Catarct  . Laparotomy N/A 06/02/2015    Procedure: EXPLORATORY LAPAROTOMY;  Surgeon: Coralie Keens, MD;  Location: [place];  Service: General;  Laterality: N/A;  . Bowel resection N/A 06/02/2015    Procedure: SMALL BOWEL RESECTION;  Surgeon: Coralie Keens, MD;  Location: [facility];  Service: General;  Laterality: N/A;    Family History  Problem Relation Age of Onset  . Stroke Mother     ?  Marland Kitchen Cancer Mother     unknown type of cancer  . Stroke Father     ?  Marland Kitchen Heart attack Neg Hx   . Cancer Brother 75    stomach cancer    Social History:  Lives with daughter. He reports that he quit smoking about 46 years ago. He has never used smokeless tobacco. He reports that he drinks a pint of alcohol  daily. Per reports that he uses illicit drugs (Marijuana).    Allergies  Allergen Reactions  . Erythromycin Hives    Oramycin    Medications Prior to Admission  Medication Sig Dispense Refill  . allopurinol (ZYLOPRIM) 300 MG tablet Take 1 tablet (300 mg total) by mouth daily. 30 tablet 3  . aspirin EC 325 MG EC tablet Take 1 tablet (325 mg total) by mouth daily. 30 tablet 0  . atorvastatin (LIPITOR) 20 MG tablet Take 1 tablet (20 mg total) by mouth daily. 30 tablet 1  . carvedilol (COREG) 3.125 MG tablet Take 1 tablet (3.125 mg total) by mouth 2 (two) times daily with a meal. 60 tablet 5  . colchicine 0.6 MG tablet Take 0.6 mg by mouth daily.    Marland Kitchen FLUoxetine (PROZAC) 20 MG capsule Take 1 capsule (20 mg total) by mouth daily. 30 capsule 3  . fluticasone (FLOVENT HFA) 110 MCG/ACT inhaler Inhale 2 puffs into the lungs 2 (two) times daily.    . furosemide (LASIX) 80 MG tablet Take 1 tablet (80 mg total) by mouth 2 (two) times daily. 90 tablet 0  . gabapentin (NEURONTIN) 300 MG capsule Take 1 capsule (300 mg total) by mouth 2 (two) times daily. 60 capsule 1    . insulin glargine (LANTUS) 100 UNIT/ML injection Inject 14 Units into the skin at bedtime.    Marland Kitchen levothyroxine (SYNTHROID, LEVOTHROID) 125 MCG tablet Take 1.5 tablets (187 mcg total) by mouth daily before breakfast. 30 tablet 3  . magnesium oxide (MAG-OX) 400 (241.3 MG) MG tablet Take  0.5 tablets (200 mg total) by mouth 2 (two) times daily. 30 tablet 0  . omeprazole (PRILOSEC) 20 MG capsule Take 1 capsule (20 mg total) by mouth daily. 30 capsule 1  . Oxycodone HCl 10 MG TABS Take 1 tablet (10 mg total) by mouth every 6 (six) hours as needed. 40 tablet 0  . potassium chloride SA (KLOR-CON M20) 20 MEQ tablet Take 2 tablets (40 mEq total) by mouth 2 (two) times daily. 120 tablet 0  . predniSONE (DELTASONE) 20 MG tablet Take 2 tablets (40 mg total) by mouth daily with breakfast. 6 tablet 0  . tamoxifen (NOLVADEX) 20 MG tablet Take 1 tablet (20 mg total) by mouth daily. 90 tablet 3  . venlafaxine XR (EFFEXOR-XR) 37.5 MG 24 hr capsule TAKE ONE CAPSULE BY MOUTH DAILY 30 capsule 3  . folic acid (FOLVITE) 1 MG tablet Take 1 tablet (1 mg total) by mouth daily. 30 tablet 1    Home: Home Living Family/patient expects to be discharged to:: Private residence Living Arrangements: Children, Other relatives Available Help at Discharge: Family Type of Home: House Home Access: Stairs to enter Technical brewer of Steps: 3 Entrance Stairs-Rails: Right, Left Home Layout: One level Home Equipment: Walker - 2 wheels Additional Comments: Patient with noted confusion, reliability of subjective reports unknown  Functional History: Prior Function Level of Independence: Independent with assistive device(s) Comments: using rolling walker Functional Status:  Mobility: Bed Mobility Overal bed mobility: Needs Assistance Bed Mobility: Rolling, Sidelying to Sit Rolling: Min assist Sidelying to sit: Mod assist Supine to sit: Mod assist (assist at trunk) Sit to supine: Mod assist (assist with bilateral  LEs) General bed mobility comments: assist for legs off bed, pt able to lift trunk with rail Transfers Overall transfer level: Needs assistance Equipment used: Rolling walker (2 wheeled) Transfers: Sit to/from Stand Sit to Stand: Mod assist, +2 physical assistance Stand pivot transfers: Mod assist General transfer comment: patient requesting second helper so paged nuse tech who came to help pt stand so he could urinate with assist with urinal, then sat back on bed and chair brought closer, so assisted with stand pivot with walker to chair with increased time and mod cues Ambulation/Gait Ambulation/Gait assistance: Min assist Ambulation Distance (Feet): 10 Feet Assistive device: Rolling walker (2 wheeled) Gait Pattern/deviations: Step-through pattern, Decreased step length - right, Decreased step length - left General Gait Details: occasional assist with rw positioning. SpO2 94% sitting and 88% after ambulation, 92% before leaving room.  Gait velocity: decreased    ADL:    Cognition: Cognition Overall Cognitive Status: No family/caregiver present to determine baseline cognitive functioning Orientation Level: Oriented to person, Oriented to place, Oriented to time Cognition Arousal/Alertness: Awake/alert Behavior During Therapy: WFL for tasks assessed/performed Overall Cognitive Status: No family/caregiver present to determine baseline cognitive functioning Area of Impairment: Orientation, Memory, Problem solving Orientation Level: Disoriented to, Place, Time Memory: Decreased short-term memory Problem Solving: Slow processing, Requires verbal cues, Requires tactile cues General Comments: awoke from sleep and was more confused thinking he was driving a dump truck, better after re-orientation, but then kept repeating questions about location and time throughout session  Blood pressure 111/58, pulse 80, temperature 97.7 F (36.5 C), temperature source Oral, resp. rate 18, height 5\' 8"   (1.727 m), weight 120.3 kg (265 lb 3.4 oz), SpO2 95 %. Physical Exam  Vitals reviewed. Constitutional: He is oriented to person, place, and time. He appears well-developed and well-nourished. He is easily aroused.  Fatigued and kept falling  asleep  HENT:  Head: Normocephalic and atraumatic.  Eyes: Conjunctivae are normal. Pupils are equal, round, and reactive to light.  Neck: Normal range of motion. Neck supple.  Cardiovascular: Normal rate and regular rhythm.   Respiratory: Effort normal and breath sounds normal. No respiratory distress. He has no wheezes.  GI: Soft. Bowel sounds are normal. He exhibits no distension. There is tenderness.  Musculoskeletal: He exhibits edema.  Neurological: He is alert, oriented to person, place, and time and easily aroused.  Speech clear. Able to follow simple commands.   Skin: Skin is warm and dry. No rash noted. No erythema.    Results for orders placed or performed during the hospital encounter of 06/02/15 (from the past 24 hour(s))  Glucose, capillary     Status: Abnormal   Collection Time: 06/05/15  6:05 PM  Result Value Ref Range   Glucose-Capillary 179 (H) 65 - 99 mg/dL  Glucose, capillary     Status: Abnormal   Collection Time: 06/05/15 10:50 PM  Result Value Ref Range   Glucose-Capillary 189 (H) 65 - 99 mg/dL   Comment 1 Notify RN   Glucose, capillary     Status: Abnormal   Collection Time: 06/06/15  8:28 AM  Result Value Ref Range   Glucose-Capillary 103 (H) 65 - 99 mg/dL  Glucose, capillary     Status: Abnormal   Collection Time: 06/06/15 12:39 PM  Result Value Ref Range   Glucose-Capillary 194 (H) 65 - 99 mg/dL   No results found.  Assessment/Plan: Diagnosis: debility and encephalopathy after small bowel resection 1. Does the need for close, 24 hr/day medical supervision in concert with the patient's rehab needs make it unreasonable for this patient to be served in a less intensive setting? Yes 2. Co-Morbidities requiring  supervision/potential complications: wounds, dm, htn, chf, copd 3. Due to bladder management, bowel management, safety, skin/wound care, disease management, medication administration, pain management and patient education, does the patient require 24 hr/day rehab nursing? Yes 4. Does the patient require coordinated care of a physician, rehab nurse, PT (1-2 hrs/day, 5 days/week), OT (1-2 hrs/day, 5 days/week) and SLP (potentially 1-2 hrs/day, 5 days/week) to address physical and functional deficits in the context of the above medical diagnosis(es)? Yes Addressing deficits in the following areas: balance, endurance, locomotion, strength, transferring, bowel/bladder control, bathing, dressing, feeding, grooming, toileting, cognition and psychosocial support 5. Can the patient actively participate in an intensive therapy program of at least 3 hrs of therapy per day at least 5 days per week? Yes 6. The potential for patient to make measurable gains while on inpatient rehab is excellent 7. Anticipated functional outcomes upon discharge from inpatient rehab are modified independent  with PT, modified independent and supervision with OT, modified independent and supervision with SLP. 8. Estimated rehab length of stay to reach the above functional goals is: 8-12 days 9. Does the patient have adequate social supports and living environment to accommodate these discharge functional goals? Yes 10. Anticipated D/C setting: Home 11. Anticipated post D/C treatments: [place] therapy 12. Overall Rehab/Functional Prognosis: excellent  RECOMMENDATIONS: This patient's condition is appropriate for continued rehabilitative care in the following setting: CIR Patient has agreed to participate in recommended program. Potentially Note that insurance prior authorization may be required for reimbursement for recommended care.  Comment: Rehab Admissions Coordinator to follow up.  Thanks,  [name], MD,  Mellody Drown     06/06/2015

## 2015-06-06 NOTE — Progress Notes (Signed)
Physical Therapy Treatment Patient Details Name: [name] MRN: [id number] DOB: Jun 17, 1946 Today's Date: 06/06/2015    History of Present Illness PAtient is a 69 y.o. male admitted with hx of DM/ CAD/ CABG, DM2, HTN, gout, HL, etoh abuse, afib. 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 In Oct '15 had gait dysfunction and cerebellar CVA. Hx etoh abuse. In Feb '16 had R mastectomy for male breast cancer. Presented with generalized weakness, s/p exp lap for SB resection 06/02/15.    PT Comments    Patient presents with moderate level of assist needed for mobility at this time.  Family able to provide some assist at home, feel safety and level of assistance will greatly improve with post acute rehab prior to d/c home.   Follow Up Recommendations  CIR     Equipment Recommendations  None recommended by PT    Recommendations for Other Services Rehab consult     Precautions / Restrictions Precautions Precautions: Fall Required Braces or Orthoses: Other Brace/Splint Other Brace/Splint: abdominal binder Restrictions Weight Bearing Restrictions: No    Mobility  Bed Mobility Overal bed mobility: Needs Assistance Bed Mobility: Rolling;Sidelying to Sit Rolling: Min assist Sidelying to sit: Mod assist       General bed mobility comments: assist for legs off bed, pt able to lift trunk with rail  Transfers Overall transfer level: Needs assistance Equipment used: Rolling walker (2 wheeled)   Sit to Stand: Mod assist;+2 physical assistance Stand pivot transfers: Mod assist       General transfer comment: patient requesting second helper so paged nuse tech who came to help pt stand so he could urinate with assist with urinal, then sat back on bed and chair brought closer, so assisted with stand pivot with walker to chair with increased time and mod cues  Ambulation/Gait                 Stairs            Wheelchair  Mobility    Modified Rankin (Stroke Patients Only)       Balance   Sitting-balance support: Feet supported Sitting balance-Leahy Scale: Fair Sitting balance - Comments: sitting without UE support about 6 minutes Postural control: Posterior lean Standing balance support: Bilateral upper extremity supported Standing balance-Leahy Scale: Poor Standing balance comment: UE support and min/mod assist for balance with posterior lean; stood about 2 minutes prepping for and urinating                    Cognition Arousal/Alertness: Awake/alert Behavior During Therapy: WFL for tasks assessed/performed Overall Cognitive Status: No family/caregiver present to determine baseline cognitive functioning Area of Impairment: Orientation;Memory;Problem solving Orientation Level: Disoriented to;Place;Time   Memory: Decreased short-term memory       Problem Solving: Slow processing;Requires verbal cues;Requires tactile cues General Comments: awoke from sleep and was more confused thinking he was driving a dump truck, better after re-orientation, but then kept repeating questions about location and time throughout session    Exercises      General Comments        Pertinent Vitals/Pain Faces Pain Scale: Hurts even more Pain Location: abdomen Pain Descriptors / Indicators: Aching Pain Intervention(s): Monitored during session    Home Living                      Prior Function  PT Goals (current goals can now be found in the care plan section) Progress towards PT goals: Progressing toward goals    Frequency       PT Plan Discharge plan needs to be updated    Co-evaluation             End of Session Equipment Utilized During Treatment: Gait belt Activity Tolerance: Patient limited by fatigue Patient left: in chair;with call bell/phone within reach     Time: 1208-1244 PT Time Calculation (min) (ACUTE ONLY): 36 min  Charges:  $Therapeutic  Activity: 23-37 mins                    G Codes:      [name],[name] 06/15/2015, 1:15 PM  Magda Kiel, [place] 06-15-2015

## 2015-06-06 NOTE — Progress Notes (Signed)
Inpatient Rehabilitation  Patient was screened by Gerlean Ren for appropriateness for an Inpatient Acute Rehab consult. Note PT is recommending CIR.  At this time, we are recommending Inpatient Rehab consult.  Please order when you feel appropriate.   [place] Admissions Coordinator Cell [phone] Office [phone]

## 2015-06-06 NOTE — Progress Notes (Signed)
4 Days Post-Op  Subjective: Comfortable No SOB  Objective: Vital signs in last 24 hours: Temp:  [97.7 F (36.5 C)-98.8 F (37.1 C)] 97.7 F (36.5 C) (08/26 0705) Pulse Rate:  [72-92] 80 (08/26 0705) Resp:  [18-20] 18 (08/26 0705) BP: (111-138)/(58-76) 111/58 mmHg (08/26 0705) SpO2:  [82 %-99 %] 97 % (08/26 0705) Last BM Date:  (pt unsure)  Intake/Output from previous day: 08/25 0701 - 08/26 0700 In: 1053 [P.O.:300; I.V.:753] Out: 750 [Urine:750] Intake/Output this shift:    Lungs clear Abdomen soft, wound clean  Lab Results:   Recent Labs  06/04/15 0242  WBC 12.1*  HGB 12.5*  HCT 41.0  PLT 139*   BMET  Recent Labs  06/04/15 0242  NA 137  K 4.5  CL 99*  CO2 31  GLUCOSE 111*  BUN 23*  CREATININE 0.84  CALCIUM 8.5*   PT/INR No results for input(s): LABPROT, INR in the last 72 hours. ABG No results for input(s): PHART, HCO3 in the last 72 hours.  Invalid input(s): PCO2, PO2  Studies/Results: No results found.  Anti-infectives: Anti-infectives    Start     Dose/Rate Route Frequency Ordered Stop   06/02/15 0650  ceFAZolin (ANCEF) IVPB 2 g/50 mL premix     2 g 100 mL/hr over 30 Minutes Intravenous On call to O.R. 06/02/15 0650 06/02/15 0916      Assessment/Plan: s/p Procedure(s): EXPLORATORY LAPAROTOMY (N/A) SMALL BOWEL RESECTION (N/A)  Soft diet Continue wound care, PT Arrange home health Hopefully home monday  LOS: 4 days    Anival Pasha A 06/06/2015

## 2015-06-07 ENCOUNTER — Encounter (HOSPITAL_COMMUNITY): Payer: Self-pay

## 2015-06-07 LAB — GLUCOSE, CAPILLARY
GLUCOSE-CAPILLARY: 148 mg/dL — AB (ref 65–99)
GLUCOSE-CAPILLARY: 148 mg/dL — AB (ref 65–99)
Glucose-Capillary: 165 mg/dL — ABNORMAL HIGH (ref 65–99)
Glucose-Capillary: 138 mg/dL — ABNORMAL HIGH (ref 65–99)
Glucose-Capillary: 151 mg/dL — ABNORMAL HIGH (ref 65–99)

## 2015-06-07 NOTE — Progress Notes (Signed)
5 Days Post-Op  Subjective: Tolerating diet.  Passing lots of flatus but no stool yet. Having moderate difficulty with ambulation. Mild confusion, otherwise stable and alert. Deconditioned. Seen by inpatient rehabilitation service and they feel that he is appropriate for CIR due to need for 24-hour nursing.  They feel that his potential to make measurable gains is excellent.  They anticipate eventual discharge home.  Objective: Vital signs in last 24 hours: Temp:  [97.5 F (36.4 C)-97.9 F (36.6 C)] 97.9 F (36.6 C) (08/27 0600) Pulse Rate:  [73-85] 85 (08/27 0600) Resp:  [20] 20 (08/27 0600) BP: (120-121)/(63-88) 120/88 mmHg (08/27 0600) SpO2:  [91 %-97 %] 97 % (08/27 0600) FiO2 (%):  [28 %] 28 % (08/26 0957) Last BM Date: 06/01/15  Intake/Output from previous day: 08/26 0701 - 08/27 0700 In: 1083.5 [P.O.:360; I.V.:723.5] Out: 550 [Urine:550] Intake/Output this shift:    General appearance: Obese.  Cooperative.  Mild confusion.  No agitation Resp: clear to auscultation bilaterally GI: Soft.  Not distended.  Obese.  Wound clean.  Lab Results:  Results for orders placed or performed during the hospital encounter of 06/02/15 (from the past 24 hour(s))  Glucose, capillary     Status: Abnormal   Collection Time: 06/06/15 12:39 PM  Result Value Ref Range   Glucose-Capillary 194 (H) 65 - 99 mg/dL  Glucose, capillary     Status: Abnormal   Collection Time: 06/06/15  5:09 PM  Result Value Ref Range   Glucose-Capillary 152 (H) 65 - 99 mg/dL  Glucose, capillary     Status: Abnormal   Collection Time: 06/06/15  9:42 PM  Result Value Ref Range   Glucose-Capillary 201 (H) 65 - 99 mg/dL   Comment 1 Notify RN    Comment 2 Document in Chart   Glucose, capillary     Status: Abnormal   Collection Time: 06/07/15  6:39 AM  Result Value Ref Range   Glucose-Capillary 165 (H) 65 - 99 mg/dL   Comment 1 Notify RN    Comment 2 Document in Chart   Glucose, capillary     Status: Abnormal   Collection Time: 06/07/15  7:50 AM  Result Value Ref Range   Glucose-Capillary 138 (H) 65 - 99 mg/dL     Studies/Results: No results found.  Marland Kitchen allopurinol  300 mg Oral Daily  . antiseptic oral rinse  7 mL Mouth Rinse BID  . budesonide  0.25 mg Nebulization BID  . carvedilol  3.125 mg Oral BID WC  . colchicine  0.6 mg Oral Daily  . enoxaparin (LOVENOX) injection  40 mg Subcutaneous Q24H  . FLUoxetine  20 mg Oral Daily  . folic acid  1 mg Oral Daily  . furosemide  80 mg Oral BID  . gabapentin  300 mg Oral BID  . insulin aspart  0-20 Units Subcutaneous TID WC  . insulin aspart  0-5 Units Subcutaneous QHS  . levothyroxine  187 mcg Oral QAC breakfast  . magnesium oxide  200 mg Oral BID  . pantoprazole  40 mg Oral QHS  . tamoxifen  20 mg Oral Daily  . venlafaxine XR  37.5 mg Oral Daily     Assessment/Plan: s/p Procedure(s): EXPLORATORY LAPAROTOMY SMALL BOWEL RESECTION  POD #5.  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 Stable and satisfactory progress Continue solid diet. Await bowel function  Moderate to severe deconditioning. Approved for CIR by Dr. Tessa Lerner  Multiple comorbidities, including chronic obstructive pulmonary disease, diabetes, history coronary  artery bypass grafting, chronic diastolic heart failure, hypertension, obesity, atrial fibrillation not felt to be Coumadin candidate due to alcohol use.  @PROBHOSP @  LOS: 5 days    Nihal Marzella M 06/07/2015  . .prob

## 2015-06-08 LAB — GLUCOSE, CAPILLARY
GLUCOSE-CAPILLARY: 204 mg/dL — AB (ref 65–99)
Glucose-Capillary: 120 mg/dL — ABNORMAL HIGH (ref 65–99)
Glucose-Capillary: 212 mg/dL — ABNORMAL HIGH (ref 65–99)
Glucose-Capillary: 167 mg/dL — ABNORMAL HIGH (ref 65–99)

## 2015-06-08 NOTE — Progress Notes (Signed)
Occupational Therapy Evaluation Patient Details Name: [name] MRN: [id number] DOB: November 07, 1945 Today's Date: 06/08/2015    History of Present Illness PAtient is a 69 y.o. male admitted with hx of DM/ CAD/ CABG, DM2, HTN, gout, HL, etoh abuse, afib. 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 In Oct '15 had gait dysfunction and cerebellar CVA. Hx etoh abuse. In Feb '16 had R mastectomy for male breast cancer. Presented with generalized weakness, s/p exp lap for SB resection 06/02/15.   Clinical Impression   Pt admitted with the above diagnoses and presents with below problem list. Pt will benefit from continued acute OT to address the below listed deficits and maximize independence with BADLs prior to d/c to venue below. Pt is currently mod A +2 for LB ADLs and transfers, min A +2 safety/equipment for functional mobility. OT to continue to follow acutely. Recommend CIR at d/c for further rehab prior to returning home with family.     Follow Up Recommendations  CIR    Equipment Recommendations  Other (comment) (TBD next venue)    Recommendations for Other Services Rehab consult     Precautions / Restrictions Precautions Precautions: Fall Required Braces or Orthoses: Other Brace/Splint Other Brace/Splint: abdominal binder Restrictions Weight Bearing Restrictions: No      Mobility Bed Mobility               General bed mobility comments: in recliner  Transfers Overall transfer level: Needs assistance Equipment used: Rolling walker (2 wheeled) Transfers: Sit to/from [facility] Sit to Stand: Mod assist;+2 physical assistance Stand pivot transfers: Mod assist;+2 physical assistance       General transfer comment: from recliner to [facility]. Mod A to powerup and initial balance in standing with pt leaning posteriorly. Pt asking therpist to "lean me forward, I'm going back." With verbal and tactile cueing pt able to  correct upright posture. Pt with initial difficulty coordinating movement pattern of pivoting (vs BLE weakness?).     Balance Overall balance assessment: Needs assistance Sitting-balance support: Feet supported Sitting balance-Leahy Scale: Fair     Standing balance support: Bilateral upper extremity supported;During functional activity Standing balance-Leahy Scale: Poor Standing balance comment: rw and initial assist in standing position                            ADL Overall ADL's : Needs assistance/impaired Eating/Feeding: Set up;Sitting   Grooming: Set up;Sitting   Upper Body Bathing: Minimal assitance;Sitting   Lower Body Bathing: Moderate assistance;+2 for physical assistance;Sit to/from stand   Upper Body Dressing : Set up;Sitting   Lower Body Dressing: Moderate assistance;+2 for physical assistance;Sit to/from stand   Toilet Transfer: Moderate assistance;+2 for physical assistance;Stand-pivot;BSC;RW Toilet Transfer Details (indicate cue type and reason): Due to urgency completed as SPT. Pt then completed household distance ambulation with min A, +2 for chair follow.  Toileting- Water quality scientist and Hygiene: Sit to/from stand;Moderate assistance;+2 for physical assistance   Tub/ Shower Transfer: Walk-in shower;Moderate assistance;+2 for physical assistance Tub/Shower Transfer Details (indicate cue type and reason): mod +2 physical A for sit<>stand, min A +2 equipment for chair follow for ambulating to bathroom Functional mobility during ADLs: Minimal assistance;+2 for safety/equipment;Rolling walker General ADL Comments: Pt requesting to use to toilet citing urgency at start of session. Completed as SPT mod A +2 physical A. After rest break on BSC pt ambulated household distance with min A +2 for  chair follow. O2 assessed at end of walking at 81 with pt seated in recliner. Encouraged breathing techniques with pt O2 recovering to 91-95 after about 2 minutes  seated in recliner. Pt reports he lives with his daughter who is present 24/7. No family present during evaluation.     Vision     Perception     Praxis      Pertinent Vitals/Pain Pain Assessment: No/denies pain     Hand Dominance Right   Extremity/Trunk Assessment Upper Extremity Assessment Upper Extremity Assessment: Generalized weakness   Lower Extremity Assessment Lower Extremity Assessment: Defer to PT evaluation       Communication Communication Communication: No difficulties   Cognition Arousal/Alertness: Awake/alert Behavior During Therapy: WFL for tasks assessed/performed Overall Cognitive Status: No family/caregiver present to determine baseline cognitive functioning Area of Impairment: Safety/judgement;Memory;Problem solving;Following commands     Memory: Decreased short-term memory Following Commands: Follows multi-step commands inconsistently;Follows one step commands consistently Safety/Judgement: Decreased awareness of safety;Decreased awareness of deficits   Problem Solving: Slow processing;Requires verbal cues;Requires tactile cues     General Comments       Exercises       Shoulder Instructions      Home Living Family/patient expects to be discharged to:: Private residence Living Arrangements: Children;Other relatives Available Help at Discharge: Family Type of Home: House Home Access: Stairs to enter [employer] of Steps: 3 Entrance Stairs-Rails: Right;Left Home Layout: One level     Bathroom Shower/Tub: Walk-in shower;Door   [employer] Toilet: Handicapped height Bathroom Accessibility: Yes How Accessible: Other (comment) Home Equipment: Walker - 2 wheels   Additional Comments: Pt with some decreased cognition, no family present to confirm home setup/PLOF.      Prior Functioning/Environment Level of Independence: Independent with assistive device(s)        Comments: using rolling walker, reports he completed shower  transfers at baseline. Unclear what assistance with ADLs he may have needed PTA    OT Diagnosis: Generalized weakness;Cognitive deficits;Other (comment) (decreased standing balance)   OT Problem List: Decreased strength;Decreased activity tolerance;Impaired balance (sitting and/or standing);Decreased coordination;Decreased cognition;Decreased safety awareness;Decreased knowledge of use of DME or AE;Decreased knowledge of precautions;Obesity   OT Treatment/Interventions: Self-care/ADL training;Therapeutic exercise;Neuromuscular education;Energy conservation;DME and/or AE instruction;Therapeutic activities;Cognitive remediation/compensation;Patient/family education;Balance training    OT Goals(Current goals can be found in the care plan section) Acute Rehab OT Goals Patient Stated Goal: return home OT Goal Formulation: With patient Time For Goal Achievement: 06/15/15 Potential to Achieve Goals: Good ADL Goals Pt Will Perform Grooming: with modified independence;standing;sitting Pt Will Perform Upper Body Bathing: with modified independence;with adaptive equipment;sitting Pt Will Perform Lower Body Bathing: with mod assist;with adaptive equipment;sit to/from stand Pt Will Perform Lower Body Dressing: with mod assist;with adaptive equipment;sit to/from stand Pt Will Transfer to Toilet: with min guard assist;ambulating;bedside commode (+2 for safety/equipment) Pt Will Perform Toileting - Clothing Manipulation and hygiene: with mod assist;sit to/from stand;with adaptive equipment Pt Will Perform Tub/Shower Transfer: with +2 total assist;with min assist;3 in 1;rolling walker Pt/caregiver will Perform Home Exercise Program: Increased strength;Both right and left upper extremity;With written HEP provided;With theraband  OT Frequency: Min 3X/week   Barriers to D/C:            Co-evaluation              End of Session Equipment Utilized During Treatment: Gait belt;Rolling  walker  Activity Tolerance: Patient tolerated treatment well;Other (comment) (see comments regarding decreased O2 stats after ambulation) Patient left: in chair;with call bell/phone  within reach   Time: 1036-1109 OT Time Calculation (min): 33 min Charges:  OT General Charges $OT Visit: 1 Procedure OT Evaluation $Initial OT Evaluation Tier I: 1 Procedure OT Treatments $Self Care/Home Management : 8-22 mins G-Codes:    Hortencia Pilar 2015/07/05, 12:42 PM

## 2015-06-08 NOTE — Progress Notes (Signed)
6 Days Post-Op  Subjective: Alert.  Mildly confused but oriented to person, place, situation, and date. Tolerating diet.  Had a good bowel movement yesterday. Deconditioned CBGs controlled, 122- 151 range.  As noted, seen by inpatient rehabilitation service and they feel that he has an appropriate candidate for see how are due to need for 24-hour nursing.  Ultimate goal is to be discharged home.  Patient aware of CIR plan.  Objective: Vital signs in last 24 hours: Temp:  [98.2 F (36.8 C)-98.7 F (37.1 C)] 98.2 F (36.8 C) (08/28 0520) Pulse Rate:  [75-86] 86 (08/28 0520) Resp:  [20-21] 20 (08/28 0520) BP: (123-133)/(62-67) 133/67 mmHg (08/28 0520) SpO2:  [92 %-95 %] 93 % (08/28 0520) Last BM Date: 06/07/15  Intake/Output from previous day: 08/27 0701 - 08/28 0700 In: 976 [P.O.:880; I.V.:96] Out: 1075 [Urine:1075] Intake/Output this shift: Total I/O In: 476 [P.O.:476] Out: -   EXAM: General appearance: Obese. Cooperative. Mild confusion. No agitation Resp: clear to auscultation bilaterally GI: Soft. Not distended. Obese. Wound clean.   packed open  Lab Results:  Results for orders placed or performed during the hospital encounter of 06/02/15 (from the past 24 hour(s))  Glucose, capillary     Status: Abnormal   Collection Time: 06/07/15 11:56 AM  Result Value Ref Range   Glucose-Capillary 151 (H) 65 - 99 mg/dL  Glucose, capillary     Status: Abnormal   Collection Time: 06/07/15  4:55 PM  Result Value Ref Range   Glucose-Capillary 148 (H) 65 - 99 mg/dL  Glucose, capillary     Status: Abnormal   Collection Time: 06/07/15  9:34 PM  Result Value Ref Range   Glucose-Capillary 148 (H) 65 - 99 mg/dL   Comment 1 Notify RN    Comment 2 Document in Chart   Glucose, capillary     Status: Abnormal   Collection Time: 06/08/15  7:43 AM  Result Value Ref Range   Glucose-Capillary 120 (H) 65 - 99 mg/dL     Studies/Results: No results found.  Marland Kitchen allopurinol  300 mg  Oral Daily  . antiseptic oral rinse  7 mL Mouth Rinse BID  . budesonide  0.25 mg Nebulization BID  . carvedilol  3.125 mg Oral BID WC  . colchicine  0.6 mg Oral Daily  . enoxaparin (LOVENOX) injection  40 mg Subcutaneous Q24H  . FLUoxetine  20 mg Oral Daily  . folic acid  1 mg Oral Daily  . furosemide  80 mg Oral BID  . gabapentin  300 mg Oral BID  . insulin aspart  0-20 Units Subcutaneous TID WC  . insulin aspart  0-5 Units Subcutaneous QHS  . levothyroxine  187 mcg Oral QAC breakfast  . magnesium oxide  200 mg Oral BID  . pantoprazole  40 mg Oral QHS  . tamoxifen  20 mg Oral Daily  . venlafaxine XR  37.5 mg Oral Daily     Assessment/Plan: s/p Procedure(s): EXPLORATORY LAPAROTOMY SMALL BOWEL RESECTION   POD #6. Expl. 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 Stable and satisfactory progress Continue solid diet. Bowel function has returned. Patient is stable for transfer to CIR  tomorrow, if bed available  Moderate to severe deconditioning. Approved for CIR by Dr. Tessa Lerner  Multiple comorbidities, including chronic obstructive pulmonary disease, diabetes, history coronary artery bypass grafting, chronic diastolic heart failure, hypertension, obesity, atrial fibrillation not felt to be Coumadin candidate due to alcohol use.  @PROBHOSP @  LOS: 6 days  Ilka Lovick M 06/08/2015  . .prob

## 2015-06-09 ENCOUNTER — Inpatient Hospital Stay (HOSPITAL_COMMUNITY)
Admission: RE | Admit: 2015-06-09 | Discharge: 2015-06-18 | DRG: 947 | Disposition: A | Discharge status: Home Health | Payer: Medicare Other | Source: Intra-hospital | Attending: Physical Medicine & Rehabilitation | Admitting: Physical Medicine & Rehabilitation

## 2015-06-09 ENCOUNTER — Encounter (HOSPITAL_COMMUNITY): Payer: Self-pay | Admitting: *Deleted

## 2015-06-09 DIAGNOSIS — K219 Gastro-esophageal reflux disease without esophagitis: Secondary | ICD-10-CM | POA: Diagnosis present

## 2015-06-09 DIAGNOSIS — R531 Weakness: Secondary | ICD-10-CM | POA: Diagnosis present

## 2015-06-09 DIAGNOSIS — I5033 Acute on chronic diastolic (congestive) heart failure: Secondary | ICD-10-CM | POA: Insufficient documentation

## 2015-06-09 DIAGNOSIS — G4733 Obstructive sleep apnea (adult) (pediatric): Secondary | ICD-10-CM | POA: Diagnosis present

## 2015-06-09 DIAGNOSIS — E1142 Type 2 diabetes mellitus with diabetic polyneuropathy: Secondary | ICD-10-CM | POA: Diagnosis present

## 2015-06-09 DIAGNOSIS — R0602 Shortness of breath: Secondary | ICD-10-CM | POA: Insufficient documentation

## 2015-06-09 DIAGNOSIS — J438 Other emphysema: Secondary | ICD-10-CM | POA: Diagnosis not present

## 2015-06-09 DIAGNOSIS — G8929 Other chronic pain: Secondary | ICD-10-CM | POA: Diagnosis present

## 2015-06-09 DIAGNOSIS — M1990 Unspecified osteoarthritis, unspecified site: Secondary | ICD-10-CM | POA: Diagnosis present

## 2015-06-09 DIAGNOSIS — J449 Chronic obstructive pulmonary disease, unspecified: Secondary | ICD-10-CM | POA: Diagnosis present

## 2015-06-09 DIAGNOSIS — I481 Persistent atrial fibrillation: Secondary | ICD-10-CM | POA: Diagnosis present

## 2015-06-09 DIAGNOSIS — Z87891 Personal history of nicotine dependence: Secondary | ICD-10-CM

## 2015-06-09 DIAGNOSIS — D62 Acute posthemorrhagic anemia: Secondary | ICD-10-CM | POA: Diagnosis present

## 2015-06-09 DIAGNOSIS — E785 Hyperlipidemia, unspecified: Secondary | ICD-10-CM | POA: Diagnosis present

## 2015-06-09 DIAGNOSIS — R5381 Other malaise: Secondary | ICD-10-CM | POA: Diagnosis not present

## 2015-06-09 DIAGNOSIS — K5900 Constipation, unspecified: Secondary | ICD-10-CM | POA: Diagnosis present

## 2015-06-09 DIAGNOSIS — I10 Essential (primary) hypertension: Secondary | ICD-10-CM | POA: Diagnosis present

## 2015-06-09 DIAGNOSIS — I2510 Atherosclerotic heart disease of native coronary artery without angina pectoris: Secondary | ICD-10-CM | POA: Diagnosis present

## 2015-06-09 DIAGNOSIS — Z951 Presence of aortocoronary bypass graft: Secondary | ICD-10-CM

## 2015-06-09 DIAGNOSIS — Z9981 Dependence on supplemental oxygen: Secondary | ICD-10-CM

## 2015-06-09 DIAGNOSIS — I480 Paroxysmal atrial fibrillation: Secondary | ICD-10-CM | POA: Diagnosis present

## 2015-06-09 DIAGNOSIS — M109 Gout, unspecified: Secondary | ICD-10-CM | POA: Diagnosis present

## 2015-06-09 DIAGNOSIS — G9341 Metabolic encephalopathy: Secondary | ICD-10-CM | POA: Diagnosis present

## 2015-06-09 DIAGNOSIS — E039 Hypothyroidism, unspecified: Secondary | ICD-10-CM | POA: Diagnosis present

## 2015-06-09 DIAGNOSIS — J45909 Unspecified asthma, uncomplicated: Secondary | ICD-10-CM | POA: Diagnosis present

## 2015-06-09 DIAGNOSIS — I5032 Chronic diastolic (congestive) heart failure: Secondary | ICD-10-CM | POA: Diagnosis not present

## 2015-06-09 DIAGNOSIS — I509 Heart failure, unspecified: Secondary | ICD-10-CM | POA: Diagnosis not present

## 2015-06-09 LAB — GLUCOSE, CAPILLARY
GLUCOSE-CAPILLARY: 221 mg/dL — AB (ref 65–99)
GLUCOSE-CAPILLARY: 225 mg/dL — AB (ref 65–99)
Glucose-Capillary: 128 mg/dL — ABNORMAL HIGH (ref 65–99)
Glucose-Capillary: 184 mg/dL — ABNORMAL HIGH (ref 65–99)

## 2015-06-09 MED ORDER — ALBUTEROL SULFATE (2.5 MG/3ML) 0.083% IN NEBU
2.5000 mg | INHALATION_SOLUTION | Freq: Four times a day (QID) | RESPIRATORY_TRACT | Status: DC
  Administered 2015-06-09: 2.5 mg via RESPIRATORY_TRACT
  Filled 2015-06-09: qty 3

## 2015-06-09 MED ORDER — DIPHENHYDRAMINE HCL 50 MG/ML IJ SOLN: 12.5000 mg | Freq: Four times a day (QID) | INTRAMUSCULAR | Status: DC | PRN

## 2015-06-09 MED ORDER — BUDESONIDE 0.25 MG/2ML IN SUSP
0.2500 mg | Freq: Two times a day (BID) | RESPIRATORY_TRACT | Status: DC
  Administered 2015-06-09 – 2015-06-18 (×17): 0.25 mg via RESPIRATORY_TRACT
  Filled 2015-06-09 (×18): qty 2

## 2015-06-09 MED ORDER — INSULIN ASPART 100 UNIT/ML ~~LOC~~ SOLN
0.0000 [IU] | Freq: Every day | SUBCUTANEOUS | Status: DC
Start: 2015-06-09 — End: 2015-06-18
  Administered 2015-06-09 – 2015-06-10 (×2): 2 [IU] via SUBCUTANEOUS

## 2015-06-09 MED ORDER — CARVEDILOL 3.125 MG PO TABS
3.1250 mg | ORAL_TABLET | Freq: Two times a day (BID) | ORAL | Status: DC
  Administered 2015-06-10 – 2015-06-18 (×17): 3.125 mg via ORAL
  Filled 2015-06-09 (×17): qty 1

## 2015-06-09 MED ORDER — FLUOXETINE HCL 20 MG PO CAPS
20.0000 mg | ORAL_CAPSULE | Freq: Every day | ORAL | Status: DC
  Administered 2015-06-10 – 2015-06-18 (×9): 20 mg via ORAL
  Filled 2015-06-09 (×9): qty 1

## 2015-06-09 MED ORDER — ONDANSETRON HCL 4 MG/2ML IJ SOLN
4.0000 mg | Freq: Four times a day (QID) | INTRAMUSCULAR | Status: DC | PRN
  Administered 2015-06-10: 4 mg via INTRAVENOUS
  Filled 2015-06-09: qty 2

## 2015-06-09 MED ORDER — SIMETHICONE 80 MG PO CHEW
80.0000 mg | CHEWABLE_TABLET | Freq: Four times a day (QID) | ORAL | Status: DC | PRN
  Administered 2015-06-13 – 2015-06-15 (×2): 80 mg via ORAL
  Filled 2015-06-09 (×3): qty 1

## 2015-06-09 MED ORDER — ALBUTEROL SULFATE HFA 108 (90 BASE) MCG/ACT IN AERS: 2.0000 | INHALATION_SPRAY | Freq: Four times a day (QID) | RESPIRATORY_TRACT | Status: DC

## 2015-06-09 MED ORDER — VENLAFAXINE HCL ER 37.5 MG PO CP24
37.5000 mg | ORAL_CAPSULE | Freq: Every day | ORAL | Status: DC
  Administered 2015-06-10 – 2015-06-18 (×9): 37.5 mg via ORAL
  Filled 2015-06-09 (×12): qty 1

## 2015-06-09 MED ORDER — ALLOPURINOL 300 MG PO TABS
300.0000 mg | ORAL_TABLET | Freq: Every day | ORAL | Status: DC
  Administered 2015-06-10 – 2015-06-18 (×9): 300 mg via ORAL
  Filled 2015-06-09 (×9): qty 1

## 2015-06-09 MED ORDER — OXYCODONE HCL 5 MG PO TABS
5.0000 mg | ORAL_TABLET | ORAL | Status: DC | PRN
  Administered 2015-06-09 – 2015-06-18 (×29): 5 mg via ORAL
  Filled 2015-06-09 (×30): qty 1

## 2015-06-09 MED ORDER — DIPHENHYDRAMINE HCL 12.5 MG/5ML PO ELIX
12.5000 mg | ORAL_SOLUTION | Freq: Four times a day (QID) | ORAL | Status: DC | PRN
  Administered 2015-06-10 – 2015-06-17 (×5): 12.5 mg via ORAL
  Filled 2015-06-09 (×5): qty 10

## 2015-06-09 MED ORDER — GUAIFENESIN-DM 100-10 MG/5ML PO SYRP: 5.0000 mL | ORAL_SOLUTION | Freq: Four times a day (QID) | ORAL | Status: DC | PRN

## 2015-06-09 MED ORDER — GABAPENTIN 300 MG PO CAPS
300.0000 mg | ORAL_CAPSULE | Freq: Two times a day (BID) | ORAL | Status: DC
  Administered 2015-06-09 – 2015-06-18 (×18): 300 mg via ORAL
  Filled 2015-06-09 (×18): qty 1

## 2015-06-09 MED ORDER — ENOXAPARIN SODIUM 40 MG/0.4ML ~~LOC~~ SOLN
40.0000 mg | SUBCUTANEOUS | Status: DC
  Administered 2015-06-10 – 2015-06-18 (×9): 40 mg via SUBCUTANEOUS
  Filled 2015-06-09 (×9): qty 0.4

## 2015-06-09 MED ORDER — INSULIN ASPART 100 UNIT/ML ~~LOC~~ SOLN
0.0000 [IU] | Freq: Three times a day (TID) | SUBCUTANEOUS | Status: DC
Start: 2015-06-10 — End: 2015-06-18
  Administered 2015-06-10 (×3): 4 [IU] via SUBCUTANEOUS
  Administered 2015-06-11 (×3): 3 [IU] via SUBCUTANEOUS
  Administered 2015-06-12: 4 [IU] via SUBCUTANEOUS
  Administered 2015-06-12 – 2015-06-13 (×3): 3 [IU] via SUBCUTANEOUS
  Administered 2015-06-14: 4 [IU] via SUBCUTANEOUS
  Administered 2015-06-14: 3 [IU] via SUBCUTANEOUS
  Administered 2015-06-16: 4 [IU] via SUBCUTANEOUS
  Administered 2015-06-16 – 2015-06-18 (×4): 3 [IU] via SUBCUTANEOUS

## 2015-06-09 MED ORDER — FUROSEMIDE 40 MG PO TABS
80.0000 mg | ORAL_TABLET | Freq: Two times a day (BID) | ORAL | Status: DC
  Administered 2015-06-09 – 2015-06-12 (×6): 80 mg via ORAL
  Filled 2015-06-09 (×6): qty 2

## 2015-06-09 MED ORDER — BISACODYL 10 MG RE SUPP: 10.0000 mg | Freq: Every day | RECTAL | Status: DC | PRN

## 2015-06-09 MED ORDER — PANTOPRAZOLE SODIUM 40 MG PO TBEC
40.0000 mg | DELAYED_RELEASE_TABLET | Freq: Every day | ORAL | Status: DC
  Administered 2015-06-09 – 2015-06-17 (×9): 40 mg via ORAL
  Filled 2015-06-09 (×9): qty 1

## 2015-06-09 MED ORDER — LEVOTHYROXINE SODIUM 137 MCG PO TABS
187.0000 ug | ORAL_TABLET | Freq: Every day | ORAL | Status: DC
  Administered 2015-06-10 – 2015-06-18 (×9): 187 ug via ORAL
  Filled 2015-06-09 (×18): qty 1

## 2015-06-09 MED ORDER — ONDANSETRON 4 MG PO TBDP
4.0000 mg | ORAL_TABLET | Freq: Four times a day (QID) | ORAL | Status: DC | PRN
  Filled 2015-06-09: qty 1

## 2015-06-09 MED ORDER — TAMOXIFEN CITRATE 10 MG PO TABS
20.0000 mg | ORAL_TABLET | Freq: Every day | ORAL | Status: DC
  Administered 2015-06-10 – 2015-06-18 (×9): 20 mg via ORAL
  Filled 2015-06-09 (×12): qty 2

## 2015-06-09 MED ORDER — MAGNESIUM OXIDE 400 (241.3 MG) MG PO TABS
200.0000 mg | ORAL_TABLET | Freq: Two times a day (BID) | ORAL | Status: DC
  Administered 2015-06-09 – 2015-06-18 (×18): 200 mg via ORAL
  Filled 2015-06-09 (×17): qty 1

## 2015-06-09 MED ORDER — SENNOSIDES-DOCUSATE SODIUM 8.6-50 MG PO TABS
1.0000 | ORAL_TABLET | Freq: Every day | ORAL | Status: DC
  Administered 2015-06-09 – 2015-06-17 (×9): 1 via ORAL
  Filled 2015-06-09 (×9): qty 1

## 2015-06-09 MED ORDER — FLEET ENEMA 7-19 GM/118ML RE ENEM: 1.0000 | ENEMA | Freq: Once | RECTAL | Status: DC | PRN

## 2015-06-09 MED ORDER — ACETAMINOPHEN 325 MG PO TABS
325.0000 mg | ORAL_TABLET | ORAL | Status: DC | PRN
  Administered 2015-06-12 – 2015-06-13 (×3): 650 mg via ORAL
  Filled 2015-06-09 (×3): qty 2

## 2015-06-09 MED ORDER — COLCHICINE 0.6 MG PO TABS
0.6000 mg | ORAL_TABLET | Freq: Every day | ORAL | Status: DC
  Administered 2015-06-10 – 2015-06-18 (×9): 0.6 mg via ORAL
  Filled 2015-06-09 (×9): qty 1

## 2015-06-09 MED ORDER — FOLIC ACID 1 MG PO TABS
1.0000 mg | ORAL_TABLET | Freq: Every day | ORAL | Status: DC
Start: 2015-06-10 — End: 2015-06-18
  Administered 2015-06-10 – 2015-06-18 (×9): 1 mg via ORAL
  Filled 2015-06-09 (×9): qty 1

## 2015-06-09 NOTE — PMR Pre-admission (Signed)
PMR Admission Coordinator Pre-Admission Assessment  Patient: [name] is an 69 y.o., male MRN: [id number] DOB: [birth year] Height: 5\' 8"  (172.7 cm) Weight: 120.3 kg (265 lb 3.4 oz)              Insurance Information HMO: No    PPO:       PCP:       IPA:       80/20:       OTHER:   PRIMARY:  Medicare A/B      Policy#:  [id number]      Subscriber: [name] CM Name:        Phone#:       Fax#:   Pre-Cert#:        Employer:   Benefits:  Phone #:       Name: Checked in [place]. Date: 11/11/08     Deduct: $1288      Out of Pocket Max: None      Life Max: unlimited CIR: 100%      SNF: 100 days Outpatient: 80%     Co-Pay: 20% Home Health: 100%      Co-Pay: none DME: 80%     Co-Pay: 20% Providers: patient's choice  SECONDARY:  [employer]      Policy#: [id number]      Subscriber: [name] CM Name:        Phone#:       Fax#:   Pre-Cert#:        Employer: Retired Benefits:  Phone #: [phone]     Name:   Eff. Date:       Deduct:        Out of Pocket Max:        Life Max:   CIR:        SNF:   Outpatient:       Co-Pay:   Home Health:        Co-Pay:   DME:       Co-Pay:    Emergency Contact Information Contact Information    Name Relation Home Work Mobile   Hardy,[name] Daughter [phone]  [phone]     Current Medical History  Patient Admitting Diagnosis: Debility and encephalopathy after small bowel resection    History of Present Illness: A 69 y.o. 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  He was admitted on 06/02/15 for Exp Lap with small bowel resection by Dr. Ninfa Linden. He has been advanced to soft diet and is having BM. He continues to have confusion post op.Wound healing and plans for tansition to [facility] in the near future. Therapy ongoing and CIR recommended due to deconditioned state.    Past Medical History  Past Medical History  Diagnosis Date  . CHRONIC OBSTRUCTIVE PULMONARY DISEASE 06/20/2009  . CAROTID STENOSIS 06/20/2009    A. 08/2001 s/p L CEA;  B.   09/14/11 - Carotid U/S - 40-59% bilateral stenosis, left CEA patch angioplasty is patent  . DM 06/20/2009  . CAD 06/20/2009    A.  08/2000 - s/p CABG x 4 - LIMA-LAD, Left Radial-OM, VG-DIAG, VG-RCA;  B. Neg. MV  2010  . HYPERLIPIDEMIA 06/20/2009  . HYPERTENSION 06/20/2009  . Hypothyroidism   . Low back pain   . Asthma     as child  . Pneumonia   . Persistent atrial  fibrillation     Not felt to be coumadin candidate 2/2 ETOH use.  Marland Kitchen History of tobacco abuse     remote - quit 1970  . Morbidly obese   . Chronic diastolic heart failure, NYHA class 3     Followed by [facility] --> Echo 04/2014: EF 70-62%, Diastolic DysFxn - elevated LVEDP & LAP. Mod Dil LA & RA.  . Falls frequently   . Hx of cardiovascular stress test 05/2014    Lexiscan Myoview (8/15):  No ischemia; EF 63% - Normal Study  . Breast cancer, right breast   . Gout   . OBSTRUCTIVE SLEEP APNEA 06/20/2009    does not use cpap  . Stroke     found on MRI in Oct. 2015.   Marland Kitchen Shortness of breath dyspnea     occasional  . Depression     takes Prozac  . GERD (gastroesophageal reflux disease)     takes Prilosec  . Arthritis   . Neuromuscular disorder     neuropathy in both legs  . Constipation   . ETOH abuse     no alcohol since Oct. 2015  . 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 CHF (congestive heart failure)   . Supplemental oxygen dependent     hs  . Bilateral renal cysts   . Kidney disease     Stage 3    Family History  family history includes Cancer in his mother; Cancer (age of onset: 23) in his brother; Stroke in his father and mother. There is no history of Heart attack.  Prior Rehab/Hospitalizations: Had [facility] PT/RN 2 months ago from [place].  Has the patient had major surgery during 100  days prior to admission? No.  However, had right side breast removed 4-6 months ago for cancer.  Current Medications   Current facility-administered medications:  .  0.9 %  sodium chloride infusion, , Intravenous, Continuous, Suzette Battiest, MD, Last Rate: 10 mL/hr at 06/02/15 0730 .  0.9 % NaCl with KCl 20 mEq/ L  infusion, , Intravenous, Continuous, Coralie Keens, MD, Last Rate: 10 mL/hr at 06/06/15 0834 .  allopurinol (ZYLOPRIM) tablet 300 mg, 300 mg, Oral, Daily, Coralie Keens, MD, 300 mg at 06/09/15 0818 .  antiseptic oral rinse (CPC / CETYLPYRIDINIUM CHLORIDE 0.05%) solution 7 mL, 7 mL, Mouth Rinse, BID, Coralie Keens, MD, 7 mL at 06/08/15 2244 .  budesonide (PULMICORT) nebulizer solution 0.25 mg, 0.25 mg, Nebulization, BID, Coralie Keens, MD, 0.25 mg at 06/09/15 0733 .  carvedilol (COREG) tablet 3.125 mg, 3.125 mg, Oral, BID WC, Coralie Keens, MD, 3.125 mg at 06/09/15 0819 .  colchicine tablet 0.6 mg, 0.6 mg, Oral, Daily, Coralie Keens, MD, 0.6 mg at 06/09/15 0819 .  diphenhydrAMINE (BENADRYL) 12.5 MG/5ML elixir 12.5 mg, 12.5 mg, Oral, Q6H PRN **OR** diphenhydrAMINE (BENADRYL) injection 12.5 mg, 12.5 mg, Intravenous, Q6H PRN, Coralie Keens, MD .  enoxaparin (LOVENOX) injection 40 mg, 40 mg, Subcutaneous, Q24H, Coralie Keens, MD, 40 mg at 06/09/15 0820 .  FLUoxetine (PROZAC) capsule 20 mg, 20 mg, Oral, Daily, Coralie Keens, MD, 20 mg at 06/09/15 0819 .  folic acid (FOLVITE) tablet 1 mg, 1 mg, Oral, Daily, Coralie Keens, MD, 1 mg at 06/09/15 0819 .  furosemide (LASIX) tablet 80 mg, 80 mg, Oral, BID, Coralie Keens, MD, 80 mg at 06/09/15 0819 .  gabapentin (NEURONTIN) capsule 300 mg, 300 mg, Oral, BID, Coralie Keens, MD, 300 mg at  06/09/15 0819 .  hydrALAZINE (APRESOLINE) injection 20 mg, 20 mg, Intravenous, Q4H PRN, Rolm Bookbinder, MD, 20 mg at 06/03/15 1803 .  insulin aspart (novoLOG) injection 0-20 Units, 0-20 Units, Subcutaneous, TID WC, Coralie Keens, MD, 4 Units at 06/09/15 1329 .  insulin aspart (novoLOG) injection 0-5 Units, 0-5 Units, Subcutaneous, QHS, Coralie Keens, MD, 2 Units at 06/06/15 2250 .  levothyroxine (SYNTHROID, LEVOTHROID) tablet 187 mcg, 187 mcg, Oral, QAC breakfast, Coralie Keens, MD, 187 mcg at 06/09/15 0817 .  magnesium oxide (MAG-OX) tablet 200 mg, 200 mg, Oral, BID, Coralie Keens, MD, 200 mg at 06/09/15 0818 .  morphine 2 MG/ML injection 1-4 mg, 1-4 mg, Intravenous, Q1H PRN, Coralie Keens, MD, 2 mg at 06/08/15 0900 .  ondansetron (ZOFRAN-ODT) disintegrating tablet 4 mg, 4 mg, Oral, Q6H PRN, 4 mg at 06/02/15 2112 **OR** ondansetron (ZOFRAN) injection 4 mg, 4 mg, Intravenous, Q6H PRN, Coralie Keens, MD .  oxyCODONE (Oxy IR/ROXICODONE) immediate release tablet 5-10 mg, 5-10 mg, Oral, Q4H PRN, Coralie Keens, MD, 10 mg at 06/09/15 0818 .  pantoprazole (PROTONIX) EC tablet 40 mg, 40 mg, Oral, QHS, Cecilio Asper Batchelder, RPH, 40 mg at 06/08/15 2244 .  simethicone (MYLICON) chewable tablet 80 mg, 80 mg, Oral, Q6H PRN, Stark Klein, MD, 80 mg at 06/06/15 2011 .  tamoxifen (NOLVADEX) tablet 20 mg, 20 mg, Oral, Daily, Coralie Keens, MD, 20 mg at 06/09/15 1114 .  venlafaxine XR (EFFEXOR-XR) 24 hr capsule 37.5 mg, 37.5 mg, Oral, Daily, Coralie Keens, MD, 37.5 mg at 06/09/15 1114  Patients Current Diet: DIET SOFT Room service appropriate?: Yes; Fluid consistency:: Thin  Precautions / Restrictions Precautions Precautions: Fall Other Brace/Splint: abdominal binder Restrictions Weight Bearing Restrictions: No   Has the patient had 2 or more falls or a fall with injury in the past year?No.  However, had 1 fall 3 weeks ago with no injury.  Prior Activity Level Limited Community (1-2x/wk): Went out 1-2 X a week to MD appointments  Home Assistive Devices / [place] Devices/Equipment: Environmental consultant (specify type), CBG Meter Home Equipment: Walker - 2 wheels  Prior Device Use: Indicate  devices/aids used by the patient prior to current illness, exacerbation or injury? Manual wheelchair and Walker  Prior Functional Level Prior Function Level of Independence: Independent with assistive device(s) Comments: using rolling walker  Self Care: Did the patient need help bathing, dressing, using the toilet or eating?  Needed some help  Indoor Mobility: Did the patient need assistance with walking from room to room (with or without device)? Needed some help  Stairs: Did the patient need assistance with internal or external stairs (with or without device)? Needed some help  Functional Cognition: Did the patient need help planning regular tasks such as shopping or remembering to take medications? Needed some help  Current Functional Level Cognition  Overall Cognitive Status: No family/caregiver present to determine baseline cognitive functioning Orientation Level: Oriented to person, Disoriented to time, Disoriented to situation, Disoriented to place Following Commands: Follows multi-step commands inconsistently, Follows one step commands consistently Safety/Judgement: Decreased awareness of safety, Decreased awareness of deficits General Comments: awoke from sleep and was more confused thinking he was driving a dump truck, better after re-orientation, but then kept repeating questions about location and time throughout session    Extremity Assessment (includes Sensation/Coordination)  Upper Extremity Assessment: Generalized weakness  Lower Extremity Assessment: Defer to PT evaluation    ADLs  Overall ADL's : Needs assistance/impaired Eating/Feeding: Set up, Sitting Grooming: Set up, Sitting Upper  Body Bathing: Minimal assitance, Sitting Lower Body Bathing: Moderate assistance, +2 for physical assistance, Sit to/from stand Upper Body Dressing : Set up, Sitting Lower Body Dressing: Moderate assistance, +2 for physical assistance, Sit to/from stand Toilet Transfer: Moderate  assistance, +2 for physical assistance, Stand-pivot, BSC, RW Toilet Transfer Details (indicate cue type and reason): Due to urgency completed as SPT. Pt then completed household distance ambulation with min A, +2 for chair follow.  Toileting- Clothing Manipulation and Hygiene: Sit to/from stand, Moderate assistance, +2 for physical assistance Tub/ Shower Transfer: Walk-in shower, Moderate assistance, +2 for physical assistance Tub/Shower Transfer Details (indicate cue type and reason): mod +2 physical A for sit<>stand, min A +2 equipment for chair follow for ambulating to bathroom Functional mobility during ADLs: Minimal assistance, +2 for safety/equipment, Rolling walker General ADL Comments: Pt requesting to use to toilet citing urgency at start of session. Completed as SPT mod A +2 physical A. After rest break on [facility] pt ambulated household distance with min A +2 for chair follow. O2 assessed at end of walking at 81 with pt seated in recliner. Encouraged breathing techniques with pt O2 recovering to 91-95 after about 2 minutes seated in recliner. Pt reports he lives with his daughter who is present 24/7. No family present during evaluation.    Mobility  Overal bed mobility: Needs Assistance Bed Mobility: Rolling, Sidelying to Sit Rolling: Min assist Sidelying to sit: Mod assist Supine to sit: Mod assist (assist at trunk) Sit to supine: Mod assist (assist with bilateral LEs) General bed mobility comments: in recliner    Transfers  Overall transfer level: Needs assistance Equipment used: Rolling walker (2 wheeled) Transfers: Sit to/from Stand, Stand Pivot Transfers Sit to Stand: Mod assist, +2 physical assistance Stand pivot transfers: Mod assist, +2 physical assistance General transfer comment: from recliner to [facility]. Mod A to powerup and initial balance in standing with pt leaning posteriorly. Pt asking therpist to "lean me forward, I'm going back." With verbal and tactile cueing pt able to  correct upright posture. Pt with initial difficulty coordinating movement pattern of pivoting (vs BLE weakness?).     Ambulation / Gait / Stairs / Wheelchair Mobility  Ambulation/Gait Ambulation/Gait assistance: Museum/gallery curator (Feet): 10 Feet Assistive device: Rolling walker (2 wheeled) Gait Pattern/deviations: Step-through pattern, Decreased step length - right, Decreased step length - left General Gait Details: occasional assist with rw positioning. SpO2 94% sitting and 88% after ambulation, 92% before leaving room.  Gait velocity: decreased    Posture / Balance Dynamic Sitting Balance Sitting balance - Comments: sitting without UE support about 6 minutes Balance Overall balance assessment: Needs assistance Sitting-balance support: Feet supported Sitting balance-Leahy Scale: Fair Sitting balance - Comments: sitting without UE support about 6 minutes Postural control: Posterior lean Standing balance support: Bilateral upper extremity supported, During functional activity Standing balance-Leahy Scale: Poor Standing balance comment: rw and initial assist in standing position    Special needs/care consideration BiPAP/CPAP He has CPAP but he does not use it. CPM No Continuous Drip IV KVO  Dialysis No       Life Vest No Oxygen Has 02 at night 2L [place] Special Bed No Trach Size No Wound Vac (area) Likely will have VAC applied soon      Skin Redness on abdomen at times if not washed daily. Has abdominal incision and right breast incision with dressings.  Bowel mgmt: Last BM 06/07/15 Bladder mgmt: Urgency Diabetic mgmt: Yes, gets insulin at night.    Previous Home Environment Living Arrangements: Children, Other relatives Available Help at Discharge: Family Type of Home: House Home Layout: One level Home Access: Stairs to enter Entrance Stairs-Rails: Right, Left Entrance Stairs-Number of Steps: 3 Bathroom Shower/Tub: Gaffer,  Door [employer] Toilet: Handicapped height Bathroom Accessibility: Yes How Accessible: Other (comment) [place]: Other (Comment) ("sometimes people come out to the house" ) Additional Comments: Pt with some decreased cognition, no family present to confirm home setup/PLOF.  Discharge Living Setting Plans for Discharge Living Setting: Patient's home, House, Lives with (comment) (Lives with daughter and son-in-law.) Type of Home at Discharge: House Discharge Home Layout: One level Discharge Home Access: Stairs to enter Entrance Stairs-Number of Steps: 5 step entry Does the patient have any problems obtaining your medications?: No  Social/Family/Support Systems Patient Roles: Parent (Has a daughter and a son-in-law.) Contact Information: Darrold Junker - daughter Anticipated Caregiver: daughter Anticipated Caregiver's Contact Information: [name] - dtr [phone] Ability/Limitations of Caregiver: Dtr does not work and can provide supervision at home. Caregiver Availability: 24/7 Discharge Plan Discussed with Primary Caregiver: Yes Is Caregiver In Agreement with Plan?: Yes Does Caregiver/Family have Issues with Lodging/Transportation while Pt is in Rehab?: No  Goals/Additional Needs Patient/Family Goal for Rehab: PT mod I, OT mod I and supervision, ST mod I and supervision goals Expected length of stay: 8-12 days Cultural Considerations: None Dietary Needs: Soft diet, thin liquids Equipment Needs: TBD Pt/Family Agrees to Admission and willing to participate: Yes (I spoke with daughter, [name], and she is agreeable.) Program Orientation Provided & Reviewed with Pt/Caregiver Including Roles  & Responsibilities: Yes  Decrease burden of Care through IP rehab admission: N/A  Possible need for SNF placement upon discharge: Not planned  Patient Condition: This patient's medical and functional status has changed since the consult dated: 06/06/15 in which the Rehabilitation Physician  determined and documented that the patient's condition is appropriate for intensive rehabilitative care in an inpatient rehabilitation facility. See "History of Present Illness" (above) for medical update. Functional changes are:  Currently requiring mod assist for stand pivot transfers. Patient's medical and functional status update has been discussed with the Rehabilitation physician and patient remains appropriate for inpatient rehabilitation. Will admit to inpatient rehab today.  Preadmission Screen Completed By:  Retta Diones, 06/09/2015 2:37 PM ______________________________________________________________________   Discussed status with Dr. Naaman Plummer on 06/09/15 at 1437 and received telephone approval for admission today.  Admission Coordinator:  Retta Diones, time1437/Date08/29/16

## 2015-06-09 NOTE — Progress Notes (Signed)
RN was unable to scan pt before dinner and had to override blood sugar. CBG was 221 prior to dinner.

## 2015-06-09 NOTE — Progress Notes (Signed)
[name] Diones, RN Rehab Admission Coordinator Signed Physical Medicine and Rehabilitation PMR Pre-admission 06/09/2015 1:40 PM  Related encounter: Admission (Current) from 06/02/2015 in [facility]   PMR Admission Coordinator Pre-Admission Assessment  Patient: [name] is an 69 y.o., male MRN: [id number] DOB: [birth year] Height: 5\' 8"  (172.7 cm) Weight: 120.3 kg (265 lb 3.4 oz)  Insurance Information HMO: No PPO: PCP: IPA: 80/20: OTHER:  PRIMARY: Medicare A/B Policy#: [id number] Subscriber: [name] CM Name: Phone#: Fax#:  Pre-Cert#: Employer:  Benefits: Phone #: Name: Checked in [place]. Date: 11/11/08 Deduct: $1288 Out of Pocket Max: None Life Max: unlimited CIR: 100% SNF: 100 days Outpatient: 80% Co-Pay: 20% Home Health: 100% Co-Pay: none DME: 80% Co-Pay: 20% Providers: patient's choice  SECONDARY: [employer] Policy#: [id number] Subscriber: [name] CM Name: Phone#: Fax#:  Pre-Cert#: Employer: Retired Benefits: Phone #: [phone] Name:  Eff. Date: Deduct: Out of Pocket Max: Life Max:  CIR: SNF:  Outpatient: Co-Pay:  Home Health: Co-Pay:  DME: Co-Pay:   Emergency Contact Information Contact Information    Name Relation Home Work Mobile   [name],[name] Daughter [phone]  [phone]     Current Medical History  Patient Admitting Diagnosis: Debility and encephalopathy after small bowel resection   History of Present Illness: A 69 y.o. 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 He was admitted on 06/02/15 for Exp Lap with small bowel resection by Dr. Ninfa Linden. He has been advanced to soft diet and is having BM. He continues to have confusion post op.Wound healing and plans for tansition to [facility] in the near future. Therapy ongoing and CIR recommended due to deconditioned state.   Past Medical History  Past Medical History  Diagnosis Date  . CHRONIC OBSTRUCTIVE PULMONARY DISEASE 06/20/2009  . CAROTID STENOSIS 06/20/2009    A. 08/2001 s/p L CEA; B. 09/14/11 - Carotid U/S - 40-59% bilateral stenosis, left CEA patch angioplasty is patent  . DM 06/20/2009  . CAD 06/20/2009    A. 08/2000 - s/p CABG x 4 - LIMA-LAD, Left Radial-OM, VG-DIAG, VG-RCA; B. Neg. MV 2010  . HYPERLIPIDEMIA 06/20/2009  . HYPERTENSION 06/20/2009  . Hypothyroidism   . Low back pain   . Asthma     as child  . Pneumonia   . Persistent atrial fibrillation     Not felt to be coumadin candidate 2/2 ETOH use.  Marland Kitchen History of tobacco abuse     remote - quit 1970  . Morbidly obese   . Chronic diastolic heart failure, NYHA class 3     Followed by [facility] --> Echo 04/2014: EF 06-23%, Diastolic DysFxn - elevated LVEDP & LAP. Mod Dil LA & RA.  . Falls frequently   . Hx of cardiovascular stress test 05/2014    Lexiscan Myoview (8/15): No ischemia; EF 63% - Normal Study  . Breast cancer, right breast   . Gout   . OBSTRUCTIVE SLEEP APNEA 06/20/2009    does not use cpap  . Stroke     found on MRI in Oct. 2015.   Marland Kitchen Shortness of breath dyspnea     occasional  . Depression     takes Prozac  . GERD (gastroesophageal reflux disease)     takes Prilosec  . Arthritis   .  Neuromuscular disorder     neuropathy in both legs  . Constipation   . ETOH abuse     no alcohol  since Oct. 2015  . 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 CHF (congestive heart failure)   . Supplemental oxygen dependent     hs  . Bilateral renal cysts   . Kidney disease     Stage 3    Family History  family history includes Cancer in his mother; Cancer (age of onset: 6) in his brother; Stroke in his father and mother. There is no history of Heart attack.  Prior Rehab/Hospitalizations: Had [facility] PT/RN 2 months ago from [place].  Has the patient had major surgery during 100 days prior to admission? No. However, had right side breast removed 4-6 months ago for cancer.  Current Medications   Current facility-administered medications:  . 0.9 % sodium chloride infusion, , Intravenous, Continuous, Suzette Battiest, MD, Last Rate: 10 mL/hr at 06/02/15 0730 . 0.9 % NaCl with KCl 20 mEq/ L infusion, , Intravenous, Continuous, Coralie Keens, MD, Last Rate: 10 mL/hr at 06/06/15 0834 . allopurinol (ZYLOPRIM) tablet 300 mg, 300 mg, Oral, Daily, Coralie Keens, MD, 300 mg at 06/09/15 0818 . antiseptic oral rinse (CPC / CETYLPYRIDINIUM CHLORIDE 0.05%) solution 7 mL, 7 mL, Mouth Rinse, BID, Coralie Keens, MD, 7 mL at 06/08/15 2244 . budesonide (PULMICORT) nebulizer solution 0.25 mg, 0.25 mg, Nebulization, BID, Coralie Keens, MD, 0.25 mg at 06/09/15 0733 . carvedilol (COREG) tablet 3.125 mg, 3.125 mg, Oral, BID WC, Coralie Keens, MD, 3.125 mg at 06/09/15 0819 . colchicine tablet 0.6 mg, 0.6 mg, Oral, Daily, Coralie Keens, MD, 0.6 mg at 06/09/15 0819 . diphenhydrAMINE (BENADRYL) 12.5 MG/5ML elixir 12.5 mg, 12.5 mg, Oral, Q6H PRN **OR** diphenhydrAMINE (BENADRYL) injection 12.5 mg, 12.5 mg, Intravenous, Q6H PRN, Coralie Keens, MD . enoxaparin (LOVENOX) injection 40 mg, 40 mg, Subcutaneous, Q24H, Coralie Keens, MD, 40 mg at 06/09/15 0820 . FLUoxetine (PROZAC) capsule 20 mg, 20 mg, Oral,  Daily, Coralie Keens, MD, 20 mg at 06/09/15 0819 . folic acid (FOLVITE) tablet 1 mg, 1 mg, Oral, Daily, Coralie Keens, MD, 1 mg at 06/09/15 0819 . furosemide (LASIX) tablet 80 mg, 80 mg, Oral, BID, Coralie Keens, MD, 80 mg at 06/09/15 0819 . gabapentin (NEURONTIN) capsule 300 mg, 300 mg, Oral, BID, Coralie Keens, MD, 300 mg at 06/09/15 8101 . hydrALAZINE (APRESOLINE) injection 20 mg, 20 mg, Intravenous, Q4H PRN, Rolm Bookbinder, MD, 20 mg at 06/03/15 1803 . insulin aspart (novoLOG) injection 0-20 Units, 0-20 Units, Subcutaneous, TID WC, Coralie Keens, MD, 4 Units at 06/09/15 1329 . insulin aspart (novoLOG) injection 0-5 Units, 0-5 Units, Subcutaneous, QHS, Coralie Keens, MD, 2 Units at 06/06/15 2250 . levothyroxine (SYNTHROID, LEVOTHROID) tablet 187 mcg, 187 mcg, Oral, QAC breakfast, Coralie Keens, MD, 187 mcg at 06/09/15 0817 . magnesium oxide (MAG-OX) tablet 200 mg, 200 mg, Oral, BID, Coralie Keens, MD, 200 mg at 06/09/15 0818 . morphine 2 MG/ML injection 1-4 mg, 1-4 mg, Intravenous, Q1H PRN, Coralie Keens, MD, 2 mg at 06/08/15 0900 . ondansetron (ZOFRAN-ODT) disintegrating tablet 4 mg, 4 mg, Oral, Q6H PRN, 4 mg at 06/02/15 2112 **OR** ondansetron (ZOFRAN) injection 4 mg, 4 mg, Intravenous, Q6H PRN, Coralie Keens, MD . oxyCODONE (Oxy IR/ROXICODONE) immediate release tablet 5-10 mg, 5-10 mg, Oral, Q4H PRN, Coralie Keens, MD, 10 mg at 06/09/15 0818 . pantoprazole (PROTONIX) EC tablet 40 mg, 40 mg, Oral, QHS, Cecilio Asper  Batchelder, RPH, 40 mg at 06/08/15 2244 . simethicone (MYLICON) chewable tablet 80 mg, 80 mg, Oral, Q6H PRN, Stark Klein, MD, 80 mg at 06/06/15 2011 . tamoxifen (NOLVADEX) tablet 20 mg, 20 mg, Oral, Daily, Coralie Keens, MD, 20 mg at 06/09/15 1114 . venlafaxine XR (EFFEXOR-XR) 24 hr capsule 37.5 mg, 37.5 mg, Oral, Daily, Coralie Keens, MD, 37.5 mg at 06/09/15 1114  Patients Current Diet: DIET SOFT Room service appropriate?: Yes;  Fluid consistency:: Thin  Precautions / Restrictions Precautions Precautions: Fall Other Brace/Splint: abdominal binder Restrictions Weight Bearing Restrictions: No   Has the patient had 2 or more falls or a fall with injury in the past year?No. However, had 1 fall 3 weeks ago with no injury.  Prior Activity Level Limited Community (1-2x/wk): Went out 1-2 X a week to MD appointments  Home Assistive Devices / [place] Devices/Equipment: Environmental consultant (specify type), CBG Meter Home Equipment: Walker - 2 wheels  Prior Device Use: Indicate devices/aids used by the patient prior to current illness, exacerbation or injury? Manual wheelchair and Walker  Prior Functional Level Prior Function Level of Independence: Independent with assistive device(s) Comments: using rolling walker  Self Care: Did the patient need help bathing, dressing, using the toilet or eating? Needed some help  Indoor Mobility: Did the patient need assistance with walking from room to room (with or without device)? Needed some help  Stairs: Did the patient need assistance with internal or external stairs (with or without device)? Needed some help  Functional Cognition: Did the patient need help planning regular tasks such as shopping or remembering to take medications? Needed some help  Current Functional Level Cognition  Overall Cognitive Status: No family/caregiver present to determine baseline cognitive functioning Orientation Level: Oriented to person, Disoriented to time, Disoriented to situation, Disoriented to place Following Commands: Follows multi-step commands inconsistently, Follows one step commands consistently Safety/Judgement: Decreased awareness of safety, Decreased awareness of deficits General Comments: awoke from sleep and was more confused thinking he was driving a dump truck, better after re-orientation, but then kept repeating questions about location and time throughout  session   Extremity Assessment (includes Sensation/Coordination)  Upper Extremity Assessment: Generalized weakness  Lower Extremity Assessment: Defer to PT evaluation    ADLs  Overall ADL's : Needs assistance/impaired Eating/Feeding: Set up, Sitting Grooming: Set up, Sitting Upper Body Bathing: Minimal assitance, Sitting Lower Body Bathing: Moderate assistance, +2 for physical assistance, Sit to/from stand Upper Body Dressing : Set up, Sitting Lower Body Dressing: Moderate assistance, +2 for physical assistance, Sit to/from stand Toilet Transfer: Moderate assistance, +2 for physical assistance, Stand-pivot, BSC, RW Toilet Transfer Details (indicate cue type and reason): Due to urgency completed as SPT. Pt then completed household distance ambulation with min A, +2 for chair follow.  Toileting- Clothing Manipulation and Hygiene: Sit to/from stand, Moderate assistance, +2 for physical assistance Tub/ Shower Transfer: Walk-in shower, Moderate assistance, +2 for physical assistance Tub/Shower Transfer Details (indicate cue type and reason): mod +2 physical A for sit<>stand, min A +2 equipment for chair follow for ambulating to bathroom Functional mobility during ADLs: Minimal assistance, +2 for safety/equipment, Rolling walker General ADL Comments: Pt requesting to use to toilet citing urgency at start of session. Completed as SPT mod A +2 physical A. After rest break on [facility] pt ambulated household distance with min A +2 for chair follow. O2 assessed at end of walking at 81 with pt seated in recliner. Encouraged breathing techniques with pt O2 recovering to 91-95 after about 2  minutes seated in recliner. Pt reports he lives with his daughter who is present 24/7. No family present during evaluation.    Mobility  Overal bed mobility: Needs Assistance Bed Mobility: Rolling, Sidelying to Sit Rolling: Min assist Sidelying to sit: Mod assist Supine to sit: Mod assist (assist at  trunk) Sit to supine: Mod assist (assist with bilateral LEs) General bed mobility comments: in recliner    Transfers  Overall transfer level: Needs assistance Equipment used: Rolling walker (2 wheeled) Transfers: Sit to/from Stand, Stand Pivot Transfers Sit to Stand: Mod assist, +2 physical assistance Stand pivot transfers: Mod assist, +2 physical assistance General transfer comment: from recliner to [facility]. Mod A to powerup and initial balance in standing with pt leaning posteriorly. Pt asking therpist to "lean me forward, I'm going back." With verbal and tactile cueing pt able to correct upright posture. Pt with initial difficulty coordinating movement pattern of pivoting (vs BLE weakness?).     Ambulation / Gait / Stairs / Wheelchair Mobility  Ambulation/Gait Ambulation/Gait assistance: Museum/gallery curator (Feet): 10 Feet Assistive device: Rolling walker (2 wheeled) Gait Pattern/deviations: Step-through pattern, Decreased step length - right, Decreased step length - left General Gait Details: occasional assist with rw positioning. SpO2 94% sitting and 88% after ambulation, 92% before leaving room.  Gait velocity: decreased    Posture / Balance Dynamic Sitting Balance Sitting balance - Comments: sitting without UE support about 6 minutes Balance Overall balance assessment: Needs assistance Sitting-balance support: Feet supported Sitting balance-Leahy Scale: Fair Sitting balance - Comments: sitting without UE support about 6 minutes Postural control: Posterior lean Standing balance support: Bilateral upper extremity supported, During functional activity Standing balance-Leahy Scale: Poor Standing balance comment: rw and initial assist in standing position    Special needs/care consideration BiPAP/CPAP He has CPAP but he does not use it. CPM No Continuous Drip IV KVO  Dialysis No  Life Vest No Oxygen Has 02 at night 2L [place] Special Bed No Trach Size  No Wound Vac (area) Likely will have VAC applied soon  Skin Redness on abdomen at times if not washed daily. Has abdominal incision and right breast incision with dressings.  Bowel mgmt: Last BM 06/07/15 Bladder mgmt: Urgency Diabetic mgmt: Yes, gets insulin at night.    Previous Home Environment Living Arrangements: Children, Other relatives Available Help at Discharge: Family Type of Home: House Home Layout: One level Home Access: Stairs to enter Entrance Stairs-Rails: Right, Left Entrance Stairs-Number of Steps: 3 Bathroom Shower/Tub: Gaffer, Door [employer] Toilet: Handicapped height Bathroom Accessibility: Yes How Accessible: Other (comment) [place]: Other (Comment) ("sometimes people come out to the house" ) Additional Comments: Pt with some decreased cognition, no family present to confirm home setup/PLOF.  Discharge Living Setting Plans for Discharge Living Setting: Patient's home, House, Lives with (comment) (Lives with daughter and son-in-law.) Type of Home at Discharge: House Discharge Home Layout: One level Discharge Home Access: Stairs to enter Entrance Stairs-Number of Steps: 5 step entry Does the patient have any problems obtaining your medications?: No  Social/Family/Support Systems Patient Roles: Parent (Has a daughter and a son-in-law.) Contact Information: [name] - daughter Anticipated Caregiver: daughter Anticipated Caregiver's Contact Information: [name] - dtr [phone] Ability/Limitations of Caregiver: Dtr does not work and can provide supervision at home. Caregiver Availability: 24/7 Discharge Plan Discussed with Primary Caregiver: Yes Is Caregiver In Agreement with Plan?: Yes Does Caregiver/Family have Issues with Lodging/Transportation while Pt is in Rehab?: No  Goals/Additional Needs Patient/Family Goal for Rehab:  PT mod I, OT mod I and supervision, ST mod I and supervision goals Expected  length of stay: 8-12 days Cultural Considerations: None Dietary Needs: Soft diet, thin liquids Equipment Needs: TBD Pt/Family Agrees to Admission and willing to participate: Yes (I spoke with daughter, [name], and she is agreeable.) Program Orientation Provided & Reviewed with Pt/Caregiver Including Roles & Responsibilities: Yes  Decrease burden of Care through IP rehab admission: N/A  Possible need for SNF placement upon discharge: Not planned  Patient Condition: This patient's medical and functional status has changed since the consult dated: 06/06/15 in which the Rehabilitation Physician determined and documented that the patient's condition is appropriate for intensive rehabilitative care in an inpatient rehabilitation facility. See "History of Present Illness" (above) for medical update. Functional changes are: Currently requiring mod assist for stand pivot transfers. Patient's medical and functional status update has been discussed with the Rehabilitation physician and patient remains appropriate for inpatient rehabilitation. Will admit to inpatient rehab today.  Preadmission Screen Completed By: [name], 06/09/2015 2:37 PM ______________________________________________________________________  Discussed status with Dr. Naaman Plummer on 06/09/15 at 1437 and received telephone approval for admission today.  Admission Coordinator: [name], time1437/Date08/29/16          Cosigned by: [name], MD at 06/09/2015 2:54 PM  Revision History     Date/Time User Provider Type Action   06/09/2015 2:54 PM [name], MD Physician Cosign   06/09/2015 2:40 PM [name] Diones, RN Rehab Admission Coordinator Sign

## 2015-06-09 NOTE — Progress Notes (Signed)
7 Days Post-Op  Subjective: Tolerating a diet Having BM's Getting up with PT  Objective: Vital signs in last 24 hours: Temp:  [97.5 F (36.4 C)-99.1 F (37.3 C)] 97.5 F (36.4 C) (08/29 3536) Pulse Rate:  [73-85] 74 (08/29 0613) Resp:  [18] 18 (08/29 0613) BP: (133-182)/(74-87) 137/74 mmHg (08/29 0613) SpO2:  [94 %-99 %] 98 % (08/29 0734) Last BM Date: 06/07/15  Intake/Output from previous day: 08/28 0701 - 08/29 0700 In: 1406 [P.O.:1170; I.V.:236] Out: 2150 [Urine:2150] Intake/Output this shift:    Lungs clear Wound clean  Lab Results:  No results for input(s): WBC, HGB, HCT, PLT in the last 72 hours. BMET No results for input(s): NA, K, CL, CO2, GLUCOSE, BUN, CREATININE, CALCIUM in the last 72 hours. PT/INR No results for input(s): LABPROT, INR in the last 72 hours. ABG No results for input(s): PHART, HCO3 in the last 72 hours.  Invalid input(s): PCO2, PO2  Studies/Results: No results found.  Anti-infectives: Anti-infectives    Start     Dose/Rate Route Frequency Ordered Stop   06/02/15 0650  ceFAZolin (ANCEF) IVPB 2 g/50 mL premix     2 g 100 mL/hr over 30 Minutes Intravenous On call to O.R. 06/02/15 1443 06/02/15 0916      Assessment/Plan: s/p Procedure(s): EXPLORATORY LAPAROTOMY (N/A) SMALL BOWEL RESECTION (N/A)  OK for transfer to Rehab today from a surgical standpoint if it gets approved. Continuing PT, OT, wound care  Skin around the wound is much improved so he may be ready to transition to a VAC soon.  LOS: 7 days    Wendie Diskin A 06/09/2015

## 2015-06-09 NOTE — Progress Notes (Signed)
Physical Therapy Treatment Patient Details Name: [name] MRN: [id number] DOB: 11-03-1945 Today's Date: 06/09/2015    History of Present Illness PAtient is a 69 y.o. male admitted with hx of DM/ CAD/ CABG, DM2, HTN, gout, HL, etoh abuse, afib. 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 In Oct '15 had gait dysfunction and cerebellar CVA. Hx etoh abuse. In Feb '16 had R mastectomy for male breast cancer. Presented with generalized weakness, s/p exp lap for SB resection 06/02/15.    PT Comments    Pt cont' with mild confusion and lethargy, decreased activity tolerance and endurance, decreased strength requiring assist for all mobility. Con't to recommend CIR upon d/c for maximal functional recovery.  Follow Up Recommendations  CIR     Equipment Recommendations  None recommended by PT    Recommendations for Other Services Rehab consult     Precautions / Restrictions Precautions Precautions: Fall Restrictions Weight Bearing Restrictions: No    Mobility  Bed Mobility Overal bed mobility: Needs Assistance Bed Mobility: Rolling;Sit to Supine Rolling: Min assist     Sit to supine: Max assist   General bed mobility comments: assit for LE management back into bed and to guide trunk down  Transfers Overall transfer level: Needs assistance Equipment used: Rolling walker (2 wheeled) Transfers: Sit to/from Stand Sit to Stand: Max assist Stand pivot transfers: Mod assist       General transfer comment: it took 2 attempts to achieve full standing from chair using the gait belt and feet blocked  Ambulation/Gait Ambulation/Gait assistance: Mod assist Ambulation Distance (Feet): 6 Feet (steps to chair) Assistive device: Rolling walker (2 wheeled) Gait Pattern/deviations: Step-to pattern;Wide base of support Gait velocity: decreased Gait velocity interpretation: Below normal speed for age/gender General Gait Details: assist for walker  management, v/c's for sequencing and max encouragment to amb to bed   Stairs            Wheelchair Mobility    Modified Rankin (Stroke Patients Only)       Balance           Standing balance support: Bilateral upper extremity supported Standing balance-Leahy Scale: Poor Standing balance comment: significant posterior lean without RW                    Cognition Arousal/Alertness: Lethargic Behavior During Therapy: WFL for tasks assessed/performed Overall Cognitive Status: No family/caregiver present to determine baseline cognitive functioning                 General Comments: pt very sleepy, more alert once PT got him into standing    Exercises General Exercises - Lower Extremity Long Arc Quad: AROM;Seated;Both;15 reps Hip ABduction/ADduction: AROM;Seated;Both;15 reps Hip Flexion/Marching: AROM;Seated;Both;15 reps Toe Raises: AROM;Seated;Right;15 reps    General Comments        Pertinent Vitals/Pain Pain Assessment: No/denies pain    Home Living                      Prior Function            PT Goals (current goals can now be found in the care plan section) Progress towards PT goals: Progressing toward goals    Frequency  Min 3X/week    PT Plan Current plan remains appropriate    Co-evaluation             End of Session Equipment Utilized During Treatment: Gait belt Activity Tolerance: Patient  limited by fatigue Patient left: in bed;with call bell/phone within reach     Time: 1437-1500 PT Time Calculation (min) (ACUTE ONLY): 23 min  Charges:  $Gait Training: 8-22 mins $Therapeutic Exercise: 8-22 mins                    G Codes:      [name] 06/09/2015, 4:27 PM   [name], PT, DPT Pager #: [phone] Office #: [phone]

## 2015-06-09 NOTE — Interval H&P Note (Signed)
[name] was admitted today to Inpatient Rehabilitation with the diagnosis of debility/ encephalopathy.  The patient's history has been reviewed, patient examined, and there is no change in status.  Patient continues to be appropriate for intensive inpatient rehabilitation.  I have reviewed the patient's chart and labs.  Questions were answered to the patient's satisfaction. The PAPE has been reviewed and assessment remains appropriate.  [name] T 06/09/2015, 8:27 PM

## 2015-06-09 NOTE — H&P (Signed)
Physical Medicine and Rehabilitation Admission H&P    CC: Debility  HPI:  [name] is a 69 y.o. male with history of COPD, CAD, PAF- no coumadin due to ETOH use, cerebellar CVA, incarcerated hernia with SB resection 04/7411 complicated by [facility] fistula which has not resolved after conservative management. he was admitted on 06/02/15 for Exp Lap with small bowel resection by Dr. Ninfa Linden. He has been advanced to soft diet and is having BM. He continues to have confusion post op.wound healing and plans for tansition to [facility] in the near future.  Therapy ongoing and CIR recommended due to deconditioned state.      Review of Systems  HENT: Negative for hearing loss.   Eyes: Positive for blurred vision (left due to recent cataract surgery).  Respiratory: Positive for shortness of breath (occasionally). Negative for cough.   Cardiovascular: Negative for chest pain and palpitations.  Gastrointestinal: Positive for abdominal pain. Negative for heartburn, nausea, vomiting and diarrhea.  Genitourinary:       Needs to stand to urinate.  Musculoskeletal: Positive for myalgias, back pain and falls.  Neurological: Positive for dizziness and weakness. Negative for speech change, focal weakness and headaches.  Psychiatric/Behavioral: The patient has insomnia.       Past Medical History  Diagnosis Date  . CHRONIC OBSTRUCTIVE PULMONARY DISEASE 06/20/2009  . CAROTID STENOSIS 06/20/2009    A. 08/2001 s/p L CEA;  B.   09/14/11 - Carotid U/S - 40-59% bilateral stenosis, left CEA patch angioplasty is patent  . DM 06/20/2009  . CAD 06/20/2009    A.  08/2000 - s/p CABG x 4 - LIMA-LAD, Left Radial-OM, VG-DIAG, VG-RCA;  B. Neg. MV  2010  . HYPERLIPIDEMIA 06/20/2009  . HYPERTENSION 06/20/2009  . Hypothyroidism   . Low back pain   . Asthma     as child  . Pneumonia   . Persistent atrial fibrillation     Not felt to be coumadin candidate 2/2 ETOH use.  Marland Kitchen History of tobacco abuse     remote - quit 1970    . Morbidly obese   . Chronic diastolic heart failure, NYHA class 3     Followed by [facility] --> Echo 04/2014: EF 87-86%, Diastolic DysFxn - elevated LVEDP & LAP. Mod Dil LA & RA.  . Falls frequently   . Hx of cardiovascular stress test 05/2014    Lexiscan Myoview (8/15):  No ischemia; EF 63% - Normal Study  . Breast cancer, right breast   . Gout   . OBSTRUCTIVE SLEEP APNEA 06/20/2009    does not use cpap  . Stroke     found on MRI in Oct. 2015.   Marland Kitchen Shortness of breath dyspnea     occasional  . Depression     takes Prozac  . GERD (gastroesophageal reflux disease)     takes Prilosec  . Arthritis   . Neuromuscular disorder     neuropathy in both legs  . Constipation   . ETOH abuse     no alcohol since Oct. 2015  . 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 CHF (congestive heart failure)   . Supplemental oxygen dependent     hs  . Bilateral renal cysts   . Kidney disease     Stage 3    Past Surgical History  Procedure Laterality Date  . Carotid endarterectomy  2002    left  .  Coronary artery bypass graft      x 4 - 2001  . Umbilical hernia repair N/A 01/28/2014    Procedure: HERNIA REPAIR UMBILICAL ADULT/INC;  Surgeon: Harl Bowie, MD;  Location: [place];  Service: General;  Laterality: N/A;  . Laparotomy N/A 01/28/2014    Procedure: EXPLORATORY LAPAROTOMY;  Surgeon: Harl Bowie, MD;  Location: [place];  Service: General;  Laterality: N/A;  . Bowel resection N/A 01/28/2014    Procedure: SMALL BOWEL RESECTION;  Surgeon: Harl Bowie, MD;  Location: [place];  Service: General;  Laterality: N/A;  . Hernia repair    . Colonoscopy    . Total mastectomy Right 11/11/2014    Procedure: RIGHT TOTAL MASTECTOMY;  Surgeon: Excell Seltzer, MD;  Location: [place];  Service: General;  Laterality: Right;  . Cataract extraction w/phaco Left 04/08/2015    Procedure: CATARACT EXTRACTION PHACO AND INTRAOCULAR LENS PLACEMENT ([place]);   Surgeon: Birder Robson, MD;  Location: [facility];  Service: Ophthalmology;  Laterality: Left;  [place] 00:36 AP% 23.7 CDE 8.72 fluid pack AQT#[id number] H  . Eye surgery Left     Catarct  . Laparotomy N/A 06/02/2015    Procedure: EXPLORATORY LAPAROTOMY;  Surgeon: Coralie Keens, MD;  Location: [place];  Service: General;  Laterality: N/A;  . Bowel resection N/A 06/02/2015    Procedure: SMALL BOWEL RESECTION;  Surgeon: Coralie Keens, MD;  Location[facility];  Service: General;  Laterality: N/A;    Family History  Problem Relation Age of Onset  . Stroke Mother     ?  Marland Kitchen Cancer Mother     unknown type of cancer  . Stroke Father     ?  Marland Kitchen Heart attack Neg Hx   . Cancer Brother 25    stomach cancer     Social History:  Lives with daughter. He reports that he quit smoking about 46 years ago. He has never used smokeless tobacco. He reports that he drinks a pint of alcohol daily. Per reports that he uses illicit drugs (Marijuana).    Allergies  Allergen Reactions  . Erythromycin Hives    Oramycin    Medications Prior to Admission  Medication Sig Dispense Refill  . allopurinol (ZYLOPRIM) 300 MG tablet Take 1 tablet (300 mg total) by mouth daily. 30 tablet 3  . aspirin EC 325 MG EC tablet Take 1 tablet (325 mg total) by mouth daily. 30 tablet 0  . atorvastatin (LIPITOR) 20 MG tablet Take 1 tablet (20 mg total) by mouth daily. 30 tablet 1  . carvedilol (COREG) 3.125 MG tablet Take 1 tablet (3.125 mg total) by mouth 2 (two) times daily with a meal. 60 tablet 5  . colchicine 0.6 MG tablet Take 0.6 mg by mouth daily.    Marland Kitchen FLUoxetine (PROZAC) 20 MG capsule Take 1 capsule (20 mg total) by mouth daily. 30 capsule 3  . fluticasone (FLOVENT HFA) 110 MCG/ACT inhaler Inhale 2 puffs into the lungs 2 (two) times daily.    . furosemide (LASIX) 80 MG tablet Take 1 tablet (80 mg total) by mouth 2 (two) times daily. 90 tablet 0  . gabapentin (NEURONTIN) 300 MG capsule Take 1 capsule (300 mg total) by mouth 2  (two) times daily. 60 capsule 1  . insulin glargine (LANTUS) 100 UNIT/ML injection Inject 14 Units into the skin at bedtime.    Marland Kitchen levothyroxine (SYNTHROID, LEVOTHROID) 125 MCG tablet Take 1.5 tablets (187 mcg total) by mouth daily before breakfast. 30 tablet 3  .  magnesium oxide (MAG-OX) 400 (241.3 MG) MG tablet Take 0.5 tablets (200 mg total) by mouth 2 (two) times daily. 30 tablet 0  . omeprazole (PRILOSEC) 20 MG capsule Take 1 capsule (20 mg total) by mouth daily. 30 capsule 1  . Oxycodone HCl 10 MG TABS Take 1 tablet (10 mg total) by mouth every 6 (six) hours as needed. 40 tablet 0  . potassium chloride SA (KLOR-CON M20) 20 MEQ tablet Take 2 tablets (40 mEq total) by mouth 2 (two) times daily. 120 tablet 0  . predniSONE (DELTASONE) 20 MG tablet Take 2 tablets (40 mg total) by mouth daily with breakfast. 6 tablet 0  . tamoxifen (NOLVADEX) 20 MG tablet Take 1 tablet (20 mg total) by mouth daily. 90 tablet 3  . venlafaxine XR (EFFEXOR-XR) 37.5 MG 24 hr capsule TAKE ONE CAPSULE BY MOUTH DAILY 30 capsule 3  . folic acid (FOLVITE) 1 MG tablet Take 1 tablet (1 mg total) by mouth daily. 30 tablet 1    Home: Home Living Family/patient expects to be discharged to:: Private residence Living Arrangements: Children, Other relatives Available Help at Discharge: Family Type of Home: House Home Access: Stairs to enter Technical brewer of Steps: 3 Entrance Stairs-Rails: Right, Left Home Layout: One level Bathroom Shower/Tub: Gaffer, Door [employer] Toilet: Handicapped height Bathroom Accessibility: Yes Home Equipment: Environmental consultant - 2 wheels Additional Comments: Pt with some decreased cognition, no family present to confirm home setup/PLOF.   Functional History: Prior Function Level of Independence: Independent with assistive device(s) Comments: using rolling walker  Functional Status:  Mobility: Bed Mobility Overal bed mobility: Needs Assistance Bed Mobility: Rolling, Sidelying to  Sit Rolling: Min assist Sidelying to sit: Mod assist Supine to sit: Mod assist (assist at trunk) Sit to supine: Mod assist (assist with bilateral LEs) General bed mobility comments: in recliner Transfers Overall transfer level: Needs assistance Equipment used: Rolling walker (2 wheeled) Transfers: Sit to/from Stand, Stand Pivot Transfers Sit to Stand: Mod assist, +2 physical assistance Stand pivot transfers: Mod assist, +2 physical assistance General transfer comment: from recliner to [facility]. Mod A to powerup and initial balance in standing with pt leaning posteriorly. Pt asking therpist to "lean me forward, I'm going back." With verbal and tactile cueing pt able to correct upright posture. Pt with initial difficulty coordinating movement pattern of pivoting (vs BLE weakness?).  Ambulation/Gait Ambulation/Gait assistance: Min assist Ambulation Distance (Feet): 10 Feet Assistive device: Rolling walker (2 wheeled) Gait Pattern/deviations: Step-through pattern, Decreased step length - right, Decreased step length - left General Gait Details: occasional assist with rw positioning. SpO2 94% sitting and 88% after ambulation, 92% before leaving room.  Gait velocity: decreased    ADL: ADL Overall ADL's : Needs assistance/impaired Eating/Feeding: Set up, Sitting Grooming: Set up, Sitting Upper Body Bathing: Minimal assitance, Sitting Lower Body Bathing: Moderate assistance, +2 for physical assistance, Sit to/from stand Upper Body Dressing : Set up, Sitting Lower Body Dressing: Moderate assistance, +2 for physical assistance, Sit to/from stand Toilet Transfer: Moderate assistance, +2 for physical assistance, Stand-pivot, BSC, RW Toilet Transfer Details (indicate cue type and reason): Due to urgency completed as SPT. Pt then completed household distance ambulation with min A, +2 for chair follow.  Toileting- Clothing Manipulation and Hygiene: Sit to/from stand, Moderate assistance, +2 for physical  assistance Tub/ Shower Transfer: Walk-in shower, Moderate assistance, +2 for physical assistance Tub/Shower Transfer Details (indicate cue type and reason): mod +2 physical A for sit<>stand, min A +2 equipment for chair follow for ambulating  to bathroom Functional mobility during ADLs: Minimal assistance, +2 for safety/equipment, Rolling walker General ADL Comments: Pt requesting to use to toilet citing urgency at start of session. Completed as SPT mod A +2 physical A. After rest break on [facility] pt ambulated household distance with min A +2 for chair follow. O2 assessed at end of walking at 81 with pt seated in recliner. Encouraged breathing techniques with pt O2 recovering to 91-95 after about 2 minutes seated in recliner. Pt reports he lives with his daughter who is present 24/7. No family present during evaluation.  Cognition: Cognition Overall Cognitive Status: No family/caregiver present to determine baseline cognitive functioning Orientation Level: Oriented to person, Disoriented to time, Disoriented to situation, Disoriented to place Cognition Arousal/Alertness: Awake/alert Behavior During Therapy: WFL for tasks assessed/performed Overall Cognitive Status: No family/caregiver present to determine baseline cognitive functioning Area of Impairment: Safety/judgement, Memory, Problem solving, Following commands Orientation Level: Disoriented to, Place, Time Memory: Decreased short-term memory Following Commands: Follows multi-step commands inconsistently, Follows one step commands consistently Safety/Judgement: Decreased awareness of safety, Decreased awareness of deficits Problem Solving: Slow processing, Requires verbal cues, Requires tactile cues General Comments: awoke from sleep and was more confused thinking he was driving a dump truck, better after re-orientation, but then kept repeating questions about location and time throughout session   Blood pressure 137/74, pulse 74, temperature  97.5 F (36.4 C), temperature source Oral, resp. rate 18, height 5\' 8"  (1.727 m), weight 120.3 kg (265 lb 3.4 oz), SpO2 98 %. Physical Exam  Nursing note and vitals reviewed. Constitutional: He is oriented to person, place, and time. He appears well-developed and well-nourished.  HENT:  Head: Normocephalic and atraumatic.  Eyes: Conjunctivae are normal. Pupils are equal, round, and reactive to light.  Neck: Normal range of motion. Neck supple. No tracheal deviation present. No thyromegaly present.  Cardiovascular: Normal rate.  An irregular rhythm present.  Respiratory: Effort normal. No respiratory distress. He has decreased breath sounds in the left lower field. He has no wheezes. He has no rales. He exhibits no tenderness.  GI: Soft. Bowel sounds are normal. He exhibits no distension. There is tenderness.  Abdominal wound clean/packed  Musculoskeletal: He exhibits edema.  Bruises on bilateral knees with dry abrasion left knee. Both knees generally tender to touch. Also with low back tenderness BLE with vascular changes below the knees to the ankles. 1 pedal/ankle edema.   Neurological: He is alert and oriented to person, place, and time.  Speech clear. Reports that he feels "drunk". Generally lethargic. Able to follow simple one step commands. CN exam inconsistent because he was not consistently alert---no focal findings. Decreased insight and awareness. Oriented to hospital. Attention span 10 seconds or less. UE: 3+ deltoid, bicep, tricep, wrist, 4HI. LE: 2+HF, 3-KE and 4- ADF,APF.  Decreased LT in both feet and ankles.   Skin: Skin is warm and dry.  Psychiatric: His speech is normal. He is slowed.  Generally cooperative but distracted, slightly confused    Results for orders placed or performed during the hospital encounter of 06/02/15 (from the past 48 hour(s))  Glucose, capillary     Status: Abnormal   Collection Time: 06/07/15  4:55 PM  Result Value Ref Range   Glucose-Capillary 148  (H) 65 - 99 mg/dL  Glucose, capillary     Status: Abnormal   Collection Time: 06/07/15  9:34 PM  Result Value Ref Range   Glucose-Capillary 148 (H) 65 - 99 mg/dL   Comment 1 Notify RN  Comment 2 Document in Chart   Glucose, capillary     Status: Abnormal   Collection Time: 06/08/15  7:43 AM  Result Value Ref Range   Glucose-Capillary 120 (H) 65 - 99 mg/dL  Glucose, capillary     Status: Abnormal   Collection Time: 06/08/15 11:47 AM  Result Value Ref Range   Glucose-Capillary 212 (H) 65 - 99 mg/dL  Glucose, capillary     Status: Abnormal   Collection Time: 06/08/15  4:51 PM  Result Value Ref Range   Glucose-Capillary 204 (H) 65 - 99 mg/dL  Glucose, capillary     Status: Abnormal   Collection Time: 06/08/15  9:19 PM  Result Value Ref Range   Glucose-Capillary 167 (H) 65 - 99 mg/dL  Glucose, capillary     Status: Abnormal   Collection Time: 06/09/15  8:09 AM  Result Value Ref Range   Glucose-Capillary 128 (H) 65 - 99 mg/dL  Glucose, capillary     Status: Abnormal   Collection Time: 06/09/15 11:52 AM  Result Value Ref Range   Glucose-Capillary 184 (H) 65 - 99 mg/dL   No results found.     Medical Problem List and Plan: 1. Functional deficits secondary to Debility and encephlopathy after small bowel resection 2.  DVT Prophylaxis/Anticoagulation: Pharmaceutical: Lovenox is indicated due to immobiltiy 3. Chronic back pain/Pain Management: Will decrease oxycodone to 5 mg prn for now as lethargy today seemed to coincide with receiving 2 of the oxycodone 5mg  tabs 4. Mood:  LCSW to follow up with patient for evaluation and support.  5. Neuropsych: This patient is not yet capable of making decisions on his own behalf. 6. Skin/Wound Care: Daily dressing changes per surgery recommendations to abdomen. Consider transition to vac soon?.  7. Fluids/Electrolytes/Nutrition: Monitor I/O.  Check lytes in am. Encourage adequate po 8.  COPD: Uses Oxygen at nights. Flovent bid with rescue  inhaler prn. Encourage OOB 9. A fib: On coreg bid. Monitor heart rate bid.  10 H/o Gout: Continue allopurinol and colchicine.  11. DM type 2?:  Hgb A1C- 7.1 last year.  Monitor BS with ac/hs checks and use SSI for now. May require oral agent if BS due not improve.  12. H/o depression: On Prozac and Effexor.provide ego support 13. Chronic diastolic CHF: Monitor weights daily as well as I's and O's. Low salt diet. Elevate BLE when seated and ACE wrap for edema control.  On Lasix bid.  14. Leucocytosis: Monitor for signs of infection.  Recheck CBC in am.  15. ABLA: Encourage intake. Check follow up labs in am.  16. Encephalopathy: H/o alcohol abuse. Monitor mentation for now.  17. Dizziness: Check orthostatic BP each day. Monitor tolerance for position changes with therapy..  18. Constipation: Has not had BM in two days. Will add Senna at nights. ?PO intake adequate    Post Admission Physician Evaluation: 1. Functional deficits secondary  to Debility and encephalopathy after small bowel resection.  2. Patient is admitted to receive collaborative, interdisciplinary care between the physiatrist, rehab nursing staff, and therapy team. 3. Patient's level of medical complexity and substantial therapy needs in context of that medical necessity cannot be provided at a lesser intensity of care such as a SNF. 4. Patient has experienced substantial functional loss from his/her baseline which was documented above under the "Functional History" and "Functional Status" headings.  Judging by the patient's diagnosis, physical exam, and functional history, the patient has potential for functional progress which will result in measurable gains while  on inpatient rehab.  These gains will be of substantial and practical use upon discharge  in facilitating mobility and self-care at the household level. 5. Physiatrist will provide 24 hour management of medical needs as well as oversight of the therapy plan/treatment and  provide guidance as appropriate regarding the interaction of the two. 6. 24 hour rehab nursing will assist with bladder management, bowel management, safety, skin/wound care, disease management, medication administration, pain management and patient education  and help integrate therapy concepts, techniques,education, etc. 7. PT will assess and treat for/with: Lower extremity strength, range of motion, stamina, balance, functional mobility, safety, adaptive techniques and equipment, NMR, cognitive perceptual rx, family education, motivation.   Goals are: supervision. 8. OT will assess and treat for/with: ADL's, functional mobility, safety, upper extremity strength, adaptive techniques and equipment, NMR, cognitive perceptual awareness, family education, wound mgt, ego support.   Goals are: supervision to min assist. Therapy may not proceed with showering this patient. 9. SLP will assess and treat for/with: cognition and communication.  Goals are: supervision. 10. Case Management and Social Worker will assess and treat for psychological issues and discharge planning. 11. Team conference will be held weekly to assess progress toward goals and to determine barriers to discharge. 12. Patient will receive at least 3 hours of therapy per day at least 5 days per week. 13. ELOS: 14-17 days       14. Prognosis:  good     [name], MD, [place] [facility] 06/09/2015   06/09/2015

## 2015-06-09 NOTE — H&P (View-Only) (Signed)
Physical Medicine and Rehabilitation Admission H&P    CC: Debility  HPI:  [name] is a 69 y.o. male with history of COPD, CAD, PAF- no coumadin due to ETOH use, cerebellar CVA, incarcerated hernia with SB resection 03/3874 complicated by [facility] fistula which has not resolved after conservative management. he was admitted on 06/02/15 for Exp Lap with small bowel resection by Dr. Ninfa Linden. He has been advanced to soft diet and is having BM. He continues to have confusion post op.wound healing and plans for tansition to [facility] in the near future.  Therapy ongoing and CIR recommended due to deconditioned state.      Review of Systems  HENT: Negative for hearing loss.   Eyes: Positive for blurred vision (left due to recent cataract surgery).  Respiratory: Positive for shortness of breath (occasionally). Negative for cough.   Cardiovascular: Negative for chest pain and palpitations.  Gastrointestinal: Positive for abdominal pain. Negative for heartburn, nausea, vomiting and diarrhea.  Genitourinary:       Needs to stand to urinate.  Musculoskeletal: Positive for myalgias, back pain and falls.  Neurological: Positive for dizziness and weakness. Negative for speech change, focal weakness and headaches.  Psychiatric/Behavioral: The patient has insomnia.       Past Medical History  Diagnosis Date  . CHRONIC OBSTRUCTIVE PULMONARY DISEASE 06/20/2009  . CAROTID STENOSIS 06/20/2009    A. 08/2001 s/p L CEA;  B.   09/14/11 - Carotid U/S - 40-59% bilateral stenosis, left CEA patch angioplasty is patent  . DM 06/20/2009  . CAD 06/20/2009    A.  08/2000 - s/p CABG x 4 - LIMA-LAD, Left Radial-OM, VG-DIAG, VG-RCA;  B. Neg. MV  2010  . HYPERLIPIDEMIA 06/20/2009  . HYPERTENSION 06/20/2009  . Hypothyroidism   . Low back pain   . Asthma     as child  . Pneumonia   . Persistent atrial fibrillation     Not felt to be coumadin candidate 2/2 ETOH use.  Marland Kitchen History of tobacco abuse     remote - quit 1970    . Morbidly obese   . Chronic diastolic heart failure, NYHA class 3     Followed by [facility] Clinic --> Echo 04/2014: EF 64-33%, Diastolic DysFxn - elevated LVEDP & LAP. Mod Dil LA & RA.  . Falls frequently   . Hx of cardiovascular stress test 05/2014    Lexiscan Myoview (8/15):  No ischemia; EF 63% - Normal Study  . Breast cancer, right breast   . Gout   . OBSTRUCTIVE SLEEP APNEA 06/20/2009    does not use cpap  . Stroke     found on MRI in Oct. 2015.   Marland Kitchen Shortness of breath dyspnea     occasional  . Depression     takes Prozac  . GERD (gastroesophageal reflux disease)     takes Prilosec  . Arthritis   . Neuromuscular disorder     neuropathy in both legs  . Constipation   . ETOH abuse     no alcohol since Oct. 2015  . 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 CHF (congestive heart failure)   . Supplemental oxygen dependent     hs  . Bilateral renal cysts   . Kidney disease     Stage 3    Past Surgical History  Procedure Laterality Date  . Carotid endarterectomy  2002    left  .  Coronary artery bypass graft      x 4 - 2001  . Umbilical hernia repair N/A 01/28/2014    Procedure: HERNIA REPAIR UMBILICAL ADULT/INC;  Surgeon: Harl Bowie, MD;  Location: [place];  Service: General;  Laterality: N/A;  . Laparotomy N/A 01/28/2014    Procedure: EXPLORATORY LAPAROTOMY;  Surgeon: Harl Bowie, MD;  Location: [place];  Service: General;  Laterality: N/A;  . Bowel resection N/A 01/28/2014    Procedure: SMALL BOWEL RESECTION;  Surgeon: Harl Bowie, MD;  Location: [place];  Service: General;  Laterality: N/A;  . Hernia repair    . Colonoscopy    . Total mastectomy Right 11/11/2014    Procedure: RIGHT TOTAL MASTECTOMY;  Surgeon: Excell Seltzer, MD;  Location: [place];  Service: General;  Laterality: Right;  . Cataract extraction w/phaco Left 04/08/2015    Procedure: CATARACT EXTRACTION PHACO AND INTRAOCULAR LENS PLACEMENT ([place]);   Surgeon: Birder Robson, MD;  Location: [facility];  Service: Ophthalmology;  Laterality: Left;  [place] 00:36 AP% 23.7 CDE 8.72 fluid pack SHF#[id number] H  . Eye surgery Left     Catarct  . Laparotomy N/A 06/02/2015    Procedure: EXPLORATORY LAPAROTOMY;  Surgeon: Coralie Keens, MD;  Location: [place];  Service: General;  Laterality: N/A;  . Bowel resection N/A 06/02/2015    Procedure: SMALL BOWEL RESECTION;  Surgeon: Coralie Keens, MD;  Location: [facility];  Service: General;  Laterality: N/A;    Family History  Problem Relation Age of Onset  . Stroke Mother     ?  Marland Kitchen Cancer Mother     unknown type of cancer  . Stroke Father     ?  Marland Kitchen Heart attack Neg Hx   . Cancer Brother 36    stomach cancer     Social History:  Lives with daughter. He reports that he quit smoking about 46 years ago. He has never used smokeless tobacco. He reports that he drinks a pint of alcohol daily. Per reports that he uses illicit drugs (Marijuana).    Allergies  Allergen Reactions  . Erythromycin Hives    Oramycin    Medications Prior to Admission  Medication Sig Dispense Refill  . allopurinol (ZYLOPRIM) 300 MG tablet Take 1 tablet (300 mg total) by mouth daily. 30 tablet 3  . aspirin EC 325 MG EC tablet Take 1 tablet (325 mg total) by mouth daily. 30 tablet 0  . atorvastatin (LIPITOR) 20 MG tablet Take 1 tablet (20 mg total) by mouth daily. 30 tablet 1  . carvedilol (COREG) 3.125 MG tablet Take 1 tablet (3.125 mg total) by mouth 2 (two) times daily with a meal. 60 tablet 5  . colchicine 0.6 MG tablet Take 0.6 mg by mouth daily.    Marland Kitchen FLUoxetine (PROZAC) 20 MG capsule Take 1 capsule (20 mg total) by mouth daily. 30 capsule 3  . fluticasone (FLOVENT HFA) 110 MCG/ACT inhaler Inhale 2 puffs into the lungs 2 (two) times daily.    . furosemide (LASIX) 80 MG tablet Take 1 tablet (80 mg total) by mouth 2 (two) times daily. 90 tablet 0  . gabapentin (NEURONTIN) 300 MG capsule Take 1 capsule (300 mg total) by mouth 2  (two) times daily. 60 capsule 1  . insulin glargine (LANTUS) 100 UNIT/ML injection Inject 14 Units into the skin at bedtime.    Marland Kitchen levothyroxine (SYNTHROID, LEVOTHROID) 125 MCG tablet Take 1.5 tablets (187 mcg total) by mouth daily before breakfast. 30 tablet 3  .  magnesium oxide (MAG-OX) 400 (241.3 MG) MG tablet Take 0.5 tablets (200 mg total) by mouth 2 (two) times daily. 30 tablet 0  . omeprazole (PRILOSEC) 20 MG capsule Take 1 capsule (20 mg total) by mouth daily. 30 capsule 1  . Oxycodone HCl 10 MG TABS Take 1 tablet (10 mg total) by mouth every 6 (six) hours as needed. 40 tablet 0  . potassium chloride SA (KLOR-CON M20) 20 MEQ tablet Take 2 tablets (40 mEq total) by mouth 2 (two) times daily. 120 tablet 0  . predniSONE (DELTASONE) 20 MG tablet Take 2 tablets (40 mg total) by mouth daily with breakfast. 6 tablet 0  . tamoxifen (NOLVADEX) 20 MG tablet Take 1 tablet (20 mg total) by mouth daily. 90 tablet 3  . venlafaxine XR (EFFEXOR-XR) 37.5 MG 24 hr capsule TAKE ONE CAPSULE BY MOUTH DAILY 30 capsule 3  . folic acid (FOLVITE) 1 MG tablet Take 1 tablet (1 mg total) by mouth daily. 30 tablet 1    Home: Home Living Family/patient expects to be discharged to:: Private residence Living Arrangements: Children, Other relatives Available Help at Discharge: Family Type of Home: House Home Access: Stairs to enter Technical brewer of Steps: 3 Entrance Stairs-Rails: Right, Left Home Layout: One level Bathroom Shower/Tub: Gaffer, Door [employer] Toilet: Handicapped height Bathroom Accessibility: Yes Home Equipment: Environmental consultant - 2 wheels Additional Comments: Pt with some decreased cognition, no family present to confirm home setup/PLOF.   Functional History: Prior Function Level of Independence: Independent with assistive device(s) Comments: using rolling walker  Functional Status:  Mobility: Bed Mobility Overal bed mobility: Needs Assistance Bed Mobility: Rolling, Sidelying to  Sit Rolling: Min assist Sidelying to sit: Mod assist Supine to sit: Mod assist (assist at trunk) Sit to supine: Mod assist (assist with bilateral LEs) General bed mobility comments: in recliner Transfers Overall transfer level: Needs assistance Equipment used: Rolling walker (2 wheeled) Transfers: Sit to/from Stand, Stand Pivot Transfers Sit to Stand: Mod assist, +2 physical assistance Stand pivot transfers: Mod assist, +2 physical assistance General transfer comment: from recliner to [employer]. Mod A to powerup and initial balance in standing with pt leaning posteriorly. Pt asking therpist to "lean me forward, I'm going back." With verbal and tactile cueing pt able to correct upright posture. Pt with initial difficulty coordinating movement pattern of pivoting (vs BLE weakness?).  Ambulation/Gait Ambulation/Gait assistance: Min assist Ambulation Distance (Feet): 10 Feet Assistive device: Rolling walker (2 wheeled) Gait Pattern/deviations: Step-through pattern, Decreased step length - right, Decreased step length - left General Gait Details: occasional assist with rw positioning. SpO2 94% sitting and 88% after ambulation, 92% before leaving room.  Gait velocity: decreased    ADL: ADL Overall ADL's : Needs assistance/impaired Eating/Feeding: Set up, Sitting Grooming: Set up, Sitting Upper Body Bathing: Minimal assitance, Sitting Lower Body Bathing: Moderate assistance, +2 for physical assistance, Sit to/from stand Upper Body Dressing : Set up, Sitting Lower Body Dressing: Moderate assistance, +2 for physical assistance, Sit to/from stand Toilet Transfer: Moderate assistance, +2 for physical assistance, Stand-pivot, BSC, RW Toilet Transfer Details (indicate cue type and reason): Due to urgency completed as SPT. Pt then completed household distance ambulation with min A, +2 for chair follow.  Toileting- Clothing Manipulation and Hygiene: Sit to/from stand, Moderate assistance, +2 for physical  assistance Tub/ Shower Transfer: Walk-in shower, Moderate assistance, +2 for physical assistance Tub/Shower Transfer Details (indicate cue type and reason): mod +2 physical A for sit<>stand, min A +2 equipment for chair follow for ambulating  to bathroom Functional mobility during ADLs: Minimal assistance, +2 for safety/equipment, Rolling walker General ADL Comments: Pt requesting to use to toilet citing urgency at start of session. Completed as SPT mod A +2 physical A. After rest break on [facility] pt ambulated household distance with min A +2 for chair follow. O2 assessed at end of walking at 81 with pt seated in recliner. Encouraged breathing techniques with pt O2 recovering to 91-95 after about 2 minutes seated in recliner. Pt reports he lives with his daughter who is present 24/7. No family present during evaluation.  Cognition: Cognition Overall Cognitive Status: No family/caregiver present to determine baseline cognitive functioning Orientation Level: Oriented to person, Disoriented to time, Disoriented to situation, Disoriented to place Cognition Arousal/Alertness: Awake/alert Behavior During Therapy: WFL for tasks assessed/performed Overall Cognitive Status: No family/caregiver present to determine baseline cognitive functioning Area of Impairment: Safety/judgement, Memory, Problem solving, Following commands Orientation Level: Disoriented to, Place, Time Memory: Decreased short-term memory Following Commands: Follows multi-step commands inconsistently, Follows one step commands consistently Safety/Judgement: Decreased awareness of safety, Decreased awareness of deficits Problem Solving: Slow processing, Requires verbal cues, Requires tactile cues General Comments: awoke from sleep and was more confused thinking he was driving a dump truck, better after re-orientation, but then kept repeating questions about location and time throughout session   Blood pressure 137/74, pulse 74, temperature  97.5 F (36.4 C), temperature source Oral, resp. rate 18, height 5\' 8"  (1.727 m), weight 120.3 kg (265 lb 3.4 oz), SpO2 98 %. Physical Exam  Nursing note and vitals reviewed. Constitutional: He is oriented to person, place, and time. He appears well-developed and well-nourished.  HENT:  Head: Normocephalic and atraumatic.  Eyes: Conjunctivae are normal. Pupils are equal, round, and reactive to light.  Neck: Normal range of motion. Neck supple. No tracheal deviation present. No thyromegaly present.  Cardiovascular: Normal rate.  An irregular rhythm present.  Respiratory: Effort normal. No respiratory distress. He has decreased breath sounds in the left lower field. He has no wheezes. He has no rales. He exhibits no tenderness.  GI: Soft. Bowel sounds are normal. He exhibits no distension. There is tenderness.  Abdominal wound clean/packed  Musculoskeletal: He exhibits edema.  Bruises on bilateral knees with dry abrasion left knee. Both knees generally tender to touch. Also with low back tenderness BLE with vascular changes below the knees to the ankles. 1 pedal/ankle edema.   Neurological: He is alert and oriented to person, place, and time.  Speech clear. Reports that he feels "drunk". Generally lethargic. Able to follow simple one step commands. CN exam inconsistent because he was not consistently alert---no focal findings. Decreased insight and awareness. Oriented to hospital. Attention span 10 seconds or less. UE: 3+ deltoid, bicep, tricep, wrist, 4HI. LE: 2+HF, 3-KE and 4- ADF,APF.  Decreased LT in both feet and ankles.   Skin: Skin is warm and dry.  Psychiatric: His speech is normal. He is slowed.  Generally cooperative but distracted, slightly confused    Results for orders placed or performed during the hospital encounter of 06/02/15 (from the past 48 hour(s))  Glucose, capillary     Status: Abnormal   Collection Time: 06/07/15  4:55 PM  Result Value Ref Range   Glucose-Capillary 148  (H) 65 - 99 mg/dL  Glucose, capillary     Status: Abnormal   Collection Time: 06/07/15  9:34 PM  Result Value Ref Range   Glucose-Capillary 148 (H) 65 - 99 mg/dL   Comment 1 Notify RN  Comment 2 Document in Chart   Glucose, capillary     Status: Abnormal   Collection Time: 06/08/15  7:43 AM  Result Value Ref Range   Glucose-Capillary 120 (H) 65 - 99 mg/dL  Glucose, capillary     Status: Abnormal   Collection Time: 06/08/15 11:47 AM  Result Value Ref Range   Glucose-Capillary 212 (H) 65 - 99 mg/dL  Glucose, capillary     Status: Abnormal   Collection Time: 06/08/15  4:51 PM  Result Value Ref Range   Glucose-Capillary 204 (H) 65 - 99 mg/dL  Glucose, capillary     Status: Abnormal   Collection Time: 06/08/15  9:19 PM  Result Value Ref Range   Glucose-Capillary 167 (H) 65 - 99 mg/dL  Glucose, capillary     Status: Abnormal   Collection Time: 06/09/15  8:09 AM  Result Value Ref Range   Glucose-Capillary 128 (H) 65 - 99 mg/dL  Glucose, capillary     Status: Abnormal   Collection Time: 06/09/15 11:52 AM  Result Value Ref Range   Glucose-Capillary 184 (H) 65 - 99 mg/dL   No results found.     Medical Problem List and Plan: 1. Functional deficits secondary to Debility and encephlopathy after small bowel resection 2.  DVT Prophylaxis/Anticoagulation: Pharmaceutical: Lovenox is indicated due to immobiltiy 3. Chronic back pain/Pain Management: Will decrease oxycodone to 5 mg prn for now as lethargy today seemed to coincide with receiving 2 of the oxycodone 5mg  tabs 4. Mood:  LCSW to follow up with patient for evaluation and support.  5. Neuropsych: This patient is not yet capable of making decisions on his own behalf. 6. Skin/Wound Care: Daily dressing changes per surgery recommendations to abdomen. Consider transition to vac soon?.  7. Fluids/Electrolytes/Nutrition: Monitor I/O.  Check lytes in am. Encourage adequate po 8.  COPD: Uses Oxygen at nights. Flovent bid with rescue  inhaler prn. Encourage OOB 9. A fib: On coreg bid. Monitor heart rate bid.  10 H/o Gout: Continue allopurinol and colchicine.  11. DM type 2?:  Hgb A1C- 7.1 last year.  Monitor BS with ac/hs checks and use SSI for now. May require oral agent if BS due not improve.  12. H/o depression: On Prozac and Effexor.provide ego support 13. Chronic diastolic CHF: Monitor weights daily as well as I's and O's. Low salt diet. Elevate BLE when seated and ACE wrap for edema control.  On Lasix bid.  14. Leucocytosis: Monitor for signs of infection.  Recheck CBC in am.  15. ABLA: Encourage intake. Check follow up labs in am.  16. Encephalopathy: H/o alcohol abuse. Monitor mentation for now.  17. Dizziness: Check orthostatic BP each day. Monitor tolerance for position changes with therapy..  18. Constipation: Has not had BM in two days. Will add Senna at nights. ?PO intake adequate    Post Admission Physician Evaluation: 1. Functional deficits secondary  to Debility and encephalopathy after small bowel resection.  2. Patient is admitted to receive collaborative, interdisciplinary care between the physiatrist, rehab nursing staff, and therapy team. 3. Patient's level of medical complexity and substantial therapy needs in context of that medical necessity cannot be provided at a lesser intensity of care such as a SNF. 4. Patient has experienced substantial functional loss from his/her baseline which was documented above under the "Functional History" and "Functional Status" headings.  Judging by the patient's diagnosis, physical exam, and functional history, the patient has potential for functional progress which will result in measurable gains while  on inpatient rehab.  These gains will be of substantial and practical use upon discharge  in facilitating mobility and self-care at the household level. 5. Physiatrist will provide 24 hour management of medical needs as well as oversight of the therapy plan/treatment and  provide guidance as appropriate regarding the interaction of the two. 6. 24 hour rehab nursing will assist with bladder management, bowel management, safety, skin/wound care, disease management, medication administration, pain management and patient education  and help integrate therapy concepts, techniques,education, etc. 7. PT will assess and treat for/with: Lower extremity strength, range of motion, stamina, balance, functional mobility, safety, adaptive techniques and equipment, NMR, cognitive perceptual rx, family education, motivation.   Goals are: supervision. 8. OT will assess and treat for/with: ADL's, functional mobility, safety, upper extremity strength, adaptive techniques and equipment, NMR, cognitive perceptual awareness, family education, wound mgt, ego support.   Goals are: supervision to min assist. Therapy may not proceed with showering this patient. 9. SLP will assess and treat for/with: cognition and communication.  Goals are: supervision. 10. Case Management and Social Worker will assess and treat for psychological issues and discharge planning. 11. Team conference will be held weekly to assess progress toward goals and to determine barriers to discharge. 12. Patient will receive at least 3 hours of therapy per day at least 5 days per week. 13. ELOS: 14-17 days       14. Prognosis:  good     [name], MD, [place] [facility] 06/09/2015   06/09/2015

## 2015-06-09 NOTE — Progress Notes (Signed)
[name] Staggers, MD Physician Signed Physical Medicine and Rehabilitation Consult Note 06/06/2015 2:57 PM  Related encounter: Admission (Current) from 06/02/2015 in [place] Collapse All        Physical Medicine and Rehabilitation Consult  Reason for Consult: Deconditioning Referring Physician: Dr. Ninfa Linden.    HPI: [name] is a 69 y.o. male with history of COPD, CAD, PAF- no coumadin due to ETOH use, cerebellar CVA, incarcerated hernia with SB resection [id number] complicated by [facility] fistula which has not resolved after conservative management. he was admitted on 06/02/15 for Exp Lap with small bowel resection by Dr. Ninfa Linden. He has been advanced to soft diet and continues to have confusion post op. Therapy initiated and patient noted to be deconditioned. CIR recommended for follow up therapy.    Review of Systems  HENT: Negative for hearing loss.  Eyes: Positive for blurred vision (left eye ).  Respiratory: Positive for shortness of breath (occasionally). Negative for cough and wheezing.   Uses oxygen at nights  Cardiovascular: Negative for chest pain and palpitations.  Gastrointestinal: Positive for abdominal pain and constipation (chronic). Negative for nausea and vomiting.  Musculoskeletal: Positive for back pain and joint pain. Negative for myalgias.  Neurological: Positive for dizziness and sensory change. Negative for headaches.  Psychiatric/Behavioral: The patient has insomnia.      Past Medical History  Diagnosis Date  . CHRONIC OBSTRUCTIVE PULMONARY DISEASE 06/20/2009  . CAROTID STENOSIS 06/20/2009    A. 08/2001 s/p L CEA; B. 09/14/11 - Carotid U/S - 40-59% bilateral stenosis, left CEA patch angioplasty is patent  . DM 06/20/2009  . CAD 06/20/2009    A. 08/2000 - s/p CABG x 4 - LIMA-LAD, Left Radial-OM, VG-DIAG, VG-RCA; B. Neg. MV 2010  . HYPERLIPIDEMIA 06/20/2009  .  HYPERTENSION 06/20/2009  . Hypothyroidism   . Low back pain   . Asthma     as child  . Pneumonia   . Persistent atrial fibrillation     Not felt to be coumadin candidate 2/2 ETOH use.  Marland Kitchen History of tobacco abuse     remote - quit 1970  . Morbidly obese   . Chronic diastolic heart failure, NYHA class 3     Followed by [facility] Clinic --> Echo 04/2014: EF 96-28%, Diastolic DysFxn - elevated LVEDP & LAP. Mod Dil LA & RA.  . Falls frequently   . Hx of cardiovascular stress test 05/2014    Lexiscan Myoview (8/15): No ischemia; EF 63% - Normal Study  . Breast cancer, right breast   . Gout   . OBSTRUCTIVE SLEEP APNEA 06/20/2009    does not use cpap  . Stroke     found on MRI in Oct. 2015.   Marland Kitchen Shortness of breath dyspnea     occasional  . Depression     takes Prozac  . GERD (gastroesophageal reflux disease)     takes Prilosec  . Arthritis   . Neuromuscular disorder     neuropathy in both legs  . Constipation   . ETOH abuse     no alcohol since Oct. 2015  . 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 CHF (congestive heart failure)   . Supplemental oxygen dependent     hs  . Bilateral renal cysts   . Kidney disease     Stage 3    Past Surgical History  Procedure  Laterality Date  . Carotid endarterectomy  2002    left  . Coronary artery bypass graft      x 4 - 2001  . Umbilical hernia repair N/A 01/28/2014    Procedure: HERNIA REPAIR UMBILICAL ADULT/INC; Surgeon: Harl Bowie, MD; Location: [place]; Service: General; Laterality: N/A;  . Laparotomy N/A 01/28/2014    Procedure: EXPLORATORY LAPAROTOMY; Surgeon: Harl Bowie, MD; Location: [place]; Service: General; Laterality: N/A;  . Bowel resection N/A 01/28/2014    Procedure: SMALL BOWEL RESECTION; Surgeon:  Harl Bowie, MD; Location: [place]; Service: General; Laterality: N/A;  . Hernia repair    . Colonoscopy    . Total mastectomy Right 11/11/2014    Procedure: RIGHT TOTAL MASTECTOMY; Surgeon: Excell Seltzer, MD; Location: [place]; Service: General; Laterality: Right;  . Cataract extraction w/phaco Left 04/08/2015    Procedure: CATARACT EXTRACTION PHACO AND INTRAOCULAR LENS PLACEMENT ([place]); Surgeon: Birder Robson, MD; Location: [facility]; Service: Ophthalmology; Laterality: Left; [place] 00:36 AP% 23.7 CDE 8.72 fluid pack MEQ#[id number] H  . Eye surgery Left     Catarct  . Laparotomy N/A 06/02/2015    Procedure: EXPLORATORY LAPAROTOMY; Surgeon: Coralie Keens, MD; Location: [place]; Service: General; Laterality: N/A;  . Bowel resection N/A 06/02/2015    Procedure: SMALL BOWEL RESECTION; Surgeon: Coralie Keens, MD; Location[facility]; Service: General; Laterality: N/A;    Family History  Problem Relation Age of Onset  . Stroke Mother     ?  Marland Kitchen Cancer Mother     unknown type of cancer  . Stroke Father     ?  Marland Kitchen Heart attack Neg Hx   . Cancer Brother 41    stomach cancer    Social History: Lives with daughter. He reports that he quit smoking about 46 years ago. He has never used smokeless tobacco. He reports that he drinks a pint of alcohol daily. Per reports that he uses illicit drugs (Marijuana).    Allergies  Allergen Reactions  . Erythromycin Hives    Oramycin    Medications Prior to Admission  Medication Sig Dispense Refill  . allopurinol (ZYLOPRIM) 300 MG tablet Take 1 tablet (300 mg total) by mouth daily. 30 tablet 3  . aspirin EC 325 MG EC tablet Take 1 tablet (325 mg total) by mouth daily. 30 tablet 0  . atorvastatin (LIPITOR) 20 MG tablet Take 1 tablet (20 mg total) by mouth daily. 30 tablet 1  . carvedilol (COREG) 3.125 MG tablet Take 1  tablet (3.125 mg total) by mouth 2 (two) times daily with a meal. 60 tablet 5  . colchicine 0.6 MG tablet Take 0.6 mg by mouth daily.    Marland Kitchen FLUoxetine (PROZAC) 20 MG capsule Take 1 capsule (20 mg total) by mouth daily. 30 capsule 3  . fluticasone (FLOVENT HFA) 110 MCG/ACT inhaler Inhale 2 puffs into the lungs 2 (two) times daily.    . furosemide (LASIX) 80 MG tablet Take 1 tablet (80 mg total) by mouth 2 (two) times daily. 90 tablet 0  . gabapentin (NEURONTIN) 300 MG capsule Take 1 capsule (300 mg total) by mouth 2 (two) times daily. 60 capsule 1  . insulin glargine (LANTUS) 100 UNIT/ML injection Inject 14 Units into the skin at bedtime.    Marland Kitchen levothyroxine (SYNTHROID, LEVOTHROID) 125 MCG tablet Take 1.5 tablets (187 mcg total) by mouth daily before breakfast. 30 tablet 3  . magnesium oxide (MAG-OX) 400 (241.3 MG) MG tablet Take 0.5 tablets (200 mg total) by mouth 2 (  two) times daily. 30 tablet 0  . omeprazole (PRILOSEC) 20 MG capsule Take 1 capsule (20 mg total) by mouth daily. 30 capsule 1  . Oxycodone HCl 10 MG TABS Take 1 tablet (10 mg total) by mouth every 6 (six) hours as needed. 40 tablet 0  . potassium chloride SA (KLOR-CON M20) 20 MEQ tablet Take 2 tablets (40 mEq total) by mouth 2 (two) times daily. 120 tablet 0  . predniSONE (DELTASONE) 20 MG tablet Take 2 tablets (40 mg total) by mouth daily with breakfast. 6 tablet 0  . tamoxifen (NOLVADEX) 20 MG tablet Take 1 tablet (20 mg total) by mouth daily. 90 tablet 3  . venlafaxine XR (EFFEXOR-XR) 37.5 MG 24 hr capsule TAKE ONE CAPSULE BY MOUTH DAILY 30 capsule 3  . folic acid (FOLVITE) 1 MG tablet Take 1 tablet (1 mg total) by mouth daily. 30 tablet 1    Home: Home Living Family/patient expects to be discharged to:: Private residence Living Arrangements: Children, Other relatives Available Help at Discharge: Family Type of Home: House Home Access: Stairs to  enter Technical brewer of Steps: 3 Entrance Stairs-Rails: Right, Left Home Layout: One level Home Equipment: Walker - 2 wheels Additional Comments: Patient with noted confusion, reliability of subjective reports unknown  Functional History: Prior Function Level of Independence: Independent with assistive device(s) Comments: using rolling walker Functional Status:  Mobility: Bed Mobility Overal bed mobility: Needs Assistance Bed Mobility: Rolling, Sidelying to Sit Rolling: Min assist Sidelying to sit: Mod assist Supine to sit: Mod assist (assist at trunk) Sit to supine: Mod assist (assist with bilateral LEs) General bed mobility comments: assist for legs off bed, pt able to lift trunk with rail Transfers Overall transfer level: Needs assistance Equipment used: Rolling walker (2 wheeled) Transfers: Sit to/from Stand Sit to Stand: Mod assist, +2 physical assistance Stand pivot transfers: Mod assist General transfer comment: patient requesting second helper so paged nuse tech who came to help pt stand so he could urinate with assist with urinal, then sat back on bed and chair brought closer, so assisted with stand pivot with walker to chair with increased time and mod cues Ambulation/Gait Ambulation/Gait assistance: Min assist Ambulation Distance (Feet): 10 Feet Assistive device: Rolling walker (2 wheeled) Gait Pattern/deviations: Step-through pattern, Decreased step length - right, Decreased step length - left General Gait Details: occasional assist with rw positioning. SpO2 94% sitting and 88% after ambulation, 92% before leaving room.  Gait velocity: decreased    ADL:    Cognition: Cognition Overall Cognitive Status: No family/caregiver present to determine baseline cognitive functioning Orientation Level: Oriented to person, Oriented to place, Oriented to time Cognition Arousal/Alertness: Awake/alert Behavior During Therapy: WFL for tasks  assessed/performed Overall Cognitive Status: No family/caregiver present to determine baseline cognitive functioning Area of Impairment: Orientation, Memory, Problem solving Orientation Level: Disoriented to, Place, Time Memory: Decreased short-term memory Problem Solving: Slow processing, Requires verbal cues, Requires tactile cues General Comments: awoke from sleep and was more confused thinking he was driving a dump truck, better after re-orientation, but then kept repeating questions about location and time throughout session  Blood pressure 111/58, pulse 80, temperature 97.7 F (36.5 C), temperature source Oral, resp. rate 18, height 5\' 8"  (1.727 m), weight 120.3 kg (265 lb 3.4 oz), SpO2 95 %. Physical Exam  Vitals reviewed. Constitutional: He is oriented to person, place, and time. He appears well-developed and well-nourished. He is easily aroused.  Fatigued and kept falling asleep  HENT:  Head: Normocephalic and atraumatic.  Eyes: Conjunctivae are normal. Pupils are equal, round, and reactive to light.  Neck: Normal range of motion. Neck supple.  Cardiovascular: Normal rate and regular rhythm.  Respiratory: Effort normal and breath sounds normal. No respiratory distress. He has no wheezes.  GI: Soft. Bowel sounds are normal. He exhibits no distension. There is tenderness.  Musculoskeletal: He exhibits edema.  Neurological: He is alert, oriented to person, place, and time and easily aroused.  Speech clear. Able to follow simple commands.  Skin: Skin is warm and dry. No rash noted. No erythema.     Lab Results Last 24 Hours    Results for orders placed or performed during the hospital encounter of 06/02/15 (from the past 24 hour(s))  Glucose, capillary Status: Abnormal   Collection Time: 06/05/15 6:05 PM  Result Value Ref Range   Glucose-Capillary 179 (H) 65 - 99 mg/dL  Glucose, capillary Status: Abnormal   Collection Time: 06/05/15 10:50 PM   Result Value Ref Range   Glucose-Capillary 189 (H) 65 - 99 mg/dL   Comment 1 Notify RN   Glucose, capillary Status: Abnormal   Collection Time: 06/06/15 8:28 AM  Result Value Ref Range   Glucose-Capillary 103 (H) 65 - 99 mg/dL  Glucose, capillary Status: Abnormal   Collection Time: 06/06/15 12:39 PM  Result Value Ref Range   Glucose-Capillary 194 (H) 65 - 99 mg/dL      Imaging Results (Last 48 hours)    No results found.    Assessment/Plan: Diagnosis: debility and encephalopathy after small bowel resection 1. Does the need for close, 24 hr/day medical supervision in concert with the patient's rehab needs make it unreasonable for this patient to be served in a less intensive setting? Yes 2. Co-Morbidities requiring supervision/potential complications: wounds, dm, htn, chf, copd 3. Due to bladder management, bowel management, safety, skin/wound care, disease management, medication administration, pain management and patient education, does the patient require 24 hr/day rehab nursing? Yes 4. Does the patient require coordinated care of a physician, rehab nurse, PT (1-2 hrs/day, 5 days/week), OT (1-2 hrs/day, 5 days/week) and SLP (potentially 1-2 hrs/day, 5 days/week) to address physical and functional deficits in the context of the above medical diagnosis(es)? Yes Addressing deficits in the following areas: balance, endurance, locomotion, strength, transferring, bowel/bladder control, bathing, dressing, feeding, grooming, toileting, cognition and psychosocial support 5. Can the patient actively participate in an intensive therapy program of at least 3 hrs of therapy per day at least 5 days per week? Yes 6. The potential for patient to make measurable gains while on inpatient rehab is excellent 7. Anticipated functional outcomes upon discharge from inpatient rehab are modified independent with PT, modified independent and supervision with OT, modified  independent and supervision with SLP. 8. Estimated rehab length of stay to reach the above functional goals is: 8-12 days 9. Does the patient have adequate social supports and living environment to accommodate these discharge functional goals? Yes 10. Anticipated D/C setting: Home 11. Anticipated post D/C treatments: [place] therapy 12. Overall Rehab/Functional Prognosis: excellent  RECOMMENDATIONS: This patient's condition is appropriate for continued rehabilitative care in the following setting: CIR Patient has agreed to participate in recommended program. Potentially Note that insurance prior authorization may be required for reimbursement for recommended care.  Comment: Rehab Admissions Coordinator to follow up.  Thanks,  [name] Staggers, MD, [facility]     06/06/2015       Revision History     Date/Time User Provider Type Action   06/06/2015 4:52 PM  [name] Staggers, MD Physician Sign   06/06/2015 3:57 PM Bary Leriche, PA-C Physician Assistant Share   View Details Report       Routing History     Date/Time From To Method   06/06/2015 4:52 PM [name] Staggers, MD [name] Staggers, MD In Basket   06/06/2015 4:52 PM [name] Staggers, MD Jene Every, MD Fax

## 2015-06-09 NOTE — Progress Notes (Signed)
Rehab admissions - Evaluated for possible admission.  I spoke with patient's daughter, [name], by telephone.  Dtr would like inpatient rehab admission.  She can provide supervision at home for patient.  Bed available and will admit to acute inpatient rehab today.  Call me for questions.  #[phone]

## 2015-06-09 NOTE — Care Management Important Message (Signed)
Important Message  Patient Details  Name: [name] MRN: [id number] Date of Birth: May 30, 1946   Medicare Important Message Given:  Yes-third notification given    Delorse Lek 06/09/2015, 10:29 AM

## 2015-06-09 NOTE — Discharge Summary (Signed)
Physician Discharge Summary  Patient ID: [name] MRN: [id number] DOB/AGE: 71-Oct-1947 71 y.o.  Admit date: 06/02/2015 Discharge date: 06/09/2015  Admission Diagnoses:  Discharge Diagnoses:  Active Problems:   Enterocutaneous fistula deconditioning  Discharged Condition: good  Hospital Course: Was admitted for elective surgery on 06/01/70.  Taken post-op to step-down.  Wound left open and he started undergoing wet to dry NS dressing changes.  Foley was removed POD#2 and he was transferred to the regular surgical floor.  Clear liquids were started.  His diet was slowly advanced. PT/OT was asked to evaluate the patient.   Given his deconditioning and mild confusion, CIR was recommended.  On the day of transfer, he was tolerating a regular diet and having BM's  Consults: rehabilitation medicine  Significant Diagnostic Studies:   Treatments: surgery: exploratory laparotomy with small bowel resection, incisional hernia repair  Discharge Exam: Blood pressure 137/74, pulse 74, temperature 97.5 F (36.4 C), temperature source Oral, resp. rate 18, height 5\' 8"  (1.727 m), weight 120.3 kg (265 lb 3.4 oz), SpO2 98 %. General appearance: cooperative and no distress Resp: clear to auscultation bilaterally Cardio: regularly irregular rhythm Incision/Wound: wound open with erythema of the surrounding skin which was large preop but improved with fistula excision.  Could be developing some pseudomonas in the wound.  Disposition: transfer to inpatient rehab    Medication List    TAKE these medications        allopurinol 300 MG tablet  Commonly known as:  ZYLOPRIM  Take 1 tablet (300 mg total) by mouth daily.     aspirin 325 MG EC tablet  Take 1 tablet (325 mg total) by mouth daily.     atorvastatin 20 MG tablet  Commonly known as:  LIPITOR  Take 1 tablet (20 mg total) by mouth daily.     carvedilol 3.125 MG tablet  Commonly known as:  COREG  Take 1 tablet (3.125 mg total) by mouth  2 (two) times daily with a meal.     colchicine 0.6 MG tablet  Take 0.6 mg by mouth daily.     FLUoxetine 20 MG capsule  Commonly known as:  PROZAC  Take 1 capsule (20 mg total) by mouth daily.     fluticasone 110 MCG/ACT inhaler  Commonly known as:  FLOVENT HFA  Inhale 2 puffs into the lungs 2 (two) times daily.     folic acid 1 MG tablet  Commonly known as:  FOLVITE  Take 1 tablet (1 mg total) by mouth daily.     furosemide 80 MG tablet  Commonly known as:  LASIX  Take 1 tablet (80 mg total) by mouth 2 (two) times daily.     gabapentin 300 MG capsule  Commonly known as:  NEURONTIN  Take 1 capsule (300 mg total) by mouth 2 (two) times daily.     insulin glargine 100 UNIT/ML injection  Commonly known as:  LANTUS  Inject 14 Units into the skin at bedtime.     levothyroxine 125 MCG tablet  Commonly known as:  SYNTHROID, LEVOTHROID  Take 1.5 tablets (187 mcg total) by mouth daily before breakfast.     magnesium oxide 400 (241.3 MG) MG tablet  Commonly known as:  MAG-OX  Take 0.5 tablets (200 mg total) by mouth 2 (two) times daily.     omeprazole 20 MG capsule  Commonly known as:  PRILOSEC  Take 1 capsule (20 mg total) by mouth daily.     Oxycodone HCl 10 MG Tabs  Take 1 tablet (10 mg total) by mouth every 6 (six) hours as needed.     potassium chloride SA 20 MEQ tablet  Commonly known as:  KLOR-CON M20  Take 2 tablets (40 mEq total) by mouth 2 (two) times daily.     predniSONE 20 MG tablet  Commonly known as:  DELTASONE  Take 2 tablets (40 mg total) by mouth daily with breakfast.     tamoxifen 20 MG tablet  Commonly known as:  NOLVADEX  Take 1 tablet (20 mg total) by mouth daily.     venlafaxine XR 37.5 MG 24 hr capsule  Commonly known as:  EFFEXOR-XR  TAKE ONE CAPSULE BY MOUTH DAILY           Follow-up Information    Follow up with [facility] A, MD. Schedule an appointment as soon as possible for a visit in 2 weeks.   Specialty:  General Surgery    Why:  For wound re-check   Contact information:   [address] [place] [address] [phone]       Signed: Harl Bowie 06/09/2015, 1:05 PM

## 2015-06-10 ENCOUNTER — Inpatient Hospital Stay (HOSPITAL_COMMUNITY): Payer: Medicare Other | Admitting: Occupational Therapy

## 2015-06-10 ENCOUNTER — Inpatient Hospital Stay (HOSPITAL_COMMUNITY): Payer: Medicare Other | Admitting: Speech Pathology

## 2015-06-10 ENCOUNTER — Inpatient Hospital Stay (HOSPITAL_COMMUNITY): Payer: Medicare Other | Admitting: Physical Therapy

## 2015-06-10 LAB — CBC WITH DIFFERENTIAL/PLATELET
Basophils Absolute: 0 10*3/uL (ref 0.0–0.1)
Basophils Relative: 0 % (ref 0–1)
EOS ABS: 0.3 10*3/uL (ref 0.0–0.7)
EOS PCT: 4 % (ref 0–5)
HCT: 31.2 % — ABNORMAL LOW (ref 39.0–52.0)
Hemoglobin: 9.6 g/dL — ABNORMAL LOW (ref 13.0–17.0)
LYMPHS ABS: 1.3 10*3/uL (ref 0.7–4.0)
Lymphocytes Relative: 20 % (ref 12–46)
MCH: 30.8 pg (ref 26.0–34.0)
MCHC: 30.8 g/dL (ref 30.0–36.0)
MCV: 100 fL (ref 78.0–100.0)
MONO ABS: 0.7 10*3/uL (ref 0.1–1.0)
MONOS PCT: 10 % (ref 3–12)
Neutro Abs: 4.4 10*3/uL (ref 1.7–7.7)
Neutrophils Relative %: 66 % (ref 43–77)
PLATELETS: 140 10*3/uL — AB (ref 150–400)
RBC: 3.12 MIL/uL — ABNORMAL LOW (ref 4.22–5.81)
RDW: 15.6 % — ABNORMAL HIGH (ref 11.5–15.5)
WBC: 6.6 10*3/uL (ref 4.0–10.5)

## 2015-06-10 LAB — GLUCOSE, CAPILLARY
GLUCOSE-CAPILLARY: 176 mg/dL — AB (ref 65–99)
GLUCOSE-CAPILLARY: 157 mg/dL — AB (ref 65–99)
GLUCOSE-CAPILLARY: 240 mg/dL — AB (ref 65–99)
Glucose-Capillary: 187 mg/dL — ABNORMAL HIGH (ref 65–99)

## 2015-06-10 LAB — COMPREHENSIVE METABOLIC PANEL
ALK PHOS: 57 U/L (ref 38–126)
ALT: 11 U/L — ABNORMAL LOW (ref 17–63)
ANION GAP: 9 (ref 5–15)
AST: 17 U/L (ref 15–41)
Albumin: 2.1 g/dL — ABNORMAL LOW (ref 3.5–5.0)
BUN: 20 mg/dL (ref 6–20)
CALCIUM: 8.2 mg/dL — AB (ref 8.9–10.3)
CHLORIDE: 92 mmol/L — AB (ref 101–111)
CO2: 31 mmol/L (ref 22–32)
Creatinine, Ser: 0.81 mg/dL (ref 0.61–1.24)
GFR calc non Af Amer: >60 mL/min (ref >60)
Glucose, Bld: 205 mg/dL — ABNORMAL HIGH (ref 65–99)
Potassium: 3.4 mmol/L — ABNORMAL LOW (ref 3.5–5.1)
SODIUM: 132 mmol/L — AB (ref 135–145)
Total Bilirubin: 0.7 mg/dL (ref 0.3–1.2)
Total Protein: 5.9 g/dL — ABNORMAL LOW (ref 6.5–8.1)

## 2015-06-10 LAB — OCCULT BLOOD X 1 CARD TO LAB, STOOL: Fecal Occult Bld: NEGATIVE

## 2015-06-10 MED ORDER — ALBUTEROL SULFATE (2.5 MG/3ML) 0.083% IN NEBU
2.5000 mg | INHALATION_SOLUTION | Freq: Three times a day (TID) | RESPIRATORY_TRACT | Status: DC
  Administered 2015-06-10 – 2015-06-11 (×6): 2.5 mg via RESPIRATORY_TRACT
  Filled 2015-06-10 (×6): qty 3

## 2015-06-10 MED ORDER — POTASSIUM CHLORIDE CRYS ER 20 MEQ PO TBCR
20.0000 meq | EXTENDED_RELEASE_TABLET | Freq: Every day | ORAL | Status: DC
  Administered 2015-06-10 – 2015-06-12 (×3): 20 meq via ORAL
  Filled 2015-06-10 (×3): qty 1

## 2015-06-10 NOTE — Progress Notes (Signed)
Patient information reviewed and entered into eRehab system by Boubacar Lerette, RN, CRRN, PPS Coordinator.  Information including medical coding and functional independence measure will be reviewed and updated through discharge.     Per nursing patient was given "Data Collection Information Summary for Patients in Inpatient Rehabilitation Facilities with attached "Privacy Act Statement-Health Care Records" upon admission.  

## 2015-06-10 NOTE — Evaluation (Signed)
Speech Language Pathology Assessment and Plan  Patient Details  Name: [name] MRN: [id number] Date of Birth: [birth year]  SLP Diagnosis: Cognitive Impairments;Speech and Language deficits  Rehab Potential: Good ELOS: 14-17 days    Today's Date: 06/10/2015 SLP Individual Time: 9030-0923 SLP Individual Time Calculation (min): 58 min   Problem List:  Patient Active Problem List   Diagnosis Date Noted  . Encephalopathy, metabolic   . Hypomagnesemia 05/27/2015  . History of male breast cancer, s/p R mastectomy Jan 2016 05/27/2015  . General weakness 05/27/2015  . Generalized weakness 05/26/2015  . Hypokalemia 10/03/2014  . Congestive heart failure 10/02/2014  . Lower extremity pain, left 10/02/2014  . Diabetes mellitus type 2, controlled 10/02/2014  . Enterocutaneous fistula   . Hypertension 08/30/2014  . COPD (chronic obstructive pulmonary disease) 08/30/2014  . Intracerebral hemorrhage 08/01/2014  . Osteoarthritis of both hands 07/30/2014  . Thrush, oral 07/28/2014  . Debility 07/25/2014  . Diabetic polyneuropathy associated with type 2 diabetes mellitus 07/25/2014  . Gout flare 07/24/2014  . Pre-operative cardiovascular examination 07/15/2014  . Family history of malignant neoplasm of gastrointestinal tract 06/12/2014  . Panniculitis 04/14/2013  . Seasonal allergies 12/20/2012  . L1 vertebral fracture 09/07/2012  . Hypothyroidism 08/30/2012  . ETOH abuse   . PAF (paroxysmal atrial fibrillation)   . Chronic diastolic heart failure   . DM (diabetes mellitus) type II uncontrolled, periph vascular disorder 06/20/2009  . Obstructive sleep apnea 06/20/2009  . CAROTID STENOSIS 06/20/2009  . CHRONIC OBSTRUCTIVE PULMONARY DISEASE 06/20/2009  . Coronary atherosclerosis of native coronary artery 06/20/2009  . Hypderlipidemia   . Hypertensive heart disease    Past Medical History:  Past Medical History  Diagnosis Date  . CHRONIC OBSTRUCTIVE PULMONARY DISEASE 06/20/2009   . CAROTID STENOSIS 06/20/2009    A. 08/2001 s/p L CEA;  B.   09/14/11 - Carotid U/S - 40-59% bilateral stenosis, left CEA patch angioplasty is patent  . DM 06/20/2009  . CAD 06/20/2009    A.  08/2000 - s/p CABG x 4 - LIMA-LAD, Left Radial-OM, VG-DIAG, VG-RCA;  B. Neg. MV  2010  . HYPERLIPIDEMIA 06/20/2009  . HYPERTENSION 06/20/2009  . Hypothyroidism   . Low back pain   . Asthma     as child  . Pneumonia   . Persistent atrial fibrillation     Not felt to be coumadin candidate 2/2 ETOH use.  Marland Kitchen History of tobacco abuse     remote - quit 1970  . Morbidly obese   . Chronic diastolic heart failure, NYHA class 3     Followed by [facility] --> Echo 04/2014: EF 30-07%, Diastolic DysFxn - elevated LVEDP & LAP. Mod Dil LA & RA.  . Falls frequently   . Hx of cardiovascular stress test 05/2014    Lexiscan Myoview (8/15):  No ischemia; EF 63% - Normal Study  . Breast cancer, right breast   . Gout   . OBSTRUCTIVE SLEEP APNEA 06/20/2009    does not use cpap  . Stroke     found on MRI in Oct. 2015.   Marland Kitchen Shortness of breath dyspnea     occasional  . Depression     takes Prozac  . GERD (gastroesophageal reflux disease)     takes Prilosec  . Arthritis   . Neuromuscular disorder     neuropathy in both legs  . Constipation   . ETOH abuse     no alcohol since Oct. 2015  . Fistula of intestine  to abdominal wall     around the belly button--'drains quite much"  . CHF (congestive heart failure)   . Supplemental oxygen dependent     hs  . Bilateral renal cysts   . Kidney disease     Stage 3   Past Surgical History:  Past Surgical History  Procedure Laterality Date  . Carotid endarterectomy  2002    left  . Coronary artery bypass graft      x 4 - 2001  . Umbilical hernia repair N/A 01/28/2014    Procedure: HERNIA REPAIR UMBILICAL ADULT/INC;  Surgeon: Harl , MD;  Location: [place];  Service: General;  Laterality: N/A;  . Laparotomy N/A 01/28/2014    Procedure: EXPLORATORY LAPAROTOMY;   Surgeon: Harl , MD;  Location: [place];  Service: General;  Laterality: N/A;  . Bowel resection N/A 01/28/2014    Procedure: SMALL BOWEL RESECTION;  Surgeon: Harl , MD;  Location: [place];  Service: General;  Laterality: N/A;  . Hernia repair    . Colonoscopy    . Total mastectomy Right 11/11/2014    Procedure: RIGHT TOTAL MASTECTOMY;  Surgeon: Excell Seltzer, MD;  Location: [place];  Service: General;  Laterality: Right;  . Cataract extraction w/phaco Left 04/08/2015    Procedure: CATARACT EXTRACTION PHACO AND INTRAOCULAR LENS PLACEMENT ([place]);  Surgeon: Birder Robson, MD;  Location: [facility];  Service: Ophthalmology;  Laterality: Left;  [place] 00:36 AP% 23.7 CDE 8.72 fluid pack [id number] H  . Eye surgery Left     Catarct  . Laparotomy N/A 06/02/2015    Procedure: EXPLORATORY LAPAROTOMY;  Surgeon: Coralie Keens, MD;  Location: [place];  Service: General;  Laterality: N/A;  . Bowel resection N/A 06/02/2015    Procedure: SMALL BOWEL RESECTION;  Surgeon: Coralie Keens, MD;  Location: [place];  Service: General;  Laterality: N/A;    Assessment / Plan / Recommendation Clinical Impression [name] is a 69 y.o. male with history of COPD, CAD, PAF- no coumadin due to ETOH use, cerebellar CVA in 2013, incarcerated hernia with SB resection 52/8413 complicated by [facility] fistula which has not resolved after conservative management. he was admitted on 06/02/2015 for Exp Lap with small bowel resection by Dr. Ninfa Linden.  He has been advanced to soft diet and is having BM. He continues to have confusion post op. wound healing and plans for transition to [facility] in the near future.  Therapy ongoing and CIR recommended due to deconditioned state.   Patient was admitted to [facility] 06/09/2015 and demonstrates severe cognitive impairments characterized by impaired sustained attention impacting storage and retrieval of information therefore impacting intellectual awareness of  deficits and ability to solve basic problems related to self-care safely.  Patient also demonstrates difficulty with word finding due to retrieval difficulty as well as fluctuating speech intelligibility suspected to be impacted by attention.  Patient would benefit from skilled SLP intervention in order to maximize his functional independence prior to discharge. Anticipate patient will require 24 hour supervision at home and follow up SLP services.      Skilled Therapeutic Interventions          Cognitive-linguistic evaluation completed with Max multimodal cues for sustained attention to topic of conversation as well as task.  SLP shared results and recommendations with patient who will need information reinforced, no family present.     SLP Assessment  Patient will need skilled Speech Lanaguage Pathology Services during CIR admission    Recommendations  Oral  Care Recommendations: Oral care BID Patient destination: Home Follow up Recommendations: 24 hour supervision/assistance;Home Health SLP Equipment Recommended: None recommended by SLP    SLP Frequency 3 to 5 out of 7 days   SLP Treatment/Interventions Cognitive remediation/compensation;Cueing hierarchy;Environmental controls;Functional tasks;Internal/external aids;Patient/family education;Speech/Language facilitation;Therapeutic Activities   Pain Pain Assessment Pain Assessment: No/denies pain Prior Functioning Cognitive/Linguistic Baseline: Baseline deficits Baseline deficit details: memory Type of Home: House  Lives With: Daughter Available Help at Discharge: Family Education: ?6th grade or when he was 69 years old  Vocation: Retired  Function:  Rankin assist level: Understands basic 25 - 49% of the time/ requires cueing 50 - 75% of the time  Expression   Expression assist level: Expresses basic 50 - 74% of the time/requires cueing 25 - 49% of the time. Needs  to repeat parts of sentences.  Social Interaction Social Interaction assist level: Interacts appropriately 75 - 89% of the time - Needs redirection for appropriate language or to initiate interaction.  Problem Solving Problem solving assist level: Solves basic 25 - 49% of the time - needs direction more than half the time to initiate, plan or complete simple activities  Memory Memory assist level: Recognizes or recalls less than 25% of the time/requires cueing greater than 75% of the time   Short Term Goals: Week 1: SLP Short Term Goal 1 (Week 1): Patient will sustain attention to basic functional tasks for 45-60 seconds with Mod verbal cues for redirection  SLP Short Term Goal 2 (Week 1): Patient will recall and verbally identify 2 cognitive and 2 physical deficits with Mod question cues SLP Short Term Goal 3 (Week 1): Patient will demonstrate consistent use (over 3 consecutive sessions) of external aids for re-orientation with Min question cues  SLP Short Term Goal 4 (Week 1): Patient will demonstrate basic problem solving during completion of functional tasks with Mod question cues  SLP Short Term Goal 5 (Week 1): Patient will maintain topic of conversation for 3-4 turns in 70% of opportunities with Mod verbal cues for redirection  SLP Short Term Goal 6 (Week 1): Patient will utilize description and or gestures to express basic information during moments of anomia with Mod vebral cues  Refer to Care Plan for Long Term Goals  Recommendations for other services: None  Discharge Criteria: Patient will be discharged from SLP if patient refuses treatment 3 consecutive times without medical reason, if treatment goals not met, if there is a change in medical status, if patient makes no progress towards goals or if patient is discharged from hospital.  The above assessment, treatment plan, treatment alternatives and goals were discussed and mutually agreed upon: by patient  Gunnar Fusi, M.A.,  CCC-SLP [phone]  [place] 06/10/2015, 10:14 AM

## 2015-06-10 NOTE — Progress Notes (Signed)
[name] PHYSICAL MEDICINE & REHABILITATION     PROGRESS NOTE    Subjective/Complaints: Awake and alert. "i'm ready to get home". Attributed yesterday's lethargy to getting IV morphine.  ROS: Pt denies fever, rash/itching, headache, blurred or double vision, nausea, vomiting, abdominal pain, diarrhea, chest pain, shortness of breath, palpitations, dysuria, dizziness, neck or back pain, bleeding, anxiety, or depression   Objective: Vital Signs: Blood pressure 133/70, pulse 86, temperature 98 F (36.7 C), temperature source Oral, resp. rate 18, SpO2 100 %. No results found.  Recent Labs  06/10/15 0555  WBC 6.6  HGB 9.6*  HCT 31.2*  PLT 140*    Recent Labs  06/10/15 0555  NA 132*  K 3.4*  CL 92*  GLUCOSE 205*  BUN 20  CREATININE 0.81  CALCIUM 8.2*   CBG (last 3)   Recent Labs  06/09/15 1750 06/09/15 2101 06/10/15 0658  GLUCAP 221* 225* 187*    Wt Readings from Last 3 Encounters:  06/05/15 120.3 kg (265 lb 3.4 oz)  05/29/15 106.641 kg (235 lb 1.6 oz)  04/08/15 110.224 kg (243 lb)    Physical Exam:  Constitutional: He is oriented to person, place, and time. He appears well-developed and well-nourished.  HENT:  Head: Normocephalic and atraumatic.  Eyes: Conjunctivae are normal. Pupils are equal, round, and reactive to light.  Neck: Normal range of motion. Neck supple. No tracheal deviation present. No thyromegaly present.  Cardiovascular: Normal rate. An irregular rhythm present.  Respiratory: Effort normal. No respiratory distress. He has decreased breath sounds in the left lower field. He has no wheezes. He has no rales. He exhibits no tenderness.  GI: Soft. Bowel sounds are normal. He exhibits no distension. There is mild tenderness.  Abdominal wound clean with beefy red granulation tissue  Musculoskeletal: He exhibits edema.  Bruises on bilateral knees with dry abrasion left knee. Both knees generally tender to touch. Also with low back tenderness  BLE with vascular changes below the knees to the ankles. 1 pedal/ankle edema.  Neurological: He is alert and oriented to person, place, and time.  Speech clear. More alert.  Able to follow simple one step commands. Recalled being "drunk" yesterday. CN exam without focal findings. Decreased insight and awareness. Oriented to hospital. Attention span 10 seconds or less. UE: 3+ deltoid, bicep, tricep, wrist, 4HI. LE: 2+HF, 3-KE and 4- ADF,APF. Decreased LT in both feet and ankles.  Skin: Skin is warm and dry.  Psychiatric: His speech is normal. He is slowed.  Generally cooperative but still distracted, less confused   Assessment/Plan: 1. Functional deficits secondary to encephalopathy/debility which require 3+ hours per day of interdisciplinary therapy in a comprehensive inpatient rehab setting. Physiatrist is providing close team supervision and 24 hour management of active medical problems listed below. Physiatrist and rehab team continue to assess barriers to discharge/monitor patient progress toward functional and medical goals.  Function:  Bathing Bathing position      Bathing parts      Bathing assist        Upper Body Dressing/Undressing Upper body dressing                    Upper body assist        Lower Body Dressing/Undressing Lower body dressing                                  Lower body assist  Toileting Toileting          Toileting assist     Transfers Chair/bed Physiological scientist Comprehension    Expression    Social Interaction    Problem Solving    Memory     Medical Problem List and Plan: 1. Functional deficits secondary to Debility and encephlopathy after small bowel resection  -begin therapies today 2. DVT Prophylaxis/Anticoagulation: Pharmaceutical: Lovenox is indicated due to immobiltiy 3. Chronic back pain/Pain Management: limiting  oxycodone to 5 mg prn for now due to AMS.  -limit other neurosedating meds as possible 4. Mood: LCSW to follow up with patient for evaluation and support.  5. Neuropsych: This patient is not yet capable of making decisions on his own behalf. 6. Skin/Wound Care: Daily dressing changes. Consider transition to vac.  7. Fluids/Electrolytes/Nutrition: Monitor I/O. I personally reviewed the patient's labs today. Replace potassium 8. COPD:   Oxygen at nights, prn during the day. Flovent bid with rescue inhaler prn. Encourage OOB 9. A fib: On coreg bid. HR is controlled at present. 10 H/o Gout: Continue allopurinol and colchicine.  11. DM type 2?: Hgb A1C- 7.1 last year. Monitor BS with ac/hs checks and use SSI for now. consider oral agent if BS due not improve over the next 24-48 hours.  12. H/o depression: On Prozac and Effexor.provide ego support 13. Chronic diastolic CHF: Monitor weights daily as well as I's and O's. Low salt diet. Elevate BLE when seated and ACE wrap for edema control. On Lasix bid.  14. Leucocytosis: Monitor for signs of infection. CBC stable.  15. ABLA: Encourage intake. hgb drop today from 8/24.  MCV high but will check stools, follow up tomorrow to confirm. 16. Encephalopathy: H/o alcohol abuse. Monitor mentation for now.  17. Dizziness: Check orthostatic BP each day and we will monitor tolerance for position changes with therapy.  18. Constipation: had bm last night  -senna  LOS (Days) 1 A FACE TO FACE EVALUATION WAS PERFORMED  SWARTZ,ZACHARY T 06/10/2015 7:46 AM

## 2015-06-10 NOTE — Evaluation (Signed)
Physical Therapy Assessment and Plan  Patient Details  Name: [name] MRN: [id number] Date of Birth: May 29, 1946  PT Diagnosis: Coordination disorder, Difficulty walking, Muscle weakness and Pain in abdomen Rehab Potential: Fair ELOS: 7-9   Today's Date: 06/10/2015 PT Individual Time: 1300-1400 PT Individual Time Calculation (min): 60 min    Problem List:  Patient Active Problem List   Diagnosis Date Noted  . Encephalopathy, metabolic   . Hypomagnesemia 05/27/2015  . History of male breast cancer, s/p R mastectomy Jan 2016 05/27/2015  . General weakness 05/27/2015  . Generalized weakness 05/26/2015  . Hypokalemia 10/03/2014  . Congestive heart failure 10/02/2014  . Lower extremity pain, left 10/02/2014  . Diabetes mellitus type 2, controlled 10/02/2014  . Enterocutaneous fistula   . Hypertension 08/30/2014  . COPD (chronic obstructive pulmonary disease) 08/30/2014  . Intracerebral hemorrhage 08/01/2014  . Osteoarthritis of both hands 07/30/2014  . Thrush, oral 07/28/2014  . Debility 07/25/2014  . Diabetic polyneuropathy associated with type 2 diabetes mellitus 07/25/2014  . Gout flare 07/24/2014  . Pre-operative cardiovascular examination 07/15/2014  . Family history of malignant neoplasm of gastrointestinal tract 06/12/2014  . Panniculitis 04/14/2013  . Seasonal allergies 12/20/2012  . L1 vertebral fracture 09/07/2012  . Hypothyroidism 08/30/2012  . ETOH abuse   . PAF (paroxysmal atrial fibrillation)   . Chronic diastolic heart failure   . DM (diabetes mellitus) type II uncontrolled, periph vascular disorder 06/20/2009  . Obstructive sleep apnea 06/20/2009  . CAROTID STENOSIS 06/20/2009  . CHRONIC OBSTRUCTIVE PULMONARY DISEASE 06/20/2009  . Coronary atherosclerosis of native coronary artery 06/20/2009  . Hypderlipidemia   . Hypertensive heart disease     Past Medical History:  Past Medical History  Diagnosis Date  . CHRONIC OBSTRUCTIVE PULMONARY DISEASE  06/20/2009  . CAROTID STENOSIS 06/20/2009    A. 08/2001 s/p L CEA;  B.   09/14/11 - Carotid U/S - 40-59% bilateral stenosis, left CEA patch angioplasty is patent  . DM 06/20/2009  . CAD 06/20/2009    A.  08/2000 - s/p CABG x 4 - LIMA-LAD, Left Radial-OM, VG-DIAG, VG-RCA;  B. Neg. MV  2010  . HYPERLIPIDEMIA 06/20/2009  . HYPERTENSION 06/20/2009  . Hypothyroidism   . Low back pain   . Asthma     as child  . Pneumonia   . Persistent atrial fibrillation     Not felt to be coumadin candidate 2/2 ETOH use.  Marland Kitchen History of tobacco abuse     remote - quit 1970  . Morbidly obese   . Chronic diastolic heart failure, NYHA class 3     Followed by [facility] --> Echo 04/2014: EF 85-27%, Diastolic DysFxn - elevated LVEDP & LAP. Mod Dil LA & RA.  . Falls frequently   . Hx of cardiovascular stress test 05/2014    Lexiscan Myoview (8/15):  No ischemia; EF 63% - Normal Study  . Breast cancer, right breast   . Gout   . OBSTRUCTIVE SLEEP APNEA 06/20/2009    does not use cpap  . Stroke     found on MRI in Oct. 2015.   Marland Kitchen Shortness of breath dyspnea     occasional  . Depression     takes Prozac  . GERD (gastroesophageal reflux disease)     takes Prilosec  . Arthritis   . Neuromuscular disorder     neuropathy in both legs  . Constipation   . ETOH abuse     no alcohol since Oct. 2015  .  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 CHF (congestive heart failure)   . Supplemental oxygen dependent     hs  . Bilateral renal cysts   . Kidney disease     Stage 3   Past Surgical History:  Past Surgical History  Procedure Laterality Date  . Carotid endarterectomy  2002    left  . Coronary artery bypass graft      x 4 - 2001  . Umbilical hernia repair N/A 01/28/2014    Procedure: HERNIA REPAIR UMBILICAL ADULT/INC;  Surgeon: Harl Bowie, MD;  Location: [place];  Service: General;  Laterality: N/A;  . Laparotomy N/A 01/28/2014    Procedure: EXPLORATORY  LAPAROTOMY;  Surgeon: Harl Bowie, MD;  Location: [place];  Service: General;  Laterality: N/A;  . Bowel resection N/A 01/28/2014    Procedure: SMALL BOWEL RESECTION;  Surgeon: Harl Bowie, MD;  Location: [place];  Service: General;  Laterality: N/A;  . Hernia repair    . Colonoscopy    . Total mastectomy Right 11/11/2014    Procedure: RIGHT TOTAL MASTECTOMY;  Surgeon: Excell Seltzer, MD;  Location: [place];  Service: General;  Laterality: Right;  . Cataract extraction w/phaco Left 04/08/2015    Procedure: CATARACT EXTRACTION PHACO AND INTRAOCULAR LENS PLACEMENT (Agency);  Surgeon: Birder Robson, MD;  Location: [facility];  Service: Ophthalmology;  Laterality: Left;  [place] 00:36 AP% 23.7 CDE 8.72 fluid pack ZOX#[id number]  . Eye surgery Left     Catarct  . Laparotomy N/A 06/02/2015    Procedure: EXPLORATORY LAPAROTOMY;  Surgeon: Coralie Keens, MD;  Location: [place];  Service: General;  Laterality: N/A;  . Bowel resection N/A 06/02/2015    Procedure: SMALL BOWEL RESECTION;  Surgeon: Coralie Keens, MD;  Location: [place];  Service: General;  Laterality: N/A;    Assessment & Plan Clinical Impression: Patient is A 69 y.o. male with history of COPD, CAD, PAF- no coumadin due to ETOH use, cerebellar CVA, incarcerated hernia with SB resection [id number]/9811 complicated by [facility] fistula which has not resolved after conservative management.  He was admitted on 06/02/15 for Exp Lap with small bowel resection by Dr. Ninfa Linden.  He has been advanced to soft diet and is having BM. He continues to have confusion post op. Wound healing and plans for tansition to [facility] in the near future.  Patient transferred to CIR on 06/09/2015 .   Patient currently requires max with mobility secondary to muscle weakness, decreased coordination and decreased standing balance, decreased postural control and decreased balance strategies.  Prior to hospitalization, patient was min with mobility and lived with Daughter in a House home.  Home  access is 3Stairs to enter.  Patient will benefit from skilled PT intervention to maximize safe functional mobility, minimize fall risk and decrease caregiver burden for planned discharge home with 24 hour assist.  Anticipate patient will benefit from follow up [facility] at discharge.  PT - End of Session Activity Tolerance: Tolerates 30+ min activity with multiple rests Endurance Deficit: Yes PT Assessment Rehab Potential (ACUTE/IP ONLY): Fair Barriers to Discharge: Other (comment) (motivation) PT Patient demonstrates impairments in the following area(s): Balance;Endurance;Motor;Safety PT Transfers Functional Problem(s): Bed Mobility;Bed to Chair;Car PT Locomotion Functional Problem(s): Ambulation;Wheelchair Mobility;Stairs PT Plan PT Intensity: Minimum of 1-2 x/day ,45 to 90 minutes PT Frequency: 5 out of 7 days PT Duration Estimated Length of Stay: 7-9 PT Treatment/Interventions: Ambulation/gait training;Balance/vestibular training;Discharge planning;DME/adaptive equipment instruction;Functional mobility  training;Patient/family education;Neuromuscular re-education;Therapeutic Exercise;Therapeutic Activities;Stair training;UE/LE Strength taining/ROM;UE/LE Coordination activities;Wheelchair propulsion/positioning PT Transfers Anticipated Outcome(s): min A PT Locomotion Anticipated Outcome(s): min A for gait, supervision for w/c PT Recommendation Follow Up Recommendations: Home health PT;24 hour supervision/assistance Patient destination: Home Equipment Recommended: To be determined  Skilled Therapeutic Intervention Pt seen for initial PT evaluation. Pt initially complained of his stomach being "torn up", RN in to give medicine at start of session. Pt reports no pain in abdomen except with movement.  PT instructed patient in bed mobility and transfers with assist noted below. Pt requires verbal cues for sequencing, foot placement, forward weight shift, and hip extension for sit<>stand.  PT  instructed patient in ambulation with RW x56' with steady A for balance.  PT instructed patient in correct w/c positioning with upright posture and LEs supported on leg rests.  Pt propelled w/c x60' back to room with supervision.  Pt positioned in w/c at end of session with call bell in reach and needs met.    PT Evaluation Precautions/Restrictions Precautions Precautions: Fall Required Braces or Orthoses: Other Brace/Splint Other Brace/Splint: abdominal binder Restrictions Weight Bearing Restrictions: No General Chart Reviewed: Yes Response to Previous Treatment: Patient with no complaints from previous session. Vital SignsTherapy Vitals SpO2: 94-100% on room air during functional activity  Pain Pain Assessment Pain Assessment: 0-10 Pain Score: 5  Pain Location: Ankle Pain Orientation: Right Pain Intervention(s): Emotional support Home Living/Prior Functioning Home Living Available Help at Discharge: Family Type of Home: House Home Access: Stairs to enter Technical brewer of Steps: 3 Entrance Stairs-Rails: Right;Left;Can reach both Home Layout: One level Bathroom Shower/Tub: Walk-in shower;Door [employer] Toilet: Handicapped height Bathroom Accessibility: Yes  Lives With: Daughter Prior Function Level of Independence: Needs assistance with ADLs Toileting: Moderate Dressing: Moderate  Able to Take Stairs?: Yes Driving: No Vocation: Retired Leisure: Hobbies-no Comments: using RW, able to amb limited community distances  Cognition Overall Cognitive Status: Within Functional Limits for tasks assessed Arousal/Alertness: Awake/alert Orientation Level: Oriented X4 (verbal cues for CIR as opposed to [facility]) Sensation Sensation Light Touch: Impaired Detail Light Touch Impaired Details: Impaired RLE;Impaired LLE;Absent LLE;Absent RLE (no sensation below knee, intact to deep pressure at knee, intact to light touch above knee) Stereognosis: Impaired by gross  assessment Hot/Cold: Appears Intact Proprioception: Impaired by gross assessment (BLE) Coordination Gross Motor Movements are Fluid and Coordinated: No Fine Motor Movements are Fluid and Coordinated: No (finger opposition impaired L>R) Coordination and Movement Description: decreased smoothness of movement in UEs Finger Nose Finger Test: decreased accurary and speed, worse on L Motor  Motor Motor - Skilled Clinical Observations: generalized weakness   Locomotion  Ambulation Ambulation: Yes Ambulation/Gait Assistance: 4: Min guard Ambulation Distance (Feet): 56 Feet Assistive device: Rolling walker Gait Gait: Yes Gait Pattern: Impaired Gait Pattern: Step-to pattern;Decreased stride length;Narrow base of support;Trunk flexed Gait velocity: decreased Stairs / Additional Locomotion Stairs: No Architect: Yes Wheelchair Assistance: 5: Investment banker, operational Details: Verbal cues for technique;Verbal cues for Information systems manager: Both upper extremities Wheelchair Parts Management: Needs assistance Distance: 60  Trunk/Postural Assessment  Cervical Assessment Cervical Assessment: Exceptions to [facility] (forward head) Thoracic Assessment Thoracic Assessment: Exceptions to [facility] (kyphotic) Lumbar Assessment Lumbar Assessment: Exceptions to [facility] (posterior pelvic tilt)  Balance Balance Balance Assessed: Yes Dynamic Sitting Balance Sitting balance - Comments: sitting without UE support and supervision x5 minutes Static Standing Balance Static Standing - Level of Assistance: 4: Min assist Dynamic Standing Balance Dynamic Standing - Level of Assistance: 3:  Mod assist Extremity Assessment  RUE Assessment RUE Assessment: Within Functional Limits (for basic ADLs) LUE Assessment LUE Assessment: Within Functional Limits (for basic ADLs) RLE Assessment RLE Assessment: Exceptions to [facility] RLE Strength RLE Overall Strength Comments:  4/5 hip flexion, 3-/5 knee flexion, 4/5 knee extension LLE Assessment LLE Assessment: Exceptions to [facility] LLE Strength LLE Overall Strength Comments: 4/5 hip flexion, 3-/5 knee flexion, 4/5 knee extension  Function:  Bed Mobility Roll left and right activity   Assist level: Max assist (Pt 25 - 49%) Roll left and right assistive device: HOB elevated;Bedrails  Sit to lying activity Sit to lying acitivty did not occur:  (pt positioned in w/c at end of session)      Lying to sitting activity   Assist level: Maximal assist (Pt 25 - 49%) Lying to sitting assistive device: HOB elevated;Bedrails  Mobility details Bed mobility details: Manual facilitation for weight shifting;Manual facilitation for placement;Verbal cues for techniques;Verbal cues for sequencing   Transfers Sit to stand transfer   Sit to stand assist level: Maximal assist (Pt 25 - 49%/lift and lower) Sit to stand assistive device: Walker;Armrests  Chair/bed transfer   Chair/bed transfer method: Stand pivot Chair/bed transfer assist level: Maximal assist (Pt 25 - 49%/lift and lower) (to w/c) Chair/bed transfer assistive device: Bedrails;Walker   Chair/bed transfer details: Manual facilitation for weight shifting;Verbal cues for safe use of DME/AE;Verbal cues for sequencing   Toilet transfer   Toilet transfer assistive device: Grab bar;Elevated toilet seat/BSC over toilet;Walker   Assist level to toilet: Maximal assist (Pt 25 - 49%/lift and lower) Assist level from toilet: Maximal assist (Pt 25 - 49%/lift and lower)      Car transfer          Locomotion Ambulation   Assistive device: Walker-rolling Max distance: 56 Assist level: Touching or steadying assistance (Pt > 75%)  Walk 10 feet activity   Assist level: Touching or steadying assistance (Pt > 75%)  Walk 50 feet with 2 turns activity   Assist level: Touching or steadying assistance (Pt > 75%)  Walk 150 feet activity      Walk 10 feet on uneven surfaces  activity      Stairs          Walk up/down 1 step activity        Walk up/down 4 steps activity      Walk up/down 12 steps activity      Pick up small objects from floor      Wheelchair   Type: Manual Max wheelchair distance: 60 Assist Level: Supervision or verbal cues  Wheel 50 feet with 2 turns activity   Assist Level: Supervision or verbal cues  Wheel 150 feet activity       Cognition Comprehension Comprehension assist level: Understands basic 25 - 49% of the time/ requires cueing 50 - 75% of the time  Expression Expression assist level: Expresses basic 50 - 74% of the time/requires cueing 25 - 49% of the time. Needs to repeat parts of sentences.  Social Interaction Social Interaction assist level: Interacts appropriately 75 - 89% of the time - Needs redirection for appropriate language or to initiate interaction.  Problem Solving Problem solving assist level: Solves basic 25 - 49% of the time - needs direction more than half the time to initiate, plan or complete simple activities  Memory Memory assist level: Recognizes or recalls less than 25% of the time/requires cueing greater than 75% of the time    Refer to Care  Plan for Long Term Goals  Recommendations for other services: None  Discharge Criteria: Patient will be discharged from PT if patient refuses treatment 3 consecutive times without medical reason, if treatment goals not met, if there is a change in medical status, if patient makes no progress towards goals or if patient is discharged from hospital.  The above assessment, treatment plan, treatment alternatives and goals were discussed and mutually agreed upon: by patient  Urban Gibson E Penven-Crew 06/10/2015, 2:19 PM

## 2015-06-10 NOTE — Evaluation (Signed)
Occupational Therapy Assessment and Plan  Patient Details  Name: [name] MRN: [id number] Date of Birth: 09/21/1946  OT Diagnosis: cognitive deficits, muscular wasting and disuse atrophy and muscle weakness (generalized) Rehab Potential: Rehab Potential (ACUTE ONLY): Good ELOS: 7-9 days   Today's Date: 06/10/2015 OT Individual Time: 1000-1117 OT Individual Time Calculation (min): 77 min     Problem List:  Patient Active Problem List   Diagnosis Date Noted  . Encephalopathy, metabolic   . Hypomagnesemia 05/27/2015  . History of male breast cancer, s/p R mastectomy Jan 2016 05/27/2015  . General weakness 05/27/2015  . Generalized weakness 05/26/2015  . Hypokalemia 10/03/2014  . Congestive heart failure 10/02/2014  . Lower extremity pain, left 10/02/2014  . Diabetes mellitus type 2, controlled 10/02/2014  . Enterocutaneous fistula   . Hypertension 08/30/2014  . COPD (chronic obstructive pulmonary disease) 08/30/2014  . Intracerebral hemorrhage 08/01/2014  . Osteoarthritis of both hands 07/30/2014  . Thrush, oral 07/28/2014  . Debility 07/25/2014  . Diabetic polyneuropathy associated with type 2 diabetes mellitus 07/25/2014  . Gout flare 07/24/2014  . Pre-operative cardiovascular examination 07/15/2014  . Family history of malignant neoplasm of gastrointestinal tract 06/12/2014  . Panniculitis 04/14/2013  . Seasonal allergies 12/20/2012  . L1 vertebral fracture 09/07/2012  . Hypothyroidism 08/30/2012  . ETOH abuse   . PAF (paroxysmal atrial fibrillation)   . Chronic diastolic heart failure   . DM (diabetes mellitus) type II uncontrolled, periph vascular disorder 06/20/2009  . Obstructive sleep apnea 06/20/2009  . CAROTID STENOSIS 06/20/2009  . CHRONIC OBSTRUCTIVE PULMONARY DISEASE 06/20/2009  . Coronary atherosclerosis of native coronary artery 06/20/2009  . Hypderlipidemia   . Hypertensive heart disease     Past Medical History:  Past Medical History   Diagnosis Date  . CHRONIC OBSTRUCTIVE PULMONARY DISEASE 06/20/2009  . CAROTID STENOSIS 06/20/2009    A. 08/2001 s/p L CEA;  B.   09/14/11 - Carotid U/S - 40-59% bilateral stenosis, left CEA patch angioplasty is patent  . DM 06/20/2009  . CAD 06/20/2009    A.  08/2000 - s/p CABG x 4 - LIMA-LAD, Left Radial-OM, VG-DIAG, VG-RCA;  B. Neg. MV  2010  . HYPERLIPIDEMIA 06/20/2009  . HYPERTENSION 06/20/2009  . Hypothyroidism   . Low back pain   . Asthma     as child  . Pneumonia   . Persistent atrial fibrillation     Not felt to be coumadin candidate 2/2 ETOH use.  Marland Kitchen History of tobacco abuse     remote - quit 1970  . Morbidly obese   . Chronic diastolic heart failure, NYHA class 3     Followed by [facility] --> Echo 04/2014: EF 87-86%, Diastolic DysFxn - elevated LVEDP & LAP. Mod Dil LA & RA.  . Falls frequently   . Hx of cardiovascular stress test 05/2014    Lexiscan Myoview (8/15):  No ischemia; EF 63% - Normal Study  . Breast cancer, right breast   . Gout   . OBSTRUCTIVE SLEEP APNEA 06/20/2009    does not use cpap  . Stroke     found on MRI in Oct. 2015.   Marland Kitchen Shortness of breath dyspnea     occasional  . Depression     takes Prozac  . GERD (gastroesophageal reflux disease)     takes Prilosec  . Arthritis   . Neuromuscular disorder     neuropathy in both legs  . Constipation   . ETOH abuse  no alcohol since Oct. 2015  . 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 CHF (congestive heart failure)   . Supplemental oxygen dependent     hs  . Bilateral renal cysts   . Kidney disease     Stage 3   Past Surgical History:  Past Surgical History  Procedure Laterality Date  . Carotid endarterectomy  2002    left  . Coronary artery bypass graft      x 4 - 2001  . Umbilical hernia repair N/A 01/28/2014    Procedure: HERNIA REPAIR UMBILICAL ADULT/INC;  Surgeon: Harl Bowie, MD;  Location: [place];  Service: General;  Laterality: N/A;   . Laparotomy N/A 01/28/2014    Procedure: EXPLORATORY LAPAROTOMY;  Surgeon: Harl Bowie, MD;  Location: [place];  Service: General;  Laterality: N/A;  . Bowel resection N/A 01/28/2014    Procedure: SMALL BOWEL RESECTION;  Surgeon: Harl Bowie, MD;  Location: [place];  Service: General;  Laterality: N/A;  . Hernia repair    . Colonoscopy    . Total mastectomy Right 11/11/2014    Procedure: RIGHT TOTAL MASTECTOMY;  Surgeon: Excell Seltzer, MD;  Location: [place];  Service: General;  Laterality: Right;  . Cataract extraction w/phaco Left 04/08/2015    Procedure: CATARACT EXTRACTION PHACO AND INTRAOCULAR LENS PLACEMENT ([place]);  Surgeon: Birder Robson, MD;  Location: [facility];  Service: Ophthalmology;  Laterality: Left;  [place] 00:36 AP% 23.7 CDE 8.72 fluid pack LOV#[id number] H  . Eye surgery Left     Catarct  . Laparotomy N/A 06/02/2015    Procedure: EXPLORATORY LAPAROTOMY;  Surgeon: Coralie Keens, MD;  Location: [place];  Service: General;  Laterality: N/A;  . Bowel resection N/A 06/02/2015    Procedure: SMALL BOWEL RESECTION;  Surgeon: Coralie Keens, MD;  Location: [place];  Service: General;  Laterality: N/A;    Assessment & Plan Clinical Impression:  [name] is a 69 y.o. male with history of COPD, CAD, PAF- no coumadin due to ETOH use, cerebellar CVA, incarcerated hernia with SB resection 02/1883 complicated by [facility] fistula which has not resolved after conservative management. he was admitted on 06/02/15 for Exp Lap with small bowel resection by Dr. Ninfa Linden.  He has been advanced to soft diet and is having BM. He continues to have confusion post op. wound healing and plans for tansition to [facility] in the near future.  Therapy ongoing and CIR recommended due to deconditioned state.  Patient transferred to CIR on 06/09/2015 .    Patient currently requires max with basic self-care skills secondary to muscle weakness, decreased oxygen support and decreased standing balance and decreased  balance strategies.  Prior to hospitalization, patient could complete LB self care with min to mod A.  Patient will benefit from skilled intervention to increase independence with basic self-care skills prior to discharge home with care partner.  Anticipate patient will require minimal physical assistance and follow up home health.  OT - End of Session Activity Tolerance: Tolerates < 10 min activity with changes in vital signs (decreased O2 sats on room air with activity) OT Assessment Rehab Potential (ACUTE ONLY): Good OT Patient demonstrates impairments in the following area(s): Balance;Cognition;Endurance;Edema;Motor;Sensory OT Basic ADL's Functional Problem(s): Bathing;Dressing;Toileting;Grooming OT Transfers Functional Problem(s): Toilet;Tub/Shower OT Additional Impairment(s): None OT Plan OT Intensity: Minimum of 1-2 x/day, 45 to 90 minutes OT Frequency: 5 out of 7 days OT Duration/Estimated Length of Stay: 7-9 days  OT Treatment/Interventions: Publishing copy;Functional mobility training;Cognitive remediation/compensation;Self Care/advanced ADL retraining;Therapeutic Activities;Therapeutic Exercise;UE/LE Strength taining/ROM;Patient/family education OT Self Feeding Anticipated Outcome(s): I OT Basic Self-Care Anticipated Outcome(s): min A LB self care OT Toileting Anticipated Outcome(s): min A with clothing management; total A hygiene OT Bathroom Transfers Anticipated Outcome(s): supervision with RW to toilet OT Recommendation Patient destination: Home Follow Up Recommendations: Home health OT Equipment Recommended: None recommended by OT   Skilled Therapeutic Intervention Pt seen for initial evaluation, review of OT POC and goals, and ADL retraining. Pt declined a full bath, he did not have any clothing with him. Pt needed to toilet. Bed mobility and sit to stand from bed min A with min A to walk to toilet without O2  on.  Pt had BM but could not cleanse himself, stating his dtr did that for him. Pt felt "swimmy headed". Blood pressure 131/58 and O2 88% on room air. 1 L O2 reapplied. Pt ambulated back to bed to fix abdominal bandage and don clean gown. Worked on sit to stand and standing tolerance. Pt stated that his stomach was "tore up" and stated he could not do any more therapy.  Pt transferred to recliner to elevate feet. Call light and phone in reach.  OT Evaluation Precautions/Restrictions  Precautions Precautions: Fall Restrictions Weight Bearing Restrictions: No    Vital Signs Therapy Vitals Pulse Rate: 86 BP: (!) 131/58 mmHg Patient Position (if appropriate): Sitting Oxygen Therapy SpO2: 98 % O2 Device: Nasal Cannula O2 Flow Rate (L/min): 1 L/min Pulse Oximetry Type: Intermittent Pain Pain Assessment Pain Assessment: No/denies pain Home Living/Prior Functioning Home Living Family/patient expects to be discharged to:: Private residence Living Arrangements: Children Available Help at Discharge: Family Type of Home: House Home Access: Stairs to enter (through garage) Technical brewer of Steps: 3 Entrance Stairs-Rails: Right, Left Home Layout: One level Bathroom Shower/Tub: Gaffer, Door [employer] Toilet: Handicapped height Bathroom Accessibility: Yes  Lives With: Daughter IADL History Education: ?6th grade or when he was 69 years old  Prior Function Level of Independence: Needs assistance with ADLs Toileting: Moderate Dressing: Moderate  Able to Take Stairs?: Yes Driving: No Vocation: Retired Leisure: Hobbies-no Comments: using rolling walker Vision/Perception  Vision- History Baseline Vision/History: Cataracts Patient Visual Report: Blurring of vision (L eye cataract) Vision- Assessment Vision Assessment?: No apparent visual deficits  Cognition Overall Cognitive Status: No family/caregiver present to determine baseline cognitive  functioning Arousal/Alertness: Awake/alert Orientation Level: Person;Place;Situation Person: Oriented Place: Oriented Zac[facility] Pontes) Situation: Oriented Year: 2016 Month: August Day of Week: Correct Memory: Impaired Memory Impairment: Storage deficit;Retrieval deficit;Decreased recall of new information;Decreased long term memory;Decreased short term memory Decreased Long Term Memory: Verbal basic Decreased Short Term Memory: Verbal basic;Functional basic Immediate Memory Recall: Sock;Blue;Bed Memory Recall: Sock;Blue;Bed Memory Recall Sock: With Cue Memory Recall Blue: Without Cue Memory Recall Bed: Without Cue Attention: Sustained Sustained Attention: Impaired Sustained Attention Impairment: Verbal basic;Functional basic Awareness: Impaired Awareness Impairment: Intellectual impairment Problem Solving: Impaired Problem Solving Impairment: Verbal basic;Functional basic Executive Function:  (all impaired due to lwer level deficits) Behaviors: Perseveration (verbal perseveration on incorrect word) Safety/Judgment: Impaired Sensation Sensation Light Touch: Impaired by gross assessment (B fingers and LEs due to neuropathy) Stereognosis: Impaired by gross assessment Hot/Cold: Appears Intact Proprioception: Impaired by gross assessment (BLE) Coordination Gross Motor Movements are Fluid and Coordinated: No Fine Motor Movements are Fluid and Coordinated: No Coordination and Movement Description: decreased smoothness of movement in UEs Finger Nose Finger Test: 10x in 10 sec BUE but with slightlly  decreased accuracy Motor  Motor Motor - Skilled Clinical Observations: generalized weakness Mobility     Trunk/Postural Assessment  Thoracic Assessment Thoracic Assessment:  (kyphotic posture) Lumbar Assessment Lumbar Assessment:  (Posterior pelvic tilt)  Balance Static Standing Balance Static Standing - Level of Assistance: 4: Min assist Dynamic Standing Balance Dynamic Standing -  Level of Assistance: 2: Max assist Extremity/Trunk Assessment RUE Assessment RUE Assessment: Within Functional Limits (for basic ADLs) LUE Assessment LUE Assessment: Within Functional Limits (for basic ADLs)  Function:   Eating Eating               Grooming Oral Care,Brush Teeth, Clean Dentures Activity:    Oral care, brush teeth, clean dentures activity did not occur: Refused        Wash, Rinse, Dry Face Activity   Assist Level: No help, No cues      Wash, Rinse, Dry Hands Activity   Assist Level: Supervision or verbal cues      Brush, Comb Hair Activity Brush,comb hair activity did not occur: N/A      Shave Activity          Apply Makeup Activity Apply makeup activity did not occur: N/A                                                           Bathing Bathing position   Position: Other (comment) (sitting on toilet)  Bathing parts Body parts bathed by patient: Right arm;Left arm;Chest    Bathing assist Assist Level: Touching or steadying assistance(Pt > 75%)       Upper Body Dressing/Undressing Upper body dressing   What is the patient wearing?: Hospital gown                Upper body assist         Lower Body Dressing/Undressing Lower body dressing   What is the patient wearing?: Hospital Gown;Non-skid slipper socks           Non-skid slipper socks- Performed by helper: Don/doff right sock;Don/doff left sock                  Lower body assist         Toileting Toileting     Toileting steps completed by helper: Performs perineal hygiene (no clothing on) Toileting Assistive Devices: Grab bar or rail  Toileting assist     Bed Mobility Roll left and right activity        Sit to lying activity        Lying to sitting activity     Lying to sitting assistive device: Bedrails  Mobility details Bed mobility details: Tactile cues for weight shifting;Verbal cues for techniques    Transfers Sit to stand transfer   Sit  to stand assist level: Moderate assist (Pt 50 - 74%/lift 2 legs/lift or lower) Sit to stand assistive device: Walker  Chair/bed transfer   Chair/bed transfer method: Ambulatory Chair/bed transfer assist level: Maximal assist (Pt 25 - 49%/lift and lower) (to recliner) Chair/bed transfer assistive device: Walker   Chair/bed transfer details: Manual facilitation for weight shifting  Toilet transfer   Toilet transfer assistive device: Grab bar;Elevated toilet seat/BSC over toilet;Walker       Assist level to toilet: Maximal assist (Pt 25 - 49%/lift and lower) Assist level  from toilet: Maximal assist (Pt 25 - 49%/lift and lower)  Tub/shower transfer Tub/shower transfer activity did not occur: Safety/medical concerns           Cognition Comprehension Comprehension assist level: Understands basic 25 - 49% of the time/ requires cueing 50 - 75% of the time  Expression Expression assist level: Expresses basic 50 - 74% of the time/requires cueing 25 - 49% of the time. Needs to repeat parts of sentences.  Social Interaction Social Interaction assist level: Interacts appropriately 75 - 89% of the time - Needs redirection for appropriate language or to initiate interaction.  Problem Solving Problem solving assist level: Solves basic 25 - 49% of the time - needs direction more than half the time to initiate, plan or complete simple activities  Memory Memory assist level: Recognizes or recalls less than 25% of the time/requires cueing greater than 75% of the time    Refer to Care Plan for Long Term Goals  Recommendations for other services: None  Discharge Criteria: Patient will be discharged from OT if patient refuses treatment 3 consecutive times without medical reason, if treatment goals not met, if there is a change in medical status, if patient makes no progress towards goals or if patient is discharged from hospital.  The above assessment, treatment plan, treatment alternatives and goals  were discussed and mutually agreed upon: by patient  [facility] 06/10/2015, 12:26 PM

## 2015-06-11 ENCOUNTER — Inpatient Hospital Stay (HOSPITAL_COMMUNITY): Payer: Medicare Other | Admitting: Occupational Therapy

## 2015-06-11 ENCOUNTER — Inpatient Hospital Stay (HOSPITAL_COMMUNITY): Payer: Medicare Other | Admitting: *Deleted

## 2015-06-11 ENCOUNTER — Inpatient Hospital Stay (HOSPITAL_COMMUNITY): Payer: Medicare Other | Admitting: Physical Therapy

## 2015-06-11 ENCOUNTER — Inpatient Hospital Stay (HOSPITAL_COMMUNITY): Payer: Medicare Other | Admitting: Speech Pathology

## 2015-06-11 LAB — URINALYSIS, ROUTINE W REFLEX MICROSCOPIC
BILIRUBIN URINE: NEGATIVE
Glucose, UA: NEGATIVE mg/dL
HGB URINE DIPSTICK: NEGATIVE
KETONES UR: NEGATIVE mg/dL
Leukocytes, UA: NEGATIVE
NITRITE: NEGATIVE
PH: 6.5 (ref 5.0–8.0)
Protein, ur: 100 mg/dL — AB
Specific Gravity, Urine: 1.01 (ref 1.005–1.030)
Urobilinogen, UA: 1 mg/dL (ref 0.0–1.0)

## 2015-06-11 LAB — CBC
HCT: 35.5 % — ABNORMAL LOW (ref 39.0–52.0)
HEMOGLOBIN: 10.8 g/dL — AB (ref 13.0–17.0)
MCH: 30.4 pg (ref 26.0–34.0)
MCHC: 30.4 g/dL (ref 30.0–36.0)
MCV: 100 fL (ref 78.0–100.0)
PLATELETS: 161 10*3/uL (ref 150–400)
RBC: 3.55 MIL/uL — AB (ref 4.22–5.81)
RDW: 15.7 % — ABNORMAL HIGH (ref 11.5–15.5)
WBC: 7.7 10*3/uL (ref 4.0–10.5)

## 2015-06-11 LAB — GLUCOSE, CAPILLARY
GLUCOSE-CAPILLARY: 144 mg/dL — AB (ref 65–99)
GLUCOSE-CAPILLARY: 155 mg/dL — AB (ref 65–99)
GLUCOSE-CAPILLARY: 188 mg/dL — AB (ref 65–99)
Glucose-Capillary: 149 mg/dL — ABNORMAL HIGH (ref 65–99)

## 2015-06-11 LAB — URINE MICROSCOPIC-ADD ON

## 2015-06-11 LAB — HEMOGLOBIN A1C
HEMOGLOBIN A1C: 8 % — AB (ref 4.8–5.6)
MEAN PLASMA GLUCOSE: 183 mg/dL

## 2015-06-11 MED ORDER — ALBUTEROL SULFATE (2.5 MG/3ML) 0.083% IN NEBU
2.5000 mg | INHALATION_SOLUTION | Freq: Four times a day (QID) | RESPIRATORY_TRACT | Status: DC | PRN
  Administered 2015-06-13 – 2015-06-14 (×2): 2.5 mg via RESPIRATORY_TRACT
  Filled 2015-06-11 (×2): qty 3

## 2015-06-11 MED ORDER — IPRATROPIUM-ALBUTEROL 0.5-2.5 (3) MG/3ML IN SOLN
3.0000 mL | Freq: Two times a day (BID) | RESPIRATORY_TRACT | Status: DC
  Administered 2015-06-12 – 2015-06-18 (×12): 3 mL via RESPIRATORY_TRACT
  Filled 2015-06-11 (×13): qty 3

## 2015-06-11 NOTE — Progress Notes (Signed)
Occupational Therapy Session Note  Patient Details  Name: [name] MRN: [id number] Date of Birth: 1946/01/01  Today's Date: 06/11/2015 OT Individual Time: 1000-1100 OT Individual Time Calculation (min): 60 min    Short Term Goals: No short term goals set  Skilled Therapeutic Interventions/Progress Updates:    Pt seen this session to facilitate ADL retraining with a focus on cognition of attention, initiation and activity tolerance. Pt in bed c/o not feeling well and how he couldn't get up today. Explained to pt that there was no medical reason for him not to participate.  Pt remained on 1L throughout session as his O2 dropped to 88% on room air with activity.  Pt needed max cuing to participate and get out of bed. Pt sat to EOB with min A and stood from EOB min A to RW. He kept saying he would fall over and that he would not be able to walk to bathroom. Pt was able to walk to bathroom, used toilet and bathing completed from sitting with max A.  Pt was not able to stand up after 3x, so second person called in and pt stood with max A x2.  Toilet height raised so it will be easier next time.  Once standing, pt stood with min A. He needs total A for LB cleansing but can pull up his pants. Walked back to bed with min A with RW but sat on bed fast with strong posterior lean. Pt leaning back on bed with max cues to sit up straight. Pt eventually able to achieve. Pt sitting EOB with nurse in room as PT session starting next.  Therapy Documentation Precautions:  Precautions Precautions: Fall Required Braces or Orthoses: Other Brace/Splint Other Brace/Splint: abdominal binder Restrictions Weight Bearing Restrictions: No    Vital Signs: Oxygen Therapy SpO2: 95 % O2 Device: Nasal Cannula O2 Flow Rate (L/min): 3 L/min Pain: Pain Assessment Pain Assessment: Faces Pain Score: 2  Faces Pain Scale: Hurts little more Pain Type: Surgical pain Pain Location: Abdomen Pain Intervention(s):  Emotional support  Function:   Eating Eating               Grooming Oral Care,Brush Teeth, Clean Dentures Activity:    Oral care, brush teeth, clean dentures activity did not occur: Refused        Wash, Rinse, Dry Face Activity   Assist Level: No help, No cues      Wash, Rinse, Dry Hands Activity   Assist Level: Supervision or verbal cues      Brush, Comb Hair Activity   Assist Level: No help, No cues    Shave Activity          Apply Makeup Activity                                                             Bathing Bathing position   Position:  (sitting on toilet)  Bathing parts Body parts bathed by patient: Right arm;Left arm;Chest Body parts bathed by helper: Buttocks;Front perineal area;Right upper leg;Left upper leg;Right lower leg;Left lower leg;Back  Bathing assist Assist Level: Supervision or verbal cues       Upper Body Dressing/Undressing Upper body dressing   What is the patient wearing?: Pull over shirt/dress       Pull over  shirt/dress - Perfomed by helper: Thread/unthread right sleeve;Thread/unthread left sleeve;Put head through opening;Pull shirt over trunk (pt demonstrating confusion with task )        Upper body assist         Lower Body Dressing/Undressing Lower body dressing   What is the patient wearing?: Socks;Shoes;Underwear;Pants Underwear - Performed by patient: Pull underwear up/down Underwear - Performed by helper: Thread/unthread right underwear leg;Thread/unthread left underwear leg Pants- Performed by patient: Pull pants up/down Pants- Performed by helper: Thread/unthread right pants leg;Thread/unthread left pants leg       Socks - Performed by helper: Don/doff right sock;Don/doff left sock   Shoes - Performed by helper: Don/doff right shoe;Don/doff left shoe;Fasten right;Fasten left          Lower body assist         Toileting Toileting   Toileting steps completed by patient: Adjust clothing after  toileting Toileting steps completed by helper: Performs perineal hygiene Toileting Assistive Devices: Grab bar or rail  Toileting assist Assist level: Touching or steadying assistance (Pt.75%)    Bed Mobility Roll left and right activity        Sit to lying activity Sit to lying acitivty did not occur:  (pt positioned in w/c at end of session)      Lying to sitting activity   Assist level: Touching or steadying assistance (Pt > 75%, lift 1 leg) Lying to sitting assistive device: Other (comment) (pt pulls up using therapists hand)  Mobility details      Transfers Sit to stand transfer   Sit to stand assist level: Maximal assist (Pt 25 - 49%/lift and lower) Sit to stand assistive device: Armrests;Walker  Chair/bed transfer   Chair/bed transfer method: Ambulatory Chair/bed transfer assist level: Maximal assist (Pt 25 - 49%/lift and lower) Chair/bed transfer assistive device: Armrests;Walker   Chair/bed transfer details: Manual facilitation for weight shifting;Verbal cues for safe use of DME/AE;Verbal cues for sequencing  Toilet transfer   Toilet transfer assistive device: Elevated toilet seat/BSC over toilet;Walker       Assist level to toilet: Maximal assist (Pt 25 - 49%/lift and lower) Assist level from toilet: 2 helpers  Herbalist transfer activity did not occur: Safety/medical concerns           Cognition Comprehension Comprehension assist level: Understands basic 25 - 49% of the time/ requires cueing 50 - 75% of the time  Expression Expression assist level: Expresses basic 50 - 74% of the time/requires cueing 25 - 49% of the time. Needs to repeat parts of sentences.  Social Interaction Social Interaction assist level: Interacts appropriately 50 - 74% of the time - May be physically or verbally inappropriate.  Problem Solving Problem solving assist level: Solves basic less than 25% of the time - needs direction nearly all the time or does not  effectively solve problems and may need a restraint for safety  Memory Memory assist level: Recognizes or recalls less than 25% of the time/requires cueing greater than 75% of the time    Therapy/Group: Individual Therapy  [place] 06/11/2015, 12:20 PM

## 2015-06-11 NOTE — Progress Notes (Signed)
[facility] PHYSICAL MEDICINE & REHABILITATION     PROGRESS NOTE    Subjective/Complaints: Awake and alert. "i'm ready to get home". Attributed yesterday's lethargy to getting IV morphine.  ROS: Pt denies fever, rash/itching, headache, blurred or double vision, nausea, vomiting, abdominal pain, diarrhea, chest pain, shortness of breath, palpitations, dysuria, dizziness, neck or back pain, bleeding, anxiety, or depression   Objective: Vital Signs: Blood pressure 136/69, pulse 55, temperature 99.1 F (37.3 C), temperature source Oral, resp. rate 18, SpO2 93 %. No results found.  Recent Labs  06/10/15 0555 06/11/15 0558  WBC 6.6 7.7  HGB 9.6* 10.8*  HCT 31.2* 35.5*  PLT 140* 161    Recent Labs  06/10/15 0555  NA 132*  K 3.4*  CL 92*  GLUCOSE 205*  BUN 20  CREATININE 0.81  CALCIUM 8.2*   CBG (last 3)   Recent Labs  06/10/15 1658 06/10/15 2120 06/11/15 0636  GLUCAP 157* 240* 144*    Wt Readings from Last 3 Encounters:  06/05/15 120.3 kg (265 lb 3.4 oz)  05/29/15 106.641 kg (235 lb 1.6 oz)  04/08/15 110.224 kg (243 lb)    Physical Exam:  Constitutional: He is oriented to person, place, and time. He appears well-developed and well-nourished.  HENT:  Head: Normocephalic and atraumatic.  Eyes: Conjunctivae are normal. Pupils are equal, round, and reactive to light.  Neck: Normal range of motion. Neck supple. No tracheal deviation present. No thyromegaly present.  Cardiovascular: Normal rate. An irregular rhythm present.  Respiratory: Effort normal. No respiratory distress. He has decreased breath sounds in the left lower field. He has no wheezes. He has no rales. He exhibits no tenderness.  GI: Soft. Bowel sounds are normal. He exhibits no distension. There is mild tenderness.  Abdominal wound clean with beefy red granulation tissue  Musculoskeletal: He exhibits edema.  Bruises on bilateral knees with dry abrasion left knee. Both knees generally tender to  touch. Also with low back tenderness BLE with vascular changes below the knees to the ankles. 1 pedal/ankle edema.  Neurological: He is alert and oriented to person, place, and time.  Speech clear. More alert.  Able to follow simple one step commands. Recalled being "drunk" yesterday. CN exam without focal findings. Decreased insight and awareness. Oriented to hospital. Attention span 10 seconds or less. UE: 3+ deltoid, bicep, tricep, wrist, 4HI. LE: 2+HF, 3-KE and 4- ADF,APF. Decreased LT in both feet and ankles.  Skin: Skin is warm and dry.  Psychiatric: His speech is normal. He is slowed.  Generally cooperative but still distracted, less confused   Assessment/Plan: 1. Functional deficits secondary to encephalopathy/debility which require 3+ hours per day of interdisciplinary therapy in a comprehensive inpatient rehab setting. Physiatrist is providing close team supervision and 24 hour management of active medical problems listed below. Physiatrist and rehab team continue to assess barriers to discharge/monitor patient progress toward functional and medical goals.  Function:  Bathing Bathing position   Position: Other (comment) (sitting on toilet)  Bathing parts Body parts bathed by patient: Right arm, Left arm, Chest    Bathing assist Assist Level: Touching or steadying assistance(Pt > 75%)      Upper Body Dressing/Undressing Upper body dressing   What is the patient wearing?: Hospital gown                Upper body assist        Lower Body Dressing/Undressing Lower body dressing   What is the patient wearing?: Hospital Gown, Non-skid  slipper socks           Non-skid slipper socks- Performed by helper: Don/doff right sock, Don/doff left sock                  Lower body assist        Toileting Toileting     Toileting steps completed by helper: Performs perineal hygiene Toileting Assistive Devices: Grab bar or rail, Toilet aid  Toileting assist Assist  level: Touching or steadying assistance (Pt.75%)   Transfers Chair/bed transfer   Chair/bed transfer method: Stand pivot Chair/bed transfer assist level: Maximal assist (Pt 25 - 49%/lift and lower) (to w/c) Chair/bed transfer assistive device: Bedrails, Medical sales representative     Max distance: 56 Assist level: Touching or steadying assistance (Pt > 75%)   Wheelchair   Type: Manual Max wheelchair distance: 60 Assist Level: Supervision or verbal cues  Cognition Comprehension Comprehension assist level: Understands basic 50 - 74% of the time/ requires cueing 25 - 49% of the time  Expression Expression assist level: Expresses basic 50 - 74% of the time/requires cueing 25 - 49% of the time. Needs to repeat parts of sentences.  Social Interaction Social Interaction assist level: Interacts appropriately 50 - 74% of the time - May be physically or verbally inappropriate.  Problem Solving Problem solving assist level: Solves basic less than 25% of the time - needs direction nearly all the time or does not effectively solve problems and may need a restraint for safety  Memory Memory assist level: Recognizes or recalls less than 25% of the time/requires cueing greater than 75% of the time   Medical Problem List and Plan: 1. 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 DVT Prophylaxis/Anticoagulation: Pharmaceutical: Lovenox is indicated due to immobiltiy 3. Chronic back pain/Pain Management: will increase oxycodone to 7.5mg  for pain control.  -limit other neurosedating meds as possible 4. Mood: LCSW to follow up with patient for evaluation and support.  5. Neuropsych: This patient is not yet capable of making decisions on his own behalf although he is showing some improvement 6. Skin/Wound Care: Daily dressing changes. Wound looks great 7. Fluids/Electrolytes/Nutrition: Monitor I/O. I personally  reviewed the patient's labs today. Replace potassium 8. COPD:   Oxygen at nights, prn during the day. Flovent bid with rescue inhaler prn. Encourage OOB 9. A fib: On coreg bid. HR is controlled at present. 10 H/o Gout: Continue allopurinol and colchicine.  11. DM type 2?: will add low dose amaryl for better control  12. H/o depression: On Prozac and Effexor.provide ego support 13. Chronic diastolic CHF: Monitor weights daily as well as I's and O's. Low salt diet. Elevate BLE when seated and ACE wrap for edema control. On Lasix bid.  14. Leucocytosis: Monitor for signs of infection. CBC stable.  15. ABLA: Encourage intake. hgb drop today from 8/24.  MCV high but will check stools, follow up tomorrow to confirm. 16. Encephalopathy: H/o alcohol abuse. Monitor mentation for now.  17. Dizziness:   Orthostatics negative thus far. Encourage fluids, extra time 18. Constipation: had bm    -senna  LOS (Days) 2 A FACE TO FACE EVALUATION WAS PERFORMED  [name] 06/11/2015 7:57 AM

## 2015-06-11 NOTE — Patient Care Conference (Signed)
Inpatient RehabilitationTeam Conference and Plan of Care Update Date: 06/10/2015   Time: 2:45 PM    Patient Name: [name]      Medical Record Number: [id number]  Date of Birth: 1946/07/20 Sex: Male         Room/Bed: 4M05C/4M05C-01 Payor Info: Payor: MEDICARE / Plan: MEDICARE PART A AND B / Product Type: *No Product type* /    Admitting Diagnosis: Debility, encephalopathy   Admit Date/Time:  06/09/2015  5:44 PM Admission Comments: No comment available   Primary Diagnosis:  Debility Principal Problem: Debility  Patient Active Problem List   Diagnosis Date Noted  . Encephalopathy, metabolic   . Hypomagnesemia 05/27/2015  . History of male breast cancer, s/p R mastectomy Jan 2016 05/27/2015  . General weakness 05/27/2015  . Generalized weakness 05/26/2015  . Hypokalemia 10/03/2014  . Congestive heart failure 10/02/2014  . Lower extremity pain, left 10/02/2014  . Diabetes mellitus type 2, controlled 10/02/2014  . Enterocutaneous fistula   . Hypertension 08/30/2014  . COPD (chronic obstructive pulmonary disease) 08/30/2014  . Intracerebral hemorrhage 08/01/2014  . Osteoarthritis of both hands 07/30/2014  . Thrush, oral 07/28/2014  . Debility 07/25/2014  . Diabetic polyneuropathy associated with type 2 diabetes mellitus 07/25/2014  . Gout flare 07/24/2014  . Pre-operative cardiovascular examination 07/15/2014  . Family history of malignant neoplasm of gastrointestinal tract 06/12/2014  . Panniculitis 04/14/2013  . Seasonal allergies 12/20/2012  . L1 vertebral fracture 09/07/2012  . Hypothyroidism 08/30/2012  . ETOH abuse   . PAF (paroxysmal atrial fibrillation)   . Chronic diastolic heart failure   . DM (diabetes mellitus) type II uncontrolled, periph vascular disorder 06/20/2009  . Obstructive sleep apnea 06/20/2009  . CAROTID STENOSIS 06/20/2009  . CHRONIC OBSTRUCTIVE PULMONARY DISEASE 06/20/2009  . Coronary atherosclerosis of native coronary artery 06/20/2009  .  Hypderlipidemia   . Hypertensive heart disease     Expected Discharge Date: Expected Discharge Date: 06/18/15  Team Members Present: Physician leading conference: Dr. Alger Simons Social Worker Present: Lennart Pall, LCSW Nurse Present: Heather Roberts, RN PT Present: Raylene Everts, PT;Caitlin Penven-Crew, PT OT Present: Salome Spotted, OT;Roanna Epley, COTA SLP Present: Weston Anna, SLP PPS Coordinator present : Daiva Nakayama, RN, CRRN     Current Status/Progress Goal Weekly Team Focus  Medical   debility, encephalopathy after incarcerated bowel and ex lap  improve safety and cognition  wound care, pain mgt, cognitive deficits, nutrition   Bowel/Bladder   Pt continent of bowel and bladder with urgancy episodes of both at times.   manage bowel and bladder minimal assist  encourgae use of toleit and if urinal use encourgae pt to hold urinal and do as much as possible   Swallow/Nutrition/ Hydration             ADL's   max to total  A with LB self care, mod - max A transfers, decreased activity tolerance  min A with dynamic balance and toileting for clothing management, supervision toilet transfers, mod A bathing and LB dressing  ADL retraining, balance, activity tolerance, pt/family education   Mobility   mod/max A bed mobility, transfers, steady A gait, supervision w/c  min A all mobility, supervision w/c  strengthening, activity tolerance, standing balance    Communication   Min-Mod A   Min A  use of word-finding strategies   Safety/Cognition/ Behavioral Observations  Max A  Min A  orientation, problem solving, attention, recall, awareness    Pain   5 mg oxy prn q4hrs.  pain to abdomen   4 or less  assess pain and medicate as needed   Skin   wet to dry dressing change to abdomen BID. pink blanchable sacrum EPBC used, abrasion to left knee and BLE. OTA.   free from breakdown min assist. Mod-max assist with dressing chnage  assess skin and educate dressing change     Rehab  Goals Patient on target to meet rehab goals: Yes *See Care Plan and progress notes for long and short-term goals.  Barriers to Discharge: cognition, premorbid deficits    Possible Resolutions to Barriers:  NMR, cognitive perceptual rx, pain mgt, family ed    Discharge Planning/Teaching Needs:  home with family to provide 24/7 assistance      Team Discussion:  New eval;  Cognition, wound care and pain issues.  Hope to avoid wound VAC.  Good first day.  Goals set @ min assist overall (likely the baseline) but min/ mod with OT.    Revisions to Treatment Plan:  None   Continued Need for Acute Rehabilitation Level of Care: The patient requires daily medical management by a physician with specialized training in physical medicine and rehabilitation for the following conditions: Daily direction of a multidisciplinary physical rehabilitation program to ensure safe treatment while eliciting the highest outcome that is of practical value to the patient.: Yes Daily medical management of patient stability for increased activity during participation in an intensive rehabilitation regime.: Yes Daily analysis of laboratory values and/or radiology reports with any subsequent need for medication adjustment of medical intervention for : Neurological problems;Post surgical problems;Other  [name] 06/12/2015, 11:17 AM

## 2015-06-11 NOTE — Progress Notes (Signed)
Physical Therapy Session Note  Patient Details  Name: [name] MRN: [id number] Date of Birth: 23-Jan-1946  Today's Date: 06/11/2015 PT Individual Time: 1100-1200 and 1600-1630 PT Individual Time Calculation (min): 60 min and 30 min   Short Term Goals: Week 1:  PT Short Term Goal 1 (Week 1): Pt will perform bed mobility with mod A PT Short Term Goal 2 (Week 1): Pt will perform transfers with mod A from a variety of surfaces PT Short Term Goal 3 (Week 1): Pt will amb 100' with min A and RW PT Short Term Goal 4 (Week 1): Pt will propel w/c 150' with supervision PT Short Term Goal 5 (Week 1): Pt will negotiate 4 steps with 2 hands rails and mod A  Skilled Therapeutic Interventions/Progress Updates:    Session 1: Pt received supine in bed with LEs over EOB.  PT assisted pt into sitting EOB  And pt performs sit>stand transfer with RW and bed elevated with min A. Pt amb x35' with RW and max verbal cues to stay attended to task and pt sat in w/c with max A, not following verbal cues for knee/hip flexion or descent control.  Pt propelled w/c to therapy gym with BUEs and supervision with verbal cues to stay attended to task, significantly increased time to propel w/c x150'.  PT instructs patient in 3" step negotiation with 2 handrails x8 steps with steady A to ascend/descend and min A to turn at top of steps.  PT transported pt back to room in w/c for time management, pt positioned in w/c with QRB in place, call bell in reach, and needs met.  NT in room to change linens as PT leaving.    Session 2:  Pt received supine in bed and agreeable to therapy.  Pt transitioned to sitting EOB with mod A, using PT's hand to pull into sitting position after maneuvering LEs over EOB.  Pt performed stand pivot transfer from EOB to w/c with RW and mod A to control descent and verbal cues for sequencing and safety.  Pt propels w/c x150' to therapy gym with supervision and verbal cues for obstacle negotiation.  PT  instructed patient in car transfer using stand pivot from w/c with RW and max A.  Pt required 2 attempts to come to standing from w/c with verbal cues for hand placement, foot placement, and forward weight shift.  Pt returned to w/c after car transfer (max A) and returned to room in w/c total A for time management.  Pt positioned in w/c with QRB in place, call bell in reach, and needs met.     Therapy Documentation Precautions:  Precautions Precautions: Fall Required Braces or Orthoses: Other Brace/Splint Other Brace/Splint: abdominal binder Restrictions Weight Bearing Restrictions: No Pain: Pain Assessment Pain Assessment: No/denies pain Faces Pain Scale: Hurts little more Pain Type: Acute pain Pain Location: Leg Pain Orientation: Right;Left Pain Descriptors / Indicators: Aching Pain Intervention(s): Medication (See eMAR);Repositioned  Function:  Bed Mobility Roll left and right activity        Sit to lying activity Sit to lying acitivty did not occur:  (pt positioned in w/c at end of session)      Lying to sitting activity   Assist level: Touching or steadying assistance (Pt > 75%, lift 1 leg) Lying to sitting assistive device: Other (comment) (pt pulls up using therapists hand)  Mobility details     Transfers Sit to stand transfer   Sit to stand assist level: Maximal assist (  Pt 25 - 49%/lift and lower) Sit to stand assistive device: Armrests;Walker  Chair/bed transfer   Chair/bed transfer method: Ambulatory Chair/bed transfer assist level: Maximal assist (Pt 25 - 49%/lift and lower) Chair/bed transfer assistive device: Armrests;Walker   Chair/bed transfer details: Manual facilitation for weight shifting;Verbal cues for safe use of DME/AE;Verbal cues for sequencing   Garment/textile technologist transfer assistive device: Publishing rights manager transfer assist level: Maximal assist (Pt 25 - 49%/lift and lower)    Locomotion Ambulation   Assistive  device: Walker-rolling   Assist level: Touching or steadying assistance (Pt > 75%)  Walk 10 feet activity   Assist level: Touching or steadying assistance (Pt > 75%)  Walk 50 feet with 2 turns activity      Walk 150 feet activity      Walk 10 feet on uneven surfaces activity      Stairs   Stairs assistive device: 2 hand rails Max number of stairs: 8 Stairs assist level: Touching or steadying assistance (Pt > 75%)  Walk up/down 1 step activity     Walk up/down 1 step (curb) assist level: Touching or steadying assistance (Pt > 75%)  Walk up/down 4 steps activity   Walk up/down 4 steps assist level: Touching or steadying assistance (Pt > 75%)  Walk up/down 12 steps activity      Pick up small objects from floor Pick up small object from the floor (from standing position) activity did not occur: Safety/medical concerns    Wheelchair   Type: Manual Max wheelchair distance: 150 Assist Level: Supervision or verbal cues  Wheel 50 feet with 2 turns activity   Assist Level: Supervision or verbal cues  Wheel 150 feet activity   Assist Level: Supervision or verbal cues     Therapy/Group: Individual Therapy  Earnest Conroy Penven-Crew 06/11/2015, 4:42 PM

## 2015-06-11 NOTE — Progress Notes (Signed)
Recreational Therapy Session Note  Patient Details  Name: [name] MRN: [id number] Date of Birth: 11-13-1945 Today's Date: 06/11/2015   Order received and chart reviewed.  Familiar with pt from previous admission.  Pt with limited interest in TR services and was sedentary PTA.  Will continue to monitor.    [place] 06/11/2015, 3:24 PM

## 2015-06-11 NOTE — Progress Notes (Signed)
Speech Language Pathology Daily Session Note  Patient Details  Name: [name] MRN: [id number] Date of Birth: Oct 13, 1945  Today's Date: 06/11/2015 SLP Individual Time: 0830-0900 SLP Individual Time Calculation (min): 30 min  Short Term Goals: Week 1: SLP Short Term Goal 1 (Week 1): Patient will sustain attention to basic functional tasks for 45-60 seconds with Mod verbal cues for redirection  SLP Short Term Goal 2 (Week 1): Patient will recall and verbally identify 2 cognitive and 2 physical deficits with Mod question cues SLP Short Term Goal 3 (Week 1): Patient will demonstrate consistent use (over 3 consecutive sessions) of external aids for re-orientation with Min question cues  SLP Short Term Goal 4 (Week 1): Patient will demonstrate basic problem solving during completion of functional tasks with Mod question cues  SLP Short Term Goal 5 (Week 1): Patient will maintain topic of conversation for 3-4 turns in 70% of opportunities with Mod verbal cues for redirection  SLP Short Term Goal 6 (Week 1): Patient will utilize description and or gestures to express basic information during moments of anomia with Mod vebral cues  Skilled Therapeutic Interventions: Skilled treatment session focused on cognitive goals. Upon arrival, patient was supine in bed and appeared extremely fatigued and lethargic and required Max A multimodal cues to maintain arousal for ~30 seconds. Patient required Max A multimodal cues for orientation to time and situation with language of confusion. Patient was initially on 3L of O2 but was placed on 1L per orders with O2 saturations remaining above 90%. Patient also required Max A to utilize schedule and reported he is unable to read at baseline, family was not present to confirm. Patient left supine in bed with all needs within reach. Continue with current plan of care.    Function:  Cognition Comprehension Comprehension assist level: Understands basic 25 - 49% of the  time/ requires cueing 50 - 75% of the time  Expression   Expression assist level: Expresses basic 50 - 74% of the time/requires cueing 25 - 49% of the time. Needs to repeat parts of sentences.  Social Interaction Social Interaction assist level: Interacts appropriately 25 - 49% of time - Needs frequent redirection.  Problem Solving Problem solving assist level: Solves basic less than 25% of the time - needs direction nearly all the time or does not effectively solve problems and may need a restraint for safety  Memory Memory assist level: Recognizes or recalls less than 25% of the time/requires cueing greater than 75% of the time    Pain Pain Assessment Faces Pain Scale: Hurts little more Pain Type: Acute pain Pain Location: Leg Pain Orientation: Right;Left Pain Descriptors / Indicators: Aching Pain Intervention(s): Medication (See eMAR);Repositioned  Therapy/Group: Individual Therapy  Branson Kranz 06/11/2015, 4:32 PM

## 2015-06-12 ENCOUNTER — Inpatient Hospital Stay (HOSPITAL_BASED_OUTPATIENT_CLINIC_OR_DEPARTMENT_OTHER): Payer: Medicare Other

## 2015-06-12 ENCOUNTER — Inpatient Hospital Stay (HOSPITAL_COMMUNITY): Payer: Medicare Other

## 2015-06-12 ENCOUNTER — Inpatient Hospital Stay (HOSPITAL_COMMUNITY): Payer: Medicare Other | Admitting: Physical Therapy

## 2015-06-12 ENCOUNTER — Inpatient Hospital Stay (HOSPITAL_COMMUNITY): Payer: Medicare Other | Admitting: Occupational Therapy

## 2015-06-12 ENCOUNTER — Inpatient Hospital Stay (HOSPITAL_COMMUNITY): Payer: Medicare Other | Admitting: Speech Pathology

## 2015-06-12 DIAGNOSIS — I5033 Acute on chronic diastolic (congestive) heart failure: Secondary | ICD-10-CM | POA: Insufficient documentation

## 2015-06-12 DIAGNOSIS — I509 Heart failure, unspecified: Secondary | ICD-10-CM | POA: Diagnosis not present

## 2015-06-12 LAB — COMPREHENSIVE METABOLIC PANEL
ALBUMIN: 2.2 g/dL — AB (ref 3.5–5.0)
ALK PHOS: 60 U/L (ref 38–126)
ALT: 12 U/L — AB (ref 17–63)
AST: 17 U/L (ref 15–41)
Anion gap: 5 (ref 5–15)
BUN: 21 mg/dL — AB (ref 6–20)
CALCIUM: 8.6 mg/dL — AB (ref 8.9–10.3)
CHLORIDE: 96 mmol/L — AB (ref 101–111)
CO2: 34 mmol/L — AB (ref 22–32)
CREATININE: 1.02 mg/dL (ref 0.61–1.24)
GFR calc Af Amer: >60 mL/min (ref >60)
GFR calc non Af Amer: >60 mL/min (ref >60)
GLUCOSE: 92 mg/dL (ref 65–99)
Potassium: 4.1 mmol/L (ref 3.5–5.1)
SODIUM: 135 mmol/L (ref 135–145)
Total Bilirubin: 0.4 mg/dL (ref 0.3–1.2)
Total Protein: 5.6 g/dL — ABNORMAL LOW (ref 6.5–8.1)

## 2015-06-12 LAB — GLUCOSE, CAPILLARY
GLUCOSE-CAPILLARY: 174 mg/dL — AB (ref 65–99)
Glucose-Capillary: 148 mg/dL — ABNORMAL HIGH (ref 65–99)
Glucose-Capillary: 78 mg/dL (ref 65–99)
Glucose-Capillary: 157 mg/dL — ABNORMAL HIGH (ref 65–99)

## 2015-06-12 LAB — BRAIN NATRIURETIC PEPTIDE: B NATRIURETIC PEPTIDE 5: 474.3 pg/mL — AB (ref 0.0–100.0)

## 2015-06-12 LAB — CBC
HEMATOCRIT: 32.8 % — AB (ref 39.0–52.0)
HEMOGLOBIN: 10 g/dL — AB (ref 13.0–17.0)
MCH: 30.6 pg (ref 26.0–34.0)
MCHC: 30.5 g/dL (ref 30.0–36.0)
MCV: 100.3 fL — AB (ref 78.0–100.0)
Platelets: 166 10*3/uL (ref 150–400)
RBC: 3.27 MIL/uL — ABNORMAL LOW (ref 4.22–5.81)
RDW: 15.7 % — AB (ref 11.5–15.5)
WBC: 7.4 10*3/uL (ref 4.0–10.5)

## 2015-06-12 LAB — URINE CULTURE

## 2015-06-12 MED ORDER — GLIMEPIRIDE 1 MG PO TABS
1.0000 mg | ORAL_TABLET | Freq: Every day | ORAL | Status: DC
  Administered 2015-06-12 – 2015-06-18 (×7): 1 mg via ORAL
  Filled 2015-06-12 (×7): qty 1

## 2015-06-12 MED ORDER — FUROSEMIDE 10 MG/ML IJ SOLN
80.0000 mg | Freq: Two times a day (BID) | INTRAMUSCULAR | Status: DC
Start: 2015-06-12 — End: 2015-06-16
  Administered 2015-06-12 – 2015-06-15 (×7): 80 mg via INTRAVENOUS
  Filled 2015-06-12 (×8): qty 8

## 2015-06-12 MED ORDER — POTASSIUM CHLORIDE CRYS ER 20 MEQ PO TBCR
20.0000 meq | EXTENDED_RELEASE_TABLET | Freq: Two times a day (BID) | ORAL | Status: DC
  Administered 2015-06-12 – 2015-06-18 (×12): 20 meq via ORAL
  Filled 2015-06-12 (×13): qty 1

## 2015-06-12 NOTE — Progress Notes (Signed)
Social Work Social Work Assessment and Plan  Patient Details  Name: [name] MRN: [id number] Date of Birth: 1945-10-29  Today's Date: 06/12/2015  Problem List:  Patient Active Problem List   Diagnosis Date Noted  . Acute on chronic diastolic CHF (congestive heart failure), NYHA class 3   . Encephalopathy, metabolic   . Hypomagnesemia 05/27/2015  . History of male breast cancer, s/p R mastectomy Jan 2016 05/27/2015  . General weakness 05/27/2015  . Generalized weakness 05/26/2015  . Hypokalemia 10/03/2014  . Congestive heart failure 10/02/2014  . Lower extremity pain, left 10/02/2014  . Diabetes mellitus type 2, controlled 10/02/2014  . Enterocutaneous fistula   . Hypertension 08/30/2014  . COPD (chronic obstructive pulmonary disease) 08/30/2014  . Intracerebral hemorrhage 08/01/2014  . Osteoarthritis of both hands 07/30/2014  . Thrush, oral 07/28/2014  . Debility 07/25/2014  . Diabetic polyneuropathy associated with type 2 diabetes mellitus 07/25/2014  . Gout flare 07/24/2014  . Pre-operative cardiovascular examination 07/15/2014  . Family history of malignant neoplasm of gastrointestinal tract 06/12/2014  . Panniculitis 04/14/2013  . Seasonal allergies 12/20/2012  . L1 vertebral fracture 09/07/2012  . Hypothyroidism 08/30/2012  . ETOH abuse   . PAF (paroxysmal atrial fibrillation)   . Chronic diastolic heart failure   . DM (diabetes mellitus) type II uncontrolled, periph vascular disorder 06/20/2009  . Obstructive sleep apnea 06/20/2009  . CAROTID STENOSIS 06/20/2009  . CHRONIC OBSTRUCTIVE PULMONARY DISEASE 06/20/2009  . Coronary atherosclerosis of native coronary artery 06/20/2009  . Hypderlipidemia   . Hypertensive heart disease    Past Medical History:  Past Medical History  Diagnosis Date  . CHRONIC OBSTRUCTIVE PULMONARY DISEASE 06/20/2009  . CAROTID STENOSIS 06/20/2009    A. 08/2001 s/p L CEA;  B.   09/14/11 - Carotid U/S - 40-59% bilateral stenosis, left  CEA patch angioplasty is patent  . DM 06/20/2009  . CAD 06/20/2009    A.  08/2000 - s/p CABG x 4 - LIMA-LAD, Left Radial-OM, VG-DIAG, VG-RCA;  B. Neg. MV  2010  . HYPERLIPIDEMIA 06/20/2009  . HYPERTENSION 06/20/2009  . Hypothyroidism   . Low back pain   . Asthma     as child  . Pneumonia   . Persistent atrial fibrillation     Not felt to be coumadin candidate 2/2 ETOH use.  Marland Kitchen History of tobacco abuse     remote - quit 1970  . Morbidly obese   . Chronic diastolic heart failure, NYHA class 3     Followed by [facility] Clinic --> Echo 04/2014: EF 78-24%, Diastolic DysFxn - elevated LVEDP & LAP. Mod Dil LA & RA.  . Falls frequently   . Hx of cardiovascular stress test 05/2014    Lexiscan Myoview (8/15):  No ischemia; EF 63% - Normal Study  . Breast cancer, right breast   . Gout   . OBSTRUCTIVE SLEEP APNEA 06/20/2009    does not use cpap  . Stroke     found on MRI in Oct. 2015.   Marland Kitchen Shortness of breath dyspnea     occasional  . Depression     takes Prozac  . GERD (gastroesophageal reflux disease)     takes Prilosec  . Arthritis   . Neuromuscular disorder     neuropathy in both legs  . Constipation   . ETOH abuse     no alcohol since Oct. 2015  . 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  CHF (congestive heart failure)   . Supplemental oxygen dependent     hs  . Bilateral renal cysts   . Kidney disease     Stage 3   Past Surgical History:  Past Surgical History  Procedure Laterality Date  . Carotid endarterectomy  2002    left  . Coronary artery bypass graft      x 4 - 2001  . Umbilical hernia repair N/A 01/28/2014    Procedure: HERNIA REPAIR UMBILICAL ADULT/INC;  Surgeon: Harl Bowie, MD;  Location: [place];  Service: General;  Laterality: N/A;  . Laparotomy N/A 01/28/2014    Procedure: EXPLORATORY LAPAROTOMY;  Surgeon: Harl Bowie, MD;  Location: [place];  Service: General;  Laterality: N/A;  . Bowel resection N/A  01/28/2014    Procedure: SMALL BOWEL RESECTION;  Surgeon: Harl Bowie, MD;  Location: [place];  Service: General;  Laterality: N/A;  . Hernia repair    . Colonoscopy    . Total mastectomy Right 11/11/2014    Procedure: RIGHT TOTAL MASTECTOMY;  Surgeon: Excell Seltzer, MD;  Location: [place];  Service: General;  Laterality: Right;  . Cataract extraction w/phaco Left 04/08/2015    Procedure: CATARACT EXTRACTION PHACO AND INTRAOCULAR LENS PLACEMENT ([place]);  Surgeon: Birder Robson, MD;  Location: [facility];  Service: Ophthalmology;  Laterality: Left;  [place] 00:36 AP% 23.7 CDE 8.72 fluid pack IDP#[id number]  . Eye surgery Left     Catarct  . Laparotomy N/A 06/02/2015    Procedure: EXPLORATORY LAPAROTOMY;  Surgeon: Coralie Keens, MD;  Location: [place];  Service: General;  Laterality: N/A;  . Bowel resection N/A 06/02/2015    Procedure: SMALL BOWEL RESECTION;  Surgeon: Coralie Keens, MD;  Location: [place];  Service: General;  Laterality: N/A;   Social History:  reports that he quit smoking about 46 years ago. He has never used smokeless tobacco. He reports that he drinks alcohol. He reports that he uses illicit drugs (Marijuana).  Family / Support Systems Marital Status: Widow/Widower Patient Roles: Parent Children: daughter, Darrold Junker @ (C) [phone] and her husband Anticipated Caregiver: daughter Ability/Limitations of Caregiver: Dtr does not work and can provide supervision at home. Caregiver Availability: 24/7 Family Dynamics: Pt's daughter and son-in-law are very supportive and have been providing assistance for years.  Social History Preferred language: English Religion: Quaker Cultural Background: NA Read: Yes Write: Yes Employment Status: Retired Freight forwarder Issues: None Guardian/Conservator: None - per MD, pt not capable of making decisions on his own behalf - defer to daughter   Abuse/Neglect Physical Abuse: Denies Verbal Abuse: Denies Sexual Abuse:  Denies Exploitation of patient/patient's resources: Denies Self-Neglect: Denies  Emotional Status Pt's affect, behavior adn adjustment status: Pt much brighter and engaged today (had attempted to interview yesterday but pt extremely lethargic and confused.)  He is able to answer questions without much difficulty.  Did requre some corrections from daughter.  He is smiling and eating lunch.  He denies any s/s of any emotional distress - will monitor. Recent Psychosocial Issues: Per daughter, pt with frequent hospitalizations due to chronic illnesses.  Daughter providing 24/7 care to pt for a long time. Pyschiatric History: None Substance Abuse History: ETOH   Patient / Family Perceptions, Expectations & Goals Pt/Family understanding of illness & functional limitations: Pt and family with good, basic understanding of the surgery that was performed and of his current debilitated state/ need for CIR. Premorbid pt/family roles/activities: Pt very sedentary PTA per pt and daughter.  Only  community activities were MD appointments (which were frequent)  Pt able to ambulate to bathroom and perform ADLs.  Daughter manageming the home and physcial assist as needed. Anticipated changes in roles/activities/participation: little change anticipated as family was providing 24/7 care PTA Pt/family expectations/goals: "I just hope I can do it (rehab)."  [employer]: None Premorbid Home Care/DME Agencies: Other (Comment) [place] [facility]) Transportation available at discharge: yes  Discharge Planning Living Arrangements: Children Support Systems: Children, Other relatives Type of Residence: Private residence Insurance Resources: [employer], Multimedia programmer (specify) ([employer]) Museum/gallery curator Resources: [place] Referred: No Living Expenses: Own Money Management: Family Does the patient have any problems obtaining your medications?: No Home Management: daughter  and son-in-law Patient/Family Preliminary Plans: Pt to return home with daughter and son-in-law to resume their care Social Work Anticipated Follow Up Needs: HH/OP Expected length of stay: 8-12 days  Clinical Impression Frail, elderly gentleman here following surgery and physically deconditioned.  Familiar to CIR (here last year) and has good family support.  Daughter provides 24/7 assist PTA.  Will follow for support and d/c planning needs  Jenice Leiner 06/12/2015, 1:48 PM

## 2015-06-12 NOTE — IPOC Note (Signed)
Overall Plan of Care [facility]) Patient Details Name: [name] MRN: [id number] DOB: 1946-02-11  Admitting Diagnosis: Debility, encephalopathy   Hospital Problems: Principal Problem:   Debility Active Problems:   Chronic diastolic heart failure   PAF (paroxysmal atrial fibrillation)   Diabetic polyneuropathy associated with type 2 diabetes mellitus   COPD (chronic obstructive pulmonary disease)   Encephalopathy, metabolic     Functional Problem List: Nursing Behavior, Bladder, Bowel, Edema, Endurance, Medication Management, Motor, Perception, Safety, Sensory, Skin Integrity  PT Balance, Endurance, Motor, Safety  OT Balance, Cognition, Endurance, Edema, Motor, Sensory  SLP Cognition, Linguistic, Safety  TR         Basic ADL's: OT Bathing, Dressing, Toileting, Grooming     Advanced  ADL's: OT       Transfers: PT Bed Mobility, Bed to Chair, Teacher, early years/pre, Tub/Shower     Locomotion: PT Ambulation, Emergency planning/management officer, Stairs     Additional Impairments: OT None  SLP Communication, Social Cognition expression, comprehension Social Interaction, Problem Solving, Memory, Attention, Awareness  TR      Anticipated Outcomes Item Anticipated Outcome  Self Feeding I  Swallowing      Basic self-care  min A LB self care  Toileting  min A with clothing management; total A hygiene   Bathroom Transfers supervision with RW to toilet  Bowel/Bladder  Pt will remain continent of bowel and bladder with min assist   Transfers  min A  Locomotion  min A for gait, supervision for w/c  Communication  Supervision   Cognition  Min assist   Pain  Pt will rate pain at 4 or less on a scale of 0-10   Safety/Judgment  Pt will remain free of falls and injury with min assist    Therapy Plan: PT Intensity: Minimum of 1-2 x/day ,45 to 90 minutes PT Frequency: 5 out of 7 days PT Duration Estimated Length of Stay: 7-9 OT Intensity: Minimum of 1-2 x/day, 45 to 90 minutes OT  Frequency: 5 out of 7 days OT Duration/Estimated Length of Stay: 7-9 days SLP Intensity: Minumum of 1-2 x/day, 30 to 90 minutes SLP Frequency: 3 to 5 out of 7 days SLP Duration/Estimated Length of Stay: 14-17 days       Team Interventions: Nursing Interventions Pain Management, Patient/Family Education, Bladder Management, Bowel Management, Disease Management/Prevention, Medication Management, Skin Care/Wound Management, Cognitive Remediation/Compensation, Discharge Planning  PT interventions Ambulation/gait training, Balance/vestibular training, Discharge planning, DME/adaptive equipment instruction, Functional mobility training, Patient/family education, Neuromuscular re-education, Therapeutic Exercise, Therapeutic Activities, Stair training, UE/LE Strength taining/ROM, UE/LE Coordination activities, Wheelchair propulsion/positioning  OT Interventions Balance/vestibular training, Discharge planning, DME/adaptive equipment instruction, Functional mobility training, Cognitive remediation/compensation, Self Care/advanced ADL retraining, Therapeutic Activities, Therapeutic Exercise, UE/LE Strength taining/ROM, Patient/family education  SLP Interventions Cognitive remediation/compensation, Cueing hierarchy, Environmental controls, Functional tasks, Internal/external aids, Patient/family education, Speech/Language facilitation, Therapeutic Activities  TR Interventions    SW/CM Interventions Discharge Planning, Psychosocial Support, Patient/Family Education    Team Discharge Planning: Destination: PT-Home ,OT- Home , SLP-Home Projected Follow-up: PT-Home health PT, 24 hour supervision/assistance, OT-  Home health OT, SLP-24 hour supervision/assistance, Home Health SLP Projected Equipment Needs: PT-To be determined, OT- None recommended by OT, SLP-None recommended by SLP Equipment Details: PT- , OT-  Patient/family involved in discharge planning: PT- Patient,  OT-Patient, SLP-Patient  MD ELOS: 10  days Medical Rehab Prognosis:  Good Assessment: The patient has been admitted for CIR therapies with the diagnosis of debility/encephalopathy. The team will be addressing functional mobility, strength,  stamina, balance, safety, adaptive techniques and equipment, self-care, bowel and bladder mgt, patient and caregiver education, pain mgt, volume mgt, nutrition. Goals have been set at min assist for basic mobility, self-care and cognition.    [name], MD, FAAPMR      See Team Conference Notes for weekly updates to the plan of care

## 2015-06-12 NOTE — Progress Notes (Signed)
Physical Therapy Session Note  Patient Details  Name: [name] MRN: [id number] Date of Birth: 10/23/1945  Today's Date: 06/12/2015 PT Individual Time: 1420-1500 PT Individual Time Calculation (min): 40 min   Short Term Goals: Week 1:  PT Short Term Goal 1 (Week 1): Pt will perform bed mobility with mod A PT Short Term Goal 2 (Week 1): Pt will perform transfers with mod A from a variety of surfaces PT Short Term Goal 3 (Week 1): Pt will amb 100' with min A and RW PT Short Term Goal 4 (Week 1): Pt will propel w/c 150' with supervision PT Short Term Goal 5 (Week 1): Pt will negotiate 4 steps with 2 hands rails and mod A  Skilled Therapeutic Interventions/Progress Updates:   Pt received supine in bed having just completed ECHO.  PT instructs patient in supine>sit and sit<>stand transfers with assist described below.  Pt amb to/from bathroom with steady A and performs toileting as documented below.  Pt returns to w/c and propels w/c x150 with increased time due to fatigue and easy distraction.  PT instructed patient in BLE therex x30 reps of heel/toe raises, LAQ, and seated hip flexion.  SLP in at end of session and patient left in her care.    Therapy Documentation Precautions:  Precautions Precautions: Fall Required Braces or Orthoses: Other Brace/Splint Other Brace/Splint: abdominal binder Restrictions Weight Bearing Restrictions: No   General: PT Amount of Missed Time (min): 20 Minutes PT Missed Treatment Reason: Other (Comment) (pt receiving ECHO)  Pain: Pain Assessment Pain Assessment: No/denies pain Pain Score: 3    Function:  Toileting Toileting   Toileting steps completed by patient: Adjust clothing after toileting Toileting steps completed by helper: Adjust clothing prior to toileting;Adjust clothing after toileting Toileting Assistive Devices: Grab bar or rail;Toilet aid Assist level: Touching or steadying assistance (Pt.75%)   Bed Mobility Roll left and  right activity        Sit to lying activity   Assist level: Touching or steadying assistance (Pt > 75%, lift 1 leg)    Lying to sitting activity   Assist level: Touching or steadying assistance (Pt > 75%, lift 1 leg) Lying to sitting assistive device: Other (comment) (pt pulls up on therapist's hand)  Mobility details Bed mobility details: Manual facilitation for weight shifting;Manual facilitation for placement   Transfers Sit to stand transfer   Sit to stand assist level: Maximal assist (Pt 25 - 49%/lift and lower) Sit to stand assistive device: Walker  Chair/bed transfer   Chair/bed transfer method: Ambulatory Chair/bed transfer assist level: Maximal assist (Pt 25 - 49%/lift and lower) Chair/bed transfer assistive device: Armrests;Walker   Chair/bed transfer details: Manual facilitation for weight shifting;Verbal cues for safe use of DME/AE;Verbal cues for sequencing   Toilet transfer   Toilet transfer assistive device: Elevated toilet seat/BSC over toilet;Walker;Grab bar   Assist level to toilet: Maximal assist (Pt 25 - 49%/lift and lower) Assist level from toilet: 2 helpers      Car transfer          Locomotion Ambulation          Walk 10 feet activity      Walk 50 feet with 2 turns activity      Walk 150 feet activity      Walk 10 feet on uneven surfaces activity      Stairs          Walk up/down 1 step activity  Walk up/down 4 steps activity      Walk up/down 12 steps activity      Pick up small objects from floor      Wheelchair   Type: Manual Max wheelchair distance: 150 Assist Level: No help, No cues, assistive device, takes more than reasonable amount of time  Wheel 50 feet with 2 turns activity   Assist Level: No help, No cues, assistive device, takes more than reasonable amount of time  Wheel 150 feet activity   Assist Level: No help, No cues, assistive device, takes more than reasonable amount of time   Cognition Comprehension  Comprehension assist level: Understands basic 50 - 74% of the time/ requires cueing 25 - 49% of the time  Expression Expression assist level: Expresses basic 50 - 74% of the time/requires cueing 25 - 49% of the time. Needs to repeat parts of sentences.  Social Interaction Social Interaction assist level: Interacts appropriately 50 - 74% of the time - May be physically or verbally inappropriate.  Problem Solving Problem solving assist level: Solves basic less than 25% of the time - needs direction nearly all the time or does not effectively solve problems and may need a restraint for safety  Memory Memory assist level: Recognizes or recalls less than 25% of the time/requires cueing greater than 75% of the time    Therapy/Group: Individual Therapy  Earnest Conroy Penven-Crew 06/12/2015, 5:23 PM

## 2015-06-12 NOTE — Progress Notes (Signed)
Social Work Patient ID: [name], male   DOB: Sep 21, 1946, 69 y.o.   MRN: [id number]  Have reviewed team conference with pt, daughter and son-in-law.  All aware and agreeable of targeted d/c date 9/7 with min assist goals.  Will follow for support and d/c planning needs.  Natalija Mavis, LCSW

## 2015-06-12 NOTE — Progress Notes (Addendum)
Speech Language Pathology Daily Session Note  Patient Details  Name: [name] MRN: [id number] Date of Birth: 1946/07/24  Today's Date: 06/12/2015 SLP Individual Time: 1500-1530  SLP Individual Time Calculation (min): 30 min  Short Term Goals: Week 1: SLP Short Term Goal 1 (Week 1): Patient will sustain attention to basic functional tasks for 45-60 seconds with Mod verbal cues for redirection  SLP Short Term Goal 2 (Week 1): Patient will recall and verbally identify 2 cognitive and 2 physical deficits with Mod question cues SLP Short Term Goal 3 (Week 1): Patient will demonstrate consistent use (over 3 consecutive sessions) of external aids for re-orientation with Min question cues  SLP Short Term Goal 4 (Week 1): Patient will demonstrate basic problem solving during completion of functional tasks with Mod question cues  SLP Short Term Goal 5 (Week 1): Patient will maintain topic of conversation for 3-4 turns in 70% of opportunities with Mod verbal cues for redirection  SLP Short Term Goal 6 (Week 1): Patient will utilize description and or gestures to express basic information during moments of anomia with Mod vebral cues  Skilled Therapeutic Interventions: Skilled treatment session focused on cognitive goals. Upon arrival, patient was sitting upright in wheelchair and appeared awake and alert. Patient was independently oriented to situation, time and that he was in the hospital but required cueing for the name of the hospital and the city. Patient participated in a functional conversation in regards to events from previous therapy sessions and required Max A question and verbal cues to maintain the topic of conversation for ~2 turns. Patient was transferred to the recliner at the end of the session to increase comfort and required Max A verbal cues for sequencing and problem solving with task. Patient left with quick release belt in place and all needs within reach. Continue with current plan  of care.    Function:  Cognition Comprehension Comprehension assist level: Understands basic 50 - 74% of the time/ requires cueing 25 - 49% of the time  Expression   Expression assist level: Expresses basic 50 - 74% of the time/requires cueing 25 - 49% of the time. Needs to repeat parts of sentences.  Social Interaction Social Interaction assist level: Interacts appropriately 50 - 74% of the time - May be physically or verbally inappropriate.  Problem Solving Problem solving assist level: Solves basic less than 25% of the time - needs direction nearly all the time or does not effectively solve problems and may need a restraint for safety  Memory Memory assist level: Recognizes or recalls less than 25% of the time/requires cueing greater than 75% of the time    Pain Pain Assessment Pain Assessment: No/denies pain  Therapy/Group: Individual Therapy  Zakhi Dupre 06/12/2015, 3:41 PM

## 2015-06-12 NOTE — Progress Notes (Signed)
  Echocardiogram 2D Echocardiogram has been performed.  [name] 06/12/2015, 2:23 PM

## 2015-06-12 NOTE — Progress Notes (Addendum)
[facility] PHYSICAL MEDICINE & REHABILITATION     PROGRESS NOTE    Subjective/Complaints: Slept pretty well but doesn't feel "right" today---"i don't know if i can do therapy." when questioned he stated he felt tired.   ROS: Pt denies fever, rash/itching, headache, blurred or double vision, nausea, vomiting, abdominal pain, diarrhea, chest pain, shortness of breath, palpitations, dysuria, dizziness, bleeding, anxiety, or depression   Objective: Vital Signs: Blood pressure 122/68, pulse 69, temperature 98.2 F (36.8 C), temperature source Oral, resp. rate 18, height 5\' 10"  (1.778 m), weight 104.5 kg (230 lb 6.1 oz), SpO2 96 %. No results found.  Recent Labs  06/10/15 0555 06/11/15 0558  WBC 6.6 7.7  HGB 9.6* 10.8*  HCT 31.2* 35.5*  PLT 140* 161    Recent Labs  06/10/15 0555  NA 132*  K 3.4*  CL 92*  GLUCOSE 205*  BUN 20  CREATININE 0.81  CALCIUM 8.2*   CBG (last 3)   Recent Labs  06/11/15 1712 06/11/15 2106 06/12/15 0652  GLUCAP 155* 188* 174*    Wt Readings from Last 3 Encounters:  06/12/15 104.5 kg (230 lb 6.1 oz)  06/05/15 120.3 kg (265 lb 3.4 oz)  05/29/15 106.641 kg (235 lb 1.6 oz)    Physical Exam:  Constitutional: He is oriented to person, place, and time. He appears well-developed and well-nourished.  HENT:  Head: Normocephalic and atraumatic.  Eyes: Conjunctivae are normal. Pupils are equal, round, and reactive to light.  Neck: Normal range of motion. Neck supple. No tracheal deviation present. No thyromegaly present.  Cardiovascular: Normal rate. An irregular rhythm present.  Respiratory: Effort normal. No respiratory distress. He has decreased breath sounds in the bases. He has no wheezes. He has no rales. He exhibits no tenderness.  GI: Soft. Bowel sounds are normal. He exhibits no distension. There is mild tenderness.  Abdominal wound continues to be clean with beefy red granulation tissue  Musculoskeletal: He exhibits edema 1+.    Bruises on bilateral knees with dry abrasion left knee. Both knees generally tender to touch. Also with low back tenderness BLE with vascular changes below the knees to the ankles. 1+ pedal/ankle edema.  Neurological: He is alert and oriented to person, place, and time.  Speech clear. More alert.  Able to follow simple one step commands. Recalled being "drunk" yesterday. CN exam without focal findings. poor insight and awareness. Oriented to hospital. Attention span 10 seconds or less. UE: 3+ deltoid, bicep, tricep, wrist, 4HI. LE: 2+HF, 3-KE and 4- ADF,APF. Decreased LT in both feet and ankles.  Skin: Skin is warm and dry.  Psychiatric: His speech is normal. He is slowed.  Generally cooperative but still distracted, less confused   Assessment/Plan: 1. Functional deficits secondary to encephalopathy/debility which require 3+ hours per day of interdisciplinary therapy in a comprehensive inpatient rehab setting. Physiatrist is providing close team supervision and 24 hour management of active medical problems listed below. Physiatrist and rehab team continue to assess barriers to discharge/monitor patient progress toward functional and medical goals.  Function:  Bathing Bathing position   Position:  (sitting on toilet)  Bathing parts Body parts bathed by patient: Right arm, Left arm, Chest Body parts bathed by helper: Buttocks, Front perineal area, Right upper leg, Left upper leg, Right lower leg, Left lower leg, Back  Bathing assist Assist Level: Supervision or verbal cues      Upper Body Dressing/Undressing Upper body dressing   What is the patient wearing?: Pull over shirt/dress  Pull over shirt/dress - Perfomed by helper: Thread/unthread right sleeve, Thread/unthread left sleeve, Put head through opening, Pull shirt over trunk (pt demonstrating confusion with task )        Upper body assist        Lower Body Dressing/Undressing Lower body dressing   What is the patient  wearing?: Socks, Shoes, Underwear, Pants Underwear - Performed by patient: Pull underwear up/down Underwear - Performed by helper: Thread/unthread right underwear leg, Thread/unthread left underwear leg Pants- Performed by patient: Pull pants up/down Pants- Performed by helper: Thread/unthread right pants leg, Thread/unthread left pants leg   Non-skid slipper socks- Performed by helper: Don/doff right sock, Don/doff left sock   Socks - Performed by helper: Don/doff right sock, Don/doff left sock   Shoes - Performed by helper: Don/doff right shoe, Don/doff left shoe, Fasten right, Fasten left          Lower body assist        Toileting Toileting   Toileting steps completed by patient: Adjust clothing after toileting Toileting steps completed by helper: Adjust clothing prior to toileting Toileting Assistive Devices: Grab bar or rail, Toilet aid  Toileting assist Assist level: Touching or steadying assistance (Pt.75%)   Transfers Chair/bed transfer   Chair/bed transfer method: Ambulatory Chair/bed transfer assist level: Maximal assist (Pt 25 - 49%/lift and lower) Chair/bed transfer assistive device: Armrests, Medical sales representative     Max distance: 35 Assist level: Touching or steadying assistance (Pt > 75%)   Wheelchair   Type: Manual Max wheelchair distance: 150 Assist Level: Supervision or verbal cues  Cognition Comprehension Comprehension assist level: Understands basic 50 - 74% of the time/ requires cueing 25 - 49% of the time  Expression Expression assist level: Expresses basic 50 - 74% of the time/requires cueing 25 - 49% of the time. Needs to repeat parts of sentences.  Social Interaction Social Interaction assist level: Interacts appropriately 25 - 49% of time - Needs frequent redirection.  Problem Solving Problem solving assist level: Solves basic less than 25% of the time - needs direction nearly all the time or does not effectively solve problems and  may need a restraint for safety  Memory Memory assist level: Recognizes or recalls less than 25% of the time/requires cueing greater than 75% of the time   Medical Problem List and Plan: 1. 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 DVT Prophylaxis/Anticoagulation: Pharmaceutical: Lovenox is indicated due to immobiltiy 3. Chronic back pain/Pain Management: increased his oxycodone to 7.5mg  for pain control.  -limit other neurosedating meds as possible 4. Mood: LCSW to follow up with patient for evaluation and support.  5. Neuropsych: This patient is not yet capable of making decisions on his own behalf although he is showing some improvement 6. Skin/Wound Care: Daily dressing changes. Wound looks great 7. Fluids/Electrolytes/Nutrition: Monitor I/O. re-check potassium tomorrow 8. COPD:   Oxygen at nights, prn during the day. Flovent bid with rescue inhaler prn. Encourage OOB 9. A fib: On coreg bid. HR is controlled at present. 10 H/o Gout: Continue allopurinol and colchicine.  11. DM type 2: neglected to add amaryl yesterday--begin 1mg  daily today  12. H/o depression: On Prozac and Effexor.provide ego support 13. Chronic diastolic CHF: weight up to 105kg today!!!  ---re-check 110-112kg!!  -continue Low salt diet. Elevate BLE when seated and ACE wrap for edema control.   -likely needs further diuresis. As weights  appear somewhat close to real.   -check labs today including BNP 14. Leucocytosis: Monitor for signs of infection. CBC stable.  15. ABLA: Encourage intake. hgb 10.8 yesterday---labs personally reviewed  -stool heme - 16. Encephalopathy: H/o alcohol abuse. Monitor mentation for now.  17. Dizziness:   Orthostatics negative thus far. Encourage fluids, extra time 18. Constipation: had bm    -senna  LOS (Days) 3 A FACE TO FACE EVALUATION WAS  PERFORMED  Kana Reimann T 06/12/2015 8:40 AM

## 2015-06-12 NOTE — Progress Notes (Signed)
Speech Language Pathology Daily Session Note  Patient Details  Name: [name] MRN: [id number] Date of Birth: 05/13/1946  Today's Date: 06/12/2015 SLP Individual Time: 1303-1330 SLP Individual Time Calculation (min): 27 min  Short Term Goals: Week 1: SLP Short Term Goal 1 (Week 1): Patient will sustain attention to basic functional tasks for 45-60 seconds with Mod verbal cues for redirection  SLP Short Term Goal 2 (Week 1): Patient will recall and verbally identify 2 cognitive and 2 physical deficits with Mod question cues SLP Short Term Goal 3 (Week 1): Patient will demonstrate consistent use (over 3 consecutive sessions) of external aids for re-orientation with Min question cues  SLP Short Term Goal 4 (Week 1): Patient will demonstrate basic problem solving during completion of functional tasks with Mod question cues  SLP Short Term Goal 5 (Week 1): Patient will maintain topic of conversation for 3-4 turns in 70% of opportunities with Mod verbal cues for redirection  SLP Short Term Goal 6 (Week 1): Patient will utilize description and or gestures to express basic information during moments of anomia with Mod vebral cues  Skilled Therapeutic Interventions:  Pt was seen for skilled ST targeting cognitive goals.  Upon arrival, pt was seated upright in recliner with daughter and son in law at bedside.  Pt was awake, lethargic, and pleasantly confused.  Pt was oriented to place, date, time, and situation with supervision cues for use of calendar and clock; however, as session progressed, pt demonstrated intermittent confusion and stated that he thought that he was at home at one point. Pt was able to maintain a topic of conversation for 3-4 turns with mod verbal cues for redirection.  Pt was able to remain alert throughout the duration of today's therapy session although he appeared to fatigue by the end of treatment.  Pt was left in recliner with daughter and son in law at bedside, quick release  belt donned, and call bell left within reach.  Continue per current plan of care.    Function:  Eating Eating                 Cognition Comprehension Comprehension assist level: Understands basic 50 - 74% of the time/ requires cueing 25 - 49% of the time  Expression   Expression assist level: Expresses basic 50 - 74% of the time/requires cueing 25 - 49% of the time. Needs to repeat parts of sentences.  Social Interaction Social Interaction assist level: Interacts appropriately 50 - 74% of the time - May be physically or verbally inappropriate.  Problem Solving Problem solving assist level: Solves basic less than 25% of the time - needs direction nearly all the time or does not effectively solve problems and may need a restraint for safety  Memory Memory assist level: Recognizes or recalls less than 25% of the time/requires cueing greater than 75% of the time    Pain Pain Assessment Pain Assessment: No/denies pain  Therapy/Group: Individual Therapy  Jlee Harkless, Selinda Orion 06/12/2015, 3:40 PM

## 2015-06-12 NOTE — Progress Notes (Addendum)
    Subjective:  Unable to obtain much history from patient - he is a very poor informant. He denies chest pain or shortness of breath. He was seen in consultation by Dr Gwenlyn Found 8/17 when he was admitted with weakness and hypokalemia. 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 Noted on rounds to have increasing weight and with known CHF we are asked to see him to help with management.   Objective:  Vital Signs in the last 24 hours: Temp:  [98.2 F (36.8 C)-98.5 F (36.9 C)] 98.2 F (36.8 C) (09/01 0639) Pulse Rate:  [69-84] 84 (09/01 0937) Resp:  [18] 18 (09/01 0639) BP: (118-135)/(58-80) 118/58 mmHg (09/01 0937) SpO2:  [91 %-96 %] 91 % (09/01 1019) Weight:  [210 lb 6.4 oz (95.437 kg)-248 lb 0.3 oz (112.5 kg)] 248 lb 0.3 oz (112.5 kg) (09/01 0928)  Intake/Output from previous day: 08/31 0701 - 09/01 0700 In: 840 [P.O.:840] Out: 1450 [Urine:1450]  Physical Exam: Pt is alert and oriented, eldelry-appearing obese male in NAD HEENT: normal Neck: JVP - mildly elevated Lungs: inspiratory rales in bases bilaterally CV: irregular with 2/6 SEM at LSB Abd: soft, NT, obese Ext: chronic stasis changes, 1+ pretibial/ankle edema Skin: warm/stasis dermatitis  Lab Results:  Recent Labs  06/10/15 0555 06/11/15 0558  WBC 6.6 7.7  HGB 9.6* 10.8*  PLT 140* 161    Recent Labs  06/10/15 0555  NA 132*  K 3.4*  CL 92*  CO2 31  GLUCOSE 205*  BUN 20  CREATININE 0.81   No results for input(s): TROPONINI in the last 72 hours.  Invalid input(s): CK, MB  Assessment/Plan:  1. Acute on chronic diastolic CHF: will update 2D echo. Does have clinical evidence of mild volume overload and weight is increased but uncertain of accuracy. Will give IV lasix today with close follow-up of electrolytes - recent admission with marked hypokalemia noted.   2. Atrial fibrillation: CHADS-Vasc = 7 (age, DM, stroke 2, HTN, CHF, vascular disease). However, with chronic  Etoh he is not considered appropriate for anticoagulation per outpatient notes. Can readdress if he stops drinking.  3. CAD - no anginal/ischemic symptoms.   Will follow with you. Will order echo today, BMET and BNP in am. Increase KDur to BID with IV lasix. Review CXR already ordered. IV lasix today.   Sherren Mocha, M.D. 06/12/2015, 11:19 AM

## 2015-06-12 NOTE — Care Management Note (Signed)
[facility] Individual Statement of Services  Patient Name:  [name]  Date:  06/12/2015  Welcome to the [facility].  Our goal is to provide you with an individualized program based on your diagnosis and situation, designed to meet your specific needs.  With this comprehensive rehabilitation program, you will be expected to participate in at least 3 hours of rehabilitation therapies Monday-Friday, with modified therapy programming on the weekends.  Your rehabilitation program will include the following services:  Physical Therapy (PT), Occupational Therapy (OT), Speech Therapy (ST), 24 hour per day rehabilitation nursing, Therapeutic Recreaction (TR), Case Management (Social Worker), Rehabilitation Medicine, Nutrition Services and Pharmacy Services  Weekly team conferences will be held on Tuesdays to discuss your progress.  Your Social Worker will talk with you frequently to get your input and to update you on team discussions.  Team conferences with you and your family in attendance may also be held.  Expected length of stay: 8-12 days  Overall anticipated outcome: min assist  Depending on your progress and recovery, your program may change. Your Social Worker will coordinate services and will keep you informed of any changes. Your Social Worker's name and contact numbers are listed  below.  The following services may also be recommended but are not provided by the [facility]:    [place] will be made to provide these services after discharge if needed.  Arrangements include referral to agencies that provide these services.  Your insurance has been verified to be:  Medicare and [employer] Your primary doctor is:  Dr.David Noberto [place]  Pertinent information will be shared with your doctor and your insurance company.  Social Worker:  [place], [place] or ([phone]   Information discussed with and copy given to patient by: Lennart Pall, 06/12/2015, 1:51 PM

## 2015-06-12 NOTE — Progress Notes (Signed)
Occupational Therapy Session Note  Patient Details  Name: [name] MRN: [id number] Date of Birth: Nov 08, 1945  Today's Date: 06/12/2015 OT Individual Time: 1000-1130 OT Individual Time Calculation (min): 90 min    Short Term Goals: No short term goals set  Skilled Therapeutic Interventions/Progress Updates:      Pt seen for BADL retraining of toileting, bathing, and dressing with a focus on activity tolerance, sit >< stand, balance, problem solving. Pt received in bed stating he did not feel well but was willing to try. Pt sat to EOB with slight guiding A. Pt was able to answer orientation questions and recalled conversation from yesterday, but he demonstrated some confusion about events happening today.  RT provided breathing treatment while pt worked on sitting balance at EOB. No posterior lean. Pt tolerated EOB for close to 1 hour during RT tx, 2 doctor visits and engagement in bathing and dressing. Improved initiation with dressing, but needed max cues with problem solving to don shirt. Bed height elevated to allow pt to stand with steadying A to walker. Initially, pt needs max A when he first stands due to posterior lean. He can then maintain static stand with close S and UE support; min - mod with dynamic balance during clothing management.  Pt rested in recliner as his back began to hurt.  He then stood at sink for 15 min for oral care and shaving. Pt can easily lose balance as he crosses midline or is without UE support on at least one hand. Pt on 1L of 02 in session. O2 removed for 20 min for bathing and O2 levels at 89%.  On 1L, pt at 96%.  Pt resting in recliner with quick release belt on  and call light in reach.  Therapy Documentation Precautions:  Precautions Precautions: Fall Required Braces or Orthoses: Other Brace/Splint Other Brace/Splint: abdominal binder Restrictions Weight Bearing Restrictions: No    Vital Signs: Therapy Vitals Pulse Rate: 84 BP: (!) 118/58  mmHg Oxygen Therapy SpO2: 91 % O2 Device: Nasal Cannula O2 Flow Rate (L/min): 2 L/min Pain: Pain Assessment Pain Assessment: No/denies pain ADL:    Function:   Eating Eating               Grooming Oral Care,Brush Teeth, Clean Dentures Activity:      Assist Level: Touching or steadying assistance(Pt > 75%) (in standing)      Wash, Rinse, Dry Face Activity   Assist Level: No help, No cues      Wash, Rinse, Dry Hands Activity   Assist Level: Supervision or verbal cues      Brush, Comb Hair Activity   Assist Level: No help, No cues    Shave Activity   Assist Level: Touching or steadying assistance(Pt > 75%)      Apply Makeup Activity Apply makeup activity did not occur: N/A                                                           Bathing Bathing position      Bathing parts Body parts bathed by patient: Right arm;Left arm;Chest Body parts bathed by helper: Buttocks;Front perineal area;Right upper leg;Left upper leg;Right lower leg;Left lower leg;Back  Bathing assist Assist Level: Supervision or verbal cues       Upper Body Dressing/Undressing  Upper body dressing         Pull over shirt/dress - Perfomed by patient: Put head through opening;Pull shirt over trunk Pull over shirt/dress - Perfomed by helper: Thread/unthread right sleeve;Thread/unthread left sleeve        Upper body assist Assist Level: Supervision or verbal cues       Lower Body Dressing/Undressing Lower body dressing   What is the patient wearing?: Socks;Shoes;Underwear;Pants Underwear - Performed by patient: Pull underwear up/down Underwear - Performed by helper: Thread/unthread right underwear leg;Thread/unthread left underwear leg   Pants- Performed by helper: Thread/unthread right pants leg;Thread/unthread left pants leg;Pull pants up/down;Fasten/unfasten pants   Non-skid slipper socks- Performed by helper: Don/doff right sock;Don/doff left sock   Socks - Performed by helper:  Don/doff right sock;Don/doff left sock   Shoes - Performed by helper: Don/doff right shoe;Don/doff left shoe;Fasten right;Fasten left          Lower body assist         Toileting Toileting          Toileting assist      Bed Mobility Roll left and right activity        Sit to lying activity        Lying to sitting activity   Assist level: Touching or steadying assistance (Pt > 75%, lift 1 leg)    Mobility details      Transfers Sit to stand transfer   Sit to stand assist level: Maximal assist (Pt 25 - 49%/lift and lower) Sit to stand assistive device: Walker  Chair/bed transfer   Chair/bed transfer method: Ambulatory Chair/bed transfer assist level: Maximal assist (Pt 25 - 49%/lift and lower) Chair/bed transfer assistive device: Armrests;Walker      Art therapist Comprehension Comprehension assist level: Understands basic 50 - 74% of the time/ requires cueing 25 - 49% of the time  Expression Expression assist level: Expresses basic 50 - 74% of the time/requires cueing 25 - 49% of the time. Needs to repeat parts of sentences.  Social Interaction Social Interaction assist level: Interacts appropriately 25 - 49% of time - Needs frequent redirection.  Problem Solving Problem solving assist level: Solves basic less than 25% of the time - needs direction nearly all the time or does not effectively solve problems and may need a restraint for safety  Memory Memory assist level: Recognizes or recalls less than 25% of the time/requires cueing greater than 75% of the time    Therapy/Group: Individual Therapy  Latravis Grine 06/12/2015, 1:15 PM

## 2015-06-12 NOTE — Progress Notes (Signed)
Social Work Patient ID: [name], male   DOB: 1946/07/05, 69 y.o.   MRN: [id number]  [name] Curb, LCSW Social Worker Signed  Patient Care Conference 06/11/2015  3:58 PM    Expand All Collapse All   Inpatient RehabilitationTeam Conference and Plan of Care Update Date: 06/10/2015   Time: 2:45 PM     Patient Name: [name]       Medical Record Number: [id number]  Date of Birth: 1946/08/08 Sex: Male         Room/Bed: 4M05C/4M05C-01 Payor Info: Payor: MEDICARE / Plan: MEDICARE PART A AND B / Product Type: *No Product type* /    Admitting Diagnosis: Debility, encephalopathy   Admit Date/Time:  06/09/2015  5:44 PM Admission Comments: No comment available   Primary Diagnosis:  Debility Principal Problem: Debility    Patient Active Problem List     Diagnosis  Date Noted   .  Encephalopathy, metabolic     .  Hypomagnesemia  05/27/2015   .  History of male breast cancer, s/p R mastectomy Jan 2016  05/27/2015   .  General weakness  05/27/2015   .  Generalized weakness  05/26/2015   .  Hypokalemia  10/03/2014   .  Congestive heart failure  10/02/2014   .  Lower extremity pain, left  10/02/2014   .  Diabetes mellitus type 2, controlled  10/02/2014   .  Enterocutaneous fistula     .  Hypertension  08/30/2014   .  COPD (chronic obstructive pulmonary disease)  08/30/2014   .  Intracerebral hemorrhage  08/01/2014   .  Osteoarthritis of both hands  07/30/2014   .  Thrush, oral  07/28/2014   .  Debility  07/25/2014   .  Diabetic polyneuropathy associated with type 2 diabetes mellitus  07/25/2014   .  Gout flare  07/24/2014   .  Pre-operative cardiovascular examination  07/15/2014   .  Family history of malignant neoplasm of gastrointestinal tract  06/12/2014   .  Panniculitis  04/14/2013   .  Seasonal allergies  12/20/2012   .  L1 vertebral fracture  09/07/2012   .  Hypothyroidism  08/30/2012   .  ETOH abuse     .  PAF (paroxysmal atrial fibrillation)     .  Chronic diastolic  heart failure     .  DM (diabetes mellitus) type II uncontrolled, periph vascular disorder  06/20/2009   .  Obstructive sleep apnea  06/20/2009   .  CAROTID STENOSIS  06/20/2009   .  CHRONIC OBSTRUCTIVE PULMONARY DISEASE  06/20/2009   .  Coronary atherosclerosis of native coronary artery  06/20/2009   .  Hypderlipidemia     .  Hypertensive heart disease       Expected Discharge Date: Expected Discharge Date: 06/18/15  Team Members Present: Physician leading conference: Dr. Alger Simons Social Worker Present: Lennart Pall, LCSW Nurse Present: Heather Roberts, RN PT Present: Raylene Everts, PT;Caitlin Penven-Crew, PT OT Present: Salome Spotted, OT;Roanna Epley, COTA SLP Present: Weston Anna, SLP PPS Coordinator present : Daiva Nakayama, RN, CRRN        Current Status/Progress  Goal  Weekly Team Focus   Medical     debility, encephalopathy after incarcerated bowel and ex lap   improve safety and cognition  wound care, pain mgt, cognitive deficits, nutrition    Bowel/Bladder     Pt continent of bowel and bladder with urgancy episodes of both at times.  manage bowel and bladder minimal assist   encourgae use of toleit and if urinal use encourgae pt to hold urinal and do as much as possible   Swallow/Nutrition/ Hydration               ADL's     max to total  A with LB self care, mod - max A transfers, decreased activity tolerance  min A with dynamic balance and toileting for clothing management, supervision toilet transfers, mod A bathing and LB dressing   ADL retraining, balance, activity tolerance, pt/family education    Mobility     mod/max A bed mobility, transfers, steady A gait, supervision w/c  min A all mobility, supervision w/c   strengthening, activity tolerance, standing balance    Communication     Min-Mod A   Min A  use of word-finding strategies   Safety/Cognition/ Behavioral Observations    Max A  Min A  orientation, problem solving, attention, recall, awareness     Pain      5 mg oxy prn q4hrs. pain to abdomen    4 or less  assess pain and medicate as needed    Skin     wet to dry dressing change to abdomen BID. pink blanchable sacrum EPBC used, abrasion to left knee and BLE. OTA.   free from breakdown min assist. Mod-max assist with dressing chnage  assess skin and educate dressing change     Rehab Goals Patient on target to meet rehab goals: Yes *See Care Plan and progress notes for long and short-term goals.    Barriers to Discharge:  cognition, premorbid deficits     Possible Resolutions to Barriers:   NMR, cognitive perceptual rx, pain mgt, family ed     Discharge Planning/Teaching Needs:   home with family to provide 24/7 assistance        Team Discussion:    New eval;  Cognition, wound care and pain issues.  Hope to avoid wound VAC.  Good first day.  Goals set @ min assist overall (likely the baseline) but min/ mod with OT.     Revisions to Treatment Plan:    None    Continued Need for Acute Rehabilitation Level of Care: The patient requires daily medical management by a physician with specialized training in physical medicine and rehabilitation for the following conditions: Daily direction of a multidisciplinary physical rehabilitation program to ensure safe treatment while eliciting the highest outcome that is of practical value to the patient.: Yes Daily medical management of patient stability for increased activity during participation in an intensive rehabilitation regime.: Yes Daily analysis of laboratory values and/or radiology reports with any subsequent need for medication adjustment of medical intervention for : Neurological problems;Post surgical problems;Other  [name] 06/12/2015, 11:17 AM

## 2015-06-13 ENCOUNTER — Inpatient Hospital Stay (HOSPITAL_COMMUNITY): Payer: Medicare Other | Admitting: Physical Therapy

## 2015-06-13 ENCOUNTER — Inpatient Hospital Stay (HOSPITAL_COMMUNITY): Payer: Medicare Other

## 2015-06-13 ENCOUNTER — Inpatient Hospital Stay (HOSPITAL_COMMUNITY): Payer: Medicare Other | Admitting: Speech Pathology

## 2015-06-13 DIAGNOSIS — I5032 Chronic diastolic (congestive) heart failure: Secondary | ICD-10-CM

## 2015-06-13 DIAGNOSIS — R0602 Shortness of breath: Secondary | ICD-10-CM

## 2015-06-13 LAB — BASIC METABOLIC PANEL
Anion gap: 8 (ref 5–15)
BUN: 21 mg/dL — AB (ref 6–20)
CHLORIDE: 93 mmol/L — AB (ref 101–111)
CO2: 33 mmol/L — AB (ref 22–32)
CREATININE: 1 mg/dL (ref 0.61–1.24)
Calcium: 8.6 mg/dL — ABNORMAL LOW (ref 8.9–10.3)
GFR calc Af Amer: >60 mL/min (ref >60)
GFR calc non Af Amer: >60 mL/min (ref >60)
Glucose, Bld: 117 mg/dL — ABNORMAL HIGH (ref 65–99)
Potassium: 4.2 mmol/L (ref 3.5–5.1)
SODIUM: 134 mmol/L — AB (ref 135–145)

## 2015-06-13 LAB — BRAIN NATRIURETIC PEPTIDE: B NATRIURETIC PEPTIDE 5: 346.5 pg/mL — AB (ref 0.0–100.0)

## 2015-06-13 LAB — GLUCOSE, CAPILLARY
GLUCOSE-CAPILLARY: 188 mg/dL — AB (ref 65–99)
Glucose-Capillary: 123 mg/dL — ABNORMAL HIGH (ref 65–99)
Glucose-Capillary: 127 mg/dL — ABNORMAL HIGH (ref 65–99)
Glucose-Capillary: 109 mg/dL — ABNORMAL HIGH (ref 65–99)

## 2015-06-13 NOTE — Plan of Care (Signed)
Plan of Care Note   Patient Details  Name: AYDEN HARDWICK MRN: [id number] Date of Birth: 1946-02-21 Today's Date: 06/13/2015  Pt's plan of care adjusted to 15 hours over 7 days after speaking with care team and discussed with PA as pt currently unable to tolerate current therapy schedule with OT, PT, and SLP.     Shemeika Starzyk 06/13/2015, 2:26 PM   Weston Anna, [place], [place]

## 2015-06-13 NOTE — Progress Notes (Signed)
Physical Therapy Session Note  Patient Details  Name: [name] MRN: [id number] Date of Birth: 1946/07/04  Today's Date: 06/13/2015 PT Individual Time:1100-1200 1530-1600  PT Individual Time Calculation (min): 60 min and 30 min   Short Term Goals: Week 1:  PT Short Term Goal 1 (Week 1): Pt will perform bed mobility with mod A PT Short Term Goal 2 (Week 1): Pt will perform transfers with mod A from a variety of surfaces PT Short Term Goal 3 (Week 1): Pt will amb 100' with min A and RW PT Short Term Goal 4 (Week 1): Pt will propel w/c 150' with supervision PT Short Term Goal 5 (Week 1): Pt will negotiate 4 steps with 2 hands rails and mod A  Skilled Therapeutic Interventions/Progress Updates:    Session 1: Pt received in recliner and c/o fatigue and pain in LEs but agreeable to therapy with encouragement.  PT assisted patient to bathroom with assist detailed below.  Pt transferred from toilet to w/c with RW stand/pivot with mod A for sit<>stand and steady A for pivot.  Pt propels w/c to therapy gym with increased time and verbal cues to stay attended to task.  PT instructs pt in w/c parts management and pt able to lock brakes and remove leg rests with verbal and tactile cues.  PT instructs pt in BLE therex x30 reps while seated in w/c. Pt performs heel/toe raises, LAQ, hip flexion, hip abd with tangerine theraband, and hip add with beach ball to squeeze.  Pt returned to room in w/c and positioned in w/c to eat lunch with call bell in reach and needs met.    Session 2: Pt received supine in bed and agreeable to therapy.  PT instructs patient in supine<>sit with supervision and verbal cues for sequencing and sit<>stand from elevated EOB with RW which patient performs with steady A.  PT instructs patient in ambulation x60' with RW and steady A with verbal cues for upright posture, increased step length, and increased foot clearance with max encouragement to participate.  PT instructs patient in  community level w/c propulsion x100' over even surfaces and x5' over uneven surfaces with verbal encouragement to continue.  Pt returned to room total A for time and positioned in w/c with call bell in reach and needs met.    Therapy Documentation Precautions:  Precautions Precautions: Fall Required Braces or Orthoses: Other Brace/Splint Other Brace/Splint: abdominal binder Restrictions Weight Bearing Restrictions: No   Pain: Pain Assessment Pain Assessment: 0-10 Pain Score: 0-No pain   Function:  Toileting Toileting   Toileting steps completed by helper: Adjust clothing prior to toileting;Adjust clothing after toileting Toileting Assistive Devices: Grab bar or rail Assist level: Touching or steadying assistance (Pt.75%)   Bed Mobility Roll left and right activity   Assist level: Supervision or verbal cues Roll left and right assistive device: HOB elevated;Bedrails  Sit to lying activity   Assist level: Supervision or verbal cues Sit to lying assistive device: HOB elevated;Bedrails  Lying to sitting activity        Mobility details Bed mobility details: Visual cues/gestures for sequencing;Verbal cues for techniques   Transfers Sit to stand transfer   Sit to stand assist level: Maximal assist (Pt 25 - 49%/lift and lower) Sit to stand assistive device: Walker;Armrests  Chair/bed transfer   Chair/bed transfer method: Ambulatory Chair/bed transfer assist level: Maximal assist (Pt 25 - 49%/lift and lower) Chair/bed transfer assistive device: Armrests;Walker   Chair/bed transfer details: Manual facilitation for placement;Manual  facilitation for weight shifting;Verbal cues for precautions/safety;Verbal cues for sequencing   Toilet transfer   Toilet transfer assistive device: Elevated toilet seat/BSC over toilet;Walker;Grab bar   Assist level to toilet: Touching or steadying assistance (Pt > 75%) Assist level from toilet: Moderate assist (Pt 50 - 74%/lift or lower)       Car transfer          Locomotion Ambulation   Assistive device: Walker-rolling Max distance: 60 Assist level: Touching or steadying assistance (Pt > 75%)  Walk 10 feet activity   Assist level: Touching or steadying assistance (Pt > 75%)  Walk 50 feet with 2 turns activity   Assist level: Touching or steadying assistance (Pt > 75%)  Walk 150 feet activity      Walk 10 feet on uneven surfaces activity      Stairs          Walk up/down 1 step activity        Walk up/down 4 steps activity      Walk up/down 12 steps activity      Pick up small objects from floor      Wheelchair   Type: Manual Max wheelchair distance: 150 Assist Level: No help, No cues, assistive device, takes more than reasonable amount of time  Wheel 50 feet with 2 turns activity   Assist Level: No help, No cues, assistive device, takes more than reasonable amount of time  Wheel 150 feet activity   Assist Level: No help, No cues, assistive device, takes more than reasonable amount of time     Therapy/Group: Individual Therapy  Earnest Conroy Penven-Crew 06/13/2015, 4:54 PM

## 2015-06-13 NOTE — Progress Notes (Signed)
SLP Cancellation Note  Patient Details Name: [name] MRN: [id number] DOB: 1945-11-02   Cancelled treatment:       Patient missed 60 minutes of skilled SLP intervention despite Max encouragement due to fatigue, confusion and overall refusal to participate.                                                                                                Dymin Dingledine, [place] 06/13/2015, 4:36 PM

## 2015-06-13 NOTE — Progress Notes (Signed)
[facility] PHYSICAL MEDICINE & REHABILITATION     PROGRESS NOTE    Subjective/Complaints: Feels tired. "don't feel well". Still participating in therapies with extra time  ROS: Pt denies fever, rash/itching, headache, blurred or double vision, nausea, vomiting, abdominal pain, diarrhea, chest pain, shortness of breath, palpitations, dysuria, dizziness, bleeding, anxiety, or depression   Objective: Vital Signs: Blood pressure 141/82, pulse 85, temperature 97.9 F (36.6 C), temperature source Oral, resp. rate 20, height 5\' 10"  (1.778 m), weight 112.5 kg (248 lb 0.3 oz), SpO2 91 %. Dg Chest 2 View  06/12/2015   CLINICAL DATA:  Worsening shortness of breath.  EXAM: CHEST  2 VIEW  COMPARISON:  PA and lateral chest 05/26/2015 and PA and lateral chest 10/02/2014.  FINDINGS: There is cardiomegaly and mild interstitial edema with small bilateral pleural effusions. No pneumothorax. The patient is status post CABG.  IMPRESSION: Mild interstitial edema with small bilateral pleural effusions.   Electronically Signed   By: Inge Rise M.D.   On: 06/12/2015 17:24    Recent Labs  06/11/15 0558 06/12/15 1357  WBC 7.7 7.4  HGB 10.8* 10.0*  HCT 35.5* 32.8*  PLT 161 166    Recent Labs  06/12/15 1357 06/13/15 0445  NA 135 134*  K 4.1 4.2  CL 96* 93*  GLUCOSE 92 117*  BUN 21* 21*  CREATININE 1.02 1.00  CALCIUM 8.6* 8.6*   CBG (last 3)   Recent Labs  06/12/15 1643 06/12/15 2102 06/13/15 0647  GLUCAP 78 157* 123*    Wt Readings from Last 3 Encounters:  06/13/15 112.5 kg (248 lb 0.3 oz)  06/05/15 120.3 kg (265 lb 3.4 oz)  05/29/15 106.641 kg (235 lb 1.6 oz)    Physical Exam:  Constitutional: He is oriented to person, place, and time. He appears well-developed and well-nourished.  HENT:  Head: Normocephalic and atraumatic.  Eyes: Conjunctivae are normal. Pupils are equal, round, and reactive to light.  Neck: Normal range of motion. Neck supple. No tracheal deviation present.  No thyromegaly present.  Cardiovascular: Normal rate. An irregular rhythm present.  Respiratory: Effort normal. No respiratory distress. He has decreased breath sounds in the bases. He has no wheezes. He has no rales. He exhibits no tenderness.  GI: Soft. Bowel sounds are normal. He exhibits no distension. There is mild tenderness.  Abdominal wound continues to be clean with beefy red granulation tissue, some central drainage Ext: He exhibits edema 1+, chronic vascular changes in the legs.   Bruises on bilateral knees with dry abrasion left knee. Both knees generally tender to touch. Also with low back tenderness BLE with vascular changes below the knees to the ankles. 1+ pedal/ankle edema.  Neurological: He is alert and oriented to person, place, and time.  Speech clear.  A little drowsy. Able to follow simple one step commands.   CN exam without focal findings. poor insight and awareness. Oriented to hospital. Attention span 10 seconds or less. UE: 3+ deltoid, bicep, tricep, wrist, 4HI. LE: 2+HF, 3-KE and 4- ADF,APF. Decreased LT in both feet and ankles.  Skin: Skin is warm and dry.  Psychiatric: His speech is normal. He is slowed.  Generally cooperative but still distracted,    Assessment/Plan: 1. Functional deficits secondary to encephalopathy/debility which require 3+ hours per day of interdisciplinary therapy in a comprehensive inpatient rehab setting. Physiatrist is providing close team supervision and 24 hour management of active medical problems listed below. Physiatrist and rehab team continue to assess barriers to discharge/monitor patient  progress toward functional and medical goals.  Function:  Bathing Bathing position   Position:  (sitting on toilet)  Bathing parts Body parts bathed by patient: Right arm, Left arm, Chest Body parts bathed by helper: Buttocks, Front perineal area, Right upper leg, Left upper leg, Right lower leg, Left lower leg, Back  Bathing assist Assist  Level: Supervision or verbal cues      Upper Body Dressing/Undressing Upper body dressing   What is the patient wearing?: Pull over shirt/dress     Pull over shirt/dress - Perfomed by patient: Put head through opening, Pull shirt over trunk Pull over shirt/dress - Perfomed by helper: Thread/unthread right sleeve, Thread/unthread left sleeve        Upper body assist Assist Level: Supervision or verbal cues      Lower Body Dressing/Undressing Lower body dressing   What is the patient wearing?: Socks, Shoes, Underwear, Pants Underwear - Performed by patient: Pull underwear up/down Underwear - Performed by helper: Thread/unthread right underwear leg, Thread/unthread left underwear leg Pants- Performed by patient: Pull pants up/down Pants- Performed by helper: Thread/unthread right pants leg, Thread/unthread left pants leg, Pull pants up/down, Fasten/unfasten pants   Non-skid slipper socks- Performed by helper: Don/doff right sock, Don/doff left sock   Socks - Performed by helper: Don/doff right sock, Don/doff left sock   Shoes - Performed by helper: Don/doff right shoe, Don/doff left shoe, Fasten right, Fasten left          Lower body assist        Toileting Toileting   Toileting steps completed by patient: Adjust clothing after toileting Toileting steps completed by helper: Adjust clothing prior to toileting, Adjust clothing after toileting Toileting Assistive Devices: Grab bar or rail, Toilet aid  Toileting assist Assist level: Touching or steadying assistance (Pt.75%)   Transfers Chair/bed transfer   Chair/bed transfer method: Ambulatory Chair/bed transfer assist level: Maximal assist (Pt 25 - 49%/lift and lower) Chair/bed transfer assistive device: Armrests, Medical sales representative     Max distance: 35 Assist level: Touching or steadying assistance (Pt > 75%)   Wheelchair   Type: Manual Max wheelchair distance: 150 Assist Level: No help, No cues,  assistive device, takes more than reasonable amount of time  Cognition Comprehension Comprehension assist level: Understands basic 90% of the time/cues < 10% of the time  Expression Expression assist level: Expresses basic needs/ideas: With no assist  Social Interaction Social Interaction assist level: Interacts appropriately 75 - 89% of the time - Needs redirection for appropriate language or to initiate interaction.  Problem Solving Problem solving assist level: Solves basic problems with no assist  Memory Memory assist level: Recognizes or recalls 90% of the time/requires cueing < 10% of the time (flucuates, later in shift, pt did not where he was )   Medical Problem List and Plan: 1. 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 DVT Prophylaxis/Anticoagulation: Pharmaceutical: Lovenox is indicated due to immobiltiy 3. Chronic back pain/Pain Management: reduced his oxycodone back to 5mg  q 4 prn  -limiting neurosedating meds as possible 4. Mood: LCSW to follow up with patient for evaluation and support.  5. Neuropsych: This patient is not yet capable of making decisions on his own behalf although he is showing some improvement 6. Skin/Wound Care: Daily dressing changes. Wound looks great 7. Fluids/Electrolytes/Nutrition: Monitor I/O. potassium normal. Continue supplement 8. COPD:   Oxygen at nights, prn during the day. Flovent bid  with rescue inhaler prn. Encourage OOB 9. A fib: On coreg bid. HR is controlled at present. 10 H/o Gout: Continue allopurinol and colchicine.  11. DM type 2: added amaryl yesterday ---observe for response 12. H/o depression: On Prozac and Effexor.provide ego support 13. Chronic diastolic CHF: weight still at 112.5 kg today, cxr with mild interstitial edema  -continue Low salt diet. Elevate BLE when seated and ACE wrap for edema control.   -appreciate cardiology  assistance/diuresis 14. Leucocytosis: Monitor for signs of infection. CBC stable.  15. ABLA: Encourage intake. hgb 10.8 yesterday---labs personally reviewed  -stool heme neg 16. Encephalopathy: H/o alcohol abuse. Monitor mentation for now.  17. Dizziness:   Orthostatics negative thus far. Encourage fluids, extra time 18. Constipation: had bm    -senna  LOS (Days) 4 A FACE TO FACE EVALUATION WAS PERFORMED  SWARTZ,ZACHARY T 06/13/2015 10:59 AM

## 2015-06-13 NOTE — Progress Notes (Signed)
Patient Name: [name] Date of Encounter: 06/13/2015     Principal Problem:   Debility Active Problems:   Chronic diastolic heart failure   PAF (paroxysmal atrial fibrillation)   Diabetic polyneuropathy associated with type 2 diabetes mellitus   COPD (chronic obstructive pulmonary disease)   Encephalopathy, metabolic   Acute on chronic diastolic CHF (congestive heart failure), NYHA class 3    SUBJECTIVE  He says his SOB is not better. Difficult to get to answer questions.    CURRENT MEDS . allopurinol  300 mg Oral Daily  . budesonide  0.25 mg Nebulization BID  . carvedilol  3.125 mg Oral BID WC  . colchicine  0.6 mg Oral Daily  . enoxaparin (LOVENOX) injection  40 mg Subcutaneous Q24H  . FLUoxetine  20 mg Oral Daily  . folic acid  1 mg Oral Daily  . furosemide  80 mg Intravenous BID  . gabapentin  300 mg Oral BID  . glimepiride  1 mg Oral Q breakfast  . insulin aspart  0-20 Units Subcutaneous TID WC  . insulin aspart  0-5 Units Subcutaneous QHS  . ipratropium-albuterol  3 mL Nebulization BID  . levothyroxine  187 mcg Oral QAC breakfast  . magnesium oxide  200 mg Oral BID  . pantoprazole  40 mg Oral QHS  . potassium chloride  20 mEq Oral BID  . senna-docusate  1 tablet Oral QHS  . tamoxifen  20 mg Oral Daily  . venlafaxine XR  37.5 mg Oral Daily    OBJECTIVE  Filed Vitals:   06/12/15 2228 06/13/15 0553 06/13/15 0829 06/13/15 0854  BP:  144/85 141/82   Pulse:  87 85   Temp:  97.9 F (36.6 C)    TempSrc:  Oral    Resp:  20    Height:      Weight:  248 lb 0.3 oz (112.5 kg)    SpO2: 97% 98%  91%    Intake/Output Summary (Last 24 hours) at 06/13/15 0940 Last data filed at 06/13/15 0700  Gross per 24 hour  Intake   1280 ml  Output   1250 ml  Net     30 ml   Filed Weights   06/12/15 0927 06/12/15 0928 06/13/15 0553  Weight: 242 lb 4.6 oz (109.9 kg) 248 lb 0.3 oz (112.5 kg) 248 lb 0.3 oz (112.5 kg)    PHYSICAL EXAM  Pt is alert and oriented,  eldelry-appearing obese male in NAD HEENT: normal Neck: JVP - mildly elevated Lungs: inspiratory rales in bases bilaterally CV: irregular with 2/6 SEM at LSB Abd: soft, NT, obese Ext: chronic stasis changes, 1+ pretibial/ankle edema Skin: warm/stasis dermatitis  Accessory Clinical Findings  CBC  Recent Labs  06/11/15 0558 06/12/15 1357  WBC 7.7 7.4  HGB 10.8* 10.0*  HCT 35.5* 32.8*  MCV 100.0 100.3*  PLT 161 846   Basic Metabolic Panel  Recent Labs  06/12/15 1357 06/13/15 0445  NA 135 134*  K 4.1 4.2  CL 96* 93*  CO2 34* 33*  GLUCOSE 92 117*  BUN 21* 21*  CREATININE 1.02 1.00  CALCIUM 8.6* 8.6*   Liver Function Tests  Recent Labs  06/12/15 1357  AST 17  ALT 12*  ALKPHOS 60  BILITOT 0.4  PROT 5.6*  ALBUMIN 2.2*      Radiology/Studies  Dg Chest 2 View  06/12/2015   CLINICAL DATA:  Worsening shortness of breath.  EXAM: CHEST  2 VIEW  COMPARISON:  PA and  lateral chest 05/26/2015 and PA and lateral chest 10/02/2014.  FINDINGS: There is cardiomegaly and mild interstitial edema with small bilateral pleural effusions. No pneumothorax. The patient is status post CABG.  IMPRESSION: Mild interstitial edema with small bilateral pleural effusions.   Electronically Signed   By: Inge Rise M.D.   On: 06/12/2015 17:24   Dg Chest 2 View  05/26/2015   CLINICAL DATA:  Acute onset of generalized weakness, fever and increased urinary frequency. Initial encounter.  EXAM: CHEST  2 VIEW  COMPARISON:  Chest radiograph from 10/02/2014  FINDINGS: The lungs are mildly hypoexpanded. Vascular crowding and vascular congestion are seen. There is no evidence of pleural effusion or pneumothorax.  The heart is normal in size. The patient is status post median sternotomy. No acute osseous abnormalities are seen.  IMPRESSION: Lungs mildly hypoexpanded, with underlying vascular congestion. No definite focal airspace consolidation seen.   Electronically Signed   By: Garald Balding M.D.   On:  05/26/2015 19:39   Ct Head Wo Contrast  05/26/2015   CLINICAL DATA:  Acute onset of fever and weakness. Initial encounter.  EXAM: CT HEAD WITHOUT CONTRAST  TECHNIQUE: Contiguous axial images were obtained from the base of the skull through the vertex without intravenous contrast.  COMPARISON:  CT of the head performed 08/01/2014  FINDINGS: There is no evidence of acute infarction, mass lesion, or intra- or extra-axial hemorrhage on CT.  Prominence of the ventricles and sulci reflects moderate cortical volume loss. A small chronic infarct is noted at the high left frontoparietal region. Scattered periventricular and subcortical white matter change likely reflects small vessel ischemic microangiopathy. Mild cerebellar atrophy is noted, with a chronic infarct at the inferior left cerebellar hemisphere.  The brainstem and fourth ventricle are within normal limits. The basal ganglia are unremarkable in appearance. No mass effect or midline shift is seen.  There is no evidence of fracture; visualized osseous structures are unremarkable in appearance. The orbits are within normal limits. A small mucus retention cyst or polyp is noted at the right maxillary sinus. The remaining paranasal sinuses and mastoid air cells are well-aerated. No significant soft tissue abnormalities are seen.  IMPRESSION: 1. No acute intracranial pathology seen on CT. 2. Moderate cortical volume loss and scattered small vessel ischemic microangiopathy. 3. Small chronic infarct at the high left frontoparietal region. Mild cerebellar atrophy, with chronic infarct at the inferior left cerebellar hemisphere. 4. Small mucus retention cyst or polyp at the right maxillary sinus.   Electronically Signed   By: Garald Balding M.D.   On: 05/26/2015 20:53   Dg Knee Complete 4 Views Left  05/26/2015   CLINICAL DATA:  Left knee pain/swelling  EXAM: LEFT KNEE - COMPLETE 4+ VIEW  COMPARISON:  03/05/2014  FINDINGS: No fracture or dislocation is seen.  Mild  tricompartmental degenerative changes.  Moderate suprapatellar knee joint effusion.  IMPRESSION: No fracture or dislocation is seen.  Mild degenerative changes.  Moderate suprapatellar knee joint effusion.   Electronically Signed   By: Julian Hy M.D.   On: 05/26/2015 21:15    2D ECHO: 06/12/2015 LV EF: 60% -  65% Study Conclusions - Left ventricle: The cavity size was normal. Systolic function was normal. The estimated ejection fraction was in the range of 60% to 65%. Wall motion was normal; there were no regional wall motion abnormalities. - Aortic valve: Valve mobility was restricted. There was mild stenosis. Peak velocity (S): 248 cm/s. - Mitral valve: Calcified annulus. Mildly thickened, mildly calcified  leaflets . - Left atrium: The atrium was moderately dilated. - Right ventricle: The cavity size was mildly dilated. Wall thickness was normal. - Right atrium: The atrium was moderately dilated.    ASSESSMENT AND PLAN  [name] is a 69 y.o. 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 He was admitted on 06/02/15 for Exp Lap with small bowel resection by Dr. Ninfa Linden and then admitted to inpatient rehab for decompensated state .  He was seen in consultation by Dr Gwenlyn Found 8/17 when he was admitted with weakness and hypokalemia. 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 Noted on rounds to have increasing weight and with known CHF and cardiology was asked to see him to help with management yesterday  1. Acute on chronic diastolic CHF:  -- BNP mildly elevated at 346 and CXR w/ mild interstitial edema with small bilateral pleural effusions and evidence of volume overload on exam -- Started on 80mg  IV Lasix BID. Net neg 260ML, weight 248 and  stable but weights seem inaccurate, creat stable at 1. Electrolytes stable. Would continue IV diuresis today and continue to monitor renal and electrolyte status.  -- 2D ECHO 06/12/15 w/ LVEF 60/-65%, no RWMAs, mild AS, mod dilated LA, mild RV dilation, mod RA dilation  2. Atrial fibrillation: CHADS-Vasc = 7 (age, DM, stroke 2, HTN, CHF, vascular disease). However, with chronic Etoh he is not considered appropriate for anticoagulation per outpatient notes. Can readdress if he stops drinking.  3. CAD - no anginal/ischemic symptoms.    Judy Pimple PA-C  Pager [phone]    Agree with assessment and plan. I/O -870 since admission. Continue IV diuresis. Suspect diastolic dysfunction with EF 60 - 65% and mild AS. Cr stable. Mildly more anemic. Consider checking B12/folate with mild macrocytosis and ETOH history.   Troy Sine, MD, [facility] 06/13/2015 3:46 PM

## 2015-06-13 NOTE — Progress Notes (Signed)
Occupational Therapy Session Note  Patient Details  Name: KATHLEEN LIKINS MRN: [id number] Date of Birth: 1946-07-21  Today's Date: 06/13/2015 OT Individual Time: 1000-1044 OT Individual Time Calculation (min): 44 min  and Today's Date: 06/13/2015 OT Missed Time: 16 Minutes Missed Time Reason: Pain;Patient fatigue   Short Term Goals: Week 1:  OT Short Term Goal 1 (Week 1): STGs = LTGs  Skilled Therapeutic Interventions/Progress Updates:    Pt resting in recliner upon arrival.  Pt initially declined participating in therapy, stating that his legs hurt too much and then his head hurt.  Pt stated he needed to use toilet and amb with RW to bathroom to void.  Pt required mod A for sit<>stand from recliner with initial posterior lean.  Pt required steady A for ambulation and transfer to [facility] over toilet. Pt required assistance with clothing management.  Pt declined any further activity stating that he was in too much pain and too tired.  Pt stood X 4 min for physical exam by PA when walking to bathroom.  Focus on active participation, activity tolerance, functional amb with RW, toilet transfers and toileting to decreased burden of care at discharge.  Therapy Documentation Precautions:  Precautions Precautions: Fall Required Braces or Orthoses: Other Brace/Splint Other Brace/Splint: abdominal binder Restrictions Weight Bearing Restrictions: No General: General OT Amount of Missed Time: 16 Minutes Pain: Pain Assessment Pain Score: 7  Pain Type: Acute pain Pain Location: Leg Pain Orientation: Left Pain Descriptors / Indicators: Aching;Throbbing Pain Intervention(s): RN made aware;Repositioned  Function:                                                            Toileting Toileting   Toileting steps completed by patient: Performs perineal hygiene Toileting steps completed by helper: Adjust clothing after toileting;Adjust clothing prior to toileting    Toileting assist        Transfers       Toilet transfer   Toilet transfer assistive device: Elevated toilet seat/BSC over toilet;Walker;Grab bar       Assist level to toilet: Touching or steadying assistance (Pt > 75%) Assist level from toilet: Touching or steadying assistance (Pt > 75%)  Tub/shower transfer                Therapy/Group: Individual Therapy  Leroy Libman 06/13/2015, 11:16 AM

## 2015-06-14 ENCOUNTER — Inpatient Hospital Stay (HOSPITAL_COMMUNITY): Payer: Medicare Other | Admitting: Speech Pathology

## 2015-06-14 ENCOUNTER — Inpatient Hospital Stay (HOSPITAL_COMMUNITY): Payer: Medicare Other | Admitting: Occupational Therapy

## 2015-06-14 ENCOUNTER — Inpatient Hospital Stay (HOSPITAL_COMMUNITY): Payer: Medicare Other

## 2015-06-14 DIAGNOSIS — J438 Other emphysema: Secondary | ICD-10-CM

## 2015-06-14 DIAGNOSIS — R5381 Other malaise: Secondary | ICD-10-CM

## 2015-06-14 LAB — GLUCOSE, CAPILLARY
GLUCOSE-CAPILLARY: 163 mg/dL — AB (ref 65–99)
GLUCOSE-CAPILLARY: 100 mg/dL — AB (ref 65–99)
Glucose-Capillary: 147 mg/dL — ABNORMAL HIGH (ref 65–99)
Glucose-Capillary: 89 mg/dL (ref 65–99)

## 2015-06-14 NOTE — Progress Notes (Signed)
Occupational Therapy Session Note  Patient Details  Name: [name] MRN: [id number] Date of Birth: 12/11/1945  Today's Date: 06/14/2015 OT Individual Time: 1515-1545   (30 min) OT Individual Time Calculation (min): 30 min    Short Term Goals: Week 1:  OT Short Term Goal 1 (Week 1): STGs = LTGs Week 2:     Skilled Therapeutic Interventions/Progress Updates:    Pt.assisted to gym in wc.  Addressed LUE NMRE using shoulder abduction, and reaching exercises.  Did not show any decreased attention to the left during activity.  He engaged in ball toss, weight ball 2 kg, and catching tennis ball .  Pt had difficulty with catching ball with Left hand.  Pt. Returned to room and transferred to bed and left with all needs in reach.   Therapy Documentation Precautions:  Precautions Precautions: Fall Required Braces or Orthoses: Other Brace/Splint Other Brace/Splint: abdominal binder Restrictions Weight Bearing Restrictions: No General:      Pain: Pain Assessment Pain Assessment: No/denies pain :     Function:   Eating Eating Eating activity did not occur: N/A Eating Assist Level: Set up assist for   Eating Set Up Assist For: Opening containers       Grooming  Bed Mobility Roll left and right activity        Sit to lying activity   Assist level: Touching or steadying assistance (Pt > 75%, lift 1 leg) Sit to lying assistive device: HOB elevated;Bedrails  Lying to sitting activity        Mobility details      Transfers Sit to stand transfer   Sit to stand assist level: Moderate assist (Pt 50 - 74%/lift 2 legs/lift or lower) Sit to stand assistive device: Walker;Armrests  Chair/bed transfer   Chair/bed transfer method: Ambulatory Chair/bed transfer assist level: Moderate assist (Pt 50 - 74%/lift or lower) Chair/bed transfer assistive device: Armrests;Walker   Chair/bed transfer details: Verbal cues for precautions/safety;Verbal cues for safe use of DME/AE;Manual  facilitation for placement;Manual facilitation for weight shifting  Toilet transfer                Tub/shower transfer             Cognition Comprehension Comprehension assist level: Understands basic 90% of the time/cues < 10% of the time  Expression Expression assist level: Expresses basic needs/ideas: With no assist  Social Interaction Social Interaction assist level: Interacts appropriately 75 - 89% of the time - Needs redirection for appropriate language or to initiate interaction.  Problem Solving Problem solving assist level: Solves basic 50 - 74% of the time/requires cueing 25 - 49% of the time  Memory Memory assist level: Recognizes or recalls 50 - 74% of the time/requires cueing 25 - 49% of the time    Therapy/Group: Individual Therapy  Lisa Roca 06/14/2015, 3:23 PM

## 2015-06-14 NOTE — Progress Notes (Signed)
Physical Therapy Session Note  Patient Details  Name: [name] MRN: [id number] Date of Birth: [birth year]  Today's Date: 06/14/2015 PT Individual Time: 1300-1400 PT Individual Time Calculation (min): 60 min   Short Term Goals: Week 1:  PT Short Term Goal 1 (Week 1): Pt will perform bed mobility with mod A PT Short Term Goal 2 (Week 1): Pt will perform transfers with mod A from a variety of surfaces PT Short Term Goal 3 (Week 1): Pt will amb 100' with min A and RW PT Short Term Goal 4 (Week 1): Pt will propel w/c 150' with supervision PT Short Term Goal 5 (Week 1): Pt will negotiate 4 steps with 2 hands rails and mod A  Skilled Therapeutic Interventions/Progress Updates:   Pt performed bed mobility so that PT could don pt's briefs to help hold condom catheter on.    Sit> stand from raised bed, mod assist.  W/c propulsion for activity tolerance, x 75' using bil UEs.  Attempted sit> stand from w/c, in gym, but pt's socks continually slipped on floor.  PT donned pt's shoes, but pt still unable to stand.   Pt performed 10 x 1 bil hip adduction in sitting, focusing on hip/knee/ankle alignment for better body mechanics during sit><stand. Pt counted aloud to avoid Valsalva maneuver.    At //, pt stood with mod assist, pulling up on bars.  RW placed next to him, and pt ambulated x 80' with min guard assist;  w/c and O2 brought by another person.  Pt sat quickly after stating "my left knee is about to give out".   PT returned pt to his room, and quick release belt applied and all needs placed with reach for pt to rest before next therapy.    Therapy Documentation Precautions:  Precautions Precautions: Fall Required Braces or Orthoses: Other Brace/Splint Other Brace/Splint: abdominal binder Restrictions Weight Bearing Restrictions: No General:   Vital Signs: O2 Device: Nasal Cannula O2 Flow Rate (L/min): 2 L/min Pain: none voiced        Function:  Toileting Toileting              Bed Mobility Roll left and right activity   Assist level: Supervision or verbal cues Roll left and right assistive device: HOB elevated;Bedrails  Sit to lying activity        Lying to sitting activity   Assist level: Touching or steadying assistance (Pt > 75%, lift 1 leg)    Mobility details Bed mobility details: Verbal cues for techniques   Transfers Sit to stand transfer   Sit to stand assist level: Moderate assist (Pt 50 - 74%/lift 2 legs/lift or lower) Sit to stand assistive device: Walker (raised bed, RW)  Chair/bed transfer   Chair/bed transfer method: Ambulatory Chair/bed transfer assist level: Touching or steadying assistance (Pt > 75%) Chair/bed transfer assistive device: Walker   Chair/bed transfer details: Verbal cues for sequencing;Verbal cues for Ecologist device: Walker-rolling Max distance: 80 Assist level: Touching or steadying assistance (Pt > 75%)  Walk 10 feet activity      Walk 50 feet with 2 turns activity   Assist level: Touching or steadying assistance (Pt > 75%)  Walk 150 feet activity      Walk 10 feet on uneven surfaces activity  Stairs          Walk up/down 1 step activity        Walk up/down 4 steps activity      Walk up/down 12 steps activity      Pick up small objects from floor      Wheelchair   Type: Manual Max wheelchair distance: 75 Assist Level: No help, No cues, assistive device, takes more than reasonable amount of time  Wheel 50 feet with 2 turns activity      Wheel 150 feet activity       Cognition Comprehension Comprehension assist level: Understands basic 90% of the time/cues < 10% of the time  Expression Expression assist level: Expresses basic needs/ideas: With no assist  Social Interaction Social Interaction assist level: Interacts appropriately 90% of the time - Needs monitoring or encouragement  for participation or interaction.  Problem Solving Problem solving assist level: Solves basic 25 - 49% of the time - needs direction more than half the time to initiate, plan or complete simple activities  Memory Memory assist level: Recognizes or recalls 50 - 74% of the time/requires cueing 25 - 49% of the time    Therapy/Group: Individual Therapy  Shironda Kain 06/14/2015, 5:48 PM

## 2015-06-14 NOTE — Progress Notes (Signed)
   Subjective: No CP  Somce SOB with exertion   Objective: Filed Vitals:   06/13/15 1709 06/13/15 1900 06/14/15 0446 06/14/15 0826  BP: 146/81 111/55 132/77   Pulse: 86 71 77   Temp:  98.4 F (36.9 C) 98 F (36.7 C)   TempSrc:  Oral Oral   Resp:  20 18   Height:      Weight:   247 lb 12.8 oz (112.4 kg)   SpO2:  95% 96% 96%   Weight change: 5 lb 8.2 oz (2.5 kg)  Intake/Output Summary (Last 24 hours) at 06/14/15 1324 Last data filed at 06/14/15 1255  Gross per 24 hour  Intake    840 ml  Output   3500 ml  Net  -2660 ml    General: Alert, awake, oriented x3, in no acute distress Neck:  JVP is normal Heart: Regular rate and rhythm, without murmurs, rubs, gallops.  Lungs: Clear to auscultation.  No rales or wheezes. Exemities:  2+ edema.   Neuro: Grossly intact, nonfocal.  Lab Results: Results for orders placed or performed during the hospital encounter of 06/09/15 (from the past 24 hour(s))  Glucose, capillary     Status: Abnormal   Collection Time: 06/13/15  4:03 PM  Result Value Ref Range   Glucose-Capillary 109 (H) 65 - 99 mg/dL  Glucose, capillary     Status: Abnormal   Collection Time: 06/13/15  9:18 PM  Result Value Ref Range   Glucose-Capillary 188 (H) 65 - 99 mg/dL  Glucose, capillary     Status: Abnormal   Collection Time: 06/14/15  7:01 AM  Result Value Ref Range   Glucose-Capillary 163 (H) 65 - 99 mg/dL  Glucose, capillary     Status: Abnormal   Collection Time: 06/14/15 11:39 AM  Result Value Ref Range   Glucose-Capillary 100 (H) 65 - 99 mg/dL    Studies/Results: No results found.  Medications: Reviewed   @PROBHOSP @  1  Acute on chronic diasttolic CHF  I would continue diuresis with IV lasix    2  PAF CHADS VASc of 7   Not a candidate for anticoag due to EtOH use  HR is good   3  CAD  No symptoms to suggest active ischemia    LOS: 5 days   Dorris Carnes 06/14/2015, 1:24 PM

## 2015-06-14 NOTE — Progress Notes (Signed)
Occupational Therapy Session Note  Patient Details  Name: [name] MRN: [id number] Date of Birth: October 09, 1946  Today's Date: 06/14/2015 OT Individual Time: 7425-9563 OT Individual Time Calculation (min): 54 min    Short Term Goals: Week 1:  OT Short Term Goal 1 (Week 1): STGs = LTGs  Skilled Therapeutic Interventions/Progress Updates:    Upon entering the room, pt seated in recliner chair with no c/o pain this session. Pt declined bathing and dressing this session. He required max encouragement for participation this session and was very argumentative when asked to do any task with therapist. Pt required mod A for lifting assistance to stand from recliner chair with RW. Pt required min verbal cues for hand placement and safety during functional transfers this session. Pt ambulated chair partially towards day room without assist. Pt standing for 6 minutes at table while engaged in card game with L UE supported on table top and min guard for balance. Pt declined further standing or tasks at this time and OT assisted pt back to room via wheelchair. Stand pivot transfer wheelchair >bed with assist for L LE into bed this session.  Bed alarm activated and call bell within reach. RN entering the room as therapist exited. Please see below for further details of session.   Therapy Documentation Precautions:  Precautions Precautions: Fall Required Braces or Orthoses: Other Brace/Splint Other Brace/Splint: abdominal binder Restrictions Weight Bearing Restrictions: No    Function:     Bed Mobility Roll left and right activity        Sit to lying activity   Assist level: Touching or steadying assistance (Pt > 75%, lift 1 leg) Sit to lying assistive device: HOB elevated;Bedrails  Lying to sitting activity        Mobility details      Transfers Sit to stand transfer   Sit to stand assist level: Moderate assist (Pt 50 - 74%/lift 2 legs/lift or lower) Sit to stand assistive device:  Walker;Armrests  Chair/bed transfer   Chair/bed transfer method: Ambulatory Chair/bed transfer assist level: Moderate assist (Pt 50 - 74%/lift or lower) Chair/bed transfer assistive device: Armrests;Walker   Chair/bed transfer details: Verbal cues for precautions/safety;Verbal cues for safe use of DME/AE;Manual facilitation for placement;Manual facilitation for weight shifting  Toilet transfer                Tub/shower transfer             Cognition Comprehension Comprehension assist level: Understands basic 90% of the time/cues < 10% of the time  Expression Expression assist level: Expresses basic needs/ideas: With no assist  Social Interaction Social Interaction assist level: Interacts appropriately 75 - 89% of the time - Needs redirection for appropriate language or to initiate interaction.  Problem Solving Problem solving assist level: Solves basic 50 - 74% of the time/requires cueing 25 - 49% of the time  Memory Memory assist level: Recognizes or recalls 50 - 74% of the time/requires cueing 25 - 49% of the time    Therapy/Group: Individual Therapy  [name] 06/14/2015, 12:29 PM

## 2015-06-14 NOTE — Progress Notes (Signed)
  [place] PHYSICAL MEDICINE & REHABILITATION     PROGRESS NOTE    Subjective/Complaints:  Patient complains of back and leg pain. This is chronic. He is requesting increased medications.  Objective: Vital Signs: Blood pressure 132/77, pulse 77, temperature 98 F (36.7 C), temperature source Oral, resp. rate 18, height 5\' 10"  (1.778 m), weight 247 lb 12.8 oz (112.4 kg), SpO2 96 %.  Ooverweight, elderly male in no acute distress. Chest clear to auscultation.Cardiac exam; S1 and S2 are irregular. Abdominal exam overweight, active bowel sounds, soft. Extremities trace edema at the ankles. Assessment/Plan: 1. Functional deficits secondary to encephalopathy/debility    Medical Problem List and Plan: 1. 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 DVT Prophylaxis/Anticoagulation: Pharmaceutical: Lovenox is indicated due to immobiltiy 3. Chronic back pain/Pain Management: reduced his oxycodone back to 5mg  q 4 prn   Will not add additional medications at this time. 4. Mood: LCSW to follow up with patient for evaluation and support.  5. Neuropsych: This patient is not yet capable of making decisions on his own behalf although he is showing some improvement 6. Skin/Wound Care: Daily dressing changes. Wound looks great 7. Fluids/Electrolytes/Nutrition: Monitor I/O. potassium normal. Continue supplement 8. COPD:   Oxygen at nights, prn during the day. Flovent bid with rescue inhaler prn. Encourage OOB 9. A fib: On coreg bid. HR is controlled at present. BP 111/55-146/81 10 H/o Gout: Continue allopurinol and colchicine.  11. DM type 2: ---observe cbg for response to meds CBG (last 3)   Recent Labs  06/13/15 1149 06/13/15 1603 06/13/15 2118  GLUCAP 127* 109* 188*    12. H/o depression: On Prozac and Effexor.provide ego support 13. Chronic diastolic CHF: weight still at 112.5 kg today, cxr with mild  interstitial edema  -continue Low salt diet.   14. Leucocytosis: Monitor for signs of infection. CBC stable.  15. ABLA: Encourage  Lab Results  Component Value Date   HGB 10.0* 06/12/2015    16. Encephalopathy: H/o alcohol abuse. Monitor mentation for now.  17. Dizziness:   Orthostatics negative thus far. Encourage fluids, extra time 18. Constipation: resolved  LOS (Days) 5 A FACE TO FACE EVALUATION WAS PERFORMED  SWORDS,BRUCE HENRY 06/14/2015 6:42 AM

## 2015-06-14 NOTE — Progress Notes (Signed)
Speech Language Pathology Daily Session Note  Patient Details  Name: [name] MRN: [id number] Date of Birth: October 04, 1946  Today's Date: 06/14/2015 SLP Individual Time: 8786-7672 SLP Individual Time Calculation (min): 45 min  Short Term Goals: Week 1: SLP Short Term Goal 1 (Week 1): Patient will sustain attention to basic functional tasks for 45-60 seconds with Mod verbal cues for redirection  SLP Short Term Goal 2 (Week 1): Patient will recall and verbally identify 2 cognitive and 2 physical deficits with Mod question cues SLP Short Term Goal 3 (Week 1): Patient will demonstrate consistent use (over 3 consecutive sessions) of external aids for re-orientation with Min question cues  SLP Short Term Goal 4 (Week 1): Patient will demonstrate basic problem solving during completion of functional tasks with Mod question cues  SLP Short Term Goal 5 (Week 1): Patient will maintain topic of conversation for 3-4 turns in 70% of opportunities with Mod verbal cues for redirection  SLP Short Term Goal 6 (Week 1): Patient will utilize description and or gestures to express basic information during moments of anomia with Mod vebral cues  Skilled Therapeutic Interventions:  Pt was seen for skilled ST targeting cognitive goals.  Upon arrival, pt was in bed, awake, alert, and reported "feeling better."  Pt was transferred to wheelchair with minimal encouragement and min assist multimodal cues for safety.  Pt completed a basic self care task (brushing his teeth) with min assist verbal cues for locate and obtain items and min assist verbal cues to complete task in a timely manner as he was noted to perseverate on task for much longer than was necessary or appropriate.  Pt required frequent encouragement throughout session to do things for himself instead of asking therapist to do it for him (i.e. Reaching across sink to obtain toothbrush, toothpaste, and cup, opening toothpaste, taking lid off of cup).  Pt  appeared to become frustrated with SLP's attempts to get him to do things for himself.  Pt initiated request to use the restroom and utilized call bell with min assist verbal and visual cues.  Pt was left upright in wheelchair with quick release belt donned and nurse tech present.  Continue per current plan of care.    Function:  Eating Eating                 Cognition Comprehension Comprehension assist level: Understands basic 90% of the time/cues < 10% of the time  Expression   Expression assist level: Expresses basic needs/ideas: With no assist  Social Interaction Social Interaction assist level: Interacts appropriately 75 - 89% of the time - Needs redirection for appropriate language or to initiate interaction.  Problem Solving Problem solving assist level: Solves basic 50 - 74% of the time/requires cueing 25 - 49% of the time  Memory Memory assist level: Recognizes or recalls 50 - 74% of the time/requires cueing 25 - 49% of the time    Pain Pain Assessment Pain Assessment: No/denies pain  Therapy/Group: Individual Therapy  Ebrahim Deremer, Selinda Orion 06/14/2015, 12:18 PM

## 2015-06-15 ENCOUNTER — Inpatient Hospital Stay (HOSPITAL_COMMUNITY): Payer: Medicare Other | Admitting: Occupational Therapy

## 2015-06-15 ENCOUNTER — Inpatient Hospital Stay (HOSPITAL_COMMUNITY): Payer: Medicare Other | Admitting: *Deleted

## 2015-06-15 ENCOUNTER — Inpatient Hospital Stay (HOSPITAL_COMMUNITY): Payer: Medicare Other

## 2015-06-15 DIAGNOSIS — I480 Paroxysmal atrial fibrillation: Secondary | ICD-10-CM

## 2015-06-15 LAB — BASIC METABOLIC PANEL
ANION GAP: 8 (ref 5–15)
BUN: 24 mg/dL — ABNORMAL HIGH (ref 6–20)
CALCIUM: 8.5 mg/dL — AB (ref 8.9–10.3)
CO2: 33 mmol/L — AB (ref 22–32)
CREATININE: 0.97 mg/dL (ref 0.61–1.24)
Chloride: 98 mmol/L — ABNORMAL LOW (ref 101–111)
GLUCOSE: 142 mg/dL — AB (ref 65–99)
Potassium: 4.1 mmol/L (ref 3.5–5.1)
Sodium: 139 mmol/L (ref 135–145)

## 2015-06-15 LAB — GLUCOSE, CAPILLARY
GLUCOSE-CAPILLARY: 98 mg/dL (ref 65–99)
GLUCOSE-CAPILLARY: 117 mg/dL — AB (ref 65–99)
Glucose-Capillary: 110 mg/dL — ABNORMAL HIGH (ref 65–99)
Glucose-Capillary: 168 mg/dL — ABNORMAL HIGH (ref 65–99)

## 2015-06-15 NOTE — Progress Notes (Signed)
  [place] PHYSICAL MEDICINE & REHABILITATION     PROGRESS NOTE    Subjective/Complaints:  Patient continues to have back pain.This is not new. It has been ongoing for years. He is interested in s on a discharge date. He is me.  Objective: Vital Signs: Blood pressure 162/92, pulse 90, temperature 98.2 F (36.8 C), temperature source Oral, resp. rate 18, height 5\' 10"  (1.778 m), weight 247 lb 2.2 oz (112.1 kg), SpO2 98 %.  Ooverweight, elderly male in no acute distress. Chest clear to auscultation.Cardiac exam; S1 and S2 are irregular. Abdominal exam overweight, active bowel sounds, soft. Extremities trace edema at the ankles. Assessment/Plan: 1. Functional deficits secondary to encephalopathy/debility    Medical Problem List and Plan: 1. 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 DVT Prophylaxis/Anticoagulation: Pharmaceutical: Lovenox is indicated due to immobiltiy 3. Chronic back pain/Pain Management: Continue current pain management. Will not add additional medications at this time. 4. Mood: LCSW to follow up with patient for evaluation and support.  5. Neuropsych: This patient is not yet capable of making decisions on his own behalf although he is showing some improvement 6. Skin/Wound Care: Daily dressing changes. Wound looks great 7. Fluids/Electrolytes/Nutrition: Monitor I/O. potassium normal. Continue supplement 8. COPD:   Oxygen at nights, prn during the day. Flovent bid with rescue inhaler prn. Encourage OOB 9. A fib: On coreg bid. HR is controlled at present. 116/58-162/92 10 H/o Gout: Continue allopurinol and colchicine.  11. DM type 2: ---observe cbg for response to meds CBG (last 3)   Recent Labs  06/14/15 1642 06/14/15 2124 06/15/15 0711  GLUCAP 147* 89 98    12. H/o depression: On Prozac and Effexor.provide ego support 13. Chronic diastolic CHF: weight still at 112.5  kg today, cxr with mild interstitial edema  -continue Low salt diet.   14. Leucocytosis resolved Lab Results  Component Value Date   WBC 7.4 06/12/2015    15. ABLA: Encourage  Lab Results  Component Value Date   HGB 10.0* 06/12/2015    16. Encephalopathy: H/o alcohol abuse. Monitor mentation for now.  17. Dizziness:   Orthostatics negative thus far. Encourage fluids, extra time 18. Constipation: resolved  LOS (Days) 6 A FACE TO FACE EVALUATION WAS PERFORMED  Emyah Roznowski HENRY 06/15/2015 8:16 AM

## 2015-06-15 NOTE — Progress Notes (Signed)
Occupational Therapy Session Note  Patient Details  Name: [name] MRN: [id number] Date of Birth: 1946/07/15  Today's Date: 06/15/2015 OT Individual Time:  - 1630-1700 ( 30 min)      Short Term Goals: Week 1:  OT Short Term Goal 1 (Week 1): STGs = LTGs  Skilled Therapeutic Interventions/Progress Updates:    Pt lying in bed upon OT arrival   He reported not feeling well.  He stated his legs are aching 7/10.  Pt agreed to do UE exercises.  He engage in exercises.  He refused to get out of bed for dinner.  He reported being cold.  OT provided more blankets and rubbing to hands.  He  Was more talkative at end of session but did not want to get up.  OT left with all needs in place.    Therapy Documentation Precautions:  Precautions Precautions: Fall Required Braces or Orthoses: Other Brace/Splint Other Brace/Splint: abdominal binder Restrictions Weight Bearing Restrictions: No General: General PT Missed Treatment Reason: Patient unwilling to participate   Pain:  7/10  BLE        Function:              Comprehension Comprehension assist level: Understands basic 90% of the time/cues < 10% of the time  Expression Expression assist level: Expresses basic needs/ideas: With no assist  Social Interaction Social Interaction assist level: Interacts appropriately with others with medication or extra time (anti-anxiety, antidepressant).  Problem Solving Problem solving assist level: Solves basic 50 - 74% of the time/requires cueing 25 - 49% of the time  Memory Memory assist level: Recognizes or recalls 75 - 89% of the time/requires cueing 10 - 24% of the time    Therapy/Group: Individual Therapy  Lisa Roca 06/15/2015, 5:08 PM

## 2015-06-15 NOTE — Progress Notes (Signed)
Physical Therapy Session Note  Patient Details  Name: DJIBRIL GLOGOWSKI MRN: [id number] Date of Birth: 13-Aug-1946  Today's Date: 06/15/2015 PT Individual Time: 1130-1200 PT Individual Time Calculation (min): 30 min   Short Term Goals: Week 1:  PT Short Term Goal 1 (Week 1): Pt will perform bed mobility with mod A PT Short Term Goal 2 (Week 1): Pt will perform transfers with mod A from a variety of surfaces PT Short Term Goal 3 (Week 1): Pt will amb 100' with min A and RW PT Short Term Goal 4 (Week 1): Pt will propel w/c 150' with supervision PT Short Term Goal 5 (Week 1): Pt will negotiate 4 steps with 2 hands rails and mod A  Skilled Therapeutic Interventions/Progress Updates:   Pt stating he is very tired today and does not want to do anything to cause exertion. Agreeable to seated therex and working on sit to stands to address functional strengthening, endurance, and aid with transfers. Pt performed seated heel/toe raises, LAQ, marches, and standing hip marches x 10-15 reps each with rest breaks between sets. Pt required mod A for sit to stand with multiple attempts and cues for technique, using momentum and forward weightshift.   Therapy Documentation Precautions:  Precautions Precautions: Fall Required Braces or Orthoses: Other Brace/Splint Other Brace/Splint: abdominal binder Restrictions Weight Bearing Restrictions: No  Pain:  Reports pain in BLE.   Therapy/Group: Individual Therapy  Canary Brim Ivory Broad, PT, DPT  06/15/2015, 12:15 PM

## 2015-06-15 NOTE — Progress Notes (Signed)
   Subjective: No CP  No SOB at rest   Objective: Filed Vitals:   06/14/15 0826 06/14/15 1620 06/14/15 1742 06/15/15 0604  BP:  116/58  162/92  Pulse:  68  90  Temp:  98.2 F (36.8 C)  98.2 F (36.8 C)  TempSrc:  Oral  Oral  Resp:  18  18  Height:      Weight:    247 lb 2.2 oz (112.1 kg)  SpO2: 96% 96% 99% 98%   Weight change: -10.6 oz (-0.3 kg)  Intake/Output Summary (Last 24 hours) at 06/15/15 1032 Last data filed at 06/15/15 0800  Gross per 24 hour  Intake    840 ml  Output   2275 ml  Net  -1435 ml    General: Alert, awake, oriented x3, in no acute distress Neck:  JVP is normal Heart: Regular rate and rhythm, without murmurs, rubs, gallops.  Lungs: Rel clear Exemities:  Tr edema.   Neuro: Grossly intact, nonfocal.   Lab Results: Results for orders placed or performed during the hospital encounter of 06/09/15 (from the past 24 hour(s))  Glucose, capillary     Status: Abnormal   Collection Time: 06/14/15 11:39 AM  Result Value Ref Range   Glucose-Capillary 100 (H) 65 - 99 mg/dL  Glucose, capillary     Status: Abnormal   Collection Time: 06/14/15  4:42 PM  Result Value Ref Range   Glucose-Capillary 147 (H) 65 - 99 mg/dL  Glucose, capillary     Status: None   Collection Time: 06/14/15  9:24 PM  Result Value Ref Range   Glucose-Capillary 89 65 - 99 mg/dL  Glucose, capillary     Status: None   Collection Time: 06/15/15  7:11 AM  Result Value Ref Range   Glucose-Capillary 98 65 - 99 mg/dL    Studies/Results: No results found.  Medications: Reviewed   @PROBHOSP @  1  Acute on chronic diastolic CHF  Exam is improved from yesterday  Pt says his ankles have never been so good. I would give one more day of IV lasix then switch to po Check BMET in AM  Follow O2 sat  2.  PAF  Not a candidate for anticoagulation due to EtOH  \\  3  CAD    LOS: 6 days   Dorris Carnes 06/15/2015, 10:32 AM

## 2015-06-15 NOTE — Progress Notes (Signed)
Physical Therapy Note  Patient Details  Name: [name] MRN: [id number] Date of Birth: 07-15-1946 Today's Date: 06/15/2015   1300-1315 (15 min ) 60 min missed therapy Patient in room , sitting in the recliner, does not agree to participate in therapy interventions, states he does not feel like doing anything as "he feels awful". Increased encouragement offered, but patient  Continues to refuse,states he wants to go to bed, when offered to be assisted -refused again. Patient is however interested in therapist nationality and place where she finished school, after obtaining that information continues to refuse and asks to be left alone. Situation reported to nursing.   Guadlupe Spanish 06/15/2015, 1:33 PM

## 2015-06-15 NOTE — Progress Notes (Signed)
Lab unable to get his scheduled blood work due to minimal blood return from blood draw. Lab to collect again today.

## 2015-06-15 NOTE — Plan of Care (Signed)
Problem: RH SKIN INTEGRITY Goal: RH STG ABLE TO PERFORM INCISION/WOUND CARE W/ASSISTANCE STG Able To Perform Incision/Wound Care With total Assistance.  Outcome: Not Progressing Staff perform BID dressing change to abd. Wound.

## 2015-06-15 NOTE — Progress Notes (Signed)
Patient weaned off oxygen to RA and and his oxygen saturation dropped to 81% while laying in his bed. Placed back on oxygen at 2L, via [place], oxygen >92%. Will monitor.

## 2015-06-16 ENCOUNTER — Inpatient Hospital Stay (HOSPITAL_COMMUNITY): Payer: Medicare Other | Admitting: Speech Pathology

## 2015-06-16 ENCOUNTER — Inpatient Hospital Stay (HOSPITAL_COMMUNITY): Payer: Medicare Other | Admitting: Physical Therapy

## 2015-06-16 ENCOUNTER — Inpatient Hospital Stay (HOSPITAL_COMMUNITY): Payer: Medicare Other

## 2015-06-16 LAB — GLUCOSE, CAPILLARY
GLUCOSE-CAPILLARY: 137 mg/dL — AB (ref 65–99)
GLUCOSE-CAPILLARY: 101 mg/dL — AB (ref 65–99)
GLUCOSE-CAPILLARY: 163 mg/dL — AB (ref 65–99)
Glucose-Capillary: 108 mg/dL — ABNORMAL HIGH (ref 65–99)

## 2015-06-16 MED ORDER — FUROSEMIDE 40 MG PO TABS
80.0000 mg | ORAL_TABLET | Freq: Two times a day (BID) | ORAL | Status: DC
  Administered 2015-06-16 – 2015-06-18 (×5): 80 mg via ORAL
  Filled 2015-06-16 (×5): qty 2

## 2015-06-16 NOTE — Progress Notes (Signed)
[facility] [facility] PHYSICAL MEDICINE & REHABILITATION     PROGRESS NOTE    Subjective/Complaints: Feels tired. "don't feel well". Still participating in therapies with extra time  ROS: Pt denies fever, rash/itching, headache, blurred or double vision, nausea, vomiting, abdominal pain, diarrhea, chest pain, shortness of breath, palpitations, dysuria, dizziness, bleeding, anxiety, or depression   Objective: Vital Signs: Blood pressure 130/74, pulse 73, temperature 98.3 F (36.8 C), temperature source Oral, resp. rate 17, height 5\' 10"  (1.778 m), weight 111.6 kg (246 lb 0.5 oz), SpO2 92 %. No results found. No results for input(s): WBC, HGB, HCT, PLT in the last 72 hours.  Recent Labs  06/15/15 1822  NA 139  K 4.1  CL 98*  GLUCOSE 142*  BUN 24*  CREATININE 0.97  CALCIUM 8.5*   CBG (last 3)   Recent Labs  06/15/15 1645 06/15/15 2044 06/16/15 0648  GLUCAP 117* 168* 137*    Wt Readings from Last 3 Encounters:  06/16/15 111.6 kg (246 lb 0.5 oz)  06/05/15 120.3 kg (265 lb 3.4 oz)  05/29/15 106.641 kg (235 lb 1.6 oz)    Physical Exam:  Constitutional: He is oriented to person, place, and time. He appears well-developed and well-nourished.  HENT:  Head: Normocephalic and atraumatic.  Eyes: Conjunctivae are normal. Pupils are equal, round, and reactive to light.  Neck: Normal range of motion. Neck supple. No tracheal deviation present. No thyromegaly present.  Cardiovascular: Normal rate. An irregular rhythm present.  Respiratory: Effort normal. No respiratory distress. He has decreased breath sounds in the bases. He has no wheezes. He has no rales. He exhibits no tenderness.  GI: Soft. Bowel sounds are normal. He exhibits no distension. There is mild tenderness.  Abdominal wound continues to be clean with beefy red granulation tissue, some central drainage Ext: He exhibits edema 1+, chronic vascular changes in the legs.   Bruises on bilateral knees with dry abrasion left  knee. Both knees generally tender to touch. Also with low back tenderness BLE with vascular changes below the knees to the ankles. 1+ pedal/ankle edema.  Neurological: He is alert and oriented to person, place, and time.  Speech clear.  A little drowsy. Able to follow simple one step commands.   CN exam without focal findings. poor insight and awareness. Oriented to hospital. Attention span 10 seconds or less. UE: 3+ deltoid, bicep, tricep, wrist, 4HI. LE: 2+HF, 3-KE and 4- ADF,APF. Decreased LT in both feet and ankles.  Skin: Skin is warm and dry.  Psychiatric: His speech is normal. He is slowed.  Generally cooperative but still distracted,    Assessment/Plan: 1. Functional deficits secondary to encephalopathy/debility which require 3+ hours per day of interdisciplinary therapy in a comprehensive inpatient rehab setting. Physiatrist is providing close team supervision and 24 hour management of active medical problems listed below. Physiatrist and rehab team continue to assess barriers to discharge/monitor patient progress toward functional and medical goals.  Function:  Bathing Bathing position   Position:  (sitting on toilet)  Bathing parts Body parts bathed by patient: Right arm, Left arm, Chest, Abdomen Body parts bathed by helper: Front perineal area, Buttocks, Right upper leg, Left upper leg, Right lower leg, Left lower leg, Back  Bathing assist Assist Level: Supervision or verbal cues      Upper Body Dressing/Undressing Upper body dressing   What is the patient wearing?: [place] over shirt/dress - Perfomed by patient: Put head through opening, Pull shirt over trunk Pull  over shirt/dress - Perfomed by helper: Thread/unthread right sleeve, Thread/unthread left sleeve        Upper body assist Assist Level: Supervision or verbal cues      Lower Body Dressing/Undressing Lower body dressing   What is the patient wearing?: Hospital Gown, Non-skid slipper  socks Underwear - Performed by patient: Pull underwear up/down Underwear - Performed by helper: Thread/unthread right underwear leg, Thread/unthread left underwear leg Pants- Performed by patient: Pull pants up/down Pants- Performed by helper: Thread/unthread right pants leg, Thread/unthread left pants leg, Pull pants up/down, Fasten/unfasten pants   Non-skid slipper socks- Performed by helper: Don/doff right sock, Don/doff left sock   Socks - Performed by helper: Don/doff right sock, Don/doff left sock   Shoes - Performed by helper: Don/doff right shoe, Don/doff left shoe, Fasten right, Fasten left          Lower body assist Assist Level: Touching or steadying assistance (Pt > 75%)      Toileting Toileting   Toileting steps completed by patient: Performs perineal hygiene Toileting steps completed by helper: Adjust clothing prior to toileting, Adjust clothing after toileting Toileting Assistive Devices: Grab bar or rail  Toileting assist Assist level: Touching or steadying assistance (Pt.75%)   Transfers Chair/bed transfer   Chair/bed transfer method: Ambulatory Chair/bed transfer assist level: Touching or steadying assistance (Pt > 75%) Chair/bed transfer assistive device: Medical sales representative     Max distance: 80 Assist level: Touching or steadying assistance (Pt > 75%)   Wheelchair   Type: Manual Max wheelchair distance: 75 Assist Level: No help, No cues, assistive device, takes more than reasonable amount of time  Cognition Comprehension Comprehension assist level: Understands basic 90% of the time/cues < 10% of the time  Expression Expression assist level: Expresses basic needs/ideas: With no assist  Social Interaction Social Interaction assist level: Interacts appropriately with others with medication or extra time (anti-anxiety, antidepressant).  Problem Solving Problem solving assist level: Solves basic 50 - 74% of the time/requires cueing 25 - 49% of  the time  Memory Memory assist level: Recognizes or recalls 75 - 89% of the time/requires cueing 10 - 24% of the time   Medical Problem List and Plan: 1. 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 DVT Prophylaxis/Anticoagulation: Pharmaceutical: Lovenox is indicated due to immobiltiy 3. Chronic back pain/Pain Management: reduced his oxycodone back to 5mg  q 4 prn  -limiting neurosedating meds as possible 4. Mood: LCSW to follow up with patient for evaluation and support.  5. Neuropsych: This patient is not yet capable of making decisions on his own behalf although he is showing some improvement 6. Skin/Wound Care: Daily dressing changes. Wound looks great 7. Fluids/Electrolytes/Nutrition: Monitor I/O. potassium normal. Continue supplement 8. COPD:   Oxygen at nights, prn during the day. Flovent bid with rescue inhaler prn. Encourage OOB 9. A fib: On coreg bid. HR is controlled at present. 10 H/o Gout: Continue allopurinol and colchicine.  11. DM type 2: added amaryl with improvement 12. H/o depression: On Prozac and Effexor.provide ego support 13. Chronic diastolic CHF: weight still at 111.6kg, 3L down---close to baseline?  -continue Low salt diet.  .   -appreciate cardiology assistance/diuresis 14. Leucocytosis: Monitor for signs of infection. CBC stable.  15. ABLA: Encourage intake. hgb 10.8 yesterday---labs personally reviewed  -stool heme neg 16. Encephalopathy: H/o alcohol abuse. May not improve much more until back in familiar environment 18. Constipation: had  bm today   -senna  LOS (Days) 7 A FACE TO FACE EVALUATION WAS PERFORMED  Cathrine Krizan T 06/16/2015 9:01 AM

## 2015-06-16 NOTE — Progress Notes (Signed)
Occupational Therapy Session Note  Patient Details  Name: [name] MRN: [id number] Date of Birth: Apr 22, 1946  Today's Date: 06/16/2015 OT Individual Time: 1100-1200 OT Individual Time Calculation (min): 60 min    Short Term Goals: Week 1:  OT Short Term Goal 1 (Week 1): STGs = LTGs  Skilled Therapeutic Interventions/Progress Updates:    Pt resting in w/c upon arrival.  Pt agreeable to participating in therapy but declined bathing this morning.  Pt stated he already had clean clothes on.  Pt propelled to therapy gym and initially engaged in dynamic standing tasks.  Pt required max A for sit<>stand from w/c and steady A during standing tasks.  Pt stood for tasks X 4 (5 min each time) with extended rest breaks in between tasks.  Pt transitioned to [employer] and initially engaged in BUE therex (initial setting 5 min on work load 3).  Pt c/o increased shoulder pain and transitioned to BUE therex with weighted ball (2 kg) including biceps curls and chest presses. Pt propelled back to room and remained in w/c with QRB in place. Focus on activity tolerance, sit<>stand, standing balance, and safety awareness to increase independence with BADLs.  Therapy Documentation Precautions:  Precautions Precautions: Fall Required Braces or Orthoses: Other Brace/Splint Other Brace/Splint: abdominal binder Restrictions Weight Bearing Restrictions: No Pain: Pain Assessment Pain Score: 2  Pain Type: Acute pain Pain Location: Back Pain Orientation: Lower Pain Descriptors / Indicators: Aching Pain Onset: Gradual Patients Stated Pain Goal: 2 Pain Intervention(s): RN made aware      Therapy/Group: Individual Therapy  Leroy Libman 06/16/2015, 12:02 PM

## 2015-06-16 NOTE — Progress Notes (Signed)
Speech Language Pathology Daily Session Note  Patient Details  Name: [name] MRN: [id number] Date of Birth: 09/22/1946  Today's Date: 06/16/2015 SLP Individual Time: 1400-1500 SLP Individual Time Calculation (min): 60 min  Short Term Goals: Week 1: SLP Short Term Goal 1 (Week 1): Patient will sustain attention to basic functional tasks for 45-60 seconds with Mod verbal cues for redirection  SLP Short Term Goal 2 (Week 1): Patient will recall and verbally identify 2 cognitive and 2 physical deficits with Mod question cues SLP Short Term Goal 3 (Week 1): Patient will demonstrate consistent use (over 3 consecutive sessions) of external aids for re-orientation with Min question cues  SLP Short Term Goal 4 (Week 1): Patient will demonstrate basic problem solving during completion of functional tasks with Mod question cues  SLP Short Term Goal 5 (Week 1): Patient will maintain topic of conversation for 3-4 turns in 70% of opportunities with Mod verbal cues for redirection  SLP Short Term Goal 6 (Week 1): Patient will utilize description and or gestures to express basic information during moments of anomia with Mod vebral cues  Skilled Therapeutic Interventions: Skilled treatment session focused on cognitive goals. Upon arrival, patient was sitting upright in the wheelchair and agreeable to participate in treatment session. SLP facilitated by providing Min A verbal and question cues for functional problem solving during a 4 step picture sequencing task and for selective attention to task in a mildly distracting environment for 30 minutes. Patient was independently oriented to place, time and situation throughout the session and required Mod A verbal and visual cues for pathfinding to his room from the dayroom. Patient also demonstrated increased ability to maintain a topic of conversation for ~3-4 turns with Min A. Patient left upright in wheelchair with quick release belt in place and all needs  within reach. Continue with current plan of care.     Function:  Eating Eating   Modified Consistency Diet: Yes Eating Assist Level: Set up assist for   Eating Set Up Assist For: Opening containers       Cognition Comprehension Comprehension assist level: Understands basic 90% of the time/cues < 10% of the time  Expression   Expression assist level: Expresses basic needs/ideas: With no assist  Social Interaction Social Interaction assist level: Interacts appropriately with others with medication or extra time (anti-anxiety, antidepressant).  Problem Solving Problem solving assist level: Solves basic 50 - 74% of the time/requires cueing 25 - 49% of the time  Memory Memory assist level: Recognizes or recalls 50 - 74% of the time/requires cueing 25 - 49% of the time    Pain No reports of pain   Therapy/Group: Individual Therapy  Aleisa Howk 06/16/2015, 3:58 PM

## 2015-06-16 NOTE — Progress Notes (Signed)
Physical Therapy Session Note  Patient Details  Name: [name] MRN: [id number] Date of Birth: [birth year]  Today's Date: 06/16/2015 PT Individual Time: 1000-1100 PT Individual Time Calculation (min): 60 min   Short Term Goals: Week 1:  PT Short Term Goal 1 (Week 1): Pt will perform bed mobility with mod A PT Short Term Goal 2 (Week 1): Pt will perform transfers with mod A from a variety of surfaces PT Short Term Goal 3 (Week 1): Pt will amb 100' with min A and RW PT Short Term Goal 4 (Week 1): Pt will propel w/c 150' with supervision PT Short Term Goal 5 (Week 1): Pt will negotiate 4 steps with 2 hands rails and mod A  Skilled Therapeutic Interventions/Progress Updates:    Pt received supine in bed finishing breathing treatment and agreeable to therapy.  Pt transitioned from supine>sit to R in bed with mod I.  PT assisted patient with dressing, see assist below.  Pt performs sit>stand from EOB with RW and min A with verbal cues for foot and hand placement and forward weight shift.  Pt ambulates 130' with RW and steady A.  PT instructed patient in supine<>sit and rolling R/L in apartment to simulate home environment.  Pt able to complete bed mobility in apartment with mod I.  Pt transfers to w/c with RW and steady A for amb.  Pt propels w/c to therapy gym with supervision and verbal cues for obstacle negotiation. PT instructs patient in BLE therex x30 reps heel/toe raises, seated marching, and ball squeezes.  PT instructs patient in repeated sit<>stand 3x from w/c with RW and steady A with verbal cues for pushing with UEs, foot placement, and forward weight shift.  Pt propelled w/c x150' with supervision and verbal cues.  PT finished propelling pt to room for time and pt positioned in chair with QRB donned with call bell in reach and needs in reach.   Therapy Documentation Precautions:  Precautions Precautions: Fall Required Braces or Orthoses: Other Brace/Splint Other Brace/Splint:  abdominal binder Restrictions Weight Bearing Restrictions: No  Vital Signs: Therapy Vitals:  Oxygen Therapy SpO2: 92 % O2 Device: Room Air  Pain: Pain Assessment Pain Assessment: Faces Pain Score: 2  Faces Pain Scale: Hurts a little bit   Function:   Bed Mobility Roll left and right activity   Assist level: No help, No cues, assistive device, takes more than a reasonable amount of time    Sit to lying activity   Assist level: No help, No cues, assistive device, takes more than a reasonable amount of time    Lying to sitting activity   Assist level: No help, No cues, assistive device, takes more than a reasonable amount of time    Mobility details     Transfers Sit to stand transfer   Sit to stand assist level: Touching or steadying assistance (Pt > 75%/lift 1 leg) Sit to stand assistive device: Walker;Armrests  Chair/bed transfer   Chair/bed transfer method: Ambulatory Chair/bed transfer assist level: Touching or steadying assistance (Pt > 75%)     Chair/bed transfer details: Verbal cues for precautions/safety;Verbal cues for safe use of DME/AE   Personnel officer device: Walker-rolling Max distance: 130 Assist level: Touching or steadying assistance (Pt > 75%)  Walk 10 feet activity   Assist level: Touching  or steadying assistance (Pt > 75%)  Walk 50 feet with 2 turns activity   Assist level: Touching or steadying assistance (Pt > 75%)  Walk 150 feet activity      Walk 10 feet on uneven surfaces activity      Stairs          Walk up/down 1 step activity        Walk up/down 4 steps activity      Walk up/down 12 steps activity      Pick up small objects from floor      Wheelchair   Type: Manual Max wheelchair distance: 100 Assist Level: No help, No cues, assistive device, takes more than reasonable amount of time  Wheel 50 feet with 2 turns activity   Assist Level: No  help, No cues, assistive device, takes more than reasonable amount of time  Wheel 150 feet activity         Therapy/Group: Individual Therapy  Donya Hitch E Penven-Crew 06/16/2015, 12:09 PM

## 2015-06-16 NOTE — Progress Notes (Signed)
     SUBJECTIVE: No chest pain or SOB  BP 130/74 mmHg  Pulse 73  Temp(Src) 98.3 F (36.8 C) (Oral)  Resp 17  Ht 5\' 10"  (1.778 m)  Wt 246 lb 0.5 oz (111.6 kg)  BMI 35.30 kg/m2  SpO2 92%  Intake/Output Summary (Last 24 hours) at 06/16/15 0757 Last data filed at 06/16/15 0240  Gross per 24 hour  Intake   1080 ml  Output   4075 ml  Net  -2995 ml    PHYSICAL EXAM General: Well developed, well nourished, in no acute distress. Alert and oriented x 3.  Psych:  Good affect, responds appropriately Neck: No JVD. No masses noted.  Lungs: Clear bilaterally with no wheezes or rhonci noted.  Heart: RRR with no murmurs noted. Abdomen: Bowel sounds are present. Soft, non-tender.  Extremities: No lower extremity edema.   LABS: Basic Metabolic Panel:  Recent Labs  06/15/15 1822  NA 139  K 4.1  CL 98*  CO2 33*  GLUCOSE 142*  BUN 24*  CREATININE 0.97  CALCIUM 8.5*   Current Meds: . allopurinol  300 mg Oral Daily  . budesonide  0.25 mg Nebulization BID  . carvedilol  3.125 mg Oral BID WC  . colchicine  0.6 mg Oral Daily  . enoxaparin (LOVENOX) injection  40 mg Subcutaneous Q24H  . FLUoxetine  20 mg Oral Daily  . folic acid  1 mg Oral Daily  . furosemide  80 mg Intravenous BID  . gabapentin  300 mg Oral BID  . glimepiride  1 mg Oral Q breakfast  . insulin aspart  0-20 Units Subcutaneous TID WC  . insulin aspart  0-5 Units Subcutaneous QHS  . ipratropium-albuterol  3 mL Nebulization BID  . levothyroxine  187 mcg Oral QAC breakfast  . magnesium oxide  200 mg Oral BID  . pantoprazole  40 mg Oral QHS  . potassium chloride  20 mEq Oral BID  . senna-docusate  1 tablet Oral QHS  . tamoxifen  20 mg Oral Daily  . venlafaxine XR  37.5 mg Oral Daily    ASSESSMENT AND PLAN:  1. Acute on chronic diastolic CHF:  He is negative 3 liters over the last 24 hours (7.7 liters since admission). I think he is near his baseline volume status. Will change to po Lasix today. He is on Lasix 80  mg po BID at home.   2. Atrial fib: Continue Coreg. He is not a candidate for anti-coagulation.   3. CAD: Stable.   Yoshie Kosel  9/5/20167:57 AM

## 2015-06-17 ENCOUNTER — Inpatient Hospital Stay (HOSPITAL_COMMUNITY): Payer: Medicare Other | Admitting: Occupational Therapy

## 2015-06-17 ENCOUNTER — Inpatient Hospital Stay (HOSPITAL_COMMUNITY): Payer: Medicare Other | Admitting: Speech Pathology

## 2015-06-17 ENCOUNTER — Inpatient Hospital Stay (HOSPITAL_COMMUNITY): Payer: Medicare Other | Admitting: Physical Therapy

## 2015-06-17 DIAGNOSIS — E1142 Type 2 diabetes mellitus with diabetic polyneuropathy: Secondary | ICD-10-CM

## 2015-06-17 LAB — GLUCOSE, CAPILLARY
GLUCOSE-CAPILLARY: 115 mg/dL — AB (ref 65–99)
Glucose-Capillary: 124 mg/dL — ABNORMAL HIGH (ref 65–99)
Glucose-Capillary: 88 mg/dL (ref 65–99)
Glucose-Capillary: 128 mg/dL — ABNORMAL HIGH (ref 65–99)

## 2015-06-17 LAB — BASIC METABOLIC PANEL
Anion gap: 10 (ref 5–15)
BUN: 20 mg/dL (ref 6–20)
CALCIUM: 8.7 mg/dL — AB (ref 8.9–10.3)
CO2: 31 mmol/L (ref 22–32)
CREATININE: 0.94 mg/dL (ref 0.61–1.24)
Chloride: 98 mmol/L — ABNORMAL LOW (ref 101–111)
GFR calc Af Amer: >60 mL/min (ref >60)
GFR calc non Af Amer: >60 mL/min (ref >60)
GLUCOSE: 148 mg/dL — AB (ref 65–99)
Potassium: 4.1 mmol/L (ref 3.5–5.1)
Sodium: 139 mmol/L (ref 135–145)

## 2015-06-17 MED ORDER — LOSARTAN POTASSIUM 25 MG PO TABS
25.0000 mg | ORAL_TABLET | Freq: Every day | ORAL | Status: DC
  Administered 2015-06-17 – 2015-06-18 (×2): 25 mg via ORAL
  Filled 2015-06-17 (×2): qty 1

## 2015-06-17 NOTE — Progress Notes (Signed)
Physical Therapy Discharge Summary  Patient Details  Name: [name] MRN: [id number] Date of Birth: 01/03/1946   Patient has met 9 of 9 long term goals due to improved activity tolerance, improved balance, improved postural control, increased strength, improved attention and improved awareness.  Patient to discharge at an ambulatory level supervision for locomotion, min A for transfers.   Patient's daughter did not come for scheduled family education session. PT/OT/SLP provided patient with rehab discharge recommendation sheet for daughter.    Recommendation:  Patient will benefit from ongoing skilled PT services in home health setting to continue to advance safe functional mobility, address ongoing impairments in strength, balance, endurance, and safety awareness, and minimize fall risk.  Equipment: No equipment provided  Reasons for discharge: treatment goals met  Patient/family agrees with progress made and goals achieved: Yes  PT Discharge Precautions/Restrictions Precautions Precautions: Fall Vital Signs Therapy Vitals Pulse Rate: 88 BP: 138/68 mmHg Oxygen Therapy SpO2: 96 % O2 Device: Not Delivered Pulse Oximetry Type: Intermittent Pain Pain Assessment Pain Assessment: 0-10 Pain Score: 7  Pain Location: Leg Pain Orientation: Right;Left Pain Intervention(s): Emotional support   Cognition Overall Cognitive Status: Within Functional Limits for tasks assessed Arousal/Alertness: Awake/alert Orientation Level: Oriented X4 Safety/Judgment: Impaired Comments: cognition impaired but improved from evaluation Sensation Sensation Light Touch: Impaired Detail Light Touch Impaired Details: Impaired RLE;Impaired LLE;Absent LLE;Absent RLE (impaired sesation below L3 dermatome bilaterally ) Coordination Gross Motor Movements are Fluid and Coordinated: Yes Fine Motor Movements are Fluid and Coordinated: No (finger opposition impaired L>R) Finger Nose Finger Test:  decreased accuracy and speed, L worse than R Heel Shin Test: decreased speed, accuracy intact Motor  Motor Motor - Discharge Observations: continued weakness, improved from eval  Mobility Bed Mobility Bed Mobility: Supine to Sit;Sit to Supine Supine to Sit: 6: Modified independent (Device/Increase time) Transfers Transfers: Yes Sit to Stand: 4: Min assist Sit to Stand Details: Verbal cues for safe use of DME/AE;Verbal cues for precautions/safety Stand to Sit: 4: Min assist Stand to Sit Details (indicate cue type and reason): Verbal cues for safe use of DME/AE;Verbal cues for precautions/safety Locomotion  Ambulation Ambulation: Yes Ambulation/Gait Assistance: 4: Min guard Ambulation Distance (Feet): 130 Feet Assistive device: Rolling walker Gait Gait: Yes Gait Pattern: Impaired Gait Pattern: Step-to pattern;Decreased stride length;Narrow base of support;Trunk flexed Stairs / Additional Locomotion Stairs: Yes Stairs Assistance: 4: Min guard Stair Management Technique: Two rails;Alternating pattern;Forwards Number of Stairs: 16 Height of Stairs: 3 Wheelchair Mobility Wheelchair Mobility: Yes Wheelchair Assistance: 5: Supervision Wheelchair Parts Management: Supervision/cueing  Trunk/Postural Assessment  Cervical Assessment Cervical Assessment: Exceptions to [facility] (forward head posture) Thoracic Assessment Thoracic Assessment: Exceptions to [facility] (rounded shoulders) Lumbar Assessment Lumbar Assessment: Exceptions to [facility] (posterior pelvic tilt)  Balance Balance Balance Assessed: Yes Static Standing Balance Static Standing - Level of Assistance: 4: Min assist Dynamic Standing Balance Dynamic Standing - Level of Assistance: 4: Min assist Extremity Assessment      RLE Assessment RLE Assessment: Exceptions to [facility] RLE Strength RLE Overall Strength Comments: 4+/5 hip flexion, knee flexion/ext LLE Assessment LLE Assessment: Exceptions to [facility] LLE Strength LLE Overall  Strength Comments: 4/5 hip flexion, 4+/5 knee flexion/extension  Function:  Herbalist Devices: Grab bar or rail   Bed Mobility Roll left and right activity   Assist level: No help, No cues, assistive device, takes more than a reasonable amount of time  Sit to lying activity      Lying to sitting activity  Assist level: No help, No cues, assistive device, takes more than a reasonable amount of time  Mobility details     Transfers Sit to stand transfer   Sit to stand assist level: Touching or steadying assistance (Pt > 75%/lift 1 leg) Sit to stand assistive device: Walker;Armrests  Chair/bed transfer   Chair/bed transfer method: Ambulatory Chair/bed transfer assist level: Touching or steadying assistance (Pt > 75%) Chair/bed transfer assistive device: Walker   Chair/bed transfer details: Verbal cues for precautions/safety;Verbal cues for safe use of DME/AE   Toilet transfer   Toilet transfer assistive device: Elevated toilet seat/BSC over toilet;Walker    Geneticist, molecular transfer assistive device: Publishing rights manager transfer assist level: Touching or steadying assistance (Pt > 75%, lift 1 leg)    Locomotion Ambulation   Assistive device: Walker-rolling Max distance: 130 Assist level: Touching or steadying assistance (Pt > 75%)  Walk 10 feet activity   Assist level: Touching or steadying assistance (Pt > 75%)  Walk 50 feet with 2 turns activity   Assist level: Touching or steadying assistance (Pt > 75%)  Walk 150 feet activity Walk 150 feet activity did not occur: Refused (pt states he can't walk any further, that his legs are "give out")    Walk 10 feet on uneven surfaces activity   Assist level: Touching or steadying assistance (Pt > 75%) (in apartment)  Stairs   Stairs assistive device: 2 hand rails Max number of stairs: 16 Stairs assist level: Touching or steadying assistance (Pt > 75%)  Walk up/down 1 step activity     Walk up/down 1  step (curb) assist level: Touching or steadying assistance (Pt > 75%)  Walk up/down 4 steps activity   Walk up/down 4 steps assist level: Touching or steadying assistance (Pt > 75%)  Walk up/down 12 steps activity   Walk up/down 12 steps assist level: Touching or steadying assistance (Pt > 75%)  Pick up small objects from floor Pick up small object from the floor (from standing position) activity did not occur: Safety/medical concerns (due to body habitus and safety concerns with balance)    Wheelchair   Type: Manual Max wheelchair distance: 125 Assist Level: No help, No cues, assistive device, takes more than reasonable amount of time  Wheel 50 feet with 2 turns activity   Assist Level: No help, No cues, assistive device, takes more than reasonable amount of time  Wheel 150 feet activity         Kenlee Vogt E Penven-Crew 06/17/2015, 11:21 AM

## 2015-06-17 NOTE — Progress Notes (Signed)
[facility]ICINE [facility] RE[facility]ABILITATION     PROGRESS NOTE    Subjective/Complaints: Pt states he's feeling well. RN reported poor sleep---pt did not report to me.   ROS: Pt denies fever, rash/itching, headache, blurred or double vision, nausea, vomiting, abdominal pain, diarrhea, chest pain, shortness of breath, palpitations, dysuria, dizziness, bleeding, anxiety, or depression   Objective: Vital Signs: Blood pressure 134/65, pulse 77, temperature 98.5 F (36.9 C), temperature source Oral, resp. rate 18, height 5\' 10"  (1.778 m), weight 113.8 kg (250 lb 14.1 oz), SpO2 96 %. No results found. No results for input(s): WBC, HGB, HCT, PLT in the last 72 hours.  Recent Labs  06/15/15 1822  NA 139  K 4.1  CL 98*  GLUCOSE 142*  BUN 24*  CREATININE 0.97  CALCIUM 8.5*   CBG (last 3)   Recent Labs  06/16/15 1629 06/16/15 2116 06/17/15 0638  GLUCAP 163* 108* 124*    Wt Readings from Last 3 Encounters:  06/17/15 113.8 kg (250 lb 14.1 oz)  06/05/15 120.3 kg (265 lb 3.4 oz)  05/29/15 106.641 kg (235 lb 1.6 oz)    Physical Exam:  Constitutional: He is oriented to person, place, and time. He appears well-developed and well-nourished.  HENT:  Head: Normocephalic and atraumatic.  Eyes: Conjunctivae are normal. Pupils are equal, round, and reactive to light.  Neck: Normal range of motion. Neck supple. No tracheal deviation present. No thyromegaly present.  Cardiovascular: Normal rate. An irregular rhythm present.  Respiratory: Effort normal. No respiratory distress. He has decreased breath sounds in the bases. He has no wheezes. He has no rales. He exhibits no tenderness.  GI: Soft. Bowel sounds are normal. He exhibits no distension. There is mild tenderness.  Abdominal wound continues to be clean with beefy red granulation tissue, central area still with slight necrotic tissue Ext: He exhibits edema tr to 1+, chronic vascular changes in the legs.   Bruises on bilateral  knees with dry abrasion left knee. Both knees generally tender to touch. Also with low back tenderness BLE with vascular changes below the knees to the ankles. 1+ pedal/ankle edema.  Neurological: He is alert and oriented to person, place, and time.  Speech clear.  Alert. Able to follow simple one step commands.   CN exam without focal findings. poor insight and awareness. Oriented to hospital. Attention span 10 seconds or less. UE: 3+ deltoid, bicep, tricep, wrist, 4HI. LE: 2+HF, 3-KE and 4- ADF,APF. Decreased LT in both feet and ankles.  Skin: Skin is warm and dry.  Psychiatric: His speech is normal. He is slowed.  Generally cooperative but still distracted,    Assessment/Plan: 1. Functional deficits secondary to encephalopathy/debility which require 3+ hours per day of interdisciplinary therapy in a comprehensive inpatient rehab setting. Physiatrist is providing close team supervision and 24 hour management of active medical problems listed below. Physiatrist and rehab team continue to assess barriers to discharge/monitor patient progress toward functional and medical goals.  Function:  Bathing Bathing position   Position:  (sitting on toilet)  Bathing parts Body parts bathed by patient: Right arm, Left arm, Chest, Abdomen Body parts bathed by helper: Front perineal area, Buttocks, Right upper leg, Left upper leg, Right lower leg, Left lower leg, Back  Bathing assist Assist Level: Supervision or verbal cues      Upper Body Dressing/Undressing Upper body dressing   What is the patient wearing?: [place] over shirt/dress - Perfomed by patient: Put head  through opening, Pull shirt over trunk Pull over shirt/dress - Perfomed by helper: Thread/unthread right sleeve, Thread/unthread left sleeve        Upper body assist Assist Level: Supervision or verbal cues      Lower Body Dressing/Undressing Lower body dressing   What is the patient wearing?: Hospital Gown,  Non-skid slipper socks Underwear - Performed by patient: Pull underwear up/down Underwear - Performed by helper: Thread/unthread right underwear leg, Thread/unthread left underwear leg Pants- Performed by patient: Pull pants up/down Pants- Performed by helper: Thread/unthread right pants leg, Thread/unthread left pants leg, Pull pants up/down, Fasten/unfasten pants   Non-skid slipper socks- Performed by helper: Don/doff right sock, Don/doff left sock   Socks - Performed by helper: Don/doff right sock, Don/doff left sock   Shoes - Performed by helper: Don/doff right shoe, Don/doff left shoe, Fasten right, Fasten left          Lower body assist Assist Level: Touching or steadying assistance (Pt > 75%)      Toileting Toileting   Toileting steps completed by patient: Performs perineal hygiene Toileting steps completed by helper: Adjust clothing prior to toileting, Performs perineal hygiene, Adjust clothing after toileting Toileting Assistive Devices: Grab bar or rail  Toileting assist Assist level: Touching or steadying assistance (Pt.75%)   Transfers Chair/bed transfer   Chair/bed transfer method: Ambulatory Chair/bed transfer assist level: Touching or steadying assistance (Pt > 75%) Chair/bed transfer assistive device: Medical sales representative     Max distance: 130 Assist level: Touching or steadying assistance (Pt > 75%)   Wheelchair   Type: Manual Max wheelchair distance: 100 Assist Level: No help, No cues, assistive device, takes more than reasonable amount of time  Cognition Comprehension Comprehension assist level: Understands basic 90% of the time/cues < 10% of the time  Expression Expression assist level: Expresses basic needs/ideas: With no assist  Social Interaction Social Interaction assist level: Interacts appropriately with others with medication or extra time (anti-anxiety, antidepressant).  Problem Solving Problem solving assist level: Solves basic 50  - 74% of the time/requires cueing 25 - 49% of the time  Memory Memory assist level: Recognizes or recalls 50 - 74% of the time/requires cueing 25 - 49% of the time   Medical Problem List and Plan: 1. 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 DVT Prophylaxis/Anticoagulation: Pharmaceutical: Lovenox is indicated due to immobiltiy 3. Chronic back pain/Pain Management: prn oxycodone  -limiting neurosedating meds as possible 4. Mood: LCSW to follow up with patient for evaluation and support.  5. Neuropsych: This patient is   capable of making decisions on his own behalf   -cognition improved today 6. Skin/Wound Care: Daily dressing changes. Wound looks great 7. Fluids/Electrolytes/Nutrition: Monitor I/O. potassium normal. Continue supplement 8. COPD:   Oxygen at nights, prn during the day. Flovent bid with rescue inhaler prn. Encourage OOB 9. A fib: On coreg bid. HR is controlled at present. 10 H/o Gout: Continue allopurinol and colchicine.  11. DM type 2: added amaryl with improvement 12. H/o depression: On Prozac and Effexor.provide ego support 13. Chronic diastolic CHF: weight still at 111.6kg, 3L down---close to baseline  -continue Low salt diet.  .   -appreciate cardiology assistance/diuresis 14. Leucocytosis: Monitor for signs of infection. CBC stable.  15. ABLA: Encourage intake. hgb 10.8 yesterday---labs personally reviewed  -stool heme neg 16. Encephalopathy: H/o alcohol abuse. May not improve much more until back in familiar environment 18.  Constipation: had bm over weekend  -senna  LOS (Days) 8 A FACE TO FACE EVALUATION WAS PERFORMED  [name] 06/17/2015 7:42 AM

## 2015-06-17 NOTE — Progress Notes (Addendum)
     SUBJECTIVE: No chest pain or SOB, Net even on po lasix now at home dose  BP 138/68 mmHg  Pulse 79  Temp(Src) 98.5 F (36.9 C) (Oral)  Resp 18  Ht 5\' 10"  (1.778 m)  Wt 250 lb 14.1 oz (113.8 kg)  BMI 36.00 kg/m2  SpO2 96%  Intake/Output Summary (Last 24 hours) at 06/17/15 1017 Last data filed at 06/17/15 0700  Gross per 24 hour  Intake   1320 ml  Output   1450 ml  Net   -130 ml    PHYSICAL EXAM General: Well developed, well nourished, in no acute distress. Alert and oriented x 3.  Psych:  Good affect, responds appropriately Neck: No JVD. No masses noted.  Lungs: Clear bilaterally with no wheezes or rhonci noted.  Heart: RRR with no murmurs noted. Abdomen: Bowel sounds are present. Soft, non-tender.  Extremities: No lower extremity edema.   LABS: Basic Metabolic Panel:  Recent Labs  06/15/15 1822 06/17/15 0754  NA 139 139  K 4.1 4.1  CL 98* 98*  CO2 33* 31  GLUCOSE 142* 148*  BUN 24* 20  CREATININE 0.97 0.94  CALCIUM 8.5* 8.7*   Current Meds: . allopurinol  300 mg Oral Daily  . budesonide  0.25 mg Nebulization BID  . carvedilol  3.125 mg Oral BID WC  . colchicine  0.6 mg Oral Daily  . enoxaparin (LOVENOX) injection  40 mg Subcutaneous Q24H  . FLUoxetine  20 mg Oral Daily  . folic acid  1 mg Oral Daily  . furosemide  80 mg Oral BID  . gabapentin  300 mg Oral BID  . glimepiride  1 mg Oral Q breakfast  . insulin aspart  0-20 Units Subcutaneous TID WC  . insulin aspart  0-5 Units Subcutaneous QHS  . ipratropium-albuterol  3 mL Nebulization BID  . levothyroxine  187 mcg Oral QAC breakfast  . magnesium oxide  200 mg Oral BID  . pantoprazole  40 mg Oral QHS  . potassium chloride  20 mEq Oral BID  . senna-docusate  1 tablet Oral QHS  . tamoxifen  20 mg Oral Daily  . venlafaxine XR  37.5 mg Oral Daily    ASSESSMENT AND PLAN:  1. Acute on chronic diastolic CHF:  LVEF 31-59%, Labs stable. I's/O's are even on po lasix. Would continue lasix 80 mg po BID.  Also on low dose carvedilol. Add low dose losartan 25 mg daily - renal function stable, BP high normal.  2. Atrial fib: CHADSVASC score of 6, however, due to etoh abuse, felt not to be a candidate for long-term anti-coagulation.   3. CAD: Stable.   Recommend outpatient follow-up with Dr. Angelena Form after discharge. No further recommendations at this time. Cardiology will sign-off. Call with questions.  Pixie Casino, MD, [facility] Attending Cardiologist [place]  9/6/201610:17 AM

## 2015-06-17 NOTE — Progress Notes (Signed)
Occupational Therapy Session Note  Patient Details  Name: [name] MRN: [id number] Date of Birth: 17-Jan-1946  Today's Date: 06/17/2015 OT Individual Time: 1700-1749 OT Individual Time Calculation (min): 57 min    Short Term Goals: Week 1:  OT Short Term Goal 1 (Week 1): STGs = LTGs  Skilled Therapeutic Interventions/Progress Updates:    Pt seen for BADL retraining with a focus on functional mobility skills of sit><stand and standing balance with RW to increase independence with transfers and mobility. Pt received in w/c. Stood at sink to groom, ambulated to toilet to urinate, bathe and dress from toilet. Pt continues to need a great deal of A with self care due to body habitus and no desire to use AE.  Pt has improved significantly with his activity tolerance, balance, and sit>< stand/transfers with Supervision.  Pt recalls techniques to stand easily without cues. Pt can not shower due to MD orders from abdominal wound.  Pt practiced "dry run" of stepping in and out of shower with ledge with no difficulty at a steadying A level.  Pt resting in chair with quick release belt with all needs met.  Therapy Documentation Precautions:  Precautions Precautions: Fall Required Braces or Orthoses: Other Brace/Splint Other Brace/Splint: abdominal binder Restrictions Weight Bearing Restrictions: No    Vital Signs: Therapy Vitals Pulse Rate: 88 Oxygen Therapy SpO2: 96 % O2 Device: Not Delivered Pulse Oximetry Type: Intermittent Pain: Pain Assessment Pain Assessment: No/denies pain   Function:   Eating Eating               Grooming Oral Care,Brush Teeth, Clean Dentures Activity:      Assist Level: No help, No cues      Wash, Rinse, Dry Face Activity   Assist Level: No help, No cues      Wash, Rinse, Dry Hands Activity   Assist Level: No help, No cues      Brush, Comb Hair Activity   Assist Level: No help, No cues    Shave Activity          Apply Makeup  Activity Apply makeup activity did not occur: N/A                                                           Bathing Bathing position   Position: Other (comment) (sitting on BSC)  Bathing parts Body parts bathed by patient: Right arm;Left arm;Chest;Right upper leg;Left upper leg Body parts bathed by helper: Left lower leg;Right lower leg;Front perineal area;Buttocks  Bathing assist         Upper Body Dressing/Undressing Upper body dressing   What is the patient wearing?: Pull over shirt/dress     Pull over shirt/dress - Perfomed by patient: Thread/unthread right sleeve;Thread/unthread left sleeve;Put head through opening;Pull shirt over trunk Pull over shirt/dress - Perfomed by helper: Pull shirt over trunk        Upper body assist Assist Level: Set up       Lower Body Dressing/Undressing Lower body dressing   What is the patient wearing?: Underwear;Pants;Socks;Shoes Underwear - Performed by patient: Pull underwear up/down Underwear - Performed by helper: Thread/unthread right underwear leg;Thread/unthread left underwear leg Pants- Performed by patient: Pull pants up/down Pants- Performed by helper: Thread/unthread left pants leg;Thread/unthread right pants leg   Non-skid slipper socks-  Performed by helper: Don/doff left sock;Don/doff right sock   Socks - Performed by helper: Don/doff right sock;Don/doff left sock   Shoes - Performed by helper: Don/doff right shoe;Don/doff left shoe;Fasten right;Fasten left          Lower body assist         Toileting Toileting   Toileting steps completed by patient: Adjust clothing prior to toileting;Adjust clothing after toileting Toileting steps completed by helper: Performs perineal hygiene Toileting Assistive Devices: Grab bar or rail  Toileting assist Assist level: Touching or steadying assistance (Pt.75%)    Bed Mobility Roll left and right activity   Assist level: No help, No cues, assistive device, takes more than a  reasonable amount of time Roll left and right assistive device: Bedrails  Sit to lying activity        Lying to sitting activity   Assist level: No help, No cues, assistive device, takes more than a reasonable amount of time Lying to sitting assistive device: Bedrails  Mobility details      Transfers Sit to stand transfer   Sit to stand assist level: Supervision or verbal cues (from w/c) Sit to stand assistive device: Walker;Armrests  Chair/bed transfer   Chair/bed transfer method: Ambulatory Chair/bed transfer assist level: Touching or steadying assistance (Pt > 75%) Chair/bed transfer assistive device: Walker   Chair/bed transfer details: Verbal cues for precautions/safety;Verbal cues for safe use of DME/AE  Toilet transfer   Toilet transfer assistive device: Elevated toilet seat/BSC over toilet;Walker       Assist level to toilet: Supervision or verbal cues Assist level from toilet: Supervision or verbal cues  Tub/shower transfer Tub/shower transfer activity did not occur: Safety/medical concerns           Cognition Comprehension Comprehension assist level: Understands basic 90% of the time/cues < 10% of the time  Expression Expression assist level: Expresses basic needs/ideas: With no assist  Social Interaction Social Interaction assist level: Interacts appropriately 75 - 89% of the time - Needs redirection for appropriate language or to initiate interaction. (inappropriate at times)  Problem Solving Problem solving assist level: Solves basic 50 - 74% of the time/requires cueing 25 - 49% of the time  Memory Memory assist level: Recognizes or recalls 50 - 74% of the time/requires cueing 25 - 49% of the time    Therapy/Group: Individual Therapy  [place] 06/17/2015, 1:07 PM

## 2015-06-17 NOTE — Progress Notes (Signed)
Occupational Therapy Discharge Summary  Patient Details  Name: [name] MRN: [id number] Date of Birth: 1946-09-09   Patient has met 8 of 8 long term goals due to improved activity tolerance, improved balance and ability to compensate for deficits.  Patient to discharge at [facility] Assist level.  Patient's daughter  is independent to provide the necessary physical and cognitive assistance at discharge.  She did not attend family education, but pt is at the same functional level he was at at home. No change in the amount or type of care she provided for him at home.  Reasons goals not met: n/a  Recommendation:  No further OT services recommended at this time.  Equipment: No equipment provided  Reasons for discharge: treatment goals met  Patient/family agrees with progress made and goals achieved: Yes  OT Discharge ADL  See below Vision/Perception  Vision- Assessment Vision Assessment?: No apparent visual deficits  Cognition Overall Cognitive Status: Within Functional Limits for tasks assessed Arousal/Alertness: Awake/alert Orientation Level: Oriented X4 Attention: Selective Sustained Attention: Appears intact Sustained Attention Impairment: Functional basic;Verbal basic Selective Attention: Impaired Selective Attention Impairment: Functional basic;Verbal basic Memory: Impaired Memory Impairment: Storage deficit;Retrieval deficit;Decreased recall of new information;Decreased long term memory;Decreased short term memory Safety/Judgment: Impaired Comments: cognition impaired but improved from evaluation Sensation Sensation Light Touch: Impaired Detail Light Touch Impaired Details: Impaired RLE;Impaired LLE;Absent LLE;Absent RLE (impaired sesation below L3 dermatome bilaterally ) Stereognosis: Impaired by gross assessment Hot/Cold: Appears Intact Proprioception: Impaired by gross assessment (BLE) Coordination Gross Motor Movements are Fluid and Coordinated:  Yes Fine Motor Movements are Fluid and Coordinated: No (finger opposition impaired L>R) Finger Nose Finger Test: decreased accuracy and speed, L worse than R Heel Shin Test: decreased speed, accuracy intact Motor  Motor Motor - Discharge Observations: continued weakness, improved from eval Mobility  Bed Mobility Bed Mobility: Supine to Sit;Sit to Supine Supine to Sit: 6: Modified independent (Device/Increase time) Transfers Sit to Stand: 4: Min assist Sit to Stand Details: Verbal cues for safe use of DME/AE;Verbal cues for precautions/safety Stand to Sit: 4: Min assist Stand to Sit Details (indicate cue type and reason): Verbal cues for safe use of DME/AE;Verbal cues for precautions/safety  Trunk/Postural Assessment  Cervical Assessment Cervical Assessment: Exceptions to [facility] (forward head posture) Thoracic Assessment Thoracic Assessment: Exceptions to [facility] (rounded shoulders) Lumbar Assessment Lumbar Assessment: Exceptions to [facility] (posterior pelvic tilt)  Balance Balance Balance Assessed: Yes Static Standing Balance Static Standing - Level of Assistance: 4: Min assist Dynamic Standing Balance Dynamic Standing - Level of Assistance: 4: Min assist Extremity/Trunk Assessment RUE Assessment RUE Assessment: Within Functional Limits LUE Assessment LUE Assessment: Within Functional Limits  Function:   Eating Eating               Grooming Oral Care,Brush Teeth, Clean Dentures Activity:      Assist Level: No help, No cues      Wash, Rinse, Dry Face Activity   Assist Level: No help, No cues      Wash, Rinse, Dry Hands Activity   Assist Level: No help, No cues      Brush, Comb Hair Activity   Assist Level: No help, No cues    Shave Activity          Apply Makeup Activity Apply makeup activity did not occur: N/A  Bathing Bathing position   Position: Other (comment) (sitting on BSC)  Bathing parts Body  parts bathed by patient: Right arm;Left arm;Chest;Right upper leg;Left upper leg Body parts bathed by helper: Left lower leg;Right lower leg;Front perineal area;Buttocks  Bathing assist         Upper Body Dressing/Undressing Upper body dressing   What is the patient wearing?: Pull over shirt/dress     Pull over shirt/dress - Perfomed by patient: Thread/unthread right sleeve;Thread/unthread left sleeve;Put head through opening;Pull shirt over trunk Pull over shirt/dress - Perfomed by helper: Pull shirt over trunk        Upper body assist Assist Level: Set up       Lower Body Dressing/Undressing Lower body dressing   What is the patient wearing?: Underwear;Pants;Socks;Shoes Underwear - Performed by patient: Pull underwear up/down Underwear - Performed by helper: Thread/unthread right underwear leg;Thread/unthread left underwear leg Pants- Performed by patient: Pull pants up/down Pants- Performed by helper: Thread/unthread left pants leg;Thread/unthread right pants leg   Non-skid slipper socks- Performed by helper: Don/doff left sock;Don/doff right sock   Socks - Performed by helper: Don/doff right sock;Don/doff left sock   Shoes - Performed by helper: Don/doff right shoe;Don/doff left shoe;Fasten right;Fasten left          Lower body assist         Toileting Toileting   Toileting steps completed by patient: Adjust clothing prior to toileting;Adjust clothing after toileting Toileting steps completed by helper: Performs perineal hygiene Toileting Assistive Devices: Grab bar or rail  Toileting assist Assist level: Touching or steadying assistance (Pt.75%)    Bed Mobility Roll left and right activity   Assist level: No help, No cues, assistive device, takes more than a reasonable amount of time Roll left and right assistive device: Bedrails  Sit to lying activity        Lying to sitting activity   Assist level: No help, No cues, assistive device, takes more than a  reasonable amount of time Lying to sitting assistive device: Bedrails  Mobility details      Transfers Sit to stand transfer   Sit to stand assist level: Supervision or verbal cues (from w/c) Sit to stand assistive device: Walker;Armrests  Chair/bed transfer   Chair/bed transfer method: Ambulatory Chair/bed transfer assist level: Touching or steadying assistance (Pt > 75%) Chair/bed transfer assistive device: Walker   Chair/bed transfer details: Verbal cues for precautions/safety;Verbal cues for safe use of DME/AE  Toilet transfer   Toilet transfer assistive device: Elevated toilet seat/BSC over toilet;Walker       Assist level to toilet: Supervision or verbal cues Assist level from toilet: Supervision or verbal cues  Tub/shower transfer Tub/shower transfer activity did not occur: Safety/medical concerns           Cognition Comprehension Comprehension assist level: Understands basic 90% of the time/cues < 10% of the time  Expression Expression assist level: Expresses basic needs/ideas: With no assist  Social Interaction Social Interaction assist level: Interacts appropriately 75 - 89% of the time - Needs redirection for appropriate language or to initiate interaction. (inappropriate at times)  Problem Solving Problem solving assist level: Solves basic 50 - 74% of the time/requires cueing 25 - 49% of the time  Memory Memory assist level: Recognizes or recalls 50 - 74% of the time/requires cueing 25 - 49% of the time    [place] 06/17/2015, 1:16 PM

## 2015-06-17 NOTE — Progress Notes (Signed)
Physical Therapy Session Note  Patient Details  Name: [name] MRN: [id number] Date of Birth: 1946/08/18  Today's Date: 06/17/2015 PT Individual Time: 0900-1000 PT Individual Time Calculation (min): 60 min   Short Term Goals: Week 1:  PT Short Term Goal 1 (Week 1): Pt will perform bed mobility with mod A PT Short Term Goal 2 (Week 1): Pt will perform transfers with mod A from a variety of surfaces PT Short Term Goal 3 (Week 1): Pt will amb 100' with min A and RW PT Short Term Goal 4 (Week 1): Pt will propel w/c 150' with supervision PT Short Term Goal 5 (Week 1): Pt will negotiate 4 steps with 2 hands rails and mod A  Skilled Therapeutic Interventions/Progress Updates:    Pt received supine in bed asleep, but easily aroused to voice.  Pt agreeable to therapy session.  Pt performs supine>sit transfer mod I and sit<>stand from minimally elevated bed with RW and supervision with verbal cues for foot placement and forward weight shift.  PT assisted pt to toilet and assisted with UB/LB dressing.  UB dressing with set up assist and assist to pull shirt over trunk.  LB dressing total assist.  See below for toilet transfer details.  Pt amb x130' with RW and supervision with verbal cues for walker position and step length.  PT instructed patient in car transfer with min A to rise from low seat with verbal cues for foot placement and hand placement.  PT instructs patient in stair negotiation x16 steps (3") with supervision/steady A. Pt negotiates steps with 2 hand rails, reciprocal and forward ascent, step to and forward descent.  Pt reports feeling "swimmy headed" after stair negotiation, vitals assessed and WNL (see documentation below).  Pt propels w/c x100' with mod I back to room and handed off to SLP.    Therapy Documentation Precautions:  Precautions Precautions: Fall Required Braces or Orthoses: Other Brace/Splint Other Brace/Splint: abdominal binder Restrictions Weight Bearing  Restrictions: No   Vital Signs: Therapy Vitals Pulse Rate: 88 BP: 138/68 mmHg Oxygen Therapy SpO2: 96 % O2 Device: Not Delivered Pulse Oximetry Type: Intermittent   Pain: Pain Assessment Pain Assessment: 0-10 Pain Score: 7  Pain Location: Leg Pain Orientation: Right;Left Pain Intervention(s): Emotional support   Function:  Toileting Toileting   Toileting steps completed by patient: Performs perineal hygiene Toileting steps completed by helper: Adjust clothing prior to toileting;Adjust clothing after toileting Toileting Assistive Devices: Grab bar or rail Assist level: Touching or steadying assistance (Pt.75%)   Bed Mobility Roll left and right activity   Assist level: No help, No cues, assistive device, takes more than a reasonable amount of time Roll left and right assistive device: Bedrails  Sit to lying activity        Lying to sitting activity   Assist level: No help, No cues, assistive device, takes more than a reasonable amount of time Lying to sitting assistive device: Bedrails  Mobility details     Transfers Sit to stand transfer   Sit to stand assist level: Touching or steadying assistance (Pt > 75%/lift 1 leg) Sit to stand assistive device: Walker;Armrests  Chair/bed transfer   Chair/bed transfer method: Ambulatory Chair/bed transfer assist level: Touching or steadying assistance (Pt > 75%) Chair/bed transfer assistive device: Walker   Chair/bed transfer details: Verbal cues for precautions/safety;Verbal cues for safe use of DME/AE   Toilet transfer   Toilet transfer assistive device: Elevated toilet seat/BSC over toilet;Walker   Assist level to toilet:  Touching or steadying assistance (Pt > 75%) Assist level from toilet: Touching or steadying assistance (Pt > 75%)      Car transfer   Car transfer assistive device: Publishing rights manager transfer assist level: Touching or steadying assistance (Pt > 75%, lift 1 leg)    Locomotion Ambulation   Assistive  device: Walker-rolling Max distance: 130 Assist level: Touching or steadying assistance (Pt > 75%)  Walk 10 feet activity   Assist level: Touching or steadying assistance (Pt > 75%)  Walk 50 feet with 2 turns activity   Assist level: Touching or steadying assistance (Pt > 75%)  Walk 150 feet activity Walk 150 feet activity did not occur: Refused (pt states he can't walk any further, that his legs are "give out")    Walk 10 feet on uneven surfaces activity   Assist level: Touching or steadying assistance (Pt > 75%) (in apartment)  Stairs   Stairs assistive device: 2 hand rails Max number of stairs: 16 Stairs assist level: Touching or steadying assistance (Pt > 75%)  Walk up/down 1 step activity     Walk up/down 1 step (curb) assist level: Touching or steadying assistance (Pt > 75%)  Walk up/down 4 steps activity   Walk up/down 4 steps assist level: Touching or steadying assistance (Pt > 75%)  Walk up/down 12 steps activity   Walk up/down 12 steps assist level: Touching or steadying assistance (Pt > 75%)  Pick up small objects from floor Pick up small object from the floor (from standing position) activity did not occur: Safety/medical concerns (due to body habitus and safety concerns with balance)    Wheelchair   Type: Manual Max wheelchair distance: 125 Assist Level: No help, No cues, assistive device, takes more than reasonable amount of time  Wheel 50 feet with 2 turns activity   Assist Level: No help, No cues, assistive device, takes more than reasonable amount of time  Wheel 150 feet activity         Therapy/Group: Individual Therapy  [place]io Devera E [place] 06/17/2015, 11:26 AM

## 2015-06-17 NOTE — Progress Notes (Signed)
Speech Language Pathology Daily Session Note  Patient Details  Name: [name] MRN: [id number] Date of Birth: 19-Jun-1946  Today's Date: 06/17/2015 SLP Individual Time: 1000-1030 SLP Individual Time Calculation (min): 30 min  Short Term Goals: Week 1: SLP Short Term Goal 1 (Week 1): Patient will sustain attention to basic functional tasks for 45-60 seconds with Mod verbal cues for redirection  SLP Short Term Goal 2 (Week 1): Patient will recall and verbally identify 2 cognitive and 2 physical deficits with Mod question cues SLP Short Term Goal 3 (Week 1): Patient will demonstrate consistent use (over 3 consecutive sessions) of external aids for re-orientation with Min question cues  SLP Short Term Goal 4 (Week 1): Patient will demonstrate basic problem solving during completion of functional tasks with Mod question cues  SLP Short Term Goal 5 (Week 1): Patient will maintain topic of conversation for 3-4 turns in 70% of opportunities with Mod verbal cues for redirection  SLP Short Term Goal 6 (Week 1): Patient will utilize description and or gestures to express basic information during moments of anomia with Mod vebral cues  Skilled Therapeutic Interventions: Skilled treatment session focused on cognitive goals. SLP facilitated session by administering the MoCA-Blind. Patient scored 14/22 points with deficits with immediate and short-term recall. Patient recalled events from morning therapy session with Min A question cues and maintained topic of conversation with supervision verbal cues for redirection. Patient was also Mod I for orientation to place, time and situation and was Mod I for word-finding. Patient left upright in wheelchair with quick release belt in place and all needs within reach. Continue with current plan of care.    Function:  Cognition Comprehension Comprehension assist level: Understands basic 90% of the time/cues < 10% of the time  Expression   Expression assist  level: Expresses basic needs/ideas: With no assist  Social Interaction Social Interaction assist level: Interacts appropriately 75 - 89% of the time - Needs redirection for appropriate language or to initiate interaction.  Problem Solving Problem solving assist level: Solves basic 75 - 89% of the time/requires cueing 10 - 24% of the time  Memory Memory assist level: Recognizes or recalls 75 - 89% of the time/requires cueing 10 - 24% of the time    Pain Pain Assessment Pain Assessment: No/denies pain  Therapy/Group: Individual Therapy  Kambrie Edd[place] 06/17/2015, 2:15 PM

## 2015-06-17 NOTE — Progress Notes (Signed)
Speech Language Pathology Daily Session Note  Patient Details  Name: [name] MRN: [id number] Date of Birth: 10-15-1945  Today's Date: 06/17/2015 SLP Group Time: 7017-7939 SLP Group Time Calculation (min): 30 min  Short Term Goals: Week 1: SLP Short Term Goal 1 (Week 1): Patient will sustain attention to basic functional tasks for 45-60 seconds with Mod verbal cues for redirection  SLP Short Term Goal 2 (Week 1): Patient will recall and verbally identify 2 cognitive and 2 physical deficits with Mod question cues SLP Short Term Goal 3 (Week 1): Patient will demonstrate consistent use (over 3 consecutive sessions) of external aids for re-orientation with Min question cues  SLP Short Term Goal 4 (Week 1): Patient will demonstrate basic problem solving during completion of functional tasks with Mod question cues  SLP Short Term Goal 5 (Week 1): Patient will maintain topic of conversation for 3-4 turns in 70% of opportunities with Mod verbal cues for redirection  SLP Short Term Goal 6 (Week 1): Patient will utilize description and or gestures to express basic information during moments of anomia with Mod vebral cues  Skilled Therapeutic Interventions:  Skilled treatment session focused on addressing cognitive goals in a group treatment session. SLP facilitated session by providing instructions for a new learning,  basic problem solving task.  Patient was able to sustain attention to task and people's turns with Supervision level question cues for approximately 20 minutes.  Patient also required Supervision level verbal cues to recall rules of the task, which he was then able to problem solve Mod I.  Patient with significant improvements in function as compared to day of evaluation and no moments of anomia were observed.  Continue with current plan of care.   Function:  Cognition Comprehension Comprehension assist level: Understands basic 90% of the time/cues < 10% of the time  Expression    Expression assist level: Expresses basic needs/ideas: With no assist  Social Interaction Social Interaction assist level: Interacts appropriately 75 - 89% of the time - Needs redirection for appropriate language or to initiate interaction.  Problem Solving Problem solving assist level: Solves basic 75 - 89% of the time/requires cueing 10 - 24% of the time  Memory Memory assist level: Recognizes or recalls 75 - 89% of the time/requires cueing 10 - 24% of the time    Pain Pain Assessment Pain Assessment: No/denies pain  Therapy/Group: Group Therapy  Carmelia Roller., CCC-SLP [phone]  [place] 06/17/2015, 4:40 PM

## 2015-06-18 ENCOUNTER — Ambulatory Visit (HOSPITAL_COMMUNITY): Payer: Medicare Other | Admitting: Physical Therapy

## 2015-06-18 ENCOUNTER — Encounter (HOSPITAL_COMMUNITY): Payer: Medicare Other | Admitting: Occupational Therapy

## 2015-06-18 ENCOUNTER — Encounter (HOSPITAL_COMMUNITY): Payer: Medicare Other | Admitting: Speech Pathology

## 2015-06-18 LAB — GLUCOSE, CAPILLARY
GLUCOSE-CAPILLARY: 110 mg/dL — AB (ref 65–99)
Glucose-Capillary: 132 mg/dL — ABNORMAL HIGH (ref 65–99)

## 2015-06-18 MED ORDER — OXYCODONE HCL 5 MG PO TABS: 5.0000 mg | ORAL_TABLET | Freq: Four times a day (QID) | ORAL | Status: DC | PRN

## 2015-06-18 MED ORDER — GLIMEPIRIDE 1 MG PO TABS: 1.0000 mg | ORAL_TABLET | Freq: Every day | ORAL | Status: DC

## 2015-06-18 MED ORDER — IPRATROPIUM-ALBUTEROL 18-103 MCG/ACT IN AERO: 2.0000 | INHALATION_SPRAY | Freq: Two times a day (BID) | RESPIRATORY_TRACT | Status: DC

## 2015-06-18 MED ORDER — CARVEDILOL 3.125 MG PO TABS: 3.1250 mg | ORAL_TABLET | Freq: Two times a day (BID) | ORAL | Status: DC

## 2015-06-18 MED ORDER — FLUOXETINE HCL 20 MG PO CAPS: 20.0000 mg | ORAL_CAPSULE | Freq: Every day | ORAL | Status: DC

## 2015-06-18 MED ORDER — SENNOSIDES-DOCUSATE SODIUM 8.6-50 MG PO TABS: 1.0000 | ORAL_TABLET | Freq: Every day | ORAL | Status: DC

## 2015-06-18 MED ORDER — LOSARTAN POTASSIUM 25 MG PO TABS: 25.0000 mg | ORAL_TABLET | Freq: Every day | ORAL | Status: DC

## 2015-06-18 MED ORDER — POTASSIUM CHLORIDE CRYS ER 20 MEQ PO TBCR: 20.0000 meq | EXTENDED_RELEASE_TABLET | Freq: Two times a day (BID) | ORAL | Status: DC

## 2015-06-18 NOTE — Progress Notes (Signed)
Patient discharged home.  Left floor via wheelchair, escorted by nursing staff and family.  All patient belongings sent with patient.  Appears to be in no immediate distress at this time.  Patient and family verbalized understanding of discharge instructions as given by Algis Liming, PA.  Brita Romp, RN

## 2015-06-18 NOTE — Progress Notes (Signed)
Physical Therapy Session Note  Patient Details  Name: [name] MRN: [id number] Date of Birth: [birth year]  Short Term Goals: Week 1:  PT Short Term Goal 1 (Week 1): Pt will perform bed mobility with mod A PT Short Term Goal 2 (Week 1): Pt will perform transfers with mod A from a variety of surfaces PT Short Term Goal 3 (Week 1): Pt will amb 100' with min A and RW PT Short Term Goal 4 (Week 1): Pt will propel w/c 150' with supervision PT Short Term Goal 5 (Week 1): Pt will negotiate 4 steps with 2 hands rails and mod A  Skilled Therapeutic Interventions/Progress Updates:    Pt scheduled for family education with OT/PT/SLP from 1030-1200, however, no family present as of 1120.  Provided patient with rehab discharge recommendation sheet with instructions for family.  Pt focused on finding his cell phone charger and not agreeable to work with PT for 30 minutes this morning.    See Function Navigator for Current Functional Status.    Aronda Burford E Penven-Crew 06/18/2015, 12:16 PM

## 2015-06-18 NOTE — Discharge Instructions (Signed)
Inpatient Rehab Discharge Instructions  [name] Discharge date and time: 06/18/15   Activities/Precautions/ Functional Status: Activity: no lifting, driving, or strenuous exercise till cleared by MD Diet: cardiac diet and diabetic diet Low salt Wound Care: Cleanse area with soap and water. Pat dry. Apply damp to dry dressing twice a day.  Functional status:  ___ No restrictions     ___ Walk up steps independently _X__ 24/7 supervision/assistance   ___ Walk up steps with assistance ___ Intermittent supervision/assistance  ___ Bathe/dress independently ___ Walk with walker     ___ Bathe/dress with assistance ___ Walk Independently    ___ Shower independently ___ Walk with assistance    _X__ Shower with assistance _X__ No alcohol     ___ Return to work/school ________    COMMUNITY REFERRALS UPON DISCHARGE:    Home Health:   PT     ST   RN                       Agency: [facility] Phone:  [phone]     Special Instructions: 1. Continue to use oxygen at nights.  2. Check blood sugars before meals and a bedtime till follow up with Dr. Noberto Retort. Call Dr. Noberto Retort for input if blood sugars start running over 150.  3. Weight yourself daily.    Heart Failure Heart failure is a condition in which the heart has trouble pumping blood. This means your heart does not pump blood efficiently for your body to work well. In some cases of heart failure, fluid may back up into your lungs or you may have swelling (edema) in your lower legs. Heart failure is usually a long-term (chronic) condition. It is important for you to take good care of yourself and follow your health care provider's treatment plan. CAUSES  Some health conditions can cause heart failure. Those health conditions include:  High blood pressure (hypertension). Hypertension causes the heart muscle to work harder than normal. When pressure in the blood vessels is high, the heart needs to pump (contract) with more  force in order to circulate blood throughout the body. High blood pressure eventually causes the heart to become stiff and weak.  Coronary artery disease (CAD). CAD is the buildup of cholesterol and fat (plaque) in the arteries of the heart. The blockage in the arteries deprives the heart muscle of oxygen and blood. This can cause chest pain and may lead to a heart attack. High blood pressure can also contribute to CAD.  Heart attack (myocardial infarction). A heart attack occurs when one or more arteries in the heart become blocked. The loss of oxygen damages the muscle tissue of the heart. When this happens, part of the heart muscle dies. The injured tissue does not contract as well and weakens the heart's ability to pump blood.  Abnormal heart valves. When the heart valves do not open and close properly, it can cause heart failure. This makes the heart muscle pump harder to keep the blood flowing.  Heart muscle disease (cardiomyopathy or myocarditis). Heart muscle disease is damage to the heart muscle from a variety of causes. These can include drug or alcohol abuse, infections, or unknown reasons. These can increase the risk of heart failure.  Lung disease. Lung disease makes the heart work harder because the lungs do not work properly. This can cause a strain on the heart, leading it to fail.  Diabetes. Diabetes increases the risk of heart failure. High blood sugar  contributes to high fat (lipid) levels in the blood. Diabetes can also cause slow damage to tiny blood vessels that carry important nutrients to the heart muscle. When the heart does not get enough oxygen and food, it can cause the heart to become weak and stiff. This leads to a heart that does not contract efficiently.  Other conditions can contribute to heart failure. These include abnormal heart rhythms, thyroid problems, and low blood counts (anemia). Certain unhealthy behaviors can increase the risk of heart failure,  including:  Being overweight.  Smoking or chewing tobacco.  Eating foods high in fat and cholesterol.  Abusing illicit drugs or alcohol.  Lacking physical activity. SYMPTOMS  Heart failure symptoms may vary and can be hard to detect. Symptoms may include:  Shortness of breath with activity, such as climbing stairs.  Persistent cough.  Swelling of the feet, ankles, legs, or abdomen.  Unexplained weight gain.  Difficulty breathing when lying flat (orthopnea).  Waking from sleep because of the need to sit up and get more air.  Rapid heartbeat.  Fatigue and loss of energy.  Feeling light-headed, dizzy, or close to fainting.  Loss of appetite.  Nausea.  Increased urination during the night (nocturia). DIAGNOSIS  A diagnosis of heart failure is based on your history, symptoms, physical examination, and diagnostic tests. Diagnostic tests for heart failure may include:  Echocardiography.  Electrocardiography.  Chest X-ray.  Blood tests.  Exercise stress test.  Cardiac angiography.  Radionuclide scans. TREATMENT  Treatment is aimed at managing the symptoms of heart failure. Medicines, behavioral changes, or surgical intervention may be necessary to treat heart failure.  Medicines to help treat heart failure may include:  Angiotensin-converting enzyme (ACE) inhibitors. This type of medicine blocks the effects of a blood protein called angiotensin-converting enzyme. ACE inhibitors relax (dilate) the blood vessels and help lower blood pressure.  Angiotensin receptor blockers (ARBs). This type of medicine blocks the actions of a blood protein called angiotensin. Angiotensin receptor blockers dilate the blood vessels and help lower blood pressure.  Water pills (diuretics). Diuretics cause the kidneys to remove salt and water from the blood. The extra fluid is removed through urination. This loss of extra fluid lowers the volume of blood the heart pumps.  Beta  blockers. These prevent the heart from beating too fast and improve heart muscle strength.  Digitalis. This increases the force of the heartbeat.  Healthy behavior changes include:  Obtaining and maintaining a healthy weight.  Stopping smoking or chewing tobacco.  Eating heart-healthy foods.  Limiting or avoiding alcohol.  Stopping illicit drug use.  Physical activity as directed by your health care provider.  Surgical treatment for heart failure may include:  A procedure to open blocked arteries, repair damaged heart valves, or remove damaged heart muscle tissue.  A pacemaker to improve heart muscle function and control certain abnormal heart rhythms.  An internal cardioverter defibrillator to treat certain serious abnormal heart rhythms.  A left ventricular assist device (LVAD) to assist the pumping ability of the heart. HOME CARE INSTRUCTIONS   Take medicines only as directed by your health care provider. Medicines are important in reducing the workload of your heart, slowing the progression of heart failure, and improving your symptoms.  Do not stop taking your medicine unless directed by your health care provider.  Do not skip any dose of medicine.  Refill your prescriptions before you run out of medicine. Your medicines are needed every day.  Engage in moderate physical activity if directed  by your health care provider. Moderate physical activity can benefit some people. The elderly and people with severe heart failure should consult with a health care provider for physical activity recommendations.  Eat heart-healthy foods. Food choices should be free of trans fat and low in saturated fat, cholesterol, and salt (sodium). Healthy choices include fresh or frozen fruits and vegetables, fish, lean meats, legumes, fat-free or low-fat dairy products, and whole grain or high fiber foods. Talk to a dietitian to learn more about heart-healthy foods.  Limit sodium if directed by  your health care provider. Sodium restriction may reduce symptoms of heart failure in some people. Talk to a dietitian to learn more about heart-healthy seasonings.  Use healthy cooking methods. Healthy cooking methods include roasting, grilling, broiling, baking, poaching, steaming, or stir-frying. Talk to a dietitian to learn more about healthy cooking methods.  Limit fluids if directed by your health care provider. Fluid restriction may reduce symptoms of heart failure in some people.  Weigh yourself every day. Daily weights are important in the early recognition of excess fluid. You should weigh yourself every morning after you urinate and before you eat breakfast. Wear the same amount of clothing each time you weigh yourself. Record your daily weight. Provide your health care provider with your weight record.  Monitor and record your blood pressure if directed by your health care provider.  Check your pulse if directed by your health care provider.  Lose weight if directed by your health care provider. Weight loss may reduce symptoms of heart failure in some people.  Stop smoking or chewing tobacco. Nicotine makes your heart work harder by causing your blood vessels to constrict. Do not use nicotine gum or patches before talking to your health care provider.  Keep all follow-up visits as directed by your health care provider. This is important.  Limit alcohol intake to no more than 1 drink per day for nonpregnant women and 2 drinks per day for men. One drink equals 12 ounces of beer, 5 ounces of wine, or 1 ounces of hard liquor. Drinking more than that is harmful to your heart. Tell your health care provider if you drink alcohol several times a week. Talk with your health care provider about whether alcohol is safe for you. If your heart has already been damaged by alcohol or you have severe heart failure, drinking alcohol should be stopped completely.  Stop illicit drug use.  Stay  up-to-date with immunizations. It is especially important to prevent respiratory infections through current pneumococcal and influenza immunizations.  Manage other health conditions such as hypertension, diabetes, thyroid disease, or abnormal heart rhythms as directed by your health care provider.  Learn to manage stress.  Plan rest periods when fatigued.  Learn strategies to manage high temperatures. If the weather is extremely hot:  Avoid vigorous physical activity.  Use air conditioning or fans or seek a cooler location.  Avoid caffeine and alcohol.  Wear loose-fitting, lightweight, and light-colored clothing.  Learn strategies to manage cold temperatures. If the weather is extremely cold:  Avoid vigorous physical activity.  Layer clothes.  Wear mittens or gloves, a hat, and a scarf when going outside.  Avoid alcohol.  Obtain ongoing education and support as needed.  Participate in or seek rehabilitation as needed to maintain or improve independence and quality of life. SEEK MEDICAL CARE IF:   Your weight increases by 03 lb/1.4 kg in 1 day or 05 lb/2.3 kg in a week.  You have increasing shortness  of breath that is unusual for you.  You are unable to participate in your usual physical activities.  You tire easily.  You cough more than normal, especially with physical activity.  You have any or more swelling in areas such as your hands, feet, ankles, or abdomen.  You are unable to sleep because it is hard to breathe.  You feel like your heart is beating fast (palpitations).  You become dizzy or light-headed upon standing up. SEEK IMMEDIATE MEDICAL CARE IF:   You have difficulty breathing.  There is a change in mental status such as decreased alertness or difficulty with concentration.  You have a pain or discomfort in your chest.  You have an episode of fainting (syncope). MAKE SURE YOU:   Understand these instructions.  Will watch your  condition.  Will get help right away if you are not doing well or get worse. Document Released: 09/27/2005 Document Revised: 02/11/2014 Document Reviewed: 10/27/2012 [facility] Patient Information 2015 [place], [place]. This information is not intended to replace advice given to you by your health care provider. Make sure you discuss any questions you have with your health care provider. My questions have been answered and I understand these instructions. I will adhere to these goals and the provided educational materials after my discharge from the hospital.  Patient/Caregiver Signature _______________________________ Date __________  Clinician Signature _______________________________________ Date __________  Please bring this form and your medication list with you to all your follow-up doctor's appointments.

## 2015-06-18 NOTE — Progress Notes (Signed)
Speech Language Pathology Session Note & Discharge Summary  Patient Details  Name: [name] MRN: [id number] Date of Birth: [birth year]  Today's Date: 06/18/2015 SLP Individual Time: 1100-1125 SLP Individual Treatment Time: 25 min   Skilled Therapeutic Interventions:  Skilled treatment session focused on patient/family education. However, patient's daughter was not present, therefore, education was completed with the patient. Patient was able to verbalize that he needs to use the walker while ambulating, push up from the wheelchair when standing and need for 24 hour supervision and assistance with medications with supervision question cues. Handouts were also given to reinforce information for both the patient and his daughter. Patient left sitting EOB with bed alarm on and all needs within reach.   Patient has met 6 of 6 long term goals.  Patient to discharge at [facility] level.   Reasons goals not met: N/A   Clinical Impression/Discharge Summary: Patient has made functional gains and has met 6 out of 6 LTG's this admission due to improved cognitive function. Currently, patient requires overall Min A multimodal cues to perform functional and familiar tasks safely in regards to sustained attention, recall, problem solving and awareness. Patient is also overall Mod I for word-finding at the conversation level. Patient education is complete and patient will discharge home with 24 hour supervision from family. Patient would benefit from f/u home health SLP services to maximize his overall cognitive function and reduce caregiver burden.   Care Partner:  Caregiver Able to Provide Assistance: Yes  Type of Caregiver Assistance: Physical;Cognitive  Recommendation:  24 hour supervision/assistance;Home Health SLP  Rationale for SLP Follow Up: Maximize cognitive function and independence;Reduce caregiver burden   Equipment: N/A   Reasons for discharge: Treatment goals met;Discharged from  hospital   Patient/Family Agrees with Progress Made and Goals Achieved: Yes   Function:  Cognition Comprehension Comprehension assist level: Understands complex 90% of the time/cues 10% of the time  Expression   Expression assist level: Expresses basic needs/ideas: With no assist  Social Interaction Social Interaction assist level: Interacts appropriately 90% of the time - Needs monitoring or encouragement for participation or interaction.  Problem Solving Problem solving assist level: Solves basic 75 - 89% of the time/requires cueing 10 - 24% of the time  Memory Memory assist level: Recognizes or recalls 75 - 89% of the time/requires cueing 10 - 24% of the time   Ruthetta Koopmann 06/18/2015, 4:56 PM

## 2015-06-18 NOTE — Progress Notes (Addendum)
[place] PHYSICAL MEDICINE & REHABILITATION     PROGRESS NOTE    Subjective/Complaints:feeling well. Ready to go home. States pain is under reasonable control.   ROS: Pt denies fever, rash/itching, headache, blurred or double vision, nausea, vomiting, abdominal pain, diarrhea, chest pain, shortness of breath, palpitations, dysuria, dizziness, bleeding, anxiety, or depression   Objective: Vital Signs: Blood pressure 115/59, pulse 94, temperature 97.7 F (36.5 C), temperature source Oral, resp. rate 18, height 5' 10"  (1.778 m), weight 110.542 kg (243 lb 11.2 oz), SpO2 96 %. No results found. No results for input(s): WBC, HGB, HCT, PLT in the last 72 hours.  Recent Labs  06/15/15 1822 06/17/15 0754  NA 139 139  K 4.1 4.1  CL 98* 98*  GLUCOSE 142* 148*  BUN 24* 20  CREATININE 0.97 0.94  CALCIUM 8.5* 8.7*   CBG (last 3)   Recent Labs  06/17/15 1659 06/17/15 2222 06/18/15 0639  GLUCAP 128* 115* 132*    Wt Readings from Last 3 Encounters:  06/18/15 110.542 kg (243 lb 11.2 oz)  06/05/15 120.3 kg (265 lb 3.4 oz)  05/29/15 106.641 kg (235 lb 1.6 oz)    Physical Exam:  Constitutional: He is oriented to person, place, and time. He appears well-developed and well-nourished.  HENT:  Head: Normocephalic and atraumatic.  Eyes: Conjunctivae are normal. Pupils are equal, round, and reactive to light.  Neck: Normal range of motion. Neck supple. No tracheal deviation present. No thyromegaly present.  Cardiovascular: Normal rate. An irregular rhythm present.  Respiratory: Effort normal. No respiratory distress. He has decreased breath sounds in the bases. He has no wheezes. He has no rales. He exhibits no tenderness.  GI: Soft. Bowel sounds are normal. He exhibits no distension. There is mild tenderness.  Abdominal wound continues to be clean with beefy red granulation tissue, central area still with decreased necrotic tissue Ext: He exhibits edema tr to 1+, chronic vascular  changes in the legs.   Bruises on bilateral knees with dry abrasion left knee. Both knees generally tender to touch. Also with low back tenderness BLE with vascular changes below the knees to the ankles. 1+ pedal/ankle edema.  Neurological: He is alert and oriented to person, place, and time.  Speech clear.  Alert. Able to follow simple one step commands.   CN exam without focal findings. poor insight and awareness. Oriented to hospital. Attention span 10 seconds or less. UE: 3+ deltoid, bicep, tricep, wrist, 4HI. LE: 2+HF, 3-KE and 4- ADF,APF. Decreased LT in both feet and ankles.  Skin: Skin is warm and dry.  Psychiatric: His speech is normal.  .  Generally cooperative      Assessment/Plan: 1. Functional deficits secondary to encephalopathy/debility which require 3+ hours per day of interdisciplinary therapy in a comprehensive inpatient rehab setting. Physiatrist is providing close team supervision and 24 hour management of active medical problems listed below. Physiatrist and rehab team continue to assess barriers to discharge/monitor patient progress toward functional and medical goals.  Function:  Bathing Bathing position   Position: Other (comment) (sitting on BSC)  Bathing parts Body parts bathed by patient: Right arm, Left arm, Chest, Right upper leg, Left upper leg Body parts bathed by helper: Left lower leg, Right lower leg, Front perineal area, Buttocks  Bathing assist Assist Level: Supervision or verbal cues      Upper Body Dressing/Undressing Upper body dressing   What is the patient wearing?: Pull over shirt/dress     Pull over shirt/dress - Perfomed  by patient: Thread/unthread right sleeve, Thread/unthread left sleeve, Put head through opening, Pull shirt over trunk Pull over shirt/dress - Perfomed by helper: Pull shirt over trunk        Upper body assist Assist Level: Set up      Lower Body Dressing/Undressing Lower body dressing   What is the patient  wearing?: Underwear, Pants, Socks, Shoes Underwear - Performed by patient: Pull underwear up/down Underwear - Performed by helper: Thread/unthread right underwear leg, Thread/unthread left underwear leg Pants- Performed by patient: Pull pants up/down Pants- Performed by helper: Thread/unthread left pants leg, Thread/unthread right pants leg   Non-skid slipper socks- Performed by helper: Don/doff left sock, Don/doff right sock   Socks - Performed by helper: Don/doff right sock, Don/doff left sock   Shoes - Performed by helper: Don/doff right shoe, Don/doff left shoe, Fasten right, Fasten left          Lower body assist Assist Level: Touching or steadying assistance (Pt > 75%)      Toileting Toileting   Toileting steps completed by patient: Adjust clothing prior to toileting, Adjust clothing after toileting Toileting steps completed by helper: Performs perineal hygiene Toileting Assistive Devices: Grab bar or rail  Toileting assist Assist level: Touching or steadying assistance (Pt.75%)   Transfers Chair/bed transfer   Chair/bed transfer method: Ambulatory Chair/bed transfer assist level: No Help, no cues, assistive device, takes more than a reasonable amount of time Chair/bed transfer assistive device: Medical sales representative     Max distance: 130 Assist level: Touching or steadying assistance (Pt > 75%)   Wheelchair   Type: Manual Max wheelchair distance: 125 Assist Level: No help, No cues, assistive device, takes more than reasonable amount of time  Cognition Comprehension Comprehension assist level: Understands complex 90% of the time/cues 10% of the time  Expression Expression assist level: Expresses basic needs/ideas: With no assist  Social Interaction Social Interaction assist level: Interacts appropriately 75 - 89% of the time - Needs redirection for appropriate language or to initiate interaction.  Problem Solving Problem solving assist level: Solves  basic 75 - 89% of the time/requires cueing 10 - 24% of the time  Memory Memory assist level: Recognizes or recalls 75 - 89% of the time/requires cueing 10 - 24% of the time   Medical Problem List and Plan: 1. 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 Goals met 2. DVT Prophylaxis/Anticoagulation: Pharmaceutical: Lovenox is indicated due to immobiltiy 3. Chronic back pain/Pain Management: prn oxycodone  -limiting neurosedating meds as possible 4. Mood: LCSW to follow up with patient for evaluation and support.  5. Neuropsych: This patient is   capable of making decisions on his own behalf   -cognition improved probably close to baseline 6. Skin/Wound Care: Daily dressing changes. Wound looks great 7. Fluids/Electrolytes/Nutrition: Monitor I/O. potassium normal. Continue supplement 8. COPD:   Oxygen at nights, prn during the day. Flovent bid with rescue inhaler prn. Encourage OOB 9. A fib: On coreg bid. HR is controlled at present. 10 H/o Gout: Continue allopurinol and colchicine.  11. DM type 2: added amaryl with improvement 12. H/o depression: On Prozac and Effexor.provide ego support 13. Chronic diastolic CHF: weight still at 111.6kg, 3L down---close to baseline  -continue Low salt diet.    -appreciate cardiology assistance/diuresis  -outpt follow up 14. Leucocytosis: Monitor for signs of infection. CBC stable.  15. ABLA: Encourage intake. hgb 10.8 yesterday---labs personally reviewed  -stool heme  neg 16. Encephalopathy: H/o alcohol abuse.  Discussed cessation 18. Constipation: had bm over weekend  -senna  LOS (Days) 9 A FACE TO FACE EVALUATION WAS PERFORMED  Raizel Wesolowski T 06/18/2015 8:04 AM

## 2015-06-18 NOTE — Progress Notes (Signed)
Occupational Therapy Note  Patient Details  Name: [name] MRN: [id number] Date of Birth: 04/10/1946  Pt's daughter scheduled for family education from 1030-1100. Pt's daughter had not arrived at 52.  Provided pt with discharge rehab recommendation sheet.   Simms 06/18/2015, 8:30 AM

## 2015-06-18 NOTE — Progress Notes (Signed)
Social Work Patient ID: Sharion Balloon, male   DOB: 03-08-1946, 69 y.o.   MRN: [id number]   Lowella Curb, LCSW Social Worker Signed  Patient Care Conference 06/18/2015 11:23 AM    Expand All Collapse All   Inpatient RehabilitationTeam Conference and Plan of Care Update Date: 06/17/2015   Time: 2:40 PM     Patient Name: [name]       Medical Record Number: [id number]  Date of Birth: Oct 21, 1945 Sex: Male         Room/Bed: 4M05C/4M05C-01 Payor Info: Payor: MEDICARE / Plan: MEDICARE PART A AND B / Product Type: *No Product type* /    Admitting Diagnosis: Debility, encephalopathy   Admit Date/Time:  06/09/2015  5:44 PM Admission Comments: No comment available   Primary Diagnosis:  Debility Principal Problem: Debility    Patient Active Problem List     Diagnosis  Date Noted   .  SOB (shortness of breath)     .  Acute on chronic diastolic CHF (congestive heart failure), NYHA class 3     .  Encephalopathy, metabolic     .  Hypomagnesemia  05/27/2015   .  History of male breast cancer, s/p R mastectomy Jan 2016  05/27/2015   .  General weakness  05/27/2015   .  Generalized weakness  05/26/2015   .  Hypokalemia  10/03/2014   .  Congestive heart failure  10/02/2014   .  Lower extremity pain, left  10/02/2014   .  Diabetes mellitus type 2, controlled  10/02/2014   .  Enterocutaneous fistula     .  Hypertension  08/30/2014   .  COPD (chronic obstructive pulmonary disease)  08/30/2014   .  Intracerebral hemorrhage  08/01/2014   .  Osteoarthritis of both hands  07/30/2014   .  Thrush, oral  07/28/2014   .  Debility  07/25/2014   .  Diabetic polyneuropathy associated with type 2 diabetes mellitus  07/25/2014   .  Gout flare  07/24/2014   .  Pre-operative cardiovascular examination  07/15/2014   .  Family history of malignant neoplasm of gastrointestinal tract  06/12/2014   .  Panniculitis  04/14/2013   .  Seasonal allergies  12/20/2012   .  L1 vertebral fracture  09/07/2012   .   Hypothyroidism  08/30/2012   .  ETOH abuse     .  PAF (paroxysmal atrial fibrillation)     .  Chronic diastolic heart failure     .  DM (diabetes mellitus) type II uncontrolled, periph vascular disorder  06/20/2009   .  Obstructive sleep apnea  06/20/2009   .  CAROTID STENOSIS  06/20/2009   .  CHRONIC OBSTRUCTIVE PULMONARY DISEASE  06/20/2009   .  Coronary atherosclerosis of native coronary artery  06/20/2009   .  Hypderlipidemia     .  Hypertensive heart disease       Expected Discharge Date: Expected Discharge Date: 06/18/15  Team Members Present: Physician leading conference: Dr. Alger Simons Social Worker Present: Lennart Pall, LCSW Nurse Present: Other (comment) Verdis Frederickson, RN [facility])) PT Present: Carney Living, PT;Caitlin Penven-Crew, PT OT Present: Roanna Epley, COTA;Jennifer Tamala Julian, OT SLP Present: Weston Anna, SLP Other (Discipline and Name): Danne Baxter, RN [facility]) PPS Coordinator present : Daiva Nakayama, RN, [facility]        Current Status/Progress  Goal  Weekly Team Focus   Medical     mild diuresis. cognition improving. ongoing wound  issues. appetite better  finalize for discharge  skin,nutrition, cognition, cv   Bowel/Bladder     Continent of bowel and bladder; LBM 9/5   Min assist  Maintain current regimen; assess and treat for constipation   Swallow/Nutrition/ Hydration               ADL's     Goals met, ready for discharge  min A with dynamic balance and toileting for clothing management, supervision toilet transfers, mod A bathing and LB dressing   Family education   Mobility     mod I w/c and bed mobility, min/mod transfers, steady A gait  min A all mobility except supervision w/c  sit<>stand, amb, activity tolerance    Communication     Min A  Min A  Family Education    Safety/Cognition/ Behavioral Observations    Min A   Min A  Family Education    Pain     C/o pain in legs and back; oxycodone 5 mg po prn q4h  < 4  Assess and treat for pain q shift and  prn   Skin     Wet to dry packing to abdominal wound BID; minimal greenish drainage; wound beefy red; abrasion to knee OTA  Patients skin will remain free from further breakdown or infection with min assist.  Assess skin q shift and prn; change dressings per MD order     Rehab Goals Patient on target to meet rehab goals: Yes *See Care Plan and progress notes for long and short-term goals.    Barriers to Discharge:  wond care, premorbid inactivity     Possible Resolutions to Barriers:   family supervision, education     Discharge Planning/Teaching Needs:   home with family to provide 24/7 assistance        Team Discussion:    No concerns.  Goals met and ready for d/c tomorrow.   Revisions to Treatment Plan:    None    Continued Need for Acute Rehabilitation Level of Care: The patient requires daily medical management by a physician with specialized training in physical medicine and rehabilitation for the following conditions: Daily direction of a multidisciplinary physical rehabilitation program to ensure safe treatment while eliciting the highest outcome that is of practical value to the patient.: Yes Daily medical management of patient stability for increased activity during participation in an intensive rehabilitation regime.: Yes Daily analysis of laboratory values and/or radiology reports with any subsequent need for medication adjustment of medical intervention for : Neurological problems;Post surgical problems  Floella Ensz 06/18/2015, 11:23 AM                 Lowella Curb, LCSW Social Worker Signed  Patient Care Conference 06/11/2015  3:58 PM    Expand All Collapse All   Inpatient RehabilitationTeam Conference and Plan of Care Update Date: 06/10/2015   Time: 2:45 PM     Patient Name: [name]       Medical Record Number: [id number]  Date of Birth: Dec 24, 1945 Sex: Male         Room/Bed: 4M05C/4M05C-01 Payor Info: Payor: MEDICARE / Plan: MEDICARE PART A AND B /  Product Type: *No Product type* /    Admitting Diagnosis: Debility, encephalopathy   Admit Date/Time:  06/09/2015  5:44 PM Admission Comments: No comment available   Primary Diagnosis:  Debility Principal Problem: Debility    Patient Active Problem List     Diagnosis  Date Noted   .  Encephalopathy, metabolic     .  Hypomagnesemia  05/27/2015   .  History of male breast cancer, s/p R mastectomy Jan 2016  05/27/2015   .  General weakness  05/27/2015   .  Generalized weakness  05/26/2015   .  Hypokalemia  10/03/2014   .  Congestive heart failure  10/02/2014   .  Lower extremity pain, left  10/02/2014   .  Diabetes mellitus type 2, controlled  10/02/2014   .  Enterocutaneous fistula     .  Hypertension  08/30/2014   .  COPD (chronic obstructive pulmonary disease)  08/30/2014   .  Intracerebral hemorrhage  08/01/2014   .  Osteoarthritis of both hands  07/30/2014   .  Thrush, oral  07/28/2014   .  Debility  07/25/2014   .  Diabetic polyneuropathy associated with type 2 diabetes mellitus  07/25/2014   .  Gout flare  07/24/2014   .  Pre-operative cardiovascular examination  07/15/2014   .  Family history of malignant neoplasm of gastrointestinal tract  06/12/2014   .  Panniculitis  04/14/2013   .  Seasonal allergies  12/20/2012   .  L1 vertebral fracture  09/07/2012   .  Hypothyroidism  08/30/2012   .  ETOH abuse     .  PAF (paroxysmal atrial fibrillation)     .  Chronic diastolic heart failure     .  DM (diabetes mellitus) type II uncontrolled, periph vascular disorder  06/20/2009   .  Obstructive sleep apnea  06/20/2009   .  CAROTID STENOSIS  06/20/2009   .  CHRONIC OBSTRUCTIVE PULMONARY DISEASE  06/20/2009   .  Coronary atherosclerosis of native coronary artery  06/20/2009   .  Hypderlipidemia     .  Hypertensive heart disease       Expected Discharge Date: Expected Discharge Date: 06/18/15  Team Members Present: Physician leading conference: Dr. Alger Simons Social  Worker Present: Lennart Pall, LCSW Nurse Present: Heather Roberts, RN PT Present: Raylene Everts, PT;Caitlin Penven-Crew, PT OT Present: Salome Spotted, OT;Roanna Epley, COTA SLP Present: Weston Anna, SLP PPS Coordinator present : Daiva Nakayama, RN, CRRN        Current Status/Progress  Goal  Weekly Team Focus   Medical     debility, encephalopathy after incarcerated bowel and ex lap   improve safety and cognition  wound care, pain mgt, cognitive deficits, nutrition    Bowel/Bladder     Pt continent of bowel and bladder with urgancy episodes of both at times.   manage bowel and bladder minimal assist   encourgae use of toleit and if urinal use encourgae pt to hold urinal and do as much as possible   Swallow/Nutrition/ Hydration               ADL's     max to total  A with LB self care, mod - max A transfers, decreased activity tolerance  min A with dynamic balance and toileting for clothing management, supervision toilet transfers, mod A bathing and LB dressing   ADL retraining, balance, activity tolerance, pt/family education    Mobility     mod/max A bed mobility, transfers, steady A gait, supervision w/c  min A all mobility, supervision w/c   strengthening, activity tolerance, standing balance    Communication     Min-Mod A   Min A  use of word-finding strategies   Safety/Cognition/ Behavioral Observations    Max A  Min A  orientation, problem solving, attention, recall, awareness     Pain     5 mg oxy prn q4hrs. pain to abdomen    4 or less  assess pain and medicate as needed    Skin     wet to dry dressing change to abdomen BID. pink blanchable sacrum EPBC used, abrasion to left knee and BLE. OTA.   free from breakdown min assist. Mod-max assist with dressing chnage  assess skin and educate dressing change     Rehab Goals Patient on target to meet rehab goals: Yes *See Care Plan and progress notes for long and short-term goals.    Barriers to Discharge:  cognition, premorbid deficits      Possible Resolutions to Barriers:   NMR, cognitive perceptual rx, pain mgt, family ed     Discharge Planning/Teaching Needs:   home with family to provide 24/7 assistance        Team Discussion:    New eval;  Cognition, wound care and pain issues.  Hope to avoid wound VAC.  Good first day.  Goals set @ min assist overall (likely the baseline) but min/ mod with OT.     Revisions to Treatment Plan:    None    Continued Need for Acute Rehabilitation Level of Care: The patient requires daily medical management by a physician with specialized training in physical medicine and rehabilitation for the following conditions: Daily direction of a multidisciplinary physical rehabilitation program to ensure safe treatment while eliciting the highest outcome that is of practical value to the patient.: Yes Daily medical management of patient stability for increased activity during participation in an intensive rehabilitation regime.: Yes Daily analysis of laboratory values and/or radiology reports with any subsequent need for medication adjustment of medical intervention for : Neurological problems;Post surgical problems;Other  Jehieli Brassell 06/12/2015, 11:17 AM

## 2015-06-18 NOTE — Progress Notes (Signed)
Patients family educated on proper wound care to abdominal wound.  I demonstrated proper dressing change with moist to dry gauze for packing, abdominal pad and tape.  Patients daughter and family friend verbalized understanding and feel comfortable to perform dressing change after discharge when patient is home.  Brita Romp, RN

## 2015-06-18 NOTE — Discharge Summary (Signed)
Physician Discharge Summary  Patient ID: [name] MRN: [id number] DOB/AGE: 06-11-1946 69 y.o.  Admit date: 06/09/2015 Discharge date: 06/18/2015  Discharge Diagnoses:  Principal Problem:   Debility Active Problems:   Chronic diastolic heart failure   PAF (paroxysmal atrial fibrillation)   Diabetic polyneuropathy associated with type 2 diabetes mellitus   COPD (chronic obstructive pulmonary disease)   Encephalopathy, metabolic   Acute on chronic diastolic CHF (congestive heart failure), NYHA class 3   SOB (shortness of breath)   Discharged Condition: Stable    Labs:  Basic Metabolic Panel:  Recent Labs Lab 06/12/15 1357 06/13/15 0445 06/15/15 1822 06/17/15 0754  NA 135 134* 139 139  K 4.1 4.2 4.1 4.1  CL 96* 93* 98* 98*  CO2 34* 33* 33* 31  GLUCOSE 92 117* 142* 148*  BUN 21* 21* 24* 20  CREATININE 1.02 1.00 0.97 0.94  CALCIUM 8.6* 8.6* 8.5* 8.7*    CBC:  Recent Labs Lab 06/12/15 1357  WBC 7.4  HGB 10.0*  HCT 32.8*  MCV 100.3*  PLT 166    CBG:  Recent Labs Lab 06/17/15 0638 06/17/15 1208 06/17/15 1659 06/17/15 2222 06/18/15 0639  GLUCAP 124* 88 128* 115* 132*    Brief HPI:   [name] is a 69 y.o. 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 he was admitted on 06/02/15 for Exp Lap with small bowel resection by Dr. Ninfa Linden. He was advanced to soft diet with positive BM. He continued to have confusion post op and wound was healing well.  He was noted to be deconditioned and CIR was recommended by MD and rehab team.    Hospital Course: [name] was admitted to rehab 06/09/2015 for inpatient therapies to consist of PT, ST and OT at least three hours five days a week.  After admission physiatrist, therapy team and rehab RN have worked together to provide customized  collaborative inpatient rehab.    Wet to dry dressing changes were done daily and abdominal wound is closing in nicely with small amount of fibro-necrotic tissue at 12 o'clock position.  Respiratory status and endurance has improved and he was weaned down to oxygen at bedtime.  Lasix was increased to home dose of 80 mg bid with weight is down to 110 kg at discharge.  Heart rate is controlled on coreg and no cardiac symptoms reported with increase in activity level.  Blood pressure control has improved and check of lytes shows potassium is stable on daily dose kdur. BNP is down  to  346.5.   ABLA has been monitored and Hgb is up to 10.0. Stool guaiac X 1 was negative and patient/family have been advised on alcohol cessation.  Blood sugars were monitored with ac/hs checks and low dose Amaryl was added with improvement in BS control. He was advised to check BS bid to qid basis and follow up with PMD for input prior to resuming insulin as well as further adjustment of Amaryl dose.  He has made good progress during his rehab stay and requires supervision at discharge. He will continue to receive follow up HHPT, [facility] and HHST by [facility] after discharge.    Rehab course: During patient's stay in rehab weekly team conferences were held to monitor patient's progress, set goals and discuss barriers to discharge. At admission, patient required max multimodal  cues for attention as well as cognitive deficits affecting ability to carry out basic tasks. He required max assist with basic self care tasks as well as mobility.  He has had improvement in activity tolerance, balance, postural control, as well as ability to compensate for deficits.  He is able to bathe with supervision and requires set up to steady assist for dressing. He is able to propel his wheelchair independently. He requires supervision for transfers and to ambulate 61' with RW as well as verbal cues for safe walker use. He is able to express needs  without difficulty. He requires min assist for basic problem solving and memory.  Family did not show up for scheduled family education and have been advised to provide supervision with all activity as well as medication and financial management.     Disposition:  Home  Diet: Cardiac and diabetic restrictions. Low salt diet.   Special Instructions: 1. Continue to use oxygen at nights.  2. Check blood sugars before meals and a bedtime till follow up with Dr. Noberto Retort. Call Dr. Noberto Retort for input if blood sugars start running over 150.  3. Weight yourself daily. 4. NO lifting, driving or strenuous activity till cleared by MD.        Medication List    STOP taking these medications        insulin glargine 100 UNIT/ML injection  Commonly known as:  LANTUS     predniSONE 20 MG tablet  Commonly known as:  DELTASONE      TAKE these medications        albuterol-ipratropium 18-103 MCG/ACT inhaler  Commonly known as:  COMBIVENT  Inhale 2 puffs into the lungs 2 (two) times daily.     allopurinol 300 MG tablet  Commonly known as:  ZYLOPRIM  Take 1 tablet (300 mg total) by mouth daily.     aspirin 325 MG EC tablet  Take 1 tablet (325 mg total) by mouth daily.     atorvastatin 20 MG tablet  Commonly known as:  LIPITOR  Take 1 tablet (20 mg total) by mouth daily.     carvedilol 3.125 MG tablet  Commonly known as:  COREG  Take 1 tablet (3.125 mg total) by mouth 2 (two) times daily with a meal.     colchicine 0.6 MG tablet  Take 0.6 mg by mouth daily.     FLUoxetine 20 MG capsule  Commonly known as:  PROZAC  Take 1 capsule (20 mg total) by mouth daily.     fluticasone 110 MCG/ACT inhaler  Commonly known as:  FLOVENT HFA  Inhale 2 puffs into the lungs 2 (two) times daily.     folic acid 1 MG tablet  Commonly known as:  FOLVITE  Take 1 tablet (1 mg total) by mouth daily.     furosemide 80 MG tablet  Commonly known as:  LASIX  Take 1 tablet (80 mg total) by mouth 2 (two)  times daily.     gabapentin 300 MG capsule  Commonly known as:  NEURONTIN  Take 1 capsule (300 mg total) by mouth 2 (two) times daily.     glimepiride 1 MG tablet  Commonly known as:  AMARYL  Take 1 tablet (1 mg total) by mouth daily with breakfast.     levothyroxine 125 MCG tablet  Commonly known as:  SYNTHROID, LEVOTHROID  Take 1.5 tablets (187 mcg total) by mouth daily before breakfast.     losartan 25 MG tablet  Commonly known as:  COZAAR  Take 1 tablet (25 mg total) by mouth daily.     magnesium oxide 400 (241.3 MG) MG tablet  Commonly known as:  MAG-OX  Take 0.5 tablets (200 mg total) by mouth 2 (two) times daily.     omeprazole 20 MG capsule  Commonly known as:  PRILOSEC  Take 1 capsule (20 mg total) by mouth daily.     oxyCODONE 5 MG immediate release tablet   Commonly known as:  Oxy IR/ROXICODONE  Take 1 tablet (5 mg total) by mouth every 6 (six) hours as needed for moderate pain.     potassium chloride SA 20 MEQ tablet  Commonly known as:  KLOR-CON M20  Take 1 tablet (20 mEq total) by mouth 2 (two) times daily.     senna-docusate 8.6-50 MG per tablet  Commonly known as:  Senokot-S  Take 1 tablet by mouth at bedtime. For constipation     tamoxifen 20 MG tablet  Commonly known as:  NOLVADEX  Take 1 tablet (20 mg total) by mouth daily.     venlafaxine XR 37.5 MG 24 hr capsule  Commonly known as:  EFFEXOR-XR  TAKE ONE CAPSULE BY MOUTH DAILY           Follow-up Information    Follow up with Jene Every, MD On 06/30/2015.   Specialty:  Family Medicine   Why:  @ 11:20 am   Contact information:   [address] [place] [address] [phone]       Follow up with Richardson Dopp, PA-C On 06/25/2015.   Specialties:  Physician Assistant, Radiology, Interventional Cardiology   Why:  11:30 AM - Dr. Camillia Herter PA.   Contact information:   [address] [place] [address] [phone]       Call  [name], MD.   Specialty:  Physical Medicine and Rehabilitation   Why:  As needed   Contact information:   [address], [address] [place] [address] [phone]       Follow up with Harl Bowie, MD. Call today.   Specialty:  General Surgery   Why:  for follow up appointment in a week.   Contact information:   [place] [address] [phone]       Signed: Bary Leriche 06/18/2015, 9:59 AM

## 2015-06-18 NOTE — Patient Care Conference (Signed)
Inpatient RehabilitationTeam Conference and Plan of Care Update Date: 06/17/2015   Time: 2:40 PM    Patient Name: [name]      Medical Record Number: [id number]  Date of Birth: [birth year] Sex: Male         Room/Bed: 4M05C/4M05C-01 Payor Info: Payor: MEDICARE / Plan: MEDICARE PART A AND B / Product Type: *No Product type* /    Admitting Diagnosis: Debility, encephalopathy   Admit Date/Time:  06/09/2015  5:44 PM Admission Comments: No comment available   Primary Diagnosis:  Debility Principal Problem: Debility  Patient Active Problem List   Diagnosis Date Noted  . SOB (shortness of breath)   . Acute on chronic diastolic CHF (congestive heart failure), NYHA class 3   . Encephalopathy, metabolic   . Hypomagnesemia 05/27/2015  . History of male breast cancer, s/p R mastectomy Jan 2016 05/27/2015  . General weakness 05/27/2015  . Generalized weakness 05/26/2015  . Hypokalemia 10/03/2014  . Congestive heart failure 10/02/2014  . Lower extremity pain, left 10/02/2014  . Diabetes mellitus type 2, controlled 10/02/2014  . Enterocutaneous fistula   . Hypertension 08/30/2014  . COPD (chronic obstructive pulmonary disease) 08/30/2014  . Intracerebral hemorrhage 08/01/2014  . Osteoarthritis of both hands 07/30/2014  . Thrush, oral 07/28/2014  . Debility 07/25/2014  . Diabetic polyneuropathy associated with type 2 diabetes mellitus 07/25/2014  . Gout flare 07/24/2014  . Pre-operative cardiovascular examination 07/15/2014  . Family history of malignant neoplasm of gastrointestinal tract 06/12/2014  . Panniculitis 04/14/2013  . Seasonal allergies 12/20/2012  . L1 vertebral fracture 09/07/2012  . Hypothyroidism 08/30/2012  . ETOH abuse   . PAF (paroxysmal atrial fibrillation)   . Chronic diastolic heart failure   . DM (diabetes mellitus) type II uncontrolled, periph vascular disorder 06/20/2009  . Obstructive sleep apnea 06/20/2009  . CAROTID STENOSIS 06/20/2009  . CHRONIC  OBSTRUCTIVE PULMONARY DISEASE 06/20/2009  . Coronary atherosclerosis of native coronary artery 06/20/2009  . Hypderlipidemia   . Hypertensive heart disease     Expected Discharge Date: Expected Discharge Date: 06/18/15  Team Members Present: Physician leading conference: Dr. Alger Simons Social Worker Present: Lennart Pall, LCSW Nurse Present: Other (comment) Verdis Frederickson, RN [facility])) PT Present: Carney Living, PT;Caitlin Penven-Crew, PT OT Present: Roanna Epley, COTA;Jennifer Tamala Julian, OT SLP Present: Weston Anna, SLP Other (Discipline and Name): Danne Baxter, RN [facility]) PPS Coordinator present : Daiva Nakayama, RN, [facility]     Current Status/Progress Goal Weekly Team Focus  Medical   mild diuresis. cognition improving. ongoing wound issues. appetite better  finalize for discharge  skin,nutrition, cognition, cv   Bowel/Bladder   Continent of bowel and bladder; LBM 9/5  Min assist  Maintain current regimen; assess and treat for constipation   Swallow/Nutrition/ Hydration             ADL's   Goals met, ready for discharge  min A with dynamic balance and toileting for clothing management, supervision toilet transfers, mod A bathing and LB dressing  Family education   Mobility   mod I w/c and bed mobility, min/mod transfers, steady A gait  min A all mobility except supervision w/c  sit<>stand, amb, activity tolerance   Communication   Min A  Min A  Family Education    Safety/Cognition/ Behavioral Observations  Min A   Min A  Family Education    Pain   C/o pain in legs and back; oxycodone 5 mg po prn q4h  < 4  Assess and treat for pain  q shift and prn   Skin   Wet to dry packing to abdominal wound BID; minimal greenish drainage; wound beefy red; abrasion to knee OTA  Patients skin will remain free from further breakdown or infection with min assist.  Assess skin q shift and prn; change dressings per MD order    Rehab Goals Patient on target to meet rehab goals: Yes *See Care Plan  and progress notes for long and short-term goals.  Barriers to Discharge: wond care, premorbid inactivity    Possible Resolutions to Barriers:  family supervision, education    Discharge Planning/Teaching Needs:  home with family to provide 24/7 assistance      Team Discussion:  No concerns.  Goals met and ready for d/c tomorrow.  Revisions to Treatment Plan:  None   Continued Need for Acute Rehabilitation Level of Care: The patient requires daily medical management by a physician with specialized training in physical medicine and rehabilitation for the following conditions: Daily direction of a multidisciplinary physical rehabilitation program to ensure safe treatment while eliciting the highest outcome that is of practical value to the patient.: Yes Daily medical management of patient stability for increased activity during participation in an intensive rehabilitation regime.: Yes Daily analysis of laboratory values and/or radiology reports with any subsequent need for medication adjustment of medical intervention for : Neurological problems;Post surgical problems  Nyrie Sigal 06/18/2015, 11:23 AM

## 2015-06-18 NOTE — Progress Notes (Signed)
Social Work  Discharge Note  The overall goal for the admission was met for:   Discharge location: Yes - return home with daughter and son-in-law to provide 24/7 assist  Length of Stay: Yes - 9 days  Discharge activity level: Yes - minimal assistance overall  Home/community participation: Yes  Services provided included: MD, RD, PT, OT, SLP, RN, TR, Pharmacy and SW  Financial Services: Medicare and Private Insurance: [place]  Follow-up services arranged: Home Health: RN, PT, ST via [facility] and Patient/Family request agency HH: [facility], DME: NA  Comments (or additional information):  Patient/Family verbalized understanding of follow-up arrangements: Yes - however, daughter did not show up for scheduled family education on day of d/c.  Therapists providing written information.  Daughter had agreed yesterday to being here at 10:30 for 1 1/2 of education.  Individual responsible for coordination of the follow-up plan: pt/ dtr  Confirmed correct DME delivered: NA    Sintia Mckissic

## 2015-06-24 NOTE — Progress Notes (Signed)
Cardiology Office Note   Date:  06/25/2015   ID:  HIMMAT ENBERG, DOB 08-Mar-1946, MRN [id number]  PCP:  Jene Every, MD  Cardiologist:  Dr. Lauree Chandler   CHF:  Dr. Loralie Champagne   Chief Complaint  Patient presents with  . Hospitalization Follow-up  . Coronary Artery Disease  . Congestive Heart Failure  . Atrial Fibrillation     History of Present Illness: [name] is a 69 y.o. male with a hx of CAD, status post CABG in 2001, atrial fibrillation, COPD, sleep apnea, CKD stage III, diastolic CHF, carotid stenosis s/p L CEA. Patient is not a Coumadin candidate secondary to history of alcohol abuse. He was admitted 04/2014 with acute on chronic diastolic CHF in the setting of acute on chronic oliguric renal failure. The patient was placed on IV Lasix with worsening creatinine and no increase in urine output. He was felt to have cardiorenal syndrome. He was seen by nephrology and underwent temporary hemodialysis. Creatinine improved back to baseline.  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  Myoview 05/2014 was neg for ischemia.  He has a hx of breast CA and underwent mastectomy.  Last seen by Dr. Lauree Chandler 02/2015.    Admitted 8/15-8/18 with generalized weakness in the setting of metabolic derangement with severe hypokalemia and hypomagnesemia. This was felt to be multifactorial and related to diuretic use as well as alcohol intake. Potassium and magnesium were repleted. Furosemide was originally held but resumed at discharge. He was discharged with home health services.  Admitted 8/22-8/29. 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 He therefore was brought in and underwent exploratory laparotomy with small bowel resection and incisional hernia repair with Dr. Ninfa Linden. He was discharged to inpatient rehabilitation. He developed volume excess  while in inpatient rehabilitation. He was seen by Cardiology and treated with IV Lasix. Echocardiogram was obtained and demonstrated normal LV function, mild aortic stenosis and moderate biatrial enlargement. He diuresed 7+ liters since admission. Of note, losartan added prior to discharge. He was discharged from rehabilitation on 06/18/15.  Returns for FU.  Patient notes increasing LE edema since DC. He is not that active. Arrives in a wheelchair today. He denies significant dyspnea. Sleeps on 2 pillows. This is chronic without change. Denies PND. Denies coughing or wheezing. Denies chest pain. Denies syncope.   Studies: - LHC (11/01): 3 vessel CAD >>> CABG - Nuclear (9/14): Inferolateral, no ischemia, not gated, probable low risk scan >>> med rx - Carotid US (5/15): Bilateral ICA 60-79%, >50% RECA- f/u 3-months - Echo (7/15): EF 50-55%, MAC, moderate to severe LAE, moderate RVH, moderate RAE  - Nuclear (8/15):  Normal, EF 63%  - Carotid US (6/16):  Bilateral ICA 40-59%; > 50% bilat ECA, L VA occluded, FU 1 year  - Echo (06/12/15):  EF 60-65%, no RWMA, mild AS (mean 12 mmHg), MAC, mod LAE, mod RAE   Past Medical History  Diagnosis Date  . CHRONIC OBSTRUCTIVE PULMONARY DISEASE 06/20/2009  . CAROTID STENOSIS 06/20/2009    A. 08/2001 s/p L CEA;  B.   09/14/11 - Carotid U/S - 40-59% bilateral stenosis, left CEA patch angioplasty is patent  . DM 06/20/2009  . CAD 06/20/2009    A.  08/2000 - s/p CABG x 4 - LIMA-LAD, Left Radial-OM, VG-DIAG, VG-RCA;  B. Neg. MV  2010  . HYPERLIPIDEMIA 06/20/2009  . HYPERTENSION 06/20/2009  .  Hypothyroidism   . Low back pain   . Asthma     as child  . Pneumonia   . Persistent atrial fibrillation     Not felt to be coumadin candidate 2/2 ETOH use.  Marland Kitchen History of tobacco abuse     remote - quit 1970  . Morbidly obese   . Chronic diastolic heart failure, NYHA class 3     Followed by CHF Clinic --> Echo 04/2014: EF 42-59%, Diastolic DysFxn - elevated LVEDP &  LAP. Mod Dil LA & RA.  . Falls frequently   . Hx of cardiovascular stress test 05/2014    Lexiscan Myoview (8/15):  No ischemia; EF 63% - Normal Study  . Breast cancer, right breast   . Gout   . OBSTRUCTIVE SLEEP APNEA 06/20/2009    does not use cpap  . Stroke     found on MRI in Oct. 2015.   Marland Kitchen Shortness of breath dyspnea     occasional  . Depression     takes Prozac  . GERD (gastroesophageal reflux disease)     takes Prilosec  . Arthritis   . Neuromuscular disorder     neuropathy in both legs  . Constipation   . ETOH abuse     no alcohol since Oct. 2015  . 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 CHF (congestive heart failure)   . Supplemental oxygen dependent     hs  . Bilateral renal cysts   . Kidney disease     Stage 3    Past Surgical History  Procedure Laterality Date  . Carotid endarterectomy  2002    left  . Coronary artery bypass graft      x 4 - 2001  . Umbilical hernia repair N/A 01/28/2014    Procedure: HERNIA REPAIR UMBILICAL ADULT/INC;  Surgeon: Harl Bowie, MD;  Location: [facility];  Service: General;  Laterality: N/A;  . Laparotomy N/A 01/28/2014    Procedure: EXPLORATORY LAPAROTOMY;  Surgeon: Harl Bowie, MD;  Location: [facility];  Service: General;  Laterality: N/A;  . Bowel resection N/A 01/28/2014    Procedure: SMALL BOWEL RESECTION;  Surgeon: Harl Bowie, MD;  Location: [facility];  Service: General;  Laterality: N/A;  . Hernia repair    . Colonoscopy    . Total mastectomy Right 11/11/2014    Procedure: RIGHT TOTAL MASTECTOMY;  Surgeon: Excell Seltzer, MD;  Location: [facility];  Service: General;  Laterality: Right;  . Cataract extraction w/phaco Left 04/08/2015    Procedure: CATARACT EXTRACTION PHACO AND INTRAOCULAR LENS PLACEMENT ([place]);  Surgeon: Birder Robson, MD;  Location: [facility];  Service: Ophthalmology;  Laterality: Left;  [place] 00:36 AP% 23.7 CDE 8.72 fluid pack [id number]  . Eye  surgery Left     Catarct  . Laparotomy N/A 06/02/2015    Procedure: EXPLORATORY LAPAROTOMY;  Surgeon: Coralie Keens, MD;  Location: [facility];  Service: General;  Laterality: N/A;  . Bowel resection N/A 06/02/2015    Procedure: SMALL BOWEL RESECTION;  Surgeon: Coralie Keens, MD;  Location: [facility];  Service: General;  Laterality: N/A;     Current Outpatient Prescriptions  Medication Sig Dispense Refill  . albuterol-ipratropium (COMBIVENT) 18-103 MCG/ACT inhaler Inhale 2 puffs into the lungs 2 (two) times daily. 1 Inhaler 1  . allopurinol (ZYLOPRIM) 300 MG tablet Take 1 tablet (300 mg total) by mouth daily. 30 tablet 3  . aspirin EC 325  MG EC tablet Take 1 tablet (325 mg total) by mouth daily. 30 tablet 0  . atorvastatin (LIPITOR) 20 MG tablet Take 1 tablet (20 mg total) by mouth daily. 30 tablet 1  . carvedilol (COREG) 3.125 MG tablet Take 1 tablet (3.125 mg total) by mouth 2 (two) times daily with a meal. 60 tablet 1  . colchicine 0.6 MG tablet Take 0.6 mg by mouth daily.    Marland Kitchen FLUoxetine (PROZAC) 20 MG capsule Take 1 capsule (20 mg total) by mouth daily. 30 capsule 1  . fluticasone (FLOVENT HFA) 110 MCG/ACT inhaler Inhale 2 puffs into the lungs 2 (two) times daily.    . folic acid (FOLVITE) 1 MG tablet Take 1 tablet (1 mg total) by mouth daily. 30 tablet 1  . furosemide (LASIX) 80 MG tablet Take 1 tablet (80 mg total) by mouth as directed. 160 MG IN THE MORNING AND 80 MG IN THE PM 90 tablet 3  . gabapentin (NEURONTIN) 300 MG capsule Take 1 capsule (300 mg total) by mouth 2 (two) times daily. 60 capsule 1  . glimepiride (AMARYL) 1 MG tablet Take 1 tablet (1 mg total) by mouth daily with breakfast. 30 tablet 1  . levothyroxine (SYNTHROID, LEVOTHROID) 125 MCG tablet Take 1.5 tablets (187 mcg total) by mouth daily before breakfast. 30 tablet 3  . magnesium oxide (MAG-OX) 400 (241.3 MG) MG tablet Take 0.5 tablets (200 mg total) by mouth 2 (two) times daily. 30 tablet 0  . omeprazole (PRILOSEC) 20  MG capsule Take 1 capsule (20 mg total) by mouth daily. 30 capsule 1  . oxyCODONE (OXY IR/ROXICODONE) 5 MG immediate release tablet Take 1 tablet (5 mg total) by mouth every 6 (six) hours as needed for moderate pain. 30 tablet 0  . potassium chloride SA (KLOR-CON M20) 20 MEQ tablet Take 1 tablet (20 mEq total) by mouth as directed. 40 MEQ IN THE MORNING AND 20 MEQ IN THE PM 90 tablet 3  . tamoxifen (NOLVADEX) 20 MG tablet Take 1 tablet (20 mg total) by mouth daily. 90 tablet 3  . venlafaxine XR (EFFEXOR-XR) 37.5 MG 24 hr capsule TAKE ONE CAPSULE BY MOUTH DAILY 30 capsule 3   No current facility-administered medications for this visit.    Allergies:   Erythromycin    Social History:  The patient  reports that he quit smoking about 46 years ago. He has never used smokeless tobacco. He reports that he drinks alcohol. He reports that he uses illicit drugs (Marijuana).   Family History:  The patient's family history includes Cancer in his mother; Cancer (age of onset: 41) in his brother; Stroke in his father and mother. There is no history of Heart attack.    ROS:   Please see the history of present illness.   Review of Systems  Constitution: Positive for diaphoresis and weight loss.  Cardiovascular: Positive for dyspnea on exertion, irregular heartbeat and leg swelling.  Respiratory: Positive for shortness of breath.   Musculoskeletal: Positive for back pain, joint pain and myalgias.  Gastrointestinal: Positive for constipation.  Neurological: Positive for dizziness and loss of balance.  All other systems reviewed and are negative.     PHYSICAL EXAM: VS:  BP 80/40 mmHg  Pulse 76  Ht 5\' 9"  (1.753 m)  Wt 249 lb 1.9 oz (113 kg)  BMI 36.77 kg/m2    Wt Readings from Last 3 Encounters:  06/25/15 249 lb 1.9 oz (113 kg)  06/18/15 243 lb 11.2 oz (110.542 kg)  06/05/15 265 lb 3.4 oz (120.3 kg)     GEN: Well nourished, well developed, in no acute distress HEENT: normal Neck: no JVD at  90, no masses Cardiac:  Normal S1/S2, irregularly irregular rhythm; no murmur ,  no rubs or gallops, 2+ bilateral LE edema up to the knee (L >R) Respiratory:  Decreased breath sounds bilaterally, no wheezing, rhonchi or rales. GI: soft, nontender, nondistended, + BS MS: no deformity or atrophy Skin: warm and dry  Neuro:  CNs II-XII intact, Strength and sensation are intact Psych: Normal affect   EKG:  EKG is ordered today.  It demonstrates:   Atrial fibrillation, HR 76, QTC 450 ms   Recent Labs: 07/21/2014: Pro B Natriuretic peptide (BNP) 3542.0* 10/03/2014: TSH 1.941 06/02/2015: Magnesium 1.7 06/12/2015: ALT 12*; Hemoglobin 10.0*; Platelets 166 06/13/2015: B Natriuretic Peptide 346.5* 06/17/2015: BUN 20; Creatinine, Ser 0.94; Potassium 4.1; Sodium 139    Lipid Panel    Component Value Date/Time   CHOL 100 08/02/2014 0524   TRIG 88 08/02/2014 0524   HDL 34* 08/02/2014 0524   CHOLHDL 2.9 08/02/2014 0524   VLDL 18 08/02/2014 0524   LDLCALC 48 08/02/2014 0524      ASSESSMENT AND PLAN:    Acute on Chronic Diastolic CHF: Patient does not weigh at home. However, he feels that his weights have increased since DC from inpatient rehabilitation. He also notes worsening LE edema. He tells me that his previous dose of Lasix was 160 mg twice a day. He has taken metolazone the past as well. He is currently on Lasix 80 mg twice a day. His blood pressure is running low. He has some symptoms related to this.   -  Increase Lasix to 160 mg in the morning and 80 mg in the afternoon  -  Increase potassium to 40 mEq in the morning and 20 mg in the afternoon  -  BMET, CBC, BNP, magnesium today.  -  Repeat BMET 1 week.  -  Close follow-up 1 week.  CAD s/p CABG:  No angina. Continue aspirin, statin, beta blocker.  Atrial Fibrillation:  Chronic. Rate controlled. He has been deemed a poor candidate for anticoagulation in the past because of alcohol abuse. We can consider resuming anticoagulation in the  future if he remains off of alcohol.  Carotid Stenosis:  Stable bilateral disease by ultrasound 6/16. Follow-up should be planned in 03/2016.  ETOH Abuse:  He denies alcohol abuse in the past 2 months.  HTN:  Blood pressure is low. He does have some dizziness and loss of balance. Losartan was recently started.  -  Stop losartan  -  Hold carvedilol today and tomorrow, then resume.  -  Check BMET, CBC  Chronic Kidney Disease:  Obtain BMET today.  Hyperlipidemia:  Continue statin.  Breast cancer: Status post mastectomy. He has been treated with tamoxifen. He is followed by oncology.  S/p SB resection:  Follow up with Gen Surgery as planned.    Medication Changes: Current medicines are reviewed at length with the patient today.  Concerns regarding medicines are as outlined above.  The following changes have been made:   Discontinued Medications   LOSARTAN (COZAAR) 25 MG TABLET    Take 1 tablet (25 mg total) by mouth daily.   SENNA-DOCUSATE (SENOKOT-S) 8.6-50 MG PER TABLET    Take 1 tablet by mouth at bedtime. For constipation   Modified Medications   Modified Medication Previous Medication   FUROSEMIDE (LASIX) 80 MG TABLET furosemide (LASIX) 80  MG tablet      Take 1 tablet (80 mg total) by mouth as directed. 160 MG IN THE MORNING AND 80 MG IN THE PM    Take 1 tablet (80 mg total) by mouth 2 (two) times daily.   POTASSIUM CHLORIDE SA (KLOR-CON M20) 20 MEQ TABLET potassium chloride SA (KLOR-CON M20) 20 MEQ tablet      Take 1 tablet (20 mEq total) by mouth as directed. 40 MEQ IN THE MORNING AND 20 MEQ IN THE PM    Take 1 tablet (20 mEq total) by mouth 2 (two) times daily.   New Prescriptions   No medications on file    Labs/ tests ordered today include:   Orders Placed This Encounter  Procedures  . Basic Metabolic Panel (BMET)  . Magnesium  . CBC w/Diff  . B Nat Peptide  . Basic Metabolic Panel (BMET)  . EKG 12-Lead      Disposition:    FU with 1 week with me or Dr.  Lauree Chandler    Signed, [name] Dopp, PA-C, MHS 06/25/2015 12:06 PM    [address], [place], [place]  [address] Phone: [phone]; Fax: [phone]

## 2015-06-25 ENCOUNTER — Encounter: Payer: Self-pay | Admitting: Physician Assistant

## 2015-06-25 ENCOUNTER — Ambulatory Visit (INDEPENDENT_AMBULATORY_CARE_PROVIDER_SITE_OTHER): Payer: Medicare Other | Admitting: Physician Assistant

## 2015-06-25 VITALS — BP 80/40 | HR 76 | Ht 69.0 in | Wt 249.1 lb

## 2015-06-25 DIAGNOSIS — N183 Chronic kidney disease, stage 3 (moderate): Secondary | ICD-10-CM

## 2015-06-25 DIAGNOSIS — I6523 Occlusion and stenosis of bilateral carotid arteries: Secondary | ICD-10-CM

## 2015-06-25 DIAGNOSIS — Z9049 Acquired absence of other specified parts of digestive tract: Secondary | ICD-10-CM

## 2015-06-25 DIAGNOSIS — Z9889 Other specified postprocedural states: Secondary | ICD-10-CM

## 2015-06-25 DIAGNOSIS — E785 Hyperlipidemia, unspecified: Secondary | ICD-10-CM

## 2015-06-25 DIAGNOSIS — I10 Essential (primary) hypertension: Secondary | ICD-10-CM

## 2015-06-25 DIAGNOSIS — I482 Chronic atrial fibrillation, unspecified: Secondary | ICD-10-CM

## 2015-06-25 DIAGNOSIS — F1010 Alcohol abuse, uncomplicated: Secondary | ICD-10-CM

## 2015-06-25 DIAGNOSIS — I5033 Acute on chronic diastolic (congestive) heart failure: Secondary | ICD-10-CM

## 2015-06-25 DIAGNOSIS — I2510 Atherosclerotic heart disease of native coronary artery without angina pectoris: Secondary | ICD-10-CM | POA: Diagnosis not present

## 2015-06-25 LAB — BASIC METABOLIC PANEL
BUN: 50 mg/dL — AB (ref 6–23)
CHLORIDE: 99 meq/L (ref 96–112)
CO2: 33 meq/L — AB (ref 19–32)
CREATININE: 1.42 mg/dL (ref 0.40–1.50)
Calcium: 9.2 mg/dL (ref 8.4–10.5)
GFR: 52.15 mL/min — ABNORMAL LOW (ref >60.00)
Glucose, Bld: 98 mg/dL (ref 70–99)
Potassium: 4.9 mEq/L (ref 3.5–5.1)
Sodium: 138 mEq/L (ref 135–145)

## 2015-06-25 LAB — MAGNESIUM: Magnesium: 1.5 mg/dL (ref 1.5–2.5)

## 2015-06-25 MED ORDER — FUROSEMIDE 80 MG PO TABS
80.0000 mg | ORAL_TABLET | ORAL | Status: DC
Start: 2015-06-25 — End: 2015-07-07

## 2015-06-25 MED ORDER — POTASSIUM CHLORIDE CRYS ER 20 MEQ PO TBCR: 20.0000 meq | EXTENDED_RELEASE_TABLET | ORAL | Status: DC

## 2015-06-25 NOTE — Patient Instructions (Signed)
Medication Instructions:  1. STOP COZAAR  2. INCREASE LASIX TO 160 MG IN THE MORNING AND 80 MG IN THE PM  3. INCREASE POTASSIUM TO 40 MEQ IN THE MORNING AND 20 MEQ IN THE AM  4. HOLD CARVEDILOL TODAY AND ALL DAY TOMORROW ; RESTART BACK ON 06/27/15 YOUR REGULAR DOSE  Labwork: 1. TODAY BMET, MAG LEVEL, CBC W/DIFF, BNP  2. 1 WEEK FOR BMET  Testing/Procedures: NONE  Follow-Up: 1 WEEK SCOTT WEAVER, [place] IS IN THE OFFICE  Any Other Special Instructions Will Be Listed Below (If Applicable).

## 2015-06-26 ENCOUNTER — Telehealth: Payer: Self-pay | Admitting: Physician Assistant

## 2015-06-26 DIAGNOSIS — I5032 Chronic diastolic (congestive) heart failure: Secondary | ICD-10-CM

## 2015-06-26 MED ORDER — MAGNESIUM OXIDE 400 (241.3 MG) MG PO TABS: 400.0000 mg | ORAL_TABLET | Freq: Two times a day (BID) | ORAL | Status: DC

## 2015-06-26 NOTE — Telephone Encounter (Signed)
New Message       Calling stating that pt's Pro BNP is 4,760. Faxing labs to our office.

## 2015-06-26 NOTE — Telephone Encounter (Signed)
S/w daughter DPR on file. Daughter aware of lab results. lab work 07/04/15. Increase magnesium 400 mg bid, continue on increased dose of lasix. Advised if edema/weight no better in 3-5 days call the office. Daughter verbalized plan of care by phone.

## 2015-06-30 ENCOUNTER — Telehealth: Payer: Self-pay | Admitting: Physician Assistant

## 2015-06-30 DIAGNOSIS — E1165 Type 2 diabetes mellitus with hyperglycemia: Principal | ICD-10-CM

## 2015-06-30 DIAGNOSIS — IMO0002 Reserved for concepts with insufficient information to code with codable children: Secondary | ICD-10-CM

## 2015-06-30 DIAGNOSIS — E1151 Type 2 diabetes mellitus with diabetic peripheral angiopathy without gangrene: Secondary | ICD-10-CM

## 2015-06-30 NOTE — Telephone Encounter (Signed)
New MESSAGE   Home health RN is calling to up date [name] on the Swelling of the pt,  He has 3 open wounds on his left leg, pt scraped his leg on his wheelchair at his PCP      Pt c/o swelling: STAT is pt has developed SOB within 24 hours  1. How long have you been experiencing swelling? Last week  2. Where is the swelling located?    leg  3.  Are you currently taking a "fluid pill"?    lASIX   4.  Are you currently SOB? NO  5.  Have you traveled recently? NO

## 2015-06-30 NOTE — Telephone Encounter (Signed)
If he has open wounds on leg, needs to be seen by wound clinic. Make referral to wound clinic. Richardson Dopp, PA-C   06/30/2015 5:04 PM

## 2015-06-30 NOTE — Telephone Encounter (Signed)
See phone note HHRN from today; called to give update to [employer]. PA

## 2015-06-30 NOTE — Telephone Encounter (Signed)
I s/w DPR daughter [name] about message fro [facility] earlier today who states pt has 3 open wounds. Daughter said pt scraped his leg on the wheel chair  that they are not open wounds, just scrapes. [name] said will have PA look at them 9/23 appt. [name] states pt sees so many doctors already and that these she felt were not serious. I advised I will let Brynda Rim. PA know of our conversation, she said ok and thank you.

## 2015-07-01 ENCOUNTER — Emergency Department (HOSPITAL_COMMUNITY): Payer: Medicare Other

## 2015-07-01 ENCOUNTER — Encounter (HOSPITAL_COMMUNITY): Payer: Self-pay | Admitting: Emergency Medicine

## 2015-07-01 ENCOUNTER — Inpatient Hospital Stay (HOSPITAL_COMMUNITY)
Admission: EM | Admit: 2015-07-01 | Discharge: 2015-07-07 | DRG: 871 | Disposition: A | Discharge status: Home Health | Payer: Medicare Other | Attending: Internal Medicine | Admitting: Internal Medicine

## 2015-07-01 ENCOUNTER — Encounter: Payer: Self-pay | Admitting: Cardiovascular Disease

## 2015-07-01 DIAGNOSIS — Z8673 Personal history of transient ischemic attack (TIA), and cerebral infarction without residual deficits: Secondary | ICD-10-CM | POA: Diagnosis not present

## 2015-07-01 DIAGNOSIS — E785 Hyperlipidemia, unspecified: Secondary | ICD-10-CM | POA: Diagnosis present

## 2015-07-01 DIAGNOSIS — I10 Essential (primary) hypertension: Secondary | ICD-10-CM | POA: Diagnosis not present

## 2015-07-01 DIAGNOSIS — Z7982 Long term (current) use of aspirin: Secondary | ICD-10-CM | POA: Diagnosis not present

## 2015-07-01 DIAGNOSIS — M1990 Unspecified osteoarthritis, unspecified site: Secondary | ICD-10-CM | POA: Diagnosis present

## 2015-07-01 DIAGNOSIS — R339 Retention of urine, unspecified: Secondary | ICD-10-CM | POA: Diagnosis present

## 2015-07-01 DIAGNOSIS — E039 Hypothyroidism, unspecified: Secondary | ICD-10-CM | POA: Diagnosis present

## 2015-07-01 DIAGNOSIS — D696 Thrombocytopenia, unspecified: Secondary | ICD-10-CM | POA: Diagnosis present

## 2015-07-01 DIAGNOSIS — I480 Paroxysmal atrial fibrillation: Secondary | ICD-10-CM | POA: Diagnosis present

## 2015-07-01 DIAGNOSIS — C50921 Malignant neoplasm of unspecified site of right male breast: Secondary | ICD-10-CM | POA: Diagnosis present

## 2015-07-01 DIAGNOSIS — R609 Edema, unspecified: Secondary | ICD-10-CM | POA: Diagnosis not present

## 2015-07-01 DIAGNOSIS — J449 Chronic obstructive pulmonary disease, unspecified: Secondary | ICD-10-CM | POA: Diagnosis present

## 2015-07-01 DIAGNOSIS — L03119 Cellulitis of unspecified part of limb: Secondary | ICD-10-CM | POA: Diagnosis not present

## 2015-07-01 DIAGNOSIS — X58XXXA Exposure to other specified factors, initial encounter: Secondary | ICD-10-CM | POA: Diagnosis not present

## 2015-07-01 DIAGNOSIS — G9340 Encephalopathy, unspecified: Secondary | ICD-10-CM | POA: Diagnosis present

## 2015-07-01 DIAGNOSIS — K219 Gastro-esophageal reflux disease without esophagitis: Secondary | ICD-10-CM | POA: Diagnosis present

## 2015-07-01 DIAGNOSIS — N183 Chronic kidney disease, stage 3 (moderate): Secondary | ICD-10-CM | POA: Diagnosis present

## 2015-07-01 DIAGNOSIS — D649 Anemia, unspecified: Secondary | ICD-10-CM | POA: Diagnosis present

## 2015-07-01 DIAGNOSIS — M109 Gout, unspecified: Secondary | ICD-10-CM | POA: Diagnosis present

## 2015-07-01 DIAGNOSIS — Z823 Family history of stroke: Secondary | ICD-10-CM | POA: Diagnosis not present

## 2015-07-01 DIAGNOSIS — Z9011 Acquired absence of right breast and nipple: Secondary | ICD-10-CM | POA: Diagnosis present

## 2015-07-01 DIAGNOSIS — Z853 Personal history of malignant neoplasm of breast: Secondary | ICD-10-CM | POA: Diagnosis not present

## 2015-07-01 DIAGNOSIS — I129 Hypertensive chronic kidney disease with stage 1 through stage 4 chronic kidney disease, or unspecified chronic kidney disease: Secondary | ICD-10-CM | POA: Diagnosis present

## 2015-07-01 DIAGNOSIS — E1151 Type 2 diabetes mellitus with diabetic peripheral angiopathy without gangrene: Secondary | ICD-10-CM

## 2015-07-01 DIAGNOSIS — Z79899 Other long term (current) drug therapy: Secondary | ICD-10-CM

## 2015-07-01 DIAGNOSIS — E876 Hypokalemia: Secondary | ICD-10-CM | POA: Diagnosis present

## 2015-07-01 DIAGNOSIS — S91205A Unspecified open wound of left lesser toe(s) with damage to nail, initial encounter: Secondary | ICD-10-CM | POA: Diagnosis not present

## 2015-07-01 DIAGNOSIS — Z800 Family history of malignant neoplasm of digestive organs: Secondary | ICD-10-CM

## 2015-07-01 DIAGNOSIS — L03115 Cellulitis of right lower limb: Secondary | ICD-10-CM | POA: Diagnosis present

## 2015-07-01 DIAGNOSIS — L03116 Cellulitis of left lower limb: Secondary | ICD-10-CM | POA: Diagnosis present

## 2015-07-01 DIAGNOSIS — Z9981 Dependence on supplemental oxygen: Secondary | ICD-10-CM

## 2015-07-01 DIAGNOSIS — G4733 Obstructive sleep apnea (adult) (pediatric): Secondary | ICD-10-CM | POA: Diagnosis present

## 2015-07-01 DIAGNOSIS — R109 Unspecified abdominal pain: Secondary | ICD-10-CM

## 2015-07-01 DIAGNOSIS — A419 Sepsis, unspecified organism: Principal | ICD-10-CM | POA: Diagnosis present

## 2015-07-01 DIAGNOSIS — R0600 Dyspnea, unspecified: Secondary | ICD-10-CM | POA: Diagnosis not present

## 2015-07-01 DIAGNOSIS — Z79810 Long term (current) use of selective estrogen receptor modulators (SERMs): Secondary | ICD-10-CM | POA: Diagnosis not present

## 2015-07-01 DIAGNOSIS — R0602 Shortness of breath: Secondary | ICD-10-CM | POA: Diagnosis not present

## 2015-07-01 DIAGNOSIS — L0390 Cellulitis, unspecified: Secondary | ICD-10-CM | POA: Diagnosis not present

## 2015-07-01 DIAGNOSIS — E1165 Type 2 diabetes mellitus with hyperglycemia: Secondary | ICD-10-CM | POA: Diagnosis present

## 2015-07-01 DIAGNOSIS — Z87891 Personal history of nicotine dependence: Secondary | ICD-10-CM

## 2015-07-01 DIAGNOSIS — IMO0002 Reserved for concepts with insufficient information to code with codable children: Secondary | ICD-10-CM

## 2015-07-01 DIAGNOSIS — Z951 Presence of aortocoronary bypass graft: Secondary | ICD-10-CM | POA: Diagnosis not present

## 2015-07-01 DIAGNOSIS — E1122 Type 2 diabetes mellitus with diabetic chronic kidney disease: Secondary | ICD-10-CM | POA: Diagnosis present

## 2015-07-01 DIAGNOSIS — E1159 Type 2 diabetes mellitus with other circulatory complications: Secondary | ICD-10-CM | POA: Diagnosis not present

## 2015-07-01 DIAGNOSIS — I5032 Chronic diastolic (congestive) heart failure: Secondary | ICD-10-CM | POA: Diagnosis present

## 2015-07-01 DIAGNOSIS — J810 Acute pulmonary edema: Secondary | ICD-10-CM | POA: Diagnosis not present

## 2015-07-01 DIAGNOSIS — I5033 Acute on chronic diastolic (congestive) heart failure: Secondary | ICD-10-CM

## 2015-07-01 LAB — BASIC METABOLIC PANEL
ANION GAP: 12 (ref 5–15)
BUN: 33 mg/dL — ABNORMAL HIGH (ref 6–20)
CO2: 28 mmol/L (ref 22–32)
Calcium: 9.5 mg/dL (ref 8.9–10.3)
Chloride: 98 mmol/L — ABNORMAL LOW (ref 101–111)
Creatinine, Ser: 1.26 mg/dL — ABNORMAL HIGH (ref 0.61–1.24)
GFR, EST NON AFRICAN AMERICAN: 56 mL/min — AB (ref >60)
GLUCOSE: 156 mg/dL — AB (ref 65–99)
POTASSIUM: 4.6 mmol/L (ref 3.5–5.1)
Sodium: 138 mmol/L (ref 135–145)

## 2015-07-01 LAB — CBC WITH DIFFERENTIAL/PLATELET
BASOS ABS: 0 10*3/uL (ref 0.0–0.1)
Basophils Absolute: 0 10*3/uL (ref 0.0–0.1)
Basophils Relative: 0 %
Basophils Relative: 0 %
EOS ABS: 0 10*3/uL (ref 0.0–0.7)
EOS PCT: 0 %
Eosinophils Absolute: 0.3 10*3/uL (ref 0.0–0.7)
Eosinophils Relative: 2 %
HCT: 33.8 % — ABNORMAL LOW (ref 39.0–52.0)
HEMATOCRIT: 38.9 % — AB (ref 39.0–52.0)
Hemoglobin: 11.9 g/dL — ABNORMAL LOW (ref 13.0–17.0)
Hemoglobin: 10.3 g/dL — ABNORMAL LOW (ref 13.0–17.0)
LYMPHS ABS: 1.2 10*3/uL (ref 0.7–4.0)
LYMPHS PCT: 7 %
Lymphocytes Relative: 9 %
Lymphs Abs: 0.8 10*3/uL (ref 0.7–4.0)
MCH: 31.6 pg (ref 26.0–34.0)
MCH: 31.2 pg (ref 26.0–34.0)
MCHC: 30.6 g/dL (ref 30.0–36.0)
MCHC: 30.5 g/dL (ref 30.0–36.0)
MCV: 103.2 fL — AB (ref 78.0–100.0)
MCV: 102.4 fL — ABNORMAL HIGH (ref 78.0–100.0)
Monocytes Absolute: 0.9 10*3/uL (ref 0.1–1.0)
Monocytes Absolute: 0.9 10*3/uL (ref 0.1–1.0)
Monocytes Relative: 7 %
Monocytes Relative: 7 %
NEUTROS ABS: 10.8 10*3/uL — AB (ref 1.7–7.7)
NEUTROS PCT: 84 %
Neutro Abs: 11.1 10*3/uL — ABNORMAL HIGH (ref 1.7–7.7)
Neutrophils Relative %: 84 %
PLATELETS: 114 10*3/uL — AB (ref 150–400)
Platelets: 120 10*3/uL — ABNORMAL LOW (ref 150–400)
RBC: 3.77 MIL/uL — AB (ref 4.22–5.81)
RBC: 3.3 MIL/uL — AB (ref 4.22–5.81)
RDW: 17.4 % — ABNORMAL HIGH (ref 11.5–15.5)
RDW: 17.2 % — ABNORMAL HIGH (ref 11.5–15.5)
WBC: 12.7 10*3/uL — AB (ref 4.0–10.5)
WBC: 13.3 10*3/uL — AB (ref 4.0–10.5)

## 2015-07-01 LAB — URINE MICROSCOPIC-ADD ON

## 2015-07-01 LAB — URINALYSIS, ROUTINE W REFLEX MICROSCOPIC
BILIRUBIN URINE: NEGATIVE
Glucose, UA: NEGATIVE mg/dL
Hgb urine dipstick: NEGATIVE
Ketones, ur: NEGATIVE mg/dL
Leukocytes, UA: NEGATIVE
NITRITE: NEGATIVE
PROTEIN: 100 mg/dL — AB
SPECIFIC GRAVITY, URINE: 1.013 (ref 1.005–1.030)
UROBILINOGEN UA: 1 mg/dL (ref 0.0–1.0)
pH: 5.5 (ref 5.0–8.0)

## 2015-07-01 LAB — MRSA PCR SCREENING: MRSA BY PCR: NEGATIVE

## 2015-07-01 LAB — I-STAT CG4 LACTIC ACID, ED: LACTIC ACID, VENOUS: 2.17 mmol/L — AB (ref 0.5–2.0)

## 2015-07-01 LAB — BRAIN NATRIURETIC PEPTIDE: B Natriuretic Peptide: 331.3 pg/mL — ABNORMAL HIGH (ref 0.0–100.0)

## 2015-07-01 MED ORDER — ENOXAPARIN SODIUM 30 MG/0.3ML ~~LOC~~ SOLN
30.0000 mg | Freq: Every day | SUBCUTANEOUS | Status: DC
  Administered 2015-07-01: 30 mg via SUBCUTANEOUS
  Filled 2015-07-01: qty 0.3

## 2015-07-01 MED ORDER — FLUTICASONE PROPIONATE HFA 110 MCG/ACT IN AERO: 2.0000 | INHALATION_SPRAY | Freq: Two times a day (BID) | RESPIRATORY_TRACT | Status: DC

## 2015-07-01 MED ORDER — SODIUM CHLORIDE 0.9 % IV BOLUS (SEPSIS): 500.0000 mL | INTRAVENOUS | Status: DC

## 2015-07-01 MED ORDER — SODIUM CHLORIDE 0.9 % IV BOLUS (SEPSIS)
1000.0000 mL | INTRAVENOUS | Status: DC
  Administered 2015-07-01: 1000 mL via INTRAVENOUS

## 2015-07-01 MED ORDER — PANTOPRAZOLE SODIUM 40 MG PO TBEC
40.0000 mg | DELAYED_RELEASE_TABLET | Freq: Every day | ORAL | Status: DC
  Administered 2015-07-02 – 2015-07-07 (×6): 40 mg via ORAL
  Filled 2015-07-01 (×6): qty 1

## 2015-07-01 MED ORDER — FLUOXETINE HCL 20 MG PO CAPS
20.0000 mg | ORAL_CAPSULE | Freq: Every day | ORAL | Status: DC
  Administered 2015-07-02 – 2015-07-07 (×6): 20 mg via ORAL
  Filled 2015-07-01 (×6): qty 1

## 2015-07-01 MED ORDER — PIPERACILLIN-TAZOBACTAM 3.375 G IVPB
3.3750 g | Freq: Three times a day (TID) | INTRAVENOUS | Status: DC
  Administered 2015-07-02 – 2015-07-05 (×10): 3.375 g via INTRAVENOUS
  Filled 2015-07-01 (×13): qty 50

## 2015-07-01 MED ORDER — TAMOXIFEN CITRATE 10 MG PO TABS
20.0000 mg | ORAL_TABLET | Freq: Every day | ORAL | Status: DC
  Administered 2015-07-01 – 2015-07-06 (×6): 20 mg via ORAL
  Filled 2015-07-01 (×10): qty 2

## 2015-07-01 MED ORDER — ALLOPURINOL 300 MG PO TABS
300.0000 mg | ORAL_TABLET | Freq: Every day | ORAL | Status: DC
  Administered 2015-07-02 – 2015-07-07 (×6): 300 mg via ORAL
  Filled 2015-07-01 (×6): qty 1

## 2015-07-01 MED ORDER — SODIUM CHLORIDE 0.9 % IV SOLN
1250.0000 mg | INTRAVENOUS | Status: DC
  Administered 2015-07-02 – 2015-07-04 (×3): 1250 mg via INTRAVENOUS
  Filled 2015-07-01 (×4): qty 1250

## 2015-07-01 MED ORDER — FOLIC ACID 1 MG PO TABS
1.0000 mg | ORAL_TABLET | Freq: Every day | ORAL | Status: DC
  Administered 2015-07-02 – 2015-07-07 (×6): 1 mg via ORAL
  Filled 2015-07-01 (×6): qty 1

## 2015-07-01 MED ORDER — INSULIN ASPART 100 UNIT/ML ~~LOC~~ SOLN
0.0000 [IU] | Freq: Three times a day (TID) | SUBCUTANEOUS | Status: DC
  Administered 2015-07-03: 1 [IU] via SUBCUTANEOUS
  Administered 2015-07-04 (×3): 2 [IU] via SUBCUTANEOUS
  Administered 2015-07-05: 1 [IU] via SUBCUTANEOUS
  Administered 2015-07-05 – 2015-07-07 (×3): 2 [IU] via SUBCUTANEOUS

## 2015-07-01 MED ORDER — SODIUM CHLORIDE 0.9 % IV SOLN
INTRAVENOUS | Status: DC
  Administered 2015-07-01: 10 mL/h via INTRAVENOUS

## 2015-07-01 MED ORDER — ONDANSETRON HCL 4 MG PO TABS: 4.0000 mg | ORAL_TABLET | Freq: Four times a day (QID) | ORAL | Status: DC | PRN

## 2015-07-01 MED ORDER — VANCOMYCIN HCL 10 G IV SOLR
2250.0000 mg | Freq: Once | INTRAVENOUS | Status: AC
  Administered 2015-07-01: 2250 mg via INTRAVENOUS
  Filled 2015-07-01: qty 2250

## 2015-07-01 MED ORDER — PIPERACILLIN-TAZOBACTAM 3.375 G IVPB 30 MIN
3.3750 g | Freq: Once | INTRAVENOUS | Status: AC
  Administered 2015-07-01: 3.375 g via INTRAVENOUS
  Filled 2015-07-01: qty 50

## 2015-07-01 MED ORDER — CLINDAMYCIN PHOSPHATE 900 MG/50ML IV SOLN
900.0000 mg | Freq: Once | INTRAVENOUS | Status: AC
  Administered 2015-07-01: 900 mg via INTRAVENOUS
  Filled 2015-07-01: qty 50

## 2015-07-01 MED ORDER — ONDANSETRON HCL 4 MG/2ML IJ SOLN: 4.0000 mg | Freq: Four times a day (QID) | INTRAMUSCULAR | Status: DC | PRN

## 2015-07-01 MED ORDER — COLCHICINE 0.6 MG PO TABS
0.6000 mg | ORAL_TABLET | Freq: Every day | ORAL | Status: DC
  Administered 2015-07-02 – 2015-07-07 (×6): 0.6 mg via ORAL
  Filled 2015-07-01 (×6): qty 1

## 2015-07-01 MED ORDER — VENLAFAXINE HCL ER 37.5 MG PO CP24
37.5000 mg | ORAL_CAPSULE | Freq: Every day | ORAL | Status: DC
  Administered 2015-07-02 – 2015-07-07 (×6): 37.5 mg via ORAL
  Filled 2015-07-01 (×7): qty 1

## 2015-07-01 MED ORDER — LEVOTHYROXINE SODIUM 50 MCG PO TABS
187.0000 ug | ORAL_TABLET | Freq: Every day | ORAL | Status: DC
  Administered 2015-07-02 – 2015-07-07 (×6): 187 ug via ORAL
  Filled 2015-07-01 (×10): qty 1

## 2015-07-01 MED ORDER — ACETAMINOPHEN 325 MG PO TABS
650.0000 mg | ORAL_TABLET | Freq: Once | ORAL | Status: AC
  Administered 2015-07-01: 650 mg via ORAL
  Filled 2015-07-01: qty 2

## 2015-07-01 MED ORDER — VANCOMYCIN HCL IN DEXTROSE 1-5 GM/200ML-% IV SOLN: 1000.0000 mg | Freq: Once | INTRAVENOUS | Status: DC

## 2015-07-01 MED ORDER — ATORVASTATIN CALCIUM 20 MG PO TABS
20.0000 mg | ORAL_TABLET | Freq: Every day | ORAL | Status: DC
  Administered 2015-07-02 – 2015-07-07 (×6): 20 mg via ORAL
  Filled 2015-07-01 (×6): qty 1

## 2015-07-01 MED ORDER — BUDESONIDE 0.5 MG/2ML IN SUSP
0.5000 mg | Freq: Two times a day (BID) | RESPIRATORY_TRACT | Status: DC
  Administered 2015-07-02 – 2015-07-07 (×11): 0.5 mg via RESPIRATORY_TRACT
  Filled 2015-07-01 (×11): qty 2

## 2015-07-01 MED ORDER — FUROSEMIDE 10 MG/ML IJ SOLN
80.0000 mg | Freq: Once | INTRAMUSCULAR | Status: AC
  Administered 2015-07-01: 80 mg via INTRAVENOUS
  Filled 2015-07-01: qty 8

## 2015-07-01 MED ORDER — SODIUM CHLORIDE 0.9 % IV BOLUS (SEPSIS)
1000.0000 mL | Freq: Once | INTRAVENOUS | Status: AC
  Administered 2015-07-01: 1000 mL via INTRAVENOUS

## 2015-07-01 MED ORDER — TAMOXIFEN CITRATE 20 MG PO TABS: 20.0000 mg | ORAL_TABLET | Freq: Every day | ORAL | Status: DC

## 2015-07-01 MED ORDER — GLIMEPIRIDE 1 MG PO TABS
1.0000 mg | ORAL_TABLET | Freq: Every day | ORAL | Status: DC
  Administered 2015-07-02 – 2015-07-04 (×3): 1 mg via ORAL
  Filled 2015-07-01 (×4): qty 1

## 2015-07-01 MED ORDER — IOHEXOL 300 MG/ML  SOLN
25.0000 mL | Freq: Once | INTRAMUSCULAR | Status: AC | PRN
  Administered 2015-07-01: 25 mL via ORAL

## 2015-07-01 MED ORDER — GABAPENTIN 300 MG PO CAPS
300.0000 mg | ORAL_CAPSULE | Freq: Two times a day (BID) | ORAL | Status: DC
  Administered 2015-07-01 – 2015-07-07 (×12): 300 mg via ORAL
  Filled 2015-07-01 (×12): qty 1

## 2015-07-01 MED ORDER — ASPIRIN EC 325 MG PO TBEC
325.0000 mg | DELAYED_RELEASE_TABLET | Freq: Every day | ORAL | Status: DC
  Administered 2015-07-02 – 2015-07-07 (×6): 325 mg via ORAL
  Filled 2015-07-01 (×6): qty 1

## 2015-07-01 NOTE — Progress Notes (Signed)
ANTIBIOTIC CONSULT NOTE - INITIAL  Pharmacy Consult for Zosyn and Vancomycin Indication: Sepsis  Allergies  Allergen Reactions  . Erythromycin Hives    Oramycin    Patient Measurements: TBW 113 kg  Vital Signs: Temp: 102.5 F (39.2 C) (09/20 1734) Temp Source: Rectal (09/20 1734) BP: 142/61 mmHg (09/20 1652) Pulse Rate: 91 (09/20 1652) Intake/Output from previous day:   Intake/Output from this shift:    Labs:  Recent Labs  07/01/15 1239  WBC 12.7*  HGB 11.9*  PLT 120*  CREATININE 1.26*   Estimated Creatinine Clearance: 66.6 mL/min (by C-G formula based on Cr of 1.26). No results for input(s): VANCOTROUGH, VANCOPEAK, VANCORANDOM, GENTTROUGH, GENTPEAK, GENTRANDOM, TOBRATROUGH, TOBRAPEAK, TOBRARND, AMIKACINPEAK, AMIKACINTROU, AMIKACIN in the last 72 hours.   Microbiology: Recent Results (from the past 720 hour(s))  MRSA PCR Screening     Status: None   Collection Time: 06/02/15  6:32 PM  Result Value Ref Range Status   MRSA by PCR NEGATIVE NEGATIVE Final    Comment:        The GeneXpert MRSA Assay (FDA approved for NASAL specimens only), is one component of a comprehensive MRSA colonization surveillance program. It is not intended to diagnose MRSA infection nor to guide or monitor treatment for MRSA infections.   Culture, Urine     Status: None   Collection Time: 06/11/15 12:54 PM  Result Value Ref Range Status   Specimen Description URINE, CLEAN CATCH  Final   Special Requests NONE  Final   Culture MULTIPLE SPECIES PRESENT, SUGGEST RECOLLECTION  Final   Report Status 06/12/2015 FINAL  Final    Medical History: Past Medical History  Diagnosis Date  . CHRONIC OBSTRUCTIVE PULMONARY DISEASE 06/20/2009  . CAROTID STENOSIS 06/20/2009    A. 08/2001 s/p L CEA;  B.   09/14/11 - Carotid U/S - 40-59% bilateral stenosis, left CEA patch angioplasty is patent  . DM 06/20/2009  . CAD 06/20/2009    A.  08/2000 - s/p CABG x 4 - LIMA-LAD, Left Radial-OM, VG-DIAG,  VG-RCA;  B. Neg. MV  2010  . HYPERLIPIDEMIA 06/20/2009  . HYPERTENSION 06/20/2009  . Hypothyroidism   . Low back pain   . Asthma     as child  . Pneumonia   . Persistent atrial fibrillation     Not felt to be coumadin candidate 2/2 ETOH use.  Marland Kitchen History of tobacco abuse     remote - quit 1970  . Morbidly obese   . Chronic diastolic heart failure, NYHA class 3     Followed by CHF Clinic --> Echo 04/2014: EF 16-10%, Diastolic DysFxn - elevated LVEDP & LAP. Mod Dil LA & RA.  . Falls frequently   . Hx of cardiovascular stress test 05/2014    Lexiscan Myoview (8/15):  No ischemia; EF 63% - Normal Study  . Breast cancer, right breast   . Gout   . OBSTRUCTIVE SLEEP APNEA 06/20/2009    does not use cpap  . Stroke     found on MRI in Oct. 2015.   Marland Kitchen Shortness of breath dyspnea     occasional  . Depression     takes Prozac  . GERD (gastroesophageal reflux disease)     takes Prilosec  . Arthritis   . Neuromuscular disorder     neuropathy in both legs  . Constipation   . ETOH abuse     no alcohol since Oct. 2015  . 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 CHF (congestive heart failure)   . Supplemental oxygen dependent     hs  . Bilateral renal cysts   . Kidney disease     Stage 3    Assessment: 69 yo M presents on 9/20 with pain from R wrist and R foot. Also had significant crackles to both lung bases and pitting edema to lower extremities. Pharmacy consulted to dose abx for sepsis. Given one dose of Zosyn and clindamycin in the ED. Febrile up to 102.5, WBC elevated at 12.7. SCr 1.26, normalized CrCl ~37ml/min.  Goal of Therapy:  Vancomycin trough level 15-20 mcg/ml  Resolution of infection  Plan:  Start Zosyn 3.375 gm IV q8h (4 hour infusion) Give vancomycin 2.25g IV x 1, then start 1250mg  IV Q24 Monitor clinical picture, renal function, VT at Css F/U C&S, abx deescalation / LOT  BATCHELDER,NATHAN J 07/01/2015,6:14 PM

## 2015-07-01 NOTE — Addendum Note (Signed)
Addended by: Michae Kava on: 07/01/2015 02:55 PM   Modules accepted: Orders

## 2015-07-01 NOTE — ED Notes (Signed)
Code Sepsis called @ 1814

## 2015-07-01 NOTE — ED Notes (Signed)
[name]-(daughter) [phone].  Call with any results or questions

## 2015-07-01 NOTE — ED Provider Notes (Signed)
CSN: [id number]     Arrival date & time 07/01/15  1130 History   First MD Initiated Contact with Patient 07/01/15 1556     Chief Complaint  Patient presents with  . Arm Pain  . Leg Pain     (Consider location/radiation/quality/duration/timing/severity/associated sxs/prior Treatment) Patient is a 69 y.o. male presenting with hand pain.  Hand Pain This is a recurrent problem. The current episode started today. The problem occurs constantly. The problem has been unchanged. Associated symptoms include arthralgias and joint swelling. Pertinent negatives include no abdominal pain, chest pain, chills, congestion, fever, headaches, myalgias, nausea, sore throat or vomiting. Associated symptoms comments: Left leg pain, confusion, b/l leg swelling. Nothing aggravates the symptoms. He has tried nothing for the symptoms.    Past Medical History  Diagnosis Date  . CHRONIC OBSTRUCTIVE PULMONARY DISEASE 06/20/2009  . CAROTID STENOSIS 06/20/2009    A. 08/2001 s/p L CEA;  B.   09/14/11 - Carotid U/S - 40-59% bilateral stenosis, left CEA patch angioplasty is patent  . DM 06/20/2009  . CAD 06/20/2009    A.  08/2000 - s/p CABG x 4 - LIMA-LAD, Left Radial-OM, VG-DIAG, VG-RCA;  B. Neg. MV  2010  . HYPERLIPIDEMIA 06/20/2009  . HYPERTENSION 06/20/2009  . Hypothyroidism   . Low back pain   . Asthma     as child  . Pneumonia   . Persistent atrial fibrillation     Not felt to be coumadin candidate 2/2 ETOH use.  Marland Kitchen History of tobacco abuse     remote - quit 1970  . Morbidly obese   . Chronic diastolic heart failure, NYHA class 3     Followed by [facility] --> Echo 04/2014: EF 94-17%, Diastolic DysFxn - elevated LVEDP & LAP. Mod Dil LA & RA.  . Falls frequently   . Hx of cardiovascular stress test 05/2014    Lexiscan Myoview (8/15):  No ischemia; EF 63% - Normal Study  . Breast cancer, right breast   . Gout   . OBSTRUCTIVE SLEEP APNEA 06/20/2009    does not use cpap  . Stroke     found on MRI in Oct. 2015.    Marland Kitchen Shortness of breath dyspnea     occasional  . Depression     takes Prozac  . GERD (gastroesophageal reflux disease)     takes Prilosec  . Arthritis   . Neuromuscular disorder     neuropathy in both legs  . Constipation   . ETOH abuse     no alcohol since Oct. 2015  . 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 CHF (congestive heart failure)   . Supplemental oxygen dependent     hs  . Bilateral renal cysts   . Kidney disease     Stage 3   Past Surgical History  Procedure Laterality Date  . Carotid endarterectomy  2002    left  . Coronary artery bypass graft      x 4 - 2001  . Umbilical hernia repair N/A 01/28/2014    Procedure: HERNIA REPAIR UMBILICAL ADULT/INC;  Surgeon: Harl Bowie, MD;  Location: [place];  Service: General;  Laterality: N/A;  . Laparotomy N/A 01/28/2014    Procedure: EXPLORATORY LAPAROTOMY;  Surgeon: Harl Bowie, MD;  Location: [place];  Service: General;  Laterality: N/A;  . Bowel resection N/A 01/28/2014    Procedure: SMALL BOWEL RESECTION;  Surgeon: Harl Bowie,  MD;  Location: [place];  Service: General;  Laterality: N/A;  . Hernia repair    . Colonoscopy    . Total mastectomy Right 11/11/2014    Procedure: RIGHT TOTAL MASTECTOMY;  Surgeon: Excell Seltzer, MD;  Location: [place];  Service: General;  Laterality: Right;  . Cataract extraction w/phaco Left 04/08/2015    Procedure: CATARACT EXTRACTION PHACO AND INTRAOCULAR LENS PLACEMENT ([place]);  Surgeon: Birder Robson, MD;  Location: [facility];  Service: Ophthalmology;  Laterality: Left;  [place] 00:36 AP% 23.7 CDE 8.72 fluid pack [id number]  . Eye surgery Left     Catarct  . Laparotomy N/A 06/02/2015    Procedure: EXPLORATORY LAPAROTOMY;  Surgeon: Coralie Keens, MD;  Location: [place];  Service: General;  Laterality: N/A;  . Bowel resection N/A 06/02/2015    Procedure: SMALL BOWEL RESECTION;  Surgeon: Coralie Keens, MD;  Location: [facility];   Service: General;  Laterality: N/A;   Family History  Problem Relation Age of Onset  . Stroke Mother     ?  Marland Kitchen Cancer Mother     unknown type of cancer  . Stroke Father     ?  Marland Kitchen Heart attack Neg Hx   . Cancer Brother 64    stomach cancer   Social History  Substance Use Topics  . Smoking status: Former Smoker -- 1.00 packs/day for 16 years    Quit date: 10/11/1968  . Smokeless tobacco: Never Used  . Alcohol Use: Yes     Comment: last drink was 2 weeks - 8?16    Review of Systems  Constitutional: Negative for fever and chills.  HENT: Negative for congestion and sore throat.   Eyes: Negative for visual disturbance.  Respiratory: Positive for shortness of breath. Negative for wheezing.   Cardiovascular: Positive for leg swelling. Negative for chest pain.  Gastrointestinal: Negative for nausea, vomiting, abdominal pain, diarrhea and constipation.  Genitourinary: Negative for dysuria and difficulty urinating.  Musculoskeletal: Positive for joint swelling and arthralgias. Negative for myalgias.  Skin: Negative for wound.  Neurological: Negative for syncope and headaches.  Psychiatric/Behavioral: Positive for confusion. Negative for behavioral problems.  All other systems reviewed and are negative.     Allergies  Erythromycin  Home Medications   Prior to Admission medications   Medication Sig Start Date End Date Taking? Authorizing Provider  albuterol-ipratropium (COMBIVENT) 18-103 MCG/ACT inhaler Inhale 2 puffs into the lungs 2 (two) times daily. 06/18/15  Yes Ivan Anchors Love, PA-C  allopurinol (ZYLOPRIM) 300 MG tablet Take 1 tablet (300 mg total) by mouth daily. 08/02/14  Yes Daniel J Angiulli, PA-C  aspirin EC 325 MG EC tablet Take 1 tablet (325 mg total) by mouth daily. 08/02/14  Yes Daniel J Angiulli, PA-C  atorvastatin (LIPITOR) 20 MG tablet Take 1 tablet (20 mg total) by mouth daily. 08/02/14  Yes Daniel J Angiulli, PA-C  carvedilol (COREG) 3.125 MG tablet Take 1 tablet  (3.125 mg total) by mouth 2 (two) times daily with a meal. 06/18/15  Yes Ivan Anchors Love, PA-C  colchicine 0.6 MG tablet Take 0.6 mg by mouth daily. 01/13/15  Yes Historical Provider, MD  FLUoxetine (PROZAC) 20 MG capsule Take 1 capsule (20 mg total) by mouth daily. 06/18/15  Yes Ivan Anchors Love, PA-C  fluticasone (FLOVENT HFA) 110 MCG/ACT inhaler Inhale 2 puffs into the lungs 2 (two) times daily.   Yes Historical Provider, MD  folic acid (FOLVITE) 1 MG tablet Take 1 tablet (1 mg total) by mouth daily. 08/02/14  Yes  Lavon Paganini Angiulli, PA-C  furosemide (LASIX) 80 MG tablet Take 1 tablet (80 mg total) by mouth as directed. 160 MG IN THE MORNING AND 80 MG IN THE PM Patient taking differently: Take 80-160 mg by mouth 2 (two) times daily. 160 MG IN THE MORNING AND 80 MG IN THE PM 06/25/15  Yes Scott Joylene Draft, PA-C  gabapentin (NEURONTIN) 300 MG capsule Take 1 capsule (300 mg total) by mouth 2 (two) times daily. 08/02/14  Yes Daniel J Angiulli, PA-C  glimepiride (AMARYL) 1 MG tablet Take 1 tablet (1 mg total) by mouth daily with breakfast. 06/18/15  Yes Ivan Anchors Love, PA-C  levothyroxine (SYNTHROID, LEVOTHROID) 125 MCG tablet Take 1.5 tablets (187 mcg total) by mouth daily before breakfast. 08/02/14  Yes Daniel J Angiulli, PA-C  magnesium oxide (MAG-OX) 400 (241.3 MG) MG tablet Take 1 tablet (400 mg total) by mouth 2 (two) times daily. 06/26/15  Yes Scott Joylene Draft, PA-C  omeprazole (PRILOSEC) 20 MG capsule Take 1 capsule (20 mg total) by mouth daily. 08/02/14  Yes Daniel J Angiulli, PA-C  Oxycodone HCl 10 MG TABS Take 10 mg by mouth 3 (three) times daily as needed (pain).   Yes Historical Provider, MD  potassium chloride SA (KLOR-CON M20) 20 MEQ tablet Take 1 tablet (20 mEq total) by mouth as directed. [place] PM Patient taking differently: Take 20-40 mEq by mouth 2 (two) times daily. [place] MORNING AND 20 MEQ IN THE PM 06/25/15  Yes Scott Joylene Draft, PA-C  tamoxifen (NOLVADEX) 20 MG  tablet Take 1 tablet (20 mg total) by mouth daily. Patient taking differently: Take 20 mg by mouth at bedtime.  09/09/14  Yes Nicholas Lose, MD  venlafaxine XR (EFFEXOR-XR) 37.5 MG 24 hr capsule TAKE ONE CAPSULE BY MOUTH DAILY 02/24/15  Yes Nicholas Lose, MD  oxyCODONE (OXY IR/ROXICODONE) 5 MG immediate release tablet Take 1 tablet (5 mg total) by mouth every 6 (six) hours as needed for moderate pain. Patient not taking: Reported on 07/01/2015 06/18/15   Ivan Anchors Love, PA-C   BP 149/79 mmHg  Pulse 89  Temp(Src) 98.2 F (36.8 C) (Oral)  Resp 20  SpO2 100% Physical Exam  Constitutional: He appears well-developed and well-nourished.  HENT:  Head: Normocephalic and atraumatic.  Eyes: EOM are normal.  Neck: Normal range of motion.  Cardiovascular: Normal rate, regular rhythm and normal heart sounds.   No murmur heard. Pulmonary/Chest: Effort normal and breath sounds normal. No respiratory distress.  Abdominal: Soft. There is no tenderness.  Musculoskeletal: He exhibits edema and tenderness.  Neurological: He is alert.  Skin: No rash noted. He is not diaphoretic.    ED Course  Procedures (including critical care time) Labs Review Labs Reviewed  CBC WITH DIFFERENTIAL/PLATELET - Abnormal; Notable for the following:    WBC 12.7 (*)    RBC 3.77 (*)    Hemoglobin 11.9 (*)    HCT 38.9 (*)    MCV 103.2 (*)    RDW 17.4 (*)    Platelets 120 (*)    Neutro Abs 10.8 (*)    All other components within normal limits  BASIC METABOLIC PANEL - Abnormal; Notable for the following:    Chloride 98 (*)    Glucose, Bld 156 (*)    BUN 33 (*)    Creatinine, Ser 1.26 (*)    GFR calc non Af Amer 56 (*)    All other components within normal  limits  BRAIN NATRIURETIC PEPTIDE - Abnormal; Notable for the following:    B Natriuretic Peptide 331.3 (*)    All other components within normal limits  URINALYSIS, ROUTINE W REFLEX MICROSCOPIC (NOT AT [facility]) - Abnormal; Notable for the following:    Protein, ur 100  (*)    All other components within normal limits  URINE MICROSCOPIC-ADD ON - Abnormal; Notable for the following:    Bacteria, UA FEW (*)    Casts HYALINE CASTS (*)    All other components within normal limits  CBC WITH DIFFERENTIAL/PLATELET - Abnormal; Notable for the following:    WBC 13.3 (*)    RBC 3.30 (*)    Hemoglobin 10.3 (*)    HCT 33.8 (*)    MCV 102.4 (*)    RDW 17.2 (*)    Platelets 114 (*)    Neutro Abs 11.1 (*)    All other components within normal limits  I-STAT CG4 LACTIC ACID, ED - Abnormal; Notable for the following:    Lactic Acid, Venous 2.17 (*)    All other components within normal limits  URINE CULTURE  CULTURE, BLOOD (ROUTINE X 2)  CULTURE, BLOOD (ROUTINE X 2)  MRSA PCR SCREENING  CBC  COMPREHENSIVE METABOLIC PANEL  CBC WITH DIFFERENTIAL/PLATELET  MAGNESIUM  PHOSPHORUS  I-STAT CG4 LACTIC ACID, ED  I-STAT CG4 LACTIC ACID, ED  I-STAT CG4 LACTIC ACID, ED    Imaging Review Ct Abdomen Pelvis Wo Contrast  07/01/2015   CLINICAL DATA:  Abdominal pain, sepsis  EXAM: CT ABDOMEN AND PELVIS WITHOUT CONTRAST  TECHNIQUE: Multidetector CT imaging of the abdomen and pelvis was performed following the standard protocol without IV contrast.  COMPARISON:  08/30/2014  FINDINGS: Lung bases are unremarkable. Atherosclerotic calcifications of coronary arteries.  Sagittal images of the spine shows degenerative changes thoracolumbar spine. The study is limited without IV contrast. Unenhanced liver shows no biliary ductal dilatation. Atherosclerotic calcifications of abdominal aorta and splenic artery. No aortic aneurysm.  Unenhanced pancreas, spleen and adrenal glands are unremarkable. Unenhanced kidneys are symmetrical in size. Bilateral multiple renal cysts are again noted. No nephrolithiasis. No hydronephrosis or hydroureter. No calcified ureteral calculi are noted. No calcified calculi are noted within urinary bladder. Prostate gland and seminal vesicles are unremarkable.  No  small bowel obstruction. Moderate gas and stool noted in transverse colon. Colonic diverticula are noted in descending colon and sigmoid colon. There is no evidence of acute diverticulitis. Moderate gas noted in mid sigmoid colon. Colonic ileus or partial colonic obstruction cannot be excluded.  There is no pericecal inflammation. The terminal ileum is unremarkable.  Again noted postsurgical changes anterior abdominal wall. Again noted status post umbilical hernia repair. Significant scarring in umbilical region. There is high density material within scar and there is high-density material at the level of anterior aspect of the scar/wound see axial image 80. 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 Clinical correlation is necessary. There is no evidence of drainable abscess.  IMPRESSION: 1. Again noted postsurgical changes anterior abdominal wall. Again noted status post umbilical hernia repair. Significant scarring in umbilical region. There is linear high density material within scar and there is high-density material at the level of anterior aspect of the scar/wound see axial image 80. 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 Clinical correlation is necessary. There is no evidence of drainable abscess.  2. Colonic diverticula are noted descending colon and sigmoid colon. No evidence of acute diverticulitis.  3.  No pericecal inflammation.  4. Stable  bilateral renal cysts. No hydronephrosis or hydroureter. No small bowel obstruction.  5. Moderate gas noted within mid sigmoid colon. Colonic ileus or partial colonic obstruction cannot be excluded.   Electronically Signed   By: Lahoma Crocker M.D.   On: 07/01/2015 19:20   Dg Chest 2 View  07/01/2015   CLINICAL DATA:  69 year old male with 2 day history of shortness of breath  EXAM: CHEST  2 VIEW  COMPARISON:  Prior chest x-ray 06/12/2015  FINDINGS: Patient is status post median sternotomy with evidence of multivessel CABG  including LIMA bypass. Atherosclerotic calcification present in the transverse aorta. Similar appearance of low inspiratory volumes with bibasilar atelectasis and pulmonary vascular congestion. Overall, the appearance of the chest is unchanged. There may be trace bilateral pleural effusions. Diffuse bronchial wall thickening again noted. No acute osseous abnormality.  IMPRESSION: Very similar appearance of the chest compared to 06/12/2015 with pulmonary vascular congestion bordering on mild interstitial edema and trace bilateral effusions.  Inspiratory volumes remain low with bibasilar atelectasis.   Electronically Signed   By: Jacqulynn Cadet M.D.   On: 07/01/2015 17:24   Dg Wrist Complete Right  07/01/2015   CLINICAL DATA:  Right wrist pain for 1 day  EXAM: RIGHT WRIST - COMPLETE 3+ VIEW  COMPARISON:  None.  FINDINGS: Four views of the right wrist submitted. No acute fracture or subluxation. Mild degenerative changes first carpometacarpal joint. Atherosclerotic calcifications of radial artery. Probable old avulsion fracture of triquetrum.  IMPRESSION: No acute fracture or subluxation.  Mild degenerative changes   Electronically Signed   By: Lahoma Crocker M.D.   On: 07/01/2015 17:29   Dg Hand Complete Right  07/01/2015   CLINICAL DATA:  Right wrist pain. Hand pain. No known injury. Gout.  EXAM: RIGHT HAND - COMPLETE 3+ VIEW  COMPARISON:  None.  FINDINGS: Soft tissue structures are unremarkable. Diffuse mild degenerative change. Periarticular erosions are noted about the capitate, base of the proximal phalanx of the right second digit, distal aspect of the proximal phalanx of the right second digit, and proximal aspect of the middle phalanx of the right second digit. These findings suggest possibility of gout or other crystal deposition disease. Other inflammatory arthropathies cannot be completely excluded. No evidence of fracture or dislocation.  IMPRESSION: 1. Periarticular erosions about the capitate, the  second MCP and second PIP joints. These changes suggest the possibility of gout. Other crystal induced or inflammatory arthritis cannot be excluded. 2. Diffuse degenerative change. 3. Peripheral vascular disease.   Electronically Signed   By: Marcello Moores  Register   On: 07/01/2015 17:26   I have personally reviewed and evaluated these images and lab results as part of my medical decision-making.   EKG Interpretation   Date/Time:  Tuesday July 01 2015 16:07:41 EDT Ventricular Rate:  89 PR Interval:    QRS Duration: 89 QT Interval:  395 QTC Calculation: 481 R Axis:   139 Text Interpretation:  Atrial fibrillation Right axis deviation Borderline  low voltage, extremity leads Minimal ST depression, inferior leads  Borderline prolonged QT interval Artifact in lead(s) I III aVR aVL Rate  faster Confirmed by Wyvonnia Dusky  MD, STEPHEN [id number]) on 07/01/2015 4:23:20 PM      MDM   Final diagnoses:  Sepsis, due to unspecified organism  SOB (shortness of breath)     Patient is a 69 year old male with a history of diabetes, CHF, CK D, COPD, gout that presents with one day of right hand pain, left leg pain, diffuse abdominal  tenderness, shortness of breath, confusion, decreased urinary output. On arrival to the ED the patient was short of breath on room air and placed on 2 L nasal cannula which he uses at night however never during the day. On exam patient has significant crackles to bilateral lung bases. Patient has 4+ lower extremity edema with a open wound to his left lower extremity after hitting on the wheelchair yesterday that appears to be erythematous and warm concerning for cellulitis. Patient's right wrist is tender and warm as well and patient states this feels very similar to his gout flares in the past. Patient has a tender abdomen diffusely and he recently had bowel reconstruction surgery August 22 from a incarcerated umbilical hernia one year ago that required exploratory laparoscopy. Given the  patient's multiple issues and inability to care for himself at home patient will be worked up extensively and will require admission for multiple reasons. The caregiver states the patient has not urinated since 9:30 this morning and takes 160 mg of Lasix daily. We will get a bladder scan and Place Foley.  We'll the patient was in ED a rectal temp was performed which was 102.5. Patient was brought now to a full septic workup to include blood cultures and broad-spectrum antibiotics.  Patient's lactic acid was elevated. Patient will be given 1 L fluid however we will not do the full 30 mg/kg due to the patient's volume overloaded status. We'll continue to reassess and fluid resuscitate as needed. Patient will be admitted to stepdown unit.  Renne Musca, MD 07/01/15 0569  Ezequiel Essex, MD 07/02/15 1311

## 2015-07-01 NOTE — ED Notes (Signed)
Bladder scanned pt post void- pt had 0 CC remaining in bladder.

## 2015-07-01 NOTE — ED Notes (Addendum)
Pt sts right wrist and right foot and leg pain starting this am; pt denies obvious injury; pt with bilateral leg swelling

## 2015-07-01 NOTE — ED Notes (Signed)
[name] [phone] Pt daughter

## 2015-07-02 ENCOUNTER — Telehealth: Payer: Self-pay | Admitting: Physician Assistant

## 2015-07-02 ENCOUNTER — Telehealth: Payer: Self-pay | Admitting: Cardiovascular Disease

## 2015-07-02 DIAGNOSIS — A419 Sepsis, unspecified organism: Principal | ICD-10-CM

## 2015-07-02 DIAGNOSIS — L0390 Cellulitis, unspecified: Secondary | ICD-10-CM

## 2015-07-02 LAB — COMPREHENSIVE METABOLIC PANEL
ALBUMIN: 2.4 g/dL — AB (ref 3.5–5.0)
ALT: 7 U/L — AB (ref 17–63)
ANION GAP: 6 (ref 5–15)
AST: 12 U/L — ABNORMAL LOW (ref 15–41)
Alkaline Phosphatase: 49 U/L (ref 38–126)
BUN: 33 mg/dL — ABNORMAL HIGH (ref 6–20)
CHLORIDE: 101 mmol/L (ref 101–111)
CO2: 30 mmol/L (ref 22–32)
Calcium: 8.2 mg/dL — ABNORMAL LOW (ref 8.9–10.3)
Creatinine, Ser: 1.34 mg/dL — ABNORMAL HIGH (ref 0.61–1.24)
GFR calc non Af Amer: 52 mL/min — ABNORMAL LOW (ref >60)
GFR, EST AFRICAN AMERICAN: 60 mL/min — AB (ref >60)
Glucose, Bld: 118 mg/dL — ABNORMAL HIGH (ref 65–99)
Potassium: 4.2 mmol/L (ref 3.5–5.1)
SODIUM: 137 mmol/L (ref 135–145)
Total Bilirubin: 0.9 mg/dL (ref 0.3–1.2)
Total Protein: 5.7 g/dL — ABNORMAL LOW (ref 6.5–8.1)

## 2015-07-02 LAB — GLUCOSE, CAPILLARY
GLUCOSE-CAPILLARY: 70 mg/dL (ref 65–99)
Glucose-Capillary: 84 mg/dL (ref 65–99)
Glucose-Capillary: 112 mg/dL — ABNORMAL HIGH (ref 65–99)

## 2015-07-02 LAB — PHOSPHORUS: PHOSPHORUS: 4.2 mg/dL (ref 2.5–4.6)

## 2015-07-02 LAB — LACTIC ACID, PLASMA: LACTIC ACID, VENOUS: 0.7 mmol/L (ref 0.5–2.0)

## 2015-07-02 LAB — MAGNESIUM: Magnesium: 1.4 mg/dL — ABNORMAL LOW (ref 1.7–2.4)

## 2015-07-02 MED ORDER — ENOXAPARIN SODIUM 60 MG/0.6ML ~~LOC~~ SOLN
50.0000 mg | Freq: Every day | SUBCUTANEOUS | Status: DC
  Administered 2015-07-02: 50 mg via SUBCUTANEOUS
  Filled 2015-07-02: qty 0.6

## 2015-07-02 MED ORDER — MAGNESIUM OXIDE 400 (241.3 MG) MG PO TABS
400.0000 mg | ORAL_TABLET | Freq: Two times a day (BID) | ORAL | Status: DC
  Administered 2015-07-02 – 2015-07-07 (×11): 400 mg via ORAL
  Filled 2015-07-02 (×10): qty 1

## 2015-07-02 MED ORDER — OXYCODONE HCL 5 MG PO TABS
5.0000 mg | ORAL_TABLET | ORAL | Status: AC | PRN
  Administered 2015-07-02 (×2): 5 mg via ORAL
  Filled 2015-07-02 (×2): qty 1

## 2015-07-02 MED ORDER — CARVEDILOL 3.125 MG PO TABS
3.1250 mg | ORAL_TABLET | Freq: Two times a day (BID) | ORAL | Status: DC
  Administered 2015-07-02 – 2015-07-04 (×4): 3.125 mg via ORAL
  Filled 2015-07-02 (×4): qty 1

## 2015-07-02 NOTE — Telephone Encounter (Signed)
Called Pt's daughter [name] and advised to let us know tomorrow if they need to cancel the Friday  appt.

## 2015-07-02 NOTE — Progress Notes (Signed)
Patient ID: [name], male   DOB: [birth year], 69 y.o.   MRN: [id number]   The patient does not have a fistula.  What is being seen superficially in the wound is probably the silver nitrate that I applied there in the office yesterday.  The CT scan was done after that. Clinically, he does not have a fistula.

## 2015-07-02 NOTE — H&P (Signed)
[facility] History and Physical  [name] WLS:[id number] DOB: 04/10/1946 DOA: 07/01/2015  Referring physician: Renne Musca, MD PCP: Jene Every, MD   Chief Complaint: Arm and leg pain.  HPI: [name] is a 69 y.o. male with a past medical history of COPD, type 2 diabetes hyperlipidemia, hypertension, hypothyroidism, chronic back pain, CAD, CHF, atrial fibrillation, GERD, depression, anxiety who comes to the hospital due to arm and leg pain. Patient has cellulitic lesions on lower extremities and abdominal wall surgical wound from August 22 incarcerated umbilical hernia repair. He was found to be febrile and hypotensive in the emergency department so a code sepsis was called. Patient also had urinary retention which resolved after Foley placement.  Patient is mildly confused and not a very good historian, but he is clinically better at this time.   Review of Systems:  Constitutional:  Positive Fevers, chills, fatigue. No weight loss, night sweats, .  HEENT:  No headaches, Difficulty swallowing,Tooth/dental problems,Sore throat,  No sneezing, itching, ear ache, nasal congestion, post nasal drip,  Cardio-vascular:  No chest pain, Orthopnea, PND, swelling in lower extremities, anasarca, dizziness, palpitations  GI:  Positive abdominal pain No heartburn, indigestion, , nausea, vomiting, diarrhea, change in bowel habits, loss of appetite  Resp:  Positive shortness of breath with exertion or at rest.  No excess mucus, no productive cough, No non-productive cough, No coughing up of blood.No change in color of mucus.No wheezing.No chest wall deformity  Skin:  no rash or lesions.  GU:  no dysuria, change in color of urine, no urgency or frequency. No flank pain.  Musculoskeletal:  Positive joint pain or swelling. No decreased range of motion. No back pain.  Psych:  Mild confusion. No change in mood or affect. No depression or anxiety. No memory loss.   Past Medical  History  Diagnosis Date  . CHRONIC OBSTRUCTIVE PULMONARY DISEASE 06/20/2009  . CAROTID STENOSIS 06/20/2009    A. 08/2001 s/p L CEA;  B.   09/14/11 - Carotid U/S - 40-59% bilateral stenosis, left CEA patch angioplasty is patent  . DM 06/20/2009  . CAD 06/20/2009    A.  08/2000 - s/p CABG x 4 - LIMA-LAD, Left Radial-OM, VG-DIAG, VG-RCA;  B. Neg. MV  2010  . HYPERLIPIDEMIA 06/20/2009  . HYPERTENSION 06/20/2009  . Hypothyroidism   . Low back pain   . Asthma     as child  . Pneumonia   . Persistent atrial fibrillation     Not felt to be coumadin candidate 2/2 ETOH use.  Marland Kitchen History of tobacco abuse     remote - quit 1970  . Morbidly obese   . Chronic diastolic heart failure, NYHA class 3     Followed by [facility] Clinic --> Echo 04/2014: EF 81-15%, Diastolic DysFxn - elevated LVEDP & LAP. Mod Dil LA & RA.  . Falls frequently   . Hx of cardiovascular stress test 05/2014    Lexiscan Myoview (8/15):  No ischemia; EF 63% - Normal Study  . Breast cancer, right breast   . Gout   . OBSTRUCTIVE SLEEP APNEA 06/20/2009    does not use cpap  . Stroke     found on MRI in Oct. 2015.   Marland Kitchen Shortness of breath dyspnea     occasional  . Depression     takes Prozac  . GERD (gastroesophageal reflux disease)     takes Prilosec  . Arthritis   . Neuromuscular disorder     neuropathy in  both legs  . Constipation   . ETOH abuse     no alcohol since Oct. 2015  . 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 CHF (congestive heart failure)   . Supplemental oxygen dependent     hs  . Bilateral renal cysts   . Kidney disease     Stage 3   Past Surgical History  Procedure Laterality Date  . Carotid endarterectomy  2002    left  . Coronary artery bypass graft      x 4 - 2001  . Umbilical hernia repair N/A 01/28/2014    Procedure: HERNIA REPAIR UMBILICAL ADULT/INC;  Surgeon: Harl Bowie, MD;  Location: [place];  Service: General;  Laterality: N/A;  . Laparotomy  N/A 01/28/2014    Procedure: EXPLORATORY LAPAROTOMY;  Surgeon: Harl Bowie, MD;  Location: [place];  Service: General;  Laterality: N/A;  . Bowel resection N/A 01/28/2014    Procedure: SMALL BOWEL RESECTION;  Surgeon: Harl Bowie, MD;  Location: [place];  Service: General;  Laterality: N/A;  . Hernia repair    . Colonoscopy    . Total mastectomy Right 11/11/2014    Procedure: RIGHT TOTAL MASTECTOMY;  Surgeon: Excell Seltzer, MD;  Location: [place];  Service: General;  Laterality: Right;  . Cataract extraction w/phaco Left 04/08/2015    Procedure: CATARACT EXTRACTION PHACO AND INTRAOCULAR LENS PLACEMENT ([place]);  Surgeon: Birder Robson, MD;  Location: [facility];  Service: Ophthalmology;  Laterality: Left;  [place] 00:36 AP% 23.7 CDE 8.72 fluid pack [id number] H  . Eye surgery Left     Catarct  . Laparotomy N/A 06/02/2015    Procedure: EXPLORATORY LAPAROTOMY;  Surgeon: Coralie Keens, MD;  Location: [place];  Service: General;  Laterality: N/A;  . Bowel resection N/A 06/02/2015    Procedure: SMALL BOWEL RESECTION;  Surgeon: Coralie Keens, MD;  Location: [place];  Service: General;  Laterality: N/A;   Social History:  reports that he quit smoking about 46 years ago. He has never used smokeless tobacco. He reports that he drinks alcohol. He reports that he uses illicit drugs (Marijuana).  Allergies  Allergen Reactions  . Erythromycin Hives    Oramycin    Family History  Problem Relation Age of Onset  . Stroke Mother     ?  Marland Kitchen Cancer Mother     unknown type of cancer  . Stroke Father     ?  Marland Kitchen Heart attack Neg Hx   . Cancer Brother 63    stomach cancer    Prior to Admission medications   Medication Sig Start Date End Date Taking? Authorizing Provider  albuterol-ipratropium (COMBIVENT) 18-103 MCG/ACT inhaler Inhale 2 puffs into the lungs 2 (two) times daily. 06/18/15  Yes Ivan Anchors Love, PA-C  allopurinol (ZYLOPRIM) 300 MG tablet Take 1 tablet (300 mg total) by mouth daily. 08/02/14   Yes Daniel J Angiulli, PA-C  aspirin EC 325 MG EC tablet Take 1 tablet (325 mg total) by mouth daily. 08/02/14  Yes Daniel J Angiulli, PA-C  atorvastatin (LIPITOR) 20 MG tablet Take 1 tablet (20 mg total) by mouth daily. 08/02/14  Yes Daniel J Angiulli, PA-C  carvedilol (COREG) 3.125 MG tablet Take 1 tablet (3.125 mg total) by mouth 2 (two) times daily with a meal. 06/18/15  Yes Ivan Anchors Love, PA-C  colchicine 0.6 MG tablet Take 0.6 mg by mouth daily. 01/13/15  Yes Historical Provider, MD  FLUoxetine (  PROZAC) 20 MG capsule Take 1 capsule (20 mg total) by mouth daily. 06/18/15  Yes Ivan Anchors Love, PA-C  fluticasone (FLOVENT HFA) 110 MCG/ACT inhaler Inhale 2 puffs into the lungs 2 (two) times daily.   Yes Historical Provider, MD  folic acid (FOLVITE) 1 MG tablet Take 1 tablet (1 mg total) by mouth daily. 08/02/14  Yes Daniel J Angiulli, PA-C  furosemide (LASIX) 80 MG tablet Take 1 tablet (80 mg total) by mouth as directed. 160 MG IN THE MORNING AND 80 MG IN THE PM Patient taking differently: Take 80-160 mg by mouth 2 (two) times daily. 160 MG IN THE MORNING AND 80 MG IN THE PM 06/25/15  Yes Scott Joylene Draft, PA-C  gabapentin (NEURONTIN) 300 MG capsule Take 1 capsule (300 mg total) by mouth 2 (two) times daily. 08/02/14  Yes Daniel J Angiulli, PA-C  glimepiride (AMARYL) 1 MG tablet Take 1 tablet (1 mg total) by mouth daily with breakfast. 06/18/15  Yes Ivan Anchors Love, PA-C  levothyroxine (SYNTHROID, LEVOTHROID) 125 MCG tablet Take 1.5 tablets (187 mcg total) by mouth daily before breakfast. 08/02/14  Yes Daniel J Angiulli, PA-C  magnesium oxide (MAG-OX) 400 (241.3 MG) MG tablet Take 1 tablet (400 mg total) by mouth 2 (two) times daily. 06/26/15  Yes Scott Joylene Draft, PA-C  omeprazole (PRILOSEC) 20 MG capsule Take 1 capsule (20 mg total) by mouth daily. 08/02/14  Yes Daniel J Angiulli, PA-C  Oxycodone HCl 10 MG TABS Take 10 mg by mouth 3 (three) times daily as needed (pain).   Yes Historical Provider, MD  potassium  chloride SA (KLOR-CON M20) 20 MEQ tablet Take 1 tablet (20 mEq total) by mouth as directed. [place] PM Patient taking differently: Take 20-40 mEq by mouth 2 (two) times daily. [place] MORNING AND 20 MEQ IN THE PM 06/25/15  Yes Scott Joylene Draft, PA-C  tamoxifen (NOLVADEX) 20 MG tablet Take 1 tablet (20 mg total) by mouth daily. Patient taking differently: Take 20 mg by mouth at bedtime.  09/09/14  Yes Nicholas Lose, MD  venlafaxine XR (EFFEXOR-XR) 37.5 MG 24 hr capsule TAKE ONE CAPSULE BY MOUTH DAILY 02/24/15  Yes Nicholas Lose, MD  oxyCODONE (OXY IR/ROXICODONE) 5 MG immediate release tablet Take 1 tablet (5 mg total) by mouth every 6 (six) hours as needed for moderate pain. Patient not taking: Reported on 07/01/2015 06/18/15   Bary Leriche, PA-C   Physical Exam: Filed Vitals:   07/01/15 2045 07/01/15 2200 07/01/15 2300 07/01/15 2325  BP: 96/41 116/51 120/66 120/66  Pulse:  66 74 79  Temp:  97.5 F (36.4 C)  98.1 F (36.7 C)  TempSrc:  Oral  Oral  Resp:  21 19 24   SpO2: 95% 98% 98% 96%    Wt Readings from Last 3 Encounters:  06/25/15 113 kg (249 lb 1.9 oz)  06/18/15 110.542 kg (243 lb 11.2 oz)  06/05/15 120.3 kg (265 lb 3.4 oz)    General:  Appears calm and comfortable Eyes: PERRL, normal lids, irises & conjunctiva ENT: grossly normal hearing, lips & tongue are mildly dry Neck: no LAD, masses or thyromegaly Cardiovascular: Irregularly irregular. 3+ LE edema. Telemetry: Atrial fibrillation Respiratory: CTA bilaterally anterior exam, no w/r/r. Normal respiratory effort. Abdomen: Right periumbilical area surgical wound with dressing, soft, mild diffuse abdominal wall tenderness without guarding or rebound tenderness. Skin: no rash or induration seen on limited exam Musculoskeletal: grossly normal tone BUE/BLE Psychiatric:  grossly normal mood and affect, speech fluent and appropriate Neurologic: Alert oriented 2, partially oriented to time, grossly  non-focal.          Labs on Admission:  Basic Metabolic Panel:  Recent Labs Lab 06/25/15 1223 07/01/15 1239  NA 138 138  K 4.9 4.6  CL 99 98*  CO2 33* 28  GLUCOSE 98 156*  BUN 50* 33*  CREATININE 1.42 1.26*  CALCIUM 9.2 9.5  MG 1.5  --    CBC:  Recent Labs Lab 07/01/15 1239 07/01/15 1850  WBC 12.7* 13.3*  NEUTROABS 10.8* 11.1*  HGB 11.9* 10.3*  HCT 38.9* 33.8*  MCV 103.2* 102.4*  PLT 120* 114*     Recent Labs  06/12/15 1112 06/13/15 0445 07/01/15 1627  BNP 474.3* 346.5* 331.3*    ProBNP (last 3 results)  Recent Labs  07/21/14 1300  PROBNP 3542.0*     Radiological Exams on Admission: Ct Abdomen Pelvis Wo Contrast  07/01/2015   CLINICAL DATA:  Abdominal pain, sepsis  EXAM: CT ABDOMEN AND PELVIS WITHOUT CONTRAST  TECHNIQUE: Multidetector CT imaging of the abdomen and pelvis was performed following the standard protocol without IV contrast.  COMPARISON:  08/30/2014  FINDINGS: Lung bases are unremarkable. Atherosclerotic calcifications of coronary arteries.  Sagittal images of the spine shows degenerative changes thoracolumbar spine. The study is limited without IV contrast. Unenhanced liver shows no biliary ductal dilatation. Atherosclerotic calcifications of abdominal aorta and splenic artery. No aortic aneurysm.  Unenhanced pancreas, spleen and adrenal glands are unremarkable. Unenhanced kidneys are symmetrical in size. Bilateral multiple renal cysts are again noted. No nephrolithiasis. No hydronephrosis or hydroureter. No calcified ureteral calculi are noted. No calcified calculi are noted within urinary bladder. Prostate gland and seminal vesicles are unremarkable.  No small bowel obstruction. Moderate gas and stool noted in transverse colon. Colonic diverticula are noted in descending colon and sigmoid colon. There is no evidence of acute diverticulitis. Moderate gas noted in mid sigmoid colon. Colonic ileus or partial colonic obstruction cannot be excluded.   There is no pericecal inflammation. The terminal ileum is unremarkable.  Again noted postsurgical changes anterior abdominal wall. Again noted status post umbilical hernia repair. Significant scarring in umbilical region. There is high density material within scar and there is high-density material at the level of anterior aspect of the scar/wound see axial image 80. 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 Clinical correlation is necessary. There is no evidence of drainable abscess.  IMPRESSION: 1. Again noted postsurgical changes anterior abdominal wall. Again noted status post umbilical hernia repair. Significant scarring in umbilical region. There is linear high density material within scar and there is high-density material at the level of anterior aspect of the scar/wound see axial image 80. 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 Clinical correlation is necessary. There is no evidence of drainable abscess.  2. Colonic diverticula are noted descending colon and sigmoid colon. No evidence of acute diverticulitis.  3.  No pericecal inflammation.  4. Stable bilateral renal cysts. No hydronephrosis or hydroureter. No small bowel obstruction.  5. Moderate gas noted within mid sigmoid colon. Colonic ileus or partial colonic obstruction cannot be excluded.   Electronically Signed   By: Lahoma Crocker M.D.   On: 07/01/2015 19:20   Dg Chest 2 View  07/01/2015   CLINICAL DATA:  69 year old male with 2 day history of shortness of breath  EXAM: CHEST  2 VIEW  COMPARISON:  Prior chest x-ray 06/12/2015  FINDINGS: Patient is status post  median sternotomy with evidence of multivessel CABG including LIMA bypass. Atherosclerotic calcification present in the transverse aorta. Similar appearance of low inspiratory volumes with bibasilar atelectasis and pulmonary vascular congestion. Overall, the appearance of the chest is unchanged. There may be trace bilateral pleural effusions. Diffuse  bronchial wall thickening again noted. No acute osseous abnormality.  IMPRESSION: Very similar appearance of the chest compared to 06/12/2015 with pulmonary vascular congestion bordering on mild interstitial edema and trace bilateral effusions.  Inspiratory volumes remain low with bibasilar atelectasis.   Electronically Signed   By: Jacqulynn Cadet M.D.   On: 07/01/2015 17:24   Dg Wrist Complete Right  07/01/2015   CLINICAL DATA:  Right wrist pain for 1 day  EXAM: RIGHT WRIST - COMPLETE 3+ VIEW  COMPARISON:  None.  FINDINGS: Four views of the right wrist submitted. No acute fracture or subluxation. Mild degenerative changes first carpometacarpal joint. Atherosclerotic calcifications of radial artery. Probable old avulsion fracture of triquetrum.  IMPRESSION: No acute fracture or subluxation.  Mild degenerative changes   Electronically Signed   By: Lahoma Crocker M.D.   On: 07/01/2015 17:29   Dg Hand Complete Right  07/01/2015   CLINICAL DATA:  Right wrist pain. Hand pain. No known injury. Gout.  EXAM: RIGHT HAND - COMPLETE 3+ VIEW  COMPARISON:  None.  FINDINGS: Soft tissue structures are unremarkable. Diffuse mild degenerative change. Periarticular erosions are noted about the capitate, base of the proximal phalanx of the right second digit, distal aspect of the proximal phalanx of the right second digit, and proximal aspect of the middle phalanx of the right second digit. These findings suggest possibility of gout or other crystal deposition disease. Other inflammatory arthropathies cannot be completely excluded. No evidence of fracture or dislocation.  IMPRESSION: 1. Periarticular erosions about the capitate, the second MCP and second PIP joints. These changes suggest the possibility of gout. Other crystal induced or inflammatory arthritis cannot be excluded. 2. Diffuse degenerative change. 3. Peripheral vascular disease.   Electronically Signed   By: Marcello Moores  Register   On: 07/01/2015 17:26     Echocardiogram: 06/20/2015. ------------------------------------------------------------------- LV EF: 60% -  65%  ------------------------------------------------------------------- Indications:   CHF - 428.0.  ------------------------------------------------------------------- History:  PMH: Chronic diastolic heart failure. Paroxysmal atrial fibrillation. Debility. Coronary artery disease. Chronic obstructive pulmonary disease. Risk factors: Hypertension. Diabetes mellitus. Dyslipidemia.  ------------------------------------------------------------------- Study Conclusions  - Left ventricle: The cavity size was normal. Systolic function was normal. The estimated ejection fraction was in the range of 60% to 65%. Wall motion was normal; there were no regional wall motion abnormalities. - Aortic valve: Valve mobility was restricted. There was mild stenosis. Peak velocity (S): 248 cm/s. - Mitral valve: Calcified annulus. Mildly thickened, mildly calcified leaflets . - Left atrium: The atrium was moderately dilated. - Right ventricle: The cavity size was mildly dilated. Wall thickness was normal. - Right atrium: The atrium was moderately dilated.   EKG: Independently reviewed.  Vent. rate 89 BPM PR interval * ms QRS duration 89 ms QT/QTc 395/481 ms P-R-T axes -1 139 92 Atrial fibrillation Right axis deviation Borderline low voltage, extremity leads Minimal ST depression, inferior leads Borderline prolonged QT interval Artifact in lead(s) I III aVR aVL  Assessment/Plan Principal Problem:     Sepsis due to cellulitis Admit to stepdown for closer monitoring. Continue IV antibiotic therapy. Careful IV hydration. Follow-up wound and blood cultures. Continue local wound care.  Active Problems:     DM (diabetes mellitus) type II uncontrolled, periph vascular  disorder Continue current treatment. Monitor CBG.      Hyperlipidemia Continue  Lipitor. Monitor LFTs.     PAF (paroxysmal atrial fibrillation) Continue telemetry monitoring. Resume beta blocker once the blood pressure is better.      Hypothyroidism Continue levothyroxine and monitor TSH.    Hypertension Hold antihypertensives at this time and monitor blood pressure. Resume antihypertensive therapy once his blood pressure permits.     Code Status: Full code. DVT Prophylaxis: Lovenox SQ. Family Communication:  Disposition Plan: Admit to a stepdown for close monitoring and IV antibiotic therapy.  Time spent: Over 70 minutes were spent during the process of this admission.  Reubin Milan [facility] Pager [phone].

## 2015-07-02 NOTE — Telephone Encounter (Addendum)
error 

## 2015-07-02 NOTE — Telephone Encounter (Signed)
New message   Pt daughter dana, wants to inform us that pt in   [id number] she do not want to cancel the appointment just yet for Friday

## 2015-07-02 NOTE — Consult Note (Addendum)
WOC wound consult note Reason for Consult: Consult requested for abd wound.  Pt had hernia surgery previously and states he has been using moist gauze packing prior to admission. 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 Measurement: 4X6X3cm Wound bed: 90% red, 10% yellow Drainage (amount, consistency, odor) Small amt yellowish-green tinged drainage, no odor. Periwound: Intact skin surrounding Dressing procedure/placement/frequency: Continue moist gauze packing Q day.  Pt can resume follow-up with his surgeon after discharge. Please re-consult if further assistance is needed.  Thank-you,  [name] Girt MSN, [place], [place], [place], [place]

## 2015-07-02 NOTE — Progress Notes (Signed)
[facility] PROGRESS NOTE  [name] KVQ:[id number] DOB: May 20, 1946 DOA: 07/01/2015  PCP: Jene Every, MD  Brief HPI: 69 year old Caucasian male with a past medical history of COPD, type 2 diabetes, hyperlipidemia, hypertension, diastolic CHF, atrial fibrillation, presented with complains of arm and leg pain. He was found to have cellulitis of his lower extremities. He recently underwent exploratory laparotomy and small bowel resection in August. He is known to have an enterocutaneous fistula and a chronic wound. Patient was found to be septic. He also had urinary retention. He was admitted for further management.  Past medical history:  Past Medical History  Diagnosis Date  . CHRONIC OBSTRUCTIVE PULMONARY DISEASE 06/20/2009  . CAROTID STENOSIS 06/20/2009    A. 08/2001 s/p L CEA;  B.   09/14/11 - Carotid U/S - 40-59% bilateral stenosis, left CEA patch angioplasty is patent  . DM 06/20/2009  . CAD 06/20/2009    A.  08/2000 - s/p CABG x 4 - LIMA-LAD, Left Radial-OM, VG-DIAG, VG-RCA;  B. Neg. MV  2010  . HYPERLIPIDEMIA 06/20/2009  . HYPERTENSION 06/20/2009  . Hypothyroidism   . Low back pain   . Asthma     as child  . Pneumonia   . Persistent atrial fibrillation     Not felt to be coumadin candidate 2/2 ETOH use.  Marland Kitchen History of tobacco abuse     remote - quit 1970  . Morbidly obese   . Chronic diastolic heart failure, NYHA class 3     Followed by [facility] --> Echo 04/2014: EF 64-33%, Diastolic DysFxn - elevated LVEDP & LAP. Mod Dil LA & RA.  . Falls frequently   . Hx of cardiovascular stress test 05/2014    Lexiscan Myoview (8/15):  No ischemia; EF 63% - Normal Study  . Breast cancer, right breast   . Gout   . OBSTRUCTIVE SLEEP APNEA 06/20/2009    does not use cpap  . Stroke     found on MRI in Oct. 2015.   Marland Kitchen Shortness of breath dyspnea     occasional  . Depression     takes Prozac  . GERD (gastroesophageal reflux disease)     takes Prilosec  . Arthritis   .  Neuromuscular disorder     neuropathy in both legs  . Constipation   . ETOH abuse     no alcohol since Oct. 2015  . 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 CHF (congestive heart failure)   . Supplemental oxygen dependent     hs  . Bilateral renal cysts   . Kidney disease     Stage 3    Consultants: None  Procedures: None  Antibiotics: Vancomycin and Zosyn 9/20  Subjective: Patient mildly distracted this morning. Denies any significant abdominal or extremity pain. No nausea, vomiting.   Objective: Vital Signs  Filed Vitals:   07/02/15 0900 07/02/15 0925 07/02/15 1000 07/02/15 1100  BP: 146/68  142/65 144/69  Pulse: 87  83 90  Temp:    99.2 F (37.3 C)  TempSrc:    Oral  Resp: 23  27 18   SpO2: 96% 98% 96% 92%    Intake/Output Summary (Last 24 hours) at 07/02/15 1248 Last data filed at 07/02/15 1112  Gross per 24 hour  Intake 296.17 ml  Output   1125 ml  Net -828.83 ml   There were no vitals filed for this visit.  General appearance: alert,  cooperative, appears stated age and no distress Resp: Diminished air entry at the bases without any crackles or wheezing. No rhonchi. Cardio: regular rate and rhythm, S1, S2 normal, no murmur, click, rub or gallop GI: Abdomen is soft. Mild tenderness over the dressing area. Dressing was removed. There is a wound with some granulation tissue and yellowish exudate. Mild erythema surrounding the wound is noted. Bowel sounds are present. Extremities: Erythema noted bilateral lower extremity with warmth. Wounds are noted. Pulses: 2+ and symmetric Neurologic: No focal deficits. He is mildly distracted.  Lab Results:  Basic Metabolic Panel:  Recent Labs Lab 07/01/15 1239 07/02/15 0240  NA 138 137  K 4.6 4.2  CL 98* 101  CO2 28 30  GLUCOSE 156* 118*  BUN 33* 33*  CREATININE 1.26* 1.34*  CALCIUM 9.5 8.2*  MG  --  1.4*  PHOS  --  4.2   Liver Function Tests:  Recent  Labs Lab 07/02/15 0240  AST 12*  ALT 7*  ALKPHOS 49  BILITOT 0.9  PROT 5.7*  ALBUMIN 2.4*   CBC:  Recent Labs Lab 07/01/15 1239 07/01/15 1850  WBC 12.7* 13.3*  NEUTROABS 10.8* 11.1*  HGB 11.9* 10.3*  HCT 38.9* 33.8*  MCV 103.2* 102.4*  PLT 120* 114*   BNP (last 3 results)  Recent Labs  06/12/15 1112 06/13/15 0445 07/01/15 1627  BNP 474.3* 346.5* 331.3*    CBG: No results for input(s): GLUCAP in the last 168 hours.  Recent Results (from the past 240 hour(s))  Blood culture (routine x 2)     Status: None (Preliminary result)   Collection Time: 07/01/15  4:27 PM  Result Value Ref Range Status   Specimen Description BLOOD LEFT ARM  Final   Special Requests BOTTLES DRAWN AEROBIC AND ANAEROBIC 5ML  Final   Culture NO GROWTH < 24 HOURS  Final   Report Status PENDING  Incomplete  Urine culture     Status: None (Preliminary result)   Collection Time: 07/01/15  4:55 PM  Result Value Ref Range Status   Specimen Description URINE, RANDOM  Final   Special Requests NONE  Final   Culture NO GROWTH < 24 HOURS  Final   Report Status PENDING  Incomplete  Blood culture (routine x 2)     Status: None (Preliminary result)   Collection Time: 07/01/15  6:44 PM  Result Value Ref Range Status   Specimen Description BLOOD LEFT HAND  Final   Special Requests BOTTLES DRAWN AEROBIC AND ANAEROBIC 3CC  Final   Culture NO GROWTH < 24 HOURS  Final   Report Status PENDING  Incomplete  MRSA PCR Screening     Status: None   Collection Time: 07/01/15 10:12 PM  Result Value Ref Range Status   MRSA by PCR NEGATIVE NEGATIVE Final    Comment:        The GeneXpert MRSA Assay (FDA approved for NASAL specimens only), is one component of a comprehensive MRSA colonization surveillance program. It is not intended to diagnose MRSA infection nor to guide or monitor treatment for MRSA infections.       Studies/Results: Ct Abdomen Pelvis Wo Contrast  07/01/2015   CLINICAL DATA:  Abdominal  pain, sepsis  EXAM: CT ABDOMEN AND PELVIS WITHOUT CONTRAST  TECHNIQUE: Multidetector CT imaging of the abdomen and pelvis was performed following the standard protocol without IV contrast.  COMPARISON:  08/30/2014  FINDINGS: Lung bases are unremarkable. Atherosclerotic calcifications of coronary arteries.  Sagittal images of the spine shows  degenerative changes thoracolumbar spine. The study is limited without IV contrast. Unenhanced liver shows no biliary ductal dilatation. Atherosclerotic calcifications of abdominal aorta and splenic artery. No aortic aneurysm.  Unenhanced pancreas, spleen and adrenal glands are unremarkable. Unenhanced kidneys are symmetrical in size. Bilateral multiple renal cysts are again noted. No nephrolithiasis. No hydronephrosis or hydroureter. No calcified ureteral calculi are noted. No calcified calculi are noted within urinary bladder. Prostate gland and seminal vesicles are unremarkable.  No small bowel obstruction. Moderate gas and stool noted in transverse colon. Colonic diverticula are noted in descending colon and sigmoid colon. There is no evidence of acute diverticulitis. Moderate gas noted in mid sigmoid colon. Colonic ileus or partial colonic obstruction cannot be excluded.  There is no pericecal inflammation. The terminal ileum is unremarkable.  Again noted postsurgical changes anterior abdominal wall. Again noted status post umbilical hernia repair. Significant scarring in umbilical region. There is high density material within scar and there is high-density material at the level of anterior aspect of the scar/wound see axial image 80. 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 Clinical correlation is necessary. There is no evidence of drainable abscess.  IMPRESSION: 1. Again noted postsurgical changes anterior abdominal wall. Again noted status post umbilical hernia repair. Significant scarring in umbilical region. There is linear high density material  within scar and there is high-density material at the level of anterior aspect of the scar/wound see axial image 80. 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 Clinical correlation is necessary. There is no evidence of drainable abscess.  2. Colonic diverticula are noted descending colon and sigmoid colon. No evidence of acute diverticulitis.  3.  No pericecal inflammation.  4. Stable bilateral renal cysts. No hydronephrosis or hydroureter. No small bowel obstruction.  5. Moderate gas noted within mid sigmoid colon. Colonic ileus or partial colonic obstruction cannot be excluded.   Electronically Signed   By: Lahoma Crocker M.D.   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 Atherosclerotic calcification present in the transverse aorta. Similar appearance of low inspiratory volumes with bibasilar atelectasis and pulmonary vascular congestion. Overall, the appearance of the chest is unchanged. There may be trace bilateral pleural effusions. Diffuse bronchial wall thickening again noted. No acute osseous abnormality.  IMPRESSION: Very similar appearance of the chest compared to 06/12/2015 with pulmonary vascular congestion bordering on mild interstitial edema and trace bilateral effusions.  Inspiratory volumes remain low with bibasilar atelectasis.   Electronically Signed   By: Jacqulynn Cadet M.D.   On: 07/01/2015 17:24   Dg Wrist Complete Right  07/01/2015   CLINICAL DATA:  Right wrist pain for 1 day  EXAM: RIGHT WRIST - COMPLETE 3+ VIEW  COMPARISON:  None.  FINDINGS: Four views of the right wrist submitted. No acute fracture or subluxation. Mild degenerative changes first carpometacarpal joint. Atherosclerotic calcifications of radial artery.  Probable old avulsion fracture of triquetrum.  IMPRESSION: No acute fracture or subluxation.  Mild degenerative changes   Electronically Signed   By: Lahoma Crocker M.D.   On: 07/01/2015 17:29   Dg Hand Complete Right  07/01/2015   CLINICAL DATA:  Right wrist pain. Hand pain. No known injury. Gout.  EXAM: RIGHT HAND - COMPLETE 3+ VIEW  COMPARISON:  None.  FINDINGS: Soft tissue structures are unremarkable. Diffuse mild degenerative change. Periarticular erosions are noted about the capitate, base of the proximal phalanx of the right second digit, distal aspect of the proximal phalanx of the right second digit, and proximal aspect of the middle phalanx of the right second digit. These findings suggest possibility of gout or other crystal deposition disease. Other inflammatory arthropathies cannot be completely excluded. No evidence of fracture or dislocation.  IMPRESSION: 1. Periarticular erosions about the capitate, the second MCP and second PIP joints. These changes suggest the possibility of gout. Other crystal induced or inflammatory arthritis cannot be excluded. 2. Diffuse degenerative change. 3. Peripheral vascular disease.   Electronically Signed   By: Marcello Moores  Register   On: 07/01/2015 17:26    Medications:  Scheduled: . allopurinol  300 mg Oral Daily  . aspirin EC  325 mg Oral Daily  . atorvastatin  20 mg Oral Daily  . budesonide (PULMICORT) nebulizer solution  0.5 mg Nebulization BID  . colchicine  0.6 mg Oral Daily  . enoxaparin (LOVENOX) injection  30 mg Subcutaneous QHS  . FLUoxetine  20 mg Oral Daily  . folic acid  1 mg Oral Daily  . gabapentin  300 mg Oral BID  . glimepiride  1 mg Oral Q breakfast  . insulin aspart  0-9 Units Subcutaneous TID WC  . levothyroxine  187 mcg Oral QAC breakfast  . magnesium oxide  400 mg Oral BID  . pantoprazole  40 mg Oral Daily  . piperacillin-tazobactam (ZOSYN)  IV  3.375 g Intravenous Q8H  . tamoxifen  20 mg Oral QHS  . vancomycin  1,250 mg Intravenous  Q24H  . venlafaxine XR  37.5 mg Oral Daily   Continuous: . sodium chloride Stopped (07/02/15 0912)   [id number] **OR** ondansetron (ZOFRAN) IV, oxyCODONE  Assessment/Plan:  Principal Problem:   Sepsis due to cellulitis Active Problems:   DM (diabetes mellitus) type II uncontrolled, periph vascular disorder   Hyperlipidemia   PAF (paroxysmal atrial fibrillation)   Hypothyroidism   Hypertension   Sepsis    Sepsis secondary to cellulitis involving lower extremities as well as abdominal wall Patient was admitted to step down. He had borderline low blood pressures initially. He was given IV fluids. Blood pressures have stabilized. Continue with broad-spectrum antibiotics. Await culture reports. Wound care nurse has been consulted. There is likely also an element of chronic venous stasis in the lower extremities. Obtain venous Doppler studies. Will notify general surgery of this hospitalization. He is mildly encephalopathic, probably due to infection.  History of type 2 diabetes mellitus, uncontrolled Continue current regimen. Monitor CBGs.  History of paroxysmal atrial fibrillation Stable. Heart rate is reasonably well controlled. Patient is not on anticoagulation due to history of alcohol use. Patient was noted to be on beta blocker, which we would like to resume as soon as possible.  History of coronary artery disease status post CABG Stable. Denies any chest pain.  History of chronic kidney disease stage III Monitor urine output closely. Follow renal function.  Enterocutaneous fistula Developed over a year ago when he had emergent exploratory laparotomy for incarcerated umbilical hernia. Patient was recently evaluated by general surgery and underwent exploratory laparotomy and small bowel resection. Stable.  History of hypothyroidism Stable. Continue levothyroxine  History of essential hypertension Holding his antihypertensives agents due to low blood pressures.  Continue to monitor for now. Blood pressure has improved.  Acute gout Involving his right wrist, elbow. He is on  allopurinol. He is also on colchicine. Hesitate giving steroids due to active infection. Cannot use NSAIDs due to chronic kidney disease.  History of right breast cancer He is status post right total mastectomy in February 2016. He is on hormonal treatment, which will be continued.  DVT Prophylaxis: Lovenox    Code Status: Full code  Family Communication: No family at bedside  Disposition Plan: Continue current treatment. PT and OT to evaluate.     LOS: 1 day   [facility] Pager [phone] 07/02/2015, 12:48 PM  If 7PM-7AM, please contact night-coverage at [email or web address], password [facility]

## 2015-07-02 NOTE — Care Management Note (Signed)
Case Management Note  Patient Details  Name: [name] MRN: [id number] Date of Birth: 1946/03/24  Subjective/Objective:      Adm w sepsis              Action/Plan: lives w fam, pcp dr Jene Every  Expected Discharge Date:                  Expected Discharge Plan:     In-House Referral:     Discharge planning Services     Post Acute Care Choice:    Choice offered to:     DME Arranged:    DME Agency:     HH Arranged:    [place] Agency:     Status of Service:     Medicare Important Message Given:    Date Medicare IM Given:    Medicare IM give by:    Date Additional Medicare IM Given:    Additional Medicare Important Message give by:     If discussed at [place] of Stay Meetings, dates discussed:    Additional Comments: ur review done  Lacretia Leigh, RN 07/02/2015, 8:14 AM

## 2015-07-02 NOTE — Progress Notes (Signed)
[facility] Progress Note     Subjective: Was asked by Dr. [place] Pink to come see the patient's wound.  He is currently admitted for sepsis from his lower extremity cellulitis and was concern for his chronic abdominal wound.  The patient is confused and not able to answer my questions.  He does not appear to be in pain at his abdominal wound.  He had seen Dr. Ninfa Linden in clinic yesterday and was reportedly complaining of gout symptoms in his arms/legs.  C/o arthralgia and joint swelling.  Objective: Vital signs in last 24 hours: Temp:  [97.5 F (36.4 C)-102.5 F (39.2 C)] 99.2 F (37.3 C) (09/21 1100) Pulse Rate:  [66-101] 101 (09/21 1200) Resp:  [16-27] 24 (09/21 1200) BP: (89-149)/(41-79) 143/66 mmHg (09/21 1200) SpO2:  [75 %-100 %] 95 % (09/21 1200)    Intake/Output from previous day: 09/20 0701 - 09/21 0700 In: 131.2 [I.V.:81.2; IV Piggyback:50] Out: 850 [Urine:850] Intake/Output this shift: Total I/O In: 430 [P.O.:360; I.V.:20; IV Piggyback:50] Out: 550 [Urine:550]  PE: Gen:  Alert, NAD, pleasant Abd: Soft, NT/ND, midline abdominal wound is clean with beefy red tissue, no significant slough, no pus drainage, no significant cellulitis, wound is within a large skin fold   Lab Results:   Recent Labs  07/01/15 1239 07/01/15 1850  WBC 12.7* 13.3*  HGB 11.9* 10.3*  HCT 38.9* 33.8*  PLT 120* 114*   BMET  Recent Labs  07/01/15 1239 07/02/15 0240  NA 138 137  K 4.6 4.2  CL 98* 101  CO2 28 30  GLUCOSE 156* 118*  BUN 33* 33*  CREATININE 1.26* 1.34*  CALCIUM 9.5 8.2*   PT/INR No results for input(s): LABPROT, INR in the last 72 hours. CMP     Component Value Date/Time   NA 137 07/02/2015 0240   NA 138 10/22/2014 1407   K 4.2 07/02/2015 0240   K 3.9 10/22/2014 1407   CL 101 07/02/2015 0240   CO2 30 07/02/2015 0240   CO2 30* 10/22/2014 1407   GLUCOSE 118* 07/02/2015 0240   GLUCOSE 130 10/22/2014 1407   BUN 33* 07/02/2015 0240   BUN 55.3*  10/22/2014 1407   CREATININE 1.34* 07/02/2015 0240   CREATININE 1.4* 10/22/2014 1407   CALCIUM 8.2* 07/02/2015 0240   CALCIUM 8.6 10/22/2014 1407   PROT 5.7* 07/02/2015 0240   PROT 6.6 10/22/2014 1407   ALBUMIN 2.4* 07/02/2015 0240   ALBUMIN 3.2* 10/22/2014 1407   AST 12* 07/02/2015 0240   AST 16 10/22/2014 1407   ALT 7* 07/02/2015 0240   ALT 6 10/22/2014 1407   ALKPHOS 49 07/02/2015 0240   ALKPHOS 68 10/22/2014 1407   BILITOT 0.9 07/02/2015 0240   BILITOT 0.46 10/22/2014 1407   GFRNONAA 52* 07/02/2015 0240   GFRAA 60* 07/02/2015 0240   Lipase     Component Value Date/Time   LIPASE 41 12/25/2012 1452       Studies/Results: Ct Abdomen Pelvis Wo Contrast  07/01/2015   CLINICAL DATA:  Abdominal pain, sepsis  EXAM: CT ABDOMEN AND PELVIS WITHOUT CONTRAST  TECHNIQUE: Multidetector CT imaging of the abdomen and pelvis was performed following the standard protocol without IV contrast.  COMPARISON:  08/30/2014  FINDINGS: Lung bases are unremarkable. Atherosclerotic calcifications of coronary arteries.  Sagittal images of the spine shows degenerative changes thoracolumbar spine. The study is limited without IV contrast. Unenhanced liver shows no biliary ductal dilatation. Atherosclerotic calcifications of abdominal aorta and splenic artery. No aortic aneurysm.  Unenhanced pancreas, spleen  and adrenal glands are unremarkable. Unenhanced kidneys are symmetrical in size. Bilateral multiple renal cysts are again noted. No nephrolithiasis. No hydronephrosis or hydroureter. No calcified ureteral calculi are noted. No calcified calculi are noted within urinary bladder. Prostate gland and seminal vesicles are unremarkable.  No small bowel obstruction. Moderate gas and stool noted in transverse colon. Colonic diverticula are noted in descending colon and sigmoid colon. There is no evidence of acute diverticulitis. Moderate gas noted in mid sigmoid colon. Colonic ileus or partial colonic obstruction  cannot be excluded.  There is no pericecal inflammation. The terminal ileum is unremarkable.  Again noted postsurgical changes anterior abdominal wall. Again noted status post umbilical hernia repair. Significant scarring in umbilical region. There is high density material within scar and there is high-density material at the level of anterior aspect of the scar/wound see axial image 80. 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 Clinical correlation is necessary. There is no evidence of drainable abscess.  IMPRESSION: 1. Again noted postsurgical changes anterior abdominal wall. Again noted status post umbilical hernia repair. Significant scarring in umbilical region. There is linear high density material within scar and there is high-density material at the level of anterior aspect of the scar/wound see axial image 80. 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 Clinical correlation is necessary. There is no evidence of drainable abscess.  2. Colonic diverticula are noted descending colon and sigmoid colon. No evidence of acute diverticulitis.  3.  No pericecal inflammation.  4. Stable bilateral renal cysts. No hydronephrosis or hydroureter. No small bowel obstruction.  5. Moderate gas noted within mid sigmoid colon. Colonic ileus or partial colonic obstruction cannot be excluded.   Electronically Signed   By: Lahoma Crocker M.D.   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 Atherosclerotic calcification present in the transverse aorta. Similar appearance of low inspiratory volumes with bibasilar atelectasis and pulmonary vascular congestion. Overall, the appearance of the chest is unchanged. There may be trace bilateral pleural  effusions. Diffuse bronchial wall thickening again noted. No acute osseous abnormality.  IMPRESSION: Very similar appearance of the chest compared to 06/12/2015 with pulmonary vascular congestion bordering on mild interstitial edema and trace bilateral effusions.  Inspiratory volumes remain low with bibasilar atelectasis.   Electronically Signed   By: Jacqulynn Cadet M.D.   On: 07/01/2015 17:24   Dg Wrist Complete Right  07/01/2015   CLINICAL DATA:  Right wrist pain for 1 day  EXAM: RIGHT WRIST - COMPLETE 3+ VIEW  COMPARISON:  None.  FINDINGS: Four views of the right wrist submitted. No acute fracture or subluxation. Mild degenerative changes first carpometacarpal joint. Atherosclerotic calcifications of radial artery. Probable old avulsion fracture of triquetrum.  IMPRESSION: No acute fracture or subluxation.  Mild degenerative changes   Electronically Signed   By: Lahoma Crocker M.D.   On: 07/01/2015 17:29   Dg Hand Complete Right  07/01/2015   CLINICAL DATA:  Right wrist pain. Hand pain. No known injury. Gout.  EXAM: RIGHT HAND - COMPLETE 3+ VIEW  COMPARISON:  None.  FINDINGS: Soft tissue structures are unremarkable. Diffuse mild degenerative change. Periarticular erosions are noted about the capitate, base of the proximal phalanx of the right second digit, distal  aspect of the proximal phalanx of the right second digit, and proximal aspect of the middle phalanx of the right second digit. These findings suggest possibility of gout or other crystal deposition disease. Other inflammatory arthropathies cannot be completely excluded. No evidence of fracture or dislocation.  IMPRESSION: 1. Periarticular erosions about the capitate, the second MCP and second PIP joints. These changes suggest the possibility of gout. Other crystal induced or inflammatory arthritis cannot be excluded. 2. Diffuse degenerative change. 3. Peripheral vascular disease.   Electronically Signed   By: Marcello Moores  Register   On: 07/01/2015 17:26     Anti-infectives: Anti-infectives    Start     Dose/Rate Route Frequency Ordered Stop   07/02/15 2000  vancomycin (VANCOCIN) 1,250 mg in sodium chloride 0.9 % 250 mL IVPB     1,250 mg 166.7 mL/hr over 90 Minutes Intravenous Every 24 hours 07/01/15 1828     07/02/15 0100  piperacillin-tazobactam (ZOSYN) IVPB 3.375 g     3.375 g 12.5 mL/hr over 240 Minutes Intravenous Every 8 hours 07/01/15 1828     07/01/15 1815  piperacillin-tazobactam (ZOSYN) IVPB 3.375 g     3.375 g 100 mL/hr over 30 Minutes Intravenous  Once 07/01/15 1812 07/01/15 1919   07/01/15 1815  vancomycin (VANCOCIN) IVPB 1000 mg/200 mL premix  Status:  Discontinued     1,000 mg 200 mL/hr over 60 Minutes Intravenous  Once 07/01/15 1812 07/01/15 1814   07/01/15 1815  vancomycin (VANCOCIN) 2,250 mg in sodium chloride 0.9 % 500 mL IVPB     2,250 mg 250 mL/hr over 120 Minutes Intravenous  Once 07/01/15 1814 07/01/15 2117   07/01/15 1745  clindamycin (CLEOCIN) IVPB 900 mg     900 mg 100 mL/hr over 30 Minutes Intravenous  Once 07/01/15 1732 07/01/15 1840       Assessment/Plan 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 Ninfa Linden 06/02/15 Chronic abdominal wound  -Do not suspect this as a source of sepsis as it is very clean  -Continue WD dressing changes to his midline wound twice daily - can be once a day when discharged -Discussed with Dr. Ninfa Linden, he saw him in the office yesterday.  He would be very surprised if the source was abdominal.  He will see him tomorrow in the hospital.       LOS: 1 day    Nat Christen 07/02/2015, 2:32 PM Pager: [phone]

## 2015-07-02 NOTE — Progress Notes (Signed)
Pt attempted to urinate while lying in bed but was unsuccessful. Pt complaining of pain in bladder area related to the inability to void. In and out cath done a few hours prior. Foley huddle done with Trilby Drummer RN and Lurline Hare. Tried to bladder scan pt but machine was unable to pick up volume in bladder. Foley inserted with 100cc initially out. Pt tolerated well and pain resolved. Will continue to monitor.

## 2015-07-03 ENCOUNTER — Inpatient Hospital Stay (HOSPITAL_COMMUNITY): Payer: Medicare Other

## 2015-07-03 ENCOUNTER — Encounter (HOSPITAL_COMMUNITY): Payer: Self-pay | Admitting: *Deleted

## 2015-07-03 DIAGNOSIS — E039 Hypothyroidism, unspecified: Secondary | ICD-10-CM

## 2015-07-03 DIAGNOSIS — E1159 Type 2 diabetes mellitus with other circulatory complications: Secondary | ICD-10-CM

## 2015-07-03 DIAGNOSIS — I10 Essential (primary) hypertension: Secondary | ICD-10-CM

## 2015-07-03 DIAGNOSIS — D696 Thrombocytopenia, unspecified: Secondary | ICD-10-CM

## 2015-07-03 DIAGNOSIS — I480 Paroxysmal atrial fibrillation: Secondary | ICD-10-CM

## 2015-07-03 DIAGNOSIS — R609 Edema, unspecified: Secondary | ICD-10-CM

## 2015-07-03 LAB — BASIC METABOLIC PANEL
Anion gap: 7 (ref 5–15)
BUN: 33 mg/dL — AB (ref 6–20)
CHLORIDE: 100 mmol/L — AB (ref 101–111)
CO2: 30 mmol/L (ref 22–32)
CREATININE: 1.13 mg/dL (ref 0.61–1.24)
Calcium: 8.4 mg/dL — ABNORMAL LOW (ref 8.9–10.3)
GFR calc Af Amer: >60 mL/min (ref >60)
GFR calc non Af Amer: >60 mL/min (ref >60)
Glucose, Bld: 75 mg/dL (ref 65–99)
Potassium: 3.4 mmol/L — ABNORMAL LOW (ref 3.5–5.1)
Sodium: 137 mmol/L (ref 135–145)

## 2015-07-03 LAB — URINE CULTURE

## 2015-07-03 LAB — GLUCOSE, CAPILLARY
GLUCOSE-CAPILLARY: 67 mg/dL (ref 65–99)
GLUCOSE-CAPILLARY: 91 mg/dL (ref 65–99)
Glucose-Capillary: 101 mg/dL — ABNORMAL HIGH (ref 65–99)
Glucose-Capillary: 134 mg/dL — ABNORMAL HIGH (ref 65–99)
Glucose-Capillary: 73 mg/dL (ref 65–99)

## 2015-07-03 LAB — CBC
HEMATOCRIT: 29.2 % — AB (ref 39.0–52.0)
HEMOGLOBIN: 8.9 g/dL — AB (ref 13.0–17.0)
MCH: 31 pg (ref 26.0–34.0)
MCHC: 30.5 g/dL (ref 30.0–36.0)
MCV: 101.7 fL — ABNORMAL HIGH (ref 78.0–100.0)
Platelets: 88 10*3/uL — ABNORMAL LOW (ref 150–400)
RBC: 2.87 MIL/uL — ABNORMAL LOW (ref 4.22–5.81)
RDW: 17.1 % — ABNORMAL HIGH (ref 11.5–15.5)
WBC: 6.1 10*3/uL (ref 4.0–10.5)

## 2015-07-03 MED ORDER — POTASSIUM CHLORIDE CRYS ER 20 MEQ PO TBCR
20.0000 meq | EXTENDED_RELEASE_TABLET | Freq: Once | ORAL | Status: AC
  Administered 2015-07-03: 20 meq via ORAL
  Filled 2015-07-03: qty 1

## 2015-07-03 MED ORDER — WHITE PETROLATUM GEL
Status: AC
  Administered 2015-07-03: 13:00:00
  Filled 2015-07-03: qty 1

## 2015-07-03 NOTE — Progress Notes (Signed)
Subjective: He denies any abdominal pain Tolerating po  Objective: Vital signs in last 24 hours: Temp:  [97.3 F (36.3 C)-99.2 F (37.3 C)] 97.3 F (36.3 C) (09/22 0418) Pulse Rate:  [67-101] 83 (09/22 0500) Resp:  [17-27] 20 (09/22 0500) BP: (116-168)/(53-79) 158/79 mmHg (09/22 0500) SpO2:  [92 %-100 %] 94 % (09/22 0500)    Intake/Output from previous day: 09/21 0701 - 09/22 0700 In: 855.5 [P.O.:360; I.V.:145.5; IV Piggyback:350] Out: 2800 [Urine:2800] Intake/Output this shift: Total I/O In: 405.5 [I.V.:105.5; IV Piggyback:300] Out: 1450 [Urine:1450]  Abdomen soft, non tender, open wound clean, granulating well, skin looks good  Lab Results:   Recent Labs  07/01/15 1850 07/03/15 0220  WBC 13.3* 6.1  HGB 10.3* 8.9*  HCT 33.8* 29.2*  PLT 114* 88*   BMET  Recent Labs  07/02/15 0240 07/03/15 0220  NA 137 137  K 4.2 3.4*  CL 101 100*  CO2 30 30  GLUCOSE 118* 75  BUN 33* 33*  CREATININE 1.34* 1.13  CALCIUM 8.2* 8.4*   PT/INR No results for input(s): LABPROT, INR in the last 72 hours. ABG No results for input(s): PHART, HCO3 in the last 72 hours.  Invalid input(s): PCO2, PO2  Studies/Results: Ct Abdomen Pelvis Wo Contrast  07/01/2015   CLINICAL DATA:  Abdominal pain, sepsis  EXAM: CT ABDOMEN AND PELVIS WITHOUT CONTRAST  TECHNIQUE: Multidetector CT imaging of the abdomen and pelvis was performed following the standard protocol without IV contrast.  COMPARISON:  08/30/2014  FINDINGS: Lung bases are unremarkable. Atherosclerotic calcifications of coronary arteries.  Sagittal images of the spine shows degenerative changes thoracolumbar spine. The study is limited without IV contrast. Unenhanced liver shows no biliary ductal dilatation. Atherosclerotic calcifications of abdominal aorta and splenic artery. No aortic aneurysm.  Unenhanced pancreas, spleen and adrenal glands are unremarkable. Unenhanced kidneys are symmetrical in size. Bilateral multiple renal  cysts are again noted. No nephrolithiasis. No hydronephrosis or hydroureter. No calcified ureteral calculi are noted. No calcified calculi are noted within urinary bladder. Prostate gland and seminal vesicles are unremarkable.  No small bowel obstruction. Moderate gas and stool noted in transverse colon. Colonic diverticula are noted in descending colon and sigmoid colon. There is no evidence of acute diverticulitis. Moderate gas noted in mid sigmoid colon. Colonic ileus or partial colonic obstruction cannot be excluded.  There is no pericecal inflammation. The terminal ileum is unremarkable.  Again noted postsurgical changes anterior abdominal wall. Again noted status post umbilical hernia repair. Significant scarring in umbilical region. There is high density material within scar and there is high-density material at the level of anterior aspect of the scar/wound see axial image 80. 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 Clinical correlation is necessary. There is no evidence of drainable abscess.  IMPRESSION: 1. Again noted postsurgical changes anterior abdominal wall. Again noted status post umbilical hernia repair. Significant scarring in umbilical region. There is linear high density material within scar and there is high-density material at the level of anterior aspect of the scar/wound see axial image 80. 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 Clinical correlation is necessary. There is no evidence of drainable abscess.  2. Colonic diverticula are noted descending colon and sigmoid colon. No evidence of acute diverticulitis.  3.  No pericecal inflammation.  4. Stable bilateral renal cysts. No hydronephrosis or hydroureter. No small bowel obstruction.  5. Moderate gas noted within mid sigmoid colon. Colonic ileus or partial colonic obstruction cannot be excluded.   Electronically Signed   By:  Lahoma Crocker M.D.   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 Atherosclerotic calcification present in the transverse aorta. Similar appearance of low inspiratory volumes with bibasilar atelectasis and pulmonary vascular congestion. Overall, the appearance of the chest is unchanged. There may be trace bilateral pleural effusions. Diffuse bronchial wall thickening again noted. No acute osseous abnormality.  IMPRESSION: Very similar appearance of the chest compared to 06/12/2015 with pulmonary vascular congestion bordering on mild interstitial edema and trace bilateral effusions.  Inspiratory volumes remain low with bibasilar atelectasis.   Electronically Signed   By: Jacqulynn Cadet M.D.   On: 07/01/2015 17:24   Dg Wrist Complete Right  07/01/2015   CLINICAL DATA:  Right wrist pain for 1 day  EXAM: RIGHT WRIST - COMPLETE 3+ VIEW  COMPARISON:  None.  FINDINGS: Four views of the right wrist submitted. No acute fracture or subluxation. Mild degenerative changes first carpometacarpal joint. Atherosclerotic calcifications of radial artery. Probable old avulsion fracture of triquetrum.  IMPRESSION: No acute fracture or subluxation.  Mild degenerative changes   Electronically Signed   By: Lahoma Crocker M.D.   On: 07/01/2015 17:29   Dg Hand Complete Right  07/01/2015   CLINICAL DATA:  Right wrist pain. Hand pain. No known injury. Gout.  EXAM: RIGHT HAND - COMPLETE 3+ VIEW  COMPARISON:  None.  FINDINGS: Soft tissue structures are unremarkable. Diffuse mild degenerative change. Periarticular erosions are noted about the capitate, base of the proximal phalanx of the right second digit, distal aspect of the proximal phalanx of the right second digit, and proximal aspect of the middle phalanx of the right second digit. These findings suggest  possibility of gout or other crystal deposition disease. Other inflammatory arthropathies cannot be completely excluded. No evidence of fracture or dislocation.  IMPRESSION: 1. Periarticular erosions about the capitate, the second MCP and second PIP joints. These changes suggest the possibility of gout. Other crystal induced or inflammatory arthritis cannot be excluded. 2. Diffuse degenerative change. 3. Peripheral vascular disease.   Electronically Signed   By: Marcello Moores  Register   On: 07/01/2015 17:26    Anti-infectives: Anti-infectives    Start     Dose/Rate Route Frequency Ordered Stop   07/02/15 2000  vancomycin (VANCOCIN) 1,250 mg in sodium chloride 0.9 % 250 mL IVPB     1,250 mg 166.7 mL/hr over 90 Minutes Intravenous Every 24 hours 07/01/15 1828     07/02/15 0100  piperacillin-tazobactam (ZOSYN) IVPB 3.375 g     3.375 g 12.5 mL/hr over 240 Minutes Intravenous Every 8 hours 07/01/15 1828     07/01/15 1815  piperacillin-tazobactam (ZOSYN) IVPB 3.375 g     3.375 g 100 mL/hr over 30 Minutes Intravenous  Once 07/01/15 1812 07/01/15 1919   07/01/15 1815  vancomycin (VANCOCIN) IVPB 1000 mg/200 mL premix  Status:  Discontinued     1,000 mg 200 mL/hr over 60 Minutes Intravenous  Once 07/01/15 1812 07/01/15 1814   07/01/15 1815  vancomycin (VANCOCIN) 2,250 mg in sodium chloride 0.9 % 500 mL IVPB     2,250 mg 250 mL/hr over 120 Minutes Intravenous  Once 07/01/15 1814 07/01/15 2117   07/01/15 1745  clindamycin (CLEOCIN) IVPB 900 mg  900 mg 100 mL/hr over 30 Minutes Intravenous  Once 07/01/15 1732 07/01/15 1840      Assessment/Plan:  Again, the patient does not have an enterocutaneous fistula.  The substance pointed out by radiology superficially in the wound on the CT scan (image 80) was the silver nitrate that I had applied to the wound in my office on 9/20.  There was also no intra-abdominal abscess.  From a general surgical standpoint, there are no other suggestions other than local  wound care.  He should already have an appointment for wound follow-up in my office in about 3 weeks.  Will sign off.  Please call if needed.  LOS: 2 days    BLACKMAN,DOUGLAS A 07/03/2015

## 2015-07-03 NOTE — Progress Notes (Signed)
[facility] PROGRESS NOTE  MORT SMELSER MGQ:[id number] DOB: [birth year] DOA: 07/01/2015  PCP: Jene Every, MD  Brief HPI: 69 year old Caucasian male with a past medical history of COPD, type 2 diabetes, hyperlipidemia, hypertension, diastolic CHF, atrial fibrillation, presented with complains of arm and leg pain. He was found to have cellulitis of his lower extremities. He recently underwent exploratory laparotomy and small bowel resection in August. He is known to have an enterocutaneous fistula and a chronic wound. Patient was found to be septic. He also had urinary retention. He was admitted for further management.  Past medical history:  Past Medical History  Diagnosis Date  . CHRONIC OBSTRUCTIVE PULMONARY DISEASE 06/20/2009  . CAROTID STENOSIS 06/20/2009    A. 08/2001 s/p L CEA;  B.   09/14/11 - Carotid U/S - 40-59% bilateral stenosis, left CEA patch angioplasty is patent  . DM 06/20/2009  . CAD 06/20/2009    A.  08/2000 - s/p CABG x 4 - LIMA-LAD, Left Radial-OM, VG-DIAG, VG-RCA;  B. Neg. MV  2010  . HYPERLIPIDEMIA 06/20/2009  . HYPERTENSION 06/20/2009  . Hypothyroidism   . Low back pain   . Asthma     as child  . Pneumonia   . Persistent atrial fibrillation     Not felt to be coumadin candidate 2/2 ETOH use.  Marland Kitchen History of tobacco abuse     remote - quit 1970  . Morbidly obese   . Chronic diastolic heart failure, NYHA class 3     Followed by [facility] --> Echo 04/2014: EF 26-71%, Diastolic DysFxn - elevated LVEDP & LAP. Mod Dil LA & RA.  . Falls frequently   . Hx of cardiovascular stress test 05/2014    Lexiscan Myoview (8/15):  No ischemia; EF 63% - Normal Study  . Breast cancer, right breast   . Gout   . OBSTRUCTIVE SLEEP APNEA 06/20/2009    does not use cpap  . Stroke     found on MRI in Oct. 2015.   Marland Kitchen Shortness of breath dyspnea     occasional  . Depression     takes Prozac  . GERD (gastroesophageal reflux disease)     takes Prilosec  . Arthritis   .  Neuromuscular disorder     neuropathy in both legs  . Constipation   . ETOH abuse     no alcohol since Oct. 2015  . 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 CHF (congestive heart failure)   . Supplemental oxygen dependent     hs  . Bilateral renal cysts   . Kidney disease     Stage 3    Consultants: None  Procedures: None  Antibiotics: Vancomycin and Zosyn 9/20  Subjective: Patient feels better. Not as confused as yesterday. Denies any pain. Denies shortness of breath. No nausea, vomiting.   Objective: Vital Signs  Filed Vitals:   07/03/15 0400 07/03/15 0418 07/03/15 0500 07/03/15 0600  BP: 145/72  158/79 157/79  Pulse: 83  83 85  Temp:  97.3 F (36.3 C)    TempSrc:  Oral    Resp: 20  20 18   SpO2: 96%  94% 97%    Intake/Output Summary (Last 24 hours) at 07/03/15 0734 Last data filed at 07/03/15 0700  Gross per 24 hour  Intake    870 ml  Output   2800 ml  Net  -1930 ml   There were no vitals filed  for this visit.  General appearance: alert, cooperative, appears stated age and no distress Resp: Diminished air entry at the bases without any crackles or wheezing. No rhonchi. Cardio: regular rate and rhythm, S1, S2 normal, no murmur, click, rub or gallop GI: Abdomen is soft. Mild tenderness over the dressing area. No changes compared to yesterday.  Extremities: Much improved erythema and bilateral lower extremity. Note is warm to touch. Wounds are noted. Pulses: 2+ and symmetric Neurologic: No focal deficits. He is mildly distracted.  Lab Results:  Basic Metabolic Panel:  Recent Labs Lab 07/01/15 1239 07/02/15 0240 07/03/15 0220  NA 138 137 137  K 4.6 4.2 3.4*  CL 98* 101 100*  CO2 28 30 30   GLUCOSE 156* 118* 75  BUN 33* 33* 33*  CREATININE 1.26* 1.34* 1.13  CALCIUM 9.5 8.2* 8.4*  MG  --  1.4*  --   PHOS  --  4.2  --    Liver Function Tests:  Recent Labs Lab 07/02/15 0240  AST 12*  ALT 7*    ALKPHOS 49  BILITOT 0.9  PROT 5.7*  ALBUMIN 2.4*   CBC:  Recent Labs Lab 07/01/15 1239 07/01/15 1850 07/03/15 0220  WBC 12.7* 13.3* 6.1  NEUTROABS 10.8* 11.1*  --   HGB 11.9* 10.3* 8.9*  HCT 38.9* 33.8* 29.2*  MCV 103.2* 102.4* 101.7*  PLT 120* 114* 88*   BNP (last 3 results)  Recent Labs  06/12/15 1112 06/13/15 0445 07/01/15 1627  BNP 474.3* 346.5* 331.3*    CBG:  Recent Labs Lab 07/02/15 1131 07/02/15 1617 07/02/15 2130  GLUCAP 70 84 112*    Recent Results (from the past 240 hour(s))  Blood culture (routine x 2)     Status: None (Preliminary result)   Collection Time: 07/01/15  4:27 PM  Result Value Ref Range Status   Specimen Description BLOOD LEFT ARM  Final   Special Requests BOTTLES DRAWN AEROBIC AND ANAEROBIC 5ML  Final   Culture NO GROWTH < 24 HOURS  Final   Report Status PENDING  Incomplete  Urine culture     Status: None (Preliminary result)   Collection Time: 07/01/15  4:55 PM  Result Value Ref Range Status   Specimen Description URINE, RANDOM  Final   Special Requests NONE  Final   Culture CULTURE REINCUBATED FOR BETTER GROWTH  Final   Report Status PENDING  Incomplete  Blood culture (routine x 2)     Status: None (Preliminary result)   Collection Time: 07/01/15  6:44 PM  Result Value Ref Range Status   Specimen Description BLOOD LEFT HAND  Final   Special Requests BOTTLES DRAWN AEROBIC AND ANAEROBIC 3CC  Final   Culture NO GROWTH < 24 HOURS  Final   Report Status PENDING  Incomplete  MRSA PCR Screening     Status: None   Collection Time: 07/01/15 10:12 PM  Result Value Ref Range Status   MRSA by PCR NEGATIVE NEGATIVE Final    Comment:        The GeneXpert MRSA Assay (FDA approved for NASAL specimens only), is one component of a comprehensive MRSA colonization surveillance program. It is not intended to diagnose MRSA infection nor to guide or monitor treatment for MRSA infections.       Studies/Results: Ct Abdomen Pelvis Wo  Contrast  07/01/2015   CLINICAL DATA:  Abdominal pain, sepsis  EXAM: CT ABDOMEN AND PELVIS WITHOUT CONTRAST  TECHNIQUE: Multidetector CT imaging of the abdomen and pelvis was performed following the  standard protocol without IV contrast.  COMPARISON:  08/30/2014  FINDINGS: Lung bases are unremarkable. Atherosclerotic calcifications of coronary arteries.  Sagittal images of the spine shows degenerative changes thoracolumbar spine. The study is limited without IV contrast. Unenhanced liver shows no biliary ductal dilatation. Atherosclerotic calcifications of abdominal aorta and splenic artery. No aortic aneurysm.  Unenhanced pancreas, spleen and adrenal glands are unremarkable. Unenhanced kidneys are symmetrical in size. Bilateral multiple renal cysts are again noted. No nephrolithiasis. No hydronephrosis or hydroureter. No calcified ureteral calculi are noted. No calcified calculi are noted within urinary bladder. Prostate gland and seminal vesicles are unremarkable.  No small bowel obstruction. Moderate gas and stool noted in transverse colon. Colonic diverticula are noted in descending colon and sigmoid colon. There is no evidence of acute diverticulitis. Moderate gas noted in mid sigmoid colon. Colonic ileus or partial colonic obstruction cannot be excluded.  There is no pericecal inflammation. The terminal ileum is unremarkable.  Again noted postsurgical changes anterior abdominal wall. Again noted status post umbilical hernia repair. Significant scarring in umbilical region. There is high density material within scar and there is high-density material at the level of anterior aspect of the scar/wound see axial image 80. 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 Clinical correlation is necessary. There is no evidence of drainable abscess.  IMPRESSION: 1. Again noted postsurgical changes anterior abdominal wall. Again noted status post umbilical hernia repair. Significant scarring in umbilical  region. There is linear high density material within scar and there is high-density material at the level of anterior aspect of the scar/wound see axial image 80. 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 Clinical correlation is necessary. There is no evidence of drainable abscess.  2. Colonic diverticula are noted descending colon and sigmoid colon. No evidence of acute diverticulitis.  3.  No pericecal inflammation.  4. Stable bilateral renal cysts. No hydronephrosis or hydroureter. No small bowel obstruction.  5. Moderate gas noted within mid sigmoid colon. Colonic ileus or partial colonic obstruction cannot be excluded.   Electronically Signed   By: Lahoma Crocker M.D.   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 Atherosclerotic calcification present in the transverse aorta. Similar appearance of low inspiratory volumes with bibasilar atelectasis and pulmonary vascular congestion. Overall, the appearance of the chest is unchanged. There may be trace bilateral pleural effusions. Diffuse bronchial wall thickening again noted. No acute osseous abnormality.  IMPRESSION: Very similar appearance of the chest compared to 06/12/2015 with pulmonary vascular congestion bordering on mild interstitial edema and trace bilateral effusions.  Inspiratory volumes remain low with bibasilar atelectasis.   Electronically Signed   By: Jacqulynn Cadet M.D.   On: 07/01/2015 17:24   Dg Wrist Complete Right  07/01/2015   CLINICAL DATA:  Right wrist pain for 1 day  EXAM: RIGHT WRIST - COMPLETE 3+ VIEW  COMPARISON:  None.  FINDINGS: Four views of the right wrist submitted. No acute fracture or subluxation. Mild degenerative changes first carpometacarpal joint.  Atherosclerotic calcifications of radial artery. Probable old avulsion fracture of triquetrum.  IMPRESSION: No acute fracture or subluxation.  Mild degenerative changes   Electronically Signed   By: Lahoma Crocker M.D.   On: 07/01/2015 17:29   Dg  Hand Complete Right  07/01/2015   CLINICAL DATA:  Right wrist pain. Hand pain. No known injury. Gout.  EXAM: RIGHT HAND - COMPLETE 3+ VIEW  COMPARISON:  None.  FINDINGS: Soft tissue structures are unremarkable. Diffuse mild degenerative change. Periarticular erosions are noted about the capitate, base of the proximal phalanx of the right second digit, distal aspect of the proximal phalanx of the right second digit, and proximal aspect of the middle phalanx of the right second digit. These findings suggest possibility of gout or other crystal deposition disease. Other inflammatory arthropathies cannot be completely excluded. No evidence of fracture or dislocation.  IMPRESSION: 1. Periarticular erosions about the capitate, the second MCP and second PIP joints. These changes suggest the possibility of gout. Other crystal induced or inflammatory arthritis cannot be excluded. 2. Diffuse degenerative change. 3. Peripheral vascular disease.   Electronically Signed   By: Marcello Moores  Register   On: 07/01/2015 17:26    Medications:  Scheduled: . allopurinol  300 mg Oral Daily  . aspirin EC  325 mg Oral Daily  . atorvastatin  20 mg Oral Daily  . budesonide (PULMICORT) nebulizer solution  0.5 mg Nebulization BID  . carvedilol  3.125 mg Oral BID WC  . colchicine  0.6 mg Oral Daily  . FLUoxetine  20 mg Oral Daily  . folic acid  1 mg Oral Daily  . gabapentin  300 mg Oral BID  . glimepiride  1 mg Oral Q breakfast  . insulin aspart  0-9 Units Subcutaneous TID WC  . levothyroxine  187 mcg Oral QAC breakfast  . magnesium oxide  400 mg Oral BID  . pantoprazole  40 mg Oral Daily  . piperacillin-tazobactam (ZOSYN)  IV  3.375 g Intravenous Q8H  . tamoxifen  20 mg Oral QHS  .  vancomycin  1,250 mg Intravenous Q24H  . venlafaxine XR  37.5 mg Oral Daily   Continuous: . sodium chloride Stopped (07/03/15 0700)   GXQ:JJHERDEYCXK **OR** ondansetron (ZOFRAN) IV  Assessment/Plan:  Principal Problem:   Sepsis due to cellulitis Active Problems:   DM (diabetes mellitus) type II uncontrolled, periph vascular disorder   Hyperlipidemia   PAF (paroxysmal atrial fibrillation)   Hypothyroidism   Hypertension   Sepsis    Sepsis secondary to cellulitis involving lower extremities as well as abdominal wall Patient was admitted to step down. He had borderline low blood pressures initially. He was given IV fluids. Blood pressures have stabilized. Continue with broad-spectrum antibiotics. Await culture reports. Wound care nurse has been consulted. There is likely also an element of chronic venous stasis in the lower extremities. Venous Doppler studies are pending. Appreciate general surgery input. No element of abdominal wound infection. His encephalopathy has improved.   Acute gout Involving his right wrist, elbow. He is on allopurinol. He is also on colchicine. Hesitate giving steroids due to active infection. Cannot use NSAIDs due to chronic kidney disease. If gout hasn't improved by tomorrow and if his infection continues to improve, could try low-dose steroids.  Anemia and Thrombocytopenia Platelet Counts noted to be lower than yesterday. We will discontinue his Lovenox. Continue to monitor his counts. No evidence for overt bleeding. This is most likely dilutional drop in hemoglobin. Continue to monitor.  History of type 2 diabetes mellitus, uncontrolled Continue current regimen. Monitor CBGs which are reasonably well controlled.  History of paroxysmal atrial fibrillation Stable. Heart rate is reasonably well controlled. Patient is not on anticoagulation due to history of alcohol use. Coreg was reinitiated.  History of coronary artery disease status post CABG Stable.  Denies any chest pain.  History of chronic kidney disease stage III Monitor urine output closely. Follow renal function.  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 Patient was recently evaluated by general surgery and underwent exploratory laparotomy and small bowel resection. He no longer has this fistula. Changes seen on the CT scan thought to be related to Silver nitrate was applied in the surgical office.   History of hypothyroidism Stable. Continue levothyroxine  History of essential hypertension Blood pressure has improved. Coreg was reinitiated. Continue to monitor for now.   History of right breast cancer He is status post right total mastectomy in February 2016. He is on hormonal treatment, which will be continued.  DVT Prophylaxis: Stop Lovenox due to dropping platelet counts. If venous Doppler studies are negative, we can utilize compression stockings or SCDs. Code Status: Full code  Family Communication: No family at bedside  Disposition Plan: Continue current treatment. PT and OT to evaluate. Okay for transfer to telemetry     LOS: 2 days   [facility] Pager [phone] 07/03/2015, 7:34 AM  If 7PM-7AM, please contact night-coverage at [email or web address], password [facility]

## 2015-07-03 NOTE — Evaluation (Signed)
Occupational Therapy Evaluation Patient Details Name: [name] MRN: [id number] DOB: 11/20/1945 Today's Date: 07/03/2015    History of Present Illness PAtient is a 69 y.o. male admitted with cellulitis and sepsis with hx of DM/ CAD/ CABG, DM2, HTN, gout, hyperlipidemia, etoh abuse, afib. Pt with chronic abdominal wound  from small bowel resection in August. R mastectomy for male breast cancer.   Clinical Impression   Pt admitted with cellulitis. Pt currently with functional limitations due to the deficits listed below (see OT Problem List).  Pt will benefit from skilled OT to increase their safety and independence with ADL and functional mobility for ADL to facilitate discharge to venue listed below.      Follow Up Recommendations  SNF    Equipment Recommendations  Other (comment) (TBD next venue)    Recommendations for Other Services Rehab consult     Precautions / Restrictions Precautions Precautions: Fall      Mobility Bed Mobility Overal bed mobility: Needs Assistance Bed Mobility: Supine to Sit     Supine to sit: Min assist     General bed mobility comments: cues for sequence and hand placement with assist of RUE at elbow secondary to gout, increased time to complete and mod assist to fully scoot pelvis to EOB  Transfers Overall transfer level: Needs assistance   Transfers: Sit to/from Stand Sit to Stand: Min assist         General transfer comment: cues for sequence and hand placement with slight bed elevation and assist for anterior translation and powering up from surface    Balance Overall balance assessment: Needs assistance   Sitting balance-Leahy Scale: Fair       Standing balance-Leahy Scale: Poor                              ADL Overall ADL's : Needs assistance/impaired Eating/Feeding: Set up;Sitting   Grooming: Set up;Sitting   Upper Body Bathing: Minimal assitance;Sitting   Lower Body Bathing: Moderate  assistance;+2 for physical assistance;Sit to/from stand   Upper Body Dressing : Set up;Sitting   Lower Body Dressing: Moderate assistance;+2 for physical assistance;Sit to/from stand   Toilet Transfer: Moderate assistance;+2 for physical assistance;Stand-pivot;BSC;RW Toilet Transfer Details (indicate cue type and reason): Due to urgency completed as SPT. Pt then completed household distance ambulation with min A, +2 for chair follow.  Toileting- Water quality scientist and Hygiene: Sit to/from stand;Moderate assistance;+2 for physical assistance     Tub/Shower Transfer Details (indicate cue type and reason): mod +2 physical A for sit<>stand, min A +2 equipment for chair follow for ambulating to bathroom Functional mobility during ADLs: +2 for safety/equipment;Rolling walker;Maximal assistance                 Pertinent Vitals/Pain Pain Assessment: 0-10 Pain Score: 4  Pain Location: R hand gout Pain Descriptors / Indicators: Sore Pain Intervention(s): Limited activity within patient's tolerance;Monitored during session;Repositioned     Hand Dominance     Extremity/Trunk Assessment Upper Extremity Assessment Upper Extremity Assessment:  (R hand impaired due to pain)   Lower Extremity Assessment Lower Extremity Assessment: Generalized weakness       Communication Communication Communication: No difficulties   Cognition Arousal/Alertness: Awake/alert Behavior During Therapy: WFL for tasks assessed/performed Overall Cognitive Status: No family/caregiver present to determine baseline cognitive functioning Area of Impairment: Safety/judgement;Memory;Problem solving Orientation Level: Disoriented to;Time     Following Commands: Follows one step commands consistently  Problem Solving: Slow processing;Requires verbal cues                Home Living Family/patient expects to be discharged to:: Skilled nursing facility Living Arrangements: Children Available Help at  Discharge: Family;Available 24 hours/day Type of Home: House Home Access: Stairs to enter [employer] of Steps: 4 Entrance Stairs-Rails: Right;Left;Can reach both Home Layout: One level     Bathroom Shower/Tub: Walk-in shower;Door   Bathroom Toilet: Handicapped height     Home Equipment: Environmental consultant - 2 wheels;Bedside commode;Shower seat          Prior Functioning/Environment Level of Independence: Needs assistance  Gait / Transfers Assistance Needed: pt ambulates with RW limited distance ADL's / Homemaking Assistance Needed: dgtr does the homemaking, dgtr assists with bathing and dressing at times        OT Diagnosis: Generalized weakness;Cognitive deficits (decreased standing balance)   OT Problem List: Decreased strength;Decreased activity tolerance;Impaired balance (sitting and/or standing);Decreased coordination;Decreased cognition;Decreased safety awareness;Decreased knowledge of use of DME or AE;Decreased knowledge of precautions;Obesity   OT Treatment/Interventions: Self-care/ADL training;Therapeutic exercise;DME and/or AE instruction;Therapeutic activities;Cognitive remediation/compensation;Patient/family education;Balance training    OT Goals(Current goals can be found in the care plan section) Acute Rehab OT Goals Patient Stated Goal: return home OT Goal Formulation: With patient Time For Goal Achievement: 07/17/15  OT Frequency: Min 3X/week              End of Session Equipment Utilized During Treatment: Gait belt;Rolling walker Nurse Communication: Mobility status  Activity Tolerance: Patient tolerated treatment well;Other (comment) (see comments regarding decreased O2 stats after ambulation) Patient left: in chair;with call bell/phone within reach   Time: 1209-1235 OT Time Calculation (min): 26 min Charges:  OT General Charges $OT Visit: 1 Procedure OT Evaluation $Initial OT Evaluation Tier I: 1 Procedure $OT Re-eval: 1 Procedure G-Codes:     Betsy Pries 07/03/2015, 12:45 PM

## 2015-07-03 NOTE — Evaluation (Signed)
Physical Therapy Evaluation Patient Details Name: [name] MRN: [id number] DOB: [birth year] Today's Date: 07/03/2015   History of Present Illness  PAtient is a 69 y.o. male admitted with cellulitis and sepsis with hx of DM/ CAD/ CABG, DM2, HTN, gout, hyperlipidemia, etoh abuse, afib. Pt with chronic abdominal wound  from small bowel resection in August. R mastectomy for male breast cancer.  Clinical Impression  Pt pleasant but confused regarding time and safety. Pt lives with dgtr whom he states can help at anytime and he is currently limited by RUE pain from gout. Pt with decreased safety, mobility and gait who will benefit from acute therapy to maximize mobility, function and independence to decrease fall risk and burden of care.     Follow Up Recommendations Home health PT;Supervision for mobility/OOB    Equipment Recommendations  None recommended by PT    Recommendations for Other Services       Precautions / Restrictions Precautions Precautions: Fall Restrictions Weight Bearing Restrictions: No      Mobility  Bed Mobility Overal bed mobility: Needs Assistance Bed Mobility: Supine to Sit     Supine to sit: Min assist     General bed mobility comments: cues for sequence and hand placement with assist of RUE at elbow secondary to gout, increased time to complete and mod assist to fully scoot pelvis to EOB  Transfers Overall transfer level: Needs assistance   Transfers: Sit to/from Stand Sit to Stand: Min assist         General transfer comment: cues for sequence and hand placement with slight bed elevation and assist for anterior translation and powering up from surface  Ambulation/Gait Ambulation/Gait assistance: Min assist;+2 safety/equipment Ambulation Distance (Feet): 30 Feet Assistive device: Rolling walker (2 wheeled) Gait Pattern/deviations: Step-through pattern;Decreased stride length;Wide base of support;Trunk flexed   Gait velocity  interpretation: Below normal speed for age/gender General Gait Details: cues for posture, position inRW and safety. Pt running RW over his own foot x 3 and hitting obstacles without awareness  Stairs            Wheelchair Mobility    Modified Rankin (Stroke Patients Only)       Balance Overall balance assessment: Needs assistance   Sitting balance-Leahy Scale: Fair       Standing balance-Leahy Scale: Poor                               Pertinent Vitals/Pain Pain Score: 5  Pain Location: right hand gout  sats 87% on RA at rest with 90% on 3L with activity    Home Living Family/patient expects to be discharged to:: Private residence Living Arrangements: Children Available Help at Discharge: Family;Available 24 hours/day Type of Home: House Home Access: Stairs to enter Entrance Stairs-Rails: Right;Left;Can reach both Entrance Stairs-Number of Steps: 4 Home Layout: One level Home Equipment: Walker - 2 wheels;Bedside commode;Shower seat      Prior Function Level of Independence: Needs assistance   Gait / Transfers Assistance Needed: pt ambulates with RW limited distance  ADL's / Homemaking Assistance Needed: dgtr does the homemaking, dgtr assists with bathing and dressing at times        Hand Dominance        Extremity/Trunk Assessment   Upper Extremity Assessment: Generalized weakness           Lower Extremity Assessment: Generalized weakness         Communication  Communication: No difficulties  Cognition Arousal/Alertness: Awake/alert Behavior During Therapy: WFL for tasks assessed/performed Overall Cognitive Status: No family/caregiver present to determine baseline cognitive functioning Area of Impairment: Safety/judgement;Memory;Problem solving Orientation Level: Disoriented to;Time     Following Commands: Follows one step commands consistently     Problem Solving: Slow processing;Requires verbal cues      General  Comments      Exercises        Assessment/Plan    PT Assessment Patient needs continued PT services  PT Diagnosis Difficulty walking;Generalized weakness   PT Problem List Decreased strength;Decreased activity tolerance;Decreased balance;Decreased mobility;Decreased knowledge of use of DME;Decreased safety awareness;Pain;Decreased cognition  PT Treatment Interventions DME instruction;Gait training;Stair training;Functional mobility training;Therapeutic activities;Therapeutic exercise;Balance training;Patient/family education   PT Goals (Current goals can be found in the Care Plan section) Acute Rehab PT Goals Patient Stated Goal: return home PT Goal Formulation: With patient Time For Goal Achievement: 07/17/15 Potential to Achieve Goals: Fair    Frequency Min 3X/week   Barriers to discharge        Co-evaluation               End of Session Equipment Utilized During Treatment: Gait belt;Oxygen Activity Tolerance: Patient tolerated treatment well Patient left: in chair;with call bell/phone within reach;with chair alarm set Nurse Communication: Mobility status         Time: 0940-7680 PT Time Calculation (min) (ACUTE ONLY): 19 min   Charges:   PT Evaluation $Initial PT Evaluation Tier I: 1 Procedure     PT G CodesMelford Aase 07/03/2015, 11:41 AM Elwyn Reach, [place]

## 2015-07-03 NOTE — Progress Notes (Signed)
Pt was transferred to room 3E12.  Report was given to [place], Therapist, sports.  Pt had no c/o pain or s/s of any acute distress at time of transfer.

## 2015-07-03 NOTE — Progress Notes (Signed)
VASCULAR LAB PRELIMINARY  PRELIMINARY  PRELIMINARY  PRELIMINARY  Bilateral lower extremity venous duplex  completed.    Preliminary report:  Bilateral:  No evidence of DVT, superficial thrombosis, or Baker's Cyst.   Cestone,Helene, RVT 07/03/2015, 11:35 AM

## 2015-07-04 ENCOUNTER — Inpatient Hospital Stay (HOSPITAL_COMMUNITY): Payer: Medicare Other

## 2015-07-04 ENCOUNTER — Ambulatory Visit: Payer: Medicare Other | Admitting: Physician Assistant

## 2015-07-04 ENCOUNTER — Other Ambulatory Visit: Payer: Medicare Other

## 2015-07-04 DIAGNOSIS — R0600 Dyspnea, unspecified: Secondary | ICD-10-CM

## 2015-07-04 DIAGNOSIS — L03119 Cellulitis of unspecified part of limb: Secondary | ICD-10-CM

## 2015-07-04 LAB — CBC
HEMATOCRIT: 34.9 % — AB (ref 39.0–52.0)
HEMOGLOBIN: 10.6 g/dL — AB (ref 13.0–17.0)
MCH: 31.2 pg (ref 26.0–34.0)
MCHC: 30.4 g/dL (ref 30.0–36.0)
MCV: 102.6 fL — AB (ref 78.0–100.0)
Platelets: 102 10*3/uL — ABNORMAL LOW (ref 150–400)
RBC: 3.4 MIL/uL — ABNORMAL LOW (ref 4.22–5.81)
RDW: 16.8 % — ABNORMAL HIGH (ref 11.5–15.5)
WBC: 6.8 10*3/uL (ref 4.0–10.5)

## 2015-07-04 LAB — GLUCOSE, CAPILLARY
GLUCOSE-CAPILLARY: 184 mg/dL — AB (ref 65–99)
Glucose-Capillary: 165 mg/dL — ABNORMAL HIGH (ref 65–99)
Glucose-Capillary: 163 mg/dL — ABNORMAL HIGH (ref 65–99)
Glucose-Capillary: 159 mg/dL — ABNORMAL HIGH (ref 65–99)

## 2015-07-04 LAB — BASIC METABOLIC PANEL
Anion gap: 10 (ref 5–15)
BUN: 21 mg/dL — ABNORMAL HIGH (ref 6–20)
CHLORIDE: 100 mmol/L — AB (ref 101–111)
CO2: 31 mmol/L (ref 22–32)
CREATININE: 0.89 mg/dL (ref 0.61–1.24)
Calcium: 9.1 mg/dL (ref 8.9–10.3)
GFR calc non Af Amer: >60 mL/min (ref >60)
Glucose, Bld: 128 mg/dL — ABNORMAL HIGH (ref 65–99)
Potassium: 3.5 mmol/L (ref 3.5–5.1)
Sodium: 141 mmol/L (ref 135–145)

## 2015-07-04 LAB — VANCOMYCIN, TROUGH: Vancomycin Tr: 14 ug/mL (ref 10.0–20.0)

## 2015-07-04 MED ORDER — FUROSEMIDE 10 MG/ML IJ SOLN
40.0000 mg | Freq: Once | INTRAMUSCULAR | Status: AC
Start: 2015-07-04 — End: 2015-07-04
  Administered 2015-07-04: 40 mg via INTRAVENOUS
  Filled 2015-07-04: qty 4

## 2015-07-04 MED ORDER — FUROSEMIDE 10 MG/ML IJ SOLN
40.0000 mg | Freq: Two times a day (BID) | INTRAMUSCULAR | Status: DC
  Administered 2015-07-04 – 2015-07-06 (×4): 40 mg via INTRAVENOUS
  Filled 2015-07-04 (×6): qty 4

## 2015-07-04 MED ORDER — ACETAMINOPHEN 325 MG PO TABS
650.0000 mg | ORAL_TABLET | Freq: Four times a day (QID) | ORAL | Status: DC | PRN
  Administered 2015-07-04 – 2015-07-06 (×3): 650 mg via ORAL
  Filled 2015-07-04 (×3): qty 2

## 2015-07-04 MED ORDER — HYDRALAZINE HCL 20 MG/ML IJ SOLN
5.0000 mg | Freq: Once | INTRAMUSCULAR | Status: AC
  Administered 2015-07-04: 5 mg via INTRAVENOUS
  Filled 2015-07-04: qty 1

## 2015-07-04 MED ORDER — CARVEDILOL 6.25 MG PO TABS
6.2500 mg | ORAL_TABLET | Freq: Two times a day (BID) | ORAL | Status: DC
  Administered 2015-07-04 – 2015-07-07 (×6): 6.25 mg via ORAL
  Filled 2015-07-04 (×5): qty 1

## 2015-07-04 MED ORDER — TRAMADOL HCL 50 MG PO TABS
50.0000 mg | ORAL_TABLET | Freq: Four times a day (QID) | ORAL | Status: AC | PRN
  Administered 2015-07-04: 50 mg via ORAL
  Filled 2015-07-04: qty 1

## 2015-07-04 NOTE — Progress Notes (Signed)
Radiology called chest xray results at 1107. Dr. [place] Pink paged and made aware of results. Will continue to monitor patient.

## 2015-07-04 NOTE — Progress Notes (Signed)
[facility] PROGRESS NOTE  [name] LKT:[id number] DOB: [birth year] DOA: 07/01/2015  PCP: Jene Every, MD  Brief HPI: 69 year old Caucasian male with a past medical history of COPD, type 2 diabetes, hyperlipidemia, hypertension, diastolic CHF, atrial fibrillation, presented with complains of arm and leg pain. He was found to have cellulitis of his lower extremities. He recently underwent exploratory laparotomy and small bowel resection in August. He is known to have an enterocutaneous fistula and a chronic wound. Patient was found to be septic. He also had urinary retention. He was admitted for further management.  Past medical history:  Past Medical History  Diagnosis Date  . CHRONIC OBSTRUCTIVE PULMONARY DISEASE 06/20/2009  . CAROTID STENOSIS 06/20/2009    A. 08/2001 s/p L CEA;  B.   09/14/11 - Carotid U/S - 40-59% bilateral stenosis, left CEA patch angioplasty is patent  . DM 06/20/2009  . CAD 06/20/2009    A.  08/2000 - s/p CABG x 4 - LIMA-LAD, Left Radial-OM, VG-DIAG, VG-RCA;  B. Neg. MV  2010  . HYPERLIPIDEMIA 06/20/2009  . HYPERTENSION 06/20/2009  . Hypothyroidism   . Low back pain   . Asthma     as child  . Pneumonia   . Persistent atrial fibrillation     Not felt to be coumadin candidate 2/2 ETOH use.  Marland Kitchen History of tobacco abuse     remote - quit 1970  . Morbidly obese   . Chronic diastolic heart failure, NYHA class 3     Followed by [facility] --> Echo 04/2014: EF 34-28%, Diastolic DysFxn - elevated LVEDP & LAP. Mod Dil LA & RA.  . Falls frequently   . Hx of cardiovascular stress test 05/2014    Lexiscan Myoview (8/15):  No ischemia; EF 63% - Normal Study  . Breast cancer, right breast   . Gout   . OBSTRUCTIVE SLEEP APNEA 06/20/2009    does not use cpap  . Stroke     found on MRI in Oct. 2015.   Marland Kitchen Shortness of breath dyspnea     occasional  . Depression     takes Prozac  . GERD (gastroesophageal reflux disease)     takes Prilosec  . Arthritis   .  Neuromuscular disorder     neuropathy in both legs  . Constipation   . ETOH abuse     no alcohol since Oct. 2015  . 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 CHF (congestive heart failure)   . Supplemental oxygen dependent     hs  . Bilateral renal cysts   . Kidney disease     Stage 3    Consultants: None  Procedures:   Lower extremity venous Dopplers No DVT  Antibiotics: Vancomycin and Zosyn 9/20  Subjective: Patient complains of shortness of breath this morning. No chest pains. No nausea, vomiting. Legs feel better.  Objective: Vital Signs  Filed Vitals:   07/03/15 2350 07/04/15 0300 07/04/15 0557 07/04/15 0914  BP: 172/106 167/96 170/99   Pulse: 89 96 100   Temp: 98.1 F (36.7 C) 98.2 F (36.8 C) 97.7 F (36.5 C)   TempSrc: Oral Oral Oral   Resp: 20 22 21    Height:      Weight:   111.721 kg (246 lb 4.8 oz)   SpO2: 95% 98% 97% 97%    Intake/Output Summary (Last 24 hours) at 07/04/15 7681 Last data filed at 07/04/15 0602  Gross  per 24 hour  Intake   1673 ml  Output   4070 ml  Net  -2397 ml   Filed Weights   07/03/15 1116 07/03/15 1604 07/04/15 0557  Weight: 116.574 kg (257 lb) 113.944 kg (251 lb 3.2 oz) 111.721 kg (246 lb 4.8 oz)    General appearance: alert, cooperative, appears stated age and no distress Resp: Fine crackles heard bilaterally at the bases. No wheezing.  Cardio: regular rate and rhythm, S1, S2 normal, no murmur, click, rub or gallop GI: Abdomen is soft. Mild tenderness over the dressing area. No changes compared to previous.  Extremities: Much improved erythema and bilateral lower extremity. Not as warm to touch. Wounds are noted. Pulses: 2+ and symmetric Neurologic: No focal deficits. He is mildly distracted.  Lab Results:  Basic Metabolic Panel:  Recent Labs Lab 07/01/15 1239 07/02/15 0240 07/03/15 0220 07/04/15 0450  NA 138 137 137 141  K 4.6 4.2 3.4* 3.5  CL 98* 101  100* 100*  CO2 28 30 30 31   GLUCOSE 156* 118* 75 128*  BUN 33* 33* 33* 21*  CREATININE 1.26* 1.34* 1.13 0.89  CALCIUM 9.5 8.2* 8.4* 9.1  MG  --  1.4*  --   --   PHOS  --  4.2  --   --    Liver Function Tests:  Recent Labs Lab 07/02/15 0240  AST 12*  ALT 7*  ALKPHOS 49  BILITOT 0.9  PROT 5.7*  ALBUMIN 2.4*   CBC:  Recent Labs Lab 07/01/15 1239 07/01/15 1850 07/03/15 0220 07/04/15 0450  WBC 12.7* 13.3* 6.1 6.8  NEUTROABS 10.8* 11.1*  --   --   HGB 11.9* 10.3* 8.9* 10.6*  HCT 38.9* 33.8* 29.2* 34.9*  MCV 103.2* 102.4* 101.7* 102.6*  PLT 120* 114* 88* 102*   BNP (last 3 results)  Recent Labs  06/12/15 1112 06/13/15 0445 07/01/15 1627  BNP 474.3* 346.5* 331.3*    CBG:  Recent Labs Lab 07/03/15 1115 07/03/15 1618 07/03/15 1636 07/03/15 2045 07/04/15 0708  GLUCAP 134* 67 73 91 165*    Recent Results (from the past 240 hour(s))  Blood culture (routine x 2)     Status: None (Preliminary result)   Collection Time: 07/01/15  4:27 PM  Result Value Ref Range Status   Specimen Description BLOOD LEFT ARM  Final   Special Requests BOTTLES DRAWN AEROBIC AND ANAEROBIC 5ML  Final   Culture NO GROWTH 2 DAYS  Final   Report Status PENDING  Incomplete  Urine culture     Status: None   Collection Time: 07/01/15  4:55 PM  Result Value Ref Range Status   Specimen Description URINE, RANDOM  Final   Special Requests NONE  Final   Culture MULTIPLE SPECIES PRESENT, SUGGEST RECOLLECTION  Final   Report Status 07/03/2015 FINAL  Final  Blood culture (routine x 2)     Status: None (Preliminary result)   Collection Time: 07/01/15  6:44 PM  Result Value Ref Range Status   Specimen Description BLOOD LEFT HAND  Final   Special Requests BOTTLES DRAWN AEROBIC AND ANAEROBIC 3CC  Final   Culture NO GROWTH 2 DAYS  Final   Report Status PENDING  Incomplete  MRSA PCR Screening     Status: None   Collection Time: 07/01/15 10:12 PM  Result Value Ref Range Status   MRSA by PCR  NEGATIVE NEGATIVE Final    Comment:        The GeneXpert MRSA Assay (FDA approved for  NASAL specimens only), is one component of a comprehensive MRSA colonization surveillance program. It is not intended to diagnose MRSA infection nor to guide or monitor treatment for MRSA infections.       Studies/Results: No results found.  Medications:  Scheduled: . allopurinol  300 mg Oral Daily  . aspirin EC  325 mg Oral Daily  . atorvastatin  20 mg Oral Daily  . budesonide (PULMICORT) nebulizer solution  0.5 mg Nebulization BID  . carvedilol  3.125 mg Oral BID WC  . colchicine  0.6 mg Oral Daily  . FLUoxetine  20 mg Oral Daily  . folic acid  1 mg Oral Daily  . gabapentin  300 mg Oral BID  . glimepiride  1 mg Oral Q breakfast  . insulin aspart  0-9 Units Subcutaneous TID WC  . levothyroxine  187 mcg Oral QAC breakfast  . magnesium oxide  400 mg Oral BID  . pantoprazole  40 mg Oral Daily  . piperacillin-tazobactam (ZOSYN)  IV  3.375 g Intravenous Q8H  . tamoxifen  20 mg Oral QHS  . vancomycin  1,250 mg Intravenous Q24H  . venlafaxine XR  37.5 mg Oral Daily   Continuous: . sodium chloride Stopped (07/03/15 1600)   DGU:[id number], ondansetron **OR** ondansetron (ZOFRAN) IV, traMADol  Assessment/Plan:  Principal Problem:   Sepsis due to cellulitis Active Problems:   DM (diabetes mellitus) type II uncontrolled, periph vascular disorder   Hyperlipidemia   PAF (paroxysmal atrial fibrillation)   Hypothyroidism   Hypertension   Sepsis    Dyspnea Examination is suggestive of pulmonary edema. Chest x-ray has been ordered. We will give him Lasix. Fluid overload, most likely due to aggressive hydration given for sepsis. Recent echocardiogram does not show any systolic or diastolic dysfunction.  Sepsis secondary to cellulitis involving lower extremities as well as abdominal wall Patient was initially admitted to step down. He had borderline low blood pressures initially. He was  given IV fluids. Blood pressures have stabilized. Continue with broad-spectrum antibiotics. Cultures are negative so far. Wound care nurse has been consulted. There is likely also an element of chronic venous stasis in the lower extremities. Venous Doppler studies did not show any DVT. Appreciate general surgery input. No element of abdominal wound infection. His encephalopathy has improved.   Acute gout Involving his right wrist, elbow. He is on allopurinol. He is also on colchicine. Patient feels better. Continue current management. No need for steroids. Cannot use NSAIDs due to chronic kidney disease.   Anemia and Thrombocytopenia Platelet Counts noted to be lower yesterday. Improved this morning. Continue to hold his Lovenox. No evidence for overt bleeding. This is most likely dilutional drop in hemoglobin. Hemoglobin stable.   History of type 2 diabetes mellitus, uncontrolled Continue current regimen. Occasional low values noted. Discontinue his oral agent for now.   History of paroxysmal atrial fibrillation Stable. Heart rate is reasonably well controlled. Patient is not on anticoagulation due to history of alcohol use. Coreg was reinitiated.  History of coronary artery disease status post CABG Stable. Denies any chest pain.  History of chronic kidney disease stage III Monitor urine output closely. Follow renal function.  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 Patient was recently evaluated by general surgery and underwent exploratory laparotomy and small bowel resection. He no longer has this fistula. Changes seen on the CT scan thought to be related to Silver nitrate  was applied in the surgical office.   History of hypothyroidism Stable. Continue levothyroxine  History of essential hypertension Blood pressure has improved. Coreg was  reinitiated. Continue to monitor for now.   History of right breast cancer He is status post right total mastectomy in February 2016. He is on hormonal treatment, which will be continued.  DVT Prophylaxis: Stopped Lovenox due to dropping platelet counts. If venous Doppler studies are negative, we can utilize compression stockings or SCDs. Code Status: Full code  Family Communication: No family at bedside  Disposition Plan: Continue current treatment. PT and OT.      LOS: 3 days   [facility] Pager [phone] 07/04/2015, 9:37 AM  If 7PM-7AM, please contact night-coverage at [email or web address], password [facility]

## 2015-07-04 NOTE — Care Management Important Message (Signed)
Important Message  Patient Details  Name: [name] MRN: [id number] Date of Birth: Nov 15, 1945   Medicare Important Message Given:  Yes-second notification given    Loann Quill 07/04/2015, 10:44 AM

## 2015-07-04 NOTE — Progress Notes (Signed)
ANTIBIOTIC CONSULT NOTE - FOLLOW UP  Pharmacy Consult:  Vancomycin / Zosyn Indication:  LE cellulitis with chronic abdominal wound  Allergies  Allergen Reactions  . Erythromycin Hives    Oramycin    Patient Measurements: Height: 5\' 8"  (172.7 cm) Weight: 246 lb 4.8 oz (111.721 kg) (scale c) IBW/kg (Calculated) : 68.4  Vital Signs: Temp: 98.2 F (36.8 C) (09/23 1957) Temp Source: Oral (09/23 1957) BP: 167/89 mmHg (09/23 1957) Pulse Rate: 88 (09/23 1957) Intake/Output from previous day: 09/22 0701 - 09/23 0700 In: 1963 [P.O.:1518; I.V.:70; IV Piggyback:375] Out: 0865 [Urine:4190]  Labs:  Recent Labs  07/02/15 0240 07/03/15 0220 07/04/15 0450  WBC  --  6.1 6.8  HGB  --  8.9* 10.6*  PLT  --  88* 102*  CREATININE 1.34* 1.13 0.89   Estimated Creatinine Clearance: 92.3 mL/min (by C-G formula based on Cr of 0.89).  Recent Labs  07/04/15 [place] 14     Microbiology: Recent Results (from the past 720 hour(s))  Culture, Urine     Status: None   Collection Time: 06/11/15 12:54 PM  Result Value Ref Range Status   Specimen Description URINE, CLEAN CATCH  Final   Special Requests NONE  Final   Culture MULTIPLE SPECIES PRESENT, SUGGEST RECOLLECTION  Final   Report Status 06/12/2015 FINAL  Final  Blood culture (routine x 2)     Status: None (Preliminary result)   Collection Time: 07/01/15  4:27 PM  Result Value Ref Range Status   Specimen Description BLOOD LEFT ARM  Final   Special Requests BOTTLES DRAWN AEROBIC AND ANAEROBIC 5ML  Final   Culture NO GROWTH 3 DAYS  Final   Report Status PENDING  Incomplete  Urine culture     Status: None   Collection Time: 07/01/15  4:55 PM  Result Value Ref Range Status   Specimen Description URINE, RANDOM  Final   Special Requests NONE  Final   Culture MULTIPLE SPECIES PRESENT, SUGGEST RECOLLECTION  Final   Report Status 07/03/2015 FINAL  Final  Blood culture (routine x 2)     Status: None (Preliminary result)   Collection Time: 07/01/15  6:44 PM  Result Value Ref Range Status   Specimen Description BLOOD LEFT HAND  Final   Special Requests BOTTLES DRAWN AEROBIC AND ANAEROBIC 3CC  Final   Culture NO GROWTH 3 DAYS  Final   Report Status PENDING  Incomplete  MRSA PCR Screening     Status: None   Collection Time: 07/01/15 10:12 PM  Result Value Ref Range Status   MRSA by PCR NEGATIVE NEGATIVE Final    Comment:        The GeneXpert MRSA Assay (FDA approved for NASAL specimens only), is one component of a comprehensive MRSA colonization surveillance program. It is not intended to diagnose MRSA infection nor to guide or monitor treatment for MRSA infections.       Assessment: 56 YOM to continue on vancomycin and Zosyn for LE cellulitis and chronic abdominal wound.  Patient's renal function has been improving and his vancomycin through is therapeutic.  Vanc 9/20 >> Zosyn 9/20 >>  9/23 VT = 14 mcg/mL on 1250mg  q24  9/20 BCx x2 - NGTD 9/20 UCx - negative   Goal of Therapy:  Vancomycin trough level 10-15 mcg/ml   Plan:  - Continue vanc 1250mg  IV Q24H - Continue Zosyn 3.375gm IV Q8H, 4 hr infusion - Monitor renal fxn, clinical progress, weekly vanc trough    Thuy D.  Mina Marble, PharmD, BCPS Pager:  [phone] 07/04/2015, 8:30 PM

## 2015-07-05 DIAGNOSIS — J810 Acute pulmonary edema: Secondary | ICD-10-CM

## 2015-07-05 LAB — GLUCOSE, CAPILLARY
GLUCOSE-CAPILLARY: 123 mg/dL — AB (ref 65–99)
Glucose-Capillary: 118 mg/dL — ABNORMAL HIGH (ref 65–99)
Glucose-Capillary: 172 mg/dL — ABNORMAL HIGH (ref 65–99)
Glucose-Capillary: 164 mg/dL — ABNORMAL HIGH (ref 65–99)

## 2015-07-05 LAB — BASIC METABOLIC PANEL
Anion gap: 8 (ref 5–15)
BUN: 16 mg/dL (ref 6–20)
CALCIUM: 9 mg/dL (ref 8.9–10.3)
CO2: 35 mmol/L — ABNORMAL HIGH (ref 22–32)
CREATININE: 0.8 mg/dL (ref 0.61–1.24)
Chloride: 96 mmol/L — ABNORMAL LOW (ref 101–111)
GFR calc Af Amer: >60 mL/min (ref >60)
GLUCOSE: 122 mg/dL — AB (ref 65–99)
Potassium: 2.9 mmol/L — ABNORMAL LOW (ref 3.5–5.1)
Sodium: 139 mmol/L (ref 135–145)

## 2015-07-05 LAB — CBC
HCT: 35.6 % — ABNORMAL LOW (ref 39.0–52.0)
Hemoglobin: 10.8 g/dL — ABNORMAL LOW (ref 13.0–17.0)
MCH: 30.9 pg (ref 26.0–34.0)
MCHC: 30.3 g/dL (ref 30.0–36.0)
MCV: 102 fL — AB (ref 78.0–100.0)
PLATELETS: 110 10*3/uL — AB (ref 150–400)
RBC: 3.49 MIL/uL — ABNORMAL LOW (ref 4.22–5.81)
RDW: 16.7 % — AB (ref 11.5–15.5)
WBC: 7.3 10*3/uL (ref 4.0–10.5)

## 2015-07-05 MED ORDER — POTASSIUM CHLORIDE CRYS ER 20 MEQ PO TBCR
40.0000 meq | EXTENDED_RELEASE_TABLET | ORAL | Status: AC
  Administered 2015-07-05 (×2): 40 meq via ORAL
  Filled 2015-07-05: qty 2

## 2015-07-05 MED ORDER — DOXYCYCLINE HYCLATE 100 MG PO TABS
100.0000 mg | ORAL_TABLET | Freq: Two times a day (BID) | ORAL | Status: DC
  Administered 2015-07-05 – 2015-07-07 (×5): 100 mg via ORAL
  Filled 2015-07-05 (×5): qty 1

## 2015-07-05 MED ORDER — POTASSIUM CHLORIDE CRYS ER 20 MEQ PO TBCR
40.0000 meq | EXTENDED_RELEASE_TABLET | Freq: Once | ORAL | Status: AC
  Administered 2015-07-05: 40 meq via ORAL
  Filled 2015-07-05: qty 2

## 2015-07-05 MED ORDER — LEVOFLOXACIN 500 MG PO TABS
500.0000 mg | ORAL_TABLET | Freq: Every day | ORAL | Status: DC
  Administered 2015-07-05 – 2015-07-07 (×3): 500 mg via ORAL
  Filled 2015-07-05 (×3): qty 1

## 2015-07-05 NOTE — Progress Notes (Signed)
[facility] PROGRESS NOTE  [name] PXT:[id number] DOB: [birth year] DOA: 07/01/2015  PCP: Jene Every, MD  Brief HPI: 69 year old Caucasian male with a past medical history of COPD, type 2 diabetes, hyperlipidemia, hypertension, diastolic CHF, atrial fibrillation, presented with complains of arm and leg pain. He was found to have cellulitis of his lower extremities. He recently underwent exploratory laparotomy and small bowel resection in August. He is known to have an enterocutaneous fistula and a chronic wound. Patient was found to be septic. He also had urinary retention. He was admitted for further management.  Past medical history:  Past Medical History  Diagnosis Date  . CHRONIC OBSTRUCTIVE PULMONARY DISEASE 06/20/2009  . CAROTID STENOSIS 06/20/2009    A. 08/2001 s/p L CEA;  B.   09/14/11 - Carotid U/S - 40-59% bilateral stenosis, left CEA patch angioplasty is patent  . DM 06/20/2009  . CAD 06/20/2009    A.  08/2000 - s/p CABG x 4 - LIMA-LAD, Left Radial-OM, VG-DIAG, VG-RCA;  B. Neg. MV  2010  . HYPERLIPIDEMIA 06/20/2009  . HYPERTENSION 06/20/2009  . Hypothyroidism   . Low back pain   . Asthma     as child  . Pneumonia   . Persistent atrial fibrillation     Not felt to be coumadin candidate 2/2 ETOH use.  Marland Kitchen History of tobacco abuse     remote - quit 1970  . Morbidly obese   . Chronic diastolic heart failure, NYHA class 3     Followed by [facility] --> Echo 04/2014: EF 62-70%, Diastolic DysFxn - elevated LVEDP & LAP. Mod Dil LA & RA.  . Falls frequently   . Hx of cardiovascular stress test 05/2014    Lexiscan Myoview (8/15):  No ischemia; EF 63% - Normal Study  . Breast cancer, right breast   . Gout   . OBSTRUCTIVE SLEEP APNEA 06/20/2009    does not use cpap  . Stroke     found on MRI in Oct. 2015.   Marland Kitchen Shortness of breath dyspnea     occasional  . Depression     takes Prozac  . GERD (gastroesophageal reflux disease)     takes Prilosec  . Arthritis   .  Neuromuscular disorder     neuropathy in both legs  . Constipation   . ETOH abuse     no alcohol since Oct. 2015  . 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 CHF (congestive heart failure)   . Supplemental oxygen dependent     hs  . Bilateral renal cysts   . Kidney disease     Stage 3    Consultants: None  Procedures:   Lower extremity venous Dopplers No DVT  Antibiotics: Vancomycin and Zosyn 9/20-->9/24 Doxycycline and Levaquin 9/24  Subjective: Patient feels better this morning. Breathing has improved significantly. Pain in the legs is improved as well. No chest pain. No nausea, vomiting.   Objective: Vital Signs  Filed Vitals:   07/04/15 1127 07/04/15 1957 07/04/15 2043 07/05/15 0501  BP: 190/93 167/89  168/74  Pulse: 88 88  86  Temp: 98 F (36.7 C) 98.2 F (36.8 C)    TempSrc: Oral Oral  Oral  Resp: 20   22  Height:      Weight:    111.676 kg (246 lb 3.2 oz)  SpO2: 99% 96% 98% 99%    Intake/Output Summary (Last 24 hours) at 07/05/15 0746  Last data filed at 07/05/15 8119  Gross per 24 hour  Intake   1300 ml  Output   3100 ml  Net  -1800 ml   Filed Weights   07/03/15 1604 07/04/15 0557 07/05/15 0501  Weight: 113.944 kg (251 lb 3.2 oz) 111.721 kg (246 lb 4.8 oz) 111.676 kg (246 lb 3.2 oz)    General appearance: alert, cooperative, appears stated age and no distress Resp: Improved air entry bilaterally. Fewer crackles heard today compared to yesterday. No wheezing.  Cardio: regular rate and rhythm, S1, S2 normal, no murmur, click, rub or gallop GI: Abdomen is soft. Nontender. Covered with dressing.  Extremities: Much improved erythema and bilateral lower extremity. Not as warm to touch. Wounds are noted. Neurologic: No focal deficits. He is mildly distracted.  Lab Results:  Basic Metabolic Panel:  Recent Labs Lab 07/01/15 1239 07/02/15 0240 07/03/15 0220 07/04/15 0450 07/05/15 0414  NA 138 137  137 141 139  K 4.6 4.2 3.4* 3.5 2.9*  CL 98* 101 100* 100* 96*  CO2 28 30 30 31  35*  GLUCOSE 156* 118* 75 128* 122*  BUN 33* 33* 33* 21* 16  CREATININE 1.26* 1.34* 1.13 0.89 0.80  CALCIUM 9.5 8.2* 8.4* 9.1 9.0  MG  --  1.4*  --   --   --   PHOS  --  4.2  --   --   --    Liver Function Tests:  Recent Labs Lab 07/02/15 0240  AST 12*  ALT 7*  ALKPHOS 49  BILITOT 0.9  PROT 5.7*  ALBUMIN 2.4*   CBC:  Recent Labs Lab 07/01/15 1239 07/01/15 1850 07/03/15 0220 07/04/15 0450 07/05/15 0414  WBC 12.7* 13.3* 6.1 6.8 7.3  NEUTROABS 10.8* 11.1*  --   --   --   HGB 11.9* 10.3* 8.9* 10.6* 10.8*  HCT 38.9* 33.8* 29.2* 34.9* 35.6*  MCV 103.2* 102.4* 101.7* 102.6* 102.0*  PLT 120* 114* 88* 102* 110*   BNP (last 3 results)  Recent Labs  06/12/15 1112 06/13/15 0445 07/01/15 1627  BNP 474.3* 346.5* 331.3*    CBG:  Recent Labs Lab 07/04/15 0708 07/04/15 1131 07/04/15 1646 07/04/15 1952 07/05/15 0701  GLUCAP 165* 163* 184* 159* 118*    Recent Results (from the past 240 hour(s))  Blood culture (routine x 2)     Status: None (Preliminary result)   Collection Time: 07/01/15  4:27 PM  Result Value Ref Range Status   Specimen Description BLOOD LEFT ARM  Final   Special Requests BOTTLES DRAWN AEROBIC AND ANAEROBIC 5ML  Final   Culture NO GROWTH 3 DAYS  Final   Report Status PENDING  Incomplete  Urine culture     Status: None   Collection Time: 07/01/15  4:55 PM  Result Value Ref Range Status   Specimen Description URINE, RANDOM  Final   Special Requests NONE  Final   Culture MULTIPLE SPECIES PRESENT, SUGGEST RECOLLECTION  Final   Report Status 07/03/2015 FINAL  Final  Blood culture (routine x 2)     Status: None (Preliminary result)   Collection Time: 07/01/15  6:44 PM  Result Value Ref Range Status   Specimen Description BLOOD LEFT HAND  Final   Special Requests BOTTLES DRAWN AEROBIC AND ANAEROBIC 3CC  Final   Culture NO GROWTH 3 DAYS  Final   Report Status  PENDING  Incomplete  MRSA PCR Screening     Status: None   Collection Time: 07/01/15 10:12 PM  Result Value  Ref Range Status   MRSA by PCR NEGATIVE NEGATIVE Final    Comment:        The GeneXpert MRSA Assay (FDA approved for NASAL specimens only), is one component of a comprehensive MRSA colonization surveillance program. It is not intended to diagnose MRSA infection nor to guide or monitor treatment for MRSA infections.       Studies/Results: Dg Chest 2 View  07/04/2015   CLINICAL DATA:  Difficulty breathing.  EXAM: CHEST  2 VIEW  COMPARISON:  07/01/2015  FINDINGS: There stable changes of median sternotomy and CABG.  Cardiomediastinal silhouette is enlarged. Mediastinal contours appear intact. Atherosclerotic disease of the aorta is noted.  There is no evidence of pneumothorax. There are low lung volumes, with increased interstitial markings suggestive of pulmonary edema, and probably bilateral small pleural effusions. There is an opacity within the right suprahilar region seen on the frontal view with uncertain etiology.  Osseous structures are without acute abnormality. Soft tissues are grossly normal.  IMPRESSION: Worsening aeration of the lungs with pulmonary edema and probable bilateral pleural effusions.  Airspace opacity seen on the frontal view in the right suprahilar region. This may represent a confluence of enlarged vascular markings, however pulmonary mass cannot be excluded. Follow-up with chest CT may be considered.  These results will be called to the ordering clinician or representative by the Radiologist Assistant, and communication documented in the PACS or zVision Dashboard.   Electronically Signed   By: Fidela Salisbury M.D.   On: 07/04/2015 10:51    Medications:  Scheduled: . allopurinol  300 mg Oral Daily  . aspirin EC  325 mg Oral Daily  . atorvastatin  20 mg Oral Daily  . budesonide (PULMICORT) nebulizer solution  0.5 mg Nebulization BID  . carvedilol  6.25  mg Oral BID WC  . colchicine  0.6 mg Oral Daily  . doxycycline  100 mg Oral Q12H  . FLUoxetine  20 mg Oral Daily  . folic acid  1 mg Oral Daily  . furosemide  40 mg Intravenous Q12H  . gabapentin  300 mg Oral BID  . insulin aspart  0-9 Units Subcutaneous TID WC  . levofloxacin  500 mg Oral Daily  . levothyroxine  187 mcg Oral QAC breakfast  . magnesium oxide  400 mg Oral BID  . pantoprazole  40 mg Oral Daily  . potassium chloride  40 mEq Oral Q4H  . potassium chloride  40 mEq Oral Once  . tamoxifen  20 mg Oral QHS  . venlafaxine XR  37.5 mg Oral Daily   Continuous:   VWP:[id number], ondansetron **OR** ondansetron (ZOFRAN) IV  Assessment/Plan:  Principal Problem:   Sepsis due to cellulitis Active Problems:   DM (diabetes mellitus) type II uncontrolled, periph vascular disorder   Hyperlipidemia   PAF (paroxysmal atrial fibrillation)   Hypothyroidism   Hypertension   Sepsis    Acute pulmonary edema secondary to fluid overload/hypokalemia Improved with intravenous Lasix. He has diuresed well. Continue IV Lasix for 1 more day. Fluid overload, most likely due to aggressive hydration given for sepsis. Recent echocardiogram does not show any systolic or diastolic dysfunction. Replace potassium.  Sepsis secondary to cellulitis involving lower extremities as well as abdominal wall Patient was initially admitted to step down. He had borderline low blood pressures initially. He was given IV fluids. Blood pressures have stabilized. Change to oral antibiotics. Cultures are negative so far. Wound care nurse has seen the patient. There is likely also an element of  chronic venous stasis in the lower extremities. Venous Doppler studies did not show any DVT. Appreciate general surgery input. No element of abdominal wound infection. His encephalopathy has improved.   Acute gout Involving his right wrist, elbow. He is on allopurinol. He is also on colchicine. Patient feels better. Continue  current management. No need for steroids. Cannot use NSAIDs due to chronic kidney disease.   Anemia and Thrombocytopenia Placed counts are improved. Anemia, stable. Continue to hold his Lovenox. No evidence for overt bleeding. This is most likely dilutional drop in hemoglobin.   History of type 2 diabetes mellitus, uncontrolled Blood glucose levels have stabilized. Currently only on sliding scale coverage. Oral agent was held due to low blood glucose levels at times.   History of paroxysmal atrial fibrillation Stable. Heart rate is reasonably well controlled. Patient is not on anticoagulation due to history of alcohol use. Coreg was reinitiated.  History of coronary artery disease status post CABG Stable. Denies any chest pain.  History of chronic kidney disease stage III Monitor urine output closely. Renal function is stable.  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 Patient was recently evaluated by general surgery and underwent exploratory laparotomy and small bowel resection. He no longer has this fistula. Changes seen on the CT scan thought to be related to Silver nitrate was applied in the surgical office.   History of hypothyroidism Stable. Continue levothyroxine  History of essential hypertension Blood pressure has improved. Coreg was reinitiated. Continue to monitor for now.   History of right breast cancer He is status post right total mastectomy in February 2016. He is on hormonal treatment, which will be continued.  DVT Prophylaxis: Stopped Lovenox due to dropping platelet counts. Continue compression stockings Code Status: Full code  Family Communication: Discussed with his daughter. She would like for him to return home when he is better. Disposition Plan: Continue current treatment. PT and OT is following. Patient would like to go  home with home health.     LOS: 4 days   [facility] Pager [phone] 07/05/2015, 7:46 AM  If 7PM-7AM, please contact night-coverage at [email or web address], password [facility]

## 2015-07-06 LAB — BASIC METABOLIC PANEL
ANION GAP: 8 (ref 5–15)
BUN: 22 mg/dL — ABNORMAL HIGH (ref 6–20)
CHLORIDE: 98 mmol/L — AB (ref 101–111)
CO2: 33 mmol/L — AB (ref 22–32)
CREATININE: 0.95 mg/dL (ref 0.61–1.24)
Calcium: 9.2 mg/dL (ref 8.9–10.3)
GFR calc non Af Amer: >60 mL/min (ref >60)
Glucose, Bld: 129 mg/dL — ABNORMAL HIGH (ref 65–99)
POTASSIUM: 3.6 mmol/L (ref 3.5–5.1)
SODIUM: 139 mmol/L (ref 135–145)

## 2015-07-06 LAB — CULTURE, BLOOD (ROUTINE X 2)
Culture: NO GROWTH
Culture: NO GROWTH

## 2015-07-06 LAB — GLUCOSE, CAPILLARY
GLUCOSE-CAPILLARY: 104 mg/dL — AB (ref 65–99)
GLUCOSE-CAPILLARY: 143 mg/dL — AB (ref 65–99)
Glucose-Capillary: 167 mg/dL — ABNORMAL HIGH (ref 65–99)
Glucose-Capillary: 145 mg/dL — ABNORMAL HIGH (ref 65–99)

## 2015-07-06 MED ORDER — OXYCODONE HCL 5 MG PO TABS
10.0000 mg | ORAL_TABLET | Freq: Four times a day (QID) | ORAL | Status: DC | PRN
  Administered 2015-07-06: 10 mg via ORAL
  Filled 2015-07-06 (×2): qty 2

## 2015-07-06 MED ORDER — POTASSIUM CHLORIDE CRYS ER 20 MEQ PO TBCR
40.0000 meq | EXTENDED_RELEASE_TABLET | Freq: Once | ORAL | Status: AC
  Administered 2015-07-06: 40 meq via ORAL
  Filled 2015-07-06: qty 2

## 2015-07-06 MED ORDER — MUPIROCIN 2 % EX OINT
TOPICAL_OINTMENT | CUTANEOUS | Status: AC
  Filled 2015-07-06: qty 22

## 2015-07-06 MED ORDER — BACITRACIN-NEOMYCIN-POLYMYXIN OINTMENT TUBE
TOPICAL_OINTMENT | Freq: Two times a day (BID) | CUTANEOUS | Status: DC
  Administered 2015-07-06 – 2015-07-07 (×3): via TOPICAL
  Filled 2015-07-06 (×2): qty 14

## 2015-07-06 MED ORDER — FUROSEMIDE 20 MG PO TABS
60.0000 mg | ORAL_TABLET | Freq: Two times a day (BID) | ORAL | Status: DC
  Administered 2015-07-06 – 2015-07-07 (×2): 60 mg via ORAL
  Filled 2015-07-06 (×6): qty 1

## 2015-07-06 NOTE — Progress Notes (Signed)
Approximately 2245 last night (07/05/15), with alarm on, pt was attempting to get out of bed and injured the 3rd toe on his left foot. Toe nail lifted up and bleed a little. The area was cleaned & left opened to air. Again, patient did the same early this morning where he pulled the nail almost out. Cleaned again, day RN informed to address this with MD during rounds.

## 2015-07-06 NOTE — Progress Notes (Signed)
[facility] PROGRESS NOTE  KHAIR CHASTEEN LZJ:[id number] DOB: 1946-09-17 DOA: 07/01/2015  PCP: Jene Every, MD  Brief HPI: 69 year old Caucasian male with a past medical history of COPD, type 2 diabetes, hyperlipidemia, hypertension, diastolic CHF, atrial fibrillation, presented with complains of arm and leg pain. He was found to have cellulitis of his lower extremities. He recently underwent exploratory laparotomy and small bowel resection in August. He is known to have an enterocutaneous fistula and a chronic wound. Patient was found to be septic. He also had urinary retention. He was admitted for further management.  Past medical history:  Past Medical History  Diagnosis Date  . CHRONIC OBSTRUCTIVE PULMONARY DISEASE 06/20/2009  . CAROTID STENOSIS 06/20/2009    A. 08/2001 s/p L CEA;  B.   09/14/11 - Carotid U/S - 40-59% bilateral stenosis, left CEA patch angioplasty is patent  . DM 06/20/2009  . CAD 06/20/2009    A.  08/2000 - s/p CABG x 4 - LIMA-LAD, Left Radial-OM, VG-DIAG, VG-RCA;  B. Neg. MV  2010  . HYPERLIPIDEMIA 06/20/2009  . HYPERTENSION 06/20/2009  . Hypothyroidism   . Low back pain   . Asthma     as child  . Pneumonia   . Persistent atrial fibrillation     Not felt to be coumadin candidate 2/2 ETOH use.  Marland Kitchen History of tobacco abuse     remote - quit 1970  . Morbidly obese   . Chronic diastolic heart failure, NYHA class 3     Followed by [facility] Clinic --> Echo 04/2014: EF 02-40%, Diastolic DysFxn - elevated LVEDP & LAP. Mod Dil LA & RA.  . Falls frequently   . Hx of cardiovascular stress test 05/2014    Lexiscan Myoview (8/15):  No ischemia; EF 63% - Normal Study  . Breast cancer, right breast   . Gout   . OBSTRUCTIVE SLEEP APNEA 06/20/2009    does not use cpap  . Stroke     found on MRI in Oct. 2015.   Marland Kitchen Shortness of breath dyspnea     occasional  . Depression     takes Prozac  . GERD (gastroesophageal reflux disease)     takes Prilosec  . Arthritis   .  Neuromuscular disorder     neuropathy in both legs  . Constipation   . ETOH abuse     no alcohol since Oct. 2015  . 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 CHF (congestive heart failure)   . Supplemental oxygen dependent     hs  . Bilateral renal cysts   . Kidney disease     Stage 3    Consultants: None  Procedures:   Lower extremity venous Dopplers No DVT  Antibiotics: Vancomycin and Zosyn 9/20-->9/24 Doxycycline and Levaquin 9/24  Subjective: Patient accidentally avulsed part of his left third toe nail. There was some bleeding which has subsided. Patient otherwise feels quite well. Jennet Maduro on going home.  Objective: Vital Signs  Filed Vitals:   07/05/15 2028 07/05/15 2100 07/06/15 0424 07/06/15 0830  BP:  173/94 148/83   Pulse:  96 96   Temp:  97.6 F (36.4 C) 97.9 F (36.6 C)   TempSrc:  Oral Oral   Resp:  19 18   Height:      Weight:   111.449 kg (245 lb 11.2 oz)   SpO2: 97%  100% 96%    Intake/Output Summary (Last 24 hours) at  07/06/15 0920 Last data filed at 07/06/15 0841  Gross per 24 hour  Intake   1100 ml  Output   1402 ml  Net   -302 ml   Filed Weights   07/04/15 0557 07/05/15 0501 07/06/15 0424  Weight: 111.721 kg (246 lb 4.8 oz) 111.676 kg (246 lb 3.2 oz) 111.449 kg (245 lb 11.2 oz)    General appearance: alert, cooperative, appears stated age and no distress Resp: Air entry is much improved. No crackles heard today. No wheezing.  Cardio: regular rate and rhythm, S1, S2 normal, no murmur, click, rub or gallop GI: Abdomen is soft. Nontender. Covered with dressing.  Extremities: Much improved erythema and bilateral lower extremity. Not as warm to touch. Wounds are noted. Partially avulsed nail on the left third toe. Neurologic: No focal deficits. He is mildly distracted.  Lab Results:  Basic Metabolic Panel:  Recent Labs Lab 07/02/15 0240 07/03/15 0220 07/04/15 0450 07/05/15 0414  07/06/15 0332  NA 137 137 141 139 139  K 4.2 3.4* 3.5 2.9* 3.6  CL 101 100* 100* 96* 98*  CO2 30 30 31  35* 33*  GLUCOSE 118* 75 128* 122* 129*  BUN 33* 33* 21* 16 22*  CREATININE 1.34* 1.13 0.89 0.80 0.95  CALCIUM 8.2* 8.4* 9.1 9.0 9.2  MG 1.4*  --   --   --   --   PHOS 4.2  --   --   --   --    Liver Function Tests:  Recent Labs Lab 07/02/15 0240  AST 12*  ALT 7*  ALKPHOS 49  BILITOT 0.9  PROT 5.7*  ALBUMIN 2.4*   CBC:  Recent Labs Lab 07/01/15 1239 07/01/15 1850 07/03/15 0220 07/04/15 0450 07/05/15 0414  WBC 12.7* 13.3* 6.1 6.8 7.3  NEUTROABS 10.8* 11.1*  --   --   --   HGB 11.9* 10.3* 8.9* 10.6* 10.8*  HCT 38.9* 33.8* 29.2* 34.9* 35.6*  MCV 103.2* 102.4* 101.7* 102.6* 102.0*  PLT 120* 114* 88* 102* 110*   BNP (last 3 results)  Recent Labs  06/12/15 1112 06/13/15 0445 07/01/15 1627  BNP 474.3* 346.5* 331.3*    CBG:  Recent Labs Lab 07/05/15 0701 07/05/15 1117 07/05/15 1610 07/05/15 2223 07/06/15 0721  GLUCAP 118* 172* 123* 164* 104*    Recent Results (from the past 240 hour(s))  Blood culture (routine x 2)     Status: None (Preliminary result)   Collection Time: 07/01/15  4:27 PM  Result Value Ref Range Status   Specimen Description BLOOD LEFT ARM  Final   Special Requests BOTTLES DRAWN AEROBIC AND ANAEROBIC 5ML  Final   Culture NO GROWTH 4 DAYS  Final   Report Status PENDING  Incomplete  Urine culture     Status: None   Collection Time: 07/01/15  4:55 PM  Result Value Ref Range Status   Specimen Description URINE, RANDOM  Final   Special Requests NONE  Final   Culture MULTIPLE SPECIES PRESENT, SUGGEST RECOLLECTION  Final   Report Status 07/03/2015 FINAL  Final  Blood culture (routine x 2)     Status: None (Preliminary result)   Collection Time: 07/01/15  6:44 PM  Result Value Ref Range Status   Specimen Description BLOOD LEFT HAND  Final   Special Requests BOTTLES DRAWN AEROBIC AND ANAEROBIC 3CC  Final   Culture NO GROWTH 4 DAYS   Final   Report Status PENDING  Incomplete  MRSA PCR Screening     Status: None  Collection Time: 07/01/15 10:12 PM  Result Value Ref Range Status   MRSA by PCR NEGATIVE NEGATIVE Final    Comment:        The GeneXpert MRSA Assay (FDA approved for NASAL specimens only), is one component of a comprehensive MRSA colonization surveillance program. It is not intended to diagnose MRSA infection nor to guide or monitor treatment for MRSA infections.       Studies/Results: Dg Chest 2 View  07/04/2015   CLINICAL DATA:  Difficulty breathing.  EXAM: CHEST  2 VIEW  COMPARISON:  07/01/2015  FINDINGS: There stable changes of median sternotomy and CABG.  Cardiomediastinal silhouette is enlarged. Mediastinal contours appear intact. Atherosclerotic disease of the aorta is noted.  There is no evidence of pneumothorax. There are low lung volumes, with increased interstitial markings suggestive of pulmonary edema, and probably bilateral small pleural effusions. There is an opacity within the right suprahilar region seen on the frontal view with uncertain etiology.  Osseous structures are without acute abnormality. Soft tissues are grossly normal.  IMPRESSION: Worsening aeration of the lungs with pulmonary edema and probable bilateral pleural effusions.  Airspace opacity seen on the frontal view in the right suprahilar region. This may represent a confluence of enlarged vascular markings, however pulmonary mass cannot be excluded. Follow-up with chest CT may be considered.  These results will be called to the ordering clinician or representative by the Radiologist Assistant, and communication documented in the PACS or zVision Dashboard.   Electronically Signed   By: Fidela Salisbury M.D.   On: 07/04/2015 10:51    Medications:  Scheduled: . allopurinol  300 mg Oral Daily  . aspirin EC  325 mg Oral Daily  . atorvastatin  20 mg Oral Daily  . budesonide (PULMICORT) nebulizer solution  0.5 mg Nebulization BID   . carvedilol  6.25 mg Oral BID WC  . colchicine  0.6 mg Oral Daily  . doxycycline  100 mg Oral Q12H  . FLUoxetine  20 mg Oral Daily  . folic acid  1 mg Oral Daily  . furosemide  40 mg Intravenous Q12H  . furosemide  60 mg Oral BID  . gabapentin  300 mg Oral BID  . insulin aspart  0-9 Units Subcutaneous TID WC  . levofloxacin  500 mg Oral Daily  . levothyroxine  187 mcg Oral QAC breakfast  . magnesium oxide  400 mg Oral BID  . neomycin-bacitracin-polymyxin   Topical BID  . pantoprazole  40 mg Oral Daily  . potassium chloride  40 mEq Oral Once  . tamoxifen  20 mg Oral QHS  . venlafaxine XR  37.5 mg Oral Daily   Continuous:   [id number], ondansetron **OR** ondansetron (ZOFRAN) IV  Assessment/Plan:  Principal Problem:   Sepsis due to cellulitis Active Problems:   DM (diabetes mellitus) type II uncontrolled, periph vascular disorder   Hyperlipidemia   PAF (paroxysmal atrial fibrillation)   Hypothyroidism   Hypertension   Sepsis    Acute pulmonary edema secondary to fluid overload/hypokalemia Improved with intravenous Lasix. He has diuresed well. Change to oral Lasix. Fluid overload, most likely due to aggressive hydration given for sepsis. Recent echocardiogram does not show any systolic or diastolic dysfunction. Potassium is normal.  Sepsis secondary to cellulitis involving lower extremities as well as abdominal wall Patient was initially admitted to step down. He had borderline low blood pressures initially. He was given IV fluids. Blood pressures have stabilized. Change to oral antibiotics. Cultures are negative so far. Wound care  nurse has seen the patient. There is likely also an element of chronic venous stasis in the lower extremities. Venous Doppler studies did not show any DVT. Appreciate general surgery input. No element of abdominal wound infection. His encephalopathy has improved.   Nail Nail was clipped. Dressing to be applied with antibiotic ointment. He  has been asked to follow-up with his primary care physician or podiatry.  Acute gout Involving his right wrist, elbow. He is on allopurinol. He is also on colchicine. Patient is improved.. Continue current management. No need for steroids. Cannot use NSAIDs due to chronic kidney disease.   Anemia and Thrombocytopenia Platelet counts are improved. Anemia, stable. Continue to hold his Lovenox. No evidence for overt bleeding. This was most likely dilutional drop in hemoglobin.   History of type 2 diabetes mellitus, uncontrolled Blood glucose levels have stabilized. Currently only on sliding scale coverage. Oral agent was held due to low blood glucose levels at times.   History of paroxysmal atrial fibrillation Stable. Heart rate is reasonably well controlled. Patient is not on anticoagulation due to history of alcohol use. Coreg was reinitiated.  History of coronary artery disease status post CABG Stable. Denies any chest pain.  History of chronic kidney disease stage III Monitor urine output closely. Renal function is stable.  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 Patient was recently evaluated by general surgery and underwent exploratory laparotomy and small bowel resection. He no longer has this fistula. Changes seen on the CT scan thought to be related to Silver nitrate was applied in the surgical office.   History of hypothyroidism Stable. Continue levothyroxine  History of essential hypertension Blood pressure has improved. Coreg was reinitiated. Continue to monitor for now.   History of right breast cancer He is status post right total mastectomy in February 2016. He is on hormonal treatment, which will be continued.  DVT Prophylaxis: Stopped Lovenox due to dropping platelet counts. Continue compression stockings Code Status: Full code    Family Communication: Discussed with his daughter. She would like for him to return home when he is better. Disposition Plan: Continue current treatment. PT and OT is following. Patient would like to go home with home health. Anticipate discharge tomorrow     LOS: 5 days   [place] [facility] Pager [phone] 07/06/2015, 9:20 AM  If 7PM-7AM, please contact night-coverage at [email or web address], password [facility]

## 2015-07-06 NOTE — Progress Notes (Signed)
Foley cath D/C  Per MD order ,patient tolerated procedure well.

## 2015-07-07 LAB — BASIC METABOLIC PANEL
ANION GAP: 6 (ref 5–15)
BUN: 22 mg/dL — AB (ref 6–20)
CALCIUM: 9.2 mg/dL (ref 8.9–10.3)
CO2: 34 mmol/L — AB (ref 22–32)
Chloride: 100 mmol/L — ABNORMAL LOW (ref 101–111)
Creatinine, Ser: 0.98 mg/dL (ref 0.61–1.24)
GFR calc Af Amer: >60 mL/min (ref >60)
GLUCOSE: 132 mg/dL — AB (ref 65–99)
Potassium: 3.7 mmol/L (ref 3.5–5.1)
Sodium: 140 mmol/L (ref 135–145)

## 2015-07-07 LAB — GLUCOSE, CAPILLARY
Glucose-Capillary: 116 mg/dL — ABNORMAL HIGH (ref 65–99)
Glucose-Capillary: 170 mg/dL — ABNORMAL HIGH (ref 65–99)

## 2015-07-07 MED ORDER — CARVEDILOL 3.125 MG PO TABS: 6.2500 mg | ORAL_TABLET | Freq: Two times a day (BID) | ORAL | Status: DC

## 2015-07-07 MED ORDER — LEVOFLOXACIN 500 MG PO TABS: 500.0000 mg | ORAL_TABLET | Freq: Every day | ORAL | Status: DC

## 2015-07-07 MED ORDER — BACITRACIN-NEOMYCIN-POLYMYXIN OINTMENT TUBE: 1.0000 "application " | TOPICAL_OINTMENT | Freq: Two times a day (BID) | CUTANEOUS | Status: DC

## 2015-07-07 MED ORDER — FUROSEMIDE 80 MG PO TABS: 80.0000 mg | ORAL_TABLET | Freq: Two times a day (BID) | ORAL | Status: DC

## 2015-07-07 MED ORDER — DOXYCYCLINE HYCLATE 100 MG PO TABS: 100.0000 mg | ORAL_TABLET | Freq: Two times a day (BID) | ORAL | Status: DC

## 2015-07-07 NOTE — Care Management Note (Addendum)
Case Management Note  Patient Details  Name: [name] MRN: [id number] Date of Birth: 1946-05-15  Subjective/Objective:    Sepsis due to cellulitis                Action/Plan:  Home Health.  NCM spoke to pt and states he is active with [place] for [facility]. He lives at home with his dtr, Darrold Junker # [phone]. Gave permission to speak with daughter. Dtr reported pt does have [place] for [facility]. Contacted [facility] to make aware of scheduled dc home today. He has oxygen at home. NCM requested dtr bring his portable tank to use at dc. He has RW and 3n1 at home. Dtr states she is at home with pt and he is not at home alone.   Expected Discharge Date:  07/07/2015                Expected Discharge Plan:  [place]  In-House Referral:     Discharge planning Services  Case Management  Post Acute Care Choice:  Home Health, Resumption of Svcs/PTA Provider Choice offered to:  Adult Children, Patient   HH Arranged:  RN, PT, OT HH Agency:  [facility]  Status of Service:  Completed, signed off  Medicare Important Message Given:  Yes-second notification given Date Medicare IM Given:    Medicare IM give by:    Date Additional Medicare IM Given:    Additional Medicare Important Message give by:     If discussed at [place] of Stay Meetings, dates discussed:    Additional Comments:  Erenest Rasher, RN 07/07/2015, 9:58 AM

## 2015-07-07 NOTE — Care Management Important Message (Signed)
Important Message  Patient Details  Name: [name] MRN: [id number] Date of Birth: 1945/10/16   Medicare Important Message Given:  Yes-third notification given    Delorse Lek 07/07/2015, 2:33 PM

## 2015-07-07 NOTE — Progress Notes (Signed)
Physical Therapy Treatment Patient Details Name: [name] MRN: [id number] DOB: 1945-12-04 Today's Date: 07/07/2015    History of Present Illness PAtient is a 69 y.o. male admitted with cellulitis and sepsis with hx of DM/ CAD/ CABG, DM2, HTN, gout, hyperlipidemia, etoh abuse, afib. Pt with chronic abdominal wound  from small bowel resection in August. R mastectomy for male breast cancer.    PT Comments    Pt continues to improve with mobility and gait but limited by fatigue and lethargy today. Pt with assist for pericare but improved transfers with encouragement to continue gait and HEP throughout the day. Will continue to follow.   Follow Up Recommendations  Home health PT;Supervision for mobility/OOB     Equipment Recommendations       Recommendations for Other Services       Precautions / Restrictions Precautions Precautions: Fall    Mobility  Bed Mobility Overal bed mobility: Modified Independent Bed Mobility: Sit to Supine           General bed mobility comments: cues for sequence with increased time to lift legs to surface but no physical assist required  Transfers Overall transfer level: Needs assistance   Transfers: Sit to/from Stand Sit to Stand: Min assist         General transfer comment: cues for hand placement and anterior translation with assist to rise from surface from chair and BSC  Ambulation/Gait Ambulation/Gait assistance: Min guard Ambulation Distance (Feet): 30 Feet Assistive device: Rolling walker (2 wheeled) Gait Pattern/deviations: Step-through pattern;Decreased stride length;Trunk flexed;Wide base of support   Gait velocity interpretation: Below normal speed for age/gender General Gait Details: cues for posture, position inRW and safety.  Pt walked 5' to [facility] then an additional 30' before bed. pt not running over his feet with DME today   Stairs            Wheelchair Mobility    Modified Rankin (Stroke Patients  Only)       Balance Overall balance assessment: Needs assistance   Sitting balance-Leahy Scale: Fair       Standing balance-Leahy Scale: Poor                      Cognition Arousal/Alertness: Awake/alert Behavior During Therapy: Flat affect Overall Cognitive Status: Within Functional Limits for tasks assessed         Following Commands: Follows one step commands consistently     Problem Solving: Slow processing General Comments: pt very sleepy and slightly more alert with gait but as soon as static eyes closing again    Exercises General Exercises - Lower Extremity Long Arc Quad: AROM;Seated;Both;15 reps Hip ABduction/ADduction: AROM;Seated;Both;15 reps Hip Flexion/Marching: AROM;Seated;Both;15 reps    General Comments        Pertinent Vitals/Pain Pain Assessment: No/denies pain    Home Living                      Prior Function            PT Goals (current goals can now be found in the care plan section) Progress towards PT goals: Progressing toward goals    Frequency       PT Plan Current plan remains appropriate    Co-evaluation             End of Session Equipment Utilized During Treatment: Oxygen Activity Tolerance: Patient tolerated treatment well Patient left: in bed;with call bell/phone within reach;with bed alarm set  Time: 8412-8208 PT Time Calculation (min) (ACUTE ONLY): 21 min  Charges:  $Gait Training: 8-22 mins                    G Codes:      [name] 07/24/15, 9:44 AM Elwyn Reach, [place]

## 2015-07-07 NOTE — Discharge Instructions (Signed)
Please have your left 3rd toe evaluated by your PCP or podiatrist this week.  Cellulitis Cellulitis is an infection of the skin and the tissue beneath it. The infected area is usually red and tender. Cellulitis occurs most often in the arms and lower legs.  CAUSES  Cellulitis is caused by bacteria that enter the skin through cracks or cuts in the skin. The most common types of bacteria that cause cellulitis are staphylococci and streptococci. SIGNS AND SYMPTOMS   Redness and warmth.  Swelling.  Tenderness or pain.  Fever. DIAGNOSIS  Your health care provider can usually determine what is wrong based on a physical exam. Blood tests may also be done. TREATMENT  Treatment usually involves taking an antibiotic medicine. HOME CARE INSTRUCTIONS   Take your antibiotic medicine as directed by your health care provider. Finish the antibiotic even if you start to feel better.  Keep the infected arm or leg elevated to reduce swelling.  Apply a warm cloth to the affected area up to 4 times per day to relieve pain.  Take medicines only as directed by your health care provider.  Keep all follow-up visits as directed by your health care provider. SEEK MEDICAL CARE IF:   You notice red streaks coming from the infected area.  Your red area gets larger or turns dark in color.  Your bone or joint underneath the infected area becomes painful after the skin has healed.  Your infection returns in the same area or another area.  You notice a swollen bump in the infected area.  You develop new symptoms.  You have a fever. SEEK IMMEDIATE MEDICAL CARE IF:   You feel very sleepy.  You develop vomiting or diarrhea.  You have a general ill feeling (malaise) with muscle aches and pains. MAKE SURE YOU:   Understand these instructions.  Will watch your condition.  Will get help right away if you are not doing well or get worse. Document Released: 07/07/2005 Document Revised: 02/11/2014  Document Reviewed: 12/13/2011 [facility] Patient Information 2015 [place], [place]. This information is not intended to replace advice given to you by your health care provider. Make sure you discuss any questions you have with your health care provider.

## 2015-07-07 NOTE — Discharge Summary (Signed)
[facility]  Physician Discharge Summary   Patient ID: [name] MRN: [id number] DOB/AGE: 20-Nov-1945 69 y.o.  Admit date: 07/01/2015 Discharge date: 07/07/2015  PCP: Jene Every, MD  DISCHARGE DIAGNOSES:  Principal Problem:   Sepsis due to cellulitis Active Problems:   DM (diabetes mellitus) type II uncontrolled, periph vascular disorder   Hyperlipidemia   PAF (paroxysmal atrial fibrillation)   Hypothyroidism   Hypertension   Sepsis   RECOMMENDATIONS FOR OUTPATIENT FOLLOW UP: 1. Per Pharmacist: Consider changing Fluoxetine to alternative agent due to interaction with Tamoxifen 2. Also noted to be on both fluoxetine and venlafaxine. Please address as indicated. 3. Diabetic agent has been held due to episodes of hypoglycemia. This is to be addressed at follow-up. 4. Patient accidentally avulsed his nail on the third left toe. This nail was clipped. Will need to be monitored as an outpatient. May need to go to a podiatrist. 5. Home health has been ordered   DISCHARGE CONDITION: fair  Diet recommendation: Modified carbohydrate  Filed Weights   07/05/15 0501 07/06/15 0424 07/07/15 0602  Weight: 111.676 kg (246 lb 3.2 oz) 111.449 kg (245 lb 11.2 oz) 112 kg (246 lb 14.6 oz)    INITIAL HISTORY: 69 year old Caucasian male with a past medical history of COPD, type 2 diabetes, hyperlipidemia, hypertension, diastolic CHF, atrial fibrillation, presented with complains of arm and leg pain. He was found to have cellulitis of his lower extremities. He recently underwent exploratory laparotomy and small bowel resection in August. He is known to have an enterocutaneous fistula and a chronic wound. Patient was found to be septic. He also had urinary retention. He was admitted for further management.  Consultations:  None  Procedures: Lower extremity venous Dopplers No DVT   HOSPITAL COURSE:   Acute pulmonary edema secondary to fluid overload/hypokalemia Improved  with intravenous Lasix. He has diuresed well. He was changed over to oral Lasix. Fluid overload, most likely due to aggressive hydration given for sepsis. Recent echocardiogram does not show any systolic or diastolic dysfunction. Potassium is normal.  Sepsis secondary to cellulitis involving lower extremities as well as abdominal wall Patient was initially admitted to step down. He had borderline low blood pressures initially. He was given IV fluids. Blood pressures have stabilized. Patient was initially placed on broad-spectrum antibiotics. Cultures were negative. He was changed over to oral antibiotics. Wound care nurse has seen the patient. There is likely also an element of chronic venous stasis in the lower extremities. Venous Doppler studies did not show any DVT. Appreciate general surgery input. No element of abdominal wound infection. His encephalopathy has improved though he still remains distracted at times.   Avulsed Nail , left third toe Nail was clipped. Dressing to be applied with antibiotic ointment. He has been asked to follow-up with his primary care physician or podiatry.  Acute gout Involving his right wrist, elbow. He is on allopurinol. He is also on colchicine. Patient is improved. Cannot use NSAIDs due to chronic kidney disease.   Anemia and Thrombocytopenia Platelet counts are improved. Anemia, stable. Continue to hold his Lovenox. No evidence for overt bleeding. This was most likely dilutional drop in hemoglobin.   History of type 2 diabetes mellitus, uncontrolled Blood glucose levels have stabilized. Currently only on sliding scale coverage. Oral agent was held due to low blood glucose levels at times. Patient to discuss this further with his PCP.  History of paroxysmal atrial fibrillation Stable. Heart rate is reasonably well controlled. Patient is not on anticoagulation due  to history of alcohol use. Coreg was reinitiated.  History of coronary artery disease status  post CABG Stable. Denies any chest pain.  History of chronic kidney disease stage III Renal function is stable.  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 Patient was recently evaluated by general surgery and underwent exploratory laparotomy and small bowel resection. He no longer has this fistula. Changes seen on the CT scan thought to be related to Silver nitrate was applied in the surgical office. He may follow-up with his surgeon.  History of hypothyroidism Stable. Continue levothyroxine  History of essential hypertension Blood pressure has improved. Coreg was reinitiated. Continue to monitor for now.   History of right breast cancer He is status post right total mastectomy in February 2016. He is on hormonal treatment, which will be continued.  Patient has improved. He was seen by PT and OT. They recommended skilled nursing facility for rehabilitation. However, patient and his daughter both want him to go home. He'll be discharged home today. Home health has been ordered.    PERTINENT LABS:  The results of significant diagnostics from this hospitalization (including imaging, microbiology, ancillary and laboratory) are listed below for reference.    Microbiology: Recent Results (from the past 240 hour(s))  Blood culture (routine x 2)     Status: None   Collection Time: 07/01/15  4:27 PM  Result Value Ref Range Status   Specimen Description BLOOD LEFT ARM  Final   Special Requests BOTTLES DRAWN AEROBIC AND ANAEROBIC 5ML  Final   Culture NO GROWTH 5 DAYS  Final   Report Status 07/06/2015 FINAL  Final  Urine culture     Status: None   Collection Time: 07/01/15  4:55 PM  Result Value Ref Range Status   Specimen Description URINE, RANDOM  Final   Special Requests NONE  Final   Culture MULTIPLE SPECIES PRESENT, SUGGEST RECOLLECTION  Final    Report Status 07/03/2015 FINAL  Final  Blood culture (routine x 2)     Status: None   Collection Time: 07/01/15  6:44 PM  Result Value Ref Range Status   Specimen Description BLOOD LEFT HAND  Final   Special Requests BOTTLES DRAWN AEROBIC AND ANAEROBIC 3CC  Final   Culture NO GROWTH 5 DAYS  Final   Report Status 07/06/2015 FINAL  Final  MRSA PCR Screening     Status: None   Collection Time: 07/01/15 10:12 PM  Result Value Ref Range Status   MRSA by PCR NEGATIVE NEGATIVE Final    Comment:        The GeneXpert MRSA Assay (FDA approved for NASAL specimens only), is one component of a comprehensive MRSA colonization surveillance program. It is not intended to diagnose MRSA infection nor to guide or monitor treatment for MRSA infections.      Labs: Basic Metabolic Panel:  Recent Labs Lab 07/02/15 0240 07/03/15 0220 07/04/15 0450 07/05/15 0414 07/06/15 0332 07/07/15 0249  NA 137 137 141 139 139 140  K 4.2 3.4* 3.5 2.9* 3.6 3.7  CL 101 100* 100* 96* 98* 100*  CO2 30 30 31  35* 33* 34*  GLUCOSE 118* 75 128* 122* 129* 132*  BUN 33* 33* 21* 16 22* 22*  CREATININE 1.34* 1.13 0.89 0.80 0.95 0.98  CALCIUM 8.2* 8.4* 9.1 9.0 9.2 9.2  MG 1.4*  --   --   --   --   --  PHOS 4.2  --   --   --   --   --    Liver Function Tests:  Recent Labs Lab 07/02/15 0240  AST 12*  ALT 7*  ALKPHOS 49  BILITOT 0.9  PROT 5.7*  ALBUMIN 2.4*   CBC:  Recent Labs Lab 07/01/15 1239 07/01/15 1850 07/03/15 0220 07/04/15 0450 07/05/15 0414  WBC 12.7* 13.3* 6.1 6.8 7.3  NEUTROABS 10.8* 11.1*  --   --   --   HGB 11.9* 10.3* 8.9* 10.6* 10.8*  HCT 38.9* 33.8* 29.2* 34.9* 35.6*  MCV 103.2* 102.4* 101.7* 102.6* 102.0*  PLT 120* 114* 88* 102* 110*   BNP: BNP (last 3 results)  Recent Labs  06/12/15 1112 06/13/15 0445 07/01/15 1627  BNP 474.3* 346.5* 331.3*     CBG:  Recent Labs Lab 07/06/15 1156 07/06/15 1603 07/06/15 2120 07/07/15 0626 07/07/15 1132  GLUCAP 143* 167* 145*  116* 170*     IMAGING STUDIES Ct Abdomen Pelvis Wo Contrast  07/01/2015   CLINICAL DATA:  Abdominal pain, sepsis  EXAM: CT ABDOMEN AND PELVIS WITHOUT CONTRAST  TECHNIQUE: Multidetector CT imaging of the abdomen and pelvis was performed following the standard protocol without IV contrast.  COMPARISON:  08/30/2014  FINDINGS: Lung bases are unremarkable. Atherosclerotic calcifications of coronary arteries.  Sagittal images of the spine shows degenerative changes thoracolumbar spine. The study is limited without IV contrast. Unenhanced liver shows no biliary ductal dilatation. Atherosclerotic calcifications of abdominal aorta and splenic artery. No aortic aneurysm.  Unenhanced pancreas, spleen and adrenal glands are unremarkable. Unenhanced kidneys are symmetrical in size. Bilateral multiple renal cysts are again noted. No nephrolithiasis. No hydronephrosis or hydroureter. No calcified ureteral calculi are noted. No calcified calculi are noted within urinary bladder. Prostate gland and seminal vesicles are unremarkable.  No small bowel obstruction. Moderate gas and stool noted in transverse colon. Colonic diverticula are noted in descending colon and sigmoid colon. There is no evidence of acute diverticulitis. Moderate gas noted in mid sigmoid colon. Colonic ileus or partial colonic obstruction cannot be excluded.  There is no pericecal inflammation. The terminal ileum is unremarkable.  Again noted postsurgical changes anterior abdominal wall. Again noted status post umbilical hernia repair. Significant scarring in umbilical region. There is high density material within scar and there is high-density material at the level of anterior aspect of the scar/wound see axial image 80. 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 Clinical correlation is necessary. There is no evidence of drainable abscess.  IMPRESSION: 1. Again noted postsurgical changes anterior abdominal wall. Again noted status post  umbilical hernia repair. Significant scarring in umbilical region. There is linear high density material within scar and there is high-density material at the level of anterior aspect of the scar/wound see axial image 80. 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 Clinical correlation is necessary. There is no evidence of drainable abscess.  2. Colonic diverticula are noted descending colon and sigmoid colon. No evidence of acute diverticulitis.  3.  No pericecal inflammation.  4. Stable bilateral renal cysts. No hydronephrosis or hydroureter. No small bowel obstruction.  5. Moderate gas noted within mid sigmoid colon. Colonic ileus or partial colonic obstruction cannot be excluded.   Electronically Signed   By: Lahoma Crocker M.D.   On: 07/01/2015 19:20   Dg Chest 2 View  07/04/2015   CLINICAL DATA:  Difficulty breathing.  EXAM: CHEST  2 VIEW  COMPARISON:  07/01/2015  FINDINGS: There stable changes of median sternotomy and CABG.  Cardiomediastinal silhouette is enlarged. Mediastinal contours appear intact. Atherosclerotic disease of the aorta is noted.  There is no evidence of pneumothorax. There are low lung volumes, with increased interstitial markings suggestive of pulmonary edema, and probably bilateral small pleural effusions. There is an opacity within the right suprahilar region seen on the frontal view with uncertain etiology.  Osseous structures are without acute abnormality. Soft tissues are grossly normal.  IMPRESSION: Worsening aeration of the lungs with pulmonary edema and probable bilateral pleural effusions.  Airspace opacity seen on the frontal view in the right suprahilar region. This may represent a confluence of enlarged vascular markings, however pulmonary mass cannot be excluded. Follow-up with chest CT may be considered.  These results will be called to the ordering clinician or representative by the Radiologist Assistant, and communication documented in the PACS or zVision  Dashboard.   Electronically Signed   By: Fidela Salisbury M.D.   On: 07/04/2015 10:51   Dg Chest 2 View  07/01/2015   CLINICAL DATA:  69 year old male with 2 day history of shortness of breath  EXAM: CHEST  2 VIEW  COMPARISON:  Prior chest x-ray 06/12/2015  FINDINGS: Patient is status post median sternotomy with evidence of multivessel CABG including LIMA bypass. Atherosclerotic calcification present in the transverse aorta. Similar appearance of low inspiratory volumes with bibasilar atelectasis and pulmonary vascular congestion. Overall, the appearance of the chest is unchanged. There may be trace bilateral pleural effusions. Diffuse bronchial wall thickening again noted. No acute osseous abnormality.  IMPRESSION: Very similar appearance of the chest compared to 06/12/2015 with pulmonary vascular congestion bordering on mild interstitial edema and trace bilateral effusions.  Inspiratory volumes remain low with bibasilar atelectasis.   Electronically Signed   By: Jacqulynn Cadet M.D.   On: 07/01/2015 17:24   Dg Chest 2 View  06/12/2015   CLINICAL DATA:  Worsening shortness of breath.  EXAM: CHEST  2 VIEW  COMPARISON:  PA and lateral chest 05/26/2015 and PA and lateral chest 10/02/2014.  FINDINGS: There is cardiomegaly and mild interstitial edema with small bilateral pleural effusions. No pneumothorax. The patient is status post CABG.  IMPRESSION: Mild interstitial edema with small bilateral pleural effusions.   Electronically Signed   By: Inge Rise M.D.   On: 06/12/2015 17:24   Dg Wrist Complete Right  07/01/2015   CLINICAL DATA:  Right wrist pain for 1 day  EXAM: RIGHT WRIST - COMPLETE 3+ VIEW  COMPARISON:  None.  FINDINGS: Four views of the right wrist submitted. No acute fracture or subluxation. Mild degenerative changes first carpometacarpal joint. Atherosclerotic calcifications of radial artery. Probable old avulsion fracture of triquetrum.  IMPRESSION: No acute fracture or subluxation.   Mild degenerative changes   Electronically Signed   By: Lahoma Crocker M.D.   On: 07/01/2015 17:29   Dg Hand Complete Right  07/01/2015   CLINICAL DATA:  Right wrist pain. Hand pain. No known injury. Gout.  EXAM: RIGHT HAND - COMPLETE 3+ VIEW  COMPARISON:  None.  FINDINGS: Soft tissue structures are unremarkable. Diffuse mild degenerative change. Periarticular erosions are noted about the capitate, base of the proximal phalanx of the right second digit, distal aspect of the proximal phalanx of the right second digit, and proximal aspect of the middle phalanx of the right second digit. These findings suggest possibility of gout or other crystal deposition disease. Other inflammatory arthropathies cannot be completely excluded. No evidence of fracture or dislocation.  IMPRESSION: 1. Periarticular erosions about the capitate, the  second MCP and second PIP joints. These changes suggest the possibility of gout. Other crystal induced or inflammatory arthritis cannot be excluded. 2. Diffuse degenerative change. 3. Peripheral vascular disease.   Electronically Signed   By: Marcello Moores  Register   On: 07/01/2015 17:26    DISCHARGE EXAMINATION: Filed Vitals:   07/06/15 1959 07/06/15 2025 07/07/15 0602 07/07/15 1225  BP: 152/75  101/65 130/85  Pulse: 87  90 85  Temp: 98 F (36.7 C)  97.7 F (36.5 C) 97.4 F (36.3 C)  TempSrc: Oral  Oral Oral  Resp: 18  19 18   Height:      Weight:   112 kg (246 lb 14.6 oz)   SpO2: 99% 99% 100% 92%   General appearance: alert, cooperative, appears stated age and no distress Resp: clear to auscultation bilaterally Cardio: regular rate and rhythm, S1, S2 normal, no murmur, click, rub or gallop GI: soft, non-tender; bowel sounds normal; no masses,  no organomegaly Extremities: Improved erythema bilateral lower extremities.  DISPOSITION: Home with home health  Discharge Instructions    Call MD for:  difficulty breathing, headache or visual disturbances    Complete by:  As  directed      Call MD for:  extreme fatigue    Complete by:  As directed      Call MD for:  persistant dizziness or light-headedness    Complete by:  As directed      Call MD for:  persistant nausea and vomiting    Complete by:  As directed      Call MD for:  temperature >100.4    Complete by:  As directed      Diet Carb Modified    Complete by:  As directed      Discharge instructions    Complete by:  As directed   Please follow up with your PCP to evaluate the left 3rd toe. Hold your diabetes medication for now. Ask your PCP for further instructions.  You were cared for by a hospitalist during your hospital stay. If you have any questions about your discharge medications or the care you received while you were in the hospital after you are discharged, you can call the unit and asked to speak with the hospitalist on call if the hospitalist that took care of you is not available. Once you are discharged, your primary care physician will handle any further medical issues. Please note that NO REFILLS for any discharge medications will be authorized once you are discharged, as it is imperative that you return to your primary care physician (or establish a relationship with a primary care physician if you do not have one) for your aftercare needs so that they can reassess your need for medications and monitor your lab values. If you do not have a primary care physician, you can call [phone] for a physician referral.     Increase activity slowly    Complete by:  As directed            ALLERGIES:  Allergies  Allergen Reactions  . Erythromycin Hives    Oramycin     Current Discharge Medication List    START taking these medications   Details  doxycycline (VIBRA-TABS) 100 MG tablet Take 1 tablet (100 mg total) by mouth every 12 (twelve) hours. For 8 more days Qty: 16 tablet, Refills: 0    levofloxacin (LEVAQUIN) 500 MG tablet Take 1 tablet (500 mg total) by mouth daily. For 8 more  days.  Qty: 8 tablet, Refills: 0    neomycin-bacitracin-polymyxin (NEOSPORIN) OINT Apply 1 application topically 2 (two) times daily. To left 3rd toe Qty: 14 g, Refills: 0      CONTINUE these medications which have CHANGED   Details  carvedilol (COREG) 3.125 MG tablet Take 2 tablets (6.25 mg total) by mouth 2 (two) times daily with a meal. Qty: 60 tablet, Refills: 1    furosemide (LASIX) 80 MG tablet Take 1 tablet (80 mg total) by mouth 2 (two) times daily. Qty: 90 tablet, Refills: 3   Associated Diagnoses: Acute on chronic diastolic HF (heart failure)      CONTINUE these medications which have NOT CHANGED   Details  albuterol-ipratropium (COMBIVENT) 18-103 MCG/ACT inhaler Inhale 2 puffs into the lungs 2 (two) times daily. Qty: 1 Inhaler, Refills: 1    allopurinol (ZYLOPRIM) 300 MG tablet Take 1 tablet (300 mg total) by mouth daily. Qty: 30 tablet, Refills: 3    aspirin EC 325 MG EC tablet Take 1 tablet (325 mg total) by mouth daily. Qty: 30 tablet, Refills: 0    atorvastatin (LIPITOR) 20 MG tablet Take 1 tablet (20 mg total) by mouth daily. Qty: 30 tablet, Refills: 1    colchicine 0.6 MG tablet Take 0.6 mg by mouth daily.    FLUoxetine (PROZAC) 20 MG capsule Take 1 capsule (20 mg total) by mouth daily. Qty: 30 capsule, Refills: 1    fluticasone (FLOVENT HFA) 110 MCG/ACT inhaler Inhale 2 puffs into the lungs 2 (two) times daily.    folic acid (FOLVITE) 1 MG tablet Take 1 tablet (1 mg total) by mouth daily. Qty: 30 tablet, Refills: 1    gabapentin (NEURONTIN) 300 MG capsule Take 1 capsule (300 mg total) by mouth 2 (two) times daily. Qty: 60 capsule, Refills: 1    levothyroxine (SYNTHROID, LEVOTHROID) 125 MCG tablet Take 1.5 tablets (187 mcg total) by mouth daily before breakfast. Qty: 30 tablet, Refills: 3    magnesium oxide (MAG-OX) 400 (241.3 MG) MG tablet Take 1 tablet (400 mg total) by mouth 2 (two) times daily. Qty: 60 tablet, Refills: 11   Associated Diagnoses:  Chronic diastolic heart failure; Chronic diastolic congestive heart failure    omeprazole (PRILOSEC) 20 MG capsule Take 1 capsule (20 mg total) by mouth daily. Qty: 30 capsule, Refills: 1    Oxycodone HCl 10 MG TABS Take 10 mg by mouth 3 (three) times daily as needed (pain).    potassium chloride SA (KLOR-CON M20) 20 MEQ tablet Take 1 tablet (20 mEq total) by mouth as directed. 40 MEQ IN THE MORNING AND 20 MEQ IN THE PM Qty: 90 tablet, Refills: 3   Associated Diagnoses: Acute on chronic diastolic HF (heart failure)    tamoxifen (NOLVADEX) 20 MG tablet Take 1 tablet (20 mg total) by mouth daily. Qty: 90 tablet, Refills: 3   Associated Diagnoses: Cancer of male breast, right    venlafaxine XR (EFFEXOR-XR) 37.5 MG 24 hr capsule TAKE ONE CAPSULE BY MOUTH DAILY Qty: 30 capsule, Refills: 3      STOP taking these medications     glimepiride (AMARYL) 1 MG tablet        Follow-up Information    Schedule an appointment as soon as possible for a visit with Harl Bowie, MD.   Specialty:  General Surgery   Why:  For post-operation check.  Call to confirm appointment dates/timesOffice Will Call   Contact information:   [address] [place] [address] [phone]  Follow up with GIBSON,DAVID, MD. Schedule an appointment as soon as possible for a visit in 4 days.   Specialty:  Family Medicine   Why:  to evaluate your toe and discuss diabetes and post hospital stay follow upOFFice Will Call   Contact information:   [address] [place] [address] [phone]       Follow up with [facility].   Why:  Home Health RN, Physical Therapy, and Occupational Therapy   Contact information:   [address] [place] [place] [address] [phone]       Follow up with Richardson Dopp, PA-C On 07/15/2015.   Specialties:  Physician Assistant, Radiology, Interventional Cardiology   Why:  @11 ;30am   Contact  information:   [address] [address] [phone]       TOTAL DISCHARGE TIME: 35 minutes  [facility] Pager [phone]  07/07/2015, 1:47 PM

## 2015-07-08 LAB — GLUCOSE, CAPILLARY
Glucose-Capillary: 123 mg/dL — ABNORMAL HIGH (ref 65–99)
Glucose-Capillary: 90 mg/dL (ref 65–99)

## 2015-07-14 ENCOUNTER — Other Ambulatory Visit: Payer: Self-pay | Admitting: Hematology and Oncology

## 2015-07-14 ENCOUNTER — Encounter: Payer: Self-pay | Admitting: *Deleted

## 2015-07-14 NOTE — Progress Notes (Signed)
Cardiology Office Note   Date:  07/15/2015   ID:  DEVRIN MONFORTE, DOB Jun 03, 1946, MRN [id number]  PCP:  Jene Every, MD  Cardiologist:  Dr. Lauree Chandler   CHF:  Dr. Loralie Champagne   Chief Complaint  Patient presents with  . Hospitalization Follow-up  . Congestive Heart Failure  . Coronary Artery Disease  . Atrial Fibrillation     History of Present Illness: [name] is a 69 y.o. male with a hx of CAD, status post CABG in 2001, atrial fibrillation, COPD, sleep apnea, CKD stage III, diastolic CHF, carotid stenosis s/p L CEA. Patient is not a Coumadin candidate secondary to history of alcohol abuse. He was admitted 04/2014 with acute on chronic diastolic CHF in the setting of acute on chronic oliguric renal failure. The patient was placed on IV Lasix with worsening creatinine and no increase in urine output. He was felt to have cardiorenal syndrome. He was seen by nephrology and underwent temporary hemodialysis. Creatinine improved back to baseline.  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  Myoview 05/2014 was neg for ischemia.  He has a hx of breast CA and underwent mastectomy.  Last seen by Dr. Lauree Chandler 02/2015.    Admitted 8/15-8/18 with generalized weakness in the setting of metabolic derangement with severe hypokalemia and hypomagnesemia. This was felt to be multifactorial and related to diuretic use as well as alcohol intake.   Admitted 8/22-8/29. 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 He therefore was brought in and underwent exploratory laparotomy with small bowel resection and incisional hernia repair with Dr. Ninfa Linden. He was discharged to inpatient rehabilitation. He developed volume excess while in inpatient rehabilitation requiring IV Lasix. Echocardiogram demonstrated normal LV function, mild aortic stenosis and moderate biatrial  enlargement.   I saw him 9/14 in FU.   He remained volume overloaded and I adjusted his Lasix.  His BP was low and I stopped his Losartan.    He was then admitted 9/20-9/26 with sepsis in the setting of lower extremity cellulitis. With fluid resuscitation, he developed acute on chronic diastolic CHF with pulmonary edema. He was treated with IV Lasix.  Since DC, he notes stable breathing.  Denies chest pain, syncope.  Denies orthopnea, PND.  He notes LE edema is stable.  He cannot weigh at home.  His daughter notes that his L LE erythema is worse over the past few days.  Denies fever, chills.  He finished his antibiotics yesterday (Levaquin and Doxycycline).   Studies: - LHC (11/01): 3 vessel CAD >>> CABG - Nuclear (9/14): Inferolateral, no ischemia, not gated, probable low risk scan >>> med rx - Carotid US (5/15): Bilateral ICA 60-79%, >50% RECA- f/u 66-months - Echo (7/15): EF 50-55%, MAC, moderate to severe LAE, moderate RVH, moderate RAE  - Nuclear (8/15):  Normal, EF 63%  - Carotid US (6/16):  Bilateral ICA 40-59%; > 50% bilat ECA, L VA occluded, FU 1 year  - Echo (06/12/15):  EF 60-65%, no RWMA, mild AS (mean 12 mmHg), MAC, mod LAE, mod RAE  - Venous Duplex (07/03/15):  Neg for DVT bilaterally    Past Medical History  Diagnosis Date  . CHRONIC OBSTRUCTIVE PULMONARY DISEASE 06/20/2009  . CAROTID STENOSIS 06/20/2009    A. 08/2001 s/p L CEA;  B.   09/14/11 - Carotid U/S - 40-59% bilateral stenosis, left CEA patch angioplasty is patent  .  DM 06/20/2009  . CAD 06/20/2009    A.  08/2000 - s/p CABG x 4 - LIMA-LAD, Left Radial-OM, VG-DIAG, VG-RCA;  B. Neg. MV  2010  . HYPERLIPIDEMIA 06/20/2009  . HYPERTENSION 06/20/2009  . Hypothyroidism   . Low back pain   . Asthma     as child  . Pneumonia   . Persistent atrial fibrillation (HCC)     Not felt to be coumadin candidate 2/2 ETOH use.  Marland Kitchen History of tobacco abuse     remote - quit 1970  . Morbidly obese ([place])   . Chronic diastolic heart  failure, NYHA class 3 ([place])     Followed by CHF Clinic --> Echo 04/2014: EF 32-67%, Diastolic DysFxn - elevated LVEDP & LAP. Mod Dil LA & RA.  . Falls frequently   . Hx of cardiovascular stress test 05/2014    Lexiscan Myoview (8/15):  No ischemia; EF 63% - Normal Study  . Breast cancer, right breast ([place])   . Gout   . OBSTRUCTIVE SLEEP APNEA 06/20/2009    does not use cpap  . Stroke [facility])     found on MRI in Oct. 2015.   Marland Kitchen Shortness of breath dyspnea     occasional  . Depression     takes Prozac  . GERD (gastroesophageal reflux disease)     takes Prilosec  . Arthritis   . Neuromuscular disorder (HCC)     neuropathy in both legs  . Constipation   . ETOH abuse     no alcohol since Oct. 2015  . 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 CHF (congestive heart failure) ([place])   . Supplemental oxygen dependent     hs  . Bilateral renal cysts   . Kidney disease     Stage 3    Past Surgical History  Procedure Laterality Date  . Carotid endarterectomy  2002    left  . Coronary artery bypass graft      x 4 - 2001  . Umbilical hernia repair N/A 01/28/2014    Procedure: HERNIA REPAIR UMBILICAL ADULT/INC;  Surgeon: Harl Bowie, MD;  Location: [place];  Service: General;  Laterality: N/A;  . Laparotomy N/A 01/28/2014    Procedure: EXPLORATORY LAPAROTOMY;  Surgeon: Harl Bowie, MD;  Location: [place];  Service: General;  Laterality: N/A;  . Bowel resection N/A 01/28/2014    Procedure: SMALL BOWEL RESECTION;  Surgeon: Harl Bowie, MD;  Location: [place];  Service: General;  Laterality: N/A;  . Hernia repair    . Colonoscopy    . Total mastectomy Right 11/11/2014    Procedure: RIGHT TOTAL MASTECTOMY;  Surgeon: Excell Seltzer, MD;  Location: [place];  Service: General;  Laterality: Right;  . Cataract extraction w/phaco Left 04/08/2015    Procedure: CATARACT EXTRACTION PHACO AND INTRAOCULAR LENS PLACEMENT ([place]);  Surgeon: Birder Robson, MD;  Location: [facility];  Service: Ophthalmology;  Laterality: Left;  [place] 00:36 AP% 23.7 CDE 8.72 fluid pack [id number] H  . Eye surgery Left     Catarct  . Laparotomy N/A 06/02/2015    Procedure: EXPLORATORY LAPAROTOMY;  Surgeon: Coralie Keens, MD;  Location: [place];  Service: General;  Laterality: N/A;  . Bowel resection N/A 06/02/2015    Procedure: SMALL BOWEL RESECTION;  Surgeon: Coralie Keens, MD;  Location: [place];  Service: General;  Laterality: N/A;     Current Outpatient Prescriptions  Medication Sig  Dispense Refill  . albuterol-ipratropium (COMBIVENT) 18-103 MCG/ACT inhaler Inhale 2 puffs into the lungs 2 (two) times daily. 1 Inhaler 1  . allopurinol (ZYLOPRIM) 300 MG tablet Take 1 tablet (300 mg total) by mouth daily. 30 tablet 3  . aspirin EC 325 MG EC tablet Take 1 tablet (325 mg total) by mouth daily. 30 tablet 0  . atorvastatin (LIPITOR) 20 MG tablet Take 1 tablet (20 mg total) by mouth daily. 30 tablet 1  . carvedilol (COREG) 3.125 MG tablet Take 2 tablets (6.25 mg total) by mouth 2 (two) times daily with a meal. 60 tablet 1  . colchicine 0.6 MG tablet Take 0.6 mg by mouth daily.    Marland Kitchen doxycycline (VIBRA-TABS) 100 MG tablet Take 1 tablet (100 mg total) by mouth every 12 (twelve) hours. For 6 more days 12 tablet 0  . FLUoxetine (PROZAC) 20 MG capsule Take 1 capsule (20 mg total) by mouth daily. 30 capsule 1  . fluticasone (FLOVENT HFA) 110 MCG/ACT inhaler Inhale 2 puffs into the lungs 2 (two) times daily.    . folic acid (FOLVITE) 1 MG tablet Take 1 tablet (1 mg total) by mouth daily. 30 tablet 1  . furosemide (LASIX) 80 MG tablet Take 1 tablet (80 mg total) by mouth as directed. take 120 mg in AM and 80 mg in afternoon 60 tablet 11  . gabapentin (NEURONTIN) 300 MG capsule Take 1 capsule (300 mg total) by mouth 2 (two) times daily. 60 capsule 1  . levothyroxine (SYNTHROID, LEVOTHROID) 125 MCG tablet Take 1.5 tablets (187 mcg total) by mouth daily before breakfast.  30 tablet 3  . magnesium oxide (MAG-OX) 400 (241.3 MG) MG tablet Take 1 tablet (400 mg total) by mouth 2 (two) times daily. 60 tablet 11  . neomycin-bacitracin-polymyxin (NEOSPORIN) OINT Apply 1 application topically 2 (two) times daily. To left 3rd toe 14 g 0  . omeprazole (PRILOSEC) 20 MG capsule Take 1 capsule (20 mg total) by mouth daily. 30 capsule 1  . Oxycodone HCl 10 MG TABS Take 10 mg by mouth 3 (three) times daily as needed (pain).    . potassium chloride SA (KLOR-CON M20) 20 MEQ tablet Take 2 tablets (40 mEq total) by mouth 2 (two) times daily. 60 tablet 6  . tamoxifen (NOLVADEX) 20 MG tablet Take 1 tablet (20 mg total) by mouth daily. (Patient taking differently: Take 20 mg by mouth at bedtime. ) 90 tablet 3  . venlafaxine XR (EFFEXOR-XR) 37.5 MG 24 hr capsule TAKE ONE CAPSULE BY MOUTH DAILY 30 capsule 6  . furosemide (LASIX) 40 MG tablet Take 1 tablet (40 mg total) by mouth daily. Pt take 120 mg in AM and 80 mg in PM 30 tablet 11  . levofloxacin (LEVAQUIN) 500 MG tablet Take 1 tablet (500 mg total) by mouth daily. For 6 more days. 6 tablet 0  . metolazone (ZAROXOLYN) 5 MG tablet Take 5 mg every M,W,F 30 tablet 3   No current facility-administered medications for this visit.    Allergies:   Erythromycin    Social History:  The patient  reports that he quit smoking about 46 years ago. He has never used smokeless tobacco. He reports that he drinks alcohol. He reports that he uses illicit drugs (Marijuana).   Family History:  The patient's family history includes CVA in his father and mother; Cancer in his mother; Stomach cancer (age of onset: 12) in his brother. There is no history of Heart attack.    ROS:  Please see the history of present illness.   Review of Systems  Eyes: Positive for visual disturbance.  Cardiovascular: Positive for dyspnea on exertion, irregular heartbeat and leg swelling.  Respiratory: Positive for shortness of breath.   Musculoskeletal: Positive for  back pain, joint pain and myalgias.  Gastrointestinal: Positive for constipation.  Neurological: Positive for loss of balance.  All other systems reviewed and are negative.     PHYSICAL EXAM: VS:  BP 112/50 mmHg  Pulse 63  Ht 5\' 8"  (1.727 m)  Wt 244 lb 6.4 oz (110.859 kg)  BMI 37.17 kg/m2  SpO2 81%    Wt Readings from Last 3 Encounters:  07/15/15 244 lb 6.4 oz (110.859 kg)  07/07/15 246 lb 14.6 oz (112 kg)  06/25/15 249 lb 1.9 oz (113 kg)     GEN: chronically ill appearing male arriving in a wheelchair, in no acute distress HEENT: normal Neck: no JVD at 90, no masses Cardiac:  Normal S1/S2, irregularly irregular rhythm; no murmur ,  no rubs or gallops, 2+ bilateral LE edema up to the knee (L >R) Respiratory:  Decreased breath sounds bilaterally with bibasilar crackles, no wheezing, rhonchi   GI: soft, nontender, nondistended, + BS MS: no deformity or atrophy Skin: area of erythema from ankle to just below the knee on the L with warmth to the touch  Neuro:  CNs II-XII intact, Strength and sensation are intact Psych: Normal affect   EKG:  EKG is ordered today.  It demonstrates:   Atrial fibrillation, HR 76, QTC 450 ms   Recent Labs: 07/21/2014: Pro B Natriuretic peptide (BNP) 3542.0* 10/03/2014: TSH 1.941 07/01/2015: B Natriuretic Peptide 331.3* 07/02/2015: ALT 7*; Magnesium 1.4* 07/05/2015: Hemoglobin 10.8*; Platelets 110* 07/07/2015: BUN 22*; Creatinine, Ser 0.98; Potassium 3.7; Sodium 140    Lipid Panel    Component Value Date/Time   CHOL 100 08/02/2014 0524   TRIG 88 08/02/2014 0524   HDL 34* 08/02/2014 0524   CHOLHDL 2.9 08/02/2014 0524   VLDL 18 08/02/2014 0524   LDLCALC 48 08/02/2014 0524      ASSESSMENT AND PLAN:    1. Acute on Chronic Diastolic CHF:  He remains volume overloaded.  His LE edema is related to a combination of HF and venous insufficiency.  He still has crackles on exam in his lung bases.  He was previously followed by HF Clinic and was one  time on Lasix 160 bid and Metolazone 1 x week.    -  Increase Lasix to 120 mg QAM, 80 mg QPM  -  Change Metolazone to 5 mg Q Mon,Wed,Fri.  -  BMET, BNP today  -  BMET 1 week  -  Close clinical FU with me  -  Refer back to HF Clinic for continued management.  2. Cellulitis:  He has chronic L > R edema.  The redness in his leg on the L seems to be worse since DC from the hospital.  Venous duplex was neg for DVT bilaterally 07/03/15.  He has some chronic stasis changes but the erythema on the L is clearly worse than the R.  -  Extend antibiotics for 6 more days (total of 2 weeks) with Doxycycline 100 mg BID and Levaquin 500 mg QD.  -  Refer to the Wound Clinic for management of cellulitis.  He would likely benefit from [employer].  -  Question if he could get further care with Home Health - will leave up to wound clinic to arrange if appropriate.  2. CAD s/p CABG:  No angina. Continue aspirin, statin, beta blocker.  3. Atrial Fibrillation:  Chronic. Rate controlled. He has been deemed a poor candidate for anticoagulation in the past because of alcohol abuse. We can consider resuming anticoagulation in the future if he remains off of alcohol.  4. Carotid Stenosis:  Stable bilateral disease by ultrasound 6/16. Follow-up should be planned in 03/2016.  5. ETOH Abuse:  He denies alcohol abuse in the recent past.  6. HTN:   Controlled.   7. Chronic Kidney Disease:  Obtain BMET today.  8. Hyperlipidemia:  Continue statin.  9. Breast cancer: Status post mastectomy. He has been treated with tamoxifen. He is followed by oncology.  10. S/p SB resection:  Follow up with Gen Surgery as planned.     Medication Changes: Current medicines are reviewed at length with the patient today.  Concerns regarding medicines are as outlined above.  The following changes have been made:   Discontinued Medications   No medications on file   Modified Medications   Modified Medication Previous Medication    DOXYCYCLINE (VIBRA-TABS) 100 MG TABLET doxycycline (VIBRA-TABS) 100 MG tablet      Take 1 tablet (100 mg total) by mouth every 12 (twelve) hours. For 6 more days    Take 1 tablet (100 mg total) by mouth every 12 (twelve) hours. For 8 more days   FUROSEMIDE (LASIX) 80 MG TABLET furosemide (LASIX) 80 MG tablet      Take 1 tablet (80 mg total) by mouth as directed. take 120 mg in AM and 80 mg in afternoon    Take 1 tablet (80 mg total) by mouth 2 (two) times daily.   LEVOFLOXACIN (LEVAQUIN) 500 MG TABLET levofloxacin (LEVAQUIN) 500 MG tablet      Take 1 tablet (500 mg total) by mouth daily. For 6 more days.    Take 1 tablet (500 mg total) by mouth daily. For 8 more days.   POTASSIUM CHLORIDE SA (KLOR-CON M20) 20 MEQ TABLET potassium chloride SA (KLOR-CON M20) 20 MEQ tablet      Take 2 tablets (40 mEq total) by mouth 2 (two) times daily.    Take 1 tablet (20 mEq total) by mouth as directed. 40 MEQ IN THE MORNING AND 20 MEQ IN THE PM   New Prescriptions   FUROSEMIDE (LASIX) 40 MG TABLET    Take 1 tablet (40 mg total) by mouth daily. Pt take 120 mg in AM and 80 mg in PM   METOLAZONE (ZAROXOLYN) 5 MG TABLET    Take 5 mg every M,W,F   Labs/ tests ordered today include:   Orders Placed This Encounter  Procedures  . B Nat Peptide  . Basic Metabolic Panel (BMET)  . Basic Metabolic Panel (BMET)  . Ambulatory referral to Wound Clinic  . EKG 12-Lead     Disposition:    FU with 1 week with me.  Refer back to CHF Clinic for FU in the next 3-4 weeks.  Refer to Wound Clinic for management of LE cellulitis.      Signed, Versie Starks, MHS 07/15/2015 4:51 PM    [facility] [address], [place], [place]  [address] Phone: [phone]; Fax: [phone]

## 2015-07-14 NOTE — Telephone Encounter (Signed)
Last OV 01/22/15.  Next OV 07/28/15.  Chart reviewed.

## 2015-07-15 ENCOUNTER — Ambulatory Visit (INDEPENDENT_AMBULATORY_CARE_PROVIDER_SITE_OTHER): Payer: Medicare Other | Admitting: Physician Assistant

## 2015-07-15 ENCOUNTER — Encounter: Payer: Self-pay | Admitting: Physician Assistant

## 2015-07-15 VITALS — BP 112/50 | HR 63 | Ht 68.0 in | Wt 244.4 lb

## 2015-07-15 DIAGNOSIS — I10 Essential (primary) hypertension: Secondary | ICD-10-CM

## 2015-07-15 DIAGNOSIS — E785 Hyperlipidemia, unspecified: Secondary | ICD-10-CM

## 2015-07-15 DIAGNOSIS — I482 Chronic atrial fibrillation, unspecified: Secondary | ICD-10-CM

## 2015-07-15 DIAGNOSIS — R0602 Shortness of breath: Secondary | ICD-10-CM | POA: Diagnosis not present

## 2015-07-15 DIAGNOSIS — F1010 Alcohol abuse, uncomplicated: Secondary | ICD-10-CM

## 2015-07-15 DIAGNOSIS — I2510 Atherosclerotic heart disease of native coronary artery without angina pectoris: Secondary | ICD-10-CM

## 2015-07-15 DIAGNOSIS — I5033 Acute on chronic diastolic (congestive) heart failure: Secondary | ICD-10-CM | POA: Diagnosis not present

## 2015-07-15 DIAGNOSIS — L03116 Cellulitis of left lower limb: Secondary | ICD-10-CM

## 2015-07-15 DIAGNOSIS — I6523 Occlusion and stenosis of bilateral carotid arteries: Secondary | ICD-10-CM

## 2015-07-15 LAB — BASIC METABOLIC PANEL
BUN: 51 mg/dL — ABNORMAL HIGH (ref 7–25)
CHLORIDE: 95 mmol/L — AB (ref 98–110)
CO2: 32 mmol/L — AB (ref 20–31)
Calcium: 9 mg/dL (ref 8.6–10.3)
Creat: 1.73 mg/dL — ABNORMAL HIGH (ref 0.70–1.18)
GLUCOSE: 132 mg/dL — AB (ref 65–99)
POTASSIUM: 4.7 mmol/L (ref 3.5–5.3)
SODIUM: 139 mmol/L (ref 135–146)

## 2015-07-15 MED ORDER — METOLAZONE 5 MG PO TABS: ORAL_TABLET | ORAL | Status: DC

## 2015-07-15 MED ORDER — LEVOFLOXACIN 500 MG PO TABS: 500.0000 mg | ORAL_TABLET | Freq: Every day | ORAL | Status: DC

## 2015-07-15 MED ORDER — FUROSEMIDE 80 MG PO TABS: 80.0000 mg | ORAL_TABLET | Freq: Every day | ORAL | Status: DC

## 2015-07-15 MED ORDER — FUROSEMIDE 40 MG PO TABS: 40.0000 mg | ORAL_TABLET | Freq: Every day | ORAL | Status: DC

## 2015-07-15 MED ORDER — DOXYCYCLINE HYCLATE 100 MG PO TABS: 100.0000 mg | ORAL_TABLET | Freq: Two times a day (BID) | ORAL | Status: DC

## 2015-07-15 MED ORDER — FUROSEMIDE 80 MG PO TABS: 80.0000 mg | ORAL_TABLET | ORAL | Status: DC

## 2015-07-15 MED ORDER — POTASSIUM CHLORIDE CRYS ER 20 MEQ PO TBCR: 40.0000 meq | EXTENDED_RELEASE_TABLET | Freq: Two times a day (BID) | ORAL | Status: DC

## 2015-07-15 NOTE — Patient Instructions (Addendum)
Your physician recommends that you schedule a follow-up appointment in: 1-2 Weeks with Vevelyn Royals  Your physician recommends that you schedule a follow-up appointment in: 3-4 Weeks at [facility] with Dr Haroldine Laws  You have been referred to [place] physician has recommended you make the following change in your medication: Increase Lasix 120 mg in the morning and 80 mg at night, take Metolazone 5 mg every Monday, Wednesday and Friday, Increase Potassium 40 mg twice a day, Take Doxycycline 100 mg twice a day for 6 days and Levaquin 500 mg daily for 6 days  Your physician recommends that you return for lab work in: Today BMP and BNP  Your physician recommends that you return for lab work in: 1 Week BMP

## 2015-07-16 ENCOUNTER — Telehealth: Payer: Self-pay | Admitting: *Deleted

## 2015-07-16 DIAGNOSIS — I5033 Acute on chronic diastolic (congestive) heart failure: Secondary | ICD-10-CM

## 2015-07-16 LAB — BRAIN NATRIURETIC PEPTIDE: BRAIN NATRIURETIC PEPTIDE: 208.8 pg/mL — AB (ref 0.0–100.0)

## 2015-07-16 MED ORDER — POTASSIUM CHLORIDE CRYS ER 20 MEQ PO TBCR: 40.0000 meq | EXTENDED_RELEASE_TABLET | ORAL | Status: DC

## 2015-07-16 NOTE — Telephone Encounter (Signed)
DPR on file for daughter [name] who has been notified of lab results with med changes by phone. Stop metolazone, hold PM dose of lasix today only, resume on 10/6 lasix 120 A/80 P; decrease K+ 40 meq AM/20 meq PM,. BMET 10/11 w/PA appt. [name] verbalized ok.

## 2015-07-21 NOTE — Progress Notes (Signed)
Cardiology Office Note   Date:  07/22/2015   ID:  [name], DOB Jul 31, 1946, MRN [id number]  PCP:  Jene Every, MD  Cardiologist:  Dr. Lauree Chandler   CHF:  Dr. Loralie Champagne   Chief Complaint  Patient presents with  . Follow-up  . Congestive Heart Failure     History of Present Illness: [name] is a 69 y.o. 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 Patient is not a Coumadin candidate secondary to history of alcohol abuse. He was admitted 04/2014 with acute on chronic diastolic CHF and developed cardiorenal syndrome requiring temporary hemodialysis.  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  Myoview 05/2014 was neg for ischemia.  He has a hx of breast CA and underwent mastectomy.   He has been admitted several times over the past several weeks.  In mid August, he was admitted with generalized weakness in the setting of metabolic derangement with severe hypokalemia and hypomagnesemia related to diuretic use as well as alcohol intake.   Admitted 8/22-8/29 for exploratory laparotomy with small bowel resection and incisional hernia repair with Dr. Ninfa Linden. He was discharged to inpatient rehabilitation. He developed volume excess while in inpatient rehabilitation requiring IV Lasix. Echocardiogram demonstrated normal LV function, mild aortic stenosis and moderate biatrial enlargement.   I saw him 9/14 in FU and stopped his Losartan b/c of low BP.    He was then admitted 9/20-9/26 with sepsis in the setting of lower extremity cellulitis. With fluid resuscitation, he developed acute on chronic diastolic CHF with pulmonary edema.   I saw him last week.  He was still volume overloaded.  I adjusted his diuretics.  But, his Creatinine worsened with diuresis and I had to cut back on his diuretic  regimen.  I resumed his antibiotics for a total of 2 weeks of therapy.  He was referred to the wound clinic for help with management of his cellulitis.  He returns for FU.  Here with his daughter.  LE edema is unchanged.  Redness in both legs seems the same. No fevers. He is fairly sedentary. His breathing is stable. Denies chest pain.  He sleeps on 2-3 pillows without change.  He denies coughing or wheezing.  He denies syncope.     Studies: - LHC (11/01): 3 vessel CAD >>> CABG - Nuclear (9/14): Inferolateral, no ischemia, not gated, probable low risk scan >>> med rx - Carotid US (5/15): Bilateral ICA 60-79%, >50% RECA- f/u 61-months - Echo (7/15): EF 50-55%, MAC, moderate to severe LAE, moderate RVH, moderate RAE  - Nuclear (8/15):  Normal, EF 63%  - Carotid US (6/16):  Bilateral ICA 40-59%; > 50% bilat ECA, L VA occluded, FU 1 year  - Echo (06/12/15):  EF 60-65%, no RWMA, mild AS (mean 12 mmHg), MAC, mod LAE, mod RAE  - Venous Duplex (07/03/15):  Neg for DVT bilaterally    Past Medical History  Diagnosis Date  . CHRONIC OBSTRUCTIVE PULMONARY DISEASE 06/20/2009  . CAROTID STENOSIS 06/20/2009    A. 08/2001 s/p L CEA;  B.   09/14/11 - Carotid U/S - 40-59% bilateral stenosis, left CEA patch angioplasty is patent  . DM 06/20/2009  . CAD 06/20/2009    A.  08/2000 - s/p CABG x 4 - LIMA-LAD, Left Radial-OM, VG-DIAG, VG-RCA;  B. Neg. MV  2010  . HYPERLIPIDEMIA 06/20/2009  . HYPERTENSION 06/20/2009  . Hypothyroidism   . Low back pain   . Asthma     as child  . Pneumonia   . Persistent atrial fibrillation (HCC)     Not felt to be coumadin candidate 2/2 ETOH use.  Marland Kitchen History of tobacco abuse     remote - quit 1970  . Morbidly obese ([place])   . Chronic diastolic heart failure, NYHA class 3 ([place])     Followed by CHF Clinic --> Echo 04/2014: EF 41-74%, Diastolic DysFxn - elevated LVEDP & LAP. Mod Dil LA & RA.  . Falls frequently   . Hx of cardiovascular stress test 05/2014    Lexiscan Myoview  (8/15):  No ischemia; EF 63% - Normal Study  . Breast cancer, right breast ([place])   . Gout   . OBSTRUCTIVE SLEEP APNEA 06/20/2009    does not use cpap  . Stroke [facility])     found on MRI in Oct. 2015.   Marland Kitchen Shortness of breath dyspnea     occasional  . Depression     takes Prozac  . GERD (gastroesophageal reflux disease)     takes Prilosec  . Arthritis   . Neuromuscular disorder (HCC)     neuropathy in both legs  . Constipation   . ETOH abuse     no alcohol since Oct. 2015  . 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 CHF (congestive heart failure) ([place])   . Supplemental oxygen dependent     hs  . Bilateral renal cysts   . Kidney disease     Stage 3    Past Surgical History  Procedure Laterality Date  . Carotid endarterectomy  2002    left  . Coronary artery bypass graft      x 4 - 2001  . Umbilical hernia repair N/A 01/28/2014    Procedure: HERNIA REPAIR UMBILICAL ADULT/INC;  Surgeon: Harl Bowie, MD;  Location: [place];  Service: General;  Laterality: N/A;  . Laparotomy N/A 01/28/2014    Procedure: EXPLORATORY LAPAROTOMY;  Surgeon: Harl Bowie, MD;  Location: [place];  Service: General;  Laterality: N/A;  . Bowel resection N/A 01/28/2014    Procedure: SMALL BOWEL RESECTION;  Surgeon: Harl Bowie, MD;  Location: [place];  Service: General;  Laterality: N/A;  . Hernia repair    . Colonoscopy    . Total mastectomy Right 11/11/2014    Procedure: RIGHT TOTAL MASTECTOMY;  Surgeon: Excell Seltzer, MD;  Location: [place];  Service: General;  Laterality: Right;  . Cataract extraction w/phaco Left 04/08/2015    Procedure: CATARACT EXTRACTION PHACO AND INTRAOCULAR LENS PLACEMENT ([place]);  Surgeon: Birder Robson, MD;  Location: [facility];  Service: Ophthalmology;  Laterality: Left;  [place] 00:36 AP% 23.7 CDE 8.72 fluid pack [id number]  . Eye surgery Left     Catarct  . Laparotomy N/A 06/02/2015    Procedure: EXPLORATORY  LAPAROTOMY;  Surgeon: Coralie Keens, MD;  Location: [place];  Service: General;  Laterality: N/A;  . Bowel resection N/A 06/02/2015    Procedure: SMALL BOWEL RESECTION;  Surgeon: Coralie Keens, MD;  Location: [place];  Service: General;  Laterality: N/A;     Current Outpatient Prescriptions  Medication Sig Dispense Refill  . albuterol-ipratropium (COMBIVENT) 18-103 MCG/ACT inhaler Inhale 2 puffs into the lungs 2 (two) times daily. 1 Inhaler 1  . allopurinol (ZYLOPRIM) 300 MG  tablet Take 1 tablet (300 mg total) by mouth daily. 30 tablet 3  . aspirin EC 325 MG EC tablet Take 1 tablet (325 mg total) by mouth daily. 30 tablet 0  . atorvastatin (LIPITOR) 20 MG tablet Take 1 tablet (20 mg total) by mouth daily. 30 tablet 1  . carvedilol (COREG) 3.125 MG tablet Take 2 tablets (6.25 mg total) by mouth 2 (two) times daily with a meal. 60 tablet 1  . cephALEXin (KEFLEX) 500 MG capsule Take 500 mg by mouth daily.     . colchicine 0.6 MG tablet Take 0.6 mg by mouth daily.    Marland Kitchen FLUoxetine (PROZAC) 20 MG capsule Take 1 capsule (20 mg total) by mouth daily. 30 capsule 1  . fluticasone (FLOVENT HFA) 110 MCG/ACT inhaler Inhale 2 puffs into the lungs 2 (two) times daily.    . folic acid (FOLVITE) 1 MG tablet Take 1 tablet (1 mg total) by mouth daily. 30 tablet 1  . furosemide (LASIX) 40 MG tablet Take 1 tablet (40 mg total) by mouth daily. Pt take 120 mg in AM and 80 mg in PM 30 tablet 11  . furosemide (LASIX) 80 MG tablet Take 1 tablet (80 mg total) by mouth as directed. take 120 mg in AM and 80 mg in afternoon 60 tablet 11  . gabapentin (NEURONTIN) 300 MG capsule Take 1 capsule (300 mg total) by mouth 2 (two) times daily. 60 capsule 1  . levothyroxine (SYNTHROID, LEVOTHROID) 125 MCG tablet Take 1.5 tablets (187 mcg total) by mouth daily before breakfast. 30 tablet 3  . magnesium oxide (MAG-OX) 400 (241.3 MG) MG tablet Take 1 tablet (400 mg total) by mouth 2 (two) times daily. 60 tablet 11  .  neomycin-bacitracin-polymyxin (NEOSPORIN) OINT Apply 1 application topically 2 (two) times daily. To left 3rd toe 14 g 0  . omeprazole (PRILOSEC) 20 MG capsule Take 1 capsule (20 mg total) by mouth daily. 30 capsule 1  . Oxycodone HCl 10 MG TABS Take 10 mg by mouth 3 (three) times daily as needed (pain).    . potassium chloride SA (KLOR-CON M20) 20 MEQ tablet Take 2 tablets (40 mEq total) by mouth as directed. 40 meq in the AM, 20 meq in the PM    . tamoxifen (NOLVADEX) 20 MG tablet Take 1 tablet (20 mg total) by mouth daily. (Patient taking differently: Take 20 mg by mouth at bedtime. ) 90 tablet 3  . venlafaxine XR (EFFEXOR-XR) 37.5 MG 24 hr capsule TAKE ONE CAPSULE BY MOUTH DAILY 30 capsule 6   No current facility-administered medications for this visit.    Allergies:   Erythromycin    Social History:  The patient  reports that he quit smoking about 46 years ago. He has never used smokeless tobacco. He reports that he drinks alcohol. He reports that he uses illicit drugs (Marijuana).   Family History:  The patient's family history includes CVA in his father and mother; Cancer in his mother; Stomach cancer (age of onset: 35) in his brother. There is no history of Heart attack.    ROS:   Please see the history of present illness.   Review of Systems  Cardiovascular: Positive for dyspnea on exertion, irregular heartbeat and leg swelling.  Respiratory: Positive for shortness of breath.   Musculoskeletal: Positive for back pain, joint pain, joint swelling and myalgias.  Neurological: Positive for loss of balance.  All other systems reviewed and are negative.     PHYSICAL EXAM: VS:  BP 128/58 mmHg  Pulse 66  Ht 5\' 8"  (1.727 m)  Wt 242 lb (109.77 kg)  BMI 36.80 kg/m2  SpO2 91%    Wt Readings from Last 3 Encounters:  07/22/15 242 lb (109.77 kg)  07/15/15 244 lb 6.4 oz (110.859 kg)  07/07/15 246 lb 14.6 oz (112 kg)     GEN: chronically ill appearing male arriving in a wheelchair,  in no acute distress HEENT: normal Neck: no JVD at 90, no masses Cardiac:  Normal S1/S2, irregularly irregular rhythm; no murmur ,  no rubs or gallops, 2+ bilateral brawny LE edema up to the knee (L >R) Respiratory:  Decreased breath sounds bilaterally with bibasilar crackles, no wheezing, rhonchi   GI: soft, nontender, nondistended, + BS MS: no deformity or atrophy Skin: bilateral LE erythema from ankle to just below the knee (L > R)  Neuro:  CNs II-XII intact, Strength and sensation are intact Psych: Normal affect   EKG:  EKG is not ordered today.  It demonstrates:   n/a   Recent Labs: 10/03/2014: TSH 1.941 07/01/2015: B Natriuretic Peptide 331.3* 07/02/2015: ALT 7*; Magnesium 1.4* 07/05/2015: Hemoglobin 10.8*; Platelets 110* 07/15/2015: BUN 51*; Creat 1.73*; Potassium 4.7; Sodium 139    Lipid Panel    Component Value Date/Time   CHOL 100 08/02/2014 0524   TRIG 88 08/02/2014 0524   HDL 34* 08/02/2014 0524   CHOLHDL 2.9 08/02/2014 0524   VLDL 18 08/02/2014 0524   LDLCALC 48 08/02/2014 0524      ASSESSMENT AND PLAN:  1. Chronic Diastolic CHF:  Volume appears to be stable.  Recent lab work demonstrated worsening Creatinine.  Previous diuretic dose was Lasix 160 bid and Metolazone 1 x week.  For now, I would continue Lasix 120 mg QAM and 80 mg QPM.  Recheck BMET today.  FU with HF Clinic as planned.   2. Cellulitis:  His cellulitis appears to be resolved.  However, his PCP recently extended his antibiotics. I believe his LE edema is multifactorial and related to venous insufficiency and CHF.  Given the recent increase in his Creatinine, I believe he is stable from a volume standpoint and his LE edema is currently more related to venous insufficiency.  He sees the wound clinic in 2 days. Hopefully, he can get Unna boots placed to help with his edema.  I have given him info on the Lounge Dr pillow to keep his legs elevated.   2. CAD s/p CABG:  No angina. Continue aspirin, statin,  beta blocker.  3. Atrial Fibrillation:  Chronic. Rate controlled. He has been deemed a poor candidate for anticoagulation in the past because of alcohol abuse. We can consider resuming anticoagulation in the future if he remains off of alcohol.  4. Carotid Stenosis:  Stable bilateral disease by ultrasound 6/16. Follow-up should be planned in 03/2016.  5. ETOH Abuse:  He denies alcohol abuse in the recent past.  He has a DUI with an upcoming court date.  He is not able to get into the court house in his current state.  I have given him a note to help reschedule his court date until a later time.   6. HTN:   Controlled.   7. Chronic Kidney Disease:  Repeat BMET today.   8. Hyperlipidemia:  Continue statin.  9. Breast cancer: Status post mastectomy. He has been treated with tamoxifen. He is followed by oncology.  10. S/p SB resection:  Follow up with Gen Surgery as planned.  Medication Changes: Current medicines are reviewed at length with the patient today.  Concerns regarding medicines are as outlined above.  The following changes have been made:   Discontinued Medications   DOXYCYCLINE (VIBRA-TABS) 100 MG TABLET    Take 1 tablet (100 mg total) by mouth every 12 (twelve) hours. For 6 more days   LEVOFLOXACIN (LEVAQUIN) 500 MG TABLET    Take 1 tablet (500 mg total) by mouth daily. For 6 more days.   Modified Medications   No medications on file   New Prescriptions   No medications on file   Labs/ tests ordered today include:   Orders Placed This Encounter  Procedures  . Basic Metabolic Panel (BMET)      Disposition:    FU Dr. Glori Bickers in Nov and Dr. Lauree Chandler 3 mos.        Signed, Versie Starks, MHS 07/22/2015 2:16 PM    [facility] [address], [place], [place]  [address] Phone: [phone]; Fax: [phone]

## 2015-07-22 ENCOUNTER — Ambulatory Visit (INDEPENDENT_AMBULATORY_CARE_PROVIDER_SITE_OTHER): Payer: Medicare Other | Admitting: Physician Assistant

## 2015-07-22 ENCOUNTER — Other Ambulatory Visit: Payer: Medicare Other

## 2015-07-22 ENCOUNTER — Encounter: Payer: Self-pay | Admitting: Physician Assistant

## 2015-07-22 VITALS — BP 128/58 | HR 66 | Ht 68.0 in | Wt 242.0 lb

## 2015-07-22 DIAGNOSIS — I6523 Occlusion and stenosis of bilateral carotid arteries: Secondary | ICD-10-CM

## 2015-07-22 DIAGNOSIS — F1010 Alcohol abuse, uncomplicated: Secondary | ICD-10-CM

## 2015-07-22 DIAGNOSIS — N183 Chronic kidney disease, stage 3 (moderate): Secondary | ICD-10-CM

## 2015-07-22 DIAGNOSIS — I10 Essential (primary) hypertension: Secondary | ICD-10-CM

## 2015-07-22 DIAGNOSIS — I2510 Atherosclerotic heart disease of native coronary artery without angina pectoris: Secondary | ICD-10-CM | POA: Diagnosis not present

## 2015-07-22 DIAGNOSIS — I5032 Chronic diastolic (congestive) heart failure: Secondary | ICD-10-CM | POA: Diagnosis not present

## 2015-07-22 DIAGNOSIS — I482 Chronic atrial fibrillation, unspecified: Secondary | ICD-10-CM

## 2015-07-22 DIAGNOSIS — E785 Hyperlipidemia, unspecified: Secondary | ICD-10-CM

## 2015-07-22 LAB — BASIC METABOLIC PANEL
BUN: 59 mg/dL — ABNORMAL HIGH (ref 7–25)
CALCIUM: 9 mg/dL (ref 8.6–10.3)
CO2: 34 mmol/L — ABNORMAL HIGH (ref 20–31)
CREATININE: 1.34 mg/dL — AB (ref 0.70–1.18)
Chloride: 90 mmol/L — ABNORMAL LOW (ref 98–110)
Glucose, Bld: 222 mg/dL — ABNORMAL HIGH (ref 65–99)
Potassium: 4.2 mmol/L (ref 3.5–5.3)
Sodium: 134 mmol/L — ABNORMAL LOW (ref 135–146)

## 2015-07-22 NOTE — Patient Instructions (Addendum)
Medication Instructions:  Your physician recommends that you continue on your current medications as directed. Please refer to the Current Medication list given to you today.  Labwork: TODAY BMET  Testing/Procedures: NONE  Follow-Up: 10/22/15 @ 1:45 WITH DR. Angelena Form  Any Other Special Instructions Will Be Listed Below (If Applicable).  For your  leg edema you  should do  the following 1. Leg elevation - I recommend the Lounge Dr. Leg rest.  See below for details    Go to [email or web address]

## 2015-07-23 ENCOUNTER — Telehealth: Payer: Self-pay | Admitting: *Deleted

## 2015-07-23 NOTE — Telephone Encounter (Signed)
DPR [name] notified of lab results. Advised pt to hold PM lasix today. Pt already took PM dose. Advised hold AM dose of lasix 10/13, take 80 mg in PM, decrease K+ 20 meq BID, on 10/14 change lasix to 80 mg BID. BMET to be done w/[facility] [place]. [facility] name is Ok Edwards. I will call Arville Go and arrange for lab work next week. I s/w [facility] tonight who states she will be happy to obtain BMET 10/20 and fax results to [place].

## 2015-07-24 ENCOUNTER — Encounter (HOSPITAL_BASED_OUTPATIENT_CLINIC_OR_DEPARTMENT_OTHER): Payer: Medicare Other

## 2015-07-24 ENCOUNTER — Telehealth: Payer: Self-pay | Admitting: Physician Assistant

## 2015-07-24 NOTE — Telephone Encounter (Signed)
I cb and s/w [name] pt's daughter about visit today at wound clinic. She said they were told they cannot do anything for pt since he does not have an OPEN wound. [name] states they were advised to call [place] since he referred pt to wound clinic. Wound clinic suggested to see if cardiology can put an order into [place] for [employer]. [name] states to me that they were told that they did not feel pt had cellulitis and only had inflammation from edema. I advised I will d/w Brynda Rim. PA Friday 10/14 to see what we can do and that I will cb later tomorrow. [name] was agreeable to plan of care and said thank you for all we do.

## 2015-07-24 NOTE — Telephone Encounter (Signed)
[employer]  Pt dtr calling to speak w/ Arbie Cookey- returning phone call. Please call back and discuss.

## 2015-07-25 ENCOUNTER — Other Ambulatory Visit: Payer: Self-pay | Admitting: Physician Assistant

## 2015-07-25 NOTE — Telephone Encounter (Signed)
S/w Ok Edwards [facility] at [place] for pt in reference to order for [employer]. She asked for order to be faxed over [phone]. Ok Edwards states they can get this started on Monday, [facility] states will do tx twice a week, unless provider orders different.

## 2015-07-25 NOTE — Telephone Encounter (Signed)
I agree that his redness is from edema and his cellulitis is resolved. Please put in order for [employer] so he can get Unna boots placed. Richardson Dopp, PA-C   07/25/2015 9:54 AM

## 2015-07-27 NOTE — Assessment & Plan Note (Signed)
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 T1 C. N0 M0 stage IA. This was diagnosed August 2015. 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 Multiple comorbidities including CKD stage III, CHF, COPD, chronic venous stasis, recent CVA Recent Hosp: For sepsis  Return to clinic in 6 months for follow-up

## 2015-07-28 ENCOUNTER — Ambulatory Visit (HOSPITAL_BASED_OUTPATIENT_CLINIC_OR_DEPARTMENT_OTHER): Payer: Medicare Other | Admitting: Hematology and Oncology

## 2015-07-28 ENCOUNTER — Telehealth: Payer: Self-pay | Admitting: Hematology and Oncology

## 2015-07-28 ENCOUNTER — Encounter: Payer: Self-pay | Admitting: Hematology and Oncology

## 2015-07-28 VITALS — BP 141/86 | HR 71 | Temp 97.5°F | Resp 19 | Ht 68.0 in | Wt 248.2 lb

## 2015-07-28 DIAGNOSIS — C50421 Malignant neoplasm of upper-outer quadrant of right male breast: Secondary | ICD-10-CM

## 2015-07-28 DIAGNOSIS — R232 Flushing: Secondary | ICD-10-CM

## 2015-07-28 DIAGNOSIS — Z170 Estrogen receptor positive status [ER+]: Secondary | ICD-10-CM

## 2015-07-28 DIAGNOSIS — Z79811 Long term (current) use of aromatase inhibitors: Secondary | ICD-10-CM

## 2015-07-28 NOTE — Progress Notes (Signed)
Patient Care Team: Jene Every, MD as PCP - General (Family Medicine)  DIAGNOSIS: No matching staging information was found for the patient.  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 He was on neoadjuvant tamoxifen therapy November 2015. He is tolerating tamoxifen extremely well. He used to have lots of hot flashes but these have improved significantly. Recently had abdominal surgery and is healing very well. He has chronic leg edema for which she is receiving leg wrapping which has been helping him. He was in the hospital for cellulitis and sepsis.  REVIEW OF SYSTEMS:   Constitutional: Denies fevers, chills or abnormal weight loss Eyes: Denies blurriness of vision Ears, nose, mouth, throat, and face: Denies mucositis or sore throat Respiratory: Denies cough, dyspnea or wheezes Cardiovascular: Denies palpitation, chest discomfort; 3+ lower extremity swelling Gastrointestinal:  Denies nausea, heartburn or change in bowel habits Skin: Denies abnormal skin rashes Lymphatics: Denies new lymphadenopathy or easy bruising Neurological:Denies numbness,  tingling or new weaknesses Behavioral/Psych: Mood is stable, no new changes  Breast:  denies any pain or lumps or nodules in either breasts All other systems were reviewed with the patient and are negative.  I have reviewed the past medical history, past surgical history, social history and family history with the patient and they are unchanged from previous note.  ALLERGIES:  is allergic to erythromycin.  MEDICATIONS:  Current Outpatient Prescriptions  Medication Sig Dispense Refill  . albuterol-ipratropium (COMBIVENT) 18-103 MCG/ACT inhaler Inhale 2 puffs into the lungs 2 (two) times daily. 1 Inhaler 1  . allopurinol (ZYLOPRIM) 300 MG tablet Take 1 tablet (300 mg total) by mouth daily. 30 tablet 3  . aspirin EC 325 MG EC tablet Take 1 tablet (325 mg total) by mouth daily. 30 tablet 0  . atorvastatin (LIPITOR) 20 MG tablet Take 1 tablet (20 mg total) by mouth daily. 30 tablet 1  . carvedilol (COREG) 3.125 MG tablet Take 2 tablets (6.25 mg total) by mouth 2 (two) times daily with a meal. 60 tablet 1  . cephALEXin (KEFLEX) 500 MG capsule Take 500 mg by mouth daily.     . colchicine 0.6 MG tablet Take 0.6 mg by mouth daily.    Marland Kitchen FLUoxetine (PROZAC) 20 MG capsule Take 1 capsule (20 mg total) by mouth daily. 30 capsule 1  . fluticasone (FLOVENT HFA) 110 MCG/ACT inhaler Inhale 2 puffs into the lungs 2 (two) times daily.    . folic acid (FOLVITE) 1 MG tablet Take 1 tablet (1 mg total) by mouth daily. 30 tablet 1  . furosemide (LASIX) 40 MG tablet Take 1 tablet (40 mg total) by mouth daily. Pt take 120 mg in AM and 80 mg in PM 30  tablet 11  . furosemide (LASIX) 80 MG tablet Take 1 tablet (80 mg total) by mouth as directed. take 120 mg in AM and 80 mg in afternoon 60 tablet 11  . gabapentin (NEURONTIN) 300 MG capsule Take 1 capsule (300 mg total) by mouth 2 (two) times daily. 60 capsule 1  . levothyroxine (SYNTHROID, LEVOTHROID) 125 MCG tablet Take 1.5 tablets (187 mcg total) by mouth daily before  breakfast. 30 tablet 3  . magnesium oxide (MAG-OX) 400 (241.3 MG) MG tablet Take 1 tablet (400 mg total) by mouth 2 (two) times daily. 60 tablet 11  . neomycin-bacitracin-polymyxin (NEOSPORIN) OINT Apply 1 application topically 2 (two) times daily. To left 3rd toe 14 g 0  . omeprazole (PRILOSEC) 20 MG capsule Take 1 capsule (20 mg total) by mouth daily. 30 capsule 1  . Oxycodone HCl 10 MG TABS Take 10 mg by mouth 3 (three) times daily as needed (pain).    . potassium chloride SA (KLOR-CON M20) 20 MEQ tablet Take 2 tablets (40 mEq total) by mouth as directed. 40 meq in the AM, 20 meq in the PM    . tamoxifen (NOLVADEX) 20 MG tablet Take 1 tablet (20 mg total) by mouth daily. (Patient taking differently: Take 20 mg by mouth at bedtime. ) 90 tablet 3  . venlafaxine XR (EFFEXOR-XR) 37.5 MG 24 hr capsule TAKE ONE CAPSULE BY MOUTH DAILY 30 capsule 6   No current facility-administered medications for this visit.    PHYSICAL EXAMINATION: ECOG PERFORMANCE STATUS: 1 - Symptomatic but completely ambulatory  Filed Vitals:   07/28/15 1410  BP: 141/86  Pulse: 71  Temp: 97.5 F (36.4 C)  Resp: 19   Filed Weights   07/28/15 1410  Weight: 248 lb 3.2 oz (112.583 kg)    GENERAL:alert, no distress and comfortable SKIN: skin color, texture, turgor are normal, no rashes or significant lesions EYES: normal, Conjunctiva are pink and non-injected, sclera clear OROPHARYNX:no exudate, no erythema and lips, buccal mucosa, and tongue normal  NECK: supple, thyroid normal size, non-tender, without nodularity LYMPH:  no palpable lymphadenopathy in the cervical, axillary or inguinal LUNGS: Bilateral coarse breath sounds and crackles HEART: regular rate & rhythm and no murmurs 3+ lower extremity edema ABDOMEN: Recent abdominal surgery Musculoskeletal:no cyanosis of digits and no clubbing  NEURO: alert & oriented x 3 with fluent speech, no focal motor/sensory deficits BREAST: No palpable lumps or nodules in  bilateral breasts are axilla.  LABORATORY DATA:  I have reviewed the data as listed   Chemistry      Component Value Date/Time   NA 134* 07/22/2015 1411   NA 138 10/22/2014 1407   K 4.2 07/22/2015 1411   K 3.9 10/22/2014 1407   CL 90* 07/22/2015 1411   CO2 34* 07/22/2015 1411   CO2 30* 10/22/2014 1407   BUN 59* 07/22/2015 1411   BUN 55.3* 10/22/2014 1407   CREATININE 1.34* 07/22/2015 1411   CREATININE 0.98 07/07/2015 0249   CREATININE 1.4* 10/22/2014 1407      Component Value Date/Time   CALCIUM 9.0 07/22/2015 1411   CALCIUM 8.6 10/22/2014 1407   ALKPHOS 49 07/02/2015 0240   ALKPHOS 68 10/22/2014 1407   AST 12* 07/02/2015 0240   AST 16 10/22/2014 1407   ALT 7* 07/02/2015 0240   ALT 6 10/22/2014 1407   BILITOT 0.9 07/02/2015 0240   BILITOT 0.46 10/22/2014 1407       Lab Results  Component Value Date   WBC 7.3 07/05/2015  HGB 10.8* 07/05/2015   HCT 35.6* 07/05/2015   MCV 102.0* 07/05/2015   PLT 110* 07/05/2015   NEUTROABS 11.1* 07/01/2015   ASSESSMENT & PLAN:  History of male breast cancer, s/p R mastectomy Jan 2016 Right breast mastectomy 11/11/2014: Invasive ductal carcinoma grade 3, 2 cm, DCIS high-grade, margins negative, ER 100%, PR 77%, HER-2 negative ratio 1.45, Ki-67 51% T1 cN0 M0 stage IA (Right breast invasive ductal carcinoma ER/PR positive HER-2 negative, tumor size 1.9 cm this and the CT scan dated 08/30/2014. T1 C. N0 M0 stage IA. This was diagnosed August 2015. 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 Multiple comorbidities including CKD stage III, CHF, COPD, chronic venous stasis, CVA, hospitalization for sepsis, wound infections,  abdominal surgery Recent Hosp: For sepsis  Return to clinic in 6 months for follow-up  No orders of the defined types were placed in this encounter.   The patient has a good understanding of the overall plan. he agrees with it. he will call with any problems that may develop before the next visit here.   Rulon Eisenmenger, MD 07/28/2015

## 2015-07-28 NOTE — Telephone Encounter (Signed)
Gave patient avs report and appointments for April 2017.  °

## 2015-07-28 NOTE — Addendum Note (Signed)
Addended by: Prentiss Bells on: 07/28/2015 05:09 PM   Modules accepted: Medications

## 2015-08-01 ENCOUNTER — Encounter: Payer: Self-pay | Admitting: Cardiovascular Disease

## 2015-08-01 ENCOUNTER — Telehealth: Payer: Self-pay | Admitting: *Deleted

## 2015-08-01 NOTE — Telephone Encounter (Signed)
Richardson Dopp, PA-C, received lab results from [facility], s/w daughter per DPR to let know pt's creatinine has improved and to continue current medical therapy.

## 2015-08-11 ENCOUNTER — Inpatient Hospital Stay (HOSPITAL_COMMUNITY): Payer: Medicare Other

## 2015-08-11 ENCOUNTER — Emergency Department (HOSPITAL_COMMUNITY): Payer: Medicare Other

## 2015-08-11 ENCOUNTER — Encounter (HOSPITAL_COMMUNITY): Payer: Self-pay | Admitting: *Deleted

## 2015-08-11 ENCOUNTER — Telehealth: Payer: Self-pay | Admitting: Cardiovascular Disease

## 2015-08-11 ENCOUNTER — Inpatient Hospital Stay (HOSPITAL_COMMUNITY)
Admission: EM | Admit: 2015-08-11 | Discharge: 2015-08-22 | DRG: 286 | Disposition: A | Discharge status: Skilled Nursing Facility | Payer: Medicare Other | Attending: Internal Medicine | Admitting: Internal Medicine

## 2015-08-11 DIAGNOSIS — N179 Acute kidney failure, unspecified: Secondary | ICD-10-CM | POA: Diagnosis not present

## 2015-08-11 DIAGNOSIS — Z8673 Personal history of transient ischemic attack (TIA), and cerebral infarction without residual deficits: Secondary | ICD-10-CM

## 2015-08-11 DIAGNOSIS — I6350 Cerebral infarction due to unspecified occlusion or stenosis of unspecified cerebral artery: Secondary | ICD-10-CM | POA: Diagnosis not present

## 2015-08-11 DIAGNOSIS — N183 Chronic kidney disease, stage 3 (moderate): Secondary | ICD-10-CM | POA: Diagnosis present

## 2015-08-11 DIAGNOSIS — Z79810 Long term (current) use of selective estrogen receptor modulators (SERMs): Secondary | ICD-10-CM | POA: Diagnosis not present

## 2015-08-11 DIAGNOSIS — D649 Anemia, unspecified: Secondary | ICD-10-CM | POA: Diagnosis present

## 2015-08-11 DIAGNOSIS — E785 Hyperlipidemia, unspecified: Secondary | ICD-10-CM | POA: Diagnosis present

## 2015-08-11 DIAGNOSIS — I481 Persistent atrial fibrillation: Secondary | ICD-10-CM | POA: Diagnosis present

## 2015-08-11 DIAGNOSIS — I5033 Acute on chronic diastolic (congestive) heart failure: Secondary | ICD-10-CM | POA: Diagnosis present

## 2015-08-11 DIAGNOSIS — F329 Major depressive disorder, single episode, unspecified: Secondary | ICD-10-CM | POA: Diagnosis present

## 2015-08-11 DIAGNOSIS — F0390 Unspecified dementia without behavioral disturbance: Secondary | ICD-10-CM | POA: Diagnosis present

## 2015-08-11 DIAGNOSIS — R55 Syncope and collapse: Secondary | ICD-10-CM | POA: Diagnosis not present

## 2015-08-11 DIAGNOSIS — F1020 Alcohol dependence, uncomplicated: Secondary | ICD-10-CM | POA: Diagnosis present

## 2015-08-11 DIAGNOSIS — E1121 Type 2 diabetes mellitus with diabetic nephropathy: Secondary | ICD-10-CM | POA: Diagnosis present

## 2015-08-11 DIAGNOSIS — E039 Hypothyroidism, unspecified: Secondary | ICD-10-CM | POA: Diagnosis present

## 2015-08-11 DIAGNOSIS — R0902 Hypoxemia: Secondary | ICD-10-CM | POA: Diagnosis not present

## 2015-08-11 DIAGNOSIS — Z79899 Other long term (current) drug therapy: Secondary | ICD-10-CM | POA: Diagnosis not present

## 2015-08-11 DIAGNOSIS — Z9181 History of falling: Secondary | ICD-10-CM | POA: Diagnosis not present

## 2015-08-11 DIAGNOSIS — Z6838 Body mass index (BMI) 38.0-38.9, adult: Secondary | ICD-10-CM | POA: Diagnosis not present

## 2015-08-11 DIAGNOSIS — K632 Fistula of intestine: Secondary | ICD-10-CM | POA: Diagnosis present

## 2015-08-11 DIAGNOSIS — R627 Adult failure to thrive: Secondary | ICD-10-CM | POA: Diagnosis present

## 2015-08-11 DIAGNOSIS — E119 Type 2 diabetes mellitus without complications: Secondary | ICD-10-CM

## 2015-08-11 DIAGNOSIS — J449 Chronic obstructive pulmonary disease, unspecified: Secondary | ICD-10-CM | POA: Diagnosis present

## 2015-08-11 DIAGNOSIS — G9340 Encephalopathy, unspecified: Secondary | ICD-10-CM | POA: Diagnosis present

## 2015-08-11 DIAGNOSIS — R112 Nausea with vomiting, unspecified: Secondary | ICD-10-CM

## 2015-08-11 DIAGNOSIS — J439 Emphysema, unspecified: Secondary | ICD-10-CM | POA: Diagnosis not present

## 2015-08-11 DIAGNOSIS — I2510 Atherosclerotic heart disease of native coronary artery without angina pectoris: Secondary | ICD-10-CM | POA: Diagnosis present

## 2015-08-11 DIAGNOSIS — C50929 Malignant neoplasm of unspecified site of unspecified male breast: Secondary | ICD-10-CM | POA: Diagnosis present

## 2015-08-11 DIAGNOSIS — I482 Chronic atrial fibrillation: Secondary | ICD-10-CM | POA: Diagnosis present

## 2015-08-11 DIAGNOSIS — J9601 Acute respiratory failure with hypoxia: Secondary | ICD-10-CM | POA: Diagnosis present

## 2015-08-11 DIAGNOSIS — R1011 Right upper quadrant pain: Secondary | ICD-10-CM

## 2015-08-11 DIAGNOSIS — I4891 Unspecified atrial fibrillation: Secondary | ICD-10-CM | POA: Diagnosis present

## 2015-08-11 DIAGNOSIS — Z8249 Family history of ischemic heart disease and other diseases of the circulatory system: Secondary | ICD-10-CM | POA: Diagnosis not present

## 2015-08-11 DIAGNOSIS — N170 Acute kidney failure with tubular necrosis: Secondary | ICD-10-CM | POA: Diagnosis not present

## 2015-08-11 DIAGNOSIS — I272 Other secondary pulmonary hypertension: Secondary | ICD-10-CM | POA: Diagnosis present

## 2015-08-11 DIAGNOSIS — Z9981 Dependence on supplemental oxygen: Secondary | ICD-10-CM

## 2015-08-11 DIAGNOSIS — G4733 Obstructive sleep apnea (adult) (pediatric): Secondary | ICD-10-CM | POA: Diagnosis present

## 2015-08-11 DIAGNOSIS — R5381 Other malaise: Secondary | ICD-10-CM | POA: Diagnosis present

## 2015-08-11 DIAGNOSIS — J690 Pneumonitis due to inhalation of food and vomit: Secondary | ICD-10-CM | POA: Diagnosis present

## 2015-08-11 DIAGNOSIS — I248 Other forms of acute ischemic heart disease: Secondary | ICD-10-CM | POA: Diagnosis present

## 2015-08-11 DIAGNOSIS — R0602 Shortness of breath: Secondary | ICD-10-CM | POA: Diagnosis present

## 2015-08-11 DIAGNOSIS — Z951 Presence of aortocoronary bypass graft: Secondary | ICD-10-CM | POA: Diagnosis not present

## 2015-08-11 DIAGNOSIS — I63031 Cerebral infarction due to thrombosis of right carotid artery: Secondary | ICD-10-CM | POA: Diagnosis not present

## 2015-08-11 DIAGNOSIS — N171 Acute kidney failure with acute cortical necrosis: Secondary | ICD-10-CM | POA: Diagnosis not present

## 2015-08-11 DIAGNOSIS — D696 Thrombocytopenia, unspecified: Secondary | ICD-10-CM | POA: Diagnosis present

## 2015-08-11 DIAGNOSIS — M109 Gout, unspecified: Secondary | ICD-10-CM | POA: Diagnosis present

## 2015-08-11 DIAGNOSIS — I270 Primary pulmonary hypertension: Secondary | ICD-10-CM | POA: Diagnosis not present

## 2015-08-11 DIAGNOSIS — Z87891 Personal history of nicotine dependence: Secondary | ICD-10-CM | POA: Diagnosis not present

## 2015-08-11 DIAGNOSIS — I1310 Hypertensive heart and chronic kidney disease without heart failure, with stage 1 through stage 4 chronic kidney disease, or unspecified chronic kidney disease: Secondary | ICD-10-CM | POA: Diagnosis present

## 2015-08-11 DIAGNOSIS — E1122 Type 2 diabetes mellitus with diabetic chronic kidney disease: Secondary | ICD-10-CM | POA: Diagnosis present

## 2015-08-11 DIAGNOSIS — I480 Paroxysmal atrial fibrillation: Secondary | ICD-10-CM | POA: Diagnosis present

## 2015-08-11 DIAGNOSIS — K219 Gastro-esophageal reflux disease without esophagitis: Secondary | ICD-10-CM | POA: Diagnosis present

## 2015-08-11 DIAGNOSIS — I639 Cerebral infarction, unspecified: Secondary | ICD-10-CM | POA: Diagnosis present

## 2015-08-11 DIAGNOSIS — I63419 Cerebral infarction due to embolism of unspecified middle cerebral artery: Secondary | ICD-10-CM | POA: Diagnosis not present

## 2015-08-11 DIAGNOSIS — I4819 Other persistent atrial fibrillation: Secondary | ICD-10-CM

## 2015-08-11 DIAGNOSIS — Z7982 Long term (current) use of aspirin: Secondary | ICD-10-CM | POA: Diagnosis not present

## 2015-08-11 DIAGNOSIS — N178 Other acute kidney failure: Secondary | ICD-10-CM | POA: Diagnosis not present

## 2015-08-11 LAB — URINALYSIS, ROUTINE W REFLEX MICROSCOPIC
Bilirubin Urine: NEGATIVE
GLUCOSE, UA: NEGATIVE mg/dL
Hgb urine dipstick: NEGATIVE
KETONES UR: NEGATIVE mg/dL
LEUKOCYTES UA: NEGATIVE
Nitrite: NEGATIVE
PH: 5.5 (ref 5.0–8.0)
Protein, ur: 100 mg/dL — AB
Specific Gravity, Urine: 1.012 (ref 1.005–1.030)
Urobilinogen, UA: 1 mg/dL (ref 0.0–1.0)

## 2015-08-11 LAB — URINE MICROSCOPIC-ADD ON

## 2015-08-11 LAB — BASIC METABOLIC PANEL
Anion gap: 11 (ref 5–15)
BUN: 41 mg/dL — ABNORMAL HIGH (ref 6–20)
CALCIUM: 9 mg/dL (ref 8.9–10.3)
CHLORIDE: 98 mmol/L — AB (ref 101–111)
CO2: 27 mmol/L (ref 22–32)
CREATININE: 1.56 mg/dL — AB (ref 0.61–1.24)
GFR, EST AFRICAN AMERICAN: 50 mL/min — AB (ref >60)
GFR, EST NON AFRICAN AMERICAN: 43 mL/min — AB (ref >60)
Glucose, Bld: 221 mg/dL — ABNORMAL HIGH (ref 65–99)
Potassium: 4.8 mmol/L (ref 3.5–5.1)
SODIUM: 136 mmol/L (ref 135–145)

## 2015-08-11 LAB — I-STAT TROPONIN, ED: TROPONIN I, POC: 0.02 ng/mL (ref 0.00–0.08)

## 2015-08-11 LAB — CBC
HEMATOCRIT: 37.1 % — AB (ref 39.0–52.0)
HEMOGLOBIN: 10.9 g/dL — AB (ref 13.0–17.0)
MCH: 29.9 pg (ref 26.0–34.0)
MCHC: 29.4 g/dL — ABNORMAL LOW (ref 30.0–36.0)
MCV: 101.9 fL — ABNORMAL HIGH (ref 78.0–100.0)
Platelets: 93 10*3/uL — ABNORMAL LOW (ref 150–400)
RBC: 3.64 MIL/uL — ABNORMAL LOW (ref 4.22–5.81)
RDW: 16.1 % — AB (ref 11.5–15.5)
WBC: 6.1 10*3/uL (ref 4.0–10.5)

## 2015-08-11 LAB — CBG MONITORING, ED: GLUCOSE-CAPILLARY: 181 mg/dL — AB (ref 65–99)

## 2015-08-11 LAB — BRAIN NATRIURETIC PEPTIDE: B NATRIURETIC PEPTIDE 5: 299.1 pg/mL — AB (ref 0.0–100.0)

## 2015-08-11 MED ORDER — IOHEXOL 350 MG/ML SOLN
80.0000 mL | Freq: Once | INTRAVENOUS | Status: AC | PRN
  Administered 2015-08-11: 80 mL via INTRAVENOUS

## 2015-08-11 MED ORDER — HALOPERIDOL LACTATE 5 MG/ML IJ SOLN
2.0000 mg | Freq: Once | INTRAMUSCULAR | Status: AC
  Administered 2015-08-11: 2 mg via INTRAVENOUS
  Filled 2015-08-11: qty 1

## 2015-08-11 MED ORDER — OLANZAPINE 5 MG PO TABS: 5.0000 mg | ORAL_TABLET | Freq: Once | ORAL | Status: DC

## 2015-08-11 NOTE — Telephone Encounter (Signed)
New Message  [facility] nurse called with updates from order for orthostatic hypotension test. Nurse states that when she was on her way to complete the test she received the call from the patients daughter stating that the pt was in route to the ER.   [facility] Go nurse states that she was told to notify Dr. Angelena Form of the Hypotension and to send in the results of the orthostatic hypotension test.   BP readings  1st reading sitting 92/58 2nd reading sitting  122/80   [facility] nurse states that she will not be able to complete this test until he is released from the hospital.

## 2015-08-11 NOTE — ED Notes (Signed)
Per EMS- pt had worsening SOB and AMS. Pt initial sats at 77% on RA. Pt on NRB now 95%. Hx of CHF.

## 2015-08-11 NOTE — ED Notes (Signed)
Spoke with Daughter Mrs.Selena Batten about pt admission due to pt states he does not want to stay to the admitting Md; Mrs.Hardy states that pt actually passed out today while attempting to get up with walker; Pt has hx stroke; pt currently has some AMS and confused about where is; Md notified; Dr.Walden at bedside speaking with pt and daughter about status and admission.

## 2015-08-11 NOTE — ED Notes (Signed)
[name] [phone] (daughter)

## 2015-08-11 NOTE — ED Provider Notes (Signed)
Arrival Date & Time: 08/11/15 & 1517 History  [name] is a 69 y.o. male Chief Complaint  Patient presents with  . Shortness of Breath   HPI Patient presents with EMS after having concerning syncopal event at home for patient was eating breakfast had associated nausea and fit of emesis which led to coughing. When EMS arrived patient had heart rates noted to vary between 20 and 95 with associated hypoxia to 75 on room air. He wears 2 L of oxygen nasal cannula at night. Patient endorses no chest pain or shortness of breath at this time no abdominal pain mild nausea.   Patient has a wound in right lower abdomen without pain induration and states there is been no increase in discharge or distressing symptoms associated with. States he was due to a prior fistula.  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  Past Medical History  I reviewed & agree with nursing's documentation on PMHx, PSHx, SHx and FHx. Past Medical History  Diagnosis Date  . CHRONIC OBSTRUCTIVE PULMONARY DISEASE 06/20/2009  . CAROTID STENOSIS 06/20/2009    A. 08/2001 s/p L CEA;  B.   09/14/11 - Carotid U/S - 40-59% bilateral stenosis, left CEA patch angioplasty is patent  . DM 06/20/2009  . CAD 06/20/2009    A.  08/2000 - s/p CABG x 4 - LIMA-LAD, Left Radial-OM, VG-DIAG, VG-RCA;  B. Neg. MV  2010  . HYPERLIPIDEMIA 06/20/2009  . HYPERTENSION 06/20/2009  . Hypothyroidism   . Low back pain   .  Asthma     as child  . Pneumonia   . Persistent atrial fibrillation (HCC)     Not felt to be coumadin candidate 2/2 ETOH use.  Marland Kitchen History of tobacco abuse     remote - quit 1970  . Morbidly obese ([place])   . Chronic diastolic heart failure, NYHA class 3 ([place])     Followed by [facility]F Clinic --> Echo 04/2014: EF 69-62%, Diastolic DysFxn - elevated LVEDP & LAP. Mod Dil LA & RA.  . Falls frequently   . Hx of cardiovascular stress test 05/2014    Lexiscan Myoview (8/15):  No ischemia; EF 63% - Normal Study  . Breast cancer, right breast ([place])   . Gout   . OBSTRUCTIVE SLEEP APNEA 06/20/2009    does not use cpap  . Stroke [facility])     found on MRI in Oct. 2015.   Marland Kitchen Shortness of breath dyspnea     occasional  . Depression     takes Prozac  . GERD (gastroesophageal reflux disease)     takes Prilosec  . Arthritis   . Neuromuscular disorder (HCC)     neuropathy in both legs  . Constipation   . ETOH abuse     no alcohol since Oct. 2015  . 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 CHF (congestive heart failure) ([place])   . Supplemental oxygen dependent     hs  . Bilateral  renal cysts   . Kidney disease     Stage 3   Past Surgical History  Procedure Laterality Date  . Carotid endarterectomy  2002    left  . Coronary artery bypass graft      x 4 - 2001  . Umbilical hernia repair N/A 01/28/2014    Procedure: HERNIA REPAIR UMBILICAL ADULT/INC;  Surgeon: Harl Bowie, MD;  Location: [place];  Service: General;  Laterality: N/A;  . Laparotomy N/A 01/28/2014    Procedure: EXPLORATORY LAPAROTOMY;  Surgeon: Harl Bowie, MD;  Location: [place];  Service: General;  Laterality: N/A;  . Bowel resection N/A 01/28/2014    Procedure: SMALL BOWEL RESECTION;  Surgeon: Harl Bowie, MD;  Location: [place];  Service: General;  Laterality: N/A;  . Hernia repair    . Colonoscopy    . Total mastectomy Right 11/11/2014    Procedure: RIGHT TOTAL MASTECTOMY;   Surgeon: Excell Seltzer, MD;  Location: [place];  Service: General;  Laterality: Right;  . Cataract extraction w/phaco Left 04/08/2015    Procedure: CATARACT EXTRACTION PHACO AND INTRAOCULAR LENS PLACEMENT ([place]);  Surgeon: Birder Robson, MD;  Location: [facility];  Service: Ophthalmology;  Laterality: Left;  [place] 00:36 AP% 23.7 CDE 8.72 fluid pack HCW#[id number] H  . Eye surgery Left     Catarct  . Laparotomy N/A 06/02/2015    Procedure: EXPLORATORY LAPAROTOMY;  Surgeon: Coralie Keens, MD;  Location: [place];  Service: General;  Laterality: N/A;  . Bowel resection N/A 06/02/2015    Procedure: SMALL BOWEL RESECTION;  Surgeon: Coralie Keens, MD;  Location: [place];  Service: General;  Laterality: N/A;   Social History   Social History  . Marital Status: Single    Spouse Name: N/A  . Number of Children: N/A  . Years of Education: N/A   Occupational History  . retired    Social History Main Topics  . Smoking status: Former Smoker -- 1.00 packs/day for 16 years    Quit date: 10/11/1968  . Smokeless tobacco: Never Used  . Alcohol Use: Yes     Comment: last drink was 2 weeks - 8?16  . Drug Use: Yes    Special: Marijuana     Comment: last 1 year ago  . Sexual Activity: No   Other Topics Concern  . None   Social History Narrative   Lives in [place] with dtr, son-in-law   Family History  Problem Relation Age of Onset  . CVA Mother     ?  Marland Kitchen Cancer Mother     unknown type of cancer  . CVA Father     ?  Marland Kitchen Heart attack Neg Hx   . Stomach cancer Brother 14    stomach cancer    Review of Systems   Complete ROS performed by me, all positives & negatives documented as above in HPI. All other ROS negative.  Allergies  Erythromycin  Home Medications   Prior to Admission medications   Medication Sig Start Date End Date Taking? Authorizing Provider  albuterol-ipratropium (COMBIVENT) 18-103 MCG/ACT inhaler Inhale 2 puffs into the lungs 2 (two) times daily. 06/18/15  Yes Ivan Anchors  Love, PA-C  allopurinol (ZYLOPRIM) 300 MG tablet Take 1 tablet (300 mg total) by mouth daily. 08/02/14  Yes Daniel J Angiulli, PA-C  aspirin EC 325 MG EC tablet Take 1 tablet (325 mg total) by mouth daily. 08/02/14  Yes Daniel J Angiulli, PA-C  atorvastatin (LIPITOR) 20 MG tablet Take 1 tablet (  20 mg total) by mouth daily. 08/02/14  Yes Daniel J Angiulli, PA-C  carvedilol (COREG) 3.125 MG tablet Take 2 tablets (6.25 mg total) by mouth 2 (two) times daily with a meal. 07/07/15  Yes Bonnielee Haff, MD  colchicine 0.6 MG tablet Take 0.6 mg by mouth daily. 01/13/15  Yes Historical Provider, MD  FLUoxetine (PROZAC) 20 MG capsule Take 1 capsule (20 mg total) by mouth daily. 06/18/15  Yes Ivan Anchors Love, PA-C  fluticasone (FLOVENT HFA) 110 MCG/ACT inhaler Inhale 2 puffs into the lungs 2 (two) times daily.   Yes Historical Provider, MD  folic acid (FOLVITE) 1 MG tablet Take 1 tablet (1 mg total) by mouth daily. 08/02/14  Yes Daniel J Angiulli, PA-C  furosemide (LASIX) 40 MG tablet Take 1 tablet (40 mg total) by mouth daily. Pt take 120 mg in AM and 80 mg in PM 07/15/15  Yes Scott T Weaver, PA-C  furosemide (LASIX) 80 MG tablet Take 1 tablet (80 mg total) by mouth as directed. take 120 mg in AM and 80 mg in afternoon 07/15/15  Yes Scott T Weaver, PA-C  gabapentin (NEURONTIN) 300 MG capsule Take 1 capsule (300 mg total) by mouth 2 (two) times daily. 08/02/14  Yes Daniel J Angiulli, PA-C  levothyroxine (SYNTHROID, LEVOTHROID) 125 MCG tablet Take 1.5 tablets (187 mcg total) by mouth daily before breakfast. 08/02/14  Yes Daniel J Angiulli, PA-C  magnesium oxide (MAG-OX) 400 (241.3 MG) MG tablet Take 1 tablet (400 mg total) by mouth 2 (two) times daily. 06/26/15  Yes Scott Joylene Draft, PA-C  omeprazole (PRILOSEC) 20 MG capsule Take 1 capsule (20 mg total) by mouth daily. 08/02/14  Yes Daniel J Angiulli, PA-C  Oxycodone HCl 10 MG TABS Take 10 mg by mouth 3 (three) times daily as needed (pain).   Yes Historical Provider, MD   potassium chloride SA (KLOR-CON M20) 20 MEQ tablet Take 2 tablets (40 mEq total) by mouth as directed. 40 meq in the AM, 20 meq in the PM 07/16/15  Yes Scott T Kathlen Mody, PA-C  tamoxifen (NOLVADEX) 20 MG tablet Take 1 tablet (20 mg total) by mouth daily. Patient taking differently: Take 20 mg by mouth at bedtime.  09/09/14  Yes Nicholas Lose, MD  venlafaxine XR (EFFEXOR-XR) 37.5 MG 24 hr capsule TAKE ONE CAPSULE BY MOUTH DAILY 07/14/15  Yes Nicholas Lose, MD  metolazone (ZAROXOLYN) 2.5 MG tablet TAKE 1 TABLET BY MOUTH DAILY 30 MINUTES PRIOR TO LASIX DOSAGE 07/09/15   Historical Provider, MD  neomycin-bacitracin-polymyxin (NEOSPORIN) OINT Apply 1 application topically 2 (two) times daily. To left 3rd toe Patient not taking: Reported on 08/11/2015 07/07/15   Bonnielee Haff, MD    Physical Exam  BP 159/85 mmHg  Pulse 85  Temp(Src) 98.5 F (36.9 C) (Oral)  Resp 20  Wt 255 lb 9.6 oz (115.939 kg)  SpO2 99% Physical Exam  Constitutional: He is oriented to person, place, and time. He appears well-developed.  Non-toxic appearance. He does not appear ill. No distress.  HENT:  Head: Normocephalic and atraumatic.  Right Ear: External ear normal.  Left Ear: External ear normal.  Eyes: Pupils are equal, round, and reactive to light. No scleral icterus.  Neck: Normal range of motion. Neck supple. No tracheal deviation present.  Cardiovascular: Intact distal pulses.   No murmur heard. Pulmonary/Chest: Effort normal and breath sounds normal. No stridor. No respiratory distress. He has no wheezes. He has no rales.  Abdominal: Soft. Bowel sounds are normal. He exhibits no distension.  There is no tenderness. There is no rebound and no guarding.  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   Musculoskeletal: Normal range of motion. He exhibits edema (Bilateral LE wrapping in place with underlying edema.).  Neurological: He is alert and oriented to person, place, and time. He has normal strength and  normal reflexes. No cranial nerve deficit or sensory deficit.  Skin: Skin is warm and dry. No pallor.  Psychiatric: His mood appears anxious. He is agitated.  Nursing note and vitals reviewed.   ED Course  Procedures Labs Review Labs Reviewed  CBC - Abnormal; Notable for the following:    RBC 3.64 (*)    Hemoglobin 10.9 (*)    HCT 37.1 (*)    MCV 101.9 (*)    MCHC 29.4 (*)    RDW 16.1 (*)    Platelets 93 (*)    All other components within normal limits  BRAIN NATRIURETIC PEPTIDE - Abnormal; Notable for the following:    B Natriuretic Peptide 299.1 (*)    All other components within normal limits  BASIC METABOLIC PANEL - Abnormal; Notable for the following:    Chloride 98 (*)    Glucose, Bld 221 (*)    BUN 41 (*)    Creatinine, Ser 1.56 (*)    GFR calc non Af Amer 43 (*)    GFR calc Af Amer 50 (*)    All other components within normal limits  URINALYSIS, ROUTINE W REFLEX MICROSCOPIC (NOT AT [facility]) - Abnormal; Notable for the following:    Protein, ur 100 (*)    All other components within normal limits  URINE MICROSCOPIC-ADD ON - Abnormal; Notable for the following:    Casts HYALINE CASTS (*)    All other components within normal limits  TROPONIN I - Abnormal; Notable for the following:    Troponin I 0.09 (*)    All other components within normal limits  TROPONIN I - Abnormal; Notable for the following:    Troponin I 0.08 (*)    All other components within normal limits  TROPONIN I - Abnormal; Notable for the following:    Troponin I 0.07 (*)    All other components within normal limits  CBC - Abnormal; Notable for the following:    RBC 3.76 (*)    Hemoglobin 11.2 (*)    HCT 37.5 (*)    MCHC 29.9 (*)    RDW 16.1 (*)    Platelets 106 (*)    All other components within normal limits  CREATININE, SERUM - Abnormal; Notable for the following:    Creatinine, Ser 1.36 (*)    GFR calc non Af Amer 51 (*)    GFR calc Af Amer 59 (*)    All other components within normal  limits  GLUCOSE, CAPILLARY - Abnormal; Notable for the following:    Glucose-Capillary 153 (*)    All other components within normal limits  GLUCOSE, CAPILLARY - Abnormal; Notable for the following:    Glucose-Capillary 216 (*)    All other components within normal limits  PROTIME-INR - Abnormal; Notable for the following:    Prothrombin Time 17.5 (*)    All other components within normal limits  GLUCOSE, CAPILLARY - Abnormal; Notable for the following:    Glucose-Capillary 166 (*)    All other components within normal limits  GLUCOSE, CAPILLARY - Abnormal; Notable for the following:    Glucose-Capillary 174 (*)    All other components within normal limits  CBC -  Abnormal; Notable for the following:    RBC 3.29 (*)    Hemoglobin 10.1 (*)    HCT 33.1 (*)    MCV 100.6 (*)    RDW 16.2 (*)    Platelets 92 (*)    All other components within normal limits  BASIC METABOLIC PANEL - Abnormal; Notable for the following:    Chloride 95 (*)    CO2 34 (*)    Glucose, Bld 155 (*)    BUN 39 (*)    Creatinine, Ser 1.67 (*)    GFR calc non Af Amer 40 (*)    GFR calc Af Amer 46 (*)    All other components within normal limits  GLUCOSE, CAPILLARY - Abnormal; Notable for the following:    Glucose-Capillary 158 (*)    All other components within normal limits  GLUCOSE, CAPILLARY - Abnormal; Notable for the following:    Glucose-Capillary 186 (*)    All other components within normal limits  GLUCOSE, CAPILLARY - Abnormal; Notable for the following:    Glucose-Capillary 194 (*)    All other components within normal limits  GLUCOSE, CAPILLARY - Abnormal; Notable for the following:    Glucose-Capillary 202 (*)    All other components within normal limits  CBG MONITORING, ED - Abnormal; Notable for the following:    Glucose-Capillary 181 (*)    All other components within normal limits  I-STAT ARTERIAL BLOOD GAS, ED - Abnormal; Notable for the following:    pCO2 arterial 51.4 (*)    pO2,  Arterial 57.0 (*)    Bicarbonate 32.2 (*)    Acid-Base Excess 6.0 (*)    All other components within normal limits  CULTURE, BLOOD (ROUTINE X 2)  CULTURE, BLOOD (ROUTINE X 2)  MRSA PCR SCREENING  CULTURE, EXPECTORATED SPUTUM-ASSESSMENT  GRAM STAIN  AMMONIA  TSH  HIV ANTIBODY (ROUTINE TESTING)  STREP PNEUMONIAE URINARY ANTIGEN  VITAMIN B1  LEGIONELLA PNEUMOPHILA SEROGP 1 UR AG  HEMOGLOBIN V3Z  BASIC METABOLIC PANEL  LIPID PANEL  I-STAT TROPOININ, ED  POCT CBG (FASTING - GLUCOSE)-MANUAL ENTRY   Laboratory and Imaging results were personally reviewed by myself and used in the medical decision making of this patient's treatment and disposition.  EKG Interpretation  EKG Interpretation  Date/Time:  Monday August 11 2015 21:42:21 EDT Ventricular Rate:  87 PR Interval:    QRS Duration: 90 QT Interval:  384 QTC Calculation: 462 R Axis:   119 Text Interpretation:  Atrial fibrillation Right axis deviation ED PHYSICIAN INTERPRETATION AVAILABLE IN [facility] Confirmed by TEST, Record ([id number]) on 08/12/2015 7:17:03 AM      MDM  Sharion Balloon is a 69 y.o. male with H&P as above. ED clinical course as follows:  Patient arrives with acute respiratory failure with new oxygen requirement above baseline of 2 L [place] at night only. Patient requires 4-5 L [place] to remain above 88-90 %.  ECG shows AFib without RVR. Troponin unremarkable. UA without evidence of UTI.   BNP elevated and likely component to presentation today as is CKD. However GFR will tolerate scan for PE per request from Hospitalist who I consulted for admission of acute respiratory failure. Discussed labs, imaging and decision made that due to CXR with concern for aspiration PNA the patient will be placed upon vanc and zosyn. Patient without hypotension or signs for poor end-organ perfusion, therefore sepsis unlikely at this time. However prior to Abx will obtain [facility] and UC. CTA Pe study shows no evidence of  acute PE.   Patient  fluctuating course consistent with age related "sun-downing" vs encephalopathy as patient AAO upon prior arrival. However had no AMS or neck symptoms therefore likely not encephalitis or meningitis. Cranial nerves remain grossly intact. Baseline strength and sensation in upper and lower extremities symmetrically therefore patient not likely to have IC abnormality however is on no AC for AFib due to drinking Hx which should be addressed inpatient given patient sober for greater than 4 months.  Patient requires no interventions in the ED other than oxygen and reorientation.   Patient appropriate for transportation to admitting service. They have received all interventions and treatments requested while in the ED.   Patient admitted for possible emergent etiology, however to the best of my ability, I assured the patient as stable as possible prior to transport.   Disposition: Admit  Clinical Impression:  1. Syncope, unspecified syncope type   2. Persistent atrial fibrillation ([place])   3. Hypoxia   4. Acute encephalopathy   5. Nausea & vomiting   6. Stroke (cerebrum) [facility])    Patient care discussed with Dr. Mingo Amber, who oversaw their evaluation & treatment & voiced agreement. House Officer: Voncille Lo, MD, Emergency Medicine.  Voncille Lo, MD 08/14/15 1660  Evelina Bucy, MD 08/16/15 [phone]

## 2015-08-11 NOTE — ED Notes (Signed)
Patient transported to CT 

## 2015-08-11 NOTE — Telephone Encounter (Signed)
Returned call to [facility] with [facility].She wanted to let Dr.McAlhany know patient went to [facility] ER this afternoon.He stood up and wife said he blacked out she called EMS.Stated she will go out to house this Thursday and do orthostatic B/P's.

## 2015-08-11 NOTE — ED Notes (Signed)
Resident at the bedside

## 2015-08-11 NOTE — ED Notes (Signed)
Patient transported to MRI 

## 2015-08-11 NOTE — ED Notes (Signed)
Dr. Kakrakandy at bedside. 

## 2015-08-12 ENCOUNTER — Inpatient Hospital Stay (HOSPITAL_COMMUNITY): Payer: Medicare Other

## 2015-08-12 ENCOUNTER — Encounter (HOSPITAL_COMMUNITY): Payer: Medicare Other | Admitting: Internal Medicine

## 2015-08-12 ENCOUNTER — Encounter (HOSPITAL_COMMUNITY): Payer: Self-pay | Admitting: Internal Medicine

## 2015-08-12 DIAGNOSIS — I6350 Cerebral infarction due to unspecified occlusion or stenosis of unspecified cerebral artery: Secondary | ICD-10-CM

## 2015-08-12 DIAGNOSIS — G9340 Encephalopathy, unspecified: Secondary | ICD-10-CM | POA: Diagnosis present

## 2015-08-12 DIAGNOSIS — E039 Hypothyroidism, unspecified: Secondary | ICD-10-CM

## 2015-08-12 DIAGNOSIS — J439 Emphysema, unspecified: Secondary | ICD-10-CM

## 2015-08-12 LAB — I-STAT ARTERIAL BLOOD GAS, ED
Acid-Base Excess: 6 mmol/L — ABNORMAL HIGH (ref 0.0–2.0)
Bicarbonate: 32.2 mEq/L — ABNORMAL HIGH (ref 20.0–24.0)
O2 Saturation: 90 %
PCO2 ART: 51.4 mmHg — AB (ref 35.0–45.0)
PH ART: 7.402 (ref 7.350–7.450)
TCO2: 34 mmol/L (ref 0–100)
pO2, Arterial: 57 mmHg — ABNORMAL LOW (ref 80.0–100.0)

## 2015-08-12 LAB — CBC
HEMATOCRIT: 37.5 % — AB (ref 39.0–52.0)
HEMOGLOBIN: 11.2 g/dL — AB (ref 13.0–17.0)
MCH: 29.8 pg (ref 26.0–34.0)
MCHC: 29.9 g/dL — AB (ref 30.0–36.0)
MCV: 99.7 fL (ref 78.0–100.0)
Platelets: 106 10*3/uL — ABNORMAL LOW (ref 150–400)
RBC: 3.76 MIL/uL — AB (ref 4.22–5.81)
RDW: 16.1 % — ABNORMAL HIGH (ref 11.5–15.5)
WBC: 9.4 10*3/uL (ref 4.0–10.5)

## 2015-08-12 LAB — TROPONIN I
TROPONIN I: 0.09 ng/mL — AB (ref <0.031)
TROPONIN I: 0.08 ng/mL — AB (ref <0.031)
TROPONIN I: 0.07 ng/mL — AB (ref <0.031)

## 2015-08-12 LAB — STREP PNEUMONIAE URINARY ANTIGEN: Strep Pneumo Urinary Antigen: NEGATIVE

## 2015-08-12 LAB — CREATININE, SERUM
CREATININE: 1.36 mg/dL — AB (ref 0.61–1.24)
GFR calc Af Amer: 59 mL/min — ABNORMAL LOW (ref >60)
GFR calc non Af Amer: 51 mL/min — ABNORMAL LOW (ref >60)

## 2015-08-12 LAB — HIV ANTIBODY (ROUTINE TESTING W REFLEX): HIV SCREEN 4TH GENERATION: NONREACTIVE

## 2015-08-12 LAB — MRSA PCR SCREENING: MRSA BY PCR: NEGATIVE

## 2015-08-12 LAB — GLUCOSE, CAPILLARY
GLUCOSE-CAPILLARY: 216 mg/dL — AB (ref 65–99)
Glucose-Capillary: 153 mg/dL — ABNORMAL HIGH (ref 65–99)
Glucose-Capillary: 166 mg/dL — ABNORMAL HIGH (ref 65–99)
Glucose-Capillary: 174 mg/dL — ABNORMAL HIGH (ref 65–99)

## 2015-08-12 LAB — AMMONIA: AMMONIA: 18 umol/L (ref 9–35)

## 2015-08-12 LAB — PROTIME-INR
INR: 1.43 (ref 0.00–1.49)
Prothrombin Time: 17.5 seconds — ABNORMAL HIGH (ref 11.6–15.2)

## 2015-08-12 LAB — TSH: TSH: 0.976 u[IU]/mL (ref 0.350–4.500)

## 2015-08-12 MED ORDER — SENNOSIDES-DOCUSATE SODIUM 8.6-50 MG PO TABS: 1.0000 | ORAL_TABLET | Freq: Every evening | ORAL | Status: DC | PRN

## 2015-08-12 MED ORDER — MAGNESIUM OXIDE 400 (241.3 MG) MG PO TABS
400.0000 mg | ORAL_TABLET | Freq: Two times a day (BID) | ORAL | Status: DC
  Administered 2015-08-12 – 2015-08-22 (×21): 400 mg via ORAL
  Filled 2015-08-12 (×21): qty 1

## 2015-08-12 MED ORDER — TAMOXIFEN CITRATE 10 MG PO TABS
20.0000 mg | ORAL_TABLET | Freq: Every day | ORAL | Status: DC
  Administered 2015-08-12 – 2015-08-22 (×10): 20 mg via ORAL
  Filled 2015-08-12 (×13): qty 2

## 2015-08-12 MED ORDER — POTASSIUM CHLORIDE CRYS ER 20 MEQ PO TBCR
40.0000 meq | EXTENDED_RELEASE_TABLET | Freq: Every day | ORAL | Status: DC
  Administered 2015-08-12 – 2015-08-16 (×5): 40 meq via ORAL
  Filled 2015-08-12 (×5): qty 2

## 2015-08-12 MED ORDER — CARVEDILOL 6.25 MG PO TABS
6.2500 mg | ORAL_TABLET | Freq: Two times a day (BID) | ORAL | Status: DC
  Administered 2015-08-12 – 2015-08-22 (×21): 6.25 mg via ORAL
  Filled 2015-08-12 (×21): qty 1

## 2015-08-12 MED ORDER — ENOXAPARIN SODIUM 40 MG/0.4ML ~~LOC~~ SOLN
40.0000 mg | SUBCUTANEOUS | Status: DC
  Administered 2015-08-12 – 2015-08-22 (×11): 40 mg via SUBCUTANEOUS
  Filled 2015-08-12 (×11): qty 0.4

## 2015-08-12 MED ORDER — SODIUM CHLORIDE 0.9 % IV SOLN
1250.0000 mg | Freq: Once | INTRAVENOUS | Status: AC
  Administered 2015-08-12: 1250 mg via INTRAVENOUS
  Filled 2015-08-12: qty 1250

## 2015-08-12 MED ORDER — VENLAFAXINE HCL ER 37.5 MG PO CP24
37.5000 mg | ORAL_CAPSULE | Freq: Every day | ORAL | Status: DC
  Administered 2015-08-12 – 2015-08-22 (×11): 37.5 mg via ORAL
  Filled 2015-08-12 (×11): qty 1

## 2015-08-12 MED ORDER — PANTOPRAZOLE SODIUM 40 MG PO TBEC
40.0000 mg | DELAYED_RELEASE_TABLET | Freq: Every day | ORAL | Status: DC
  Administered 2015-08-12 – 2015-08-22 (×11): 40 mg via ORAL
  Filled 2015-08-12 (×11): qty 1

## 2015-08-12 MED ORDER — GABAPENTIN 300 MG PO CAPS
300.0000 mg | ORAL_CAPSULE | Freq: Two times a day (BID) | ORAL | Status: DC
  Administered 2015-08-12 – 2015-08-22 (×22): 300 mg via ORAL
  Filled 2015-08-12 (×22): qty 1

## 2015-08-12 MED ORDER — ALLOPURINOL 300 MG PO TABS
300.0000 mg | ORAL_TABLET | Freq: Every day | ORAL | Status: DC
  Administered 2015-08-12 – 2015-08-22 (×11): 300 mg via ORAL
  Filled 2015-08-12 (×11): qty 1

## 2015-08-12 MED ORDER — INSULIN ASPART 100 UNIT/ML ~~LOC~~ SOLN
0.0000 [IU] | Freq: Three times a day (TID) | SUBCUTANEOUS | Status: DC
  Administered 2015-08-12 (×2): 2 [IU] via SUBCUTANEOUS
  Administered 2015-08-12: 3 [IU] via SUBCUTANEOUS
  Administered 2015-08-13 – 2015-08-14 (×4): 2 [IU] via SUBCUTANEOUS
  Administered 2015-08-14: 3 [IU] via SUBCUTANEOUS
  Administered 2015-08-14 – 2015-08-16 (×3): 2 [IU] via SUBCUTANEOUS
  Administered 2015-08-16: 1 [IU] via SUBCUTANEOUS
  Administered 2015-08-16: 3 [IU] via SUBCUTANEOUS
  Administered 2015-08-18 (×2): 2 [IU] via SUBCUTANEOUS
  Administered 2015-08-18 – 2015-08-19 (×2): 1 [IU] via SUBCUTANEOUS
  Administered 2015-08-19: 2 [IU] via SUBCUTANEOUS
  Administered 2015-08-19 – 2015-08-20 (×2): 1 [IU] via SUBCUTANEOUS
  Administered 2015-08-20 (×2): 2 [IU] via SUBCUTANEOUS
  Administered 2015-08-21: 1 [IU] via SUBCUTANEOUS
  Administered 2015-08-21: 2 [IU] via SUBCUTANEOUS
  Administered 2015-08-21: 1 [IU] via SUBCUTANEOUS
  Administered 2015-08-22: 2 [IU] via SUBCUTANEOUS
  Administered 2015-08-22: 1 [IU] via SUBCUTANEOUS

## 2015-08-12 MED ORDER — FOLIC ACID 1 MG PO TABS
1.0000 mg | ORAL_TABLET | Freq: Every day | ORAL | Status: DC
  Administered 2015-08-12 – 2015-08-22 (×11): 1 mg via ORAL
  Filled 2015-08-12 (×11): qty 1

## 2015-08-12 MED ORDER — ALBUTEROL SULFATE (2.5 MG/3ML) 0.083% IN NEBU: 2.5000 mg | INHALATION_SOLUTION | RESPIRATORY_TRACT | Status: DC | PRN

## 2015-08-12 MED ORDER — ATORVASTATIN CALCIUM 20 MG PO TABS
20.0000 mg | ORAL_TABLET | Freq: Every day | ORAL | Status: DC
  Administered 2015-08-12 – 2015-08-22 (×11): 20 mg via ORAL
  Filled 2015-08-12 (×11): qty 1

## 2015-08-12 MED ORDER — FLUTICASONE PROPIONATE HFA 110 MCG/ACT IN AERO: 2.0000 | INHALATION_SPRAY | Freq: Two times a day (BID) | RESPIRATORY_TRACT | Status: DC

## 2015-08-12 MED ORDER — FUROSEMIDE 10 MG/ML IJ SOLN
60.0000 mg | Freq: Two times a day (BID) | INTRAMUSCULAR | Status: DC
  Administered 2015-08-12 – 2015-08-13 (×4): 60 mg via INTRAVENOUS
  Filled 2015-08-12 (×3): qty 6

## 2015-08-12 MED ORDER — COLCHICINE 0.6 MG PO TABS
0.6000 mg | ORAL_TABLET | Freq: Every day | ORAL | Status: DC
  Administered 2015-08-12 – 2015-08-22 (×11): 0.6 mg via ORAL
  Filled 2015-08-12 (×11): qty 1

## 2015-08-12 MED ORDER — FLUOXETINE HCL 20 MG PO CAPS
20.0000 mg | ORAL_CAPSULE | Freq: Every day | ORAL | Status: DC
  Administered 2015-08-12 – 2015-08-22 (×11): 20 mg via ORAL
  Filled 2015-08-12 (×11): qty 1

## 2015-08-12 MED ORDER — ALBUTEROL SULFATE (2.5 MG/3ML) 0.083% IN NEBU
2.5000 mg | INHALATION_SOLUTION | RESPIRATORY_TRACT | Status: DC
  Administered 2015-08-12: 2.5 mg via RESPIRATORY_TRACT
  Filled 2015-08-12: qty 3

## 2015-08-12 MED ORDER — POTASSIUM CHLORIDE CRYS ER 20 MEQ PO TBCR
20.0000 meq | EXTENDED_RELEASE_TABLET | Freq: Every day | ORAL | Status: DC
  Administered 2015-08-12 – 2015-08-16 (×5): 20 meq via ORAL
  Filled 2015-08-12 (×5): qty 1

## 2015-08-12 MED ORDER — IPRATROPIUM BROMIDE 0.02 % IN SOLN
0.5000 mg | RESPIRATORY_TRACT | Status: DC
  Administered 2015-08-12: 0.5 mg via RESPIRATORY_TRACT
  Filled 2015-08-12: qty 2.5

## 2015-08-12 MED ORDER — LEVOTHYROXINE SODIUM 75 MCG PO TABS
187.0000 ug | ORAL_TABLET | Freq: Every day | ORAL | Status: DC
  Administered 2015-08-12 – 2015-08-22 (×11): 187 ug via ORAL
  Filled 2015-08-12 (×24): qty 1

## 2015-08-12 MED ORDER — OXYCODONE HCL 5 MG PO TABS
10.0000 mg | ORAL_TABLET | Freq: Three times a day (TID) | ORAL | Status: DC | PRN
  Administered 2015-08-12 – 2015-08-22 (×17): 10 mg via ORAL
  Filled 2015-08-12 (×18): qty 2

## 2015-08-12 MED ORDER — ASPIRIN EC 325 MG PO TBEC
325.0000 mg | DELAYED_RELEASE_TABLET | Freq: Every day | ORAL | Status: DC
  Administered 2015-08-12 – 2015-08-22 (×11): 325 mg via ORAL
  Filled 2015-08-12 (×11): qty 1

## 2015-08-12 MED ORDER — VANCOMYCIN HCL IN DEXTROSE 750-5 MG/150ML-% IV SOLN
750.0000 mg | Freq: Two times a day (BID) | INTRAVENOUS | Status: DC
  Administered 2015-08-12 – 2015-08-13 (×4): 750 mg via INTRAVENOUS
  Filled 2015-08-12 (×6): qty 150

## 2015-08-12 MED ORDER — PIPERACILLIN-TAZOBACTAM 3.375 G IVPB
3.3750 g | Freq: Three times a day (TID) | INTRAVENOUS | Status: DC
  Administered 2015-08-12 – 2015-08-17 (×17): 3.375 g via INTRAVENOUS
  Filled 2015-08-12 (×19): qty 50

## 2015-08-12 MED ORDER — BUDESONIDE 0.25 MG/2ML IN SUSP
0.2500 mg | Freq: Two times a day (BID) | RESPIRATORY_TRACT | Status: DC
  Administered 2015-08-12 – 2015-08-22 (×20): 0.25 mg via RESPIRATORY_TRACT
  Filled 2015-08-12 (×21): qty 2

## 2015-08-12 MED ORDER — IPRATROPIUM-ALBUTEROL 0.5-2.5 (3) MG/3ML IN SOLN
3.0000 mL | Freq: Four times a day (QID) | RESPIRATORY_TRACT | Status: DC
  Administered 2015-08-12 – 2015-08-13 (×6): 3 mL via RESPIRATORY_TRACT
  Filled 2015-08-12 (×6): qty 3

## 2015-08-12 NOTE — Consult Note (Addendum)
WOC wound consult note Reason for Consult: Consult requested for bilat Una boots.  Pt is confused and does not realize he is in the hospital, or when his Una boots were changed prior to admission. Wound type: No open wounds or drainage when Una boots were removed to assess BLE.  Appears to be applied to control edema. Periwound: Generalized erythremia, slight edema to BLE Dressing procedure/placement/frequency: Ortho tech called to apply bilat Una boots and coban and change Q Tues and Fri. Please re-consult if further assistance is needed.  Thank-you,  Julien Girt MSN, [place], [place], [place], [place]

## 2015-08-12 NOTE — Progress Notes (Signed)
UR Completed Yuriana Gaal Graves-Bigelow, RN,BSN [phone]  

## 2015-08-12 NOTE — Telephone Encounter (Signed)
Thanks

## 2015-08-12 NOTE — Progress Notes (Signed)
Orthopedic Tech Progress Note Patient Details:  [name] Aug 17, 1946 [id number]  Ortho Devices Type of Ortho Device: Louretta Parma boot Ortho Device/Splint Location: Bilateral unna boots Ortho Device/Splint Interventions: Application   [place] Pink 08/12/2015, 3:08 PM

## 2015-08-12 NOTE — Progress Notes (Signed)
ANTIBIOTIC CONSULT NOTE - INITIAL  Pharmacy Consult for Vancomycin/Zosyn  Indication: rule out pneumonia  Allergies  Allergen Reactions  . Erythromycin Hives    Oramycin    Patient Measurements: Weight: 253 lb (114.76 kg)  Vital Signs: Temp: 98.6 F (37 C) (11/01 0143) Temp Source: Oral (11/01 0143) BP: 160/85 mmHg (11/01 0143) Pulse Rate: 91 (11/01 0143) Intake/Output from previous day: 10/31 0701 - 11/01 0700 In: -  Out: 550 [Urine:550] Intake/Output from this shift: Total I/O In: -  Out: 550 [Urine:550]  Labs:  Recent Labs  08/11/15 1616  WBC 6.1  HGB 10.9*  PLT 93*  CREATININE 1.56*   Estimated Creatinine Clearance: 53.4 mL/min (by C-G formula based on Cr of 1.56).  Medical History: Past Medical History  Diagnosis Date  . CHRONIC OBSTRUCTIVE PULMONARY DISEASE 06/20/2009  . CAROTID STENOSIS 06/20/2009    A. 08/2001 s/p L CEA;  B.   09/14/11 - Carotid U/S - 40-59% bilateral stenosis, left CEA patch angioplasty is patent  . DM 06/20/2009  . CAD 06/20/2009    A.  08/2000 - s/p CABG x 4 - LIMA-LAD, Left Radial-OM, VG-DIAG, VG-RCA;  B. Neg. MV  2010  . HYPERLIPIDEMIA 06/20/2009  . HYPERTENSION 06/20/2009  . Hypothyroidism   . Low back pain   . Asthma     as child  . Pneumonia   . Persistent atrial fibrillation (HCC)     Not felt to be coumadin candidate 2/2 ETOH use.  Marland Kitchen History of tobacco abuse     remote - quit 1970  . Morbidly obese ([place])   . Chronic diastolic heart failure, NYHA class 3 ([place])     Followed by CHF Clinic --> Echo 04/2014: EF 50-35%, Diastolic DysFxn - elevated LVEDP & LAP. Mod Dil LA & RA.  . Falls frequently   . Hx of cardiovascular stress test 05/2014    Lexiscan Myoview (8/15):  No ischemia; EF 63% - Normal Study  . Breast cancer, right breast ([place])   . Gout   . OBSTRUCTIVE SLEEP APNEA 06/20/2009    does not use cpap  . Stroke [facility])     found on MRI in Oct. 2015.   Marland Kitchen Shortness of breath dyspnea     occasional  . Depression     takes  Prozac  . GERD (gastroesophageal reflux disease)     takes Prilosec  . Arthritis   . Neuromuscular disorder (HCC)     neuropathy in both legs  . Constipation   . ETOH abuse     no alcohol since Oct. 2015  . 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 CHF (congestive heart failure) ([place])   . Supplemental oxygen dependent     hs  . Bilateral renal cysts   . Kidney disease     Stage 3   Assessment: 69 y/o M here with syncopal episode, found to have bilateral airspace opacities on CT, WBC WNL, known CKD, other labs/meds reviewed.   Goal of Therapy:  Vancomycin trough level 15-20 mcg/ml  Plan:  -Vancomycin 750 mg IV q12h -Zosyn 3.375G IV q8h to be infused over 4 hours -Trend WBC, temp, renal function -Drug levels as indicated   Narda Bonds 08/12/2015,2:10 AM

## 2015-08-12 NOTE — Care Management Note (Addendum)
Case Management Note  Patient Details  Name: [name] MRN: [id number] Date of Birth: June 03, 1946  Subjective/Objective: Pt admitted for Acute Respiratory Failure. Pt is from home. Pt is active with [facility] include RN,/PT/ OT.                   Action/Plan: CM did call [facility] to clarify Services. If the plan continues to be home- pt will need resumption orders and F2F once medically stable for d/c. CM will continue to monitor for additional needs.    Expected Discharge Date:                  Expected Discharge Plan:  [place]  In-House Referral:     Discharge planning Services  CM Consult  Post Acute Care Choice:  Resumption of Svcs/PTA Provider, Home Health Choice offered to:  Patient  DME Arranged:  N/A DME Agency:     HH Arranged:  RN, PT, OT HH Agency:  [facility]  Status of Service:  Completed. Medicare Important Message Given:    Date Medicare IM Given:    Medicare IM give by:    Date Additional Medicare IM Given:    Additional Medicare Important Message give by:     If discussed at [place] of Stay Meetings, dates discussed:    Additional Comments: 09-01-15 08-22-15 Jacqlyn Krauss, RN,BSN [phone] Pt is planned for d/c today. Plan for SNF placement @ [facility]. CSW assisting with disposition needs. No further needs from CM at this time.   Bethena Roys, RN 08/12/2015, 1:09 PM

## 2015-08-12 NOTE — H&P (Addendum)
[facility] History and Physical  [name] FMB:[id number] DOB: 1946-07-02 DOA: 08/11/2015  Referring physician: Dr. Freda Munro. PCP: Jene Every, MD  Specialists: None  Chief Complaint: Shortness of breath.  History obtained from patient's daughter and the ER physician. Patient appears mildly confused.  HPI: [name] is a 69 y.o. male history of CAD status post CABG, proximal atrial fibrillation not on anticoagulation secondary to history of falls and alcoholism, diastolic CHF, breast cancer on tamoxifen, hypertension, diabetes mellitus, hyperlipidemia was brought to the ER after patient was found to be weak and hypoxic. As per patient's daughter patient woke up yesterday morning and was finding it difficult to walk and almost fell. Patient was helped to the chair by patient's family members. R patient tried to walk again following which patient almost had a syncopal episode and EMS was called and patient also was found to be hypoxic. Patient also had 2 episodes of nausea and vomiting. In the ER patient was found to be hypoxic and CT angiogram of the chest shows possible pneumonia and the patient also was found to remain agitated and confused. CT of the head did not show anything acute patient has been admitted for further management of acute respiratory failure and acute encephalopathy. As per patient's daughter patient's blood pressure was slightly low side yesterday initially. Patient's Lasix was recently increased due to increasing fluid retention. On exam patient is following my commands but at times appears confused. Denies any chest pain or abdominal pain at this time.   Review of Systems: As presented in the history of presenting illness, rest negative.  Past Medical History  Diagnosis Date  . CHRONIC OBSTRUCTIVE PULMONARY DISEASE 06/20/2009  . CAROTID STENOSIS 06/20/2009    A. 08/2001 s/p L CEA;  B.   09/14/11 - Carotid U/S - 40-59% bilateral stenosis, left CEA patch  angioplasty is patent  . DM 06/20/2009  . CAD 06/20/2009    A.  08/2000 - s/p CABG x 4 - LIMA-LAD, Left Radial-OM, VG-DIAG, VG-RCA;  B. Neg. MV  2010  . HYPERLIPIDEMIA 06/20/2009  . HYPERTENSION 06/20/2009  . Hypothyroidism   . Low back pain   . Asthma     as child  . Pneumonia   . Persistent atrial fibrillation (HCC)     Not felt to be coumadin candidate 2/2 ETOH use.  Marland Kitchen History of tobacco abuse     remote - quit 1970  . Morbidly obese ([place])   . Chronic diastolic heart failure, NYHA class 3 ([place])     Followed by [facility] --> Echo 04/2014: EF 70-17%, Diastolic DysFxn - elevated LVEDP & LAP. Mod Dil LA & RA.  . Falls frequently   . Hx of cardiovascular stress test 05/2014    Lexiscan Myoview (8/15):  No ischemia; EF 63% - Normal Study  . Breast cancer, right breast ([place])   . Gout   . OBSTRUCTIVE SLEEP APNEA 06/20/2009    does not use cpap  . Stroke [facility])     found on MRI in Oct. 2015.   Marland Kitchen Shortness of breath dyspnea     occasional  . Depression     takes Prozac  . GERD (gastroesophageal reflux disease)     takes Prilosec  . Arthritis   . Neuromuscular disorder (HCC)     neuropathy in both legs  . Constipation   . ETOH abuse     no alcohol since Oct. 2015  . 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 CHF (congestive heart failure) ([place])   . Supplemental oxygen dependent     hs  . Bilateral renal cysts   . Kidney disease     Stage 3   Past Surgical History  Procedure Laterality Date  . Carotid endarterectomy  2002    left  . Coronary artery bypass graft      x 4 - 2001  . Umbilical hernia repair N/A 01/28/2014    Procedure: HERNIA REPAIR UMBILICAL ADULT/INC;  Surgeon: Harl Bowie, MD;  Location: [place];  Service: General;  Laterality: N/A;  . Laparotomy N/A 01/28/2014    Procedure: EXPLORATORY LAPAROTOMY;  Surgeon: Harl Bowie, MD;  Location: [place];  Service: General;  Laterality: N/A;  . Bowel resection N/A  01/28/2014    Procedure: SMALL BOWEL RESECTION;  Surgeon: Harl Bowie, MD;  Location: [place];  Service: General;  Laterality: N/A;  . Hernia repair    . Colonoscopy    . Total mastectomy Right 11/11/2014    Procedure: RIGHT TOTAL MASTECTOMY;  Surgeon: Excell Seltzer, MD;  Location: [place];  Service: General;  Laterality: Right;  . Cataract extraction w/phaco Left 04/08/2015    Procedure: CATARACT EXTRACTION PHACO AND INTRAOCULAR LENS PLACEMENT ([place]);  Surgeon: Birder Robson, MD;  Location: [facility];  Service: Ophthalmology;  Laterality: Left;  [place] 00:36 AP% 23.7 CDE 8.72 fluid pack [id number] H  . Eye surgery Left     Catarct  . Laparotomy N/A 06/02/2015    Procedure: EXPLORATORY LAPAROTOMY;  Surgeon: Coralie Keens, MD;  Location: [place];  Service: General;  Laterality: N/A;  . Bowel resection N/A 06/02/2015    Procedure: SMALL BOWEL RESECTION;  Surgeon: Coralie Keens, MD;  Location: [place];  Service: General;  Laterality: N/A;   Social History:  reports that he quit smoking about 46 years ago. He has never used smokeless tobacco. He reports that he drinks alcohol. He reports that he uses illicit drugs (Marijuana). Where does patient live home. Can patient participate in ADLs? Yes.  Allergies  Allergen Reactions  . Erythromycin Hives    Oramycin    Family History:  Family History  Problem Relation Age of Onset  . CVA Mother     ?  Marland Kitchen Cancer Mother     unknown type of cancer  . CVA Father     ?  Marland Kitchen Heart attack Neg Hx   . Stomach cancer Brother 40    stomach cancer      Prior to Admission medications   Medication Sig Start Date End Date Taking? Authorizing Provider  albuterol-ipratropium (COMBIVENT) 18-103 MCG/ACT inhaler Inhale 2 puffs into the lungs 2 (two) times daily. 06/18/15  Yes Ivan Anchors Love, PA-C  allopurinol (ZYLOPRIM) 300 MG tablet Take 1 tablet (300 mg total) by mouth daily. 08/02/14  Yes Daniel J Angiulli, PA-C  aspirin EC 325 MG EC tablet Take 1 tablet  (325 mg total) by mouth daily. 08/02/14  Yes Daniel J Angiulli, PA-C  atorvastatin (LIPITOR) 20 MG tablet Take 1 tablet (20 mg total) by mouth daily. 08/02/14  Yes Daniel J Angiulli, PA-C  carvedilol (COREG) 3.125 MG tablet Take 2 tablets (6.25 mg total) by mouth 2 (two) times daily with a meal. 07/07/15  Yes Bonnielee Haff, MD  colchicine 0.6 MG tablet Take 0.6 mg by mouth daily. 01/13/15  Yes Historical Provider, MD  FLUoxetine (PROZAC) 20 MG capsule Take 1 capsule (20 mg total) by mouth daily. 06/18/15  Yes Ivan Anchors Love, PA-C  fluticasone (FLOVENT HFA) 110 MCG/ACT inhaler Inhale 2 puffs into the lungs 2 (two) times daily.   Yes Historical Provider, MD  folic acid (FOLVITE) 1 MG tablet Take 1 tablet (1 mg total) by mouth daily. 08/02/14  Yes Daniel J Angiulli, PA-C  furosemide (LASIX) 40 MG tablet Take 1 tablet (40 mg total) by mouth daily. Pt take 120 mg in AM and 80 mg in PM 07/15/15  Yes Scott T Weaver, PA-C  furosemide (LASIX) 80 MG tablet Take 1 tablet (80 mg total) by mouth as directed. take 120 mg in AM and 80 mg in afternoon 07/15/15  Yes Scott T Weaver, PA-C  gabapentin (NEURONTIN) 300 MG capsule Take 1 capsule (300 mg total) by mouth 2 (two) times daily. 08/02/14  Yes Daniel J Angiulli, PA-C  levothyroxine (SYNTHROID, LEVOTHROID) 125 MCG tablet Take 1.5 tablets (187 mcg total) by mouth daily before breakfast. 08/02/14  Yes Daniel J Angiulli, PA-C  magnesium oxide (MAG-OX) 400 (241.3 MG) MG tablet Take 1 tablet (400 mg total) by mouth 2 (two) times daily. 06/26/15  Yes Scott Joylene Draft, PA-C  omeprazole (PRILOSEC) 20 MG capsule Take 1 capsule (20 mg total) by mouth daily. 08/02/14  Yes Daniel J Angiulli, PA-C  Oxycodone HCl 10 MG TABS Take 10 mg by mouth 3 (three) times daily as needed (pain).   Yes Historical Provider, MD  potassium chloride SA (KLOR-CON M20) 20 MEQ tablet Take 2 tablets (40 mEq total) by mouth as directed. 40 meq in the AM, 20 meq in the PM 07/16/15  Yes Scott T Kathlen Mody, PA-C   tamoxifen (NOLVADEX) 20 MG tablet Take 1 tablet (20 mg total) by mouth daily. Patient taking differently: Take 20 mg by mouth at bedtime.  09/09/14  Yes Nicholas Lose, MD  venlafaxine XR (EFFEXOR-XR) 37.5 MG 24 hr capsule TAKE ONE CAPSULE BY MOUTH DAILY 07/14/15  Yes Nicholas Lose, MD  metolazone (ZAROXOLYN) 2.5 MG tablet TAKE 1 TABLET BY MOUTH DAILY 30 MINUTES PRIOR TO LASIX DOSAGE 07/09/15   Historical Provider, MD  neomycin-bacitracin-polymyxin (NEOSPORIN) OINT Apply 1 application topically 2 (two) times daily. To left 3rd toe Patient not taking: Reported on 08/11/2015 07/07/15   Bonnielee Haff, MD    Physical Exam: Filed Vitals:   08/11/15 2100 08/11/15 2137 08/11/15 2349 08/11/15 2350  BP: 158/115 160/85  133/76  Pulse: 76 105 88   Temp:      TempSrc:      Resp: 23 21 23    SpO2: 92% 94% 95%      General:  Obese not in distress.  Eyes: Anicteric no pallor.  ENT: No discharge from the ears eyes nose and mouth.  Neck: No JVD appreciated. No mass felt.  Cardiovascular: S1-S2 heard.  Respiratory: No rhonchi or crepitations.  Abdomen: Enterocutaneous fistula seen with no discharge. Soft nontender bowel sounds present.  Skin: Chronic lower extremity wounds on dressing.  Musculoskeletal: Bilateral lower extremity dressing.  Psychiatric: Mildly confused.  Neurologic: Alert awake mildly confused oriented to his name and follows commands. Moves extremities but complete neurological exam difficult at this time.  Labs on Admission:  Basic Metabolic Panel:  Recent Labs Lab 08/11/15 1616  NA 136  K 4.8  CL 98*  CO2 27  GLUCOSE 221*  BUN 41*  CREATININE 1.56*  CALCIUM 9.0   Liver Function Tests: No results for input(s): AST, ALT, ALKPHOS, BILITOT, PROT, ALBUMIN in the last 168 hours. No results for input(s): LIPASE, AMYLASE in the  last 168 hours. No results for input(s): AMMONIA in the last 168 hours. CBC:  Recent Labs Lab 08/11/15 1616  WBC 6.1  HGB 10.9*  HCT  37.1*  MCV 101.9*  PLT 93*   Cardiac Enzymes: No results for input(s): CKTOTAL, CKMB, CKMBINDEX, TROPONINI in the last 168 hours.  BNP (last 3 results)  Recent Labs  06/13/15 0445 07/01/15 1627 08/11/15 1616  BNP 346.5* 331.3* 299.1*    ProBNP (last 3 results) No results for input(s): PROBNP in the last 8760 hours.  CBG:  Recent Labs Lab 08/11/15 1721  GLUCAP 181*    Radiological Exams on Admission: Dg Chest 2 View  08/11/2015  CLINICAL DATA:  Shortness of breath. Altered mental status. Choked while eating this morning. Possible aspiration. EXAM: CHEST  2 VIEW COMPARISON:  07/04/2015. FINDINGS: Stable enlarged cardiac silhouette and post CABG changes. Interval mild patchy and linear density at the right lung base. Stable prominence of the interstitial markings. Mild increase in prominence of the pulmonary vasculature. Breathing motion blurring on the lateral view. IMPRESSION: 1. Atelectasis at the right lung base. Superimposed aspiration is unlikely based on the current appearance. 2. Stable cardiomegaly and mild chronic interstitial lung disease with interval mild pulmonary vascular congestion. Electronically Signed   By: Claudie Revering M.D.   On: 08/11/2015 17:07   Ct Head Wo Contrast  08/11/2015  CLINICAL DATA:  Initial encounter for 69 YOM walking using his walker and fell backwards and hit his head today 08/11/15. EXAM: CT HEAD WITHOUT CONTRAST CT CERVICAL SPINE WITHOUT CONTRAST TECHNIQUE: Multidetector CT imaging of the head and cervical spine was performed following the standard protocol without intravenous contrast. Multiplanar CT image reconstructions of the cervical spine were also generated. COMPARISON:  Head CT of 05/26/2015. No prior cervical spine imaging. FINDINGS: CT HEAD FINDINGS Sinuses/Soft tissues: Mild left parietal scalp soft tissue thickening on image/series 67/4. Right maxillary sinus mucous retention cyst or polyp. No skull fracture. Clear mastoid air cells.  Intracranial: Cerebral atrophy. Moderate low density in the periventricular white matter likely related to small vessel disease. Left cerebellar remote infarct. No mass lesion, hemorrhage, hydrocephalus, acute infarct, intra-axial, or extra-axial fluid collection. CT CERVICAL SPINE FINDINGS Spinal visualization through the bottom of T4. Prevertebral soft tissues are within normal limits. Dense carotid atherosclerosis bilaterally. No apical pneumothorax. Advanced spondylosis. Left worse than right neural foraminal narrowing at multiple levels. Mild central canal stenosis, most significant at C3-4. Prior median sternotomy. Maintenance of vertebral body height. Trace C3-4 retrolisthesis. From C7 inferiorly are mildly degraded secondary to patient body habitus. Facets are well-aligned. Coronal reformats demonstrate a normal C1-C2 articulation. IMPRESSION: 1. Mild left parietal scalp soft tissue swelling. No acute intracranial abnormality. 2. Advanced cervical spondylosis. Trace C3-4 is favored to be degenerative. Otherwise, no acute findings in the cervical spine identified. 3.  Cerebral atrophy and small vessel ischemic change. Electronically Signed   By: Abigail Miyamoto M.D.   On: 08/11/2015 16:50   Ct Angio Chest Pe W/cm &/or Wo Cm  08/11/2015  CLINICAL DATA:  Worsening dyspnea.  Altered mental status. EXAM: CT ANGIOGRAPHY CHEST WITH CONTRAST TECHNIQUE: Multidetector CT imaging of the chest was performed using the standard protocol during bolus administration of intravenous contrast. Multiplanar CT image reconstructions and MIPs were obtained to evaluate the vascular anatomy. CONTRAST:  89mL OMNIPAQUE IOHEXOL 350 MG/ML SOLN COMPARISON:  08/28/2014 FINDINGS: Cardiovascular: There is good opacification of the pulmonary arteries. There is no pulmonary embolism. The thoracic aorta is normal in caliber and intact.  There is extensive calcified coronary artery plaque. There are multiple coronary artery bypass grafts.  Lungs: There are multifocal confluent airspace opacities, central predominant. Considerations include alveolar edema, infectious infiltrate. Hemorrhage or neoplasm less likely but not entirely excluded. Central airways: Patent Effusions: None Lymphadenopathy: Mildly prominent hilar and mediastinal nodes, more likely reactive Esophagus: Unremarkable Upper abdomen: Unremarkable Musculoskeletal: No significant abnormality except for moderate thoracic degenerative disc disease, kyphosis, and prior sternotomy. Review of the MIP images confirms the above findings. IMPRESSION: 1. Negative for acute pulmonary embolism 2. Confluent airspace opacities bilaterally, right worse than left. Infectious infiltrate is a leading consideration. Alveolar edema less likely but not excluded. There also are mildly prominent hilar and mediastinal nodes which may be reactive. Electronically Signed   By: Andreas Newport M.D.   On: 08/11/2015 23:21   Ct Cervical Spine Wo Contrast  08/11/2015  CLINICAL DATA:  Initial encounter for 69 YOM walking using his walker and fell backwards and hit his head today 08/11/15. EXAM: CT HEAD WITHOUT CONTRAST CT CERVICAL SPINE WITHOUT CONTRAST TECHNIQUE: Multidetector CT imaging of the head and cervical spine was performed following the standard protocol without intravenous contrast. Multiplanar CT image reconstructions of the cervical spine were also generated. COMPARISON:  Head CT of 05/26/2015. No prior cervical spine imaging. FINDINGS: CT HEAD FINDINGS Sinuses/Soft tissues: Mild left parietal scalp soft tissue thickening on image/series 67/4. Right maxillary sinus mucous retention cyst or polyp. No skull fracture. Clear mastoid air cells. Intracranial: Cerebral atrophy. Moderate low density in the periventricular white matter likely related to small vessel disease. Left cerebellar remote infarct. No mass lesion, hemorrhage, hydrocephalus, acute infarct, intra-axial, or extra-axial fluid collection.  CT CERVICAL SPINE FINDINGS Spinal visualization through the bottom of T4. Prevertebral soft tissues are within normal limits. Dense carotid atherosclerosis bilaterally. No apical pneumothorax. Advanced spondylosis. Left worse than right neural foraminal narrowing at multiple levels. Mild central canal stenosis, most significant at C3-4. Prior median sternotomy. Maintenance of vertebral body height. Trace C3-4 retrolisthesis. From C7 inferiorly are mildly degraded secondary to patient body habitus. Facets are well-aligned. Coronal reformats demonstrate a normal C1-C2 articulation. IMPRESSION: 1. Mild left parietal scalp soft tissue swelling. No acute intracranial abnormality. 2. Advanced cervical spondylosis. Trace C3-4 is favored to be degenerative. Otherwise, no acute findings in the cervical spine identified. 3.  Cerebral atrophy and small vessel ischemic change. Electronically Signed   By: Abigail Miyamoto M.D.   On: 08/11/2015 16:50    EKG: Independently reviewed. Atrial fibrillation rate controlled at 87 bpm  Assessment/Plan Principal Problem:   Acute respiratory failure with hypoxia (HCC) Active Problems:   Obstructive sleep apnea   Coronary atherosclerosis of native coronary artery   PAF (paroxysmal atrial fibrillation) (HCC)   Hypothyroidism   COPD (chronic obstructive pulmonary disease) (HCC)   Enterocutaneous fistula   Diabetes mellitus type 2, controlled ([place])   A-fib (HCC)   Acute encephalopathy   1. Acute respiratory failure with hypoxia - probably a combination of CHF and aspiration pneumonia. Patient has been placed on vancomycin and Zosyn. Patient is also on Lasix 60 mg IV every 12 hourly. Patient used to be on larger doses at home but since patient's blood pressure was initially low normal as per patient's daughter I have decrease patient's dose of Lasix at this time. Closely follow respiratory status intake output and metabolic panel. Last EF measured in September 2016 was  60-65%. 2. Acute encephalopathy - patient was finding it difficult to walk since morning. Differentials include stroke. MRI  brain has been ordered which is pending. Check ammonia levels and since patient also has history of alcohol abuse previously check thiamine levels. Check ABG for carbon dioxide levels. 3. Paroxysmal atrial fibrillation - presently rate controlled. Not on anti-granulation secondary risk of falls and history of alcohol abuse. Continue metoprolol. 4. CAD status post CABG - has not complained of any chest pain. We will cycle cardiac markers continue metoprolol aspirin and statins. 5. Hypothyroidism on Synthroid. 6. Chronic kidney disease stage III - positive follow metabolic panel. Patient's creatinine has been gradually worsening over the last 1 month. Patient's Lasix also has been recently increased. 7. History of enterocutaneous fistula - KUB has been ordered. I don't see any active discharge at this time. 8. Recent admission for sepsis secondary to cellulitis - wound team has been consulted for lower extremity wounds. 9. OSA on C Pap. 10. Chronic anemia - follow CBC. 11. History of enterocutaneous fistula. 12. Chronic lower extremity wounds. 13. History of alcohol abuse - as per patient's daughter patient has not had any alcohol last 3 months.  I have reviewed patient's old charts and labs. Personally reviewed patient's EKG and chest x-ray.   DVT Prophylaxis Lovenox.  Code Status: Full code.  Family Communication: Patient's daughter.  Disposition Plan: Admit to inpatient.    Kateryn Marasigan N. [facility] Pager [phone].  If 7PM-7AM, please contact night-coverage [email or web address] Password [email or web address] 08/12/2015, 12:17 AM

## 2015-08-12 NOTE — Progress Notes (Addendum)
Patient seen and evaluated earlier this morning by associate. Please refer to H&P for details regarding assessment and plan.  Physical exam Gen.: Patient in no acute distress Cardiovascular: No cyanosis Pulmonary: Equal chest rise, no wheezes  We'll plan on reassessing next a.m.  Alece Koppel  Addendum:  mild elevation in troponin most likely demand ischemia. Patient not complaining of chest pain and troponin levels trending down. In context of patient with shortness of breath and elevated BNP  MRI of brain report reviewed. Will consult Neurology for disposition recommendations

## 2015-08-12 NOTE — Consult Note (Signed)
Referring Physician: Wendee Beavers    Chief Complaint: incidental stroke  HPI:                                                                                                                                         [name] is an 69 y.o. male brought to hospital after falling multiple times.  On arrival he was found to be hypoxic and CT angio of chest showed possible PNA. HE was noted to be confused in ED and MRI brain was obtained. MRI brain showed a questionable punctate infarct in the right frontal lobe. Patietn currently in in room, knows he is in the hospital but is not a great historian. HE denies any localizing or lateralizing symptoms but is SOB. HE has known PAF and per last cardiology note he is not on AC due to ETOH abuse. At this time INR is pending.   Date last known well: Unable to determine Time last known well: Unable to determine tPA Given: No: asymptomatic     Past Medical History  Diagnosis Date  . CHRONIC OBSTRUCTIVE PULMONARY DISEASE 06/20/2009  . CAROTID STENOSIS 06/20/2009    A. 08/2001 s/p L CEA;  B.   09/14/11 - Carotid U/S - 40-59% bilateral stenosis, left CEA patch angioplasty is patent  . DM 06/20/2009  . CAD 06/20/2009    A.  08/2000 - s/p CABG x 4 - LIMA-LAD, Left Radial-OM, VG-DIAG, VG-RCA;  B. Neg. MV  2010  . HYPERLIPIDEMIA 06/20/2009  . HYPERTENSION 06/20/2009  . Hypothyroidism   . Low back pain   . Asthma     as child  . Pneumonia   . Persistent atrial fibrillation (HCC)     Not felt to be coumadin candidate 2/2 ETOH use.  Marland Kitchen History of tobacco abuse     remote - quit 1970  . Morbidly obese ([place])   . Chronic diastolic heart failure, NYHA class 3 ([place])     Followed by [facility] --> Echo 04/2014: EF 53-61%, Diastolic DysFxn - elevated LVEDP & LAP. Mod Dil LA & RA.  . Falls frequently   . Hx of cardiovascular stress test 05/2014    Lexiscan Myoview (8/15):  No ischemia; EF 63% - Normal Study  . Breast cancer, right breast ([place])   . Gout   . OBSTRUCTIVE  SLEEP APNEA 06/20/2009    does not use cpap  . Stroke [name])     found on MRI in Oct. 2015.   Marland Kitchen Shortness of breath dyspnea     occasional  . Depression     takes Prozac  . GERD (gastroesophageal reflux disease)     takes Prilosec  . Arthritis   . Neuromuscular disorder (HCC)     neuropathy in both legs  . Constipation   . ETOH abuse     no alcohol since Oct. 2015  . 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 CHF (congestive heart failure) ([place])   . Supplemental oxygen dependent     hs  . Bilateral renal cysts   . Kidney disease     Stage 3    Past Surgical History  Procedure Laterality Date  . Carotid endarterectomy  2002    left  . Coronary artery bypass graft      x 4 - 2001  . Umbilical hernia repair N/A 01/28/2014    Procedure: HERNIA REPAIR UMBILICAL ADULT/INC;  Surgeon: Harl Bowie, MD;  Location: [place];  Service: General;  Laterality: N/A;  . Laparotomy N/A 01/28/2014    Procedure: EXPLORATORY LAPAROTOMY;  Surgeon: Harl Bowie, MD;  Location: [place];  Service: General;  Laterality: N/A;  . Bowel resection N/A 01/28/2014    Procedure: SMALL BOWEL RESECTION;  Surgeon: Harl Bowie, MD;  Location: [place];  Service: General;  Laterality: N/A;  . Hernia repair    . Colonoscopy    . Total mastectomy Right 11/11/2014    Procedure: RIGHT TOTAL MASTECTOMY;  Surgeon: Excell Seltzer, MD;  Location: [place];  Service: General;  Laterality: Right;  . Cataract extraction w/phaco Left 04/08/2015    Procedure: CATARACT EXTRACTION PHACO AND INTRAOCULAR LENS PLACEMENT ([place]);  Surgeon: Birder Robson, MD;  Location: [facility];  Service: Ophthalmology;  Laterality: Left;  [place] 00:36 AP% 23.7 CDE 8.72 fluid pack [id number]  . Eye surgery Left     Catarct  . Laparotomy N/A 06/02/2015    Procedure: EXPLORATORY LAPAROTOMY;  Surgeon: Coralie Keens, MD;  Location: [place];  Service: General;  Laterality: N/A;  . Bowel  resection N/A 06/02/2015    Procedure: SMALL BOWEL RESECTION;  Surgeon: Coralie Keens, MD;  Location: [facility];  Service: General;  Laterality: N/A;    Family History  Problem Relation Age of Onset  . CVA Mother     ?  Marland Kitchen Cancer Mother     unknown type of cancer  . CVA Father     ?  Marland Kitchen Heart attack Neg Hx   . Stomach cancer Brother 56    stomach cancer   Social History:  reports that he quit smoking about 46 years ago. He has never used smokeless tobacco. He reports that he drinks alcohol. He reports that he uses illicit drugs (Marijuana).  Allergies:  Allergies  Allergen Reactions  . Erythromycin Hives    Oramycin    Medications:                                                                                                                           Prior to Admission:  Prescriptions prior to admission  Medication Sig Dispense Refill Last Dose  . albuterol-ipratropium (COMBIVENT) 18-103 MCG/ACT inhaler Inhale 2 puffs into the lungs 2 (two) times daily. 1 Inhaler 1 Past Week at Unknown time  . allopurinol (ZYLOPRIM) 300 MG tablet Take 1 tablet (  300 mg total) by mouth daily. 30 tablet 3 08/11/2015 at Unknown time  . aspirin EC 325 MG EC tablet Take 1 tablet (325 mg total) by mouth daily. 30 tablet 0 08/11/2015 at Unknown time  . atorvastatin (LIPITOR) 20 MG tablet Take 1 tablet (20 mg total) by mouth daily. 30 tablet 1 08/11/2015 at Unknown time  . carvedilol (COREG) 3.125 MG tablet Take 2 tablets (6.25 mg total) by mouth 2 (two) times daily with a meal. 60 tablet 1 08/11/2015 at 0800  . colchicine 0.6 MG tablet Take 0.6 mg by mouth daily.   08/11/2015 at Unknown time  . FLUoxetine (PROZAC) 20 MG capsule Take 1 capsule (20 mg total) by mouth daily. 30 capsule 1 08/11/2015 at Unknown time  . fluticasone (FLOVENT HFA) 110 MCG/ACT inhaler Inhale 2 puffs into the lungs 2 (two) times daily.   08/10/2015 at Unknown time  . folic acid (FOLVITE) 1 MG tablet Take 1 tablet (1 mg total) by  mouth daily. 30 tablet 1 08/11/2015 at Unknown time  . furosemide (LASIX) 40 MG tablet Take 1 tablet (40 mg total) by mouth daily. Pt take 120 mg in AM and 80 mg in PM 30 tablet 11 08/11/2015 at Unknown time  . furosemide (LASIX) 80 MG tablet Take 1 tablet (80 mg total) by mouth as directed. take 120 mg in AM and 80 mg in afternoon 60 tablet 11 08/11/2015 at Unknown time  . gabapentin (NEURONTIN) 300 MG capsule Take 1 capsule (300 mg total) by mouth 2 (two) times daily. 60 capsule 1 08/11/2015 at Unknown time  . levothyroxine (SYNTHROID, LEVOTHROID) 125 MCG tablet Take 1.5 tablets (187 mcg total) by mouth daily before breakfast. 30 tablet 3 08/11/2015 at Unknown time  . magnesium oxide (MAG-OX) 400 (241.3 MG) MG tablet Take 1 tablet (400 mg total) by mouth 2 (two) times daily. 60 tablet 11 08/11/2015 at Unknown time  . omeprazole (PRILOSEC) 20 MG capsule Take 1 capsule (20 mg total) by mouth daily. 30 capsule 1 08/11/2015 at Unknown time  . Oxycodone HCl 10 MG TABS Take 10 mg by mouth 3 (three) times daily as needed (pain).   08/11/2015 at Unknown time  . potassium chloride SA (KLOR-CON M20) 20 MEQ tablet Take 2 tablets (40 mEq total) by mouth as directed. 40 meq in the AM, 20 meq in the PM   08/11/2015 at Unknown time  . tamoxifen (NOLVADEX) 20 MG tablet Take 1 tablet (20 mg total) by mouth daily. (Patient taking differently: Take 20 mg by mouth at bedtime. ) 90 tablet 3 08/10/2015 at Unknown time  . venlafaxine XR (EFFEXOR-XR) 37.5 MG 24 hr capsule TAKE ONE CAPSULE BY MOUTH DAILY 30 capsule 6 08/11/2015 at Unknown time  . metolazone (ZAROXOLYN) 2.5 MG tablet TAKE 1 TABLET BY MOUTH DAILY 30 MINUTES PRIOR TO LASIX DOSAGE  0 Not Taking at Unknown time  . neomycin-bacitracin-polymyxin (NEOSPORIN) OINT Apply 1 application topically 2 (two) times daily. To left 3rd toe (Patient not taking: Reported on 08/11/2015) 14 g 0 Not Taking at Unknown time   Scheduled: . allopurinol  300 mg Oral Daily  . aspirin  EC  325 mg Oral Daily  . atorvastatin  20 mg Oral Daily  . budesonide (PULMICORT) nebulizer solution  0.25 mg Nebulization BID  . carvedilol  6.25 mg Oral BID WC  . colchicine  0.6 mg Oral Daily  . enoxaparin (LOVENOX) injection  40 mg Subcutaneous Q24H  . FLUoxetine  20 mg Oral Daily  .  folic acid  1 mg Oral Daily  . furosemide  60 mg Intravenous BID  . gabapentin  300 mg Oral BID  . insulin aspart  0-9 Units Subcutaneous TID WC  . ipratropium-albuterol  3 mL Nebulization Q6H  . levothyroxine  187 mcg Oral QAC breakfast  . magnesium oxide  400 mg Oral BID  . pantoprazole  40 mg Oral Daily  . piperacillin-tazobactam (ZOSYN)  IV  3.375 g Intravenous 3 times per day  . potassium chloride  20 mEq Oral QHS  . potassium chloride SA  40 mEq Oral Daily  . tamoxifen  20 mg Oral QHS  . vancomycin  750 mg Intravenous Q12H  . venlafaxine XR  37.5 mg Oral Daily    ROS:                                                                                                                                       History obtained from the patient  General ROS: negative for - chills, fatigue, fever, night sweats, weight gain or weight loss Psychological ROS: negative for - behavioral disorder, hallucinations, memory difficulties, mood swings or suicidal ideation Ophthalmic ROS: negative for - blurry vision, double vision, eye pain or loss of vision ENT ROS: negative for - epistaxis, nasal discharge, oral lesions, sore throat, tinnitus or vertigo Allergy and Immunology ROS: negative for - hives or itchy/watery eyes Hematological and Lymphatic ROS: negative for - bleeding problems, bruising or swollen lymph nodes Endocrine ROS: negative for - galactorrhea, hair pattern changes, polydipsia/polyuria or temperature intolerance Respiratory ROS: negative for - cough, hemoptysis, shortness of breath or wheezing Cardiovascular ROS: negative for - chest pain, dyspnea on exertion, edema or irregular  heartbeat Gastrointestinal ROS: negative for - abdominal pain, diarrhea, hematemesis, nausea/vomiting or stool incontinence Genito-Urinary ROS: negative for - dysuria, hematuria, incontinence or urinary frequency/urgency Musculoskeletal ROS: negative for - joint swelling or muscular weakness Neurological ROS: as noted in HPI Dermatological ROS: negative for rash and skin lesion changes  Neurologic Examination:                                                                                                      Blood pressure 122/65, pulse 82, temperature 97.8 F (36.6 C), temperature source Oral, resp. rate 19, weight 114.76 kg (253 lb), SpO2 97 %.  HEENT-  Normocephalic, no lesions, without obvious abnormality.  Normal external eye and conjunctiva.  Normal TM's bilaterally.  Normal auditory canals and external ears. Normal external nose,  mucus membranes and septum.  Normal pharynx. Cardiovascular- irregularly irregular rhythm, pulses palpable throughout   Lungs- chest clear, no wheezing, rales, normal symmetric air entry Abdomen- normal findings: bowel sounds normal Extremities- positive LE edema Lymph-no adenopathy palpable Musculoskeletal-no joint tenderness, deformity or swelling Skin-warm and dry, no hyperpigmentation, vitiligo, or suspicious lesions  Neurological Examination Mental Status: Alert, oriented to hospital but not date or year.  Speech dysarthric without evidence of aphasia.  Able to follow 3 step commands without difficulty. Cranial Nerves: II: Discs flat bilaterally; Visual fields grossly normal, pupils equal, round, reactive to light and accommodation III,IV, VI: ptosis not present, extra-ocular motions intact bilaterally V,VII: smile symmetric, facial light touch sensation normal bilaterally VIII: hearing normal bilaterally IX,X: uvula rises symmetrically XI: bilateral shoulder shrug XII: midline tongue extension Motor: Right : Upper extremity   5/5    Left:      Upper extremity   5/5  Lower extremity   5/5     Lower extremity   5/5 Tone and bulk:normal tone throughout; no atrophy noted Sensory: Pinprick and light touch intact throughout UE but decreased in LE bilateral legs are wrapped with ACE wraps due to significant edema Deep Tendon Reflexes: 1+ and symmetric throughout UE no KJ or AJ Plantars: Mute bilaterally Cerebellar: normal finger-to-nose, no dysmetria with H-S but limited ROM Gait: not tested       Lab Results: Basic Metabolic Panel:  Recent Labs Lab 08/11/15 1616 08/12/15 0308  NA 136  --   K 4.8  --   CL 98*  --   CO2 27  --   GLUCOSE 221*  --   BUN 41*  --   CREATININE 1.56* 1.36*  CALCIUM 9.0  --     Liver Function Tests: No results for input(s): AST, ALT, ALKPHOS, BILITOT, PROT, ALBUMIN in the last 168 hours. No results for input(s): LIPASE, AMYLASE in the last 168 hours.  Recent Labs Lab 08/12/15 0308  AMMONIA 18    CBC:  Recent Labs Lab 08/11/15 1616 08/12/15 0308  WBC 6.1 9.4  HGB 10.9* 11.2*  HCT 37.1* 37.5*  MCV 101.9* 99.7  PLT 93* 106*    Cardiac Enzymes:  Recent Labs Lab 08/12/15 0308 08/12/15 0726 08/12/15 1255  TROPONINI 0.09* 0.08* 0.07*    Lipid Panel: No results for input(s): CHOL, TRIG, HDL, CHOLHDL, VLDL, LDLCALC in the last 168 hours.  CBG:  Recent Labs Lab 08/11/15 1721 08/12/15 0722 08/12/15 1136  GLUCAP 181* 153* 216*    Microbiology: Results for orders placed or performed during the hospital encounter of 08/11/15  MRSA PCR Screening     Status: None   Collection Time: 08/12/15  2:19 AM  Result Value Ref Range Status   MRSA by PCR NEGATIVE NEGATIVE Final    Comment:        The GeneXpert MRSA Assay (FDA approved for NASAL specimens only), is one component of a comprehensive MRSA colonization surveillance program. It is not intended to diagnose MRSA infection nor to guide or monitor treatment for MRSA infections.     Coagulation Studies: No  results for input(s): LABPROT, INR in the last 72 hours.  Imaging: Dg Chest 2 View  08/11/2015  CLINICAL DATA:  Shortness of breath. Altered mental status. Choked while eating this morning. Possible aspiration. EXAM: CHEST  2 VIEW COMPARISON:  07/04/2015. FINDINGS: Stable enlarged cardiac silhouette and post CABG changes. Interval mild patchy and linear density at the right lung base. Stable prominence of the interstitial markings.  Mild increase in prominence of the pulmonary vasculature. Breathing motion blurring on the lateral view. IMPRESSION: 1. Atelectasis at the right lung base. Superimposed aspiration is unlikely based on the current appearance. 2. Stable cardiomegaly and mild chronic interstitial lung disease with interval mild pulmonary vascular congestion. Electronically Signed   By: Claudie Revering M.D.   On: 08/11/2015 17:07   Ct Head Wo Contrast  08/11/2015  CLINICAL DATA:  Initial encounter for 69 YOM walking using his walker and fell backwards and hit his head today 08/11/15. EXAM: CT HEAD WITHOUT CONTRAST CT CERVICAL SPINE WITHOUT CONTRAST TECHNIQUE: Multidetector CT imaging of the head and cervical spine was performed following the standard protocol without intravenous contrast. Multiplanar CT image reconstructions of the cervical spine were also generated. COMPARISON:  Head CT of 05/26/2015. No prior cervical spine imaging. FINDINGS: CT HEAD FINDINGS Sinuses/Soft tissues: Mild left parietal scalp soft tissue thickening on image/series 67/4. Right maxillary sinus mucous retention cyst or polyp. No skull fracture. Clear mastoid air cells. Intracranial: Cerebral atrophy. Moderate low density in the periventricular white matter likely related to small vessel disease. Left cerebellar remote infarct. No mass lesion, hemorrhage, hydrocephalus, acute infarct, intra-axial, or extra-axial fluid collection. CT CERVICAL SPINE FINDINGS Spinal visualization through the bottom of T4. Prevertebral soft  tissues are within normal limits. Dense carotid atherosclerosis bilaterally. No apical pneumothorax. Advanced spondylosis. Left worse than right neural foraminal narrowing at multiple levels. Mild central canal stenosis, most significant at C3-4. Prior median sternotomy. Maintenance of vertebral body height. Trace C3-4 retrolisthesis. From C7 inferiorly are mildly degraded secondary to patient body habitus. Facets are well-aligned. Coronal reformats demonstrate a normal C1-C2 articulation. IMPRESSION: 1. Mild left parietal scalp soft tissue swelling. No acute intracranial abnormality. 2. Advanced cervical spondylosis. Trace C3-4 is favored to be degenerative. Otherwise, no acute findings in the cervical spine identified. 3.  Cerebral atrophy and small vessel ischemic change. Electronically Signed   By: Abigail Miyamoto M.D.   On: 08/11/2015 16:50   Ct Angio Chest Pe W/cm &/or Wo Cm  08/11/2015  CLINICAL DATA:  Worsening dyspnea.  Altered mental status. EXAM: CT ANGIOGRAPHY CHEST WITH CONTRAST TECHNIQUE: Multidetector CT imaging of the chest was performed using the standard protocol during bolus administration of intravenous contrast. Multiplanar CT image reconstructions and MIPs were obtained to evaluate the vascular anatomy. CONTRAST:  7mL OMNIPAQUE IOHEXOL 350 MG/ML SOLN COMPARISON:  08/28/2014 FINDINGS: Cardiovascular: There is good opacification of the pulmonary arteries. There is no pulmonary embolism. The thoracic aorta is normal in caliber and intact. There is extensive calcified coronary artery plaque. There are multiple coronary artery bypass grafts. Lungs: There are multifocal confluent airspace opacities, central predominant. Considerations include alveolar edema, infectious infiltrate. Hemorrhage or neoplasm less likely but not entirely excluded. Central airways: Patent Effusions: None Lymphadenopathy: Mildly prominent hilar and mediastinal nodes, more likely reactive Esophagus: Unremarkable Upper  abdomen: Unremarkable Musculoskeletal: No significant abnormality except for moderate thoracic degenerative disc disease, kyphosis, and prior sternotomy. Review of the MIP images confirms the above findings. IMPRESSION: 1. Negative for acute pulmonary embolism 2. Confluent airspace opacities bilaterally, right worse than left. Infectious infiltrate is a leading consideration. Alveolar edema less likely but not excluded. There also are mildly prominent hilar and mediastinal nodes which may be reactive. Electronically Signed   By: Andreas Newport M.D.   On: 08/11/2015 23:21   Ct Cervical Spine Wo Contrast  08/11/2015  CLINICAL DATA:  Initial encounter for 69 YOM walking using his walker and fell backwards and  hit his head today 08/11/15. EXAM: CT HEAD WITHOUT CONTRAST CT CERVICAL SPINE WITHOUT CONTRAST TECHNIQUE: Multidetector CT imaging of the head and cervical spine was performed following the standard protocol without intravenous contrast. Multiplanar CT image reconstructions of the cervical spine were also generated. COMPARISON:  Head CT of 05/26/2015. No prior cervical spine imaging. FINDINGS: CT HEAD FINDINGS Sinuses/Soft tissues: Mild left parietal scalp soft tissue thickening on image/series 67/4. Right maxillary sinus mucous retention cyst or polyp. No skull fracture. Clear mastoid air cells. Intracranial: Cerebral atrophy. Moderate low density in the periventricular white matter likely related to small vessel disease. Left cerebellar remote infarct. No mass lesion, hemorrhage, hydrocephalus, acute infarct, intra-axial, or extra-axial fluid collection. CT CERVICAL SPINE FINDINGS Spinal visualization through the bottom of T4. Prevertebral soft tissues are within normal limits. Dense carotid atherosclerosis bilaterally. No apical pneumothorax. Advanced spondylosis. Left worse than right neural foraminal narrowing at multiple levels. Mild central canal stenosis, most significant at C3-4. Prior median  sternotomy. Maintenance of vertebral body height. Trace C3-4 retrolisthesis. From C7 inferiorly are mildly degraded secondary to patient body habitus. Facets are well-aligned. Coronal reformats demonstrate a normal C1-C2 articulation. IMPRESSION: 1. Mild left parietal scalp soft tissue swelling. No acute intracranial abnormality. 2. Advanced cervical spondylosis. Trace C3-4 is favored to be degenerative. Otherwise, no acute findings in the cervical spine identified. 3.  Cerebral atrophy and small vessel ischemic change. Electronically Signed   By: Abigail Miyamoto M.D.   On: 08/11/2015 16:50   Mr Brain Wo Contrast  08/12/2015  CLINICAL DATA:  Initial evaluation for syncope. EXAM: MRI HEAD WITHOUT CONTRAST TECHNIQUE: Multiplanar, multiecho pulse sequences of the brain and surrounding structures were obtained without intravenous contrast. COMPARISON:  Prior CT from on 08/11/2015. FINDINGS: Diffuse prominence of the CSF containing spaces is compatible with generalized age-related cerebral atrophy. Patchy T2/FLAIR hyperintensity within the periventricular and deep white matter both cerebral hemispheres most likely related chronic small vessel ischemic disease. Encephalomalacia within the left cerebellar hemisphere consistent with remote infarct. Associated chronic hemosiderin staining present within this region. Probable remote infarct within the anteromedial left frontal lobe as well. There is a possible tiny punctate 4 mm focus of high signal intensity on DWI sequence involving the cortical gray matter of the posterior right frontal region (series 3, image 31 on axial sequence, series 5, image 14 on coronal sequence). Finding may reflect a tiny acute ischemic infarct. Corresponding signal loss not definitely seen on ADC map, although this is felt to be too small to be resolved. No other definite acute intracranial infarct. Abnormal flow void within the distal left vertebral artery, which may related to focal plaque or  possibly chronic dissection (series 8, image 3), similar to prior. Major intracranial vascular flow voids otherwise maintained. The right vertebral artery is diminutive. No acute intracranial hemorrhage. No mass lesion, midline shift, or mass effect. No hydrocephalus. No extra-axial fluid collection. Craniocervical junction grossly within normal limits. Degenerative spondylolysis noted within the partially visualized upper cervical spine. Pituitary gland grossly normal. No acute abnormality about the orbits. Sequela prior lens extraction present on the left. Mild mucosal thickening within the maxillary sinuses and ethmoidal air cells. Paranasal sinuses are otherwise clear. Minimal opacity within the right mastoid air cells. Inner ear structures normal. Bone marrow signal intensity within normal limits. No scalp soft tissue abnormality. IMPRESSION: 1. Question 4 mm punctate focus of high signal intensity on DWI sequence within the posterior right frontal lobe. Finding favored to reflect artifact, although possible tiny acute ischemic  infarct is not entirely excluded, and could be considered in the correct clinical setting. 2. No other acute intracranial process. 3. 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 4. Abnormal flow void within the distal left vertebral artery, which may be related to focal plaque or possibly chronic dissection. This is similar relative to prior study. 5. Age-related cerebral atrophy with mild to moderate chronic small vessel ischemic disease. Electronically Signed   By: Jeannine Boga M.D.   On: 08/12/2015 02:40   Dg Abd Acute W/chest  08/12/2015  CLINICAL DATA:  Episode of nausea and vomiting earlier, no abdominal pain, history of bowel resection and breast malignancy. EXAM: DG ABDOMEN ACUTE W/ 1V CHEST COMPARISON:  Portable chest x-ray of August 11, 2015 and chest CT scan of the same day FINDINGS: The lungs are borderline hypoinflated.  Confluent alveolar opacities are present at both lung bases greater on the right than on the left. A trace of pleural fluid on the right is suspected. The cardiac silhouette is mildly enlarged. The central pulmonary vascularity is engorged and indistinct. The patient has undergone previous CABG. The stool burden within the colon is mildly increased. There is a moderate amount of gas within loops of the sigmoid colon. There is no small or large bowel obstructive pattern and no evidence of perforation. There is degenerative change of the lower lumbar spine. There is contrast within the urinary bladder from the earlier CT scan. There is splenic arterial calcification. IMPRESSION: 1. Patchy airspace opacities bilaterally consistent with pneumonia. A small amount of pleural fluid is likely present on the right. 2. CHF with mild pulmonary vascular congestion. 3. No acute intra-abdominal abnormality is observed. Increased colonic stool burden may reflect constipation in the appropriate clinical setting. Electronically Signed   By: David  Martinique M.D.   On: 08/12/2015 08:16       Assessment and plan discussed with with attending physician and they are in agreement.    Etta Quill PA-C [facility] [phone]  08/12/2015, 3:41 PM   Assessment: 69 y.o. male with incidental right punctate infarct on MRI. Given it's presence on two sequences, I do suspect that it is real, not artifactual, but in any case given his history of afib and other strokes, would favor stroke prevention with anticoagulation.   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 I do not see a documented INR so I would check this prior to starting it.   Stroke Risk Factors - atrial fibrillation, carotid stenosis, diabetes mellitus, family history, hyperlipidemia and hypertension  1. HgbA1c, fasting lipid panel 2. Frequent neuro checks 3. Echocardiogram 4. Carotid  dopplers 5. Prophylactic therapy-Antiplatelet med: Aspirin - dose 325mg  PO or 300mg  PR 6. Risk factor modification 7. Will check INR and consider anticoagulation if he does nto have coagulopathy.   Roland Rack, MD [facility] [phone]  If 7pm- 7am, please page neurology on call as listed in [place].

## 2015-08-12 NOTE — Progress Notes (Signed)
Paged on call NP with Troponin result of 0.09. Patient with no complaints resting comfortably in bed. Will continue to monitor.

## 2015-08-13 ENCOUNTER — Inpatient Hospital Stay (HOSPITAL_COMMUNITY): Payer: Medicare Other

## 2015-08-13 DIAGNOSIS — I2510 Atherosclerotic heart disease of native coronary artery without angina pectoris: Secondary | ICD-10-CM

## 2015-08-13 DIAGNOSIS — I482 Chronic atrial fibrillation: Secondary | ICD-10-CM

## 2015-08-13 DIAGNOSIS — I63031 Cerebral infarction due to thrombosis of right carotid artery: Secondary | ICD-10-CM

## 2015-08-13 DIAGNOSIS — I639 Cerebral infarction, unspecified: Secondary | ICD-10-CM | POA: Insufficient documentation

## 2015-08-13 LAB — CBC
HEMATOCRIT: 33.1 % — AB (ref 39.0–52.0)
HEMOGLOBIN: 10.1 g/dL — AB (ref 13.0–17.0)
MCH: 30.7 pg (ref 26.0–34.0)
MCHC: 30.5 g/dL (ref 30.0–36.0)
MCV: 100.6 fL — AB (ref 78.0–100.0)
Platelets: 92 10*3/uL — ABNORMAL LOW (ref 150–400)
RBC: 3.29 MIL/uL — ABNORMAL LOW (ref 4.22–5.81)
RDW: 16.2 % — AB (ref 11.5–15.5)
WBC: 5 10*3/uL (ref 4.0–10.5)

## 2015-08-13 LAB — BASIC METABOLIC PANEL
Anion gap: 6 (ref 5–15)
BUN: 39 mg/dL — AB (ref 6–20)
CALCIUM: 9 mg/dL (ref 8.9–10.3)
CHLORIDE: 95 mmol/L — AB (ref 101–111)
CO2: 34 mmol/L — AB (ref 22–32)
CREATININE: 1.67 mg/dL — AB (ref 0.61–1.24)
GFR calc Af Amer: 46 mL/min — ABNORMAL LOW (ref >60)
GFR calc non Af Amer: 40 mL/min — ABNORMAL LOW (ref >60)
Glucose, Bld: 155 mg/dL — ABNORMAL HIGH (ref 65–99)
Potassium: 3.9 mmol/L (ref 3.5–5.1)
Sodium: 135 mmol/L (ref 135–145)

## 2015-08-13 LAB — GLUCOSE, CAPILLARY
GLUCOSE-CAPILLARY: 194 mg/dL — AB (ref 65–99)
Glucose-Capillary: 158 mg/dL — ABNORMAL HIGH (ref 65–99)
Glucose-Capillary: 186 mg/dL — ABNORMAL HIGH (ref 65–99)
Glucose-Capillary: 202 mg/dL — ABNORMAL HIGH (ref 65–99)

## 2015-08-13 MED ORDER — IPRATROPIUM-ALBUTEROL 0.5-2.5 (3) MG/3ML IN SOLN
3.0000 mL | Freq: Three times a day (TID) | RESPIRATORY_TRACT | Status: DC
  Administered 2015-08-13 – 2015-08-17 (×12): 3 mL via RESPIRATORY_TRACT
  Filled 2015-08-13 (×13): qty 3

## 2015-08-13 MED ORDER — ALBUTEROL SULFATE (2.5 MG/3ML) 0.083% IN NEBU: 2.5000 mg | INHALATION_SOLUTION | RESPIRATORY_TRACT | Status: DC | PRN

## 2015-08-13 MED ORDER — FUROSEMIDE 10 MG/ML IJ SOLN: 40.0000 mg | Freq: Two times a day (BID) | INTRAMUSCULAR | Status: DC

## 2015-08-13 NOTE — Progress Notes (Signed)
Pt/ and daughter is requesting to call her daughter daily for updates. Her number  Is [phone].

## 2015-08-13 NOTE — Progress Notes (Addendum)
PATIENT DETAILS Name: QUANTAE MARTEL Age: 69 y.o. Sex: male Date of Birth: Dec 07, 1945 Admit Date: 08/11/2015 Admitting Physician Rise Patience, MD OTL:XBWIOM,BTDHR, MD  Subjective: Remains mildly confused-denies any worsening shortness of breath. No chest pain.  Assessment/Plan: Principal Problem: Acute respiratory failure with hypoxia: Suspect multifactorial-from probable aspiration pneumonia,? Acute diastolic heart failure. Continue empiric antibiotics, continue Lasix but decrease dose. Titrate off oxygen as tolerated.  Active Problems: ?Syncope/Pre syncope: In a setting of vomiting-? Vagal. Telemetry-shows A. fib-but no other arrhythmias. Recent echocardiogram on 06/12/15 shows EF around 60-65%. Continue to monitor in telemetry. Check orthostatics/EEG  Probable aspiration pneumonia: Continue empiric vancomycin and Zosyn, SLP evaluation. Blood cultures negative so far, urine streptococcal antigen negative  Acute diastolic heart failure: Dry weight appears to be around 244 pounds-current weight: 255 pounds-given elevated creatinine-decrease Lasix to 40 mg twice a day. Follow.  Acute encephalopathy:Remains mildly confused-not sure what the etiology is-could be from pneumonia.? Underlying dementia  CVA: Suspect more of an incidental finding. Has a history of atrial fibrillation-considered a poor candidate for long-term anticoagulation given history of alcohol use.CHA2DS2-VASc score 6-await neurology follow-up regarding initiating anticoagulation. We need to discuss risk/benefits with patient and family. Carotid Doppler, A1c and lipid panel are pending. In the meantime continue with aspirin.  Anemia/thrombocytopenia: Chronic issue, stable for outpatient work up  Minimally elevated troponins: May be demand ischemia or false positive elevation in the setting of chronic kidney disease. No workup required, continue with aspirin, statin and beta blockers  Atrial  fibrillation: See above regarding anticoagulation-rate controlled with Coreg.  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 Chronic small ulceration in the anterior abdominal wall that appears stable  History of right breast cancer: Status post mastectomy, continue tamoxifen.Follow with Dr Lindi Adie  Acute on CKD stage 3: creatinine slightly elevated than his usual baseline. CKD likely secondary to underlying diabetic nephropathy-ARF likely secondary to acute illness/prerenal azotemia. Follow  Essential hypertension:controlled, continue Coreg, and Lasix. Follow BP   Type 2 diabetes: CBGs stable-await A1c-currently on SSI-previously was on oral agents that was discontinued in the past because of hypoglycemia  Hypothyroidism: Continue with Synthroid  COPD: Continue with bronchodilators-this appears stable  Gout: Continue colchicine-appears stable  CAD status post CABG: Continue aspirin, statins and beta blockers  OSA: Related to obesity-continue CPAP  Deconditioning: Suspect underlying failure to thrive syndrome-Will likely require SNF on discharge   Disposition: Remain inpatient-require several more days of hospitalization prior to discharge  Antimicrobial agents  See below  Anti-infectives    Start     Dose/Rate Route Frequency Ordered Stop   08/12/15 1000  vancomycin (VANCOCIN) IVPB 750 mg/150 ml premix     750 mg 150 mL/hr over 60 Minutes Intravenous Every 12 hours 08/12/15 0215     08/12/15 0230  vancomycin (VANCOCIN) 1,250 mg in sodium chloride 0.9 % 250 mL IVPB     1,250 mg 166.7 mL/hr over 90 Minutes Intravenous  Once 08/12/15 0215 08/12/15 0542   08/12/15 0230  piperacillin-tazobactam (ZOSYN) IVPB 3.375 g     3.375 g 12.5 mL/hr over 240 Minutes Intravenous 3 times per day 08/12/15 0215        DVT Prophylaxis: Prophylactic Lovenox   Code Status: Full code    Family Communication Daughter over the phone  Procedures: None  CONSULTS:  cardiology and neurology  Time spent 40  minutes-Greater than 50% of this time was spent in counseling, explanation of diagnosis, planning of further management, and coordination of care.  MEDICATIONS: Scheduled Meds: . allopurinol  300 mg Oral Daily  . aspirin EC  325 mg Oral Daily  . atorvastatin  20 mg Oral Daily  . budesonide (PULMICORT) nebulizer solution  0.25 mg Nebulization BID  . carvedilol  6.25 mg Oral BID WC  . colchicine  0.6 mg Oral Daily  . enoxaparin (LOVENOX) injection  40 mg Subcutaneous Q24H  . FLUoxetine  20 mg Oral Daily  . folic acid  1 mg Oral Daily  . furosemide  60 mg Intravenous BID  . gabapentin  300 mg Oral BID  . insulin aspart  0-9 Units Subcutaneous TID WC  . ipratropium-albuterol  3 mL Nebulization TID  . levothyroxine  187 mcg Oral QAC breakfast  . magnesium oxide  400 mg Oral BID  . pantoprazole  40 mg Oral Daily  . piperacillin-tazobactam (ZOSYN)  IV  3.375 g Intravenous 3 times per day  . potassium chloride  20 mEq Oral QHS  . potassium chloride SA  40 mEq Oral Daily  . tamoxifen  20 mg Oral QHS  . vancomycin  750 mg Intravenous Q12H  . venlafaxine XR  37.5 mg Oral Daily   Continuous Infusions:  PRN Meds:.albuterol, oxyCODONE, senna-docusate    PHYSICAL EXAM: Vital signs in last 24 hours: Filed Vitals:   08/13/15 0734 08/13/15 0806 08/13/15 0828 08/13/15 1200  BP: 128/76  130/80 115/66  Pulse: 94  84 71  Temp:   98.2 F (36.8 C) 98.4 F (36.9 C)  TempSrc:   Oral Oral  Resp: 20   18  Weight:      SpO2: 96% 98%  97%    Weight change: 1.179 kg (2 lb 9.6 oz) Filed Weights   08/12/15 0143 08/13/15 0400  Weight: 114.76 kg (253 lb) 115.939 kg (255 lb 9.6 oz)   Body mass index is 38.87 kg/(m^2).   Gen Exam: Pleasantly confused-speech clear Neck: Supple, No JVD.   Chest: B/L Clear.   CVS: S1 S2 irregular, no murmurs.  Abdomen: soft, BS +, non  tender, non distended.  Extremities: Unna boots in place Neurologic: Non Focal-but with generalized weakness  Skin: No Rash.  Wounds: N/A.   Intake/Output from previous day:  Intake/Output Summary (Last 24 hours) at 08/13/15 1421 Last data filed at 08/13/15 0900  Gross per 24 hour  Intake    240 ml  Output    800 ml  Net   -560 ml     LAB RESULTS: CBC  Recent Labs Lab 08/11/15 1616 08/12/15 0308 08/13/15 0745  WBC 6.1 9.4 5.0  HGB 10.9* 11.2* 10.1*  HCT 37.1* 37.5* 33.1*  PLT 93* 106* 92*  MCV 101.9* 99.7 100.6*  MCH 29.9 29.8 30.7  MCHC 29.4* 29.9* 30.5  RDW 16.1* 16.1* 16.2*    Chemistries   Recent Labs Lab 08/11/15 1616 08/12/15 0308 08/13/15 0745  NA 136  --  135  K 4.8  --  3.9  CL 98*  --  95*  CO2 27  --  34*  GLUCOSE 221*  --  155*  BUN 41*  --  39*  CREATININE 1.56* 1.36* 1.67*  CALCIUM 9.0  --  9.0    CBG:  Recent Labs Lab 08/12/15 1136 08/12/15 1641 08/12/15 2232 08/13/15 0737 08/13/15 1134  GLUCAP 216* 166* 174* 158* 186*    GFR Estimated Creatinine Clearance: 50.2 mL/min (  by C-G formula based on Cr of 1.67).  Coagulation profile  Recent Labs Lab 08/12/15 1555  INR 1.43    Cardiac Enzymes  Recent Labs Lab 08/12/15 0308 08/12/15 0726 08/12/15 1255  TROPONINI 0.09* 0.08* 0.07*    Invalid input(s): POCBNP No results for input(s): DDIMER in the last 72 hours. No results for input(s): HGBA1C in the last 72 hours. No results for input(s): CHOL, HDL, LDLCALC, TRIG, CHOLHDL, LDLDIRECT in the last 72 hours.  Recent Labs  08/12/15 0308  TSH 0.976   No results for input(s): VITAMINB12, FOLATE, FERRITIN, TIBC, IRON, RETICCTPCT in the last 72 hours. No results for input(s): LIPASE, AMYLASE in the last 72 hours.  Urine Studies No results for input(s): UHGB, CRYS in the last 72 hours.  Invalid input(s): UACOL, UAPR, USPG, UPH, UTP, UGL, UKET, UBIL, UNIT, UROB, ULEU, UEPI, UWBC, URBC, UBAC, CAST, UCOM,  BILUA  MICROBIOLOGY: Recent Results (from the past 240 hour(s))  MRSA PCR Screening     Status: None   Collection Time: 08/12/15  2:19 AM  Result Value Ref Range Status   MRSA by PCR NEGATIVE NEGATIVE Final    Comment:        The GeneXpert MRSA Assay (FDA approved for NASAL specimens only), is one component of a comprehensive MRSA colonization surveillance program. It is not intended to diagnose MRSA infection nor to guide or monitor treatment for MRSA infections.   Culture, blood (routine x 2) Call MD if unable to obtain prior to antibiotics being given     Status: None (Preliminary result)   Collection Time: 08/12/15  3:00 AM  Result Value Ref Range Status   Specimen Description BLOOD LEFT HAND  Final   Special Requests BOTTLES DRAWN AEROBIC AND ANAEROBIC 5CC  Final   Culture NO GROWTH 1 DAY  Final   Report Status PENDING  Incomplete  Culture, blood (routine x 2) Call MD if unable to obtain prior to antibiotics being given     Status: None (Preliminary result)   Collection Time: 08/12/15  3:08 AM  Result Value Ref Range Status   Specimen Description BLOOD RIGHT HAND  Final   Special Requests IN PEDIATRIC BOTTLE 3CC  Final   Culture NO GROWTH 1 DAY  Final   Report Status PENDING  Incomplete    RADIOLOGY STUDIES/RESULTS: Dg Chest 2 View  08/11/2015  CLINICAL DATA:  Shortness of breath. Altered mental status. Choked while eating this morning. Possible aspiration. EXAM: CHEST  2 VIEW COMPARISON:  07/04/2015. FINDINGS: Stable enlarged cardiac silhouette and post CABG changes. Interval mild patchy and linear density at the right lung base. Stable prominence of the interstitial markings. Mild increase in prominence of the pulmonary vasculature. Breathing motion blurring on the lateral view. IMPRESSION: 1. Atelectasis at the right lung base. Superimposed aspiration is unlikely based on the current appearance. 2. Stable cardiomegaly and mild chronic interstitial lung disease with  interval mild pulmonary vascular congestion. Electronically Signed   By: Claudie Revering M.D.   On: 08/11/2015 17:07   Ct Head Wo Contrast  08/11/2015  CLINICAL DATA:  Initial encounter for 69 YOM walking using his walker and fell backwards and hit his head today 08/11/15. EXAM: CT HEAD WITHOUT CONTRAST CT CERVICAL SPINE WITHOUT CONTRAST TECHNIQUE: Multidetector CT imaging of the head and cervical spine was performed following the standard protocol without intravenous contrast. Multiplanar CT image reconstructions of the cervical spine were also generated. COMPARISON:  Head CT of 05/26/2015. No prior cervical spine imaging. FINDINGS:  CT HEAD FINDINGS Sinuses/Soft tissues: Mild left parietal scalp soft tissue thickening on image/series 67/4. Right maxillary sinus mucous retention cyst or polyp. No skull fracture. Clear mastoid air cells. Intracranial: Cerebral atrophy. Moderate low density in the periventricular white matter likely related to small vessel disease. Left cerebellar remote infarct. No mass lesion, hemorrhage, hydrocephalus, acute infarct, intra-axial, or extra-axial fluid collection. CT CERVICAL SPINE FINDINGS Spinal visualization through the bottom of T4. Prevertebral soft tissues are within normal limits. Dense carotid atherosclerosis bilaterally. No apical pneumothorax. Advanced spondylosis. Left worse than right neural foraminal narrowing at multiple levels. Mild central canal stenosis, most significant at C3-4. Prior median sternotomy. Maintenance of vertebral body height. Trace C3-4 retrolisthesis. From C7 inferiorly are mildly degraded secondary to patient body habitus. Facets are well-aligned. Coronal reformats demonstrate a normal C1-C2 articulation. IMPRESSION: 1. Mild left parietal scalp soft tissue swelling. No acute intracranial abnormality. 2. Advanced cervical spondylosis. Trace C3-4 is favored to be degenerative. Otherwise, no acute findings in the cervical spine identified. 3.   Cerebral atrophy and small vessel ischemic change. Electronically Signed   By: Abigail Miyamoto M.D.   On: 08/11/2015 16:50   Ct Angio Chest Pe W/cm &/or Wo Cm  08/11/2015  CLINICAL DATA:  Worsening dyspnea.  Altered mental status. EXAM: CT ANGIOGRAPHY CHEST WITH CONTRAST TECHNIQUE: Multidetector CT imaging of the chest was performed using the standard protocol during bolus administration of intravenous contrast. Multiplanar CT image reconstructions and MIPs were obtained to evaluate the vascular anatomy. CONTRAST:  45mL OMNIPAQUE IOHEXOL 350 MG/ML SOLN COMPARISON:  08/28/2014 FINDINGS: Cardiovascular: There is good opacification of the pulmonary arteries. There is no pulmonary embolism. The thoracic aorta is normal in caliber and intact. There is extensive calcified coronary artery plaque. There are multiple coronary artery bypass grafts. Lungs: There are multifocal confluent airspace opacities, central predominant. Considerations include alveolar edema, infectious infiltrate. Hemorrhage or neoplasm less likely but not entirely excluded. Central airways: Patent Effusions: None Lymphadenopathy: Mildly prominent hilar and mediastinal nodes, more likely reactive Esophagus: Unremarkable Upper abdomen: Unremarkable Musculoskeletal: No significant abnormality except for moderate thoracic degenerative disc disease, kyphosis, and prior sternotomy. Review of the MIP images confirms the above findings. IMPRESSION: 1. Negative for acute pulmonary embolism 2. Confluent airspace opacities bilaterally, right worse than left. Infectious infiltrate is a leading consideration. Alveolar edema less likely but not excluded. There also are mildly prominent hilar and mediastinal nodes which may be reactive. Electronically Signed   By: Andreas Newport M.D.   On: 08/11/2015 23:21   Ct Cervical Spine Wo Contrast  08/11/2015  CLINICAL DATA:  Initial encounter for 69 YOM walking using his walker and fell backwards and hit his head  today 08/11/15. EXAM: CT HEAD WITHOUT CONTRAST CT CERVICAL SPINE WITHOUT CONTRAST TECHNIQUE: Multidetector CT imaging of the head and cervical spine was performed following the standard protocol without intravenous contrast. Multiplanar CT image reconstructions of the cervical spine were also generated. COMPARISON:  Head CT of 05/26/2015. No prior cervical spine imaging. FINDINGS: CT HEAD FINDINGS Sinuses/Soft tissues: Mild left parietal scalp soft tissue thickening on image/series 67/4. Right maxillary sinus mucous retention cyst or polyp. No skull fracture. Clear mastoid air cells. Intracranial: Cerebral atrophy. Moderate low density in the periventricular white matter likely related to small vessel disease. Left cerebellar remote infarct. No mass lesion, hemorrhage, hydrocephalus, acute infarct, intra-axial, or extra-axial fluid collection. CT CERVICAL SPINE FINDINGS Spinal visualization through the bottom of T4. Prevertebral soft tissues are within normal limits. Dense carotid atherosclerosis bilaterally. No  apical pneumothorax. Advanced spondylosis. Left worse than right neural foraminal narrowing at multiple levels. Mild central canal stenosis, most significant at C3-4. Prior median sternotomy. Maintenance of vertebral body height. Trace C3-4 retrolisthesis. From C7 inferiorly are mildly degraded secondary to patient body habitus. Facets are well-aligned. Coronal reformats demonstrate a normal C1-C2 articulation. IMPRESSION: 1. Mild left parietal scalp soft tissue swelling. No acute intracranial abnormality. 2. Advanced cervical spondylosis. Trace C3-4 is favored to be degenerative. Otherwise, no acute findings in the cervical spine identified. 3.  Cerebral atrophy and small vessel ischemic change. Electronically Signed   By: Abigail Miyamoto M.D.   On: 08/11/2015 16:50   Mr Brain Wo Contrast  08/12/2015  CLINICAL DATA:  Initial evaluation for syncope. EXAM: MRI HEAD WITHOUT CONTRAST TECHNIQUE: Multiplanar,  multiecho pulse sequences of the brain and surrounding structures were obtained without intravenous contrast. COMPARISON:  Prior CT from on 08/11/2015. FINDINGS: Diffuse prominence of the CSF containing spaces is compatible with generalized age-related cerebral atrophy. Patchy T2/FLAIR hyperintensity within the periventricular and deep white matter both cerebral hemispheres most likely related chronic small vessel ischemic disease. Encephalomalacia within the left cerebellar hemisphere consistent with remote infarct. Associated chronic hemosiderin staining present within this region. Probable remote infarct within the anteromedial left frontal lobe as well. There is a possible tiny punctate 4 mm focus of high signal intensity on DWI sequence involving the cortical gray matter of the posterior right frontal region (series 3, image 31 on axial sequence, series 5, image 14 on coronal sequence). Finding may reflect a tiny acute ischemic infarct. Corresponding signal loss not definitely seen on ADC map, although this is felt to be too small to be resolved. No other definite acute intracranial infarct. Abnormal flow void within the distal left vertebral artery, which may related to focal plaque or possibly chronic dissection (series 8, image 3), similar to prior. Major intracranial vascular flow voids otherwise maintained. The right vertebral artery is diminutive. No acute intracranial hemorrhage. No mass lesion, midline shift, or mass effect. No hydrocephalus. No extra-axial fluid collection. Craniocervical junction grossly within normal limits. Degenerative spondylolysis noted within the partially visualized upper cervical spine. Pituitary gland grossly normal. No acute abnormality about the orbits. Sequela prior lens extraction present on the left. Mild mucosal thickening within the maxillary sinuses and ethmoidal air cells. Paranasal sinuses are otherwise clear. Minimal opacity within the right mastoid air cells.  Inner ear structures normal. Bone marrow signal intensity within normal limits. No scalp soft tissue abnormality. IMPRESSION: 1. Question 4 mm punctate focus of high signal intensity on DWI sequence within the posterior right frontal lobe. Finding favored to reflect artifact, although possible tiny acute ischemic infarct is not entirely excluded, and could be considered in the correct clinical setting. 2. No other acute intracranial process. 3. 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 4. Abnormal flow void within the distal left vertebral artery, which may be related to focal plaque or possibly chronic dissection. This is similar relative to prior study. 5. Age-related cerebral atrophy with mild to moderate chronic small vessel ischemic disease. Electronically Signed   By: Jeannine Boga M.D.   On: 08/12/2015 02:40   Dg Abd Acute W/chest  08/12/2015  CLINICAL DATA:  Episode of nausea and vomiting earlier, no abdominal pain, history of bowel resection and breast malignancy. EXAM: DG ABDOMEN ACUTE W/ 1V CHEST COMPARISON:  Portable chest x-ray of August 11, 2015 and chest CT scan of the same day FINDINGS: The lungs are  borderline hypoinflated. Confluent alveolar opacities are present at both lung bases greater on the right than on the left. A trace of pleural fluid on the right is suspected. The cardiac silhouette is mildly enlarged. The central pulmonary vascularity is engorged and indistinct. The patient has undergone previous CABG. The stool burden within the colon is mildly increased. There is a moderate amount of gas within loops of the sigmoid colon. There is no small or large bowel obstructive pattern and no evidence of perforation. There is degenerative change of the lower lumbar spine. There is contrast within the urinary bladder from the earlier CT scan. There is splenic arterial calcification. IMPRESSION: 1. Patchy airspace opacities bilaterally  consistent with pneumonia. A small amount of pleural fluid is likely present on the right. 2. CHF with mild pulmonary vascular congestion. 3. No acute intra-abdominal abnormality is observed. Increased colonic stool burden may reflect constipation in the appropriate clinical setting. Electronically Signed   By: David  Martinique M.D.   On: 08/12/2015 08:16    Oren Binet, MD  [facility] Pager:[phone]  If 7PM-7AM, please contact night-coverage [email or web address] Password [email or web address] 08/13/2015, 2:21 PM   LOS: 2 days

## 2015-08-13 NOTE — Progress Notes (Signed)
Routine EEG given, results pending.

## 2015-08-13 NOTE — Progress Notes (Signed)
STROKE TEAM PROGRESS NOTE   HISTORY [name] is a 69 y.o. male w/ PMHx of CHF, CKD, previous h/o CVA, Gout, EtOH abuse, and OSA, presents to the ED after having multiple falls at home. While in the ED, patient found to be hypoxic, CTA chest significant for pneumonia. Also found to be confused in the ED, MRI brain performed, showed punctate infarct in the right frontal lobe. Patient w/ known h/o PAF not on anticoagulation 2/2 prior EtOH abuse.    Patient was not administered TPA as he was asymptomatic. He was admitted to telemetry for further evaluation and treatment.   SUBJECTIVE (INTERVAL HISTORY) Patient w/ mild SOB, otherwise no neurologic complaints. No confusion, mental status seems to be at baseline.    OBJECTIVE Temp:  [97.6 F (36.4 C)-98.4 F (36.9 C)] 98.4 F (36.9 C) (11/02 1200) Pulse Rate:  [71-94] 71 (11/02 1200) Cardiac Rhythm:  [-] Atrial fibrillation (11/02 0700) Resp:  [18-20] 18 (11/02 1200) BP: (115-146)/(51-80) 115/66 mmHg (11/02 1200) SpO2:  [96 %-100 %] 97 % (11/02 1200) Weight:  [255 lb 9.6 oz (115.939 kg)] 255 lb 9.6 oz (115.939 kg) (11/02 0400)  CBC:  Recent Labs Lab 08/12/15 0308 08/13/15 0745  WBC 9.4 5.0  HGB 11.2* 10.1*  HCT 37.5* 33.1*  MCV 99.7 100.6*  PLT 106* 92*    Basic Metabolic Panel:  Recent Labs Lab 08/11/15 1616 08/12/15 0308 08/13/15 0745  NA 136  --  135  K 4.8  --  3.9  CL 98*  --  95*  CO2 27  --  34*  GLUCOSE 221*  --  155*  BUN 41*  --  39*  CREATININE 1.56* 1.36* 1.67*  CALCIUM 9.0  --  9.0    Lipid Panel:    Component Value Date/Time   CHOL 100 08/02/2014 0524   TRIG 88 08/02/2014 0524   HDL 34* 08/02/2014 0524   CHOLHDL 2.9 08/02/2014 0524   VLDL 18 08/02/2014 0524   LDLCALC 48 08/02/2014 0524   HgbA1c:  Lab Results  Component Value Date   HGBA1C 8.0* 06/10/2015   Urine Drug Screen:    Component Value Date/Time   LABOPIA POSITIVE* 07/21/2014 2152   COCAINSCRNUR NONE DETECTED 07/21/2014  2152   LABBENZ NONE DETECTED 07/21/2014 2152   AMPHETMU NONE DETECTED 07/21/2014 2152   THCU NONE DETECTED 07/21/2014 2152   LABBARB NONE DETECTED 07/21/2014 2152      IMAGING  Dg Chest 2 View  08/11/2015  CLINICAL DATA:  Shortness of breath. Altered mental status. Choked while eating this morning. Possible aspiration. EXAM: CHEST  2 VIEW COMPARISON:  07/04/2015. FINDINGS: Stable enlarged cardiac silhouette and post CABG changes. Interval mild patchy and linear density at the right lung base. Stable prominence of the interstitial markings. Mild increase in prominence of the pulmonary vasculature. Breathing motion blurring on the lateral view. IMPRESSION: 1. Atelectasis at the right lung base. Superimposed aspiration is unlikely based on the current appearance. 2. Stable cardiomegaly and mild chronic interstitial lung disease with interval mild pulmonary vascular congestion. Electronically Signed   By: Claudie Revering M.D.   On: 08/11/2015 17:07   Ct Head Wo Contrast  08/11/2015  CLINICAL DATA:  Initial encounter for 69 YOM walking using his walker and fell backwards and hit his head today 08/11/15. EXAM: CT HEAD WITHOUT CONTRAST CT CERVICAL SPINE WITHOUT CONTRAST TECHNIQUE: Multidetector CT imaging of the head and cervical spine was performed following the standard protocol without intravenous contrast. Multiplanar CT  image reconstructions of the cervical spine were also generated. COMPARISON:  Head CT of 05/26/2015. No prior cervical spine imaging. FINDINGS: CT HEAD FINDINGS Sinuses/Soft tissues: Mild left parietal scalp soft tissue thickening on image/series 67/4. Right maxillary sinus mucous retention cyst or polyp. No skull fracture. Clear mastoid air cells. Intracranial: Cerebral atrophy. Moderate low density in the periventricular white matter likely related to small vessel disease. Left cerebellar remote infarct. No mass lesion, hemorrhage, hydrocephalus, acute infarct, intra-axial, or  extra-axial fluid collection. CT CERVICAL SPINE FINDINGS Spinal visualization through the bottom of T4. Prevertebral soft tissues are within normal limits. Dense carotid atherosclerosis bilaterally. No apical pneumothorax. Advanced spondylosis. Left worse than right neural foraminal narrowing at multiple levels. Mild central canal stenosis, most significant at C3-4. Prior median sternotomy. Maintenance of vertebral body height. Trace C3-4 retrolisthesis. From C7 inferiorly are mildly degraded secondary to patient body habitus. Facets are well-aligned. Coronal reformats demonstrate a normal C1-C2 articulation. IMPRESSION: 1. Mild left parietal scalp soft tissue swelling. No acute intracranial abnormality. 2. Advanced cervical spondylosis. Trace C3-4 is favored to be degenerative. Otherwise, no acute findings in the cervical spine identified. 3.  Cerebral atrophy and small vessel ischemic change. Electronically Signed   By: Abigail Miyamoto M.D.   On: 08/11/2015 16:50   Ct Angio Chest Pe W/cm &/or Wo Cm  08/11/2015  CLINICAL DATA:  Worsening dyspnea.  Altered mental status. EXAM: CT ANGIOGRAPHY CHEST WITH CONTRAST TECHNIQUE: Multidetector CT imaging of the chest was performed using the standard protocol during bolus administration of intravenous contrast. Multiplanar CT image reconstructions and MIPs were obtained to evaluate the vascular anatomy. CONTRAST:  78mL OMNIPAQUE IOHEXOL 350 MG/ML SOLN COMPARISON:  08/28/2014 FINDINGS: Cardiovascular: There is good opacification of the pulmonary arteries. There is no pulmonary embolism. The thoracic aorta is normal in caliber and intact. There is extensive calcified coronary artery plaque. There are multiple coronary artery bypass grafts. Lungs: There are multifocal confluent airspace opacities, central predominant. Considerations include alveolar edema, infectious infiltrate. Hemorrhage or neoplasm less likely but not entirely excluded. Central airways: Patent Effusions:  None Lymphadenopathy: Mildly prominent hilar and mediastinal nodes, more likely reactive Esophagus: Unremarkable Upper abdomen: Unremarkable Musculoskeletal: No significant abnormality except for moderate thoracic degenerative disc disease, kyphosis, and prior sternotomy. Review of the MIP images confirms the above findings. IMPRESSION: 1. Negative for acute pulmonary embolism 2. Confluent airspace opacities bilaterally, right worse than left. Infectious infiltrate is a leading consideration. Alveolar edema less likely but not excluded. There also are mildly prominent hilar and mediastinal nodes which may be reactive. Electronically Signed   By: Andreas Newport M.D.   On: 08/11/2015 23:21   Ct Cervical Spine Wo Contrast  08/11/2015  CLINICAL DATA:  Initial encounter for 69 YOM walking using his walker and fell backwards and hit his head today 08/11/15. EXAM: CT HEAD WITHOUT CONTRAST CT CERVICAL SPINE WITHOUT CONTRAST TECHNIQUE: Multidetector CT imaging of the head and cervical spine was performed following the standard protocol without intravenous contrast. Multiplanar CT image reconstructions of the cervical spine were also generated. COMPARISON:  Head CT of 05/26/2015. No prior cervical spine imaging. FINDINGS: CT HEAD FINDINGS Sinuses/Soft tissues: Mild left parietal scalp soft tissue thickening on image/series 67/4. Right maxillary sinus mucous retention cyst or polyp. No skull fracture. Clear mastoid air cells. Intracranial: Cerebral atrophy. Moderate low density in the periventricular white matter likely related to small vessel disease. Left cerebellar remote infarct. No mass lesion, hemorrhage, hydrocephalus, acute infarct, intra-axial, or extra-axial fluid collection. CT CERVICAL  SPINE FINDINGS Spinal visualization through the bottom of T4. Prevertebral soft tissues are within normal limits. Dense carotid atherosclerosis bilaterally. No apical pneumothorax. Advanced spondylosis. Left worse than right  neural foraminal narrowing at multiple levels. Mild central canal stenosis, most significant at C3-4. Prior median sternotomy. Maintenance of vertebral body height. Trace C3-4 retrolisthesis. From C7 inferiorly are mildly degraded secondary to patient body habitus. Facets are well-aligned. Coronal reformats demonstrate a normal C1-C2 articulation. IMPRESSION: 1. Mild left parietal scalp soft tissue swelling. No acute intracranial abnormality. 2. Advanced cervical spondylosis. Trace C3-4 is favored to be degenerative. Otherwise, no acute findings in the cervical spine identified. 3.  Cerebral atrophy and small vessel ischemic change. Electronically Signed   By: Abigail Miyamoto M.D.   On: 08/11/2015 16:50   Mr Brain Wo Contrast  08/12/2015  CLINICAL DATA:  Initial evaluation for syncope. EXAM: MRI HEAD WITHOUT CONTRAST TECHNIQUE: Multiplanar, multiecho pulse sequences of the brain and surrounding structures were obtained without intravenous contrast. COMPARISON:  Prior CT from on 08/11/2015. FINDINGS: Diffuse prominence of the CSF containing spaces is compatible with generalized age-related cerebral atrophy. Patchy T2/FLAIR hyperintensity within the periventricular and deep white matter both cerebral hemispheres most likely related chronic small vessel ischemic disease. Encephalomalacia within the left cerebellar hemisphere consistent with remote infarct. Associated chronic hemosiderin staining present within this region. Probable remote infarct within the anteromedial left frontal lobe as well. There is a possible tiny punctate 4 mm focus of high signal intensity on DWI sequence involving the cortical gray matter of the posterior right frontal region (series 3, image 31 on axial sequence, series 5, image 14 on coronal sequence). Finding may reflect a tiny acute ischemic infarct. Corresponding signal loss not definitely seen on ADC map, although this is felt to be too small to be resolved. No other definite acute  intracranial infarct. Abnormal flow void within the distal left vertebral artery, which may related to focal plaque or possibly chronic dissection (series 8, image 3), similar to prior. Major intracranial vascular flow voids otherwise maintained. The right vertebral artery is diminutive. No acute intracranial hemorrhage. No mass lesion, midline shift, or mass effect. No hydrocephalus. No extra-axial fluid collection. Craniocervical junction grossly within normal limits. Degenerative spondylolysis noted within the partially visualized upper cervical spine. Pituitary gland grossly normal. No acute abnormality about the orbits. Sequela prior lens extraction present on the left. Mild mucosal thickening within the maxillary sinuses and ethmoidal air cells. Paranasal sinuses are otherwise clear. Minimal opacity within the right mastoid air cells. Inner ear structures normal. Bone marrow signal intensity within normal limits. No scalp soft tissue abnormality. IMPRESSION: 1. Question 4 mm punctate focus of high signal intensity on DWI sequence within the posterior right frontal lobe. Finding favored to reflect artifact, although possible tiny acute ischemic infarct is not entirely excluded, and could be considered in the correct clinical setting. 2. No other acute intracranial process. 3. 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 4. Abnormal flow void within the distal left vertebral artery, which may be related to focal plaque or possibly chronic dissection. This is similar relative to prior study. 5. Age-related cerebral atrophy with mild to moderate chronic small vessel ischemic disease. Electronically Signed   By: Jeannine Boga M.D.   On: 08/12/2015 02:40   Dg Abd Acute W/chest  08/12/2015  CLINICAL DATA:  Episode of nausea and vomiting earlier, no abdominal pain, history of bowel resection and breast malignancy. EXAM: DG ABDOMEN ACUTE W/ 1V CHEST  COMPARISON:   Portable chest x-ray of August 11, 2015 and chest CT scan of the same day FINDINGS: The lungs are borderline hypoinflated. Confluent alveolar opacities are present at both lung bases greater on the right than on the left. A trace of pleural fluid on the right is suspected. The cardiac silhouette is mildly enlarged. The central pulmonary vascularity is engorged and indistinct. The patient has undergone previous CABG. The stool burden within the colon is mildly increased. There is a moderate amount of gas within loops of the sigmoid colon. There is no small or large bowel obstructive pattern and no evidence of perforation. There is degenerative change of the lower lumbar spine. There is contrast within the urinary bladder from the earlier CT scan. There is splenic arterial calcification. IMPRESSION: 1. Patchy airspace opacities bilaterally consistent with pneumonia. A small amount of pleural fluid is likely present on the right. 2. CHF with mild pulmonary vascular congestion. 3. No acute intra-abdominal abnormality is observed. Increased colonic stool burden may reflect constipation in the appropriate clinical setting. Electronically Signed   By: David  Martinique M.D.   On: 08/12/2015 08:16    PHYSICAL EXAM  General: Overweight elderly male, alert, cooperative, NAD. Multiple superficial ecchymoses.  HEENT: PERRL, EOMI. Moist mucus membranes Neck: Full range of motion without pain, supple, no lymphadenopathy or carotid bruits Lungs: Clear to ascultation bilaterally, normal work of respiration, no wheezes, rales, rhonchi Heart: Irregularly irregular, no murmurs, gallops, or rubs Abdomen: Soft, non-tender, non-distended, BS + Extremities: No cyanosis, clubbing, or edema  Mental Status -  Level of arousal and orientation to time, place, and person were intact. Language including expression, naming, repetition, comprehension was assessed and found intact. Attention span and concentration were normal. Recent  and remote memory were intact. Fund of Knowledge was assessed and was intact.  Cranial Nerves II - XII - II - Visual field intact OU. III, IV, VI - Extraocular movements intact. V - Facial sensation intact bilaterally. VII - Facial movement intact bilaterally. VIII - Hearing & vestibular intact bilaterally. X - Palate elevates symmetrically. XI - Chin turning & shoulder shrug intact bilaterally. XII - Tongue protrusion intac.  Motor Strength - The patient's strength was normal in all extremities and pronator drift was absent.  Bulk was normal and fasciculations were absent.   Motor Tone - Muscle tone was assessed at the neck and appendages and was normal.  Reflexes - The patient's reflexes were 1+ in all extremities and he had no pathological reflexes.  Sensory - Light touch, temperature/pinprick, vibration and proprioception, and Romberg testing were assessed and were symmetrical.    Coordination - The patient had normal movements in the hands and feet with no ataxia or dysmetria.  Mild right-sided tremor present.    ASSESSMENT/PLAN [name] is a 69 y.o. male w/ PMHx of CHF, CKD, previous h/o CVA, Gout, EtOH abuse, and OSA, admitted for suspected pneumonia and respiratory distress, also found to have TIA.   TIA: Incidental finding of non-dominant right posterior frontal lobe infarct embolic secondary to small vessel disease vs cardioembolic source  MRI:  Question 4 mm punctate focus of high signal intensity on DWI sequence within the posterior right frontal lobe.  Carotid Doppler  pending  LDL pending  HgbA1c 8.0 (06/10/15)  Lovenox for VTE prophylaxis  Diet heart healthy/carb modified Room service appropriate?: Yes; Fluid consistency:: Thin  aspirin 81 mg daily prior to admission, now on aspirin 81 mg daily.   Patient counseled to be  compliant with his antithrombotic medications  Ongoing aggressive stroke risk factor management  Therapy recommendations:   SNF  Disposition:  Pending, likely SNF per primary team  Acute Encephalopathy  Seems to have resolved. Suspect this is more 2/2 infection rather than incidental stroke finding.   Continue to monitor  Carotid dopplers pending as above  PAF  Patient w/ CHA2DS2-VASc Score and unadjusted Ischemic Stroke Rate (% per year) is equal to  9.7 % stroke rate/year from a score of 6, however, given his h/o multiple falls at home, EtOH  abuse, and thrombocytopenia, the patient is not a good candidate for further anticoagulation at  this time.   Could reconsider starting anticoagulation in the future if patient continues to abstain from  alcohol.   ?HCAP  Management per Primary team, on Vancomycin + Zosyn   Hypertension  Stable  Continue home medications: Coreg, Lasix  Hyperlipidemia  Home meds:  Lipitor resumed in hospital  LDL pending, goal < 70  Continue statin at discharge  Diabetes  HgbA1c 8.0, goal < 7.0  Controlled  Other Stroke Risk Factors  Advanced age  ETOH use  Obesity, Body mass index is 38.87 kg/(m^2).   Hx stroke/TIA  Obstructive sleep apnea, on CPAP at home   Hospital day # 2  Patient w/ no further neuro deficits, speech clear and intact, alert and oriented. MRI findings of stroke are incidental in nature and likely not the etiology of his acute encephalopathy. Suspect this is more related to hypoxia and respiratory distress in the setting of pneumonia. Would continue ASA at this time given patient's recent falls and EtOH abuse, not currently a candidate for further anticoagulation. However, patient did state he has been abstinent from alcohol for 4 months, therefore it could be worth reconsidering the need for anticoagulation in the future given his high annual stroke risk 2/2 atrial fibrillation.   Natasha Bence, MD PGY-3, Internal Medicine Pager: [phone]  I have personally examined this patient, reviewed notes, independently viewed imaging studies,  participated in medical decision making and plan of care. I have made any additions or clarifications directly to the above note. Agree with note above. Patient presented with transient loss speech difficulties in the setting of hypoxia and pneumonia and confusion.He appears to have improved but MRI scan shows a tiny punctate right frontal white matter infarct possibly embolic given his known history of atrial fibrillation. The patient however is not a good long-term anticoagulation candidate given history of alcohol abuse and multiple falls and low platelet count. Continue aspirin and  ongoing stroke workup. He remains at risk for recurrent stroke, TIAs and neurological worsening.  Antony Contras, MD Medical Director [facility] Pager: [phone] 08/13/2015 4:38 PM     To contact Stroke Continuity provider, please refer to [email or web address] After hours, contact General Neurology

## 2015-08-13 NOTE — Evaluation (Signed)
Physical Therapy Evaluation Patient Details Name: [name] MRN: [id number] DOB: [birth year] Today's Date: 08/13/2015   History of Present Illness  69 yo male who looks older than stated age has new acute respiratory failure after episode of sepsis from cellulitis and new R frontal punctate infarct.  PHx:  COPD, HTN, PNA, falls  Clinical Impression  Pt is expecting to get up to walk with minor assistance, and is not aware of his real condition.  He requires 2 person assist to even attempt steps and will not be able to go home as he is for a single person to care for him.  Will request a SNF stay to restore independence on RW as PLOF, but will not anticipate he goes home from hosp unless further abilities to control standing emerge.    Follow Up Recommendations SNF    Equipment Recommendations  None recommended by PT    Recommendations for Other Services Rehab consult     Precautions / Restrictions Precautions Precautions: Fall Precaution Comments: multiple lines including telemetry, condom cath, IV, O2.   Restrictions Weight Bearing Restrictions: No      Mobility  Bed Mobility               General bed mobility comments: up when PT entered  Transfers Overall transfer level: Needs assistance Equipment used: Rolling walker (2 wheeled);1 person hand held assist Transfers: Sit to/from Stand Sit to Stand: Max assist;From elevated surface Stand pivot transfers:  (not able to perform)       General transfer comment: cued hand placemetn and sequence  Ambulation/Gait             General Gait Details: Too unsteady for safe gait   Stairs            Wheelchair Mobility    Modified Rankin (Stroke Patients Only) Modified Rankin (Stroke Patients Only) Pre-Morbid Rankin Score: Moderate disability Modified Rankin: Moderately severe disability     Balance Overall balance assessment: Needs assistance Sitting-balance support: Feet supported Sitting  balance-Leahy Scale: Poor   Postural control: Posterior lean Standing balance support: Bilateral upper extremity supported Standing balance-Leahy Scale: Poor                               Pertinent Vitals/Pain Pain Assessment: No/denies pain Pain Score: 0-No pain    Home Living Family/patient expects to be discharged to:: Unsure Living Arrangements: Children Available Help at Discharge: Family;Available 24 hours/day Type of Home: House Home Access: Stairs to enter Entrance Stairs-Rails: Right;Left;Can reach both Entrance Stairs-Number of Steps: 4 Home Layout: One level Home Equipment: Walker - 2 wheels;Bedside commode;Shower seat Additional Comments: Has confusion about his abiltiy to walk and stated to PT, " I can make it to the door" in spite of clearly leaning backward in standing.      Prior Function Level of Independence: Needs assistance   Gait / Transfers Assistance Needed: pt ambulates with RW limited distance  ADL's / Homemaking Assistance Needed: daughter home 24/7 per pt  Comments: not able to trust PLOF from pt as he is unrealistic about his level of debility upon eval     Hand Dominance        Extremity/Trunk Assessment   Upper Extremity Assessment: Generalized weakness           Lower Extremity Assessment: Generalized weakness (abdominal muscles are poor)      Cervical / Trunk Assessment: Normal  Communication  Cognition Arousal/Alertness: Awake/alert Behavior During Therapy: Flat affect Overall Cognitive Status: No family/caregiver present to determine baseline cognitive functioning Area of Impairment: Orientation;Attention;Memory;Following commands;Safety/judgement;Problem solving;Awareness Orientation Level: Disoriented to;Situation;Time Current Attention Level: Alternating Memory: Decreased recall of precautions;Decreased short-term memory Following Commands: Follows one step commands inconsistently Safety/Judgement:  Decreased awareness of safety;Decreased awareness of deficits Awareness: Intellectual;Anticipatory Problem Solving: Slow processing;Difficulty sequencing;Requires verbal cues      General Comments General comments (skin integrity, edema, etc.): Pt has limited ability to follow instructions mainly due to his contention that all effort is to be fully supported by whatever it takes for PT.  Had poor body awareness to see that in standing he was falling back and that he could not control it.  Sat with near total assist as pt did not flex LE's well when asked and did not help control descent.    Exercises        Assessment/Plan    PT Assessment Patient needs continued PT services  PT Diagnosis Difficulty walking;Generalized weakness   PT Problem List Decreased strength;Decreased range of motion;Decreased activity tolerance;Decreased balance;Decreased mobility;Decreased coordination;Decreased cognition;Decreased knowledge of use of DME;Decreased safety awareness;Decreased knowledge of precautions;Cardiopulmonary status limiting activity;Obesity  PT Treatment Interventions DME instruction;Gait training;Stair training;Functional mobility training;Therapeutic activities;Therapeutic exercise;Balance training;Neuromuscular re-education;Cognitive remediation;Patient/family education   PT Goals (Current goals can be found in the Care Plan section) Acute Rehab PT Goals Patient Stated Goal: to walk PT Goal Formulation: With patient Time For Goal Achievement: 08/27/15 Potential to Achieve Goals: Fair    Frequency Min 2X/week   Barriers to discharge Decreased caregiver support (2 person assist needed to just stand)      Co-evaluation               End of Session Equipment Utilized During Treatment: Oxygen;Gait belt Activity Tolerance: Patient limited by lethargy;Other (comment) (cognition) Patient left: in chair;with call bell/phone within reach Nurse Communication: Mobility status          Time: 8502-7741 PT Time Calculation (min) (ACUTE ONLY): 33 min   Charges:   PT Evaluation $Initial PT Evaluation Tier I: 1 Procedure PT Treatments $Therapeutic Activity: 8-22 mins   PT G Codes:        Delando, Satter 03-Sep-2015, 2:38 PM  Mee Hives, PT MS Acute Rehab Dept. Number: [phone] [id number] and [place] [phone]

## 2015-08-13 NOTE — Consult Note (Signed)
Patient ID: [name] MRN: [id number], DOB/AGE: 02/06/1946   Admit date: 08/11/2015   Primary Physician: [name] Every, MD Primary Cardiologist: Dr. Angelena [name] CHF: Dr. Aundra [name]  [name]. Profile:  69 y.o. male with a hx of CAD, status post CABG in 2001, atrial fibrillation (not an anticoagulation candidate), COPD, sleep apnea, CKD stage III, diastolic CHF, carotid stenosis s/p L CEA, incarcerated hernia with small bowel resection 4/15 c/b enterocutaneous fistula, admitted by IM for acute respiratory failure and acute encephalopathy, in the setting of presumed aspiration PNA and acute on chronic diastolic CHF.   Problem List  Past Medical History  Diagnosis Date  . CHRONIC OBSTRUCTIVE PULMONARY DISEASE 06/20/2009  . CAROTID STENOSIS 06/20/2009    A. 08/2001 s/p L CEA;  B.   09/14/11 - Carotid U/S - 40-59% bilateral stenosis, left CEA patch angioplasty is patent  . DM 06/20/2009  . CAD 06/20/2009    A.  08/2000 - s/p CABG x 4 - LIMA-LAD, Left Radial-OM, VG-DIAG, VG-RCA;  B. Neg. MV  2010  . HYPERLIPIDEMIA 06/20/2009  . HYPERTENSION 06/20/2009  . Hypothyroidism   . Low back pain   . Asthma     as child  . Pneumonia   . Persistent atrial fibrillation (HCC)     Not felt to be coumadin candidate 2/2 ETOH use.  Marland Kitchen History of tobacco abuse     remote - quit 1970  . Morbidly obese ([place])   . Chronic diastolic heart failure, NYHA class 3 ([place])     Followed by CHF Clinic --> Echo 04/2014: EF 42-87%, Diastolic DysFxn - elevated LVEDP & LAP. Mod Dil LA & RA.  . Falls frequently   . Hx of cardiovascular stress test 05/2014    Lexiscan Myoview (8/15):  No ischemia; EF 63% - Normal Study  . Breast cancer, right breast ([place])   . Gout   . OBSTRUCTIVE SLEEP APNEA 06/20/2009    does not use cpap  . Stroke [facility])     found on MRI in Oct. 2015.   Marland Kitchen Shortness of breath dyspnea     occasional  . Depression     takes Prozac  . GERD (gastroesophageal reflux disease)     takes Prilosec  . Arthritis     . Neuromuscular disorder (HCC)     neuropathy in both legs  . Constipation   . ETOH abuse     no alcohol since Oct. 2015  . 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 CHF (congestive heart failure) ([place])   . Supplemental oxygen dependent     hs  . Bilateral renal cysts   . Kidney disease     Stage 3    Past Surgical History  Procedure Laterality Date  . Carotid endarterectomy  2002    left  . Coronary artery bypass graft      x 4 - 2001  . Umbilical hernia repair N/A 01/28/2014    Procedure: HERNIA REPAIR UMBILICAL ADULT/INC;  Surgeon: [name] Bowie, MD;  Location: [place];  Service: General;  Laterality: N/A;  . Laparotomy N/A 01/28/2014    Procedure: EXPLORATORY LAPAROTOMY;  Surgeon: [name] Bowie, MD;  Location: [place];  Service: General;  Laterality: N/A;  . Bowel resection N/A 01/28/2014    Procedure: SMALL BOWEL RESECTION;  Surgeon: [name] Bowie, MD;  Location: [place];  Service: General;  Laterality: N/A;  . Hernia repair    .  Colonoscopy    . Total mastectomy Right 11/11/2014    Procedure: RIGHT TOTAL MASTECTOMY;  Surgeon: [name] Seltzer, MD;  Location: [place];  Service: General;  Laterality: Right;  . Cataract extraction w/phaco Left 04/08/2015    Procedure: CATARACT EXTRACTION PHACO AND INTRAOCULAR LENS PLACEMENT ([place]);  Surgeon: [name] Robson, MD;  Location: [facility];  Service: Ophthalmology;  Laterality: Left;  [place] 00:36 AP% 23.7 CDE 8.72 fluid pack AQT#[id number] H  . Eye surgery Left     Catarct  . Laparotomy N/A 06/02/2015    Procedure: EXPLORATORY LAPAROTOMY;  Surgeon: [name] Keens, MD;  Location: [place];  Service: General;  Laterality: N/A;  . Bowel resection N/A 06/02/2015    Procedure: SMALL BOWEL RESECTION;  Surgeon: [name] Keens, MD;  Location: [place];  Service: General;  Laterality: N/A;     Allergies  Allergies  Allergen Reactions  . Erythromycin Hives    Oramycin    HPI  [name] is a 69 y.o. male with a hx of CAD, status post CABG in 2001, chronic atrial fibrillation, COPD, sleep apnea, CKD stage III, diastolic CHF, carotid stenosis s/p L CEA, incarcerated hernia with small bowel resection 4/15 c/b enterocutaneous fistula. Patient is not a Coumadin candidate secondary to history of alcohol abuse. He was admitted 04/2014 with acute on chronic diastolic CHF and developed cardiorenal syndrome requiring temporary hemodialysis. 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 Myoview 05/2014 was neg for ischemia. His most recent 2D echo 06/2015 showed normal LVEF of 60-65%, normal wall motion and mild AS. He also has a hx of breast CA and underwent mastectomy. He is followed by Dr. Angelena [name] and Dr. Aundra [name].   He [facility] ED 08/11/15 by EMS for weakness, confusion near syncope and dyspnea.   In ED, patient was noted to be hypoxic. CT angiogram of the chest showed possible pneumonia. CXR showed stable cardiomegaly and mild chronic ILD with mild pulmonary vascular congestion. BNP was 299. EKG showed atrial fibrillation with a CVR. Troponin with a flat, low level trend at 0.09, 0.08 and 0.07. Given his confusion, head CT was also ordered which was nonacute. He was admitted by IM for further management of acute respiratory failure and acute encephalopathy. Antibiotics have been started for suspected aspiration PNA. Neurology is following and conducting w/u for his encephalopathy.   Cardiology has been consulted to help manage his CHF and atrial fibrillation. Per records, his dry weight appears to be ~244 lb. His weight today is 255 lb. Labs show acute on chronic renal insufficiency with SCr now at 1.67 (1.36 on arrival). He was initially receiving 60 mg IV Lasix, BID. This has been reduced to 40 mg BID.    He notes improvement in breathing with treatment. He denies any chest pain.     Home Medications  Prior to Admission medications   Medication Sig Start Date End Date Taking?  Authorizing Provider  albuterol-ipratropium (COMBIVENT) 18-103 MCG/ACT inhaler Inhale 2 puffs into the lungs 2 (two) times daily. 06/18/15  Yes Ivan Anchors Love, PA-C  allopurinol (ZYLOPRIM) 300 MG tablet Take 1 tablet (300 mg total) by mouth daily. 08/02/14  Yes Daniel J Angiulli, PA-C  aspirin EC 325 MG EC tablet Take 1 tablet (325 mg total) by mouth daily. 08/02/14  Yes Daniel J Angiulli, PA-C  atorvastatin (LIPITOR) 20 MG tablet Take 1 tablet (20 mg total) by mouth daily. 08/02/14  Yes Daniel J Angiulli, PA-C  carvedilol (COREG) 3.125 MG tablet Take 2 tablets (  6.25 mg total) by mouth 2 (two) times daily with a meal. 07/07/15  Yes Bonnielee Haff, MD  colchicine 0.6 MG tablet Take 0.6 mg by mouth daily. 01/13/15  Yes Historical Provider, MD  FLUoxetine (PROZAC) 20 MG capsule Take 1 capsule (20 mg total) by mouth daily. 06/18/15  Yes Ivan Anchors Love, PA-C  fluticasone (FLOVENT HFA) 110 MCG/ACT inhaler Inhale 2 puffs into the lungs 2 (two) times daily.   Yes Historical Provider, MD  folic acid (FOLVITE) 1 MG tablet Take 1 tablet (1 mg total) by mouth daily. 08/02/14  Yes Daniel J Angiulli, PA-C  furosemide (LASIX) 40 MG tablet Take 1 tablet (40 mg total) by mouth daily. [name] take 120 mg in AM and 80 mg in PM 07/15/15  Yes Scott T Weaver, PA-C  furosemide (LASIX) 80 MG tablet Take 1 tablet (80 mg total) by mouth as directed. take 120 mg in AM and 80 mg in afternoon 07/15/15  Yes Scott T Weaver, PA-C  gabapentin (NEURONTIN) 300 MG capsule Take 1 capsule (300 mg total) by mouth 2 (two) times daily. 08/02/14  Yes Daniel J Angiulli, PA-C  levothyroxine (SYNTHROID, LEVOTHROID) 125 MCG tablet Take 1.5 tablets (187 mcg total) by mouth daily before breakfast. 08/02/14  Yes Daniel J Angiulli, PA-C  magnesium oxide (MAG-OX) 400 (241.3 MG) MG tablet Take 1 tablet (400 mg total) by mouth 2 (two) times daily. 06/26/15  Yes Scott Joylene Draft, PA-C  omeprazole (PRILOSEC) 20 MG capsule Take 1 capsule (20 mg total) by mouth daily.  08/02/14  Yes Daniel J Angiulli, PA-C  Oxycodone HCl 10 MG TABS Take 10 mg by mouth 3 (three) times daily as needed (pain).   Yes Historical Provider, MD  potassium chloride SA (KLOR-CON M20) 20 MEQ tablet Take 2 tablets (40 mEq total) by mouth as directed. 40 meq in the AM, 20 meq in the PM 07/16/15  Yes Scott T Kathlen Mody, PA-C  tamoxifen (NOLVADEX) 20 MG tablet Take 1 tablet (20 mg total) by mouth daily. Patient taking differently: Take 20 mg by mouth at bedtime.  09/09/14  Yes Nicholas Lose, MD  venlafaxine XR (EFFEXOR-XR) 37.5 MG 24 hr capsule TAKE ONE CAPSULE BY MOUTH DAILY 07/14/15  Yes Nicholas Lose, MD  metolazone (ZAROXOLYN) 2.5 MG tablet TAKE 1 TABLET BY MOUTH DAILY 30 MINUTES PRIOR TO LASIX DOSAGE 07/09/15   Historical Provider, MD  neomycin-bacitracin-polymyxin (NEOSPORIN) OINT Apply 1 application topically 2 (two) times daily. To left 3rd toe Patient not taking: Reported on 08/11/2015 07/07/15   Bonnielee Haff, MD    Family History  Family History  Problem Relation Age of Onset  . CVA Mother     ?  Marland Kitchen Cancer Mother     unknown type of cancer  . CVA Father     ?  Marland Kitchen Heart attack Neg Hx   . Stomach cancer Brother 47    stomach cancer    Social History  Social History   Social History  . Marital Status: Single    Spouse Name: N/A  . Number of Children: N/A  . Years of Education: N/A   Occupational History  . retired    Social History Main Topics  . Smoking status: Former Smoker -- 1.00 packs/day for 16 years    Quit date: 10/11/1968  . Smokeless tobacco: Never Used  . Alcohol Use: Yes     Comment: last drink was 2 weeks - 8?16  . Drug Use: Yes    Special: Marijuana  Comment: last 1 year ago  . Sexual Activity: No   Other Topics Concern  . Not on file   Social History Narrative   Lives in [place] with dtr, son-in-law     Review of Systems General:  No chills, fever, night sweats or weight changes.  Cardiovascular:  No chest pain, dyspnea on exertion,  edema, orthopnea, palpitations, paroxysmal nocturnal dyspnea. Dermatological: No rash, lesions/masses Respiratory: No cough, dyspnea Urologic: No hematuria, dysuria Abdominal:   No nausea, vomiting, diarrhea, bright red blood per rectum, melena, or hematemesis Neurologic:  No visual changes, wkns, changes in mental status. All other systems reviewed and are otherwise negative except as noted above.  Physical Exam  Blood pressure 115/66, pulse 71, temperature 98.4 F (36.9 C), temperature source Oral, resp. rate 18, weight 255 lb 9.6 oz (115.939 kg), SpO2 97 %.  General: Pleasant, NAD Psych: Normal affect. Neuro: Alert and oriented X 3. Moves all extremities spontaneously. HEENT: Normal  Neck: + left carotid bruit  Lungs:  Resp regular and unlabored, decreased BS at the bases Heart: irregularly irregular. Reg rate.  Abdomen: Soft, non-tender, non-distended, BS + x 4.  Extremities: No clubbing DP/[name]/Radials 2+ and equal bilaterally. Bilateral LE pitting edema. Both extremities wrapped in ace dressing.   Labs  Troponin [facility] Test)  Recent Labs  08/11/15 1616  TROPIPOC 0.02    Recent Labs  08/12/15 0308 08/12/15 0726 08/12/15 1255  TROPONINI 0.09* 0.08* 0.07*   Lab Results  Component Value Date   WBC 5.0 08/13/2015   HGB 10.1* 08/13/2015   HCT 33.1* 08/13/2015   MCV 100.6* 08/13/2015   PLT 92* 08/13/2015    Recent Labs Lab 08/13/15 0745  NA 135  K 3.9  CL 95*  CO2 34*  BUN 39*  CREATININE 1.67*  CALCIUM 9.0  GLUCOSE 155*   Lab Results  Component Value Date   CHOL 100 08/02/2014   HDL 34* 08/02/2014   LDLCALC 48 08/02/2014   TRIG 88 08/02/2014   Lab Results  Component Value Date   DDIMER  04/05/2010    0.42        AT THE INHOUSE ESTABLISHED CUTOFF VALUE OF 0.48 ug/mL FEU, THIS ASSAY HAS BEEN DOCUMENTED IN THE LITERATURE TO HAVE A SENSITIVITY AND NEGATIVE PREDICTIVE VALUE OF AT LEAST 98 TO 99%.  THE TEST RESULT SHOULD BE CORRELATED  WITH AN ASSESSMENT OF THE CLINICAL PROBABILITY OF DVT / VTE.     Radiology/Studies  Dg Chest 2 View  08/11/2015  CLINICAL DATA:  Shortness of breath. Altered mental status. Choked while eating this morning. Possible aspiration. EXAM: CHEST  2 VIEW COMPARISON:  07/04/2015. FINDINGS: Stable enlarged cardiac silhouette and post CABG changes. Interval mild patchy and linear density at the right lung base. Stable prominence of the interstitial markings. Mild increase in prominence of the pulmonary vasculature. Breathing motion blurring on the lateral view. IMPRESSION: 1. Atelectasis at the right lung base. Superimposed aspiration is unlikely based on the current appearance. 2. Stable cardiomegaly and mild chronic interstitial lung disease with interval mild pulmonary vascular congestion. Electronically Signed   By: Claudie Revering M.D.   On: 08/11/2015 17:07   Ct Head Wo Contrast  08/11/2015  CLINICAL DATA:  Initial encounter for 69 YOM walking using his walker and fell backwards and hit his head today 08/11/15. EXAM: CT HEAD WITHOUT CONTRAST CT CERVICAL SPINE WITHOUT CONTRAST TECHNIQUE: Multidetector CT imaging of the head and cervical spine was performed following the standard protocol without  intravenous contrast. Multiplanar CT image reconstructions of the cervical spine were also generated. COMPARISON:  Head CT of 05/26/2015. No prior cervical spine imaging. FINDINGS: CT HEAD FINDINGS Sinuses/Soft tissues: Mild left parietal scalp soft tissue thickening on image/series 67/4. Right maxillary sinus mucous retention cyst or polyp. No skull fracture. Clear mastoid air cells. Intracranial: Cerebral atrophy. Moderate low density in the periventricular white matter likely related to small vessel disease. Left cerebellar remote infarct. No mass lesion, hemorrhage, hydrocephalus, acute infarct, intra-axial, or extra-axial fluid collection. CT CERVICAL SPINE FINDINGS Spinal visualization through the bottom of T4.  Prevertebral soft tissues are within normal limits. Dense carotid atherosclerosis bilaterally. No apical pneumothorax. Advanced spondylosis. Left worse than right neural foraminal narrowing at multiple levels. Mild central canal stenosis, most significant at C3-4. Prior median sternotomy. Maintenance of vertebral body height. Trace C3-4 retrolisthesis. From C7 inferiorly are mildly degraded secondary to patient body habitus. Facets are well-aligned. Coronal reformats demonstrate a normal C1-C2 articulation. IMPRESSION: 1. Mild left parietal scalp soft tissue swelling. No acute intracranial abnormality. 2. Advanced cervical spondylosis. Trace C3-4 is favored to be degenerative. Otherwise, no acute findings in the cervical spine identified. 3.  Cerebral atrophy and small vessel ischemic change. Electronically Signed   By: Abigail Miyamoto M.D.   On: 08/11/2015 16:50   Ct Angio Chest Pe W/cm &/or Wo Cm  08/11/2015  CLINICAL DATA:  Worsening dyspnea.  Altered mental status. EXAM: CT ANGIOGRAPHY CHEST WITH CONTRAST TECHNIQUE: Multidetector CT imaging of the chest was performed using the standard protocol during bolus administration of intravenous contrast. Multiplanar CT image reconstructions and MIPs were obtained to evaluate the vascular anatomy. CONTRAST:  20mL OMNIPAQUE IOHEXOL 350 MG/ML SOLN COMPARISON:  08/28/2014 FINDINGS: Cardiovascular: There is good opacification of the pulmonary arteries. There is no pulmonary embolism. The thoracic aorta is normal in caliber and intact. There is extensive calcified coronary artery plaque. There are multiple coronary artery bypass grafts. Lungs: There are multifocal confluent airspace opacities, central predominant. Considerations include alveolar edema, infectious infiltrate. Hemorrhage or neoplasm less likely but not entirely excluded. Central airways: Patent Effusions: None Lymphadenopathy: Mildly prominent hilar and mediastinal nodes, more likely reactive Esophagus:  Unremarkable Upper abdomen: Unremarkable Musculoskeletal: No significant abnormality except for moderate thoracic degenerative disc disease, kyphosis, and prior sternotomy. Review of the MIP images confirms the above findings. IMPRESSION: 1. Negative for acute pulmonary embolism 2. Confluent airspace opacities bilaterally, right worse than left. Infectious infiltrate is a leading consideration. Alveolar edema less likely but not excluded. There also are mildly prominent hilar and mediastinal nodes which may be reactive. Electronically Signed   By: Andreas Newport M.D.   On: 08/11/2015 23:21   Ct Cervical Spine Wo Contrast  08/11/2015  CLINICAL DATA:  Initial encounter for 69 YOM walking using his walker and fell backwards and hit his head today 08/11/15. EXAM: CT HEAD WITHOUT CONTRAST CT CERVICAL SPINE WITHOUT CONTRAST TECHNIQUE: Multidetector CT imaging of the head and cervical spine was performed following the standard protocol without intravenous contrast. Multiplanar CT image reconstructions of the cervical spine were also generated. COMPARISON:  Head CT of 05/26/2015. No prior cervical spine imaging. FINDINGS: CT HEAD FINDINGS Sinuses/Soft tissues: Mild left parietal scalp soft tissue thickening on image/series 67/4. Right maxillary sinus mucous retention cyst or polyp. No skull fracture. Clear mastoid air cells. Intracranial: Cerebral atrophy. Moderate low density in the periventricular white matter likely related to small vessel disease. Left cerebellar remote infarct. No mass lesion, hemorrhage, hydrocephalus, acute infarct, intra-axial, or extra-axial  fluid collection. CT CERVICAL SPINE FINDINGS Spinal visualization through the bottom of T4. Prevertebral soft tissues are within normal limits. Dense carotid atherosclerosis bilaterally. No apical pneumothorax. Advanced spondylosis. Left worse than right neural foraminal narrowing at multiple levels. Mild central canal stenosis, most significant at C3-4.  Prior median sternotomy. Maintenance of vertebral body height. Trace C3-4 retrolisthesis. From C7 inferiorly are mildly degraded secondary to patient body habitus. Facets are well-aligned. Coronal reformats demonstrate a normal C1-C2 articulation. IMPRESSION: 1. Mild left parietal scalp soft tissue swelling. No acute intracranial abnormality. 2. Advanced cervical spondylosis. Trace C3-4 is favored to be degenerative. Otherwise, no acute findings in the cervical spine identified. 3.  Cerebral atrophy and small vessel ischemic change. Electronically Signed   By: Abigail Miyamoto M.D.   On: 08/11/2015 16:50   Mr Brain Wo Contrast  08/12/2015  CLINICAL DATA:  Initial evaluation for syncope. EXAM: MRI HEAD WITHOUT CONTRAST TECHNIQUE: Multiplanar, multiecho pulse sequences of the brain and surrounding structures were obtained without intravenous contrast. COMPARISON:  Prior CT from on 08/11/2015. FINDINGS: Diffuse prominence of the CSF containing spaces is compatible with generalized age-related cerebral atrophy. Patchy T2/FLAIR hyperintensity within the periventricular and deep white matter both cerebral hemispheres most likely related chronic small vessel ischemic disease. Encephalomalacia within the left cerebellar hemisphere consistent with remote infarct. Associated chronic hemosiderin staining present within this region. Probable remote infarct within the anteromedial left frontal lobe as well. There is a possible tiny punctate 4 mm focus of high signal intensity on DWI sequence involving the cortical gray matter of the posterior right frontal region (series 3, image 31 on axial sequence, series 5, image 14 on coronal sequence). Finding may reflect a tiny acute ischemic infarct. Corresponding signal loss not definitely seen on ADC map, although this is felt to be too small to be resolved. No other definite acute intracranial infarct. Abnormal flow void within the distal left vertebral artery, which may related to  focal plaque or possibly chronic dissection (series 8, image 3), similar to prior. Major intracranial vascular flow voids otherwise maintained. The right vertebral artery is diminutive. No acute intracranial hemorrhage. No mass lesion, midline shift, or mass effect. No hydrocephalus. No extra-axial fluid collection. Craniocervical junction grossly within normal limits. Degenerative spondylolysis noted within the partially visualized upper cervical spine. Pituitary gland grossly normal. No acute abnormality about the orbits. Sequela prior lens extraction present on the left. Mild mucosal thickening within the maxillary sinuses and ethmoidal air cells. Paranasal sinuses are otherwise clear. Minimal opacity within the right mastoid air cells. Inner ear structures normal. Bone marrow signal intensity within normal limits. No scalp soft tissue abnormality. IMPRESSION: 1. Question 4 mm punctate focus of high signal intensity on DWI sequence within the posterior right frontal lobe. Finding favored to reflect artifact, although possible tiny acute ischemic infarct is not entirely excluded, and could be considered in the correct clinical setting. 2. No other acute intracranial process. 3. 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 4. Abnormal flow void within the distal left vertebral artery, which may be related to focal plaque or possibly chronic dissection. This is similar relative to prior study. 5. Age-related cerebral atrophy with mild to moderate chronic small vessel ischemic disease. Electronically Signed   By: Jeannine Boga M.D.   On: 08/12/2015 02:40   Dg Abd Acute W/chest  08/12/2015  CLINICAL DATA:  Episode of nausea and vomiting earlier, no abdominal pain, history of bowel resection and breast malignancy. EXAM: DG ABDOMEN  ACUTE W/ 1V CHEST COMPARISON:  Portable chest x-ray of August 11, 2015 and chest CT scan of the same day FINDINGS: The lungs are  borderline hypoinflated. Confluent alveolar opacities are present at both lung bases greater on the right than on the left. A trace of pleural fluid on the right is suspected. The cardiac silhouette is mildly enlarged. The central pulmonary vascularity is engorged and indistinct. The patient has undergone previous CABG. The stool burden within the colon is mildly increased. There is a moderate amount of gas within loops of the sigmoid colon. There is no small or large bowel obstructive pattern and no evidence of perforation. There is degenerative change of the lower lumbar spine. There is contrast within the urinary bladder from the earlier CT scan. There is splenic arterial calcification. IMPRESSION: 1. Patchy airspace opacities bilaterally consistent with pneumonia. A small amount of pleural fluid is likely present on the right. 2. CHF with mild pulmonary vascular congestion. 3. No acute intra-abdominal abnormality is observed. Increased colonic stool burden may reflect constipation in the appropriate clinical setting. Electronically Signed   By: David  Martinique M.D.   On: 08/12/2015 08:16    ECG  Atrial fibrillation. 89 bpm    ASSESSMENT AND PLAN  Principal Problem:   Acute respiratory failure with hypoxia (HCC) Active Problems:   Obstructive sleep apnea   Coronary atherosclerosis of native coronary artery   PAF (paroxysmal atrial fibrillation) (HCC)   Hypothyroidism   COPD (chronic obstructive pulmonary disease) (HCC)   Enterocutaneous fistula   Diabetes mellitus type 2, controlled ([place])   A-fib (HCC)   Acute encephalopathy   1. Acute Respiratory Failure w/ Hypoxia: likely multifactorial from probable Aspiration PNA and acute diastolic CHF.   2. PNA: on antibiotics. Management per IM.   3. A/C Diastolic CHF: EF on echo 11/2977 was normal at 55-60%. His office weight at time of last OV with Richardson Dopp, 07/22/15, was 242 lb. His weight today is 255 lb. Bilateral LEE noted on physical  exam. BNP at time of admit was elevated at 299. Continue IV lasix but monitor renal function closely. Low sodium diet. Check daily weights. Strict I/Os.   4. Chronic Atrial Fibrillation: rate is well controlled on Coreg. Per clinic notes, he has not been an ideal anticoagulation candidate due to alcohol abuse. Also with recent h/o falls. Continue high dose ASA.   5. CAD: h/o CABG in 2001. NST in 2015 was negative for ischemia. He denies CP. Continue ASA, statin and BB.   6. Acute encephalopathy: MRI of brain performed and showed punctate infarct in the right frontal lobe. Seen by neurology earlier today. It was noted, "MRI findings of stroke are incidental in nature and likely not the etiology of his acute encephalopathy. Suspect this is more related to hypoxia and respiratory distress in the setting of pneumonia". Their recommendations are for continuation of high dose ASA given his afib and h/o recent falls and EtOH use, making him a poor candidate for anticoagulation. EEG also pending.   Janee Morn, PA-C 08/13/2015, 3:34 PM

## 2015-08-13 NOTE — Progress Notes (Signed)
Pt refuses CPAP. He says he does have one at home but rarely wears it. He does not wish to wear here tonight. Pt seems confused. RN aware. Will call if pt changes his mind. RT will monitor.

## 2015-08-13 NOTE — Progress Notes (Signed)
SLP Cancellation Note  Patient Details Name: [name] MRN: [id number] DOB: 1946-03-06   Cancelled treatment:       Reason Eval/Treat Not Completed: Patient at procedure or test/unavailable   [name], M.A. CCC-SLP [phone]  [name] 08/13/2015, 4:15 PM

## 2015-08-13 NOTE — Procedures (Signed)
ELECTROENCEPHALOGRAM REPORT  Patient: [name]       Room #: 1P37 EEG No. ID:  Age: 69 y.o.        Sex: male Referring Physician: Nena Alexander Report Date:  08/13/2015        Interpreting Physician: Curtez Sar  History: [name] is an 69 y.o. male with multiple medical disorders admitted with acute stroke.  Indications for study:  Rule out encephalopathy; rule out seizure disorder.  Technique: This is an 18 channel routine scalp EEG performed at the bedside with bipolar and monopolar montages arranged in accordance to the international 10/20 system of electrode placement.   Description: This EEG recording was performed during wakefulness. Background activity consisted of low amplitude diffuse irregular delta activity with superimposed 6-7 Hz diffuse theta activity which was most prominent posteriorly. Photic stimulation and hyperventilation were not performed. No epileptiform discharges were recorded.  Interpretation: This EEG is abnormal with mild generalized nonspecific continuous slowing of cerebral activity which can be seen with a wide variety of abnormalities, including toxic and metabolic encephalopathies as well as degenerative central nervous system disorders. No evidence of an epileptic disorder was demonstrated.   Rush Farmer M.D. [facility] [phone]

## 2015-08-14 ENCOUNTER — Inpatient Hospital Stay (HOSPITAL_COMMUNITY): Payer: Medicare Other

## 2015-08-14 DIAGNOSIS — I5033 Acute on chronic diastolic (congestive) heart failure: Secondary | ICD-10-CM | POA: Insufficient documentation

## 2015-08-14 DIAGNOSIS — R55 Syncope and collapse: Secondary | ICD-10-CM

## 2015-08-14 DIAGNOSIS — I639 Cerebral infarction, unspecified: Secondary | ICD-10-CM

## 2015-08-14 DIAGNOSIS — N179 Acute kidney failure, unspecified: Secondary | ICD-10-CM | POA: Insufficient documentation

## 2015-08-14 DIAGNOSIS — N171 Acute kidney failure with acute cortical necrosis: Secondary | ICD-10-CM

## 2015-08-14 DIAGNOSIS — I63419 Cerebral infarction due to embolism of unspecified middle cerebral artery: Secondary | ICD-10-CM

## 2015-08-14 DIAGNOSIS — J9601 Acute respiratory failure with hypoxia: Secondary | ICD-10-CM

## 2015-08-14 DIAGNOSIS — I480 Paroxysmal atrial fibrillation: Secondary | ICD-10-CM

## 2015-08-14 LAB — BASIC METABOLIC PANEL
ANION GAP: 10 (ref 5–15)
BUN: 47 mg/dL — ABNORMAL HIGH (ref 6–20)
CALCIUM: 8.9 mg/dL (ref 8.9–10.3)
CO2: 30 mmol/L (ref 22–32)
CREATININE: 2.09 mg/dL — AB (ref 0.61–1.24)
Chloride: 95 mmol/L — ABNORMAL LOW (ref 101–111)
GFR, EST AFRICAN AMERICAN: 35 mL/min — AB (ref >60)
GFR, EST NON AFRICAN AMERICAN: 30 mL/min — AB (ref >60)
Glucose, Bld: 176 mg/dL — ABNORMAL HIGH (ref 65–99)
Potassium: 4.6 mmol/L (ref 3.5–5.1)
SODIUM: 135 mmol/L (ref 135–145)

## 2015-08-14 LAB — GLUCOSE, CAPILLARY
GLUCOSE-CAPILLARY: 176 mg/dL — AB (ref 65–99)
Glucose-Capillary: 204 mg/dL — ABNORMAL HIGH (ref 65–99)
Glucose-Capillary: 157 mg/dL — ABNORMAL HIGH (ref 65–99)
Glucose-Capillary: 152 mg/dL — ABNORMAL HIGH (ref 65–99)

## 2015-08-14 LAB — BRAIN NATRIURETIC PEPTIDE: B Natriuretic Peptide: 584.6 pg/mL — ABNORMAL HIGH (ref 0.0–100.0)

## 2015-08-14 LAB — HEMOGLOBIN A1C
HEMOGLOBIN A1C: 6.8 % — AB (ref 4.8–5.6)
MEAN PLASMA GLUCOSE: 148 mg/dL

## 2015-08-14 LAB — LIPID PANEL
Cholesterol: 108 mg/dL (ref 0–200)
HDL: 26 mg/dL — AB (ref >40)
LDL CALC: 65 mg/dL (ref 0–99)
TRIGLYCERIDES: 87 mg/dL (ref <150)
Total CHOL/HDL Ratio: 4.2 RATIO
VLDL: 17 mg/dL (ref 0–40)

## 2015-08-14 LAB — VITAMIN B1: VITAMIN B1 (THIAMINE): 146.3 nmol/L (ref 66.5–200.0)

## 2015-08-14 LAB — LEGIONELLA PNEUMOPHILA SEROGP 1 UR AG: L. pneumophila Serogp 1 Ur Ag: NEGATIVE

## 2015-08-14 MED ORDER — ISOSORBIDE DINITRATE 10 MG PO TABS
10.0000 mg | ORAL_TABLET | Freq: Three times a day (TID) | ORAL | Status: DC
  Administered 2015-08-14 – 2015-08-22 (×24): 10 mg via ORAL
  Filled 2015-08-14 (×24): qty 1

## 2015-08-14 MED ORDER — VITAMIN B-1 100 MG PO TABS
100.0000 mg | ORAL_TABLET | Freq: Every day | ORAL | Status: DC
  Administered 2015-08-14 – 2015-08-22 (×9): 100 mg via ORAL
  Filled 2015-08-14 (×10): qty 1

## 2015-08-14 MED ORDER — HYDRALAZINE HCL 25 MG PO TABS
25.0000 mg | ORAL_TABLET | Freq: Three times a day (TID) | ORAL | Status: DC
  Administered 2015-08-14 – 2015-08-16 (×5): 25 mg via ORAL
  Filled 2015-08-14 (×5): qty 1

## 2015-08-14 NOTE — Progress Notes (Signed)
NP notified of patients increasing confusion, this RN was able to re-orient the patient earlier in the shift, no longer able to do so. Patient not able to verbalize that he is in [facility] or why he is here. Continuously states that he needs to get out of here, seems to be talking to people that are not in the room. Oxygen saturations have been stable overnight and patient has kept nasal cannula on. Will continue to monitor closely.

## 2015-08-14 NOTE — Progress Notes (Signed)
Patient Name: [name] Date of Encounter: 08/14/2015  Principal Problem:   Acute respiratory failure with hypoxia [facility]) Active Problems:   Obstructive sleep apnea   Coronary atherosclerosis of native coronary artery   PAF (paroxysmal atrial fibrillation) (HCC)   Hypothyroidism   COPD (chronic obstructive pulmonary disease) (HCC)   Enterocutaneous fistula   Diabetes mellitus type 2, controlled ([place])   A-fib ([place])   Acute encephalopathy   Stroke (cerebrum) [facility])   Primary Cardiologist: Dr Angelena Form CHF: Dr Aundra Dubin  Patient Profile: 69 yo male w/ hx CABG 2001, AFib (no anticoag 2nd ETOH), COPD, OSA, CKD III, D-CHF, L-CEA, hernia>>resection>>c/b enterocutaneous fistula (01/2014)  Admitted 10/31 pm w/ SOB. Cards saw 11/02 for CHF  SUBJECTIVE: Awake but confused, does not know where he is  OBJECTIVE Filed Vitals:   08/14/15 0400 08/14/15 0616 08/14/15 0831 08/14/15 1021  BP: 147/79  150/86   Pulse: 91  95   Temp: 97.8 F (36.6 C)     TempSrc: Oral     Resp: 15     Weight:  254 lb 6.4 oz (115.395 kg)    SpO2: 95%   91%    Intake/Output Summary (Last 24 hours) at 08/14/15 1350 Last data filed at 08/14/15 1300  Gross per 24 hour  Intake    600 ml  Output    501 ml  Net     99 ml   Filed Weights   08/12/15 0143 08/13/15 0400 08/14/15 0616  Weight: 253 lb (114.76 kg) 255 lb 9.6 oz (115.939 kg) 254 lb 6.4 oz (115.395 kg)    PHYSICAL EXAM General: Well developed, well nourished, male in no acute distress. Head: Normocephalic, atraumatic.  Neck: Supple without bruits, JVD 10 cm. Lungs:  Resp regular and unlabored, decreased BS bases w/ rales. Heart: Irreg irreg, S1, S2, no S3, S4, soft murmur; no rub. Abdomen: Soft, non-tender, non-distended, BS + x 4.  Extremities: No clubbing, cyanosis, + edema, degree unclear as legs are wrapped.  Neuro: Alert and oriented X 1. Moves all extremities spontaneously. Psych: Normal affect.  LABS: CBC: Recent Labs   08/12/15 0308 08/13/15 0745  WBC 9.4 5.0  HGB 11.2* 10.1*  HCT 37.5* 33.1*  MCV 99.7 100.6*  PLT 106* 92*   INR: Recent Labs  08/12/15 1555  INR 6.38   Basic Metabolic Panel: Recent Labs  08/13/15 0745 08/14/15 0456  NA 135 135  K 3.9 4.6  CL 95* 95*  CO2 34* 30  GLUCOSE 155* 176*  BUN 39* 47*  CREATININE 1.67* 2.09*  CALCIUM 9.0 8.9   Cardiac Enzymes: Recent Labs  08/12/15 0308 08/12/15 0726 08/12/15 1255  TROPONINI 0.09* 0.08* 0.07*    Recent Labs  08/11/15 1616  TROPIPOC 0.02   BNP:  B NATRIURETIC PEPTIDE  Date/Time Value Ref Range Status  08/11/2015 04:16 PM 299.1* 0.0 - 100.0 pg/mL Final  07/01/2015 04:27 PM 331.3* 0.0 - 100.0 pg/mL Final   Hemoglobin A1C: Recent Labs  08/13/15 0745  HGBA1C 6.8*   Fasting Lipid Panel: Recent Labs  08/14/15 0456  CHOL 108  HDL 26*  LDLCALC 65  TRIG 87  CHOLHDL 4.2   Thyroid Function Tests: Recent Labs  08/12/15 0308  TSH 0.976   TELE:  Atrial fib, rate generally controlled      Radiology/Studies: Dg Chest 2 View 08/11/2015  CLINICAL DATA:  Shortness of breath. Altered mental status. Choked while eating this morning. Possible aspiration. EXAM: CHEST  2 VIEW COMPARISON:  07/04/2015. FINDINGS: Stable enlarged cardiac silhouette and post CABG changes. Interval mild patchy and linear density at the right lung base. Stable prominence of the interstitial markings. Mild increase in prominence of the pulmonary vasculature. Breathing motion blurring on the lateral view. IMPRESSION: 1. Atelectasis at the right lung base. Superimposed aspiration is unlikely based on the current appearance. 2. Stable cardiomegaly and mild chronic interstitial lung disease with interval mild pulmonary vascular congestion. Electronically Signed   By: Claudie Revering M.D.   On: 08/11/2015 17:07   Ct Head Wo Contrast 08/11/2015  CLINICAL DATA:  Initial encounter for 69 YOM walking using his walker and fell backwards and hit his head today  08/11/15. EXAM: CT HEAD WITHOUT CONTRAST CT CERVICAL SPINE WITHOUT CONTRAST TECHNIQUE: Multidetector CT imaging of the head and cervical spine was performed following the standard protocol without intravenous contrast. Multiplanar CT image reconstructions of the cervical spine were also generated. COMPARISON:  Head CT of 05/26/2015. No prior cervical spine imaging. FINDINGS: CT HEAD FINDINGS Sinuses/Soft tissues: Mild left parietal scalp soft tissue thickening on image/series 67/4. Right maxillary sinus mucous retention cyst or polyp. No skull fracture. Clear mastoid air cells. Intracranial: Cerebral atrophy. Moderate low density in the periventricular white matter likely related to small vessel disease. Left cerebellar remote infarct. No mass lesion, hemorrhage, hydrocephalus, acute infarct, intra-axial, or extra-axial fluid collection. CT CERVICAL SPINE FINDINGS Spinal visualization through the bottom of T4. Prevertebral soft tissues are within normal limits. Dense carotid atherosclerosis bilaterally. No apical pneumothorax. Advanced spondylosis. Left worse than right neural foraminal narrowing at multiple levels. Mild central canal stenosis, most significant at C3-4. Prior median sternotomy. Maintenance of vertebral body height. Trace C3-4 retrolisthesis. From C7 inferiorly are mildly degraded secondary to patient body habitus. Facets are well-aligned. Coronal reformats demonstrate a normal C1-C2 articulation. IMPRESSION: 1. Mild left parietal scalp soft tissue swelling. No acute intracranial abnormality. 2. Advanced cervical spondylosis. Trace C3-4 is favored to be degenerative. Otherwise, no acute findings in the cervical spine identified. 3.  Cerebral atrophy and small vessel ischemic change. Electronically Signed   By: Abigail Miyamoto M.D.   On: 08/11/2015 16:50   Ct Angio Chest Pe W/cm &/or Wo Cm 08/11/2015  CLINICAL DATA:  Worsening dyspnea.  Altered mental status. EXAM: CT ANGIOGRAPHY CHEST WITH CONTRAST  TECHNIQUE: Multidetector CT imaging of the chest was performed using the standard protocol during bolus administration of intravenous contrast. Multiplanar CT image reconstructions and MIPs were obtained to evaluate the vascular anatomy. CONTRAST:  13mL OMNIPAQUE IOHEXOL 350 MG/ML SOLN COMPARISON:  08/28/2014 FINDINGS: Cardiovascular: There is good opacification of the pulmonary arteries. There is no pulmonary embolism. The thoracic aorta is normal in caliber and intact. There is extensive calcified coronary artery plaque. There are multiple coronary artery bypass grafts. Lungs: There are multifocal confluent airspace opacities, central predominant. Considerations include alveolar edema, infectious infiltrate. Hemorrhage or neoplasm less likely but not entirely excluded. Central airways: Patent Effusions: None Lymphadenopathy: Mildly prominent hilar and mediastinal nodes, more likely reactive Esophagus: Unremarkable Upper abdomen: Unremarkable Musculoskeletal: No significant abnormality except for moderate thoracic degenerative disc disease, kyphosis, and prior sternotomy. Review of the MIP images confirms the above findings. IMPRESSION: 1. Negative for acute pulmonary embolism 2. Confluent airspace opacities bilaterally, right worse than left. Infectious infiltrate is a leading consideration. Alveolar edema less likely but not excluded. There also are mildly prominent hilar and mediastinal nodes which may be reactive. Electronically Signed   By: Andreas Newport M.D.   On: 08/11/2015 23:21  Ct Cervical Spine Wo Contrast 08/11/2015  CLINICAL DATA:  Initial encounter for 69 YOM walking using his walker and fell backwards and hit his head today 08/11/15. EXAM: CT HEAD WITHOUT CONTRAST CT CERVICAL SPINE WITHOUT CONTRAST TECHNIQUE: Multidetector CT imaging of the head and cervical spine was performed following the standard protocol without intravenous contrast. Multiplanar CT image reconstructions of the cervical  spine were also generated. COMPARISON:  Head CT of 05/26/2015. No prior cervical spine imaging. FINDINGS: CT HEAD FINDINGS Sinuses/Soft tissues: Mild left parietal scalp soft tissue thickening on image/series 67/4. Right maxillary sinus mucous retention cyst or polyp. No skull fracture. Clear mastoid air cells. Intracranial: Cerebral atrophy. Moderate low density in the periventricular white matter likely related to small vessel disease. Left cerebellar remote infarct. No mass lesion, hemorrhage, hydrocephalus, acute infarct, intra-axial, or extra-axial fluid collection. CT CERVICAL SPINE FINDINGS Spinal visualization through the bottom of T4. Prevertebral soft tissues are within normal limits. Dense carotid atherosclerosis bilaterally. No apical pneumothorax. Advanced spondylosis. Left worse than right neural foraminal narrowing at multiple levels. Mild central canal stenosis, most significant at C3-4. Prior median sternotomy. Maintenance of vertebral body height. Trace C3-4 retrolisthesis. From C7 inferiorly are mildly degraded secondary to patient body habitus. Facets are well-aligned. Coronal reformats demonstrate a normal C1-C2 articulation. IMPRESSION: 1. Mild left parietal scalp soft tissue swelling. No acute intracranial abnormality. 2. Advanced cervical spondylosis. Trace C3-4 is favored to be degenerative. Otherwise, no acute findings in the cervical spine identified. 3.  Cerebral atrophy and small vessel ischemic change. Electronically Signed   By: Abigail Miyamoto M.D.   On: 08/11/2015 16:50   Mr Brain Wo Contrast 08/12/2015  CLINICAL DATA:  Initial evaluation for syncope. EXAM: MRI HEAD WITHOUT CONTRAST TECHNIQUE: Multiplanar, multiecho pulse sequences of the brain and surrounding structures were obtained without intravenous contrast. COMPARISON:  Prior CT from on 08/11/2015. FINDINGS: Diffuse prominence of the CSF containing spaces is compatible with generalized age-related cerebral atrophy. Patchy  T2/FLAIR hyperintensity within the periventricular and deep white matter both cerebral hemispheres most likely related chronic small vessel ischemic disease. Encephalomalacia within the left cerebellar hemisphere consistent with remote infarct. Associated chronic hemosiderin staining present within this region. Probable remote infarct within the anteromedial left frontal lobe as well. There is a possible tiny punctate 4 mm focus of high signal intensity on DWI sequence involving the cortical gray matter of the posterior right frontal region (series 3, image 31 on axial sequence, series 5, image 14 on coronal sequence). Finding may reflect a tiny acute ischemic infarct. Corresponding signal loss not definitely seen on ADC map, although this is felt to be too small to be resolved. No other definite acute intracranial infarct. Abnormal flow void within the distal left vertebral artery, which may related to focal plaque or possibly chronic dissection (series 8, image 3), similar to prior. Major intracranial vascular flow voids otherwise maintained. The right vertebral artery is diminutive. No acute intracranial hemorrhage. No mass lesion, midline shift, or mass effect. No hydrocephalus. No extra-axial fluid collection. Craniocervical junction grossly within normal limits. Degenerative spondylolysis noted within the partially visualized upper cervical spine. Pituitary gland grossly normal. No acute abnormality about the orbits. Sequela prior lens extraction present on the left. Mild mucosal thickening within the maxillary sinuses and ethmoidal air cells. Paranasal sinuses are otherwise clear. Minimal opacity within the right mastoid air cells. Inner ear structures normal. Bone marrow signal intensity within normal limits. No scalp soft tissue abnormality. IMPRESSION: 1. Question 4 mm punctate focus of  high signal intensity on DWI sequence within the posterior right frontal lobe. Finding favored to reflect artifact,  although possible tiny acute ischemic infarct is not entirely excluded, and could be considered in the correct clinical setting. 2. No other acute intracranial process. 3. 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 4. Abnormal flow void within the distal left vertebral artery, which may be related to focal plaque or possibly chronic dissection. This is similar relative to prior study. 5. Age-related cerebral atrophy with mild to moderate chronic small vessel ischemic disease. Electronically Signed   By: Jeannine Boga M.D.   On: 08/12/2015 02:40   Dg Abd Acute W/chest 08/12/2015  CLINICAL DATA:  Episode of nausea and vomiting earlier, no abdominal pain, history of bowel resection and breast malignancy. EXAM: DG ABDOMEN ACUTE W/ 1V CHEST COMPARISON:  Portable chest x-ray of August 11, 2015 and chest CT scan of the same day FINDINGS: The lungs are borderline hypoinflated. Confluent alveolar opacities are present at both lung bases greater on the right than on the left. A trace of pleural fluid on the right is suspected. The cardiac silhouette is mildly enlarged. The central pulmonary vascularity is engorged and indistinct. The patient has undergone previous CABG. The stool burden within the colon is mildly increased. There is a moderate amount of gas within loops of the sigmoid colon. There is no small or large bowel obstructive pattern and no evidence of perforation. There is degenerative change of the lower lumbar spine. There is contrast within the urinary bladder from the earlier CT scan. There is splenic arterial calcification. IMPRESSION: 1. Patchy airspace opacities bilaterally consistent with pneumonia. A small amount of pleural fluid is likely present on the right. 2. CHF with mild pulmonary vascular congestion. 3. No acute intra-abdominal abnormality is observed. Increased colonic stool burden may reflect constipation in the appropriate clinical setting.  Electronically Signed   By: David  Martinique M.D.   On: 08/12/2015 08:16   Current Medications:  . allopurinol  300 mg Oral Daily  . aspirin EC  325 mg Oral Daily  . atorvastatin  20 mg Oral Daily  . budesonide (PULMICORT) nebulizer solution  0.25 mg Nebulization BID  . carvedilol  6.25 mg Oral BID WC  . colchicine  0.6 mg Oral Daily  . enoxaparin (LOVENOX) injection  40 mg Subcutaneous Q24H  . FLUoxetine  20 mg Oral Daily  . folic acid  1 mg Oral Daily  . gabapentin  300 mg Oral BID  . insulin aspart  0-9 Units Subcutaneous TID WC  . ipratropium-albuterol  3 mL Nebulization TID  . levothyroxine  187 mcg Oral QAC breakfast  . magnesium oxide  400 mg Oral BID  . pantoprazole  40 mg Oral Daily  . piperacillin-tazobactam (ZOSYN)  IV  3.375 g Intravenous 3 times per day  . potassium chloride  20 mEq Oral QHS  . potassium chloride SA  40 mEq Oral Daily  . tamoxifen  20 mg Oral QHS  . thiamine  100 mg Oral Daily  . venlafaxine XR  37.5 mg Oral Daily      ASSESSMENT AND PLAN: 1. Acute Respiratory Failure w/ Hypoxia:  - likely multifactorial from probable Aspiration PNA and acute diastolic CHF.   2. PNA:  - on antibiotics. Management per IM.   3. A/C Diastolic CHF:  - EF on echo 06/2015 was normal at 55-60%.  - His weight at OV with Richardson Dopp, 07/22/15, was 242 lb. His weight  today is 254 lb.  - Bilateral LEE noted on physical exam, wrapped.  - BNP at time of admit was elevated at 299. - Renal function worsened with diuresis, IV lasix d/c'd yesterday - continue to monitor renal function closely. Low sodium diet.  - Check daily weights. Strict I/Os, but may not be accurate as no UOP recorded 7a-7p yesterday - est CVP on echo 06/12/2015 was 8  4. Chronic Atrial Fibrillation:  - rate is well controlled on Coreg.  - Per clinic notes, no anticoagulation due to alcohol abuse and h/o falls. Continue high dose ASA.  - CHA2DS2-VASc Score and unadjusted Ischemic Stroke Rate (% per year)  is equal to 9.7 % stroke rate/year from a score of 6  5. CAD:  - h/o CABG in 2001.  - NST in 2015 was negative for ischemia.  - He denies CP. Continue ASA, statin and BB.   6. Acute encephalopathy:  - MRI of brain performed and showed punctate infarct in the right frontal lobe. Seen by neurology today.  - It was noted, "MRI findings of stroke are incidental in nature and likely not the etiology of his acute encephalopathy. Suspect this is more related to hypoxia and respiratory distress in the setting of pneumonia".  - Their recommendations are for continuation of high dose ASA given his afib and h/o recent falls and EtOH use, making him a poor candidate for anticoagulation. Neurology has signed off.  Principal Problem:   Acute respiratory failure with hypoxia (HCC) Active Problems:   Obstructive sleep apnea   Coronary atherosclerosis of native coronary artery   PAF (paroxysmal atrial fibrillation) (HCC)   Hypothyroidism   COPD (chronic obstructive pulmonary disease) (HCC)   Enterocutaneous fistula   Diabetes mellitus type 2, controlled ([place])   A-fib (HCC)   Acute encephalopathy   Stroke (cerebrum) (HCC)   Signed, Loribeth Katich , PA-C 1:50 PM 08/14/2015

## 2015-08-14 NOTE — Evaluation (Signed)
Occupational Therapy Evaluation Patient Details Name: [name] MRN: [id number] DOB: Jun 07, 1946 Today's Date: 08/14/2015    History of Present Illness 69 yo male who looks older than stated age has new acute respiratory failure after episode of sepsis from cellulitis and new R frontal punctate infarct.  PHx:  COPD, HTN, PNA, falls   Clinical Impression   Pt admitted with the above diagnosis and has the deficits listed below. Pt would benefit from cont OT to increase independence and participation in basic adls so he can eventually return home with daughter after rehab.  Will update goals to match pt's previous level of functioning if needed after talking to family to see what he could and could not do PTA.    Follow Up Recommendations  SNF    Equipment Recommendations  Other (comment) (TBD. pt poor historian)    Recommendations for Other Services       Precautions / Restrictions Precautions Precautions: Fall Precaution Comments: multiple lines including telemetry, condom cath, IV, O2.   Restrictions Weight Bearing Restrictions: No      Mobility Bed Mobility Overal bed mobility: Needs Assistance Bed Mobility: Supine to Sit     Supine to sit: Max assist     General bed mobility comments: Pt very confused and wanting to get out of bed impulsively therefore +2 utilized.  Transfers Overall transfer level: Needs assistance Equipment used: 2 person hand held assist Transfers: Sit to/from [facility] Sit to Stand: Mod assist;+2 physical assistance Stand pivot transfers: Max assist;+2 physical assistance       General transfer comment: pt did not need a lot of physical assist EXCEPT for wanting to sit 1/2 way through the transfer.  When constantly encouraged to stand and keep going pt did 60% of work.    Balance Overall balance assessment: Needs assistance Sitting-balance support: Feet supported Sitting balance-Leahy Scale: Poor     Standing  balance support: Bilateral upper extremity supported;During functional activity Standing balance-Leahy Scale: Zero Standing balance comment: PT had to have extensive outside support to remain standing.                            ADL Overall ADL's : Needs assistance/impaired Eating/Feeding: Moderate assistance;Sitting Eating/Feeding Details (indicate cue type and reason): assist for attn and cognitive aspects of feeding.  Has physical ability to feed self.   Grooming: Maximal assistance;Wash/dry face;Wash/dry hands;Sitting Grooming Details (indicate cue type and reason): again...limited by cognition. Upper Body Bathing: Moderate assistance;Sitting Upper Body Bathing Details (indicate cue type and reason): cues to stay on task. Lower Body Bathing: Maximal assistance;+2 for physical assistance;Sit to/from stand Lower Body Bathing Details (indicate cue type and reason): Pt with difficulty standing although he thinks he can stand without assist.  Pt's obesity makes if very difficulty to bathe the LEs. Upper Body Dressing : Moderate assistance;Sitting   Lower Body Dressing: Maximal assistance;+2 for physical assistance Lower Body Dressing Details (indicate cue type and reason): difficulty standing, starting pants over feet and accessing feet.  Pt not cognitively sound enough to introduce AE. Toilet Transfer: Maximal assistance;+2 for safety/equipment;Stand-pivot;BSC Toilet Transfer Details (indicate cue type and reason): Pt requires +2 and constant cues to pivot feet, reach back for chair etc. Toileting- Clothing Manipulation and Hygiene: Total assistance;+2 for physical assistance;Sit to/from stand Toileting - Clothing Manipulation Details (indicate cue type and reason): Pt unable to let go of walker long enough to attempt to clean self.  Pt unaware  he had urinated all over his bed and was laying in it.  Pt moved to chair to change bed linens.     Functional mobility during ADLs:  Moderate assistance;+2 for physical assistance;+2 for safety/equipment;Cueing for safety General ADL Comments: Pt may be able to participate better in adls if his cognition and metal status improve.  Right now, pt not at all oriented and therefore participation was limited.     Vision Vision Assessment?: No apparent visual deficits   Perception Perception Perception Tested?: No   Praxis Praxis Praxis tested?: Not tested Praxis-Other Comments: unable to test.    Pertinent Vitals/Pain Pain Assessment: No/denies pain     Hand Dominance Right   Extremity/Trunk Assessment Upper Extremity Assessment Upper Extremity Assessment: Generalized weakness   Lower Extremity Assessment Lower Extremity Assessment: Defer to PT evaluation   Cervical / Trunk Assessment Cervical / Trunk Assessment: Normal   Communication Communication Communication: No difficulties   Cognition Arousal/Alertness: Awake/alert Behavior During Therapy: Impulsive Overall Cognitive Status: No family/caregiver present to determine baseline cognitive functioning Area of Impairment: Orientation;Attention;Memory;Following commands;Safety/judgement;Awareness;Problem solving Orientation Level: Disoriented to;Place;Time;Situation Current Attention Level: Focused Memory: Decreased recall of precautions;Decreased short-term memory Following Commands: Follows one step commands inconsistently Safety/Judgement: Decreased awareness of safety;Decreased awareness of deficits Awareness: Intellectual Problem Solving: Slow processing;Decreased initiation;Difficulty sequencing General Comments: Pt very confused.  Thinks he is in his car and just had a car wreck.  Unable to be redirected.   General Comments       Exercises       Shoulder Instructions      Home Living Family/patient expects to be discharged to:: Skilled nursing facility Living Arrangements: Children Available Help at Discharge: Family;Available 24  hours/day Type of Home: House Home Access: Stairs to enter [employer] of Steps: 4 Entrance Stairs-Rails: Right;Left;Can reach both Home Layout: One level     Bathroom Shower/Tub: Walk-in shower;Door   [employer] Toilet: Handicapped height Bathroom Accessibility: Yes How Accessible: Accessible via walker Home Equipment: Staples - 2 wheels;Bedside commode;Shower seat   Additional Comments: Has confusion about his abiltiy to walk and stated to PT, " I can make it to the door" in spite of clearly leaning backward in standing.    Lives With: Daughter    Prior Functioning/Environment Level of Independence: Needs assistance  Gait / Transfers Assistance Needed: pt ambulates with RW limited distance ADL's / Homemaking Assistance Needed: daughter home 24/7 per pt   Comments: not able to trust PLOF from pt as he is unrealistic about his level of debility upon eval    OT Diagnosis: Generalized weakness;Cognitive deficits   OT Problem List: Decreased strength;Decreased activity tolerance;Impaired balance (sitting and/or standing);Decreased cognition;Decreased safety awareness;Decreased knowledge of use of DME or AE;Decreased knowledge of precautions;Obesity   OT Treatment/Interventions: Self-care/ADL training;Therapeutic activities;DME and/or AE instruction    OT Goals(Current goals can be found in the care plan section) Acute Rehab OT Goals Patient Stated Goal: none stated. OT Goal Formulation: Patient unable to participate in goal setting Time For Goal Achievement: 08/28/15 Potential to Achieve Goals: Fair ADL Goals Pt Will Perform Grooming: sitting;with supervision Pt Will Perform Lower Body Bathing: with mod assist;with adaptive equipment;sit to/from stand Pt Will Perform Lower Body Dressing: with adaptive equipment;with mod assist;sit to/from stand Additional ADL Goal #1: Pt will follow 90% of simple commands to increase participation and awareness in therapy.  OT  Frequency: Min 2X/week   Barriers to D/C: Decreased caregiver support  only has assist of one at home per chart.  Co-evaluation              End of Session Equipment Utilized During Treatment: Oxygen Nurse Communication: Mobility status  Activity Tolerance: Patient limited by fatigue Patient left: in chair;with call bell/phone within reach;with chair alarm set   Time: 1120-1140 OT Time Calculation (min): 20 min Charges:  OT General Charges $OT Visit: 1 Procedure OT Evaluation $Initial OT Evaluation Tier I: 1 Procedure G-Codes:    Glenford Peers 09-02-2015, 11:56 AM  [phone]

## 2015-08-14 NOTE — Progress Notes (Signed)
PT refuses to wear CPAP.

## 2015-08-14 NOTE — NC FL2 (Signed)
[place] LEVEL OF CARE SCREENING TOOL     IDENTIFICATION  Patient Name: [name] Birthdate: 1946-06-04 Sex: male Admission Date (Current Location): 08/11/2015  [place] and [place] Number: [employer] and Address:  [facility] [place], [place] [address] [place], [place] [address]      Provider Number: [id number]  Attending Physician Name and Address:  Jonetta Osgood, MD  Relative Name and Phone Number:       Current Level of Care: Hospital Recommended Level of Care: [place] Prior Approval Number:    Date Approved/Denied:   PASRR Number: [id number]  Discharge Plan: SNF    Current Diagnoses: Patient Active Problem List   Diagnosis Date Noted  . Stroke (cerebrum) ([place])   . Acute encephalopathy 08/12/2015  . Hypoxemia requiring supplemental oxygen 08/11/2015  . A-fib ([place]) 08/11/2015  . Acute respiratory failure with hypoxia ([place]) 08/11/2015  . Sepsis ([place]) 07/01/2015  . Sepsis due to cellulitis ([place]) 07/01/2015  . SOB (shortness of breath)   . Acute on chronic diastolic CHF (congestive heart failure), NYHA class 3 ([place])   . Encephalopathy, metabolic   . Hypomagnesemia 05/27/2015  . History of male breast cancer, s/p R mastectomy Jan 2016 05/27/2015  . General weakness 05/27/2015  . Generalized weakness 05/26/2015  . Hypokalemia 10/03/2014  . Congestive heart failure ([place]) 10/02/2014  . Lower extremity pain, left 10/02/2014  . Diabetes mellitus type 2, controlled ([place]) 10/02/2014  . Enterocutaneous fistula   . Hypertension 08/30/2014  . COPD (chronic obstructive pulmonary disease) ([place]) 08/30/2014  . Intracerebral hemorrhage ([place]) 08/01/2014  . Osteoarthritis of both hands 07/30/2014  . Thrush, oral 07/28/2014  . Debility 07/25/2014  . Diabetic polyneuropathy associated with type 2 diabetes mellitus ([place]) 07/25/2014  . Gout flare 07/24/2014  . Pre-operative cardiovascular examination 07/15/2014  .  Family history of malignant neoplasm of gastrointestinal tract 06/12/2014  . Panniculitis 04/14/2013  . Seasonal allergies 12/20/2012  . L1 vertebral fracture ([place]) 09/07/2012  . Hypothyroidism 08/30/2012  . ETOH abuse   . PAF (paroxysmal atrial fibrillation) ([place])   . Chronic diastolic heart failure ([place])   . DM (diabetes mellitus) type II uncontrolled, periph vascular disorder ([place]) 06/20/2009  . Obstructive sleep apnea 06/20/2009  . CAROTID STENOSIS 06/20/2009  . CHRONIC OBSTRUCTIVE PULMONARY DISEASE 06/20/2009  . Coronary atherosclerosis of native coronary artery 06/20/2009  . Hyperlipidemia   . Hypertensive heart disease     Orientation ACTIVITIES/SOCIAL BLADDER RESPIRATION    Self, Time  Active Incontinent, External catheter O2 (As needed) (3L)  BEHAVIORAL SYMPTOMS/MOOD NEUROLOGICAL BOWEL NUTRITION STATUS  Other (Comment) (None)   Continent  (Heart healthy carb modified)  PHYSICIAN VISITS COMMUNICATION OF NEEDS Height & Weight Skin    Verbally   254 lbs. Surgical wounds (Incisions of abdomen. Patient has abrasions on chin. Left toenail removed.)          AMBULATORY STATUS RESPIRATION    Assist extensive O2 (As needed) (3L)      Personal Care Assistance Level of Assistance  Bathing, Dressing Bathing Assistance: Maximum assistance   Dressing Assistance: Maximum assistance      Functional Limitations Info                SPECIAL CARE FACTORS FREQUENCY  PT (By licensed PT), OT (By licensed OT), Speech therapy     PT Frequency: 5x/week OT Frequency: 5x/week     Speech Therapy Frequency: 5x/week     Additional Factors Info  Insulin Sliding Scale, Allergies   Allergies Info: Erythromycin   Insulin Sliding Scale Info: 3x per day       Current Medications (08/14/2015): Current Facility-Administered Medications  Medication Dose Route Frequency Provider Last Rate Last Dose  . albuterol (PROVENTIL) (2.5 MG/3ML) 0.083% nebulizer solution 2.5 mg  2.5 mg  Nebulization Q4H PRN Jonetta Osgood, MD      . allopurinol (ZYLOPRIM) tablet 300 mg  300 mg Oral Daily Rise Patience, MD   300 mg at 08/14/15 0944  . aspirin EC tablet 325 mg  325 mg Oral Daily Rise Patience, MD   325 mg at 08/14/15 0943  . atorvastatin (LIPITOR) tablet 20 mg  20 mg Oral Daily Rise Patience, MD   20 mg at 08/14/15 0945  . budesonide (PULMICORT) nebulizer solution 0.25 mg  0.25 mg Nebulization BID Rise Patience, MD   0.25 mg at 08/14/15 1020  . carvedilol (COREG) tablet 6.25 mg  6.25 mg Oral BID WC Rise Patience, MD   6.25 mg at 08/14/15 0831  . colchicine tablet 0.6 mg  0.6 mg Oral Daily Rise Patience, MD   0.6 mg at 08/14/15 0945  . enoxaparin (LOVENOX) injection 40 mg  40 mg Subcutaneous Q24H Rise Patience, MD   40 mg at 08/14/15 0946  . FLUoxetine (PROZAC) capsule 20 mg  20 mg Oral Daily Rise Patience, MD   20 mg at 08/14/15 0944  . folic acid (FOLVITE) tablet 1 mg  1 mg Oral Daily Rise Patience, MD   1 mg at 08/14/15 0945  . gabapentin (NEURONTIN) capsule 300 mg  300 mg Oral BID Rise Patience, MD   300 mg at 08/14/15 0945  . insulin aspart (novoLOG) injection 0-9 Units  0-9 Units Subcutaneous TID WC Rise Patience, MD   2 Units at 08/14/15 [phone]  . ipratropium-albuterol (DUONEB) 0.5-2.5 (3) MG/3ML nebulizer solution 3 mL  3 mL Nebulization TID Jonetta Osgood, MD   3 mL at 08/14/15 1020  . levothyroxine (SYNTHROID, LEVOTHROID) tablet 187 mcg  187 mcg Oral QAC breakfast Rise Patience, MD   187 mcg at 08/14/15 [phone]  . magnesium oxide (MAG-OX) tablet 400 mg  400 mg Oral BID Rise Patience, MD   400 mg at 08/14/15 0945  . oxyCODONE (Oxy IR/ROXICODONE) immediate release tablet 10 mg  10 mg Oral Q8H PRN Rise Patience, MD   10 mg at 08/13/15 2317  . pantoprazole (PROTONIX) EC tablet 40 mg  40 mg Oral Daily Rise Patience, MD   40 mg at 08/14/15 0945  . piperacillin-tazobactam (ZOSYN) IVPB 3.375 g   3.375 g Intravenous 3 times per day Erenest Blank, RPH   3.375 g at 08/14/15 0517  . potassium chloride SA (K-DUR,KLOR-CON) CR tablet 20 mEq  20 mEq Oral QHS Rise Patience, MD   20 mEq at 08/13/15 2223  . potassium chloride SA (K-DUR,KLOR-CON) CR tablet 40 mEq  40 mEq Oral Daily Rise Patience, MD   40 mEq at 08/14/15 0945  . senna-docusate (Senokot-S) tablet 1 tablet  1 tablet Oral QHS PRN Rise Patience, MD      . tamoxifen (NOLVADEX) tablet 20 mg  20 mg Oral QHS Rise Patience, MD   20 mg at 08/13/15 2223  . thiamine (VITAMIN B-1) tablet 100 mg  100 mg Oral Daily Corky Sox, MD   100 mg at 08/14/15 0945  . venlafaxine  XR (EFFEXOR-XR) 24 hr capsule 37.5 mg  37.5 mg Oral Daily Rise Patience, MD   37.5 mg at 08/14/15 0946   Do not use this list as official medication orders. Please verify with discharge summary.  Discharge Medications:   Medication List    ASK your doctor about these medications        albuterol-ipratropium 18-103 MCG/ACT inhaler  Commonly known as:  COMBIVENT  Inhale 2 puffs into the lungs 2 (two) times daily.     allopurinol 300 MG tablet  Commonly known as:  ZYLOPRIM  Take 1 tablet (300 mg total) by mouth daily.     aspirin 325 MG EC tablet  Take 1 tablet (325 mg total) by mouth daily.     atorvastatin 20 MG tablet  Commonly known as:  LIPITOR  Take 1 tablet (20 mg total) by mouth daily.     carvedilol 3.125 MG tablet  Commonly known as:  COREG  Take 2 tablets (6.25 mg total) by mouth 2 (two) times daily with a meal.     colchicine 0.6 MG tablet  Take 0.6 mg by mouth daily.     FLUoxetine 20 MG capsule  Commonly known as:  PROZAC  Take 1 capsule (20 mg total) by mouth daily.     fluticasone 110 MCG/ACT inhaler  Commonly known as:  FLOVENT HFA  Inhale 2 puffs into the lungs 2 (two) times daily.     folic acid 1 MG tablet  Commonly known as:  FOLVITE  Take 1 tablet (1 mg total) by mouth daily.     furosemide 80 MG  tablet  Commonly known as:  LASIX  Take 1 tablet (80 mg total) by mouth as directed. take 120 mg in AM and 80 mg in afternoon     furosemide 40 MG tablet  Commonly known as:  LASIX  Take 1 tablet (40 mg total) by mouth daily. Pt take 120 mg in AM and 80 mg in PM     gabapentin 300 MG capsule  Commonly known as:  NEURONTIN  Take 1 capsule (300 mg total) by mouth 2 (two) times daily.     levothyroxine 125 MCG tablet  Commonly known as:  SYNTHROID, LEVOTHROID  Take 1.5 tablets (187 mcg total) by mouth daily before breakfast.     magnesium oxide 400 (241.3 MG) MG tablet  Commonly known as:  MAG-OX  Take 1 tablet (400 mg total) by mouth 2 (two) times daily.     metolazone 2.5 MG tablet  Commonly known as:  ZAROXOLYN  TAKE 1 TABLET BY MOUTH DAILY 30 MINUTES PRIOR TO LASIX DOSAGE     neomycin-bacitracin-polymyxin Oint  Commonly known as:  NEOSPORIN  Apply 1 application topically 2 (two) times daily. To left 3rd toe     omeprazole 20 MG capsule  Commonly known as:  PRILOSEC  Take 1 capsule (20 mg total) by mouth daily.     Oxycodone HCl 10 MG Tabs  Take 10 mg by mouth 3 (three) times daily as needed (pain).     potassium chloride SA 20 MEQ tablet  Commonly known as:  KLOR-CON M20  Take 2 tablets (40 mEq total) by mouth as directed. 40 meq in the AM, 20 meq in the PM     tamoxifen 20 MG tablet  Commonly known as:  NOLVADEX  Take 1 tablet (20 mg total) by mouth daily.     venlafaxine XR 37.5 MG 24 hr capsule  Commonly known as:  EFFEXOR-XR  TAKE ONE CAPSULE BY MOUTH DAILY        Relevant Imaging Results:  Relevant Lab Results:  Recent Labs    Additional Information    Rigoberto Noel, LCSW

## 2015-08-14 NOTE — Progress Notes (Signed)
SLP Cancellation Note  Patient Details Name: [name] MRN: [id number] DOB: Apr 30, 1946   Cancelled treatment:       Reason Eval/Treat Not Completed: Patient at procedure or test/unavailable   Germain Osgood, M.A. CCC-SLP [phone]  Germain Osgood 08/14/2015, 3:26 PM

## 2015-08-14 NOTE — Clinical Social Work Note (Signed)
Clinical Social Work Assessment  Patient Details  Name: [name] MRN: [id number] Date of Birth: October 08, 1946  Date of referral:  08/14/15               Reason for consult:  Discharge Planning, Facility Placement                Permission sought to share information with:  Family Supports, Chartered certified accountant granted to share information::  No (Paitent is confused)  Name::     Banker::  SNFs  Relationship::     Contact Information:     Housing/Transportation Living arrangements for the past 2 months:  Single Family Home Source of Information:  Adult Children Patient Interpreter Needed:  None Criminal Activity/Legal Involvement Pertinent to Current Situation/Hospitalization:  No - Comment as needed Significant Relationships:  Adult Children Lives with:  Adult Children Do you feel safe going back to the place where you live?  Yes Need for family participation in patient care:  Yes (Comment)  Care giving concerns:  Patient's daughter agrees that the patient is too weak to return home at this time and needs continued rehab.   Social Worker assessment / plan:  CSW spoke with patient's daughter to complete assessment as patient is confused. Daughter states that the patient lives at home with her. The patient has been to [facility] in the past the the patient's daughter would like him to go to this facility at discharge. Daughter is wanting the patient to go to CIR if possible. CSW has mentioned this to [facility] but SNF was recommended for the patient. CSW explained SNF search/placement process and answered the daughter's questions. CSW will follow.   Employment status:  Disabled (Comment on whether or not currently receiving Disability), Retired Forensic scientist:    PT Recommendations:  [place] / Referral to community resources:  [place]  Patient/Family's Response to care:  Daughter  states she is happy with the care the patient has received at [facility].   Patient/Family's Understanding of and Emotional Response to Diagnosis, Current Treatment, and Prognosis:  Patient's daughter appears to have fair understanding of reason for admission and diagnosis. She understands that the patient will need continued rehab at discharge.  Emotional Assessment Appearance:  Appears stated age Attitude/Demeanor/Rapport:  Unable to Assess Affect (typically observed):  Unable to Assess Orientation:  Oriented to Self, Oriented to Place Alcohol / Substance use:  Alcohol Use Psych involvement (Current and /or in the community):  No (Comment)  Discharge Needs  Concerns to be addressed:  Discharge Planning Concerns Readmission within the last 30 days:  No Current discharge risk:  Chronically ill, Physical Impairment, Cognitively Impaired Barriers to Discharge:  Continued Medical Work up   [employer] MSW, [place], [place], [phone]

## 2015-08-14 NOTE — Progress Notes (Signed)
*  PRELIMINARY RESULTS* Vascular Ultrasound Carotid Duplex (Doppler) has been completed.  Preliminary findings: Bilateral 40-59% ICA stenosis. Right vertebral antegrade. Left vertebral retrograde, suggesting a proximal stenosis (possibly subclavian steal), however bilateral subclavian artery flow is symmetrical.    Landry Mellow, RDMS, RVT  08/14/2015, 3:54 PM

## 2015-08-14 NOTE — Clinical Social Work Placement (Signed)
   CLINICAL SOCIAL WORK PLACEMENT  NOTE  Date:  08/14/2015  Patient Details  Name: [name] MRN: [id number] Date of Birth: 06-Jun-1946  Clinical Social Work is seeking post-discharge placement for this patient at the [place] level of care (*CSW will initial, date and re-position this form in  chart as items are completed):  Yes   Patient/family provided with [place] Work Department's list of facilities offering this level of care within the geographic area requested by the patient (or if unable, by the patient's family).  Yes   Patient/family informed of their freedom to choose among providers that offer the needed level of care, that participate in Medicare, Medicaid or managed care program needed by the patient, have an available bed and are willing to accept the patient.  Yes   Patient/family informed of [facility]'s ownership interest in [facility] and [facility], as well as of the fact that they are under no obligation to receive care at these facilities.  PASRR submitted to EDS on       PASRR number received on       Existing PASRR number confirmed on 08/14/15     FL2 transmitted to all facilities in geographic area requested by pt/family on       FL2 transmitted to all facilities within larger geographic area on       Patient informed that his/her managed care company has contracts with or will negotiate with certain facilities, including the following:            Patient/family informed of bed offers received.  Patient chooses bed at       Physician recommends and patient chooses bed at      Patient to be transferred to   on  .  Patient to be transferred to facility by       Patient family notified on   of transfer.  Name of family member notified:        PHYSICIAN       Additional Comment:    _______________________________________________ Liz Beach MSW, [place], [place], [phone]

## 2015-08-14 NOTE — Progress Notes (Signed)
PATIENT DETAILS Name: [name] Age: 69 y.o. Sex: male Date of Birth: November 24, 1945 Admit Date: 08/11/2015 Admitting Physician Rise Patience, MD TTS:[id number],QZESP, MD  Subjective: Remains mildly confused-breathing essentially unchanged.  Assessment/Plan: Principal Problem: Acute respiratory failure with hypoxia: Suspect multifactorial-from probable aspiration pneumonia,? Acute diastolic heart failure. Continue empiric antibiotics, hold Lasix given worsening creatinine.  Titrate off oxygen as tolerated-follow  Active Problems: ?Syncope/Pre syncope: In a setting of vomiting-? Vagal. Telemetry-shows A. fib-but no other arrhythmias. Recent echocardiogram on 06/12/15 shows EF around 60-65%. EEG negative for seizure-like activity. Continue to monitor in telemetry.   Probable aspiration pneumonia: Continue empiric Zosyn, will discontinue vancomycin.SLP evaluation pending. Blood cultures negative so far, urine streptococcal antigen negative  Acute diastolic heart failure: Dry weight appears to be around 244 pounds-current weight 254 pounds-negative balance of 2.2 L-given significant increase in creatinine-hold Lasix for today-check renal ultrasound-reassess in a.m. Cardiology following.  Acute encephalopathy:Remains mildly confused-not sure what the etiology is-could be from pneumonia.? Suspect Underlying dementia. Per patient and family last use of EtOH was at least 2-4 months back.  CVA: Suspect more of an incidental finding. Has a history of atrial fibrillation-considered a poor candidate for long-term anticoagulation given history of alcohol use and thrombocytopenia.CHA2DS2-VASc score 6.A1c 6.8, LDL 65. Carotid Doppler pending.Continue with aspirin and statin.  Anemia/thrombocytopenia: Chronic issue, stable for outpatient work up  Minimally elevated troponins: May be demand ischemia or false positive elevation in the setting of chronic kidney disease. No workup  required, continue with aspirin, statin and beta blockers  Atrial fibrillation: See above regarding anticoagulation-rate controlled with Coreg.  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 Chronic small ulceration in the anterior abdominal wall that appears stable  History of right breast cancer: Status post mastectomy, continue tamoxifen.Follow with Dr Lindi Adie  Acute on CKD stage 3: creatinine slightly elevated than his usual baseline. CKD likely secondary to underlying diabetic nephropathy-ARF likely secondary to acute illness/prerenal azotemia/diuretics. Hold Lasix and Follow electrolytes  Essential hypertension:controlled, continue Coreg. Follow BP   Type 2 diabetes: CBGs stable-with SSI. Previously was on oral agents that was discontinued in the past because of hypoglycemia  Hypothyroidism: Continue with Synthroid  COPD: Continue with bronchodilators-this appears stable  Gout: Continue colchicine-appears stable  CAD status post CABG: Continue aspirin, statins and beta blockers  OSA: Related to obesity-continue CPAP  Deconditioning: Suspect underlying failure to thrive syndrome-Will likely require SNF on discharge   Disposition: Remain inpatient-require several more days of hospitalization prior to discharge  Antimicrobial agents  See below  Anti-infectives    Start     Dose/Rate Route Frequency Ordered Stop   08/12/15 1000  vancomycin (VANCOCIN) IVPB 750 mg/150 ml premix  Status:  Discontinued     750 mg 150 mL/hr over 60 Minutes Intravenous Every 12 hours 08/12/15 0215 08/14/15 0906   08/12/15 0230  vancomycin (VANCOCIN) 1,250 mg in sodium chloride 0.9 % 250 mL IVPB     1,250 mg 166.7 mL/hr over 90 Minutes Intravenous  Once 08/12/15 0215 08/12/15 0542   08/12/15 0230  piperacillin-tazobactam (ZOSYN) IVPB 3.375 g     3.375 g 12.5 mL/hr over 240 Minutes Intravenous 3  times per day 08/12/15 0215        DVT Prophylaxis: Prophylactic Lovenox   Code Status: Full code   Family Communication Daughter over the phone  Procedures: None  CONSULTS:  cardiology and neurology  Time spent 30 minutes-Greater than 50% of this time was spent in counseling, explanation of diagnosis, planning of further management, and coordination of care.  MEDICATIONS: Scheduled Meds: . allopurinol  300 mg Oral Daily  . aspirin EC  325 mg Oral Daily  . atorvastatin  20 mg Oral Daily  . budesonide (PULMICORT) nebulizer solution  0.25 mg Nebulization BID  . carvedilol  6.25 mg Oral BID WC  . colchicine  0.6 mg Oral Daily  . enoxaparin (LOVENOX) injection  40 mg Subcutaneous Q24H  . FLUoxetine  20 mg Oral Daily  . folic acid  1 mg Oral Daily  . gabapentin  300 mg Oral BID  . insulin aspart  0-9 Units Subcutaneous TID WC  . ipratropium-albuterol  3 mL Nebulization TID  . levothyroxine  187 mcg Oral QAC breakfast  . magnesium oxide  400 mg Oral BID  . pantoprazole  40 mg Oral Daily  . piperacillin-tazobactam (ZOSYN)  IV  3.375 g Intravenous 3 times per day  . potassium chloride  20 mEq Oral QHS  . potassium chloride SA  40 mEq Oral Daily  . tamoxifen  20 mg Oral QHS  . thiamine  100 mg Oral Daily  . venlafaxine XR  37.5 mg Oral Daily   Continuous Infusions:  PRN Meds:.albuterol, oxyCODONE, senna-docusate    PHYSICAL EXAM: Vital signs in last 24 hours: Filed Vitals:   08/14/15 0400 08/14/15 0616 08/14/15 0831 08/14/15 1021  BP: 147/79  150/86   Pulse: 91  95   Temp: 97.8 F (36.6 C)     TempSrc: Oral     Resp: 15     Weight:  115.395 kg (254 lb 6.4 oz)    SpO2: 95%   91%    Weight change: -0.544 kg (-1 lb 3.2 oz) Filed Weights   08/12/15 0143 08/13/15 0400 08/14/15 0616  Weight: 114.76 kg (253 lb) 115.939 kg (255 lb 9.6 oz) 115.395 kg (254 lb 6.4 oz)   Body mass index is 38.69 kg/(m^2).   Gen Exam: Pleasantly confused-speech clear Neck: Supple,  No JVD.   Chest: Few bibasilar rales CVS: S1 S2 irregular, no murmurs.  Abdomen: soft, BS +, non tender, non distended.  Extremities: Unna boots in place Neurologic: Non Focal-but with generalized weakness  Skin: No Rash.  Wounds: N/A.   Intake/Output from previous day:  Intake/Output Summary (Last 24 hours) at 08/14/15 1341 Last data filed at 08/14/15 1300  Gross per 24 hour  Intake    600 ml  Output    501 ml  Net     99 ml     LAB RESULTS: CBC  Recent Labs Lab 08/11/15 1616 08/12/15 0308 08/13/15 0745  WBC 6.1 9.4 5.0  HGB 10.9* 11.2* 10.1*  HCT 37.1* 37.5* 33.1*  PLT 93* 106* 92*  MCV 101.9* 99.7 100.6*  MCH 29.9 29.8 30.7  MCHC 29.4* 29.9* 30.5  RDW 16.1* 16.1* 16.2*    Chemistries   Recent Labs Lab 08/11/15 1616 08/12/15 0308 08/13/15 0745 08/14/15 0456  NA 136  --  135 135  K 4.8  --  3.9 4.6  CL 98*  --  95* 95*  CO2 27  --  34* 30  GLUCOSE 221*  --  155* 176*  BUN 41*  --  39* 47*  CREATININE 1.56* 1.36* 1.67* 2.09*  CALCIUM 9.0  --  9.0 8.9    CBG:  Recent Labs Lab 08/13/15 1134 08/13/15 1712  08/13/15 2155 08/14/15 0739 08/14/15 1142  GLUCAP 186* 194* 202* 176* 204*    GFR Estimated Creatinine Clearance: 40 mL/min (by C-G formula based on Cr of 2.09).  Coagulation profile  Recent Labs Lab 08/12/15 1555  INR 1.43    Cardiac Enzymes  Recent Labs Lab 08/12/15 0308 08/12/15 0726 08/12/15 1255  TROPONINI 0.09* 0.08* 0.07*    Invalid input(s): POCBNP No results for input(s): DDIMER in the last 72 hours.  Recent Labs  08/13/15 0745  HGBA1C 6.8*    Recent Labs  08/14/15 0456  CHOL 108  HDL 26*  LDLCALC 65  TRIG 87  CHOLHDL 4.2    Recent Labs  08/12/15 0308  TSH 0.976   No results for input(s): VITAMINB12, FOLATE, FERRITIN, TIBC, IRON, RETICCTPCT in the last 72 hours. No results for input(s): LIPASE, AMYLASE in the last 72 hours.  Urine Studies No results for input(s): UHGB, CRYS in the last 72  hours.  Invalid input(s): UACOL, UAPR, USPG, UPH, UTP, UGL, UKET, UBIL, UNIT, UROB, ULEU, UEPI, UWBC, URBC, UBAC, CAST, UCOM, BILUA  MICROBIOLOGY: Recent Results (from the past 240 hour(s))  MRSA PCR Screening     Status: None   Collection Time: 08/12/15  2:19 AM  Result Value Ref Range Status   MRSA by PCR NEGATIVE NEGATIVE Final    Comment:        The GeneXpert MRSA Assay (FDA approved for NASAL specimens only), is one component of a comprehensive MRSA colonization surveillance program. It is not intended to diagnose MRSA infection nor to guide or monitor treatment for MRSA infections.   Culture, blood (routine x 2) Call MD if unable to obtain prior to antibiotics being given     Status: None (Preliminary result)   Collection Time: 08/12/15  3:00 AM  Result Value Ref Range Status   Specimen Description BLOOD LEFT HAND  Final   Special Requests BOTTLES DRAWN AEROBIC AND ANAEROBIC 5CC  Final   Culture NO GROWTH 1 DAY  Final   Report Status PENDING  Incomplete  Culture, blood (routine x 2) Call MD if unable to obtain prior to antibiotics being given     Status: None (Preliminary result)   Collection Time: 08/12/15  3:08 AM  Result Value Ref Range Status   Specimen Description BLOOD RIGHT HAND  Final   Special Requests IN PEDIATRIC BOTTLE 3CC  Final   Culture NO GROWTH 1 DAY  Final   Report Status PENDING  Incomplete    RADIOLOGY STUDIES/RESULTS: Dg Chest 2 View  08/11/2015  CLINICAL DATA:  Shortness of breath. Altered mental status. Choked while eating this morning. Possible aspiration. EXAM: CHEST  2 VIEW COMPARISON:  07/04/2015. FINDINGS: Stable enlarged cardiac silhouette and post CABG changes. Interval mild patchy and linear density at the right lung base. Stable prominence of the interstitial markings. Mild increase in prominence of the pulmonary vasculature. Breathing motion blurring on the lateral view. IMPRESSION: 1. Atelectasis at the right lung base. Superimposed  aspiration is unlikely based on the current appearance. 2. Stable cardiomegaly and mild chronic interstitial lung disease with interval mild pulmonary vascular congestion. Electronically Signed   By: Claudie Revering M.D.   On: 08/11/2015 17:07   Ct Head Wo Contrast  08/11/2015  CLINICAL DATA:  Initial encounter for 69 YOM walking using his walker and fell backwards and hit his head today 08/11/15. EXAM: CT HEAD WITHOUT CONTRAST CT CERVICAL SPINE WITHOUT CONTRAST TECHNIQUE: Multidetector CT imaging of the head and cervical spine was  performed following the standard protocol without intravenous contrast. Multiplanar CT image reconstructions of the cervical spine were also generated. COMPARISON:  Head CT of 05/26/2015. No prior cervical spine imaging. FINDINGS: CT HEAD FINDINGS Sinuses/Soft tissues: Mild left parietal scalp soft tissue thickening on image/series 67/4. Right maxillary sinus mucous retention cyst or polyp. No skull fracture. Clear mastoid air cells. Intracranial: Cerebral atrophy. Moderate low density in the periventricular white matter likely related to small vessel disease. Left cerebellar remote infarct. No mass lesion, hemorrhage, hydrocephalus, acute infarct, intra-axial, or extra-axial fluid collection. CT CERVICAL SPINE FINDINGS Spinal visualization through the bottom of T4. Prevertebral soft tissues are within normal limits. Dense carotid atherosclerosis bilaterally. No apical pneumothorax. Advanced spondylosis. Left worse than right neural foraminal narrowing at multiple levels. Mild central canal stenosis, most significant at C3-4. Prior median sternotomy. Maintenance of vertebral body height. Trace C3-4 retrolisthesis. From C7 inferiorly are mildly degraded secondary to patient body habitus. Facets are well-aligned. Coronal reformats demonstrate a normal C1-C2 articulation. IMPRESSION: 1. Mild left parietal scalp soft tissue swelling. No acute intracranial abnormality. 2. Advanced cervical  spondylosis. Trace C3-4 is favored to be degenerative. Otherwise, no acute findings in the cervical spine identified. 3.  Cerebral atrophy and small vessel ischemic change. Electronically Signed   By: Abigail Miyamoto M.D.   On: 08/11/2015 16:50   Ct Angio Chest Pe W/cm &/or Wo Cm  08/11/2015  CLINICAL DATA:  Worsening dyspnea.  Altered mental status. EXAM: CT ANGIOGRAPHY CHEST WITH CONTRAST TECHNIQUE: Multidetector CT imaging of the chest was performed using the standard protocol during bolus administration of intravenous contrast. Multiplanar CT image reconstructions and MIPs were obtained to evaluate the vascular anatomy. CONTRAST:  22mL OMNIPAQUE IOHEXOL 350 MG/ML SOLN COMPARISON:  08/28/2014 FINDINGS: Cardiovascular: There is good opacification of the pulmonary arteries. There is no pulmonary embolism. The thoracic aorta is normal in caliber and intact. There is extensive calcified coronary artery plaque. There are multiple coronary artery bypass grafts. Lungs: There are multifocal confluent airspace opacities, central predominant. Considerations include alveolar edema, infectious infiltrate. Hemorrhage or neoplasm less likely but not entirely excluded. Central airways: Patent Effusions: None Lymphadenopathy: Mildly prominent hilar and mediastinal nodes, more likely reactive Esophagus: Unremarkable Upper abdomen: Unremarkable Musculoskeletal: No significant abnormality except for moderate thoracic degenerative disc disease, kyphosis, and prior sternotomy. Review of the MIP images confirms the above findings. IMPRESSION: 1. Negative for acute pulmonary embolism 2. Confluent airspace opacities bilaterally, right worse than left. Infectious infiltrate is a leading consideration. Alveolar edema less likely but not excluded. There also are mildly prominent hilar and mediastinal nodes which may be reactive. Electronically Signed   By: Andreas Newport M.D.   On: 08/11/2015 23:21   Ct Cervical Spine Wo  Contrast  08/11/2015  CLINICAL DATA:  Initial encounter for 69 YOM walking using his walker and fell backwards and hit his head today 08/11/15. EXAM: CT HEAD WITHOUT CONTRAST CT CERVICAL SPINE WITHOUT CONTRAST TECHNIQUE: Multidetector CT imaging of the head and cervical spine was performed following the standard protocol without intravenous contrast. Multiplanar CT image reconstructions of the cervical spine were also generated. COMPARISON:  Head CT of 05/26/2015. No prior cervical spine imaging. FINDINGS: CT HEAD FINDINGS Sinuses/Soft tissues: Mild left parietal scalp soft tissue thickening on image/series 67/4. Right maxillary sinus mucous retention cyst or polyp. No skull fracture. Clear mastoid air cells. Intracranial: Cerebral atrophy. Moderate low density in the periventricular white matter likely related to small vessel disease. Left cerebellar remote infarct. No mass lesion, hemorrhage,  hydrocephalus, acute infarct, intra-axial, or extra-axial fluid collection. CT CERVICAL SPINE FINDINGS Spinal visualization through the bottom of T4. Prevertebral soft tissues are within normal limits. Dense carotid atherosclerosis bilaterally. No apical pneumothorax. Advanced spondylosis. Left worse than right neural foraminal narrowing at multiple levels. Mild central canal stenosis, most significant at C3-4. Prior median sternotomy. Maintenance of vertebral body height. Trace C3-4 retrolisthesis. From C7 inferiorly are mildly degraded secondary to patient body habitus. Facets are well-aligned. Coronal reformats demonstrate a normal C1-C2 articulation. IMPRESSION: 1. Mild left parietal scalp soft tissue swelling. No acute intracranial abnormality. 2. Advanced cervical spondylosis. Trace C3-4 is favored to be degenerative. Otherwise, no acute findings in the cervical spine identified. 3.  Cerebral atrophy and small vessel ischemic change. Electronically Signed   By: Abigail Miyamoto M.D.   On: 08/11/2015 16:50   Mr Brain Wo  Contrast  08/12/2015  CLINICAL DATA:  Initial evaluation for syncope. EXAM: MRI HEAD WITHOUT CONTRAST TECHNIQUE: Multiplanar, multiecho pulse sequences of the brain and surrounding structures were obtained without intravenous contrast. COMPARISON:  Prior CT from on 08/11/2015. FINDINGS: Diffuse prominence of the CSF containing spaces is compatible with generalized age-related cerebral atrophy. Patchy T2/FLAIR hyperintensity within the periventricular and deep white matter both cerebral hemispheres most likely related chronic small vessel ischemic disease. Encephalomalacia within the left cerebellar hemisphere consistent with remote infarct. Associated chronic hemosiderin staining present within this region. Probable remote infarct within the anteromedial left frontal lobe as well. There is a possible tiny punctate 4 mm focus of high signal intensity on DWI sequence involving the cortical gray matter of the posterior right frontal region (series 3, image 31 on axial sequence, series 5, image 14 on coronal sequence). Finding may reflect a tiny acute ischemic infarct. Corresponding signal loss not definitely seen on ADC map, although this is felt to be too small to be resolved. No other definite acute intracranial infarct. Abnormal flow void within the distal left vertebral artery, which may related to focal plaque or possibly chronic dissection (series 8, image 3), similar to prior. Major intracranial vascular flow voids otherwise maintained. The right vertebral artery is diminutive. No acute intracranial hemorrhage. No mass lesion, midline shift, or mass effect. No hydrocephalus. No extra-axial fluid collection. Craniocervical junction grossly within normal limits. Degenerative spondylolysis noted within the partially visualized upper cervical spine. Pituitary gland grossly normal. No acute abnormality about the orbits. Sequela prior lens extraction present on the left. Mild mucosal thickening within the maxillary  sinuses and ethmoidal air cells. Paranasal sinuses are otherwise clear. Minimal opacity within the right mastoid air cells. Inner ear structures normal. Bone marrow signal intensity within normal limits. No scalp soft tissue abnormality. IMPRESSION: 1. Question 4 mm punctate focus of high signal intensity on DWI sequence within the posterior right frontal lobe. Finding favored to reflect artifact, although possible tiny acute ischemic infarct is not entirely excluded, and could be considered in the correct clinical setting. 2. No other acute intracranial process. 3. 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 4. Abnormal flow void within the distal left vertebral artery, which may be related to focal plaque or possibly chronic dissection. This is similar relative to prior study. 5. Age-related cerebral atrophy with mild to moderate chronic small vessel ischemic disease. Electronically Signed   By: Jeannine Boga M.D.   On: 08/12/2015 02:40   Dg Abd Acute W/chest  08/12/2015  CLINICAL DATA:  Episode of nausea and vomiting earlier, no abdominal pain, history of bowel resection  and breast malignancy. EXAM: DG ABDOMEN ACUTE W/ 1V CHEST COMPARISON:  Portable chest x-ray of August 11, 2015 and chest CT scan of the same day FINDINGS: The lungs are borderline hypoinflated. Confluent alveolar opacities are present at both lung bases greater on the right than on the left. A trace of pleural fluid on the right is suspected. The cardiac silhouette is mildly enlarged. The central pulmonary vascularity is engorged and indistinct. The patient has undergone previous CABG. The stool burden within the colon is mildly increased. There is a moderate amount of gas within loops of the sigmoid colon. There is no small or large bowel obstructive pattern and no evidence of perforation. There is degenerative change of the lower lumbar spine. There is contrast within the urinary bladder  from the earlier CT scan. There is splenic arterial calcification. IMPRESSION: 1. Patchy airspace opacities bilaterally consistent with pneumonia. A small amount of pleural fluid is likely present on the right. 2. CHF with mild pulmonary vascular congestion. 3. No acute intra-abdominal abnormality is observed. Increased colonic stool burden may reflect constipation in the appropriate clinical setting. Electronically Signed   By: David  Martinique M.D.   On: 08/12/2015 08:16    Oren Binet, MD  [facility] Pager:[phone]  If 7PM-7AM, please contact night-coverage [email or web address] Password [email or web address] 08/14/2015, 1:41 PM   LOS: 3 days

## 2015-08-14 NOTE — Care Management Important Message (Signed)
Important Message  Patient Details  Name: [name] MRN: [id number] Date of Birth: 02-Mar-1946   Medicare Important Message Given:  Yes-second notification given    Nathen May 08/14/2015, 3:44 PM

## 2015-08-14 NOTE — Progress Notes (Signed)
STROKE TEAM PROGRESS NOTE   HISTORY [name] is a 70 y.o. male w/ PMHx of CHF, CKD, previous h/o CVA, Gout, EtOH abuse, and OSA, presents to the ED after having multiple falls at home. While in the ED, patient found to be hypoxic, CTA chest significant for pneumonia. Also found to be confused in the ED, MRI brain performed, showed punctate infarct in the right frontal lobe. Patient w/ known h/o PAF not on anticoagulation 2/2 prior EtOH abuse.   SUBJECTIVE (INTERVAL HISTORY) Some continued confusion overnight. Exam generally unchanged this AM.    OBJECTIVE Temp:  [98.2 F (36.8 C)-98.5 F (36.9 C)] 98.5 F (36.9 C) (11/03 0034) Pulse Rate:  [65-94] 91 (11/03 0400) Cardiac Rhythm:  [-] Atrial fibrillation (11/03 0400) Resp:  [15-24] 15 (11/03 0400) BP: (115-159)/(60-85) 147/79 mmHg (11/03 0400) SpO2:  [95 %-99 %] 95 % (11/03 0400) Weight:  [254 lb 6.4 oz (115.395 kg)] 254 lb 6.4 oz (115.395 kg) (11/03 0616)  CBC:   Recent Labs Lab 08/12/15 0308 08/13/15 0745  WBC 9.4 5.0  HGB 11.2* 10.1*  HCT 37.5* 33.1*  MCV 99.7 100.6*  PLT 106* 92*    Basic Metabolic Panel:   Recent Labs Lab 08/13/15 0745 08/14/15 0456  NA 135 135  K 3.9 4.6  CL 95* 95*  CO2 34* 30  GLUCOSE 155* 176*  BUN 39* 47*  CREATININE 1.67* 2.09*  CALCIUM 9.0 8.9    Lipid Panel:     Component Value Date/Time   CHOL 108 08/14/2015 0456   TRIG 87 08/14/2015 0456   HDL 26* 08/14/2015 0456   CHOLHDL 4.2 08/14/2015 0456   VLDL 17 08/14/2015 0456   LDLCALC 65 08/14/2015 0456   HgbA1c:  Lab Results  Component Value Date   HGBA1C 6.8* 08/13/2015   Urine Drug Screen:     Component Value Date/Time   LABOPIA POSITIVE* 07/21/2014 2152   COCAINSCRNUR NONE DETECTED 07/21/2014 2152   LABBENZ NONE DETECTED 07/21/2014 2152   AMPHETMU NONE DETECTED 07/21/2014 2152   THCU NONE DETECTED 07/21/2014 2152   LABBARB NONE DETECTED 07/21/2014 2152      IMAGING  Dg Abd Acute W/chest  08/12/2015   CLINICAL DATA:  Episode of nausea and vomiting earlier, no abdominal pain, history of bowel resection and breast malignancy. EXAM: DG ABDOMEN ACUTE W/ 1V CHEST COMPARISON:  Portable chest x-ray of August 11, 2015 and chest CT scan of the same day FINDINGS: The lungs are borderline hypoinflated. Confluent alveolar opacities are present at both lung bases greater on the right than on the left. A trace of pleural fluid on the right is suspected. The cardiac silhouette is mildly enlarged. The central pulmonary vascularity is engorged and indistinct. The patient has undergone previous CABG. The stool burden within the colon is mildly increased. There is a moderate amount of gas within loops of the sigmoid colon. There is no small or large bowel obstructive pattern and no evidence of perforation. There is degenerative change of the lower lumbar spine. There is contrast within the urinary bladder from the earlier CT scan. There is splenic arterial calcification. IMPRESSION: 1. Patchy airspace opacities bilaterally consistent with pneumonia. A small amount of pleural fluid is likely present on the right. 2. CHF with mild pulmonary vascular congestion. 3. No acute intra-abdominal abnormality is observed. Increased colonic stool burden may reflect constipation in the appropriate clinical setting. Electronically Signed   By: David  Martinique M.D.   On: 08/12/2015 08:16  PHYSICAL EXAM  General: Overweight elderly male, alert, cooperative, NAD. Multiple superficial ecchymoses.  HEENT: PERRL, EOMI. Moist mucus membranes Neck: Full range of motion without pain, supple, no lymphadenopathy or carotid bruits Lungs: Clear to ascultation bilaterally, normal work of respiration, no wheezes, rales, rhonchi Heart: Irregularly irregular, no murmurs, gallops, or rubs Abdomen: Soft, non-tender, non-distended, BS + Extremities: No cyanosis, clubbing, or edema  Mental Status -  Level of arousal and orientation to time, place, and  person were intact. Language including expression, naming, repetition, comprehension was assessed and found intact. Attention span and concentration were normal. Recent and remote memory were intact. Fund of Knowledge was assessed and was intact.  Cranial Nerves II - XII - II - Visual field intact OU. III, IV, VI - Extraocular movements intact. V - Facial sensation intact bilaterally. VII - Facial movement intact bilaterally. VIII - Hearing & vestibular intact bilaterally. X - Palate elevates symmetrically. XI - Chin turning & shoulder shrug intact bilaterally. XII - Tongue protrusion intac.  Motor Strength - The patient's strength was normal in all extremities and pronator drift was absent.  Bulk was normal and fasciculations were absent.   Motor Tone - Muscle tone was assessed at the neck and appendages and was normal.  Reflexes - The patient's reflexes were 1+ in all extremities and he had no pathological reflexes.  Sensory - Light touch, temperature/pinprick, vibration and proprioception, and Romberg testing were assessed and were symmetrical.    Coordination - The patient had normal movements in the hands and feet with no ataxia or dysmetria.  Mild right-sided tremor present.    ASSESSMENT/PLAN [name] is a 69 y.o. male w/ PMHx of CHF, CKD, previous h/o CVA, Gout, EtOH abuse, and OSA, admitted for suspected pneumonia and respiratory distress, also found to have TIA.   TIA: Incidental finding of non-dominant right posterior frontal lobe infarct embolic secondary to small vessel disease vs cardioembolic source  MRI:  Question 4 mm punctate focus of high signal intensity on DWI sequence within the posterior right frontal lobe.  Carotid Doppler  Bilateral 40-59% ICA stenosis. Right vertebral antegrade. Left vertebral retrograde, suggesting a proximal stenosis   LDL 65  HgbA1c 8.0 (06/10/15)  EEG: Mild generalized nonspecific continuous slowing of cerebral activity    Lovenox for VTE prophylaxis Diet heart healthy/carb modified Room service appropriate?: Yes; Fluid consistency:: Thin  aspirin 81 mg daily prior to admission, now on aspirin 81 mg daily.   Patient counseled to be compliant with his antithrombotic medications  Ongoing aggressive stroke risk factor management  Therapy recommendations:  SNF  Disposition:  Pending, likely SNF per primary team  Acute Encephalopathy  Seems to have resolved. Suspect this is more 2/2 infection rather than incidental stroke finding.  EGG w/ nonspecific slowing.    Add thiamine 100 mg daily given concern for underlying Wernicke's Encephalopathy  Carotid dopplers pending as above  PAF  Patient w/ CHA2DS2-VASc Score and unadjusted Ischemic Stroke Rate (% per year) is equal to  9.7 % stroke rate/year from a score of 6, however, given his h/o multiple falls at home, EtOH  abuse, and thrombocytopenia, the patient is not a good candidate for further anticoagulation at this  time.   Could reconsider starting anticoagulation in the future if patient continues to abstain from  alcohol.   ?HCAP  Management per Primary team, on Vancomycin + Zosyn  Hypertension  Stable  Continue home medications: Coreg, Lasix  Hyperlipidemia  Home meds:  Lipitor resumed  in hospital  LDL 65, goal < 70  Continue statin at discharge  Diabetes  HgbA1c 8.0, goal < 7.0  Controlled  Other Stroke Risk Factors  Advanced age  ETOH use  Obesity, Body mass index is 38.69 kg/(m^2).   Hx stroke/TIA  Obstructive sleep apnea, on CPAP at home   Hospital day # 3  Patient still w/ confusion overnight. Exam generally unchanged today. Suspect acute encephalopathy is more related to infection than stroke as discussed previously. EEG showed nonspecific slowing, therefore added Thiamine 100 mg daily given h/o alcohol abuse. Carotid dopplers pending. Patient not a good candidate for anticoagulation in the setting of PAF as  discussed above. Could consider in the future if patient continues to be abstinent from alcohol. Stroke service will sign off at this time. Please feel free to call back if needed.   Natasha Bence, MD PGY-3, Internal Medicine Pager: [phone] I have personally examined this patient, reviewed notes, independently viewed imaging studies, participated in medical decision making and plan of care. I have made any additions or clarifications directly to the above note. Agree with note above.  Stroke team will sign off. Kindly call for questions  Antony Contras, MD Medical Director [place] Pager: [phone] 08/14/2015 4:08 PM     To contact Stroke Continuity provider, please refer to [email or web address] After hours, contact General Neurology

## 2015-08-15 ENCOUNTER — Encounter (HOSPITAL_COMMUNITY)
Admission: EM | Disposition: A | Discharge status: Skilled Nursing Facility | Payer: Self-pay | Source: Home / Self Care | Attending: Internal Medicine

## 2015-08-15 ENCOUNTER — Inpatient Hospital Stay (HOSPITAL_COMMUNITY): Payer: Medicare Other

## 2015-08-15 DIAGNOSIS — I270 Primary pulmonary hypertension: Secondary | ICD-10-CM

## 2015-08-15 DIAGNOSIS — N170 Acute kidney failure with tubular necrosis: Secondary | ICD-10-CM

## 2015-08-15 DIAGNOSIS — J690 Pneumonitis due to inhalation of food and vomit: Secondary | ICD-10-CM

## 2015-08-15 DIAGNOSIS — I272 Other secondary pulmonary hypertension: Secondary | ICD-10-CM

## 2015-08-15 DIAGNOSIS — R0902 Hypoxemia: Secondary | ICD-10-CM | POA: Insufficient documentation

## 2015-08-15 HISTORY — PX: CARDIAC CATHETERIZATION: SHX172

## 2015-08-15 LAB — BASIC METABOLIC PANEL
ANION GAP: 10 (ref 5–15)
BUN: 49 mg/dL — ABNORMAL HIGH (ref 6–20)
CALCIUM: 9.1 mg/dL (ref 8.9–10.3)
CO2: 32 mmol/L (ref 22–32)
Chloride: 95 mmol/L — ABNORMAL LOW (ref 101–111)
Creatinine, Ser: 2.31 mg/dL — ABNORMAL HIGH (ref 0.61–1.24)
GFR, EST AFRICAN AMERICAN: 31 mL/min — AB (ref >60)
GFR, EST NON AFRICAN AMERICAN: 27 mL/min — AB (ref >60)
Glucose, Bld: 127 mg/dL — ABNORMAL HIGH (ref 65–99)
POTASSIUM: 4.7 mmol/L (ref 3.5–5.1)
SODIUM: 137 mmol/L (ref 135–145)

## 2015-08-15 LAB — GLUCOSE, CAPILLARY
GLUCOSE-CAPILLARY: 120 mg/dL — AB (ref 65–99)
GLUCOSE-CAPILLARY: 190 mg/dL — AB (ref 65–99)
GLUCOSE-CAPILLARY: 162 mg/dL — AB (ref 65–99)
Glucose-Capillary: 174 mg/dL — ABNORMAL HIGH (ref 65–99)

## 2015-08-15 LAB — POCT I-STAT 3, VENOUS BLOOD GAS (G3P V)
Acid-Base Excess: 7 mmol/L — ABNORMAL HIGH (ref 0.0–2.0)
BICARBONATE: 34.8 meq/L — AB (ref 20.0–24.0)
O2 Saturation: 39 %
PCO2 VEN: 63.4 mmHg — AB (ref 45.0–50.0)
PH VEN: 7.347 — AB (ref 7.250–7.300)
PO2 VEN: 25 mmHg — AB (ref 30.0–45.0)
TCO2: 37 mmol/L (ref 0–100)

## 2015-08-15 LAB — CBC
HCT: 35.4 % — ABNORMAL LOW (ref 39.0–52.0)
Hemoglobin: 10.8 g/dL — ABNORMAL LOW (ref 13.0–17.0)
MCH: 30.4 pg (ref 26.0–34.0)
MCHC: 30.5 g/dL (ref 30.0–36.0)
MCV: 99.7 fL (ref 78.0–100.0)
PLATELETS: 101 10*3/uL — AB (ref 150–400)
RBC: 3.55 MIL/uL — AB (ref 4.22–5.81)
RDW: 15.7 % — AB (ref 11.5–15.5)
WBC: 5.5 10*3/uL (ref 4.0–10.5)

## 2015-08-15 SURGERY — RIGHT HEART CATH
Anesthesia: LOCAL

## 2015-08-15 MED ORDER — SODIUM CHLORIDE 0.9 % IJ SOLN: 3.0000 mL | INTRAMUSCULAR | Status: DC | PRN

## 2015-08-15 MED ORDER — SODIUM CHLORIDE 0.9 % IV SOLN: 250.0000 mL | INTRAVENOUS | Status: DC | PRN

## 2015-08-15 MED ORDER — LIDOCAINE HCL (PF) 1 % IJ SOLN
INTRAMUSCULAR | Status: DC | PRN
  Administered 2015-08-15: 16:00:00

## 2015-08-15 MED ORDER — FUROSEMIDE 10 MG/ML IJ SOLN
80.0000 mg | Freq: Once | INTRAMUSCULAR | Status: AC
  Administered 2015-08-15: 80 mg via INTRAVENOUS
  Filled 2015-08-15: qty 8

## 2015-08-15 MED ORDER — SODIUM CHLORIDE 0.9 % IV SOLN: INTRAVENOUS | Status: DC

## 2015-08-15 MED ORDER — ACETAMINOPHEN 325 MG PO TABS
650.0000 mg | ORAL_TABLET | ORAL | Status: DC | PRN
  Administered 2015-08-17: 650 mg via ORAL
  Filled 2015-08-15: qty 2

## 2015-08-15 MED ORDER — SODIUM CHLORIDE 0.9 % IJ SOLN
3.0000 mL | Freq: Two times a day (BID) | INTRAMUSCULAR | Status: DC
  Administered 2015-08-16 – 2015-08-22 (×6): 3 mL via INTRAVENOUS

## 2015-08-15 MED ORDER — LIDOCAINE HCL (PF) 1 % IJ SOLN
INTRAMUSCULAR | Status: AC
  Filled 2015-08-15: qty 30

## 2015-08-15 MED ORDER — ACETAZOLAMIDE 250 MG PO TABS
500.0000 mg | ORAL_TABLET | Freq: Two times a day (BID) | ORAL | Status: DC
  Administered 2015-08-15 – 2015-08-22 (×14): 500 mg via ORAL
  Filled 2015-08-15 (×15): qty 2

## 2015-08-15 MED ORDER — HEPARIN (PORCINE) IN NACL 2-0.9 UNIT/ML-% IJ SOLN
INTRAMUSCULAR | Status: AC
Start: 2015-08-15 — End: 2015-08-15
  Filled 2015-08-15: qty 500

## 2015-08-15 MED ORDER — SODIUM CHLORIDE 0.9 % IJ SOLN: 3.0000 mL | Freq: Two times a day (BID) | INTRAMUSCULAR | Status: DC

## 2015-08-15 MED ORDER — ONDANSETRON HCL 4 MG/2ML IJ SOLN: 4.0000 mg | Freq: Four times a day (QID) | INTRAMUSCULAR | Status: DC | PRN

## 2015-08-15 MED ORDER — SODIUM CHLORIDE 0.9 % IJ SOLN
3.0000 mL | Freq: Two times a day (BID) | INTRAMUSCULAR | Status: DC
  Administered 2015-08-16 – 2015-08-21 (×6): 3 mL via INTRAVENOUS

## 2015-08-15 SURGICAL SUPPLY — 11 items
CATH SWAN VIP NON-HEP 7.5F (CATHETERS) ×2 IMPLANT
HOVERMATT SINGLE USE (MISCELLANEOUS) ×2 IMPLANT
KIT HEART RIGHT NAMIC (KITS) ×2 IMPLANT
PACK CARDIAC CATHETERIZATION (CUSTOM PROCEDURE TRAY) ×2 IMPLANT
PROTECTION STATION PRESSURIZED (MISCELLANEOUS) ×2
SHEATH PINNACLE 7F 10CM (SHEATH) IMPLANT
SHEATH PINNACLE 8F 10CM (SHEATH) ×2 IMPLANT
SLEEVE REPOSITIONING LENGTH 30 (MISCELLANEOUS) ×2 IMPLANT
STATION PROTECTION PRESSURIZED (MISCELLANEOUS) ×1 IMPLANT
TRANSDUCER W/STOPCOCK (MISCELLANEOUS) ×2 IMPLANT
WIRE EMERALD 3MM-J .025X260CM (WIRE) ×2 IMPLANT

## 2015-08-15 NOTE — Progress Notes (Addendum)
PATIENT DETAILS Name: [name] Age: 69 y.o. Sex: male Date of Birth: [birth year] Admit Date: 08/11/2015 Admitting Physician Rise Patience, MD JHE:RDEYCX,KGYJE, MD  Brief narrative:  69 year old Caucasian male with history of chronic diastolic heart failure, prior history of alcohol abuse, chronic kidney disease stage III, diabetes, CAD status post CABG-admitted for evaluation of syncopal episode after vomiting. Found to have suspected aspiration pneumonia, acute diastolic heart failure causing acute hypoxic respiratory failure. Hospital course complicated by development of worsening renal failure. See below for further details   Subjective: Complains of shortness of breath-unable to lie flat. Less confused today.  Assessment/Plan: Principal Problem: Acute respiratory failure with hypoxia: Suspect multifactorial-from probable aspiration pneumonia, and Acute diastolic heart failure. Continue empiric antibiotics,  Lasix 1.  Titrate off oxygen as tolerated-follow  Active Problems: ?Syncope/Pre syncope: In a setting of vomiting-? Vagal. Telemetry-shows A. fib-but no other arrhythmias. Recent echocardiogram on 06/12/15 shows EF around 60-65%. EEG negative for seizure-like activity. Continue to monitor in telemetry.  cardiology following   Probable aspiration pneumonia: Continue empiric Zosyn, will discontinue vancomycin.SLP evaluation pending. Blood cultures negative so far, urine streptococcal  and  Legionella antigen negative  Acute diastolic heart failure: Dry weight appears to be around 244 pounds-current weight 254 pounds-negative balance of 2.2 L-given significant increase in creatinine-held Lasix on 11/3-but given worsening shortness of breath-Will give one dose of 80 mg Lasix today. Spoke with cardiology-planning on right heart catheterization today. Cardiology following  Acute on CKD stage 3: creatinine slightly elevated than his usual baseline. CKD likely  secondary to underlying diabetic nephropathy-ARF likely secondary to acute illness/prerenal azotemia/diuretics/possible contrast-induced nephropathy.  since much more short of breath today, one dose of Lasix given, renal ultrasound negative for hydronephrosis. Continue to monitor lytes closely. Strict intake/output Hold Lasix and Follow electrolytes  Acute encephalopathy:Remains mildly confused- suspect intermittent sundowning/delirium in the setting of undiagnosed mild dementia/cognitive dysfunction.Per patient and family last use of EtOH was at least 2-4 months back-therefore do not think has alcohol withdrawal.  CVA: Suspect more of an incidental finding. Has a history of atrial fibrillation-considered a poor candidate for long-term anticoagulation given history of alcohol use and thrombocytopenia.CHA2DS2-VASc score 6.A1c 6.8, LDL 65. Carotid Doppler pending.Continue with aspirin and statin.  Anemia/thrombocytopenia: Chronic issue, stable for outpatient work up  Minimally elevated troponins: May be demand ischemia or false positive elevation in the setting of chronic kidney disease. No workup required, continue with aspirin, statin and beta blockers  Atrial fibrillation: See above regarding anticoagulation-rate controlled with Coreg.  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 Chronic small ulceration in the anterior abdominal wall that appears stable  History of right breast cancer: Status post mastectomy, continue tamoxifen.Follow with Dr Lindi Adie  Essential hypertension:controlled, continue Coreg, hydralazine and Imdur. Follow BP   Type 2 diabetes: CBGs stable-with SSI.  A1c 6.8. Previously was on oral agents that was discontinued in the past because of hypoglycemia-will need to be reassessed on discharge   Hypothyroidism: Continue with Synthroid  COPD: Continue with bronchodilators-this  appears stable  Gout: Continue colchicine-appears stable  CAD status post CABG: Continue aspirin, statins and beta blockers  OSA: Related to obesity-continue CPAP  Deconditioning: Suspect underlying failure to thrive syndrome-Will likely require SNF on discharge   Disposition: Remain inpatient-require several more days of hospitalization prior to discharge  Antimicrobial agents  See below  Anti-infectives    Start     Dose/Rate Route Frequency Ordered Stop   08/12/15 1000  vancomycin (VANCOCIN) IVPB 750 mg/150 ml premix  Status:  Discontinued     750 mg 150 mL/hr over 60 Minutes Intravenous Every 12 hours 08/12/15 0215 08/14/15 0906   08/12/15 0230  vancomycin (VANCOCIN) 1,250 mg in sodium chloride 0.9 % 250 mL IVPB     1,250 mg 166.7 mL/hr over 90 Minutes Intravenous  Once 08/12/15 0215 08/12/15 0542   08/12/15 0230  piperacillin-tazobactam (ZOSYN) IVPB 3.375 g     3.375 g 12.5 mL/hr over 240 Minutes Intravenous 3 times per day 08/12/15 0215        DVT Prophylaxis: Prophylactic Lovenox   Code Status: Full code   Family Communication Daughter over the phone  Procedures: None  CONSULTS:  cardiology and neurology  Time spent 30 minutes-Greater than 50% of this time was spent in counseling, explanation of diagnosis, planning of further management, and coordination of care.  MEDICATIONS: Scheduled Meds: . allopurinol  300 mg Oral Daily  . aspirin EC  325 mg Oral Daily  . atorvastatin  20 mg Oral Daily  . budesonide (PULMICORT) nebulizer solution  0.25 mg Nebulization BID  . carvedilol  6.25 mg Oral BID WC  . colchicine  0.6 mg Oral Daily  . enoxaparin (LOVENOX) injection  40 mg Subcutaneous Q24H  . FLUoxetine  20 mg Oral Daily  . folic acid  1 mg Oral Daily  . gabapentin  300 mg Oral BID  . hydrALAZINE  25 mg Oral 3 times per day  . insulin aspart  0-9 Units Subcutaneous TID WC  . ipratropium-albuterol  3 mL Nebulization TID  . isosorbide dinitrate  10 mg  Oral TID  . levothyroxine  187 mcg Oral QAC breakfast  . magnesium oxide  400 mg Oral BID  . pantoprazole  40 mg Oral Daily  . piperacillin-tazobactam (ZOSYN)  IV  3.375 g Intravenous 3 times per day  . potassium chloride  20 mEq Oral QHS  . potassium chloride SA  40 mEq Oral Daily  . tamoxifen  20 mg Oral QHS  . thiamine  100 mg Oral Daily  . venlafaxine XR  37.5 mg Oral Daily   Continuous Infusions:  PRN Meds:.albuterol, oxyCODONE, senna-docusate    PHYSICAL EXAM: Vital signs in last 24 hours: Filed Vitals:   08/14/15 2053 08/15/15 0500 08/15/15 0903 08/15/15 0935  BP: 122/90 145/87  142/77  Pulse: 82 84  92  Temp: 97.5 F (36.4 C) 97.9 F (36.6 C)    TempSrc: Oral Oral    Resp: 23 18    Weight:  115.304 kg (254 lb 3.2 oz)    SpO2: 92% 95% 93%     Weight change: -0.091 kg (-3.2 oz) Filed Weights   08/13/15 0400 08/14/15 0616 08/15/15 0500  Weight: 115.939 kg (255 lb 9.6 oz) 115.395 kg (254 lb 6.4 oz) 115.304 kg (254 lb 3.2 oz)   Body mass index is 38.66 kg/(m^2).   Gen Exam: much more awake and alert -speech clear Neck: Supple, No JVD.   Chest: Few bibasilar rales CVS: S1 S2 irregular, no murmurs.  Abdomen: soft, BS +, non tender, non distended.  Extremities: Unna boots in place Neurologic: Non Focal-but with generalized weakness  Skin: No Rash.  Wounds: N/A.   Intake/Output from previous day:  Intake/Output Summary (Last 24 hours) at 08/15/15 1327 Last data filed at 08/15/15 1323  Gross per 24 hour  Intake  240 ml  Output   1390 ml  Net  -1150 ml     LAB RESULTS: CBC  Recent Labs Lab 08/11/15 1616 08/12/15 0308 08/13/15 0745 08/15/15 0317  WBC 6.1 9.4 5.0 5.5  HGB 10.9* 11.2* 10.1* 10.8*  HCT 37.1* 37.5* 33.1* 35.4*  PLT 93* 106* 92* 101*  MCV 101.9* 99.7 100.6* 99.7  MCH 29.9 29.8 30.7 30.4  MCHC 29.4* 29.9* 30.5 30.5  RDW 16.1* 16.1* 16.2* 15.7*    Chemistries   Recent Labs Lab 08/11/15 1616 08/12/15 0308 08/13/15 0745  08/14/15 0456 08/15/15 0317  NA 136  --  135 135 137  K 4.8  --  3.9 4.6 4.7  CL 98*  --  95* 95* 95*  CO2 27  --  34* 30 32  GLUCOSE 221*  --  155* 176* 127*  BUN 41*  --  39* 47* 49*  CREATININE 1.56* 1.36* 1.67* 2.09* 2.31*  CALCIUM 9.0  --  9.0 8.9 9.1    CBG:  Recent Labs Lab 08/14/15 1142 08/14/15 1711 08/14/15 2148 08/15/15 0734 08/15/15 1120  GLUCAP 204* 157* 152* 120* 190*    GFR Estimated Creatinine Clearance: 36.2 mL/min (by C-G formula based on Cr of 2.31).  Coagulation profile  Recent Labs Lab 08/12/15 1555  INR 1.43    Cardiac Enzymes  Recent Labs Lab 08/12/15 0308 08/12/15 0726 08/12/15 1255  TROPONINI 0.09* 0.08* 0.07*    Invalid input(s): POCBNP No results for input(s): DDIMER in the last 72 hours.  Recent Labs  08/13/15 0745  HGBA1C 6.8*    Recent Labs  08/14/15 0456  CHOL 108  HDL 26*  LDLCALC 65  TRIG 87  CHOLHDL 4.2   No results for input(s): TSH, T4TOTAL, T3FREE, THYROIDAB in the last 72 hours.  Invalid input(s): FREET3 No results for input(s): VITAMINB12, FOLATE, FERRITIN, TIBC, IRON, RETICCTPCT in the last 72 hours. No results for input(s): LIPASE, AMYLASE in the last 72 hours.  Urine Studies No results for input(s): UHGB, CRYS in the last 72 hours.  Invalid input(s): UACOL, UAPR, USPG, UPH, UTP, UGL, UKET, UBIL, UNIT, UROB, ULEU, UEPI, UWBC, URBC, UBAC, CAST, UCOM, BILUA  MICROBIOLOGY: Recent Results (from the past 240 hour(s))  MRSA PCR Screening     Status: None   Collection Time: 08/12/15  2:19 AM  Result Value Ref Range Status   MRSA by PCR NEGATIVE NEGATIVE Final    Comment:        The GeneXpert MRSA Assay (FDA approved for NASAL specimens only), is one component of a comprehensive MRSA colonization surveillance program. It is not intended to diagnose MRSA infection nor to guide or monitor treatment for MRSA infections.   Culture, blood (routine x 2) Call MD if unable to obtain prior to  antibiotics being given     Status: None (Preliminary result)   Collection Time: 08/12/15  3:00 AM  Result Value Ref Range Status   Specimen Description BLOOD LEFT HAND  Final   Special Requests BOTTLES DRAWN AEROBIC AND ANAEROBIC 5CC  Final   Culture NO GROWTH 2 DAYS  Final   Report Status PENDING  Incomplete  Culture, blood (routine x 2) Call MD if unable to obtain prior to antibiotics being given     Status: None (Preliminary result)   Collection Time: 08/12/15  3:08 AM  Result Value Ref Range Status   Specimen Description BLOOD RIGHT HAND  Final   Special Requests IN PEDIATRIC BOTTLE [facility]  Final   Culture  NO GROWTH 2 DAYS  Final   Report Status PENDING  Incomplete    RADIOLOGY STUDIES/RESULTS: Dg Chest 2 View  08/11/2015  CLINICAL DATA:  Shortness of breath. Altered mental status. Choked while eating this morning. Possible aspiration. EXAM: CHEST  2 VIEW COMPARISON:  07/04/2015. FINDINGS: Stable enlarged cardiac silhouette and post CABG changes. Interval mild patchy and linear density at the right lung base. Stable prominence of the interstitial markings. Mild increase in prominence of the pulmonary vasculature. Breathing motion blurring on the lateral view. IMPRESSION: 1. Atelectasis at the right lung base. Superimposed aspiration is unlikely based on the current appearance. 2. Stable cardiomegaly and mild chronic interstitial lung disease with interval mild pulmonary vascular congestion. Electronically Signed   By: Claudie Revering M.D.   On: 08/11/2015 17:07   Ct Head Wo Contrast  08/11/2015  CLINICAL DATA:  Initial encounter for 69 YOM walking using his walker and fell backwards and hit his head today 08/11/15. EXAM: CT HEAD WITHOUT CONTRAST CT CERVICAL SPINE WITHOUT CONTRAST TECHNIQUE: Multidetector CT imaging of the head and cervical spine was performed following the standard protocol without intravenous contrast. Multiplanar CT image reconstructions of the cervical spine were also  generated. COMPARISON:  Head CT of 05/26/2015. No prior cervical spine imaging. FINDINGS: CT HEAD FINDINGS Sinuses/Soft tissues: Mild left parietal scalp soft tissue thickening on image/series 67/4. Right maxillary sinus mucous retention cyst or polyp. No skull fracture. Clear mastoid air cells. Intracranial: Cerebral atrophy. Moderate low density in the periventricular white matter likely related to small vessel disease. Left cerebellar remote infarct. No mass lesion, hemorrhage, hydrocephalus, acute infarct, intra-axial, or extra-axial fluid collection. CT CERVICAL SPINE FINDINGS Spinal visualization through the bottom of T4. Prevertebral soft tissues are within normal limits. Dense carotid atherosclerosis bilaterally. No apical pneumothorax. Advanced spondylosis. Left worse than right neural foraminal narrowing at multiple levels. Mild central canal stenosis, most significant at C3-4. Prior median sternotomy. Maintenance of vertebral body height. Trace C3-4 retrolisthesis. From C7 inferiorly are mildly degraded secondary to patient body habitus. Facets are well-aligned. Coronal reformats demonstrate a normal C1-C2 articulation. IMPRESSION: 1. Mild left parietal scalp soft tissue swelling. No acute intracranial abnormality. 2. Advanced cervical spondylosis. Trace C3-4 is favored to be degenerative. Otherwise, no acute findings in the cervical spine identified. 3.  Cerebral atrophy and small vessel ischemic change. Electronically Signed   By: Abigail Miyamoto M.D.   On: 08/11/2015 16:50   Ct Angio Chest Pe W/cm &/or Wo Cm  08/11/2015  CLINICAL DATA:  Worsening dyspnea.  Altered mental status. EXAM: CT ANGIOGRAPHY CHEST WITH CONTRAST TECHNIQUE: Multidetector CT imaging of the chest was performed using the standard protocol during bolus administration of intravenous contrast. Multiplanar CT image reconstructions and MIPs were obtained to evaluate the vascular anatomy. CONTRAST:  17mL OMNIPAQUE IOHEXOL 350 MG/ML SOLN  COMPARISON:  08/28/2014 FINDINGS: Cardiovascular: There is good opacification of the pulmonary arteries. There is no pulmonary embolism. The thoracic aorta is normal in caliber and intact. There is extensive calcified coronary artery plaque. There are multiple coronary artery bypass grafts. Lungs: There are multifocal confluent airspace opacities, central predominant. Considerations include alveolar edema, infectious infiltrate. Hemorrhage or neoplasm less likely but not entirely excluded. Central airways: Patent Effusions: None Lymphadenopathy: Mildly prominent hilar and mediastinal nodes, more likely reactive Esophagus: Unremarkable Upper abdomen: Unremarkable Musculoskeletal: No significant abnormality except for moderate thoracic degenerative disc disease, kyphosis, and prior sternotomy. Review of the MIP images confirms the above findings. IMPRESSION: 1. Negative for acute pulmonary embolism  2. Confluent airspace opacities bilaterally, right worse than left. Infectious infiltrate is a leading consideration. Alveolar edema less likely but not excluded. There also are mildly prominent hilar and mediastinal nodes which may be reactive. Electronically Signed   By: Andreas Newport M.D.   On: 08/11/2015 23:21   Ct Cervical Spine Wo Contrast  08/11/2015  CLINICAL DATA:  Initial encounter for 69 YOM walking using his walker and fell backwards and hit his head today 08/11/15. EXAM: CT HEAD WITHOUT CONTRAST CT CERVICAL SPINE WITHOUT CONTRAST TECHNIQUE: Multidetector CT imaging of the head and cervical spine was performed following the standard protocol without intravenous contrast. Multiplanar CT image reconstructions of the cervical spine were also generated. COMPARISON:  Head CT of 05/26/2015. No prior cervical spine imaging. FINDINGS: CT HEAD FINDINGS Sinuses/Soft tissues: Mild left parietal scalp soft tissue thickening on image/series 67/4. Right maxillary sinus mucous retention cyst or polyp. No skull  fracture. Clear mastoid air cells. Intracranial: Cerebral atrophy. Moderate low density in the periventricular white matter likely related to small vessel disease. Left cerebellar remote infarct. No mass lesion, hemorrhage, hydrocephalus, acute infarct, intra-axial, or extra-axial fluid collection. CT CERVICAL SPINE FINDINGS Spinal visualization through the bottom of T4. Prevertebral soft tissues are within normal limits. Dense carotid atherosclerosis bilaterally. No apical pneumothorax. Advanced spondylosis. Left worse than right neural foraminal narrowing at multiple levels. Mild central canal stenosis, most significant at C3-4. Prior median sternotomy. Maintenance of vertebral body height. Trace C3-4 retrolisthesis. From C7 inferiorly are mildly degraded secondary to patient body habitus. Facets are well-aligned. Coronal reformats demonstrate a normal C1-C2 articulation. IMPRESSION: 1. Mild left parietal scalp soft tissue swelling. No acute intracranial abnormality. 2. Advanced cervical spondylosis. Trace C3-4 is favored to be degenerative. Otherwise, no acute findings in the cervical spine identified. 3.  Cerebral atrophy and small vessel ischemic change. Electronically Signed   By: Abigail Miyamoto M.D.   On: 08/11/2015 16:50   Mr Brain Wo Contrast  08/12/2015  CLINICAL DATA:  Initial evaluation for syncope. EXAM: MRI HEAD WITHOUT CONTRAST TECHNIQUE: Multiplanar, multiecho pulse sequences of the brain and surrounding structures were obtained without intravenous contrast. COMPARISON:  Prior CT from on 08/11/2015. FINDINGS: Diffuse prominence of the CSF containing spaces is compatible with generalized age-related cerebral atrophy. Patchy T2/FLAIR hyperintensity within the periventricular and deep white matter both cerebral hemispheres most likely related chronic small vessel ischemic disease. Encephalomalacia within the left cerebellar hemisphere consistent with remote infarct. Associated chronic hemosiderin  staining present within this region. Probable remote infarct within the anteromedial left frontal lobe as well. There is a possible tiny punctate 4 mm focus of high signal intensity on DWI sequence involving the cortical gray matter of the posterior right frontal region (series 3, image 31 on axial sequence, series 5, image 14 on coronal sequence). Finding may reflect a tiny acute ischemic infarct. Corresponding signal loss not definitely seen on ADC map, although this is felt to be too small to be resolved. No other definite acute intracranial infarct. Abnormal flow void within the distal left vertebral artery, which may related to focal plaque or possibly chronic dissection (series 8, image 3), similar to prior. Major intracranial vascular flow voids otherwise maintained. The right vertebral artery is diminutive. No acute intracranial hemorrhage. No mass lesion, midline shift, or mass effect. No hydrocephalus. No extra-axial fluid collection. Craniocervical junction grossly within normal limits. Degenerative spondylolysis noted within the partially visualized upper cervical spine. Pituitary gland grossly normal. No acute abnormality about the orbits. Sequela prior lens  extraction present on the left. Mild mucosal thickening within the maxillary sinuses and ethmoidal air cells. Paranasal sinuses are otherwise clear. Minimal opacity within the right mastoid air cells. Inner ear structures normal. Bone marrow signal intensity within normal limits. No scalp soft tissue abnormality. IMPRESSION: 1. Question 4 mm punctate focus of high signal intensity on DWI sequence within the posterior right frontal lobe. Finding favored to reflect artifact, although possible tiny acute ischemic infarct is not entirely excluded, and could be considered in the correct clinical setting. 2. No other acute intracranial process. 3. 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 4. Abnormal flow void  within the distal left vertebral artery, which may be related to focal plaque or possibly chronic dissection. This is similar relative to prior study. 5. Age-related cerebral atrophy with mild to moderate chronic small vessel ischemic disease. Electronically Signed   By: Jeannine Boga M.D.   On: 08/12/2015 02:40   US Renal  08/14/2015  CLINICAL DATA:  69 year old male with acute renal failure EXAM: RENAL / URINARY TRACT ULTRASOUND COMPLETE COMPARISON:  CT scan of the abdomen and pelvis 07/01/2015 FINDINGS: Right Kidney: Length: 12.2 cm. Technically challenging examination secondary to patient body habitus and diffuse edema. The right kidney is very poorly seen. No obvious hydronephrosis. Left Kidney: Length: 12.3 cm. Poorly seen. No hydronephrosis. 4.3 cm anechoic simple cyst in the interpolar region. Bladder: Not visualized. IMPRESSION: 1. Technically limited study secondary to patient body habitus and diffuse soft tissue edema. 2. The right kidney is very poorly seen.  No obvious hydronephrosis. 3. No left-sided hydronephrosis. 4. Simple renal cysts on the left. Electronically Signed   By: Jacqulynn Cadet M.D.   On: 08/14/2015 21:25   Dg Chest Port 1 View  08/15/2015  CLINICAL DATA:  Short of breath, COPD, asthma EXAM: PORTABLE CHEST 1 VIEW COMPARISON:  Radiograph 08/12/2015 FINDINGS: Sternotomy wires overlie normal cardiac silhouette. Linear opacity at the RIGHT lung base again demonstrated slightly improved. Patchy perihilar airspace disease again demonstrated. No pneumothorax. IMPRESSION: 1. Slight improvement in density of RIGHT lower lobe atelectasis versus infiltrate. 2. No change in perihilar opacities representing mild edema or infection. Electronically Signed   By: Suzy Bouchard M.D.   On: 08/15/2015 08:02   Dg Abd Acute W/chest  08/12/2015  CLINICAL DATA:  Episode of nausea and vomiting earlier, no abdominal pain, history of bowel resection and breast malignancy. EXAM: DG ABDOMEN  ACUTE W/ 1V CHEST COMPARISON:  Portable chest x-ray of August 11, 2015 and chest CT scan of the same day FINDINGS: The lungs are borderline hypoinflated. Confluent alveolar opacities are present at both lung bases greater on the right than on the left. A trace of pleural fluid on the right is suspected. The cardiac silhouette is mildly enlarged. The central pulmonary vascularity is engorged and indistinct. The patient has undergone previous CABG. The stool burden within the colon is mildly increased. There is a moderate amount of gas within loops of the sigmoid colon. There is no small or large bowel obstructive pattern and no evidence of perforation. There is degenerative change of the lower lumbar spine. There is contrast within the urinary bladder from the earlier CT scan. There is splenic arterial calcification. IMPRESSION: 1. Patchy airspace opacities bilaterally consistent with pneumonia. A small amount of pleural fluid is likely present on the right. 2. CHF with mild pulmonary vascular congestion. 3. No acute intra-abdominal abnormality is observed. Increased colonic stool burden may reflect constipation in the appropriate  clinical setting. Electronically Signed   By: David  Martinique M.D.   On: 08/12/2015 08:16    Oren Binet, MD  [facility] Pager:[phone]  If 7PM-7AM, please contact night-coverage [email or web address] Password [email or web address] 08/15/2015, 1:27 PM   LOS: 4 days

## 2015-08-15 NOTE — Evaluation (Signed)
Speech Language Pathology Evaluation Patient Details Name: [name] MRN: [id number] DOB: [birth year] Today's Date: 08/15/2015 Time: 0932-6712 SLP Time Calculation (min) (ACUTE ONLY): 25 min  Problem List:  Patient Active Problem List   Diagnosis Date Noted  . ARF (acute renal failure) ([place])   . Acute on chronic diastolic heart failure ([place])   . Faintness   . Stroke (cerebrum) ([place])   . Acute encephalopathy 08/12/2015  . Hypoxemia requiring supplemental oxygen 08/11/2015  . A-fib ([place]) 08/11/2015  . Acute respiratory failure with hypoxia ([place]) 08/11/2015  . Sepsis ([place]) 07/01/2015  . Sepsis due to cellulitis ([place]) 07/01/2015  . SOB (shortness of breath)   . Acute on chronic diastolic CHF (congestive heart failure), NYHA class 3 ([place])   . Encephalopathy, metabolic   . Hypomagnesemia 05/27/2015  . History of male breast cancer, s/p R mastectomy Jan 2016 05/27/2015  . General weakness 05/27/2015  . Generalized weakness 05/26/2015  . Hypokalemia 10/03/2014  . Congestive heart failure ([place]) 10/02/2014  . Lower extremity pain, left 10/02/2014  . Diabetes mellitus type 2, controlled ([place]) 10/02/2014  . Enterocutaneous fistula   . Hypertension 08/30/2014  . COPD (chronic obstructive pulmonary disease) ([place]) 08/30/2014  . Intracerebral hemorrhage ([place]) 08/01/2014  . Osteoarthritis of both hands 07/30/2014  . Thrush, oral 07/28/2014  . Debility 07/25/2014  . Diabetic polyneuropathy associated with type 2 diabetes mellitus ([place]) 07/25/2014  . Gout flare 07/24/2014  . Pre-operative cardiovascular examination 07/15/2014  . Family history of malignant neoplasm of gastrointestinal tract 06/12/2014  . Panniculitis 04/14/2013  . Seasonal allergies 12/20/2012  . L1 vertebral fracture ([place]) 09/07/2012  . Hypothyroidism 08/30/2012  . ETOH abuse   . PAF (paroxysmal atrial fibrillation) ([place])   . Chronic diastolic heart failure ([place])   . DM (diabetes mellitus) type II uncontrolled,  periph vascular disorder ([place]) 06/20/2009  . Obstructive sleep apnea 06/20/2009  . CAROTID STENOSIS 06/20/2009  . CHRONIC OBSTRUCTIVE PULMONARY DISEASE 06/20/2009  . Coronary atherosclerosis of native coronary artery 06/20/2009  . Hyperlipidemia   . Hypertensive heart disease    Past Medical History:  Past Medical History  Diagnosis Date  . CHRONIC OBSTRUCTIVE PULMONARY DISEASE 06/20/2009  . CAROTID STENOSIS 06/20/2009    A. 08/2001 s/p L CEA;  B.   09/14/11 - Carotid U/S - 40-59% bilateral stenosis, left CEA patch angioplasty is patent  . DM 06/20/2009  . CAD 06/20/2009    A.  08/2000 - s/p CABG x 4 - LIMA-LAD, Left Radial-OM, VG-DIAG, VG-RCA;  B. Neg. MV  2010  . HYPERLIPIDEMIA 06/20/2009  . HYPERTENSION 06/20/2009  . Hypothyroidism   . Low back pain   . Asthma     as child  . Pneumonia   . Persistent atrial fibrillation (HCC)     Not felt to be coumadin candidate 2/2 ETOH use.  Marland Kitchen History of tobacco abuse     remote - quit 1970  . Morbidly obese ([place])   . Chronic diastolic heart failure, NYHA class 3 ([place])     Followed by CHF Clinic --> Echo 04/2014: EF 45-80%, Diastolic DysFxn - elevated LVEDP & LAP. Mod Dil LA & RA.  . Falls frequently   . Hx of cardiovascular stress test 05/2014    Lexiscan Myoview (8/15):  No ischemia; EF 63% - Normal Study  . Breast cancer, right breast ([place])   . Gout   . OBSTRUCTIVE SLEEP APNEA 06/20/2009    does not use cpap  . Stroke [facility])  found on MRI in Oct. 2015.   Marland Kitchen Shortness of breath dyspnea     occasional  . Depression     takes Prozac  . GERD (gastroesophageal reflux disease)     takes Prilosec  . Arthritis   . Neuromuscular disorder (HCC)     neuropathy in both legs  . Constipation   . ETOH abuse     no alcohol since Oct. 2015  . 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 CHF (congestive heart failure) ([place])   . Supplemental oxygen dependent     hs  . Bilateral renal cysts   .  Kidney disease     Stage 3   Past Surgical History:  Past Surgical History  Procedure Laterality Date  . Carotid endarterectomy  2002    left  . Coronary artery bypass graft      x 4 - 2001  . Umbilical hernia repair N/A 01/28/2014    Procedure: HERNIA REPAIR UMBILICAL ADULT/INC;  Surgeon: Harl Bowie, MD;  Location: [place];  Service: General;  Laterality: N/A;  . Laparotomy N/A 01/28/2014    Procedure: EXPLORATORY LAPAROTOMY;  Surgeon: Harl Bowie, MD;  Location: [place];  Service: General;  Laterality: N/A;  . Bowel resection N/A 01/28/2014    Procedure: SMALL BOWEL RESECTION;  Surgeon: Harl Bowie, MD;  Location: [place];  Service: General;  Laterality: N/A;  . Hernia repair    . Colonoscopy    . Total mastectomy Right 11/11/2014    Procedure: RIGHT TOTAL MASTECTOMY;  Surgeon: Excell Seltzer, MD;  Location: [place];  Service: General;  Laterality: Right;  . Cataract extraction w/phaco Left 04/08/2015    Procedure: CATARACT EXTRACTION PHACO AND INTRAOCULAR LENS PLACEMENT ([place]);  Surgeon: Birder Robson, MD;  Location: [facility];  Service: Ophthalmology;  Laterality: Left;  [place] 00:36 AP% 23.7 CDE 8.72 fluid pack ALP#[id number] H  . Eye surgery Left     Catarct  . Laparotomy N/A 06/02/2015    Procedure: EXPLORATORY LAPAROTOMY;  Surgeon: Coralie Keens, MD;  Location: [place];  Service: General;  Laterality: N/A;  . Bowel resection N/A 06/02/2015    Procedure: SMALL BOWEL RESECTION;  Surgeon: Coralie Keens, MD;  Location: [place];  Service: General;  Laterality: N/A;   HPI:  69 yo male who looks older than stated age has new acute respiratory failure after episode of sepsis from cellulitis and new R frontal punctate infarct. PHx: COPD, HTN, PNA, falls   Assessment / Plan / Recommendation Clinical Impression  Patient presents with a mild cognitive impairment which appears to be exacerbated at this time by his lethargy, fatigue. Patient had difficulty maintaining alertness,  with his eyes closing intermittently throughout assessment. Patient's is likely at baseline cognitive functioning, however, as he has a h/o cognitive impairments and was in Inpatient Rehab here at [facility] in early September. Patient exhibits delayed processing, decreased safety awareness, decreased memory storage and retrieval.     SLP Assessment  Patient does not need any further Speech Lanaguage Pathology Services    Follow Up Recommendations  24 hour supervision/assistance;Skilled Nursing facility (SNF vs Home with 24 hour supervision (depending on familiy's ability to care for him))    Frequency and Duration        Pertinent Vitals/Pain Pain Assessment: 0-10 Pain Score:  ("I dont know" when asked about pain rating) Pain Intervention(s): Other (comment) (informed Engineer, manufacturing)   SLP Goals  Patient/Family Stated  Goal: patient wants to be able to urinate, because he feels it is causing his discomfort  SLP Evaluation Prior Functioning  Cognitive/Linguistic Baseline: Baseline deficits Baseline deficit details: Patient was in inpatient rehab at [facility] hospital for 9 days in September of this year. Deficits at discharge included attention, following multiple step commands, etc. Clinician recommended [facility] SLP therapy, however unsure if this was completed. Type of Home: House  Lives With: Daughter Available Help at Discharge: Family;Available 24 hours/day Education: ?6th grade or when he was 70 years old  Vocation: Retired   Associate Professor  Overall Cognitive Status: History of cognitive impairments - at baseline Arousal/Alertness: Awake/alert Orientation Level: Oriented to person;Oriented to place;Disoriented to time;Disoriented to situation Attention: Sustained Sustained Attention:  (difficult to assess secondary to patient's alertness fluctuating) Sustained Attention Impairment: Verbal basic;Verbal complex Memory: Impaired Memory Impairment: Storage deficit;Retrieval deficit Decreased Short Term Memory:  Verbal basic;Verbal complex Awareness Impairment: Emergent impairment Problem Solving: Impaired Problem Solving Impairment: Verbal basic Executive Function: Reasoning Reasoning: Impaired Reasoning Impairment: Functional basic (patient attempted to get out of bed so he could stand to relieve pressure of bladder, however SLP helped reposition him in bed and he did not try to get out of bed again) Safety/Judgment: Impaired Comments: Patient has h/o cognitive deficits as per SLP's notes from his inpatient rehab stay back in early September of this year. Patient was discharged home with family to assist and continued to require minimal assist/cues for memory, problem solving, and following multistep commands    Comprehension  Auditory Comprehension Overall Auditory Comprehension: Appears within functional limits for tasks assessed Yes/No Questions: Within Functional Limits Basic Immediate Environment Questions: 75-100% accurate One Step Basic Commands: 75-100% accurate Two Step Basic Commands: 75-100% accurate Multistep Basic Commands: Not tested Conversation: Simple Interfering Components: Attention;Working Marine scientist;Other (comment);Processing speed (alertness) EffectiveTechniques: Extra processing time;Repetition    Expression Expression Primary Mode of Expression: Verbal Verbal Expression Overall Verbal Expression: Appears within functional limits for tasks assessed   Oral / Motor Oral Motor/Sensory Function Overall Oral Motor/Sensory Function: Appears within functional limits for tasks assessed Motor Speech Overall Motor Speech: Appears within functional limits for tasks assessed   [place], MA, CCC-SLP 08/15/2015 3:46 PM

## 2015-08-15 NOTE — Progress Notes (Signed)
Patient Name: [name] Date of Encounter: 08/15/2015  Principal Problem:   Acute respiratory failure with hypoxia ([place]) Active Problems:   Obstructive sleep apnea   Coronary atherosclerosis of native coronary artery   PAF (paroxysmal atrial fibrillation) (HCC)   Hypothyroidism   COPD (chronic obstructive pulmonary disease) (HCC)   Enterocutaneous fistula   Diabetes mellitus type 2, controlled ([place])   A-fib (HCC)   Acute encephalopathy   Stroke (cerebrum) (HCC)   ARF (acute renal failure) (HCC)   Acute on chronic diastolic heart failure ([place])   Faintness     Primary Cardiologist: Dr. Angelena Form Patient Profile: 69 yo male w/ hx CABG 2001, AFib (no anticoag 2nd ETOH), COPD, OSA, CKD III, D-CHF, L-CEA, hernia>>resection>>c/b enterocutaneous fistula (01/2014) Admitted 10/31 pm w/ SOB. Cards saw 11/02 for CHF  SUBJECTIVE: Denies any chest pain or palpitations. Says he is still having dyspnea. More alert and oriented today.   OBJECTIVE Filed Vitals:   08/14/15 2053 08/15/15 0500 08/15/15 0903 08/15/15 0935  BP: 122/90 145/87  142/77  Pulse: 82 84  92  Temp: 97.5 F (36.4 C) 97.9 F (36.6 C)    TempSrc: Oral Oral    Resp: 23 18    Weight:  254 lb 3.2 oz (115.304 kg)    SpO2: 92% 95% 93%     Intake/Output Summary (Last 24 hours) at 08/15/15 1336 Last data filed at 08/15/15 1323  Gross per 24 hour  Intake    240 ml  Output   1390 ml  Net  -1150 ml   Filed Weights   08/13/15 0400 08/14/15 0616 08/15/15 0500  Weight: 255 lb 9.6 oz (115.939 kg) 254 lb 6.4 oz (115.395 kg) 254 lb 3.2 oz (115.304 kg)    PHYSICAL EXAM General: Well developed, well nourished, male in no acute distress. Head: Normocephalic, atraumatic.  Neck: Supple without bruits, JVD elevated. Lungs:  Resp regular and unlabored, Rales at bases bilaterally. Heart: Irregularly irregular, S1, S2, no S3, S4, or murmur; no rub. Abdomen: Soft, non-tender, non-distended with normoactive bowel sounds.  No hepatomegaly. No rebound/guarding. No obvious abdominal masses. Extremities:  Lower extremities in [place] boots bilaterally. Neuro: Alert and oriented X 3. Moves all extremities spontaneously. Psych: Normal affect.    LABS: CBC: Recent Labs  08/13/15 0745 08/15/15 0317  WBC 5.0 5.5  HGB 10.1* 10.8*  HCT 33.1* 35.4*  MCV 100.6* 99.7  PLT 92* 101*   INR: Recent Labs  08/12/15 1555  INR 1.06   Basic Metabolic Panel: Recent Labs  08/14/15 0456 08/15/15 0317  NA 135 137  K 4.6 4.7  CL 95* 95*  CO2 30 32  GLUCOSE 176* 127*  BUN 47* 49*  CREATININE 2.09* 2.31*  CALCIUM 8.9 9.1   BNP:  B NATRIURETIC PEPTIDE  Date/Time Value Ref Range Status  08/14/2015 02:16 PM 584.6* 0.0 - 100.0 pg/mL Final  08/11/2015 04:16 PM 299.1* 0.0 - 100.0 pg/mL Final   Hemoglobin A1C: Recent Labs  08/13/15 0745  HGBA1C 6.8*   Fasting Lipid Panel: Recent Labs  08/14/15 0456  CHOL 108  HDL 26*  LDLCALC 65  TRIG 87  CHOLHDL 4.2   TELE:  Atrial fibrillation with rate in 80's - 90's      ECHO: 06/12/2015 Study Conclusions - Left ventricle: The cavity size was normal. Systolic function was normal. The estimated ejection fraction was in the range of 60% to 65%. Wall motion was normal; there were no regional wall motion abnormalities. -  Aortic valve: Valve mobility was restricted. There was mild stenosis. Peak velocity (S): 248 cm/s. - Mitral valve: Calcified annulus. Mildly thickened, mildly calcified leaflets . - Left atrium: The atrium was moderately dilated. - Right ventricle: The cavity size was mildly dilated. Wall thickness was normal. - Right atrium: The atrium was moderately dilated.  Radiology/Studies: US Renal: 08/14/2015  CLINICAL DATA:  69 year old male with acute renal failure EXAM: RENAL / URINARY TRACT ULTRASOUND COMPLETE COMPARISON:  CT scan of the abdomen and pelvis 07/01/2015 FINDINGS: Right Kidney: Length: 12.2 cm. Technically challenging  examination secondary to patient body habitus and diffuse edema. The right kidney is very poorly seen. No obvious hydronephrosis. Left Kidney: Length: 12.3 cm. Poorly seen. No hydronephrosis. 4.3 cm anechoic simple cyst in the interpolar region. Bladder: Not visualized. IMPRESSION: 1. Technically limited study secondary to patient body habitus and diffuse soft tissue edema. 2. The right kidney is very poorly seen.  No obvious hydronephrosis. 3. No left-sided hydronephrosis. 4. Simple renal cysts on the left. Electronically Signed   By: Jacqulynn Cadet M.D.   On: 08/14/2015 21:25   Dg Chest Port 1 View: 08/15/2015  CLINICAL DATA:  Short of breath, COPD, asthma EXAM: PORTABLE CHEST 1 VIEW COMPARISON:  Radiograph 08/12/2015 FINDINGS: Sternotomy wires overlie normal cardiac silhouette. Linear opacity at the RIGHT lung base again demonstrated slightly improved. Patchy perihilar airspace disease again demonstrated. No pneumothorax. IMPRESSION: 1. Slight improvement in density of RIGHT lower lobe atelectasis versus infiltrate. 2. No change in perihilar opacities representing mild edema or infection. Electronically Signed   By: Suzy Bouchard M.D.   On: 08/15/2015 08:02   Current Medications:  . allopurinol  300 mg Oral Daily  . aspirin EC  325 mg Oral Daily  . atorvastatin  20 mg Oral Daily  . budesonide (PULMICORT) nebulizer solution  0.25 mg Nebulization BID  . carvedilol  6.25 mg Oral BID WC  . colchicine  0.6 mg Oral Daily  . enoxaparin (LOVENOX) injection  40 mg Subcutaneous Q24H  . FLUoxetine  20 mg Oral Daily  . folic acid  1 mg Oral Daily  . gabapentin  300 mg Oral BID  . hydrALAZINE  25 mg Oral 3 times per day  . insulin aspart  0-9 Units Subcutaneous TID WC  . ipratropium-albuterol  3 mL Nebulization TID  . isosorbide dinitrate  10 mg Oral TID  . levothyroxine  187 mcg Oral QAC breakfast  . magnesium oxide  400 mg Oral BID  . pantoprazole  40 mg Oral Daily  . piperacillin-tazobactam  (ZOSYN)  IV  3.375 g Intravenous 3 times per day  . potassium chloride  20 mEq Oral QHS  . potassium chloride SA  40 mEq Oral Daily  . tamoxifen  20 mg Oral QHS  . thiamine  100 mg Oral Daily  . venlafaxine XR  37.5 mg Oral Daily      ASSESSMENT AND PLAN:  1. Acute Respiratory Failure w/ Hypoxia:  - likely multifactorial from probable Aspiration PNA and acute diastolic CHF.   2. PNA:  - on antibiotics. Management per IM.   3. Acute on Chronic Diastolic CHF:  - EF on echo 06/2015 was normal at 55-60%.  - His weight at OV with Richardson Dopp, 07/22/15, was 242 lb. His weight today is 254 lb.  - BNP at time of admit was elevated at 299. 584 on 08/14/2015. - Renal function worsened with diuresis, IV lasix d/c'd on 08/13/2015. - Check daily weights. Strict I/Os, but  may not be accurate as no UOP recorded 7a-7p yesterday. - recently started on Hydralazine 25mg  Q8H and Isordil 10mg  Q8H on 08/14/2015.  - obtaining RHC on 08/15/2015 at 1500. The patient and his daughter were educated on this procedure and made aware of the date and time.  4. Chronic Atrial Fibrillation:  - rate is well controlled on Coreg.  - CHA2DS2-VASc Score and unadjusted Ischemic Stroke Rate (% per year) is equal to 9.7 % stroke rate/year from a score of 6. Per clinic notes, no anticoagulation due to alcohol abuse and h/o falls. Continue high dose ASA.   5. CAD:  - h/o CABG in 2001.  - NST in 2015 was negative for ischemia.  - He denies CP. Continue ASA, statin and BB.   6. Acute encephalopathy:  - MRI of brain performed and showed punctate infarct in the right frontal lobe. Seen by neurology today.  - It was noted, "MRI findings of stroke are incidental in nature and likely not the etiology of his acute encephalopathy. Suspect this is more related to hypoxia and respiratory distress in the setting of pneumonia".  - Their recommendations are for continuation of high dose ASA given his afib and h/o recent  falls and EtOH use, making him a poor candidate for anticoagulation. Neurology has signed off.  Signed, Erma Heritage , PA-C 1:36 PM 08/15/2015 Pager: [phone]

## 2015-08-15 NOTE — Clinical Social Work Note (Signed)
Clinical Social Worker continuing to follow patient and family for support and discharge planning needs.  CSW spoke with patient daughter, [name], over the phone to provide requested bed offer at [facility] ([place]).  Patient daughter continues to request inpatient rehab with plans to bring patient home, however does remain agreeable with SNF placement if needed.  CSW remains available for support and to facilitate patient discharge needs once medically stable.  Barbette Or, [place]

## 2015-08-15 NOTE — Progress Notes (Signed)
Orthopedic Tech Progress Note Patient Details:  [name] 1946-04-27 [id number]  Ortho Devices Type of Ortho Device: Louretta Parma boot Ortho Device/Splint Location: bilateral Ortho Device/Splint Interventions: Application   Gibran Veselka 08/15/2015, 1:10 PM

## 2015-08-15 NOTE — Progress Notes (Signed)
Baltazar Najjar, NP paged regarding new onset course crackles all lung fields upon assessment. New scheduled diuretic to be administered tonight. Will continue to monitor closely.

## 2015-08-15 NOTE — Progress Notes (Signed)
Pt is refusing CPAP for tonight.

## 2015-08-15 NOTE — Progress Notes (Signed)
Physical Therapy Treatment Patient Details Name: [name] MRN: [id number] DOB: 1946-02-28 Today's Date: 08/15/2015    History of Present Illness 69 yo male who looks older than stated age has new acute respiratory failure after episode of sepsis from cellulitis and new R frontal punctate infarct.  PHx:  COPD, HTN, PNA, falls    PT Comments    Patient progressing with mobility and able to achieve min A for supine to sit and improved standing transfers.  Do continue to feel SNF rehab most appropriate as pt with decreased safety and high fall risk at this time.  Follow Up Recommendations  SNF     Equipment Recommendations  None recommended by PT    Recommendations for Other Services       Precautions / Restrictions Precautions Precautions: Fall    Mobility  Bed Mobility Overal bed mobility: Needs Assistance Bed Mobility: Supine to Sit     Supine to sit: HOB elevated;Min assist Sit to supine: Min guard   General bed mobility comments: assist mainly due to mulitple lines, pulls up with assist from my hand, cues for technique; to supine with guiding assist for LE's  Transfers Overall transfer level: Needs assistance Equipment used: Rolling walker (2 wheeled) Transfers: Sit to/from Stand Sit to Stand: Mod assist         General transfer comment: standing initially leaning with LE's braced on bed and holding walker, cues for hand placement  Ambulation/Gait Ambulation/Gait assistance: Min assist Ambulation Distance (Feet): 24 Feet Assistive device: Rolling walker (2 wheeled) Gait Pattern/deviations: Step-through pattern;Shuffle;Decreased stride length;Wide base of support     General Gait Details: LOB posterior at times when turning and pt fearful of walking too close to walker relates he falls backwards   Stairs            Wheelchair Mobility    Modified Rankin (Stroke Patients Only) Modified Rankin (Stroke Patients Only) Pre-Morbid Rankin Score:  Moderate disability Modified Rankin: Moderately severe disability     Balance Overall balance assessment: Needs assistance   Sitting balance-Leahy Scale: Fair     Standing balance support: Bilateral upper extremity supported Standing balance-Leahy Scale: Poor Standing balance comment: UE support and assist with pt leaning with legs against bed initially                    Cognition Arousal/Alertness: Awake/alert Behavior During Therapy: Anxious Overall Cognitive Status: No family/caregiver present to determine baseline cognitive functioning   Orientation Level: Disoriented to;Time Current Attention Level: Sustained Memory: Decreased short-term memory Following Commands: Follows one step commands consistently;Follows multi-step commands with increased time     Problem Solving: Requires verbal cues General Comments: perseverates on fluid overload    Exercises      General Comments        Pertinent Vitals/Pain Pain Score: 0-No pain    Home Living                      Prior Function            PT Goals (current goals can now be found in the care plan section) Progress towards PT goals: Progressing toward goals;Goals met and updated - see care plan    Frequency  Min 2X/week    PT Plan Current plan remains appropriate    Co-evaluation             End of Session Equipment Utilized During Treatment: Oxygen;Gait belt Activity Tolerance: Patient tolerated treatment  well Patient left: in bed;with call bell/phone within reach     Time: 1130-1155 PT Time Calculation (min) (ACUTE ONLY): 25 min  Charges:  $Gait Training: 8-22 mins $Therapeutic Activity: 8-22 mins                    G Codes:      [name],[name] 08-30-2015, 1:24 PM  [place], [place] 08-30-15

## 2015-08-15 NOTE — H&P (View-Only) (Signed)
Patient Name: [name] Date of Encounter: 08/14/2015  Principal Problem:   Acute respiratory failure with hypoxia [facility]) Active Problems:   Obstructive sleep apnea   Coronary atherosclerosis of native coronary artery   PAF (paroxysmal atrial fibrillation) (HCC)   Hypothyroidism   COPD (chronic obstructive pulmonary disease) (HCC)   Enterocutaneous fistula   Diabetes mellitus type 2, controlled ([place])   A-fib ([place])   Acute encephalopathy   Stroke (cerebrum) [facility])   Primary Cardiologist: Dr Angelena Form CHF: Dr Aundra Dubin  Patient Profile: 68 yo male w/ hx CABG 2001, AFib (no anticoag 2nd ETOH), COPD, OSA, CKD III, D-CHF, L-CEA, hernia>>resection>>c/b enterocutaneous fistula (01/2014)  Admitted 10/31 pm w/ SOB. Cards saw 11/02 for CHF  SUBJECTIVE: Awake but confused, does not know where he is  OBJECTIVE Filed Vitals:   08/14/15 0400 08/14/15 0616 08/14/15 0831 08/14/15 1021  BP: 147/79  150/86   Pulse: 91  95   Temp: 97.8 F (36.6 C)     TempSrc: Oral     Resp: 15     Weight:  254 lb 6.4 oz (115.395 kg)    SpO2: 95%   91%    Intake/Output Summary (Last 24 hours) at 08/14/15 1350 Last data filed at 08/14/15 1300  Gross per 24 hour  Intake    600 ml  Output    501 ml  Net     99 ml   Filed Weights   08/12/15 0143 08/13/15 0400 08/14/15 0616  Weight: 253 lb (114.76 kg) 255 lb 9.6 oz (115.939 kg) 254 lb 6.4 oz (115.395 kg)    PHYSICAL EXAM General: Well developed, well nourished, male in no acute distress. Head: Normocephalic, atraumatic.  Neck: Supple without bruits, JVD 10 cm. Lungs:  Resp regular and unlabored, decreased BS bases w/ rales. Heart: Irreg irreg, S1, S2, no S3, S4, soft murmur; no rub. Abdomen: Soft, non-tender, non-distended, BS + x 4.  Extremities: No clubbing, cyanosis, + edema, degree unclear as legs are wrapped.  Neuro: Alert and oriented X 1. Moves all extremities spontaneously. Psych: Normal affect.  LABS: CBC: Recent Labs   08/12/15 0308 08/13/15 0745  WBC 9.4 5.0  HGB 11.2* 10.1*  HCT 37.5* 33.1*  MCV 99.7 100.6*  PLT 106* 92*   INR: Recent Labs  08/12/15 1555  INR 7.59   Basic Metabolic Panel: Recent Labs  08/13/15 0745 08/14/15 0456  NA 135 135  K 3.9 4.6  CL 95* 95*  CO2 34* 30  GLUCOSE 155* 176*  BUN 39* 47*  CREATININE 1.67* 2.09*  CALCIUM 9.0 8.9   Cardiac Enzymes: Recent Labs  08/12/15 0308 08/12/15 0726 08/12/15 1255  TROPONINI 0.09* 0.08* 0.07*    Recent Labs  08/11/15 1616  TROPIPOC 0.02   BNP:  B NATRIURETIC PEPTIDE  Date/Time Value Ref Range Status  08/11/2015 04:16 PM 299.1* 0.0 - 100.0 pg/mL Final  07/01/2015 04:27 PM 331.3* 0.0 - 100.0 pg/mL Final   Hemoglobin A1C: Recent Labs  08/13/15 0745  HGBA1C 6.8*   Fasting Lipid Panel: Recent Labs  08/14/15 0456  CHOL 108  HDL 26*  LDLCALC 65  TRIG 87  CHOLHDL 4.2   Thyroid Function Tests: Recent Labs  08/12/15 0308  TSH 0.976   TELE:  Atrial fib, rate generally controlled      Radiology/Studies: Dg Chest 2 View 08/11/2015  CLINICAL DATA:  Shortness of breath. Altered mental status. Choked while eating this morning. Possible aspiration. EXAM: CHEST  2 VIEW COMPARISON:  07/04/2015. FINDINGS: Stable enlarged cardiac silhouette and post CABG changes. Interval mild patchy and linear density at the right lung base. Stable prominence of the interstitial markings. Mild increase in prominence of the pulmonary vasculature. Breathing motion blurring on the lateral view. IMPRESSION: 1. Atelectasis at the right lung base. Superimposed aspiration is unlikely based on the current appearance. 2. Stable cardiomegaly and mild chronic interstitial lung disease with interval mild pulmonary vascular congestion. Electronically Signed   By: Claudie Revering M.D.   On: 08/11/2015 17:07   Ct Head Wo Contrast 08/11/2015  CLINICAL DATA:  Initial encounter for 69 YOM walking using his walker and fell backwards and hit his head today  08/11/15. EXAM: CT HEAD WITHOUT CONTRAST CT CERVICAL SPINE WITHOUT CONTRAST TECHNIQUE: Multidetector CT imaging of the head and cervical spine was performed following the standard protocol without intravenous contrast. Multiplanar CT image reconstructions of the cervical spine were also generated. COMPARISON:  Head CT of 05/26/2015. No prior cervical spine imaging. FINDINGS: CT HEAD FINDINGS Sinuses/Soft tissues: Mild left parietal scalp soft tissue thickening on image/series 67/4. Right maxillary sinus mucous retention cyst or polyp. No skull fracture. Clear mastoid air cells. Intracranial: Cerebral atrophy. Moderate low density in the periventricular white matter likely related to small vessel disease. Left cerebellar remote infarct. No mass lesion, hemorrhage, hydrocephalus, acute infarct, intra-axial, or extra-axial fluid collection. CT CERVICAL SPINE FINDINGS Spinal visualization through the bottom of T4. Prevertebral soft tissues are within normal limits. Dense carotid atherosclerosis bilaterally. No apical pneumothorax. Advanced spondylosis. Left worse than right neural foraminal narrowing at multiple levels. Mild central canal stenosis, most significant at C3-4. Prior median sternotomy. Maintenance of vertebral body height. Trace C3-4 retrolisthesis. From C7 inferiorly are mildly degraded secondary to patient body habitus. Facets are well-aligned. Coronal reformats demonstrate a normal C1-C2 articulation. IMPRESSION: 1. Mild left parietal scalp soft tissue swelling. No acute intracranial abnormality. 2. Advanced cervical spondylosis. Trace C3-4 is favored to be degenerative. Otherwise, no acute findings in the cervical spine identified. 3.  Cerebral atrophy and small vessel ischemic change. Electronically Signed   By: Abigail Miyamoto M.D.   On: 08/11/2015 16:50   Ct Angio Chest Pe W/cm &/or Wo Cm 08/11/2015  CLINICAL DATA:  Worsening dyspnea.  Altered mental status. EXAM: CT ANGIOGRAPHY CHEST WITH CONTRAST  TECHNIQUE: Multidetector CT imaging of the chest was performed using the standard protocol during bolus administration of intravenous contrast. Multiplanar CT image reconstructions and MIPs were obtained to evaluate the vascular anatomy. CONTRAST:  23mL OMNIPAQUE IOHEXOL 350 MG/ML SOLN COMPARISON:  08/28/2014 FINDINGS: Cardiovascular: There is good opacification of the pulmonary arteries. There is no pulmonary embolism. The thoracic aorta is normal in caliber and intact. There is extensive calcified coronary artery plaque. There are multiple coronary artery bypass grafts. Lungs: There are multifocal confluent airspace opacities, central predominant. Considerations include alveolar edema, infectious infiltrate. Hemorrhage or neoplasm less likely but not entirely excluded. Central airways: Patent Effusions: None Lymphadenopathy: Mildly prominent hilar and mediastinal nodes, more likely reactive Esophagus: Unremarkable Upper abdomen: Unremarkable Musculoskeletal: No significant abnormality except for moderate thoracic degenerative disc disease, kyphosis, and prior sternotomy. Review of the MIP images confirms the above findings. IMPRESSION: 1. Negative for acute pulmonary embolism 2. Confluent airspace opacities bilaterally, right worse than left. Infectious infiltrate is a leading consideration. Alveolar edema less likely but not excluded. There also are mildly prominent hilar and mediastinal nodes which may be reactive. Electronically Signed   By: Andreas Newport M.D.   On: 08/11/2015 23:21  Ct Cervical Spine Wo Contrast 08/11/2015  CLINICAL DATA:  Initial encounter for 69 YOM walking using his walker and fell backwards and hit his head today 08/11/15. EXAM: CT HEAD WITHOUT CONTRAST CT CERVICAL SPINE WITHOUT CONTRAST TECHNIQUE: Multidetector CT imaging of the head and cervical spine was performed following the standard protocol without intravenous contrast. Multiplanar CT image reconstructions of the cervical  spine were also generated. COMPARISON:  Head CT of 05/26/2015. No prior cervical spine imaging. FINDINGS: CT HEAD FINDINGS Sinuses/Soft tissues: Mild left parietal scalp soft tissue thickening on image/series 67/4. Right maxillary sinus mucous retention cyst or polyp. No skull fracture. Clear mastoid air cells. Intracranial: Cerebral atrophy. Moderate low density in the periventricular white matter likely related to small vessel disease. Left cerebellar remote infarct. No mass lesion, hemorrhage, hydrocephalus, acute infarct, intra-axial, or extra-axial fluid collection. CT CERVICAL SPINE FINDINGS Spinal visualization through the bottom of T4. Prevertebral soft tissues are within normal limits. Dense carotid atherosclerosis bilaterally. No apical pneumothorax. Advanced spondylosis. Left worse than right neural foraminal narrowing at multiple levels. Mild central canal stenosis, most significant at C3-4. Prior median sternotomy. Maintenance of vertebral body height. Trace C3-4 retrolisthesis. From C7 inferiorly are mildly degraded secondary to patient body habitus. Facets are well-aligned. Coronal reformats demonstrate a normal C1-C2 articulation. IMPRESSION: 1. Mild left parietal scalp soft tissue swelling. No acute intracranial abnormality. 2. Advanced cervical spondylosis. Trace C3-4 is favored to be degenerative. Otherwise, no acute findings in the cervical spine identified. 3.  Cerebral atrophy and small vessel ischemic change. Electronically Signed   By: Abigail Miyamoto M.D.   On: 08/11/2015 16:50   Mr Brain Wo Contrast 08/12/2015  CLINICAL DATA:  Initial evaluation for syncope. EXAM: MRI HEAD WITHOUT CONTRAST TECHNIQUE: Multiplanar, multiecho pulse sequences of the brain and surrounding structures were obtained without intravenous contrast. COMPARISON:  Prior CT from on 08/11/2015. FINDINGS: Diffuse prominence of the CSF containing spaces is compatible with generalized age-related cerebral atrophy. Patchy  T2/FLAIR hyperintensity within the periventricular and deep white matter both cerebral hemispheres most likely related chronic small vessel ischemic disease. Encephalomalacia within the left cerebellar hemisphere consistent with remote infarct. Associated chronic hemosiderin staining present within this region. Probable remote infarct within the anteromedial left frontal lobe as well. There is a possible tiny punctate 4 mm focus of high signal intensity on DWI sequence involving the cortical gray matter of the posterior right frontal region (series 3, image 31 on axial sequence, series 5, image 14 on coronal sequence). Finding may reflect a tiny acute ischemic infarct. Corresponding signal loss not definitely seen on ADC map, although this is felt to be too small to be resolved. No other definite acute intracranial infarct. Abnormal flow void within the distal left vertebral artery, which may related to focal plaque or possibly chronic dissection (series 8, image 3), similar to prior. Major intracranial vascular flow voids otherwise maintained. The right vertebral artery is diminutive. No acute intracranial hemorrhage. No mass lesion, midline shift, or mass effect. No hydrocephalus. No extra-axial fluid collection. Craniocervical junction grossly within normal limits. Degenerative spondylolysis noted within the partially visualized upper cervical spine. Pituitary gland grossly normal. No acute abnormality about the orbits. Sequela prior lens extraction present on the left. Mild mucosal thickening within the maxillary sinuses and ethmoidal air cells. Paranasal sinuses are otherwise clear. Minimal opacity within the right mastoid air cells. Inner ear structures normal. Bone marrow signal intensity within normal limits. No scalp soft tissue abnormality. IMPRESSION: 1. Question 4 mm punctate focus of  high signal intensity on DWI sequence within the posterior right frontal lobe. Finding favored to reflect artifact,  although possible tiny acute ischemic infarct is not entirely excluded, and could be considered in the correct clinical setting. 2. No other acute intracranial process. 3. 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 4. Abnormal flow void within the distal left vertebral artery, which may be related to focal plaque or possibly chronic dissection. This is similar relative to prior study. 5. Age-related cerebral atrophy with mild to moderate chronic small vessel ischemic disease. Electronically Signed   By: Jeannine Boga M.D.   On: 08/12/2015 02:40   Dg Abd Acute W/chest 08/12/2015  CLINICAL DATA:  Episode of nausea and vomiting earlier, no abdominal pain, history of bowel resection and breast malignancy. EXAM: DG ABDOMEN ACUTE W/ 1V CHEST COMPARISON:  Portable chest x-ray of August 11, 2015 and chest CT scan of the same day FINDINGS: The lungs are borderline hypoinflated. Confluent alveolar opacities are present at both lung bases greater on the right than on the left. A trace of pleural fluid on the right is suspected. The cardiac silhouette is mildly enlarged. The central pulmonary vascularity is engorged and indistinct. The patient has undergone previous CABG. The stool burden within the colon is mildly increased. There is a moderate amount of gas within loops of the sigmoid colon. There is no small or large bowel obstructive pattern and no evidence of perforation. There is degenerative change of the lower lumbar spine. There is contrast within the urinary bladder from the earlier CT scan. There is splenic arterial calcification. IMPRESSION: 1. Patchy airspace opacities bilaterally consistent with pneumonia. A small amount of pleural fluid is likely present on the right. 2. CHF with mild pulmonary vascular congestion. 3. No acute intra-abdominal abnormality is observed. Increased colonic stool burden may reflect constipation in the appropriate clinical setting.  Electronically Signed   By: David  Martinique M.D.   On: 08/12/2015 08:16   Current Medications:  . allopurinol  300 mg Oral Daily  . aspirin EC  325 mg Oral Daily  . atorvastatin  20 mg Oral Daily  . budesonide (PULMICORT) nebulizer solution  0.25 mg Nebulization BID  . carvedilol  6.25 mg Oral BID WC  . colchicine  0.6 mg Oral Daily  . enoxaparin (LOVENOX) injection  40 mg Subcutaneous Q24H  . FLUoxetine  20 mg Oral Daily  . folic acid  1 mg Oral Daily  . gabapentin  300 mg Oral BID  . insulin aspart  0-9 Units Subcutaneous TID WC  . ipratropium-albuterol  3 mL Nebulization TID  . levothyroxine  187 mcg Oral QAC breakfast  . magnesium oxide  400 mg Oral BID  . pantoprazole  40 mg Oral Daily  . piperacillin-tazobactam (ZOSYN)  IV  3.375 g Intravenous 3 times per day  . potassium chloride  20 mEq Oral QHS  . potassium chloride SA  40 mEq Oral Daily  . tamoxifen  20 mg Oral QHS  . thiamine  100 mg Oral Daily  . venlafaxine XR  37.5 mg Oral Daily      ASSESSMENT AND PLAN: 1. Acute Respiratory Failure w/ Hypoxia:  - likely multifactorial from probable Aspiration PNA and acute diastolic CHF.   2. PNA:  - on antibiotics. Management per IM.   3. A/C Diastolic CHF:  - EF on echo 06/2015 was normal at 55-60%.  - His weight at OV with Richardson Dopp, 07/22/15, was 242 lb. His weight  today is 254 lb.  - Bilateral LEE noted on physical exam, wrapped.  - BNP at time of admit was elevated at 299. - Renal function worsened with diuresis, IV lasix d/c'd yesterday - continue to monitor renal function closely. Low sodium diet.  - Check daily weights. Strict I/Os, but may not be accurate as no UOP recorded 7a-7p yesterday - est CVP on echo 06/12/2015 was 8  4. Chronic Atrial Fibrillation:  - rate is well controlled on Coreg.  - Per clinic notes, no anticoagulation due to alcohol abuse and h/o falls. Continue high dose ASA.  - CHA2DS2-VASc Score and unadjusted Ischemic Stroke Rate (% per year)  is equal to 9.7 % stroke rate/year from a score of 6  5. CAD:  - h/o CABG in 2001.  - NST in 2015 was negative for ischemia.  - He denies CP. Continue ASA, statin and BB.   6. Acute encephalopathy:  - MRI of brain performed and showed punctate infarct in the right frontal lobe. Seen by neurology today.  - It was noted, "MRI findings of stroke are incidental in nature and likely not the etiology of his acute encephalopathy. Suspect this is more related to hypoxia and respiratory distress in the setting of pneumonia".  - Their recommendations are for continuation of high dose ASA given his afib and h/o recent falls and EtOH use, making him a poor candidate for anticoagulation. Neurology has signed off.  Principal Problem:   Acute respiratory failure with hypoxia (HCC) Active Problems:   Obstructive sleep apnea   Coronary atherosclerosis of native coronary artery   PAF (paroxysmal atrial fibrillation) (HCC)   Hypothyroidism   COPD (chronic obstructive pulmonary disease) (HCC)   Enterocutaneous fistula   Diabetes mellitus type 2, controlled ([place])   A-fib (HCC)   Acute encephalopathy   Stroke (cerebrum) (HCC)   Signed, Nayab Aten , PA-C 1:50 PM 08/14/2015

## 2015-08-15 NOTE — Interval H&P Note (Signed)
History and Physical Interval Note:  08/15/2015 3:08 PM  [name]  has presented today for surgery, with the diagnosis of cp  The various methods of treatment have been discussed with the patient and family. After consideration of risks, benefits and other options for treatment, the patient has consented to  Procedure(s): Right Heart Cath (N/A) as a surgical intervention .  The patient's history has been reviewed, patient examined, no change in status, stable for surgery.  I have reviewed the patient's chart and labs.  Questions were answered to the patient's satisfaction.     Virginie Josten S.

## 2015-08-16 ENCOUNTER — Other Ambulatory Visit (HOSPITAL_COMMUNITY): Payer: Medicare Other

## 2015-08-16 ENCOUNTER — Inpatient Hospital Stay (HOSPITAL_COMMUNITY): Payer: Medicare Other

## 2015-08-16 DIAGNOSIS — N179 Acute kidney failure, unspecified: Secondary | ICD-10-CM

## 2015-08-16 DIAGNOSIS — G9340 Encephalopathy, unspecified: Secondary | ICD-10-CM

## 2015-08-16 DIAGNOSIS — G4733 Obstructive sleep apnea (adult) (pediatric): Secondary | ICD-10-CM

## 2015-08-16 DIAGNOSIS — I25812 Atherosclerosis of bypass graft of coronary artery of transplanted heart without angina pectoris: Secondary | ICD-10-CM

## 2015-08-16 DIAGNOSIS — I4891 Unspecified atrial fibrillation: Secondary | ICD-10-CM

## 2015-08-16 LAB — GLUCOSE, CAPILLARY
GLUCOSE-CAPILLARY: 129 mg/dL — AB (ref 65–99)
GLUCOSE-CAPILLARY: 206 mg/dL — AB (ref 65–99)
Glucose-Capillary: 200 mg/dL — ABNORMAL HIGH (ref 65–99)
Glucose-Capillary: 149 mg/dL — ABNORMAL HIGH (ref 65–99)

## 2015-08-16 LAB — BASIC METABOLIC PANEL
Anion gap: 10 (ref 5–15)
BUN: 46 mg/dL — ABNORMAL HIGH (ref 6–20)
CALCIUM: 9.3 mg/dL (ref 8.9–10.3)
CO2: 30 mmol/L (ref 22–32)
CREATININE: 2.15 mg/dL — AB (ref 0.61–1.24)
Chloride: 98 mmol/L — ABNORMAL LOW (ref 101–111)
GFR calc non Af Amer: 29 mL/min — ABNORMAL LOW (ref >60)
GFR, EST AFRICAN AMERICAN: 34 mL/min — AB (ref >60)
Glucose, Bld: 147 mg/dL — ABNORMAL HIGH (ref 65–99)
Potassium: 4.6 mmol/L (ref 3.5–5.1)
SODIUM: 138 mmol/L (ref 135–145)

## 2015-08-16 MED ORDER — HYDRALAZINE HCL 50 MG PO TABS
50.0000 mg | ORAL_TABLET | Freq: Three times a day (TID) | ORAL | Status: DC
  Administered 2015-08-16 – 2015-08-17 (×3): 50 mg via ORAL
  Filled 2015-08-16 (×3): qty 1

## 2015-08-16 MED ORDER — FUROSEMIDE 10 MG/ML IJ SOLN
80.0000 mg | Freq: Two times a day (BID) | INTRAMUSCULAR | Status: DC
  Administered 2015-08-16 – 2015-08-20 (×9): 80 mg via INTRAVENOUS
  Filled 2015-08-16 (×9): qty 8

## 2015-08-16 NOTE — Progress Notes (Signed)
ANTIBIOTIC CONSULT NOTE - INITIAL  Pharmacy Consult for Zosyn  Indication: rule out pneumonia  Allergies  Allergen Reactions  . Erythromycin Hives    Oramycin    Patient Measurements: Weight: 254 lb 3.2 oz (115.304 kg)  Vital Signs: Temp: 98 F (36.7 C) (11/05 0500) Temp Source: Oral (11/05 0500) BP: 154/80 mmHg (11/05 0500) Pulse Rate: 90 (11/05 0500) Intake/Output from previous day: 11/04 0701 - 11/05 0700 In: 480 [P.O.:480] Out: 2640 [Urine:2640] Intake/Output from this shift: Total I/O In: -  Out: 300 [Urine:300]  Labs:  Recent Labs  08/14/15 0456 08/15/15 0317 08/16/15 0245  WBC  --  5.5  --   HGB  --  10.8*  --   PLT  --  101*  --   CREATININE 2.09* 2.31* 2.15*   Estimated Creatinine Clearance: 38.9 mL/min (by C-G formula based on Cr of 2.15).  Medical History: Past Medical History  Diagnosis Date  . CHRONIC OBSTRUCTIVE PULMONARY DISEASE 06/20/2009  . CAROTID STENOSIS 06/20/2009    A. 08/2001 s/p L CEA;  B.   09/14/11 - Carotid U/S - 40-59% bilateral stenosis, left CEA patch angioplasty is patent  . DM 06/20/2009  . CAD 06/20/2009    A.  08/2000 - s/p CABG x 4 - LIMA-LAD, Left Radial-OM, VG-DIAG, VG-RCA;  B. Neg. MV  2010  . HYPERLIPIDEMIA 06/20/2009  . HYPERTENSION 06/20/2009  . Hypothyroidism   . Low back pain   . Asthma     as child  . Pneumonia   . Persistent atrial fibrillation (HCC)     Not felt to be coumadin candidate 2/2 ETOH use.  Marland Kitchen History of tobacco abuse     remote - quit 1970  . Morbidly obese ([place])   . Chronic diastolic heart failure, NYHA class 3 ([place])     Followed by CHF Clinic --> Echo 04/2014: EF 62-83%, Diastolic DysFxn - elevated LVEDP & LAP. Mod Dil LA & RA.  . Falls frequently   . Hx of cardiovascular stress test 05/2014    Lexiscan Myoview (8/15):  No ischemia; EF 63% - Normal Study  . Breast cancer, right breast ([place])   . Gout   . OBSTRUCTIVE SLEEP APNEA 06/20/2009    does not use cpap  . Stroke [facility])     found on MRI in  Oct. 2015.   Marland Kitchen Shortness of breath dyspnea     occasional  . Depression     takes Prozac  . GERD (gastroesophageal reflux disease)     takes Prilosec  . Arthritis   . Neuromuscular disorder (HCC)     neuropathy in both legs  . Constipation   . ETOH abuse     no alcohol since Oct. 2015  . 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 CHF (congestive heart failure) ([place])   . Supplemental oxygen dependent     hs  . Bilateral renal cysts   . Kidney disease     Stage 3   Assessment: 69 y/o M here with syncopal episode, found to have bilateral airspace opacities on CT. Continuing on Zosyn D#4 for PNA vs. CHF. Afeb, wbc wnl.  11/1 vanc>>11/3 11/1 zosyn>>  11/1 BCx2>>ngtd 11/1 sputum>> MRSA pcr neg  Goal of Therapy:  Vancomycin trough level 15-20 mcg/ml  Plan:  -Zosyn 3.375G IV q8h to be infused over 4 hours -Trend WBC, temp, renal function -Drug levels as indicated   Elicia Lamp, PharmD Clinical  Pharmacist Pager [phone] 08/16/2015 9:18 AM

## 2015-08-16 NOTE — Progress Notes (Signed)
Pt refusing CPAP at this time.

## 2015-08-16 NOTE — Progress Notes (Signed)
SUBJECTIVE: Says breathing better than this morning.     Intake/Output Summary (Last 24 hours) at 08/16/15 1027 Last data filed at 08/16/15 0811  Gross per 24 hour  Intake    480 ml  Output   2490 ml  Net  -2010 ml    Current Facility-Administered Medications  Medication Dose Route Frequency Provider Last Rate Last Dose  . 0.9 %  sodium chloride infusion  250 mL Intravenous PRN Jettie Booze, MD      . 0.9 %  sodium chloride infusion  250 mL Intravenous PRN Jettie Booze, MD      . acetaminophen (TYLENOL) tablet 650 mg  650 mg Oral Q4H PRN Jettie Booze, MD      . acetaZOLAMIDE (DIAMOX) tablet 500 mg  500 mg Oral BID Skeet Latch, MD   500 mg at 08/15/15 2257  . albuterol (PROVENTIL) (2.5 MG/3ML) 0.083% nebulizer solution 2.5 mg  2.5 mg Nebulization Q4H PRN Jonetta Osgood, MD      . allopurinol (ZYLOPRIM) tablet 300 mg  300 mg Oral Daily Rise Patience, MD   300 mg at 08/15/15 0935  . aspirin EC tablet 325 mg  325 mg Oral Daily Rise Patience, MD   325 mg at 08/15/15 0934  . atorvastatin (LIPITOR) tablet 20 mg  20 mg Oral Daily Rise Patience, MD   20 mg at 08/15/15 0934  . budesonide (PULMICORT) nebulizer solution 0.25 mg  0.25 mg Nebulization BID Rise Patience, MD   0.25 mg at 08/16/15 0842  . carvedilol (COREG) tablet 6.25 mg  6.25 mg Oral BID WC Rise Patience, MD   6.25 mg at 08/16/15 0913  . colchicine tablet 0.6 mg  0.6 mg Oral Daily Rise Patience, MD   0.6 mg at 08/15/15 0934  . enoxaparin (LOVENOX) injection 40 mg  40 mg Subcutaneous Q24H Rise Patience, MD   40 mg at 08/15/15 7672  . FLUoxetine (PROZAC) capsule 20 mg  20 mg Oral Daily Rise Patience, MD   20 mg at 08/15/15 0933  . folic acid (FOLVITE) tablet 1 mg  1 mg Oral Daily Rise Patience, MD   1 mg at 08/15/15 0934  . furosemide (LASIX) injection 80 mg  80 mg Intravenous BID Jonetta Osgood, MD   80 mg at 08/16/15 0913  . gabapentin  (NEURONTIN) capsule 300 mg  300 mg Oral BID Rise Patience, MD   300 mg at 08/15/15 2257  . hydrALAZINE (APRESOLINE) tablet 25 mg  25 mg Oral 3 times per day Skeet Latch, MD   25 mg at 08/16/15 0513  . insulin aspart (novoLOG) injection 0-9 Units  0-9 Units Subcutaneous TID WC Rise Patience, MD   1 Units at 08/16/15 0913  . ipratropium-albuterol (DUONEB) 0.5-2.5 (3) MG/3ML nebulizer solution 3 mL  3 mL Nebulization TID Jonetta Osgood, MD   3 mL at 08/16/15 0842  . isosorbide dinitrate (ISORDIL) tablet 10 mg  10 mg Oral TID Skeet Latch, MD   10 mg at 08/15/15 2256  . levothyroxine (SYNTHROID, LEVOTHROID) tablet 187 mcg  187 mcg Oral QAC breakfast Rise Patience, MD   187 mcg at 08/16/15 0820  . magnesium oxide (MAG-OX) tablet 400 mg  400 mg Oral BID Rise Patience, MD   400 mg at 08/15/15 2258  . ondansetron (ZOFRAN) injection 4 mg  4 mg Intravenous Q6H PRN Jettie Booze,  MD      . oxyCODONE (Oxy IR/ROXICODONE) immediate release tablet 10 mg  10 mg Oral Q8H PRN Rise Patience, MD   10 mg at 08/15/15 1235  . pantoprazole (PROTONIX) EC tablet 40 mg  40 mg Oral Daily Rise Patience, MD   40 mg at 08/15/15 0936  . piperacillin-tazobactam (ZOSYN) IVPB 3.375 g  3.375 g Intravenous 3 times per day Erenest Blank, RPH   3.375 g at 08/16/15 0513  . potassium chloride SA (K-DUR,KLOR-CON) CR tablet 20 mEq  20 mEq Oral QHS Rise Patience, MD   20 mEq at 08/15/15 2257  . potassium chloride SA (K-DUR,KLOR-CON) CR tablet 40 mEq  40 mEq Oral Daily Rise Patience, MD   40 mEq at 08/15/15 0935  . senna-docusate (Senokot-S) tablet 1 tablet  1 tablet Oral QHS PRN Rise Patience, MD      . sodium chloride 0.9 % injection 3 mL  3 mL Intravenous Q12H Jettie Booze, MD   3 mL at 08/15/15 1945  . sodium chloride 0.9 % injection 3 mL  3 mL Intravenous PRN Jettie Booze, MD      . sodium chloride 0.9 % injection 3 mL  3 mL Intravenous Q12H Jettie Booze, MD      . sodium chloride 0.9 % injection 3 mL  3 mL Intravenous PRN Jettie Booze, MD      . tamoxifen (NOLVADEX) tablet 20 mg  20 mg Oral QHS Rise Patience, MD   20 mg at 08/15/15 2256  . thiamine (VITAMIN B-1) tablet 100 mg  100 mg Oral Daily Corky Sox, MD   100 mg at 08/15/15 0934  . venlafaxine XR (EFFEXOR-XR) 24 hr capsule 37.5 mg  37.5 mg Oral Daily Rise Patience, MD   37.5 mg at 08/15/15 0949    Filed Vitals:   08/15/15 2145 08/16/15 0500 08/16/15 0830 08/16/15 0843  BP:  154/80 161/87   Pulse:  90 89   Temp:  98 F (36.7 C)    TempSrc:  Oral    Resp:  15 19   Weight:      SpO2: 98% 99% 99% 97%    PHYSICAL EXAM General: NAD HEENT: Normal. Neck: JVD to angle of jaw.  Lungs: No rales. CV: Irregular rhythm, normal rate, normal S1/S2, no S3, no murmur.  Legs wrapped in ACE bandages, edematous. Abdomen: Soft, obese.  Neurologic: Alert.  Psych: Normal affect.  TELEMETRY: Reviewed telemetry pt in atrial fibrillation.  LABS: Basic Metabolic Panel:  Recent Labs  08/15/15 0317 08/16/15 0245  NA 137 138  K 4.7 4.6  CL 95* 98*  CO2 32 30  GLUCOSE 127* 147*  BUN 49* 46*  CREATININE 2.31* 2.15*  CALCIUM 9.1 9.3   Liver Function Tests: No results for input(s): AST, ALT, ALKPHOS, BILITOT, PROT, ALBUMIN in the last 72 hours. No results for input(s): LIPASE, AMYLASE in the last 72 hours. CBC:  Recent Labs  08/15/15 0317  WBC 5.5  HGB 10.8*  HCT 35.4*  MCV 99.7  PLT 101*   Cardiac Enzymes: No results for input(s): CKTOTAL, CKMB, CKMBINDEX, TROPONINI in the last 72 hours. BNP: Invalid input(s): POCBNP D-Dimer: No results for input(s): DDIMER in the last 72 hours. Hemoglobin A1C: No results for input(s): HGBA1C in the last 72 hours. Fasting Lipid Panel:  Recent Labs  08/14/15 0456  CHOL 108  HDL 26*  LDLCALC 65  TRIG 87  CHOLHDL  4.2   Thyroid Function Tests: No results for input(s): TSH, T4TOTAL, T3FREE, THYROIDAB in  the last 72 hours.  Invalid input(s): FREET3 Anemia Panel: No results for input(s): VITAMINB12, FOLATE, FERRITIN, TIBC, IRON, RETICCTPCT in the last 72 hours.  RADIOLOGY: Dg Chest 2 View  08/11/2015  CLINICAL DATA:  Shortness of breath. Altered mental status. Choked while eating this morning. Possible aspiration. EXAM: CHEST  2 VIEW COMPARISON:  07/04/2015. FINDINGS: Stable enlarged cardiac silhouette and post CABG changes. Interval mild patchy and linear density at the right lung base. Stable prominence of the interstitial markings. Mild increase in prominence of the pulmonary vasculature. Breathing motion blurring on the lateral view. IMPRESSION: 1. Atelectasis at the right lung base. Superimposed aspiration is unlikely based on the current appearance. 2. Stable cardiomegaly and mild chronic interstitial lung disease with interval mild pulmonary vascular congestion. Electronically Signed   By: Claudie Revering M.D.   On: 08/11/2015 17:07   Ct Head Wo Contrast  08/11/2015  CLINICAL DATA:  Initial encounter for 69 YOM walking using his walker and fell backwards and hit his head today 08/11/15. EXAM: CT HEAD WITHOUT CONTRAST CT CERVICAL SPINE WITHOUT CONTRAST TECHNIQUE: Multidetector CT imaging of the head and cervical spine was performed following the standard protocol without intravenous contrast. Multiplanar CT image reconstructions of the cervical spine were also generated. COMPARISON:  Head CT of 05/26/2015. No prior cervical spine imaging. FINDINGS: CT HEAD FINDINGS Sinuses/Soft tissues: Mild left parietal scalp soft tissue thickening on image/series 67/4. Right maxillary sinus mucous retention cyst or polyp. No skull fracture. Clear mastoid air cells. Intracranial: Cerebral atrophy. Moderate low density in the periventricular white matter likely related to small vessel disease. Left cerebellar remote infarct. No mass lesion, hemorrhage, hydrocephalus, acute infarct, intra-axial, or extra-axial fluid  collection. CT CERVICAL SPINE FINDINGS Spinal visualization through the bottom of T4. Prevertebral soft tissues are within normal limits. Dense carotid atherosclerosis bilaterally. No apical pneumothorax. Advanced spondylosis. Left worse than right neural foraminal narrowing at multiple levels. Mild central canal stenosis, most significant at C3-4. Prior median sternotomy. Maintenance of vertebral body height. Trace C3-4 retrolisthesis. From C7 inferiorly are mildly degraded secondary to patient body habitus. Facets are well-aligned. Coronal reformats demonstrate a normal C1-C2 articulation. IMPRESSION: 1. Mild left parietal scalp soft tissue swelling. No acute intracranial abnormality. 2. Advanced cervical spondylosis. Trace C3-4 is favored to be degenerative. Otherwise, no acute findings in the cervical spine identified. 3.  Cerebral atrophy and small vessel ischemic change. Electronically Signed   By: Abigail Miyamoto M.D.   On: 08/11/2015 16:50   Ct Angio Chest Pe W/cm &/or Wo Cm  08/11/2015  CLINICAL DATA:  Worsening dyspnea.  Altered mental status. EXAM: CT ANGIOGRAPHY CHEST WITH CONTRAST TECHNIQUE: Multidetector CT imaging of the chest was performed using the standard protocol during bolus administration of intravenous contrast. Multiplanar CT image reconstructions and MIPs were obtained to evaluate the vascular anatomy. CONTRAST:  82mL OMNIPAQUE IOHEXOL 350 MG/ML SOLN COMPARISON:  08/28/2014 FINDINGS: Cardiovascular: There is good opacification of the pulmonary arteries. There is no pulmonary embolism. The thoracic aorta is normal in caliber and intact. There is extensive calcified coronary artery plaque. There are multiple coronary artery bypass grafts. Lungs: There are multifocal confluent airspace opacities, central predominant. Considerations include alveolar edema, infectious infiltrate. Hemorrhage or neoplasm less likely but not entirely excluded. Central airways: Patent Effusions: None  Lymphadenopathy: Mildly prominent hilar and mediastinal nodes, more likely reactive Esophagus: Unremarkable Upper abdomen: Unremarkable Musculoskeletal: No significant abnormality except for  moderate thoracic degenerative disc disease, kyphosis, and prior sternotomy. Review of the MIP images confirms the above findings. IMPRESSION: 1. Negative for acute pulmonary embolism 2. Confluent airspace opacities bilaterally, right worse than left. Infectious infiltrate is a leading consideration. Alveolar edema less likely but not excluded. There also are mildly prominent hilar and mediastinal nodes which may be reactive. Electronically Signed   By: Andreas Newport M.D.   On: 08/11/2015 23:21   Ct Cervical Spine Wo Contrast  08/11/2015  CLINICAL DATA:  Initial encounter for 69 YOM walking using his walker and fell backwards and hit his head today 08/11/15. EXAM: CT HEAD WITHOUT CONTRAST CT CERVICAL SPINE WITHOUT CONTRAST TECHNIQUE: Multidetector CT imaging of the head and cervical spine was performed following the standard protocol without intravenous contrast. Multiplanar CT image reconstructions of the cervical spine were also generated. COMPARISON:  Head CT of 05/26/2015. No prior cervical spine imaging. FINDINGS: CT HEAD FINDINGS Sinuses/Soft tissues: Mild left parietal scalp soft tissue thickening on image/series 67/4. Right maxillary sinus mucous retention cyst or polyp. No skull fracture. Clear mastoid air cells. Intracranial: Cerebral atrophy. Moderate low density in the periventricular white matter likely related to small vessel disease. Left cerebellar remote infarct. No mass lesion, hemorrhage, hydrocephalus, acute infarct, intra-axial, or extra-axial fluid collection. CT CERVICAL SPINE FINDINGS Spinal visualization through the bottom of T4. Prevertebral soft tissues are within normal limits. Dense carotid atherosclerosis bilaterally. No apical pneumothorax. Advanced spondylosis. Left worse than right neural  foraminal narrowing at multiple levels. Mild central canal stenosis, most significant at C3-4. Prior median sternotomy. Maintenance of vertebral body height. Trace C3-4 retrolisthesis. From C7 inferiorly are mildly degraded secondary to patient body habitus. Facets are well-aligned. Coronal reformats demonstrate a normal C1-C2 articulation. IMPRESSION: 1. Mild left parietal scalp soft tissue swelling. No acute intracranial abnormality. 2. Advanced cervical spondylosis. Trace C3-4 is favored to be degenerative. Otherwise, no acute findings in the cervical spine identified. 3.  Cerebral atrophy and small vessel ischemic change. Electronically Signed   By: Abigail Miyamoto M.D.   On: 08/11/2015 16:50   Mr Brain Wo Contrast  08/12/2015  CLINICAL DATA:  Initial evaluation for syncope. EXAM: MRI HEAD WITHOUT CONTRAST TECHNIQUE: Multiplanar, multiecho pulse sequences of the brain and surrounding structures were obtained without intravenous contrast. COMPARISON:  Prior CT from on 08/11/2015. FINDINGS: Diffuse prominence of the CSF containing spaces is compatible with generalized age-related cerebral atrophy. Patchy T2/FLAIR hyperintensity within the periventricular and deep white matter both cerebral hemispheres most likely related chronic small vessel ischemic disease. Encephalomalacia within the left cerebellar hemisphere consistent with remote infarct. Associated chronic hemosiderin staining present within this region. Probable remote infarct within the anteromedial left frontal lobe as well. There is a possible tiny punctate 4 mm focus of high signal intensity on DWI sequence involving the cortical gray matter of the posterior right frontal region (series 3, image 31 on axial sequence, series 5, image 14 on coronal sequence). Finding may reflect a tiny acute ischemic infarct. Corresponding signal loss not definitely seen on ADC map, although this is felt to be too small to be resolved. No other definite acute  intracranial infarct. Abnormal flow void within the distal left vertebral artery, which may related to focal plaque or possibly chronic dissection (series 8, image 3), similar to prior. Major intracranial vascular flow voids otherwise maintained. The right vertebral artery is diminutive. No acute intracranial hemorrhage. No mass lesion, midline shift, or mass effect. No hydrocephalus. No extra-axial fluid collection. Craniocervical junction grossly within  normal limits. Degenerative spondylolysis noted within the partially visualized upper cervical spine. Pituitary gland grossly normal. No acute abnormality about the orbits. Sequela prior lens extraction present on the left. Mild mucosal thickening within the maxillary sinuses and ethmoidal air cells. Paranasal sinuses are otherwise clear. Minimal opacity within the right mastoid air cells. Inner ear structures normal. Bone marrow signal intensity within normal limits. No scalp soft tissue abnormality. IMPRESSION: 1. Question 4 mm punctate focus of high signal intensity on DWI sequence within the posterior right frontal lobe. Finding favored to reflect artifact, although possible tiny acute ischemic infarct is not entirely excluded, and could be considered in the correct clinical setting. 2. No other acute intracranial process. 3. 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 4. Abnormal flow void within the distal left vertebral artery, which may be related to focal plaque or possibly chronic dissection. This is similar relative to prior study. 5. Age-related cerebral atrophy with mild to moderate chronic small vessel ischemic disease. Electronically Signed   By: Jeannine Boga M.D.   On: 08/12/2015 02:40   US Renal  08/14/2015  CLINICAL DATA:  69 year old male with acute renal failure EXAM: RENAL / URINARY TRACT ULTRASOUND COMPLETE COMPARISON:  CT scan of the abdomen and pelvis 07/01/2015 FINDINGS: Right Kidney:  Length: 12.2 cm. Technically challenging examination secondary to patient body habitus and diffuse edema. The right kidney is very poorly seen. No obvious hydronephrosis. Left Kidney: Length: 12.3 cm. Poorly seen. No hydronephrosis. 4.3 cm anechoic simple cyst in the interpolar region. Bladder: Not visualized. IMPRESSION: 1. Technically limited study secondary to patient body habitus and diffuse soft tissue edema. 2. The right kidney is very poorly seen.  No obvious hydronephrosis. 3. No left-sided hydronephrosis. 4. Simple renal cysts on the left. Electronically Signed   By: Jacqulynn Cadet M.D.   On: 08/14/2015 21:25   Dg Chest Port 1 View  08/15/2015  CLINICAL DATA:  Short of breath, COPD, asthma EXAM: PORTABLE CHEST 1 VIEW COMPARISON:  Radiograph 08/12/2015 FINDINGS: Sternotomy wires overlie normal cardiac silhouette. Linear opacity at the RIGHT lung base again demonstrated slightly improved. Patchy perihilar airspace disease again demonstrated. No pneumothorax. IMPRESSION: 1. Slight improvement in density of RIGHT lower lobe atelectasis versus infiltrate. 2. No change in perihilar opacities representing mild edema or infection. Electronically Signed   By: Suzy Bouchard M.D.   On: 08/15/2015 08:02   Dg Abd Acute W/chest  08/12/2015  CLINICAL DATA:  Episode of nausea and vomiting earlier, no abdominal pain, history of bowel resection and breast malignancy. EXAM: DG ABDOMEN ACUTE W/ 1V CHEST COMPARISON:  Portable chest x-ray of August 11, 2015 and chest CT scan of the same day FINDINGS: The lungs are borderline hypoinflated. Confluent alveolar opacities are present at both lung bases greater on the right than on the left. A trace of pleural fluid on the right is suspected. The cardiac silhouette is mildly enlarged. The central pulmonary vascularity is engorged and indistinct. The patient has undergone previous CABG. The stool burden within the colon is mildly increased. There is a moderate amount of  gas within loops of the sigmoid colon. There is no small or large bowel obstructive pattern and no evidence of perforation. There is degenerative change of the lower lumbar spine. There is contrast within the urinary bladder from the earlier CT scan. There is splenic arterial calcification. IMPRESSION: 1. Patchy airspace opacities bilaterally consistent with pneumonia. A small amount of pleural fluid is likely present on the  right. 2. CHF with mild pulmonary vascular congestion. 3. No acute intra-abdominal abnormality is observed. Increased colonic stool burden may reflect constipation in the appropriate clinical setting. Electronically Signed   By: David  Martinique M.D.   On: 08/12/2015 08:16      ASSESSMENT AND PLAN: 1. Acute on chronic diastolic heart failure: Remains volume overloaded in spite of 2160 cc output in last 24 hours. RHC showed elevated R and L sided pressures with severe pulmonary hypertension. His renal function should improve with diuresis (Cr 2.31-->2.15 today, BUN 49-->46). He diuresed well with lasix 80 mg IV and is still very volume overloaded with LE edema and JVP nearly to his mandible. Now on Lasix 80 mg IV bid.  He is likely not a candidate for pulmonary vasodilators due to high wedge and underlying pulmonary disease. It is unclear if his underlying lung disease can be optimized. Awaiting repeat echo to assess for RV failure, as his echo 2 months ago did not report significant pulmonary hypertension, TR or RV failure. Based on the results of diuresis, he may benefit from a CHF consult.  2. Chronic atrial fibrillation: Rate controlled on Coreg. Per clinic notes, no anticoagulation due to alcohol abuse and h/o falls. Continue high dose ASA.   3. 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 Symptomatically stable from this standpoint. Continue ASA, statin and BB.   4. PNA: On Zosyn.  5. Essential HTN: BP still elevated. Will increase hydralazine to 50 mg  tid.  Kate Sable, M.D., F.A.C.C.

## 2015-08-16 NOTE — Progress Notes (Signed)
PATIENT DETAILS Name: [name] Age: 69 y.o. Sex: male Date of Birth: 08-01-1946 Admit Date: 08/11/2015 Admitting Physician Rise Patience, MD DPO:[id number],IRWER, MD  Brief narrative:  69 year old Caucasian male with history of chronic diastolic heart failure, prior history of alcohol abuse, chronic kidney disease stage III, diabetes, CAD status post CABG-admitted for evaluation of syncopal episode after vomiting. Found to have suspected aspiration pneumonia, acute diastolic heart failure causing acute hypoxic respiratory failure. Hospital course complicated by development of worsening renal failure. Cardiology performed [place] on 11/4 which showed severe pulmonary hypertension. See below for further details   Subjective: Less short of breath, less confused.  Assessment/Plan: Principal Problem: Acute respiratory failure with hypoxia: Suspect multifactorial-from probable aspiration pneumonia,  Acute diastolic heart failure, underlying pulmonary hypertension. Slowly improving with empiric Zosyn and Lasix. Titrate off oxygen as tolerated-follow  Active Problems: ?Syncope/Pre syncope: In a setting of vomiting-? Vagal. Telemetry-shows A. fib-but no other arrhythmias. Recent echocardiogram on 06/12/15 shows EF around 60-65%. EEG negative for seizure-like activity. Continue to monitor in telemetry. Cardiology following   Probable aspiration pneumonia: Improving, remains afebrile and without leukocytosis. Continue empiric Zosyn-plan on total 7 days of treatment. Blood cultures negative so far, urine streptococcal  and  Legionella antigen negative  Acute diastolic heart failure: Dry weight appears to be around 244 pounds-current weight 254 pounds/and remains volume overloaded-so far has a negative balance of 4.7 L.Seems to be diuresing well with 80 mg IV Lasix twice a day-will continue and follow volume status and electrolytes closely.   Acute on CKD stage 3: Acute renal  failure likely multifactorial in a setting of acute illness/prerenal azotemia/diuretics/possible contrast-induced nephropathy. Suspect CKD likely secondary to underlying diabetic nephropathy. Continue IV Lasix-as appears volume overloaded. Note renal ultrasound negative for hydronephrosis.   Acute encephalopathy:Remains mildly confused- suspect intermittent sundowning/delirium in the setting of undiagnosed mild dementia/cognitive dysfunction.Per patient and family last use of EtOH was at least 2-4 months back-therefore do not think has alcohol withdrawal.  Acute CVA: Suspect more of an incidental finding. Has a history of atrial fibrillation-considered a poor candidate for long-term anticoagulation given history of alcohol use and thrombocytopenia.CHA2DS2-VASc score 6.A1c 6.8, LDL 65. Carotid Doppler without significant stenosis.Continue with aspirin and statin.  Anemia/thrombocytopenia: Chronic issue, stable for outpatient work up  Minimally elevated troponins: May be demand ischemia or false positive elevation in the setting of chronic kidney disease. No workup required, continue with aspirin, statin and beta blockers  Atrial fibrillation: See above regarding anticoagulation-rate controlled with Coreg.  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 Chronic small ulceration in the anterior abdominal wall that appears stable  History of right breast cancer: Status post mastectomy, continue tamoxifen.Follow with Dr Lindi Adie  Essential hypertension:controlled, continue Coreg, hydralazine and Imdur. Follow BP   Type 2 diabetes: CBGs stable-with SSI.  A1c 6.8. Previously was on oral agents that was discontinued in the past because of hypoglycemia-will need to be reassessed on discharge   Hypothyroidism: Continue with Synthroid  COPD: Continue with bronchodilators-this appears stable  Gout: Continue  colchicine-appears stable  CAD status post CABG: Continue aspirin, statins and beta blockers  OSA: Related to obesity-continue CPAP  Deconditioning: Suspect underlying failure to thrive syndrome-Will likely require SNF on discharge   Disposition: Remain inpatient-not stable for discharge  Antimicrobial agents  See below  Anti-infectives    Start  Dose/Rate Route Frequency Ordered Stop   08/12/15 1000  vancomycin (VANCOCIN) IVPB 750 mg/150 ml premix  Status:  Discontinued     750 mg 150 mL/hr over 60 Minutes Intravenous Every 12 hours 08/12/15 0215 08/14/15 0906   08/12/15 0230  vancomycin (VANCOCIN) 1,250 mg in sodium chloride 0.9 % 250 mL IVPB     1,250 mg 166.7 mL/hr over 90 Minutes Intravenous  Once 08/12/15 0215 08/12/15 0542   08/12/15 0230  piperacillin-tazobactam (ZOSYN) IVPB 3.375 g     3.375 g 12.5 mL/hr over 240 Minutes Intravenous 3 times per day 08/12/15 0215        DVT Prophylaxis: Prophylactic Lovenox   Code Status: Full code   Family Communication Daughter over the phone  Procedures: None  CONSULTS:  cardiology and neurology  Time spent 30 minutes-Greater than 50% of this time was spent in counseling, explanation of diagnosis, planning of further management, and coordination of care.  MEDICATIONS: Scheduled Meds: . acetaZOLAMIDE  500 mg Oral BID  . allopurinol  300 mg Oral Daily  . aspirin EC  325 mg Oral Daily  . atorvastatin  20 mg Oral Daily  . budesonide (PULMICORT) nebulizer solution  0.25 mg Nebulization BID  . carvedilol  6.25 mg Oral BID WC  . colchicine  0.6 mg Oral Daily  . enoxaparin (LOVENOX) injection  40 mg Subcutaneous Q24H  . FLUoxetine  20 mg Oral Daily  . folic acid  1 mg Oral Daily  . furosemide  80 mg Intravenous BID  . gabapentin  300 mg Oral BID  . hydrALAZINE  50 mg Oral 3 times per day  . insulin aspart  0-9 Units Subcutaneous TID WC  . ipratropium-albuterol  3 mL Nebulization TID  . isosorbide dinitrate  10 mg  Oral TID  . levothyroxine  187 mcg Oral QAC breakfast  . magnesium oxide  400 mg Oral BID  . pantoprazole  40 mg Oral Daily  . piperacillin-tazobactam (ZOSYN)  IV  3.375 g Intravenous 3 times per day  . potassium chloride  20 mEq Oral QHS  . potassium chloride SA  40 mEq Oral Daily  . sodium chloride  3 mL Intravenous Q12H  . sodium chloride  3 mL Intravenous Q12H  . tamoxifen  20 mg Oral QHS  . thiamine  100 mg Oral Daily  . venlafaxine XR  37.5 mg Oral Daily   Continuous Infusions:  PRN Meds:.sodium chloride, sodium chloride, acetaminophen, albuterol, ondansetron (ZOFRAN) IV, oxyCODONE, senna-docusate, sodium chloride, sodium chloride    PHYSICAL EXAM: Vital signs in last 24 hours: Filed Vitals:   08/15/15 2145 08/16/15 0500 08/16/15 0830 08/16/15 0843  BP:  154/80 161/87   Pulse:  90 89   Temp:  98 F (36.7 C)    TempSrc:  Oral    Resp:  15 19   Weight:      SpO2: 98% 99% 99% 97%    Weight change:  Filed Weights   08/13/15 0400 08/14/15 0616 08/15/15 0500  Weight: 115.939 kg (255 lb 9.6 oz) 115.395 kg (254 lb 6.4 oz) 115.304 kg (254 lb 3.2 oz)   Body mass index is 38.66 kg/(m^2).   Gen Exam: much more awake and alert -speech clear Neck: Supple, No JVD.   Chest: Few bibasilar rales CVS: S1 S2 irregular, no murmurs.  Abdomen: soft, BS +, non tender, non distended.  Extremities: Unna boots in place Neurologic: Non Focal-but with generalized weakness  Skin: No Rash.  Wounds: N/A.  Intake/Output from previous day:  Intake/Output Summary (Last 24 hours) at 08/16/15 1133 Last data filed at 08/16/15 0811  Gross per 24 hour  Intake    480 ml  Output   2490 ml  Net  -2010 ml     LAB RESULTS: CBC  Recent Labs Lab 08/11/15 1616 08/12/15 0308 08/13/15 0745 08/15/15 0317  WBC 6.1 9.4 5.0 5.5  HGB 10.9* 11.2* 10.1* 10.8*  HCT 37.1* 37.5* 33.1* 35.4*  PLT 93* 106* 92* 101*  MCV 101.9* 99.7 100.6* 99.7  MCH 29.9 29.8 30.7 30.4  MCHC 29.4* 29.9* 30.5 30.5    RDW 16.1* 16.1* 16.2* 15.7*    Chemistries   Recent Labs Lab 08/11/15 1616 08/12/15 0308 08/13/15 0745 08/14/15 0456 08/15/15 0317 08/16/15 0245  NA 136  --  135 135 137 138  K 4.8  --  3.9 4.6 4.7 4.6  CL 98*  --  95* 95* 95* 98*  CO2 27  --  34* 30 32 30  GLUCOSE 221*  --  155* 176* 127* 147*  BUN 41*  --  39* 47* 49* 46*  CREATININE 1.56* 1.36* 1.67* 2.09* 2.31* 2.15*  CALCIUM 9.0  --  9.0 8.9 9.1 9.3    CBG:  Recent Labs Lab 08/15/15 0734 08/15/15 1120 08/15/15 1622 08/15/15 2156 08/16/15 0728  GLUCAP 120* 190* 162* 174* 129*    GFR Estimated Creatinine Clearance: 38.9 mL/min (by C-G formula based on Cr of 2.15).  Coagulation profile  Recent Labs Lab 08/12/15 1555  INR 1.43    Cardiac Enzymes  Recent Labs Lab 08/12/15 0308 08/12/15 0726 08/12/15 1255  TROPONINI 0.09* 0.08* 0.07*    Invalid input(s): POCBNP No results for input(s): DDIMER in the last 72 hours. No results for input(s): HGBA1C in the last 72 hours.  Recent Labs  08/14/15 0456  CHOL 108  HDL 26*  LDLCALC 65  TRIG 87  CHOLHDL 4.2   No results for input(s): TSH, T4TOTAL, T3FREE, THYROIDAB in the last 72 hours.  Invalid input(s): FREET3 No results for input(s): VITAMINB12, FOLATE, FERRITIN, TIBC, IRON, RETICCTPCT in the last 72 hours. No results for input(s): LIPASE, AMYLASE in the last 72 hours.  Urine Studies No results for input(s): UHGB, CRYS in the last 72 hours.  Invalid input(s): UACOL, UAPR, USPG, UPH, UTP, UGL, UKET, UBIL, UNIT, UROB, ULEU, UEPI, UWBC, URBC, UBAC, CAST, UCOM, BILUA  MICROBIOLOGY: Recent Results (from the past 240 hour(s))  MRSA PCR Screening     Status: None   Collection Time: 08/12/15  2:19 AM  Result Value Ref Range Status   MRSA by PCR NEGATIVE NEGATIVE Final    Comment:        The GeneXpert MRSA Assay (FDA approved for NASAL specimens only), is one component of a comprehensive MRSA colonization surveillance program. It is  not intended to diagnose MRSA infection nor to guide or monitor treatment for MRSA infections.   Culture, blood (routine x 2) Call MD if unable to obtain prior to antibiotics being given     Status: None (Preliminary result)   Collection Time: 08/12/15  3:00 AM  Result Value Ref Range Status   Specimen Description BLOOD LEFT HAND  Final   Special Requests BOTTLES DRAWN AEROBIC AND ANAEROBIC 5CC  Final   Culture NO GROWTH 4 DAYS  Final   Report Status PENDING  Incomplete  Culture, blood (routine x 2) Call MD if unable to obtain prior to antibiotics being given     Status:  None (Preliminary result)   Collection Time: 08/12/15  3:08 AM  Result Value Ref Range Status   Specimen Description BLOOD RIGHT HAND  Final   Special Requests IN PEDIATRIC BOTTLE 3CC  Final   Culture NO GROWTH 4 DAYS  Final   Report Status PENDING  Incomplete    RADIOLOGY STUDIES/RESULTS: Dg Chest 2 View  08/11/2015  CLINICAL DATA:  Shortness of breath. Altered mental status. Choked while eating this morning. Possible aspiration. EXAM: CHEST  2 VIEW COMPARISON:  07/04/2015. FINDINGS: Stable enlarged cardiac silhouette and post CABG changes. Interval mild patchy and linear density at the right lung base. Stable prominence of the interstitial markings. Mild increase in prominence of the pulmonary vasculature. Breathing motion blurring on the lateral view. IMPRESSION: 1. Atelectasis at the right lung base. Superimposed aspiration is unlikely based on the current appearance. 2. Stable cardiomegaly and mild chronic interstitial lung disease with interval mild pulmonary vascular congestion. Electronically Signed   By: Claudie Revering M.D.   On: 08/11/2015 17:07   Ct Head Wo Contrast  08/11/2015  CLINICAL DATA:  Initial encounter for 69 YOM walking using his walker and fell backwards and hit his head today 08/11/15. EXAM: CT HEAD WITHOUT CONTRAST CT CERVICAL SPINE WITHOUT CONTRAST TECHNIQUE: Multidetector CT imaging of the head  and cervical spine was performed following the standard protocol without intravenous contrast. Multiplanar CT image reconstructions of the cervical spine were also generated. COMPARISON:  Head CT of 05/26/2015. No prior cervical spine imaging. FINDINGS: CT HEAD FINDINGS Sinuses/Soft tissues: Mild left parietal scalp soft tissue thickening on image/series 67/4. Right maxillary sinus mucous retention cyst or polyp. No skull fracture. Clear mastoid air cells. Intracranial: Cerebral atrophy. Moderate low density in the periventricular white matter likely related to small vessel disease. Left cerebellar remote infarct. No mass lesion, hemorrhage, hydrocephalus, acute infarct, intra-axial, or extra-axial fluid collection. CT CERVICAL SPINE FINDINGS Spinal visualization through the bottom of T4. Prevertebral soft tissues are within normal limits. Dense carotid atherosclerosis bilaterally. No apical pneumothorax. Advanced spondylosis. Left worse than right neural foraminal narrowing at multiple levels. Mild central canal stenosis, most significant at C3-4. Prior median sternotomy. Maintenance of vertebral body height. Trace C3-4 retrolisthesis. From C7 inferiorly are mildly degraded secondary to patient body habitus. Facets are well-aligned. Coronal reformats demonstrate a normal C1-C2 articulation. IMPRESSION: 1. Mild left parietal scalp soft tissue swelling. No acute intracranial abnormality. 2. Advanced cervical spondylosis. Trace C3-4 is favored to be degenerative. Otherwise, no acute findings in the cervical spine identified. 3.  Cerebral atrophy and small vessel ischemic change. Electronically Signed   By: Abigail Miyamoto M.D.   On: 08/11/2015 16:50   Ct Angio Chest Pe W/cm &/or Wo Cm  08/11/2015  CLINICAL DATA:  Worsening dyspnea.  Altered mental status. EXAM: CT ANGIOGRAPHY CHEST WITH CONTRAST TECHNIQUE: Multidetector CT imaging of the chest was performed using the standard protocol during bolus administration of  intravenous contrast. Multiplanar CT image reconstructions and MIPs were obtained to evaluate the vascular anatomy. CONTRAST:  60mL OMNIPAQUE IOHEXOL 350 MG/ML SOLN COMPARISON:  08/28/2014 FINDINGS: Cardiovascular: There is good opacification of the pulmonary arteries. There is no pulmonary embolism. The thoracic aorta is normal in caliber and intact. There is extensive calcified coronary artery plaque. There are multiple coronary artery bypass grafts. Lungs: There are multifocal confluent airspace opacities, central predominant. Considerations include alveolar edema, infectious infiltrate. Hemorrhage or neoplasm less likely but not entirely excluded. Central airways: Patent Effusions: None Lymphadenopathy: Mildly prominent hilar and mediastinal nodes,  more likely reactive Esophagus: Unremarkable Upper abdomen: Unremarkable Musculoskeletal: No significant abnormality except for moderate thoracic degenerative disc disease, kyphosis, and prior sternotomy. Review of the MIP images confirms the above findings. IMPRESSION: 1. Negative for acute pulmonary embolism 2. Confluent airspace opacities bilaterally, right worse than left. Infectious infiltrate is a leading consideration. Alveolar edema less likely but not excluded. There also are mildly prominent hilar and mediastinal nodes which may be reactive. Electronically Signed   By: Andreas Newport M.D.   On: 08/11/2015 23:21   Ct Cervical Spine Wo Contrast  08/11/2015  CLINICAL DATA:  Initial encounter for 69 YOM walking using his walker and fell backwards and hit his head today 08/11/15. EXAM: CT HEAD WITHOUT CONTRAST CT CERVICAL SPINE WITHOUT CONTRAST TECHNIQUE: Multidetector CT imaging of the head and cervical spine was performed following the standard protocol without intravenous contrast. Multiplanar CT image reconstructions of the cervical spine were also generated. COMPARISON:  Head CT of 05/26/2015. No prior cervical spine imaging. FINDINGS: CT HEAD  FINDINGS Sinuses/Soft tissues: Mild left parietal scalp soft tissue thickening on image/series 67/4. Right maxillary sinus mucous retention cyst or polyp. No skull fracture. Clear mastoid air cells. Intracranial: Cerebral atrophy. Moderate low density in the periventricular white matter likely related to small vessel disease. Left cerebellar remote infarct. No mass lesion, hemorrhage, hydrocephalus, acute infarct, intra-axial, or extra-axial fluid collection. CT CERVICAL SPINE FINDINGS Spinal visualization through the bottom of T4. Prevertebral soft tissues are within normal limits. Dense carotid atherosclerosis bilaterally. No apical pneumothorax. Advanced spondylosis. Left worse than right neural foraminal narrowing at multiple levels. Mild central canal stenosis, most significant at C3-4. Prior median sternotomy. Maintenance of vertebral body height. Trace C3-4 retrolisthesis. From C7 inferiorly are mildly degraded secondary to patient body habitus. Facets are well-aligned. Coronal reformats demonstrate a normal C1-C2 articulation. IMPRESSION: 1. Mild left parietal scalp soft tissue swelling. No acute intracranial abnormality. 2. Advanced cervical spondylosis. Trace C3-4 is favored to be degenerative. Otherwise, no acute findings in the cervical spine identified. 3.  Cerebral atrophy and small vessel ischemic change. Electronically Signed   By: Abigail Miyamoto M.D.   On: 08/11/2015 16:50   Mr Brain Wo Contrast  08/12/2015  CLINICAL DATA:  Initial evaluation for syncope. EXAM: MRI HEAD WITHOUT CONTRAST TECHNIQUE: Multiplanar, multiecho pulse sequences of the brain and surrounding structures were obtained without intravenous contrast. COMPARISON:  Prior CT from on 08/11/2015. FINDINGS: Diffuse prominence of the CSF containing spaces is compatible with generalized age-related cerebral atrophy. Patchy T2/FLAIR hyperintensity within the periventricular and deep white matter both cerebral hemispheres most likely  related chronic small vessel ischemic disease. Encephalomalacia within the left cerebellar hemisphere consistent with remote infarct. Associated chronic hemosiderin staining present within this region. Probable remote infarct within the anteromedial left frontal lobe as well. There is a possible tiny punctate 4 mm focus of high signal intensity on DWI sequence involving the cortical gray matter of the posterior right frontal region (series 3, image 31 on axial sequence, series 5, image 14 on coronal sequence). Finding may reflect a tiny acute ischemic infarct. Corresponding signal loss not definitely seen on ADC map, although this is felt to be too small to be resolved. No other definite acute intracranial infarct. Abnormal flow void within the distal left vertebral artery, which may related to focal plaque or possibly chronic dissection (series 8, image 3), similar to prior. Major intracranial vascular flow voids otherwise maintained. The right vertebral artery is diminutive. No acute intracranial hemorrhage. No mass lesion, midline  shift, or mass effect. No hydrocephalus. No extra-axial fluid collection. Craniocervical junction grossly within normal limits. Degenerative spondylolysis noted within the partially visualized upper cervical spine. Pituitary gland grossly normal. No acute abnormality about the orbits. Sequela prior lens extraction present on the left. Mild mucosal thickening within the maxillary sinuses and ethmoidal air cells. Paranasal sinuses are otherwise clear. Minimal opacity within the right mastoid air cells. Inner ear structures normal. Bone marrow signal intensity within normal limits. No scalp soft tissue abnormality. IMPRESSION: 1. Question 4 mm punctate focus of high signal intensity on DWI sequence within the posterior right frontal lobe. Finding favored to reflect artifact, although possible tiny acute ischemic infarct is not entirely excluded, and could be considered in the correct  clinical setting. 2. No other acute intracranial process. 3. 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 4. Abnormal flow void within the distal left vertebral artery, which may be related to focal plaque or possibly chronic dissection. This is similar relative to prior study. 5. Age-related cerebral atrophy with mild to moderate chronic small vessel ischemic disease. Electronically Signed   By: Jeannine Boga M.D.   On: 08/12/2015 02:40   US Renal  08/14/2015  CLINICAL DATA:  69 year old male with acute renal failure EXAM: RENAL / URINARY TRACT ULTRASOUND COMPLETE COMPARISON:  CT scan of the abdomen and pelvis 07/01/2015 FINDINGS: Right Kidney: Length: 12.2 cm. Technically challenging examination secondary to patient body habitus and diffuse edema. The right kidney is very poorly seen. No obvious hydronephrosis. Left Kidney: Length: 12.3 cm. Poorly seen. No hydronephrosis. 4.3 cm anechoic simple cyst in the interpolar region. Bladder: Not visualized. IMPRESSION: 1. Technically limited study secondary to patient body habitus and diffuse soft tissue edema. 2. The right kidney is very poorly seen.  No obvious hydronephrosis. 3. No left-sided hydronephrosis. 4. Simple renal cysts on the left. Electronically Signed   By: Jacqulynn Cadet M.D.   On: 08/14/2015 21:25   Dg Chest Port 1 View  08/15/2015  CLINICAL DATA:  Short of breath, COPD, asthma EXAM: PORTABLE CHEST 1 VIEW COMPARISON:  Radiograph 08/12/2015 FINDINGS: Sternotomy wires overlie normal cardiac silhouette. Linear opacity at the RIGHT lung base again demonstrated slightly improved. Patchy perihilar airspace disease again demonstrated. No pneumothorax. IMPRESSION: 1. Slight improvement in density of RIGHT lower lobe atelectasis versus infiltrate. 2. No change in perihilar opacities representing mild edema or infection. Electronically Signed   By: Suzy Bouchard M.D.   On: 08/15/2015 08:02   Dg Abd  Acute W/chest  08/12/2015  CLINICAL DATA:  Episode of nausea and vomiting earlier, no abdominal pain, history of bowel resection and breast malignancy. EXAM: DG ABDOMEN ACUTE W/ 1V CHEST COMPARISON:  Portable chest x-ray of August 11, 2015 and chest CT scan of the same day FINDINGS: The lungs are borderline hypoinflated. Confluent alveolar opacities are present at both lung bases greater on the right than on the left. A trace of pleural fluid on the right is suspected. The cardiac silhouette is mildly enlarged. The central pulmonary vascularity is engorged and indistinct. The patient has undergone previous CABG. The stool burden within the colon is mildly increased. There is a moderate amount of gas within loops of the sigmoid colon. There is no small or large bowel obstructive pattern and no evidence of perforation. There is degenerative change of the lower lumbar spine. There is contrast within the urinary bladder from the earlier CT scan. There is splenic arterial calcification. IMPRESSION: 1. Patchy airspace opacities bilaterally  consistent with pneumonia. A small amount of pleural fluid is likely present on the right. 2. CHF with mild pulmonary vascular congestion. 3. No acute intra-abdominal abnormality is observed. Increased colonic stool burden may reflect constipation in the appropriate clinical setting. Electronically Signed   By: David  Martinique M.D.   On: 08/12/2015 08:16    Oren Binet, MD  [facility] Pager:[phone]  If 7PM-7AM, please contact night-coverage [email or web address] Password [email or web address] 08/16/2015, 11:33 AM   LOS: 5 days

## 2015-08-17 ENCOUNTER — Inpatient Hospital Stay (HOSPITAL_COMMUNITY): Payer: Medicare Other

## 2015-08-17 DIAGNOSIS — N178 Other acute kidney failure: Secondary | ICD-10-CM

## 2015-08-17 DIAGNOSIS — R1011 Right upper quadrant pain: Secondary | ICD-10-CM

## 2015-08-17 LAB — BASIC METABOLIC PANEL
ANION GAP: 10 (ref 5–15)
BUN: 40 mg/dL — AB (ref 6–20)
CHLORIDE: 97 mmol/L — AB (ref 101–111)
CO2: 31 mmol/L (ref 22–32)
Calcium: 9.2 mg/dL (ref 8.9–10.3)
Creatinine, Ser: 2.07 mg/dL — ABNORMAL HIGH (ref 0.61–1.24)
GFR calc Af Amer: 35 mL/min — ABNORMAL LOW (ref >60)
GFR, EST NON AFRICAN AMERICAN: 31 mL/min — AB (ref >60)
GLUCOSE: 177 mg/dL — AB (ref 65–99)
POTASSIUM: 3.5 mmol/L (ref 3.5–5.1)
Sodium: 138 mmol/L (ref 135–145)

## 2015-08-17 LAB — AMMONIA: AMMONIA: 41 umol/L — AB (ref 9–35)

## 2015-08-17 LAB — CBC
HEMATOCRIT: 33.6 % — AB (ref 39.0–52.0)
Hemoglobin: 10 g/dL — ABNORMAL LOW (ref 13.0–17.0)
MCH: 29.3 pg (ref 26.0–34.0)
MCHC: 29.8 g/dL — ABNORMAL LOW (ref 30.0–36.0)
MCV: 98.5 fL (ref 78.0–100.0)
PLATELETS: 124 10*3/uL — AB (ref 150–400)
RBC: 3.41 MIL/uL — ABNORMAL LOW (ref 4.22–5.81)
RDW: 15.5 % (ref 11.5–15.5)
WBC: 4.1 10*3/uL (ref 4.0–10.5)

## 2015-08-17 LAB — GLUCOSE, CAPILLARY
GLUCOSE-CAPILLARY: 157 mg/dL — AB (ref 65–99)
GLUCOSE-CAPILLARY: 150 mg/dL — AB (ref 65–99)
GLUCOSE-CAPILLARY: 195 mg/dL — AB (ref 65–99)
Glucose-Capillary: 144 mg/dL — ABNORMAL HIGH (ref 65–99)

## 2015-08-17 LAB — COMPREHENSIVE METABOLIC PANEL
ALT: 9 U/L — ABNORMAL LOW (ref 17–63)
AST: 15 U/L (ref 15–41)
Albumin: 2.7 g/dL — ABNORMAL LOW (ref 3.5–5.0)
Alkaline Phosphatase: 46 U/L (ref 38–126)
Anion gap: 11 (ref 5–15)
BUN: 37 mg/dL — AB (ref 6–20)
CHLORIDE: 99 mmol/L — AB (ref 101–111)
CO2: 30 mmol/L (ref 22–32)
Calcium: 9.2 mg/dL (ref 8.9–10.3)
Creatinine, Ser: 1.94 mg/dL — ABNORMAL HIGH (ref 0.61–1.24)
GFR calc Af Amer: 38 mL/min — ABNORMAL LOW (ref >60)
GFR, EST NON AFRICAN AMERICAN: 33 mL/min — AB (ref >60)
Glucose, Bld: 173 mg/dL — ABNORMAL HIGH (ref 65–99)
POTASSIUM: 3.7 mmol/L (ref 3.5–5.1)
SODIUM: 140 mmol/L (ref 135–145)
Total Bilirubin: 0.8 mg/dL (ref 0.3–1.2)
Total Protein: 6.8 g/dL (ref 6.5–8.1)

## 2015-08-17 LAB — POCT I-STAT 3, VENOUS BLOOD GAS (G3P V)
Acid-Base Excess: 4 mmol/L — ABNORMAL HIGH (ref 0.0–2.0)
Bicarbonate: 31.5 mEq/L — ABNORMAL HIGH (ref 20.0–24.0)
O2 SAT: 42 %
PO2 VEN: 26 mmHg — AB (ref 30.0–45.0)
TCO2: 33 mmol/L (ref 0–100)
pCO2, Ven: 58.8 mmHg — ABNORMAL HIGH (ref 45.0–50.0)
pH, Ven: 7.337 — ABNORMAL HIGH (ref 7.250–7.300)

## 2015-08-17 LAB — CULTURE, BLOOD (ROUTINE X 2)
CULTURE: NO GROWTH
Culture: NO GROWTH

## 2015-08-17 MED ORDER — HYDRALAZINE HCL 50 MG PO TABS
50.0000 mg | ORAL_TABLET | Freq: Once | ORAL | Status: AC
  Administered 2015-08-17: 50 mg via ORAL
  Filled 2015-08-17: qty 1

## 2015-08-17 MED ORDER — POTASSIUM CHLORIDE CRYS ER 20 MEQ PO TBCR
40.0000 meq | EXTENDED_RELEASE_TABLET | Freq: Two times a day (BID) | ORAL | Status: DC
Start: 2015-08-17 — End: 2015-08-21
  Administered 2015-08-17 – 2015-08-21 (×9): 40 meq via ORAL
  Filled 2015-08-17 (×9): qty 2

## 2015-08-17 MED ORDER — PIPERACILLIN-TAZOBACTAM 3.375 G IVPB
3.3750 g | Freq: Three times a day (TID) | INTRAVENOUS | Status: AC
  Administered 2015-08-17 – 2015-08-18 (×3): 3.375 g via INTRAVENOUS
  Filled 2015-08-17 (×3): qty 50

## 2015-08-17 MED ORDER — HYDRALAZINE HCL 50 MG PO TABS
75.0000 mg | ORAL_TABLET | Freq: Three times a day (TID) | ORAL | Status: DC
  Administered 2015-08-17 – 2015-08-19 (×6): 75 mg via ORAL
  Filled 2015-08-17 (×14): qty 1

## 2015-08-17 NOTE — Progress Notes (Addendum)
SUBJECTIVE:   Apparently was up all night. Got oxycodone. Very lethargic this am. Diuresing well on acetazolamide. Weight down 17 pounds     Intake/Output Summary (Last 24 hours) at 08/17/15 1607 Last data filed at 08/17/15 0400  Gross per 24 hour  Intake    610 ml  Output   3475 ml  Net  -2865 ml    Current Facility-Administered Medications  Medication Dose Route Frequency Provider Last Rate Last Dose  . 0.9 %  sodium chloride infusion  250 mL Intravenous PRN Jettie Booze, MD 10 mL/hr at 08/16/15 1900 250 mL at 08/16/15 1900  . 0.9 %  sodium chloride infusion  250 mL Intravenous PRN Jettie Booze, MD 10 mL/hr at 08/16/15 2000 250 mL at 08/16/15 2000  . acetaminophen (TYLENOL) tablet 650 mg  650 mg Oral Q4H PRN Jettie Booze, MD   650 mg at 08/17/15 0010  . acetaZOLAMIDE (DIAMOX) tablet 500 mg  500 mg Oral BID Skeet Latch, MD   500 mg at 08/16/15 2125  . albuterol (PROVENTIL) (2.5 MG/3ML) 0.083% nebulizer solution 2.5 mg  2.5 mg Nebulization Q4H PRN Jonetta Osgood, MD      . allopurinol (ZYLOPRIM) tablet 300 mg  300 mg Oral Daily Rise Patience, MD   300 mg at 08/16/15 1050  . aspirin EC tablet 325 mg  325 mg Oral Daily Rise Patience, MD   325 mg at 08/16/15 1051  . atorvastatin (LIPITOR) tablet 20 mg  20 mg Oral Daily Rise Patience, MD   20 mg at 08/16/15 1050  . budesonide (PULMICORT) nebulizer solution 0.25 mg  0.25 mg Nebulization BID Rise Patience, MD   0.25 mg at 08/16/15 2056  . carvedilol (COREG) tablet 6.25 mg  6.25 mg Oral BID WC Rise Patience, MD   6.25 mg at 08/16/15 1815  . colchicine tablet 0.6 mg  0.6 mg Oral Daily Rise Patience, MD   0.6 mg at 08/16/15 1051  . enoxaparin (LOVENOX) injection 40 mg  40 mg Subcutaneous Q24H Rise Patience, MD   40 mg at 08/16/15 1051  . FLUoxetine (PROZAC) capsule 20 mg  20 mg Oral Daily Rise Patience, MD   20 mg at 08/16/15 1051  . folic acid (FOLVITE)  tablet 1 mg  1 mg Oral Daily Rise Patience, MD   1 mg at 08/16/15 1051  . furosemide (LASIX) injection 80 mg  80 mg Intravenous BID Jonetta Osgood, MD   80 mg at 08/16/15 1815  . gabapentin (NEURONTIN) capsule 300 mg  300 mg Oral BID Rise Patience, MD   300 mg at 08/16/15 2126  . hydrALAZINE (APRESOLINE) tablet 50 mg  50 mg Oral 3 times per day Herminio Commons, MD   50 mg at 08/17/15 0646  . insulin aspart (novoLOG) injection 0-9 Units  0-9 Units Subcutaneous TID WC Rise Patience, MD   3 Units at 08/16/15 1700  . ipratropium-albuterol (DUONEB) 0.5-2.5 (3) MG/3ML nebulizer solution 3 mL  3 mL Nebulization TID Jonetta Osgood, MD   3 mL at 08/16/15 2056  . isosorbide dinitrate (ISORDIL) tablet 10 mg  10 mg Oral TID Skeet Latch, MD   10 mg at 08/16/15 2125  . levothyroxine (SYNTHROID, LEVOTHROID) tablet 187 mcg  187 mcg Oral QAC breakfast Rise Patience, MD   187 mcg at 08/17/15 0645  . magnesium oxide (MAG-OX) tablet 400 mg  400 mg Oral BID Rise Patience, MD   400 mg at 08/16/15 2125  . ondansetron (ZOFRAN) injection 4 mg  4 mg Intravenous Q6H PRN Jettie Booze, MD      . oxyCODONE (Oxy IR/ROXICODONE) immediate release tablet 10 mg  10 mg Oral Q8H PRN Rise Patience, MD   10 mg at 08/17/15 0353  . pantoprazole (PROTONIX) EC tablet 40 mg  40 mg Oral Daily Rise Patience, MD   40 mg at 08/16/15 1051  . piperacillin-tazobactam (ZOSYN) IVPB 3.375 g  3.375 g Intravenous 3 times per day Erenest Blank, RPH   3.375 g at 08/17/15 0644  . potassium chloride SA (K-DUR,KLOR-CON) CR tablet 40 mEq  40 mEq Oral BID Jonetta Osgood, MD      . senna-docusate (Senokot-S) tablet 1 tablet  1 tablet Oral QHS PRN Rise Patience, MD      . sodium chloride 0.9 % injection 3 mL  3 mL Intravenous Q12H Jettie Booze, MD   3 mL at 08/16/15 2128  . sodium chloride 0.9 % injection 3 mL  3 mL Intravenous PRN Jettie Booze, MD      . sodium chloride 0.9  % injection 3 mL  3 mL Intravenous Q12H Jettie Booze, MD   3 mL at 08/16/15 2128  . sodium chloride 0.9 % injection 3 mL  3 mL Intravenous PRN Jettie Booze, MD      . tamoxifen (NOLVADEX) tablet 20 mg  20 mg Oral QHS Rise Patience, MD   20 mg at 08/16/15 2125  . thiamine (VITAMIN B-1) tablet 100 mg  100 mg Oral Daily Corky Sox, MD   100 mg at 08/16/15 1051  . venlafaxine XR (EFFEXOR-XR) 24 hr capsule 37.5 mg  37.5 mg Oral Daily Rise Patience, MD   37.5 mg at 08/16/15 1050    Filed Vitals:   08/17/15 0200 08/17/15 0300 08/17/15 0400 08/17/15 0646  BP:   145/90 163/101  Pulse: 88 86 80   Temp:   98 F (36.7 C)   TempSrc:      Resp: 17 25 18    Weight:   108.818 kg (239 lb 14.4 oz)   SpO2: 95% 97% 97%     PHYSICAL EXAM General: NAD lEthargic HEENT: Normal. Neck: JVD to angle of jaw.  Lungs: No rales. CV: Irregular rhythm, normal rate, normal S1/S2, no S3, no murmur.  Legs wrapped in ACE bandages, mild edema Abdomen: Soft, obese. Very tender in RUQ no rebound Neurologic: Alert.  Psych: Normal affect.  TELEMETRY: Reviewed telemetry pt in atrial fibrillation.  LABS: Basic Metabolic Panel:  Recent Labs  08/16/15 0245 08/17/15 0145  NA 138 138  K 4.6 3.5  CL 98* 97*  CO2 30 31  GLUCOSE 147* 177*  BUN 46* 40*  CREATININE 2.15* 2.07*  CALCIUM 9.3 9.2   Liver Function Tests: No results for input(s): AST, ALT, ALKPHOS, BILITOT, PROT, ALBUMIN in the last 72 hours. No results for input(s): LIPASE, AMYLASE in the last 72 hours. CBC:  Recent Labs  08/15/15 0317 08/17/15 0145  WBC 5.5 4.1  HGB 10.8* 10.0*  HCT 35.4* 33.6*  MCV 99.7 98.5  PLT 101* 124*   Cardiac Enzymes: No results for input(s): CKTOTAL, CKMB, CKMBINDEX, TROPONINI in the last 72 hours. BNP: Invalid input(s): POCBNP D-Dimer: No results for input(s): DDIMER in the last 72 hours. Hemoglobin A1C: No results for input(s): HGBA1C in the last 72  hours. Fasting Lipid Panel: No  results for input(s): CHOL, HDL, LDLCALC, TRIG, CHOLHDL, LDLDIRECT in the last 72 hours. Thyroid Function Tests: No results for input(s): TSH, T4TOTAL, T3FREE, THYROIDAB in the last 72 hours.  Invalid input(s): FREET3 Anemia Panel: No results for input(s): VITAMINB12, FOLATE, FERRITIN, TIBC, IRON, RETICCTPCT in the last 72 hours.  RADIOLOGY: Dg Chest 2 View  08/11/2015  CLINICAL DATA:  Shortness of breath. Altered mental status. Choked while eating this morning. Possible aspiration. EXAM: CHEST  2 VIEW COMPARISON:  07/04/2015. FINDINGS: Stable enlarged cardiac silhouette and post CABG changes. Interval mild patchy and linear density at the right lung base. Stable prominence of the interstitial markings. Mild increase in prominence of the pulmonary vasculature. Breathing motion blurring on the lateral view. IMPRESSION: 1. Atelectasis at the right lung base. Superimposed aspiration is unlikely based on the current appearance. 2. Stable cardiomegaly and mild chronic interstitial lung disease with interval mild pulmonary vascular congestion. Electronically Signed   By: Claudie Revering M.D.   On: 08/11/2015 17:07   Ct Head Wo Contrast  08/11/2015  CLINICAL DATA:  Initial encounter for 69 YOM walking using his walker and fell backwards and hit his head today 08/11/15. EXAM: CT HEAD WITHOUT CONTRAST CT CERVICAL SPINE WITHOUT CONTRAST TECHNIQUE: Multidetector CT imaging of the head and cervical spine was performed following the standard protocol without intravenous contrast. Multiplanar CT image reconstructions of the cervical spine were also generated. COMPARISON:  Head CT of 05/26/2015. No prior cervical spine imaging. FINDINGS: CT HEAD FINDINGS Sinuses/Soft tissues: Mild left parietal scalp soft tissue thickening on image/series 67/4. Right maxillary sinus mucous retention cyst or polyp. No skull fracture. Clear mastoid air cells. Intracranial: Cerebral atrophy. Moderate low density in the periventricular  white matter likely related to small vessel disease. Left cerebellar remote infarct. No mass lesion, hemorrhage, hydrocephalus, acute infarct, intra-axial, or extra-axial fluid collection. CT CERVICAL SPINE FINDINGS Spinal visualization through the bottom of T4. Prevertebral soft tissues are within normal limits. Dense carotid atherosclerosis bilaterally. No apical pneumothorax. Advanced spondylosis. Left worse than right neural foraminal narrowing at multiple levels. Mild central canal stenosis, most significant at C3-4. Prior median sternotomy. Maintenance of vertebral body height. Trace C3-4 retrolisthesis. From C7 inferiorly are mildly degraded secondary to patient body habitus. Facets are well-aligned. Coronal reformats demonstrate a normal C1-C2 articulation. IMPRESSION: 1. Mild left parietal scalp soft tissue swelling. No acute intracranial abnormality. 2. Advanced cervical spondylosis. Trace C3-4 is favored to be degenerative. Otherwise, no acute findings in the cervical spine identified. 3.  Cerebral atrophy and small vessel ischemic change. Electronically Signed   By: Abigail Miyamoto M.D.   On: 08/11/2015 16:50   Ct Angio Chest Pe W/cm &/or Wo Cm  08/11/2015  CLINICAL DATA:  Worsening dyspnea.  Altered mental status. EXAM: CT ANGIOGRAPHY CHEST WITH CONTRAST TECHNIQUE: Multidetector CT imaging of the chest was performed using the standard protocol during bolus administration of intravenous contrast. Multiplanar CT image reconstructions and MIPs were obtained to evaluate the vascular anatomy. CONTRAST:  67mL OMNIPAQUE IOHEXOL 350 MG/ML SOLN COMPARISON:  08/28/2014 FINDINGS: Cardiovascular: There is good opacification of the pulmonary arteries. There is no pulmonary embolism. The thoracic aorta is normal in caliber and intact. There is extensive calcified coronary artery plaque. There are multiple coronary artery bypass grafts. Lungs: There are multifocal confluent airspace opacities, central predominant.  Considerations include alveolar edema, infectious infiltrate. Hemorrhage or neoplasm less likely but not entirely excluded. Central airways: Patent Effusions: None Lymphadenopathy: Mildly prominent hilar and  mediastinal nodes, more likely reactive Esophagus: Unremarkable Upper abdomen: Unremarkable Musculoskeletal: No significant abnormality except for moderate thoracic degenerative disc disease, kyphosis, and prior sternotomy. Review of the MIP images confirms the above findings. IMPRESSION: 1. Negative for acute pulmonary embolism 2. Confluent airspace opacities bilaterally, right worse than left. Infectious infiltrate is a leading consideration. Alveolar edema less likely but not excluded. There also are mildly prominent hilar and mediastinal nodes which may be reactive. Electronically Signed   By: Andreas Newport M.D.   On: 08/11/2015 23:21   Ct Cervical Spine Wo Contrast  08/11/2015  CLINICAL DATA:  Initial encounter for 69 YOM walking using his walker and fell backwards and hit his head today 08/11/15. EXAM: CT HEAD WITHOUT CONTRAST CT CERVICAL SPINE WITHOUT CONTRAST TECHNIQUE: Multidetector CT imaging of the head and cervical spine was performed following the standard protocol without intravenous contrast. Multiplanar CT image reconstructions of the cervical spine were also generated. COMPARISON:  Head CT of 05/26/2015. No prior cervical spine imaging. FINDINGS: CT HEAD FINDINGS Sinuses/Soft tissues: Mild left parietal scalp soft tissue thickening on image/series 67/4. Right maxillary sinus mucous retention cyst or polyp. No skull fracture. Clear mastoid air cells. Intracranial: Cerebral atrophy. Moderate low density in the periventricular white matter likely related to small vessel disease. Left cerebellar remote infarct. No mass lesion, hemorrhage, hydrocephalus, acute infarct, intra-axial, or extra-axial fluid collection. CT CERVICAL SPINE FINDINGS Spinal visualization through the bottom of T4.  Prevertebral soft tissues are within normal limits. Dense carotid atherosclerosis bilaterally. No apical pneumothorax. Advanced spondylosis. Left worse than right neural foraminal narrowing at multiple levels. Mild central canal stenosis, most significant at C3-4. Prior median sternotomy. Maintenance of vertebral body height. Trace C3-4 retrolisthesis. From C7 inferiorly are mildly degraded secondary to patient body habitus. Facets are well-aligned. Coronal reformats demonstrate a normal C1-C2 articulation. IMPRESSION: 1. Mild left parietal scalp soft tissue swelling. No acute intracranial abnormality. 2. Advanced cervical spondylosis. Trace C3-4 is favored to be degenerative. Otherwise, no acute findings in the cervical spine identified. 3.  Cerebral atrophy and small vessel ischemic change. Electronically Signed   By: Abigail Miyamoto M.D.   On: 08/11/2015 16:50   Mr Brain Wo Contrast  08/12/2015  CLINICAL DATA:  Initial evaluation for syncope. EXAM: MRI HEAD WITHOUT CONTRAST TECHNIQUE: Multiplanar, multiecho pulse sequences of the brain and surrounding structures were obtained without intravenous contrast. COMPARISON:  Prior CT from on 08/11/2015. FINDINGS: Diffuse prominence of the CSF containing spaces is compatible with generalized age-related cerebral atrophy. Patchy T2/FLAIR hyperintensity within the periventricular and deep white matter both cerebral hemispheres most likely related chronic small vessel ischemic disease. Encephalomalacia within the left cerebellar hemisphere consistent with remote infarct. Associated chronic hemosiderin staining present within this region. Probable remote infarct within the anteromedial left frontal lobe as well. There is a possible tiny punctate 4 mm focus of high signal intensity on DWI sequence involving the cortical gray matter of the posterior right frontal region (series 3, image 31 on axial sequence, series 5, image 14 on coronal sequence). Finding may reflect a tiny  acute ischemic infarct. Corresponding signal loss not definitely seen on ADC map, although this is felt to be too small to be resolved. No other definite acute intracranial infarct. Abnormal flow void within the distal left vertebral artery, which may related to focal plaque or possibly chronic dissection (series 8, image 3), similar to prior. Major intracranial vascular flow voids otherwise maintained. The right vertebral artery is diminutive. No acute intracranial hemorrhage. No mass  lesion, midline shift, or mass effect. No hydrocephalus. No extra-axial fluid collection. Craniocervical junction grossly within normal limits. Degenerative spondylolysis noted within the partially visualized upper cervical spine. Pituitary gland grossly normal. No acute abnormality about the orbits. Sequela prior lens extraction present on the left. Mild mucosal thickening within the maxillary sinuses and ethmoidal air cells. Paranasal sinuses are otherwise clear. Minimal opacity within the right mastoid air cells. Inner ear structures normal. Bone marrow signal intensity within normal limits. No scalp soft tissue abnormality. IMPRESSION: 1. Question 4 mm punctate focus of high signal intensity on DWI sequence within the posterior right frontal lobe. Finding favored to reflect artifact, although possible tiny acute ischemic infarct is not entirely excluded, and could be considered in the correct clinical setting. 2. No other acute intracranial process. 3. 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 4. Abnormal flow void within the distal left vertebral artery, which may be related to focal plaque or possibly chronic dissection. This is similar relative to prior study. 5. Age-related cerebral atrophy with mild to moderate chronic small vessel ischemic disease. Electronically Signed   By: Jeannine Boga M.D.   On: 08/12/2015 02:40   US Renal  08/14/2015  CLINICAL DATA:  69 year old  male with acute renal failure EXAM: RENAL / URINARY TRACT ULTRASOUND COMPLETE COMPARISON:  CT scan of the abdomen and pelvis 07/01/2015 FINDINGS: Right Kidney: Length: 12.2 cm. Technically challenging examination secondary to patient body habitus and diffuse edema. The right kidney is very poorly seen. No obvious hydronephrosis. Left Kidney: Length: 12.3 cm. Poorly seen. No hydronephrosis. 4.3 cm anechoic simple cyst in the interpolar region. Bladder: Not visualized. IMPRESSION: 1. Technically limited study secondary to patient body habitus and diffuse soft tissue edema. 2. The right kidney is very poorly seen.  No obvious hydronephrosis. 3. No left-sided hydronephrosis. 4. Simple renal cysts on the left. Electronically Signed   By: Jacqulynn Cadet M.D.   On: 08/14/2015 21:25   Dg Chest Port 1 View  08/15/2015  CLINICAL DATA:  Short of breath, COPD, asthma EXAM: PORTABLE CHEST 1 VIEW COMPARISON:  Radiograph 08/12/2015 FINDINGS: Sternotomy wires overlie normal cardiac silhouette. Linear opacity at the RIGHT lung base again demonstrated slightly improved. Patchy perihilar airspace disease again demonstrated. No pneumothorax. IMPRESSION: 1. Slight improvement in density of RIGHT lower lobe atelectasis versus infiltrate. 2. No change in perihilar opacities representing mild edema or infection. Electronically Signed   By: Suzy Bouchard M.D.   On: 08/15/2015 08:02   Dg Abd Acute W/chest  08/12/2015  CLINICAL DATA:  Episode of nausea and vomiting earlier, no abdominal pain, history of bowel resection and breast malignancy. EXAM: DG ABDOMEN ACUTE W/ 1V CHEST COMPARISON:  Portable chest x-ray of August 11, 2015 and chest CT scan of the same day FINDINGS: The lungs are borderline hypoinflated. Confluent alveolar opacities are present at both lung bases greater on the right than on the left. A trace of pleural fluid on the right is suspected. The cardiac silhouette is mildly enlarged. The central pulmonary  vascularity is engorged and indistinct. The patient has undergone previous CABG. The stool burden within the colon is mildly increased. There is a moderate amount of gas within loops of the sigmoid colon. There is no small or large bowel obstructive pattern and no evidence of perforation. There is degenerative change of the lower lumbar spine. There is contrast within the urinary bladder from the earlier CT scan. There is splenic arterial calcification. IMPRESSION: 1. Patchy airspace  opacities bilaterally consistent with pneumonia. A small amount of pleural fluid is likely present on the right. 2. CHF with mild pulmonary vascular congestion. 3. No acute intra-abdominal abnormality is observed. Increased colonic stool burden may reflect constipation in the appropriate clinical setting. Electronically Signed   By: David  Martinique M.D.   On: 08/12/2015 08:16      ASSESSMENT AND PLAN: 1. Acute on chronic diastolic heart failure:  --He has significant RV failure on echo. Has responded well to IV lasix and acetazolamide. Weight down below previous baseline. Creatinine improved. Will continue 1 more day.   2. Chronic atrial fibrillation: Rate controlled on Coreg. Per clinic notes, no anticoagulation due to alcohol abuse and h/o falls. Continue high dose ASA.   3. 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 Symptomatically stable from this standpoint. Continue ASA, statin and BB.   4. PNA: On Zosyn.  5. Essential HTN: BP still elevated. Will increase hydralazine to 75 mg tid.  6. Confusion: may be from sundowning and oxycodone. Will repeat ammonia level  7. RUQ pain: will check LFTs and ab CT (no iv contrast due to CKD)  [name] Fuerte,MD 8:24 AM

## 2015-08-17 NOTE — Progress Notes (Signed)
Pt refusing CPAP at this time.

## 2015-08-17 NOTE — Progress Notes (Signed)
PATIENT DETAILS Name: [name] Age: 69 y.o. Sex: male Date of Birth: 1946/04/27 Admit Date: 08/11/2015 Admitting Physician Rise Patience, MD VOH:[id number],GGYIR, MD  Brief narrative:  69 year old Caucasian male with history of chronic diastolic heart failure, prior history of alcohol abuse, chronic kidney disease stage III, diabetes, CAD status post CABG-admitted for evaluation of syncopal episode after vomiting. Found to have suspected aspiration pneumonia, acute diastolic heart failure causing acute hypoxic respiratory failure. Hospital course complicated by development of worsening renal failure. Cardiology performed [place] on 11/4 which showed severe pulmonary hypertension. See below for further details   Subjective: Less short of breath, confused-essentially unchanged mental status  Assessment/Plan: Principal Problem: Acute respiratory failure with hypoxia: Suspect multifactorial-from probable aspiration pneumonia,  Acute diastolic heart failure, underlying pulmonary hypertension. Slowly improving with empiric Zosyn and Lasix/Diamox. Titrate off oxygen as tolerated-follow  Active Problems: ?Syncope/Pre syncope: In a setting of vomiting-? Vagal. Telemetry-shows A. fib-but no other arrhythmias. Recent echocardiogram on 06/12/15 shows EF around 60-65%. EEG negative for seizure-like activity. Continue to monitor in telemetry. Cardiology following   Probable aspiration pneumonia: Improving, remains afebrile and without leukocytosis. Continue empiric Zosyn-plan on total 7 days of treatment (stop date 11/7). Blood cultures negative so far, urine streptococcal  and  Legionella antigen negative  Acute on chronic diastolic heart failure/right sided heart failure: Dry weight appears to be around 244 pounds-significant diuresis with IV Lasix and Diamox-weight now decreased to 239 pounds, still appears to be volume overloaded somewhat-recommendations are to continue with  current diuretic regimen and reassess tomorrow morning.   Acute on CKD stage 3: Acute renal failure likely multifactorial in a setting of acute illness/prerenal azotemia/diuretics/possible contrast-induced nephropathy. Suspect CKD likely secondary to underlying diabetic nephropathy. Creatinine now down trending, continue with current dosing of diuretics. Note renal ultrasound negative for hydronephrosis.   Acute encephalopathy:Remains mildly confused- suspect intermittent sundowning/delirium in the setting of undiagnosed mild dementia/cognitive dysfunction.Per patient and family last use of EtOH was at least 2-4 months back-therefore do not think has alcohol withdrawal.  Acute CVA: Suspect more of an incidental finding. Has a history of atrial fibrillation-considered a poor candidate for long-term anticoagulation given history of alcohol use and thrombocytopenia.CHA2DS2-VASc score 6.A1c 6.8, LDL 65. Carotid Doppler without significant stenosis.Continue with aspirin and statin.  ? Abdominal pain: Mild tenderness in the right mid abdominal area-without any peritoneal signs. Await CT abdomen.  Anemia/thrombocytopenia: Chronic issue, stable for outpatient work up  Minimally elevated troponins: May be demand ischemia or false positive elevation in the setting of chronic kidney disease. No workup required, continue with aspirin, statin and beta blockers  Atrial fibrillation: See above regarding anticoagulation-rate controlled with Coreg.  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 Chronic small ulceration in the anterior abdominal wall that appears stable  History of right breast cancer: Status post mastectomy, continue tamoxifen.Follow with Dr Lindi Adie  Essential hypertension:controlled, continue Coreg, hydralazine and Imdur. Follow BP   Type 2 diabetes: CBGs stable-with SSI.  A1c 6.8. Previously was  on oral agents that was discontinued in the past because of hypoglycemia-will need to be reassessed on discharge   Hypothyroidism: Continue with Synthroid  COPD: Continue with bronchodilators-this appears stable  Gout: Continue colchicine-appears stable  CAD status post CABG: Continue aspirin, statins and beta blockers  OSA: Related to obesity-continue CPAP  Deconditioning: Suspect underlying failure to thrive syndrome-Will  likely require SNF on discharge   Disposition: Remain suspect SNF in the next 1-2 days   Antimicrobial agents  See below  Anti-infectives    Start     Dose/Rate Route Frequency Ordered Stop   08/12/15 1000  vancomycin (VANCOCIN) IVPB 750 mg/150 ml premix  Status:  Discontinued     750 mg 150 mL/hr over 60 Minutes Intravenous Every 12 hours 08/12/15 0215 08/14/15 0906   08/12/15 0230  vancomycin (VANCOCIN) 1,250 mg in sodium chloride 0.9 % 250 mL IVPB     1,250 mg 166.7 mL/hr over 90 Minutes Intravenous  Once 08/12/15 0215 08/12/15 0542   08/12/15 0230  piperacillin-tazobactam (ZOSYN) IVPB 3.375 g     3.375 g 12.5 mL/hr over 240 Minutes Intravenous 3 times per day 08/12/15 0215        DVT Prophylaxis: Prophylactic Lovenox   Code Status: Full code   Family Communication Daughter over the phone  Procedures: None  CONSULTS:  cardiology and neurology  Time spent 30 minutes-Greater than 50% of this time was spent in counseling, explanation of diagnosis, planning of further management, and coordination of care.  MEDICATIONS: Scheduled Meds: . acetaZOLAMIDE  500 mg Oral BID  . allopurinol  300 mg Oral Daily  . aspirin EC  325 mg Oral Daily  . atorvastatin  20 mg Oral Daily  . budesonide (PULMICORT) nebulizer solution  0.25 mg Nebulization BID  . carvedilol  6.25 mg Oral BID WC  . colchicine  0.6 mg Oral Daily  . enoxaparin (LOVENOX) injection  40 mg Subcutaneous Q24H  . FLUoxetine  20 mg Oral Daily  . folic acid  1 mg Oral Daily  .  furosemide  80 mg Intravenous BID  . gabapentin  300 mg Oral BID  . hydrALAZINE  75 mg Oral 3 times per day  . insulin aspart  0-9 Units Subcutaneous TID WC  . ipratropium-albuterol  3 mL Nebulization TID  . isosorbide dinitrate  10 mg Oral TID  . levothyroxine  187 mcg Oral QAC breakfast  . magnesium oxide  400 mg Oral BID  . pantoprazole  40 mg Oral Daily  . piperacillin-tazobactam (ZOSYN)  IV  3.375 g Intravenous 3 times per day  . potassium chloride SA  40 mEq Oral BID  . sodium chloride  3 mL Intravenous Q12H  . sodium chloride  3 mL Intravenous Q12H  . tamoxifen  20 mg Oral QHS  . thiamine  100 mg Oral Daily  . venlafaxine XR  37.5 mg Oral Daily   Continuous Infusions:  PRN Meds:.sodium chloride, sodium chloride, acetaminophen, albuterol, ondansetron (ZOFRAN) IV, oxyCODONE, senna-docusate, sodium chloride, sodium chloride    PHYSICAL EXAM: Vital signs in last 24 hours: Filed Vitals:   08/17/15 0300 08/17/15 0400 08/17/15 0646 08/17/15 0906  BP:  145/90 163/101   Pulse: 86 80    Temp:  98 F (36.7 C)    TempSrc:      Resp: 25 18    Weight:  108.818 kg (239 lb 14.4 oz)    SpO2: 97% 97%  97%    Weight change:  Filed Weights   08/14/15 0616 08/15/15 0500 08/17/15 0400  Weight: 115.395 kg (254 lb 6.4 oz) 115.304 kg (254 lb 3.2 oz) 108.818 kg (239 lb 14.4 oz)   Body mass index is 36.49 kg/(m^2).   Gen Exam: Awake, mildly/pleasantly confused -speech clear Neck: Supple, No JVD.   Chest: Few bibasilar rales CVS: S1 S2 irregular, no murmurs.  Abdomen: soft,  BS +, mildly tender in mid right abd area , non distended.  Extremities: Unna boots in place Neurologic: Non Focal-but with generalized weakness  Skin: No Rash.  Wounds: N/A.   Intake/Output from previous day:  Intake/Output Summary (Last 24 hours) at 08/17/15 1041 Last data filed at 08/17/15 0800  Gross per 24 hour  Intake    610 ml  Output   3925 ml  Net  -3315 ml     LAB RESULTS: CBC  Recent  Labs Lab 08/11/15 1616 08/12/15 0308 08/13/15 0745 08/15/15 0317 08/17/15 0145  WBC 6.1 9.4 5.0 5.5 4.1  HGB 10.9* 11.2* 10.1* 10.8* 10.0*  HCT 37.1* 37.5* 33.1* 35.4* 33.6*  PLT 93* 106* 92* 101* 124*  MCV 101.9* 99.7 100.6* 99.7 98.5  MCH 29.9 29.8 30.7 30.4 29.3  MCHC 29.4* 29.9* 30.5 30.5 29.8*  RDW 16.1* 16.1* 16.2* 15.7* 15.5    Chemistries   Recent Labs Lab 08/13/15 0745 08/14/15 0456 08/15/15 0317 08/16/15 0245 08/17/15 0145  NA 135 135 137 138 138  K 3.9 4.6 4.7 4.6 3.5  CL 95* 95* 95* 98* 97*  CO2 34* 30 32 30 31  GLUCOSE 155* 176* 127* 147* 177*  BUN 39* 47* 49* 46* 40*  CREATININE 1.67* 2.09* 2.31* 2.15* 2.07*  CALCIUM 9.0 8.9 9.1 9.3 9.2    CBG:  Recent Labs Lab 08/16/15 0728 08/16/15 1146 08/16/15 1633 08/16/15 2057 08/17/15 0720  GLUCAP 129* 200* 206* 149* 144*    GFR Estimated Creatinine Clearance: 39.2 mL/min (by C-G formula based on Cr of 2.07).  Coagulation profile  Recent Labs Lab 08/12/15 1555  INR 1.43    Cardiac Enzymes  Recent Labs Lab 08/12/15 0308 08/12/15 0726 08/12/15 1255  TROPONINI 0.09* 0.08* 0.07*    Invalid input(s): POCBNP No results for input(s): DDIMER in the last 72 hours. No results for input(s): HGBA1C in the last 72 hours. No results for input(s): CHOL, HDL, LDLCALC, TRIG, CHOLHDL, LDLDIRECT in the last 72 hours. No results for input(s): TSH, T4TOTAL, T3FREE, THYROIDAB in the last 72 hours.  Invalid input(s): FREET3 No results for input(s): VITAMINB12, FOLATE, FERRITIN, TIBC, IRON, RETICCTPCT in the last 72 hours. No results for input(s): LIPASE, AMYLASE in the last 72 hours.  Urine Studies No results for input(s): UHGB, CRYS in the last 72 hours.  Invalid input(s): UACOL, UAPR, USPG, UPH, UTP, UGL, UKET, UBIL, UNIT, UROB, ULEU, UEPI, UWBC, URBC, UBAC, CAST, UCOM, BILUA  MICROBIOLOGY: Recent Results (from the past 240 hour(s))  MRSA PCR Screening     Status: None   Collection Time: 08/12/15   2:19 AM  Result Value Ref Range Status   MRSA by PCR NEGATIVE NEGATIVE Final    Comment:        The GeneXpert MRSA Assay (FDA approved for NASAL specimens only), is one component of a comprehensive MRSA colonization surveillance program. It is not intended to diagnose MRSA infection nor to guide or monitor treatment for MRSA infections.   Culture, blood (routine x 2) Call MD if unable to obtain prior to antibiotics being given     Status: None   Collection Time: 08/12/15  3:00 AM  Result Value Ref Range Status   Specimen Description BLOOD LEFT HAND  Final   Special Requests BOTTLES DRAWN AEROBIC AND ANAEROBIC 5CC  Final   Culture NO GROWTH 5 DAYS  Final   Report Status 08/17/2015 FINAL  Final  Culture, blood (routine x 2) Call MD if unable to obtain prior  to antibiotics being given     Status: None   Collection Time: 08/12/15  3:08 AM  Result Value Ref Range Status   Specimen Description BLOOD RIGHT HAND  Final   Special Requests IN PEDIATRIC BOTTLE 3CC  Final   Culture NO GROWTH 5 DAYS  Final   Report Status 08/17/2015 FINAL  Final    RADIOLOGY STUDIES/RESULTS: Dg Chest 2 View  08/11/2015  CLINICAL DATA:  Shortness of breath. Altered mental status. Choked while eating this morning. Possible aspiration. EXAM: CHEST  2 VIEW COMPARISON:  07/04/2015. FINDINGS: Stable enlarged cardiac silhouette and post CABG changes. Interval mild patchy and linear density at the right lung base. Stable prominence of the interstitial markings. Mild increase in prominence of the pulmonary vasculature. Breathing motion blurring on the lateral view. IMPRESSION: 1. Atelectasis at the right lung base. Superimposed aspiration is unlikely based on the current appearance. 2. Stable cardiomegaly and mild chronic interstitial lung disease with interval mild pulmonary vascular congestion. Electronically Signed   By: Claudie Revering M.D.   On: 08/11/2015 17:07   Ct Head Wo Contrast  08/11/2015  CLINICAL DATA:   Initial encounter for 69 YOM walking using his walker and fell backwards and hit his head today 08/11/15. EXAM: CT HEAD WITHOUT CONTRAST CT CERVICAL SPINE WITHOUT CONTRAST TECHNIQUE: Multidetector CT imaging of the head and cervical spine was performed following the standard protocol without intravenous contrast. Multiplanar CT image reconstructions of the cervical spine were also generated. COMPARISON:  Head CT of 05/26/2015. No prior cervical spine imaging. FINDINGS: CT HEAD FINDINGS Sinuses/Soft tissues: Mild left parietal scalp soft tissue thickening on image/series 67/4. Right maxillary sinus mucous retention cyst or polyp. No skull fracture. Clear mastoid air cells. Intracranial: Cerebral atrophy. Moderate low density in the periventricular white matter likely related to small vessel disease. Left cerebellar remote infarct. No mass lesion, hemorrhage, hydrocephalus, acute infarct, intra-axial, or extra-axial fluid collection. CT CERVICAL SPINE FINDINGS Spinal visualization through the bottom of T4. Prevertebral soft tissues are within normal limits. Dense carotid atherosclerosis bilaterally. No apical pneumothorax. Advanced spondylosis. Left worse than right neural foraminal narrowing at multiple levels. Mild central canal stenosis, most significant at C3-4. Prior median sternotomy. Maintenance of vertebral body height. Trace C3-4 retrolisthesis. From C7 inferiorly are mildly degraded secondary to patient body habitus. Facets are well-aligned. Coronal reformats demonstrate a normal C1-C2 articulation. IMPRESSION: 1. Mild left parietal scalp soft tissue swelling. No acute intracranial abnormality. 2. Advanced cervical spondylosis. Trace C3-4 is favored to be degenerative. Otherwise, no acute findings in the cervical spine identified. 3.  Cerebral atrophy and small vessel ischemic change. Electronically Signed   By: Abigail Miyamoto M.D.   On: 08/11/2015 16:50   Ct Angio Chest Pe W/cm &/or Wo Cm  08/11/2015   CLINICAL DATA:  Worsening dyspnea.  Altered mental status. EXAM: CT ANGIOGRAPHY CHEST WITH CONTRAST TECHNIQUE: Multidetector CT imaging of the chest was performed using the standard protocol during bolus administration of intravenous contrast. Multiplanar CT image reconstructions and MIPs were obtained to evaluate the vascular anatomy. CONTRAST:  49mL OMNIPAQUE IOHEXOL 350 MG/ML SOLN COMPARISON:  08/28/2014 FINDINGS: Cardiovascular: There is good opacification of the pulmonary arteries. There is no pulmonary embolism. The thoracic aorta is normal in caliber and intact. There is extensive calcified coronary artery plaque. There are multiple coronary artery bypass grafts. Lungs: There are multifocal confluent airspace opacities, central predominant. Considerations include alveolar edema, infectious infiltrate. Hemorrhage or neoplasm less likely but not entirely excluded. Central airways: Patent Effusions:  None Lymphadenopathy: Mildly prominent hilar and mediastinal nodes, more likely reactive Esophagus: Unremarkable Upper abdomen: Unremarkable Musculoskeletal: No significant abnormality except for moderate thoracic degenerative disc disease, kyphosis, and prior sternotomy. Review of the MIP images confirms the above findings. IMPRESSION: 1. Negative for acute pulmonary embolism 2. Confluent airspace opacities bilaterally, right worse than left. Infectious infiltrate is a leading consideration. Alveolar edema less likely but not excluded. There also are mildly prominent hilar and mediastinal nodes which may be reactive. Electronically Signed   By: Andreas Newport M.D.   On: 08/11/2015 23:21   Ct Cervical Spine Wo Contrast  08/11/2015  CLINICAL DATA:  Initial encounter for 69 YOM walking using his walker and fell backwards and hit his head today 08/11/15. EXAM: CT HEAD WITHOUT CONTRAST CT CERVICAL SPINE WITHOUT CONTRAST TECHNIQUE: Multidetector CT imaging of the head and cervical spine was performed following the  standard protocol without intravenous contrast. Multiplanar CT image reconstructions of the cervical spine were also generated. COMPARISON:  Head CT of 05/26/2015. No prior cervical spine imaging. FINDINGS: CT HEAD FINDINGS Sinuses/Soft tissues: Mild left parietal scalp soft tissue thickening on image/series 67/4. Right maxillary sinus mucous retention cyst or polyp. No skull fracture. Clear mastoid air cells. Intracranial: Cerebral atrophy. Moderate low density in the periventricular white matter likely related to small vessel disease. Left cerebellar remote infarct. No mass lesion, hemorrhage, hydrocephalus, acute infarct, intra-axial, or extra-axial fluid collection. CT CERVICAL SPINE FINDINGS Spinal visualization through the bottom of T4. Prevertebral soft tissues are within normal limits. Dense carotid atherosclerosis bilaterally. No apical pneumothorax. Advanced spondylosis. Left worse than right neural foraminal narrowing at multiple levels. Mild central canal stenosis, most significant at C3-4. Prior median sternotomy. Maintenance of vertebral body height. Trace C3-4 retrolisthesis. From C7 inferiorly are mildly degraded secondary to patient body habitus. Facets are well-aligned. Coronal reformats demonstrate a normal C1-C2 articulation. IMPRESSION: 1. Mild left parietal scalp soft tissue swelling. No acute intracranial abnormality. 2. Advanced cervical spondylosis. Trace C3-4 is favored to be degenerative. Otherwise, no acute findings in the cervical spine identified. 3.  Cerebral atrophy and small vessel ischemic change. Electronically Signed   By: Abigail Miyamoto M.D.   On: 08/11/2015 16:50   Mr Brain Wo Contrast  08/12/2015  CLINICAL DATA:  Initial evaluation for syncope. EXAM: MRI HEAD WITHOUT CONTRAST TECHNIQUE: Multiplanar, multiecho pulse sequences of the brain and surrounding structures were obtained without intravenous contrast. COMPARISON:  Prior CT from on 08/11/2015. FINDINGS: Diffuse prominence  of the CSF containing spaces is compatible with generalized age-related cerebral atrophy. Patchy T2/FLAIR hyperintensity within the periventricular and deep white matter both cerebral hemispheres most likely related chronic small vessel ischemic disease. Encephalomalacia within the left cerebellar hemisphere consistent with remote infarct. Associated chronic hemosiderin staining present within this region. Probable remote infarct within the anteromedial left frontal lobe as well. There is a possible tiny punctate 4 mm focus of high signal intensity on DWI sequence involving the cortical gray matter of the posterior right frontal region (series 3, image 31 on axial sequence, series 5, image 14 on coronal sequence). Finding may reflect a tiny acute ischemic infarct. Corresponding signal loss not definitely seen on ADC map, although this is felt to be too small to be resolved. No other definite acute intracranial infarct. Abnormal flow void within the distal left vertebral artery, which may related to focal plaque or possibly chronic dissection (series 8, image 3), similar to prior. Major intracranial vascular flow voids otherwise maintained. The right vertebral artery is diminutive.  No acute intracranial hemorrhage. No mass lesion, midline shift, or mass effect. No hydrocephalus. No extra-axial fluid collection. Craniocervical junction grossly within normal limits. Degenerative spondylolysis noted within the partially visualized upper cervical spine. Pituitary gland grossly normal. No acute abnormality about the orbits. Sequela prior lens extraction present on the left. Mild mucosal thickening within the maxillary sinuses and ethmoidal air cells. Paranasal sinuses are otherwise clear. Minimal opacity within the right mastoid air cells. Inner ear structures normal. Bone marrow signal intensity within normal limits. No scalp soft tissue abnormality. IMPRESSION: 1. Question 4 mm punctate focus of high signal intensity on  DWI sequence within the posterior right frontal lobe. Finding favored to reflect artifact, although possible tiny acute ischemic infarct is not entirely excluded, and could be considered in the correct clinical setting. 2. No other acute intracranial process. 3. 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 4. Abnormal flow void within the distal left vertebral artery, which may be related to focal plaque or possibly chronic dissection. This is similar relative to prior study. 5. Age-related cerebral atrophy with mild to moderate chronic small vessel ischemic disease. Electronically Signed   By: Jeannine Boga M.D.   On: 08/12/2015 02:40   US Renal  08/14/2015  CLINICAL DATA:  69 year old male with acute renal failure EXAM: RENAL / URINARY TRACT ULTRASOUND COMPLETE COMPARISON:  CT scan of the abdomen and pelvis 07/01/2015 FINDINGS: Right Kidney: Length: 12.2 cm. Technically challenging examination secondary to patient body habitus and diffuse edema. The right kidney is very poorly seen. No obvious hydronephrosis. Left Kidney: Length: 12.3 cm. Poorly seen. No hydronephrosis. 4.3 cm anechoic simple cyst in the interpolar region. Bladder: Not visualized. IMPRESSION: 1. Technically limited study secondary to patient body habitus and diffuse soft tissue edema. 2. The right kidney is very poorly seen.  No obvious hydronephrosis. 3. No left-sided hydronephrosis. 4. Simple renal cysts on the left. Electronically Signed   By: Jacqulynn Cadet M.D.   On: 08/14/2015 21:25   Dg Chest Port 1 View  08/15/2015  CLINICAL DATA:  Short of breath, COPD, asthma EXAM: PORTABLE CHEST 1 VIEW COMPARISON:  Radiograph 08/12/2015 FINDINGS: Sternotomy wires overlie normal cardiac silhouette. Linear opacity at the RIGHT lung base again demonstrated slightly improved. Patchy perihilar airspace disease again demonstrated. No pneumothorax. IMPRESSION: 1. Slight improvement in density of RIGHT  lower lobe atelectasis versus infiltrate. 2. No change in perihilar opacities representing mild edema or infection. Electronically Signed   By: Suzy Bouchard M.D.   On: 08/15/2015 08:02   Dg Abd Acute W/chest  08/12/2015  CLINICAL DATA:  Episode of nausea and vomiting earlier, no abdominal pain, history of bowel resection and breast malignancy. EXAM: DG ABDOMEN ACUTE W/ 1V CHEST COMPARISON:  Portable chest x-ray of August 11, 2015 and chest CT scan of the same day FINDINGS: The lungs are borderline hypoinflated. Confluent alveolar opacities are present at both lung bases greater on the right than on the left. A trace of pleural fluid on the right is suspected. The cardiac silhouette is mildly enlarged. The central pulmonary vascularity is engorged and indistinct. The patient has undergone previous CABG. The stool burden within the colon is mildly increased. There is a moderate amount of gas within loops of the sigmoid colon. There is no small or large bowel obstructive pattern and no evidence of perforation. There is degenerative change of the lower lumbar spine. There is contrast within the urinary bladder from the earlier CT scan. There is splenic  arterial calcification. IMPRESSION: 1. Patchy airspace opacities bilaterally consistent with pneumonia. A small amount of pleural fluid is likely present on the right. 2. CHF with mild pulmonary vascular congestion. 3. No acute intra-abdominal abnormality is observed. Increased colonic stool burden may reflect constipation in the appropriate clinical setting. Electronically Signed   By: David  Martinique M.D.   On: 08/12/2015 08:16    Oren Binet, MD  [facility] Pager:[phone]  If 7PM-7AM, please contact night-coverage [email or web address] Password [email or web address] 08/17/2015, 10:41 AM   LOS: 6 days

## 2015-08-18 ENCOUNTER — Encounter (HOSPITAL_COMMUNITY): Payer: Self-pay | Admitting: Interventional Cardiology

## 2015-08-18 DIAGNOSIS — I481 Persistent atrial fibrillation: Secondary | ICD-10-CM

## 2015-08-18 DIAGNOSIS — I4819 Other persistent atrial fibrillation: Secondary | ICD-10-CM | POA: Insufficient documentation

## 2015-08-18 LAB — COMPREHENSIVE METABOLIC PANEL
ALBUMIN: 2.6 g/dL — AB (ref 3.5–5.0)
ALK PHOS: 41 U/L (ref 38–126)
ALT: 10 U/L — ABNORMAL LOW (ref 17–63)
ANION GAP: 8 (ref 5–15)
AST: 15 U/L (ref 15–41)
BILIRUBIN TOTAL: 0.7 mg/dL (ref 0.3–1.2)
BUN: 34 mg/dL — AB (ref 6–20)
CALCIUM: 8.9 mg/dL (ref 8.9–10.3)
CO2: 30 mmol/L (ref 22–32)
Chloride: 100 mmol/L — ABNORMAL LOW (ref 101–111)
Creatinine, Ser: 1.76 mg/dL — ABNORMAL HIGH (ref 0.61–1.24)
GFR calc Af Amer: 43 mL/min — ABNORMAL LOW (ref >60)
GFR, EST NON AFRICAN AMERICAN: 37 mL/min — AB (ref >60)
GLUCOSE: 147 mg/dL — AB (ref 65–99)
Potassium: 3.6 mmol/L (ref 3.5–5.1)
Sodium: 138 mmol/L (ref 135–145)
TOTAL PROTEIN: 6.6 g/dL (ref 6.5–8.1)

## 2015-08-18 LAB — GLUCOSE, CAPILLARY
GLUCOSE-CAPILLARY: 131 mg/dL — AB (ref 65–99)
Glucose-Capillary: 179 mg/dL — ABNORMAL HIGH (ref 65–99)
Glucose-Capillary: 187 mg/dL — ABNORMAL HIGH (ref 65–99)
Glucose-Capillary: 195 mg/dL — ABNORMAL HIGH (ref 65–99)

## 2015-08-18 LAB — MAGNESIUM: Magnesium: 1.6 mg/dL — ABNORMAL LOW (ref 1.7–2.4)

## 2015-08-18 MED ORDER — IPRATROPIUM-ALBUTEROL 0.5-2.5 (3) MG/3ML IN SOLN
3.0000 mL | Freq: Two times a day (BID) | RESPIRATORY_TRACT | Status: DC
  Administered 2015-08-18 – 2015-08-22 (×9): 3 mL via RESPIRATORY_TRACT
  Filled 2015-08-18 (×9): qty 3

## 2015-08-18 MED ORDER — IPRATROPIUM-ALBUTEROL 0.5-2.5 (3) MG/3ML IN SOLN: 3.0000 mL | RESPIRATORY_TRACT | Status: DC | PRN

## 2015-08-18 MED ORDER — POTASSIUM CHLORIDE CRYS ER 20 MEQ PO TBCR
40.0000 meq | EXTENDED_RELEASE_TABLET | Freq: Once | ORAL | Status: AC
  Administered 2015-08-18: 40 meq via ORAL
  Filled 2015-08-18: qty 2

## 2015-08-18 NOTE — Care Management Important Message (Signed)
[place] Message  Patient Details  Name: [name] MRN: [id number] Date of Birth: 03-22-1946   Medicare Important Message Given:  Yes-third notification given    Nathen May 08/18/2015, 3:41 PM

## 2015-08-18 NOTE — Progress Notes (Signed)
No change in d/c plan. Patient will be placed at [facility] and rehab when medically stable per MD.  Clinical update provided to facility Liaison. Not stable today.  Lorie Phenix. Pauline Good, [place]

## 2015-08-18 NOTE — Progress Notes (Signed)
Pt refuses to wear CPAP tonight.

## 2015-08-18 NOTE — Progress Notes (Addendum)
PATIENT DETAILS Name: [name] Age: 69 y.o. Sex: male Date of Birth: [birth year] Admit Date: 08/11/2015 Admitting Physician Rise Patience, MD HQI:[id number],MWUXL, MD  Brief narrative:  69 year old Caucasian male with history of chronic diastolic heart failure, prior history of alcohol abuse, chronic kidney disease stage III, diabetes, CAD status post CABG-admitted for evaluation of syncopal episode after vomiting. Found to have suspected aspiration pneumonia, acute diastolic heart failure causing acute hypoxic respiratory failure. Hospital course complicated by development of worsening renal failure. Cardiology performed [place] on 11/4 which showed severe pulmonary hypertension. See below for further details   Subjective: Less short of breath, much more awake and alert.  Assessment/Plan: Principal Problem: Acute respiratory failure with hypoxia: Suspect multifactorial-from probable aspiration pneumonia,  Acute diastolic heart failure, underlying pulmonary hypertension. Slowly improving with empiric Zosyn and Lasix/Diamox. Titrate off oxygen as tolerated-follow  Active Problems: ?Syncope/Pre syncope: In a setting of vomiting-? Vagal. Telemetry-shows A. fib-but no other arrhythmias. Recent echocardiogram on 06/12/15 shows EF around 60-65%. EEG negative for seizure-like activity. Continue to monitor in telemetry. Cardiology following   Probable aspiration pneumonia: Improving, remains afebrile and without leukocytosis. Completed 7 days of  Zosyn on 11/7. Blood cultures negative so far, urine streptococcal  and  Legionella antigen negative  Acute on chronic diastolic heart failure/right sided heart failure: Dry weight appears to be around 244 pounds-significant diuresis with IV Lasix and Diamox-weight now decreased to 235 pounds, much improved-cardiology recommending one additional day of hospitalization-suspect SNF on 11/9.  Acute on CKD stage 3: Acute renal failure  likely multifactorial in a setting of acute illness/prerenal azotemia/diuretics/possible contrast-induced nephropathy. Suspect CKD likely secondary to underlying diabetic nephropathy. Creatinine now down trending, continue with current dosing of diuretics. Note renal ultrasound negative for hydronephrosis.   Acute encephalopathy:Has steadily improved throughout this hospital course-much more alert and awake today.Suspect intermittent sundowning/delirium in the setting of undiagnosed mild dementia/cognitive dysfunction.Per patient and family last use of EtOH was at least 2-4 months back-therefore do not think has alcohol withdrawal.  Acute CVA: Suspect more of an incidental finding. Has a history of atrial fibrillation-considered a poor candidate for long-term anticoagulation given history of alcohol use and thrombocytopenia.CHA2DS2-VASc score 6.A1c 6.8, LDL 65. Carotid Doppler without significant stenosis.Continue with aspirin and statin.  ? Abdominal pain:No tenderness of exam, CT abdomen negative.No further work up required  Anemia/thrombocytopenia: Chronic issue, stable for outpatient work up  Minimally elevated troponins: May be demand ischemia or false positive elevation in the setting of chronic kidney disease. No workup required, continue with aspirin, statin and beta blockers  Atrial fibrillation: See above regarding anticoagulation-rate controlled with Coreg.  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 Chronic small ulceration in the anterior abdominal wall that appears stable  History of right breast cancer: Status post mastectomy, continue tamoxifen.Follow with Dr Lindi Adie  Essential hypertension:controlled, continue Coreg, hydralazine and Imdur. Follow BP   Type 2 diabetes: CBGs stable-with SSI.  A1c 6.8. Previously was on oral agents that was discontinued in the past because of  hypoglycemia-will need to be reassessed on discharge   Hypothyroidism: Continue with Synthroid  COPD: Continue with bronchodilators-this appears stable  Gout: Continue colchicine-appears stable  CAD status post CABG: Continue aspirin, statins and beta blockers  OSA: Related to obesity-continue CPAP  Deconditioning: Suspect underlying failure to thrive syndrome-Will likely require SNF on discharge  Disposition: Remain suspect SNF 11/9   Antimicrobial agents  See below  Anti-infectives    Start     Dose/Rate Route Frequency Ordered Stop   08/18/15 0000  piperacillin-tazobactam (ZOSYN) IVPB 3.375 g     3.375 g 12.5 mL/hr over 240 Minutes Intravenous 3 times per day 08/17/15 2047 08/18/15 2119   08/12/15 1000  vancomycin (VANCOCIN) IVPB 750 mg/150 ml premix  Status:  Discontinued     750 mg 150 mL/hr over 60 Minutes Intravenous Every 12 hours 08/12/15 0215 08/14/15 0906   08/12/15 0230  vancomycin (VANCOCIN) 1,250 mg in sodium chloride 0.9 % 250 mL IVPB     1,250 mg 166.7 mL/hr over 90 Minutes Intravenous  Once 08/12/15 0215 08/12/15 0542   08/12/15 0230  piperacillin-tazobactam (ZOSYN) IVPB 3.375 g  Status:  Discontinued     3.375 g 12.5 mL/hr over 240 Minutes Intravenous 3 times per day 08/12/15 0215 08/17/15 2047      DVT Prophylaxis: Prophylactic Lovenox   Code Status: Full code   Family Communication Daughter over the phone  Procedures: None  CONSULTS:  cardiology and neurology  Time spent 30 minutes-Greater than 50% of this time was spent in counseling, explanation of diagnosis, planning of further management, and coordination of care.  MEDICATIONS: Scheduled Meds: . acetaZOLAMIDE  500 mg Oral BID  . allopurinol  300 mg Oral Daily  . aspirin EC  325 mg Oral Daily  . atorvastatin  20 mg Oral Daily  . budesonide (PULMICORT) nebulizer solution  0.25 mg Nebulization BID  . carvedilol  6.25 mg Oral BID WC  . colchicine  0.6 mg Oral Daily  . enoxaparin  (LOVENOX) injection  40 mg Subcutaneous Q24H  . FLUoxetine  20 mg Oral Daily  . folic acid  1 mg Oral Daily  . furosemide  80 mg Intravenous BID  . gabapentin  300 mg Oral BID  . hydrALAZINE  100 mg Oral 3 times per day  . insulin aspart  0-9 Units Subcutaneous TID WC  . ipratropium-albuterol  3 mL Nebulization BID  . isosorbide dinitrate  10 mg Oral TID  . levothyroxine  187 mcg Oral QAC breakfast  . magnesium oxide  400 mg Oral BID  . pantoprazole  40 mg Oral Daily  . potassium chloride SA  40 mEq Oral BID  . sodium chloride  3 mL Intravenous Q12H  . sodium chloride  3 mL Intravenous Q12H  . tamoxifen  20 mg Oral QHS  . thiamine  100 mg Oral Daily  . venlafaxine XR  37.5 mg Oral Daily   Continuous Infusions:  PRN Meds:.sodium chloride, sodium chloride, acetaminophen, albuterol, ondansetron (ZOFRAN) IV, oxyCODONE, senna-docusate, sodium chloride, sodium chloride    PHYSICAL EXAM: Vital signs in last 24 hours: Filed Vitals:   08/19/15 0406 08/19/15 0821 08/19/15 1009 08/19/15 1012  BP: 149/68 132/78    Pulse: 98 85    Temp: 98.8 F (37.1 C)     TempSrc: Axillary     Resp: 18     Weight: 106.731 kg (235 lb 4.8 oz)     SpO2: 95%  98% 99%    Weight change: -1.225 kg (-2 lb 11.2 oz) Filed Weights   08/17/15 0400 08/18/15 0500 08/19/15 0406  Weight: 108.818 kg (239 lb 14.4 oz) 107.956 kg (238 lb) 106.731 kg (235 lb 4.8 oz)   Body mass index is 35.79 kg/(m^2).   Gen Exam: Awake,alert-speech clear Neck: Supple, No JVD.   Chest: Few bibasilar rales  CVS: S1 S2 irregular, no murmurs.  Abdomen: soft, BS +, mildly tender in mid right abd area , non distended.  Extremities: Unna boots in place Neurologic: Non Focal-but with generalized weakness  Skin: No Rash.  Wounds: N/A.   Intake/Output from previous day:  Intake/Output Summary (Last 24 hours) at 08/19/15 1025 Last data filed at 08/19/15 0950  Gross per 24 hour  Intake   2265 ml  Output   2125 ml  Net    140 ml      LAB RESULTS: CBC  Recent Labs Lab 08/13/15 0745 08/15/15 0317 08/17/15 0145  WBC 5.0 5.5 4.1  HGB 10.1* 10.8* 10.0*  HCT 33.1* 35.4* 33.6*  PLT 92* 101* 124*  MCV 100.6* 99.7 98.5  MCH 30.7 30.4 29.3  MCHC 30.5 30.5 29.8*  RDW 16.2* 15.7* 15.5    Chemistries   Recent Labs Lab 08/16/15 0245 08/17/15 0145 08/17/15 1118 08/18/15 0509 08/19/15 0508  NA 138 138 140 138 138  K 4.6 3.5 3.7 3.6 4.2  CL 98* 97* 99* 100* 99*  CO2 30 31 30 30 28   GLUCOSE 147* 177* 173* 147* 147*  BUN 46* 40* 37* 34* 34*  CREATININE 2.15* 2.07* 1.94* 1.76* 1.82*  CALCIUM 9.3 9.2 9.2 8.9 9.0  MG  --   --   --  1.6*  --     CBG:  Recent Labs Lab 08/18/15 0749 08/18/15 1139 08/18/15 1649 08/18/15 2057 08/19/15 0709  GLUCAP 131* 179* 187* 195* 158*    GFR Estimated Creatinine Clearance: 44.1 mL/min (by C-G formula based on Cr of 1.82).  Coagulation profile  Recent Labs Lab 08/12/15 1555  INR 1.43    Cardiac Enzymes  Recent Labs Lab 08/12/15 1255  TROPONINI 0.07*    Invalid input(s): POCBNP No results for input(s): DDIMER in the last 72 hours. No results for input(s): HGBA1C in the last 72 hours. No results for input(s): CHOL, HDL, LDLCALC, TRIG, CHOLHDL, LDLDIRECT in the last 72 hours. No results for input(s): TSH, T4TOTAL, T3FREE, THYROIDAB in the last 72 hours.  Invalid input(s): FREET3 No results for input(s): VITAMINB12, FOLATE, FERRITIN, TIBC, IRON, RETICCTPCT in the last 72 hours. No results for input(s): LIPASE, AMYLASE in the last 72 hours.  Urine Studies No results for input(s): UHGB, CRYS in the last 72 hours.  Invalid input(s): UACOL, UAPR, USPG, UPH, UTP, UGL, UKET, UBIL, UNIT, UROB, ULEU, UEPI, UWBC, URBC, UBAC, CAST, UCOM, BILUA  MICROBIOLOGY: Recent Results (from the past 240 hour(s))  MRSA PCR Screening     Status: None   Collection Time: 08/12/15  2:19 AM  Result Value Ref Range Status   MRSA by PCR NEGATIVE NEGATIVE Final    Comment:         The GeneXpert MRSA Assay (FDA approved for NASAL specimens only), is one component of a comprehensive MRSA colonization surveillance program. It is not intended to diagnose MRSA infection nor to guide or monitor treatment for MRSA infections.   Culture, blood (routine x 2) Call MD if unable to obtain prior to antibiotics being given     Status: None   Collection Time: 08/12/15  3:00 AM  Result Value Ref Range Status   Specimen Description BLOOD LEFT HAND  Final   Special Requests BOTTLES DRAWN AEROBIC AND ANAEROBIC 5CC  Final   Culture NO GROWTH 5 DAYS  Final   Report Status 08/17/2015 FINAL  Final  Culture, blood (routine x 2) Call MD if unable to obtain prior to  antibiotics being given     Status: None   Collection Time: 08/12/15  3:08 AM  Result Value Ref Range Status   Specimen Description BLOOD RIGHT HAND  Final   Special Requests IN PEDIATRIC BOTTLE 3CC  Final   Culture NO GROWTH 5 DAYS  Final   Report Status 08/17/2015 FINAL  Final    RADIOLOGY STUDIES/RESULTS: Ct Abdomen Pelvis Wo Contrast  08/17/2015  CLINICAL DATA:  Right upper quadrant abdominal pain and right upper quadrant tendinous on physical examination today. EXAM: CT ABDOMEN AND PELVIS WITHOUT CONTRAST TECHNIQUE: Multidetector CT imaging of the abdomen and pelvis was performed following the standard protocol without IV contrast. COMPARISON:  07/01/2015 FINDINGS: Lower chest: Small bilateral pleural effusions with overlying atelectasis. The heart is enlarged. Advanced three-vessel coronary artery calcifications and moderate aortic calcifications. The distal esophagus is grossly normal. Hepatobiliary: No focal hepatic lesions or intrahepatic biliary dilatation. The gallbladder is normal. No common bile duct dilatation. Pancreas: Mild diffuse atrophy. No mass, inflammation or ductal dilatation. Spleen: Normal size.  No focal lesions. Adrenals/Urinary Tract: The adrenal glands are normal. Stable bilateral renal cysts.  No worrisome renal lesion, renal calculi or obstructing ureteral calculi. No bladder calculi. Mild uniform bladder wall thickening. Stomach/Bowel: The stomach, duodenum, small bowel and colon are grossly normal without oral contrast. No obvious inflammatory changes, mass lesions or obstructive findings. There is diverticulosis involving the descending and sigmoid colon without definite findings for acute diverticulitis. Moderate stool throughout the colon and down into the rectum may suggest constipation. The terminal ileum is normal. The appendix is normal. Vascular/Lymphatic: No mesenteric or retroperitoneal mass or adenopathy. Stable small scattered lymph nodes. Stable aortic and branch vessel ostial calcifications. No focal aneurysm. Other: Mild uniform bladder wall thickening. The prostate gland and seminal vesicles are unremarkable. No pelvic mass or adenopathy. No free pelvic fluid collections. Small scattered lymph nodes are stable. No inguinal mass or adenopathy. Stable surgical changes involving the anterior abdominal wall. Musculoskeletal: No significant bony findings. Severe degenerative changes involving the spine appears stable. There may be progress of advanced degenerative changes at L4-5 but I do not see any obvious paraspinal process to suggest infection. IMPRESSION: 1. Small bilateral pleural effusions and overlying atelectasis. 2. Advanced three-vessel coronary artery calcifications and moderate atherosclerotic calcification involving the thoracic and abdominal aorta and branch vessels. 3. Numerous bilateral renal cysts appear stable. 4. No acute abdominal/pelvic findings, mass lesions or adenopathy. 5. Mild uniform bladder wall thickening without discrete mass. 6. Severe and progressive degenerative changes in the lower lumbar spine, particularly at L4-5. Electronically Signed   By: Marijo Sanes M.D.   On: 08/17/2015 16:02   Dg Chest 2 View  08/11/2015  CLINICAL DATA:  Shortness of breath.  Altered mental status. Choked while eating this morning. Possible aspiration. EXAM: CHEST  2 VIEW COMPARISON:  07/04/2015. FINDINGS: Stable enlarged cardiac silhouette and post CABG changes. Interval mild patchy and linear density at the right lung base. Stable prominence of the interstitial markings. Mild increase in prominence of the pulmonary vasculature. Breathing motion blurring on the lateral view. IMPRESSION: 1. Atelectasis at the right lung base. Superimposed aspiration is unlikely based on the current appearance. 2. Stable cardiomegaly and mild chronic interstitial lung disease with interval mild pulmonary vascular congestion. Electronically Signed   By: Claudie Revering M.D.   On: 08/11/2015 17:07   Ct Head Wo Contrast  08/11/2015  CLINICAL DATA:  Initial encounter for 69 YOM walking using his walker and fell backwards and hit  his head today 08/11/15. EXAM: CT HEAD WITHOUT CONTRAST CT CERVICAL SPINE WITHOUT CONTRAST TECHNIQUE: Multidetector CT imaging of the head and cervical spine was performed following the standard protocol without intravenous contrast. Multiplanar CT image reconstructions of the cervical spine were also generated. COMPARISON:  Head CT of 05/26/2015. No prior cervical spine imaging. FINDINGS: CT HEAD FINDINGS Sinuses/Soft tissues: Mild left parietal scalp soft tissue thickening on image/series 67/4. Right maxillary sinus mucous retention cyst or polyp. No skull fracture. Clear mastoid air cells. Intracranial: Cerebral atrophy. Moderate low density in the periventricular white matter likely related to small vessel disease. Left cerebellar remote infarct. No mass lesion, hemorrhage, hydrocephalus, acute infarct, intra-axial, or extra-axial fluid collection. CT CERVICAL SPINE FINDINGS Spinal visualization through the bottom of T4. Prevertebral soft tissues are within normal limits. Dense carotid atherosclerosis bilaterally. No apical pneumothorax. Advanced spondylosis. Left worse than  right neural foraminal narrowing at multiple levels. Mild central canal stenosis, most significant at C3-4. Prior median sternotomy. Maintenance of vertebral body height. Trace C3-4 retrolisthesis. From C7 inferiorly are mildly degraded secondary to patient body habitus. Facets are well-aligned. Coronal reformats demonstrate a normal C1-C2 articulation. IMPRESSION: 1. Mild left parietal scalp soft tissue swelling. No acute intracranial abnormality. 2. Advanced cervical spondylosis. Trace C3-4 is favored to be degenerative. Otherwise, no acute findings in the cervical spine identified. 3.  Cerebral atrophy and small vessel ischemic change. Electronically Signed   By: Abigail Miyamoto M.D.   On: 08/11/2015 16:50   Ct Angio Chest Pe W/cm &/or Wo Cm  08/11/2015  CLINICAL DATA:  Worsening dyspnea.  Altered mental status. EXAM: CT ANGIOGRAPHY CHEST WITH CONTRAST TECHNIQUE: Multidetector CT imaging of the chest was performed using the standard protocol during bolus administration of intravenous contrast. Multiplanar CT image reconstructions and MIPs were obtained to evaluate the vascular anatomy. CONTRAST:  64mL OMNIPAQUE IOHEXOL 350 MG/ML SOLN COMPARISON:  08/28/2014 FINDINGS: Cardiovascular: There is good opacification of the pulmonary arteries. There is no pulmonary embolism. The thoracic aorta is normal in caliber and intact. There is extensive calcified coronary artery plaque. There are multiple coronary artery bypass grafts. Lungs: There are multifocal confluent airspace opacities, central predominant. Considerations include alveolar edema, infectious infiltrate. Hemorrhage or neoplasm less likely but not entirely excluded. Central airways: Patent Effusions: None Lymphadenopathy: Mildly prominent hilar and mediastinal nodes, more likely reactive Esophagus: Unremarkable Upper abdomen: Unremarkable Musculoskeletal: No significant abnormality except for moderate thoracic degenerative disc disease, kyphosis, and prior  sternotomy. Review of the MIP images confirms the above findings. IMPRESSION: 1. Negative for acute pulmonary embolism 2. Confluent airspace opacities bilaterally, right worse than left. Infectious infiltrate is a leading consideration. Alveolar edema less likely but not excluded. There also are mildly prominent hilar and mediastinal nodes which may be reactive. Electronically Signed   By: Andreas Newport M.D.   On: 08/11/2015 23:21   Ct Cervical Spine Wo Contrast  08/11/2015  CLINICAL DATA:  Initial encounter for 69 YOM walking using his walker and fell backwards and hit his head today 08/11/15. EXAM: CT HEAD WITHOUT CONTRAST CT CERVICAL SPINE WITHOUT CONTRAST TECHNIQUE: Multidetector CT imaging of the head and cervical spine was performed following the standard protocol without intravenous contrast. Multiplanar CT image reconstructions of the cervical spine were also generated. COMPARISON:  Head CT of 05/26/2015. No prior cervical spine imaging. FINDINGS: CT HEAD FINDINGS Sinuses/Soft tissues: Mild left parietal scalp soft tissue thickening on image/series 67/4. Right maxillary sinus mucous retention cyst or polyp. No skull fracture. Clear mastoid air cells.  Intracranial: Cerebral atrophy. Moderate low density in the periventricular white matter likely related to small vessel disease. Left cerebellar remote infarct. No mass lesion, hemorrhage, hydrocephalus, acute infarct, intra-axial, or extra-axial fluid collection. CT CERVICAL SPINE FINDINGS Spinal visualization through the bottom of T4. Prevertebral soft tissues are within normal limits. Dense carotid atherosclerosis bilaterally. No apical pneumothorax. Advanced spondylosis. Left worse than right neural foraminal narrowing at multiple levels. Mild central canal stenosis, most significant at C3-4. Prior median sternotomy. Maintenance of vertebral body height. Trace C3-4 retrolisthesis. From C7 inferiorly are mildly degraded secondary to patient body  habitus. Facets are well-aligned. Coronal reformats demonstrate a normal C1-C2 articulation. IMPRESSION: 1. Mild left parietal scalp soft tissue swelling. No acute intracranial abnormality. 2. Advanced cervical spondylosis. Trace C3-4 is favored to be degenerative. Otherwise, no acute findings in the cervical spine identified. 3.  Cerebral atrophy and small vessel ischemic change. Electronically Signed   By: Abigail Miyamoto M.D.   On: 08/11/2015 16:50   Mr Brain Wo Contrast  08/12/2015  CLINICAL DATA:  Initial evaluation for syncope. EXAM: MRI HEAD WITHOUT CONTRAST TECHNIQUE: Multiplanar, multiecho pulse sequences of the brain and surrounding structures were obtained without intravenous contrast. COMPARISON:  Prior CT from on 08/11/2015. FINDINGS: Diffuse prominence of the CSF containing spaces is compatible with generalized age-related cerebral atrophy. Patchy T2/FLAIR hyperintensity within the periventricular and deep white matter both cerebral hemispheres most likely related chronic small vessel ischemic disease. Encephalomalacia within the left cerebellar hemisphere consistent with remote infarct. Associated chronic hemosiderin staining present within this region. Probable remote infarct within the anteromedial left frontal lobe as well. There is a possible tiny punctate 4 mm focus of high signal intensity on DWI sequence involving the cortical gray matter of the posterior right frontal region (series 3, image 31 on axial sequence, series 5, image 14 on coronal sequence). Finding may reflect a tiny acute ischemic infarct. Corresponding signal loss not definitely seen on ADC map, although this is felt to be too small to be resolved. No other definite acute intracranial infarct. Abnormal flow void within the distal left vertebral artery, which may related to focal plaque or possibly chronic dissection (series 8, image 3), similar to prior. Major intracranial vascular flow voids otherwise maintained. The right  vertebral artery is diminutive. No acute intracranial hemorrhage. No mass lesion, midline shift, or mass effect. No hydrocephalus. No extra-axial fluid collection. Craniocervical junction grossly within normal limits. Degenerative spondylolysis noted within the partially visualized upper cervical spine. Pituitary gland grossly normal. No acute abnormality about the orbits. Sequela prior lens extraction present on the left. Mild mucosal thickening within the maxillary sinuses and ethmoidal air cells. Paranasal sinuses are otherwise clear. Minimal opacity within the right mastoid air cells. Inner ear structures normal. Bone marrow signal intensity within normal limits. No scalp soft tissue abnormality. IMPRESSION: 1. Question 4 mm punctate focus of high signal intensity on DWI sequence within the posterior right frontal lobe. Finding favored to reflect artifact, although possible tiny acute ischemic infarct is not entirely excluded, and could be considered in the correct clinical setting. 2. No other acute intracranial process. 3. 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 4. Abnormal flow void within the distal left vertebral artery, which may be related to focal plaque or possibly chronic dissection. This is similar relative to prior study. 5. Age-related cerebral atrophy with mild to moderate chronic small vessel ischemic disease. Electronically Signed   By: Jeannine Boga M.D.   On: 08/12/2015 02:40  US Renal  08/14/2015  CLINICAL DATA:  69 year old male with acute renal failure EXAM: RENAL / URINARY TRACT ULTRASOUND COMPLETE COMPARISON:  CT scan of the abdomen and pelvis 07/01/2015 FINDINGS: Right Kidney: Length: 12.2 cm. Technically challenging examination secondary to patient body habitus and diffuse edema. The right kidney is very poorly seen. No obvious hydronephrosis. Left Kidney: Length: 12.3 cm. Poorly seen. No hydronephrosis. 4.3 cm anechoic simple  cyst in the interpolar region. Bladder: Not visualized. IMPRESSION: 1. Technically limited study secondary to patient body habitus and diffuse soft tissue edema. 2. The right kidney is very poorly seen.  No obvious hydronephrosis. 3. No left-sided hydronephrosis. 4. Simple renal cysts on the left. Electronically Signed   By: Jacqulynn Cadet M.D.   On: 08/14/2015 21:25   Dg Chest Port 1 View  08/15/2015  CLINICAL DATA:  Short of breath, COPD, asthma EXAM: PORTABLE CHEST 1 VIEW COMPARISON:  Radiograph 08/12/2015 FINDINGS: Sternotomy wires overlie normal cardiac silhouette. Linear opacity at the RIGHT lung base again demonstrated slightly improved. Patchy perihilar airspace disease again demonstrated. No pneumothorax. IMPRESSION: 1. Slight improvement in density of RIGHT lower lobe atelectasis versus infiltrate. 2. No change in perihilar opacities representing mild edema or infection. Electronically Signed   By: Suzy Bouchard M.D.   On: 08/15/2015 08:02   Dg Abd Acute W/chest  08/12/2015  CLINICAL DATA:  Episode of nausea and vomiting earlier, no abdominal pain, history of bowel resection and breast malignancy. EXAM: DG ABDOMEN ACUTE W/ 1V CHEST COMPARISON:  Portable chest x-ray of August 11, 2015 and chest CT scan of the same day FINDINGS: The lungs are borderline hypoinflated. Confluent alveolar opacities are present at both lung bases greater on the right than on the left. A trace of pleural fluid on the right is suspected. The cardiac silhouette is mildly enlarged. The central pulmonary vascularity is engorged and indistinct. The patient has undergone previous CABG. The stool burden within the colon is mildly increased. There is a moderate amount of gas within loops of the sigmoid colon. There is no small or large bowel obstructive pattern and no evidence of perforation. There is degenerative change of the lower lumbar spine. There is contrast within the urinary bladder from the earlier CT scan. There  is splenic arterial calcification. IMPRESSION: 1. Patchy airspace opacities bilaterally consistent with pneumonia. A small amount of pleural fluid is likely present on the right. 2. CHF with mild pulmonary vascular congestion. 3. No acute intra-abdominal abnormality is observed. Increased colonic stool burden may reflect constipation in the appropriate clinical setting. Electronically Signed   By: David  Martinique M.D.   On: 08/12/2015 08:16    Oren Binet, MD  [facility] Pager:[phone]  If 7PM-7AM, please contact night-coverage [email or web address] Password [email or web address] 08/19/2015, 10:25 AM   LOS: 8 days

## 2015-08-18 NOTE — Progress Notes (Signed)
Advanced Heart Failure Rounding Note   Subjective:    Says he feels much better than admit but slightly winded this morning. Peeing a lot.  Out 1.3 L and ~ 2 lbs with IV lasix and acetozolamide. Cr trending down.  Objective:   Weight Range: 238 lb (107.956 kg) Body mass index is 36.2 kg/(m^2).   Vital Signs:   Temp:  [98 F (36.7 C)-98.5 F (36.9 C)] 98 F (36.7 C) (11/07 0500) Pulse Rate:  [64-92] 92 (11/07 0500) Resp:  [19-20] 20 (11/07 0500) BP: (122-146)/(65-81) 128/65 mmHg (11/07 0500) SpO2:  [95 %-97 %] 95 % (11/07 0500) Weight:  [238 lb (107.956 kg)] 238 lb (107.956 kg) (11/07 0500) Last BM Date: 08/16/15  Weight change: Filed Weights   08/15/15 0500 08/17/15 0400 08/18/15 0500  Weight: 254 lb 3.2 oz (115.304 kg) 239 lb 14.4 oz (108.818 kg) 238 lb (107.956 kg)    Intake/Output:   Intake/Output Summary (Last 24 hours) at 08/18/15 0847 Last data filed at 08/18/15 0643  Gross per 24 hour  Intake   1080 ml  Output   1625 ml  Net   -545 ml     Physical Exam: General: Elderly and chronically ill appearing. NAD HEENT: normal Neck: supple. JVP 11-12. Carotids 2+ bilat; no bruits. No lymphadenopathy or thyromegaly appreciated. Cor: PMI nondisplaced. Irregularly irregular. No rubs, gallops or murmurs noted Lungs: Slight bibasilar crackles. Normal effort. Abdomen: soft, nontender, nondistended. No hepatosplenomegaly. No bruits or masses. Good bowel sounds. Extremities: no cyanosis, clubbing, rash. UNNA boots present.  Trace edema to knees, 2+ pretibial edema Neuro: alert & orientedx3, cranial nerves grossly intact. moves all 4 extremities w/o difficulty. Affect pleasant   Telemetry: Afib 80-90s  Labs: CBC  Recent Labs  08/17/15 0145  WBC 4.1  HGB 10.0*  HCT 33.6*  MCV 98.5  PLT 326*   Basic Metabolic Panel  Recent Labs  08/17/15 1118 08/18/15 0509  NA 140 138  K 3.7 3.6  CL 99* 100*  CO2 30 30  GLUCOSE 173* 147*  BUN 37* 34*  CREATININE  1.94* 1.76*  CALCIUM 9.2 8.9   Liver Function Tests  Recent Labs  08/17/15 1118 08/18/15 0509  AST 15 15  ALT 9* 10*  ALKPHOS 46 41  BILITOT 0.8 0.7  PROT 6.8 6.6  ALBUMIN 2.7* 2.6*   No results for input(s): LIPASE, AMYLASE in the last 72 hours. Cardiac Enzymes No results for input(s): CKTOTAL, CKMB, CKMBINDEX, TROPONINI in the last 72 hours.  BNP: BNP (last 3 results)  Recent Labs  07/01/15 1627 08/11/15 1616 08/14/15 1416  BNP 331.3* 299.1* 584.6*    ProBNP (last 3 results) No results for input(s): PROBNP in the last 8760 hours.   D-Dimer No results for input(s): DDIMER in the last 72 hours. Hemoglobin A1C No results for input(s): HGBA1C in the last 72 hours. Fasting Lipid Panel No results for input(s): CHOL, HDL, LDLCALC, TRIG, CHOLHDL, LDLDIRECT in the last 72 hours. Thyroid Function Tests No results for input(s): TSH, T4TOTAL, T3FREE, THYROIDAB in the last 72 hours.  Invalid input(s): FREET3  Other results:     Imaging/Studies:  Ct Abdomen Pelvis Wo Contrast  08/17/2015  CLINICAL DATA:  Right upper quadrant abdominal pain and right upper quadrant tendinous on physical examination today. EXAM: CT ABDOMEN AND PELVIS WITHOUT CONTRAST TECHNIQUE: Multidetector CT imaging of the abdomen and pelvis was performed following the standard protocol without IV contrast. COMPARISON:  07/01/2015 FINDINGS: Lower chest: Small bilateral pleural effusions with overlying  atelectasis. The heart is enlarged. Advanced three-vessel coronary artery calcifications and moderate aortic calcifications. The distal esophagus is grossly normal. Hepatobiliary: No focal hepatic lesions or intrahepatic biliary dilatation. The gallbladder is normal. No common bile duct dilatation. Pancreas: Mild diffuse atrophy. No mass, inflammation or ductal dilatation. Spleen: Normal size.  No focal lesions. Adrenals/Urinary Tract: The adrenal glands are normal. Stable bilateral renal cysts. No worrisome  renal lesion, renal calculi or obstructing ureteral calculi. No bladder calculi. Mild uniform bladder wall thickening. Stomach/Bowel: The stomach, duodenum, small bowel and colon are grossly normal without oral contrast. No obvious inflammatory changes, mass lesions or obstructive findings. There is diverticulosis involving the descending and sigmoid colon without definite findings for acute diverticulitis. Moderate stool throughout the colon and down into the rectum may suggest constipation. The terminal ileum is normal. The appendix is normal. Vascular/Lymphatic: No mesenteric or retroperitoneal mass or adenopathy. Stable small scattered lymph nodes. Stable aortic and branch vessel ostial calcifications. No focal aneurysm. Other: Mild uniform bladder wall thickening. The prostate gland and seminal vesicles are unremarkable. No pelvic mass or adenopathy. No free pelvic fluid collections. Small scattered lymph nodes are stable. No inguinal mass or adenopathy. Stable surgical changes involving the anterior abdominal wall. Musculoskeletal: No significant bony findings. Severe degenerative changes involving the spine appears stable. There may be progress of advanced degenerative changes at L4-5 but I do not see any obvious paraspinal process to suggest infection. IMPRESSION: 1. Small bilateral pleural effusions and overlying atelectasis. 2. Advanced three-vessel coronary artery calcifications and moderate atherosclerotic calcification involving the thoracic and abdominal aorta and branch vessels. 3. Numerous bilateral renal cysts appear stable. 4. No acute abdominal/pelvic findings, mass lesions or adenopathy. 5. Mild uniform bladder wall thickening without discrete mass. 6. Severe and progressive degenerative changes in the lower lumbar spine, particularly at L4-5. Electronically Signed   By: Marijo Sanes M.D.   On: 08/17/2015 16:02     Latest Echo  Latest Cath   Medications:     Scheduled Medications: .  acetaZOLAMIDE  500 mg Oral BID  . allopurinol  300 mg Oral Daily  . aspirin EC  325 mg Oral Daily  . atorvastatin  20 mg Oral Daily  . budesonide (PULMICORT) nebulizer solution  0.25 mg Nebulization BID  . carvedilol  6.25 mg Oral BID WC  . colchicine  0.6 mg Oral Daily  . enoxaparin (LOVENOX) injection  40 mg Subcutaneous Q24H  . FLUoxetine  20 mg Oral Daily  . folic acid  1 mg Oral Daily  . furosemide  80 mg Intravenous BID  . gabapentin  300 mg Oral BID  . hydrALAZINE  75 mg Oral 3 times per day  . insulin aspart  0-9 Units Subcutaneous TID WC  . ipratropium-albuterol  3 mL Nebulization BID  . isosorbide dinitrate  10 mg Oral TID  . levothyroxine  187 mcg Oral QAC breakfast  . magnesium oxide  400 mg Oral BID  . pantoprazole  40 mg Oral Daily  . piperacillin-tazobactam (ZOSYN)  IV  3.375 g Intravenous 3 times per day  . potassium chloride SA  40 mEq Oral BID  . sodium chloride  3 mL Intravenous Q12H  . sodium chloride  3 mL Intravenous Q12H  . tamoxifen  20 mg Oral QHS  . thiamine  100 mg Oral Daily  . venlafaxine XR  37.5 mg Oral Daily     Infusions:     PRN Medications:  sodium chloride, sodium chloride, acetaminophen, albuterol, ondansetron (ZOFRAN)  IV, oxyCODONE, senna-docusate, sodium chloride, sodium chloride   Assessment/Plan   1. Acute on chronic diastolic heart failure:  --Significant RV failure on echo 08/16/15. Has responded well to IV lasix and acetazolamide. Weight down below previous baseline. Creatinine trending down.   - Think he still has some volume.  Would like to continue diuresing for at least today.  - Continue current diuretics.  Increase K supp.  2. Chronic atrial fibrillation:  - Rate controlled on Coreg. -No anticoagulation due to ETOH abuse and h/o falls. Continue high dose ASA.  3. CAD/CABG:  - NST in 2015 was negative for ischemia.  - Symptomatically stable from this standpoint.  -Continue ASA, statin and BB.  4. PNA:  -On  Zosyn. 5. Essential HTN: - Improved with increase in Hydralazine. 6. Confusion:  - May be from sundowning vs oxycodone.  - Ammonia level 41 - Alert to person, place, and time this am.  7. RUQ pain: - LFTs normal - No acute findings on CT 08/17/15  Length of Stay: 7   Shirley Friar PA-C 08/18/2015, 8:47 AM  Advanced Heart Failure Team Pager [phone] (M-F; 7a - 4p)  Please contact [facility] for night-coverage after hours (4p -7a ) and weekends on [email or web address]   Patient seen and examined with Oda Kilts, PA-C. We discussed all aspects of the encounter. I agree with the assessment and plan as stated above.   Much improved. Still volume overloaded. Continue IV lasix and acetazolamide one more day.   Novah Nessel,MD 1:52 PM

## 2015-08-19 ENCOUNTER — Encounter: Payer: Self-pay | Admitting: Physician Assistant

## 2015-08-19 LAB — COMPREHENSIVE METABOLIC PANEL
ALK PHOS: 41 U/L (ref 38–126)
ALT: 9 U/L — ABNORMAL LOW (ref 17–63)
AST: 14 U/L — AB (ref 15–41)
Albumin: 2.6 g/dL — ABNORMAL LOW (ref 3.5–5.0)
Anion gap: 11 (ref 5–15)
BILIRUBIN TOTAL: 0.5 mg/dL (ref 0.3–1.2)
BUN: 34 mg/dL — AB (ref 6–20)
CALCIUM: 9 mg/dL (ref 8.9–10.3)
CO2: 28 mmol/L (ref 22–32)
CREATININE: 1.82 mg/dL — AB (ref 0.61–1.24)
Chloride: 99 mmol/L — ABNORMAL LOW (ref 101–111)
GFR calc Af Amer: 41 mL/min — ABNORMAL LOW (ref >60)
GFR, EST NON AFRICAN AMERICAN: 36 mL/min — AB (ref >60)
Glucose, Bld: 147 mg/dL — ABNORMAL HIGH (ref 65–99)
POTASSIUM: 4.2 mmol/L (ref 3.5–5.1)
Sodium: 138 mmol/L (ref 135–145)
TOTAL PROTEIN: 6.4 g/dL — AB (ref 6.5–8.1)

## 2015-08-19 LAB — GLUCOSE, CAPILLARY
GLUCOSE-CAPILLARY: 158 mg/dL — AB (ref 65–99)
GLUCOSE-CAPILLARY: 154 mg/dL — AB (ref 65–99)
Glucose-Capillary: 149 mg/dL — ABNORMAL HIGH (ref 65–99)
Glucose-Capillary: 129 mg/dL — ABNORMAL HIGH (ref 65–99)

## 2015-08-19 MED ORDER — HYDRALAZINE HCL 50 MG PO TABS
100.0000 mg | ORAL_TABLET | Freq: Three times a day (TID) | ORAL | Status: DC
  Administered 2015-08-19 – 2015-08-22 (×9): 100 mg via ORAL
  Filled 2015-08-19 (×9): qty 2

## 2015-08-19 NOTE — Progress Notes (Signed)
Occupational Therapy Treatment Patient Details Name: [name] MRN: [id number] DOB: 1946-08-31 Today's Date: 08/19/2015    History of present illness 69 y.o. male who looks older than stated age has new acute respiratory failure after episode of sepsis from cellulitis and new R frontal punctate infarct.  PHx:  COPD, HTN, PNA, falls   OT comments  Pt progressing. Performed ADLs in session. Continue to recommend SNF.   Follow Up Recommendations  SNF    Equipment Recommendations  Other (comment) (defer to next venue)    Recommendations for Other Services      Precautions / Restrictions Precautions Precautions: Fall Restrictions Weight Bearing Restrictions: No       Mobility Bed Mobility               General bed mobility comments: pt sitting EOB when OT arrived  Transfers Overall transfer level: Needs assistance Equipment used: Rolling walker (2 wheeled) Transfers: Sit to/from Stand Sit to Stand: Max assist;+2 physical assistance (+3 assist-third person moved bed and positioned chair)         General transfer comment: Assist to boost with +2 assist and third person assisted in moving bed and positioning chair.     Balance    +2 assist for standing with RW.                                ADL Overall ADL's : Needs assistance/impaired     Grooming: Oral care;Applying deodorant;Wash/dry face;Sitting Grooming Details (indicate cue type and reason): OT provided setup/supervision for tasks and assisted in moving pt's gown for him to apply deodorant Upper Body Bathing: Set up;Supervision/ safety;Sitting Upper Body Bathing Details (indicate cue type and reason): washed armpits-OT retrieved washcloth as it had fallen in pt's gown             Toilet Transfer: Maximal assistance (+2 physical assist and third person moved bed and chair)           Functional mobility during ADLs:  (+2 physical Max assist for sit to stand and third person  assisted with moving bed and placing chair behind pt).         Vision                     Perception     Praxis      Cognition  Awake/Alert Behavior During Therapy: WFL for tasks assessed/performed Overall Cognitive Status: No family/caregiver present to determine baseline cognitive functioning                       Extremity/Trunk Assessment               Exercises     Shoulder Instructions       General Comments      Pertinent Vitals/ Pain       Pain Assessment: 0-10 Pain Score:  (6-7) Pain Location: bilateral LEs Pain Intervention(s): Monitored during session;Repositioned  Oxygen reading in 70s at times but unsure of accuracy as he was using right hand for activity. Went up to 90s.  Home Living                                          Prior Functioning/Environment  Frequency Min 2X/week     Progress Toward Goals  OT Goals(current goals can now be found in the care plan section)  Progress towards OT goals: Progressing toward goals  Acute Rehab OT Goals Patient Stated Goal: none stated. OT Goal Formulation: Patient unable to participate in goal setting Time For Goal Achievement: 08/28/15 Potential to Achieve Goals: [place] Discharge plan remains appropriate    Co-evaluation    PT/OT/SLP Co-Evaluation/Treatment: Yes (part of session) Reason for Co-Treatment: For patient/therapist safety   OT goals addressed during session: ADL's and self-care;Other (comment) (mobility)      End of Session Equipment Utilized During Treatment: Gait belt;Rolling walker;Oxygen   Activity Tolerance Patient tolerated treatment well   Patient Left in chair;with call bell/phone within reach;with chair alarm set   Nurse Communication Other (comment) (O2 sats-unsure if accurate as he was using right hand)        Time: 6979-4801 (co-treated with PT approximately 10 minutes to transfer to chair) OT Time  Calculation (min): 22 min  Charges: OT General Charges $OT Visit: 1 Procedure OT Treatments $Self Care/Home Management : 8-22 mins  Benito Mccreedy  OTR/L [phone]  08/19/2015, 3:20 PM

## 2015-08-19 NOTE — Progress Notes (Signed)
Physical Therapy Treatment Patient Details Name: [name] MRN: [id number] DOB: [birth year] Today's Date: 08/19/2015    History of Present Illness 69 y.o. male who looks older than stated age has new acute respiratory failure after episode of sepsis from cellulitis and new R frontal punctate infarct.  PHx:  COPD, HTN, PNA, falls    PT Comments    Pt perseverating on having an x-ray of his Lt thigh, bruising noted and pt tender to palpation, RN notified. Pt required +2 mod assist to maintain balance standing 2/2 posterior lean, therefore did not attempt ambulation this session.  +3 assist needed for safe transfer to recliner chair.  Pt will benefit from continued skilled PT services to increase functional independence and safety.   Follow Up Recommendations  SNF     Equipment Recommendations  None recommended by PT    Recommendations for Other Services       Precautions / Restrictions Precautions Precautions: Fall Restrictions Weight Bearing Restrictions: No    Mobility  Bed Mobility Overal bed mobility: Needs Assistance Bed Mobility: Supine to Sit     Supine to sit: Mod assist;HOB elevated     General bed mobility comments: HOB elevated, pt uses bed rail to pull up to sitting.  Increased time for sequencing and requires verbal cues for technique.  Transfers Overall transfer level: Needs assistance Equipment used: Rolling walker (2 wheeled) Transfers: Sit to/from Stand Sit to Stand: Max assist;+2 physical assistance;From elevated surface (+3 assist-third person moved bed and positioned chair)         General transfer comment: Assist to boost with +2 assist and third person assisted in moving bed and positioning chair. Pt pushed up to standing w/ LEs pushing on bed for support and maintains Bil knee extension w/ posterior lean in standing.  Despite verbal cues to allow knees to bend and to lean forward pt is unable to do so and requires mod +2 assist to maintain  upright.  2 trials for sit<>stand  Ambulation/Gait             General Gait Details: deferred as pt unable to maintain balance standing w/o mod +2 assist   Stairs            Wheelchair Mobility    Modified Rankin (Stroke Patients Only)       Balance Overall balance assessment: Needs assistance Sitting-balance support: Bilateral upper extremity supported;Feet supported Sitting balance-Leahy Scale: Fair Sitting balance - Comments: Close min guard 2/2 pt's cognitive impairments and dec safety awareness Postural control: Posterior lean Standing balance support: Bilateral upper extremity supported;During functional activity Standing balance-Leahy Scale: Poor Standing balance comment: Relies on support from RW and +2 mod assist 2/2 posterior lean in standing                    Cognition Arousal/Alertness: Awake/alert;Lethargic Behavior During Therapy: WFL for tasks assessed/performed Overall Cognitive Status: No family/caregiver present to determine baseline cognitive functioning Area of Impairment: Orientation;Attention;Memory;Following commands;Safety/judgement;Problem solving;Awareness Orientation Level: Disoriented to;Time (reports that it is the year 1916) Current Attention Level: Sustained Memory: Decreased short-term memory Following Commands: Follows one step commands inconsistently;Follows multi-step commands with increased time Safety/Judgement: Decreased awareness of safety;Decreased awareness of deficits Awareness: Intellectual Problem Solving: Slow processing;Decreased initiation;Difficulty sequencing;Requires verbal cues;Requires tactile cues      Exercises General Exercises - Lower Extremity Ankle Circles/Pumps: AROM;Both;10 reps;Seated;Limitations Ankle Circles/Pumps Limitations: pain w/ Lt LE Long Arc Quad: AROM;Seated;Both;10 reps    General Comments General comments (skin integrity,  edema, etc.): Had poor body awareness to understand that  in standing he was falling back and that he could not control it.       Pertinent Vitals/Pain Pain Assessment: 0-10 Pain Score: 7  (6-7/10) Pain Location: Lt anterior thigh (tender to palpation) Pain Descriptors / Indicators: Aching Pain Intervention(s): Limited activity within patient's tolerance;Monitored during session    Home Living                      Prior Function            PT Goals (current goals can now be found in the care plan section) Acute Rehab PT Goals Patient Stated Goal: none stated PT Goal Formulation: With patient Time For Goal Achievement: 08/27/15 Potential to Achieve Goals: Fair Progress towards PT goals: Not progressing toward goals - comment (dec ability to participate.  Dec safety w/ mobility.)    Frequency  Min 2X/week    PT Plan Current plan remains appropriate    Co-evaluation   Reason for Co-Treatment: For patient/therapist safety PT goals addressed during session: Mobility/safety with mobility;Balance;Proper use of DME;Strengthening/ROM OT goals addressed during session: ADL's and self-care;Other (comment) (mobility)     End of Session Equipment Utilized During Treatment: Oxygen;Gait belt Activity Tolerance: Patient limited by fatigue;Patient limited by pain Patient left: in chair;with call bell/phone within reach;with chair alarm set;Other (comment) (w/ OT)     Time: 1423-1451 (~8 min overlap w/ OT for transfer to chair) PT Time Calculation (min) (ACUTE ONLY): 28 min  Charges:  $Therapeutic Exercise: 8-22 mins $Therapeutic Activity: 8-22 mins                    G Codes:      Joslyn Hy PT, [place] [phone] Pager: [phone] 08/19/2015, 3:42 PM

## 2015-08-19 NOTE — Progress Notes (Signed)
Advanced Heart Failure Rounding Note   Subjective:    Seems more confused this morning. Mumbling and having difficulty grasping his breakfast utensils, which is normal per the patient. Says he has some discomfort in his stomach.  Denies lightheadedness or headaches.  Output inaccurate due to incontinence but weight shows down 4 lbs with IV lasix and acetozolamide. Cr continues to trend down.  Objective:   Weight Range: 235 lb 4.8 oz (106.731 kg) Body mass index is 35.79 kg/(m^2).   Vital Signs:   Temp:  [98.8 F (37.1 C)-98.9 F (37.2 C)] 98.8 F (37.1 C) (11/08 0406) Pulse Rate:  [80-98] 98 (11/08 0406) Resp:  [18] 18 (11/08 0406) BP: (118-149)/(55-78) 149/68 mmHg (11/08 0406) SpO2:  [95 %-99 %] 95 % (11/08 0406) Weight:  [235 lb 4.8 oz (106.731 kg)] 235 lb 4.8 oz (106.731 kg) (11/08 0406) Last BM Date: 08/18/15  Weight change: Filed Weights   08/17/15 0400 08/18/15 0500 08/19/15 0406  Weight: 239 lb 14.4 oz (108.818 kg) 238 lb (107.956 kg) 235 lb 4.8 oz (106.731 kg)    Intake/Output:   Intake/Output Summary (Last 24 hours) at 08/19/15 0753 Last data filed at 08/19/15 8916  Gross per 24 hour  Intake   1745 ml  Output   2125 ml  Net   -380 ml     Physical Exam: General: Elderly and chronically ill appearing. NAD HEENT: normal Neck: supple. JVP 10. Carotids 2+ bilat; no bruits. No lymphadenopathy or thyromegaly appreciated. Cor: PMI nondisplaced. Irregularly irregular. No M/G/R noted. Lungs: Bibasilar crackles. No resp distress Abdomen: Obese, soft, NT, ND, No HSM. No bruits or masses. Good bowel sounds. Extremities: no cyanosis, clubbing, rash. UNNA boots present.  Trace edema to knees, 1+ pretibial edema, 2+ ankle edema Neuro: alert & orientedx3, cranial nerves grossly intact. moves all 4 extremities w/o difficulty. Affect pleasant  Telemetry: Afib 90s  Labs: CBC  Recent Labs  08/17/15 0145  WBC 4.1  HGB 10.0*  HCT 33.6*  MCV 98.5  PLT 124*   Basic  Metabolic Panel  Recent Labs  08/17/15 1118 08/18/15 0509  NA 140 138  K 3.7 3.6  CL 99* 100*  CO2 30 30  GLUCOSE 173* 147*  BUN 37* 34*  CREATININE 1.94* 1.76*  CALCIUM 9.2 8.9  MG  --  1.6*   Liver Function Tests  Recent Labs  08/17/15 1118 08/18/15 0509  AST 15 15  ALT 9* 10*  ALKPHOS 46 41  BILITOT 0.8 0.7  PROT 6.8 6.6  ALBUMIN 2.7* 2.6*   No results for input(s): LIPASE, AMYLASE in the last 72 hours. Cardiac Enzymes No results for input(s): CKTOTAL, CKMB, CKMBINDEX, TROPONINI in the last 72 hours.  BNP: BNP (last 3 results)  Recent Labs  07/01/15 1627 08/11/15 1616 08/14/15 1416  BNP 331.3* 299.1* 584.6*    ProBNP (last 3 results) No results for input(s): PROBNP in the last 8760 hours.   D-Dimer No results for input(s): DDIMER in the last 72 hours. Hemoglobin A1C No results for input(s): HGBA1C in the last 72 hours. Fasting Lipid Panel No results for input(s): CHOL, HDL, LDLCALC, TRIG, CHOLHDL, LDLDIRECT in the last 72 hours. Thyroid Function Tests No results for input(s): TSH, T4TOTAL, T3FREE, THYROIDAB in the last 72 hours.  Invalid input(s): FREET3  Other results:     Imaging/Studies:  Ct Abdomen Pelvis Wo Contrast  08/17/2015  CLINICAL DATA:  Right upper quadrant abdominal pain and right upper quadrant tendinous on physical examination today. EXAM: CT  ABDOMEN AND PELVIS WITHOUT CONTRAST TECHNIQUE: Multidetector CT imaging of the abdomen and pelvis was performed following the standard protocol without IV contrast. COMPARISON:  07/01/2015 FINDINGS: Lower chest: Small bilateral pleural effusions with overlying atelectasis. The heart is enlarged. Advanced three-vessel coronary artery calcifications and moderate aortic calcifications. The distal esophagus is grossly normal. Hepatobiliary: No focal hepatic lesions or intrahepatic biliary dilatation. The gallbladder is normal. No common bile duct dilatation. Pancreas: Mild diffuse atrophy. No  mass, inflammation or ductal dilatation. Spleen: Normal size.  No focal lesions. Adrenals/Urinary Tract: The adrenal glands are normal. Stable bilateral renal cysts. No worrisome renal lesion, renal calculi or obstructing ureteral calculi. No bladder calculi. Mild uniform bladder wall thickening. Stomach/Bowel: The stomach, duodenum, small bowel and colon are grossly normal without oral contrast. No obvious inflammatory changes, mass lesions or obstructive findings. There is diverticulosis involving the descending and sigmoid colon without definite findings for acute diverticulitis. Moderate stool throughout the colon and down into the rectum may suggest constipation. The terminal ileum is normal. The appendix is normal. Vascular/Lymphatic: No mesenteric or retroperitoneal mass or adenopathy. Stable small scattered lymph nodes. Stable aortic and branch vessel ostial calcifications. No focal aneurysm. Other: Mild uniform bladder wall thickening. The prostate gland and seminal vesicles are unremarkable. No pelvic mass or adenopathy. No free pelvic fluid collections. Small scattered lymph nodes are stable. No inguinal mass or adenopathy. Stable surgical changes involving the anterior abdominal wall. Musculoskeletal: No significant bony findings. Severe degenerative changes involving the spine appears stable. There may be progress of advanced degenerative changes at L4-5 but I do not see any obvious paraspinal process to suggest infection. IMPRESSION: 1. Small bilateral pleural effusions and overlying atelectasis. 2. Advanced three-vessel coronary artery calcifications and moderate atherosclerotic calcification involving the thoracic and abdominal aorta and branch vessels. 3. Numerous bilateral renal cysts appear stable. 4. No acute abdominal/pelvic findings, mass lesions or adenopathy. 5. Mild uniform bladder wall thickening without discrete mass. 6. 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 Electronically Signed   By: Marijo Sanes M.D.   On: 08/17/2015 16:02    Latest Echo  Latest Cath   Medications:     Scheduled Medications: . acetaZOLAMIDE  500 mg Oral BID  . allopurinol  300 mg Oral Daily  . aspirin EC  325 mg Oral Daily  . atorvastatin  20 mg Oral Daily  . budesonide (PULMICORT) nebulizer solution  0.25 mg Nebulization BID  . carvedilol  6.25 mg Oral BID WC  . colchicine  0.6 mg Oral Daily  . enoxaparin (LOVENOX) injection  40 mg Subcutaneous Q24H  . FLUoxetine  20 mg Oral Daily  . folic acid  1 mg Oral Daily  . furosemide  80 mg Intravenous BID  . gabapentin  300 mg Oral BID  . hydrALAZINE  75 mg Oral 3 times per day  . insulin aspart  0-9 Units Subcutaneous TID WC  . ipratropium-albuterol  3 mL Nebulization BID  . isosorbide dinitrate  10 mg Oral TID  . levothyroxine  187 mcg Oral QAC breakfast  . magnesium oxide  400 mg Oral BID  . pantoprazole  40 mg Oral Daily  . potassium chloride SA  40 mEq Oral BID  . sodium chloride  3 mL Intravenous Q12H  . sodium chloride  3 mL Intravenous Q12H  . tamoxifen  20 mg Oral QHS  . thiamine  100 mg Oral Daily  . venlafaxine XR  37.5 mg Oral Daily  Infusions:    PRN Medications: sodium chloride, sodium chloride, acetaminophen, albuterol, ondansetron (ZOFRAN) IV, oxyCODONE, senna-docusate, sodium chloride, sodium chloride   Assessment/Plan   1. Acute on chronic diastolic heart failure: ECHO 08/16/15 LVEF 55-60 with Significant RV failure  - Has responded well to IV lasix and acetazolamide. Weight down below previous baseline.  - Cr stable, Will continue diuretics for today. Likely OK for SNF tomorrow. 2. Chronic atrial fibrillation:  - Rate controlled on Coreg. - No anticoagulation due to ETOH abuse and h/o falls. Continue high dose ASA.  3. CAD/CABG:  - NST in 2015 was negative for ischemia.  - Symptomatically stable from this standpoint.  -Continue ASA, statin and BB.   4. PNA:  -On Zosyn. 5. Essential HTN: - High again today, will increase hydralazine to 100 mg TID 6. Confusion:  - Improved. ? from sundowning vs oxycodone.  - Ammonia level 41 - Alert to person, place, and time this am.  7. RUQ pain: - LFTs normal - No acute findings on CT 08/17/15  Length of Stay: 8  Shirley Friar PA-C 08/19/2015, 7:53 AM  Advanced Heart Failure Team Pager [phone] (M-F; 7a - 4p)  Please contact [facility] for night-coverage after hours (4p -7a ) and weekends on [email or web address]  Patient seen and examined with Oda Kilts, PA-C. We discussed all aspects of the encounter. I agree with the assessment and plan as stated above.   Still volume overloaded. Renal function stable. Continue IV diuresis one more day. Continue acetazolamide.   Bensimhon, Daniel,MD 2:07 PM

## 2015-08-19 NOTE — Progress Notes (Signed)
Patient refuses CPAP tonight.

## 2015-08-20 LAB — COMPREHENSIVE METABOLIC PANEL
ALK PHOS: 43 U/L (ref 38–126)
ALT: 8 U/L — AB (ref 17–63)
AST: 13 U/L — ABNORMAL LOW (ref 15–41)
Albumin: 2.7 g/dL — ABNORMAL LOW (ref 3.5–5.0)
Anion gap: 9 (ref 5–15)
BILIRUBIN TOTAL: 0.4 mg/dL (ref 0.3–1.2)
BUN: 31 mg/dL — ABNORMAL HIGH (ref 6–20)
CALCIUM: 9.1 mg/dL (ref 8.9–10.3)
CO2: 27 mmol/L (ref 22–32)
CREATININE: 1.59 mg/dL — AB (ref 0.61–1.24)
Chloride: 102 mmol/L (ref 101–111)
GFR, EST AFRICAN AMERICAN: 49 mL/min — AB (ref >60)
GFR, EST NON AFRICAN AMERICAN: 42 mL/min — AB (ref >60)
Glucose, Bld: 133 mg/dL — ABNORMAL HIGH (ref 65–99)
Potassium: 3.6 mmol/L (ref 3.5–5.1)
Sodium: 138 mmol/L (ref 135–145)
TOTAL PROTEIN: 6.3 g/dL — AB (ref 6.5–8.1)

## 2015-08-20 LAB — GLUCOSE, CAPILLARY
GLUCOSE-CAPILLARY: 134 mg/dL — AB (ref 65–99)
GLUCOSE-CAPILLARY: 162 mg/dL — AB (ref 65–99)
GLUCOSE-CAPILLARY: 164 mg/dL — AB (ref 65–99)
Glucose-Capillary: 166 mg/dL — ABNORMAL HIGH (ref 65–99)

## 2015-08-20 MED ORDER — FUROSEMIDE 10 MG/ML IJ SOLN
80.0000 mg | Freq: Three times a day (TID) | INTRAMUSCULAR | Status: DC
  Administered 2015-08-20 – 2015-08-21 (×3): 80 mg via INTRAVENOUS
  Filled 2015-08-20 (×3): qty 8

## 2015-08-20 MED ORDER — POTASSIUM CHLORIDE CRYS ER 20 MEQ PO TBCR
40.0000 meq | EXTENDED_RELEASE_TABLET | Freq: Once | ORAL | Status: AC
  Administered 2015-08-20: 40 meq via ORAL
  Filled 2015-08-20: qty 2

## 2015-08-20 NOTE — Progress Notes (Signed)
Pt refuses to wear CPAP. No distress noted. 

## 2015-08-20 NOTE — Progress Notes (Signed)
Advanced Heart Failure Rounding Note   Subjective:    Feels good this morning. Getting scheduled duoneb.  Breathing OK. No complaints.  I/O inaccurate 2/2 incontinence but weight shows down another 2 lbs with IV lasix and acetozolamide. Cr improved   Objective:   Weight Range: 233 lb 8 oz (105.915 kg) Body mass index is 35.51 kg/(m^2).   Vital Signs:   Temp:  [97.9 F (36.6 C)-99.3 F (37.4 C)] 97.9 F (36.6 C) (11/09 0424) Pulse Rate:  [74-95] 78 (11/09 0424) Resp:  [15-18] 15 (11/09 0424) BP: (123-151)/(65-86) 136/67 mmHg (11/09 0424) SpO2:  [93 %-99 %] 98 % (11/09 0424) Weight:  [233 lb 8 oz (105.915 kg)] 233 lb 8 oz (105.915 kg) (11/09 0424) Last BM Date: 08/18/15  Weight change: Filed Weights   08/18/15 0500 08/19/15 0406 08/20/15 0424  Weight: 238 lb (107.956 kg) 235 lb 4.8 oz (106.731 kg) 233 lb 8 oz (105.915 kg)    Intake/Output:   Intake/Output Summary (Last 24 hours) at 08/20/15 0254 Last data filed at 08/20/15 0400  Gross per 24 hour  Intake   1843 ml  Output   1775 ml  Net     68 ml     Physical Exam: General: Elderly and chronically ill appearing. NAD HEENT: normal Neck: supple. JVP 9-10. Carotids 2+ bilat; no bruits. No thyromegaly or nodule noted. Cor: PMI nondisplaced. Irregularly irregular. No M/G/R noted. Lungs: CTA, normal effort. Abdomen: Obese, soft, nontender, nondistended, no HSM. No bruits or masses. Good bowel sounds. Extremities: no cyanosis, clubbing, rash. UNNA boots present.  Trace pretibial edema, 1+ ankle edema Neuro: alert & orientedx3, cranial nerves grossly intact. moves all 4 extremities w/o difficulty. Affect pleasant  Telemetry: Afib 70s  Labs: CBC No results for input(s): WBC, NEUTROABS, HGB, HCT, MCV, PLT in the last 72 hours. Basic Metabolic Panel  Recent Labs  08/18/15 0509 08/19/15 0508 08/20/15 0503  NA 138 138 138  K 3.6 4.2 3.6  CL 100* 99* 102  CO2 30 28 27   GLUCOSE 147* 147* 133*  BUN 34* 34* 31*   CREATININE 1.76* 1.82* 1.59*  CALCIUM 8.9 9.0 9.1  MG 1.6*  --   --    Liver Function Tests  Recent Labs  08/19/15 0508 08/20/15 0503  AST 14* 13*  ALT 9* 8*  ALKPHOS 41 43  BILITOT 0.5 0.4  PROT 6.4* 6.3*  ALBUMIN 2.6* 2.7*   BNP: BNP (last 3 results)  Recent Labs  07/01/15 1627 08/11/15 1616 08/14/15 1416  BNP 331.3* 299.1* 584.6*    Medications:     Scheduled Medications: . acetaZOLAMIDE  500 mg Oral BID  . allopurinol  300 mg Oral Daily  . aspirin EC  325 mg Oral Daily  . atorvastatin  20 mg Oral Daily  . budesonide (PULMICORT) nebulizer solution  0.25 mg Nebulization BID  . carvedilol  6.25 mg Oral BID WC  . colchicine  0.6 mg Oral Daily  . enoxaparin (LOVENOX) injection  40 mg Subcutaneous Q24H  . FLUoxetine  20 mg Oral Daily  . folic acid  1 mg Oral Daily  . furosemide  80 mg Intravenous BID  . gabapentin  300 mg Oral BID  . hydrALAZINE  100 mg Oral 3 times per day  . insulin aspart  0-9 Units Subcutaneous TID WC  . ipratropium-albuterol  3 mL Nebulization BID  . isosorbide dinitrate  10 mg Oral TID  . levothyroxine  187 mcg Oral QAC breakfast  . magnesium oxide  400 mg Oral BID  . pantoprazole  40 mg Oral Daily  . potassium chloride SA  40 mEq Oral BID  . sodium chloride  3 mL Intravenous Q12H  . sodium chloride  3 mL Intravenous Q12H  . tamoxifen  20 mg Oral QHS  . thiamine  100 mg Oral Daily  . venlafaxine XR  37.5 mg Oral Daily    Infusions:    PRN Medications: sodium chloride, sodium chloride, acetaminophen, albuterol, ondansetron (ZOFRAN) IV, oxyCODONE, senna-docusate, sodium chloride, sodium chloride   Assessment/Plan   1. Acute on chronic diastolic heart failure: ECHO 08/16/15 LVEF 55-60 with Significant RV failure  - Has responded well to IV lasix and acetazolamide. Weight down below previous baseline.  - Volume status continues to improve, though still elevated.  Unclear baseline.  He is still responding well to IV lasix and  acetazolamide, and believe continued diuresis is best option at this point. Increase lasix to TID - Cr improved. 2. Chronic atrial fibrillation:  - Rate controlled on Coreg. - No anticoagulation due to ETOH abuse and h/o falls. Continue high dose ASA.  3. CAD/CABG:  - NST in 2015 was negative for ischemia.  - Symptomatically stable from this standpoint.  -Continue ASA, statin and BB.  4. PNA:  -On Zosyn. 5. Essential HTN: - continue hydralazine 100 mg TID 6. Confusion:  - Improved. ? from sundowning vs oxycodone.  - Ammonia level 41 08/17/15 7. RUQ pain: - LFTs normal - No acute findings on CT 08/17/15  Length of Stay: 9  Shirley Friar PA-C 08/20/2015, 8:08 AM  Advanced Heart Failure Team Pager [phone] (M-F; 7a - 4p)  Please contact [facility] for night-coverage after hours (4p -7a ) and weekends on [email or web address]   Patient seen and examined with Oda Kilts, PA-C. We discussed all aspects of the encounter. I agree with the assessment and plan as stated above.   Volume status remains elevated. Renal function stable. Continue IV diuresis. Will need SNF upon d/c.  Cher Egnor,MD 5:56 PM

## 2015-08-20 NOTE — Progress Notes (Signed)
PATIENT DETAILS Name: [name] Age: 69 y.o. Sex: male Date of Birth: [birth year] Admit Date: 08/11/2015 Admitting Physician Rise Patience, MD BPZ:WCHENI,DPOEU, MD  Brief narrative:  69 year old Caucasian male with history of chronic diastolic heart failure, prior history of alcohol abuse, chronic kidney disease stage III, diabetes, CAD status post CABG-admitted for evaluation of syncopal episode after vomiting. Found to have suspected aspiration pneumonia, acute diastolic heart failure causing acute hypoxic respiratory failure. Hospital course complicated by development of worsening renal failure. Cardiology performed [place] on 11/4 which showed severe pulmonary hypertension. See below for further details   Subjective: Continue to exhibit Less short of breath, much more awake and alert.  Assessment/Plan: Principal Problem: Acute respiratory failure with hypoxia: Suspect multifactorial-from probable aspiration pneumonia,  Acute diastolic heart failure, underlying pulmonary hypertension. Improving with Lasix/Diamox-has completed course of IV Abx. Titrate off oxygen as tolerated-follow  Active Problems: ?Syncope/Pre syncope: In a setting of vomiting-? Vagal. Telemetry-shows A. fib-but no other arrhythmias. Recent echocardiogram on 06/12/15 shows EF around 60-65%. EEG negative for seizure-like activity. Continue to monitor in telemetry. Cardiology following   Probable aspiration pneumonia: Improving, remains afebrile and without leukocytosis. Completed 7 days of  Zosyn on 11/7. Blood cultures negative so far, urine streptococcal and Legionella antigen negative  Acute on chronic diastolic heart failure/right sided heart failure: Dry weight appears to be around 244 pounds-significant diuresis with IV Lasix and Diamox-weight now decreased to 233 pounds,neg balance of 10 L- much improved-cardiology recommending continue IV diuresis  Acute on CKD stage 3: Acute renal failure  likely multifactorial in a setting of acute illness/prerenal azotemia/diuretics/possible contrast-induced nephropathy. Suspect CKD likely secondary to underlying diabetic nephropathy. Creatinine now down trending, continue with current dosing of diuretics. Note renal ultrasound negative for hydronephrosis.   Acute encephalopathy:Has steadily improved throughout this hospital course-and now is much more alert and awake t.Suspect intermittent sundowning/delirium in the setting of undiagnosed mild dementia/cognitive dysfunction.Per patient and family last use of EtOH was at least 2-4 months back-therefore do not think has alcohol withdrawal.  Acute CVA: Suspect more of an incidental finding. Has a history of atrial fibrillation-considered a poor candidate for long-term anticoagulation given history of alcohol use and thrombocytopenia.CHA2DS2-VASc score 6.A1c 6.8, LDL 65. Carotid Doppler without significant stenosis.Continue with aspirin and statin.No further recommendations from Neurology  ? Abdominal pain:No tenderness of exam, CT abdomen negative.No further work up required  Anemia/thrombocytopenia: Chronic issue, stable for outpatient work up  Minimally elevated troponins: May be demand ischemia or false positive elevation in the setting of chronic kidney disease. No workup required, continue with aspirin, statin and beta blockers  Atrial fibrillation: See above regarding anticoagulation-rate controlled with Coreg.  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 Chronic small ulceration in the anterior abdominal wall that appears stable  History of right breast cancer: Status post mastectomy, continue tamoxifen.Follows with Dr Lindi Adie  Essential hypertension:controlled, continue Coreg, hydralazine and Imdur. Follow BP   Type 2 diabetes: CBGs stable-with SSI.  A1c 6.8. Previously was on oral agents  that was discontinued in the past because of hypoglycemia-will need to be reassessed on discharge   Hypothyroidism: Continue with Synthroid  COPD: Continue with bronchodilators-this appears stable  Gout: Continue colchicine-appears stable  CAD status post CABG: Continue aspirin, statins and beta blockers  OSA: Related to obesity-continue CPAP  Deconditioning: Suspect underlying failure to thrive  syndrome-Will likely require SNF on discharge   Disposition: Remain suspect SNF when cleared by cardiology  Antimicrobial agents  See below  Anti-infectives    Start     Dose/Rate Route Frequency Ordered Stop   08/18/15 0000  piperacillin-tazobactam (ZOSYN) IVPB 3.375 g     3.375 g 12.5 mL/hr over 240 Minutes Intravenous 3 times per day 08/17/15 2047 08/18/15 2119   08/12/15 1000  vancomycin (VANCOCIN) IVPB 750 mg/150 ml premix  Status:  Discontinued     750 mg 150 mL/hr over 60 Minutes Intravenous Every 12 hours 08/12/15 0215 08/14/15 0906   08/12/15 0230  vancomycin (VANCOCIN) 1,250 mg in sodium chloride 0.9 % 250 mL IVPB     1,250 mg 166.7 mL/hr over 90 Minutes Intravenous  Once 08/12/15 0215 08/12/15 0542   08/12/15 0230  piperacillin-tazobactam (ZOSYN) IVPB 3.375 g  Status:  Discontinued     3.375 g 12.5 mL/hr over 240 Minutes Intravenous 3 times per day 08/12/15 0215 08/17/15 2047      DVT Prophylaxis: Prophylactic Lovenox   Code Status: Full code   Family Communication None at bedisde  Procedures: None  CONSULTS:  cardiology and neurology  Time spent 30 minutes-Greater than 50% of this time was spent in counseling, explanation of diagnosis, planning of further management, and coordination of care.  MEDICATIONS: Scheduled Meds: . acetaZOLAMIDE  500 mg Oral BID  . allopurinol  300 mg Oral Daily  . aspirin EC  325 mg Oral Daily  . atorvastatin  20 mg Oral Daily  . budesonide (PULMICORT) nebulizer solution  0.25 mg Nebulization BID  . carvedilol  6.25 mg Oral  BID WC  . colchicine  0.6 mg Oral Daily  . enoxaparin (LOVENOX) injection  40 mg Subcutaneous Q24H  . FLUoxetine  20 mg Oral Daily  . folic acid  1 mg Oral Daily  . furosemide  80 mg Intravenous TID  . gabapentin  300 mg Oral BID  . hydrALAZINE  100 mg Oral 3 times per day  . insulin aspart  0-9 Units Subcutaneous TID WC  . ipratropium-albuterol  3 mL Nebulization BID  . isosorbide dinitrate  10 mg Oral TID  . levothyroxine  187 mcg Oral QAC breakfast  . magnesium oxide  400 mg Oral BID  . pantoprazole  40 mg Oral Daily  . potassium chloride SA  40 mEq Oral BID  . potassium chloride  40 mEq Oral Once  . sodium chloride  3 mL Intravenous Q12H  . sodium chloride  3 mL Intravenous Q12H  . tamoxifen  20 mg Oral QHS  . thiamine  100 mg Oral Daily  . venlafaxine XR  37.5 mg Oral Daily   Continuous Infusions:  PRN Meds:.sodium chloride, sodium chloride, acetaminophen, albuterol, ondansetron (ZOFRAN) IV, oxyCODONE, senna-docusate, sodium chloride, sodium chloride    PHYSICAL EXAM: Vital signs in last 24 hours: Filed Vitals:   08/20/15 0812 08/20/15 0845 08/20/15 0903 08/20/15 1020  BP:  153/83  120/63  Pulse:  83 83 90  Temp:      TempSrc:      Resp:   17 18  Weight:      SpO2: 91% 93% 91% 92%    Weight change: -0.816 kg (-1 lb 12.8 oz) Filed Weights   08/18/15 0500 08/19/15 0406 08/20/15 0424  Weight: 107.956 kg (238 lb) 106.731 kg (235 lb 4.8 oz) 105.915 kg (233 lb 8 oz)   Body mass index is 35.51 kg/(m^2).   Gen Exam: Awake,alert-speech clear  Neck: Supple, No JVD.   Chest: Few bibasilar rales CVS: S1 S2 irregular, no murmurs.  Abdomen: soft, BS +, mildly tender in mid right abd area , non distended.  Extremities: Unna boots in place Neurologic: Non Focal-but with generalized weakness  Skin: No Rash.  Wounds: N/A.   Intake/Output from previous day:  Intake/Output Summary (Last 24 hours) at 08/20/15 1211 Last data filed at 08/20/15 1132  Gross per 24 hour    Intake    963 ml  Output   2145 ml  Net  -1182 ml     LAB RESULTS: CBC  Recent Labs Lab 08/15/15 0317 08/17/15 0145  WBC 5.5 4.1  HGB 10.8* 10.0*  HCT 35.4* 33.6*  PLT 101* 124*  MCV 99.7 98.5  MCH 30.4 29.3  MCHC 30.5 29.8*  RDW 15.7* 15.5    Chemistries   Recent Labs Lab 08/17/15 0145 08/17/15 1118 08/18/15 0509 08/19/15 0508 08/20/15 0503  NA 138 140 138 138 138  K 3.5 3.7 3.6 4.2 3.6  CL 97* 99* 100* 99* 102  CO2 31 30 30 28 27   GLUCOSE 177* 173* 147* 147* 133*  BUN 40* 37* 34* 34* 31*  CREATININE 2.07* 1.94* 1.76* 1.82* 1.59*  CALCIUM 9.2 9.2 8.9 9.0 9.1  MG  --   --  1.6*  --   --     CBG:  Recent Labs Lab 08/19/15 0709 08/19/15 1113 08/19/15 1625 08/19/15 2144 08/20/15 0725  GLUCAP 158* 149* 129* 154* 134*    GFR Estimated Creatinine Clearance: 50.3 mL/min (by C-G formula based on Cr of 1.59).  Coagulation profile No results for input(s): INR, PROTIME in the last 168 hours.  Cardiac Enzymes No results for input(s): CKMB, TROPONINI, MYOGLOBIN in the last 168 hours.  Invalid input(s): CK  Invalid input(s): POCBNP No results for input(s): DDIMER in the last 72 hours. No results for input(s): HGBA1C in the last 72 hours. No results for input(s): CHOL, HDL, LDLCALC, TRIG, CHOLHDL, LDLDIRECT in the last 72 hours. No results for input(s): TSH, T4TOTAL, T3FREE, THYROIDAB in the last 72 hours.  Invalid input(s): FREET3 No results for input(s): VITAMINB12, FOLATE, FERRITIN, TIBC, IRON, RETICCTPCT in the last 72 hours. No results for input(s): LIPASE, AMYLASE in the last 72 hours.  Urine Studies No results for input(s): UHGB, CRYS in the last 72 hours.  Invalid input(s): UACOL, UAPR, USPG, UPH, UTP, UGL, UKET, UBIL, UNIT, UROB, ULEU, UEPI, UWBC, URBC, UBAC, CAST, UCOM, BILUA  MICROBIOLOGY: Recent Results (from the past 240 hour(s))  MRSA PCR Screening     Status: None   Collection Time: 08/12/15  2:19 AM  Result Value Ref Range Status    MRSA by PCR NEGATIVE NEGATIVE Final    Comment:        The GeneXpert MRSA Assay (FDA approved for NASAL specimens only), is one component of a comprehensive MRSA colonization surveillance program. It is not intended to diagnose MRSA infection nor to guide or monitor treatment for MRSA infections.   Culture, blood (routine x 2) Call MD if unable to obtain prior to antibiotics being given     Status: None   Collection Time: 08/12/15  3:00 AM  Result Value Ref Range Status   Specimen Description BLOOD LEFT HAND  Final   Special Requests BOTTLES DRAWN AEROBIC AND ANAEROBIC 5CC  Final   Culture NO GROWTH 5 DAYS  Final   Report Status 08/17/2015 FINAL  Final  Culture, blood (routine x 2) Call MD if  unable to obtain prior to antibiotics being given     Status: None   Collection Time: 08/12/15  3:08 AM  Result Value Ref Range Status   Specimen Description BLOOD RIGHT HAND  Final   Special Requests IN PEDIATRIC BOTTLE 3CC  Final   Culture NO GROWTH 5 DAYS  Final   Report Status 08/17/2015 FINAL  Final    RADIOLOGY STUDIES/RESULTS: Ct Abdomen Pelvis Wo Contrast  08/17/2015  CLINICAL DATA:  Right upper quadrant abdominal pain and right upper quadrant tendinous on physical examination today. EXAM: CT ABDOMEN AND PELVIS WITHOUT CONTRAST TECHNIQUE: Multidetector CT imaging of the abdomen and pelvis was performed following the standard protocol without IV contrast. COMPARISON:  07/01/2015 FINDINGS: Lower chest: Small bilateral pleural effusions with overlying atelectasis. The heart is enlarged. Advanced three-vessel coronary artery calcifications and moderate aortic calcifications. The distal esophagus is grossly normal. Hepatobiliary: No focal hepatic lesions or intrahepatic biliary dilatation. The gallbladder is normal. No common bile duct dilatation. Pancreas: Mild diffuse atrophy. No mass, inflammation or ductal dilatation. Spleen: Normal size.  No focal lesions. Adrenals/Urinary Tract: The  adrenal glands are normal. Stable bilateral renal cysts. No worrisome renal lesion, renal calculi or obstructing ureteral calculi. No bladder calculi. Mild uniform bladder wall thickening. Stomach/Bowel: The stomach, duodenum, small bowel and colon are grossly normal without oral contrast. No obvious inflammatory changes, mass lesions or obstructive findings. There is diverticulosis involving the descending and sigmoid colon without definite findings for acute diverticulitis. Moderate stool throughout the colon and down into the rectum may suggest constipation. The terminal ileum is normal. The appendix is normal. Vascular/Lymphatic: No mesenteric or retroperitoneal mass or adenopathy. Stable small scattered lymph nodes. Stable aortic and branch vessel ostial calcifications. No focal aneurysm. Other: Mild uniform bladder wall thickening. The prostate gland and seminal vesicles are unremarkable. No pelvic mass or adenopathy. No free pelvic fluid collections. Small scattered lymph nodes are stable. No inguinal mass or adenopathy. Stable surgical changes involving the anterior abdominal wall. Musculoskeletal: No significant bony findings. Severe degenerative changes involving the spine appears stable. There may be progress of advanced degenerative changes at L4-5 but I do not see any obvious paraspinal process to suggest infection. IMPRESSION: 1. Small bilateral pleural effusions and overlying atelectasis. 2. Advanced three-vessel coronary artery calcifications and moderate atherosclerotic calcification involving the thoracic and abdominal aorta and branch vessels. 3. Numerous bilateral renal cysts appear stable. 4. No acute abdominal/pelvic findings, mass lesions or adenopathy. 5. Mild uniform bladder wall thickening without discrete mass. 6. Severe and progressive degenerative changes in the lower lumbar spine, particularly at L4-5. Electronically Signed   By: Marijo Sanes M.D.   On: 08/17/2015 16:02   Dg Chest 2  View  08/11/2015  CLINICAL DATA:  Shortness of breath. Altered mental status. Choked while eating this morning. Possible aspiration. EXAM: CHEST  2 VIEW COMPARISON:  07/04/2015. FINDINGS: Stable enlarged cardiac silhouette and post CABG changes. Interval mild patchy and linear density at the right lung base. Stable prominence of the interstitial markings. Mild increase in prominence of the pulmonary vasculature. Breathing motion blurring on the lateral view. IMPRESSION: 1. Atelectasis at the right lung base. Superimposed aspiration is unlikely based on the current appearance. 2. Stable cardiomegaly and mild chronic interstitial lung disease with interval mild pulmonary vascular congestion. Electronically Signed   By: Claudie Revering M.D.   On: 08/11/2015 17:07   Ct Head Wo Contrast  08/11/2015  CLINICAL DATA:  Initial encounter for 69 YOM walking using his walker  and fell backwards and hit his head today 08/11/15. EXAM: CT HEAD WITHOUT CONTRAST CT CERVICAL SPINE WITHOUT CONTRAST TECHNIQUE: Multidetector CT imaging of the head and cervical spine was performed following the standard protocol without intravenous contrast. Multiplanar CT image reconstructions of the cervical spine were also generated. COMPARISON:  Head CT of 05/26/2015. No prior cervical spine imaging. FINDINGS: CT HEAD FINDINGS Sinuses/Soft tissues: Mild left parietal scalp soft tissue thickening on image/series 67/4. Right maxillary sinus mucous retention cyst or polyp. No skull fracture. Clear mastoid air cells. Intracranial: Cerebral atrophy. Moderate low density in the periventricular white matter likely related to small vessel disease. Left cerebellar remote infarct. No mass lesion, hemorrhage, hydrocephalus, acute infarct, intra-axial, or extra-axial fluid collection. CT CERVICAL SPINE FINDINGS Spinal visualization through the bottom of T4. Prevertebral soft tissues are within normal limits. Dense carotid atherosclerosis bilaterally. No apical  pneumothorax. Advanced spondylosis. Left worse than right neural foraminal narrowing at multiple levels. Mild central canal stenosis, most significant at C3-4. Prior median sternotomy. Maintenance of vertebral body height. Trace C3-4 retrolisthesis. From C7 inferiorly are mildly degraded secondary to patient body habitus. Facets are well-aligned. Coronal reformats demonstrate a normal C1-C2 articulation. IMPRESSION: 1. Mild left parietal scalp soft tissue swelling. No acute intracranial abnormality. 2. Advanced cervical spondylosis. Trace C3-4 is favored to be degenerative. Otherwise, no acute findings in the cervical spine identified. 3.  Cerebral atrophy and small vessel ischemic change. Electronically Signed   By: Abigail Miyamoto M.D.   On: 08/11/2015 16:50   Ct Angio Chest Pe W/cm &/or Wo Cm  08/11/2015  CLINICAL DATA:  Worsening dyspnea.  Altered mental status. EXAM: CT ANGIOGRAPHY CHEST WITH CONTRAST TECHNIQUE: Multidetector CT imaging of the chest was performed using the standard protocol during bolus administration of intravenous contrast. Multiplanar CT image reconstructions and MIPs were obtained to evaluate the vascular anatomy. CONTRAST:  17mL OMNIPAQUE IOHEXOL 350 MG/ML SOLN COMPARISON:  08/28/2014 FINDINGS: Cardiovascular: There is good opacification of the pulmonary arteries. There is no pulmonary embolism. The thoracic aorta is normal in caliber and intact. There is extensive calcified coronary artery plaque. There are multiple coronary artery bypass grafts. Lungs: There are multifocal confluent airspace opacities, central predominant. Considerations include alveolar edema, infectious infiltrate. Hemorrhage or neoplasm less likely but not entirely excluded. Central airways: Patent Effusions: None Lymphadenopathy: Mildly prominent hilar and mediastinal nodes, more likely reactive Esophagus: Unremarkable Upper abdomen: Unremarkable Musculoskeletal: No significant abnormality except for moderate  thoracic degenerative disc disease, kyphosis, and prior sternotomy. Review of the MIP images confirms the above findings. IMPRESSION: 1. Negative for acute pulmonary embolism 2. Confluent airspace opacities bilaterally, right worse than left. Infectious infiltrate is a leading consideration. Alveolar edema less likely but not excluded. There also are mildly prominent hilar and mediastinal nodes which may be reactive. Electronically Signed   By: Andreas Newport M.D.   On: 08/11/2015 23:21   Ct Cervical Spine Wo Contrast  08/11/2015  CLINICAL DATA:  Initial encounter for 69 YOM walking using his walker and fell backwards and hit his head today 08/11/15. EXAM: CT HEAD WITHOUT CONTRAST CT CERVICAL SPINE WITHOUT CONTRAST TECHNIQUE: Multidetector CT imaging of the head and cervical spine was performed following the standard protocol without intravenous contrast. Multiplanar CT image reconstructions of the cervical spine were also generated. COMPARISON:  Head CT of 05/26/2015. No prior cervical spine imaging. FINDINGS: CT HEAD FINDINGS Sinuses/Soft tissues: Mild left parietal scalp soft tissue thickening on image/series 67/4. Right maxillary sinus mucous retention cyst or polyp. No skull  fracture. Clear mastoid air cells. Intracranial: Cerebral atrophy. Moderate low density in the periventricular white matter likely related to small vessel disease. Left cerebellar remote infarct. No mass lesion, hemorrhage, hydrocephalus, acute infarct, intra-axial, or extra-axial fluid collection. CT CERVICAL SPINE FINDINGS Spinal visualization through the bottom of T4. Prevertebral soft tissues are within normal limits. Dense carotid atherosclerosis bilaterally. No apical pneumothorax. Advanced spondylosis. Left worse than right neural foraminal narrowing at multiple levels. Mild central canal stenosis, most significant at C3-4. Prior median sternotomy. Maintenance of vertebral body height. Trace C3-4 retrolisthesis. From C7  inferiorly are mildly degraded secondary to patient body habitus. Facets are well-aligned. Coronal reformats demonstrate a normal C1-C2 articulation. IMPRESSION: 1. Mild left parietal scalp soft tissue swelling. No acute intracranial abnormality. 2. Advanced cervical spondylosis. Trace C3-4 is favored to be degenerative. Otherwise, no acute findings in the cervical spine identified. 3.  Cerebral atrophy and small vessel ischemic change. Electronically Signed   By: Abigail Miyamoto M.D.   On: 08/11/2015 16:50   Mr Brain Wo Contrast  08/12/2015  CLINICAL DATA:  Initial evaluation for syncope. EXAM: MRI HEAD WITHOUT CONTRAST TECHNIQUE: Multiplanar, multiecho pulse sequences of the brain and surrounding structures were obtained without intravenous contrast. COMPARISON:  Prior CT from on 08/11/2015. FINDINGS: Diffuse prominence of the CSF containing spaces is compatible with generalized age-related cerebral atrophy. Patchy T2/FLAIR hyperintensity within the periventricular and deep white matter both cerebral hemispheres most likely related chronic small vessel ischemic disease. Encephalomalacia within the left cerebellar hemisphere consistent with remote infarct. Associated chronic hemosiderin staining present within this region. Probable remote infarct within the anteromedial left frontal lobe as well. There is a possible tiny punctate 4 mm focus of high signal intensity on DWI sequence involving the cortical gray matter of the posterior right frontal region (series 3, image 31 on axial sequence, series 5, image 14 on coronal sequence). Finding may reflect a tiny acute ischemic infarct. Corresponding signal loss not definitely seen on ADC map, although this is felt to be too small to be resolved. No other definite acute intracranial infarct. Abnormal flow void within the distal left vertebral artery, which may related to focal plaque or possibly chronic dissection (series 8, image 3), similar to prior. Major intracranial  vascular flow voids otherwise maintained. The right vertebral artery is diminutive. No acute intracranial hemorrhage. No mass lesion, midline shift, or mass effect. No hydrocephalus. No extra-axial fluid collection. Craniocervical junction grossly within normal limits. Degenerative spondylolysis noted within the partially visualized upper cervical spine. Pituitary gland grossly normal. No acute abnormality about the orbits. Sequela prior lens extraction present on the left. Mild mucosal thickening within the maxillary sinuses and ethmoidal air cells. Paranasal sinuses are otherwise clear. Minimal opacity within the right mastoid air cells. Inner ear structures normal. Bone marrow signal intensity within normal limits. No scalp soft tissue abnormality. IMPRESSION: 1. Question 4 mm punctate focus of high signal intensity on DWI sequence within the posterior right frontal lobe. Finding favored to reflect artifact, although possible tiny acute ischemic infarct is not entirely excluded, and could be considered in the correct clinical setting. 2. No other acute intracranial process. 3. 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 4. Abnormal flow void within the distal left vertebral artery, which may be related to focal plaque or possibly chronic dissection. This is similar relative to prior study. 5. Age-related cerebral atrophy with mild to moderate chronic small vessel ischemic disease. Electronically Signed   By: Pincus Badder.D.  On: 08/12/2015 02:40   US Renal  08/14/2015  CLINICAL DATA:  69 year old male with acute renal failure EXAM: RENAL / URINARY TRACT ULTRASOUND COMPLETE COMPARISON:  CT scan of the abdomen and pelvis 07/01/2015 FINDINGS: Right Kidney: Length: 12.2 cm. Technically challenging examination secondary to patient body habitus and diffuse edema. The right kidney is very poorly seen. No obvious hydronephrosis. Left Kidney: Length: 12.3 cm.  Poorly seen. No hydronephrosis. 4.3 cm anechoic simple cyst in the interpolar region. Bladder: Not visualized. IMPRESSION: 1. Technically limited study secondary to patient body habitus and diffuse soft tissue edema. 2. The right kidney is very poorly seen.  No obvious hydronephrosis. 3. No left-sided hydronephrosis. 4. Simple renal cysts on the left. Electronically Signed   By: Jacqulynn Cadet M.D.   On: 08/14/2015 21:25   Dg Chest Port 1 View  08/15/2015  CLINICAL DATA:  Short of breath, COPD, asthma EXAM: PORTABLE CHEST 1 VIEW COMPARISON:  Radiograph 08/12/2015 FINDINGS: Sternotomy wires overlie normal cardiac silhouette. Linear opacity at the RIGHT lung base again demonstrated slightly improved. Patchy perihilar airspace disease again demonstrated. No pneumothorax. IMPRESSION: 1. Slight improvement in density of RIGHT lower lobe atelectasis versus infiltrate. 2. No change in perihilar opacities representing mild edema or infection. Electronically Signed   By: Suzy Bouchard M.D.   On: 08/15/2015 08:02   Dg Abd Acute W/chest  08/12/2015  CLINICAL DATA:  Episode of nausea and vomiting earlier, no abdominal pain, history of bowel resection and breast malignancy. EXAM: DG ABDOMEN ACUTE W/ 1V CHEST COMPARISON:  Portable chest x-ray of August 11, 2015 and chest CT scan of the same day FINDINGS: The lungs are borderline hypoinflated. Confluent alveolar opacities are present at both lung bases greater on the right than on the left. A trace of pleural fluid on the right is suspected. The cardiac silhouette is mildly enlarged. The central pulmonary vascularity is engorged and indistinct. The patient has undergone previous CABG. The stool burden within the colon is mildly increased. There is a moderate amount of gas within loops of the sigmoid colon. There is no small or large bowel obstructive pattern and no evidence of perforation. There is degenerative change of the lower lumbar spine. There is contrast  within the urinary bladder from the earlier CT scan. There is splenic arterial calcification. IMPRESSION: 1. Patchy airspace opacities bilaterally consistent with pneumonia. A small amount of pleural fluid is likely present on the right. 2. CHF with mild pulmonary vascular congestion. 3. No acute intra-abdominal abnormality is observed. Increased colonic stool burden may reflect constipation in the appropriate clinical setting. Electronically Signed   By: David  Martinique M.D.   On: 08/12/2015 08:16    Oren Binet, MD  [facility] Pager:[phone]  If 7PM-7AM, please contact night-coverage [email or web address] Password [email or web address] 08/20/2015, 12:11 PM   LOS: 9 days

## 2015-08-21 LAB — COMPREHENSIVE METABOLIC PANEL
ALBUMIN: 2.8 g/dL — AB (ref 3.5–5.0)
ALK PHOS: 46 U/L (ref 38–126)
ALT: 8 U/L — AB (ref 17–63)
ANION GAP: 7 (ref 5–15)
AST: 14 U/L — ABNORMAL LOW (ref 15–41)
BUN: 31 mg/dL — ABNORMAL HIGH (ref 6–20)
CALCIUM: 9.5 mg/dL (ref 8.9–10.3)
CHLORIDE: 104 mmol/L (ref 101–111)
CO2: 26 mmol/L (ref 22–32)
CREATININE: 1.41 mg/dL — AB (ref 0.61–1.24)
GFR calc Af Amer: 56 mL/min — ABNORMAL LOW (ref >60)
GFR calc non Af Amer: 49 mL/min — ABNORMAL LOW (ref >60)
GLUCOSE: 151 mg/dL — AB (ref 65–99)
Potassium: 4 mmol/L (ref 3.5–5.1)
SODIUM: 137 mmol/L (ref 135–145)
Total Bilirubin: 0.4 mg/dL (ref 0.3–1.2)
Total Protein: 7.2 g/dL (ref 6.5–8.1)

## 2015-08-21 LAB — GLUCOSE, CAPILLARY
GLUCOSE-CAPILLARY: 200 mg/dL — AB (ref 65–99)
Glucose-Capillary: 133 mg/dL — ABNORMAL HIGH (ref 65–99)
Glucose-Capillary: 126 mg/dL — ABNORMAL HIGH (ref 65–99)
Glucose-Capillary: 160 mg/dL — ABNORMAL HIGH (ref 65–99)

## 2015-08-21 MED ORDER — POTASSIUM CHLORIDE CRYS ER 20 MEQ PO TBCR
20.0000 meq | EXTENDED_RELEASE_TABLET | Freq: Two times a day (BID) | ORAL | Status: DC
  Administered 2015-08-21 – 2015-08-22 (×2): 20 meq via ORAL
  Filled 2015-08-21 (×2): qty 1

## 2015-08-21 MED ORDER — FUROSEMIDE 10 MG/ML IJ SOLN: 80.0000 mg | Freq: Two times a day (BID) | INTRAMUSCULAR | Status: DC

## 2015-08-21 MED ORDER — FUROSEMIDE 10 MG/ML IJ SOLN
80.0000 mg | Freq: Three times a day (TID) | INTRAMUSCULAR | Status: DC
  Administered 2015-08-21 – 2015-08-22 (×3): 80 mg via INTRAVENOUS
  Filled 2015-08-21 (×3): qty 8

## 2015-08-21 NOTE — Progress Notes (Signed)
Advanced Heart Failure Rounding Note   Subjective:    Feels good today.  Less confused than he has been.  Knows where he is, remembers me, and is up to date on the current election.    Out 2.1 L + incontinence yesterday.  Weight shows down 9 lbs (?) with IV lasix and acetozolamide. Will recheck standing weight. Cr improved   Objective:   Weight Range: 224 lb 6.4 oz (101.787 kg) Body mass index is 34.13 kg/(m^2).   Vital Signs:   Temp:  [97.6 F (36.4 C)-98.4 F (36.9 C)] 97.6 F (36.4 C) (11/10 0500) Pulse Rate:  [69-90] 82 (11/10 0500) Resp:  [15-22] 19 (11/10 0500) BP: (110-153)/(54-83) 133/71 mmHg (11/10 0657) SpO2:  [91 %-95 %] 95 % (11/10 0500) Weight:  [224 lb 6.4 oz (101.787 kg)] 224 lb 6.4 oz (101.787 kg) (11/10 0500) Last BM Date: 08/18/15  Weight change: Filed Weights   08/19/15 0406 08/20/15 0424 08/21/15 0500  Weight: 235 lb 4.8 oz (106.731 kg) 233 lb 8 oz (105.915 kg) 224 lb 6.4 oz (101.787 kg)    Intake/Output:   Intake/Output Summary (Last 24 hours) at 08/21/15 0746 Last data filed at 08/21/15 0700  Gross per 24 hour  Intake   1090 ml  Output   3970 ml  Net  -2880 ml     Physical Exam: General: Elderly and chronically ill appearing. NAD HEENT: normal Neck: supple. JVP 8. Carotids 2+ bilat; no bruits. No thyromegaly or nodule noted. Cor: PMI nondisplaced. Irregularly irregular. No M/G/R noted. Lungs: CTA, normal effort. Abdomen: Obese, soft, NT, ND, no hepatosplenomegaly. No bruits or masses. Good bowel sounds. Extremities: no cyanosis, clubbing, rash. UNNA boots present.  Minimal pretibial edema, trace-1+ ankle edema Neuro: alert & orientedx3, cranial nerves grossly intact. moves all 4 extremities w/o difficulty. Affect pleasant  Telemetry: Afib 80s  Labs: CBC No results for input(s): WBC, NEUTROABS, HGB, HCT, MCV, PLT in the last 72 hours. Basic Metabolic Panel  Recent Labs  08/20/15 0503 08/21/15 0422  NA 138 137  K 3.6 4.0  CL 102  104  CO2 27 26  GLUCOSE 133* 151*  BUN 31* 31*  CREATININE 1.59* 1.41*  CALCIUM 9.1 9.5   Liver Function Tests  Recent Labs  08/20/15 0503 08/21/15 0422  AST 13* 14*  ALT 8* 8*  ALKPHOS 43 46  BILITOT 0.4 0.4  PROT 6.3* 7.2  ALBUMIN 2.7* 2.8*   BNP: BNP (last 3 results)  Recent Labs  07/01/15 1627 08/11/15 1616 08/14/15 1416  BNP 331.3* 299.1* 584.6*    Medications:     Scheduled Medications: . acetaZOLAMIDE  500 mg Oral BID  . allopurinol  300 mg Oral Daily  . aspirin EC  325 mg Oral Daily  . atorvastatin  20 mg Oral Daily  . budesonide (PULMICORT) nebulizer solution  0.25 mg Nebulization BID  . carvedilol  6.25 mg Oral BID WC  . colchicine  0.6 mg Oral Daily  . enoxaparin (LOVENOX) injection  40 mg Subcutaneous Q24H  . FLUoxetine  20 mg Oral Daily  . folic acid  1 mg Oral Daily  . furosemide  80 mg Intravenous TID  . gabapentin  300 mg Oral BID  . hydrALAZINE  100 mg Oral 3 times per day  . insulin aspart  0-9 Units Subcutaneous TID WC  . ipratropium-albuterol  3 mL Nebulization BID  . isosorbide dinitrate  10 mg Oral TID  . levothyroxine  187 mcg Oral QAC breakfast  .  magnesium oxide  400 mg Oral BID  . pantoprazole  40 mg Oral Daily  . potassium chloride SA  40 mEq Oral BID  . sodium chloride  3 mL Intravenous Q12H  . sodium chloride  3 mL Intravenous Q12H  . tamoxifen  20 mg Oral QHS  . thiamine  100 mg Oral Daily  . venlafaxine XR  37.5 mg Oral Daily    Infusions:    PRN Medications: sodium chloride, sodium chloride, acetaminophen, albuterol, ondansetron (ZOFRAN) IV, oxyCODONE, senna-docusate, sodium chloride, sodium chloride   Assessment/Plan   1. Acute on chronic diastolic heart failure: ECHO 08/16/15 LVEF 55-60 with Significant RV failure  - Volume status continues to improve, still only slightly up.  Think best option would be to continue IV diuresis one more day, and send to SNF in am. Want to be as dry as possible, and he is still  responding to IV lasix and acetazolamide. Cr continues to improve.   - Continue IV lasix 80 mg TID  Continue diamox. 2. Chronic atrial fibrillation:  - Rate controlled on Coreg. - No anticoagulation due to ETOH abuse and h/o falls. Continue high dose ASA.  3. CAD/CABG:  - NST in 2015 was negative for ischemia.  - Symptomatically stable from this standpoint.  -Continue ASA, statin and BB.  4. PNA:  -On Zosyn. 5. Essential HTN: - continue hydralazine 100 mg TID 6. Confusion:  - Much improved. ? from sundowning vs oxycodone.  - Ammonia level 41 08/17/15 7. RUQ pain: - LFTs normal - No acute findings on CT 08/17/15  Length of Stay: [place] PA-C 08/21/2015, 7:46 AM  Advanced Heart Failure Team Pager [phone] (M-F; 7a - 4p)  Please contact [facility] for night-coverage after hours (4p -7a ) and weekends on [email or web address]  Patient seen and examined with Oda Kilts, PA-C. We discussed all aspects of the encounter. I agree with the assessment and plan as stated above.   He continues to diurese well. Still volume overload. Renal function stable. Continue IV lasix.   Vassie Kugel,MD 4:30 PM

## 2015-08-21 NOTE — Progress Notes (Signed)
PATIENT DETAILS Name: [name] Age: 69 y.o. Sex: male Date of Birth: 1945/12/30 Admit Date: 08/11/2015 Admitting Physician Rise Patience, MD BK:[id number], MD  Brief narrative:  69 year old Caucasian male with history of chronic diastolic heart failure, prior history of alcohol abuse, chronic kidney disease stage III, diabetes, CAD status post CABG-admitted for evaluation of syncopal episode after vomiting. Found to have suspected aspiration pneumonia, acute diastolic heart failure causing acute hypoxic respiratory failure. Hospital course complicated by development of worsening renal failure. Cardiology performed [place] on 11/4 which showed severe pulmonary hypertension. See below for further details   Subjective: No major issues overnight-improving-continues to be awake and alert.  Assessment/Plan: Principal Problem: Acute respiratory failure with hypoxia: Suspect multifactorial-from probable aspiration pneumonia,  Acute diastolic heart failure, underlying pulmonary hypertension. Improving with Lasix/Diamox-has completed course of IV Abx. Titrate off oxygen as tolerated-follow  Active Problems: ?Syncope/Pre syncope: In a setting of vomiting-? Vagal. Telemetry-shows A. fib-but no other arrhythmias. Recent echocardiogram on 06/12/15 shows EF around 60-65%. EEG negative for seizure-like activity. Continue to monitor in telemetry. Cardiology following   Probable aspiration pneumonia: Improved, remains afebrile and without leukocytosis. Completed 7 days of  Zosyn on 11/7. Blood cultures negative so far, urine streptococcal and Legionella antigen negative  Acute on chronic diastolic heart failure/right sided heart failure: Much more compensated-still with mild volume on board-Dry weight appears to be around 244 pounds-significant diuresis with IV Lasix and Diamox-weight now decreased to 224 pounds (253 pounds on admission),neg balance of 11.8 L-cardiology recommending  continue IV diuresis  Acute on CKD stage 3: Acute renal failure likely multifactorial in a setting of acute illness/prerenal azotemia/diuretics/possible contrast-induced nephropathy. Suspect CKD likely secondary to underlying diabetic nephropathy. Creatinine now down trending, continue with current dosing of diuretics. Note renal ultrasound negative for hydronephrosis.   Acute encephalopathy:Has steadily improved throughout this hospital course-and now is much more alert and awake t.Suspect intermittent sundowning/delirium in the setting of undiagnosed mild dementia/cognitive dysfunction.Per patient and family last use of EtOH was at least 2-4 months back-therefore do not think has alcohol withdrawal.  Acute CVA: Suspect more of an incidental finding. Has a history of atrial fibrillation-considered a poor candidate for long-term anticoagulation given history of alcohol use and thrombocytopenia.CHA2DS2-VASc score 6.A1c 6.8, LDL 65. Carotid Doppler without significant stenosis.Continue with aspirin and statin.No further recommendations from Neurology  ? Abdominal pain:No tenderness of exam, CT abdomen negative.No further work up required  Anemia/thrombocytopenia: Chronic issue, stable for outpatient work up  Minimally elevated troponins: May be demand ischemia or false positive elevation in the setting of chronic kidney disease. No workup required, continue with aspirin, statin and beta blockers  Atrial fibrillation: See above regarding anticoagulation-rate controlled with Coreg.  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 Chronic small ulceration in the anterior abdominal wall that appears stable  History of right breast cancer: Status post mastectomy, continue tamoxifen.Follows with Dr Lindi Adie  Essential hypertension:controlled, continue Coreg, hydralazine and Imdur. Follow BP   Type 2  diabetes: CBGs stable-with SSI.  A1c 6.8. Previously was on oral agents that was discontinued in the past because of hypoglycemia-will need to be reassessed on discharge   Hypothyroidism: Continue with Synthroid  COPD: Continue with bronchodilators-this appears stable  Gout: Continue colchicine and allopurinol-appears stable  CAD status post CABG: Continue aspirin, statins and beta blockers  OSA: Related to obesity-continue  CPAP  Deconditioning: Suspect underlying failure to thrive syndrome-Will likely require SNF on discharge   Disposition: Remain suspect SNF when cleared by cardiology  Antimicrobial agents  See below  Anti-infectives    Start     Dose/Rate Route Frequency Ordered Stop   08/18/15 0000  piperacillin-tazobactam (ZOSYN) IVPB 3.375 g     3.375 g 12.5 mL/hr over 240 Minutes Intravenous 3 times per day 08/17/15 2047 08/18/15 2119   08/12/15 1000  vancomycin (VANCOCIN) IVPB 750 mg/150 ml premix  Status:  Discontinued     750 mg 150 mL/hr over 60 Minutes Intravenous Every 12 hours 08/12/15 0215 08/14/15 0906   08/12/15 0230  vancomycin (VANCOCIN) 1,250 mg in sodium chloride 0.9 % 250 mL IVPB     1,250 mg 166.7 mL/hr over 90 Minutes Intravenous  Once 08/12/15 0215 08/12/15 0542   08/12/15 0230  piperacillin-tazobactam (ZOSYN) IVPB 3.375 g  Status:  Discontinued     3.375 g 12.5 mL/hr over 240 Minutes Intravenous 3 times per day 08/12/15 0215 08/17/15 2047      DVT Prophylaxis: Prophylactic Lovenox   Code Status: Full code   Family Communication None at bedisde  Procedures: None  CONSULTS:  cardiology and neurology  Time spent 30 minutes-Greater than 50% of this time was spent in counseling, explanation of diagnosis, planning of further management, and coordination of care.  MEDICATIONS: Scheduled Meds: . acetaZOLAMIDE  500 mg Oral BID  . allopurinol  300 mg Oral Daily  . aspirin EC  325 mg Oral Daily  . atorvastatin  20 mg Oral Daily  .  budesonide (PULMICORT) nebulizer solution  0.25 mg Nebulization BID  . carvedilol  6.25 mg Oral BID WC  . colchicine  0.6 mg Oral Daily  . enoxaparin (LOVENOX) injection  40 mg Subcutaneous Q24H  . FLUoxetine  20 mg Oral Daily  . folic acid  1 mg Oral Daily  . furosemide  80 mg Intravenous BID  . gabapentin  300 mg Oral BID  . hydrALAZINE  100 mg Oral 3 times per day  . insulin aspart  0-9 Units Subcutaneous TID WC  . ipratropium-albuterol  3 mL Nebulization BID  . isosorbide dinitrate  10 mg Oral TID  . levothyroxine  187 mcg Oral QAC breakfast  . magnesium oxide  400 mg Oral BID  . pantoprazole  40 mg Oral Daily  . potassium chloride SA  20 mEq Oral BID  . sodium chloride  3 mL Intravenous Q12H  . sodium chloride  3 mL Intravenous Q12H  . tamoxifen  20 mg Oral QHS  . thiamine  100 mg Oral Daily  . venlafaxine XR  37.5 mg Oral Daily   Continuous Infusions:  PRN Meds:.sodium chloride, sodium chloride, acetaminophen, albuterol, ondansetron (ZOFRAN) IV, oxyCODONE, senna-docusate, sodium chloride, sodium chloride    PHYSICAL EXAM: Vital signs in last 24 hours: Filed Vitals:   08/21/15 0500 08/21/15 0657 08/21/15 0801 08/21/15 0832  BP: 133/71 133/71    Pulse: 82   70  Temp: 97.6 F (36.4 C)     TempSrc: Axillary     Resp: 19   22  Weight: 101.787 kg (224 lb 6.4 oz)     SpO2: 95%  100% 90%    Weight change: -4.128 kg (-9 lb 1.6 oz) Filed Weights   08/19/15 0406 08/20/15 0424 08/21/15 0500  Weight: 106.731 kg (235 lb 4.8 oz) 105.915 kg (233 lb 8 oz) 101.787 kg (224 lb 6.4 oz)   Body mass index  is 34.13 kg/(m^2).   Gen Exam: Awake,alert-speech clear Neck: Supple, No JVD.   Chest: Few bibasilar rales CVS: S1 S2 irregular, no murmurs.  Abdomen: soft, BS +, mildly tender in mid right abd area , non distended.  Extremities: Unna boots in place Neurologic: Non Focal-but with generalized weakness  Skin: No Rash.  Wounds: N/A.   Intake/Output from previous  day:  Intake/Output Summary (Last 24 hours) at 08/21/15 0949 Last data filed at 08/21/15 0800  Gross per 24 hour  Intake   1430 ml  Output   3595 ml  Net  -2165 ml     LAB RESULTS: CBC  Recent Labs Lab 08/15/15 0317 08/17/15 0145  WBC 5.5 4.1  HGB 10.8* 10.0*  HCT 35.4* 33.6*  PLT 101* 124*  MCV 99.7 98.5  MCH 30.4 29.3  MCHC 30.5 29.8*  RDW 15.7* 15.5    Chemistries   Recent Labs Lab 08/17/15 1118 08/18/15 0509 08/19/15 0508 08/20/15 0503 08/21/15 0422  NA 140 138 138 138 137  K 3.7 3.6 4.2 3.6 4.0  CL 99* 100* 99* 102 104  CO2 30 30 28 27 26   GLUCOSE 173* 147* 147* 133* 151*  BUN 37* 34* 34* 31* 31*  CREATININE 1.94* 1.76* 1.82* 1.59* 1.41*  CALCIUM 9.2 8.9 9.0 9.1 9.5  MG  --  1.6*  --   --   --     CBG:  Recent Labs Lab 08/20/15 0725 08/20/15 1123 08/20/15 1628 08/20/15 2111 08/21/15 0730  GLUCAP 134* 162* 164* 166* 133*    GFR Estimated Creatinine Clearance: 55.6 mL/min (by C-G formula based on Cr of 1.41).  Coagulation profile No results for input(s): INR, PROTIME in the last 168 hours.  Cardiac Enzymes No results for input(s): CKMB, TROPONINI, MYOGLOBIN in the last 168 hours.  Invalid input(s): CK  Invalid input(s): POCBNP No results for input(s): DDIMER in the last 72 hours. No results for input(s): HGBA1C in the last 72 hours. No results for input(s): CHOL, HDL, LDLCALC, TRIG, CHOLHDL, LDLDIRECT in the last 72 hours. No results for input(s): TSH, T4TOTAL, T3FREE, THYROIDAB in the last 72 hours.  Invalid input(s): FREET3 No results for input(s): VITAMINB12, FOLATE, FERRITIN, TIBC, IRON, RETICCTPCT in the last 72 hours. No results for input(s): LIPASE, AMYLASE in the last 72 hours.  Urine Studies No results for input(s): UHGB, CRYS in the last 72 hours.  Invalid input(s): UACOL, UAPR, USPG, UPH, UTP, UGL, UKET, UBIL, UNIT, UROB, ULEU, UEPI, UWBC, URBC, UBAC, CAST, UCOM, BILUA  MICROBIOLOGY: Recent Results (from the past 240  hour(s))  MRSA PCR Screening     Status: None   Collection Time: 08/12/15  2:19 AM  Result Value Ref Range Status   MRSA by PCR NEGATIVE NEGATIVE Final    Comment:        The GeneXpert MRSA Assay (FDA approved for NASAL specimens only), is one component of a comprehensive MRSA colonization surveillance program. It is not intended to diagnose MRSA infection nor to guide or monitor treatment for MRSA infections.   Culture, blood (routine x 2) Call MD if unable to obtain prior to antibiotics being given     Status: None   Collection Time: 08/12/15  3:00 AM  Result Value Ref Range Status   Specimen Description BLOOD LEFT HAND  Final   Special Requests BOTTLES DRAWN AEROBIC AND ANAEROBIC 5CC  Final   Culture NO GROWTH 5 DAYS  Final   Report Status 08/17/2015 FINAL  Final  Culture,  blood (routine x 2) Call MD if unable to obtain prior to antibiotics being given     Status: None   Collection Time: 08/12/15  3:08 AM  Result Value Ref Range Status   Specimen Description BLOOD RIGHT HAND  Final   Special Requests IN PEDIATRIC BOTTLE 3CC  Final   Culture NO GROWTH 5 DAYS  Final   Report Status 08/17/2015 FINAL  Final    RADIOLOGY STUDIES/RESULTS: Ct Abdomen Pelvis Wo Contrast  08/17/2015  CLINICAL DATA:  Right upper quadrant abdominal pain and right upper quadrant tendinous on physical examination today. EXAM: CT ABDOMEN AND PELVIS WITHOUT CONTRAST TECHNIQUE: Multidetector CT imaging of the abdomen and pelvis was performed following the standard protocol without IV contrast. COMPARISON:  07/01/2015 FINDINGS: Lower chest: Small bilateral pleural effusions with overlying atelectasis. The heart is enlarged. Advanced three-vessel coronary artery calcifications and moderate aortic calcifications. The distal esophagus is grossly normal. Hepatobiliary: No focal hepatic lesions or intrahepatic biliary dilatation. The gallbladder is normal. No common bile duct dilatation. Pancreas: Mild diffuse  atrophy. No mass, inflammation or ductal dilatation. Spleen: Normal size.  No focal lesions. Adrenals/Urinary Tract: The adrenal glands are normal. Stable bilateral renal cysts. No worrisome renal lesion, renal calculi or obstructing ureteral calculi. No bladder calculi. Mild uniform bladder wall thickening. Stomach/Bowel: The stomach, duodenum, small bowel and colon are grossly normal without oral contrast. No obvious inflammatory changes, mass lesions or obstructive findings. There is diverticulosis involving the descending and sigmoid colon without definite findings for acute diverticulitis. Moderate stool throughout the colon and down into the rectum may suggest constipation. The terminal ileum is normal. The appendix is normal. Vascular/Lymphatic: No mesenteric or retroperitoneal mass or adenopathy. Stable small scattered lymph nodes. Stable aortic and branch vessel ostial calcifications. No focal aneurysm. Other: Mild uniform bladder wall thickening. The prostate gland and seminal vesicles are unremarkable. No pelvic mass or adenopathy. No free pelvic fluid collections. Small scattered lymph nodes are stable. No inguinal mass or adenopathy. Stable surgical changes involving the anterior abdominal wall. Musculoskeletal: No significant bony findings. Severe degenerative changes involving the spine appears stable. There may be progress of advanced degenerative changes at L4-5 but I do not see any obvious paraspinal process to suggest infection. IMPRESSION: 1. Small bilateral pleural effusions and overlying atelectasis. 2. Advanced three-vessel coronary artery calcifications and moderate atherosclerotic calcification involving the thoracic and abdominal aorta and branch vessels. 3. Numerous bilateral renal cysts appear stable. 4. No acute abdominal/pelvic findings, mass lesions or adenopathy. 5. Mild uniform bladder wall thickening without discrete mass. 6. Severe and progressive degenerative changes in the lower  lumbar spine, particularly at L4-5. Electronically Signed   By: Marijo Sanes M.D.   On: 08/17/2015 16:02   Dg Chest 2 View  08/11/2015  CLINICAL DATA:  Shortness of breath. Altered mental status. Choked while eating this morning. Possible aspiration. EXAM: CHEST  2 VIEW COMPARISON:  07/04/2015. FINDINGS: Stable enlarged cardiac silhouette and post CABG changes. Interval mild patchy and linear density at the right lung base. Stable prominence of the interstitial markings. Mild increase in prominence of the pulmonary vasculature. Breathing motion blurring on the lateral view. IMPRESSION: 1. Atelectasis at the right lung base. Superimposed aspiration is unlikely based on the current appearance. 2. Stable cardiomegaly and mild chronic interstitial lung disease with interval mild pulmonary vascular congestion. Electronically Signed   By: Claudie Revering M.D.   On: 08/11/2015 17:07   Ct Head Wo Contrast  08/11/2015  CLINICAL DATA:  Initial encounter  for 69 YOM walking using his walker and fell backwards and hit his head today 08/11/15. EXAM: CT HEAD WITHOUT CONTRAST CT CERVICAL SPINE WITHOUT CONTRAST TECHNIQUE: Multidetector CT imaging of the head and cervical spine was performed following the standard protocol without intravenous contrast. Multiplanar CT image reconstructions of the cervical spine were also generated. COMPARISON:  Head CT of 05/26/2015. No prior cervical spine imaging. FINDINGS: CT HEAD FINDINGS Sinuses/Soft tissues: Mild left parietal scalp soft tissue thickening on image/series 67/4. Right maxillary sinus mucous retention cyst or polyp. No skull fracture. Clear mastoid air cells. Intracranial: Cerebral atrophy. Moderate low density in the periventricular white matter likely related to small vessel disease. Left cerebellar remote infarct. No mass lesion, hemorrhage, hydrocephalus, acute infarct, intra-axial, or extra-axial fluid collection. CT CERVICAL SPINE FINDINGS Spinal visualization through  the bottom of T4. Prevertebral soft tissues are within normal limits. Dense carotid atherosclerosis bilaterally. No apical pneumothorax. Advanced spondylosis. Left worse than right neural foraminal narrowing at multiple levels. Mild central canal stenosis, most significant at C3-4. Prior median sternotomy. Maintenance of vertebral body height. Trace C3-4 retrolisthesis. From C7 inferiorly are mildly degraded secondary to patient body habitus. Facets are well-aligned. Coronal reformats demonstrate a normal C1-C2 articulation. IMPRESSION: 1. Mild left parietal scalp soft tissue swelling. No acute intracranial abnormality. 2. Advanced cervical spondylosis. Trace C3-4 is favored to be degenerative. Otherwise, no acute findings in the cervical spine identified. 3.  Cerebral atrophy and small vessel ischemic change. Electronically Signed   By: Abigail Miyamoto M.D.   On: 08/11/2015 16:50   Ct Angio Chest Pe W/cm &/or Wo Cm  08/11/2015  CLINICAL DATA:  Worsening dyspnea.  Altered mental status. EXAM: CT ANGIOGRAPHY CHEST WITH CONTRAST TECHNIQUE: Multidetector CT imaging of the chest was performed using the standard protocol during bolus administration of intravenous contrast. Multiplanar CT image reconstructions and MIPs were obtained to evaluate the vascular anatomy. CONTRAST:  50mL OMNIPAQUE IOHEXOL 350 MG/ML SOLN COMPARISON:  08/28/2014 FINDINGS: Cardiovascular: There is good opacification of the pulmonary arteries. There is no pulmonary embolism. The thoracic aorta is normal in caliber and intact. There is extensive calcified coronary artery plaque. There are multiple coronary artery bypass grafts. Lungs: There are multifocal confluent airspace opacities, central predominant. Considerations include alveolar edema, infectious infiltrate. Hemorrhage or neoplasm less likely but not entirely excluded. Central airways: Patent Effusions: None Lymphadenopathy: Mildly prominent hilar and mediastinal nodes, more likely reactive  Esophagus: Unremarkable Upper abdomen: Unremarkable Musculoskeletal: No significant abnormality except for moderate thoracic degenerative disc disease, kyphosis, and prior sternotomy. Review of the MIP images confirms the above findings. IMPRESSION: 1. Negative for acute pulmonary embolism 2. Confluent airspace opacities bilaterally, right worse than left. Infectious infiltrate is a leading consideration. Alveolar edema less likely but not excluded. There also are mildly prominent hilar and mediastinal nodes which may be reactive. Electronically Signed   By: Andreas Newport M.D.   On: 08/11/2015 23:21   Ct Cervical Spine Wo Contrast  08/11/2015  CLINICAL DATA:  Initial encounter for 69 YOM walking using his walker and fell backwards and hit his head today 08/11/15. EXAM: CT HEAD WITHOUT CONTRAST CT CERVICAL SPINE WITHOUT CONTRAST TECHNIQUE: Multidetector CT imaging of the head and cervical spine was performed following the standard protocol without intravenous contrast. Multiplanar CT image reconstructions of the cervical spine were also generated. COMPARISON:  Head CT of 05/26/2015. No prior cervical spine imaging. FINDINGS: CT HEAD FINDINGS Sinuses/Soft tissues: Mild left parietal scalp soft tissue thickening on image/series 67/4. Right maxillary sinus  mucous retention cyst or polyp. No skull fracture. Clear mastoid air cells. Intracranial: Cerebral atrophy. Moderate low density in the periventricular white matter likely related to small vessel disease. Left cerebellar remote infarct. No mass lesion, hemorrhage, hydrocephalus, acute infarct, intra-axial, or extra-axial fluid collection. CT CERVICAL SPINE FINDINGS Spinal visualization through the bottom of T4. Prevertebral soft tissues are within normal limits. Dense carotid atherosclerosis bilaterally. No apical pneumothorax. Advanced spondylosis. Left worse than right neural foraminal narrowing at multiple levels. Mild central canal stenosis, most  significant at C3-4. Prior median sternotomy. Maintenance of vertebral body height. Trace C3-4 retrolisthesis. From C7 inferiorly are mildly degraded secondary to patient body habitus. Facets are well-aligned. Coronal reformats demonstrate a normal C1-C2 articulation. IMPRESSION: 1. Mild left parietal scalp soft tissue swelling. No acute intracranial abnormality. 2. Advanced cervical spondylosis. Trace C3-4 is favored to be degenerative. Otherwise, no acute findings in the cervical spine identified. 3.  Cerebral atrophy and small vessel ischemic change. Electronically Signed   By: Abigail Miyamoto M.D.   On: 08/11/2015 16:50   Mr Brain Wo Contrast  08/12/2015  CLINICAL DATA:  Initial evaluation for syncope. EXAM: MRI HEAD WITHOUT CONTRAST TECHNIQUE: Multiplanar, multiecho pulse sequences of the brain and surrounding structures were obtained without intravenous contrast. COMPARISON:  Prior CT from on 08/11/2015. FINDINGS: Diffuse prominence of the CSF containing spaces is compatible with generalized age-related cerebral atrophy. Patchy T2/FLAIR hyperintensity within the periventricular and deep white matter both cerebral hemispheres most likely related chronic small vessel ischemic disease. Encephalomalacia within the left cerebellar hemisphere consistent with remote infarct. Associated chronic hemosiderin staining present within this region. Probable remote infarct within the anteromedial left frontal lobe as well. There is a possible tiny punctate 4 mm focus of high signal intensity on DWI sequence involving the cortical gray matter of the posterior right frontal region (series 3, image 31 on axial sequence, series 5, image 14 on coronal sequence). Finding may reflect a tiny acute ischemic infarct. Corresponding signal loss not definitely seen on ADC map, although this is felt to be too small to be resolved. No other definite acute intracranial infarct. Abnormal flow void within the distal left vertebral artery,  which may related to focal plaque or possibly chronic dissection (series 8, image 3), similar to prior. Major intracranial vascular flow voids otherwise maintained. The right vertebral artery is diminutive. No acute intracranial hemorrhage. No mass lesion, midline shift, or mass effect. No hydrocephalus. No extra-axial fluid collection. Craniocervical junction grossly within normal limits. Degenerative spondylolysis noted within the partially visualized upper cervical spine. Pituitary gland grossly normal. No acute abnormality about the orbits. Sequela prior lens extraction present on the left. Mild mucosal thickening within the maxillary sinuses and ethmoidal air cells. Paranasal sinuses are otherwise clear. Minimal opacity within the right mastoid air cells. Inner ear structures normal. Bone marrow signal intensity within normal limits. No scalp soft tissue abnormality. IMPRESSION: 1. Question 4 mm punctate focus of high signal intensity on DWI sequence within the posterior right frontal lobe. Finding favored to reflect artifact, although possible tiny acute ischemic infarct is not entirely excluded, and could be considered in the correct clinical setting. 2. No other acute intracranial process. 3. 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 4. Abnormal flow void within the distal left vertebral artery, which may be related to focal plaque or possibly chronic dissection. This is similar relative to prior study. 5. Age-related cerebral atrophy with mild to moderate chronic small vessel ischemic disease. Electronically Signed  By: Jeannine Boga M.D.   On: 08/12/2015 02:40   US Renal  08/14/2015  CLINICAL DATA:  69 year old male with acute renal failure EXAM: RENAL / URINARY TRACT ULTRASOUND COMPLETE COMPARISON:  CT scan of the abdomen and pelvis 07/01/2015 FINDINGS: Right Kidney: Length: 12.2 cm. Technically challenging examination secondary to patient body  habitus and diffuse edema. The right kidney is very poorly seen. No obvious hydronephrosis. Left Kidney: Length: 12.3 cm. Poorly seen. No hydronephrosis. 4.3 cm anechoic simple cyst in the interpolar region. Bladder: Not visualized. IMPRESSION: 1. Technically limited study secondary to patient body habitus and diffuse soft tissue edema. 2. The right kidney is very poorly seen.  No obvious hydronephrosis. 3. No left-sided hydronephrosis. 4. Simple renal cysts on the left. Electronically Signed   By: Jacqulynn Cadet M.D.   On: 08/14/2015 21:25   Dg Chest Port 1 View  08/15/2015  CLINICAL DATA:  Short of breath, COPD, asthma EXAM: PORTABLE CHEST 1 VIEW COMPARISON:  Radiograph 08/12/2015 FINDINGS: Sternotomy wires overlie normal cardiac silhouette. Linear opacity at the RIGHT lung base again demonstrated slightly improved. Patchy perihilar airspace disease again demonstrated. No pneumothorax. IMPRESSION: 1. Slight improvement in density of RIGHT lower lobe atelectasis versus infiltrate. 2. No change in perihilar opacities representing mild edema or infection. Electronically Signed   By: Suzy Bouchard M.D.   On: 08/15/2015 08:02   Dg Abd Acute W/chest  08/12/2015  CLINICAL DATA:  Episode of nausea and vomiting earlier, no abdominal pain, history of bowel resection and breast malignancy. EXAM: DG ABDOMEN ACUTE W/ 1V CHEST COMPARISON:  Portable chest x-ray of August 11, 2015 and chest CT scan of the same day FINDINGS: The lungs are borderline hypoinflated. Confluent alveolar opacities are present at both lung bases greater on the right than on the left. A trace of pleural fluid on the right is suspected. The cardiac silhouette is mildly enlarged. The central pulmonary vascularity is engorged and indistinct. The patient has undergone previous CABG. The stool burden within the colon is mildly increased. There is a moderate amount of gas within loops of the sigmoid colon. There is no small or large bowel  obstructive pattern and no evidence of perforation. There is degenerative change of the lower lumbar spine. There is contrast within the urinary bladder from the earlier CT scan. There is splenic arterial calcification. IMPRESSION: 1. Patchy airspace opacities bilaterally consistent with pneumonia. A small amount of pleural fluid is likely present on the right. 2. CHF with mild pulmonary vascular congestion. 3. No acute intra-abdominal abnormality is observed. Increased colonic stool burden may reflect constipation in the appropriate clinical setting. Electronically Signed   By: David  Martinique M.D.   On: 08/12/2015 08:16    Oren Binet, MD  [facility] Pager:[phone]  If 7PM-7AM, please contact night-coverage [email or web address] Password [email or web address] 08/21/2015, 9:49 AM   LOS: 10 days

## 2015-08-21 NOTE — Progress Notes (Signed)
Physical Therapy Treatment Patient Details Name: [name] MRN: [id number] DOB: [birth year] Today's Date: 08/21/2015    History of Present Illness 69 y.o. male who looks older than stated age has new acute respiratory failure after episode of sepsis from cellulitis and new R frontal punctate infarct.  PHx:  COPD, HTN, PNA, falls    PT Comments    Mr. Trindade was anxious about falling w/ sit<>stand and ambulation but was able to ambulate 8 ft in room w/ mod assist for stability using RW.  Pt will benefit from continued skilled PT services to increase functional independence and safety.   Follow Up Recommendations  SNF     Equipment Recommendations  None recommended by PT    Recommendations for Other Services       Precautions / Restrictions Precautions Precautions: Fall Precaution Comments: pt very anxious about falling Restrictions Weight Bearing Restrictions: No    Mobility  Bed Mobility Overal bed mobility: Needs Assistance Bed Mobility: Supine to Sit     Supine to sit: Min guard;HOB elevated     General bed mobility comments: HOB elevated and pt uses bed rail.  Requires cues and encouragement as he repeats "I can't do this, I'm not strong enough"  Transfers Overall transfer level: Needs assistance Equipment used: Rolling walker (2 wheeled) Transfers: Sit to/from Stand Sit to Stand: +2 physical assistance;Mod assist         General transfer comment: Assist to boost up to standing and to stabilize when pt standing.  Provided pt w/ cues for technique.  He requires encouragement as he is very anxious about falling.   Ambulation/Gait Ambulation/Gait assistance: Mod assist;+2 safety/equipment Ambulation Distance (Feet): 8 Feet Assistive device: Rolling walker (2 wheeled) Gait Pattern/deviations: Decreased stride length;Antalgic;Trunk flexed;Staggering left;Staggering right Gait velocity: decreased Gait velocity interpretation: <1.8 ft/sec, indicative of risk  for recurrent falls General Gait Details: Pt unstable and staggering Lt and Rt.  Mod assist required at time to stabilize pt.  Cues for technique using RW.   Stairs            Wheelchair Mobility    Modified Rankin (Stroke Patients Only)       Balance Overall balance assessment: Needs assistance Sitting-balance support: Bilateral upper extremity supported;Feet supported Sitting balance-Leahy Scale: Fair Sitting balance - Comments: Close min guard 2/2 pt's cognitive impairments and dec safety awareness   Standing balance support: Bilateral upper extremity supported;During functional activity Standing balance-Leahy Scale: Poor Standing balance comment: Relies on RW and physical assist to stabilize                    Cognition Arousal/Alertness: Awake/alert;Lethargic Behavior During Therapy: Anxious Overall Cognitive Status: No family/caregiver present to determine baseline cognitive functioning Area of Impairment: Orientation;Attention;Memory;Following commands;Safety/judgement;Problem solving;Awareness Orientation Level: Disoriented to;Time;Situation Current Attention Level: Sustained Memory: Decreased short-term memory Following Commands: Follows multi-step commands with increased time;Follows multi-step commands inconsistently Safety/Judgement: Decreased awareness of safety;Decreased awareness of deficits Awareness: Emergent Problem Solving: Slow processing;Decreased initiation;Difficulty sequencing;Requires verbal cues;Requires tactile cues      Exercises      General Comments        Pertinent Vitals/Pain Pain Assessment: 0-10 Pain Score: 5  Pain Location: Bil LEs Pain Descriptors / Indicators: Aching Pain Intervention(s): Limited activity within patient's tolerance;Monitored during session;Repositioned    Home Living                      Prior Function  PT Goals (current goals can now be found in the care plan section) Acute  Rehab PT Goals Patient Stated Goal: none stated PT Goal Formulation: With patient Time For Goal Achievement: 08/27/15 Potential to Achieve Goals: Fair Progress towards PT goals: Progressing toward goals    Frequency  Min 2X/week    PT Plan Current plan remains appropriate    Co-evaluation             End of Session Equipment Utilized During Treatment: Oxygen;Gait belt Activity Tolerance: Patient limited by fatigue Patient left: in chair;with chair alarm set (w/ wound care RN)     Time: [id number] PT Time Calculation (min) (ACUTE ONLY): 20 min  Charges:  $Gait Training: 8-22 mins                    G Codes:      Joslyn Hy PT, [place] [id number] Pager: [phone] 08/21/2015, 1:38 PM

## 2015-08-21 NOTE — Progress Notes (Signed)
Orthopedic Tech Progress Note Patient Details:  [name] 1945-11-05 [id number]  Ortho Devices Type of Ortho Device: Louretta Parma boot Ortho Device/Splint Location: Bilateral unna boots Ortho Device/Splint Interventions: Application   [place] Pink 08/21/2015, 11:12 AM

## 2015-08-21 NOTE — Progress Notes (Signed)
Patient has refused CPAP for the night. RT will continue to monitor.  

## 2015-08-21 NOTE — Care Management Important Message (Signed)
Important Message  Patient Details  Name: [name] MRN: [id number] Date of Birth: [birth year]   Medicare Important Message Given:  Yes    Yittel Emrich P [place] 08/21/2015, 3:21 PM

## 2015-08-22 DIAGNOSIS — K766 Portal hypertension: Secondary | ICD-10-CM

## 2015-08-22 LAB — BASIC METABOLIC PANEL
Anion gap: 8 (ref 5–15)
BUN: 39 mg/dL — AB (ref 6–20)
CHLORIDE: 102 mmol/L (ref 101–111)
CO2: 28 mmol/L (ref 22–32)
CREATININE: 1.53 mg/dL — AB (ref 0.61–1.24)
Calcium: 9.3 mg/dL (ref 8.9–10.3)
GFR calc Af Amer: 51 mL/min — ABNORMAL LOW (ref >60)
GFR calc non Af Amer: 44 mL/min — ABNORMAL LOW (ref >60)
GLUCOSE: 123 mg/dL — AB (ref 65–99)
Potassium: 4.1 mmol/L (ref 3.5–5.1)
SODIUM: 138 mmol/L (ref 135–145)

## 2015-08-22 LAB — GLUCOSE, CAPILLARY
GLUCOSE-CAPILLARY: 138 mg/dL — AB (ref 65–99)
Glucose-Capillary: 176 mg/dL — ABNORMAL HIGH (ref 65–99)

## 2015-08-22 MED ORDER — HYDRALAZINE HCL 100 MG PO TABS: 100.0000 mg | ORAL_TABLET | Freq: Three times a day (TID) | ORAL | Status: DC

## 2015-08-22 MED ORDER — IPRATROPIUM-ALBUTEROL 0.5-2.5 (3) MG/3ML IN SOLN: 3.0000 mL | Freq: Two times a day (BID) | RESPIRATORY_TRACT | Status: DC

## 2015-08-22 MED ORDER — ISOSORBIDE DINITRATE 10 MG PO TABS: 10.0000 mg | ORAL_TABLET | Freq: Three times a day (TID) | ORAL | Status: DC

## 2015-08-22 MED ORDER — FUROSEMIDE 40 MG PO TABS: 80.0000 mg | ORAL_TABLET | Freq: Two times a day (BID) | ORAL | Status: DC

## 2015-08-22 MED ORDER — POTASSIUM CHLORIDE CRYS ER 20 MEQ PO TBCR: 20.0000 meq | EXTENDED_RELEASE_TABLET | Freq: Two times a day (BID) | ORAL | Status: DC

## 2015-08-22 MED ORDER — INSULIN ASPART 100 UNIT/ML ~~LOC~~ SOLN: SUBCUTANEOUS | Status: DC

## 2015-08-22 MED ORDER — OXYCODONE HCL 10 MG PO TABS: 10.0000 mg | ORAL_TABLET | Freq: Three times a day (TID) | ORAL | Status: DC | PRN

## 2015-08-22 MED ORDER — ALBUTEROL SULFATE (2.5 MG/3ML) 0.083% IN NEBU: 2.5000 mg | INHALATION_SOLUTION | RESPIRATORY_TRACT | Status: AC | PRN

## 2015-08-22 MED ORDER — SILDENAFIL CITRATE 20 MG PO TABS: 20.0000 mg | ORAL_TABLET | Freq: Three times a day (TID) | ORAL | Status: AC

## 2015-08-22 NOTE — Progress Notes (Signed)
Advanced Heart Failure Rounding Note   Subjective:    Feels fine. Denies dyspnea. Weight stable.   Objective:   Weight Range: 101.696 kg (224 lb 3.2 oz) Body mass index is 34.1 kg/(m^2).   Vital Signs:   Temp:  [97.4 F (36.3 C)-97.9 F (36.6 C)] 97.8 F (36.6 C) (11/11 0704) Pulse Rate:  [66-79] 79 (11/11 0845) Resp:  [16-24] 19 (11/11 0430) BP: (105-124)/(52-65) 113/52 mmHg (11/11 0845) SpO2:  [90 %-100 %] 97 % (11/11 0430) Weight:  [101.696 kg (224 lb 3.2 oz)] 101.696 kg (224 lb 3.2 oz) (11/11 0636) Last BM Date: 08/18/15 (per pt )  Weight change: Filed Weights   08/20/15 0424 08/21/15 0500 08/22/15 0636  Weight: 105.915 kg (233 lb 8 oz) 101.787 kg (224 lb 6.4 oz) 101.696 kg (224 lb 3.2 oz)    Intake/Output:   Intake/Output Summary (Last 24 hours) at 08/22/15 1046 Last data filed at 08/22/15 0819  Gross per 24 hour  Intake   1080 ml  Output   1550 ml  Net   -470 ml     Physical Exam: General: Elderly and chronically ill appearing. NAD HEENT: normal Neck: supple. JVP 10  Carotids 2+ bilat; no bruits. No thyromegaly or nodule noted. Cor: PMI nondisplaced. Irregularly irregular. No M/G/R noted. Lungs: CTA, normal effort. Abdomen: Obese, soft, NT, ND, no hepatosplenomegaly. No bruits or masses. Good bowel sounds. Extremities: no cyanosis, clubbing, rash. UNNA boots present.  Minimal pretibial edema, trace-1+ ankle edema Neuro: alert & orientedx3, cranial nerves grossly intact. moves all 4 extremities w/o difficulty. Affect pleasant  Telemetry: Afib 80s  Labs: CBC No results for input(s): WBC, NEUTROABS, HGB, HCT, MCV, PLT in the last 72 hours. Basic Metabolic Panel  Recent Labs  08/21/15 0422 08/22/15 0330  NA 137 138  K 4.0 4.1  CL 104 102  CO2 26 28  GLUCOSE 151* 123*  BUN 31* 39*  CREATININE 1.41* 1.53*  CALCIUM 9.5 9.3   Liver Function Tests  Recent Labs  08/20/15 0503 08/21/15 0422  AST 13* 14*  ALT 8* 8*  ALKPHOS 43 46  BILITOT 0.4  0.4  PROT 6.3* 7.2  ALBUMIN 2.7* 2.8*   BNP: BNP (last 3 results)  Recent Labs  07/01/15 1627 08/11/15 1616 08/14/15 1416  BNP 331.3* 299.1* 584.6*    Medications:     Scheduled Medications: . acetaZOLAMIDE  500 mg Oral BID  . allopurinol  300 mg Oral Daily  . aspirin EC  325 mg Oral Daily  . atorvastatin  20 mg Oral Daily  . budesonide (PULMICORT) nebulizer solution  0.25 mg Nebulization BID  . carvedilol  6.25 mg Oral BID WC  . colchicine  0.6 mg Oral Daily  . enoxaparin (LOVENOX) injection  40 mg Subcutaneous Q24H  . FLUoxetine  20 mg Oral Daily  . folic acid  1 mg Oral Daily  . furosemide  80 mg Intravenous TID  . gabapentin  300 mg Oral BID  . hydrALAZINE  100 mg Oral 3 times per day  . insulin aspart  0-9 Units Subcutaneous TID WC  . ipratropium-albuterol  3 mL Nebulization BID  . isosorbide dinitrate  10 mg Oral TID  . levothyroxine  187 mcg Oral QAC breakfast  . magnesium oxide  400 mg Oral BID  . pantoprazole  40 mg Oral Daily  . potassium chloride SA  20 mEq Oral BID  . sodium chloride  3 mL Intravenous Q12H  . sodium chloride  3 mL Intravenous  Q12H  . tamoxifen  20 mg Oral QHS  . thiamine  100 mg Oral Daily  . venlafaxine XR  37.5 mg Oral Daily    Infusions:    PRN Medications: sodium chloride, sodium chloride, acetaminophen, albuterol, ondansetron (ZOFRAN) IV, oxyCODONE, senna-docusate, sodium chloride, sodium chloride   Assessment/Plan   1. Acute on chronic diastolic heart failure: ECHO 08/16/15 LVEF 55-60 with Significant RV failure  - Volume status continues to improve, still only slightly up.  Think best option would be to continue IV diuresis one more day, and send to SNF in am. Want to be as dry as possible, and he is still responding to IV lasix and acetazolamide. Cr continues to improve.   - Continue IV lasix 80 mg TID  Continue diamox. 2. Chronic atrial fibrillation:  - Rate controlled on Coreg. - No anticoagulation due to ETOH abuse  and h/o falls. Continue high dose ASA.  3. CAD/CABG:  - NST in 2015 was negative for ischemia.  - Symptomatically stable from this standpoint.  -Continue ASA, statin and BB.  4. PNA:  -On Zosyn. 5. Essential HTN: - continue hydralazine 100 mg TID 6. Confusion:  - Much improved. ? from sundowning vs oxycodone.  - Ammonia level 41 08/17/15 7. RUQ pain: - LFTs normal - No acute findings on CT 08/17/15 8. PAH  Volume status much improved. Can go to SNF today. Suspect CVP will remain elevated due to [place] with transpulmonary gradient odf 33 and PVR 8.25. 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  Would increase lasix to 80 bid on d/c. Stop actazolamide. Decrease carvedilol to 3.125 bid.   F/u in HF clinic.   Nadie Fiumara,MD 10:46 AM

## 2015-08-22 NOTE — Clinical Social Work Placement (Signed)
   CLINICAL SOCIAL WORK PLACEMENT  NOTE  Date:  08/22/2015  Patient Details  Name: [name] MRN: [id number] Date of Birth: 29-Nov-1945  Clinical Social Work is seeking post-discharge placement for this patient at the [place] level of care (*CSW will initial, date and re-position this form in  chart as items are completed):  Yes   Patient/family provided with [facility] Work Department's list of facilities offering this level of care within the geographic area requested by the patient (or if unable, by the patient's family).  Yes   Patient/family informed of their freedom to choose among providers that offer the needed level of care, that participate in Medicare, Medicaid or managed care program needed by the patient, have an available bed and are willing to accept the patient.  Yes   Patient/family informed of [facility]'s ownership interest in [facility] and [facility], as well as of the fact that they are under no obligation to receive care at these facilities.  PASRR submitted to EDS on       PASRR number received on       Existing PASRR number confirmed on 08/14/15     FL2 transmitted to all facilities in geographic area requested by pt/family on       FL2 transmitted to all facilities within larger geographic area on       Patient informed that his/her managed care company has contracts with or will negotiate with certain facilities, including the following:        Yes   Patient/family informed of bed offers received.  Patient chooses bed at [facility]     Physician recommends and patient chooses bed at      Patient to be transferred to [facility] on 08/22/15.  Patient to be transferred to facility by Ambulance     Patient family notified on 08/22/15 of transfer.  Name of family member notified:  Darrold Junker     PHYSICIAN       Additional Comment:  Per MD patient ready for  DC to [facility] and [facility], patient, patient's family, and facility notified of DC. RN given number for report. DC packet on chart. Ambulance transport requested for patient for 12:30PM. CSW signing off.   _______________________________________________ Liz Beach MSW, [place], [place], [id number]

## 2015-08-22 NOTE — Discharge Summary (Signed)
PATIENT DETAILS Name: [name] Age: 69 y.o. Sex: male Date of Birth: 1946/07/19 MRN: [id number]. Admitting Physician: Rise Patience, MD [id number], MD  Admit Date: 08/11/2015 Discharge date: 08/22/2015  Recommendations for Outpatient Follow-up:  1. Daily weights-if rapid weight gain-please consult with cardiology. (Weight on discharge 224 pounds)  2. Please repeat CBC/BMET in 1 week  PRIMARY DISCHARGE DIAGNOSIS:  Principal Problem:   Acute respiratory failure with hypoxia (HCC) Active Problems:   Obstructive sleep apnea   Coronary atherosclerosis of native coronary artery   PAF (paroxysmal atrial fibrillation) (HCC)   Hypothyroidism   COPD (chronic obstructive pulmonary disease) (HCC)   Enterocutaneous fistula   Diabetes mellitus type 2, controlled ([place])   A-fib (HCC)   Acute encephalopathy   Stroke (cerebrum) (HCC)   ARF (acute renal failure) (HCC)   Acute on chronic diastolic heart failure (HCC)   Faintness   Hypoxia   Pulmonary hypertension ([place])   Persistent atrial fibrillation (HCC)      PAST MEDICAL HISTORY: Past Medical History  Diagnosis Date  . CHRONIC OBSTRUCTIVE PULMONARY DISEASE 06/20/2009  . CAROTID STENOSIS 06/20/2009    A. 08/2001 s/p L CEA;  B.   09/14/11 - Carotid U/S - 40-59% bilateral stenosis, left CEA patch angioplasty is patent  . DM 06/20/2009  . CAD 06/20/2009    A.  08/2000 - s/p CABG x 4 - LIMA-LAD, Left Radial-OM, VG-DIAG, VG-RCA;  B. Neg. MV  2010  . HYPERLIPIDEMIA 06/20/2009  . HYPERTENSION 06/20/2009  . Hypothyroidism   . Low back pain   . Asthma     as child  . Pneumonia   . Persistent atrial fibrillation (HCC)     Not felt to be coumadin candidate 2/2 ETOH use.  Marland Kitchen History of tobacco abuse     remote - quit 1970  . Morbidly obese ([place])   . Chronic diastolic heart failure, NYHA class 3 ([place])     Followed by [facility] --> Echo 04/2014: EF [id number], Diastolic DysFxn - elevated LVEDP & LAP. Mod Dil LA & RA.  . Falls  frequently   . Hx of cardiovascular stress test 05/2014    Lexiscan Myoview (8/15):  No ischemia; EF 63% - Normal Study  . Breast cancer, right breast ([place])   . Gout   . OBSTRUCTIVE SLEEP APNEA 06/20/2009    does not use cpap  . Stroke [facility])     found on MRI in Oct. 2015.   Marland Kitchen Shortness of breath dyspnea     occasional  . Depression     takes Prozac  . GERD (gastroesophageal reflux disease)     takes Prilosec  . Arthritis   . Neuromuscular disorder (HCC)     neuropathy in both legs  . Constipation   . ETOH abuse     no alcohol since Oct. 2015  . 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 CHF (congestive heart failure) ([place])   . Supplemental oxygen dependent     hs  . Bilateral renal cysts   . Kidney disease     Stage 3  . Pulmonary HTN ([place])     a. [place] 11/16: severe pulmonary HTN, PASP 57, PA sat 42%, Ao sat 90%, CO 4.2 L/min, CI 1.88, PCWP mean 24, RA mean 14    DISCHARGE MEDICATIONS: Current Discharge Medication List    START taking these medications   Details  albuterol (PROVENTIL) (2.5 MG/3ML) 0.083%  nebulizer solution Take 3 mLs (2.5 mg total) by nebulization every 2 (two) hours as needed for wheezing or shortness of breath.    hydrALAZINE (APRESOLINE) 100 MG tablet Take 1 tablet (100 mg total) by mouth every 8 (eight) hours.    insulin aspart (NOVOLOG) 100 UNIT/ML injection 0-9 Units, Subcutaneous, 3 times daily with meals, First dose on Tue 08/12/15 at 0800 Correction coverage: Sensitive (thin, NPO, renal) CBG < 70: implement hypoglycemia protocol CBG 70 - 120: 0 units CBG 121 - 150: 1 unit CBG 151 - 200: 2 units CBG 201 - 250: 3 units CBG 251 - 300: 5 units CBG 301 - 350: 7 units CBG 351 - 400: 9 units CBG > 400: call MD    ipratropium-albuterol (DUONEB) 0.5-2.5 (3) MG/3ML SOLN Take 3 mLs by nebulization 2 (two) times daily.    isosorbide dinitrate (ISORDIL) 10 MG tablet Take 1 tablet (10 mg total) by mouth 3  (three) times daily.    sildenafil (REVATIO) 20 MG tablet Take 1 tablet (20 mg total) by mouth 3 (three) times daily. Refills: 0      CONTINUE these medications which have CHANGED   Details  furosemide (LASIX) 40 MG tablet Take 2 tablets (80 mg total) by mouth 2 (two) times daily. Pt take 120 mg in AM and 80 mg in PM    Oxycodone HCl 10 MG TABS Take 1 tablet (10 mg total) by mouth 3 (three) times daily as needed (pain). Qty: 20 tablet, Refills: 0    potassium chloride SA (KLOR-CON M20) 20 MEQ tablet Take 1 tablet (20 mEq total) by mouth 2 (two) times daily.      CONTINUE these medications which have NOT CHANGED   Details  allopurinol (ZYLOPRIM) 300 MG tablet Take 1 tablet (300 mg total) by mouth daily. Qty: 30 tablet, Refills: 3    aspirin EC 325 MG EC tablet Take 1 tablet (325 mg total) by mouth daily. Qty: 30 tablet, Refills: 0    atorvastatin (LIPITOR) 20 MG tablet Take 1 tablet (20 mg total) by mouth daily. Qty: 30 tablet, Refills: 1    carvedilol (COREG) 3.125 MG tablet Take 2 tablets (6.25 mg total) by mouth 2 (two) times daily with a meal. Qty: 60 tablet, Refills: 1    colchicine 0.6 MG tablet Take 0.6 mg by mouth daily.    FLUoxetine (PROZAC) 20 MG capsule Take 1 capsule (20 mg total) by mouth daily. Qty: 30 capsule, Refills: 1    fluticasone (FLOVENT HFA) 110 MCG/ACT inhaler Inhale 2 puffs into the lungs 2 (two) times daily.    folic acid (FOLVITE) 1 MG tablet Take 1 tablet (1 mg total) by mouth daily. Qty: 30 tablet, Refills: 1    gabapentin (NEURONTIN) 300 MG capsule Take 1 capsule (300 mg total) by mouth 2 (two) times daily. Qty: 60 capsule, Refills: 1    levothyroxine (SYNTHROID, LEVOTHROID) 125 MCG tablet Take 1.5 tablets (187 mcg total) by mouth daily before breakfast. Qty: 30 tablet, Refills: 3    magnesium oxide (MAG-OX) 400 (241.3 MG) MG tablet Take 1 tablet (400 mg total) by mouth 2 (two) times daily. Qty: 60 tablet, Refills: 11   Associated  Diagnoses: Chronic diastolic heart failure ([place]); Chronic diastolic congestive heart failure (HCC)    omeprazole (PRILOSEC) 20 MG capsule Take 1 capsule (20 mg total) by mouth daily. Qty: 30 capsule, Refills: 1    tamoxifen (NOLVADEX) 20 MG tablet Take 1 tablet (20 mg total) by mouth daily. Qty:  90 tablet, Refills: 3   Associated Diagnoses: Cancer of male breast, right    venlafaxine XR (EFFEXOR-XR) 37.5 MG 24 hr capsule TAKE ONE CAPSULE BY MOUTH DAILY Qty: 30 capsule, Refills: 6      STOP taking these medications     albuterol-ipratropium (COMBIVENT) 18-103 MCG/ACT inhaler      metolazone (ZAROXOLYN) 2.5 MG tablet      neomycin-bacitracin-polymyxin (NEOSPORIN) OINT         ALLERGIES:   Allergies  Allergen Reactions  . Erythromycin Hives    Oramycin    BRIEF HPI:  See H&P, Labs, Consult and Test reports for all details in brief, 69 year old Caucasian male with history of chronic diastolic heart failure, prior history of alcohol abuse, chronic kidney disease stage III, diabetes, CAD status post CABG-admitted for evaluation of syncopal episode after vomiting. Found to have suspected aspiration pneumonia, acute diastolic heart failure causing acute hypoxic respiratory failure.  CONSULTATIONS:   cardiology  PERTINENT RADIOLOGIC STUDIES: Ct Abdomen Pelvis Wo Contrast  08/17/2015  CLINICAL DATA:  Right upper quadrant abdominal pain and right upper quadrant tendinous on physical examination today. EXAM: CT ABDOMEN AND PELVIS WITHOUT CONTRAST TECHNIQUE: Multidetector CT imaging of the abdomen and pelvis was performed following the standard protocol without IV contrast. COMPARISON:  07/01/2015 FINDINGS: Lower chest: Small bilateral pleural effusions with overlying atelectasis. The heart is enlarged. Advanced three-vessel coronary artery calcifications and moderate aortic calcifications. The distal esophagus is grossly normal. Hepatobiliary: No focal hepatic lesions or intrahepatic  biliary dilatation. The gallbladder is normal. No common bile duct dilatation. Pancreas: Mild diffuse atrophy. No mass, inflammation or ductal dilatation. Spleen: Normal size.  No focal lesions. Adrenals/Urinary Tract: The adrenal glands are normal. Stable bilateral renal cysts. No worrisome renal lesion, renal calculi or obstructing ureteral calculi. No bladder calculi. Mild uniform bladder wall thickening. Stomach/Bowel: The stomach, duodenum, small bowel and colon are grossly normal without oral contrast. No obvious inflammatory changes, mass lesions or obstructive findings. There is diverticulosis involving the descending and sigmoid colon without definite findings for acute diverticulitis. Moderate stool throughout the colon and down into the rectum may suggest constipation. The terminal ileum is normal. The appendix is normal. Vascular/Lymphatic: No mesenteric or retroperitoneal mass or adenopathy. Stable small scattered lymph nodes. Stable aortic and branch vessel ostial calcifications. No focal aneurysm. Other: Mild uniform bladder wall thickening. The prostate gland and seminal vesicles are unremarkable. No pelvic mass or adenopathy. No free pelvic fluid collections. Small scattered lymph nodes are stable. No inguinal mass or adenopathy. Stable surgical changes involving the anterior abdominal wall. Musculoskeletal: No significant bony findings. Severe degenerative changes involving the spine appears stable. There may be progress of advanced degenerative changes at L4-5 but I do not see any obvious paraspinal process to suggest infection. IMPRESSION: 1. Small bilateral pleural effusions and overlying atelectasis. 2. Advanced three-vessel coronary artery calcifications and moderate atherosclerotic calcification involving the thoracic and abdominal aorta and branch vessels. 3. Numerous bilateral renal cysts appear stable. 4. No acute abdominal/pelvic findings, mass lesions or adenopathy. 5. Mild uniform  bladder wall thickening without discrete mass. 6. Severe and progressive degenerative changes in the lower lumbar spine, particularly at L4-5. Electronically Signed   By: Marijo Sanes M.D.   On: 08/17/2015 16:02   Dg Chest 2 View  08/11/2015  CLINICAL DATA:  Shortness of breath. Altered mental status. Choked while eating this morning. Possible aspiration. EXAM: CHEST  2 VIEW COMPARISON:  07/04/2015. FINDINGS: Stable enlarged cardiac silhouette and post CABG changes.  Interval mild patchy and linear density at the right lung base. Stable prominence of the interstitial markings. Mild increase in prominence of the pulmonary vasculature. Breathing motion blurring on the lateral view. IMPRESSION: 1. Atelectasis at the right lung base. Superimposed aspiration is unlikely based on the current appearance. 2. Stable cardiomegaly and mild chronic interstitial lung disease with interval mild pulmonary vascular congestion. Electronically Signed   By: Claudie Revering M.D.   On: 08/11/2015 17:07   Ct Head Wo Contrast  08/11/2015  CLINICAL DATA:  Initial encounter for 69 YOM walking using his walker and fell backwards and hit his head today 08/11/15. EXAM: CT HEAD WITHOUT CONTRAST CT CERVICAL SPINE WITHOUT CONTRAST TECHNIQUE: Multidetector CT imaging of the head and cervical spine was performed following the standard protocol without intravenous contrast. Multiplanar CT image reconstructions of the cervical spine were also generated. COMPARISON:  Head CT of 05/26/2015. No prior cervical spine imaging. FINDINGS: CT HEAD FINDINGS Sinuses/Soft tissues: Mild left parietal scalp soft tissue thickening on image/series 67/4. Right maxillary sinus mucous retention cyst or polyp. No skull fracture. Clear mastoid air cells. Intracranial: Cerebral atrophy. Moderate low density in the periventricular white matter likely related to small vessel disease. Left cerebellar remote infarct. No mass lesion, hemorrhage, hydrocephalus, acute  infarct, intra-axial, or extra-axial fluid collection. CT CERVICAL SPINE FINDINGS Spinal visualization through the bottom of T4. Prevertebral soft tissues are within normal limits. Dense carotid atherosclerosis bilaterally. No apical pneumothorax. Advanced spondylosis. Left worse than right neural foraminal narrowing at multiple levels. Mild central canal stenosis, most significant at C3-4. Prior median sternotomy. Maintenance of vertebral body height. Trace C3-4 retrolisthesis. From C7 inferiorly are mildly degraded secondary to patient body habitus. Facets are well-aligned. Coronal reformats demonstrate a normal C1-C2 articulation. IMPRESSION: 1. Mild left parietal scalp soft tissue swelling. No acute intracranial abnormality. 2. Advanced cervical spondylosis. Trace C3-4 is favored to be degenerative. Otherwise, no acute findings in the cervical spine identified. 3.  Cerebral atrophy and small vessel ischemic change. Electronically Signed   By: Abigail Miyamoto M.D.   On: 08/11/2015 16:50   Ct Angio Chest Pe W/cm &/or Wo Cm  08/11/2015  CLINICAL DATA:  Worsening dyspnea.  Altered mental status. EXAM: CT ANGIOGRAPHY CHEST WITH CONTRAST TECHNIQUE: Multidetector CT imaging of the chest was performed using the standard protocol during bolus administration of intravenous contrast. Multiplanar CT image reconstructions and MIPs were obtained to evaluate the vascular anatomy. CONTRAST:  38mL OMNIPAQUE IOHEXOL 350 MG/ML SOLN COMPARISON:  08/28/2014 FINDINGS: Cardiovascular: There is good opacification of the pulmonary arteries. There is no pulmonary embolism. The thoracic aorta is normal in caliber and intact. There is extensive calcified coronary artery plaque. There are multiple coronary artery bypass grafts. Lungs: There are multifocal confluent airspace opacities, central predominant. Considerations include alveolar edema, infectious infiltrate. Hemorrhage or neoplasm less likely but not entirely excluded. Central  airways: Patent Effusions: None Lymphadenopathy: Mildly prominent hilar and mediastinal nodes, more likely reactive Esophagus: Unremarkable Upper abdomen: Unremarkable Musculoskeletal: No significant abnormality except for moderate thoracic degenerative disc disease, kyphosis, and prior sternotomy. Review of the MIP images confirms the above findings. IMPRESSION: 1. Negative for acute pulmonary embolism 2. Confluent airspace opacities bilaterally, right worse than left. Infectious infiltrate is a leading consideration. Alveolar edema less likely but not excluded. There also are mildly prominent hilar and mediastinal nodes which may be reactive. Electronically Signed   By: Andreas Newport M.D.   On: 08/11/2015 23:21   Ct Cervical Spine Wo Contrast  08/11/2015  CLINICAL DATA:  Initial encounter for 69 YOM walking using his walker and fell backwards and hit his head today 08/11/15. EXAM: CT HEAD WITHOUT CONTRAST CT CERVICAL SPINE WITHOUT CONTRAST TECHNIQUE: Multidetector CT imaging of the head and cervical spine was performed following the standard protocol without intravenous contrast. Multiplanar CT image reconstructions of the cervical spine were also generated. COMPARISON:  Head CT of 05/26/2015. No prior cervical spine imaging. FINDINGS: CT HEAD FINDINGS Sinuses/Soft tissues: Mild left parietal scalp soft tissue thickening on image/series 67/4. Right maxillary sinus mucous retention cyst or polyp. No skull fracture. Clear mastoid air cells. Intracranial: Cerebral atrophy. Moderate low density in the periventricular white matter likely related to small vessel disease. Left cerebellar remote infarct. No mass lesion, hemorrhage, hydrocephalus, acute infarct, intra-axial, or extra-axial fluid collection. CT CERVICAL SPINE FINDINGS Spinal visualization through the bottom of T4. Prevertebral soft tissues are within normal limits. Dense carotid atherosclerosis bilaterally. No apical pneumothorax. Advanced  spondylosis. Left worse than right neural foraminal narrowing at multiple levels. Mild central canal stenosis, most significant at C3-4. Prior median sternotomy. Maintenance of vertebral body height. Trace C3-4 retrolisthesis. From C7 inferiorly are mildly degraded secondary to patient body habitus. Facets are well-aligned. Coronal reformats demonstrate a normal C1-C2 articulation. IMPRESSION: 1. Mild left parietal scalp soft tissue swelling. No acute intracranial abnormality. 2. Advanced cervical spondylosis. Trace C3-4 is favored to be degenerative. Otherwise, no acute findings in the cervical spine identified. 3.  Cerebral atrophy and small vessel ischemic change. Electronically Signed   By: Abigail Miyamoto M.D.   On: 08/11/2015 16:50   Mr Brain Wo Contrast  08/12/2015  CLINICAL DATA:  Initial evaluation for syncope. EXAM: MRI HEAD WITHOUT CONTRAST TECHNIQUE: Multiplanar, multiecho pulse sequences of the brain and surrounding structures were obtained without intravenous contrast. COMPARISON:  Prior CT from on 08/11/2015. FINDINGS: Diffuse prominence of the CSF containing spaces is compatible with generalized age-related cerebral atrophy. Patchy T2/FLAIR hyperintensity within the periventricular and deep white matter both cerebral hemispheres most likely related chronic small vessel ischemic disease. Encephalomalacia within the left cerebellar hemisphere consistent with remote infarct. Associated chronic hemosiderin staining present within this region. Probable remote infarct within the anteromedial left frontal lobe as well. There is a possible tiny punctate 4 mm focus of high signal intensity on DWI sequence involving the cortical gray matter of the posterior right frontal region (series 3, image 31 on axial sequence, series 5, image 14 on coronal sequence). Finding may reflect a tiny acute ischemic infarct. Corresponding signal loss not definitely seen on ADC map, although this is felt to be too small to be  resolved. No other definite acute intracranial infarct. Abnormal flow void within the distal left vertebral artery, which may related to focal plaque or possibly chronic dissection (series 8, image 3), similar to prior. Major intracranial vascular flow voids otherwise maintained. The right vertebral artery is diminutive. No acute intracranial hemorrhage. No mass lesion, midline shift, or mass effect. No hydrocephalus. No extra-axial fluid collection. Craniocervical junction grossly within normal limits. Degenerative spondylolysis noted within the partially visualized upper cervical spine. Pituitary gland grossly normal. No acute abnormality about the orbits. Sequela prior lens extraction present on the left. Mild mucosal thickening within the maxillary sinuses and ethmoidal air cells. Paranasal sinuses are otherwise clear. Minimal opacity within the right mastoid air cells. Inner ear structures normal. Bone marrow signal intensity within normal limits. No scalp soft tissue abnormality. IMPRESSION: 1. Question 4 mm punctate focus of high signal intensity on DWI sequence  within the posterior right frontal lobe. Finding favored to reflect artifact, although possible tiny acute ischemic infarct is not entirely excluded, and could be considered in the correct clinical setting. 2. No other acute intracranial process. 3. 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 4. Abnormal flow void within the distal left vertebral artery, which may be related to focal plaque or possibly chronic dissection. This is similar relative to prior study. 5. Age-related cerebral atrophy with mild to moderate chronic small vessel ischemic disease. Electronically Signed   By: Jeannine Boga M.D.   On: 08/12/2015 02:40   US Renal  08/14/2015  CLINICAL DATA:  69 year old male with acute renal failure EXAM: RENAL / URINARY TRACT ULTRASOUND COMPLETE COMPARISON:  CT scan of the abdomen and pelvis  07/01/2015 FINDINGS: Right Kidney: Length: 12.2 cm. Technically challenging examination secondary to patient body habitus and diffuse edema. The right kidney is very poorly seen. No obvious hydronephrosis. Left Kidney: Length: 12.3 cm. Poorly seen. No hydronephrosis. 4.3 cm anechoic simple cyst in the interpolar region. Bladder: Not visualized. IMPRESSION: 1. Technically limited study secondary to patient body habitus and diffuse soft tissue edema. 2. The right kidney is very poorly seen.  No obvious hydronephrosis. 3. No left-sided hydronephrosis. 4. Simple renal cysts on the left. Electronically Signed   By: Jacqulynn Cadet M.D.   On: 08/14/2015 21:25   Dg Chest Port 1 View  08/15/2015  CLINICAL DATA:  Short of breath, COPD, asthma EXAM: PORTABLE CHEST 1 VIEW COMPARISON:  Radiograph 08/12/2015 FINDINGS: Sternotomy wires overlie normal cardiac silhouette. Linear opacity at the RIGHT lung base again demonstrated slightly improved. Patchy perihilar airspace disease again demonstrated. No pneumothorax. IMPRESSION: 1. Slight improvement in density of RIGHT lower lobe atelectasis versus infiltrate. 2. No change in perihilar opacities representing mild edema or infection. Electronically Signed   By: Suzy Bouchard M.D.   On: 08/15/2015 08:02   Dg Abd Acute W/chest  08/12/2015  CLINICAL DATA:  Episode of nausea and vomiting earlier, no abdominal pain, history of bowel resection and breast malignancy. EXAM: DG ABDOMEN ACUTE W/ 1V CHEST COMPARISON:  Portable chest x-ray of August 11, 2015 and chest CT scan of the same day FINDINGS: The lungs are borderline hypoinflated. Confluent alveolar opacities are present at both lung bases greater on the right than on the left. A trace of pleural fluid on the right is suspected. The cardiac silhouette is mildly enlarged. The central pulmonary vascularity is engorged and indistinct. The patient has undergone previous CABG. The stool burden within the colon is mildly  increased. There is a moderate amount of gas within loops of the sigmoid colon. There is no small or large bowel obstructive pattern and no evidence of perforation. There is degenerative change of the lower lumbar spine. There is contrast within the urinary bladder from the earlier CT scan. There is splenic arterial calcification. IMPRESSION: 1. Patchy airspace opacities bilaterally consistent with pneumonia. A small amount of pleural fluid is likely present on the right. 2. CHF with mild pulmonary vascular congestion. 3. No acute intra-abdominal abnormality is observed. Increased colonic stool burden may reflect constipation in the appropriate clinical setting. Electronically Signed   By: David  Martinique M.D.   On: 08/12/2015 08:16     PERTINENT LAB RESULTS: CBC: No results for input(s): WBC, HGB, HCT, PLT in the last 72 hours. CMET CMP     Component Value Date/Time   NA 138 08/22/2015 0330   NA 138 10/22/2014 1407  K 4.1 08/22/2015 0330   K 3.9 10/22/2014 1407   CL 102 08/22/2015 0330   CO2 28 08/22/2015 0330   CO2 30* 10/22/2014 1407   GLUCOSE 123* 08/22/2015 0330   GLUCOSE 130 10/22/2014 1407   BUN 39* 08/22/2015 0330   BUN 55.3* 10/22/2014 1407   CREATININE 1.53* 08/22/2015 0330   CREATININE 1.34* 07/22/2015 1411   CREATININE 1.4* 10/22/2014 1407   CALCIUM 9.3 08/22/2015 0330   CALCIUM 8.6 10/22/2014 1407   PROT 7.2 08/21/2015 0422   PROT 6.6 10/22/2014 1407   ALBUMIN 2.8* 08/21/2015 0422   ALBUMIN 3.2* 10/22/2014 1407   AST 14* 08/21/2015 0422   AST 16 10/22/2014 1407   ALT 8* 08/21/2015 0422   ALT 6 10/22/2014 1407   ALKPHOS 46 08/21/2015 0422   ALKPHOS 68 10/22/2014 1407   BILITOT 0.4 08/21/2015 0422   BILITOT 0.46 10/22/2014 1407   GFRNONAA 44* 08/22/2015 0330   GFRAA 51* 08/22/2015 0330    GFR Estimated Creatinine Clearance: 51.2 mL/min (by C-G formula based on Cr of 1.53). No results for input(s): LIPASE, AMYLASE in the last 72 hours. No results for input(s):  CKTOTAL, CKMB, CKMBINDEX, TROPONINI in the last 72 hours. Invalid input(s): POCBNP No results for input(s): DDIMER in the last 72 hours. No results for input(s): HGBA1C in the last 72 hours. No results for input(s): CHOL, HDL, LDLCALC, TRIG, CHOLHDL, LDLDIRECT in the last 72 hours. No results for input(s): TSH, T4TOTAL, T3FREE, THYROIDAB in the last 72 hours.  Invalid input(s): FREET3 No results for input(s): VITAMINB12, FOLATE, FERRITIN, TIBC, IRON, RETICCTPCT in the last 72 hours. Coags: No results for input(s): INR in the last 72 hours.  Invalid input(s): PT Microbiology: No results found for this or any previous visit (from the past 240 hour(s)).   BRIEF HOSPITAL COURSE:  Brief narrative:  69 year old Caucasian male with history of chronic diastolic heart failure, prior history of alcohol abuse, chronic kidney disease stage III, diabetes, CAD status post CABG-admitted for evaluation of syncopal episode after vomiting. Found to have suspected aspiration pneumonia, acute diastolic heart failure causing acute hypoxic respiratory failure. Hospital course complicated by development of worsening renal failure. Cardiology performed [place] on 11/4 which showed severe pulmonary hypertension. Diuresed very aggressively with IV Lasix and Diamox-weight significantly reduced to 224 pounds on discharge-was 253 pounds on admission. See below for further details   Hospital course by problem list: Acute respiratory failure with hypoxia: Suspect multifactorial-from probable aspiration pneumonia,acute diastolic heart failure, underlying pulmonary hypertension. Improved with Lasix/Diamox-has completed course of IV Abx. Titrate off oxygen as tolerated-follow closely while at SNF  Active Problems: ?Syncope/Pre syncope: In a setting of vomiting-? Vagal. Telemetry-shows A. fib-but no other arrhythmias. Recent echocardiogram on 06/12/15 shows EF around 60-65%. EEG negative for seizure-like activity.   Probable  aspiration pneumonia: Improved, remains afebrile and without leukocytosis. Completed 7 days of Zosyn on 11/7. Blood cultures negative so far, urine streptococcal and Legionella antigen negative. No need for any further antibiotics on discharge.  Acute on chronic diastolic heart failure/right sided heart failure: Dry weight appears to be around 244 pounds-significant diuresis with IV Lasix and Diamox-weight now decreased to 224 pounds (253 pounds on admission),neg balance of 12 L. Cardiology recommending to start sildenafil for pulmonary hypertension, will be discharged on Lasix-would require close outpatient follow-up with cardiology. Please check daily weights.  Acute on CKD stage 3: Resolving, creatinine close to usual baseline. Acute renal failure likely multifactorial in a setting of acute illness/prerenal azotemia/diuretics/possible contrast-induced  nephropathy. Suspect CKD likely secondary to underlying diabetic nephropathy.  Note renal ultrasound negative for hydronephrosis.   Acute encephalopathy:Has steadily improved throughout this hospital course-and now is much more alert and awake t.Suspect intermittent sundowning/delirium in the setting of undiagnosed mild dementia/cognitive dysfunction.Per patient and family last use of EtOH was at least 2-4 months back-therefore do not think has alcohol withdrawal.  Acute CVA: Suspect more of an incidental finding. Has a history of atrial fibrillation-considered a poor candidate for long-term anticoagulation given history of alcohol use and thrombocytopenia.CHA2DS2-VASc score 6.A1c 6.8, LDL 65. Carotid Doppler without significant stenosis.Continue with aspirin and statin.No further recommendations from Neurology  Severe pulmonary hypertension: Started on sildenafil. Follow closely.  ? Abdominal pain:No tenderness of exam, CT abdomen negative.No further work up required  Anemia/thrombocytopenia: Chronic issue, stable for outpatient work up  Minimally  elevated troponins: May be demand ischemia or false positive elevation in the setting of chronic kidney disease. No workup required, continue with aspirin, statin and beta blockers  Atrial fibrillation: See above regarding anticoagulation-rate controlled with Coreg.  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 Chronic small ulceration in the anterior abdominal wall that appears stable  History of right breast cancer: Status post mastectomy, continue tamoxifen.Follows with Dr Lindi Adie  Essential hypertension:controlled, continue Coreg, hydralazine and Imdur. Follow BP   Type 2 diabetes: CBGs stable-with SSI. A1c 6.8. Previously was on oral agents that was discontinued in the past because of hypoglycemia-will continue with SSI as being discharged to a skilled nursing facility-once ready to be discharge from SNF-may need to be started on oral diabetic agents that do not cause hypoglycemia.  Hypothyroidism: Continue with Synthroid  COPD: Continue with bronchodilators-this appears stable  Gout: Continue colchicine-appears stable  CAD status post CABG: Continue aspirin, statins and beta blockers  OSA: Related to obesity-continue CPAP on discharge  Deconditioning: Suspect underlying failure to thrive syndrome-Will likely require SNF on discharge-spoke with daughter on 11/11-she is agreeable  TODAY-DAY OF DISCHARGE:  Subjective:   Treston Horvat today has no headache,no chest abdominal pain,no new weakness tingling or numbness, feels much better wants to go home today.   Objective:   Blood pressure 113/52, pulse 79, temperature 97.7 F (36.5 C), temperature source Oral, resp. rate 19, height 5\' 8"  (1.727 m), weight 101.696 kg (224 lb 3.2 oz), SpO2 97 %.  Intake/Output Summary (Last 24 hours) at 08/22/15 1114 Last data filed at 08/22/15 0819  Gross per 24 hour  Intake    840 ml  Output    1250 ml  Net   -410 ml   Filed Weights   08/20/15 0424 08/21/15 0500 08/22/15 0636  Weight: 105.915 kg (233 lb 8 oz) 101.787 kg (224 lb 6.4 oz) 101.696 kg (224 lb 3.2 oz)    Exam Awake Alert, Oriented *3, No new F.N deficits, Normal affect [place].AT,PERRAL Supple Neck,No JVD, No cervical lymphadenopathy appriciated.  Symmetrical Chest wall movement, Good air movement bilaterally, CTAB RRR,No Gallops,Rubs or new Murmurs, No Parasternal Heave +ve B.Sounds, Abd Soft, Non tender, No organomegaly appriciated, No rebound -guarding or rigidity. No Cyanosis, Clubbing or edema, No new Rash or bruise  DISCHARGE CONDITION: Stable  DISPOSITION: SNF  DISCHARGE INSTRUCTIONS:    Activity:  As tolerated with Full fall precautions use walker/cane & assistance as needed  Get Medicines reviewed and adjusted: Please take all your medications with you for your next visit with your Primary MD  Please request your Primary  MD to go over all hospital tests and procedure/radiological results at the follow up, please ask your Primary MD to get all Hospital records sent to his/her office.  If you experience worsening of your admission symptoms, develop shortness of breath, life threatening emergency, suicidal or homicidal thoughts you must seek medical attention immediately by calling 911 or calling your MD immediately  if symptoms less severe.  You must read complete instructions/literature along with all the possible adverse reactions/side effects for all the Medicines you take and that have been prescribed to you. Take any new Medicines after you have completely understood and accpet all the possible adverse reactions/side effects.   Do not drive when taking Pain medications.   Do not take more than prescribed Pain, Sleep and Anxiety Medications  Special Instructions: If you have smoked or chewed Tobacco  in the last 2 yrs please stop smoking, stop any regular Alcohol  and or any Recreational drug  use.  Wear Seat belts while driving.  Please note  You were cared for by a hospitalist during your hospital stay. Once you are discharged, your primary care physician will handle any further medical issues. Please note that NO REFILLS for any discharge medications will be authorized once you are discharged, as it is imperative that you return to your primary care physician (or establish a relationship with a primary care physician if you do not have one) for your aftercare needs so that they can reassess your need for medications and monitor your lab values.   Diet recommendation: Diabetic Diet Heart Healthy diet Fluid restriction 1.5 lit/day  Discharge Instructions    (HEART FAILURE PATIENTS) Call MD:  Anytime you have any of the following symptoms: 1) 3 pound weight gain in 24 hours or 5 pounds in 1 week 2) shortness of breath, with or without a dry hacking cough 3) swelling in the hands, feet or stomach 4) if you have to sleep on extra pillows at night in order to breathe.    Complete by:  As directed      AMB referral to CHF clinic    Complete by:  As directed      Diet - low sodium heart healthy    Complete by:  As directed      Diet Carb Modified    Complete by:  As directed      Discharge wound care:    Complete by:  As directed   apply bilat Una boots and coban and change Q Tues and Fri.     Heart Failure patients record your daily weight using the same scale at the same time of day    Complete by:  As directed      Increase activity slowly    Complete by:  As directed      STOP any activity that causes chest pain, shortness of breath, dizziness, sweating, or exessive weakness    Complete by:  As directed            Follow-up Information    Follow up with GIBSON,DAVID, MD. Schedule an appointment as soon as possible for a visit in 2 weeks.   Specialty:  Family Medicine   Contact information:   [place]  [id number] [phone]       Follow up with Glori Bickers, MD. Schedule an appointment as soon as possible for a visit in 1 week.   Specialty:  Cardiology   Contact information:  [place] [address] [phone]       Total Time spent on discharge equals  45 minutes.  SignedOren Binet 08/22/2015 11:14 AM

## 2015-08-25 ENCOUNTER — Non-Acute Institutional Stay (SKILLED_NURSING_FACILITY): Payer: Medicare Other | Admitting: Adult Health

## 2015-08-25 ENCOUNTER — Encounter: Payer: Self-pay | Admitting: Adult Health

## 2015-08-25 DIAGNOSIS — E782 Mixed hyperlipidemia: Secondary | ICD-10-CM

## 2015-08-25 DIAGNOSIS — J9601 Acute respiratory failure with hypoxia: Secondary | ICD-10-CM

## 2015-08-25 DIAGNOSIS — J439 Emphysema, unspecified: Secondary | ICD-10-CM

## 2015-08-25 DIAGNOSIS — I480 Paroxysmal atrial fibrillation: Secondary | ICD-10-CM | POA: Diagnosis not present

## 2015-08-25 DIAGNOSIS — C50421 Malignant neoplasm of upper-outer quadrant of right male breast: Secondary | ICD-10-CM

## 2015-08-25 DIAGNOSIS — E1142 Type 2 diabetes mellitus with diabetic polyneuropathy: Secondary | ICD-10-CM | POA: Diagnosis not present

## 2015-08-25 DIAGNOSIS — E1169 Type 2 diabetes mellitus with other specified complication: Secondary | ICD-10-CM | POA: Insufficient documentation

## 2015-08-25 DIAGNOSIS — I110 Hypertensive heart disease with heart failure: Secondary | ICD-10-CM

## 2015-08-25 DIAGNOSIS — I272 Other secondary pulmonary hypertension: Secondary | ICD-10-CM | POA: Diagnosis not present

## 2015-08-25 DIAGNOSIS — I5033 Acute on chronic diastolic (congestive) heart failure: Secondary | ICD-10-CM | POA: Diagnosis not present

## 2015-08-25 DIAGNOSIS — E786 Lipoprotein deficiency: Secondary | ICD-10-CM

## 2015-08-25 DIAGNOSIS — I63419 Cerebral infarction due to embolism of unspecified middle cerebral artery: Secondary | ICD-10-CM

## 2015-08-25 DIAGNOSIS — E034 Atrophy of thyroid (acquired): Secondary | ICD-10-CM

## 2015-08-25 DIAGNOSIS — E038 Other specified hypothyroidism: Secondary | ICD-10-CM

## 2015-08-25 DIAGNOSIS — E781 Pure hyperglyceridemia: Secondary | ICD-10-CM

## 2015-08-25 DIAGNOSIS — I25119 Atherosclerotic heart disease of native coronary artery with unspecified angina pectoris: Secondary | ICD-10-CM

## 2015-08-25 DIAGNOSIS — F1010 Alcohol abuse, uncomplicated: Secondary | ICD-10-CM

## 2015-08-25 NOTE — Progress Notes (Signed)
Patient ID: [name], male   DOB: May 04, 1946, 69 y.o.   MRN: [id number]    Facility: [facility]      Allergies  Allergen Reactions  . Erythromycin Hives    Oramycin    Chief Complaint  Patient presents with  . Hospitalization Follow-up    HPI:  He was hospitalized initially for vomting with syncope. He was found to have aspiration pneumonia which he completed abt; acute on chronic diastolic heart failure; acute hypoxic respiratory failure and an acute cva. He is here for short term rehab with the goal at this time for his to return home. He cannot fully participate in the hpi ros.   Past Medical History  Diagnosis Date  . CHRONIC OBSTRUCTIVE PULMONARY DISEASE 06/20/2009  . CAROTID STENOSIS 06/20/2009    A. 08/2001 s/p L CEA;  B.   09/14/11 - Carotid U/S - 40-59% bilateral stenosis, left CEA patch angioplasty is patent  . DM 06/20/2009  . CAD 06/20/2009    A.  08/2000 - s/p CABG x 4 - LIMA-LAD, Left Radial-OM, VG-DIAG, VG-RCA;  B. Neg. MV  2010  . HYPERLIPIDEMIA 06/20/2009  . HYPERTENSION 06/20/2009  . Hypothyroidism   . Low back pain   . Asthma     as child  . Pneumonia   . Persistent atrial fibrillation (HCC)     Not felt to be coumadin candidate 2/2 ETOH use.  Marland Kitchen History of tobacco abuse     remote - quit 1970  . Morbidly obese ([place])   . Chronic diastolic heart failure, NYHA class 3 ([place])     Followed by CHF Clinic --> Echo 04/2014: EF [id number], Diastolic DysFxn - elevated LVEDP & LAP. Mod Dil LA & RA.  . Falls frequently   . Hx of cardiovascular stress test 05/2014    Lexiscan Myoview (8/15):  No ischemia; EF 63% - Normal Study  . Breast cancer, right breast ([place])   . Gout   . OBSTRUCTIVE SLEEP APNEA 06/20/2009    does not use cpap  . Stroke [facility])     found on MRI in Oct. 2015.   Marland Kitchen Shortness of breath dyspnea     occasional  . Depression     takes Prozac  . GERD (gastroesophageal reflux disease)     takes Prilosec  . Arthritis   . Neuromuscular disorder (HCC)      neuropathy in both legs  . Constipation   . ETOH abuse     no alcohol since Oct. 2015  . 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 CHF (congestive heart failure) ([place])   . Supplemental oxygen dependent     hs  . Bilateral renal cysts   . Kidney disease     Stage 3  . Pulmonary HTN ([place])     a. [place] 11/16: severe pulmonary HTN, PASP 57, PA sat 42%, Ao sat 90%, CO 4.2 L/min, CI 1.88, PCWP mean 24, RA mean 14    Past Surgical History  Procedure Laterality Date  . Carotid endarterectomy  2002    left  . Coronary artery bypass graft      x 4 - 2001  . Umbilical hernia repair N/A 01/28/2014    Procedure: HERNIA REPAIR UMBILICAL ADULT/INC;  Surgeon: Harl Bowie, MD;  Location: [place];  Service: General;  Laterality: N/A;  . Laparotomy N/A 01/28/2014    Procedure: EXPLORATORY LAPAROTOMY;  Surgeon: Harl Bowie,  MD;  Location: [place];  Service: General;  Laterality: N/A;  . Bowel resection N/A 01/28/2014    Procedure: SMALL BOWEL RESECTION;  Surgeon: Harl Bowie, MD;  Location: [place];  Service: General;  Laterality: N/A;  . Hernia repair    . Colonoscopy    . Total mastectomy Right 11/11/2014    Procedure: RIGHT TOTAL MASTECTOMY;  Surgeon: Excell Seltzer, MD;  Location: [place];  Service: General;  Laterality: Right;  . Cataract extraction w/phaco Left 04/08/2015    Procedure: CATARACT EXTRACTION PHACO AND INTRAOCULAR LENS PLACEMENT ([place]);  Surgeon: Birder Robson, MD;  Location: [facility];  Service: Ophthalmology;  Laterality: Left;  [place] 00:36 AP% 23.7 CDE 8.72 fluid pack [id number]  . Eye surgery Left     Catarct  . Laparotomy N/A 06/02/2015    Procedure: EXPLORATORY LAPAROTOMY;  Surgeon: Coralie Keens, MD;  Location: [place];  Service: General;  Laterality: N/A;  . Bowel resection N/A 06/02/2015    Procedure: SMALL BOWEL RESECTION;  Surgeon: Coralie Keens, MD;  Location: [place];  Service: General;  Laterality:  N/A;  . Cardiac catheterization N/A 08/15/2015    Procedure: Right Heart Cath;  Surgeon: Jettie Booze, MD;  Location: [place] [facility];  Service: Cardiovascular;  Laterality: N/A;    VITAL SIGNS BP 142/74 mmHg  Pulse 76  Resp 18  Ht 5\' 7"  (1.702 m)  Wt 227 lb 9.6 oz (103.239 kg)  BMI 35.64 kg/m2  SpO2 95%  Patient's Medications  New Prescriptions   No medications on file  Previous Medications   ALBUTEROL (PROVENTIL) (2.5 MG/3ML) 0.083% NEBULIZER SOLUTION    Take 3 mLs (2.5 mg total) by nebulization every 2 (two) hours as needed for wheezing or shortness of breath.   ALLOPURINOL (ZYLOPRIM) 300 MG TABLET    Take 1 tablet (300 mg total) by mouth daily.   ASPIRIN EC 325 MG EC TABLET    Take 1 tablet (325 mg total) by mouth daily.   ATORVASTATIN (LIPITOR) 20 MG TABLET    Take 1 tablet (20 mg total) by mouth daily.   CARVEDILOL (COREG) 3.125 MG TABLET    Take 2 tablets (6.25 mg total) by mouth 2 (two) times daily with a meal.   COLCHICINE 0.6 MG TABLET    Take 0.6 mg by mouth daily.   FLUOXETINE (PROZAC) 20 MG CAPSULE    Take 1 capsule (20 mg total) by mouth daily.   FLUTICASONE (FLOVENT HFA) 110 MCG/ACT INHALER    Inhale 2 puffs into the lungs 2 (two) times daily.   FOLIC ACID (FOLVITE) 1 MG TABLET    Take 1 tablet (1 mg total) by mouth daily.   FUROSEMIDE (LASIX) 40 MG TABLET    Take 2 tablets (80 mg total) by mouth 2 (two) times daily. Pt take 120 mg in AM and 80 mg in PM   GABAPENTIN (NEURONTIN) 300 MG CAPSULE    Take 1 capsule (300 mg total) by mouth 2 (two) times daily.   HYDRALAZINE (APRESOLINE) 100 MG TABLET    Take 1 tablet (100 mg total) by mouth every 8 (eight) hours.   INSULIN ASPART (NOVOLOG) 100 UNIT/ML INJECTION    0-9 Units, Subcutaneous, 3 times daily with meals, First dose on Tue 08/12/15 at 0800 Correction coverage: Sensitive (thin, NPO, renal) CBG < 70: implement hypoglycemia protocol CBG 70 - 120: 0 units CBG 121 - 150: 1 unit CBG 151 - 200: 2 units CBG 201 -  250: 3 units CBG  251 - 300: 5 units CBG 301 - 350: 7 units CBG 351 - 400: 9 units CBG > 400: call MD   IPRATROPIUM-ALBUTEROL (DUONEB) 0.5-2.5 (3) MG/3ML SOLN    Take 3 mLs by nebulization 2 (two) times daily.   ISOSORBIDE DINITRATE (ISORDIL) 10 MG TABLET    Take 1 tablet (10 mg total) by mouth 3 (three) times daily.   LEVOTHYROXINE (SYNTHROID, LEVOTHROID) 125 MCG TABLET    Take 1.5 tablets (187 mcg total) by mouth daily before breakfast.   MAGNESIUM OXIDE (MAG-OX) 400 (241.3 MG) MG TABLET    Take 1 tablet (400 mg total) by mouth 2 (two) times daily.   OMEPRAZOLE (PRILOSEC) 20 MG CAPSULE    Take 1 capsule (20 mg total) by mouth daily.   OXYCODONE HCL 10 MG TABS    Take 1 tablet (10 mg total) by mouth 3 (three) times daily as needed (pain).   POTASSIUM CHLORIDE SA (KLOR-CON M20) 20 MEQ TABLET    Take 1 tablet (20 mEq total) by mouth 2 (two) times daily.   SILDENAFIL (REVATIO) 20 MG TABLET    Take 1 tablet (20 mg total) by mouth 3 (three) times daily.   TAMOXIFEN (NOLVADEX) 20 MG TABLET    Take 1 tablet (20 mg total) by mouth daily.   VENLAFAXINE XR (EFFEXOR-XR) 37.5 MG 24 HR CAPSULE    TAKE ONE CAPSULE BY MOUTH DAILY  Modified Medications   No medications on file  Discontinued Medications   No medications on file     SIGNIFICANT DIAGNOSTIC EXAMS  08-11-15: chest x-ray; 1. Atelectasis at the right lung base. Superimposed aspiration is unlikely based on the current appearance. 2. Stable cardiomegaly and mild chronic interstitial lung disease with interval mild pulmonary vascular congestion.    08-11-15: ct of head and cervical spine: 1. Mild left parietal scalp soft tissue swelling. No acute intracranial abnormality. 2. Advanced cervical spondylosis. Trace C3-4 is favored to be degenerative. Otherwise, no acute findings in the cervical spine identified. 3.  Cerebral atrophy and small vessel ischemic change.  08-11-15: ct angio of chest: 1. Negative for acute pulmonary embolism 2.  Confluent airspace opacities bilaterally, right worse than left. Infectious infiltrate is a leading consideration. Alveolar edema less likely but not excluded. There also are mildly prominent hilar and mediastinal nodes which may be reactive.   08-12-15: acute abdomen and chest x-ray: 1. Patchy airspace opacities bilaterally consistent with pneumonia. A small amount of pleural fluid is likely present on the right. 2. CHF with mild pulmonary vascular congestion. 3. No acute intra-abdominal abnormality is observed. Increased colonic stool burden may reflect constipation in the appropriate clinical setting.  08-12-15: mri of brain:1. Question 4 mm punctate focus of high signal intensity on DWI sequence within the posterior right frontal lobe. Finding favored to reflect artifact, although possible tiny acute ischemic infarct is not entirely excluded, and could be considered in the correct clinical setting. 2. No other acute intracranial process. 3. 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 4. Abnormal flow void within the distal left vertebral artery, which may be related to focal plaque or possibly chronic dissection. This is similar relative to prior study. 5. Age-related cerebral atrophy with mild to moderate chronic small vessel ischemic disease.  08-14-15: renal ultrasound: 1. Technically limited study secondary to patient body habitus and diffuse soft tissue edema. 2. The right kidney is very poorly seen.  No obvious hydronephrosis. 3. No left-sided hydronephrosis. 4. Simple renal cysts on the  left.  08-15-15: right heart cath:    08-15-15: right heart cath: Severe pulmonary artery hypertension. Mean PA pressure 57 mm Hg.  PA sat 42%. Ao sat 90%. Cardiac output 4.2 L/min; Cardiac Index 1.88.  Mean PCWP: 24 mm Hg. mean RA pressure 14 mm Hg.   Medical therapy for his severe lung disease and associate pulmonary hypertension.     08-15-15: chest  x-ray; 1. Slight improvement in density of RIGHT lower lobe atelectasis versus infiltrate. 2. No change in perihilar opacities representing mild edema or infection  08-16-15: TEE: - Left ventricle: The cavity size was normal. Wall thickness was increased in a pattern of mild LVH. Systolic function was normal. The estimated ejection fraction was in the range of 55% to 60%. - Aortic valve: Valve mobility was restricted. There was mild  stenosis.  - Mitral valve: Valve area by continuity equation  - Left atrium: The atrium was severely dilated. - Right ventricle: The cavity size was moderately dilated. Systolic function was mildly to moderately reduced. - Right atrium: The atrium was severely dilated. - Pulmonary arteries: Systolic pressure was severely increased. PA peak pressure: 82 mm Hg (S).  08-17-15: ct of abdomen and pelvis: 1. Small bilateral pleural effusions and overlying atelectasis. 2. Advanced three-vessel coronary artery calcifications and moderate atherosclerotic calcification involving the thoracic and abdominal aorta and branch vessels. 3. Numerous bilateral renal cysts appear stable. 4. No acute abdominal/pelvic findings, mass lesions or adenopathy. 5. Mild uniform bladder wall thickening without discrete mass. 6. Severe and progressive degenerative changes in the lower lumbar spine, particularly at L4-5.     LABS REVIEWED:   08-11-15: wbc 6.1; hgb 10.9; hct 37.1; mcv 101.9; plt 93; glucose 221; bun 41; creat 1.56; k+ 4.8; na++136; BNP 299.1 08-12-15: wbc 9.4; hgb 11.2; hct 37.5; mcv 99.7; plt 106; HIV: nr; tsh 0.976; vitamin B1: 146.3; ammonia 18; blood culture: no growth 08-13-15: glucose 155; bun 39; creat 1.67; k+ 3.9; na++135; hgb a1c 6.8 08-14-15; glucose 176; bun 47; creat 2.09; k+ 4.6; na++135; chol 108; ldl 65; trig 87; hdl 26; BNP 584.6 08-17-15: glucose 173; bun 39; creat 1.94; k+ 3.7; na++140; liver normal albumin 2.7; ammonia 41 08-21-15: glucose 151; bun 31; creat  1.41; k+ 4.0; na++137; liver normal albumin 2.8        Review of Systems  Unable to perform ROS: other      Physical Exam  Constitutional: No distress.  Obese   Eyes: Conjunctivae are normal.  Neck: Neck supple. No JVD present. No thyromegaly present.  Cardiovascular: Normal rate, regular rhythm and intact distal pulses.   Respiratory: Effort normal and breath sounds normal. No respiratory distress. He has no wheezes.  GI: Soft. Bowel sounds are normal. He exhibits no distension. There is no tenderness.  Musculoskeletal: He exhibits no edema.  Able to move all extremities   Lymphadenopathy:    He has no cervical adenopathy.  Neurological: He is alert.  Skin: Skin is warm and dry. He is not diaphoretic.  Has bilateral unna boots in place  On abdomen has scabbed area present on old incision line   Psychiatric: He has a normal mood and affect.       ASSESSMENT/ PLAN:  1. Acute on chronic diastolic heart failure: EF is 55-60%; will continue daily weights; lasix 120 mg in the AM and 80 mg in the PM with k+ 20 meq twice daily will continue coreg 3.125 mg twice daily hydralazine 100 mg every 8 hours will monitor  2. Paroxysmal atrial fib: heart rate is stable will continue asa 325 mg daily takes coreg 3.125 mg twice daily will monitor  3. Acute respiratory failure with hypoxia: will albuterol neb every 2 hours as needed; flovent 2 puffs twice daily duoneb twice daily; will off 02 to keep sat >91%  4. Gout: no recent flares: will continue allopurinol 300 mg daily and colchicine 0.6 mg daily will monitor  5. Diabetes: will continue novolog SSI: his hgb a1c is 6.8  6. Dyslipidemia: will continue lipitor 20 mg daily his ldl is 65  7. Hypothyroidism: will continue 187 mcg daily his tsh is 0.976  8. Peripheral neuropathy: will continue neurontin 300 mg twice daily   9. CAD: Is status post cabg X 4 vessels: recent right heart cath: no complaints of chest pain present; will continue  isordil 10 mg three times daily   10 . GERD: will continue prilosec 20 mg daily   11. Pulmonary hypertension: will continue revatio 20 mg three times daily  The TEE on 08-16-15; demonstrated pulmonary artery pressure of 82 mm Hg  12. Male breast cancer status post right mastectomy Feb 2016; will continue tamoxifen 20 mg daily   13. Depression will continue prozac 20 mg daily with effexor xr 37.5 mg daily   14. Hypomagnesemia: will continue mag ox 400 mg twice daily   15. Acute CVA: is neurologically stable will continue asa 325 mg daily   16. Alcohol abuse: has not consumed alcohol in the past 2-3 months will monitor     Time spent with patient  60  minutes >50% time spent counseling; reviewing medical record; tests; labs; and developing future plan of care     Ok Edwards NP [facility] Adult Medicine  Contact [phone] Monday through Friday 8am- 5pm  After hours call [phone]

## 2015-08-26 ENCOUNTER — Non-Acute Institutional Stay (SKILLED_NURSING_FACILITY): Payer: Medicare Other | Admitting: Internal Medicine

## 2015-08-26 ENCOUNTER — Encounter: Payer: Self-pay | Admitting: Internal Medicine

## 2015-08-26 DIAGNOSIS — I272 Other secondary pulmonary hypertension: Secondary | ICD-10-CM | POA: Diagnosis not present

## 2015-08-26 DIAGNOSIS — E038 Other specified hypothyroidism: Secondary | ICD-10-CM

## 2015-08-26 DIAGNOSIS — J439 Emphysema, unspecified: Secondary | ICD-10-CM

## 2015-08-26 DIAGNOSIS — Z8673 Personal history of transient ischemic attack (TIA), and cerebral infarction without residual deficits: Secondary | ICD-10-CM

## 2015-08-26 DIAGNOSIS — I480 Paroxysmal atrial fibrillation: Secondary | ICD-10-CM | POA: Diagnosis not present

## 2015-08-26 DIAGNOSIS — I5032 Chronic diastolic (congestive) heart failure: Secondary | ICD-10-CM

## 2015-08-26 DIAGNOSIS — I25119 Atherosclerotic heart disease of native coronary artery with unspecified angina pectoris: Secondary | ICD-10-CM

## 2015-08-26 DIAGNOSIS — C50421 Malignant neoplasm of upper-outer quadrant of right male breast: Secondary | ICD-10-CM | POA: Diagnosis not present

## 2015-08-26 DIAGNOSIS — G4733 Obstructive sleep apnea (adult) (pediatric): Secondary | ICD-10-CM | POA: Diagnosis not present

## 2015-08-26 DIAGNOSIS — E034 Atrophy of thyroid (acquired): Secondary | ICD-10-CM | POA: Diagnosis not present

## 2015-08-26 DIAGNOSIS — E1142 Type 2 diabetes mellitus with diabetic polyneuropathy: Secondary | ICD-10-CM | POA: Diagnosis not present

## 2015-08-26 NOTE — Progress Notes (Signed)
Patient ID: [name], male   DOB: 07-08-1946, 69 y.o.   MRN: [id number]    HISTORY AND PHYSICAL   DATE: 08/26/15  Location:  [facility]    Place of Service: SNF [phone])   Extended Emergency Contact Information Primary Emergency Contact: Hardy,[name] Address: [address]          [place], [place] [address] Johnnette Litter of [place] Phone: [phone] Mobile Phone: [phone] Relation: Daughter  Advanced Directive information    Chief Complaint  Patient presents with  . New Admit To SNF    HPI:  69 yo male seen today as a new admission into SNF following hospital stay for acute respiratory failure with hypoxia, AKI, A/C diastolic HF, severe pulmonary HTN, hx CVA, PAF, CAD s/p CABG hx, DM2, COPD, PAD, hx enterocutaneous fistula. He presented with syncopal episode. He underwent right heart cath that showed severe pulmonary HTN. He was aggressively diuresed  And weight went from 253 lb -->224 lb at d/c. He completed IV abx for suspected aspiration pneumonia. EEG neg. He was started on sildenafil. Followed by cardio. A1c 6.8%. He was transferred to SNF for short term rehab  He has no c/o today. No nursing issues. No falls. He is a poor historian due to confusion. Hx obtained from chart  chronic diastolic heart failure - recent TEE shows EF is 55-60%. Takes  lasix 120 mg in the AM and 80 mg in the PM with k+ 20 meq twice daily; coreg 3.125 mg twice daily;  hydralazine 100 mg every 8 hours   PAF - heart rate controlled with coreg3.125 mg twice daily. takes ASA 325 mg daily   Gout - no recent gout flares. Takes allopurinol 300 mg daily and colchicine 0.6 mg daily  DM - controlled with A1c 6.8%. Takes novolog SSI. Neuropathy stable on neurontin 300 mg twice daily. CBG 200 today  Hyperlipidemia - stable on lipitor 20 mg daily. LDL 65  Hypothyroidism - stable on levothyroxine 187 mcg daily. TSH 0.976  CAD - s/p 4 vessel CABG. No CP. Takes isordil 10 mg three  times daily   GERD - stable on prilosec 20 mg daily   Severe pulmonary hypertension/hypoxia- currently on revatio 20 mg three times daily. TEE during admission revealed pulmonary artery pressure of 82 mm Hg. [place] O2 tapering off. Uses albuterol neb every 2 hours as needed; flovent 2 puffs twice daily duoneb twice daily;  Hx  Male breast cancer- s/p right mastectomy Feb 2016. Currently takes tamoxifen 20 mg daily   Depression - mood stable on prozac 20 mg daily and effexor xr 37.5 mg daily   Hypomagnesemia - stable on mag ox 400 mg twice daily   Hx CVA - stable on ASA 325 mg daily   hx Alcohol abuse - stable. He has not consumed alcohol in the past 2-3 months   Past Medical History  Diagnosis Date  . CHRONIC OBSTRUCTIVE PULMONARY DISEASE 06/20/2009  . CAROTID STENOSIS 06/20/2009    A. 08/2001 s/p L CEA;  B.   09/14/11 - Carotid U/S - 40-59% bilateral stenosis, left CEA patch angioplasty is patent  . DM 06/20/2009  . CAD 06/20/2009    A.  08/2000 - s/p CABG x 4 - LIMA-LAD, Left Radial-OM, VG-DIAG, VG-RCA;  B. Neg. MV  2010  . HYPERLIPIDEMIA 06/20/2009  . HYPERTENSION 06/20/2009  . Hypothyroidism   . Low back pain   . Asthma     as child  . Pneumonia   .  Persistent atrial fibrillation (HCC)     Not felt to be coumadin candidate 2/2 ETOH use.  Marland Kitchen History of tobacco abuse     remote - quit 1970  . Morbidly obese ([place])   . Chronic diastolic heart failure, NYHA class 3 ([place])     Followed by [facility] Clinic --> Echo 04/2014: EF 16-01%, Diastolic DysFxn - elevated LVEDP & LAP. Mod Dil LA & RA.  . Falls frequently   . Hx of cardiovascular stress test 05/2014    Lexiscan Myoview (8/15):  No ischemia; EF 63% - Normal Study  . Breast cancer, right breast ([place])   . Gout   . OBSTRUCTIVE SLEEP APNEA 06/20/2009    does not use cpap  . Stroke [facility])     found on MRI in Oct. 2015.   Marland Kitchen Shortness of breath dyspnea     occasional  . Depression     takes Prozac  . GERD (gastroesophageal reflux disease)      takes Prilosec  . Arthritis   . Neuromuscular disorder (HCC)     neuropathy in both legs  . Constipation   . ETOH abuse     no alcohol since Oct. 2015  . 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 CHF (congestive heart failure) ([place])   . Supplemental oxygen dependent     hs  . Bilateral renal cysts   . Kidney disease     Stage 3  . Pulmonary HTN ([place])     a. [place] 11/16: severe pulmonary HTN, PASP 57, PA sat 42%, Ao sat 90%, CO 4.2 L/min, CI 1.88, PCWP mean 24, RA mean 14    Past Surgical History  Procedure Laterality Date  . Carotid endarterectomy  2002    left  . Coronary artery bypass graft      x 4 - 2001  . Umbilical hernia repair N/A 01/28/2014    Procedure: HERNIA REPAIR UMBILICAL ADULT/INC;  Surgeon: Harl Bowie, MD;  Location: [place];  Service: General;  Laterality: N/A;  . Laparotomy N/A 01/28/2014    Procedure: EXPLORATORY LAPAROTOMY;  Surgeon: Harl Bowie, MD;  Location: [place];  Service: General;  Laterality: N/A;  . Bowel resection N/A 01/28/2014    Procedure: SMALL BOWEL RESECTION;  Surgeon: Harl Bowie, MD;  Location: [place];  Service: General;  Laterality: N/A;  . Hernia repair    . Colonoscopy    . Total mastectomy Right 11/11/2014    Procedure: RIGHT TOTAL MASTECTOMY;  Surgeon: Excell Seltzer, MD;  Location: [place];  Service: General;  Laterality: Right;  . Cataract extraction w/phaco Left 04/08/2015    Procedure: CATARACT EXTRACTION PHACO AND INTRAOCULAR LENS PLACEMENT ([place]);  Surgeon: Birder Robson, MD;  Location: [facility];  Service: Ophthalmology;  Laterality: Left;  [place] 00:36 AP% 23.7 CDE 8.72 fluid pack UXN#[id number] H  . Eye surgery Left     Catarct  . Laparotomy N/A 06/02/2015    Procedure: EXPLORATORY LAPAROTOMY;  Surgeon: Coralie Keens, MD;  Location: [place];  Service: General;  Laterality: N/A;  . Bowel resection N/A 06/02/2015    Procedure: SMALL BOWEL RESECTION;  Surgeon: Coralie Keens, MD;  Location: [place];  Service: General;  Laterality: N/A;  . Cardiac catheterization N/A 08/15/2015    Procedure: Right Heart Cath;  Surgeon: Jettie Booze, MD;  Location: [place] [facility];  Service: Cardiovascular;  Laterality: N/A;    Patient Care Team: Shanon Brow  Noberto Retort, MD as PCP - General (Family Medicine)  Social History   Social History  . Marital Status: Single    Spouse Name: N/A  . Number of Children: N/A  . Years of Education: N/A   Occupational History  . retired    Social History Main Topics  . Smoking status: Former Smoker -- 1.00 packs/day for 16 years    Quit date: 10/11/1968  . Smokeless tobacco: Never Used  . Alcohol Use: Yes     Comment: last drink was 2 weeks - 8?16  . Drug Use: Yes    Special: Marijuana     Comment: last 1 year ago  . Sexual Activity: No   Other Topics Concern  . Not on file   Social History Narrative   Lives in [place] with dtr, son-in-law     reports that he quit smoking about 46 years ago. He has never used smokeless tobacco. He reports that he drinks alcohol. He reports that he uses illicit drugs (Marijuana).  Family History  Problem Relation Age of Onset  . CVA Mother     ?  Marland Kitchen Cancer Mother     unknown type of cancer  . CVA Father     ?  Marland Kitchen Heart attack Neg Hx   . Stomach cancer Brother 34    stomach cancer   Family Status  Relation Status Death Age  . Mother Deceased     ? cva  . Father Deceased     ? cva  . Brother Alive   . Sister Alive   . Brother Deceased   . Daughter Alive   . Maternal Grandmother Deceased   . Maternal Grandfather Deceased   . Paternal Grandmother Deceased   . Paternal Grandfather Deceased     Immunization History  Administered Date(s) Administered  . Influenza,inj,Quad PF,36+ Mos 07/25/2014  . Pneumococcal Conjugate-13 10/07/2011    Allergies  Allergen Reactions  . Erythromycin Hives    Oramycin    Medications: Patient's Medications  New Prescriptions    No medications on file  Previous Medications   ALBUTEROL (PROVENTIL) (2.5 MG/3ML) 0.083% NEBULIZER SOLUTION    Take 3 mLs (2.5 mg total) by nebulization every 2 (two) hours as needed for wheezing or shortness of breath.   ALLOPURINOL (ZYLOPRIM) 300 MG TABLET    Take 1 tablet (300 mg total) by mouth daily.   ASPIRIN EC 325 MG EC TABLET    Take 1 tablet (325 mg total) by mouth daily.   ATORVASTATIN (LIPITOR) 20 MG TABLET    Take 1 tablet (20 mg total) by mouth daily.   CARVEDILOL (COREG) 3.125 MG TABLET    Take 2 tablets (6.25 mg total) by mouth 2 (two) times daily with a meal.   COLCHICINE 0.6 MG TABLET    Take 0.6 mg by mouth daily.   FLUOXETINE (PROZAC) 20 MG CAPSULE    Take 1 capsule (20 mg total) by mouth daily.   FLUTICASONE (FLOVENT HFA) 110 MCG/ACT INHALER    Inhale 2 puffs into the lungs 2 (two) times daily.   FOLIC ACID (FOLVITE) 1 MG TABLET    Take 1 tablet (1 mg total) by mouth daily.   FUROSEMIDE (LASIX) 40 MG TABLET    Take 2 tablets (80 mg total) by mouth 2 (two) times daily. Pt take 120 mg in AM and 80 mg in PM   GABAPENTIN (NEURONTIN) 300 MG CAPSULE    Take 1 capsule (300 mg total) by mouth 2 (  two) times daily.   HYDRALAZINE (APRESOLINE) 100 MG TABLET    Take 1 tablet (100 mg total) by mouth every 8 (eight) hours.   INSULIN ASPART (NOVOLOG) 100 UNIT/ML INJECTION    0-9 Units, Subcutaneous, 3 times daily with meals, First dose on Tue 08/12/15 at 0800 Correction coverage: Sensitive (thin, NPO, renal) CBG < 70: implement hypoglycemia protocol CBG 70 - 120: 0 units CBG 121 - 150: 1 unit CBG 151 - 200: 2 units CBG 201 - 250: 3 units CBG 251 - 300: 5 units CBG 301 - 350: 7 units CBG 351 - 400: 9 units CBG > 400: call MD   IPRATROPIUM-ALBUTEROL (DUONEB) 0.5-2.5 (3) MG/3ML SOLN    Take 3 mLs by nebulization 2 (two) times daily.   ISOSORBIDE DINITRATE (ISORDIL) 10 MG TABLET    Take 1 tablet (10 mg total) by mouth 3 (three) times daily.   LEVOTHYROXINE (SYNTHROID, LEVOTHROID) 125 MCG  TABLET    Take 1.5 tablets (187 mcg total) by mouth daily before breakfast.   MAGNESIUM OXIDE (MAG-OX) 400 (241.3 MG) MG TABLET    Take 1 tablet (400 mg total) by mouth 2 (two) times daily.   OMEPRAZOLE (PRILOSEC) 20 MG CAPSULE    Take 1 capsule (20 mg total) by mouth daily.   OXYCODONE HCL 10 MG TABS    Take 1 tablet (10 mg total) by mouth 3 (three) times daily as needed (pain).   POTASSIUM CHLORIDE SA (KLOR-CON M20) 20 MEQ TABLET    Take 1 tablet (20 mEq total) by mouth 2 (two) times daily.   SILDENAFIL (REVATIO) 20 MG TABLET    Take 1 tablet (20 mg total) by mouth 3 (three) times daily.   TAMOXIFEN (NOLVADEX) 20 MG TABLET    Take 1 tablet (20 mg total) by mouth daily.   VENLAFAXINE XR (EFFEXOR-XR) 37.5 MG 24 HR CAPSULE    TAKE ONE CAPSULE BY MOUTH DAILY  Modified Medications   No medications on file  Discontinued Medications   No medications on file    Review of Systems  Unable to perform ROS: Other  confused  Filed Vitals:   08/26/15 1114  BP: 132/73  Pulse: 70  Temp: 97.1 F (36.2 C)  Weight: 227 lb 9.6 oz (103.239 kg)  SpO2: 97%   Body mass index is 35.64 kg/(m^2).  Physical Exam  Constitutional: He appears well-developed and well-nourished.  Sitting in chair in NAD  HENT:  Mouth/Throat: Oropharynx is clear and moist.  Eyes: Pupils are equal, round, and reactive to light. No scleral icterus.  Neck: Neck supple. Carotid bruit is not present. No thyromegaly present.  Cardiovascular: Normal rate, regular rhythm and intact distal pulses.  Exam reveals gallop and S3. Exam reveals no friction rub.   Murmur (1/6 SEM) heard.  Systolic murmur is present with a grade of 1/6  +1 pitting LE edema b/l. No calf TTP  Pulmonary/Chest: Effort normal. He has no wheezes. He has rales (left base). He exhibits no tenderness.  Abdominal: Soft. Bowel sounds are normal. He exhibits no distension, no abdominal bruit, no pulsatile midline mass and no mass. There is no tenderness. There is no  rebound and no guarding.  Lymphadenopathy:    He has no cervical adenopathy.  Neurological: He is alert.  Skin: Skin is warm and dry. No rash noted.  Psychiatric: He has a normal mood and affect. His behavior is normal.     Labs reviewed: Admission on 08/11/2015, Discharged on 08/22/2015  No results displayed  because visit has over 200 results.    Office Visit on 07/22/2015  Component Date Value Ref Range Status  . Sodium 07/22/2015 134* 135 - 146 mmol/L Final  . Potassium 07/22/2015 4.2  3.5 - 5.3 mmol/L Final  . Chloride 07/22/2015 90* 98 - 110 mmol/L Final  . CO2 07/22/2015 34* 20 - 31 mmol/L Final  . Glucose, Bld 07/22/2015 222* 65 - 99 mg/dL Final  . BUN 07/22/2015 59* 7 - 25 mg/dL Final  . Creat 07/22/2015 1.34* 0.70 - 1.18 mg/dL Final  . Calcium 07/22/2015 9.0  8.6 - 10.3 mg/dL Final  Office Visit on 07/15/2015  Component Date Value Ref Range Status  . Brain Natriuretic Peptide 07/15/2015 208.8* 0.0 - 100.0 pg/mL Final  . Sodium 07/15/2015 139  135 - 146 mmol/L Final  . Potassium 07/15/2015 4.7  3.5 - 5.3 mmol/L Final  . Chloride 07/15/2015 95* 98 - 110 mmol/L Final  . CO2 07/15/2015 32* 20 - 31 mmol/L Final  . Glucose, Bld 07/15/2015 132* 65 - 99 mg/dL Final  . BUN 07/15/2015 51* 7 - 25 mg/dL Final  . Creat 07/15/2015 1.73* 0.70 - 1.18 mg/dL Final  . Calcium 07/15/2015 9.0  8.6 - 10.3 mg/dL Final  Admission on 07/01/2015, Discharged on 07/07/2015  No results displayed because visit has over 200 results.    Office Visit on 06/25/2015  Component Date Value Ref Range Status  . Sodium 06/25/2015 138  135 - 145 mEq/L Final  . Potassium 06/25/2015 4.9  3.5 - 5.1 mEq/L Final  . Chloride 06/25/2015 99  96 - 112 mEq/L Final  . CO2 06/25/2015 33* 19 - 32 mEq/L Final  . Glucose, Bld 06/25/2015 98  70 - 99 mg/dL Final  . BUN 06/25/2015 50* 6 - 23 mg/dL Final  . Creatinine, Ser 06/25/2015 1.42  0.40 - 1.50 mg/dL Final  . Calcium 06/25/2015 9.2  8.4 - 10.5 mg/dL Final  . GFR  06/25/2015 52.15* >60.00 mL/min Final  . Magnesium 06/25/2015 1.5  1.5 - 2.5 mg/dL Final  Admission on 06/09/2015, Discharged on 06/18/2015  No results displayed because visit has over 200 results.    Admission on 06/02/2015, Discharged on 06/09/2015  Component Date Value Ref Range Status  . Sodium 06/02/2015 135  135 - 145 mmol/L Final  . Potassium 06/02/2015 5.4* 3.5 - 5.1 mmol/L Final  . Chloride 06/02/2015 94* 101 - 111 mmol/L Final  . CO2 06/02/2015 32  22 - 32 mmol/L Final  . Glucose, Bld 06/02/2015 195* 65 - 99 mg/dL Final  . BUN 06/02/2015 37* 6 - 20 mg/dL Final  . Creatinine, Ser 06/02/2015 0.83  0.61 - 1.24 mg/dL Final  . Calcium 06/02/2015 9.1  8.9 - 10.3 mg/dL Final  . GFR calc non Af Amer 06/02/2015 >60  >60 mL/min Final  . GFR calc Af Amer 06/02/2015 >60  >60 mL/min Final   Comment: (NOTE) The eGFR has been calculated using the CKD EPI equation. This calculation has not been validated in all clinical situations. eGFR's persistently <60 mL/min signify possible Chronic Kidney Disease.   . Anion gap 06/02/2015 9  5 - 15 Final  . WBC 06/02/2015 11.6* 4.0 - 10.5 K/uL Final  . RBC 06/02/2015 4.30  4.22 - 5.81 MIL/uL Final  . Hemoglobin 06/02/2015 13.4  13.0 - 17.0 g/dL Final  . HCT 06/02/2015 42.9  39.0 - 52.0 % Final  . MCV 06/02/2015 99.8  78.0 - 100.0 fL Final  . MCH 06/02/2015 31.2  26.0 -  34.0 pg Final  . MCHC 06/02/2015 31.2  30.0 - 36.0 g/dL Final  . RDW 06/02/2015 14.9  11.5 - 15.5 % Final  . Platelets 06/02/2015 167  150 - 400 K/uL Final  . ABO/RH(D) 06/02/2015 A POS   Final  . Antibody Screen 06/02/2015 NEG   Final  . Sample Expiration 06/02/2015 06/05/2015   Final  . Total Protein 06/02/2015 6.5  6.5 - 8.1 g/dL Final  . Albumin 06/02/2015 2.8* 3.5 - 5.0 g/dL Final  . AST 06/02/2015 22  15 - 41 U/L Final  . ALT 06/02/2015 16* 17 - 63 U/L Final  . Alkaline Phosphatase 06/02/2015 55  38 - 126 U/L Final  . Total Bilirubin 06/02/2015 0.8  0.3 - 1.2 mg/dL Final    . Bilirubin, Direct 06/02/2015 0.2  0.1 - 0.5 mg/dL Final  . Indirect Bilirubin 06/02/2015 0.6  0.3 - 0.9 mg/dL Final  . Magnesium 06/02/2015 1.7  1.7 - 2.4 mg/dL Final  . Glucose-Capillary 06/02/2015 194* 65 - 99 mg/dL Final  . Comment 1 06/02/2015 Notify RN   Final  . Comment 2 06/02/2015 Document in Chart   Final  . ABO/RH(D) 06/02/2015 A POS   Final  . Glucose-Capillary 06/02/2015 130* 65 - 99 mg/dL Final  . Glucose-Capillary 06/02/2015 172* 65 - 99 mg/dL Final  . Comment 1 06/02/2015 Notify RN   Final  . MRSA by PCR 06/02/2015 NEGATIVE  NEGATIVE Final   Comment:        The GeneXpert MRSA Assay (FDA approved for NASAL specimens only), is one component of a comprehensive MRSA colonization surveillance program. It is not intended to diagnose MRSA infection nor to guide or monitor treatment for MRSA infections.   . WBC 06/03/2015 12.7* 4.0 - 10.5 K/uL Final  . RBC 06/03/2015 4.28  4.22 - 5.81 MIL/uL Final  . Hemoglobin 06/03/2015 12.3* 13.0 - 17.0 g/dL Final  . HCT 06/03/2015 43.9  39.0 - 52.0 % Final  . MCV 06/03/2015 102.6* 78.0 - 100.0 fL Final  . MCH 06/03/2015 28.7  26.0 - 34.0 pg Final  . MCHC 06/03/2015 28.0* 30.0 - 36.0 g/dL Final  . RDW 06/03/2015 15.3  11.5 - 15.5 % Final  . Platelets 06/03/2015 138* 150 - 400 K/uL Final  . Glucose-Capillary 06/02/2015 198* 65 - 99 mg/dL Final  . Glucose-Capillary 06/03/2015 162* 65 - 99 mg/dL Final  . Glucose-Capillary 06/03/2015 119* 65 - 99 mg/dL Final  . Sodium 06/03/2015 144  135 - 145 mmol/L Final   DELTA CHECK NOTED  . Potassium 06/03/2015 4.6  3.5 - 5.1 mmol/L Final  . Chloride 06/03/2015 103  101 - 111 mmol/L Final  . CO2 06/03/2015 32  22 - 32 mmol/L Final  . Glucose, Bld 06/03/2015 110* 65 - 99 mg/dL Final  . BUN 06/03/2015 24* 6 - 20 mg/dL Final  . Creatinine, Ser 06/03/2015 0.83  0.61 - 1.24 mg/dL Final  . Calcium 06/03/2015 9.0  8.9 - 10.3 mg/dL Final  . GFR calc non Af Amer 06/03/2015 >60  >60 mL/min Final  .  GFR calc Af Amer 06/03/2015 >60  >60 mL/min Final   Comment: (NOTE) The eGFR has been calculated using the CKD EPI equation. This calculation has not been validated in all clinical situations. eGFR's persistently <60 mL/min signify possible Chronic Kidney Disease.   . Anion gap 06/03/2015 9  5 - 15 Final  . WBC 06/04/2015 12.1* 4.0 - 10.5 K/uL Final  . RBC 06/04/2015 4.01* 4.22 -  5.81 MIL/uL Final  . Hemoglobin 06/04/2015 12.5* 13.0 - 17.0 g/dL Final  . HCT 06/04/2015 41.0  39.0 - 52.0 % Final  . MCV 06/04/2015 102.2* 78.0 - 100.0 fL Final  . MCH 06/04/2015 31.2  26.0 - 34.0 pg Final  . MCHC 06/04/2015 30.5  30.0 - 36.0 g/dL Final  . RDW 06/04/2015 15.6* 11.5 - 15.5 % Final  . Platelets 06/04/2015 139* 150 - 400 K/uL Final  . Sodium 06/04/2015 137  135 - 145 mmol/L Final   DELTA CHECK NOTED  . Potassium 06/04/2015 4.5  3.5 - 5.1 mmol/L Final  . Chloride 06/04/2015 99* 101 - 111 mmol/L Final  . CO2 06/04/2015 31  22 - 32 mmol/L Final  . Glucose, Bld 06/04/2015 111* 65 - 99 mg/dL Final  . BUN 06/04/2015 23* 6 - 20 mg/dL Final  . Creatinine, Ser 06/04/2015 0.84  0.61 - 1.24 mg/dL Final  . Calcium 06/04/2015 8.5* 8.9 - 10.3 mg/dL Final  . GFR calc non Af Amer 06/04/2015 >60  >60 mL/min Final  . GFR calc Af Amer 06/04/2015 >60  >60 mL/min Final   Comment: (NOTE) The eGFR has been calculated using the CKD EPI equation. This calculation has not been validated in all clinical situations. eGFR's persistently <60 mL/min signify possible Chronic Kidney Disease.   . Anion gap 06/04/2015 7  5 - 15 Final  . Glucose-Capillary 06/03/2015 177* 65 - 99 mg/dL Final  . Glucose-Capillary 06/03/2015 166* 65 - 99 mg/dL Final  . Glucose-Capillary 06/03/2015 120* 65 - 99 mg/dL Final  . Glucose-Capillary 06/04/2015 115* 65 - 99 mg/dL Final  . Glucose-Capillary 06/04/2015 168* 65 - 99 mg/dL Final  . Glucose-Capillary 06/04/2015 223* 65 - 99 mg/dL Final  . Glucose-Capillary 06/04/2015 211* 65 - 99 mg/dL  Final  . Glucose-Capillary 06/04/2015 141* 65 - 99 mg/dL Final  . Glucose-Capillary 06/05/2015 89  65 - 99 mg/dL Final  . Comment 1 06/05/2015 Notify RN   Final  . Glucose-Capillary 06/03/2015 185* 65 - 99 mg/dL Final  . Glucose-Capillary 06/04/2015 114* 65 - 99 mg/dL Final  . Glucose-Capillary 06/04/2015 182* 65 - 99 mg/dL Final  . Glucose-Capillary 06/05/2015 112* 65 - 99 mg/dL Final  . Glucose-Capillary 06/05/2015 178* 65 - 99 mg/dL Final  . Glucose-Capillary 06/05/2015 179* 65 - 99 mg/dL Final  . Glucose-Capillary 06/05/2015 189* 65 - 99 mg/dL Final  . Comment 1 06/05/2015 Notify RN   Final  . Glucose-Capillary 06/06/2015 103* 65 - 99 mg/dL Final  . Glucose-Capillary 06/06/2015 194* 65 - 99 mg/dL Final  . Glucose-Capillary 06/06/2015 152* 65 - 99 mg/dL Final  . Glucose-Capillary 06/06/2015 201* 65 - 99 mg/dL Final  . Comment 1 06/06/2015 Notify RN   Final  . Comment 2 06/06/2015 Document in Chart   Final  . Glucose-Capillary 06/07/2015 165* 65 - 99 mg/dL Final  . Comment 1 06/07/2015 Notify RN   Final  . Comment 2 06/07/2015 Document in Chart   Final  . Glucose-Capillary 06/07/2015 138* 65 - 99 mg/dL Final  . Glucose-Capillary 06/07/2015 151* 65 - 99 mg/dL Final  . Glucose-Capillary 06/07/2015 148* 65 - 99 mg/dL Final  . Glucose-Capillary 06/07/2015 148* 65 - 99 mg/dL Final  . Comment 1 06/07/2015 Notify RN   Final  . Comment 2 06/07/2015 Document in Chart   Final  . Glucose-Capillary 06/08/2015 120* 65 - 99 mg/dL Final  . Glucose-Capillary 06/08/2015 212* 65 - 99 mg/dL Final  . Glucose-Capillary 06/08/2015 204* 65 - 99  mg/dL Final  . Glucose-Capillary 06/08/2015 167* 65 - 99 mg/dL Final  . Glucose-Capillary 06/09/2015 128* 65 - 99 mg/dL Final  . Glucose-Capillary 06/09/2015 184* 65 - 99 mg/dL Final  Admission on 05/26/2015, Discharged on 05/29/2015  Component Date Value Ref Range Status  . Glucose-Capillary 05/26/2015 183* 65 - 99 mg/dL Final  . Color, Urine 05/26/2015 YELLOW   YELLOW Final  . APPearance 05/26/2015 CLEAR  CLEAR Final  . Specific Gravity, Urine 05/26/2015 1.011  1.005 - 1.030 Final  . pH 05/26/2015 6.0  5.0 - 8.0 Final  . Glucose, UA 05/26/2015 NEGATIVE  NEGATIVE mg/dL Final  . Hgb urine dipstick 05/26/2015 SMALL* NEGATIVE Final  . Bilirubin Urine 05/26/2015 NEGATIVE  NEGATIVE Final  . Ketones, ur 05/26/2015 NEGATIVE  NEGATIVE mg/dL Final  . Protein, ur 05/26/2015 >300* NEGATIVE mg/dL Final  . Urobilinogen, UA 05/26/2015 0.2  0.0 - 1.0 mg/dL Final  . Nitrite 05/26/2015 NEGATIVE  NEGATIVE Final  . Leukocytes, UA 05/26/2015 NEGATIVE  NEGATIVE Final  . WBC 05/26/2015 12.4* 4.0 - 10.5 K/uL Final  . RBC 05/26/2015 4.26  4.22 - 5.81 MIL/uL Final  . Hemoglobin 05/26/2015 13.5  13.0 - 17.0 g/dL Final  . HCT 05/26/2015 41.2  39.0 - 52.0 % Final  . MCV 05/26/2015 96.7  78.0 - 100.0 fL Final  . MCH 05/26/2015 31.7  26.0 - 34.0 pg Final  . MCHC 05/26/2015 32.8  30.0 - 36.0 g/dL Final  . RDW 05/26/2015 15.3  11.5 - 15.5 % Final  . Platelets 05/26/2015 148* 150 - 400 K/uL Final  . Sodium 05/26/2015 134* 135 - 145 mmol/L Final  . Potassium 05/26/2015 2.7* 3.5 - 5.1 mmol/L Final   Comment: CRITICAL RESULT CALLED TO, READ BACK BY AND VERIFIED WITH: MCKEOWN,A RN _0  BY GRINSTEAD,C 8.15.16   . Chloride 05/26/2015 89* 101 - 111 mmol/L Final  . CO2 05/26/2015 30  22 - 32 mmol/L Final  . Glucose, Bld 05/26/2015 176* 65 - 99 mg/dL Final  . BUN 05/26/2015 56* 6 - 20 mg/dL Final  . Creatinine, Ser 05/26/2015 1.26* 0.61 - 1.24 mg/dL Final  . Calcium 05/26/2015 8.3* 8.9 - 10.3 mg/dL Final  . GFR calc non Af Amer 05/26/2015 56* >60 mL/min Final  . GFR calc Af Amer 05/26/2015 >60  >60 mL/min Final   Comment: (NOTE) The eGFR has been calculated using the CKD EPI equation. This calculation has not been validated in all clinical situations. eGFR's persistently <60 mL/min signify possible Chronic Kidney Disease.   . Anion gap 05/26/2015 15  5 - 15 Final  .  Magnesium 05/26/2015 0.9* 1.7 - 2.4 mg/dL Final   Comment: CRITICAL RESULT CALLED TO, READ BACK BY AND VERIFIED WITH: PHILLIPS,T RN _1  BY GRINSTEAD,C 8.15.16   . Troponin i, poc 05/26/2015 0.03  0.00 - 0.08 ng/mL Final  . Comment 3 05/26/2015          Final   Comment: Due to the release kinetics of cTnI, a negative result within the first hours of the onset of symptoms does not rule out myocardial infarction with certainty. If myocardial infarction is still suspected, repeat the test at appropriate intervals.   . Squamous Epithelial / LPF 05/26/2015 RARE  RARE Final  . WBC, UA 05/26/2015 0-2  <3 WBC/hpf Final  . RBC / HPF 05/26/2015 0-2  <3 RBC/hpf Final  . Bacteria, UA 05/26/2015 RARE  RARE Final  . Casts 05/26/2015 HYALINE CASTS* NEGATIVE Final  . Sodium 05/27/2015 137  135 -  145 mmol/L Final  . Potassium 05/27/2015 2.8* 3.5 - 5.1 mmol/L Final  . Chloride 05/27/2015 95* 101 - 111 mmol/L Final  . CO2 05/27/2015 30  22 - 32 mmol/L Final  . Glucose, Bld 05/27/2015 181* 65 - 99 mg/dL Final  . BUN 05/27/2015 52* 6 - 20 mg/dL Final  . Creatinine, Ser 05/27/2015 1.07  0.61 - 1.24 mg/dL Final  . Calcium 05/27/2015 8.1* 8.9 - 10.3 mg/dL Final  . GFR calc non Af Amer 05/27/2015 >60  >60 mL/min Final  . GFR calc Af Amer 05/27/2015 >60  >60 mL/min Final   Comment: (NOTE) The eGFR has been calculated using the CKD EPI equation. This calculation has not been validated in all clinical situations. eGFR's persistently <60 mL/min signify possible Chronic Kidney Disease.   . Anion gap 05/27/2015 12  5 - 15 Final  . Glucose-Capillary 05/27/2015 186* 65 - 99 mg/dL Final  . Glucose-Capillary 05/26/2015 174* 65 - 99 mg/dL Final  . Glucose-Capillary 05/27/2015 153* 65 - 99 mg/dL Final  . Sodium 05/27/2015 137  135 - 145 mmol/L Final  . Potassium 05/27/2015 3.4* 3.5 - 5.1 mmol/L Final  . Chloride 05/27/2015 97* 101 - 111 mmol/L Final  . CO2 05/27/2015 32  22 - 32 mmol/L Final  . Glucose, Bld  05/27/2015 155* 65 - 99 mg/dL Final  . BUN 05/27/2015 47* 6 - 20 mg/dL Final  . Creatinine, Ser 05/27/2015 0.99  0.61 - 1.24 mg/dL Final  . Calcium 05/27/2015 8.3* 8.9 - 10.3 mg/dL Final  . GFR calc non Af Amer 05/27/2015 >60  >60 mL/min Final  . GFR calc Af Amer 05/27/2015 >60  >60 mL/min Final   Comment: (NOTE) The eGFR has been calculated using the CKD EPI equation. This calculation has not been validated in all clinical situations. eGFR's persistently <60 mL/min signify possible Chronic Kidney Disease.   . Anion gap 05/27/2015 8  5 - 15 Final  . Magnesium 05/27/2015 1.3* 1.7 - 2.4 mg/dL Final  . Glucose-Capillary 05/27/2015 157* 65 - 99 mg/dL Final  . Comment 1 05/27/2015 Notify RN   Final  . Glucose-Capillary 05/27/2015 151* 65 - 99 mg/dL Final  . Comment 1 05/27/2015 Notify RN   Final  . Sodium 05/28/2015 136  135 - 145 mmol/L Final  . Potassium 05/28/2015 3.7  3.5 - 5.1 mmol/L Final  . Chloride 05/28/2015 96* 101 - 111 mmol/L Final  . CO2 05/28/2015 31  22 - 32 mmol/L Final  . Glucose, Bld 05/28/2015 132* 65 - 99 mg/dL Final  . BUN 05/28/2015 44* 6 - 20 mg/dL Final  . Creatinine, Ser 05/28/2015 0.90  0.61 - 1.24 mg/dL Final  . Calcium 05/28/2015 8.9  8.9 - 10.3 mg/dL Final  . GFR calc non Af Amer 05/28/2015 >60  >60 mL/min Final  . GFR calc Af Amer 05/28/2015 >60  >60 mL/min Final   Comment: (NOTE) The eGFR has been calculated using the CKD EPI equation. This calculation has not been validated in all clinical situations. eGFR's persistently <60 mL/min signify possible Chronic Kidney Disease.   . Anion gap 05/28/2015 9  5 - 15 Final  . Magnesium 05/28/2015 1.4* 1.7 - 2.4 mg/dL Final  . Glucose-Capillary 05/27/2015 136* 65 - 99 mg/dL Final  . Glucose-Capillary 05/28/2015 117* 65 - 99 mg/dL Final  . Glucose-Capillary 05/28/2015 158* 65 - 99 mg/dL Final  . Comment 1 05/28/2015 Notify RN   Final  . Comment 2 05/28/2015 Document in Chart  Final  . Color, Synovial 05/28/2015  YELLOW  YELLOW Final  . Appearance-Synovial 05/28/2015 TURBID* CLEAR Final  . Crystals, Fluid 05/28/2015 INTRACELLULAR MONOSODIUM URATE CRYSTALS   Final   EXTRACELLULAR MONOSODIUM URATE CRYSTALS  . WBC, Synovial 05/28/2015 11500* 0 - 200 /cu mm Final  . Neutrophil, Synovial 05/28/2015 90* 0 - 25 % Final  . Lymphocytes-Synovial Fld 05/28/2015 4  0 - 20 % Final  . Monocyte-Macrophage-Synovial Fluid 05/28/2015 6* 50 - 90 % Final  . Eosinophils-Synovial 05/28/2015 0  0 - 1 % Final  . Other Cells-SYN 05/28/2015 0   Final  . Specimen Description 05/28/2015 SYNOVIAL LEFT KNEE   Final  . Special Requests 05/28/2015 NONE   Final  . Culture 05/28/2015 NO GROWTH 5 DAYS   Final  . Report Status 05/28/2015 06/02/2015 FINAL   Final  . Specimen Description 05/28/2015 SYNOVIAL LEFT KNEE   Final  . Special Requests 05/28/2015 NONE   Final  . Gram Stain 05/28/2015    Final                   Value:ABUNDANT WBC PRESENT,BOTH PMN AND MONONUCLEAR NO ORGANISMS SEEN   . Report Status 05/28/2015 05/28/2015 FINAL   Final  . Glucose-Capillary 05/28/2015 150* 65 - 99 mg/dL Final  . Comment 1 05/28/2015 Notify RN   Final  . Comment 2 05/28/2015 Document in Chart   Final  . Sodium 05/29/2015 138  135 - 145 mmol/L Final  . Potassium 05/29/2015 4.3  3.5 - 5.1 mmol/L Final  . Chloride 05/29/2015 96* 101 - 111 mmol/L Final  . CO2 05/29/2015 33* 22 - 32 mmol/L Final  . Glucose, Bld 05/29/2015 270* 65 - 99 mg/dL Final  . BUN 05/29/2015 41* 6 - 20 mg/dL Final  . Creatinine, Ser 05/29/2015 0.87  0.61 - 1.24 mg/dL Final  . Calcium 05/29/2015 9.3  8.9 - 10.3 mg/dL Final  . GFR calc non Af Amer 05/29/2015 >60  >60 mL/min Final  . GFR calc Af Amer 05/29/2015 >60  >60 mL/min Final   Comment: (NOTE) The eGFR has been calculated using the CKD EPI equation. This calculation has not been validated in all clinical situations. eGFR's persistently <60 mL/min signify possible Chronic Kidney Disease.   . Anion gap 05/29/2015 9   5 - 15 Final  . Magnesium 05/29/2015 1.6* 1.7 - 2.4 mg/dL Final  . Glucose-Capillary 05/28/2015 166* 65 - 99 mg/dL Final  . Glucose-Capillary 05/29/2015 276* 65 - 99 mg/dL Final  . Glucose-Capillary 05/29/2015 189* 65 - 99 mg/dL Final  . Comment 1 05/29/2015 Notify RN   Final    Ct Abdomen Pelvis Wo Contrast  08/17/2015  CLINICAL DATA:  Right upper quadrant abdominal pain and right upper quadrant tendinous on physical examination today. EXAM: CT ABDOMEN AND PELVIS WITHOUT CONTRAST TECHNIQUE: Multidetector CT imaging of the abdomen and pelvis was performed following the standard protocol without IV contrast. COMPARISON:  07/01/2015 FINDINGS: Lower chest: Small bilateral pleural effusions with overlying atelectasis. The heart is enlarged. Advanced three-vessel coronary artery calcifications and moderate aortic calcifications. The distal esophagus is grossly normal. Hepatobiliary: No focal hepatic lesions or intrahepatic biliary dilatation. The gallbladder is normal. No common bile duct dilatation. Pancreas: Mild diffuse atrophy. No mass, inflammation or ductal dilatation. Spleen: Normal size.  No focal lesions. Adrenals/Urinary Tract: The adrenal glands are normal. Stable bilateral renal cysts. No worrisome renal lesion, renal calculi or obstructing ureteral calculi. No bladder calculi. Mild uniform bladder wall thickening. Stomach/Bowel: The stomach,  duodenum, small bowel and colon are grossly normal without oral contrast. No obvious inflammatory changes, mass lesions or obstructive findings. There is diverticulosis involving the descending and sigmoid colon without definite findings for acute diverticulitis. Moderate stool throughout the colon and down into the rectum may suggest constipation. The terminal ileum is normal. The appendix is normal. Vascular/Lymphatic: No mesenteric or retroperitoneal mass or adenopathy. Stable small scattered lymph nodes. Stable aortic and branch vessel ostial calcifications.  No focal aneurysm. Other: Mild uniform bladder wall thickening. The prostate gland and seminal vesicles are unremarkable. No pelvic mass or adenopathy. No free pelvic fluid collections. Small scattered lymph nodes are stable. No inguinal mass or adenopathy. Stable surgical changes involving the anterior abdominal wall. Musculoskeletal: No significant bony findings. Severe degenerative changes involving the spine appears stable. There may be progress of advanced degenerative changes at L4-5 but I do not see any obvious paraspinal process to suggest infection. IMPRESSION: 1. Small bilateral pleural effusions and overlying atelectasis. 2. Advanced three-vessel coronary artery calcifications and moderate atherosclerotic calcification involving the thoracic and abdominal aorta and branch vessels. 3. Numerous bilateral renal cysts appear stable. 4. No acute abdominal/pelvic findings, mass lesions or adenopathy. 5. Mild uniform bladder wall thickening without discrete mass. 6. Severe and progressive degenerative changes in the lower lumbar spine, particularly at L4-5. Electronically Signed   By: Marijo Sanes M.D.   On: 08/17/2015 16:02   Dg Chest 2 View  08/11/2015  CLINICAL DATA:  Shortness of breath. Altered mental status. Choked while eating this morning. Possible aspiration. EXAM: CHEST  2 VIEW COMPARISON:  07/04/2015. FINDINGS: Stable enlarged cardiac silhouette and post CABG changes. Interval mild patchy and linear density at the right lung base. Stable prominence of the interstitial markings. Mild increase in prominence of the pulmonary vasculature. Breathing motion blurring on the lateral view. IMPRESSION: 1. Atelectasis at the right lung base. Superimposed aspiration is unlikely based on the current appearance. 2. Stable cardiomegaly and mild chronic interstitial lung disease with interval mild pulmonary vascular congestion. Electronically Signed   By: Claudie Revering M.D.   On: 08/11/2015 17:07   Ct Head Wo  Contrast  08/11/2015  CLINICAL DATA:  Initial encounter for 69 YOM walking using his walker and fell backwards and hit his head today 08/11/15. EXAM: CT HEAD WITHOUT CONTRAST CT CERVICAL SPINE WITHOUT CONTRAST TECHNIQUE: Multidetector CT imaging of the head and cervical spine was performed following the standard protocol without intravenous contrast. Multiplanar CT image reconstructions of the cervical spine were also generated. COMPARISON:  Head CT of 05/26/2015. No prior cervical spine imaging. FINDINGS: CT HEAD FINDINGS Sinuses/Soft tissues: Mild left parietal scalp soft tissue thickening on image/series 67/4. Right maxillary sinus mucous retention cyst or polyp. No skull fracture. Clear mastoid air cells. Intracranial: Cerebral atrophy. Moderate low density in the periventricular white matter likely related to small vessel disease. Left cerebellar remote infarct. No mass lesion, hemorrhage, hydrocephalus, acute infarct, intra-axial, or extra-axial fluid collection. CT CERVICAL SPINE FINDINGS Spinal visualization through the bottom of T4. Prevertebral soft tissues are within normal limits. Dense carotid atherosclerosis bilaterally. No apical pneumothorax. Advanced spondylosis. Left worse than right neural foraminal narrowing at multiple levels. Mild central canal stenosis, most significant at C3-4. Prior median sternotomy. Maintenance of vertebral body height. Trace C3-4 retrolisthesis. From C7 inferiorly are mildly degraded secondary to patient body habitus. Facets are well-aligned. Coronal reformats demonstrate a normal C1-C2 articulation. IMPRESSION: 1. Mild left parietal scalp soft tissue swelling. No acute intracranial abnormality. 2. Advanced cervical spondylosis.  Trace C3-4 is favored to be degenerative. Otherwise, no acute findings in the cervical spine identified. 3.  Cerebral atrophy and small vessel ischemic change. Electronically Signed   By: Abigail Miyamoto M.D.   On: 08/11/2015 16:50   Ct Angio  Chest Pe W/cm &/or Wo Cm  08/11/2015  CLINICAL DATA:  Worsening dyspnea.  Altered mental status. EXAM: CT ANGIOGRAPHY CHEST WITH CONTRAST TECHNIQUE: Multidetector CT imaging of the chest was performed using the standard protocol during bolus administration of intravenous contrast. Multiplanar CT image reconstructions and MIPs were obtained to evaluate the vascular anatomy. CONTRAST:  25m OMNIPAQUE IOHEXOL 350 MG/ML SOLN COMPARISON:  08/28/2014 FINDINGS: Cardiovascular: There is good opacification of the pulmonary arteries. There is no pulmonary embolism. The thoracic aorta is normal in caliber and intact. There is extensive calcified coronary artery plaque. There are multiple coronary artery bypass grafts. Lungs: There are multifocal confluent airspace opacities, central predominant. Considerations include alveolar edema, infectious infiltrate. Hemorrhage or neoplasm less likely but not entirely excluded. Central airways: Patent Effusions: None Lymphadenopathy: Mildly prominent hilar and mediastinal nodes, more likely reactive Esophagus: Unremarkable Upper abdomen: Unremarkable Musculoskeletal: No significant abnormality except for moderate thoracic degenerative disc disease, kyphosis, and prior sternotomy. Review of the MIP images confirms the above findings. IMPRESSION: 1. Negative for acute pulmonary embolism 2. Confluent airspace opacities bilaterally, right worse than left. Infectious infiltrate is a leading consideration. Alveolar edema less likely but not excluded. There also are mildly prominent hilar and mediastinal nodes which may be reactive. Electronically Signed   By: DAndreas NewportM.D.   On: 08/11/2015 23:21   Ct Cervical Spine Wo Contrast  08/11/2015  CLINICAL DATA:  Initial encounter for 69 YOM walking using his walker and fell backwards and hit his head today 08/11/15. EXAM: CT HEAD WITHOUT CONTRAST CT CERVICAL SPINE WITHOUT CONTRAST TECHNIQUE: Multidetector CT imaging of the head and  cervical spine was performed following the standard protocol without intravenous contrast. Multiplanar CT image reconstructions of the cervical spine were also generated. COMPARISON:  Head CT of 05/26/2015. No prior cervical spine imaging. FINDINGS: CT HEAD FINDINGS Sinuses/Soft tissues: Mild left parietal scalp soft tissue thickening on image/series 67/4. Right maxillary sinus mucous retention cyst or polyp. No skull fracture. Clear mastoid air cells. Intracranial: Cerebral atrophy. Moderate low density in the periventricular white matter likely related to small vessel disease. Left cerebellar remote infarct. No mass lesion, hemorrhage, hydrocephalus, acute infarct, intra-axial, or extra-axial fluid collection. CT CERVICAL SPINE FINDINGS Spinal visualization through the bottom of T4. Prevertebral soft tissues are within normal limits. Dense carotid atherosclerosis bilaterally. No apical pneumothorax. Advanced spondylosis. Left worse than right neural foraminal narrowing at multiple levels. Mild central canal stenosis, most significant at C3-4. Prior median sternotomy. Maintenance of vertebral body height. Trace C3-4 retrolisthesis. From C7 inferiorly are mildly degraded secondary to patient body habitus. Facets are well-aligned. Coronal reformats demonstrate a normal C1-C2 articulation. IMPRESSION: 1. Mild left parietal scalp soft tissue swelling. No acute intracranial abnormality. 2. Advanced cervical spondylosis. Trace C3-4 is favored to be degenerative. Otherwise, no acute findings in the cervical spine identified. 3.  Cerebral atrophy and small vessel ischemic change. Electronically Signed   By: KAbigail MiyamotoM.D.   On: 08/11/2015 16:50   Mr Brain Wo Contrast  08/12/2015  CLINICAL DATA:  Initial evaluation for syncope. EXAM: MRI HEAD WITHOUT CONTRAST TECHNIQUE: Multiplanar, multiecho pulse sequences of the brain and surrounding structures were obtained without intravenous contrast. COMPARISON:  Prior CT from  on 08/11/2015. FINDINGS: Diffuse  prominence of the CSF containing spaces is compatible with generalized age-related cerebral atrophy. Patchy T2/FLAIR hyperintensity within the periventricular and deep white matter both cerebral hemispheres most likely related chronic small vessel ischemic disease. Encephalomalacia within the left cerebellar hemisphere consistent with remote infarct. Associated chronic hemosiderin staining present within this region. Probable remote infarct within the anteromedial left frontal lobe as well. There is a possible tiny punctate 4 mm focus of high signal intensity on DWI sequence involving the cortical gray matter of the posterior right frontal region (series 3, image 31 on axial sequence, series 5, image 14 on coronal sequence). Finding may reflect a tiny acute ischemic infarct. Corresponding signal loss not definitely seen on ADC map, although this is felt to be too small to be resolved. No other definite acute intracranial infarct. Abnormal flow void within the distal left vertebral artery, which may related to focal plaque or possibly chronic dissection (series 8, image 3), similar to prior. Major intracranial vascular flow voids otherwise maintained. The right vertebral artery is diminutive. No acute intracranial hemorrhage. No mass lesion, midline shift, or mass effect. No hydrocephalus. No extra-axial fluid collection. Craniocervical junction grossly within normal limits. Degenerative spondylolysis noted within the partially visualized upper cervical spine. Pituitary gland grossly normal. No acute abnormality about the orbits. Sequela prior lens extraction present on the left. Mild mucosal thickening within the maxillary sinuses and ethmoidal air cells. Paranasal sinuses are otherwise clear. Minimal opacity within the right mastoid air cells. Inner ear structures normal. Bone marrow signal intensity within normal limits. No scalp soft tissue abnormality. IMPRESSION: 1. Question 4  mm punctate focus of high signal intensity on DWI sequence within the posterior right frontal lobe. Finding favored to reflect artifact, although possible tiny acute ischemic infarct is not entirely excluded, and could be considered in the correct clinical setting. 2. No other acute intracranial process. 3. 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 4. Abnormal flow void within the distal left vertebral artery, which may be related to focal plaque or possibly chronic dissection. This is similar relative to prior study. 5. Age-related cerebral atrophy with mild to moderate chronic small vessel ischemic disease. Electronically Signed   By: Jeannine Boga M.D.   On: 08/12/2015 02:40   US Renal  08/14/2015  CLINICAL DATA:  69 year old male with acute renal failure EXAM: RENAL / URINARY TRACT ULTRASOUND COMPLETE COMPARISON:  CT scan of the abdomen and pelvis 07/01/2015 FINDINGS: Right Kidney: Length: 12.2 cm. Technically challenging examination secondary to patient body habitus and diffuse edema. The right kidney is very poorly seen. No obvious hydronephrosis. Left Kidney: Length: 12.3 cm. Poorly seen. No hydronephrosis. 4.3 cm anechoic simple cyst in the interpolar region. Bladder: Not visualized. IMPRESSION: 1. Technically limited study secondary to patient body habitus and diffuse soft tissue edema. 2. The right kidney is very poorly seen.  No obvious hydronephrosis. 3. No left-sided hydronephrosis. 4. Simple renal cysts on the left. Electronically Signed   By: Jacqulynn Cadet M.D.   On: 08/14/2015 21:25   Dg Chest Port 1 View  08/15/2015  CLINICAL DATA:  Short of breath, COPD, asthma EXAM: PORTABLE CHEST 1 VIEW COMPARISON:  Radiograph 08/12/2015 FINDINGS: Sternotomy wires overlie normal cardiac silhouette. Linear opacity at the RIGHT lung base again demonstrated slightly improved. Patchy perihilar airspace disease again demonstrated. No pneumothorax.  IMPRESSION: 1. Slight improvement in density of RIGHT lower lobe atelectasis versus infiltrate. 2. No change in perihilar opacities representing mild edema or infection. Electronically Signed  By: Suzy Bouchard M.D.   On: 08/15/2015 08:02   Dg Abd Acute W/chest  08/12/2015  CLINICAL DATA:  Episode of nausea and vomiting earlier, no abdominal pain, history of bowel resection and breast malignancy. EXAM: DG ABDOMEN ACUTE W/ 1V CHEST COMPARISON:  Portable chest x-ray of August 11, 2015 and chest CT scan of the same day FINDINGS: The lungs are borderline hypoinflated. Confluent alveolar opacities are present at both lung bases greater on the right than on the left. A trace of pleural fluid on the right is suspected. The cardiac silhouette is mildly enlarged. The central pulmonary vascularity is engorged and indistinct. The patient has undergone previous CABG. The stool burden within the colon is mildly increased. There is a moderate amount of gas within loops of the sigmoid colon. There is no small or large bowel obstructive pattern and no evidence of perforation. There is degenerative change of the lower lumbar spine. There is contrast within the urinary bladder from the earlier CT scan. There is splenic arterial calcification. IMPRESSION: 1. Patchy airspace opacities bilaterally consistent with pneumonia. A small amount of pleural fluid is likely present on the right. 2. CHF with mild pulmonary vascular congestion. 3. No acute intra-abdominal abnormality is observed. Increased colonic stool burden may reflect constipation in the appropriate clinical setting. Electronically Signed   By: David  Martinique M.D.   On: 08/12/2015 08:16     Assessment/Plan   ICD-9-CM ICD-10-CM   1. DIASTOLIC HEART FAILURE, CHRONIC 428.32 I50.32   2. Pulmonary hypertension (HCC) 416.8 I27.2   3. PAF (paroxysmal atrial fibrillation) (HCC) 427.31 I48.0   4. Pulmonary emphysema, unspecified emphysema type (HCC) 492.8 J43.9   5.  Diabetic polyneuropathy associated with type 2 diabetes mellitus (HCC) 250.60 E11.42    357.2    6. Hypothyroidism due to acquired atrophy of thyroid 244.8 E03.8    246.8 E03.4   7. History of stroke V12.54 Z86.73   8. Atherosclerosis of native coronary artery of native heart with angina pectoris (HCC) 414.01 I25.119    413.9    9. Obstructive sleep apnea 327.23 G47.33   10.     Hx male breast CA s/p right mastectomy - currently on tamoxifen  Check weight daily and record. Adjust diuretics accordingly  PT/OT/ST as ordered  Cont current meds as ordered  Nutritional supplements as ordered  F/u with specialists as scheduled  GOAL: short term rehab and d/c home when medically appropriate. Communicated with pt and nursing.  Will follow  Kaaren Nass S. Perlie Gold  [facility] [address] [place], [place] [address] [phone] Cell (Monday-Friday 8 AM - 5 PM) [phone] After 5 PM and follow prompts

## 2015-09-03 ENCOUNTER — Ambulatory Visit (HOSPITAL_COMMUNITY)
Admission: RE | Admit: 2015-09-03 | Discharge: 2015-09-03 | Disposition: A | Discharge status: Home / Self Care | Payer: No Typology Code available for payment source | Source: Ambulatory Visit | Attending: Internal Medicine | Admitting: Internal Medicine

## 2015-09-03 VITALS — BP 112/64 | HR 86 | Wt 233.8 lb

## 2015-09-03 DIAGNOSIS — Z9981 Dependence on supplemental oxygen: Secondary | ICD-10-CM | POA: Insufficient documentation

## 2015-09-03 DIAGNOSIS — Z823 Family history of stroke: Secondary | ICD-10-CM | POA: Insufficient documentation

## 2015-09-03 DIAGNOSIS — Z853 Personal history of malignant neoplasm of breast: Secondary | ICD-10-CM | POA: Diagnosis not present

## 2015-09-03 DIAGNOSIS — Z7982 Long term (current) use of aspirin: Secondary | ICD-10-CM | POA: Insufficient documentation

## 2015-09-03 DIAGNOSIS — K432 Incisional hernia without obstruction or gangrene: Secondary | ICD-10-CM | POA: Diagnosis not present

## 2015-09-03 DIAGNOSIS — Z8673 Personal history of transient ischemic attack (TIA), and cerebral infarction without residual deficits: Secondary | ICD-10-CM | POA: Diagnosis not present

## 2015-09-03 DIAGNOSIS — E039 Hypothyroidism, unspecified: Secondary | ICD-10-CM | POA: Diagnosis not present

## 2015-09-03 DIAGNOSIS — N183 Chronic kidney disease, stage 3 (moderate): Secondary | ICD-10-CM | POA: Diagnosis not present

## 2015-09-03 DIAGNOSIS — I5033 Acute on chronic diastolic (congestive) heart failure: Secondary | ICD-10-CM

## 2015-09-03 DIAGNOSIS — J449 Chronic obstructive pulmonary disease, unspecified: Secondary | ICD-10-CM | POA: Diagnosis not present

## 2015-09-03 DIAGNOSIS — F1010 Alcohol abuse, uncomplicated: Secondary | ICD-10-CM | POA: Insufficient documentation

## 2015-09-03 DIAGNOSIS — I129 Hypertensive chronic kidney disease with stage 1 through stage 4 chronic kidney disease, or unspecified chronic kidney disease: Secondary | ICD-10-CM | POA: Insufficient documentation

## 2015-09-03 DIAGNOSIS — I5032 Chronic diastolic (congestive) heart failure: Secondary | ICD-10-CM

## 2015-09-03 DIAGNOSIS — I6523 Occlusion and stenosis of bilateral carotid arteries: Secondary | ICD-10-CM | POA: Insufficient documentation

## 2015-09-03 DIAGNOSIS — E876 Hypokalemia: Secondary | ICD-10-CM | POA: Diagnosis not present

## 2015-09-03 DIAGNOSIS — J9600 Acute respiratory failure, unspecified whether with hypoxia or hypercapnia: Secondary | ICD-10-CM | POA: Diagnosis not present

## 2015-09-03 DIAGNOSIS — Z951 Presence of aortocoronary bypass graft: Secondary | ICD-10-CM | POA: Insufficient documentation

## 2015-09-03 DIAGNOSIS — Z794 Long term (current) use of insulin: Secondary | ICD-10-CM | POA: Insufficient documentation

## 2015-09-03 DIAGNOSIS — E785 Hyperlipidemia, unspecified: Secondary | ICD-10-CM | POA: Insufficient documentation

## 2015-09-03 DIAGNOSIS — I350 Nonrheumatic aortic (valve) stenosis: Secondary | ICD-10-CM | POA: Insufficient documentation

## 2015-09-03 DIAGNOSIS — H01009 Unspecified blepharitis unspecified eye, unspecified eyelid: Secondary | ICD-10-CM | POA: Diagnosis not present

## 2015-09-03 DIAGNOSIS — M109 Gout, unspecified: Secondary | ICD-10-CM | POA: Diagnosis not present

## 2015-09-03 DIAGNOSIS — Z881 Allergy status to other antibiotic agents status: Secondary | ICD-10-CM | POA: Insufficient documentation

## 2015-09-03 DIAGNOSIS — F329 Major depressive disorder, single episode, unspecified: Secondary | ICD-10-CM | POA: Insufficient documentation

## 2015-09-03 DIAGNOSIS — Z79899 Other long term (current) drug therapy: Secondary | ICD-10-CM | POA: Insufficient documentation

## 2015-09-03 DIAGNOSIS — I481 Persistent atrial fibrillation: Secondary | ICD-10-CM | POA: Insufficient documentation

## 2015-09-03 DIAGNOSIS — Z9119 Patient's noncompliance with other medical treatment and regimen: Secondary | ICD-10-CM | POA: Insufficient documentation

## 2015-09-03 DIAGNOSIS — I2510 Atherosclerotic heart disease of native coronary artery without angina pectoris: Secondary | ICD-10-CM | POA: Insufficient documentation

## 2015-09-03 DIAGNOSIS — Z87891 Personal history of nicotine dependence: Secondary | ICD-10-CM | POA: Insufficient documentation

## 2015-09-03 DIAGNOSIS — I482 Chronic atrial fibrillation: Secondary | ICD-10-CM | POA: Insufficient documentation

## 2015-09-03 DIAGNOSIS — K219 Gastro-esophageal reflux disease without esophagitis: Secondary | ICD-10-CM | POA: Diagnosis not present

## 2015-09-03 LAB — BASIC METABOLIC PANEL
Anion gap: 8 (ref 5–15)
BUN: 23 mg/dL — AB (ref 6–20)
CALCIUM: 9.1 mg/dL (ref 8.9–10.3)
CHLORIDE: 105 mmol/L (ref 101–111)
CO2: 24 mmol/L (ref 22–32)
CREATININE: 1.03 mg/dL (ref 0.61–1.24)
GFR calc Af Amer: >60 mL/min (ref >60)
GFR calc non Af Amer: >60 mL/min (ref >60)
GLUCOSE: 133 mg/dL — AB (ref 65–99)
Potassium: 4.7 mmol/L (ref 3.5–5.1)
Sodium: 137 mmol/L (ref 135–145)

## 2015-09-03 LAB — BRAIN NATRIURETIC PEPTIDE: B Natriuretic Peptide: 395.2 pg/mL — ABNORMAL HIGH (ref 0.0–100.0)

## 2015-09-03 NOTE — Progress Notes (Signed)
Advanced Heart Failure Clinic Note   Date:  09/03/2015   ID:  [name], DOB [birth year], MRN [id number]  PCP:  [name] Every, MD  Cardiologist:  Dr. Lauree [name]   CHF:  Dr. Haroldine [name]   HPI: [name] is a 69 y.o. 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 Patient is not a Coumadin candidate secondary to history of alcohol abuse. He was admitted 04/2014 with acute on chronic diastolic CHF and developed cardiorenal syndrome requiring temporary hemodialysis.  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  Myoview 05/2014 was neg for ischemia.  He has a hx of breast CA and underwent mastectomy.   He has had 5 admission in the past 6 months as of 09/03/15.  In mid August, he was admitted with generalized weakness in the setting of metabolic derangement with severe hypokalemia and hypomagnesemia related to diuretic use as well as alcohol intake.   Admitted 8/22-8/29 for exploratory laparotomy with small bowel resection and incisional hernia repair with Dr. Ninfa [name]. He was discharged to inpatient rehabilitation. He developed volume excess while in inpatient rehabilitation requiring IV Lasix. Echocardiogram demonstrated normal LV function, mild aortic stenosis and moderate biatrial enlargement.   Admitted 9/20-9/26 with sepsis in the setting of lower extremity cellulitis. With fluid resuscitation, he developed acute on chronic diastolic CHF with pulmonary edema.   Admitted 08/11/15 with acute respiratory failure. He was treated for PNA with Zosyn.  Also had RUQ, CT 08/17/15 with no findings. Also noted to be volume overloaded. He initially had slow diuresis and HF team was consulted.  Had improved diuresis on IV lasix up to 80 mg IV BID with acetazolamide 500 mg BID.  Discharge weight was 224 lb, and we  increased lasix to 80 mg BID on d/c.   He returns for today for post hospital follow up. Daughter present. Now at [facility] and [place]. We haven't seen him in the office for some time. He had been following with [facility]. He is up 9 lbs from discharge by our scales. Lasix has been increased to 120mg  am 80 mg pm since discharge. Primary complaint today is red, itchy eyes. Sees opthalmology Monday. Not able to be very active. Breathing is stable. Denies chest pain. Hopes to go home in few weeks.  Studies: - LHC (11/01): 3 vessel CAD >>> CABG - Nuclear (9/14): Inferolateral, no ischemia, not gated, probable low risk scan >>> med rx - Carotid US (5/15): Bilateral ICA 60-79%, >50% RECA- f/u 69-months - Echo (7/15): EF 50-55%, MAC, moderate to severe LAE, moderate RVH, moderate RAE - Nuclear (8/15):  Normal, EF 63% - Carotid US (6/16):  Bilateral ICA 40-59%; > 50% bilat ECA, L VA occluded, FU 1 year - Echo (06/12/15):  EF 60-65%, no RWMA, mild AS (mean 12 mmHg), MAC, mod LAE, mod RAE - Venous Duplex (07/03/15):  Neg for DVT bilaterally  - Echo 08/16/15 : EF 55-60%, Mild AS, RV moderately dilated, Mild/moderate reduction in RV function. Severe RAE. PA peak pressure 82  Allergies:   Erythromycin   Social History:  The patient  reports that he quit smoking about 46 years ago. He has never used smokeless tobacco. He reports that he drinks alcohol. He reports that he uses illicit drugs (Marijuana).   Family History:  The patient's family history includes CVA in his  father and mother; Cancer in his mother; Stomach cancer (age of onset: 78) in his brother. There is no history of Heart attack.    Past Medical History  Diagnosis Date  . CHRONIC OBSTRUCTIVE PULMONARY DISEASE 06/20/2009  . CAROTID STENOSIS 06/20/2009    A. 08/2001 s/p L CEA;  B.   09/14/11 - Carotid U/S - 40-59% bilateral stenosis, left CEA patch angioplasty is patent  . DM 06/20/2009  . CAD 06/20/2009    A.  08/2000 - s/p CABG x 4 -  LIMA-LAD, Left Radial-OM, VG-DIAG, VG-RCA;  B. Neg. MV  2010  . HYPERLIPIDEMIA 06/20/2009  . HYPERTENSION 06/20/2009  . Hypothyroidism   . Low back pain   . Asthma     as child  . Pneumonia   . Persistent atrial fibrillation (HCC)     Not felt to be coumadin candidate 2/2 ETOH use.  Marland Kitchen History of tobacco abuse     remote - quit 1970  . Morbidly obese ([place])   . Chronic diastolic heart failure, NYHA class 3 ([place])     Followed by CHF Clinic --> Echo 04/2014: EF 99991111, Diastolic DysFxn - elevated LVEDP & LAP. Mod Dil LA & RA.  . Falls frequently   . Hx of cardiovascular stress test 05/2014    Lexiscan Myoview (8/15):  No ischemia; EF 63% - Normal Study  . Breast cancer, right breast ([place])   . Gout   . OBSTRUCTIVE SLEEP APNEA 06/20/2009    does not use cpap  . Stroke [facility])     found on MRI in Oct. 2015.   Marland Kitchen Shortness of breath dyspnea     occasional  . Depression     takes Prozac  . GERD (gastroesophageal reflux disease)     takes Prilosec  . Arthritis   . Neuromuscular disorder (HCC)     neuropathy in both legs  . Constipation   . ETOH abuse     no alcohol since Oct. 2015  . 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 CHF (congestive heart failure) ([place])   . Supplemental oxygen dependent     hs  . Bilateral renal cysts   . Kidney disease     Stage 3  . Pulmonary HTN ([place])     a. [place] 11/16: severe pulmonary HTN, PASP 57, PA sat 42%, Ao sat 90%, CO 4.2 L/min, CI 1.88, PCWP mean 24, RA mean 14    Current Outpatient Prescriptions  Medication Sig Dispense Refill  . albuterol (PROVENTIL) (2.5 MG/3ML) 0.083% nebulizer solution Take 3 mLs (2.5 mg total) by nebulization [name] 2 (two) hours as needed for wheezing or shortness of breath.    Marland Kitchen aspirin EC 325 MG EC tablet Take 1 tablet (325 mg total) by mouth daily. 30 tablet 0  . atorvastatin (LIPITOR) 20 MG tablet Take 1 tablet (20 mg total) by mouth daily. 30 tablet 1  . carvedilol  (COREG) 3.125 MG tablet Take 2 tablets (6.25 mg total) by mouth 2 (two) times daily with a meal. 60 tablet 1  . colchicine 0.6 MG tablet Take 0.6 mg by mouth daily.    Marland Kitchen FLUoxetine (PROZAC) 20 MG capsule Take 1 capsule (20 mg total) by mouth daily. 30 capsule 1  . fluticasone (FLOVENT HFA) 110 MCG/ACT inhaler Inhale 2 puffs into the lungs 2 (two) times daily.    . folic acid (FOLVITE) 1 MG tablet Take 1 tablet (1 mg total)  by mouth daily. 30 tablet 1  . furosemide (LASIX) 40 MG tablet Take 2 tablets (80 mg total) by mouth 2 (two) times daily. Pt take 120 mg in AM and 80 mg in PM (Patient taking differently: Take 80-120 mg by mouth 2 (two) times daily. Pt take 120 mg in AM and 80 mg in PM)    . gabapentin (NEURONTIN) 300 MG capsule Take 1 capsule (300 mg total) by mouth 2 (two) times daily. 60 capsule 1  . hydrALAZINE (APRESOLINE) 100 MG tablet Take 1 tablet (100 mg total) by mouth [name] 8 (eight) hours.    . insulin aspart (NOVOLOG) 100 UNIT/ML injection 0-9 Units, Subcutaneous, 3 times daily with meals, First dose on Tue 08/12/15 at 0800 Correction coverage: Sensitive (thin, NPO, renal) CBG < 70: implement hypoglycemia protocol CBG 70 - 120: 0 units CBG 121 - 150: 1 unit CBG 151 - 200: 2 units CBG 201 - 250: 3 units CBG 251 - 300: 5 units CBG 301 - 350: 7 units CBG 351 - 400: 9 units CBG > 400: call MD    . ipratropium-albuterol (DUONEB) 0.5-2.5 (3) MG/3ML SOLN Take 3 mLs by nebulization 2 (two) times daily.    . isosorbide dinitrate (ISORDIL) 10 MG tablet Take 1 tablet (10 mg total) by mouth 3 (three) times daily.    Marland Kitchen levothyroxine (SYNTHROID, LEVOTHROID) 125 MCG tablet Take 1.5 tablets (187 mcg total) by mouth daily before breakfast. 30 tablet 3  . magnesium oxide (MAG-OX) 400 (241.3 MG) MG tablet Take 1 tablet (400 mg total) by mouth 2 (two) times daily. 60 tablet 11  . omeprazole (PRILOSEC) 20 MG capsule Take 1 capsule (20 mg total) by mouth daily. 30 capsule 1  . Oxycodone HCl 10 MG  TABS Take 1 tablet (10 mg total) by mouth 3 (three) times daily as needed (pain). 20 tablet 0  . potassium chloride SA (KLOR-CON M20) 20 MEQ tablet Take 1 tablet (20 mEq total) by mouth 2 (two) times daily.    . sildenafil (REVATIO) 20 MG tablet Take 1 tablet (20 mg total) by mouth 3 (three) times daily.  0  . tamoxifen (NOLVADEX) 20 MG tablet Take 1 tablet (20 mg total) by mouth daily. 90 tablet 3  . venlafaxine XR (EFFEXOR-XR) 37.5 MG 24 hr capsule TAKE ONE CAPSULE BY MOUTH DAILY 30 capsule 6   No current facility-administered medications for this encounter.    Allergies  Allergen Reactions  . Erythromycin Hives    Oramycin      Social History   Social History  . Marital Status: Single    Spouse Name: N/A  . Number of Children: N/A  . Years of Education: N/A   Occupational History  . retired    Social History Main Topics  . Smoking status: Former Smoker -- 1.00 packs/day for 16 years    Quit date: 10/11/1968  . Smokeless tobacco: Never Used  . Alcohol Use: Yes     Comment: last drink was 2 weeks - 8?16  . Drug Use: Yes    Special: Marijuana     Comment: last 1 year ago  . Sexual Activity: No   Other Topics Concern  . Not on file   Social History Narrative   Lives in [place] with dtr, son-in-law      Family History  Problem Relation Age of Onset  . CVA Mother     ?  Marland Kitchen Cancer Mother     unknown type of cancer  .  CVA Father     ?  Marland Kitchen Heart attack Neg Hx   . Stomach cancer Brother 77    stomach cancer    Filed Vitals:   09/03/15 1453  BP: 112/64  Pulse: 86  Weight: 233 lb 12.8 oz (106.051 kg)  SpO2: 98%    PHYSICAL EXAM: General: Elderly and chronically ill appearing. NAD HEENT: Bilateral eye redness with mild purulence. R>L. Neck: supple. JVP difficult to see (thick). Carotids 2+ bilat; no bruits. No thyromegaly noted. Cor: PMI nondisplaced. Irregularly irregular. No M/G/R noted. Lungs: Clear Abdomen: Obese, soft, nontender, nondistended, no  HSM. No bruits or masses. +BS Extremities: no cyanosis, clubbing, rash. 1+ ankle edema Neuro: alert & orientedx3, cranial nerves grossly intact. moves all 4 extremities w/o difficulty. Affect pleasant  ASSESSMENT & PLAN:  1. Chronic Diastolic CHF:   - Volume status relatively stable although weight up.  Agree with recent increase in Lasix. - Continue lasix 120 mg am 80 mg pm - Continue current coreg, hydralazine, and imdur. - Check labs 2. PAH - Transpulmonary gradient of 33 and PVR 8.25 last check. Peak PA pressure 82 echo 08/2015 in setting of volume overload. - Continue sildenafil 20 TID. Not candidate for repeat RHC at this point.  2. CAD s/p CABG:  No angina. Continue aspirin, statin, beta blocker. 3. Atrial Fibrillation:  Chronic. Rate controlled.  - Poor candidate for anticoag with hx alcohol abuse and noncompliance 4. Carotid Stenosis:  Stable bilateral on [place] 03/2015. F/u 03/2016. 6. HTN: Stable on current meds 7. Chronic Kidney Disease: BMET/BNP today. 8. Hyperlipidemia:  Continue statin. 9. Breast cancer: Status post mastectomy. He has been treated with tamoxifen. He is followed by oncology. 10. S/P SB resection: Stable   Labs today. Will prescribe sulfacetamide for likely blepharitis. Follow up in 2 weeks.  Legrand Como [address]" [place], PA-C 09/03/2015 3:44 PM   Patient seen and examined with Oda Kilts, PA-C. We discussed all aspects of the encounter. I agree with the assessment and plan as stated above.   Volume status much improved now that he is in SNF. Will continue current lasix dose. May need to increase (or switched to torsemide) once he goes back home. Will give sulfacetamide ointment for blepharitis. Check labs.   Bensimhon, Daniel,MD 7:06 PM

## 2015-09-17 ENCOUNTER — Telehealth (HOSPITAL_COMMUNITY): Payer: Self-pay | Admitting: Cardiology

## 2015-09-17 ENCOUNTER — Ambulatory Visit (HOSPITAL_COMMUNITY)
Admission: RE | Admit: 2015-09-17 | Discharge: 2015-09-17 | Disposition: A | Discharge status: Home / Self Care | Payer: No Typology Code available for payment source | Source: Ambulatory Visit | Attending: Internal Medicine | Admitting: Internal Medicine

## 2015-09-17 VITALS — BP 154/76 | HR 88 | Wt 250.8 lb

## 2015-09-17 DIAGNOSIS — Z8249 Family history of ischemic heart disease and other diseases of the circulatory system: Secondary | ICD-10-CM | POA: Diagnosis not present

## 2015-09-17 DIAGNOSIS — Z951 Presence of aortocoronary bypass graft: Secondary | ICD-10-CM | POA: Insufficient documentation

## 2015-09-17 DIAGNOSIS — F329 Major depressive disorder, single episode, unspecified: Secondary | ICD-10-CM | POA: Diagnosis not present

## 2015-09-17 DIAGNOSIS — N183 Chronic kidney disease, stage 3 (moderate): Secondary | ICD-10-CM | POA: Diagnosis not present

## 2015-09-17 DIAGNOSIS — Z853 Personal history of malignant neoplasm of breast: Secondary | ICD-10-CM | POA: Insufficient documentation

## 2015-09-17 DIAGNOSIS — M109 Gout, unspecified: Secondary | ICD-10-CM | POA: Diagnosis not present

## 2015-09-17 DIAGNOSIS — Z794 Long term (current) use of insulin: Secondary | ICD-10-CM | POA: Diagnosis not present

## 2015-09-17 DIAGNOSIS — I5033 Acute on chronic diastolic (congestive) heart failure: Secondary | ICD-10-CM | POA: Insufficient documentation

## 2015-09-17 DIAGNOSIS — Z9981 Dependence on supplemental oxygen: Secondary | ICD-10-CM | POA: Insufficient documentation

## 2015-09-17 DIAGNOSIS — Z823 Family history of stroke: Secondary | ICD-10-CM | POA: Insufficient documentation

## 2015-09-17 DIAGNOSIS — I482 Chronic atrial fibrillation, unspecified: Secondary | ICD-10-CM

## 2015-09-17 DIAGNOSIS — G4733 Obstructive sleep apnea (adult) (pediatric): Secondary | ICD-10-CM | POA: Diagnosis not present

## 2015-09-17 DIAGNOSIS — Z7982 Long term (current) use of aspirin: Secondary | ICD-10-CM | POA: Diagnosis not present

## 2015-09-17 DIAGNOSIS — J45909 Unspecified asthma, uncomplicated: Secondary | ICD-10-CM | POA: Diagnosis not present

## 2015-09-17 DIAGNOSIS — J449 Chronic obstructive pulmonary disease, unspecified: Secondary | ICD-10-CM | POA: Diagnosis not present

## 2015-09-17 DIAGNOSIS — E039 Hypothyroidism, unspecified: Secondary | ICD-10-CM | POA: Insufficient documentation

## 2015-09-17 DIAGNOSIS — E785 Hyperlipidemia, unspecified: Secondary | ICD-10-CM | POA: Diagnosis not present

## 2015-09-17 DIAGNOSIS — I159 Secondary hypertension, unspecified: Secondary | ICD-10-CM | POA: Diagnosis not present

## 2015-09-17 DIAGNOSIS — Z79899 Other long term (current) drug therapy: Secondary | ICD-10-CM | POA: Insufficient documentation

## 2015-09-17 DIAGNOSIS — I129 Hypertensive chronic kidney disease with stage 1 through stage 4 chronic kidney disease, or unspecified chronic kidney disease: Secondary | ICD-10-CM | POA: Diagnosis not present

## 2015-09-17 DIAGNOSIS — I2510 Atherosclerotic heart disease of native coronary artery without angina pectoris: Secondary | ICD-10-CM | POA: Insufficient documentation

## 2015-09-17 DIAGNOSIS — I272 Other secondary pulmonary hypertension: Secondary | ICD-10-CM | POA: Diagnosis not present

## 2015-09-17 DIAGNOSIS — Z881 Allergy status to other antibiotic agents status: Secondary | ICD-10-CM | POA: Diagnosis not present

## 2015-09-17 DIAGNOSIS — I481 Persistent atrial fibrillation: Secondary | ICD-10-CM | POA: Insufficient documentation

## 2015-09-17 DIAGNOSIS — R0902 Hypoxemia: Secondary | ICD-10-CM

## 2015-09-17 DIAGNOSIS — I6523 Occlusion and stenosis of bilateral carotid arteries: Secondary | ICD-10-CM | POA: Diagnosis not present

## 2015-09-17 DIAGNOSIS — Z8673 Personal history of transient ischemic attack (TIA), and cerebral infarction without residual deficits: Secondary | ICD-10-CM | POA: Diagnosis not present

## 2015-09-17 DIAGNOSIS — K219 Gastro-esophageal reflux disease without esophagitis: Secondary | ICD-10-CM | POA: Diagnosis not present

## 2015-09-17 DIAGNOSIS — Z87891 Personal history of nicotine dependence: Secondary | ICD-10-CM | POA: Insufficient documentation

## 2015-09-17 MED ORDER — TORSEMIDE 20 MG PO TABS: 60.0000 mg | ORAL_TABLET | Freq: Two times a day (BID) | ORAL | Status: DC

## 2015-09-17 NOTE — Telephone Encounter (Signed)
Nursing home is unable to get IV lasix for pts doses needed on 09/17/15 and 09/18/15   Will review with provider regarding further instructions

## 2015-09-17 NOTE — Progress Notes (Signed)
Patient ID: [name], male   DOB: 1946-10-09, 69 y.o.   MRN: [id number]   Advanced Heart Failure Clinic Note   Date:  09/17/2015   ID:  [name], DOB 1946/07/20, MRN [id number]  PCP:  [name] Every, MD  Cardiologist:  Dr. Lauree [name]   CHF:  Dr. Haroldine [name]   HPI: [name] is a 69 y.o. 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 Patient is not a Coumadin candidate secondary to history of alcohol abuse. He was admitted 04/2014 with acute on chronic diastolic CHF and developed cardiorenal syndrome requiring temporary hemodialysis.  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  Myoview 05/2014 was neg for ischemia.  He has a hx of breast CA and underwent mastectomy.   He has had 5 admission in the past 6 months as of 09/03/15.  In mid August, he was admitted with generalized weakness in the setting of metabolic derangement with severe hypokalemia and hypomagnesemia related to diuretic use as well as alcohol intake.   Admitted 8/22-8/29 for exploratory laparotomy with small bowel resection and incisional hernia repair with Dr. Ninfa [name]. He was discharged to inpatient rehabilitation. He developed volume excess while in inpatient rehabilitation requiring IV Lasix. Echocardiogram demonstrated normal LV function, mild aortic stenosis and moderate biatrial enlargement.   Admitted 9/20-9/26 with sepsis in the setting of lower extremity cellulitis. With fluid resuscitation, he developed acute on chronic diastolic CHF with pulmonary edema.   Admitted 08/11/15 with acute respiratory failure. He was treated for PNA with Zosyn.  Also had RUQ, CT 08/17/15 with no findings. Also noted to be volume overloaded. He initially had slow diuresis and HF team was consulted.  Had improved diuresis on IV lasix up to 80 mg IV  BID with acetazolamide 500 mg BID.  Discharge weight was 224 lb, and we increased lasix to 80 mg BID on d/c.   He returns for follow up with his daughter. Increased dyspnea with exertion. Denies PND/Orthopnea. Weight reported from SNF 221 pounds. Not on a particular diet. Per staff nurse at SNF he is drinking lots of fluids. Limited mobility. Can stand but requires assistance. All meds provided at [name]. Currently at [name].    Studies: - LHC (11/01): 3 vessel CAD >>> CABG - Nuclear (9/14): Inferolateral, no ischemia, not gated, probable low risk scan >>> med rx - Carotid US (5/15): Bilateral ICA 60-79%, >50% RECA- f/u 72-months - Echo (7/15): EF 50-55%, MAC, moderate to severe LAE, moderate RVH, moderate RAE - Nuclear (8/15):  Normal, EF 63% - Carotid US (6/16):  Bilateral ICA 40-59%; > 50% bilat ECA, L VA occluded, FU 1 year - Echo (06/12/15):  EF 60-65%, no RWMA, mild AS (mean 12 mmHg), MAC, mod LAE, mod RAE - Venous Duplex (07/03/15):  Neg for DVT bilaterally  - Echo 08/16/15 : EF 55-60%, Mild AS, RV moderately dilated, Mild/moderate reduction in RV function. Severe RAE. PA peak pressure 82  Allergies:   Erythromycin   Social History:  The patient  reports that he quit smoking about 46 years ago. He has never used smokeless tobacco. He reports that he drinks alcohol. He reports that he uses illicit drugs (Marijuana).   Family History:  The patient's family history includes CVA in his father and mother; Cancer in his mother; Stomach cancer (age of onset:  67) in his brother. There is no history of Heart attack.    Past Medical History  Diagnosis Date  . CHRONIC OBSTRUCTIVE PULMONARY DISEASE 06/20/2009  . CAROTID STENOSIS 06/20/2009    A. 08/2001 s/p L CEA;  B.   09/14/11 - Carotid U/S - 40-59% bilateral stenosis, left CEA patch angioplasty is patent  . DM 06/20/2009  . CAD 06/20/2009    A.  08/2000 - s/p CABG x 4 - LIMA-LAD, Left Radial-OM, VG-DIAG, VG-RCA;  B. Neg. MV  2010  .  HYPERLIPIDEMIA 06/20/2009  . HYPERTENSION 06/20/2009  . Hypothyroidism   . Low back pain   . Asthma     as child  . Pneumonia   . Persistent atrial fibrillation (HCC)     Not felt to be coumadin candidate 2/2 ETOH use.  Marland Kitchen History of tobacco abuse     remote - quit 1970  . Morbidly obese ([place])   . Chronic diastolic heart failure, NYHA class 3 ([place])     Followed by CHF Clinic --> Echo 04/2014: EF 99991111, Diastolic DysFxn - elevated LVEDP & LAP. Mod Dil LA & RA.  . Falls frequently   . Hx of cardiovascular stress test 05/2014    Lexiscan Myoview (8/15):  No ischemia; EF 63% - Normal Study  . Breast cancer, right breast ([place])   . Gout   . OBSTRUCTIVE SLEEP APNEA 06/20/2009    does not use cpap  . Stroke [name])     found on MRI in Oct. 2015.   Marland Kitchen Shortness of breath dyspnea     occasional  . Depression     takes Prozac  . GERD (gastroesophageal reflux disease)     takes Prilosec  . Arthritis   . Neuromuscular disorder (HCC)     neuropathy in both legs  . Constipation   . ETOH abuse     no alcohol since Oct. 2015  . 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 CHF (congestive heart failure) ([place])   . Supplemental oxygen dependent     hs  . Bilateral renal cysts   . Kidney disease     Stage 3  . Pulmonary HTN ([place])     a. [place] 11/16: severe pulmonary HTN, PASP 57, PA sat 42%, Ao sat 90%, CO 4.2 L/min, CI 1.88, PCWP mean 24, RA mean 14    Current Outpatient Prescriptions  Medication Sig Dispense Refill  . albuterol (PROVENTIL) (2.5 MG/3ML) 0.083% nebulizer solution Take 3 mLs (2.5 mg total) by nebulization [name] 2 (two) hours as needed for wheezing or shortness of breath.    Marland Kitchen aspirin EC 325 MG EC tablet Take 1 tablet (325 mg total) by mouth daily. 30 tablet 0  . atorvastatin (LIPITOR) 20 MG tablet Take 1 tablet (20 mg total) by mouth daily. 30 tablet 1  . carvedilol (COREG) 3.125 MG tablet Take 2 tablets (6.25 mg total) by mouth 2  (two) times daily with a meal. 60 tablet 1  . colchicine 0.6 MG tablet Take 0.6 mg by mouth daily.    Marland Kitchen FLUoxetine (PROZAC) 20 MG capsule Take 1 capsule (20 mg total) by mouth daily. 30 capsule 1  . fluticasone (FLOVENT HFA) 110 MCG/ACT inhaler Inhale 2 puffs into the lungs 2 (two) times daily.    . folic acid (FOLVITE) 1 MG tablet Take 1 tablet (1 mg total) by mouth daily. 30 tablet 1  . gabapentin (NEURONTIN) 300 MG  capsule Take 1 capsule (300 mg total) by mouth 2 (two) times daily. 60 capsule 1  . hydrALAZINE (APRESOLINE) 100 MG tablet Take 1 tablet (100 mg total) by mouth [name] 8 (eight) hours.    . insulin aspart (NOVOLOG) 100 UNIT/ML injection 0-9 Units, Subcutaneous, 3 times daily with meals, First dose on Tue 08/12/15 at 0800 Correction coverage: Sensitive (thin, NPO, renal) CBG < 70: implement hypoglycemia protocol CBG 70 - 120: 0 units CBG 121 - 150: 1 unit CBG 151 - 200: 2 units CBG 201 - 250: 3 units CBG 251 - 300: 5 units CBG 301 - 350: 7 units CBG 351 - 400: 9 units CBG > 400: call MD    . ipratropium-albuterol (DUONEB) 0.5-2.5 (3) MG/3ML SOLN Take 3 mLs by nebulization 2 (two) times daily.    . isosorbide dinitrate (ISORDIL) 10 MG tablet Take 1 tablet (10 mg total) by mouth 3 (three) times daily.    Marland Kitchen levothyroxine (SYNTHROID, LEVOTHROID) 125 MCG tablet Take 1.5 tablets (187 mcg total) by mouth daily before breakfast. 30 tablet 3  . magnesium oxide (MAG-OX) 400 (241.3 MG) MG tablet Take 1 tablet (400 mg total) by mouth 2 (two) times daily. 60 tablet 11  . omeprazole (PRILOSEC) 20 MG capsule Take 1 capsule (20 mg total) by mouth daily. 30 capsule 1  . Oxycodone HCl 10 MG TABS Take 1 tablet (10 mg total) by mouth 3 (three) times daily as needed (pain). 20 tablet 0  . potassium chloride SA (KLOR-CON M20) 20 MEQ tablet Take 1 tablet (20 mEq total) by mouth 2 (two) times daily.    . sildenafil (REVATIO) 20 MG tablet Take 1 tablet (20 mg total) by mouth 3 (three) times daily.  0  .  tamoxifen (NOLVADEX) 20 MG tablet Take 1 tablet (20 mg total) by mouth daily. 90 tablet 3  . venlafaxine XR (EFFEXOR-XR) 37.5 MG 24 hr capsule TAKE ONE CAPSULE BY MOUTH DAILY 30 capsule 6   No current facility-administered medications for this encounter.    Allergies  Allergen Reactions  . Erythromycin Hives    Oramycin      Social History   Social History  . Marital Status: Single    Spouse Name: N/A  . Number of Children: N/A  . Years of Education: N/A   Occupational History  . retired    Social History Main Topics  . Smoking status: Former Smoker -- 1.00 packs/day for 16 years    Quit date: 10/11/1968  . Smokeless tobacco: Never Used  . Alcohol Use: Yes     Comment: last drink was 2 weeks - 8?16  . Drug Use: Yes    Special: Marijuana     Comment: last 1 year ago  . Sexual Activity: No   Other Topics Concern  . Not on file   Social History Narrative   Lives in [place] with dtr, son-in-law      Family History  Problem Relation Age of Onset  . CVA Mother     ?  Marland Kitchen Cancer Mother     unknown type of cancer  . CVA Father     ?  Marland Kitchen Heart attack Neg Hx   . Stomach cancer Brother 18    stomach cancer    Filed Vitals:   09/17/15 1211  BP: 154/76  Pulse: 88  Weight: 250 lb 12.8 oz (113.762 kg)  SpO2: 85%    PHYSICAL EXAM: General: Elderly and chronically ill appearing. NAD. Arrive in wheel  chair with his daughter. Dyspneic getting on the scale.  HEENT: Normal  Neck: supple. JVP difficult to see (thick but appears elevated.  Carotids 2+ bilat; no bruits. No thyromegaly noted. Cor: PMI nondisplaced. Irregularly irregular. No M/G/R noted. Lungs: Crackles RML RLL LLL Abdomen: Obese, soft, nontender, nondistended, no HSM. No bruits or masses. +BS Extremities: no cyanosis, clubbing, rash. 2+ lower extremity  edema Neuro: alert & orientedx3, cranial nerves grossly intact. moves all 4 extremities w/o difficulty. Affect pleasant  ASSESSMENT & PLAN:  1.  Acute Chronic Diastolic CHF:   - NYHA IIIB. Volume status elevated. Weight up 17 pounds from last visit (2 weeks ago).  I spoke to [place] from [facility] and provided instruction for IV lasix as well as low salt diet and limiting fluid to less than 2 liters. Give  80 IV lasix today and tomorrow. I have also asked the facilty to check scale because we have a 24 pound weight difference.  Stop oral lasix . Start torsemide 60 mg twice a day on 12/9. May need diamox but will assess next week.  - Continue current coreg, hydralazine, and imdur. - Check BMET on Friday at [name].  2. PAH- Transpulmonary gradient of 33 and PVR 8.25 last check. Peak PA pressure 82 echo 08/2015 in setting of volume overload. - Continue sildenafil 20 TID. Not candidate for repeat RHC at this point.  2. CAD s/p CABG:  No angina. Continue aspirin, statin, beta blocker. 3. Atrial Fibrillation:  Chronic. Rate controlled.  - Poor candidate for anticoag with hx alcohol abuse and noncompliance 4. Carotid Stenosis:  Stable bilateral on [place] 03/2015. F/u 03/2016. 6. HTN: Elevated but today diuretics are being increased.  7. Chronic Kidney Disease: Check BMET on Friday.   8. Hyperlipidemia:  Continue statin. 9. Breast cancer: Status post mastectomy. He has been treated with tamoxifen. He is followed by oncology. 10. S/P SB resection: Stable 11. Hypoxia- O2 sats on room air 85%. But on 2 liters improved to 92%. Ask facility to place oxygen to maintain to  Oxygen sats > 88%  Follow up next week to reassess volume status if no improvement may need [name] admit.     Darrick Grinder, NP-C  12:13 PM

## 2015-09-17 NOTE — Patient Instructions (Signed)
STOP LASIX IV LASIX WILL BE GIVEN 09/17/15 AND 09/18/15 START Torsemide 60 mg, twice a day on Friday 09/19/15  Your physician recommends that you schedule a follow-up appointment in: [place] [facility]

## 2015-09-23 NOTE — Telephone Encounter (Signed)
ADDRESSED WITH NURSING DIRECTOR AND AMY CLEGG,NP

## 2015-09-25 ENCOUNTER — Ambulatory Visit (HOSPITAL_COMMUNITY)
Admission: RE | Admit: 2015-09-25 | Discharge: 2015-09-25 | Disposition: A | Discharge status: Home / Self Care | Payer: No Typology Code available for payment source | Source: Ambulatory Visit | Attending: Internal Medicine | Admitting: Internal Medicine

## 2015-09-25 VITALS — BP 130/60 | HR 82 | Wt 233.8 lb

## 2015-09-25 DIAGNOSIS — F1210 Cannabis abuse, uncomplicated: Secondary | ICD-10-CM | POA: Insufficient documentation

## 2015-09-25 DIAGNOSIS — E785 Hyperlipidemia, unspecified: Secondary | ICD-10-CM | POA: Diagnosis not present

## 2015-09-25 DIAGNOSIS — Z951 Presence of aortocoronary bypass graft: Secondary | ICD-10-CM | POA: Diagnosis present

## 2015-09-25 DIAGNOSIS — N183 Chronic kidney disease, stage 3 (moderate): Secondary | ICD-10-CM | POA: Diagnosis not present

## 2015-09-25 DIAGNOSIS — I480 Paroxysmal atrial fibrillation: Secondary | ICD-10-CM

## 2015-09-25 DIAGNOSIS — J449 Chronic obstructive pulmonary disease, unspecified: Secondary | ICD-10-CM | POA: Insufficient documentation

## 2015-09-25 DIAGNOSIS — I482 Chronic atrial fibrillation, unspecified: Secondary | ICD-10-CM

## 2015-09-25 DIAGNOSIS — Z87891 Personal history of nicotine dependence: Secondary | ICD-10-CM | POA: Diagnosis not present

## 2015-09-25 DIAGNOSIS — I129 Hypertensive chronic kidney disease with stage 1 through stage 4 chronic kidney disease, or unspecified chronic kidney disease: Secondary | ICD-10-CM | POA: Diagnosis present

## 2015-09-25 DIAGNOSIS — G4733 Obstructive sleep apnea (adult) (pediatric): Secondary | ICD-10-CM | POA: Insufficient documentation

## 2015-09-25 DIAGNOSIS — R0902 Hypoxemia: Secondary | ICD-10-CM | POA: Diagnosis not present

## 2015-09-25 DIAGNOSIS — I5032 Chronic diastolic (congestive) heart failure: Secondary | ICD-10-CM | POA: Diagnosis not present

## 2015-09-25 DIAGNOSIS — I2510 Atherosclerotic heart disease of native coronary artery without angina pectoris: Secondary | ICD-10-CM | POA: Diagnosis present

## 2015-09-25 DIAGNOSIS — I5033 Acute on chronic diastolic (congestive) heart failure: Secondary | ICD-10-CM

## 2015-09-25 DIAGNOSIS — Z853 Personal history of malignant neoplasm of breast: Secondary | ICD-10-CM | POA: Diagnosis not present

## 2015-09-25 LAB — BASIC METABOLIC PANEL
ANION GAP: 8 (ref 5–15)
BUN: 46 mg/dL — ABNORMAL HIGH (ref 6–20)
CHLORIDE: 97 mmol/L — AB (ref 101–111)
CO2: 32 mmol/L (ref 22–32)
CREATININE: 1.44 mg/dL — AB (ref 0.61–1.24)
Calcium: 8.8 mg/dL — ABNORMAL LOW (ref 8.9–10.3)
GFR calc non Af Amer: 47 mL/min — ABNORMAL LOW (ref >60)
GFR, EST AFRICAN AMERICAN: 55 mL/min — AB (ref >60)
Glucose, Bld: 137 mg/dL — ABNORMAL HIGH (ref 65–99)
POTASSIUM: 3.8 mmol/L (ref 3.5–5.1)
SODIUM: 137 mmol/L (ref 135–145)

## 2015-09-25 LAB — BRAIN NATRIURETIC PEPTIDE: B NATRIURETIC PEPTIDE 5: 365.1 pg/mL — AB (ref 0.0–100.0)

## 2015-09-25 NOTE — Progress Notes (Signed)
Advanced Heart Failure Medication Review by a Pharmacist  Does the patient  feel that his/her medications are working for him/her?  yes  Has the patient been experiencing any side effects to the medications prescribed?  no  Does the patient measure his/her own blood pressure or blood glucose at home?  yes   Does the patient have any problems obtaining medications due to transportation or finances?   no  Understanding of regimen: good Understanding of indications: good Potential of compliance: excellent Patient understands to avoid NSAIDs. Patient understands to avoid decongestants.  Issues to address at subsequent visits: None   Pharmacist comments:  Mr. Monterosso is a pleasant 69 yo M presenting from [facility] with an up-to-date MAR. All medications were reconciled and he did not have any specific medication-related questions or concerns for me at this time.   Ruta Hinds. Velva Harman, PharmD, BCPS, CPP Clinical Pharmacist Pager: [phone] Phone: [phone] 09/25/2015 12:29 PM      Time with patient: 8 minutes Preparation and documentation time: 2 minutes Total time: 10 minutes

## 2015-09-25 NOTE — Progress Notes (Signed)
Patient ID: MURVIN DUGGAN, male   DOB: June 26, 1946, 69 y.o.   MRN: [id number]   Advanced Heart Failure Clinic Note   Date:  09/25/2015   ID:  [name], DOB 1946/06/28, MRN [id number]  PCP:  Jene Every, MD  Cardiologist:  Dr. Lauree Chandler   CHF:  Dr. Haroldine Laws   HPI: [name] is a 69 y.o. 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 Patient is not a Coumadin candidate secondary to history of alcohol abuse. He was admitted 04/2014 with acute on chronic diastolic CHF and developed cardiorenal syndrome requiring temporary hemodialysis.  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  Myoview 05/2014 was neg for ischemia.  He has a hx of breast CA and underwent mastectomy.   He has had 5 admission in the past 6 months as of 09/03/15.  In mid August, he was admitted with generalized weakness in the setting of metabolic derangement with severe hypokalemia and hypomagnesemia related to diuretic use as well as alcohol intake.   Admitted 8/22-8/29 for exploratory laparotomy with small bowel resection and incisional hernia repair with Dr. Ninfa Linden. He was discharged to inpatient rehabilitation. He developed volume excess while in inpatient rehabilitation requiring IV Lasix. Echocardiogram demonstrated normal LV function, mild aortic stenosis and moderate biatrial enlargement.   Admitted 9/20-9/26 with sepsis in the setting of lower extremity cellulitis. With fluid resuscitation, he developed acute on chronic diastolic CHF with pulmonary edema.   Admitted 08/11/15 with acute respiratory failure. He was treated for PNA with Zosyn.  Also had RUQ, CT 08/17/15 with no findings. Also noted to be volume overloaded. He initially had slow diuresis and HF team was consulted.  Had improved diuresis on IV lasix up to 80 mg IV  BID with acetazolamide 500 mg BID.  Discharge weight was 224 lb, and we increased lasix to 80 mg BID on d/c.   He returns for follow up. Last visit lasix was stopped and he started on torsemide and given a dose of IV lasix.  Denies PND/Orthopnea.  Limited mobility but continues to work with PT. Requires assistance to stand.  Ongoing leg edema. All meds provided at [facility]. Currently at [facility].    Studies: - LHC (11/01): 3 vessel CAD >>> CABG - Nuclear (9/14): Inferolateral, no ischemia, not gated, probable low risk scan >>> med rx - Carotid US (5/15): Bilateral ICA 60-79%, >50% RECA- f/u 33-months - Echo (7/15): EF 50-55%, MAC, moderate to severe LAE, moderate RVH, moderate RAE - Nuclear (8/15):  Normal, EF 63% - Carotid US (6/16):  Bilateral ICA 40-59%; > 50% bilat ECA, L VA occluded, FU 1 year - Echo (06/12/15):  EF 60-65%, no RWMA, mild AS (mean 12 mmHg), MAC, mod LAE, mod RAE - Venous Duplex (07/03/15):  Neg for DVT bilaterally  - Echo 08/16/15 : EF 55-60%, Mild AS, RV moderately dilated, Mild/moderate reduction in RV function. Severe RAE. PA peak pressure 82  Allergies:   Erythromycin   Social History:  The patient  reports that he quit smoking about 46 years ago. He has never used smokeless tobacco. He reports that he drinks alcohol. He reports that he uses illicit drugs (Marijuana).   Family History:  The patient's family history includes CVA in his father and mother; Cancer in his mother; Stomach cancer (age of onset: 67)  in his brother. There is no history of Heart attack.    Past Medical History  Diagnosis Date  . CHRONIC OBSTRUCTIVE PULMONARY DISEASE 06/20/2009  . CAROTID STENOSIS 06/20/2009    A. 08/2001 s/p L CEA;  B.   09/14/11 - Carotid U/S - 40-59% bilateral stenosis, left CEA patch angioplasty is patent  . DM 06/20/2009  . CAD 06/20/2009    A.  08/2000 - s/p CABG x 4 - LIMA-LAD, Left Radial-OM, VG-DIAG, VG-RCA;  B. Neg. MV  2010  . HYPERLIPIDEMIA 06/20/2009  .  HYPERTENSION 06/20/2009  . Hypothyroidism   . Low back pain   . Asthma     as child  . Pneumonia   . Persistent atrial fibrillation (HCC)     Not felt to be coumadin candidate 2/2 ETOH use.  Marland Kitchen History of tobacco abuse     remote - quit 1970  . Morbidly obese ([place])   . Chronic diastolic heart failure, NYHA class 3 ([place])     Followed by CHF Clinic --> Echo 04/2014: EF 99991111, Diastolic DysFxn - elevated LVEDP & LAP. Mod Dil LA & RA.  . Falls frequently   . Hx of cardiovascular stress test 05/2014    Lexiscan Myoview (8/15):  No ischemia; EF 63% - Normal Study  . Breast cancer, right breast ([place])   . Gout   . OBSTRUCTIVE SLEEP APNEA 06/20/2009    does not use cpap  . Stroke [facility])     found on MRI in Oct. 2015.   Marland Kitchen Shortness of breath dyspnea     occasional  . Depression     takes Prozac  . GERD (gastroesophageal reflux disease)     takes Prilosec  . Arthritis   . Neuromuscular disorder (HCC)     neuropathy in both legs  . Constipation   . ETOH abuse     no alcohol since Oct. 2015  . 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 CHF (congestive heart failure) ([place])   . Supplemental oxygen dependent     hs  . Bilateral renal cysts   . Kidney disease     Stage 3  . Pulmonary HTN ([place])     a. [place] 11/16: severe pulmonary HTN, PASP 57, PA sat 42%, Ao sat 90%, CO 4.2 L/min, CI 1.88, PCWP mean 24, RA mean 14    Current Outpatient Prescriptions  Medication Sig Dispense Refill  . albuterol (PROVENTIL) (2.5 MG/3ML) 0.083% nebulizer solution Take 3 mLs (2.5 mg total) by nebulization every 2 (two) hours as needed for wheezing or shortness of breath.    Marland Kitchen aspirin EC 325 MG EC tablet Take 1 tablet (325 mg total) by mouth daily. 30 tablet 0  . atorvastatin (LIPITOR) 20 MG tablet Take 1 tablet (20 mg total) by mouth daily. 30 tablet 1  . carvedilol (COREG) 3.125 MG tablet Take 2 tablets (6.25 mg total) by mouth 2 (two) times daily with a meal.  60 tablet 1  . colchicine 0.6 MG tablet Take 0.6 mg by mouth daily.    Marland Kitchen FLUoxetine (PROZAC) 20 MG capsule Take 1 capsule (20 mg total) by mouth daily. 30 capsule 1  . fluticasone (FLOVENT HFA) 110 MCG/ACT inhaler Inhale 2 puffs into the lungs 2 (two) times daily.    . folic acid (FOLVITE) 1 MG tablet Take 1 tablet (1 mg total) by mouth daily. 30 tablet 1  . gabapentin (NEURONTIN) 300 MG capsule  Take 1 capsule (300 mg total) by mouth 2 (two) times daily. 60 capsule 1  . hydrALAZINE (APRESOLINE) 100 MG tablet Take 1 tablet (100 mg total) by mouth every 8 (eight) hours.    . insulin aspart (NOVOLOG) 100 UNIT/ML injection 0-9 Units, Subcutaneous, 3 times daily with meals, First dose on Tue 08/12/15 at 0800 Correction coverage: Sensitive (thin, NPO, renal) CBG < 70: implement hypoglycemia protocol CBG 70 - 120: 0 units CBG 121 - 150: 1 unit CBG 151 - 200: 2 units CBG 201 - 250: 3 units CBG 251 - 300: 5 units CBG 301 - 350: 7 units CBG 351 - 400: 9 units CBG > 400: call MD    . ipratropium-albuterol (DUONEB) 0.5-2.5 (3) MG/3ML SOLN Take 3 mLs by nebulization 2 (two) times daily.    . isosorbide dinitrate (ISORDIL) 10 MG tablet Take 1 tablet (10 mg total) by mouth 3 (three) times daily.    Marland Kitchen levothyroxine (SYNTHROID, LEVOTHROID) 125 MCG tablet Take 1.5 tablets (187 mcg total) by mouth daily before breakfast. 30 tablet 3  . magnesium oxide (MAG-OX) 400 (241.3 MG) MG tablet Take 1 tablet (400 mg total) by mouth 2 (two) times daily. 60 tablet 11  . omeprazole (PRILOSEC) 20 MG capsule Take 1 capsule (20 mg total) by mouth daily. 30 capsule 1  . Oxycodone HCl 10 MG TABS Take 1 tablet (10 mg total) by mouth 3 (three) times daily as needed (pain). 20 tablet 0  . potassium chloride SA (KLOR-CON M20) 20 MEQ tablet Take 1 tablet (20 mEq total) by mouth 2 (two) times daily.    . sildenafil (REVATIO) 20 MG tablet Take 1 tablet (20 mg total) by mouth 3 (three) times daily.  0  . tamoxifen (NOLVADEX) 20 MG  tablet Take 1 tablet (20 mg total) by mouth daily. 90 tablet 3  . torsemide (DEMADEX) 20 MG tablet Take 3 tablets (60 mg total) by mouth 2 (two) times daily. 90 tablet 3  . venlafaxine XR (EFFEXOR-XR) 37.5 MG 24 hr capsule TAKE ONE CAPSULE BY MOUTH DAILY 30 capsule 6   No current facility-administered medications for this encounter.    Allergies  Allergen Reactions  . Erythromycin Hives    Oramycin      Social History   Social History  . Marital Status: Single    Spouse Name: N/A  . Number of Children: N/A  . Years of Education: N/A   Occupational History  . retired    Social History Main Topics  . Smoking status: Former Smoker -- 1.00 packs/day for 16 years    Quit date: 10/11/1968  . Smokeless tobacco: Never Used  . Alcohol Use: Yes     Comment: last drink was 2 weeks - 8?16  . Drug Use: Yes    Special: Marijuana     Comment: last 1 year ago  . Sexual Activity: No   Other Topics Concern  . Not on file   Social History Narrative   Lives in [place] with dtr, son-in-law      Family History  Problem Relation Age of Onset  . CVA Mother     ?  Marland Kitchen Cancer Mother     unknown type of cancer  . CVA Father     ?  Marland Kitchen Heart attack Neg Hx   . Stomach cancer Brother 72    stomach cancer    Filed Vitals:   09/25/15 1216  BP: 130/60  Pulse: 82  Weight: 233 lb 12.8  oz (106.051 kg)  SpO2: 81%    PHYSICAL EXAM: General: Elderly and chronically ill appearing. NAD. Arrive in wheel chair. NAD   HEENT: Normal  Neck: supple. JVP difficult to see but does not appear elevated. Carotids 2+ bilat; no bruits. No thyromegaly noted. Cor: PMI nondisplaced. Irregularly irregular. No M/G/R noted. Lungs: Decreased in the bases.  Abdomen: Obese, soft, nontender, nondistended, no HSM. No bruits or masses. +BS Extremities: no cyanosis, clubbing, rash. R and LLE woody edema.  Neuro: alert & orientedx3, cranial nerves grossly intact. moves all 4 extremities w/o difficulty. Affect  pleasant  ASSESSMENT & PLAN:  1. Chronic Diastolic CHF:  - NYHA IIIB. Volume status improved but still with significant lower extremity edema.  Weight down 17 pounds. Continue torsemide 60 mg twice a day.  Continue current coreg, hydralazine, and imdur. - Check BMET today.  Ask SNF to place to  compression wraps wit unna boots. Change a couple of times a week. If he is discharged he will need HH + diuretic protocol from [facility]  2. PAH- Transpulmonary gradient of 33 and PVR 8.25 last check. Peak PA pressure 82 echo 08/2015 in setting of volume overload. - Continue sildenafil 20 TID. Not candidate for repeat RHC at this point.  2. CAD s/p CABG:  No angina. Continue aspirin, statin, beta blocker. 3. Atrial Fibrillation:  Chronic. Rate controlled.  - Poor candidate for anticoag with hx alcohol abuse and noncompliance 4. Carotid Stenosis:  Stable bilateral on [place] 03/2015. F/u 03/2016. 6. HTN: Elevated but today diuretics are being increased.  7. Chronic Kidney Disease: Check BMET on Friday.   8. Hyperlipidemia:  Continue statin. 9. Breast cancer: Status post mastectomy. He has been treated with tamoxifen. He is followed by oncology. 10. S/P SB resection: Stable 11. Hypoxia- Today oxygen saturations are above 88%. Continue oxygen as needed.en to maintain to  Oxygen sats > 88%  Follow up next week in 2-3 weeks.     Darrick Grinder, NP-C  12:19 PM

## 2015-09-25 NOTE — Patient Instructions (Signed)
Labs today  Your physician recommends that you schedule a follow-up appointment in: 2-3 weeks in the MD clinic  Do the following things EVERYDAY: 1) Weigh yourself in the morning before breakfast. Write it down and keep it in a log. 2) Take your medicines as prescribed 3) Eat low salt foods-Limit salt (sodium) to 2000 mg per day.  4) Stay as active as you can everyday 5) Limit all fluids for the day to less than 2 liters 6)

## 2015-09-26 ENCOUNTER — Non-Acute Institutional Stay (SKILLED_NURSING_FACILITY): Payer: Medicare Other | Admitting: Adult Health

## 2015-09-26 DIAGNOSIS — E1165 Type 2 diabetes mellitus with hyperglycemia: Secondary | ICD-10-CM | POA: Diagnosis not present

## 2015-09-26 DIAGNOSIS — E038 Other specified hypothyroidism: Secondary | ICD-10-CM

## 2015-09-26 DIAGNOSIS — I482 Chronic atrial fibrillation, unspecified: Secondary | ICD-10-CM

## 2015-09-26 DIAGNOSIS — I5033 Acute on chronic diastolic (congestive) heart failure: Secondary | ICD-10-CM

## 2015-09-26 DIAGNOSIS — E034 Atrophy of thyroid (acquired): Secondary | ICD-10-CM

## 2015-09-26 DIAGNOSIS — I63419 Cerebral infarction due to embolism of unspecified middle cerebral artery: Secondary | ICD-10-CM

## 2015-09-26 DIAGNOSIS — E1169 Type 2 diabetes mellitus with other specified complication: Secondary | ICD-10-CM | POA: Diagnosis not present

## 2015-09-26 DIAGNOSIS — E1151 Type 2 diabetes mellitus with diabetic peripheral angiopathy without gangrene: Secondary | ICD-10-CM | POA: Diagnosis not present

## 2015-09-26 DIAGNOSIS — E782 Mixed hyperlipidemia: Secondary | ICD-10-CM

## 2015-09-26 DIAGNOSIS — IMO0002 Reserved for concepts with insufficient information to code with codable children: Secondary | ICD-10-CM

## 2015-09-26 DIAGNOSIS — J439 Emphysema, unspecified: Secondary | ICD-10-CM

## 2015-09-26 DIAGNOSIS — E786 Lipoprotein deficiency: Secondary | ICD-10-CM | POA: Diagnosis not present

## 2015-09-26 DIAGNOSIS — I110 Hypertensive heart disease with heart failure: Secondary | ICD-10-CM

## 2015-09-26 DIAGNOSIS — J189 Pneumonia, unspecified organism: Secondary | ICD-10-CM | POA: Diagnosis not present

## 2015-09-26 DIAGNOSIS — E781 Pure hyperglyceridemia: Secondary | ICD-10-CM

## 2015-10-03 ENCOUNTER — Other Ambulatory Visit (HOSPITAL_COMMUNITY): Payer: Self-pay | Admitting: Internal Medicine

## 2015-10-11 ENCOUNTER — Encounter: Payer: Self-pay | Admitting: Adult Health

## 2015-10-11 DIAGNOSIS — J189 Pneumonia, unspecified organism: Secondary | ICD-10-CM | POA: Insufficient documentation

## 2015-10-11 NOTE — Progress Notes (Signed)
Patient ID: [name], male   DOB: 10/19/1945, 69 y.o.   MRN: [id number]    Facility: [facility]      Allergies  Allergen Reactions  . Erythromycin Hives    Oramycin    Chief Complaint  Patient presents with  . Medical Management of Chronic Issues    HPI:  He is a resident of this facility being seen for the management of his chronic illnesses. He has a cough with yellow sputum with increased shortness of breath present. There are no reports of fever present. No reports of weight gain present. We did apply the CHF vest with a reading of 51%; his body habitus is 38.1. I have spoken with Dr. Haroldine Laws regarding his status; we feels as though the high reading is related to high body habitus.    Past Medical History  Diagnosis Date  . CHRONIC OBSTRUCTIVE PULMONARY DISEASE 06/20/2009  . CAROTID STENOSIS 06/20/2009    A. 08/2001 s/p L CEA;  B.   09/14/11 - Carotid U/S - 40-59% bilateral stenosis, left CEA patch angioplasty is patent  . DM 06/20/2009  . CAD 06/20/2009    A.  08/2000 - s/p CABG x 4 - LIMA-LAD, Left Radial-OM, VG-DIAG, VG-RCA;  B. Neg. MV  2010  . HYPERLIPIDEMIA 06/20/2009  . HYPERTENSION 06/20/2009  . Hypothyroidism   . Low back pain   . Asthma     as child  . Pneumonia   . Persistent atrial fibrillation (HCC)     Not felt to be coumadin candidate 2/2 ETOH use.  Marland Kitchen History of tobacco abuse     remote - quit 1970  . Morbidly obese ([place])   . Chronic diastolic heart failure, NYHA class 3 ([place])     Followed by [facility] --> Echo 04/2014: EF [id number], Diastolic DysFxn - elevated LVEDP & LAP. Mod Dil LA & RA.  . Falls frequently   . Hx of cardiovascular stress test 05/2014    Lexiscan Myoview (8/15):  No ischemia; EF 63% - Normal Study  . Breast cancer, right breast ([place])   . Gout   . OBSTRUCTIVE SLEEP APNEA 06/20/2009    does not use cpap  . Stroke [facility])     found on MRI in Oct. 2015.   Marland Kitchen Shortness of breath dyspnea     occasional  . Depression     takes Prozac   . GERD (gastroesophageal reflux disease)     takes Prilosec  . Arthritis   . Neuromuscular disorder (HCC)     neuropathy in both legs  . Constipation   . ETOH abuse     no alcohol since Oct. 2015  . 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 CHF (congestive heart failure) ([place])   . Supplemental oxygen dependent     hs  . Bilateral renal cysts   . Kidney disease     Stage 3  . Pulmonary HTN ([place])     a. [place] 11/16: severe pulmonary HTN, PASP 57, PA sat 42%, Ao sat 90%, CO 4.2 L/min, CI 1.88, PCWP mean 24, RA mean 14    Past Surgical History  Procedure Laterality Date  . Carotid endarterectomy  2002    left  . Coronary artery bypass graft      x 4 - 2001  . Umbilical hernia repair N/A 01/28/2014    Procedure: HERNIA REPAIR UMBILICAL ADULT/INC;  Surgeon: Harl Bowie, MD;  Location: [facility];  Service: General;  Laterality: N/A;  . Laparotomy N/A 01/28/2014    Procedure: EXPLORATORY LAPAROTOMY;  Surgeon: Harl Bowie, MD;  Location: [place];  Service: General;  Laterality: N/A;  . Bowel resection N/A 01/28/2014    Procedure: SMALL BOWEL RESECTION;  Surgeon: Harl Bowie, MD;  Location: [place];  Service: General;  Laterality: N/A;  . Hernia repair    . Colonoscopy    . Total mastectomy Right 11/11/2014    Procedure: RIGHT TOTAL MASTECTOMY;  Surgeon: Excell Seltzer, MD;  Location: [place];  Service: General;  Laterality: Right;  . Cataract extraction w/phaco Left 04/08/2015    Procedure: CATARACT EXTRACTION PHACO AND INTRAOCULAR LENS PLACEMENT ([place]);  Surgeon: Birder Robson, MD;  Location: [facility];  Service: Ophthalmology;  Laterality: Left;  [place] 00:36 AP% 23.7 CDE 8.72 fluid pack [id number]  . Eye surgery Left     Catarct  . Laparotomy N/A 06/02/2015    Procedure: EXPLORATORY LAPAROTOMY;  Surgeon: Coralie Keens, MD;  Location: [place];  Service: General;  Laterality: N/A;  . Bowel resection N/A 06/02/2015     Procedure: SMALL BOWEL RESECTION;  Surgeon: Coralie Keens, MD;  Location: [place];  Service: General;  Laterality: N/A;  . Cardiac catheterization N/A 08/15/2015    Procedure: Right Heart Cath;  Surgeon: Jettie Booze, MD;  Location: [place] [facility];  Service: Cardiovascular;  Laterality: N/A;    VITAL SIGNS BP 155/84 mmHg  Pulse 98  Ht 5\' 7"  (1.702 m)  Wt 243 lb (110.224 kg)  BMI 38.05 kg/m2  SpO2 96%  Patient's Medications  New Prescriptions   No medications on file  Previous Medications   ALBUTEROL (PROVENTIL) (2.5 MG/3ML) 0.083% NEBULIZER SOLUTION    Take 3 mLs (2.5 mg total) by nebulization every 2 (two) hours as needed for wheezing or shortness of breath.   ALLOPURINOL (ZYLOPRIM) 300 MG TABLET    Take 300 mg by mouth daily.   ASPIRIN EC 325 MG EC TABLET    Take 1 tablet (325 mg total) by mouth daily.   ATORVASTATIN (LIPITOR) 20 MG TABLET    Take 1 tablet (20 mg total) by mouth daily.   CARVEDILOL (COREG) 3.125 MG TABLET    Take 2 tablets (6.25 mg total) by mouth 2 (two) times daily with a meal.   CETIRIZINE (ZYRTEC) 10 MG TABLET    Take 10 mg by mouth daily.   COLCHICINE 0.6 MG TABLET    Take 0.6 mg by mouth daily.   FLUOXETINE (PROZAC) 10 MG CAPSULE    Take 10 mg by mouth daily.   FLUTICASONE (FLOVENT HFA) 110 MCG/ACT INHALER    Inhale 2 puffs into the lungs 2 (two) times daily.   FOLIC ACID (FOLVITE) 1 MG TABLET    Take 1 tablet (1 mg total) by mouth daily.   GABAPENTIN (NEURONTIN) 300 MG CAPSULE    Take 1 capsule (300 mg total) by mouth 2 (two) times daily.   HYDRALAZINE (APRESOLINE) 100 MG TABLET    Take 1 tablet (100 mg total) by mouth every 8 (eight) hours.   INSULIN ASPART (NOVOLOG) 100 UNIT/ML INJECTION    0-9 Units, Subcutaneous, 3 times daily with meals, First dose on Tue 08/12/15 at 0800 Correction coverage: Sensitive (thin, NPO, renal) CBG < 70: implement hypoglycemia protocol CBG 70 - 120: 0 units CBG 121 - 150: 1 unit CBG 151 - 200: 2 units CBG 201 -  250: 3 units CBG 251 - 300:  5 units CBG 301 - 350: 7 units CBG 351 - 400: 9 units CBG > 400: call MD   IPRATROPIUM-ALBUTEROL (DUONEB) 0.5-2.5 (3) MG/3ML SOLN    Take 3 mLs by nebulization 2 (two) times daily.   ISOSORBIDE DINITRATE (ISORDIL) 10 MG TABLET    Take 1 tablet (10 mg total) by mouth 3 (three) times daily.   LEVOTHYROXINE (SYNTHROID, LEVOTHROID) 125 MCG TABLET    Take 1.5 tablets (187 mcg total) by mouth daily before breakfast.   MAGNESIUM OXIDE (MAG-OX) 400 (241.3 MG) MG TABLET    Take 1 tablet (400 mg total) by mouth 2 (two) times daily.   OMEPRAZOLE (PRILOSEC) 20 MG CAPSULE    Take 1 capsule (20 mg total) by mouth daily.   OXYCODONE HCL 10 MG TABS    Take 1 tablet (10 mg total) by mouth 3 (three) times daily as needed (pain).   POTASSIUM CHLORIDE SA (KLOR-CON M20) 20 MEQ TABLET    Take 1 tablet (20 mEq total) by mouth 2 (two) times daily.   SILDENAFIL (REVATIO) 20 MG TABLET    Take 1 tablet (20 mg total) by mouth 3 (three) times daily.   TAMOXIFEN (NOLVADEX) 20 MG TABLET    Take 1 tablet (20 mg total) by mouth daily.   TORSEMIDE (DEMADEX) 20 MG TABLET    Take 3 tablets (60 mg total) by mouth 2 (two) times daily.   VENLAFAXINE XR (EFFEXOR-XR) 75 MG 24 HR CAPSULE    Take 75 mg by mouth daily with breakfast.  Modified Medications   No medications on file  Discontinued Medications   No medications on file     SIGNIFICANT DIAGNOSTIC EXAMS   08-11-15: chest x-ray; 1. Atelectasis at the right lung base. Superimposed aspiration is unlikely based on the current appearance. 2. Stable cardiomegaly and mild chronic interstitial lung disease with interval mild pulmonary vascular congestion.    08-11-15: ct of head and cervical spine: 1. Mild left parietal scalp soft tissue swelling. No acute intracranial abnormality. 2. Advanced cervical spondylosis. Trace C3-4 is favored to be degenerative. Otherwise, no acute findings in the cervical spine identified. 3.  Cerebral atrophy and small  vessel ischemic change.  08-11-15: ct angio of chest: 1. Negative for acute pulmonary embolism 2. Confluent airspace opacities bilaterally, right worse than left. Infectious infiltrate is a leading consideration. Alveolar edema less likely but not excluded. There also are mildly prominent hilar and mediastinal nodes which may be reactive.   08-12-15: acute abdomen and chest x-ray: 1. Patchy airspace opacities bilaterally consistent with pneumonia. A small amount of pleural fluid is likely present on the right. 2. CHF with mild pulmonary vascular congestion. 3. No acute intra-abdominal abnormality is observed. Increased colonic stool burden may reflect constipation in the appropriate clinical setting.  08-12-15: mri of brain:1. Question 4 mm punctate focus of high signal intensity on DWI sequence within the posterior right frontal lobe. Finding favored to reflect artifact, although possible tiny acute ischemic infarct is not entirely excluded, and could be considered in the correct clinical setting. 2. No other acute intracranial process. 3. 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 4. Abnormal flow void within the distal left vertebral artery, which may be related to focal plaque or possibly chronic dissection. This is similar relative to prior study. 5. Age-related cerebral atrophy with mild to moderate chronic small vessel ischemic disease.  08-14-15: renal ultrasound: 1. Technically limited study secondary to patient body habitus and diffuse soft tissue edema.  2. The right kidney is very poorly seen.  No obvious hydronephrosis. 3. No left-sided hydronephrosis. 4. Simple renal cysts on the left.  08-15-15: right heart cath:    08-15-15: right heart cath: Severe pulmonary artery hypertension. Mean PA pressure 57 mm Hg.  PA sat 42%. Ao sat 90%. Cardiac output 4.2 L/min; Cardiac Index 1.88.  Mean PCWP: 24 mm Hg. mean RA pressure 14 mm Hg.     Medical therapy for his severe lung disease and associate pulmonary hypertension.     08-15-15: chest x-ray; 1. Slight improvement in density of RIGHT lower lobe atelectasis versus infiltrate. 2. No change in perihilar opacities representing mild edema or infection  08-16-15: TEE: - Left ventricle: The cavity size was normal. Wall thickness was increased in a pattern of mild LVH. Systolic function was normal. The estimated ejection fraction was in the range of 55% to 60%. - Aortic valve: Valve mobility was restricted. There was mild  stenosis.  - Mitral valve: Valve area by continuity equation  - Left atrium: The atrium was severely dilated. - Right ventricle: The cavity size was moderately dilated. Systolic function was mildly to moderately reduced. - Right atrium: The atrium was severely dilated. - Pulmonary arteries: Systolic pressure was severely increased. PA peak pressure: 82 mm Hg (S).  08-17-15: ct of abdomen and pelvis: 1. Small bilateral pleural effusions and overlying atelectasis. 2. Advanced three-vessel coronary artery calcifications and moderate atherosclerotic calcification involving the thoracic and abdominal aorta and branch vessels. 3. Numerous bilateral renal cysts appear stable. 4. No acute abdominal/pelvic findings, mass lesions or adenopathy. 5. Mild uniform bladder wall thickening without discrete mass. 6. Severe and progressive degenerative changes in the lower lumbar spine, particularly at L4-5.     LABS REVIEWED:   08-11-15: wbc 6.1; hgb 10.9; hct 37.1; mcv 101.9; plt 93; glucose 221; bun 41; creat 1.56; k+ 4.8; na++136; BNP 299.1 08-12-15: wbc 9.4; hgb 11.2; hct 37.5; mcv 99.7; plt 106; HIV: nr; tsh 0.976; vitamin B1: 146.3; ammonia 18; blood culture: no growth 08-13-15: glucose 155; bun 39; creat 1.67; k+ 3.9; na++135; hgb a1c 6.8 08-14-15; glucose 176; bun 47; creat 2.09; k+ 4.6; na++135; chol 108; ldl 65; trig 87; hdl 26; BNP 584.6 08-17-15: glucose 173; bun 39;  creat 1.94; k+ 3.7; na++140; liver normal albumin 2.7; ammonia 41 08-21-15: glucose 151; bun 31; creat 1.41; k+ 4.0; na++137; liver normal albumin 2.8        Review of Systems  Unable to perform ROS: other      Physical Exam  Constitutional: No distress.  Obese   Eyes: Conjunctivae are normal.  Neck: Neck supple. No JVD present. No thyromegaly present.  Cardiovascular: Normal rate, regular rhythm and intact distal pulses.   Respiratory: Effort normal and breath sounds normal. No respiratory distress. He has no wheezes.  GI: Soft. Bowel sounds are normal. He exhibits no distension. There is no tenderness.  Musculoskeletal: He exhibits no edema.  Able to move all extremities   Lymphadenopathy:    He has no cervical adenopathy.  Neurological: He is alert.  Skin: Skin is warm and dry. He is not diaphoretic.  Has bilateral unna boots in place  On abdomen has scabbed area present on old incision line   Psychiatric: He has a normal mood and affect.       ASSESSMENT/ PLAN:  1. Acute on chronic diastolic heart failure: EF is 55-60%; will continue daily weights;  will continue coreg 3.125 mg twice daily hydralazine 100 mg  every 8 hours will monitor will change the demadex to 80 mg twice daily will give diamox 500 mg twice daily for 3 days   2. Paroxysmal atrial fib: heart rate is stable will continue asa 325 mg daily takes coreg 3.125 mg twice daily will monitor  3. Acute respiratory failure with hypoxia: will albuterol neb every 2 hours as needed; flovent 2 puffs twice daily will change his duoneb to every 6 hours for one week then return to twice daily   4. Gout: no recent flares: will continue allopurinol 300 mg daily and colchicine 0.6 mg daily will monitor  5. Diabetes: will continue novolog SSI: his hgb a1c is 6.8  6. Dyslipidemia: will continue lipitor 20 mg daily his ldl is 65  7. Hypothyroidism: will continue 187 mcg daily his tsh is 0.976  8. Peripheral neuropathy:  will continue neurontin 300 mg twice daily   9. CAD: Is status post cabg X 4 vessels: recent right heart cath: no complaints of chest pain present; will continue isordil 10 mg three times daily   10 . GERD: will continue prilosec 20 mg daily   11. Pulmonary hypertension: will continue revatio 20 mg three times daily  The TEE on 08-16-15; demonstrated pulmonary artery pressure of 82 mm Hg  12. Male breast cancer status post right mastectomy Feb 2016; will continue tamoxifen 20 mg daily   13. Depression will continue prozac 20 mg daily with effexor xr 37.5 mg daily   14. Hypomagnesemia: will continue mag ox 400 mg twice daily   15. Acute CVA: is neurologically stable will continue asa 325 mg daily   16. Alcohol abuse: has not consumed alcohol in the past 2-3 months will monitor   17. Pnuemonia: will begin augmentin 875 mg twice daily for 14 days with florastor twice daily will monitor his status    Time spent with patient 50   minutes >50% time spent counseling; reviewing medical record; tests; labs; and developing future plan of care         Ok Edwards NP [facility]  Contact [phone] Monday through Friday 8am- 5pm  After hours call [phone]

## 2015-10-14 ENCOUNTER — Encounter: Payer: Self-pay | Admitting: Internal Medicine

## 2015-10-14 ENCOUNTER — Non-Acute Institutional Stay (SKILLED_NURSING_FACILITY): Payer: Medicare Other | Admitting: Internal Medicine

## 2015-10-14 DIAGNOSIS — E1165 Type 2 diabetes mellitus with hyperglycemia: Secondary | ICD-10-CM

## 2015-10-14 DIAGNOSIS — IMO0002 Reserved for concepts with insufficient information to code with codable children: Secondary | ICD-10-CM

## 2015-10-14 DIAGNOSIS — E1151 Type 2 diabetes mellitus with diabetic peripheral angiopathy without gangrene: Secondary | ICD-10-CM

## 2015-10-14 DIAGNOSIS — R635 Abnormal weight gain: Secondary | ICD-10-CM

## 2015-10-14 DIAGNOSIS — I482 Chronic atrial fibrillation, unspecified: Secondary | ICD-10-CM

## 2015-10-14 DIAGNOSIS — Z853 Personal history of malignant neoplasm of breast: Secondary | ICD-10-CM | POA: Diagnosis not present

## 2015-10-14 DIAGNOSIS — R938 Abnormal findings on diagnostic imaging of other specified body structures: Secondary | ICD-10-CM | POA: Diagnosis not present

## 2015-10-14 DIAGNOSIS — I5032 Chronic diastolic (congestive) heart failure: Secondary | ICD-10-CM

## 2015-10-14 DIAGNOSIS — J439 Emphysema, unspecified: Secondary | ICD-10-CM | POA: Diagnosis not present

## 2015-10-14 DIAGNOSIS — J948 Other specified pleural conditions: Secondary | ICD-10-CM | POA: Diagnosis not present

## 2015-10-14 DIAGNOSIS — I63419 Cerebral infarction due to embolism of unspecified middle cerebral artery: Secondary | ICD-10-CM | POA: Diagnosis not present

## 2015-10-14 DIAGNOSIS — R9389 Abnormal findings on diagnostic imaging of other specified body structures: Secondary | ICD-10-CM

## 2015-10-14 DIAGNOSIS — J90 Pleural effusion, not elsewhere classified: Secondary | ICD-10-CM

## 2015-10-14 NOTE — Progress Notes (Signed)
Patient ID: [name], male   DOB: 1945-10-18, 70 y.o.   MRN: [id number]    DATE: 10/14/15  Location:  [facility]    Place of Service: SNF [phone])   Extended Emergency Contact Information Primary Emergency Contact: Hardy,[name] Address: [address]          [place], [place] [address] Johnnette Litter of [place] Phone: [phone] Mobile Phone: [phone] Relation: Daughter  Advanced Directive information  FULL CODE  Chief Complaint  Patient presents with  . Acute Visit    swelling    HPI:  70 yo male seen today for LE swelling. He is a poor historian due to dementia. Hx obtained from nursing. Weight has increased 6lbs suddenly and he has gained 37 lbs since admission to SNF in Nov 2016. Zaroxolyn started on yesterday. He was tx with Augmentin for pneumonia a few weeks ago.   Acute on chronic diastolic heart failure - EF is 55-60%; gets daily weights;  Takes coreg 3.125 mg twice daily; hydralazine 100 mg every 8 hours; demadex increased to 80 mg twice daily yesterday and diamox 500 mg added twice daily for 3 days   PAF -  heart rate is stable; takes asa 325 mg daily; coreg 3.125 mg twice daily  Recent Acute respiratory failure with hypoxia - on albuterol neb every 2 hours as needed; flovent 2 puffs twice daily; duoneb  twice daily   DM - stable on novolog SSI. A1c 6.8%  Dyslipidemia - stable on lipitor 20 mg daily. LDL 65  Hypothyroidism - levothyroxine 187 mcg daily. TSH  0.976  CAD - s/p cabg X 4 vessels. Had a  recent right heart cath. Currently no complaints of chest pain present; takes isordil 10 mg three times daily   Pulmonary hypertension - takes revatio 20 mg three times daily; TEE on 08-16-15 demonstrated pulmonary artery pressure of 82 mm Hg  Male breast cancer status post right mastectomy Feb 2016 - takes tamoxifen 20 mg daily. Followed by oncology  Depression - stable on prozac 20 mg daily with effexor xr 37.5 mg daily   Hx CVA -  neurologically stable on asa 325 mg daily   Hx Alcohol abuse - has not consumed alcohol in the past 2-3 months    Past Medical History  Diagnosis Date  . CHRONIC OBSTRUCTIVE PULMONARY DISEASE 06/20/2009  . CAROTID STENOSIS 06/20/2009    A. 08/2001 s/p L CEA;  B.   09/14/11 - Carotid U/S - 40-59% bilateral stenosis, left CEA patch angioplasty is patent  . DM 06/20/2009  . CAD 06/20/2009    A.  08/2000 - s/p CABG x 4 - LIMA-LAD, Left Radial-OM, VG-DIAG, VG-RCA;  B. Neg. MV  2010  . HYPERLIPIDEMIA 06/20/2009  . HYPERTENSION 06/20/2009  . Hypothyroidism   . Low back pain   . Asthma     as child  . Pneumonia   . Persistent atrial fibrillation (HCC)     Not felt to be coumadin candidate 2/2 ETOH use.  Marland Kitchen History of tobacco abuse     remote - quit 1970  . Morbidly obese ([place])   . Chronic diastolic heart failure, NYHA class 3 ([place])     Followed by CHF Clinic --> Echo 04/2014: EF 27-78%, Diastolic DysFxn - elevated LVEDP & LAP. Mod Dil LA & RA.  . Falls frequently   . Hx of cardiovascular stress test 05/2014    Lexiscan Myoview (8/15):  No ischemia; EF 63% - Normal Study  .  Breast cancer, right breast ([place])   . Gout   . OBSTRUCTIVE SLEEP APNEA 06/20/2009    does not use cpap  . Stroke [facility])     found on MRI in Oct. 2015.   Marland Kitchen Shortness of breath dyspnea     occasional  . Depression     takes Prozac  . GERD (gastroesophageal reflux disease)     takes Prilosec  . Arthritis   . Neuromuscular disorder (HCC)     neuropathy in both legs  . Constipation   . ETOH abuse     no alcohol since Oct. 2015  . 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 CHF (congestive heart failure) ([place])   . Supplemental oxygen dependent     hs  . Bilateral renal cysts   . Kidney disease     Stage 3  . Pulmonary HTN ([place])     a. [place] 11/16: severe pulmonary HTN, PASP 57, PA sat 42%, Ao sat 90%, CO 4.2 L/min, CI 1.88, PCWP mean 24, RA mean 14    Past Surgical  History  Procedure Laterality Date  . Carotid endarterectomy  2002    left  . Coronary artery bypass graft      x 4 - 2001  . Umbilical hernia repair N/A 01/28/2014    Procedure: HERNIA REPAIR UMBILICAL ADULT/INC;  Surgeon: Harl Bowie, MD;  Location: [place];  Service: General;  Laterality: N/A;  . Laparotomy N/A 01/28/2014    Procedure: EXPLORATORY LAPAROTOMY;  Surgeon: Harl Bowie, MD;  Location: [place];  Service: General;  Laterality: N/A;  . Bowel resection N/A 01/28/2014    Procedure: SMALL BOWEL RESECTION;  Surgeon: Harl Bowie, MD;  Location: [place];  Service: General;  Laterality: N/A;  . Hernia repair    . Colonoscopy    . Total mastectomy Right 11/11/2014    Procedure: RIGHT TOTAL MASTECTOMY;  Surgeon: Excell Seltzer, MD;  Location: [place];  Service: General;  Laterality: Right;  . Cataract extraction w/phaco Left 04/08/2015    Procedure: CATARACT EXTRACTION PHACO AND INTRAOCULAR LENS PLACEMENT ([place]);  Surgeon: Birder Robson, MD;  Location: [facility];  Service: Ophthalmology;  Laterality: Left;  [place] 00:36 AP% 23.7 CDE 8.72 fluid pack ULA#[id number] H  . Eye surgery Left     Catarct  . Laparotomy N/A 06/02/2015    Procedure: EXPLORATORY LAPAROTOMY;  Surgeon: Coralie Keens, MD;  Location: [place];  Service: General;  Laterality: N/A;  . Bowel resection N/A 06/02/2015    Procedure: SMALL BOWEL RESECTION;  Surgeon: Coralie Keens, MD;  Location: [place];  Service: General;  Laterality: N/A;  . Cardiac catheterization N/A 08/15/2015    Procedure: Right Heart Cath;  Surgeon: Jettie Booze, MD;  Location: [place] [facility];  Service: Cardiovascular;  Laterality: N/A;    Patient Care Team: Jene Every, MD as PCP - General (Family Medicine)  Social History   Social History  . Marital Status: Single    Spouse Name: N/A  . Number of Children: N/A  . Years of Education: N/A   Occupational History  . retired    Social History Main Topics  . Smoking status:  Former Smoker -- 1.00 packs/day for 16 years    Quit date: 10/11/1968  . Smokeless tobacco: Never Used  . Alcohol Use: Yes     Comment: last drink was 2 weeks - 8?16  . Drug Use: Yes  Special: Marijuana     Comment: last 1 year ago  . Sexual Activity: No   Other Topics Concern  . Not on file   Social History Narrative   Lives in [place] with dtr, son-in-law     reports that he quit smoking about 47 years ago. He has never used smokeless tobacco. He reports that he drinks alcohol. He reports that he uses illicit drugs (Marijuana).  Family History  Problem Relation Age of Onset  . CVA Mother     ?  Marland Kitchen Cancer Mother     unknown type of cancer  . CVA Father     ?  Marland Kitchen Heart attack Neg Hx   . Stomach cancer Brother 87    stomach cancer   Family Status  Relation Status Death Age  . Mother Deceased     ? cva  . Father Deceased     ? cva  . Brother Alive   . Sister Alive   . Brother Deceased   . Daughter Alive   . Maternal Grandmother Deceased   . Maternal Grandfather Deceased   . Paternal Grandmother Deceased   . Paternal Grandfather Deceased     Immunization History  Administered Date(s) Administered  . Influenza,inj,Quad PF,36+ Mos 07/25/2014  . Pneumococcal Conjugate-13 10/07/2011    Allergies  Allergen Reactions  . Erythromycin Hives    Oramycin    Medications: Patient's Medications  New Prescriptions   No medications on file  Previous Medications   ALBUTEROL (PROVENTIL) (2.5 MG/3ML) 0.083% NEBULIZER SOLUTION    Take 3 mLs (2.5 mg total) by nebulization every 2 (two) hours as needed for wheezing or shortness of breath.   ALLOPURINOL (ZYLOPRIM) 300 MG TABLET    Take 300 mg by mouth daily.   ASPIRIN EC 325 MG EC TABLET    Take 1 tablet (325 mg total) by mouth daily.   ATORVASTATIN (LIPITOR) 20 MG TABLET    Take 1 tablet (20 mg total) by mouth daily.   CARVEDILOL (COREG) 3.125 MG TABLET    Take 2 tablets (6.25 mg total) by mouth 2 (two) times daily with  a meal.   CETIRIZINE (ZYRTEC) 10 MG TABLET    Take 10 mg by mouth daily.   COLCHICINE 0.6 MG TABLET    Take 0.6 mg by mouth daily.   FLUOXETINE (PROZAC) 10 MG CAPSULE    Take 10 mg by mouth daily.   FLUTICASONE (FLOVENT HFA) 110 MCG/ACT INHALER    Inhale 2 puffs into the lungs 2 (two) times daily.   FOLIC ACID (FOLVITE) 1 MG TABLET    Take 1 tablet (1 mg total) by mouth daily.   GABAPENTIN (NEURONTIN) 300 MG CAPSULE    Take 1 capsule (300 mg total) by mouth 2 (two) times daily.   HYDRALAZINE (APRESOLINE) 100 MG TABLET    Take 1 tablet (100 mg total) by mouth every 8 (eight) hours.   INSULIN ASPART (NOVOLOG) 100 UNIT/ML INJECTION    0-9 Units, Subcutaneous, 3 times daily with meals, First dose on Tue 08/12/15 at 0800 Correction coverage: Sensitive (thin, NPO, renal) CBG < 70: implement hypoglycemia protocol CBG 70 - 120: 0 units CBG 121 - 150: 1 unit CBG 151 - 200: 2 units CBG 201 - 250: 3 units CBG 251 - 300: 5 units CBG 301 - 350: 7 units CBG 351 - 400: 9 units CBG > 400: call MD   IPRATROPIUM-ALBUTEROL (DUONEB) 0.5-2.5 (3) MG/3ML SOLN    Take 3 mLs  by nebulization 2 (two) times daily.   ISOSORBIDE DINITRATE (ISORDIL) 10 MG TABLET    Take 1 tablet (10 mg total) by mouth 3 (three) times daily.   LEVOTHYROXINE (SYNTHROID, LEVOTHROID) 125 MCG TABLET    Take 1.5 tablets (187 mcg total) by mouth daily before breakfast.   MAGNESIUM OXIDE (MAG-OX) 400 (241.3 MG) MG TABLET    Take 1 tablet (400 mg total) by mouth 2 (two) times daily.   OMEPRAZOLE (PRILOSEC) 20 MG CAPSULE    Take 1 capsule (20 mg total) by mouth daily.   OXYCODONE HCL 10 MG TABS    Take 1 tablet (10 mg total) by mouth 3 (three) times daily as needed (pain).   POTASSIUM CHLORIDE SA (KLOR-CON M20) 20 MEQ TABLET    Take 1 tablet (20 mEq total) by mouth 2 (two) times daily.   SILDENAFIL (REVATIO) 20 MG TABLET    Take 1 tablet (20 mg total) by mouth 3 (three) times daily.   TAMOXIFEN (NOLVADEX) 20 MG TABLET    Take 1 tablet (20 mg  total) by mouth daily.   TORSEMIDE (DEMADEX) 20 MG TABLET    Take 3 tablets (60 mg total) by mouth 2 (two) times daily.   VENLAFAXINE XR (EFFEXOR-XR) 75 MG 24 HR CAPSULE    Take 75 mg by mouth daily with breakfast.  Modified Medications   No medications on file  Discontinued Medications   No medications on file    Review of Systems  Unable to perform ROS: Dementia    Filed Vitals:   10/14/15 1650  BP: 106/62  Pulse: 74  Temp: 97.8 F (36.6 C)  Weight: 265 lb (120.203 kg)  SpO2: 94%   Body mass index is 41.5 kg/(m^2).  Physical Exam  Constitutional: He appears well-developed and well-nourished. No distress.  Cardiovascular: Normal rate and regular rhythm.  Frequent extrasystoles are present.  Murmur heard.  Systolic murmur is present with a grade of 1/6  +1 pitting LE edema. B/l leg dressing c/d/i. No calf TTP  Pulmonary/Chest: Effort normal. He has no wheezes. He has rales.  Abdominal: Soft. Bowel sounds are normal. He exhibits no distension and no mass. There is no tenderness. There is no rebound and no guarding.  Musculoskeletal: He exhibits edema and tenderness.  Neurological: He is alert.  Skin: Skin is warm and dry.  B/l leg dressing intact  Psychiatric: He has a normal mood and affect. He is agitated.     Labs reviewed: Hospital Outpatient Visit on 09/25/2015  Component Date Value Ref Range Status  . Sodium 09/25/2015 137  135 - 145 mmol/L Final  . Potassium 09/25/2015 3.8  3.5 - 5.1 mmol/L Final  . Chloride 09/25/2015 97* 101 - 111 mmol/L Final  . CO2 09/25/2015 32  22 - 32 mmol/L Final  . Glucose, Bld 09/25/2015 137* 65 - 99 mg/dL Final  . BUN 09/25/2015 46* 6 - 20 mg/dL Final  . Creatinine, Ser 09/25/2015 1.44* 0.61 - 1.24 mg/dL Final  . Calcium 09/25/2015 8.8* 8.9 - 10.3 mg/dL Final  . GFR calc non Af Amer 09/25/2015 47* >60 mL/min Final  . GFR calc Af Amer 09/25/2015 55* >60 mL/min Final   Comment: (NOTE) The eGFR has been calculated using the CKD EPI  equation. This calculation has not been validated in all clinical situations. eGFR's persistently <60 mL/min signify possible Chronic Kidney Disease.   . Anion gap 09/25/2015 8  5 - 15 Final  . B Natriuretic Peptide 09/25/2015 365.1* 0.0 -  100.0 pg/mL Final  Hospital Outpatient Visit on 09/03/2015  Component Date Value Ref Range Status  . Sodium 09/03/2015 137  135 - 145 mmol/L Final  . Potassium 09/03/2015 4.7  3.5 - 5.1 mmol/L Final  . Chloride 09/03/2015 105  101 - 111 mmol/L Final  . CO2 09/03/2015 24  22 - 32 mmol/L Final  . Glucose, Bld 09/03/2015 133* 65 - 99 mg/dL Final  . BUN 09/03/2015 23* 6 - 20 mg/dL Final  . Creatinine, Ser 09/03/2015 1.03  0.61 - 1.24 mg/dL Final  . Calcium 09/03/2015 9.1  8.9 - 10.3 mg/dL Final  . GFR calc non Af Amer 09/03/2015 >60  >60 mL/min Final  . GFR calc Af Amer 09/03/2015 >60  >60 mL/min Final   Comment: (NOTE) The eGFR has been calculated using the CKD EPI equation. This calculation has not been validated in all clinical situations. eGFR's persistently <60 mL/min signify possible Chronic Kidney Disease.   . Anion gap 09/03/2015 8  5 - 15 Final  . B Natriuretic Peptide 09/03/2015 395.2* 0.0 - 100.0 pg/mL Final  Admission on 08/11/2015, Discharged on 08/22/2015  No results displayed because visit has over 200 results.    Office Visit on 07/22/2015  Component Date Value Ref Range Status  . Sodium 07/22/2015 134* 135 - 146 mmol/L Final  . Potassium 07/22/2015 4.2  3.5 - 5.3 mmol/L Final  . Chloride 07/22/2015 90* 98 - 110 mmol/L Final  . CO2 07/22/2015 34* 20 - 31 mmol/L Final  . Glucose, Bld 07/22/2015 222* 65 - 99 mg/dL Final  . BUN 07/22/2015 59* 7 - 25 mg/dL Final  . Creat 07/22/2015 1.34* 0.70 - 1.18 mg/dL Final  . Calcium 07/22/2015 9.0  8.6 - 10.3 mg/dL Final  Office Visit on 07/15/2015  Component Date Value Ref Range Status  . Brain Natriuretic Peptide 07/15/2015 208.8* 0.0 - 100.0 pg/mL Final  . Sodium 07/15/2015 139  135 - 146  mmol/L Final  . Potassium 07/15/2015 4.7  3.5 - 5.3 mmol/L Final  . Chloride 07/15/2015 95* 98 - 110 mmol/L Final  . CO2 07/15/2015 32* 20 - 31 mmol/L Final  . Glucose, Bld 07/15/2015 132* 65 - 99 mg/dL Final  . BUN 07/15/2015 51* 7 - 25 mg/dL Final  . Creat 07/15/2015 1.73* 0.70 - 1.18 mg/dL Final  . Calcium 07/15/2015 9.0  8.6 - 10.3 mg/dL Final    No results found. CXR 10/14/15 - persistent bibasilar infiltrates and left pleural effusion  Assessment/Plan   ICD-9-CM ICD-10-CM   1. Pleural effusion, left 511.9 J94.8   2. Abnormal CXR 793.2 R93.8   3. Weight gain, abnormal 783.1 R63.5   4. DIASTOLIC HEART FAILURE, CHRONIC 428.32 I50.32   5. Chronic atrial fibrillation (HCC) 427.31 I48.2   6. Pulmonary emphysema, unspecified emphysema type ([place]) 492.8 J43.9   7. Cerebrovascular accident (CVA) due to embolism of middle cerebral artery, unspecified blood vessel laterality ([place]) 434.11 I63.419   8. DM (diabetes mellitus) type II uncontrolled, periph vascular disorder ([place]) - improved on SSI 250.72 E11.51    443.81 E11.65   9. Hx right male breast CA s/p mastectomy in 2016   Check CBC w diff and BMP  Pulmonary eval  F/u with oncology as scheduled  Daily weight x 2 weeks  Cont current meds as ordered  Will follow  Branndon Tuite S. Perlie Gold  [facility] [address] [place], [place] [address] [phone] Cell (Monday-Friday 8  AM - 5 PM) [phone] After 5 PM and follow prompts

## 2015-10-16 ENCOUNTER — Ambulatory Visit (HOSPITAL_BASED_OUTPATIENT_CLINIC_OR_DEPARTMENT_OTHER)
Admission: RE | Admit: 2015-10-16 | Discharge: 2015-10-16 | Disposition: A | Discharge status: Home / Self Care | Payer: Medicare Other | Source: Ambulatory Visit | Attending: Adult Health | Admitting: Adult Health

## 2015-10-16 ENCOUNTER — Inpatient Hospital Stay (HOSPITAL_COMMUNITY)
Admission: EM | Admit: 2015-10-16 | Discharge: 2015-10-23 | DRG: 304 | Disposition: A | Discharge status: Hospice | Payer: Medicare Other | Attending: Internal Medicine | Admitting: Internal Medicine

## 2015-10-16 ENCOUNTER — Other Ambulatory Visit: Payer: Self-pay

## 2015-10-16 ENCOUNTER — Emergency Department (HOSPITAL_COMMUNITY): Payer: Medicare Other

## 2015-10-16 VITALS — BP 112/58 | HR 74 | Wt 256.4 lb

## 2015-10-16 DIAGNOSIS — J45909 Unspecified asthma, uncomplicated: Secondary | ICD-10-CM | POA: Diagnosis present

## 2015-10-16 DIAGNOSIS — K761 Chronic passive congestion of liver: Secondary | ICD-10-CM | POA: Diagnosis present

## 2015-10-16 DIAGNOSIS — N183 Chronic kidney disease, stage 3 (moderate): Secondary | ICD-10-CM | POA: Diagnosis present

## 2015-10-16 DIAGNOSIS — Z9981 Dependence on supplemental oxygen: Secondary | ICD-10-CM

## 2015-10-16 DIAGNOSIS — Z6838 Body mass index (BMI) 38.0-38.9, adult: Secondary | ICD-10-CM | POA: Diagnosis not present

## 2015-10-16 DIAGNOSIS — J440 Chronic obstructive pulmonary disease with acute lower respiratory infection: Secondary | ICD-10-CM | POA: Diagnosis present

## 2015-10-16 DIAGNOSIS — Z823 Family history of stroke: Secondary | ICD-10-CM | POA: Diagnosis not present

## 2015-10-16 DIAGNOSIS — Z9181 History of falling: Secondary | ICD-10-CM | POA: Diagnosis not present

## 2015-10-16 DIAGNOSIS — I10 Essential (primary) hypertension: Secondary | ICD-10-CM | POA: Diagnosis present

## 2015-10-16 DIAGNOSIS — J189 Pneumonia, unspecified organism: Secondary | ICD-10-CM | POA: Diagnosis present

## 2015-10-16 DIAGNOSIS — M545 Low back pain: Secondary | ICD-10-CM | POA: Diagnosis present

## 2015-10-16 DIAGNOSIS — J449 Chronic obstructive pulmonary disease, unspecified: Secondary | ICD-10-CM | POA: Diagnosis present

## 2015-10-16 DIAGNOSIS — I1310 Hypertensive heart and chronic kidney disease without heart failure, with stage 1 through stage 4 chronic kidney disease, or unspecified chronic kidney disease: Secondary | ICD-10-CM | POA: Diagnosis present

## 2015-10-16 DIAGNOSIS — E876 Hypokalemia: Secondary | ICD-10-CM | POA: Diagnosis present

## 2015-10-16 DIAGNOSIS — J690 Pneumonitis due to inhalation of food and vomit: Secondary | ICD-10-CM | POA: Diagnosis not present

## 2015-10-16 DIAGNOSIS — G9340 Encephalopathy, unspecified: Secondary | ICD-10-CM | POA: Diagnosis not present

## 2015-10-16 DIAGNOSIS — E039 Hypothyroidism, unspecified: Secondary | ICD-10-CM | POA: Diagnosis present

## 2015-10-16 DIAGNOSIS — E785 Hyperlipidemia, unspecified: Secondary | ICD-10-CM

## 2015-10-16 DIAGNOSIS — Z9842 Cataract extraction status, left eye: Secondary | ICD-10-CM

## 2015-10-16 DIAGNOSIS — R0902 Hypoxemia: Secondary | ICD-10-CM | POA: Diagnosis not present

## 2015-10-16 DIAGNOSIS — I272 Other secondary pulmonary hypertension: Secondary | ICD-10-CM | POA: Diagnosis present

## 2015-10-16 DIAGNOSIS — Z9010 Acquired absence of unspecified breast and nipple: Secondary | ICD-10-CM | POA: Diagnosis not present

## 2015-10-16 DIAGNOSIS — Z961 Presence of intraocular lens: Secondary | ICD-10-CM | POA: Diagnosis present

## 2015-10-16 DIAGNOSIS — D696 Thrombocytopenia, unspecified: Secondary | ICD-10-CM | POA: Diagnosis present

## 2015-10-16 DIAGNOSIS — I480 Paroxysmal atrial fibrillation: Secondary | ICD-10-CM | POA: Diagnosis present

## 2015-10-16 DIAGNOSIS — I5032 Chronic diastolic (congestive) heart failure: Secondary | ICD-10-CM

## 2015-10-16 DIAGNOSIS — Z515 Encounter for palliative care: Secondary | ICD-10-CM | POA: Diagnosis present

## 2015-10-16 DIAGNOSIS — I5033 Acute on chronic diastolic (congestive) heart failure: Secondary | ICD-10-CM | POA: Diagnosis present

## 2015-10-16 DIAGNOSIS — K7290 Hepatic failure, unspecified without coma: Secondary | ICD-10-CM | POA: Diagnosis present

## 2015-10-16 DIAGNOSIS — R4182 Altered mental status, unspecified: Secondary | ICD-10-CM

## 2015-10-16 DIAGNOSIS — J9621 Acute and chronic respiratory failure with hypoxia: Secondary | ICD-10-CM | POA: Diagnosis present

## 2015-10-16 DIAGNOSIS — Z7982 Long term (current) use of aspirin: Secondary | ICD-10-CM | POA: Diagnosis not present

## 2015-10-16 DIAGNOSIS — K219 Gastro-esophageal reflux disease without esophagitis: Secondary | ICD-10-CM | POA: Diagnosis present

## 2015-10-16 DIAGNOSIS — R531 Weakness: Secondary | ICD-10-CM | POA: Diagnosis present

## 2015-10-16 DIAGNOSIS — I482 Chronic atrial fibrillation, unspecified: Secondary | ICD-10-CM

## 2015-10-16 DIAGNOSIS — Z8673 Personal history of transient ischemic attack (TIA), and cerebral infarction without residual deficits: Secondary | ICD-10-CM

## 2015-10-16 DIAGNOSIS — Z951 Presence of aortocoronary bypass graft: Secondary | ICD-10-CM

## 2015-10-16 DIAGNOSIS — Z87891 Personal history of nicotine dependence: Secondary | ICD-10-CM | POA: Diagnosis not present

## 2015-10-16 DIAGNOSIS — N179 Acute kidney failure, unspecified: Secondary | ICD-10-CM | POA: Diagnosis not present

## 2015-10-16 DIAGNOSIS — J9601 Acute respiratory failure with hypoxia: Secondary | ICD-10-CM | POA: Diagnosis not present

## 2015-10-16 DIAGNOSIS — I481 Persistent atrial fibrillation: Secondary | ICD-10-CM | POA: Diagnosis present

## 2015-10-16 DIAGNOSIS — Z853 Personal history of malignant neoplasm of breast: Secondary | ICD-10-CM

## 2015-10-16 DIAGNOSIS — J9622 Acute and chronic respiratory failure with hypercapnia: Secondary | ICD-10-CM | POA: Diagnosis present

## 2015-10-16 DIAGNOSIS — R278 Other lack of coordination: Secondary | ICD-10-CM | POA: Insufficient documentation

## 2015-10-16 DIAGNOSIS — I2510 Atherosclerotic heart disease of native coronary artery without angina pectoris: Secondary | ICD-10-CM | POA: Diagnosis present

## 2015-10-16 DIAGNOSIS — E662 Morbid (severe) obesity with alveolar hypoventilation: Secondary | ICD-10-CM | POA: Diagnosis present

## 2015-10-16 DIAGNOSIS — I2781 Cor pulmonale (chronic): Secondary | ICD-10-CM | POA: Diagnosis present

## 2015-10-16 DIAGNOSIS — Z66 Do not resuscitate: Secondary | ICD-10-CM | POA: Diagnosis present

## 2015-10-16 DIAGNOSIS — E8770 Fluid overload, unspecified: Secondary | ICD-10-CM | POA: Diagnosis not present

## 2015-10-16 DIAGNOSIS — R279 Unspecified lack of coordination: Secondary | ICD-10-CM

## 2015-10-16 DIAGNOSIS — D539 Nutritional anemia, unspecified: Secondary | ICD-10-CM | POA: Diagnosis present

## 2015-10-16 DIAGNOSIS — I4891 Unspecified atrial fibrillation: Secondary | ICD-10-CM | POA: Diagnosis present

## 2015-10-16 DIAGNOSIS — J90 Pleural effusion, not elsewhere classified: Secondary | ICD-10-CM | POA: Diagnosis present

## 2015-10-16 DIAGNOSIS — Z794 Long term (current) use of insulin: Secondary | ICD-10-CM | POA: Diagnosis not present

## 2015-10-16 DIAGNOSIS — E7220 Disorder of urea cycle metabolism, unspecified: Secondary | ICD-10-CM

## 2015-10-16 DIAGNOSIS — I110 Hypertensive heart disease with heart failure: Secondary | ICD-10-CM

## 2015-10-16 DIAGNOSIS — E1122 Type 2 diabetes mellitus with diabetic chronic kidney disease: Secondary | ICD-10-CM | POA: Diagnosis present

## 2015-10-16 LAB — URINALYSIS, ROUTINE W REFLEX MICROSCOPIC
Bilirubin Urine: NEGATIVE
GLUCOSE, UA: NEGATIVE mg/dL
HGB URINE DIPSTICK: NEGATIVE
KETONES UR: NEGATIVE mg/dL
LEUKOCYTES UA: NEGATIVE
Nitrite: NEGATIVE
PROTEIN: NEGATIVE mg/dL
Specific Gravity, Urine: 1.01 (ref 1.005–1.030)
pH: 5 (ref 5.0–8.0)

## 2015-10-16 LAB — I-STAT ARTERIAL BLOOD GAS, ED
Acid-Base Excess: 16 mmol/L — ABNORMAL HIGH (ref 0.0–2.0)
Bicarbonate: 43.7 mEq/L — ABNORMAL HIGH (ref 20.0–24.0)
O2 SAT: 93 %
PCO2 ART: 65.6 mmHg — AB (ref 35.0–45.0)
PO2 ART: 67 mmHg — AB (ref 80.0–100.0)
Patient temperature: 98.6
TCO2: 46 mmol/L (ref 0–100)
pH, Arterial: 7.432 (ref 7.350–7.450)

## 2015-10-16 LAB — CBC WITH DIFFERENTIAL/PLATELET
Basophils Absolute: 0 10*3/uL (ref 0.0–0.1)
Basophils Relative: 0 %
EOS ABS: 0.3 10*3/uL (ref 0.0–0.7)
EOS PCT: 6 %
HCT: 32.5 % — ABNORMAL LOW (ref 39.0–52.0)
Hemoglobin: 9.5 g/dL — ABNORMAL LOW (ref 13.0–17.0)
LYMPHS ABS: 0.8 10*3/uL (ref 0.7–4.0)
LYMPHS PCT: 15 %
MCH: 29.8 pg (ref 26.0–34.0)
MCHC: 29.2 g/dL — AB (ref 30.0–36.0)
MCV: 101.9 fL — AB (ref 78.0–100.0)
MONO ABS: 0.7 10*3/uL (ref 0.1–1.0)
MONOS PCT: 12 %
Neutro Abs: 3.6 10*3/uL (ref 1.7–7.7)
Neutrophils Relative %: 67 %
PLATELETS: 125 10*3/uL — AB (ref 150–400)
RBC: 3.19 MIL/uL — AB (ref 4.22–5.81)
RDW: 17.2 % — ABNORMAL HIGH (ref 11.5–15.5)
WBC: 5.4 10*3/uL (ref 4.0–10.5)

## 2015-10-16 LAB — RAPID URINE DRUG SCREEN, HOSP PERFORMED
Amphetamines: NOT DETECTED
BARBITURATES: NOT DETECTED
Benzodiazepines: NOT DETECTED
Cocaine: NOT DETECTED
Opiates: NOT DETECTED
Tetrahydrocannabinol: NOT DETECTED

## 2015-10-16 LAB — COMPREHENSIVE METABOLIC PANEL
ALT: 8 U/L — AB (ref 17–63)
ANION GAP: 7 (ref 5–15)
AST: 16 U/L (ref 15–41)
Albumin: 2.8 g/dL — ABNORMAL LOW (ref 3.5–5.0)
Alkaline Phosphatase: 56 U/L (ref 38–126)
BUN: 54 mg/dL — ABNORMAL HIGH (ref 6–20)
CALCIUM: 9 mg/dL (ref 8.9–10.3)
CHLORIDE: 90 mmol/L — AB (ref 101–111)
CO2: 42 mmol/L — ABNORMAL HIGH (ref 22–32)
CREATININE: 1.55 mg/dL — AB (ref 0.61–1.24)
GFR, EST AFRICAN AMERICAN: 50 mL/min — AB (ref >60)
GFR, EST NON AFRICAN AMERICAN: 43 mL/min — AB (ref >60)
Glucose, Bld: 143 mg/dL — ABNORMAL HIGH (ref 65–99)
Potassium: 3.5 mmol/L (ref 3.5–5.1)
SODIUM: 139 mmol/L (ref 135–145)
Total Bilirubin: 0.5 mg/dL (ref 0.3–1.2)
Total Protein: 6.6 g/dL (ref 6.5–8.1)

## 2015-10-16 LAB — AMMONIA: AMMONIA: 45 umol/L — AB (ref 9–35)

## 2015-10-16 LAB — I-STAT CG4 LACTIC ACID, ED
LACTIC ACID, VENOUS: 1.48 mmol/L (ref 0.5–2.0)
LACTIC ACID, VENOUS: 0.65 mmol/L (ref 0.5–2.0)

## 2015-10-16 LAB — ETHANOL

## 2015-10-16 LAB — BRAIN NATRIURETIC PEPTIDE: B Natriuretic Peptide: 851.3 pg/mL — ABNORMAL HIGH (ref 0.0–100.0)

## 2015-10-16 LAB — GLUCOSE, CAPILLARY: GLUCOSE-CAPILLARY: 125 mg/dL — AB (ref 65–99)

## 2015-10-16 MED ORDER — SODIUM CHLORIDE 0.9 % IJ SOLN
3.0000 mL | Freq: Two times a day (BID) | INTRAMUSCULAR | Status: DC
  Administered 2015-10-16 – 2015-10-23 (×12): 3 mL via INTRAVENOUS

## 2015-10-16 MED ORDER — ALBUTEROL SULFATE (2.5 MG/3ML) 0.083% IN NEBU: 2.5000 mg | INHALATION_SOLUTION | RESPIRATORY_TRACT | Status: DC | PRN

## 2015-10-16 MED ORDER — FUROSEMIDE 10 MG/ML IJ SOLN
40.0000 mg | Freq: Three times a day (TID) | INTRAMUSCULAR | Status: DC
  Administered 2015-10-16 – 2015-10-18 (×5): 40 mg via INTRAVENOUS
  Filled 2015-10-16 (×5): qty 4

## 2015-10-16 MED ORDER — PIPERACILLIN-TAZOBACTAM 3.375 G IVPB
3.3750 g | Freq: Three times a day (TID) | INTRAVENOUS | Status: DC
  Administered 2015-10-16 – 2015-10-19 (×8): 3.375 g via INTRAVENOUS
  Filled 2015-10-16 (×11): qty 50

## 2015-10-16 MED ORDER — ONDANSETRON HCL 4 MG/2ML IJ SOLN: 4.0000 mg | Freq: Four times a day (QID) | INTRAMUSCULAR | Status: DC | PRN

## 2015-10-16 MED ORDER — INSULIN GLARGINE 100 UNIT/ML ~~LOC~~ SOLN
6.0000 [IU] | Freq: Every day | SUBCUTANEOUS | Status: DC
  Administered 2015-10-16 – 2015-10-22 (×7): 6 [IU] via SUBCUTANEOUS
  Filled 2015-10-16 (×10): qty 0.06

## 2015-10-16 MED ORDER — FOLIC ACID 1 MG PO TABS
1.0000 mg | ORAL_TABLET | Freq: Every day | ORAL | Status: DC
  Administered 2015-10-17 – 2015-10-22 (×5): 1 mg via ORAL
  Filled 2015-10-16 (×5): qty 1

## 2015-10-16 MED ORDER — CARVEDILOL 6.25 MG PO TABS
6.2500 mg | ORAL_TABLET | Freq: Two times a day (BID) | ORAL | Status: DC
  Administered 2015-10-17: 6.25 mg via ORAL
  Filled 2015-10-16: qty 1

## 2015-10-16 MED ORDER — SODIUM CHLORIDE 0.9 % IJ SOLN: 3.0000 mL | INTRAMUSCULAR | Status: DC | PRN

## 2015-10-16 MED ORDER — FUROSEMIDE 10 MG/ML IJ SOLN
80.0000 mg | Freq: Once | INTRAMUSCULAR | Status: AC
  Administered 2015-10-16: 80 mg via INTRAVENOUS
  Filled 2015-10-16: qty 8

## 2015-10-16 MED ORDER — LACTULOSE 10 GM/15ML PO SOLN
20.0000 g | Freq: Two times a day (BID) | ORAL | Status: DC
  Administered 2015-10-16 – 2015-10-18 (×4): 20 g via ORAL
  Filled 2015-10-16 (×4): qty 30

## 2015-10-16 MED ORDER — CETYLPYRIDINIUM CHLORIDE 0.05 % MT LIQD
7.0000 mL | Freq: Two times a day (BID) | OROMUCOSAL | Status: DC
  Administered 2015-10-16 – 2015-10-23 (×14): 7 mL via OROMUCOSAL

## 2015-10-16 MED ORDER — ASPIRIN EC 325 MG PO TBEC
325.0000 mg | DELAYED_RELEASE_TABLET | Freq: Every day | ORAL | Status: DC
  Administered 2015-10-17 – 2015-10-22 (×5): 325 mg via ORAL
  Filled 2015-10-16 (×6): qty 1

## 2015-10-16 MED ORDER — OXYCODONE HCL 5 MG PO TABS
10.0000 mg | ORAL_TABLET | Freq: Three times a day (TID) | ORAL | Status: DC | PRN
  Administered 2015-10-16 – 2015-10-22 (×10): 10 mg via ORAL
  Filled 2015-10-16 (×10): qty 2

## 2015-10-16 MED ORDER — PANTOPRAZOLE SODIUM 40 MG PO TBEC
40.0000 mg | DELAYED_RELEASE_TABLET | Freq: Every day | ORAL | Status: DC
  Administered 2015-10-17 – 2015-10-23 (×6): 40 mg via ORAL
  Filled 2015-10-16 (×6): qty 1

## 2015-10-16 MED ORDER — SODIUM CHLORIDE 0.9 % IV SOLN: 250.0000 mL | INTRAVENOUS | Status: DC | PRN

## 2015-10-16 MED ORDER — TAMOXIFEN CITRATE 10 MG PO TABS
20.0000 mg | ORAL_TABLET | Freq: Every day | ORAL | Status: DC
  Administered 2015-10-18 – 2015-10-22 (×4): 20 mg via ORAL
  Filled 2015-10-16 (×6): qty 2

## 2015-10-16 MED ORDER — FUROSEMIDE 10 MG/ML IJ SOLN: 40.0000 mg | Freq: Two times a day (BID) | INTRAMUSCULAR | Status: DC

## 2015-10-16 MED ORDER — ISOSORBIDE DINITRATE 10 MG PO TABS
10.0000 mg | ORAL_TABLET | Freq: Three times a day (TID) | ORAL | Status: DC
  Administered 2015-10-16: 10 mg via ORAL
  Filled 2015-10-16: qty 1

## 2015-10-16 MED ORDER — VENLAFAXINE HCL ER 75 MG PO CP24
225.0000 mg | ORAL_CAPSULE | Freq: Every day | ORAL | Status: DC
  Administered 2015-10-17 – 2015-10-22 (×6): 225 mg via ORAL
  Filled 2015-10-16 (×9): qty 1

## 2015-10-16 MED ORDER — GABAPENTIN 300 MG PO CAPS
300.0000 mg | ORAL_CAPSULE | Freq: Two times a day (BID) | ORAL | Status: DC
  Administered 2015-10-16 – 2015-10-23 (×13): 300 mg via ORAL
  Filled 2015-10-16 (×13): qty 1

## 2015-10-16 MED ORDER — LEVOTHYROXINE SODIUM 75 MCG PO TABS
187.0000 ug | ORAL_TABLET | Freq: Every day | ORAL | Status: DC
  Administered 2015-10-17 – 2015-10-23 (×7): 187 ug via ORAL
  Filled 2015-10-16 (×2): qty 1
  Filled 2015-10-16: qty 3
  Filled 2015-10-16 (×4): qty 1

## 2015-10-16 MED ORDER — ACETAMINOPHEN 325 MG PO TABS
650.0000 mg | ORAL_TABLET | ORAL | Status: DC | PRN
  Administered 2015-10-17: 650 mg via ORAL
  Filled 2015-10-16: qty 2

## 2015-10-16 MED ORDER — ATORVASTATIN CALCIUM 20 MG PO TABS
20.0000 mg | ORAL_TABLET | Freq: Every day | ORAL | Status: DC
Start: 2015-10-16 — End: 2015-10-22
  Administered 2015-10-16 – 2015-10-21 (×5): 20 mg via ORAL
  Filled 2015-10-16 (×6): qty 1

## 2015-10-16 MED ORDER — INSULIN ASPART 100 UNIT/ML ~~LOC~~ SOLN: 0.0000 [IU] | Freq: Every day | SUBCUTANEOUS | Status: DC

## 2015-10-16 MED ORDER — VANCOMYCIN HCL 10 G IV SOLR
2000.0000 mg | Freq: Once | INTRAVENOUS | Status: AC
  Administered 2015-10-17: 2000 mg via INTRAVENOUS
  Filled 2015-10-16: qty 2000

## 2015-10-16 MED ORDER — VANCOMYCIN HCL 10 G IV SOLR
1250.0000 mg | INTRAVENOUS | Status: DC
  Administered 2015-10-17: 1250 mg via INTRAVENOUS
  Filled 2015-10-16 (×2): qty 1250

## 2015-10-16 MED ORDER — INSULIN ASPART 100 UNIT/ML ~~LOC~~ SOLN
0.0000 [IU] | Freq: Three times a day (TID) | SUBCUTANEOUS | Status: DC
  Administered 2015-10-17: 3 [IU] via SUBCUTANEOUS
  Administered 2015-10-17 – 2015-10-22 (×6): 1 [IU] via SUBCUTANEOUS

## 2015-10-16 MED ORDER — INSULIN GLARGINE 100 UNIT/ML SOLOSTAR PEN: 6.0000 [IU] | PEN_INJECTOR | Freq: Every day | SUBCUTANEOUS | Status: DC

## 2015-10-16 MED ORDER — ENOXAPARIN SODIUM 30 MG/0.3ML ~~LOC~~ SOLN
30.0000 mg | SUBCUTANEOUS | Status: DC
Start: 2015-10-16 — End: 2015-10-17
  Administered 2015-10-16: 30 mg via SUBCUTANEOUS
  Filled 2015-10-16: qty 0.3

## 2015-10-16 MED ORDER — IPRATROPIUM-ALBUTEROL 0.5-2.5 (3) MG/3ML IN SOLN
3.0000 mL | Freq: Two times a day (BID) | RESPIRATORY_TRACT | Status: DC
  Administered 2015-10-16 – 2015-10-23 (×13): 3 mL via RESPIRATORY_TRACT
  Filled 2015-10-16 (×13): qty 3

## 2015-10-16 NOTE — ED Notes (Signed)
Wheelchair that patient arrived to ED with from the heart failure clinic sent with the patient to the floor due to patient unable to tell me if it was his wheelchair or not.

## 2015-10-16 NOTE — Progress Notes (Addendum)
Patient ID: [name], male   DOB: 1946/09/23, 70 y.o.   MRN: [id number]   Advanced Heart Failure Clinic Note   Date:  10/16/2015   ID:  [name], DOB [birth year], MRN [id number]  PCP:  [name] Every, MD  Cardiologist:  Dr. Lauree [name]   CHF:  Dr. Haroldine [name]   HPI: [name] is a 70 y.o. 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 Patient is not a Coumadin candidate secondary to history of alcohol abuse. He was admitted 04/2014 with acute on chronic diastolic CHF and developed cardiorenal syndrome requiring temporary hemodialysis.  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  Myoview 05/2014 was neg for ischemia.  He has a hx of breast CA and underwent mastectomy.   He has had 5 admission in the past 6 months as of 09/03/15.  In mid August, he was admitted with generalized weakness in the setting of metabolic derangement with severe hypokalemia and hypomagnesemia related to diuretic use as well as alcohol intake.   Admitted 8/22-8/29 for exploratory laparotomy with small bowel resection and incisional hernia repair with Dr. Ninfa [name]. He was discharged to inpatient rehabilitation. He developed volume excess while in inpatient rehabilitation requiring IV Lasix. Echocardiogram demonstrated normal LV function, mild aortic stenosis and moderate biatrial enlargement.   Admitted 9/20-9/26 with sepsis in the setting of lower extremity cellulitis. With fluid resuscitation, he developed acute on chronic diastolic CHF with pulmonary edema.   Admitted 08/11/15 with acute respiratory failure. He was treated for PNA with Zosyn.  Also had RUQ, CT 08/17/15 with no findings. Also noted to be volume overloaded. He initially had slow diuresis and HF team was consulted.  Had improved diuresis on IV lasix up to 80 mg IV BID  with acetazolamide 500 mg BID.  Discharge weight was 224 lb, and we increased lasix to 80 mg BID on d/c.   He returns for follow up with his daughter.  Last visit he had 17 pound weight loss after being switched to torsemide however his weight has been trending up. . At SNF torsemide was increased to 80 mg twice a day on 09/26/15. Metolazone was also given 1/2, 1/3, and 10/15/2015.Last recorded weight at SNF 265 pounds on 10/13/2014.  Complaining of fatigue and difficulty staying awake. SOB with exertion. On 3 liters [place]. Hypoxic on arrival to clinic with oxygen saturations 79%. Complaining tremors. His daughter says he is having difficulty staying awake. All  meds provided at [facility]. Currently at [facility].    Studies: - LHC (11/01): 3 vessel CAD >>> CABG - Nuclear (9/14): Inferolateral, no ischemia, not gated, probable low risk scan >>> med rx - Carotid US (5/15): Bilateral ICA 60-79%, >50% RECA- f/u 47-months - Echo (7/15): EF 50-55%, MAC, moderate to severe LAE, moderate RVH, moderate RAE - Nuclear (8/15):  Normal, EF 63% - Carotid US (6/16):  Bilateral ICA 40-59%; > 50% bilat ECA, L VA occluded, FU 1 year - Echo (06/12/15):  EF 60-65%, no RWMA, mild AS (mean 12 mmHg), MAC, mod LAE, mod RAE - Venous Duplex (07/03/15):  Neg for DVT bilaterally  - Echo 08/16/15 : EF 55-60%, Mild AS, RV moderately dilated, Mild/moderate reduction in RV function. Severe RAE. PA peak pressure 82  Allergies:   Erythromycin   Social History:  The patient  reports that  he quit smoking about 46 years ago. He has never used smokeless tobacco. He reports that he drinks alcohol. He reports that he uses illicit drugs (Marijuana).   Family History:  The patient's family history includes CVA in his father and mother; Cancer in his mother; Stomach cancer (age of onset: 69) in his brother. There is no history of Heart attack.    Past Medical History  Diagnosis Date  . CHRONIC OBSTRUCTIVE PULMONARY DISEASE 06/20/2009  .  CAROTID STENOSIS 06/20/2009    A. 08/2001 s/p L CEA;  B.   09/14/11 - Carotid U/S - 40-59% bilateral stenosis, left CEA patch angioplasty is patent  . DM 06/20/2009  . CAD 06/20/2009    A.  08/2000 - s/p CABG x 4 - LIMA-LAD, Left Radial-OM, VG-DIAG, VG-RCA;  B. Neg. MV  2010  . HYPERLIPIDEMIA 06/20/2009  . HYPERTENSION 06/20/2009  . Hypothyroidism   . Low back pain   . Asthma     as child  . Pneumonia   . Persistent atrial fibrillation (HCC)     Not felt to be coumadin candidate 2/2 ETOH use.  Marland Kitchen History of tobacco abuse     remote - quit 1970  . Morbidly obese ([place])   . Chronic diastolic heart failure, NYHA class 3 ([place])     Followed by CHF Clinic --> Echo 04/2014: EF 99991111, Diastolic DysFxn - elevated LVEDP & LAP. Mod Dil LA & RA.  . Falls frequently   . Hx of cardiovascular stress test 05/2014    Lexiscan Myoview (8/15):  No ischemia; EF 63% - Normal Study  . Breast cancer, right breast ([place])   . Gout   . OBSTRUCTIVE SLEEP APNEA 06/20/2009    does not use cpap  . Stroke [facility])     found on MRI in Oct. 2015.   Marland Kitchen Shortness of breath dyspnea     occasional  . Depression     takes Prozac  . GERD (gastroesophageal reflux disease)     takes Prilosec  . Arthritis   . Neuromuscular disorder (HCC)     neuropathy in both legs  . Constipation   . ETOH abuse     no alcohol since Oct. 2015  . 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 CHF (congestive heart failure) ([place])   . Supplemental oxygen dependent     hs  . Bilateral renal cysts   . Kidney disease     Stage 3  . Pulmonary HTN ([place])     a. Arp 11/16: severe pulmonary HTN, PASP 57, PA sat 42%, Ao sat 90%, CO 4.2 L/min, CI 1.88, PCWP mean 24, RA mean 14    Current Outpatient Prescriptions  Medication Sig Dispense Refill  . albuterol (PROVENTIL) (2.5 MG/3ML) 0.083% nebulizer solution Take 3 mLs (2.5 mg total) by nebulization [name] 2 (two) hours as needed for wheezing or shortness of  breath.    . allopurinol (ZYLOPRIM) 300 MG tablet Take 300 mg by mouth daily.    Marland Kitchen aspirin EC 325 MG EC tablet Take 1 tablet (325 mg total) by mouth daily. 30 tablet 0  . atorvastatin (LIPITOR) 20 MG tablet Take 1 tablet (20 mg total) by mouth daily. 30 tablet 1  . carvedilol (COREG) 3.125 MG tablet Take 2 tablets (6.25 mg total) by mouth 2 (two) times daily with a meal. 60 tablet 1  . cetirizine (ZYRTEC) 10 MG tablet Take 10 mg by mouth  daily.    . colchicine 0.6 MG tablet Take 0.6 mg by mouth daily.    . fluticasone (FLOVENT HFA) 110 MCG/ACT inhaler Inhale 1 puff into the lungs 2 (two) times daily.     . folic acid (FOLVITE) 1 MG tablet Take 1 tablet (1 mg total) by mouth daily. 30 tablet 1  . gabapentin (NEURONTIN) 300 MG capsule Take 1 capsule (300 mg total) by mouth 2 (two) times daily. 60 capsule 1  . hydrALAZINE (APRESOLINE) 100 MG tablet Take 1 tablet (100 mg total) by mouth [name] 8 (eight) hours.    . insulin aspart (NOVOLOG) 100 UNIT/ML injection 0-9 Units, Subcutaneous, 3 times daily with meals, First dose on Tue 08/12/15 at 0800 Correction coverage: Sensitive (thin, NPO, renal) CBG < 70: implement hypoglycemia protocol CBG 70 - 120: 0 units CBG 121 - 150: 1 unit CBG 151 - 200: 2 units CBG 201 - 250: 3 units CBG 251 - 300: 5 units CBG 301 - 350: 7 units CBG 351 - 400: 9 units CBG > 400: call MD    . Insulin Glargine (LANTUS SOLOSTAR) 100 UNIT/ML Solostar Pen Inject 6 Units into the skin daily at 10 pm.    . ipratropium-albuterol (DUONEB) 0.5-2.5 (3) MG/3ML SOLN Take 3 mLs by nebulization 2 (two) times daily.    . isosorbide dinitrate (ISORDIL) 10 MG tablet Take 1 tablet (10 mg total) by mouth 3 (three) times daily.    Marland Kitchen levothyroxine (SYNTHROID, LEVOTHROID) 125 MCG tablet Take 1.5 tablets (187 mcg total) by mouth daily before breakfast. 30 tablet 3  . magnesium oxide (MAG-OX) 400 (241.3 MG) MG tablet Take 1 tablet (400 mg total) by mouth 2 (two) times daily. 60 tablet 11  .  omeprazole (PRILOSEC) 20 MG capsule Take 1 capsule (20 mg total) by mouth daily. 30 capsule 1  . Oxycodone HCl 10 MG TABS Take 1 tablet (10 mg total) by mouth 3 (three) times daily as needed (pain). 20 tablet 0  . potassium chloride SA (KLOR-CON M20) 20 MEQ tablet Take 1 tablet (20 mEq total) by mouth 2 (two) times daily.    . sildenafil (REVATIO) 20 MG tablet Take 1 tablet (20 mg total) by mouth 3 (three) times daily.  0  . tamoxifen (NOLVADEX) 20 MG tablet Take 1 tablet (20 mg total) by mouth daily. 90 tablet 3  . torsemide (DEMADEX) 20 MG tablet Take 80 mg by mouth 2 (two) times daily.    Marland Kitchen venlafaxine XR (EFFEXOR-XR) 75 MG 24 hr capsule Take 225 mg by mouth daily with breakfast.      No current facility-administered medications for this encounter.    Allergies  Allergen Reactions  . Erythromycin Hives    Oramycin      Social History   Social History  . Marital Status: Single    Spouse Name: N/A  . Number of Children: N/A  . Years of Education: N/A   Occupational History  . retired    Social History Main Topics  . Smoking status: Former Smoker -- 1.00 packs/day for 16 years    Quit date: 10/11/1968  . Smokeless tobacco: Never Used  . Alcohol Use: Yes     Comment: last drink was 2 weeks - 8?16  . Drug Use: Yes    Special: Marijuana     Comment: last 1 year ago  . Sexual Activity: No   Other Topics Concern  . Not on file   Social History Narrative   Lives in [place] with dtr, son-in-law      Family History  Problem Relation Age of Onset  . CVA Mother     ?  Marland Kitchen Cancer Mother     unknown type of cancer  . CVA Father     ?  Marland Kitchen Heart attack Neg Hx   . Stomach cancer Brother 19    stomach cancer    Filed Vitals:   10/16/15 1224  BP: 112/58  Pulse: 74  Weight: 256 lb 6.4 oz (116.302 kg)  SpO2: 98%    PHYSICAL EXAM: General: Elderly and chronically ill appearing.  Arrive in wheel chair wit his daughter.    HEENT: Normal  Neck: supple. JVP difficult  to see but does not appear elevated. Carotids 2+ bilat; no bruits. No thyromegaly noted. Cor: PMI nondisplaced. Irregular. No murmur, gallop, or rub noted.  Lungs: RML RLL LLL decreased with crackles noted. On 6 liters oxygen.   Abdomen: Obese, soft, nontender, nondistended, no HSM. No bruits or masses. +BS Extremities: no cyanosis, clubbing, rash. R and LLE with compression wraps. R and L hand asterixis noted. .  Neuro: Drowsy but arousable. Orientedx3, cranial nerves grossly intact. moves all 4 extremities w/o difficulty. Affect pleasant  ASSESSMENT & PLAN:  1. Chronic Diastolic CHF:  - NYHA IIIB. Volume status diffiuclt to assess but does not appear overly elevated despite weight gain.  Continue torsemide 60 mg twice a day.  Continue current coreg, hydralazine. Stop isordil as he is on sildenafil. .   Check BMET today.  Ask SNF to place to  compression wraps wit unna boots. Change a couple of times a week. If he is discharged he will need HH + diuretic protocol from [facility].  2. PAH- Transpulmonary gradient of 33 and PVR 8.25 last check. Peak PA pressure 82 echo 08/2015 in setting of volume overload. - Continue sildenafil 20 TID. Not candidate for repeat RHC at this point.  2. CAD s/p CABG:  No angina. Continue aspirin, statin, beta blocker. 3. Atrial Fibrillation:  Chronic. Rate controlled. Poor candidate for anticoag with hx alcohol abuse and noncompliance 4. Carotid Stenosis:  Stable bilateral on [place] 03/2015. F/u 03/2016. 6. HTN: Stable today.   7. Chronic Kidney Disease:  8. Hyperlipidemia:  Continue statin. 9. Breast cancer: Status post mastectomy. He has been treated with tamoxifen. He is followed by oncology. 10. S/P SB resection: Stable 11. Hypoxia- Oxygen saturations on arrival 79% on 3 liters [place] but improved to 94% on 6 liters.  12. L Pleural Effusion- Per MD note at SNF on 10/14/2015 CXR with effusion. Will need CXR. .  13. H/O Cirrhosis- Suspected Hepatic Encephalopathy- + Asterixis.  Will need to check ammonia  Sent to [facility] ED for further evaluation--> Hypoxia, ? Pleural Effusion, ? Hepatic encephalopathy     Dr [name] evaluated and agrees with plan.   Darrick Grinder, NP-C  12:52 PM  Patient seen and examined with Darrick Grinder, NP. We discussed all aspects of the encounter. I agree with the assessment and plan as stated above.   He has multiple issues including recurrent volume overload/diastolic HF, possible PNA vs effusion and extreme lethargy likely due in part to hepatic encephalopathy. Will refer to ER for evaluation including labs (CBC, CMET, ammonia, ABG) and CXR. Suspect he will need admission. D/w with ER triage nurse and EDP.   Bensimhon, Daniel,MD 10:34 PM

## 2015-10-16 NOTE — ED Notes (Signed)
Pt sent to triage from heart failure clinic and arrived to room with ER staff. Pt unable to report why he was sent here. States he feels weak but he is always weak. Pt denies chest pain. Pt does report shortness of breathe that is also normal for him. Pt unable to tell me anything else about why he was sent here by the heart failure clinic.

## 2015-10-16 NOTE — ED Provider Notes (Signed)
CSN: [id number]     Arrival date & time 10/16/15  1259 History   First MD Initiated Contact with Patient 10/16/15 1302     Chief Complaint  Patient presents with  . Weakness   (Consider location/radiation/quality/duration/timing/severity/associated sxs/prior Treatment) The history is provided by the patient. No language interpreter was used.    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 Patient is not a Coumadin candidate secondary to history of alcohol abuse. He was sent to the ED for further evaluation--> Hypoxia, ? Pleural Effusion, ? Hepatic encephalopathy.    Last visit had 17 pound weight loss. At SNF torsemide was increased to 80 mg twice a day on 09/26/15. Metolazone was also given 1/2. 1/3, and 10/15/2015.Last recorded weight at SNF 265 pounds. Complaining of fatigue and difficulty staying awake. SOB with exertion. On 3 liters [place]. Hypoxic on arrival to clinic with oxygen saturations 79%. Complaining tremors. Having difficulty staying awake. All meds provided at [facility]. Currently at [facility].     Past Medical History  Diagnosis Date  . CHRONIC OBSTRUCTIVE PULMONARY DISEASE 06/20/2009  . CAROTID STENOSIS 06/20/2009    A. 08/2001 s/p L CEA;  B.   09/14/11 - Carotid U/S - 40-59% bilateral stenosis, left CEA patch angioplasty is patent  . DM 06/20/2009  . CAD 06/20/2009    A.  08/2000 - s/p CABG x 4 - LIMA-LAD, Left Radial-OM, VG-DIAG, VG-RCA;  B. Neg. MV  2010  . HYPERLIPIDEMIA 06/20/2009  . HYPERTENSION 06/20/2009  . Hypothyroidism   . Low back pain   . Asthma     as child  . Pneumonia   . Persistent atrial fibrillation (HCC)     Not felt to be  coumadin candidate 2/2 ETOH use.  Marland Kitchen History of tobacco abuse     remote - quit 1970  . Morbidly obese ([place])   . Chronic diastolic heart failure, NYHA class 3 ([place])     Followed by CHF Clinic --> Echo 04/2014: EF [id number], Diastolic DysFxn - elevated LVEDP & LAP. Mod Dil LA & RA.  . Falls frequently   . Hx of cardiovascular stress test 05/2014    Lexiscan Myoview (8/15):  No ischemia; EF 63% - Normal Study  . Breast cancer, right breast ([place])   . Gout   . OBSTRUCTIVE SLEEP APNEA 06/20/2009    does not use cpap  . Stroke [facility])     found on MRI in Oct. 2015.   Marland Kitchen Shortness of breath dyspnea     occasional  . Depression     takes Prozac  . GERD (gastroesophageal reflux disease)     takes Prilosec  . Arthritis   . Neuromuscular disorder (HCC)     neuropathy in both legs  . Constipation   . ETOH abuse     no alcohol since Oct. 2015  . 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 CHF (congestive heart failure) ([place])   . Supplemental oxygen dependent     hs  . Bilateral renal cysts   . Kidney disease     Stage 3  . Pulmonary HTN ([place])  a. [place] 11/16: severe pulmonary HTN, PASP 57, PA sat 42%, Ao sat 90%, CO 4.2 L/min, CI 1.88, PCWP mean 24, RA mean 14   Past Surgical History  Procedure Laterality Date  . Carotid endarterectomy  2002    left  . Coronary artery bypass graft      x 4 - 2001  . Umbilical hernia repair N/A 01/28/2014    Procedure: HERNIA REPAIR UMBILICAL ADULT/INC;  Surgeon: Harl Bowie, MD;  Location: [place];  Service: General;  Laterality: N/A;  . Laparotomy N/A 01/28/2014    Procedure: EXPLORATORY LAPAROTOMY;  Surgeon: Harl Bowie, MD;  Location: [place];  Service: General;  Laterality: N/A;  . Bowel resection N/A 01/28/2014    Procedure: SMALL BOWEL RESECTION;  Surgeon: Harl Bowie, MD;  Location: [place];  Service: General;  Laterality: N/A;  . Hernia repair    . Colonoscopy    . Total mastectomy Right  11/11/2014    Procedure: RIGHT TOTAL MASTECTOMY;  Surgeon: Excell Seltzer, MD;  Location: [place];  Service: General;  Laterality: Right;  . Cataract extraction w/phaco Left 04/08/2015    Procedure: CATARACT EXTRACTION PHACO AND INTRAOCULAR LENS PLACEMENT ([place]);  Surgeon: Birder Robson, MD;  Location: [facility];  Service: Ophthalmology;  Laterality: Left;  [place] 00:36 AP% 23.7 CDE 8.72 fluid pack [id number]  . Eye surgery Left     Catarct  . Laparotomy N/A 06/02/2015    Procedure: EXPLORATORY LAPAROTOMY;  Surgeon: Coralie Keens, MD;  Location: [place];  Service: General;  Laterality: N/A;  . Bowel resection N/A 06/02/2015    Procedure: SMALL BOWEL RESECTION;  Surgeon: Coralie Keens, MD;  Location: [place];  Service: General;  Laterality: N/A;  . Cardiac catheterization N/A 08/15/2015    Procedure: Right Heart Cath;  Surgeon: Jettie Booze, MD;  Location: [place] [facility];  Service: Cardiovascular;  Laterality: N/A;   Family History  Problem Relation Age of Onset  . CVA Mother     ?  Marland Kitchen Cancer Mother     unknown type of cancer  . CVA Father     ?  Marland Kitchen Heart attack Neg Hx   . Stomach cancer Brother 72    stomach cancer   Social History  Substance Use Topics  . Smoking status: Former Smoker -- 1.00 packs/day for 16 years    Quit date: 10/11/1968  . Smokeless tobacco: Never Used  . Alcohol Use: Yes     Comment: last drink was 2 weeks - 8?16    Review of Systems  Constitutional: Negative for fever.  Cardiovascular: Negative for chest pain.  Gastrointestinal: Negative for abdominal pain.  All other systems reviewed and are negative.     Allergies  Erythromycin  Home Medications   Prior to Admission medications   Medication Sig Start Date End Date Taking? Authorizing Provider  albuterol (PROVENTIL) (2.5 MG/3ML) 0.083% nebulizer solution Take 3 mLs (2.5 mg total) by nebulization every 2 (two) hours as needed for wheezing or shortness of breath. 08/22/15  Yes Shanker Kristeen Mans, MD  allopurinol (ZYLOPRIM) 300 MG tablet Take 300 mg by mouth daily.   Yes Historical Provider, MD  aspirin EC 325 MG EC tablet Take 1 tablet (325 mg total) by mouth daily. 08/02/14  Yes Daniel J Angiulli, PA-C  atorvastatin (LIPITOR) 20 MG tablet Take 1 tablet (20 mg total) by mouth daily. 08/02/14  Yes Daniel J Angiulli, PA-C  carvedilol (COREG) 3.125 MG tablet Take 2 tablets (6.25 mg  total) by mouth 2 (two) times daily with a meal. 07/07/15  Yes Bonnielee Haff, MD  cetirizine (ZYRTEC) 10 MG tablet Take 10 mg by mouth daily.   Yes Historical Provider, MD  colchicine 0.6 MG tablet Take 0.6 mg by mouth daily. 01/13/15  Yes Historical Provider, MD  fluticasone (FLOVENT HFA) 110 MCG/ACT inhaler Inhale 1 puff into the lungs 2 (two) times daily.    Yes Historical Provider, MD  folic acid (FOLVITE) 1 MG tablet Take 1 tablet (1 mg total) by mouth daily. 08/02/14  Yes Daniel J Angiulli, PA-C  gabapentin (NEURONTIN) 300 MG capsule Take 1 capsule (300 mg total) by mouth 2 (two) times daily. 08/02/14  Yes Daniel J Angiulli, PA-C  guaiFENesin (MUCINEX) 600 MG 12 hr tablet Take 600 mg by mouth 2 (two) times daily.   Yes Historical Provider, MD  hydrALAZINE (APRESOLINE) 100 MG tablet Take 1 tablet (100 mg total) by mouth every 8 (eight) hours. 08/22/15  Yes Shanker Kristeen Mans, MD  insulin aspart (NOVOLOG) 100 UNIT/ML injection 0-9 Units, Subcutaneous, 3 times daily with meals, First dose on Tue 08/12/15 at 0800 Correction coverage: Sensitive (thin, NPO, renal) CBG < 70: implement hypoglycemia protocol CBG 70 - 120: 0 units CBG 121 - 150: 1 unit CBG 151 - 200: 2 units CBG 201 - 250: 3 units CBG 251 - 300: 5 units CBG 301 - 350: 7 units CBG 351 - 400: 9 units CBG > 400: call MD 08/22/15  Yes Shanker Kristeen Mans, MD  Insulin Glargine (LANTUS SOLOSTAR) 100 UNIT/ML Solostar Pen Inject 6 Units into the skin daily at 10 pm.   Yes Historical Provider, MD  ipratropium-albuterol (DUONEB) 0.5-2.5 (3) MG/3ML SOLN  Take 3 mLs by nebulization 2 (two) times daily. 08/22/15  Yes Shanker Kristeen Mans, MD  isosorbide dinitrate (ISORDIL) 10 MG tablet Take 1 tablet (10 mg total) by mouth 3 (three) times daily. 08/22/15  Yes Shanker Kristeen Mans, MD  levothyroxine (SYNTHROID, LEVOTHROID) 125 MCG tablet Take 1.5 tablets (187 mcg total) by mouth daily before breakfast. 08/02/14  Yes Daniel J Angiulli, PA-C  magnesium oxide (MAG-OX) 400 (241.3 MG) MG tablet Take 1 tablet (400 mg total) by mouth 2 (two) times daily. 06/26/15  Yes Scott Joylene Draft, PA-C  metolazone (ZAROXOLYN) 5 MG tablet Take 5 mg by mouth every evening.   Yes Historical Provider, MD  omeprazole (PRILOSEC) 20 MG capsule Take 1 capsule (20 mg total) by mouth daily. 08/02/14  Yes Daniel J Angiulli, PA-C  Oxycodone HCl 10 MG TABS Take 1 tablet (10 mg total) by mouth 3 (three) times daily as needed (pain). 08/22/15  Yes Shanker Kristeen Mans, MD  potassium chloride SA (KLOR-CON M20) 20 MEQ tablet Take 1 tablet (20 mEq total) by mouth 2 (two) times daily. 08/22/15  Yes Shanker Kristeen Mans, MD  saccharomyces boulardii (FLORASTOR) 250 MG capsule Take 250 mg by mouth 2 (two) times daily.   Yes Historical Provider, MD  sildenafil (REVATIO) 20 MG tablet Take 1 tablet (20 mg total) by mouth 3 (three) times daily. 08/22/15  Yes Shanker Kristeen Mans, MD  tamoxifen (NOLVADEX) 20 MG tablet Take 1 tablet (20 mg total) by mouth daily. 09/09/14  Yes Nicholas Lose, MD  torsemide (DEMADEX) 20 MG tablet Take 80 mg by mouth 2 (two) times daily.   Yes Historical Provider, MD  venlafaxine XR (EFFEXOR-XR) 75 MG 24 hr capsule Take 225 mg by mouth daily with breakfast.    Yes Historical Provider, MD   BP  131/68 mmHg  Pulse 84  Temp(Src) 97.9 F (36.6 C) (Oral)  Resp 19  SpO2 97% Physical Exam  Constitutional: He is oriented to person, place, and time. He appears well-developed and well-nourished.  HENT:  Head: Normocephalic and atraumatic.  Eyes: Conjunctivae are normal.  Neck: Normal range  of motion. Neck supple.  Cardiovascular: Normal rate and regular rhythm.   No murmur.  Pulmonary/Chest: Effort normal.  No respiratory distress or use of accessory muscles. 95% on 4 L of oxygen at bedside.  Abdominal: Soft. He exhibits no distension. There is no tenderness. There is no rebound.  Morbidly obese.  Musculoskeletal: Normal range of motion.  Asterixis of both hands.   Compression stockings in place on lower extremities.  Neurological: He is alert and oriented to person, place, and time. No sensory deficit.  Difficult to understand. A&O 3 but slow to answer questions.   Skin: Skin is warm and dry.  Ulcerations along the abdominal skin folds.   Psychiatric: He has a normal mood and affect.    ED Course  Procedures (including critical care time) Labs Review Labs Reviewed  CBC WITH DIFFERENTIAL/PLATELET - Abnormal; Notable for the following:    RBC 3.19 (*)    Hemoglobin 9.5 (*)    HCT 32.5 (*)    MCV 101.9 (*)    MCHC 29.2 (*)    RDW 17.2 (*)    Platelets 125 (*)    All other components within normal limits  COMPREHENSIVE METABOLIC PANEL - Abnormal; Notable for the following:    Chloride 90 (*)    CO2 42 (*)    Glucose, Bld 143 (*)    BUN 54 (*)    Creatinine, Ser 1.55 (*)    Albumin 2.8 (*)    ALT 8 (*)    GFR calc non Af Amer 43 (*)    GFR calc Af Amer 50 (*)    All other components within normal limits  AMMONIA - Abnormal; Notable for the following:    Ammonia 45 (*)    All other components within normal limits  I-STAT ARTERIAL BLOOD GAS, ED - Abnormal; Notable for the following:    pCO2 arterial 65.6 (*)    pO2, Arterial 67.0 (*)    Bicarbonate 43.7 (*)    Acid-Base Excess 16.0 (*)    All other components within normal limits  CULTURE, BLOOD (ROUTINE X 2)  CULTURE, BLOOD (ROUTINE X 2)  URINE CULTURE  URINALYSIS, ROUTINE W REFLEX MICROSCOPIC (NOT AT [facility])  ETHANOL  URINE RAPID DRUG SCREEN, HOSP PERFORMED  I-STAT CG4 LACTIC ACID, ED  I-STAT CG4  LACTIC ACID, ED    Imaging Review Dg Chest 2 View  10/16/2015  CLINICAL DATA:  Hypoxia for several days. Confusion. History of breast carcinoma EXAM: CHEST  2 VIEW COMPARISON:  August 15, 2015 FINDINGS: There is patchy airspace consolidation in both lung bases. There are small pleural effusions as well. The heart is mildly enlarged with pulmonary vascularity within normal limits. Patient is status post coronary artery bypass grafting. No adenopathy. There is degenerative change in the thoracic spine. IMPRESSION: Bibasilar airspace consolidation. Suspect pneumonia, although alveolar edema could present in this manner. There may be both congestive heart failure and pneumonia present. Note that there are small pleural effusions bilaterally with cardiomegaly. Electronically Signed   By: Lowella Grip III M.D.   On: 10/16/2015 14:34   I have personally reviewed and evaluated these images and lab results as part of my medical decision-making.  EKG Interpretation   Date/Time:  Thursday October 16 2015 13:12:12 EST Ventricular Rate:  76 PR Interval:    QRS Duration: 90 QT Interval:  315 QTC Calculation: 354 R Axis:   156 Text Interpretation:  Atrial fibrillation Probable RVH w/ secondary repol  abnormality Nonspecific T abnormalities, lateral leads Confirmed by  GOLDSTON  MD, SCOTT ([id number]) on 10/16/2015 2:20:08 PM      MDM   Final diagnoses:  Altered mental status, unspecified altered mental status type  Hypoxia  Pleural effusion  Patient presents for AMS and hypoxia from Dr. Mortimer Fries office. I spoke to Dr. Fredda Hammed regarding labs. I explained that his ammonia level was only 45. His other labs were not concerning at this time. His chest x-ray showed possible pneumonia versus congestive heart failure. He was afebrile on arrival. His oxygen was 95% oxygen on 4 L upon arrival. He recommended an ABG. He also recommended IV Lasix. He was started on 80 mg here in the ED.  I discussed this  patient with Dr. Regenia Skeeter was seen and evaluated the patient.  ABG- PCO2 is 65 and bicarbonate is 43. Patient is not on bipap at home.  I discussed this patient with Dr. Karleen Hampshire regarding need for IV lasix and AMS.  I do not believe the patient needs treatment for pneumonia at this time. Dr. Zoila Shutter is in agreement with plan.     Ottie Glazier, PA-C 10/16/15 1747  Sherwood Gambler, MD 10/17/15 [phone]

## 2015-10-16 NOTE — H&P (Signed)
[facility] History and Physical  [name] [id number] DOB: Sep 01, 1946 DOA: 10/16/2015  Referring physician: EDP PCP: Jene Every, MD   Chief Complaint: sent in from HF clinic for confusion and for evaluation of pneumonia.   HPI: [name] is a 70 y.o. male with a hx of CAD, status post CABG in 2001, atrial fibrillation, COPD, sleep apnea, CKD stage III, diastolic CHF, carotid stenosis s/p L CEA, incarcerated hernia with small bowel resection 4/15 c/b enterocutaneous fistula, alcohol abuse, not a candidate for coumadin secondary to non compliance and alcohol abuse, morbid obesity, chronic diastolic heart failure, was seen today at HF clinic for worsening lower extremity edema. During the exam he was found to be more confused that usual and had tremors in his upper extremities, hypoxic with oxygen sats in 79%, lethargic, and weight gain. He was sent to ED for further evaluation for possible hepatic encephalopathy and to rule out pneumonia. His CXR in the ED shows bibasilar airspace consolidation, suggesting pneumonia vs alveolar edema. He was found to be afebrile and non toxic appearing. His ABG revealed elevated pco2 and normal PH.  He was referred to medical service for admission.     Review of Systems:  Confused , not a great historian. No family at bedside, most of the history from EDP and chart.   Past Medical History  Diagnosis Date  . CHRONIC OBSTRUCTIVE PULMONARY DISEASE 06/20/2009  . CAROTID STENOSIS 06/20/2009    A. 08/2001 s/p L CEA;  B.   09/14/11 - Carotid U/S - 40-59% bilateral stenosis, left CEA patch angioplasty is patent  . DM 06/20/2009  . CAD 06/20/2009    A.  08/2000 - s/p CABG x 4 - LIMA-LAD, Left Radial-OM, VG-DIAG, VG-RCA;  B. Neg. MV  2010  . HYPERLIPIDEMIA 06/20/2009  . HYPERTENSION 06/20/2009  . Hypothyroidism   . Low back pain   . Asthma     as child  . Pneumonia   . Persistent atrial fibrillation (HCC)     Not felt to be coumadin candidate  2/2 ETOH use.  Marland Kitchen History of tobacco abuse     remote - quit 1970  . Morbidly obese ([place])   . Chronic diastolic heart failure, NYHA class 3 ([place])     Followed by CHF Clinic --> Echo 04/2014: EF 99991111, Diastolic DysFxn - elevated LVEDP & LAP. Mod Dil LA & RA.  . Falls frequently   . Hx of cardiovascular stress test 05/2014    Lexiscan Myoview (8/15):  No ischemia; EF 63% - Normal Study  . Breast cancer, right breast ([place])   . Gout   . OBSTRUCTIVE SLEEP APNEA 06/20/2009    does not use cpap  . Stroke [facility])     found on MRI in Oct. 2015.   Marland Kitchen Shortness of breath dyspnea     occasional  . Depression     takes Prozac  . GERD (gastroesophageal reflux disease)     takes Prilosec  . Arthritis   . Neuromuscular disorder (HCC)     neuropathy in both legs  . Constipation   . ETOH abuse     no alcohol since Oct. 2015  . 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 CHF (congestive heart failure) ([place])   . Supplemental oxygen dependent     hs  . Bilateral renal cysts   . Kidney disease     Stage 3  . Pulmonary HTN ([place])  a. [place] 11/16: severe pulmonary HTN, PASP 57, PA sat 42%, Ao sat 90%, CO 4.2 L/min, CI 1.88, PCWP mean 24, RA mean 14   Past Surgical History  Procedure Laterality Date  . Carotid endarterectomy  2002    left  . Coronary artery bypass graft      x 4 - 2001  . Umbilical hernia repair N/A 01/28/2014    Procedure: HERNIA REPAIR UMBILICAL ADULT/INC;  Surgeon: Harl Bowie, MD;  Location: [place];  Service: General;  Laterality: N/A;  . Laparotomy N/A 01/28/2014    Procedure: EXPLORATORY LAPAROTOMY;  Surgeon: Harl Bowie, MD;  Location: [place];  Service: General;  Laterality: N/A;  . Bowel resection N/A 01/28/2014    Procedure: SMALL BOWEL RESECTION;  Surgeon: Harl Bowie, MD;  Location: [place];  Service: General;  Laterality: N/A;  . Hernia repair    . Colonoscopy    . Total mastectomy Right 11/11/2014     Procedure: RIGHT TOTAL MASTECTOMY;  Surgeon: Excell Seltzer, MD;  Location: [place];  Service: General;  Laterality: Right;  . Cataract extraction w/phaco Left 04/08/2015    Procedure: CATARACT EXTRACTION PHACO AND INTRAOCULAR LENS PLACEMENT ([place]);  Surgeon: Birder Robson, MD;  Location: [facility];  Service: Ophthalmology;  Laterality: Left;  [place] 00:36 AP% 23.7 CDE 8.72 fluid pack [id number]  . Eye surgery Left     Catarct  . Laparotomy N/A 06/02/2015    Procedure: EXPLORATORY LAPAROTOMY;  Surgeon: Coralie Keens, MD;  Location: [place];  Service: General;  Laterality: N/A;  . Bowel resection N/A 06/02/2015    Procedure: SMALL BOWEL RESECTION;  Surgeon: Coralie Keens, MD;  Location: [place];  Service: General;  Laterality: N/A;  . Cardiac catheterization N/A 08/15/2015    Procedure: Right Heart Cath;  Surgeon: Jettie Booze, MD;  Location: [place] [facility];  Service: Cardiovascular;  Laterality: N/A;   Social History:  reports that he quit smoking about 47 years ago. He has never used smokeless tobacco. He reports that he drinks alcohol. He reports that he uses illicit drugs (Marijuana).  Allergies  Allergen Reactions  . Erythromycin Hives    Oramycin    Family History  Problem Relation Age of Onset  . CVA Mother     ?  Marland Kitchen Cancer Mother     unknown type of cancer  . CVA Father     ?  Marland Kitchen Heart attack Neg Hx   . Stomach cancer Brother 31    stomach cancer    Prior to Admission medications   Medication Sig Start Date End Date Taking? Authorizing Provider  albuterol (PROVENTIL) (2.5 MG/3ML) 0.083% nebulizer solution Take 3 mLs (2.5 mg total) by nebulization every 2 (two) hours as needed for wheezing or shortness of breath. 08/22/15  Yes Shanker Kristeen Mans, MD  allopurinol (ZYLOPRIM) 300 MG tablet Take 300 mg by mouth daily.   Yes Historical Provider, MD  aspirin EC 325 MG EC tablet Take 1 tablet (325 mg total) by mouth daily. 08/02/14  Yes Daniel J Angiulli, PA-C    atorvastatin (LIPITOR) 20 MG tablet Take 1 tablet (20 mg total) by mouth daily. 08/02/14  Yes Daniel J Angiulli, PA-C  carvedilol (COREG) 3.125 MG tablet Take 2 tablets (6.25 mg total) by mouth 2 (two) times daily with a meal. 07/07/15  Yes Bonnielee Haff, MD  cetirizine (ZYRTEC) 10 MG tablet Take 10 mg by mouth daily.   Yes Historical Provider, MD  colchicine 0.6 MG tablet Take  0.6 mg by mouth daily. 01/13/15  Yes Historical Provider, MD  fluticasone (FLOVENT HFA) 110 MCG/ACT inhaler Inhale 1 puff into the lungs 2 (two) times daily.    Yes Historical Provider, MD  folic acid (FOLVITE) 1 MG tablet Take 1 tablet (1 mg total) by mouth daily. 08/02/14  Yes Daniel J Angiulli, PA-C  gabapentin (NEURONTIN) 300 MG capsule Take 1 capsule (300 mg total) by mouth 2 (two) times daily. 08/02/14  Yes Daniel J Angiulli, PA-C  guaiFENesin (MUCINEX) 600 MG 12 hr tablet Take 600 mg by mouth 2 (two) times daily.   Yes Historical Provider, MD  hydrALAZINE (APRESOLINE) 100 MG tablet Take 1 tablet (100 mg total) by mouth every 8 (eight) hours. 08/22/15  Yes Shanker Kristeen Mans, MD  insulin aspart (NOVOLOG) 100 UNIT/ML injection 0-9 Units, Subcutaneous, 3 times daily with meals, First dose on Tue 08/12/15 at 0800 Correction coverage: Sensitive (thin, NPO, renal) CBG < 70: implement hypoglycemia protocol CBG 70 - 120: 0 units CBG 121 - 150: 1 unit CBG 151 - 200: 2 units CBG 201 - 250: 3 units CBG 251 - 300: 5 units CBG 301 - 350: 7 units CBG 351 - 400: 9 units CBG > 400: call MD 08/22/15  Yes Shanker Kristeen Mans, MD  Insulin Glargine (LANTUS SOLOSTAR) 100 UNIT/ML Solostar Pen Inject 6 Units into the skin daily at 10 pm.   Yes Historical Provider, MD  ipratropium-albuterol (DUONEB) 0.5-2.5 (3) MG/3ML SOLN Take 3 mLs by nebulization 2 (two) times daily. 08/22/15  Yes Shanker Kristeen Mans, MD  isosorbide dinitrate (ISORDIL) 10 MG tablet Take 1 tablet (10 mg total) by mouth 3 (three) times daily. 08/22/15  Yes Shanker Kristeen Mans,  MD  levothyroxine (SYNTHROID, LEVOTHROID) 125 MCG tablet Take 1.5 tablets (187 mcg total) by mouth daily before breakfast. 08/02/14  Yes Daniel J Angiulli, PA-C  magnesium oxide (MAG-OX) 400 (241.3 MG) MG tablet Take 1 tablet (400 mg total) by mouth 2 (two) times daily. 06/26/15  Yes Scott Joylene Draft, PA-C  metolazone (ZAROXOLYN) 5 MG tablet Take 5 mg by mouth every evening.   Yes Historical Provider, MD  omeprazole (PRILOSEC) 20 MG capsule Take 1 capsule (20 mg total) by mouth daily. 08/02/14  Yes Daniel J Angiulli, PA-C  Oxycodone HCl 10 MG TABS Take 1 tablet (10 mg total) by mouth 3 (three) times daily as needed (pain). 08/22/15  Yes Shanker Kristeen Mans, MD  potassium chloride SA (KLOR-CON M20) 20 MEQ tablet Take 1 tablet (20 mEq total) by mouth 2 (two) times daily. 08/22/15  Yes Shanker Kristeen Mans, MD  saccharomyces boulardii (FLORASTOR) 250 MG capsule Take 250 mg by mouth 2 (two) times daily.   Yes Historical Provider, MD  sildenafil (REVATIO) 20 MG tablet Take 1 tablet (20 mg total) by mouth 3 (three) times daily. 08/22/15  Yes Shanker Kristeen Mans, MD  tamoxifen (NOLVADEX) 20 MG tablet Take 1 tablet (20 mg total) by mouth daily. 09/09/14  Yes Nicholas Lose, MD  torsemide (DEMADEX) 20 MG tablet Take 80 mg by mouth 2 (two) times daily.   Yes Historical Provider, MD  venlafaxine XR (EFFEXOR-XR) 75 MG 24 hr capsule Take 225 mg by mouth daily with breakfast.    Yes Historical Provider, MD   Physical Exam: Filed Vitals:   10/16/15 1615 10/16/15 1630 10/16/15 1700 10/16/15 1755  BP: 122/72 126/70 131/68 108/71  Pulse: 87 87 84 82  Temp:      TempSrc:      Resp: 19  17 19 19   Height:    5\' 9"  (1.753 m)  Weight:    118.933 kg (262 lb 3.2 oz)  SpO2: 98% 97% 97% 97%    Wt Readings from Last 3 Encounters:  10/16/15 118.933 kg (262 lb 3.2 oz)  10/16/15 116.302 kg (256 lb 6.4 oz)  10/14/15 120.203 kg (265 lb)    General:  Appears calm and comfortable Eyes: PERRL, normal lids, irises &  conjunctiva Neck: no LAD, masses or thyromegaly Cardiovascular: irregular, no murmer,  Respiratory: no wheezing orr honchi, scattered rales and diminished at bases.  Abdomen: soft, ntnd Skin: no rash or induration seen on limited exam Musculoskeletal: grossly normal tone BUE/BLE, lower extremities with compression wraps.  Neurologic: pleasantly confused.           Labs on Admission:  Basic Metabolic Panel:  Recent Labs Lab 10/16/15 1330  NA 139  K 3.5  CL 90*  CO2 42*  GLUCOSE 143*  BUN 54*  CREATININE 1.55*  CALCIUM 9.0   Liver Function Tests:  Recent Labs Lab 10/16/15 1330  AST 16  ALT 8*  ALKPHOS 56  BILITOT 0.5  PROT 6.6  ALBUMIN 2.8*   No results for input(s): LIPASE, AMYLASE in the last 168 hours.  Recent Labs Lab 10/16/15 1330  AMMONIA 45*   CBC:  Recent Labs Lab 10/16/15 1330  WBC 5.4  NEUTROABS 3.6  HGB 9.5*  HCT 32.5*  MCV 101.9*  PLT 125*   Cardiac Enzymes: No results for input(s): CKTOTAL, CKMB, CKMBINDEX, TROPONINI in the last 168 hours.  BNP (last 3 results)  Recent Labs  09/03/15 1604 09/25/15 1245 10/16/15 1330  BNP 395.2* 365.1* 851.3*    ProBNP (last 3 results) No results for input(s): PROBNP in the last 8760 hours.  CBG: No results for input(s): GLUCAP in the last 168 hours.  Radiological Exams on Admission: Dg Chest 2 View  10/16/2015  CLINICAL DATA:  Hypoxia for several days. Confusion. History of breast carcinoma EXAM: CHEST  2 VIEW COMPARISON:  August 15, 2015 FINDINGS: There is patchy airspace consolidation in both lung bases. There are small pleural effusions as well. The heart is mildly enlarged with pulmonary vascularity within normal limits. Patient is status post coronary artery bypass grafting. No adenopathy. There is degenerative change in the thoracic spine. IMPRESSION: Bibasilar airspace consolidation. Suspect pneumonia, although alveolar edema could present in this manner. There may be both congestive  heart failure and pneumonia present. Note that there are small pleural effusions bilaterally with cardiomegaly. Electronically Signed   By: Lowella Grip III M.D.   On: 10/16/2015 14:34    EKG: afib with non specific t wave abn in the lateral leads.   Assessment/Plan Active Problems:   Pleural effusion   Fluid overload   Acute on chronic diastolic heart failure; Elevated bnp and cxr shows CHF. Admitted to telemetry and started him on IV Lasix 40 mg TID,  Daily weights and strict intake and output.  Resume the rest of the home meds.    Acute respiratroy failure with hypercarbia: No hypoxia on exam , currently on 3 lit [place] oxygen with good sats in upper 90%.  Empirically starteD on IV vanco and IV zosyn for hcap, he is afebrile and on leukocytosis and he does not appear toxic,  His lactic acid is also normal today.  Would suggest a repeat CXR after appropriate diuresis and transition to a short course of antibiotics.    Elevated ammonia level:  Confused on exam.  Lactulose bid and follow levels and mental status.    Diabetes mellitus: resuME SSI  CBG (last 3)  No results for input(s): GLUCAP in the last 72 hours.  Stage 3 CKD; Baseline.    Macrocytic anemia: on folic acid supplementation. Monitor.       Code Status: FULL CODE.  DVT Prophylaxis: Family Communication: none at bedside.  Disposition Plan: pending PT eval  Time spent: 65 min  [facility] Pager [phone]

## 2015-10-16 NOTE — Progress Notes (Signed)
Advanced Heart Failure Medication Review by a Pharmacist  Does the patient  feel that his/her medications are working for him/her?  yes  Has the patient been experiencing any side effects to the medications prescribed?  no  Does the patient measure his/her own blood pressure or blood glucose at home?  yes   Does the patient have any problems obtaining medications due to transportation or finances?   no  Understanding of regimen: poor Understanding of indications: poor Potential of compliance: excellent Patient understands to avoid NSAIDs. Patient understands to avoid decongestants.  Issues to address at subsequent visits: None   Pharmacist comments:  [name] is a 70 yo M presenting with his daughter from [employer] with a current MAR. All medications were updated. He did receive 3 days of metolazone 5 mg daily from 1/2-1/4. All other discrepancies/changes were reconciled.   Ruta Hinds. Velva Harman, PharmD, BCPS, CPP Clinical Pharmacist Pager: [phone] Phone: [phone] 10/16/2015 12:38 PM     Time with patient: 4 minutes Preparation and documentation time: 8 minutes Total time: 12 minutes

## 2015-10-16 NOTE — ED Notes (Signed)
Unable to obtain urine at this time due to patient anatomy.

## 2015-10-16 NOTE — Progress Notes (Signed)
ANTIBIOTIC CONSULT NOTE - INITIAL  Pharmacy Consult for vancomycin and zosyn Indication: pneumonia  Allergies  Allergen Reactions  . Erythromycin Hives    Oramycin    Patient Measurements: Height: 5\' 9"  (175.3 cm) Weight: 262 lb 3.2 oz (118.933 kg) IBW/kg (Calculated) : 70.7   Vital Signs: Temp: 97.9 F (36.6 C) (01/05 1459) Temp Source: Oral (01/05 1459) BP: 108/71 mmHg (01/05 1755) Pulse Rate: 82 (01/05 1755) Intake/Output from previous day:   Intake/Output from this shift:    Labs:  Recent Labs  10/16/15 1330  WBC 5.4  HGB 9.5*  PLT 125*  CREATININE 1.55*   Estimated Creatinine Clearance: 55.6 mL/min (by C-G formula based on Cr of 1.55). No results for input(s): VANCOTROUGH, VANCOPEAK, VANCORANDOM, GENTTROUGH, GENTPEAK, GENTRANDOM, TOBRATROUGH, TOBRAPEAK, TOBRARND, AMIKACINPEAK, AMIKACINTROU, AMIKACIN in the last 72 hours.   Microbiology: No results found for this or any previous visit (from the past 720 hour(s)).  Medical History: Past Medical History  Diagnosis Date  . CHRONIC OBSTRUCTIVE PULMONARY DISEASE 06/20/2009  . CAROTID STENOSIS 06/20/2009    A. 08/2001 s/p L CEA;  B.   09/14/11 - Carotid U/S - 40-59% bilateral stenosis, left CEA patch angioplasty is patent  . DM 06/20/2009  . CAD 06/20/2009    A.  08/2000 - s/p CABG x 4 - LIMA-LAD, Left Radial-OM, VG-DIAG, VG-RCA;  B. Neg. MV  2010  . HYPERLIPIDEMIA 06/20/2009  . HYPERTENSION 06/20/2009  . Hypothyroidism   . Low back pain   . Asthma     as child  . Pneumonia   . Persistent atrial fibrillation (HCC)     Not felt to be coumadin candidate 2/2 ETOH use.  Marland Kitchen History of tobacco abuse     remote - quit 1970  . Morbidly obese ([place])   . Chronic diastolic heart failure, NYHA class 3 ([place])     Followed by CHF Clinic --> Echo 04/2014: EF [id number], Diastolic DysFxn - elevated LVEDP & LAP. Mod Dil LA & RA.  . Falls frequently   . Hx of cardiovascular stress test 05/2014    Lexiscan Myoview (8/15):  No  ischemia; EF 63% - Normal Study  . Breast cancer, right breast ([place])   . Gout   . OBSTRUCTIVE SLEEP APNEA 06/20/2009    does not use cpap  . Stroke [facility])     found on MRI in Oct. 2015.   Marland Kitchen Shortness of breath dyspnea     occasional  . Depression     takes Prozac  . GERD (gastroesophageal reflux disease)     takes Prilosec  . Arthritis   . Neuromuscular disorder (HCC)     neuropathy in both legs  . Constipation   . ETOH abuse     no alcohol since Oct. 2015  . 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 CHF (congestive heart failure) ([place])   . Supplemental oxygen dependent     hs  . Bilateral renal cysts   . Kidney disease     Stage 3  . Pulmonary HTN ([place])     a. [place] 11/16: severe pulmonary HTN, PASP 57, PA sat 42%, Ao sat 90%, CO 4.2 L/min, CI 1.88, PCWP mean 24, RA mean 14   Assessment: 70 y.o. male with a hx of CAD, status post CABG in 2001, atrial fibrillation, COPD, sleep apnea, CKD stage III, diastolic CHF, carotid stenosis s/p L CEA, incarcerated hernia with small bowel resection 4/15  c/b enterocutaneous fistula, alcohol abuse, not a candidate for coumadin secondary to non compliance and alcohol abuse, morbid obesity, chronic diastolic heart failure, was seen today at HF clinic for worsening lower extremity edema.  New orders received to start vancomycin and zosyn for possible pneumonia.  Afebrile, wbc 5.4, scr 1.55, LA now down 1.4>>0.65  Goal of Therapy:  Vancomycin trough level 15-20 mcg/ml  Plan:  Measure antibiotic drug levels at steady state Follow up culture results Zosyn 3.375g IV q8 hours Vancomycin 2g IV now then 1250mg  IV q24  Erin Hearing PharmD., BCPS Clinical Pharmacist Pager [phone] 10/16/2015 8:15 PM

## 2015-10-17 ENCOUNTER — Inpatient Hospital Stay (HOSPITAL_COMMUNITY): Payer: Medicare Other

## 2015-10-17 DIAGNOSIS — J690 Pneumonitis due to inhalation of food and vomit: Secondary | ICD-10-CM

## 2015-10-17 DIAGNOSIS — E7220 Disorder of urea cycle metabolism, unspecified: Secondary | ICD-10-CM

## 2015-10-17 DIAGNOSIS — J90 Pleural effusion, not elsewhere classified: Secondary | ICD-10-CM

## 2015-10-17 DIAGNOSIS — J9621 Acute and chronic respiratory failure with hypoxia: Secondary | ICD-10-CM

## 2015-10-17 DIAGNOSIS — I5033 Acute on chronic diastolic (congestive) heart failure: Secondary | ICD-10-CM

## 2015-10-17 DIAGNOSIS — G9340 Encephalopathy, unspecified: Secondary | ICD-10-CM

## 2015-10-17 DIAGNOSIS — I480 Paroxysmal atrial fibrillation: Secondary | ICD-10-CM

## 2015-10-17 DIAGNOSIS — J9601 Acute respiratory failure with hypoxia: Secondary | ICD-10-CM

## 2015-10-17 DIAGNOSIS — I481 Persistent atrial fibrillation: Secondary | ICD-10-CM

## 2015-10-17 LAB — BASIC METABOLIC PANEL
Anion gap: 9 (ref 5–15)
BUN: 52 mg/dL — AB (ref 6–20)
CO2: 42 mmol/L — AB (ref 22–32)
CREATININE: 1.44 mg/dL — AB (ref 0.61–1.24)
Calcium: 9 mg/dL (ref 8.9–10.3)
Chloride: 90 mmol/L — ABNORMAL LOW (ref 101–111)
GFR calc Af Amer: 55 mL/min — ABNORMAL LOW (ref >60)
GFR calc non Af Amer: 47 mL/min — ABNORMAL LOW (ref >60)
GLUCOSE: 129 mg/dL — AB (ref 65–99)
Potassium: 2.8 mmol/L — ABNORMAL LOW (ref 3.5–5.1)
SODIUM: 141 mmol/L (ref 135–145)

## 2015-10-17 LAB — GLUCOSE, CAPILLARY
GLUCOSE-CAPILLARY: 229 mg/dL — AB (ref 65–99)
Glucose-Capillary: 130 mg/dL — ABNORMAL HIGH (ref 65–99)
Glucose-Capillary: 149 mg/dL — ABNORMAL HIGH (ref 65–99)
Glucose-Capillary: 116 mg/dL — ABNORMAL HIGH (ref 65–99)

## 2015-10-17 LAB — CBC
HCT: 35.3 % — ABNORMAL LOW (ref 39.0–52.0)
Hemoglobin: 10 g/dL — ABNORMAL LOW (ref 13.0–17.0)
MCH: 28.7 pg (ref 26.0–34.0)
MCHC: 28.3 g/dL — ABNORMAL LOW (ref 30.0–36.0)
MCV: 101.1 fL — AB (ref 78.0–100.0)
PLATELETS: 139 10*3/uL — AB (ref 150–400)
RBC: 3.49 MIL/uL — ABNORMAL LOW (ref 4.22–5.81)
RDW: 17.3 % — AB (ref 11.5–15.5)
WBC: 5.6 10*3/uL (ref 4.0–10.5)

## 2015-10-17 LAB — MAGNESIUM: MAGNESIUM: 1.7 mg/dL (ref 1.7–2.4)

## 2015-10-17 LAB — PROCALCITONIN: Procalcitonin: <0.1 ng/mL

## 2015-10-17 MED ORDER — SODIUM CHLORIDE 0.9 % IJ SOLN: 10.0000 mL | INTRAMUSCULAR | Status: DC | PRN

## 2015-10-17 MED ORDER — SILDENAFIL CITRATE 20 MG PO TABS
20.0000 mg | ORAL_TABLET | Freq: Three times a day (TID) | ORAL | Status: DC
  Administered 2015-10-17 – 2015-10-23 (×18): 20 mg via ORAL
  Filled 2015-10-17 (×18): qty 1

## 2015-10-17 MED ORDER — ENOXAPARIN SODIUM 60 MG/0.6ML ~~LOC~~ SOLN
60.0000 mg | SUBCUTANEOUS | Status: DC
  Administered 2015-10-17 – 2015-10-20 (×4): 60 mg via SUBCUTANEOUS
  Filled 2015-10-17 (×6): qty 0.6

## 2015-10-17 MED ORDER — ACETAZOLAMIDE 250 MG PO TABS
500.0000 mg | ORAL_TABLET | Freq: Two times a day (BID) | ORAL | Status: AC
  Administered 2015-10-17 – 2015-10-19 (×5): 500 mg via ORAL
  Filled 2015-10-17 (×7): qty 2

## 2015-10-17 MED ORDER — POTASSIUM CHLORIDE 20 MEQ/15ML (10%) PO SOLN
40.0000 meq | ORAL | Status: AC
  Administered 2015-10-17 (×2): 40 meq via ORAL
  Filled 2015-10-17 (×2): qty 30

## 2015-10-17 MED ORDER — BISOPROLOL FUMARATE 5 MG PO TABS
2.5000 mg | ORAL_TABLET | Freq: Every day | ORAL | Status: DC
  Administered 2015-10-18 – 2015-10-23 (×5): 2.5 mg via ORAL
  Filled 2015-10-17 (×5): qty 1

## 2015-10-17 MED ORDER — HYDRALAZINE HCL 25 MG PO TABS
12.5000 mg | ORAL_TABLET | Freq: Three times a day (TID) | ORAL | Status: DC
  Administered 2015-10-17 – 2015-10-23 (×17): 12.5 mg via ORAL
  Filled 2015-10-17 (×18): qty 1

## 2015-10-17 MED ORDER — SODIUM CHLORIDE 0.9 % IJ SOLN
10.0000 mL | Freq: Two times a day (BID) | INTRAMUSCULAR | Status: DC
  Administered 2015-10-17 – 2015-10-23 (×10): 10 mL

## 2015-10-17 MED ORDER — ACETAZOLAMIDE 250 MG PO TABS
500.0000 mg | ORAL_TABLET | Freq: Two times a day (BID) | ORAL | Status: DC
  Filled 2015-10-17 (×2): qty 2

## 2015-10-17 MED ORDER — POTASSIUM CHLORIDE 20 MEQ/15ML (10%) PO SOLN
20.0000 meq | Freq: Two times a day (BID) | ORAL | Status: DC
  Administered 2015-10-17 – 2015-10-18 (×3): 20 meq via ORAL
  Filled 2015-10-17 (×4): qty 15

## 2015-10-17 NOTE — Progress Notes (Signed)
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 The patient was seen at the heart failure clinic on 10/16/2015 for worsening lower extremity edema. He was noted to be lethargic with oxygen saturation of 79%. He was sent to the emergency department for further evaluation. Assessment/Plan: Acute on chronic diastolic CHF/cor pulmonale-right heart failure -The patient was previously admitted July 2015 with acute on chronic diastolic CHF and developed cardiorenal syndrome requiring temporary dialysis -August 2015 Myoview negative for ischemia -At skilled nursing facility, patient's torsemide was increased to 80 mg twice a day on 09/26/2015 -Metolazone was given 1/2, 1/3, 1/4 -08/22/2015 discharge weight 224 pounds -10/17/2015 weight 262 -Continue intravenous furosemide per cardiology -08/16/2015 echo EF 55-60%, PAP 82 -continue sildafenil Acute on chronic respiratory failure with hypoxia and hypercarbia -Multifactorial including pulmonary hypertension CHF, COPD, possible pneumonia, and likely underlying OHS/OSA -continue sildafenil -wean oxygen to keep saturation 90-94% TO AVOID HYPERCARBIA  Pulmonary infiltrates/consolidation -Concern about possible aspiration--pt likely chronically aspirating  -Chek procalcitonin -speech therapy has been consulted  Acute encephalopathy -Multifactorial including hyperammonemia, hypercarbia, hypoxia, pneumonia -Last used alcohol greater than 4 months ago -Suspect underlying cognitive impairment  Hyperammonemia -Likely due to chronic hepatic  congestion secondary to his pulmonary hypertension -08/12/2015 ammonia 18 -10/16/2015 ammonia 45 -Given the patient's confusion continue lactulose  Atrial fibrillation -Not felt to be a candidate for anticoagulation secondary to poor compliance and continued alcohol use -Rate controlled -Continue bisoprolol -CHADSVASc = 6 -Continue aspirin  Thrombocytopenia -Likely due to underlying cirrhosis/chronic hepatic congestion -Chronic -Monitor for objective signs of bleeding  Diabetes mellitus type 2  -08/17/2015 hemoglobin A1c 6.8  -Previous problems with hypoglycemia on oral agents  -NovoLog sliding scale   CAD status post CABG:  -Continue aspirin, statins and beta blockers  Hypertension -Continue bisoprolol, hydralazine, Imdur  History of right breast cancer: Status post mastectomy, continue tamoxifen.Follows with Dr Lindi Adie History of enterocutaneous fistula:  -Has post exploratory laparotomy and bowel resection in August 2016. Chronic small ulceration in the anterior abdominal wall that appears stable History of stroke  -October 2015  -continue ASA  Goals of Care -long discussion with daughter at [facility] with DNR but continue aggressive tx for now   Family Communication:   Updated daughter at bedside--total time 45 min Disposition Plan:   Home when medically stable       Procedures/Studies: Dg Chest 2 View  10/16/2015  CLINICAL DATA:  Hypoxia for several days. Confusion. History of breast carcinoma EXAM: CHEST  2 VIEW COMPARISON:  August 15, 2015 FINDINGS: There is patchy airspace consolidation in both lung bases. There are small pleural effusions as well. The heart is mildly enlarged with pulmonary vascularity within normal limits. Patient is status post coronary artery bypass grafting. No adenopathy. There is degenerative change in the thoracic spine. IMPRESSION: Bibasilar airspace consolidation. Suspect pneumonia, although alveolar edema could present in this  manner. There may be both congestive heart failure and pneumonia present. Note that there are small pleural effusions bilaterally with cardiomegaly. Electronically Signed   By: Lowella Grip III M.D.   On: 10/16/2015 14:34  Subjective: Patient is somnolent but arousable. He denies any chest pain or shortness of breath. He complains of loose stools. No reports of vomiting, respirations distress, uncontrolled pain. The patient denies any headache or abdominal pain.  Objective: Filed Vitals:   10/16/15 2000 10/17/15 0000 10/17/15 0357 10/17/15 0400  BP: 124/71 117/64  129/74  Pulse: 79 80  73  Temp:   98.7 F (37.1 C)   TempSrc:   Oral   Resp: 19 21    Height:      Weight:      SpO2: 97% 96%  92%    Intake/Output Summary (Last 24 hours) at 10/17/15 0705 Last data filed at 10/17/15 0201  Gross per 24 hour  Intake    240 ml  Output   2200 ml  Net  -1960 ml   Weight change:  Exam:   General:  Pt is alert, follows commands appropriately, not in acute distress  HEENT: No icterus, No thrush, No neck mass, [place]/AT  Cardiovascular: RRR, S1/S2, no rubs, no gallops  Respiratory: Bibasilar crackles. No wheezing. Good Air movement  Abdomen: Soft/+BS, non tender, non distended, no guarding  Extremities: 2 + edema, No lymphangitis, No petechiae, No rashes, no synovitis; UNNA boots on  Data Reviewed: Basic Metabolic Panel:  Recent Labs Lab 10/16/15 1330 10/17/15 0524  NA 139 141  K 3.5 2.8*  CL 90* 90*  CO2 42* 42*  GLUCOSE 143* 129*  BUN 54* 52*  CREATININE 1.55* 1.44*  CALCIUM 9.0 9.0   Liver Function Tests:  Recent Labs Lab 10/16/15 1330  AST 16  ALT 8*  ALKPHOS 56  BILITOT 0.5  PROT 6.6  ALBUMIN 2.8*   No results for input(s): LIPASE, AMYLASE in the last 168 hours.  Recent Labs Lab 10/16/15 1330  AMMONIA 45*   CBC:  Recent Labs Lab 10/16/15 1330 10/17/15 0524  WBC 5.4 5.6  NEUTROABS 3.6  --   HGB 9.5* 10.0*  HCT 32.5* 35.3*  MCV  101.9* 101.1*  PLT 125* 139*   Cardiac Enzymes: No results for input(s): CKTOTAL, CKMB, CKMBINDEX, TROPONINI in the last 168 hours. BNP: Invalid input(s): POCBNP CBG:  Recent Labs Lab 10/16/15 2000  GLUCAP 125*    No results found for this or any previous visit (from the past 240 hour(s)).   Scheduled Meds: . antiseptic oral rinse  7 mL Mouth Rinse BID  . aspirin EC  325 mg Oral Daily  . atorvastatin  20 mg Oral QHS  . carvedilol  6.25 mg Oral BID WC  . enoxaparin (LOVENOX) injection  30 mg Subcutaneous Q24H  . folic acid  1 mg Oral Daily  . furosemide  40 mg Intravenous TID  . gabapentin  300 mg Oral BID  . insulin aspart  0-5 Units Subcutaneous QHS  . insulin aspart  0-9 Units Subcutaneous TID WC  . insulin glargine  6 Units Subcutaneous QHS  . ipratropium-albuterol  3 mL Nebulization BID  . isosorbide dinitrate  10 mg Oral TID  . lactulose  20 g Oral BID  . levothyroxine  187 mcg Oral QAC breakfast  . pantoprazole  40 mg Oral Daily  . piperacillin-tazobactam (ZOSYN)  IV  3.375 g Intravenous 3 times per day  . sodium chloride  3 mL Intravenous Q12H  . tamoxifen  20 mg Oral Daily  . vancomycin  1,250 mg Intravenous Q24H  . venlafaxine XR  225 mg Oral Q breakfast   Continuous Infusions:    Kwesi Sangha, DO  [facility] Pager [phone]  If 7PM-7AM, please contact night-coverage [email or web address] Password [email or web address] 10/17/2015, 7:05 AM   LOS: 1 day

## 2015-10-17 NOTE — Evaluation (Signed)
Clinical/Bedside Swallow Evaluation Patient Details  Name: [name] MRN: [id number] Date of Birth: Apr 22, 1946  Today's Date: 10/17/2015 Time: SLP Start Time (ACUTE ONLY): 1310 SLP Stop Time (ACUTE ONLY): 1340 SLP Time Calculation (min) (ACUTE ONLY): 30 min  Past Medical History:  Past Medical History  Diagnosis Date  . CHRONIC OBSTRUCTIVE PULMONARY DISEASE 06/20/2009  . CAROTID STENOSIS 06/20/2009    A. 08/2001 s/p L CEA;  B.   09/14/11 - Carotid U/S - 40-59% bilateral stenosis, left CEA patch angioplasty is patent  . DM 06/20/2009  . CAD 06/20/2009    A.  08/2000 - s/p CABG x 4 - LIMA-LAD, Left Radial-OM, VG-DIAG, VG-RCA;  B. Neg. MV  2010  . HYPERLIPIDEMIA 06/20/2009  . HYPERTENSION 06/20/2009  . Hypothyroidism   . Low back pain   . Asthma     as child  . Pneumonia   . Persistent atrial fibrillation (HCC)     Not felt to be coumadin candidate 2/2 ETOH use.  Marland Kitchen History of tobacco abuse     remote - quit 1970  . Morbidly obese ([place])   . Chronic diastolic heart failure, NYHA class 3 ([place])     Followed by CHF Clinic --> Echo 04/2014: EF [id number], Diastolic DysFxn - elevated LVEDP & LAP. Mod Dil LA & RA.  . Falls frequently   . Hx of cardiovascular stress test 05/2014    Lexiscan Myoview (8/15):  No ischemia; EF 63% - Normal Study  . Breast cancer, right breast ([place])   . Gout   . OBSTRUCTIVE SLEEP APNEA 06/20/2009    does not use cpap  . Stroke [facility])     found on MRI in Oct. 2015.   Marland Kitchen Shortness of breath dyspnea     occasional  . Depression     takes Prozac  . GERD (gastroesophageal reflux disease)     takes Prilosec  . Arthritis   . Neuromuscular disorder (HCC)     neuropathy in both legs  . Constipation   . ETOH abuse     no alcohol since Oct. 2015  . 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 CHF (congestive heart failure) ([place])   . Supplemental oxygen dependent     hs  . Bilateral renal cysts   . Kidney disease      Stage 3  . Pulmonary HTN ([place])     a. [place] 11/16: severe pulmonary HTN, PASP 57, PA sat 42%, Ao sat 90%, CO 4.2 L/min, CI 1.88, PCWP mean 24, RA mean 14   Past Surgical History:  Past Surgical History  Procedure Laterality Date  . Carotid endarterectomy  2002    left  . Coronary artery bypass graft      x 4 - 2001  . Umbilical hernia repair N/A 01/28/2014    Procedure: HERNIA REPAIR UMBILICAL ADULT/INC;  Surgeon: Harl Bowie, MD;  Location: [place];  Service: General;  Laterality: N/A;  . Laparotomy N/A 01/28/2014    Procedure: EXPLORATORY LAPAROTOMY;  Surgeon: Harl Bowie, MD;  Location: [place];  Service: General;  Laterality: N/A;  . Bowel resection N/A 01/28/2014    Procedure: SMALL BOWEL RESECTION;  Surgeon: Harl Bowie, MD;  Location: [place];  Service: General;  Laterality: N/A;  . Hernia repair    . Colonoscopy    . Total mastectomy Right 11/11/2014    Procedure: RIGHT TOTAL MASTECTOMY;  Surgeon: Excell Seltzer, MD;  Location: [facility];  Service: General;  Laterality: Right;  . Cataract extraction w/phaco Left 04/08/2015    Procedure: CATARACT EXTRACTION PHACO AND INTRAOCULAR LENS PLACEMENT (IOC);  Surgeon: Birder Robson, MD;  Location: [facility] ORS;  Service: Ophthalmology;  Laterality: Left;  [place] 00:36 AP% 23.7 CDE 8.72 fluid pack [id number] H  . Eye surgery Left     Catarct  . Laparotomy N/A 06/02/2015    Procedure: EXPLORATORY LAPAROTOMY;  Surgeon: Coralie Keens, MD;  Location: [place];  Service: General;  Laterality: N/A;  . Bowel resection N/A 06/02/2015    Procedure: SMALL BOWEL RESECTION;  Surgeon: Coralie Keens, MD;  Location: [place];  Service: General;  Laterality: N/A;  . Cardiac catheterization N/A 08/15/2015    Procedure: Right Heart Cath;  Surgeon: Jettie Booze, MD;  Location: [place] CV LAB;  Service: Cardiovascular;  Laterality: N/A;   HPI:  70 y.o. male, SNF resident, with a hx of CAD, status post CABG in 2001, atrial fibrillation, COPD,  sleep apnea, CKD stage III, diastolic CHF, carotid stenosis s/p L CEA, incarcerated hernia with small bowel resection 4/15 c/b enterocutaneous fistula, alcohol abuse, morbid obesity, chronic diastolic heart failure, seen at HF clinic for worsening lower extremity edema. During the exam he was found to be more confused that usual and had tremors in his upper extremities, hypoxic with oxygen sats in 79%, lethargic, and weight gain. He was sent to ED for further evaluation for possible hepatic encephalopathy and to rule out pneumonia. His CXR in the ED shows bibasilar airspace consolidation, suggesting pneumonia vs alveolar edema.  Admitted with acute respiratory failure, pleural effusion, fluid overload.  Pt has been seen by SLP services in the past for cognitive evaluations.     Assessment / Plan / Recommendation Clinical Impression  Pt initially lethargic upon entering room, but MS improved and pt became more participatory.  Assisted with lunch tray during swallowing assessment.  Pt with active but prolonged mastication, brisk swallow response, and no overt s/s of aspiration with large, successive sips of thin liquid.  Dtr present for assessment.  Recommend continuing current diet with thin liquids; sit as upright as pt will tolerate; if pt coughs with pills in water, give with puree.  D/w dtr basic aspiration risks.  No further SLP f/u indicated - will sign off.     Aspiration Risk  Mild aspiration risk    Diet Recommendation   regular, thin liquids  Medication Administration: Whole meds with liquid    Other  Recommendations Oral Care Recommendations: Oral care BID   Follow up Recommendations  None    Frequency and Duration            Prognosis        Swallow Study   General HPI: 70 y.o. male, SNF resident, with a hx of CAD, status post CABG in 2001, atrial fibrillation, COPD, sleep apnea, CKD stage III, diastolic CHF, carotid stenosis s/p L CEA, incarcerated hernia with small bowel  resection 4/15 c/b enterocutaneous fistula, alcohol abuse, morbid obesity, chronic diastolic heart failure, seen at HF clinic for worsening lower extremity edema. During the exam he was found to be more confused that usual and had tremors in his upper extremities, hypoxic with oxygen sats in 79%, lethargic, and weight gain. He was sent to ED for further evaluation for possible hepatic encephalopathy and to rule out pneumonia. His CXR in the ED shows bibasilar airspace consolidation, suggesting pneumonia vs alveolar edema.  Admitted with acute respiratory failure, pleural effusion,  fluid overload.  Pt has been seen by SLP services in the past for cognitive evaluations.   Type of Study: Bedside Swallow Evaluation Previous Swallow Assessment: no Diet Prior to this Study: Regular;Thin liquids Temperature Spikes Noted: Yes Respiratory Status: Nasal cannula History of Recent Intubation: No Behavior/Cognition: Alert;Cooperative Oral Cavity Assessment: Within Functional Limits Oral Care Completed by SLP: No Oral Cavity - Dentition: Adequate natural dentition Vision: Functional for self-feeding Self-Feeding Abilities: Needs set up;Needs assist Patient Positioning: Partially reclined Baseline Vocal Quality: Normal Volitional Cough: Strong Volitional Swallow: Able to elicit    Oral/Motor/Sensory Function Overall Oral Motor/Sensory Function: Within functional limits   Ice Chips Ice chips: Within functional limits Presentation: Spoon   Thin Liquid Thin Liquid: Within functional limits Presentation: Cup;Straw    Nectar Thick Nectar Thick Liquid: Not tested   Honey Thick Honey Thick Liquid: Not tested   Puree Puree: Within functional limits   Solid   GO    Solid: Within functional limits       Juan Quam Laurice 10/17/2015,1:51 PM

## 2015-10-17 NOTE — Care Management Note (Signed)
Case Management Note Marvetta Gibbons RN, BSN Unit 2W-Case Manager [phone] Covering 3W  Patient Details  Name: [name] MRN: [id number] Date of Birth: Sep 15, 1946  Subjective/Objective:   Pt admitted with Pl. Effusion/CHF- ?PNA                 Action/Plan: PTA pt Currently at [facility].  (prior to STSNF- lived at home- has daughter Darrold Junker # [phone]- DME at home includes RW and 3n1, he also had home 02. Pt is following by the heart clinic for CHF-  Has been active with [facility] in the past)  Expected Discharge Date:                  Expected Discharge Plan:   Skilled Facility  In-House Referral:     Discharge planning Services     Post Acute Care Choice:    Choice offered to:     DME Arranged:    DME Agency:     HH Arranged:    [place] Agency:     Status of Service:     Medicare Important Message Given:    Date Medicare IM Given:    Medicare IM give by:    Date Additional Medicare IM Given:    Additional Medicare Important Message give by:     If discussed at [place] of Stay Meetings, dates discussed:    Additional Comments:    Dawayne Patricia, RN 10/17/2015, 11:01 AM

## 2015-10-17 NOTE — Progress Notes (Signed)
Utilization review completed.  

## 2015-10-17 NOTE — Plan of Care (Signed)
Problem: Skin Integrity: Goal: Risk for impaired skin integrity will decrease Outcome: Progressing Turning pt q2 hrs, MASD on sacrum, and perineal folds, applied moisture barrier and anti-fungal powder. Elevating feet,

## 2015-10-17 NOTE — Progress Notes (Signed)
Axillary temp 99.1, tylenol given

## 2015-10-17 NOTE — Addendum Note (Signed)
Encounter addended by: Jolaine Artist, MD on: 10/17/2015 10:34 PM<BR>     Documentation filed: Follow-up Section, Notes Section

## 2015-10-17 NOTE — Progress Notes (Signed)
Peripherally Inserted Central Catheter/Midline Placement  The IV Nurse has discussed with the patient and/or persons authorized to consent for the patient, the purpose of this procedure and the potential benefits and risks involved with this procedure.  The benefits include less needle sticks, lab draws from the catheter and patient may be discharged home with the catheter.  Risks include, but not limited to, infection, bleeding, blood clot (thrombus formation), and puncture of an artery; nerve damage and irregular heat beat.  Alternatives to this procedure were also discussed.  PICC/Midline Placement Documentation    Telephone consent by Daughter, Caprice Kluver Albarece 10/17/2015, 10:50 AM

## 2015-10-17 NOTE — Consult Note (Signed)
Advanced Heart Failure Team Consult Note  Referring Physician: Dr Tat Primary Physician: Primary Cardiologist:  Dr Haroldine Laws  Reason for Consultation: Heart Failure   HPI:   [name] is a 70 y.o. 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 Patient is not a Coumadin candidate secondary to history of alcohol abuse. He was admitted 04/2014 with acute on chronic diastolic CHF and developed cardiorenal syndrome requiring temporary hemodialysis. 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 Myoview 05/2014 was neg for ischemia. He has a hx of breast CA and underwent mastectomy.   Yesterday he was evaluated in the HF clinic and sent to the ED for further evaluation due to hypoxia, lethargy, and volume overload. He was at [facility] and over the last several days diuretics were increased but his weight continued to rise. O2 sats on arrival to HF clinic was 79% on 3 liters O2. Admitted for further observation. Started vanc/zosyn for possible pneumonia. Diuresed with 40 mg IV lasix. Negative 1.9 liters. Started on lactulose. O2 sats remain marginal on 3 liters-->88%  Today he is complaining of fatigue and mild dyspnea.    Studies: - LHC (11/01): 3 vessel CAD >>> CABG - Nuclear (9/14): Inferolateral, no ischemia, not gated, probable low risk scan >>> med rx - Carotid US (5/15): Bilateral ICA 60-79%, >50% RECA- f/u 9-months - Echo (7/15): EF 50-55%, MAC, moderate to severe LAE, moderate RVH, moderate RAE - Nuclear (8/15): Normal, EF 63% - Carotid US (6/16): Bilateral ICA 40-59%; > 50% bilat ECA, L VA occluded, FU 1 year - Echo (06/12/15): EF 60-65%, no RWMA, mild AS (mean 12 mmHg), MAC, mod LAE, mod RAE - Venous Duplex (07/03/15): Neg for DVT bilaterally  - Echo 08/16/15 : EF 55-60%, Mild AS, RV moderately dilated,  Mild/moderate reduction in RV function. Severe RAE. PA peak pressure 82    Social History: Prior to admit--SNF . No smoking or alcohol.  Family History: The patient's family history includes CVA in his father and mother; Cancer in his mother; Stomach cancer (age of onset: 54) in his brother. There is no history of Heart attack.     Review of Systems: [y] = yes, [ ]  = no   General: Weight gain [Y ]; Weight loss [ ] ; Anorexia [ ] ; Fatigue [Y ]; Fever [ ] ; Chills [ ] ; Weakness [ Y]  Cardiac: Chest pain/pressure [ ] ; Resting SOB [ ] ; Exertional SOB [ Y]; Orthopnea [ Y]; Pedal Edema [ ] ; Palpitations [ ] ; Syncope [ ] ; Presyncope [ ] ; Paroxysmal nocturnal dyspnea[ ]   Pulmonary: Cough [Y ]; Wheezing[ ] ; Hemoptysis[ ] ; Sputum [ ] ; Snoring [ ]   GI: Vomiting[ ] ; Dysphagia[ ] ; Melena[ ] ; Hematochezia [ ] ; Heartburn[ ] ; Abdominal pain [ ] ; Constipation [ ] ; Diarrhea [ ] ; BRBPR [ ]   GU: Hematuria[ ] ; Dysuria [ ] ; Nocturia[ ]   Vascular: Pain in legs with walking [ ] ; Pain in feet with lying flat [ ] ; Non-healing sores [ ] ; Stroke [ Y]; TIA [ ] ; Slurred speech [ ] ;  Neuro: Headaches[ ] ; Vertigo[ ] ; Seizures[ ] ; Paresthesias[ ] ;Blurred vision [ ] ; Diplopia [ ] ; Vision changes [ ]   Ortho/Skin: Arthritis [ ] ; Joint pain [Y ]; Muscle pain [ ] ; Joint swelling [ ] ; Back Pain [ ] ; Rash [ ]   Psych: Depression[ ] ; Anxiety[ ]   Heme: Bleeding problems [ ] ;  Clotting disorders [ ] ; Anemia [ ]   Endocrine: Diabetes [Y ]; Thyroid dysfunction[ ]   Home Medications Prior to Admission medications   Medication Sig Start Date End Date Taking? Authorizing Provider  albuterol (PROVENTIL) (2.5 MG/3ML) 0.083% nebulizer solution Take 3 mLs (2.5 mg total) by nebulization every 2 (two) hours as needed for wheezing or shortness of breath. 08/22/15  Yes Shanker Kristeen Mans, MD  allopurinol (ZYLOPRIM) 300 MG tablet Take 300 mg by mouth daily.   Yes Historical Provider, MD  aspirin EC 325 MG EC tablet Take 1 tablet (325 mg total) by  mouth daily. 08/02/14  Yes Daniel J Angiulli, PA-C  atorvastatin (LIPITOR) 20 MG tablet Take 1 tablet (20 mg total) by mouth daily. 08/02/14  Yes Daniel J Angiulli, PA-C  carvedilol (COREG) 3.125 MG tablet Take 2 tablets (6.25 mg total) by mouth 2 (two) times daily with a meal. 07/07/15  Yes Bonnielee Haff, MD  cetirizine (ZYRTEC) 10 MG tablet Take 10 mg by mouth daily.   Yes Historical Provider, MD  colchicine 0.6 MG tablet Take 0.6 mg by mouth daily. 01/13/15  Yes Historical Provider, MD  fluticasone (FLOVENT HFA) 110 MCG/ACT inhaler Inhale 1 puff into the lungs 2 (two) times daily.    Yes Historical Provider, MD  folic acid (FOLVITE) 1 MG tablet Take 1 tablet (1 mg total) by mouth daily. 08/02/14  Yes Daniel J Angiulli, PA-C  gabapentin (NEURONTIN) 300 MG capsule Take 1 capsule (300 mg total) by mouth 2 (two) times daily. 08/02/14  Yes Daniel J Angiulli, PA-C  guaiFENesin (MUCINEX) 600 MG 12 hr tablet Take 600 mg by mouth 2 (two) times daily.   Yes Historical Provider, MD  hydrALAZINE (APRESOLINE) 100 MG tablet Take 1 tablet (100 mg total) by mouth every 8 (eight) hours. 08/22/15  Yes Shanker Kristeen Mans, MD  insulin aspart (NOVOLOG) 100 UNIT/ML injection 0-9 Units, Subcutaneous, 3 times daily with meals, First dose on Tue 08/12/15 at 0800 Correction coverage: Sensitive (thin, NPO, renal) CBG < 70: implement hypoglycemia protocol CBG 70 - 120: 0 units CBG 121 - 150: 1 unit CBG 151 - 200: 2 units CBG 201 - 250: 3 units CBG 251 - 300: 5 units CBG 301 - 350: 7 units CBG 351 - 400: 9 units CBG > 400: call MD 08/22/15  Yes Shanker Kristeen Mans, MD  Insulin Glargine (LANTUS SOLOSTAR) 100 UNIT/ML Solostar Pen Inject 6 Units into the skin daily at 10 pm.   Yes Historical Provider, MD  ipratropium-albuterol (DUONEB) 0.5-2.5 (3) MG/3ML SOLN Take 3 mLs by nebulization 2 (two) times daily. 08/22/15  Yes Shanker Kristeen Mans, MD  isosorbide dinitrate (ISORDIL) 10 MG tablet Take 1 tablet (10 mg total) by mouth 3  (three) times daily. 08/22/15  Yes Shanker Kristeen Mans, MD  levothyroxine (SYNTHROID, LEVOTHROID) 125 MCG tablet Take 1.5 tablets (187 mcg total) by mouth daily before breakfast. 08/02/14  Yes Daniel J Angiulli, PA-C  magnesium oxide (MAG-OX) 400 (241.3 MG) MG tablet Take 1 tablet (400 mg total) by mouth 2 (two) times daily. 06/26/15  Yes Scott Joylene Draft, PA-C  metolazone (ZAROXOLYN) 5 MG tablet Take 5 mg by mouth every evening.   Yes Historical Provider, MD  omeprazole (PRILOSEC) 20 MG capsule Take 1 capsule (20 mg total) by mouth daily. 08/02/14  Yes Daniel J Angiulli, PA-C  Oxycodone HCl 10 MG TABS Take 1 tablet (10 mg total) by mouth 3 (three) times daily as needed (pain). 08/22/15  Yes Shanker Kristeen Mans, MD  potassium chloride SA (KLOR-CON M20) 20 MEQ tablet Take 1 tablet (20 mEq total) by mouth 2 (two) times daily. 08/22/15  Yes Shanker Kristeen Mans, MD  saccharomyces boulardii (FLORASTOR) 250 MG capsule Take 250 mg by mouth 2 (two) times daily.   Yes Historical Provider, MD  sildenafil (REVATIO) 20 MG tablet Take 1 tablet (20 mg total) by mouth 3 (three) times daily. 08/22/15  Yes Shanker Kristeen Mans, MD  tamoxifen (NOLVADEX) 20 MG tablet Take 1 tablet (20 mg total) by mouth daily. 09/09/14  Yes Nicholas Lose, MD  torsemide (DEMADEX) 20 MG tablet Take 80 mg by mouth 2 (two) times daily.   Yes Historical Provider, MD  venlafaxine XR (EFFEXOR-XR) 75 MG 24 hr capsule Take 225 mg by mouth daily with breakfast.    Yes Historical Provider, MD    Past Medical History: Past Medical History  Diagnosis Date  . CHRONIC OBSTRUCTIVE PULMONARY DISEASE 06/20/2009  . CAROTID STENOSIS 06/20/2009    A. 08/2001 s/p L CEA;  B.   09/14/11 - Carotid U/S - 40-59% bilateral stenosis, left CEA patch angioplasty is patent  . DM 06/20/2009  . CAD 06/20/2009    A.  08/2000 - s/p CABG x 4 - LIMA-LAD, Left Radial-OM, VG-DIAG, VG-RCA;  B. Neg. MV  2010  . HYPERLIPIDEMIA 06/20/2009  . HYPERTENSION 06/20/2009  . Hypothyroidism   .  Low back pain   . Asthma     as child  . Pneumonia   . Persistent atrial fibrillation (HCC)     Not felt to be coumadin candidate 2/2 ETOH use.  Marland Kitchen History of tobacco abuse     remote - quit 1970  . Morbidly obese ([place])   . Chronic diastolic heart failure, NYHA class 3 ([place])     Followed by CHF Clinic --> Echo 04/2014: EF 99991111, Diastolic DysFxn - elevated LVEDP & LAP. Mod Dil LA & RA.  . Falls frequently   . Hx of cardiovascular stress test 05/2014    Lexiscan Myoview (8/15):  No ischemia; EF 63% - Normal Study  . Breast cancer, right breast ([place])   . Gout   . OBSTRUCTIVE SLEEP APNEA 06/20/2009    does not use cpap  . Stroke [facility])     found on MRI in Oct. 2015.   Marland Kitchen Shortness of breath dyspnea     occasional  . Depression     takes Prozac  . GERD (gastroesophageal reflux disease)     takes Prilosec  . Arthritis   . Neuromuscular disorder (HCC)     neuropathy in both legs  . Constipation   . ETOH abuse     no alcohol since Oct. 2015  . 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 CHF (congestive heart failure) ([place])   . Supplemental oxygen dependent     hs  . Bilateral renal cysts   . Kidney disease     Stage 3  . Pulmonary HTN ([place])     a. [place] 11/16: severe pulmonary HTN, PASP 57, PA sat 42%, Ao sat 90%, CO 4.2 L/min, CI 1.88, PCWP mean 24, RA mean 14    Past Surgical History: Past Surgical History  Procedure Laterality Date  . Carotid endarterectomy  2002    left  . Coronary artery bypass graft      x 4 - 2001  . Umbilical hernia repair N/A 01/28/2014    Procedure: HERNIA REPAIR UMBILICAL ADULT/INC;  Surgeon: Harl Bowie, MD;  Location: [place];  Service: General;  Laterality: N/A;  . Laparotomy N/A 01/28/2014    Procedure: EXPLORATORY LAPAROTOMY;  Surgeon: Harl Bowie, MD;  Location: [place];  Service: General;  Laterality: N/A;  . Bowel resection N/A 01/28/2014    Procedure: SMALL BOWEL RESECTION;  Surgeon:  Harl Bowie, MD;  Location: [place];  Service: General;  Laterality: N/A;  . Hernia repair    . Colonoscopy    . Total mastectomy Right 11/11/2014    Procedure: RIGHT TOTAL MASTECTOMY;  Surgeon: Excell Seltzer, MD;  Location: [place];  Service: General;  Laterality: Right;  . Cataract extraction w/phaco Left 04/08/2015    Procedure: CATARACT EXTRACTION PHACO AND INTRAOCULAR LENS PLACEMENT ([place]);  Surgeon: Birder Robson, MD;  Location: [facility];  Service: Ophthalmology;  Laterality: Left;  [place] 00:36 AP% 23.7 CDE 8.72 fluid pack [id number] H  . Eye surgery Left     Catarct  . Laparotomy N/A 06/02/2015    Procedure: EXPLORATORY LAPAROTOMY;  Surgeon: Coralie Keens, MD;  Location: [place];  Service: General;  Laterality: N/A;  . Bowel resection N/A 06/02/2015    Procedure: SMALL BOWEL RESECTION;  Surgeon: Coralie Keens, MD;  Location: [place];  Service: General;  Laterality: N/A;  . Cardiac catheterization N/A 08/15/2015    Procedure: Right Heart Cath;  Surgeon: Jettie Booze, MD;  Location: [place] CV [facility];  Service: Cardiovascular;  Laterality: N/A;    Family History: Family History  Problem Relation Age of Onset  . CVA Mother     ?  Marland Kitchen Cancer Mother     unknown type of cancer  . CVA Father     ?  Marland Kitchen Heart attack Neg Hx   . Stomach cancer Brother 72    stomach cancer    Social History: Social History   Social History  . Marital Status: Single    Spouse Name: N/A  . Number of Children: N/A  . Years of Education: N/A   Occupational History  . retired    Social History Main Topics  . Smoking status: Former Smoker -- 1.00 packs/day for 16 years    Quit date: 10/11/1968  . Smokeless tobacco: Never Used  . Alcohol Use: Yes     Comment: last drink was 2 weeks - 8?16  . Drug Use: Yes    Special: Marijuana     Comment: last 1 year ago  . Sexual Activity: No   Other Topics Concern  . Not on file   Social History Narrative   Lives in [place] with dtr,  son-in-law    Allergies:  Allergies  Allergen Reactions  . Erythromycin Hives    Oramycin    Objective:    Vital Signs:   Temp:  [97.8 F (36.6 C)-98.7 F (37.1 C)] 98.7 F (37.1 C) (01/06 0357) Pulse Rate:  [68-87] 73 (01/06 0400) Resp:  [15-23] 21 (01/06 0000) BP: (108-135)/(58-77) 129/74 mmHg (01/06 0400) SpO2:  [92 %-100 %] 92 % (01/06 0400) Weight:  [256 lb 4.8 oz (116.257 kg)-262 lb 3.2 oz PB:[id number] kg)] 256 lb 4.8 oz (116.257 kg) (01/06 0700)    Weight change: Filed Weights   10/16/15 1755 10/17/15 0700  Weight: 262 lb 3.2 oz (118.933 kg) 256 lb 4.8 oz (116.257 kg)    Intake/Output:   Intake/Output Summary (Last 24 hours) at 10/17/15 0751 Last data filed at 10/17/15 0201  Gross per 24 hour  Intake    240 ml  Output   2200 ml  Net  -1960 ml     PHYSICAL EXAM: General: Elderly and chronically ill appearing. In bed HEENT: Normal  Neck: supple. JVP difficult to see but does not appear elevated. Carotids 2+ bilat; no bruits. No thyromegaly noted. Cor: PMI nondisplaced. Irregular. No murmur, gallop, or rub noted.  Lungs: RML RLL LLL decreased with crackles noted. On 6 liters oxygen.  Abdomen: Obese, soft, nontender, nondistended, no HSM. No bruits or masses. +BS Extremities: no cyanosis, clubbing, rash. R and LLE with compression wraps. R and L hand asterixis noted. .  Neuro: Drowsy but arousable. Orientedx3, cranial nerves grossly intact. moves all 4 extremities w/o difficulty. Affect pleasant  Telemetry: A fib 90s   Labs: Basic Metabolic Panel:  Recent Labs Lab 10/16/15 1330 10/17/15 0524  NA 139 141  K 3.5 2.8*  CL 90* 90*  CO2 42* 42*  GLUCOSE 143* 129*  BUN 54* 52*  CREATININE 1.55* 1.44*  CALCIUM 9.0 9.0    Liver Function Tests:  Recent Labs Lab 10/16/15 1330  AST 16  ALT 8*  ALKPHOS 56  BILITOT 0.5  PROT 6.6  ALBUMIN 2.8*   No results for input(s): LIPASE, AMYLASE in the last 168 hours.  Recent Labs Lab 10/16/15 1330   AMMONIA 45*    CBC:  Recent Labs Lab 10/16/15 1330 10/17/15 0524  WBC 5.4 5.6  NEUTROABS 3.6  --   HGB 9.5* 10.0*  HCT 32.5* 35.3*  MCV 101.9* 101.1*  PLT 125* 139*    Cardiac Enzymes: No results for input(s): CKTOTAL, CKMB, CKMBINDEX, TROPONINI in the last 168 hours.  BNP: BNP (last 3 results)  Recent Labs  09/03/15 1604 09/25/15 1245 10/16/15 1330  BNP 395.2* 365.1* 851.3*    ProBNP (last 3 results) No results for input(s): PROBNP in the last 8760 hours.   CBG:  Recent Labs Lab 10/16/15 2000  GLUCAP 125*    Coagulation Studies: No results for input(s): LABPROT, INR in the last 72 hours.  Other results: EKG: Imaging: Dg Chest 2 View  10/16/2015  CLINICAL DATA:  Hypoxia for several days. Confusion. History of breast carcinoma EXAM: CHEST  2 VIEW COMPARISON:  August 15, 2015 FINDINGS: There is patchy airspace consolidation in both lung bases. There are small pleural effusions as well. The heart is mildly enlarged with pulmonary vascularity within normal limits. Patient is status post coronary artery bypass grafting. No adenopathy. There is degenerative change in the thoracic spine. IMPRESSION: Bibasilar airspace consolidation. Suspect pneumonia, although alveolar edema could present in this manner. There may be both congestive heart failure and pneumonia present. Note that there are small pleural effusions bilaterally with cardiomegaly. Electronically Signed   By: Lowella Grip III M.D.   On: 10/16/2015 14:34      Medications:     Current Medications: . antiseptic oral rinse  7 mL Mouth Rinse BID  . aspirin EC  325 mg Oral Daily  . atorvastatin  20 mg Oral QHS  . carvedilol  6.25 mg Oral BID WC  . enoxaparin (LOVENOX) injection  30 mg Subcutaneous Q24H  . folic acid  1 mg Oral Daily  . furosemide  40 mg Intravenous TID  . gabapentin  300 mg Oral BID  . insulin aspart  0-5 Units Subcutaneous QHS  . insulin aspart  0-9 Units Subcutaneous TID WC   . insulin glargine  6 Units Subcutaneous QHS  . ipratropium-albuterol  3 mL Nebulization BID  . isosorbide dinitrate  10 mg  Oral TID  . lactulose  20 g Oral BID  . levothyroxine  187 mcg Oral QAC breakfast  . pantoprazole  40 mg Oral Daily  . piperacillin-tazobactam (ZOSYN)  IV  3.375 g Intravenous 3 times per day  . sodium chloride  3 mL Intravenous Q12H  . tamoxifen  20 mg Oral Daily  . vancomycin  1,250 mg Intravenous Q24H  . venlafaxine XR  225 mg Oral Q breakfast     Infusions:      Assessment:  1. A/C Diastolic Heart Failure 2. Acute Respiratory Failure with Hypercarbia 3. H/O Cirrohsis with hepatic encephalopathy--> Elevated ammonia 4. R Pleural Effusion 5. PAH 6. A fib -Chronic 7. CKD 8. H/O Breast Cancer S/P mastectomy 9. Hypokalemia 10. Acute delirium/lethargy    Plan/Discussion:    Admitted with acute respiratory failure with hypercarbia, acute diastolic heart failure, and lethargy. Remains lethargic today and requiring increased oxygenation. 3L>6L. O2 sats 88%.    Volume status remains elevated. Continue IV lasix 40 mg tid. Add diamox 500 mg twice x 3 days.CO2 42. Diamox has worked well in the past.  Supplement K. Add 12.5 mg hydralazine. No nitrate with PDE5. Stop carvedilol and switch to bisoprolol 2.5 mg daily due to ongoing pulmonary issue. Place PICC to help guide therapy and optimize diuresis. Renal function ok. Place foley.   Suspected pneumonia- Continue Vanc and Zosyn. Afebrile. WBC low.   Ammonia on admit 45. Continue lactulose.  Remains in A fib. Rate controlled. No anticoagulant due to history of falls.    Would benefit from [facility]. ? Aspiration.   I spoke to his daughter yesterday about code status and she is requesting full code. Needs palliative care.   Length of Stay: 1  Amy Clegg NP-C  10/17/2015, 7:51 AM  Advanced Heart Failure Team Pager [phone] (M-F; 7a - 4p)  Please contact [facility] for night-coverage after  hours (4p -7a ) and weekends on [email or web address]  Patient seen and examined with Darrick Grinder, NP. We discussed all aspects of the encounter. I agree with the assessment and plan as stated above.   He has multiple issues including recurrent volume overload/diastolic HF, possible PNA vs effusion and extreme lethargy likely due in part to hepatic encephalopathy. ABG shows pCO2 in high 60s but is well-compensated so likely chronic issue.   Agree with IV lasix and acetazlomide. Primary team has started lactulose and abx. Once diureses complete will need repeat imaging to further evaluate RLL (non-contrast CT vs CXR).   Has chronic AF and not candidate for anticoagulation.   Family not willing to consider DNR at this time despite severe chronic medical issues.   Bensimhon, Daniel,MD 11:15 AM

## 2015-10-18 DIAGNOSIS — I482 Chronic atrial fibrillation: Secondary | ICD-10-CM

## 2015-10-18 LAB — CBC
HEMATOCRIT: 33.4 % — AB (ref 39.0–52.0)
Hemoglobin: 9.7 g/dL — ABNORMAL LOW (ref 13.0–17.0)
MCH: 29.8 pg (ref 26.0–34.0)
MCHC: 29 g/dL — ABNORMAL LOW (ref 30.0–36.0)
MCV: 102.5 fL — AB (ref 78.0–100.0)
Platelets: 118 10*3/uL — ABNORMAL LOW (ref 150–400)
RBC: 3.26 MIL/uL — AB (ref 4.22–5.81)
RDW: 17.4 % — ABNORMAL HIGH (ref 11.5–15.5)
WBC: 5 10*3/uL (ref 4.0–10.5)

## 2015-10-18 LAB — CARBOXYHEMOGLOBIN
CARBOXYHEMOGLOBIN: 2.8 % — AB (ref 0.5–1.5)
METHEMOGLOBIN: 0.8 % (ref 0.0–1.5)
O2 Saturation: 70.5 %
Total hemoglobin: 10 g/dL — ABNORMAL LOW (ref 13.5–18.0)

## 2015-10-18 LAB — BASIC METABOLIC PANEL
ANION GAP: 8 (ref 5–15)
BUN: 51 mg/dL — ABNORMAL HIGH (ref 6–20)
CALCIUM: 8.9 mg/dL (ref 8.9–10.3)
CO2: 43 mmol/L — ABNORMAL HIGH (ref 22–32)
Chloride: 89 mmol/L — ABNORMAL LOW (ref 101–111)
Creatinine, Ser: 1.47 mg/dL — ABNORMAL HIGH (ref 0.61–1.24)
GFR, EST AFRICAN AMERICAN: 54 mL/min — AB (ref >60)
GFR, EST NON AFRICAN AMERICAN: 46 mL/min — AB (ref >60)
Glucose, Bld: 90 mg/dL (ref 65–99)
Potassium: 3.4 mmol/L — ABNORMAL LOW (ref 3.5–5.1)
Sodium: 140 mmol/L (ref 135–145)

## 2015-10-18 LAB — GLUCOSE, CAPILLARY
GLUCOSE-CAPILLARY: 127 mg/dL — AB (ref 65–99)
GLUCOSE-CAPILLARY: 121 mg/dL — AB (ref 65–99)
Glucose-Capillary: 93 mg/dL (ref 65–99)
Glucose-Capillary: 149 mg/dL — ABNORMAL HIGH (ref 65–99)

## 2015-10-18 LAB — HEMOGLOBIN A1C
Hgb A1c MFr Bld: 7.3 % — ABNORMAL HIGH (ref 4.8–5.6)
Mean Plasma Glucose: 163 mg/dL

## 2015-10-18 LAB — AMMONIA: Ammonia: 53 umol/L — ABNORMAL HIGH (ref 9–35)

## 2015-10-18 MED ORDER — LACTULOSE 10 GM/15ML PO SOLN: 20.0000 g | Freq: Three times a day (TID) | ORAL | Status: DC

## 2015-10-18 MED ORDER — LACTULOSE ENEMA
300.0000 mL | Freq: Once | ORAL | Status: DC
  Filled 2015-10-18: qty 300

## 2015-10-18 MED ORDER — FUROSEMIDE 10 MG/ML IJ SOLN
80.0000 mg | Freq: Three times a day (TID) | INTRAMUSCULAR | Status: AC
  Administered 2015-10-18: 80 mg via INTRAVENOUS
  Filled 2015-10-18: qty 8

## 2015-10-18 MED ORDER — LACTULOSE 10 GM/15ML PO SOLN
30.0000 g | Freq: Three times a day (TID) | ORAL | Status: DC
  Administered 2015-10-18 – 2015-10-22 (×11): 30 g via ORAL
  Filled 2015-10-18 (×11): qty 45

## 2015-10-18 NOTE — Progress Notes (Signed)
Pt scrotum and penis has increasing edema. No complaints. Provided peri care and foley care. Dr. Carles Collet made aware. Requesting inner-dry for skin folds.

## 2015-10-18 NOTE — Progress Notes (Signed)
Advanced Heart Failure Rounding Note   Subjective:    More awake this am. Denies dyspnea. Weight down 2 pounds. CVP 19->16.  Co-ox 71%. Renal function stable.   Made DNR after discussions with Dr. Carles Collet. (Computer still says Full Code though)   Objective:   Weight Range:  Vital Signs:   Temp:  [97.8 F (36.6 C)-99.1 F (37.3 C)] 98 F (36.7 C) (01/07 0353) Pulse Rate:  [71-90] 78 (01/07 0800) Resp:  [17-22] 17 (01/07 0800) BP: (105-125)/(57-72) 121/68 mmHg (01/07 0800) SpO2:  [94 %-99 %] 98 % (01/07 0920) Weight:  [115.4 kg (254 lb 6.6 oz)] 115.4 kg (254 lb 6.6 oz) (01/07 0400) Last BM Date:  (pt does not recall, active BS)  Weight change: Filed Weights   10/16/15 1755 10/17/15 0700 10/18/15 0400  Weight: 118.933 kg (262 lb 3.2 oz) 116.257 kg (256 lb 4.8 oz) 115.4 kg (254 lb 6.6 oz)    Intake/Output:   Intake/Output Summary (Last 24 hours) at 10/18/15 0959 Last data filed at 10/18/15 0900  Gross per 24 hour  Intake 2890.17 ml  Output    750 ml  Net 2140.17 ml     PHYSICAL EXAM: General: Elderly and chronically ill appearing. In bed. More awake HEENT: Normal  Neck: supple. JVP difficult to see but does not appear elevated. Carotids 2+ bilat; no bruits. No thyromegaly noted. Cor: PMI nondisplaced. Irregular. No murmur, gallop, or rub noted.  Lungs: dull at bases R>L  with mild crackles Abdomen: Obese, soft, nontender, + distended, +BS Extremities: no cyanosis, clubbing, rash. R and LLE with compression wraps. 1-2+ edema Neuro: More awake Orientedx3, cranial nerves grossly intact. moves all 4 extremities w/o difficulty. Affect pleasant  Telemetry: A fib 90s   Labs: Basic Metabolic Panel:  Recent Labs Lab 10/16/15 1330 10/17/15 0524 10/17/15 1240 10/18/15 0530  NA 139 141  --  140  K 3.5 2.8*  --  3.4*  CL 90* 90*  --  89*  CO2 42* 42*  --  43*  GLUCOSE 143* 129*  --  90  BUN 54* 52*  --  51*  CREATININE 1.55* 1.44*  --  1.47*  CALCIUM 9.0 9.0   --  8.9  MG  --   --  1.7  --     Liver Function Tests:  Recent Labs Lab 10/16/15 1330  AST 16  ALT 8*  ALKPHOS 56  BILITOT 0.5  PROT 6.6  ALBUMIN 2.8*   No results for input(s): LIPASE, AMYLASE in the last 168 hours.  Recent Labs Lab 10/16/15 1330 10/18/15 0530  AMMONIA 45* 53*    CBC:  Recent Labs Lab 10/16/15 1330 10/17/15 0524 10/18/15 0530  WBC 5.4 5.6 5.0  NEUTROABS 3.6  --   --   HGB 9.5* 10.0* 9.7*  HCT 32.5* 35.3* 33.4*  MCV 101.9* 101.1* 102.5*  PLT 125* 139* 118*    Cardiac Enzymes: No results for input(s): CKTOTAL, CKMB, CKMBINDEX, TROPONINI in the last 168 hours.  BNP: BNP (last 3 results)  Recent Labs  09/03/15 1604 09/25/15 1245 10/16/15 1330  BNP 395.2* 365.1* 851.3*    ProBNP (last 3 results) No results for input(s): PROBNP in the last 8760 hours.    Other results:  Imaging: Dg Chest 2 View  10/16/2015  CLINICAL DATA:  Hypoxia for several days. Confusion. History of breast carcinoma EXAM: CHEST  2 VIEW COMPARISON:  August 15, 2015 FINDINGS: There is patchy airspace consolidation in both lung bases. There  are small pleural effusions as well. The heart is mildly enlarged with pulmonary vascularity within normal limits. Patient is status post coronary artery bypass grafting. No adenopathy. There is degenerative change in the thoracic spine. IMPRESSION: Bibasilar airspace consolidation. Suspect pneumonia, although alveolar edema could present in this manner. There may be both congestive heart failure and pneumonia present. Note that there are small pleural effusions bilaterally with cardiomegaly. Electronically Signed   By: Lowella Grip III M.D.   On: 10/16/2015 14:34   Dg Chest Port 1 View  10/17/2015  CLINICAL DATA:  PICC placement. EXAM: PORTABLE CHEST 1 VIEW COMPARISON:  PA and lateral chest 10/16/2015. FINDINGS: A new right PICC is in place with the tip projecting in the mid to lower superior vena cava. Bibasilar airspace disease  persists without marked change. No pneumothorax. There is cardiomegaly. IMPRESSION: Tip of right PICC projects in good position. No change in bilateral airspace disease. Electronically Signed   By: Inge Rise M.D.   On: 10/17/2015 11:33      Medications:     Scheduled Medications: . acetaZOLAMIDE  500 mg Oral BID  . antiseptic oral rinse  7 mL Mouth Rinse BID  . aspirin EC  325 mg Oral Daily  . atorvastatin  20 mg Oral QHS  . bisoprolol  2.5 mg Oral Daily  . enoxaparin (LOVENOX) injection  60 mg Subcutaneous Q24H  . folic acid  1 mg Oral Daily  . furosemide  40 mg Intravenous TID  . gabapentin  300 mg Oral BID  . hydrALAZINE  12.5 mg Oral TID  . insulin aspart  0-5 Units Subcutaneous QHS  . insulin aspart  0-9 Units Subcutaneous TID WC  . insulin glargine  6 Units Subcutaneous QHS  . ipratropium-albuterol  3 mL Nebulization BID  . lactulose  20 g Oral BID  . levothyroxine  187 mcg Oral QAC breakfast  . pantoprazole  40 mg Oral Daily  . piperacillin-tazobactam (ZOSYN)  IV  3.375 g Intravenous 3 times per day  . potassium chloride  20 mEq Oral BID  . sildenafil  20 mg Oral TID  . sodium chloride  10-40 mL Intracatheter Q12H  . sodium chloride  3 mL Intravenous Q12H  . tamoxifen  20 mg Oral Daily  . vancomycin  1,250 mg Intravenous Q24H  . venlafaxine XR  225 mg Oral Q breakfast     Infusions:     PRN Medications:  sodium chloride, acetaminophen, albuterol, ondansetron (ZOFRAN) IV, oxyCODONE, sodium chloride, sodium chloride   Assessment:   1. A/C Diastolic Heart Failure 2. Acute Respiratory Failure with Hypercarbia 3. H/O Cirrohsis with hepatic encephalopathy--> Elevated ammonia 4. R Pleural Effusion 5. PAH 6. A fib -Chronic 7. CKD 8. H/O Breast Cancer S/P mastectomy 9. Hypokalemia 10. Acute delirium/lethargy 11. DNR  Plan/Discussion:    Improving slowly. Still with significant volume overload. Increase lasix to 80 tid. Continue diamox. Once diuresis  complete will need repeat imaging to reassess right effusion.   AF is chronic. Well rate controlled. Not candidate for [facility].   Now DNR. Which is appropriate given his situation. Will await Hospitalists to change order in computer.   [place]ola Depaz,MD 10:05 AM Advanced Heart Failure Team Pager [phone] (M-F; 7a - 4p)  Please contact [facility] for night-coverage after hours (4p -7a ) and weekends on [email or web address]   Length of Stay: 2

## 2015-10-18 NOTE — NC FL2 (Signed)
[place] MEDICAID FL2 LEVEL OF CARE SCREENING TOOL     IDENTIFICATION  Patient Name: [name] Birthdate: Jun 17, 1946 Sex: male Admission Date (Current Location): 10/16/2015  [facility] and [place] Number:  [employer] and Address:  [facility] [place], [place] [address] [place], [place] [address]      Provider Number: [id number]  Attending Physician Name and Address:  Orson Eva, MD  Relative Name and Phone Number:       Current Level of Care: Hospital Recommended Level of Care: [place] Prior Approval Number:    Date Approved/Denied:   PASRR Number: [id number]  Discharge Plan: SNF    Current Diagnoses: Patient Active Problem List   Diagnosis Date Noted  . Acute on chronic respiratory failure with hypoxia ([place]) 10/17/2015  . Aspiration pneumonia ([place]) 10/17/2015  . Hyperammonemia ([place]) 10/17/2015  . [place] 10/16/2015  . Pleural effusion 10/16/2015  . Fluid overload 10/16/2015  . HCAP (healthcare-associated pneumonia) 10/11/2015  . Dyslipidemia with low high density lipoprotein (HDL) cholesterol with hypertriglyceridemia due to type 2 diabetes mellitus ([place]) 08/25/2015  . Persistent atrial fibrillation ([place])   . Hypoxia   . Pulmonary hypertension ([place])   . ARF (acute renal failure) ([place])   . Acute on chronic diastolic heart failure ([place])   . Faintness   . Stroke (cerebrum) ([place])   . Acute encephalopathy 08/12/2015  . Hypoxemia requiring supplemental oxygen 08/11/2015  . A-fib ([place]) 08/11/2015  . Acute respiratory failure with hypoxia ([place]) 08/11/2015  . Sepsis ([place]) 07/01/2015  . Sepsis due to cellulitis ([place]) 07/01/2015  . SOB (shortness of breath)   . Acute on chronic diastolic CHF (congestive heart failure), NYHA class 3 ([place])   . Encephalopathy, metabolic   . Hypomagnesemia 05/27/2015  . History of male breast cancer, s/p R mastectomy Jan 2016 05/27/2015  . General weakness 05/27/2015  . Generalized weakness  05/26/2015  . Hypokalemia 10/03/2014  . Congestive heart failure ([place]) 10/02/2014  . Lower extremity pain, left 10/02/2014  . Diabetes mellitus type 2, controlled ([place]) 10/02/2014  . Enterocutaneous fistula   . Hypertension 08/30/2014  . COPD (chronic obstructive pulmonary disease) ([place]) 08/30/2014  . Intracerebral hemorrhage ([place]) 08/01/2014  . Osteoarthritis of both hands 07/30/2014  . Thrush, oral 07/28/2014  . Debility 07/25/2014  . Diabetic polyneuropathy associated with type 2 diabetes mellitus ([place]) 07/25/2014  . Gout flare 07/24/2014  . Pre-operative cardiovascular examination 07/15/2014  . Family history of malignant neoplasm of gastrointestinal tract 06/12/2014  . Panniculitis 04/14/2013  . Seasonal allergies 12/20/2012  . L1 vertebral fracture ([place]) 09/07/2012  . Hypothyroidism 08/30/2012  . ETOH abuse   . PAF (paroxysmal atrial fibrillation) ([place])   . Chronic diastolic heart failure ([place])   . DM (diabetes mellitus) type II uncontrolled, periph vascular disorder ([place]) 06/20/2009  . Obstructive sleep apnea 06/20/2009  . CAROTID STENOSIS 06/20/2009  . CHRONIC OBSTRUCTIVE PULMONARY DISEASE 06/20/2009  . Coronary atherosclerosis of native coronary artery 06/20/2009  . Hyperlipidemia   . Hypertensive heart disease     Orientation RESPIRATION BLADDER Height & Weight    Time, Self  O2 (3L)     254 lbs.  BEHAVIORAL SYMPTOMS/MOOD NEUROLOGICAL BOWEL NUTRITION STATUS        Diet (Diet 2 gram sodium)  AMBULATORY STATUS COMMUNICATION OF NEEDS Skin   Extensive Assist Verbally Surgical wounds (Abdomen Incision)  Personal Care Assistance Level of Assistance  Bathing, Dressing Bathing Assistance: Maximum assistance   Dressing Assistance: Maximum assistance     Functional Limitations Info             SPECIAL CARE FACTORS FREQUENCY  PT (By licensed PT), OT (By licensed OT)     PT Frequency: 3 OT Frequency: 3            Contractures  Contractures Info: Not present    Additional Factors Info  Code Status, Allergies Code Status Info: DNR Allergies Info: Erythromycin   Insulin Sliding Scale Info: With meals and bedtime       Current Medications (10/18/2015):  This is the current hospital active medication list Current Facility-Administered Medications  Medication Dose Route Frequency Provider Last Rate Last Dose  . 0.9 %  sodium chloride infusion  250 mL Intravenous PRN Hosie Poisson, MD 10 mL/hr at 10/18/15 0358 250 mL at 10/18/15 0358  . acetaminophen (TYLENOL) tablet 650 mg  650 mg Oral Q4H PRN Hosie Poisson, MD   650 mg at 10/17/15 1712  . acetaZOLAMIDE (DIAMOX) tablet 500 mg  500 mg Oral BID Amy D Clegg, NP   500 mg at 10/18/15 1023  . albuterol (PROVENTIL) (2.5 MG/3ML) 0.083% nebulizer solution 2.5 mg  2.5 mg Nebulization Q2H PRN Hosie Poisson, MD      . antiseptic oral rinse (CPC / CETYLPYRIDINIUM CHLORIDE 0.05%) solution 7 mL  7 mL Mouth Rinse BID Hosie Poisson, MD   7 mL at 10/18/15 1000  . aspirin EC tablet 325 mg  325 mg Oral Daily Hosie Poisson, MD   325 mg at 10/18/15 0902  . atorvastatin (LIPITOR) tablet 20 mg  20 mg Oral QHS Hosie Poisson, MD   20 mg at 10/17/15 2259  . bisoprolol (ZEBETA) tablet 2.5 mg  2.5 mg Oral Daily Amy D Clegg, NP   2.5 mg at 10/18/15 0904  . enoxaparin (LOVENOX) injection 60 mg  60 mg Subcutaneous Q24H Amy D Clegg, NP   60 mg at [id number]  . folic acid (FOLVITE) tablet 1 mg  1 mg Oral Daily Hosie Poisson, MD   1 mg at 10/18/15 0902  . furosemide (LASIX) injection 40 mg  40 mg Intravenous TID Hosie Poisson, MD   40 mg at 10/18/15 0902  . gabapentin (NEURONTIN) capsule 300 mg  300 mg Oral BID Hosie Poisson, MD   300 mg at 10/18/15 0902  . hydrALAZINE (APRESOLINE) tablet 12.5 mg  12.5 mg Oral TID Conrad Waltham, NP   12.5 mg at 10/18/15 0904  . insulin aspart (novoLOG) injection 0-5 Units  0-5 Units Subcutaneous QHS Hosie Poisson, MD   0 Units at 10/16/15 2200  . insulin aspart (novoLOG)  injection 0-9 Units  0-9 Units Subcutaneous TID WC Hosie Poisson, MD   1 Units at 10/18/15 1200  . insulin glargine (LANTUS) injection 6 Units  6 Units Subcutaneous QHS Hosie Poisson, MD   6 Units at 10/17/15 2245  . ipratropium-albuterol (DUONEB) 0.5-2.5 (3) MG/3ML nebulizer solution 3 mL  3 mL Nebulization BID Hosie Poisson, MD   3 mL at 10/18/15 0919  . lactulose (CHRONULAC) 10 GM/15ML solution 20 g  20 g Oral BID Hosie Poisson, MD   20 g at 10/18/15 0853  . levothyroxine (SYNTHROID, LEVOTHROID) tablet 187 mcg  187 mcg Oral QAC breakfast Hosie Poisson, MD   187 mcg at 10/18/15 0902  . ondansetron (ZOFRAN) injection 4 mg  4 mg Intravenous Q6H PRN Jeoffrey Massed  Karleen Hampshire, MD      . oxyCODONE (Oxy IR/ROXICODONE) immediate release tablet 10 mg  10 mg Oral TID PRN Hosie Poisson, MD   10 mg at 10/18/15 1242  . pantoprazole (PROTONIX) EC tablet 40 mg  40 mg Oral Daily Hosie Poisson, MD   40 mg at 10/18/15 0902  . piperacillin-tazobactam (ZOSYN) IVPB 3.375 g  3.375 g Intravenous 3 times per day Lyndee Leo, RPH   3.375 g at 10/18/15 1231  . potassium chloride 20 MEQ/15ML (10%) solution 20 mEq  20 mEq Oral BID Amy D Clegg, NP   20 mEq at 10/18/15 0854  . sildenafil (REVATIO) tablet 20 mg  20 mg Oral TID Conrad Philo, NP   20 mg at 10/18/15 0902  . sodium chloride 0.9 % injection 10-40 mL  10-40 mL Intracatheter Q12H Orson Eva, MD   10 mL at 10/18/15 1000  . sodium chloride 0.9 % injection 10-40 mL  10-40 mL Intracatheter PRN Shanon Brow Tat, MD      . sodium chloride 0.9 % injection 3 mL  3 mL Intravenous Q12H Hosie Poisson, MD   3 mL at 10/18/15 1000  . sodium chloride 0.9 % injection 3 mL  3 mL Intravenous PRN Hosie Poisson, MD      . tamoxifen (NOLVADEX) tablet 20 mg  20 mg Oral Daily Hosie Poisson, MD   20 mg at 10/18/15 1023  . vancomycin (VANCOCIN) 1,250 mg in sodium chloride 0.9 % 250 mL IVPB  1,250 mg Intravenous Q24H Lyndee Leo, RPH   1,250 mg at 10/17/15 2136  . venlafaxine XR (EFFEXOR-XR) 24 hr capsule 225 mg  225  mg Oral Q breakfast Hosie Poisson, MD   225 mg at 10/18/15 1023     Discharge Medications: Please see discharge summary for a list of discharge medications.  Relevant Imaging Results:  Relevant Lab Results:   Additional [place], [place]

## 2015-10-18 NOTE — Progress Notes (Signed)
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 The patient was seen at the heart failure clinic on 10/16/2015 for worsening lower extremity edema. He was noted to be lethargic with oxygen saturation of 79%. He was sent to the emergency department for further evaluation. Assessment/Plan: Acute on chronic diastolic CHF/cor pulmonale-right heart failure -The patient was previously admitted July 2015 with acute on chronic diastolic CHF and developed cardiorenal syndrome requiring temporary dialysis -August 2015 Myoview negative for ischemia -At skilled nursing facility, patient's torsemide was increased to 80 mg twice a day on 09/26/2015 -Metolazone was given 1/2, 1/3, 1/4 -08/22/2015 discharge weight 224 pounds -10/17/2015 weight 262 -Increase intravenous furosemide to 80 mg IV per cardiology -08/16/2015 echo EF 55-60%, PAP 82 -continue sildafenil Acute on chronic respiratory failure with hypoxia and hypercarbia -Multifactorial including pulmonary hypertension CHF, COPD, possible pneumonia, and likely underlying OHS/OSA -continue sildafenil -wean oxygen to keep saturation 90-94% TO AVOID HYPERCARBIA -stable on 2 to 2.5L [place]  Pulmonary infiltrates/consolidation -Concern about possible aspiration--pt likely chronically aspirating  -Chek procalcitonin--<0.10 -speech therapy has been consulted-->regular diet with thin -d/c vancomycin -continue zosyn  Acute encephalopathy -Multifactorial including hyperammonemia, hypercarbia, hypoxia, pneumonia -Last used alcohol greater than 4  months ago -Suspect underlying cognitive impairment -check B12 -TSH  Hyperammonemia -Likely due to chronic hepatic congestion secondary to his pulmonary hypertension -08/12/2015 ammonia 18 -10/16/2015 ammonia 45 -Increase lactulose to 30 grams every 8 hours  Chronic Atrial fibrillation -Not felt to be a candidate for anticoagulation secondary to poor compliance and continued alcohol use -Rate controlled -Continue bisoprolol -CHADSVASc = 6 -Continue aspirin  Thrombocytopenia -Likely due to underlying cirrhosis/chronic hepatic congestion -Chronic -Monitor for objective signs of bleeding  Diabetes mellitus type 2  -08/17/2015 hemoglobin A1c 6.8  -Previous problems with hypoglycemia on oral agents  -NovoLog sliding scale  -CBGs controlled -10/18/15--A1c--7.3  CAD status post CABG:  -Continue aspirin, statins and beta blockers  Hypertension -Continue bisoprolol, hydralazine, Imdur  History of right breast cancer: Status post mastectomy, continue tamoxifen.Follows with Dr Lindi Adie History of enterocutaneous fistula:  -Has post exploratory laparotomy and bowel resection in August 2016. Chronic small ulceration in the anterior abdominal wall that appears stable History of stroke  -October 2015  -continue ASA  Goals of Care -long discussion with daughter at [facility] with DNR but continue aggressive tx for now   Family Communication: Updated daughter on phone Disposition Plan: Home when medically stable    Procedures/Studies: Dg Chest 2 View  10/16/2015  CLINICAL DATA:  Hypoxia for several days. Confusion. History of breast carcinoma EXAM: CHEST  2 VIEW COMPARISON:  August 15, 2015 FINDINGS: There is patchy airspace consolidation in both lung bases. There are small pleural effusions as well. The heart is mildly enlarged with pulmonary vascularity within normal limits. Patient is status post coronary artery bypass grafting. No adenopathy. There is degenerative  change in the thoracic spine. IMPRESSION: Bibasilar airspace consolidation. Suspect pneumonia, although alveolar edema could present in this manner. There may be both congestive heart failure and pneumonia present. Note that there are small pleural effusions bilaterally with cardiomegaly. Electronically Signed   By: Gwyndolyn Saxon  Jasmine December III M.D.   On: 10/16/2015 14:34   Dg Chest Port 1 View  10/17/2015  CLINICAL DATA:  PICC placement. EXAM: PORTABLE CHEST 1 VIEW COMPARISON:  PA and lateral chest 10/16/2015. FINDINGS: A new right PICC is in place with the tip projecting in the mid to lower superior vena cava. Bibasilar airspace disease persists without marked change. No pneumothorax. There is cardiomegaly. IMPRESSION: Tip of right PICC projects in good position. No change in bilateral airspace disease. Electronically Signed   By: Inge Rise M.D.   On: 10/17/2015 11:33         Subjective: Pt remains somnolent but arouses to voice.  Denies HA, f/c, cp, sob.  No reports of respiratory distress or vomiting.  No BM documented in past 24 hours  Objective: Filed Vitals:   10/18/15 0920 10/18/15 1200 10/18/15 1242 10/18/15 1300  BP:  125/74    Pulse:  72  69  Temp:   98.4 F (36.9 C)   TempSrc:   Oral   Resp:  16  12  Height:      Weight:      SpO2: 98% 91%  97%    Intake/Output Summary (Last 24 hours) at 10/18/15 1420 Last data filed at 10/18/15 1231  Gross per 24 hour  Intake 2285.67 ml  Output    750 ml  Net 1535.67 ml   Weight change: -3.533 kg (-7 lb 12.6 oz) Exam:   General:  Pt is alert, follows commands appropriately, not in acute distress  HEENT: No icterus, No thrush, No neck mass, [place]/AT  Cardiovascular: RRR, S1/S2, no rubs, no gallops  Respiratory: bibasilar crackles without wheeze  Abdomen: Soft/+BS, non tender, non distended, no guarding  Extremities: 2+ edema, No lymphangitis, No petechiae, No rashes, no synovitis; no clubbing or cyanosis  Data  Reviewed: Basic Metabolic Panel:  Recent Labs Lab 10/16/15 1330 10/17/15 0524 10/17/15 1240 10/18/15 0530  NA 139 141  --  140  K 3.5 2.8*  --  3.4*  CL 90* 90*  --  89*  CO2 42* 42*  --  43*  GLUCOSE 143* 129*  --  90  BUN 54* 52*  --  51*  CREATININE 1.55* 1.44*  --  1.47*  CALCIUM 9.0 9.0  --  8.9  MG  --   --  1.7  --    Liver Function Tests:  Recent Labs Lab 10/16/15 1330  AST 16  ALT 8*  ALKPHOS 56  BILITOT 0.5  PROT 6.6  ALBUMIN 2.8*   No results for input(s): LIPASE, AMYLASE in the last 168 hours.  Recent Labs Lab 10/16/15 1330 10/18/15 0530  AMMONIA 45* 53*   CBC:  Recent Labs Lab 10/16/15 1330 10/17/15 0524 10/18/15 0530  WBC 5.4 5.6 5.0  NEUTROABS 3.6  --   --   HGB 9.5* 10.0* 9.7*  HCT 32.5* 35.3* 33.4*  MCV 101.9* 101.1* 102.5*  PLT 125* 139* 118*   Cardiac Enzymes: No results for input(s): CKTOTAL, CKMB, CKMBINDEX, TROPONINI in the last 168 hours. BNP: Invalid input(s): POCBNP CBG:  Recent Labs Lab 10/17/15 1157 10/17/15 1642 10/17/15 2114 10/18/15 0732 10/18/15 1131  GLUCAP 130* 149* 116* 93 127*    Recent Results (from the past 240 hour(s))  Blood Culture (routine x 2)     Status: None (Preliminary result)   Collection Time: 10/16/15  1:30 PM  Result Value Ref Range Status   Specimen Description BLOOD LEFT FOREARM  Final   Special Requests  BOTTLES DRAWN AEROBIC AND ANAEROBIC 4CC  Final   Culture NO GROWTH 2 DAYS  Final   Report Status PENDING  Incomplete  Urine culture     Status: None (Preliminary result)   Collection Time: 10/16/15  2:35 PM  Result Value Ref Range Status   Specimen Description URINE, RANDOM  Final   Special Requests NONE  Final   Culture 40,000 COLONIES/ml ESCHERICHIA COLI  Final   Report Status PENDING  Incomplete     Scheduled Meds: . acetaZOLAMIDE  500 mg Oral BID  . antiseptic oral rinse  7 mL Mouth Rinse BID  . aspirin EC  325 mg Oral Daily  . atorvastatin  20 mg Oral QHS  . bisoprolol   2.5 mg Oral Daily  . enoxaparin (LOVENOX) injection  60 mg Subcutaneous Q24H  . folic acid  1 mg Oral Daily  . furosemide  80 mg Intravenous TID  . gabapentin  300 mg Oral BID  . hydrALAZINE  12.5 mg Oral TID  . insulin aspart  0-5 Units Subcutaneous QHS  . insulin aspart  0-9 Units Subcutaneous TID WC  . insulin glargine  6 Units Subcutaneous QHS  . ipratropium-albuterol  3 mL Nebulization BID  . lactulose  20 g Oral TID  . levothyroxine  187 mcg Oral QAC breakfast  . pantoprazole  40 mg Oral Daily  . piperacillin-tazobactam (ZOSYN)  IV  3.375 g Intravenous 3 times per day  . potassium chloride  20 mEq Oral BID  . sildenafil  20 mg Oral TID  . sodium chloride  10-40 mL Intracatheter Q12H  . sodium chloride  3 mL Intravenous Q12H  . tamoxifen  20 mg Oral Daily  . venlafaxine XR  225 mg Oral Q breakfast   Continuous Infusions:    Janya Eveland, DO  [facility] Pager [phone]  If 7PM-7AM, please contact night-coverage [email or web address] Password [email or web address] 10/18/2015, 2:20 PM   LOS: 2 days

## 2015-10-18 NOTE — Plan of Care (Signed)
Problem: Pain Managment: Goal: General experience of comfort will improve Outcome: Progressing Turning pt q2 hrs, elevating heals, repositioning/medicating when experiencing back pain. Assisting pt with food and drink.

## 2015-10-19 DIAGNOSIS — J189 Pneumonia, unspecified organism: Secondary | ICD-10-CM

## 2015-10-19 LAB — URINE CULTURE: Culture: 40000

## 2015-10-19 LAB — BASIC METABOLIC PANEL
ANION GAP: 13 (ref 5–15)
BUN: 54 mg/dL — AB (ref 6–20)
CO2: 40 mmol/L — AB (ref 22–32)
Calcium: 8.9 mg/dL (ref 8.9–10.3)
Chloride: 87 mmol/L — ABNORMAL LOW (ref 101–111)
Creatinine, Ser: 1.8 mg/dL — ABNORMAL HIGH (ref 0.61–1.24)
GFR calc Af Amer: 42 mL/min — ABNORMAL LOW (ref >60)
GFR calc non Af Amer: 36 mL/min — ABNORMAL LOW (ref >60)
GLUCOSE: 130 mg/dL — AB (ref 65–99)
POTASSIUM: 3.3 mmol/L — AB (ref 3.5–5.1)
Sodium: 140 mmol/L (ref 135–145)

## 2015-10-19 LAB — CARBOXYHEMOGLOBIN
Carboxyhemoglobin: 2.9 % — ABNORMAL HIGH (ref 0.5–1.5)
Methemoglobin: 0.7 % (ref 0.0–1.5)
O2 SAT: 93.3 %
TOTAL HEMOGLOBIN: 9.5 g/dL — AB (ref 13.5–18.0)

## 2015-10-19 LAB — CBC
HCT: 35.9 % — ABNORMAL LOW (ref 39.0–52.0)
HEMOGLOBIN: 9.9 g/dL — AB (ref 13.0–17.0)
MCH: 28.8 pg (ref 26.0–34.0)
MCHC: 27.6 g/dL — AB (ref 30.0–36.0)
MCV: 104.4 fL — ABNORMAL HIGH (ref 78.0–100.0)
PLATELETS: 107 10*3/uL — AB (ref 150–400)
RBC: 3.44 MIL/uL — AB (ref 4.22–5.81)
RDW: 17.3 % — ABNORMAL HIGH (ref 11.5–15.5)
WBC: 5.9 10*3/uL (ref 4.0–10.5)

## 2015-10-19 LAB — GLUCOSE, CAPILLARY
GLUCOSE-CAPILLARY: 102 mg/dL — AB (ref 65–99)
GLUCOSE-CAPILLARY: 135 mg/dL — AB (ref 65–99)
Glucose-Capillary: 112 mg/dL — ABNORMAL HIGH (ref 65–99)
Glucose-Capillary: 146 mg/dL — ABNORMAL HIGH (ref 65–99)

## 2015-10-19 LAB — PROCALCITONIN: PROCALCITONIN: 0.25 ng/mL

## 2015-10-19 LAB — AMMONIA: AMMONIA: 43 umol/L — AB (ref 9–35)

## 2015-10-19 LAB — MAGNESIUM: Magnesium: 1.8 mg/dL (ref 1.7–2.4)

## 2015-10-19 MED ORDER — POTASSIUM CHLORIDE CRYS ER 20 MEQ PO TBCR
40.0000 meq | EXTENDED_RELEASE_TABLET | Freq: Once | ORAL | Status: AC
  Administered 2015-10-19: 40 meq via ORAL
  Filled 2015-10-19: qty 2

## 2015-10-19 MED ORDER — METOLAZONE 5 MG PO TABS
2.5000 mg | ORAL_TABLET | Freq: Two times a day (BID) | ORAL | Status: DC
  Administered 2015-10-19 (×2): 2.5 mg via ORAL
  Filled 2015-10-19 (×2): qty 1

## 2015-10-19 MED ORDER — POTASSIUM CHLORIDE 10 MEQ/100ML IV SOLN
10.0000 meq | INTRAVENOUS | Status: AC
  Administered 2015-10-20 (×2): 10 meq via INTRAVENOUS
  Filled 2015-10-19 (×2): qty 100

## 2015-10-19 MED ORDER — FUROSEMIDE 10 MG/ML IJ SOLN
15.0000 mg/h | INTRAVENOUS | Status: DC
  Administered 2015-10-19 – 2015-10-20 (×2): 15 mg/h via INTRAVENOUS
  Filled 2015-10-19 (×4): qty 25

## 2015-10-19 MED ORDER — POTASSIUM CHLORIDE CRYS ER 20 MEQ PO TBCR
20.0000 meq | EXTENDED_RELEASE_TABLET | Freq: Two times a day (BID) | ORAL | Status: DC
  Administered 2015-10-19 – 2015-10-23 (×8): 20 meq via ORAL
  Filled 2015-10-19 (×8): qty 1

## 2015-10-19 MED ORDER — SODIUM CHLORIDE 0.9 % IV SOLN
1.5000 g | Freq: Four times a day (QID) | INTRAVENOUS | Status: AC
  Administered 2015-10-19 – 2015-10-21 (×7): 1.5 g via INTRAVENOUS
  Filled 2015-10-19 (×8): qty 1.5

## 2015-10-19 NOTE — Progress Notes (Signed)
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 The patient was seen at the heart failure clinic on 10/16/2015 for worsening lower extremity edema. He was noted to be lethargic with oxygen saturation of 79%. He was sent to the emergency department for further evaluation. Assessment/Plan: Acute on chronic diastolic CHF/cor pulmonale-right heart failure -The patient was previously admitted July 2015 with acute on chronic diastolic CHF and developed cardiorenal syndrome requiring temporary dialysis -August 2015 Myoview negative for ischemia -At skilled nursing facility, patient's torsemide was increased to 80 mg twice a day on 09/26/2015 -Metolazone was given 1/2, 1/3, 1/4 -08/22/2015 discharge weight 224 pounds -10/17/2015 weight 262 -10/19/15-->transition to lasix drip per cardiology -I/Os not well recorded -08/16/2015 echo EF 55-60%, PAP 82 -continue sildafenil Acute on chronic respiratory failure with hypoxia and hypercarbia -Multifactorial including pulmonary hypertension, CHF, COPD, aspiration pneumonia, and likely underlying OHS/OSA -continue sildafenil -wean oxygen to keep saturation 90-94% TO AVOID HYPERCARBIA -stable on 1 to 2L [place]  Pulmonary infiltrates/consolidation/aspiration pneumonia -Concern about  aspiration--pt likely chronically aspirating  -Chek procalcitonin--<0.10 -speech therapy has been consulted-->regular diet with thin -d/c vancomycin -d/c zosyn-->unasyn  Acute encephalopathy/hepatic encephalopathy -Multifactorial including hyperammonemia, hypercarbia,  hypoxia, pneumonia -Last used alcohol greater than 4 months ago -Suspect underlying cognitive impairment -check B12 -TSH -ammonia 53-->45 -10/18/14--more awake, but remains pleasantly confused  Hyperammonemia -Likely due to chronic hepatic congestion secondary to his pulmonary hypertension -08/12/2015 ammonia 18 -10/16/2015 ammonia 45 -Continue lactulose to 30 grams every 8 hours--2 BM in past 24 hours  Chronic Atrial fibrillation -Not felt to be a candidate for anticoagulation secondary to poor compliance and continued alcohol use -Rate controlled -Continue bisoprolol -CHADSVASc = 6 -Continue aspirin  Thrombocytopenia -Likely due to underlying cirrhosis/chronic hepatic congestion -Chronic -Monitor for objective signs of bleeding  Diabetes mellitus type 2  -08/17/2015 hemoglobin A1c 6.8  -Previous problems with hypoglycemia on oral agents  -NovoLog sliding scale  -CBGs controlled -10/18/15--A1c--7.3  CAD status post CABG:  -Continue aspirin, statins and beta blockers  Hypertension -Continue bisoprolol, hydralazine, Imdur  History of right breast cancer: Status post mastectomy, continue tamoxifen.Follows with Dr Lindi Adie History of enterocutaneous fistula:  -Has post exploratory laparotomy and bowel resection in August 2016. Chronic small ulceration in the anterior abdominal wall that appears stable History of stroke  -October 2015  -continue ASA  Goals of Care -long discussion with daughter at [facility] with DNR but continue aggressive tx for now   Family Communication: Updated daughter on phone 10/19/15 Disposition Plan: Home when medically stable    Procedures/Studies: Dg Chest 2 View  10/16/2015  CLINICAL DATA:  Hypoxia for several days. Confusion. History of breast carcinoma EXAM: CHEST  2 VIEW COMPARISON:  August 15, 2015 FINDINGS: There is patchy airspace consolidation in both lung bases. There are small pleural effusions as well. The heart is  mildly enlarged with pulmonary vascularity within normal limits. Patient is status post coronary artery bypass grafting. No adenopathy. There is degenerative change in the thoracic spine. IMPRESSION: Bibasilar airspace consolidation. Suspect pneumonia, although alveolar edema could present in this manner. There may be both congestive heart failure and pneumonia  present. Note that there are small pleural effusions bilaterally with cardiomegaly. Electronically Signed   By: Lowella Grip III M.D.   On: 10/16/2015 14:34   Dg Chest Port 1 View  10/17/2015  CLINICAL DATA:  PICC placement. EXAM: PORTABLE CHEST 1 VIEW COMPARISON:  PA and lateral chest 10/16/2015. FINDINGS: A new right PICC is in place with the tip projecting in the mid to lower superior vena cava. Bibasilar airspace disease persists without marked change. No pneumothorax. There is cardiomegaly. IMPRESSION: Tip of right PICC projects in good position. No change in bilateral airspace disease. Electronically Signed   By: Inge Rise M.D.   On: 10/17/2015 11:33         Subjective: Pt more alert but remains confused.  2 BM in past 24 hours.  No reports of vomiting, cp, respiratory distress, uncontrolled pain.  No HA  Objective: Filed Vitals:   10/19/15 0735 10/19/15 1054 10/19/15 1117 10/19/15 1132  BP:  110/64    Pulse:      Temp: 98.4 F (36.9 C)   98.9 F (37.2 C)  TempSrc: Axillary   Oral  Resp:      Height:      Weight:      SpO2:   97%     Intake/Output Summary (Last 24 hours) at 10/19/15 1508 Last data filed at 10/19/15 1252  Gross per 24 hour  Intake   1188 ml  Output    300 ml  Net    888 ml   Weight change: 1.1 kg (2 lb 6.8 oz) Exam:   General:  Pt is alert, follows commands appropriately, not in acute distress  HEENT: No icterus, No thrush, No neck mass, [place]/AT  Cardiovascular: IRRR, S1/S2, no rubs, no gallops  Respiratory: bibasilar crackles, no wheeze  Abdomen: Soft/+BS, non tender, non  distended, no guarding; +anasarca  Extremities: 2+ LE edema, No lymphangitis, No petechiae, No rashes, no synovitis; no cyanosis or clubbing  Data Reviewed: Basic Metabolic Panel:  Recent Labs Lab 10/16/15 1330 10/17/15 0524 10/17/15 1240 10/18/15 0530 10/19/15 0407  NA 139 141  --  140 140  K 3.5 2.8*  --  3.4* 3.3*  CL 90* 90*  --  89* 87*  CO2 42* 42*  --  43* 40*  GLUCOSE 143* 129*  --  90 130*  BUN 54* 52*  --  51* 54*  CREATININE 1.55* 1.44*  --  1.47* 1.80*  CALCIUM 9.0 9.0  --  8.9 8.9  MG  --   --  1.7  --  1.8   Liver Function Tests:  Recent Labs Lab 10/16/15 1330  AST 16  ALT 8*  ALKPHOS 56  BILITOT 0.5  PROT 6.6  ALBUMIN 2.8*   No results for input(s): LIPASE, AMYLASE in the last 168 hours.  Recent Labs Lab 10/16/15 1330 10/18/15 0530 10/19/15 0407  AMMONIA 45* 53* 43*   CBC:  Recent Labs Lab 10/16/15 1330 10/17/15 0524 10/18/15 0530 10/19/15 0407  WBC 5.4 5.6 5.0 5.9  NEUTROABS 3.6  --   --   --   HGB 9.5* 10.0* 9.7* 9.9*  HCT 32.5* 35.3* 33.4* 35.9*  MCV 101.9* 101.1* 102.5* 104.4*  PLT 125* 139* 118* 107*   Cardiac Enzymes: No results for input(s): CKTOTAL, CKMB, CKMBINDEX, TROPONINI in the last 168 hours. BNP: Invalid input(s): POCBNP CBG:  Recent Labs Lab 10/18/15 1131 10/18/15 1636 10/18/15 2109 10/19/15 0726 10/19/15 1130  GLUCAP 127* 121* 149* 112* 102*  Recent Results (from the past 240 hour(s))  Blood Culture (routine x 2)     Status: None (Preliminary result)   Collection Time: 10/16/15  1:30 PM  Result Value Ref Range Status   Specimen Description BLOOD LEFT FOREARM  Final   Special Requests BOTTLES DRAWN AEROBIC AND ANAEROBIC 4CC  Final   Culture NO GROWTH 3 DAYS  Final   Report Status PENDING  Incomplete  Urine culture     Status: None   Collection Time: 10/16/15  2:35 PM  Result Value Ref Range Status   Specimen Description URINE, RANDOM  Final   Special Requests NONE  Final   Culture 40,000  COLONIES/ml ESCHERICHIA COLI  Final   Report Status 10/19/2015 FINAL  Final   Organism ID, Bacteria ESCHERICHIA COLI  Final      Susceptibility   Escherichia coli - MIC*    AMPICILLIN >=32 RESISTANT Resistant     CEFAZOLIN >=64 RESISTANT Resistant     CEFTRIAXONE <=1 SENSITIVE Sensitive     CIPROFLOXACIN >=4 RESISTANT Resistant     GENTAMICIN <=1 SENSITIVE Sensitive     IMIPENEM <=0.25 SENSITIVE Sensitive     NITROFURANTOIN <=16 SENSITIVE Sensitive     TRIMETH/SULFA <=20 SENSITIVE Sensitive     AMPICILLIN/SULBACTAM >=32 RESISTANT Resistant     PIP/TAZO >=128 RESISTANT Resistant     * 40,000 COLONIES/ml ESCHERICHIA COLI     Scheduled Meds: . acetaZOLAMIDE  500 mg Oral BID  . ampicillin-sulbactam (UNASYN) IV  1.5 g Intravenous Q6H  . antiseptic oral rinse  7 mL Mouth Rinse BID  . aspirin EC  325 mg Oral Daily  . atorvastatin  20 mg Oral QHS  . bisoprolol  2.5 mg Oral Daily  . enoxaparin (LOVENOX) injection  60 mg Subcutaneous Q24H  . folic acid  1 mg Oral Daily  . gabapentin  300 mg Oral BID  . hydrALAZINE  12.5 mg Oral TID  . insulin aspart  0-5 Units Subcutaneous QHS  . insulin aspart  0-9 Units Subcutaneous TID WC  . insulin glargine  6 Units Subcutaneous QHS  . ipratropium-albuterol  3 mL Nebulization BID  . lactulose  30 g Oral TID  . lactulose  300 mL Rectal Once  . levothyroxine  187 mcg Oral QAC breakfast  . metolazone  2.5 mg Oral BID  . pantoprazole  40 mg Oral Daily  . potassium chloride  20 mEq Oral BID  . sildenafil  20 mg Oral TID  . sodium chloride  10-40 mL Intracatheter Q12H  . sodium chloride  3 mL Intravenous Q12H  . tamoxifen  20 mg Oral Daily  . venlafaxine XR  225 mg Oral Q breakfast   Continuous Infusions: . furosemide (LASIX) infusion 15 mg/hr (10/19/15 1300)     [name] Amick, DO  [facility] Pager [phone]  If 7PM-7AM, please contact night-coverage [email or web address] Password [email or web address] 10/19/2015, 3:08 PM   LOS: 3 days

## 2015-10-19 NOTE — Progress Notes (Addendum)
Advanced Heart Failure Rounding Note   Subjective:    Continues with intermittent lethargy.  I/Os + yesterday despite increasing IV lasix. Weight up  2 pounds. CVP 19->16-> 15 (checked personally).  Co-ox 93%.(?)  Creatinine up slightly.     Objective:   Weight Range:  Vital Signs:   Temp:  [97.5 F (36.4 C)-98.4 F (36.9 C)] 98.4 F (36.9 C) (01/08 0735) Pulse Rate:  [65-72] 71 (01/08 0400) Resp:  [12-30] 17 (01/08 0400) BP: (108-125)/(56-74) 120/56 mmHg (01/08 0400) SpO2:  [91 %-97 %] 94 % (01/08 0400) Weight:  [116.5 kg (256 lb 13.4 oz)] 116.5 kg (256 lb 13.4 oz) (01/08 0400) Last BM Date: 10/18/15  Weight change: Filed Weights   10/17/15 0700 10/18/15 0400 10/19/15 0400  Weight: 116.257 kg (256 lb 4.8 oz) 115.4 kg (254 lb 6.6 oz) 116.5 kg (256 lb 13.4 oz)    Intake/Output:   Intake/Output Summary (Last 24 hours) at 10/19/15 0932 Last data filed at 10/19/15 0900  Gross per 24 hour  Intake 1965.33 ml  Output      0 ml  Net 1965.33 ml     PHYSICAL EXAM: General: Elderly and chronically ill appearing. In bed. Arouses to voice then falls abck to sleep  HEENT: Normal  Neck: supple. JVP difficult to see but does not appear elevated. Carotids 2+ bilat; no bruits. No thyromegaly noted. Cor: PMI nondisplaced. Irregular. No murmur, gallop, or rub noted.  Lungs: dull at bases R>L  with mild crackles Abdomen: Obese, soft, nontender, + distended, +BS Extremities: no cyanosis, clubbing, rash. R and LLE with compression wraps. 1-2+ edema  +scrotal and penile edema.  Neuro: Arouses to voice then falls back asleep, cranial nerves grossly intact. moves all 4 extremities w/o difficulty. Affect pleasant  Telemetry: A fib 90s   Labs: Basic Metabolic Panel:  Recent Labs Lab 10/16/15 1330 10/17/15 0524 10/17/15 1240 10/18/15 0530 10/19/15 0407  NA 139 141  --  140 140  K 3.5 2.8*  --  3.4* 3.3*  CL 90* 90*  --  89* 87*  CO2 42* 42*  --  43* 40*  GLUCOSE 143*  129*  --  90 130*  BUN 54* 52*  --  51* 54*  CREATININE 1.55* 1.44*  --  1.47* 1.80*  CALCIUM 9.0 9.0  --  8.9 8.9  MG  --   --  1.7  --  1.8    Liver Function Tests:  Recent Labs Lab 10/16/15 1330  AST 16  ALT 8*  ALKPHOS 56  BILITOT 0.5  PROT 6.6  ALBUMIN 2.8*   No results for input(s): LIPASE, AMYLASE in the last 168 hours.  Recent Labs Lab 10/16/15 1330 10/18/15 0530 10/19/15 0407  AMMONIA 45* 53* 43*    CBC:  Recent Labs Lab 10/16/15 1330 10/17/15 0524 10/18/15 0530 10/19/15 0407  WBC 5.4 5.6 5.0 5.9  NEUTROABS 3.6  --   --   --   HGB 9.5* 10.0* 9.7* 9.9*  HCT 32.5* 35.3* 33.4* 35.9*  MCV 101.9* 101.1* 102.5* 104.4*  PLT 125* 139* 118* 107*    Cardiac Enzymes: No results for input(s): CKTOTAL, CKMB, CKMBINDEX, TROPONINI in the last 168 hours.  BNP: BNP (last 3 results)  Recent Labs  09/03/15 1604 09/25/15 1245 10/16/15 1330  BNP 395.2* 365.1* 851.3*    ProBNP (last 3 results) No results for input(s): PROBNP in the last 8760 hours.    Other results:  Imaging: Dg Chest [facility] [address]  10/17/2015  CLINICAL DATA:  PICC placement. EXAM: PORTABLE CHEST 1 VIEW COMPARISON:  PA and lateral chest 10/16/2015. FINDINGS: A new right PICC is in place with the tip projecting in the mid to lower superior vena cava. Bibasilar airspace disease persists without marked change. No pneumothorax. There is cardiomegaly. IMPRESSION: Tip of right PICC projects in good position. No change in bilateral airspace disease. Electronically Signed   By: Inge Rise M.D.   On: 10/17/2015 11:33     Medications:     Scheduled Medications: . acetaZOLAMIDE  500 mg Oral BID  . antiseptic oral rinse  7 mL Mouth Rinse BID  . aspirin EC  325 mg Oral Daily  . atorvastatin  20 mg Oral QHS  . bisoprolol  2.5 mg Oral Daily  . enoxaparin (LOVENOX) injection  60 mg Subcutaneous Q24H  . folic acid  1 mg Oral Daily  . gabapentin  300 mg Oral BID  . hydrALAZINE  12.5 mg Oral TID   . insulin aspart  0-5 Units Subcutaneous QHS  . insulin aspart  0-9 Units Subcutaneous TID WC  . insulin glargine  6 Units Subcutaneous QHS  . ipratropium-albuterol  3 mL Nebulization BID  . lactulose  30 g Oral TID  . lactulose  300 mL Rectal Once  . levothyroxine  187 mcg Oral QAC breakfast  . pantoprazole  40 mg Oral Daily  . piperacillin-tazobactam (ZOSYN)  IV  3.375 g Intravenous 3 times per day  . potassium chloride  20 mEq Oral BID  . sildenafil  20 mg Oral TID  . sodium chloride  10-40 mL Intracatheter Q12H  . sodium chloride  3 mL Intravenous Q12H  . tamoxifen  20 mg Oral Daily  . venlafaxine XR  225 mg Oral Q breakfast    Infusions:    PRN Medications: sodium chloride, acetaminophen, albuterol, ondansetron (ZOFRAN) IV, oxyCODONE, sodium chloride, sodium chloride   Assessment:   1. A/C Diastolic Heart Failure 2. Acute Respiratory Failure with Hypercarbia 3. H/O Cirrohsis with hepatic encephalopathy--> Elevated ammonia 4. R Pleural Effusion 5. PAH 6. A fib -Chronic 7. CKD 8. H/O Breast Cancer S/P mastectomy 9. Hypokalemia 10. Acute delirium/lethargy 11. DNR  Plan/Discussion:    Weight and CVP still up despite increasing lasix. Now has Foley. Will switch to lasix gtt and add metolazone.  Continue diamox. With normal co-ox norole for inotropes at this point. Renal function slightly worse. Will follow with diuresis. Supp K+.   Delirium waxing and waning. On lactulose.  AF is chronic. Rate controlled. Not candidate for [facility].   Now DNR. Given lack of progress and overall downward course over past few months I think it may be time for Palliative Care Consult. Will place consult for goals of care.   Bensimhon, Daniel,MD 9:32 AM Advanced Heart Failure Team Pager [phone] (M-F; 7a - 4p)  Please contact [facility] for night-coverage after hours (4p -7a ) and weekends on [email or web address]   Length of Stay: 3

## 2015-10-19 NOTE — Progress Notes (Signed)
Pharmacy Antibiotic Follow-up Note  [name] is a 70 y.o. year-old male admitted on 10/16/2015.  The patient is currently on day 4 of Zosyn for Pulmonary infiltrates/consolidation.  Also growing Ecoli in urine culture, but only 40,000 colonies/ml.  Resistant to Zosyn, but ?need to treat  Assessment/Plan: 1. Continue Zosyn 3.375g IV q 8 hrs.  F/u LOT.   Temp (24hrs), Avg:98.2 F (36.8 C), Min:97.5 F (36.4 C), Max:98.9 F (37.2 C)   Recent Labs Lab 10/16/15 1330 10/17/15 0524 10/18/15 0530 10/19/15 0407  WBC 5.4 5.6 5.0 5.9    Recent Labs Lab 10/16/15 1330 10/17/15 0524 10/18/15 0530 10/19/15 0407  CREATININE 1.55* 1.44* 1.47* 1.80*   Estimated Creatinine Clearance: 47.4 mL/min (by C-G formula based on Cr of 1.8).    Allergies  Allergen Reactions  . Erythromycin Hives    Oramycin    Antimicrobials this admission: Vanc 1/5>>1/7 Zosyn 1/5>>  Levels/dose changes this admission:   Microbiology results: 1/5 blood x 2>> 1/5 urine>> 40K/ml Ecoli - resistant to Zosyn   Thank you for allowing pharmacy to be a part of this patient's care.  Uvaldo Rising, BCPS  Clinical Pharmacist Pager [phone]  10/19/2015 12:48 PM

## 2015-10-19 NOTE — Evaluation (Signed)
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 Admitted for Acute on chronic diastolic CHF/cor pulmonale-right heart failure, Acute on chronic respiratory failure with hypoxia and hypercarbia, acute encephalopathy, Hyperammonemia, and thrombocytopenia.  Clinical Impression  Pt admitted with the above complications. Pt currently with functional limitations due to the deficits listed below (see PT Problem List). Limited evaluation due to altered mental status, disoriented x4. Difficulty following simple commands. Max assist +2 for bed mobility, pushing very heavily to posterior upon sitting EOB. Family expressed refusal for pt to return to same SNF and would like other SNF options for d/c. Pt will benefit from skilled PT to increase their independence and safety with mobility to allow discharge to the venue listed below.       Follow Up Recommendations SNF - Family does not want pt to return to same SNF, would like other options in the area.    Equipment Recommendations  None recommended by PT    Recommendations for Other Services       Precautions / Restrictions Precautions Precautions: Fall Precaution Comments: monitor O2 Restrictions Weight Bearing Restrictions: No      Mobility  Bed Mobility Overal bed mobility: Needs Assistance Bed Mobility: Supine to Sit;Sit to Sidelying;Rolling Rolling: Max assist;+2 for physical assistance   Supine to sit: Max assist;+2 for physical assistance;HOB elevated   Sit to sidelying: Max assist;+2 for physical assistance General bed mobility comments: Max assist using  helicopter technique with tactile and verbal cues. Initially assists with pulling through PTs hand but required +2 assist to achieve sitting EOB. Began pushing heavily towards posterior. Max assist to return to sidelying for LE support and +2 to control trunk. Max assist to facilitate roll onto back.  Transfers                 General transfer comment: Not safe due to pushing backwards heavily  Ambulation/Gait                Stairs            Wheelchair Mobility    Modified Rankin (Stroke Patients Only)       Balance Overall balance assessment: Needs assistance Sitting-balance support: Bilateral upper extremity supported;Feet supported Sitting balance-Leahy Scale: Zero Sitting balance - Comments: Pushing posteriorly. Max assist for balance. Attempted Rt and Lt lateral lean onto elbows with modest success, able to assist with return back to midline but immediately pushes posteriorly again. Great difficulty following commands for seated balance tasks. Only tolerate approx 4 minutes upright. SpO2 90% on 2.5L supplemental O2. Poor waveform. Pt anxious with all mobility.                                     Pertinent Vitals/Pain Pain Assessment:  (CPOT- 4/8) Pain Descriptors / Indicators: Guarding Pain Intervention(s): Limited activity within patient's tolerance;Monitored during session;Repositioned    Home Living Family/patient expects to be discharged to:: Skilled nursing facility Living Arrangements: Children Available Help at Discharge: Family;Available 24 hours/day Type of Home: House Home Access: Stairs to enter Entrance Stairs-Rails: Right;Left;Can reach both Entrance Stairs-Number of Steps: 4 Home  Layout: One level Home Equipment: Walker - 2 wheels;Bedside commode;Shower seat Additional Comments:  (Information obtained from previous notes and daughter)    Prior Function Level of Independence: Needs assistance   Gait / Transfers  Assistance Needed: Daughter reports pt was making strong progress with physical therapy at SNF. States he was ambulatory with a rolling walker and assistance from PT. States he could self-propel in w/c short distances.  ADL's / Homemaking Assistance Needed: Needed assist for ADLs        Hand Dominance   Dominant Hand: Right    Extremity/Trunk Assessment   Upper Extremity Assessment: Defer to OT evaluation           Lower Extremity Assessment: Generalized weakness;Difficult to assess due to impaired cognition (poor ability to follow commands due to AMS) RLE Deficits / Details: LEs bandaged LLE Deficits / Details: LEs bandaged.     Communication   Communication:  (Difficult to assess due to AMS)  Cognition Arousal/Alertness: Lethargic Behavior During Therapy: Anxious;Flat affect Overall Cognitive Status: Impaired/Different from baseline Area of Impairment: Orientation;Following commands;Safety/judgement;Problem solving Orientation Level: Disoriented to;Person;Place;Time;Situation     Following Commands: Follows one step commands inconsistently Safety/Judgement: Decreased awareness of safety;Decreased awareness of deficits   Problem Solving: Slow processing;Decreased initiation;Difficulty sequencing;Requires verbal cues;Requires tactile cues General Comments: Only responding intermittently with verbal and tactile cues. Inaccurate with all answers. Some automatic motor responses such as reaching for PT hand when facilitating bed mobility.    General Comments General comments (skin integrity, edema, etc.): Daughter present initailly. Wants pt to go to a different SNF.    Exercises        Assessment/Plan    PT Assessment Patient needs continued PT services  PT Diagnosis Difficulty walking;Generalized weakness;Altered mental status   PT Problem List Decreased strength;Decreased activity tolerance;Decreased balance;Decreased mobility;Decreased coordination;Decreased  cognition;Decreased knowledge of use of DME;Decreased safety awareness;Decreased knowledge of precautions;Cardiopulmonary status limiting activity;Obesity;Pain  PT Treatment Interventions DME instruction;Gait training;Functional mobility training;Therapeutic activities;Therapeutic exercise;Balance training;Neuromuscular re-education;Cognitive remediation;Patient/family education;Wheelchair mobility training   PT Goals (Current goals can be found in the Care Plan section) Acute Rehab PT Goals Patient Stated Goal: none stated PT Goal Formulation: With family Time For Goal Achievement: 11/02/15 Potential to Achieve Goals: Fair    Frequency Min 2X/week   Barriers to discharge        Co-evaluation               End of Session Equipment Utilized During Treatment: Oxygen Activity Tolerance: Treatment limited secondary to medical complications (Comment) (AMS) Patient left: in bed;with call bell/phone within reach;with bed alarm set Nurse Communication: Mobility status;Precautions         Time: [id number] PT Time Calculation (min) (ACUTE ONLY): 17 min   Charges:   PT Evaluation $PT Eval High Complexity: 1 Procedure     PT G CodesEllouise Newer 10/19/2015, 3:26 PM  Camille Bal [place], [place]

## 2015-10-20 DIAGNOSIS — N179 Acute kidney failure, unspecified: Secondary | ICD-10-CM

## 2015-10-20 DIAGNOSIS — Z515 Encounter for palliative care: Secondary | ICD-10-CM

## 2015-10-20 LAB — CARBOXYHEMOGLOBIN
CARBOXYHEMOGLOBIN: 2.5 % — AB (ref 0.5–1.5)
METHEMOGLOBIN: 0.9 % (ref 0.0–1.5)
O2 Saturation: 80 %
TOTAL HEMOGLOBIN: 10.1 g/dL — AB (ref 13.5–18.0)

## 2015-10-20 LAB — GLUCOSE, CAPILLARY
GLUCOSE-CAPILLARY: 95 mg/dL (ref 65–99)
GLUCOSE-CAPILLARY: 93 mg/dL (ref 65–99)
Glucose-Capillary: 99 mg/dL (ref 65–99)
Glucose-Capillary: 140 mg/dL — ABNORMAL HIGH (ref 65–99)

## 2015-10-20 LAB — CBC
HCT: 36.8 % — ABNORMAL LOW (ref 39.0–52.0)
HEMOGLOBIN: 9.8 g/dL — AB (ref 13.0–17.0)
MCH: 28.2 pg (ref 26.0–34.0)
MCHC: 26.6 g/dL — ABNORMAL LOW (ref 30.0–36.0)
MCV: 105.7 fL — AB (ref 78.0–100.0)
PLATELETS: 127 10*3/uL — AB (ref 150–400)
RBC: 3.48 MIL/uL — AB (ref 4.22–5.81)
RDW: 17.6 % — ABNORMAL HIGH (ref 11.5–15.5)
WBC: 6.6 10*3/uL (ref 4.0–10.5)

## 2015-10-20 LAB — BASIC METABOLIC PANEL
Anion gap: 12 (ref 5–15)
BUN: 55 mg/dL — AB (ref 6–20)
CALCIUM: 8.5 mg/dL — AB (ref 8.9–10.3)
CO2: 40 mmol/L — ABNORMAL HIGH (ref 22–32)
CREATININE: 2.04 mg/dL — AB (ref 0.61–1.24)
Chloride: 87 mmol/L — ABNORMAL LOW (ref 101–111)
GFR calc Af Amer: 36 mL/min — ABNORMAL LOW (ref >60)
GFR, EST NON AFRICAN AMERICAN: 31 mL/min — AB (ref >60)
GLUCOSE: 160 mg/dL — AB (ref 65–99)
Potassium: 3.3 mmol/L — ABNORMAL LOW (ref 3.5–5.1)
SODIUM: 139 mmol/L (ref 135–145)

## 2015-10-20 LAB — AMMONIA: Ammonia: 52 umol/L — ABNORMAL HIGH (ref 9–35)

## 2015-10-20 MED ORDER — AMOXICILLIN-POT CLAVULANATE 500-125 MG PO TABS
1.0000 | ORAL_TABLET | Freq: Two times a day (BID) | ORAL | Status: DC
  Administered 2015-10-21 – 2015-10-22 (×3): 500 mg via ORAL
  Filled 2015-10-20 (×5): qty 1

## 2015-10-20 MED ORDER — FENTANYL CITRATE (PF) 100 MCG/2ML IJ SOLN
12.5000 ug | INTRAMUSCULAR | Status: DC | PRN
  Administered 2015-10-20: 12.5 ug via INTRAVENOUS
  Administered 2015-10-22 – 2015-10-23 (×2): 25 ug via INTRAVENOUS
  Filled 2015-10-20 (×3): qty 2

## 2015-10-20 MED ORDER — LACTULOSE ENEMA
300.0000 mL | Freq: Once | ORAL | Status: DC
  Filled 2015-10-20: qty 300

## 2015-10-20 NOTE — Progress Notes (Signed)
Advanced Heart Failure Rounding Note   Subjective:    Has worsening lethargy. Confused. Creatinine continues to get worse. Now on unasyn for possible aspiration. CVP 15 (measured personally)   Objective:   Weight Range:  Vital Signs:   Temp:  [97.7 F (36.5 C)-98.9 F (37.2 C)] 97.7 F (36.5 C) (01/09 0752) Pulse Rate:  [64-83] 70 (01/09 0600) Resp:  [16-21] 18 (01/09 0600) BP: (105-122)/(59-69) 115/59 mmHg (01/09 0400) SpO2:  [94 %-98 %] 98 % (01/09 0930) Weight:  [115.4 kg (254 lb 6.6 oz)] 115.4 kg (254 lb 6.6 oz) (01/09 0600) Last BM Date: 10/18/15  Weight change: Filed Weights   10/18/15 0400 10/19/15 0400 10/20/15 0600  Weight: 115.4 kg (254 lb 6.6 oz) 116.5 kg (256 lb 13.4 oz) 115.4 kg (254 lb 6.6 oz)    Intake/Output:   Intake/Output Summary (Last 24 hours) at 10/20/15 1014 Last data filed at 10/20/15 0600  Gross per 24 hour  Intake   1006 ml  Output   1225 ml  Net   -219 ml     PHYSICAL EXAM: General: Elderly and chronically ill appearing. In bed. Arouses to voice then falls back to sleep. Confused. mumbling  HEENT: Normal  Neck: supple. JVP difficult to seeCarotids 2+ bilat; no bruits. No thyromegaly noted. Cor: PMI nondisplaced. Irregular. No murmur, gallop, or rub noted.  Lungs: dull at bases R>L  with mild crackles Abdomen: Obese, soft, nontender, + distended, +BS Extremities: no cyanosis, clubbing, rash. R and LLE with compression wraps. 1-2+ edema Neuro: Arouses to voice then falls back asleep, cranial nerves grossly intact. moves all 4 extremities w/o difficulty. Affect pleasant  Telemetry: A fib 90s   Labs: Basic Metabolic Panel:  Recent Labs Lab 10/16/15 1330 10/17/15 0524 10/17/15 1240 10/18/15 0530 10/19/15 0407 10/20/15 0500  NA 139 141  --  140 140 139  K 3.5 2.8*  --  3.4* 3.3* 3.3*  CL 90* 90*  --  89* 87* 87*  CO2 42* 42*  --  43* 40* 40*  GLUCOSE 143* 129*  --  90 130* 160*  BUN 54* 52*  --  51* 54* 55*  CREATININE  1.55* 1.44*  --  1.47* 1.80* 2.04*  CALCIUM 9.0 9.0  --  8.9 8.9 8.5*  MG  --   --  1.7  --  1.8  --     Liver Function Tests:  Recent Labs Lab 10/16/15 1330  AST 16  ALT 8*  ALKPHOS 56  BILITOT 0.5  PROT 6.6  ALBUMIN 2.8*   No results for input(s): LIPASE, AMYLASE in the last 168 hours.  Recent Labs Lab 10/18/15 0530 10/19/15 0407 10/20/15 0500  AMMONIA 53* 43* 52*    CBC:  Recent Labs Lab 10/16/15 1330 10/17/15 0524 10/18/15 0530 10/19/15 0407 10/20/15 0500  WBC 5.4 5.6 5.0 5.9 6.6  NEUTROABS 3.6  --   --   --   --   HGB 9.5* 10.0* 9.7* 9.9* 9.8*  HCT 32.5* 35.3* 33.4* 35.9* 36.8*  MCV 101.9* 101.1* 102.5* 104.4* 105.7*  PLT 125* 139* 118* 107* 127*    Cardiac Enzymes: No results for input(s): CKTOTAL, CKMB, CKMBINDEX, TROPONINI in the last 168 hours.  BNP: BNP (last 3 results)  Recent Labs  09/03/15 1604 09/25/15 1245 10/16/15 1330  BNP 395.2* 365.1* 851.3*    ProBNP (last 3 results) No results for input(s): PROBNP in the last 8760 hours.    Other results:  Imaging: No results found.  Medications:     Scheduled Medications: . ampicillin-sulbactam (UNASYN) IV  1.5 g Intravenous Q6H  . antiseptic oral rinse  7 mL Mouth Rinse BID  . aspirin EC  325 mg Oral Daily  . atorvastatin  20 mg Oral QHS  . bisoprolol  2.5 mg Oral Daily  . enoxaparin (LOVENOX) injection  60 mg Subcutaneous Q24H  . folic acid  1 mg Oral Daily  . gabapentin  300 mg Oral BID  . hydrALAZINE  12.5 mg Oral TID  . insulin aspart  0-5 Units Subcutaneous QHS  . insulin aspart  0-9 Units Subcutaneous TID WC  . insulin glargine  6 Units Subcutaneous QHS  . ipratropium-albuterol  3 mL Nebulization BID  . lactulose  30 g Oral TID  . levothyroxine  187 mcg Oral QAC breakfast  . metolazone  2.5 mg Oral BID  . pantoprazole  40 mg Oral Daily  . potassium chloride  20 mEq Oral BID  . sildenafil  20 mg Oral TID  . sodium chloride  10-40 mL Intracatheter Q12H  . sodium  chloride  3 mL Intravenous Q12H  . tamoxifen  20 mg Oral Daily  . venlafaxine XR  225 mg Oral Q breakfast    Infusions: . furosemide (LASIX) infusion 15 mg/hr (10/20/15 0804)    PRN Medications: sodium chloride, acetaminophen, albuterol, ondansetron (ZOFRAN) IV, oxyCODONE, sodium chloride, sodium chloride   Assessment:   1. A/C Diastolic Heart Failure 2. Acute Respiratory Failure with Hypercarbia 3. H/O Cirrohsis with hepatic encephalopathy--> Elevated ammonia 4. R Pleural Effusion 5. PAH 6. A fib -Chronic 7. Acute on CKD 8. H/O Breast Cancer S/P mastectomy 9. Hypokalemia 10. Acute delirium/lethargy 11. DNR  Plan/Discussion:    Volume status remains elevated but renal function worse. Co-ox ok so doubt low output. Will stop IV lasix and metolazone. Continue diamox for now. Consider switch from unasyn to zosyn as unasyn has a high salt load.   AF is chronic. Rate controlled. Not candidate for [facility].   Now DNR. Delirium seems to be worsening. Given lack of progress and overall downward course over past few months I think it may be time for Palliative Care Consult. Consult for goals of care placed.   Bensimhon, Daniel,MD 10:14 AM Advanced Heart Failure Team Pager [phone] (M-F; 7a - 4p)  Please contact [facility] for night-coverage after hours (4p -7a ) and weekends on [email or web address]   Length of Stay: 4

## 2015-10-20 NOTE — Consult Note (Signed)
Consultation Note Date: 10/20/2015   Patient Name: [name]  DOB: 04-Mar-1946  MRN: [id number]  Age / Sex: 70 y.o., male  PCP: Jene Every, MD Referring Physician: Orson Eva, MD  Reason for Consultation: Establishing goals of care    Clinical Assessment/Narrative: I met today with Mr. Visconti who is lethargic and disoriented - unable to participate in conversation. I spoke more with his daughter and son-in-law, [name] and Coralyn Mark. I explained that unfortunately Mr. Calandra has been continuing to decline despite our best efforts - kidney function continues to worsen along with mental status and  Intake. They are both very tearful and acknowledge that they have seen the decline and are more accepting now that he is approaching end of life. [name] says "I just don't want him to suffer." They definitely want to focus on comfort and do not want him to experience any pain, discomfort, or shortness of breath.   We did discuss our goals moving forward and we discussed the possibility of utilizing hospice facility to keep him comfortable and they seemed open but want to continue labs, meds, supportive care today and we will discuss more tomorrow after seeing how he does the next ~24 hrs. They are understandably upset and tell me about all the loss that they have experienced in their families. They are also concerned as Mr. Sesay has not made any preparations for the end of his life and are stressed about what they may be faced with on top of dealing with their loss. They are very supportive of each other. Emotional support provided.   Contacts/Participants in Discussion: Primary Decision Maker: Darrold Junker   Relationship to Patient daughter  SUMMARY OF RECOMMENDATIONS - Comfort is priority - added prn fentanyl  - Will further discuss goals tomorrow - Continue supportive care for now  Code Status/Advance Care Planning: DNR    Code  Status Orders        Start     Ordered   10/18/15 1053  Do not attempt resuscitation (DNR)   Continuous    Question Answer Comment  In the event of cardiac or respiratory ARREST Do not call a "code blue"   In the event of cardiac or respiratory ARREST Do not perform Intubation, CPR, defibrillation or ACLS   In the event of cardiac or respiratory ARREST Use medication by any route, position, wound care, and other measures to relive pain and suffering. May use oxygen, suction and manual treatment of airway obstruction as needed for comfort.      10/18/15 1052      Symptom Management:   None currently.  Anticipate he may have shortness of breath with worsening heart failure - adding fentanyl 12.5-25 mcg every hour prn.   Continue lactulose for ammonia and encephalopathy although unsure how long he may be able to take po.   Palliative Prophylaxis:   Aspiration, Delirium Protocol and Frequent Pain Assessment  Psycho-social/Spiritual:  Support System: Strong Desire for further Chaplaincy support:no Additional Recommendations: Caregiving  Support/Resources, Education on Hospice and Grief/Bereavement Support  Prognosis: Could be days with anticipated worsening renal function and heart failure. RN states he appears much declined to her from even yesterday in terms of lethargy/alertness, cyanotic nail beds although warm extremities, and worsening intake.   Discharge Planning: Likely hospice facility vs hospital death.    Chief Complaint/ Primary Diagnoses: Present on Admission:  . Pleural effusion . Fluid overload . A-fib ([place]) . Acute encephalopathy . Acute on chronic diastolic CHF (congestive heart  failure), NYHA class 3 ([place]) . Acute respiratory failure with hypoxia ([place]) . HCAP (healthcare-associated pneumonia) . Hypertension . PAF (paroxysmal atrial fibrillation) ([place])  I have reviewed the medical record, interviewed the patient and family, and examined the patient. The  following aspects are pertinent.  Past Medical History  Diagnosis Date  . CHRONIC OBSTRUCTIVE PULMONARY DISEASE 06/20/2009  . CAROTID STENOSIS 06/20/2009    A. 08/2001 s/p L CEA;  B.   09/14/11 - Carotid U/S - 40-59% bilateral stenosis, left CEA patch angioplasty is patent  . DM 06/20/2009  . CAD 06/20/2009    A.  08/2000 - s/p CABG x 4 - LIMA-LAD, Left Radial-OM, VG-DIAG, VG-RCA;  B. Neg. MV  2010  . HYPERLIPIDEMIA 06/20/2009  . HYPERTENSION 06/20/2009  . Hypothyroidism   . Low back pain   . Asthma     as child  . Pneumonia   . Persistent atrial fibrillation (HCC)     Not felt to be coumadin candidate 2/2 ETOH use.  Marland Kitchen History of tobacco abuse     remote - quit 1970  . Morbidly obese ([place])   . Chronic diastolic heart failure, NYHA class 3 ([place])     Followed by CHF Clinic --> Echo 04/2014: EF 32-95%, Diastolic DysFxn - elevated LVEDP & LAP. Mod Dil LA & RA.  . Falls frequently   . Hx of cardiovascular stress test 05/2014    Lexiscan Myoview (8/15):  No ischemia; EF 63% - Normal Study  . Breast cancer, right breast ([place])   . Gout   . OBSTRUCTIVE SLEEP APNEA 06/20/2009    does not use cpap  . Stroke [facility])     found on MRI in Oct. 2015.   Marland Kitchen Shortness of breath dyspnea     occasional  . Depression     takes Prozac  . GERD (gastroesophageal reflux disease)     takes Prilosec  . Arthritis   . Neuromuscular disorder (HCC)     neuropathy in both legs  . Constipation   . ETOH abuse     no alcohol since Oct. 2015  . 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 CHF (congestive heart failure) ([place])   . Supplemental oxygen dependent     hs  . Bilateral renal cysts   . Kidney disease     Stage 3  . Pulmonary HTN ([place])     a. [place] 11/16: severe pulmonary HTN, PASP 57, PA sat 42%, Ao sat 90%, CO 4.2 L/min, CI 1.88, PCWP mean 24, RA mean 14   Social History   Social History  . Marital Status: Single    Spouse Name: N/A  . Number of  Children: N/A  . Years of Education: N/A   Occupational History  . retired    Social History Main Topics  . Smoking status: Former Smoker -- 1.00 packs/day for 16 years    Quit date: 10/11/1968  . Smokeless tobacco: Never Used  . Alcohol Use: Yes     Comment: last drink was 2 weeks - 8?16  . Drug Use: Yes    Special: Marijuana     Comment: last 1 year ago  . Sexual Activity: No   Other Topics Concern  . Not on file   Social History Narrative   Lives in [place] with dtr, son-in-law   Family History  Problem Relation Age of Onset  . CVA Mother     ?  Marland Kitchen  Cancer Mother     unknown type of cancer  . CVA Father     ?  Marland Kitchen Heart attack Neg Hx   . Stomach cancer Brother 23    stomach cancer   Scheduled Meds: . ampicillin-sulbactam (UNASYN) IV  1.5 g Intravenous Q6H  . antiseptic oral rinse  7 mL Mouth Rinse BID  . aspirin EC  325 mg Oral Daily  . atorvastatin  20 mg Oral QHS  . bisoprolol  2.5 mg Oral Daily  . enoxaparin (LOVENOX) injection  60 mg Subcutaneous Q24H  . folic acid  1 mg Oral Daily  . gabapentin  300 mg Oral BID  . hydrALAZINE  12.5 mg Oral TID  . insulin aspart  0-5 Units Subcutaneous QHS  . insulin aspart  0-9 Units Subcutaneous TID WC  . insulin glargine  6 Units Subcutaneous QHS  . ipratropium-albuterol  3 mL Nebulization BID  . lactulose  30 g Oral TID  . levothyroxine  187 mcg Oral QAC breakfast  . pantoprazole  40 mg Oral Daily  . potassium chloride  20 mEq Oral BID  . sildenafil  20 mg Oral TID  . sodium chloride  10-40 mL Intracatheter Q12H  . sodium chloride  3 mL Intravenous Q12H  . tamoxifen  20 mg Oral Daily  . venlafaxine XR  225 mg Oral Q breakfast   Continuous Infusions:  PRN Meds:.sodium chloride, acetaminophen, albuterol, ondansetron (ZOFRAN) IV, oxyCODONE, sodium chloride, sodium chloride Medications Prior to Admission:  Prior to Admission medications   Medication Sig Start Date End Date Taking? Authorizing Provider  albuterol  (PROVENTIL) (2.5 MG/3ML) 0.083% nebulizer solution Take 3 mLs (2.5 mg total) by nebulization every 2 (two) hours as needed for wheezing or shortness of breath. 08/22/15  Yes Shanker Kristeen Mans, MD  allopurinol (ZYLOPRIM) 300 MG tablet Take 300 mg by mouth daily.   Yes Historical Provider, MD  aspirin EC 325 MG EC tablet Take 1 tablet (325 mg total) by mouth daily. 08/02/14  Yes Daniel J Angiulli, PA-C  atorvastatin (LIPITOR) 20 MG tablet Take 1 tablet (20 mg total) by mouth daily. 08/02/14  Yes Daniel J Angiulli, PA-C  carvedilol (COREG) 3.125 MG tablet Take 2 tablets (6.25 mg total) by mouth 2 (two) times daily with a meal. 07/07/15  Yes Bonnielee Haff, MD  cetirizine (ZYRTEC) 10 MG tablet Take 10 mg by mouth daily.   Yes Historical Provider, MD  colchicine 0.6 MG tablet Take 0.6 mg by mouth daily. 01/13/15  Yes Historical Provider, MD  fluticasone (FLOVENT HFA) 110 MCG/ACT inhaler Inhale 1 puff into the lungs 2 (two) times daily.    Yes Historical Provider, MD  folic acid (FOLVITE) 1 MG tablet Take 1 tablet (1 mg total) by mouth daily. 08/02/14  Yes Daniel J Angiulli, PA-C  gabapentin (NEURONTIN) 300 MG capsule Take 1 capsule (300 mg total) by mouth 2 (two) times daily. 08/02/14  Yes Daniel J Angiulli, PA-C  guaiFENesin (MUCINEX) 600 MG 12 hr tablet Take 600 mg by mouth 2 (two) times daily.   Yes Historical Provider, MD  hydrALAZINE (APRESOLINE) 100 MG tablet Take 1 tablet (100 mg total) by mouth every 8 (eight) hours. 08/22/15  Yes Shanker Kristeen Mans, MD  insulin aspart (NOVOLOG) 100 UNIT/ML injection 0-9 Units, Subcutaneous, 3 times daily with meals, First dose on Tue 08/12/15 at 0800 Correction coverage: Sensitive (thin, NPO, renal) CBG < 70: implement hypoglycemia protocol CBG 70 - 120: 0 units CBG 121 - 150: 1 unit  CBG 151 - 200: 2 units CBG 201 - 250: 3 units CBG 251 - 300: 5 units CBG 301 - 350: 7 units CBG 351 - 400: 9 units CBG > 400: call MD 08/22/15  Yes Shanker Kristeen Mans, MD  Insulin  Glargine (LANTUS SOLOSTAR) 100 UNIT/ML Solostar Pen Inject 6 Units into the skin daily at 10 pm.   Yes Historical Provider, MD  ipratropium-albuterol (DUONEB) 0.5-2.5 (3) MG/3ML SOLN Take 3 mLs by nebulization 2 (two) times daily. 08/22/15  Yes Shanker Kristeen Mans, MD  isosorbide dinitrate (ISORDIL) 10 MG tablet Take 1 tablet (10 mg total) by mouth 3 (three) times daily. 08/22/15  Yes Shanker Kristeen Mans, MD  levothyroxine (SYNTHROID, LEVOTHROID) 125 MCG tablet Take 1.5 tablets (187 mcg total) by mouth daily before breakfast. 08/02/14  Yes Daniel J Angiulli, PA-C  magnesium oxide (MAG-OX) 400 (241.3 MG) MG tablet Take 1 tablet (400 mg total) by mouth 2 (two) times daily. 06/26/15  Yes Scott Joylene Draft, PA-C  metolazone (ZAROXOLYN) 5 MG tablet Take 5 mg by mouth every evening.   Yes Historical Provider, MD  omeprazole (PRILOSEC) 20 MG capsule Take 1 capsule (20 mg total) by mouth daily. 08/02/14  Yes Daniel J Angiulli, PA-C  Oxycodone HCl 10 MG TABS Take 1 tablet (10 mg total) by mouth 3 (three) times daily as needed (pain). 08/22/15  Yes Shanker Kristeen Mans, MD  potassium chloride SA (KLOR-CON M20) 20 MEQ tablet Take 1 tablet (20 mEq total) by mouth 2 (two) times daily. 08/22/15  Yes Shanker Kristeen Mans, MD  saccharomyces boulardii (FLORASTOR) 250 MG capsule Take 250 mg by mouth 2 (two) times daily.   Yes Historical Provider, MD  sildenafil (REVATIO) 20 MG tablet Take 1 tablet (20 mg total) by mouth 3 (three) times daily. 08/22/15  Yes Shanker Kristeen Mans, MD  tamoxifen (NOLVADEX) 20 MG tablet Take 1 tablet (20 mg total) by mouth daily. 09/09/14  Yes Nicholas Lose, MD  torsemide (DEMADEX) 20 MG tablet Take 80 mg by mouth 2 (two) times daily.   Yes Historical Provider, MD  venlafaxine XR (EFFEXOR-XR) 75 MG 24 hr capsule Take 225 mg by mouth daily with breakfast.    Yes Historical Provider, MD   Allergies  Allergen Reactions  . Erythromycin Hives    Oramycin    Review of Systems  Unable to perform  ROS   Physical Exam  Constitutional: He appears well-developed and well-nourished. He appears lethargic. He has a sickly appearance.  HENT:  Head: Normocephalic and atraumatic.  Cardiovascular: An irregular rhythm present.  Respiratory: Effort normal. No accessory muscle usage. No tachypnea. No respiratory distress. He has decreased breath sounds.  Shallow breathes  GI: Soft. Normal appearance. There is no tenderness.  Musculoskeletal:  + asterixis   Neurological: He appears lethargic. He is disoriented.    Vital Signs: BP 115/59 mmHg  Pulse 70  Temp(Src) 97.7 F (36.5 C) (Oral)  Resp 18  Ht 5' 9"  (1.753 m)  Wt 115.4 kg (254 lb 6.6 oz)  BMI 37.55 kg/m2  SpO2 98%  SpO2: SpO2: 98 % O2 Device:SpO2: 98 % O2 Flow Rate: .O2 Flow Rate (L/min): 2 L/min  IO: Intake/output summary:  Intake/Output Summary (Last 24 hours) at 10/20/15 1057 Last data filed at 10/20/15 0600  Gross per 24 hour  Intake   1006 ml  Output   1225 ml  Net   -219 ml    LBM: Last BM Date: 10/18/15 Baseline Weight: Weight: 118.933 kg (262 lb 3.2  oz) Most recent weight: Weight: 115.4 kg (254 lb 6.6 oz)      Palliative Assessment/Data:  Flowsheet Rows        Most Recent Value   Intake Tab    Referral Department  Cardiology   Unit at Time of Referral  Cardiac/Telemetry Unit   Palliative Care Primary Diagnosis  Cardiac   Date Notified  10/19/15   Palliative Care Type  New Palliative care   Reason for referral  Clarify Goals of Care   Date of Admission  10/16/15   Date first seen by Palliative Care  10/20/15   # of days Palliative referral response time  1 Day(s)   # of days IP prior to Palliative referral  3   Clinical Assessment    Psychosocial & Spiritual Assessment    Palliative Care Outcomes       Additional Data Reviewed:  CBC:    Component Value Date/Time   WBC 6.6 10/20/2015 0500   WBC 6.6 10/22/2014 1407   HGB 9.8* 10/20/2015 0500   HGB 11.7* 10/22/2014 1407   HCT 36.8* 10/20/2015  0500   HCT 37.9* 10/22/2014 1407   PLT 127* 10/20/2015 0500   PLT 105* 10/22/2014 1407   MCV 105.7* 10/20/2015 0500   MCV 96.8 10/22/2014 1407   NEUTROABS 3.6 10/16/2015 1330   NEUTROABS 3.5 10/22/2014 1407   LYMPHSABS 0.8 10/16/2015 1330   LYMPHSABS 1.9 10/22/2014 1407   MONOABS 0.7 10/16/2015 1330   MONOABS 0.6 10/22/2014 1407   EOSABS 0.3 10/16/2015 1330   EOSABS 0.6* 10/22/2014 1407   BASOSABS 0.0 10/16/2015 1330   BASOSABS 0.1 10/22/2014 1407   Comprehensive Metabolic Panel:    Component Value Date/Time   NA 139 10/20/2015 0500   NA 138 10/22/2014 1407   K 3.3* 10/20/2015 0500   K 3.9 10/22/2014 1407   CL 87* 10/20/2015 0500   CO2 40* 10/20/2015 0500   CO2 30* 10/22/2014 1407   BUN 55* 10/20/2015 0500   BUN 55.3* 10/22/2014 1407   CREATININE 2.04* 10/20/2015 0500   CREATININE 1.34* 07/22/2015 1411   CREATININE 1.4* 10/22/2014 1407   GLUCOSE 160* 10/20/2015 0500   GLUCOSE 130 10/22/2014 1407   CALCIUM 8.5* 10/20/2015 0500   CALCIUM 8.6 10/22/2014 1407   AST 16 10/16/2015 1330   AST 16 10/22/2014 1407   ALT 8* 10/16/2015 1330   ALT 6 10/22/2014 1407   ALKPHOS 56 10/16/2015 1330   ALKPHOS 68 10/22/2014 1407   BILITOT 0.5 10/16/2015 1330   BILITOT 0.46 10/22/2014 1407   PROT 6.6 10/16/2015 1330   PROT 6.6 10/22/2014 1407   ALBUMIN 2.8* 10/16/2015 1330   ALBUMIN 3.2* 10/22/2014 1407     Time In: 1240 Time Out: 1400 Time Total: 28mn Greater than 50%  of this time was spent counseling and coordinating care related to the above assessment and plan.  Signed by: PPershing Proud NP  APershing Proud NP  18/10/8297 10:57 AM  Please contact Palliative Medicine Team phone at [phone] questions and concerns.

## 2015-10-20 NOTE — Progress Notes (Signed)
UR Completed Samreet Edenfield Graves-Bigelow, RN,BSN [phone]  

## 2015-10-20 NOTE — Consult Note (Signed)
WOC consulted for perineal maceration, reviewed the chart. Nursing has implemented barrier cream to the affected areas appropriately over the weekend.  Additionally patient with intertriginous skin damage from moisture under the pannus. Silver antimicrobial wicking fabric ordered appropriately for the patient over the weekend and has been implemented per the bedside nurse.  No other needs identified at this time.   Discussed POC with bedside nurse.  Re consult if needed, will not follow at this time. Thanks  [name], [place] [phone])

## 2015-10-20 NOTE — Care Management Note (Signed)
Initial Note Started By: Marvetta Gibbons RN, BSN Unit 2W-Case Manager [phone] Covering 3W  Patient Details  Name: [name] MRN: [id number] Date of Birth: [birth year]  Subjective/Objective: Pt admitted with Pl. Effusion/CHF- ?PNA    Action/Plan: PTA pt Currently at [facility].  (prior to STSNF- lived at home- has daughter Darrold Junker # [phone]- DME at home includes RW and 3n1, he also had home 02. Pt is following by the heart clinic for CHF- Has been active with [facility] in the past)  Expected Discharge Date:     Expected Discharge Plan:  Skilled Facility  In-House Referral:    Discharge planning Services    Post Acute Care Choice:   Choice offered to:    DME Arranged:   DME Agency:    HH Arranged:   [place] Agency:    Status of Service:    Medicare Important Message Given:   Date Medicare IM Given:   Medicare IM give by:   Date Additional Medicare IM Given:   Additional Medicare Important Message give by:    If discussed at [place] of Stay Meetings, dates discussed:   Additional Comments: 1147 10-20-15 Jacqlyn Krauss, RN,BSN [phone] Pt currently continues on IV lasix gtt. Pt is from [facility]. Palliative Care is following. CM will continue to monitor for additional needs.

## 2015-10-20 NOTE — Progress Notes (Signed)
Pt refuse neb, is is stable at this time. Pt is 97% on 3l O2. No wheezing noted. No distress noted

## 2015-10-20 NOTE — Progress Notes (Signed)
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 The patient was seen at the heart failure clinic on 10/16/2015 for worsening lower extremity edema. He was noted to be lethargic with oxygen saturation of 79%. He was sent to the emergency department for further evaluation. Assessment/Plan: Acute on chronic diastolic CHF/cor pulmonale-right heart failure -The patient was previously admitted July 2015 with acute on chronic diastolic CHF and developed cardiorenal syndrome requiring temporary dialysis -August 2015 Myoview negative for ischemia -At skilled nursing facility, patient's torsemide was increased to 80 mg twice a day on 09/26/2015 -Metolazone was given 1/2, 1/3, 1/4 -08/22/2015 discharge weight 224 pounds -10/17/2015 weight 262 -10/19/15-->transition to lasix drip per cardiology -I/Os--still positive balance -08/16/2015 echo EF 55-60%, PAP 82 -continue sildafenil Acute on chronic respiratory failure with hypoxia and hypercarbia -Multifactorial including pulmonary hypertension, CHF, COPD, aspiration pneumonia, and likely underlying OHS/OSA -continue sildafenil -wean oxygen to keep saturation 90-94% TO AVOID HYPERCARBIA -stable on 1 to 2L [place]  Pulmonary infiltrates/consolidation/aspiration pneumonia -Concern about aspiration--pt likely chronically aspirating  -Chek procalcitonin--<0.10 -speech therapy has been consulted-->regular diet with thin -d/c vancomycin -d/c zosyn-->unasyn  Acute encephalopathy/hepatic encephalopathy -Multifactorial including hyperammonemia, hypercarbia,  hypoxia, pneumonia -Last used alcohol greater than 4 months ago -Suspect underlying cognitive impairment -check B12 -TSH -ammonia 53-->45-->52 -10/18/14--mental status without much change; missing occasional doses of lactulose due to somnolence -give lactulose enema x 1  Hyperammonemia -Likely due to chronic hepatic congestion secondary to his pulmonary hypertension -08/12/2015 ammonia 18 -10/16/2015 ammonia 45 -Continue lactulose to 30 grams every 8 hours--2 BM in past 24 hours -lactulose enema x 1  Chronic Atrial fibrillation -Not felt to be a candidate for anticoagulation secondary to poor compliance and continued alcohol use -Rate controlled -Continue bisoprolol -CHADSVASc = 6 -Continue aspirin  Thrombocytopenia -Likely due to underlying cirrhosis/chronic hepatic congestion -Chronic -Monitor for objective signs of bleeding  Diabetes mellitus type 2  -08/17/2015 hemoglobin A1c 6.8  -Previous problems with hypoglycemia on oral agents  -NovoLog sliding scale  -CBGs controlled -10/18/15--A1c--7.3  CAD status post CABG:  -Continue aspirin, statins and beta blockers  Hypertension -Continue bisoprolol, hydralazine, Imdur  History of right breast cancer: Status post mastectomy, continue tamoxifen.Follows with Dr Lindi Adie History of enterocutaneous fistula:  -Has post exploratory laparotomy and bowel resection in August 2016. Chronic small ulceration in the anterior abdominal wall that appears stable History of stroke  -October 2015  -continue ASA  Goals of Care -appreciate palliative consult--family working through comfort care but continue all medications for now pending continued conversations with palliative medicine service   Family Communication: Updated daughter on phone 10/19/15 Disposition Plan: SNF when medically stable    Procedures/Studies: Dg Chest 2 View  10/16/2015  CLINICAL DATA:  Hypoxia for several days. Confusion. History of breast  carcinoma EXAM: CHEST  2 VIEW COMPARISON:  August 15, 2015 FINDINGS: There is patchy airspace consolidation in both lung bases. There are small pleural effusions as well. The heart is mildly enlarged with pulmonary vascularity within normal limits. Patient is status post coronary artery bypass grafting. No adenopathy. There is degenerative change in the thoracic spine. IMPRESSION: Bibasilar airspace consolidation.  Suspect pneumonia, although alveolar edema could present in this manner. There may be both congestive heart failure and pneumonia present. Note that there are small pleural effusions bilaterally with cardiomegaly. Electronically Signed   By: Lowella Grip III M.D.   On: 10/16/2015 14:34   Dg Chest Port 1 View  10/17/2015  CLINICAL DATA:  PICC placement. EXAM: PORTABLE CHEST 1 VIEW COMPARISON:  PA and lateral chest 10/16/2015. FINDINGS: A new right PICC is in place with the tip projecting in the mid to lower superior vena cava. Bibasilar airspace disease persists without marked change. No pneumothorax. There is cardiomegaly. IMPRESSION: Tip of right PICC projects in good position. No change in bilateral airspace disease. Electronically Signed   By: Inge Rise M.D.   On: 10/17/2015 11:33         Subjective: Patient is awake but pleasantly confused. No reports of vomiting, diarrhea, respiratory distress. On one bowel movement today.  Objective: Filed Vitals:   10/20/15 0600 10/20/15 0752 10/20/15 0930 10/20/15 1553  BP:    114/70  Pulse: 70     Temp:  97.7 F (36.5 C)    TempSrc:  Oral    Resp: 18     Height:      Weight: 115.4 kg (254 lb 6.6 oz)     SpO2: 95%  98%     Intake/Output Summary (Last 24 hours) at 10/20/15 1634 Last data filed at 10/20/15 1300  Gross per 24 hour  Intake   1066 ml  Output   1675 ml  Net   -609 ml   Weight change: -1.1 kg (-2 lb 6.8 oz) Exam:   General:  Pt is alert, follows commands appropriately, not in acute distress  HEENT: No  icterus, No thrush, No neck mass, [place]/AT  Cardiovascular: IRRR, S1/S2, no rubs, no gallops  Respiratory: Bibasilar crackles. No wheeze. Good air movement.  Abdomen: Soft/+BS, non tender, non distended, no guarding  Extremities:  2+LE edema, No lymphangitis, No petechiae, No rashes, no synovitis  Data Reviewed: Basic Metabolic Panel:  Recent Labs Lab 10/16/15 1330 10/17/15 0524 10/17/15 1240 10/18/15 0530 10/19/15 0407 10/20/15 0500  NA 139 141  --  140 140 139  K 3.5 2.8*  --  3.4* 3.3* 3.3*  CL 90* 90*  --  89* 87* 87*  CO2 42* 42*  --  43* 40* 40*  GLUCOSE 143* 129*  --  90 130* 160*  BUN 54* 52*  --  51* 54* 55*  CREATININE 1.55* 1.44*  --  1.47* 1.80* 2.04*  CALCIUM 9.0 9.0  --  8.9 8.9 8.5*  MG  --   --  1.7  --  1.8  --    Liver Function Tests:  Recent Labs Lab 10/16/15 1330  AST 16  ALT 8*  ALKPHOS 56  BILITOT 0.5  PROT 6.6  ALBUMIN 2.8*   No results for input(s): LIPASE, AMYLASE in the last 168 hours.  Recent Labs Lab 10/16/15 1330 10/18/15 0530 10/19/15 0407 10/20/15 0500  AMMONIA 45* 53* 43* 52*   CBC:  Recent Labs Lab 10/16/15 1330 10/17/15 0524 10/18/15 0530 10/19/15 0407 10/20/15 0500  WBC 5.4 5.6 5.0 5.9 6.6  NEUTROABS 3.6  --   --   --   --   HGB 9.5* 10.0* 9.7* 9.9* 9.8*  HCT 32.5* 35.3* 33.4* 35.9* 36.8*  MCV 101.9* 101.1* 102.5* 104.4* 105.7*  PLT 125* 139* 118* 107* 127*   Cardiac Enzymes: No results for input(s): CKTOTAL, CKMB,  CKMBINDEX, TROPONINI in the last 168 hours. BNP: Invalid input(s): POCBNP CBG:  Recent Labs Lab 10/19/15 1130 10/19/15 1645 10/19/15 2108 10/20/15 0751 10/20/15 1137  GLUCAP 102* 135* 146* 99 95    Recent Results (from the past 240 hour(s))  Blood Culture (routine x 2)     Status: None (Preliminary result)   Collection Time: 10/16/15  1:30 PM  Result Value Ref Range Status   Specimen Description BLOOD LEFT FOREARM  Final   Special Requests BOTTLES DRAWN AEROBIC AND ANAEROBIC 4CC  Final    Culture NO GROWTH 4 DAYS  Final   Report Status PENDING  Incomplete  Urine culture     Status: None   Collection Time: 10/16/15  2:35 PM  Result Value Ref Range Status   Specimen Description URINE, RANDOM  Final   Special Requests NONE  Final   Culture 40,000 COLONIES/ml ESCHERICHIA COLI  Final   Report Status 10/19/2015 FINAL  Final   Organism ID, Bacteria ESCHERICHIA COLI  Final      Susceptibility   Escherichia coli - MIC*    AMPICILLIN >=32 RESISTANT Resistant     CEFAZOLIN >=64 RESISTANT Resistant     CEFTRIAXONE <=1 SENSITIVE Sensitive     CIPROFLOXACIN >=4 RESISTANT Resistant     GENTAMICIN <=1 SENSITIVE Sensitive     IMIPENEM <=0.25 SENSITIVE Sensitive     NITROFURANTOIN <=16 SENSITIVE Sensitive     TRIMETH/SULFA <=20 SENSITIVE Sensitive     AMPICILLIN/SULBACTAM >=32 RESISTANT Resistant     PIP/TAZO >=128 RESISTANT Resistant     * 40,000 COLONIES/ml ESCHERICHIA COLI     Scheduled Meds: . ampicillin-sulbactam (UNASYN) IV  1.5 g Intravenous Q6H  . antiseptic oral rinse  7 mL Mouth Rinse BID  . aspirin EC  325 mg Oral Daily  . atorvastatin  20 mg Oral QHS  . bisoprolol  2.5 mg Oral Daily  . enoxaparin (LOVENOX) injection  60 mg Subcutaneous Q24H  . folic acid  1 mg Oral Daily  . gabapentin  300 mg Oral BID  . hydrALAZINE  12.5 mg Oral TID  . insulin aspart  0-5 Units Subcutaneous QHS  . insulin aspart  0-9 Units Subcutaneous TID WC  . insulin glargine  6 Units Subcutaneous QHS  . ipratropium-albuterol  3 mL Nebulization BID  . lactulose  30 g Oral TID  . lactulose  300 mL Rectal Once  . levothyroxine  187 mcg Oral QAC breakfast  . pantoprazole  40 mg Oral Daily  . potassium chloride  20 mEq Oral BID  . sildenafil  20 mg Oral TID  . sodium chloride  10-40 mL Intracatheter Q12H  . sodium chloride  3 mL Intravenous Q12H  . tamoxifen  20 mg Oral Daily  . venlafaxine XR  225 mg Oral Q breakfast   Continuous Infusions:    Kaleia Longhi, DO  [facility] Pager [phone]  If 7PM-7AM, please contact night-coverage [email or web address] Password [email or web address] 10/20/2015, 4:34 PM   LOS: 4 days

## 2015-10-21 LAB — GLUCOSE, CAPILLARY
Glucose-Capillary: 90 mg/dL (ref 65–99)
Glucose-Capillary: 142 mg/dL — ABNORMAL HIGH (ref 65–99)
Glucose-Capillary: 121 mg/dL — ABNORMAL HIGH (ref 65–99)
Glucose-Capillary: 135 mg/dL — ABNORMAL HIGH (ref 65–99)

## 2015-10-21 LAB — BASIC METABOLIC PANEL
Anion gap: 9 (ref 5–15)
BUN: 54 mg/dL — AB (ref 6–20)
CHLORIDE: 91 mmol/L — AB (ref 101–111)
CO2: 42 mmol/L — ABNORMAL HIGH (ref 22–32)
CREATININE: 1.67 mg/dL — AB (ref 0.61–1.24)
Calcium: 8.4 mg/dL — ABNORMAL LOW (ref 8.9–10.3)
GFR calc Af Amer: 46 mL/min — ABNORMAL LOW (ref >60)
GFR calc non Af Amer: 40 mL/min — ABNORMAL LOW (ref >60)
GLUCOSE: 100 mg/dL — AB (ref 65–99)
POTASSIUM: 2.8 mmol/L — AB (ref 3.5–5.1)
Sodium: 142 mmol/L (ref 135–145)

## 2015-10-21 LAB — CARBOXYHEMOGLOBIN
CARBOXYHEMOGLOBIN: 2.6 % — AB (ref 0.5–1.5)
Methemoglobin: 0.6 % (ref 0.0–1.5)
O2 SAT: 69.2 %
TOTAL HEMOGLOBIN: 10 g/dL — AB (ref 13.5–18.0)

## 2015-10-21 LAB — CBC
HEMATOCRIT: 34.3 % — AB (ref 39.0–52.0)
Hemoglobin: 9.6 g/dL — ABNORMAL LOW (ref 13.0–17.0)
MCH: 28.9 pg (ref 26.0–34.0)
MCHC: 28 g/dL — AB (ref 30.0–36.0)
MCV: 103.3 fL — AB (ref 78.0–100.0)
Platelets: 97 10*3/uL — ABNORMAL LOW (ref 150–400)
RBC: 3.32 MIL/uL — ABNORMAL LOW (ref 4.22–5.81)
RDW: 17.4 % — AB (ref 11.5–15.5)
WBC: 5.5 10*3/uL (ref 4.0–10.5)

## 2015-10-21 LAB — CULTURE, BLOOD (ROUTINE X 2): CULTURE: NO GROWTH

## 2015-10-21 LAB — AMMONIA: Ammonia: 44 umol/L — ABNORMAL HIGH (ref 9–35)

## 2015-10-21 LAB — PROCALCITONIN: PROCALCITONIN: 0.26 ng/mL

## 2015-10-21 MED ORDER — POTASSIUM CHLORIDE CRYS ER 20 MEQ PO TBCR
40.0000 meq | EXTENDED_RELEASE_TABLET | Freq: Four times a day (QID) | ORAL | Status: AC
Start: 2015-10-21 — End: 2015-10-21
  Administered 2015-10-21 (×2): 40 meq via ORAL
  Filled 2015-10-21 (×2): qty 2

## 2015-10-21 MED ORDER — TORSEMIDE 20 MG PO TABS
80.0000 mg | ORAL_TABLET | Freq: Two times a day (BID) | ORAL | Status: DC
  Administered 2015-10-21 – 2015-10-23 (×5): 80 mg via ORAL
  Filled 2015-10-21 (×5): qty 4

## 2015-10-21 MED ORDER — LACTULOSE 10 GM/15ML PO SOLN: 30.0000 g | Freq: Three times a day (TID) | ORAL | Status: DC

## 2015-10-21 NOTE — Progress Notes (Signed)
Daily Progress Note   Patient Name: [name]       Date: 10/21/2015 DOB: [birth year]  Age: 70 y.o. MRN#: [id number] Attending Physician: Orson Eva, MD Primary Care Physician: Jene Every, MD Admit Date: 10/16/2015  Reason for Consultation/Follow-up: Establishing goals of care  Subjective: [name] is much unchanged from yesterday. Unfortunately still difficult balance between diuresis and renal function and encephalopathy continuing with no improvement. Daughter [name] did have a good hour with him where she brought his granddaughter who he loves. [name] is very tearful that he is at end of life and the thought of being without her father - she is an only child and her mother has already died and she feels she is all that would be left of her family. She also says that [name] sister went to [facility] today and this is where she would want for him to go as well. However she would like for me to talk with [name] about this. I attempted to discuss and he seems to understand somewhat and asks about cost and if insurance pays but then began talking of other things. He did agree to hospice but unsure how much he really understands. [name] says that her father was telling her husband "make sure you take care of her" and other comments that makes me believe that he is somewhat aware he is at end of life. CSW consulted to assist with possible transfer to hospice tomorrow.    Length of Stay: 5 days  Current Medications: Scheduled Meds:  . amoxicillin-clavulanate  1 tablet Oral BID  . antiseptic oral rinse  7 mL Mouth Rinse BID  . aspirin EC  325 mg Oral Daily  . atorvastatin  20 mg Oral QHS  . bisoprolol  2.5 mg Oral Daily  . enoxaparin (LOVENOX) injection  60 mg Subcutaneous Q24H    . folic acid  1 mg Oral Daily  . gabapentin  300 mg Oral BID  . hydrALAZINE  12.5 mg Oral TID  . insulin aspart  0-5 Units Subcutaneous QHS  . insulin aspart  0-9 Units Subcutaneous TID WC  . insulin glargine  6 Units Subcutaneous QHS  . ipratropium-albuterol  3 mL Nebulization BID  . lactulose  30 g Oral TID  . lactulose  300 mL Rectal Once  .  levothyroxine  187 mcg Oral QAC breakfast  . pantoprazole  40 mg Oral Daily  . potassium chloride  20 mEq Oral BID  . potassium chloride  40 mEq Oral Q6H  . sildenafil  20 mg Oral TID  . sodium chloride  10-40 mL Intracatheter Q12H  . sodium chloride  3 mL Intravenous Q12H  . tamoxifen  20 mg Oral Daily  . torsemide  80 mg Oral BID  . venlafaxine XR  225 mg Oral Q breakfast    Continuous Infusions:    PRN Meds: sodium chloride, acetaminophen, albuterol, fentaNYL (SUBLIMAZE) injection, ondansetron (ZOFRAN) IV, oxyCODONE, sodium chloride, sodium chloride  Physical Exam: Physical Exam  Constitutional: He appears well-developed and well-nourished. He appears lethargic. He has a sickly appearance.  HENT:  Head: Normocephalic and atraumatic.  Cardiovascular: An irregular rhythm present.  Respiratory: Effort normal. No accessory muscle usage. No tachypnea. No respiratory distress. He has decreased breath sounds.  Shallow breathes  GI: Soft. Normal appearance. There is no tenderness.  Musculoskeletal:  + asterixis  Neurological: He appears lethargic. He is disoriented.   Vital Signs: BP 139/81 mmHg  Pulse 65  Temp(Src) 98 F (36.7 C) (Axillary)  Resp 19  Ht 5\' 9"  (1.753 m)  Wt 114.533 kg (252 lb 8 oz)  BMI 37.27 kg/m2  SpO2 98% SpO2: SpO2: 98 % O2 Device: O2 Device: Nasal Cannula O2 Flow Rate: O2 Flow Rate (L/min): 3 L/min  Intake/output summary:  Intake/Output Summary (Last 24 hours) at 10/21/15 1553 Last data filed at 10/21/15 1400  Gross per 24 hour  Intake   1233 ml  Output   2352 ml  Net  -1119 ml   LBM: Last BM  Date: 10/18/15 Baseline Weight: Weight: 118.933 kg (262 lb 3.2 oz) Most recent weight: Weight: 114.533 kg (252 lb 8 oz)       Palliative Assessment/Data: Flowsheet Rows        Most Recent Value   Intake Tab    Referral Department  Cardiology   Unit at Time of Referral  Cardiac/Telemetry Unit   Palliative Care Primary Diagnosis  Cardiac   Date Notified  10/19/15   Palliative Care Type  New Palliative care   Reason for referral  Clarify Goals of Care   Date of Admission  10/16/15   Date first seen by Palliative Care  10/20/15   # of days Palliative referral response time  1 Day(s)   # of days IP prior to Palliative referral  3   Clinical Assessment    Psychosocial & Spiritual Assessment    Palliative Care Outcomes       Additional Data Reviewed: CBC    Component Value Date/Time   WBC 5.5 10/21/2015 0521   WBC 6.6 10/22/2014 1407   RBC 3.32* 10/21/2015 0521   RBC 3.91* 10/22/2014 1407   RBC 3.20* 09/07/2012 0500   HGB 9.6* 10/21/2015 0521   HGB 11.7* 10/22/2014 1407   HCT 34.3* 10/21/2015 0521   HCT 37.9* 10/22/2014 1407   PLT 97* 10/21/2015 0521   PLT 105* 10/22/2014 1407   MCV 103.3* 10/21/2015 0521   MCV 96.8 10/22/2014 1407   MCH 28.9 10/21/2015 0521   MCH 30.0 10/22/2014 1407   MCHC 28.0* 10/21/2015 0521   MCHC 31.0* 10/22/2014 1407   RDW 17.4* 10/21/2015 0521   RDW 18.3* 10/22/2014 1407   LYMPHSABS 0.8 10/16/2015 1330   LYMPHSABS 1.9 10/22/2014 1407   MONOABS 0.7 10/16/2015 1330   MONOABS 0.6 10/22/2014 1407  EOSABS 0.3 10/16/2015 1330   EOSABS 0.6* 10/22/2014 1407   BASOSABS 0.0 10/16/2015 1330   BASOSABS 0.1 10/22/2014 1407    CMP     Component Value Date/Time   NA 142 10/21/2015 0521   NA 138 10/22/2014 1407   K 2.8* 10/21/2015 0521   K 3.9 10/22/2014 1407   CL 91* 10/21/2015 0521   CO2 42* 10/21/2015 0521   CO2 30* 10/22/2014 1407   GLUCOSE 100* 10/21/2015 0521   GLUCOSE 130 10/22/2014 1407   BUN 54* 10/21/2015 0521   BUN 55.3* 10/22/2014  1407   CREATININE 1.67* 10/21/2015 0521   CREATININE 1.34* 07/22/2015 1411   CREATININE 1.4* 10/22/2014 1407   CALCIUM 8.4* 10/21/2015 0521   CALCIUM 8.6 10/22/2014 1407   PROT 6.6 10/16/2015 1330   PROT 6.6 10/22/2014 1407   ALBUMIN 2.8* 10/16/2015 1330   ALBUMIN 3.2* 10/22/2014 1407   AST 16 10/16/2015 1330   AST 16 10/22/2014 1407   ALT 8* 10/16/2015 1330   ALT 6 10/22/2014 1407   ALKPHOS 56 10/16/2015 1330   ALKPHOS 68 10/22/2014 1407   BILITOT 0.5 10/16/2015 1330   BILITOT 0.46 10/22/2014 1407   GFRNONAA 40* 10/21/2015 0521   GFRAA 46* 10/21/2015 0521       Problem List:  Patient Active Problem List   Diagnosis Date Noted  . Palliative care encounter 10/20/2015  . Acute on chronic respiratory failure with hypoxia ([place]) 10/17/2015  . Aspiration pneumonia ([place]) 10/17/2015  . Hyperammonemia ([place]) 10/17/2015  . Asterixis 10/16/2015  . Pleural effusion 10/16/2015  . Fluid overload 10/16/2015  . HCAP (healthcare-associated pneumonia) 10/11/2015  . Dyslipidemia with low high density lipoprotein (HDL) cholesterol with hypertriglyceridemia due to type 2 diabetes mellitus ([place]) 08/25/2015  . Persistent atrial fibrillation ([place])   . Hypoxia   . Pulmonary hypertension ([place])   . ARF (acute renal failure) ([place])   . Acute on chronic diastolic heart failure ([place])   . Faintness   . Stroke (cerebrum) ([place])   . Acute encephalopathy 08/12/2015  . Hypoxemia requiring supplemental oxygen 08/11/2015  . A-fib ([place]) 08/11/2015  . Acute respiratory failure with hypoxia ([place]) 08/11/2015  . Sepsis ([place]) 07/01/2015  . Sepsis due to cellulitis ([place]) 07/01/2015  . SOB (shortness of breath)   . Acute on chronic diastolic CHF (congestive heart failure), NYHA class 3 ([place])   . Encephalopathy, metabolic   . Hypomagnesemia 05/27/2015  . History of male breast cancer, s/p R mastectomy Jan 2016 05/27/2015  . General weakness 05/27/2015  . Generalized weakness 05/26/2015  . Hypokalemia  10/03/2014  . Congestive heart failure ([place]) 10/02/2014  . Lower extremity pain, left 10/02/2014  . Diabetes mellitus type 2, controlled ([place]) 10/02/2014  . Enterocutaneous fistula   . Hypertension 08/30/2014  . COPD (chronic obstructive pulmonary disease) ([place]) 08/30/2014  . Intracerebral hemorrhage ([place]) 08/01/2014  . Osteoarthritis of both hands 07/30/2014  . Thrush, oral 07/28/2014  . Debility 07/25/2014  . Diabetic polyneuropathy associated with type 2 diabetes mellitus ([place]) 07/25/2014  . Gout flare 07/24/2014  . Pre-operative cardiovascular examination 07/15/2014  . Family history of malignant neoplasm of gastrointestinal tract 06/12/2014  . Panniculitis 04/14/2013  . Seasonal allergies 12/20/2012  . L1 vertebral fracture ([place]) 09/07/2012  . Hypothyroidism 08/30/2012  . ETOH abuse   . PAF (paroxysmal atrial fibrillation) ([place])   . Chronic diastolic heart failure ([place])   . DM (diabetes mellitus) type II uncontrolled, periph vascular disorder ([place]) 06/20/2009  . Obstructive sleep apnea 06/20/2009  .  CAROTID STENOSIS 06/20/2009  . CHRONIC OBSTRUCTIVE PULMONARY DISEASE 06/20/2009  . Coronary atherosclerosis of native coronary artery 06/20/2009  . Hyperlipidemia   . Hypertensive heart disease      Palliative Care Assessment & Plan    1.Code Status:  DNR    Code Status Orders        Start     Ordered   10/18/15 [id number]  Do not attempt resuscitation (DNR)   Continuous    Question Answer Comment  In the event of cardiac or respiratory ARREST Do not call a "code blue"   In the event of cardiac or respiratory ARREST Do not perform Intubation, CPR, defibrillation or ACLS   In the event of cardiac or respiratory ARREST Use medication by any route, position, wound care, and other measures to relive pain and suffering. May use oxygen, suction and manual treatment of airway obstruction as needed for comfort.      10/18/15 1052    Code Status History    Date Active Date  Inactive Code Status Order ID Comments User Context   10/16/2015  6:24 PM 10/18/2015 10:52 AM Full Code [id number]  Hosie Poisson, MD Inpatient   08/15/2015  3:34 PM 08/22/2015  3:58 PM Full Code [id number]  Jettie Booze, MD Inpatient   08/12/2015  1:45 AM 08/15/2015  3:34 PM Full Code [id number]  Rise Patience, MD Inpatient   07/01/2015 10:07 PM 07/07/2015  4:51 PM Full Code [id number]  Reubin Milan, MD Inpatient   06/09/2015  6:39 PM 06/09/2015  6:39 PM Full Code [id number]  Bary Leriche, PA-C Inpatient   06/09/2015  6:39 PM 06/18/2015  4:01 PM Full Code [id number]  Bary Leriche, PA-C Inpatient   06/02/2015  7:08 PM 06/09/2015  6:39 PM Full Code [id number]  Coralie Keens, MD Inpatient   05/27/2015  1:55 AM 05/29/2015  4:59 PM DNR [id number]  Roney Jaffe, MD Inpatient   11/11/2014  1:16 PM 11/12/2014  6:07 PM Full Code [id number]  Excell Seltzer, MD Inpatient   08/31/2014  1:56 AM 09/01/2014  6:09 PM Full Code [id number]  Allyne Gee, MD Inpatient   07/24/2014  5:43 PM 08/02/2014  4:42 PM Full Code [id number]  Bary Leriche, PA-C Inpatient   07/21/2014  8:33 PM 07/24/2014  5:43 PM Full Code [id number]  Cordelia Poche, MD Inpatient   04/22/2014  7:37 PM 04/29/2014  7:06 PM Full Code [id number]  Arne Cleveland, MD Inpatient   04/21/2014 12:54 AM 04/22/2014  7:37 PM Full Code [id number]  Lamar Sprinkles, MD ED   01/28/2014  5:01 AM 02/05/2014  7:43 PM Full Code [id number]  Berle Mull, MD Inpatient   12/26/2012  2:48 AM 01/01/2013  4:48 PM Full Code [id number]  Edward Jolly, MD Inpatient   10/05/2011  7:39 PM 10/16/2011  2:27 PM Full Code [id number]  Doree Fudge, MD Inpatient       2. Goals of Care/Additional Recommendations:  Goal is for comfort and that he does not suffer.   Limitations on Scope of Treatment: Full Comfort Care  Desire for further Chaplaincy support:yes  Psycho-social Needs: Caregiving  Support/Resources and Education on Hospice  3. Symptom Management:      1. Continue  torsemide for comfort. Continue lactulose to help him be alert, if able, to share more moments with family as he did today.      2. Fentanyl prn pain/dyspnea.   4. Palliative Prophylaxis:   Frequent Pain Assessment  5. Prognosis: < 2  weeks  6. Discharge Planning:  Hospice facility   Care plan was discussed with Dr. Carles Collet,   Thank you for allowing the Palliative Medicine Team to assist in the care of this patient.   Time In: 1510 Time Out: 1620 Total Time 64min Prolonged Time Billed  yes         Pershing Proud, NP  [id number], 3:53 PM  Please contact Palliative Medicine Team phone at [phone] for questions and concerns.

## 2015-10-21 NOTE — Plan of Care (Signed)
Problem: Skin Integrity: Goal: Risk for impaired skin integrity will decrease Outcome: Progressing Pt being turned Q2 hrs, MASD on sacrum and peritoneal folds, both improving in comparison to 10/19/15. Inter-dry applied.

## 2015-10-21 NOTE — Progress Notes (Signed)
Advanced Heart Failure Rounding Note   Subjective:    Remains very confused.  Creatinine improved with holding of IV diuretics.  Denies SOB, but very confused and not answering questions clearly. Does not seem to be in acute distress.  CVP 17-18  Weight down 2 lbs.   Objective:   Weight Range:  Vital Signs:   Temp:  [97.7 F (36.5 C)-98.2 F (36.8 C)] 97.7 F (36.5 C) (01/10 0814) Pulse Rate:  [67-78] 77 (01/10 0814) Resp:  [16-21] 18 (01/10 0814) BP: (108-134)/(52-85) 134/85 mmHg (01/10 0814) SpO2:  [85 %-100 %] 99 % (01/10 0814) Weight:  [252 lb 8 oz (114.533 kg)] 252 lb 8 oz (114.533 kg) (01/10 0814) Last BM Date: 10/18/15  Weight change: Filed Weights   10/19/15 0400 10/20/15 0600 10/21/15 0814  Weight: 256 lb 13.4 oz (116.5 kg) 254 lb 6.6 oz (115.4 kg) 252 lb 8 oz (114.533 kg)    Intake/Output:   Intake/Output Summary (Last 24 hours) at 10/21/15 0849 Last data filed at 10/21/15 [id number]  Gross per 24 hour  Intake   1233 ml  Output   2100 ml  Net   -867 ml     PHYSICAL EXAM: General: Elderly and chronically ill appearing. In bed. Awake. Alert to person.  Mumbles with occasional, clear, random words when questioned.   HEENT: Normal  Neck: supple. JVP difficult to assess but appears to be up to jaw. Carotids 2+ bilat; no bruits. No thyromegaly or nodule noted. Cor: PMI nondisplaced. Irregular. No M/G/R Lungs: dull at bases R>L  with mild crackles Abdomen: Obese, soft, NT, mildy distended, no HSM. No bruits or masses. +BS  Extremities: no cyanosis, clubbing, rash. R and LLE with compression wraps. 1-2+ edema Neuro: Alert to person, cranial nerves grossly intact. moves all 4 extremities w/o difficulty. Affect pleasant  Telemetry: Reviewed personally,  A fib 80-90s   Labs: Basic Metabolic Panel:  Recent Labs Lab 10/17/15 0524 10/17/15 1240 10/18/15 0530 10/19/15 0407 10/20/15 0500 10/21/15 0521  NA 141  --  140 140 139 142  K 2.8*  --  3.4* 3.3* 3.3*  2.8*  CL 90*  --  89* 87* 87* 91*  CO2 42*  --  43* 40* 40* 42*  GLUCOSE 129*  --  90 130* 160* 100*  BUN 52*  --  51* 54* 55* 54*  CREATININE 1.44*  --  1.47* 1.80* 2.04* 1.67*  CALCIUM 9.0  --  8.9 8.9 8.5* 8.4*  MG  --  1.7  --  1.8  --   --     Liver Function Tests:  Recent Labs Lab 10/16/15 1330  AST 16  ALT 8*  ALKPHOS 56  BILITOT 0.5  PROT 6.6  ALBUMIN 2.8*   No results for input(s): LIPASE, AMYLASE in the last 168 hours.  Recent Labs Lab 10/19/15 0407 10/20/15 0500 10/21/15 0523  AMMONIA 43* 52* 44*    CBC:  Recent Labs Lab 10/16/15 1330 10/17/15 0524 10/18/15 0530 10/19/15 0407 10/20/15 0500 10/21/15 0521  WBC 5.4 5.6 5.0 5.9 6.6 5.5  NEUTROABS 3.6  --   --   --   --   --   HGB 9.5* 10.0* 9.7* 9.9* 9.8* 9.6*  HCT 32.5* 35.3* 33.4* 35.9* 36.8* 34.3*  MCV 101.9* 101.1* 102.5* 104.4* 105.7* 103.3*  PLT 125* 139* 118* 107* 127* 97*    Cardiac Enzymes: No results for input(s): CKTOTAL, CKMB, CKMBINDEX, TROPONINI in the last 168 hours.  BNP: BNP (last  3 results)  Recent Labs  09/03/15 1604 09/25/15 1245 10/16/15 1330  BNP 395.2* 365.1* 851.3*    ProBNP (last 3 results) No results for input(s): PROBNP in the last 8760 hours.    Other results:  Imaging: No results found.   Medications:     Scheduled Medications: . amoxicillin-clavulanate  1 tablet Oral BID  . antiseptic oral rinse  7 mL Mouth Rinse BID  . aspirin EC  325 mg Oral Daily  . atorvastatin  20 mg Oral QHS  . bisoprolol  2.5 mg Oral Daily  . enoxaparin (LOVENOX) injection  60 mg Subcutaneous Q24H  . folic acid  1 mg Oral Daily  . gabapentin  300 mg Oral BID  . hydrALAZINE  12.5 mg Oral TID  . insulin aspart  0-5 Units Subcutaneous QHS  . insulin aspart  0-9 Units Subcutaneous TID WC  . insulin glargine  6 Units Subcutaneous QHS  . ipratropium-albuterol  3 mL Nebulization BID  . lactulose  30 g Oral TID  . lactulose  300 mL Rectal Once  . levothyroxine  187 mcg  Oral QAC breakfast  . pantoprazole  40 mg Oral Daily  . potassium chloride  20 mEq Oral BID  . sildenafil  20 mg Oral TID  . sodium chloride  10-40 mL Intracatheter Q12H  . sodium chloride  3 mL Intravenous Q12H  . tamoxifen  20 mg Oral Daily  . venlafaxine XR  225 mg Oral Q breakfast    Infusions:    PRN Medications: sodium chloride, acetaminophen, albuterol, fentaNYL (SUBLIMAZE) injection, ondansetron (ZOFRAN) IV, oxyCODONE, sodium chloride, sodium chloride   Assessment:   1. A/C Diastolic Heart Failure 2. Acute Respiratory Failure with Hypercarbia 3. H/O Cirrohsis with hepatic encephalopathy--> Elevated ammonia 4. R Pleural Effusion 5. PAH 6. A fib -Chronic 7. Acute on CKD 8. H/O Breast Cancer S/P mastectomy 9. Hypokalemia 10. Acute delirium/lethargy 11. DNR  Plan/Discussion:    Remains markedly volume overloaded. CVP 17-18 Renal function slightly improved with holding lasix gtt.  Will place back on lasix 80 mgIV BID.  Co-ox 69.2% this am. Continue diamox.   On unasyn for possible aspiration. Consider switch to zosyn as unasyn has a high salt load.   AF is chronic. Rate controlled. Not candidate for [facility].   He is a DNR and his delirium seems to be worsening.   Agree with palliative care.  Pt likely has a very poor short term prognosis.   Shirley Friar, PA-C 8:49 AM Advanced Heart Failure Team Pager [phone] (M-F; 7a - 4p)  Please contact [facility] for night-coverage after hours (4p -7a ) and weekends on [email or web address]   Length of Stay: 5  Patient seen and examined with Oda Kilts, PA-C. We discussed all aspects of the encounter. I agree with the assessment and plan as stated above.   Making little or no progress. Creatinine improved with holding lasix gtt and metolazone. Will give lasix 80 IV bid. Appreciate Palliative care consult. Would recommend proceeding with full comfort care.   Bensimhon, Daniel,MD 9:31 AM

## 2015-10-21 NOTE — Clinical Social Work Note (Signed)
Clinical Social Work Assessment  Patient Details  Name: [name] MRN: [id number] Date of Birth: [birth year]  Date of referral:  10/21/15               Reason for consult:  Facility Placement, Discharge Planning                Permission sought to share information with:  Facility Sport and exercise psychologist, Family Supports Permission granted to share information::  No (Patient is confused)  Name::     [name]::  SNFs  Relationship::     Contact Information:     Housing/Transportation Living arrangements for the past 2 months:  [name] of Information:  Adult Children Patient Interpreter Needed:  None Criminal Activity/Legal Involvement Pertinent to Current Situation/Hospitalization:  No - Comment as needed Significant Relationships:  Adult Children Lives with:  Adult Children Do you feel safe going back to the place where you live?  Yes Need for family participation in patient care:  Yes (Comment)  Care giving concerns:  Patient's daughter states that the patient will need to go to a facility at discharge because she cannot manage him at home. She does not want [facility].   Social Worker assessment / plan:  CSW spoke with patient's daughter [name] by phone to complete assessment as patient is confused. [name] states that the patient has been at [facility] and [facility] but she does NOT want the patient to return to this facility. CSW notes that PMT has been speaking with the family. CSW inquired about these conversations, and the daughter seems to be far from making a decision about end of life care at this time. CSW explained SNF search/placement process and answered [name]'s questions in the event the patient does DC to a SNF. CSW also explained that CSW will be available for support and assistance with hospice placement if the daughter decides this is morning appropriate. CSW will follow.  Employment status:  Retired Forensic scientist:  Medicare PT  Recommendations:  [place] / Referral to community resources:  [place]  Patient/Family's Response to care:  Patient's daughter appears happy with the care the patient has received here at [facility]. [name] does not seem happy with the care he received at [facility] and wishes to find a new facility.  Patient/Family's Understanding of and Emotional Response to Diagnosis, Current Treatment, and Prognosis:  Patient's family appears to have a fair understanding of reason for admission and post DC needs.   Emotional Assessment Appearance:  Appears stated age Attitude/Demeanor/Rapport:  Unable to Assess Affect (typically observed):  Unable to Assess (Confused) Orientation:  Oriented to Self Alcohol / Substance use:  Never Used Psych involvement (Current and /or in the community):  No (Comment)  Discharge Needs  Concerns to be addressed:  Discharge Planning Concerns Readmission within the last 30 days:  No Current discharge risk:  Chronically ill, Physical Impairment, Cognitively Impaired Barriers to Discharge:  Continued Medical Work up   [employer] MSW, [place], [place], [id number]

## 2015-10-21 NOTE — Discharge Summary (Signed)
Physician Discharge Summary  [name] [id number] DOB: 12-31-1945 DOA: 10/16/2015  PCP: Jene Every, MD  Admit date: 10/16/2015 Discharge date: 10/22/15 DISCHARGE TO RESIDENTIAL HOSPICE  Discharge Diagnoses:  Acute on chronic diastolic CHF/cor pulmonale-right heart failure -The patient was previously admitted July 2015 with acute on chronic diastolic CHF and developed cardiorenal syndrome requiring temporary dialysis -August 2015 Myoview negative for ischemia -At skilled nursing facility, patient's torsemide was increased to 80 mg twice a day on 09/26/2015 -Metolazone was given 1/2, 1/3, 1/4 -08/22/2015 discharge weight 224 pounds -10/17/2015 weight 262 -10/19/15-->transition to lasix drip per cardiology -10/21/15--restarted torsemide -I/Os--still positive balance  -08/16/2015 echo EF 55-60%, PAP 82 -After discussion with palliative medicine, the patient will be discharged on oral diuretics for a measure of comfort Acute on chronic respiratory failure with hypoxia and hypercarbia -Multifactorial including pulmonary hypertension, CHF, COPD, aspiration pneumonia, and likely underlying OHS/OSA -continue sildafenil -wean oxygen to keep saturation 90-94% TO AVOID HYPERCARBIA -stable on 1 to 2L [place]  Pulmonary infiltrates/consolidation/aspiration pneumonia -Concern about aspiration--pt likely chronically aspirating  -Chek procalcitonin--<0.10 -speech therapy has been consulted-->regular diet with thin -d/c vancomycin -d/c zosyn-->unasyn -10/21/15--unasyn-->augmentin -do not plan to d/c with abx as the patient's focus of care has been transitioned to that of comfort  Acute encephalopathy/hepatic encephalopathy -Multifactorial including hyperammonemia, hypercarbia, hypoxia, pneumonia -Last used alcohol greater than 4 months ago -Suspect underlying cognitive impairment -check B12 -TSH -ammonia 53-->45-->52 -10/18/14--mental status without much change; missing occasional doses of  lactulose due to somnolence -Patient has 2-3 bowel movements daily  Hyperammonemia -Likely due to chronic hepatic congestion secondary to his pulmonary hypertension -08/12/2015 ammonia 18 -10/16/2015 ammonia 45 -Continue lactulose to 30 grams every 8 hours--2 BM in past 24 hours -After discussion with palliative medicine, plan to continue lactulose after discharge to residential hospice  Chronic Atrial fibrillation -Not felt to be a candidate for anticoagulation secondary to poor compliance and continued alcohol use -Rate controlled -Continue bisoprolol -CHADSVASc = 6 -Continue aspirin -No longer active issue as to patient's focus of care care has transitioned to that of comfort  Thrombocytopenia -Likely due to underlying cirrhosis/chronic hepatic congestion -Chronic -Monitor for objective signs of bleeding  Diabetes mellitus type 2  -08/17/2015 hemoglobin A1c 6.8  -Previous problems with hypoglycemia on oral agents  -NovoLog sliding scale  -CBGs controlled -10/18/15--A1c--7.3 -No longer active issue as to patient's focus of care care has transitioned to that of comfort CAD status post CABG:  -Continue aspirin, statins and beta blockers -No longer active issue as to patient's focus of care care has transitioned to that of comfort Hypertension -Continue bisoprolol, hydralazine, Imdur  History of right breast cancer: Status post mastectomy, continue tamoxifen.Follows with Dr Lindi Adie -No longer active issue as to patient's focus of care care has transitioned to that of comfort History of enterocutaneous fistula:  -Has post exploratory laparotomy and bowel resection in August 2016. Chronic small ulceration in the anterior abdominal wall that appears stable History of stroke  -October 2015  -continue ASA  Goals of Care -appreciate palliative consult -Transitioning to focus of care for comfort   Discharge Condition: stable  Disposition:  residential hospice  Diet:  Comfort feeding  Wt Readings from Last 3 Encounters:  10/21/15 114.533 kg (252 lb 8 oz)  10/16/15 116.302 kg (256 lb 6.4 oz)  10/14/15 120.203 kg (265 lb)    History of present illness:  70 year old male with a history of CAD status post CABG 2001, atrial fibrillation not a candidate for [facility] due to Etoh use  and noncompliance, COPD, diastolic CHF, carotid stenosis status post left CEA, hypertension, hyperlipidemia and enterocutaneous fistula as a combination of small bowel resection presented with hypoxemia and confusion. The patient was seen at the heart failure clinic on 10/16/2015 for worsening lower extremity edema. He was noted to be lethargic with oxygen saturation of 79%. He was sent to the emergency department for further evaluation. After admission, the patient was started on intravenous furosemide and ultimately on a furosemide drip. Cardiology was consulted and continued to follow the patient. Unfortunately, the patient did not show clinical improvement. The patient remained encephalopathic although he was a little bit more alert with lactulose. Palliative medicine was consulted. After discussion with the patient's family, they agreed for placement residential hospice. However, diuretics and lactulose will be continued after discharge as per discussion with the patient's family and palliative medicine.  Consultants: Palliative medicine  Discharge Exam: Filed Vitals:   10/21/15 1534 10/21/15 1600  BP: 139/81 121/72  Pulse: 65 66  Temp: 98 F (36.7 C)   Resp: 19 16   Filed Vitals:   10/21/15 1108 10/21/15 1200 10/21/15 1534 10/21/15 1600  BP: 113/58 116/58 139/81 121/72  Pulse: 74 74 65 66  Temp: 98.3 F (36.8 C)  98 F (36.7 C)   TempSrc: Axillary  Axillary   Resp: 17 14 19 16   Height:      Weight:      SpO2: 100% 100% 98% 96%   General: awake and alert NAD, pleasant, cooperative Cardiovascular: IRRR,  Respiratory:Bibasilar rales. No wheezes Abdomen:soft, nontender,  nondistended, positive bowel sounds Extremities: 2+ LE edema, No lymphangitis, no petechiae  Discharge Instructions     Medication List    STOP taking these medications        allopurinol 300 MG tablet  Commonly known as:  ZYLOPRIM     aspirin 325 MG EC tablet     atorvastatin 20 MG tablet  Commonly known as:  LIPITOR     carvedilol 3.125 MG tablet  Commonly known as:  COREG     cetirizine 10 MG tablet  Commonly known as:  ZYRTEC     colchicine 0.6 MG tablet     fluticasone 110 MCG/ACT inhaler  Commonly known as:  FLOVENT HFA     folic acid 1 MG tablet  Commonly known as:  FOLVITE     gabapentin 300 MG capsule  Commonly known as:  NEURONTIN     guaiFENesin 600 MG 12 hr tablet  Commonly known as:  MUCINEX     hydrALAZINE 100 MG tablet  Commonly known as:  APRESOLINE     insulin aspart 100 UNIT/ML injection  Commonly known as:  novoLOG     ipratropium-albuterol 0.5-2.5 (3) MG/3ML Soln  Commonly known as:  DUONEB     isosorbide dinitrate 10 MG tablet  Commonly known as:  ISORDIL     LANTUS SOLOSTAR 100 UNIT/ML Solostar Pen  Generic drug:  Insulin Glargine     levothyroxine 125 MCG tablet  Commonly known as:  SYNTHROID, LEVOTHROID     magnesium oxide 400 (241.3 Mg) MG tablet  Commonly known as:  MAG-OX     omeprazole 20 MG capsule  Commonly known as:  PRILOSEC     Oxycodone HCl 10 MG Tabs     potassium chloride SA 20 MEQ tablet  Commonly known as:  KLOR-CON M20     saccharomyces boulardii 250 MG capsule  Commonly known as:  FLORASTOR     sildenafil 20 MG  tablet  Commonly known as:  REVATIO     tamoxifen 20 MG tablet  Commonly known as:  NOLVADEX     venlafaxine XR 75 MG 24 hr capsule  Commonly known as:  EFFEXOR-XR      TAKE these medications        albuterol (2.5 MG/3ML) 0.083% nebulizer solution  Commonly known as:  PROVENTIL  Take 3 mLs (2.5 mg total) by nebulization every 2 (two) hours as needed for wheezing or shortness of breath.       lactulose 10 GM/15ML solution  Commonly known as:  CHRONULAC  Take 45 mLs (30 g total) by mouth 3 (three) times daily.     metolazone 5 MG tablet  Commonly known as:  ZAROXOLYN  Take 5 mg by mouth every evening.     torsemide 20 MG tablet  Commonly known as:  DEMADEX  Take 80 mg by mouth 2 (two) times daily.         The results of significant diagnostics from this hospitalization (including imaging, microbiology, ancillary and laboratory) are listed below for reference.    Significant Diagnostic Studies: Dg Chest 2 View  10/16/2015  CLINICAL DATA:  Hypoxia for several days. Confusion. History of breast carcinoma EXAM: CHEST  2 VIEW COMPARISON:  August 15, 2015 FINDINGS: There is patchy airspace consolidation in both lung bases. There are small pleural effusions as well. The heart is mildly enlarged with pulmonary vascularity within normal limits. Patient is status post coronary artery bypass grafting. No adenopathy. There is degenerative change in the thoracic spine. IMPRESSION: Bibasilar airspace consolidation. Suspect pneumonia, although alveolar edema could present in this manner. There may be both congestive heart failure and pneumonia present. Note that there are small pleural effusions bilaterally with cardiomegaly. Electronically Signed   By: Lowella Grip III M.D.   On: 10/16/2015 14:34   Dg Chest Port 1 View  10/17/2015  CLINICAL DATA:  PICC placement. EXAM: PORTABLE CHEST 1 VIEW COMPARISON:  PA and lateral chest 10/16/2015. FINDINGS: A new right PICC is in place with the tip projecting in the mid to lower superior vena cava. Bibasilar airspace disease persists without marked change. No pneumothorax. There is cardiomegaly. IMPRESSION: Tip of right PICC projects in good position. No change in bilateral airspace disease. Electronically Signed   By: Inge Rise M.D.   On: 10/17/2015 11:33     Microbiology: Recent Results (from the past 240 hour(s))  Blood Culture  (routine x 2)     Status: None   Collection Time: 10/16/15  1:30 PM  Result Value Ref Range Status   Specimen Description BLOOD LEFT FOREARM  Final   Special Requests BOTTLES DRAWN AEROBIC AND ANAEROBIC 4CC  Final   Culture NO GROWTH 5 DAYS  Final   Report Status 10/21/2015 FINAL  Final  Urine culture     Status: None   Collection Time: 10/16/15  2:35 PM  Result Value Ref Range Status   Specimen Description URINE, RANDOM  Final   Special Requests NONE  Final   Culture 40,000 COLONIES/ml ESCHERICHIA COLI  Final   Report Status 10/19/2015 FINAL  Final   Organism ID, Bacteria ESCHERICHIA COLI  Final      Susceptibility   Escherichia coli - MIC*    AMPICILLIN >=32 RESISTANT Resistant     CEFAZOLIN >=64 RESISTANT Resistant     CEFTRIAXONE <=1 SENSITIVE Sensitive     CIPROFLOXACIN >=4 RESISTANT Resistant     GENTAMICIN <=1 SENSITIVE Sensitive  IMIPENEM <=0.25 SENSITIVE Sensitive     NITROFURANTOIN <=16 SENSITIVE Sensitive     TRIMETH/SULFA <=20 SENSITIVE Sensitive     AMPICILLIN/SULBACTAM >=32 RESISTANT Resistant     PIP/TAZO >=128 RESISTANT Resistant     * 40,000 COLONIES/ml ESCHERICHIA COLI     Labs: Basic Metabolic Panel:  Recent Labs Lab 10/17/15 0524 10/17/15 1240 10/18/15 0530 10/19/15 0407 10/20/15 0500 10/21/15 0521  NA 141  --  140 140 139 142  K 2.8*  --  3.4* 3.3* 3.3* 2.8*  CL 90*  --  89* 87* 87* 91*  CO2 42*  --  43* 40* 40* 42*  GLUCOSE 129*  --  90 130* 160* 100*  BUN 52*  --  51* 54* 55* 54*  CREATININE 1.44*  --  1.47* 1.80* 2.04* 1.67*  CALCIUM 9.0  --  8.9 8.9 8.5* 8.4*  MG  --  1.7  --  1.8  --   --    Liver Function Tests:  Recent Labs Lab 10/16/15 1330  AST 16  ALT 8*  ALKPHOS 56  BILITOT 0.5  PROT 6.6  ALBUMIN 2.8*   No results for input(s): LIPASE, AMYLASE in the last 168 hours.  Recent Labs Lab 10/16/15 1330 10/18/15 0530 10/19/15 0407 10/20/15 0500 10/21/15 0523  AMMONIA 45* 53* 43* 52* 44*   CBC:  Recent Labs Lab  10/16/15 1330 10/17/15 0524 10/18/15 0530 10/19/15 0407 10/20/15 0500 10/21/15 0521  WBC 5.4 5.6 5.0 5.9 6.6 5.5  NEUTROABS 3.6  --   --   --   --   --   HGB 9.5* 10.0* 9.7* 9.9* 9.8* 9.6*  HCT 32.5* 35.3* 33.4* 35.9* 36.8* 34.3*  MCV 101.9* 101.1* 102.5* 104.4* 105.7* 103.3*  PLT 125* 139* 118* 107* 127* 97*   Cardiac Enzymes: No results for input(s): CKTOTAL, CKMB, CKMBINDEX, TROPONINI in the last 168 hours. BNP: Invalid input(s): POCBNP CBG:  Recent Labs Lab 10/20/15 1636 10/20/15 2053 10/21/15 0743 10/21/15 1107 10/21/15 1624  GLUCAP 93 140* 90 142* 121*    Time coordinating discharge:  Greater than 30 minutes  Signed:  Alicha Raspberry, DO [facility] Pager: [phone] 10/21/2015, 7:03 PM

## 2015-10-21 NOTE — Care Management Important Message (Signed)
Important Message  Patient Details  Name: [name] MRN: [id number] Date of Birth: Jun 27, 1946   Medicare Important Message Given:  Yes    Nathen May 10/21/2015, 4:13 PM

## 2015-10-22 ENCOUNTER — Ambulatory Visit: Payer: Medicare Other | Admitting: Cardiovascular Disease

## 2015-10-22 DIAGNOSIS — J9621 Acute and chronic respiratory failure with hypoxia: Secondary | ICD-10-CM

## 2015-10-22 DIAGNOSIS — E7220 Disorder of urea cycle metabolism, unspecified: Secondary | ICD-10-CM

## 2015-10-22 DIAGNOSIS — I10 Essential (primary) hypertension: Secondary | ICD-10-CM

## 2015-10-22 DIAGNOSIS — J690 Pneumonitis due to inhalation of food and vomit: Secondary | ICD-10-CM

## 2015-10-22 LAB — BASIC METABOLIC PANEL
Anion gap: 7 (ref 5–15)
BUN: 54 mg/dL — AB (ref 6–20)
CALCIUM: 8.8 mg/dL — AB (ref 8.9–10.3)
CHLORIDE: 93 mmol/L — AB (ref 101–111)
CO2: 42 mmol/L — AB (ref 22–32)
CREATININE: 1.51 mg/dL — AB (ref 0.61–1.24)
GFR calc non Af Amer: 45 mL/min — ABNORMAL LOW (ref >60)
GFR, EST AFRICAN AMERICAN: 52 mL/min — AB (ref >60)
GLUCOSE: 119 mg/dL — AB (ref 65–99)
Potassium: 3.7 mmol/L (ref 3.5–5.1)
Sodium: 142 mmol/L (ref 135–145)

## 2015-10-22 LAB — GLUCOSE, CAPILLARY
Glucose-Capillary: 96 mg/dL (ref 65–99)
Glucose-Capillary: 129 mg/dL — ABNORMAL HIGH (ref 65–99)
Glucose-Capillary: 143 mg/dL — ABNORMAL HIGH (ref 65–99)
Glucose-Capillary: 169 mg/dL — ABNORMAL HIGH (ref 65–99)

## 2015-10-22 LAB — AMMONIA: Ammonia: 53 umol/L — ABNORMAL HIGH (ref 9–35)

## 2015-10-22 MED ORDER — LACTULOSE 10 GM/15ML PO SOLN
30.0000 g | Freq: Two times a day (BID) | ORAL | Status: DC
  Administered 2015-10-23: 30 g via ORAL
  Filled 2015-10-22: qty 45

## 2015-10-22 NOTE — Clinical Social Work Note (Signed)
CSW has received calls from hospice facilities. There are no available beds for patient to discharge to today. Family updated. Will followup tomorrow.    Liz Beach MSW, [place], [place], [id number]

## 2015-10-22 NOTE — Clinical Social Work Note (Signed)
Spoke with Neoma Laming from [facility]. Patient's information is being reviewed at this time.   Liz Beach MSW, [place], [place], [id number]

## 2015-10-22 NOTE — Clinical Social Work Note (Signed)
Referral made to [facility] home.  Liz Beach MSW, [place], [place], [id number]

## 2015-10-22 NOTE — Clinical Social Work Note (Signed)
Call received from [place] with [facility]. Facility can admit the patient in the AM. Daughter has signed consents, consents faxed back to [place]. Report left for covering CSW.   Liz Beach MSW, [place], [place], [id number]

## 2015-10-22 NOTE — Progress Notes (Signed)
Nutrition Brief Note  Chart reviewed due to low Braden score. Plans for transfer to Hospice care at [place].  No nutrition interventions warranted at this time.  Please consult as needed.   Molli Barrows, RD, LDN, [place] Pager [phone] After Hours Pager [phone]

## 2015-10-22 NOTE — Progress Notes (Signed)
Daily Progress Note   Patient Name: [name]       Date: 10/22/2015 DOB: 12-16-1945  Age: 70 y.o. MRN#: [id number] Attending Physician: Barton Dubois, MD Primary Care Physician: Jene Every, MD Admit Date: 10/16/2015  Reason for Consultation/Follow-up: Establishing goals of care  Subjective: I checked in with [name] today. He is still very confused and hands are more cool to touch and cyanotic than previously. NT still trying to get him to eat some breakfast - he is still eating at times with encouragement when he is more alert. Prognosis remains poor and likely only a couple weeks or less. Family aware of this and have recently transferred [name] sister into hospice care at [facility] and they would like for him to be there as well with the rest of his family. I did speak with daughter, [name], who is understandably emotional and upset thinking about losing her father. Emotional support provided. She confirms desire to have him at [facility]. 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 Will await assessment from hospice.    Length of Stay: 6 days  Current Medications: Scheduled Meds:  . amoxicillin-clavulanate  1 tablet Oral BID  . antiseptic oral rinse  7 mL Mouth Rinse BID  . aspirin EC  325 mg Oral Daily  . atorvastatin  20 mg Oral QHS  . bisoprolol  2.5 mg Oral Daily  . folic acid  1 mg Oral Daily  . gabapentin  300 mg Oral BID  . hydrALAZINE  12.5 mg Oral TID  . insulin aspart  0-5 Units Subcutaneous QHS  . insulin aspart  0-9 Units Subcutaneous TID WC  . insulin glargine  6  Units Subcutaneous QHS  . ipratropium-albuterol  3 mL Nebulization BID  . lactulose  30 g Oral TID  . lactulose  300 mL Rectal Once  . levothyroxine  187 mcg Oral QAC breakfast  . pantoprazole  40 mg Oral Daily  . potassium chloride  20 mEq Oral BID  . sildenafil  20 mg Oral TID  . sodium chloride  10-40 mL  Intracatheter Q12H  . sodium chloride  3 mL Intravenous Q12H  . tamoxifen  20 mg Oral Daily  . torsemide  80 mg Oral BID  . venlafaxine XR  225 mg Oral Q breakfast    Continuous Infusions:    PRN Meds: sodium chloride, acetaminophen, albuterol, fentaNYL (SUBLIMAZE) injection, ondansetron (ZOFRAN) IV, oxyCODONE, sodium chloride, sodium chloride  Physical Exam: Physical ExamConstitutional: He appears well-developed and well-nourished. He appears lethargic. He has a sickly appearance.  HENT:  Head: Normocephalic and atraumatic.  Cardiovascular: An irregular rhythm present.  Respiratory: Effort normal. No accessory muscle usage. No tachypnea. No respiratory distress. He has decreased breath sounds.  Shallow breathes  GI: Soft. Normal appearance. There is no tenderness.  Musculoskeletal:  + asterixis  Neurological: He appears lethargic. He is disoriented.   Vital Signs: BP 100/45 mmHg  Pulse 64  Temp(Src) 97.9 F (36.6 C) (Axillary)  Resp 13  Ht 5\' 9"  (1.753 m)  Wt 112.038 kg (247 lb)  BMI 36.46 kg/m2  SpO2 100% SpO2: SpO2: 100 % O2 Device: O2 Device: Nasal Cannula O2 Flow Rate: O2 Flow Rate (L/min): 4 L/min  Intake/output summary:   Intake/Output Summary (Last 24 hours) at 10/22/15 1026 Last data filed at 10/22/15 1021  Gross per 24 hour  Intake    960 ml  Output   4152 ml  Net  -3192 ml   LBM: Last BM Date: 10/18/15 Baseline Weight: Weight: 118.933 kg (262 lb 3.2 oz) Most recent weight: Weight: 112.038 kg (247 lb)       Palliative Assessment/Data: Flowsheet Rows        Most Recent Value   Intake Tab    Referral Department  Cardiology   Unit at Time  of Referral  Cardiac/Telemetry Unit   Palliative Care Primary Diagnosis  Cardiac   Date Notified  10/19/15   Palliative Care Type  New Palliative care   Reason for referral  Clarify Goals of Care   Date of Admission  10/16/15   Date first seen by Palliative Care  10/20/15   # of days Palliative referral response time  1 Day(s)   # of days IP prior to Palliative referral  3   Clinical Assessment    Psychosocial & Spiritual Assessment    Palliative Care Outcomes       Additional Data Reviewed: CBC    Component Value Date/Time   WBC 5.5 10/21/2015 0521   WBC 6.6 10/22/2014 1407   RBC 3.32* 10/21/2015 0521   RBC 3.91* 10/22/2014 1407   RBC 3.20* 09/07/2012 0500   HGB 9.6* 10/21/2015 0521   HGB 11.7* 10/22/2014 1407   HCT 34.3* 10/21/2015 0521   HCT 37.9* 10/22/2014 1407   PLT 97* 10/21/2015 0521   PLT 105* 10/22/2014 1407   MCV 103.3* 10/21/2015 0521   MCV 96.8 10/22/2014 1407   MCH 28.9 10/21/2015 0521   MCH 30.0 10/22/2014 1407   MCHC 28.0* 10/21/2015 0521   MCHC 31.0* 10/22/2014 1407   RDW 17.4* 10/21/2015 0521   RDW 18.3* 10/22/2014 1407   LYMPHSABS 0.8 10/16/2015 1330   LYMPHSABS 1.9 10/22/2014 1407   MONOABS 0.7 10/16/2015 1330   MONOABS 0.6 10/22/2014 1407   EOSABS 0.3 10/16/2015 1330   EOSABS 0.6* 10/22/2014 1407   BASOSABS 0.0 10/16/2015 1330   BASOSABS 0.1 10/22/2014 1407    CMP     Component Value Date/Time   NA 142 10/22/2015 0500   NA 138 10/22/2014 1407   K 3.7 10/22/2015 0500  K 3.9 10/22/2014 1407   CL 93* 10/22/2015 0500   CO2 42* 10/22/2015 0500   CO2 30* 10/22/2014 1407   GLUCOSE 119* 10/22/2015 0500   GLUCOSE 130 10/22/2014 1407   BUN 54* 10/22/2015 0500   BUN 55.3* 10/22/2014 1407   CREATININE 1.51* 10/22/2015 0500   CREATININE 1.34* 07/22/2015 1411   CREATININE 1.4* 10/22/2014 1407   CALCIUM 8.8* 10/22/2015 0500   CALCIUM 8.6 10/22/2014 1407   PROT 6.6 10/16/2015 1330   PROT 6.6 10/22/2014 1407   ALBUMIN 2.8* 10/16/2015 1330    ALBUMIN 3.2* 10/22/2014 1407   AST 16 10/16/2015 1330   AST 16 10/22/2014 1407   ALT 8* 10/16/2015 1330   ALT 6 10/22/2014 1407   ALKPHOS 56 10/16/2015 1330   ALKPHOS 68 10/22/2014 1407   BILITOT 0.5 10/16/2015 1330   BILITOT 0.46 10/22/2014 1407   GFRNONAA 45* 10/22/2015 0500   GFRAA 52* 10/22/2015 0500       Problem List:  Patient Active Problem List   Diagnosis Date Noted  . Palliative care encounter 10/20/2015  . Acute on chronic respiratory failure with hypoxia ([place]) 10/17/2015  . Aspiration pneumonia ([place]) 10/17/2015  . Hyperammonemia ([place]) 10/17/2015  . Asterixis 10/16/2015  . Pleural effusion 10/16/2015  . Fluid overload 10/16/2015  . HCAP (healthcare-associated pneumonia) 10/11/2015  . Dyslipidemia with low high density lipoprotein (HDL) cholesterol with hypertriglyceridemia due to type 2 diabetes mellitus ([place]) 08/25/2015  . Persistent atrial fibrillation ([place])   . Hypoxia   . Pulmonary hypertension ([place])   . ARF (acute renal failure) ([place])   . Acute on chronic diastolic heart failure ([place])   . Faintness   . Stroke (cerebrum) ([place])   . Acute encephalopathy 08/12/2015  . Hypoxemia requiring supplemental oxygen 08/11/2015  . A-fib ([place]) 08/11/2015  . Acute respiratory failure with hypoxia ([place]) 08/11/2015  . Sepsis ([place]) 07/01/2015  . Sepsis due to cellulitis ([place]) 07/01/2015  . SOB (shortness of breath)   . Acute on chronic diastolic CHF (congestive heart failure), NYHA class 3 ([place])   . Encephalopathy, metabolic   . Hypomagnesemia 05/27/2015  . History of male breast cancer, s/p R mastectomy Jan 2016 05/27/2015  . General weakness 05/27/2015  . Generalized weakness 05/26/2015  . Hypokalemia 10/03/2014  . Congestive heart failure ([place]) 10/02/2014  . Lower extremity pain, left 10/02/2014  . Diabetes mellitus type 2, controlled ([place]) 10/02/2014  . Enterocutaneous fistula   . Hypertension 08/30/2014  . COPD (chronic obstructive pulmonary disease) ([place])  08/30/2014  . Intracerebral hemorrhage ([place]) 08/01/2014  . Osteoarthritis of both hands 07/30/2014  . Thrush, oral 07/28/2014  . Debility 07/25/2014  . Diabetic polyneuropathy associated with type 2 diabetes mellitus ([place]) 07/25/2014  . Gout flare 07/24/2014  . Pre-operative cardiovascular examination 07/15/2014  . Family history of malignant neoplasm of gastrointestinal tract 06/12/2014  . Panniculitis 04/14/2013  . Seasonal allergies 12/20/2012  . L1 vertebral fracture ([place]) 09/07/2012  . Hypothyroidism 08/30/2012  . ETOH abuse   . PAF (paroxysmal atrial fibrillation) ([place])   . Chronic diastolic heart failure ([place])   . DM (diabetes mellitus) type II uncontrolled, periph vascular disorder ([place]) 06/20/2009  . Obstructive sleep apnea 06/20/2009  . CAROTID STENOSIS 06/20/2009  . CHRONIC OBSTRUCTIVE PULMONARY DISEASE 06/20/2009  . Coronary atherosclerosis of native coronary artery 06/20/2009  . Hyperlipidemia   . Hypertensive heart disease      Palliative Care Assessment & Plan    1.Code Status:  DNR    Code Status Orders  Start     Ordered   10/18/15 1053  Do not attempt resuscitation (DNR)   Continuous    Question Answer Comment  In the event of cardiac or respiratory ARREST Do not call a "code blue"   In the event of cardiac or respiratory ARREST Do not perform Intubation, CPR, defibrillation or ACLS   In the event of cardiac or respiratory ARREST Use medication by any route, position, wound care, and other measures to relive pain and suffering. May use oxygen, suction and manual treatment of airway obstruction as needed for comfort.      10/18/15 1052    Code Status History    Date Active Date Inactive Code Status Order ID Comments User Context   10/16/2015  6:24 PM 10/18/2015 10:52 AM Full Code [id number]  Hosie Poisson, MD Inpatient   08/15/2015  3:34 PM 08/22/2015  3:58 PM Full Code [id number]  Jettie Booze, MD Inpatient   08/12/2015  1:45 AM 08/15/2015  3:34  PM Full Code [id number]  Rise Patience, MD Inpatient   07/01/2015 10:07 PM 07/07/2015  4:51 PM Full Code [id number]  Reubin Milan, MD Inpatient   06/09/2015  6:39 PM 06/09/2015  6:39 PM Full Code [id number]  Bary Leriche, PA-C Inpatient   06/09/2015  6:39 PM 06/18/2015  4:01 PM Full Code [id number]  Bary Leriche, PA-C Inpatient   06/02/2015  7:08 PM 06/09/2015  6:39 PM Full Code [id number]  Coralie Keens, MD Inpatient   05/27/2015  1:55 AM 05/29/2015  4:59 PM DNR [id number]  Roney Jaffe, MD Inpatient   11/11/2014  1:16 PM 11/12/2014  6:07 PM Full Code [id number]  Excell Seltzer, MD Inpatient   08/31/2014  1:56 AM 09/01/2014  6:09 PM Full Code [id number]  Allyne Gee, MD Inpatient   07/24/2014  5:43 PM 08/02/2014  4:42 PM Full Code [id number]  Bary Leriche, PA-C Inpatient   07/21/2014  8:33 PM 07/24/2014  5:43 PM Full Code [id number]  Cordelia Poche, MD Inpatient   04/22/2014  7:37 PM 04/29/2014  7:06 PM Full Code [id number]  Arne Cleveland, MD Inpatient   04/21/2014 12:54 AM 04/22/2014  7:37 PM Full Code [id number]  Lamar Sprinkles, MD ED   01/28/2014  5:01 AM 02/05/2014  7:43 PM Full Code [id number]  Berle Mull, MD Inpatient   12/26/2012  2:48 AM 01/01/2013  4:48 PM Full Code [id number]  Edward Jolly, MD Inpatient   10/05/2011  7:39 PM 10/16/2011  2:27 PM Full Code [id number]  Doree Fudge, MD Inpatient       2. Goals of Care/Additional Recommendations:  Goal is for comfort and that he does not suffer.   Limitations on Scope of Treatment: Full Comfort Care  Desire for further Chaplaincy support:yes  Psycho-social Needs: Caregiving  Support/Resources and Education on Hospice  3. Symptom Management:      1. Continue torsemide for comfort. Continue lactulose to help him be alert, if able, to share more moments with family as he did today.      2. Fentanyl prn pain/dyspnea.   4. Palliative Prophylaxis:   Frequent Pain Assessment  5. Prognosis: < 2 weeks  6. Discharge  Planning:  Hospice facility   Thank you for allowing the Palliative Medicine Team to assist in the care of this patient.   Time In: 1000 Time Out: 1020 Total Time 62min Prolonged Time Billed  no        Pershing Proud, NP  [id number], 10:26 AM  Please contact Palliative Medicine Team phone at [phone] for questions and concerns.

## 2015-10-22 NOTE — Progress Notes (Signed)
Advanced Heart Failure Rounding Note   Subjective:    Denies SOB or CP. Seems to be slightly more clear today but still very confused.  Arrangements in process for transfer to hospice care. Referral made this am to [place].   CVP 10.  Weight down 5 lbs back on home torsemide. Creatinine trending down.   Objective:   Weight Range:  Vital Signs:   Temp:  [97.8 F (36.6 C)-98.3 F (36.8 C)] 97.9 F (36.6 C) (01/11 0445) Pulse Rate:  [60-74] 64 (01/11 0500) Resp:  [13-19] 13 (01/11 0500) BP: (100-139)/(45-81) 100/45 mmHg (01/11 0500) SpO2:  [96 %-100 %] 100 % (01/11 0445) Weight:  [247 lb (112.038 kg)] 247 lb (112.038 kg) (01/11 0500) Last BM Date: 10/18/15  Weight change: Filed Weights   10/20/15 0600 10/21/15 0814 10/22/15 0500  Weight: 254 lb 6.6 oz (115.4 kg) 252 lb 8 oz (114.533 kg) 247 lb (112.038 kg)    Intake/Output:   Intake/Output Summary (Last 24 hours) at 10/22/15 0833 Last data filed at 10/22/15 0500  Gross per 24 hour  Intake    600 ml  Output   3552 ml  Net  -2952 ml     PHYSICAL EXAM: General: Elderly and chronically ill appearing. In bed. Awake. Alert to person. Answers yes or no questions. Still very confused. HEENT: Normal  Neck: supple. JVP difficult to assess, appears elevated. Carotids 2+ bilat; no bruits. No thyromegaly or lymphadenopathy appreciated.  Cor: PMI nondisplaced. Irregular. No M/G/R Lungs: Diminished bases Abdomen: Obese, soft, NT, mildy distended, no HSM. No bruits or masses. +BS  Extremities: no cyanosis, clubbing, rash. R and LLE with compression wraps. 1+ edema Neuro: Alert to person, cranial nerves grossly intact. moves all 4 extremities w/o difficulty. Affect pleasant  Telemetry: Reviewed personally,  A fib 80-90s   Labs: Basic Metabolic Panel:  Recent Labs Lab 10/17/15 1240 10/18/15 0530 10/19/15 0407 10/20/15 0500 10/21/15 0521 10/22/15 0500  NA  --  140 140 139 142 142  K  --  3.4* 3.3* 3.3*  2.8* 3.7  CL  --  89* 87* 87* 91* 93*  CO2  --  43* 40* 40* 42* 42*  GLUCOSE  --  90 130* 160* 100* 119*  BUN  --  51* 54* 55* 54* 54*  CREATININE  --  1.47* 1.80* 2.04* 1.67* 1.51*  CALCIUM  --  8.9 8.9 8.5* 8.4* 8.8*  MG 1.7  --  1.8  --   --   --     Liver Function Tests:  Recent Labs Lab 10/16/15 1330  AST 16  ALT 8*  ALKPHOS 56  BILITOT 0.5  PROT 6.6  ALBUMIN 2.8*   No results for input(s): LIPASE, AMYLASE in the last 168 hours.  Recent Labs Lab 10/20/15 0500 10/21/15 0523 10/22/15 0500  AMMONIA 52* 44* 53*    CBC:  Recent Labs Lab 10/16/15 1330 10/17/15 0524 10/18/15 0530 10/19/15 0407 10/20/15 0500 10/21/15 0521  WBC 5.4 5.6 5.0 5.9 6.6 5.5  NEUTROABS 3.6  --   --   --   --   --   HGB 9.5* 10.0* 9.7* 9.9* 9.8* 9.6*  HCT 32.5* 35.3* 33.4* 35.9* 36.8* 34.3*  MCV 101.9* 101.1* 102.5* 104.4* 105.7* 103.3*  PLT 125* 139* 118* 107* 127* 97*    Cardiac Enzymes: No results for input(s): CKTOTAL, CKMB, CKMBINDEX, TROPONINI in the last 168 hours.  BNP: BNP (last 3 results)  Recent Labs  09/03/15 1604 09/25/15  1245 10/16/15 1330  BNP 395.2* 365.1* 851.3*    ProBNP (last 3 results) No results for input(s): PROBNP in the last 8760 hours.    Other results:  Imaging: No results found.   Medications:     Scheduled Medications: . amoxicillin-clavulanate  1 tablet Oral BID  . antiseptic oral rinse  7 mL Mouth Rinse BID  . aspirin EC  325 mg Oral Daily  . atorvastatin  20 mg Oral QHS  . bisoprolol  2.5 mg Oral Daily  . folic acid  1 mg Oral Daily  . gabapentin  300 mg Oral BID  . hydrALAZINE  12.5 mg Oral TID  . insulin aspart  0-5 Units Subcutaneous QHS  . insulin aspart  0-9 Units Subcutaneous TID WC  . insulin glargine  6 Units Subcutaneous QHS  . ipratropium-albuterol  3 mL Nebulization BID  . lactulose  30 g Oral TID  . lactulose  300 mL Rectal Once  . levothyroxine  187 mcg Oral QAC breakfast  . pantoprazole  40 mg Oral Daily  .  potassium chloride  20 mEq Oral BID  . sildenafil  20 mg Oral TID  . sodium chloride  10-40 mL Intracatheter Q12H  . sodium chloride  3 mL Intravenous Q12H  . tamoxifen  20 mg Oral Daily  . torsemide  80 mg Oral BID  . venlafaxine XR  225 mg Oral Q breakfast    Infusions:    PRN Medications: sodium chloride, acetaminophen, albuterol, fentaNYL (SUBLIMAZE) injection, ondansetron (ZOFRAN) IV, oxyCODONE, sodium chloride, sodium chloride   Assessment:   1. A/C Diastolic Heart Failure 2. Acute Respiratory Failure with Hypercarbia 3. H/O Cirrohsis with hepatic encephalopathy--> Elevated ammonia 4. R Pleural Effusion 5. PAH 6. A fib -Chronic 7. Acute on CKD 8. H/O Breast Cancer S/P mastectomy 9. Hypokalemia 10. Acute delirium/lethargy 11. DNR  Plan/Discussion:    Remains volume overloaded. Though volume status improving slightly on home torsemide and diamox. Renal function trending down. CVP 10 this am.  Though he has shown some improvement now on his torsemide, as seen in the past several months he maintains euvolemia for relatively short periods of time.  Continue to agree with palliative approach.   Unasyn stopped (for possible aspiration). Now only on Augmentin. Per primary team.   Chronic AF, rate controlled. Poor candidate for [facility].   He is a DNR and awaiting discharge to hospice care. Referral made to [facility] by CSW this am.    Shirley Friar, PA-C 8:33 AM Advanced Heart Failure Team Pager [phone] (M-F; 7a - 4p)  Please contact [facility] for night-coverage after hours (4p -7a ) and weekends on [email or web address]  Length of Stay: 6   Patient seen and examined with Oda Kilts, PA-C. We discussed all aspects of the encounter. I agree with the assessment and plan as stated above.   He is terminally ill. Agree with transfer to [facility]. Continue po diuretics. We wll sign off. Please call if we can help. Appreciate [facility] care.  Bensimhon,  Daniel,MD 4:39 PM

## 2015-10-22 NOTE — Progress Notes (Signed)
[facility] PROGRESS NOTE  [name] [id number] DOB: 1945/12/20 DOA: 10/16/2015 PCP: Jene Every, MD  Assessment/Plan: Acute on chronic diastolic CHF/cor pulmonale-right heart failure -The patient was previously admitted July 2015 with acute on chronic diastolic CHF and developed cardiorenal syndrome requiring temporary dialysis. -August 2015 Myoview negative for ischemia -transition to PO demadex per cardiology -I/Os--still positive balance, but no SOB or orthopnea -08/16/2015 echo EF 55-60%, PAP 82 -continue sildenafil   -goal is for comfort care  Acute on chronic respiratory failure with hypoxia and hypercarbia -Multifactorial including pulmonary hypertension, CHF, COPD, aspiration pneumonia, and likely underlying OHS/OSA -continue sildenafil  -wean oxygen to keep saturation 90-94% TO AVOID HYPERCARBIA -stable on 1 to 2L [place]  Pulmonary infiltrates/consolidation/aspiration pneumonia -Concern about aspiration--pt likely chronically aspirating  -Cheked procalcitonin--<0.10 -speech therapy has been consulted-->regular diet with thin liquids -d/c all antibiotics -plan is for full comfort   Acute encephalopathy/hepatic encephalopathy -Multifactorial including hyperammonemia, hypercarbia, hypoxia, pneumonia -Last used alcohol greater than 4 months ago -Suspect underlying cognitive impairment -B12 and TSH WNL -ammonia 53-->45-->52 -continue to be confused  -will continue with lactulose as mentioned below  Hypothyroidism -continue synthroid  Hyperammonemia -Likely due to chronic hepatic congestion secondary to his pulmonary hypertension and cirrhosis -08/12/2015 ammonia 18 -10/16/2015 ammonia 45 -Continue lactulose to 30 grams every 12 hours-- goals if to help minimizing encephalopathy and try to improve interaction with family. -while possible will attempt for 2 BM per day  Chronic Atrial fibrillation -Not felt to be a candidate for anticoagulation secondary  to poor compliance and continued alcohol use -Rate controlled -Continue bisoprolol -CHADSVASc = 6 -ASA discontinued given transition to full comfort care  Thrombocytopenia -Likely due to underlying cirrhosis/chronic hepatic congestion -Chronic and stable -no overt bleeding appreciated  Diabetes mellitus type 2  -Previous problems with hypoglycemia on oral agents  -NovoLog sliding scale while inpatient -at discharge to hospice will discontinue SSI and CBG's  -10/18/15--last A1c--7.3  CAD status post CABG:  -no CP -will continue b-blocker and hydralazine as mentioned below -no further statins or ASA -main goal is comfort care  Hypertension -Continue bisoprolol, hydralazine and demadex   History of right breast cancer: Status post mastectomy, was on tamoxifen and followed with Dr Lindi Adie. -given plan for full comfort and symptomatic management will discontinue tamoxifen (in an effort to decrease amount of pills per day)  History of enterocutaneous fistula:  -Has post exploratory laparotomy and bowel resection in August 2016. Chronic small ulceration in the anterior abdominal wall that appears stable and w/o signs of infection  History of stroke  -October 2015  -plan now for full comfort care -will stop medications that will not help him achieving comfort state (ASA, statins, etc...)  Goals of Care -appreciate palliative consult--family working through comfort care but continue all medications for now pending continued conversations with palliative medicine service  Code Status: DO NOT RESUSCITATE/DO NOT INTUBATE Family Communication: No family at bedside Disposition Plan: Plan is for the patient to be transferred to hospice home for further comfort measures and end-of-life symptomatic treatment.   Consultants:  Heart failure   Palliative care  Procedures:  See below for x-ray reports  Antibiotics:  Augmentin 1/10>>1/11  Vancomycin/zosyn 1/5>>>1/8  Unasyn  1/8>>1/10  HPI/Subjective: Afebrile, chronically ill in appearance. Overall appears to be comfortable. Oriented to person, able to follow simple commands but very confused.  Objective: Filed Vitals:   10/22/15 1200 10/22/15 1628  BP: 130/57 126/64  Pulse: 54 72  Temp:  97.8 F (36.6 C)  Resp:  18    Intake/Output Summary (Last 24 hours) at 10/22/15 1921 Last data filed at 10/22/15 1602  Gross per 24 hour  Intake    840 ml  Output   3500 ml  Net  -2660 ml   Filed Weights   10/20/15 0600 10/21/15 0814 10/22/15 0500  Weight: 115.4 kg (254 lb 6.6 oz) 114.533 kg (252 lb 8 oz) 112.038 kg (247 lb)    Exam:   General:  Overall comfortable, lethargic and disoriented. No fever  Cardiovascular: S1 and S2, no rubs or gallops; no JVD  Respiratory: Scattered rhonchi, no wheezing, no frank crackles; no use of accessory muscles.  Abdomen: Soft, nontender, positive bowel sounds  Musculoskeletal: 1-2+ edema lower extremity bilaterally with Unna boots in place  Data Reviewed: Basic Metabolic Panel:  Recent Labs Lab 10/17/15 1240 10/18/15 0530 10/19/15 0407 10/20/15 0500 10/21/15 0521 10/22/15 0500  NA  --  140 140 139 142 142  K  --  3.4* 3.3* 3.3* 2.8* 3.7  CL  --  89* 87* 87* 91* 93*  CO2  --  43* 40* 40* 42* 42*  GLUCOSE  --  90 130* 160* 100* 119*  BUN  --  51* 54* 55* 54* 54*  CREATININE  --  1.47* 1.80* 2.04* 1.67* 1.51*  CALCIUM  --  8.9 8.9 8.5* 8.4* 8.8*  MG 1.7  --  1.8  --   --   --    Liver Function Tests:  Recent Labs Lab 10/16/15 1330  AST 16  ALT 8*  ALKPHOS 56  BILITOT 0.5  PROT 6.6  ALBUMIN 2.8*    Recent Labs Lab 10/18/15 0530 10/19/15 0407 10/20/15 0500 10/21/15 0523 10/22/15 0500  AMMONIA 53* 43* 52* 44* 53*   CBC:  Recent Labs Lab 10/16/15 1330 10/17/15 0524 10/18/15 0530 10/19/15 0407 10/20/15 0500 10/21/15 0521  WBC 5.4 5.6 5.0 5.9 6.6 5.5  NEUTROABS 3.6  --   --   --   --   --   HGB 9.5* 10.0* 9.7* 9.9* 9.8* 9.6*   HCT 32.5* 35.3* 33.4* 35.9* 36.8* 34.3*  MCV 101.9* 101.1* 102.5* 104.4* 105.7* 103.3*  PLT 125* 139* 118* 107* 127* 97*   BNP (last 3 results)  Recent Labs  09/03/15 1604 09/25/15 1245 10/16/15 1330  BNP 395.2* 365.1* 851.3*   CBG:  Recent Labs Lab 10/21/15 1624 10/21/15 2056 10/22/15 0812 10/22/15 1119 10/22/15 1626  GLUCAP 121* 135* 96 129* 143*    Recent Results (from the past 240 hour(s))  Blood Culture (routine x 2)     Status: None   Collection Time: 10/16/15  1:30 PM  Result Value Ref Range Status   Specimen Description BLOOD LEFT FOREARM  Final   Special Requests BOTTLES DRAWN AEROBIC AND ANAEROBIC 4CC  Final   Culture NO GROWTH 5 DAYS  Final   Report Status 10/21/2015 FINAL  Final  Urine culture     Status: None   Collection Time: 10/16/15  2:35 PM  Result Value Ref Range Status   Specimen Description URINE, RANDOM  Final   Special Requests NONE  Final   Culture 40,000 COLONIES/ml ESCHERICHIA COLI  Final   Report Status 10/19/2015 FINAL  Final   Organism ID, Bacteria ESCHERICHIA COLI  Final      Susceptibility   Escherichia coli - MIC*    AMPICILLIN >=32 RESISTANT Resistant     CEFAZOLIN >=64 RESISTANT Resistant     CEFTRIAXONE <=1 SENSITIVE Sensitive  CIPROFLOXACIN >=4 RESISTANT Resistant     GENTAMICIN <=1 SENSITIVE Sensitive     IMIPENEM <=0.25 SENSITIVE Sensitive     NITROFURANTOIN <=16 SENSITIVE Sensitive     TRIMETH/SULFA <=20 SENSITIVE Sensitive     AMPICILLIN/SULBACTAM >=32 RESISTANT Resistant     PIP/TAZO >=128 RESISTANT Resistant     * 40,000 COLONIES/ml ESCHERICHIA COLI     Studies: No results found.  Scheduled Meds: . antiseptic oral rinse  7 mL Mouth Rinse BID  . bisoprolol  2.5 mg Oral Daily  . gabapentin  300 mg Oral BID  . hydrALAZINE  12.5 mg Oral TID  . insulin aspart  0-9 Units Subcutaneous TID WC  . insulin glargine  6 Units Subcutaneous QHS  . ipratropium-albuterol  3 mL Nebulization BID  . [START ON 10/23/2015]  lactulose  30 g Oral BID  . levothyroxine  187 mcg Oral QAC breakfast  . pantoprazole  40 mg Oral Daily  . potassium chloride  20 mEq Oral BID  . sildenafil  20 mg Oral TID  . sodium chloride  10-40 mL Intracatheter Q12H  . sodium chloride  3 mL Intravenous Q12H  . torsemide  80 mg Oral BID  . venlafaxine XR  225 mg Oral Q breakfast   Continuous Infusions:   Active Problems:   PAF (paroxysmal atrial fibrillation) (HCC)   Hypertension   Acute on chronic diastolic CHF (congestive heart failure), NYHA class 3 (HCC)   A-fib (HCC)   Acute respiratory failure with hypoxia (HCC)   Acute encephalopathy   HCAP (healthcare-associated pneumonia)   Asterixis   Pleural effusion   Fluid overload   Acute on chronic respiratory failure with hypoxia (HCC)   Aspiration pneumonia (HCC)   Hyperammonemia ([place])   Palliative care encounter    Time spent: 25 minutes    Barton Dubois  [facility] Pager [phone]. If 7PM-7AM, please contact night-coverage at [email or web address], password [email or web address] 10/22/2015, 7:21 PM  LOS: 6 days

## 2015-10-23 LAB — GLUCOSE, CAPILLARY
GLUCOSE-CAPILLARY: 120 mg/dL — AB (ref 65–99)
Glucose-Capillary: 85 mg/dL (ref 65–99)

## 2015-10-23 MED ORDER — LACTULOSE 10 GM/15ML PO SOLN: 30.0000 g | Freq: Two times a day (BID) | ORAL | Status: AC

## 2015-10-23 MED ORDER — HYDRALAZINE HCL 25 MG PO TABS: 12.5000 mg | ORAL_TABLET | Freq: Three times a day (TID) | ORAL | Status: AC

## 2015-10-23 MED ORDER — GABAPENTIN 300 MG PO CAPS: 300.0000 mg | ORAL_CAPSULE | Freq: Two times a day (BID) | ORAL | Status: AC

## 2015-10-23 MED ORDER — POTASSIUM CHLORIDE CRYS ER 20 MEQ PO TBCR: 20.0000 meq | EXTENDED_RELEASE_TABLET | Freq: Two times a day (BID) | ORAL | Status: AC

## 2015-10-23 MED ORDER — BISOPROLOL FUMARATE 5 MG PO TABS: 2.5000 mg | ORAL_TABLET | Freq: Every day | ORAL | Status: AC

## 2015-10-23 MED ORDER — OXYCODONE HCL 10 MG PO TABS: 10.0000 mg | ORAL_TABLET | Freq: Three times a day (TID) | ORAL | Status: AC | PRN

## 2015-10-23 NOTE — Progress Notes (Signed)
Report called to [facility].  Edward Qualia RN

## 2015-10-23 NOTE — Care Management Note (Signed)
Case Management Note  Patient Details  Name: [name] MRN: [id number] Date of Birth: 10-23-1945  Subjective/Objective:   Pt with pleural Effusions. Plan to d/c to [facility] 10-23-15.                  Action/Plan:CSW assisted with transition. No needs from CM at this time.   Expected Discharge Date:                  Expected Discharge Plan:  [place]  In-House Referral:  Clinical Social Work  Discharge planning Services  CM Consult  Post Acute Care Choice:  NA Choice offered to:  NA  DME Arranged:  N/A DME Agency:  NA  HH Arranged:  NA HH Agency:  NA  Status of Service:  Completed, signed off  Medicare Important Message Given:  Yes Date Medicare IM Given:    Medicare IM give by:    Date Additional Medicare IM Given:    Additional Medicare Important Message give by:     If discussed at [place] of Stay Meetings, dates discussed:    Additional Comments:  Bethena Roys, RN 10/23/2015, 3:23 PM

## 2015-10-23 NOTE — Clinical Social Work Note (Signed)
Clinical Social Worker facilitated patient discharge including contacting patient family and facility to confirm patient discharge plans.  Clinical information faxed to facility and family agreeable with plan.  CSW arranged ambulance transport via [facility] to [facility].  RN to call report prior to discharge.  Clinical Social Worker will sign off for now as social work intervention is no longer needed. Please consult [place] again if new need arises.  Barbette Or, [place]

## 2015-10-23 NOTE — Discharge Summary (Signed)
Physician Discharge Summary  [name] [id number] DOB: January 25, 1946 DOA: 10/16/2015  PCP: Jene Every, MD  Admit date: 10/16/2015 Discharge date: 10/23/2015  Time spent: 35 minutes  Recommendations for Outpatient Follow-up:  1. Full comfort Care  Discharge Diagnoses:  Active Problems:   PAF (paroxysmal atrial fibrillation) (HCC)   Hypertension   Acute on chronic diastolic CHF (congestive heart failure), NYHA class 3 (HCC)   A-fib (HCC)   Acute respiratory failure with hypoxia (HCC)   Acute encephalopathy   HCAP (healthcare-associated pneumonia)   Asterixis   Pleural effusion   Fluid overload   Acute on chronic respiratory failure with hypoxia (HCC)   Aspiration pneumonia (HCC)   Hyperammonemia ([place])   Palliative care encounter   Discharge Condition: stable for discharge to hospice for further comfort measures and symptomatic management.   Diet recommendation: comfort feeding   Filed Weights   10/21/15 0814 10/22/15 0500 10/23/15 0100  Weight: 114.533 kg (252 lb 8 oz) 112.038 kg (247 lb) 111.494 kg (245 lb 12.8 oz)    History of present illness:  70 year old male with a history of CAD status post CABG 2001, atrial fibrillation not a candidate for AC due to Etoh use and noncompliance, COPD, diastolic CHF, carotid stenosis status post left CEA, hypertension, hyperlipidemia and enterocutaneous fistula as a combination of small bowel resection presented with hypoxemia and confusion. The patient was seen at the heart failure clinic on 10/16/2015 for worsening lower extremity edema. He was noted to be lethargic with oxygen saturation of 79%. He was sent to the emergency department for further evaluation. After admission, the patient was started on intravenous furosemide and ultimately on a furosemide drip. Cardiology was consulted and continued to follow the patient. Unfortunately, the patient did not show clinical improvement. The patient remained encephalopathic although he was  a little bit more alert with lactulose. Palliative medicine was consulted. After discussion with the patient's family, they agreed for placement residential hospice. However, diuretics and lactulose will be continued after discharge as per discussion with the patient's family and palliative medicine.  Hospital Course:  Acute on chronic diastolic CHF/cor pulmonale-right heart failure -The patient was previously admitted July 2015 with acute on chronic diastolic CHF and developed cardiorenal syndrome requiring temporary dialysis. -August 2015 Myoview negative for ischemia -transition to PO demadex per cardiology -I/Os--still positive balance, but no SOB or orthopnea -08/16/2015 echo EF 55-60%, PAP 82 -continue sildenafil  -goal is for comfort care and symptomatic management   Acute on chronic respiratory failure with hypoxia and hypercarbia -Multifactorial including pulmonary hypertension, CHF, COPD, aspiration pneumonia, and likely underlying OHS/OSA -continue sildenafil  -wean oxygen to keep saturation 90-94% TO AVOID HYPERCARBIA -stable on 1 to 2L [place]  Pulmonary infiltrates/consolidation/aspiration pneumonia -Concern about aspiration--pt likely chronically aspirating  -Cheked procalcitonin--<0.10 -speech therapy has been consulted-->regular diet with thin liquids -d/c all antibiotics -plan is for full comfort   Acute encephalopathy/hepatic encephalopathy -Multifactorial including hyperammonemia, hypercarbia, hypoxia, pneumonia -Last used alcohol greater than 4 months ago -Suspect underlying cognitive impairment -B12 and TSH WNL -ammonia 53-->45-->52 -continue to be confused  -will continue with lactulose as mentioned below  Hypothyroidism -ok to continue synthroid  Hyperammonemia -Likely due to chronic hepatic congestion secondary to his pulmonary hypertension and cirrhosis -08/12/2015 ammonia 18 -10/16/2015 ammonia 45 -Continue lactulose to 30 grams every 12 hours--  goals if to help minimizing encephalopathy and try to improve interaction with family. -while possible will attempt for 2 BM per day  Chronic Atrial fibrillation -Not felt to be  a candidate for anticoagulation secondary to poor compliance and continued alcohol use -Rate controlled -will continue bisoprolol -CHADSVASc = 6 -ASA discontinued given transition to full comfort care  Thrombocytopenia -Likely due to underlying cirrhosis/chronic hepatic congestion -Chronic and stable -no overt bleeding appreciated  Diabetes mellitus type 2  -Previous problems with hypoglycemia on oral agents  -NovoLog sliding scale used while inpatient -at discharge to hospice will discontinue SSI, CBG's checks and any treatment for Diabetes -10/18/15--last A1c--7.3 -goal is for full comfort care  CAD status post CABG:  -no CP -will continue b-blocker and hydralazine as mentioned below -no further statins or ASA -main goal is comfort care  Hypertension -Continue bisoprolol, hydralazine and demadex   History of right breast cancer: Status post mastectomy, was on tamoxifen and followed with Dr Lindi Adie. -given plan for full comfort and symptomatic management will discontinue tamoxifen (in an effort to decrease amount of pills per day)  History of enterocutaneous fistula:  -Has post exploratory laparotomy and bowel resection in August 2016. Chronic small ulceration in the anterior abdominal wall that appears stable and w/o signs of infection  History of stroke  -October 2015  -plan now for full comfort care -will stop medications that will not help him achieving comfort state (ASA, statins, etc...)  Procedures:  See below for x-ray reports   Consultations:  Heart failure   Palliative care  Discharge Exam: Filed Vitals:   10/23/15 0100 10/23/15 0400  BP: 119/59 119/56  Pulse: 69 77  Temp:  97.6 F (36.4 C)  Resp: 14 18     General: Overall comfortable, lethargic and disoriented.  No fever  Cardiovascular: S1 and S2, no rubs or gallops; no JVD  Respiratory: Scattered rhonchi, no wheezing, no frank crackles; no use of accessory muscles.  Abdomen: Soft, nontender, positive bowel sounds  Musculoskeletal: 1-2+ edema lower extremity bilaterally with Unna boots in place   Discharge Instructions   Discharge Instructions    Discharge instructions    Complete by:  As directed   Full comfort care and symptomatic management  Continue weaning off and up titration on medications as needed Comfort feeding (just watch amount of sodium to prevent fluid overload and SOB)          Current Discharge Medication List    START taking these medications   Details  bisoprolol (ZEBETA) 5 MG tablet Take 0.5 tablets (2.5 mg total) by mouth daily.    lactulose (CHRONULAC) 10 GM/15ML solution Take 45 mLs (30 g total) by mouth 2 (two) times daily. Qty: 240 mL, Refills: 0      CONTINUE these medications which have CHANGED   Details  gabapentin (NEURONTIN) 300 MG capsule Take 1 capsule (300 mg total) by mouth 2 (two) times daily.    hydrALAZINE (APRESOLINE) 25 MG tablet Take 0.5 tablets (12.5 mg total) by mouth 3 (three) times daily.    oxyCODONE 10 MG TABS Take 1 tablet (10 mg total) by mouth 3 (three) times daily as needed (pain). Qty: 30 tablet, Refills: 0    potassium chloride SA (K-DUR,KLOR-CON) 20 MEQ tablet Take 1 tablet (20 mEq total) by mouth 2 (two) times daily.      CONTINUE these medications which have NOT CHANGED   Details  albuterol (PROVENTIL) (2.5 MG/3ML) 0.083% nebulizer solution Take 3 mLs (2.5 mg total) by nebulization every 2 (two) hours as needed for wheezing or shortness of breath.    levothyroxine (SYNTHROID, LEVOTHROID) 125 MCG tablet Take 1.5 tablets (187 mcg total) by mouth  daily before breakfast. Qty: 30 tablet, Refills: 3    sildenafil (REVATIO) 20 MG tablet Take 1 tablet (20 mg total) by mouth 3 (three) times daily. Refills: 0    torsemide  (DEMADEX) 20 MG tablet Take 80 mg by mouth 2 (two) times daily.    venlafaxine XR (EFFEXOR-XR) 75 MG 24 hr capsule Take 225 mg by mouth daily with breakfast.       STOP taking these medications     allopurinol (ZYLOPRIM) 300 MG tablet      aspirin EC 325 MG EC tablet      atorvastatin (LIPITOR) 20 MG tablet      carvedilol (COREG) 3.125 MG tablet      cetirizine (ZYRTEC) 10 MG tablet      colchicine 0.6 MG tablet      fluticasone (FLOVENT HFA) 110 MCG/ACT inhaler      folic acid (FOLVITE) 1 MG tablet      guaiFENesin (MUCINEX) 600 MG 12 hr tablet      insulin aspart (NOVOLOG) 100 UNIT/ML injection      Insulin Glargine (LANTUS SOLOSTAR) 100 UNIT/ML Solostar Pen      ipratropium-albuterol (DUONEB) 0.5-2.5 (3) MG/3ML SOLN      isosorbide dinitrate (ISORDIL) 10 MG tablet      magnesium oxide (MAG-OX) 400 (241.3 MG) MG tablet      metolazone (ZAROXOLYN) 5 MG tablet      omeprazole (PRILOSEC) 20 MG capsule      saccharomyces boulardii (FLORASTOR) 250 MG capsule      tamoxifen (NOLVADEX) 20 MG tablet        Allergies  Allergen Reactions  . Erythromycin Hives    Oramycin    The results of significant diagnostics from this hospitalization (including imaging, microbiology, ancillary and laboratory) are listed below for reference.    Significant Diagnostic Studies: Dg Chest 2 View  10/16/2015  CLINICAL DATA:  Hypoxia for several days. Confusion. History of breast carcinoma EXAM: CHEST  2 VIEW COMPARISON:  August 15, 2015 FINDINGS: There is patchy airspace consolidation in both lung bases. There are small pleural effusions as well. The heart is mildly enlarged with pulmonary vascularity within normal limits. Patient is status post coronary artery bypass grafting. No adenopathy. There is degenerative change in the thoracic spine. IMPRESSION: Bibasilar airspace consolidation. Suspect pneumonia, although alveolar edema could present in this manner. There may be both  congestive heart failure and pneumonia present. Note that there are small pleural effusions bilaterally with cardiomegaly. Electronically Signed   By: Lowella Grip III M.D.   On: 10/16/2015 14:34   Dg Chest Port 1 View  10/17/2015  CLINICAL DATA:  PICC placement. EXAM: PORTABLE CHEST 1 VIEW COMPARISON:  PA and lateral chest 10/16/2015. FINDINGS: A new right PICC is in place with the tip projecting in the mid to lower superior vena cava. Bibasilar airspace disease persists without marked change. No pneumothorax. There is cardiomegaly. IMPRESSION: Tip of right PICC projects in good position. No change in bilateral airspace disease. Electronically Signed   By: Inge Rise M.D.   On: 10/17/2015 11:33    Microbiology: Recent Results (from the past 240 hour(s))  Blood Culture (routine x 2)     Status: None   Collection Time: 10/16/15  1:30 PM  Result Value Ref Range Status   Specimen Description BLOOD LEFT FOREARM  Final   Special Requests BOTTLES DRAWN AEROBIC AND ANAEROBIC 4CC  Final   Culture NO GROWTH 5 DAYS  Final   Report  Status 10/21/2015 FINAL  Final  Urine culture     Status: None   Collection Time: 10/16/15  2:35 PM  Result Value Ref Range Status   Specimen Description URINE, RANDOM  Final   Special Requests NONE  Final   Culture 40,000 COLONIES/ml ESCHERICHIA COLI  Final   Report Status 10/19/2015 FINAL  Final   Organism ID, Bacteria ESCHERICHIA COLI  Final      Susceptibility   Escherichia coli - MIC*    AMPICILLIN >=32 RESISTANT Resistant     CEFAZOLIN >=64 RESISTANT Resistant     CEFTRIAXONE <=1 SENSITIVE Sensitive     CIPROFLOXACIN >=4 RESISTANT Resistant     GENTAMICIN <=1 SENSITIVE Sensitive     IMIPENEM <=0.25 SENSITIVE Sensitive     NITROFURANTOIN <=16 SENSITIVE Sensitive     TRIMETH/SULFA <=20 SENSITIVE Sensitive     AMPICILLIN/SULBACTAM >=32 RESISTANT Resistant     PIP/TAZO >=128 RESISTANT Resistant     * 40,000 COLONIES/ml ESCHERICHIA COLI      Labs: Basic Metabolic Panel:  Recent Labs Lab 10/17/15 1240 10/18/15 0530 10/19/15 0407 10/20/15 0500 10/21/15 0521 10/22/15 0500  NA  --  140 140 139 142 142  K  --  3.4* 3.3* 3.3* 2.8* 3.7  CL  --  89* 87* 87* 91* 93*  CO2  --  43* 40* 40* 42* 42*  GLUCOSE  --  90 130* 160* 100* 119*  BUN  --  51* 54* 55* 54* 54*  CREATININE  --  1.47* 1.80* 2.04* 1.67* 1.51*  CALCIUM  --  8.9 8.9 8.5* 8.4* 8.8*  MG 1.7  --  1.8  --   --   --    Liver Function Tests:  Recent Labs Lab 10/16/15 1330  AST 16  ALT 8*  ALKPHOS 56  BILITOT 0.5  PROT 6.6  ALBUMIN 2.8*    Recent Labs Lab 10/18/15 0530 10/19/15 0407 10/20/15 0500 10/21/15 0523 10/22/15 0500  AMMONIA 53* 43* 52* 44* 53*   CBC:  Recent Labs Lab 10/16/15 1330 10/17/15 0524 10/18/15 0530 10/19/15 0407 10/20/15 0500 10/21/15 0521  WBC 5.4 5.6 5.0 5.9 6.6 5.5  NEUTROABS 3.6  --   --   --   --   --   HGB 9.5* 10.0* 9.7* 9.9* 9.8* 9.6*  HCT 32.5* 35.3* 33.4* 35.9* 36.8* 34.3*  MCV 101.9* 101.1* 102.5* 104.4* 105.7* 103.3*  PLT 125* 139* 118* 107* 127* 97*   BNP (last 3 results)  Recent Labs  09/03/15 1604 09/25/15 1245 10/16/15 1330  BNP 395.2* 365.1* 851.3*   CBG:  Recent Labs Lab 10/22/15 0812 10/22/15 1119 10/22/15 1626 10/22/15 2125 10/23/15 0718  GLUCAP 96 129* 143* 169* 85    Signed:  Barton Dubois MD.  [facility] 10/23/2015, 8:14 AM

## 2015-10-23 NOTE — Progress Notes (Signed)
Pt discharged to skilled nursing facility via stretcher per [facility].  Edward Qualia RN

## 2015-11-03 ENCOUNTER — Encounter (HOSPITAL_COMMUNITY): Payer: Medicare Other

## 2015-11-12 DEATH — deceased

## 2016-01-27 ENCOUNTER — Ambulatory Visit: Payer: Medicare Other | Admitting: Hematology and Oncology

## 2016-08-22 IMAGING — CT CT CERVICAL SPINE W/O CM
4 of 6 series · 13 of 33 positions shown, 15 images · non-contrast
Comparison: Head CT of 05/26/2015. No prior cervical spine imaging.

CLINICAL DATA: Initial encounter for 69 [employer] walking using his
walker and fell backwards and hit his head today 08/11/15.

EXAM:
CT HEAD WITHOUT CONTRAST
CT CERVICAL SPINE WITHOUT CONTRAST
TECHNIQUE: Multidetector CT imaging of the head and cervical spine was
performed following the standard protocol without intravenous
contrast. Multiplanar CT image reconstructions of the cervical spine
were also generated.

[Series 5: c_spine 2.0 st · axial · 0.37mm/px · z∈[-332,-266]mm · 2 of 101 slices shown, 3 images]
[im 34/101  soft-tissue]
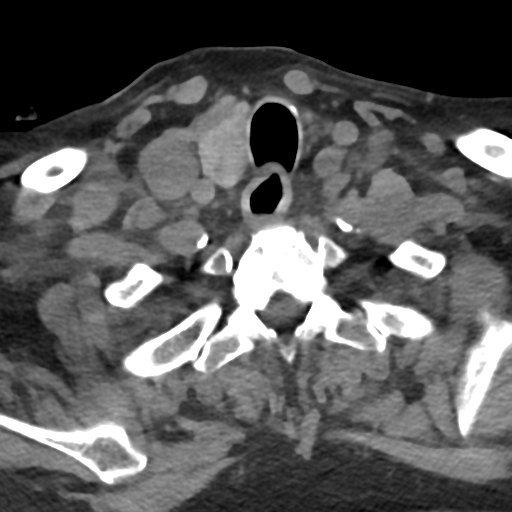
[im 34/101  bone]
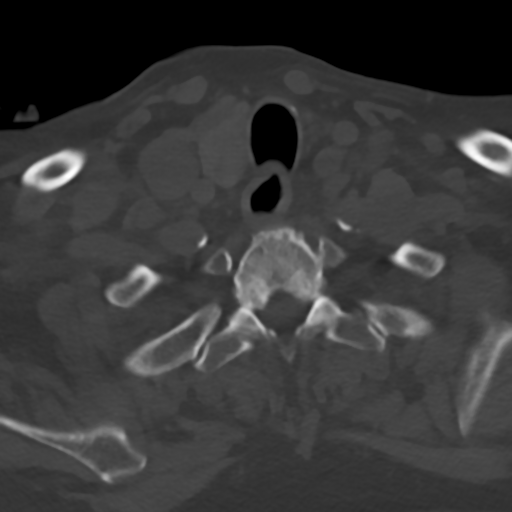
[im 67/101  bone]
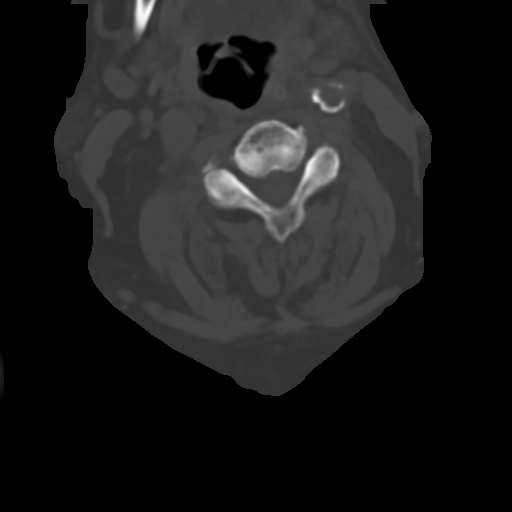

[Series 7: c_spine 2.0 sag bone · sagittal · 0.29mm/px · 5 of 54 slices shown, 6 images]
[im 18/54  bone]
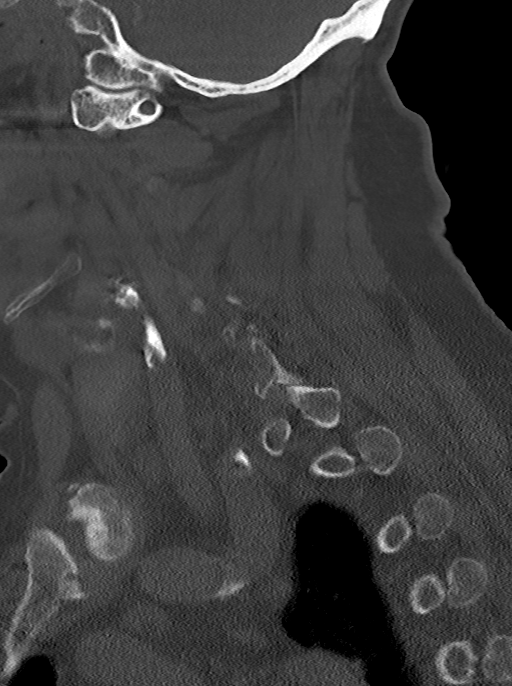
[im 23/54  bone]
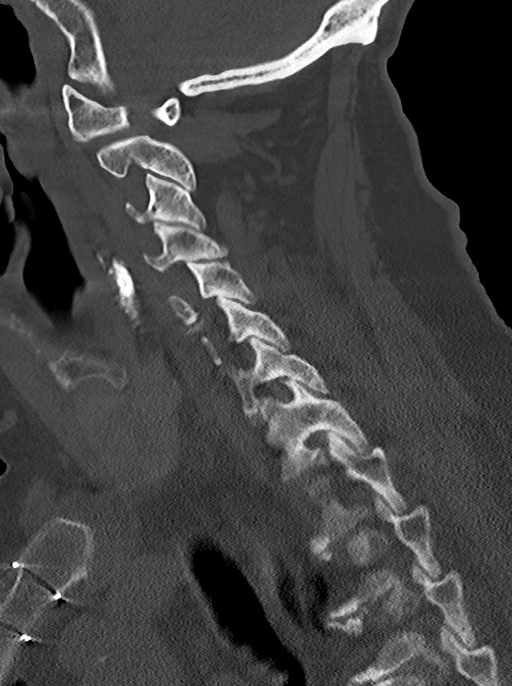
[im 27/54  soft-tissue]
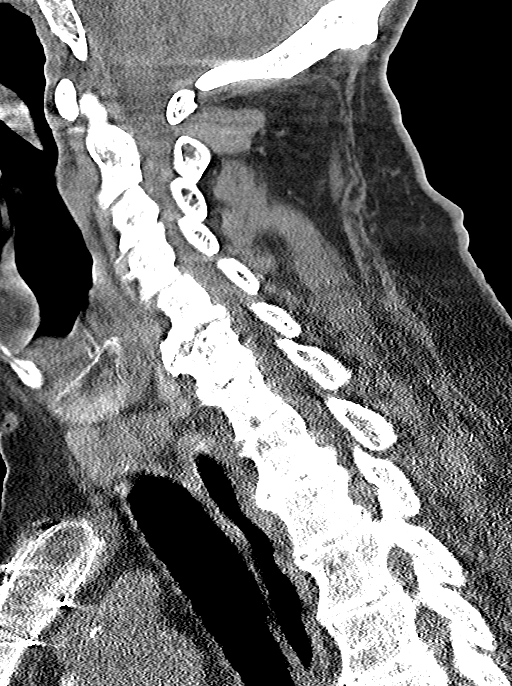
[im 27/54  bone]
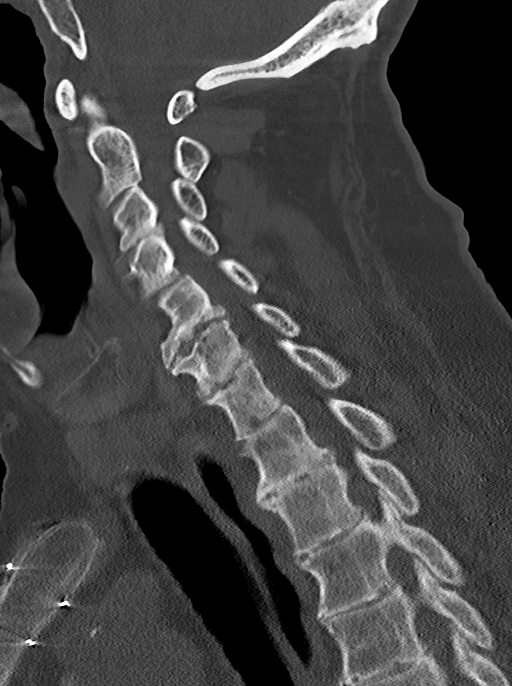
[im 31/54  bone]
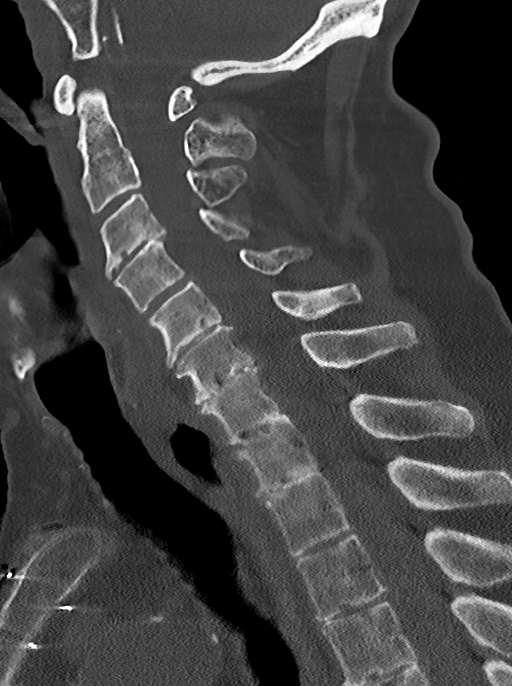
[im 36/54  bone]
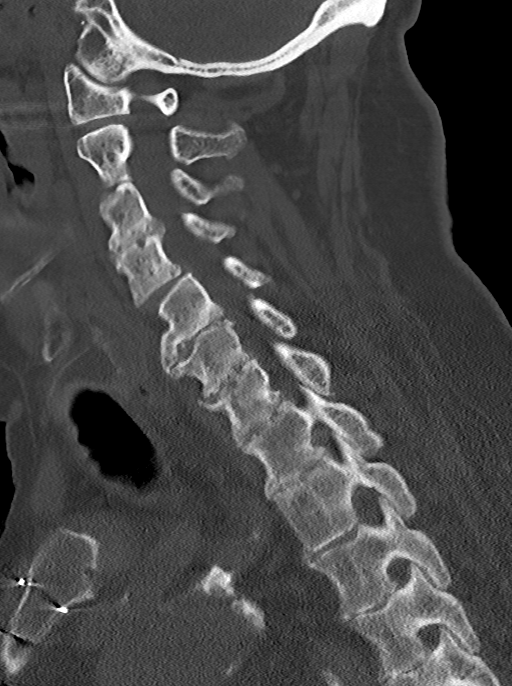

[Series 8: c_spine 2.0 cor bone · coronal · 0.29mm/px · 3 of 61 slices shown]
[im 13/61  bone]
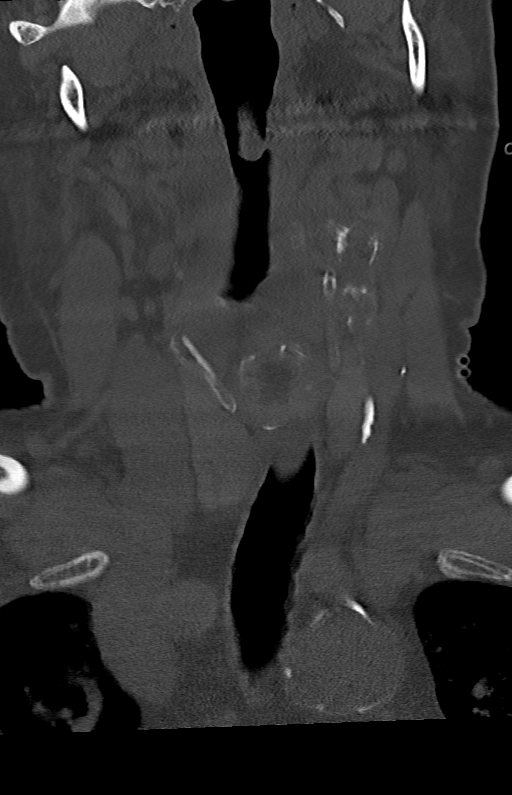
[im 25/61  bone]
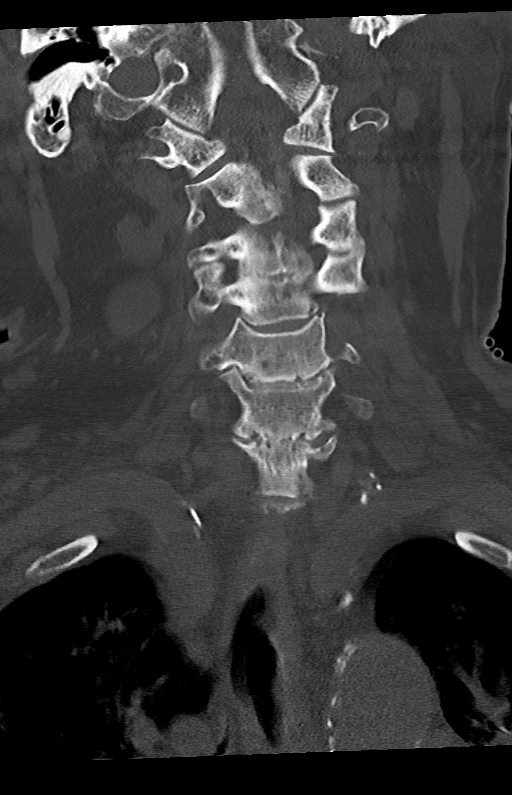
[im 37/61  bone]
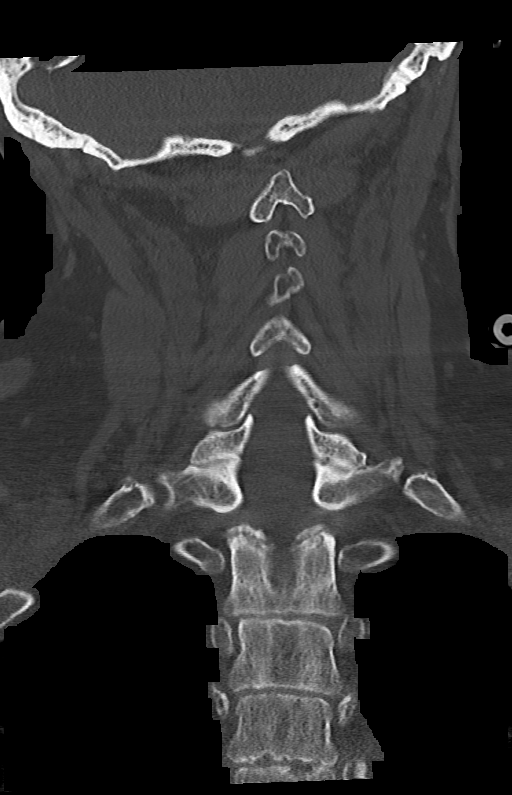

[Series 9: c_spine 2.0 orthogonals · axial · 0.21mm/px · z∈[-363,-280]mm · 3 of 109 slices shown]
[im 28/109  bone]
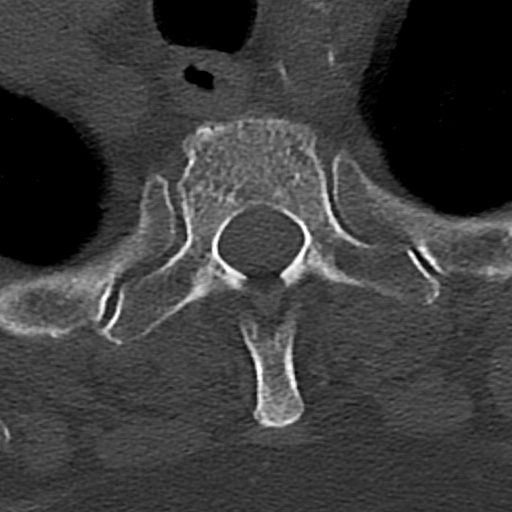
[im 55/109  bone]
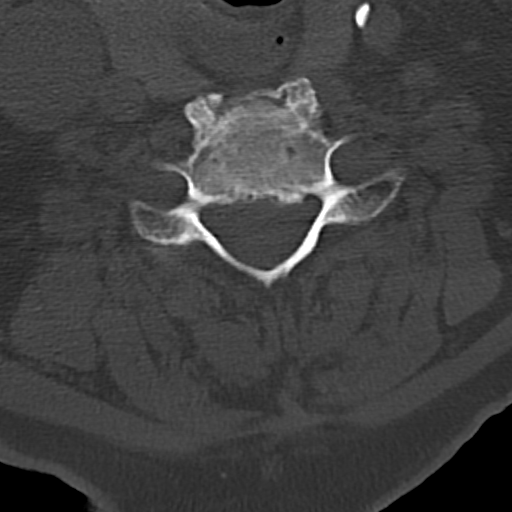
[im 82/109  bone]
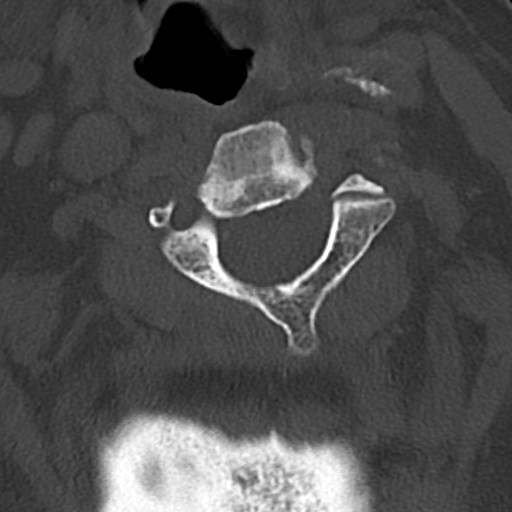

[13 of 33 positions shown; findings below may reference images not displayed]

FINDINGS: CT HEAD FINDINGS

Sinuses/Soft tissues: Mild left parietal scalp soft tissue
thickening on image/series [DATE].

Right maxillary sinus mucous retention cyst or polyp. No skull
fracture. Clear mastoid air cells.

Intracranial: Cerebral atrophy. Moderate low density in the
periventricular white matter likely related to small vessel disease.
Left cerebellar remote infarct. No mass lesion, hemorrhage,
hydrocephalus, acute infarct, intra-axial, or extra-axial fluid
collection.

CT CERVICAL SPINE FINDINGS

Spinal visualization through the bottom of T4. Prevertebral soft
tissues are within normal limits. Dense carotid atherosclerosis
bilaterally. No apical pneumothorax.

Advanced spondylosis. Left worse than right neural foraminal
narrowing at multiple levels. Mild central canal stenosis, most
significant at C3-4. Prior median sternotomy.

Maintenance of vertebral body height. Trace C3-4 retrolisthesis.
From C7 inferiorly are mildly degraded secondary to patient body
habitus. Facets are well-aligned. Coronal reformats demonstrate a
normal C1-C2 articulation..
IMPRESSION: 1. Mild left parietal scalp soft tissue swelling. No acute
intracranial abnormality.
2. Advanced cervical spondylosis. Trace C3-4 is favored to be
degenerative. Otherwise, no acute findings in the cervical spine
identified.
3.  Cerebral atrophy and small vessel ischemic change.
